# Patient Record
Sex: Female | Born: 1965 | Race: Black or African American | Hispanic: No | Smoking: Former smoker
Health system: Southern US, Community
[De-identification: ages and names within clinical notes are randomized; demographics above are authoritative.]

## PROBLEM LIST (undated history)

## (undated) DIAGNOSIS — D860 Sarcoidosis of lung: Secondary | ICD-10-CM

## (undated) DIAGNOSIS — F410 Panic disorder [episodic paroxysmal anxiety] without agoraphobia: Secondary | ICD-10-CM

## (undated) DIAGNOSIS — K209 Esophagitis, unspecified without bleeding: Secondary | ICD-10-CM

## (undated) DIAGNOSIS — M549 Dorsalgia, unspecified: Secondary | ICD-10-CM

## (undated) DIAGNOSIS — E669 Obesity, unspecified: Secondary | ICD-10-CM

## (undated) DIAGNOSIS — F419 Anxiety disorder, unspecified: Secondary | ICD-10-CM

## (undated) DIAGNOSIS — E785 Hyperlipidemia, unspecified: Secondary | ICD-10-CM

## (undated) DIAGNOSIS — N183 Chronic kidney disease, stage 3 unspecified: Secondary | ICD-10-CM

## (undated) DIAGNOSIS — K635 Polyp of colon: Secondary | ICD-10-CM

## (undated) DIAGNOSIS — G4730 Sleep apnea, unspecified: Secondary | ICD-10-CM

## (undated) DIAGNOSIS — F32A Depression, unspecified: Secondary | ICD-10-CM

## (undated) DIAGNOSIS — K219 Gastro-esophageal reflux disease without esophagitis: Secondary | ICD-10-CM

## (undated) DIAGNOSIS — R945 Abnormal results of liver function studies: Secondary | ICD-10-CM

## (undated) DIAGNOSIS — J449 Chronic obstructive pulmonary disease, unspecified: Secondary | ICD-10-CM

## (undated) DIAGNOSIS — I509 Heart failure, unspecified: Secondary | ICD-10-CM

## (undated) DIAGNOSIS — K2970 Gastritis, unspecified, without bleeding: Secondary | ICD-10-CM

## (undated) DIAGNOSIS — I10 Essential (primary) hypertension: Secondary | ICD-10-CM

## (undated) DIAGNOSIS — R51 Headache: Secondary | ICD-10-CM

## (undated) DIAGNOSIS — N83209 Unspecified ovarian cyst, unspecified side: Secondary | ICD-10-CM

## (undated) DIAGNOSIS — M797 Fibromyalgia: Secondary | ICD-10-CM

## (undated) DIAGNOSIS — D126 Benign neoplasm of colon, unspecified: Secondary | ICD-10-CM

## (undated) DIAGNOSIS — F329 Major depressive disorder, single episode, unspecified: Secondary | ICD-10-CM

## (undated) DIAGNOSIS — G8929 Other chronic pain: Secondary | ICD-10-CM

## (undated) DIAGNOSIS — K648 Other hemorrhoids: Secondary | ICD-10-CM

## (undated) DIAGNOSIS — R519 Headache, unspecified: Secondary | ICD-10-CM

## (undated) DIAGNOSIS — K760 Fatty (change of) liver, not elsewhere classified: Secondary | ICD-10-CM

## (undated) HISTORY — DX: Unspecified ovarian cyst, unspecified side: N83.209

## (undated) HISTORY — DX: Other chronic pain: G89.29

## (undated) HISTORY — DX: Headache: R51

## (undated) HISTORY — DX: Benign neoplasm of colon, unspecified: D12.6

## (undated) HISTORY — DX: Hyperlipidemia, unspecified: E78.5

## (undated) HISTORY — DX: Esophagitis, unspecified without bleeding: K20.90

## (undated) HISTORY — DX: Essential (primary) hypertension: I10

## (undated) HISTORY — DX: Abnormal results of liver function studies: R94.5

## (undated) HISTORY — DX: Fatty (change of) liver, not elsewhere classified: K76.0

## (undated) HISTORY — DX: Esophagitis, unspecified: K20.9

## (undated) HISTORY — DX: Other hemorrhoids: K64.8

## (undated) HISTORY — DX: Sleep apnea, unspecified: G47.30

## (undated) HISTORY — DX: Chronic obstructive pulmonary disease, unspecified: J44.9

## (undated) HISTORY — PX: OTHER SURGICAL HISTORY: SHX169

## (undated) HISTORY — PX: BACK SURGERY: SHX140

## (undated) HISTORY — DX: Gastro-esophageal reflux disease without esophagitis: K21.9

## (undated) HISTORY — DX: Headache, unspecified: R51.9

## (undated) HISTORY — PX: CARPAL TUNNEL RELEASE: SHX101

## (undated) HISTORY — DX: Fibromyalgia: M79.7

## (undated) HISTORY — DX: Polyp of colon: K63.5

## (undated) HISTORY — DX: Anxiety disorder, unspecified: F41.9

## (undated) HISTORY — DX: Sarcoidosis of lung: D86.0

## (undated) HISTORY — PX: DILATION AND CURETTAGE OF UTERUS: SHX78

## (undated) HISTORY — DX: Gastritis, unspecified, without bleeding: K29.70

## (undated) HISTORY — DX: Heart failure, unspecified: I50.9

---

## 2008-01-02 ENCOUNTER — Encounter: Payer: Self-pay | Admitting: Pulmonary Disease

## 2008-01-03 ENCOUNTER — Encounter: Payer: Self-pay | Admitting: Pulmonary Disease

## 2008-05-15 ENCOUNTER — Encounter: Payer: Self-pay | Admitting: Pulmonary Disease

## 2008-09-25 LAB — CONVERTED CEMR LAB

## 2009-09-25 HISTORY — PX: POLYPECTOMY: SHX149

## 2009-11-18 ENCOUNTER — Encounter: Payer: Self-pay | Admitting: Pulmonary Disease

## 2009-11-23 LAB — HM COLONOSCOPY

## 2009-12-03 DIAGNOSIS — D126 Benign neoplasm of colon, unspecified: Secondary | ICD-10-CM

## 2009-12-03 HISTORY — DX: Benign neoplasm of colon, unspecified: D12.6

## 2010-01-03 ENCOUNTER — Emergency Department (HOSPITAL_COMMUNITY): Admission: EM | Admit: 2010-01-03 | Discharge: 2010-01-04 | Payer: Self-pay | Admitting: Emergency Medicine

## 2010-01-19 ENCOUNTER — Encounter: Payer: Self-pay | Admitting: Pulmonary Disease

## 2010-01-19 ENCOUNTER — Encounter: Admission: RE | Admit: 2010-01-19 | Discharge: 2010-01-19 | Payer: Self-pay | Admitting: Cardiovascular Disease

## 2010-01-24 ENCOUNTER — Encounter: Payer: Self-pay | Admitting: Pulmonary Disease

## 2010-03-01 ENCOUNTER — Encounter: Admission: RE | Admit: 2010-03-01 | Discharge: 2010-03-01 | Payer: Self-pay | Admitting: Cardiovascular Disease

## 2010-03-01 ENCOUNTER — Inpatient Hospital Stay (HOSPITAL_COMMUNITY): Admission: AD | Admit: 2010-03-01 | Discharge: 2010-03-11 | Payer: Self-pay | Admitting: Cardiovascular Disease

## 2010-03-06 LAB — CONVERTED CEMR LAB
ALT: 28 units/L
AST: 29 units/L
Albumin: 3.4 g/dL
Alkaline Phosphatase: 135 units/L
BUN: 10 mg/dL
CO2: 25 meq/L
Calcium: 8.8 mg/dL
Chloride: 104 meq/L
Cholesterol: 189 mg/dL
Creatinine, Ser: 1.03 mg/dL
Glucose, Bld: 113 mg/dL
HDL: 79 mg/dL
LDL Cholesterol: 92 mg/dL
Potassium: 4 meq/L
Sodium: 135 meq/L
TSH: 0.637 microintl units/mL
Total Bilirubin: 0.6 mg/dL
Total Protein: 7.3 g/dL
Triglyceride fasting, serum: 89 mg/dL

## 2010-03-07 ENCOUNTER — Encounter: Payer: Self-pay | Admitting: Pulmonary Disease

## 2010-04-06 ENCOUNTER — Encounter: Payer: Self-pay | Admitting: Pulmonary Disease

## 2010-04-26 ENCOUNTER — Ambulatory Visit: Payer: Self-pay | Admitting: Pulmonary Disease

## 2010-04-26 DIAGNOSIS — F411 Generalized anxiety disorder: Secondary | ICD-10-CM | POA: Insufficient documentation

## 2010-04-26 DIAGNOSIS — D649 Anemia, unspecified: Secondary | ICD-10-CM | POA: Insufficient documentation

## 2010-04-26 DIAGNOSIS — D869 Sarcoidosis, unspecified: Secondary | ICD-10-CM

## 2010-04-26 DIAGNOSIS — I10 Essential (primary) hypertension: Secondary | ICD-10-CM

## 2010-04-26 DIAGNOSIS — G4730 Sleep apnea, unspecified: Secondary | ICD-10-CM | POA: Insufficient documentation

## 2010-04-26 DIAGNOSIS — R05 Cough: Secondary | ICD-10-CM

## 2010-04-26 DIAGNOSIS — R519 Headache, unspecified: Secondary | ICD-10-CM | POA: Insufficient documentation

## 2010-04-26 DIAGNOSIS — R0989 Other specified symptoms and signs involving the circulatory and respiratory systems: Secondary | ICD-10-CM

## 2010-04-26 DIAGNOSIS — E785 Hyperlipidemia, unspecified: Secondary | ICD-10-CM

## 2010-04-26 DIAGNOSIS — R51 Headache: Secondary | ICD-10-CM

## 2010-04-26 DIAGNOSIS — R0609 Other forms of dyspnea: Secondary | ICD-10-CM

## 2010-04-27 ENCOUNTER — Ambulatory Visit: Payer: Self-pay | Admitting: Cardiology

## 2010-04-29 ENCOUNTER — Telehealth (INDEPENDENT_AMBULATORY_CARE_PROVIDER_SITE_OTHER): Payer: Self-pay | Admitting: *Deleted

## 2010-05-09 ENCOUNTER — Encounter
Admission: RE | Admit: 2010-05-09 | Discharge: 2010-08-07 | Payer: Self-pay | Admitting: Physical Medicine & Rehabilitation

## 2010-05-09 ENCOUNTER — Telehealth (INDEPENDENT_AMBULATORY_CARE_PROVIDER_SITE_OTHER): Payer: Self-pay | Admitting: *Deleted

## 2010-05-16 ENCOUNTER — Ambulatory Visit (HOSPITAL_COMMUNITY)
Admission: RE | Admit: 2010-05-16 | Discharge: 2010-05-16 | Payer: Self-pay | Admitting: Physical Medicine & Rehabilitation

## 2010-05-16 ENCOUNTER — Ambulatory Visit: Payer: Self-pay | Admitting: Physical Medicine & Rehabilitation

## 2010-05-16 ENCOUNTER — Emergency Department (HOSPITAL_COMMUNITY): Admission: EM | Admit: 2010-05-16 | Discharge: 2010-05-16 | Payer: Self-pay | Admitting: Emergency Medicine

## 2010-05-18 ENCOUNTER — Ambulatory Visit: Payer: Self-pay | Admitting: Pulmonary Disease

## 2010-05-25 ENCOUNTER — Telehealth: Payer: Self-pay | Admitting: Pulmonary Disease

## 2010-06-06 ENCOUNTER — Ambulatory Visit: Payer: Self-pay | Admitting: Pulmonary Disease

## 2010-06-10 ENCOUNTER — Encounter: Payer: Self-pay | Admitting: Pulmonary Disease

## 2010-06-22 ENCOUNTER — Ambulatory Visit: Payer: Self-pay | Admitting: Pulmonary Disease

## 2010-06-22 ENCOUNTER — Inpatient Hospital Stay (HOSPITAL_COMMUNITY): Admission: AD | Admit: 2010-06-22 | Discharge: 2010-06-28 | Payer: Self-pay | Admitting: Cardiovascular Disease

## 2010-06-22 ENCOUNTER — Encounter: Admission: RE | Admit: 2010-06-22 | Discharge: 2010-06-22 | Payer: Self-pay | Admitting: Cardiovascular Disease

## 2010-06-22 ENCOUNTER — Encounter: Payer: Self-pay | Admitting: Internal Medicine

## 2010-07-05 ENCOUNTER — Telehealth: Payer: Self-pay | Admitting: Pulmonary Disease

## 2010-07-13 ENCOUNTER — Encounter: Payer: Self-pay | Admitting: Internal Medicine

## 2010-07-18 ENCOUNTER — Telehealth: Payer: Self-pay | Admitting: Pulmonary Disease

## 2010-07-27 ENCOUNTER — Telehealth (INDEPENDENT_AMBULATORY_CARE_PROVIDER_SITE_OTHER): Payer: Self-pay | Admitting: *Deleted

## 2010-07-27 ENCOUNTER — Ambulatory Visit: Payer: Self-pay | Admitting: Pulmonary Disease

## 2010-08-03 ENCOUNTER — Encounter: Payer: Self-pay | Admitting: Pulmonary Disease

## 2010-08-15 ENCOUNTER — Encounter: Payer: Self-pay | Admitting: Pulmonary Disease

## 2010-08-16 ENCOUNTER — Telehealth (INDEPENDENT_AMBULATORY_CARE_PROVIDER_SITE_OTHER): Payer: Self-pay | Admitting: *Deleted

## 2010-08-17 ENCOUNTER — Ambulatory Visit: Payer: Self-pay

## 2010-08-17 ENCOUNTER — Ambulatory Visit: Payer: Self-pay | Admitting: Pulmonary Disease

## 2010-08-17 DIAGNOSIS — J9610 Chronic respiratory failure, unspecified whether with hypoxia or hypercapnia: Secondary | ICD-10-CM

## 2010-08-18 ENCOUNTER — Emergency Department (HOSPITAL_COMMUNITY): Admission: EM | Admit: 2010-08-18 | Discharge: 2010-08-18 | Payer: Self-pay | Admitting: Pediatrics

## 2010-08-18 LAB — CONVERTED CEMR LAB
Basophils Relative: 1 %
Eosinophils Relative: 7 %
HCT: 41.9 %
Hemoglobin: 13.5 g/dL
Lymphocytes, automated: 27 %
MCV: 78 fL
Monocytes Relative: 14 %
Neutrophils Relative %: 52 %
Platelets: 158 10*3/uL
RBC: 5.37 M/uL
RDW: 17.2 %
WBC: 4.3 10*3/uL

## 2010-08-26 ENCOUNTER — Ambulatory Visit: Payer: Self-pay | Admitting: Internal Medicine

## 2010-08-26 DIAGNOSIS — N912 Amenorrhea, unspecified: Secondary | ICD-10-CM | POA: Insufficient documentation

## 2010-08-26 DIAGNOSIS — J309 Allergic rhinitis, unspecified: Secondary | ICD-10-CM | POA: Insufficient documentation

## 2010-08-26 DIAGNOSIS — F17200 Nicotine dependence, unspecified, uncomplicated: Secondary | ICD-10-CM | POA: Insufficient documentation

## 2010-08-26 DIAGNOSIS — M797 Fibromyalgia: Secondary | ICD-10-CM | POA: Insufficient documentation

## 2010-08-26 DIAGNOSIS — I5033 Acute on chronic diastolic (congestive) heart failure: Secondary | ICD-10-CM

## 2010-08-26 DIAGNOSIS — K219 Gastro-esophageal reflux disease without esophagitis: Secondary | ICD-10-CM | POA: Insufficient documentation

## 2010-08-31 ENCOUNTER — Ambulatory Visit: Payer: Self-pay | Admitting: Pulmonary Disease

## 2010-09-08 ENCOUNTER — Ambulatory Visit: Payer: Self-pay | Admitting: Internal Medicine

## 2010-09-09 LAB — CONVERTED CEMR LAB
BUN: 12 mg/dL (ref 6–23)
CO2: 25 meq/L (ref 19–32)
Calcium: 8.8 mg/dL (ref 8.4–10.5)
Chloride: 101 meq/L (ref 96–112)
Creatinine, Ser: 0.9 mg/dL (ref 0.4–1.2)
GFR calc non Af Amer: 86.53 mL/min (ref >60.00)
Glucose, Bld: 93 mg/dL (ref 70–99)
Potassium: 4 meq/L (ref 3.5–5.1)
Sodium: 139 meq/L (ref 135–145)

## 2010-09-13 ENCOUNTER — Telehealth: Payer: Self-pay | Admitting: Pulmonary Disease

## 2010-09-14 ENCOUNTER — Telehealth: Payer: Self-pay | Admitting: Internal Medicine

## 2010-09-20 ENCOUNTER — Telehealth: Payer: Self-pay | Admitting: Internal Medicine

## 2010-09-22 ENCOUNTER — Ambulatory Visit: Admit: 2010-09-22 | Payer: Self-pay | Admitting: Pulmonary Disease

## 2010-09-30 ENCOUNTER — Emergency Department (HOSPITAL_COMMUNITY)
Admission: EM | Admit: 2010-09-30 | Discharge: 2010-09-30 | Payer: Self-pay | Source: Home / Self Care | Admitting: Emergency Medicine

## 2010-09-30 ENCOUNTER — Encounter: Payer: Self-pay | Admitting: Pulmonary Disease

## 2010-09-30 LAB — CONVERTED CEMR LAB
Creatinine, Ser: 1 mg/dL
Glucose, Bld: 93 mg/dL
Neutrophils Relative %: 55 %
Platelets: 147 10*3/uL
RBC: 5.85 M/uL
RDW: 16.4 %
Sodium: 138 meq/L

## 2010-10-10 LAB — POCT I-STAT, CHEM 8
BUN: 10 mg/dL (ref 6–23)
Calcium, Ion: 0.96 mmol/L — ABNORMAL LOW (ref 1.12–1.32)
Chloride: 103 mEq/L (ref 96–112)
Creatinine, Ser: 1 mg/dL (ref 0.4–1.2)
Glucose, Bld: 93 mg/dL (ref 70–99)
HCT: 45 % (ref 36.0–46.0)
Hemoglobin: 15.3 g/dL — ABNORMAL HIGH (ref 12.0–15.0)
Potassium: 3.5 mEq/L (ref 3.5–5.1)
Sodium: 138 mEq/L (ref 135–145)
TCO2: 30 mmol/L (ref 0–100)

## 2010-10-10 LAB — POCT CARDIAC MARKERS
CKMB, poc: 1.1 ng/mL (ref 1.0–8.0)
Myoglobin, poc: 116 ng/mL (ref 12–200)
Troponin i, poc: <0.05 ng/mL (ref 0.00–0.09)

## 2010-10-10 LAB — CBC
HCT: 45.2 % (ref 36.0–46.0)
Hemoglobin: 14.7 g/dL (ref 12.0–15.0)
MCH: 25.1 pg — ABNORMAL LOW (ref 26.0–34.0)
MCHC: 32.5 g/dL (ref 30.0–36.0)
MCV: 77.3 fL — ABNORMAL LOW (ref 78.0–100.0)
Platelets: 147 10*3/uL — ABNORMAL LOW (ref 150–400)
RBC: 5.85 MIL/uL — ABNORMAL HIGH (ref 3.87–5.11)
RDW: 16.4 % — ABNORMAL HIGH (ref 11.5–15.5)
WBC: 4.2 10*3/uL (ref 4.0–10.5)

## 2010-10-10 LAB — DIFFERENTIAL
Basophils Absolute: 0 10*3/uL (ref 0.0–0.1)
Basophils Relative: 1 % (ref 0–1)
Eosinophils Absolute: 0.2 10*3/uL (ref 0.0–0.7)
Eosinophils Relative: 6 % — ABNORMAL HIGH (ref 0–5)
Lymphocytes Relative: 30 % (ref 12–46)
Lymphs Abs: 1.3 10*3/uL (ref 0.7–4.0)
Monocytes Absolute: 0.4 10*3/uL (ref 0.1–1.0)
Monocytes Relative: 8 % (ref 3–12)
Neutro Abs: 2.3 10*3/uL (ref 1.7–7.7)
Neutrophils Relative %: 55 % (ref 43–77)

## 2010-10-20 ENCOUNTER — Ambulatory Visit
Admission: RE | Admit: 2010-10-20 | Discharge: 2010-10-20 | Payer: Self-pay | Source: Home / Self Care | Attending: Internal Medicine | Admitting: Internal Medicine

## 2010-10-20 ENCOUNTER — Ambulatory Visit
Admission: RE | Admit: 2010-10-20 | Discharge: 2010-10-20 | Payer: Self-pay | Source: Home / Self Care | Attending: Pulmonary Disease | Admitting: Pulmonary Disease

## 2010-10-20 DIAGNOSIS — J441 Chronic obstructive pulmonary disease with (acute) exacerbation: Secondary | ICD-10-CM | POA: Insufficient documentation

## 2010-10-27 ENCOUNTER — Telehealth: Payer: Self-pay | Admitting: Internal Medicine

## 2010-10-27 NOTE — Progress Notes (Signed)
Summary: pna   Phone Note Call from Patient Call back at [phone]   Caller: Patient Call For: clance Summary of Call: pt have had pnuemonia3 times this year and she has it again. would like to talk to dr clance. Initial call taken by: Rickard Patience,  August 16, 2010 9:16 AM  Follow-up for Phone Call        Called, spoke with pt.  Per pt, she was seen at Urgent Care yesterday and was diagnosed with RLL PNA.  States she was given and injection and sent home with zpak and another abx.  Pt states she called her PCP but was told to call Dr. Shelle Iron as this is the 3rd time this year she has been diagnosed with pna.  Pt states she had a "hard time breathing last night."  OV offered today but pt declined d/t transportation issues.  Pt aware KC is out of the office today.  OV scheduled with [facility] for tomorrow morning at 11:15am -- pt aware and advised 911/ER is sxs worsen.  She verbalized understanding. Follow-up by: Gweneth Dimitri RN,  August 16, 2010 9:42 AM

## 2010-10-27 NOTE — Progress Notes (Signed)
Summary: medication, issue  Phone Note Call from Patient Call back at Home Phone [phone]   Caller: Patient Call For: clance Summary of Call: Pt states she was given diltiazem 60mg  by Dr.Kadakia on 8/12, and took her first dose on 8/13. C/O sob, thinks it may be a reaction to this med pls advise.//[email or web address] Initial call taken by: Darletta Moll,  May 09, 2010 3:28 PM  Follow-up for Phone Call        Spoke with pt; she is aware to call the dr that gave her the med and see what they recommend. If any urgent matters of SOB to address and she feels the need to go to the ER-then go.Reynaldo Minium CMA  May 09, 2010 3:58 PM

## 2010-10-27 NOTE — Letter (Signed)
SummaryTechnical sales engineer / Pediatric Administrator, arts / [facility]   Imported By: Lennie Odor 10/11/2010 10:49:49  _____________________________________________________________________  External Attachment:    Type:   Image     Comment:   External Document

## 2010-10-27 NOTE — Progress Notes (Signed)
Summary: nos appt  Phone Note Call from Patient   Caller: [email or web address]  Call For: clance Summary of Call: Rsc nos from 12/19 to 1/5. Initial call taken by: Darletta Moll,  September 13, 2010 12:52 PM

## 2010-10-27 NOTE — Miscellaneous (Signed)
Summary: Controlled Substances Contract / [facility]  Controlled Substances Contract / [facility]   Imported By: Lennie Odor 08/29/2010 15:08:43  _____________________________________________________________________  External Attachment:    Type:   Image     Comment:   External Document

## 2010-10-27 NOTE — Letter (Signed)
Summary: [facility]   Imported By: Lester [place] 05/13/2010 10:47:36  _____________________________________________________________________  External Attachment:    Type:   Image     Comment:   External Document

## 2010-10-27 NOTE — Letter (Signed)
Summary: [facility]  [facility]   Imported By: Lester [place] 05/05/2010 10:18:26  _____________________________________________________________________  External Attachment:    Type:   Image     Comment:   External Document

## 2010-10-27 NOTE — Letter (Signed)
Summary: CMN for Oxygen/[facility]  CMN for Oxygen/[facility]   Imported By: Sherian Rein 08/08/2010 11:42:11  _____________________________________________________________________  External Attachment:    Type:   Image     Comment:   External Document

## 2010-10-27 NOTE — Assessment & Plan Note (Signed)
Summary: rov for cough, copd, sarcoid   Copy to:  Dr. Anson Fret Primary Anival Pasha/Referring Zareth Rippetoe:  Dr. Anson Fret  CC:  Pt is here for a 4 week f/u appt.  Pt states she is still smoking less than 1/2 ppd.  Pt states breathing has imrpoved since switching from Advair to Symbicort.  Pt states she went to [facility] ED 2 days ago d/t increased coughing and was started on abx.  Pt states she will cough up thick clear sputum. Marland Kitchen  History of Present Illness: the pt comes in today for f/u of her known sarcoid, copd, and cough.  She has had a recent ct chest which showed no mediastinal LN, no ISLD, changes of emphysema, and a few scattered tiny pulmonary nodules.  At the last visit, she was changed to symbicort from advair to see if it would help her cough.  She felt it helped her breathing, but did not change her cough much.  She went to the ER recently for her cough, and was started on an abx emperically.  Currently, she is continuing to cough with clear mucus.  She is continuing to smoke 1/2 ppd.   Preventive Screening-Counseling & Management  Alcohol-Tobacco     Smoking Status: current     Smoking Cessation Counseling: yes     Packs/Day: 0.5     Tobacco Counseling: to quit use of tobacco products  Current Medications (verified): 1)  Albuterol Sulfate (2.5 Mg/33ml) 0.083% Nebu (Albuterol Sulfate) .... Qid As Needed 2)  Spiriva Handihaler 18 Mcg Caps (Tiotropium Bromide Monohydrate) .... Once Daily 3)  Symbicort 160-4.5 Mcg/act  Aero (Budesonide-Formoterol Fumarate) .... Two Puffs Twice Daily 4)  Ventolin Hfa 108 (90 Base) Mcg/act Aers (Albuterol Sulfate) .... As Needed 5)  Ferrous Sulfate 325 (65 Fe) Mg Tabs (Ferrous Sulfate) .... Take 1 Tablet By Mouth Two Times A Day 6)  Clonazepam 1 Mg Tabs (Clonazepam) .... Take 1 Tablet By Mouth Two Times A Day 7)  Klor-Con 10 10 Meq Cr-Tabs (Potassium Chloride) .... Take 1 Tablet By Mouth Two Times A Day 8)  Cyclobenzaprine Hcl 5 Mg Tabs (Cyclobenzaprine Hcl) ....  Take 1 Tablet By Mouth Three Times A Day 9)  Oxycodone-Acetaminophen 5-325 Mg Tabs (Oxycodone-Acetaminophen) .... Take 1 Tablet By Mouth Two Times A Day 10)  Omeprazole 40 Mg Cpdr (Omeprazole) .... Take 1 Tablet By Mouth Two Times A Day 11)  Furosemide 40 Mg Tabs (Furosemide) .... Take 1 Tablet By Mouth Two Times A Day 12)  Amoxicillin 875 Mg Tabs (Amoxicillin) .... Take 1 Tablet By Mouth Two Times A Day 13)  Tramadol Hcl 50 Mg Tabs (Tramadol Hcl) .... Take 1 Tablet By Mouth Three Times A Day 14)  Diltiazem Hcl 60 Mg Tabs (Diltiazem Hcl) .... Take 1 Tablet By Mouth Two Times A Day  Allergies (verified): 1)  ! Bactrim 2)  ! Doxycycline 3)  ! Zithromax 4)  Cipro 5)  * Latex  Past History:  Past medical, surgical, family and social histories (including risk factors) reviewed, and no changes noted (except as noted below).  Past Medical History: Reviewed history from 04/26/2010 and no changes required. HEADACHE, CHRONIC (ICD-784.0) SLEEP APNEA (ICD-780.57) HYPERLIPIDEMIA (ICD-272.4) ANXIETY (ICD-300.00) ANEMIA (ICD-285.9) PULMONARY SARCOIDOSIS (ICD-135) COPD (ICD-496)--?due to sarcoid, vs smoking. HYPERTENSION (ICD-401.9)  Past Surgical History: Reviewed history from 04/26/2010 and no changes required. polyp removed - 2011  Family History: Reviewed history from 04/26/2010 and no changes required. emphysema-father allergies-brother heart disease-mother, father, brother stomach cancer-father, brother  Social History: Reviewed  history from 04/26/2010 and no changes required. Patient is a current smoker.  1ppd x 40yrs.  Currently down to <1/2ppd. lives with sister seperated no children disability Packs/Day:  0.5  Review of Systems       The patient complains of productive cough, chest pain, nasal congestion/difficulty breathing through nose, and hand/feet swelling.  The patient denies shortness of breath with activity, shortness of breath at rest, non-productive cough,  coughing up blood, irregular heartbeats, acid heartburn, indigestion, loss of appetite, weight change, abdominal pain, difficulty swallowing, sore throat, tooth/dental problems, headaches, sneezing, itching, ear ache, anxiety, depression, joint stiffness or pain, rash, change in color of mucus, and fever.    Vital Signs:  Patient profile:   45 year old female Height:      68 inches Weight:      284.38 pounds BMI:     43.40 O2 Sat:      90 % on Room air Temp:     98.7 degrees F oral Pulse rate:   84 / minute BP sitting:   120 / 72  (left arm) Cuff size:   large  Vitals Entered By: Arman Filter LPN (May 18, 2010 10:39 AM)  O2 Flow:  Room air  CC: Pt is here for a 4 week f/u appt.  Pt states she is still smoking less than 1/2 ppd.  Pt states breathing has imrpoved since switching from Advair to Symbicort.  Pt states she went to [facility] ED 2 days ago d/t increased coughing and was started on abx.  Pt states she will cough up thick clear sputum.  Comments Medications reviewed with patient Arman Filter LPN  May 18, 2010 10:45 AM    Physical Exam  General:  obese female in nad Nose:  no purulence or obstruction, +turbinate hypertrophy Lungs:  poor depth of inspiration, o/w clear Heart:  rrr, no mrg Extremities:  mild ankle edema, no cyanosis  Neurologic:  alert and oriented, moves all 4.   Impression & Recommendations:  Problem # 1:  PULMONARY SARCOIDOSIS (ICD-135) the pt has no significant LN or ISLD to suggest active sarcoid.  Problem # 2:  COPD (ICD-496) the pt has known obstructive airways disease and continues to smoke.  I have stressed to her the need to quit smoking, and to continue with her bronchodilator regimen.  Will schedule for full pfts to compare to those 2 years ago.  Problem # 3:  COUGH (ICD-786.2) the pt continues to have a cough, but I think it is from her upper airway and ongoing smoking.  She is having significant postnasal drip, and will treat this more  aggressively.  I do not think this is due to airway disease from her sarcoid.  Medications Added to Medication List This Visit: 1)  Symbicort 160-4.5 Mcg/act Aero (Budesonide-formoterol fumarate) .... Two puffs twice daily 2)  Cyclobenzaprine Hcl 5 Mg Tabs (Cyclobenzaprine hcl) .... Take 1 tablet by mouth three times a day 3)  Amoxicillin 875 Mg Tabs (Amoxicillin) .... Take 1 tablet by mouth two times a day 4)  Tramadol Hcl 50 Mg Tabs (Tramadol hcl) .... Take 1 tablet by mouth three times a day 5)  Diltiazem Hcl 60 Mg Tabs (Diltiazem hcl) .... Take 1 tablet by mouth two times a day 6)  Zyrtec Allergy 10 Mg Tabs (Cetirizine hcl) .... Take 1 tablet by mouth once a day  Other Orders: Est. Patient Level IV (14782) Tobacco use cessation intermediate 3-10 minutes (95621) Pulmonary Referral (Pulmonary)  Patient Instructions:  1)  stay on symbicort and spiriva 2)  start zyrtec 10mg  over the counter each night at bedtime for postnasal drip. 3)  trial of omnaris 2 sprays each nostril each am ot see if it will help postnasal drip.  If it does, can call in prescription. 4)  stop smoking...this is the best treatment for your cough 5)  work on weight loss and conditioning. 6)  will schedule for breathing tests, and will let you know the results. 7)  followup with me in 3 mos, or sooner if problems.  Prescriptions: ZYRTEC ALLERGY 10 MG TABS (CETIRIZINE HCL) Take 1 tablet by mouth once a day  #30 x 3   Entered by:   Arman Filter LPN   Authorized by:   Barbaraann Share MD   Signed by:   Arman Filter LPN on 16/06/9603   Method used:   Print then Give to Patient   RxID:   [id number] SYMBICORT 160-4.5 MCG/ACT  AERO (BUDESONIDE-FORMOTEROL FUMARATE) Two puffs twice daily  #1 x 3   Entered by:   Arman Filter LPN   Authorized by:   Barbaraann Share MD   Signed by:   Arman Filter LPN on 21/30/8657   Method used:   Print then Give to Patient   RxID:   [id number]

## 2010-10-27 NOTE — Progress Notes (Signed)
Summary: scooter forms  Phone Note Other Incoming Call back at [phone] ext [phone]   Caller: kathy//[employer] Summary of Call: Wants to know if [facility] received forms for pt's scooter. Initial call taken by: Darletta Moll,  July 27, 2010 2:34 PM  Follow-up for Phone Call        called and spoke with [facility] and informed her that [facility] does not fill out scooter forms that they need to go to the pt's  pcp. She stated she understood and will fax forms to pcp.  Carver Fila  July 27, 2010 3:22 PM

## 2010-10-27 NOTE — Miscellaneous (Signed)
Summary: Orders Update  Clinical Lists Changes  Orders: Added new Test order of Venous Duplex Lower Extremity (Venous Duplex Lower) - Signed  Appended Document: Orders Update I have received a flag noting that LE dopplers negative for blood clots.  please let pt know.  increase the furosemide as I directed, and need a followup with your primary md to adjust dose further.  Appended Document: Orders Update called and spoke with patient and she verbalized understanding and had no questions

## 2010-10-27 NOTE — Progress Notes (Signed)
Summary: med ?  Phone Note Call from Patient Call back at Home Phone [phone]   Caller: Patient Summary of Call: Pt is looking for MD's advisement on whether or not she can using the nicotine patch along with taking Chantix-please advise Initial call taken by: Brenton Grills CMA Duncan Dull),  September 14, 2010 4:27 PM  Follow-up for Phone Call        may have increase n/v or headaches if taking both at same time - try using one or the other - thanks Follow-up by: Newt Lukes MD,  September 14, 2010 4:40 PM  Additional Follow-up for Phone Call Additional follow up Details #1::        left message of MD's advisement on VM Additional Follow-up by: Brenton Grills CMA Duncan Dull),  September 15, 2010 8:17 AM

## 2010-10-27 NOTE — Assessment & Plan Note (Signed)
Summary: acute sick visit for sob and ?pna   Primary Provider/Referring Provider:  Dr. Algie Coffer  CC:  Acute Visit.  Was seen at UC x 2 days ago for RLL pna.  Was reccomended by PCP to f/u with pulmonary as this is the 3rd time this year she has been diagnosed with pna.  increased SOB, nonprod cough, achy pains in chest, hoarseness, sweats, and chills - onset this past weekend.  Cough and SOB worse when laying down.  Requesting new nebulizer.  History of Present Illness: the pt comes in today for an acute sick visit.  She has known sarcoidosis, although her ct this year shows no ISLD and no significant LN.  She also has moderate airflow obstruction that I suspect is due to emphysema, and possibly OHS.  She comes in today after being evaluated at urgent care 2 days ago and told she had RLL pna.  However, her xray is not available to me currently, and they did not have xrays for comparison.  The pt has been having worsening sob, cough, sweats, and chills.  No purulence noted.  She currently is on omnicef and zpak per urgent care visit.  She does feel better, but is still sob.  It should be noted that her weight is up 8 pounds since her visit 3 weeks ago, and she has significant LE edema.  Her sats today are quite poor, but she is not wearing her oxygen as prescribed.  Current Medications (verified): 1)  Albuterol Sulfate (2.5 Mg/26ml) 0.083% Nebu (Albuterol Sulfate) .... Qid As Needed 2)  Spiriva Handihaler 18 Mcg Caps (Tiotropium Bromide Monohydrate) .... Once Daily 3)  Symbicort 160-4.5 Mcg/act  Aero (Budesonide-Formoterol Fumarate) .... Two Puffs Twice Daily 4)  Ventolin Hfa 108 (90 Base) Mcg/act Aers (Albuterol Sulfate) .... As Needed 5)  Klor-Con 10 10 Meq Cr-Tabs (Potassium Chloride) .... Take 1 Tablet By Mouth Two Times A Day 6)  Oxycodone-Acetaminophen 5-325 Mg Tabs (Oxycodone-Acetaminophen) .... Take 1 Tablet By Mouth Two Times A Day 7)  Omeprazole 40 Mg Cpdr (Omeprazole) .... Take 1 Capsule By  Mouth Once A Day 8)  Furosemide 40 Mg Tabs (Furosemide) .... Take 1 Tablet By Mouth Once A Day 9)  Diltiazem Hcl 60 Mg Tabs (Diltiazem Hcl) .... Take 1 Tablet By Mouth Two Times A Day 10)  Zyrtec Allergy 10 Mg Tabs (Cetirizine Hcl) .... Take 1 Tablet By Mouth Once A Day 11)  Xanax 0.5 Mg Tabs (Alprazolam) .Marland Kitchen.. 1 Tablet Two Times A Day 12)  Cefdinir 300 Mg Caps (Cefdinir) .... Take 1 Capsule By Mouth Two Times A Day 13)  Zithromax Z-Pak 250 Mg Tabs (Azithromycin) .... As Directed  Allergies (verified): 1)  ! Bactrim 2)  ! Doxycycline 3)  ! Zithromax 4)  Cipro 5)  * Latex  Past History:  Past medical, surgical, family and social histories (including risk factors) reviewed, and no changes noted (except as noted below).  Past Medical History: HEADACHE, CHRONIC (ICD-784.0) SLEEP APNEA (ICD-780.57) HYPERLIPIDEMIA (ICD-272.4) ANXIETY (ICD-300.00) ANEMIA (ICD-285.9) PULMONARY SARCOIDOSIS (ICD-135)--unimpressive ct chest 2011 COPD (ICD-496)--moderate airflow obstruction, suspect is due to emphysema HYPERTENSION (ICD-401.9)  Past Surgical History: Reviewed history from 04/26/2010 and no changes required. polyp removed - 2011  Family History: Reviewed history from 04/26/2010 and no changes required. emphysema-father allergies-brother heart disease-mother, father, brother stomach cancer-father, brother  Social History: Reviewed history from 04/26/2010 and no changes required. Patient is a current smoker.  1ppd x 78yrs.  Currently down to <1/2ppd. lives with sister seperated  no children disability  Review of Systems       The patient complains of shortness of breath with activity, shortness of breath at rest, non-productive cough, chest pain, acid heartburn, weight change, abdominal pain, difficulty swallowing, headaches, nasal congestion/difficulty breathing through nose, itching, anxiety, hand/feet swelling, joint stiffness or pain, and rash.  The patient denies productive  cough, coughing up blood, irregular heartbeats, indigestion, loss of appetite, sore throat, tooth/dental problems, sneezing, ear ache, depression, change in color of mucus, and fever.    Vital Signs:  Patient profile:   45 year old female Height:      68.5 inches Weight:      304.50 pounds BMI:     45.79 O2 Sat:      75 % on Room air Temp:     98.1 degrees F oral Pulse rate:   95 / minute BP sitting:   114 / 86  (right arm) Cuff size:   large  Vitals Entered By: Gweneth Dimitri RN (August 17, 2010 11:26 AM)  O2 Flow:  Room air  O2 Sat Comments Pt arrived to exam room with o2 sat 75% on RA.  o2 sat increased to 95% RA after resting with pulse of 86  Crystal Jones RN  August 17, 2010 11:29 AM  Pt was placed on 2L continuous o2 with sat 97% and pulse 85.  Gweneth Dimitri RN  August 17, 2010 11:35 AM   CC: Acute Visit.  Was seen at UC x 2 days ago for RLL pna.  Was reccomended by PCP to f/u with pulmonary as this is the 3rd time this year she has been diagnosed with pna.  increased SOB, nonprod cough, achy pains in chest, hoarseness, sweats, chills - onset this past weekend.  Cough and SOB worse when laying down.  Requesting new nebulizer Comments Medications reviewed with patient Daytime contact number verified with patient. Gweneth Dimitri RN  August 17, 2010 11:21 AM    Physical Exam  General:  morbidly obese female in nad Nose:  no discharge or purulence noted. Neck:  no jvd, tmg ,LN Lungs:  poor depth of inspiration, a few basilar crackles, no wheezing Heart:  rrr, 2/6 sem Extremities:  1-2+ edema bilat, some tenderness in calf no cyanosis  Neurologic:  alert and oriented, moves all 4.   Impression & Recommendations:  Problem # 1:  CHRONIC RESPIRATORY FAILURE (ICD-518.83) the pt has multifactorial respiratory failure.  She has known obstructive lung disease, morbid obesity with a probable element of OHS, diastolic dysfunction with some degree of pulmonary htn.  She  needs to be more compliant with her oxygen.  It is really unclear whether she had pna or not, and I am wondering if this episode is more of a diastolic chf issue given her worsening LE edema and 8 pound weight gain in a short period of time.  I do not have the xrays in my possession, and she is already taking abx. I have asked her to go ahead and fill these.  I will also increase her diuretic dose for a period of time to help with what appears to be volume overload.  I have also stressed to the pt the need to lose weight.  Problem # 2:  COPD (ICD-496) the pt has no evidence for an acute exacerbation or bronchospasm by exam and history today.  I have asked her to continue with her current meds.  Problem # 3:  PULMONARY SARCOIDOSIS (ICD-135) this is felt to be  a nonissue at this time.  Her ct in august showed no significant disease from sarcoid except nodules, and certainly nothing that would contribute to chronic respiratory failure.  Medications Added to Medication List This Visit: 1)  Omeprazole 40 Mg Cpdr (Omeprazole) .... Take 1 capsule by mouth once a day 2)  Furosemide 40 Mg Tabs (Furosemide) .... Take 1 tablet by mouth once a day 3)  Cefdinir 300 Mg Caps (Cefdinir) .... Take 1 capsule by mouth two times a day 4)  Zithromax Z-pak 250 Mg Tabs (Azithromycin) .... As directed  Other Orders: Est. Patient Level IV (04540) DME Referral (DME) Doppler Referral (Doppler)  Patient Instructions: 1)  take your fluid pill (lasix 40mg ) 2 each am instead of one for the next 3 days only.  Then go back to taking one a day. 2)  will do scan of your legs to look for blood clots.  I will call you with results. 3)  stay on oxygen all the time. 4)  finish up your antibiotics. 5)  followup with me in 2 weeks to check on your progress.  Please go by and pick up your disk at urgent care and bring to next visit.   Immunization History:  Influenza Immunization History:    Influenza:  historical  (05/26/2010)

## 2010-10-27 NOTE — Assessment & Plan Note (Signed)
Summary: requalify for O2   Allergies: 1)  ! Bactrim 2)  ! Doxycycline 3)  ! Zithromax 4)  Cipro 5)  * Latex   Other Orders: No Charge Patient Arrived (NCPA0) (NCPA0)      Ambulatory Pulse Oximetry  Resting; HR__81__    02 Sat__92ra__  Lap1 (185 feet)   HR__84__   02 Sat__81ra__ Lap2 (185 feet)   HR_____   02 Sat_____    Lap3 (185 feet)   HR_____   02 Sat_____  ___Test Completed without Difficulty _X_Test Stopped due to:  decreased oxygen saturations  pt walked 1/2 lap before o2 sats dropped to 81% ra; 79% by the time oxygen was provided.  placed patient on 2L/min continuous oxygen and allowed to rest x43minutes.  o2 sats increased to 95% on 2L/min cont oxygen. Boone Master CNA/MA  June 06, 2010 4:22 PM   per 8.31.11 phone note, Santina Evans with [facility] needed a qualifying sat.  qualifying sat obtained and faxed to [phone] attn Santina Evans. Boone Master CNA/MA  June 06, 2010 4:55 PM

## 2010-10-27 NOTE — Progress Notes (Signed)
Summary: letter of medical necessity  Phone Note Other Incoming Call back at [phone]   Caller: tara//[facility] Summary of Call: Need a letter of medical necessity  for O2. Initial call taken by: Darletta Moll,  July 18, 2010 2:05 PM  Follow-up for Phone Call        lmtcb x 1. Zackery Barefoot CMA  July 18, 2010 2:24 PM   Caller checking status of pt's [facility] paperwork. Has been faxing to [phone], I advised her to send to [phone]. Zackery Barefoot CMA  July 18, 2010 2:48 PM      Appended Document: letter of medical necessity pt needs oxygen only with exertion and HS.  Not at rest while sitting.

## 2010-10-27 NOTE — Miscellaneous (Signed)
Summary: pft results.  Clinical Lists Changes  pfts show moderate airflow obstruction, mild restriction, +airtrapping, and a very severe decrease in dlco Her spirometry is a little bit better than those done last year.  Appended Document: pft results. please let pt know that pfts stable to a little better from a year ago  Appended Document: pft results. lmomtcb x1  Appended Document: pft results. Pt called back Let pt know about  pft results. pt verbalized understanding.

## 2010-10-27 NOTE — Progress Notes (Signed)
  Phone Note Other Incoming   Request: Send information Action Taken: Software engineer of Call: Request for records received from [facility] [place], [place]. 24 pages forwarded to Dr. Shelle Iron for review.      Appended Document:  received records and put in [facility] very important look at folder for him to review.

## 2010-10-27 NOTE — Miscellaneous (Signed)
Summary: Orders Update pft charges  Clinical Lists Changes  Orders: Added new Service order of Carbon Monoxide diffusing w/capacity (94720) - Signed Added new Service order of Lung Volumes (94240) - Signed Added new Service order of Spirometry (Pre & Post) (94060) - Signed 

## 2010-10-27 NOTE — Assessment & Plan Note (Signed)
Summary: ER FU PER PT--COUGH CHEST CONGESTION  STC   Vital Signs:  Patient profile:   45 year old female Height:      68.5 inches (173.99 cm) Weight:      309.12 pounds (140.51 kg) O2 Sat:      90 % on 2 L/min Temp:     98.2 degrees F (36.78 degrees C) oral Pulse rate:   84 / minute BP sitting:   128 / 78  (left arm) Cuff size:   large  Vitals Entered By: Orlan Leavens RMA (October 20, 2010 10:44 AM)  O2 Flow:  2 L/min CC: ER F/U Is Patient Diabetic? No Pain Assessment Patient in pain? no        Primary Care Provider:  Newt Lukes MD  CC:  ER F/U.  History of Present Illness: here for ER f/u- seen 09/30/10 for SOB/wheeze tx zpak and pred pak - improved breathing but considerable swelling  also reviewed chronic med issues: 1) chronic hypoxic resp failure - home O2 dep since 2010 - overlap with OSA/OHS and obst dz per pulm - reports compliance with ongoing medical treatment and home O2 and no changes in medication dose or frequency. denies adverse side effects related to current therapy.  chronic dyspnea and congestion sensation - Quit smoking 08/2010!!  2) HTN - reports compliance with ongoing medical treatment and no changes in medication dose or frequency. denies adverse side effects related to current therapy and ?rash from dilt  resolved with change back to amlodipine  3) dCHF, chronic - echo 12/2009 reviewed - reports compliance with ongoing medical treatment and no changes in medication dose or frequency. denies adverse side effects related to current therapy. increased edema and dyspnea with resumed lasix  4) morbid obesity - weight gain reviewed - ??all fluid  5) anxiety - chronic hx same with daily BZ use - changed from klonopin to xanax 07/2010 due to "too sleepy" -   6) GERD - reports compliance with ongoing medical treatment and no changes in medication dose or frequency. denies adverse side effects related to current therapy.   6) dyslipidemia -started on  simva spring 2011- reports intermittent compliance with ongoing medical treatment and no changes in medication dose or frequency. denies adverse side effects related to current therapy.   7) fibromyalgia - chronic pain - on daily narcotics for years - would like to reduce dose if possible (reports on "10mg " while in [place] - now on "5" - but note prev hydrocod, now oxy) - pain affectes chest, back, legs and "whole body"  Preventive Screening-Counseling & Management  Alcohol-Tobacco     Smoking Status: quit < 6 months     Tobacco Counseling: not to resume use of tobacco products  Clinical Review Panels:  CBC   WBC:  4.2 (09/30/2010)   RBC:  5.85 (09/30/2010)   Hgb:  14.7 (09/30/2010)   Hct:  45.2 (09/30/2010)   Platelets:  147 (09/30/2010)   MCV  77.3 (09/30/2010)   RDW  16.4 (09/30/2010)   PMN:  55 (09/30/2010)   Monos:  8 (09/30/2010)   Eosinophils:  6 (09/30/2010)   Basophil:  1 (09/30/2010)  Complete Metabolic Panel   Glucose:  93 (09/30/2010)   Sodium:  138 (09/30/2010)   Potassium:  3.5 (09/30/2010)   Chloride:  103 (09/30/2010)   CO2:  30 (09/30/2010)   BUN:  10 (09/30/2010)   Creatinine:  1.0 (09/30/2010)   Albumin:  3.4 (03/06/2010)   Total Protein:  7.3 (03/06/2010)   Calcium:  8.8 (09/08/2010)   Total Bili:  0.6 (03/06/2010)   Alk Phos:  135 (03/06/2010)   SGPT (ALT):  28 (03/06/2010)   SGOT (AST):  29 (03/06/2010)   -  Date:  09/30/2010    BG Random: 93    BUN: 10    Creatinine: 1.0    Sodium: 138    Potassium: 3.5    Chloride: 103    CO2 Total: 30    WBC: 4.2    HGB: 14.7    HCT: 45.2    RBC: 5.85    PLT: 147    MCV: 77.3    RDW: 16.4    Neutrophil: 55    Lymphs: 30    Monos: 8    Eos: 6    Basophil: 1  Current Medications (verified): 1)  Albuterol Sulfate (2.5 Mg/51ml) 0.083% Nebu (Albuterol Sulfate) .... Qid As Needed 2)  Spiriva Handihaler 18 Mcg Caps (Tiotropium Bromide Monohydrate) .... Once Daily 3)  Symbicort 160-4.5 Mcg/act  Aero  (Budesonide-Formoterol Fumarate) .... Two Puffs Twice Daily 4)  Ventolin Hfa 108 (90 Base) Mcg/act Aers (Albuterol Sulfate) .... As Needed 5)  Klor-Con M20 20 Meq Cr-Tabs (Potassium Chloride Crys Cr) .Marland Kitchen.. 1 By Mouth Two Times A Day 6)  Oxycodone-Acetaminophen 5-325 Mg Tabs (Oxycodone-Acetaminophen) .... Take 1 Tablet By Mouth Two Times A Day 7)  Omeprazole 40 Mg Cpdr (Omeprazole) .... Take 1 Capsule By Mouth Once A Day 8)  Furosemide 40 Mg Tabs (Furosemide) .... Take 1 Tablet By Mouth Two Times A Day (Or As Directed) 9)  Norvasc 5 Mg Tabs (Amlodipine Besylate) .Marland Kitchen.. 1 By Mouth Once Daily 10)  Zyrtec Allergy 10 Mg Tabs (Cetirizine Hcl) .... Take 1 Tablet By Mouth Once A Day 11)  Xanax 0.5 Mg Tabs (Alprazolam) .Marland Kitchen.. 1 Tablet Two Times A Day 12)  Simvastatin 10 Mg Tabs (Simvastatin) .... Take 1 At Bedtime 13)  Flonase 50 Mcg/act Susp (Fluticasone Propionate) .... 2 Sprays Each Nostril  Every Morning 14)  Oxygen .... W/ Exertion and At Night 15)  Levaquin 750 Mg  Tabs (Levofloxacin) .... One Tablet By Mouth Daily 16)  Prednisone 10 Mg  Tabs (Prednisone) .... Take 4 Each Day For 2 Days, Then 3 Each Day For 2 Days, Then 2 Each Day For 2 Days, Then 1 Each Day For 2 Days, Then Stop  Allergies (verified): 1)  ! Bactrim 2)  ! Doxycycline 3)  ! 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  The patient denies fever, chest pain, syncope, and abdominal pain.    Physical Exam  General:  morbidly obese female in  nad Lungs:  poor depth of inspiration, a few basilar crackles, no wheezing Heart:  distant heart sounds but normal rate, regular rhythm, 2/6 syst murmur, and no rub. BLE with 2+ pitting edema.  Psych:  Oriented X3, memory intact for recent and remote, normally interactive, good eye contact, not anxious appearing, not depressed appearing, and not agitated.  Impression & Recommendations:  Problem # 1:  ACUTE ON CHRONIC DIASTOLIC HEART FAILURE (ICD-428.33)  16# weight gain and increease edema/dyspnea - double up lasix now - erx done - inc Kcl also advised daily weight check at home -  f/u 2 weeks, sooner if problems  Her updated medication list for this problem includes:    Furosemide 80 Mg Tabs (Furosemide) .Marland Kitchen... 1 by mouth two times a day until further notice  Orders: Prescription Created Electronically [phone])  Problem # 2:  CHRONIC OBSTRUCTIVE PULMONARY DISEASE, ACUTE EXACERBATION (ICD-491.21) per pulm today: the pt's description of symptoms is most c/w an acute copd exacerbation due to acute bronchitis.  She will need a course of abx (more broad spectrum than her zpak earlier in the month), as well as a prednisone taper.  At least she has quit smoking 4 weeks ago.  She is to continue on her oxygen and bronchodilators, and if fails to improve, will need hospitalization.  Complete Medication List: 1)  Albuterol Sulfate (2.5 Mg/25ml) 0.083% Nebu (Albuterol sulfate) .... Qid as needed 2)  Spiriva Handihaler 18 Mcg Caps (Tiotropium bromide monohydrate) .... Once daily 3)  Symbicort 160-4.5 Mcg/act Aero (Budesonide-formoterol fumarate) .... Two puffs twice daily 4)  Ventolin Hfa 108 (90 Base) Mcg/act Aers (Albuterol sulfate) .... As needed 5)  Klor-con M20 20 Meq Cr-tabs (Potassium chloride crys cr) .Marland Kitchen.. 1 by mouth three times a day 6)  Oxycodone-acetaminophen 5-325 Mg Tabs (Oxycodone-acetaminophen) .... Take 1 tablet by mouth two times a day - to fill 10/24/10 7)  Omeprazole 40 Mg Cpdr  (Omeprazole) .... Take 1 capsule by mouth once a day 8)  Furosemide 80 Mg Tabs (Furosemide) .Marland Kitchen.. 1 by mouth two times a day until further notice 9)  Norvasc 5 Mg Tabs (Amlodipine besylate) .Marland Kitchen.. 1 by mouth once daily 10)  Zyrtec Allergy 10 Mg Tabs (Cetirizine hcl) .... Take 1 tablet by mouth once a day 11)  Xanax 0.5 Mg Tabs (Alprazolam) .Marland Kitchen.. 1 tablet two times a day 12)  Simvastatin 10 Mg Tabs (Simvastatin) .... Take 1 at bedtime 13)  Flonase 50 Mcg/act Susp (Fluticasone propionate) .... 2 sprays each nostril  every morning 14)  Oxygen  .... W/ exertion and at night 15)  Levaquin 750 Mg Tabs (Levofloxacin) .... One tablet by mouth daily 16)  Prednisone 10 Mg Tabs (Prednisone) .... Take 4 each day for 2 days, then 3 each day for 2 days, then 2 each day for 2 days, then 1 each day for 2 days, then stop  Patient Instructions: 1)  it was good to see you today. 2)  ER records, labs and medications reviewed -  3)  increase furosemide to 80mg  two times a day until further notice and increase potassium to 3 pills per day 4)  your prescriptions have been electronically submitted to your pharmacy. Please take as directed. Contact our office if you believe you're having problems with the medication(s).  5)  printed pain pill prescription given to you today for 10/24/10 6)  Please schedule a follow-up appointment in 2 weeks to recheck weight and breathing, call sooner if problems.  7)  Congrats with being done smoking!! yea!!! 8)  if your symptoms continue to worsen (pain, trouble breathing, fever, etc), or if you are unable take anything by mouth (pills, fluids, etc), you should go to the emergency room for further evaluation and treatment.  Prescriptions: OXYCODONE-ACETAMINOPHEN 5-325 MG TABS (OXYCODONE-ACETAMINOPHEN) Take 1 tablet by mouth two times a day - to fill  10/24/10  #60 x 0   Entered and Authorized by:   Newt Lukes MD   Signed by:   Newt Lukes MD on 10/20/2010   Method used:    Print then Give to Patient   RxID:   [id number] FUROSEMIDE 80 MG TABS (FUROSEMIDE) 1 by mouth two times a day until further notice  #60 x 3   Entered and Authorized by:   Newt Lukes MD   Signed by:   Newt Lukes MD on 10/20/2010   Method used:   Electronically to        [facility] [address] [phone]* (retail)       [address]       [place], [place]  [address]       Ph: [phone] or [phone]       Fax: [phone]   RxID:   [id number]    Orders Added: 1)  Est. Patient Level IV [18841] 2)  Prescription Created Electronically [[id number]]      CXR  Procedure date:  09/30/2010  Findings:      Impression: chronic interstitial lung disease and bibasilar scarring again noted. This is not significantly change in appearance from previous exam

## 2010-10-27 NOTE — Assessment & Plan Note (Signed)
Summary: 1-2 WK FU  STC   Vital Signs:  Patient profile:   45 year old female Height:      68.5 inches (173.99 cm) Weight:      293.0 pounds (133.18 kg) O2 Sat:      94 % on 2 L/min Temp:     97.5 degrees F (36.39 degrees C) oral Pulse rate:   88 / minute BP sitting:   110 / 70  (left arm) Cuff size:   large  Vitals Entered By: Orlan Leavens RMA (September 08, 2010 10:46 AM)  O2 Flow:  2 L/min CC: 1-2 week follow-up Is Patient Diabetic? No Pain Assessment Patient in pain? no        Primary Care Provider:  Newt Lukes MD  CC:  1-2 week follow-up.  History of Present Illness: here for f/u-  1) chronic hypoxic resp failure - home O2 dep since 2010 - overlap with OSA/OHS and obst dz per pulm - reports compliance with ongoing medical treatment and home O2 and no changes in medication dose or frequency. denies adverse side effects related to current therapy.  chronic dyspnea and congestion sensation  2) HTN - reports compliance with ongoing medical treatment and no changes in medication dose or frequency. denies adverse side effects related to current therapy and ?rash from dilt  resolved with change back to amlodipine  3) dCHF, chronic - echo 12/2009 reviewed - reports compliance with ongoing medical treatment and no changes in medication dose or frequency. denies adverse side effects related to current therapy. improved edema and dyspnea with resumed lasix  4) morbid obesity -  5) anxiety - chronic hx same with daily BZ use - changed from klonopin to xanax 07/2010 due to "too sleepy" -   6) GERD - reports compliance with ongoing medical treatment and no changes in medication dose or frequency. denies adverse side effects related to current therapy.   6) dyslipidemia -started on simva spring 2011- reports intermittent compliance with ongoing medical treatment and no changes in medication dose or frequency. denies adverse side effects related to current therapy.   7)  fibromyalgia - chronic pain - on daily narcotics for years - would like to reduce dose if possible (reports on "10mg " while in [place] - now on "5" - but note prev hydrocod, now oxy) - pain affectes chest, back, legs and "whole body"  Clinical Review Panels:  Prevention   Last Mammogram:  No specific mammographic evidence of malignancy.   (09/25/2008)   Last Pap Smear:  Interpretation Result:Negative for intraepithelial Lesion or Malignancy.    (09/25/2008)   Last Colonoscopy:  Pt states was done in [facility]. Had 1 poylp removed (11/23/2009)  Immunizations   Last Flu Vaccine:  Historical (05/26/2010)   Last Pneumovax:  Historical (06/25/2006)  Lipid Management   Cholesterol:  189 (03/06/2010)   LDL (bad choesterol):  92 (03/06/2010)   HDL (good cholesterol):  79 (03/06/2010)   Triglycerides:  89 (03/06/2010)  CBC   WBC:  4.3 (08/18/2010)   RBC:  5.37 (08/18/2010)   Hgb:  13.5 (08/18/2010)   Hct:  41.9 (08/18/2010)   Platelets:  158 (08/18/2010)   MCV  78.0 (08/18/2010)   RDW  17.2 (08/18/2010)   PMN:  52 (08/18/2010)   Monos:  14 (08/18/2010)   Eosinophils:  7 (08/18/2010)   Basophil:  1 (08/18/2010)  Complete Metabolic Panel   Glucose:  113 (03/06/2010)   Sodium:  135 (03/06/2010)   Potassium:  4.0 (  03/06/2010)   Chloride:  104 (03/06/2010)   CO2:  25 (03/06/2010)   BUN:  10 (03/06/2010)   Creatinine:  1.03 (03/06/2010)   Albumin:  3.4 (03/06/2010)   Total Protein:  7.3 (03/06/2010)   Calcium:  8.8 (03/06/2010)   Total Bili:  0.6 (03/06/2010)   Alk Phos:  135 (03/06/2010)   SGPT (ALT):  28 (03/06/2010)   SGOT (AST):  29 (03/06/2010)   Current Medications (verified): 1)  Albuterol Sulfate (2.5 Mg/15ml) 0.083% Nebu (Albuterol Sulfate) .... Qid As Needed 2)  Spiriva Handihaler 18 Mcg Caps (Tiotropium Bromide Monohydrate) .... Once Daily 3)  Symbicort 160-4.5 Mcg/act  Aero (Budesonide-Formoterol Fumarate) .... Two Puffs Twice Daily 4)  Ventolin Hfa 108 (90  Base) Mcg/act Aers (Albuterol Sulfate) .... As Needed 5)  Klor-Con M20 20 Meq Cr-Tabs (Potassium Chloride Crys Cr) .Marland Kitchen.. 1 By Mouth Two Times A Day 6)  Oxycodone-Acetaminophen 5-325 Mg Tabs (Oxycodone-Acetaminophen) .... Take 1 Tablet By Mouth Two Times A Day 7)  Omeprazole 40 Mg Cpdr (Omeprazole) .... Take 1 Capsule By Mouth Once A Day 8)  Furosemide 40 Mg Tabs (Furosemide) .... Take 1 Tablet By Mouth Two Times A Day (Or As Directed) 9)  Norvasc 5 Mg Tabs (Amlodipine Besylate) .Marland Kitchen.. 1 By Mouth Once Daily 10)  Zyrtec Allergy 10 Mg Tabs (Cetirizine Hcl) .... Take 1 Tablet By Mouth Once A Day 11)  Xanax 0.5 Mg Tabs (Alprazolam) .Marland Kitchen.. 1 Tablet Two Times A Day 12)  Simvastatin 10 Mg Tabs (Simvastatin) .... Take 1 At Bedtime 13)  Flonase 50 Mcg/act Susp (Fluticasone Propionate) .... 2 Sprays Each Nostril  Every Morning  Allergies (verified): 1)  ! Bactrim 2)  ! Doxycycline 3)  ! 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    Physical Exam  General:  morbidly obese female in nad Lungs:  poor depth of inspiration, a few basilar crackles, no wheezing Heart:  distant heart sounds but normal rate, regular rhythm, 2/6 syst murmur, and no rub. BLE with trace edema.  Psych:  Oriented X3, memory intact for recent and remote, normally interactive, good eye contact, not anxious appearing, not depressed appearing, and not agitated.      Impression & Recommendations:  Problem # 1:  ACUTE ON CHRONIC DIASTOLIC HEART FAILURE  (ICD-428.33)  Her updated medication list for this problem includes:    Furosemide 40 Mg Tabs (Furosemide) .Marland Kitchen... Take 1 tablet by mouth two times a day (or as directed)  Orders: TLB-BMP (Basic Metabolic Panel-BMET) (80048-METABOL)  improved - weight loss reviewed and less DOE -  reviewed echo 12/2009 report in EMR - normal LVEF with grade 1 diast dysfx; also mod pulm htn cont same lasix - check lytes now consider need for further cardiac eval after review of prior records  Problem # 2:  HYPERTENSION (ICD-401.9)  Her updated medication list for this problem includes:    Furosemide 40 Mg Tabs (Furosemide) .Marland Kitchen... Take 1 tablet by mouth two times a day (or as directed)    Norvasc 5 Mg Tabs (Amlodipine besylate) .Marland Kitchen... 1 by mouth  once daily  Orders: TLB-BMP (Basic Metabolic Panel-BMET) (80048-METABOL)  BP today: 110/70 Prior BP: 128/62 (08/26/2010)  Labs Reviewed: K+: 4.0 (03/06/2010) Creat: : 1.03 (03/06/2010)   Chol: 189 (03/06/2010)   HDL: 79 (03/06/2010)   LDL: 92 (03/06/2010)   TG: 89 (03/06/2010)  Problem # 3:  CHRONIC RESPIRATORY FAILURE (ICD-518.83)  Orders: TLB-BMP (Basic Metabolic Panel-BMET) (80048-METABOL) Tobacco use cessation intermediate 3-10 minutes (99406)  home O2 since 2010 - multifact - obst dz, MO with OHS, OSA recent pulm OV and ER visit 08/18/10 reviewed -  no wheeze - tx chronic dCHF as above - meds extensively reviewed encouraged compliance with CPAP and to quit smoking 100%  Problem # 4:  SMOKER (ICD-305.1) 5 minutes today spent on patient education regarding the unhealthy effects of continued tobacco abuse and encouragment of cessation including medical options available to help patient to quit smoking.  Her updated medication list for this problem includes:    Chantix Starting Month Pak 0.5 Mg X 11 & 1 Mg X 42 Tabs (Varenicline tartrate) .Marland Kitchen... As directed  Orders: TLB-BMP (Basic Metabolic Panel-BMET) (80048-METABOL) Tobacco use cessation intermediate  3-10 minutes (16109) Prescription Created Electronically [phone])  Complete Medication List: 1)  Albuterol Sulfate (2.5 Mg/56ml) 0.083% Nebu (Albuterol sulfate) .... Qid as needed 2)  Spiriva Handihaler 18 Mcg Caps (Tiotropium bromide monohydrate) .... Once daily 3)  Symbicort 160-4.5 Mcg/act Aero (Budesonide-formoterol fumarate) .... Two puffs twice daily 4)  Ventolin Hfa 108 (90 Base) Mcg/act Aers (Albuterol sulfate) .... As needed 5)  Klor-con M20 20 Meq Cr-tabs (Potassium chloride crys cr) .Marland Kitchen.. 1 by mouth two times a day 6)  Oxycodone-acetaminophen 5-325 Mg Tabs (Oxycodone-acetaminophen) .... Take 1 tablet by mouth two times a day 7)  Omeprazole 40 Mg Cpdr (Omeprazole) .... Take 1 capsule by mouth once a day 8)  Furosemide 40 Mg Tabs (Furosemide) .... Take 1 tablet by mouth two times a day (or as directed) 9)  Norvasc 5 Mg Tabs (Amlodipine besylate) .Marland Kitchen.. 1 by mouth once daily 10)  Zyrtec Allergy 10 Mg Tabs (Cetirizine hcl) .... Take 1 tablet by mouth once a day 11)  Xanax 0.5 Mg Tabs (Alprazolam) .Marland Kitchen.. 1 tablet two times a day 12)  Simvastatin 10 Mg Tabs (Simvastatin) .... Take 1 at bedtime 13)  Flonase 50 Mcg/act Susp (Fluticasone propionate) .... 2 sprays each nostril  every morning 14)  Chantix Starting Month Pak 0.5 Mg X 11 & 1 Mg X 42 Tabs (Varenicline tartrate) .... As directed  Patient Instructions: 1)  it was good to see you today. 2)  medications reviewed - start chantix - no other changes for now 3)  continue furosemide to two times a day until further notice 4)  your prescriptions have been electronically submitted to your pharmacy. Please take as directed. Contact our office if you believe you're having problems with the medication(s).  5)  refills of these medications will be provided by this office as needed according to the controlled substances contract 6)  test(s) ordered today - your results will be posted on the phone tree for review in 48-72 hours from the time of test  completion; call [phone] and enter your 9 digit MRN (listed above on this page, just below your name); if any changes need to be made or there are abnormal results, you will be contacted directly. 7)  Please schedule a follow-up appointment in  3 months to recheck weight and breathing, call sooner if problems.  8)  Tobacco is very bad for  your health and your loved ones! You Should stop smoking! good luck with the chantix Prescriptions: CHANTIX STARTING MONTH PAK 0.5 MG X 11 & 1 MG X 42 TABS (VARENICLINE TARTRATE) as directed  #1 x 0   Entered and Authorized by:   Newt Lukes MD   Signed by:   Newt Lukes MD on 09/08/2010   Method used:   Electronically to        [facility] [address] [phone]* (retail)       [address]       [place], [place]  [address]       Ph: [phone] or [phone]       Fax: [phone]   RxID:   [id number]    Orders Added: 1)  TLB-BMP (Basic Metabolic Panel-BMET) [80048-METABOL] 2)  Est. Patient Level IV [56433] 3)  Tobacco use cessation intermediate 3-10 minutes [99406] 4)  Prescription Created Electronically [phone]

## 2010-10-27 NOTE — Assessment & Plan Note (Signed)
Summary: consult for sarcoid, dyspnea   Copy to:  Dr. Anson Fret Primary [facility]/Referring Hattie Aguinaldo:  Dr. Anson Fret  CC:  Pulmonary Consult.  Marland Kitchen  History of Present Illness: The pt is a 45y/o female who I have been asked to see for sarcoidosis and possible obstructive lung disease.  The pt was diagnosed with sarcoidosis in 1997 by her history with FOB.  The records for this are not avaialble.  She has been treated intermittantly since that time with prednisone, and has been followed by the pulmonary clinic at the [facility] [place] in the past.  She tells me that she has the diagnosis of "copd".  The pt has not had a ct chest in 2 yrs, and no full pfts in at least one year.  She did have spirometry in May which show mild airflow obstruction,and concomitant restrictive disease could not be excluded.  She has been on oxygen for 2 years, and wears with exertion only and as needed.  She also wears at HS.  She had an echo in FEB of this year which showed nl LV fxn, but the suggestion of impaired relaxation.  The report does mention an estimated elevation of RVSP, but nothing else is mentioned about pulmonary htn.  The pt did have an admission to a [facility] hospital in Mar of this year with MRSA bronchitis and possible copd exacerbation.  She was also diagnosed here with possible RLL pna, and f/u cxr revealed only bibasilar scarring.  The pt currently "feels full of fluid",and that her breathing is worse than usual baseline.  She will get winded with any moderate exertional activity, and also with bringing groceries in from the car.  She has no significant cough except when sick.  She does have known gerd, takes omeprazole.  Preventive Screening-Counseling & Management  Alcohol-Tobacco     Smoking Status: current  Current Medications (verified): 1)  Albuterol Sulfate (2.5 Mg/30ml) 0.083% Nebu (Albuterol Sulfate) .... Qid As Needed 2)  Ipratropium Bromide 0.02 % Soln (Ipratropium Bromide) .... Two Times A  Day 3)  Spiriva Handihaler 18 Mcg Caps (Tiotropium Bromide Monohydrate) .... Once Daily 4)  Advair Diskus 250-50 Mcg/dose Aepb (Fluticasone-Salmeterol) .... Inhale 1 Puff Two Times A Day 5)  Ventolin Hfa 108 (90 Base) Mcg/act Aers (Albuterol Sulfate) .... As Needed 6)  Ferrous Sulfate 325 (65 Fe) Mg Tabs (Ferrous Sulfate) .... Take 1 Tablet By Mouth Two Times A Day 7)  Amlodipine Besylate 5 Mg Tabs (Amlodipine Besylate) .... Take 1 Tablet By Mouth Once A Day 8)  Clonazepam 1 Mg Tabs (Clonazepam) .... Take 1 Tablet By Mouth Two Times A Day 9)  Klor-Con 10 10 Meq Cr-Tabs (Potassium Chloride) .... Take 1 Tablet By Mouth Two Times A Day 10)  Cyclobenzaprine Hcl 5 Mg Tabs (Cyclobenzaprine Hcl) .... Take 1 Tab By Mouth At Bedtime 11)  Oxycodone-Acetaminophen 5-325 Mg Tabs (Oxycodone-Acetaminophen) .... Take 1 Tablet By Mouth Two Times A Day 12)  Omeprazole 40 Mg Cpdr (Omeprazole) .... Take 1 Tablet By Mouth Two Times A Day 13)  Loratadine 10 Mg Tabs (Loratadine) .... Take 1 Tablet By Mouth Once A Day 14)  Furosemide 40 Mg Tabs (Furosemide) .... Take 1 Tablet By Mouth Two Times A Day 15)  Zocor 10 Mg Tabs (Simvastatin) .... Take 1 Tab By Mouth At Bedtime  Allergies (verified): 1)  ! Bactrim 2)  ! Doxycycline 3)  ! Zithromax 4)  Cipro 5)  * Latex  Past History:  Past Medical History: HEADACHE, CHRONIC (ICD-784.0)  SLEEP APNEA (ICD-780.57) HYPERLIPIDEMIA (ICD-272.4) ANXIETY (ICD-300.00) ANEMIA (ICD-285.9) PULMONARY SARCOIDOSIS (ICD-135) COPD (ICD-496)--?due to sarcoid, vs smoking. HYPERTENSION (ICD-401.9)  Past Surgical History: polyp removed - 2011  Family History: Reviewed history and no changes required. emphysema-father allergies-brother heart disease-mother, father, brother stomach cancer-father, brother  Social History: Reviewed history and no changes required. Patient is a current smoker.  1ppd x 21yrs.  Currently down to <1/2ppd. lives with sister seperated no  children disability Smoking Status:  current  Review of Systems       The patient complains of shortness of breath with activity, shortness of breath at rest, productive cough, non-productive cough, chest pain, acid heartburn, indigestion, weight change, tooth/dental problems, nasal congestion/difficulty breathing through nose, sneezing, anxiety, hand/feet swelling, and joint stiffness or pain.  The patient denies coughing up blood, irregular heartbeats, loss of appetite, abdominal pain, difficulty swallowing, sore throat, headaches, itching, ear ache, depression, rash, change in color of mucus, and fever.    Vital Signs:  Patient profile:   45 year old female Height:      68 inches Weight:      287.50 pounds BMI:     43.87 O2 Sat:      91 % on 2 L/mincont Temp:     97.7 degrees F oral Pulse rate:   86 / minute BP sitting:   122 / 80  (right arm) Cuff size:   large  Vitals Entered By: Gweneth Dimitri RN (April 26, 2010 10:59 AM)  O2 Flow:  2 L/mincont CC: Pulmonary Consult.   Comments Medications reviewed with patient Daytime contact number verified with patient. Gweneth Dimitri RN  April 26, 2010 11:00 AM    Physical Exam  General:  morbidly obese female in nad Eyes:  PERRLA and EOMI.   Nose:  mild deviation to left with narrowing.  mild erythema Mouth:  small posterior pharyngeal space, long palate and uvula Neck:  large, unable to assess for jvd no tmg, LN Lungs:  poor inspiratory effort, small lung volumes, basilar crackles, no wheezing Heart:  rrr, no mrg Abdomen:  soft and nontender, bs+ Extremities:  2+ edema bilat, no cyanosis Neurologic:  alert and oriented, moves all 4.   Impression & Recommendations:  Problem # 1:  DYSPNEA ON EXERTION (ICD-786.09) I suspect this is multifactorial, and due to her morbid obesity and deconditioning, her obstructive lung disease, fluid overload issues that I suspect are due to diastolic dysfunction, and finally ? due to her  sarcoid.  I have asked her to work on weight loss and deconditioning, to stop smoking, and to continue on her bronchodilator regimen.  She will also need to follow closely with Dr. Algie Coffer for her fluid issues.  We will also evaluate her sarcoid to see how much of an issue this may be.    Problem # 2:  COPD (ICD-496) the pt has documented airflow obstruction that may be related to chronic asthmatic bronchitis due to smoking, her sarcoid, but doubt emphysema given her age.  I would like to simplify her medical regimen, and change advair to symbicort given what sounds like an upper airway cough.  Problem # 3:  PULMONARY SARCOIDOSIS (ICD-135) Unclear how much of an impact this is having on her pulmonary symptoms.  Will check ct chest and full pfts  Problem # 4:  COUGH (ICD-786.2) Her cough sounds more upper airway than lower, and I am concerned about LPR and her advair diskus.  I cannot r/o that her airways disease from  ongoing smoking or possibly her sarcoid could be playing a role.  Medications Added to Medication List This Visit: 1)  Albuterol Sulfate (2.5 Mg/58ml) 0.083% Nebu (Albuterol sulfate) .... Qid as needed 2)  Ipratropium Bromide 0.02 % Soln (Ipratropium bromide) .... Two times a day 3)  Spiriva Handihaler 18 Mcg Caps (Tiotropium bromide monohydrate) .... Once daily 4)  Advair Diskus 250-50 Mcg/dose Aepb (Fluticasone-salmeterol) .... Inhale 1 puff two times a day 5)  Ventolin Hfa 108 (90 Base) Mcg/act Aers (Albuterol sulfate) .... As needed 6)  Ferrous Sulfate 325 (65 Fe) Mg Tabs (Ferrous sulfate) .... Take 1 tablet by mouth two times a day 7)  Amlodipine Besylate 5 Mg Tabs (Amlodipine besylate) .... Take 1 tablet by mouth once a day 8)  Clonazepam 1 Mg Tabs (Clonazepam) .... Take 1 tablet by mouth two times a day 9)  Klor-con 10 10 Meq Cr-tabs (Potassium chloride) .... Take 1 tablet by mouth two times a day 10)  Cyclobenzaprine Hcl 5 Mg Tabs (Cyclobenzaprine hcl) .... Take 1 tab by  mouth at bedtime 11)  Oxycodone-acetaminophen 5-325 Mg Tabs (Oxycodone-acetaminophen) .... Take 1 tablet by mouth two times a day 12)  Omeprazole 40 Mg Cpdr (Omeprazole) .... Take 1 tablet by mouth two times a day 13)  Loratadine 10 Mg Tabs (Loratadine) .... Take 1 tablet by mouth once a day 14)  Furosemide 40 Mg Tabs (Furosemide) .... Take 1 tablet by mouth two times a day 15)  Zocor 10 Mg Tabs (Simvastatin) .... Take 1 tab by mouth at bedtime  Other Orders: Consultation Level V [id number]) Radiology Referral (Radiology)  Patient Instructions: 1)  will check ct chest to evaluate your lung disease 2)  stop ipratroprium in your nebulizer...don't need to take this 3)  use albuterol neb treatments just for rescue...if you want to take one in the am to start your day, that is ok. 4)  will change advair to symbicort 160/4.5  2 inhalations each am and pm....rinse mouth well. 5)  continue on spiriva one inhalation each am. 6)  will need to have breathing tests done at some point...will wait until you are closer to your usual baseline.  7)  make sure you contact Dr. Algie Coffer if you continue to hold onto fluid 8)  followup with me in 4 weeks.   Immunization History:  Influenza Immunization History:    Influenza:  historical (06/25/2009)

## 2010-10-27 NOTE — Progress Notes (Signed)
Summary: nos appt  Phone Note Call from Patient   Caller: [email or web address]  Call For: clance Summary of Call: Rsc nos from 10/10 to 11/2. Initial call taken by: Darletta Moll,  July 05, 2010 10:42 AM

## 2010-10-27 NOTE — Assessment & Plan Note (Signed)
Summary: New / Medicare,Medicaid/#/cd   Vital Signs:  Patient profile:   45 year old female Height:      68.5 inches (173.99 cm) Weight:      306.8 pounds (139.45 kg) O2 Sat:      93 % on 2 L/min Temp:     98.7 degrees F (37.06 degrees C) oral Pulse rate:   86 / minute BP sitting:   128 / 62  (left arm) Cuff size:   large  Vitals Entered By: Orlan Leavens RMA (August 26, 2010 9:15 AM)  O2 Flow:  2 L/min CC: New patient Is Patient Diabetic? No Pain Assessment Patient in pain? no        Primary Care Provider:  Newt Lukes MD  CC:  New patient.  History of Present Illness: new pt to me nd our divison, known to pulm - here to est care prev followed with Algie Coffer for PCP needs  1) chronic hypoxic resp failure - home O2 dep since 2010 - overlap with OSA/OHS and obst dz per pulm - reports compliance with ongoing medical treatment and home O2 and no changes in medication dose or frequency. denies adverse side effects related to current therapy.  chronic dyspnea and congestion sensation  2) HTN - reports compliance with ongoing medical treatment and no changes in medication dose or frequency. denies adverse side effects related to current therapy but ?rash since starting dilt in place of amlodipine 2 mo ago  3) dCHF, chronic - echo 12/2009 reviewed - reports compliance with ongoing medical treatment and no changes in medication dose or frequency. denies adverse side effects related to current therapy.  has run out of lasix since increased dose rec by pulm last week - continued edema and dyspnea  4) morbid obesity -  5) anxiety - chronic hx same with daily BZ use - changed from klonopin to xanax 07/2010 due to "too sleepy" -   6) GERD - reports compliance with ongoing medical treatment and no changes in medication dose or frequency. denies adverse side effects related to current therapy.   6) dyslipidemia -started on simva spring 2011- reports intermittent compliance with  ongoing medical treatment and no changes in medication dose or frequency. denies adverse side effects related to current therapy.   7) fibromyalgia - chronic pain - on daily narcotics for years - would like to reduce dose if possible (reports on "10mg " while in [place] - now on "5" - but note prev hydrocod, now oxy) - pain affectes chest, back, legs and "whole body"  Preventive Screening-Counseling & Management  Alcohol-Tobacco     Alcohol drinks/day: <1     Alcohol Counseling: not indicated; use of alcohol is not excessive or problematic     Smoking Status: current     Smoking Cessation Counseling: yes     Packs/Day: 0.25     Tobacco Counseling: to quit use of tobacco products  Caffeine-Diet-Exercise     Does Patient Exercise: no     Exercise Counseling: to improve exercise regimen     Depression Counseling: not indicated; screening negative for depression  Safety-Violence-Falls     Seat Belt Counseling: not indicated; patient wears seat belts     Helmet Use: n/a     Firearms in the Home: no firearms in the home     Smoke Detectors: yes     Violence in the Home: no risk noted     Violence Counseling: not indicated; no violence risk noted  Fall Risk Counseling: not indicated; no significant falls noted  Clinical Review Panels:  Prevention   Last Mammogram:  No specific mammographic evidence of malignancy.   (09/25/2008)   Last Pap Smear:  Interpretation Result:Negative for intraepithelial Lesion or Malignancy.    (09/25/2008)   Last Colonoscopy:  Pt states was done in [facility]. Had 1 poylp removed (11/23/2009)   -  Date:  08/18/2010    WBC: 4.3    HGB: 13.5    HCT: 41.9    RBC: 5.37    PLT: 158    MCV: 78.0    RDW: 17.2    Neutrophil: 52    Lymphs: 27    Monos: 14    Eos: 7    Basophil: 1  Date:  03/06/2010    TSH: 0.637    BG Random: 113    BUN: 10    Creatinine: 1.03    Sodium: 135    Potassium: 4.0    Chloride: 104    CO2 Total: 25    SGOT  (AST): 29    SGPT (ALT): 28    T. Bilirubin: 0.6    Alk Phos: 135    Calcium: 8.8    Total Protein: 7.3    Albumin: 3.4    Cholesterol: 189    LDL: 92    HDL: 79    Triglycerides: 89  Current Medications (verified): 1)  Albuterol Sulfate (2.5 Mg/24ml) 0.083% Nebu (Albuterol Sulfate) .... Qid As Needed 2)  Spiriva Handihaler 18 Mcg Caps (Tiotropium Bromide Monohydrate) .... Once Daily 3)  Symbicort 160-4.5 Mcg/act  Aero (Budesonide-Formoterol Fumarate) .... Two Puffs Twice Daily 4)  Ventolin Hfa 108 (90 Base) Mcg/act Aers (Albuterol Sulfate) .... As Needed 5)  Klor-Con 10 10 Meq Cr-Tabs (Potassium Chloride) .... Take 1 Tablet By Mouth Two Times A Day 6)  Oxycodone-Acetaminophen 5-325 Mg Tabs (Oxycodone-Acetaminophen) .... Take 1 Tablet By Mouth Two Times A Day 7)  Omeprazole 40 Mg Cpdr (Omeprazole) .... Take 1 Capsule By Mouth Once A Day 8)  Furosemide 40 Mg Tabs (Furosemide) .... Take 1 Tablet By Mouth Once A Day 9)  Diltiazem Hcl 60 Mg Tabs (Diltiazem Hcl) .... Take 1 Tablet By Mouth Two Times A Day 10)  Zyrtec Allergy 10 Mg Tabs (Cetirizine Hcl) .... Take 1 Tablet By Mouth Once A Day 11)  Xanax 0.5 Mg Tabs (Alprazolam) .Marland Kitchen.. 1 Tablet Two Times A Day 12)  Simvastatin 10 Mg Tabs (Simvastatin) .... Take 1 At Bedtime  Allergies (verified): 1)  ! Bactrim 2)  ! Doxycycline 3)  ! Zithromax 4)  Cipro 5)  * Latex  Past History:  Past Medical History: HEADACHE, CHRONIC  fibromyalgia - daily narcotics SLEEP APNEA, noncompliant w/ CPAP HYPERLIPIDEMIA ANXIETY - chronic BZ use ANEMIA PULMONARY SARCOIDOSIS--unimpressive ct chest 2011 COPD --moderate airflow obstruction, suspect is due to emphysema HYPERTENSION Asthma Allergic rhinitis Colonic polyps, hx GERD  MD roster: pulm - clance  Past Surgical History: polyp removed - 2011   Family History: emphysema-father allergies-brother heart disease-mother, father, brother stomach cancer-father, brother  dad expired age 84 -  stomach ca mom expired age 22 - dementia  Social History: Patient is a current smoker.  1ppd x 39yrs.   Currently down to <1/2ppd. lives alone single no children disability Packs/Day:  0.25 Does Patient Exercise:  no  Review of Systems       c/o no period in >1 year - not sexually active, no spotting, +c/o episodic suprapubic cramping at times "like  a period w/o bleeding";' see HPI above. I have reviewed all other systems and they were negative.   Physical Exam  General:  morbidly obese female in nad Head:  Normocephalic and atraumatic without obvious abnormalities. No apparent alopecia or balding. Eyes:  vision grossly intact; pupils equal, round and reactive to light.  conjunctiva and lids normal.    Ears:  normal pinnae bilaterally, without erythema, swelling, or tenderness to palpation. TMs clear, without effusion, or cerumen impaction. Hearing grossly normal bilaterally  Nose:  no discharge or purulence noted. Mouth:  teeth and gums in good repair; mucous membranes moist, without lesions or ulcers. oropharynx clear without exudate, no erythema.  Neck:  thick, supple, full ROM, no masses, no thyromegaly; no thyroid nodules or tenderness. no JVD or carotid bruits.   Lungs:  poor depth of inspiration, a few basilar crackles, no wheezing Heart:  distant heart sounds but normal rate, regular rhythm, 2/6 syst murmur, and no rub. BLE with 1+ edema.  Abdomen:  obese, soft, non-tender, normal bowel sounds, no distention; no masses and no appreciable hepatomegaly or splenomegaly.   Genitalia:  defer to gyn Msk:  No deformity or scoliosis noted of thoracic or lumbar spine.   Neurologic:  alert & oriented X3 and cranial nerves II-XII symetrically intact.  strength normal in all extremities, sensation intact to light touch, and gait normal. speech fluent without dysarthria or aphasia; follows commands with good comprehension.  Skin:  raised nodular rash beneath tatoo on left ant chest and  right forearm - no other skin changes and tatoo on ext surface of right forearm is flat/smooth Psych:  Oriented X3, memory intact for recent and remote, normally interactive, good eye contact, not anxious appearing, not depressed appearing, and not agitated.      Impression & Recommendations:  Problem # 1:  HYPERTENSION (ICD-401.9)  ?rash induced by dilt - change back to prior norvasc - send for prior records from former PCP Her updated medication list for this problem includes:    Furosemide 40 Mg Tabs (Furosemide) .Marland Kitchen... Take 1 tablet by mouth two times a day (or as directed)    Norvasc 5 Mg Tabs (Amlodipine besylate) .Marland Kitchen... 1 by mouth once daily  BP today: 128/62 Prior BP: 114/86 (08/17/2010)  Orders: Prescription Created Electronically [phone])  Problem # 2:  ACUTE ON CHRONIC DIASTOLIC HEART FAILURE (ICD-428.33)  continued weight gain with DOE - last pulm note reviewed as well as echo 12/2009 report in EMR - normal LVEF with grade 1 diast dysfx; also mod pulm htn complicated by running out of lasix - refill now at rx'd two times a day dose - also inc lasix recheck weight in 1-2 weeks and reck labs - consider need for further cardiac eval after review of prior records Her updated medication list for this problem includes:    Furosemide 40 Mg Tabs (Furosemide) .Marland Kitchen... Take 1 tablet by mouth two times a day (or as directed)  Orders: Prescription Created Electronically [phone])  Problem # 3:  CHRONIC RESPIRATORY FAILURE (ICD-518.83) home O2 since 2010 - multifact - obst dz, MO with OHS, OSA recent pulm OV and ER visit 08/18/10 reviewed -  no wheeze - tx dCHF as above - meds extensively reviewed encouraged compliance with CPAP and to quit smoking 100%  Problem # 4:  HYPERLIPIDEMIA (ICD-272.4)  Her updated medication list for this problem includes:    Simvastatin 10 Mg Tabs (Simvastatin) .Marland Kitchen... Take 1 at bedtime  Problem # 5:  ANXIETY (ICD-300.00) chronic  BZ use - but "sleepy" SE  - advised to use less as able Her updated medication list for this problem includes:    Xanax 0.5 Mg Tabs (Alprazolam) .Marland Kitchen... 1 tablet two times a day  Problem # 6:  MORBID OBESITY (ICD-278.01) advised need for weight loss due to health complications (resp failure, CHF, HTN) Ht: 68.5 (08/26/2010)   Wt: 306.8 (08/26/2010)   BMI: 45.79 (08/17/2010)  Problem # 7:  FIBROMYALGIA (ICD-729.1) chronic narc use reviewed as well as [place] controlled subst registry - contact signed today - pt agrees to efforts to reduce use overtime as able Her updated medication list for this problem includes:    Oxycodone-acetaminophen 5-325 Mg Tabs (Oxycodone-acetaminophen) .Marland Kitchen... Take 1 tablet by mouth two times a day  Problem # 8:  AMENORRHEA (ICD-626.0) not sexually active - not preg no period >26mo - may be menopause but needs gyn eval in setting of morbid obesity Orders: Gynecologic Referral (Gyn)   Time spent with patient , more than 50% of this time was spent counseling patient on chronic health issues, acute dyspnea symptoms and edema, med review and changes and need for close f/u - here, pulm and gyn - also plans to obtain old records and review of available prior labs/tests in hosp and OP EMR files  Complete Medication List: 1)  Albuterol Sulfate (2.5 Mg/95ml) 0.083% Nebu (Albuterol sulfate) .... Qid as needed 2)  Spiriva Handihaler 18 Mcg Caps (Tiotropium bromide monohydrate) .... Once daily 3)  Symbicort 160-4.5 Mcg/act Aero (Budesonide-formoterol fumarate) .... Two puffs twice daily 4)  Ventolin Hfa 108 (90 Base) Mcg/act Aers (Albuterol sulfate) .... As needed 5)  Klor-con M20 20 Meq Cr-tabs (Potassium chloride crys cr) .Marland Kitchen.. 1 by mouth two times a day 6)  Oxycodone-acetaminophen 5-325 Mg Tabs (Oxycodone-acetaminophen) .... Take 1 tablet by mouth two times a day 7)  Omeprazole 40 Mg Cpdr (Omeprazole) .... Take 1 capsule by mouth once a day 8)  Furosemide 40 Mg Tabs (Furosemide) .... Take 1  tablet by mouth two times a day (or as directed) 9)  Norvasc 5 Mg Tabs (Amlodipine besylate) .Marland Kitchen.. 1 by mouth once daily 10)  Zyrtec Allergy 10 Mg Tabs (Cetirizine hcl) .... Take 1 tablet by mouth once a day 11)  Xanax 0.5 Mg Tabs (Alprazolam) .Marland Kitchen.. 1 tablet two times a day 12)  Simvastatin 10 Mg Tabs (Simvastatin) .... Take 1 at bedtime 13)  Flonase 50 Mcg/act Susp (Fluticasone propionate) .... 2 sprays each nostril  every morning  Patient Instructions: 1)  it was good to see you today. 2)  medications reviewed and refilled as discussed 3)  change Diltazem back to norvasc - 4)  increase furosemide to two times a day until further notice 5)  your prescriptions have been electronically submitted to your pharmacy. Please take as directed. Contact our office if you believe you're having problems with the medication(s).  6)  send for records from dr. Algie Coffer 7)  reduce xanax and pain pill to 1/2 pill two times a day if possible as less of these medications will help your breathing 8)  refills of these medications will be provided by this office as needed according to the controlled substances contract 9)  we'll make referral to gynecologist. Our office will contact you regarding this appointment once made.  10)  Please schedule a follow-up appointment in 1-2 weeks to recehck weight and breathing, call sooner if problems.  11)  Tobacco is very bad for your health and your loved ones! You  Should stop smoking!. Prescriptions: KLOR-CON M20 20 MEQ CR-TABS (POTASSIUM CHLORIDE CRYS CR) 1 by mouth two times a day  #60 x 6   Entered and Authorized by:   Newt Lukes MD   Signed by:   Newt Lukes MD on 08/26/2010   Method used:   Electronically to        [facility] [address] [phone]* (retail)       [address]       [place], [place]  [address]       Ph: [phone] or [phone]       Fax: [phone]   RxID:   [id number] NORVASC 5 MG TABS (AMLODIPINE BESYLATE)  1 by mouth once daily  #30 x 6   Entered and Authorized by:   Newt Lukes MD   Signed by:   Newt Lukes MD on 08/26/2010   Method used:   Electronically to        [facility] [address] [phone]* (retail)       [address]       [place], [place]  [address]       Ph: [phone] or [phone]       Fax: [phone]   RxID:   [id number] FUROSEMIDE 40 MG TABS (FUROSEMIDE) Take 1 tablet by mouth two times a day (or as directed)  #60 x 6   Entered and Authorized by:   Newt Lukes MD   Signed by:   Newt Lukes MD on 08/26/2010   Method used:   Electronically to        [facility] [address] [phone]* (retail)       [address]       [place], [place]  [address]       Ph: [phone] or [phone]       Fax: [phone]   RxID:   [id number]    Orders Added: 1)  New Patient Level V [99205] 2)  Prescription Created Electronically [id number] 3)  Gynecologic Referral [Gyn]     Mammogram  Procedure date:  09/25/2008  Findings:      No specific mammographic evidence of malignancy.    Pap Smear  Procedure date:  09/25/2008  Findings:      Interpretation Result:Negative for intraepithelial Lesion or Malignancy.     Colonoscopy  Procedure date:  11/23/2009  Findings:      Pt states was done in [place]. Had 1 poylp removed

## 2010-10-27 NOTE — Progress Notes (Signed)
Summary: medicare qualifications  Phone Note Call from Patient Call back at [phone]   Caller: Pediatric & adult specialist - Santina Evans Call For: Clance Reason for Call: Talk to Nurse Summary of Call: 02 reading on room air for pt's oxygen for Medicare qualifications. Initial call taken by: Eugene Gavia,  May 25, 2010 3:23 PM  Follow-up for Phone Call        called spoke with Santina Evans who requested verification on pt's o2 sat with ambulation.  i informed catherine that a walking sat was not done, that the 90% she was told was pt's o2 with her routine VS during ov.  catherine okay with this.  she does request that the ov note be faxed to her attn @ [phone] to qualify pt for medicare.  requesting this within the week.  will forward to [facility]. Boone Master CNA/MA  May 25, 2010 4:26 PM   Additional Follow-up for Phone Call Additional follow up Details #1::        we are not supposed to fax notes to DME companies due to [employer].  I did not start this patient on oxygen, and they need to get qualifying sats from the office that started her oxygen. Additional Follow-up by: Barbaraann Share MD,  May 25, 2010 5:06 PM    Additional Follow-up for Phone Call Additional follow up Details #2::    LMTCB with co worker to call the office in the morning as she has left for the day; knows to ask for someone in triage.Reynaldo Minium CMA  May 25, 2010 5:09 PM   Spoke with Santina Evans and advised of recs per [facility].  She states that she has already spoken with Dr Algie Coffer regarding this issue b/c he is the one who started o2, but he wanted our office to provide most recent o2 sat.  She states that there is no way to bill medicare without o2 sat.  She states that she already spoke with Victorino Dike this am regarding this and was told that Victorino Dike would be speaking with our supervisor to see what can be done. Vernie Murders  May 26, 2010 4:07 PM  Per TD pt needs to be set to come in and requalify  for oxygen. Pt has appt on monday for pft  do walk at that time as well. Carron Curie CMA  June 01, 2010 3:26 PM

## 2010-10-27 NOTE — Progress Notes (Signed)
Summary: Oxycodone  Phone Note Call from Patient Call back at Home Phone [phone]   Caller: Patient Summary of Call: Pt called requesting refill of Oxycodone Initial call taken by: Margaret Pyle, CMA,  September 20, 2010 1:55 PM  Follow-up for Phone Call        ok - refill done - signed and given to triage b Follow-up by: Newt Lukes MD,  September 20, 2010 2:30 PM  Additional Follow-up for Phone Call Additional follow up Details #1::        Pt advised, Rx in cabinet for pt pick up Additional Follow-up by: Margaret Pyle, CMA,  September 20, 2010 2:38 PM    Prescriptions: OXYCODONE-ACETAMINOPHEN 5-325 MG TABS (OXYCODONE-ACETAMINOPHEN) Take 1 tablet by mouth two times a day  #60 x 0   Entered and Authorized by:   Newt Lukes MD   Signed by:   Newt Lukes MD on 09/20/2010   Method used:   Print then Give to Patient   RxID:   [id number] SPIRIVA HANDIHALER 18 MCG CAPS (TIOTROPIUM BROMIDE MONOHYDRATE) once daily  #3 x 1   Entered by:   Margaret Pyle, CMA   Authorized by:   Newt Lukes MD   Signed by:   Margaret Pyle, CMA on 09/20/2010   Method used:   Electronically to        [facility] [address] [phone]* (retail)       [address]       [place], [place]  [address]       Ph: [phone] or [phone]       Fax: [phone]   RxID:   [id number]

## 2010-10-27 NOTE — Assessment & Plan Note (Signed)
Summary: rov for copd, sarcoidosis   Visit Type:  Follow-up Copy to:  Dr. Anson Fret Primary Provider/Referring Provider:  Dr. Anson Fret  CC:  follow up. pt c/o dry cough, breathing is still the same, occas wheezing, chest pain, and sweats. pt states she smokes 1 pack per week. Marland Kitchen  History of Present Illness: The pt comes in today for f/u of her known copd and sarcoidosis.  She feels that her exertional tolerance is at her usual baseline, and her cough is minimal at this point.  She is staying on her bronchodilator regimen and oxygen, but unfortunately continues to smoke.  She is not producing purulent mucus, nor does she have chest congestion.    Preventive Screening-Counseling & Management  Alcohol-Tobacco     Smoking Status: current     Smoking Cessation Counseling: yes     Packs/Day: <0.25     Tobacco Counseling: to quit use of tobacco products  Current Medications (verified): 1)  Albuterol Sulfate (2.5 Mg/55ml) 0.083% Nebu (Albuterol Sulfate) .... Qid As Needed 2)  Spiriva Handihaler 18 Mcg Caps (Tiotropium Bromide Monohydrate) .... Once Daily 3)  Symbicort 160-4.5 Mcg/act  Aero (Budesonide-Formoterol Fumarate) .... Two Puffs Twice Daily 4)  Ventolin Hfa 108 (90 Base) Mcg/act Aers (Albuterol Sulfate) .... As Needed 5)  Klor-Con 10 10 Meq Cr-Tabs (Potassium Chloride) .... Take 1 Tablet By Mouth Two Times A Day 6)  Oxycodone-Acetaminophen 5-325 Mg Tabs (Oxycodone-Acetaminophen) .... Take 1 Tablet By Mouth Two Times A Day 7)  Omeprazole 40 Mg Cpdr (Omeprazole) .... Take 1 Tablet By Mouth Two Times A Day 8)  Furosemide 40 Mg Tabs (Furosemide) .... Take 1 Tablet By Mouth Two Times A Day 9)  Diltiazem Hcl 60 Mg Tabs (Diltiazem Hcl) .... Take 1 Tablet By Mouth Two Times A Day 10)  Zyrtec Allergy 10 Mg Tabs (Cetirizine Hcl) .... Take 1 Tablet By Mouth Once A Day 11)  Xanax 0.5 Mg Tabs (Alprazolam) .Marland Kitchen.. 1 Tablet Two Times A Day  Allergies (verified): 1)  ! Bactrim 2)  ! Doxycycline 3)  !  Zithromax 4)  Cipro 5)  * Latex  Past History:  Past medical, surgical, family and social histories (including risk factors) reviewed, and no changes noted (except as noted below).  Past Medical History: HEADACHE, CHRONIC (ICD-784.0) SLEEP APNEA (ICD-780.57) HYPERLIPIDEMIA (ICD-272.4) ANXIETY (ICD-300.00) ANEMIA (ICD-285.9) PULMONARY SARCOIDOSIS (ICD-135) COPD (ICD-496)--moderate airflow obstruction, suspect is due to emphysema HYPERTENSION (ICD-401.9)  Past Surgical History: Reviewed history from 04/26/2010 and no changes required. polyp removed - 2011  Family History: Reviewed history from 04/26/2010 and no changes required. emphysema-father allergies-brother heart disease-mother, father, brother stomach cancer-father, brother  Social History: Reviewed history from 04/26/2010 and no changes required. Patient is a current smoker.  1ppd x 47yrs.  Currently down to <1/2ppd. lives with sister seperated no children disability Packs/Day:  <0.25  Review of Systems       The patient complains of shortness of breath with activity, non-productive cough, chest pain, sore throat, and joint stiffness or pain.  The patient denies shortness of breath at rest, productive cough, coughing up blood, irregular heartbeats, acid heartburn, indigestion, loss of appetite, weight change, abdominal pain, difficulty swallowing, tooth/dental problems, headaches, nasal congestion/difficulty breathing through nose, sneezing, itching, ear ache, anxiety, depression, hand/feet swelling, rash, change in color of mucus, and fever.    Vital Signs:  Patient profile:   45 year old female Height:      68 inches Weight:      296.13 pounds BMI:  45.19 O2 Sat:      96 % on 2 L/min continous Temp:     98.0 degrees F oral Pulse rate:   85 / minute BP sitting:   120 / 78  (left arm) Cuff size:   large  Vitals Entered By: Carver Fila (July 27, 2010 1:26 PM)  O2 Flow:  2 L/min continous CC: follow  up. pt c/o dry cough, breathing is still the same, occas wheezing, chest pain, sweats. pt states she smokes 1 pack per week.  Comments meds and allergies updated Phone number updated Carver Fila  July 27, 2010 1:27 PM    Physical Exam  General:  obese female in nad Nose:  no drainage or purulence noted. Lungs:  clear to auscultation, no wheezing or rhonchi Heart:  rrr Extremities:  no signficant edema or cyanosis  Neurologic:  alert and oriented, moves all 4.   Impression & Recommendations:  Problem # 1:  COPD (ICD-496) The pt has known copd by pfts, but it is unclear how much of this is potentially reversible with smoking cessation.  She is on an adequate bronchodilator regimen, and has been wearing her oxygen compliantly.  I have asked her totally quit smoking, and also to work aggressively on weight loss.  I think her obesity is contributing as well to her chronic respiratory failure.  Problem # 2:  PULMONARY SARCOIDOSIS (ICD-135) this has been completely stable.  Recent f/u chest ct showed no LN, no ISLD, and the only manifestation of possible sarcoid was scattered pulmonary nodules.  Medications Added to Medication List This Visit: 1)  Xanax 0.5 Mg Tabs (Alprazolam) .Marland Kitchen.. 1 tablet two times a day  Other Orders: Est. Patient Level III (16109)  Patient Instructions: 1)  you need to stop smoking 100%...this is the key to you getting better, along with weight reduction. 2)  no change in breathing meds. 3)  can try chlorpheniramine 8mg  over the counter at bedtime to help with your postnasal drip 4)  follow up with me in 6mos.   Immunization History:  Influenza Immunization History:    Influenza:  historical (06/25/2009)    Influenza:  historical (06/25/2009)  Pneumovax Immunization History:    Pneumovax:  historical (06/25/2006)

## 2010-11-02 NOTE — Assessment & Plan Note (Addendum)
Summary: acute sick visit for copd exacerbation   Copy to:  Dr. Anson Fret Primary Provider/Referring Provider:  Newt Lukes MD  CC:  follow up. Pt states her breahting has been terrible x couple months, cough w/ green phlem, wheezing, and chest tightness and pains. Pt states she is retaining fluid in her legs and feet.  Pt states she quit smoking x 1 month. .  History of Present Illness: the pt comes in today for an acute sick visit.  She has known chronic respiratory failure secondary to moderate emphysema by pfts, probably an element of OHS, and significant diastolic heart failure.  She also has a h/o sarcoidosis with minimal changes on cxr (no ISLD, no LN, a few nodules on ct last year).  She comes in with  persistent chest congestion, cough with purulence, worsening LE edema, and increased sob.  She was at the ER 2 weeks ago with similar symptoms,and treated with a zpak and 5 day prednisone taper.  Her cxr at that time was unchanged from 07/2010, with scarring and bullous disease.  The pt has also gained 8 pounds since the last visit here with me, probably the majority being fluid.    Current Medications (verified): 1)  Albuterol Sulfate (2.5 Mg/51ml) 0.083% Nebu (Albuterol Sulfate) .... Qid As Needed 2)  Spiriva Handihaler 18 Mcg Caps (Tiotropium Bromide Monohydrate) .... Once Daily 3)  Symbicort 160-4.5 Mcg/act  Aero (Budesonide-Formoterol Fumarate) .... Two Puffs Twice Daily 4)  Ventolin Hfa 108 (90 Base) Mcg/act Aers (Albuterol Sulfate) .... As Needed 5)  Klor-Con M20 20 Meq Cr-Tabs (Potassium Chloride Crys Cr) .Marland Kitchen.. 1 By Mouth Two Times A Day 6)  Oxycodone-Acetaminophen 5-325 Mg Tabs (Oxycodone-Acetaminophen) .... Take 1 Tablet By Mouth Two Times A Day 7)  Omeprazole 40 Mg Cpdr (Omeprazole) .... Take 1 Capsule By Mouth Once A Day 8)  Furosemide 40 Mg Tabs (Furosemide) .... Take 1 Tablet By Mouth Two Times A Day (Or As Directed) 9)  Norvasc 5 Mg Tabs (Amlodipine Besylate) .Marland Kitchen.. 1 By  Mouth Once Daily 10)  Zyrtec Allergy 10 Mg Tabs (Cetirizine Hcl) .... Take 1 Tablet By Mouth Once A Day 11)  Xanax 0.5 Mg Tabs (Alprazolam) .Marland Kitchen.. 1 Tablet Two Times A Day 12)  Simvastatin 10 Mg Tabs (Simvastatin) .... Take 1 At Bedtime 13)  Flonase 50 Mcg/act Susp (Fluticasone Propionate) .... 2 Sprays Each Nostril  Every Morning 14)  Oxygen .... W/ Exertion and At Night  Allergies (verified): 1)  ! Bactrim 2)  ! Doxycycline 3)  ! Zithromax 4)  Cipro 5)  * Latex  Past History:  Past medical, surgical, family and social histories (including risk factors) reviewed, and no changes noted (except as noted below).  Past Medical History: HEADACHE, CHRONIC  fibromyalgia - daily narcotics SLEEP APNEA, noncompliant w/ CPAP  HYPERLIPIDEMIA ANXIETY - chronic BZ use  ANEMIA PULMONARY SARCOIDOSIS---no LN, no infiltrates on ct 2011 COPD --moderate airflow obstruction, suspect is due to emphysema/?asthma, nl AAT 2009 HYPERTENSION Allergic rhinitis Colonic polyps, hx GERD  MD roster: pulm - Christen Bedoya  Past Surgical History: Reviewed history from 08/26/2010 and no changes required. polyp removed - 2011   Family History: Reviewed history from 08/26/2010 and no changes required. emphysema-father allergies-brother heart disease-mother, father, brother stomach cancer-father, brother  dad expired age 71 - stomach ca mom expired age 57 - dementia  Social History: Reviewed history from 08/26/2010 and no changes required. Patient is a current smoker.  1ppd x 25yrs.   Currently down to <1/2ppd.  lives alone single no children disability  Review of Systems       The patient complains of shortness of breath with activity, shortness of breath at rest, productive cough, chest pain, irregular heartbeats, acid heartburn, indigestion, weight change, abdominal pain, nasal congestion/difficulty breathing through nose, anxiety, depression, hand/feet swelling, and joint stiffness or pain.  The  patient denies non-productive cough, coughing up blood, loss of appetite, difficulty swallowing, sore throat, tooth/dental problems, headaches, sneezing, itching, ear ache, rash, change in color of mucus, and fever.    Vital Signs:  Patient profile:   45 year old female Height:      68.5 inches Weight:      312.25 pounds BMI:     46.96 O2 Sat:      94 % on 2.5 L/min continous Temp:     98.1 degrees F oral Pulse rate:   88 / minute BP sitting:   116 / 80  (left arm) Cuff size:   large  Vitals Entered By: Carver Fila (October 20, 2010 9:12 AM)  O2 Flow:  2.5 L/min continous CC: follow up. Pt states her breahting has been terrible x couple months, cough w/ green phlem, wheezing, chest tightness and pains. Pt states she is retaining fluid in her legs and feet.  Pt states she quit smoking x 1 month.  Comments meds and allergies updated Phone number updated Mindy Edward Jolly  October 20, 2010 9:12 AM    Physical Exam  General:  obese female in nad Nose:  no purulence or discharge seen Lungs:  decreased bs, no wheezing, mild basilar crackles. Heart:  rrr, 2/6 sem Extremities:  3+ edema, no cyanosis  Neurologic:  alert and oriented, moves all 4.   Impression & Recommendations:  Problem # 1:  CHRONIC OBSTRUCTIVE PULMONARY DISEASE, ACUTE EXACERBATION (ICD-491.21)  the pt's description of symptoms is most c/w an acute copd exacerbation due to acute bronchitis.  She will need a course of abx (more broad spectrum than her zpak earlier in the month), as well as a prednisone taper.  At least she has quit smoking 4 weeks ago.  She is to continue on her oxygen and bronchodilators, and if fails to improve, will need hospitalization.  I have re-iterated to her again that we cannot help her if she continues to smoke.  Problem # 2:  ACUTE ON CHRONIC DIASTOLIC HEART FAILURE (ICD-428.33)  the pt has gained considerable weight with increasing LE edema.  I suspect she has decompensated diastolic heart  failure contributing to her symptoms.  She is to see her primary md next, and will leave diuretic adjustments to her.  Her K was 3.5 at ER 2 weeks ago, and may need more potassium.  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  There is nothing to suggest a sarcoid flare at this time.  Medications Added to Medication List This Visit: 1)  Oxygen  .... W/ exertion and at night 2)  Levaquin 750 Mg Tabs (Levofloxacin) .... One tablet by mouth daily 3)  Prednisone 10 Mg Tabs (Prednisone) .... Take 4 each day for 2 days, then 3 each day for 2 days, then 2 each day for 2 days, then 1 each day for 2 days, then stop  Other Orders: Est. Patient Level IV (04540)  Patient Instructions: 1)  will treat with levaquin 750 mg one  each day for 7days 2)  prednisone taper per prescription 3)  you are doing great staying away from cigarettes, and continue with this.   4)  you have an apptm with Dr Felicity Coyer this am, and she will make adjustments to your fluid medication. 5)  followup with me in two weeks, but call if not getting better.  Prescriptions: PREDNISONE 10 MG  TABS (PREDNISONE) take 4 each day for 2 days, then 3 each day for 2 days, then 2 each day for 2 days, then 1 each day for 2 days, then stop  #20 x 0   Entered and Authorized by:   Barbaraann Share MD   Signed by:   Barbaraann Share MD on 10/20/2010   Method used:   Print then Give to Patient   RxID:   [id number] LEVAQUIN 750 MG  TABS (LEVOFLOXACIN) One tablet by mouth daily  #7 x 0   Entered and Authorized by:   Barbaraann Share MD   Signed by:   Barbaraann Share MD on 10/20/2010   Method used:   Print then Give to Patient   RxID:   [id number]   Appended Document: acute sick visit for copd exacerbation address [facility] and pulm htn next office visit.

## 2010-11-02 NOTE — Progress Notes (Signed)
Summary: flonase  Phone Note Refill Request Message from:  Fax from Pharmacy on October 27, 2010 11:09 AM  Refills Requested: Medication #1:  FLONASE 50 MCG/ACT SUSP 2 sprays each nostril  every morning   Last Refilled: 09/07/2010 [facility]   Method Requested: Electronic Initial call taken by: Orlan Leavens RMA,  October 27, 2010 11:10 AM    Prescriptions: Aleda Grana 50 MCG/ACT SUSP (FLUTICASONE PROPIONATE) 2 sprays each nostril  every morning  #16gm x 1   Entered by:   Orlan Leavens RMA   Authorized by:   Newt Lukes MD   Signed by:   Orlan Leavens RMA on 10/27/2010   Method used:   Electronically to        [facility] [address] [phone]* (retail)       [address]       [place], [place]  [address]       Ph: [phone] or [phone]       Fax: [phone]   RxID:   [id number]

## 2010-11-03 ENCOUNTER — Ambulatory Visit: Payer: Self-pay | Admitting: Internal Medicine

## 2010-11-03 ENCOUNTER — Ambulatory Visit: Payer: Self-pay | Admitting: Pulmonary Disease

## 2010-11-04 ENCOUNTER — Encounter: Payer: Self-pay | Admitting: Pulmonary Disease

## 2010-11-09 ENCOUNTER — Encounter: Payer: Self-pay | Admitting: Pulmonary Disease

## 2010-11-10 ENCOUNTER — Telehealth: Payer: Self-pay | Admitting: Internal Medicine

## 2010-11-11 ENCOUNTER — Ambulatory Visit (INDEPENDENT_AMBULATORY_CARE_PROVIDER_SITE_OTHER): Payer: Medicare Other | Admitting: Internal Medicine

## 2010-11-11 ENCOUNTER — Ambulatory Visit (INDEPENDENT_AMBULATORY_CARE_PROVIDER_SITE_OTHER)
Admission: RE | Admit: 2010-11-11 | Discharge: 2010-11-11 | Disposition: A | Discharge status: Home / Self Care | Payer: Medicare Other | Source: Ambulatory Visit | Attending: Internal Medicine | Admitting: Internal Medicine

## 2010-11-11 ENCOUNTER — Other Ambulatory Visit: Payer: Medicare Other

## 2010-11-11 ENCOUNTER — Encounter: Payer: Self-pay | Admitting: Internal Medicine

## 2010-11-11 ENCOUNTER — Other Ambulatory Visit: Payer: Self-pay | Admitting: Internal Medicine

## 2010-11-11 DIAGNOSIS — R1310 Dysphagia, unspecified: Secondary | ICD-10-CM

## 2010-11-11 DIAGNOSIS — J449 Chronic obstructive pulmonary disease, unspecified: Secondary | ICD-10-CM

## 2010-11-11 DIAGNOSIS — J9610 Chronic respiratory failure, unspecified whether with hypoxia or hypercapnia: Secondary | ICD-10-CM

## 2010-11-11 DIAGNOSIS — I10 Essential (primary) hypertension: Secondary | ICD-10-CM

## 2010-11-11 LAB — BASIC METABOLIC PANEL
BUN: 13 mg/dL (ref 6–23)
CO2: 29 mEq/L (ref 19–32)
Chloride: 100 mEq/L (ref 96–112)
Creatinine, Ser: 1 mg/dL (ref 0.4–1.2)

## 2010-11-16 ENCOUNTER — Other Ambulatory Visit (HOSPITAL_COMMUNITY): Payer: Self-pay | Admitting: Internal Medicine

## 2010-11-16 ENCOUNTER — Encounter: Payer: Self-pay | Admitting: Internal Medicine

## 2010-11-16 NOTE — Progress Notes (Signed)
Summary: SOB?  Phone Note Call from Patient Call back at [facility] Phone [phone]   Caller: Patient Summary of Call: Pt called stating she feels like she aspirated while eating yesterday and is now having SOB , no wheezing. Pt has appt scheduled tomorrw but was advised to go to ER if SOB becomes worse or if she has and wheezing, dizziness. Pt agreed. Initial call taken by: Margaret Pyle, CMA,  November 10, 2010 3:10 PM

## 2010-11-16 NOTE — Assessment & Plan Note (Signed)
Summary: f/u appt   Vital Signs:  Patient profile:   45 year old female Height:      68.5 inches (173.99 cm) Weight:      306.12 pounds (139.15 kg) O2 Sat:      94 % on 2 L/min Temp:     97.7 degrees F (36.50 degrees C) oral Pulse rate:   86 / minute BP sitting:   118 / 76  (left arm) Cuff size:   large  Vitals Entered By: Orlan Leavens RMA (November 11, 2010 10:12 AM)  O2 Flow:  2 L/min CC: follow-up visit Is Patient Diabetic? No Pain Assessment Patient in pain? no        Primary Care Provider:  Newt Lukes MD  CC:  follow-up visit.  History of Present Illness: here for ?aspiration event at home yest inc SOB and cough since that time - no fever, continued edema ongoing swallow problems x 2 years with occ regurgitation  also reviewed chronic med issues: 1) chronic hypoxic resp failure - home O2 dep since 2010 - overlap with OSA/OHS and obst dz per pulm - reports compliance with ongoing medical treatment and home O2 and no changes in medication dose or frequency. denies adverse side effects related to current therapy.  chronic dyspnea and congestion sensation - Quit smoking 08/2010!!  2) HTN - reports compliance with ongoing medical treatment and no changes in medication dose or frequency. denies adverse side effects related to current therapy and ?rash from dilt  resolved with change back to amlodipine  3) dCHF, chronic - echo 12/2009 reviewed - reports compliance with ongoing medical treatment and no changes in medication dose or frequency. denies adverse side effects related to current therapy. improved edema and dyspnea with inc lasix 09/2010  4) morbid obesity - weight gain reviewed - ??all fluid  5) anxiety - chronic hx same with daily BZ use - changed from klonopin to xanax 07/2010 due to "too sleepy"   6) GERD - reports compliance with ongoing medical treatment and no changes in medication dose or frequency. denies adverse side effects related to current  therapy.   6) dyslipidemia -started on simva spring 2011- reports intermittent compliance with ongoing medical treatment and no changes in medication dose or frequency. denies adverse side effects related to current therapy.   7) fibromyalgia - chronic pain - on daily narcotics for years - would like to reduce dose if possible (reports on "10mg " while in [place] - now on "5" - but note prev hydrocod, now oxy) - pain affectes chest, back, legs and "whole body"  Clinical Review Panels:  Lipid Management   Cholesterol:  189 (03/06/2010)   LDL (bad choesterol):  92 (03/06/2010)   HDL (good cholesterol):  79 (03/06/2010)   Triglycerides:  89 (03/06/2010)  CBC   WBC:  4.2 (09/30/2010)   RBC:  5.85 (09/30/2010)   Hgb:  14.7 (09/30/2010)   Hct:  45.2 (09/30/2010)   Platelets:  147 (09/30/2010)   MCV  77.3 (09/30/2010)   RDW  16.4 (09/30/2010)   PMN:  55 (09/30/2010)   Monos:  8 (09/30/2010)   Eosinophils:  6 (09/30/2010)   Basophil:  1 (09/30/2010)  Complete Metabolic Panel   Glucose:  93 (09/30/2010)   Sodium:  138 (09/30/2010)   Potassium:  3.5 (09/30/2010)   Chloride:  103 (09/30/2010)   CO2:  30 (09/30/2010)   BUN:  10 (09/30/2010)   Creatinine:  1.0 (09/30/2010)   Albumin:  3.4 (03/06/2010)  Total Protein:  7.3 (03/06/2010)   Calcium:  8.8 (09/08/2010)   Total Bili:  0.6 (03/06/2010)   Alk Phos:  135 (03/06/2010)   SGPT (ALT):  28 (03/06/2010)   SGOT (AST):  29 (03/06/2010)   Current Medications (verified): 1)  Albuterol Sulfate (2.5 Mg/88ml) 0.083% Nebu (Albuterol Sulfate) .... Qid As Needed 2)  Spiriva Handihaler 18 Mcg Caps (Tiotropium Bromide Monohydrate) .... Once Daily 3)  Symbicort 160-4.5 Mcg/act  Aero (Budesonide-Formoterol Fumarate) .... Two Puffs Twice Daily 4)  Ventolin Hfa 108 (90 Base) Mcg/act Aers (Albuterol Sulfate) .... As Needed 5)  Klor-Con M20 20 Meq Cr-Tabs (Potassium Chloride Crys Cr) .Marland Kitchen.. 1 By Mouth Three Times A Day 6)  Oxycodone-Acetaminophen 5-325  Mg Tabs (Oxycodone-Acetaminophen) .... Take 1 Tablet By Mouth Two Times A Day - To Fill 10/24/10 7)  Omeprazole 40 Mg Cpdr (Omeprazole) .... Take 1 Capsule By Mouth Once A Day 8)  Furosemide 80 Mg Tabs (Furosemide) .Marland Kitchen.. 1 By Mouth Two Times A Day Until Further Notice 9)  Norvasc 5 Mg Tabs (Amlodipine Besylate) .Marland Kitchen.. 1 By Mouth Once Daily 10)  Zyrtec Allergy 10 Mg Tabs (Cetirizine Hcl) .... Take 1 Tablet By Mouth Once A Day 11)  Xanax 0.5 Mg Tabs (Alprazolam) .Marland Kitchen.. 1 Tablet Two Times A Day 12)  Simvastatin 10 Mg Tabs (Simvastatin) .... Take 1 At Bedtime 13)  Flonase 50 Mcg/act Susp (Fluticasone Propionate) .... 2 Sprays Each Nostril  Every Morning 14)  Oxygen .... W/ Exertion and At Night  Allergies (verified): 1)  ! Bactrim 2)  ! Doxycycline 3)  ! Zithromax 4)  Cipro 5)  * Latex  Past History:  Past Medical History: HEADACHE, CHRONIC  fibromyalgia - daily narcotics  SLEEP APNEA, noncompliant w/ CPAP   HYPERLIPIDEMIA  ANXIETY - chronic BZ use  ANEMIA PULMONARY SARCOIDOSIS--unimpressive ct chest 2011 COPD --moderate airflow obstruction, suspect is due to emphysema HYPERTENSION Asthma Allergic rhinitis Colonic polyps, hx GERD   MD roster: pulm - clance  Review of Systems  The patient denies fever, weight gain, headaches, and hemoptysis.    Physical Exam  General:  morbidly obese female in nad Mouth:  teeth and gums in good repair; mucous membranes moist, without lesions or ulcers. oropharynx clear without exudate, no erythema.  Lungs:  poor depth of inspiration, a few inspiratory wheeze initialy R base - then clears, no rhonchi or crackles Heart:  distant heart sounds but normal rate, regular rhythm, 2/6 syst murmur, and no rub. BLE with 1+ pitting edema. (improved)   Impression & Recommendations:  Problem # 1:  DYSPHAGIA UNSPECIFIED (ICD-787.20) intermit with regurg >5yr - last event 48h ago arrange modified barium swallow  Orders: Misc. Referral (Misc. Ref) T-2  View CXR (71020TC) TLB-BMP (Basic Metabolic Panel-BMET) (80048-METABOL)  Problem # 2:  CHRONIC RESPIRATORY FAILURE (ICD-518.83)  Orders: T-2 View CXR (71020TC) TLB-BMP (Basic Metabolic Panel-BMET) (80048-METABOL)  home O2 since 2010 - multifact - obst dz, MO with OHS, OSA recent pulm OV reviewed -  no wheeze - tx chronic dCHF as below - meds extensively reviewed encouraged compliance with CPAP  congrats on being done with cigarettes since 08/2010!  Problem # 3:  COPD (ICD-496)  Her updated medication list for this problem includes:    Albuterol Sulfate (2.5 Mg/37ml) 0.083% Nebu (Albuterol sulfate) ..... Qid as needed    Spiriva Handihaler 18 Mcg Caps (Tiotropium bromide monohydrate) ..... Once daily    Symbicort 160-4.5 Mcg/act Aero (Budesonide-formoterol fumarate) .Marland Kitchen..Marland Kitchen Two puffs twice daily    Ventolin  Hfa 108 (90 Base) Mcg/act Aers (Albuterol sulfate) .Marland Kitchen... As needed  Orders: T-2 View CXR (71020TC) TLB-BMP (Basic Metabolic Panel-BMET) (80048-METABOL)  no evidence for an acute exacerbation or bronchospasm by exam today.  I have asked her to continue with her current meds.  Pulmonary Functions Reviewed: O2 sat: 94 (11/11/2010)     Vaccines Reviewed: Pneumovax: Historical (06/25/2006)   Flu Vax: Historical (05/26/2010)  Problem # 4:  HYPERTENSION (ICD-401.9)  Her updated medication list for this problem includes:    Furosemide 80 Mg Tabs (Furosemide) .Marland Kitchen... 1 by mouth two times a day until further notice    Norvasc 5 Mg Tabs (Amlodipine besylate) .Marland Kitchen... 1 by mouth once daily  Orders: TLB-BMP (Basic Metabolic Panel-BMET) (80048-METABOL)  BP today: 118/76 Prior BP: 128/78 (10/20/2010)  Labs Reviewed: K+: 3.5 (09/30/2010) Creat: : 1.0 (09/30/2010)   Chol: 189 (03/06/2010)   HDL: 79 (03/06/2010)   LDL: 92 (03/06/2010)   TG: 89 (03/06/2010)  Complete Medication List: 1)  Albuterol Sulfate (2.5 Mg/29ml) 0.083% Nebu (Albuterol sulfate) .... Qid as needed 2)  Spiriva  Handihaler 18 Mcg Caps (Tiotropium bromide monohydrate) .... Once daily 3)  Symbicort 160-4.5 Mcg/act Aero (Budesonide-formoterol fumarate) .... Two puffs twice daily 4)  Ventolin Hfa 108 (90 Base) Mcg/act Aers (Albuterol sulfate) .... As needed 5)  Klor-con M20 20 Meq Cr-tabs (Potassium chloride crys cr) .Marland Kitchen.. 1 by mouth three times a day 6)  Oxycodone-acetaminophen 5-325 Mg Tabs (Oxycodone-acetaminophen) .... Take 1 tablet by mouth two times a day - to fill 10/24/10 7)  Omeprazole 40 Mg Cpdr (Omeprazole) .... Take 1 capsule by mouth once a day 8)  Furosemide 80 Mg Tabs (Furosemide) .Marland Kitchen.. 1 by mouth two times a day until further notice 9)  Norvasc 5 Mg Tabs (Amlodipine besylate) .Marland Kitchen.. 1 by mouth once daily 10)  Zyrtec Allergy 10 Mg Tabs (Cetirizine hcl) .... Take 1 tablet by mouth once a day 11)  Xanax 0.5 Mg Tabs (Alprazolam) .Marland Kitchen.. 1 tablet two times a day 12)  Simvastatin 10 Mg Tabs (Simvastatin) .... Take 1 at bedtime 13)  Flonase 50 Mcg/act Susp (Fluticasone propionate) .... 2 sprays each nostril  every morning 14)  Oxygen  .... W/ exertion and at night  Patient Instructions: 1)  it was good to see you today. 2)  weight, labs and medications reviewed -  3)  test(s) ordered today - your results will be posted on the phone tree for review in 48-72 hours from the time of test completion; call [phone] and enter your 9 digit MRN (listed above on this page, just below your name); if any changes need to be made or there are abnormal results, you will be contacted directly.  4)  continue furosemide 80mg  two times a day until further notice and potassium at 3 pills per day 5)  Please schedule a follow-up appointment in 2-3 months to recheck weight and breathing, call sooner if problems.  6)  Congrats with being done smoking!! yea!!! 7)  if your symptoms continue to worsen (pain, trouble breathing, fever, etc), or if you are unable take anything by mouth (pills, fluids, etc), you should go to the  emergency room for further evaluation and treatment.    Orders Added: 1)  Est. Patient Level IV [09811] 2)  Misc. Referral [Misc. Ref] 3)  T-2 View CXR [71020TC] 4)  TLB-BMP (Basic Metabolic Panel-BMET) [80048-METABOL]

## 2010-11-17 ENCOUNTER — Ambulatory Visit (HOSPITAL_COMMUNITY): Payer: Medicare Other

## 2010-11-18 ENCOUNTER — Ambulatory Visit (HOSPITAL_COMMUNITY): Payer: Medicare Other

## 2010-11-18 ENCOUNTER — Emergency Department (HOSPITAL_COMMUNITY): Payer: Medicare Other

## 2010-11-18 ENCOUNTER — Telehealth: Payer: Self-pay | Admitting: Internal Medicine

## 2010-11-18 ENCOUNTER — Emergency Department (HOSPITAL_COMMUNITY)
Admission: RE | Admit: 2010-11-18 | Discharge: 2010-11-18 | Disposition: A | Discharge status: Home / Self Care | Payer: Medicare Other | Source: Ambulatory Visit | Attending: Internal Medicine | Admitting: Internal Medicine

## 2010-11-18 ENCOUNTER — Ambulatory Visit (HOSPITAL_COMMUNITY)
Admission: RE | Admit: 2010-11-18 | Discharge: 2010-11-18 | Disposition: A | Discharge status: Home / Self Care | Payer: Medicare Other | Source: Ambulatory Visit | Attending: Internal Medicine | Admitting: Internal Medicine

## 2010-11-18 DIAGNOSIS — IMO0001 Reserved for inherently not codable concepts without codable children: Secondary | ICD-10-CM | POA: Insufficient documentation

## 2010-11-18 DIAGNOSIS — R0602 Shortness of breath: Secondary | ICD-10-CM | POA: Insufficient documentation

## 2010-11-18 DIAGNOSIS — D869 Sarcoidosis, unspecified: Secondary | ICD-10-CM | POA: Insufficient documentation

## 2010-11-18 DIAGNOSIS — M1990 Unspecified osteoarthritis, unspecified site: Secondary | ICD-10-CM | POA: Insufficient documentation

## 2010-11-18 DIAGNOSIS — R079 Chest pain, unspecified: Secondary | ICD-10-CM | POA: Insufficient documentation

## 2010-11-18 DIAGNOSIS — J4489 Other specified chronic obstructive pulmonary disease: Secondary | ICD-10-CM | POA: Insufficient documentation

## 2010-11-18 DIAGNOSIS — J449 Chronic obstructive pulmonary disease, unspecified: Secondary | ICD-10-CM | POA: Insufficient documentation

## 2010-11-18 DIAGNOSIS — M25559 Pain in unspecified hip: Secondary | ICD-10-CM | POA: Insufficient documentation

## 2010-11-18 DIAGNOSIS — I10 Essential (primary) hypertension: Secondary | ICD-10-CM | POA: Insufficient documentation

## 2010-11-18 DIAGNOSIS — M79609 Pain in unspecified limb: Secondary | ICD-10-CM | POA: Insufficient documentation

## 2010-11-18 DIAGNOSIS — R062 Wheezing: Secondary | ICD-10-CM | POA: Insufficient documentation

## 2010-11-18 LAB — DIFFERENTIAL
Eosinophils Absolute: 0.3 10*3/uL (ref 0.0–0.7)
Lymphocytes Relative: 29 % (ref 12–46)
Lymphs Abs: 1.2 10*3/uL (ref 0.7–4.0)
Monocytes Relative: 10 % (ref 3–12)
Neutrophils Relative %: 53 % (ref 43–77)

## 2010-11-18 LAB — CBC
HCT: 43.4 % (ref 36.0–46.0)
Hemoglobin: 13.6 g/dL (ref 12.0–15.0)
MCH: 24 pg — ABNORMAL LOW (ref 26.0–34.0)
MCV: 76.7 fL — ABNORMAL LOW (ref 78.0–100.0)
Platelets: 160 10*3/uL (ref 150–400)
RBC: 5.66 MIL/uL — ABNORMAL HIGH (ref 3.87–5.11)

## 2010-11-18 LAB — POCT I-STAT, CHEM 8
Creatinine, Ser: 1 mg/dL (ref 0.4–1.2)
Glucose, Bld: 87 mg/dL (ref 70–99)
Hemoglobin: 15 g/dL (ref 12.0–15.0)
Potassium: 3.7 mEq/L (ref 3.5–5.1)
TCO2: 27 mmol/L (ref 0–100)

## 2010-11-18 LAB — CK TOTAL AND CKMB (NOT AT ARMC)
CK, MB: 1.6 ng/mL (ref 0.3–4.0)
Total CK: 96 U/L (ref 7–177)

## 2010-11-18 LAB — TROPONIN I: Troponin I: <0.01 ng/mL (ref 0.00–0.06)

## 2010-11-18 LAB — URINALYSIS, ROUTINE W REFLEX MICROSCOPIC
Hgb urine dipstick: NEGATIVE
Protein, ur: NEGATIVE mg/dL
Urine Glucose, Fasting: NEGATIVE mg/dL
pH: 6 (ref 5.0–8.0)

## 2010-11-18 LAB — D-DIMER, QUANTITATIVE: D-Dimer, Quant: 1.29 ug/mL-FEU — ABNORMAL HIGH (ref 0.00–0.48)

## 2010-11-18 MED ORDER — IOHEXOL 300 MG/ML  SOLN
100.0000 mL | Freq: Once | INTRAMUSCULAR | Status: AC | PRN
  Administered 2010-11-18: 100 mL via INTRAVENOUS

## 2010-11-21 ENCOUNTER — Telehealth (INDEPENDENT_AMBULATORY_CARE_PROVIDER_SITE_OTHER): Payer: Self-pay | Admitting: *Deleted

## 2010-11-21 ENCOUNTER — Telehealth: Payer: Self-pay | Admitting: Internal Medicine

## 2010-11-22 ENCOUNTER — Encounter: Payer: Self-pay | Admitting: Pulmonary Disease

## 2010-11-22 ENCOUNTER — Telehealth: Payer: Self-pay | Admitting: Internal Medicine

## 2010-11-22 ENCOUNTER — Ambulatory Visit (INDEPENDENT_AMBULATORY_CARE_PROVIDER_SITE_OTHER): Payer: Medicare Other | Admitting: Pulmonary Disease

## 2010-11-22 DIAGNOSIS — D869 Sarcoidosis, unspecified: Secondary | ICD-10-CM

## 2010-11-22 DIAGNOSIS — J9610 Chronic respiratory failure, unspecified whether with hypoxia or hypercapnia: Secondary | ICD-10-CM

## 2010-11-22 DIAGNOSIS — J449 Chronic obstructive pulmonary disease, unspecified: Secondary | ICD-10-CM

## 2010-11-22 DIAGNOSIS — J4489 Other specified chronic obstructive pulmonary disease: Secondary | ICD-10-CM

## 2010-11-22 NOTE — Letter (Signed)
Summary: [facility]  [facility]   Imported By: Lennie Odor 11/17/2010 16:43:01  _____________________________________________________________________  External Attachment:    Type:   Image     Comment:   External Document

## 2010-11-22 NOTE — Progress Notes (Signed)
Summary: FYI - speech therapist and delay in mbs  Phone Note From Other Clinic Call back at Work Phone [phone]   Caller: Reinaldo Raddle [phone] Details for Reason: FYI Summary of Call: Want to let md know that pt came in to have her Barrium Swallow this am. Pt was complaining of have pain on 1 side of body, SOB. Tammy states she didnt do test sent pt to ED. Did let pt know if they don't admit her will try to do Barrium swallow this pm. Initial call taken by: Orlan Leavens RMA,  November 18, 2010 11:12 AM  Follow-up for Phone Call        ok  - noted - thanks Follow-up by: Newt Lukes MD,  November 18, 2010 2:00 PM

## 2010-11-23 ENCOUNTER — Ambulatory Visit (HOSPITAL_COMMUNITY): Payer: Medicare Other | Attending: Pulmonary Disease

## 2010-11-23 DIAGNOSIS — I079 Rheumatic tricuspid valve disease, unspecified: Secondary | ICD-10-CM | POA: Insufficient documentation

## 2010-11-23 DIAGNOSIS — J4489 Other specified chronic obstructive pulmonary disease: Secondary | ICD-10-CM | POA: Insufficient documentation

## 2010-11-23 DIAGNOSIS — E785 Hyperlipidemia, unspecified: Secondary | ICD-10-CM | POA: Insufficient documentation

## 2010-11-23 DIAGNOSIS — J449 Chronic obstructive pulmonary disease, unspecified: Secondary | ICD-10-CM | POA: Insufficient documentation

## 2010-11-23 DIAGNOSIS — M7989 Other specified soft tissue disorders: Secondary | ICD-10-CM | POA: Insufficient documentation

## 2010-11-23 DIAGNOSIS — F17200 Nicotine dependence, unspecified, uncomplicated: Secondary | ICD-10-CM | POA: Insufficient documentation

## 2010-11-23 DIAGNOSIS — R0989 Other specified symptoms and signs involving the circulatory and respiratory systems: Secondary | ICD-10-CM | POA: Insufficient documentation

## 2010-11-23 DIAGNOSIS — R0609 Other forms of dyspnea: Secondary | ICD-10-CM | POA: Insufficient documentation

## 2010-11-25 ENCOUNTER — Telehealth: Payer: Self-pay | Admitting: Pulmonary Disease

## 2010-11-25 ENCOUNTER — Encounter: Payer: Self-pay | Admitting: Internal Medicine

## 2010-11-25 ENCOUNTER — Telehealth: Payer: Self-pay | Admitting: Internal Medicine

## 2010-11-28 ENCOUNTER — Telehealth: Payer: Self-pay | Admitting: Internal Medicine

## 2010-11-30 ENCOUNTER — Other Ambulatory Visit: Payer: Self-pay | Admitting: Pulmonary Disease

## 2010-11-30 ENCOUNTER — Encounter: Payer: Self-pay | Admitting: Pulmonary Disease

## 2010-11-30 ENCOUNTER — Ambulatory Visit (INDEPENDENT_AMBULATORY_CARE_PROVIDER_SITE_OTHER): Payer: Medicare Other | Admitting: Pulmonary Disease

## 2010-11-30 ENCOUNTER — Other Ambulatory Visit: Payer: Medicare Other

## 2010-11-30 ENCOUNTER — Ambulatory Visit: Payer: Medicare Other | Admitting: Pulmonary Disease

## 2010-11-30 DIAGNOSIS — R0602 Shortness of breath: Secondary | ICD-10-CM

## 2010-11-30 DIAGNOSIS — I5033 Acute on chronic diastolic (congestive) heart failure: Secondary | ICD-10-CM

## 2010-11-30 DIAGNOSIS — J9610 Chronic respiratory failure, unspecified whether with hypoxia or hypercapnia: Secondary | ICD-10-CM

## 2010-11-30 DIAGNOSIS — R0989 Other specified symptoms and signs involving the circulatory and respiratory systems: Secondary | ICD-10-CM

## 2010-11-30 DIAGNOSIS — J449 Chronic obstructive pulmonary disease, unspecified: Secondary | ICD-10-CM

## 2010-11-30 DIAGNOSIS — G4730 Sleep apnea, unspecified: Secondary | ICD-10-CM

## 2010-11-30 LAB — BASIC METABOLIC PANEL
Calcium: 9.1 mg/dL (ref 8.4–10.5)
GFR: 82.25 mL/min (ref >60.00)
Potassium: 3.7 mEq/L (ref 3.5–5.1)
Sodium: 139 mEq/L (ref 135–145)

## 2010-11-30 LAB — BRAIN NATRIURETIC PEPTIDE: Pro B Natriuretic peptide (BNP): 10.2 pg/mL (ref 0.0–100.0)

## 2010-12-01 ENCOUNTER — Encounter: Payer: Self-pay | Admitting: Internal Medicine

## 2010-12-01 NOTE — Progress Notes (Signed)
Summary: FYI  Phone Note Call from Patient Call back at [facility] Phone [phone]   Caller: Patient Call For: [name] Summary of Call: FYI: Pt wants KC to know she went to ER at [facility] Friday re: copd and false positive for blood clot, she has appt tomorrow. Initial call taken by: Darletta Moll,  November 21, 2010 2:30 PM  Follow-up for Phone Call        Will forward to [facility] as FYI. Pt has ov for tommorrow at 3 pm. Follow-up by: Vernie Murders,  November 21, 2010 4:12 PM

## 2010-12-01 NOTE — Progress Notes (Signed)
Summary: spiriva & albuterol  Phone Note Call from Patient   Caller: Patient Reason for Call: Refill Medication Summary of Call: Requesting refilll on Spiriva & albuterol  Initial call taken by: Orlan Leavens RMA,  November 25, 2010 2:25 PM    Prescriptions: SPIRIVA HANDIHALER 18 MCG CAPS (TIOTROPIUM BROMIDE MONOHYDRATE) once daily  #3 x 1   Entered by:   Orlan Leavens RMA   Authorized by:   Newt Lukes MD   Signed by:   Orlan Leavens RMA on 11/25/2010   Method used:   Electronically to        [facility] [address] [phone]* (retail)       [address]       [place], [place]  [address]       Ph: [phone] or [phone]       Fax: [phone]   RxID:   [id number] ALBUTEROL SULFATE (2.5 MG/3ML) 0.083% NEBU (ALBUTEROL SULFATE) once daily  #30 x 1   Entered by:   Orlan Leavens RMA   Authorized by:   Newt Lukes MD   Signed by:   Orlan Leavens RMA on 11/25/2010   Method used:   Electronically to        [facility] [address] [phone]* (retail)       [address]       [place], [place]  [address]       Ph: [phone] or [phone]       Fax: [phone]   RxID:   [id number]

## 2010-12-01 NOTE — Progress Notes (Signed)
Summary: RX REQUEST  Phone Note Call from Patient Call back at Home Phone [phone]   Caller: Patient Call For: Yvette Lukes MD Summary of Call: Pt is requesting refill forALPRAZOLAM 0.5MG  and pt also need refill for  OXYCONDONE/ACETAMINOPHEN 5-325MG , but pt was wondering if Dr. Felicity Coyer could change from OXYCONDONE TO HYDROCODONE 10/500MG . Pt stated that it seem to be working better for her and oxycodone made her sick when she has to double the med. Please advise.  Initial call taken by: Livingston Diones,  November 22, 2010 3:56 PM  Follow-up for Phone Call        will need OV to discuss any change in pain med dose - refill at prior dose provided as well as requested xanax - may pick up both printed rx Follow-up by: Yvette Lukes MD,  November 23, 2010 8:20 AM  Additional Follow-up for Phone Call Additional follow up Details #1::        no answer, no VM, Rx in cabinet for pt pick up. Margaret Pyle, CMA  November 23, 2010 8:48 AM   Pt advised of Rxs and need for ROV if changes to Rxs need ot be made Additional Follow-up by: Margaret Pyle, CMA,  November 24, 2010 8:38 AM    New/Updated Medications: OXYCODONE-ACETAMINOPHEN 5-325 MG TABS (OXYCODONE-ACETAMINOPHEN) Take 1 tablet by mouth two times a day Prescriptions: XANAX 0.5 MG TABS (ALPRAZOLAM) 1 tablet two times a day  #60 x 1   Entered and Authorized by:   Yvette Lukes MD   Signed by:   Yvette Lukes MD on 11/23/2010   Method used:   Print then Give to Patient   RxID:   [id number] OXYCODONE-ACETAMINOPHEN 5-325 MG TABS (OXYCODONE-ACETAMINOPHEN) Take 1 tablet by mouth two times a day  #60 x 0   Entered and Authorized by:   Yvette Lukes MD   Signed by:   Yvette Lukes MD on 11/23/2010   Method used:   Print then Give to Patient   RxID:   [id number]

## 2010-12-01 NOTE — Assessment & Plan Note (Signed)
Summary: acute sick visit for dyspnea   Primary Provider/Referring Provider:  Newt Lukes MD  CC:  Follow up  appt.  pt states she went to [facility] ED on Friday d/t increased sob and chest pain.  Pt states she "tested pos. for blood clot" but ct was negative.  pt still c/o increased sob with exertion and at rest.  Pt also c/o intermit. L sided chest pain.  Pt states she quit smoking.  and Headache.  History of Present Illness: the pt comes in today for an acute visit.  She has known chronic respiratory failure secondary to emphysema, probable OHS, diastolic dysfunction with edema, and also h/o OSA for which she is not being treated currently.  She also has a h/o sarcoidosis which is not felt to be overly significant radiographically.  At the last visit, she was having issues with increasing weight and edema, and this was felt to be related to diastolic dysfunction.  Her diuretic dose was increased, but she did not see a significant change in her quantity of urine or LE edema.  In fact, her weight is up 1 pound further today.  She did have to go to ER end of last week for worsening sob, and they did a ct chest.  This showed a lot of movement artifact, no moderate to large PE (small PE's could not be excluded due to movement and technical issues), significant emphysema changes, and minimal LN that was new.  She really did not have any specific treatment while there.  She comes in today where she is still having sob, but no cough or congestion.  Her sats fall with ambulation, but return to adequate level with short rest.  She admits that she is not taking her diuretic compliantly at times.  She will take am dose after lunch because of travelling away from home on some days.      Medications Prior to Update: 1)  Albuterol Sulfate (2.5 Mg/51ml) 0.083% Nebu (Albuterol Sulfate) .... Qid As Needed 2)  Spiriva Handihaler 18 Mcg Caps (Tiotropium Bromide Monohydrate) .... Once Daily 3)  Symbicort 160-4.5 Mcg/act   Aero (Budesonide-Formoterol Fumarate) .... Two Puffs Twice Daily 4)  Ventolin Hfa 108 (90 Base) Mcg/act Aers (Albuterol Sulfate) .... As Needed 5)  Klor-Con M20 20 Meq Cr-Tabs (Potassium Chloride Crys Cr) .Marland Kitchen.. 1 By Mouth Three Times A Day 6)  Oxycodone-Acetaminophen 5-325 Mg Tabs (Oxycodone-Acetaminophen) .... Take 1 Tablet By Mouth Two Times A Day - To Fill 10/24/10 7)  Omeprazole 40 Mg Cpdr (Omeprazole) .... Take 1 Capsule By Mouth Once A Day 8)  Furosemide 80 Mg Tabs (Furosemide) .Marland Kitchen.. 1 By Mouth Two Times A Day Until Further Notice 9)  Norvasc 5 Mg Tabs (Amlodipine Besylate) .Marland Kitchen.. 1 By Mouth Once Daily 10)  Zyrtec Allergy 10 Mg Tabs (Cetirizine Hcl) .... Take 1 Tablet By Mouth Once A Day 11)  Xanax 0.5 Mg Tabs (Alprazolam) .Marland Kitchen.. 1 Tablet Two Times A Day 12)  Simvastatin 10 Mg Tabs (Simvastatin) .... Take 1 At Bedtime 13)  Flonase 50 Mcg/act Susp (Fluticasone Propionate) .... 2 Sprays Each Nostril  Every Morning 14)  Oxygen .... W/ Exertion and At Night  Allergies (verified): 1)  ! Bactrim 2)  ! Doxycycline 3)  ! Zithromax 4)  Cipro 5)  * Latex  Past History:  Past medical, surgical, family and social histories (including risk factors) reviewed, and no changes noted (except as noted below).  Past Medical History: Reviewed history from 11/11/2010 and no changes  required. 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 polyp removed - 2011   Family History: Reviewed history from 08/26/2010 and no changes required. emphysema-father allergies-brother heart disease-mother, father, brother stomach  cancer-father, brother  dad expired age 37 - stomach ca mom expired age 73 - dementia  Social History: Reviewed history from 08/26/2010 and no changes required. Patient is a current smoker.  1ppd x 61yrs.   Currently down to <1/2ppd. pt quit smoking as of Dec. 2011.  lives alone single no children disability  Review of Systems       The patient complains of shortness of breath with activity, shortness of breath at rest, non-productive cough, chest pain, acid heartburn, indigestion, loss of appetite, weight change, headaches, nasal congestion/difficulty breathing through nose, anxiety, hand/feet swelling, and joint stiffness or pain.  The patient denies productive cough, coughing up blood, irregular heartbeats, abdominal pain, difficulty swallowing, sore throat, tooth/dental problems, sneezing, itching, ear ache, depression, rash, change in color of mucus, and fever.    Vital Signs:  Patient profile:   45 year old female Height:      68.5 inches Weight:      313.38 pounds BMI:     47.13 O2 Sat:      75 % on 2 LPM cont Temp:     97.7 degrees F oral Pulse rate:   92 / minute BP sitting:   134 / 86  (right arm) Cuff size:   large  Vitals Entered By: Arman Filter LPN (November 22, 2010 2:47 PM)  O2 Flow:  2 LPM cont CC: Follow up  appt.  pt states she went to [facility] ED on Friday d/t increased sob and chest pain.  Pt states she "tested pos. for blood clot" but ct was negative.  pt still c/o increased sob with exertion and at rest.  Pt also c/o intermit. L sided chest pain.  Pt states she quit smoking. , Headache Comments rechecked pt's o2 sats after pt rested and took some deep breaths.  o2 sats increased to 93 % on 2 LPM cont. Medications reviewed with patient Arman Filter LPN  November 22, 2010 2:47 PM    Physical Exam  General:  obese female in nad Nose:  no purulence or discharge noted. Lungs:  basilar crackles, no wheezing  Heart:  rrr Extremities:  2+ edema, no cyanosis    Neurologic:  alert and oriented, moves all 4     Impression & Recommendations:  Problem # 1:  CHRONIC RESPIRATORY FAILURE (ICD-518.83) the pt has chronic RF, and now more sob over her usual baseline.  She clearly has volume overload, and did not respond as expected to increased diuretic dose.  However, I question whether she took this compliantly?  I wonder if she has significant pulmonary htn related to her multiple medical issues which may be contributing to her poor response to diuretics and dyspnea.  Will check echo to evaluate cardiac function and to look at PA pressures.  She may ultimately need a RHC to put this issue to rest.  She also has a h/o osa of unknown severity, and has been noncompliant with her cpap.  I am  still trying to get this sleep study sent to me from [facility], and will work on getting her back on cpap if she has severe disease.    Problem # 2:  COPD (ICD-496) the pt has known emphysema with chronic airflow obstruction.  She is on a good medical regimen, and really does not have any bronchospasm noted today on exam.  I do not think her worsening dyspnea is due to this.    Problem # 3:  PULMONARY SARCOIDOSIS (ICD-135) the pt has a h/o sarcoidosis, but her ct in the past has not been overly impressive for significant lung involvement.  She has a little more IS process on her more recent scan, along with mild LN....I think this is due to engorged lymphatics from probable volume overload rather than an inflammatory process.    Medications Added to Medication List This Visit: 1)  Albuterol Sulfate (2.5 Mg/62ml) 0.083% Nebu (Albuterol sulfate) .... Once daily  Other Orders: Est. Patient Level IV (96045) Echo Referral (Echo)  Patient Instructions: 1)  increase furosemide to 1 1/2 in am and one in pm.  Do not miss doses! 2)  will order echo of your heart to re-evaluate your pressures. 3)  will get copy of sleep study from [place], and try to get you restarted on cpap. 4)  increase  oxygen to 3 liters when you are moving, and 2 liters at rest. 5)  followup with me next week.   Prescriptions: SPIRIVA HANDIHALER 18 MCG CAPS (TIOTROPIUM BROMIDE MONOHYDRATE) once daily  #3 x 1   Entered by:   Kandice Hams CMA   Authorized by:   Barbaraann Share MD   Signed by:   Kandice Hams CMA on 11/22/2010   Method used:   Print then Give to Patient   RxID:   [id number]

## 2010-12-01 NOTE — Progress Notes (Signed)
Summary: Call Report  Phone Note Other Incoming   Caller: [facility] Summary of Call: [facility] Triage Call Report Triage Record Num: [id number] Operator: Meribeth Mattes Patient Name: [name] Call Date & Time: 11/19/2010 8:18:43AM Patient Phone: [phone] PCP: Rene Paci Patient Gender: Female PCP Fax : [phone] Patient DOB: September 07, 1966 Practice Name: [facility] Reason for Call: Was seen at [facility] ER 11/18/10, dx COPD, feeling same today, not any worse, does not have labored breathing, afebrile, using inhaler, with relief, taking fluids fine, was told to be seen in office within next 3 days, has appt Tues with pulmonary. Call back if sx worsens. F/u with PCP office on Monday. Protocol(s) Used: Breathing Problems Recommended Outcome per Protocol: See Provider within 2 Weeks Reason for Outcome: Known chronic lung disease AND symptoms are slowly worsening Care Advice: Continue to follow your treatment plan. Take all medications as prescribed and keep all appointments with your provider.  ~  ~ Follow your action plan for relief of symptoms, including using your quick relief medications. Provide for rest periods by spacing activities that require physical exertion. Promote calm, restful environment; anxiety increases breathing difficulty.  ~ Follow your treatment plan as given to you by your provider to manage symptoms, including reducing salt intake, limiting intake of fluids, monitoring daily weights, and exercising as instructed.  ~ 02/ Initial call taken by: Margaret Pyle, CMA,  November 21, 2010 8:18 AM  Follow-up for Phone Call        ok Follow-up by: Newt Lukes MD,  November 21, 2010 8:24 AM

## 2010-12-01 NOTE — Letter (Signed)
Summary: [facility] Order / [facility]  [facility] Order / [facility]   Imported By: Lennie Odor 11/18/2010 11:48:09  _____________________________________________________________________  External Attachment:    Type:   Image     Comment:   External Document

## 2010-12-06 LAB — DIFFERENTIAL
Lymphocytes Relative: 27 % (ref 12–46)
Lymphs Abs: 1.2 10*3/uL (ref 0.7–4.0)
Monocytes Relative: 14 % — ABNORMAL HIGH (ref 3–12)
Neutro Abs: 2.2 10*3/uL (ref 1.7–7.7)
Neutrophils Relative %: 52 % (ref 43–77)

## 2010-12-06 LAB — POCT I-STAT, CHEM 8
Chloride: 101 mEq/L (ref 96–112)
Creatinine, Ser: 1.1 mg/dL (ref 0.4–1.2)
Glucose, Bld: 105 mg/dL — ABNORMAL HIGH (ref 70–99)
HCT: 43 % (ref 36.0–46.0)
Potassium: 3.8 mEq/L (ref 3.5–5.1)

## 2010-12-06 LAB — URINALYSIS, ROUTINE W REFLEX MICROSCOPIC
Glucose, UA: NEGATIVE mg/dL
Hgb urine dipstick: NEGATIVE
Specific Gravity, Urine: 1.022 (ref 1.005–1.030)
Urobilinogen, UA: 1 mg/dL (ref 0.0–1.0)

## 2010-12-06 LAB — CBC
Hemoglobin: 13.5 g/dL (ref 12.0–15.0)
RBC: 5.37 MIL/uL — ABNORMAL HIGH (ref 3.87–5.11)
WBC: 4.3 10*3/uL (ref 4.0–10.5)

## 2010-12-06 NOTE — Medication Information (Signed)
Summary: Albuterol / [facility]  Albuterol / [facility]   Imported By: Lennie Odor 12/02/2010 10:52:04  _____________________________________________________________________  External Attachment:    Type:   Image     Comment:   External Document

## 2010-12-06 NOTE — Progress Notes (Signed)
Summary: Echo results-  Phone Note Call from Patient Call back at [phone]   Caller: Patient Reason for Call: Lab or Test Results Summary of Call: Patient walked in to get results of echo done at end of February.  Please call patient with results. Initial call taken by: Leonette Monarch,  November 25, 2010 3:02 PM  Follow-up for Phone Call        Pls advise results thanks Vernie Murders  November 25, 2010 4:15 PM    Additional Follow-up for Phone Call Additional follow up Details #1::        let pt know that it was a difficult study to see because of her weight....the heart appeared to be ok, but I was trying to get  a pressure measurement that they were unable to do.  will talk about this at visit next week.  sTill awaiting sleep study from [place] so we can get you back on cpap.  stay on the higher doses of fluid medication, and take twice a day everyday.  If breathing is worsening, please let us know.   Additional Follow-up by: Barbaraann Share MD,  November 25, 2010 5:44 PM    Additional Follow-up for Phone Call Additional follow up Details #2::    Unable to reach pt on # provided per phone note. Called pt's home # and advised pt of KC's recs. Pt has follow up with [facility] 11/30/10 and I advised pt to give [place] a call before then if needed. Zackery Barefoot CMA  November 28, 2010 8:53 AM

## 2010-12-06 NOTE — Medication Information (Signed)
Summary: Diabetic Supplies Denied / [facility]  Diabetic Supplies Denied / [facility]   Imported By: Lennie Odor 11/30/2010 10:52:23  _____________________________________________________________________  External Attachment:    Type:   Image     Comment:   External Document

## 2010-12-06 NOTE — Progress Notes (Signed)
Summary: albuterol nebulizer  Phone Note From Pharmacy   [facility] [address] Call For: Dr. Annye Rusk  Summary of Call: Have script for Albuterol 0.083% for nebulizer. Pt states she is using four times a day & would like more than 1 box. Pls advise Initial call taken by: Orlan Leavens RMA,  November 28, 2010 12:09 PM  Follow-up for Phone Call        ok to fill #120 alb neb tx per mo - thanks Follow-up by: Newt Lukes MD,  November 28, 2010 3:41 PM  Additional Follow-up for Phone Call Additional follow up Details #1::        [facility] Additional Follow-up by: Orlan Leavens RMA,  November 28, 2010 3:45 PM    Prescriptions: ALBUTEROL SULFATE (2.5 MG/3ML) 0.083% NEBU (ALBUTEROL SULFATE) once daily  #120 x 1   Entered by:   Orlan Leavens RMA   Authorized by:   Newt Lukes MD   Signed by:   Orlan Leavens RMA on 11/28/2010   Method used:   [facility]        [facility] [address] [phone]* (retail)       [address]       [place], [place]  [address]       Ph: [phone] or [phone]       Fax: [phone]   RxID:   [id number]

## 2010-12-07 ENCOUNTER — Telehealth (INDEPENDENT_AMBULATORY_CARE_PROVIDER_SITE_OTHER): Payer: Self-pay | Admitting: *Deleted

## 2010-12-07 DIAGNOSIS — G4733 Obstructive sleep apnea (adult) (pediatric): Secondary | ICD-10-CM | POA: Insufficient documentation

## 2010-12-08 ENCOUNTER — Ambulatory Visit (INDEPENDENT_AMBULATORY_CARE_PROVIDER_SITE_OTHER): Payer: Medicare Other | Admitting: Internal Medicine

## 2010-12-08 ENCOUNTER — Encounter: Payer: Self-pay | Admitting: Internal Medicine

## 2010-12-08 DIAGNOSIS — I5033 Acute on chronic diastolic (congestive) heart failure: Secondary | ICD-10-CM

## 2010-12-08 DIAGNOSIS — IMO0001 Reserved for inherently not codable concepts without codable children: Secondary | ICD-10-CM

## 2010-12-08 LAB — CULTURE, BLOOD (SINGLE)
Culture  Setup Time: 201109290246
Culture: NO GROWTH

## 2010-12-08 LAB — COMPREHENSIVE METABOLIC PANEL
ALT: 28 U/L (ref 0–35)
Alkaline Phosphatase: 135 U/L — ABNORMAL HIGH (ref 39–117)
CO2: 25 mEq/L (ref 19–32)
Calcium: 8.8 mg/dL (ref 8.4–10.5)
GFR calc non Af Amer: 58 mL/min — ABNORMAL LOW (ref >60)
Glucose, Bld: 113 mg/dL — ABNORMAL HIGH (ref 70–99)
Sodium: 135 mEq/L (ref 135–145)

## 2010-12-08 LAB — BASIC METABOLIC PANEL
CO2: 28 mEq/L (ref 19–32)
Calcium: 8.6 mg/dL (ref 8.4–10.5)
Chloride: 103 mEq/L (ref 96–112)
Glucose, Bld: 96 mg/dL (ref 70–99)
Sodium: 137 mEq/L (ref 135–145)

## 2010-12-08 LAB — GLUCOSE, CAPILLARY
Glucose-Capillary: 107 mg/dL — ABNORMAL HIGH (ref 70–99)
Glucose-Capillary: 114 mg/dL — ABNORMAL HIGH (ref 70–99)
Glucose-Capillary: 126 mg/dL — ABNORMAL HIGH (ref 70–99)
Glucose-Capillary: 129 mg/dL — ABNORMAL HIGH (ref 70–99)
Glucose-Capillary: 134 mg/dL — ABNORMAL HIGH (ref 70–99)
Glucose-Capillary: 136 mg/dL — ABNORMAL HIGH (ref 70–99)
Glucose-Capillary: 147 mg/dL — ABNORMAL HIGH (ref 70–99)
Glucose-Capillary: 155 mg/dL — ABNORMAL HIGH (ref 70–99)
Glucose-Capillary: 172 mg/dL — ABNORMAL HIGH (ref 70–99)
Glucose-Capillary: 77 mg/dL (ref 70–99)
Glucose-Capillary: 120 mg/dL — ABNORMAL HIGH (ref 70–99)
Glucose-Capillary: 161 mg/dL — ABNORMAL HIGH (ref 70–99)

## 2010-12-08 LAB — CULTURE, RESPIRATORY W GRAM STAIN: Culture: NORMAL

## 2010-12-08 LAB — DIFFERENTIAL
Basophils Absolute: 0 10*3/uL (ref 0.0–0.1)
Basophils Relative: 1 % (ref 0–1)
Eosinophils Absolute: 0.3 10*3/uL (ref 0.0–0.7)
Lymphs Abs: 1.1 10*3/uL (ref 0.7–4.0)
Neutrophils Relative %: 57 % (ref 43–77)

## 2010-12-08 LAB — CBC
HCT: 43.8 % (ref 36.0–46.0)
HCT: 44.6 % (ref 36.0–46.0)
Hemoglobin: 13.6 g/dL (ref 12.0–15.0)
Hemoglobin: 14.4 g/dL (ref 12.0–15.0)
MCH: 24.5 pg — ABNORMAL LOW (ref 26.0–34.0)
MCHC: 31.1 g/dL (ref 30.0–36.0)
MCHC: 32.3 g/dL (ref 30.0–36.0)
MCV: 79.1 fL (ref 78.0–100.0)

## 2010-12-08 LAB — EXPECTORATED SPUTUM ASSESSMENT W GRAM STAIN, RFLX TO RESP C

## 2010-12-11 LAB — BASIC METABOLIC PANEL
CO2: 33 mEq/L — ABNORMAL HIGH (ref 19–32)
Calcium: 9 mg/dL (ref 8.4–10.5)
Chloride: 100 mEq/L (ref 96–112)
GFR calc Af Amer: >60 mL/min (ref >60)
Sodium: 137 mEq/L (ref 135–145)

## 2010-12-11 LAB — URINALYSIS, ROUTINE W REFLEX MICROSCOPIC
Bilirubin Urine: NEGATIVE
Glucose, UA: NEGATIVE mg/dL
Ketones, ur: NEGATIVE mg/dL
Protein, ur: NEGATIVE mg/dL

## 2010-12-11 LAB — URINE CULTURE: Colony Count: NO GROWTH

## 2010-12-12 LAB — GLUCOSE, CAPILLARY
Glucose-Capillary: 152 mg/dL — ABNORMAL HIGH (ref 70–99)
Glucose-Capillary: 99 mg/dL (ref 70–99)

## 2010-12-12 LAB — DIFFERENTIAL
Lymphocytes Relative: 32 % (ref 12–46)
Lymphs Abs: 1.3 10*3/uL (ref 0.7–4.0)
Monocytes Absolute: 0.4 10*3/uL (ref 0.1–1.0)
Monocytes Relative: 9 % (ref 3–12)
Neutro Abs: 2 10*3/uL (ref 1.7–7.7)
Neutrophils Relative %: 50 % (ref 43–77)

## 2010-12-12 LAB — LIPID PANEL
Cholesterol: 189 mg/dL (ref 0–200)
HDL: 79 mg/dL (ref >39)
Total CHOL/HDL Ratio: 2.4 RATIO
Triglycerides: 89 mg/dL (ref <150)

## 2010-12-12 LAB — COMPREHENSIVE METABOLIC PANEL
Albumin: 3.7 g/dL (ref 3.5–5.2)
Alkaline Phosphatase: 98 U/L (ref 39–117)
BUN: 7 mg/dL (ref 6–23)
Calcium: 9.2 mg/dL (ref 8.4–10.5)
Creatinine, Ser: 0.93 mg/dL (ref 0.4–1.2)
Glucose, Bld: 78 mg/dL (ref 70–99)
Potassium: 3.6 mEq/L (ref 3.5–5.1)
Total Protein: 7.9 g/dL (ref 6.0–8.3)

## 2010-12-12 LAB — CULTURE, BLOOD (ROUTINE X 2)
Culture: NO GROWTH
Culture: NO GROWTH

## 2010-12-12 LAB — CBC
HCT: 41.4 % (ref 36.0–46.0)
MCHC: 32.3 g/dL (ref 30.0–36.0)
Platelets: 244 10*3/uL (ref 150–400)
RDW: 23.2 % — ABNORMAL HIGH (ref 11.5–15.5)

## 2010-12-12 LAB — VITAMIN D 1,25 DIHYDROXY: Vitamin D2 1, 25 (OH)2: <8 pg/mL

## 2010-12-13 NOTE — Letter (Signed)
Summary: 02/09/10-07/13/10 / [facility]  02/09/10-07/13/10 / [facility]   Imported By: Lennie Odor 12/06/2010 12:40:48  _____________________________________________________________________  External Attachment:    Type:   Image     Comment:   External Document

## 2010-12-13 NOTE — Assessment & Plan Note (Signed)
Summary: rov for chronic respiratory failure   Copy to:  Dr. Anson Fret Primary Provider/Referring Provider:  Newt Lukes MD  CC:  1 week f/u appt.  Pt states her breathing is "so so."  States she hasn't seen much "change" from last week.   C/o productive cough with yellow sputum.  Pt requests a new rx for Albuterol nebs. .  History of Present Illness: the pt comes in today for f/u of her multifactorial respiratory failure and dyspnea.  At the last visit, her lasix dose was increased, and she has lost 8 pounds since doing so.  Despite this, she feels her breathing has "not improved that much".  She also has OHS/OSA, emphysema, and significant diastolic dysfunction.  She has had a repeat echo which showed nl LV fxn, mildly dilated LA, pulmonary pressures not estimated due to poor window?.  I have finally received her sleep study from [place], and she did have mild osa in 2009, but that was 65pounds ago.     Current Medications (verified): 1)  Albuterol Sulfate (2.5 Mg/40ml) 0.083% Nebu (Albuterol Sulfate) .Marland Kitchen.. 1 Vial in Nebulizer Every 4 To 6 Hours As Needed 2)  Spiriva Handihaler 18 Mcg Caps (Tiotropium Bromide Monohydrate) .... Once Daily 3)  Symbicort 160-4.5 Mcg/act  Aero (Budesonide-Formoterol Fumarate) .... Two Puffs Twice Daily 4)  Ventolin Hfa 108 (90 Base) Mcg/act Aers (Albuterol Sulfate) .... As Needed 5)  Klor-Con M20 20 Meq Cr-Tabs (Potassium Chloride Crys Cr) .Marland Kitchen.. 1 By Mouth Three Times A Day 6)  Oxycodone-Acetaminophen 5-325 Mg Tabs (Oxycodone-Acetaminophen) .... Take 1 Tablet By Mouth Two Times A Day 7)  Omeprazole 40 Mg Cpdr (Omeprazole) .... Take 1 Capsule By Mouth Once A Day 8)  Furosemide 80 Mg Tabs (Furosemide) .... Take 1 1/2 Tab By Mouth Each Morning and 1 Tab By Mouth Each Afternoon 9)  Norvasc 5 Mg Tabs (Amlodipine Besylate) .Marland Kitchen.. 1 By Mouth Once Daily 10)  Xanax 0.5 Mg Tabs (Alprazolam) .Marland Kitchen.. 1 Tablet Two Times A Day 11)  Flonase 50 Mcg/act Susp (Fluticasone Propionate)  .... 2 Sprays Each Nostril  Every Morning 12)  Oxygen .... W/ Exertion and At Night  Allergies (verified): 1)  ! Bactrim 2)  ! Doxycycline 3)  ! Zithromax 4)  Cipro 5)  * Latex  Past History:  Past medical, surgical, family and social histories (including risk factors) reviewed, and no changes noted (except as noted below).  Past Medical History: Reviewed history from 11/11/2010 and no changes required. HEADACHE, CHRONIC  fibromyalgia - daily narcotics  SLEEP APNEA, noncompliant w/ CPAP   HYPERLIPIDEMIA  ANXIETY - chronic BZ use  ANEMIA PULMONARY SARCOIDOSIS--unimpressive ct chest 2011 COPD --moderate airflow obstruction, suspect is due to emphysema HYPERTENSION Asthma Allergic rhinitis Colonic polyps, hx GERD   MD roster: pulm - clance  Past Surgical History: Reviewed history from 08/26/2010 and no changes required. polyp removed - 2011   Family History: Reviewed history from 08/26/2010 and no changes required. emphysema-father allergies-brother heart disease-mother, father, brother stomach cancer-father, brother  dad expired age 74 - stomach ca mom expired age 44 - dementia  Social History: Reviewed history from 11/22/2010 and no changes required. Patient is a current smoker.  1ppd x 55yrs.   Currently down to <1/2ppd. pt quit smoking as of Dec. 2011.  lives alone single no children disability  Review of Systems       The patient complains of shortness of breath with activity, productive cough, chest pain, weight change, nasal congestion/difficulty breathing through nose,  anxiety, depression, hand/feet swelling, joint stiffness or pain, and change in color of mucus.  The patient denies shortness of breath at rest, non-productive cough, coughing up blood, irregular heartbeats, acid heartburn, indigestion, loss of appetite, abdominal pain, difficulty swallowing, sore throat, tooth/dental problems, headaches, sneezing, itching, ear ache, rash, and fever.     Vital Signs:  Patient profile:   45 year old female Height:      68.5 inches Weight:      305 pounds BMI:     45.87 O2 Sat:      96 % on 2 LPM cont.  Temp:     98.4 degrees F oral Pulse rate:   90 / minute BP sitting:   114 / 70  (left arm) Cuff size:   large  Vitals Entered By: Arman Filter LPN (November 29, 452 1:48 PM)  O2 Flow:  2 LPM cont.  CC: 1 week f/u appt.  Pt states her breathing is "so so."  States she hasn't seen much "change" from last week.   C/o productive cough with yellow sputum.  Pt requests a new rx for Albuterol nebs.  Comments Medications reviewed with patient Arman Filter LPN  November 30, 979 1:54 PM    Physical Exam  General:  obese female in nad  Nose:  no purulence or discharge Lungs:  faint basilar crackles, no wheezing or rhonchi  Heart:  rrr, no mrg  Extremities:  edema noted, no cyanosis  Neurologic:  alert and oriented, moves all 4    Impression & Recommendations:  Problem # 1:  OBSTRUCTIVE SLEEP APNEA (ICD-327.23) the pt has known osa that I suspect has gotten much worse given her 60 pound weight gain since the last study.  Will need to restart on bipap, since I suspect this contributes to her dyspnea thru its impact on her pulmonary pressures and diastolic dysfxn.    Problem # 2:  CHRONIC RESPIRATORY FAILURE (ICD-518.83) Multifactorial.  Her recent echo had a poor acoustic window, but I suspect she has signficant pulmonary htn for many reasons that is resulting in her current worsening symptoms.  I would like to diurese her more aggressively, and also treat her osa.  If she does not improve, would recommend RHC.    Problem # 3:  COPD (ICD-496) the pt has no bronchospasm or significant ongoing airway symptoms on her current bronchodilator regimen.    Problem # 4:  ACUTE ON CHRONIC DIASTOLIC HEART FAILURE (ICD-428.33) she has lost 8 pounds of fluid since her diuretic was increased last visit... I have asked her to continue with this, and  to watch her sodium intake.    Problem # 5:  PULMONARY SARCOIDOSIS (ICD-135) I believe this is a nonfactor currently, although it can contribute to pulm htn.  Will keep this in mind.  Medications Added to Medication List This Visit: 1)  Albuterol Sulfate (2.5 Mg/35ml) 0.083% Nebu (Albuterol sulfate) .Marland Kitchen.. 1 vial in nebulizer every 4 to 6 hours as needed 2)  Furosemide 80 Mg Tabs (Furosemide) .... Take 1 1/2 tab by mouth each morning and 1 tab by mouth each afternoon  Other Orders: Est. Patient Level IV (19147) DME Referral (DME) TLB-BMP (Basic Metabolic Panel-BMET) (80048-METABOL) TLB-BNP (B-Natriuretic Peptide) (83880-BNPR)  Patient Instructions: 1)  continue on your current fluid medication dose. 2)  will check bloodwork today to check potassium 3)  no change in breathing meds 4)  will start back on bipap for your sleep apnea. 5)  work on [employer]  reduction. 6)  followup with me in 5weeks.

## 2010-12-13 NOTE — Progress Notes (Signed)
Summary: questions regarding BiPAP and 02  Phone Note Call from Patient Call back at [facility] Phone [phone]   Caller: Patient Call For: clance Summary of Call: patient phoned stated that Dr Shelle Iron was going to order her a BiPAP but she has not heard anything about the order. Stated that Dr Shelle Iron also told her to get rid of the 02 that she has because it is to big for her to carry around. She can be reached at [phone] Initial call taken by: Vedia Coffer,  December 07, 2010 11:30 AM  Follow-up for Phone Call        Pt called and stated no one has contacted her about her about setting up her bpap yet. Called and spoke wit kathryn at [facility] and she states they are scheduled to go to Mrs. Ishman's home today. She states she has spoke with the pt today and informed her of that. Also Samara Deist states they are also working with pt to find her a smaller o2 tank as well and she states pt is aware of this. I called and spoke wit pt and informed her what [facility] informed me and she verbalized understanding.   [facility], [facility] states they never received pt sleep study and wants to know if you can refax it over [facility]  December 07, 2010 12:21 PM     Additional Follow-up for Phone Call Additional follow up Details #1::        spoke to kim @[facility]  pt is now wanting to get 02 with conserving device because she has decided to do her school at home and she is being seen today  Additional Follow-up by: Oneita Jolly,  December 07, 2010 2:30 PM     Appended Document: questions regarding BiPAP and 02 kc i need an order for pt to have conserving device and small 02 tanks per pt's request and she will dc eclipse she has now decided to go to school from home   Appended Document: questions regarding BiPAP and 02    Clinical Lists Changes  Orders: Added new Referral order of DME Referral (DME) - Signed

## 2010-12-13 NOTE — Assessment & Plan Note (Signed)
Summary: 3 MO FOLLOW UP//[facility]   Vital Signs:  Patient profile:   45 year old female Weight:      301.4 pounds (137.00 kg) O2 Sat:      94 % on Room air Temp:     98.3 degrees F (36.83 degrees C) oral Pulse rate:   89 / minute BP sitting:   120 / 78  (left arm) Cuff size:   large  Vitals Entered By: Orlan Leavens RMA (December 08, 2010 10:31 AM)  O2 Flow:  Room air CC: 3 month follow-up Is Patient Diabetic? No Pain Assessment Patient in pain? no      Comments Req rx for Mupirocin Ointment 2% to use for her nose   Primary Care Provider:  Newt Lukes MD  CC:  3 month follow-up.  History of Present Illness: here for f/u  1) chronic hypoxic resp failure - home O2 dep since 2010 - overlap with OSA/OHS and obst dz per pulm - reports compliance with ongoing medical treatment and home O2 and no changes in medication dose or frequency. denies adverse side effects related to current therapy.  chronic dyspnea and congestion sensation - Quit smoking 08/2010!!  2) HTN - reports compliance with ongoing medical treatment and no changes in medication dose or frequency. denies adverse side effects related to current therapy and ?rash from dilt  resolved with change back to amlodipine  3) dCHF, chronic - echo 12/2009 reviewed - reports compliance with ongoing medical treatment and no changes in medication dose or frequency. denies adverse side effects related to current therapy. improved edema and dyspnea with inc lasix 09/2010 - now ready to resume prior dosing  4) morbid obesity - weight gain reviewed - ??all fluid  5) anxiety - chronic hx same with daily BZ use - changed from klonopin to xanax 07/2010 due to "too sleepy"   6) GERD - reports compliance with ongoing medical treatment and no changes in medication dose or frequency. denies adverse side effects related to current therapy.   6) dyslipidemia -started on simva spring 2011- reports intermittent compliance with ongoing medical  treatment and no changes in medication dose or frequency. denies adverse side effects related to current therapy.   7) fibromyalgia - chronic pain - on daily narcotics for years - would like to reduce dose if possible (reports on "10mg " while in [place] - now on "5" - but note prev hydrocod, now oxy) - pain affects chest, back, legs and "whole body" - new muscle spasm in back last 3 nights - ?resume flexeril  Clinical Review Panels:  Complete Metabolic Panel   Glucose:  96 (11/30/2010)   Sodium:  139 (11/30/2010)   Potassium:  3.7 (11/30/2010)   Chloride:  102 (11/30/2010)   CO2:  28 (11/30/2010)   BUN:  13 (11/30/2010)   Creatinine:  1.0 (11/30/2010)   Albumin:  3.4 (03/06/2010)   Total Protein:  7.3 (03/06/2010)   Calcium:  9.1 (11/30/2010)   Total Bili:  0.6 (03/06/2010)   Alk Phos:  135 (03/06/2010)   SGPT (ALT):  28 (03/06/2010)   SGOT (AST):  29 (03/06/2010)   Current Medications (verified): 1)  Albuterol Sulfate (2.5 Mg/23ml) 0.083% Nebu (Albuterol Sulfate) .Marland Kitchen.. 1 Vial in Nebulizer Every 4 To 6 Hours As Needed 2)  Spiriva Handihaler 18 Mcg Caps (Tiotropium Bromide Monohydrate) .... Once Daily 3)  Symbicort 160-4.5 Mcg/act  Aero (Budesonide-Formoterol Fumarate) .... Two Puffs Twice Daily 4)  Ventolin Hfa 108 (90 Base) Mcg/act Aers (Albuterol  Sulfate) .... As Needed 5)  Klor-Con M20 20 Meq Cr-Tabs (Potassium Chloride Crys Cr) .Marland Kitchen.. 1 By Mouth Three Times A Day 6)  Oxycodone-Acetaminophen 5-325 Mg Tabs (Oxycodone-Acetaminophen) .... Take 1 Tablet By Mouth Two Times A Day 7)  Omeprazole 40 Mg Cpdr (Omeprazole) .... Take 1 Capsule By Mouth Once A Day 8)  Furosemide 80 Mg Tabs (Furosemide) .... Take 1 1/2 Tab By Mouth Each Morning and 1 Tab By Mouth Each Afternoon 9)  Norvasc 5 Mg Tabs (Amlodipine Besylate) .Marland Kitchen.. 1 By Mouth Once Daily 10)  Xanax 0.5 Mg Tabs (Alprazolam) .Marland Kitchen.. 1 Tablet Two Times A Day 11)  Flonase 50 Mcg/act Susp (Fluticasone Propionate) .... 2 Sprays Each Nostril  Every  Morning 12)  Oxygen .... W/ Exertion and At Night  Allergies (verified): 1)  ! Bactrim 2)  ! Doxycycline 3)  ! Zithromax 4)  Cipro 5)  * Latex  Past History:  Past Medical History: HEADACHE, CHRONIC  fibromyalgia - daily narcotics  SLEEP APNEA, noncompliant w/ CPAP    HYPERLIPIDEMIA  ANXIETY - hx chronic BZ use stopped 07/2010 ANEMIA PULMONARY SARCOIDOSIS--unimpressive ct chest 2011 COPD --moderate airflow obstruction, suspect is due to emphysema HYPERTENSION Asthma Allergic rhinitis Colonic polyps, hx GERD   MD roster: pulm - clance  Review of Systems  The patient denies weight gain, chest pain, syncope, and headaches.    Physical Exam  General:  morbidly obese female in nad Lungs:  poor depth of inspiration, no inc WOB, bilaterally clear, no rhonchi or crackles Heart:  distant heart sounds but normal rate, regular rhythm, 2/6 syst murmur, and no rub. BLE with trace edema. Psych:  Oriented X3, memory intact for recent and remote, normally interactive, good eye contact, not anxious appearing, not depressed appearing, and not agitated.      Impression & Recommendations:  Problem # 1:  ACUTE ON CHRONIC DIASTOLIC HEART FAILURE (ICD-428.33) Assessment Improved  Her updated medication list for this problem includes:    Furosemide 80 Mg Tabs (Furosemide) .Marland Kitchen... Take 1 by mouth each morning and 1 tab by mouth each afternoon (or as directed) resolved - will slowly reduce dose diuretic as tol - weight reduction reviewed  Problem # 2:  FIBROMYALGIA (ICD-729.1)  Her updated medication list for this problem includes:    Oxycodone-acetaminophen 5-325 Mg Tabs (Oxycodone-acetaminophen) .Marland Kitchen... Take 1 tablet by mouth two times a day    Flexeril 10 Mg Tabs (Cyclobenzaprine hcl) .Marland Kitchen... 1 by mouth at bedtime for muscle spasm as needed  chronic narc use reviewed  - add muscle relaxer as needed - erx flexeril done  Orders: Prescription Created Electronically [phone])  Complete  Medication List: 1)  Albuterol Sulfate (2.5 Mg/65ml) 0.083% Nebu (Albuterol sulfate) .Marland Kitchen.. 1 vial in nebulizer every 4 to 6 hours as needed 2)  Spiriva Handihaler 18 Mcg Caps (Tiotropium bromide monohydrate) .... Once daily 3)  Symbicort 160-4.5 Mcg/act Aero (Budesonide-formoterol fumarate) .... Two puffs twice daily 4)  Ventolin Hfa 108 (90 Base) Mcg/act Aers (Albuterol sulfate) .... As needed 5)  Klor-con M20 20 Meq Cr-tabs (Potassium chloride crys cr) .Marland Kitchen.. 1 by mouth two times a day 6)  Oxycodone-acetaminophen 5-325 Mg Tabs (Oxycodone-acetaminophen) .... Take 1 tablet by mouth two times a day 7)  Omeprazole 40 Mg Cpdr (Omeprazole) .... Take 1 capsule by mouth once a day 8)  Furosemide 80 Mg Tabs (Furosemide) .... Take 1 by mouth each morning and 1 tab by mouth each afternoon (or as directed) 9)  Norvasc 5 Mg Tabs (Amlodipine  besylate) .Marland Kitchen.. 1 by mouth once daily 10)  Xanax 0.5 Mg Tabs (Alprazolam) .Marland Kitchen.. 1 tablet two times a day 11)  Flonase 50 Mcg/act Susp (Fluticasone propionate) .... 2 sprays each nostril  every morning 12)  Oxygen  .... W/ exertion and at night 13)  Mupirocin 2 % Oint (Mupirocin) .... Apply as needed 14)  Flexeril 10 Mg Tabs (Cyclobenzaprine hcl) .Marland Kitchen.. 1 by mouth at bedtime for muscle spasm as needed  Patient Instructions: 1)  it was good to see you today. 2)  reduce furosemide dose as discussed - also reduce potassium to correspond with furosemide use 3)  use flexeril as needed, refill on ointment 4)  your prescriptions have been electronically submitted to your pharmacy. Please take as directed. Contact our office if you believe you're having problems with the medication(s).  5)  Please schedule a follow-up appointment in 2 months to review fluid and weight and medications, call sooner if problems.  Prescriptions: FLEXERIL 10 MG TABS (CYCLOBENZAPRINE HCL) 1 by mouth at bedtime for muscle spasm as needed  #30 x 1   Entered and Authorized by:   Newt Lukes MD    Signed by:   Newt Lukes MD on 12/08/2010   Method used:   [facility] [address] [phone]* (retail)       [address]       [place], [place]  [address]       Ph: [phone] or [phone]       Fax: [phone]   RxID:   [id number] MUPIROCIN 2 % OINT (MUPIROCIN) apply as needed  #1 x 1   Entered and Authorized by:   Newt Lukes MD   Signed by:   Newt Lukes MD on 12/08/2010   Method used:   [facility]        [facility] [address] [phone]* (retail)       [address]       [place], [place]  [address]       Ph: [phone] or [phone]       Fax: [phone]   RxID:   [id number]    Orders Added: 1)  Est. Patient Level IV [62831] 2)  Prescription Created Electronically [phone]

## 2010-12-14 LAB — BASIC METABOLIC PANEL
CO2: 28 mEq/L (ref 19–32)
Calcium: 8.7 mg/dL (ref 8.4–10.5)
Chloride: 106 mEq/L (ref 96–112)
Glucose, Bld: 98 mg/dL (ref 70–99)
Sodium: 138 mEq/L (ref 135–145)

## 2010-12-14 LAB — CBC
HCT: 30.3 % — ABNORMAL LOW (ref 36.0–46.0)
Hemoglobin: 9.5 g/dL — ABNORMAL LOW (ref 12.0–15.0)
MCHC: 31.5 g/dL (ref 30.0–36.0)
MCV: 66.5 fL — ABNORMAL LOW (ref 78.0–100.0)
RDW: 27.3 % — ABNORMAL HIGH (ref 11.5–15.5)

## 2010-12-14 LAB — DIFFERENTIAL
Basophils Relative: 3 % — ABNORMAL HIGH (ref 0–1)
Eosinophils Absolute: 0.2 10*3/uL (ref 0.0–0.7)
Eosinophils Relative: 3 % (ref 0–5)
Lymphs Abs: 1.5 10*3/uL (ref 0.7–4.0)
Monocytes Absolute: 0.6 10*3/uL (ref 0.1–1.0)
Neutro Abs: 5.6 10*3/uL (ref 1.7–7.7)
Neutrophils Relative %: 67 % (ref 43–77)

## 2010-12-15 ENCOUNTER — Telehealth: Payer: Self-pay | Admitting: Pulmonary Disease

## 2010-12-15 NOTE — Telephone Encounter (Signed)
Changed pts 02 and bipap to [facility] pt is aware and so in aps [facility]

## 2010-12-16 ENCOUNTER — Telehealth: Payer: Self-pay | Admitting: Pulmonary Disease

## 2010-12-16 DIAGNOSIS — J449 Chronic obstructive pulmonary disease, unspecified: Secondary | ICD-10-CM

## 2010-12-16 NOTE — Telephone Encounter (Signed)
Order was sent to [facility] for nebulizer machine to be sent to [facility].

## 2010-12-16 NOTE — Telephone Encounter (Signed)
Order given to [facility] to get pt a nebulizer machine

## 2010-12-19 ENCOUNTER — Encounter: Payer: Self-pay | Admitting: Pulmonary Disease

## 2010-12-20 ENCOUNTER — Encounter: Payer: Self-pay | Admitting: Pulmonary Disease

## 2010-12-22 ENCOUNTER — Telehealth: Payer: Self-pay | Admitting: Internal Medicine

## 2010-12-22 NOTE — Telephone Encounter (Signed)
PA for Albuterol 0.083% inhalation soln 25x76mL PA Form requested from [employer] @[phone] PA form received & forwarded to Dr Felicity Coyer for completion

## 2010-12-26 ENCOUNTER — Other Ambulatory Visit: Payer: Self-pay

## 2010-12-26 DIAGNOSIS — G8929 Other chronic pain: Secondary | ICD-10-CM

## 2010-12-26 MED ORDER — OXYCODONE-ACETAMINOPHEN 5-325 MG PO TABS: 1.0000 | ORAL_TABLET | Freq: Two times a day (BID) | ORAL | Status: DC | PRN

## 2010-12-26 NOTE — Telephone Encounter (Signed)
Pt called requesting refill of pain medication

## 2010-12-26 NOTE — Telephone Encounter (Signed)
Ok to refill - prescription printed and signed - placed on triage desk  

## 2010-12-26 NOTE — Telephone Encounter (Signed)
Completed PA form faxed for Approval. 

## 2010-12-27 ENCOUNTER — Telehealth: Payer: Self-pay

## 2010-12-27 NOTE — Telephone Encounter (Signed)
A user error has taken place: encounter opened in error, closed for administrative reasons.

## 2010-12-27 NOTE — Telephone Encounter (Signed)
Pt informed, Rx in cabinet for pt pick up  

## 2010-12-28 NOTE — Telephone Encounter (Signed)
[place] Approval denied - Rx drug prescribed not covered under Medicare Part D; is available under Part B of Medicare. Faxed this information to pharmacy:[facility] @ [phone]

## 2011-01-03 ENCOUNTER — Encounter: Payer: Self-pay | Admitting: Pulmonary Disease

## 2011-01-04 ENCOUNTER — Telehealth: Payer: Self-pay | Admitting: Pulmonary Disease

## 2011-01-04 ENCOUNTER — Ambulatory Visit: Payer: Medicare Other | Admitting: Pulmonary Disease

## 2011-01-04 NOTE — Telephone Encounter (Signed)
ATC x 1 NA WCB 

## 2011-01-05 NOTE — Telephone Encounter (Signed)
Spoke w/ [name] and she states [facility] will not set her up on her bpap until she has a titration study done. [name] states [facility] informed her once this study has been done and they received those results then they can set her up. Please advise Dr. Shelle Iron. Thanks

## 2011-01-06 NOTE — Telephone Encounter (Signed)
This needs to go thru [facility].  I would like them to call advanced and verify what the issue is.  If this is TRULY what the insurance is saying, and not just what advanced would like, then can do bipap titration study.

## 2011-01-09 NOTE — Telephone Encounter (Signed)
bipap titration study @[facility] 01/11/11@8 :00pm pt aware

## 2011-01-11 ENCOUNTER — Encounter (HOSPITAL_BASED_OUTPATIENT_CLINIC_OR_DEPARTMENT_OTHER): Payer: Medicare Other

## 2011-01-14 ENCOUNTER — Emergency Department (HOSPITAL_COMMUNITY): Payer: Medicare Other

## 2011-01-14 ENCOUNTER — Emergency Department (HOSPITAL_COMMUNITY)
Admission: EM | Admit: 2011-01-14 | Discharge: 2011-01-15 | Disposition: A | Discharge status: Home / Self Care | Payer: Medicare Other | Attending: Emergency Medicine | Admitting: Emergency Medicine

## 2011-01-14 DIAGNOSIS — R059 Cough, unspecified: Secondary | ICD-10-CM | POA: Insufficient documentation

## 2011-01-14 DIAGNOSIS — R062 Wheezing: Secondary | ICD-10-CM | POA: Insufficient documentation

## 2011-01-14 DIAGNOSIS — D869 Sarcoidosis, unspecified: Secondary | ICD-10-CM | POA: Insufficient documentation

## 2011-01-14 DIAGNOSIS — R05 Cough: Secondary | ICD-10-CM | POA: Insufficient documentation

## 2011-01-14 DIAGNOSIS — Z79899 Other long term (current) drug therapy: Secondary | ICD-10-CM | POA: Insufficient documentation

## 2011-01-14 DIAGNOSIS — I10 Essential (primary) hypertension: Secondary | ICD-10-CM | POA: Insufficient documentation

## 2011-01-14 DIAGNOSIS — J438 Other emphysema: Secondary | ICD-10-CM | POA: Insufficient documentation

## 2011-01-14 DIAGNOSIS — R609 Edema, unspecified: Secondary | ICD-10-CM | POA: Insufficient documentation

## 2011-01-14 DIAGNOSIS — R0609 Other forms of dyspnea: Secondary | ICD-10-CM | POA: Insufficient documentation

## 2011-01-14 DIAGNOSIS — R079 Chest pain, unspecified: Secondary | ICD-10-CM | POA: Insufficient documentation

## 2011-01-14 DIAGNOSIS — R0602 Shortness of breath: Secondary | ICD-10-CM | POA: Insufficient documentation

## 2011-01-14 DIAGNOSIS — R0989 Other specified symptoms and signs involving the circulatory and respiratory systems: Secondary | ICD-10-CM | POA: Insufficient documentation

## 2011-01-14 DIAGNOSIS — R0682 Tachypnea, not elsewhere classified: Secondary | ICD-10-CM | POA: Insufficient documentation

## 2011-01-14 LAB — DIFFERENTIAL
Basophils Relative: 1 % (ref 0–1)
Eosinophils Absolute: 0.4 10*3/uL (ref 0.0–0.7)
Lymphs Abs: 1.6 10*3/uL (ref 0.7–4.0)
Monocytes Absolute: 0.5 10*3/uL (ref 0.1–1.0)
Monocytes Relative: 8 % (ref 3–12)

## 2011-01-14 LAB — CBC
Hemoglobin: 13.5 g/dL (ref 12.0–15.0)
MCH: 24.6 pg — ABNORMAL LOW (ref 26.0–34.0)
MCHC: 32.7 g/dL (ref 30.0–36.0)
MCV: 75.2 fL — ABNORMAL LOW (ref 78.0–100.0)
Platelets: 195 10*3/uL (ref 150–400)
RBC: 5.49 MIL/uL — ABNORMAL HIGH (ref 3.87–5.11)

## 2011-01-14 LAB — BRAIN NATRIURETIC PEPTIDE: Pro B Natriuretic peptide (BNP): <30 pg/mL (ref 0.0–100.0)

## 2011-01-14 LAB — POCT I-STAT, CHEM 8
BUN: 12 mg/dL (ref 6–23)
Calcium, Ion: 1.03 mmol/L — ABNORMAL LOW (ref 1.12–1.32)
Chloride: 103 mEq/L (ref 96–112)
Creatinine, Ser: 1.2 mg/dL (ref 0.4–1.2)
TCO2: 25 mmol/L (ref 0–100)

## 2011-01-14 LAB — POCT CARDIAC MARKERS: Troponin i, poc: <0.05 ng/mL (ref 0.00–0.09)

## 2011-01-17 ENCOUNTER — Telehealth: Payer: Self-pay | Admitting: Pulmonary Disease

## 2011-01-17 NOTE — Telephone Encounter (Signed)
Per [facility] pt is on everything she can possibly be on. If pt gets worse from now until tomorrow's OV then she needs to go to ED to be evaluated.   Advised pt of this and she verbalized understanding. Nothing further was needed

## 2011-01-17 NOTE — Telephone Encounter (Signed)
Spoke w/ pt and she is scheduled to see [facility] 01/18/11 at 2:45 for SOB. Pt states she went to urgent care on Saturday and they did cxr and compared it to the one she had in December and advised her she had PNA in both lungs. Pt was given zpak and cefdinir and still is currently taking this, prednisone taking 60 mg a day, cough syrup, and was given shot of rocephin. Pt states she then went to ER and they advised her that her cxr has unchanged. Pt states she feels like her airways are closing up and she can only walk very little distance and she is extremely SOB. Pt states she currently wears her O2 at 3 liters continuous and sometimes at 4 liters but still feels extremely SOB. I advised pt to bring in her cxr from urgent care to her apt and she states she will. Pt is wanting recs until her apt tomorrow w/ [facility]. I advise pt she still needs to continue her medications as directed and stay on her 02 at all times. Pt states she is but still would like recs from [facility]. Please advise Dr. Shelle Iron. Thanks  Allergies  Allergen Reactions  . Azithromycin     REACTION: resistant  . Ciprofloxacin     REACTION: nausea  . Doxycycline     REACTION: restistant  . Latex     REACTION: rash, burning  . Sulfamethoxazole W/Trimethoprim     REACTION: Projectile Vomiting    Carver Fila, CMA

## 2011-01-18 ENCOUNTER — Ambulatory Visit (INDEPENDENT_AMBULATORY_CARE_PROVIDER_SITE_OTHER): Payer: Medicare Other | Admitting: Pulmonary Disease

## 2011-01-18 ENCOUNTER — Encounter: Payer: Self-pay | Admitting: Pulmonary Disease

## 2011-01-18 DIAGNOSIS — J4489 Other specified chronic obstructive pulmonary disease: Secondary | ICD-10-CM

## 2011-01-18 DIAGNOSIS — I5033 Acute on chronic diastolic (congestive) heart failure: Secondary | ICD-10-CM

## 2011-01-18 DIAGNOSIS — J449 Chronic obstructive pulmonary disease, unspecified: Secondary | ICD-10-CM

## 2011-01-18 MED ORDER — ALBUTEROL SULFATE (2.5 MG/3ML) 0.083% IN NEBU: 2.5000 mg | INHALATION_SOLUTION | Freq: Four times a day (QID) | RESPIRATORY_TRACT | Status: DC | PRN

## 2011-01-18 NOTE — Patient Instructions (Addendum)
Today, take only 2 prednisone tabs.  Then take one thurs and Friday am then stop Please keep your appt with Dr. Felicity Coyer to discuss your fluid meds Keep followup with me, but call if you are not getting back to your usual self.

## 2011-01-18 NOTE — Assessment & Plan Note (Signed)
The pt's cough and mucus is suggestive of some type of pulmonary infection, but her history and cxr are more suggestive of decompensated heart failure.  She is better with increasing the lasix on her own.  She may have had acute bronchitis, but the cxr is not c/w pna.  She is better, and has apptm with Dr. Felicity Coyer in 48hrs.  I will get her off this high dose of prednisone, and she can discuss with primary her diuretic regimen.

## 2011-01-18 NOTE — Progress Notes (Signed)
  Subjective:    [name], female    DOB: [birth year], 45 y.o.   MRN: [id number]  HPI The pt comes in today for an acute sick visit.  She has known chronic respiratory failure that is due to OHS/OSA, emphysema, and diastolic dysfunction.  She recent had issues with cough, discolored mucus, and increased sob.  Went to Urgent care and felt to have pna by cxr.  Rx with abx and pred pulse.  I have reviewed cxr today and she clearly has acute interstitial edema on cxr, and nothing to suggest focal pna.  The pt tells me before this her lasix had been decreased, and she increased on her own the beginning of the week because of worsening edema and that she was not breathing any better.  Since doing so, she feels her breathing is greatly improved. She feels better, and is no longer coughing up mucus of significance.    Review of Systems  Constitutional: Positive for unexpected weight change. Negative for fever.  HENT: Positive for nosebleeds, congestion and dental problem. Negative for ear pain, sore throat, rhinorrhea, sneezing, trouble swallowing, postnasal drip and sinus pressure.   Eyes: Negative for redness and itching.  Respiratory: Positive for cough, shortness of breath and wheezing. Negative for chest tightness.   Cardiovascular: Positive for palpitations and leg swelling.  Gastrointestinal: Positive for nausea. Negative for vomiting.  Genitourinary: Negative for dysuria.  Musculoskeletal: Positive for joint swelling.  Skin: Negative for rash.  Neurological: Positive for headaches.  Hematological: Does not bruise/bleed easily.  Psychiatric/Behavioral: Negative for dysphoric mood.       Objective:   Physical Exam Obese female in nad No purulence or discharge from nares Chest with basilar crackles, no wheezing or rhonchi Cor with rrr, 2/6 sem LE with 1+ edema, no cyanosis Alert and oriented, moves all 4       Assessment & Plan:

## 2011-01-20 ENCOUNTER — Ambulatory Visit (INDEPENDENT_AMBULATORY_CARE_PROVIDER_SITE_OTHER): Payer: Medicare Other | Admitting: Internal Medicine

## 2011-01-20 ENCOUNTER — Encounter: Payer: Self-pay | Admitting: Internal Medicine

## 2011-01-20 DIAGNOSIS — I10 Essential (primary) hypertension: Secondary | ICD-10-CM

## 2011-01-20 DIAGNOSIS — I5033 Acute on chronic diastolic (congestive) heart failure: Secondary | ICD-10-CM

## 2011-01-20 NOTE — Assessment & Plan Note (Signed)
Pt reports interest in lap band - optimize cardiopulm status prior to eval - encouraged to work on diet and exertion efforts as tolerated until then Wt Readings from Last 3 Encounters:  01/20/11 304 lb (137.893 kg)  01/18/11 309 lb 9.6 oz (140.434 kg)  12/08/10 301 lb 6.4 oz (136.714 kg)

## 2011-01-20 NOTE — Patient Instructions (Signed)
It was good to see you today. Medications reviewed, no changes at this time. Remain on Lasix 80 2x/day as ongoing, no reduction We have reviewed your prior records including labs and tests today Please schedule followup in 3-4 months, call sooner if problems.

## 2011-01-20 NOTE — Assessment & Plan Note (Signed)
As per pulm 4/25, agree with continued furosemide bid and reduced steroid dose as tolerated CXR reviewed from 4/21 eval in ED, no evidence for infx causing exac - Reports upcoming sleep study - ?need BiPAP - defer to [facility]

## 2011-01-20 NOTE — Progress Notes (Signed)
Subjective:    [name], female    DOB: [birth year], 45 y.o.   MRN: [id number]  HPI  Here for follow up - reviewed chronic medical issues:  chronic hypoxic resp failure - home O2 dep since 2010 - overlap with OSA/OHS and obst dz per pulm - reports compliance with ongoing medical treatment and home O2 and no changes in medication dose or frequency. denies adverse side effects related to current therapy.  chronic dyspnea and congestion sensation - Quit smoking 08/2010!!  HTN - reports compliance with ongoing medical treatment and no changes in medication dose or frequency. denies adverse side effects related to current therapy and rash from dilt  resolved with change back to amlodipine  dCHF, chronic - echo 12/2009 reviewed - reports compliance with ongoing medical treatment and no changes in medication dose or frequency. denies adverse side effects related to current therapy. improved edema and dyspnea with inc lasix 09/2010 -fluid re accumulate when on qd dose - back to bid  morbid obesity - weight gain reviewed -  anxiety - chronic hx same with daily BZ use - changed from klonopin to xanax 07/2010 due to "too sleepy"   GERD - reports compliance with ongoing medical treatment and no changes in medication dose or frequency. denies adverse side effects related to current therapy.   dyslipidemia -started on simva spring 2011- reports intermittent compliance with ongoing medical treatment and no changes in medication dose or frequency. denies adverse side effects related to current therapy.   fibromyalgia - chronic pain - on daily narcotics for years - would like to reduce dose if possible (reports on "10mg " while in [place] - now on "5" - but note prev hydrocod, now oxy) - pain affects chest, back, legs and "whole body" - new muscle spasm in back last 3 nights - ?resume flexeril  Past Medical History  Diagnosis Date  . Sleep apnea     noncompliant w/ CPAP  . COPD (chronic  obstructive pulmonary disease)     moderate airflow obstruction, suspect d/t emphysema  . Hypertension   . Asthma   . ALLERGIC RHINITIS   . Hyperlipidemia   . Chronic headache   . Fibromyalgia     daily narcotics  . Anxiety     hx chronic BZ use, stopped 07/2010  . Anemia   . Pulmonary sarcoidosis     unimpressive CT chest 2011  . Colonic polyp   . GERD (gastroesophageal reflux disease)     Review of Systems  Constitutional: Negative for fever.  Respiratory: Negative for cough.   Neurological: Negative for headaches.       Objective:   Physical Exam  Constitutional: She is oriented to person, place, and time. She appears well-developed and well-nourished.       Morbidly obese.  Cardiovascular: Normal rate, regular rhythm and normal heart sounds.   Pulmonary/Chest: She is in respiratory distress. She has no wheezes.  Neurological: She is alert and oriented to person, place, and time.  Psychiatric: She has a normal mood and affect. Her behavior is normal.   BP 126/64  Pulse 78  Temp(Src) 98.4 F (36.9 C) (Oral)  Ht 5' 8.5" (1.74 m)  Wt 304 lb (137.893 kg)  BMI 45.55 kg/m2  SpO2 97% Wt Readings from Last 3 Encounters:  01/20/11 304 lb (137.893 kg)  01/18/11 309 lb 9.6 oz (140.434 kg)  12/08/10 301 lb 6.4 oz (136.714 kg)   Lab Results  Component Value Date   CREATININE  1.2 01/14/2011        Assessment & Plan:  See problem list. Medications and labs reviewed today. Time spent with pt 25 minutes, greater than 50% time spent counseling patient on dCHF, recent ER and pulm eval and medication review. Also review of prior records

## 2011-01-20 NOTE — Assessment & Plan Note (Signed)
The current medical regimen is effective;  continue present plan and medications. BP Readings from Last 3 Encounters:  01/20/11 126/64  01/18/11 112/64  12/08/10 120/78

## 2011-01-25 ENCOUNTER — Other Ambulatory Visit (INDEPENDENT_AMBULATORY_CARE_PROVIDER_SITE_OTHER): Payer: Medicare Other | Admitting: Internal Medicine

## 2011-01-25 DIAGNOSIS — G8929 Other chronic pain: Secondary | ICD-10-CM

## 2011-01-25 MED ORDER — OXYCODONE-ACETAMINOPHEN 5-325 MG PO TABS: 1.0000 | ORAL_TABLET | Freq: Two times a day (BID) | ORAL | Status: DC | PRN

## 2011-01-26 ENCOUNTER — Telehealth: Payer: Self-pay

## 2011-01-26 NOTE — Telephone Encounter (Signed)
No answer, no VM, Mobile number is disconnected.

## 2011-01-26 NOTE — Telephone Encounter (Deleted)
Pt called stating he wants to try Lexapro as discussed at OV yesterday.

## 2011-01-26 NOTE — Telephone Encounter (Signed)
A user error has taken place: encounter opened in error, closed for administrative reasons.

## 2011-01-26 NOTE — Telephone Encounter (Signed)
Rx in cabinet for pt pick up, no answer on home phone, no VM

## 2011-01-31 ENCOUNTER — Ambulatory Visit (HOSPITAL_BASED_OUTPATIENT_CLINIC_OR_DEPARTMENT_OTHER): Payer: Medicare Other | Attending: Pulmonary Disease

## 2011-01-31 DIAGNOSIS — G4733 Obstructive sleep apnea (adult) (pediatric): Secondary | ICD-10-CM | POA: Insufficient documentation

## 2011-02-01 ENCOUNTER — Ambulatory Visit: Payer: Self-pay | Admitting: Pulmonary Disease

## 2011-02-01 ENCOUNTER — Inpatient Hospital Stay (INDEPENDENT_AMBULATORY_CARE_PROVIDER_SITE_OTHER)
Admission: RE | Admit: 2011-02-01 | Discharge: 2011-02-01 | Disposition: A | Discharge status: Home / Self Care | Payer: Medicare Other | Source: Ambulatory Visit | Attending: Family Medicine | Admitting: Family Medicine

## 2011-02-01 ENCOUNTER — Ambulatory Visit (INDEPENDENT_AMBULATORY_CARE_PROVIDER_SITE_OTHER): Payer: Medicare Other

## 2011-02-01 DIAGNOSIS — J449 Chronic obstructive pulmonary disease, unspecified: Secondary | ICD-10-CM

## 2011-02-01 LAB — PRO B NATRIURETIC PEPTIDE: Pro B Natriuretic peptide (BNP): 83.2 pg/mL (ref 0–125)

## 2011-02-02 ENCOUNTER — Ambulatory Visit: Payer: Medicare Other | Admitting: Internal Medicine

## 2011-02-02 ENCOUNTER — Ambulatory Visit (INDEPENDENT_AMBULATORY_CARE_PROVIDER_SITE_OTHER): Payer: Medicare Other | Admitting: Pulmonary Disease

## 2011-02-02 ENCOUNTER — Telehealth: Payer: Self-pay

## 2011-02-02 ENCOUNTER — Inpatient Hospital Stay (HOSPITAL_COMMUNITY): Payer: Medicare Other

## 2011-02-02 ENCOUNTER — Inpatient Hospital Stay (HOSPITAL_COMMUNITY)
Admission: AD | Admit: 2011-02-02 | Discharge: 2011-02-09 | DRG: 988 | Disposition: A | Discharge status: Home Health | Payer: Medicare Other | Source: Ambulatory Visit | Attending: Pulmonary Disease | Admitting: Pulmonary Disease

## 2011-02-02 ENCOUNTER — Encounter: Payer: Self-pay | Admitting: Pulmonary Disease

## 2011-02-02 DIAGNOSIS — J4489 Other specified chronic obstructive pulmonary disease: Secondary | ICD-10-CM

## 2011-02-02 DIAGNOSIS — I5033 Acute on chronic diastolic (congestive) heart failure: Secondary | ICD-10-CM

## 2011-02-02 DIAGNOSIS — Z6841 Body Mass Index (BMI) 40.0 and over, adult: Secondary | ICD-10-CM

## 2011-02-02 DIAGNOSIS — J438 Other emphysema: Secondary | ICD-10-CM

## 2011-02-02 DIAGNOSIS — G4733 Obstructive sleep apnea (adult) (pediatric): Secondary | ICD-10-CM

## 2011-02-02 DIAGNOSIS — J449 Chronic obstructive pulmonary disease, unspecified: Secondary | ICD-10-CM

## 2011-02-02 DIAGNOSIS — Z91199 Patient's noncompliance with other medical treatment and regimen due to unspecified reason: Secondary | ICD-10-CM

## 2011-02-02 DIAGNOSIS — I5031 Acute diastolic (congestive) heart failure: Secondary | ICD-10-CM

## 2011-02-02 DIAGNOSIS — R069 Unspecified abnormalities of breathing: Secondary | ICD-10-CM

## 2011-02-02 DIAGNOSIS — J9610 Chronic respiratory failure, unspecified whether with hypoxia or hypercapnia: Secondary | ICD-10-CM

## 2011-02-02 DIAGNOSIS — D869 Sarcoidosis, unspecified: Secondary | ICD-10-CM

## 2011-02-02 DIAGNOSIS — K0530 Chronic periodontitis, unspecified: Secondary | ICD-10-CM | POA: Diagnosis present

## 2011-02-02 DIAGNOSIS — J9620 Acute and chronic respiratory failure, unspecified whether with hypoxia or hypercapnia: Secondary | ICD-10-CM

## 2011-02-02 DIAGNOSIS — E662 Morbid (severe) obesity with alveolar hypoventilation: Secondary | ICD-10-CM

## 2011-02-02 DIAGNOSIS — I5030 Unspecified diastolic (congestive) heart failure: Secondary | ICD-10-CM | POA: Diagnosis present

## 2011-02-02 DIAGNOSIS — I517 Cardiomegaly: Secondary | ICD-10-CM

## 2011-02-02 DIAGNOSIS — K0401 Reversible pulpitis: Secondary | ICD-10-CM | POA: Diagnosis present

## 2011-02-02 DIAGNOSIS — K047 Periapical abscess without sinus: Secondary | ICD-10-CM

## 2011-02-02 DIAGNOSIS — Z9119 Patient's noncompliance with other medical treatment and regimen: Secondary | ICD-10-CM

## 2011-02-02 DIAGNOSIS — J811 Chronic pulmonary edema: Secondary | ICD-10-CM | POA: Diagnosis present

## 2011-02-02 LAB — DIFFERENTIAL
Basophils Absolute: 0 10*3/uL (ref 0.0–0.1)
Basophils Relative: 1 % (ref 0–1)
Eosinophils Absolute: 0.3 10*3/uL (ref 0.0–0.7)
Eosinophils Relative: 6 % — ABNORMAL HIGH (ref 0–5)
Monocytes Absolute: 0.5 10*3/uL (ref 0.1–1.0)

## 2011-02-02 LAB — COMPREHENSIVE METABOLIC PANEL
ALT: 38 U/L — ABNORMAL HIGH (ref 0–35)
Albumin: 3.3 g/dL — ABNORMAL LOW (ref 3.5–5.2)
Calcium: 8.4 mg/dL (ref 8.4–10.5)
GFR calc Af Amer: >60 mL/min (ref >60)
Glucose, Bld: 97 mg/dL (ref 70–99)
Potassium: 3.5 mEq/L (ref 3.5–5.1)
Sodium: 136 mEq/L (ref 135–145)
Total Protein: 7 g/dL (ref 6.0–8.3)

## 2011-02-02 LAB — URINALYSIS, ROUTINE W REFLEX MICROSCOPIC
Bilirubin Urine: NEGATIVE
Ketones, ur: NEGATIVE mg/dL
Nitrite: NEGATIVE
Protein, ur: NEGATIVE mg/dL
Urobilinogen, UA: 1 mg/dL (ref 0.0–1.0)

## 2011-02-02 LAB — POCT I-STAT, CHEM 8
BUN: 11 mg/dL (ref 6–23)
Calcium, Ion: 1.11 mmol/L — ABNORMAL LOW (ref 1.12–1.32)
Chloride: 100 mEq/L (ref 96–112)
HCT: 44 % (ref 36.0–46.0)
Sodium: 138 mEq/L (ref 135–145)
TCO2: 31 mmol/L (ref 0–100)

## 2011-02-02 LAB — CBC
HCT: 40 % (ref 36.0–46.0)
MCHC: 30.3 g/dL (ref 30.0–36.0)
Platelets: 130 10*3/uL — ABNORMAL LOW (ref 150–400)
RDW: 15.9 % — ABNORMAL HIGH (ref 11.5–15.5)
WBC: 5 10*3/uL (ref 4.0–10.5)

## 2011-02-02 LAB — BLOOD GAS, ARTERIAL
Acid-Base Excess: 5 mmol/L — ABNORMAL HIGH (ref 0.0–2.0)
Bicarbonate: 30.9 mEq/L — ABNORMAL HIGH (ref 20.0–24.0)
Drawn by: 23590
O2 Content: 3 L/min
pCO2 arterial: 53.5 mmHg — ABNORMAL HIGH (ref 35.0–45.0)
pO2, Arterial: 69.8 mmHg — ABNORMAL LOW (ref 80.0–100.0)

## 2011-02-02 LAB — TSH: TSH: 1.279 u[IU]/mL (ref 0.350–4.500)

## 2011-02-02 NOTE — Telephone Encounter (Signed)
[facility] Triage Call Report Triage Record Num: [id number] Operator: Edythe Lynn Patient Name: [name] Call Date & Time: 02/02/2011 6:42:01AM Patient Phone: [phone] PCP: Rene Paci Patient Gender: Female PCP Fax : [phone] Patient DOB: [birth year] Practice Name: [facility] Reason for Call: Pt says she has a lump on her rt jaw line that is painful and getting larger. No fever. Pt took motrin. Advised to call at 0800 for appt. Protocol(s) Used: Skin Lesions Recommended Outcome per Protocol: See Provider within 4 hours Reason for Outcome: Any skin lesion with signs and symptoms of worsening infection (quickly getting larger, becoming more painful, purulent drainage, new fever not improving with treatment) Care Advice: ~ 02/02/2011 6:46:54AM Page 1 of 1 [email or web address]

## 2011-02-02 NOTE — Assessment & Plan Note (Signed)
The pt has known airflow obstruction by pfts that I suspect is due to emphysema.  She tells me she has not smoked since DEC.  There is nothing to indicate an acute exacerbation by exam.

## 2011-02-02 NOTE — Patient Instructions (Signed)
Will admit to hospital for aggressive diuresis, abx, dental consult Check echo, LE venous dopplers Bronchodilators.

## 2011-02-02 NOTE — Assessment & Plan Note (Signed)
She has been noncompliant in the past, and are trying to get her back on therapy.  She has bipap titration study 2 days ago, and will followup

## 2011-02-02 NOTE — Telephone Encounter (Signed)
Note pt referred to ER by Clance - thanks

## 2011-02-02 NOTE — Assessment & Plan Note (Signed)
The pt has had a 12 pound weight gain despite increasing her lasix.  I suspect the majority of her worsening sob and hypoxemia is due to this.  Will need admission to hospital for aggressive IV diuretic.  Will also check echo.

## 2011-02-02 NOTE — Assessment & Plan Note (Signed)
She has a h/o sarcoid by bx in the 90's, but record is not available for this.  Her chest ct's have not shown any significant process suggestive of active disease.

## 2011-02-02 NOTE — Assessment & Plan Note (Signed)
The pt has swelling left side of face, and a swelling along gum line at left bottom molar in back.  Will need abx and dental consult.

## 2011-02-02 NOTE — Progress Notes (Signed)
Subjective:    [name], female    DOB: 18-Nov-1965, 45 y.o.   MRN: [id number]  HPI The pt comes in today for an acute sick visit.  She has chronic resp failure due to morbid obesity, chronic diastolic heart failure, emphysema, and probable pulmonary htn.  She also has osa, and has been noncompliant with rx.  She did have a bipap titration study 2 days ago, and results are not available currently.  She is c/o worsening dyspnea, has gained 12 pounds of fluid weight since her last visit, and has cough with discolored mucus.  She also has left jaw swelling, with probable abscessed tooth.   Her sats are very decreased with ambulation.   Past Medical History  Diagnosis Date  . Sleep apnea     noncompliant w/ CPAP  . COPD (chronic obstructive pulmonary disease)     moderate airflow obstruction, suspect d/t emphysema  . Hypertension   . Asthma   . ALLERGIC RHINITIS   . Hyperlipidemia   . Chronic headache   . Fibromyalgia     daily narcotics  . Anxiety     hx chronic BZ use, stopped 07/2010  . Anemia   . Pulmonary sarcoidosis     unimpressive CT chest 2011  . Colonic polyp   . GERD (gastroesophageal reflux disease)      Family History  Problem Relation Age of Onset  . Hypertension Mother   . Emphysema Father   . Hypertension Father   . Stomach cancer Father   . Allergies Brother   . Hypertension Brother   . Stomach cancer Brother   . Heart disease Mother   . Heart disease Father   . Heart disease Brother      History   Social History  . Marital Status: Single    Spouse Name: N/A    Number of Children: N/A  . Years of Education: N/A   Occupational History  . Not on file.   Social History Main Topics  . Smoking status: Former Smoker -- 1.0 packs/day for 29 years    Quit date: 09/14/2010  . Smokeless tobacco: Not on file  . Alcohol Use: Not on file  . Drug Use: No  . Sexually Active: Not on file   Other Topics Concern  . Not on file   Social  History Narrative   Pt is single. No children, and disaability     Allergies  Allergen Reactions  . Azithromycin     REACTION: resistant  . Ciprofloxacin     REACTION: nausea  . Doxycycline     REACTION: restistant  . Latex     REACTION: rash, burning  . Sulfamethoxazole W/Trimethoprim     Bactrim REACTION: Projectile Vomiting     Outpatient Prescriptions Prior to Visit  Medication Sig Dispense Refill  . albuterol (PROVENTIL) (2.5 MG/3ML) 0.083% nebulizer solution Take 3 mLs (2.5 mg total) by nebulization every 6 (six) hours as needed.  120 vial  3  . ALPRAZolam (XANAX) 0.5 MG tablet Take 0.5 mg by mouth as needed.        Marland Kitchen amLODipine (NORVASC) 5 MG tablet Take 5 mg by mouth daily.        . budesonide-formoterol (SYMBICORT) 160-4.5 MCG/ACT inhaler Inhale 2 puffs into the lungs 2 (two) times daily.        . cetirizine (ZYRTEC) 10 MG chewable tablet Chew 10 mg by mouth daily.        . fluticasone (FLONASE) 50  MCG/ACT nasal spray 2 sprays by Nasal route every morning.        . furosemide (LASIX) 80 MG tablet Take 80 mg by mouth 2 (two) times daily.        Marland Kitchen omeprazole (PRILOSEC) 40 MG capsule Take 40 mg by mouth daily.        Marland Kitchen oxyCODONE-acetaminophen (PERCOCET) 5-325 MG per tablet Take 1 tablet by mouth 2 (two) times daily as needed.  60 tablet  0  . potassium chloride SA (K-DUR,KLOR-CON) 20 MEQ tablet Take 20 mEq by mouth 3 (three) times daily.        Marland Kitchen SPIRIVA HANDIHALER 18 MCG inhalation capsule USE ONCE DAILY AS DIRECTED  30 capsule  0  . azithromycin (ZITHROMAX) 250 MG tablet Take 2 tablets by mouth on day 1, followed by 1 tablet by mouth daily for 4 days.        . cefdinir (OMNICEF) 300 MG capsule Take 300 mg by mouth 2 (two) times daily.        Marland Kitchen HYDROcodone-homatropine (HYCODAN) 5-1.5 MG/5ML syrup 1 tsp every 6 hours as needed       . predniSONE (DELTASONE) 20 MG tablet Take 60 mg by mouth daily. For 6 days.           Review of Systems  Constitutional: Positive for  diaphoresis and unexpected weight change. Negative for fever.  HENT: Positive for ear pain, nosebleeds, congestion, sore throat, trouble swallowing and dental problem. Negative for rhinorrhea, sneezing, postnasal drip and sinus pressure.   Eyes: Negative for redness and itching.  Respiratory: Positive for cough, chest tightness, shortness of breath and wheezing.   Cardiovascular: Positive for leg swelling. Negative for palpitations.  Gastrointestinal: Negative for nausea and vomiting.  Genitourinary: Negative for dysuria.  Musculoskeletal: Positive for joint swelling.  Skin: Negative for rash.  Neurological: Positive for dizziness and headaches.  Hematological: Bruises/bleeds easily.  Psychiatric/Behavioral: Negative for dysphoric mood. The patient is nervous/anxious.        Objective:   Physical Exam Constitutional:  Obese female, no acute distress, appears ill.  HENT:  Nares patent without discharge  Oropharynx without exudate, palate and uvula are elongated.  Swelling of gum line with tenderness at back left molar.  Jaw swollen on left  Eyes:  Perrla, eomi, no scleral icterus  Neck:  No JVD, no TMG  Cardiovascular:  Normal rate, regular rhythm, no rubs or gallops.  3/6 sem        Intact distal pulses  Pulmonary :  Decreased breath sounds, no stridor or respiratory distress   Bibasilar rales, no rhonchi, or wheezing  Abdominal:  Soft, nondistended, bowel sounds present.  No tenderness noted.   Musculoskeletal: 2+ lower extremity edema noted.  Lymph Nodes:  No cervical lymphadenopathy noted  Skin:  No cyanosis noted  Neurologic:  Alert, appropriate, moves all 4 extremities without obvious deficit.         Assessment & Plan:

## 2011-02-03 ENCOUNTER — Inpatient Hospital Stay (HOSPITAL_COMMUNITY): Payer: Medicare Other

## 2011-02-03 LAB — BASIC METABOLIC PANEL
BUN: 10 mg/dL (ref 6–23)
CO2: 34 mEq/L — ABNORMAL HIGH (ref 19–32)
Calcium: 7.8 mg/dL — ABNORMAL LOW (ref 8.4–10.5)
Chloride: 97 mEq/L (ref 96–112)
Chloride: 94 mEq/L — ABNORMAL LOW (ref 96–112)
Creatinine, Ser: 1.12 mg/dL (ref 0.4–1.2)
GFR calc Af Amer: >60 mL/min (ref >60)
GFR calc Af Amer: >60 mL/min (ref >60)
Glucose, Bld: 107 mg/dL — ABNORMAL HIGH (ref 70–99)
Potassium: 3.6 mEq/L (ref 3.5–5.1)
Sodium: 139 mEq/L (ref 135–145)
Sodium: 133 mEq/L — ABNORMAL LOW (ref 135–145)

## 2011-02-03 LAB — MRSA PCR SCREENING: MRSA by PCR: NEGATIVE

## 2011-02-03 MED ORDER — IOHEXOL 300 MG/ML  SOLN
100.0000 mL | Freq: Once | INTRAMUSCULAR | Status: AC | PRN
  Administered 2011-02-03: 100 mL via INTRAVENOUS

## 2011-02-04 LAB — BASIC METABOLIC PANEL
BUN: 12 mg/dL (ref 6–23)
CO2: 34 mEq/L — ABNORMAL HIGH (ref 19–32)
Chloride: 98 mEq/L (ref 96–112)
Glucose, Bld: 125 mg/dL — ABNORMAL HIGH (ref 70–99)
Potassium: 3.8 mEq/L (ref 3.5–5.1)
Sodium: 139 mEq/L (ref 135–145)

## 2011-02-05 LAB — BASIC METABOLIC PANEL
BUN: 13 mg/dL (ref 6–23)
CO2: 33 mEq/L — ABNORMAL HIGH (ref 19–32)
Chloride: 95 mEq/L — ABNORMAL LOW (ref 96–112)
Creatinine, Ser: 0.99 mg/dL (ref 0.4–1.2)
Potassium: 4.1 mEq/L (ref 3.5–5.1)

## 2011-02-05 LAB — EXPECTORATED SPUTUM ASSESSMENT W GRAM STAIN, RFLX TO RESP C

## 2011-02-06 ENCOUNTER — Inpatient Hospital Stay (HOSPITAL_COMMUNITY): Payer: Medicare Other

## 2011-02-06 DIAGNOSIS — I509 Heart failure, unspecified: Secondary | ICD-10-CM

## 2011-02-06 DIAGNOSIS — J9600 Acute respiratory failure, unspecified whether with hypoxia or hypercapnia: Secondary | ICD-10-CM

## 2011-02-06 LAB — BASIC METABOLIC PANEL
BUN: 10 mg/dL (ref 6–23)
Chloride: 96 mEq/L (ref 96–112)
Creatinine, Ser: 1 mg/dL (ref 0.4–1.2)
Glucose, Bld: 113 mg/dL — ABNORMAL HIGH (ref 70–99)
Potassium: 4.3 mEq/L (ref 3.5–5.1)

## 2011-02-07 LAB — BASIC METABOLIC PANEL
BUN: 12 mg/dL (ref 6–23)
CO2: 32 mEq/L (ref 19–32)
Calcium: 8.8 mg/dL (ref 8.4–10.5)
Chloride: 94 mEq/L — ABNORMAL LOW (ref 96–112)
Creatinine, Ser: 0.91 mg/dL (ref 0.4–1.2)
Glucose, Bld: 114 mg/dL — ABNORMAL HIGH (ref 70–99)

## 2011-02-07 LAB — CULTURE, RESPIRATORY W GRAM STAIN: Culture: NORMAL

## 2011-02-07 NOTE — Consult Note (Signed)
NAMERHIA, BLATCHFORD              ACCOUNT NO.:  [id number]  MEDICAL RECORD NO.:  [id number]           PATIENT TYPE:  I  LOCATION:  1428                         FACILITY:  [facility]  PHYSICIAN:  Cindra Eves, D.D.S.DATE OF BIRTH:  03/22/1966  DATE OF CONSULTATION:  02/06/2011 DATE OF DISCHARGE:                                CONSULTATION   [name] is a 45 year old female referred by Dr. Marcelyn Bruins for dental consultation.  The patient was recently admitted with acute on chronic respiratory failure.  Dental consultation was requested for evaluation of toothache symptoms involving the lower left quadrant.  MEDICAL HISTORY: 1. Acute-on-chronic respiratory failure-- reason for this admission.     The patient is followed by Dr. Marcelyn Bruins. 2. Heart failure. 3. COPD. 4. Chronic rhinitis 5. Hypertension. 6. Obstructive sleep apnea with CPAP noncompliance. 7. Gastroesophageal reflux disorder. 8. History of pulmonary sarcoidosis diagnosed in 1994.9. Chronic pain syndrome primarily involving neck and shoulders     radiating to the back. 10.Fibromyalgia. 11.Anxiety disorder.  ALLERGIES/ADVERSE DRUG REACTIONS: 1. LATEX causes itching and rash. 2. CIPRO causes vomiting. 3. BACTRIM causes vomiting. 4. ZITHROMAX - resistant. 5. DOXYCYCLINE - resistant.  MEDICATIONS: 1. Albuterol inhalation therapy every 6 hours. 2. Pulmicort inhalation therapy every 6 hours. 3. Lovenox 70 mg subcutaneously every 24 hours. 4. Flonase nasally daily. 5. Protonix 40 mg twice daily. 6. Zosyn 3.375 g IV every 8 hours. 7. K-Dur 20 mEq twice daily. 8. Spiriva inhalation therapy daily. 9. Torsemide 100 mg IV daily. 10.Zantac 300 mg at bedtime. 11.Senokot-S twice daily.  SOCIAL HISTORY:  The patient currently separated with no children.  The patient moved from [place] to [place] in March 2011.  The patient is currently disabled.  The patient with a history of smoking one half to one  pack per day for 30 years.  The patient indicates that she quit on September 14, 2010.  The patient previously worked as a Lawyer. The patient does not use alcohol.  FAMILY HISTORY:  Mother died at age of 68 with a history of hypertension, diabetes mellitus and stroke.  Father died at age 49 from stomach cancer.  FUNCTIONAL ASSESSMENT:  The patient remains independent for basic ADLs but does need help with certain instrumental ADLs.  REVIEW OF SYSTEMS:  Reviewed from the chart for this admission.  DENTAL HISTORY:  CHIEF COMPLAINT:  Dental consultation requested to evaluate lower left quadrant toothache.  HISTORY OF PRESENT ILLNESS:  The patient gives a history of lower left tooth pain.  The patient points to lower left molar - tooth #18.  The patient describes the pain is being sharp and lasts for hours at a time. The patient indicates that it is 0/10 since she just had her pain medication but had several hours ago at 10/10 intensity by her report. The patient indicates that tooth has been turning off and on for a year. The patient does give a history of spontaneous pain that did wake the patient up.  The patient indicates that she last saw a dentist to have her tooth pulled.  This was in [place].  The patient denies complications  from that dental extraction.  The patient has been previously evaluated for this lower left toothache.  The patient without partial dentures.  DENTAL EXAMINATION:  GENERAL:  The patient is a well-developed, well- nourished female in no acute distress. VITAL SIGNS:  Blood pressure is 106/71, pulse rate 80, respirations are 18, temperature is 97.7. HEAD AND NECK:  There is some left submandibular lymphadenopathy.  The area on the left jaw bone is tender to palpation.  No right neck lymphadenopathy is noted.  The patient denies acute TMJ symptoms.  The patient denies symptoms of trismus. INTRAORAL:  The patient with normal saliva.  There is evidence of  some residual buccal abscess in the area of #18.  There also is evidence of a fistulous tract that most likely has drained recently.  Tooth #18 is tender to palpation. DENTITION:  The patient with multiple missing teeth.  The patient has missing tooth numbers 1, 3, 14, 15, 16, 17, 19, 20, 28, 29, 30, 31 and 32. PERIODONTAL:  The patient with chronic periodontitis with plaque and calculus accumulations, selective areas of gingival recession and some tooth mobility. DENTAL CARIES:  No obvious dental caries are noted at this time.  I would need a full series dental radiographs to identify the extent of the dental caries. ENDODONTIC:  The patient with a history of acute pulpitis symptoms involving tooth #18.  There appears to be a buccal abscess as well as fistulous tract on buccal of #18. CROWN OR BRIDGE:  There are no crown or bridge restorations. PROSTHODONTIC:  The patient denies presence of partial dentures. OCCLUSION:  The patient with a poor occlusal scheme secondary to multiple missing teeth, supereruption drifting of the unopposed teeth into the edentulous areas and lack of replacing the missing teeth with dental prostheses.  RADIOGRAPHIC INTERPRETATION:  A suboptimal panoramic x-ray was taken on Feb 03, 2011.  There are multiple missing teeth.  There is supereruption and drifting of the unopposed teeth into the edentulous areas.  There is periapical radiolucency associated with the roots of tooth #18.  There is an incipient to moderate bone loss.  There are multiple dental restorations noted.  ASSESSMENT: 1. History of acute pulpitis symptoms involving tooth #18. 2. History of buccal abscess with fistulous tract in the area of #18. 3. Apical periodontitis and periapical radiolucency affecting tooth     #18. 4. Multiple missing teeth. 5. Supereruption drifting of the unopposed teeth into the edentulous     areas. 6. No history of partial dentures. 7. Chronic  periodontitis bone loss. 8. Plaque and calculus accumulations. 9. Gingival recession. 10.Incipient tooth mobility. 11.Current Lovenox therapy with a risk for bleeding with invasive     dental procedures. 12.Significant medical comorbidities.  PLAN/RECOMMENDATION: 1. I discussed the risks, benefits, and complications of various     treatment options with the patient in relationship to her medical     and dental conditions.  We discussed various treatment options to     include no treatment, selective extraction of tooth #18 with     alveoloplasty, periodontal therapy, dental restorations, root canal     therapy, crown or bridge therapy, implant therapy, replacing     missing teeth as indicated.  The patient currently wishes to     proceed with extraction of tooth #18 with alveoloplasty as     indicated in the operating room with monitored anesthesia care.     This has been scheduled for Wednesday at 8:30 a.m. pending further  discussion with Dr. Marcelyn Bruins concerning the medical     ability/debility of the patient to undergo dental procedures as     planned.  The patient will then follow with a dentist of her choice     for evaluation for replacing her missing teeth as indicated after     adequate healing.  In the meantime, the patient will be continued    on her IV antibiotic therapy and most likely will be able to be     discharge later Wednesday afternoon after dental procedures are     completed. 2. Discussion of findings with Dr. Shelle Iron and determination of the     medical ability/debility of the patient to undergo dental     procedures as planned.          ______________________________ Cindra Eves, D.D.S.     RK/MEDQ  D:  02/06/2011  T:  02/07/2011  Job:  [id number]  cc:   Barbaraann Share, MD,FCCP  Electronically Signed by Cindra Eves D.D.S. on 02/07/2011 10:46:31 AM

## 2011-02-08 ENCOUNTER — Other Ambulatory Visit: Payer: Self-pay | Admitting: Pulmonary Disease

## 2011-02-08 DIAGNOSIS — G4733 Obstructive sleep apnea (adult) (pediatric): Secondary | ICD-10-CM

## 2011-02-08 DIAGNOSIS — K046 Periapical abscess with sinus: Secondary | ICD-10-CM

## 2011-02-08 DIAGNOSIS — K0401 Reversible pulpitis: Secondary | ICD-10-CM

## 2011-02-08 DIAGNOSIS — K0530 Chronic periodontitis, unspecified: Secondary | ICD-10-CM

## 2011-02-08 LAB — BASIC METABOLIC PANEL
BUN: 12 mg/dL (ref 6–23)
Calcium: 9.1 mg/dL (ref 8.4–10.5)
Chloride: 96 mEq/L (ref 96–112)
Creatinine, Ser: 1.1 mg/dL (ref 0.4–1.2)
GFR calc non Af Amer: 54 mL/min — ABNORMAL LOW (ref >60)

## 2011-02-08 LAB — CBC
MCH: 23.9 pg — ABNORMAL LOW (ref 26.0–34.0)
MCHC: 31 g/dL (ref 30.0–36.0)
MCV: 77 fL — ABNORMAL LOW (ref 78.0–100.0)
Platelets: 154 10*3/uL (ref 150–400)
RDW: 16.4 % — ABNORMAL HIGH (ref 11.5–15.5)

## 2011-02-08 LAB — SURGICAL PCR SCREEN: Staphylococcus aureus: NEGATIVE

## 2011-02-09 NOTE — Op Note (Signed)
[name], [name]              ACCOUNT NO.:  [id number]  MEDICAL RECORD NO.:  [id number]           PATIENT TYPE:  I  LOCATION:  1428                         FACILITY:  [facility]  PHYSICIAN:  Cindra Eves, D.D.S.DATE OF BIRTH:  1966-08-29  DATE OF PROCEDURE:  02/08/2011 DATE OF DISCHARGE:  02/09/2011                              OPERATIVE REPORT   PREOPERATIVE DIAGNOSES: 1. Heart failure. 2. History of acute on chronic respiratory failure. 3. Apical periodontitis. 4. Acute pulpitis. 5. Periapical abscess in the area #18.  POSTOPERATIVE DIAGNOSES: 1. Heart failure. 2. History of acute on chronic respiratory failure. 3. Apical periodontitis. 4. Acute pulpitis. 5. Periapical abscess in the area #18.  OPERATIONS:  Extraction of tooth #18 with alveoloplasty.  SURGEON:  Cindra Eves, D.D.S.  ASSISTANT:  Public house manager (Sales executive)..  ANESTHESIA:  Monitored anesthesia care per the anesthesia team..  MEDICATIONS: 1. Previous Zosyn IV antibiotic therapy as per protocol. 2. Local anesthesia with a total utilization one carpule containing 34     mg of lidocaine with 0.017 mg of epinephrine as well as one carpule     containing 9 mg of bupivacaine with 0.009 mg of epinephrine.  SPECIMENS:  There was one tooth that was discarded.  DRAINS:  None.  CULTURES:  None.  COMPLICATIONS:  None.  ESTIMATED BLOOD LOSS:  Minimal.  FLUIDS:  300 mL lactated Ringer solution.  INDICATIONS:  The patient was admitted with a history of acute on chronic respiratory failure.  The patient subsequently developed toothache symptoms and a dental consultation was requested.  The patient was examined and treatment planned for extraction of tooth #18 with alveoloplasty as indicated.  This treatment plan was formulated to decrease the risk and complications associated dental infection from further affecting the patient's systemic health.  OPERATIVE FINDINGS:  The patient was  examined in operating room #12. Tooth #18 was identified for extraction.  The patient noted be affected by history of acute pulpitis, had periapical abscess in the area #18, chronic periodontitis.  DESCRIPTION OF PROCEDURE:  The patient was brought to main operating room #12.  The patient was then placed in supine position on the operating room table.  Monitored anesthesia care was then induced per the Anesthesia Team.  The patient then prepped and draped in usual manner for dental medicine procedure.  A time-out was performed.  The patient identified and procedures were verified.  The oral cavity was then thoroughly examined with findings noted above.  The patient was then ready for the dental medicine procedure as follows:  Local anesthesia administered with a total utilization one carpule containing 9 mg of bupivacaine with 0.009 mg of epinephrine and further infiltration utilizing one carpule containing 34 mg of lidocaine with 0.017 mg of epinephrine.  The mandibular left quadrant was first approached.  The patient given an inferior alveolar and long buccal nerve block utilizing the bupivacaine with epinephrine.  Further infiltration was then achieved utilizing lidocaine with epinephrine.  Adequate time was then given to allow the anesthesia to take effect.  At this point in time, the 15 blade incision was made from distal #17 extended the mesial  of #19, surgical flap was then carefully reflected, appropriate amounts of buccal and interseptal bone were removed appropriately.  Tooth #18 was then subluxated with a series of straight elevators and eventually removed with a 70 forceps without further complications.  Alveoplasty was then performed utilizing rongeurs and bone file.  Surgical site was irrigated with copious amounts of sterile saline.  There was no evidence of abscess or purulence noted at this time.  The tissues were then approximated and trimmed appropriately.  Surgical  site was then closed after placement of Gelfoam in the extraction socket.  The surgical site was closed from distal #17 extended to mesial #19 utilizing 3-0 chromic gut suture in a continuous interrupted suture technique x1.  At this point in time, entire mouth was evaluated for complications; seeing none, dental medicine procedure deemed to be complete.  A series of 4x4 gauze was placed in the mouth to aid hemostasis.  The patient was then handed over to Anesthesia Team for final disposition.  After appropriate amount of time, the patient was taken to the postanesthesia care unit in good condition.  All counts were correct for dental medicine procedure.  The patient will be given appropriate pain medication and will be seen in approximately 7 to 10 days for evaluation for suture removal.  Ultimate disposition for discharge is up to Dr. Marcelyn Bruins.  I would recommend additional 4 to 5 days worth of Augmentin therapy at discharge.  The patient will then need to follow up with a dentist of her choice for overall examination, x-rays and treatment planning for replacement of missing teeth as indicated.          ______________________________ Cindra Eves, D.D.S.     RK/MEDQ  D:  02/08/2011  T:  02/08/2011  Job:  [id number]  cc:   Barbaraann Share, MD,FCCP [address] [place] [address]  Electronically Signed by Cindra Eves D.D.S. on 02/09/2011 02:17:53 PM

## 2011-02-10 ENCOUNTER — Telehealth: Payer: Self-pay | Admitting: Pulmonary Disease

## 2011-02-10 ENCOUNTER — Other Ambulatory Visit: Payer: Self-pay | Admitting: Internal Medicine

## 2011-02-10 DIAGNOSIS — G4730 Sleep apnea, unspecified: Secondary | ICD-10-CM

## 2011-02-10 NOTE — Telephone Encounter (Signed)
Attempted to call patient. Let phone ring for 25 + rings. No answer, no answering machine. Unable to leave a message.

## 2011-02-10 NOTE — Telephone Encounter (Signed)
Contacted pt as to reason she wanted to change dme. Pt stated that she ran out of back up tanks and [facility] told her that she would have to bring them up there. Pt had no transportation. Advise pt that since it was almost 6:00 pm that I would have to start addressing this on Monday. Pt understood. Rhonda Cobb

## 2011-02-14 ENCOUNTER — Other Ambulatory Visit: Payer: Self-pay | Admitting: Pulmonary Disease

## 2011-02-14 DIAGNOSIS — J449 Chronic obstructive pulmonary disease, unspecified: Secondary | ICD-10-CM

## 2011-02-14 DIAGNOSIS — G4730 Sleep apnea, unspecified: Secondary | ICD-10-CM

## 2011-02-14 NOTE — Telephone Encounter (Signed)
Order placed and sent to [facility]Carron Curie, CMA

## 2011-02-14 NOTE — Telephone Encounter (Signed)
Pt is requesting to change her Auto Bipap and oxygen back to [facility]. Pt has already been set up on Auto B[facility]ap from [facility], however, she states that she had better service with [facility] and is requesting to change everything back to them. Please send order to [facility]. Thanks,

## 2011-02-14 NOTE — Telephone Encounter (Signed)
Pt is requesting to change her oxygen and

## 2011-02-15 ENCOUNTER — Other Ambulatory Visit: Payer: Self-pay | Admitting: Internal Medicine

## 2011-02-15 NOTE — Telephone Encounter (Signed)
Faxed script back to [facility] @ [phone].Marland KitchenMarland Kitchen5/23/12@1 :24pm/LMB

## 2011-02-16 ENCOUNTER — Emergency Department (HOSPITAL_COMMUNITY)
Admission: EM | Admit: 2011-02-16 | Discharge: 2011-02-16 | Disposition: A | Discharge status: Home / Self Care | Payer: Medicare Other | Attending: Emergency Medicine | Admitting: Emergency Medicine

## 2011-02-16 ENCOUNTER — Emergency Department (HOSPITAL_COMMUNITY): Payer: Medicare Other

## 2011-02-16 DIAGNOSIS — M79609 Pain in unspecified limb: Secondary | ICD-10-CM | POA: Insufficient documentation

## 2011-02-16 DIAGNOSIS — M545 Low back pain, unspecified: Secondary | ICD-10-CM | POA: Insufficient documentation

## 2011-02-16 DIAGNOSIS — D869 Sarcoidosis, unspecified: Secondary | ICD-10-CM | POA: Insufficient documentation

## 2011-02-16 DIAGNOSIS — M25559 Pain in unspecified hip: Secondary | ICD-10-CM | POA: Insufficient documentation

## 2011-02-16 DIAGNOSIS — Z79899 Other long term (current) drug therapy: Secondary | ICD-10-CM | POA: Insufficient documentation

## 2011-02-16 DIAGNOSIS — IMO0001 Reserved for inherently not codable concepts without codable children: Secondary | ICD-10-CM | POA: Insufficient documentation

## 2011-02-16 DIAGNOSIS — I10 Essential (primary) hypertension: Secondary | ICD-10-CM | POA: Insufficient documentation

## 2011-02-16 DIAGNOSIS — F341 Dysthymic disorder: Secondary | ICD-10-CM | POA: Insufficient documentation

## 2011-02-16 DIAGNOSIS — G4733 Obstructive sleep apnea (adult) (pediatric): Secondary | ICD-10-CM

## 2011-02-16 DIAGNOSIS — J438 Other emphysema: Secondary | ICD-10-CM | POA: Insufficient documentation

## 2011-02-16 DIAGNOSIS — M5430 Sciatica, unspecified side: Secondary | ICD-10-CM | POA: Insufficient documentation

## 2011-02-16 NOTE — Procedures (Signed)
[name], [name]              ACCOUNT NO.:  [id number]  MEDICAL RECORD NO.:  [id number]          PATIENT TYPE:  OUT  LOCATION:  SLEEP CENTER                 FACILITY:  [facility]  PHYSICIAN:  Barbaraann Share, MD,FCCPDATE OF BIRTH:  Jan 28, 1966  DATE OF STUDY:  01/31/2011                           NOCTURNAL POLYSOMNOGRAM  REFERRING PHYSICIAN:  Kline Bulthuis M Tyresse Jayson  LOCATION:  Sleep Lab.  INDICATION FOR STUDY:  Hypersomnia with sleep apnea.  The patient also has known obesity hypoventilation syndrome.  EPWORTH SLEEPINESS SCORE:  10.  MEDICATIONS:  SLEEP ARCHITECTURE:  The patient had a total sleep time of 337 minutes with no slow wave sleep and 48 minutes of REM.  Sleep onset latency was mildly increased at 32 minutes, and REM onset was very rapid at 14 minutes.  Sleep efficiency was moderately reduced at 80%.  RESPIRATORY DATA:  The patient underwent a bilevel titration study with a medium ResMed Quattro full-face mask.  She was initially started at a pressure of 8/4, and the pressure was gradually increased in order to control obstructive events and also snoring.  The patient was noted to have intermittent hypopneas, but there is significant snoring until a pressure of 17/60 was reached.  The patient seemed to have good tolerance at this level.  OXYGEN DATA:  The patient had O2 desaturation as low as 80% associated with her obstructive events and also spontaneously.  Oxygen at 2 L per minute was added to her treatment, and she maintained adequate saturations on this once optimal bilevel pressure was reached.  CARDIAC DATA:  Rare PAC and PVC noted.  MOVEMENT-PARASOMNIA:  The patient had no significant leg jerks or other abnormal behaviors noted.  IMPRESSIONS-RECOMMENDATIONS: 1. Good control of previously-documented obstructive sleep apnea with     a bilevel pressure of 17/6 and 2 L per minute of nasal oxygen.  The     patient was fitted with a medium ResMed Quattro full-face  mask for     the study.     She should also be encouraged to work aggressively on weight loss. 2. Rare PAC and PVC noted, but no clinically significant arrhythmias     were seen.     Barbaraann Share, MD,FCCP Diplomate, American Board of Sleep Medicine Electronically Signed    [facility]/MEDQ  D:  02/16/2011 08:51:03  T:  02/16/2011 21:53:10  Job:  [id number]

## 2011-02-17 ENCOUNTER — Ambulatory Visit (INDEPENDENT_AMBULATORY_CARE_PROVIDER_SITE_OTHER): Payer: Medicare Other | Admitting: Internal Medicine

## 2011-02-17 ENCOUNTER — Encounter: Payer: Self-pay | Admitting: Internal Medicine

## 2011-02-17 VITALS — BP 120/68 | HR 87 | Temp 98.4°F | Ht 68.5 in | Wt 297.0 lb

## 2011-02-17 DIAGNOSIS — I509 Heart failure, unspecified: Secondary | ICD-10-CM

## 2011-02-17 DIAGNOSIS — M5431 Sciatica, right side: Secondary | ICD-10-CM | POA: Insufficient documentation

## 2011-02-17 DIAGNOSIS — M5430 Sciatica, unspecified side: Secondary | ICD-10-CM

## 2011-02-17 DIAGNOSIS — I5032 Chronic diastolic (congestive) heart failure: Secondary | ICD-10-CM

## 2011-02-17 NOTE — Progress Notes (Signed)
Subjective:    [name], female    DOB: 05-May-1966, 45 y.o.   MRN: [id number]  HPI   Here for follow up -in ER last PM due to numbness and pain in RLE - improved but ?herniated disc (xray with min loss L4-5) - would like to see back specialist Also IP diuresis hosp 01/2011 for a/c diastolic hf -   Also reviewed chronic medical issues:  chronic hypoxic resp failure - home O2 dep since 2010 - overlap with OSA/OHS and obst dz per pulm - reports compliance with ongoing medical treatment and home O2 and no changes in medication dose or frequency. denies adverse side effects related to current therapy.  chronic dyspnea and congestion sensation improved since 01/2011 hosp for diuresis- Quit smoking 08/2010!!  HTN - reports compliance with ongoing medical treatment and no changes in medication dose or frequency. denies adverse side effects related to current therapy and rash from dilt  resolved with change back to amlodipine  dCHF, chronic with acute exac requiring hosp for diuresis earlier this month - dc summary and tests reviewed - IP echo 01/2011 reviewed - reports compliance with ongoing medical treatment and no changes in medication dose or frequency. denies adverse side effects related to current therapy. improved edema and dyspnea  - continues bid diuretic  morbid obesity - weights reviewed -  anxiety - chronic hx same with daily BZ use - changed from klonopin to xanax 07/2010 due to "too sleepy"   GERD - reports compliance with ongoing medical treatment and no changes in medication dose or frequency. denies adverse side effects related to current therapy.   dyslipidemia -started on simva spring 2011- reports intermittent compliance with ongoing medical treatment and no changes in medication dose or frequency. denies adverse side effects related to current therapy.   fibromyalgia - chronic pain - on daily narcotics for years - would like to reduce dose if possible (reports on  "10mg " while in [place] - now on "5" - but note prev hydrocod, now oxy) - pain affects chest, back, legs and "whole body" - resume flexeril  Past Medical History  Diagnosis Date  . Sleep apnea     noncompliant w/ CPAP  . COPD (chronic obstructive pulmonary disease)     moderate airflow obstruction, suspect d/t emphysema  . Hypertension   . Hyperlipidemia   . Chronic headache   . Fibromyalgia     daily narcotics  . Anxiety     hx chronic BZ use, stopped 07/2010  . Anemia   . Pulmonary sarcoidosis     unimpressive CT chest 2011  . Colonic polyp   . GERD (gastroesophageal reflux disease)   . ALLERGIC RHINITIS   . Asthma     Review of Systems  Constitutional: Negative for fever.  Respiratory: Negative for cough.   Neurological: Negative for headaches.       Objective:   Physical Exam  Constitutional: She is oriented to person, place, and time. She appears well-developed and well-nourished.       Morbidly obese.  Cardiovascular: Normal rate, regular rhythm and normal heart sounds.        BLE with 1+ edema (much improved since prior OV)  Pulmonary/Chest: Breath sounds normal. No respiratory distress. She has no wheezes.  Musculoskeletal:       Back: full range of motion of thoracic and lumbar spine. Non tender to palpation.  DTR's are symmetrically intact. Sensation intact in all dermatomes of the lower extremities. Full strength  to manual muscle testing and able to heel toe walk without difficulty; ambulates with a normal gait.   Neurological: She is alert and oriented to person, place, and time. Coordination normal.  Psychiatric: She has a normal mood and affect. Her behavior is normal.   BP 120/68  Pulse 87  Temp(Src) 98.4 F (36.9 C) (Oral)  Ht 5' 8.5" (1.74 m)  Wt 297 lb (134.718 kg)  BMI 44.50 kg/m2  SpO2 96%  LMP 01/28/2011 Wt Readings from Last 3 Encounters:  02/17/11 297 lb (134.718 kg)  02/02/11 321 lb 9.6 oz (145.877 kg)  01/20/11 304 lb (137.893 kg)         Assessment & Plan:  See problem list. Medications and labs reviewed today. Time spent with pt 25 minutes, greater than 50% time spent counseling patient on dCHF and >20#diuresis, recent ER visit for RLE sciatica, and medication review. Also review of prior records and refer to back specialist for RLE sciatica and to [facility] cardiology for [facility] co-mgmt

## 2011-02-17 NOTE — Assessment & Plan Note (Signed)
ER visit for RLE numbness and pain - reviewed plain film -  symptoms improved today but recurrent - will refer to back specialist for eval/tx - No change in chronic pain mgmt as ongoing

## 2011-02-17 NOTE — Patient Instructions (Signed)
It was good to see you today. we'll make referral to [facility] cardiology and to back specialist as discussed. Our office will contact you regarding appointment(s) once made. We have reviewed your prior records including labs and tests today Medications reviewed, no changes at this time. Please keep scheduled follow up as planned, call sooner if problems.

## 2011-02-17 NOTE — Assessment & Plan Note (Signed)
S/p 01/2011 hosp for diuresis - continue torsemide bid and reducing steroid dose as tolerated Refer to [facility] cards for assistance with co-mgmt Reports upcoming sleep study - ?need BiPAP - defer to [facility]

## 2011-02-21 ENCOUNTER — Encounter: Payer: Self-pay | Admitting: Cardiology

## 2011-02-21 ENCOUNTER — Ambulatory Visit (INDEPENDENT_AMBULATORY_CARE_PROVIDER_SITE_OTHER): Payer: Medicare Other | Admitting: Cardiology

## 2011-02-21 DIAGNOSIS — I10 Essential (primary) hypertension: Secondary | ICD-10-CM

## 2011-02-21 DIAGNOSIS — I509 Heart failure, unspecified: Secondary | ICD-10-CM

## 2011-02-21 DIAGNOSIS — G4733 Obstructive sleep apnea (adult) (pediatric): Secondary | ICD-10-CM

## 2011-02-21 DIAGNOSIS — R9430 Abnormal result of cardiovascular function study, unspecified: Secondary | ICD-10-CM | POA: Insufficient documentation

## 2011-02-21 NOTE — Assessment & Plan Note (Signed)
Blood pressure is controlled today.  Very careful attention to her blood pressure will be very important to control her diastolic heart failure.  As part of today's evaluation I have reviewed prior office notes by her primary physicians and her pulmonary physician.  I have also reviewed hospital records and last echo reports.

## 2011-02-21 NOTE — Progress Notes (Signed)
HPI The patient is seen today for cardiology consultation concerning the question of diastolic congestive heart failure.  The patient has significant lung disease.  She uses home O2.  She has sleep apnea.  In the past she has not been compliant with her BiPAP but she is doing better with this now.  She was hospitalized in May, 2012.  She did have a significant component of volume overload.  She was diuresed and she is doing better.  She is watching her salt intake.  However she still needs more education about her salt intake.  She also needs significant help concerning her total fluid intake.  She drinks excessive water related to feeling thirsty. Allergies  Allergen Reactions  . Azithromycin     REACTION: resistant  . Ciprofloxacin     REACTION: nausea  . Doxycycline     REACTION: restistant  . Latex     REACTION: rash, burning  . Sulfamethoxazole W/Trimethoprim     Bactrim REACTION: Projectile Vomiting    Current Outpatient Prescriptions  Medication Sig Dispense Refill  . albuterol (PROVENTIL) (2.5 MG/3ML) 0.083% nebulizer solution Take 3 mLs (2.5 mg total) by nebulization every 6 (six) hours as needed.  120 vial  3  . ALPRAZolam (XANAX) 0.5 MG tablet TAKE 1 TABLET BY MOUTH TWICE DAILY  60 tablet  0  . amLODipine (NORVASC) 5 MG tablet Take 5 mg by mouth daily.        . budesonide-formoterol (SYMBICORT) 160-4.5 MCG/ACT inhaler Inhale 2 puffs into the lungs 2 (two) times daily.        . cetirizine (ZYRTEC) 10 MG chewable tablet Chew 10 mg by mouth at bedtime.       . fluticasone (FLONASE) 50 MCG/ACT nasal spray 2 sprays by Nasal route every morning.        Marland Kitchen omeprazole (PRILOSEC) 40 MG capsule TAKE 1 CAPSULE BY MOUTH DAILY  30 capsule  5  . oxyCODONE-acetaminophen (PERCOCET) 5-325 MG per tablet Take 1 tablet by mouth 2 (two) times daily as needed.  60 tablet  0  . potassium chloride SA (K-DUR,KLOR-CON) 20 MEQ tablet Take 20 mEq by mouth 3 (three) times daily.        Marland Kitchen SPIRIVA HANDIHALER  18 MCG inhalation capsule USE ONCE DAILY AS DIRECTED  30 capsule  0  . torsemide (DEMADEX) 100 MG tablet Take 100 mg by mouth 2 (two) times daily.        Marland Kitchen DISCONTD: naproxen sodium (ANAPROX) 220 MG tablet Take 220 mg by mouth as needed.          History   Social History  . Marital Status: Single    Spouse Name: N/A    Number of Children: N/A  . Years of Education: N/A   Occupational History  . Not on file.   Social History Main Topics  . Smoking status: Former Smoker -- 1.0 packs/day for 29 years    Quit date: 09/14/2010  . Smokeless tobacco: Not on file  . Alcohol Use: Not on file  . Drug Use: No  . Sexually Active: Not on file   Other Topics Concern  . Not on file   Social History Narrative   Pt is single. No children, and disaability    Family History  Problem Relation Age of Onset  . Hypertension Mother   . Emphysema Father   . Hypertension Father   . Stomach cancer Father   . Allergies Brother   . Hypertension Brother   .  Stomach cancer Brother   . Heart disease Mother   . Heart disease Father   . Heart disease Brother     Past Medical History  Diagnosis Date  . Sleep apnea     noncompliant w/ CPAP  . COPD (chronic obstructive pulmonary disease)     moderate airflow obstruction, suspect d/t emphysema  . Hypertension   . Hyperlipidemia   . Chronic headache   . Fibromyalgia     daily narcotics  . Anxiety     hx chronic BZ use, stopped 07/2010  . Anemia   . Pulmonary sarcoidosis     unimpressive CT chest 2011  . Colonic polyp   . GERD (gastroesophageal reflux disease)   . ALLERGIC RHINITIS   . Asthma   . CHF (congestive heart failure)     Diastolic with fluid overload, May, 2012  . Ejection fraction     EF 60%, echo, May, 2012    Past Surgical History  Procedure Date  . Polypectomy 2011    ROS  Patient denies fever, chills, headache, sweats, rash, change in vision, change in hearing, chest pain, nausea vomiting, urinary symptoms.  She  does have a chronic cough.  This gets worse when she is fluid overloaded.  It is stable at this time.  All of the systems are reviewed and are negative.  PHYSICAL EXAM Patient is overweight.  She is oriented to person time and place.  Affect is normal.  Head is atraumatic.  There is no jugular venous extension.  Lungs revealed distant lung sounds.  She is wearing oxygen.  There is no respiratory distress.  Cardiac exam reveals an S1-S2.  There are no clicks or significant murmurs.  Her abdomen is soft.  There is only trace peripheral edema today.  There are no musculoskeletal deformities.  There are no skin rashes. Filed Vitals:   02/21/11 1118  BP: 115/80  Pulse: 68  Resp: 19  Height: 5\' 8"  (1.727 m)  Weight: 296 lb 6.4 oz (134.446 kg)    EKG from May,14,2012 is reviewed by me today.  There is normal sinus rhythm very  ASSESSMENT & PLAN

## 2011-02-21 NOTE — Patient Instructions (Signed)
Your physician has recommended that you follow a low sodium diet. Your physician has recommended that you limit your fluid intake to 2 quarts daily Your physician wants you to follow-up in: 6 months.  You will receive a reminder letter in the mail two months in advance. If you don't receive a letter, please call our office to schedule the follow-up appointment.

## 2011-02-21 NOTE — Assessment & Plan Note (Addendum)
The patient does have evidence of chronic diastolic heart failure.  Her last echo in May, 2012 revealed an ejection fraction of 60%.  There was grade 2 diastolic dysfunction.  She has good right ventricular function.  I have spoken with her about her salt intake and fluid intake. She is on a diuretic.  It will be very important for her to learn to follow her salt intake carefully.  She also needs to learn to drink no more than 2000 cc of fluid daily.  This is not a severe restriction and she should be able to do this.  No other testing is needed at this point.  Of course she'll be careful attention to ongoing treatment of her pulmonary disease. I have instructed her that a liter is similar to a quart.  She should limit her fluid intake to no more than 2 quarts daily.

## 2011-02-21 NOTE — Assessment & Plan Note (Signed)
Wearing her device at home is very important in stabilizing her diastolic heart failure also.  I strongly recurred her to use it.

## 2011-02-22 ENCOUNTER — Other Ambulatory Visit: Payer: Self-pay

## 2011-02-22 DIAGNOSIS — G8929 Other chronic pain: Secondary | ICD-10-CM

## 2011-02-22 MED ORDER — OXYCODONE-ACETAMINOPHEN 5-325 MG PO TABS: 1.0000 | ORAL_TABLET | Freq: Two times a day (BID) | ORAL | Status: DC | PRN

## 2011-02-22 NOTE — Telephone Encounter (Signed)
Pt informed, Rx in cabinet for pt pick up  

## 2011-02-22 NOTE — Telephone Encounter (Signed)
Pt called requesting refill of pain medication to pick up Fri June 1st as she has appt in [place] building

## 2011-02-23 ENCOUNTER — Encounter: Payer: Self-pay | Admitting: Pulmonary Disease

## 2011-02-24 ENCOUNTER — Inpatient Hospital Stay: Payer: Medicare Other | Admitting: Pulmonary Disease

## 2011-03-01 ENCOUNTER — Telehealth: Payer: Self-pay | Admitting: Pulmonary Disease

## 2011-03-01 NOTE — Telephone Encounter (Signed)
Will forward to [facility] to handle- pls help thanks

## 2011-03-01 NOTE — Discharge Summary (Signed)
[name], [name]              ACCOUNT NO.:  [id number]  MEDICAL RECORD NO.:  [id number]           PATIENT TYPE:  I  LOCATION:  1428                         FACILITY:  [facility]  PHYSICIAN:  Barbaraann Share, MD,FCCPDATE OF BIRTH:  1966/06/05  DATE OF ADMISSION:  02/02/2011 DATE OF DISCHARGE:  02/09/2011                              DISCHARGE SUMMARY   FINAL DIAGNOSES: 1. Acute-on-chronic respiratory failure in the setting of     decompensated diastolic heart failure/biventricular heart failure     with resultant pulmonary edema. 2. Obesity hypoventilation syndrome and obstructive sleep apnea. 3. Chronic obstructive pulmonary disease. 4. Dental abscess.  CONSULTANTS:  Cindra Eves, D.D.S.  PROCEDURES:  Dental extraction.  This was carried out by Dr. Cindra Eves on Feb 08, 2011.  LABORATORY DATA:  Date Feb 08, 2011, sodium 135, potassium 4.4, chloride 96, CO2 of 30, glucose 110, BUN 12, creatinine 1.10, white blood cell count 4.1, hemoglobin 13.1, hematocrit 42.3, platelet count 154,000. Sputum culture obtained on Feb 05, 2011 demonstrated normal oropharyngeal-type flora.  RADIOLOGY:  Chest x-ray Feb 08, 2011 demonstrated no acute cardiopulmonary disease.  Orthopantogram obtained on Feb 03, 2011 noted a lucency around the routes of the posterior most left lower tooth with findings consistent with periapical tooth abscess.  A CT of the neck with contrast on Feb 03, 2011 negative for acute findings.  BRIEF HISTORY:  This is a 45 year old patient, followed by Dr. Marcelyn Bruins, in the outpatient setting with a known history of obesity, obesity-related hypoventilation syndrome, obstructive sleep apnea, and diastolic heart failure.  Her current weight was 321.6 pounds on hospital admit with baseline weight of 309 pounds.  She presented with a chief complaint of  12-pound weight gain since last visit, cough with discolored mucus, and left jaw swelling and pain with  progressive dyspnea and underlying multiple comorbidities.  She was admitted for further evaluation and therapy.  HOSPITAL COURSE BY DISCHARGE DIAGNOSES: 1. Acute-on-chronic respiratory failure secondary to decompensated     biventricular heart failure/diastolic dysfunction and resultant     pulmonary edema/volume overload.  [name] was admitted to the     regular medical ward.  Therapeutic interventions included     supplemental oxygen and aggressive diuretic regimen.  Ultimately,     she was changed from furosemide to [facility], and she had an     excellent response to diuretics with her initial weight recorded on     Feb 03, 2011 at 144.5 kg, now down to 138.12 kg on discharge day.     [name] has had an excellent response to current therapy and     has now reached maximum benefit from inpatient stay.  From     pulmonary standpoint, she has had her diuretic regimen adjusted.     She will be discharged to home on Demadex instead of furosemide.     Additionally, she will continue supplemental oxygen and continue     her regular pulmonary medication regimen (see below). 2. Obesity hypoventilation syndrome, as well as obstructive sleep     apnea in the setting of morbid obesity.  [name] as  previously     mentioned was recently diagnosed with obstructive sleep apnea,     following an auto-titration study and polysomnogram.  She will be     discharged to home with instructions to continue supplemental     oxygen at 2 liters at all times, additionally she is being set up     for nocturnal auto-titration BiPAP device by our office. 3. Chronic obstructive pulmonary disease.  For this, she will continue     supplemental oxygen as well as her maintenance bronchodilator     regimen. 4. Dental abscess.  She is now status post extraction by Dr. Cindra Eves.  She is having some mild postoperative pain.  We will     give her a prescription for oxycodone for this.  She will  continue     5 more days of empiric Augmentin.  She has already had 5 days of     Zosyn in the inpatient setting.  This will complete a total of 10     days therapy. 5. Obesity.  For this, we have set up home health consultation for     dietary counseling.  She will be followed by home health in the     outpatient setting for RN services and occupational services.  DISCHARGE MEDICATIONS: 1. Augmentin 875 one tablet q.12h. for 5 days. 2. Tylenol 650 mg by mouth every 6 hours as needed for pain. 3. Biotene oral rinse 50 mL twice a day. 4. Torsemide/Demadex 100 mg twice a day with meals. 5. Oxygen 2 liters all times. 6. Oxycodone/acetaminophen 5/325 one tablet every 8 hours as needed     for pain. 7. Albuterol nebulizer every 4 to 6 hours as needed. 8. Aleve 220 mg 2 tablets by mouth twice a day as needed. 9. Cetirizine 10 mg by mouth daily at bedtime. 10.Norvasc 5 mg daily. 11.Omeprazole 40 mg daily. 12.Potassium chloride 20 mEq twice a day. 13.Spiriva 18 mcg cap inhaled daily. 14.Symbicort 160/4.5 two puffs twice a day. 15.Ventolin inhaler 2 puffs every 4 hours as needed. 16.Xanax 0.5 mg twice a day as needed.  DISCHARGE INSTRUCTIONS:  Diet as tolerated.  DISCHARGE ACTIVITY:  Increase as tolerated.  FOLLOWUP:  She will follow up with Dr. Marcelyn Bruins on February 24, 2011. Also she has been instructed to call Dr. Luretha Murphy office for the dental followup.  DISPOSITION:  Ms. Mcquitty has met maximum benefits from inpatient care. She is now medically cleared for discharge.  Over 30 minutes of time dedicated to discharge assessment and planning.     Zenia Resides, NP   ______________________________ Barbaraann Share, MD,FCCP    PB/MEDQ  D:  02/08/2011  T:  02/08/2011  Job:  [id number]  cc:   Cindra Eves, D.D.S. Fax: [phone]  Electronically Signed by Zenia Resides NP on 02/13/2011 03:43:33 PM Electronically Signed by Marcelyn Bruins MDFCCP on 03/01/2011 01:04:27 PM

## 2011-03-02 NOTE — Telephone Encounter (Signed)
lmtcb with [email or web address] 

## 2011-03-10 NOTE — Telephone Encounter (Signed)
Pt aware we are waiting on orders from dr clance for the liquid 02

## 2011-03-15 ENCOUNTER — Other Ambulatory Visit: Payer: Self-pay

## 2011-03-15 ENCOUNTER — Inpatient Hospital Stay: Payer: Medicare Other | Admitting: Pulmonary Disease

## 2011-03-15 MED ORDER — ALPRAZOLAM 0.5 MG PO TABS: 0.5000 mg | ORAL_TABLET | Freq: Two times a day (BID) | ORAL | Status: DC

## 2011-03-16 NOTE — Telephone Encounter (Signed)
Pt advised via VM, Rx faxed to pharmacy

## 2011-03-22 ENCOUNTER — Other Ambulatory Visit: Payer: Self-pay

## 2011-03-22 ENCOUNTER — Telehealth: Payer: Self-pay | Admitting: Pulmonary Disease

## 2011-03-22 DIAGNOSIS — G8929 Other chronic pain: Secondary | ICD-10-CM

## 2011-03-22 MED ORDER — OXYCODONE-ACETAMINOPHEN 5-325 MG PO TABS: 1.0000 | ORAL_TABLET | Freq: Two times a day (BID) | ORAL | Status: DC | PRN

## 2011-03-22 NOTE — Telephone Encounter (Signed)
LMOMTCB to clarify this message.

## 2011-03-22 NOTE — Telephone Encounter (Signed)
I am not ordering anything until she comes in for ov.  She has cancelled or no showed the last 2-3 apptms. Need to see her before i do anything.

## 2011-03-22 NOTE — Telephone Encounter (Signed)
LECRETIA FROM [facility] CALLED.

## 2011-03-22 NOTE — Telephone Encounter (Signed)
Called, spoke with [place].  States pt was evaluated on liquid o2 and qualified for this.  Requesting order for liquid o2.  KC, pls advise.    Note: Per [place], she gave these results to Alida.

## 2011-03-22 NOTE — Telephone Encounter (Signed)
LMOMTCB to inform [name] of this.

## 2011-03-23 NOTE — Telephone Encounter (Signed)
Pt informed, Rx in cabinet for pt pick up  

## 2011-03-23 NOTE — Telephone Encounter (Signed)
Lecretia aware. Carron Curie, CMA

## 2011-03-27 ENCOUNTER — Other Ambulatory Visit: Payer: Self-pay | Admitting: Internal Medicine

## 2011-03-30 ENCOUNTER — Ambulatory Visit: Payer: Medicare Other | Admitting: Internal Medicine

## 2011-03-31 ENCOUNTER — Encounter: Payer: Self-pay | Admitting: Pulmonary Disease

## 2011-04-10 ENCOUNTER — Ambulatory Visit (INDEPENDENT_AMBULATORY_CARE_PROVIDER_SITE_OTHER): Payer: Medicare Other | Admitting: Internal Medicine

## 2011-04-10 ENCOUNTER — Encounter: Payer: Self-pay | Admitting: Internal Medicine

## 2011-04-10 DIAGNOSIS — G5601 Carpal tunnel syndrome, right upper limb: Secondary | ICD-10-CM

## 2011-04-10 DIAGNOSIS — G5600 Carpal tunnel syndrome, unspecified upper limb: Secondary | ICD-10-CM

## 2011-04-10 DIAGNOSIS — I5032 Chronic diastolic (congestive) heart failure: Secondary | ICD-10-CM

## 2011-04-10 DIAGNOSIS — I509 Heart failure, unspecified: Secondary | ICD-10-CM

## 2011-04-10 MED ORDER — ALBUTEROL SULFATE HFA 108 (90 BASE) MCG/ACT IN AERS: 2.0000 | INHALATION_SPRAY | Freq: Four times a day (QID) | RESPIRATORY_TRACT | Status: DC | PRN

## 2011-04-10 NOTE — Patient Instructions (Signed)
It was good to see you today. Medications reviewed, no changes at this time. we'll make referral to nerve conduction study to look for carpal tunnel syndrome. Our office will contact you regarding appointment(s) once made. Until then, wear wrist splint at night as dicussed to minimize pain symptoms  Call for information on bariatric seminars as reviewed today - number provided Please schedule followup in 3-4 months, call sooner if problems.

## 2011-04-10 NOTE — Progress Notes (Signed)
Subjective:    [name], female    DOB: 02-Feb-1966, 45 y.o.   MRN: [id number]  HPI   Here for follow up - reviewed chronic medical issues:  chronic hypoxic resp failure - home O2 dep since 2010 - overlap with OSA/OHS and obst dz per pulm - reports compliance with ongoing medical treatment and home O2 and no changes in medication dose or frequency. denies adverse side effects related to current therapy.  chronic dyspnea and congestion sensation improved since 01/2011 hosp for diuresis- Quit smoking 08/2010!!  HTN - reports compliance with ongoing medical treatment and no changes in medication dose or frequency. denies adverse side effects related to current therapy - prior rash from dilt resolved with change back to amlodipine  dCHF, chronic with acute exac requiring hosp for diuresis 01/2011 - IP echo 01/2011 reviewed - reports compliance with ongoing medical treatment and no changes in medication dose or frequency. denies adverse side effects related to current therapy. improved edema and dyspnea  - continues bid diuretic - eval by cards for same 02/2011 - to limit fluids <2L/d  morbid obesity - weights reviewed -  anxiety - chronic hx same with daily BZ use - changed from klonopin to xanax 07/2010 due to "too sleepy"   GERD - reports compliance with ongoing medical treatment and no changes in medication dose or frequency. denies adverse side effects related to current therapy.   dyslipidemia -started on simva spring 2011- reports intermittent compliance with ongoing medical treatment and no changes in medication dose or frequency. denies adverse side effects related to current therapy.   fibromyalgia - chronic pain - on daily narcotics for years - would like to reduce dose if possible (reports on "10mg " while in [place] - now on "5" - but note prev hydrocod, now oxy) - pain affects chest, back, legs and "whole body" - resumed flexeril  Also complains of right hand pain in thumb  and middle finger - Pain worst driving position, does not awaken from sleep Pain is described as tingling, no weakness  Past Medical History  Diagnosis Date  . Sleep apnea     noncompliant w/ CPAP  . COPD (chronic obstructive pulmonary disease)     moderate airflow obstruction, suspect d/t emphysema  . Hypertension   . Hyperlipidemia   . Chronic headache   . Fibromyalgia     daily narcotics  . Anxiety     hx chronic BZ use, stopped 07/2010  . Anemia   . Pulmonary sarcoidosis     unimpressive CT chest 2011  . Colonic polyp   . GERD (gastroesophageal reflux disease)   . ALLERGIC RHINITIS   . Asthma   . CHF (congestive heart failure)     Diastolic with fluid overload, May, 2012  . Ejection fraction     EF 60%, echo, May, 2012    Review of Systems  Constitutional: Negative for fever.  Respiratory: Negative for cough.   Neurological: Negative for headaches.       Objective:   Physical Exam  Constitutional: She is oriented to person, place, and time. She appears well-developed and well-nourished.       Morbidly obese.  Cardiovascular: Normal rate, regular rhythm and normal heart sounds.        BLE with 1+ edema  Pulmonary/Chest: Breath sounds normal. No respiratory distress. She has no wheezes.  Musculoskeletal:       No arthritis changes or swelling in right hand - FROM and normal grip,  full ext/flex without pain  Neurological: She is alert and oriented to person, place, and time. Coordination normal.  Psychiatric: She has a normal mood and affect. Her behavior is normal.   BP 112/68  Pulse 99  Temp(Src) 98 F (36.7 C) (Oral)  Ht 5' 8.5" (1.74 m)  Wt 301 lb (136.533 kg)  BMI 45.10 kg/m2  SpO2 95% Wt Readings from Last 3 Encounters:  04/10/11 301 lb (136.533 kg)  02/21/11 296 lb 6.4 oz (134.446 kg)  02/17/11 297 lb (134.718 kg)   Lab Results  Component Value Date   WBC 4.1 02/08/2011   HGB 13.1 02/08/2011   HCT 42.3 02/08/2011   PLT 154 02/08/2011   CHOL   Value: 189        ATP III CLASSIFICATION:  <200     mg/dL   Desirable  782-956  mg/dL   Borderline High  >=213    mg/dL   High        0/86/5784   TRIG 89 03/07/2010   HDL 79 03/07/2010   ALT 38* 02/02/2011   AST 31 02/02/2011   NA 135 02/08/2011   K 4.4 02/08/2011   CL 96 02/08/2011   CREATININE 1.10 02/08/2011   BUN 12 02/08/2011   CO2 30 02/08/2011   TSH 1.279 02/02/2011        Assessment & Plan:  See problem list. Medications and labs reviewed today. R hand pain - suspect CTS - wrist splint for qhs and refer for PNCS Time spent with pt 25 minutes, greater than 50% time spent counseling patient on dCHF, FM, OSA and medication review. Also review of prior records including cards consultation 02/2011

## 2011-04-10 NOTE — Assessment & Plan Note (Signed)
Pt reports interest in lap band - to follow up with bariatric meeting seminars - encouraged to work on diet and exertion efforts as tolerated until then  Wt Readings from Last 3 Encounters:  04/10/11 301 lb (136.533 kg)  02/21/11 296 lb 6.4 oz (134.446 kg)  02/17/11 297 lb (134.718 kg)

## 2011-04-10 NOTE — Assessment & Plan Note (Signed)
S/p 01/2011 hosp for diuresis - continue torsemide bid and reducing steroid dose as tolerated s/p Le[facility] cards OP consult 02/2011 for assistance with co-mgmt - to keep fluid intake <2L/d Dyspnea improved, weight overall stable Reports upcoming sleep study - ?need BiPAP - defer to Clance (pulm)

## 2011-04-14 ENCOUNTER — Encounter: Payer: Self-pay | Admitting: Pulmonary Disease

## 2011-04-14 ENCOUNTER — Other Ambulatory Visit (INDEPENDENT_AMBULATORY_CARE_PROVIDER_SITE_OTHER): Payer: Medicare Other

## 2011-04-14 ENCOUNTER — Telehealth: Payer: Self-pay | Admitting: Pulmonary Disease

## 2011-04-14 ENCOUNTER — Ambulatory Visit (INDEPENDENT_AMBULATORY_CARE_PROVIDER_SITE_OTHER): Payer: Medicare Other | Admitting: Pulmonary Disease

## 2011-04-14 DIAGNOSIS — I5032 Chronic diastolic (congestive) heart failure: Secondary | ICD-10-CM

## 2011-04-14 DIAGNOSIS — J449 Chronic obstructive pulmonary disease, unspecified: Secondary | ICD-10-CM

## 2011-04-14 DIAGNOSIS — J9610 Chronic respiratory failure, unspecified whether with hypoxia or hypercapnia: Secondary | ICD-10-CM

## 2011-04-14 DIAGNOSIS — E662 Morbid (severe) obesity with alveolar hypoventilation: Secondary | ICD-10-CM

## 2011-04-14 DIAGNOSIS — I509 Heart failure, unspecified: Secondary | ICD-10-CM

## 2011-04-14 LAB — BASIC METABOLIC PANEL
BUN: 10 mg/dL (ref 6–23)
Creatinine, Ser: 1 mg/dL (ref 0.4–1.2)
GFR: 81.13 mL/min (ref >60.00)
Glucose, Bld: 95 mg/dL (ref 70–99)
Potassium: 3.2 mEq/L — ABNORMAL LOW (ref 3.5–5.1)

## 2011-04-14 MED ORDER — POTASSIUM CHLORIDE CRYS ER 20 MEQ PO TBCR: EXTENDED_RELEASE_TABLET | ORAL | Status: DC

## 2011-04-14 NOTE — Assessment & Plan Note (Signed)
The pt was diuresed aggressively while in the hospital, and appears to have good control currently.  Her weight is down 25 pounds from her pre-hospital weight.  Suspect she has an element of right heart failure as well.

## 2011-04-14 NOTE — Patient Instructions (Addendum)
Will check bloodwork today to make sure your potassium and kidney functions are ok. Stay on oxygen, and also bipap at night Work on weight loss.  This is the key to getting better.  followup with me in 4months.

## 2011-04-14 NOTE — Progress Notes (Signed)
  Subjective:    [name], female    DOB: 03-02-1966, 45 y.o.   MRN: [id number]  HPI The pt comes in today for her post-hospital f/u, abouth 2mos late.  She never f/u with dental medicine after her abscess/tooth extraction, and was recently in the hospital for OHS with biventricular failure decompensation.  She was diuresed aggressively with significant weight loss.  Her weight is down 25 pounds from her pre-hospital weight.  She denies any worsening breathing issues, no cough or mucus.  She has been keeping her edema under control, and has been wearing bipap compliantly per her history.  Minimal mucus that is sometimes discolored.     Review of Systems  Constitutional: Negative for fever and unexpected weight change.  HENT: Positive for congestion, rhinorrhea and postnasal drip. Negative for ear pain, nosebleeds, sore throat, sneezing, trouble swallowing, dental problem and sinus pressure.   Eyes: Negative for redness and itching.  Respiratory: Positive for cough and shortness of breath. Negative for chest tightness and wheezing.   Cardiovascular: Negative for palpitations and leg swelling.  Gastrointestinal: Negative for nausea and vomiting.  Genitourinary: Negative for dysuria.  Musculoskeletal: Negative for joint swelling.  Skin: Negative for rash.  Neurological: Negative for headaches.  Hematological: Does not bruise/bleed easily.  Psychiatric/Behavioral: Positive for dysphoric mood. The patient is nervous/anxious.        Objective:   Physical Exam Obese female in nad No skin breakdown or pressure necrosis from cpap mask.  No discharge or purulence noted. Chest with decreased bs, no wheezing or rhonchi Cor with rrr LE with mild edema, no cyanosis  Alert, oriented, moves all 4        Assessment & Plan:

## 2011-04-14 NOTE — Assessment & Plan Note (Signed)
She needs to continue with bilevel, and work aggressively on weight loss.

## 2011-04-14 NOTE — Telephone Encounter (Signed)
Pt seen earlier today by Dr. Shelle Iron and had BMET ordered.  This showed low potassium.  Pt states she has been taking 20 meq potassium bid.  Advised her to increase dose to 40 meq bid, and she will need to have f/u BMET in two weeks.  Will forward message to Dr. Shelle Iron for review.

## 2011-04-14 NOTE — Assessment & Plan Note (Signed)
The pt is staying on her copd meds, and has not had an acute exacerbation since her hospitalization.

## 2011-04-15 ENCOUNTER — Encounter: Payer: Self-pay | Admitting: Pulmonary Disease

## 2011-04-26 ENCOUNTER — Telehealth: Payer: Self-pay | Admitting: Internal Medicine

## 2011-04-26 DIAGNOSIS — G8929 Other chronic pain: Secondary | ICD-10-CM

## 2011-04-26 MED ORDER — OXYCODONE-ACETAMINOPHEN 5-325 MG PO TABS: 1.0000 | ORAL_TABLET | Freq: Two times a day (BID) | ORAL | Status: DC | PRN

## 2011-04-26 NOTE — Telephone Encounter (Signed)
Done - print, sign - on triage b desk

## 2011-04-26 NOTE — Telephone Encounter (Signed)
REQUESTING REFILL ON OXYCODONE.

## 2011-04-27 NOTE — Telephone Encounter (Signed)
Pt informed, Rx in cabinet for pt pick up  

## 2011-05-10 ENCOUNTER — Emergency Department (HOSPITAL_COMMUNITY)
Admission: EM | Admit: 2011-05-10 | Discharge: 2011-05-11 | Disposition: A | Discharge status: Home / Self Care | Payer: Medicare Other | Attending: Emergency Medicine | Admitting: Emergency Medicine

## 2011-05-10 DIAGNOSIS — J40 Bronchitis, not specified as acute or chronic: Secondary | ICD-10-CM | POA: Insufficient documentation

## 2011-05-10 DIAGNOSIS — M1990 Unspecified osteoarthritis, unspecified site: Secondary | ICD-10-CM | POA: Insufficient documentation

## 2011-05-10 DIAGNOSIS — M549 Dorsalgia, unspecified: Secondary | ICD-10-CM | POA: Insufficient documentation

## 2011-05-10 DIAGNOSIS — Z79899 Other long term (current) drug therapy: Secondary | ICD-10-CM | POA: Insufficient documentation

## 2011-05-10 DIAGNOSIS — IMO0001 Reserved for inherently not codable concepts without codable children: Secondary | ICD-10-CM | POA: Insufficient documentation

## 2011-05-10 DIAGNOSIS — R05 Cough: Secondary | ICD-10-CM | POA: Insufficient documentation

## 2011-05-10 DIAGNOSIS — J438 Other emphysema: Secondary | ICD-10-CM | POA: Insufficient documentation

## 2011-05-10 DIAGNOSIS — R079 Chest pain, unspecified: Secondary | ICD-10-CM | POA: Insufficient documentation

## 2011-05-10 DIAGNOSIS — F341 Dysthymic disorder: Secondary | ICD-10-CM | POA: Insufficient documentation

## 2011-05-10 DIAGNOSIS — I10 Essential (primary) hypertension: Secondary | ICD-10-CM | POA: Insufficient documentation

## 2011-05-10 DIAGNOSIS — R059 Cough, unspecified: Secondary | ICD-10-CM | POA: Insufficient documentation

## 2011-05-11 ENCOUNTER — Emergency Department (HOSPITAL_COMMUNITY): Payer: Medicare Other

## 2011-05-11 ENCOUNTER — Other Ambulatory Visit: Payer: Self-pay | Admitting: Internal Medicine

## 2011-05-11 LAB — CBC
HCT: 43.6 % (ref 36.0–46.0)
MCHC: 33.3 g/dL (ref 30.0–36.0)
RDW: 17.2 % — ABNORMAL HIGH (ref 11.5–15.5)

## 2011-05-11 LAB — COMPREHENSIVE METABOLIC PANEL
Albumin: 3.4 g/dL — ABNORMAL LOW (ref 3.5–5.2)
Alkaline Phosphatase: 144 U/L — ABNORMAL HIGH (ref 39–117)
BUN: 13 mg/dL (ref 6–23)
Creatinine, Ser: 0.78 mg/dL (ref 0.50–1.10)
Potassium: 3.5 mEq/L (ref 3.5–5.1)
Total Protein: 7.8 g/dL (ref 6.0–8.3)

## 2011-05-11 LAB — TROPONIN I: Troponin I: <0.3 ng/mL (ref <0.30)

## 2011-05-11 LAB — CK TOTAL AND CKMB (NOT AT ARMC)
CK, MB: 1.8 ng/mL (ref 0.3–4.0)
Total CK: 80 U/L (ref 7–177)

## 2011-05-12 NOTE — Telephone Encounter (Signed)
Faxed script back to [facility] @ [phone].Marland KitchenMarland Kitchen8/17/12@1 :22pm/LMB

## 2011-05-24 ENCOUNTER — Other Ambulatory Visit (INDEPENDENT_AMBULATORY_CARE_PROVIDER_SITE_OTHER): Payer: Medicare Other

## 2011-05-24 ENCOUNTER — Encounter: Payer: Self-pay | Admitting: Internal Medicine

## 2011-05-24 ENCOUNTER — Ambulatory Visit (INDEPENDENT_AMBULATORY_CARE_PROVIDER_SITE_OTHER): Payer: Medicare Other | Admitting: Internal Medicine

## 2011-05-24 VITALS — BP 118/72 | HR 89 | Temp 98.3°F | Ht 68.0 in | Wt 307.0 lb

## 2011-05-24 DIAGNOSIS — I5033 Acute on chronic diastolic (congestive) heart failure: Secondary | ICD-10-CM

## 2011-05-24 DIAGNOSIS — F4320 Adjustment disorder, unspecified: Secondary | ICD-10-CM

## 2011-05-24 DIAGNOSIS — J9610 Chronic respiratory failure, unspecified whether with hypoxia or hypercapnia: Secondary | ICD-10-CM

## 2011-05-24 DIAGNOSIS — F4321 Adjustment disorder with depressed mood: Secondary | ICD-10-CM

## 2011-05-24 DIAGNOSIS — G8929 Other chronic pain: Secondary | ICD-10-CM

## 2011-05-24 DIAGNOSIS — I5032 Chronic diastolic (congestive) heart failure: Secondary | ICD-10-CM

## 2011-05-24 DIAGNOSIS — I509 Heart failure, unspecified: Secondary | ICD-10-CM

## 2011-05-24 LAB — BASIC METABOLIC PANEL
BUN: 11 mg/dL (ref 6–23)
Calcium: 8.9 mg/dL (ref 8.4–10.5)
GFR: 90.84 mL/min (ref >60.00)
Glucose, Bld: 77 mg/dL (ref 70–99)
Potassium: 3.7 mEq/L (ref 3.5–5.1)

## 2011-05-24 MED ORDER — POTASSIUM CHLORIDE CRYS ER 20 MEQ PO TBCR: EXTENDED_RELEASE_TABLET | ORAL | Status: DC

## 2011-05-24 MED ORDER — FLUTICASONE PROPIONATE 50 MCG/ACT NA SUSP: 2.0000 | NASAL | Status: DC

## 2011-05-24 MED ORDER — POTASSIUM CHLORIDE CRYS ER 20 MEQ PO TBCR: 40.0000 meq | EXTENDED_RELEASE_TABLET | Freq: Three times a day (TID) | ORAL | Status: DC

## 2011-05-24 MED ORDER — OXYCODONE-ACETAMINOPHEN 5-325 MG PO TABS: 1.0000 | ORAL_TABLET | Freq: Two times a day (BID) | ORAL | Status: DC | PRN

## 2011-05-24 MED ORDER — METOLAZONE 5 MG PO TABS: 5.0000 mg | ORAL_TABLET | Freq: Every day | ORAL | Status: DC

## 2011-05-24 NOTE — Progress Notes (Signed)
Subjective:    Patient ID: [name], female    DOB: 08-04-1966, 45 y.o.   MRN: [id number]  HPI Here for shortness of breath  Onset 2 weeks ago -  Progressively worse symptoms - dyspnea on exertion as well as rest associated with pleurisy R>L posterior lungs No fever or cough No recent med/duiretic dose changes  Also reviewed chronic medical issues:  chronic hypoxic resp failure - home O2 dep since 2010 - overlap with OSA/OHS and obst dz per pulm - BiPAP qhs - reports compliance with ongoing medical treatment and home O2 and no changes in medication dose or frequency. denies adverse side effects related to current therapy. Quit smoking 08/2010!!  HTN - reports compliance with ongoing medical treatment and no changes in medication dose or frequency. denies adverse side effects related to current therapy - prior rash from dilt resolved with change back to amlodipine  dCHF, chronic with acute exac requiring hosp for diuresis 01/2011 - IP echo 01/2011 reviewed - reports compliance with ongoing medical treatment and no changes in medication dose or frequency. denies adverse side effects related to current therapy. improved edema and dyspnea  - continues bid diuretic - eval by cards for same 02/2011 - to limit fluids <2L/d  morbid obesity - weights reviewed - often has fluid related weight variance but remains obese Wt Readings from Last 3 Encounters:  05/24/11 307 lb (139.254 kg)  04/14/11 296 lb 3.2 oz (134.355 kg)  04/10/11 301 lb (136.533 kg)    anxiety - chronic hx same with daily BZ use - changed from klonopin to xanax 07/2010 due to "too sleepy"   GERD - reports compliance with ongoing medical treatment and no changes in medication dose or frequency. denies adverse side effects related to current therapy.   dyslipidemia -started on simva spring 2011- reports intermittent compliance with ongoing medical treatment and no changes in medication dose or frequency. denies adverse side  effects related to current therapy.   fibromyalgia - chronic pain - on daily narcotics for years - would like to reduce dose if possible (reports on "10mg " while in [place] - now on "5" - but note prev hydrocod, now oxy) - pain affects chest, back, legs and "whole body" - resumed flexeril    Past Medical History  Diagnosis Date  . Sleep apnea     noncompliant w/ CPAP  . COPD (chronic obstructive pulmonary disease)     moderate airflow obstruction, suspect d/t emphysema  . Hypertension   . Hyperlipidemia   . Chronic headache   . Fibromyalgia     daily narcotics  . Anxiety     hx chronic BZ use, stopped 07/2010  . Anemia   . Pulmonary sarcoidosis     unimpressive CT chest 2011  . Colonic polyp   . GERD (gastroesophageal reflux disease)   . ALLERGIC RHINITIS   . Asthma   . CHF (congestive heart failure)     Diastolic with fluid overload, May, 2012  . Ejection fraction     EF 60%, echo, May, 2012    Review of Systems  Constitutional: Negative for fever.  Respiratory: Negative for cough.   Neurological: Negative for headaches.       Objective:   Physical Exam  Constitutional: She is oriented to person, place, and time. She appears well-developed and well-nourished.       Morbidly obese.  Cardiovascular: Normal rate, regular rhythm and normal heart sounds.        BLE with 1+  edema  Pulmonary/Chest: Breath sounds normal. No respiratory distress. She has no wheezes.  Neurological: She is alert and oriented to person, place, and time. Coordination normal.  Psychiatric: She has a normal mood and affect. Her behavior is normal.   BP 118/72  Pulse 89  Temp(Src) 98.3 F (36.8 C) (Oral)  Ht 5\' 8"  (1.727 m)  Wt 307 lb (139.254 kg)  BMI 46.68 kg/m2  SpO2 93% Wt Readings from Last 3 Encounters:  05/24/11 307 lb (139.254 kg)  04/14/11 296 lb 3.2 oz (134.355 kg)  04/10/11 301 lb (136.533 kg)   Lab Results  Component Value Date   WBC 8.2 05/10/2011   HGB 14.5 05/10/2011   HCT  43.6 05/10/2011   PLT 202 05/10/2011   CHOL  Value: 189        ATP III CLASSIFICATION:  <200     mg/dL   Desirable  409-811  mg/dL   Borderline High  >=914    mg/dL   High        7/82/9562   TRIG 89 03/07/2010   HDL 79 03/07/2010   ALT 31 10/25/8655   AST 26 05/10/2011   NA 138 05/10/2011   K 3.5 05/10/2011   CL 98 05/10/2011   CREATININE 0.78 05/10/2011   BUN 13 05/10/2011   CO2 30 05/10/2011   TSH 1.279 02/02/2011        Assessment & Plan:  See problem list. Medications and labs reviewed today.  Acute on chronic diastolic HF - switched demadex from fureosmide in 01/2011 - weight increase likely indicates vol overload with increase in dyspnea symptoms - add zaroxlyn 5 qd x 1 week, continue tid Kcl and check labs now + 1 week f/u OV for weight/O2 and lab recheck - pt will call sooner if problems - no other med changes  Depression/acute grief reaction - boyfriend unexpectedly passed 1 week agp - refer to counseling, support offered, declines BZ or other "pills"   Time spent with pt 25 minutes, greater than 50% time spent counseling patient on dCHF, OSA, grief and medication review. Also review of prior records including cards consultation 02/2011

## 2011-05-24 NOTE — Assessment & Plan Note (Signed)
S/p 01/2011 hosp for diuresis - changed lasix to max dose torsemide bid and reduced steroid dose as tolerated s/p LeB cards OP consult 02/2011 for assistance with co-mgmt - to keep fluid intake <2L/d Dyspnea exac x 2 weeks as above; weight increasing Increase diuretics - erx done continue BiPAP as per Clance (pulm)

## 2011-05-24 NOTE — Patient Instructions (Signed)
It was good to see you today. Add zaroxlyn 5mg  once daily to current max dose demadex - continue potassium 3x/day as directed - Your prescription(s) have been submitted to your pharmacy. Please take as directed and contact our office if you believe you are having problem(s) with the medication(s). Test(s) ordered today. Your results will be called to you after review (48-72hours after test completion). If any changes need to be made, you will be notified at that time. we'll make referral to counseling as discussed. Our office will contact you regarding appointment(s) once made. Please schedule followup in 1 week for weight, O2 and lab check, call sooner if problems.

## 2011-05-25 ENCOUNTER — Telehealth: Payer: Self-pay | Admitting: *Deleted

## 2011-05-25 DIAGNOSIS — I5033 Acute on chronic diastolic (congestive) heart failure: Secondary | ICD-10-CM

## 2011-05-25 NOTE — Telephone Encounter (Signed)
Called pt concerning lab results pt states she is having bad muscle & cramping in her legs & arms. ? Med that was rx yesterday is too strong for her. Requesting advise on what she need to do as of this am she did not take. Pls advise.Marland KitchenMarland Kitchen8/30/12@12 :11pm/LMB

## 2011-05-25 NOTE — Telephone Encounter (Signed)
Try taking 1/2 of zaroxolyn tab (2.5 mg daily) x 7 days instead of whole 5mg  tab -

## 2011-05-25 NOTE — Telephone Encounter (Signed)
Notified pt with md response.Marland KitchenMarland Kitchen8/30/12!@1 :36pm/LMB

## 2011-05-26 ENCOUNTER — Other Ambulatory Visit: Payer: Self-pay | Admitting: Internal Medicine

## 2011-05-26 ENCOUNTER — Emergency Department (HOSPITAL_COMMUNITY)
Admission: EM | Admit: 2011-05-26 | Discharge: 2011-05-27 | Disposition: A | Discharge status: Home / Self Care | Payer: Medicare Other | Attending: Emergency Medicine | Admitting: Emergency Medicine

## 2011-05-26 ENCOUNTER — Emergency Department (HOSPITAL_COMMUNITY): Payer: Medicare Other

## 2011-05-26 ENCOUNTER — Ambulatory Visit: Payer: Medicare Other | Admitting: Internal Medicine

## 2011-05-26 DIAGNOSIS — I10 Essential (primary) hypertension: Secondary | ICD-10-CM | POA: Insufficient documentation

## 2011-05-26 DIAGNOSIS — J4489 Other specified chronic obstructive pulmonary disease: Secondary | ICD-10-CM | POA: Insufficient documentation

## 2011-05-26 DIAGNOSIS — E876 Hypokalemia: Secondary | ICD-10-CM | POA: Insufficient documentation

## 2011-05-26 DIAGNOSIS — IMO0001 Reserved for inherently not codable concepts without codable children: Secondary | ICD-10-CM | POA: Insufficient documentation

## 2011-05-26 DIAGNOSIS — R0609 Other forms of dyspnea: Secondary | ICD-10-CM | POA: Insufficient documentation

## 2011-05-26 DIAGNOSIS — D869 Sarcoidosis, unspecified: Secondary | ICD-10-CM | POA: Insufficient documentation

## 2011-05-26 DIAGNOSIS — R0989 Other specified symptoms and signs involving the circulatory and respiratory systems: Secondary | ICD-10-CM | POA: Insufficient documentation

## 2011-05-26 DIAGNOSIS — I498 Other specified cardiac arrhythmias: Secondary | ICD-10-CM | POA: Insufficient documentation

## 2011-05-26 DIAGNOSIS — J449 Chronic obstructive pulmonary disease, unspecified: Secondary | ICD-10-CM | POA: Insufficient documentation

## 2011-05-26 LAB — DIFFERENTIAL
Basophils Absolute: 0.1 K/uL (ref 0.0–0.1)
Basophils Relative: 1 % (ref 0–1)
Eosinophils Absolute: 0.2 10*3/uL (ref 0.0–0.7)
Eosinophils Relative: 2 % (ref 0–5)
Lymphocytes Relative: 18 % (ref 12–46)
Lymphs Abs: 1.7 10*3/uL (ref 0.7–4.0)
Monocytes Absolute: 1.1 K/uL — ABNORMAL HIGH (ref 0.1–1.0)
Monocytes Relative: 11 % (ref 3–12)
Neutro Abs: 6.8 K/uL (ref 1.7–7.7)
Neutrophils Relative %: 69 % (ref 43–77)

## 2011-05-26 LAB — BASIC METABOLIC PANEL WITH GFR
BUN: 21 mg/dL (ref 6–23)
CO2: 36 meq/L — ABNORMAL HIGH (ref 19–32)
GFR calc non Af Amer: 56 mL/min — ABNORMAL LOW (ref >60)
Glucose, Bld: 128 mg/dL — ABNORMAL HIGH (ref 70–99)
Potassium: 2.7 meq/L — CL (ref 3.5–5.1)
Sodium: 131 meq/L — ABNORMAL LOW (ref 135–145)

## 2011-05-26 LAB — CBC
HCT: 45.3 % (ref 36.0–46.0)
Hemoglobin: 15.8 g/dL — ABNORMAL HIGH (ref 12.0–15.0)
MCH: 26.3 pg (ref 26.0–34.0)
MCHC: 34.9 g/dL (ref 30.0–36.0)
MCV: 75.5 fL — ABNORMAL LOW (ref 78.0–100.0)
Platelets: 212 10*3/uL (ref 150–400)
RBC: 6 MIL/uL — ABNORMAL HIGH (ref 3.87–5.11)
RDW: 16.5 % — ABNORMAL HIGH (ref 11.5–15.5)
WBC: 9.8 10*3/uL (ref 4.0–10.5)

## 2011-05-26 LAB — BASIC METABOLIC PANEL
Calcium: 10 mg/dL (ref 8.4–10.5)
Chloride: 81 mEq/L — ABNORMAL LOW (ref 96–112)
Creatinine, Ser: 1.07 mg/dL (ref 0.50–1.10)
GFR calc Af Amer: >60 mL/min (ref >60)

## 2011-05-26 LAB — CK: Total CK: 141 U/L (ref 7–177)

## 2011-05-30 ENCOUNTER — Telehealth: Payer: Self-pay

## 2011-05-30 LAB — POCT I-STAT, CHEM 8
Calcium, Ion: 1.02 mmol/L — ABNORMAL LOW (ref 1.12–1.32)
Glucose, Bld: 172 mg/dL — ABNORMAL HIGH (ref 70–99)
HCT: 51 % — ABNORMAL HIGH (ref 36.0–46.0)
Hemoglobin: 17.3 g/dL — ABNORMAL HIGH (ref 12.0–15.0)
Potassium: 2.8 mEq/L — ABNORMAL LOW (ref 3.5–5.1)
TCO2: 37 mmol/L (ref 0–100)

## 2011-05-30 NOTE — Telephone Encounter (Signed)
[facility] Triage Call Report Triage Record Num: [id number] Operator: Audelia Hives Patient Name: [name] Call Date & Time: 05/27/2011 7:52:32PM Patient Phone: [phone] PCP: Rene Paci Patient Gender: Female PCP Fax : [phone] Patient DOB: 20-Sep-1966 [facility] Name[facility] Reason for Call: Tiffannie calling regarding low K was in the ED 05/26/11. States started having cramps to her feet/abd 05/26/11 and went to the ED and her K was 2.7, after fluids and IV K increased to 3.3 at d/c this am at 0600 05/27/11. Needs to know of she should continue her Torsemide 100 mg po BID states the ED MD did not say either way. Saw Dr. Felicity Coyer 05/24/11 and started a new med thats starts with a M and is a fluid pill that is 5 mg. States she took first dose 05/23/11 and stopped due to mild cramps to her feet. No cramps reported at this time, no chest pain/discomfort or swelling to extremities. Spoke wtih Dr. Milinda Cave and advised to continue the Torsemide as orderd, K as ED MD ordered 120 meq qd, recheck K levels 04/29/11 or 04/30/11, pt to call back if starts with s/s. Increase her foods/juices with K. Pt compliant. Protocol(s) Used: Office Note Recommended Outcome per Protocol: Information Noted and Sent to Office Reason for Outcome: Caller information to office Care Advice: ~ 05/27/2011 9:05:48PM Page 1 of 1 [email or web address]

## 2011-05-30 NOTE — Telephone Encounter (Signed)
Noted. thx 

## 2011-05-31 ENCOUNTER — Telehealth: Payer: Self-pay

## 2011-05-31 ENCOUNTER — Encounter: Payer: Self-pay | Admitting: Internal Medicine

## 2011-05-31 ENCOUNTER — Other Ambulatory Visit (INDEPENDENT_AMBULATORY_CARE_PROVIDER_SITE_OTHER): Payer: Medicare Other

## 2011-05-31 ENCOUNTER — Ambulatory Visit (INDEPENDENT_AMBULATORY_CARE_PROVIDER_SITE_OTHER): Payer: Medicare Other | Admitting: Internal Medicine

## 2011-05-31 DIAGNOSIS — E876 Hypokalemia: Secondary | ICD-10-CM

## 2011-05-31 DIAGNOSIS — I509 Heart failure, unspecified: Secondary | ICD-10-CM

## 2011-05-31 DIAGNOSIS — I5032 Chronic diastolic (congestive) heart failure: Secondary | ICD-10-CM

## 2011-05-31 MED ORDER — SPIRONOLACTONE 25 MG PO TABS: 25.0000 mg | ORAL_TABLET | Freq: Every day | ORAL | Status: DC

## 2011-05-31 MED ORDER — PROMETHAZINE HCL 25 MG PO TABS: 25.0000 mg | ORAL_TABLET | Freq: Four times a day (QID) | ORAL | Status: DC | PRN

## 2011-05-31 NOTE — Assessment & Plan Note (Signed)
S/p 01/2011 hosp for diuresis - changed lasix to max dose torsemide bid and reduced steroid dose as tolerated s/p LeB cards OP consult 02/2011 for assistance with co-mgmt - to keep fluid intake <2L/d Acute exac with increased dyspnea 2 weeks ago (04/2011) now improved; weight down Poor tolerance of zaroxlyn - dc same but continue max dose toresimde Add aldactone as potassium sparing and plan to reduce Kdur dosing if back to normal range continue BiPAP as per Clance (pulm)

## 2011-05-31 NOTE — Progress Notes (Signed)
Subjective:    [name], female    DOB: Jul 25, 1966, 45 y.o.   MRN: [id number]  HPI Here for follow up - shortness of breath  Seen 04/2011 for same and in ER since last OV Cramps with zaroxyln but weight down and dyspnea on exertion improved  Also reviewed chronic medical issues:  chronic hypoxic resp failure - home O2 dep since 2010 - overlap with OSA/OHS and obst dz per pulm - BiPAP qhs - reports compliance with ongoing medical treatment and home O2 and no changes in medication dose or frequency. denies adverse side effects related to current therapy. Quit smoking 08/2010!!  HTN - reports compliance with ongoing medical treatment and no changes in medication dose or frequency. denies adverse side effects related to current therapy - prior rash from dilt resolved with change back to amlodipine  dCHF, chronic with acute exac requiring hosp for diuresis 01/2011 - IP echo 01/2011 reviewed - reports compliance with ongoing medical treatment and no changes in medication dose or frequency. denies adverse side effects related to current therapy. improved edema and dyspnea  - continues bid diuretic - eval by cards for same 02/2011 - to limit fluids <2L/d  morbid obesity - weights reviewed - often has fluid related weight variance but remains obese Wt Readings from Last 3 Encounters:  05/31/11 302 lb 6.4 oz (137.168 kg)  05/24/11 307 lb (139.254 kg)  04/14/11 296 lb 3.2 oz (134.355 kg)    anxiety - chronic hx same with daily BZ use - changed from klonopin to xanax 07/2010 due to "too sleepy"   GERD - reports compliance with ongoing medical treatment and no changes in medication dose or frequency. denies adverse side effects related to current therapy.   dyslipidemia -started on simva spring 2011- reports intermittent compliance with ongoing medical treatment and no changes in medication dose or frequency. denies adverse side effects related to current therapy.   fibromyalgia -  chronic pain - on daily narcotics for years - would like to reduce dose if possible (reports on "10mg " while in [place] - now on "5" - but note prev hydrocod, now oxy) - pain affects chest, back, legs and "whole body" - resumed flexeril    Past Medical History  Diagnosis Date  . Sleep apnea     noncompliant w/ CPAP  . COPD (chronic obstructive pulmonary disease)     moderate airflow obstruction, suspect d/t emphysema  . Hypertension   . Hyperlipidemia   . Chronic headache   . Fibromyalgia     daily narcotics  . Anxiety     hx chronic BZ use, stopped 07/2010  . Anemia   . Pulmonary sarcoidosis     unimpressive CT chest 2011  . Colonic polyp   . GERD (gastroesophageal reflux disease)   . ALLERGIC RHINITIS   . Asthma   . CHF (congestive heart failure)     Diastolic with fluid overload, May, 2012  . Ejection fraction     EF 60%, echo, May, 2012    Review of Systems  Constitutional: Negative for fever.  Respiratory: Negative for cough.   Neurological: Negative for headaches.       Objective:   Physical Exam BP 110/82  Pulse 93  Temp(Src) 98 F (36.7 C) (Oral)  Ht 5' 8.5" (1.74 m)  Wt 302 lb 6.4 oz (137.168 kg)  BMI 45.31 kg/m2  SpO2 96% Wt Readings from Last 3 Encounters:  05/31/11 302 lb 6.4 oz (137.168 kg)  05/24/11  307 lb (139.254 kg)  04/14/11 296 lb 3.2 oz (134.355 kg)   Constitutional: She is oriented to person, place, and time. She appears well-developed and well-nourished. Morbidly obese.  Cardiovascular: Normal rate, regular rhythm and normal heart sounds.  BLE with trace edema, improved  Pulmonary/Chest: Breath sounds diminished bilateral bases but no respiratory distress. She has no wheezes.  Neurological: She is alert and oriented to person, place, and time. Coordination normal.  Psychiatric: She has a normal mood and affect. Her behavior is normal.    Lab Results  Component Value Date   WBC 9.8 05/26/2011   HGB 17.3* 05/26/2011   HCT 51.0* 05/26/2011    PLT 212 05/26/2011   CHOL  Value: 189        ATP III CLASSIFICATION:  <200     mg/dL   Desirable  846-962  mg/dL   Borderline High  >=952    mg/dL   High        8/41/3244   TRIG 89 03/07/2010   HDL 79 03/07/2010   ALT 31 0/06/2724   AST 26 05/10/2011   NA 130* 05/26/2011   K 3.3* 05/27/2011   CL 87* 05/26/2011   CREATININE 1.40* 05/26/2011   BUN 24* 05/26/2011   CO2 36* 05/26/2011   TSH 1.279 02/02/2011        Assessment & Plan:  See problem list. Medications and labs reviewed today.

## 2011-05-31 NOTE — Telephone Encounter (Signed)
Noted - promethazine erx done

## 2011-05-31 NOTE — Patient Instructions (Signed)
It was good to see you today. We have reviewed your ER records including labs and tests today Test(s) ordered today. Your results will be called to you after review (48-72hours after test completion). If any changes need to be made, you will be notified at that time. Will reduce Kdur to 1pill ( ) 2x.day and stop zaroxyln - also add spironolactone as mild fluid pill to help "hold on to" potassium Your prescription(s) have been submitted to your pharmacy. Please take as directed and contact our office if you believe you are having problem(s) with the medication(s). Please schedule followup in 3-4 weeks to monitor weight, breathing and potassium, call sooner if problems.

## 2011-05-31 NOTE — Telephone Encounter (Signed)
Pt called stating she forgot to mention to MD at OV that she has been experiencing nausea and is requesting Rx to treat.

## 2011-06-01 ENCOUNTER — Other Ambulatory Visit: Payer: Self-pay | Admitting: Internal Medicine

## 2011-06-01 DIAGNOSIS — E876 Hypokalemia: Secondary | ICD-10-CM

## 2011-06-01 NOTE — Telephone Encounter (Signed)
Pt advised via mobile VM 

## 2011-06-03 ENCOUNTER — Other Ambulatory Visit: Payer: Self-pay | Admitting: Pulmonary Disease

## 2011-06-03 DIAGNOSIS — G4733 Obstructive sleep apnea (adult) (pediatric): Secondary | ICD-10-CM

## 2011-06-07 ENCOUNTER — Telehealth: Payer: Self-pay | Admitting: Pulmonary Disease

## 2011-06-07 NOTE — Telephone Encounter (Signed)
ATC, NA and no option to leave a msg, WCB 

## 2011-06-07 NOTE — Telephone Encounter (Signed)
ATC pt NA and not able to leave VM [facility]

## 2011-06-08 NOTE — Telephone Encounter (Signed)
Pt states she ahs been having some issues with her breathing and wants ov with [facility]. Ov placed with [facility] tomorrow at 3:45. Carron Curie, CMA

## 2011-06-09 ENCOUNTER — Other Ambulatory Visit: Payer: Self-pay | Admitting: Internal Medicine

## 2011-06-09 ENCOUNTER — Ambulatory Visit (INDEPENDENT_AMBULATORY_CARE_PROVIDER_SITE_OTHER): Payer: Medicare Other | Admitting: Pulmonary Disease

## 2011-06-09 ENCOUNTER — Encounter: Payer: Self-pay | Admitting: Pulmonary Disease

## 2011-06-09 ENCOUNTER — Other Ambulatory Visit (INDEPENDENT_AMBULATORY_CARE_PROVIDER_SITE_OTHER): Payer: Medicare Other

## 2011-06-09 DIAGNOSIS — E662 Morbid (severe) obesity with alveolar hypoventilation: Secondary | ICD-10-CM

## 2011-06-09 DIAGNOSIS — J449 Chronic obstructive pulmonary disease, unspecified: Secondary | ICD-10-CM

## 2011-06-09 DIAGNOSIS — I509 Heart failure, unspecified: Secondary | ICD-10-CM

## 2011-06-09 DIAGNOSIS — G4733 Obstructive sleep apnea (adult) (pediatric): Secondary | ICD-10-CM

## 2011-06-09 DIAGNOSIS — J9610 Chronic respiratory failure, unspecified whether with hypoxia or hypercapnia: Secondary | ICD-10-CM

## 2011-06-09 DIAGNOSIS — I5032 Chronic diastolic (congestive) heart failure: Secondary | ICD-10-CM

## 2011-06-09 DIAGNOSIS — J209 Acute bronchitis, unspecified: Secondary | ICD-10-CM

## 2011-06-09 MED ORDER — CEFDINIR 300 MG PO CAPS: ORAL_CAPSULE | ORAL | Status: DC

## 2011-06-09 NOTE — Progress Notes (Signed)
  Subjective:    [name], female    DOB: 02/03/1966, 45 y.o.   MRN: [id number]  HPI The patient comes in today for an acute sick visit.  She has known obesity hypoventilation/sleep apnea, COPD, and significant diastolic heart failure.  She gives a history of worsening chest congestion with cough productive of purulent mucus.  She denies returning to smoking.  Unfortunately, she has gained 9 pounds since her last visit with her primary care physician downstairs.  She quit taking Aldactone that was prescribed, but denies noncompliance with her torsemide.  She also has not worn CPAP in the last 3-4 days because of a broken mask.  She tells me that her DME has told her they have to order her mask.   Review of Systems  Constitutional: Positive for unexpected weight change. Negative for fever.  HENT: Negative for ear pain, nosebleeds, congestion, sore throat, rhinorrhea, sneezing, trouble swallowing, dental problem, postnasal drip and sinus pressure.   Eyes: Negative for redness and itching.  Respiratory: Positive for cough, chest tightness, shortness of breath and wheezing.   Cardiovascular: Positive for leg swelling. Negative for palpitations.  Gastrointestinal: Positive for nausea. Negative for vomiting.  Genitourinary: Negative for dysuria.  Musculoskeletal: Negative for joint swelling.  Skin: Negative for rash.  Neurological: Negative for headaches.  Hematological: Does not bruise/bleed easily.  Psychiatric/Behavioral: Negative for dysphoric mood. The patient is not nervous/anxious.        Objective:   Physical Exam Obese female in no acute distress No skin breakdown or pressure necrosis from CPAP mask Chest with a few basilar crackles, no wheezes or rhonchi Cardiac exam with regular rate and rhythm Lower extremities with 1-2+ edema, no cyanosis Alert and oriented, does not appear sleepy, moves all 4 extremities.       Assessment & Plan:

## 2011-06-09 NOTE — Assessment & Plan Note (Signed)
The patient continues to have a lot of issues with her volume status.  Her weight has gone up 9 pounds since her visit down stairs, and the patient has not been compliant with Aldactone.  I also suspect she is not compliant from a dietary perspective.  I have asked her to start back on the Aldactone, have clarified her potassium dose, and we'll check her lab work today.  I also stressed to her the role that BiPAP plays in keeping her fluid status under control.

## 2011-06-09 NOTE — Assessment & Plan Note (Signed)
The patient gives a history today that is consistent with acute bronchitis.  We'll need to treat her with a course of antibiotics.

## 2011-06-09 NOTE — Patient Instructions (Addendum)
Stay on same dose of demedex (torsemide) Get back on spironolactone 25mg  one each am Change potassium back to ONE in am and pm Will send you downstairs for labwork today Please make an apptm to see Dr Felicity Coyer next week to check on your fluid status since restarting spironolactone Will treat you with omnicef 300mg  2 each am for 5 days for your bronchitis. Will get you a new cpap mask. followup with me in 3mos.

## 2011-06-09 NOTE — Assessment & Plan Note (Signed)
The patient has a broken CPAP mask, and compliance with her CPAP is key to her treatment regimen.  We'll contact her DME and arrange for her to get a new mask tonight.

## 2011-06-12 ENCOUNTER — Ambulatory Visit (INDEPENDENT_AMBULATORY_CARE_PROVIDER_SITE_OTHER): Payer: Medicare Other | Admitting: Psychiatry

## 2011-06-12 DIAGNOSIS — F4322 Adjustment disorder with anxiety: Secondary | ICD-10-CM

## 2011-06-12 DIAGNOSIS — F329 Major depressive disorder, single episode, unspecified: Secondary | ICD-10-CM

## 2011-06-12 LAB — BASIC METABOLIC PANEL
BUN: 13 mg/dL (ref 6–23)
CO2: 27 mEq/L (ref 19–32)
Calcium: 9.2 mg/dL (ref 8.4–10.5)
Creatinine, Ser: 0.7 mg/dL (ref 0.4–1.2)
Glucose, Bld: 84 mg/dL (ref 70–99)

## 2011-06-12 NOTE — Telephone Encounter (Signed)
Please advise-VAL pt-last written 05/11/2011 #60, 0 refills.

## 2011-06-12 NOTE — Telephone Encounter (Signed)
Rx faxed to [facility]. 

## 2011-06-14 ENCOUNTER — Emergency Department (HOSPITAL_COMMUNITY)
Admission: EM | Admit: 2011-06-14 | Discharge: 2011-06-14 | Disposition: A | Discharge status: Home / Self Care | Payer: Medicare Other | Attending: Emergency Medicine | Admitting: Emergency Medicine

## 2011-06-14 DIAGNOSIS — Z87891 Personal history of nicotine dependence: Secondary | ICD-10-CM | POA: Insufficient documentation

## 2011-06-14 DIAGNOSIS — I10 Essential (primary) hypertension: Secondary | ICD-10-CM | POA: Insufficient documentation

## 2011-06-14 DIAGNOSIS — M545 Low back pain, unspecified: Secondary | ICD-10-CM | POA: Insufficient documentation

## 2011-06-14 DIAGNOSIS — F411 Generalized anxiety disorder: Secondary | ICD-10-CM | POA: Insufficient documentation

## 2011-06-14 DIAGNOSIS — J4489 Other specified chronic obstructive pulmonary disease: Secondary | ICD-10-CM | POA: Insufficient documentation

## 2011-06-14 DIAGNOSIS — F329 Major depressive disorder, single episode, unspecified: Secondary | ICD-10-CM | POA: Insufficient documentation

## 2011-06-14 DIAGNOSIS — J449 Chronic obstructive pulmonary disease, unspecified: Secondary | ICD-10-CM | POA: Insufficient documentation

## 2011-06-14 DIAGNOSIS — F3289 Other specified depressive episodes: Secondary | ICD-10-CM | POA: Insufficient documentation

## 2011-06-14 DIAGNOSIS — M5430 Sciatica, unspecified side: Secondary | ICD-10-CM | POA: Insufficient documentation

## 2011-06-14 LAB — URINALYSIS, ROUTINE W REFLEX MICROSCOPIC
Bilirubin Urine: NEGATIVE
Hgb urine dipstick: NEGATIVE
Protein, ur: NEGATIVE mg/dL
Urobilinogen, UA: 0.2 mg/dL (ref 0.0–1.0)

## 2011-06-18 ENCOUNTER — Emergency Department (HOSPITAL_COMMUNITY): Payer: Medicare Other

## 2011-06-18 ENCOUNTER — Emergency Department (HOSPITAL_COMMUNITY)
Admission: EM | Admit: 2011-06-18 | Discharge: 2011-06-18 | Disposition: A | Discharge status: Home / Self Care | Payer: Medicare Other | Attending: Emergency Medicine | Admitting: Emergency Medicine

## 2011-06-18 DIAGNOSIS — Z79899 Other long term (current) drug therapy: Secondary | ICD-10-CM | POA: Insufficient documentation

## 2011-06-18 DIAGNOSIS — F341 Dysthymic disorder: Secondary | ICD-10-CM | POA: Insufficient documentation

## 2011-06-18 DIAGNOSIS — M1990 Unspecified osteoarthritis, unspecified site: Secondary | ICD-10-CM | POA: Insufficient documentation

## 2011-06-18 DIAGNOSIS — J438 Other emphysema: Secondary | ICD-10-CM | POA: Insufficient documentation

## 2011-06-18 DIAGNOSIS — M545 Low back pain, unspecified: Secondary | ICD-10-CM | POA: Insufficient documentation

## 2011-06-18 DIAGNOSIS — M25559 Pain in unspecified hip: Secondary | ICD-10-CM | POA: Insufficient documentation

## 2011-06-18 DIAGNOSIS — IMO0001 Reserved for inherently not codable concepts without codable children: Secondary | ICD-10-CM | POA: Insufficient documentation

## 2011-06-18 DIAGNOSIS — D869 Sarcoidosis, unspecified: Secondary | ICD-10-CM | POA: Insufficient documentation

## 2011-06-18 DIAGNOSIS — I10 Essential (primary) hypertension: Secondary | ICD-10-CM | POA: Insufficient documentation

## 2011-06-19 ENCOUNTER — Ambulatory Visit: Payer: Medicare Other | Admitting: Psychiatry

## 2011-06-19 ENCOUNTER — Encounter: Payer: Self-pay | Admitting: Internal Medicine

## 2011-06-19 ENCOUNTER — Telehealth: Payer: Self-pay

## 2011-06-19 ENCOUNTER — Ambulatory Visit (INDEPENDENT_AMBULATORY_CARE_PROVIDER_SITE_OTHER): Payer: Medicare Other | Admitting: Internal Medicine

## 2011-06-19 VITALS — BP 124/72 | HR 93 | Temp 98.2°F | Ht 68.0 in | Wt 307.0 lb

## 2011-06-19 DIAGNOSIS — M5430 Sciatica, unspecified side: Secondary | ICD-10-CM

## 2011-06-19 DIAGNOSIS — M5416 Radiculopathy, lumbar region: Secondary | ICD-10-CM

## 2011-06-19 DIAGNOSIS — M5431 Sciatica, right side: Secondary | ICD-10-CM

## 2011-06-19 DIAGNOSIS — IMO0002 Reserved for concepts with insufficient information to code with codable children: Secondary | ICD-10-CM

## 2011-06-19 DIAGNOSIS — Z23 Encounter for immunization: Secondary | ICD-10-CM

## 2011-06-19 NOTE — Telephone Encounter (Signed)
[facility] Triage Call Report Triage Record Num: [id number] Operator: Jeraldine Loots Patient Name: [name] Call Date & Time: 06/18/2011 2:13:55PM Patient Phone: [phone] PCP: Rene Paci Patient Gender: Female PCP Fax : [phone] Patient DOB: August 05, 1966 [facility] Name[facility] Reason for Call: Pt seen at ED on Tues 9/18. ED Dx issue was with sciatic nerve and gave valium, prednisone. On Oxycodone for pain. Not having any relief with the pain and the pain is actually worse. No fever. No pain on urination, just went she attempts to wipe herself after urinating. LMP 4/12, not sexually active. Sent back to ED for evaluation. Protocol(s) Used: Back Symptoms Recommended Outcome per Protocol: See ED Immediately Reason for Outcome: New onset of severe disabling back pain (unable to stand upright) Care Advice: ~ Another adult should drive. ~ IMMEDIATE ACTION Write down provider's name. List or place the following in a bag for transport with the patient: current prescription and/or nonprescription medications; alternative treatments, therapies and medications; and street drugs. ~ 06/18/2011 2:22:41PM Page 1 of 1 [email or web address]

## 2011-06-19 NOTE — Progress Notes (Signed)
  Subjective:    [name], female    DOB: Nov 26, 1965, 45 y.o.   MRN: [id number]  HPI  Here with back pain Started 6 days ago Precipitated by bending over after shower Instant pain in right posterior thigh and radiating into R buttock/flank Seen Er x 2 9/18 and 9/23 for same - initially tx with pred pak, valium - not improved; also on muscle relaxer, chronic oxy and NSAIDs Pain slightly improved with lying prone - worse sitting  Past Medical History  Diagnosis Date  . Sleep apnea     noncompliant w/ CPAP  . COPD (chronic obstructive pulmonary disease)     moderate airflow obstruction, suspect d/t emphysema  . Hypertension   . Hyperlipidemia   . Chronic headache   . Fibromyalgia     daily narcotics  . Anxiety     hx chronic BZ use, stopped 07/2010  . Anemia   . Pulmonary sarcoidosis     unimpressive CT chest 2011  . Colonic polyp   . GERD (gastroesophageal reflux disease)   . ALLERGIC RHINITIS   . Asthma   . CHF (congestive heart failure)     Diastolic with fluid overload, May, 2012  . Ejection fraction     EF 60%, echo, May, 2012     Review of Systems  Constitutional: Negative for fever and diaphoresis.  Cardiovascular: Positive for chest pain and leg swelling.  Genitourinary: Negative for dysuria, hematuria and pelvic pain.       Objective:   Physical Exam BP 124/72  Pulse 93  Temp(Src) 98.2 F (36.8 C) (Oral)  Ht 5\' 8"  (1.727 m)  Wt 307 lb (139.254 kg)  BMI 46.68 kg/m2  SpO2 91% Constitutional: She is overweight; appears uncomfortable but well-developed and well-nourished.  Neck: Normal range of motion. Neck supple. No JVD present. No thyromegaly present.  Cardiovascular: Normal rate, regular rhythm and normal heart sounds.  No murmur heard. No BLE edema. Pulmonary/Chest: Effort normal and breath sounds normal. No respiratory distress. She has no wheezes.  Musculoskeletal: Back: full range of motion of thoracic and lumbar spine. mildly  tender to palpation. Positive ipsilateral straight leg raise. DTR's are symmetrically intact. Sensation intact in all dermatomes of the lower extremities. Full strength to manual muscle testing and able to heel toe walk without difficulty and ambulates with antalgic gait. Skin: Skin is warm and dry. No rash noted. No erythema.  Psychiatric: She has a normal mood and affect. Her behavior is normal. Judgment and thought content normal.   Xray l-spine and r hip reviewed - NAD     Assessment & Plan:  R sciatica - consistent with lumbar radiculopathy - unimprrved with conservative med mgmt - will refer for MRI and Nsurg eval - repeat pred pak, continue muscle relaxers and NSAIDs as well as chronic oxy - Yvette;y refill done given acute flare pain symptoms

## 2011-06-19 NOTE — Telephone Encounter (Signed)
reviewed

## 2011-06-20 MED ORDER — PREDNISONE (PAK) 10 MG PO TABS: 10.0000 mg | ORAL_TABLET | ORAL | Status: DC

## 2011-06-20 NOTE — Patient Instructions (Signed)
It was good to see you today. Repeat pred pak and continue muscle relaxers and oxy as needed - Your prescription(s) and oxy refill have been submitted to your pharmacy or given to you today. Please take as directed and contact our office if you believe you are having problem(s) with the medication(s). we'll make referral to back specialist and for MRI low back. Our office will contact you regarding appointment(s) once made.

## 2011-06-21 ENCOUNTER — Emergency Department (HOSPITAL_COMMUNITY)
Admission: EM | Admit: 2011-06-21 | Discharge: 2011-06-22 | Disposition: A | Discharge status: Home / Self Care | Payer: Medicare Other | Attending: Emergency Medicine | Admitting: Emergency Medicine

## 2011-06-21 DIAGNOSIS — M545 Low back pain, unspecified: Secondary | ICD-10-CM | POA: Insufficient documentation

## 2011-06-21 DIAGNOSIS — R110 Nausea: Secondary | ICD-10-CM | POA: Insufficient documentation

## 2011-06-21 DIAGNOSIS — F341 Dysthymic disorder: Secondary | ICD-10-CM | POA: Insufficient documentation

## 2011-06-21 DIAGNOSIS — I10 Essential (primary) hypertension: Secondary | ICD-10-CM | POA: Insufficient documentation

## 2011-06-21 DIAGNOSIS — Z79899 Other long term (current) drug therapy: Secondary | ICD-10-CM | POA: Insufficient documentation

## 2011-06-21 DIAGNOSIS — J438 Other emphysema: Secondary | ICD-10-CM | POA: Insufficient documentation

## 2011-06-21 DIAGNOSIS — D869 Sarcoidosis, unspecified: Secondary | ICD-10-CM | POA: Insufficient documentation

## 2011-06-22 ENCOUNTER — Ambulatory Visit
Admission: RE | Admit: 2011-06-22 | Discharge: 2011-06-22 | Disposition: A | Discharge status: Home / Self Care | Payer: Medicare Other | Source: Ambulatory Visit | Attending: Internal Medicine | Admitting: Internal Medicine

## 2011-06-22 DIAGNOSIS — M5416 Radiculopathy, lumbar region: Secondary | ICD-10-CM

## 2011-06-22 DIAGNOSIS — M5431 Sciatica, right side: Secondary | ICD-10-CM

## 2011-06-26 ENCOUNTER — Ambulatory Visit: Payer: Medicare Other | Admitting: Internal Medicine

## 2011-06-27 ENCOUNTER — Emergency Department (HOSPITAL_COMMUNITY)
Admission: EM | Admit: 2011-06-27 | Discharge: 2011-06-27 | Disposition: A | Discharge status: Home / Self Care | Payer: Medicare Other | Attending: Emergency Medicine | Admitting: Emergency Medicine

## 2011-06-27 DIAGNOSIS — M7989 Other specified soft tissue disorders: Secondary | ICD-10-CM | POA: Insufficient documentation

## 2011-06-27 DIAGNOSIS — J4489 Other specified chronic obstructive pulmonary disease: Secondary | ICD-10-CM | POA: Insufficient documentation

## 2011-06-27 DIAGNOSIS — M79609 Pain in unspecified limb: Secondary | ICD-10-CM | POA: Insufficient documentation

## 2011-06-27 DIAGNOSIS — R209 Unspecified disturbances of skin sensation: Secondary | ICD-10-CM | POA: Insufficient documentation

## 2011-06-27 DIAGNOSIS — I10 Essential (primary) hypertension: Secondary | ICD-10-CM | POA: Insufficient documentation

## 2011-06-27 DIAGNOSIS — J449 Chronic obstructive pulmonary disease, unspecified: Secondary | ICD-10-CM | POA: Insufficient documentation

## 2011-06-29 ENCOUNTER — Telehealth: Payer: Self-pay | Admitting: *Deleted

## 2011-06-29 NOTE — Telephone Encounter (Signed)
Left msg on vm requesting to speak with Dr. Felicity Coyer. They are wanting her to have back surgery, and she does not want to have....06/29/11@11 :24am/LMB

## 2011-06-30 ENCOUNTER — Encounter (HOSPITAL_COMMUNITY)
Admission: RE | Admit: 2011-06-30 | Discharge: 2011-06-30 | Disposition: A | Discharge status: Home / Self Care | Payer: Medicare Other | Source: Ambulatory Visit | Attending: Neurosurgery | Admitting: Neurosurgery

## 2011-06-30 LAB — CBC
MCHC: 33.3 g/dL (ref 30.0–36.0)
Platelets: 179 10*3/uL (ref 150–400)
RDW: 17.7 % — ABNORMAL HIGH (ref 11.5–15.5)
WBC: 6 10*3/uL (ref 4.0–10.5)

## 2011-06-30 LAB — BASIC METABOLIC PANEL
Chloride: 98 mEq/L (ref 96–112)
Creatinine, Ser: 0.92 mg/dL (ref 0.50–1.10)
GFR calc Af Amer: 87 mL/min — ABNORMAL LOW (ref >90)
GFR calc non Af Amer: 75 mL/min — ABNORMAL LOW (ref >90)
Potassium: 3.9 mEq/L (ref 3.5–5.1)

## 2011-06-30 LAB — SURGICAL PCR SCREEN: MRSA, PCR: NEGATIVE

## 2011-06-30 NOTE — Telephone Encounter (Signed)
i called pt and reviewed same - will discuss more at [facility] next week but given MRI findings and continued "severe" leg pain, surg is likely best option for this pain relief - pt will consider same

## 2011-07-03 ENCOUNTER — Ambulatory Visit: Payer: Medicare Other | Admitting: Internal Medicine

## 2011-07-03 DIAGNOSIS — Z0289 Encounter for other administrative examinations: Secondary | ICD-10-CM

## 2011-07-06 ENCOUNTER — Inpatient Hospital Stay (HOSPITAL_COMMUNITY): Payer: Medicare Other

## 2011-07-06 ENCOUNTER — Inpatient Hospital Stay (HOSPITAL_COMMUNITY)
Admission: RE | Admit: 2011-07-06 | Discharge: 2011-07-07 | DRG: 490 | Disposition: A | Discharge status: Home / Self Care | Payer: Medicare Other | Source: Ambulatory Visit | Attending: Neurosurgery | Admitting: Neurosurgery

## 2011-07-06 DIAGNOSIS — F411 Generalized anxiety disorder: Secondary | ICD-10-CM | POA: Diagnosis present

## 2011-07-06 DIAGNOSIS — M51379 Other intervertebral disc degeneration, lumbosacral region without mention of lumbar back pain or lower extremity pain: Secondary | ICD-10-CM | POA: Diagnosis present

## 2011-07-06 DIAGNOSIS — Z882 Allergy status to sulfonamides status: Secondary | ICD-10-CM

## 2011-07-06 DIAGNOSIS — D869 Sarcoidosis, unspecified: Secondary | ICD-10-CM | POA: Diagnosis present

## 2011-07-06 DIAGNOSIS — IMO0001 Reserved for inherently not codable concepts without codable children: Secondary | ICD-10-CM | POA: Diagnosis present

## 2011-07-06 DIAGNOSIS — M5126 Other intervertebral disc displacement, lumbar region: Principal | ICD-10-CM | POA: Diagnosis present

## 2011-07-06 DIAGNOSIS — G4733 Obstructive sleep apnea (adult) (pediatric): Secondary | ICD-10-CM | POA: Diagnosis present

## 2011-07-06 DIAGNOSIS — K219 Gastro-esophageal reflux disease without esophagitis: Secondary | ICD-10-CM | POA: Diagnosis present

## 2011-07-06 DIAGNOSIS — J449 Chronic obstructive pulmonary disease, unspecified: Secondary | ICD-10-CM | POA: Diagnosis present

## 2011-07-06 DIAGNOSIS — Z79899 Other long term (current) drug therapy: Secondary | ICD-10-CM

## 2011-07-06 DIAGNOSIS — Z91199 Patient's noncompliance with other medical treatment and regimen due to unspecified reason: Secondary | ICD-10-CM

## 2011-07-06 DIAGNOSIS — I10 Essential (primary) hypertension: Secondary | ICD-10-CM | POA: Diagnosis present

## 2011-07-06 DIAGNOSIS — I509 Heart failure, unspecified: Secondary | ICD-10-CM | POA: Diagnosis present

## 2011-07-06 DIAGNOSIS — E785 Hyperlipidemia, unspecified: Secondary | ICD-10-CM | POA: Diagnosis present

## 2011-07-06 DIAGNOSIS — Z01812 Encounter for preprocedural laboratory examination: Secondary | ICD-10-CM

## 2011-07-06 DIAGNOSIS — Z87891 Personal history of nicotine dependence: Secondary | ICD-10-CM

## 2011-07-06 DIAGNOSIS — Z888 Allergy status to other drugs, medicaments and biological substances status: Secondary | ICD-10-CM

## 2011-07-06 DIAGNOSIS — J4489 Other specified chronic obstructive pulmonary disease: Secondary | ICD-10-CM | POA: Diagnosis present

## 2011-07-06 DIAGNOSIS — Z9119 Patient's noncompliance with other medical treatment and regimen: Secondary | ICD-10-CM

## 2011-07-06 DIAGNOSIS — M5137 Other intervertebral disc degeneration, lumbosacral region: Secondary | ICD-10-CM | POA: Diagnosis present

## 2011-07-06 DIAGNOSIS — I5032 Chronic diastolic (congestive) heart failure: Secondary | ICD-10-CM | POA: Diagnosis present

## 2011-07-06 DIAGNOSIS — Z91040 Latex allergy status: Secondary | ICD-10-CM

## 2011-07-06 DIAGNOSIS — M47817 Spondylosis without myelopathy or radiculopathy, lumbosacral region: Secondary | ICD-10-CM | POA: Diagnosis present

## 2011-07-06 HISTORY — PX: LUMBAR MICRODISCECTOMY: SHX99

## 2011-07-07 ENCOUNTER — Inpatient Hospital Stay (HOSPITAL_COMMUNITY): Payer: Medicare Other

## 2011-07-07 DIAGNOSIS — R0902 Hypoxemia: Secondary | ICD-10-CM

## 2011-07-07 DIAGNOSIS — J9620 Acute and chronic respiratory failure, unspecified whether with hypoxia or hypercapnia: Secondary | ICD-10-CM

## 2011-07-07 DIAGNOSIS — G4710 Hypersomnia, unspecified: Secondary | ICD-10-CM

## 2011-07-07 DIAGNOSIS — G4730 Sleep apnea, unspecified: Secondary | ICD-10-CM

## 2011-07-07 LAB — BASIC METABOLIC PANEL
CO2: 27 mEq/L (ref 19–32)
Calcium: 8.8 mg/dL (ref 8.4–10.5)
Creatinine, Ser: 0.81 mg/dL (ref 0.50–1.10)
GFR calc Af Amer: >90 mL/min (ref >90)
Sodium: 133 mEq/L — ABNORMAL LOW (ref 135–145)

## 2011-07-07 LAB — CBC
MCH: 26.6 pg (ref 26.0–34.0)
Platelets: 150 10*3/uL (ref 150–400)
RBC: 4.85 MIL/uL (ref 3.87–5.11)
RDW: 16.8 % — ABNORMAL HIGH (ref 11.5–15.5)
WBC: 8.9 10*3/uL (ref 4.0–10.5)

## 2011-07-11 ENCOUNTER — Other Ambulatory Visit: Payer: Self-pay | Admitting: Endocrinology

## 2011-07-11 ENCOUNTER — Other Ambulatory Visit: Payer: Self-pay | Admitting: Internal Medicine

## 2011-07-11 DIAGNOSIS — Z1231 Encounter for screening mammogram for malignant neoplasm of breast: Secondary | ICD-10-CM

## 2011-07-11 NOTE — Telephone Encounter (Signed)
Please advise in Dr. Diamantina Monks absence-last written 06/12/2011 #60 with 0 refills

## 2011-07-11 NOTE — Op Note (Signed)
[name], [name]              ACCOUNT NO.:  [id number]  MEDICAL RECORD NO.:  [id number]  LOCATION:  3108                         FACILITY:  [facility]  PHYSICIAN:  Danae Orleans. Venetia Maxon, M.D.  DATE OF BIRTH:  06-20-1966  DATE OF PROCEDURE:  07/06/2011 DATE OF DISCHARGE:                              OPERATIVE REPORT   PREOPERATIVE DIAGNOSES:  Right L4-5 herniated lumbar disk with spondylosis, degenerative disk disease, and radiculopathy.  Morbid obesity and chronic obstructive pulmonary disease.  POSTOPERATIVE DIAGNOSES:  Right L4-5 herniated lumbar disk with spondylosis, degenerative disk disease, and radiculopathy.  PROCEDURE:  Right L4-5 microdiskectomy with microdissection.  SURGEON:  Danae Orleans. Venetia Maxon, MD  ASSISTANTS:  Georgiann Cocker, RN and Hilda Lias, MD  ANESTHESIA:  General endotracheal anesthesia.  ESTIMATED BLOOD LOSS:  Minimal.  COMPLICATIONS:  None.  DISPOSITION:  To recovery.  INDICATIONS:  [name] is a 45 year old morbidly obese woman with pulmonary sarcoid and COPD who is on home BiPAP and Dr. Marcelyn Bruins is her pulmonologist.  She has a large free fragment disk herniation at L4- 5 on the right with severe right L4 nerve root compression.  It was elected to take her to surgery for microdiskectomy at this affected level.  PROCEDURE:  [name] was brought to the operating room.  Following satisfactory and uncomplicated induction of general endotracheal anesthesia and placement of intravenous lines, the patient was placed in a prone position on the operating table on Wilson frame.  Her low back was prepped and draped in the usual sterile fashion.  The area of planned incision was infiltrated with local lidocaine.  Incision was made, carried through approximately 6 inches of adipose tissue.  The lumbodorsal fascia was incised sharply on the right side of midline. Subperiosteal dissection was performed exposing L4-5 and L5-S1 levels. An intraoperative  radiograph confirmed marker probes at these levels.  A hemi-semilaminectomy of L4 was performed with high-speed drill and completed with Kerrison rongeurs as well as a generous foraminotomy overlying the superior aspect of L5.  The lateral recess was thoroughly decompressed.  Microscope was used and using microdissection technique, there was significant disk bulge which was opened with a hook and multiple fragments of disk material were removed.  There was also a large free fragment of herniated disk material which was removed from the underside of the L4 nerve root with significant decompression of the thecal sac and L4 nerve root.  The interspace was further cleared of residual loose disk material.  The wound was irrigated and bathed in Depo-Medrol and fentanyl.  Self-retaining retractor was removed.  The lumbodorsal fascia was closed with 0 Vicryl sutures, subcutaneous tissues were approximated 2-0 Vicryl interrupted inverted sutures, and skin edges were reapproximated with 3- 0 Vicryl subcuticular stitch.  Wound was dressed with Dermabond.  The patient was extubated in the operating room and taken to the recovery room in stable and satisfactory condition, having tolerated his operation well.  Counts were correct at the end of the case.     Danae Orleans. Venetia Maxon, M.D.     JDS/MEDQ  D:  07/06/2011  T:  07/06/2011  Job:  [id number]  Electronically Signed by Maeola Harman M.D. on 07/11/2011 11:44:32  AM

## 2011-07-11 NOTE — Telephone Encounter (Signed)
Rx faxed to [facility]. 

## 2011-07-24 ENCOUNTER — Encounter: Payer: Self-pay | Admitting: Internal Medicine

## 2011-07-24 ENCOUNTER — Encounter: Payer: Self-pay | Admitting: *Deleted

## 2011-07-24 ENCOUNTER — Other Ambulatory Visit (INDEPENDENT_AMBULATORY_CARE_PROVIDER_SITE_OTHER): Payer: Medicare Other

## 2011-07-24 ENCOUNTER — Ambulatory Visit (INDEPENDENT_AMBULATORY_CARE_PROVIDER_SITE_OTHER): Payer: Medicare Other | Admitting: Internal Medicine

## 2011-07-24 DIAGNOSIS — M5431 Sciatica, right side: Secondary | ICD-10-CM

## 2011-07-24 DIAGNOSIS — I509 Heart failure, unspecified: Secondary | ICD-10-CM

## 2011-07-24 DIAGNOSIS — M5430 Sciatica, unspecified side: Secondary | ICD-10-CM

## 2011-07-24 DIAGNOSIS — I5032 Chronic diastolic (congestive) heart failure: Secondary | ICD-10-CM

## 2011-07-24 DIAGNOSIS — R002 Palpitations: Secondary | ICD-10-CM

## 2011-07-24 LAB — TSH: TSH: 0.98 u[IU]/mL (ref 0.35–5.50)

## 2011-07-24 NOTE — Patient Instructions (Signed)
It was good to see you today. We have reviewed your hospital surgery records including labs and tests today We'll compose a letter stating you have had medical illnesses starting September due to your back pain and surgery issues for you to provide to your school as needed Test(s) ordered today. Your results will be called to you after review (48-72hours after test completion). If any changes need to be made, you will be notified at that time. we'll make referral for heart monitor due to palpitations. Our office will contact you regarding appointment(s) once made. Keep followup with cardiologist Dr. Myrtis Ser as planned and Dr. Venetia Maxon for back pain .Medications reviewed, no changes at this time. Please schedule followup in 3-4 months for review, call sooner if problems.

## 2011-07-24 NOTE — Assessment & Plan Note (Signed)
S/p 01/2011 hosp for diuresis - changed lasix to max dose torsemide bid and reduced steroid dose as tolerated s/p [facility] cards OP consult 02/2011 for assistance with co-mgmt - to keep fluid intake <2L/d Poor tolerance of zaroxlyn 04/2011 so stopped same but continue max dose toresimde On aldactone as potassium sparing and plan to reduce Kdur dosing if back to normal range continue BiPAP as per Clance (pulm)

## 2011-07-24 NOTE — Progress Notes (Signed)
Subjective:    [name], female    DOB: 05/30/1966, 45 y.o.   MRN: [id number]  HPI   Here with palpitations Started 1 week ago Precipitated by ?surgery 07/06/11 3 episodes lasting approx 1 hour associated with shortness of breath and "skipping" sensation during spell No chest pain, no edema or med changes No dizzy or loss of consciousness   Past Medical History  Diagnosis Date  . Sleep apnea     noncompliant w/ CPAP  . COPD (chronic obstructive pulmonary disease)     moderate airflow obstruction, suspect d/t emphysema  . Hypertension   . Hyperlipidemia   . Chronic headache   . Fibromyalgia     daily narcotics  . Anxiety     hx chronic BZ use, stopped 07/2010  . Anemia   . Pulmonary sarcoidosis     unimpressive CT chest 2011  . Colonic polyp   . GERD (gastroesophageal reflux disease)   . ALLERGIC RHINITIS   . Asthma   . CHF (congestive heart failure)     Diastolic with fluid overload, May, 2012  . Ejection fraction     EF 60%, echo, May, 2012     Review of Systems  Constitutional: Negative for fever, diaphoresis and fatigue.  Respiratory: Negative for cough and chest tightness.   Cardiovascular: Positive for leg swelling. Negative for chest pain and palpitations.       Objective:   Physical Exam  BP 128/78  Pulse 96  Temp(Src) 97 F (36.1 C) (Oral)  Wt 310 lb (140.615 kg)  SpO2 91% Constitutional: She is overweight; appears uncomfortable but well-developed and well-nourished.  Neck: Normal range of motion. Neck supple. No JVD present. No thyromegaly present.  Cardiovascular: Normal rate, regular rhythm and normal heart sounds.  No murmur heard. No BLE edema. Pulmonary/Chest: Effort normal and breath sounds normal. No respiratory distress. She has no wheezes.  Psychiatric: She has a normal mood and affect. Her behavior is normal. Judgment and thought content normal.   Lab Results  Component Value Date   WBC 8.9 07/07/2011   HGB 12.9  07/07/2011   HCT 38.6 07/07/2011   PLT 150 07/07/2011   GLUCOSE 175* 07/07/2011   CHOL  Value: 189        ATP III CLASSIFICATION:  <200     mg/dL   Desirable  469-629  mg/dL   Borderline High  >=528    mg/dL   High        01/06/2439   TRIG 89 03/07/2010   HDL 79 03/07/2010   LDLCALC  Value: 92        Total Cholesterol/HDL:CHD Risk Coronary Heart Disease Risk Table                     Men   Women  1/2 Average Risk   3.4   3.3  Average Risk       5.0   4.4  2 X Average Risk   9.6   7.1  3 X Average Risk  23.4   11.0        Use the calculated Patient Ratio above and the CHD Risk Table to determine the patient's CHD Risk.        ATP III CLASSIFICATION (LDL):  <100     mg/dL   Optimal  102-725  mg/dL   Near or Above  Optimal  130-159  mg/dL   Borderline  956-387  mg/dL   High  >564     mg/dL   Very High 3/32/9518   ALT 31 05/10/2011   AST 26 05/10/2011   NA 133* 07/07/2011   K 4.3 07/07/2011   CL 98 07/07/2011   CREATININE 0.81 07/07/2011   BUN 12 07/07/2011   CO2 27 07/07/2011   TSH 1.279 02/02/2011       Assessment & Plan:   Palpitations - check TSH and arrange heart monitor - reports prior hx same and on propanolol in past ([place]) - follow up with cards planned -

## 2011-07-24 NOTE — Assessment & Plan Note (Addendum)
ER visit for RLE numbness and pain  01/2011 and again 06/2011> MRI with free fragment and encroachment on L4 root -  status post R L4-5 microdiscectomy 07/06/2011 > symptoms improved No change in chronic pain mgmt as ongoing

## 2011-07-27 ENCOUNTER — Ambulatory Visit (HOSPITAL_COMMUNITY): Payer: Medicare Other | Attending: Internal Medicine

## 2011-08-03 ENCOUNTER — Other Ambulatory Visit: Payer: Self-pay | Admitting: Internal Medicine

## 2011-08-07 ENCOUNTER — Other Ambulatory Visit: Payer: Self-pay | Admitting: Internal Medicine

## 2011-08-09 ENCOUNTER — Other Ambulatory Visit: Payer: Self-pay | Admitting: Internal Medicine

## 2011-08-10 NOTE — Telephone Encounter (Signed)
Faxed script back to [facility]@ [phone]....08/10/11@3 :26pm/LMB

## 2011-08-14 ENCOUNTER — Ambulatory Visit: Payer: Medicare Other | Admitting: Pulmonary Disease

## 2011-08-19 NOTE — Discharge Summary (Signed)
  NAMEKIELA, SHISLER              ACCOUNT NO.:  [id number]  MEDICAL RECORD NO.:  [id number]  LOCATION:  3108                         FACILITY:  [facility]  PHYSICIAN:  Danae Orleans. Venetia Maxon, M.D.  DATE OF BIRTH:  Mar 04, 1966  DATE OF ADMISSION:  07/06/2011 DATE OF DISCHARGE:  07/07/2011                              DISCHARGE SUMMARY   REASON FOR ADMISSION:  Lumbar disk herniation at L4-5, right with obstructive sleep apnea, morbid obesity, COPD.  FINAL DIAGNOSES:  Lumbar disk herniation at L4-5, right with obstructive sleep apnea, morbid obesity, chronic obstructive pulmonary disease.  HISTORY OF ILLNESS AND HOSPITAL COURSE:  Marliss Buttacavoli is a 45 year old morbidly obese woman with pulmonary sarcoid and COPD on home BiPAP.  Dr. Marcelyn Bruins, is her pulmonologist.  She had a large free fragment disk herniation at L4-5 on the right with severe right L4 nerve root compression.  It was elected for surgery for microdiskectomy at this affected level.  The patient did well with surgery, tolerated surgical procedure without difficulty, was observed in the ICU postoperatively, was extubated without difficulty and was doing well, and therefore was then discharged home with remaining on her preoperative medications of: 1. Cyclobenzaprine 10 mg every 8 hours as needed. 2. Spironolactone 25 mg daily. 3. Xanax 0.5 mg twice daily. 4. Ventolin inhaler 2 puffs every 4 hours. 5. Torsemide 100 mg twice daily. 6. Symbicort inhaled 2 puffs twice daily. 7. Spiriva 18 mcg inhaled one capsule daily. 8. Potassium chloride 20 mEq one capsule twice daily. 9. Oxycodone 10/325 every 4-6 hours as needed for pain.  Instructions to follow up in the office 3 weeks postoperatively.     Danae Orleans. Venetia Maxon, M.D.     JDS/MEDQ  D:  07/28/2011  T:  07/29/2011  Job:  [id number]  Electronically Signed by Maeola Harman M.D. on 08/19/2011 03:44:18 AM

## 2011-08-22 ENCOUNTER — Ambulatory Visit: Payer: Medicare Other | Admitting: Cardiology

## 2011-08-28 ENCOUNTER — Ambulatory Visit: Payer: Medicare Other | Admitting: Internal Medicine

## 2011-08-28 DIAGNOSIS — Z0289 Encounter for other administrative examinations: Secondary | ICD-10-CM

## 2011-08-30 ENCOUNTER — Encounter: Payer: Self-pay | Admitting: Internal Medicine

## 2011-08-30 ENCOUNTER — Ambulatory Visit (INDEPENDENT_AMBULATORY_CARE_PROVIDER_SITE_OTHER): Payer: Medicare Other | Admitting: Internal Medicine

## 2011-08-30 DIAGNOSIS — F411 Generalized anxiety disorder: Secondary | ICD-10-CM

## 2011-08-30 DIAGNOSIS — K219 Gastro-esophageal reflux disease without esophagitis: Secondary | ICD-10-CM

## 2011-08-30 MED ORDER — SERTRALINE HCL 25 MG PO TABS: 25.0000 mg | ORAL_TABLET | Freq: Every day | ORAL | Status: DC

## 2011-08-30 MED ORDER — ESOMEPRAZOLE MAGNESIUM 40 MG PO CPDR: 40.0000 mg | DELAYED_RELEASE_CAPSULE | Freq: Every day | ORAL | Status: DC

## 2011-08-30 NOTE — Assessment & Plan Note (Signed)
Chronic BZ - prev klonopin but changed to xanax Also overlap with depression symptoms Resume sertraline now - erx done

## 2011-08-30 NOTE — Patient Instructions (Signed)
It was good to see you today. Start sertraline for depression and change omeprazole to nexium as requested Your prescription(s) have been submitted to your pharmacy. Please take as directed and contact our office if you believe you are having problem(s) with the medication(s). Other Medications reviewed, no changes at this time. Call [phone] and ask about meeting time/location for bariatric surgery workshop Please schedule followup in 3-4 months, call sooner if problems.

## 2011-08-30 NOTE — Assessment & Plan Note (Signed)
Pt reports interest in lap band - to follow up with bariatric meeting seminars - encouraged to work on diet and exertion efforts as tolerated until then  Wt Readings from Last 3 Encounters:  08/30/11 316 lb 12.8 oz (143.7 kg)  07/24/11 310 lb (140.615 kg)  06/19/11 307 lb (139.254 kg)

## 2011-08-30 NOTE — Progress Notes (Signed)
Subjective:    [name], female    DOB: 08-Aug-1966, 45 y.o.   MRN: [id number]  HPI Here for follow up - reviewed chronic medical issues:  chronic hypoxic resp failure - home O2 dep since 2010 - overlap with OSA/OHS and obst dz per pulm - BiPAP qhs - reports compliance with ongoing medical treatment and home O2 and no changes in medication dose or frequency. denies adverse side effects related to current therapy. Quit smoking 08/2010!!  HTN - reports compliance with ongoing medical treatment and no changes in medication dose or frequency. denies adverse side effects related to current therapy - prior rash from dilt resolved with change back to amlodipine  dCHF, chronic - IP echo 01/2011 reviewed - reports compliance with ongoing medical treatment and no changes in medication dose or frequency. denies adverse side effects related to current therapy. improved edema and dyspnea  - continues bid diuretic - eval by cards for same 02/2011 - to limit fluids <2L/d  morbid obesity - weights reviewed - often has fluid related weight variance but remains obese  Anxiety/depression - chronic hx same with daily BZ use - changed from klonopin to xanax 07/2010 due to "too sleepy" - prev on sertraline, would like to resume same  GERD - reports compliance with ongoing medical treatment and no changes in medication dose or frequency. denies adverse side effects related to current therapy.   dyslipidemia -started on simva spring 2011- reports intermittent compliance with ongoing medical treatment and no changes in medication dose or frequency. denies adverse side effects related to current therapy.   fibromyalgia - chronic pain - on daily narcotics for years - would like to reduce dose if possible (reports on "10mg " while in [place] - now on "5" - but note prev hydrocod, now oxy) - pain affects chest, back, legs and "whole body" - resumed flexeril    Past Medical History  Diagnosis Date  . Sleep  apnea     noncompliant w/ CPAP  . COPD (chronic obstructive pulmonary disease)     moderate airflow obstruction, suspect d/t emphysema  . Hypertension   . Hyperlipidemia   . Chronic headache   . Fibromyalgia     daily narcotics  . Anxiety     hx chronic BZ use, stopped 07/2010  . Anemia   . Pulmonary sarcoidosis     unimpressive CT chest 2011  . Colonic polyp   . GERD (gastroesophageal reflux disease)   . ALLERGIC RHINITIS   . Asthma   . CHF (congestive heart failure)     Diastolic with fluid overload, May, 2012  . Ejection fraction     EF 60%, echo, May, 2012    Review of Systems  Constitutional: Positive for fatigue. Negative for fever.  Respiratory: Positive for shortness of breath. Negative for cough and wheezing.   Cardiovascular: Negative for chest pain and palpitations.  Neurological: Negative for headaches.       Objective:   Physical Exam  BP 120/86  Pulse 97  Temp(Src) 98.2 F (36.8 C) (Oral)  Wt 316 lb 12.8 oz (143.7 kg)  SpO2 93% Wt Readings from Last 3 Encounters:  08/30/11 316 lb 12.8 oz (143.7 kg)  07/24/11 310 lb (140.615 kg)  06/19/11 307 lb (139.254 kg)   Constitutional: She appears well-developed and well-nourished. Morbidly obese.  Cardiovascular: Normal rate, regular rhythm and normal heart sounds.  BLE with trace edema Pulmonary/Chest: Breath sounds diminished bilateral bases but no respiratory distress. She has no  wheezes.  Neurological: She is alert and oriented to person, place, and time. Coordination normal.  Psychiatric: She has a normal mood and affect. Her behavior is normal.    Lab Results  Component Value Date   WBC 8.9 07/07/2011   HGB 12.9 07/07/2011   HCT 38.6 07/07/2011   PLT 150 07/07/2011   CHOL  Value: 189        ATP III CLASSIFICATION:  <200     mg/dL   Desirable  366-440  mg/dL   Borderline High  >=347    mg/dL   High        01/17/9562   TRIG 89 03/07/2010   HDL 79 03/07/2010   ALT 31 8/75/6433   AST 26 05/10/2011    NA 133* 07/07/2011   K 4.3 07/07/2011   CL 98 07/07/2011   CREATININE 0.81 07/07/2011   BUN 12 07/07/2011   CO2 27 07/07/2011   TSH 0.98 07/24/2011        Assessment & Plan:  See problem list. Medications and labs reviewed today.

## 2011-08-30 NOTE — Assessment & Plan Note (Signed)
On omeprazole for years - subop control Try nexium at pt request - erx done

## 2011-09-08 ENCOUNTER — Other Ambulatory Visit: Payer: Self-pay | Admitting: Internal Medicine

## 2011-09-11 NOTE — Telephone Encounter (Signed)
Faxed script back to [facility] @ [phone]...09/11/11@12 :10pm/LMB

## 2011-09-12 ENCOUNTER — Encounter: Payer: Self-pay | Admitting: Pulmonary Disease

## 2011-09-12 ENCOUNTER — Ambulatory Visit (INDEPENDENT_AMBULATORY_CARE_PROVIDER_SITE_OTHER): Payer: Medicare Other | Admitting: Pulmonary Disease

## 2011-09-12 DIAGNOSIS — E662 Morbid (severe) obesity with alveolar hypoventilation: Secondary | ICD-10-CM

## 2011-09-12 DIAGNOSIS — J449 Chronic obstructive pulmonary disease, unspecified: Secondary | ICD-10-CM

## 2011-09-12 NOTE — Assessment & Plan Note (Signed)
The patient is wearing bilevel compliantly, feels that she is sleeping well with the device.  I've asked her to keep up with mask changes and also supplies.  I have also encouraged her to work aggressively on weight loss.

## 2011-09-12 NOTE — Progress Notes (Signed)
  Subjective:    [name], female    DOB: 1966-02-12, 45 y.o.   MRN: [id number]  HPI Patient comes in today for followup of her multiple pulmonary issues.  She has known COPD, obesity hypoventilation syndrome, as well as chronic diastolic heart failure, all of which are contributing to her chronic respiratory failure.  Patient has been doing fairly well since last visit, and denies any acute exacerbation.  She has a mild cough in the mornings that is productive of purulent mucus, but this is not an issue throughout the day.  She has gained a lot of weight since last visit, but most recently has lost 5 pounds back.  She is wearing her bilevel compliantly, and reports no issues with her mask or pressure.   Review of Systems  Constitutional: Positive for diaphoresis. Negative for fever and unexpected weight change.  HENT: Negative for ear pain, nosebleeds, congestion, sore throat, rhinorrhea, sneezing, trouble swallowing, dental problem, postnasal drip and sinus pressure.   Eyes: Negative for redness and itching.  Respiratory: Positive for cough, shortness of breath and wheezing. Negative for chest tightness.   Cardiovascular: Negative for palpitations and leg swelling.  Gastrointestinal: Negative for nausea and vomiting.  Genitourinary: Negative for dysuria.  Musculoskeletal: Negative for joint swelling.  Skin: Negative for rash.  Neurological: Negative for headaches.  Hematological: Does not bruise/bleed easily.  Psychiatric/Behavioral: Positive for dysphoric mood. The patient is nervous/anxious.        Objective:   Physical Exam Morbidly obese female in no acute distress No skin breakdown or pressure necrosis from the CPAP mask Chest is totally clear to auscultation Cardiac exam with regular rate and rhythm, 2/6 systolic murmur Lower extremities with mild edema, no cyanosis noted Alert and oriented, moves all 4 extremities.        Assessment & Plan:

## 2011-09-12 NOTE — Assessment & Plan Note (Signed)
The patient is on an aggressive bronchodilator regimen, and she denies any recent acute exacerbation.  She has not smoked in a year, and I have congratulated her on this.

## 2011-09-12 NOTE — Patient Instructions (Signed)
Continue on current inhalers. Stay on bipap, work on weight loss. Continue on your fluid medications. followup with me in 4mos.

## 2011-09-13 ENCOUNTER — Other Ambulatory Visit: Payer: Self-pay | Admitting: Internal Medicine

## 2011-09-14 ENCOUNTER — Telehealth: Payer: Self-pay | Admitting: *Deleted

## 2011-09-14 NOTE — Telephone Encounter (Signed)
MD received form from [place] stating pt been taking nexium & omperazole. Need to clarify which med pt is taking should not be taking both. Called pt no answer LMOM RTC....09/14/11@1 :56pm/LMB

## 2011-09-20 NOTE — Telephone Encounter (Signed)
Notified pt she states she takes nexium. Updated EPIC....09/20/11@12 :18pm/LMB

## 2011-09-26 DIAGNOSIS — R7989 Other specified abnormal findings of blood chemistry: Secondary | ICD-10-CM

## 2011-09-26 HISTORY — DX: Other specified abnormal findings of blood chemistry: R79.89

## 2011-09-29 ENCOUNTER — Other Ambulatory Visit: Payer: Self-pay | Admitting: Internal Medicine

## 2011-10-09 ENCOUNTER — Other Ambulatory Visit: Payer: Self-pay | Admitting: Internal Medicine

## 2011-10-10 NOTE — Telephone Encounter (Signed)
Rx faxed to pharmacy  

## 2011-10-11 ENCOUNTER — Emergency Department (HOSPITAL_COMMUNITY)
Admission: EM | Admit: 2011-10-11 | Discharge: 2011-10-12 | Disposition: A | Discharge status: Home / Self Care | Payer: Medicare Other | Attending: Emergency Medicine | Admitting: Emergency Medicine

## 2011-10-11 ENCOUNTER — Encounter (HOSPITAL_COMMUNITY): Payer: Self-pay | Admitting: *Deleted

## 2011-10-11 DIAGNOSIS — Z79899 Other long term (current) drug therapy: Secondary | ICD-10-CM | POA: Insufficient documentation

## 2011-10-11 DIAGNOSIS — IMO0001 Reserved for inherently not codable concepts without codable children: Secondary | ICD-10-CM | POA: Insufficient documentation

## 2011-10-11 DIAGNOSIS — J4489 Other specified chronic obstructive pulmonary disease: Secondary | ICD-10-CM | POA: Insufficient documentation

## 2011-10-11 DIAGNOSIS — J449 Chronic obstructive pulmonary disease, unspecified: Secondary | ICD-10-CM | POA: Insufficient documentation

## 2011-10-11 DIAGNOSIS — G4730 Sleep apnea, unspecified: Secondary | ICD-10-CM | POA: Insufficient documentation

## 2011-10-11 DIAGNOSIS — F411 Generalized anxiety disorder: Secondary | ICD-10-CM | POA: Insufficient documentation

## 2011-10-11 DIAGNOSIS — R7989 Other specified abnormal findings of blood chemistry: Secondary | ICD-10-CM | POA: Insufficient documentation

## 2011-10-11 DIAGNOSIS — E785 Hyperlipidemia, unspecified: Secondary | ICD-10-CM | POA: Insufficient documentation

## 2011-10-11 DIAGNOSIS — Z860100 Personal history of colon polyps, unspecified: Secondary | ICD-10-CM | POA: Insufficient documentation

## 2011-10-11 DIAGNOSIS — R109 Unspecified abdominal pain: Secondary | ICD-10-CM | POA: Insufficient documentation

## 2011-10-11 DIAGNOSIS — I10 Essential (primary) hypertension: Secondary | ICD-10-CM | POA: Insufficient documentation

## 2011-10-11 DIAGNOSIS — R110 Nausea: Secondary | ICD-10-CM | POA: Insufficient documentation

## 2011-10-11 DIAGNOSIS — K219 Gastro-esophageal reflux disease without esophagitis: Secondary | ICD-10-CM | POA: Insufficient documentation

## 2011-10-11 LAB — DIFFERENTIAL
Basophils Relative: 1 % (ref 0–1)
Lymphs Abs: 1.8 10*3/uL (ref 0.7–4.0)
Monocytes Relative: 9 % (ref 3–12)
Neutro Abs: 5.8 10*3/uL (ref 1.7–7.7)
Neutrophils Relative %: 65 % (ref 43–77)

## 2011-10-11 LAB — COMPREHENSIVE METABOLIC PANEL
ALT: 63 U/L — ABNORMAL HIGH (ref 0–35)
Albumin: 3.5 g/dL (ref 3.5–5.2)
Alkaline Phosphatase: 159 U/L — ABNORMAL HIGH (ref 39–117)
BUN: 9 mg/dL (ref 6–23)
Chloride: 92 mEq/L — ABNORMAL LOW (ref 96–112)
Glucose, Bld: 138 mg/dL — ABNORMAL HIGH (ref 70–99)
Potassium: 3.3 mEq/L — ABNORMAL LOW (ref 3.5–5.1)
Total Bilirubin: 0.5 mg/dL (ref 0.3–1.2)

## 2011-10-11 LAB — LIPASE, BLOOD: Lipase: 33 U/L (ref 11–59)

## 2011-10-11 LAB — CBC
Hemoglobin: 14.5 g/dL (ref 12.0–15.0)
RBC: 5.6 MIL/uL — ABNORMAL HIGH (ref 3.87–5.11)

## 2011-10-11 MED ORDER — DICYCLOMINE HCL 10 MG/ML IM SOLN
20.0000 mg | Freq: Once | INTRAMUSCULAR | Status: AC
  Administered 2011-10-11: 20 mg via INTRAMUSCULAR
  Filled 2011-10-11: qty 2

## 2011-10-11 MED ORDER — ONDANSETRON HCL 4 MG/2ML IJ SOLN
4.0000 mg | Freq: Once | INTRAMUSCULAR | Status: AC
  Administered 2011-10-11: 4 mg via INTRAVENOUS
  Filled 2011-10-11: qty 2

## 2011-10-11 NOTE — ED Notes (Signed)
Urine collected and placed at bedside.  

## 2011-10-12 ENCOUNTER — Emergency Department (HOSPITAL_COMMUNITY): Payer: Medicare Other

## 2011-10-12 LAB — URINALYSIS, ROUTINE W REFLEX MICROSCOPIC
Bilirubin Urine: NEGATIVE
Hgb urine dipstick: NEGATIVE
Ketones, ur: NEGATIVE mg/dL
Nitrite: NEGATIVE
pH: 5 (ref 5.0–8.0)

## 2011-10-12 MED ORDER — PROMETHAZINE HCL 25 MG PO TABS: 25.0000 mg | ORAL_TABLET | Freq: Four times a day (QID) | ORAL | Status: DC | PRN

## 2011-10-12 MED ORDER — HYDROCODONE-ACETAMINOPHEN 5-325 MG PO TABS: 2.0000 | ORAL_TABLET | ORAL | Status: DC | PRN

## 2011-10-12 MED ORDER — HYDROMORPHONE HCL PF 1 MG/ML IJ SOLN
1.0000 mg | Freq: Once | INTRAMUSCULAR | Status: AC
  Administered 2011-10-12: 1 mg via INTRAVENOUS
  Filled 2011-10-12: qty 1

## 2011-10-12 MED ORDER — IOHEXOL 300 MG/ML  SOLN
100.0000 mL | Freq: Once | INTRAMUSCULAR | Status: AC | PRN
  Administered 2011-10-12: 100 mL via INTRAVENOUS

## 2011-10-12 MED ORDER — ONDANSETRON HCL 4 MG/2ML IJ SOLN
4.0000 mg | Freq: Once | INTRAMUSCULAR | Status: AC
  Administered 2011-10-12: 4 mg via INTRAVENOUS
  Filled 2011-10-12: qty 2

## 2011-10-12 NOTE — ED Provider Notes (Signed)
History     CSN: [id number]  Arrival date & time 10/11/11  2030   First MD Initiated Contact with Patient 10/11/11 2225      Chief Complaint  Patient presents with  . Abdominal Pain    pt c/o bilat upper abd pain/cramping that began last night, pt denies n/v/d.     (Consider location/radiation/quality/duration/timing/severity/associated sxs/prior treatment) HPI Comments: Patient here with a day history of bilateral upper abdominal pain with nausea - she reports pain worse with eating, denies burning pain or radiation of the pain - denies fever or chills, reports normal bowel movements, denies abdominal surgeries.  Patient is a 46 y.o. female presenting with abdominal pain. The history is provided by the patient. No language interpreter was used.  Abdominal Pain The primary symptoms of the illness include abdominal pain and nausea. The primary symptoms of the illness do not include fever, fatigue, shortness of breath, vomiting, diarrhea, hematemesis, hematochezia, dysuria, vaginal discharge or vaginal bleeding. The current episode started yesterday. The onset of the illness was gradual. The problem has been gradually worsening.  The illness is associated with eating. The patient states that she believes she is currently not pregnant. The patient has not had a change in bowel habit. Symptoms associated with the illness do not include chills, anorexia, heartburn, constipation, urgency, hematuria, frequency or back pain.    Past Medical History  Diagnosis Date  . Sleep apnea     noncompliant w/ CPAP  . COPD (chronic obstructive pulmonary disease)     moderate airflow obstruction, suspect d/t emphysema  . Hypertension   . Hyperlipidemia   . Chronic headache   . Fibromyalgia     daily narcotics  . Anxiety     hx chronic BZ use, stopped 07/2010  . Anemia   . Pulmonary sarcoidosis     unimpressive CT chest 2011  . Colonic polyp   . GERD (gastroesophageal reflux disease)   . ALLERGIC  RHINITIS   . Asthma   . CHF (congestive heart failure)     Diastolic with fluid overload, May, 2012  . Ejection fraction     EF 60%, echo, May, 2012    Past Surgical History  Procedure Date  . Polypectomy 2011  . Lumbar microdiscectomy 07/06/2011    R L4-5, stern    Family History  Problem Relation Age of Onset  . Hypertension Mother   . Emphysema Father   . Hypertension Father   . Stomach cancer Father   . Allergies Brother   . Hypertension Brother   . Stomach cancer Brother   . Heart disease Mother   . Heart disease Father   . Heart disease Brother     History  Substance Use Topics  . Smoking status: Former Smoker -- 1.0 packs/day for 29 years    Quit date: 09/14/2010  . Smokeless tobacco: Not on file  . Alcohol Use: No    OB History    Grav Para Term Preterm Abortions TAB SAB Ect Mult Living                  Review of Systems  Constitutional: Negative for fever, chills and fatigue.  Respiratory: Negative for shortness of breath.   Gastrointestinal: Positive for nausea and abdominal pain. Negative for heartburn, vomiting, diarrhea, constipation, hematochezia, anorexia and hematemesis.  Genitourinary: Negative for dysuria, urgency, frequency, hematuria, vaginal bleeding and vaginal discharge.  Musculoskeletal: Negative for back pain.  All other systems reviewed and are negative.  Allergies  Azithromycin; Ciprofloxacin; Doxycycline; Latex; and Sulfamethoxazole w/trimethoprim  Home Medications   Current Outpatient Rx  Name Route Sig Dispense Refill  . ALBUTEROL SULFATE (2.5 MG/3ML) 0.083% IN NEBU Nebulization Take 3 mLs (2.5 mg total) by nebulization every 6 (six) hours as needed. 120 vial 3  . ALBUTEROL SULFATE HFA 108 (90 BASE) MCG/ACT IN AERS Inhalation Inhale 2 puffs into the lungs every 6 (six) hours as needed. 18 g 2  . ALPRAZOLAM 0.5 MG PO TABS  TAKE 1 TABLET BY MOUTH TWICE DAILY 60 tablet 0  . AMLODIPINE BESYLATE 5 MG PO TABS  TAKE 1 TABLET BY  MOUTH EVERY DAY 30 tablet 5  . BUDESONIDE-FORMOTEROL FUMARATE 160-4.5 MCG/ACT IN AERO Inhalation Inhale 2 puffs into the lungs 2 (two) times daily.      Marland Kitchen CETIRIZINE HCL 10 MG PO CHEW Oral Chew 10 mg by mouth at bedtime.     Marland Kitchen ESOMEPRAZOLE MAGNESIUM 40 MG PO CPDR Oral Take 1 capsule (40 mg total) by mouth daily. 30 capsule 1  . FLUTICASONE PROPIONATE 50 MCG/ACT NA SUSP Nasal Place 2 sprays into the nose every morning. 16 g 2  . OXYCODONE HCL 10 MG PO TABS  Take 1 by mouth twice a day as needed    . POTASSIUM CHLORIDE CRYS ER 20 MEQ PO TBCR Oral Take 20 mEq by mouth 2 (two) times daily.     Marland Kitchen PROMETHAZINE HCL 25 MG PO TABS  TAKE 1 TABLET BY MOUTH EVERY 6 HOURS AS NEEDED FOR NAUSEA 30 tablet 0  . SERTRALINE HCL 25 MG PO TABS Oral Take 1 tablet (25 mg total) by mouth daily. 30 tablet 2  . SPIRIVA HANDIHALER 18 MCG IN CAPS  INHALE CONTENTS OF ONE CAPSULE ONCE DAILY USING HANDIHALER 90 capsule 0  . SPIRONOLACTONE 25 MG PO TABS  Take 1 by mouth daily    . TORSEMIDE 100 MG PO TABS Oral Take 100 mg by mouth 2 (two) times daily.      Marland Kitchen HYDROCODONE-ACETAMINOPHEN 5-325 MG PO TABS Oral Take 2 tablets by mouth every 4 (four) hours as needed for pain. 30 tablet 0  . PROMETHAZINE HCL 25 MG PO TABS Oral Take 1 tablet (25 mg total) by mouth every 6 (six) hours as needed for nausea. 30 tablet 0    BP 123/81  Pulse 91  Temp(Src) 98.5 F (36.9 C) (Oral)  Resp 16  SpO2 100%  LMP 01/28/2011  Physical Exam  Nursing note and vitals reviewed. Constitutional: She is oriented to person, place, and time. She appears well-developed and well-nourished. No distress.       Morbidly obese  HENT:  Head: Normocephalic and atraumatic.  Right Ear: External ear normal.  Left Ear: External ear normal.  Nose: Nose normal.  Mouth/Throat: Oropharynx is clear and moist. No oropharyngeal exudate.  Eyes: Conjunctivae are normal. Pupils are equal, round, and reactive to light. No scleral icterus.  Neck: Normal range of motion.  Neck supple.  Cardiovascular: Normal rate, regular rhythm and normal heart sounds.  Exam reveals no gallop and no friction rub.   No murmur heard. Pulmonary/Chest: Effort normal and breath sounds normal. No respiratory distress. She has no wheezes. She has no rales. She exhibits no tenderness.  Abdominal: Soft. Bowel sounds are normal. There is tenderness in the right upper quadrant, epigastric area and left upper quadrant. There is no rigidity, no rebound, no guarding, no CVA tenderness and negative Murphy's sign.    Musculoskeletal: Normal range of motion.  Lymphadenopathy:    She has no cervical adenopathy.  Neurological: She is alert and oriented to person, place, and time. No cranial nerve deficit.  Skin: Skin is warm and dry.  Psychiatric: She has a normal mood and affect. Her behavior is normal. Judgment and thought content normal.    ED Course  Procedures (including critical care time)  Labs Reviewed  CBC - Abnormal; Notable for the following:    RBC 5.60 (*)    MCH 25.9 (*)    All other components within normal limits  COMPREHENSIVE METABOLIC PANEL - Abnormal; Notable for the following:    Sodium 134 (*)    Potassium 3.3 (*)    Chloride 92 (*)    Glucose, Bld 138 (*)    ALT 63 (*)    Alkaline Phosphatase 159 (*)    GFR calc non Af Amer 72 (*)    GFR calc Af Amer 83 (*)    All other components within normal limits  DIFFERENTIAL  LIPASE, BLOOD  URINALYSIS, ROUTINE W REFLEX MICROSCOPIC  PREGNANCY, URINE   Ct Abdomen Pelvis W Contrast  10/12/2011  *RADIOLOGY REPORT*  Clinical Data: Abdominal pain  CT ABDOMEN AND PELVIS WITH CONTRAST  Technique:  Multidetector CT imaging of the abdomen and pelvis was performed following the standard protocol during bolus administration of intravenous contrast.  Contrast: OMNIPAQUE IOHEXOL 300 MG/ML IV SOLN  Comparison: 11/18/2010 chest CT  Findings: Basilar bullous emphysema with interstitial thickening and atelectasis / scarring.   Normal heart size.  No pericardial or pleural effusion.  Abdomen:  Liver, collapsed gallbladder, the biliary system, pancreas, spleen, adrenal glands, kidneys demonstrate no acute process are within normal limits for age.  No abdominal free fluid, fluid collection, hemorrhage, hematoma, inflammatory process, abscess, or adenopathy.  No bowel obstruction pattern, dilatation, ileus, or free air.  Pelvis:  Incidental right ovarian 2.7 cm cyst, image 64.  No acute distal bowel process.  No pelvic free fluid, fluid collection, hemorrhage, adenopathy, abscess, inguinal abnormality, hernia. Urinary bladder unremarkable. Normal appendix.  Degenerative and postop findings of the spine.  IMPRESSION: No acute intra-abdominal or pelvic process.  Lower lobe chronic bullous emphysema, scarring and interstitial change  2.7 cm right ovarian cyst  Original Report Authenticated By: Judie Petit. Ruel Favors, M.D.     1. Abdominal pain   2. Elevated LFTs       MDM  Otherwirse well appearing female with basically normal labs (except mild elevation in transaminases with normal bili and lipase) without evidence of life threatening abdominal issue.  Is able to tolerate po fluids and food, will follow up with PCP, Dr. Felicity Coyer for further evaluation of this.          [name] [place], [place] 10/12/11 [phone]  Medical screening examination/treatment/procedure(s) were performed by non-physician practitioner and as supervising physician I was immediately available for consultation/collaboration.  Sunnie Nielsen, MD 10/12/11 [phone]

## 2011-10-12 NOTE — ED Notes (Signed)
MD at bedside. 

## 2011-10-12 NOTE — ED Notes (Signed)
Patient transported to CT 

## 2011-10-12 NOTE — Discharge Instructions (Signed)
Abdominal Pain Abdominal pain can be caused by many things. Your caregiver decides the seriousness of your pain by an examination and possibly blood tests and X-rays. Many cases can be observed and treated at home. Most abdominal pain is not caused by a disease and will probably improve without treatment. However, in many cases, more time must pass before a clear cause of the pain can be found. Before that point, it may not be known if you need more testing, or if hospitalization or surgery is needed. HOME CARE INSTRUCTIONS   Do not take laxatives unless directed by your caregiver.   Take pain medicine only as directed by your caregiver.   Only take over-the-counter or prescription medicines for pain, discomfort, or fever as directed by your caregiver.   Try a clear liquid diet (broth, tea, or water) for as long as directed by your caregiver. Slowly move to a bland diet as tolerated.  SEEK IMMEDIATE MEDICAL CARE IF:   The pain does not go away.   You have a fever.   You keep throwing up (vomiting).   The pain is felt only in portions of the abdomen. Pain in the right side could possibly be appendicitis. In an adult, pain in the left lower portion of the abdomen could be colitis or diverticulitis.   You pass bloody or black tarry stools.  MAKE SURE YOU:   Understand these instructions.   Will watch your condition.   Will get help right away if you are not doing well or get worse.  Document Released: 06/21/2005 Document Revised: 05/24/2011 Document Reviewed: 04/29/2008 [employer] Patient Information 2012 [employer]. 

## 2011-10-18 ENCOUNTER — Ambulatory Visit (INDEPENDENT_AMBULATORY_CARE_PROVIDER_SITE_OTHER): Payer: Medicare Other | Admitting: Internal Medicine

## 2011-10-18 ENCOUNTER — Encounter: Payer: Self-pay | Admitting: Internal Medicine

## 2011-10-18 ENCOUNTER — Other Ambulatory Visit (INDEPENDENT_AMBULATORY_CARE_PROVIDER_SITE_OTHER): Payer: Medicare Other

## 2011-10-18 VITALS — BP 120/62 | HR 90 | Temp 97.8°F | Wt 310.1 lb

## 2011-10-18 DIAGNOSIS — M5431 Sciatica, right side: Secondary | ICD-10-CM

## 2011-10-18 DIAGNOSIS — M5430 Sciatica, unspecified side: Secondary | ICD-10-CM

## 2011-10-18 DIAGNOSIS — F411 Generalized anxiety disorder: Secondary | ICD-10-CM

## 2011-10-18 DIAGNOSIS — R7989 Other specified abnormal findings of blood chemistry: Secondary | ICD-10-CM

## 2011-10-18 DIAGNOSIS — R945 Abnormal results of liver function studies: Secondary | ICD-10-CM

## 2011-10-18 DIAGNOSIS — R1013 Epigastric pain: Secondary | ICD-10-CM

## 2011-10-18 LAB — HEPATIC FUNCTION PANEL
ALT: 43 U/L — ABNORMAL HIGH (ref 0–35)
AST: 32 U/L (ref 0–37)
Alkaline Phosphatase: 131 U/L — ABNORMAL HIGH (ref 39–117)
Bilirubin, Direct: 0.2 mg/dL (ref 0.0–0.3)
Total Protein: 7.7 g/dL (ref 6.0–8.3)

## 2011-10-18 MED ORDER — OXYCODONE HCL 10 MG PO TABS: 5.0000 mg | ORAL_TABLET | Freq: Two times a day (BID) | ORAL | Status: DC

## 2011-10-18 MED ORDER — MUPIROCIN CALCIUM 2 % EX CREA: TOPICAL_CREAM | Freq: Two times a day (BID) | CUTANEOUS | Status: DC | PRN

## 2011-10-18 NOTE — Assessment & Plan Note (Signed)
ER visit for RLE numbness and pain  01/2011 and again 06/2011> MRI with free fragment and encroachment on L4 root -  status post R L4-5 microdiscectomy 07/06/2011 > symptoms improved but not resolved Refer to PT and reduce dose oxy as tolerated- reviewed chronic pain mgmt as ongoing

## 2011-10-18 NOTE — Patient Instructions (Signed)
It was good to see you today. Test(s) ordered today. Your results will be called to you after review (48-72hours after test completion). If any changes need to be made, you will be notified at that time. Reduce the dose of oxycodone as discussed to 5mg  2x/day - if needed, can take 3rd dose during day if having bad pain day continue sertraline for depression and nexium  we'll make referral to physical therapy for your back/leg. Our office will contact you regarding appointment(s) once made. Other Medications reviewed, no changes at this time. Please schedule followup in 3-4 months, call sooner if problems.

## 2011-10-18 NOTE — Assessment & Plan Note (Signed)
Chronic BZ - prev klonopin but changed to xanax Also overlap with depression symptoms Resumed sertraline 08/2011 - feels improved -  The current medical regimen is effective;  continue present plan and medications.

## 2011-10-18 NOTE — Progress Notes (Signed)
Subjective:    [name], female    DOB: [birth year], 46 y.o.   MRN: [id number]  HPI Here for follow up - seen in ER last week for epigastric abdominal pain and diarrhea -  labs with increase ALT and alk phos CT ap unremarkable - changed percocet to oxycodone  Also reviewed chronic medical issues:  chronic hypoxic resp failure - home O2 dep since 2010 - overlap with OSA/OHS and obst dz per pulm - BiPAP qhs - reports compliance with ongoing medical treatment and home O2 and no changes in medication dose or frequency. denies adverse side effects related to current therapy. Quit smoking 08/2010!!  HTN - reports compliance with ongoing medical treatment and no changes in medication dose or frequency. denies adverse side effects related to current therapy - prior rash from dilt resolved with change back to amlodipine  dCHF, chronic -last echo IP 01/2011 noted - reports compliance with ongoing medical treatment and no changes in medication dose or frequency. denies adverse side effects related to current therapy. improved edema and dyspnea  - continues bid diuretic - eval by cards for same 02/2011 - to limit fluids <2L/d  morbid obesity - weights reviewed - often has fluid related weight variance but remains obese  Anxiety/depression - chronic hx same with daily BZ use - changed from klonopin to xanax 07/2010 due to "too sleepy" - prev on sertraline, resumed same 07/2011  GERD - reports compliance with ongoing medical treatment and no changes in medication dose or frequency. denies adverse side effects related to current therapy.   dyslipidemia -started on simva spring 2011- reports intermittent compliance with ongoing medical treatment and no changes in medication dose or frequency. denies adverse side effects related to current therapy.   fibromyalgia - chronic pain - on daily narcotics for years -previously reports on "10mg " while in [place] - now on "5" - but note prev hydrocod, now  oxy - pain affects chest, back, legs and "whole body" - resumed flexeril    Past Medical History  Diagnosis Date  . Sleep apnea     noncompliant w/ CPAP  . COPD (chronic obstructive pulmonary disease)     moderate airflow obstruction, suspect d/t emphysema  . Hypertension   . Hyperlipidemia   . Chronic headache   . Fibromyalgia     daily narcotics  . Anxiety     hx chronic BZ use, stopped 07/2010  . Anemia   . Pulmonary sarcoidosis     unimpressive CT chest 2011  . Colonic polyp   . GERD (gastroesophageal reflux disease)   . ALLERGIC RHINITIS   . Asthma   . CHF (congestive heart failure)     Diastolic with fluid overload, May, 2012  . Ejection fraction     EF 60%, echo, May, 2012    Review of Systems  Constitutional: Positive for fatigue. Negative for fever.  Respiratory: Positive for shortness of breath. Negative for cough and wheezing.   Cardiovascular: Negative for chest pain and palpitations.  Neurological: Negative for headaches.       Objective:   Physical Exam  BP 120/62  Pulse 90  Temp(Src) 97.8 F (36.6 C) (Oral)  Wt 310 lb 1.9 oz (140.67 kg)  SpO2 90%  LMP 01/28/2011 Wt Readings from Last 3 Encounters:  10/18/11 310 lb 1.9 oz (140.67 kg)  09/12/11 311 lb 3.2 oz (141.159 kg)  08/30/11 316 lb 12.8 oz (143.7 kg)   Constitutional: She appears well-developed and well-nourished. Morbidly obese.  Cardiovascular: Normal rate, regular rhythm and normal heart sounds.  BLE with trace edema Pulmonary/Chest: Breath sounds diminished bilateral bases but no respiratory distress. She has no wheezes.  Abdomen: obese, S NTND, +BS, no mass or rebound Neurological: She is alert and oriented to person, place, and time. Coordination normal.  Psychiatric: She has a normal mood and affect. Her behavior is normal.    Lab Results  Component Value Date   WBC 8.8 10/11/2011   HGB 14.5 10/11/2011   HCT 43.7 10/11/2011   PLT 226 10/11/2011   CHOL  Value: 189        ATP III  CLASSIFICATION:  <200     mg/dL   Desirable  161-096  mg/dL   Borderline High  >=045    mg/dL   High        01/01/8118   TRIG 89 03/07/2010   HDL 79 03/07/2010   ALT 63* 10/11/2011   AST 37 10/11/2011   NA 134* 10/11/2011   K 3.3* 10/11/2011   CL 92* 10/11/2011   CREATININE 0.95 10/11/2011   BUN 9 10/11/2011   CO2 26 10/11/2011   TSH 0.98 07/24/2011        Assessment & Plan:  See problem list. Medications and labs reviewed today.  Epigastric pain with nausea - increase LFTs last week during ER eval for same - Reviewed CT a/p - ok Suspect element of gastroparesis - reduce narcotic dose now (on higher dose since 06/2011 following back surg for sciatica) GERD improved with change to Nexium 08/2011 (prev omeprazole) recommended to avoid OTC NSAIDS for now If continued LFT abnormality or continued symptoms pain, consider GI eval

## 2011-10-25 ENCOUNTER — Emergency Department (HOSPITAL_COMMUNITY): Payer: Medicare Other

## 2011-10-25 ENCOUNTER — Emergency Department (HOSPITAL_COMMUNITY)
Admission: EM | Admit: 2011-10-25 | Discharge: 2011-10-26 | Disposition: A | Discharge status: Home / Self Care | Payer: Medicare Other | Attending: Emergency Medicine | Admitting: Emergency Medicine

## 2011-10-25 ENCOUNTER — Encounter (HOSPITAL_COMMUNITY): Payer: Self-pay | Admitting: Emergency Medicine

## 2011-10-25 DIAGNOSIS — Z79899 Other long term (current) drug therapy: Secondary | ICD-10-CM | POA: Insufficient documentation

## 2011-10-25 DIAGNOSIS — E785 Hyperlipidemia, unspecified: Secondary | ICD-10-CM | POA: Insufficient documentation

## 2011-10-25 DIAGNOSIS — R062 Wheezing: Secondary | ICD-10-CM | POA: Insufficient documentation

## 2011-10-25 DIAGNOSIS — R0602 Shortness of breath: Secondary | ICD-10-CM | POA: Insufficient documentation

## 2011-10-25 DIAGNOSIS — I10 Essential (primary) hypertension: Secondary | ICD-10-CM | POA: Insufficient documentation

## 2011-10-25 DIAGNOSIS — IMO0001 Reserved for inherently not codable concepts without codable children: Secondary | ICD-10-CM | POA: Insufficient documentation

## 2011-10-25 DIAGNOSIS — R609 Edema, unspecified: Secondary | ICD-10-CM | POA: Insufficient documentation

## 2011-10-25 DIAGNOSIS — J449 Chronic obstructive pulmonary disease, unspecified: Secondary | ICD-10-CM | POA: Insufficient documentation

## 2011-10-25 DIAGNOSIS — R079 Chest pain, unspecified: Secondary | ICD-10-CM | POA: Insufficient documentation

## 2011-10-25 DIAGNOSIS — J4489 Other specified chronic obstructive pulmonary disease: Secondary | ICD-10-CM | POA: Insufficient documentation

## 2011-10-25 LAB — DIFFERENTIAL
Basophils Relative: 1 % (ref 0–1)
Eosinophils Absolute: 0.5 10*3/uL (ref 0.0–0.7)
Eosinophils Relative: 6 % — ABNORMAL HIGH (ref 0–5)
Neutrophils Relative %: 66 % (ref 43–77)

## 2011-10-25 LAB — CBC
MCH: 26.4 pg (ref 26.0–34.0)
MCHC: 33.6 g/dL (ref 30.0–36.0)
MCV: 78.5 fL (ref 78.0–100.0)
Platelets: 196 10*3/uL (ref 150–400)
RDW: 15.6 % — ABNORMAL HIGH (ref 11.5–15.5)

## 2011-10-25 MED ORDER — HYDROMORPHONE HCL PF 1 MG/ML IJ SOLN
1.0000 mg | Freq: Once | INTRAMUSCULAR | Status: AC
  Administered 2011-10-25: 1 mg via INTRAVENOUS
  Filled 2011-10-25: qty 1

## 2011-10-25 MED ORDER — NITROGLYCERIN 2 % TD OINT
1.0000 [in_us] | TOPICAL_OINTMENT | Freq: Once | TRANSDERMAL | Status: AC
  Administered 2011-10-25: 1 [in_us] via TOPICAL
  Filled 2011-10-25: qty 30

## 2011-10-25 MED ORDER — ONDANSETRON HCL 4 MG/2ML IJ SOLN
4.0000 mg | Freq: Once | INTRAMUSCULAR | Status: AC
  Administered 2011-10-25: 4 mg via INTRAVENOUS
  Filled 2011-10-25: qty 2

## 2011-10-25 NOTE — ED Provider Notes (Signed)
History     CSN: [id number]  Arrival date & time 10/25/11  2101   First MD Initiated Contact with Patient 10/25/11 2208      Chief Complaint  Patient presents with  . Shortness of Breath    (Consider location/radiation/quality/duration/timing/severity/associated sxs/prior treatment) HPI Comments: Patient here with worsening shortness of breath over the past two days, states that she feels like she has gained weight and that her legs are swelling, takes demadex for fluid retention and congestive failure, has been using her inhaler more than normal as well, reports symptoms ease while resting, is unable to lie flat and shortness of breath worse than at her baseline.  She states her last admission for CHF was May of last year, reports no chest pain but thinks that her blood pressure is up and that is causing a headache.  Patient is a 46 y.o. female presenting with shortness of breath. The history is provided by the patient. No language interpreter was used.  Shortness of Breath  The current episode started yesterday. The onset was gradual. The problem occurs frequently. The problem has been gradually worsening. The problem is moderate. The symptoms are relieved by rest. The symptoms are aggravated by activity. Associated symptoms include orthopnea, cough and shortness of breath. Pertinent negatives include no chest pain, no chest pressure, no fever, no rhinorrhea, no sore throat, no stridor and no wheezing. There was no intake of a foreign body. She was not exposed to toxic fumes. She has not inhaled smoke recently. She has had intermittent steroid use. She has had prior hospitalizations. She has had no prior ICU admissions. She has had no prior intubations. Urine output has been normal. There were no sick contacts. Recently, medical care has been given by the PCP.    Past Medical History  Diagnosis Date  . Sleep apnea     noncompliant w/ CPAP  . COPD (chronic obstructive pulmonary disease)       moderate airflow obstruction, suspect d/t emphysema  . Hypertension   . Hyperlipidemia   . Chronic headache   . Fibromyalgia     daily narcotics  . Anxiety     hx chronic BZ use, stopped 07/2010  . Anemia   . Pulmonary sarcoidosis     unimpressive CT chest 2011  . Colonic polyp   . GERD (gastroesophageal reflux disease)   . ALLERGIC RHINITIS   . Asthma   . CHF (congestive heart failure)     Diastolic with fluid overload, May, 2012  . Ejection fraction     EF 60%, echo, May, 2012    Past Surgical History  Procedure Date  . Polypectomy 2011  . Lumbar microdiscectomy 07/06/2011    R L4-5, stern    Family History  Problem Relation Age of Onset  . Hypertension Mother   . Emphysema Father   . Hypertension Father   . Stomach cancer Father   . Allergies Brother   . Hypertension Brother   . Stomach cancer Brother   . Heart disease Mother   . Heart disease Father   . Heart disease Brother     History  Substance Use Topics  . Smoking status: Former Smoker -- 1.0 packs/day for 29 years    Quit date: 09/14/2010  . Smokeless tobacco: Not on file  . Alcohol Use: No    OB History    Grav Para Term Preterm Abortions TAB SAB Ect Mult Living  Review of Systems  Constitutional: Negative for fever.  HENT: Negative for sore throat and rhinorrhea.   Respiratory: Positive for cough and shortness of breath. Negative for wheezing and stridor.   Cardiovascular: Positive for orthopnea. Negative for chest pain.  All other systems reviewed and are negative.    Allergies  Azithromycin; Ciprofloxacin; Doxycycline; Latex; and Sulfamethoxazole w/trimethoprim  Home Medications   Current Outpatient Rx  Name Route Sig Dispense Refill  . ALBUTEROL SULFATE (2.5 MG/3ML) 0.083% IN NEBU Nebulization Take 3 mLs (2.5 mg total) by nebulization every 6 (six) hours as needed. 120 vial 3  . ALBUTEROL SULFATE HFA 108 (90 BASE) MCG/ACT IN AERS Inhalation Inhale 2 puffs into  the lungs every 6 (six) hours as needed. 18 g 2  . ALPRAZOLAM 0.5 MG PO TABS  TAKE 1 TABLET BY MOUTH TWICE DAILY 60 tablet 0  . AMLODIPINE BESYLATE 5 MG PO TABS  TAKE 1 TABLET BY MOUTH EVERY DAY 30 tablet 5  . BUDESONIDE-FORMOTEROL FUMARATE 160-4.5 MCG/ACT IN AERO Inhalation Inhale 2 puffs into the lungs 2 (two) times daily.      Marland Kitchen CETIRIZINE HCL 10 MG PO CHEW Oral Chew 10 mg by mouth at bedtime.     Marland Kitchen ESOMEPRAZOLE MAGNESIUM 40 MG PO CPDR Oral Take 1 capsule (40 mg total) by mouth daily. 30 capsule 1  . FLUTICASONE PROPIONATE 50 MCG/ACT NA SUSP Nasal Place 2 sprays into the nose every morning. 16 g 2  . OXYCODONE HCL 10 MG PO TABS Oral Take 0.5 tablets (5 mg total) by mouth 2 (two) times daily. Or as directed/as needed 60 each 0  . POTASSIUM CHLORIDE CRYS ER 20 MEQ PO TBCR Oral Take 20 mEq by mouth 2 (two) times daily.     Marland Kitchen PROMETHAZINE HCL 25 MG PO TABS Oral Take 1 tablet (25 mg total) by mouth every 6 (six) hours as needed for nausea. 30 tablet 0  . SERTRALINE HCL 25 MG PO TABS Oral Take 1 tablet (25 mg total) by mouth daily. 30 tablet 2  . SPIRIVA HANDIHALER 18 MCG IN CAPS  INHALE CONTENTS OF ONE CAPSULE ONCE DAILY USING HANDIHALER 90 capsule 0  . SPIRONOLACTONE 25 MG PO TABS  Take 1 by mouth daily    . TORSEMIDE 100 MG PO TABS Oral Take 100 mg by mouth 2 (two) times daily.      Marland Kitchen MUPIROCIN CALCIUM 2 % EX CREA Topical Apply topically 2 (two) times daily as needed. Apply as twice a day as needed 15 g 0    BP 145/90  Pulse 105  Temp(Src) 98 F (36.7 C) (Oral)  Resp 22  Wt 310 lb (140.615 kg)  SpO2 97%  LMP 01/28/2011  Physical Exam  Nursing note and vitals reviewed. Constitutional: She is oriented to person, place, and time. She appears well-developed and well-nourished. No distress.  HENT:  Head: Normocephalic and atraumatic.  Right Ear: External ear normal.  Left Ear: External ear normal.  Nose: Nose normal.  Mouth/Throat: Oropharynx is clear and moist. No oropharyngeal exudate.   Eyes: Conjunctivae are normal. Pupils are equal, round, and reactive to light. No scleral icterus.  Neck: Normal range of motion. Neck supple. No JVD present.  Cardiovascular: Normal rate, regular rhythm and normal heart sounds.  Exam reveals no gallop and no friction rub.   No murmur heard. Pulmonary/Chest: Effort normal and breath sounds normal. Not tachypneic. No respiratory distress. She has no decreased breath sounds. She has no wheezes. She has no rhonchi.  She has no rales. She exhibits no tenderness.  Abdominal: Soft. Bowel sounds are normal. She exhibits no distension. There is no tenderness.  Musculoskeletal: Normal range of motion. She exhibits edema.       1+ pitting edema  Lymphadenopathy:    She has no cervical adenopathy.  Neurological: She is alert and oriented to person, place, and time. No cranial nerve deficit.  Skin: Skin is warm and dry. No rash noted. No erythema. No pallor.  Psychiatric: She has a normal mood and affect. Her behavior is normal. Judgment and thought content normal.    ED Course  Procedures (including critical care time)   Labs Reviewed  CBC  DIFFERENTIAL  PRO B NATRIURETIC PEPTIDE  BASIC METABOLIC PANEL  URINALYSIS, ROUTINE W REFLEX MICROSCOPIC   Dg Chest 2 View  10/25/2011  *RADIOLOGY REPORT*  Clinical Data: Shortness of breath, chest pain, cough, history smoking, emphysema, sarcoidosis  CHEST - 2 VIEW  Comparison: 07/07/2011  Findings: Enlargement of cardiac silhouette. Slight pulmonary vascular congestion. Mediastinal contours normal. Emphysematous and bronchitic changes. Prominent right hilum stable. Chronic bibasilar atelectasis versus scarring, greater on right. No definite acute infiltrate, pleural effusion or pneumothorax. No acute osseous findings.  IMPRESSION: Emphysematous and chronic bronchitic changes. Chronic bibasilar atelectasis versus scarring, greater on right.  Original Report Authenticated By: Lollie Marrow, M.D.     No  diagnosis found.    MDM  Continues with expiratory wheeze in right lower lung, patient appears to be at her baseline, though she reports worsening shortness of breath and "not feeling good", I will give her an hour long nebulizer and then plan to send her home with follow up with Dr. Felicity Coyer, labs and x-rays are re-assuring.      Patient without wheezing after the hour long nebulizer, agrees with discharge and will follow up with Dr. Felicity Coyer this week to make sure she is improving, she also knows that she may return with any worsening of these symptoms.  Izola Price [place], [place] 10/26/11 [phone]

## 2011-10-25 NOTE — ED Provider Notes (Signed)
46 year old female complains of a cough productive of some greenish sputum and possibly a subjective fever. She has had some myalgias. She's had increasing dyspnea especially with any exertion. There is slight relief with albuterol nebulizer treatment at home. She is also noted some increased extremity edema. On exam, she has definite wheezing on auscultation of the lungs. She has 1+ edema. No rales are noted and there is no neck vein distention. She will need additional albuterol nebulizer treatments and need to be evaluated for possible congestive heart failure.  Dione Booze, MD 10/25/11 [phone]

## 2011-10-25 NOTE — ED Notes (Signed)
Pt alert, c/o sob, onset was yesterday, worse today, pt presents with home O2 @ 3LNC, moist npc, exp wheezes noted,. Skin pwd , denies chest pain

## 2011-10-26 LAB — URINALYSIS, ROUTINE W REFLEX MICROSCOPIC
Hgb urine dipstick: NEGATIVE
Ketones, ur: NEGATIVE mg/dL
Leukocytes, UA: NEGATIVE
Protein, ur: NEGATIVE mg/dL
Urobilinogen, UA: 0.2 mg/dL (ref 0.0–1.0)

## 2011-10-26 LAB — PRO B NATRIURETIC PEPTIDE: Pro B Natriuretic peptide (BNP): 16.2 pg/mL (ref 0–125)

## 2011-10-26 LAB — BASIC METABOLIC PANEL
Calcium: 8.7 mg/dL (ref 8.4–10.5)
Creatinine, Ser: 0.91 mg/dL (ref 0.50–1.10)
GFR calc non Af Amer: 76 mL/min — ABNORMAL LOW (ref >90)
Sodium: 135 mEq/L (ref 135–145)

## 2011-10-26 MED ORDER — METHYLPREDNISOLONE SODIUM SUCC 125 MG IJ SOLR
125.0000 mg | Freq: Once | INTRAMUSCULAR | Status: AC
  Administered 2011-10-26: 125 mg via INTRAVENOUS
  Filled 2011-10-26: qty 2

## 2011-10-26 MED ORDER — IPRATROPIUM BROMIDE 0.02 % IN SOLN
0.5000 mg | Freq: Once | RESPIRATORY_TRACT | Status: AC
  Administered 2011-10-26: 0.5 mg via RESPIRATORY_TRACT
  Filled 2011-10-26: qty 2.5

## 2011-10-26 MED ORDER — PREDNISONE 10 MG PO TABS: 20.0000 mg | ORAL_TABLET | Freq: Every day | ORAL | Status: DC

## 2011-10-26 MED ORDER — ALBUTEROL SULFATE (5 MG/ML) 0.5% IN NEBU
5.0000 mg | INHALATION_SOLUTION | Freq: Once | RESPIRATORY_TRACT | Status: AC
  Administered 2011-10-26: 5 mg via RESPIRATORY_TRACT
  Filled 2011-10-26: qty 1

## 2011-10-26 MED ORDER — ALBUTEROL (5 MG/ML) CONTINUOUS INHALATION SOLN
10.0000 mg/h | INHALATION_SOLUTION | Freq: Once | RESPIRATORY_TRACT | Status: AC
  Administered 2011-10-26: 10 mg/h via RESPIRATORY_TRACT

## 2011-10-26 NOTE — ED Provider Notes (Signed)
Medical screening examination/treatment/procedure(s) were conducted as a shared visit with non-physician practitioner(s) and myself.  I personally evaluated the patient during the encounter   Rushi Chasen, MD 10/26/11 1551 

## 2011-10-27 ENCOUNTER — Encounter: Payer: Self-pay | Admitting: Pulmonary Disease

## 2011-10-27 ENCOUNTER — Other Ambulatory Visit: Payer: Self-pay | Admitting: Pulmonary Disease

## 2011-10-27 ENCOUNTER — Ambulatory Visit (INDEPENDENT_AMBULATORY_CARE_PROVIDER_SITE_OTHER): Payer: Medicare Other | Admitting: Pulmonary Disease

## 2011-10-27 DIAGNOSIS — J9610 Chronic respiratory failure, unspecified whether with hypoxia or hypercapnia: Secondary | ICD-10-CM

## 2011-10-27 DIAGNOSIS — E662 Morbid (severe) obesity with alveolar hypoventilation: Secondary | ICD-10-CM

## 2011-10-27 DIAGNOSIS — J441 Chronic obstructive pulmonary disease with (acute) exacerbation: Secondary | ICD-10-CM | POA: Insufficient documentation

## 2011-10-27 MED ORDER — CEFDINIR 300 MG PO CAPS: 600.0000 mg | ORAL_CAPSULE | Freq: Every day | ORAL | Status: AC

## 2011-10-27 MED ORDER — TORSEMIDE 100 MG PO TABS: 100.0000 mg | ORAL_TABLET | Freq: Two times a day (BID) | ORAL | Status: DC

## 2011-10-27 MED ORDER — PREDNISONE 10 MG PO TABS: ORAL_TABLET | ORAL | Status: DC

## 2011-10-27 NOTE — Telephone Encounter (Signed)
[facility]  Torsemide 100 mg 1 bid #60  x2 Last fill 09-05-2011

## 2011-10-27 NOTE — Assessment & Plan Note (Signed)
The patient is having increased shortness of breath, cough with mucous production, and some wheezing that is most consistent with a COPD exacerbation.  She was treated in the emergency room 2 nights ago, but really was not sent home on adequate prednisone taper.  She has also coughed up purulent mucus since that time but is now turning to gray, and therefore I will treat her with a short course of antibiotics.

## 2011-10-27 NOTE — Patient Instructions (Signed)
Stop taking current prednisone, and change to the taper per your new prescription Increase nexium to am AND pm for next one week, then go back to once a day omnicef as directed for possible infection Keep already scheduled appt with me, but call Monday if you are not improving.  If you worsen, you need to go to ER.

## 2011-10-27 NOTE — Progress Notes (Signed)
  Subjective:    [name], female    DOB: 12-04-1965, 46 y.o.   MRN: [id number]  HPI The patient comes in today for an acute sick visit.  She has known chronic respiratory failure secondary to COPD, obesity hypoventilation, and also diastolic heart failure.  She had been doing fairly well controlled 2 days ago, when she had the onset of shortness of breath and wheezing.  She presented to the emergency room and was treated with IV steroids as well as a continuous nebulizer treatment.  She was discharged home on minimal prednisone taper.  Patient states that she has not gotten any worse, but has not improved over the last 48 hours.  She continues to have cough with purulent mucus initially and now turning gray.  She is having chest congestion, wheezing, and persistent dyspnea on exertion that is greater than her usual baseline.  She has no fevers, chills, or sweats.   Review of Systems  Constitutional: Negative for fever and unexpected weight change.  HENT: Negative for ear pain, nosebleeds, congestion, sore throat, rhinorrhea, sneezing, trouble swallowing, dental problem, postnasal drip and sinus pressure.   Eyes: Negative for redness and itching.  Respiratory: Positive for cough, chest tightness, shortness of breath and wheezing.   Cardiovascular: Negative for palpitations and leg swelling.  Gastrointestinal: Negative for nausea and vomiting.  Genitourinary: Negative for dysuria.  Musculoskeletal: Negative for joint swelling.  Skin: Negative for rash.  Neurological: Positive for headaches.  Hematological: Does not bruise/bleed easily.  Psychiatric/Behavioral: Negative for dysphoric mood. The patient is not nervous/anxious.        Objective:   Physical Exam Morbidly obese female in no acute distress Nose without purulence or discharge noted Oropharynx clear Chest with mildly decreased breath sounds, but no wheezes or rhonchi Cardiac exam with minimally tachycardic but  regular rhythm, 2/6 systolic murmur Lower extremities with mild edema, no cyanosis Alert and oriented, moves all 4 extremities.       Assessment & Plan:

## 2011-10-31 ENCOUNTER — Ambulatory Visit: Payer: Medicare Other

## 2011-11-01 ENCOUNTER — Other Ambulatory Visit: Payer: Self-pay | Admitting: Internal Medicine

## 2011-11-08 ENCOUNTER — Other Ambulatory Visit: Payer: Self-pay | Admitting: Internal Medicine

## 2011-11-09 NOTE — Telephone Encounter (Signed)
Faxed script back to [facility] @ [phone]... 11/09/11@9 :52am/LMB

## 2011-11-14 ENCOUNTER — Ambulatory Visit: Payer: Medicare Other | Attending: Internal Medicine

## 2011-11-15 ENCOUNTER — Encounter: Payer: Self-pay | Admitting: Pulmonary Disease

## 2011-11-15 ENCOUNTER — Ambulatory Visit (INDEPENDENT_AMBULATORY_CARE_PROVIDER_SITE_OTHER): Payer: Medicare Other | Admitting: Pulmonary Disease

## 2011-11-15 ENCOUNTER — Telehealth: Payer: Self-pay | Admitting: Critical Care Medicine

## 2011-11-15 DIAGNOSIS — I509 Heart failure, unspecified: Secondary | ICD-10-CM

## 2011-11-15 DIAGNOSIS — J4489 Other specified chronic obstructive pulmonary disease: Secondary | ICD-10-CM

## 2011-11-15 DIAGNOSIS — J449 Chronic obstructive pulmonary disease, unspecified: Secondary | ICD-10-CM

## 2011-11-15 DIAGNOSIS — I5032 Chronic diastolic (congestive) heart failure: Secondary | ICD-10-CM

## 2011-11-15 DIAGNOSIS — E662 Morbid (severe) obesity with alveolar hypoventilation: Secondary | ICD-10-CM

## 2011-11-15 DIAGNOSIS — J9610 Chronic respiratory failure, unspecified whether with hypoxia or hypercapnia: Secondary | ICD-10-CM

## 2011-11-15 MED ORDER — TIOTROPIUM BROMIDE MONOHYDRATE 18 MCG IN CAPS: 18.0000 ug | ORAL_CAPSULE | Freq: Every day | RESPIRATORY_TRACT | Status: DC

## 2011-11-15 NOTE — Assessment & Plan Note (Signed)
The patient states that she has been wearing bilevel compliantly, and reports no issues with her mask or pressure.

## 2011-11-15 NOTE — Progress Notes (Signed)
  Subjective:    [name], female    DOB: [birth year], 46 y.o.   MRN: [id number]  HPI The patient comes in today for an acute sick visit.  She has known chronic respiratory failure secondary to obesity hypoventilation syndrome, COPD, and chronic diastolic heart failure.  She was seen the beginning of this month with an episode of what sounded like acute asthmatic bronchitis.  Despite treatment, she never felt that she got back to her usual baseline.  She recently had worsening of her chest congestion and shortness of breath, and went to an urgent care.  They treated her with a Z-Pak as well as a prednisone taper that she has been on for a few days.  She does not feel that her breathing is getting any better, and has noticed that she is taking on a large amount of fluid.  Her weight is actually up 7-8 pounds from her last visit here.  She feels some chest tightness, and has a cough that is productive only of thick white mucous.  She has not had any fevers, chills, or sweats.  She has not had any chest pain.   Review of Systems  Constitutional: Positive for chills and diaphoresis. Negative for fever and unexpected weight change.  HENT: Positive for voice change and postnasal drip. Negative for ear pain, nosebleeds, congestion, sore throat, rhinorrhea, sneezing, trouble swallowing, dental problem and sinus pressure.   Eyes: Negative for itching.  Respiratory: Positive for cough, chest tightness and shortness of breath. Negative for wheezing.   Cardiovascular: Negative for palpitations and leg swelling.  Gastrointestinal: Positive for nausea and vomiting.  Genitourinary: Negative for dysuria.  Musculoskeletal: Negative for joint swelling.  Skin: Negative for rash.  Neurological: Positive for headaches.  Hematological: Does not bruise/bleed easily.  Psychiatric/Behavioral: Negative for dysphoric mood. The patient is not nervous/anxious.        Objective:   Physical Exam Morbidly  obese female in no acute distress Nose without purulence or discharge noted No skin breakdown or pressure necrosis from the CPAP mask Chest with mild decrease in breath sounds, however adequate airflow.  No active wheezing noted Cardiac exam with regular rate and rhythm Lower extremities with 1-2+ pedal edema, no cyanosis Alert and oriented, moves all 4 extremities.       Assessment & Plan:

## 2011-11-15 NOTE — Telephone Encounter (Signed)
Noted  

## 2011-11-15 NOTE — Assessment & Plan Note (Signed)
The patient has had increasing shortness of breath despite good treatment of her COPD flare.  She has gained at least 7-8 pounds since her last visit here, and tells me that she is up 10 pounds by her scale over the last one week.  She unfortunately has been eating a lot of chicken noodles soup loaded with sodium.  I wonder if her shortness of breath issue is due to fluid overload at this point.  She does have worsening lower extremity edema.  I think we need to increase her diuretic dose for the next 3-4 days, and see if this will help with her symptoms.

## 2011-11-15 NOTE — Telephone Encounter (Signed)
Call from patient reporting that she had received steroids and antibiotics from Dr. Shelle Iron earlier in the month and had completed the course last Wednesday.  She states she has not improved with ongoing cough and thick mucus production.  She went to an urgent care today and received a breathing treatment, steroids, and a Z pack.  The patient denies chest pain, wheezing, excessive SOB, F/C/N/V.  She has had a productive cough of white phlegm but it is now thick and sticking in her throat.  She states the secretions have thinned some.  During the phone conversation the patient can speak in complete sentences.  She sounds congested.  At this point I have recommended that she continue her current treatment.  She is to contact the [facility] office in the AM for re-evaluation.

## 2011-11-15 NOTE — Patient Instructions (Addendum)
Finish up your antibiotics and prednisone taper as written on the package. Will need to increase your fluid medication (torsemide) to 1 1/2 in the am and one in the pm for THURS/FRI/SAT/SUN, then go back to your usual dose of one in am and pm.  Weigh every am, and let us know if you are not losing fluid weight.  followup with me the middle of next week so we can see how you are doing.

## 2011-11-15 NOTE — Assessment & Plan Note (Signed)
The patient is just getting over what sounds like a recent episode of acute asthmatic bronchitis.  She is taking a Z-Pak and prednisone, and I have asked her to continue this.  She is actually moving air very well today, with no wheezing noted.  Her oxygen saturations are excellent as well.

## 2011-11-17 ENCOUNTER — Encounter (HOSPITAL_COMMUNITY): Payer: Self-pay | Admitting: *Deleted

## 2011-11-17 ENCOUNTER — Emergency Department (HOSPITAL_COMMUNITY): Payer: Medicare Other

## 2011-11-17 ENCOUNTER — Telehealth: Payer: Self-pay | Admitting: *Deleted

## 2011-11-17 ENCOUNTER — Other Ambulatory Visit: Payer: Self-pay

## 2011-11-17 ENCOUNTER — Emergency Department (HOSPITAL_COMMUNITY)
Admission: EM | Admit: 2011-11-17 | Discharge: 2011-11-17 | Disposition: A | Discharge status: Home / Self Care | Payer: Medicare Other | Attending: Emergency Medicine | Admitting: Emergency Medicine

## 2011-11-17 DIAGNOSIS — R059 Cough, unspecified: Secondary | ICD-10-CM | POA: Insufficient documentation

## 2011-11-17 DIAGNOSIS — F411 Generalized anxiety disorder: Secondary | ICD-10-CM | POA: Insufficient documentation

## 2011-11-17 DIAGNOSIS — I509 Heart failure, unspecified: Secondary | ICD-10-CM | POA: Insufficient documentation

## 2011-11-17 DIAGNOSIS — IMO0001 Reserved for inherently not codable concepts without codable children: Secondary | ICD-10-CM | POA: Insufficient documentation

## 2011-11-17 DIAGNOSIS — Z87891 Personal history of nicotine dependence: Secondary | ICD-10-CM | POA: Insufficient documentation

## 2011-11-17 DIAGNOSIS — Z6841 Body Mass Index (BMI) 40.0 and over, adult: Secondary | ICD-10-CM | POA: Insufficient documentation

## 2011-11-17 DIAGNOSIS — J4489 Other specified chronic obstructive pulmonary disease: Secondary | ICD-10-CM | POA: Insufficient documentation

## 2011-11-17 DIAGNOSIS — R0601 Orthopnea: Secondary | ICD-10-CM | POA: Insufficient documentation

## 2011-11-17 DIAGNOSIS — J209 Acute bronchitis, unspecified: Secondary | ICD-10-CM

## 2011-11-17 DIAGNOSIS — K219 Gastro-esophageal reflux disease without esophagitis: Secondary | ICD-10-CM | POA: Insufficient documentation

## 2011-11-17 DIAGNOSIS — I10 Essential (primary) hypertension: Secondary | ICD-10-CM | POA: Insufficient documentation

## 2011-11-17 DIAGNOSIS — J449 Chronic obstructive pulmonary disease, unspecified: Secondary | ICD-10-CM | POA: Insufficient documentation

## 2011-11-17 DIAGNOSIS — R51 Headache: Secondary | ICD-10-CM | POA: Insufficient documentation

## 2011-11-17 DIAGNOSIS — R0602 Shortness of breath: Secondary | ICD-10-CM | POA: Insufficient documentation

## 2011-11-17 DIAGNOSIS — E785 Hyperlipidemia, unspecified: Secondary | ICD-10-CM | POA: Insufficient documentation

## 2011-11-17 DIAGNOSIS — G4730 Sleep apnea, unspecified: Secondary | ICD-10-CM | POA: Insufficient documentation

## 2011-11-17 DIAGNOSIS — R05 Cough: Secondary | ICD-10-CM | POA: Insufficient documentation

## 2011-11-17 HISTORY — DX: Morbid (severe) obesity due to excess calories: E66.01

## 2011-11-17 LAB — PRO B NATRIURETIC PEPTIDE: Pro B Natriuretic peptide (BNP): 26.6 pg/mL (ref 0–125)

## 2011-11-17 LAB — COMPREHENSIVE METABOLIC PANEL
ALT: 58 U/L — ABNORMAL HIGH (ref 0–35)
AST: 37 U/L (ref 0–37)
Albumin: 3.8 g/dL (ref 3.5–5.2)
Alkaline Phosphatase: 167 U/L — ABNORMAL HIGH (ref 39–117)
Glucose, Bld: 336 mg/dL — ABNORMAL HIGH (ref 70–99)
Potassium: 3.6 mEq/L (ref 3.5–5.1)
Sodium: 131 mEq/L — ABNORMAL LOW (ref 135–145)
Total Protein: 8.3 g/dL (ref 6.0–8.3)

## 2011-11-17 LAB — CBC
Hemoglobin: 14.8 g/dL (ref 12.0–15.0)
MCHC: 33.9 g/dL (ref 30.0–36.0)
Platelets: 218 10*3/uL (ref 150–400)
RDW: 16.9 % — ABNORMAL HIGH (ref 11.5–15.5)

## 2011-11-17 LAB — TROPONIN I: Troponin I: <0.3 ng/mL (ref <0.30)

## 2011-11-17 MED ORDER — SODIUM CHLORIDE 0.9 % IJ SOLN: 3.0000 mL | Freq: Two times a day (BID) | INTRAMUSCULAR | Status: DC

## 2011-11-17 MED ORDER — SODIUM CHLORIDE 0.9 % IJ SOLN: 3.0000 mL | INTRAMUSCULAR | Status: DC | PRN

## 2011-11-17 MED ORDER — INSULIN ASPART 100 UNIT/ML ~~LOC~~ SOLN
6.0000 [IU] | Freq: Once | SUBCUTANEOUS | Status: AC
  Administered 2011-11-17: 6 [IU] via SUBCUTANEOUS
  Filled 2011-11-17: qty 1

## 2011-11-17 MED ORDER — SODIUM CHLORIDE 0.9 % IV SOLN: 250.0000 mL | INTRAVENOUS | Status: DC | PRN

## 2011-11-17 NOTE — Telephone Encounter (Signed)
Pt states she has been sick went to Er on yesterday was told BS was 336 they gave her 6 units of insulin and told to f/u with md. Pt states she is schedule for 3 month f/u on 11-29-11 wanting to move appt up. Transferred to schedulers to move appt... 11/17/11@1 :46pm/LMB

## 2011-11-17 NOTE — Discharge Instructions (Signed)
Acute Bronchitis You have acute bronchitis. This means you have a chest cold. The airways in your lungs are red and sore (inflamed). Acute means it is sudden onset.  CAUSES Bronchitis is most often caused by the same virus that causes a cold. SYMPTOMS   Body aches.   Chest congestion.   Chills.   Cough.   Fever.   Shortness of breath.   Sore throat.  TREATMENT  Acute bronchitis is usually treated with rest, fluids, and medicines for relief of fever or cough. Most symptoms should go away after a few days or a week. Increased fluids may help thin your secretions and will prevent dehydration. Your caregiver may give you an inhaler to improve your symptoms. The inhaler reduces shortness of breath and helps control cough. You can take over-the-counter pain relievers or cough medicine to decrease coughing, pain, or fever. A cool-air vaporizer may help thin bronchial secretions and make it easier to clear your chest. Antibiotics are usually not needed but can be prescribed if you smoke, are seriously ill, have chronic lung problems, are elderly, or you are at higher risk for developing complications.Allergies and asthma can make bronchitis worse. Repeated episodes of bronchitis may cause longstanding lung problems. Avoid smoking and secondhand smoke.Exposure to cigarette smoke or irritating chemicals will make bronchitis worse. If you are a cigarette smoker, consider using nicotine gum or skin patches to help control withdrawal symptoms. Quitting smoking will help your lungs heal faster. Recovery from bronchitis is often slow, but you should start feeling better after 2 to 3 days. Cough from bronchitis frequently lasts for 3 to 4 weeks. To prevent another bout of acute bronchitis:  Quit smoking.   Wash your hands frequently to get rid of viruses or use a hand sanitizer.   Avoid other people with cold or virus symptoms.   Try not to touch your hands to your mouth, nose, or eyes.  SEEK  IMMEDIATE MEDICAL CARE IF:  You develop increased fever, chills, or chest pain.   You have severe shortness of breath or bloody sputum.   You develop dehydration, fainting, repeated vomiting, or a severe headache.   You have no improvement after 1 week of treatment or you get worse.  MAKE SURE YOU:   Understand these instructions.   Will watch your condition.   Will get help right away if you are not doing well or get worse.  Document Released: 10/19/2004 Document Revised: 05/24/2011 Document Reviewed: 01/04/2011 [facility] Patient Information 2012 [place], [place].Chronic Obstructive Pulmonary Disease Chronic obstructive pulmonary disease (COPD) is a condition in which airflow from the lungs is restricted. The lungs can never return to normal, but there are measures you can take which will improve them and make you feel better. CAUSES   Smoking.   Exposure to secondhand smoke.   Breathing in irritants (pollution, cigarette smoke, strong smells, aerosol sprays, paint fumes).   History of lung infections.  TREATMENT  Treatment focuses on making you comfortable (supportive care). Your caregiver may prescribe medications (inhaled or pills) to help improve your breathing. HOME CARE INSTRUCTIONS   If you smoke, stop smoking.   Avoid exposure to smoke, chemicals, and fumes that aggravate your breathing.   Take antibiotic medicines as directed by your caregiver.   Avoid medicines that dry up your system and slow down the elimination of secretions (antihistamines and cough syrups). This decreases respiratory capacity and may lead to infections.   Drink enough water and fluids to keep your urine clear or  pale yellow. This loosens secretions.   Use humidifiers at home and at your bedside if they do not make breathing difficult.   Receive all protective vaccines your caregiver suggests, especially pneumococcal and influenza.   Use home oxygen as suggested.   Stay active. Exercise  and physical activity will help maintain your ability to do things you want to do.   Eat a healthy diet.  SEEK MEDICAL CARE IF:   You develop pus-like mucus (sputum).   Breathing is more labored or exercise becomes difficult to do.   You are running out of the medicine you take for your breathing.  SEEK IMMEDIATE MEDICAL CARE IF:   You have a rapid heart rate.   You have agitation, confusion, tremors, or are in a stupor (family members may need to observe this).   It becomes difficult to breathe.   You develop chest pain.   You have a fever.  MAKE SURE YOU:   Understand these instructions.   Will watch your condition.   Will get help right away if you are not doing well or get worse.  Document Released: 06/21/2005 Document Revised: 05/24/2011 Document Reviewed: 11/11/2010 [facility] Patient Information 2012 [place], [place].

## 2011-11-17 NOTE — ED Provider Notes (Signed)
History     CSN: [id number]  Arrival date & time 11/17/11  0024   First MD Initiated Contact with Patient 11/17/11 0045      Chief Complaint  Patient presents with  . Shortness of Breath    (Consider location/radiation/quality/duration/timing/severity/associated sxs/prior treatment) Patient is a 46 y.o. female presenting with shortness of breath. The history is provided by the patient.  Shortness of Breath  The current episode started 2 days ago. The onset was gradual. The problem occurs occasionally. The problem has been resolved. The problem is mild. Relieved by: Sitting upright. The symptoms are aggravated by a supine position (Exertion). Associated symptoms include orthopnea, cough and shortness of breath. Pertinent negatives include no chest pain, no chest pressure, no fever, no rhinorrhea, no sore throat, no stridor and no wheezing. There was no intake of a foreign body. She was not exposed to toxic fumes. She has not inhaled smoke recently. She is currently using steroids. Past medical history comments: COPD, CHF. She has been behaving normally. Urine output has been normal. The last void occurred less than 6 hours ago. There were no sick contacts. Recently, medical care has been given by the PCP. Services received include medications given.    Past Medical History  Diagnosis Date  . Sleep apnea     noncompliant w/ CPAP  . COPD (chronic obstructive pulmonary disease)     moderate airflow obstruction, suspect d/t emphysema  . Hypertension   . Hyperlipidemia   . Chronic headache   . Fibromyalgia     daily narcotics  . Anxiety     hx chronic BZ use, stopped 07/2010  . Anemia   . Pulmonary sarcoidosis     unimpressive CT chest 2011  . Colonic polyp   . GERD (gastroesophageal reflux disease)   . ALLERGIC RHINITIS   . Asthma   . CHF (congestive heart failure)     Diastolic with fluid overload, May, 2012  . Ejection fraction     EF 60%, echo, May, 2012  . Morbid obesity       Past Surgical History  Procedure Date  . Polypectomy 2011  . Lumbar microdiscectomy 07/06/2011    R L4-5, stern    Family History  Problem Relation Age of Onset  . Hypertension Mother   . Emphysema Father   . Hypertension Father   . Stomach cancer Father   . Allergies Brother   . Hypertension Brother   . Stomach cancer Brother   . Heart disease Mother   . Heart disease Father   . Heart disease Brother     History  Substance Use Topics  . Smoking status: Former Smoker -- 1.0 packs/day for 29 years    Types: Cigarettes    Quit date: 09/14/2010  . Smokeless tobacco: Not on file  . Alcohol Use: No    OB History    Grav Para Term Preterm Abortions TAB SAB Ect Mult Living                  Review of Systems  Constitutional: Negative for fever, chills, activity change, appetite change, fatigue and unexpected weight change.  HENT: Negative for congestion, sore throat, facial swelling and rhinorrhea.   Eyes: Negative.   Respiratory: Positive for cough and shortness of breath. Negative for chest tightness, wheezing and stridor.   Cardiovascular: Positive for orthopnea. Negative for chest pain, palpitations and leg swelling.  Gastrointestinal: Negative for nausea, vomiting, abdominal pain and abdominal distention.  Genitourinary: Negative for  dysuria, urgency, frequency and hematuria.  Musculoskeletal: Negative for back pain and joint swelling.  Skin: Negative for color change and rash.  Neurological: Negative for dizziness, syncope, numbness and headaches.  Hematological: Negative for adenopathy.  Psychiatric/Behavioral: Negative.     Allergies  Azithromycin; Ciprofloxacin; Doxycycline; Latex; and Sulfamethoxazole w/trimethoprim  Home Medications   Current Outpatient Rx  Name Route Sig Dispense Refill  . ALBUTEROL SULFATE HFA 108 (90 BASE) MCG/ACT IN AERS Inhalation Inhale 2 puffs into the lungs every 6 (six) hours as needed. For wheeze or shortness of breath     . ALBUTEROL SULFATE (2.5 MG/3ML) 0.083% IN NEBU Nebulization Take 2.5 mg by nebulization every 6 (six) hours as needed. For wheeze or shortness of breath    . ALPRAZOLAM 0.5 MG PO TABS  TAKE 1 TABLET BY MOUTH TWICE DAILY 60 tablet 0  . AMLODIPINE BESYLATE 5 MG PO TABS  TAKE 1 TABLET BY MOUTH EVERY DAY 30 tablet 5  . AZITHROMYCIN 250 MG PO TABS Oral Take 250 mg by mouth daily.    . BUDESONIDE-FORMOTEROL FUMARATE 160-4.5 MCG/ACT IN AERO Inhalation Inhale 2 puffs into the lungs 2 (two) times daily.      Marland Kitchen CETIRIZINE HCL 10 MG PO CHEW Oral Chew 10 mg by mouth at bedtime.     Marland Kitchen FLUTICASONE PROPIONATE 50 MCG/ACT NA SUSP Nasal Place 2 sprays into the nose every morning. 16 g 2  . HYDROCODONE-HOMATROPINE 5-1.5 MG/5ML PO SYRP Oral Take 5 mLs by mouth every 6 (six) hours as needed. For cough    . MUPIROCIN CALCIUM 2 % EX CREA Topical Apply topically 2 (two) times daily as needed. Apply as twice a day as needed 15 g 0  . NEXIUM 40 MG PO CPDR  TAKE ONE CAPSULE BY MOUTH DAILY 30 capsule 5  . OXYCODONE HCL 10 MG PO TABS Oral Take 0.5 tablets (5 mg total) by mouth 2 (two) times daily. Or as directed/as needed 60 each 0  . POTASSIUM CHLORIDE CRYS ER 20 MEQ PO TBCR Oral Take 20 mEq by mouth 2 (two) times daily.     Marland Kitchen PREDNISONE 10 MG PO TABS  Taper as directed for 12 days.    Marland Kitchen PROMETHAZINE HCL 25 MG PO TABS Oral Take 1 tablet (25 mg total) by mouth every 6 (six) hours as needed for nausea. 30 tablet 0  . SERTRALINE HCL 25 MG PO TABS Oral Take 1 tablet (25 mg total) by mouth daily. 30 tablet 2  . SPIRONOLACTONE 25 MG PO TABS  Take 1 by mouth daily    . TIOTROPIUM BROMIDE MONOHYDRATE 18 MCG IN CAPS Inhalation Place 1 capsule (18 mcg total) into inhaler and inhale daily. 30 capsule 5  . TORSEMIDE 100 MG PO TABS Oral Take 1 tablet (100 mg total) by mouth 2 (two) times daily. 60 tablet 2    BP 135/79  Pulse 88  Temp(Src) 98.5 F (36.9 C) (Oral)  Ht 5\' 8"  (1.727 m)  Wt 317 lb (143.79 kg)  BMI 48.20 kg/m2   SpO2 94%  LMP 01/28/2011  Physical Exam  Nursing note and vitals reviewed. Constitutional: She is oriented to person, place, and time. She appears well-nourished. No distress.       Morbidly obese  HENT:  Head: Normocephalic and atraumatic.  Right Ear: External ear normal.  Left Ear: External ear normal.  Nose: Nose normal.  Mouth/Throat: Oropharynx is clear and moist. No oropharyngeal exudate.  Eyes: Conjunctivae and EOM are normal. Pupils are equal,  round, and reactive to light.  Neck: Normal range of motion. Neck supple. No JVD present. No tracheal deviation present.  Cardiovascular: Normal rate, regular rhythm, normal heart sounds and intact distal pulses.  Exam reveals no gallop and no friction rub.   No murmur heard. Pulmonary/Chest: Effort normal and breath sounds normal. No accessory muscle usage or stridor. Not tachypneic. No respiratory distress. She has no decreased breath sounds. She has no wheezes. She has no rhonchi. She has no rales. She exhibits no tenderness.       Good air exchange in all fields, no wheezes, rales, or rhonchi and the patient is in no respiratory distress while laying in a semi-fowler's position.  Abdominal: Soft. Bowel sounds are normal. She exhibits no distension. There is no tenderness. There is no rebound and no guarding.  Musculoskeletal: Normal range of motion. She exhibits no edema and no tenderness.  Lymphadenopathy:    She has no cervical adenopathy.  Neurological: She is alert and oriented to person, place, and time. She has normal reflexes. No cranial nerve deficit. She exhibits normal muscle tone. Coordination normal.  Skin: Skin is warm and dry. No rash noted. She is not diaphoretic. No erythema. No pallor.  Psychiatric: She has a normal mood and affect. Her behavior is normal. Judgment and thought content normal.    ED Course  Procedures (including critical care time)  Date: 11/17/2011  Rate: 92  Rhythm: normal sinus rhythm  QRS Axis:  normal  Intervals: normal  ST/T Wave abnormalities: normal  Conduction Disutrbances:none  Narrative Interpretation:   Old EKG Reviewed: none available   Labs Reviewed  CBC - Abnormal; Notable for the following:    WBC 11.9 (*)    RBC 5.53 (*)    RDW 16.9 (*)    All other components within normal limits  COMPREHENSIVE METABOLIC PANEL - Abnormal; Notable for the following:    Sodium 131 (*)    Chloride 88 (*)    Glucose, Bld 336 (*)    ALT 58 (*)    Alkaline Phosphatase 167 (*)    GFR calc non Af Amer 75 (*)    GFR calc Af Amer 87 (*)    All other components within normal limits  PRO B NATRIURETIC PEPTIDE  TROPONIN I   Dg Chest 2 View  11/17/2011  *RADIOLOGY REPORT*  Clinical Data: Cough and shortness of breath.  CHEST - 2 VIEW  Comparison: 10/25/2011.  Findings: The cardiac silhouette remains borderline enlarged.  No significant change in prominence of the interstitial markings with linear densities in both lower lung zones.  Stable mild diffuse peribronchial thickening.  Unremarkable bones.  IMPRESSION: Stable borderline cardiomegaly and chronic interstitial lung disease with bibasilar scarring.  Original Report Authenticated By: Darrol Angel, M.D.     No diagnosis found.    MDM  The differential diagnosis includes congestive heart failure, myocardial infarction, anemia, electrolyte abnormality, pulmonary edema, pneumonia, COPD exacerbation, arrhythmia amongst other potential etiologies of the patient's symptoms.  Review of the patient's laboratory studies and x-ray revealed no pulmonary edema, no congestive heart failure, no myocardial infarction, no anemia, no electrolyte abnormality, no arrhythmia. The patient is in no apparent respiratory distress and laying supine at the time of exam. The patient may be dealing with a bronchitis which is being treated by her primary care physician as an outpatient with now a second round of antibiotics as well as corticosteroids and  bronchodilators. The patient is stable and will be discharged home for  outpatient followup as needed.        Felisa Bonier, MD 11/17/11 [phone]

## 2011-11-17 NOTE — ED Notes (Signed)
Pt is here with increasing sob.  Pt was placed on steroids and antibiotics earlier this month without success.  Pt was placed on a additional round of antibiotics yesterday and her MD increased her furosemide since pt gained 10lbs of fluids between Monday and wednesday.  Pt states that she can not lay down bc it makes her feel likes heis drowning.  Pt appears sob in triage, able to speak in full sentences.  Pt is on O2 [place] at home.  No CP with this.  Pt reports that her cough is dry.  Pt reports chills

## 2011-11-17 NOTE — ED Notes (Signed)
Pt states she is being tx by pmd with antibiotics and steroids.  She feels her sob is increasing when she lays down.  Diffuse rhonci throughout lungs.  VS stable.  No BIL edema.

## 2011-11-17 NOTE — ED Notes (Signed)
PT back from X-ray  

## 2011-11-21 ENCOUNTER — Ambulatory Visit: Payer: Medicare Other | Admitting: Pulmonary Disease

## 2011-11-21 ENCOUNTER — Encounter: Payer: Self-pay | Admitting: Internal Medicine

## 2011-11-21 ENCOUNTER — Ambulatory Visit (INDEPENDENT_AMBULATORY_CARE_PROVIDER_SITE_OTHER): Payer: Medicare Other | Admitting: Internal Medicine

## 2011-11-21 VITALS — BP 132/92 | HR 86 | Temp 97.0°F | Ht 69.0 in | Wt 307.0 lb

## 2011-11-21 DIAGNOSIS — R739 Hyperglycemia, unspecified: Secondary | ICD-10-CM

## 2011-11-21 DIAGNOSIS — I509 Heart failure, unspecified: Secondary | ICD-10-CM

## 2011-11-21 DIAGNOSIS — IMO0001 Reserved for inherently not codable concepts without codable children: Secondary | ICD-10-CM

## 2011-11-21 DIAGNOSIS — R7309 Other abnormal glucose: Secondary | ICD-10-CM

## 2011-11-21 DIAGNOSIS — I5032 Chronic diastolic (congestive) heart failure: Secondary | ICD-10-CM

## 2011-11-21 MED ORDER — OXYCODONE HCL 10 MG PO TABS: 5.0000 mg | ORAL_TABLET | Freq: Two times a day (BID) | ORAL | Status: DC

## 2011-11-21 MED ORDER — FLUTICASONE PROPIONATE 50 MCG/ACT NA SUSP: 2.0000 | NASAL | Status: DC

## 2011-11-21 MED ORDER — CETIRIZINE HCL 10 MG PO CHEW: 10.0000 mg | CHEWABLE_TABLET | Freq: Every day | ORAL | Status: DC

## 2011-11-21 NOTE — Assessment & Plan Note (Signed)
Pt reports interest in lap band - to follow up with bariatric meeting seminars - encouraged to work on diet and exertion efforts as tolerated until then  Wt Readings from Last 3 Encounters:  11/21/11 307 lb (139.254 kg)  11/17/11 310 lb (140.615 kg)  11/15/11 317 lb (143.79 kg)

## 2011-11-21 NOTE — Patient Instructions (Signed)
It was good to see you today. We have reviewed your prior records including labs and tests today - plan to check for diabetes test at next visit with labs Continue to monitor your weight and call if increased more than 3 pounds in 24 hours Continue to monitor sodium in your diet - keep to low sodium!! Medications reviewed, no changes at this time. Refill on medication(s) as discussed today. Please schedule followup in 3-4 months, call sooner if problems.

## 2011-11-21 NOTE — Assessment & Plan Note (Signed)
Acute exac last week - treated with increase diuretic x 4 days by pulm -  now improved breathing with subsequent decrease in weight euvolemic - advised to monitor sodium intake and monitor weights at home - call if increase weight >3#/day

## 2011-11-21 NOTE — Progress Notes (Signed)
Subjective:    [name], female    DOB: Oct 31, 1965, 46 y.o.   MRN: [id number]  HPI Here for follow up - ER visit 2/22 for bronchitis -  S/p tx for volume overload by pulm 2/20 Feels breathing has much improved, weight down 10#  also reviewed chronic medical issues:  chronic hypoxic resp failure - home O2 dep since 2010 - overlap with OSA/OHS and obst dz per pulm - BiPAP qhs - reports compliance with ongoing medical treatment and home O2 and no changes in medication dose or frequency. denies adverse side effects related to current therapy. Quit smoking 08/2010!!  HTN - reports compliance with ongoing medical treatment and no changes in medication dose or frequency. denies adverse side effects related to current therapy - prior rash from dilt resolved with change back to amlodipine  dCHF, chronic - IP echo 01/2011 reviewed: normal LVEF - reports compliance with ongoing medical treatment. denies adverse side effects related to current therapy. improved edema and dyspnea  - continues bid diuretic - eval by cards for same 02/2011 - to limit fluids <2L/d  morbid obesity - weights reviewed - often has fluid related weight variance but remains obese  Anxiety/depression - chronic hx same with daily BZ use - changed from klonopin to xanax 07/2010 due to "too sleepy" - prev on sertraline, would like to resume same  GERD - reports compliance with ongoing medical treatment and no changes in medication dose or frequency. denies adverse side effects related to current therapy.   dyslipidemia -started on simva spring 2011- reports intermittent compliance with ongoing medical treatment and no changes in medication dose or frequency. denies adverse side effects related to current therapy.   fibromyalgia - chronic pain - on daily narcotics for years - would like to reduce dose if possible (reports on "10mg " while in [place] - now on "5" - but note prev hydrocod, now oxy) - pain affects chest, back,  legs and "whole body" - resumed flexeril    Past Medical History  Diagnosis Date  . Sleep apnea     noncompliant w/ CPAP  . COPD (chronic obstructive pulmonary disease)     moderate airflow obstruction, suspect d/t emphysema  . Hypertension   . Hyperlipidemia   . Chronic headache   . Fibromyalgia     daily narcotics  . Anxiety     hx chronic BZ use, stopped 07/2010  . Anemia   . Pulmonary sarcoidosis     unimpressive CT chest 2011  . Colonic polyp   . GERD (gastroesophageal reflux disease)   . ALLERGIC RHINITIS   . Asthma   . CHF (congestive heart failure)     Diastolic with fluid overload, May, 2012, LVEF 60%  . Morbid obesity     Review of Systems  Constitutional: Positive for fatigue. Negative for fever and unexpected weight change.  Respiratory: Negative for cough, shortness of breath and wheezing.   Cardiovascular: Negative for chest pain, palpitations and leg swelling.  Neurological: Negative for headaches.       Objective:   Physical Exam  BP 132/92  Pulse 86  Temp(Src) 97 F (36.1 C) (Oral)  Ht 5\' 9"  (1.753 m)  Wt 307 lb (139.254 kg)  BMI 45.34 kg/m2  SpO2 92%  LMP 01/28/2011 Wt Readings from Last 3 Encounters:  11/21/11 307 lb (139.254 kg)  11/17/11 310 lb (140.615 kg)  11/15/11 317 lb (143.79 kg)   Constitutional: She appears well-developed and well-nourished. Morbidly obese.  Cardiovascular:  Normal rate, regular rhythm and normal heart sounds.  BLE with trace edema Pulmonary/Chest: Breath sounds diminished bilateral bases but no respiratory distress. She has no wheezes.  Neurological: She is alert and oriented to person, place, and time. Coordination normal.  Psychiatric: She has a normal mood and affect. Her behavior is normal.    Lab Results  Component Value Date   WBC 11.9* 11/17/2011   HGB 14.8 11/17/2011   HCT 43.6 11/17/2011   PLT 218 11/17/2011   CHOL  Value: 189        ATP III CLASSIFICATION:  <200     mg/dL   Desirable  478-295  mg/dL    Borderline High  >=621    mg/dL   High        11/30/6576   TRIG 89 03/07/2010   HDL 79 03/07/2010   ALT 58* 11/17/2011   AST 37 11/17/2011   NA 131* 11/17/2011   K 3.6 11/17/2011   CL 88* 11/17/2011   CREATININE 0.92 11/17/2011   BUN 15 11/17/2011   CO2 30 11/17/2011   TSH 0.98 07/24/2011        Assessment & Plan:  See problem list. Medications and labs reviewed today.  Hyperglycemia - strong FH DM and chronic pred, dose pak taper ongoing - check a1c to monitor same (pt requests labs at later date)

## 2011-11-21 NOTE — Assessment & Plan Note (Signed)
Chronic oxy for control of same exac by sciatica 01/2011 and again 06/2011> MRI with free fragment and encroachment on L4 root -  status post R L4-5 microdiscectomy 07/06/2011 > symptoms improved  Now back to chronic pain mgmt as ongoing - refills provided

## 2011-11-22 ENCOUNTER — Other Ambulatory Visit: Payer: Self-pay | Admitting: *Deleted

## 2011-11-22 NOTE — Telephone Encounter (Signed)
Pt left msg on vm requesting to speak with nurse. Rx's that was given yesterday per pharmacy insurance want cover. Called pt back no answer LMOM RTC... 11/22/11@12 :02pm/LMB

## 2011-11-23 NOTE — Telephone Encounter (Signed)
Called pt again still no answer LMOM RTC... 11/23/11@9 :52am/LMB

## 2011-11-24 ENCOUNTER — Telehealth: Payer: Self-pay | Admitting: Pulmonary Disease

## 2011-11-24 ENCOUNTER — Telehealth: Payer: Self-pay | Admitting: Internal Medicine

## 2011-11-24 MED ORDER — CETIRIZINE HCL 10 MG PO TABS: 10.0000 mg | ORAL_TABLET | Freq: Every day | ORAL | Status: DC

## 2011-11-24 NOTE — Telephone Encounter (Signed)
May have been error to list med as chewable?... Pt takes other tabs/pills, so no need for chew or liquid form - change med list to zyrtec tabs (not chew or liquid) if ok with pt and insurance - thanks

## 2011-11-24 NOTE — Telephone Encounter (Signed)
Called pt no answer LMOM md response. Med sent to [facility]... 11/24/11@3 :08pm/LMB

## 2011-11-24 NOTE — Telephone Encounter (Signed)
The pt called and is hoping to get a refill of zyrtec (in liquid form) sent to the [facility] on [address].   Thanks!

## 2011-11-24 NOTE — Telephone Encounter (Signed)
Pt states insurance will not cover the chewable zyrtec, but will cover the liquid. Requesting md to change to liquid.... 11/24/11@11 :09am/LMB

## 2011-11-24 NOTE — Telephone Encounter (Signed)
Called pt no answer LMOM md response. Sent med zyrtec tab to [facility]... 11/24/11@3 :10pm/LMB

## 2011-11-24 NOTE — Telephone Encounter (Signed)
Pt states she needs a new cushion for her BiPap mask and is having a hard time getting this from [facility].She says she is eligible to get one monthly if needed.  I called and spoke with [place] and she looked in the system and 2 cushions were shipped out to the pt today from the manufacturer. She should received these by Monday, 11/27/11.   Pt aware that cushions were shipped out to her today and she will call [facility] if she does not received them by first of the week and call our office if she has any further problems.

## 2011-11-28 ENCOUNTER — Telehealth: Payer: Self-pay | Admitting: Pulmonary Disease

## 2011-11-28 DIAGNOSIS — G4733 Obstructive sleep apnea (adult) (pediatric): Secondary | ICD-10-CM

## 2011-11-28 DIAGNOSIS — E662 Morbid (severe) obesity with alveolar hypoventilation: Secondary | ICD-10-CM

## 2011-11-28 DIAGNOSIS — J449 Chronic obstructive pulmonary disease, unspecified: Secondary | ICD-10-CM

## 2011-11-28 DIAGNOSIS — D869 Sarcoidosis, unspecified: Secondary | ICD-10-CM

## 2011-11-28 NOTE — Telephone Encounter (Signed)
Called and spoke with pt.  Pt states she is currently getting supplies and such through [facility]. She states she is tired of using [facility] as they continue to give her the run around.  She has been waiting for over 2 weeks to get a mask cushion for her bipap mask.  Pt states it is also very difficult for her to get tanks as they do not deliver to her her home.  Therefore, pt is requesting to switch DME companies.  Ok per [facility] for this.  Order sent to [facility].

## 2011-11-29 ENCOUNTER — Ambulatory Visit: Payer: Medicare Other | Admitting: Internal Medicine

## 2011-12-05 ENCOUNTER — Other Ambulatory Visit: Payer: Self-pay | Admitting: Internal Medicine

## 2011-12-06 MED ORDER — ALPRAZOLAM 0.5 MG PO TABS: 0.5000 mg | ORAL_TABLET | Freq: Two times a day (BID) | ORAL | Status: DC

## 2011-12-06 NOTE — Telephone Encounter (Signed)
Faxed xanax script back to [facility]... 12/06/11@8 :52am/LMB

## 2011-12-06 NOTE — Telephone Encounter (Signed)
Addended by: Deatra James on: 12/06/2011 08:52 AM   Modules accepted: Orders

## 2011-12-07 ENCOUNTER — Telehealth: Payer: Self-pay | Admitting: *Deleted

## 2011-12-07 NOTE — Telephone Encounter (Signed)
Received PA for alprazolam 0.5 mg. Contacted insurance fax over PA form place on md desk for completion... 12/07/11@10 :58am/LMB

## 2011-12-08 NOTE — Telephone Encounter (Signed)
Faxed Pa back to [employer] waiting on approval status... 12/08/11@12 :00pm/LMB

## 2011-12-10 ENCOUNTER — Inpatient Hospital Stay (HOSPITAL_COMMUNITY)
Admission: EM | Admit: 2011-12-10 | Discharge: 2011-12-17 | DRG: 194 | Disposition: A | Discharge status: Home / Self Care | Payer: Medicare Other | Attending: Internal Medicine | Admitting: Internal Medicine

## 2011-12-10 ENCOUNTER — Encounter (HOSPITAL_COMMUNITY): Payer: Self-pay | Admitting: *Deleted

## 2011-12-10 ENCOUNTER — Emergency Department (HOSPITAL_COMMUNITY): Payer: Medicare Other

## 2011-12-10 ENCOUNTER — Other Ambulatory Visit: Payer: Self-pay

## 2011-12-10 DIAGNOSIS — Z9981 Dependence on supplemental oxygen: Secondary | ICD-10-CM

## 2011-12-10 DIAGNOSIS — J449 Chronic obstructive pulmonary disease, unspecified: Secondary | ICD-10-CM | POA: Diagnosis present

## 2011-12-10 DIAGNOSIS — Z6841 Body Mass Index (BMI) 40.0 and over, adult: Secondary | ICD-10-CM

## 2011-12-10 DIAGNOSIS — G4733 Obstructive sleep apnea (adult) (pediatric): Secondary | ICD-10-CM | POA: Diagnosis present

## 2011-12-10 DIAGNOSIS — IMO0001 Reserved for inherently not codable concepts without codable children: Secondary | ICD-10-CM | POA: Diagnosis present

## 2011-12-10 DIAGNOSIS — J4489 Other specified chronic obstructive pulmonary disease: Secondary | ICD-10-CM | POA: Diagnosis present

## 2011-12-10 DIAGNOSIS — R5381 Other malaise: Secondary | ICD-10-CM | POA: Diagnosis present

## 2011-12-10 DIAGNOSIS — E118 Type 2 diabetes mellitus with unspecified complications: Secondary | ICD-10-CM | POA: Diagnosis present

## 2011-12-10 DIAGNOSIS — D869 Sarcoidosis, unspecified: Secondary | ICD-10-CM | POA: Diagnosis present

## 2011-12-10 DIAGNOSIS — J9610 Chronic respiratory failure, unspecified whether with hypoxia or hypercapnia: Secondary | ICD-10-CM | POA: Diagnosis present

## 2011-12-10 DIAGNOSIS — I509 Heart failure, unspecified: Secondary | ICD-10-CM | POA: Diagnosis present

## 2011-12-10 DIAGNOSIS — IMO0002 Reserved for concepts with insufficient information to code with codable children: Secondary | ICD-10-CM | POA: Diagnosis present

## 2011-12-10 DIAGNOSIS — I5032 Chronic diastolic (congestive) heart failure: Secondary | ICD-10-CM | POA: Diagnosis present

## 2011-12-10 DIAGNOSIS — J189 Pneumonia, unspecified organism: Secondary | ICD-10-CM

## 2011-12-10 DIAGNOSIS — R197 Diarrhea, unspecified: Secondary | ICD-10-CM | POA: Diagnosis present

## 2011-12-10 DIAGNOSIS — E1165 Type 2 diabetes mellitus with hyperglycemia: Secondary | ICD-10-CM

## 2011-12-10 DIAGNOSIS — E876 Hypokalemia: Secondary | ICD-10-CM | POA: Diagnosis present

## 2011-12-10 HISTORY — DX: Panic disorder (episodic paroxysmal anxiety): F41.0

## 2011-12-10 HISTORY — DX: Major depressive disorder, single episode, unspecified: F32.9

## 2011-12-10 HISTORY — DX: Depression, unspecified: F32.A

## 2011-12-10 LAB — DIFFERENTIAL
Basophils Absolute: 0 10*3/uL (ref 0.0–0.1)
Basophils Relative: 0 % (ref 0–1)
Eosinophils Relative: 5 % (ref 0–5)
Monocytes Absolute: 0.4 10*3/uL (ref 0.1–1.0)
Monocytes Relative: 6 % (ref 3–12)

## 2011-12-10 LAB — COMPREHENSIVE METABOLIC PANEL
AST: 62 U/L — ABNORMAL HIGH (ref 0–37)
Albumin: 3.6 g/dL (ref 3.5–5.2)
Alkaline Phosphatase: 135 U/L — ABNORMAL HIGH (ref 39–117)
BUN: 7 mg/dL (ref 6–23)
Potassium: 2.8 mEq/L — ABNORMAL LOW (ref 3.5–5.1)
Sodium: 136 mEq/L (ref 135–145)
Total Protein: 7.7 g/dL (ref 6.0–8.3)

## 2011-12-10 LAB — CBC
HCT: 41 % (ref 36.0–46.0)
Hemoglobin: 14.3 g/dL (ref 12.0–15.0)
MCH: 27.7 pg (ref 26.0–34.0)
MCHC: 34.9 g/dL (ref 30.0–36.0)
MCV: 79.3 fL (ref 78.0–100.0)
RDW: 16.7 % — ABNORMAL HIGH (ref 11.5–15.5)

## 2011-12-10 LAB — POCT I-STAT TROPONIN I: Troponin i, poc: 0 ng/mL (ref 0.00–0.08)

## 2011-12-10 LAB — CK TOTAL AND CKMB (NOT AT ARMC)
CK, MB: 1.4 ng/mL (ref 0.3–4.0)
Total CK: 114 U/L (ref 7–177)

## 2011-12-10 NOTE — ED Notes (Signed)
The pt has had sob for one month worse for the past 3 days.  Productive cough thick mucous.  She has 02 at home hx of sarcoidosis..  She has weakness more today

## 2011-12-11 ENCOUNTER — Encounter (HOSPITAL_COMMUNITY): Payer: Self-pay | Admitting: Internal Medicine

## 2011-12-11 DIAGNOSIS — J189 Pneumonia, unspecified organism: Secondary | ICD-10-CM

## 2011-12-11 LAB — PRO B NATRIURETIC PEPTIDE: Pro B Natriuretic peptide (BNP): 20.9 pg/mL (ref 0–125)

## 2011-12-11 LAB — CBC
MCV: 80.3 fL (ref 78.0–100.0)
Platelets: 162 10*3/uL (ref 150–400)
RBC: 4.87 MIL/uL (ref 3.87–5.11)
WBC: 6 10*3/uL (ref 4.0–10.5)

## 2011-12-11 LAB — COMPREHENSIVE METABOLIC PANEL
Albumin: 3.2 g/dL — ABNORMAL LOW (ref 3.5–5.2)
BUN: 10 mg/dL (ref 6–23)
Chloride: 92 mEq/L — ABNORMAL LOW (ref 96–112)
Creatinine, Ser: 0.9 mg/dL (ref 0.50–1.10)
GFR calc Af Amer: 89 mL/min — ABNORMAL LOW (ref >90)
GFR calc non Af Amer: 77 mL/min — ABNORMAL LOW (ref >90)
Glucose, Bld: 170 mg/dL — ABNORMAL HIGH (ref 70–99)
Total Bilirubin: 0.9 mg/dL (ref 0.3–1.2)

## 2011-12-11 LAB — GLUCOSE, CAPILLARY: Glucose-Capillary: 253 mg/dL — ABNORMAL HIGH (ref 70–99)

## 2011-12-11 LAB — CARDIAC PANEL(CRET KIN+CKTOT+MB+TROPI)
CK, MB: 1.4 ng/mL (ref 0.3–4.0)
CK, MB: 1.8 ng/mL (ref 0.3–4.0)
Relative Index: 1.5 (ref 0.0–2.5)
Total CK: 127 U/L (ref 7–177)
Total CK: 122 U/L (ref 7–177)
Troponin I: <0.3 ng/mL (ref <0.30)

## 2011-12-11 LAB — MAGNESIUM: Magnesium: 1.6 mg/dL (ref 1.5–2.5)

## 2011-12-11 LAB — TSH: TSH: 3.755 u[IU]/mL (ref 0.350–4.500)

## 2011-12-11 MED ORDER — OXYCODONE HCL 10 MG PO TB12
10.0000 mg | ORAL_TABLET | Freq: Two times a day (BID) | ORAL | Status: DC
  Administered 2011-12-11 – 2011-12-17 (×13): 10 mg via ORAL
  Filled 2011-12-11 (×13): qty 1

## 2011-12-11 MED ORDER — FLUTICASONE PROPIONATE 50 MCG/ACT NA SUSP
2.0000 | Freq: Every day | NASAL | Status: DC
  Administered 2011-12-11 – 2011-12-17 (×7): 2 via NASAL
  Filled 2011-12-11 (×2): qty 16

## 2011-12-11 MED ORDER — VANCOMYCIN HCL 1000 MG IV SOLR
2500.0000 mg | Freq: Once | INTRAVENOUS | Status: DC
  Filled 2011-12-11 (×2): qty 2500

## 2011-12-11 MED ORDER — PANTOPRAZOLE SODIUM 40 MG PO TBEC
40.0000 mg | DELAYED_RELEASE_TABLET | Freq: Every day | ORAL | Status: DC
  Administered 2011-12-11 – 2011-12-17 (×7): 40 mg via ORAL
  Filled 2011-12-11 (×6): qty 1

## 2011-12-11 MED ORDER — ONDANSETRON HCL 4 MG PO TABS: 4.0000 mg | ORAL_TABLET | Freq: Four times a day (QID) | ORAL | Status: DC | PRN

## 2011-12-11 MED ORDER — ACETAMINOPHEN 325 MG PO TABS
650.0000 mg | ORAL_TABLET | Freq: Four times a day (QID) | ORAL | Status: DC | PRN
  Administered 2011-12-11 – 2011-12-17 (×9): 650 mg via ORAL
  Filled 2011-12-11 (×11): qty 2

## 2011-12-11 MED ORDER — SODIUM CHLORIDE 0.9 % IJ SOLN
3.0000 mL | Freq: Two times a day (BID) | INTRAMUSCULAR | Status: DC
  Administered 2011-12-11 – 2011-12-16 (×9): 3 mL via INTRAVENOUS

## 2011-12-11 MED ORDER — PIPERACILLIN-TAZOBACTAM 3.375 G IVPB
3.3750 g | Freq: Three times a day (TID) | INTRAVENOUS | Status: DC
  Administered 2011-12-11 – 2011-12-13 (×6): 3.375 g via INTRAVENOUS
  Filled 2011-12-11 (×9): qty 50

## 2011-12-11 MED ORDER — ASPIRIN EC 81 MG PO TBEC
81.0000 mg | DELAYED_RELEASE_TABLET | Freq: Every day | ORAL | Status: DC
  Administered 2011-12-11 – 2011-12-17 (×7): 81 mg via ORAL
  Filled 2011-12-11 (×7): qty 1

## 2011-12-11 MED ORDER — PROMETHAZINE HCL 25 MG PO TABS: 25.0000 mg | ORAL_TABLET | Freq: Four times a day (QID) | ORAL | Status: DC | PRN

## 2011-12-11 MED ORDER — SODIUM CHLORIDE 0.9 % IJ SOLN
3.0000 mL | Freq: Two times a day (BID) | INTRAMUSCULAR | Status: DC
  Administered 2011-12-11 – 2011-12-17 (×8): 3 mL via INTRAVENOUS

## 2011-12-11 MED ORDER — POTASSIUM CHLORIDE CRYS ER 20 MEQ PO TBCR
20.0000 meq | EXTENDED_RELEASE_TABLET | Freq: Two times a day (BID) | ORAL | Status: DC
  Administered 2011-12-11: 20 meq via ORAL
  Filled 2011-12-11: qty 1

## 2011-12-11 MED ORDER — SPIRONOLACTONE 25 MG PO TABS
25.0000 mg | ORAL_TABLET | Freq: Every day | ORAL | Status: DC
  Administered 2011-12-11 – 2011-12-17 (×7): 25 mg via ORAL
  Filled 2011-12-11 (×7): qty 1

## 2011-12-11 MED ORDER — ACETAMINOPHEN 650 MG RE SUPP: 650.0000 mg | Freq: Four times a day (QID) | RECTAL | Status: DC | PRN

## 2011-12-11 MED ORDER — VANCOMYCIN HCL 1000 MG IV SOLR
1250.0000 mg | Freq: Two times a day (BID) | INTRAVENOUS | Status: DC
  Administered 2011-12-12 – 2011-12-13 (×3): 1250 mg via INTRAVENOUS
  Filled 2011-12-11 (×5): qty 1250

## 2011-12-11 MED ORDER — INSULIN ASPART 100 UNIT/ML ~~LOC~~ SOLN
0.0000 [IU] | Freq: Three times a day (TID) | SUBCUTANEOUS | Status: DC
  Administered 2011-12-11: 5 [IU] via SUBCUTANEOUS
  Administered 2011-12-11 (×2): 2 [IU] via SUBCUTANEOUS
  Administered 2011-12-12: 5 [IU] via SUBCUTANEOUS
  Administered 2011-12-12: 1 [IU] via SUBCUTANEOUS
  Administered 2011-12-12 – 2011-12-13 (×2): 3 [IU] via SUBCUTANEOUS
  Administered 2011-12-13 (×2): 2 [IU] via SUBCUTANEOUS
  Administered 2011-12-14: 7 [IU] via SUBCUTANEOUS
  Administered 2011-12-14 (×2): 2 [IU] via SUBCUTANEOUS
  Administered 2011-12-15: 5 [IU] via SUBCUTANEOUS
  Administered 2011-12-15 (×2): 2 [IU] via SUBCUTANEOUS

## 2011-12-11 MED ORDER — OXYCODONE-ACETAMINOPHEN 5-325 MG PO TABS
2.0000 | ORAL_TABLET | Freq: Once | ORAL | Status: AC
  Administered 2011-12-11: 2 via ORAL
  Filled 2011-12-11: qty 2

## 2011-12-11 MED ORDER — POTASSIUM CHLORIDE CRYS ER 20 MEQ PO TBCR
40.0000 meq | EXTENDED_RELEASE_TABLET | Freq: Two times a day (BID) | ORAL | Status: DC
  Administered 2011-12-11 – 2011-12-17 (×13): 40 meq via ORAL
  Filled 2011-12-11 (×16): qty 2

## 2011-12-11 MED ORDER — VANCOMYCIN HCL 1000 MG IV SOLR
1250.0000 mg | Freq: Two times a day (BID) | INTRAVENOUS | Status: DC
  Filled 2011-12-11: qty 1250

## 2011-12-11 MED ORDER — SERTRALINE HCL 25 MG PO TABS
25.0000 mg | ORAL_TABLET | Freq: Every day | ORAL | Status: DC
  Administered 2011-12-11 – 2011-12-17 (×7): 25 mg via ORAL
  Filled 2011-12-11 (×7): qty 1

## 2011-12-11 MED ORDER — ALBUTEROL SULFATE (5 MG/ML) 0.5% IN NEBU
2.5000 mg | INHALATION_SOLUTION | Freq: Four times a day (QID) | RESPIRATORY_TRACT | Status: DC
  Administered 2011-12-11 – 2011-12-14 (×12): 2.5 mg via RESPIRATORY_TRACT
  Filled 2011-12-11 (×16): qty 0.5

## 2011-12-11 MED ORDER — ALBUTEROL SULFATE (5 MG/ML) 0.5% IN NEBU: 2.5000 mg | INHALATION_SOLUTION | RESPIRATORY_TRACT | Status: DC | PRN

## 2011-12-11 MED ORDER — LEVOFLOXACIN IN D5W 750 MG/150ML IV SOLN
750.0000 mg | INTRAVENOUS | Status: DC
  Administered 2011-12-12 – 2011-12-15 (×4): 750 mg via INTRAVENOUS
  Filled 2011-12-11 (×4): qty 150

## 2011-12-11 MED ORDER — LORATADINE 10 MG PO TABS
10.0000 mg | ORAL_TABLET | Freq: Every day | ORAL | Status: DC
  Administered 2011-12-11 – 2011-12-17 (×7): 10 mg via ORAL
  Filled 2011-12-11 (×7): qty 1

## 2011-12-11 MED ORDER — POTASSIUM CHLORIDE 20 MEQ/15ML (10%) PO LIQD
40.0000 meq | Freq: Once | ORAL | Status: AC
  Administered 2011-12-11: 40 meq via ORAL
  Filled 2011-12-11: qty 30

## 2011-12-11 MED ORDER — IPRATROPIUM BROMIDE 0.02 % IN SOLN
0.5000 mg | Freq: Four times a day (QID) | RESPIRATORY_TRACT | Status: DC
  Administered 2011-12-11 – 2011-12-14 (×12): 0.5 mg via RESPIRATORY_TRACT
  Filled 2011-12-11 (×16): qty 2.5

## 2011-12-11 MED ORDER — POTASSIUM CHLORIDE 10 MEQ/100ML IV SOLN
10.0000 meq | INTRAVENOUS | Status: AC
  Administered 2011-12-11 (×3): 10 meq via INTRAVENOUS
  Filled 2011-12-11 (×3): qty 100

## 2011-12-11 MED ORDER — TORSEMIDE 100 MG PO TABS
100.0000 mg | ORAL_TABLET | Freq: Two times a day (BID) | ORAL | Status: DC
  Administered 2011-12-11 – 2011-12-13 (×5): 100 mg via ORAL
  Filled 2011-12-11 (×6): qty 1

## 2011-12-11 MED ORDER — ONDANSETRON HCL 4 MG/2ML IJ SOLN
4.0000 mg | Freq: Four times a day (QID) | INTRAMUSCULAR | Status: DC | PRN
  Administered 2011-12-16: 4 mg via INTRAVENOUS
  Filled 2011-12-11: qty 2

## 2011-12-11 MED ORDER — AMLODIPINE BESYLATE 5 MG PO TABS
5.0000 mg | ORAL_TABLET | Freq: Every day | ORAL | Status: DC
  Administered 2011-12-11 – 2011-12-17 (×7): 5 mg via ORAL
  Filled 2011-12-11 (×7): qty 1

## 2011-12-11 MED ORDER — ENOXAPARIN SODIUM 40 MG/0.4ML ~~LOC~~ SOLN
40.0000 mg | SUBCUTANEOUS | Status: DC
  Administered 2011-12-11 – 2011-12-12 (×2): 40 mg via SUBCUTANEOUS
  Filled 2011-12-11 (×2): qty 0.4

## 2011-12-11 MED ORDER — VANCOMYCIN HCL 1000 MG IV SOLR
2500.0000 mg | INTRAVENOUS | Status: AC
  Administered 2011-12-11: 2500 mg via INTRAVENOUS
  Filled 2011-12-11: qty 2500

## 2011-12-11 MED ORDER — PIPERACILLIN-TAZOBACTAM 3.375 G IVPB 30 MIN
3.3750 g | Freq: Once | INTRAVENOUS | Status: AC
  Administered 2011-12-11: 3.375 g via INTRAVENOUS
  Filled 2011-12-11: qty 50

## 2011-12-11 MED ORDER — ALPRAZOLAM 0.5 MG PO TABS
1.0000 mg | ORAL_TABLET | Freq: Two times a day (BID) | ORAL | Status: DC
  Administered 2011-12-11 – 2011-12-17 (×12): 1 mg via ORAL
  Filled 2011-12-11 (×12): qty 2

## 2011-12-11 MED ORDER — BUDESONIDE-FORMOTEROL FUMARATE 160-4.5 MCG/ACT IN AERO
2.0000 | INHALATION_SPRAY | Freq: Two times a day (BID) | RESPIRATORY_TRACT | Status: DC
  Administered 2011-12-11 – 2011-12-17 (×12): 2 via RESPIRATORY_TRACT
  Filled 2011-12-11: qty 6

## 2011-12-11 MED ORDER — LEVOFLOXACIN IN D5W 750 MG/150ML IV SOLN
750.0000 mg | INTRAVENOUS | Status: AC
  Administered 2011-12-11: 750 mg via INTRAVENOUS
  Filled 2011-12-11: qty 150

## 2011-12-11 NOTE — Telephone Encounter (Signed)
Received PA back med has been approved. Notified pharmacy spoke with [place]/pharmacist gave approval status.Marland KitchenMarland Kitchen3/18/13@9 :52am/LMB

## 2011-12-11 NOTE — Progress Notes (Signed)
Physical Therapy Note: order received but per activity orders PT was ordered with activity ad lib and then an hour later limited to bedrest for 12hrs which is not complete until 1645 today. Will attempt after bedrest up if time today otherwise eval will occur tomorrow. Thanks Delaney Meigs, PT [phone]

## 2011-12-11 NOTE — Progress Notes (Signed)
12/11/2011 [facility], Bosie Clos [facility] Case Management Note [phone]    CARE MANAGEMENT NOTE 12/11/2011  Patient:  [name], [name]   Account Number:  [id number]  Date Initiated:  12/11/2011  Documentation initiated by:  Fransico Michael  Subjective/Objective Assessment:   admitted on 12/10/11 with c/o weakness and shortness of breath.     Action/Plan:   prior to admission, patient lived at home with family support. Independent with ADLs   Anticipated DC Date:  12/13/2011   Anticipated DC Plan:  HOME/SELF CARE      DC Planning Services  CM consult      Choice offered to / List presented to:             Status of service:  In process, will continue to follow Medicare Important Message given?   (If response is "NO", the following Medicare IM given date fields will be blank) Date Medicare IM given:   Date Additional Medicare IM given:    Discharge Disposition:    Per UR Regulation:  Reviewed for med. necessity/level of care/duration of stay  If discussed at Long Length of Stay Meetings, dates discussed:    Comments:  AVW:[id number] Leschber  Contact: Orlene Och (Sister)  [phone]

## 2011-12-11 NOTE — H&P (Signed)
[name] is an 46 y.o. female.   PCP - Dr.Leschber. Pulmonary - Dr.Clance. Chief Complaint: Weakness and shortness of breath. HPI: 46 year-old female with history of COPD on home oxygen, OSA on CPAP, hypertension, diastolic CHF with last year measured last year was normal present to the ER because of increasing weakness over the last 3-4 days with cough and productive sputum. Patient states that she's been having off-and-on diarrhea for last one week and has one or 2 episodes everyday. Patient has increasing cough with productive sputum over the last 4 days. And when she coughs she gets some chest discomfort. Patient also being weak which increases on exertion. Denies any nausea vomiting abdominal pain fever chills. Because of the weakness he also gets dizzy though she did not lose consciousness and did not have any focal deficits. In the ER patient had a chest x-ray shows left lower lobe infiltrates concerning for pneumonia and patient also was found to be hypokalemic. Patient will be admitted for further workup.  Past Medical History  Diagnosis Date  . Sleep apnea     noncompliant w/ CPAP  . COPD (chronic obstructive pulmonary disease)     moderate airflow obstruction, suspect d/t emphysema  . Hypertension   . Hyperlipidemia   . Chronic headache   . Fibromyalgia     daily narcotics  . Anxiety     hx chronic BZ use, stopped 07/2010  . Anemia   . Pulmonary sarcoidosis     unimpressive CT chest 2011  . Colonic polyp   . GERD (gastroesophageal reflux disease)   . ALLERGIC RHINITIS   . Asthma   . CHF (congestive heart failure)     Diastolic with fluid overload, May, 2012, LVEF 60%  . Morbid obesity     Past Surgical History  Procedure Date  . Polypectomy 2011  . Lumbar microdiscectomy 07/06/2011    R L4-5, stern    Family History  Problem Relation Age of Onset  . Hypertension Mother   . Emphysema Father   . Hypertension Father   . Stomach cancer Father   . Allergies  Brother   . Hypertension Brother   . Stomach cancer Brother   . Heart disease Mother   . Heart disease Father   . Heart disease Brother    Social History:  reports that she quit smoking about 14 months ago. Her smoking use included Cigarettes. She has a 29 pack-year smoking history. She does not have any smokeless tobacco history on file. She reports that she does not drink alcohol or use illicit drugs.  Allergies:  Allergies  Allergen Reactions  . Azithromycin     REACTION: resistant  . Ciprofloxacin     REACTION: nausea  . Doxycycline     REACTION: restistant  . Latex     REACTION: rash, burning  . Sulfamethoxazole W/Trimethoprim     Bactrim REACTION: Projectile Vomiting    Medications Prior to Admission  Medication Dose Route Frequency Provider Last Rate Last Dose  . acetaminophen (TYLENOL) tablet 650 mg  650 mg Oral Q6H PRN Eduard Clos, MD       Or  . acetaminophen (TYLENOL) suppository 650 mg  650 mg Rectal Q6H PRN Eduard Clos, MD      . albuterol (PROVENTIL) (5 MG/ML) 0.5% nebulizer solution 2.5 mg  2.5 mg Nebulization Q6H Eduard Clos, MD      . albuterol (PROVENTIL) (5 MG/ML) 0.5% nebulizer solution 2.5 mg  2.5 mg Nebulization Q2H  PRN Eduard Clos, MD      . ALPRAZolam Prudy Feeler) tablet 1 mg  1 mg Oral BID Eduard Clos, MD      . amLODipine (NORVASC) tablet 5 mg  5 mg Oral Daily Eduard Clos, MD      . aspirin EC tablet 81 mg  81 mg Oral Daily Eduard Clos, MD      . budesonide-formoterol (SYMBICORT) 160-4.5 MCG/ACT inhaler 2 puff  2 puff Inhalation BID Eduard Clos, MD      . enoxaparin (LOVENOX) injection 40 mg  40 mg Subcutaneous Q24H Eduard Clos, MD      . fluticasone (FLONASE) 50 MCG/ACT nasal spray 2 spray  2 spray Each Nare [id number] Eduard Clos, MD      . insulin aspart (novoLOG) injection 0-9 Units  0-9 Units Subcutaneous TID WC Eduard Clos, MD      . ipratropium (ATROVENT) nebulizer  solution 0.5 mg  0.5 mg Nebulization Q6H Eduard Clos, MD      . Levofloxacin (LEVAQUIN) IVPB 750 mg  750 mg Intravenous To Major Joya Gaskins, MD   750 mg at 12/11/11 0305  . loratadine (CLARITIN) tablet 10 mg  10 mg Oral Daily Eduard Clos, MD      . ondansetron [facility]) tablet 4 mg  4 mg Oral Q6H PRN Eduard Clos, MD       Or  . ondansetron [facility]) injection 4 mg  4 mg Intravenous Q6H PRN Eduard Clos, MD      . oxyCODONE (OXYCONTIN) 12 hr tablet 10 mg  10 mg Oral BID Eduard Clos, MD      . oxyCODONE-acetaminophen (PERCOCET) 5-325 MG per tablet 2 tablet  2 tablet Oral Once Joya Gaskins, MD   2 tablet at 12/11/11 0211  . pantoprazole (PROTONIX) EC tablet 40 mg  40 mg Oral Daily Eduard Clos, MD      . potassium chloride 20 MEQ/15ML (10%) liquid 40 mEq  40 mEq Oral Once Joya Gaskins, MD   40 mEq at 12/11/11 0212  . potassium chloride SA (K-DUR,KLOR-CON) CR tablet 20 mEq  20 mEq Oral BID Eduard Clos, MD      . potassium chloride SA (K-DUR,KLOR-CON) CR tablet 40 mEq  40 mEq Oral BID Eduard Clos, MD      . promethazine (PHENERGAN) tablet 25 mg  25 mg Oral Q6H PRN Eduard Clos, MD      . sertraline (ZOLOFT) tablet 25 mg  25 mg Oral Daily Eduard Clos, MD      . sodium chloride 0.9 % injection 3 mL  3 mL Intravenous Q12H Eduard Clos, MD      . sodium chloride 0.9 % injection 3 mL  3 mL Intravenous Q12H Eduard Clos, MD      . spironolactone (ALDACTONE) tablet 25 mg  25 mg Oral Daily Eduard Clos, MD      . torsemide (DEMADEX) tablet 100 mg  100 mg Oral BID Eduard Clos, MD       Medications Prior to Admission  Medication Sig Dispense Refill  . albuterol (PROVENTIL HFA;VENTOLIN HFA) 108 (90 BASE) MCG/ACT inhaler Inhale 2 puffs into the lungs every 6 (six) hours as needed. For wheeze or shortness of breath      . albuterol (PROVENTIL) (2.5 MG/3ML) 0.083% nebulizer solution Take 2.5 mg  by nebulization every 6 (six) hours as needed. For  wheeze or shortness of breath      . amLODipine (NORVASC) 5 MG tablet TAKE 1 TABLET BY MOUTH EVERY DAY  30 tablet  5  . budesonide-formoterol (SYMBICORT) 160-4.5 MCG/ACT inhaler Inhale 2 puffs into the lungs 2 (two) times daily.        . cetirizine (ZYRTEC) 10 MG tablet Take 1 tablet (10 mg total) by mouth daily.  30 tablet  11  . fluticasone (FLONASE) 50 MCG/ACT nasal spray Place 2 sprays into the nose every morning.  16 g  2  . NEXIUM 40 MG capsule TAKE ONE CAPSULE BY MOUTH DAILY  30 capsule  5  . potassium chloride SA (K-DUR,KLOR-CON) 20 MEQ tablet Take 20 mEq by mouth 2 (two) times daily.       . sertraline (ZOLOFT) 25 MG tablet TAKE 1 TABLET BY MOUTH DAILY  30 tablet  1  . spironolactone (ALDACTONE) 25 MG tablet Take 1 by mouth daily      . tiotropium (SPIRIVA HANDIHALER) 18 MCG inhalation capsule Place 1 capsule (18 mcg total) into inhaler and inhale daily.  30 capsule  5  . torsemide (DEMADEX) 100 MG tablet Take 1 tablet (100 mg total) by mouth 2 (two) times daily.  60 tablet  2    Results for orders placed during the hospital encounter of 12/10/11 (from the past 48 hour(s))  CBC     Status: Abnormal   Collection Time   12/10/11 11:14 PM      Component Value Range Comment   WBC 6.9  4.0 - 10.5 (K/uL)    RBC 5.17 (*) 3.87 - 5.11 (MIL/uL)    Hemoglobin 14.3  12.0 - 15.0 (g/dL)    HCT 04.5  40.9 - 81.1 (%)    MCV 79.3  78.0 - 100.0 (fL)    MCH 27.7  26.0 - 34.0 (pg)    MCHC 34.9  30.0 - 36.0 (g/dL)    RDW 91.4 (*) 78.2 - 15.5 (%)    Platelets 170  150 - 400 (K/uL)   DIFFERENTIAL     Status: Normal   Collection Time   12/10/11 11:14 PM      Component Value Range Comment   Neutrophils Relative 63  43 - 77 (%)    Neutro Abs 4.3  1.7 - 7.7 (K/uL)    Lymphocytes Relative 25  12 - 46 (%)    Lymphs Abs 1.7  0.7 - 4.0 (K/uL)    Monocytes Relative 6  3 - 12 (%)    Monocytes Absolute 0.4  0.1 - 1.0 (K/uL)    Eosinophils Relative 5  0 - 5  (%)    Eosinophils Absolute 0.4  0.0 - 0.7 (K/uL)    Basophils Relative 0  0 - 1 (%)    Basophils Absolute 0.0  0.0 - 0.1 (K/uL)   COMPREHENSIVE METABOLIC PANEL     Status: Abnormal   Collection Time   12/10/11 11:14 PM      Component Value Range Comment   Sodium 136  135 - 145 (mEq/L)    Potassium 2.8 (*) 3.5 - 5.1 (mEq/L)    Chloride 91 (*) 96 - 112 (mEq/L)    CO2 31  19 - 32 (mEq/L)    Glucose, Bld 160 (*) 70 - 99 (mg/dL)    BUN 7  6 - 23 (mg/dL)    Creatinine, Ser 9.56  0.50 - 1.10 (mg/dL)    Calcium 9.1  8.4 - 10.5 (mg/dL)    Total  Protein 7.7  6.0 - 8.3 (g/dL)    Albumin 3.6  3.5 - 5.2 (g/dL)    AST 62 (*) 0 - 37 (U/L)    ALT 72 (*) 0 - 35 (U/L)    Alkaline Phosphatase 135 (*) 39 - 117 (U/L)    Total Bilirubin 0.8  0.3 - 1.2 (mg/dL)    GFR calc non Af Amer 77 (*) >90 (mL/min)    GFR calc Af Amer 89 (*) >90 (mL/min)   CK TOTAL AND CKMB     Status: Normal   Collection Time   12/10/11 11:14 PM      Component Value Range Comment   Total CK 114  7 - 177 (U/L)    CK, MB 1.4  0.3 - 4.0 (ng/mL)    Relative Index 1.2  0.0 - 2.5    POCT I-STAT TROPONIN I     Status: Normal   Collection Time   12/10/11 11:24 PM      Component Value Range Comment   Troponin i, poc 0.00  0.00 - 0.08 (ng/mL)    Comment 3             Dg Chest 2 View  12/11/2011  *RADIOLOGY REPORT*  Clinical Data: Shortness of breath  CHEST - 2 VIEW  Comparison: 11/17/2011  Findings: Heart size is normal.  There is no pleural effusion or edema.  Chronic interstitial coarsening and bibasilar scarring is noted.  Superimposed airspace opacity is identified within the left base which may represent an acute infiltrate.  The visualized osseous structures are unremarkable.  IMPRESSION:  1.  Advanced interstitial coarsening and bibasilar scarring 2.  Left base opacity suspicious for superimposed infiltrate.  Original Report Authenticated By: Rosealee Albee, M.D.    Review of Systems  HENT: Negative.   Eyes: Negative.     Respiratory: Positive for cough and sputum production.   Cardiovascular: Positive for chest pain.  Gastrointestinal: Positive for heartburn.  Genitourinary: Negative.   Musculoskeletal: Negative.   Skin: Negative.   Neurological: Positive for weakness.  Endo/Heme/Allergies: Negative.   Psychiatric/Behavioral: Negative.     Blood pressure 141/101, pulse 98, temperature 96.4 F (35.8 C), temperature source Oral, resp. rate 23, weight 139.9 kg (308 lb 6.8 oz), last menstrual period 01/28/2011, SpO2 95.00%. Physical Exam  Constitutional: She is oriented to person, place, and time. She appears well-developed and well-nourished. No distress.  HENT:  Head: Normocephalic and atraumatic.  Right Ear: External ear normal.  Left Ear: External ear normal.  Eyes: Conjunctivae are normal. Pupils are equal, round, and reactive to light. Right eye exhibits no discharge. Left eye exhibits no discharge. No scleral icterus.  Neck: Normal range of motion. Neck supple.  Cardiovascular: Normal rate and regular rhythm.   Respiratory: Effort normal and breath sounds normal. No respiratory distress. She has no wheezes. She has no rales.  GI: Soft. Bowel sounds are normal. She exhibits no distension. There is no tenderness. There is no rebound.  Musculoskeletal: Normal range of motion. She exhibits no edema and no tenderness.  Neurological: She is alert and oriented to person, place, and time.       Moves all extremities.  Skin: She is not diaphoretic.     Assessment/Plan #1. Shortness of breath possibly from possible pneumonia - patient has seen already started on Levaquin which we will continue for now. Continue her nebulizers and her Demadex. At this time patient does not look fluid overload. Patient has been recently increased on her  Demadex for 4 days and she has gone back to the regular dose for last 10 days. Patient did not notice an increase in her body weight. At this time patient is not wheezing. We  will continue her CPAP for her OSA. Patient also complains of nonspecific chest pain. The EKG does not show anything acute. We'll cycle cardiac markers. #2. Generalized weakness - probably from deconditioning. May also may be from mild dehydration. Patient is complaining of off and on diarrhea for last one week. We will check stool for C. difficile PCR. I also consulted physical therapy. If her weakness persists may have to decrease Demadex dose. #3. Hypokalemia - this must be from the recent increase in Demadex. Also may be from diarrhea. Totally patient has received 80 mEq potassium chloride orally. Recheck metabolic panel and magnesium. #4. History of sarcoidosis. #5. Hyperglycemia - I have placed patient on sensitive sliding-scale with CBG checks.  CODE STATUS - full code.   Alexavier Tsutsui N. 12/11/2011, 3:50 AM

## 2011-12-11 NOTE — Progress Notes (Signed)
46yo female with h/o sarcoidosis on home O2 at all times c/o worsening SOB aggravated by activity and associated with subjective fever and cough.  CXR shows left base opacity suspicious for infilitrate.  To begin IV ABX.  Rec'd Levaquin 750mg  IV in ED.  Will continue Levaquin 750mg  IV Q24H for wt ~140kg and CrCl >135ml/min.  Monitor CBC, Cx, levels prn.  Vernard Gambles, PharmD, BCPS 12/11/2011 3:51 AM

## 2011-12-11 NOTE — Progress Notes (Signed)
ANTIBIOTIC CONSULT NOTE - INITIAL  Pharmacy Consult for Vancocin/Zosyn Indication: rule out pneumonia  Allergies  Allergen Reactions  . Azithromycin     REACTION: resistant  . Ciprofloxacin     REACTION: nausea  . Doxycycline     REACTION: restistant  . Latex     REACTION: rash, burning  . Sulfamethoxazole W/Trimethoprim     Bactrim REACTION: Projectile Vomiting    Patient Measurements: Height: 5\' 9"  (175.3 cm) Weight: 308 lb 6.8 oz (139.9 kg) IBW/kg (Calculated) : 66.2   Vital Signs: Temp: 96.4 F (35.8 C) (03/18 0448) Temp src: Oral (03/18 0448) BP: 141/101 mmHg (03/18 0448) Pulse Rate: 94  (03/18 0448)  Labs:  Basename 12/10/11 2314  WBC 6.9  HGB 14.3  PLT 170  LABCREA --  CREATININE 0.90   Estimated Creatinine Clearance: 120.5 ml/min (by C-G formula based on Cr of 0.9).  Microbiology: Recent Results (from the past 720 hour(s))  MRSA PCR SCREENING     Status: Normal   Collection Time   12/11/11  4:11 AM      Component Value Range Status Comment   MRSA by PCR NEGATIVE  NEGATIVE  Final     Medical History: Past Medical History  Diagnosis Date  . Sleep apnea     noncompliant w/ CPAP  . COPD (chronic obstructive pulmonary disease)     moderate airflow obstruction, suspect d/t emphysema  . Hypertension   . Hyperlipidemia   . Chronic headache   . Fibromyalgia     daily narcotics  . Anxiety     hx chronic BZ use, stopped 07/2010  . Anemia   . Pulmonary sarcoidosis     unimpressive CT chest 2011  . Colonic polyp   . GERD (gastroesophageal reflux disease)   . ALLERGIC RHINITIS   . Asthma   . CHF (congestive heart failure)     Diastolic with fluid overload, May, 2012, LVEF 60%  . Morbid obesity     Medications:  Prescriptions prior to admission  Medication Sig Dispense Refill  . albuterol (PROVENTIL HFA;VENTOLIN HFA) 108 (90 BASE) MCG/ACT inhaler Inhale 2 puffs into the lungs every 6 (six) hours as needed. For wheeze or shortness of breath        . albuterol (PROVENTIL) (2.5 MG/3ML) 0.083% nebulizer solution Take 2.5 mg by nebulization every 6 (six) hours as needed. For wheeze or shortness of breath      . ALPRAZolam (XANAX) 1 MG tablet Take 1 mg by mouth 2 (two) times daily.      Marland Kitchen amLODipine (NORVASC) 5 MG tablet TAKE 1 TABLET BY MOUTH EVERY DAY  30 tablet  5  . budesonide-formoterol (SYMBICORT) 160-4.5 MCG/ACT inhaler Inhale 2 puffs into the lungs 2 (two) times daily.        . cetirizine (ZYRTEC) 10 MG tablet Take 1 tablet (10 mg total) by mouth daily.  30 tablet  11  . cetirizine (ZYRTEC) 10 MG tablet Take 10 mg by mouth at bedtime.      . fluticasone (FLONASE) 50 MCG/ACT nasal spray Place 2 sprays into the nose every morning.  16 g  2  . NEXIUM 40 MG capsule TAKE ONE CAPSULE BY MOUTH DAILY  30 capsule  5  . oxyCODONE (OXYCONTIN) 10 MG 12 hr tablet Take 5 mg by mouth 2 (two) times daily.      . potassium chloride SA (K-DUR,KLOR-CON) 20 MEQ tablet Take 20 mEq by mouth 2 (two) times daily.       Marland Kitchen  sertraline (ZOLOFT) 25 MG tablet TAKE 1 TABLET BY MOUTH DAILY  30 tablet  1  . spironolactone (ALDACTONE) 25 MG tablet Take 1 by mouth daily      . tiotropium (SPIRIVA HANDIHALER) 18 MCG inhalation capsule Place 1 capsule (18 mcg total) into inhaler and inhale daily.  30 capsule  5  . torsemide (DEMADEX) 100 MG tablet Take 1 tablet (100 mg total) by mouth 2 (two) times daily.  60 tablet  2  . promethazine (PHENERGAN) 25 MG tablet Take 1 tablet (25 mg total) by mouth every 6 (six) hours as needed for nausea.  30 tablet  0   Scheduled:    . albuterol  2.5 mg Nebulization Q6H  . ALPRAZolam  1 mg Oral BID  . amLODipine  5 mg Oral Daily  . aspirin EC  81 mg Oral Daily  . budesonide-formoterol  2 puff Inhalation BID  . enoxaparin  40 mg Subcutaneous Q24H  . fluticasone  2 spray Each Nare Daily  . insulin aspart  0-9 Units Subcutaneous TID WC  . ipratropium  0.5 mg Nebulization Q6H  . levofloxacin (LEVAQUIN) IV  750 mg Intravenous To Major   . levofloxacin (LEVAQUIN) IV  750 mg Intravenous Q24H  . loratadine  10 mg Oral Daily  . oxyCODONE  10 mg Oral BID  . oxyCODONE-acetaminophen  2 tablet Oral Once  . pantoprazole  40 mg Oral Daily  . piperacillin-tazobactam  3.375 g Intravenous Once  . piperacillin-tazobactam (ZOSYN)  IV  3.375 g Intravenous Q8H  . potassium chloride  40 mEq Oral Once  . potassium chloride SA  20 mEq Oral BID  . potassium chloride  40 mEq Oral BID  . sertraline  25 mg Oral Daily  . sodium chloride  3 mL Intravenous Q12H  . sodium chloride  3 mL Intravenous Q12H  . spironolactone  25 mg Oral Daily  . torsemide  100 mg Oral BID  . vancomycin  1,250 mg Intravenous Q12H  . vancomycin  2,500 mg Intravenous Once   Assessment: 46yo female with CXR suspicious for PNA, had been on two courses of ABX over past two months, now to begin vanc and Zosyn.  Goal of Therapy:  Vancomycin trough level 15-20 mcg/ml  Plan:  Will give a vancomycin load of 2500mg  x1 followed by 1250mg  IV Q12H as well as Zosyn 3.375g IV Q8H.  Monitor CBC, Cx, levels prn.  Colleen Can PharmD BCPS 12/11/2011,7:59 AM

## 2011-12-11 NOTE — ED Provider Notes (Signed)
History     CSN: [id number]  Arrival date & time 12/10/11  2247   First MD Initiated Contact with Patient 12/11/11 0055      Chief Complaint  Patient presents with  . Shortness of Breath     Patient is a 46 y.o. female presenting with shortness of breath. The history is provided by the patient.  Shortness of Breath  The current episode started more than 2 weeks ago. The problem occurs frequently. The problem has been gradually worsening. The problem is severe. The symptoms are relieved by nothing. The symptoms are aggravated by activity. Associated symptoms include a fever, cough and shortness of breath. Pertinent negatives include no chest pain.  pt presents with cough, SOB, and generalized weakness/fatigue She is on home oxygen (2L) at all times Reports feeling worse in past several days No syncope No active CP at this time She is not on antibiotics at this time  Past Medical History  Diagnosis Date  . Sleep apnea     noncompliant w/ CPAP  . COPD (chronic obstructive pulmonary disease)     moderate airflow obstruction, suspect d/t emphysema  . Hypertension   . Hyperlipidemia   . Chronic headache   . Fibromyalgia     daily narcotics  . Anxiety     hx chronic BZ use, stopped 07/2010  . Anemia   . Pulmonary sarcoidosis     unimpressive CT chest 2011  . Colonic polyp   . GERD (gastroesophageal reflux disease)   . ALLERGIC RHINITIS   . Asthma   . CHF (congestive heart failure)     Diastolic with fluid overload, May, 2012, LVEF 60%  . Morbid obesity     Past Surgical History  Procedure Date  . Polypectomy 2011  . Lumbar microdiscectomy 07/06/2011    R L4-5, stern    Family History  Problem Relation Age of Onset  . Hypertension Mother   . Emphysema Father   . Hypertension Father   . Stomach cancer Father   . Allergies Brother   . Hypertension Brother   . Stomach cancer Brother   . Heart disease Mother   . Heart disease Father   . Heart disease Brother      History  Substance Use Topics  . Smoking status: Former Smoker -- 1.0 packs/day for 29 years    Types: Cigarettes    Quit date: 09/14/2010  . Smokeless tobacco: Not on file  . Alcohol Use: No    OB History    Grav Para Term Preterm Abortions TAB SAB Ect Mult Living                  Review of Systems  Constitutional: Positive for fever.  Respiratory: Positive for cough and shortness of breath.   Cardiovascular: Negative for chest pain.  All other systems reviewed and are negative.    Allergies  Azithromycin; Ciprofloxacin; Doxycycline; Latex; and Sulfamethoxazole w/trimethoprim  Home Medications   Current Outpatient Rx  Name Route Sig Dispense Refill  . ALBUTEROL SULFATE HFA 108 (90 BASE) MCG/ACT IN AERS Inhalation Inhale 2 puffs into the lungs every 6 (six) hours as needed. For wheeze or shortness of breath    . ALBUTEROL SULFATE (2.5 MG/3ML) 0.083% IN NEBU Nebulization Take 2.5 mg by nebulization every 6 (six) hours as needed. For wheeze or shortness of breath    . ALPRAZOLAM 1 MG PO TABS Oral Take 1 mg by mouth 2 (two) times daily.    Marland Kitchen AMLODIPINE  BESYLATE 5 MG PO TABS  TAKE 1 TABLET BY MOUTH EVERY DAY 30 tablet 5  . BUDESONIDE-FORMOTEROL FUMARATE 160-4.5 MCG/ACT IN AERO Inhalation Inhale 2 puffs into the lungs 2 (two) times daily.      Marland Kitchen CETIRIZINE HCL 10 MG PO TABS Oral Take 1 tablet (10 mg total) by mouth daily. 30 tablet 11  . CETIRIZINE HCL 10 MG PO TABS Oral Take 10 mg by mouth at bedtime.    Marland Kitchen FLUTICASONE PROPIONATE 50 MCG/ACT NA SUSP Nasal Place 2 sprays into the nose every morning. 16 g 2  . NEXIUM 40 MG PO CPDR  TAKE ONE CAPSULE BY MOUTH DAILY 30 capsule 5  . OXYCODONE HCL ER 10 MG PO TB12 Oral Take 5 mg by mouth 2 (two) times daily.    Marland Kitchen POTASSIUM CHLORIDE CRYS ER 20 MEQ PO TBCR Oral Take 20 mEq by mouth 2 (two) times daily.     . SERTRALINE HCL 25 MG PO TABS  TAKE 1 TABLET BY MOUTH DAILY 30 tablet 1  . SPIRONOLACTONE 25 MG PO TABS  Take 1 by mouth daily     . TIOTROPIUM BROMIDE MONOHYDRATE 18 MCG IN CAPS Inhalation Place 1 capsule (18 mcg total) into inhaler and inhale daily. 30 capsule 5  . TORSEMIDE 100 MG PO TABS Oral Take 1 tablet (100 mg total) by mouth 2 (two) times daily. 60 tablet 2  . PROMETHAZINE HCL 25 MG PO TABS Oral Take 1 tablet (25 mg total) by mouth every 6 (six) hours as needed for nausea. 30 tablet 0    BP 131/87  Pulse 93  Temp(Src) 98.4 F (36.9 C) (Oral)  Resp 18  SpO2 99%  LMP 01/28/2011  Physical Exam CONSTITUTIONAL: Well developed/well nourished HEAD AND FACE: Normocephalic/atraumatic EYES: EOMI/PERRL ENMT: Mucous membranes moist NECK: supple no meningeal signs SPINE:entire spine nontender CV: S1/S2 noted, no murmurs/rubs/gallops noted LUNGS:tachypnea, scattered wheeze noted, able to speak to me clearly ABDOMEN: soft, nontender, no rebound or guarding, obese GU:no cva tenderness NEURO: Pt is awake/alert, moves all extremitiesx4 EXTREMITIES: pulses normal, full ROM SKIN: warm, color normal PSYCH: no abnormalities of mood noted  ED Course  Procedures   Labs Reviewed  CBC - Abnormal; Notable for the following:    RBC 5.17 (*)    RDW 16.7 (*)    All other components within normal limits  COMPREHENSIVE METABOLIC PANEL - Abnormal; Notable for the following:    Potassium 2.8 (*)    Chloride 91 (*)    Glucose, Bld 160 (*)    AST 62 (*)    ALT 72 (*)    Alkaline Phosphatase 135 (*)    GFR calc non Af Amer 77 (*)    GFR calc Af Amer 89 (*)    All other components within normal limits  DIFFERENTIAL  CK TOTAL AND CKMB  POCT I-STAT TROPONIN I   Dg Chest 2 View  12/11/2011  *RADIOLOGY REPORT*  Clinical Data: Shortness of breath  CHEST - 2 VIEW  Comparison: 11/17/2011  Findings: Heart size is normal.  There is no pleural effusion or edema.  Chronic interstitial coarsening and bibasilar scarring is noted.  Superimposed airspace opacity is identified within the left base which may represent an acute  infiltrate.  The visualized osseous structures are unremarkable.  IMPRESSION:  1.  Advanced interstitial coarsening and bibasilar scarring 2.  Left base opacity suspicious for superimposed infiltrate.  Original Report Authenticated By: Rosealee Albee, M.D.    1:36 AM Pt with  h/o sarcoid on home O2 here with worsening cough/SOB Pneumonia noted Will admit/treat Also noted to have hypokalemia  D/w dr Toniann Fail, will admit to tele/inpatient Feel EKG changes could be due to hypoK, doubt ACS/PE at this time   MDM  Nursing notes reviewed and considered in documentation All labs/vitals reviewed and considered xrays reviewed and considered      Date: 12/11/2011  Rate: 99  Rhythm: normal sinus rhythm  QRS Axis: normal  Intervals: normal  ST/T Wave abnormalities: inverted t waves  Conduction Disutrbances:none  Narrative Interpretation:   Old EKG Reviewed: changes noted    Joya Gaskins, MD 12/11/11 [phone]

## 2011-12-11 NOTE — Progress Notes (Signed)
Patient seen and examined, admitted by Dr. Toniann Fail this morning. Briefly 46 year old female with history of COPD and sarcoidosis, follows Dr. Marcelyn Bruins [facility] pulmonology) admitted with pneumonia and increasing weakness over the last 3-4 days. Patient states that she was on antibiotics twice in the last 3 months for recurrent pneumonia. - Agree with current management and plan for Dr. Toniann Fail - Placed on broad-spectrum antibiotics for next 24-48 hours pending cultures and clinical stability - Followup on stool cultures, PT eval   [name] M.D. [facility] 12/11/2011, 10:10 AM  Pager: [phone]

## 2011-12-12 LAB — CBC
Hemoglobin: 12.8 g/dL (ref 12.0–15.0)
Platelets: 146 10*3/uL — ABNORMAL LOW (ref 150–400)
RBC: 4.78 MIL/uL (ref 3.87–5.11)

## 2011-12-12 LAB — BASIC METABOLIC PANEL
CO2: 29 mEq/L (ref 19–32)
GFR calc non Af Amer: 88 mL/min — ABNORMAL LOW (ref >90)
Glucose, Bld: 158 mg/dL — ABNORMAL HIGH (ref 70–99)
Potassium: 3.1 mEq/L — ABNORMAL LOW (ref 3.5–5.1)
Sodium: 138 mEq/L (ref 135–145)

## 2011-12-12 LAB — GLUCOSE, CAPILLARY

## 2011-12-12 LAB — POTASSIUM: Potassium: 3.8 mEq/L (ref 3.5–5.1)

## 2011-12-12 MED ORDER — ENOXAPARIN SODIUM 80 MG/0.8ML ~~LOC~~ SOLN
70.0000 mg | SUBCUTANEOUS | Status: DC
  Administered 2011-12-13 – 2011-12-17 (×5): 70 mg via SUBCUTANEOUS
  Filled 2011-12-12 (×5): qty 0.8

## 2011-12-12 MED ORDER — POTASSIUM CHLORIDE CRYS ER 20 MEQ PO TBCR
40.0000 meq | EXTENDED_RELEASE_TABLET | Freq: Once | ORAL | Status: AC
  Administered 2011-12-12: 40 meq via ORAL

## 2011-12-12 MED ORDER — ENOXAPARIN SODIUM 30 MG/0.3ML ~~LOC~~ SOLN
30.0000 mg | SUBCUTANEOUS | Status: AC
  Administered 2011-12-12: 30 mg via SUBCUTANEOUS
  Filled 2011-12-12: qty 0.3

## 2011-12-12 NOTE — Progress Notes (Signed)
Utilization review completed.  

## 2011-12-12 NOTE — Progress Notes (Addendum)
[name]  female  EXB:[id number]    DOB: [birth year]    DOA: 12/10/2011  PCP: Rene Paci, MD, MD  Subjective: Still coughing, however improving from the time of admission  Objective: Weight change: 1 kg (2 lb 3.3 oz)  Intake/Output Summary (Last 24 hours) at 12/12/11 1402 Last data filed at 12/12/11 1200  Gross per 24 hour  Intake    723 ml  Output   2250 ml  Net  -1527 ml   Blood pressure 121/79, pulse 94, temperature 98.3 F (36.8 C), temperature source Oral, resp. rate 20, height 5\' 9"  (1.753 m), weight 140.9 kg (310 lb 10.1 oz), last menstrual period 01/28/2011, SpO2 93.00%.  Physical Exam: General: Alert and awake, oriented x3, not in any acute distress. HEENT: anicteric sclera, pupils reactive to light and accommodation, EOMI CVS: S1-S2 clear, no murmur rubs or gallops Chest: Decreased breath sounds at the bases  Abdomen: Obese, soft nontender, nondistended, normal bowel sounds, no organomegaly Extremities: no cyanosis, clubbing or edema noted bilaterally Neuro: Cranial nerves II-XII intact, no focal neurological deficits  Lab Results: Basic Metabolic Panel:  Lab 12/12/11 1027 12/12/11 0049 12/11/11 1000  NA 138 -- 138  K 3.1* 3.8 --  CL 96 -- 92*  CO2 29 -- 32  GLUCOSE 158* -- 170*  BUN 9 -- 10  CREATININE 0.80 -- 0.90  CALCIUM 8.3* -- 9.2  MG -- -- 1.6  PHOS -- -- --   Liver Function Tests:  Lab 12/11/11 1000 12/10/11 2314  AST 42* 62*  ALT 60* 72*  ALKPHOS 129* 135*  BILITOT 0.9 0.8  PROT 7.1 7.7  ALBUMIN 3.2* 3.6   CBC:  Lab 12/12/11 0500 12/11/11 1000 12/10/11 2314  WBC 5.1 6.0 --  NEUTROABS -- -- 4.3  HGB 12.8 13.0 --  HCT 38.3 39.1 --  MCV 80.1 80.3 --  PLT 146* 162 --   Cardiac Enzymes:  Lab 12/11/11 1939 12/11/11 1135 12/11/11 0405  CKTOTAL 122 145 127  CKMB 1.8 1.7 1.4  CKMBINDEX -- -- --  TROPONINI <0.30 <0.30 <0.30   BNP: No components found with this basename: POCBNP:2 CBG:  Lab 12/12/11 1148 12/12/11  0704 12/11/11 1640 12/11/11 1152 12/11/11 0638  GLUCAP 253* 144* 253* 172* 196*     Micro Results: Recent Results (from the past 240 hour(s))  MRSA PCR SCREENING     Status: Normal   Collection Time   12/11/11  4:11 AM      Component Value Range Status Comment   MRSA by PCR NEGATIVE  NEGATIVE  Final     Studies/Results: Dg Chest 2 View  12/11/2011  *RADIOLOGY REPORT*  Clinical Data: Shortness of breath  CHEST - 2 VIEW  Comparison: 11/17/2011  Findings: Heart size is normal.  There is no pleural effusion or edema.  Chronic interstitial coarsening and bibasilar scarring is noted.  Superimposed airspace opacity is identified within the left base which may represent an acute infiltrate.  The visualized osseous structures are unremarkable.  IMPRESSION:  1.  Advanced interstitial coarsening and bibasilar scarring 2.  Left base opacity suspicious for superimposed infiltrate.  Original Report Authenticated By: Rosealee Albee, M.D.   Dg Chest 2 View  11/17/2011  *RADIOLOGY REPORT*  Clinical Data: Cough and shortness of breath.  CHEST - 2 VIEW  Comparison: 10/25/2011.  Findings: The cardiac silhouette remains borderline enlarged.  No significant change in prominence of the interstitial markings with linear densities in both lower lung zones.  Stable  mild diffuse peribronchial thickening.  Unremarkable bones.  IMPRESSION: Stable borderline cardiomegaly and chronic interstitial lung disease with bibasilar scarring.  Original Report Authenticated By: Darrol Angel, M.D.    Medications: Scheduled Meds:   . albuterol  2.5 mg Nebulization Q6H  . ALPRAZolam  1 mg Oral BID  . amLODipine  5 mg Oral Daily  . aspirin EC  81 mg Oral Daily  . budesonide-formoterol  2 puff Inhalation BID  . enoxaparin (LOVENOX) injection  30 mg Subcutaneous NOW  . enoxaparin (LOVENOX) injection  70 mg Subcutaneous Q24H  . fluticasone  2 spray Each Nare Daily  . insulin aspart  0-9 Units Subcutaneous TID WC  . ipratropium   0.5 mg Nebulization Q6H  . levofloxacin (LEVAQUIN) IV  750 mg Intravenous Q24H  . loratadine  10 mg Oral Daily  . oxyCODONE  10 mg Oral BID  . pantoprazole  40 mg Oral Daily  . piperacillin-tazobactam (ZOSYN)  IV  3.375 g Intravenous Q8H  . potassium chloride  10 mEq Intravenous Q1 Hr x 3  . potassium chloride  40 mEq Oral BID  . sertraline  25 mg Oral Daily  . sodium chloride  3 mL Intravenous Q12H  . sodium chloride  3 mL Intravenous Q12H  . spironolactone  25 mg Oral Daily  . torsemide  100 mg Oral BID  . vancomycin  1,250 mg Intravenous Q12H  . vancomycin  2,500 mg Intravenous NOW  . DISCONTD: enoxaparin  40 mg Subcutaneous Q24H  . DISCONTD: vancomycin  1,250 mg Intravenous Q12H  . DISCONTD: vancomycin  2,500 mg Intravenous Once   Continuous Infusions:    Assessment/Plan: Principal Problem:  *HCAP : Symptoms improving - Continue broad-spectrum antibiotics for next 24 hours, follow cultures and clinical stability and narrow antibiotics  Active Problems:  COPD with chronic respiratory failure: Oxygen dependent, Improving, continue nebulizers, Symbicort, antibiotics - Home O2 evaluation prior to the discharge   OBSTRUCTIVE SLEEP APNEA: Patient placed on CPAP on her home settings   CHF (congestive heart failure): Currently compensated, continue Demadex   Hypokalemia: Replaced   Diarrhea: Per patient, resolved  DVT Prophylaxis: Lovenox  Code Status: Full code  Disposition: DC in 1-2 days if stable   LOS: 2 days   RAI,RIPUDEEP M.D. [facility] 12/12/2011, 2:02 PM Pager: [phone]

## 2011-12-12 NOTE — Consult Note (Signed)
ANTICOAGULATION CONSULT NOTE - Initial Consult  Pharmacy Consult for:  Lovenox dosing adjustment for weight and renal function Indication: VTE prophylaxis  Allergies  Allergen Reactions  . Ciprofloxacin     REACTION: nausea  . Latex     REACTION: rash, burning  . Sulfamethoxazole W/Trimethoprim     Bactrim REACTION: Projectile Vomiting  . Azithromycin     REACTION: resistant  . Doxycycline     REACTION: restistant    Patient Measurements: Height: 5\' 9"  (175.3 cm) Weight: 310 lb 10.1 oz (140.9 kg) IBW/kg (Calculated) : 66.2  BMI: 46  Vital Signs: Temp: 97.5 F (36.4 C) (03/19 0423) Temp src: Oral (03/19 0423) BP: 124/80 mmHg (03/19 1045) Pulse Rate: 114  (03/19 0925)  Labs:  Basename 12/12/11 0500 12/11/11 1939 12/11/11 1135 12/11/11 1000 12/11/11 0405 12/10/11 2314  HGB 12.8 -- -- 13.0 -- --  HCT 38.3 -- -- 39.1 -- 41.0  PLT 146* -- -- 162 -- 170  APTT -- -- -- -- -- --  LABPROT -- -- -- -- -- --  INR -- -- -- -- -- --  HEPARINUNFRC -- -- -- -- -- --  CREATININE 0.80 -- -- 0.90 -- 0.90  CKTOTAL -- 122 145 -- 127 --  CKMB -- 1.8 1.7 -- 1.4 --  TROPONINI -- <0.30 <0.30 -- <0.30 --   Estimated Creatinine Clearance: 136.1 ml/min (by C-G formula based on Cr of 0.8).  Medical / Surgical History: Past Medical History  Diagnosis Date  . Sleep apnea     noncompliant w/ CPAP  . COPD (chronic obstructive pulmonary disease)     moderate airflow obstruction, suspect d/t emphysema  . Hypertension   . Hyperlipidemia   . Chronic headache   . Fibromyalgia     daily narcotics  . Anxiety     hx chronic BZ use, stopped 07/2010  . Anemia   . Pulmonary sarcoidosis     unimpressive CT chest 2011  . Colonic polyp   . GERD (gastroesophageal reflux disease)   . ALLERGIC RHINITIS   . Asthma   . CHF (congestive heart failure)     Diastolic with fluid overload, May, 2012, LVEF 60%  . Morbid obesity   . Pneumonia 12/11/11    "first time since 01/2010; I get it often"  .  Heart murmur   . Angina   . Sarcoidosis of skin   . Emphysema   . Shortness of breath on exertion   . Chronic back pain greater than 3 months duration   . Depression   . PTSD (post-traumatic stress disorder)   . Panic attacks    Past Surgical History  Procedure Date  . Polypectomy 2011  . Lumbar microdiscectomy 07/06/2011    R L4-5, stern  . Back surgery     Medications:   Scheduled:    . albuterol  2.5 mg Nebulization Q6H  . ALPRAZolam  1 mg Oral BID  . amLODipine  5 mg Oral Daily  . aspirin EC  81 mg Oral Daily  . budesonide-formoterol  2 puff Inhalation BID  . enoxaparin  40 mg Subcutaneous Q24H  . fluticasone  2 spray Each Nare Daily  . insulin aspart  0-9 Units Subcutaneous TID WC  . ipratropium  0.5 mg Nebulization Q6H  . levofloxacin (LEVAQUIN) IV  750 mg Intravenous Q24H  . loratadine  10 mg Oral Daily  . oxyCODONE  10 mg Oral BID  . pantoprazole  40 mg Oral Daily  . piperacillin-tazobactam (ZOSYN)  IV  3.375 g Intravenous Q8H  . potassium chloride  10 mEq Intravenous Q1 Hr x 3  . potassium chloride  40 mEq Oral BID  . sertraline  25 mg Oral Daily  . sodium chloride  3 mL Intravenous Q12H  . sodium chloride  3 mL Intravenous Q12H  . spironolactone  25 mg Oral Daily  . torsemide  100 mg Oral BID  . vancomycin  1,250 mg Intravenous Q12H  . vancomycin  2,500 mg Intravenous NOW  . DISCONTD: potassium chloride SA  20 mEq Oral BID  . DISCONTD: vancomycin  1,250 mg Intravenous Q12H  . DISCONTD: vancomycin  2,500 mg Intravenous Once   Assessment:  Patient is a morbidly obese female (BMI 46) currently being treated for recurrent PNA who is receiving Lovenox 40 mg sq q 24 hours for VTE prophylaxis.  With multiple risk factors, Lovenox should be adjusted to 0.5 mg/kg/daily for VTE prophylaxis.  Goal of Therapy:   Lovenox 0.5 mg/kg/day   Plan:   Change Lovenox to 70 mg sq daily.  Patient has received 40 mg sq this AM, will supplement with an additional 30 mg  now for total today of 70 mg Lovenox.  Follow CBC's q 3 days.  Geoge Lawrance, Elisha Headland, Pharm.D. 12/12/2011 11:17 AM

## 2011-12-12 NOTE — Progress Notes (Signed)
Inpatient Diabetes Program Recommendations  AACE/ADA: New Consensus Statement on Inpatient Glycemic Control (2009)  Target Ranges:  Prepandial:   less than 140 mg/dL      Peak postprandial:   less than 180 mg/dL (1-2 hours)      Critically ill patients:  140 - 180 mg/dL   Reason for Visit: No history of diabetes  Noted, however CBG's elevated. Please order HgbA1C to determine pre-hospitalization glycemic control.     Note: Will follow.

## 2011-12-12 NOTE — Progress Notes (Signed)
INITIAL ADULT NUTRITION ASSESSMENT Date: 12/12/2011   Time: 9:55 AM Reason for Assessment: Screened for nutrition risk, Dysphagia  ASSESSMENT: Female 46 y.o.  Dx: CAP (community acquired pneumonia)  Hx:  Past Medical History  Diagnosis Date  . Sleep apnea     noncompliant w/ CPAP  . COPD (chronic obstructive pulmonary disease)     moderate airflow obstruction, suspect d/t emphysema  . Hypertension   . Hyperlipidemia   . Chronic headache   . Fibromyalgia     daily narcotics  . Anxiety     hx chronic BZ use, stopped 07/2010  . Anemia   . Pulmonary sarcoidosis     unimpressive CT chest 2011  . Colonic polyp   . GERD (gastroesophageal reflux disease)   . ALLERGIC RHINITIS   . Asthma   . CHF (congestive heart failure)     Diastolic with fluid overload, May, 2012, LVEF 60%  . Morbid obesity   . Pneumonia 12/11/11    "first time since 01/2010; I get it often"  . Heart murmur   . Angina   . Sarcoidosis of skin   . Emphysema   . Shortness of breath on exertion   . Chronic back pain greater than 3 months duration   . Depression   . PTSD (post-traumatic stress disorder)   . Panic attacks    Related Meds:  Scheduled Meds:   . albuterol  2.5 mg Nebulization Q6H  . ALPRAZolam  1 mg Oral BID  . amLODipine  5 mg Oral Daily  . aspirin EC  81 mg Oral Daily  . budesonide-formoterol  2 puff Inhalation BID  . enoxaparin  40 mg Subcutaneous Q24H  . fluticasone  2 spray Each Nare Daily  . insulin aspart  0-9 Units Subcutaneous TID WC  . ipratropium  0.5 mg Nebulization Q6H  . levofloxacin (LEVAQUIN) IV  750 mg Intravenous Q24H  . loratadine  10 mg Oral Daily  . oxyCODONE  10 mg Oral BID  . pantoprazole  40 mg Oral Daily  . piperacillin-tazobactam  3.375 g Intravenous Once  . piperacillin-tazobactam (ZOSYN)  IV  3.375 g Intravenous Q8H  . potassium chloride  10 mEq Intravenous Q1 Hr x 3  . potassium chloride  40 mEq Oral BID  . sertraline  25 mg Oral Daily  . sodium chloride   3 mL Intravenous Q12H  . sodium chloride  3 mL Intravenous Q12H  . spironolactone  25 mg Oral Daily  . torsemide  100 mg Oral BID  . vancomycin  1,250 mg Intravenous Q12H  . vancomycin  2,500 mg Intravenous NOW  . DISCONTD: potassium chloride SA  20 mEq Oral BID  . DISCONTD: vancomycin  1,250 mg Intravenous Q12H  . DISCONTD: vancomycin  2,500 mg Intravenous Once   Continuous Infusions:  PRN Meds:.acetaminophen, acetaminophen, albuterol, ondansetron (ZOFRAN) IV, ondansetron, promethazine  Ht: 5\' 9"  (175.3 cm)  Wt: 310 lb 10.1 oz (140.9 kg)  Ideal Wt: 65.9kg % Ideal Wt: 213.7%  Usual Wt: 220 pounds % Usual Wt: 140.9%  Body mass index is 45.87 kg/(m^2). Extreme Obesity Class III  Food/Nutrition Related Hx: Patient reported good appetite. Stated problems chewing and swallowing due to trouble eating with dentures. She stated she tries to follow a low sodium diet at home. Patient denies need to receive softer foods for ease of chewing and swallowing, she stated she would not eat it. PO intake documented 100% at meals. Patient requests a swallowing study.   Labs:  CMP  Component Value Date/Time   NA 138 12/12/2011 0500   K 3.1* 12/12/2011 0500   CL 96 12/12/2011 0500   CO2 29 12/12/2011 0500   GLUCOSE 158* 12/12/2011 0500   BUN 9 12/12/2011 0500   CREATININE 0.80 12/12/2011 0500   CALCIUM 8.3* 12/12/2011 0500   PROT 7.1 12/11/2011 1000   ALBUMIN 3.2* 12/11/2011 1000   AST 42* 12/11/2011 1000   ALT 60* 12/11/2011 1000   ALKPHOS 129* 12/11/2011 1000   BILITOT 0.9 12/11/2011 1000   GFRNONAA 88* 12/12/2011 0500   GFRAA >90 12/12/2011 0500    Intake/Output Summary (Last 24 hours) at 12/12/11 0957 Last data filed at 12/12/11 0300  Gross per 24 hour  Intake    480 ml  Output   2250 ml  Net  -1770 ml    Diet Order: Cardiac  Supplements/Tube Feeding: none at this time  IVF:    Estimated Nutritional Needs:   Kcal: 7829-5621 Protein: 155-183 grams Fluid: 1 ml per kcal  NUTRITION  DIAGNOSIS: -Biting/Chewing (NI-1.2).  Status: Ongoing -Swallowing difficulty  RELATED TO: dentures  AS EVIDENCE BY: patient reports suppose to eat with dentures but is unable which causes trouble chewing and swallowing.   MONITORING/EVALUATION(Goals): PO intake, weight trends, labs, swallowing ability 1. Meet > 90% of estimated nutrition needs  EDUCATION NEEDS: -No education needs identified at this time  INTERVENTION: 1. Patient refuses diet down grade to ease chewing and swallowing difficulties. 2. Patient requests swallowing evaluation. 3. RD to follow for nutrition needs.   Dietitian [phone]  DOCUMENTATION CODES Per approved criteria  -Morbid Obesity    Iven Finn [facility] 12/12/2011, 9:55 AM

## 2011-12-12 NOTE — Evaluation (Signed)
Physical Therapy Evaluation Patient Details Name: [name] MRN: [id number] DOB: 1966/08/06 Today's Date: 12/12/2011  Problem List:  Patient Active Problem List  Diagnoses  . PULMONARY SARCOIDOSIS  . HYPERLIPIDEMIA  . MORBID OBESITY  . ANEMIA  . ANXIETY  . HYPERTENSION  . Chronic diastolic heart failure  . ALLERGIC RHINITIS  . COPD  . CHRONIC RESPIRATORY FAILURE  . GERD  . AMENORRHEA  . FIBROMYALGIA  . HEADACHE, CHRONIC  . OBSTRUCTIVE SLEEP APNEA  . Sciatica of right side  . CHF (congestive heart failure)  . Ejection fraction  . Obesity hypoventilation syndrome  . COPD exacerbation  . CAP (community acquired pneumonia)  . Hypokalemia  . Diarrhea    Past Medical History:  Past Medical History  Diagnosis Date  . Sleep apnea     noncompliant w/ CPAP  . COPD (chronic obstructive pulmonary disease)     moderate airflow obstruction, suspect d/t emphysema  . Hypertension   . Hyperlipidemia   . Chronic headache   . Fibromyalgia     daily narcotics  . Anxiety     hx chronic BZ use, stopped 07/2010  . Anemia   . Pulmonary sarcoidosis     unimpressive CT chest 2011  . Colonic polyp   . GERD (gastroesophageal reflux disease)   . ALLERGIC RHINITIS   . Asthma   . CHF (congestive heart failure)     Diastolic with fluid overload, May, 2012, LVEF 60%  . Morbid obesity   . Pneumonia 12/11/11    "first time since 01/2010; I get it often"  . Heart murmur   . Angina   . Sarcoidosis of skin   . Emphysema   . Shortness of breath on exertion   . Chronic back pain greater than 3 months duration   . Depression   . PTSD (post-traumatic stress disorder)   . Panic attacks    Past Surgical History:  Past Surgical History  Procedure Date  . Polypectomy 2011  . Lumbar microdiscectomy 07/06/2011    R L4-5, stern  . Back surgery     PT Assessment/Plan/Recommendation PT Assessment Clinical Impression Statement: Pt admitted with SOB and COPD. Pt limited by respiratory  status and back pain. Educated for back precautions and mobility to maximize independence. Pt pleasant and states she would like to improve her function. Will follow acutely to maximize mobility and education before return home. Pt on 2L O2 throughout with HR 104-115 and O2 sats 88% with activity and 95% at rest. PT Recommendation/Assessment: Patient will need skilled PT in the acute care venue PT Problem List: Decreased strength;Decreased activity tolerance;Decreased mobility;Cardiopulmonary status limiting activity;Decreased knowledge of use of DME;Pain;Obesity Barriers to Discharge: Decreased caregiver support PT Therapy Diagnosis : Difficulty walking;Abnormality of gait PT Plan PT Frequency: Min 3X/week PT Treatment/Interventions: Gait training;Stair training;DME instruction;Functional mobility training;Therapeutic activities;Patient/family education PT Recommendation Recommendations for Other Services: OT consult Follow Up Recommendations: Outpatient PT Equipment Recommended: Rolling walker with 5" wheels PT Goals  Acute Rehab PT Goals PT Goal Formulation: With patient Time For Goal Achievement: 7 days Pt will go Supine/Side to Sit: with modified independence;with HOB 0 degrees PT Goal: Supine/Side to Sit - Progress: Goal set today Pt will go Sit to Supine/Side: with modified independence;with HOB 0 degrees PT Goal: Sit to Supine/Side - Progress: Goal set today Pt will Ambulate: >150 feet;with least restrictive assistive device;with modified independence;Other (comment) (300' without standing rest with O2 sat >90%) PT Goal: Ambulate - Progress: Goal set today Pt will Go  Up / Down Stairs: 1-2 stairs;with modified independence;with least restrictive assistive device PT Goal: Up/Down Stairs - Progress: Goal set today Pt will Perform Home Exercise Program: Independently PT Goal: Perform Home Exercise Program - Progress: Goal set today Additional Goals Additional Goal #1: Pt will  independently state 3/3 back precautions to decrease pain with mobility. PT Goal: Additional Goal #1 - Progress: Goal set today  PT Evaluation Precautions/Restrictions  Precautions Precaution Comments: O2 Restrictions Weight Bearing Restrictions: No Prior Functioning  Home Living Lives With: Other (Comment) (niece) Type of Home: Apartment Home Layout: One level Home Access: Stairs to enter [employer] of Steps: 2 Bathroom Shower/Tub: Engineer, manufacturing systems: Standard Home Adaptive Equipment: Shower chair with back Prior Function Level of Independence: Independent with basic ADLs;Independent with transfers;Independent with gait;Needs assistance with homemaking Light Housekeeping: Minimal Driving: Yes Vocation: On disability Comments: Pt states she had back surgery in Oct and is limited by back pain and respiratory status Cognition Cognition Arousal/Alertness: Awake/alert Overall Cognitive Status: Appears within functional limits for tasks assessed Sensation/Coordination Sensation Light Touch: Not tested Extremity Assessment RLE Assessment RLE Assessment: Exceptions to [facility] RLE Strength RLE Overall Strength Comments: 3/5 grossly limited by back pain LLE Assessment LLE Assessment: Exceptions to [facility] LLE Strength LLE Overall Strength Comments: 3+/5 hip and knee Mobility (including Balance) Bed Mobility Bed Mobility: No Transfers Transfers: Yes Sit to Stand: 6: Modified independent (Device/Increase time);From bed Stand to Sit: 6: Modified independent (Device/Increase time);To chair/3-in-1 (pt lacks complete control of descent) Ambulation/Gait Ambulation/Gait: Yes Ambulation/Gait Assistance: 6: Modified independent (Device/Increase time) Ambulation Distance (Feet): 350 Feet Assistive device: None Gait Pattern: Step-through pattern;Decreased stride length (increased sway and BOS) Stairs: No  Posture/Postural Control Posture/Postural Control: No  significant limitations Exercise  General Exercises - Lower Extremity Long Arc Quad: AROM;Both;10 reps;Seated Hip Flexion/Marching: AROM;Both;10 reps;Seated End of Session PT - End of Session Equipment Utilized During Treatment: Gait belt Activity Tolerance: Patient limited by fatigue;Patient limited by pain Patient left: in chair;with call bell in reach Nurse Communication: Mobility status for transfers;Mobility status for ambulation General Behavior During Session: [facility] for tasks performed Cognition: [facility] for tasks performed  Delorse Lek 12/12/2011, 9:38 AM  Delaney Meigs, PT [phone]

## 2011-12-12 NOTE — Clinical Documentation Improvement (Signed)
RESPIRATORY FAILURE DOCUMENTATION CLARIFICATION QUERY   THIS DOCUMENT IS NOT A PERMANENT PART OF THE MEDICAL RECORD  TO RESPOND TO THE THIS QUERY, FOLLOW THE INSTRUCTIONS BELOW:  1. If needed, update documentation for the patient's encounter via the notes activity.  2. Access this query again and click edit on the In [employer].  3. After updating, or not, click F2 to complete all highlighted (required) fields concerning your review. Select "additional documentation in the medical record" OR "no additional documentation provided".  4. Click Sign note button.  5. The deficiency will fall out of your In Basket *Please let us know if you are not able to complete this workflow by phone or e-mail (listed below).  Please update your documentation within the medical record to reflect your response to this query.                                                                                    12/12/11  Dear Gentry Fitz Marton Redwood,  In a better effort to capture your patient's severity of illness, reflect appropriate length of stay and utilization of resources, a review of the patient medical record has revealed the following indicators.    Based on your clinical judgment, please clarify and document in a progress note and/or discharge summary the clinical condition associated with the following supporting information:  In responding to this query please exercise your independent judgment.  The fact that a query is asked, does not imply that any particular answer is desired or expected.  Possible Clinical Conditions?  _______Chronic Respiratory Failure  _______Other Condition  _______Cannot Clinically Determine    Supporting Information:  Risk Factors: Patient with history of COPD and pulmonary sarcoid, with continuous home O2 @ 2L/min. per 3/18 progress notes.  Respiratory Treatment: continuous O2 at 2L/min.                   You may use possible, probable, or suspect with inpatient  documentation. possible, probable, suspected diagnoses MUST be documented at the time of discharge  Reviewed: Chronic respiratory failure, O2 dependent, addended in progress note  Thank You,  Marciano Sequin,  Clinical Documentation Specialist:  Pager: [phone]  Health Information Management [place]

## 2011-12-12 NOTE — Progress Notes (Signed)
Placed patient on CPAP QHS on her home settings of 12 cm H2O, Full face mask, and humidity.  Patient is tolerating CPAP well.

## 2011-12-13 LAB — CBC
HCT: 39.8 % (ref 36.0–46.0)
Hemoglobin: 12.8 g/dL (ref 12.0–15.0)
MCH: 26.4 pg (ref 26.0–34.0)
MCHC: 32.2 g/dL (ref 30.0–36.0)
MCV: 82.1 fL (ref 78.0–100.0)

## 2011-12-13 LAB — BASIC METABOLIC PANEL
BUN: 9 mg/dL (ref 6–23)
CO2: 29 mEq/L (ref 19–32)
Calcium: 8.9 mg/dL (ref 8.4–10.5)
Creatinine, Ser: 0.86 mg/dL (ref 0.50–1.10)
GFR calc non Af Amer: 81 mL/min — ABNORMAL LOW (ref >90)
Glucose, Bld: 193 mg/dL — ABNORMAL HIGH (ref 70–99)

## 2011-12-13 LAB — GLUCOSE, CAPILLARY: Glucose-Capillary: 196 mg/dL — ABNORMAL HIGH (ref 70–99)

## 2011-12-13 MED ORDER — TORSEMIDE 20 MG PO TABS
150.0000 mg | ORAL_TABLET | Freq: Two times a day (BID) | ORAL | Status: DC
  Administered 2011-12-13 – 2011-12-14 (×2): 150 mg via ORAL
  Filled 2011-12-13 (×3): qty 1

## 2011-12-13 NOTE — Progress Notes (Signed)
Pt states she has not had a BM since Sunday 3/17.  She said she had a small amount of diarrhea prior to admission and knows what C-Diff is and she knows she did not have it.  Per protocol pt removed from contact precautions. Thomas Hoff

## 2011-12-13 NOTE — Progress Notes (Signed)
Physical Therapy Treatment Patient Details Name: [name] MRN: [id number] DOB: [birth year] Today's Date: 12/13/2011  PT Assessment/Plan  PT - Assessment/Plan Comments on Treatment Session: Pt's mobility significantly limited by back pain.  Continue to recommend outpt PT to further address this and 4-wheel rollator for energy conservation and to have a seat when back pain occurs during ambulation.  PT will continue to follow. PT Plan: Discharge plan remains appropriate;Frequency remains appropriate PT Frequency: Min 3X/week Recommendations for Other Services: OT consult Follow Up Recommendations: Outpatient PT Equipment Recommended: Other (comment) (4-wheel rollator with seat) PT Goals  Acute Rehab PT Goals PT Goal Formulation: With patient Time For Goal Achievement: 7 days Pt will go Supine/Side to Sit: with modified independence;with HOB 0 degrees PT Goal: Supine/Side to Sit - Progress: Progressing toward goal Pt will go Sit to Supine/Side: with modified independence;with HOB 0 degrees PT Goal: Sit to Supine/Side - Progress: Progressing toward goal Pt will Ambulate: >150 feet;with least restrictive assistive device;with modified independence;Other (comment) PT Goal: Ambulate - Progress: Progressing toward goal Pt will Go Up / Down Stairs: 1-2 stairs;with modified independence;with least restrictive assistive device PT Goal: Up/Down Stairs - Progress: Other (comment) (NT) Pt will Perform Home Exercise Program: Independently PT Goal: Perform Home Exercise Program - Progress: Progressing toward goal Additional Goals Additional Goal #1: Pt will independently state 3/3 back precautions to decrease pain with mobility. PT Goal: Additional Goal #1 - Progress: Progressing toward goal  PT Treatment Precautions/Restrictions  Precautions Precautions: Back;Other (comment) (for decreased pain) Precaution Comments: O2 Required Braces or Orthoses: No Restrictions Weight Bearing  Restrictions: No Mobility (including Balance) Bed Mobility Bed Mobility: Yes Supine to Sit: 6: Modified independent (Device/Increase time) Sit to Supine: 6: Modified independent (Device/Increase time) Transfers Transfers: Yes Sit to Stand: 6: Modified independent (Device/Increase time);From bed Stand to Sit: 6: Modified independent (Device/Increase time);To chair/3-in-1 Ambulation/Gait Ambulation/Gait: Yes Ambulation/Gait Assistance: 5: Supervision Ambulation/Gait Assistance Details (indicate cue type and reason): pt with increasing back pain during ambulation, tends to lean over RW with forearms on handles when pain increases, encouraged to keep back our of flexed posture.  Recommend 4 wheel rollator for energy conservation and so that pt can sit when back pain occurs instead of leaning over RW.  3 standing rest breaks x20 sec Ambulation Distance (Feet): 200 Feet Assistive device: Rolling walker Gait Pattern: Step-through pattern;Decreased stride length Gait velocity: decreased Stairs: No Wheelchair Mobility Wheelchair Mobility: No  Posture/Postural Control Posture/Postural Control: No significant limitations Balance Balance Assessed: Yes Dynamic Standing Balance Dynamic Standing - Balance Support: No upper extremity supported;During functional activity Dynamic Standing - Level of Assistance: 5: Stand by assistance    End of Session PT - End of Session Equipment Utilized During Treatment: Gait belt;Other (comment) (O2) Activity Tolerance: Patient limited by pain Patient left: in bed;with call bell in reach;with family/visitor present Nurse Communication: Mobility status for ambulation;Mobility status for transfers General Behavior During Session: [facility] for tasks performed Cognition: [facility] for tasks performed  Haylie Mccutcheon, [place]  [phone] 12/13/2011, 3:22 PM

## 2011-12-13 NOTE — Progress Notes (Signed)
Patient has placed herself on CPAP QHS.  Patient tolerating CPAP well. 

## 2011-12-13 NOTE — Progress Notes (Signed)
Inpatient Diabetes Program Recommendations  AACE/ADA: New Consensus Statement on Inpatient Glycemic Control (2009)  Target Ranges:  Prepandial:   less than 140 mg/dL      Peak postprandial:   less than 180 mg/dL (1-2 hours)      Critically ill patients:  140 - 180 mg/dL   Results for CELSEY, ASSELIN (MRN [id number]) as of 12/13/2011 10:15  Ref. Range 12/12/2011 07:04 12/12/2011 11:48 12/12/2011 16:33 12/12/2011 22:09 12/13/2011 05:58  Glucose-Capillary Latest Range: 70-99 mg/dL 621 (H) 308 (H) 657 (H) 226 (H) 196 (H)    Inpatient Diabetes Program Recommendations HgbA1C: Please order A1C to assess pre-hospital glucose control  Thank you  Piedad Climes RN,BSN,CDE Inpatient Diabetes Coordinator

## 2011-12-13 NOTE — Progress Notes (Signed)
Subjective: Pt states that she's had an increase in weight over her usual dry weight. She also states tha she feels that she is having more difficulty in breathing than usual. Objective: Filed Vitals:   12/13/11 0215 12/13/11 0230 12/13/11 0533 12/13/11 0556  BP:   110/74   Pulse:  88 93   Temp:   98.3 F (36.8 C)   TempSrc:   Oral   Resp:  18 18   Height:      Weight:    142.7 kg (314 lb 9.5 oz)  SpO2: 96% 96% 94%    Weight change: 1.8 kg (3 lb 15.5 oz)  Intake/Output Summary (Last 24 hours) at 12/13/11 0753 Last data filed at 12/12/11 1732  Gross per 24 hour  Intake   1900 ml  Output   1200 ml  Net    700 ml    General: Alert, awake, oriented x3, in no acute distress.  HEENT: [place]/AT PEERL, EOMI Neck: Trachea midline,  no masses, no thyromegal,y no JVD, no carotid bruit OROPHARYNX:  Moist, No exudate/ erythema/lesions.  Heart: Regular rate and rhythm, without murmurs, rubs, gallops, PMI non-displaced, no heaves or thrills on palpation.  Lungs: Bibasilar crackles. No increased vocal fremitus resonant to percussion  Abdomen: Obese, soft, nontender, nondistended, positive bowel sounds, no masses no hepatosplenomegaly noted..  Neuro: No focal neurological deficits noted cranial nerves II through XII grossly intact. DTRs 2+ bilaterally upper and lower extremities. Strength functional in bilateral upper and lower extremities. Musculoskeletal: No warm swelling or erythema around joints, no spinal tenderness noted. Psychiatric: Patient alert and oriented x3, good insight and cognition, good recent to remote recall. Lymph node survey: No cervical axillary or inguinal lymphadenopathy noted.     Lab Results:  Basename 12/13/11 0615 12/12/11 0500 12/11/11 1000  NA 135 138 --  K 3.5 3.1* --  CL 95* 96 --  CO2 29 29 --  GLUCOSE 193* 158* --  BUN 9 9 --  CREATININE 0.86 0.80 --  CALCIUM 8.9 8.3* --  MG -- -- 1.6  PHOS -- -- --    Basename 12/11/11 1000 12/10/11 2314  AST 42*  62*  ALT 60* 72*  ALKPHOS 129* 135*  BILITOT 0.9 0.8  PROT 7.1 7.7  ALBUMIN 3.2* 3.6   No results found for this basename: LIPASE:2,AMYLASE:2 in the last 72 hours  Basename 12/13/11 0615 12/12/11 0500 12/10/11 2314  WBC 5.0 5.1 --  NEUTROABS -- -- 4.3  HGB 12.8 12.8 --  HCT 39.8 38.3 --  MCV 82.1 80.1 --  PLT 157 146* --    Basename 12/11/11 1939 12/11/11 1135 12/11/11 0405  CKTOTAL 122 145 127  CKMB 1.8 1.7 1.4  CKMBINDEX -- -- --  TROPONINI <0.30 <0.30 <0.30   No components found with this basename: POCBNP:3 No results found for this basename: DDIMER:2 in the last 72 hours No results found for this basename: HGBA1C:2 in the last 72 hours No results found for this basename: CHOL:2,HDL:2,LDLCALC:2,TRIG:2,CHOLHDL:2,LDLDIRECT:2 in the last 72 hours  Basename 12/11/11 1000  TSH 3.755  T4TOTAL --  T3FREE --  THYROIDAB --   No results found for this basename: VITAMINB12:2,FOLATE:2,FERRITIN:2,TIBC:2,IRON:2,RETICCTPCT:2 in the last 72 hours  Micro Results: Recent Results (from the past 240 hour(s))  MRSA PCR SCREENING     Status: Normal   Collection Time   12/11/11  4:11 AM      Component Value Range Status Comment   MRSA by PCR NEGATIVE  NEGATIVE  Final  Studies/Results: Dg Chest 2 View  12/11/2011  *RADIOLOGY REPORT*  Clinical Data: Shortness of breath  CHEST - 2 VIEW  Comparison: 11/17/2011  Findings: Heart size is normal.  There is no pleural effusion or edema.  Chronic interstitial coarsening and bibasilar scarring is noted.  Superimposed airspace opacity is identified within the left base which may represent an acute infiltrate.  The visualized osseous structures are unremarkable.  IMPRESSION:  1.  Advanced interstitial coarsening and bibasilar scarring 2.  Left base opacity suspicious for superimposed infiltrate.  Original Report Authenticated By: Rosealee Albee, M.D.   Dg Chest 2 View  11/17/2011  *RADIOLOGY REPORT*  Clinical Data: Cough and shortness of  breath.  CHEST - 2 VIEW  Comparison: 10/25/2011.  Findings: The cardiac silhouette remains borderline enlarged.  No significant change in prominence of the interstitial markings with linear densities in both lower lung zones.  Stable mild diffuse peribronchial thickening.  Unremarkable bones.  IMPRESSION: Stable borderline cardiomegaly and chronic interstitial lung disease with bibasilar scarring.  Original Report Authenticated By: Darrol Angel, M.D.    Medications: I have reviewed the patient's current medications. Scheduled Meds:   . albuterol  2.5 mg Nebulization Q6H  . ALPRAZolam  1 mg Oral BID  . amLODipine  5 mg Oral Daily  . aspirin EC  81 mg Oral Daily  . budesonide-formoterol  2 puff Inhalation BID  . enoxaparin (LOVENOX) injection  30 mg Subcutaneous NOW  . enoxaparin (LOVENOX) injection  70 mg Subcutaneous Q24H  . fluticasone  2 spray Each Nare Daily  . insulin aspart  0-9 Units Subcutaneous TID WC  . ipratropium  0.5 mg Nebulization Q6H  . levofloxacin (LEVAQUIN) IV  750 mg Intravenous Q24H  . loratadine  10 mg Oral Daily  . oxyCODONE  10 mg Oral BID  . pantoprazole  40 mg Oral Daily  . piperacillin-tazobactam (ZOSYN)  IV  3.375 g Intravenous Q8H  . potassium chloride  40 mEq Oral BID  . potassium chloride  40 mEq Oral Once  . sertraline  25 mg Oral Daily  . sodium chloride  3 mL Intravenous Q12H  . sodium chloride  3 mL Intravenous Q12H  . spironolactone  25 mg Oral Daily  . torsemide  100 mg Oral BID  . vancomycin  1,250 mg Intravenous Q12H  . DISCONTD: enoxaparin  40 mg Subcutaneous Q24H   Continuous Infusions:  PRN Meds:.acetaminophen, acetaminophen, albuterol, ondansetron (ZOFRAN) IV, ondansetron, promethazine Assessment/Plan: Patient Active Hospital Problem List: HCAP (Healthcare acquired pneumonia) (12/11/2011)   Assessment: Pt has a h/o of pulmonary sarcoid. Im light of the suspected pneumonia, pt was started on broad spectrum antibiotics. Pt has improved  clinically  on 3 days of broad spectrum Abx. I will narrow the spectrum to include Levaquin as pt shows no evidence of MRSA or anaerobic infection. Will continue Levaquin.  COPD (04/26/2010)   Assessment: This appears to be quiescent. Will continue current regimen.    OBSTRUCTIVE SLEEP APNEA (12/07/2010)   Assessment: continue CPAP   CHF (congestive heart failure) ()   Assessment: Pt has acute on chronic diastolic HF with an increase in weight gain.    Plan: Will increase Torsemide to 150 mg PO BID.  Hypokalemia (12/11/2011)   Assessment: Repleted    Diarrhea (12/11/2011)   Assessment: resolved     LOS: 3 days

## 2011-12-13 NOTE — Progress Notes (Signed)
12/12/2011 - 20:55 - Patient not ready to wear CPAP at this time.  Gypsy Decant, RRT, RCP.  12/13/2011 - 00:47 - Patient is wide awake and not ready to go on CPAP at this time.  Patient stated that she would placed herself on CPAP when she is ready to sleep as is her practice when she is at home.  Patient agreed to have the RN notify the RT if she needed any assistance with CPAP.  Gypsy Decant, RRT, RCP

## 2011-12-14 ENCOUNTER — Telehealth: Payer: Self-pay | Admitting: Pulmonary Disease

## 2011-12-14 LAB — BASIC METABOLIC PANEL
CO2: 31 mEq/L (ref 19–32)
Calcium: 8.6 mg/dL (ref 8.4–10.5)
Chloride: 97 mEq/L (ref 96–112)
Creatinine, Ser: 0.81 mg/dL (ref 0.50–1.10)
GFR calc Af Amer: >90 mL/min (ref >90)
Sodium: 138 mEq/L (ref 135–145)

## 2011-12-14 LAB — GLUCOSE, CAPILLARY
Glucose-Capillary: 169 mg/dL — ABNORMAL HIGH (ref 70–99)
Glucose-Capillary: 199 mg/dL — ABNORMAL HIGH (ref 70–99)

## 2011-12-14 LAB — HEMOGLOBIN A1C
Hgb A1c MFr Bld: 7.5 % — ABNORMAL HIGH (ref <5.7)
Mean Plasma Glucose: 169 mg/dL — ABNORMAL HIGH (ref <117)

## 2011-12-14 LAB — CBC
MCH: 27.1 pg (ref 26.0–34.0)
Platelets: 145 10*3/uL — ABNORMAL LOW (ref 150–400)
RBC: 4.61 MIL/uL (ref 3.87–5.11)
RDW: 17.6 % — ABNORMAL HIGH (ref 11.5–15.5)
WBC: 5.9 10*3/uL (ref 4.0–10.5)

## 2011-12-14 MED ORDER — FUROSEMIDE 10 MG/ML IJ SOLN
200.0000 mg | Freq: Two times a day (BID) | INTRAVENOUS | Status: DC
  Administered 2011-12-14 – 2011-12-17 (×7): 200 mg via INTRAVENOUS
  Filled 2011-12-14 (×10): qty 20

## 2011-12-14 NOTE — Progress Notes (Signed)
Subjective: Pt has been drinking excessive amounts of fluids and is upset with the nurse that she has been advised to limit her fluid intake. I had a long discussion with the patient and she has agreed to limit her fluid intake to 2L of fluid/day. Objective: Filed Vitals:   12/14/11 0510 12/14/11 0700 12/14/11 0815 12/14/11 1300  BP: 131/79  118/79 108/67  Pulse: 93  98 99  Temp: 98 F (36.7 C)  97 F (36.1 C) 97.1 F (36.2 C)  TempSrc: Oral  Axillary Axillary  Resp: 18  19 19   Height:      Weight: 141.4 kg (311 lb 11.7 oz)     SpO2: 99% 98% 98% 98%   Weight change: -1.3 kg (-2 lb 13.9 oz)  Intake/Output Summary (Last 24 hours) at 12/14/11 1959 Last data filed at 12/14/11 1900  Gross per 24 hour  Intake   1185 ml  Output   3600 ml  Net  -2415 ml    General: Alert, awake, oriented x3, in no acute distress.  HEENT: [place]/AT [place], EOMI Neck: Trachea midline,  no masses, no thyromegal,y no JVD, no carotid bruit OROPHARYNX:  Moist, No exudate/ erythema/lesions.  Heart: Regular rate and rhythm, without murmurs, rubs, gallops, PMI non-displaced, no heaves or thrills on palpation.  Lungs: Bibasilar crackles. No increased vocal fremitus resonant to percussion  Abdomen: Obese, soft, nontender, nondistended, positive bowel sounds, no masses no hepatosplenomegaly noted..  Neuro: No focal neurological deficits noted cranial nerves II through XII grossly intact. DTRs 2+ bilaterally upper and lower extremities. Strength functional in bilateral upper and lower extremities. Musculoskeletal: No warm swelling or erythema around joints, no spinal tenderness noted. Psychiatric: Patient alert and oriented x3, good insight and cognition, good recent to remote recall. Lymph node survey: No cervical axillary or inguinal lymphadenopathy noted.     Lab Results:  [facility] 12/14/11 0654 12/13/11 0615  NA 138 135  K 3.9 3.5  CL 97 95*  CO2 31 29  GLUCOSE 156* 193*  BUN 8 9  CREATININE 0.81 0.86    CALCIUM 8.6 8.9  MG -- --  PHOS -- --   No results found for this basename: AST:2,ALT:2,ALKPHOS:2,BILITOT:2,PROT:2,ALBUMIN:2 in the last 72 hours No results found for this basename: LIPASE:2,AMYLASE:2 in the last 72 hours  Basename 12/14/11 0654 12/13/11 0615  WBC 5.9 5.0  NEUTROABS -- --  HGB 12.5 12.8  HCT 37.6 39.8  MCV 81.6 82.1  PLT 145* 157   No results found for this basename: CKTOTAL:3,CKMB:3,CKMBINDEX:3,TROPONINI:3 in the last 72 hours No components found with this basename: POCBNP:3 No results found for this basename: DDIMER:2 in the last 72 hours  Basename 12/14/11 0654  HGBA1C 7.5*   No results found for this basename: CHOL:2,HDL:2,LDLCALC:2,TRIG:2,CHOLHDL:2,LDLDIRECT:2 in the last 72 hours No results found for this basename: TSH,T4TOTAL,FREET3,T3FREE,THYROIDAB in the last 72 hours No results found for this basename: VITAMINB12:2,FOLATE:2,FERRITIN:2,TIBC:2,IRON:2,RETICCTPCT:2 in the last 72 hours  Micro Results: Recent Results (from the past 240 hour(s))  MRSA PCR SCREENING     Status: Normal   Collection Time   12/11/11  4:11 AM      Component Value Range Status Comment   MRSA by PCR NEGATIVE  NEGATIVE  Final     Studies/Results: Dg Chest 2 View  12/11/2011  *RADIOLOGY REPORT*  Clinical Data: Shortness of breath  CHEST - 2 VIEW  Comparison: 11/17/2011  Findings: Heart size is normal.  There is no pleural effusion or edema.  Chronic interstitial coarsening and bibasilar scarring  is noted.  Superimposed airspace opacity is identified within the left base which may represent an acute infiltrate.  The visualized osseous structures are unremarkable.  IMPRESSION:  1.  Advanced interstitial coarsening and bibasilar scarring 2.  Left base opacity suspicious for superimposed infiltrate.  Original Report Authenticated By: Rosealee Albee, M.D.   Dg Chest 2 View  11/17/2011  *RADIOLOGY REPORT*  Clinical Data: Cough and shortness of breath.  CHEST - 2 VIEW  Comparison:  10/25/2011.  Findings: The cardiac silhouette remains borderline enlarged.  No significant change in prominence of the interstitial markings with linear densities in both lower lung zones.  Stable mild diffuse peribronchial thickening.  Unremarkable bones.  IMPRESSION: Stable borderline cardiomegaly and chronic interstitial lung disease with bibasilar scarring.  Original Report Authenticated By: Darrol Angel, M.D.    Medications: I have reviewed the patient's current medications. Scheduled Meds:    . albuterol  2.5 mg Nebulization Q6H  . ALPRAZolam  1 mg Oral BID  . amLODipine  5 mg Oral Daily  . aspirin EC  81 mg Oral Daily  . budesonide-formoterol  2 puff Inhalation BID  . enoxaparin (LOVENOX) injection  70 mg Subcutaneous Q24H  . fluticasone  2 spray Each Nare Daily  . furosemide  200 mg Intravenous Q12H  . insulin aspart  0-9 Units Subcutaneous TID WC  . ipratropium  0.5 mg Nebulization Q6H  . levofloxacin (LEVAQUIN) IV  750 mg Intravenous Q24H  . loratadine  10 mg Oral Daily  . oxyCODONE  10 mg Oral BID  . pantoprazole  40 mg Oral Daily  . potassium chloride  40 mEq Oral BID  . sertraline  25 mg Oral Daily  . sodium chloride  3 mL Intravenous Q12H  . sodium chloride  3 mL Intravenous Q12H  . spironolactone  25 mg Oral Daily  . DISCONTD: torsemide  150 mg Oral BID   Continuous Infusions:  PRN Meds:.acetaminophen, acetaminophen, albuterol, ondansetron (ZOFRAN) IV, ondansetron, promethazine Assessment/Plan: Patient Active Hospital Problem List: HCAP (Healthcare acquired pneumonia) (12/11/2011)   Assessment: Pt has a h/o of pulmonary sarcoid. Im light of the suspected pneumonia, pt was started on broad spectrum antibiotics. Pt has improved clinically  on 3 days of broad spectrum Abx. I will narrow the spectrum to include Levaquin as pt shows no evidence of MRSA or anaerobic infection. Will continue Levaquin.  COPD (04/26/2010)   Assessment: This appears to be quiescent. Will  continue current regimen.    OBSTRUCTIVE SLEEP APNEA (12/07/2010)   Assessment: continue CPAP   CHF (congestive heart failure) ()   Assessment: Pt has acute on chronic diastolic HF with an increase in weight gain.    Plan: Will change Torsemide to 100 mg IV BID.  Hypokalemia (12/11/2011)   Assessment: Repleted    Diarrhea (12/11/2011)   Assessment: resolved     LOS: 4 days

## 2011-12-14 NOTE — Telephone Encounter (Signed)
Will forward to KC as an FYI 

## 2011-12-14 NOTE — Progress Notes (Signed)
Pt upset because she had no breakfast.  I called dietary again to check status of tray.  Pt's family member arrived while I was on phone and pt said to cancel tray because her family had brought her food.  New tray arrived at 10:00am. Pt eating at this time.  Will continue to monitor. Thomas Hoff

## 2011-12-14 NOTE — Progress Notes (Signed)
Pt upset because wrong breakfast tray sent. Called dietary to order new tray.  Asked pt about fluid consumption and if she monitored her fluids because of hx of CHF.  She explained that she drank a lot and had been for years and her doctor was aware.  Will continue to monitor. Thomas Hoff

## 2011-12-15 LAB — BASIC METABOLIC PANEL
Calcium: 9.4 mg/dL (ref 8.4–10.5)
Creatinine, Ser: 1.01 mg/dL (ref 0.50–1.10)
GFR calc non Af Amer: 67 mL/min — ABNORMAL LOW (ref >90)
Glucose, Bld: 170 mg/dL — ABNORMAL HIGH (ref 70–99)
Sodium: 134 mEq/L — ABNORMAL LOW (ref 135–145)

## 2011-12-15 LAB — GLUCOSE, CAPILLARY: Glucose-Capillary: 264 mg/dL — ABNORMAL HIGH (ref 70–99)

## 2011-12-15 MED ORDER — ALBUTEROL SULFATE (5 MG/ML) 0.5% IN NEBU
2.5000 mg | INHALATION_SOLUTION | Freq: Three times a day (TID) | RESPIRATORY_TRACT | Status: DC
  Administered 2011-12-15 – 2011-12-17 (×6): 2.5 mg via RESPIRATORY_TRACT
  Filled 2011-12-15 (×7): qty 0.5

## 2011-12-15 MED ORDER — LEVOFLOXACIN 750 MG PO TABS
750.0000 mg | ORAL_TABLET | ORAL | Status: DC
  Administered 2011-12-16 (×2): 750 mg via ORAL
  Filled 2011-12-15 (×3): qty 1

## 2011-12-15 MED ORDER — INSULIN ASPART 100 UNIT/ML ~~LOC~~ SOLN
0.0000 [IU] | Freq: Every day | SUBCUTANEOUS | Status: DC
  Administered 2011-12-16: 01:00:00 via SUBCUTANEOUS

## 2011-12-15 MED ORDER — INSULIN GLARGINE 100 UNIT/ML ~~LOC~~ SOLN
10.0000 [IU] | Freq: Every day | SUBCUTANEOUS | Status: DC
  Administered 2011-12-16: 01:00:00 via SUBCUTANEOUS
  Administered 2011-12-16: 10 [IU] via SUBCUTANEOUS

## 2011-12-15 MED ORDER — IPRATROPIUM BROMIDE 0.02 % IN SOLN
0.5000 mg | Freq: Three times a day (TID) | RESPIRATORY_TRACT | Status: DC
  Administered 2011-12-15 – 2011-12-17 (×6): 0.5 mg via RESPIRATORY_TRACT
  Filled 2011-12-15 (×7): qty 2.5

## 2011-12-15 MED ORDER — INSULIN ASPART 100 UNIT/ML ~~LOC~~ SOLN
0.0000 [IU] | Freq: Three times a day (TID) | SUBCUTANEOUS | Status: DC
  Administered 2011-12-16 (×3): 7 [IU] via SUBCUTANEOUS
  Administered 2011-12-17: 4 [IU] via SUBCUTANEOUS
  Administered 2011-12-17: 3 [IU] via SUBCUTANEOUS
  Administered 2011-12-17: 4 [IU] via SUBCUTANEOUS

## 2011-12-15 NOTE — Progress Notes (Signed)
Inpatient Diabetes Program Recommendations  AACE/ADA: New Consensus Statement on Inpatient Glycemic Control (2009)  Target Ranges:  Prepandial:   less than 140 mg/dL      Peak postprandial:   less than 180 mg/dL (1-2 hours)      Critically ill patients:  140 - 180 mg/dL   Inpatient Diabetes Program Recommendations Insulin - Basal: start Lantus 10 units Oral Agents: consider adding Metformin 500 mg if not contraindicated HgbA1C: =7.5 Outpatient Referral: Needs OP referral for [facility]   Thank you  Piedad Climes RN,BSN,CDE Inpatient Diabetes Coordinator

## 2011-12-15 NOTE — Progress Notes (Signed)
Subjective: Pt continues to complain of polydipsia. The patient's hemoglobin A1c is still not back and still pending at this time. However I suspect with the elevation of fasting blood sugars the patient is likely diabetic. Objective: Filed Vitals:   12/15/11 1435 12/15/11 1500 12/15/11 1956 12/15/11 2120  BP: 122/86   134/88  Pulse: 103   101  Temp: 97.5 F (36.4 C)   98 F (36.7 C)  TempSrc: Oral   Oral  Resp: 18   18  Height:      Weight:      SpO2: 98% 88% 95% 97%   Weight change: -1.1 kg (-2 lb 6.8 oz)  Intake/Output Summary (Last 24 hours) at 12/15/11 2132 Last data filed at 12/15/11 1827  Gross per 24 hour  Intake    480 ml  Output   7101 ml  Net  -6621 ml    General: Alert, awake, oriented x3, in no acute distress.  HEENT: [place]/AT [place], EOMI Neck: Trachea midline,  no masses, no thyromegal,y no JVD, no carotid bruit OROPHARYNX:  Moist, No exudate/ erythema/lesions.  Heart: Regular rate and rhythm, without murmurs, rubs, gallops, PMI non-displaced, no heaves or thrills on palpation.  Lungs: Bibasilar crackles. No increased vocal fremitus resonant to percussion  Abdomen: Obese, soft, nontender, nondistended, positive bowel sounds, no masses no hepatosplenomegaly noted..  Neuro: No focal neurological deficits noted cranial nerves II through XII grossly intact. DTRs 2+ bilaterally upper and lower extremities. Strength functional in bilateral upper and lower extremities. Musculoskeletal: No warm swelling or erythema around joints, no spinal tenderness noted. Psychiatric: Patient alert and oriented x3, good insight and cognition, good recent to remote recall. Lymph node survey: No cervical axillary or inguinal lymphadenopathy noted.     Lab Results:  [facility] 12/15/11 0545 12/14/11 0654  NA 134* 138  K 3.8 3.9  CL 94* 97  CO2 28 31  GLUCOSE 170* 156*  BUN 12 8  CREATININE 1.01 0.81  CALCIUM 9.4 8.6  MG -- --  PHOS -- --   No results found for this basename:  AST:2,ALT:2,ALKPHOS:2,BILITOT:2,PROT:2,ALBUMIN:2 in the last 72 hours No results found for this basename: LIPASE:2,AMYLASE:2 in the last 72 hours  Basename 12/14/11 0654 12/13/11 0615  WBC 5.9 5.0  NEUTROABS -- --  HGB 12.5 12.8  HCT 37.6 39.8  MCV 81.6 82.1  PLT 145* 157   No results found for this basename: CKTOTAL:3,CKMB:3,CKMBINDEX:3,TROPONINI:3 in the last 72 hours No components found with this basename: POCBNP:3 No results found for this basename: DDIMER:2 in the last 72 hours  Basename 12/14/11 0654  HGBA1C 7.5*   No results found for this basename: CHOL:2,HDL:2,LDLCALC:2,TRIG:2,CHOLHDL:2,LDLDIRECT:2 in the last 72 hours No results found for this basename: TSH,T4TOTAL,FREET3,T3FREE,THYROIDAB in the last 72 hours No results found for this basename: VITAMINB12:2,FOLATE:2,FERRITIN:2,TIBC:2,IRON:2,RETICCTPCT:2 in the last 72 hours  Micro Results: Recent Results (from the past 240 hour(s))  MRSA PCR SCREENING     Status: Normal   Collection Time   12/11/11  4:11 AM      Component Value Range Status Comment   MRSA by PCR NEGATIVE  NEGATIVE  Final     Studies/Results: Dg Chest 2 View  12/11/2011  *RADIOLOGY REPORT*  Clinical Data: Shortness of breath  CHEST - 2 VIEW  Comparison: 11/17/2011  Findings: Heart size is normal.  There is no pleural effusion or edema.  Chronic interstitial coarsening and bibasilar scarring is noted.  Superimposed airspace opacity is identified within the left base which may represent an acute infiltrate.  The visualized osseous structures are unremarkable.  IMPRESSION:  1.  Advanced interstitial coarsening and bibasilar scarring 2.  Left base opacity suspicious for superimposed infiltrate.  Original Report Authenticated By: Rosealee Albee, M.D.   Dg Chest 2 View  11/17/2011  *RADIOLOGY REPORT*  Clinical Data: Cough and shortness of breath.  CHEST - 2 VIEW  Comparison: 10/25/2011.  Findings: The cardiac silhouette remains borderline enlarged.  No  significant change in prominence of the interstitial markings with linear densities in both lower lung zones.  Stable mild diffuse peribronchial thickening.  Unremarkable bones.  IMPRESSION: Stable borderline cardiomegaly and chronic interstitial lung disease with bibasilar scarring.  Original Report Authenticated By: Darrol Angel, M.D.    Medications: I have reviewed the patient's current medications. Scheduled Meds:    . albuterol  2.5 mg Nebulization TID  . ALPRAZolam  1 mg Oral BID  . amLODipine  5 mg Oral Daily  . aspirin EC  81 mg Oral Daily  . budesonide-formoterol  2 puff Inhalation BID  . enoxaparin (LOVENOX) injection  70 mg Subcutaneous Q24H  . fluticasone  2 spray Each Nare Daily  . furosemide  200 mg Intravenous Q12H  . insulin aspart  0-9 Units Subcutaneous TID WC  . ipratropium  0.5 mg Nebulization TID  . levofloxacin  750 mg Oral Q24H  . loratadine  10 mg Oral Daily  . oxyCODONE  10 mg Oral BID  . pantoprazole  40 mg Oral Daily  . potassium chloride  40 mEq Oral BID  . sertraline  25 mg Oral Daily  . sodium chloride  3 mL Intravenous Q12H  . sodium chloride  3 mL Intravenous Q12H  . spironolactone  25 mg Oral Daily  . DISCONTD: albuterol  2.5 mg Nebulization Q6H  . DISCONTD: ipratropium  0.5 mg Nebulization Q6H  . DISCONTD: levofloxacin (LEVAQUIN) IV  750 mg Intravenous Q24H   Continuous Infusions:  PRN Meds:.acetaminophen, acetaminophen, albuterol, ondansetron (ZOFRAN) IV, ondansetron, promethazine Assessment/Plan: Patient Active Hospital Problem List: HCAP (Healthcare acquired pneumonia) (12/11/2011)   Assessment: Pt has a h/o of pulmonary sarcoid. Im light of the suspected pneumonia, pt was started on broad spectrum antibiotics. Pt has improved clinically  on 3 days of broad spectrum Abx. I will narrow the spectrum to include Levaquin as pt shows no evidence of MRSA or anaerobic infection. Will continue Levaquin.  COPD (04/26/2010)   Assessment: This appears to  be quiescent. Will continue current regimen.    OBSTRUCTIVE SLEEP APNEA (12/07/2010)   Assessment: continue CPAP   CHF (congestive heart failure) ()   Assessment: Pt has acute on chronic diastolic HF with an increase in weight gain.    Plan: Continue IV Lasix and reevaluate him tomorrow.  Hypokalemia (12/11/2011)   Assessment: Repleted    Hyperglycemia (12/11/2011)   Assessment: Hemoglobin A1c still pending. We'll followup in started on insulin if greater than 6.8.      LOS: 5 days

## 2011-12-15 NOTE — Progress Notes (Signed)
Pt. Stated that she would put CPAP on herself when ready for bed.

## 2011-12-15 NOTE — Progress Notes (Signed)
Physical Therapy Cancellation note: pt sidelying in bed and refusing any mobility at this time despite education for improvement of back pain with movement and benefit for her lungs. Ms. [name] states she is too tired and in back pain and will not participate at this time. Will attempt at later date. Thanks Delaney Meigs, PT [phone]

## 2011-12-15 NOTE — Progress Notes (Addendum)
ANTIBIOTIC CONSULT NOTE - FOLLOW UP  Pharmacy Consult for Levofloxacin Indication: pneumonia  Allergies  Allergen Reactions  . Ciprofloxacin     REACTION: nausea  . Latex     REACTION: rash, burning  . Sulfamethoxazole W/Trimethoprim     Bactrim REACTION: Projectile Vomiting  . Azithromycin     REACTION: resistant  . Doxycycline     REACTION: restistant    Patient Measurements: Height: 5\' 9"  (175.3 cm) Weight: 309 lb 4.9 oz (140.3 kg) IBW/kg (Calculated) : 66.2   Vital Signs: Temp: 98 F (36.7 C) (03/22 0536) Temp src: Oral (03/22 0536) BP: 119/73 mmHg (03/22 0536) Pulse Rate: 93  (03/22 0536) Intake/Output from previous day: 03/21 0701 - 03/22 0700 In: 1185 [P.O.:1182; I.V.:3] Out: 6350 [Urine:6350] Intake/Output from this shift:    Labs:  Basename 12/15/11 0545 12/14/11 0654 12/13/11 0615  WBC -- 5.9 5.0  HGB -- 12.5 12.8  PLT -- 145* 157  LABCREA -- -- --  CREATININE 1.01 0.81 0.86   Estimated Creatinine Clearance: 107.5 ml/min (by C-G formula based on Cr of 1.01). No results found for this basename: VANCOTROUGH:2,VANCOPEAK:2,VANCORANDOM:2,GENTTROUGH:2,GENTPEAK:2,GENTRANDOM:2,TOBRATROUGH:2,TOBRAPEAK:2,TOBRARND:2,AMIKACINPEAK:2,AMIKACINTROU:2,AMIKACIN:2, in the last 72 hours   Microbiology: Recent Results (from the past 720 hour(s))  MRSA PCR SCREENING     Status: Normal   Collection Time   12/11/11  4:11 AM      Component Value Range Status Comment   MRSA by PCR NEGATIVE  NEGATIVE  Final     Anti-infectives     Start     Dose/Rate Route Frequency Ordered Stop   12/12/11 0400   vancomycin (VANCOCIN) 1,250 mg in sodium chloride 0.9 % 250 mL IVPB  Status:  Discontinued        1,250 mg 166.7 mL/hr over 90 Minutes Intravenous Every 12 hours 12/11/11 1544 12/13/11 1533   12/12/11 0200   vancomycin (VANCOCIN) 1,250 mg in sodium chloride 0.9 % 250 mL IVPB  Status:  Discontinued        1,250 mg 166.7 mL/hr over 90 Minutes Intravenous Every 12 hours 12/11/11  0759 12/11/11 1544   12/12/11 0000   Levofloxacin (LEVAQUIN) IVPB 750 mg        750 mg 100 mL/hr over 90 Minutes Intravenous Every 24 hours 12/11/11 0353     12/11/11 1600   piperacillin-tazobactam (ZOSYN) IVPB 3.375 g  Status:  Discontinued        3.375 g 12.5 mL/hr over 240 Minutes Intravenous Every 8 hours 12/11/11 0759 12/13/11 1533   12/11/11 1545   vancomycin (VANCOCIN) 2,500 mg in sodium chloride 0.9 % 500 mL IVPB        2,500 mg 250 mL/hr over 120 Minutes Intravenous NOW 12/11/11 1544 12/11/11 1948   12/11/11 0800   vancomycin (VANCOCIN) 2,500 mg in sodium chloride 0.9 % 500 mL IVPB  Status:  Discontinued        2,500 mg 250 mL/hr over 120 Minutes Intravenous  Once 12/11/11 0759 12/11/11 1543   12/11/11 0800  piperacillin-tazobactam (ZOSYN) IVPB 3.375 g       3.375 g 100 mL/hr over 30 Minutes Intravenous  Once 12/11/11 0759 12/11/11 1027   12/11/11 0145   Levofloxacin (LEVAQUIN) IVPB 750 mg        750 mg 100 mL/hr over 90 Minutes Intravenous To [facility] 12/11/11 0138 12/11/11 0435          Assessment: 45yo female with HCAP, sarcoidosis on day #5 of Levofloxacin.  There are no cultures.  Pt is  afebrile, renal function stable.  WBC wnl.  Goal of Therapy:  Resolution of pneumonia  Plan:  1.  Will change to Levofloxacin 750mg  po q24 (per P&T cmte), as pt has been afeb, is eating, and taking other po medications 2.  Please define planned length of treatment  Marisue Humble, PharmD Clinical Pharmacist [facility]

## 2011-12-16 DIAGNOSIS — E1165 Type 2 diabetes mellitus with hyperglycemia: Secondary | ICD-10-CM | POA: Diagnosis present

## 2011-12-16 LAB — GLUCOSE, CAPILLARY
Glucose-Capillary: 205 mg/dL — ABNORMAL HIGH (ref 70–99)
Glucose-Capillary: 148 mg/dL — ABNORMAL HIGH (ref 70–99)

## 2011-12-16 LAB — CBC
Hemoglobin: 13.4 g/dL (ref 12.0–15.0)
Platelets: 161 10*3/uL (ref 150–400)
RBC: 4.97 MIL/uL (ref 3.87–5.11)
WBC: 6 10*3/uL (ref 4.0–10.5)

## 2011-12-16 LAB — DIFFERENTIAL
Basophils Absolute: 0 K/uL (ref 0.0–0.1)
Basophils Relative: 1 % (ref 0–1)
Eosinophils Absolute: 0.3 K/uL (ref 0.0–0.7)
Eosinophils Relative: 4 % (ref 0–5)
Lymphocytes Relative: 18 % (ref 12–46)
Lymphs Abs: 1.1 K/uL (ref 0.7–4.0)
Monocytes Absolute: 0.4 K/uL (ref 0.1–1.0)
Monocytes Relative: 6 % (ref 3–12)
Neutro Abs: 4.2 K/uL (ref 1.7–7.7)
Neutrophils Relative %: 71 % (ref 43–77)

## 2011-12-16 LAB — BASIC METABOLIC PANEL WITH GFR
BUN: 12 mg/dL (ref 6–23)
CO2: 26 meq/L (ref 19–32)
Calcium: 10.2 mg/dL (ref 8.4–10.5)
Chloride: 95 meq/L — ABNORMAL LOW (ref 96–112)
Creatinine, Ser: 0.74 mg/dL (ref 0.50–1.10)
GFR calc Af Amer: >90 mL/min
GFR calc non Af Amer: >90 mL/min
Glucose, Bld: 197 mg/dL — ABNORMAL HIGH (ref 70–99)
Potassium: 4.2 meq/L (ref 3.5–5.1)
Sodium: 133 meq/L — ABNORMAL LOW (ref 135–145)

## 2011-12-16 NOTE — Progress Notes (Signed)
Subjective: Pt continues to complain of polydipsia. The patient's hemoglobin A1c noted at 7.5. Objective: Filed Vitals:   12/15/11 1956 12/15/11 2120 12/16/11 0625 12/16/11 0830  BP:  134/88 130/89   Pulse:  101 97   Temp:  98 F (36.7 C) 98.1 F (36.7 C)   TempSrc:  Oral Oral   Resp:  18 18   Height:      Weight:      SpO2: 95% 97% 97% 95%   Weight change:   Intake/Output Summary (Last 24 hours) at 12/16/11 0901 Last data filed at 12/15/11 1827  Gross per 24 hour  Intake    480 ml  Output   3301 ml  Net  -2821 ml    General: Alert, awake, oriented x3, in no acute distress.  HEENT: [place]/AT [place], EOMI Neck: Trachea midline,  no masses, no thyromegal,y no JVD, no carotid bruit OROPHARYNX:  Moist, No exudate/ erythema/lesions.  Heart: Regular rate and rhythm, without murmurs, rubs, gallops, PMI non-displaced, no heaves or thrills on palpation.  Lungs: Bibasilar crackles. No increased vocal fremitus resonant to percussion  Abdomen: Obese, soft, nontender, nondistended, positive bowel sounds, no masses no hepatosplenomegaly noted..  Neuro: No focal neurological deficits noted cranial nerves II through XII grossly intact. DTRs 2+ bilaterally upper and lower extremities. Strength functional in bilateral upper and lower extremities. Musculoskeletal: No warm swelling or erythema around joints, no spinal tenderness noted.      Lab Results:  Basename 12/16/11 0500 12/15/11 0545  NA 133* 134*  K 4.2 3.8  CL 95* 94*  CO2 26 28  GLUCOSE 197* 170*  BUN 12 12  CREATININE 0.74 1.01  CALCIUM 10.2 9.4  MG -- --  PHOS -- --   No results found for this basename: AST:2,ALT:2,ALKPHOS:2,BILITOT:2,PROT:2,ALBUMIN:2 in the last 72 hours No results found for this basename: LIPASE:2,AMYLASE:2 in the last 72 hours  Basename 12/16/11 0500 12/14/11 0654  WBC 6.0 5.9  NEUTROABS 4.2 --  HGB 13.4 12.5  HCT 40.3 37.6  MCV 81.1 81.6  PLT 161 145*   No results found for this basename:  CKTOTAL:3,CKMB:3,CKMBINDEX:3,TROPONINI:3 in the last 72 hours No components found with this basename: POCBNP:3 No results found for this basename: DDIMER:2 in the last 72 hours  Basename 12/14/11 0654  HGBA1C 7.5*   No results found for this basename: CHOL:2,HDL:2,LDLCALC:2,TRIG:2,CHOLHDL:2,LDLDIRECT:2 in the last 72 hours No results found for this basename: TSH,T4TOTAL,FREET3,T3FREE,THYROIDAB in the last 72 hours No results found for this basename: VITAMINB12:2,FOLATE:2,FERRITIN:2,TIBC:2,IRON:2,RETICCTPCT:2 in the last 72 hours  Micro Results: Recent Results (from the past 240 hour(s))  MRSA PCR SCREENING     Status: Normal   Collection Time   12/11/11  4:11 AM      Component Value Range Status Comment   MRSA by PCR NEGATIVE  NEGATIVE  Final     Studies/Results: Dg Chest 2 View  12/11/2011  *RADIOLOGY REPORT*  Clinical Data: Shortness of breath  CHEST - 2 VIEW  Comparison: 11/17/2011  Findings: Heart size is normal.  There is no pleural effusion or edema.  Chronic interstitial coarsening and bibasilar scarring is noted.  Superimposed airspace opacity is identified within the left base which may represent an acute infiltrate.  The visualized osseous structures are unremarkable.  IMPRESSION:  1.  Advanced interstitial coarsening and bibasilar scarring 2.  Left base opacity suspicious for superimposed infiltrate.  Original Report Authenticated By: Rosealee Albee, M.D.   Dg Chest 2 View  11/17/2011  *RADIOLOGY REPORT*  Clinical Data: Cough  and shortness of breath.  CHEST - 2 VIEW  Comparison: 10/25/2011.  Findings: The cardiac silhouette remains borderline enlarged.  No significant change in prominence of the interstitial markings with linear densities in both lower lung zones.  Stable mild diffuse peribronchial thickening.  Unremarkable bones.  IMPRESSION: Stable borderline cardiomegaly and chronic interstitial lung disease with bibasilar scarring.  Original Report Authenticated By: Darrol Angel, M.D.    Medications: I have reviewed the patient's current medications. Scheduled Meds:    . albuterol  2.5 mg Nebulization TID  . ALPRAZolam  1 mg Oral BID  . amLODipine  5 mg Oral Daily  . aspirin EC  81 mg Oral Daily  . budesonide-formoterol  2 puff Inhalation BID  . enoxaparin (LOVENOX) injection  70 mg Subcutaneous Q24H  . fluticasone  2 spray Each Nare Daily  . furosemide  200 mg Intravenous Q12H  . insulin aspart  0-20 Units Subcutaneous TID WC  . insulin aspart  0-5 Units Subcutaneous QHS  . insulin glargine  10 Units Subcutaneous QHS  . ipratropium  0.5 mg Nebulization TID  . levofloxacin  750 mg Oral Q24H  . loratadine  10 mg Oral Daily  . oxyCODONE  10 mg Oral BID  . pantoprazole  40 mg Oral Daily  . potassium chloride  40 mEq Oral BID  . sertraline  25 mg Oral Daily  . sodium chloride  3 mL Intravenous Q12H  . sodium chloride  3 mL Intravenous Q12H  . spironolactone  25 mg Oral Daily  . DISCONTD: insulin aspart  0-9 Units Subcutaneous TID WC  . DISCONTD: levofloxacin (LEVAQUIN) IV  750 mg Intravenous Q24H   Continuous Infusions:  PRN Meds:.acetaminophen, acetaminophen, albuterol, ondansetron (ZOFRAN) IV, ondansetron, promethazine Assessment/Plan: Patient Active Hospital Problem List: Diabetes II (12/15/2011)   Assessment:Pt with Hb A1c of 7.5 constituting a new diagnosis of Diabetes 2. Pt was started last night on Lantus and novolog.   Plan: Will increase tonights dose of lantus. Patient will need outpatient diabetic education and dietary consult prior to discharge.  HCAP (Healthcare acquired pneumonia) (12/11/2011)   Assessment: Pt has a h/o of pulmonary sarcoid. Im light of the suspected pneumonia, pt was started on broad spectrum antibiotics. Pt has improved clinically  on 3 days of broad spectrum Abx, and is now on day #6/8 of Levaquin.  COPD (04/26/2010)   Assessment: This appears to be quiescent. Will continue current regimen.    OBSTRUCTIVE SLEEP  APNEA (12/07/2010)   Assessment: continue CPAP   CHF (congestive heart failure) ()   Assessment: Pt has acute on chronic diastolic HF with an increase in weight gain.    Plan: Continue IV Lasix and reevaluate tomorrow.  Hypokalemia (12/11/2011)   Assessment: Repleted  Disposition: Anticipate discharge within the next 24 hours      LOS: 6 days

## 2011-12-17 LAB — GLUCOSE, CAPILLARY: Glucose-Capillary: 146 mg/dL — ABNORMAL HIGH (ref 70–99)

## 2011-12-17 MED ORDER — GLIMEPIRIDE 1 MG PO TABS: 1.0000 mg | ORAL_TABLET | Freq: Every day | ORAL | Status: DC

## 2011-12-17 MED ORDER — LIVING WELL WITH DIABETES BOOK
Freq: Once | Status: AC
  Administered 2011-12-17: 15:00:00
  Filled 2011-12-17: qty 1

## 2011-12-17 MED ORDER — INSULIN GLARGINE 100 UNIT/ML ~~LOC~~ SOLN: 10.0000 [IU] | Freq: Every day | SUBCUTANEOUS | Status: DC

## 2011-12-17 MED ORDER — ASPIRIN 81 MG PO TBEC: 81.0000 mg | DELAYED_RELEASE_TABLET | Freq: Every day | ORAL | Status: DC

## 2011-12-17 MED ORDER — "BD GETTING STARTED TAKE HOME KIT: 1ML X 30 G SYRINGES, "
1.0000 | Freq: Once | Status: AC
  Administered 2011-12-17: 1
  Filled 2011-12-17: qty 1

## 2011-12-17 MED ORDER — METFORMIN HCL 500 MG PO TABS
500.0000 mg | ORAL_TABLET | Freq: Two times a day (BID) | ORAL | Status: DC
  Administered 2011-12-17 (×2): 500 mg via ORAL
  Filled 2011-12-17 (×4): qty 1

## 2011-12-17 MED ORDER — METFORMIN HCL 500 MG PO TABS: 500.0000 mg | ORAL_TABLET | Freq: Two times a day (BID) | ORAL | Status: DC

## 2011-12-17 NOTE — Discharge Summary (Signed)
[name] MRN: [id number] DOB/AGE: November 11, 1965 46 y.o.  Admit date: 12/10/2011 Discharge date: 12/17/2011  Primary Care Physician:  Rene Paci, MD, MD   Discharge Diagnoses:   Patient Active Problem List  Diagnoses  . PULMONARY SARCOIDOSIS  . HYPERLIPIDEMIA  . MORBID OBESITY  . ANEMIA  . ANXIETY  . HYPERTENSION  . Chronic diastolic heart failure  . ALLERGIC RHINITIS  . COPD  . CHRONIC RESPIRATORY FAILURE  . GERD  . AMENORRHEA  . FIBROMYALGIA  . HEADACHE, CHRONIC  . OBSTRUCTIVE SLEEP APNEA  . Sciatica of right side  . CHF (congestive heart failure)  . Ejection fraction  . Obesity hypoventilation syndrome  . COPD exacerbation  . CAP (community acquired pneumonia)  . Hypokalemia  . Diarrhea  . Diabetes type 2, uncontrolled    DISCHARGE MEDICATION: Medication List  As of 12/17/2011 12:47 PM   TAKE these medications         albuterol (2.5 MG/3ML) 0.083% nebulizer solution   Commonly known as: PROVENTIL   Take 2.5 mg by nebulization every 6 (six) hours as needed. For wheeze or shortness of breath      albuterol 108 (90 BASE) MCG/ACT inhaler   Commonly known as: PROVENTIL HFA;VENTOLIN HFA   Inhale 2 puffs into the lungs every 6 (six) hours as needed. For wheeze or shortness of breath      ALPRAZolam 1 MG tablet   Commonly known as: XANAX   Take 1 mg by mouth 2 (two) times daily.      amLODipine 5 MG tablet   Commonly known as: NORVASC   TAKE 1 TABLET BY MOUTH EVERY DAY      aspirin 81 MG EC tablet   Take 1 tablet (81 mg total) by mouth daily.      budesonide-formoterol 160-4.5 MCG/ACT inhaler   Commonly known as: SYMBICORT   Inhale 2 puffs into the lungs 2 (two) times daily.      cetirizine 10 MG tablet   Commonly known as: ZYRTEC   Take 1 tablet (10 mg total) by mouth daily.      fluticasone 50 MCG/ACT nasal spray   Commonly known as: FLONASE   Place 2 sprays into the nose every morning.      glimepiride 1 MG tablet   Commonly known as: AMARYL    Take 1 tablet (1 mg total) by mouth daily before breakfast.      insulin glargine 100 UNIT/ML injection   Commonly known as: LANTUS   Inject 10 Units into the skin at bedtime.      metFORMIN 500 MG tablet   Commonly known as: GLUCOPHAGE   Take 1 tablet (500 mg total) by mouth 2 (two) times daily with a meal.      NEXIUM 40 MG capsule   Generic drug: esomeprazole   TAKE ONE CAPSULE BY MOUTH DAILY      oxyCODONE 10 MG 12 hr tablet   Commonly known as: OXYCONTIN   Take 5 mg by mouth 2 (two) times daily.      potassium chloride SA 20 MEQ tablet   Commonly known as: K-DUR,KLOR-CON   Take 20 mEq by mouth 2 (two) times daily.      promethazine 25 MG tablet   Commonly known as: PHENERGAN   Take 1 tablet (25 mg total) by mouth every 6 (six) hours as needed for nausea.      sertraline 25 MG tablet   Commonly known as: ZOLOFT   TAKE 1 TABLET BY  MOUTH DAILY      spironolactone 25 MG tablet   Commonly known as: ALDACTONE   Take 1 by mouth daily      tiotropium 18 MCG inhalation capsule   Commonly known as: SPIRIVA   Place 1 capsule (18 mcg total) into inhaler and inhale daily.      torsemide 100 MG tablet   Commonly known as: DEMADEX   Take 1 tablet (100 mg total) by mouth 2 (two) times daily.              Consults:     SIGNIFICANT DIAGNOSTIC STUDIES:  Dg Chest 2 View  12/11/2011  *RADIOLOGY REPORT*  Clinical Data: Shortness of breath  CHEST - 2 VIEW  Comparison: 11/17/2011  Findings: Heart size is normal.  There is no pleural effusion or edema.  Chronic interstitial coarsening and bibasilar scarring is noted.  Superimposed airspace opacity is identified within the left base which may represent an acute infiltrate.  The visualized osseous structures are unremarkable.  IMPRESSION:  1.  Advanced interstitial coarsening and bibasilar scarring 2.  Left base opacity suspicious for superimposed infiltrate.  Original Report Authenticated By: Rosealee Albee, M.D.        Recent Results (from the past 240 hour(s))  MRSA PCR SCREENING     Status: Normal   Collection Time   12/11/11  4:11 AM      Component Value Range Status Comment   MRSA by PCR NEGATIVE  NEGATIVE  Final     BRIEF ADMITTING H & P: 46 year-old female with history of COPD on home oxygen, OSA on CPAP, hypertension, diastolic CHF with normal EF measured last year presents to the ER because of increasing weakness over the last 3-4 days with cough and productive sputum. Patient states that she's been having off-and-on diarrhea for last one week and has one or 2 episodes everyday. Patient has increasing cough with productive sputum over the last 4 days. And when she coughs she gets some chest discomfort. Patient also being weak which increases on exertion. Denies any nausea vomiting abdominal pain fever chills. Because of the weakness he also gets dizzy though she did not lose consciousness and did not have any focal deficits. In the ER patient had a chest x-ray shows left lower lobe infiltrates concerning for pneumonia and patient also was found to be hypokalemic. Patient will be admitted for further workup.    Hospital Course:  Present on Admission:  HCAP (Healthcare acquired pneumonia) (12/11/2011) Assessment: Pt has a h/o of pulmonary sarcoid was assessed to have HCAP pneumonia. She was started on broad spectrum antibiotics. Pt has improved clinically on 3 days of broad spectrum Abx, and as clinical course improved, she was tapered to Levaquin. Pt has completed a total of 7 days of antibiotics.  Diabetes II (12/15/2011)  Assessment:Pt with Hb A1c of 7.5 constituting a new diagnosis of Diabetes 2. Pt was started last night on Lantus and novolog.  She had metformin 500 mg and Amaryl 2 mg added to her regimen. Pt should follow up with PMD for further titration of medications.  Pt was instructed in administration of Insulin and demonstrated competence.Patient will need outpatient diabetic  education. Patient has been given a prescription for glucose meter and supplies. I have also ordered for home health nursing for disease management.  CHF (congestive heart failure) () Assessment: Pt has acute on chronic diastolic HF with an increase in weight gain. Pt was treated with IV Torsemide and had good  diuresis. Pt however, has only a mod negative fluid balance as she was non-compliant with fluid restrictions.  Sarcoidosis   Assessment: Pt has a history of sarcoidosis and is oxygen dependent. Pt had Oxygen saturations of 97% on RA at times. I discussed this with her Pulmonologist Dr. Shelle Iron who advised that patient should use Oxygen around the clock as her variability with saturations as documented is such that pt would require Oxygen use. Pt is to continue Symbicort, Nebulizers  Hypokalemia (12/11/2011) Assessment: Repleted orally.  Obstructive sleep Apnea   Assessment: Pt had variable compliance with use of CPAP during this hospitalization  Diarrhea   Assessment: Pt had diarrhea early in her hospital course. C.diff and stool  Cultures were negative and patient was treated with Imodium.   Disposition and Follow-up:   Pt to follow up with PMD in 3 days.  F/U with Dr. Shelle Iron in 3 weeks as scheduled Discharge Orders    Future Appointments: Provider: Department: Dept Phone: Center:   01/10/2012 1:45 PM Barbaraann Share, MD [facility] [phone] None      DISCHARGE EXAM:  General: Alert, awake, oriented x3, in no acute distress.  Vital Signs: Blood pressure 148/88, pulse 108, temperature 97.7 F (36.5 C), temperature source Oral, resp. rate 20, height 5\' 9"  (1.753 m), weight 140.298 kg (309 lb 4.8 oz), last menstrual period 01/28/2011, SpO2 95.00%. HEENT: [place]/AT PEERL, EOMI  Neck: Trachea midline, no masses, no thyromegal,y no JVD, no carotid bruit  OROPHARYNX: Moist, No exudate/ erythema/lesions.  Heart: Regular rate and rhythm, without murmurs, rubs, gallops, PMI  non-displaced, no heaves or thrills on palpation.  Lungs: Bibasilar crackles. No increased vocal fremitus resonant to percussion  Abdomen: Obese, soft, nontender, nondistended, positive bowel sounds, no masses no hepatosplenomegaly noted..  Neuro: No focal neurological deficits noted cranial nerves II through XII grossly intact. DTRs 2+ bilaterally upper and lower extremities. Strength functional in bilateral upper and lower extremities.  Musculoskeletal: No warm swelling or erythema around joints, no spinal tenderness noted.    Basename 12/16/11 0500 12/15/11 0545  NA 133* 134*  K 4.2 3.8  CL 95* 94*  CO2 26 28  GLUCOSE 197* 170*  BUN 12 12  CREATININE 0.74 1.01  CALCIUM 10.2 9.4  MG -- --  PHOS -- --   No results found for this basename: AST:2,ALT:2,ALKPHOS:2,BILITOT:2,PROT:2,ALBUMIN:2 in the last 72 hours No results found for this basename: LIPASE:2,AMYLASE:2 in the last 72 hours  Basename 12/16/11 0500  WBC 6.0  NEUTROABS 4.2  HGB 13.4  HCT 40.3  MCV 81.1  PLT 161   Total Time for D/C process including face to face time approximately 55 minutes Signed: Eulon Allnutt A. 12/17/2011, 12:47 PM

## 2011-12-17 NOTE — Progress Notes (Signed)
Case manager on call called and confirmed set up for diabetic educator RN through [facility] Pt education on diabetes done. Pt watched all videos, Diabetic Handouts and booklets given and explained in details. Pt self administered insulin after verbalizing blood sugar check x 2. Walked pt through self administering insulin. Teaching reinforced by Arville Go. Supplies given to the pt. Will continue to monitor.

## 2011-12-17 NOTE — Progress Notes (Signed)
Pt stated she did not want to put on CPAP at this time due to pain. Pt encouraged to call RT if pt changed mind throughout the night.  No distress noted.

## 2011-12-18 NOTE — Progress Notes (Signed)
WEEKEND ON CALL CASE MANAGER NOTE:  12/18/11 Onnie Boer, RN, BSN 1100 PT WAS DC'D ON 3/24 AND NEEDS HH RN.  PT HAS CHOSEN [place].  HH ARRANGED.

## 2011-12-19 ENCOUNTER — Ambulatory Visit: Payer: Medicare Other | Admitting: Internal Medicine

## 2011-12-20 ENCOUNTER — Ambulatory Visit: Payer: Medicare Other | Admitting: Internal Medicine

## 2011-12-20 DIAGNOSIS — Z0289 Encounter for other administrative examinations: Secondary | ICD-10-CM

## 2011-12-21 ENCOUNTER — Emergency Department (HOSPITAL_COMMUNITY)
Admission: EM | Admit: 2011-12-21 | Discharge: 2011-12-21 | Discharge status: Against Medical Advice | Payer: Medicare Other | Attending: Emergency Medicine | Admitting: Emergency Medicine

## 2011-12-21 ENCOUNTER — Encounter (HOSPITAL_COMMUNITY): Payer: Self-pay | Admitting: *Deleted

## 2011-12-21 DIAGNOSIS — R197 Diarrhea, unspecified: Secondary | ICD-10-CM | POA: Insufficient documentation

## 2011-12-21 DIAGNOSIS — R21 Rash and other nonspecific skin eruption: Secondary | ICD-10-CM | POA: Insufficient documentation

## 2011-12-21 DIAGNOSIS — R109 Unspecified abdominal pain: Secondary | ICD-10-CM | POA: Insufficient documentation

## 2011-12-21 DIAGNOSIS — R110 Nausea: Secondary | ICD-10-CM | POA: Insufficient documentation

## 2011-12-21 NOTE — ED Notes (Signed)
Pt reports 3 day hx of diarrhea. Reports lower abd cramping. Pt reports mild nausea today. Denies vomiting. Reports was admitted and discharged on Sunday. Pt reports vaginal rash also. Denies fevers. Pt is on home O2 2-3L.

## 2011-12-21 NOTE — ED Notes (Signed)
Pt left ER AMA. Asked pt to sign AMA form and refused. Pt stated that she was going to [facility].

## 2011-12-22 ENCOUNTER — Encounter (HOSPITAL_COMMUNITY): Payer: Self-pay | Admitting: *Deleted

## 2011-12-22 ENCOUNTER — Emergency Department (HOSPITAL_COMMUNITY): Payer: Medicare Other

## 2011-12-22 ENCOUNTER — Observation Stay (HOSPITAL_COMMUNITY)
Admission: EM | Admit: 2011-12-22 | Discharge: 2011-12-23 | Disposition: A | Discharge status: Home / Self Care | Payer: Medicare Other | Attending: Internal Medicine | Admitting: Internal Medicine

## 2011-12-22 DIAGNOSIS — J9610 Chronic respiratory failure, unspecified whether with hypoxia or hypercapnia: Secondary | ICD-10-CM

## 2011-12-22 DIAGNOSIS — J99 Respiratory disorders in diseases classified elsewhere: Secondary | ICD-10-CM | POA: Insufficient documentation

## 2011-12-22 DIAGNOSIS — Z794 Long term (current) use of insulin: Secondary | ICD-10-CM | POA: Insufficient documentation

## 2011-12-22 DIAGNOSIS — R9430 Abnormal result of cardiovascular function study, unspecified: Secondary | ICD-10-CM

## 2011-12-22 DIAGNOSIS — N912 Amenorrhea, unspecified: Secondary | ICD-10-CM

## 2011-12-22 DIAGNOSIS — J449 Chronic obstructive pulmonary disease, unspecified: Secondary | ICD-10-CM

## 2011-12-22 DIAGNOSIS — IMO0001 Reserved for inherently not codable concepts without codable children: Secondary | ICD-10-CM

## 2011-12-22 DIAGNOSIS — E785 Hyperlipidemia, unspecified: Secondary | ICD-10-CM

## 2011-12-22 DIAGNOSIS — M797 Fibromyalgia: Secondary | ICD-10-CM | POA: Diagnosis present

## 2011-12-22 DIAGNOSIS — I5033 Acute on chronic diastolic (congestive) heart failure: Secondary | ICD-10-CM | POA: Diagnosis present

## 2011-12-22 DIAGNOSIS — J309 Allergic rhinitis, unspecified: Secondary | ICD-10-CM

## 2011-12-22 DIAGNOSIS — R197 Diarrhea, unspecified: Principal | ICD-10-CM | POA: Insufficient documentation

## 2011-12-22 DIAGNOSIS — I5032 Chronic diastolic (congestive) heart failure: Secondary | ICD-10-CM

## 2011-12-22 DIAGNOSIS — R51 Headache: Secondary | ICD-10-CM

## 2011-12-22 DIAGNOSIS — D649 Anemia, unspecified: Secondary | ICD-10-CM

## 2011-12-22 DIAGNOSIS — M5431 Sciatica, right side: Secondary | ICD-10-CM

## 2011-12-22 DIAGNOSIS — I10 Essential (primary) hypertension: Secondary | ICD-10-CM

## 2011-12-22 DIAGNOSIS — I509 Heart failure, unspecified: Secondary | ICD-10-CM

## 2011-12-22 DIAGNOSIS — J189 Pneumonia, unspecified organism: Secondary | ICD-10-CM

## 2011-12-22 DIAGNOSIS — E1165 Type 2 diabetes mellitus with hyperglycemia: Secondary | ICD-10-CM | POA: Diagnosis present

## 2011-12-22 DIAGNOSIS — K219 Gastro-esophageal reflux disease without esophagitis: Secondary | ICD-10-CM

## 2011-12-22 DIAGNOSIS — R5381 Other malaise: Secondary | ICD-10-CM | POA: Insufficient documentation

## 2011-12-22 DIAGNOSIS — J441 Chronic obstructive pulmonary disease with (acute) exacerbation: Secondary | ICD-10-CM | POA: Diagnosis present

## 2011-12-22 DIAGNOSIS — E860 Dehydration: Secondary | ICD-10-CM | POA: Insufficient documentation

## 2011-12-22 DIAGNOSIS — E662 Morbid (severe) obesity with alveolar hypoventilation: Secondary | ICD-10-CM

## 2011-12-22 DIAGNOSIS — G4733 Obstructive sleep apnea (adult) (pediatric): Secondary | ICD-10-CM | POA: Diagnosis present

## 2011-12-22 DIAGNOSIS — R531 Weakness: Secondary | ICD-10-CM

## 2011-12-22 DIAGNOSIS — D869 Sarcoidosis, unspecified: Secondary | ICD-10-CM | POA: Diagnosis present

## 2011-12-22 DIAGNOSIS — E876 Hypokalemia: Secondary | ICD-10-CM

## 2011-12-22 DIAGNOSIS — F411 Generalized anxiety disorder: Secondary | ICD-10-CM

## 2011-12-22 DIAGNOSIS — IMO0002 Reserved for concepts with insufficient information to code with codable children: Secondary | ICD-10-CM | POA: Diagnosis present

## 2011-12-22 LAB — CBC
MCH: 27.4 pg (ref 26.0–34.0)
MCHC: 34 g/dL (ref 30.0–36.0)
MCV: 80.7 fL (ref 78.0–100.0)
Platelets: 197 10*3/uL (ref 150–400)
Platelets: 180 10*3/uL (ref 150–400)
RBC: 5.2 MIL/uL — ABNORMAL HIGH (ref 3.87–5.11)
RDW: 18 % — ABNORMAL HIGH (ref 11.5–15.5)
WBC: 9.7 10*3/uL (ref 4.0–10.5)

## 2011-12-22 LAB — COMPREHENSIVE METABOLIC PANEL
BUN: 9 mg/dL (ref 6–23)
CO2: 26 mEq/L (ref 19–32)
Chloride: 95 mEq/L — ABNORMAL LOW (ref 96–112)
Creatinine, Ser: 0.79 mg/dL (ref 0.50–1.10)
GFR calc Af Amer: >90 mL/min (ref >90)
GFR calc non Af Amer: >90 mL/min (ref >90)
Glucose, Bld: 156 mg/dL — ABNORMAL HIGH (ref 70–99)
Total Bilirubin: 0.8 mg/dL (ref 0.3–1.2)

## 2011-12-22 LAB — GLUCOSE, CAPILLARY: Glucose-Capillary: 172 mg/dL — ABNORMAL HIGH (ref 70–99)

## 2011-12-22 LAB — CREATININE, SERUM: Creatinine, Ser: 0.82 mg/dL (ref 0.50–1.10)

## 2011-12-22 LAB — DIFFERENTIAL
Eosinophils Absolute: 0.3 10*3/uL (ref 0.0–0.7)
Lymphocytes Relative: 17 % (ref 12–46)
Lymphs Abs: 1.7 10*3/uL (ref 0.7–4.0)
Neutrophils Relative %: 72 % (ref 43–77)

## 2011-12-22 LAB — PRO B NATRIURETIC PEPTIDE: Pro B Natriuretic peptide (BNP): 30.2 pg/mL (ref 0–125)

## 2011-12-22 MED ORDER — GUAIFENESIN-DM 100-10 MG/5ML PO SYRP: 5.0000 mL | ORAL_SOLUTION | ORAL | Status: DC | PRN

## 2011-12-22 MED ORDER — PROMETHAZINE HCL 25 MG PO TABS: 25.0000 mg | ORAL_TABLET | Freq: Three times a day (TID) | ORAL | Status: DC | PRN

## 2011-12-22 MED ORDER — HYDROCODONE-ACETAMINOPHEN 5-325 MG PO TABS
1.0000 | ORAL_TABLET | ORAL | Status: DC | PRN
  Administered 2011-12-22: 2 via ORAL
  Filled 2011-12-22: qty 2

## 2011-12-22 MED ORDER — ONDANSETRON HCL 4 MG PO TABS: 4.0000 mg | ORAL_TABLET | Freq: Four times a day (QID) | ORAL | Status: DC | PRN

## 2011-12-22 MED ORDER — TIOTROPIUM BROMIDE MONOHYDRATE 18 MCG IN CAPS
18.0000 ug | ORAL_CAPSULE | Freq: Every day | RESPIRATORY_TRACT | Status: DC
  Administered 2011-12-23: 18 ug via RESPIRATORY_TRACT
  Filled 2011-12-22: qty 5

## 2011-12-22 MED ORDER — OXYCODONE HCL 10 MG PO TB12
10.0000 mg | ORAL_TABLET | Freq: Two times a day (BID) | ORAL | Status: DC
Start: 2011-12-22 — End: 2011-12-23
  Administered 2011-12-22 – 2011-12-23 (×2): 10 mg via ORAL
  Filled 2011-12-22 (×2): qty 1

## 2011-12-22 MED ORDER — INSULIN GLARGINE 100 UNIT/ML ~~LOC~~ SOLN
10.0000 [IU] | Freq: Every day | SUBCUTANEOUS | Status: DC
  Administered 2011-12-22: 10 [IU] via SUBCUTANEOUS

## 2011-12-22 MED ORDER — ASPIRIN 81 MG PO TBEC: 81.0000 mg | DELAYED_RELEASE_TABLET | Freq: Every day | ORAL | Status: DC

## 2011-12-22 MED ORDER — POTASSIUM CHLORIDE CRYS ER 20 MEQ PO TBCR
20.0000 meq | EXTENDED_RELEASE_TABLET | Freq: Two times a day (BID) | ORAL | Status: DC
  Administered 2011-12-23: 20 meq via ORAL
  Filled 2011-12-22 (×2): qty 1

## 2011-12-22 MED ORDER — PANTOPRAZOLE SODIUM 40 MG PO TBEC
40.0000 mg | DELAYED_RELEASE_TABLET | Freq: Every day | ORAL | Status: DC
Start: 2011-12-22 — End: 2011-12-23
  Administered 2011-12-23: 40 mg via ORAL
  Filled 2011-12-22: qty 1

## 2011-12-22 MED ORDER — SODIUM CHLORIDE 0.9 % IV SOLN
INTRAVENOUS | Status: AC
  Administered 2011-12-23: 03:00:00 via INTRAVENOUS

## 2011-12-22 MED ORDER — ONDANSETRON HCL 4 MG/2ML IJ SOLN: 4.0000 mg | Freq: Four times a day (QID) | INTRAMUSCULAR | Status: DC | PRN

## 2011-12-22 MED ORDER — ALBUTEROL SULFATE (5 MG/ML) 0.5% IN NEBU
2.5000 mg | INHALATION_SOLUTION | Freq: Four times a day (QID) | RESPIRATORY_TRACT | Status: DC
  Administered 2011-12-22 – 2011-12-23 (×2): 2.5 mg via RESPIRATORY_TRACT
  Filled 2011-12-22 (×2): qty 0.5

## 2011-12-22 MED ORDER — SERTRALINE HCL 25 MG PO TABS
25.0000 mg | ORAL_TABLET | Freq: Every day | ORAL | Status: DC
  Administered 2011-12-23: 25 mg via ORAL
  Filled 2011-12-22 (×2): qty 1

## 2011-12-22 MED ORDER — HEPARIN SODIUM (PORCINE) 5000 UNIT/ML IJ SOLN
5000.0000 [IU] | Freq: Three times a day (TID) | INTRAMUSCULAR | Status: DC
  Administered 2011-12-22 – 2011-12-23 (×3): 5000 [IU] via SUBCUTANEOUS
  Filled 2011-12-22 (×5): qty 1

## 2011-12-22 MED ORDER — BUDESONIDE-FORMOTEROL FUMARATE 160-4.5 MCG/ACT IN AERO
2.0000 | INHALATION_SPRAY | Freq: Two times a day (BID) | RESPIRATORY_TRACT | Status: DC
  Administered 2011-12-22 – 2011-12-23 (×2): 2 via RESPIRATORY_TRACT
  Filled 2011-12-22: qty 6

## 2011-12-22 MED ORDER — ALPRAZOLAM 0.5 MG PO TABS
1.0000 mg | ORAL_TABLET | Freq: Two times a day (BID) | ORAL | Status: DC | PRN
  Administered 2011-12-23: 1 mg via ORAL
  Filled 2011-12-22: qty 2

## 2011-12-22 MED ORDER — SODIUM CHLORIDE 0.9 % IV BOLUS (SEPSIS): 1000.0000 mL | Freq: Once | INTRAVENOUS | Status: DC

## 2011-12-22 MED ORDER — ONDANSETRON HCL 4 MG/2ML IJ SOLN
4.0000 mg | Freq: Once | INTRAMUSCULAR | Status: AC
  Administered 2011-12-22: 4 mg via INTRAVENOUS
  Filled 2011-12-22: qty 2

## 2011-12-22 MED ORDER — ASPIRIN EC 81 MG PO TBEC
81.0000 mg | DELAYED_RELEASE_TABLET | Freq: Every day | ORAL | Status: DC
  Administered 2011-12-23: 81 mg via ORAL
  Filled 2011-12-22 (×2): qty 1

## 2011-12-22 MED ORDER — SODIUM CHLORIDE 0.9 % IV SOLN
INTRAVENOUS | Status: DC
  Administered 2011-12-22: 17:00:00 via INTRAVENOUS

## 2011-12-22 MED ORDER — LORATADINE 10 MG PO TABS
10.0000 mg | ORAL_TABLET | Freq: Every day | ORAL | Status: DC
  Administered 2011-12-23: 10 mg via ORAL
  Filled 2011-12-22 (×2): qty 1

## 2011-12-22 MED ORDER — TORSEMIDE 100 MG PO TABS
100.0000 mg | ORAL_TABLET | Freq: Two times a day (BID) | ORAL | Status: DC
  Administered 2011-12-22 – 2011-12-23 (×2): 100 mg via ORAL
  Filled 2011-12-22 (×4): qty 1

## 2011-12-22 MED ORDER — INSULIN ASPART 100 UNIT/ML ~~LOC~~ SOLN
0.0000 [IU] | Freq: Three times a day (TID) | SUBCUTANEOUS | Status: DC
Start: 2011-12-23 — End: 2011-12-23
  Administered 2011-12-23 (×2): 1 [IU] via SUBCUTANEOUS

## 2011-12-22 MED ORDER — SODIUM CHLORIDE 0.9 % IJ SOLN
3.0000 mL | Freq: Two times a day (BID) | INTRAMUSCULAR | Status: DC
  Administered 2011-12-22: 3 mL via INTRAVENOUS

## 2011-12-22 MED ORDER — ALBUTEROL SULFATE (5 MG/ML) 0.5% IN NEBU: 2.5000 mg | INHALATION_SOLUTION | RESPIRATORY_TRACT | Status: DC | PRN

## 2011-12-22 MED ORDER — AMLODIPINE BESYLATE 5 MG PO TABS
5.0000 mg | ORAL_TABLET | Freq: Every day | ORAL | Status: DC
  Administered 2011-12-23: 5 mg via ORAL
  Filled 2011-12-22 (×2): qty 1

## 2011-12-22 MED ORDER — SPIRONOLACTONE 25 MG PO TABS
25.0000 mg | ORAL_TABLET | Freq: Every day | ORAL | Status: DC
  Administered 2011-12-23: 25 mg via ORAL
  Filled 2011-12-22: qty 1

## 2011-12-22 NOTE — ED Notes (Signed)
Pt. Taken to 4700 on portable monitor with RN

## 2011-12-22 NOTE — ED Notes (Signed)
Admitting md states that diarrhea is due to metformin

## 2011-12-22 NOTE — ED Notes (Signed)
Patient reports she has had diarrhea for 4 days.  She was just d/c from hospital for pneumonia.  Patient stopped abt on Sunday.  She reports she is feeling weak and dizzy.  Her md is concerned due to her having diabetes

## 2011-12-22 NOTE — Progress Notes (Signed)
Pt arrived to 4700 via stretcher from ED. Pt oriented to rm and hospital policies. VSS, pt complain of abd pain and tenderness.  NS infusing at 75 ml/hr.  Will carry out new orders and will con't to monitor.

## 2011-12-22 NOTE — Progress Notes (Signed)
RT Note : Pt setup with CPAP @ 9CMH2O with 2lpm O2 via full face mask(pt wears at home) for hs.

## 2011-12-22 NOTE — ED Notes (Signed)
[facility] 172 Rn notified [place]

## 2011-12-22 NOTE — H&P (Signed)
[name] [phone]  Outpatient Primary MD for the patient is Rene Paci, MD, MD  With History of -  Past Medical History  Diagnosis Date  . Sleep apnea     noncompliant w/ CPAP  . COPD (chronic obstructive pulmonary disease)     moderate airflow obstruction, suspect d/t emphysema  . Hypertension   . Hyperlipidemia   . Chronic headache   . Fibromyalgia     daily narcotics  . Anxiety     hx chronic BZ use, stopped 07/2010  . Anemia   . Pulmonary sarcoidosis     unimpressive CT chest 2011  . Colonic polyp   . GERD (gastroesophageal reflux disease)   . ALLERGIC RHINITIS   . Asthma   . CHF (congestive heart failure)     Diastolic with fluid overload, May, 2012, LVEF 60%  . Morbid obesity   . Pneumonia 12/11/11    "first time since 01/2010; I get it often"  . Heart murmur   . Angina   . Sarcoidosis of skin   . Emphysema   . Shortness of breath on exertion   . Chronic back pain greater than 3 months duration   . Depression   . PTSD (post-traumatic stress disorder)   . Panic attacks       Past Surgical History  Procedure Date  . Polypectomy 2011  . Lumbar microdiscectomy 07/06/2011    R L4-5, stern  . Back surgery     in for   Chief Complaint  Patient presents with  . Diarrhea  . Weakness  . Shortness of Breath  . Abdominal Pain  . Rash     HPI  [name]  is a 46 y.o. female, with history of pulmonary sarcoidosis for 2 L nasal cannula oxygen at home, was recently discharged from this hospital for pneumonia, during that admission she was also diagnosed with type 2 diabetes mellitus and placed on Glucophage along with Lantus, she had some exposure to antibiotics during her hospitalization but was not discharged on any antibiotics, she now presents to the hospital with chief complaints of crampy abdominal pain which is generalized and nonradiating ongoing for 2-3 days associated with watery diarrhea, patient denies any blood or  mucus in stool, denies any fever chills denies consuming any bad foods, no other contacts are sick with similar problem. In the ER she was found to be slightly dehydrated clinically with mild tachycardia and subjective dizziness I was called to admit the patient for dehydration.     Review of Systems    In addition to the HPI above,  No Fever-chills, No Headache, No changes with Vision or hearing, No problems swallowing food or Liquids, No Chest pain, Cough or Shortness of Breath, No Abdominal pain, No Nausea or Vommitting, + diarrhea and cramping with some ache No Blood in stool or Urine, No dysuria, No new skin rashes or bruises, No new joints pains-aches,  No new weakness, tingling, numbness in any extremity, No recent weight gain or loss, No polyuria, polydypsia or polyphagia, No significant Mental Stressors.  A full 10 point Review of Systems was done, except as stated above, all other Review of Systems were negative.   Social History History  Substance Use Topics  . Smoking status: Former Smoker -- 1.0 packs/day for 29 years    Types: Cigarettes    Quit date: 09/14/2010  . Smokeless tobacco: Never Used  . Alcohol Use: No      Family History Family History  Problem Relation Age of Onset  . Hypertension Mother   . Emphysema Father   . Hypertension Father   . Stomach cancer Father   . Allergies Brother   . Hypertension Brother   . Stomach cancer Brother   . Heart disease Mother   . Heart disease Father   . Heart disease Brother       Prior to Admission medications   Medication Sig Start Date End Date Taking? Authorizing Provider  albuterol (PROVENTIL HFA;VENTOLIN HFA) 108 (90 BASE) MCG/ACT inhaler Inhale 2 puffs into the lungs every 6 (six) hours as needed. For wheeze or shortness of breath 04/10/11  Yes Newt Lukes, MD  albuterol (PROVENTIL) (2.5 MG/3ML) 0.083% nebulizer solution Take 2.5 mg by nebulization every 6 (six) hours as needed. For wheeze or  shortness of breath 01/18/11  Yes Barbaraann Share, MD  ALPRAZolam Prudy Feeler) 1 MG tablet Take 1 mg by mouth 2 (two) times daily as needed. For anxiety   Yes Historical Provider, MD  amLODipine (NORVASC) 5 MG tablet TAKE 1 TABLET BY MOUTH EVERY DAY 09/29/11  Yes Newt Lukes, MD  aspirin 81 MG EC tablet Take 81 mg by mouth daily. 12/17/11 12/16/12 Yes Altha Harm, MD  budesonide-formoterol [facility]) 160-4.5 MCG/ACT inhaler Inhale 2 puffs into the lungs 2 (two) times daily.     Yes Historical Provider, MD  cetirizine (ZYRTEC) 10 MG tablet Take 10 mg by mouth at bedtime. 11/24/11 11/23/12 Yes Newt Lukes, MD  glimepiride (AMARYL) 1 MG tablet Take 1 mg by mouth daily before breakfast. 12/17/11 12/16/12 Yes Altha Harm, MD  insulin glargine (LANTUS) 100 UNIT/ML injection Inject 10 Units into the skin at bedtime. 12/17/11 12/16/12 Yes Altha Harm, MD  metFORMIN (GLUCOPHAGE) 500 MG tablet Take 500 mg by mouth 2 (two) times daily with a meal. 12/17/11 12/16/12 Yes Altha Harm, MD  NEXIUM 40 MG capsule TAKE ONE CAPSULE BY MOUTH DAILY 11/01/11  Yes Newt Lukes, MD  oxyCODONE (OXYCONTIN) 10 MG 12 hr tablet Take 10 mg by mouth 2 (two) times daily.    Yes Historical Provider, MD  potassium chloride SA (K-DUR,KLOR-CON) 20 MEQ tablet Take 20 mEq by mouth 2 (two) times daily.  05/26/11  Yes Newt Lukes, MD  promethazine (PHENERGAN) 25 MG tablet Take 25 mg by mouth every 6 (six) hours as needed. For nausea   Yes Historical Provider, MD  sertraline (ZOLOFT) 25 MG tablet Take 25 mg by mouth daily.  12/05/11  Yes Newt Lukes, MD  spironolactone (ALDACTONE) 25 MG tablet Take 1 by mouth daily 07/18/11  Yes Historical Provider, MD  tiotropium (SPIRIVA HANDIHALER) 18 MCG inhalation capsule Place 1 capsule (18 mcg total) into inhaler and inhale daily. 11/15/11  Yes Barbaraann Share, MD  torsemide (DEMADEX) 100 MG tablet Take 1 tablet (100 mg total) by mouth 2 (two) times daily.  10/27/11  Yes Barbaraann Share, MD    Allergies  Allergen Reactions  . Ciprofloxacin     REACTION: nausea  . Latex     REACTION: rash, burning  . Sulfamethoxazole W/Trimethoprim     Bactrim REACTION: Projectile Vomiting  . Azithromycin     REACTION: resistant  . Doxycycline     REACTION: restistant    Physical Exam  Vitals  Blood pressure 124/62, pulse 106, temperature 97.9 F (36.6 C), temperature source Oral, resp. rate 22, height 5\' 8"  (1.727 m), weight 136.079 kg (300 lb), last menstrual period 01/28/2011, SpO2  97.00%.   1. General middle aged obese AA Female lying in bed in NAD,     2. Normal affect and insight, Not Suicidal or Homicidal, Awake Alert, Oriented *3.  3. No F.N deficits, ALL C.Nerves Intact, Strength 5/5 all 4 extremities, Sensation intact all 4 extremities, Plantars down going.  4. Ears and Eyes appear Normal, Conjunctivae clear, PERRLA. Moist Oral Mucosa.  5. Supple Neck , No JVD, No cervical lymphadenopathy appriciated, No Carotid Bruits.  6. Symmetrical Chest wall movement, Good air movement bilaterally, CTAB.  7. RRR, No Gallops, Rubs or Murmurs, No Parasternal Heave.  8. Positive Bowel Sounds, Abdomen Soft, Non tender, No organomegaly appriciated, No rebound -guarding or rigidity.  9.  No Cyanosis, Normal Skin Turgor, No Skin Rash or Bruise.  10. Good muscle tone,  joints appear normal , no effusions, Normal ROM.  11. No Palpable Lymph Nodes in Neck or Axillae     Data Review  CBC  Lab 12/22/11 1413 12/16/11 0500  WBC 9.7 6.0  HGB 14.5 13.4  HCT 42.2 40.3  PLT 197 161  MCV 81.2 81.1  MCH 27.9 27.0  MCHC 34.4 33.3  RDW 18.0* 17.4*  LYMPHSABS 1.7 1.1  MONOABS 0.6 0.4  EOSABS 0.3 0.3  BASOSABS 0.1 0.0  BANDABS -- --   ------------------------------------------------------------------------------------------------------------------ Chemistries   Lab 12/22/11 1413 12/16/11 0500  NA 134* 133*  K 3.5 4.2  CL 95* 95*  CO2 26 26    GLUCOSE 156* 197*  BUN 9 12  CREATININE 0.79 0.74  CALCIUM 9.7 10.2  MG -- --  AST 38* --  ALT 41* --  ALKPHOS 132* --  BILITOT 0.8 --   ------------------------------------------------------------------------------------------------------------------ estimated creatinine clearance is 131.5 ml/min (by C-G formula based on Cr of 0.79). ------------------------------------------------------------------------------------------------------------------ No results found for this basename: TSH,T4TOTAL,FREET3,T3FREE,THYROIDAB in the last 72 hours  Coagulation profile No results found for this basename: INR:5,PROTIME:5 in the last 168 hours ------------------------------------------------------------------------------------------------------------------- No results found for this basename: DDIMER:2 in the last 72 hours ------------------------------------------------------------------------------------------------------------------- Cardiac Enzymes No results found for this basename: CK:3,CKMB:3,TROPONINI:3,MYOGLOBIN:3 in the last 168 hours ------------------------------------------------------------------------------------------------------------------ No components found with this basename: POCBNP:3 ------------------------------------------------------------------------------------------------------------------  Imaging results:   Dg Chest 2 View  12/22/2011  *RADIOLOGY REPORT*  Clinical Data: Shortness of breath on oxygen. Recent treatment for pneumonia. Cough and history of congestive heart failure. Emphysema and COPD  CHEST - 2 VIEW  Comparison: 12/10/2011 and 10/25/2011  Findings: Changes of underlying COPD are evident.  Taking this into consideration heart size is mildly enlarged and stable.  A stable mediastinal configuration with prominence of the pulmonary arteries is seen.  The lung fields demonstrate diffuse increase in interstitial markings most notable at both lung bases and  compatible with underlying chronic bronchitic change.  No change is noted in comparison with prior exam from 04/2011. No new focal infiltrates or signs of congestive failure are seen. No new interstitial septal lines are seen to suggest early interstitial edema.  No pleural fluid is noted  Bony structures appear intact.  IMPRESSION: Stable chronic changes with underlying COPD.  No new focal or acute abnormality identified.  No explanation for the patient's shortness of breath is seen.  Original Report Authenticated By: Bertha Stakes, M.D.   Dg Chest 2 View  12/11/2011  *RADIOLOGY REPORT*  Clinical Data: Shortness of breath  CHEST - 2 VIEW  Comparison: 11/17/2011  Findings: Heart size is normal.  There is no pleural effusion or edema.  Chronic interstitial coarsening and bibasilar scarring  is noted.  Superimposed airspace opacity is identified within the left base which may represent an acute infiltrate.  The visualized osseous structures are unremarkable.  IMPRESSION:  1.  Advanced interstitial coarsening and bibasilar scarring 2.  Left base opacity suspicious for superimposed infiltrate.  Original Report Authenticated By: Rosealee Albee, M.D.       Assessment & Plan   1. Dehydration, sinus tachycardia and mild hyponatremia  secondary to diarrhea most likely caused from Glucophage - since she had been hospitalized recently and had some exposure to Levaquin we'll rule out C. Difficile, patient will give gentle IV fluids for rehydration, will hold her diuretics for today, we'll discontinue her Glucophage and monitor.   2. Pulmonary sarcoidosis and COPD - appears to be at baseline 2 L nasal cannula oxygen no decompensation we'll continue the same. No wheezing on exam.   3. History of chronic diastolic CHF currently compensated will hold diuretics today due to #1 above, will resume diuretics tomorrow if patient is clinically stable, for now will require gentle hydration.    4. Diabetes  mellitus type 2 we'll check A1c patient will be on car modified diet, Glucophage will be held, since patient is having diarrhea I would also hold Amaryl i.e. if by mouth intake is labile, will place her on sliding scale insulin along with Lantus.   5. History of obstructive sleep apnea no acute issues continue nighttime use of CPAP patient will bring her home device.   DVT Prophylaxis Heparin   AM Labs Ordered, also please review Full Orders  Admission, patients condition and plan of care including tests being ordered have been discussed with the patient   who indicates understanding and agree with the plan and Code Status.  Code Status DNR  Condition Marinell Blight K M.D on 12/22/2011 at 4:40 PM  [facility] Office  [phone]

## 2011-12-23 LAB — BASIC METABOLIC PANEL
CO2: 28 mEq/L (ref 19–32)
Calcium: 8.6 mg/dL (ref 8.4–10.5)
Chloride: 96 mEq/L (ref 96–112)
Glucose, Bld: 107 mg/dL — ABNORMAL HIGH (ref 70–99)
Sodium: 136 mEq/L (ref 135–145)

## 2011-12-23 LAB — POTASSIUM: Potassium: 4 mEq/L (ref 3.5–5.1)

## 2011-12-23 LAB — CBC
HCT: 38.4 % (ref 36.0–46.0)
Hemoglobin: 12.6 g/dL (ref 12.0–15.0)
MCH: 27.3 pg (ref 26.0–34.0)
MCV: 83.1 fL (ref 78.0–100.0)
RBC: 4.62 MIL/uL (ref 3.87–5.11)
WBC: 7.2 10*3/uL (ref 4.0–10.5)

## 2011-12-23 LAB — GLUCOSE, CAPILLARY
Glucose-Capillary: 126 mg/dL — ABNORMAL HIGH (ref 70–99)
Glucose-Capillary: 148 mg/dL — ABNORMAL HIGH (ref 70–99)

## 2011-12-23 MED ORDER — INSULIN GLARGINE 100 UNIT/ML ~~LOC~~ SOLN: 14.0000 [IU] | Freq: Every day | SUBCUTANEOUS | Status: DC

## 2011-12-23 MED ORDER — POTASSIUM CHLORIDE 10 MEQ/100ML IV SOLN
10.0000 meq | INTRAVENOUS | Status: AC
  Administered 2011-12-23 (×4): 10 meq via INTRAVENOUS
  Filled 2011-12-23 (×4): qty 100

## 2011-12-23 MED ORDER — ALBUTEROL SULFATE (5 MG/ML) 0.5% IN NEBU: 2.5000 mg | INHALATION_SOLUTION | Freq: Four times a day (QID) | RESPIRATORY_TRACT | Status: DC | PRN

## 2011-12-23 NOTE — Progress Notes (Signed)
   CARE MANAGEMENT NOTE 12/23/2011  Patient:  LASHA, ECHEVERRIA   Account Number:  [id number]  Date Initiated:  12/23/2011  Documentation initiated by:  [facility]  Subjective/Objective Assessment:   PULMONARY SARCOIDOSIS   Chronic diastolic heart failure   FIBROMYALGIA   OBSTRUCTIVE SLEEP APNEA     Action/Plan:   Anticipated DC Date:  12/23/2011   Anticipated DC Plan:  HOME W HOME HEALTH SERVICES      DC Planning Services  CM consult      Choice offered to / List presented to:     DME arranged  [facility] - Lavone Nian      DME agency  [facility]     HH arranged  HH-2 PT      [facility] agency  [facility]   Status of service:  Completed, signed off Medicare Important Message given?   (If response is "NO", the following Medicare IM given date fields will be blank) Date Medicare IM given:   Date Additional Medicare IM given:    Discharge Disposition:  HOME W HOME HEALTH SERVICES  Per UR Regulation:    If discussed at Long Length of Stay Meetings, dates discussed:    Comments:  12/23/2011 1330 Spoke to pt and requesting [facility]. States she needed a portable tank for home.  Contacted [facility] for portable tank and RW for home. Pt will also need HH PT. Isidoro Donning RN CCM Case Mgmt phone [phone]

## 2011-12-23 NOTE — Discharge Summary (Signed)
[name], 46 y.o., DOB 1966-02-17, MRN [id number]. Admission date: 12/22/2011 Discharge Date 12/23/2011 Primary MD Rene Paci, MD, MD Admitting Physician Leroy Sea, MD  Admission Diagnosis  Morbid obesity [278.01] Diarrhea [787.91] Weakness [780.79] weakness/diarrhea  Discharge Diagnosis   Principal Problem:  *Diarrhea Active Problems:  PULMONARY SARCOIDOSIS  Chronic diastolic heart failure  FIBROMYALGIA  OBSTRUCTIVE SLEEP APNEA  COPD exacerbation  Diabetes type 2, uncontrolled    Past Medical History  Diagnosis Date  . Sleep apnea     noncompliant w/ CPAP  . COPD (chronic obstructive pulmonary disease)     moderate airflow obstruction, suspect d/t emphysema  . Hypertension   . Hyperlipidemia   . Chronic headache   . Fibromyalgia     daily narcotics  . Anxiety     hx chronic BZ use, stopped 07/2010  . Anemia   . Pulmonary sarcoidosis     unimpressive CT chest 2011  . Colonic polyp   . GERD (gastroesophageal reflux disease)   . ALLERGIC RHINITIS   . Asthma   . CHF (congestive heart failure)     Diastolic with fluid overload, May, 2012, LVEF 60%  . Morbid obesity   . Pneumonia 12/11/11    "first time since 01/2010; I get it often"  . Heart murmur   . Angina   . Sarcoidosis of skin   . Emphysema   . Shortness of breath on exertion   . Chronic back pain greater than 3 months duration   . Depression   . PTSD (post-traumatic stress disorder)   . Panic attacks     Past Surgical History  Procedure Date  . Polypectomy 2011  . Lumbar microdiscectomy 07/06/2011    R L4-5, stern  . Back surgery      Hospital Course See H&P, Labs, Consult and Test reports for all details in brief, patient was admitted for    1. Dehydration, sinus tachycardia and mild hyponatremia secondary to diarrhea caused from Glucophage - all resolved with stopping Glucophage and IVF, now no Diarrhea-cramping, will DC home.   2. Pulmonary sarcoidosis and COPD - appears to  be at baseline 2 L nasal cannula oxygen no decompensation we'll continue the same. No wheezing on exam.    3. History of chronic diastolic CHF currently compensated - resume Home Meds.   4. Diabetes mellitus type 2 - Lantus increased as Glucophage was stopped PO Amaryl continue - accucheck instructions provided.   Lab Results  Component Value Date   HGBA1C 7.4* 12/22/2011     5. History of obstructive sleep apnea - continue CPAP QHS.    6.Low K - extra IV KCL 40 meq given today will check K before DC, home K supplements and Aldactone continue, check BMP per PCP in 2 days.       Significant Tests:  See full reports for all details     Dg Chest 2 View  12/22/2011  *RADIOLOGY REPORT*  Clinical Data: Shortness of breath on oxygen. Recent treatment for pneumonia. Cough and history of congestive heart failure. Emphysema and COPD  CHEST - 2 VIEW  Comparison: 12/10/2011 and 10/25/2011  Findings: Changes of underlying COPD are evident.  Taking this into consideration heart size is mildly enlarged and stable.  A stable mediastinal configuration with prominence of the pulmonary arteries is seen.  The lung fields demonstrate diffuse increase in interstitial markings most notable at both lung bases and compatible with underlying chronic bronchitic change.  No change is noted in comparison with  prior exam from 04/2011. No new focal infiltrates or signs of congestive failure are seen. No new interstitial septal lines are seen to suggest early interstitial edema.  No pleural fluid is noted  Bony structures appear intact.  IMPRESSION: Stable chronic changes with underlying COPD.  No new focal or acute abnormality identified.  No explanation for the patient's shortness of breath is seen.  Original Report Authenticated By: Bertha Stakes, M.D.   Dg Chest 2 View  12/11/2011  *RADIOLOGY REPORT*  Clinical Data: Shortness of breath  CHEST - 2 VIEW  Comparison: 11/17/2011  Findings: Heart size is normal.   There is no pleural effusion or edema.  Chronic interstitial coarsening and bibasilar scarring is noted.  Superimposed airspace opacity is identified within the left base which may represent an acute infiltrate.  The visualized osseous structures are unremarkable.  IMPRESSION:  1.  Advanced interstitial coarsening and bibasilar scarring 2.  Left base opacity suspicious for superimposed infiltrate.  Original Report Authenticated By: Rosealee Albee, M.D.     Today   Subjective:   [name] today has no headache,no chest abdominal pain,no new weakness tingling or numbness, feels much better wants to go home today.    Objective:   Blood pressure 120/81, pulse 86, temperature 97.7 F (36.5 C), temperature source Oral, resp. rate 18, height 5\' 8"  (1.727 m), weight 137.576 kg (303 lb 4.8 oz), last menstrual period 01/28/2011, SpO2 92.00%.  Intake/Output Summary (Last 24 hours) at 12/23/11 0850 Last data filed at 12/23/11 0820  Gross per 24 hour  Intake   2067 ml  Output   3000 ml  Net   -933 ml    Exam Awake Alert, Oriented *3, No new F.N deficits, Normal affect [place].AT,PERRAL Supple Neck,No JVD, No cervical lymphadenopathy appriciated.  Symmetrical Chest wall movement, Good air movement bilaterally, CTAB RRR,No Gallops,Rubs or new Murmurs, No Parasternal Heave +ve B.Sounds, Abd Soft, Non tender, No organomegaly appriciated, No rebound -guarding or rigidity. No Cyanosis, Clubbing or edema, No new Rash or bruise  Data Review      CBC w Diff: Lab Results  Component Value Date   WBC 7.2 12/23/2011   HGB 12.6 12/23/2011   HCT 38.4 12/23/2011   PLT 173 12/23/2011   LYMPHOPCT 17 12/22/2011   MONOPCT 6 12/22/2011   EOSPCT 3 12/22/2011   BASOPCT 1 12/22/2011   CMP: Lab Results  Component Value Date   NA 136 12/23/2011   K 3.1* 12/23/2011   CL 96 12/23/2011   CO2 28 12/23/2011   BUN 7 12/23/2011   CREATININE 0.80 12/23/2011   PROT 8.0 12/22/2011   ALBUMIN 3.7 12/22/2011   BILITOT 0.8  12/22/2011   ALKPHOS 132* 12/22/2011   AST 38* 12/22/2011   ALT 41* 12/22/2011  . CBG (last 3)   Basename 12/22/11 2140 12/22/11 1534 12/22/11 1404  GLUCAP 184* 130* 172*      Micro Results No results found for this or any previous visit (from the past 240 hour(s)).   Discharge Instructions     Follow with Primary MD Rene Paci, MD, MD in 2 days   Get CBC, BMP, Blood Sugar results checked 2 days by Primary MD and again as instructed by your Primary MD.    Accuchecks 4 times/day, Once in AM empty stomach and then before each meal. Log in all results and show them to your Prim.MD in 3 days. If any glucose reading is under 80 or above 300 call your Prim MD  immidiately. Follow Low glucose instructions for glucose under 80 as instructed.   Get Medicines reviewed and adjusted.  Please request your Prim.MD to go over all Hospital Tests and Procedure/Radiological results at the follow up, please get all Hospital records sent to your Prim MD by signing hospital release before you go home.  Activity: Fall precautions use walker/cane & assistance as needed  Diet: Diabetic Low Sodium,  Fluid restriction 1.8 lit/day, Aspiration precautions.  Check your Weight same time everyday, if you gain over 2 pounds, or you develop in leg swelling, experience more shortness of breath or chest pain, call your Primary MD immediately. Follow Cardiac Low Salt Diet and 1.8 lit/day fluid restriction.  Disposition Home  If you experience worsening of your admission symptoms, develop shortness of breath, life threatening emergency, suicidal or homicidal thoughts you must seek medical attention immediately by calling 911 or calling your MD immediately  if symptoms less severe.  You Must read complete instructions/literature along with all the possible adverse reactions/side effects for all the Medicines you take and that have been prescribed to you. Take any new Medicines after you have completely  understood and accpet all the possible adverse reactions/side effects.   Do not drive if your were admitted for syncope or siezures until you have seen by Primary MD or a Neurologist and advised to drive.  Do not drive when taking Pain medications.    Do not take more than prescribed Pain, Sleep and Anxiety Medications  Special Instructions: If you have smoked or chewed Tobacco  in the last 2 yrs please stop smoking, stop any regular Alcohol  and or any Recreational drug use.  Wear Seat belts while driving.  Follow-up Information    Follow up with Rene Paci, MD. Schedule an appointment as soon as possible for a visit in 2 days.         Discharge Medications   Medication List  As of 12/23/2011  8:50 AM   CHANGE how you take these medications         insulin glargine 100 UNIT/ML injection   Commonly known as: LANTUS   Inject 14 Units into the skin at bedtime.   What changed: dose         CONTINUE taking these medications         * albuterol (2.5 MG/3ML) 0.083% nebulizer solution   Commonly known as: PROVENTIL      * albuterol 108 (90 BASE) MCG/ACT inhaler   Commonly known as: PROVENTIL HFA;VENTOLIN HFA      ALPRAZolam 1 MG tablet   Commonly known as: XANAX      amLODipine 5 MG tablet   Commonly known as: NORVASC   TAKE 1 TABLET BY MOUTH EVERY DAY      aspirin 81 MG EC tablet      budesonide-formoterol 160-4.5 MCG/ACT inhaler   Commonly known as: SYMBICORT      cetirizine 10 MG tablet   Commonly known as: ZYRTEC      glimepiride 1 MG tablet   Commonly known as: AMARYL      NEXIUM 40 MG capsule   Generic drug: esomeprazole   TAKE ONE CAPSULE BY MOUTH DAILY      oxyCODONE 10 MG 12 hr tablet   Commonly known as: OXYCONTIN      potassium chloride SA 20 MEQ tablet   Commonly known as: K-DUR,KLOR-CON      promethazine 25 MG tablet   Commonly known as: PHENERGAN  sertraline 25 MG tablet   Commonly known as: ZOLOFT      spironolactone 25 MG  tablet   Commonly known as: ALDACTONE      tiotropium 18 MCG inhalation capsule   Commonly known as: SPIRIVA   Place 1 capsule (18 mcg total) into inhaler and inhale daily.      torsemide 100 MG tablet   Commonly known as: DEMADEX   Take 1 tablet (100 mg total) by mouth 2 (two) times daily.     * Notice: This list has 2 medication(s) that are the same as other medications prescribed for you. Read the directions carefully, and ask your doctor or other care provider to review them with you.       STOP taking these medications         metFORMIN 500 MG tablet          Where to get your medications       Information on where to get these meds is not yet available. Ask your nurse or doctor.         insulin glargine 100 UNIT/ML injection             Total Time in preparing paper work, data evaluation and todays exam - 35 minutes  Leroy Sea M.D on 12/23/2011 at 8:50 AM  [facility] Office  [phone]

## 2011-12-23 NOTE — Discharge Instructions (Signed)
Follow with Primary MD Rene Paci, MD, MD in 2 days   Get CBC, BMP, Blood Sugar results checked 2 days by Primary MD and again as instructed by your Primary MD.    Accuchecks 4 times/day, Once in AM empty stomach and then before each meal. Log in all results and show them to your Prim.MD in 3 days. If any glucose reading is under 80 or above 300 call your Prim MD immidiately. Follow Low glucose instructions for glucose under 80 as instructed.   Get Medicines reviewed and adjusted.  Please request your Prim.MD to go over all Hospital Tests and Procedure/Radiological results at the follow up, please get all Hospital records sent to your Prim MD by signing hospital release before you go home.  Follow-up Information    Follow up with Rene Paci, MD. Schedule an appointment as soon as possible for a visit in 2 days.         Activity: Fall precautions use walker/cane & assistance as needed  Diet: Diabetic Low Sodium,  Fluid restriction 1.8 lit/day, Aspiration precautions.  Check your Weight same time everyday, if you gain over 2 pounds, or you develop in leg swelling, experience more shortness of breath or chest pain, call your Primary MD immediately. Follow Cardiac Low Salt Diet and 1.8 lit/day fluid restriction.  Disposition Home  If you experience worsening of your admission symptoms, develop shortness of breath, life threatening emergency, suicidal or homicidal thoughts you must seek medical attention immediately by calling 911 or calling your MD immediately  if symptoms less severe.  You Must read complete instructions/literature along with all the possible adverse reactions/side effects for all the Medicines you take and that have been prescribed to you. Take any new Medicines after you have completely understood and accpet all the possible adverse reactions/side effects.   Do not drive if your were admitted for syncope or siezures until you have seen by Primary MD or a  Neurologist and advised to drive.  Do not drive when taking Pain medications.    Do not take more than prescribed Pain, Sleep and Anxiety Medications  Special Instructions: If you have smoked or chewed Tobacco  in the last 2 yrs please stop smoking, stop any regular Alcohol  and or any Recreational drug use.  Wear Seat belts while driving.

## 2011-12-23 NOTE — Progress Notes (Signed)
Physical Therapy Note: order received, chart reviewed and spoke with pt. Familiar with pt from last admission and pt has been up with staff ambulating in room today. Last admission pt was recommended RW and OPPT followup pt reports neither of those were setup at discharge and would still be beneficial. Pt reports no need for acute therapy at this time secondary to discharge already written. Signing off. Thanks Delaney Meigs, PT [phone]

## 2011-12-24 NOTE — Progress Notes (Signed)
Client's home health agency, [facility] brought her oxygen and walker prior to release.  Client verbalized understanding of discharge instructions.  Output 3750 for 24 hours prior to release.  IV removed, tolerated without incident.  Very pleasant and cooperative.  To follow up with [facility] as soon as possible which is the client's primary care provider.    Joycie Peek RN

## 2011-12-24 NOTE — ED Provider Notes (Signed)
History     CSN: [id number]  Arrival date & time 12/22/11  1349   First MD Initiated Contact with Patient 12/22/11 1604      Chief Complaint  Patient presents with  . Diarrhea  . Weakness  . Shortness of Breath  . Abdominal Pain  . Rash     HPI Patient reports she has had diarrhea for 4 days. She was just d/c from hospital for pneumonia. Patient stopped abt on Sunday. She reports she is feeling weak and dizzy. Her md is concerned due to her having diabetes  Past Medical History  Diagnosis Date  . Sleep apnea     noncompliant w/ CPAP  . COPD (chronic obstructive pulmonary disease)     moderate airflow obstruction, suspect d/t emphysema  . Hypertension   . Hyperlipidemia   . Chronic headache   . Fibromyalgia     daily narcotics  . Anxiety     hx chronic BZ use, stopped 07/2010  . Anemia   . Pulmonary sarcoidosis     unimpressive CT chest 2011  . Colonic polyp   . GERD (gastroesophageal reflux disease)   . ALLERGIC RHINITIS   . Asthma   . CHF (congestive heart failure)     Diastolic with fluid overload, May, 2012, LVEF 60%  . Morbid obesity   . Pneumonia 12/11/11    "first time since 01/2010; I get it often"  . Heart murmur   . Angina   . Sarcoidosis of skin   . Emphysema   . Shortness of breath on exertion   . Chronic back pain greater than 3 months duration   . Depression   . PTSD (post-traumatic stress disorder)   . Panic attacks     Past Surgical History  Procedure Date  . Polypectomy 2011  . Lumbar microdiscectomy 07/06/2011    R L4-5, stern  . Back surgery     Family History  Problem Relation Age of Onset  . Hypertension Mother   . Emphysema Father   . Hypertension Father   . Stomach cancer Father   . Allergies Brother   . Hypertension Brother   . Stomach cancer Brother   . Heart disease Mother   . Heart disease Father   . Heart disease Brother     History  Substance Use Topics  . Smoking status: Former Smoker -- 1.0 packs/day for 29  years    Types: Cigarettes    Quit date: 09/14/2010  . Smokeless tobacco: Never Used  . Alcohol Use: No    OB History    Grav Para Term Preterm Abortions TAB SAB Ect Mult Living                  Review of Systems  All other systems reviewed and are negative.    Allergies  Ciprofloxacin; Latex; Sulfamethoxazole w/trimethoprim; Azithromycin; and Doxycycline  Home Medications   Current Outpatient Rx  Name Route Sig Dispense Refill  . ALBUTEROL SULFATE HFA 108 (90 BASE) MCG/ACT IN AERS Inhalation Inhale 2 puffs into the lungs every 6 (six) hours as needed. For wheeze or shortness of breath    . ALBUTEROL SULFATE (2.5 MG/3ML) 0.083% IN NEBU Nebulization Take 2.5 mg by nebulization every 6 (six) hours as needed. For wheeze or shortness of breath    . ALPRAZOLAM 1 MG PO TABS Oral Take 1 mg by mouth 2 (two) times daily as needed. For anxiety    . AMLODIPINE BESYLATE 5 MG PO TABS  TAKE 1 TABLET BY MOUTH EVERY DAY 30 tablet 5  . ASPIRIN 81 MG PO TBEC Oral Take 81 mg by mouth daily.    . BUDESONIDE-FORMOTEROL FUMARATE 160-4.5 MCG/ACT IN AERO Inhalation Inhale 2 puffs into the lungs 2 (two) times daily.      Marland Kitchen CETIRIZINE HCL 10 MG PO TABS Oral Take 10 mg by mouth at bedtime.    Marland Kitchen GLIMEPIRIDE 1 MG PO TABS Oral Take 1 mg by mouth daily before breakfast.    . NEXIUM 40 MG PO CPDR  TAKE ONE CAPSULE BY MOUTH DAILY 30 capsule 5  . OXYCODONE HCL ER 10 MG PO TB12 Oral Take 10 mg by mouth 2 (two) times daily.     Marland Kitchen POTASSIUM CHLORIDE CRYS ER 20 MEQ PO TBCR Oral Take 20 mEq by mouth 2 (two) times daily.     Marland Kitchen PROMETHAZINE HCL 25 MG PO TABS Oral Take 25 mg by mouth every 6 (six) hours as needed. For nausea    . SERTRALINE HCL 25 MG PO TABS Oral Take 25 mg by mouth daily.     Marland Kitchen SPIRONOLACTONE 25 MG PO TABS  Take 1 by mouth daily    . TIOTROPIUM BROMIDE MONOHYDRATE 18 MCG IN CAPS Inhalation Place 1 capsule (18 mcg total) into inhaler and inhale daily. 30 capsule 5  . TORSEMIDE 100 MG PO TABS Oral Take  1 tablet (100 mg total) by mouth 2 (two) times daily. 60 tablet 2  . INSULIN GLARGINE 100 UNIT/ML [place] SOLN Subcutaneous Inject 14 Units into the skin at bedtime. 10 mL     BP 93/50  Pulse 88  Temp(Src) 97.8 F (36.6 C) (Oral)  Resp 18  Ht 5\' 8"  (1.727 m)  Wt 303 lb 4.8 oz (137.576 kg)  BMI 46.12 kg/m2  SpO2 96%  LMP 01/28/2011  Physical Exam  Nursing note and vitals reviewed. Constitutional: She is oriented to person, place, and time. She appears well-developed and well-nourished. No distress.  HENT:  Head: Normocephalic and atraumatic.  Eyes: Pupils are equal, round, and reactive to light.  Neck: Normal range of motion.  Cardiovascular: Normal rate and intact distal pulses.   Pulmonary/Chest: No respiratory distress. She has no wheezes.  Abdominal: Soft. Normal appearance and bowel sounds are normal. She exhibits no distension. There is no rebound and no guarding.  Musculoskeletal: Normal range of motion.  Neurological: She is alert and oriented to person, place, and time. No cranial nerve deficit.  Skin: Skin is warm and dry. No rash noted.  Psychiatric: She has a normal mood and affect. Her behavior is normal.    ED Course  Procedures (including critical care time) Scheduled Meds:   Continuous Infusions:   PRN Meds:.    Labs Reviewed  COMPREHENSIVE METABOLIC PANEL - Abnormal; Notable for the following:    Sodium 134 (*)    Chloride 95 (*)    Glucose, Bld 156 (*)    AST 38 (*)    ALT 41 (*)    Alkaline Phosphatase 132 (*)    All other components within normal limits  CBC - Abnormal; Notable for the following:    RBC 5.20 (*)    RDW 18.0 (*)    All other components within normal limits  GLUCOSE, CAPILLARY - Abnormal; Notable for the following:    Glucose-Capillary 172 (*)    All other components within normal limits  GLUCOSE, CAPILLARY - Abnormal; Notable for the following:    Glucose-Capillary 130 (*)  All other components within normal limits  BASIC  METABOLIC PANEL - Abnormal; Notable for the following:    Potassium 3.1 (*)    Glucose, Bld 107 (*)    GFR calc non Af Amer 88 (*)    All other components within normal limits  CBC - Abnormal; Notable for the following:    RDW 18.3 (*)    All other components within normal limits  CBC - Abnormal; Notable for the following:    RDW 18.0 (*)    All other components within normal limits  CREATININE, SERUM - Abnormal; Notable for the following:    GFR calc non Af Amer 86 (*)    All other components within normal limits  HEMOGLOBIN A1C - Abnormal; Notable for the following:    Hemoglobin A1C 7.4 (*)    Mean Plasma Glucose 166 (*)    All other components within normal limits  GLUCOSE, CAPILLARY - Abnormal; Notable for the following:    Glucose-Capillary 184 (*)    All other components within normal limits  GLUCOSE, CAPILLARY - Abnormal; Notable for the following:    Glucose-Capillary 126 (*)    All other components within normal limits  GLUCOSE, CAPILLARY - Abnormal; Notable for the following:    Glucose-Capillary 118 (*)    All other components within normal limits  GLUCOSE, CAPILLARY - Abnormal; Notable for the following:    Glucose-Capillary 148 (*)    All other components within normal limits  DIFFERENTIAL  PRO B NATRIURETIC PEPTIDE  POTASSIUM  LAB REPORT - SCANNED  CLOSTRIDIUM DIFFICILE BY PCR   No results found.   1. Diarrhea   2. Weakness   3. Morbid obesity   4. PULMONARY SARCOIDOSIS   5. Chronic diastolic heart failure   6. FIBROMYALGIA   7. OBSTRUCTIVE SLEEP APNEA   8. COPD exacerbation   9. Diabetes type 2, uncontrolled   10. HYPERLIPIDEMIA   11. ANEMIA   12. ANXIETY   13. HYPERTENSION   14. ALLERGIC RHINITIS   15. COPD   16. Chronic respiratory failure   17. GERD   18. AMENORRHEA   19. HEADACHE, CHRONIC   20. Sciatica of right side   21. CHF (congestive heart failure)   22. Ejection fraction   23. Obesity hypoventilation syndrome   24. CAP (community  acquired pneumonia)   25. Hypokalemia       MDM          Nelia Shi, MD 12/24/11 2156

## 2011-12-25 ENCOUNTER — Telehealth: Payer: Self-pay

## 2011-12-25 NOTE — Telephone Encounter (Signed)
Triage Call Report Triage Record Num: [id number] Operator: Durward Mallard [facility] Patient Name: [name] Call Date & Time: 12/22/2011 12:47:58PM Patient Phone: [phone] PCP: Rene Paci Patient Gender: Female PCP Fax : [phone] Patient DOB: 07/03/1966 Practice Name: [facility] Reason for Call: Caller: Jayley/Patient; PCP: Rene Paci; CB#: [phone]; Call regarding Very Weak and Diarrhea; sx started 12/19/11; diarrhea x3 today; no vomiting; no fever; last time urinated 1 hr ago; Triaged per Diabetes: GI Problems Guideline; See in ED Immed d/t gerneralized abdominal pain and loss of appetite and not able to carry out activities; will comply and go to [facility] with sister driving Protocol(s) Used: Diabetes: Gastrointestinal Problems Recommended Outcome per Protocol: See ED Immediately Reason for Outcome: Generalized abdominal pain that progresses to localized pain AND any of the following: loss of appetite, vomiting starting after pain, any fever, OR unable to carry out normal activities Care Advice: ~ Another adult should drive. ~ IMMEDIATE ACTION Write down provider's name. List or place the following in a bag for transport with the patient: current prescription and/or nonprescription medications; alternative treatments, therapies and medications; and street drugs. ~ 12/22/2011 12:57:37PM Page 1 of 1 [email or web address]

## 2011-12-27 ENCOUNTER — Ambulatory Visit (INDEPENDENT_AMBULATORY_CARE_PROVIDER_SITE_OTHER): Payer: Medicare Other | Admitting: Internal Medicine

## 2011-12-27 ENCOUNTER — Encounter: Payer: Self-pay | Admitting: Internal Medicine

## 2011-12-27 VITALS — BP 110/72 | HR 100 | Temp 98.5°F | Ht 66.0 in | Wt 311.0 lb

## 2011-12-27 DIAGNOSIS — I5032 Chronic diastolic (congestive) heart failure: Secondary | ICD-10-CM

## 2011-12-27 DIAGNOSIS — E1165 Type 2 diabetes mellitus with hyperglycemia: Secondary | ICD-10-CM

## 2011-12-27 DIAGNOSIS — I509 Heart failure, unspecified: Secondary | ICD-10-CM

## 2011-12-27 DIAGNOSIS — J189 Pneumonia, unspecified organism: Secondary | ICD-10-CM

## 2011-12-27 MED ORDER — ALPRAZOLAM 1 MG PO TABS: 1.0000 mg | ORAL_TABLET | Freq: Two times a day (BID) | ORAL | Status: DC | PRN

## 2011-12-27 MED ORDER — INSULIN GLARGINE 100 UNIT/ML ~~LOC~~ SOLN: 16.0000 [IU] | Freq: Every day | SUBCUTANEOUS | Status: DC

## 2011-12-27 MED ORDER — ALBUTEROL SULFATE (2.5 MG/3ML) 0.083% IN NEBU: 2.5000 mg | INHALATION_SOLUTION | Freq: Four times a day (QID) | RESPIRATORY_TRACT | Status: DC | PRN

## 2011-12-27 MED ORDER — ALBUTEROL SULFATE HFA 108 (90 BASE) MCG/ACT IN AERS: 2.0000 | INHALATION_SPRAY | Freq: Four times a day (QID) | RESPIRATORY_TRACT | Status: DC | PRN

## 2011-12-27 NOTE — Patient Instructions (Signed)
It was good to see you today. We have reviewed your prior records including hospital and ER labs and tests today - Continue to monitor your weight and call if increased more than 3 pounds in 24 hours Continue to monitor sodium in your diet - keep to low sodium!! Increase Demadex as discussed to decrease weight Increase Lantus to 16 units nightly, continue Amaryl Other Medications reviewed, no additional changes at this time. Refill on medication(s) as discussed today. Please schedule followup in 3 months for diabetes mellitus check, call sooner if problems.

## 2011-12-27 NOTE — Assessment & Plan Note (Signed)
New dx clarified 11/2011 -  Intol of metformin due to severe diarrhea On Lantus qhs and Amaryl in AM - titrate up lantus a bit now Continue with plans for DM education and home cbgs recheck a1c in 3 mo, sooner if problems Lab Results  Component Value Date   HGBA1C 7.4* 12/22/2011

## 2011-12-27 NOTE — Progress Notes (Signed)
Subjective:    [name], female    DOB: [birth year], 46 y.o.   MRN: [id number]  HPI Here for follow up - Since last visit, hospitalized March 17 -24 for community-acquired pneumonia, new diagnosis of diabetes Rehospitalized March 29-30 for diarrhea and dehydration related to initiated metformin  also reviewed chronic medical issues:  chronic hypoxic resp failure - home O2 dep since 2010 - overlap with OSA/OHS and obst dz per pulm - BiPAP qhs - reports compliance with ongoing medical treatment and home O2 and no changes in medication dose or frequency. denies adverse side effects related to current therapy. Quit smoking 08/2010!!  HTN - reports compliance with ongoing medical treatment and no changes in medication dose or frequency. denies adverse side effects related to current therapy - prior rash from dilt resolved with change back to amlodipine  dCHF, chronic - IP echo 01/2011 reviewed: normal LVEF - reports compliance with ongoing medical treatment. denies adverse side effects related to current therapy. improved edema and dyspnea  - continues bid diuretic - eval by cards for same 02/2011 - to limit fluids <2L/d  morbid obesity - weights reviewed - often has fluid related weight variance but remains obese  Anxiety/depression - chronic hx same with daily BZ use - changed from klonopin to xanax 07/2010 due to "too sleepy" - prev on sertraline, would like to resume same  GERD - reports compliance with ongoing medical treatment and no changes in medication dose or frequency. denies adverse side effects related to current therapy.   dyslipidemia -started on simva spring 2011- reports intermittent compliance with ongoing medical treatment and no changes in medication dose or frequency. denies adverse side effects related to current therapy.   fibromyalgia - chronic pain - on daily narcotics for years - would like to reduce dose if possible (reports on "10mg " while in [place] - now on  "5" - but note prev hydrocod, now oxy) - pain affects chest, back, legs and "whole body" - resumed flexeril  DM2, mild - dx clarified 3/13 - check cbgs BID - AM fasting <150, none over 200 since going home - started metformin and amaryl but stopped after 3 days due to severe diarrhea - now on Lantus, no hypoglycemia  Past Medical History  Diagnosis Date  . Sleep apnea     noncompliant w/ CPAP  . COPD (chronic obstructive pulmonary disease)     moderate airflow obstruction, suspect d/t emphysema  . Hypertension   . Hyperlipidemia   . Chronic headache   . Fibromyalgia     daily narcotics  . Anxiety     hx chronic BZ use, stopped 07/2010  . Anemia   . Pulmonary sarcoidosis     unimpressive CT chest 2011  . Colonic polyp   . GERD (gastroesophageal reflux disease)   . ALLERGIC RHINITIS   . Asthma   . CHF (congestive heart failure)     Diastolic with fluid overload, May, 2012, LVEF 60%  . Morbid obesity   . Depression   . Panic attacks     Review of Systems  Constitutional: Positive for fatigue. Negative for fever and unexpected weight change.  Respiratory: Negative for cough, shortness of breath and wheezing.   Cardiovascular: Negative for chest pain, palpitations and leg swelling.  Neurological: Negative for headaches.       Objective:   Physical Exam  BP 110/72  Pulse 100  Temp(Src) 98.5 F (36.9 C) (Oral)  Ht 5\' 6"  (1.676 m)  Wt  311 lb (141.069 kg)  BMI 50.20 kg/m2  SpO2 92%  LMP 01/28/2011 Wt Readings from Last 3 Encounters:  12/27/11 311 lb (141.069 kg)  12/23/11 303 lb 4.8 oz (137.576 kg)  12/17/11 309 lb 4.8 oz (140.298 kg)   Constitutional: She appears well-developed and well-nourished. Morbidly obese.  Cardiovascular: Normal rate, regular rhythm and normal heart sounds.  BLE with trace edema Pulmonary/Chest: Breath sounds diminished bilateral bases but no respiratory distress. She has no wheezes.  Neurological: She is alert and oriented to person, place,  and time. Coordination normal.  Psychiatric: She has a normal mood and affect. Her behavior is normal.    Lab Results  Component Value Date   WBC 7.2 12/23/2011   HGB 12.6 12/23/2011   HCT 38.4 12/23/2011   PLT 173 12/23/2011   CHOL  Value: 189        ATP III CLASSIFICATION:  <200     mg/dL   Desirable  161-096  mg/dL   Borderline High  >=045    mg/dL   High        01/01/8118   TRIG 89 03/07/2010   HDL 79 03/07/2010   ALT 41* 12/22/2011   AST 38* 12/22/2011   NA 136 12/23/2011   K 4.0 12/23/2011   CL 96 12/23/2011   CREATININE 0.80 12/23/2011   BUN 7 12/23/2011   CO2 28 12/23/2011   TSH 3.755 12/11/2011   HGBA1C 7.4* 12/22/2011       Assessment & Plan:  See problem list. Medications and labs reviewed today.  Time spent with pt today 25 minutes, greater than 50% time spent counseling patient on new dx diabetes, CHF and medication review. Also review of prior hospital records

## 2011-12-27 NOTE — Assessment & Plan Note (Signed)
hosp for same 3/13 reviewed - symptoms resolved - pulmonary status now base line

## 2011-12-27 NOTE — Assessment & Plan Note (Signed)
Acute exac 10/2011- treated with increase diuretic x 4 days by pulm -  now s/p IVF for "dehydration with diarrhea 3/13" and subsequent increase in weight advised to increase Demadex x 72h, monitor sodium intake and monitor weights at home - call if increase weight >3#/day

## 2012-01-01 ENCOUNTER — Telehealth: Payer: Self-pay | Admitting: Internal Medicine

## 2012-01-01 MED ORDER — OXYCODONE HCL 10 MG PO TABS: 5.0000 mg | ORAL_TABLET | Freq: Two times a day (BID) | ORAL | Status: DC

## 2012-01-01 NOTE — Telephone Encounter (Signed)
Pt requesting oxycodone prescription so she can come in and pick it up--pt ph# [phone]

## 2012-01-01 NOTE — Telephone Encounter (Signed)
ok 

## 2012-01-01 NOTE — Telephone Encounter (Signed)
Notified pt rx ready for pick-up.... 01/01/12@1 :35pm/LMB

## 2012-01-03 ENCOUNTER — Telehealth: Payer: Self-pay | Admitting: *Deleted

## 2012-01-03 NOTE — Telephone Encounter (Signed)
Received Pa form place on md desk for completion... 01/03/12@12 :18pm/LMB

## 2012-01-03 NOTE — Telephone Encounter (Signed)
Received fax pt needing Pa on albuterol for her nebulizer. Called insurance spoke with Selena Batten faxing over PA form to be completed.... 01/03/12@12 :08pm/LMB

## 2012-01-03 NOTE — Telephone Encounter (Signed)
Faxed Pa back to insurance company await for approval status... 01/03/12@2 :36pm/LMB

## 2012-01-03 NOTE — Telephone Encounter (Signed)
done

## 2012-01-06 ENCOUNTER — Other Ambulatory Visit: Payer: Self-pay | Admitting: Pulmonary Disease

## 2012-01-10 ENCOUNTER — Telehealth: Payer: Self-pay | Admitting: Internal Medicine

## 2012-01-10 ENCOUNTER — Emergency Department (INDEPENDENT_AMBULATORY_CARE_PROVIDER_SITE_OTHER): Payer: Medicare Other

## 2012-01-10 ENCOUNTER — Ambulatory Visit: Payer: Medicare Other | Admitting: Pulmonary Disease

## 2012-01-10 ENCOUNTER — Emergency Department (INDEPENDENT_AMBULATORY_CARE_PROVIDER_SITE_OTHER)
Admission: EM | Admit: 2012-01-10 | Discharge: 2012-01-10 | Disposition: A | Discharge status: Home / Self Care | Payer: Medicare Other | Source: Home / Self Care | Attending: Emergency Medicine | Admitting: Emergency Medicine

## 2012-01-10 ENCOUNTER — Encounter (HOSPITAL_COMMUNITY): Payer: Self-pay

## 2012-01-10 DIAGNOSIS — S92919A Unspecified fracture of unspecified toe(s), initial encounter for closed fracture: Secondary | ICD-10-CM

## 2012-01-10 DIAGNOSIS — S92912A Unspecified fracture of left toe(s), initial encounter for closed fracture: Secondary | ICD-10-CM | POA: Insufficient documentation

## 2012-01-10 DIAGNOSIS — S92515A Nondisplaced fracture of proximal phalanx of left lesser toe(s), initial encounter for closed fracture: Secondary | ICD-10-CM

## 2012-01-10 HISTORY — DX: Obesity, unspecified: E66.9

## 2012-01-10 MED ORDER — MELOXICAM 15 MG PO TABS: 15.0000 mg | ORAL_TABLET | Freq: Every day | ORAL | Status: DC

## 2012-01-10 NOTE — Telephone Encounter (Signed)
Ok - may use oxy 10mg  3x/d for up to 30 days - call if early refill on oxy 10 is needed

## 2012-01-10 NOTE — Telephone Encounter (Signed)
Called pt no answer LMOM md response... 01/10/12@5 :10pm/LMB

## 2012-01-10 NOTE — Telephone Encounter (Signed)
Haven't received status bck from insurance. Has been fax to them twice closing note... 01/10/12@11 :30am/LMB

## 2012-01-10 NOTE — Discharge Instructions (Signed)
You may take 1 g of Tylenol up to 4 times a day. This with the meloxicam is an effective combination for pain. He may take care OxyContin for severe pain as well. Return if you get worse, have a fever above 100.4, or for any other concerns. To followup with her Dr. about starting a regular exercise program, either with a physical therapist work with a Systems analyst. Diet and exercise really does help with many medical problems including diabetes, high blood pressure, sleep apnea, depression.

## 2012-01-10 NOTE — ED Notes (Signed)
States she accidentally struck her left foot , and injured 4th toe

## 2012-01-10 NOTE — ED Notes (Signed)
IV catheters were absent on arrival; documentation to remove from record

## 2012-01-10 NOTE — Telephone Encounter (Signed)
[name] has broken a toe on her left foot.  She went to [facility] urgent care on [address].  She wants permission to take extra pain medicine.  She is on oxycodone.

## 2012-01-10 NOTE — ED Provider Notes (Addendum)
History     CSN: [id number]  Arrival date & time 01/10/12  1406   First MD Initiated Contact with Patient 01/10/12 1450      Chief Complaint  Patient presents with  . Foot Injury    (Consider location/radiation/quality/duration/timing/severity/associated sxs/prior treatment) HPI Comments: Patient states she accidentally kicked the corner of a doorway while barefoot. Her foot and between the third and fourth toes. The blow  forced her left 4th left toe outwards. Patient now reports pain, swelling, deformity at the toe. No bruising. Some limitation of motion due to pain. No paresthesias, numbness. No other injury to her foot. Patient is a newly diagnosed diabetic, but states her glucoses are running under 200. States she checks her glucose once daily. Patient has no other complaints.  ROS as noted in HPI. All other ROS negative.    Patient is a 46 y.o. female presenting with foot injury. The history is provided by the patient. No language interpreter was used.  Foot Injury  The incident occurred 1 to 2 hours ago. The incident occurred at home. The pain is present in the left foot. The quality of the pain is described as aching. Pertinent negatives include no numbness, no inability to bear weight, no muscle weakness, no loss of sensation and no tingling. The symptoms are aggravated by bearing weight and palpation. She has tried nothing for the symptoms. The treatment provided no relief.    Past Medical History  Diagnosis Date  . Sleep apnea     noncompliant w/ CPAP  . Hypertension   . Hyperlipidemia   . Chronic headache   . Fibromyalgia     daily narcotics  . Anxiety     hx chronic BZ use, stopped 07/2010  . Anemia   . Pulmonary sarcoidosis     unimpressive CT chest 2011  . Colonic polyp   . GERD (gastroesophageal reflux disease)   . ALLERGIC RHINITIS   . Asthma   . CHF (congestive heart failure)     Diastolic with fluid overload, May, 2012, LVEF 60%  . Morbid obesity   .  Depression   . Panic attacks   . Diabetes mellitus   . COPD (chronic obstructive pulmonary disease)     on home O2, moderate airflow obstruction, suspect d/t emphysema  . Obesity     Past Surgical History  Procedure Date  . Polypectomy 2011  . Lumbar microdiscectomy 07/06/2011    R L4-5, stern  . Back surgery     Family History  Problem Relation Age of Onset  . Hypertension Mother   . Emphysema Father   . Hypertension Father   . Stomach cancer Father   . Allergies Brother   . Hypertension Brother   . Stomach cancer Brother   . Heart disease Mother   . Heart disease Father   . Heart disease Brother     History  Substance Use Topics  . Smoking status: Former Smoker -- 1.0 packs/day for 29 years    Types: Cigarettes    Quit date: 09/14/2010  . Smokeless tobacco: Never Used  . Alcohol Use: No    OB History    Grav Para Term Preterm Abortions TAB SAB Ect Mult Living                  Review of Systems  Neurological: Negative for tingling and numbness.    Allergies  Ciprofloxacin; Latex; Sulfamethoxazole w/trimethoprim; Azithromycin; and Doxycycline  Home Medications   Current Outpatient Rx  Name Route Sig Dispense Refill  . ALBUTEROL SULFATE HFA 108 (90 BASE) MCG/ACT IN AERS Inhalation Inhale 2 puffs into the lungs every 6 (six) hours as needed. For wheeze or shortness of breath 18 g 2  . ALBUTEROL SULFATE (2.5 MG/3ML) 0.083% IN NEBU Nebulization Take 3 mLs (2.5 mg total) by nebulization every 6 (six) hours as needed. For wheeze or shortness of breath 75 mL 2  . ALPRAZOLAM 1 MG PO TABS Oral Take 1 tablet (1 mg total) by mouth 2 (two) times daily as needed for anxiety. 60 tablet 1  . AMLODIPINE BESYLATE 5 MG PO TABS  TAKE 1 TABLET BY MOUTH EVERY DAY 30 tablet 5  . ASPIRIN 81 MG PO TBEC Oral Take 81 mg by mouth daily.    Marland Kitchen CETIRIZINE HCL 10 MG PO TABS Oral Take 10 mg by mouth at bedtime.    Marland Kitchen GLIMEPIRIDE 1 MG PO TABS Oral Take 1 mg by mouth daily before  breakfast.    . INSULIN GLARGINE 100 UNIT/ML [place] SOLN Subcutaneous Inject 16 Units into the skin at bedtime. 10 mL   . MELOXICAM 15 MG PO TABS Oral Take 1 tablet (15 mg total) by mouth daily. 14 tablet 0  . NEXIUM 40 MG PO CPDR  TAKE ONE CAPSULE BY MOUTH DAILY 30 capsule 5  . OXYCODONE HCL 10 MG PO TABS Oral Take 0.5 tablets (5 mg total) by mouth 2 (two) times daily. Or as directed/as needed 60 each 0  . POTASSIUM CHLORIDE CRYS ER 20 MEQ PO TBCR Oral Take 20 mEq by mouth 2 (two) times daily.     Marland Kitchen PROMETHAZINE HCL 25 MG PO TABS Oral Take 25 mg by mouth every 6 (six) hours as needed. For nausea    . SERTRALINE HCL 25 MG PO TABS Oral Take 25 mg by mouth daily.     Marland Kitchen SPIRONOLACTONE 25 MG PO TABS  Take 1 by mouth daily    . SYMBICORT 160-4.5 MCG/ACT IN AERO  USE 2 PUFFS BY MOUTH TWICE DAILY 10.2 g 5  . TIOTROPIUM BROMIDE MONOHYDRATE 18 MCG IN CAPS Inhalation Place 1 capsule (18 mcg total) into inhaler and inhale daily. 30 capsule 5  . TORSEMIDE 100 MG PO TABS Oral Take 1 tablet (100 mg total) by mouth 2 (two) times daily. 60 tablet 2    BP 114/65  Pulse 96  Temp(Src) 98.1 F (36.7 C) (Oral)  Resp 18  SpO2 94%  LMP 01/28/2011  Physical Exam  Nursing note and vitals reviewed. Constitutional: She is oriented to person, place, and time. She appears well-developed and well-nourished. No distress.  HENT:  Head: Normocephalic and atraumatic.  Eyes: Conjunctivae and EOM are normal.  Neck: Normal range of motion.  Cardiovascular: Normal rate.   Pulmonary/Chest: Effort normal.  Abdominal: She exhibits no distension.  Musculoskeletal: Normal range of motion.       Feet:       Skin intact. No bruising. Rest of left foot nontender, exam normal.  Neurological: She is alert and oriented to person, place, and time.  Skin: Skin is warm and dry.  Psychiatric: She has a normal mood and affect. Her behavior is normal. Judgment and thought content normal.    ED Course  Procedures (including critical  care time)  Labs Reviewed - No data to display Dg Foot Complete Left  01/10/2012  *RADIOLOGY REPORT*  Clinical Data: Blow to the foot.  Pain in the fourth toe.  LEFT FOOT - COMPLETE 3+ VIEW  Comparison: None.  Findings: The patient has a nondisplaced fracture through the diaphysis of the proximal phalanx of the fourth toe.  No other acute bony or joint abnormality is identified.  IMPRESSION: Nondisplaced fracture proximal diaphysis the fourth toe.  Original Report Authenticated By: Bernadene Bell. D'ALESSIO, M.D.     1. Closed nondisp fracture of proximal phalanx of lesser toe of left foot     Dg Foot Complete Left  01/10/2012  *RADIOLOGY REPORT*  Clinical Data: Blow to the foot.  Pain in the fourth toe.  LEFT FOOT - COMPLETE 3+ VIEW  Comparison: None.  Findings: The patient has a nondisplaced fracture through the diaphysis of the proximal phalanx of the fourth toe.  No other acute bony or joint abnormality is identified.  IMPRESSION: Nondisplaced fracture proximal diaphysis the fourth toe.  Original Report Authenticated By: Bernadene Bell. Maricela Curet, M.D.    Imaging reviewed by myself. Report per radiologist.  MDM  Checking x-ray. Patient has deformity of the fourth left toe at prox phalanx. Patient is able to move it, although it hurts. Sensation grossly intact. Also tenderness at the MTP joint. No other apparent injury. Will send home with Ace wrap, buddy taping, stiff soled shoe, crutches unless patient has a significant fracture.  Extensive discussion with patient about the importance of checking her feet on a regular basis to prevent diabetic foot infections, and exercise/ diet as a means to help with her diabetes, high blood pressure, sleep apnea, and obesity. Suggested that a personal trainer or physical therapist may be helpful in devising and exercise plan for her. Patient will followup with her primary care physician about this. Discussed imaging results, and plan with patient. Patient agrees  with plan.  Recent bloodwork shows kidney fcn WNL. homeon nsaids.    Luiz Blare, MD 01/10/12 2440  Luiz Blare, MD 01/10/12 [phone]

## 2012-01-17 ENCOUNTER — Ambulatory Visit (INDEPENDENT_AMBULATORY_CARE_PROVIDER_SITE_OTHER): Payer: Medicare Other | Admitting: Internal Medicine

## 2012-01-17 ENCOUNTER — Encounter: Payer: Self-pay | Admitting: Internal Medicine

## 2012-01-17 VITALS — BP 120/62 | HR 101 | Temp 98.2°F | Ht 66.0 in | Wt 313.0 lb

## 2012-01-17 DIAGNOSIS — I509 Heart failure, unspecified: Secondary | ICD-10-CM

## 2012-01-17 DIAGNOSIS — S92919A Unspecified fracture of unspecified toe(s), initial encounter for closed fracture: Secondary | ICD-10-CM

## 2012-01-17 DIAGNOSIS — I5031 Acute diastolic (congestive) heart failure: Secondary | ICD-10-CM

## 2012-01-17 DIAGNOSIS — S92912A Unspecified fracture of left toe(s), initial encounter for closed fracture: Secondary | ICD-10-CM

## 2012-01-17 MED ORDER — GLIMEPIRIDE 1 MG PO TABS: 1.0000 mg | ORAL_TABLET | Freq: Every day | ORAL | Status: DC

## 2012-01-17 MED ORDER — INSULIN GLARGINE 100 UNIT/ML ~~LOC~~ SOLN: SUBCUTANEOUS | Status: DC

## 2012-01-17 MED ORDER — OXYCODONE HCL 10 MG PO TABS: 5.0000 mg | ORAL_TABLET | Freq: Three times a day (TID) | ORAL | Status: DC | PRN

## 2012-01-17 NOTE — Progress Notes (Signed)
Subjective:    [name], female    DOB: 02-08-1966, 46 y.o.   MRN: [id number]  HPI Needs to discuss pain meds - broke toe 4/17 - seen in UC for same Needs more than usual pain meds due to same - will see ortho Lajoyce Corners) tomorrow for same  Increased dyspnea on exertion in past 3 days - no edema or cough  also reviewed chronic medical issues:  chronic hypoxic resp failure - home O2 dep since 2010 - overlap with OSA/OHS and obst dz per pulm - BiPAP qhs - reports compliance with ongoing medical treatment and home O2 and no changes in medication dose or frequency. denies adverse side effects related to current therapy. Quit smoking 08/2010!!  HTN - reports compliance with ongoing medical treatment and no changes in medication dose or frequency. denies adverse side effects related to current therapy - prior rash from dilt resolved with change back to amlodipine  dCHF, chronic - IP echo 01/2011 reviewed: normal LVEF - reports compliance with ongoing medical treatment. denies adverse side effects related to current therapy. improved edema but increased dyspnea on exertion  - continues bid diuretic - eval by cards for same 02/2011 - to limit fluids <2L/d  morbid obesity - weights reviewed - often has fluid related weight variance but remains obese  Anxiety/depression - chronic hx same with daily BZ use - changed from klonopin to xanax 07/2010 due to "too sleepy" - prev on sertraline, would like to resume same  GERD - reports compliance with ongoing medical treatment and no changes in medication dose or frequency. denies adverse side effects related to current therapy.   dyslipidemia -started on simva spring 2011- reports intermittent compliance with ongoing medical treatment and no changes in medication dose or frequency. denies adverse side effects related to current therapy.   fibromyalgia - chronic pain - on daily narcotics for years - would like to reduce dose if possible (reports  on "10mg " while in [place] - now on "5" - but note prev hydrocod, now oxy) - pain affects chest, back, legs and "whole body" - resumed flexeril  DM2, mild - dx clarified 3/13 - check cbgs BID - AM fasting <150, none over 200 since going home - started metformin and amaryl but stopped after 3 days due to severe diarrhea - now on Lantus, no hypoglycemia   Past Medical History  Diagnosis Date  . Sleep apnea     noncompliant w/ CPAP  . Hypertension   . Hyperlipidemia   . Chronic headache   . Fibromyalgia     daily narcotics  . Anxiety     hx chronic BZ use, stopped 07/2010  . Anemia   . Pulmonary sarcoidosis     unimpressive CT chest 2011  . Colonic polyp   . GERD (gastroesophageal reflux disease)   . ALLERGIC RHINITIS   . Asthma   . CHF (congestive heart failure)     Diastolic with fluid overload, May, 2012, LVEF 60%  . Morbid obesity   . Depression   . Panic attacks   . Diabetes mellitus   . COPD (chronic obstructive pulmonary disease)     on home O2, moderate airflow obstruction, suspect d/t emphysema  . Obesity     Review of Systems  Constitutional: Positive for fatigue. Negative for fever and chills.  Respiratory: Positive for shortness of breath. Negative for cough and wheezing.        Objective:   Physical Exam BP 120/62  Pulse  101  Temp(Src) 98.2 F (36.8 C) (Oral)  Ht 5\' 6"  (1.676 m)  Wt 313 lb (141.976 kg)  BMI 50.52 kg/m2  SpO2 91%  LMP 01/28/2011 Wt Readings from Last 3 Encounters:  01/17/12 313 lb (141.976 kg)  12/27/11 311 lb (141.069 kg)  12/23/11 303 lb 4.8 oz (137.576 kg)   Constitutional: She appears well-developed and well-nourished. Morbidly obese.  Cardiovascular: Normal rate, regular rhythm and normal heart sounds.  BLE with trace edema Pulmonary/Chest: Breath sounds diminished bilateral bases but no respiratory distress. She has no wheezes.  MSkel: 4th and 3rd toes buddy taped - no bruising or gross deformity Neurological: She is alert and  oriented to person, place, and time. Coordination normal.  Psychiatric: She has a normal mood and affect. Her behavior is normal.   Lab Results  Component Value Date   WBC 7.2 12/23/2011   HGB 12.6 12/23/2011   HCT 38.4 12/23/2011   PLT 173 12/23/2011   GLUCOSE 107* 12/23/2011   CHOL  Value: 189               03/07/2010   TRIG 89 03/07/2010   HDL 79 03/07/2010   LDLCALC  Value: 92       03/07/2010   ALT 41* 12/22/2011   AST 38* 12/22/2011   NA 136 12/23/2011   K 4.0 12/23/2011   CL 96 12/23/2011   CREATININE 0.80 12/23/2011   BUN 7 12/23/2011   CO2 28 12/23/2011   TSH 3.755 12/11/2011   HGBA1C 7.4* 12/22/2011   Dg Foot Complete Left  01/10/2012  *RADIOLOGY REPORT*  Clinical Data: Blow to the foot.  Pain in the fourth toe.  LEFT FOOT - COMPLETE 3+ VIEW  Comparison: None.  Findings: The patient has a nondisplaced fracture through the diaphysis of the proximal phalanx of the fourth toe.  No other acute bony or joint abnormality is identified.  IMPRESSION: Nondisplaced fracture proximal diaphysis the fourth toe.  Original Report Authenticated By: Bernadene Bell. Maricela Curet, M.D.     Assessment & Plan:   See problem list. Medications and labs reviewed today.

## 2012-01-17 NOTE — Patient Instructions (Addendum)
It was good to see you today. We have reviewed your prior records including hospital and ER labs and tests today - Continue to monitor your weight and call if increased more than 3 pounds in 24 hours Continue to monitor sodium in your diet - keep to low sodium!!  Increase Demadex to 3x/day until you see Dr. Shelle Iron as discussed to decrease weight for improved breathing Increase Oxy to 10mg  3x/day for next 4-6 weeks (until toe pain is less) Other Medications reviewed, no additional changes at this time. Refill on medication(s) as discussed today. Please schedule followup in 3 months for diabetes mellitus check, call sooner if problems.

## 2012-01-17 NOTE — Assessment & Plan Note (Signed)
01/10/12 - accidental trauma to 4th L toe -  Pending eval by ortho Lajoyce Corners this week - continue buddy tape and dutch shoe Ok to increase oxy to 10mg  TID (usual=bid) for 4-6 weeks because of injury - new rx provided

## 2012-01-17 NOTE — Assessment & Plan Note (Signed)
Acute exac 10/2011- treated with increase diuretic x 4 days by pulm -  now s/p IVF for "dehydration with diarrhea" 3/ 2013 and subsequent increase in weight Unchanged with increase Demadex x 72h -  Will increase to TID pending eval by pulm in 6 days - pt understands and agrees to same Pt to continue monitor sodium intake and monitor weights at home - call if increase weight >3#/day

## 2012-01-22 ENCOUNTER — Telehealth: Payer: Self-pay

## 2012-01-22 NOTE — Telephone Encounter (Signed)
ok 

## 2012-01-22 NOTE — Telephone Encounter (Signed)
HHPT advised

## 2012-01-22 NOTE — Telephone Encounter (Signed)
HHPT called requesting verbal orders for diabetic teaching. Okay to authorize?

## 2012-01-23 ENCOUNTER — Ambulatory Visit (INDEPENDENT_AMBULATORY_CARE_PROVIDER_SITE_OTHER): Payer: Medicare Other | Admitting: Pulmonary Disease

## 2012-01-23 ENCOUNTER — Encounter: Payer: Self-pay | Admitting: Pulmonary Disease

## 2012-01-23 VITALS — BP 110/64 | HR 107 | Temp 97.7°F | Ht 69.0 in | Wt 312.0 lb

## 2012-01-23 DIAGNOSIS — J9610 Chronic respiratory failure, unspecified whether with hypoxia or hypercapnia: Secondary | ICD-10-CM

## 2012-01-23 DIAGNOSIS — E662 Morbid (severe) obesity with alveolar hypoventilation: Secondary | ICD-10-CM

## 2012-01-23 DIAGNOSIS — J449 Chronic obstructive pulmonary disease, unspecified: Secondary | ICD-10-CM

## 2012-01-23 NOTE — Assessment & Plan Note (Signed)
The patient is doing well from a COPD standpoint.  She has not had a recent acute exacerbation, and appears to be stable on her current bronchodilator regimen.

## 2012-01-23 NOTE — Patient Instructions (Signed)
No change in breathing medications Watch salt in diet, keep a close eye on fluid  Stay on bipap machine, work on weight loss Ok for you to fly to [place] as long as you wear oxygen and you are not sick.  Will fill out form if you bring to me. followup with me in 4mos.

## 2012-01-23 NOTE — Progress Notes (Signed)
  Subjective:    [name], female    DOB: 1966-08-16, 46 y.o.   MRN: [id number]  HPI The patient comes in today for followup of her known chronic respiratory failure secondary to obesity hypoventilation syndrome, diastolic heart failure, and moderate COPD.  She has been doing fairly well since the last visit from a breathing standpoint, although she did need a boost in her diuretics by her primary care physician for increasing lower extremity edema.  She feels this has helped her breathing significantly.  She denies any significant chest congestion or cough, and has not required significant rescue inhaler use.  She has been staying on her bilevel compliantly, and is having no issues with the mask or pressure.   Review of Systems  Constitutional: Negative for fever and unexpected weight change.  HENT: Positive for ear pain and postnasal drip. Negative for nosebleeds, congestion, sore throat, rhinorrhea, sneezing, trouble swallowing, dental problem and sinus pressure.   Eyes: Negative for redness and itching.  Respiratory: Positive for cough and shortness of breath. Negative for chest tightness and wheezing.   Cardiovascular: Negative for palpitations and leg swelling.  Gastrointestinal: Negative for nausea and vomiting.  Genitourinary: Negative for dysuria.  Musculoskeletal: Negative for joint swelling.  Skin: Negative for rash.  Neurological: Negative for headaches.  Hematological: Bruises/bleeds easily.  Psychiatric/Behavioral: Negative for dysphoric mood. The patient is not nervous/anxious.        Objective:   Physical Exam Morbidly obese female in no acute distress Nose without purulence or discharge noted No skin breakdown or pressure necrosis from the CPAP mask Chest with very faint basilar crackles, adequate air flow, no wheezing Cardiac exam with regular rate and rhythm, 2/6 systolic murmur Lower extremities with mild edema, no cyanosis Alert and oriented, moves  all 4 extremities.       Assessment & Plan:

## 2012-01-23 NOTE — Assessment & Plan Note (Signed)
The patient is maintaining on bilevel compliantly, and feels that it does indeed help her sleep and have she feels the next day.  I've asked her to continue on this, and to keep up with her mask changes and supplies.

## 2012-01-24 ENCOUNTER — Other Ambulatory Visit: Payer: Self-pay | Admitting: *Deleted

## 2012-01-24 NOTE — Telephone Encounter (Signed)
Received PA pt needing Pa on her lantus. [employer] spoke with Llewellyn Park gave all clinical info, per Tamela Oddi will received aprroval status with 48 hrs.... 01/24/12@2 :34pm/LMB

## 2012-01-26 NOTE — Telephone Encounter (Signed)
Received approval back on PA. Notified pt pharmacy spoke with travis. Gave PA status...01/26/12@9 :43am/LMB

## 2012-01-27 ENCOUNTER — Emergency Department (HOSPITAL_COMMUNITY)
Admission: EM | Admit: 2012-01-27 | Discharge: 2012-01-27 | Disposition: A | Discharge status: Home / Self Care | Payer: Medicare Other | Attending: Emergency Medicine | Admitting: Emergency Medicine

## 2012-01-27 ENCOUNTER — Emergency Department (HOSPITAL_COMMUNITY): Payer: Medicare Other

## 2012-01-27 ENCOUNTER — Encounter (HOSPITAL_COMMUNITY): Payer: Self-pay | Admitting: *Deleted

## 2012-01-27 DIAGNOSIS — R112 Nausea with vomiting, unspecified: Secondary | ICD-10-CM | POA: Insufficient documentation

## 2012-01-27 DIAGNOSIS — E119 Type 2 diabetes mellitus without complications: Secondary | ICD-10-CM | POA: Insufficient documentation

## 2012-01-27 DIAGNOSIS — J449 Chronic obstructive pulmonary disease, unspecified: Secondary | ICD-10-CM | POA: Insufficient documentation

## 2012-01-27 DIAGNOSIS — J4489 Other specified chronic obstructive pulmonary disease: Secondary | ICD-10-CM | POA: Insufficient documentation

## 2012-01-27 DIAGNOSIS — R079 Chest pain, unspecified: Secondary | ICD-10-CM | POA: Insufficient documentation

## 2012-01-27 DIAGNOSIS — Z79899 Other long term (current) drug therapy: Secondary | ICD-10-CM | POA: Insufficient documentation

## 2012-01-27 DIAGNOSIS — I10 Essential (primary) hypertension: Secondary | ICD-10-CM | POA: Insufficient documentation

## 2012-01-27 DIAGNOSIS — I509 Heart failure, unspecified: Secondary | ICD-10-CM | POA: Insufficient documentation

## 2012-01-27 DIAGNOSIS — E876 Hypokalemia: Secondary | ICD-10-CM

## 2012-01-27 LAB — BASIC METABOLIC PANEL
CO2: 29 mEq/L (ref 19–32)
Chloride: 93 mEq/L — ABNORMAL LOW (ref 96–112)
GFR calc Af Amer: >90 mL/min (ref >90)
Potassium: 2.8 mEq/L — ABNORMAL LOW (ref 3.5–5.1)

## 2012-01-27 LAB — CBC
HCT: 42.4 % (ref 36.0–46.0)
Hemoglobin: 14.5 g/dL (ref 12.0–15.0)
MCHC: 34.2 g/dL (ref 30.0–36.0)
RDW: 15.5 % (ref 11.5–15.5)
WBC: 6.2 10*3/uL (ref 4.0–10.5)

## 2012-01-27 LAB — DIFFERENTIAL
Basophils Absolute: 0.1 10*3/uL (ref 0.0–0.1)
Basophils Relative: 1 % (ref 0–1)
Lymphocytes Relative: 19 % (ref 12–46)
Monocytes Absolute: 0.6 10*3/uL (ref 0.1–1.0)
Neutro Abs: 4 10*3/uL (ref 1.7–7.7)
Neutrophils Relative %: 64 % (ref 43–77)

## 2012-01-27 LAB — GLUCOSE, CAPILLARY
Glucose-Capillary: 281 mg/dL — ABNORMAL HIGH (ref 70–99)
Glucose-Capillary: 84 mg/dL (ref 70–99)

## 2012-01-27 LAB — POCT I-STAT TROPONIN I: Troponin i, poc: 0.01 ng/mL (ref 0.00–0.08)

## 2012-01-27 LAB — POTASSIUM: Potassium: 3 mEq/L — ABNORMAL LOW (ref 3.5–5.1)

## 2012-01-27 MED ORDER — POTASSIUM CHLORIDE CRYS ER 20 MEQ PO TBCR
40.0000 meq | EXTENDED_RELEASE_TABLET | Freq: Once | ORAL | Status: AC
  Administered 2012-01-27: 40 meq via ORAL
  Filled 2012-01-27: qty 2

## 2012-01-27 MED ORDER — POTASSIUM CHLORIDE 10 MEQ/100ML IV SOLN
10.0000 meq | Freq: Once | INTRAVENOUS | Status: AC
  Administered 2012-01-27 (×2): 10 meq via INTRAVENOUS
  Filled 2012-01-27: qty 100

## 2012-01-27 MED ORDER — ONDANSETRON 8 MG PO TBDP: 8.0000 mg | ORAL_TABLET | Freq: Three times a day (TID) | ORAL | Status: AC | PRN

## 2012-01-27 MED ORDER — POTASSIUM CHLORIDE 10 MEQ/100ML IV SOLN: 10.0000 meq | Freq: Once | INTRAVENOUS | Status: DC

## 2012-01-27 NOTE — ED Notes (Signed)
CBG- [id number] 

## 2012-01-27 NOTE — Discharge Instructions (Signed)
Read instructions below for reasons to return to the Emergency Department. It is recommended that your follow up with your Primary Care Doctor in regards to today's visit. If you do not have a doctor, use the resource guide listed below to help you find one. Take Zofran as needed for nausea and vomiting.  Chest Pain (Nonspecific)  HOME CARE INSTRUCTIONS  For the next few days, avoid physical activities that bring on chest pain. Continue physical activities as directed.  Do not smoke cigarettes or drink alcohol until your symptoms are gone.  Only take over-the-counter or prescription medicine for pain, discomfort, or fever as directed by your caregiver.  Follow your caregiver's suggestions for further testing if your chest pain does not go away.  Keep any follow-up appointments you made. If you do not go to an appointment, you could develop lasting (chronic) problems with pain. If there is any problem keeping an appointment, you must call to reschedule.  SEEK MEDICAL CARE IF:  You think you are having problems from the medicine you are taking. Read your medicine instructions carefully.  Your chest pain does not go away, even after treatment.  You develop a rash with blisters on your chest.  SEEK IMMEDIATE MEDICAL CARE IF:  You have increased chest pain or pain that spreads to your arm, neck, jaw, back, or belly (abdomen).  You develop shortness of breath, an increasing cough, or you are coughing up blood.  You have severe back or abdominal pain, feel sick to your stomach (nauseous) or throw up (vomit).  You develop severe weakness, fainting, or chills.  You have an oral temperature above 102 F (38.9 C), not controlled by medicine.   THIS IS AN EMERGENCY. Do not wait to see if the pain will go away. Get medical help at once. Call your local emergency services (911 in [place] Do not drive yourself to the hospital.   RESOURCE GUIDE  Dental Problems  Patients with Medicaid: [facility] [phone] [address]                                           [phone] [address] Phone:  [phone]                                                  Phone:  [phone]  If unable to pay or uninsured, contact:  [facility] or [facility]. to become qualified for the adult dental clinic.  Chronic Pain Problems Contact [facility]  [phone] Patients need to be referred by their primary care doctor.  Insufficient Money for Medicine Contact [employer]:  call "211" or [facility] [phone].  No Primary Care Doctor Call [facility]  [phone] Other agencies that provide inexpensive medical care    [facility]  [phone]    [facility]  [phone]    [facility]  [phone]    [facility]  [phone]    [facility]  6262330078    [facility]  [phone]  Psychological Services [facility]  [phone] [facility]  [phone] [facility]   [phone] (emergency services ([phone])  Substance Abuse Resources [facility]  [phone] [facility] [phone] [facility] [place] [phone] Floydene Flock [phone] Residential & Outpatient Substance Abuse Program  ([phone]  Abuse/Neglect [facility] Child Abuse Hotline 6801149464 [facility] Child Abuse Hotline (414) 432-2339 (After Hours)  Emergency Shelter [facility] [phone]  Maternity Homes Room at the [place] [facility] [place] [phone] [facility] [phone]  MRSA Hotline #:   567-008-8114    [facility] Resources  [facility] [place]     [employer]                          [facility] Dept. [address] [place]                       [address]      [address]  Blondell Reveal Phone:  [phone]                                   Phone:  [phone]                 Phone:  [phone]  [facility] Mental Health Phone:  [phone]  [facility] Child Abuse Hotline (928) [phone] (After Hours)

## 2012-01-27 NOTE — ED Notes (Signed)
Report given to [place], Charity fundraiser. Pt to be moved to CDU.
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
## 2012-01-27 NOTE — ED Provider Notes (Signed)
  Physical Exam  BP 109/67  Pulse 98  Temp(Src) 98 F (36.7 C) (Oral)  Resp 18  SpO2 94%  LMP 12/28/2011  Physical Exam  ED Course  Procedures Patient will be discharged and asked to follow up with her primary doctor. Told to continue her potassium and add 2 bananas a day.      Carlyle Dolly, PA-C 01/27/12 2007

## 2012-01-27 NOTE — ED Provider Notes (Signed)
History     CSN: [id number]  Arrival date & time 01/27/12  1309   First MD Initiated Contact with Patient 01/27/12 1339      Chief Complaint  Patient presents with  . Chest Pain  . Emesis    (Consider location/radiation/quality/duration/timing/severity/associated sxs/prior treatment) HPI Comments: Patient comes in today with a chief complaint of chest pain.  Chest pain began last evening and was located of the left anterior chest.  She reports that the pain radiated to her left arm.  Pain associated with nausea, vomiting x 2, and some dizziness.  She reports that the pain lasted approximately 20-30 minutes and then resolved without intervention.  She then began having pain again this morning.  She did take a daily aspirin today.  PMH significant for HTN, DM, hyperlipidemia, COPD, and CHF.  No prior cardiac stress test or cardiac catheterization.  Her Cardiologist is Dr. Myrtis Ser.   She also reports that she was recently diagnosed with DM.  She is currently on Lantus 16 Units at bedtime.   She has not had any previous DVT/PE, no surgeries or prolonged travel in the past 4 weeks, no lower extremity swelling or pain.  Patient is not on any estrogen containing medications.    Patient is a 46 y.o. female presenting with chest pain and vomiting. The history is provided by the patient.  Chest Pain Primary symptoms include shortness of breath, cough, nausea, vomiting and dizziness. Pertinent negatives for primary symptoms include no fever, no wheezing and no abdominal pain.  Dizziness also occurs with nausea and vomiting. Dizziness does not occur with weakness.  Pertinent negatives for associated symptoms include no numbness and no weakness.    Emesis  Associated symptoms include cough. Pertinent negatives include no abdominal pain, no chills and no fever.    Past Medical History  Diagnosis Date  . Sleep apnea     noncompliant w/ CPAP  . Hypertension   . Hyperlipidemia   . Chronic headache     . Fibromyalgia     daily narcotics  . Anxiety     hx chronic BZ use, stopped 07/2010  . Anemia   . Pulmonary sarcoidosis     unimpressive CT chest 2011  . Colonic polyp   . GERD (gastroesophageal reflux disease)   . ALLERGIC RHINITIS   . Asthma   . CHF (congestive heart failure)     Diastolic with fluid overload, May, 2012, LVEF 60%  . Morbid obesity   . Depression   . Panic attacks   . Diabetes mellitus   . COPD (chronic obstructive pulmonary disease)     on home O2, moderate airflow obstruction, suspect d/t emphysema  . Obesity     Past Surgical History  Procedure Date  . Polypectomy 2011  . Lumbar microdiscectomy 07/06/2011    R L4-5, stern  . Back surgery     Family History  Problem Relation Age of Onset  . Hypertension Mother   . Emphysema Father   . Hypertension Father   . Stomach cancer Father   . Allergies Brother   . Hypertension Brother   . Stomach cancer Brother   . Heart disease Mother   . Heart disease Father   . Heart disease Brother     History  Substance Use Topics  . Smoking status: Former Smoker -- 1.0 packs/day for 29 years    Types: Cigarettes    Quit date: 09/14/2010  . Smokeless tobacco: Never Used  . Alcohol Use: No  OB History    Grav Para Term Preterm Abortions TAB SAB Ect Mult Living                  Review of Systems  Constitutional: Negative for fever and chills.  HENT: Negative for neck pain and neck stiffness.   Eyes: Negative for visual disturbance.  Respiratory: Positive for cough and shortness of breath. Negative for wheezing.   Cardiovascular: Positive for chest pain.  Gastrointestinal: Positive for nausea and vomiting. Negative for abdominal pain.  Skin: Negative for rash.  Neurological: Positive for dizziness and light-headedness. Negative for syncope, weakness and numbness.    Allergies  Ciprofloxacin; Latex; Sulfamethoxazole w-trimethoprim; Azithromycin; and Doxycycline  Home Medications   Current  Outpatient Rx  Name Route Sig Dispense Refill  . ALBUTEROL SULFATE HFA 108 (90 BASE) MCG/ACT IN AERS Inhalation Inhale 2 puffs into the lungs every 6 (six) hours as needed. For wheeze or shortness of breath 18 g 2  . ALBUTEROL SULFATE (2.5 MG/3ML) 0.083% IN NEBU Nebulization Take 3 mLs (2.5 mg total) by nebulization every 6 (six) hours as needed. For wheeze or shortness of breath 75 mL 2  . ALPRAZOLAM 1 MG PO TABS Oral Take 1 mg by mouth 2 (two) times daily as needed. For anxiety    . AMLODIPINE BESYLATE 5 MG PO TABS Oral Take 5 mg by mouth daily.    . ASPIRIN 81 MG PO TBEC Oral Take 81 mg by mouth daily.    . BUDESONIDE-FORMOTEROL FUMARATE 160-4.5 MCG/ACT IN AERO Inhalation Inhale 2 puffs into the lungs 2 (two) times daily.    Marland Kitchen CETIRIZINE HCL 10 MG PO TABS Oral Take 10 mg by mouth at bedtime.    Marland Kitchen ESOMEPRAZOLE MAGNESIUM 40 MG PO CPDR Oral Take 40 mg by mouth daily before breakfast.    . GLIMEPIRIDE 1 MG PO TABS Oral Take 1 tablet (1 mg total) by mouth daily before breakfast. 30 tablet 5  . INSULIN GLARGINE 100 UNIT/ML [place] SOLN Subcutaneous Inject 16 Units into the skin at bedtime.    . MELOXICAM 15 MG PO TABS Oral Take 1 tablet (15 mg total) by mouth daily. 14 tablet 0  . OXYCODONE HCL 10 MG PO TABS Oral Take 5-10 mg by mouth 3 (three) times daily as needed. For pain    . POTASSIUM CHLORIDE CRYS ER 20 MEQ PO TBCR Oral Take 20 mEq by mouth 2 (two) times daily.     Marland Kitchen PROMETHAZINE HCL 25 MG PO TABS Oral Take 25 mg by mouth every 6 (six) hours as needed. For nausea    . SERTRALINE HCL 25 MG PO TABS Oral Take 25 mg by mouth daily.     Marland Kitchen SPIRONOLACTONE 25 MG PO TABS  Take 1 by mouth daily    . TIOTROPIUM BROMIDE MONOHYDRATE 18 MCG IN CAPS Inhalation Place 1 capsule (18 mcg total) into inhaler and inhale daily. 30 capsule 5  . TORSEMIDE 100 MG PO TABS Oral Take 1 tablet (100 mg total) by mouth 3 (three) times daily. 60 tablet 2    BP 119/63  Pulse 99  Temp(Src) 98 F (36.7 C) (Oral)  Resp 19   SpO2 94%  LMP 01/28/2011  Physical Exam  Nursing note and vitals reviewed. Constitutional: She appears well-developed and well-nourished. No distress.  HENT:  Head: Normocephalic and atraumatic.  Mouth/Throat: Oropharynx is clear and moist.  Eyes: EOM are normal. Pupils are equal, round, and reactive to light.  Neck: Normal range of motion.  Neck supple.  Cardiovascular: Normal rate, regular rhythm, normal heart sounds and intact distal pulses.        No peripheral edema  Pulmonary/Chest: Effort normal. No respiratory distress. She has wheezes. She has no rales. She exhibits no tenderness.       Expiratory wheeze RLL  Abdominal: Soft. Bowel sounds are normal. She exhibits no distension and no mass. There is no tenderness. There is no rebound and no guarding.  Neurological: She is alert.  Skin: Skin is warm and dry. She is not diaphoretic.  Psychiatric: She has a normal mood and affect.    ED Course  Procedures (including critical care time)  Labs Reviewed  GLUCOSE, CAPILLARY - Abnormal; Notable for the following:    Glucose-Capillary 281 (*)    All other components within normal limits  CBC  DIFFERENTIAL  BASIC METABOLIC PANEL   No results found.   No diagnosis found.   Date: 01/27/2012  Rate: 97  Rhythm: normal sinus rhythm  QRS Axis: normal  Intervals: normal  ST/T Wave abnormalities: nonspecific T wave changes  Conduction Disutrbances:none  Narrative Interpretation:  T wave inversion in leads V1 and V2  Old EKG Reviewed: unchanged  4:42 PM Patient discussed with Ebbie Ridge, PA-C in the CDU.  Plan is for potassium to be repleted and for patient to be discharged home. MDM  Patient comes in today with a chief complaint of chest pain and left arm pain.  No acute changes on EKG.  Negative troponin.  CXR does not show any acute abnormalities.  Patient evaluated by Dr. Daleen Squibb with Cardiology.  He does not fell that patient's chest pain is cardiac.  He feels that patient  can be discharged home from a cardiac stand point.  Patient hypokalemic with a Potassium of 2.8.  Patient ordered potassium repletion and will be transferred to CDU for K repletion and recheck of potassium.        Pascal Lux [place], PA-C 01/28/12 2109

## 2012-01-27 NOTE — ED Notes (Signed)
CDU full at the time. Nurse to call back.

## 2012-01-27 NOTE — ED Notes (Signed)
Pt states understanding of discharge instructions 

## 2012-01-27 NOTE — ED Notes (Signed)
To ED for eval of cp, sob, and vomiting since last night. Glucose at home for the past 2 days have been in the 200's. Pt is a new diabetic.

## 2012-01-28 NOTE — ED Provider Notes (Signed)
Medical screening examination/treatment/procedure(s) were conducted as a shared visit with non-physician practitioner(s) and myself.  I personally evaluated the patient during the encounter   Gwyneth Sprout, MD 01/28/12 [phone]

## 2012-01-29 ENCOUNTER — Telehealth: Payer: Self-pay

## 2012-01-29 NOTE — Telephone Encounter (Signed)
[facility] Triage Call Report Triage Record Num: [id number] Operator: Caswell Corwin Patient Name: [name] Call Date & Time: 01/27/2012 12:27:44PM Patient Phone: [phone] PCP: Rene Paci Patient Gender: Female PCP Fax : [phone] Patient DOB: 05-03-1966 Practice Name: [facility] Reason for Call: Caller: [name]/Patient; PCP: Rene Paci; CB#: [phone]; Call regarding Nausea and Vomiting, Headache, Sugar Is 247 @ 1125. Is not on SS insulin. Sx started after she ate breakfast this AM. Triaged DM GI problems and last voided at 1120. Bg is 247, but pt having abd pain, vomitng, headache and increased thirst. Disp = needs to be seen in the E/R for eval. Protocol(s) Used: Diabetes: Gastrointestinal Problems Recommended Outcome per Protocol: See ED Immediately Reason for Outcome: Blood sugar greater than 300 mg% AND any abdominal pain (generalized or localized) Care Advice: ~ Another adult should drive. 01/27/2012 12:36:49PM Page 1 of 1 [email or web address]

## 2012-01-29 NOTE — ED Provider Notes (Signed)
Medical screening examination/treatment/procedure(s) were conducted as a shared visit with non-physician practitioner(s) and myself.  I personally evaluated the patient during the encounter  Pt with chest pain that is atypical.  Pain free now.  Pt was seen by cardiology and cleared for follow.  She is hypokalemic here and will replete  Gwyneth Sprout, MD 01/29/12 2138

## 2012-01-29 NOTE — Telephone Encounter (Signed)
Noted thanks °

## 2012-02-13 ENCOUNTER — Encounter: Payer: Self-pay | Admitting: Internal Medicine

## 2012-02-13 ENCOUNTER — Other Ambulatory Visit (INDEPENDENT_AMBULATORY_CARE_PROVIDER_SITE_OTHER): Payer: Medicare Other

## 2012-02-13 ENCOUNTER — Ambulatory Visit (INDEPENDENT_AMBULATORY_CARE_PROVIDER_SITE_OTHER): Payer: Medicare Other | Admitting: Internal Medicine

## 2012-02-13 ENCOUNTER — Telehealth: Payer: Self-pay | Admitting: Pulmonary Disease

## 2012-02-13 VITALS — BP 120/72 | HR 94 | Temp 98.1°F | Ht 69.0 in | Wt 299.1 lb

## 2012-02-13 DIAGNOSIS — I509 Heart failure, unspecified: Secondary | ICD-10-CM

## 2012-02-13 DIAGNOSIS — E785 Hyperlipidemia, unspecified: Secondary | ICD-10-CM

## 2012-02-13 DIAGNOSIS — E1165 Type 2 diabetes mellitus with hyperglycemia: Secondary | ICD-10-CM

## 2012-02-13 DIAGNOSIS — J309 Allergic rhinitis, unspecified: Secondary | ICD-10-CM

## 2012-02-13 DIAGNOSIS — I5032 Chronic diastolic (congestive) heart failure: Secondary | ICD-10-CM

## 2012-02-13 LAB — LIPID PANEL: HDL: 54.3 mg/dL (ref >39.00)

## 2012-02-13 LAB — LDL CHOLESTEROL, DIRECT: Direct LDL: 186.4 mg/dL

## 2012-02-13 MED ORDER — TORSEMIDE 100 MG PO TABS: 100.0000 mg | ORAL_TABLET | Freq: Two times a day (BID) | ORAL | Status: DC

## 2012-02-13 MED ORDER — INSULIN GLARGINE 100 UNIT/ML ~~LOC~~ SOLN: 16.0000 [IU] | Freq: Every day | SUBCUTANEOUS | Status: DC

## 2012-02-13 MED ORDER — MOMETASONE FUROATE 50 MCG/ACT NA SUSP: 2.0000 | Freq: Every day | NASAL | Status: DC

## 2012-02-13 MED ORDER — "PEN NEEDLES 5/16"" 31G X 8 MM MISC": Status: DC

## 2012-02-13 MED ORDER — TORSEMIDE 100 MG PO TABS: 100.0000 mg | ORAL_TABLET | Freq: Three times a day (TID) | ORAL | Status: DC

## 2012-02-13 MED ORDER — OXYCODONE HCL 10 MG PO TABS: 5.0000 mg | ORAL_TABLET | Freq: Three times a day (TID) | ORAL | Status: DC | PRN

## 2012-02-13 NOTE — Assessment & Plan Note (Signed)
Remote simva poorly tolerated (leg cramps), not on meds in past few years Check lipids now

## 2012-02-13 NOTE — Telephone Encounter (Signed)
Form placed in [facility] VIP folder.

## 2012-02-13 NOTE — Patient Instructions (Signed)
It was good to see you today. Test(s) ordered today. Your results will be called to you after review (48-72hours after test completion). If any changes need to be made, you will be notified at that time. Start Nasonex for allergy symptoms in addition to Zyrtec - other Medications reviewed, no changes at this time. Refill on medication(s) as discussed today. Your prescription(s) have been submitted to your pharmacy. Please take as directed and contact our office if you believe you are having problem(s) with the medication(s). Please schedule followup in 3-4 months, call sooner if problems.

## 2012-02-13 NOTE — Assessment & Plan Note (Signed)
New dx, clarified 11/2011 -  Intol of metformin due to severe diarrhea On Lantus pen qhs and Amaryl in AM - titrate as needed Continue with plans for DM education and home cbgs recheck a1c in 3 mo, sooner if problems Lab Results  Component Value Date   HGBA1C 7.4* 12/22/2011

## 2012-02-13 NOTE — Assessment & Plan Note (Signed)
Acute exac 10/2011 and 12/2011- treated with increase diuretic x 4 days  monitor sodium intake and monitor weights at home - call if increase weight >3#/day

## 2012-02-13 NOTE — Assessment & Plan Note (Signed)
On Zyrtec Add nasal steroid - erx done

## 2012-02-13 NOTE — Progress Notes (Signed)
Subjective:    [name], female    DOB: March 06, 1966, 46 y.o.   MRN: [id number]  HPI here for follow up   complains of numb L index finger x 1 month Also complains of allergic rhinitis and sore throat from drainage, no fever, using zyrtec  also reviewed chronic medical issues:  chronic hypoxic resp failure - home O2 dep since 2010 - overlap with OSA/OHS and obst dz per pulm - BiPAP qhs - reports compliance with ongoing medical treatment and home O2 and no changes in medication dose or frequency. denies adverse side effects related to current therapy. Quit smoking 08/2010!!  HTN - reports compliance with ongoing medical treatment and no changes in medication dose or frequency. denies adverse side effects related to current therapy - prior rash from dilt resolved with change back to amlodipine  dCHF, chronic - IP echo 01/2011 reviewed: normal LVEF - reports compliance with ongoing medical treatment. denies adverse side effects related to current therapy. improved edema but increased dyspnea on exertion  - continues bid diuretic - eval by cards for same 02/2011 - to limit fluids <2L/d  morbid obesity - weights reviewed - often has fluid related weight variance but remains obese  Anxiety/depression - chronic hx same with daily BZ use - changed from klonopin to xanax 07/2010 due to "too sleepy" - prev on sertraline, would like to resume same  GERD - reports compliance with ongoing medical treatment and no changes in medication dose or frequency. denies adverse side effects related to current therapy.   dyslipidemia -started on simva spring 2011- reports intermittent compliance with ongoing medical treatment and no changes in medication dose or frequency. denies adverse side effects related to current therapy.   fibromyalgia - chronic pain - on daily narcotics for years - would like to reduce dose if possible (reports on "10mg " while in [place] - now on "5" - but note prev hydrocod, now  oxy) - pain affects chest, back, legs and "whole body" - resumed flexeril  DM2, mild - dx clarified 3/13 - check cbgs BID - AM fasting <150, none over 200 since going home - started metformin and amaryl but stopped after 3 days due to severe diarrhea - now on Lantus pen, no hypoglycemia   Past Medical History  Diagnosis Date  . Sleep apnea     noncompliant w/ CPAP  . Hypertension   . Hyperlipidemia   . Chronic headache   . Fibromyalgia     daily narcotics  . Anxiety     hx chronic BZ use, stopped 07/2010  . Anemia   . Pulmonary sarcoidosis     unimpressive CT chest 2011  . Colonic polyp   . GERD (gastroesophageal reflux disease)   . ALLERGIC RHINITIS   . Asthma   . CHF (congestive heart failure)     Diastolic with fluid overload, May, 2012, LVEF 60%  . Morbid obesity   . Depression   . Panic attacks   . Diabetes mellitus   . COPD (chronic obstructive pulmonary disease)     on home O2, moderate airflow obstruction, suspect d/t emphysema  . Obesity     Review of Systems  Constitutional: Positive for fatigue. Negative for fever and chills.  Respiratory: Positive for shortness of breath. Negative for cough and wheezing.        Objective:   Physical Exam  BP 120/72  Pulse 94  Temp(Src) 98.1 F (36.7 C) (Oral)  Ht 5\' 9"  (1.753 m)  Wt 299 lb 1.9 oz (135.68 kg)  BMI 44.17 kg/m2  SpO2 90%  LMP 12/28/2011 Wt Readings from Last 3 Encounters:  02/13/12 299 lb 1.9 oz (135.68 kg)  01/23/12 312 lb (141.522 kg)  01/17/12 313 lb (141.976 kg)   Constitutional: She appears well-developed and well-nourished. Morbidly obese.  Cardiovascular: Normal rate, regular rhythm and normal heart sounds.  BLE with trace edema Pulmonary/Chest: Breath sounds diminished bilateral bases but no respiratory distress. She has no wheezes.  MSkel - no deformity L index finger - no swelling, good cap refill, FROM and good grip strength Neurological: She is alert and oriented to person, place, and  time. Coordination normal.  Psychiatric: She has a normal mood and affect. Her behavior is normal.   Lab Results  Component Value Date   WBC 6.2 01/27/2012   HGB 14.5 01/27/2012   HCT 42.4 01/27/2012   PLT 188 01/27/2012   GLUCOSE 129* 01/27/2012   CHOL  Value: 189        ATP III CLASSIFICATION:  <200     mg/dL   Desirable  540-981  mg/dL   Borderline High  >=191    mg/dL   High        4/78/2956   TRIG 89 03/07/2010   HDL 79 03/07/2010   LDLCALC  Value: 92        Total Cholesterol/HDL:CHD Risk Coronary Heart Disease Risk Table                     Men   Women  1/2 Average Risk   3.4   3.3  Average Risk       5.0   4.4  2 X Average Risk   9.6   7.1  3 X Average Risk  23.4   11.0        Use the calculated Patient Ratio above and the CHD Risk Table to determine the patient's CHD Risk.        ATP III CLASSIFICATION (LDL):  <100     mg/dL   Optimal  213-086  mg/dL   Near or Above                    Optimal  130-159  mg/dL   Borderline  578-469  mg/dL   High  >629     mg/dL   Very High 02/20/4131   ALT 41* 12/22/2011   AST 38* 12/22/2011   NA 136 01/27/2012   K 3.0* 01/27/2012   CL 93* 01/27/2012   CREATININE 0.80 01/27/2012   BUN 9 01/27/2012   CO2 29 01/27/2012   TSH 3.755 12/11/2011   HGBA1C 7.4* 12/22/2011       Assessment & Plan:   See problem list. Medications and labs reviewed today.

## 2012-02-14 ENCOUNTER — Telehealth: Payer: Self-pay | Admitting: *Deleted

## 2012-02-14 MED ORDER — PRAVASTATIN SODIUM 20 MG PO TABS: 20.0000 mg | ORAL_TABLET | Freq: Every day | ORAL | Status: DC

## 2012-02-14 NOTE — Telephone Encounter (Signed)
Notified pt with lab results. MD starting pt on pravastatin 20 mg. Sending rx to [facility]... 02/14/12@3 :21pm/LMB

## 2012-02-15 ENCOUNTER — Telehealth: Payer: Self-pay | Admitting: Internal Medicine

## 2012-02-15 ENCOUNTER — Other Ambulatory Visit: Payer: Self-pay | Admitting: Internal Medicine

## 2012-02-15 DIAGNOSIS — M79673 Pain in unspecified foot: Secondary | ICD-10-CM

## 2012-02-15 NOTE — Telephone Encounter (Signed)
Form completed by Dr. Shelle Iron and pt has been notified. Form faxed back to [facility] at the number the pt provided for the office, original mailed to the pt's home address, which was confirmed with the pt. A copy will be scanned into the pt's chart.

## 2012-02-15 NOTE — Telephone Encounter (Signed)
ok 

## 2012-02-15 NOTE — Telephone Encounter (Signed)
The pt is hoping to get a referral to a podiatrist.  She states she is being have foot problems, but did not elaborate further.  Thanks!

## 2012-02-21 ENCOUNTER — Other Ambulatory Visit: Payer: Self-pay | Admitting: Internal Medicine

## 2012-02-21 NOTE — Telephone Encounter (Signed)
Faxed script back to [facility]... 02/21/12@1 :32pm/lmb

## 2012-03-06 DIAGNOSIS — D869 Sarcoidosis, unspecified: Secondary | ICD-10-CM

## 2012-03-06 DIAGNOSIS — I5032 Chronic diastolic (congestive) heart failure: Secondary | ICD-10-CM

## 2012-03-06 DIAGNOSIS — R269 Unspecified abnormalities of gait and mobility: Secondary | ICD-10-CM

## 2012-03-06 DIAGNOSIS — IMO0001 Reserved for inherently not codable concepts without codable children: Secondary | ICD-10-CM

## 2012-03-13 ENCOUNTER — Other Ambulatory Visit: Payer: Self-pay

## 2012-03-13 MED ORDER — OXYCODONE HCL 10 MG PO TABS: 5.0000 mg | ORAL_TABLET | Freq: Three times a day (TID) | ORAL | Status: DC | PRN

## 2012-03-13 NOTE — Telephone Encounter (Signed)
Pt informed, Rx in cabinet for pt pick up  

## 2012-03-20 ENCOUNTER — Other Ambulatory Visit: Payer: Self-pay

## 2012-03-20 MED ORDER — ALPRAZOLAM 1 MG PO TABS: 1.0000 mg | ORAL_TABLET | Freq: Two times a day (BID) | ORAL | Status: DC | PRN

## 2012-03-20 NOTE — Telephone Encounter (Signed)
MD approve xanax. Called pt no answer LMOM rx fax back to [facility]... 03/20/12@1 :52pm/LMB

## 2012-03-29 ENCOUNTER — Other Ambulatory Visit: Payer: Self-pay | Admitting: *Deleted

## 2012-03-29 MED ORDER — AMLODIPINE BESYLATE 5 MG PO TABS: 5.0000 mg | ORAL_TABLET | Freq: Every day | ORAL | Status: DC

## 2012-03-29 NOTE — Telephone Encounter (Signed)
R'cd fax from [facility] for refill of Amlodipine.  

## 2012-04-05 ENCOUNTER — Ambulatory Visit: Payer: Self-pay | Admitting: Internal Medicine

## 2012-04-10 ENCOUNTER — Ambulatory Visit (INDEPENDENT_AMBULATORY_CARE_PROVIDER_SITE_OTHER): Payer: Medicare Other | Admitting: Internal Medicine

## 2012-04-10 ENCOUNTER — Encounter: Payer: Self-pay | Admitting: Internal Medicine

## 2012-04-10 VITALS — BP 112/68 | HR 94 | Temp 98.5°F | Ht 69.0 in | Wt 287.1 lb

## 2012-04-10 DIAGNOSIS — J019 Acute sinusitis, unspecified: Secondary | ICD-10-CM

## 2012-04-10 DIAGNOSIS — I509 Heart failure, unspecified: Secondary | ICD-10-CM

## 2012-04-10 DIAGNOSIS — IMO0001 Reserved for inherently not codable concepts without codable children: Secondary | ICD-10-CM

## 2012-04-10 DIAGNOSIS — R300 Dysuria: Secondary | ICD-10-CM

## 2012-04-10 MED ORDER — PROMETHAZINE HCL 25 MG PO TABS: 25.0000 mg | ORAL_TABLET | Freq: Four times a day (QID) | ORAL | Status: DC | PRN

## 2012-04-10 MED ORDER — AMOXICILLIN-POT CLAVULANATE 875-125 MG PO TABS: 1.0000 | ORAL_TABLET | Freq: Two times a day (BID) | ORAL | Status: AC

## 2012-04-10 MED ORDER — OXYCODONE HCL 10 MG PO TABS: 5.0000 mg | ORAL_TABLET | Freq: Three times a day (TID) | ORAL | Status: DC | PRN

## 2012-04-10 NOTE — Patient Instructions (Signed)
It was good to see you today. Augmentin antibiotics 2x/day x 10days for sinus/cough - Your prescription(s) have been submitted to your pharmacy. Please take as directed and contact our office if you believe you are having problem(s) with the medication(s). Test(s) ordered today. Your results will be called to you after review (48-72hours after test completion). If any changes need to be made, you will be notified at that time. Refill on medication(s) as discussed today.

## 2012-04-10 NOTE — Progress Notes (Signed)
Subjective:    [name], female    DOB: [birth year], 46 y.o.   MRN: [id number]  HPI here for cough - sinus pressure and PND, ?sinus pressure Also "tender" over L buttock - no trauma, no radiation  also reviewed chronic medical issues:  chronic hypoxic resp failure - home O2 dep since 2010 - overlap with OSA/OHS and obst dz per pulm - BiPAP qhs - reports compliance with ongoing medical treatment and home O2 and no changes in medication dose or frequency. denies adverse side effects related to current therapy. Quit smoking 08/2010!!  hypertension - reports compliance with ongoing medical treatment and no changes in medication dose or frequency. denies adverse side effects related to current therapy - prior rash from dilt resolved with change back to amlodipine  dCHF, chronic - IP echo 01/2011 reviewed: normal LVEF - reports compliance with ongoing medical treatment. denies adverse side effects related to current therapy. improved edema but increased dyspnea on exertion  - continues bid diuretic - eval by cards for same 02/2011 - to limit fluids <2L/d  morbid obesity - weights reviewed - often has fluid related weight variance but remains obese  Anxiety/depression - chronic hx same with daily BZ use - changed from klonopin to xanax 07/2010 due to "too sleepy" - on sertraline - the patient reports compliance with medication(s) as prescribed. Denies adverse side effects.   dyslipidemia -started on simva spring 2011- reports intermittent compliance with ongoing medical treatment and no changes in medication dose or frequency. denies adverse side effects related to current therapy.   fibromyalgia - chronic pain - on daily narcotics for years - pain affects chest, back, legs and "whole body" - resumed flexeril  DM2, mild - dx clarified 3/13 - check cbgs BID - AM fasting <150, none over 200 since going home - started metformin and amaryl but stopped after 3 days due to severe diarrhea -  now on Lantus pen, no hypoglycemia   Past Medical History  Diagnosis Date  . Sleep apnea     noncompliant w/ CPAP  . Hypertension   . Hyperlipidemia   . Chronic headache   . Fibromyalgia     daily narcotics  . Anxiety     hx chronic BZ use, stopped 07/2010  . Anemia   . Pulmonary sarcoidosis     unimpressive CT chest 2011  . Colonic polyp   . GERD (gastroesophageal reflux disease)   . ALLERGIC RHINITIS   . Asthma   . CHF (congestive heart failure)     Diastolic with fluid overload, May, 2012, LVEF 60%  . Morbid obesity   . Depression   . Panic attacks   . Diabetes mellitus   . COPD (chronic obstructive pulmonary disease)     on home O2, moderate airflow obstruction, suspect d/t emphysema  . Obesity     Review of Systems  Constitutional: Positive for fatigue. Negative for fever, chills and unexpected weight change.  HENT: Positive for sinus pressure. Negative for sore throat.   Respiratory: Positive for cough, shortness of breath and wheezing.   Cardiovascular: Negative for chest pain and leg swelling.  Musculoskeletal: Positive for myalgias and back pain. Negative for joint swelling.       Objective:   Physical Exam  BP 112/68  Pulse 94  Temp 98.5 F (36.9 C) (Oral)  Ht 5\' 9"  (1.753 m)  Wt 287 lb 1.9 oz (130.237 kg)  BMI 42.40 kg/m2  SpO2 94% Wt Readings from Last 3  Encounters:  04/10/12 287 lb 1.9 oz (130.237 kg)  02/13/12 299 lb 1.9 oz (135.68 kg)  01/23/12 312 lb (141.522 kg)   Constitutional: She appears well-developed and well-nourished. Morbidly obese.  Cardiovascular: Normal rate, regular rhythm and normal heart sounds.  BLE with trace edema Pulmonary/Chest: Breath sounds diminished bilateral bases but no respiratory distress. She has no wheezes.  MSkel  Back: full range of motion of thoracic and lumbar spine. Non tender to palpation. Negative straight leg raise. DTR's are symmetrically intact. Sensation intact in all dermatomes of the lower  extremities. Full strength to manual muscle testing. patient is able to heel toe walk without difficulty and ambulates with normal gait. Trigger point tenderness over L gluteal region Psychiatric: She has a normal mood and affect. Her behavior is normal.   Lab Results  Component Value Date   WBC 6.2 01/27/2012   HGB 14.5 01/27/2012   HCT 42.4 01/27/2012   PLT 188 01/27/2012   GLUCOSE 129* 01/27/2012   CHOL 255* 02/13/2012   TRIG 133.0 02/13/2012   HDL 54.30 02/13/2012   LDLDIRECT 186.4 02/13/2012   LDLCALC  Value: 92        Total Cholesterol/HDL:CHD Risk Coronary Heart Disease Risk Table                     Men   Women  1/2 Average Risk   3.4   3.3  Average Risk       5.0   4.4  2 X Average Risk   9.6   7.1  3 X Average Risk  23.4   11.0        Use the calculated Patient Ratio above and the CHD Risk Table to determine the patient's CHD Risk.        ATP III CLASSIFICATION (LDL):  <100     mg/dL   Optimal  409-811  mg/dL   Near or Above                    Optimal  130-159  mg/dL   Borderline  914-782  mg/dL   High  >956     mg/dL   Very High 11/08/863   ALT 41* 12/22/2011   AST 38* 12/22/2011   NA 136 01/27/2012   K 3.0* 01/27/2012   CL 93* 01/27/2012   CREATININE 0.80 01/27/2012   BUN 9 01/27/2012   CO2 29 01/27/2012   TSH 3.755 12/11/2011   HGBA1C 7.4* 12/22/2011       Assessment & Plan:   Acute sinusitis - augmentin x 10days (given comorbid dz)  L buttock pain - suspect FM - see below, but check UA given associated with dysuria, rule out UTI  Also see problem list. Medications and labs reviewed today.

## 2012-04-10 NOTE — Assessment & Plan Note (Signed)
Chronic oxy for control of same exac by sciatica 01/2011 and again 06/2011> MRI with free fragment and encroachment on L4 root -  status post R L4-5 microdiscectomy 07/06/2011 > symptoms improved  L buttock tenderness representative of "trigger point" - reassured pt of same continue chronic pain mgmt as ongoing - refills provided

## 2012-04-10 NOTE — Assessment & Plan Note (Signed)
Acute exac 10/2011 and 12/2011- treated with temporary increase diuretic x 4 days  monitor sodium intake and monitor weights at home - call if increase weight >3#/day Encouragement provided on continued weight loss success Check labs to monitor lytes

## 2012-04-15 ENCOUNTER — Telehealth: Payer: Self-pay | Admitting: *Deleted

## 2012-04-15 NOTE — Telephone Encounter (Signed)
Received fax for PA on promethazine. Contacted insurance faxed over PA form to be completed. Place on md for completion... 04/15/12@11 :24am/LMB

## 2012-04-15 NOTE — Telephone Encounter (Signed)
done

## 2012-04-15 NOTE — Telephone Encounter (Signed)
Faxed Pa back awaiting on approval status...04/15/12@1 :58pm/LMB

## 2012-04-16 NOTE — Telephone Encounter (Signed)
Received PA back med has been approved. Notified pharmacy spoke with rebecca gave approval status,,, 04/16/12@10 :02am/LMB

## 2012-04-19 ENCOUNTER — Telehealth: Payer: Self-pay

## 2012-04-19 MED ORDER — FLUCONAZOLE 150 MG PO TABS: 150.0000 mg | ORAL_TABLET | Freq: Once | ORAL | Status: AC

## 2012-04-19 NOTE — Telephone Encounter (Signed)
Pt called stating that she developed a vaginal yeast infection after recent ABX use.

## 2012-04-19 NOTE — Telephone Encounter (Signed)
Pt informed

## 2012-04-19 NOTE — Telephone Encounter (Signed)
Diflucan - erx done 

## 2012-04-19 NOTE — Telephone Encounter (Signed)
Caller: [name]/Patient; PCP: [name]; CB#: [phone];  Call regarding Yeast Symptoms; 2 days left with Augmentin, taking for URI.  Vaginal itching and burning onset 04/17/12.  No discharge, afebrile.  Per Vaginal Discharge or Irritation Protocol all emergency Symtoms ruled out with exception of "Genital itching, burning or redness."  Home care advice given.  Disposition to see in 24hrs.  Do you want appt. or Rx?  Patient advised home care and request office to f/u for ov vs rx for yeast symptoms.

## 2012-04-21 ENCOUNTER — Emergency Department (HOSPITAL_COMMUNITY): Payer: Medicare Other

## 2012-04-21 ENCOUNTER — Encounter (HOSPITAL_COMMUNITY): Payer: Self-pay | Admitting: Emergency Medicine

## 2012-04-21 DIAGNOSIS — G4733 Obstructive sleep apnea (adult) (pediatric): Secondary | ICD-10-CM | POA: Diagnosis present

## 2012-04-21 DIAGNOSIS — I5032 Chronic diastolic (congestive) heart failure: Secondary | ICD-10-CM | POA: Diagnosis present

## 2012-04-21 DIAGNOSIS — B3731 Acute candidiasis of vulva and vagina: Secondary | ICD-10-CM | POA: Diagnosis present

## 2012-04-21 DIAGNOSIS — B373 Candidiasis of vulva and vagina: Secondary | ICD-10-CM | POA: Diagnosis present

## 2012-04-21 DIAGNOSIS — E785 Hyperlipidemia, unspecified: Secondary | ICD-10-CM | POA: Diagnosis present

## 2012-04-21 DIAGNOSIS — Z91199 Patient's noncompliance with other medical treatment and regimen due to unspecified reason: Secondary | ICD-10-CM

## 2012-04-21 DIAGNOSIS — E119 Type 2 diabetes mellitus without complications: Secondary | ICD-10-CM | POA: Diagnosis present

## 2012-04-21 DIAGNOSIS — I10 Essential (primary) hypertension: Secondary | ICD-10-CM | POA: Diagnosis present

## 2012-04-21 DIAGNOSIS — J441 Chronic obstructive pulmonary disease with (acute) exacerbation: Principal | ICD-10-CM | POA: Diagnosis present

## 2012-04-21 DIAGNOSIS — I509 Heart failure, unspecified: Secondary | ICD-10-CM | POA: Diagnosis present

## 2012-04-21 DIAGNOSIS — IMO0001 Reserved for inherently not codable concepts without codable children: Secondary | ICD-10-CM | POA: Diagnosis present

## 2012-04-21 DIAGNOSIS — Z9119 Patient's noncompliance with other medical treatment and regimen: Secondary | ICD-10-CM

## 2012-04-21 DIAGNOSIS — Z87891 Personal history of nicotine dependence: Secondary | ICD-10-CM

## 2012-04-21 DIAGNOSIS — D869 Sarcoidosis, unspecified: Secondary | ICD-10-CM | POA: Diagnosis present

## 2012-04-21 LAB — CBC
HCT: 43.4 % (ref 36.0–46.0)
Hemoglobin: 13.9 g/dL (ref 12.0–15.0)
RBC: 5.58 MIL/uL — ABNORMAL HIGH (ref 3.87–5.11)
RDW: 16.4 % — ABNORMAL HIGH (ref 11.5–15.5)
WBC: 7 10*3/uL (ref 4.0–10.5)

## 2012-04-21 LAB — BASIC METABOLIC PANEL
BUN: 13 mg/dL (ref 6–23)
CO2: 30 mEq/L (ref 19–32)
Chloride: 96 mEq/L (ref 96–112)
GFR calc Af Amer: 80 mL/min — ABNORMAL LOW (ref >90)
Potassium: 3.5 mEq/L (ref 3.5–5.1)

## 2012-04-21 MED ORDER — ALBUTEROL SULFATE (5 MG/ML) 0.5% IN NEBU
5.0000 mg | INHALATION_SOLUTION | Freq: Once | RESPIRATORY_TRACT | Status: AC
  Administered 2012-04-21: 5 mg via RESPIRATORY_TRACT

## 2012-04-21 MED ORDER — ALBUTEROL SULFATE (5 MG/ML) 0.5% IN NEBU
INHALATION_SOLUTION | RESPIRATORY_TRACT | Status: AC
  Administered 2012-04-21: 5 mg via RESPIRATORY_TRACT
  Filled 2012-04-21: qty 1

## 2012-04-21 MED ORDER — IPRATROPIUM BROMIDE 0.02 % IN SOLN
RESPIRATORY_TRACT | Status: AC
  Administered 2012-04-21: 0.5 mg via RESPIRATORY_TRACT
  Filled 2012-04-21: qty 2.5

## 2012-04-21 MED ORDER — IPRATROPIUM BROMIDE 0.02 % IN SOLN
0.5000 mg | Freq: Once | RESPIRATORY_TRACT | Status: AC
  Administered 2012-04-21: 0.5 mg via RESPIRATORY_TRACT

## 2012-04-21 NOTE — ED Notes (Signed)
Reports feeling tired, non-productive cough, chest pain, sob, and wheezing since yesterday.  Taking antibiotic for sinus infection.

## 2012-04-22 ENCOUNTER — Inpatient Hospital Stay (HOSPITAL_COMMUNITY)
Admission: EM | Admit: 2012-04-22 | Discharge: 2012-04-26 | DRG: 191 | Disposition: A | Discharge status: Home / Self Care | Payer: Medicare Other | Attending: Internal Medicine | Admitting: Internal Medicine

## 2012-04-22 ENCOUNTER — Encounter (HOSPITAL_COMMUNITY): Payer: Self-pay | Admitting: *Deleted

## 2012-04-22 ENCOUNTER — Telehealth: Payer: Self-pay | Admitting: Pulmonary Disease

## 2012-04-22 DIAGNOSIS — IMO0001 Reserved for inherently not codable concepts without codable children: Secondary | ICD-10-CM

## 2012-04-22 DIAGNOSIS — R51 Headache: Secondary | ICD-10-CM

## 2012-04-22 DIAGNOSIS — E119 Type 2 diabetes mellitus without complications: Secondary | ICD-10-CM

## 2012-04-22 DIAGNOSIS — B373 Candidiasis of vulva and vagina: Secondary | ICD-10-CM

## 2012-04-22 DIAGNOSIS — S92912A Unspecified fracture of left toe(s), initial encounter for closed fracture: Secondary | ICD-10-CM

## 2012-04-22 DIAGNOSIS — M5431 Sciatica, right side: Secondary | ICD-10-CM

## 2012-04-22 DIAGNOSIS — G4733 Obstructive sleep apnea (adult) (pediatric): Secondary | ICD-10-CM | POA: Diagnosis present

## 2012-04-22 DIAGNOSIS — F411 Generalized anxiety disorder: Secondary | ICD-10-CM

## 2012-04-22 DIAGNOSIS — D649 Anemia, unspecified: Secondary | ICD-10-CM

## 2012-04-22 DIAGNOSIS — I10 Essential (primary) hypertension: Secondary | ICD-10-CM

## 2012-04-22 DIAGNOSIS — J9610 Chronic respiratory failure, unspecified whether with hypoxia or hypercapnia: Secondary | ICD-10-CM

## 2012-04-22 DIAGNOSIS — J441 Chronic obstructive pulmonary disease with (acute) exacerbation: Secondary | ICD-10-CM

## 2012-04-22 DIAGNOSIS — J309 Allergic rhinitis, unspecified: Secondary | ICD-10-CM

## 2012-04-22 DIAGNOSIS — E662 Morbid (severe) obesity with alveolar hypoventilation: Secondary | ICD-10-CM

## 2012-04-22 DIAGNOSIS — J449 Chronic obstructive pulmonary disease, unspecified: Secondary | ICD-10-CM

## 2012-04-22 DIAGNOSIS — E785 Hyperlipidemia, unspecified: Secondary | ICD-10-CM

## 2012-04-22 DIAGNOSIS — K219 Gastro-esophageal reflux disease without esophagitis: Secondary | ICD-10-CM

## 2012-04-22 DIAGNOSIS — I5032 Chronic diastolic (congestive) heart failure: Secondary | ICD-10-CM

## 2012-04-22 DIAGNOSIS — D869 Sarcoidosis, unspecified: Secondary | ICD-10-CM

## 2012-04-22 DIAGNOSIS — I509 Heart failure, unspecified: Secondary | ICD-10-CM

## 2012-04-22 DIAGNOSIS — N912 Amenorrhea, unspecified: Secondary | ICD-10-CM

## 2012-04-22 DIAGNOSIS — R9430 Abnormal result of cardiovascular function study, unspecified: Secondary | ICD-10-CM

## 2012-04-22 DIAGNOSIS — E1165 Type 2 diabetes mellitus with hyperglycemia: Secondary | ICD-10-CM

## 2012-04-22 LAB — COMPREHENSIVE METABOLIC PANEL
ALT: 48 U/L — ABNORMAL HIGH (ref 0–35)
AST: 35 U/L (ref 0–37)
Alkaline Phosphatase: 141 U/L — ABNORMAL HIGH (ref 39–117)
CO2: 31 mEq/L (ref 19–32)
GFR calc Af Amer: >90 mL/min (ref >90)
Glucose, Bld: 173 mg/dL — ABNORMAL HIGH (ref 70–99)
Potassium: 3.5 mEq/L (ref 3.5–5.1)
Sodium: 138 mEq/L (ref 135–145)
Total Protein: 7.8 g/dL (ref 6.0–8.3)

## 2012-04-22 LAB — CARDIAC PANEL(CRET KIN+CKTOT+MB+TROPI)
CK, MB: 1.8 ng/mL (ref 0.3–4.0)
CK, MB: 2 ng/mL (ref 0.3–4.0)
Relative Index: INVALID (ref 0.0–2.5)
Troponin I: <0.3 ng/mL (ref <0.30)

## 2012-04-22 LAB — CBC WITH DIFFERENTIAL/PLATELET
Basophils Absolute: 0 10*3/uL (ref 0.0–0.1)
Basophils Relative: 0 % (ref 0–1)
Eosinophils Absolute: 0.1 10*3/uL (ref 0.0–0.7)
Eosinophils Relative: 2 % (ref 0–5)
HCT: 42.9 % (ref 36.0–46.0)
MCH: 24.6 pg — ABNORMAL LOW (ref 26.0–34.0)
MCHC: 31.5 g/dL (ref 30.0–36.0)
Monocytes Absolute: 0.1 10*3/uL (ref 0.1–1.0)
Monocytes Relative: 1 % — ABNORMAL LOW (ref 3–12)
Neutro Abs: 6 10*3/uL (ref 1.7–7.7)
RDW: 16.5 % — ABNORMAL HIGH (ref 11.5–15.5)

## 2012-04-22 LAB — MRSA PCR SCREENING: MRSA by PCR: NEGATIVE

## 2012-04-22 LAB — GLUCOSE, CAPILLARY
Glucose-Capillary: 312 mg/dL — ABNORMAL HIGH (ref 70–99)
Glucose-Capillary: 244 mg/dL — ABNORMAL HIGH (ref 70–99)

## 2012-04-22 LAB — PREGNANCY, URINE: Preg Test, Ur: NEGATIVE

## 2012-04-22 MED ORDER — OXYCODONE HCL 5 MG PO TABS
5.0000 mg | ORAL_TABLET | Freq: Three times a day (TID) | ORAL | Status: DC | PRN
  Administered 2012-04-22 – 2012-04-26 (×9): 10 mg via ORAL
  Filled 2012-04-22 (×9): qty 2

## 2012-04-22 MED ORDER — ENOXAPARIN SODIUM 80 MG/0.8ML ~~LOC~~ SOLN
70.0000 mg | SUBCUTANEOUS | Status: DC
  Administered 2012-04-22: 40 mg via SUBCUTANEOUS
  Administered 2012-04-23 – 2012-04-26 (×4): 70 mg via SUBCUTANEOUS
  Filled 2012-04-22 (×5): qty 0.8

## 2012-04-22 MED ORDER — GUAIFENESIN-DM 100-10 MG/5ML PO SYRP
5.0000 mL | ORAL_SOLUTION | ORAL | Status: DC | PRN
  Administered 2012-04-22 – 2012-04-25 (×4): 5 mL via ORAL
  Filled 2012-04-22 (×4): qty 5

## 2012-04-22 MED ORDER — FLUTICASONE PROPIONATE 50 MCG/ACT NA SUSP
1.0000 | Freq: Every day | NASAL | Status: DC
  Administered 2012-04-22 – 2012-04-26 (×5): 1 via NASAL
  Filled 2012-04-22: qty 16

## 2012-04-22 MED ORDER — AMLODIPINE BESYLATE 5 MG PO TABS
5.0000 mg | ORAL_TABLET | Freq: Every day | ORAL | Status: DC
  Administered 2012-04-22 – 2012-04-26 (×5): 5 mg via ORAL
  Filled 2012-04-22 (×5): qty 1

## 2012-04-22 MED ORDER — ACETAMINOPHEN 650 MG RE SUPP: 650.0000 mg | Freq: Four times a day (QID) | RECTAL | Status: DC | PRN

## 2012-04-22 MED ORDER — ALBUTEROL SULFATE (5 MG/ML) 0.5% IN NEBU
5.0000 mg | INHALATION_SOLUTION | Freq: Once | RESPIRATORY_TRACT | Status: AC
  Administered 2012-04-22: 5 mg via RESPIRATORY_TRACT
  Filled 2012-04-22: qty 1

## 2012-04-22 MED ORDER — PANTOPRAZOLE SODIUM 40 MG PO TBEC: 40.0000 mg | DELAYED_RELEASE_TABLET | Freq: Every day | ORAL | Status: DC

## 2012-04-22 MED ORDER — ONDANSETRON HCL 4 MG/2ML IJ SOLN
4.0000 mg | Freq: Four times a day (QID) | INTRAMUSCULAR | Status: DC | PRN
  Administered 2012-04-22: 4 mg via INTRAVENOUS
  Filled 2012-04-22: qty 2

## 2012-04-22 MED ORDER — SERTRALINE HCL 25 MG PO TABS
25.0000 mg | ORAL_TABLET | Freq: Every day | ORAL | Status: DC
  Administered 2012-04-22 – 2012-04-26 (×5): 25 mg via ORAL
  Filled 2012-04-22 (×5): qty 1

## 2012-04-22 MED ORDER — ENOXAPARIN SODIUM 40 MG/0.4ML ~~LOC~~ SOLN
40.0000 mg | Freq: Every day | SUBCUTANEOUS | Status: DC
  Filled 2012-04-22: qty 0.4

## 2012-04-22 MED ORDER — INSULIN ASPART 100 UNIT/ML ~~LOC~~ SOLN
0.0000 [IU] | Freq: Three times a day (TID) | SUBCUTANEOUS | Status: DC
  Administered 2012-04-22: 7 [IU] via SUBCUTANEOUS
  Administered 2012-04-22: 3 [IU] via SUBCUTANEOUS
  Administered 2012-04-22: 2 [IU] via SUBCUTANEOUS
  Administered 2012-04-23: 7 [IU] via SUBCUTANEOUS
  Administered 2012-04-23 – 2012-04-24 (×3): 3 [IU] via SUBCUTANEOUS
  Administered 2012-04-24 (×2): 5 [IU] via SUBCUTANEOUS
  Administered 2012-04-25: 7 [IU] via SUBCUTANEOUS
  Administered 2012-04-25 (×2): 5 [IU] via SUBCUTANEOUS
  Administered 2012-04-26: 2 [IU] via SUBCUTANEOUS

## 2012-04-22 MED ORDER — IPRATROPIUM BROMIDE 0.02 % IN SOLN
0.5000 mg | Freq: Three times a day (TID) | RESPIRATORY_TRACT | Status: DC
  Administered 2012-04-23: 0.5 mg via RESPIRATORY_TRACT
  Filled 2012-04-22: qty 2.5

## 2012-04-22 MED ORDER — ALPRAZOLAM 0.5 MG PO TABS
1.0000 mg | ORAL_TABLET | Freq: Two times a day (BID) | ORAL | Status: DC | PRN
  Administered 2012-04-22 – 2012-04-26 (×7): 1 mg via ORAL
  Filled 2012-04-22: qty 2
  Filled 2012-04-22 (×2): qty 1
  Filled 2012-04-22 (×5): qty 2

## 2012-04-22 MED ORDER — METHYLPREDNISOLONE SODIUM SUCC 40 MG IJ SOLR
40.0000 mg | Freq: Every day | INTRAMUSCULAR | Status: DC
  Administered 2012-04-22: 40 mg via INTRAVENOUS
  Filled 2012-04-22 (×2): qty 1

## 2012-04-22 MED ORDER — LORATADINE 10 MG PO TABS
10.0000 mg | ORAL_TABLET | Freq: Every day | ORAL | Status: DC
  Administered 2012-04-22 – 2012-04-26 (×5): 10 mg via ORAL
  Filled 2012-04-22 (×5): qty 1

## 2012-04-22 MED ORDER — TORSEMIDE 100 MG PO TABS
100.0000 mg | ORAL_TABLET | Freq: Two times a day (BID) | ORAL | Status: DC
  Administered 2012-04-22 – 2012-04-26 (×9): 100 mg via ORAL
  Filled 2012-04-22 (×11): qty 1

## 2012-04-22 MED ORDER — GLIMEPIRIDE 1 MG PO TABS
1.0000 mg | ORAL_TABLET | Freq: Every day | ORAL | Status: DC
  Administered 2012-04-22 – 2012-04-26 (×5): 1 mg via ORAL
  Filled 2012-04-22 (×6): qty 1

## 2012-04-22 MED ORDER — ONDANSETRON HCL 4 MG PO TABS: 4.0000 mg | ORAL_TABLET | Freq: Four times a day (QID) | ORAL | Status: DC | PRN

## 2012-04-22 MED ORDER — ALBUTEROL SULFATE (5 MG/ML) 0.5% IN NEBU
2.5000 mg | INHALATION_SOLUTION | RESPIRATORY_TRACT | Status: DC
  Administered 2012-04-22 (×4): 2.5 mg via RESPIRATORY_TRACT
  Filled 2012-04-22 (×4): qty 0.5

## 2012-04-22 MED ORDER — IPRATROPIUM BROMIDE 0.02 % IN SOLN: 0.5000 mg | RESPIRATORY_TRACT | Status: DC

## 2012-04-22 MED ORDER — METHYLPREDNISOLONE SODIUM SUCC 125 MG IJ SOLR
125.0000 mg | Freq: Once | INTRAMUSCULAR | Status: AC
  Administered 2012-04-22: 125 mg via INTRAVENOUS
  Filled 2012-04-22: qty 2

## 2012-04-22 MED ORDER — SODIUM CHLORIDE 0.9 % IJ SOLN
3.0000 mL | Freq: Two times a day (BID) | INTRAMUSCULAR | Status: DC
  Administered 2012-04-25: 3 mL via INTRAVENOUS

## 2012-04-22 MED ORDER — IPRATROPIUM BROMIDE 0.02 % IN SOLN
0.5000 mg | Freq: Once | RESPIRATORY_TRACT | Status: AC
  Administered 2012-04-22: 0.5 mg via RESPIRATORY_TRACT
  Filled 2012-04-22: qty 2.5

## 2012-04-22 MED ORDER — SPIRONOLACTONE 25 MG PO TABS
25.0000 mg | ORAL_TABLET | Freq: Every day | ORAL | Status: DC
  Administered 2012-04-22 – 2012-04-26 (×5): 25 mg via ORAL
  Filled 2012-04-22 (×5): qty 1

## 2012-04-22 MED ORDER — POTASSIUM CHLORIDE CRYS ER 20 MEQ PO TBCR
20.0000 meq | EXTENDED_RELEASE_TABLET | Freq: Two times a day (BID) | ORAL | Status: DC
  Administered 2012-04-22 – 2012-04-26 (×9): 20 meq via ORAL
  Filled 2012-04-22 (×11): qty 1

## 2012-04-22 MED ORDER — IPRATROPIUM BROMIDE 0.02 % IN SOLN
0.5000 mg | RESPIRATORY_TRACT | Status: DC
  Administered 2012-04-22 (×4): 0.5 mg via RESPIRATORY_TRACT
  Filled 2012-04-22 (×4): qty 2.5

## 2012-04-22 MED ORDER — PANTOPRAZOLE SODIUM 40 MG PO TBEC
40.0000 mg | DELAYED_RELEASE_TABLET | Freq: Two times a day (BID) | ORAL | Status: DC
  Administered 2012-04-22 – 2012-04-26 (×9): 40 mg via ORAL
  Filled 2012-04-22 (×9): qty 1

## 2012-04-22 MED ORDER — ALBUTEROL SULFATE (5 MG/ML) 0.5% IN NEBU
2.5000 mg | INHALATION_SOLUTION | Freq: Three times a day (TID) | RESPIRATORY_TRACT | Status: DC
  Administered 2012-04-23: 2.5 mg via RESPIRATORY_TRACT
  Filled 2012-04-22: qty 0.5

## 2012-04-22 MED ORDER — LEVOFLOXACIN IN D5W 750 MG/150ML IV SOLN
750.0000 mg | INTRAVENOUS | Status: DC
  Administered 2012-04-22 – 2012-04-26 (×5): 750 mg via INTRAVENOUS
  Filled 2012-04-22 (×6): qty 150

## 2012-04-22 MED ORDER — INSULIN GLARGINE 100 UNIT/ML ~~LOC~~ SOLN
8.0000 [IU] | Freq: Every day | SUBCUTANEOUS | Status: DC
  Administered 2012-04-22 – 2012-04-24 (×3): 16 [IU] via SUBCUTANEOUS

## 2012-04-22 MED ORDER — CYCLOBENZAPRINE HCL 10 MG PO TABS
10.0000 mg | ORAL_TABLET | Freq: Three times a day (TID) | ORAL | Status: DC | PRN
  Administered 2012-04-24: 10 mg via ORAL
  Filled 2012-04-22: qty 1

## 2012-04-22 MED ORDER — BUDESONIDE 0.5 MG/2ML IN SUSP
0.5000 mg | Freq: Two times a day (BID) | RESPIRATORY_TRACT | Status: DC
  Administered 2012-04-22 – 2012-04-26 (×9): 0.5 mg via RESPIRATORY_TRACT
  Filled 2012-04-22 (×12): qty 2

## 2012-04-22 MED ORDER — ASPIRIN EC 81 MG PO TBEC
81.0000 mg | DELAYED_RELEASE_TABLET | Freq: Every day | ORAL | Status: DC
  Administered 2012-04-22 – 2012-04-26 (×5): 81 mg via ORAL
  Filled 2012-04-22 (×5): qty 1

## 2012-04-22 MED ORDER — ACETAMINOPHEN 325 MG PO TABS
650.0000 mg | ORAL_TABLET | Freq: Four times a day (QID) | ORAL | Status: DC | PRN
  Administered 2012-04-22: 650 mg via ORAL
  Filled 2012-04-22: qty 2

## 2012-04-22 MED ORDER — DEXTROSE 5 % IV SOLN
500.0000 mg | Freq: Every day | INTRAVENOUS | Status: DC
  Filled 2012-04-22: qty 500

## 2012-04-22 MED ORDER — ALBUTEROL SULFATE (5 MG/ML) 0.5% IN NEBU: 2.5000 mg | INHALATION_SOLUTION | RESPIRATORY_TRACT | Status: DC | PRN

## 2012-04-22 MED ORDER — SODIUM CHLORIDE 0.9 % IJ SOLN
3.0000 mL | Freq: Two times a day (BID) | INTRAMUSCULAR | Status: DC
  Administered 2012-04-22 – 2012-04-25 (×6): 3 mL via INTRAVENOUS
  Administered 2012-04-26: 6 mL via INTRAVENOUS

## 2012-04-22 NOTE — ED Notes (Signed)
Admitting MD writing orders for pt. Pt denies CP.

## 2012-04-22 NOTE — H&P (Signed)
[name] is an 46 y.o. female.   Patient was seen and examined on April 22, 2012 at 4:30 AM. PCP - Dr.Valarie Felicity Coyer. Chief Complaint:  shortness of breath. HPI:  46 year old female with known history of COPD, hypertension, OSA on CPAP, sarcoidosis, diabetes mellitus2 has had upper respiratory tract infection last week and was on Augmentin. Last 4 days patient has developed increasing shortness of breath with wheezing coughing productive sputum.As shortness of breath persisted she presented to the ER. Chest x-ray did not show any infiltrates. Patient on exam is wheezing and has been admitted for COPD exacerbation. Patient gets chest pain and she coughs otherwise she is chest pain-free. Her shortness of breath improved with albuterol nebulizer.  Past Medical History  Diagnosis Date  . Sleep apnea     noncompliant w/ CPAP  . Hypertension   . Hyperlipidemia   . Chronic headache   . Fibromyalgia     daily narcotics  . Anxiety     hx chronic BZ use, stopped 07/2010  . Anemia   . Pulmonary sarcoidosis     unimpressive CT chest 2011  . Colonic polyp   . GERD (gastroesophageal reflux disease)   . ALLERGIC RHINITIS   . Asthma   . CHF (congestive heart failure)     Diastolic with fluid overload, May, 2012, LVEF 60%  . Morbid obesity   . Depression   . Panic attacks   . Diabetes mellitus   . COPD (chronic obstructive pulmonary disease)     on home O2, moderate airflow obstruction, suspect d/t emphysema  . Obesity   . Pneumonia     Past Surgical History  Procedure Date  . Polypectomy 2011  . Lumbar microdiscectomy 07/06/2011    R L4-5, stern  . Back surgery     Family History  Problem Relation Age of Onset  . Hypertension Mother   . Emphysema Father   . Hypertension Father   . Stomach cancer Father   . Allergies Brother   . Hypertension Brother   . Stomach cancer Brother   . Heart disease Mother   . Heart disease Father   . Heart disease Brother    Social History:   reports that she quit smoking about 19 months ago. Her smoking use included Cigarettes. She has a 29 pack-year smoking history. She has never used smokeless tobacco. She reports that she drinks alcohol. She reports that she does not use illicit drugs.  Allergies:  Allergies  Allergen Reactions  . Ciprofloxacin     REACTION: nausea  . Latex     REACTION: rash, burning  . Sulfamethoxazole W-Trimethoprim     Bactrim REACTION: Projectile Vomiting  . Azithromycin     REACTION: resistant  . Doxycycline     REACTION: restistant    Medications Prior to Admission  Medication Sig Dispense Refill  . albuterol (PROVENTIL HFA;VENTOLIN HFA) 108 (90 BASE) MCG/ACT inhaler Inhale 2 puffs into the lungs every 6 (six) hours as needed. For wheeze or shortness of breath      . albuterol (PROVENTIL) (2.5 MG/3ML) 0.083% nebulizer solution Take 2.5 mg by nebulization every 6 (six) hours as needed. For wheeze or shortness of breath      . ALPRAZolam (XANAX) 1 MG tablet Take 1 mg by mouth 2 (two) times daily as needed. For anxiety      . amLODipine (NORVASC) 5 MG tablet Take 1 tablet (5 mg total) by mouth daily.  30 tablet  5  . amoxicillin-clavulanate (AUGMENTIN)  875-125 MG per tablet Take 1 tablet by mouth 2 (two) times daily. Patient has a couple days left-treating sinus infection.      Marland Kitchen aspirin 81 MG EC tablet Take 81 mg by mouth daily.      . budesonide-formoterol (SYMBICORT) 160-4.5 MCG/ACT inhaler Inhale 2 puffs into the lungs 2 (two) times daily.      . cetirizine (ZYRTEC) 10 MG tablet Take 10 mg by mouth at bedtime.      . cyclobenzaprine (FLEXERIL) 10 MG tablet Take 10 mg by mouth 3 (three) times daily as needed. For pain      . esomeprazole (NEXIUM) 40 MG capsule Take 40 mg by mouth daily before breakfast.      . glimepiride (AMARYL) 1 MG tablet Take 1 tablet (1 mg total) by mouth daily before breakfast.  30 tablet  5  . insulin glargine (LANTUS) 100 UNIT/ML injection Inject 8-16 Units into the skin  at bedtime. If blood sugar is over 100, patient uses 16 units. If under 100, patient uses 8 units. Last dose was 8 units Saturday night 04/20/12      . mometasone (NASONEX) 50 MCG/ACT nasal spray Place 2 sprays into the nose daily.  17 g  12  . Oxycodone HCl 10 MG TABS Take 0.5-1 tablets (5-10 mg total) by mouth 3 (three) times daily as needed. For pain  60 tablet  0  . potassium chloride SA (K-DUR,KLOR-CON) 20 MEQ tablet Take 20 mEq by mouth 2 (two) times daily.       . promethazine (PHENERGAN) 25 MG tablet Take 1 tablet (25 mg total) by mouth every 6 (six) hours as needed. For nausea  30 tablet  1  . sertraline (ZOLOFT) 25 MG tablet Take 25 mg by mouth daily.      Marland Kitchen spironolactone (ALDACTONE) 25 MG tablet Take 1 by mouth daily      . tiotropium (SPIRIVA HANDIHALER) 18 MCG inhalation capsule Place 1 capsule (18 mcg total) into inhaler and inhale daily.  30 capsule  5  . torsemide (DEMADEX) 100 MG tablet Take 1 tablet (100 mg total) by mouth 2 (two) times daily with a meal.  60 tablet  3    Results for orders placed during the hospital encounter of 04/22/12 (from the past 48 hour(s))  CBC     Status: Abnormal   Collection Time   04/21/12 11:08 PM      Component Value Range Comment   WBC 7.0  4.0 - 10.5 K/uL    RBC 5.58 (*) 3.87 - 5.11 MIL/uL    Hemoglobin 13.9  12.0 - 15.0 g/dL    HCT 36.6  44.0 - 34.7 %    MCV 77.8 (*) 78.0 - 100.0 fL    MCH 24.9 (*) 26.0 - 34.0 pg    MCHC 32.0  30.0 - 36.0 g/dL    RDW 42.5 (*) 95.6 - 15.5 %    Platelets 189  150 - 400 K/uL   BASIC METABOLIC PANEL     Status: Abnormal   Collection Time   04/21/12 11:08 PM      Component Value Range Comment   Sodium 135  135 - 145 mEq/L    Potassium 3.5  3.5 - 5.1 mEq/L    Chloride 96  96 - 112 mEq/L    CO2 30  19 - 32 mEq/L    Glucose, Bld 137 (*) 70 - 99 mg/dL    BUN 13  6 - 23 mg/dL  Creatinine, Ser 0.98  0.50 - 1.10 mg/dL    Calcium 9.2  8.4 - 11.9 mg/dL    GFR calc non Af Amer 69 (*) >90 mL/min    GFR calc  Af Amer 80 (*) >90 mL/min   POCT I-STAT TROPONIN I     Status: Normal   Collection Time   04/21/12 11:10 PM      Component Value Range Comment   Troponin i, poc 0.00  0.00 - 0.08 ng/mL    Comment 3             Dg Chest 2 View  04/21/2012  *RADIOLOGY REPORT*  Clinical Data: Shortness of breath.  Chest pain.  CHEST - 2 VIEW  Comparison: 01/27/2012  Findings: Cardiac size is upper limits normal.  There are perihilar bronchitic changes and coarse interstitial markings, stable in appearance.  No focal consolidations or pleural effusions.  No edema.  IMPRESSION: Stable appearance of the chest.  Original Report Authenticated By: Patterson Hammersmith, M.D.    Review of Systems  Constitutional: Negative.   HENT: Positive for congestion.   Eyes: Negative.   Respiratory: Positive for cough and shortness of breath.   Cardiovascular: Negative.   Gastrointestinal: Negative.   Genitourinary: Negative.   Musculoskeletal: Negative.   Skin: Negative.   Neurological: Negative.   Endo/Heme/Allergies: Negative.   Psychiatric/Behavioral: Negative.     Blood pressure 126/73, pulse 90, temperature 98 F (36.7 C), temperature source Oral, resp. rate 20, height 5\' 9"  (1.753 m), weight 134.809 kg (297 lb 3.2 oz), last menstrual period 12/28/2011, SpO2 91.00%. Physical Exam  Constitutional: She is oriented to person, place, and time. She appears well-developed and well-nourished. No distress.  HENT:  Head: Normocephalic and atraumatic.  Right Ear: External ear normal.  Left Ear: External ear normal.  Nose: Nose normal.  Mouth/Throat: Oropharynx is clear and moist. No oropharyngeal exudate.  Eyes: Conjunctivae are normal. Pupils are equal, round, and reactive to light. Right eye exhibits no discharge. Left eye exhibits no discharge. No scleral icterus.  Neck: Normal range of motion. Neck supple.  Cardiovascular: Normal rate and regular rhythm.   Respiratory: Effort normal. No respiratory distress. She has  wheezes. She has no rales.  GI: Soft. Bowel sounds are normal. She exhibits no distension. There is no tenderness. There is no rebound.  Musculoskeletal: Normal range of motion. She exhibits no edema and no tenderness.  Neurological: She is alert and oriented to person, place, and time.       Moves all extremities.  Skin: Skin is warm and dry. She is not diaphoretic.  Psychiatric: Her behavior is normal.     Assessment/Plan #1. COPD exacerbation - continue nebulizer, steroids, Zithromax, Pulmicort. #2. History of diastolic CHF - presently her shortness of breath looks more like COPD. Continue torsemide and spironolactone. #3. History of OSA - continue CPAP. #4. Diabetes mellitus2 - continue home medications. Closely watch CBG as patient is on steroids. #5. Hypertension - continue medications.  CODE STATUS - full code.   [name] N. 04/22/2012, 5:18 AM

## 2012-04-22 NOTE — Telephone Encounter (Signed)
Called back and spoke with patient who was admitted this morning to [id number] at [facility].  Patient states she has gained 10 lbs since last week and her wheezing has gotten worse. "They" tell her she is having a COPD exacerbation. She just wanted to make sure [facility] knew about this admittance. I informed patient that Jonathon Bellows is currently not in town but would be returning tomorrow and I would forward him the message.  She verbalized understanding of this and had nothing further needed at this time.

## 2012-04-22 NOTE — ED Notes (Addendum)
Pt states that she has been having intermittant CP. Pt states that she is typically on 2-3L of O2 (depending on excertion.) pt states she is at a normal level of SOB. Pt states when the pain started yesterday it was an 8/10, center chest. Pt states that she came in tonight because when she laid down she was starting to feel like she was drowning. Pt states coughing but cannot get anything up.

## 2012-04-22 NOTE — ED Provider Notes (Signed)
History     CSN: [id number]  Arrival date & time 04/21/12  2201   First MD Initiated Contact with Patient 04/22/12 0047      Chief Complaint  Patient presents with  . Shortness of Breath  . Chest Pain    (Consider location/radiation/quality/duration/timing/severity/associated sxs/prior treatment) HPI 46 year old female presents to the emergency apartment complaining of shortness of breath wheezing and cough and generalized fatigue. Patient reports she is recently been treated for sinusitis with Augmentin. She overall has felt unwell for the last week. Yesterday she had some sharp chest pain that was left-sided lasting 5-6 minutes. Patient reports she was able to get into a more comfortable position then pain would ease off. Patient denies any fever. She is on her baseline 1-2 L for ongoing COPD. Patient reports last flare was last year. She's not been on steroids since that time.   Past Medical History  Diagnosis Date  . Sleep apnea     noncompliant w/ CPAP  . Hypertension   . Hyperlipidemia   . Chronic headache   . Fibromyalgia     daily narcotics  . Anxiety     hx chronic BZ use, stopped 07/2010  . Anemia   . Pulmonary sarcoidosis     unimpressive CT chest 2011  . Colonic polyp   . GERD (gastroesophageal reflux disease)   . ALLERGIC RHINITIS   . Asthma   . CHF (congestive heart failure)     Diastolic with fluid overload, May, 2012, LVEF 60%  . Morbid obesity   . Depression   . Panic attacks   . Diabetes mellitus   . COPD (chronic obstructive pulmonary disease)     on home O2, moderate airflow obstruction, suspect d/t emphysema  . Obesity   . Pneumonia     Past Surgical History  Procedure Date  . Polypectomy 2011  . Lumbar microdiscectomy 07/06/2011    R L4-5, stern  . Back surgery     Family History  Problem Relation Age of Onset  . Hypertension Mother   . Emphysema Father   . Hypertension Father   . Stomach cancer Father   . Allergies Brother   .  Hypertension Brother   . Stomach cancer Brother   . Heart disease Mother   . Heart disease Father   . Heart disease Brother     History  Substance Use Topics  . Smoking status: Former Smoker -- 1.0 packs/day for 29 years    Types: Cigarettes    Quit date: 09/14/2010  . Smokeless tobacco: Never Used  . Alcohol Use: Yes     occasionally    OB History    Grav Para Term Preterm Abortions TAB SAB Ect Mult Living                  Review of Systems  All other systems reviewed and are negative.    Allergies  Ciprofloxacin; Latex; Sulfamethoxazole w-trimethoprim; Azithromycin; and Doxycycline  Home Medications  No current outpatient prescriptions on file.  BP 126/73  Pulse 90  Temp 98 F (36.7 C) (Oral)  Resp 20  Ht 5\' 9"  (1.753 m)  Wt 297 lb 3.2 oz (134.809 kg)  BMI 43.89 kg/m2  SpO2 91%  LMP 12/28/2011  Physical Exam  Nursing note and vitals reviewed. Constitutional: She is oriented to person, place, and time. She appears well-developed and well-nourished.       Morbid obesity  HENT:  Head: Normocephalic and atraumatic.  Nose: Nose normal.  Mouth/Throat: Oropharynx is clear and moist.  Eyes: Conjunctivae and EOM are normal. Pupils are equal, round, and reactive to light.  Neck: Normal range of motion. Neck supple. No JVD present. No tracheal deviation present. No thyromegaly present.  Cardiovascular: Normal rate, regular rhythm, normal heart sounds and intact distal pulses.  Exam reveals no gallop and no friction rub.   No murmur heard. Pulmonary/Chest: Effort normal. No stridor. No respiratory distress. She has wheezes. She has no rales. She exhibits no tenderness.       Prolonged expiratory phase  Abdominal: Soft. Bowel sounds are normal. She exhibits no distension and no mass. There is no tenderness. There is no rebound and no guarding.  Musculoskeletal: Normal range of motion. She exhibits no edema and no tenderness.  Lymphadenopathy:    She has no cervical  adenopathy.  Neurological: She is alert and oriented to person, place, and time. She exhibits normal muscle tone. Coordination normal.  Skin: Skin is dry. No rash noted. No erythema. No pallor.  Psychiatric: She has a normal mood and affect. Her behavior is normal. Judgment and thought content normal.    ED Course  Procedures (including critical care time)  Labs Reviewed  CBC - Abnormal; Notable for the following:    RBC 5.58 (*)     MCV 77.8 (*)     MCH 24.9 (*)     RDW 16.4 (*)     All other components within normal limits  BASIC METABOLIC PANEL - Abnormal; Notable for the following:    Glucose, Bld 137 (*)     GFR calc non Af Amer 69 (*)     GFR calc Af Amer 80 (*)     All other components within normal limits  COMPREHENSIVE METABOLIC PANEL - Abnormal; Notable for the following:    Glucose, Bld 173 (*)     ALT 48 (*)     Alkaline Phosphatase 141 (*)     GFR calc non Af Amer 78 (*)     All other components within normal limits  CBC WITH DIFFERENTIAL - Abnormal; Notable for the following:    RBC 5.49 (*)     MCH 24.6 (*)     RDW 16.5 (*)     Neutrophils Relative 84 (*)     Monocytes Relative 1 (*)     All other components within normal limits  GLUCOSE, CAPILLARY - Abnormal; Notable for the following:    Glucose-Capillary 184 (*)     All other components within normal limits  POCT I-STAT TROPONIN I  MRSA PCR SCREENING  PREGNANCY, URINE  TSH  CBC  CBC  PRO B NATRIURETIC PEPTIDE   Dg Chest 2 View  04/21/2012  *RADIOLOGY REPORT*  Clinical Data: Shortness of breath.  Chest pain.  CHEST - 2 VIEW  Comparison: 01/27/2012  Findings: Cardiac size is upper limits normal.  There are perihilar bronchitic changes and coarse interstitial markings, stable in appearance.  No focal consolidations or pleural effusions.  No edema.  IMPRESSION: Stable appearance of the chest.  Original Report Authenticated By: Patterson Hammersmith, M.D.    Date: 04/21/2012  Rate: 83  Rhythm: normal sinus  rhythm  QRS Axis: normal  Intervals: normal  ST/T Wave abnormalities: normal  Conduction Disutrbances:none  Narrative Interpretation:   Old EKG Reviewed: none available    1. COPD exacerbation   2. CHF (congestive heart failure)   3. Headache   4. Unspecified essential hypertension       MDM  46 year old female with shortness of breath wheezing and chest pain. Suspect COPD exacerbation. EKG and cardiac markers without concern. To be admitted for continued nebs and steroid treatment        Olivia Mackie, MD 04/22/12 [phone]

## 2012-04-22 NOTE — ED Notes (Signed)
Admitting MD at bedside. Pt aware of bed, no orders.

## 2012-04-22 NOTE — Progress Notes (Signed)
Subjective: Patient relates intermittent chest pain, cough. Breathing better.   Objective: Filed Vitals:   04/22/12 0345 04/22/12 0415 04/22/12 0444 04/22/12 0826  BP: 102/55 117/58 126/73   Pulse: 86 93 90   Temp:   98 F (36.7 C)   TempSrc:   Oral   Resp: 21 21 20    Height:   5\' 9"  (1.753 m)   Weight:   134.809 kg (297 lb 3.2 oz)   SpO2: 92% 96% 91% 93%   Weight change:    General: Alert, awake, oriented x3, in no acute distress.  HEENT: No bruits, no goiter.  Heart: Regular rate and rhythm, without murmurs, rubs, gallops.  Lungs: Crackles bases, no wheezes,  bilateral air movement.  Abdomen: Soft, nontender, nondistended, positive bowel sounds.  Neuro: Grossly intact, nonfocal. Extremities; no edema.   Lab Results:  [facility] 04/22/12 0645 04/21/12 2308  NA 138 135  K 3.5 3.5  CL 97 96  CO2 31 30  GLUCOSE 173* 137*  BUN 14 13  CREATININE 0.88 0.98  CALCIUM 9.5 9.2  MG -- --  PHOS -- --    Basename 04/22/12 0645  AST 35  ALT 48*  ALKPHOS 141*  BILITOT 0.5  PROT 7.8  ALBUMIN 3.6    Basename 04/22/12 0645 04/21/12 2308  WBC 7.1 7.0  NEUTROABS 6.0 --  HGB 13.5 13.9  HCT 42.9 43.4  MCV 78.1 77.8*  PLT 191 189    Micro Results: Recent Results (from the past 240 hour(s))  MRSA PCR SCREENING     Status: Normal   Collection Time   04/22/12  4:50 AM      Component Value Range Status Comment   MRSA by PCR NEGATIVE  NEGATIVE Final     Studies/Results: Dg Chest 2 View  04/21/2012  *RADIOLOGY REPORT*  Clinical Data: Shortness of breath.  Chest pain.  CHEST - 2 VIEW  Comparison: 01/27/2012  Findings: Cardiac size is upper limits normal.  There are perihilar bronchitic changes and coarse interstitial markings, stable in appearance.  No focal consolidations or pleural effusions.  No edema.  IMPRESSION: Stable appearance of the chest.  Original Report Authenticated By: Patterson Hammersmith, M.D.    Medications: I have reviewed the patient's current  medications.  1. COPD exacerbation - She has history of sarcoidosis, continue nebulizer, steroids, Levaquin, Pulmicort. I will add ipratropium.  2. History of diastolic CHF -  shortness of breath looks more like COPD. Continue torsemide and spironolactone. Check BNP.  3. History of OSA - continue CPAP.  4. Diabetes mellitus2 - continue home medications. Lantus, SSI.  5. Hypertension - continue medications. 6-Chest pain: Probably related to COPD, EKG no ST changes. Cycle cardiac enzymes. Check dimer low probability.     LOS: 0 days   Tylea Hise M.D.  [facility] 04/22/2012, 8:38 AM

## 2012-04-23 DIAGNOSIS — J449 Chronic obstructive pulmonary disease, unspecified: Secondary | ICD-10-CM

## 2012-04-23 DIAGNOSIS — E119 Type 2 diabetes mellitus without complications: Secondary | ICD-10-CM

## 2012-04-23 LAB — BASIC METABOLIC PANEL
BUN: 17 mg/dL (ref 6–23)
Calcium: 9.3 mg/dL (ref 8.4–10.5)
GFR calc non Af Amer: 70 mL/min — ABNORMAL LOW (ref >90)
Glucose, Bld: 314 mg/dL — ABNORMAL HIGH (ref 70–99)
Sodium: 134 mEq/L — ABNORMAL LOW (ref 135–145)

## 2012-04-23 LAB — CARDIAC PANEL(CRET KIN+CKTOT+MB+TROPI)
CK, MB: 2.5 ng/mL (ref 0.3–4.0)
Relative Index: INVALID (ref 0.0–2.5)
Total CK: 55 U/L (ref 7–177)

## 2012-04-23 LAB — GLUCOSE, CAPILLARY: Glucose-Capillary: 359 mg/dL — ABNORMAL HIGH (ref 70–99)

## 2012-04-23 MED ORDER — IPRATROPIUM BROMIDE 0.02 % IN SOLN
0.5000 mg | Freq: Four times a day (QID) | RESPIRATORY_TRACT | Status: DC
  Administered 2012-04-23 – 2012-04-25 (×8): 0.5 mg via RESPIRATORY_TRACT
  Filled 2012-04-23 (×8): qty 2.5

## 2012-04-23 MED ORDER — ALBUTEROL SULFATE (5 MG/ML) 0.5% IN NEBU
2.5000 mg | INHALATION_SOLUTION | Freq: Four times a day (QID) | RESPIRATORY_TRACT | Status: DC
  Administered 2012-04-23 – 2012-04-25 (×7): 2.5 mg via RESPIRATORY_TRACT
  Filled 2012-04-23 (×7): qty 0.5

## 2012-04-23 MED ORDER — METHYLPREDNISOLONE SODIUM SUCC 40 MG IJ SOLR
40.0000 mg | Freq: Three times a day (TID) | INTRAMUSCULAR | Status: AC
  Administered 2012-04-23 – 2012-04-24 (×5): 40 mg via INTRAVENOUS
  Filled 2012-04-23 (×5): qty 1

## 2012-04-23 NOTE — Clinical Documentation Improvement (Signed)
RESPIRATORY FAILURE DOCUMENTATION CLARIFICATION QUERY   THIS DOCUMENT IS NOT A PERMANENT PART OF THE MEDICAL RECORD   Please update your documentation within the medical record to reflect your response to this query.                                                                                     04/23/12  Dr. Sunnie Nielsen and/or Associates,  In a better effort to capture your patient's severity of illness, reflect appropriate length of stay and utilization of resources, a review of the patient medical record has revealed the following indicators:  "COPD (chronic obstructive pulmonary disease)  on home O2, moderate airflow obstruction, suspect d/t emphysema  Pulmonary sarcoidosis, unimpressive CT chest 2011 Morbid obesity  Sleep apnea noncompliant w/ CPAP"  KAKRAKANDY,ARSHAD N.  04/22/2012, 5:18 AM     H&P  Home Medications Include:  albuterol  Inhaler - Inhale 2 puffs into the lungs every 6 (six) hours as needed. For wheeze or shortness of breath  albuterol nebulizer solution - Take 2.5 mg by nebulization every 6 (six) hours as needed. For wheeze or shortness of breath  budesonide-formoterol (SYMBICORT) inhaler - Inhale 2 puffs into the lungs 2 (two) times daily.  tiotropium (SPIRIVA HANDIHALER) 18 MCG inhalation capsule - Place 1 capsule into inhaler and inhale daily.  Eduard Clos  04/22/2012, 5:18 AM     H&P   Based on your clinical judgment, please document in the progress notes and discharge summary if a condition below provides greater specificity regarding the patient's respiratory status:   - Chronic Respiratory Failure   - Obesity Hypoventilation Syndrome  (OHS)   - Chronic Respiratory Failure and Obesity Hypoventilation Syndrome   - Other Condition   - Unable to Clinically Determine   In responding to this query please exercise your independent judgment.    The fact that a query is asked, does not imply that any particular answer is desired or  expected.  Reviewed: 04/26/12 - "Considering I assumed care of Ms Stanaland on 04/24/12, I feel unable to assist you with this." - email from dr. Brien Few. Query ndrgi.  Mathis Dad RN  Thank You,  Jerral Ralph  RN BSN CCDS Certified Clinical Documentation Specialist: Cell   [phone]  Health Information Management [place]  TO RESPOND TO THE THIS QUERY, FOLLOW THE INSTRUCTIONS BELOW:  1. If needed, update documentation for the patient's encounter via the notes activity.  2. Access this query again and click edit on the In [employer].  3. After updating, or not, click F2 to complete all highlighted (required) fields concerning your review. Select "additional documentation in the medical record" OR "no additional documentation provided".  4. Click Sign note button.  5. The deficiency will fall out of your In Basket *Please let us know if you are not able to complete this workflow by phone or e-mail (listed below).

## 2012-04-23 NOTE — Progress Notes (Signed)
Inpatient Diabetes Program Recommendations  AACE/ADA: New Consensus Statement on Inpatient Glycemic Control (2009)  Target Ranges:  Prepandial:   less than 140 mg/dL      Peak postprandial:   less than 180 mg/dL (1-2 hours)      Critically ill patients:  140 - 180 mg/dL   Elevated post prandials secondary to steroid therapy.   Inpatient Diabetes Program Recommendations Insulin - Meal Coverage: Novolog 4 units TID with meals   Note: Consider Novolog meal coverage insulin and titrate for control.   Will follow.  Thank you  Piedad Climes RN,BSN,CDE Inpatient Diabetes Coordinator [phone] (team pager)

## 2012-04-23 NOTE — Progress Notes (Addendum)
Subjective: Patient still complaining of SOB. She is concern with her weight.  Had some chest pain. She is complaining of headaches.  Per respiratory therapist patient has been having wheezing overnight.  Patient  Still  coughing.  Objective: Filed Vitals:   04/22/12 2000 04/22/12 2100 04/23/12 0609 04/23/12 0842  BP:  109/44 139/80   Pulse:  98 79   Temp:  97.5 F (36.4 C) 98.3 F (36.8 C)   TempSrc:  Oral Oral   Resp:  20 20   Height:      Weight:   134.6 kg (296 lb 11.8 oz)   SpO2: 98% 94% 94% 95%   Weight change: -0.209 kg (-7.4 oz)   General: Alert, awake, oriented x3, in no acute distress.  HEENT: No bruits, no goiter.  Heart: Regular rate and rhythm, without murmurs, rubs, gallops.  Lungs: no wheezes, fine crakles, bilateral air movement.  Abdomen: Soft, nontender, nondistended, positive bowel sounds.  Neuro: Grossly intact, nonfocal.   Lab Results:  [facility] 04/23/12 0100 04/22/12 0645  NA 134* 138  K 4.6 3.5  CL 96 97  CO2 30 31  GLUCOSE 314* 173*  BUN 17 14  CREATININE 0.96 0.88  CALCIUM 9.3 9.5  MG -- --  PHOS -- --    Basename 04/22/12 0645  AST 35  ALT 48*  ALKPHOS 141*  BILITOT 0.5  PROT 7.8  ALBUMIN 3.6    Basename 04/22/12 0645 04/21/12 2308  WBC 7.1 7.0  NEUTROABS 6.0 --  HGB 13.5 13.9  HCT 42.9 43.4  MCV 78.1 77.8*  PLT 191 189    Basename 04/23/12 0100 04/22/12 1704 04/22/12 0910  CKTOTAL 55 69 74  CKMB 2.5 2.0 1.8  CKMBINDEX -- -- --  TROPONINI <0.30 <0.30 <0.30    Basename 04/22/12 0910  DDIMER 0.47    Basename 04/22/12 0645  TSH 0.701  T4TOTAL --  T3FREE --  THYROIDAB --    Micro Results: Recent Results (from the past 240 hour(s))  MRSA PCR SCREENING     Status: Normal   Collection Time   04/22/12  4:50 AM      Component Value Range Status Comment   MRSA by PCR NEGATIVE  NEGATIVE Final     Studies/Results: Dg Chest 2 View  04/21/2012  *RADIOLOGY REPORT*  Clinical Data: Shortness of breath.  Chest pain.   CHEST - 2 VIEW  Comparison: 01/27/2012  Findings: Cardiac size is upper limits normal.  There are perihilar bronchitic changes and coarse interstitial markings, stable in appearance.  No focal consolidations or pleural effusions.  No edema.  IMPRESSION: Stable appearance of the chest.  Original Report Authenticated By: Patterson Hammersmith, M.D.    Medications: I have reviewed the patient's current medications.  1. COPD exacerbation - She has history of sarcoidosis, continue nebulizer every 6 hour, steroids increase dose to 40 mg IV TID. Patient with persistent wheezing overnight per report, Levaquin, Pulmicort , ipratropium.  2. History of diastolic CHF - shortness of breath looks more like COPD. Continue torsemide and spironolactone. BNP at 35.  3. History of OSA - continue CPAP.  4. Diabetes mellitus2 - continue home medications. Lantus, SSI.  5. Hypertension - continue medications.  6-Chest pain: Probably related to COPD, EKG no ST changes.  cardiac enzymes times 2 negative. D- dimer negative at 0.47.        LOS: 1 day   Addalynn Kumari M.D.  [facility] 04/23/2012, 8:50 AM  7-Headache: Non focal neuro exam. Tension  headaches.

## 2012-04-24 DIAGNOSIS — J441 Chronic obstructive pulmonary disease with (acute) exacerbation: Secondary | ICD-10-CM

## 2012-04-24 DIAGNOSIS — D869 Sarcoidosis, unspecified: Secondary | ICD-10-CM

## 2012-04-24 LAB — GLUCOSE, CAPILLARY
Glucose-Capillary: 255 mg/dL — ABNORMAL HIGH (ref 70–99)
Glucose-Capillary: 334 mg/dL — ABNORMAL HIGH (ref 70–99)

## 2012-04-24 MED ORDER — PREDNISONE 20 MG PO TABS
40.0000 mg | ORAL_TABLET | Freq: Every day | ORAL | Status: DC
  Administered 2012-04-25 – 2012-04-26 (×2): 40 mg via ORAL
  Filled 2012-04-24 (×3): qty 2

## 2012-04-24 MED ORDER — GUAIFENESIN ER 600 MG PO TB12
600.0000 mg | ORAL_TABLET | Freq: Two times a day (BID) | ORAL | Status: DC
  Administered 2012-04-24 – 2012-04-26 (×5): 600 mg via ORAL
  Filled 2012-04-24 (×6): qty 1

## 2012-04-24 MED ORDER — DOCUSATE SODIUM 100 MG PO CAPS
100.0000 mg | ORAL_CAPSULE | Freq: Two times a day (BID) | ORAL | Status: DC
  Administered 2012-04-25 – 2012-04-26 (×3): 100 mg via ORAL
  Filled 2012-04-24 (×6): qty 1

## 2012-04-24 MED ORDER — POLYETHYLENE GLYCOL 3350 17 G PO PACK
17.0000 g | PACK | Freq: Every day | ORAL | Status: DC
Start: 2012-04-24 — End: 2012-04-24
  Filled 2012-04-24: qty 1

## 2012-04-24 NOTE — Progress Notes (Signed)
[facility] PROGRESS NOTE  [name] WUJ:[id number] DOB: 03-14-1966 DOA: 04/22/2012 PCP: Rene Paci, MD  Assessment/Plan: Principal Problem:  *COPD (chronic obstructive pulmonary disease) Active Problems:  OBSTRUCTIVE SLEEP APNEA  CHF (congestive heart failure)  DM2 (diabetes mellitus, type 2)   1. COPD exacerbation:  Patient presented with cough and wheeze, consistent with infective exacerbation of COPD, against a a background of known sarcoidosis. Responding to Levaquin, now day#3, nebulizers, oxygen supplementation, and steroids. We shall add Mucinex to treatment, and start oral steroid taper form 04/25/12. .  2. History of diastolic CHF:  No clinical evidence of CHF decompensation at this time. Continue Torsemide and Spironolactone. BNP at 35.  3. History of OSA: Stable on nocturnal CPAP.  4. Diabetes mellitus 2: Continue home medications. Lantus, SSI. Adjusting as indicated. 5. Hypertension: Controlled on current medications.  6-Chest pain: Probably related to COPD, EKG no ST changes. cardiac enzymes times 2 negative. D- dimer negative at 0.47. Resolved.    Code Status: Full. Family Communication:  Disposition Plan: To be determined.   Brief narrative: 46 year old female with known history of COPD, hypertension, OSA on CPAP, sarcoidosis, diabetes mellitus 2 has had upper respiratory tract infection last week and was on Augmentin. Last 4 days patient has developed increasing shortness of breath with wheezing, and non-productive cough. Chest x-ray did not show any infiltrates. Patient on exam is wheezing and has been admitted for COPD exacerbation.    Consultants:  None  Procedures:  CXR  Antibiotics:  Levaquin from 04/22/12  HPI/Subjective: Feels better. Cough is still non-productive. Chest pain has resolved.   Objective: Vital signs in last 24 hours: Temp:  [97.5 F (36.4 C)-97.9 F (36.6 C)] 97.9 F (36.6 C) (07/31 0500) Pulse Rate:  [68-90] 68   (07/31 1032) Resp:  [21-24] 24  (07/31 0500) BP: (111-138)/(55-83) 111/55 mmHg (07/31 1032) SpO2:  [95 %-99 %] 96 % (07/31 0902) Weight:  [132.7 kg (292 lb 8.8 oz)] 132.7 kg (292 lb 8.8 oz) (07/31 0522) Weight change: -1.9 kg (-4 lb 3 oz) Last BM Date: 04/21/12  Intake/Output from previous day: 07/30 0701 - 07/31 0700 In: 1660 [P.O.:1660] Out: 4750 [Urine:4750] Total I/O In: 240 [P.O.:240] Out: 800 [Urine:800]   Physical Exam: General: Comfortable, alert, communicative, fully oriented, not short of breath at rest.  HEENT:  No clinical pallor, no jaundice, no conjunctival injection or discharge. NECK:  Supple, JVP not seen, no carotid bruits, no palpable lymphadenopathy, no palpable goiter. CHEST:  Clinically clear to auscultation, no wheezes, no crackles. HEART:  Sounds 1 and 2 heard, normal, regular, no murmurs. ABDOMEN:  Morbidly obese, soft, non-tender, no palpable organomegaly, no palpable masses, normal bowel sounds. GENITALIA:  Not examined. LOWER EXTREMITIES:  Minimal pitting edema, palpable peripheral pulses. MUSCULOSKELETAL SYSTEM:  Generalized osteoarthritic changes, otherwise, normal. CENTRAL NERVOUS SYSTEM:  No focal neurologic deficit on gross examination.  Lab Results:  Basename 04/22/12 0645 04/21/12 2308  WBC 7.1 7.0  HGB 13.5 13.9  HCT 42.9 43.4  PLT 191 189    Basename 04/23/12 0100 04/22/12 0645  NA 134* 138  K 4.6 3.5  CL 96 97  CO2 30 31  GLUCOSE 314* 173*  BUN 17 14  CREATININE 0.96 0.88  CALCIUM 9.3 9.5   Recent Results (from the past 240 hour(s))  MRSA PCR SCREENING     Status: Normal   Collection Time   04/22/12  4:50 AM      Component Value Range Status Comment   MRSA by PCR  NEGATIVE  NEGATIVE Final      Studies/Results: No results found.  Medications: Scheduled Meds:   . albuterol  2.5 mg Nebulization Q6H  . amLODipine  5 mg Oral Daily  . aspirin EC  81 mg Oral Daily  . budesonide  0.5 mg Nebulization BID  . enoxaparin  (LOVENOX) injection  70 mg Subcutaneous Q24H  . fluticasone  1 spray Each Nare Daily  . glimepiride  1 mg Oral QAC breakfast  . insulin aspart  0-9 Units Subcutaneous TID WC  . insulin glargine  8-16 Units Subcutaneous QHS  . ipratropium  0.5 mg Nebulization Q6H  . levofloxacin (LEVAQUIN) IV  750 mg Intravenous Q24H  . loratadine  10 mg Oral Daily  . methylPREDNISolone (SOLU-MEDROL) injection  40 mg Intravenous TID  . pantoprazole  40 mg Oral BID AC  . potassium chloride SA  20 mEq Oral BID  . sertraline  25 mg Oral Daily  . sodium chloride  3 mL Intravenous Q12H  . sodium chloride  3 mL Intravenous Q12H  . spironolactone  25 mg Oral Daily  . torsemide  100 mg Oral BID WC   Continuous Infusions:  PRN Meds:.acetaminophen, acetaminophen, albuterol, ALPRAZolam, cyclobenzaprine, guaiFENesin-dextromethorphan, ondansetron (ZOFRAN) IV, ondansetron, oxyCODONE    LOS: 2 days   [name],CHRISTOPHER  [facility] Pager [phone]. If 8PM-8AM, please contact night-coverage at [email or web address], password [facility] 04/24/2012, 11:11 AM  LOS: 2 days

## 2012-04-25 LAB — CBC
Hemoglobin: 14.7 g/dL (ref 12.0–15.0)
MCH: 25.8 pg — ABNORMAL LOW (ref 26.0–34.0)
MCHC: 33.7 g/dL (ref 30.0–36.0)
Platelets: 164 10*3/uL (ref 150–400)

## 2012-04-25 LAB — GLUCOSE, CAPILLARY
Glucose-Capillary: 266 mg/dL — ABNORMAL HIGH (ref 70–99)
Glucose-Capillary: 261 mg/dL — ABNORMAL HIGH (ref 70–99)

## 2012-04-25 LAB — BASIC METABOLIC PANEL
Calcium: 9.7 mg/dL (ref 8.4–10.5)
GFR calc Af Amer: >90 mL/min (ref >90)
GFR calc non Af Amer: 78 mL/min — ABNORMAL LOW (ref >90)
Potassium: 4.7 mEq/L (ref 3.5–5.1)
Sodium: 134 mEq/L — ABNORMAL LOW (ref 135–145)

## 2012-04-25 MED ORDER — FLUCONAZOLE 150 MG PO TABS
150.0000 mg | ORAL_TABLET | Freq: Once | ORAL | Status: AC
  Administered 2012-04-25: 150 mg via ORAL
  Filled 2012-04-25: qty 1

## 2012-04-25 MED ORDER — IPRATROPIUM BROMIDE 0.02 % IN SOLN
0.5000 mg | Freq: Three times a day (TID) | RESPIRATORY_TRACT | Status: DC
  Administered 2012-04-25 – 2012-04-26 (×2): 0.5 mg via RESPIRATORY_TRACT
  Filled 2012-04-25 (×3): qty 2.5

## 2012-04-25 MED ORDER — BENZONATATE 100 MG PO CAPS
200.0000 mg | ORAL_CAPSULE | Freq: Three times a day (TID) | ORAL | Status: DC
  Administered 2012-04-25 – 2012-04-26 (×3): 200 mg via ORAL
  Filled 2012-04-25 (×5): qty 2

## 2012-04-25 MED ORDER — INSULIN GLARGINE 100 UNIT/ML ~~LOC~~ SOLN
20.0000 [IU] | Freq: Every day | SUBCUTANEOUS | Status: DC
  Administered 2012-04-25: 20 [IU] via SUBCUTANEOUS

## 2012-04-25 MED ORDER — ALBUTEROL SULFATE (5 MG/ML) 0.5% IN NEBU
2.5000 mg | INHALATION_SOLUTION | Freq: Three times a day (TID) | RESPIRATORY_TRACT | Status: DC
  Administered 2012-04-25 – 2012-04-26 (×3): 2.5 mg via RESPIRATORY_TRACT
  Filled 2012-04-25 (×4): qty 0.5

## 2012-04-25 NOTE — Progress Notes (Signed)
Inpatient Diabetes Program Recommendations  AACE/ADA: New Consensus Statement on Inpatient Glycemic Control (2009)  Target Ranges:  Prepandial:   less than 140 mg/dL      Peak postprandial:   less than 180 mg/dL (1-2 hours)      Critically ill patients:  140 - 180 mg/dL   Results for [name], [name] (MRN [id number]) as of 04/25/2012 11:48  Ref. Range 04/24/2012 11:10 04/24/2012 16:17 04/24/2012 21:00 04/25/2012 06:16 04/25/2012 10:53  Glucose-Capillary Latest Range: 70-99 mg/dL 469 (H) 629 (H) 528 (H) 266 (H) 318 (H)    Inpatient Diabetes Program Recommendations Insulin - Basal: Increase Lantus to 20 units at HS Insulin - Meal Coverage: Novolog 4 units TID with meals   Thank you  Piedad Climes RN,BSN,CDE Inpatient Diabetes Coordinator [phone] (team pager)

## 2012-04-25 NOTE — Progress Notes (Signed)
   CARE MANAGEMENT NOTE 04/25/2012  Patient:  [name], [name]   Account Number:  [id number]  Date Initiated:  04/25/2012  Documentation initiated by:  Tera Mater  Subjective/Objective Assessment:   46yo female admitted with COPD exacerbation.  Pt. lives at hom with friend.     Action/Plan:   Discharge planning for [facility] services and DME need   Anticipated DC Date:  04/27/2012   Anticipated DC Plan:  HOME W HOME HEALTH SERVICES      DC Planning Services  CM consult      Choice offered to / List presented to:             Status of service:  In process, will continue to follow Medicare Important Message given?   (If response is "NO", the following Medicare IM given date fields will be blank) Date Medicare IM given:   Date Additional Medicare IM given:    Discharge Disposition:    Per UR Regulation:  Reviewed for med. necessity/level of care/duration of stay  If discussed at Long Length of Stay Meetings, dates discussed:    Comments:  04/25/12 1403 Tera Mater, RN, BSN NCM [phone] Pt. has hx of Sarcoidosis.  Currently admitted with infected exacerbation of COPD treated with IV Levaquin Day #3. Pt. is requiring oxygen supplementation and may require home continuous oxygen.  NCM will continue to follow for discharge needs.

## 2012-04-25 NOTE — Progress Notes (Signed)
[facility] PROGRESS NOTE  [name] RUE:[id number] DOB: 03-20-1966 DOA: 04/22/2012 PCP: Rene Paci, MD  Assessment/Plan: Principal Problem:  *COPD (chronic obstructive pulmonary disease) Active Problems:  OBSTRUCTIVE SLEEP APNEA  CHF (congestive heart failure)  DM2 (diabetes mellitus, type 2)   1. COPD exacerbation:  Patient presented with cough and wheeze, consistent with infective exacerbation of COPD, against a a background of known sarcoidosis. Responding to Levaquin, now day#4, nebulizers, oxygen supplementation, and steroids. We added Mucinex to treatment on 04/24/12, but as patient is still troubled by cough, will treat with tessalon. Staredt oral steroid taper form today. Patient feels considerably better.  2. History of diastolic CHF:  No clinical evidence of CHF decompensation at this time. Continue Torsemide and Spironolactone. BNP at 35.  3. History of OSA: Stable on nocturnal CPAP.  4. Diabetes mellitus 2: Continue home medications. Lantus, SSI. Adjusting as indicated. Patient has  "bump" in CBGs, secondary to steroids. Improvement is anticipated with steroid taper.  5. Hypertension: Controlled on current medications.  6. Chest pain: Probably related to COPD, EKG no ST changes. cardiac enzymes times 2 negative. D- dimer negative at 0.47. Resolved. 7. Vulvo-vaginal candidiasis: Likely secondary to antibiotics/steroids and DM. Will treat with Diflucan 150 mg x 1 dose.     Code Status: Full. Family Communication:  Disposition Plan: To be determined.   Brief narrative: 46 year old female with known history of COPD, hypertension, OSA on CPAP, sarcoidosis, diabetes mellitus 2 has had upper respiratory tract infection last week and was on Augmentin. Last 4 days patient has developed increasing shortness of breath with wheezing, and non-productive cough. Chest x-ray did not show any infiltrates. Patient on exam is wheezing and has been admitted for COPD  exacerbation.    Consultants:  None  Procedures:  CXR  Antibiotics:  Levaquin from 04/22/12  HPI/Subjective: Feels much better. Still has troublesome cough. Has pruritus vulvae.   Objective: Vital signs in last 24 hours: Temp:  [97.3 F (36.3 C)-98.2 F (36.8 C)] 98.2 F (36.8 C) (08/01 1407) Pulse Rate:  [77-94] 83  (08/01 1407) Resp:  [19-20] 19  (08/01 1407) BP: (129-144)/(71-91) 129/80 mmHg (08/01 1407) SpO2:  [93 %-97 %] 95 % (08/01 1407) Weight:  [131.452 kg (289 lb 12.8 oz)] 131.452 kg (289 lb 12.8 oz) (08/01 0520) Weight change: -1.248 kg (-2 lb 12 oz) Last BM Date: 04/24/12  Intake/Output from previous day: 07/31 0701 - 08/01 0700 In: 1203 [P.O.:1200; I.V.:3] Out: 5250 [Urine:5250] Total I/O In: 580 [P.O.:580] Out: 1125 [Urine:1125]   Physical Exam: General: Comfortable, alert, communicative, fully oriented, not short of breath at rest.  HEENT:  No clinical pallor, no jaundice, no conjunctival injection or discharge. NECK:  Supple, JVP not seen, no carotid bruits, no palpable lymphadenopathy, no palpable goiter. CHEST:  Occasional wheeze, no crackles. HEART:  Sounds 1 and 2 heard, normal, regular, no murmurs. ABDOMEN:  Morbidly obese, soft, non-tender, no palpable organomegaly, no palpable masses, normal bowel sounds. GENITALIA:  Not examined. LOWER EXTREMITIES:  Minimal pitting edema, palpable peripheral pulses. MUSCULOSKELETAL SYSTEM:  Generalized osteoarthritic changes, otherwise, normal. CENTRAL NERVOUS SYSTEM:  No focal neurologic deficit on gross examination.  Lab Results:  [facility] 04/25/12 0612  WBC 12.1*  HGB 14.7  HCT 43.6  PLT 164    Basename 04/25/12 0612 04/23/12 0100  NA 134* 134*  K 4.7 4.6  CL 93* 96  CO2 30 30  GLUCOSE 287* 314*  BUN 27* 17  CREATININE 0.88 0.96  CALCIUM 9.7 9.3  Recent Results (from the past 240 hour(s))  MRSA PCR SCREENING     Status: Normal   Collection Time   04/22/12  4:50 AM      Component  Value Range Status Comment   MRSA by PCR NEGATIVE  NEGATIVE Final      Studies/Results: No results found.  Medications: Scheduled Meds:    . albuterol  2.5 mg Nebulization TID  . amLODipine  5 mg Oral Daily  . aspirin EC  81 mg Oral Daily  . budesonide  0.5 mg Nebulization BID  . docusate sodium  100 mg Oral BID  . enoxaparin (LOVENOX) injection  70 mg Subcutaneous Q24H  . fluticasone  1 spray Each Nare Daily  . glimepiride  1 mg Oral QAC breakfast  . guaiFENesin  600 mg Oral BID  . insulin aspart  0-9 Units Subcutaneous TID WC  . insulin glargine  8-16 Units Subcutaneous QHS  . ipratropium  0.5 mg Nebulization TID  . levofloxacin (LEVAQUIN) IV  750 mg Intravenous Q24H  . loratadine  10 mg Oral Daily  . methylPREDNISolone (SOLU-MEDROL) injection  40 mg Intravenous TID  . pantoprazole  40 mg Oral BID AC  . potassium chloride SA  20 mEq Oral BID  . predniSONE  40 mg Oral Q breakfast  . sertraline  25 mg Oral Daily  . sodium chloride  3 mL Intravenous Q12H  . sodium chloride  3 mL Intravenous Q12H  . spironolactone  25 mg Oral Daily  . torsemide  100 mg Oral BID WC  . DISCONTD: albuterol  2.5 mg Nebulization Q6H  . DISCONTD: ipratropium  0.5 mg Nebulization Q6H  . DISCONTD: polyethylene glycol  17 g Oral Daily   Continuous Infusions:  PRN Meds:.acetaminophen, acetaminophen, albuterol, ALPRAZolam, cyclobenzaprine, guaiFENesin-dextromethorphan, ondansetron (ZOFRAN) IV, ondansetron, oxyCODONE    LOS: 3 days   Edris Schneck,CHRISTOPHER  [facility] Pager [phone]. If 8PM-8AM, please contact night-coverage at [email or web address], password [facility] 04/25/2012, 2:58 PM  LOS: 3 days

## 2012-04-25 NOTE — Progress Notes (Signed)
Cardiac monitoring D/C'ed per previous MD order. Pt has met criteria for tele D/C.

## 2012-04-25 NOTE — Discharge Summary (Signed)
Physician Discharge Summary  [name] JXB:[id number] DOB: 1966-06-10 DOA: 04/22/2012  PCP: Rene Paci, MD  Admit date: 04/22/2012 Discharge date: 04/26/2012  Recommendations for Outpatient Follow-up:  1. Follow up with primary MD.  Discharge Diagnoses:  Principal Problem:  *COPD (chronic obstructive pulmonary disease) Active Problems:  OBSTRUCTIVE SLEEP APNEA  CHF (congestive heart failure)  DM2 (diabetes mellitus, type 2)  Vaginal candidiasis   Discharge Condition: Satisfactory.  Diet recommendation: Heart-healthy/Carbohydrate-modified.   Wt Readings from Last 3 Encounters:  04/26/12 129.865 kg (286 lb 4.8 oz)  04/10/12 130.237 kg (287 lb 1.9 oz)  02/13/12 135.68 kg (299 lb 1.9 oz)    History of present illness:  46 year old female with known history of COPD, hypertension, OSA on CPAP, sarcoidosis, diabetes mellitus 2 has had upper respiratory tract infection last week and was on Augmentin. Last 4 days patient has developed increasing shortness of breath with wheezing, and non-productive cough. Chest x-ray did not show any infiltrates. Patient on exam is wheezing and has been admitted for COPD exacerbation.    Hospital Course:  1. COPD exacerbation: Patient presented with cough and wheeze, consistent with infective exacerbation of COPD, against a background of known sarcoidosis. She responded to Levaquin, as well as nebulizers, oxygen supplementation, and steroids. Mucinex and Tessalon prove useful for symptomatic control of her cough and as of 04/25/12, patient felt considerably better. Oral steroid taper was commenced on 04/25/12, and antibiotic treatment will be concluded on 04/28/12. 2. History of diastolic CHF: No clinical evidence of CHF decompensation was elicited during this hospitalization. BNP was 35, and pre-admission Torsemide and Spironolactone were continued.  3. History of OSA: Stable on nocturnal CPAP.  4. Diabetes mellitus 2: Managed with diet, Lantus and SSI,  adjusted as indicated. Patient had "bump" in CBGs, secondary to steroids but this has improved with steroid taper.  5. Hypertension: Controlled on current medications.  6. Chest pain: This was initially noted on admission, and was probably related to COPD. EKG showed no ST change, cardiac enzymes remained un-elevated and D-Dimer was negative at 0.47. Resolved.  7. Vulvo-vaginal candidiasis: Patient had pruritus vulvae on 04/25/12. Likely secondary to antibiotics/steroids and DM. Treated with Diflucan 150 mg x 1 dose.     Procedures:See below.   Consultations:  N/A.  Discharge Exam: Filed Vitals:   04/26/12 0548  BP: 130/83  Pulse: 77  Temp: 97.3 F (36.3 C)  Resp: 20   Filed Vitals:   04/25/12 1407 04/25/12 2141 04/26/12 0548 04/26/12 0829  BP: 129/80 147/88 130/83   Pulse: 83 88 77   Temp: 98.2 F (36.8 C) 97.5 F (36.4 C) 97.3 F (36.3 C)   TempSrc: Oral Oral Oral   Resp: 19 20 20    Height:      Weight:   129.865 kg (286 lb 4.8 oz)   SpO2: 95% 97% 99% 94%   General: Comfortable, alert, communicative, fully oriented, not short of breath at rest.  HEENT: No clinical pallor, no jaundice, no conjunctival injection or discharge.  NECK: Supple, JVP not seen, no carotid bruits, no palpable lymphadenopathy, no palpable goiter.  CHEST: Occasional wheeze, no crackles.  HEART: Sounds 1 and 2 heard, normal, regular, no murmurs.  ABDOMEN: Morbidly obese, soft, non-tender, no palpable organomegaly, no palpable masses, normal bowel sounds.  GENITALIA: Not examined.  LOWER EXTREMITIES: Minimal pitting edema, palpable peripheral pulses.  MUSCULOSKELETAL SYSTEM: Generalized osteoarthritic changes, otherwise, normal.  CENTRAL NERVOUS SYSTEM: No focal neurologic deficit on gross examination.   Discharge Instructions  Discharge  Orders    Future Appointments: Provider: Department: Dept Phone: Center:   05/24/2012 1:30 PM Barbaraann Share, MD [facility] [phone] None     Future  Orders Please Complete By Expires   Diet - low sodium heart healthy      Increase activity slowly        Medication List  As of 04/26/2012 11:14 AM   STOP taking these medications         amoxicillin-clavulanate 875-125 MG per tablet      fluconazole 150 MG tablet         TAKE these medications         albuterol 108 (90 BASE) MCG/ACT inhaler   Commonly known as: PROVENTIL HFA;VENTOLIN HFA   Inhale 2 puffs into the lungs every 6 (six) hours as needed. For wheeze or shortness of breath      albuterol (2.5 MG/3ML) 0.083% nebulizer solution   Commonly known as: PROVENTIL   Take 2.5 mg by nebulization every 6 (six) hours as needed. For wheeze or shortness of breath      ALPRAZolam 1 MG tablet   Commonly known as: XANAX   Take 1 mg by mouth 2 (two) times daily as needed. For anxiety      amLODipine 5 MG tablet   Commonly known as: NORVASC   Take 1 tablet (5 mg total) by mouth daily.      aspirin 81 MG EC tablet   Take 81 mg by mouth daily.      benzonatate 200 MG capsule   Commonly known as: TESSALON   Take 1 capsule (200 mg total) by mouth 3 (three) times daily.      budesonide-formoterol 160-4.5 MCG/ACT inhaler   Commonly known as: SYMBICORT   Inhale 2 puffs into the lungs 2 (two) times daily.      cetirizine 10 MG tablet   Commonly known as: ZYRTEC   Take 10 mg by mouth at bedtime.      cyclobenzaprine 10 MG tablet   Commonly known as: FLEXERIL   Take 10 mg by mouth 3 (three) times daily as needed. For pain      esomeprazole 40 MG capsule   Commonly known as: NEXIUM   Take 40 mg by mouth daily before breakfast.      glimepiride 1 MG tablet   Commonly known as: AMARYL   Take 1 tablet (1 mg total) by mouth daily before breakfast.      guaiFENesin 600 MG 12 hr tablet   Commonly known as: MUCINEX   Take 1 tablet (600 mg total) by mouth 2 (two) times daily.      insulin glargine 100 UNIT/ML injection   Commonly known as: LANTUS   Inject 8-16 Units into the skin  at bedtime. If blood sugar is over 100, patient uses 16 units. If under 100, patient uses 8 units. Last dose was 8 units Saturday night 04/20/12      levofloxacin 500 MG tablet   Commonly known as: LEVAQUIN   Take 1 tablet (500 mg total) by mouth daily.   Start taking on: 04/27/2012      mometasone 50 MCG/ACT nasal spray   Commonly known as: NASONEX   Place 2 sprays into the nose daily.      Oxycodone HCl 10 MG Tabs   Take 0.5-1 tablets (5-10 mg total) by mouth 3 (three) times daily as needed. For pain      potassium chloride SA 20 MEQ tablet  Commonly known as: K-DUR,KLOR-CON   Take 20 mEq by mouth 2 (two) times daily.      predniSONE 10 MG tablet   Commonly known as: DELTASONE   From 04/27/12, take 30 mg daily for 3 days, then 20 mg daily for 3 days, then 10 mg daily for 3 days, then stop.      promethazine 25 MG tablet   Commonly known as: PHENERGAN   Take 1 tablet (25 mg total) by mouth every 6 (six) hours as needed. For nausea      sertraline 25 MG tablet   Commonly known as: ZOLOFT   Take 25 mg by mouth daily.      spironolactone 25 MG tablet   Commonly known as: ALDACTONE   Take 1 by mouth daily      tiotropium 18 MCG inhalation capsule   Commonly known as: SPIRIVA   Place 1 capsule (18 mcg total) into inhaler and inhale daily.      torsemide 100 MG tablet   Commonly known as: DEMADEX   Take 1 tablet (100 mg total) by mouth 2 (two) times daily with a meal.           Follow-up Information    Follow up with Rene Paci, MD. (As needed)    Contact information:   [address] [facility] [address] [place] [address] [phone]       Call Barbaraann Share, MD.   Contact information:   [address] [employer] [place] [address] [phone]           The results of significant diagnostics from this hospitalization (including imaging, microbiology, ancillary and laboratory) are listed below  for reference.    Significant Diagnostic Studies: Dg Chest 2 View  04/21/2012  *RADIOLOGY REPORT*  Clinical Data: Shortness of breath.  Chest pain.  CHEST - 2 VIEW  Comparison: 01/27/2012  Findings: Cardiac size is upper limits normal.  There are perihilar bronchitic changes and coarse interstitial markings, stable in appearance.  No focal consolidations or pleural effusions.  No edema.  IMPRESSION: Stable appearance of the chest.  Original Report Authenticated By: Patterson Hammersmith, M.D.    Microbiology: Recent Results (from the past 240 hour(s))  MRSA PCR SCREENING     Status: Normal   Collection Time   04/22/12  4:50 AM      Component Value Range Status Comment   MRSA by PCR NEGATIVE  NEGATIVE Final      Labs: Basic Metabolic Panel:  Lab 04/26/12 5784 04/25/12 0612 04/23/12 0100 04/22/12 0645 04/21/12 2308  NA 138 134* 134* 138 135  K 3.6 4.7 4.6 3.5 3.5  CL 94* 93* 96 97 96  CO2 37* 30 30 31 30   GLUCOSE 106* 287* 314* 173* 137*  BUN 25* 27* 17 14 13   CREATININE 1.08 0.88 0.96 0.88 0.98  CALCIUM 9.0 9.7 9.3 9.5 9.2  MG -- -- -- -- --  PHOS -- -- -- -- --   Liver Function Tests:  Lab 04/22/12 0645  AST 35  ALT 48*  ALKPHOS 141*  BILITOT 0.5  PROT 7.8  ALBUMIN 3.6   No results found for this basename: LIPASE:5,AMYLASE:5 in the last 168 hours No results found for this basename: AMMONIA:5 in the last 168 hours CBC:  Lab 04/26/12 0530 04/25/12 0612 04/22/12 0645 04/21/12 2308  WBC 12.2* 12.1* 7.1 7.0  NEUTROABS -- -- 6.0 --  HGB 15.0  14.7 13.5 13.9  HCT 45.8 43.6 42.9 43.4  MCV 77.0* 76.6* 78.1 77.8*  PLT 205 164 191 189   Cardiac Enzymes:  Lab 04/23/12 0100 04/22/12 1704 04/22/12 0910  CKTOTAL 55 69 74  CKMB 2.5 2.0 1.8  CKMBINDEX -- -- --  TROPONINI <0.30 <0.30 <0.30   BNP: BNP (last 3 results)  Basename 04/22/12 0910 12/22/11 1414 12/14/11 0654  PROBNP 35.5 30.2 87.0   CBG:  Lab 04/26/12 1054 04/26/12 0647 04/25/12 2144 04/25/12 1659 04/25/12 1053   GLUCAP 165* 101* 215* 261* 318*    Time coordinating discharge: 35 minutes  Signed:  Mitchell Iwanicki,CHRISTOPHER  [facility] 04/26/2012, 11:14 AM

## 2012-04-25 NOTE — Progress Notes (Signed)
Patient states she will place CPAP on herself when she gets ready for it. Instructed to call if she needs anything. RT will continue to monitor.

## 2012-04-25 NOTE — Progress Notes (Signed)
Utilization review completed.  

## 2012-04-26 ENCOUNTER — Telehealth: Payer: Self-pay | Admitting: Pulmonary Disease

## 2012-04-26 DIAGNOSIS — B373 Candidiasis of vulva and vagina: Secondary | ICD-10-CM

## 2012-04-26 LAB — BASIC METABOLIC PANEL
BUN: 25 mg/dL — ABNORMAL HIGH (ref 6–23)
Creatinine, Ser: 1.08 mg/dL (ref 0.50–1.10)
GFR calc non Af Amer: 61 mL/min — ABNORMAL LOW (ref >90)
Glucose, Bld: 106 mg/dL — ABNORMAL HIGH (ref 70–99)
Potassium: 3.6 mEq/L (ref 3.5–5.1)

## 2012-04-26 LAB — CBC
HCT: 45.8 % (ref 36.0–46.0)
Hemoglobin: 15 g/dL (ref 12.0–15.0)
MCH: 25.2 pg — ABNORMAL LOW (ref 26.0–34.0)
MCHC: 32.8 g/dL (ref 30.0–36.0)
MCV: 77 fL — ABNORMAL LOW (ref 78.0–100.0)
RDW: 17.4 % — ABNORMAL HIGH (ref 11.5–15.5)

## 2012-04-26 LAB — GLUCOSE, CAPILLARY: Glucose-Capillary: 165 mg/dL — ABNORMAL HIGH (ref 70–99)

## 2012-04-26 MED ORDER — GUAIFENESIN ER 600 MG PO TB12: 600.0000 mg | ORAL_TABLET | Freq: Two times a day (BID) | ORAL | Status: DC

## 2012-04-26 MED ORDER — BENZONATATE 200 MG PO CAPS: 200.0000 mg | ORAL_CAPSULE | Freq: Three times a day (TID) | ORAL | Status: AC

## 2012-04-26 MED ORDER — PREDNISONE 10 MG PO TABS: ORAL_TABLET | ORAL | Status: DC

## 2012-04-26 MED ORDER — LEVOFLOXACIN 500 MG PO TABS: 500.0000 mg | ORAL_TABLET | Freq: Every day | ORAL | Status: AC

## 2012-04-26 NOTE — Progress Notes (Signed)
Pt A/O, pt D/C's to home, left via wheelchair, all D/C instructions and med list given to pt, pt verbalized understanding, pt left with no s/s of distress.

## 2012-04-26 NOTE — Telephone Encounter (Signed)
Spoke with pt. She states that [facility] advised her this am that she would need to see him for HFU within a wk. [facility] does not have anything and I advised that she can see TP. She states only wants to see [facility]. First opening for HFU slot with [facility] not until 05-28-12. Is it okay for her to wait until then or do you want to overbook? Please advise, thanks!

## 2012-04-26 NOTE — Telephone Encounter (Signed)
Stick her in somewhere except Monday afternoon, at the end of the day.

## 2012-04-26 NOTE — Telephone Encounter (Signed)
Lawson Fiscal, I am sending this to you so you can advise best spot to put her at. Just let me know and I will be happy to schedule this, thanks!

## 2012-04-27 ENCOUNTER — Other Ambulatory Visit: Payer: Self-pay | Admitting: Internal Medicine

## 2012-04-29 ENCOUNTER — Encounter (HOSPITAL_COMMUNITY): Payer: Self-pay | Admitting: Emergency Medicine

## 2012-04-29 ENCOUNTER — Emergency Department (INDEPENDENT_AMBULATORY_CARE_PROVIDER_SITE_OTHER)
Admission: EM | Admit: 2012-04-29 | Discharge: 2012-04-29 | Disposition: A | Discharge status: Home / Self Care | Payer: Medicare Other | Source: Home / Self Care | Attending: Emergency Medicine | Admitting: Emergency Medicine

## 2012-04-29 DIAGNOSIS — J449 Chronic obstructive pulmonary disease, unspecified: Secondary | ICD-10-CM

## 2012-04-29 DIAGNOSIS — R5381 Other malaise: Secondary | ICD-10-CM

## 2012-04-29 DIAGNOSIS — R5383 Other fatigue: Secondary | ICD-10-CM

## 2012-04-29 DIAGNOSIS — E119 Type 2 diabetes mellitus without complications: Secondary | ICD-10-CM

## 2012-04-29 LAB — POCT I-STAT, CHEM 8
BUN: 17 mg/dL (ref 6–23)
Calcium, Ion: 1.08 mmol/L — ABNORMAL LOW (ref 1.12–1.23)
Creatinine, Ser: 1 mg/dL (ref 0.50–1.10)
Hemoglobin: 17 g/dL — ABNORMAL HIGH (ref 12.0–15.0)
Sodium: 135 mEq/L (ref 135–145)
TCO2: 29 mmol/L (ref 0–100)

## 2012-04-29 MED ORDER — GLUCOSE BLOOD VI STRP: ORAL_STRIP | Status: DC

## 2012-04-29 NOTE — ED Notes (Signed)
Reports blurred vision and dizziness.  Patient was in-patient for copd exacerbation.  Discharged home on 04/26/2012.  Reported starting to feel bad when she arrived home.  Unable to check cbg at home.  Patient does not have test strips for this equipment.

## 2012-04-29 NOTE — ED Provider Notes (Signed)
Chief Complaint  Patient presents with  . Blurred Vision    History of Present Illness:   [name] is a 46 year old female with COPD, hypertension, and diabetes. She was hospitalized last week at [facility] because of exacerbation of her COPD. She was hospitalized for 6 days, being discharged 4 days ago. She was treated with IV Solu-Medrol and breathing treatments. She did not have to be put on a ventilator. While in the hospital her blood sugars were high, around 300 and since her discharge she's had no further symptoms of COPD. She's been unable to check her CBGs since she is out of strips. Since her discharge she's felt dizzy, had headaches, blurry vision, and felt tired. She's not sure whether her sugars are high or low. She called her primary care physician today and for some reason they're unable to see her, to check her blood sugar, or provide her with strips to check her blood sugars with a home. Currently she is on Amaryl 1 mg per day and Lantus insulin 16 units at bedtime. Usually her blood sugars were in the 100s. She denies polyuria, polydipsia, or dry mouth. She's had no fever, chills, shortness of breath, chest pain, abdominal pain, nausea, vomiting, urinary symptoms, or neurological complaints.  Review of Systems:  Other than noted above, the patient denies any of the following symptoms. Systemic:  No fever, chills, sweats, fatigue, myalgias, headache, or anorexia. Eye:  No redness, pain or drainage. ENT:  No earache, nasal congestion, rhinorrhea, sinus pressure, or sore throat. Lungs:  No cough, sputum production, wheezing, shortness of breath.  Cardiovascular:  No chest pain, palpitations, or syncope. GI:  No nausea, vomiting, abdominal pain or diarrhea. GU:  No dysuria, frequency, or hematuria. Skin:  No rash or pruritis.  PMFSH:  Past medical history, family history, social history, meds, and allergies were reviewed.   Physical Exam:   Vital signs:  BP 123/82   Pulse 80  Temp 98.1 F (36.7 C) (Oral)  Resp 16  SpO2 91%  LMP 12/28/2011 General:  Alert, in no distress. Eye:  PERRL, full EOMs.  Lids and conjunctivas were normal. Fundi were benign. ENT:  TMs and canals were normal, without erythema or inflammation.  Nasal mucosa was clear and uncongested, without drainage.  Mucous membranes were moist.  Pharynx was clear, without exudate or drainage.  There were no oral ulcerations or lesions. Neck:  Supple, no adenopathy, tenderness or mass. Thyroid was normal. Lungs:  No respiratory distress.  Lungs were clear to auscultation, without wheezes, rales or rhonchi.  Breath sounds were clear and equal bilaterally. Heart:  Regular rhythm, without gallops, murmers or rubs. Abdomen:  Soft, flat, and non-tender to palpation.  No hepatosplenomagaly or mass. Skin:  Clear, warm, and dry, without rash or lesions.  Labs:   Results for orders placed during the hospital encounter of 04/29/12  POCT I-STAT, CHEM 8      Component Value Range   Sodium 135  135 - 145 mEq/L   Potassium 3.9  3.5 - 5.1 mEq/L   Chloride 94 (*) 96 - 112 mEq/L   BUN 17  6 - 23 mg/dL   Creatinine, Ser 4.78  0.50 - 1.10 mg/dL   Glucose, Bld 295 (*) 70 - 99 mg/dL   Calcium, Ion 6.21 (*) 1.12 - 1.23 mmol/L   TCO2 29  0 - 100 mmol/L   Hemoglobin 17.0 (*) 12.0 - 15.0 g/dL   HCT 30.8 (*) 65.7 - 84.6 %  Assessment:  The primary encounter diagnosis was DM2 (diabetes mellitus, type 2). Diagnoses of COPD (chronic obstructive pulmonary disease) and Fatigue were also pertinent to this visit. Her blood sugar is under fairly good control right now at 176. I think her symptoms are probably due to all the medication she got while she was in the hospital plus the prednisone that she still taking as an outpatient. I cannot find any other explanation for her symptoms. She was told to followup with Dr. Shelle Iron next week, and with her primary care physician in 2 weeks. I provided her with a refill for her  blood sugar test strips.  Plan:   1.  The following meds were prescribed:   New Prescriptions   No medications on file   2.  The patient was instructed in symptomatic care and handouts were given. 3.  The patient was told to return if becoming worse in any way, if no better in 3 or 4 days, and given some red flag symptoms that would indicate earlier return.    Reuben Likes, MD 04/29/12 2139

## 2012-04-30 NOTE — Telephone Encounter (Signed)
Lawson Fiscal, have you had a chance to look at [facility] schedule yet?  Please advise. Thank you!

## 2012-04-30 NOTE — Telephone Encounter (Signed)
Called pt at # provided above - received msg "the person you have called is unavailable right now.  Try your call again later."  No option to leave msg.  WCB

## 2012-04-30 NOTE — Telephone Encounter (Signed)
Can use any 4:30 pm RN slot except on Mondays.

## 2012-05-01 NOTE — Telephone Encounter (Signed)
Pt returned call. I put her in the 05-08-12 slot at 4:30. Nothing further needed per pt and I will close this encounter. [name]

## 2012-05-01 NOTE — Telephone Encounter (Signed)
I called pt cell # and was advised "the person you have called is unavailable right now please try your call again later". I called home # listed and was NA and phone keep ringing multiple times w/o a response and never received a VM. I called pt sister Jacqueline's # and lmomtcb x1 to see if she can reach pt

## 2012-05-08 ENCOUNTER — Encounter: Payer: Self-pay | Admitting: Pulmonary Disease

## 2012-05-08 ENCOUNTER — Ambulatory Visit (INDEPENDENT_AMBULATORY_CARE_PROVIDER_SITE_OTHER): Payer: Medicare Other | Admitting: Pulmonary Disease

## 2012-05-08 ENCOUNTER — Other Ambulatory Visit (INDEPENDENT_AMBULATORY_CARE_PROVIDER_SITE_OTHER): Payer: Medicare Other

## 2012-05-08 VITALS — BP 82/48 | HR 92 | Temp 98.4°F | Ht 68.0 in | Wt 289.6 lb

## 2012-05-08 DIAGNOSIS — G4733 Obstructive sleep apnea (adult) (pediatric): Secondary | ICD-10-CM

## 2012-05-08 DIAGNOSIS — I509 Heart failure, unspecified: Secondary | ICD-10-CM

## 2012-05-08 DIAGNOSIS — R300 Dysuria: Secondary | ICD-10-CM

## 2012-05-08 DIAGNOSIS — I5032 Chronic diastolic (congestive) heart failure: Secondary | ICD-10-CM

## 2012-05-08 DIAGNOSIS — J9610 Chronic respiratory failure, unspecified whether with hypoxia or hypercapnia: Secondary | ICD-10-CM

## 2012-05-08 DIAGNOSIS — J449 Chronic obstructive pulmonary disease, unspecified: Secondary | ICD-10-CM

## 2012-05-08 LAB — URINALYSIS, ROUTINE W REFLEX MICROSCOPIC
Bilirubin Urine: NEGATIVE
Hgb urine dipstick: NEGATIVE
Ketones, ur: NEGATIVE
Leukocytes, UA: NEGATIVE
Nitrite: NEGATIVE
Urobilinogen, UA: 0.2 (ref 0.0–1.0)
pH: 6.5 (ref 5.0–8.0)

## 2012-05-08 NOTE — Patient Instructions (Addendum)
Stop amlodipine.  You will need to see if Dr. Diamantina Monks nurse can do a blood pressure check the beginning of next week No change in breathing medications.  Will look at your cpap pressure again after your upcoming hand surgery. Would call Dr. Venetia Maxon and see if he can delay surgery another week to allow you more time to improve after your recent hospitalization Please call if you are not improving by next week, otherwise followup with me a few weeks after your hand surgery to see how you are doing.

## 2012-05-08 NOTE — Assessment & Plan Note (Signed)
The pt has had a recent hospitalization for copd exacerbation, and does not feel she is back to baseline yet.  Her breathing and cough/congestion have improved, and she is producing very little mucus although she still has some chest congestion.  She has been treated with abx/pred course.  I would like to give her more time since she has no bronchospasm on exam and is in no distress.  I would put her upcoming carpal tunnel surgery off a week.

## 2012-05-08 NOTE — Assessment & Plan Note (Signed)
The patient's blood pressure is a little on the low side today, but there is nothing to suggest that she is too dry.  Her blood work last week shows a normal BUN and creatinine.  Her weight is down 23 pounds from April, but she has been working very hard on a diet.  I have asked her to stop her Norvasc, and to have her blood pressure rechecked next week by her primary physician.  Will continue her current diuretic regimen since she did not appear dry by her lab work last week.  If her low blood pressure persists, her diuretic dose may have to be decreased.

## 2012-05-08 NOTE — Progress Notes (Signed)
  Subjective:    [name], female    DOB: [birth year], 46 y.o.   MRN: [id number]  HPI The patient comes in today after her recent hospitalization for a COPD exacerbation.  She was treated with antibiotics as well as steroids, and discharged last week in stable condition.  The patient feels that she is not return to her usual baseline, and still has some congestion.  Her mucus has decreased in quantity, and the color is clearing.  I have noted that her weight has decreased 23 pounds since her visit in April, and the patient states she has been working on diet.  She denies increasing lower extremity edema.   Review of Systems  Constitutional: Negative for fever and unexpected weight change.  HENT: Positive for congestion and trouble swallowing. Negative for ear pain, nosebleeds, sore throat, rhinorrhea, sneezing, dental problem, postnasal drip and sinus pressure.   Eyes: Negative for redness and itching.  Respiratory: Positive for cough, shortness of breath and wheezing. Negative for chest tightness.   Cardiovascular: Negative for palpitations and leg swelling.  Gastrointestinal: Positive for nausea. Negative for vomiting.  Genitourinary: Negative for dysuria.  Musculoskeletal: Negative for joint swelling.  Skin: Positive for rash.  Neurological: Positive for headaches.  Hematological: Does not bruise/bleed easily.  Psychiatric/Behavioral: Negative for dysphoric mood. The patient is not nervous/anxious.        Objective:   Physical Exam Obese female in no acute distress Nose without purulence or discharge noted No skin breakdown or pressure necrosis from the CPAP mask Chest with faint basilar crackles, no wheezing or rhonchi, excellent air flow. Cardiac exam with regular rate and rhythm Lower extremities with no significant edema, no cyanosis Alert and oriented, moves all 4 extremities.       Assessment & Plan:

## 2012-05-09 LAB — BASIC METABOLIC PANEL
BUN: 13 mg/dL (ref 6–23)
Calcium: 9 mg/dL (ref 8.4–10.5)
GFR: 79.77 mL/min (ref >60.00)
Glucose, Bld: 74 mg/dL (ref 70–99)
Potassium: 3.6 mEq/L (ref 3.5–5.1)

## 2012-05-10 ENCOUNTER — Ambulatory Visit (INDEPENDENT_AMBULATORY_CARE_PROVIDER_SITE_OTHER): Payer: Medicare Other | Admitting: Internal Medicine

## 2012-05-10 ENCOUNTER — Encounter: Payer: Self-pay | Admitting: Internal Medicine

## 2012-05-10 ENCOUNTER — Other Ambulatory Visit (INDEPENDENT_AMBULATORY_CARE_PROVIDER_SITE_OTHER): Payer: Medicare Other

## 2012-05-10 VITALS — BP 108/78 | HR 83 | Temp 97.5°F | Ht 68.5 in | Wt 284.0 lb

## 2012-05-10 DIAGNOSIS — T148XXA Other injury of unspecified body region, initial encounter: Secondary | ICD-10-CM

## 2012-05-10 DIAGNOSIS — IMO0001 Reserved for inherently not codable concepts without codable children: Secondary | ICD-10-CM

## 2012-05-10 DIAGNOSIS — G5602 Carpal tunnel syndrome, left upper limb: Secondary | ICD-10-CM

## 2012-05-10 DIAGNOSIS — E1165 Type 2 diabetes mellitus with hyperglycemia: Secondary | ICD-10-CM

## 2012-05-10 DIAGNOSIS — IMO0002 Reserved for concepts with insufficient information to code with codable children: Secondary | ICD-10-CM

## 2012-05-10 DIAGNOSIS — G5600 Carpal tunnel syndrome, unspecified upper limb: Secondary | ICD-10-CM

## 2012-05-10 DIAGNOSIS — I5032 Chronic diastolic (congestive) heart failure: Secondary | ICD-10-CM

## 2012-05-10 MED ORDER — ESOMEPRAZOLE MAGNESIUM 40 MG PO CPDR: 40.0000 mg | DELAYED_RELEASE_CAPSULE | Freq: Two times a day (BID) | ORAL | Status: DC

## 2012-05-10 MED ORDER — MUPIROCIN 2 % EX OINT: TOPICAL_OINTMENT | CUTANEOUS | Status: DC

## 2012-05-10 MED ORDER — OXYCODONE HCL 10 MG PO TABS: 5.0000 mg | ORAL_TABLET | Freq: Three times a day (TID) | ORAL | Status: DC | PRN

## 2012-05-10 NOTE — Assessment & Plan Note (Signed)
New dx, clarified 11/2011 - exacerbated by pred use Intol of metformin due to severe diarrhea On Lantus pen qhs and Amaryl in AM - titrate as needed Continue with plans for DM education and home cbgs recheck a1c in 3 mo, sooner if problems Lab Results  Component Value Date   HGBA1C 7.4* 12/22/2011

## 2012-05-10 NOTE — Progress Notes (Signed)
Subjective:    [name], female    DOB: September 05, 1966, 46 y.o.   MRN: [id number]  HPI  Here for preop clearance -  planning L carpal tunnel surgery - local anesthesia planned Stopped amlodipine 24h ago due to low blood pressure - denies chest pain, dizziness Recent ER/hosp for cardiac eval  no change in edema, change in chronic shortness of breath or new dyspnea on exertion  no history of coronary artery disease or chronic kidney disease; controlled diabetes mellitus   pt able to walk 2 blocks with fatigue (but limited by dyspnea, especially if climbing stairs)   also reviewed chronic medical issues:  chronic hypoxic resp failure - home O2 dep since 2010 - overlap with OSA/OHS and obst dz per pulm - BiPAP qhs - reports compliance with ongoing medical treatment and home O2 and no changes in medication dose or frequency. denies adverse side effects related to current therapy. Quit smoking 08/2010!!  hypertension - reports compliance with ongoing medical treatment and no changes in medication dose or frequency. denies adverse side effects related to current therapy - prior rash from dilt resolved  dCHF, chronic - IP echo 01/2011 reviewed: normal LVEF - reports compliance with ongoing medical treatment. denies adverse side effects related to current therapy. improved edema but increased dyspnea on exertion  - continues bid diuretic - eval by cards for same 02/2011 - to limit fluids <2L/d  morbid obesity - weights reviewed - often has fluid related weight variance but remains obese  Anxiety/depression - chronic hx same with daily BZ use - changed from klonopin to xanax 07/2010 due to "too sleepy" - on sertraline - the patient reports compliance with medication(s) as prescribed. Denies adverse side effects.   dyslipidemia -started on simva spring 2011- reports intermittent compliance with ongoing medical treatment and no changes in medication dose or frequency. denies adverse side  effects related to current therapy.   fibromyalgia - chronic pain - on daily narcotics for years - pain affects chest, back, legs and "whole body" - resumed flexeril  DM2, mild - dx clarified 3/13 - check cbgs BID - AM fasting <150, none over 200 since going home - started metformin and amaryl but stopped after 3 days due to severe diarrhea - now on Lantus pen, no hypoglycemia   Past Medical History  Diagnosis Date  . Sleep apnea     noncompliant w/ CPAP  . Hypertension   . Hyperlipidemia   . Chronic headache   . Fibromyalgia     daily narcotics  . Anxiety     hx chronic BZ use, stopped 07/2010  . Anemia   . Pulmonary sarcoidosis     unimpressive CT chest 2011  . Colonic polyp   . GERD (gastroesophageal reflux disease)   . ALLERGIC RHINITIS   . Asthma   . CHF (congestive heart failure)     Diastolic with fluid overload, May, 2012, LVEF 60%  . Morbid obesity   . Depression   . Panic attacks   . Diabetes mellitus   . COPD (chronic obstructive pulmonary disease)     on home O2, moderate airflow obstruction, suspect d/t emphysema  . Obesity     Review of Systems  Constitutional: Positive for fatigue. Negative for fever, chills and unexpected weight change.  Respiratory: Positive for cough and shortness of breath.   Cardiovascular: Negative for chest pain and leg swelling.  Musculoskeletal: Positive for myalgias and back pain. Negative for joint swelling.  Objective:   Physical Exam  BP 108/78  Pulse 83  Temp 97.5 F (36.4 C) (Oral)  Ht 5' 8.5" (1.74 m)  Wt 284 lb (128.822 kg)  BMI 42.55 kg/m2  SpO2 93%  LMP 12/28/2011 Wt Readings from Last 3 Encounters:  05/10/12 284 lb (128.822 kg)  05/08/12 289 lb 9.6 oz (131.362 kg)  04/26/12 286 lb 4.8 oz (129.865 kg)   Constitutional: She appears well-developed and well-nourished. Morbidly obese.  Cardiovascular: Normal rate, regular rhythm and normal heart sounds.  BLE with trace edema Pulmonary/Chest: Breath  sounds diminished bilateral bases but no respiratory distress. She has no wheezes. Skin - abrasion L inner gluteal fold - no cellulitis or ulcer Psychiatric: She has a normal mood and affect. Her behavior is normal.   Lab Results  Component Value Date   WBC 12.2* 04/26/2012   HGB 17.0* 04/29/2012   HCT 50.0* 04/29/2012   PLT 205 04/26/2012   GLUCOSE 74 05/08/2012   CHOL 255* 02/13/2012   TRIG 133.0 02/13/2012   HDL 54.30 02/13/2012   LDLDIRECT 186.4 02/13/2012   LDLCALC  Value: 92        Total Cholesterol/HDL:CHD Risk Coronary Heart Disease Risk Table                     Men   Women  1/2 Average Risk   3.4   3.3  Average Risk       5.0   4.4  2 X Average Risk   9.6   7.1  3 X Average Risk  23.4   11.0        Use the calculated Patient Ratio above and the CHD Risk Table to determine the patient's CHD Risk.        ATP III CLASSIFICATION (LDL):  <100     mg/dL   Optimal  644-034  mg/dL   Near or Above                    Optimal  130-159  mg/dL   Borderline  742-595  mg/dL   High  >638     mg/dL   Very High 7/56/4332   ALT 48* 04/22/2012   AST 35 04/22/2012   NA 137 05/08/2012   K 3.6 05/08/2012   CL 97 05/08/2012   CREATININE 1.0 05/08/2012   BUN 13 05/08/2012   CO2 32 05/08/2012   TSH 0.701 04/22/2012   HGBA1C 7.4* 12/22/2011       Assessment & Plan:  L CTS - This patient has been evaluated and it is felt that the surgical risk is low and outweighed by the potential benefit of the surgery. Therefore, medically clear to proceed when scheduling allows.  Buttock abrasion - antibiotics ointment and wound care advised  Also see problem list. Medications and labs reviewed today.

## 2012-05-10 NOTE — Assessment & Plan Note (Signed)
Chronic oxy for control of same exac by sciatica 01/2011 and again 06/2011> MRI with free fragment and encroachment on L4 root -  status post R L4-5 microdiscectomy 07/06/2011 > symptoms improved  Now planning L CTR - continue chronic pain mgmt as ongoing - refills provided

## 2012-05-10 NOTE — Assessment & Plan Note (Signed)
Acute exac 10/2011 and 12/2011- treated with temporary increase diuretic x 4 days  Off amlodipine 05/08/12 due to low blood pressure (asymptomatic) Continue monitor sodium intake and monitor weights at home - call if increase weight >3#/day Encouragement provided on continued weight loss success

## 2012-05-10 NOTE — Patient Instructions (Addendum)
It was good to see you today. Test(s) ordered today. Your results will be called to you after review (48-72hours after test completion). If any changes need to be made, you will be notified at that time. we'll make referral to eye doctor . Our office will contact you regarding appointment(s) once made. Increase Nexium to 2x/day, use antibiotics ointment to abrasion on buttock as needed - Your prescription(s) have been submitted to your pharmacy. Please take as directed and contact our office if you believe you are having problem(s) with the medication(s). Refill on medication(s) as discussed today. You have been evaluated and it is felt that your surgical risk is low and outweighed by the potential benefit of the surgery. Therefore, medically clear to proceed when scheduling allows. Please keep scheduled followup as planned, call sooner if problems.

## 2012-05-15 ENCOUNTER — Emergency Department (HOSPITAL_COMMUNITY): Payer: Medicare Other

## 2012-05-15 ENCOUNTER — Encounter (HOSPITAL_COMMUNITY): Payer: Self-pay | Admitting: Emergency Medicine

## 2012-05-15 DIAGNOSIS — R209 Unspecified disturbances of skin sensation: Secondary | ICD-10-CM | POA: Insufficient documentation

## 2012-05-15 DIAGNOSIS — E119 Type 2 diabetes mellitus without complications: Secondary | ICD-10-CM | POA: Insufficient documentation

## 2012-05-15 DIAGNOSIS — F411 Generalized anxiety disorder: Secondary | ICD-10-CM | POA: Insufficient documentation

## 2012-05-15 DIAGNOSIS — M5126 Other intervertebral disc displacement, lumbar region: Secondary | ICD-10-CM | POA: Insufficient documentation

## 2012-05-15 DIAGNOSIS — I509 Heart failure, unspecified: Secondary | ICD-10-CM | POA: Insufficient documentation

## 2012-05-15 DIAGNOSIS — G4730 Sleep apnea, unspecified: Secondary | ICD-10-CM | POA: Insufficient documentation

## 2012-05-15 DIAGNOSIS — K219 Gastro-esophageal reflux disease without esophagitis: Secondary | ICD-10-CM | POA: Insufficient documentation

## 2012-05-15 DIAGNOSIS — J4489 Other specified chronic obstructive pulmonary disease: Secondary | ICD-10-CM | POA: Insufficient documentation

## 2012-05-15 DIAGNOSIS — I10 Essential (primary) hypertension: Secondary | ICD-10-CM | POA: Insufficient documentation

## 2012-05-15 DIAGNOSIS — E785 Hyperlipidemia, unspecified: Secondary | ICD-10-CM | POA: Insufficient documentation

## 2012-05-15 DIAGNOSIS — Z794 Long term (current) use of insulin: Secondary | ICD-10-CM | POA: Insufficient documentation

## 2012-05-15 DIAGNOSIS — Z87891 Personal history of nicotine dependence: Secondary | ICD-10-CM | POA: Insufficient documentation

## 2012-05-15 DIAGNOSIS — J449 Chronic obstructive pulmonary disease, unspecified: Secondary | ICD-10-CM | POA: Insufficient documentation

## 2012-05-15 DIAGNOSIS — IMO0001 Reserved for inherently not codable concepts without codable children: Secondary | ICD-10-CM | POA: Insufficient documentation

## 2012-05-15 DIAGNOSIS — Z79899 Other long term (current) drug therapy: Secondary | ICD-10-CM | POA: Insufficient documentation

## 2012-05-15 NOTE — ED Notes (Signed)
PT. REPORTS LOW BACK PAIN WITH RIGHT LEG NUMBNESS / RIGHT FOOT TINGLING ONSET THIS MORNING , DENIES INJURY OR FALL .

## 2012-05-16 ENCOUNTER — Emergency Department (HOSPITAL_COMMUNITY)
Admission: EM | Admit: 2012-05-16 | Discharge: 2012-05-16 | Disposition: A | Discharge status: Home / Self Care | Payer: Medicare Other | Attending: Emergency Medicine | Admitting: Emergency Medicine

## 2012-05-16 DIAGNOSIS — M545 Low back pain: Secondary | ICD-10-CM

## 2012-05-16 DIAGNOSIS — IMO0002 Reserved for concepts with insufficient information to code with codable children: Secondary | ICD-10-CM

## 2012-05-16 DIAGNOSIS — M5416 Radiculopathy, lumbar region: Secondary | ICD-10-CM

## 2012-05-16 MED ORDER — OXYCODONE-ACETAMINOPHEN 5-325 MG PO TABS: 1.0000 | ORAL_TABLET | ORAL | Status: AC | PRN

## 2012-05-16 MED ORDER — HYDROMORPHONE HCL PF 1 MG/ML IJ SOLN
1.0000 mg | INTRAMUSCULAR | Status: AC
  Administered 2012-05-16: 1 mg via INTRAVENOUS
  Filled 2012-05-16: qty 1

## 2012-05-16 MED ORDER — DIAZEPAM 5 MG PO TABS
5.0000 mg | ORAL_TABLET | Freq: Once | ORAL | Status: AC
  Administered 2012-05-16: 5 mg via ORAL
  Filled 2012-05-16: qty 1

## 2012-05-16 MED ORDER — DIAZEPAM 5 MG PO TABS: 5.0000 mg | ORAL_TABLET | Freq: Three times a day (TID) | ORAL | Status: DC | PRN

## 2012-05-16 NOTE — ED Notes (Signed)
Pt requesting pain meds.  Acknowledged pt's back and RLE pain. Informed pt that PA/MD will see pt ASAP .  RN will dispense meds once ordered

## 2012-05-16 NOTE — ED Notes (Signed)
Pt had surgery of  L4-L5 on 10.11.12   Doing "OK" since surgery but having increasing numbness in right LE  and it "feels like pins are sticking through my feet so much I can barely walk on them>"   Taking oxycodone, aleve, naproxen, biofreeze  For LBP 9/10 without effect.    Palpable pedal pulses bilaterally.

## 2012-05-16 NOTE — ED Provider Notes (Signed)
History     CSN: [id number]  Arrival date & time 05/15/12  2212   First MD Initiated Contact with Patient 05/16/12 0224      Chief Complaint  Patient presents with  . Back Pain    (Consider location/radiation/quality/duration/timing/severity/associated sxs/prior treatment) HPI Comments: Patient with hx L4-5 surgery Oct 2012 by Dr Venetia Maxon reports sudden return of pain in lower back that began yesterday morning when she got out of bed.  Associated tingling down right leg into foot.  Pain is currently 9/10.  Worse with ambulation.  Denies fevers, weakness of the leg, loss of control of bowel or bladder.  Denies abdominal pain, urinary symptoms.  Pt has been mildly constipated.  Denies any known injury - has been cleaning recently but denies heavy lifting.  Pt has been using oxycodone, naprosyn, heating pads, and biofreeze without improvement.  States she is now out of oxycodone.    Patient is a 46 y.o. female presenting with back pain. The history is provided by the patient.  Back Pain  Associated symptoms include numbness. Pertinent negatives include no fever, no abdominal pain, no dysuria and no weakness.    Past Medical History  Diagnosis Date  . Sleep apnea     noncompliant w/ CPAP  . Hypertension   . Hyperlipidemia   . Chronic headache   . Fibromyalgia     daily narcotics  . Anxiety     hx chronic BZ use, stopped 07/2010  . Anemia   . Pulmonary sarcoidosis     unimpressive CT chest 2011  . Colonic polyp   . GERD (gastroesophageal reflux disease)   . ALLERGIC RHINITIS   . Asthma   . CHF (congestive heart failure)     Diastolic with fluid overload, May, 2012, LVEF 60%  . Morbid obesity   . Depression   . Panic attacks   . Diabetes mellitus   . COPD (chronic obstructive pulmonary disease)     on home O2, moderate airflow obstruction, suspect d/t emphysema  . Obesity     Past Surgical History  Procedure Date  . Polypectomy 2011  . Lumbar microdiscectomy 07/06/2011    R L4-5, stern  . Back surgery     Family History  Problem Relation Age of Onset  . Hypertension Mother   . Emphysema Father   . Hypertension Father   . Stomach cancer Father   . Allergies Brother   . Hypertension Brother   . Stomach cancer Brother   . Heart disease Mother   . Heart disease Father   . Heart disease Brother     History  Substance Use Topics  . Smoking status: Former Smoker -- 1.0 packs/day for 29 years    Types: Cigarettes    Quit date: 09/14/2010  . Smokeless tobacco: Never Used  . Alcohol Use: Yes     occasionally    OB History    Grav Para Term Preterm Abortions TAB SAB Ect Mult Living                  Review of Systems  Constitutional: Negative for fever.  Gastrointestinal: Positive for constipation. Negative for nausea, vomiting and abdominal pain.  Genitourinary: Negative for dysuria, urgency and frequency.  Musculoskeletal: Positive for back pain.  Neurological: Positive for numbness. Negative for weakness.    Allergies  Ciprofloxacin; Latex; Sulfamethoxazole w-trimethoprim; Azithromycin; Doxycycline; and Metronidazole  Home Medications   Current Outpatient Rx  Name Route Sig Dispense Refill  . ALBUTEROL SULFATE HFA  108 (90 BASE) MCG/ACT IN AERS Inhalation Inhale 2 puffs into the lungs every 6 (six) hours as needed. For wheeze or shortness of breath    . ALBUTEROL SULFATE (2.5 MG/3ML) 0.083% IN NEBU Nebulization Take 2.5 mg by nebulization every 6 (six) hours as needed. For wheeze or shortness of breath    . ALPRAZOLAM 1 MG PO TABS Oral Take 1 mg by mouth 2 (two) times daily as needed. For anxiety    . ASPIRIN 81 MG PO TBEC Oral Take 81 mg by mouth daily.    . BUDESONIDE-FORMOTEROL FUMARATE 160-4.5 MCG/ACT IN AERO Inhalation Inhale 2 puffs into the lungs 2 (two) times daily.    Marland Kitchen CETIRIZINE HCL 10 MG PO TABS Oral Take 10 mg by mouth at bedtime.    . CYCLOBENZAPRINE HCL 10 MG PO TABS Oral Take 10 mg by mouth 3 (three) times daily as needed.  For pain    . ESOMEPRAZOLE MAGNESIUM 40 MG PO CPDR Oral Take 1 capsule (40 mg total) by mouth 2 (two) times daily. 60 capsule 3  . GLIMEPIRIDE 1 MG PO TABS Oral Take 1 tablet (1 mg total) by mouth daily before breakfast. 30 tablet 5  . GLUCOSE BLOOD VI STRP  Use as instructed 100 each 2  . INSULIN GLARGINE 100 UNIT/ML [place] SOLN Subcutaneous Inject 8-16 Units into the skin at bedtime. If blood sugar is over 100, patient uses 16 units. If under 100, patient uses 8 units. Last dose was 8 units Saturday night 04/20/12    . MOMETASONE FUROATE 50 MCG/ACT NA SUSP Nasal Place 2 sprays into the nose daily. 17 g 12  . ONETOUCH DELICA LANCETS FINE MISC  USE TO TEST DAILY BEFORE MEALS AND AT BEDTIME 100 each 2  . OXYCODONE HCL 10 MG PO TABS Oral Take 0.5-1 tablets (5-10 mg total) by mouth 3 (three) times daily as needed. For pain 60 tablet 0  . POTASSIUM CHLORIDE CRYS ER 20 MEQ PO TBCR Oral Take 20 mEq by mouth 2 (two) times daily.     . SERTRALINE HCL 25 MG PO TABS Oral Take 25 mg by mouth daily.    Marland Kitchen SPIRONOLACTONE 25 MG PO TABS  Take 1 by mouth daily    . TIOTROPIUM BROMIDE MONOHYDRATE 18 MCG IN CAPS Inhalation Place 1 capsule (18 mcg total) into inhaler and inhale daily. 30 capsule 5  . TORSEMIDE 100 MG PO TABS Oral Take 1 tablet (100 mg total) by mouth 2 (two) times daily with a meal. 60 tablet 3    BP 126/78  Pulse 89  Temp 97.8 F (36.6 C) (Oral)  Resp 21  SpO2 91%  LMP 12/28/2010  Physical Exam  Nursing note and vitals reviewed. Constitutional: She appears well-developed and well-nourished. No distress.  HENT:  Head: Normocephalic and atraumatic.  Neck: Neck supple.  Pulmonary/Chest: Effort normal.  Abdominal: Soft. She exhibits no distension. There is no tenderness. There is no rebound and no guarding.       obese  Musculoskeletal:       Cervical back: She exhibits no bony tenderness.       Thoracic back: She exhibits no bony tenderness.       Lumbar back: She exhibits bony tenderness  and pain. She exhibits no deformity.       Lumbar spine diffusely tender without crepitus or step offs.  Lower extremities:  Strength 5/5, sensation intact, distal pulses intact.     Neurological: She is alert.  Skin: She is  not diaphoretic.    ED Course  Procedures (including critical care time)  Labs Reviewed  GLUCOSE, CAPILLARY - Abnormal; Notable for the following:    Glucose-Capillary 159 (*)     All other components within normal limits   Dg Lumbar Spine Complete  05/15/2012  *RADIOLOGY REPORT*  Clinical Data: Low back pain with numbness down right leg  LUMBAR SPINE - COMPLETE 4+ VIEW  Comparison: 03/11/2012  Findings: 3 mm anterior slip L4-5 with associated disc space narrowing.  Mild disc space narrowing at L5-S1.  Negative for fracture or pars defect.  No bony lesion  IMPRESSION: Mild grade 1 slip L4-5 with mild disc space narrowing.  No acute bony abnormality.   Original Report Authenticated By: Camelia Phenes, M.D.    2:53 AM Discussed patient and reviewed xray reading with Dr Estell Harpin.  Plan is for pain control, ambulation, d/c home with follow up with Dr Venetia Maxon.  If unable to control pain here, will consider back pain protocol, MRI.   There is no weakness on exam.    3:53 AM Patient reports the pain medication is helping a lot.  One dose of dilaudid and valium given.  States she thinks she can ambulate now.  Will ambulate and reassess.  Anticipate d/c home.    4:18 AM Patient ambulated up and down hall without difficulty.  States she felt good while ambulating.    1. Slipped intervertebral disc   2. Lumbar radiculopathy   3. Low back pain       MDM  Patient with hx lumbar surgery with increased pain yesterday without known injury.  Xrays shows mild slip at L4-5 with disc space narrowing.  Pt has no weakness on exam but was in extreme pain.  Pain controlled with one dose of dilaudid and valium.  Pt ambulated comfortably and without difficulty.  Pt d/c home with neurosurgery  follow up, percocet and valium for pain.  Discussed all results with patient.  Pt given return precautions.  Pt verbalizes understanding and agrees with plan.           [name] [place], [place] 05/16/12 [phone]

## 2012-05-17 NOTE — ED Provider Notes (Signed)
Medical screening examination/treatment/procedure(s) were performed by non-physician practitioner and as supervising physician I was immediately available for consultation/collaboration.   Auria Mckinlay L Lezlee Gills, MD 05/17/12 0421 

## 2012-05-20 ENCOUNTER — Ambulatory Visit: Payer: Self-pay | Admitting: Internal Medicine

## 2012-05-21 ENCOUNTER — Encounter: Payer: Self-pay | Admitting: Internal Medicine

## 2012-05-24 ENCOUNTER — Ambulatory Visit: Payer: Medicare Other | Admitting: Pulmonary Disease

## 2012-06-10 ENCOUNTER — Other Ambulatory Visit: Payer: Self-pay | Admitting: *Deleted

## 2012-06-10 ENCOUNTER — Other Ambulatory Visit: Payer: Self-pay | Admitting: Internal Medicine

## 2012-06-10 MED ORDER — OXYCODONE HCL 10 MG PO TABS: 5.0000 mg | ORAL_TABLET | Freq: Three times a day (TID) | ORAL | Status: DC | PRN

## 2012-06-10 NOTE — Telephone Encounter (Signed)
Pt informed via VM rx ready for pickup, rx placed upfront in cabinet.

## 2012-06-10 NOTE — Telephone Encounter (Signed)
ok 

## 2012-06-10 NOTE — Telephone Encounter (Signed)
Pt called requesting refill of Oxycodone-last written 05/10/2012 #60 with 0 refills-please advise

## 2012-06-11 ENCOUNTER — Ambulatory Visit: Payer: Medicare Other | Admitting: Internal Medicine

## 2012-06-11 DIAGNOSIS — Z0289 Encounter for other administrative examinations: Secondary | ICD-10-CM

## 2012-06-12 ENCOUNTER — Emergency Department (HOSPITAL_COMMUNITY): Payer: Medicare Other

## 2012-06-12 ENCOUNTER — Encounter (HOSPITAL_COMMUNITY): Payer: Self-pay | Admitting: Emergency Medicine

## 2012-06-12 DIAGNOSIS — I509 Heart failure, unspecified: Secondary | ICD-10-CM | POA: Insufficient documentation

## 2012-06-12 DIAGNOSIS — J441 Chronic obstructive pulmonary disease with (acute) exacerbation: Secondary | ICD-10-CM | POA: Insufficient documentation

## 2012-06-12 DIAGNOSIS — IMO0001 Reserved for inherently not codable concepts without codable children: Secondary | ICD-10-CM | POA: Insufficient documentation

## 2012-06-12 DIAGNOSIS — Z79899 Other long term (current) drug therapy: Secondary | ICD-10-CM | POA: Insufficient documentation

## 2012-06-12 DIAGNOSIS — I10 Essential (primary) hypertension: Secondary | ICD-10-CM | POA: Insufficient documentation

## 2012-06-12 DIAGNOSIS — Z7982 Long term (current) use of aspirin: Secondary | ICD-10-CM | POA: Insufficient documentation

## 2012-06-12 DIAGNOSIS — E119 Type 2 diabetes mellitus without complications: Secondary | ICD-10-CM | POA: Insufficient documentation

## 2012-06-12 DIAGNOSIS — Z794 Long term (current) use of insulin: Secondary | ICD-10-CM | POA: Insufficient documentation

## 2012-06-12 LAB — BASIC METABOLIC PANEL
BUN: 14 mg/dL (ref 6–23)
CO2: 30 mEq/L (ref 19–32)
Chloride: 97 mEq/L (ref 96–112)
GFR calc non Af Amer: 51 mL/min — ABNORMAL LOW (ref >90)
Glucose, Bld: 103 mg/dL — ABNORMAL HIGH (ref 70–99)
Potassium: 4.2 mEq/L (ref 3.5–5.1)
Sodium: 137 mEq/L (ref 135–145)

## 2012-06-12 LAB — CBC
HCT: 44.7 % (ref 36.0–46.0)
Hemoglobin: 14.4 g/dL (ref 12.0–15.0)
MCH: 25.5 pg — ABNORMAL LOW (ref 26.0–34.0)
MCHC: 32.2 g/dL (ref 30.0–36.0)
RBC: 5.64 MIL/uL — ABNORMAL HIGH (ref 3.87–5.11)

## 2012-06-12 LAB — POCT I-STAT TROPONIN I: Troponin i, poc: 0 ng/mL (ref 0.00–0.08)

## 2012-06-12 MED ORDER — ALBUTEROL SULFATE (5 MG/ML) 0.5% IN NEBU
5.0000 mg | INHALATION_SOLUTION | Freq: Once | RESPIRATORY_TRACT | Status: AC
  Administered 2012-06-12: 5 mg via RESPIRATORY_TRACT
  Filled 2012-06-12: qty 1

## 2012-06-12 NOTE — ED Notes (Signed)
Reports having SOB for 3 days, hx of COPD; reports cough congestion X 1 week--with phelgm

## 2012-06-13 ENCOUNTER — Emergency Department (HOSPITAL_COMMUNITY)
Admission: EM | Admit: 2012-06-13 | Discharge: 2012-06-13 | Disposition: A | Discharge status: Home / Self Care | Payer: Medicare Other | Attending: Emergency Medicine | Admitting: Emergency Medicine

## 2012-06-13 ENCOUNTER — Other Ambulatory Visit: Payer: Self-pay | Admitting: General Practice

## 2012-06-13 DIAGNOSIS — J441 Chronic obstructive pulmonary disease with (acute) exacerbation: Secondary | ICD-10-CM

## 2012-06-13 MED ORDER — AMOXICILLIN-POT CLAVULANATE 875-125 MG PO TABS
1.0000 | ORAL_TABLET | Freq: Once | ORAL | Status: AC
  Administered 2012-06-13: 1 via ORAL
  Filled 2012-06-13: qty 1

## 2012-06-13 MED ORDER — PREDNISONE 20 MG PO TABS: 40.0000 mg | ORAL_TABLET | Freq: Every day | ORAL | Status: DC

## 2012-06-13 MED ORDER — ALBUTEROL (5 MG/ML) CONTINUOUS INHALATION SOLN
10.0000 mg/h | INHALATION_SOLUTION | RESPIRATORY_TRACT | Status: AC
  Administered 2012-06-13: 10 mg/h via RESPIRATORY_TRACT
  Filled 2012-06-13: qty 20

## 2012-06-13 MED ORDER — AMOXICILLIN-POT CLAVULANATE 875-125 MG PO TABS: 1.0000 | ORAL_TABLET | Freq: Two times a day (BID) | ORAL | Status: DC

## 2012-06-13 MED ORDER — PREDNISONE 20 MG PO TABS
40.0000 mg | ORAL_TABLET | Freq: Once | ORAL | Status: AC
Start: 2012-06-13 — End: 2012-06-13
  Administered 2012-06-13: 40 mg via ORAL
  Filled 2012-06-13: qty 2

## 2012-06-13 MED ORDER — ACETAMINOPHEN 325 MG PO TABS
650.0000 mg | ORAL_TABLET | Freq: Once | ORAL | Status: AC
  Administered 2012-06-13: 650 mg via ORAL
  Filled 2012-06-13: qty 2

## 2012-06-13 NOTE — ED Notes (Signed)
PT ambulated with baseline gait; VSS; A&Ox3; no signs of distress; respirations even and unlabored; skin warm and dry; no questions upon discharge.  

## 2012-06-13 NOTE — ED Provider Notes (Signed)
History     CSN: [id number]  Arrival date & time 06/12/12  2203   First MD Initiated Contact with Patient 06/13/12 0023      Chief Complaint  Patient presents with  . Shortness of Breath    (Consider location/radiation/quality/duration/timing/severity/associated sxs/prior treatment) HPI Comments: The patient is a 46 year old female who presents with a complaint of shortness of breath and a productive cough which has been ongoing for approximately one week. It is gradually getting worse, it is associated with wheezing and seems to be only getting mildly better with albuterol inhaler treatments at home. She has also been using a cough medicine and some leftover prednisone from prior COPD exacerbations. She denies fevers or chills nausea or vomiting. The symptoms are gradually worsening.  Patient is a 46 y.o. female presenting with shortness of breath. The history is provided by the patient and medical records.  Shortness of Breath  Associated symptoms include shortness of breath.    Past Medical History  Diagnosis Date  . Sleep apnea     noncompliant w/ CPAP  . Hypertension   . Hyperlipidemia   . Chronic headache   . Fibromyalgia     daily narcotics  . Anxiety     hx chronic BZ use, stopped 07/2010  . Anemia   . Pulmonary sarcoidosis     unimpressive CT chest 2011  . Colonic polyp   . GERD (gastroesophageal reflux disease)   . ALLERGIC RHINITIS   . Asthma   . CHF (congestive heart failure)     Diastolic with fluid overload, May, 2012, LVEF 60%  . Morbid obesity   . Depression   . Panic attacks   . Diabetes mellitus   . COPD (chronic obstructive pulmonary disease)     on home O2, moderate airflow obstruction, suspect d/t emphysema  . Obesity     Past Surgical History  Procedure Date  . Polypectomy 2011  . Lumbar microdiscectomy 07/06/2011    R L4-5, stern  . Back surgery   . Carpal tunnel release     Family History  Problem Relation Age of Onset  .  Hypertension Mother   . Emphysema Father   . Hypertension Father   . Stomach cancer Father   . Allergies Brother   . Hypertension Brother   . Stomach cancer Brother   . Heart disease Mother   . Heart disease Father   . Heart disease Brother     History  Substance Use Topics  . Smoking status: Former Smoker -- 1.0 packs/day for 29 years    Types: Cigarettes    Quit date: 09/14/2010  . Smokeless tobacco: Never Used  . Alcohol Use: Yes     occasionally    OB History    Grav Para Term Preterm Abortions TAB SAB Ect Mult Living                  Review of Systems  Respiratory: Positive for shortness of breath.   All other systems reviewed and are negative.    Allergies  Ciprofloxacin; Latex; Sulfamethoxazole w-trimethoprim; Azithromycin; Doxycycline; and Metronidazole  Home Medications   Current Outpatient Rx  Name Route Sig Dispense Refill  . ALBUTEROL SULFATE HFA 108 (90 BASE) MCG/ACT IN AERS Inhalation Inhale 2 puffs into the lungs every 6 (six) hours as needed. For wheeze or shortness of breath    . ALBUTEROL SULFATE (2.5 MG/3ML) 0.083% IN NEBU Nebulization Take 2.5 mg by nebulization every 6 (six) hours as needed. For  wheeze or shortness of breath    . ALPRAZOLAM 1 MG PO TABS Oral Take 1 mg by mouth 2 (two) times daily as needed. For anxiety    . ASPIRIN 81 MG PO TBEC Oral Take 81 mg by mouth daily.    . BUDESONIDE-FORMOTEROL FUMARATE 160-4.5 MCG/ACT IN AERO Inhalation Inhale 2 puffs into the lungs 2 (two) times daily.    Marland Kitchen CETIRIZINE HCL 10 MG PO TABS Oral Take 10 mg by mouth at bedtime.    Marland Kitchen ESOMEPRAZOLE MAGNESIUM 40 MG PO CPDR Oral Take 1 capsule (40 mg total) by mouth 2 (two) times daily. 60 capsule 3  . GLIMEPIRIDE 1 MG PO TABS Oral Take 1 tablet (1 mg total) by mouth daily before breakfast. 30 tablet 5  . MUCINEX PO Oral Take 1 tablet by mouth every 12 (twelve) hours as needed. For congestion    . INSULIN GLARGINE 100 UNIT/ML [place] SOLN Subcutaneous Inject 8-16  Units into the skin at bedtime. If blood sugar is over 100, patient uses 16 units. If under 100, patient uses 8 units. Last dose was 8 units Saturday night 04/20/12    . MOMETASONE FUROATE 50 MCG/ACT NA SUSP Nasal Place 2 sprays into the nose daily. 17 g 12  . OXYCODONE HCL 10 MG PO TABS Oral Take 0.5-1 tablets (5-10 mg total) by mouth 3 (three) times daily as needed. For pain 60 tablet 0  . POTASSIUM CHLORIDE CRYS ER 20 MEQ PO TBCR Oral Take 20 mEq by mouth 2 (two) times daily.     . SERTRALINE HCL 25 MG PO TABS Oral Take 25 mg by mouth daily.    Marland Kitchen SPIRONOLACTONE 25 MG PO TABS  Take 1 by mouth daily    . TIOTROPIUM BROMIDE MONOHYDRATE 18 MCG IN CAPS Inhalation Place 1 capsule (18 mcg total) into inhaler and inhale daily. 30 capsule 5  . TORSEMIDE 100 MG PO TABS Oral Take 1 tablet (100 mg total) by mouth 2 (two) times daily with a meal. 60 tablet 3  . AMOXICILLIN-POT CLAVULANATE 875-125 MG PO TABS Oral Take 1 tablet by mouth every 12 (twelve) hours. 14 tablet 0  . GLUCOSE BLOOD VI STRP  Use as instructed 100 each 2  . ONETOUCH DELICA LANCETS FINE MISC  USE TO TEST DAILY BEFORE MEALS AND AT BEDTIME 100 each 2  . PREDNISONE 20 MG PO TABS Oral Take 2 tablets (40 mg total) by mouth daily. 10 tablet 0    BP 110/55  Pulse 80  Temp 97.8 F (36.6 C) (Oral)  Resp 12  SpO2 93%  LMP 12/28/2010  Physical Exam  Nursing note and vitals reviewed. Constitutional: She appears well-developed and well-nourished. No distress.  HENT:  Head: Normocephalic and atraumatic.  Mouth/Throat: Oropharynx is clear and moist. No oropharyngeal exudate.  Eyes: Conjunctivae normal and EOM are normal. Pupils are equal, round, and reactive to light. Right eye exhibits no discharge. Left eye exhibits no discharge. No scleral icterus.  Neck: Normal range of motion. Neck supple. No JVD present. No thyromegaly present.  Cardiovascular: Normal rate, regular rhythm, normal heart sounds and intact distal pulses.  Exam reveals no  gallop and no friction rub.   No murmur heard. Pulmonary/Chest: Effort normal. No respiratory distress. She has wheezes. She has no rales.       Very slight increased work of breathing at 20 respirations per minute, and expiratory wheezing, no rales, speaks in full sentences  Abdominal: Soft. Bowel sounds are normal. She exhibits no  distension and no mass. There is no tenderness.  Musculoskeletal: Normal range of motion. She exhibits no edema and no tenderness.  Lymphadenopathy:    She has no cervical adenopathy.  Neurological: She is alert. Coordination normal.  Skin: Skin is warm and dry. No rash noted. No erythema.  Psychiatric: She has a normal mood and affect. Her behavior is normal.    ED Course  Procedures (including critical care time)  Labs Reviewed  BASIC METABOLIC PANEL - Abnormal; Notable for the following:    Glucose, Bld 103 (*)     Creatinine, Ser 1.25 (*)     GFR calc non Af Amer 51 (*)     GFR calc Af Amer 59 (*)     All other components within normal limits  CBC - Abnormal; Notable for the following:    RBC 5.64 (*)     MCH 25.5 (*)     RDW 17.8 (*)     All other components within normal limits  POCT I-STAT TROPONIN I   Dg Chest 2 View (if Patient Has Fever And/or Copd)  06/12/2012  *RADIOLOGY REPORT*  Clinical Data: Shortness of breath and wheezing.  COPD.  CHEST - 2 VIEW  Comparison: CT chest 11/18/2010 and plain films of the chest 04/21/2012.  Findings: The lungs are emphysematous with basilar scarring.  No consolidative process, pneumothorax or effusion.  Heart size upper normal.  IMPRESSION: COPD without acute disease.   Original Report Authenticated By: Bernadene Bell. D'ALESSIO, M.D.      1. COPD exacerbation       MDM  Has signs and symptoms consistent with reactive airway disease and COPD. Despite her negative chest x-ray with COPD had increased productive cough we'll treat with antibiotics, prednisone, albuterol and reevaluate.  The patient has  improved significantly her 1 hour long albuterol nebulizer, prednisone and Augmentin. Her vital signs remained stable with oxygen of 94% on room air and on repeat exam has minimal wheezing bilaterally and no increased work of breathing. Of note the patient wears 2 L of nasal cannula oxygen at home at all times. I have offered her observational admission on protocol for COPD but she has declined and would rather go home. Medications first for home including prednisone, Augmentin.      Vida Roller, MD 06/13/12 [phone]

## 2012-06-13 NOTE — ED Notes (Signed)
Breathing tx complete and pt reports feeling better. PT is "jittery"; RN explained that was normal after tx.

## 2012-06-13 NOTE — Telephone Encounter (Signed)
Received a fax from [facility] requesting refill of Alprazolam 1mg . This is a historical med, pt last seen on 05/20/12. Please advise. Thanks.

## 2012-06-13 NOTE — ED Notes (Signed)
RT called for hour neb.

## 2012-06-13 NOTE — ED Notes (Signed)
Pt c/o headache. Tylenol given per protocol.

## 2012-06-15 ENCOUNTER — Encounter: Payer: Self-pay | Admitting: Pulmonary Disease

## 2012-06-15 ENCOUNTER — Encounter: Payer: Self-pay | Admitting: Internal Medicine

## 2012-06-17 MED ORDER — ALPRAZOLAM 1 MG PO TABS: 1.0000 mg | ORAL_TABLET | Freq: Two times a day (BID) | ORAL | Status: DC | PRN

## 2012-06-17 NOTE — Telephone Encounter (Signed)
Called to schd apt with [facility]. She needs to be seen for "bronchitis".

## 2012-06-20 ENCOUNTER — Encounter: Payer: Self-pay | Admitting: Endocrinology

## 2012-06-20 ENCOUNTER — Ambulatory Visit (INDEPENDENT_AMBULATORY_CARE_PROVIDER_SITE_OTHER): Payer: Medicare Other | Admitting: Endocrinology

## 2012-06-20 VITALS — BP 136/74 | HR 90 | Temp 97.6°F | Wt 292.0 lb

## 2012-06-20 DIAGNOSIS — M545 Low back pain: Secondary | ICD-10-CM

## 2012-06-20 NOTE — Patient Instructions (Addendum)
blood tests are being requested for you today.  You will be contacted with results. Here are some samples of "flector" patches.  Take 1 patch, twice a day. Call if this helps, and i'll send a prescription to your pharmacy.

## 2012-06-20 NOTE — Progress Notes (Signed)
Subjective:    [name], female    DOB: 1966/02/03, 46 y.o.   MRN: [id number]  HPI Pt states 1 day of moderate exac of chronic pain at the lower back, but no assoc numbness. Past Medical History  Diagnosis Date  . Sleep apnea     noncompliant w/ CPAP  . Hypertension   . Hyperlipidemia   . Chronic headache   . Fibromyalgia     daily narcotics  . Anxiety     hx chronic BZ use, stopped 07/2010  . Anemia   . Pulmonary sarcoidosis     unimpressive CT chest 2011  . Colonic polyp   . GERD (gastroesophageal reflux disease)   . ALLERGIC RHINITIS   . Asthma   . CHF (congestive heart failure)     Diastolic with fluid overload, May, 2012, LVEF 60%  . Morbid obesity   . Depression   . Panic attacks   . Diabetes mellitus   . COPD (chronic obstructive pulmonary disease)     on home O2, moderate airflow obstruction, suspect d/t emphysema  . Obesity     Past Surgical History  Procedure Date  . Polypectomy 2011  . Lumbar microdiscectomy 07/06/2011    R L4-5, stern  . Back surgery   . Carpal tunnel release     History   Social History  . Marital Status: Single    Spouse Name: N/A    Number of Children: N/A  . Years of Education: N/A   Occupational History  . Not on file.   Social History Main Topics  . Smoking status: Former Smoker -- 1.0 packs/day for 29 years    Types: Cigarettes    Quit date: 09/14/2010  . Smokeless tobacco: Never Used  . Alcohol Use: Yes     occasionally  . Drug Use: No  . Sexually Active: Not on file   Other Topics Concern  . Not on file   Social History Narrative   Pt is single. No children, and disaability    Current Outpatient Prescriptions on File Prior to Visit  Medication Sig Dispense Refill  . albuterol (PROVENTIL HFA;VENTOLIN HFA) 108 (90 BASE) MCG/ACT inhaler Inhale 2 puffs into the lungs every 6 (six) hours as needed. For wheeze or shortness of breath      . albuterol (PROVENTIL) (2.5 MG/3ML) 0.083% nebulizer  solution Take 2.5 mg by nebulization every 6 (six) hours as needed. For wheeze or shortness of breath      . ALPRAZolam (XANAX) 1 MG tablet Take 1 tablet (1 mg total) by mouth 2 (two) times daily as needed for anxiety. For anxiety  60 tablet  1  . aspirin 81 MG EC tablet Take 81 mg by mouth daily.      . budesonide-formoterol (SYMBICORT) 160-4.5 MCG/ACT inhaler Inhale 2 puffs into the lungs 2 (two) times daily.      . cetirizine (ZYRTEC) 10 MG tablet Take 10 mg by mouth at bedtime.      Marland Kitchen esomeprazole (NEXIUM) 40 MG capsule Take 1 capsule (40 mg total) by mouth 2 (two) times daily.  60 capsule  3  . glimepiride (AMARYL) 1 MG tablet Take 1 tablet (1 mg total) by mouth daily before breakfast.  30 tablet  5  . glucose blood (ONE TOUCH ULTRA TEST) test strip Use as instructed  100 each  2  . insulin glargine (LANTUS) 100 UNIT/ML injection Inject 8-16 Units into the skin at bedtime. If blood sugar is over 100,  patient uses 16 units. If under 100, patient uses 8 units. Last dose was 8 units Saturday night 04/20/12      . mometasone (NASONEX) 50 MCG/ACT nasal spray Place 2 sprays into the nose daily.  17 g  12  . ONETOUCH DELICA LANCETS FINE MISC USE TO TEST DAILY BEFORE MEALS AND AT BEDTIME  100 each  2  . Oxycodone HCl 10 MG TABS Take 0.5-1 tablets (5-10 mg total) by mouth 3 (three) times daily as needed. For pain  60 tablet  0  . potassium chloride SA (K-DUR,KLOR-CON) 20 MEQ tablet Take 20 mEq by mouth 2 (two) times daily.       . sertraline (ZOLOFT) 25 MG tablet Take 25 mg by mouth daily.      Marland Kitchen spironolactone (ALDACTONE) 25 MG tablet Take 1 by mouth daily      . tiotropium (SPIRIVA HANDIHALER) 18 MCG inhalation capsule Place 1 capsule (18 mcg total) into inhaler and inhale daily.  30 capsule  5  . torsemide (DEMADEX) 100 MG tablet Take 1 tablet (100 mg total) by mouth 2 (two) times daily with a meal.  60 tablet  3  . amoxicillin-clavulanate (AUGMENTIN) 875-125 MG per tablet Take 1 tablet by mouth every  12 (twelve) hours.  14 tablet  0  . gabapentin (NEURONTIN) 300 MG capsule Take 2 capsules (600 mg total) by mouth daily.  30 capsule  0  . GuaiFENesin (MUCINEX PO) Take 1 tablet by mouth every 12 (twelve) hours as needed. For congestion      . predniSONE (DELTASONE) 20 MG tablet Take 2 tablets (40 mg total) by mouth daily.  10 tablet  0    Allergies  Allergen Reactions  . Ciprofloxacin     REACTION: nausea  . Latex     REACTION: rash, burning  . Sulfamethoxazole W-Trimethoprim     Bactrim REACTION: Projectile Vomiting  . Azithromycin     REACTION: resistant  . Doxycycline     REACTION: restistant  . Metronidazole Nausea And Vomiting    Family History  Problem Relation Age of Onset  . Hypertension Mother   . Emphysema Father   . Hypertension Father   . Stomach cancer Father   . Allergies Brother   . Hypertension Brother   . Stomach cancer Brother   . Heart disease Mother   . Heart disease Father   . Heart disease Brother     BP 136/74  Pulse 90  Temp 97.6 F (36.4 C) (Oral)  Wt 292 lb (132.45 kg)  LMP 12/28/2010   Review of Systems Denies bowel or bladder retention.      Objective:   Physical Exam VITAL SIGNS:  See vs page GENERAL: no distress Spine: nontender Neuro: sensation is intact to touch on the LE's Gait: normal     Assessment & Plan:  Chronic low-back pain.  It is too soon for percocet refill

## 2012-06-21 ENCOUNTER — Other Ambulatory Visit: Payer: Self-pay | Admitting: Neurosurgery

## 2012-06-21 ENCOUNTER — Emergency Department (HOSPITAL_COMMUNITY)
Admission: EM | Admit: 2012-06-21 | Discharge: 2012-06-21 | Disposition: A | Discharge status: Home / Self Care | Payer: Medicare Other | Attending: Emergency Medicine | Admitting: Emergency Medicine

## 2012-06-21 ENCOUNTER — Encounter (HOSPITAL_COMMUNITY): Payer: Self-pay | Admitting: *Deleted

## 2012-06-21 ENCOUNTER — Ambulatory Visit: Payer: Self-pay | Admitting: Adult Health

## 2012-06-21 DIAGNOSIS — F3289 Other specified depressive episodes: Secondary | ICD-10-CM | POA: Insufficient documentation

## 2012-06-21 DIAGNOSIS — F329 Major depressive disorder, single episode, unspecified: Secondary | ICD-10-CM | POA: Insufficient documentation

## 2012-06-21 DIAGNOSIS — E785 Hyperlipidemia, unspecified: Secondary | ICD-10-CM | POA: Insufficient documentation

## 2012-06-21 DIAGNOSIS — D869 Sarcoidosis, unspecified: Secondary | ICD-10-CM | POA: Insufficient documentation

## 2012-06-21 DIAGNOSIS — M545 Low back pain, unspecified: Secondary | ICD-10-CM | POA: Insufficient documentation

## 2012-06-21 DIAGNOSIS — K219 Gastro-esophageal reflux disease without esophagitis: Secondary | ICD-10-CM | POA: Insufficient documentation

## 2012-06-21 DIAGNOSIS — I509 Heart failure, unspecified: Secondary | ICD-10-CM | POA: Insufficient documentation

## 2012-06-21 DIAGNOSIS — J99 Respiratory disorders in diseases classified elsewhere: Secondary | ICD-10-CM | POA: Insufficient documentation

## 2012-06-21 DIAGNOSIS — G4730 Sleep apnea, unspecified: Secondary | ICD-10-CM | POA: Insufficient documentation

## 2012-06-21 DIAGNOSIS — I10 Essential (primary) hypertension: Secondary | ICD-10-CM | POA: Insufficient documentation

## 2012-06-21 DIAGNOSIS — J4489 Other specified chronic obstructive pulmonary disease: Secondary | ICD-10-CM | POA: Insufficient documentation

## 2012-06-21 DIAGNOSIS — F411 Generalized anxiety disorder: Secondary | ICD-10-CM | POA: Insufficient documentation

## 2012-06-21 DIAGNOSIS — Z87891 Personal history of nicotine dependence: Secondary | ICD-10-CM | POA: Insufficient documentation

## 2012-06-21 DIAGNOSIS — J449 Chronic obstructive pulmonary disease, unspecified: Secondary | ICD-10-CM | POA: Insufficient documentation

## 2012-06-21 MED ORDER — HYDROMORPHONE HCL PF 2 MG/ML IJ SOLN
2.0000 mg | Freq: Once | INTRAMUSCULAR | Status: AC
  Administered 2012-06-21: 2 mg via INTRAMUSCULAR
  Filled 2012-06-21: qty 1

## 2012-06-21 MED ORDER — GABAPENTIN 300 MG PO CAPS: 600.0000 mg | ORAL_CAPSULE | Freq: Every day | ORAL | Status: DC

## 2012-06-21 MED ORDER — METHOCARBAMOL 500 MG PO TABS
1000.0000 mg | ORAL_TABLET | Freq: Once | ORAL | Status: AC
  Administered 2012-06-21: 1000 mg via ORAL
  Filled 2012-06-21: qty 2

## 2012-06-21 MED ORDER — GABAPENTIN 300 MG PO CAPS
600.0000 mg | ORAL_CAPSULE | Freq: Once | ORAL | Status: AC
  Administered 2012-06-21: 600 mg via ORAL
  Filled 2012-06-21: qty 2

## 2012-06-21 MED ORDER — METHOCARBAMOL 500 MG PO TABS: 1000.0000 mg | ORAL_TABLET | Freq: Four times a day (QID) | ORAL | Status: DC

## 2012-06-21 NOTE — ED Notes (Signed)
Pt reports (R) lower back pain radiating down into (R) leg x2 days, pt seen her pcp today was given a patch to apply to area. Pt denies any injury but reports she was pushing a car a month ago and hurt her back was seen here and dx w/a "slipped disk."

## 2012-06-21 NOTE — ED Notes (Signed)
Pt with hx of L4-L5 surgery (not sure what kind) about 10 months ago.  About 1 month ago pt pushed car and pain began again. Wed pain increased.  Pt went to MD today and was given pain patches with no relief.  Denies loss of bowel or bladder habbits.  Pain radiates down R leg.

## 2012-06-21 NOTE — ED Provider Notes (Signed)
History     CSN: [id number]  Arrival date & time 06/21/12  0110   First MD Initiated Contact with Patient 06/21/12 0141      Chief Complaint  Patient presents with  . Back Pain    chronic    (Consider location/radiation/quality/duration/timing/severity/associated sxs/prior treatment) HPI 46 year old female says to emergency department complaining of right lower back pain radiating down her buttock and into the upper posterior portal of her thighs. She reports onset of pain about a month ago while pushing a car. Pain worsened this past Wednesday. Patient was seen yesterday in primary care office, put on pain patches which have not helped symptoms. She denies any bowel or bladder incontinence. She reports the pain is a sharp burning stinging pain. Patient has past history of lumbar surgery, has seen her neurosurgeon who is planning for MRI. Patient is taking oxycodone without improvement in symptoms.  Past Medical History  Diagnosis Date  . Sleep apnea     noncompliant w/ CPAP  . Hypertension   . Hyperlipidemia   . Chronic headache   . Fibromyalgia     daily narcotics  . Anxiety     hx chronic BZ use, stopped 07/2010  . Anemia   . Pulmonary sarcoidosis     unimpressive CT chest 2011  . Colonic polyp   . GERD (gastroesophageal reflux disease)   . ALLERGIC RHINITIS   . Asthma   . CHF (congestive heart failure)     Diastolic with fluid overload, May, 2012, LVEF 60%  . Morbid obesity   . Depression   . Panic attacks   . Diabetes mellitus   . COPD (chronic obstructive pulmonary disease)     on home O2, moderate airflow obstruction, suspect d/t emphysema  . Obesity     Past Surgical History  Procedure Date  . Polypectomy 2011  . Lumbar microdiscectomy 07/06/2011    R L4-5, stern  . Back surgery   . Carpal tunnel release     Family History  Problem Relation Age of Onset  . Hypertension Mother   . Emphysema Father   . Hypertension Father   . Stomach cancer Father     . Allergies Brother   . Hypertension Brother   . Stomach cancer Brother   . Heart disease Mother   . Heart disease Father   . Heart disease Brother     History  Substance Use Topics  . Smoking status: Former Smoker -- 1.0 packs/day for 29 years    Types: Cigarettes    Quit date: 09/14/2010  . Smokeless tobacco: Never Used  . Alcohol Use: Yes     occasionally    OB History    Grav Para Term Preterm Abortions TAB SAB Ect Mult Living                  Review of Systems  All other systems reviewed and are negative.    Allergies  Ciprofloxacin; Latex; Sulfamethoxazole w-trimethoprim; Azithromycin; Doxycycline; and Metronidazole  Home Medications   Current Outpatient Rx  Name Route Sig Dispense Refill  . ALBUTEROL SULFATE HFA 108 (90 BASE) MCG/ACT IN AERS Inhalation Inhale 2 puffs into the lungs every 6 (six) hours as needed. For wheeze or shortness of breath    . ALBUTEROL SULFATE (2.5 MG/3ML) 0.083% IN NEBU Nebulization Take 2.5 mg by nebulization every 6 (six) hours as needed. For wheeze or shortness of breath    . ALPRAZOLAM 1 MG PO TABS Oral Take 1 tablet (1  mg total) by mouth 2 (two) times daily as needed for anxiety. For anxiety 60 tablet 1  . AMOXICILLIN-POT CLAVULANATE 875-125 MG PO TABS Oral Take 1 tablet by mouth every 12 (twelve) hours. 14 tablet 0  . ASPIRIN 81 MG PO TBEC Oral Take 81 mg by mouth daily.    . BUDESONIDE-FORMOTEROL FUMARATE 160-4.5 MCG/ACT IN AERO Inhalation Inhale 2 puffs into the lungs 2 (two) times daily.    Marland Kitchen CETIRIZINE HCL 10 MG PO TABS Oral Take 10 mg by mouth at bedtime.    Marland Kitchen DICLOFENAC EPOLAMINE 1.3 % TD PTCH Transdermal Place 1 patch onto the skin 2 (two) times daily.    Marland Kitchen ESOMEPRAZOLE MAGNESIUM 40 MG PO CPDR Oral Take 1 capsule (40 mg total) by mouth 2 (two) times daily. 60 capsule 3  . GLIMEPIRIDE 1 MG PO TABS Oral Take 1 tablet (1 mg total) by mouth daily before breakfast. 30 tablet 5  . MUCINEX PO Oral Take 1 tablet by mouth every 12  (twelve) hours as needed. For congestion    . INSULIN GLARGINE 100 UNIT/ML [place] SOLN Subcutaneous Inject 8-16 Units into the skin at bedtime. If blood sugar is over 100, patient uses 16 units. If under 100, patient uses 8 units. Last dose was 8 units Saturday night 04/20/12    . MOMETASONE FUROATE 50 MCG/ACT NA SUSP Nasal Place 2 sprays into the nose daily. 17 g 12  . OXYCODONE HCL 10 MG PO TABS Oral Take 0.5-1 tablets (5-10 mg total) by mouth 3 (three) times daily as needed. For pain 60 tablet 0  . POTASSIUM CHLORIDE CRYS ER 20 MEQ PO TBCR Oral Take 20 mEq by mouth 2 (two) times daily.     Marland Kitchen PREDNISONE 20 MG PO TABS Oral Take 2 tablets (40 mg total) by mouth daily. 10 tablet 0  . SERTRALINE HCL 25 MG PO TABS Oral Take 25 mg by mouth daily.    Marland Kitchen SPIRONOLACTONE 25 MG PO TABS  Take 1 by mouth daily    . TIOTROPIUM BROMIDE MONOHYDRATE 18 MCG IN CAPS Inhalation Place 1 capsule (18 mcg total) into inhaler and inhale daily. 30 capsule 5  . TORSEMIDE 100 MG PO TABS Oral Take 1 tablet (100 mg total) by mouth 2 (two) times daily with a meal. 60 tablet 3  . GABAPENTIN 300 MG PO CAPS Oral Take 2 capsules (600 mg total) by mouth daily. 30 capsule 0  . GLUCOSE BLOOD VI STRP  Use as instructed 100 each 2  . METHOCARBAMOL 500 MG PO TABS Oral Take 2 tablets (1,000 mg total) by mouth 4 (four) times daily. 20 tablet 0  . ONETOUCH DELICA LANCETS FINE MISC  USE TO TEST DAILY BEFORE MEALS AND AT BEDTIME 100 each 2    BP 125/67  Pulse 78  Temp 98.1 F (36.7 C) (Oral)  Resp 16  SpO2 100%  LMP 12/28/2010  Physical Exam  Nursing note and vitals reviewed. Constitutional: She is oriented to person, place, and time. She appears well-developed and well-nourished.       Obese, uncomfortable appearing  HENT:  Head: Normocephalic and atraumatic.  Neck: Normal range of motion. Neck supple. No JVD present. No tracheal deviation present. No thyromegaly present.  Cardiovascular: Normal rate, regular rhythm, normal heart  sounds and intact distal pulses.  Exam reveals no gallop and no friction rub.   No murmur heard. Pulmonary/Chest: Effort normal and breath sounds normal. No stridor. No respiratory distress. She has no wheezes. She has  no rales. She exhibits no tenderness.  Abdominal: Soft. Bowel sounds are normal. She exhibits no distension and no mass. There is no tenderness. There is no rebound and no guarding.  Musculoskeletal: She exhibits tenderness.       Tenderness to palpation down the midline spine without step-off or crepitus, pain increases over the paraspinal muscles to the right, and right buttock. She has straight leg raise positive on the right  Lymphadenopathy:    She has no cervical adenopathy.  Neurological: She is alert and oriented to person, place, and time. She has normal reflexes. She displays normal reflexes. She exhibits normal muscle tone. Coordination normal.  Skin: Skin is warm and dry. No rash noted. No erythema. No pallor.    ED Course  Procedures (including critical care time)  Labs Reviewed - No data to display No results found.   1. Low back pain       MDM  46 year old female with right lower back pain. No red flags on exam or history. We'll try to control pain and refer to her neurosurgeon/primary care Dr.        Olivia Mackie, MD 06/21/12 [phone]

## 2012-06-23 ENCOUNTER — Encounter (HOSPITAL_COMMUNITY): Payer: Self-pay | Admitting: Emergency Medicine

## 2012-06-23 ENCOUNTER — Emergency Department (INDEPENDENT_AMBULATORY_CARE_PROVIDER_SITE_OTHER)
Admission: EM | Admit: 2012-06-23 | Discharge: 2012-06-23 | Disposition: A | Discharge status: Home / Self Care | Payer: Medicare Other | Source: Home / Self Care | Attending: Family Medicine | Admitting: Family Medicine

## 2012-06-23 ENCOUNTER — Emergency Department (INDEPENDENT_AMBULATORY_CARE_PROVIDER_SITE_OTHER): Payer: Medicare Other

## 2012-06-23 DIAGNOSIS — IMO0002 Reserved for concepts with insufficient information to code with codable children: Secondary | ICD-10-CM

## 2012-06-23 DIAGNOSIS — S8390XA Sprain of unspecified site of unspecified knee, initial encounter: Secondary | ICD-10-CM

## 2012-06-23 NOTE — ED Notes (Addendum)
Pt c/o left knee pain. Pt was walking down hill and twisted her knee and heard a pop. Pt states that she felt pain then but on Saturday her knee popped again and pain is worse most pain is with weight bearing. She has tried pain meds and pain patch with no relief.

## 2012-06-23 NOTE — ED Provider Notes (Signed)
History     CSN: [id number]  Arrival date & time 06/23/12  1702   First MD Initiated Contact with Patient 06/23/12 1826      Chief Complaint  Patient presents with  . Knee Pain    (Consider location/radiation/quality/duration/timing/severity/associated sxs/prior treatment) HPI Comments: Pt reports L knee "popped" on 9/27. Hurt a little but pt was still able to walk, get around ok.  Yesterday her L knee "popped" again.  Pain all the time, worst with standing/walking, but is able to bear weight. DEnies falling or any other injury.    Patient is a 46 y.o. female presenting with knee pain. The history is provided by the patient.  Knee Pain This is a new problem. The current episode started 2 days ago. The problem occurs constantly. The problem has not changed since onset.The symptoms are aggravated by walking (ROM, bearing weight). Nothing relieves the symptoms. She has tried nothing for the symptoms.    Past Medical History  Diagnosis Date  . Sleep apnea     noncompliant w/ CPAP  . Hypertension   . Hyperlipidemia   . Chronic headache   . Fibromyalgia     daily narcotics  . Anxiety     hx chronic BZ use, stopped 07/2010  . Anemia   . Pulmonary sarcoidosis     unimpressive CT chest 2011  . Colonic polyp   . GERD (gastroesophageal reflux disease)   . ALLERGIC RHINITIS   . Asthma   . CHF (congestive heart failure)     Diastolic with fluid overload, May, 2012, LVEF 60%  . Morbid obesity   . Depression   . Panic attacks   . Diabetes mellitus   . COPD (chronic obstructive pulmonary disease)     on home O2, moderate airflow obstruction, suspect d/t emphysema  . Obesity     Past Surgical History  Procedure Date  . Polypectomy 2011  . Lumbar microdiscectomy 07/06/2011    R L4-5, stern  . Back surgery   . Carpal tunnel release     Family History  Problem Relation Age of Onset  . Hypertension Mother   . Emphysema Father   . Hypertension Father   . Stomach cancer  Father   . Allergies Brother   . Hypertension Brother   . Stomach cancer Brother   . Heart disease Mother   . Heart disease Father   . Heart disease Brother     History  Substance Use Topics  . Smoking status: Former Smoker -- 1.0 packs/day for 29 years    Types: Cigarettes    Quit date: 09/14/2010  . Smokeless tobacco: Never Used  . Alcohol Use: Yes     occasionally    OB History    Grav Para Term Preterm Abortions TAB SAB Ect Mult Living                  Review of Systems  Musculoskeletal: Positive for joint swelling.       Knee pain  Skin: Negative for color change and wound.  Neurological: Negative for weakness and numbness.       No numbness or weakness feelings in L knee or lower left leg.     Allergies  Ciprofloxacin; Latex; Sulfamethoxazole w-trimethoprim; Azithromycin; Doxycycline; and Metronidazole  Home Medications   Current Outpatient Rx  Name Route Sig Dispense Refill  . ALBUTEROL SULFATE HFA 108 (90 BASE) MCG/ACT IN AERS Inhalation Inhale 2 puffs into the lungs every 6 (six) hours as needed.  For wheeze or shortness of breath    . ALBUTEROL SULFATE (2.5 MG/3ML) 0.083% IN NEBU Nebulization Take 2.5 mg by nebulization every 6 (six) hours as needed. For wheeze or shortness of breath    . ALPRAZOLAM 1 MG PO TABS Oral Take 1 tablet (1 mg total) by mouth 2 (two) times daily as needed for anxiety. For anxiety 60 tablet 1  . AMOXICILLIN-POT CLAVULANATE 875-125 MG PO TABS Oral Take 1 tablet by mouth every 12 (twelve) hours. 14 tablet 0  . ASPIRIN 81 MG PO TBEC Oral Take 81 mg by mouth daily.    . BUDESONIDE-FORMOTEROL FUMARATE 160-4.5 MCG/ACT IN AERO Inhalation Inhale 2 puffs into the lungs 2 (two) times daily.    Marland Kitchen CETIRIZINE HCL 10 MG PO TABS Oral Take 10 mg by mouth at bedtime.    Marland Kitchen DICLOFENAC EPOLAMINE 1.3 % TD PTCH Transdermal Place 1 patch onto the skin 2 (two) times daily.    Marland Kitchen ESOMEPRAZOLE MAGNESIUM 40 MG PO CPDR Oral Take 1 capsule (40 mg total) by mouth 2  (two) times daily. 60 capsule 3  . GABAPENTIN 300 MG PO CAPS Oral Take 2 capsules (600 mg total) by mouth daily. 30 capsule 0  . GLIMEPIRIDE 1 MG PO TABS Oral Take 1 tablet (1 mg total) by mouth daily before breakfast. 30 tablet 5  . GLUCOSE BLOOD VI STRP  Use as instructed 100 each 2  . MUCINEX PO Oral Take 1 tablet by mouth every 12 (twelve) hours as needed. For congestion    . INSULIN GLARGINE 100 UNIT/ML [place] SOLN Subcutaneous Inject 8-16 Units into the skin at bedtime. If blood sugar is over 100, patient uses 16 units. If under 100, patient uses 8 units. Last dose was 8 units Saturday night 04/20/12    . METHOCARBAMOL 500 MG PO TABS Oral Take 2 tablets (1,000 mg total) by mouth 4 (four) times daily. 20 tablet 0  . MOMETASONE FUROATE 50 MCG/ACT NA SUSP Nasal Place 2 sprays into the nose daily. 17 g 12  . ONETOUCH DELICA LANCETS FINE MISC  USE TO TEST DAILY BEFORE MEALS AND AT BEDTIME 100 each 2  . OXYCODONE HCL 10 MG PO TABS Oral Take 0.5-1 tablets (5-10 mg total) by mouth 3 (three) times daily as needed. For pain 60 tablet 0  . POTASSIUM CHLORIDE CRYS ER 20 MEQ PO TBCR Oral Take 20 mEq by mouth 2 (two) times daily.     Marland Kitchen PREDNISONE 20 MG PO TABS Oral Take 2 tablets (40 mg total) by mouth daily. 10 tablet 0  . SERTRALINE HCL 25 MG PO TABS Oral Take 25 mg by mouth daily.    Marland Kitchen SPIRONOLACTONE 25 MG PO TABS  Take 1 by mouth daily    . TIOTROPIUM BROMIDE MONOHYDRATE 18 MCG IN CAPS Inhalation Place 1 capsule (18 mcg total) into inhaler and inhale daily. 30 capsule 5  . TORSEMIDE 100 MG PO TABS Oral Take 1 tablet (100 mg total) by mouth 2 (two) times daily with a meal. 60 tablet 3    BP 109/58  Pulse 91  Temp 98.1 F (36.7 C) (Oral)  Resp 22  SpO2 94%  LMP 12/28/2010  Physical Exam  Constitutional: She is oriented to person, place, and time. She appears well-developed and well-nourished. No distress.       Morbidly obese  Pulmonary/Chest: Effort normal.  Musculoskeletal:       Left knee: She  exhibits swelling and effusion. She exhibits normal range of motion,  no erythema, no bony tenderness and no MCL laxity. tenderness found. Medial joint line tenderness noted.       L knee warm to touch  Neurological: She is alert and oriented to person, place, and time.       Can bear weight and walk but limps with LLE and favors L side.   Skin: Skin is warm and dry. No abrasion, no bruising and no laceration noted. No erythema.       No wound to L knee, warmth but no erythema, swelling appears c/w inflammation, not infection.     ED Course  Procedures (including critical care time)  Labs Reviewed - No data to display Dg Knee Complete 4 Views Left  06/23/2012  *RADIOLOGY REPORT*  Clinical Data: Twisting injury 06/21/2012.  LEFT KNEE - COMPLETE 4+ VIEW  Comparison: Plain films 05/16/2010.  Findings: There is no fracture or dislocation.  Small osteophytes are present about the knee, unchanged.  A new soft tissue calcification at the level of the medial metaphysis of the distal femur may be due to old trauma. Small joint effusion is noted.  IMPRESSION:  1.  Negative for fracture. 2.  Mild appearing degenerative change. 3.  Small joint effusion.   Original Report Authenticated By: Bernadene Bell. D'ALESSIO, M.D.      1. Knee sprain       MDM  Pt given rx for walker. According to chart, pt received 60 10mg  oxycodone on 9/16 from pcp; this should be sufficient for pt's pain.  Suggested she f/u with pcp if it is not. F/u with ortho if sx do not improve with rest/conservative tx.         Cathlyn Parsons, NP 06/23/12 [phone]

## 2012-06-24 NOTE — ED Provider Notes (Signed)
Medical screening examination/treatment/procedure(s) were performed by resident physician or non-physician practitioner and as supervising physician I was immediately available for consultation/collaboration.   Zariyah Stephens DOUGLAS MD.    Arul Farabee D Mccayla Shimada, MD 06/24/12 1326 

## 2012-06-25 ENCOUNTER — Telehealth: Payer: Self-pay | Admitting: *Deleted

## 2012-06-25 ENCOUNTER — Ambulatory Visit: Payer: Self-pay | Admitting: Cardiology

## 2012-06-25 DIAGNOSIS — S8990XA Unspecified injury of unspecified lower leg, initial encounter: Secondary | ICD-10-CM

## 2012-06-25 NOTE — Telephone Encounter (Signed)
Pt states that she tore or sprained a ligament in her knee over the weekend and is having trouble walking on it. Pt would like referral to see orthopedic specialist.

## 2012-06-26 NOTE — Telephone Encounter (Signed)
Order done

## 2012-06-26 NOTE — Telephone Encounter (Signed)
Left message for pt to callback office.  

## 2012-06-26 NOTE — Telephone Encounter (Signed)
Called mobile #, unavailable, unable to leave message.

## 2012-06-27 NOTE — Telephone Encounter (Signed)
Pt informed of referral

## 2012-06-28 ENCOUNTER — Inpatient Hospital Stay: Admission: RE | Admit: 2012-06-28 | Payer: Self-pay | Source: Ambulatory Visit

## 2012-07-02 ENCOUNTER — Inpatient Hospital Stay: Admission: RE | Admit: 2012-07-02 | Payer: Self-pay | Source: Ambulatory Visit

## 2012-07-03 ENCOUNTER — Ambulatory Visit: Payer: Medicare Other | Admitting: Internal Medicine

## 2012-07-03 ENCOUNTER — Other Ambulatory Visit: Payer: Self-pay

## 2012-07-03 DIAGNOSIS — Z0289 Encounter for other administrative examinations: Secondary | ICD-10-CM

## 2012-07-08 ENCOUNTER — Other Ambulatory Visit: Payer: Self-pay | Admitting: Pulmonary Disease

## 2012-07-09 ENCOUNTER — Encounter: Payer: Self-pay | Admitting: Pulmonary Disease

## 2012-07-09 MED ORDER — TIOTROPIUM BROMIDE MONOHYDRATE 18 MCG IN CAPS: 18.0000 ug | ORAL_CAPSULE | Freq: Every day | RESPIRATORY_TRACT | Status: DC

## 2012-07-09 NOTE — Telephone Encounter (Signed)
Pt last seen by [facility] 8.14.13 for hosp follow up > was to follow up after hand surgery.  Pt noshowed for appt x2.  Other than that, pt has followed up pretty regularly.  Will send refills x3 with a note that she is due for follow up.

## 2012-07-11 ENCOUNTER — Other Ambulatory Visit: Payer: Self-pay | Admitting: Internal Medicine

## 2012-07-11 DIAGNOSIS — Z1231 Encounter for screening mammogram for malignant neoplasm of breast: Secondary | ICD-10-CM

## 2012-07-18 ENCOUNTER — Ambulatory Visit
Admission: RE | Admit: 2012-07-18 | Discharge: 2012-07-18 | Disposition: A | Discharge status: Home / Self Care | Payer: Medicare Other | Source: Ambulatory Visit | Attending: Neurosurgery | Admitting: Neurosurgery

## 2012-07-18 DIAGNOSIS — M545 Low back pain: Secondary | ICD-10-CM

## 2012-07-18 MED ORDER — GADOBENATE DIMEGLUMINE 529 MG/ML IV SOLN
20.0000 mL | Freq: Once | INTRAVENOUS | Status: AC | PRN
  Administered 2012-07-18: 20 mL via INTRAVENOUS

## 2012-07-19 ENCOUNTER — Other Ambulatory Visit (INDEPENDENT_AMBULATORY_CARE_PROVIDER_SITE_OTHER): Payer: Medicare Other

## 2012-07-19 ENCOUNTER — Encounter: Payer: Self-pay | Admitting: Internal Medicine

## 2012-07-19 ENCOUNTER — Ambulatory Visit (INDEPENDENT_AMBULATORY_CARE_PROVIDER_SITE_OTHER): Payer: Medicare Other | Admitting: Internal Medicine

## 2012-07-19 VITALS — BP 112/72 | HR 90 | Temp 97.0°F | Ht 68.5 in | Wt 291.0 lb

## 2012-07-19 DIAGNOSIS — Z720 Tobacco use: Secondary | ICD-10-CM

## 2012-07-19 DIAGNOSIS — A64 Unspecified sexually transmitted disease: Secondary | ICD-10-CM

## 2012-07-19 DIAGNOSIS — K921 Melena: Secondary | ICD-10-CM

## 2012-07-19 DIAGNOSIS — M545 Low back pain: Secondary | ICD-10-CM

## 2012-07-19 DIAGNOSIS — Z23 Encounter for immunization: Secondary | ICD-10-CM

## 2012-07-19 DIAGNOSIS — F17200 Nicotine dependence, unspecified, uncomplicated: Secondary | ICD-10-CM

## 2012-07-19 DIAGNOSIS — J9610 Chronic respiratory failure, unspecified whether with hypoxia or hypercapnia: Secondary | ICD-10-CM

## 2012-07-19 DIAGNOSIS — R109 Unspecified abdominal pain: Secondary | ICD-10-CM

## 2012-07-19 DIAGNOSIS — IMO0001 Reserved for inherently not codable concepts without codable children: Secondary | ICD-10-CM

## 2012-07-19 LAB — CBC WITH DIFFERENTIAL/PLATELET
Basophils Relative: 0.9 % (ref 0.0–3.0)
Eosinophils Absolute: 0.3 10*3/uL (ref 0.0–0.7)
Eosinophils Relative: 3.5 % (ref 0.0–5.0)
HCT: 46.5 % — ABNORMAL HIGH (ref 36.0–46.0)
Lymphs Abs: 1.6 10*3/uL (ref 0.7–4.0)
MCHC: 32.7 g/dL (ref 30.0–36.0)
MCV: 80.1 fl (ref 78.0–100.0)
Monocytes Absolute: 0.6 10*3/uL (ref 0.1–1.0)
Neutrophils Relative %: 70.6 % (ref 43.0–77.0)
Platelets: 225 10*3/uL (ref 150.0–400.0)
RBC: 5.81 Mil/uL — ABNORMAL HIGH (ref 3.87–5.11)
WBC: 8.5 10*3/uL (ref 4.5–10.5)

## 2012-07-19 LAB — HIV ANTIBODY (ROUTINE TESTING W REFLEX): HIV: NONREACTIVE

## 2012-07-19 LAB — BASIC METABOLIC PANEL
BUN: 15 mg/dL (ref 6–23)
Calcium: 9.1 mg/dL (ref 8.4–10.5)
GFR: 78.77 mL/min (ref >60.00)
Glucose, Bld: 80 mg/dL (ref 70–99)

## 2012-07-19 MED ORDER — OXYCODONE HCL 10 MG PO TABS: 5.0000 mg | ORAL_TABLET | Freq: Three times a day (TID) | ORAL | Status: DC | PRN

## 2012-07-19 MED ORDER — VARENICLINE TARTRATE 1 MG PO TABS: 1.0000 mg | ORAL_TABLET | Freq: Two times a day (BID) | ORAL | Status: DC

## 2012-07-19 NOTE — Progress Notes (Signed)
Subjective:    [name], female    DOB: [birth year], 46 y.o.   MRN: [id number]  Abdominal Pain This is a recurrent problem. The current episode started in the past 7 days. The onset quality is gradual. The problem occurs intermittently. The problem has been waxing and waning. The pain is located in the generalized abdominal region and epigastric region. The pain is moderate. The quality of the pain is cramping and dull. The abdominal pain does not radiate. Associated symptoms include diarrhea, hematochezia and myalgias. Pertinent negatives include no constipation, fever, hematuria, melena, nausea or vomiting. The pain is aggravated by bowel movement and certain positions. The pain is relieved by nothing. She has tried nothing for the symptoms. Prior diagnostic workup includes GI consult, lower endoscopy and upper endoscopy (In [place] in 2010). There is no history of abdominal surgery, colon cancer, GERD, irritable bowel syndrome or ulcerative colitis.   also reviewed chronic medical issues:  chronic hypoxic resp failure - home O2 dep since 2010 - overlap with OSA/OHS and obst dz per pulm - BiPAP qhs - reports compliance with ongoing medical treatment and home O2 and no changes in medication dose or frequency. denies adverse side effects related to current therapy. Quit smoking 08/2010!!  hypertension - reports compliance with ongoing medical treatment and no changes in medication dose or frequency. denies adverse side effects related to current therapy - prior rash from dilt resolved  dCHF, chronic - IP echo 01/2011 reviewed: normal LVEF - reports compliance with ongoing medical treatment. denies adverse side effects related to current therapy. improved edema but increased dyspnea on exertion  - continues bid diuretic - eval by cards for same 02/2011 - to limit fluids <2L/d  morbid obesity - weights reviewed - often has fluid related weight variance but remains  obese  Anxiety/depression - chronic hx same with daily BZ use - changed from klonopin to xanax 07/2010 due to "too sleepy" - on sertraline - the patient reports compliance with medication(s) as prescribed. Denies adverse side effects.   dyslipidemia -started on simva spring 2011- reports intermittent compliance with ongoing medical treatment and no changes in medication dose or frequency. denies adverse side effects related to current therapy.   fibromyalgia - chronic pain - on daily narcotics for years - pain affects chest, back, legs and "whole body" - resumed flexeril  DM2, mild - dx clarified 3/13 - check cbgs BID - AM fasting <150, none over 200 since going home - started metformin and amaryl but stopped after 3 days due to severe diarrhea - now on Lantus pen, no hypoglycemia   Past Medical History  Diagnosis Date  . Sleep apnea     noncompliant w/ CPAP  . Hypertension   . Hyperlipidemia   . Chronic headache   . Fibromyalgia     daily narcotics  . Anxiety     hx chronic BZ use, stopped 07/2010  . Anemia   . Pulmonary sarcoidosis     unimpressive CT chest 2011  . Colonic polyp   . GERD (gastroesophageal reflux disease)   . ALLERGIC RHINITIS   . Asthma   . CHF (congestive heart failure)     Diastolic with fluid overload, May, 2012, LVEF 60%  . Morbid obesity   . Depression   . Panic attacks   . Diabetes mellitus   . COPD (chronic obstructive pulmonary disease)     on home O2, moderate airflow obstruction, suspect d/t emphysema  . Obesity  Review of Systems  Constitutional: Positive for fatigue. Negative for fever, chills and unexpected weight change.  Respiratory: Positive for cough and shortness of breath.   Cardiovascular: Negative for chest pain and leg swelling.  Gastrointestinal: Positive for abdominal pain, diarrhea and hematochezia. Negative for nausea, vomiting, constipation and melena.  Genitourinary: Negative for hematuria.  Musculoskeletal: Positive for  myalgias and back pain. Negative for joint swelling.       Objective:   Physical Exam  BP 112/72  Pulse 90  Temp 97 F (36.1 C) (Oral)  Ht 5' 8.5" (1.74 m)  Wt 291 lb (131.997 kg)  BMI 43.60 kg/m2  SpO2 91%  LMP 12/28/2010 Wt Readings from Last 3 Encounters:  07/19/12 291 lb (131.997 kg)  06/20/12 292 lb (132.45 kg)  05/10/12 284 lb (128.822 kg)   Constitutional: She appears well-developed and well-nourished. Morbidly obese.  Cardiovascular: Normal rate, regular rhythm and normal heart sounds.  BLE with trace edema Pulmonary/Chest: Breath sounds diminished bilateral bases but no respiratory distress. She has no wheezes. Abdomen: obese, SNTND + BS Rectal - defer today Psychiatric: She has a normal mood and affect. Her behavior is normal.   Lab Results  Component Value Date   WBC 6.8 06/12/2012   HGB 14.4 06/12/2012   HCT 44.7 06/12/2012   PLT 240 06/12/2012   GLUCOSE 103* 06/12/2012   CHOL 255* 02/13/2012   TRIG 133.0 02/13/2012   HDL 54.30 02/13/2012   LDLDIRECT 186.4 02/13/2012   LDLCALC  Value: 92        Total Cholesterol/HDL:CHD Risk Coronary Heart Disease Risk Table                     Men   Women  1/2 Average Risk   3.4   3.3  Average Risk       5.0   4.4  2 X Average Risk   9.6   7.1  3 X Average Risk  23.4   11.0        Use the calculated Patient Ratio above and the CHD Risk Table to determine the patient's CHD Risk.        ATP III CLASSIFICATION (LDL):  <100     mg/dL   Optimal  295-621  mg/dL   Near or Above                    Optimal  130-159  mg/dL   Borderline  308-657  mg/dL   High  >846     mg/dL   Very High 9/62/9528   ALT 48* 04/22/2012   AST 35 04/22/2012   NA 137 06/12/2012   K 4.2 06/12/2012   CL 97 06/12/2012   CREATININE 1.25* 06/12/2012   BUN 14 06/12/2012   CO2 30 06/12/2012   TSH 0.701 04/22/2012   HGBA1C 6.5 05/10/2012       Assessment & Plan:   abdominal pain, cramping with ?blood in stool - FH stomach ca -  check CBC now and refer to GI -  last  EGD/colo in [place] - ?2010, unremarkable per patient report but recommended followup in 3-5 years  Also see problem list. Medications and labs reviewed today.  Tobacco abuse - resumed smoking 6 weeks ago -  will re treat with chantix as did well for >2 years s/p tx until 05/2012 relapse

## 2012-07-19 NOTE — Assessment & Plan Note (Signed)
Chronic oxy for control of same exac by sciatica 01/2011 and again 06/2011> MRI with free fragment and encroachment on L4 root -  status post R L4-5 microdiscectomy 07/06/2011 > symptoms improved  s/p L CTR 04/2012- continue chronic pain mgmt as ongoing - refills provided

## 2012-07-19 NOTE — Patient Instructions (Addendum)
It was good to see you today. Test(s) ordered today. Your results will be released to MyChart (or called to you) after review, usually within 72hours after test completion. If any changes need to be made, you will be notified at that same time. we'll make referral to local gastroenterology for evaluation of stomach pain and bleeding. Our office will contact you regarding appointment(s) once made. Reviewed medications, updated list. Refills provided as requested Flu shot given today Followup in 3 months, call sooner if problemsYou Can Quit Smoking If you are ready to quit smoking or are thinking about it, congratulations! You have chosen to help yourself be healthier and live longer! There are lots of different ways to quit smoking. Nicotine gum, nicotine patches, a nicotine inhaler, or nicotine nasal spray can help with physical craving. Hypnosis, support groups, and medicines help break the habit of smoking. TIPS TO GET OFF AND STAY OFF CIGARETTES  Learn to predict your moods. Do not let a bad situation be your excuse to have a cigarette. Some situations in your life might tempt you to have a cigarette.   Ask friends and co-workers not to smoke around you.   Make your home smoke-free.   Never have "just one" cigarette. It leads to wanting another and another. Remind yourself of your decision to quit.   On a card, make a list of your reasons for not smoking. Read it at least the same number of times a day as you have a cigarette. Tell yourself everyday, "I do not want to smoke. I choose not to smoke."   Ask someone at home or work to help you with your plan to quit smoking.   Have something planned after you eat or have a cup of coffee. Take a walk or get other exercise to perk you up. This will help to keep you from overeating.   Try a relaxation exercise to calm you down and decrease your stress. Remember, you may be tense and nervous the first two weeks after you quit. This will pass.    Find new activities to keep your hands busy. Play with a pen, coin, or rubber band. Doodle or draw things on paper.   Brush your teeth right after eating. This will help cut down the craving for the taste of tobacco after meals. You can try mouthwash too.   Try gum, breath mints, or diet candy to keep something in your mouth.  IF YOU SMOKE AND WANT TO QUIT:  Do not stock up on cigarettes. Never buy a carton. Wait until one pack is finished before you buy another.   Never carry cigarettes with you at work or at home.   Keep cigarettes as far away from you as possible. Leave them with someone else.   Never carry matches or a lighter with you.   Ask yourself, "Do I need this cigarette or is this just a reflex?"   Bet with someone that you can quit. Put cigarette money in a piggy bank every morning. If you smoke, you give up the money. If you do not smoke, by the end of the week, you keep the money.   Keep trying. It takes 21 days to change a habit!   Talk to your doctor about using medicines to help you quit. These include nicotine replacement gum, lozenges, or skin patches.  Document Released: 07/08/2009 Document Revised: 12/04/2011 Document Reviewed: 07/08/2009 [facility] Patient Information 2013 [place], [place].

## 2012-07-20 ENCOUNTER — Encounter (HOSPITAL_COMMUNITY): Payer: Self-pay | Admitting: Emergency Medicine

## 2012-07-20 ENCOUNTER — Emergency Department (HOSPITAL_COMMUNITY)
Admission: EM | Admit: 2012-07-20 | Discharge: 2012-07-20 | Disposition: A | Discharge status: Home / Self Care | Payer: Medicare Other | Attending: Emergency Medicine | Admitting: Emergency Medicine

## 2012-07-20 ENCOUNTER — Emergency Department (HOSPITAL_COMMUNITY): Payer: Medicare Other

## 2012-07-20 DIAGNOSIS — F411 Generalized anxiety disorder: Secondary | ICD-10-CM | POA: Insufficient documentation

## 2012-07-20 DIAGNOSIS — E785 Hyperlipidemia, unspecified: Secondary | ICD-10-CM | POA: Insufficient documentation

## 2012-07-20 DIAGNOSIS — I10 Essential (primary) hypertension: Secondary | ICD-10-CM | POA: Insufficient documentation

## 2012-07-20 DIAGNOSIS — IMO0001 Reserved for inherently not codable concepts without codable children: Secondary | ICD-10-CM | POA: Insufficient documentation

## 2012-07-20 DIAGNOSIS — F3289 Other specified depressive episodes: Secondary | ICD-10-CM | POA: Insufficient documentation

## 2012-07-20 DIAGNOSIS — F329 Major depressive disorder, single episode, unspecified: Secondary | ICD-10-CM | POA: Insufficient documentation

## 2012-07-20 DIAGNOSIS — Z7982 Long term (current) use of aspirin: Secondary | ICD-10-CM | POA: Insufficient documentation

## 2012-07-20 DIAGNOSIS — J441 Chronic obstructive pulmonary disease with (acute) exacerbation: Secondary | ICD-10-CM

## 2012-07-20 DIAGNOSIS — I5030 Unspecified diastolic (congestive) heart failure: Secondary | ICD-10-CM | POA: Insufficient documentation

## 2012-07-20 DIAGNOSIS — K219 Gastro-esophageal reflux disease without esophagitis: Secondary | ICD-10-CM | POA: Insufficient documentation

## 2012-07-20 DIAGNOSIS — G4730 Sleep apnea, unspecified: Secondary | ICD-10-CM | POA: Insufficient documentation

## 2012-07-20 DIAGNOSIS — D649 Anemia, unspecified: Secondary | ICD-10-CM | POA: Insufficient documentation

## 2012-07-20 DIAGNOSIS — Z79899 Other long term (current) drug therapy: Secondary | ICD-10-CM | POA: Insufficient documentation

## 2012-07-20 DIAGNOSIS — E119 Type 2 diabetes mellitus without complications: Secondary | ICD-10-CM | POA: Insufficient documentation

## 2012-07-20 DIAGNOSIS — F17200 Nicotine dependence, unspecified, uncomplicated: Secondary | ICD-10-CM | POA: Insufficient documentation

## 2012-07-20 DIAGNOSIS — Z794 Long term (current) use of insulin: Secondary | ICD-10-CM | POA: Insufficient documentation

## 2012-07-20 DIAGNOSIS — J309 Allergic rhinitis, unspecified: Secondary | ICD-10-CM | POA: Insufficient documentation

## 2012-07-20 LAB — CBC WITH DIFFERENTIAL/PLATELET
Eosinophils Absolute: 0.3 10*3/uL (ref 0.0–0.7)
Eosinophils Relative: 4 % (ref 0–5)
HCT: 44.7 % (ref 36.0–46.0)
Lymphocytes Relative: 24 % (ref 12–46)
Lymphs Abs: 1.8 10*3/uL (ref 0.7–4.0)
MCH: 25.6 pg — ABNORMAL LOW (ref 26.0–34.0)
MCV: 80 fL (ref 78.0–100.0)
Monocytes Absolute: 0.5 10*3/uL (ref 0.1–1.0)
Monocytes Relative: 6 % (ref 3–12)
Platelets: 225 10*3/uL (ref 150–400)
RBC: 5.59 MIL/uL — ABNORMAL HIGH (ref 3.87–5.11)
WBC: 7.6 10*3/uL (ref 4.0–10.5)

## 2012-07-20 LAB — BASIC METABOLIC PANEL
BUN: 12 mg/dL (ref 6–23)
CO2: 31 mEq/L (ref 19–32)
Calcium: 9.8 mg/dL (ref 8.4–10.5)
Creatinine, Ser: 0.9 mg/dL (ref 0.50–1.10)
GFR calc non Af Amer: 76 mL/min — ABNORMAL LOW (ref >90)
Glucose, Bld: 100 mg/dL — ABNORMAL HIGH (ref 70–99)
Sodium: 137 mEq/L (ref 135–145)

## 2012-07-20 MED ORDER — PREDNISONE 20 MG PO TABS
60.0000 mg | ORAL_TABLET | Freq: Once | ORAL | Status: AC
  Administered 2012-07-20: 60 mg via ORAL
  Filled 2012-07-20: qty 3

## 2012-07-20 MED ORDER — ALBUTEROL SULFATE (5 MG/ML) 0.5% IN NEBU
5.0000 mg | INHALATION_SOLUTION | RESPIRATORY_TRACT | Status: AC
  Administered 2012-07-20 (×3): 5 mg via RESPIRATORY_TRACT
  Filled 2012-07-20 (×3): qty 1

## 2012-07-20 MED ORDER — PREDNISONE 50 MG PO TABS: 50.0000 mg | ORAL_TABLET | Freq: Every day | ORAL | Status: DC

## 2012-07-20 MED ORDER — METHYLPREDNISOLONE SODIUM SUCC 125 MG IJ SOLR
125.0000 mg | Freq: Once | INTRAMUSCULAR | Status: DC
  Filled 2012-07-20: qty 2

## 2012-07-20 MED ORDER — ASPIRIN 81 MG PO CHEW
162.0000 mg | CHEWABLE_TABLET | Freq: Once | ORAL | Status: AC
  Administered 2012-07-20: 162 mg via ORAL
  Filled 2012-07-20: qty 2

## 2012-07-20 MED ORDER — AZITHROMYCIN 250 MG PO TABS: 250.0000 mg | ORAL_TABLET | Freq: Every day | ORAL | Status: DC

## 2012-07-20 MED ORDER — IPRATROPIUM BROMIDE 0.02 % IN SOLN
0.5000 mg | Freq: Once | RESPIRATORY_TRACT | Status: AC
  Administered 2012-07-20: 0.5 mg via RESPIRATORY_TRACT
  Filled 2012-07-20: qty 2.5

## 2012-07-20 NOTE — ED Notes (Signed)
Per patient, SOB X 2 days-on home O2 at 2L-neb treatments with little results

## 2012-07-20 NOTE — ED Notes (Signed)
Received pt in rm 12, pt c/o shortness of breath that started today, pt sts hx of asthma, COPD, CHF and DM. Pt also sts for the last week she's been having abdominal pain and diarrhea.

## 2012-07-20 NOTE — ED Notes (Signed)
Writer informed pt of needing a urine sample, pt went to the bathroom and forgot about the urine sample, and urinated into the toilet.

## 2012-07-21 NOTE — ED Provider Notes (Addendum)
History     CSN: [id number]  Arrival date & time 07/20/12  1723   First MD Initiated Contact with Patient 07/20/12 1802      Chief Complaint  Patient presents with  . Shortness of Breath    (Consider location/radiation/quality/duration/timing/severity/associated sxs/prior treatment) HPI Comments: Pt with hx of COPD comes in with cc of sob. Has hx of COPD, on home O2 prn. Pt reports that she just started smoking, and the weather got cooler, and her boyfriend is using kerosine heater in the house - all of which could have ld to her current conditions. She is having some intermittent dib, with wheezing and there is a new cough as well which is mostly dry. Pt has no URi like sx. She has no hx of PE, DVT. She has hx of diastolic CHF, but she denies any worsening orthopnea, PND and there is no weight gain or unilateral swelling. Pt will have intermittent chest pain on the top of her chest - but she typically gets that when her breathing is difficult   Patient is a 46 y.o. female presenting with shortness of breath. The history is provided by the patient.  Shortness of Breath  Associated symptoms include chest pain, cough, shortness of breath and wheezing.    Past Medical History  Diagnosis Date  . Sleep apnea     noncompliant w/ CPAP  . Hypertension   . Hyperlipidemia   . Chronic headache   . Fibromyalgia     daily narcotics  . Anxiety     hx chronic BZ use, stopped 07/2010  . Anemia   . Pulmonary sarcoidosis     unimpressive CT chest 2011  . Colonic polyp   . GERD (gastroesophageal reflux disease)   . ALLERGIC RHINITIS   . Asthma   . CHF (congestive heart failure)     Diastolic with fluid overload, May, 2012, LVEF 60%  . Morbid obesity   . Depression   . Panic attacks   . Diabetes mellitus   . COPD (chronic obstructive pulmonary disease)     on home O2, moderate airflow obstruction, suspect d/t emphysema  . Obesity     Past Surgical History  Procedure Date  .  Polypectomy 2011  . Lumbar microdiscectomy 07/06/2011    R L4-5, stern  . Back surgery   . Carpal tunnel release     Family History  Problem Relation Age of Onset  . Hypertension Mother   . Emphysema Father   . Hypertension Father   . Stomach cancer Father   . Allergies Brother   . Hypertension Brother   . Stomach cancer Brother   . Heart disease Mother   . Heart disease Father   . Heart disease Brother     History  Substance Use Topics  . Smoking status: Current Every Day Smoker -- 1.0 packs/day for 29 years    Types: Cigarettes    Last Attempt to Quit: 09/14/2010  . Smokeless tobacco: Never Used   Comment: Resumed smoking September 2013  . Alcohol Use: Yes     occasionally    OB History    Grav Para Term Preterm Abortions TAB SAB Ect Mult Living                  Review of Systems  Constitutional: Positive for activity change.  HENT: Negative for facial swelling and neck pain.   Respiratory: Positive for cough, shortness of breath and wheezing.   Cardiovascular: Positive for chest pain.  Gastrointestinal: Negative for nausea, vomiting, abdominal pain, diarrhea, constipation, blood in stool and abdominal distention.  Genitourinary: Negative for hematuria and difficulty urinating.  Skin: Negative for color change.  Neurological: Negative for speech difficulty.  Hematological: Does not bruise/bleed easily.  Psychiatric/Behavioral: Negative for confusion.    Allergies  Ciprofloxacin; Latex; Sulfamethoxazole w-trimethoprim; Azithromycin; Doxycycline; and Metronidazole  Home Medications   Current Outpatient Rx  Name Route Sig Dispense Refill  . ALBUTEROL SULFATE HFA 108 (90 BASE) MCG/ACT IN AERS Inhalation Inhale 2 puffs into the lungs every 6 (six) hours as needed. For wheeze or shortness of breath    . ALBUTEROL SULFATE (2.5 MG/3ML) 0.083% IN NEBU Nebulization Take 2.5 mg by nebulization every 6 (six) hours as needed. For wheeze or shortness of breath    .  ALPRAZOLAM 1 MG PO TABS Oral Take 1 mg by mouth 2 (two) times daily as needed. anxiety    . ASPIRIN 81 MG PO TBEC Oral Take 81 mg by mouth daily.    . BUDESONIDE-FORMOTEROL FUMARATE 160-4.5 MCG/ACT IN AERO Inhalation Inhale 2 puffs into the lungs 2 (two) times daily.    Marland Kitchen CETIRIZINE HCL 10 MG PO TABS Oral Take 10 mg by mouth at bedtime.    Marland Kitchen DICLOFENAC EPOLAMINE 1.3 % TD PTCH Transdermal Place 1 patch onto the skin 2 (two) times daily.    Marland Kitchen ESOMEPRAZOLE MAGNESIUM 40 MG PO CPDR Oral Take 40 mg by mouth 2 (two) times daily.    Marland Kitchen GLIMEPIRIDE 1 MG PO TABS Oral Take 1 mg by mouth daily before breakfast.    . GLUCOSE BLOOD VI STRP  Use as instructed 100 each 2  . INSULIN GLARGINE 100 UNIT/ML [place] SOLN Subcutaneous Inject 8-16 Units into the skin at bedtime. If blood sugar is over 100, patient uses 16 units. If under 100, patient uses 8 units    . MOMETASONE FUROATE 50 MCG/ACT NA SUSP Nasal Place 2 sprays into the nose daily.    Letta Pate DELICA LANCETS FINE MISC  USE TO TEST DAILY BEFORE MEALS AND AT BEDTIME 100 each 2  . OXYCODONE HCL 10 MG PO TABS Oral Take 5-10 mg by mouth 3 (three) times daily as needed. pain    . POTASSIUM CHLORIDE CRYS ER 20 MEQ PO TBCR Oral Take 20 mEq by mouth 2 (two) times daily.     . SERTRALINE HCL 25 MG PO TABS Oral Take 25 mg by mouth every morning.     Marland Kitchen SPIRONOLACTONE 25 MG PO TABS Oral Take 25 mg by mouth every morning. Take 1 by mouth daily    . TIOTROPIUM BROMIDE MONOHYDRATE 18 MCG IN CAPS Inhalation Place 18 mcg into inhaler and inhale daily.    . TORSEMIDE 100 MG PO TABS Oral Take 100 mg by mouth 2 (two) times daily.    Marland Kitchen VARENICLINE TARTRATE 1 MG PO TABS Oral Take 1 tablet (1 mg total) by mouth 2 (two) times daily. 60 tablet 1  . AZITHROMYCIN 250 MG PO TABS Oral Take 1 tablet (250 mg total) by mouth daily. Take first 2 tablets together, then 1 every day until finished. 6 tablet 0  . PREDNISONE 50 MG PO TABS Oral Take 1 tablet (50 mg total) by mouth daily. 5 tablet 0      BP 128/91  Pulse 98  Temp 98.4 F (36.9 C)  Resp 20  SpO2 97%  LMP 12/28/2010  Physical Exam  Nursing note and vitals reviewed. Constitutional: She is oriented to person, place,  and time. She appears well-developed and well-nourished.  HENT:  Head: Normocephalic and atraumatic.  Eyes: EOM are normal. Pupils are equal, round, and reactive to light.  Neck: Neck supple.  Cardiovascular: Normal rate, regular rhythm and normal heart sounds.   No murmur heard. Pulmonary/Chest: Effort normal. No respiratory distress. She has wheezes.       Poor air entry bilaterally, No accessory muscle use, tachypnic, able to complete full sentences without having to stop, no retractions.    Abdominal: Soft. She exhibits no distension. There is no tenderness. There is no rebound and no guarding.  Neurological: She is alert and oriented to person, place, and time.  Skin: Skin is warm and dry.    ED Course  Procedures (including critical care time)  Labs Reviewed  CBC WITH DIFFERENTIAL - Abnormal; Notable for the following:    RBC 5.59 (*)     MCH 25.6 (*)     RDW 16.8 (*)     All other components within normal limits  BASIC METABOLIC PANEL - Abnormal; Notable for the following:    Chloride 95 (*)     Glucose, Bld 100 (*)     GFR calc non Af Amer 76 (*)     GFR calc Af Amer 88 (*)     All other components within normal limits  TROPONIN I  URINALYSIS, ROUTINE W REFLEX MICROSCOPIC   Dg Chest 2 View  07/20/2012  *RADIOLOGY REPORT*  Clinical Data: 46 year old female shortness of breath chest pain. Pulmonary sarcoidosis.  CHEST - 2 VIEW  Comparison: 06/12/2012 and earlier.  Findings: Stable lung volumes.  Stable widespread increased interstitial markings, most pronounced at both lung bases.  No pneumothorax.  No pleural effusion or pulmonary edema.  No acute pulmonary opacity.  Stable cardiac size and mediastinal contours. No acute osseous abnormality identified.  IMPRESSION: Stable. No acute  cardiopulmonary abnormality.   Original Report Authenticated By: Harley Hallmark, M.D.      1. COPD exacerbation       MDM  Differential diagnosis includes: ACS syndrome CHF exacerbation Valvular disorder Myocarditis Pericarditis Pericardial effusion Pneumonia Pleural effusion Pulmonary edema PE Anemia  Pt comes in with cc of sob. Her hx and exam are consistent with COPD like picture, and it appears that she has some triggers. Will get basic labs, will get some breathing treatments on boards and reassess.  12:27 AM Pt given 3 tx, feels better, thinks that her chest appears to have opened uo. Wheezing cleared. Labs are negative. Continues to be in no distress. Will discharge.     Date: 07/21/2012  Rate: 86  Rhythm: normal sinus rhythm  QRS Axis: normal  Intervals: normal  ST/T Wave abnormalities: normal, T wave inversion on v2  Conduction Disutrbances: none  Narrative Interpretation: unremarkable     Derwood Kaplan, MD 07/21/12 0028  Derwood Kaplan, MD 07/21/12 1610

## 2012-07-22 ENCOUNTER — Encounter: Payer: Self-pay | Admitting: Internal Medicine

## 2012-07-24 ENCOUNTER — Ambulatory Visit: Payer: Medicare Other

## 2012-07-24 ENCOUNTER — Telehealth: Payer: Self-pay | Admitting: *Deleted

## 2012-07-24 NOTE — Telephone Encounter (Signed)
Received fax on Monday pt needing PA on chantix & albuterol sol for nebulizer. Contacted insurance  PA was done over the phone. Received approval letter back on chantix. Albuterol solution was denied. Notified pharmacy...Raechel Chute

## 2012-07-24 NOTE — Telephone Encounter (Signed)
Noted same - Please have pt follow up with her pulmonary provider (Clance) on denial of Alb neb meds since I have been unable to get this approved - thanks

## 2012-07-24 NOTE — Telephone Encounter (Signed)
Called pt no answer LMOM md response...lmb 

## 2012-07-26 ENCOUNTER — Encounter: Payer: Self-pay | Admitting: *Deleted

## 2012-08-11 ENCOUNTER — Other Ambulatory Visit: Payer: Self-pay | Admitting: Internal Medicine

## 2012-08-13 ENCOUNTER — Ambulatory Visit: Payer: Self-pay

## 2012-08-16 ENCOUNTER — Other Ambulatory Visit: Payer: Self-pay

## 2012-08-16 MED ORDER — OXYCODONE HCL 10 MG PO TABS: 5.0000 mg | ORAL_TABLET | Freq: Three times a day (TID) | ORAL | Status: DC | PRN

## 2012-08-16 MED ORDER — ALPRAZOLAM 1 MG PO TABS: 1.0000 mg | ORAL_TABLET | Freq: Two times a day (BID) | ORAL | Status: DC | PRN

## 2012-08-16 NOTE — Telephone Encounter (Signed)
Called pt no answer LMOM rx ready for pick-up.../lmb 

## 2012-08-19 ENCOUNTER — Other Ambulatory Visit: Payer: Self-pay | Admitting: *Deleted

## 2012-08-19 MED ORDER — GLUCOSE BLOOD VI STRP: ORAL_STRIP | Status: DC

## 2012-08-20 ENCOUNTER — Ambulatory Visit: Payer: Self-pay | Admitting: Cardiology

## 2012-08-21 ENCOUNTER — Ambulatory Visit: Payer: Medicare Other | Admitting: Internal Medicine

## 2012-08-21 ENCOUNTER — Telehealth: Payer: Self-pay | Admitting: Internal Medicine

## 2012-08-21 NOTE — Telephone Encounter (Signed)
Patient Information:  Caller Name: Michal  Phone: [phone]  Patient: [name], [name]  Gender: Female  DOB: [birth year]  Age: 46 Years  PCP: Rene Paci (Adults only)  Pregnant: No   Symptoms  Reason For Call & Symptoms: headache.  Blood sugar 226.  Increaed fatigue.  States she is unable to find her amaryl pills, but did take her lantus.  Reviewed Health History In EMR: Yes  Reviewed Medications In EMR: Yes  Reviewed Allergies In EMR: Yes  Reviewed Surgeries / Procedures: Yes  Date of Onset of Symptoms: 08/21/2012 OB:  LMP: Unknown  Guideline(s) Used:  Diabetes - High Blood Sugar  Headache  Disposition Per Guideline:   See Today or Tomorrow in Office  Reason For Disposition Reached:   New headache and age > 61  Advice Given:  N/A  Office Follow Up:  Does the office need to follow up with this patient?: Yes  Instructions For The Office: cannot find new fill of amaryl Rx; will continue to look, and used lantus with good resolution of blood sugar krs/can  Patient Refused Recommendation:  Patient Will Follow Up With Office Later  refuses  offered appt today  RN Note:  Blood sugar 226 on arising AM 08/21/12.  On recheck per RN request 1430 08/21/12, BS was 154 after taking her lantus.  States she cannot find her new bottle of glymeperide; states she "just got it filled a few days ago."  Per protocol, advised and offered appt today; refuses appt.  States will call back if not improving on her own.

## 2012-08-27 ENCOUNTER — Other Ambulatory Visit: Payer: Self-pay | Admitting: Internal Medicine

## 2012-09-01 ENCOUNTER — Inpatient Hospital Stay (HOSPITAL_COMMUNITY)
Admission: EM | Admit: 2012-09-01 | Discharge: 2012-09-05 | DRG: 191 | Disposition: A | Discharge status: Home / Self Care | Payer: Medicare Other | Attending: Internal Medicine | Admitting: Internal Medicine

## 2012-09-01 ENCOUNTER — Encounter (HOSPITAL_COMMUNITY): Payer: Self-pay | Admitting: *Deleted

## 2012-09-01 ENCOUNTER — Emergency Department (HOSPITAL_COMMUNITY): Payer: Medicare Other

## 2012-09-01 DIAGNOSIS — M25561 Pain in right knee: Secondary | ICD-10-CM

## 2012-09-01 DIAGNOSIS — Z720 Tobacco use: Secondary | ICD-10-CM

## 2012-09-01 DIAGNOSIS — Z8249 Family history of ischemic heart disease and other diseases of the circulatory system: Secondary | ICD-10-CM

## 2012-09-01 DIAGNOSIS — M25461 Effusion, right knee: Secondary | ICD-10-CM

## 2012-09-01 DIAGNOSIS — D869 Sarcoidosis, unspecified: Secondary | ICD-10-CM | POA: Diagnosis present

## 2012-09-01 DIAGNOSIS — Z888 Allergy status to other drugs, medicaments and biological substances status: Secondary | ICD-10-CM

## 2012-09-01 DIAGNOSIS — M25469 Effusion, unspecified knee: Secondary | ICD-10-CM

## 2012-09-01 DIAGNOSIS — Z7982 Long term (current) use of aspirin: Secondary | ICD-10-CM

## 2012-09-01 DIAGNOSIS — Z794 Long term (current) use of insulin: Secondary | ICD-10-CM

## 2012-09-01 DIAGNOSIS — Z79899 Other long term (current) drug therapy: Secondary | ICD-10-CM

## 2012-09-01 DIAGNOSIS — E119 Type 2 diabetes mellitus without complications: Secondary | ICD-10-CM | POA: Diagnosis present

## 2012-09-01 DIAGNOSIS — Z6841 Body Mass Index (BMI) 40.0 and over, adult: Secondary | ICD-10-CM

## 2012-09-01 DIAGNOSIS — F17200 Nicotine dependence, unspecified, uncomplicated: Secondary | ICD-10-CM | POA: Diagnosis present

## 2012-09-01 DIAGNOSIS — J441 Chronic obstructive pulmonary disease with (acute) exacerbation: Principal | ICD-10-CM | POA: Diagnosis present

## 2012-09-01 DIAGNOSIS — R0603 Acute respiratory distress: Secondary | ICD-10-CM

## 2012-09-01 DIAGNOSIS — Z23 Encounter for immunization: Secondary | ICD-10-CM

## 2012-09-01 DIAGNOSIS — M1710 Unilateral primary osteoarthritis, unspecified knee: Secondary | ICD-10-CM | POA: Diagnosis present

## 2012-09-01 DIAGNOSIS — K219 Gastro-esophageal reflux disease without esophagitis: Secondary | ICD-10-CM | POA: Diagnosis present

## 2012-09-01 DIAGNOSIS — IMO0002 Reserved for concepts with insufficient information to code with codable children: Secondary | ICD-10-CM

## 2012-09-01 DIAGNOSIS — J99 Respiratory disorders in diseases classified elsewhere: Secondary | ICD-10-CM | POA: Diagnosis present

## 2012-09-01 DIAGNOSIS — I10 Essential (primary) hypertension: Secondary | ICD-10-CM | POA: Diagnosis present

## 2012-09-01 LAB — COMPREHENSIVE METABOLIC PANEL
ALT: 23 U/L (ref 0–35)
AST: 22 U/L (ref 0–37)
CO2: 28 mEq/L (ref 19–32)
Calcium: 9.1 mg/dL (ref 8.4–10.5)
Creatinine, Ser: 1.09 mg/dL (ref 0.50–1.10)
GFR calc Af Amer: 70 mL/min — ABNORMAL LOW (ref >90)
GFR calc non Af Amer: 60 mL/min — ABNORMAL LOW (ref >90)
Glucose, Bld: 106 mg/dL — ABNORMAL HIGH (ref 70–99)
Sodium: 137 mEq/L (ref 135–145)
Total Protein: 7.3 g/dL (ref 6.0–8.3)

## 2012-09-01 LAB — CBC
Hemoglobin: 13.2 g/dL (ref 12.0–15.0)
MCH: 25.9 pg — ABNORMAL LOW (ref 26.0–34.0)
Platelets: 197 10*3/uL (ref 150–400)
RBC: 5.09 MIL/uL (ref 3.87–5.11)
WBC: 10.9 10*3/uL — ABNORMAL HIGH (ref 4.0–10.5)

## 2012-09-01 LAB — CBC WITH DIFFERENTIAL/PLATELET
Basophils Absolute: 0 10*3/uL (ref 0.0–0.1)
Eosinophils Absolute: 0.4 10*3/uL (ref 0.0–0.7)
Eosinophils Relative: 4 % (ref 0–5)
HCT: 42.1 % (ref 36.0–46.0)
Lymphocytes Relative: 15 % (ref 12–46)
MCH: 25.9 pg — ABNORMAL LOW (ref 26.0–34.0)
MCV: 79 fL (ref 78.0–100.0)
Monocytes Absolute: 0.7 10*3/uL (ref 0.1–1.0)
Platelets: 207 10*3/uL (ref 150–400)
RDW: 16.3 % — ABNORMAL HIGH (ref 11.5–15.5)

## 2012-09-01 LAB — GLUCOSE, CAPILLARY
Glucose-Capillary: 177 mg/dL — ABNORMAL HIGH (ref 70–99)
Glucose-Capillary: 111 mg/dL — ABNORMAL HIGH (ref 70–99)

## 2012-09-01 LAB — CREATININE, SERUM
Creatinine, Ser: 0.9 mg/dL (ref 0.50–1.10)
GFR calc Af Amer: 88 mL/min — ABNORMAL LOW (ref >90)
GFR calc non Af Amer: 76 mL/min — ABNORMAL LOW (ref >90)

## 2012-09-01 MED ORDER — ALBUTEROL SULFATE (5 MG/ML) 0.5% IN NEBU: 2.5000 mg | INHALATION_SOLUTION | RESPIRATORY_TRACT | Status: DC | PRN

## 2012-09-01 MED ORDER — SPIRONOLACTONE 25 MG PO TABS
25.0000 mg | ORAL_TABLET | Freq: Every morning | ORAL | Status: DC
  Administered 2012-09-01 – 2012-09-05 (×5): 25 mg via ORAL
  Filled 2012-09-01 (×5): qty 1

## 2012-09-01 MED ORDER — ASPIRIN EC 81 MG PO TBEC
81.0000 mg | DELAYED_RELEASE_TABLET | Freq: Every day | ORAL | Status: DC
  Administered 2012-09-01 – 2012-09-05 (×5): 81 mg via ORAL
  Filled 2012-09-01 (×5): qty 1

## 2012-09-01 MED ORDER — PNEUMOCOCCAL VAC POLYVALENT 25 MCG/0.5ML IJ INJ
0.5000 mL | INJECTION | INTRAMUSCULAR | Status: AC
  Administered 2012-09-02: 0.5 mL via INTRAMUSCULAR
  Filled 2012-09-01: qty 0.5

## 2012-09-01 MED ORDER — POTASSIUM CHLORIDE CRYS ER 20 MEQ PO TBCR
20.0000 meq | EXTENDED_RELEASE_TABLET | Freq: Two times a day (BID) | ORAL | Status: DC
  Administered 2012-09-01 – 2012-09-05 (×9): 20 meq via ORAL
  Filled 2012-09-01 (×11): qty 1

## 2012-09-01 MED ORDER — ALBUTEROL SULFATE (5 MG/ML) 0.5% IN NEBU
2.5000 mg | INHALATION_SOLUTION | Freq: Four times a day (QID) | RESPIRATORY_TRACT | Status: DC
  Administered 2012-09-01 – 2012-09-05 (×15): 2.5 mg via RESPIRATORY_TRACT
  Filled 2012-09-01 (×14): qty 0.5

## 2012-09-01 MED ORDER — OXYCODONE-ACETAMINOPHEN 5-325 MG PO TABS
2.0000 | ORAL_TABLET | Freq: Once | ORAL | Status: AC
  Administered 2012-09-01: 2 via ORAL
  Filled 2012-09-01: qty 2

## 2012-09-01 MED ORDER — PANTOPRAZOLE SODIUM 40 MG PO TBEC
40.0000 mg | DELAYED_RELEASE_TABLET | Freq: Every day | ORAL | Status: DC
  Administered 2012-09-01 – 2012-09-05 (×4): 40 mg via ORAL
  Filled 2012-09-01 (×5): qty 1

## 2012-09-01 MED ORDER — GLIMEPIRIDE 1 MG PO TABS
1.0000 mg | ORAL_TABLET | Freq: Every day | ORAL | Status: DC
  Administered 2012-09-02 – 2012-09-05 (×4): 1 mg via ORAL
  Filled 2012-09-01 (×5): qty 1

## 2012-09-01 MED ORDER — INSULIN GLARGINE 100 UNIT/ML ~~LOC~~ SOLN
16.0000 [IU] | Freq: Every day | SUBCUTANEOUS | Status: DC
  Administered 2012-09-01 – 2012-09-04 (×4): 16 [IU] via SUBCUTANEOUS

## 2012-09-01 MED ORDER — OXYCODONE HCL 5 MG PO TABS
5.0000 mg | ORAL_TABLET | ORAL | Status: DC | PRN
  Administered 2012-09-01 – 2012-09-03 (×5): 5 mg via ORAL
  Filled 2012-09-01 (×5): qty 1

## 2012-09-01 MED ORDER — SODIUM CHLORIDE 0.9 % IV SOLN: 250.0000 mL | INTRAVENOUS | Status: DC | PRN

## 2012-09-01 MED ORDER — TORSEMIDE 100 MG PO TABS
100.0000 mg | ORAL_TABLET | Freq: Two times a day (BID) | ORAL | Status: DC
  Administered 2012-09-01 – 2012-09-05 (×7): 100 mg via ORAL
  Filled 2012-09-01 (×11): qty 1

## 2012-09-01 MED ORDER — MORPHINE SULFATE 2 MG/ML IJ SOLN
1.0000 mg | INTRAMUSCULAR | Status: DC | PRN
  Administered 2012-09-02 – 2012-09-03 (×4): 1 mg via INTRAVENOUS
  Filled 2012-09-01 (×4): qty 1

## 2012-09-01 MED ORDER — BUDESONIDE-FORMOTEROL FUMARATE 160-4.5 MCG/ACT IN AERO
2.0000 | INHALATION_SPRAY | Freq: Two times a day (BID) | RESPIRATORY_TRACT | Status: DC
  Administered 2012-09-01 – 2012-09-05 (×8): 2 via RESPIRATORY_TRACT
  Filled 2012-09-01: qty 6

## 2012-09-01 MED ORDER — ENOXAPARIN SODIUM 40 MG/0.4ML ~~LOC~~ SOLN
40.0000 mg | SUBCUTANEOUS | Status: DC
  Administered 2012-09-01 – 2012-09-05 (×5): 40 mg via SUBCUTANEOUS
  Filled 2012-09-01 (×5): qty 0.4

## 2012-09-01 MED ORDER — IPRATROPIUM BROMIDE 0.02 % IN SOLN
0.5000 mg | Freq: Four times a day (QID) | RESPIRATORY_TRACT | Status: DC
  Administered 2012-09-01 – 2012-09-05 (×15): 0.5 mg via RESPIRATORY_TRACT
  Filled 2012-09-01 (×14): qty 2.5

## 2012-09-01 MED ORDER — ALBUTEROL (5 MG/ML) CONTINUOUS INHALATION SOLN
10.0000 mg/h | INHALATION_SOLUTION | RESPIRATORY_TRACT | Status: DC
  Administered 2012-09-01: 10 mg/h via RESPIRATORY_TRACT
  Filled 2012-09-01: qty 20

## 2012-09-01 MED ORDER — PREDNISONE 20 MG PO TABS
40.0000 mg | ORAL_TABLET | Freq: Once | ORAL | Status: AC
  Administered 2012-09-01: 40 mg via ORAL
  Filled 2012-09-01: qty 2

## 2012-09-01 MED ORDER — NICOTINE 21 MG/24HR TD PT24
21.0000 mg | MEDICATED_PATCH | Freq: Every day | TRANSDERMAL | Status: DC
  Administered 2012-09-01 – 2012-09-05 (×5): 21 mg via TRANSDERMAL
  Filled 2012-09-01 (×5): qty 1

## 2012-09-01 MED ORDER — INSULIN ASPART 100 UNIT/ML ~~LOC~~ SOLN
0.0000 [IU] | Freq: Three times a day (TID) | SUBCUTANEOUS | Status: DC
  Administered 2012-09-01: 4 [IU] via SUBCUTANEOUS
  Administered 2012-09-02: 6 [IU] via SUBCUTANEOUS
  Administered 2012-09-02: 3 [IU] via SUBCUTANEOUS
  Administered 2012-09-02: 4 [IU] via SUBCUTANEOUS
  Administered 2012-09-03 (×3): 7 [IU] via SUBCUTANEOUS
  Administered 2012-09-04: 4 [IU] via SUBCUTANEOUS
  Administered 2012-09-04: 7 [IU] via SUBCUTANEOUS
  Administered 2012-09-05: 4 [IU] via SUBCUTANEOUS
  Administered 2012-09-05: 3 [IU] via SUBCUTANEOUS

## 2012-09-01 MED ORDER — IOHEXOL 350 MG/ML SOLN
80.0000 mL | Freq: Once | INTRAVENOUS | Status: AC | PRN
  Administered 2012-09-01: 80 mL via INTRAVENOUS

## 2012-09-01 MED ORDER — ACETAMINOPHEN 325 MG PO TABS
650.0000 mg | ORAL_TABLET | Freq: Four times a day (QID) | ORAL | Status: DC | PRN
  Administered 2012-09-01 – 2012-09-05 (×3): 650 mg via ORAL
  Filled 2012-09-01 (×4): qty 2

## 2012-09-01 MED ORDER — ASPIRIN 81 MG PO TBEC: 81.0000 mg | DELAYED_RELEASE_TABLET | Freq: Every day | ORAL | Status: DC

## 2012-09-01 MED ORDER — SODIUM CHLORIDE 0.9 % IJ SOLN: 3.0000 mL | INTRAMUSCULAR | Status: DC | PRN

## 2012-09-01 MED ORDER — ALBUTEROL (5 MG/ML) CONTINUOUS INHALATION SOLN
10.0000 mg/h | INHALATION_SOLUTION | RESPIRATORY_TRACT | Status: DC
  Administered 2012-09-01: 10 mg/h via RESPIRATORY_TRACT

## 2012-09-01 MED ORDER — SERTRALINE HCL 25 MG PO TABS
25.0000 mg | ORAL_TABLET | Freq: Every morning | ORAL | Status: DC
  Administered 2012-09-01 – 2012-09-05 (×5): 25 mg via ORAL
  Filled 2012-09-01 (×5): qty 1

## 2012-09-01 MED ORDER — FLUTICASONE PROPIONATE 50 MCG/ACT NA SUSP
2.0000 | Freq: Every day | NASAL | Status: DC
  Administered 2012-09-01 – 2012-09-05 (×5): 2 via NASAL
  Filled 2012-09-01: qty 16

## 2012-09-01 MED ORDER — METHYLPREDNISOLONE SODIUM SUCC 125 MG IJ SOLR
80.0000 mg | Freq: Three times a day (TID) | INTRAMUSCULAR | Status: DC
  Administered 2012-09-01 – 2012-09-02 (×3): 80 mg via INTRAVENOUS
  Administered 2012-09-02: 23:00:00 via INTRAVENOUS
  Administered 2012-09-02 – 2012-09-03 (×2): 80 mg via INTRAVENOUS
  Filled 2012-09-01 (×9): qty 1.28

## 2012-09-01 MED ORDER — LORATADINE 10 MG PO TABS
10.0000 mg | ORAL_TABLET | Freq: Every day | ORAL | Status: DC
  Administered 2012-09-01 – 2012-09-05 (×5): 10 mg via ORAL
  Filled 2012-09-01 (×5): qty 1

## 2012-09-01 MED ORDER — ACETAMINOPHEN 650 MG RE SUPP: 650.0000 mg | Freq: Four times a day (QID) | RECTAL | Status: DC | PRN

## 2012-09-01 MED ORDER — ALPRAZOLAM 0.5 MG PO TABS
1.0000 mg | ORAL_TABLET | Freq: Two times a day (BID) | ORAL | Status: DC | PRN
  Administered 2012-09-01 – 2012-09-04 (×7): 1 mg via ORAL
  Administered 2012-09-04: 0.5 mg via ORAL
  Administered 2012-09-04 – 2012-09-05 (×2): 1 mg via ORAL
  Filled 2012-09-01: qty 1
  Filled 2012-09-01 (×5): qty 2
  Filled 2012-09-01 (×2): qty 1
  Filled 2012-09-01 (×2): qty 2
  Filled 2012-09-01: qty 1

## 2012-09-01 MED ORDER — INSULIN ASPART 100 UNIT/ML ~~LOC~~ SOLN
6.0000 [IU] | Freq: Three times a day (TID) | SUBCUTANEOUS | Status: DC
  Administered 2012-09-01 – 2012-09-05 (×13): 6 [IU] via SUBCUTANEOUS

## 2012-09-01 MED ORDER — SODIUM CHLORIDE 0.9 % IJ SOLN
3.0000 mL | Freq: Two times a day (BID) | INTRAMUSCULAR | Status: DC
  Administered 2012-09-01 – 2012-09-05 (×7): 3 mL via INTRAVENOUS

## 2012-09-01 NOTE — ED Notes (Signed)
Sob and bi-lateral knee swelling since yesterday.  Knee pain only.  Exertional sob

## 2012-09-01 NOTE — H&P (Signed)
[facility]          History and Physical    PCP:   Rene Paci, MD   Chief Complaint:  SOB and cough   HPI: Pleasant 46 y/o woman with known history of COPD and pulmonary sarcoidosis who began smoking again 1 month ago. She began having a dry cough with SOB the day prior to admission. She also developed swelling of her right knee. Came to the hospital for evaluation. She was found to have diffuse wheezing on exam that did not abate after nebs given in the ED. Her right knee was tapped. We have been asked to admit her for further evaluation and management.  Allergies:   Allergies  Allergen Reactions  . Ciprofloxacin     REACTION: nausea  . Latex     REACTION: rash, burning  . Sulfamethoxazole W-Trimethoprim     Bactrim REACTION: Projectile Vomiting  . Azithromycin     REACTION: resistant  . Doxycycline     REACTION: restistant  . Metformin And Related Other (See Comments)    Severe diarrhea  . Metronidazole Nausea And Vomiting      Past Medical History  Diagnosis Date  . Sleep apnea     noncompliant w/ CPAP  . Hypertension   . Hyperlipidemia   . Chronic headache   . Fibromyalgia     daily narcotics  . Anxiety     hx chronic BZ use, stopped 07/2010  . Anemia   . Pulmonary sarcoidosis     unimpressive CT chest 2011  . Colonic polyp   . GERD (gastroesophageal reflux disease)   . ALLERGIC RHINITIS   . Asthma   . CHF (congestive heart failure)     Diastolic with fluid overload, May, 2012, LVEF 60%  . Morbid obesity   . Depression   . Panic attacks   . Diabetes mellitus   . COPD (chronic obstructive pulmonary disease)     on home O2, moderate airflow obstruction, suspect d/t emphysema  . Obesity   . Elevated LFTs 09/2011  . Ovarian cyst     Past Surgical History  Procedure Date  . Polypectomy 2011  . Lumbar microdiscectomy 07/06/2011    R L4-5, stern  . Back surgery   . Carpal tunnel release     Prior to Admission medications    Medication Sig Start Date End Date Taking? Authorizing Provider  albuterol (PROVENTIL HFA;VENTOLIN HFA) 108 (90 BASE) MCG/ACT inhaler Inhale 2 puffs into the lungs every 6 (six) hours as needed. For wheeze or shortness of breath   Yes Historical Provider, MD  albuterol (PROVENTIL) (2.5 MG/3ML) 0.083% nebulizer solution Take 2.5 mg by nebulization every 6 (six) hours as needed. For wheeze or shortness of breath   Yes Historical Provider, MD  ALPRAZolam (XANAX) 1 MG tablet Take 1 tablet (1 mg total) by mouth 2 (two) times daily as needed. anxiety 08/16/12  Yes Newt Lukes, MD  aspirin 81 MG EC tablet Take 81 mg by mouth daily. 12/17/11 12/16/12 Yes Altha Harm, MD  budesonide-formoterol [facility]) 160-4.5 MCG/ACT inhaler Inhale 2 puffs into the lungs 2 (two) times daily.   Yes Historical Provider, MD  cetirizine (ZYRTEC) 10 MG tablet Take 10 mg by mouth at bedtime. 11/24/11 11/23/12 Yes Newt Lukes, MD  esomeprazole (NEXIUM) 40 MG capsule Take 40 mg by mouth 2 (two) times daily.   Yes Historical Provider, MD  glimepiride (AMARYL) 1 MG tablet Take 1 mg by mouth daily before breakfast.  Yes Historical Provider, MD  insulin glargine (LANTUS) 100 UNIT/ML injection Inject 8-16 Units into the skin at bedtime. If blood sugar is over 100, patient uses 16 units. If under 100, patient uses 8 units   Yes Historical Provider, MD  mometasone (NASONEX) 50 MCG/ACT nasal spray Place 2 sprays into the nose daily.   Yes Historical Provider, MD  nicotine (NICODERM CQ - DOSED IN MG/24 HOURS) 21 mg/24hr patch Place 1 patch onto the skin daily.   Yes Historical Provider, MD  Oxycodone HCl 10 MG TABS Take 0.5-1 tablets (5-10 mg total) by mouth 3 (three) times daily as needed. Do not fill before 26th of month 08/16/12  Yes Newt Lukes, MD  potassium chloride SA (K-DUR,KLOR-CON) 20 MEQ tablet Take 20 mEq by mouth 2 (two) times daily.  05/26/11  Yes Newt Lukes, MD  sertraline (ZOLOFT) 25 MG  tablet Take 25 mg by mouth every morning.    Yes Historical Provider, MD  spironolactone (ALDACTONE) 25 MG tablet Take 25 mg by mouth every morning. Take 1 by mouth daily 07/18/11  Yes Historical Provider, MD  tiotropium (SPIRIVA) 18 MCG inhalation capsule Place 18 mcg into inhaler and inhale daily.   Yes Historical Provider, MD  torsemide (DEMADEX) 100 MG tablet Take 100 mg by mouth 2 (two) times daily.   Yes Historical Provider, MD    Social History:  reports that she has been smoking Cigarettes.  She has a 29 pack-year smoking history. She has never used smokeless tobacco. She reports that she drinks alcohol. She reports that she does not use illicit drugs.  Family History  Problem Relation Age of Onset  . Hypertension Mother   . Emphysema Father   . Hypertension Father   . Stomach cancer Father   . Allergies Brother   . Hypertension Brother   . Stomach cancer Brother   . Heart disease Mother   . Heart disease Father   . Heart disease Brother     Review of Systems:  Constitutional: Denies fever, chills, diaphoresis, appetite change and fatigue.  HEENT: Denies photophobia, eye pain, redness, hearing loss, ear pain, congestion, sore throat, rhinorrhea, sneezing, mouth sores, trouble swallowing, neck pain, neck stiffness and tinnitus.   Respiratory: Denies chest tightness.  Cardiovascular: Denies chest pain, palpitations and leg swelling.  Gastrointestinal: Denies nausea, vomiting, abdominal pain, diarrhea, constipation, blood in stool and abdominal distention.  Genitourinary: Denies dysuria, urgency, frequency, hematuria, flank pain and difficulty urinating.  Musculoskeletal: Denies myalgias, back pain, joint swelling, arthralgias and gait problem.  Skin: Denies pallor, rash and wound.  Neurological: Denies dizziness, seizures, syncope, weakness, light-headedness, numbness and headaches.  Hematological: Denies adenopathy. Easy bruising, personal or family bleeding history   Psychiatric/Behavioral: Denies suicidal ideation, mood changes, confusion, nervousness, sleep disturbance and agitation   Physical Exam: Blood pressure 119/56, pulse 81, temperature 98 F (36.7 C), temperature source Oral, resp. rate 24, last menstrual period 12/28/2010, SpO2 99.00%. Gen: AA Ox3, in no current distress, morbidly obese. HEENT: [place]/AT/PERRL/EOMI Neck: supple, no JVD, no LAD, no bruits, no goiter. CV: RRR, no M/R/G Lungs: mild bilateral expiratory wheezes. Abd: obese, soft, nontender, nondistended, +BS, no masses or organomegaly noted. Ext: trace bilateral pitting edema. Right knee wrapped in ACE bandage. Neuro: grossly intact and non-focal. I have not ambulated her.  Labs on Admission:  Results for orders placed during the hospital encounter of 09/01/12 (from the past 48 hour(s))  CBC WITH DIFFERENTIAL     Status: Abnormal   Collection Time  09/01/12 12:30 AM      Component Value Range Comment   WBC 10.0  4.0 - 10.5 K/uL    RBC 5.33 (*) 3.87 - 5.11 MIL/uL    Hemoglobin 13.8  12.0 - 15.0 g/dL    HCT 40.9  81.1 - 91.4 %    MCV 79.0  78.0 - 100.0 fL    MCH 25.9 (*) 26.0 - 34.0 pg    MCHC 32.8  30.0 - 36.0 g/dL    RDW 78.2 (*) 95.6 - 15.5 %    Platelets 207  150 - 400 K/uL    Neutrophils Relative 73  43 - 77 %    Neutro Abs 7.3  1.7 - 7.7 K/uL    Lymphocytes Relative 15  12 - 46 %    Lymphs Abs 1.5  0.7 - 4.0 K/uL    Monocytes Relative 7  3 - 12 %    Monocytes Absolute 0.7  0.1 - 1.0 K/uL    Eosinophils Relative 4  0 - 5 %    Eosinophils Absolute 0.4  0.0 - 0.7 K/uL    Basophils Relative 0  0 - 1 %    Basophils Absolute 0.0  0.0 - 0.1 K/uL   COMPREHENSIVE METABOLIC PANEL     Status: Abnormal   Collection Time   09/01/12 12:30 AM      Component Value Range Comment   Sodium 137  135 - 145 mEq/L    Potassium 3.4 (*) 3.5 - 5.1 mEq/L    Chloride 99  96 - 112 mEq/L    CO2 28  19 - 32 mEq/L    Glucose, Bld 106 (*) 70 - 99 mg/dL    BUN 15  6 - 23 mg/dL     Creatinine, Ser 2.13  0.50 - 1.10 mg/dL    Calcium 9.1  8.4 - 08.6 mg/dL    Total Protein 7.3  6.0 - 8.3 g/dL    Albumin 3.3 (*) 3.5 - 5.2 g/dL    AST 22  0 - 37 U/L    ALT 23  0 - 35 U/L    Alkaline Phosphatase 130 (*) 39 - 117 U/L    Total Bilirubin 0.3  0.3 - 1.2 mg/dL    GFR calc non Af Amer 60 (*) >90 mL/min    GFR calc Af Amer 70 (*) >90 mL/min   TROPONIN I     Status: Normal   Collection Time   09/01/12 12:30 AM      Component Value Range Comment   Troponin I <0.30  <0.30 ng/mL     Radiological Exams on Admission: Dg Chest 2 View  09/01/2012  *RADIOLOGY REPORT*  Clinical Data: Shortness of breath.  History of COPD.  CHEST - 2 VIEW  Comparison: 07/20/2012.  Findings: The cardiac silhouette remains borderline enlarged. Stable prominence of the interstitial markings and stable linear densities in both lower lung zones.  Unremarkable bones.  IMPRESSION:  1.  No acute abnormality. 2.  Stable chronic interstitial lung disease and bilateral scarring.   Original Report Authenticated By: Beckie Salts, M.D.    Ct Angio Chest Pe W/cm &/or Wo Cm  09/01/2012  *RADIOLOGY REPORT*  Clinical Data: Shortness of breath.  CT ANGIOGRAPHY CHEST  Technique:  Multidetector CT imaging of the chest using the standard protocol during bolus administration of intravenous contrast. Multiplanar reconstructed images including MIPs were obtained and reviewed to evaluate the vascular anatomy.  Contrast: 80mL OMNIPAQUE IOHEXOL 350 MG/ML SOLN  Comparison: 11/18/2010  Findings: No filling defects in the pulmonary arteries to suggest pulmonary emboli.  Heart is heart is borderline in size. Previously seen mediastinal adenopathy has improved.  No enlarged mediastinal, hilar or axillary lymph nodes currently. Emphysematous changes within the lungs.  Scarring in the bases.  No acute opacities or effusions.  Imaging into the upper abdomen shows no acute findings.  No acute bony abnormality.  IMPRESSION: No evidence of pulmonary  embolus.  Emphysematous changes.  Scarring in the bases.   Original Report Authenticated By: Charlett Nose, M.D.     Assessment/Plan Principal Problem:  *COPD with acute exacerbation Active Problems:  Tobacco abuse  PULMONARY SARCOIDOSIS  Morbid obesity  HYPERTENSION  GERD  DM type 2 (diabetes mellitus, type 2)  Knee pain, right   COPD with Acute Exacerbation -Nebs, IV steroids. -Will withhold antibiotics given she is afebrile, no leukocytosis and has multiple antibiotic allergies. -Continue symbicort. -Restart Spiriva at DC.  Tobacco Abuse -Counseled. -Nicotine patch.  GERD -Continue PPI.  DM II -Continue lantus and SSI.  HTN -Continue home meds.  Right Knee Swelling/Pain -S/p arthrocentesis in the ED. -Main issue was edema and not pain. -Suspect related to OA from strain from obesity.  DVT Prophylaxis -Lovenox.  Code Status -Full code.   Time Spent on Admission: 75 minutes.  Chaya Jan [facility] Pager: [phone] 09/01/2012, 8:35 AM

## 2012-09-01 NOTE — ED Notes (Signed)
Right knee cleaned, sterile dressing and ace wrap applied with instructions for home care provided

## 2012-09-01 NOTE — ED Provider Notes (Signed)
History     CSN: [id number]  Arrival date & time 09/01/12  0020   First MD Initiated Contact with Patient 09/01/12 0046      Chief Complaint  Patient presents with  . Shortness of Breath    (Consider location/radiation/quality/duration/timing/severity/associated sxs/prior treatment) HPI Comments: 46 year old female with a history of COPD as well as diabetes, congestive heart failure, morbid obesity, pulmonary sarcoidosis and hypertension. She presents with worsening shortness of breath which has been going on for several days, today the shortness of breath became much worse but is not associated with significant coughing fevers or chills. She does have some nausea and notes that her right knee is very swollen. She has had swelling in her left knee in the past and required arthrocentesis for draining. She does note that recently she started smoking again but the last 3 days she has been on patches and stopped. She does have swelling in her legs, mostly around her knee. She denies any travel, trauma, immobilization, recent surgery, history of cancer or oral contraceptive use.  Patient is a 46 y.o. female presenting with shortness of breath. The history is provided by the patient.  Shortness of Breath  Associated symptoms include shortness of breath.    Past Medical History  Diagnosis Date  . Sleep apnea     noncompliant w/ CPAP  . Hypertension   . Hyperlipidemia   . Chronic headache   . Fibromyalgia     daily narcotics  . Anxiety     hx chronic BZ use, stopped 07/2010  . Anemia   . Pulmonary sarcoidosis     unimpressive CT chest 2011  . Colonic polyp   . GERD (gastroesophageal reflux disease)   . ALLERGIC RHINITIS   . Asthma   . CHF (congestive heart failure)     Diastolic with fluid overload, May, 2012, LVEF 60%  . Morbid obesity   . Depression   . Panic attacks   . Diabetes mellitus   . COPD (chronic obstructive pulmonary disease)     on home O2, moderate airflow  obstruction, suspect d/t emphysema  . Obesity   . Elevated LFTs 09/2011  . Ovarian cyst     Past Surgical History  Procedure Date  . Polypectomy 2011  . Lumbar microdiscectomy 07/06/2011    R L4-5, stern  . Back surgery   . Carpal tunnel release     Family History  Problem Relation Age of Onset  . Hypertension Mother   . Emphysema Father   . Hypertension Father   . Stomach cancer Father   . Allergies Brother   . Hypertension Brother   . Stomach cancer Brother   . Heart disease Mother   . Heart disease Father   . Heart disease Brother     History  Substance Use Topics  . Smoking status: Current Every Day Smoker -- 1.0 packs/day for 29 years    Types: Cigarettes    Last Attempt to Quit: 09/14/2010  . Smokeless tobacco: Never Used     Comment: Resumed smoking September 2013  . Alcohol Use: Yes     Comment: occasionally    OB History    Grav Para Term Preterm Abortions TAB SAB Ect Mult Living                  Review of Systems  Respiratory: Positive for shortness of breath.   All other systems reviewed and are negative.    Allergies  Ciprofloxacin; Latex; Sulfamethoxazole w-trimethoprim; Azithromycin;  Doxycycline; Metformin and related; and Metronidazole  Home Medications   Current Outpatient Rx  Name  Route  Sig  Dispense  Refill  . ALBUTEROL SULFATE HFA 108 (90 BASE) MCG/ACT IN AERS   Inhalation   Inhale 2 puffs into the lungs every 6 (six) hours as needed. For wheeze or shortness of breath         . ALBUTEROL SULFATE (2.5 MG/3ML) 0.083% IN NEBU   Nebulization   Take 2.5 mg by nebulization every 6 (six) hours as needed. For wheeze or shortness of breath         . ALPRAZOLAM 1 MG PO TABS   Oral   Take 1 tablet (1 mg total) by mouth 2 (two) times daily as needed. anxiety   60 tablet   3   . ASPIRIN 81 MG PO TBEC   Oral   Take 81 mg by mouth daily.         . BUDESONIDE-FORMOTEROL FUMARATE 160-4.5 MCG/ACT IN AERO   Inhalation   Inhale 2  puffs into the lungs 2 (two) times daily.         Marland Kitchen CETIRIZINE HCL 10 MG PO TABS   Oral   Take 10 mg by mouth at bedtime.         Marland Kitchen ESOMEPRAZOLE MAGNESIUM 40 MG PO CPDR   Oral   Take 40 mg by mouth 2 (two) times daily.         Marland Kitchen GLIMEPIRIDE 1 MG PO TABS   Oral   Take 1 mg by mouth daily before breakfast.         . INSULIN GLARGINE 100 UNIT/ML [place] SOLN   Subcutaneous   Inject 8-16 Units into the skin at bedtime. If blood sugar is over 100, patient uses 16 units. If under 100, patient uses 8 units         . MOMETASONE FUROATE 50 MCG/ACT NA SUSP   Nasal   Place 2 sprays into the nose daily.         Marland Kitchen NICOTINE 21 MG/24HR TD PT24   Transdermal   Place 1 patch onto the skin daily.         . OXYCODONE HCL 10 MG PO TABS   Oral   Take 0.5-1 tablets (5-10 mg total) by mouth 3 (three) times daily as needed. Do not fill before 26th of month   90 tablet   0   . POTASSIUM CHLORIDE CRYS ER 20 MEQ PO TBCR   Oral   Take 20 mEq by mouth 2 (two) times daily.          . SERTRALINE HCL 25 MG PO TABS   Oral   Take 25 mg by mouth every morning.          Marland Kitchen SPIRONOLACTONE 25 MG PO TABS   Oral   Take 25 mg by mouth every morning. Take 1 by mouth daily         . TIOTROPIUM BROMIDE MONOHYDRATE 18 MCG IN CAPS   Inhalation   Place 18 mcg into inhaler and inhale daily.         . TORSEMIDE 100 MG PO TABS   Oral   Take 100 mg by mouth 2 (two) times daily.           BP 122/69  Pulse 78  Temp 98 F (36.7 C) (Oral)  Resp 24  SpO2 97%  LMP 12/28/2010  Physical Exam  Nursing note and vitals reviewed. Constitutional: She  appears well-developed and well-nourished. No distress.  HENT:  Head: Normocephalic and atraumatic.  Mouth/Throat: Oropharynx is clear and moist. No oropharyngeal exudate.  Eyes: Conjunctivae normal and EOM are normal. Pupils are equal, round, and reactive to light. Right eye exhibits no discharge. Left eye exhibits no discharge. No scleral  icterus.  Neck: Normal range of motion. Neck supple. No JVD present. No thyromegaly present.  Cardiovascular: Normal rate, regular rhythm, normal heart sounds and intact distal pulses.  Exam reveals no gallop and no friction rub.   No murmur heard. Pulmonary/Chest: She is in respiratory distress. She has wheezes. She has no rales.       Increased work of breathing, mild tachypnea, decreased lung sounds bilaterally, prolonged expiratory phase, wheezing on end expiration  Abdominal: Soft. Bowel sounds are normal. She exhibits no distension and no mass. There is no tenderness.  Musculoskeletal: Normal range of motion. She exhibits tenderness ( There is a focal clinical effusion of the right knee joint, no redness, no tenderness, no warmth). She exhibits no edema.       Decreased range of motion secondary to effusion of the right knee  Lymphadenopathy:    She has no cervical adenopathy.  Neurological: She is alert. Coordination normal.  Skin: Skin is warm and dry. No rash noted. No erythema.  Psychiatric: She has a normal mood and affect. Her behavior is normal.    ED Course  Procedures (including critical care time)  Labs Reviewed  CBC WITH DIFFERENTIAL - Abnormal; Notable for the following:    RBC 5.33 (*)     MCH 25.9 (*)     RDW 16.3 (*)     All other components within normal limits  COMPREHENSIVE METABOLIC PANEL - Abnormal; Notable for the following:    Potassium 3.4 (*)     Glucose, Bld 106 (*)     Albumin 3.3 (*)     Alkaline Phosphatase 130 (*)     GFR calc non Af Amer 60 (*)     GFR calc Af Amer 70 (*)     All other components within normal limits  TROPONIN I   Dg Chest 2 View  09/01/2012  *RADIOLOGY REPORT*  Clinical Data: Shortness of breath.  History of COPD.  CHEST - 2 VIEW  Comparison: 07/20/2012.  Findings: The cardiac silhouette remains borderline enlarged. Stable prominence of the interstitial markings and stable linear densities in both lower lung zones.   Unremarkable bones.  IMPRESSION:  1.  No acute abnormality. 2.  Stable chronic interstitial lung disease and bilateral scarring.   Original Report Authenticated By: Beckie Salts, M.D.    Ct Angio Chest Pe W/cm &/or Wo Cm  09/01/2012  *RADIOLOGY REPORT*  Clinical Data: Shortness of breath.  CT ANGIOGRAPHY CHEST  Technique:  Multidetector CT imaging of the chest using the standard protocol during bolus administration of intravenous contrast. Multiplanar reconstructed images including MIPs were obtained and reviewed to evaluate the vascular anatomy.  Contrast: 80mL OMNIPAQUE IOHEXOL 350 MG/ML SOLN  Comparison: 11/18/2010  Findings: No filling defects in the pulmonary arteries to suggest pulmonary emboli.  Heart is heart is borderline in size. Previously seen mediastinal adenopathy has improved.  No enlarged mediastinal, hilar or axillary lymph nodes currently. Emphysematous changes within the lungs.  Scarring in the bases.  No acute opacities or effusions.  Imaging into the upper abdomen shows no acute findings.  No acute bony abnormality.  IMPRESSION: No evidence of pulmonary embolus.  Emphysematous changes.  Scarring in the  bases.   Original Report Authenticated By: Charlett Nose, M.D.      1. Respiratory distress   2. COPD exacerbation   3.  Right knee Effusion    MDM  The patient has not had any asymmetry of the legs or lower extremities edema however she is morbidly obese and does have shortness of breath which is consistent with her wheezing. I will obtain a chest x-ray, IV laboratory tests, continuous nebulizer and prednisone, the patient has requested that during her effusion by arthrocentesis, she has given verbal consent and will give written consent as well.  Procedure Note: Arthrocentesis  Risks benefits and alternatives to the procedure explained to the patient Verbal and written consent obtained Site marked, time out called  Patient placed in the supine position, right knee joint  exposed  Patient cleaned and draped in sterile fashion  Local anesthetic - 1% lidocaine with epinephrine, 4 mL  Procedural sedation - none  Materials used - 25-gauge needle for anesthetic injection, 18-gauge needle for aspiration  Fluid removed - 50 cc of yellow synovial fluid, no purulence, no blood  Labs ordered - deferred, symptomatic arthrocentesis  Patient tolerated the procedure without any difficulty, minimal pain, puncture site cleaned and dressed with sterile dressing.   During the course of the patient's emergency department stay she received 2 continuous nebulizers, prednisone, CT scan of the chest to rule out pulmonary embolism. According to my interpretation and I agree with the radiologist interpretation there is no signs of pulmonary embolism on the CT scan. She does have evidence of severe COPD. After the second continuous neb, she still dropped her oxygen to less than 90%. She still feels short of breath, she has severe COPD.  Critical care was delivered   CRITICAL CARE Performed by: Vida Roller   Total critical care time: 35  Critical care time was exclusive of separately billable procedures and treating other patients.  Critical care was necessary to treat or prevent imminent or life-threatening deterioration.  Critical care was time spent personally by me on the following activities: development of treatment plan with patient and/or surrogate as well as nursing, discussions with consultants, evaluation of patient's response to treatment, examination of patient, obtaining history from patient or surrogate, ordering and performing treatments and interventions, ordering and review of laboratory studies, ordering and review of radiographic studies, pulse oximetry and re-evaluation of patient's condition.   I discussed the patient's care with the [facility] hospitalist Dr. Joneen Roach who will admit.   Vida Roller, MD 09/01/12 [phone]

## 2012-09-02 ENCOUNTER — Other Ambulatory Visit: Payer: Self-pay | Admitting: Internal Medicine

## 2012-09-02 DIAGNOSIS — R0609 Other forms of dyspnea: Secondary | ICD-10-CM

## 2012-09-02 LAB — CBC
MCHC: 32.9 g/dL (ref 30.0–36.0)
Platelets: 237 10*3/uL (ref 150–400)
RDW: 16.3 % — ABNORMAL HIGH (ref 11.5–15.5)
WBC: 15.9 10*3/uL — ABNORMAL HIGH (ref 4.0–10.5)

## 2012-09-02 LAB — BASIC METABOLIC PANEL
BUN: 15 mg/dL (ref 6–23)
Calcium: 9.3 mg/dL (ref 8.4–10.5)
Creatinine, Ser: 0.78 mg/dL (ref 0.50–1.10)
GFR calc Af Amer: >90 mL/min (ref >90)

## 2012-09-02 LAB — EXPECTORATED SPUTUM ASSESSMENT W GRAM STAIN, RFLX TO RESP C

## 2012-09-02 LAB — MRSA PCR SCREENING: MRSA by PCR: POSITIVE — AB

## 2012-09-02 LAB — GLUCOSE, CAPILLARY: Glucose-Capillary: 183 mg/dL — ABNORMAL HIGH (ref 70–99)

## 2012-09-02 LAB — HEMOGLOBIN A1C: Mean Plasma Glucose: 123 mg/dL — ABNORMAL HIGH (ref <117)

## 2012-09-02 MED ORDER — MUPIROCIN 2 % EX OINT
1.0000 "application " | TOPICAL_OINTMENT | Freq: Two times a day (BID) | CUTANEOUS | Status: DC
  Administered 2012-09-02 – 2012-09-05 (×6): 1 via NASAL
  Filled 2012-09-02 (×2): qty 22

## 2012-09-02 MED ORDER — CHLORHEXIDINE GLUCONATE CLOTH 2 % EX PADS
6.0000 | MEDICATED_PAD | Freq: Every day | CUTANEOUS | Status: DC
  Administered 2012-09-03: 6 via TOPICAL

## 2012-09-02 MED ORDER — WHITE PETROLATUM GEL
Status: AC
  Administered 2012-09-02: 11:00:00
  Filled 2012-09-02: qty 5

## 2012-09-02 NOTE — Progress Notes (Signed)
Pt says she will go on cpap when she is ready and can put the mask on herself. RT advised pt to call if she needs help. RT will continue to monitor.

## 2012-09-02 NOTE — Progress Notes (Signed)
[facility] PROGRESS NOTE  [name] ZOX:[id number] DOB: [birth year] DOA: 09/01/2012 PCP: Rene Paci, MD  Assessment/Plan: Principal Problem:  *COPD with acute exacerbation Active Problems:  PULMONARY SARCOIDOSIS  Morbid obesity  HYPERTENSION  GERD  Tobacco abuse  DM type 2 (diabetes mellitus, type 2)  Knee pain, right    COPD with Acute Exacerbation  -Nebs, IV steroids.  -Will withhold antibiotics given she is afebrile, no leukocytosis and has multiple antibiotic allergies.  -Continue symbicort.  -Restart Spiriva at DC.  Leukocytosis secondary to IV Solu-Medrol Tobacco Abuse  -Counseled.  -Nicotine patch.  GERD  -Continue PPI.  DM II  -Continue lantus and SSI.  HTN  -Continue home meds.  Right Knee Swelling/Pain  -S/p arthrocentesis in the ED.  -Main issue was edema and not pain.  -Suspect related to OA from strain from obesity.  DVT Prophylaxis  -Lovenox.  Code Status  -Full code.   Code Status: full Family Communication: family updated about patient's clinical progress Disposition Plan:  Anticipate discharge tomorrow  Brief narrative: Pleasant 46 y/o woman with known history of COPD and pulmonary sarcoidosis who began smoking again 1 month ago. She began having a dry cough with SOB the day prior to admission. She also developed swelling of her right knee. Came to the hospital for evaluation. She was found to have diffuse wheezing on exam that did not abate after nebs given in the ED. Her right knee was tapped. We have been asked to admit her for further evaluation and management.   Consultants:  None  Procedures:  None  Antibiotics:  None  HPI/Subjective: Hemodynamically stable overnight  Objective: Filed Vitals:   09/01/12 2109 09/01/12 2142 09/01/12 2152 09/02/12 0615  BP:  111/51  114/68  Pulse:  80 84 74  Temp:  97.4 F (36.3 C)  98.1 F (36.7 C)  TempSrc:      Resp:  18 18 18   Height:   5\' 8"  (1.727 m)   Weight:    127.914 kg (282 lb)   SpO2: 98% 99%  100%    Intake/Output Summary (Last 24 hours) at 09/02/12 0933 Last data filed at 09/02/12 0700  Gross per 24 hour  Intake   1280 ml  Output   2500 ml  Net  -1220 ml    Exam:  Gen: AA Ox3, in no current distress, morbidly obese.  HEENT: [place]/AT/PERRL/EOMI  Neck: supple, no JVD, no LAD, no bruits, no goiter.  CV: RRR, no M/R/G  Lungs: mild bilateral expiratory wheezes.  Abd: obese, soft, nontender, nondistended, +BS, no masses or organomegaly noted.  Ext: trace bilateral pitting edema. Right knee wrapped in ACE bandage.  Neuro: grossly intact and non-focal. I have not ambulated her.       Data Reviewed: Basic Metabolic Panel:  Lab 09/02/12 8119 09/01/12 1105 09/01/12 0030  NA 134* -- 137  K 4.3 -- 3.4*  CL 95* -- 99  CO2 29 -- 28  GLUCOSE 195* -- 106*  BUN 15 -- 15  CREATININE 0.78 0.90 1.09  CALCIUM 9.3 -- 9.1  MG -- -- --  PHOS -- -- --    Liver Function Tests:  Lab 09/01/12 0030  AST 22  ALT 23  ALKPHOS 130*  BILITOT 0.3  PROT 7.3  ALBUMIN 3.3*   No results found for this basename: LIPASE:5,AMYLASE:5 in the last 168 hours No results found for this basename: AMMONIA:5 in the last 168 hours  CBC:  Lab 09/02/12 0615 09/01/12 1105 09/01/12 0030  WBC 15.9* 10.9* 10.0  NEUTROABS -- -- 7.3  HGB 13.7 13.2 13.8  HCT 41.6 40.7 42.1  MCV 79.1 80.0 79.0  PLT 237 197 207    Cardiac Enzymes:  Lab 09/01/12 0030  CKTOTAL --  CKMB --  CKMBINDEX --  TROPONINI <0.30   BNP (last 3 results)  Basename 04/22/12 0910 12/22/11 1414 12/14/11 0654  PROBNP 35.5 30.2 87.0     CBG:  Lab 09/02/12 0701 09/01/12 2144 09/01/12 1625 09/01/12 1140  GLUCAP 183* 157* 111* 177*    No results found for this or any previous visit (from the past 240 hour(s)).   Studies: Dg Chest 2 View  09/01/2012  *RADIOLOGY REPORT*  Clinical Data: Shortness of breath.  History of COPD.  CHEST - 2 VIEW  Comparison: 07/20/2012.  Findings: The  cardiac silhouette remains borderline enlarged. Stable prominence of the interstitial markings and stable linear densities in both lower lung zones.  Unremarkable bones.  IMPRESSION:  1.  No acute abnormality. 2.  Stable chronic interstitial lung disease and bilateral scarring.   Original Report Authenticated By: Beckie Salts, M.D.    Ct Angio Chest Pe W/cm &/or Wo Cm  09/01/2012  *RADIOLOGY REPORT*  Clinical Data: Shortness of breath.  CT ANGIOGRAPHY CHEST  Technique:  Multidetector CT imaging of the chest using the standard protocol during bolus administration of intravenous contrast. Multiplanar reconstructed images including MIPs were obtained and reviewed to evaluate the vascular anatomy.  Contrast: 80mL OMNIPAQUE IOHEXOL 350 MG/ML SOLN  Comparison: 11/18/2010  Findings: No filling defects in the pulmonary arteries to suggest pulmonary emboli.  Heart is heart is borderline in size. Previously seen mediastinal adenopathy has improved.  No enlarged mediastinal, hilar or axillary lymph nodes currently. Emphysematous changes within the lungs.  Scarring in the bases.  No acute opacities or effusions.  Imaging into the upper abdomen shows no acute findings.  No acute bony abnormality.  IMPRESSION: No evidence of pulmonary embolus.  Emphysematous changes.  Scarring in the bases.   Original Report Authenticated By: Charlett Nose, M.D.     Scheduled Meds:   . albuterol  2.5 mg Nebulization Q6H WA  . aspirin EC  81 mg Oral Daily  . budesonide-formoterol  2 puff Inhalation BID  . enoxaparin (LOVENOX) injection  40 mg Subcutaneous Q24H  . fluticasone  2 spray Each Nare Daily  . glimepiride  1 mg Oral QAC breakfast  . insulin aspart  0-20 Units Subcutaneous TID WC  . insulin aspart  6 Units Subcutaneous TID WC  . insulin glargine  16 Units Subcutaneous QHS  . ipratropium  0.5 mg Nebulization Q6H WA  . loratadine  10 mg Oral Daily  . methylPREDNISolone (SOLU-MEDROL) injection  80 mg Intravenous Q8H  .  nicotine  21 mg Transdermal Daily  . pantoprazole  40 mg Oral Daily  . pneumococcal 23 valent vaccine  0.5 mL Intramuscular Tomorrow-1000  . potassium chloride SA  20 mEq Oral BID  . sertraline  25 mg Oral q morning - 10a  . sodium chloride  3 mL Intravenous Q12H  . spironolactone  25 mg Oral q morning - 10a  . torsemide  100 mg Oral BID  . [DISCONTINUED] aspirin  81 mg Oral Daily   Continuous Infusions:   . [DISCONTINUED] albuterol Stopped (09/01/12 0220)  . [DISCONTINUED] albuterol Stopped (09/01/12 0730)    Principal Problem:  *COPD with acute exacerbation Active Problems:  PULMONARY SARCOIDOSIS  Morbid obesity  HYPERTENSION  GERD  Tobacco abuse  DM type 2 (diabetes mellitus, type 2)  Knee pain, right    Time spent: 40 minutes   [facility]  [facility] Pager [phone]. If 8PM-8AM, please contact night-coverage at [email or web address], password [facility] 09/02/2012, 9:33 AM  LOS: 1 day

## 2012-09-03 DIAGNOSIS — I517 Cardiomegaly: Secondary | ICD-10-CM

## 2012-09-03 LAB — CK TOTAL AND CKMB (NOT AT ARMC)
CK, MB: 1.7 ng/mL (ref 0.3–4.0)
Relative Index: INVALID (ref 0.0–2.5)
Total CK: 43 U/L (ref 7–177)

## 2012-09-03 LAB — GLUCOSE, CAPILLARY
Glucose-Capillary: 219 mg/dL — ABNORMAL HIGH (ref 70–99)
Glucose-Capillary: 177 mg/dL — ABNORMAL HIGH (ref 70–99)

## 2012-09-03 LAB — PRO B NATRIURETIC PEPTIDE: Pro B Natriuretic peptide (BNP): 68.5 pg/mL (ref 0–125)

## 2012-09-03 MED ORDER — OXYCODONE HCL 5 MG PO TABS
10.0000 mg | ORAL_TABLET | ORAL | Status: DC | PRN
  Administered 2012-09-03: 5 mg via ORAL
  Administered 2012-09-03 – 2012-09-05 (×6): 10 mg via ORAL
  Filled 2012-09-03: qty 1
  Filled 2012-09-03 (×6): qty 2

## 2012-09-03 MED ORDER — GUAIFENESIN-CODEINE 100-10 MG/5ML PO SOLN
10.0000 mL | Freq: Four times a day (QID) | ORAL | Status: DC | PRN
  Administered 2012-09-03 – 2012-09-05 (×4): 10 mL via ORAL
  Filled 2012-09-03: qty 10
  Filled 2012-09-03 (×4): qty 5
  Filled 2012-09-03: qty 10

## 2012-09-03 MED ORDER — AMOXICILLIN-POT CLAVULANATE 875-125 MG PO TABS
1.0000 | ORAL_TABLET | Freq: Two times a day (BID) | ORAL | Status: DC
  Administered 2012-09-03 – 2012-09-05 (×5): 1 via ORAL
  Filled 2012-09-03 (×6): qty 1

## 2012-09-03 MED ORDER — PREDNISONE 20 MG PO TABS
40.0000 mg | ORAL_TABLET | Freq: Two times a day (BID) | ORAL | Status: DC
  Administered 2012-09-03 – 2012-09-05 (×5): 40 mg via ORAL
  Filled 2012-09-03 (×7): qty 2

## 2012-09-03 NOTE — Progress Notes (Signed)
Pt called out c/o "nagging" chest pain w/o pressure as well as a general feeling of "not feeling good".  VS were WNL and EKG was done, results were NSR with HR of 86.  Pt appeared anxious and after VS and EKG were done, she reported less pain and anxiety.  Pt also complained of non-productive cough and requested neb treatment.  Respiratory was notified.  Will continue to monitor closely.

## 2012-09-03 NOTE — Progress Notes (Signed)
[facility] PROGRESS NOTE  Ming Kunka UUV:[id number] DOB: [birth year] DOA: 09/01/2012 PCP: Rene Paci, MD  Assessment/Plan: Principal Problem:  *COPD with acute exacerbation Active Problems:  PULMONARY SARCOIDOSIS  Morbid obesity  HYPERTENSION  GERD  Tobacco abuse  DM type 2 (diabetes mellitus, type 2)  Knee pain, right    COPD with Acute Exacerbation  -Nebs, IV steroids.  -Will withhold antibiotics given she is afebrile, no leukocytosis and has multiple antibiotic allergies.  -Continue symbicort.  -Restart Spiriva at DC.  Leukocytosis , switched to oral prednisone Check oxygen saturation with ambulation Empirically start, Augmentin allergic to everything else To evaluate her chest pain and repeat cardiac enzymes, check BNP, 2-D echo CTA was negative for bone embolism  Tobacco Abuse  -Counseled.  -Nicotine patch.  GERD  -Continue PPI.  DM II  -Continue lantus and SSI.  HTN  -Continue home meds.  Right Knee Swelling/Pain  -S/p arthrocentesis in the ED.  -Main issue was edema and not pain.  -Suspect related to OA from strain from obesity.  DVT Prophylaxis  -Lovenox.  Code Status  -Full code.    Code Status: full  Family Communication: family updated about patient's clinical progress  Disposition Plan: Anticipate discharge tomorrow    Brief narrative:  Pleasant 46 y/o woman with known history of COPD and pulmonary sarcoidosis who began smoking again 1 month ago. She began having a dry cough with SOB the day prior to admission. She also developed swelling of her right knee. Came to the hospital for evaluation. She was found to have diffuse wheezing on exam that did not abate after nebs given in the ED. Her right knee was tapped. We have been asked to admit her for further evaluation and management.  Consultants:  None Procedures:  None Antibiotics:  None HPI/Subjective:  Complaining of chest pain this morning EKG shows normal sinus  rhythm Nonproductive cough   Objective: Filed Vitals:   09/02/12 2212 09/03/12 0139 09/03/12 0258 09/03/12 0600  BP:  94/41  134/80  Pulse:  90  83  Temp:  98 F (36.7 C)  97.5 F (36.4 C)  TempSrc:  Oral  Axillary  Resp:  20    Height:      Weight:      SpO2: 96% 94% 94% 97%    Intake/Output Summary (Last 24 hours) at 09/03/12 0903 Last data filed at 09/02/12 1700  Gross per 24 hour  Intake    480 ml  Output      0 ml  Net    480 ml    Exam:  HENT:  Head: Atraumatic.  Nose: Nose normal.  Mouth/Throat: Oropharynx is clear and moist.  Eyes: Conjunctivae are normal. Pupils are equal, round, and reactive to light. No scleral icterus.  Neck: Neck supple. No tracheal deviation present.  Cardiovascular: Normal rate, regular rhythm, normal heart sounds and intact distal pulses.  Pulmonary/Chest: Effort normal and breath sounds normal. No respiratory distress.  Abdominal: Soft. Normal appearance and bowel sounds are normal. She exhibits no distension. There is no tenderness.  Musculoskeletal: She exhibits no edema and no tenderness.  Neurological: She is alert. No cranial nerve deficit.    Data Reviewed: Basic Metabolic Panel:  Lab 09/02/12 4742 09/01/12 1105 09/01/12 0030  NA 134* -- 137  K 4.3 -- 3.4*  CL 95* -- 99  CO2 29 -- 28  GLUCOSE 195* -- 106*  BUN 15 -- 15  CREATININE 0.78 0.90 1.09  CALCIUM 9.3 -- 9.1  MG -- -- --  PHOS -- -- --    Liver Function Tests:  Lab 09/01/12 0030  AST 22  ALT 23  ALKPHOS 130*  BILITOT 0.3  PROT 7.3  ALBUMIN 3.3*   No results found for this basename: LIPASE:5,AMYLASE:5 in the last 168 hours No results found for this basename: AMMONIA:5 in the last 168 hours  CBC:  Lab 09/02/12 0615 09/01/12 1105 09/01/12 0030  WBC 15.9* 10.9* 10.0  NEUTROABS -- -- 7.3  HGB 13.7 13.2 13.8  HCT 41.6 40.7 42.1  MCV 79.1 80.0 79.0  PLT 237 197 207    Cardiac Enzymes:  Lab 09/01/12 0030  CKTOTAL --  CKMB --  CKMBINDEX --   TROPONINI <0.30   BNP (last 3 results)  Basename 04/22/12 0910 12/22/11 1414 12/14/11 0654  PROBNP 35.5 30.2 87.0     CBG:  Lab 09/03/12 0642 09/02/12 2127 09/02/12 1628 09/02/12 1141 09/02/12 0701  GLUCAP 203* 220* 206* 145* 183*    Recent Results (from the past 240 hour(s))  CULTURE, EXPECTORATED SPUTUM-ASSESSMENT     Status: Normal   Collection Time   09/02/12 11:20 AM      Component Value Range Status Comment   Specimen Description SPUTUM   Final    Special Requests NONE   Final    Sputum evaluation     Final    Value: THIS SPECIMEN IS ACCEPTABLE. RESPIRATORY CULTURE REPORT TO FOLLOW.   Report Status 09/02/2012 FINAL   Final   MRSA PCR SCREENING     Status: Abnormal   Collection Time   09/02/12 11:22 AM      Component Value Range Status Comment   MRSA by PCR POSITIVE (*) NEGATIVE Final      Studies: Dg Chest 2 View  09/01/2012  *RADIOLOGY REPORT*  Clinical Data: Shortness of breath.  History of COPD.  CHEST - 2 VIEW  Comparison: 07/20/2012.  Findings: The cardiac silhouette remains borderline enlarged. Stable prominence of the interstitial markings and stable linear densities in both lower lung zones.  Unremarkable bones.  IMPRESSION:  1.  No acute abnormality. 2.  Stable chronic interstitial lung disease and bilateral scarring.   Original Report Authenticated By: Beckie Salts, M.D.    Ct Angio Chest Pe W/cm &/or Wo Cm  09/01/2012  *RADIOLOGY REPORT*  Clinical Data: Shortness of breath.  CT ANGIOGRAPHY CHEST  Technique:  Multidetector CT imaging of the chest using the standard protocol during bolus administration of intravenous contrast. Multiplanar reconstructed images including MIPs were obtained and reviewed to evaluate the vascular anatomy.  Contrast: 80mL OMNIPAQUE IOHEXOL 350 MG/ML SOLN  Comparison: 11/18/2010  Findings: No filling defects in the pulmonary arteries to suggest pulmonary emboli.  Heart is heart is borderline in size. Previously seen mediastinal adenopathy  has improved.  No enlarged mediastinal, hilar or axillary lymph nodes currently. Emphysematous changes within the lungs.  Scarring in the bases.  No acute opacities or effusions.  Imaging into the upper abdomen shows no acute findings.  No acute bony abnormality.  IMPRESSION: No evidence of pulmonary embolus.  Emphysematous changes.  Scarring in the bases.   Original Report Authenticated By: Charlett Nose, M.D.     Scheduled Meds:   . albuterol  2.5 mg Nebulization Q6H WA  . aspirin EC  81 mg Oral Daily  . budesonide-formoterol  2 puff Inhalation BID  . Chlorhexidine Gluconate Cloth  6 each Topical Q0600  . enoxaparin (LOVENOX) injection  40 mg Subcutaneous Q24H  . fluticasone  2 spray Each Nare  Daily  . glimepiride  1 mg Oral QAC breakfast  . insulin aspart  0-20 Units Subcutaneous TID WC  . insulin aspart  6 Units Subcutaneous TID WC  . insulin glargine  16 Units Subcutaneous QHS  . ipratropium  0.5 mg Nebulization Q6H WA  . loratadine  10 mg Oral Daily  . mupirocin ointment  1 application Nasal BID  . nicotine  21 mg Transdermal Daily  . pantoprazole  40 mg Oral Daily  . [COMPLETED] pneumococcal 23 valent vaccine  0.5 mL Intramuscular Tomorrow-1000  . potassium chloride SA  20 mEq Oral BID  . predniSONE  40 mg Oral BID  . sertraline  25 mg Oral q morning - 10a  . sodium chloride  3 mL Intravenous Q12H  . spironolactone  25 mg Oral q morning - 10a  . torsemide  100 mg Oral BID  . [COMPLETED] white petrolatum      . [DISCONTINUED] methylPREDNISolone (SOLU-MEDROL) injection  80 mg Intravenous Q8H   Continuous Infusions:   Principal Problem:  *COPD with acute exacerbation Active Problems:  PULMONARY SARCOIDOSIS  Morbid obesity  HYPERTENSION  GERD  Tobacco abuse  DM type 2 (diabetes mellitus, type 2)  Knee pain, right    Time spent: 40 minutes   [facility]  [facility] Pager [phone]. If 8PM-8AM, please contact night-coverage at [email or web address], password  [facility] 09/03/2012, 9:03 AM  LOS: 2 days

## 2012-09-03 NOTE — Progress Notes (Signed)
Patient states she will place Bipap on herself tonight. Instructed to call if she needs assistance. RT will continue to monitor.

## 2012-09-03 NOTE — Progress Notes (Signed)
Utilization review completed. Amyrie Illingworth, RN, BSN. 

## 2012-09-03 NOTE — Progress Notes (Signed)
  Echocardiogram 2D Echocardiogram has been performed.  [name] 09/03/2012, 6:24 PM

## 2012-09-04 LAB — GLUCOSE, CAPILLARY
Glucose-Capillary: 170 mg/dL — ABNORMAL HIGH (ref 70–99)
Glucose-Capillary: 146 mg/dL — ABNORMAL HIGH (ref 70–99)

## 2012-09-04 MED ORDER — BUDESONIDE-FORMOTEROL FUMARATE 160-4.5 MCG/ACT IN AERO: 2.0000 | INHALATION_SPRAY | Freq: Two times a day (BID) | RESPIRATORY_TRACT | Status: DC

## 2012-09-04 MED ORDER — ALBUTEROL SULFATE HFA 108 (90 BASE) MCG/ACT IN AERS: 2.0000 | INHALATION_SPRAY | Freq: Four times a day (QID) | RESPIRATORY_TRACT | Status: DC | PRN

## 2012-09-04 MED ORDER — AMOXICILLIN-POT CLAVULANATE 875-125 MG PO TABS: 1.0000 | ORAL_TABLET | Freq: Two times a day (BID) | ORAL | Status: AC

## 2012-09-04 MED ORDER — ALBUTEROL SULFATE (2.5 MG/3ML) 0.083% IN NEBU: 2.5000 mg | INHALATION_SOLUTION | Freq: Four times a day (QID) | RESPIRATORY_TRACT | Status: DC | PRN

## 2012-09-04 MED ORDER — GUAIFENESIN-CODEINE 100-10 MG/5ML PO SOLN: 10.0000 mL | Freq: Four times a day (QID) | ORAL | Status: DC | PRN

## 2012-09-04 MED ORDER — PREDNISONE 50 MG PO TABS: ORAL_TABLET | ORAL | Status: AC

## 2012-09-04 NOTE — Discharge Summary (Signed)
Physician Discharge Summary  [name] MRN: [id number] DOB/AGE: 09-27-1965 46 y.o.  PCP: Rene Paci, MD   Admit date: 09/01/2012 Discharge date: 09/04/2012  Discharge Diagnoses:   :  *COPD with acute exacerbation Active Problems:  PULMONARY SARCOIDOSIS  Morbid obesity  HYPERTENSION  GERD  Tobacco abuse  DM type 2 (diabetes mellitus, type 2)  Knee pain, right     Medication List     As of 09/04/2012  9:46 AM    TAKE these medications         ALPRAZolam 1 MG tablet   Commonly known as: XANAX   Take 1 tablet (1 mg total) by mouth 2 (two) times daily as needed. anxiety      amoxicillin-clavulanate 875-125 MG per tablet   Commonly known as: AUGMENTIN   Take 1 tablet by mouth every 12 (twelve) hours.      aspirin 81 MG EC tablet   Take 81 mg by mouth daily.      budesonide-formoterol 160-4.5 MCG/ACT inhaler   Commonly known as: SYMBICORT   Inhale 2 puffs into the lungs 2 (two) times daily.      cetirizine 10 MG tablet   Commonly known as: ZYRTEC   Take 10 mg by mouth at bedtime.      glimepiride 1 MG tablet   Commonly known as: AMARYL   Take 1 mg by mouth daily before breakfast.      guaiFENesin-codeine 100-10 MG/5ML syrup   Take 10 mLs by mouth every 6 (six) hours as needed for cough.      insulin glargine 100 UNIT/ML injection   Commonly known as: LANTUS   Inject 8-16 Units into the skin at bedtime. If blood sugar is over 100, patient uses 16 units. If under 100, patient uses 8 units      mometasone 50 MCG/ACT nasal spray   Commonly known as: NASONEX   Place 2 sprays into the nose daily.      NEXIUM 40 MG capsule   Generic drug: esomeprazole   TAKE ONE CAPSULE BY MOUTH DAILY      esomeprazole 40 MG capsule   Commonly known as: NEXIUM   Take 40 mg by mouth 2 (two) times daily.      nicotine 21 mg/24hr patch   Commonly known as: NICODERM CQ - dosed in mg/24 hours   Place 1 patch onto the skin daily.      Oxycodone HCl 10 MG Tabs    Take 0.5-1 tablets (5-10 mg total) by mouth 3 (three) times daily as needed. Do not fill before 26th of month      potassium chloride SA 20 MEQ tablet   Commonly known as: K-DUR,KLOR-CON   Take 20 mEq by mouth 2 (two) times daily.      predniSONE 50 MG tablet   Commonly known as: DELTASONE   For 7 days      sertraline 25 MG tablet   Commonly known as: ZOLOFT   Take 25 mg by mouth every morning.      SPIRIVA HANDIHALER 18 MCG inhalation capsule   Generic drug: tiotropium   INHALE CONTENTS OF ONE CAPSULE ONCE DAILY USING HANDIHALER      tiotropium 18 MCG inhalation capsule   Commonly known as: SPIRIVA   Place 18 mcg into inhaler and inhale daily.      spironolactone 25 MG tablet   Commonly known as: ALDACTONE   Take 25 mg by mouth every morning. Take 1 by mouth  daily      torsemide 100 MG tablet   Commonly known as: DEMADEX   Take 100 mg by mouth 2 (two) times daily.      albuterol 108 (90 BASE) MCG/ACT inhaler   Commonly known as: PROVENTIL HFA;VENTOLIN HFA   Inhale 2 puffs into the lungs every 6 (six) hours as needed. For wheeze or shortness of breath      albuterol (2.5 MG/3ML) 0.083% nebulizer solution   Commonly known as: PROVENTIL   Take 2.5 mg by nebulization every 6 (six) hours as needed. For wheeze or shortness of breath      VENTOLIN HFA 108 (90 BASE) MCG/ACT inhaler   Generic drug: albuterol   INHALE 2 PUFFS BY MOUTH INTO THE LUNGS EVERY 6 HOURS AS NEEDED        Discharge Condition: Stable   Disposition: 01-Home or Self Care   Consults: Stable   Significant Diagnostic Studies: Dg Chest 2 View  09/01/2012  *RADIOLOGY REPORT*  Clinical Data: Shortness of breath.  History of COPD.  CHEST - 2 VIEW  Comparison: 07/20/2012.  Findings: The cardiac silhouette remains borderline enlarged. Stable prominence of the interstitial markings and stable linear densities in both lower lung zones.  Unremarkable bones.  IMPRESSION:  1.  No acute abnormality. 2.  Stable  chronic interstitial lung disease and bilateral scarring.   Original Report Authenticated By: Beckie Salts, M.D.    Ct Angio Chest Pe W/cm &/or Wo Cm  09/01/2012  *RADIOLOGY REPORT*  Clinical Data: Shortness of breath.  CT ANGIOGRAPHY CHEST  Technique:  Multidetector CT imaging of the chest using the standard protocol during bolus administration of intravenous contrast. Multiplanar reconstructed images including MIPs were obtained and reviewed to evaluate the vascular anatomy.  Contrast: 80mL OMNIPAQUE IOHEXOL 350 MG/ML SOLN  Comparison: 11/18/2010  Findings: No filling defects in the pulmonary arteries to suggest pulmonary emboli.  Heart is heart is borderline in size. Previously seen mediastinal adenopathy has improved.  No enlarged mediastinal, hilar or axillary lymph nodes currently. Emphysematous changes within the lungs.  Scarring in the bases.  No acute opacities or effusions.  Imaging into the upper abdomen shows no acute findings.  No acute bony abnormality.  IMPRESSION: No evidence of pulmonary embolus.  Emphysematous changes.  Scarring in the bases.   Original Report Authenticated By: Charlett Nose, M.D.      2-D echo  LV EF: 60% - 65%  ------------------------------------------------------------ Indications: Chest pain 786.51.  ------------------------------------------------------------ History: PMH: Chronic obstructive pulmonary disease. Risk factors: Current tobacco use. Hypertension. Diabetes mellitus. Morbidly obese. Dyslipidemia.  ------------------------------------------------------------ Study Conclusions  - Left ventricle: The cavity size was normal. Wall thickness was normal. Systolic function was normal. The estimated ejection fraction was in the range of 60% to 65%. - Left atrium: The atrium was mildly dilated. Transthoracic echocardiography. M-mode, complete 2D  Microbiology: Recent Results (from the past 240 hour(s))  CULTURE, EXPECTORATED SPUTUM-ASSESSMENT      Status: Normal   Collection Time   09/02/12 11:20 AM      Component Value Range Status Comment   Specimen Description SPUTUM   Final    Special Requests NONE   Final    Sputum evaluation     Final    Value: THIS SPECIMEN IS ACCEPTABLE. RESPIRATORY CULTURE REPORT TO FOLLOW.   Report Status 09/02/2012 FINAL   Final   CULTURE, RESPIRATORY     Status: Normal (Preliminary result)   Collection Time   09/02/12 11:20 AM  Component Value Range Status Comment   Specimen Description SPUTUM   Final    Special Requests NONE   Final    Gram Stain     Final    Value: NO WBC SEEN     RARE SQUAMOUS EPITHELIAL CELLS PRESENT     FEW GRAM POSITIVE COCCI IN PAIRS     IN CHAINS RARE GRAM POSITIVE RODS   Culture     Final    Value: MODERATE STAPHYLOCOCCUS AUREUS     Note: RIFAMPIN AND GENTAMICIN SHOULD NOT BE USED AS SINGLE DRUGS FOR TREATMENT OF STAPH INFECTIONS.   Report Status PENDING   Incomplete   MRSA PCR SCREENING     Status: Abnormal   Collection Time   09/02/12 11:22 AM      Component Value Range Status Comment   MRSA by PCR POSITIVE (*) NEGATIVE Final      Labs: Results for orders placed during the hospital encounter of 09/01/12 (from the past 48 hour(s))  CULTURE, EXPECTORATED SPUTUM-ASSESSMENT     Status: Normal   Collection Time   09/02/12 11:20 AM      Component Value Range Comment   Specimen Description SPUTUM      Special Requests NONE      Sputum evaluation        Value: THIS SPECIMEN IS ACCEPTABLE. RESPIRATORY CULTURE REPORT TO FOLLOW.   Report Status 09/02/2012 FINAL     CULTURE, RESPIRATORY     Status: Normal (Preliminary result)   Collection Time   09/02/12 11:20 AM      Component Value Range Comment   Specimen Description SPUTUM      Special Requests NONE      Gram Stain        Value: NO WBC SEEN     RARE SQUAMOUS EPITHELIAL CELLS PRESENT     FEW GRAM POSITIVE COCCI IN PAIRS     IN CHAINS RARE GRAM POSITIVE RODS   Culture        Value: MODERATE STAPHYLOCOCCUS  AUREUS     Note: RIFAMPIN AND GENTAMICIN SHOULD NOT BE USED AS SINGLE DRUGS FOR TREATMENT OF STAPH INFECTIONS.   Report Status PENDING     MRSA PCR SCREENING     Status: Abnormal   Collection Time   09/02/12 11:22 AM      Component Value Range Comment   MRSA by PCR POSITIVE (*) NEGATIVE   GLUCOSE, CAPILLARY     Status: Abnormal   Collection Time   09/02/12 11:41 AM      Component Value Range Comment   Glucose-Capillary 145 (*) 70 - 99 mg/dL    Comment 1 Notify RN     GLUCOSE, CAPILLARY     Status: Abnormal   Collection Time   09/02/12  4:28 PM      Component Value Range Comment   Glucose-Capillary 206 (*) 70 - 99 mg/dL    Comment 1 Documented in Chart      Comment 2 Notify RN     GLUCOSE, CAPILLARY     Status: Abnormal   Collection Time   09/02/12  9:27 PM      Component Value Range Comment   Glucose-Capillary 220 (*) 70 - 99 mg/dL   GLUCOSE, CAPILLARY     Status: Abnormal   Collection Time   09/03/12  6:42 AM      Component Value Range Comment   Glucose-Capillary 203 (*) 70 - 99 mg/dL   GLUCOSE, CAPILLARY  Status: Abnormal   Collection Time   09/03/12  9:08 AM      Component Value Range Comment   Glucose-Capillary 219 (*) 70 - 99 mg/dL   TROPONIN I     Status: Normal   Collection Time   09/03/12  9:50 AM      Component Value Range Comment   Troponin I <0.30  <0.30 ng/mL   CK TOTAL AND CKMB     Status: Normal   Collection Time   09/03/12  9:50 AM      Component Value Range Comment   Total CK 43  7 - 177 U/L    CK, MB 1.7  0.3 - 4.0 ng/mL    Relative Index RELATIVE INDEX IS INVALID  0.0 - 2.5   PRO B NATRIURETIC PEPTIDE     Status: Normal   Collection Time   09/03/12  9:50 AM      Component Value Range Comment   Pro B Natriuretic peptide (BNP) 68.5  0 - 125 pg/mL   GLUCOSE, CAPILLARY     Status: Abnormal   Collection Time   09/03/12 11:31 AM      Component Value Range Comment   Glucose-Capillary 239 (*) 70 - 99 mg/dL   GLUCOSE, CAPILLARY     Status: Abnormal    Collection Time   09/03/12  4:36 PM      Component Value Range Comment   Glucose-Capillary 218 (*) 70 - 99 mg/dL    Comment 1 Notify RN     GLUCOSE, CAPILLARY     Status: Abnormal   Collection Time   09/03/12  9:26 PM      Component Value Range Comment   Glucose-Capillary 177 (*) 70 - 99 mg/dL   GLUCOSE, CAPILLARY     Status: Abnormal   Collection Time   09/04/12  6:52 AM      Component Value Range Comment   Glucose-Capillary 219 (*) 70 - 99 mg/dL      HPI :* 46 y/o woman with known history of COPD and pulmonary sarcoidosis who began smoking again 1 month ago. She began having a dry cough with SOB the day prior to admission. She also developed swelling of her right knee. Came to the hospital for evaluation. She was found to have diffuse wheezing on exam that did not abate after nebs given in the ED. Her right knee was tapped. We have been asked to admit her for further evaluation and management.    HOSPITAL COURSE:  #1 COPD exacerbation Chest x-ray was negative, CT angiography as was negative for pulmonary embolism The patient was treated with IV Solu-Medrol, Augmentin because of her multiple antibiotic allergies. Extension was ambulated prior to discharge to assess home oxygen requirement. She will continue with Augmentin for another 7 days and continue with prednisone by mouth for 7 days Given her shortness of breath the morbid obesity the patient had a 2-D echo with results as above Her echo looks normal,  Tobacco Abuse  -Counseled.  -Nicotine patch.  GERD  -Continue PPI.  DM II  Patient can resume her outpatient oral medications HTN  -Continue home meds.  Right Knee Swelling/Pain  -S/p arthrocentesis in the ED.  -Main issue was edema and not pain.  -Suspect related to OA from strain from obesity     Discharge Exam:  Blood pressure 125/59, pulse 76, temperature 97.9 F (36.6 C), temperature source Axillary, resp. rate 20, height 5\' 8"  (1.727 m), weight 127.914 kg  (282  lb), last menstrual period 12/28/2010, SpO2 94.00%. Gen: AA Ox3, in no current distress, morbidly obese.  HEENT: [place]/AT/PERRL/EOMI  Neck: supple, no JVD, no LAD, no bruits, no goiter.  CV: RRR, no M/R/G  Lungs: mild bilateral expiratory wheezes.  Abd: obese, soft, nontender, nondistended, +BS, no masses or organomegaly noted.  Ext: trace bilateral pitting edema. Right knee wrapped in ACE bandage.  Neuro: grossly intact and non-focal. I have not ambulated her         Discharge Orders    Future Appointments: Provider: Department: Dept Phone: Center:   09/05/2012 4:15 PM Luis Abed, MD [facility] Main Office [place]) [phone] [id number]   09/23/2012 2:30 PM [facility] [place]  IMAGING [phone] [facility]        Signed: Richarda Overlie 09/04/2012, 9:46 AM

## 2012-09-05 ENCOUNTER — Ambulatory Visit: Payer: Medicare Other | Admitting: Cardiology

## 2012-09-05 LAB — CULTURE, RESPIRATORY W GRAM STAIN

## 2012-09-05 LAB — GLUCOSE, CAPILLARY: Glucose-Capillary: 192 mg/dL — ABNORMAL HIGH (ref 70–99)

## 2012-09-05 MED ORDER — CLINDAMYCIN HCL 300 MG PO CAPS: 300.0000 mg | ORAL_CAPSULE | Freq: Four times a day (QID) | ORAL | Status: AC

## 2012-09-05 MED ORDER — LEVALBUTEROL HCL 1.25 MG/3ML IN NEBU: 1.2500 mg | INHALATION_SOLUTION | RESPIRATORY_TRACT | Status: DC | PRN

## 2012-09-05 NOTE — Progress Notes (Signed)
Pts O2 sat ranged from 85-89% to RA while walking in the hall.

## 2012-09-05 NOTE — Discharge Summary (Signed)
No change in discharge summary from 09/04/2012 Discharge was held because of patient complaining of chest tightness  [facility] PROGRESS NOTE  [name] FAO:[id number] DOB: 02/12/1966 DOA: 09/01/2012 PCP: Rene Paci, MD  Assessment/Plan: Principal Problem:  *COPD with acute exacerbation Active Problems:  PULMONARY SARCOIDOSIS  Morbid obesity  HYPERTENSION  GERD  Tobacco abuse  DM type 2 (diabetes mellitus, type 2)  Knee pain, right      HPI :*  46 y/o woman with known history of COPD and pulmonary sarcoidosis who began smoking again 1 month ago. She began having a dry cough with SOB the day prior to admission. She also developed swelling of her right knee. Came to the hospital for evaluation. She was found to have diffuse wheezing on exam that did not abate after nebs given in the ED. Her right knee was tapped. We have been asked to admit her for further evaluation and management.   HOSPITAL COURSE:  #1 COPD exacerbation  Chest x-ray was negative, CT angiography as was negative for pulmonary embolism  The patient was treated with IV Solu-Medrol, Augmentin because of her multiple antibiotic allergies. Extension was ambulated prior to discharge to assess home oxygen requirement. She will continue with Augmentin for another 7 days and continue with prednisone by mouth for 7 days  Given her shortness of breath the morbid obesity the patient had a 2-D echo with results as above  Her echo looks normal,  Particularly cycled again and found to be negative Hopefully discharged today May need continuous home oxygen Nicotine cessation counseling has been done  Tobacco Abuse  -Counseled.  -Nicotine patch.  GERD  -Continue PPI.  DM II  Patient can resume her outpatient oral medications  HTN  -Continue home meds.  Right Knee Swelling/Pain  -S/p arthrocentesis in the ED.  -Main issue was edema and not pain.  -Suspect related to OA from strain from  obesity   HPI/Subjective:    Objective: Filed Vitals:   09/04/12 0550 09/04/12 1402 09/04/12 2200 09/05/12 0552  BP: 125/59 117/57 123/48 124/62  Pulse: 76 79 76 66  Temp: 97.9 F (36.6 C) 97.6 F (36.4 C) 97.9 F (36.6 C) 97.2 F (36.2 C)  TempSrc:      Resp: 20 18 18 16   Height:      Weight:      SpO2: 94% 99% 91% 98%   No intake or output data in the 24 hours ending 09/05/12 0727  Exam:  HENT:  Head: Atraumatic.  Nose: Nose normal.  Mouth/Throat: Oropharynx is clear and moist.  Eyes: Conjunctivae are normal. Pupils are equal, round, and reactive to light. No scleral icterus.  Neck: Neck supple. No tracheal deviation present.  Cardiovascular: Normal rate, regular rhythm, normal heart sounds and intact distal pulses.  Pulmonary/Chest: Effort normal and breath sounds normal. No respiratory distress.  Abdominal: Soft. Normal appearance and bowel sounds are normal. She exhibits no distension. There is no tenderness.  Musculoskeletal: She exhibits no edema and no tenderness.  Neurological: She is alert. No cranial nerve deficit.    Data Reviewed: Basic Metabolic Panel:  Lab 09/02/12 6962 09/01/12 1105 09/01/12 0030  NA 134* -- 137  K 4.3 -- 3.4*  CL 95* -- 99  CO2 29 -- 28  GLUCOSE 195* -- 106*  BUN 15 -- 15  CREATININE 0.78 0.90 1.09  CALCIUM 9.3 -- 9.1  MG -- -- --  PHOS -- -- --    Liver Function Tests:  Lab 09/01/12 0030  AST 22  ALT 23  ALKPHOS 130*  BILITOT 0.3  PROT 7.3  ALBUMIN 3.3*   No results found for this basename: LIPASE:5,AMYLASE:5 in the last 168 hours No results found for this basename: AMMONIA:5 in the last 168 hours  CBC:  Lab 09/02/12 0615 09/01/12 1105 09/01/12 0030  WBC 15.9* 10.9* 10.0  NEUTROABS -- -- 7.3  HGB 13.7 13.2 13.8  HCT 41.6 40.7 42.1  MCV 79.1 80.0 79.0  PLT 237 197 207    Cardiac Enzymes:  Lab 09/03/12 0950 09/01/12 0030  CKTOTAL 43 --  CKMB 1.7 --  CKMBINDEX -- --  TROPONINI <0.30 <0.30   BNP  (last 3 results)  Basename 09/03/12 0950 04/22/12 0910 12/22/11 1414  PROBNP 68.5 35.5 30.2     CBG:  Lab 09/05/12 0653 09/04/12 2215 09/04/12 1630 09/04/12 1117 09/04/12 0652  GLUCAP 192* 146* 170* 115* 219*    Recent Results (from the past 240 hour(s))  CULTURE, EXPECTORATED SPUTUM-ASSESSMENT     Status: Normal   Collection Time   09/02/12 11:20 AM      Component Value Range Status Comment   Specimen Description SPUTUM   Final    Special Requests NONE   Final    Sputum evaluation     Final    Value: THIS SPECIMEN IS ACCEPTABLE. RESPIRATORY CULTURE REPORT TO FOLLOW.   Report Status 09/02/2012 FINAL   Final   CULTURE, RESPIRATORY     Status: Normal   Collection Time   09/02/12 11:20 AM      Component Value Range Status Comment   Specimen Description SPUTUM   Final    Special Requests NONE   Final    Gram Stain     Final    Value: NO WBC SEEN     RARE SQUAMOUS EPITHELIAL CELLS PRESENT     FEW GRAM POSITIVE COCCI IN PAIRS     IN CHAINS RARE GRAM POSITIVE RODS   Culture     Final    Value: MODERATE STAPHYLOCOCCUS AUREUS     Note: RIFAMPIN AND GENTAMICIN SHOULD NOT BE USED AS SINGLE DRUGS FOR TREATMENT OF STAPH INFECTIONS. This organism DOES NOT demonstrate inducible Clindamycin resistance in vitro.   Report Status 09/05/2012 FINAL   Final    Organism ID, Bacteria STAPHYLOCOCCUS AUREUS   Final   MRSA PCR SCREENING     Status: Abnormal   Collection Time   09/02/12 11:22 AM      Component Value Range Status Comment   MRSA by PCR POSITIVE (*) NEGATIVE Final      Studies: Dg Chest 2 View  09/01/2012  *RADIOLOGY REPORT*  Clinical Data: Shortness of breath.  History of COPD.  CHEST - 2 VIEW  Comparison: 07/20/2012.  Findings: The cardiac silhouette remains borderline enlarged. Stable prominence of the interstitial markings and stable linear densities in both lower lung zones.  Unremarkable bones.  IMPRESSION:  1.  No acute abnormality. 2.  Stable chronic interstitial lung disease  and bilateral scarring.   Original Report Authenticated By: Beckie Salts, M.D.    Ct Angio Chest Pe W/cm &/or Wo Cm  09/01/2012  *RADIOLOGY REPORT*  Clinical Data: Shortness of breath.  CT ANGIOGRAPHY CHEST  Technique:  Multidetector CT imaging of the chest using the standard protocol during bolus administration of intravenous contrast. Multiplanar reconstructed images including MIPs were obtained and reviewed to evaluate the vascular anatomy.  Contrast: 80mL OMNIPAQUE IOHEXOL 350 MG/ML SOLN  Comparison: 11/18/2010  Findings: No filling defects  in the pulmonary arteries to suggest pulmonary emboli.  Heart is heart is borderline in size. Previously seen mediastinal adenopathy has improved.  No enlarged mediastinal, hilar or axillary lymph nodes currently. Emphysematous changes within the lungs.  Scarring in the bases.  No acute opacities or effusions.  Imaging into the upper abdomen shows no acute findings.  No acute bony abnormality.  IMPRESSION: No evidence of pulmonary embolus.  Emphysematous changes.  Scarring in the bases.   Original Report Authenticated By: Charlett Nose, M.D.     Scheduled Meds:   . albuterol  2.5 mg Nebulization Q6H WA  . amoxicillin-clavulanate  1 tablet Oral Q12H  . aspirin EC  81 mg Oral Daily  . budesonide-formoterol  2 puff Inhalation BID  . Chlorhexidine Gluconate Cloth  6 each Topical Q0600  . enoxaparin (LOVENOX) injection  40 mg Subcutaneous Q24H  . fluticasone  2 spray Each Nare Daily  . glimepiride  1 mg Oral QAC breakfast  . insulin aspart  0-20 Units Subcutaneous TID WC  . insulin aspart  6 Units Subcutaneous TID WC  . insulin glargine  16 Units Subcutaneous QHS  . ipratropium  0.5 mg Nebulization Q6H WA  . loratadine  10 mg Oral Daily  . mupirocin ointment  1 application Nasal BID  . nicotine  21 mg Transdermal Daily  . pantoprazole  40 mg Oral Daily  . potassium chloride SA  20 mEq Oral BID  . predniSONE  40 mg Oral BID WC  . sertraline  25 mg Oral q  morning - 10a  . sodium chloride  3 mL Intravenous Q12H  . spironolactone  25 mg Oral q morning - 10a  . torsemide  100 mg Oral BID   Continuous Infusions:   Principal Problem:  *COPD with acute exacerbation Active Problems:  PULMONARY SARCOIDOSIS  Morbid obesity  HYPERTENSION  GERD  Tobacco abuse  DM type 2 (diabetes mellitus, type 2)  Knee pain, right    Time spent: 40 minutes   [facility]  [facility] Pager [phone]. If 8PM-8AM, please contact night-coverage at [email or web address], password [facility] 09/05/2012, 7:27 AM  LOS: 4 days

## 2012-09-06 ENCOUNTER — Telehealth: Payer: Self-pay | Admitting: General Practice

## 2012-09-06 NOTE — Telephone Encounter (Signed)
Transitional call made:  Spoke with patient and she did not have any questions about discharge instructions or medications.  Patient did say that she is still having a lot of shortness of breath and that she is able to ambulate only short distances without having to sit down.  Patient has requested a shower chair and asked if there would be a possibility of a home health aid coming out maybe 2 times a week to help with bathing.  I told patient I would direct these question to Dr. Felicity Coyer and get back to her.  Patient does live alone.  Follow up appointment made for Monday 12/23.

## 2012-09-09 ENCOUNTER — Ambulatory Visit: Payer: Medicare Other | Admitting: Cardiology

## 2012-09-13 ENCOUNTER — Other Ambulatory Visit: Payer: Self-pay | Admitting: Internal Medicine

## 2012-09-16 ENCOUNTER — Encounter: Payer: Self-pay | Admitting: Internal Medicine

## 2012-09-16 ENCOUNTER — Ambulatory Visit (INDEPENDENT_AMBULATORY_CARE_PROVIDER_SITE_OTHER): Payer: Medicare Other | Admitting: Internal Medicine

## 2012-09-16 VITALS — BP 112/70 | HR 95 | Temp 97.0°F | Ht 68.5 in | Wt 283.0 lb

## 2012-09-16 DIAGNOSIS — E662 Morbid (severe) obesity with alveolar hypoventilation: Secondary | ICD-10-CM

## 2012-09-16 DIAGNOSIS — I5032 Chronic diastolic (congestive) heart failure: Secondary | ICD-10-CM

## 2012-09-16 DIAGNOSIS — E1165 Type 2 diabetes mellitus with hyperglycemia: Secondary | ICD-10-CM

## 2012-09-16 DIAGNOSIS — J9610 Chronic respiratory failure, unspecified whether with hypoxia or hypercapnia: Secondary | ICD-10-CM

## 2012-09-16 MED ORDER — FLUCONAZOLE 150 MG PO TABS: 150.0000 mg | ORAL_TABLET | Freq: Once | ORAL | Status: DC

## 2012-09-16 MED ORDER — OXYCODONE HCL 10 MG PO TABS: 5.0000 mg | ORAL_TABLET | Freq: Three times a day (TID) | ORAL | Status: DC | PRN

## 2012-09-16 NOTE — Assessment & Plan Note (Signed)
New dx, clarified 11/2011 - exacerbated by frequent pred use Intol of metformin due to severe diarrhea On Lantus pen qhs and Amaryl in AM - titrate as needed recheck a1c q 3 mo Lab Results  Component Value Date   HGBA1C 5.9* 09/01/2012

## 2012-09-16 NOTE — Progress Notes (Signed)
Subjective:    [name], female    DOB: 1966-03-11, 46 y.o.   MRN: [id number]  HPI Here for hospital follow up - DC 09/05/12 for COPD exac and R knee effusion Principal Problem:  *COPD with acute exacerbation Active Problems:  PULMONARY SARCOIDOSIS  Morbid obesity  HYPERTENSION  GERD  Tobacco abuse  DM type 2 (diabetes mellitus, type 2)  Knee pain, right  also reviewed chronic medical issues:  chronic hypoxic resp failure - home O2 dep since 2010 - overlap with OSA/OHS and obst dz per pulm - BiPAP qhs - reports compliance with ongoing medical treatment and home O2 and no changes in medication dose or frequency. denies adverse side effects related to current therapy. Quit smoking 08/2010, but resumed 05/2012  hypertension - reports compliance with ongoing medical treatment and no changes in medication dose or frequency. denies adverse side effects related to current therapy - prior rash from dilt resolved  dCHF, chronic - IP echo 01/2011 reviewed: normal LVEF - reports compliance with ongoing medical treatment. denies adverse side effects related to current therapy. improved edema but increased dyspnea on exertion  - continues bid diuretic - eval by cards for same 02/2011 - to limit fluids <2L/d  morbid obesity - weights reviewed - often has fluid related weight variance but remains obese  Anxiety/depression - chronic hx same with daily BZ use - changed from klonopin to xanax 07/2010 due to "too sleepy"  With klonopin, also on sertraline - the patient reports compliance with medication(s) as prescribed. Denies adverse side effects.   dyslipidemia -started on simva spring 2011- reports intermittent compliance with ongoing medical treatment and no changes in medication dose or frequency. denies adverse side effects related to current therapy.   fibromyalgia - chronic pain - on daily narcotics for years - pain affects chest, back, legs and "whole body" - resumed  flexeril  DM2, mild - dx clarified 3/13 - check cbgs BID - AM fasting <150, none over 200 since going home - intolerant of metformin due to severe diarrhea - now on Lantus pen + amaryl, no hypoglycemia   Past Medical History  Diagnosis Date  . Sleep apnea     noncompliant w/ CPAP  . Hypertension   . Hyperlipidemia   . Chronic headache   . Fibromyalgia     daily narcotics  . Anxiety     hx chronic BZ use, stopped 07/2010  . Anemia   . Pulmonary sarcoidosis     unimpressive CT chest 2011  . Colonic polyp   . GERD (gastroesophageal reflux disease)   . ALLERGIC RHINITIS   . Asthma   . CHF (congestive heart failure)     Diastolic with fluid overload, May, 2012, LVEF 60%  . Morbid obesity   . Depression   . Panic attacks   . Diabetes mellitus   . COPD (chronic obstructive pulmonary disease)     on home O2, moderate airflow obstruction, suspect d/t emphysema  . Obesity   . Elevated LFTs 09/2011  . Ovarian cyst     Review of Systems  Constitutional: Positive for fatigue. Negative for chills and unexpected weight change.  Respiratory: Positive for cough and shortness of breath.   Cardiovascular: Negative for chest pain and leg swelling.  Musculoskeletal: Positive for back pain. Negative for joint swelling.       Objective:   Physical Exam  BP 112/70  Pulse 95  Temp 97 F (36.1 C) (Oral)  Ht 5' 8.5" (1.74  m)  Wt 283 lb (128.368 kg)  BMI 42.40 kg/m2  SpO2 93%  LMP 12/28/2010 Wt Readings from Last 3 Encounters:  09/16/12 283 lb (128.368 kg)  09/01/12 282 lb (127.914 kg)  07/19/12 291 lb (131.997 kg)   Constitutional: She appears well-developed and well-nourished. Morbidly obese.  Cardiovascular: Normal rate, regular rhythm and normal heart sounds.  BLE with trace edema Pulmonary/Chest: Breath sounds diminished bilateral bases but no respiratory distress. She has no wheezes. Abdomen: obese, SNTND + BS Psychiatric: She has a normal mood and affect. Her behavior is  normal.   Lab Results  Component Value Date   WBC 15.9* 09/02/2012   HGB 13.7 09/02/2012   HCT 41.6 09/02/2012   PLT 237 09/02/2012   GLUCOSE 195* 09/02/2012   CHOL 255* 02/13/2012   TRIG 133.0 02/13/2012   HDL 54.30 02/13/2012   LDLDIRECT 186.4 02/13/2012   LDLCALC  Value: 92        Total Cholesterol/HDL:CHD Risk Coronary Heart Disease Risk Table                     Men   Women  1/2 Average Risk   3.4   3.3  Average Risk       5.0   4.4  2 X Average Risk   9.6   7.1  3 X Average Risk  23.4   11.0        Use the calculated Patient Ratio above and the CHD Risk Table to determine the patient's CHD Risk.        ATP III CLASSIFICATION (LDL):  <100     mg/dL   Optimal  454-098  mg/dL   Near or Above                    Optimal  130-159  mg/dL   Borderline  119-147  mg/dL   High  >829     mg/dL   Very High 5/62/1308   ALT 23 09/01/2012   AST 22 09/01/2012   NA 134* 09/02/2012   K 4.3 09/02/2012   CL 95* 09/02/2012   CREATININE 0.78 09/02/2012   BUN 15 09/02/2012   CO2 29 09/02/2012   TSH 0.701 04/22/2012   HGBA1C 5.9* 09/01/2012       Assessment & Plan:   see problem list. Medications and labs reviewed today.  Time spent with pt today 25 minutes, greater than 50% time spent counseling patient on COPD exac, knee pain, and medication review. Also review of hospital records  Tobacco abuse - resumed smoking 05/2012 -  resumed chantix 06/2012 as did well for >2 years s/p tx until 05/2012 relapse

## 2012-09-16 NOTE — Patient Instructions (Signed)
It was good to see you today. We have reviewed your prior records including labs and tests today Medications reviewed and updated, no changes at this time. Use diflucan for yeast symptoms as needed we'll make referral to [facility] for home aide to help you at home . Our office will contact you regarding appointment(s) once made. Please schedule followup in 3-4 months for diabetes mellitus check, call sooner if problems.

## 2012-09-16 NOTE — Assessment & Plan Note (Signed)
Last acute exac 10/2011 and 12/2011- treated with temporary increase diuretic Off amlodipine 05/08/12 due to low blood pressure (asymptomatic) Continue monitor sodium intake and monitor weights at home - call if increase weight >3#/day Encouragement provided on continued weight loss efforts

## 2012-09-16 NOTE — Assessment & Plan Note (Signed)
On bipap - baseline hypercarbia reviewed follow up pulm as planned  

## 2012-09-16 NOTE — Assessment & Plan Note (Signed)
Multifactorial: COPD, sarcoid, cdHF, OHS/OSA O2 as rx'd - fu with pulm as planned No med changes recommended

## 2012-09-17 ENCOUNTER — Emergency Department (HOSPITAL_COMMUNITY)
Admission: EM | Admit: 2012-09-17 | Discharge: 2012-09-17 | Disposition: A | Discharge status: Home / Self Care | Payer: PRIVATE HEALTH INSURANCE | Attending: Emergency Medicine | Admitting: Emergency Medicine

## 2012-09-17 ENCOUNTER — Encounter (HOSPITAL_COMMUNITY): Payer: Self-pay | Admitting: *Deleted

## 2012-09-17 ENCOUNTER — Emergency Department (HOSPITAL_COMMUNITY): Payer: PRIVATE HEALTH INSURANCE

## 2012-09-17 ENCOUNTER — Encounter: Payer: Self-pay | Admitting: Cardiology

## 2012-09-17 DIAGNOSIS — F17200 Nicotine dependence, unspecified, uncomplicated: Secondary | ICD-10-CM | POA: Insufficient documentation

## 2012-09-17 DIAGNOSIS — Z8742 Personal history of other diseases of the female genital tract: Secondary | ICD-10-CM | POA: Insufficient documentation

## 2012-09-17 DIAGNOSIS — F329 Major depressive disorder, single episode, unspecified: Secondary | ICD-10-CM | POA: Insufficient documentation

## 2012-09-17 DIAGNOSIS — M940 Chondrocostal junction syndrome [Tietze]: Secondary | ICD-10-CM

## 2012-09-17 DIAGNOSIS — Z79899 Other long term (current) drug therapy: Secondary | ICD-10-CM | POA: Insufficient documentation

## 2012-09-17 DIAGNOSIS — F3289 Other specified depressive episodes: Secondary | ICD-10-CM | POA: Insufficient documentation

## 2012-09-17 DIAGNOSIS — K219 Gastro-esophageal reflux disease without esophagitis: Secondary | ICD-10-CM | POA: Insufficient documentation

## 2012-09-17 DIAGNOSIS — Z860100 Personal history of colon polyps, unspecified: Secondary | ICD-10-CM | POA: Insufficient documentation

## 2012-09-17 DIAGNOSIS — J45909 Unspecified asthma, uncomplicated: Secondary | ICD-10-CM | POA: Insufficient documentation

## 2012-09-17 DIAGNOSIS — Z8619 Personal history of other infectious and parasitic diseases: Secondary | ICD-10-CM | POA: Insufficient documentation

## 2012-09-17 DIAGNOSIS — R079 Chest pain, unspecified: Secondary | ICD-10-CM

## 2012-09-17 DIAGNOSIS — I10 Essential (primary) hypertension: Secondary | ICD-10-CM | POA: Insufficient documentation

## 2012-09-17 DIAGNOSIS — Z862 Personal history of diseases of the blood and blood-forming organs and certain disorders involving the immune mechanism: Secondary | ICD-10-CM | POA: Insufficient documentation

## 2012-09-17 DIAGNOSIS — IMO0001 Reserved for inherently not codable concepts without codable children: Secondary | ICD-10-CM | POA: Insufficient documentation

## 2012-09-17 DIAGNOSIS — J4489 Other specified chronic obstructive pulmonary disease: Secondary | ICD-10-CM | POA: Insufficient documentation

## 2012-09-17 DIAGNOSIS — R0600 Dyspnea, unspecified: Secondary | ICD-10-CM

## 2012-09-17 DIAGNOSIS — J449 Chronic obstructive pulmonary disease, unspecified: Secondary | ICD-10-CM | POA: Insufficient documentation

## 2012-09-17 DIAGNOSIS — Z794 Long term (current) use of insulin: Secondary | ICD-10-CM | POA: Insufficient documentation

## 2012-09-17 DIAGNOSIS — R0989 Other specified symptoms and signs involving the circulatory and respiratory systems: Secondary | ICD-10-CM | POA: Insufficient documentation

## 2012-09-17 DIAGNOSIS — E119 Type 2 diabetes mellitus without complications: Secondary | ICD-10-CM | POA: Insufficient documentation

## 2012-09-17 DIAGNOSIS — E785 Hyperlipidemia, unspecified: Secondary | ICD-10-CM | POA: Insufficient documentation

## 2012-09-17 DIAGNOSIS — I5030 Unspecified diastolic (congestive) heart failure: Secondary | ICD-10-CM | POA: Insufficient documentation

## 2012-09-17 DIAGNOSIS — F411 Generalized anxiety disorder: Secondary | ICD-10-CM | POA: Insufficient documentation

## 2012-09-17 DIAGNOSIS — IMO0002 Reserved for concepts with insufficient information to code with codable children: Secondary | ICD-10-CM | POA: Insufficient documentation

## 2012-09-17 DIAGNOSIS — Z7982 Long term (current) use of aspirin: Secondary | ICD-10-CM | POA: Insufficient documentation

## 2012-09-17 DIAGNOSIS — R0609 Other forms of dyspnea: Secondary | ICD-10-CM | POA: Insufficient documentation

## 2012-09-17 LAB — CBC
MCH: 26 pg (ref 26.0–34.0)
MCHC: 32.6 g/dL (ref 30.0–36.0)
Platelets: 184 10*3/uL (ref 150–400)

## 2012-09-17 LAB — COMPREHENSIVE METABOLIC PANEL
ALT: 98 U/L — ABNORMAL HIGH (ref 0–35)
AST: 45 U/L — ABNORMAL HIGH (ref 0–37)
Calcium: 9 mg/dL (ref 8.4–10.5)
Creatinine, Ser: 1.01 mg/dL (ref 0.50–1.10)
GFR calc Af Amer: 77 mL/min — ABNORMAL LOW (ref >90)
Glucose, Bld: 158 mg/dL — ABNORMAL HIGH (ref 70–99)
Sodium: 136 mEq/L (ref 135–145)
Total Protein: 7.2 g/dL (ref 6.0–8.3)

## 2012-09-17 LAB — POCT I-STAT TROPONIN I

## 2012-09-17 MED ORDER — PREDNISONE 50 MG PO TABS: ORAL_TABLET | ORAL | Status: DC

## 2012-09-17 MED ORDER — IBUPROFEN 600 MG PO TABS: 600.0000 mg | ORAL_TABLET | Freq: Four times a day (QID) | ORAL | Status: DC | PRN

## 2012-09-17 MED ORDER — IBUPROFEN 200 MG PO TABS
600.0000 mg | ORAL_TABLET | Freq: Once | ORAL | Status: AC
  Administered 2012-09-17: 600 mg via ORAL
  Filled 2012-09-17: qty 1

## 2012-09-17 MED ORDER — PREDNISONE 20 MG PO TABS
60.0000 mg | ORAL_TABLET | Freq: Once | ORAL | Status: AC
  Administered 2012-09-17: 60 mg via ORAL
  Filled 2012-09-17: qty 3

## 2012-09-17 NOTE — ED Provider Notes (Signed)
History     CSN: [id number]  Arrival date & time 09/17/12  1652   First MD Initiated Contact with Patient 09/17/12 1841      Chief Complaint  Patient presents with  . Shortness of Breath    (Consider location/radiation/quality/duration/timing/severity/associated sxs/prior treatment) Patient is a 46 y.o. female presenting with general illness. The history is provided by the patient. No language interpreter was used.  Illness  The current episode started yesterday. The problem occurs continuously. The problem has been unchanged. The problem is moderate. Nothing relieves the symptoms. Nothing aggravates the symptoms. Pertinent negatives include no fever, no abdominal pain, no constipation, no diarrhea, no nausea, no vomiting, no congestion, no headaches, no sore throat, no cough and no rash.    Past Medical History  Diagnosis Date  . Sleep apnea     noncompliant w/ CPAP  . Hypertension   . Hyperlipidemia   . Chronic headache   . Fibromyalgia     daily narcotics  . Anxiety     hx chronic BZ use, stopped 07/2010  . Anemia   . Pulmonary sarcoidosis     unimpressive CT chest 2011  . Colonic polyp   . GERD (gastroesophageal reflux disease)   . ALLERGIC RHINITIS   . Asthma   . CHF (congestive heart failure)     Diastolic with fluid overload, May, 2012, LVEF 60%  . Morbid obesity   . Depression   . Panic attacks   . Diabetes mellitus   . COPD (chronic obstructive pulmonary disease)     on home O2, moderate airflow obstruction, suspect d/t emphysema  . Obesity   . Elevated LFTs 09/2011  . Ovarian cyst     Past Surgical History  Procedure Date  . Polypectomy 2011  . Lumbar microdiscectomy 07/06/2011    R L4-5, stern  . Back surgery   . Carpal tunnel release   . Steroid spinal injections     Family History  Problem Relation Age of Onset  . Hypertension Mother   . Emphysema Father   . Hypertension Father   . Stomach cancer Father   . Allergies Brother   .  Hypertension Brother   . Stomach cancer Brother   . Heart disease Mother   . Heart disease Father   . Heart disease Brother     History  Substance Use Topics  . Smoking status: Current Every Day Smoker -- 1.0 packs/day for 29 years    Types: Cigarettes    Last Attempt to Quit: 09/14/2010  . Smokeless tobacco: Never Used     Comment: Resumed smoking September 2013  . Alcohol Use: Yes     Comment: occasionally    OB History    Grav Para Term Preterm Abortions TAB SAB Ect Mult Living                  Review of Systems  Constitutional: Negative for fever and chills.  HENT: Negative for congestion and sore throat.   Respiratory: Positive for shortness of breath. Negative for cough.   Cardiovascular: Positive for chest pain. Negative for leg swelling.  Gastrointestinal: Negative for nausea, vomiting, abdominal pain, diarrhea and constipation.  Genitourinary: Negative for dysuria and frequency.  Skin: Negative for color change and rash.  Neurological: Negative for dizziness and headaches.  Psychiatric/Behavioral: Negative for confusion and agitation.  All other systems reviewed and are negative.    Allergies  Ciprofloxacin; Latex; Sulfamethoxazole w-trimethoprim; Azithromycin; Doxycycline; Metformin and related; and Metronidazole  Home  Medications   Current Outpatient Rx  Name  Route  Sig  Dispense  Refill  . ALBUTEROL SULFATE HFA 108 (90 BASE) MCG/ACT IN AERS   Inhalation   Inhale 2 puffs into the lungs every 6 (six) hours as needed. For wheeze or shortness of breath   1 Inhaler   0   . ALBUTEROL SULFATE (2.5 MG/3ML) 0.083% IN NEBU   Nebulization   Take 2.5 mg by nebulization every 6 (six) hours as needed. For wheeze or shortness of breath         . ALPRAZOLAM 1 MG PO TABS   Oral   Take 1 tablet (1 mg total) by mouth 2 (two) times daily as needed. anxiety   60 tablet   3   . ASPIRIN 81 MG PO TBEC   Oral   Take 81 mg by mouth daily.         .  BUDESONIDE-FORMOTEROL FUMARATE 160-4.5 MCG/ACT IN AERO   Inhalation   Inhale 2 puffs into the lungs 2 (two) times daily.   1 Inhaler   2   . CETIRIZINE HCL 10 MG PO TABS   Oral   Take 10 mg by mouth at bedtime.         Marland Kitchen ESOMEPRAZOLE MAGNESIUM 40 MG PO CPDR   Oral   Take 40 mg by mouth 2 (two) times daily.         Marland Kitchen GLIMEPIRIDE 1 MG PO TABS   Oral   Take 1 mg by mouth daily before breakfast.         . INSULIN GLARGINE 100 UNIT/ML [place] SOLN   Subcutaneous   Inject 8-16 Units into the skin at bedtime. If blood sugar is over 100, patient uses 16 units. If under 100, patient uses 8 units         . MOMETASONE FUROATE 50 MCG/ACT NA SUSP   Nasal   Place 2 sprays into the nose daily.         Marland Kitchen NICOTINE 21 MG/24HR TD PT24   Transdermal   Place 1 patch onto the skin daily.         . OXYCODONE HCL 10 MG PO TABS   Oral   Take 5-10 mg by mouth 3 (three) times daily as needed. For pain; Do not fill before 26th of month         . POTASSIUM CHLORIDE CRYS ER 20 MEQ PO TBCR   Oral   Take 20 mEq by mouth 2 (two) times daily.          . SERTRALINE HCL 25 MG PO TABS      TAKE 1 TABLET BY MOUTH DAILY   30 tablet   5   . SPIRONOLACTONE 25 MG PO TABS   Oral   Take 25 mg by mouth every morning. Take 1 by mouth daily         . TIOTROPIUM BROMIDE MONOHYDRATE 18 MCG IN CAPS   Inhalation   Place 18 mcg into inhaler and inhale daily.         . TORSEMIDE 100 MG PO TABS   Oral   Take 1 tablet (100 mg total) by mouth 2 (two) times daily.   60 tablet   5   . FLUCONAZOLE 150 MG PO TABS   Oral   Take 1 tablet (150 mg total) by mouth once. May repeat dose next day if needed   2 tablet   0     BP  106/56  Pulse 78  Temp 98.1 F (36.7 C) (Oral)  Resp 22  SpO2 99%  LMP 12/28/2010  Physical Exam  Vitals reviewed. Constitutional: She is oriented to person, place, and time. She appears well-developed and well-nourished. No distress.  HENT:  Head: Normocephalic and  atraumatic.  Eyes: EOM are normal. Pupils are equal, round, and reactive to light.  Neck: Normal range of motion. Neck supple. No JVD present.  Cardiovascular: Normal rate and regular rhythm.     Pulmonary/Chest: Effort normal. No respiratory distress. She has decreased breath sounds. She has no wheezes. She has no rhonchi. She has no rales.  Abdominal: Soft. She exhibits no distension.  Musculoskeletal: Normal range of motion. She exhibits no edema.       Right lower leg: She exhibits no swelling and no edema.       Left lower leg: She exhibits no swelling and no edema.  Neurological: She is alert and oriented to person, place, and time.  Skin: Skin is warm and dry.  Psychiatric: She has a normal mood and affect. Her behavior is normal.    ED Course  Procedures (including critical care time)  Labs Reviewed  CBC - Abnormal; Notable for the following:    WBC 14.6 (*)     RBC 5.73 (*)     RDW 17.2 (*)     All other components within normal limits  COMPREHENSIVE METABOLIC PANEL - Abnormal; Notable for the following:    Potassium 3.1 (*)     Chloride 94 (*)     Glucose, Bld 158 (*)     Albumin 3.3 (*)     AST 45 (*)     ALT 98 (*)     Alkaline Phosphatase 134 (*)     GFR calc non Af Amer 66 (*)     GFR calc Af Amer 77 (*)     All other components within normal limits  PRO B NATRIURETIC PEPTIDE  POCT I-STAT TROPONIN I   Dg Chest 2 View  09/17/2012  *RADIOLOGY REPORT*  Clinical Data: Shortness of breath  CHEST - 2 VIEW  Comparison: 09/01/2012  Findings: Cardiomediastinal silhouette is stable.  No acute infiltrate or pleural effusion.  No pulmonary edema.  Stable emphysematous changes and scarring at the lung bases.  Bony thorax is unremarkable.  IMPRESSION: No active disease.  Stable emphysematous changes and scarring at the lung bases.   Original Report Authenticated By: Natasha Mead, M.D.      No diagnosis found.    MDM  Pt w/ pmhx of copd, sarcoidosis presents w/ dyspnea  and chest pain. Was recently discharged from hospital for COPD exacerbation, completed course of abx and prednisone. States her breathing improved w/ prednisone however last pm had gradual worsening dyspnea. Pt also admits to left sided chest pain - sharp, non radiating, exacerbated w/ movement, palpation and inspiration. No hx of CAD. Denies change in baseline cough. Denies exertional component to pain. No change in her baseline oxygen requirement.   Exam - obese, afebrile, rr 22, 02 sats 98% on 2L [place] (home dose), normotensive, HR 90s, well appearing no resp distress. Lungs diminished bilaterally, no LE edema or JVD. Chest wall ttp on left side - states this is her exact pain.   Plan: likely sarcoidosis flare w/ costochondritis. Based on hx and exam doubt COPD or CHF exacerbation. Doubt ACS or PE. Will check CXR, troponin, bnp, cmp, cbc, ecg and give motrin for chest wall pain.  Course -  reassessed, vitals stable, NAD, cxr - nacpf, ECG - NSR no acute ischemic changes - no twi or st elevation/depression, WBC 14.6 - likely leukemoid reaction from steroids. bnp normal, troponin negative. pts sats remain >92 w/ ambulation. At this time i feel that her sx are likely 2/2 sarcoidosis. Will give 60mg  prednisone in ED. And 5 day burst 50mg . Recommend follow up w/ pcp in 2 days. May need longer course of steroids. Given strict return precautions and follow up instructions.   1. Dyspnea   2. Chest pain   3. Costochondritis, acute    New Prescriptions   IBUPROFEN (ADVIL,MOTRIN) 600 MG TABLET    Take 1 tablet (600 mg total) by mouth every 6 (six) hours as needed for pain.   PREDNISONE (DELTASONE) 50 MG TABLET    3 tabs po day one, then 2 tabs daily x 4 days   Newt Lukes, MD [address] [place] [address] [phone]  Schedule an appointment as soon as possible for a visit on 09/20/2012  [facility] EMERGENCY DEPARTMENT [address] [id number] [address] [address] [phone]           Audelia Hives, MD 09/18/12 [phone]

## 2012-09-17 NOTE — ED Notes (Signed)
The patient is AOx4 and comfortable with her discharge instructions. 

## 2012-09-17 NOTE — ED Notes (Signed)
Pt is here with chest tightness and shortness of breath that started last nite.  Pt thought it was gas, but she states she woke up with it this am.  Pt reports nebulizers donot work.  Pt talking in complete sentences and reports sharp pain through the back of her neck

## 2012-09-18 NOTE — ED Provider Notes (Signed)
I saw and evaluated the patient, reviewed the resident's note and I agree with the findings and plan. The patient presents with shortness of breath.  She has a history of sarcoidosis, copd and was recently admitted to the hospital for this.  She is on home oxygen at 2lnc.  She denies any fevers, chills, or productive cough.  On exam, the patient is afebrile and the vitals are stable.  The heart is regular rate and rhythm and the lungs are essentially clear.  The abdomen is benign.  There is no edema.    The workup is unremarkable with the exception of an elevated wbc.  I am unsure of the cause of this, however she did finish prednisone recently.  She was ambulated and is maintaining saturations in the lower 90's while on her 2 lnc.  She wants to go home and I feel as though this is appropriate.  We will prescribe an additional course of prednisone.  Geoffery Lyons, MD 09/18/12 1800

## 2012-09-19 ENCOUNTER — Ambulatory Visit: Payer: Medicare Other | Admitting: Cardiology

## 2012-09-20 ENCOUNTER — Ambulatory Visit: Payer: Medicare Other | Admitting: Emergency Medicine

## 2012-09-23 ENCOUNTER — Ambulatory Visit: Payer: Medicare Other

## 2012-09-24 ENCOUNTER — Emergency Department (HOSPITAL_COMMUNITY)
Admission: EM | Admit: 2012-09-24 | Discharge: 2012-09-24 | Disposition: A | Discharge status: Home / Self Care | Payer: PRIVATE HEALTH INSURANCE | Attending: Emergency Medicine | Admitting: Emergency Medicine

## 2012-09-24 ENCOUNTER — Emergency Department (HOSPITAL_COMMUNITY): Payer: PRIVATE HEALTH INSURANCE

## 2012-09-24 DIAGNOSIS — IMO0002 Reserved for concepts with insufficient information to code with codable children: Secondary | ICD-10-CM | POA: Insufficient documentation

## 2012-09-24 DIAGNOSIS — M79609 Pain in unspecified limb: Secondary | ICD-10-CM

## 2012-09-24 DIAGNOSIS — Z860100 Personal history of colon polyps, unspecified: Secondary | ICD-10-CM | POA: Insufficient documentation

## 2012-09-24 DIAGNOSIS — Z862 Personal history of diseases of the blood and blood-forming organs and certain disorders involving the immune mechanism: Secondary | ICD-10-CM | POA: Insufficient documentation

## 2012-09-24 DIAGNOSIS — Z8619 Personal history of other infectious and parasitic diseases: Secondary | ICD-10-CM | POA: Insufficient documentation

## 2012-09-24 DIAGNOSIS — F3289 Other specified depressive episodes: Secondary | ICD-10-CM | POA: Insufficient documentation

## 2012-09-24 DIAGNOSIS — Z8679 Personal history of other diseases of the circulatory system: Secondary | ICD-10-CM | POA: Insufficient documentation

## 2012-09-24 DIAGNOSIS — Z9981 Dependence on supplemental oxygen: Secondary | ICD-10-CM | POA: Insufficient documentation

## 2012-09-24 DIAGNOSIS — E785 Hyperlipidemia, unspecified: Secondary | ICD-10-CM | POA: Insufficient documentation

## 2012-09-24 DIAGNOSIS — G8929 Other chronic pain: Secondary | ICD-10-CM | POA: Insufficient documentation

## 2012-09-24 DIAGNOSIS — I10 Essential (primary) hypertension: Secondary | ICD-10-CM | POA: Insufficient documentation

## 2012-09-24 DIAGNOSIS — IMO0001 Reserved for inherently not codable concepts without codable children: Secondary | ICD-10-CM | POA: Insufficient documentation

## 2012-09-24 DIAGNOSIS — Z79899 Other long term (current) drug therapy: Secondary | ICD-10-CM | POA: Insufficient documentation

## 2012-09-24 DIAGNOSIS — Z794 Long term (current) use of insulin: Secondary | ICD-10-CM | POA: Insufficient documentation

## 2012-09-24 DIAGNOSIS — Z8709 Personal history of other diseases of the respiratory system: Secondary | ICD-10-CM | POA: Insufficient documentation

## 2012-09-24 DIAGNOSIS — F439 Reaction to severe stress, unspecified: Secondary | ICD-10-CM

## 2012-09-24 DIAGNOSIS — J45909 Unspecified asthma, uncomplicated: Secondary | ICD-10-CM | POA: Insufficient documentation

## 2012-09-24 DIAGNOSIS — J449 Chronic obstructive pulmonary disease, unspecified: Secondary | ICD-10-CM | POA: Insufficient documentation

## 2012-09-24 DIAGNOSIS — F410 Panic disorder [episodic paroxysmal anxiety] without agoraphobia: Secondary | ICD-10-CM | POA: Insufficient documentation

## 2012-09-24 DIAGNOSIS — Z7982 Long term (current) use of aspirin: Secondary | ICD-10-CM | POA: Insufficient documentation

## 2012-09-24 DIAGNOSIS — F329 Major depressive disorder, single episode, unspecified: Secondary | ICD-10-CM | POA: Insufficient documentation

## 2012-09-24 DIAGNOSIS — F430 Acute stress reaction: Secondary | ICD-10-CM | POA: Insufficient documentation

## 2012-09-24 DIAGNOSIS — R197 Diarrhea, unspecified: Secondary | ICD-10-CM | POA: Insufficient documentation

## 2012-09-24 DIAGNOSIS — R112 Nausea with vomiting, unspecified: Secondary | ICD-10-CM | POA: Insufficient documentation

## 2012-09-24 DIAGNOSIS — M797 Fibromyalgia: Secondary | ICD-10-CM

## 2012-09-24 DIAGNOSIS — M549 Dorsalgia, unspecified: Secondary | ICD-10-CM | POA: Insufficient documentation

## 2012-09-24 DIAGNOSIS — E119 Type 2 diabetes mellitus without complications: Secondary | ICD-10-CM | POA: Insufficient documentation

## 2012-09-24 DIAGNOSIS — F419 Anxiety disorder, unspecified: Secondary | ICD-10-CM

## 2012-09-24 DIAGNOSIS — I5030 Unspecified diastolic (congestive) heart failure: Secondary | ICD-10-CM | POA: Insufficient documentation

## 2012-09-24 DIAGNOSIS — K219 Gastro-esophageal reflux disease without esophagitis: Secondary | ICD-10-CM | POA: Insufficient documentation

## 2012-09-24 DIAGNOSIS — J4489 Other specified chronic obstructive pulmonary disease: Secondary | ICD-10-CM | POA: Insufficient documentation

## 2012-09-24 DIAGNOSIS — F17200 Nicotine dependence, unspecified, uncomplicated: Secondary | ICD-10-CM | POA: Insufficient documentation

## 2012-09-24 DIAGNOSIS — Z8742 Personal history of other diseases of the female genital tract: Secondary | ICD-10-CM | POA: Insufficient documentation

## 2012-09-24 LAB — TROPONIN I
Troponin I: <0.3 ng/mL (ref <0.30)
Troponin I: <0.3 ng/mL (ref <0.30)

## 2012-09-24 LAB — COMPREHENSIVE METABOLIC PANEL
Albumin: 3.9 g/dL (ref 3.5–5.2)
BUN: 17 mg/dL (ref 6–23)
CO2: 27 mEq/L (ref 19–32)
Calcium: 9.5 mg/dL (ref 8.4–10.5)
Chloride: 92 mEq/L — ABNORMAL LOW (ref 96–112)
Creatinine, Ser: 0.94 mg/dL (ref 0.50–1.10)
GFR calc non Af Amer: 72 mL/min — ABNORMAL LOW (ref >90)
Total Bilirubin: 1 mg/dL (ref 0.3–1.2)

## 2012-09-24 LAB — CBC WITH DIFFERENTIAL/PLATELET
Basophils Relative: 0 % (ref 0–1)
Eosinophils Absolute: 0.3 10*3/uL (ref 0.0–0.7)
Eosinophils Relative: 2 % (ref 0–5)
Lymphs Abs: 2.7 10*3/uL (ref 0.7–4.0)
MCH: 26.6 pg (ref 26.0–34.0)
MCHC: 33.9 g/dL (ref 30.0–36.0)
MCV: 78.7 fL (ref 78.0–100.0)
Neutrophils Relative %: 74 % (ref 43–77)
Platelets: 222 10*3/uL (ref 150–400)
RBC: 6.23 MIL/uL — ABNORMAL HIGH (ref 3.87–5.11)

## 2012-09-24 LAB — LIPASE, BLOOD: Lipase: 43 U/L (ref 11–59)

## 2012-09-24 MED ORDER — LORAZEPAM 2 MG/ML IJ SOLN
0.5000 mg | Freq: Once | INTRAMUSCULAR | Status: AC
  Administered 2012-09-24: 0.5 mg via INTRAVENOUS
  Filled 2012-09-24: qty 1

## 2012-09-24 MED ORDER — SODIUM CHLORIDE 0.9 % IV BOLUS (SEPSIS)
1000.0000 mL | Freq: Once | INTRAVENOUS | Status: AC
  Administered 2012-09-24: 1000 mL via INTRAVENOUS

## 2012-09-24 MED ORDER — FENTANYL CITRATE 0.05 MG/ML IJ SOLN
50.0000 ug | Freq: Once | INTRAMUSCULAR | Status: AC
  Administered 2012-09-24: 50 ug via INTRAVENOUS
  Filled 2012-09-24: qty 2

## 2012-09-24 MED ORDER — ONDANSETRON HCL 4 MG/2ML IJ SOLN
4.0000 mg | Freq: Once | INTRAMUSCULAR | Status: AC
  Administered 2012-09-24: 4 mg via INTRAVENOUS
  Filled 2012-09-24: qty 2

## 2012-09-24 MED ORDER — ASPIRIN 81 MG PO CHEW: 324.0000 mg | CHEWABLE_TABLET | Freq: Once | ORAL | Status: AC

## 2012-09-24 NOTE — ED Provider Notes (Signed)
Medical screening examination/treatment/procedure(s) were performed by non-physician practitioner and as supervising physician I was immediately available for consultation/collaboration.   Gavin Pound. Oletta Lamas, MD 09/24/12 0865

## 2012-09-24 NOTE — ED Provider Notes (Signed)
History     CSN: [id number]  Arrival date & time 09/24/12  1500   First MD Initiated Contact with Patient 09/24/12 1514      Chief Complaint  Patient presents with  . Chest Pain    (Consider location/radiation/quality/duration/timing/severity/associated sxs/prior treatment) HPI  Cardiologist: Dr. Myrtis Ser with [facility] PCP: Felicity Coyer, V   Pt to the ER for left leg pain and chest tightness. Pt woke up this morning feeling "unwell" with chest  tightness. She says the pain went away and then came back around 12 lunch time. She describes it  starting under her left breast and then spreading. The left upper leg pain started around the same time as  well and has been constant. She describes it as a cramping and denies any injury or feeling this type of  pain in her leg before. She is still having the pain now. She denies feeling SOB. Pt is crying in exam room.  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 She says her sugars have been under control. She had spinal infections  done 3 weeks ago and back surgery 1 year ago.   Past Medical History  Diagnosis Date  . Sleep apnea     noncompliant w/ CPAP  . Hypertension   . Hyperlipidemia   . Chronic headache   . Fibromyalgia     daily narcotics  . Anxiety     hx chronic BZ use, stopped 07/2010  . Anemia   . Pulmonary sarcoidosis     unimpressive CT chest 2011  . Colonic polyp   . GERD (gastroesophageal reflux disease)   . ALLERGIC RHINITIS   . Asthma   . CHF (congestive heart failure)     Diastolic with fluid overload, May, 2012, LVEF 60%  . Morbid obesity   . Depression   . Panic attacks   . Diabetes mellitus   . COPD (chronic obstructive pulmonary disease)     on home O2, moderate airflow obstruction, suspect d/t emphysema  . Obesity   . Elevated LFTs 09/2011  . Ovarian cyst     Past Surgical History  Procedure Date  .  Polypectomy 2011  . Lumbar microdiscectomy 07/06/2011    R L4-5, stern  . Back surgery   . Carpal tunnel release   . Steroid spinal injections     Family History  Problem Relation Age of Onset  . Hypertension Mother   . Emphysema Father   . Hypertension Father   . Stomach cancer Father   . Allergies Brother   . Hypertension Brother   . Stomach cancer Brother   . Heart disease Mother   . Heart disease Father   . Heart disease Brother     History  Substance Use Topics  . Smoking status: Current Every Day Smoker -- 1.0 packs/day for 29 years    Types: Cigarettes    Last Attempt to Quit: 09/14/2010  . Smokeless tobacco: Never Used     Comment: Resumed smoking September 2013  . Alcohol Use: Yes     Comment: occasionally    OB History    Grav Para Term Preterm Abortions TAB SAB Ect Mult Living                  Review of Systems  Review of Systems  Gen: no weight loss, fevers, chills, night sweats  Eyes: no discharge  or drainage, no occular pain or visual changes  Nose: no epistaxis or rhinorrhea  Mouth: no dental pain, no sore throat  Neck: no neck pain  Lungs:No wheezing, coughing or hemoptysis CV:, palpitations, dependent edema or orthopnea,  + chest pain Abd: no abdominal pain, nausea, vomiting  GU: no dysuria or gross hematuria  MSK:  Left leg pain  Neuro: no headache, no focal neurologic deficits  Skin: no abnormalities Psyche: negative.   Allergies  Ciprofloxacin; Latex; Sulfamethoxazole w-trimethoprim; Azithromycin; Doxycycline; Metformin and related; and Metronidazole  Home Medications   Current Outpatient Rx  Name  Route  Sig  Dispense  Refill  . ALBUTEROL SULFATE HFA 108 (90 BASE) MCG/ACT IN AERS   Inhalation   Inhale 2 puffs into the lungs every 6 (six) hours as needed. For wheeze or shortness of breath   1 Inhaler   0   . ALBUTEROL SULFATE (2.5 MG/3ML) 0.083% IN NEBU   Nebulization   Take 2.5 mg by nebulization every 6 (six) hours as  needed. For wheeze or shortness of breath         . ALPRAZOLAM 1 MG PO TABS   Oral   Take 1 tablet (1 mg total) by mouth 2 (two) times daily as needed. anxiety   60 tablet   3   . ASPIRIN 81 MG PO TBEC   Oral   Take 81 mg by mouth daily.         . BUDESONIDE-FORMOTEROL FUMARATE 160-4.5 MCG/ACT IN AERO   Inhalation   Inhale 2 puffs into the lungs 2 (two) times daily.   1 Inhaler   2   . CETIRIZINE HCL 10 MG PO TABS   Oral   Take 10 mg by mouth at bedtime.         Marland Kitchen ESOMEPRAZOLE MAGNESIUM 40 MG PO CPDR   Oral   Take 40 mg by mouth 2 (two) times daily.         Marland Kitchen GLIMEPIRIDE 1 MG PO TABS   Oral   Take 1 mg by mouth daily before breakfast.         . IBUPROFEN 600 MG PO TABS   Oral   Take 1 tablet (600 mg total) by mouth every 6 (six) hours as needed for pain.   30 tablet   0   . INSULIN GLARGINE 100 UNIT/ML [place] SOLN   Subcutaneous   Inject 8-16 Units into the skin at bedtime. If blood sugar is over 100, patient uses 16 units. If under 100, patient uses 8 units         . MOMETASONE FUROATE 50 MCG/ACT NA SUSP   Nasal   Place 2 sprays into the nose daily.         . OXYCODONE HCL 10 MG PO TABS   Oral   Take 5-10 mg by mouth 3 (three) times daily as needed. For pain; Do not fill before 26th of month         . POTASSIUM CHLORIDE CRYS ER 20 MEQ PO TBCR   Oral   Take 20 mEq by mouth 2 (two) times daily.          . SERTRALINE HCL 25 MG PO TABS   Oral   Take 25 mg by mouth daily.         Marland Kitchen SPIRONOLACTONE 25 MG PO TABS   Oral   Take 25 mg by mouth every morning. Take 1 by mouth daily         .  TIOTROPIUM BROMIDE MONOHYDRATE 18 MCG IN CAPS   Inhalation   Place 18 mcg into inhaler and inhale daily.         . TORSEMIDE 100 MG PO TABS   Oral   Take 1 tablet (100 mg total) by mouth 2 (two) times daily.   60 tablet   5     BP 119/70  Pulse 94  Temp 98 F (36.7 C) (Oral)  Resp 13  SpO2 96%  LMP 12/28/2010  Physical Exam  Nursing note  and vitals reviewed. Constitutional: She appears well-developed and well-nourished. Distressed: pt crying during exam.  HENT:  Head: Normocephalic and atraumatic.  Eyes: Pupils are equal, round, and reactive to light.  Neck: Normal range of motion. Neck supple.  Cardiovascular: Normal rate and regular rhythm.   Pulmonary/Chest: Effort normal. She has no wheezes. She exhibits no tenderness.  Abdominal: Soft.  Musculoskeletal:       Left upper leg: She exhibits tenderness. She exhibits no bony tenderness, no swelling, no edema, no deformity and no laceration.       Legs: Neurological: She is alert.  Skin: Skin is warm and dry.    ED Course  Procedures (including critical care time)  Labs Reviewed  CBC WITH DIFFERENTIAL - Abnormal; Notable for the following:    WBC 16.0 (*)     RBC 6.23 (*)     Hemoglobin 16.6 (*)     HCT 49.0 (*)     RDW 18.3 (*)     Neutro Abs 11.8 (*)     Monocytes Absolute 1.1 (*)     All other components within normal limits  COMPREHENSIVE METABOLIC PANEL - Abnormal; Notable for the following:    Chloride 92 (*)     Glucose, Bld 102 (*)     ALT 55 (*)     GFR calc non Af Amer 72 (*)     GFR calc Af Amer 84 (*)     All other components within normal limits  TROPONIN I  PRO B NATRIURETIC PEPTIDE  LIPASE, BLOOD  TROPONIN I   Dg Chest 2 View  09/24/2012  *RADIOLOGY REPORT*  Clinical Data: Chest pain  CHEST - 2 VIEW  Comparison: None  Findings: Heart size is normal.  There is no pleural effusion or edema.  No airspace consolidation. Chronic interstitial coarsening with superimposed bilateral lower lobe linear opacities noted. Visualized osseous structures are unremarkable.  IMPRESSION:  1.  Bilateral lower lobe scar versus plate-like atelectasis. 2.  Chronic appearing interstitial coarsening.   Original Report Authenticated By: Signa Kell, M.D.      1. Anxiety   2. Panic attack   3. Stress   4. Chronic back pain   5. Type 2 diabetes mellitus   6.  Fibromyalgia       MDM    Date: 09/24/2012  Rate: 101  Rhythm: sinus tachycardia  QRS Axis: normal  Intervals: normal  ST/T Wave abnormalities: nonspecific T wave changes  Conduction Disutrbances:baseline wander in leads II, III, aVF  Narrative Interpretation:   Old EKG Reviewed: none available      Author:  Smiley Houseman  Service:  Vascular Lab  Author Type:  Cardiovascular Sonographer   Filed:  09/24/12 1635  Note Time:  09/24/12 1634          Left: No evidence of DVT, superficial thrombosis, or Baker's cyst. Right: Negative for DVT in the common femoral vein.     Pt negative for DVT. PE  is very unlikely. Ativan 0.5mg  helped patient significantly. After all the labs have come back normal, I discussed causes of pain with the patient. She admits that she has been more stressed than normal. With a history of anxiety and panic attacks she got a new puppy who has been "really annoying her because it whines all the time". She requests resources for "mental health".  Discussed with [facility]. They will bring her resources.  Discussed case with DR. Ghim who agrees with my work-up, findings and plan.  Pt has been advised of the symptoms that warrant their return to the ED. Patient has voiced understanding and has agreed to follow-up with the PCP or specialist.       Dorthula Matas, PA 09/24/12 Ernestina [place]

## 2012-09-24 NOTE — ED Notes (Signed)
Pt is here from home with c/o chest pressure that started today along with n/v/d

## 2012-09-24 NOTE — ED Notes (Signed)
Pt discharged.Vital signs stable and GCS 15.No Pt denies any self harm or harm to others.

## 2012-09-24 NOTE — Progress Notes (Signed)
Left:  No evidence of DVT, superficial thrombosis, or Baker's cyst.  Right:  Negative for DVT in the common femoral vein.  

## 2012-09-24 NOTE — ED Notes (Signed)
Pt received 325 mg asa per EMS and 1 nitro that made her pain/pressure from a 8 to a 5

## 2012-09-26 ENCOUNTER — Ambulatory Visit: Payer: Medicare Other | Admitting: Adult Health

## 2012-10-02 ENCOUNTER — Encounter: Payer: Self-pay | Admitting: Cardiology

## 2012-10-07 ENCOUNTER — Telehealth: Payer: Self-pay | Admitting: Internal Medicine

## 2012-10-07 NOTE — Telephone Encounter (Signed)
Patient is having oral surgery in the morning and she is calling to make sure we don't have any special instructions for her

## 2012-10-07 NOTE — Telephone Encounter (Signed)
No special instructions - thanks

## 2012-10-07 NOTE — Telephone Encounter (Signed)
Notified pt with md response.../lm,b 

## 2012-10-10 ENCOUNTER — Other Ambulatory Visit: Payer: Self-pay | Admitting: Internal Medicine

## 2012-10-14 ENCOUNTER — Ambulatory Visit: Payer: Medicare Other | Admitting: Pulmonary Disease

## 2012-10-14 ENCOUNTER — Other Ambulatory Visit: Payer: Self-pay | Admitting: Internal Medicine

## 2012-10-17 ENCOUNTER — Encounter (HOSPITAL_COMMUNITY): Payer: Self-pay | Admitting: *Deleted

## 2012-10-17 ENCOUNTER — Telehealth: Payer: Self-pay | Admitting: Internal Medicine

## 2012-10-17 ENCOUNTER — Emergency Department (HOSPITAL_COMMUNITY)
Admission: EM | Admit: 2012-10-17 | Discharge: 2012-10-17 | Discharge status: Against Medical Advice | Payer: Medicare Other | Attending: Emergency Medicine | Admitting: Emergency Medicine

## 2012-10-17 ENCOUNTER — Emergency Department (HOSPITAL_COMMUNITY): Payer: Medicare Other

## 2012-10-17 DIAGNOSIS — Z8739 Personal history of other diseases of the musculoskeletal system and connective tissue: Secondary | ICD-10-CM | POA: Insufficient documentation

## 2012-10-17 DIAGNOSIS — G4730 Sleep apnea, unspecified: Secondary | ICD-10-CM | POA: Insufficient documentation

## 2012-10-17 DIAGNOSIS — Z3202 Encounter for pregnancy test, result negative: Secondary | ICD-10-CM | POA: Insufficient documentation

## 2012-10-17 DIAGNOSIS — F411 Generalized anxiety disorder: Secondary | ICD-10-CM | POA: Insufficient documentation

## 2012-10-17 DIAGNOSIS — N898 Other specified noninflammatory disorders of vagina: Secondary | ICD-10-CM | POA: Insufficient documentation

## 2012-10-17 DIAGNOSIS — Z860100 Personal history of colon polyps, unspecified: Secondary | ICD-10-CM | POA: Insufficient documentation

## 2012-10-17 DIAGNOSIS — J449 Chronic obstructive pulmonary disease, unspecified: Secondary | ICD-10-CM | POA: Insufficient documentation

## 2012-10-17 DIAGNOSIS — F17200 Nicotine dependence, unspecified, uncomplicated: Secondary | ICD-10-CM | POA: Insufficient documentation

## 2012-10-17 DIAGNOSIS — Z862 Personal history of diseases of the blood and blood-forming organs and certain disorders involving the immune mechanism: Secondary | ICD-10-CM | POA: Insufficient documentation

## 2012-10-17 DIAGNOSIS — E119 Type 2 diabetes mellitus without complications: Secondary | ICD-10-CM | POA: Insufficient documentation

## 2012-10-17 DIAGNOSIS — Z8669 Personal history of other diseases of the nervous system and sense organs: Secondary | ICD-10-CM | POA: Insufficient documentation

## 2012-10-17 DIAGNOSIS — Z8742 Personal history of other diseases of the female genital tract: Secondary | ICD-10-CM | POA: Insufficient documentation

## 2012-10-17 DIAGNOSIS — Z8639 Personal history of other endocrine, nutritional and metabolic disease: Secondary | ICD-10-CM | POA: Insufficient documentation

## 2012-10-17 DIAGNOSIS — K219 Gastro-esophageal reflux disease without esophagitis: Secondary | ICD-10-CM | POA: Insufficient documentation

## 2012-10-17 DIAGNOSIS — J4489 Other specified chronic obstructive pulmonary disease: Secondary | ICD-10-CM | POA: Insufficient documentation

## 2012-10-17 DIAGNOSIS — Z7982 Long term (current) use of aspirin: Secondary | ICD-10-CM | POA: Insufficient documentation

## 2012-10-17 DIAGNOSIS — N939 Abnormal uterine and vaginal bleeding, unspecified: Secondary | ICD-10-CM

## 2012-10-17 DIAGNOSIS — Z79899 Other long term (current) drug therapy: Secondary | ICD-10-CM | POA: Insufficient documentation

## 2012-10-17 DIAGNOSIS — Z8709 Personal history of other diseases of the respiratory system: Secondary | ICD-10-CM | POA: Insufficient documentation

## 2012-10-17 DIAGNOSIS — I10 Essential (primary) hypertension: Secondary | ICD-10-CM | POA: Insufficient documentation

## 2012-10-17 DIAGNOSIS — I5030 Unspecified diastolic (congestive) heart failure: Secondary | ICD-10-CM | POA: Insufficient documentation

## 2012-10-17 DIAGNOSIS — N949 Unspecified condition associated with female genital organs and menstrual cycle: Secondary | ICD-10-CM | POA: Insufficient documentation

## 2012-10-17 DIAGNOSIS — J45909 Unspecified asthma, uncomplicated: Secondary | ICD-10-CM | POA: Insufficient documentation

## 2012-10-17 DIAGNOSIS — IMO0002 Reserved for concepts with insufficient information to code with codable children: Secondary | ICD-10-CM | POA: Insufficient documentation

## 2012-10-17 DIAGNOSIS — Z794 Long term (current) use of insulin: Secondary | ICD-10-CM | POA: Insufficient documentation

## 2012-10-17 HISTORY — DX: Other chronic pain: G89.29

## 2012-10-17 HISTORY — DX: Dorsalgia, unspecified: M54.9

## 2012-10-17 LAB — BASIC METABOLIC PANEL
Chloride: 98 mEq/L (ref 96–112)
Creatinine, Ser: 0.83 mg/dL (ref 0.50–1.10)
GFR calc Af Amer: >90 mL/min (ref >90)
Potassium: 4.2 mEq/L (ref 3.5–5.1)
Sodium: 136 mEq/L (ref 135–145)

## 2012-10-17 LAB — CBC WITH DIFFERENTIAL/PLATELET
Basophils Absolute: 0.1 10*3/uL (ref 0.0–0.1)
Basophils Relative: 1 % (ref 0–1)
MCHC: 32.2 g/dL (ref 30.0–36.0)
Neutro Abs: 6.5 10*3/uL (ref 1.7–7.7)
Neutrophils Relative %: 72 % (ref 43–77)
Platelets: 213 10*3/uL (ref 150–400)
RDW: 18.5 % — ABNORMAL HIGH (ref 11.5–15.5)
WBC: 9 10*3/uL (ref 4.0–10.5)

## 2012-10-17 LAB — URINALYSIS, ROUTINE W REFLEX MICROSCOPIC
Bilirubin Urine: NEGATIVE
Ketones, ur: NEGATIVE mg/dL
Leukocytes, UA: NEGATIVE
Nitrite: NEGATIVE
Protein, ur: NEGATIVE mg/dL

## 2012-10-17 LAB — PREGNANCY, URINE: Preg Test, Ur: NEGATIVE

## 2012-10-17 LAB — URINE MICROSCOPIC-ADD ON

## 2012-10-17 MED ORDER — HYDROMORPHONE HCL PF 2 MG/ML IJ SOLN
2.0000 mg | Freq: Once | INTRAMUSCULAR | Status: AC
  Administered 2012-10-17: 2 mg via INTRAMUSCULAR
  Filled 2012-10-17: qty 1

## 2012-10-17 NOTE — ED Provider Notes (Signed)
History     CSN: [id number]  Arrival date & time 10/17/12  1728   First MD Initiated Contact with Patient 10/17/12 2012      Chief Complaint  Patient presents with  . Vaginal Bleeding     HPI Pt was seen at 2035.   Per pt, c/o gradual onset and persistence of constant vaginal bleeding since yesterday.  States she began to "spot" after having intercourse yesterday.  States today she has developed lower pelvic "cramping" pain and vaginal bleeding "with clots" like a period.  LMP approx 2 years ago.  States her PMD has set her up with an OB/GYN for eval but she "can't wait for that."  Denies N/V/D, no CP/SOB, no dysuria, no vaginal discharge, no back pain, no fevers.     Past Medical History  Diagnosis Date  . Sleep apnea     noncompliant w/ CPAP  . Hypertension   . Hyperlipidemia   . Chronic headache   . Fibromyalgia     daily narcotics  . Anxiety     hx chronic BZ use, stopped 07/2010  . Anemia   . Pulmonary sarcoidosis     unimpressive CT chest 2011  . Colonic polyp   . GERD (gastroesophageal reflux disease)   . ALLERGIC RHINITIS   . Asthma   . CHF (congestive heart failure)     Diastolic with fluid overload, May, 2012, LVEF 60%  . Morbid obesity   . Depression   . Panic attacks   . Diabetes mellitus   . COPD (chronic obstructive pulmonary disease)     on home O2, moderate airflow obstruction, suspect d/t emphysema  . Obesity   . Elevated LFTs 09/2011  . Ovarian cyst   . Chronic back pain     Past Surgical History  Procedure Date  . Polypectomy 2011  . Lumbar microdiscectomy 07/06/2011    R L4-5, stern  . Back surgery   . Carpal tunnel release   . Steroid spinal injections     Family History  Problem Relation Age of Onset  . Hypertension Mother   . Emphysema Father   . Hypertension Father   . Stomach cancer Father   . Allergies Brother   . Hypertension Brother   . Stomach cancer Brother   . Heart disease Mother   . Heart disease Father   . Heart  disease Brother     History  Substance Use Topics  . Smoking status: Current Every Day Smoker -- 1.0 packs/day for 29 years    Types: Cigarettes    Last Attempt to Quit: 09/14/2010  . Smokeless tobacco: Never Used     Comment: Resumed smoking September 2013  . Alcohol Use: Yes     Comment: occasionally    Review of Systems ROS: Statement: All systems negative except as marked or noted in the HPI; Constitutional: Negative for fever and chills. ; ; Eyes: Negative for eye pain, redness and discharge. ; ; ENMT: Negative for ear pain, hoarseness, nasal congestion, sinus pressure and sore throat. ; ; Cardiovascular: Negative for chest pain, palpitations, diaphoresis, dyspnea and peripheral edema. ; ; Respiratory: Negative for cough, wheezing and stridor. ; ; Gastrointestinal: Negative for nausea, vomiting, diarrhea, abdominal pain, blood in stool, hematemesis, jaundice and rectal bleeding. . ; ; Genitourinary: Negative for dysuria, flank pain and hematuria. ; ; GYN:  +pelvic cramping, +vaginal bleeding, no vaginal discharge, no vulvar pain.;;Musculoskeletal: Negative for back pain and neck pain. Negative for swelling and trauma.; ;  Skin: Negative for pruritus, rash, abrasions, blisters, bruising and skin lesion.; ; Neuro: Negative for headache, lightheadedness and neck stiffness. Negative for weakness, altered level of consciousness , altered mental status, extremity weakness, paresthesias, involuntary movement, seizure and syncope.       Allergies  Ciprofloxacin; Latex; Sulfamethoxazole w-trimethoprim; Azithromycin; Doxycycline; Metformin and related; and Metronidazole  Home Medications   Current Outpatient Rx  Name  Route  Sig  Dispense  Refill  . ALBUTEROL SULFATE HFA 108 (90 BASE) MCG/ACT IN AERS   Inhalation   Inhale 2 puffs into the lungs every 6 (six) hours as needed. For wheeze or shortness of breath   1 Inhaler   0   . ALBUTEROL SULFATE (2.5 MG/3ML) 0.083% IN NEBU    Nebulization   Take 2.5 mg by nebulization every 6 (six) hours as needed. For wheeze or shortness of breath         . ALPRAZOLAM 1 MG PO TABS   Oral   Take 1 tablet (1 mg total) by mouth 2 (two) times daily as needed. anxiety   60 tablet   3   . ASPIRIN 81 MG PO TBEC   Oral   Take 81 mg by mouth daily.         . BUDESONIDE-FORMOTEROL FUMARATE 160-4.5 MCG/ACT IN AERO   Inhalation   Inhale 2 puffs into the lungs 2 (two) times daily.   1 Inhaler   2   . CETIRIZINE HCL 10 MG PO TABS   Oral   Take 10 mg by mouth at bedtime.         Marland Kitchen ESOMEPRAZOLE MAGNESIUM 40 MG PO CPDR   Oral   Take 40 mg by mouth 2 (two) times daily.         Marland Kitchen GLIMEPIRIDE 1 MG PO TABS   Oral   Take 1 mg by mouth daily before breakfast.         . IBUPROFEN 600 MG PO TABS   Oral   Take 1 tablet (600 mg total) by mouth every 6 (six) hours as needed for pain.   30 tablet   0   . INSULIN GLARGINE 100 UNIT/ML [place] SOLN   Subcutaneous   Inject 8-16 Units into the skin at bedtime. If blood sugar is over 100, patient uses 16 units. If under 100, patient uses 8 units         . MOMETASONE FUROATE 50 MCG/ACT NA SUSP   Nasal   Place 2 sprays into the nose daily.         . OXYCODONE HCL 10 MG PO TABS   Oral   Take 5-10 mg by mouth 3 (three) times daily as needed. For pain; Do not fill before 26th of month         . POTASSIUM CHLORIDE CRYS ER 20 MEQ PO TBCR   Oral   Take 20 mEq by mouth 2 (two) times daily.          . SERTRALINE HCL 25 MG PO TABS   Oral   Take 25 mg by mouth daily.         Marland Kitchen SPIRONOLACTONE 25 MG PO TABS      TAKE 1 TABLET BY MOUTH DAILY   30 tablet   5   . TIOTROPIUM BROMIDE MONOHYDRATE 18 MCG IN CAPS   Inhalation   Place 18 mcg into inhaler and inhale daily.         . TORSEMIDE 100 MG PO TABS  Oral   Take 1 tablet (100 mg total) by mouth 2 (two) times daily.   60 tablet   5     BP 123/76  Pulse 101  Temp 98.2 F (36.8 C) (Oral)  Resp 20  SpO2 90%   LMP 12/28/2010  Physical Exam 2040: Physical examination:  Nursing notes reviewed; Vital signs and O2 SAT reviewed;  Constitutional: Well developed, Well nourished, Well hydrated, In no acute distress; Head:  Normocephalic, atraumatic; Eyes: EOMI, PERRL, No scleral icterus; ENMT: Mouth and pharynx normal, Mucous membranes moist; Neck: Supple, Full range of motion, No lymphadenopathy; Cardiovascular: Regular rate and rhythm, No murmur, rub, or gallop; Respiratory: Breath sounds clear & equal bilaterally, No rales, rhonchi, wheezes.  Speaking full sentences with ease, Normal respiratory effort/excursion; Chest: Nontender, Movement normal; Abdomen: Soft, Obese. Nontender. Normal bowel sounds; Genitourinary: No CVA tenderness; Extremities: Pulses normal, No tenderness, No edema, No calf edema or asymmetry.; Neuro: AA&Ox3, Major CN grossly intact.  Speech clear. Climbs on and off stretcher easily by herself. Gait steady. No gross focal motor or sensory deficits in extremities.; Skin: Color normal, Warm, Dry.   ED Course  Procedures    MDM  MDM Reviewed: nursing note, vitals and previous chart Interpretation: labs   Results for orders placed during the hospital encounter of 10/17/12  CBC WITH DIFFERENTIAL      Component Value Range   WBC 9.0  4.0 - 10.5 K/uL   RBC 5.13 (*) 3.87 - 5.11 MIL/uL   Hemoglobin 13.7  12.0 - 15.0 g/dL   HCT 16.1  09.6 - 04.5 %   MCV 83.0  78.0 - 100.0 fL   MCH 26.7  26.0 - 34.0 pg   MCHC 32.2  30.0 - 36.0 g/dL   RDW 40.9 (*) 81.1 - 91.4 %   Platelets 213  150 - 400 K/uL   Neutrophils Relative 72  43 - 77 %   Neutro Abs 6.5  1.7 - 7.7 K/uL   Lymphocytes Relative 19  12 - 46 %   Lymphs Abs 1.7  0.7 - 4.0 K/uL   Monocytes Relative 6  3 - 12 %   Monocytes Absolute 0.5  0.1 - 1.0 K/uL   Eosinophils Relative 3  0 - 5 %   Eosinophils Absolute 0.2  0.0 - 0.7 K/uL   Basophils Relative 1  0 - 1 %   Basophils Absolute 0.1  0.0 - 0.1 K/uL  BASIC METABOLIC PANEL       Component Value Range   Sodium 136  135 - 145 mEq/L   Potassium 4.2  3.5 - 5.1 mEq/L   Chloride 98  96 - 112 mEq/L   CO2 29  19 - 32 mEq/L   Glucose, Bld 119 (*) 70 - 99 mg/dL   BUN 8  6 - 23 mg/dL   Creatinine, Ser 7.82  0.50 - 1.10 mg/dL   Calcium 9.1  8.4 - 95.6 mg/dL   GFR calc non Af Amer 84 (*) >90 mL/min   GFR calc Af Amer >90  >90 mL/min  PREGNANCY, URINE      Component Value Range   Preg Test, Ur NEGATIVE  NEGATIVE  URINALYSIS, ROUTINE W REFLEX MICROSCOPIC      Component Value Range   Color, Urine YELLOW  YELLOW   APPearance CLEAR  CLEAR   Specific Gravity, Urine 1.020  1.005 - 1.030   pH 6.0  5.0 - 8.0   Glucose, UA NEGATIVE  NEGATIVE mg/dL  Hgb urine dipstick LARGE (*) NEGATIVE   Bilirubin Urine NEGATIVE  NEGATIVE   Ketones, ur NEGATIVE  NEGATIVE mg/dL   Protein, ur NEGATIVE  NEGATIVE mg/dL   Urobilinogen, UA 1.0  0.0 - 1.0 mg/dL   Nitrite NEGATIVE  NEGATIVE   Leukocytes, UA NEGATIVE  NEGATIVE  URINE MICROSCOPIC-ADD ON      Component Value Range   Squamous Epithelial / LPF RARE  RARE   WBC, UA 0-2  <3 WBC/hpf   RBC / HPF TOO NUMEROUS TO COUNT  <3 RBC/hpf   Bacteria, UA RARE  RARE     2250:  Pt refuses to stay any longer for [place] or pelvic exam and wants to go home now.  ED staff encouraged pt to stay, continues to refuse.  Pt makes her own medical decisions.  Risks of AMA explained to pt, including, but not limited to:  GYN pathology, vaginal infection, stroke, heart attack, cardiac arrythmia ("irregular heart rate/beat"), "passing out," temporary and/or permanent disability, death.  Pt verb understanding and continue to refuse to stay for [place] or pelvic exam, understanding the consequences of her decision.  I encouraged pt to follow up with her PMD and OB/GYN tomorrow and return to the ED immediately if symptoms worsen, or for any other concerns.  Pt verb understanding, agreeable.             Laray Anger, DO 10/18/12 1658

## 2012-10-17 NOTE — ED Notes (Signed)
Pt reports not having a period for 2 years and yesterday she began having abd cramping and bleeding. Dr is setting up referral for GYN but pain became intolerable for pt. Reports using one pad every couple of hours.

## 2012-10-17 NOTE — ED Notes (Signed)
Spoke with radiology, stated they will check on time for [place]

## 2012-10-17 NOTE — Telephone Encounter (Signed)
Message copied by Arna Snipe on Thu Oct 17, 2012  4:13 PM ------      Message from: Richardson Chiquito      Created: Wed Aug 21, 2012 12:45 PM                   ----- Message -----         From: Hart Carwin, MD         Sent: 08/21/2012  12:26 PM           To: Richardson Chiquito, CMA            Yes            ----- Message -----         From: Richardson Chiquito, CMA         Sent: 08/21/2012  11:17 AM           To: Hart Carwin, MD            Patient no showed appointment with Dr Juanda Chance on 08/21/12. Dr Juanda Chance, do you want to charge no show fee?

## 2012-10-17 NOTE — ED Notes (Signed)
Pt called out requesting to leave AMA, because her ride is here and has to be at work in an hour.  Spoke with EDP at pt's request and stated that pt can leave AMA.  [place] is here at this time and pt stated that she will stay for [place] at this time.

## 2012-10-18 ENCOUNTER — Encounter: Payer: Self-pay | Admitting: Internal Medicine

## 2012-10-18 ENCOUNTER — Ambulatory Visit (INDEPENDENT_AMBULATORY_CARE_PROVIDER_SITE_OTHER): Payer: Medicare Other | Admitting: Internal Medicine

## 2012-10-18 VITALS — BP 122/78 | HR 95 | Temp 97.9°F | Ht 68.5 in | Wt 303.0 lb

## 2012-10-18 DIAGNOSIS — Z23 Encounter for immunization: Secondary | ICD-10-CM

## 2012-10-18 DIAGNOSIS — E1165 Type 2 diabetes mellitus with hyperglycemia: Secondary | ICD-10-CM

## 2012-10-18 DIAGNOSIS — E785 Hyperlipidemia, unspecified: Secondary | ICD-10-CM

## 2012-10-18 DIAGNOSIS — Z0000 Encounter for general adult medical examination without abnormal findings: Secondary | ICD-10-CM

## 2012-10-18 DIAGNOSIS — N926 Irregular menstruation, unspecified: Secondary | ICD-10-CM

## 2012-10-18 MED ORDER — OXYCODONE HCL 10 MG PO TABS: 5.0000 mg | ORAL_TABLET | Freq: Three times a day (TID) | ORAL | Status: DC | PRN

## 2012-10-18 MED ORDER — ATORVASTATIN CALCIUM 10 MG PO TABS: 10.0000 mg | ORAL_TABLET | Freq: Every day | ORAL | Status: DC

## 2012-10-18 NOTE — Assessment & Plan Note (Signed)
Remote simva poorly tolerated (leg cramps), not on meds in past few years Reviewed last lipids Start low dose atorva now

## 2012-10-18 NOTE — Assessment & Plan Note (Signed)
New dx, clarified 11/2011 - exacerbated by frequent pred use Intol of metformin due to severe diarrhea side effects  On Lantus pen qhs and Amaryl in AM - titrate as needed recheck a1c q 3 mo Lab Results  Component Value Date   HGBA1C 5.9* 09/01/2012

## 2012-10-18 NOTE — Progress Notes (Signed)
Subjective:    [name], female    DOB: [birth year], 47 y.o.   MRN: [id number]  HPI  Here for medicare wellness  Diet: heart healthy, DM if diabetic Physical activity: sedentary Depression/mood screen: negative Hearing: intact to whispered voice Visual acuity: grossly normal, performs annual eye exam  ADLs: capable Fall risk: none Home safety: good Cognitive evaluation: intact to orientation, naming, recall and repetition EOL planning: adv directives, full code/ I agree  I have personally reviewed and have noted 1. The patient's medical and social history 2. Their use of alcohol, tobacco or illicit drugs 3. Their current medications and supplements 4. The patient's functional ability including ADL's, fall risks, home safety risks and hearing or visual impairment. 5. Diet and physical activities 6. Evidence for depression or mood disorders  Also reviewed : In ER last PM for vaginal bleeding and menstrual cramps - pending gyn eval in 72h  Past Medical History  Diagnosis Date  . Sleep apnea     noncompliant w/ CPAP  . Hypertension   . Hyperlipidemia   . Chronic headache   . Fibromyalgia     daily narcotics  . Anxiety     hx chronic BZ use, stopped 07/2010  . Anemia   . Pulmonary sarcoidosis     unimpressive CT chest 2011  . Colonic polyp   . GERD (gastroesophageal reflux disease)   . ALLERGIC RHINITIS   . Asthma   . CHF (congestive heart failure)     Diastolic with fluid overload, May, 2012, LVEF 60%  . Morbid obesity   . Depression   . Panic attacks   . Diabetes mellitus   . COPD (chronic obstructive pulmonary disease)     on home O2, moderate airflow obstruction, suspect d/t emphysema  . Obesity   . Elevated LFTs 09/2011  . Ovarian cyst   . Chronic back pain    Family History  Problem Relation Age of Onset  . Hypertension Mother   . Emphysema Father   . Hypertension Father   . Stomach cancer Father   . Allergies Brother   .  Hypertension Brother   . Stomach cancer Brother   . Heart disease Mother   . Heart disease Father   . Heart disease Brother    History  Substance Use Topics  . Smoking status: Current Every Day Smoker -- 1.0 packs/day for 29 years    Types: Cigarettes    Last Attempt to Quit: 09/14/2010  . Smokeless tobacco: Never Used     Comment: Resumed smoking September 2013  . Alcohol Use: Yes     Comment: occasionally   Review of Systems Constitutional: Negative for fever or unexpected weight change.  Respiratory: Negative for cough - chronic dyspnea on exertion and shortness of breath.   Cardiovascular: Negative for chest pain or palpitations.  Gastrointestinal: Negative for abdominal pain, no bowel changes.  Musculoskeletal: Negative for gait problem or joint swelling.  Skin: Negative for rash.  Neurological: Negative for dizziness or headache.  No other specific complaints in a complete review of systems (except as listed in HPI above).     Objective:   Physical Exam BP 122/78  Pulse 95  Temp 97.9 F (36.6 C) (Oral)  Ht 5' 8.5" (1.74 m)  Wt 303 lb (137.44 kg)  BMI 45.40 kg/m2  SpO2 94%  LMP 12/28/2010 Wt Readings from Last 3 Encounters:  10/18/12 303 lb (137.44 kg)  09/16/12 283 lb (128.368 kg)  09/01/12 282 lb (  127.914 kg)   Constitutional: She is overweight, but appears well-developed and well-nourished. No distress.  HENT: Head: Normocephalic and atraumatic. Ears: B TMs ok, no erythema or effusion; Nose: Nose normal. Mouth/Throat: Oropharynx is clear and moist. No oropharyngeal exudate.  Eyes: Conjunctivae and EOM are normal. Pupils are equal, round, and reactive to light. No scleral icterus.  Neck: Normal range of motion. Neck supple. No JVD present. No thyromegaly present.  Cardiovascular: Normal rate, regular rhythm and normal heart sounds.  No murmur heard. No BLE edema. Pulmonary/Chest: Effort normal and breath sounds diminished at bases. No respiratory distress. She  has no wheezes.  Abdominal: Soft. Bowel sounds are normal. She exhibits no distension. There is no tenderness. no masses GU: defer to gyn Neurological: She is alert and oriented to person, place, and time. No cranial nerve deficit. Coordination normal.  Skin: Skin is warm and dry. No rash noted. No erythema.  Psychiatric: She has a normal mood and affect. Her behavior is normal. Judgment and thought content normal.   Lab Results  Component Value Date   WBC 9.0 10/17/2012   HGB 13.7 10/17/2012   HCT 42.6 10/17/2012   PLT 213 10/17/2012   GLUCOSE 119* 10/17/2012   CHOL 255* 02/13/2012   TRIG 133.0 02/13/2012   HDL 54.30 02/13/2012   LDLDIRECT 186.4 02/13/2012   LDLCALC  Value: 92        03/07/2010   ALT 55* 09/24/2012   AST 29 09/24/2012   NA 136 10/17/2012   K 4.2 10/17/2012   CL 98 10/17/2012   CREATININE 0.83 10/17/2012   BUN 8 10/17/2012   CO2 29 10/17/2012   TSH 0.701 04/22/2012   HGBA1C 5.9* 09/01/2012       Assessment & Plan:   AWV/CPX/v70.0 - Today patient counseled on age appropriate routine health concerns for screening and prevention, each reviewed and up to date or declined. Immunizations reviewed and up to date or declined. Prior labs/ECG reviewed. Risk factors for depression reviewed and negative. Hearing function and visual acuity are intact. ADLs screened and addressed as needed. Functional ability and level of safety reviewed and appropriate. Education, counseling and referrals performed based on assessed risks today. Patient provided with a copy of personalized plan for preventive services.  New onset menses in past 24h, no period in past 2 years - follow up with gyn planned for Mon 10/21/12  Also See problem list. Medications and labs reviewed today.

## 2012-10-18 NOTE — Patient Instructions (Signed)
It was good to see you today. We have reviewed your prior records including labs and tests today Health Maintenance reviewed -tetanus and pertussis booster updated today - all other recommended immunizations and age-appropriate screenings are up-to-date. Start low-dose Lipitor for cholesterol as discussed, no other medication changes Your prescription(s) have been submitted to your pharmacy. Please take as directed and contact our office if you believe you are having problem(s) with the medication(s). Refill on oxycodone provided for you today as discussed Followup with other subspecialists including gynecology as scheduled Keep followup here for diabetes as scheduled, at least every 3 months Health Maintenance, Females A healthy lifestyle and preventative care can promote health and wellness.  Maintain regular health, dental, and eye exams.   Eat a healthy diet. Foods like vegetables, fruits, whole grains, low-fat dairy products, and lean protein foods contain the nutrients you need without too many calories. Decrease your intake of foods high in solid fats, added sugars, and salt. Get information about a proper diet from your caregiver, if necessary.   Regular physical exercise is one of the most important things you can do for your health. Most adults should get at least 150 minutes of moderate-intensity exercise (any activity that increases your heart rate and causes you to sweat) each week. In addition, most adults need muscle-strengthening exercises on 2 or more days a week.     Maintain a healthy weight. The body mass index (BMI) is a screening tool to identify possible weight problems. It provides an estimate of body fat based on height and weight. Your caregiver can help determine your BMI, and can help you achieve or maintain a healthy weight. For adults 20 years and older:   A BMI below 18.5 is considered underweight.   A BMI of 18.5 to 24.9 is normal.   A BMI of 25 to 29.9 is  considered overweight.   A BMI of 30 and above is considered obese.   Maintain normal blood lipids and cholesterol by exercising and minimizing your intake of saturated fat. Eat a balanced diet with plenty of fruits and vegetables. Blood tests for lipids and cholesterol should begin at age 65 and be repeated every 5 years. If your lipid or cholesterol levels are high, you are over 50, or you are a high risk for heart disease, you may need your cholesterol levels checked more frequently. Ongoing high lipid and cholesterol levels should be treated with medicines if diet and exercise are not effective.   If you smoke, find out from your caregiver how to quit. If you do not use tobacco, do not start.   If you are pregnant, do not drink alcohol. If you are breastfeeding, be very cautious about drinking alcohol. If you are not pregnant and choose to drink alcohol, do not exceed 1 drink per day. One drink is considered to be 12 ounces (355 mL) of beer, 5 ounces (148 mL) of wine, or 1.5 ounces (44 mL) of liquor.   Avoid use of street drugs. Do not share needles with anyone. Ask for help if you need support or instructions about stopping the use of drugs.   High blood pressure causes heart disease and increases the risk of stroke. Blood pressure should be checked at least every 1 to 2 years. Ongoing high blood pressure should be treated with medicines, if weight loss and exercise are not effective.   If you are 61 to 46 years old, ask your caregiver if you should take aspirin to prevent  strokes.   Diabetes screening involves taking a blood sample to check your fasting blood sugar level. This should be done once every 3 years, after age 37, if you are within normal weight and without risk factors for diabetes. Testing should be considered at a younger age or be carried out more frequently if you are overweight and have at least 1 risk factor for diabetes.   Breast cancer screening is essential preventative  care for women. You should practice "breast self-awareness." This means understanding the normal appearance and feel of your breasts and may include breast self-examination. Any changes detected, no matter how small, should be reported to a caregiver. Women in their 26s and 30s should have a clinical breast exam (CBE) by a caregiver as part of a regular health exam every 1 to 3 years. After age 36, women should have a CBE every year. Starting at age 38, women should consider having a mammogram (breast X-ray) every year. Women who have a family history of breast cancer should talk to their caregiver about genetic screening. Women at a high risk of breast cancer should talk to their caregiver about having an MRI and a mammogram every year.   The Pap test is a screening test for cervical cancer. Women should have a Pap test starting at age 12. Between ages 10 and 6, Pap tests should be repeated every 2 years. Beginning at age 21, you should have a Pap test every 3 years as long as the past 3 Pap tests have been normal. If you had a hysterectomy for a problem that was not cancer or a condition that could lead to cancer, then you no longer need Pap tests. If you are between ages 77 and 51, and you have had normal Pap tests going back 10 years, you no longer need Pap tests. If you have had past treatment for cervical cancer or a condition that could lead to cancer, you need Pap tests and screening for cancer for at least 20 years after your treatment. If Pap tests have been discontinued, risk factors (such as a new sexual partner) need to be reassessed to determine if screening should be resumed. Some women have medical problems that increase the chance of getting cervical cancer. In these cases, your caregiver may recommend more frequent screening and Pap tests.   The human papillomavirus (HPV) test is an additional test that may be used for cervical cancer screening. The HPV test looks for the virus that can cause  the cell changes on the cervix. The cells collected during the Pap test can be tested for HPV. The HPV test could be used to screen women aged 32 years and older, and should be used in women of any age who have unclear Pap test results. After the age of 38, women should have HPV testing at the same frequency as a Pap test.   Colorectal cancer can be detected and often prevented. Most routine colorectal cancer screening begins at the age of 99 and continues through age 38. However, your caregiver may recommend screening at an earlier age if you have risk factors for colon cancer. On a yearly basis, your caregiver may provide home test kits to check for hidden blood in the stool. Use of a small camera at the end of a tube, to directly examine the colon (sigmoidoscopy or colonoscopy), can detect the earliest forms of colorectal cancer. Talk to your caregiver about this at age 22, when routine screening begins. Direct examination of  the colon should be repeated every 5 to 10 years through age 14, unless early forms of pre-cancerous polyps or small growths are found.   Hepatitis C blood testing is recommended for all people born from [birth year] through 1964 and any individual with known risks for hepatitis C.   Practice safe sex. Use condoms and avoid high-risk sexual practices to reduce the spread of sexually transmitted infections (STIs). Sexually active women aged 2 and younger should be checked for Chlamydia, which is a common sexually transmitted infection. Older women with new or multiple partners should also be tested for Chlamydia. Testing for other STIs is recommended if you are sexually active and at increased risk.   Osteoporosis is a disease in which the bones lose minerals and strength with aging. This can result in serious bone fractures. The risk of osteoporosis can be identified using a bone density scan. Women ages 55 and over and women at risk for fractures or osteoporosis should discuss screening  with their caregivers. Ask your caregiver whether you should be taking a calcium supplement or vitamin D to reduce the rate of osteoporosis.   Menopause can be associated with physical symptoms and risks. Hormone replacement therapy is available to decrease symptoms and risks. You should talk to your caregiver about whether hormone replacement therapy is right for you.   Use sunscreen with a sun protection factor (SPF) of 30 or greater. Apply sunscreen liberally and repeatedly throughout the day. You should seek shade when your shadow is shorter than you. Protect yourself by wearing long sleeves, pants, a wide-brimmed hat, and sunglasses year round, whenever you are outdoors.   Notify your caregiver of new moles or changes in moles, especially if there is a change in shape or color. Also notify your caregiver if a mole is larger than the size of a pencil eraser.   Stay current with your immunizations.  Document Released: 03/27/2011 Document Revised: 12/04/2011 Document Reviewed: 03/27/2011 [facility] Patient Information 2013 [place], [place].

## 2012-10-21 ENCOUNTER — Emergency Department (HOSPITAL_COMMUNITY)
Admission: EM | Admit: 2012-10-21 | Discharge: 2012-10-21 | Disposition: A | Discharge status: Home / Self Care | Payer: Medicare Other | Attending: Emergency Medicine | Admitting: Emergency Medicine

## 2012-10-21 ENCOUNTER — Ambulatory Visit (INDEPENDENT_AMBULATORY_CARE_PROVIDER_SITE_OTHER): Payer: Medicare Other | Admitting: Internal Medicine

## 2012-10-21 ENCOUNTER — Encounter: Payer: Self-pay | Admitting: Internal Medicine

## 2012-10-21 ENCOUNTER — Emergency Department (HOSPITAL_COMMUNITY): Payer: Medicare Other

## 2012-10-21 ENCOUNTER — Encounter (HOSPITAL_COMMUNITY): Payer: Self-pay | Admitting: Emergency Medicine

## 2012-10-21 ENCOUNTER — Other Ambulatory Visit: Payer: Self-pay | Admitting: Internal Medicine

## 2012-10-21 VITALS — BP 118/82 | HR 118 | Temp 100.4°F

## 2012-10-21 DIAGNOSIS — Z7982 Long term (current) use of aspirin: Secondary | ICD-10-CM | POA: Insufficient documentation

## 2012-10-21 DIAGNOSIS — J111 Influenza due to unidentified influenza virus with other respiratory manifestations: Secondary | ICD-10-CM

## 2012-10-21 DIAGNOSIS — G8929 Other chronic pain: Secondary | ICD-10-CM | POA: Insufficient documentation

## 2012-10-21 DIAGNOSIS — F329 Major depressive disorder, single episode, unspecified: Secondary | ICD-10-CM | POA: Insufficient documentation

## 2012-10-21 DIAGNOSIS — I10 Essential (primary) hypertension: Secondary | ICD-10-CM | POA: Insufficient documentation

## 2012-10-21 DIAGNOSIS — J40 Bronchitis, not specified as acute or chronic: Secondary | ICD-10-CM

## 2012-10-21 DIAGNOSIS — G4730 Sleep apnea, unspecified: Secondary | ICD-10-CM | POA: Insufficient documentation

## 2012-10-21 DIAGNOSIS — Z8742 Personal history of other diseases of the female genital tract: Secondary | ICD-10-CM | POA: Insufficient documentation

## 2012-10-21 DIAGNOSIS — J069 Acute upper respiratory infection, unspecified: Secondary | ICD-10-CM

## 2012-10-21 DIAGNOSIS — Z862 Personal history of diseases of the blood and blood-forming organs and certain disorders involving the immune mechanism: Secondary | ICD-10-CM | POA: Insufficient documentation

## 2012-10-21 DIAGNOSIS — F3289 Other specified depressive episodes: Secondary | ICD-10-CM | POA: Insufficient documentation

## 2012-10-21 DIAGNOSIS — E119 Type 2 diabetes mellitus without complications: Secondary | ICD-10-CM | POA: Insufficient documentation

## 2012-10-21 DIAGNOSIS — I509 Heart failure, unspecified: Secondary | ICD-10-CM | POA: Insufficient documentation

## 2012-10-21 DIAGNOSIS — J45909 Unspecified asthma, uncomplicated: Secondary | ICD-10-CM | POA: Insufficient documentation

## 2012-10-21 DIAGNOSIS — J441 Chronic obstructive pulmonary disease with (acute) exacerbation: Secondary | ICD-10-CM

## 2012-10-21 DIAGNOSIS — IMO0001 Reserved for inherently not codable concepts without codable children: Secondary | ICD-10-CM | POA: Insufficient documentation

## 2012-10-21 DIAGNOSIS — F17200 Nicotine dependence, unspecified, uncomplicated: Secondary | ICD-10-CM | POA: Insufficient documentation

## 2012-10-21 DIAGNOSIS — Z8719 Personal history of other diseases of the digestive system: Secondary | ICD-10-CM | POA: Insufficient documentation

## 2012-10-21 DIAGNOSIS — Z8709 Personal history of other diseases of the respiratory system: Secondary | ICD-10-CM | POA: Insufficient documentation

## 2012-10-21 DIAGNOSIS — Z860100 Personal history of colon polyps, unspecified: Secondary | ICD-10-CM | POA: Insufficient documentation

## 2012-10-21 DIAGNOSIS — Z8659 Personal history of other mental and behavioral disorders: Secondary | ICD-10-CM | POA: Insufficient documentation

## 2012-10-21 DIAGNOSIS — Z79899 Other long term (current) drug therapy: Secondary | ICD-10-CM | POA: Insufficient documentation

## 2012-10-21 DIAGNOSIS — J9620 Acute and chronic respiratory failure, unspecified whether with hypoxia or hypercapnia: Secondary | ICD-10-CM

## 2012-10-21 DIAGNOSIS — R51 Headache: Secondary | ICD-10-CM | POA: Insufficient documentation

## 2012-10-21 DIAGNOSIS — E785 Hyperlipidemia, unspecified: Secondary | ICD-10-CM | POA: Insufficient documentation

## 2012-10-21 DIAGNOSIS — F411 Generalized anxiety disorder: Secondary | ICD-10-CM | POA: Insufficient documentation

## 2012-10-21 DIAGNOSIS — R52 Pain, unspecified: Secondary | ICD-10-CM | POA: Insufficient documentation

## 2012-10-21 DIAGNOSIS — J209 Acute bronchitis, unspecified: Secondary | ICD-10-CM | POA: Insufficient documentation

## 2012-10-21 DIAGNOSIS — J440 Chronic obstructive pulmonary disease with acute lower respiratory infection: Secondary | ICD-10-CM | POA: Insufficient documentation

## 2012-10-21 DIAGNOSIS — K219 Gastro-esophageal reflux disease without esophagitis: Secondary | ICD-10-CM | POA: Insufficient documentation

## 2012-10-21 LAB — COMPREHENSIVE METABOLIC PANEL
ALT: 40 U/L — ABNORMAL HIGH (ref 0–35)
Alkaline Phosphatase: 122 U/L — ABNORMAL HIGH (ref 39–117)
BUN: 11 mg/dL (ref 6–23)
CO2: 30 mEq/L (ref 19–32)
Chloride: 95 mEq/L — ABNORMAL LOW (ref 96–112)
GFR calc Af Amer: 82 mL/min — ABNORMAL LOW (ref >90)
GFR calc non Af Amer: 70 mL/min — ABNORMAL LOW (ref >90)
Glucose, Bld: 92 mg/dL (ref 70–99)
Potassium: 4 mEq/L (ref 3.5–5.1)
Sodium: 137 mEq/L (ref 135–145)
Total Bilirubin: 1 mg/dL (ref 0.3–1.2)
Total Protein: 8.3 g/dL (ref 6.0–8.3)

## 2012-10-21 LAB — CBC WITH DIFFERENTIAL/PLATELET
Basophils Relative: 1 % (ref 0–1)
Eosinophils Absolute: 0.1 10*3/uL (ref 0.0–0.7)
Eosinophils Relative: 1 % (ref 0–5)
HCT: 46 % (ref 36.0–46.0)
Hemoglobin: 15.1 g/dL — ABNORMAL HIGH (ref 12.0–15.0)
Lymphocytes Relative: 10 % — ABNORMAL LOW (ref 12–46)
MCH: 26.8 pg (ref 26.0–34.0)
MCHC: 32.8 g/dL (ref 30.0–36.0)
Neutro Abs: 7.1 10*3/uL (ref 1.7–7.7)
Neutrophils Relative %: 81 % — ABNORMAL HIGH (ref 43–77)
RBC: 5.63 MIL/uL — ABNORMAL HIGH (ref 3.87–5.11)

## 2012-10-21 MED ORDER — SODIUM CHLORIDE 0.9 % IV BOLUS (SEPSIS)
1000.0000 mL | Freq: Once | INTRAVENOUS | Status: AC
  Administered 2012-10-21: 1000 mL via INTRAVENOUS

## 2012-10-21 MED ORDER — IPRATROPIUM BROMIDE 0.02 % IN SOLN
0.5000 mg | Freq: Once | RESPIRATORY_TRACT | Status: AC
Start: 2012-10-21 — End: 2012-10-21
  Administered 2012-10-21: 0.5 mg via RESPIRATORY_TRACT
  Filled 2012-10-21: qty 2.5

## 2012-10-21 MED ORDER — AMOXICILLIN 500 MG PO CAPS
500.0000 mg | ORAL_CAPSULE | Freq: Once | ORAL | Status: AC
  Administered 2012-10-21: 500 mg via ORAL
  Filled 2012-10-21: qty 1

## 2012-10-21 MED ORDER — HYDROMORPHONE HCL PF 1 MG/ML IJ SOLN
1.0000 mg | Freq: Once | INTRAMUSCULAR | Status: AC
  Administered 2012-10-21: 1 mg via INTRAVENOUS
  Filled 2012-10-21: qty 1

## 2012-10-21 MED ORDER — OSELTAMIVIR PHOSPHATE 75 MG PO CAPS
75.0000 mg | ORAL_CAPSULE | Freq: Once | ORAL | Status: AC
  Administered 2012-10-21: 75 mg via ORAL
  Filled 2012-10-21: qty 1

## 2012-10-21 MED ORDER — ACETAMINOPHEN 325 MG PO TABS
650.0000 mg | ORAL_TABLET | Freq: Once | ORAL | Status: AC
  Administered 2012-10-21: 650 mg via ORAL
  Filled 2012-10-21: qty 2

## 2012-10-21 MED ORDER — AMOXICILLIN 500 MG PO CAPS: 500.0000 mg | ORAL_CAPSULE | Freq: Three times a day (TID) | ORAL | Status: DC

## 2012-10-21 MED ORDER — ALBUTEROL SULFATE (5 MG/ML) 0.5% IN NEBU
5.0000 mg | INHALATION_SOLUTION | Freq: Once | RESPIRATORY_TRACT | Status: AC
  Administered 2012-10-21: 5 mg via RESPIRATORY_TRACT
  Filled 2012-10-21: qty 1

## 2012-10-21 MED ORDER — OSELTAMIVIR PHOSPHATE 75 MG PO CAPS: 75.0000 mg | ORAL_CAPSULE | Freq: Two times a day (BID) | ORAL | Status: DC

## 2012-10-21 NOTE — ED Notes (Signed)
Pt c/o flu-like symptoms that includes fever, chills, cough,nausea, headache, generalized body aches, lower oxygen saturation (history of COPD). Pt reports taking an OTC expectorant, without relief.

## 2012-10-21 NOTE — Progress Notes (Addendum)
Subjective:   HPI  complains of flu-like symptoms  Onset <24h ago, rapidly worsening symptoms  associated with fever, headache, myalgia and exhaustion "can't breathe with or without my oxygen - like a panic attack" Also mild nasal congestion and dry cough Minimal relief with OTC meds Precipitated by sick contacts - "one tested positive for flu"  Past Medical History  Diagnosis Date  . Sleep apnea     noncompliant w/ CPAP  . Hypertension   . Hyperlipidemia   . Chronic headache   . Fibromyalgia     daily narcotics  . Anxiety     hx chronic BZ use, stopped 07/2010  . Anemia   . Pulmonary sarcoidosis     unimpressive CT chest 2011  . Colonic polyp   . GERD (gastroesophageal reflux disease)   . ALLERGIC RHINITIS   . Asthma   . CHF (congestive heart failure)     Diastolic with fluid overload, May, 2012, LVEF 60%  . Morbid obesity   . Depression   . Panic attacks   . Diabetes mellitus   . COPD (chronic obstructive pulmonary disease)     on home O2, moderate airflow obstruction, suspect d/t emphysema  . Obesity   . Elevated LFTs 09/2011  . Ovarian cyst   . Chronic back pain     Review of Systems Constitutional: No unexpected weight change Pulmonary: No pleurisy or hemoptysis Cardiovascular: + chest tightness with palpitations     Objective:   Physical Exam BP 118/82  Pulse 118  Temp 100.4 F (38 C) (Oral)  SpO2 80%  LMP 12/28/2010 GEN: moderatly ill appearing, fatigued and flushed HENT: NCAT, no sinus tenderness bilaterally, nares without discharge, oropharynx mild erythema, no exudate Eyes: Vision grossly intact, no conjunctivitis Neck: shoddy anterior LAD, supple with FROM Lungs: shallow effort, no rhonchi -soft exp R>L upper lobes - considerble increased work of breathing at rest and after min exertion (ambulating to restroom and back) Cardiovascular: tachy rate and rhythm, no bilateral edema  Lab Results  Component Value Date   WBC 9.0 10/17/2012   HGB  13.7 10/17/2012   HCT 42.6 10/17/2012   PLT 213 10/17/2012   GLUCOSE 119* 10/17/2012   CHOL 255* 02/13/2012   TRIG 133.0 02/13/2012   HDL 54.30 02/13/2012   LDLDIRECT 186.4 02/13/2012   LDLCALC  Value: 92        Total Cholesterol/HDL:CHD Risk Coronary Heart Disease Risk Table                     Men   Women  1/2 Average Risk   3.4   3.3  Average Risk       5.0   4.4  2 X Average Risk   9.6   7.1  3 X Average Risk  23.4   11.0        Use the calculated Patient Ratio above and the CHD Risk Table to determine the patient's CHD Risk.        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 Symptomatically persisting minimal expiratory wheeze but no increased work of breathing at rest or minimal exertion. Patient declines to continue waiting for EMS transport and will take herself to [facility] long emergency room as planned for further evaluation and treatment as needed.

## 2012-10-21 NOTE — ED Provider Notes (Signed)
History     CSN: [id number]  Arrival date & time 10/21/12  1753   First MD Initiated Contact with Patient 10/21/12 1757      Chief Complaint  Patient presents with  . Shortness of Breath  . Generalized Body Aches    (Consider location/radiation/quality/duration/timing/severity/associated sxs/prior treatment) Patient is a 47 y.o. female presenting with shortness of breath. The history is provided by the patient (pt complains of cough and fever with aches). No language interpreter was used.  Shortness of Breath  The current episode started today. The problem occurs frequently. The problem has been unchanged. The problem is moderate. Nothing relieves the symptoms. Nothing aggravates the symptoms. Associated symptoms include shortness of breath. Pertinent negatives include no chest pain and no cough. The cough has no precipitants. The cough is non-productive. There is no color change associated with the cough. Nothing relieves the cough. Nothing worsens the cough.    Past Medical History  Diagnosis Date  . Sleep apnea     noncompliant w/ CPAP  . Hypertension   . Hyperlipidemia   . Chronic headache   . Fibromyalgia     daily narcotics  . Anxiety     hx chronic BZ use, stopped 07/2010  . Anemia   . Pulmonary sarcoidosis     unimpressive CT chest 2011  . Colonic polyp   . GERD (gastroesophageal reflux disease)   . ALLERGIC RHINITIS   . Asthma   . CHF (congestive heart failure)     Diastolic with fluid overload, May, 2012, LVEF 60%  . Morbid obesity   . Depression   . Panic attacks   . Diabetes mellitus   . COPD (chronic obstructive pulmonary disease)     on home O2, moderate airflow obstruction, suspect d/t emphysema  . Obesity   . Elevated LFTs 09/2011  . Ovarian cyst   . Chronic back pain     Past Surgical History  Procedure Date  . Polypectomy 2011  . Lumbar microdiscectomy 07/06/2011    R L4-5, stern  . Back surgery   . Carpal tunnel release   . Steroid spinal  injections     Family History  Problem Relation Age of Onset  . Hypertension Mother   . Emphysema Father   . Hypertension Father   . Stomach cancer Father   . Allergies Brother   . Hypertension Brother   . Stomach cancer Brother   . Heart disease Mother   . Heart disease Father   . Heart disease Brother     History  Substance Use Topics  . Smoking status: Current Every Day Smoker -- 1.0 packs/day for 29 years    Types: Cigarettes    Last Attempt to Quit: 09/14/2010  . Smokeless tobacco: Never Used     Comment: Resumed smoking September 2013  . Alcohol Use: Yes     Comment: occasionally    OB History    Grav Para Term Preterm Abortions TAB SAB Ect Mult Living                  Review of Systems  Constitutional: Negative for fatigue.  HENT: Negative for congestion, sinus pressure and ear discharge.   Eyes: Negative for discharge.  Respiratory: Positive for shortness of breath. Negative for cough.   Cardiovascular: Negative for chest pain.  Gastrointestinal: Negative for abdominal pain and diarrhea.  Genitourinary: Negative for frequency and hematuria.  Musculoskeletal: Negative for back pain.  Skin: Negative for rash.  Neurological: Negative for  seizures and headaches.  Hematological: Negative.   Psychiatric/Behavioral: Negative for hallucinations.    Allergies  Ciprofloxacin; Latex; Sulfamethoxazole w-trimethoprim; Azithromycin; Doxycycline; Metformin and related; and Metronidazole  Home Medications   Current Outpatient Rx  Name  Route  Sig  Dispense  Refill  . ALBUTEROL SULFATE HFA 108 (90 BASE) MCG/ACT IN AERS   Inhalation   Inhale 2 puffs into the lungs every 6 (six) hours as needed. For wheeze or shortness of breath   1 Inhaler   0   . ALPRAZOLAM 1 MG PO TABS   Oral   Take 1 tablet (1 mg total) by mouth 2 (two) times daily as needed. anxiety   60 tablet   3   . ASPIRIN 81 MG PO TBEC   Oral   Take 81 mg by mouth daily.         . ATORVASTATIN  CALCIUM 10 MG PO TABS   Oral   Take 1 tablet (10 mg total) by mouth daily.   90 tablet   3   . BUDESONIDE-FORMOTEROL FUMARATE 160-4.5 MCG/ACT IN AERO   Inhalation   Inhale 2 puffs into the lungs 2 (two) times daily.   1 Inhaler   2   . CETIRIZINE HCL 10 MG PO TABS   Oral   Take 10 mg by mouth at bedtime.         Marland Kitchen ESOMEPRAZOLE MAGNESIUM 40 MG PO CPDR   Oral   Take 40 mg by mouth 2 (two) times daily.         Marland Kitchen GLIMEPIRIDE 1 MG PO TABS   Oral   Take 1 mg by mouth daily before breakfast.         . GUAIATUSSIN AC 100-10 MG/5ML PO SYRP      TAKE 10 ML BY MOUTH EVERY 6 HOURS AS NEEDED FOR COUGH   240 mL   0   . IBUPROFEN 600 MG PO TABS   Oral   Take 1 tablet (600 mg total) by mouth every 6 (six) hours as needed for pain.   30 tablet   0   . INSULIN GLARGINE 100 UNIT/ML [place] SOLN   Subcutaneous   Inject 8-16 Units into the skin at bedtime. If blood sugar is over 100, patient uses 16 units. If under 100, patient uses 8 units         . MOMETASONE FUROATE 50 MCG/ACT NA SUSP   Nasal   Place 2 sprays into the nose daily.         . OXYCODONE HCL 10 MG PO TABS   Oral   Take 0.5-1 tablets (5-10 mg total) by mouth 3 (three) times daily as needed.   90 tablet   0     Do not fill more than once every 30 days   . POTASSIUM CHLORIDE CRYS ER 20 MEQ PO TBCR   Oral   Take 20 mEq by mouth 2 (two) times daily.          . SERTRALINE HCL 25 MG PO TABS   Oral   Take 25 mg by mouth daily.         Marland Kitchen SPIRONOLACTONE 25 MG PO TABS      TAKE 1 TABLET BY MOUTH DAILY   30 tablet   5   . TIOTROPIUM BROMIDE MONOHYDRATE 18 MCG IN CAPS   Inhalation   Place 18 mcg into inhaler and inhale daily.         . TORSEMIDE 100 MG  PO TABS   Oral   Take 1 tablet (100 mg total) by mouth 2 (two) times daily.   60 tablet   5   . AMOXICILLIN 500 MG PO CAPS   Oral   Take 1 capsule (500 mg total) by mouth 3 (three) times daily.   21 capsule   0   . OSELTAMIVIR PHOSPHATE 75 MG PO  CAPS   Oral   Take 1 capsule (75 mg total) by mouth every 12 (twelve) hours.   10 capsule   0     BP 121/74  Pulse 117  Temp 101.2 F (38.4 C) (Oral)  Resp 16  SpO2 95%  LMP 10/16/2012  Physical Exam  Constitutional: She is oriented to person, place, and time. She appears well-developed.  HENT:  Head: Normocephalic and atraumatic.  Eyes: Conjunctivae normal and EOM are normal. No scleral icterus.  Neck: Neck supple. No thyromegaly present.  Cardiovascular: Normal rate and regular rhythm.  Exam reveals no gallop and no friction rub.   No murmur heard. Pulmonary/Chest: No stridor. She has no wheezes. She has no rales. She exhibits no tenderness.  Abdominal: She exhibits no distension. There is no tenderness. There is no rebound.  Musculoskeletal: Normal range of motion. She exhibits no edema.  Lymphadenopathy:    She has no cervical adenopathy.  Neurological: She is oriented to person, place, and time. Coordination normal.  Skin: No rash noted. No erythema.  Psychiatric: She has a normal mood and affect. Her behavior is normal.    ED Course  Procedures (including critical care time)  Labs Reviewed  CBC WITH DIFFERENTIAL - Abnormal; Notable for the following:    RBC 5.63 (*)     Hemoglobin 15.1 (*)     RDW 18.7 (*)     Neutrophils Relative 81 (*)     Lymphocytes Relative 10 (*)     All other components within normal limits  COMPREHENSIVE METABOLIC PANEL - Abnormal; Notable for the following:    Chloride 95 (*)     ALT 40 (*)     Alkaline Phosphatase 122 (*)     GFR calc non Af Amer 70 (*)     GFR calc Af Amer 82 (*)     All other components within normal limits   Dg Chest 2 View  10/21/2012  *RADIOLOGY REPORT*  Clinical Data: Shortness of breath, history of COPD, asthma, pulmonary sarcoidosis  CHEST - 2 VIEW  Comparison: 09/14/2012; 09/01/2012; chest CTA - 09/01/2012  Findings: Grossly unchanged enlarged cardiac silhouette and mediastinal contours with prominence of  the central pulmonary vasculature.  There is grossly unchanged diffuse thickening of the pulmonary interstitium with basilar predominance.  Grossly unchanged bibasilar linear heterogeneous opacities favored to represent subsegmental atelectasis or scar.  No new focal airspace opacity.  No pleural effusion or pneumothorax.  Unchanged bones.  IMPRESSION: 1.  Chronic bronchitic change with bibasilar atelectasis/scar without definite acute cardiopulmonary disease. 2.  Unchanged prominence of the central pulmonary vasculature as may be seen in the setting of pulmonary arterial hypertension. Further evaluation with cardiac echo may be performed as clinically indicated.   Original Report Authenticated By: Tacey Ruiz, MD      1. Influenza   2. Bronchitis       MDM          Benny Lennert, MD 10/21/12 2041

## 2012-10-21 NOTE — Patient Instructions (Signed)
It was good to see you today. EMS will take you to [facility] long emergency room for further evaluation and treatment of your flu-like symptoms

## 2012-10-21 NOTE — Telephone Encounter (Signed)
Faxed script back to [facility],,,/lmb 

## 2012-10-23 ENCOUNTER — Ambulatory Visit: Payer: Self-pay | Admitting: Pulmonary Disease

## 2012-10-24 ENCOUNTER — Ambulatory Visit: Payer: Self-pay | Admitting: Pulmonary Disease

## 2012-10-25 ENCOUNTER — Ambulatory Visit: Payer: Self-pay | Admitting: Pulmonary Disease

## 2012-10-28 ENCOUNTER — Other Ambulatory Visit: Payer: Self-pay | Admitting: Internal Medicine

## 2012-10-28 ENCOUNTER — Emergency Department (HOSPITAL_COMMUNITY)
Admission: EM | Admit: 2012-10-28 | Discharge: 2012-10-29 | Disposition: A | Discharge status: Home / Self Care | Payer: Medicare Other | Attending: Emergency Medicine | Admitting: Emergency Medicine

## 2012-10-28 ENCOUNTER — Encounter (HOSPITAL_COMMUNITY): Payer: Self-pay | Admitting: *Deleted

## 2012-10-28 DIAGNOSIS — F17200 Nicotine dependence, unspecified, uncomplicated: Secondary | ICD-10-CM | POA: Insufficient documentation

## 2012-10-28 DIAGNOSIS — Z9119 Patient's noncompliance with other medical treatment and regimen: Secondary | ICD-10-CM | POA: Insufficient documentation

## 2012-10-28 DIAGNOSIS — Z7982 Long term (current) use of aspirin: Secondary | ICD-10-CM | POA: Insufficient documentation

## 2012-10-28 DIAGNOSIS — R109 Unspecified abdominal pain: Secondary | ICD-10-CM | POA: Insufficient documentation

## 2012-10-28 DIAGNOSIS — K219 Gastro-esophageal reflux disease without esophagitis: Secondary | ICD-10-CM | POA: Insufficient documentation

## 2012-10-28 DIAGNOSIS — J449 Chronic obstructive pulmonary disease, unspecified: Secondary | ICD-10-CM | POA: Insufficient documentation

## 2012-10-28 DIAGNOSIS — F411 Generalized anxiety disorder: Secondary | ICD-10-CM | POA: Insufficient documentation

## 2012-10-28 DIAGNOSIS — IMO0002 Reserved for concepts with insufficient information to code with codable children: Secondary | ICD-10-CM | POA: Insufficient documentation

## 2012-10-28 DIAGNOSIS — R0989 Other specified symptoms and signs involving the circulatory and respiratory systems: Secondary | ICD-10-CM | POA: Insufficient documentation

## 2012-10-28 DIAGNOSIS — F3289 Other specified depressive episodes: Secondary | ICD-10-CM | POA: Insufficient documentation

## 2012-10-28 DIAGNOSIS — J309 Allergic rhinitis, unspecified: Secondary | ICD-10-CM | POA: Insufficient documentation

## 2012-10-28 DIAGNOSIS — K5289 Other specified noninfective gastroenteritis and colitis: Secondary | ICD-10-CM | POA: Insufficient documentation

## 2012-10-28 DIAGNOSIS — Z862 Personal history of diseases of the blood and blood-forming organs and certain disorders involving the immune mechanism: Secondary | ICD-10-CM | POA: Insufficient documentation

## 2012-10-28 DIAGNOSIS — F329 Major depressive disorder, single episode, unspecified: Secondary | ICD-10-CM | POA: Insufficient documentation

## 2012-10-28 DIAGNOSIS — Z79899 Other long term (current) drug therapy: Secondary | ICD-10-CM | POA: Insufficient documentation

## 2012-10-28 DIAGNOSIS — Z8742 Personal history of other diseases of the female genital tract: Secondary | ICD-10-CM | POA: Insufficient documentation

## 2012-10-28 DIAGNOSIS — R197 Diarrhea, unspecified: Secondary | ICD-10-CM

## 2012-10-28 DIAGNOSIS — K626 Ulcer of anus and rectum: Secondary | ICD-10-CM | POA: Insufficient documentation

## 2012-10-28 DIAGNOSIS — Z860100 Personal history of colon polyps, unspecified: Secondary | ICD-10-CM | POA: Insufficient documentation

## 2012-10-28 DIAGNOSIS — R0609 Other forms of dyspnea: Secondary | ICD-10-CM | POA: Insufficient documentation

## 2012-10-28 DIAGNOSIS — K529 Noninfective gastroenteritis and colitis, unspecified: Secondary | ICD-10-CM

## 2012-10-28 DIAGNOSIS — I10 Essential (primary) hypertension: Secondary | ICD-10-CM | POA: Insufficient documentation

## 2012-10-28 DIAGNOSIS — G8929 Other chronic pain: Secondary | ICD-10-CM | POA: Insufficient documentation

## 2012-10-28 DIAGNOSIS — Z8719 Personal history of other diseases of the digestive system: Secondary | ICD-10-CM | POA: Insufficient documentation

## 2012-10-28 DIAGNOSIS — Z8619 Personal history of other infectious and parasitic diseases: Secondary | ICD-10-CM | POA: Insufficient documentation

## 2012-10-28 DIAGNOSIS — G4730 Sleep apnea, unspecified: Secondary | ICD-10-CM | POA: Insufficient documentation

## 2012-10-28 DIAGNOSIS — J4489 Other specified chronic obstructive pulmonary disease: Secondary | ICD-10-CM | POA: Insufficient documentation

## 2012-10-28 DIAGNOSIS — Z8659 Personal history of other mental and behavioral disorders: Secondary | ICD-10-CM | POA: Insufficient documentation

## 2012-10-28 DIAGNOSIS — Z794 Long term (current) use of insulin: Secondary | ICD-10-CM | POA: Insufficient documentation

## 2012-10-28 DIAGNOSIS — R112 Nausea with vomiting, unspecified: Secondary | ICD-10-CM

## 2012-10-28 DIAGNOSIS — E119 Type 2 diabetes mellitus without complications: Secondary | ICD-10-CM | POA: Insufficient documentation

## 2012-10-28 DIAGNOSIS — E785 Hyperlipidemia, unspecified: Secondary | ICD-10-CM | POA: Insufficient documentation

## 2012-10-28 DIAGNOSIS — IMO0001 Reserved for inherently not codable concepts without codable children: Secondary | ICD-10-CM | POA: Insufficient documentation

## 2012-10-28 DIAGNOSIS — Z91199 Patient's noncompliance with other medical treatment and regimen due to unspecified reason: Secondary | ICD-10-CM | POA: Insufficient documentation

## 2012-10-28 DIAGNOSIS — M549 Dorsalgia, unspecified: Secondary | ICD-10-CM | POA: Insufficient documentation

## 2012-10-28 DIAGNOSIS — Z8679 Personal history of other diseases of the circulatory system: Secondary | ICD-10-CM | POA: Insufficient documentation

## 2012-10-28 DIAGNOSIS — I509 Heart failure, unspecified: Secondary | ICD-10-CM | POA: Insufficient documentation

## 2012-10-28 MED ORDER — ONDANSETRON 4 MG PO TBDP
4.0000 mg | ORAL_TABLET | Freq: Once | ORAL | Status: AC
  Administered 2012-10-29: 4 mg via ORAL
  Filled 2012-10-28: qty 1

## 2012-10-28 MED ORDER — ALUM & MAG HYDROXIDE-SIMETH 200-200-20 MG/5ML PO SUSP
30.0000 mL | Freq: Once | ORAL | Status: AC
  Administered 2012-10-29: 30 mL via ORAL
  Filled 2012-10-28: qty 30

## 2012-10-28 NOTE — ED Provider Notes (Signed)
History     CSN: [id number]  Arrival date & time 10/28/12  2127   First MD Initiated Contact with Patient 10/28/12 2255      Chief Complaint  Patient presents with  . COPD    (Consider location/radiation/quality/duration/timing/severity/associated sxs/prior treatment) HPIWilma C Anderson is a 47 y.o. female with PMH significant for COPD, chronic pain presents with nausea, vomiting and diarrhea.  Pt has had associated cramping in her abdomen, 6/10, diffuse, intermittent, her belly feels swollen, food i unappetizing, drinking fluids ok.  She's had 4 episodes of diarrhea yesterday, 5 today; the stools have been "stringy" with bright red blood streaks on Toilet paper and some in the bowl.  Pt has h/o hemorrhoid.  Pt completed tamiflu and amoxicillin last Friday   She has had some dyspnea, this typically resolves with inhalers, no chest pain, no fevers, melena, myalgias have resolved, cough is improving.  Denies headache paresthesias, dysuria or frequency.   Past Medical History  Diagnosis Date  . Sleep apnea     noncompliant w/ CPAP  . Hypertension   . Hyperlipidemia   . Chronic headache   . Fibromyalgia     daily narcotics  . Anxiety     hx chronic BZ use, stopped 07/2010  . Anemia   . Pulmonary sarcoidosis     unimpressive CT chest 2011  . Colonic polyp   . GERD (gastroesophageal reflux disease)   . ALLERGIC RHINITIS   . Asthma   . CHF (congestive heart failure)     Diastolic with fluid overload, May, 2012, LVEF 60%  . Morbid obesity   . Depression   . Panic attacks   . Diabetes mellitus   . COPD (chronic obstructive pulmonary disease)     on home O2, moderate airflow obstruction, suspect d/t emphysema  . Obesity   . Elevated LFTs 09/2011  . Ovarian cyst   . Chronic back pain     Past Surgical History  Procedure Date  . Polypectomy 2011  . Lumbar microdiscectomy 07/06/2011    R L4-5, stern  . Back surgery   . Carpal tunnel release   . Steroid spinal injections      Family History  Problem Relation Age of Onset  . Hypertension Mother   . Emphysema Father   . Hypertension Father   . Stomach cancer Father   . Allergies Brother   . Hypertension Brother   . Stomach cancer Brother   . Heart disease Mother   . Heart disease Father   . Heart disease Brother     History  Substance Use Topics  . Smoking status: Current Every Day Smoker -- 1.0 packs/day for 29 years    Types: Cigarettes    Last Attempt to Quit: 09/14/2010  . Smokeless tobacco: Never Used     Comment: Resumed smoking September 2013  . Alcohol Use: Yes     Comment: occasionally    OB History    Grav Para Term Preterm Abortions TAB SAB Ect Mult Living                  Review of Systems At least 10pt or greater review of systems completed and are negative except where specified in the HPI.  Allergies  Ciprofloxacin; Latex; Sulfamethoxazole w-trimethoprim; Azithromycin; Doxycycline; Metformin and related; and Metronidazole  Home Medications   Current Outpatient Rx  Name  Route  Sig  Dispense  Refill  . ALBUTEROL SULFATE HFA 108 (90 BASE) MCG/ACT IN AERS   Inhalation  Inhale 2 puffs into the lungs every 6 (six) hours as needed. For wheeze or shortness of breath   1 Inhaler   0   . ALPRAZOLAM 1 MG PO TABS   Oral   Take 1 tablet (1 mg total) by mouth 2 (two) times daily as needed. anxiety   60 tablet   3   . AMOXICILLIN 500 MG PO CAPS   Oral   Take 1 capsule (500 mg total) by mouth 3 (three) times daily.   21 capsule   0   . ASPIRIN 81 MG PO TBEC   Oral   Take 81 mg by mouth daily.         . ATORVASTATIN CALCIUM 10 MG PO TABS   Oral   Take 1 tablet (10 mg total) by mouth daily.   90 tablet   3   . BUDESONIDE-FORMOTEROL FUMARATE 160-4.5 MCG/ACT IN AERO   Inhalation   Inhale 2 puffs into the lungs 2 (two) times daily.   1 Inhaler   2   . CETIRIZINE HCL 10 MG PO TABS   Oral   Take 10 mg by mouth at bedtime.         Marland Kitchen ESOMEPRAZOLE MAGNESIUM 40  MG PO CPDR   Oral   Take 40 mg by mouth 2 (two) times daily.         Marland Kitchen GLIMEPIRIDE 1 MG PO TABS   Oral   Take 1 mg by mouth daily before breakfast.         . GUAIATUSSIN AC 100-10 MG/5ML PO SYRP      TAKE 10 ML BY MOUTH EVERY 6 HOURS AS NEEDED FOR COUGH   240 mL   0   . IBUPROFEN 600 MG PO TABS   Oral   Take 1 tablet (600 mg total) by mouth every 6 (six) hours as needed for pain.   30 tablet   0   . INSULIN GLARGINE 100 UNIT/ML [place] SOLN   Subcutaneous   Inject 8-16 Units into the skin at bedtime. If blood sugar is over 100, patient uses 16 units. If under 100, patient uses 8 units         . MOMETASONE FUROATE 50 MCG/ACT NA SUSP   Nasal   Place 2 sprays into the nose daily.         . OXYCODONE HCL 10 MG PO TABS   Oral   Take 0.5-1 tablets (5-10 mg total) by mouth 3 (three) times daily as needed.   90 tablet   0     Do not fill more than once every 30 days   . POTASSIUM CHLORIDE CRYS ER 20 MEQ PO TBCR   Oral   Take 20 mEq by mouth 2 (two) times daily.          . SERTRALINE HCL 25 MG PO TABS   Oral   Take 25 mg by mouth daily.         Marland Kitchen SPIRONOLACTONE 25 MG PO TABS      TAKE 1 TABLET BY MOUTH DAILY   30 tablet   5   . TIOTROPIUM BROMIDE MONOHYDRATE 18 MCG IN CAPS   Inhalation   Place 18 mcg into inhaler and inhale daily.         . TORSEMIDE 100 MG PO TABS   Oral   Take 1 tablet (100 mg total) by mouth 2 (two) times daily.   60 tablet   5   . OSELTAMIVIR  PHOSPHATE 75 MG PO CAPS   Oral   Take 1 capsule (75 mg total) by mouth every 12 (twelve) hours.   10 capsule   0     BP 120/75  Pulse 98  Temp 97.7 F (36.5 C) (Oral)  Resp 20  SpO2 95%  LMP 10/16/2012  Physical Exam  Nursing notes reviewed.  Electronic medical record reviewed. VITAL SIGNS:   Filed Vitals:   10/28/12 2136 10/29/12 0028  BP: 120/75 114/53  Pulse: 98 69  Temp: 97.7 F (36.5 C)   TempSrc: Oral   Resp: 20 16  SpO2: 95% 98%   CONSTITUTIONAL: Awake,  oriented, appears non-toxic HENT: Atraumatic, normocephalic, oral mucosa pink and moist, airway patent. Nares patent without drainage. External ears normal. EYES: Conjunctiva clear, EOMI, PERRLA NECK: Trachea midline, non-tender, supple CARDIOVASCULAR: Normal heart rate, Normal rhythm, No murmurs, rubs, gallops PULMONARY/CHEST: Clear to auscultation, no rhonchi, wheezes, or rales. Symmetrical breath sounds. Non-tender. ABDOMINAL: Non-distended, soft, non-tender - no rebound or guarding.  BS normal. Rectal: Normal tone, no gross blood, occult blood negative.  Minor tears in anal mucosa. NEUROLOGIC: Non-focal, moving all four extremities, no gross sensory or motor deficits. EXTREMITIES: No clubbing, cyanosis, or edema SKIN: Warm, Dry, No erythema, No rash  ED Course  Procedures (including critical care time)   Labs Reviewed  OCCULT BLOOD, POC DEVICE  LAB REPORT - SCANNED   No results found.   1. Nausea and vomiting   2. Diarrhea   3. Acute gastroenteritis       MDM  LAPORSCHA LINEHAN is a 47 y.o. female presents with NVD post tamiflu and amoxicillin - possible related to meds vs AGE.  Pt is non toxic, VSS/WNL.  Pt has small anal tears (not fissures) likely causing minor bleeding on frequent defecation.  Doubt dysentery or AGIB.  PT abdomen is non acute.  She is feeling better with Zofran and loperamide -will DC with the same.  Discussed clear liquid diet, and gradually adding backs solids. I explained the diagnosis and have given explicit precautions to return to the ER including any other new or worsening symptoms. The patient understands and accepts the medical plan as it's been dictated and I have answered her questions. Discharge instructions concerning home care and prescriptions have been given.  The patient is STABLE and is discharged to home in good condition.          Jones Skene, MD 10/29/12 1129

## 2012-10-28 NOTE — ED Notes (Signed)
Pt states that she was seen last week and diagnosed with Flu; was prescribed Tamiflu and antibiotics; pt reports that she began having diarrhea on 2/2; reports 5 episodes of diarrhea in the last 24hrs; pt reports that diarrhea has had some blood tinge to it; pt also reports that "My breathing isn't right"; pt c/o SHOB and cough; no relief of SHOB from BiPap last pm; pt also c/o general malaise.

## 2012-10-29 LAB — OCCULT BLOOD, POC DEVICE: Fecal Occult Bld: NEGATIVE

## 2012-10-29 MED ORDER — LOPERAMIDE HCL 2 MG PO CAPS: 2.0000 mg | ORAL_CAPSULE | Freq: Four times a day (QID) | ORAL | Status: DC | PRN

## 2012-10-29 MED ORDER — LOPERAMIDE HCL 2 MG PO CAPS
4.0000 mg | ORAL_CAPSULE | Freq: Once | ORAL | Status: AC
  Administered 2012-10-29: 4 mg via ORAL
  Filled 2012-10-29: qty 2

## 2012-10-29 MED ORDER — ONDANSETRON 4 MG PO TBDP: 4.0000 mg | ORAL_TABLET | Freq: Three times a day (TID) | ORAL | Status: DC | PRN

## 2012-10-30 ENCOUNTER — Ambulatory Visit: Payer: Medicare Other | Admitting: Pulmonary Disease

## 2012-11-09 ENCOUNTER — Other Ambulatory Visit: Payer: Self-pay | Admitting: Internal Medicine

## 2012-11-13 ENCOUNTER — Ambulatory Visit: Payer: Medicare Other | Admitting: Internal Medicine

## 2012-11-14 ENCOUNTER — Encounter: Payer: Self-pay | Admitting: Internal Medicine

## 2012-11-14 ENCOUNTER — Ambulatory Visit (INDEPENDENT_AMBULATORY_CARE_PROVIDER_SITE_OTHER): Payer: Medicare Other | Admitting: Internal Medicine

## 2012-11-14 VITALS — BP 122/84 | HR 112 | Temp 97.4°F | Wt 291.4 lb

## 2012-11-14 DIAGNOSIS — M5431 Sciatica, right side: Secondary | ICD-10-CM

## 2012-11-14 MED ORDER — CYCLOBENZAPRINE HCL 5 MG PO TABS: 5.0000 mg | ORAL_TABLET | Freq: Three times a day (TID) | ORAL | Status: DC | PRN

## 2012-11-14 MED ORDER — PREDNISONE (PAK) 10 MG PO TABS: 10.0000 mg | ORAL_TABLET | ORAL | Status: DC

## 2012-11-14 NOTE — Assessment & Plan Note (Signed)
On bipap - baseline hypercarbia reviewed follow up pulm as planned

## 2012-11-14 NOTE — Progress Notes (Signed)
Subjective:    [name], female    DOB: 1965/12/21, 47 y.o.   MRN: [id number]  HPI  complains of recurrent RLE pain Hx same with radiculopathy, s/p surgery for same 06/2011 "rebulge" 06/2012 at L4, tried [facility] 08/2012 x 1 - ineffective Now 5 days of severe RLE pain, feels knee is "giving out", but no falls  Past Medical History  Diagnosis Date  . Sleep apnea     noncompliant w/ CPAP  . Hypertension   . Hyperlipidemia   . Chronic headache   . Fibromyalgia     daily narcotics  . Anxiety     hx chronic BZ use, stopped 07/2010  . Anemia   . Pulmonary sarcoidosis     unimpressive CT chest 2011  . Colonic polyp   . GERD (gastroesophageal reflux disease)   . ALLERGIC RHINITIS   . Asthma   . CHF (congestive heart failure)     Diastolic with fluid overload, May, 2012, LVEF 60%  . Morbid obesity   . Depression   . Panic attacks   . Diabetes mellitus   . COPD (chronic obstructive pulmonary disease)     on home O2, moderate airflow obstruction, suspect d/t emphysema  . Obesity   . Elevated LFTs 09/2011  . Ovarian cyst   . Chronic back pain     Past Surgical History  Procedure Laterality Date  . Polypectomy  2011  . Lumbar microdiscectomy  07/06/2011    R L4-5, stern  . Back surgery    . Carpal tunnel release    . Steroid spinal injections       Review of Systems     Objective:   Physical Exam BP 122/84  Pulse 112  Temp(Src) 97.4 F (36.3 C) (Oral)  Wt 291 lb 6.4 oz (132.178 kg)  BMI 43.66 kg/m2  SpO2 88%  LMP 10/16/2012 Gen: obese, NAD Lungs: CTA B, decreased air mvt at bases CV: distant but RRR MSkel: Back: full range of motion of thoracic and lumbar spine. Non tender to palpation. Negative straight leg raise. DTR's are symmetrically intact. Sensation intact in all dermatomes of the lower extremities. Full strength to manual muscle testing. patient is able to heel toe walk without difficulty and ambulates with antalgic gait.  Lab Results   Component Value Date   WBC 8.8 10/21/2012   HGB 15.1* 10/21/2012   HCT 46.0 10/21/2012   PLT 184 10/21/2012   GLUCOSE 92 10/21/2012   CHOL 255* 02/13/2012   TRIG 133.0 02/13/2012   HDL 54.30 02/13/2012   LDLDIRECT 186.4 02/13/2012   LDLCALC  Value: 92        Total Cholesterol/HDL:CHD Risk Coronary Heart Disease Risk Table                     Men   Women  1/2 Average Risk   3.4   3.3  Average Risk       5.0   4.4  2 X Average Risk   9.6   7.1  3 X Average Risk  23.4   11.0        Use the calculated Patient Ratio above and the CHD Risk Table to determine the patient's CHD Risk.        ATP III CLASSIFICATION (LDL):  <100     mg/dL   Optimal  782-956  mg/dL   Near or Above  Optimal  130-159  mg/dL   Borderline  914-782  mg/dL   High  >956     mg/dL   Very High 11/08/863   ALT 40* 10/21/2012   AST 35 10/21/2012   NA 137 10/21/2012   K 4.0 10/21/2012   CL 95* 10/21/2012   CREATININE 0.96 10/21/2012   BUN 11 10/21/2012   CO2 30 10/21/2012   TSH 0.701 04/22/2012   HGBA1C 5.9* 09/01/2012       Assessment & Plan:   RLE sciatica - hx same with surgical repair 06/2011 Known L$ re"bulge" on MRI 06/2012 nd ineffective relief with ESI 08/2012 Now acute exac - tx with pred x 12d and flexeril Advised to follow up with Nsurg as ongoing to reconsider need for repeat surgical intervention

## 2012-11-14 NOTE — Patient Instructions (Signed)
It was good to see you today. Pre taper x 12 days and flexeril as needed for back/leg pain Other Medications reviewed, no other changes at this time.  followup with Dr. Venetia Maxon about possible surgery, call here if other systems needed

## 2012-11-14 NOTE — Assessment & Plan Note (Signed)
New dx, clarified 11/2011 - exacerbated by frequent pred use Intol of metformin due to severe diarrhea side effects  On Lantus pen qhs and Amaryl in AM - titrate as needed recheck a1c q 3 mo patient advised on anticipated increase in cbgs due to steroid effect for next several days - pt will call if cbg>200  Lab Results  Component Value Date   HGBA1C 5.9* 09/01/2012

## 2012-11-15 ENCOUNTER — Telehealth: Payer: Self-pay | Admitting: *Deleted

## 2012-11-15 NOTE — Telephone Encounter (Signed)
Notified pt with md response.../lmb 

## 2012-11-15 NOTE — Telephone Encounter (Signed)
Left msg on vm stating saw md yesterday concerning her leg. The pain in her lig is getting worse. Hurts when she laying down been trying to prop leg up & keep balance. Wanting md advisement on what she can do to ease the pain...Raechel Chute

## 2012-11-15 NOTE — Telephone Encounter (Signed)
Can take an extra pain pill if needed - can give early refill on oxy if neeed - Give the pred time to work, follow up with dr Venetia Maxon

## 2012-11-18 ENCOUNTER — Encounter: Payer: Self-pay | Admitting: Internal Medicine

## 2012-11-18 ENCOUNTER — Other Ambulatory Visit: Payer: Self-pay | Admitting: Pulmonary Disease

## 2012-11-18 ENCOUNTER — Ambulatory Visit (INDEPENDENT_AMBULATORY_CARE_PROVIDER_SITE_OTHER): Payer: Medicare Other | Admitting: Pulmonary Disease

## 2012-11-18 ENCOUNTER — Encounter: Payer: Self-pay | Admitting: Pulmonary Disease

## 2012-11-18 ENCOUNTER — Other Ambulatory Visit: Payer: Self-pay | Admitting: *Deleted

## 2012-11-18 VITALS — BP 116/64 | HR 106 | Temp 98.9°F | Ht 68.5 in | Wt 294.5 lb

## 2012-11-18 DIAGNOSIS — J449 Chronic obstructive pulmonary disease, unspecified: Secondary | ICD-10-CM

## 2012-11-18 DIAGNOSIS — M5416 Radiculopathy, lumbar region: Secondary | ICD-10-CM

## 2012-11-18 MED ORDER — OXYCODONE HCL 10 MG PO TABS: 5.0000 mg | ORAL_TABLET | Freq: Three times a day (TID) | ORAL | Status: DC | PRN

## 2012-11-18 NOTE — Assessment & Plan Note (Signed)
The patient unfortunately restarted smoking again, but is now down to 2-3 cigarettes per day along with an electronic cigarette.  I have stressed to her again that we can never keep her well if she continues to smoke.  She is on a very aggressive bronchodilator regimen, and is even on prednisone currently.  Her main complaint today is that of a barking cough, but she is not congested in her chest or bringing up purulent mucus.  She is describing significant reflux symptoms with regurgitation at night upon lying down.  I would like to treat this more aggressively to see if we can help her cough and hoarseness.  Part of her shortness of breath is also due to her diastolic heart failure, and morbid obesity with deconditioning.  I will see her back in about 4-6 weeks to see how she is doing.

## 2012-11-18 NOTE — Patient Instructions (Addendum)
Stay on your bipap, and keep up with mask changes and supplies. You must stop smoking 100%! No change in breathing medications for now Finish up prednisone as you are taking. Increase nexium to one in am AND pm for next 4 weeks. Take zantac 150mg  at bedtime for next 4 weeks. followup with me in 4 -6 weeks.

## 2012-11-18 NOTE — Progress Notes (Signed)
  Subjective:    [name], female    DOB: [birth year], 47 y.o.   MRN: [id number]  HPI The patient comes in today for followup of her chronic respiratory failure that is multifactorial.  She has known COPD, sleep apnea with obesity hypoventilation, diastolic heart failure, and also morbid obesity with deconditioning.  Unfortunately, she has started smoking again, has had multiple ER visits and hospitalizations since that time.  I have not seen her since August of last year.  She comes in today where she continues to have a dry barking cough, a feeling of fullness in her throat, and a feeling that her throat is closing in.  She is not having a lot of congestion or purulent mucus.  She is more short of breath than usual, but is on a very good bronchodilator regimen and is on a course of prednisone for back issues.  She also states that she has been wearing her bilevel compliantly, and is having no issues with her mask fit or pressure.   Review of Systems  Constitutional: Negative for fever and unexpected weight change.  HENT: Negative for ear pain, nosebleeds, congestion, sore throat, rhinorrhea, sneezing, trouble swallowing, dental problem, postnasal drip and sinus pressure.   Eyes: Negative for redness and itching.  Respiratory: Positive for cough, shortness of breath and wheezing. Negative for chest tightness.   Cardiovascular: Negative for palpitations and leg swelling.  Gastrointestinal: Negative for nausea and vomiting.  Genitourinary: Negative for dysuria.  Musculoskeletal: Negative for joint swelling.  Skin: Negative for rash.  Neurological: Negative for headaches.  Hematological: Does not bruise/bleed easily.  Psychiatric/Behavioral: Negative for dysphoric mood. The patient is not nervous/anxious.        Objective:   Physical Exam Morbidly obese female in no acute distress Nose without purulence or discharge noted Oropharynx clear Neck without lymphadenopathy or  thyromegaly Chest with decreased breath sounds, but no wheezes or crackles Cardiac exam sounds regular, with a 2 or 6 systolic murmur Lower extremities with 1+ edema, no cyanosis Alert and oriented, moves all 4 extremities.       Assessment & Plan:

## 2012-11-18 NOTE — Telephone Encounter (Signed)
Notified pt rx ready for pick-up.../lmb 

## 2012-11-18 NOTE — Telephone Encounter (Signed)
Left msg on vm requesting refill on her oxycodone...Raechel Chute

## 2012-11-18 NOTE — Assessment & Plan Note (Signed)
The patient states that she is trying to wear her bilevel device every night, and is not having any issues with her mask fit.  I've encouraged her to work aggressively on weight loss.

## 2012-11-20 ENCOUNTER — Telehealth: Payer: Self-pay

## 2012-11-20 DIAGNOSIS — R8290 Unspecified abnormal findings in urine: Secondary | ICD-10-CM

## 2012-11-20 NOTE — Telephone Encounter (Signed)
Pt called requesting and order for a UA to evaluate malodorous and cloudy urine, okay to order?

## 2012-11-20 NOTE — Telephone Encounter (Signed)
ok 

## 2012-11-20 NOTE — Telephone Encounter (Signed)
Pt advised via personal VM 

## 2012-11-27 ENCOUNTER — Other Ambulatory Visit: Payer: Self-pay | Admitting: Internal Medicine

## 2012-12-04 ENCOUNTER — Ambulatory Visit: Payer: Medicare Other | Admitting: Internal Medicine

## 2012-12-05 ENCOUNTER — Other Ambulatory Visit: Payer: Self-pay | Admitting: Internal Medicine

## 2012-12-06 ENCOUNTER — Other Ambulatory Visit (INDEPENDENT_AMBULATORY_CARE_PROVIDER_SITE_OTHER): Payer: Medicare Other

## 2012-12-06 ENCOUNTER — Encounter: Payer: Self-pay | Admitting: Internal Medicine

## 2012-12-06 ENCOUNTER — Ambulatory Visit (INDEPENDENT_AMBULATORY_CARE_PROVIDER_SITE_OTHER): Payer: Medicare Other | Admitting: Internal Medicine

## 2012-12-06 VITALS — BP 110/78 | HR 101 | Temp 97.5°F | Wt 293.0 lb

## 2012-12-06 LAB — POCT URINALYSIS DIPSTICK
Glucose, UA: NEGATIVE
Nitrite, UA: NEGATIVE
Protein, UA: 30
Spec Grav, UA: <=1.005
Urobilinogen, UA: 0.2
pH, UA: 5

## 2012-12-06 LAB — URINALYSIS, ROUTINE W REFLEX MICROSCOPIC
Ketones, ur: NEGATIVE
Total Protein, Urine: NEGATIVE
Urine Glucose: NEGATIVE
Urobilinogen, UA: 0.2 (ref 0.0–1.0)

## 2012-12-06 NOTE — Progress Notes (Signed)
Subjective:    [name], female    DOB: 02-08-1966, 47 y.o.   MRN: [id number]  Flank Pain This is a new problem. The current episode started more than 1 month ago. The problem occurs constantly. The problem has been gradually worsening since onset. The pain is present in the sacro-iliac. The quality of the pain is described as aching, burning and shooting. The pain radiates to the right thigh, right foot and right knee. The pain is moderate. The pain is the same all the time. The symptoms are aggravated by twisting and lying down. Stiffness is present at night. Associated symptoms include dysuria and leg pain. Pertinent negatives include no abdominal pain, bladder incontinence, chest pain, fever, pelvic pain, perianal numbness, weakness or weight loss. Risk factors include obesity and lack of exercise. She has tried analgesics and muscle relaxant for the symptoms. The treatment provided mild relief.    Past Medical History  Diagnosis Date  . Sleep apnea     noncompliant w/ CPAP  . Hypertension   . Hyperlipidemia   . Chronic headache   . Fibromyalgia     daily narcotics  . Anxiety     hx chronic BZ use, stopped 07/2010  . Anemia   . Pulmonary sarcoidosis     unimpressive CT chest 2011  . Colonic polyp   . GERD (gastroesophageal reflux disease)   . ALLERGIC RHINITIS   . Asthma   . CHF (congestive heart failure)     Diastolic with fluid overload, May, 2012, LVEF 60%  . Morbid obesity   . Depression   . Panic attacks   . Diabetes mellitus   . COPD (chronic obstructive pulmonary disease)     on home O2, moderate airflow obstruction, suspect d/t emphysema  . Obesity   . Elevated LFTs 09/2011  . Ovarian cyst   . Chronic back pain     Review of Systems  Constitutional: Negative for fever and weight loss.  Cardiovascular: Negative for chest pain.  Gastrointestinal: Negative for abdominal pain.  Genitourinary: Positive for dysuria and flank pain. Negative for  bladder incontinence and pelvic pain.  Neurological: Negative for weakness.       Objective:   Physical Exam BP 110/78  Pulse 101  Temp(Src) 97.5 F (36.4 C) (Oral)  Wt 293 lb (132.904 kg)  BMI 43.9 kg/m2  SpO2 93% Wt Readings from Last 3 Encounters:  12/06/12 293 lb (132.904 kg)  11/18/12 294 lb 8 oz (133.584 kg)  11/14/12 291 lb 6.4 oz (132.178 kg)   Constitutional: She is obese, wearing nasal cannula oxygen and appears well-developed and well-nourished. No distress.   Neck: Normal range of motion. Neck supple. No JVD present. No thyromegaly present.  Cardiovascular: Normal rate, regular rhythm and normal heart sounds.  No murmur heard. No BLE edema. Pulmonary/Chest: Effort normal and breath sounds normal. No respiratory distress. She has no wheezes.  Abdominal: Soft. Bowel sounds are normal. She exhibits no distension. There is no tenderness. no masses -mild right CVA tenderness to palpation Musculoskeletal: Back: full range of motion of thoracic and lumbar spine. Non tender to palpation. Positive ipsilateral straight leg raise on right. DTR's are symmetrically intact. Sensation intact in all dermatomes of the lower extremities. Full strength to manual muscle testing. patient is able to heel toe walk without difficulty and ambulates with antalgic gait. Skin: Skin is warm and dry. No rash noted. No erythema.  Psychiatric: She has a normal mood and affect. Her behavior is  normal. Judgment and thought content normal.   Lab Results  Component Value Date   WBC 8.8 10/21/2012   HGB 15.1* 10/21/2012   HCT 46.0 10/21/2012   PLT 184 10/21/2012   GLUCOSE 92 10/21/2012   CHOL 255* 02/13/2012   TRIG 133.0 02/13/2012   HDL 54.30 02/13/2012   LDLDIRECT 186.4 02/13/2012   LDLCALC  Value: 92     03/07/2010   ALT 40* 10/21/2012   AST 35 10/21/2012   NA 137 10/21/2012   K 4.0 10/21/2012   CL 95* 10/21/2012   CREATININE 0.96 10/21/2012   BUN 11 10/21/2012   CO2 30 10/21/2012   TSH 0.701 04/22/2012    HGBA1C 5.9* 09/01/2012   POC urine - mod blood     Assessment & Plan:   Right flank pain with increase in chronic sciatic symptoms - Status post evaluation by neurosurgery for same and declines repeat surgical intervention Continue pain control of the osseous ongoing, renew muscle relaxers and refer to physical therapy -neuro muscular exam otherwise intact with good motor strength Patient to call if symptoms worse or unimproved, will hold on prednisone given for response to same in past  Microscopic hematuria, possible UTI symptoms -send for urine culture to confirm same, hold empiric antibodies pending these results. Patient advised to push hydration

## 2012-12-06 NOTE — Assessment & Plan Note (Signed)
ER visit for RLE numbness and pain  01/2011 and again 06/2011> MRI with free fragment and encroachment on L4 root -  status post R L4-5 microdiscectomy 07/06/2011 > symptoms improved but not resolved Repeat neurosurgical evaluation 2013 and February 2014, patient declines repeat surgical intervention at this time Refer to PT and continue oxy as needed- reviewed chronic pain mgmt as ongoing

## 2012-12-06 NOTE — Assessment & Plan Note (Signed)
Pt reports interest in lap band - to follow up with bariatric meeting seminars - encouraged to work on diet and exertion efforts as tolerated until then  Wt Readings from Last 3 Encounters:  12/06/12 293 lb (132.904 kg)  11/18/12 294 lb 8 oz (133.584 kg)  11/14/12 291 lb 6.4 oz (132.178 kg)

## 2012-12-06 NOTE — Telephone Encounter (Signed)
Faxed script back to [facility].../lmb 

## 2012-12-06 NOTE — Patient Instructions (Signed)
It was good to see you today. Will send urine for culture as discussed - if evidence of urine infection, we will call in antibiotics as discussed. Until then push fluids and hydration and call if symptoms worse Will refer to physical therapy for your sciatica and back pain as discussed Until then, continue pain medications and muscle relaxers as ongoing -will delay followup surgery for as long as we can tolerate

## 2012-12-07 MED ORDER — AMPICILLIN 500 MG PO CAPS: 500.0000 mg | ORAL_CAPSULE | Freq: Four times a day (QID) | ORAL | Status: DC

## 2012-12-07 NOTE — Addendum Note (Signed)
Addended by: Rene Paci A on: 12/07/2012 09:47 AM   Modules accepted: Orders

## 2012-12-09 LAB — URINE CULTURE: Colony Count: >=100000

## 2012-12-09 MED ORDER — LEVOFLOXACIN 500 MG PO TABS: 500.0000 mg | ORAL_TABLET | Freq: Every day | ORAL | Status: DC

## 2012-12-09 NOTE — Addendum Note (Signed)
Addended by: Rene Paci A on: 12/09/2012 01:27 PM   Modules accepted: Orders, Medications

## 2012-12-16 ENCOUNTER — Other Ambulatory Visit: Payer: Self-pay

## 2012-12-16 MED ORDER — OXYCODONE HCL 10 MG PO TABS: 5.0000 mg | ORAL_TABLET | Freq: Three times a day (TID) | ORAL | Status: DC | PRN

## 2012-12-16 NOTE — Telephone Encounter (Signed)
Pt is also requesting a refill of Diflucan

## 2012-12-16 NOTE — Telephone Encounter (Signed)
Pt also req refill for oxycodone 10mg . Please call pt if this is ok

## 2012-12-17 NOTE — Telephone Encounter (Signed)
Pt informed per [place] TL, Rx in cabinet for pt pick up

## 2012-12-21 ENCOUNTER — Other Ambulatory Visit: Payer: Self-pay | Admitting: Internal Medicine

## 2012-12-24 ENCOUNTER — Other Ambulatory Visit: Payer: Self-pay | Admitting: Internal Medicine

## 2012-12-26 ENCOUNTER — Other Ambulatory Visit: Payer: Self-pay | Admitting: Pulmonary Disease

## 2012-12-30 ENCOUNTER — Ambulatory Visit: Payer: Medicare Other | Admitting: Pulmonary Disease

## 2013-01-05 ENCOUNTER — Emergency Department (HOSPITAL_COMMUNITY): Payer: PRIVATE HEALTH INSURANCE

## 2013-01-05 ENCOUNTER — Encounter (HOSPITAL_COMMUNITY): Payer: Self-pay | Admitting: *Deleted

## 2013-01-05 ENCOUNTER — Inpatient Hospital Stay (HOSPITAL_COMMUNITY)
Admission: EM | Admit: 2013-01-05 | Discharge: 2013-01-10 | DRG: 189 | Disposition: A | Discharge status: Home / Self Care | Payer: PRIVATE HEALTH INSURANCE | Attending: Internal Medicine | Admitting: Internal Medicine

## 2013-01-05 DIAGNOSIS — Z6841 Body Mass Index (BMI) 40.0 and over, adult: Secondary | ICD-10-CM

## 2013-01-05 DIAGNOSIS — I10 Essential (primary) hypertension: Secondary | ICD-10-CM | POA: Diagnosis present

## 2013-01-05 DIAGNOSIS — F411 Generalized anxiety disorder: Secondary | ICD-10-CM | POA: Diagnosis present

## 2013-01-05 DIAGNOSIS — E1165 Type 2 diabetes mellitus with hyperglycemia: Secondary | ICD-10-CM

## 2013-01-05 DIAGNOSIS — E876 Hypokalemia: Secondary | ICD-10-CM

## 2013-01-05 DIAGNOSIS — Z720 Tobacco use: Secondary | ICD-10-CM

## 2013-01-05 DIAGNOSIS — IMO0001 Reserved for inherently not codable concepts without codable children: Secondary | ICD-10-CM | POA: Diagnosis present

## 2013-01-05 DIAGNOSIS — F17200 Nicotine dependence, unspecified, uncomplicated: Secondary | ICD-10-CM | POA: Diagnosis present

## 2013-01-05 DIAGNOSIS — J309 Allergic rhinitis, unspecified: Secondary | ICD-10-CM | POA: Diagnosis present

## 2013-01-05 DIAGNOSIS — Z91199 Patient's noncompliance with other medical treatment and regimen due to unspecified reason: Secondary | ICD-10-CM

## 2013-01-05 DIAGNOSIS — Z9981 Dependence on supplemental oxygen: Secondary | ICD-10-CM

## 2013-01-05 DIAGNOSIS — J441 Chronic obstructive pulmonary disease with (acute) exacerbation: Secondary | ICD-10-CM | POA: Diagnosis present

## 2013-01-05 DIAGNOSIS — F329 Major depressive disorder, single episode, unspecified: Secondary | ICD-10-CM | POA: Diagnosis present

## 2013-01-05 DIAGNOSIS — Z9119 Patient's noncompliance with other medical treatment and regimen: Secondary | ICD-10-CM

## 2013-01-05 DIAGNOSIS — Z794 Long term (current) use of insulin: Secondary | ICD-10-CM

## 2013-01-05 DIAGNOSIS — F3289 Other specified depressive episodes: Secondary | ICD-10-CM | POA: Diagnosis present

## 2013-01-05 DIAGNOSIS — J449 Chronic obstructive pulmonary disease, unspecified: Secondary | ICD-10-CM

## 2013-01-05 DIAGNOSIS — J9620 Acute and chronic respiratory failure, unspecified whether with hypoxia or hypercapnia: Principal | ICD-10-CM

## 2013-01-05 DIAGNOSIS — I509 Heart failure, unspecified: Secondary | ICD-10-CM | POA: Diagnosis present

## 2013-01-05 DIAGNOSIS — Z881 Allergy status to other antibiotic agents status: Secondary | ICD-10-CM

## 2013-01-05 DIAGNOSIS — I5032 Chronic diastolic (congestive) heart failure: Secondary | ICD-10-CM | POA: Diagnosis present

## 2013-01-05 DIAGNOSIS — K219 Gastro-esophageal reflux disease without esophagitis: Secondary | ICD-10-CM | POA: Diagnosis present

## 2013-01-05 DIAGNOSIS — IMO0002 Reserved for concepts with insufficient information to code with codable children: Secondary | ICD-10-CM | POA: Diagnosis present

## 2013-01-05 DIAGNOSIS — E785 Hyperlipidemia, unspecified: Secondary | ICD-10-CM

## 2013-01-05 DIAGNOSIS — R55 Syncope and collapse: Secondary | ICD-10-CM

## 2013-01-05 DIAGNOSIS — Z79899 Other long term (current) drug therapy: Secondary | ICD-10-CM

## 2013-01-05 DIAGNOSIS — G4733 Obstructive sleep apnea (adult) (pediatric): Secondary | ICD-10-CM | POA: Diagnosis present

## 2013-01-05 LAB — CBC WITH DIFFERENTIAL/PLATELET
Eosinophils Absolute: 0.3 10*3/uL (ref 0.0–0.7)
Hemoglobin: 13.9 g/dL (ref 12.0–15.0)
Lymphocytes Relative: 21 % (ref 12–46)
Lymphs Abs: 1.8 10*3/uL (ref 0.7–4.0)
Monocytes Relative: 5 % (ref 3–12)
Neutro Abs: 5.7 10*3/uL (ref 1.7–7.7)
Neutrophils Relative %: 70 % (ref 43–77)
Platelets: 187 10*3/uL (ref 150–400)
RBC: 5.32 MIL/uL — ABNORMAL HIGH (ref 3.87–5.11)
WBC: 8.2 10*3/uL (ref 4.0–10.5)

## 2013-01-05 LAB — CBC
HCT: 43.6 % (ref 36.0–46.0)
MCV: 80.6 fL (ref 78.0–100.0)
Platelets: 184 10*3/uL (ref 150–400)
RBC: 5.41 MIL/uL — ABNORMAL HIGH (ref 3.87–5.11)
RDW: 15.3 % (ref 11.5–15.5)
WBC: 8.7 10*3/uL (ref 4.0–10.5)

## 2013-01-05 LAB — POCT I-STAT TROPONIN I: Troponin i, poc: 0 ng/mL (ref 0.00–0.08)

## 2013-01-05 LAB — GLUCOSE, CAPILLARY
Glucose-Capillary: 188 mg/dL — ABNORMAL HIGH (ref 70–99)
Glucose-Capillary: 254 mg/dL — ABNORMAL HIGH (ref 70–99)
Glucose-Capillary: 273 mg/dL — ABNORMAL HIGH (ref 70–99)

## 2013-01-05 LAB — URINALYSIS, ROUTINE W REFLEX MICROSCOPIC
Bilirubin Urine: NEGATIVE
Leukocytes, UA: NEGATIVE
Nitrite: NEGATIVE
Specific Gravity, Urine: 1.011 (ref 1.005–1.030)
Urobilinogen, UA: 0.2 mg/dL (ref 0.0–1.0)

## 2013-01-05 LAB — POCT I-STAT, CHEM 8
BUN: 10 mg/dL (ref 6–23)
Creatinine, Ser: 0.8 mg/dL (ref 0.50–1.10)
Potassium: 3.2 mEq/L — ABNORMAL LOW (ref 3.5–5.1)
Sodium: 138 mEq/L (ref 135–145)
TCO2: 31 mmol/L (ref 0–100)

## 2013-01-05 LAB — CREATININE, SERUM
GFR calc Af Amer: >90 mL/min (ref >90)
GFR calc non Af Amer: >90 mL/min (ref >90)

## 2013-01-05 LAB — PRO B NATRIURETIC PEPTIDE: Pro B Natriuretic peptide (BNP): 15 pg/mL (ref 0–125)

## 2013-01-05 MED ORDER — CYCLOBENZAPRINE HCL 10 MG PO TABS
5.0000 mg | ORAL_TABLET | Freq: Three times a day (TID) | ORAL | Status: DC | PRN
  Administered 2013-01-08: 5 mg via ORAL
  Filled 2013-01-05 (×2): qty 1

## 2013-01-05 MED ORDER — ALBUTEROL SULFATE (5 MG/ML) 0.5% IN NEBU
2.5000 mg | INHALATION_SOLUTION | RESPIRATORY_TRACT | Status: DC | PRN
  Administered 2013-01-05: 2.5 mg via RESPIRATORY_TRACT

## 2013-01-05 MED ORDER — METHYLPREDNISOLONE SODIUM SUCC 125 MG IJ SOLR
80.0000 mg | Freq: Three times a day (TID) | INTRAMUSCULAR | Status: DC
  Administered 2013-01-05: 80 mg via INTRAVENOUS
  Administered 2013-01-05: 10:00:00 via INTRAVENOUS
  Administered 2013-01-05 – 2013-01-09 (×11): 80 mg via INTRAVENOUS
  Filled 2013-01-05 (×16): qty 1.28

## 2013-01-05 MED ORDER — NICOTINE 14 MG/24HR TD PT24
14.0000 mg | MEDICATED_PATCH | Freq: Every day | TRANSDERMAL | Status: DC
  Administered 2013-01-05 – 2013-01-10 (×6): 14 mg via TRANSDERMAL
  Filled 2013-01-05 (×7): qty 1

## 2013-01-05 MED ORDER — ALBUTEROL SULFATE (5 MG/ML) 0.5% IN NEBU
2.5000 mg | INHALATION_SOLUTION | Freq: Four times a day (QID) | RESPIRATORY_TRACT | Status: DC
  Administered 2013-01-05 – 2013-01-10 (×20): 2.5 mg via RESPIRATORY_TRACT
  Filled 2013-01-05 (×22): qty 0.5

## 2013-01-05 MED ORDER — ACETAMINOPHEN 325 MG PO TABS: 650.0000 mg | ORAL_TABLET | Freq: Four times a day (QID) | ORAL | Status: DC | PRN

## 2013-01-05 MED ORDER — INSULIN ASPART 100 UNIT/ML ~~LOC~~ SOLN
0.0000 [IU] | Freq: Three times a day (TID) | SUBCUTANEOUS | Status: DC
  Administered 2013-01-05 – 2013-01-06 (×3): 11 [IU] via SUBCUTANEOUS
  Administered 2013-01-06: 15 [IU] via SUBCUTANEOUS
  Administered 2013-01-06 – 2013-01-07 (×2): 11 [IU] via SUBCUTANEOUS
  Administered 2013-01-07: 20 [IU] via SUBCUTANEOUS
  Administered 2013-01-07 – 2013-01-08 (×2): 11 [IU] via SUBCUTANEOUS
  Administered 2013-01-08: 7 [IU] via SUBCUTANEOUS
  Administered 2013-01-08: 11 [IU] via SUBCUTANEOUS
  Administered 2013-01-09: 7 [IU] via SUBCUTANEOUS
  Administered 2013-01-09: 11 [IU] via SUBCUTANEOUS
  Administered 2013-01-09: 7 [IU] via SUBCUTANEOUS
  Administered 2013-01-10: 4 [IU] via SUBCUTANEOUS
  Administered 2013-01-10: 3 [IU] via SUBCUTANEOUS

## 2013-01-05 MED ORDER — INSULIN ASPART 100 UNIT/ML ~~LOC~~ SOLN
6.0000 [IU] | Freq: Three times a day (TID) | SUBCUTANEOUS | Status: DC
  Administered 2013-01-05 – 2013-01-10 (×14): 6 [IU] via SUBCUTANEOUS

## 2013-01-05 MED ORDER — LORATADINE 10 MG PO TABS
10.0000 mg | ORAL_TABLET | Freq: Every day | ORAL | Status: DC
  Administered 2013-01-05 – 2013-01-10 (×6): 10 mg via ORAL
  Filled 2013-01-05 (×6): qty 1

## 2013-01-05 MED ORDER — SODIUM CHLORIDE 0.9 % IJ SOLN
3.0000 mL | Freq: Two times a day (BID) | INTRAMUSCULAR | Status: DC
  Administered 2013-01-05 – 2013-01-09 (×5): 3 mL via INTRAVENOUS

## 2013-01-05 MED ORDER — TORSEMIDE 100 MG PO TABS
100.0000 mg | ORAL_TABLET | Freq: Two times a day (BID) | ORAL | Status: DC
  Administered 2013-01-05 – 2013-01-08 (×8): 100 mg via ORAL
  Filled 2013-01-05 (×11): qty 1

## 2013-01-05 MED ORDER — ONDANSETRON HCL 4 MG PO TABS: 4.0000 mg | ORAL_TABLET | Freq: Four times a day (QID) | ORAL | Status: DC | PRN

## 2013-01-05 MED ORDER — SODIUM CHLORIDE 0.9 % IJ SOLN
3.0000 mL | Freq: Two times a day (BID) | INTRAMUSCULAR | Status: DC
  Administered 2013-01-05 – 2013-01-08 (×7): 3 mL via INTRAVENOUS

## 2013-01-05 MED ORDER — PANTOPRAZOLE SODIUM 40 MG PO TBEC
40.0000 mg | DELAYED_RELEASE_TABLET | Freq: Every day | ORAL | Status: DC
  Administered 2013-01-05 – 2013-01-07 (×3): 40 mg via ORAL
  Filled 2013-01-05 (×4): qty 1

## 2013-01-05 MED ORDER — OXYCODONE HCL 5 MG PO TABS
5.0000 mg | ORAL_TABLET | ORAL | Status: DC | PRN
  Administered 2013-01-05 – 2013-01-10 (×20): 5 mg via ORAL
  Filled 2013-01-05 (×20): qty 1

## 2013-01-05 MED ORDER — ONDANSETRON HCL 4 MG/2ML IJ SOLN: 4.0000 mg | Freq: Four times a day (QID) | INTRAMUSCULAR | Status: DC | PRN

## 2013-01-05 MED ORDER — SPIRONOLACTONE 25 MG PO TABS
25.0000 mg | ORAL_TABLET | Freq: Two times a day (BID) | ORAL | Status: DC
  Administered 2013-01-05 – 2013-01-09 (×9): 25 mg via ORAL
  Filled 2013-01-05 (×10): qty 1

## 2013-01-05 MED ORDER — ENOXAPARIN SODIUM 40 MG/0.4ML ~~LOC~~ SOLN
40.0000 mg | SUBCUTANEOUS | Status: DC
  Administered 2013-01-05 – 2013-01-10 (×6): 40 mg via SUBCUTANEOUS
  Filled 2013-01-05 (×7): qty 0.4

## 2013-01-05 MED ORDER — IPRATROPIUM BROMIDE 0.02 % IN SOLN
0.5000 mg | Freq: Four times a day (QID) | RESPIRATORY_TRACT | Status: DC
  Administered 2013-01-05 – 2013-01-10 (×20): 0.5 mg via RESPIRATORY_TRACT
  Filled 2013-01-05 (×23): qty 2.5

## 2013-01-05 MED ORDER — GUAIFENESIN-DM 100-10 MG/5ML PO SYRP
5.0000 mL | ORAL_SOLUTION | ORAL | Status: DC | PRN
  Administered 2013-01-05 – 2013-01-08 (×6): 5 mL via ORAL
  Filled 2013-01-05 (×6): qty 5

## 2013-01-05 MED ORDER — SODIUM CHLORIDE 0.9 % IV SOLN: 250.0000 mL | INTRAVENOUS | Status: DC | PRN

## 2013-01-05 MED ORDER — GLIMEPIRIDE 1 MG PO TABS
1.0000 mg | ORAL_TABLET | Freq: Every day | ORAL | Status: DC
  Administered 2013-01-06 – 2013-01-09 (×4): 1 mg via ORAL
  Filled 2013-01-05 (×5): qty 1

## 2013-01-05 MED ORDER — ACETAMINOPHEN 650 MG RE SUPP: 650.0000 mg | Freq: Four times a day (QID) | RECTAL | Status: DC | PRN

## 2013-01-05 MED ORDER — POTASSIUM CHLORIDE CRYS ER 20 MEQ PO TBCR
20.0000 meq | EXTENDED_RELEASE_TABLET | Freq: Two times a day (BID) | ORAL | Status: DC
  Administered 2013-01-05 – 2013-01-09 (×9): 20 meq via ORAL
  Filled 2013-01-05 (×5): qty 1
  Filled 2013-01-05: qty 2
  Filled 2013-01-05 (×5): qty 1

## 2013-01-05 MED ORDER — ALPRAZOLAM 0.5 MG PO TABS
1.0000 mg | ORAL_TABLET | Freq: Two times a day (BID) | ORAL | Status: DC | PRN
  Administered 2013-01-05 – 2013-01-10 (×8): 1 mg via ORAL
  Filled 2013-01-05: qty 2
  Filled 2013-01-05 (×2): qty 1
  Filled 2013-01-05 (×2): qty 2
  Filled 2013-01-05: qty 1
  Filled 2013-01-05: qty 2
  Filled 2013-01-05: qty 1
  Filled 2013-01-05 (×2): qty 2

## 2013-01-05 MED ORDER — POTASSIUM CHLORIDE CRYS ER 20 MEQ PO TBCR
20.0000 meq | EXTENDED_RELEASE_TABLET | Freq: Once | ORAL | Status: AC
  Administered 2013-01-05: 20 meq via ORAL

## 2013-01-05 MED ORDER — TIOTROPIUM BROMIDE MONOHYDRATE 18 MCG IN CAPS: 18.0000 ug | ORAL_CAPSULE | Freq: Every day | RESPIRATORY_TRACT | Status: DC

## 2013-01-05 MED ORDER — INSULIN GLARGINE 100 UNIT/ML ~~LOC~~ SOLN
16.0000 [IU] | Freq: Every day | SUBCUTANEOUS | Status: DC
  Administered 2013-01-05 – 2013-01-08 (×4): 16 [IU] via SUBCUTANEOUS
  Filled 2013-01-05 (×6): qty 0.16

## 2013-01-05 MED ORDER — BUDESONIDE-FORMOTEROL FUMARATE 160-4.5 MCG/ACT IN AERO
2.0000 | INHALATION_SPRAY | Freq: Two times a day (BID) | RESPIRATORY_TRACT | Status: DC
  Administered 2013-01-06 – 2013-01-10 (×9): 2 via RESPIRATORY_TRACT
  Filled 2013-01-05 (×2): qty 6

## 2013-01-05 MED ORDER — SERTRALINE HCL 25 MG PO TABS
25.0000 mg | ORAL_TABLET | Freq: Every day | ORAL | Status: DC
  Administered 2013-01-05 – 2013-01-09 (×5): 25 mg via ORAL
  Filled 2013-01-05 (×5): qty 1

## 2013-01-05 MED ORDER — SODIUM CHLORIDE 0.9 % IJ SOLN: 3.0000 mL | INTRAMUSCULAR | Status: DC | PRN

## 2013-01-05 NOTE — H&P (Signed)
[facility]          History and Physical    PCP:   Rene Paci, MD   Chief Complaint:  Progressive shortness of breath  HPI: Patient is a morbidly obese 47 year old black woman has a past medical history significant for COPD on chronic oxygen, continued tobacco abuse, obstructive sleep apnea, who presents with the above-mentioned complaints. His current episode started about 5 days ago. She has subsequently become more and more short of breath. Last night she had to wake up in the middle of the night to go to the bathroom; when she came back had extreme shortness of breath to the point where she thought she was almost going to pass out. There was no actual loss of consciousness. She called EMS who brought her into the hospital where she was found to have active wheezing on chest auscultation. A chest x-ray did not show any acute findings. She continued to wheeze despite treatments given in the emergency department, hence, we were called to admit her for further evaluation and management.  Allergies:   Allergies  Allergen Reactions  . Ciprofloxacin     REACTION: nausea  . Latex     REACTION: rash, burning  . Sulfamethoxazole W-Trimethoprim     Bactrim REACTION: Projectile Vomiting  . Azithromycin     REACTION: resistant  . Doxycycline     REACTION: restistant  . Metformin And Related Other (See Comments)    Severe diarrhea  . Metronidazole Nausea And Vomiting      Past Medical History  Diagnosis Date  . Sleep apnea     noncompliant w/ CPAP  . Hypertension   . Hyperlipidemia   . Chronic headache   . Fibromyalgia     daily narcotics  . Anxiety     hx chronic BZ use, stopped 07/2010  . Anemia   . Pulmonary sarcoidosis     unimpressive CT chest 2011  . Colonic polyp   . GERD (gastroesophageal reflux disease)   . ALLERGIC RHINITIS   . Asthma   . CHF (congestive heart failure)     Diastolic with fluid overload, May, 2012, LVEF 60%  . Morbid  obesity   . Depression   . Panic attacks   . Diabetes mellitus   . COPD (chronic obstructive pulmonary disease)     on home O2, moderate airflow obstruction, suspect d/t emphysema  . Obesity   . Elevated LFTs 09/2011  . Ovarian cyst   . Chronic back pain     Past Surgical History  Procedure Laterality Date  . Polypectomy  2011  . Lumbar microdiscectomy  07/06/2011    R L4-5, stern  . Back surgery    . Carpal tunnel release    . Steroid spinal injections      Prior to Admission medications   Medication Sig Start Date End Date Taking? Authorizing Provider  albuterol (PROVENTIL HFA;VENTOLIN HFA) 108 (90 BASE) MCG/ACT inhaler Inhale 2 puffs into the lungs every 6 (six) hours as needed. For wheeze or shortness of breath 09/04/12  Yes Richarda Overlie, MD  albuterol (PROVENTIL) (2.5 MG/3ML) 0.083% nebulizer solution Take 2.5 mg by nebulization every 6 (six) hours as needed for wheezing or shortness of breath.   Yes Historical Provider, MD  ALPRAZolam Prudy Feeler) 1 MG tablet Take 1 mg by mouth 2 (two) times daily as needed for sleep or anxiety.   Yes Historical Provider, MD  budesonide-formoterol (SYMBICORT) 160-4.5 MCG/ACT inhaler Inhale 2 puffs into the lungs 2 (  two) times daily. 09/04/12  Yes Richarda Overlie, MD  cetirizine (ZYRTEC) 10 MG tablet Take 10 mg by mouth daily.   Yes Historical Provider, MD  cyclobenzaprine (FLEXERIL) 5 MG tablet Take 5 mg by mouth every 8 (eight) hours as needed for muscle spasms.   Yes Historical Provider, MD  esomeprazole (NEXIUM) 40 MG capsule Take 40 mg by mouth 2 (two) times daily.   Yes Historical Provider, MD  glimepiride (AMARYL) 1 MG tablet Take 1 mg by mouth daily before breakfast.   Yes Historical Provider, MD  insulin glargine (LANTUS) 100 UNIT/ML injection Inject 16 Units into the skin at bedtime.    Yes Historical Provider, MD  mometasone (NASONEX) 50 MCG/ACT nasal spray Place 2 sprays into the nose daily.   Yes Historical Provider, MD  Oxycodone HCl 10 MG  TABS Take 5-10 mg by mouth 3 (three) times daily as needed (for pain). 12/16/12  Yes Newt Lukes, MD  potassium chloride SA (K-DUR,KLOR-CON) 20 MEQ tablet Take 20 mEq by mouth 2 (two) times daily.  05/26/11  Yes Newt Lukes, MD  sertraline (ZOLOFT) 25 MG tablet Take 25 mg by mouth daily.   Yes Historical Provider, MD  spironolactone (ALDACTONE) 25 MG tablet Take 25 mg by mouth 2 (two) times daily.   Yes Historical Provider, MD  tiotropium (SPIRIVA) 18 MCG inhalation capsule Place 18 mcg into inhaler and inhale daily.   Yes Historical Provider, MD  torsemide (DEMADEX) 100 MG tablet Take 1 tablet (100 mg total) by mouth 2 (two) times daily. 09/16/12  Yes Newt Lukes, MD    Social History:  reports that she has been smoking Cigarettes.  She has a 29 pack-year smoking history. She has never used smokeless tobacco. She reports that  drinks alcohol. She reports that she does not use illicit drugs.  Family History  Problem Relation Age of Onset  . Hypertension Mother   . Emphysema Father   . Hypertension Father   . Stomach cancer Father   . Allergies Brother   . Hypertension Brother   . Stomach cancer Brother   . Heart disease Mother   . Heart disease Father   . Heart disease Brother     Review of Systems:  Constitutional: Denies fever, chills, diaphoresis, appetite change and fatigue.  HEENT: Denies photophobia, eye pain, redness, hearing loss, ear pain, congestion, sore throat, rhinorrhea, sneezing, mouth sores, trouble swallowing, neck pain, neck stiffness and tinnitus.   Respiratory: Denies  chest tightness. Cardiovascular: Denies chest pain, palpitations and leg swelling.  Gastrointestinal: Denies nausea, vomiting, abdominal pain, diarrhea, constipation, blood in stool and abdominal distention.  Genitourinary: Denies dysuria, urgency, frequency, hematuria, flank pain and difficulty urinating.  Musculoskeletal: Denies myalgias, back pain, joint swelling, arthralgias  and gait problem.  Skin: Denies pallor, rash and wound.  Neurological: Denies dizziness, seizures, syncope, weakness, light-headedness, numbness and headaches.  Hematological: Denies adenopathy. Easy bruising, personal or family bleeding history  Psychiatric/Behavioral: Denies suicidal ideation, mood changes, confusion, nervousness, sleep disturbance and agitation   Physical Exam: Blood pressure 129/76, pulse 86, temperature 98.2 F (36.8 C), temperature source Oral, resp. rate 19, SpO2 95.00%. General: Alert, awake, morbidly obese, lying in bed talking on the phone. You can hear her wheezing even without a stethoscope. HEENT: Normocephalic, atraumatic, pupils equal round and reactive to light, extraocular movements intact, moist mucous membranes. Neck: Supple, no JVD, no lymphadenopathy, no bruits, no goiter. Cardiovascular: Regular rate and rhythm, no murmurs, rubs or gallops. Lungs: Diffuse bilateral  expiratory wheezing, no rhonchi. Abdomen: Obese, soft, nontender, nondistended, positive bowel sounds, no masses or organomegaly noted. Extremities: Trace bilateral pitting edema, positive pedal pulses. Neurologic: Grossly intact and nonfocal.  Labs on Admission:  Results for orders placed during the hospital encounter of 01/05/13 (from the past 48 hour(s))  CBC WITH DIFFERENTIAL     Status: Abnormal   Collection Time    01/05/13  4:55 AM      Result Value Range   WBC 8.2  4.0 - 10.5 K/uL   RBC 5.32 (*) 3.87 - 5.11 MIL/uL   Hemoglobin 13.9  12.0 - 15.0 g/dL   HCT 16.1  09.6 - 04.5 %   MCV 79.5  78.0 - 100.0 fL   MCH 26.1  26.0 - 34.0 pg   MCHC 32.9  30.0 - 36.0 g/dL   RDW 40.9  81.1 - 91.4 %   Platelets 187  150 - 400 K/uL   Neutrophils Relative 70  43 - 77 %   Neutro Abs 5.7  1.7 - 7.7 K/uL   Lymphocytes Relative 21  12 - 46 %   Lymphs Abs 1.8  0.7 - 4.0 K/uL   Monocytes Relative 5  3 - 12 %   Monocytes Absolute 0.4  0.1 - 1.0 K/uL   Eosinophils Relative 4  0 - 5 %    Eosinophils Absolute 0.3  0.0 - 0.7 K/uL   Basophils Relative 1  0 - 1 %   Basophils Absolute 0.0  0.0 - 0.1 K/uL  PRO B NATRIURETIC PEPTIDE     Status: None   Collection Time    01/05/13  4:55 AM      Result Value Range   Pro B Natriuretic peptide (BNP) 15.0  0 - 125 pg/mL  POCT I-STAT TROPONIN I     Status: None   Collection Time    01/05/13  5:21 AM      Result Value Range   Troponin i, poc 0.00  0.00 - 0.08 ng/mL   Comment 3            Comment: Due to the release kinetics of cTnI,     a negative result within the first hours     of the onset of symptoms does not rule out     myocardial infarction with certainty.     If myocardial infarction is still suspected,     repeat the test at appropriate intervals.  POCT I-STAT, CHEM 8     Status: Abnormal   Collection Time    01/05/13  5:24 AM      Result Value Range   Sodium 138  135 - 145 mEq/L   Potassium 3.2 (*) 3.5 - 5.1 mEq/L   Chloride 99  96 - 112 mEq/L   BUN 10  6 - 23 mg/dL   Creatinine, Ser 7.82  0.50 - 1.10 mg/dL   Glucose, Bld 956 (*) 70 - 99 mg/dL   Calcium, Ion 2.13  0.86 - 1.23 mmol/L   TCO2 31  0 - 100 mmol/L   Hemoglobin 15.6 (*) 12.0 - 15.0 g/dL   HCT 57.8  46.9 - 62.9 %  GLUCOSE, CAPILLARY     Status: Abnormal   Collection Time    01/05/13  8:02 AM      Result Value Range   Glucose-Capillary 188 (*) 70 - 99 mg/dL   Comment 1 Notify RN      Radiological Exams on Admission: Dg Chest 2 View  01/05/2013  *RADIOLOGY REPORT*  Clinical Data: Syncope.  Dyspnea.  COPD.  Asthma.  Sarcoidosis.  CHEST - 2 VIEW  Comparison: 10/21/2012  Findings: Mild cardiomegaly is stable.  Chronic interstitial lung disease and bibasilar scarring also stable in appearance.  No evidence of acute or superimposed infiltrate.  No evidence of pleural effusion.  IMPRESSION: Stable interstitial lung disease and bibasilar scarring. No acute findings.   Original Report Authenticated By: Myles Rosenthal, M.D.     Assessment/Plan Principal  Problem:   Acute-on-chronic respiratory failure Active Problems:   Tobacco abuse   Morbid obesity   COPD with acute exacerbation   Acute on chronic respiratory failure -Secondary to COPD with acute exacerbation. -See below for details.  COPD with acute exacerbation -She has audible wheezes and difficulty breathing. -Will admit to a telemetry unit, will give IV steroids as well as nebulizer treatments. -We'll hold her spiriva while she is getting Atrovent, please remember to restart spiriva on discharge. -Do not believe that infection such as bronchitis or pneumonia is the precipitating event due to lack of subjective and objective findings. She has multiple antibiotic allergies. Will not start an antibiotic at this time.  Tobacco abuse -She has been extensively counseled. -Nicotine patch while in the hospital.  DVT prophylaxis -Lovenox.   Time Spent on Admission: 75 minutes  HERNANDEZ ACOSTA,ESTELA [facility] Pager: [phone] 01/05/2013, 9:05 AM

## 2013-01-05 NOTE — ED Notes (Signed)
Patient used her neb prior to bed then when she got up she was not able to breath.

## 2013-01-05 NOTE — ED Provider Notes (Signed)
History     CSN: [id number]  Arrival date & time 01/05/13  0413   First MD Initiated Contact with Patient 01/05/13 0441      Chief Complaint  Patient presents with  . Shortness of Breath    (Consider location/radiation/quality/duration/timing/severity/associated sxs/prior treatment) Patient is a 47 y.o. female presenting with shortness of breath. The history is provided by the patient.  Shortness of Breath Associated symptoms: cough   Associated symptoms: no abdominal pain, no chest pain, no headaches, no rash and no vomiting    patient assessment acute on chronic shortness of breath. States she has had some cough and has been wheezing. No real sputum production. No chest pain. Patient states she is feeling short of breath tonight and then went out into her living room and passed out. She states she does not know how long she was unconscious. No chest pain. No confusion. She states she's not passed out like this before. She states she's been feeling worse over the last few days. No swelling or legs. She states she's been feeling bad overall recently.  Past Medical History  Diagnosis Date  . Sleep apnea     noncompliant w/ CPAP  . Hypertension   . Hyperlipidemia   . Chronic headache   . Fibromyalgia     daily narcotics  . Anxiety     hx chronic BZ use, stopped 07/2010  . Anemia   . Pulmonary sarcoidosis     unimpressive CT chest 2011  . Colonic polyp   . GERD (gastroesophageal reflux disease)   . ALLERGIC RHINITIS   . Asthma   . CHF (congestive heart failure)     Diastolic with fluid overload, May, 2012, LVEF 60%  . Morbid obesity   . Depression   . Panic attacks   . Diabetes mellitus   . COPD (chronic obstructive pulmonary disease)     on home O2, moderate airflow obstruction, suspect d/t emphysema  . Obesity   . Elevated LFTs 09/2011  . Ovarian cyst   . Chronic back pain     Past Surgical History  Procedure Laterality Date  . Polypectomy  2011  . Lumbar  microdiscectomy  07/06/2011    R L4-5, stern  . Back surgery    . Carpal tunnel release    . Steroid spinal injections      Family History  Problem Relation Age of Onset  . Hypertension Mother   . Emphysema Father   . Hypertension Father   . Stomach cancer Father   . Allergies Brother   . Hypertension Brother   . Stomach cancer Brother   . Heart disease Mother   . Heart disease Father   . Heart disease Brother     History  Substance Use Topics  . Smoking status: Current Every Day Smoker -- 1.00 packs/day for 29 years    Types: Cigarettes    Last Attempt to Quit: 09/14/2010  . Smokeless tobacco: Never Used     Comment: Resumed smoking September 2013 --2-3 CIGS DAILY AND E-CIG  . Alcohol Use: Yes     Comment: occasionally    OB History   Grav Para Term Preterm Abortions TAB SAB Ect Mult Living                  Review of Systems  Constitutional: Negative for activity change and appetite change.  HENT: Negative for neck stiffness.   Eyes: Negative for pain.  Respiratory: Positive for cough and shortness of breath.  Negative for chest tightness.   Cardiovascular: Negative for chest pain and leg swelling.  Gastrointestinal: Negative for nausea, vomiting, abdominal pain and diarrhea.  Genitourinary: Negative for flank pain.  Musculoskeletal: Positive for back pain.  Skin: Negative for rash.  Neurological: Positive for syncope. Negative for weakness, numbness and headaches.  Psychiatric/Behavioral: Negative for behavioral problems.    Allergies  Ciprofloxacin; Latex; Sulfamethoxazole w-trimethoprim; Azithromycin; Doxycycline; Metformin and related; and Metronidazole  Home Medications   Current Outpatient Rx  Name  Route  Sig  Dispense  Refill  . albuterol (PROVENTIL HFA;VENTOLIN HFA) 108 (90 BASE) MCG/ACT inhaler   Inhalation   Inhale 2 puffs into the lungs every 6 (six) hours as needed. For wheeze or shortness of breath   1 Inhaler   0   . albuterol  (PROVENTIL) (2.5 MG/3ML) 0.083% nebulizer solution   Nebulization   Take 2.5 mg by nebulization every 6 (six) hours as needed for wheezing or shortness of breath.         . ALPRAZolam (XANAX) 1 MG tablet   Oral   Take 1 mg by mouth 2 (two) times daily as needed for sleep or anxiety.         . budesonide-formoterol (SYMBICORT) 160-4.5 MCG/ACT inhaler   Inhalation   Inhale 2 puffs into the lungs 2 (two) times daily.   1 Inhaler   2   . cetirizine (ZYRTEC) 10 MG tablet   Oral   Take 10 mg by mouth daily.         . cyclobenzaprine (FLEXERIL) 5 MG tablet   Oral   Take 5 mg by mouth every 8 (eight) hours as needed for muscle spasms.         Marland Kitchen esomeprazole (NEXIUM) 40 MG capsule   Oral   Take 40 mg by mouth 2 (two) times daily.         Marland Kitchen glimepiride (AMARYL) 1 MG tablet   Oral   Take 1 mg by mouth daily before breakfast.         . insulin glargine (LANTUS) 100 UNIT/ML injection   Subcutaneous   Inject 16 Units into the skin at bedtime.          . mometasone (NASONEX) 50 MCG/ACT nasal spray   Nasal   Place 2 sprays into the nose daily.         . Oxycodone HCl 10 MG TABS   Oral   Take 5-10 mg by mouth 3 (three) times daily as needed (for pain).         . potassium chloride SA (K-DUR,KLOR-CON) 20 MEQ tablet   Oral   Take 20 mEq by mouth 2 (two) times daily.          . sertraline (ZOLOFT) 25 MG tablet   Oral   Take 25 mg by mouth daily.         Marland Kitchen spironolactone (ALDACTONE) 25 MG tablet   Oral   Take 25 mg by mouth 2 (two) times daily.         Marland Kitchen tiotropium (SPIRIVA) 18 MCG inhalation capsule   Inhalation   Place 18 mcg into inhaler and inhale daily.         Marland Kitchen torsemide (DEMADEX) 100 MG tablet   Oral   Take 1 tablet (100 mg total) by mouth 2 (two) times daily.   60 tablet   5     BP 131/70  Pulse 89  Temp(Src) 98.2 F (36.8 C) (Oral)  Resp 17  SpO2 98%  Physical Exam  Nursing note and vitals reviewed. Constitutional: She is  oriented to person, place, and time. She appears well-developed and well-nourished.  Patient is obese  HENT:  Head: Normocephalic and atraumatic.  Eyes: EOM are normal. Pupils are equal, round, and reactive to light.  Neck: Normal range of motion. Neck supple.  Cardiovascular: Normal rate, regular rhythm and normal heart sounds.   No murmur heard. Pulmonary/Chest: Effort normal. No respiratory distress. She has wheezes. She has no rales.  Diffuse wheezes and prolonged expirations.  Abdominal: Soft. Bowel sounds are normal. She exhibits no distension. There is no tenderness. There is no rebound and no guarding.  Musculoskeletal: Normal range of motion. She exhibits edema.  Neurological: She is alert and oriented to person, place, and time. No cranial nerve deficit.  Skin: Skin is warm and dry.  Psychiatric: She has a normal mood and affect. Her speech is normal.    ED Course  Procedures (including critical care time)  Labs Reviewed  CBC WITH DIFFERENTIAL - Abnormal; Notable for the following:    RBC 5.32 (*)    All other components within normal limits  POCT I-STAT, CHEM 8 - Abnormal; Notable for the following:    Potassium 3.2 (*)    Glucose, Bld 166 (*)    Hemoglobin 15.6 (*)    All other components within normal limits  PRO B NATRIURETIC PEPTIDE  URINALYSIS, ROUTINE W REFLEX MICROSCOPIC  POCT I-STAT TROPONIN I   Dg Chest 2 View  01/05/2013  *RADIOLOGY REPORT*  Clinical Data: Syncope.  Dyspnea.  COPD.  Asthma.  Sarcoidosis.  CHEST - 2 VIEW  Comparison: 10/21/2012  Findings: Mild cardiomegaly is stable.  Chronic interstitial lung disease and bibasilar scarring also stable in appearance.  No evidence of acute or superimposed infiltrate.  No evidence of pleural effusion.  IMPRESSION: Stable interstitial lung disease and bibasilar scarring. No acute findings.   Original Report Authenticated By: Myles Rosenthal, M.D.      1. COPD (chronic obstructive pulmonary disease)   2. Syncope    3. Hypokalemia     Date: 01/05/2013  Rate: 91  Rhythm: normal sinus rhythm  QRS Axis: normal  Intervals: normal  ST/T Wave abnormalities: nonspecific ST/T changes  Conduction Disutrbances:none  Narrative Interpretation:   Old EKG Reviewed: unchanged     MDM  Patient present with shortness of breath and COPD. History of COPD. Has had albuterol and steroids without much relief. X-ray shows stable findings. EKG is reassuring. Patient did have a syncopal episode. She has multiple medical problems will need admission for further evaluation.        Juliet Rude. Rubin Payor, MD 01/05/13 [phone]

## 2013-01-06 ENCOUNTER — Other Ambulatory Visit: Payer: Self-pay | Admitting: Internal Medicine

## 2013-01-06 ENCOUNTER — Telehealth: Payer: Self-pay | Admitting: Pulmonary Disease

## 2013-01-06 LAB — BASIC METABOLIC PANEL WITH GFR
BUN: 19 mg/dL (ref 6–23)
CO2: 29 meq/L (ref 19–32)
Calcium: 8.8 mg/dL (ref 8.4–10.5)
Chloride: 93 meq/L — ABNORMAL LOW (ref 96–112)
Creatinine, Ser: 0.83 mg/dL (ref 0.50–1.10)
GFR calc Af Amer: >90 mL/min (ref >90)
GFR calc non Af Amer: 84 mL/min — ABNORMAL LOW (ref >90)
Glucose, Bld: 277 mg/dL — ABNORMAL HIGH (ref 70–99)
Potassium: 4.3 meq/L (ref 3.5–5.1)
Sodium: 134 meq/L — ABNORMAL LOW (ref 135–145)

## 2013-01-06 LAB — CBC
MCV: 80.4 fL (ref 78.0–100.0)
Platelets: 200 10*3/uL (ref 150–400)
RBC: 5.52 MIL/uL — ABNORMAL HIGH (ref 3.87–5.11)
RDW: 15.1 % (ref 11.5–15.5)
WBC: 14.6 10*3/uL — ABNORMAL HIGH (ref 4.0–10.5)

## 2013-01-06 LAB — GLUCOSE, CAPILLARY
Glucose-Capillary: 287 mg/dL — ABNORMAL HIGH (ref 70–99)
Glucose-Capillary: 300 mg/dL — ABNORMAL HIGH (ref 70–99)
Glucose-Capillary: 319 mg/dL — ABNORMAL HIGH (ref 70–99)

## 2013-01-06 MED ORDER — DM-GUAIFENESIN ER 30-600 MG PO TB12
1.0000 | ORAL_TABLET | Freq: Two times a day (BID) | ORAL | Status: DC
  Administered 2013-01-06 – 2013-01-10 (×9): 1 via ORAL
  Filled 2013-01-06 (×10): qty 1

## 2013-01-06 NOTE — Telephone Encounter (Signed)
Faxed script bck to [facility],,,/lmb

## 2013-01-06 NOTE — Telephone Encounter (Signed)
Will forward to KC as FYI 

## 2013-01-06 NOTE — Progress Notes (Signed)
[facility]             Progress Note   Subjective: Still with significant wheezing.  Objective: Vital signs in last 24 hours: Temp:  [97.5 F (36.4 C)-98.1 F (36.7 C)] 97.7 F (36.5 C) (04/14 0617) Pulse Rate:  [87-92] 90 (04/14 0617) Resp:  [19-20] 20 (04/14 0617) BP: (119-146)/(69-83) 119/69 mmHg (04/14 0617) SpO2:  [93 %-98 %] 95 % (04/14 0617) Weight:  [134.537 kg (296 lb 9.6 oz)] 134.537 kg (296 lb 9.6 oz) (04/14 0617) Weight change:  Last BM Date: 01/04/13  Intake/Output from previous day: 04/13 0701 - 04/14 0700 In: 700 [P.O.:700] Out: 6025 [Urine:6025] Total I/O In: 240 [P.O.:240] Out: 1450 [Urine:1450]   Physical Exam: General: Morbidly obese, alert, awake, oriented x3, in no acute distress. HEENT: No bruits, no goiter. Heart: Regular rate and rhythm, without murmurs, rubs, gallops. Lungs: Bilateral expiratory wheezes. Abdomen: Soft, nontender, nondistended, positive bowel sounds. Extremities: 2-3+ pitting edema bilaterally. Neuro: Grossly intact, nonfocal.    Lab Results: Basic Metabolic Panel:  Recent Labs  16/10/96 0524 01/05/13 1038 01/06/13 0450  NA 138  --  134*  K 3.2*  --  4.3  CL 99  --  93*  CO2  --   --  29  GLUCOSE 166*  --  277*  BUN 10  --  19  CREATININE 0.80 0.70 0.83  CALCIUM  --   --  8.8   CBC:  Recent Labs  01/05/13 0455  01/05/13 1038 01/06/13 0450  WBC 8.2  --  8.7 14.6*  NEUTROABS 5.7  --   --   --   HGB 13.9  < > 14.6 14.9  HCT 42.3  < > 43.6 44.4  MCV 79.5  --  80.6 80.4  PLT 187  --  184 200  < > = values in this interval not displayed. BNP:  Recent Labs  01/05/13 0455  PROBNP 15.0    Recent Labs  01/05/13 0802 01/05/13 1141 01/05/13 1611 01/05/13 2139 01/06/13 0634 01/06/13 1117  GLUCAP 188* 254* 273* 349* 287* 300*   Hemoglobin A1C:  Recent Labs  01/05/13 1038  HGBA1C 6.1*   Urinalysis:  Recent Labs  01/05/13 2203  COLORURINE YELLOW  LABSPEC 1.011  PHURINE 6.0   GLUCOSEU 500*  HGBUR NEGATIVE  BILIRUBINUR NEGATIVE  KETONESUR NEGATIVE  PROTEINUR NEGATIVE  UROBILINOGEN 0.2  NITRITE NEGATIVE  LEUKOCYTESUR NEGATIVE    Recent Results (from the past 240 hour(s))  MRSA PCR SCREENING     Status: None   Collection Time    01/05/13  2:42 PM      Result Value Range Status   MRSA by PCR NEGATIVE  NEGATIVE Final   Comment:            The GeneXpert MRSA Assay (FDA     approved for NASAL specimens     only), is one component of a     comprehensive MRSA colonization     surveillance program. It is not     intended to diagnose MRSA     infection nor to guide or     monitor treatment for     MRSA infections.    Studies/Results: Dg Chest 2 View  01/05/2013  *RADIOLOGY REPORT*  Clinical Data: Syncope.  Dyspnea.  COPD.  Asthma.  Sarcoidosis.  CHEST - 2 VIEW  Comparison: 10/21/2012  Findings: Mild cardiomegaly is stable.  Chronic interstitial lung disease and bibasilar scarring also stable in appearance.  No evidence  of acute or superimposed infiltrate.  No evidence of pleural effusion.  IMPRESSION: Stable interstitial lung disease and bibasilar scarring. No acute findings.   Original Report Authenticated By: Myles Rosenthal, M.D.     Medications: Scheduled Meds: . albuterol  2.5 mg Nebulization Q6H  . budesonide-formoterol  2 puff Inhalation BID  . dextromethorphan-guaiFENesin  1 tablet Oral BID  . enoxaparin (LOVENOX) injection  40 mg Subcutaneous Q24H  . glimepiride  1 mg Oral QAC breakfast  . insulin aspart  0-20 Units Subcutaneous TID WC  . insulin aspart  6 Units Subcutaneous TID WC  . insulin glargine  16 Units Subcutaneous QHS  . ipratropium  0.5 mg Nebulization Q6H  . loratadine  10 mg Oral Daily  . methylPREDNISolone (SOLU-MEDROL) injection  80 mg Intravenous Q8H  . nicotine  14 mg Transdermal Daily  . pantoprazole  40 mg Oral Daily  . potassium chloride SA  20 mEq Oral BID  . sertraline  25 mg Oral Daily  . sodium chloride  3 mL  Intravenous Q12H  . sodium chloride  3 mL Intravenous Q12H  . spironolactone  25 mg Oral BID  . torsemide  100 mg Oral BID   Continuous Infusions:  PRN Meds:.sodium chloride, acetaminophen, acetaminophen, albuterol, ALPRAZolam, cyclobenzaprine, guaiFENesin-dextromethorphan, ondansetron (ZOFRAN) IV, ondansetron, oxyCODONE, sodium chloride  Assessment/Plan:  Principal Problem:   Acute-on-chronic respiratory failure Active Problems:   Tobacco abuse   Morbid obesity   COPD with acute exacerbation   Acute on chronic respiratory failure -Secondary to COPD with acute exacerbation. -See below for details.  COPD with acute exacerbation -Still with audible wheezes. -Continue current steroid dose without titrating today. -Continue albuterol and Atrovent scheduled meds.  Tobacco abuse -Has been counseled on cessation. -Nicotine patch on in the hospital.  DVT prophylaxis -Lovenox.   Time spent coordinating care: 35 minutes   LOS: 1 day   [facility] Pager: [phone] 01/06/2013, 12:29 PM

## 2013-01-07 LAB — BASIC METABOLIC PANEL
Calcium: 8.4 mg/dL (ref 8.4–10.5)
Chloride: 94 mEq/L — ABNORMAL LOW (ref 96–112)
Creatinine, Ser: 0.88 mg/dL (ref 0.50–1.10)
GFR calc Af Amer: >90 mL/min (ref >90)
Sodium: 136 mEq/L (ref 135–145)

## 2013-01-07 LAB — CBC
HCT: 42.8 % (ref 36.0–46.0)
Platelets: 217 10*3/uL (ref 150–400)
RBC: 5.36 MIL/uL — ABNORMAL HIGH (ref 3.87–5.11)
RDW: 15.7 % — ABNORMAL HIGH (ref 11.5–15.5)
WBC: 13.8 10*3/uL — ABNORMAL HIGH (ref 4.0–10.5)

## 2013-01-07 LAB — GLUCOSE, CAPILLARY
Glucose-Capillary: 270 mg/dL — ABNORMAL HIGH (ref 70–99)
Glucose-Capillary: 266 mg/dL — ABNORMAL HIGH (ref 70–99)

## 2013-01-07 MED ORDER — INSULIN ASPART 100 UNIT/ML ~~LOC~~ SOLN: 6.0000 [IU] | Freq: Three times a day (TID) | SUBCUTANEOUS | Status: DC

## 2013-01-07 MED ORDER — MENTHOL 3 MG MT LOZG
1.0000 | LOZENGE | OROMUCOSAL | Status: DC | PRN
  Administered 2013-01-07: 3 mg via ORAL
  Filled 2013-01-07: qty 9

## 2013-01-07 MED ORDER — INSULIN ASPART 100 UNIT/ML ~~LOC~~ SOLN: 0.0000 [IU] | Freq: Three times a day (TID) | SUBCUTANEOUS | Status: DC

## 2013-01-07 MED ORDER — HYDROCOD POLST-CHLORPHEN POLST 10-8 MG/5ML PO LQCR
5.0000 mL | Freq: Two times a day (BID) | ORAL | Status: DC | PRN
  Administered 2013-01-07 – 2013-01-10 (×6): 5 mL via ORAL
  Filled 2013-01-07 (×8): qty 5

## 2013-01-07 NOTE — Plan of Care (Signed)
Problem: Phase II Progression Outcomes Goal: Progress activity as tolerated unless otherwise ordered Outcome: Completed/Met Date Met:  01/07/13 Pt oob ad lib Goal: Discharge plan established Outcome: Completed/Met Date Met:  01/07/13 Plan is for pt to be dc to home  Problem: Phase III Progression Outcomes Goal: Activity at appropriate level-compared to baseline (UP IN CHAIR FOR HEMODIALYSIS)  Outcome: Completed/Met Date Met:  01/07/13 Pt oob ad lib, which is at baseline, goal met

## 2013-01-07 NOTE — Progress Notes (Signed)
Utilization Review Completed.   Leila Schuff, RN, BSN Nurse Case Manager  [phone]  

## 2013-01-07 NOTE — Progress Notes (Addendum)
[facility]             Progress Note   Subjective: Still with significant wheezing. Altho improved from yesterday.  Objective: Vital signs in last 24 hours: Temp:  [97 F (36.1 C)-98.4 F (36.9 C)] 97 F (36.1 C) (04/15 1408) Pulse Rate:  [86-99] 90 (04/15 1408) Resp:  [18-22] 22 (04/15 1408) BP: (100-145)/(50-82) 120/66 mmHg (04/15 1408) SpO2:  [93 %-98 %] 93 % (04/15 1408) Weight:  [134.2 kg (295 lb 13.7 oz)-135.2 kg (298 lb 1 oz)] 134.2 kg (295 lb 13.7 oz) (04/15 4696) Weight change: -0.4 kg (-14.1 oz) Last BM Date: 01/07/13  Intake/Output from previous day: 04/14 0701 - 04/15 0700 In: 1882 [P.O.:1882] Out: 5950 [Urine:5950] Total I/O In: 700 [P.O.:700] Out: 951 [Urine:950; Stool:1]   Physical Exam: General: Morbidly obese, alert, awake, oriented x3, in no acute distress. HEENT: No bruits, no goiter. Heart: Regular rate and rhythm, without murmurs, rubs, gallops. Lungs: Bilateral expiratory wheezes, improved from yesterday. Abdomen: Soft, nontender, nondistended, positive bowel sounds. Extremities: 2+ pitting edema bilaterally. Neuro: Grossly intact, nonfocal.    Lab Results: Basic Metabolic Panel:  Recent Labs  29/52/84 0450 01/07/13 0520  NA 134* 136  K 4.3 4.3  CL 93* 94*  CO2 29 32  GLUCOSE 277* 274*  BUN 19 25*  CREATININE 0.83 0.88  CALCIUM 8.8 8.4   CBC:  Recent Labs  01/05/13 0455  01/06/13 0450 01/07/13 0520  WBC 8.2  < > 14.6* 13.8*  NEUTROABS 5.7  --   --   --   HGB 13.9  < > 14.9 14.0  HCT 42.3  < > 44.4 42.8  MCV 79.5  < > 80.4 79.9  PLT 187  < > 200 217  < > = values in this interval not displayed. BNP:  Recent Labs  01/05/13 0455  PROBNP 15.0    Recent Labs  01/05/13 2139 01/06/13 0634 01/06/13 1117 01/06/13 1625 01/06/13 2142 01/07/13 0554  GLUCAP 349* 287* 300* 319* 258* 270*   Hemoglobin A1C:  Recent Labs  01/05/13 1038  HGBA1C 6.1*   Urinalysis:  Recent Labs  01/05/13 2203   COLORURINE YELLOW  LABSPEC 1.011  PHURINE 6.0  GLUCOSEU 500*  HGBUR NEGATIVE  BILIRUBINUR NEGATIVE  KETONESUR NEGATIVE  PROTEINUR NEGATIVE  UROBILINOGEN 0.2  NITRITE NEGATIVE  LEUKOCYTESUR NEGATIVE    Recent Results (from the past 240 hour(s))  MRSA PCR SCREENING     Status: None   Collection Time    01/05/13  2:42 PM      Result Value Range Status   MRSA by PCR NEGATIVE  NEGATIVE Final   Comment:            The GeneXpert MRSA Assay (FDA     approved for NASAL specimens     only), is one component of a     comprehensive MRSA colonization     surveillance program. It is not     intended to diagnose MRSA     infection nor to guide or     monitor treatment for     MRSA infections.    Studies/Results: No results found.  Medications: Scheduled Meds: . albuterol  2.5 mg Nebulization Q6H  . budesonide-formoterol  2 puff Inhalation BID  . dextromethorphan-guaiFENesin  1 tablet Oral BID  . enoxaparin (LOVENOX) injection  40 mg Subcutaneous Q24H  . glimepiride  1 mg Oral QAC breakfast  . insulin aspart  0-20 Units Subcutaneous TID WC  . insulin  aspart  0-20 Units Subcutaneous TID WC  . insulin aspart  6 Units Subcutaneous TID WC  . insulin aspart  6 Units Subcutaneous TID WC  . insulin glargine  16 Units Subcutaneous QHS  . ipratropium  0.5 mg Nebulization Q6H  . loratadine  10 mg Oral Daily  . methylPREDNISolone (SOLU-MEDROL) injection  80 mg Intravenous Q8H  . nicotine  14 mg Transdermal Daily  . pantoprazole  40 mg Oral Daily  . potassium chloride SA  20 mEq Oral BID  . sertraline  25 mg Oral Daily  . sodium chloride  3 mL Intravenous Q12H  . sodium chloride  3 mL Intravenous Q12H  . spironolactone  25 mg Oral BID  . torsemide  100 mg Oral BID   Continuous Infusions:  PRN Meds:.sodium chloride, acetaminophen, acetaminophen, albuterol, ALPRAZolam, cyclobenzaprine, guaiFENesin-dextromethorphan, menthol-cetylpyridinium, ondansetron (ZOFRAN) IV, ondansetron,  oxyCODONE, sodium chloride  Assessment/Plan:  Principal Problem:   Acute-on-chronic respiratory failure Active Problems:   Tobacco abuse   Morbid obesity   Diabetes type 2, uncontrolled   COPD with acute exacerbation   Acute on chronic respiratory failure -Secondary to COPD with acute exacerbation. -See below for details.  COPD with acute exacerbation -Still with audible wheezes. -Continue current steroid dose without titrating today. -Continue albuterol and Atrovent scheduled meds.  Tobacco abuse -Has been counseled on cessation. -Nicotine patch on in the hospital.  DM-II -CBGs elevated. -Start SSI. -Should improve once we start to titrate steroids.  DVT prophylaxis -Lovenox.   Time spent coordinating care: 35 minutes   LOS: 2 days   [facility] Pager: [phone] 01/07/2013, 4:11 PM

## 2013-01-07 NOTE — Progress Notes (Signed)
Inpatient Diabetes Program Recommendations  AACE/ADA: New Consensus Statement on Inpatient Glycemic Control (2013)  Target Ranges:  Prepandial:   less than 140 mg/dL      Peak postprandial:   less than 180 mg/dL (1-2 hours)      Critically ill patients:  140 - 180 mg/dL  Results for JOSLYNNE, KLATT (MRN [id number]) as of 01/07/2013 11:11  Ref. Range 01/06/2013 06:34 01/06/2013 11:17 01/06/2013 16:25 01/06/2013 21:42 01/07/2013 05:54  Glucose-Capillary Latest Range: 70-99 mg/dL 409 (H) 811 (H) 914 (H) 258 (H) 270 (H)   Inpatient Diabetes Program Recommendations Insulin - Basal: Increase Lantus to 25 units during steroid therapy Thank you  Piedad Climes BSN, RN,CDE Inpatient Diabetes Coordinator [phone] (team pager)

## 2013-01-07 NOTE — Progress Notes (Signed)
Patient evaluated for community based chronic disease management services with [facility] Care Management Program as a benefit of patient's [facility] Medicare Insurance. Patient will receive a post discharge transition of care call and will be evaluated for monthly home visits for assessments and disease process education. Spoke with patient at bedside to explain [facility] Care Management services.  Written consents for services obtained.  Patient would like to focus on smoking cessation, pain management, and obtaining a personal care aide.  Patient understands that ongoing contact and participation is required to continue receiving [facility] services.  Left contact information and [facility] literature at bedside. Made inpatient Ivonne Andrew RN Case Manager aware that [facility] Care Management following. Of note, [facility] Care Management services does not replace or interfere with any services that are arranged by inpatient case management or social work.  For additional questions or referrals please contact Anibal Henderson BSN RN [facility] Liaison at [phone].

## 2013-01-07 NOTE — Progress Notes (Signed)
Per pt, she wore cpap last night but it made her feel worse.  RT offered to adjust settings for pt comfort, but pt refused.  Pt stated she doesn't want to wear it anymore here in the hospital.  Pt was advised that RT available all night should she change her mind.

## 2013-01-07 NOTE — Progress Notes (Signed)
Placed pt. On CPAP of 12cm H2O per pt.'s home settings via nasal mask with 2L O2 bled in. Pt. Is tolerating CPAP well at this time.

## 2013-01-08 LAB — CBC
HCT: 43.8 % (ref 36.0–46.0)
Hemoglobin: 14.5 g/dL (ref 12.0–15.0)
MCV: 79.1 fL (ref 78.0–100.0)
RBC: 5.54 MIL/uL — ABNORMAL HIGH (ref 3.87–5.11)
RDW: 15.5 % (ref 11.5–15.5)
WBC: 13.7 10*3/uL — ABNORMAL HIGH (ref 4.0–10.5)

## 2013-01-08 LAB — GLUCOSE, CAPILLARY
Glucose-Capillary: 236 mg/dL — ABNORMAL HIGH (ref 70–99)
Glucose-Capillary: 255 mg/dL — ABNORMAL HIGH (ref 70–99)
Glucose-Capillary: 257 mg/dL — ABNORMAL HIGH (ref 70–99)

## 2013-01-08 LAB — BASIC METABOLIC PANEL
CO2: 33 mEq/L — ABNORMAL HIGH (ref 19–32)
Chloride: 93 mEq/L — ABNORMAL LOW (ref 96–112)
GFR calc Af Amer: 87 mL/min — ABNORMAL LOW (ref >90)
Potassium: 4.1 mEq/L (ref 3.5–5.1)

## 2013-01-08 LAB — EXPECTORATED SPUTUM ASSESSMENT W GRAM STAIN, RFLX TO RESP C

## 2013-01-08 LAB — HEMOGLOBIN A1C: Mean Plasma Glucose: 134 mg/dL — ABNORMAL HIGH (ref <117)

## 2013-01-08 MED ORDER — PANTOPRAZOLE SODIUM 40 MG PO TBEC
40.0000 mg | DELAYED_RELEASE_TABLET | Freq: Two times a day (BID) | ORAL | Status: DC
  Administered 2013-01-08 – 2013-01-10 (×4): 40 mg via ORAL
  Filled 2013-01-08 (×4): qty 1

## 2013-01-08 MED ORDER — AMOXICILLIN-POT CLAVULANATE 875-125 MG PO TABS
1.0000 | ORAL_TABLET | Freq: Two times a day (BID) | ORAL | Status: DC
  Administered 2013-01-08 – 2013-01-10 (×5): 1 via ORAL
  Filled 2013-01-08 (×6): qty 1

## 2013-01-08 NOTE — Plan of Care (Signed)
Problem: Discharge Progression Outcomes Goal: Tolerating diet Outcome: Completed/Met Date Met:  01/08/13 Pt tolerating diet well, no c/o n/v Goal: Activity appropriate for discharge plan Outcome: Completed/Met Date Met:  01/08/13 Pt oob ad lib

## 2013-01-08 NOTE — Progress Notes (Addendum)
[facility] PROGRESS NOTE  [name]:[id number] DOB: May 22, 1966 DOA: 01/05/2013 PCP: Rene Paci, MD  Assessment/Plan: 1-Acute on Chronic Respiratory failure: Secondary to COPD. Chest x ray on admission no acute finding.  Continue with treatment for COPD exacerbation. 2-COPD exacerbation. In a patient who is current smoker. Counseling provide. No improvement. Continue with Nebulizer albuterol, ipratropium. Continue with IV solumedrol. I will add Augmentin.  3-DVT Prophylaxis: Lovenox.  4-Diabetes: SSI. Hb A-1 c at 6.3. 5-Diastolic HF, chronic; Continue with spironolactone and torsemide.   Code Status: Full Code.  Family Communication: Care discussed with patient.  Disposition Plan: Home when stable   Consultants:  none  Procedures:  none  Antibiotics:  Augmentin 4-16  HPI/Subjective: She was feeling better yesterday. She relates wheezes, unable to cough sputum.   Objective: Filed Vitals:   01/08/13 0217 01/08/13 0531 01/08/13 0741 01/08/13 0825  BP:  144/96 121/75   Pulse:  84 89   Temp:  97.4 F (36.3 C) 97.4 F (36.3 C)   TempSrc:  Oral Oral   Resp:  20 20   Height:      Weight:  133.131 kg (293 lb 8 oz)    SpO2: 96% 100% 93% 97%    Intake/Output Summary (Last 24 hours) at 01/08/13 0937 Last data filed at 01/08/13 0538  Gross per 24 hour  Intake   1420 ml  Output   2501 ml  Net  -1081 ml   Filed Weights   01/07/13 0559 01/07/13 0632 01/08/13 0531  Weight: 135.2 kg (298 lb 1 oz) 134.2 kg (295 lb 13.7 oz) 133.131 kg (293 lb 8 oz)    Exam:   General:  No distress.  Cardiovascular: S 1, S 2 RRR  Respiratory: Bilateral expiratory wheezes.   Abdomen: Bs present, soft, NT  Musculoskeletal: no edema.   Data Reviewed: Basic Metabolic Panel:  Recent Labs Lab 01/05/13 0524 01/05/13 1038 01/06/13 0450 01/07/13 0520 01/08/13 0535  NA 138  --  134* 136 136  K 3.2*  --  4.3 4.3 4.1  CL 99  --  93* 94* 93*  CO2  --   --  29  32 33*  GLUCOSE 166*  --  277* 274* 209*  BUN 10  --  19 25* 28*  CREATININE 0.80 0.70 0.83 0.88 0.91  CALCIUM  --   --  8.8 8.4 7.9*   Liver Function Tests: No results found for this basename: AST, ALT, ALKPHOS, BILITOT, PROT, ALBUMIN,  in the last 168 hours No results found for this basename: LIPASE, AMYLASE,  in the last 168 hours No results found for this basename: AMMONIA,  in the last 168 hours CBC:  Recent Labs Lab 01/05/13 0455 01/05/13 0524 01/05/13 1038 01/06/13 0450 01/07/13 0520 01/08/13 0535  WBC 8.2  --  8.7 14.6* 13.8* 13.7*  NEUTROABS 5.7  --   --   --   --   --   HGB 13.9 15.6* 14.6 14.9 14.0 14.5  HCT 42.3 46.0 43.6 44.4 42.8 43.8  MCV 79.5  --  80.6 80.4 79.9 79.1  PLT 187  --  184 200 217 214   Cardiac Enzymes: No results found for this basename: CKTOTAL, CKMB, CKMBINDEX, TROPONINI,  in the last 168 hours BNP (last 3 results)  Recent Labs  09/17/12 1710 09/24/12 1559 01/05/13 0455  PROBNP 37.6 <5.0 15.0   CBG:  Recent Labs Lab 01/07/13 0554 01/07/13 1040 01/07/13 1626 01/07/13 2134 01/08/13 0607  GLUCAP 270* 376*  266* 318* 236*    Recent Results (from the past 240 hour(s))  MRSA PCR SCREENING     Status: None   Collection Time    01/05/13  2:42 PM      Result Value Range Status   MRSA by PCR NEGATIVE  NEGATIVE Final   Comment:            The GeneXpert MRSA Assay (FDA     approved for NASAL specimens     only), is one component of a     comprehensive MRSA colonization     surveillance program. It is not     intended to diagnose MRSA     infection nor to guide or     monitor treatment for     MRSA infections.     Studies: No results found.  Scheduled Meds: . albuterol  2.5 mg Nebulization Q6H  . amoxicillin-clavulanate  1 tablet Oral Q12H  . budesonide-formoterol  2 puff Inhalation BID  . dextromethorphan-guaiFENesin  1 tablet Oral BID  . enoxaparin (LOVENOX) injection  40 mg Subcutaneous Q24H  . glimepiride  1 mg Oral QAC  breakfast  . insulin aspart  0-20 Units Subcutaneous TID WC  . insulin aspart  6 Units Subcutaneous TID WC  . insulin glargine  16 Units Subcutaneous QHS  . ipratropium  0.5 mg Nebulization Q6H  . loratadine  10 mg Oral Daily  . methylPREDNISolone (SOLU-MEDROL) injection  80 mg Intravenous Q8H  . nicotine  14 mg Transdermal Daily  . pantoprazole  40 mg Oral BID  . potassium chloride SA  20 mEq Oral BID  . sertraline  25 mg Oral Daily  . sodium chloride  3 mL Intravenous Q12H  . sodium chloride  3 mL Intravenous Q12H  . spironolactone  25 mg Oral BID  . torsemide  100 mg Oral BID   Continuous Infusions:   Principal Problem:   Acute-on-chronic respiratory failure Active Problems:   Morbid obesity   Diabetes type 2, uncontrolled   Tobacco abuse   COPD with acute exacerbation    Time spent: 35 minutes.     Sareen Randon  [facility] Pager [phone]. If 7PM-7AM, please contact night-coverage at [email or web address], password [facility] 01/08/2013, 9:37 AM  LOS: 3 days

## 2013-01-08 NOTE — Progress Notes (Signed)
Inpatient Diabetes Program Recommendations  AACE/ADA: New Consensus Statement on Inpatient Glycemic Control (2013)  Target Ranges:  Prepandial:   less than 140 mg/dL      Peak postprandial:   less than 180 mg/dL (1-2 hours)      Critically ill patients:  140 - 180 mg/dL   Inpatient Diabetes Program Recommendations Insulin - Basal: Consider increasing Lantus to 25 units during steroid therapy Thank you  Piedad Climes BSN, RN,CDE Inpatient Diabetes Coordinator [phone] (team pager)

## 2013-01-09 ENCOUNTER — Telehealth: Payer: Self-pay | Admitting: *Deleted

## 2013-01-09 DIAGNOSIS — I5032 Chronic diastolic (congestive) heart failure: Secondary | ICD-10-CM

## 2013-01-09 LAB — BASIC METABOLIC PANEL
CO2: 34 mEq/L — ABNORMAL HIGH (ref 19–32)
Calcium: 7.8 mg/dL — ABNORMAL LOW (ref 8.4–10.5)
Creatinine, Ser: 0.84 mg/dL (ref 0.50–1.10)
GFR calc non Af Amer: 83 mL/min — ABNORMAL LOW (ref >90)
Sodium: 134 mEq/L — ABNORMAL LOW (ref 135–145)

## 2013-01-09 LAB — GLUCOSE, CAPILLARY
Glucose-Capillary: 300 mg/dL — ABNORMAL HIGH (ref 70–99)
Glucose-Capillary: 218 mg/dL — ABNORMAL HIGH (ref 70–99)
Glucose-Capillary: 208 mg/dL — ABNORMAL HIGH (ref 70–99)
Glucose-Capillary: 209 mg/dL — ABNORMAL HIGH (ref 70–99)

## 2013-01-09 MED ORDER — PREDNISONE 50 MG PO TABS
60.0000 mg | ORAL_TABLET | Freq: Every day | ORAL | Status: DC
  Administered 2013-01-09 – 2013-01-10 (×2): 60 mg via ORAL
  Filled 2013-01-09 (×3): qty 1

## 2013-01-09 MED ORDER — FLUTICASONE PROPIONATE 50 MCG/ACT NA SUSP
1.0000 | Freq: Every day | NASAL | Status: DC
  Administered 2013-01-09 – 2013-01-10 (×2): 1 via NASAL
  Filled 2013-01-09: qty 16

## 2013-01-09 MED ORDER — TIZANIDINE HCL 4 MG PO CAPS: 4.0000 mg | ORAL_CAPSULE | Freq: Three times a day (TID) | ORAL | Status: DC | PRN

## 2013-01-09 MED ORDER — TORSEMIDE 100 MG PO TABS: 100.0000 mg | ORAL_TABLET | Freq: Every day | ORAL | Status: DC

## 2013-01-09 NOTE — Telephone Encounter (Signed)
Received fax from insurance compant stating cyclobenzaprine is not covered by pt insurance plan. Recommending md to switch to alternative tizanidine, celebrex, meloxicam, diclofenac, or diflunisal.../lmb

## 2013-01-09 NOTE — Progress Notes (Signed)
Pt requesting flonase for stuffy nose.  Dr Sunnie Nielsen notified.  Order placed by MD.  Pt made aware.  Amanda Pea, Charity fundraiser.

## 2013-01-09 NOTE — Telephone Encounter (Signed)
Ok change flexeril to tizanidine - erx done

## 2013-01-09 NOTE — Telephone Encounter (Signed)
Notified pt of med change../lmb 

## 2013-01-09 NOTE — Progress Notes (Signed)
[facility] PROGRESS NOTE  PURVI RUEHL ZOX:[id number] DOB: 08-18-1966 DOA: 01/05/2013 PCP: Rene Paci, MD  Assessment/Plan: 1-Acute on Chronic Respiratory failure: Secondary to COPD. Chest x ray on admission no acute finding.  Continue with treatment for COPD exacerbation. 2-COPD exacerbation. In a patient who is current smoker. Counseling provided. Feels some improvement today.. Continue with Nebulizer albuterol, ipratropium. Continue with Augmentin day 2. Change IV solumedrol to prednisone. Marland Kitchen  3-DVT Prophylaxis: Lovenox.  4-Diabetes: SSI. Hb A-1 c at 6.3. hopefuly glucose will start decreasing with steroids taper.  5-Diastolic HF, chronic; Continue with spironolactone and torsemide. I will decrease torsemide dose, patient was only taking it once a day.   Code Status: Full Code.  Family Communication: Care discussed with patient.  Disposition Plan: Home 4-18   Consultants:  none  Procedures:  none  Antibiotics:  Augmentin 4-16  HPI/Subjective: Feeling better today, secretions are getting  Loose. She had some stomach cramping yesterday, which has resolved.  Objective: Filed Vitals:   01/08/13 2036 01/08/13 2126 01/09/13 0422 01/09/13 0848  BP:  153/83 147/97   Pulse:  86 77   Temp:  97.7 F (36.5 C) 97.3 F (36.3 C)   TempSrc:  Oral Oral   Resp:  18 18   Height:      Weight:   132.995 kg (293 lb 3.2 oz)   SpO2: 96% 98% 98% 98%    Intake/Output Summary (Last 24 hours) at 01/09/13 0935 Last data filed at 01/09/13 0914  Gross per 24 hour  Intake   2503 ml  Output   5650 ml  Net  -3147 ml   Filed Weights   01/07/13 0632 01/08/13 0531 01/09/13 0422  Weight: 134.2 kg (295 lb 13.7 oz) 133.131 kg (293 lb 8 oz) 132.995 kg (293 lb 3.2 oz)    Exam:   General:  No distress.  Cardiovascular: S 1, S 2 RRR  Respiratory: Bilateral expiratory wheezes.   Abdomen: Bs present, soft, NT  Musculoskeletal: no edema.   Data Reviewed: Basic Metabolic  Panel:  Recent Labs Lab 01/05/13 0524 01/05/13 1038 01/06/13 0450 01/07/13 0520 01/08/13 0535 01/09/13 0450  NA 138  --  134* 136 136 134*  K 3.2*  --  4.3 4.3 4.1 4.0  CL 99  --  93* 94* 93* 90*  CO2  --   --  29 32 33* 34*  GLUCOSE 166*  --  277* 274* 209* 204*  BUN 10  --  19 25* 28* 29*  CREATININE 0.80 0.70 0.83 0.88 0.91 0.84  CALCIUM  --   --  8.8 8.4 7.9* 7.8*   Liver Function Tests: No results found for this basename: AST, ALT, ALKPHOS, BILITOT, PROT, ALBUMIN,  in the last 168 hours No results found for this basename: LIPASE, AMYLASE,  in the last 168 hours No results found for this basename: AMMONIA,  in the last 168 hours CBC:  Recent Labs Lab 01/05/13 0455 01/05/13 0524 01/05/13 1038 01/06/13 0450 01/07/13 0520 01/08/13 0535  WBC 8.2  --  8.7 14.6* 13.8* 13.7*  NEUTROABS 5.7  --   --   --   --   --   HGB 13.9 15.6* 14.6 14.9 14.0 14.5  HCT 42.3 46.0 43.6 44.4 42.8 43.8  MCV 79.5  --  80.6 80.4 79.9 79.1  PLT 187  --  184 200 217 214   Cardiac Enzymes: No results found for this basename: CKTOTAL, CKMB, CKMBINDEX, TROPONINI,  in the last 168 hours  BNP (last 3 results)  Recent Labs  09/17/12 1710 09/24/12 1559 01/05/13 0455  PROBNP 37.6 <5.0 15.0   CBG:  Recent Labs Lab 01/08/13 0607 01/08/13 1131 01/08/13 1635 01/08/13 2122 01/09/13 0540  GLUCAP 236* 255* 257* 202* 300*    Recent Results (from the past 240 hour(s))  MRSA PCR SCREENING     Status: None   Collection Time    01/05/13  2:42 PM      Result Value Range Status   MRSA by PCR NEGATIVE  NEGATIVE Final   Comment:            The GeneXpert MRSA Assay (FDA     approved for NASAL specimens     only), is one component of a     comprehensive MRSA colonization     surveillance program. It is not     intended to diagnose MRSA     infection nor to guide or     monitor treatment for     MRSA infections.  CULTURE, RESPIRATORY (NON-EXPECTORATED)     Status: None   Collection Time     01/08/13  4:35 PM      Result Value Range Status   Specimen Description SPUTUM   Final   Special Requests NONE   Final   Gram Stain     Final   Value: FEW WBC PRESENT, PREDOMINANTLY PMN     RARE SQUAMOUS EPITHELIAL CELLS PRESENT     RARE GRAM POSITIVE COCCI     IN PAIRS   Culture PENDING   Incomplete   Report Status PENDING   Incomplete  CULTURE, EXPECTORATED SPUTUM-ASSESSMENT     Status: None   Collection Time    01/08/13  4:36 PM      Result Value Range Status   Specimen Description SPUTUM   Final   Special Requests NONE   Final   Sputum evaluation     Final   Value: THIS SPECIMEN IS ACCEPTABLE. RESPIRATORY CULTURE REPORT TO FOLLOW.   Report Status 01/08/2013 FINAL   Final     Studies: No results found.  Scheduled Meds: . albuterol  2.5 mg Nebulization Q6H  . amoxicillin-clavulanate  1 tablet Oral Q12H  . budesonide-formoterol  2 puff Inhalation BID  . dextromethorphan-guaiFENesin  1 tablet Oral BID  . enoxaparin (LOVENOX) injection  40 mg Subcutaneous Q24H  . glimepiride  1 mg Oral QAC breakfast  . insulin aspart  0-20 Units Subcutaneous TID WC  . insulin aspart  6 Units Subcutaneous TID WC  . insulin glargine  16 Units Subcutaneous QHS  . ipratropium  0.5 mg Nebulization Q6H  . loratadine  10 mg Oral Daily  . nicotine  14 mg Transdermal Daily  . pantoprazole  40 mg Oral BID  . potassium chloride SA  20 mEq Oral BID  . predniSONE  60 mg Oral Q breakfast  . sertraline  25 mg Oral Daily  . sodium chloride  3 mL Intravenous Q12H  . spironolactone  25 mg Oral BID  . [START ON 01/10/2013] torsemide  100 mg Oral Daily   Continuous Infusions:   Principal Problem:   Acute-on-chronic respiratory failure Active Problems:   Morbid obesity   Diabetes type 2, uncontrolled   Tobacco abuse   COPD with acute exacerbation    Time spent: 35 minutes.     [name]  [facility] Pager [phone]. If 7PM-7AM, please contact night-coverage at [email or web address],  password [facility] 01/09/2013, 9:35 AM  LOS: 4 days

## 2013-01-10 LAB — GLUCOSE, CAPILLARY: Glucose-Capillary: 125 mg/dL — ABNORMAL HIGH (ref 70–99)

## 2013-01-10 MED ORDER — NICOTINE 14 MG/24HR TD PT24: 1.0000 | MEDICATED_PATCH | Freq: Every day | TRANSDERMAL | Status: DC

## 2013-01-10 MED ORDER — IPRATROPIUM BROMIDE 0.02 % IN SOLN: 0.5000 mg | Freq: Four times a day (QID) | RESPIRATORY_TRACT | Status: DC

## 2013-01-10 MED ORDER — TORSEMIDE 100 MG PO TABS: 100.0000 mg | ORAL_TABLET | Freq: Every day | ORAL | Status: DC

## 2013-01-10 MED ORDER — PREDNISONE 20 MG PO TABS: ORAL_TABLET | ORAL | Status: DC

## 2013-01-10 MED ORDER — DM-GUAIFENESIN ER 30-600 MG PO TB12: 1.0000 | ORAL_TABLET | Freq: Two times a day (BID) | ORAL | Status: DC

## 2013-01-10 MED ORDER — GUAIFENESIN-DM 100-10 MG/5ML PO SYRP: 5.0000 mL | ORAL_SOLUTION | Freq: Four times a day (QID) | ORAL | Status: DC | PRN

## 2013-01-10 MED ORDER — AMOXICILLIN-POT CLAVULANATE 875-125 MG PO TABS: 1.0000 | ORAL_TABLET | Freq: Two times a day (BID) | ORAL | Status: DC

## 2013-01-10 NOTE — Discharge Summary (Signed)
Physician Discharge Summary  SIBLE STRALEY ZOX:[id number] DOB: 06/28/1966 DOA: 01/05/2013  PCP: Rene Paci, MD  Admit date: 01/05/2013 Discharge date: 01/10/2013  Time spent: 35 minutes  Recommendations for Outpatient Follow-up:  1. Needs to follow up with PCP, need a B-met.  2. Adjustment of diabetic medications.    Discharge Diagnoses:    Acute-on-chronic respiratory failure   COPD with acute exacerbation   Morbid obesity   Diabetes type 2, uncontrolled   Tobacco abuse    Discharge Condition: Stable.   Diet recommendation: Heart healthy/ Diabetic diet.   Filed Weights   01/08/13 0531 01/09/13 0422 01/10/13 0556  Weight: 133.131 kg (293 lb 8 oz) 132.995 kg (293 lb 3.2 oz) 133.221 kg (293 lb 11.2 oz)    History of present illness:  Patient is a morbidly obese 47 year old black woman has a past medical history significant for COPD on chronic oxygen, continued tobacco abuse, obstructive sleep apnea, who presents with the above-mentioned complaints. His current episode started about 5 days ago. She has subsequently become more and more short of breath. Last night she had to wake up in the middle of the night to go to the bathroom; when she came back had extreme shortness of breath to the point where she thought she was almost going to pass out. There was no actual loss of consciousness. She called EMS who brought her into the hospital where she was found to have active wheezing on chest auscultation. A chest x-ray did not show any acute findings. She continued to wheeze despite treatments given in the emergency department, hence, we were called to admit her for further evaluation and management.   Hospital Course:  1-Acute on Chronic Respiratory failure: Secondary to COPD. Chest x ray on admission no acute finding.  Patient was treated for  COPD exacerbation.  2-Acute COPD exacerbation. In a patient who is current smoker. Counseling provided.  Continue with Nebulizer  albuterol, ipratropium. Continue with Augmentin day 3. Will provide 5 more days of antibiotics. Patient received IV solumedrol. Subsequently was transition  to prednisone. She is feeling at baseline today.  3-DVT Prophylaxis: Lovenox.  4-Diabetes: SSI. Hb A-1 c at 6.3. hopefuly glucose will start decreasing with steroids taper. Resume home dose lantus.  5-Diastolic HF, chronic; Continue with spironolactone and torsemide. I will decrease torsemide dose, patient was only taking it once a day.    Procedures:  None  Consultations:  none  Discharge Exam: Filed Vitals:   01/09/13 2100 01/09/13 2112 01/10/13 0220 01/10/13 0556  BP: 142/81   113/67  Pulse: 82   79  Temp: 97.8 F (36.6 C)   97.8 F (36.6 C)  TempSrc: Oral   Oral  Resp: 18   19  Height:      Weight:    133.221 kg (293 lb 11.2 oz)  SpO2: 98% 96% 97% 97%    General: no distress. Cardiovascular: S 1, S 2 RRR Respiratory: CTA  Discharge Instructions  Discharge Orders   Future Appointments Provider Department Dept Phone   01/14/2013 3:30 PM Barbaraann Share, MD [facility] [phone]   01/15/2013 3:30 PM Newt Lukes, MD [facility] [phone]   Future Orders Complete By Expires     Diet - low sodium heart healthy  As directed     Increase activity slowly  As directed         Medication List    TAKE these medications       albuterol  108 (90 BASE) MCG/ACT inhaler  Commonly known as:  PROVENTIL HFA;VENTOLIN HFA  Inhale 2 puffs into the lungs every 6 (six) hours as needed. For wheeze or shortness of breath     albuterol (2.5 MG/3ML) 0.083% nebulizer solution  Commonly known as:  PROVENTIL  Take 2.5 mg by nebulization every 6 (six) hours as needed for wheezing or shortness of breath.     ALPRAZolam 1 MG tablet  Commonly known as:  XANAX  TAKE 1 TABLET BY MOUTH TWICE DAILY AS NEEDED     amoxicillin-clavulanate 875-125 MG per tablet  Commonly known as:  AUGMENTIN   Take 1 tablet by mouth every 12 (twelve) hours.     budesonide-formoterol 160-4.5 MCG/ACT inhaler  Commonly known as:  SYMBICORT  Inhale 2 puffs into the lungs 2 (two) times daily.     cetirizine 10 MG tablet  Commonly known as:  ZYRTEC  Take 10 mg by mouth daily.     dextromethorphan-guaiFENesin 30-600 MG per 12 hr tablet  Commonly known as:  MUCINEX DM  Take 1 tablet by mouth 2 (two) times daily.     guaiFENesin-dextromethorphan 100-10 MG/5ML syrup  Commonly known as:  ROBITUSSIN DM  Take 5 mLs by mouth 4 (four) times daily as needed for cough.     esomeprazole 40 MG capsule  Commonly known as:  NEXIUM  Take 40 mg by mouth 2 (two) times daily.     glimepiride 1 MG tablet  Commonly known as:  AMARYL  Take 1 mg by mouth daily before breakfast.     insulin glargine 100 UNIT/ML injection  Commonly known as:  LANTUS  Inject 16 Units into the skin at bedtime.     ipratropium 0.02 % nebulizer solution  Commonly known as:  ATROVENT  Take 2.5 mLs (0.5 mg total) by nebulization every 6 (six) hours.     mometasone 50 MCG/ACT nasal spray  Commonly known as:  NASONEX  Place 2 sprays into the nose daily.     nicotine 14 mg/24hr patch  Commonly known as:  NICODERM CQ - dosed in mg/24 hours  Place 1 patch onto the skin daily.     Oxycodone HCl 10 MG Tabs  Take 5-10 mg by mouth 3 (three) times daily as needed (for pain).     potassium chloride SA 20 MEQ tablet  Commonly known as:  K-DUR,KLOR-CON  Take 20 mEq by mouth 2 (two) times daily.     predniSONE 20 MG tablet  Commonly known as:  DELTASONE  Take 3 tablets for 3 days then 2 tablet for 2 days then 1 tablet for one day then stop.     sertraline 25 MG tablet  Commonly known as:  ZOLOFT  Take 25 mg by mouth daily.     spironolactone 25 MG tablet  Commonly known as:  ALDACTONE  Take 25 mg by mouth 2 (two) times daily.     tiotropium 18 MCG inhalation capsule  Commonly known as:  SPIRIVA  Place 18 mcg into inhaler and  inhale daily.     tiZANidine 4 MG capsule  Commonly known as:  ZANAFLEX  Take 1 capsule (4 mg total) by mouth 3 (three) times daily as needed for muscle spasms.     torsemide 100 MG tablet  Commonly known as:  DEMADEX  Take 1 tablet (100 mg total) by mouth daily.          The results of significant diagnostics from this hospitalization (including imaging, microbiology, ancillary  and laboratory) are listed below for reference.    Significant Diagnostic Studies: Dg Chest 2 View  01/05/2013  *RADIOLOGY REPORT*  Clinical Data: Syncope.  Dyspnea.  COPD.  Asthma.  Sarcoidosis.  CHEST - 2 VIEW  Comparison: 10/21/2012  Findings: Mild cardiomegaly is stable.  Chronic interstitial lung disease and bibasilar scarring also stable in appearance.  No evidence of acute or superimposed infiltrate.  No evidence of pleural effusion.  IMPRESSION: Stable interstitial lung disease and bibasilar scarring. No acute findings.   Original Report Authenticated By: Myles Rosenthal, M.D.     Microbiology: Recent Results (from the past 240 hour(s))  MRSA PCR SCREENING     Status: None   Collection Time    01/05/13  2:42 PM      Result Value Range Status   MRSA by PCR NEGATIVE  NEGATIVE Final   Comment:            The GeneXpert MRSA Assay (FDA     approved for NASAL specimens     only), is one component of a     comprehensive MRSA colonization     surveillance program. It is not     intended to diagnose MRSA     infection nor to guide or     monitor treatment for     MRSA infections.  CULTURE, RESPIRATORY (NON-EXPECTORATED)     Status: None   Collection Time    01/08/13  4:35 PM      Result Value Range Status   Specimen Description SPUTUM   Final   Special Requests NONE   Final   Gram Stain     Final   Value: FEW WBC PRESENT, PREDOMINANTLY PMN     RARE SQUAMOUS EPITHELIAL CELLS PRESENT     RARE GRAM POSITIVE COCCI     IN PAIRS   Culture NORMAL OROPHARYNGEAL FLORA   Final   Report Status PENDING    Incomplete  CULTURE, EXPECTORATED SPUTUM-ASSESSMENT     Status: None   Collection Time    01/08/13  4:36 PM      Result Value Range Status   Specimen Description SPUTUM   Final   Special Requests NONE   Final   Sputum evaluation     Final   Value: THIS SPECIMEN IS ACCEPTABLE. RESPIRATORY CULTURE REPORT TO FOLLOW.   Report Status 01/08/2013 FINAL   Final     Labs: Basic Metabolic Panel:  Recent Labs Lab 01/05/13 0524 01/05/13 1038 01/06/13 0450 01/07/13 0520 01/08/13 0535 01/09/13 0450  NA 138  --  134* 136 136 134*  K 3.2*  --  4.3 4.3 4.1 4.0  CL 99  --  93* 94* 93* 90*  CO2  --   --  29 32 33* 34*  GLUCOSE 166*  --  277* 274* 209* 204*  BUN 10  --  19 25* 28* 29*  CREATININE 0.80 0.70 0.83 0.88 0.91 0.84  CALCIUM  --   --  8.8 8.4 7.9* 7.8*   Liver Function Tests: No results found for this basename: AST, ALT, ALKPHOS, BILITOT, PROT, ALBUMIN,  in the last 168 hours No results found for this basename: LIPASE, AMYLASE,  in the last 168 hours No results found for this basename: AMMONIA,  in the last 168 hours CBC:  Recent Labs Lab 01/05/13 0455 01/05/13 0524 01/05/13 1038 01/06/13 0450 01/07/13 0520 01/08/13 0535  WBC 8.2  --  8.7 14.6* 13.8* 13.7*  NEUTROABS 5.7  --   --   --   --   --  HGB 13.9 15.6* 14.6 14.9 14.0 14.5  HCT 42.3 46.0 43.6 44.4 42.8 43.8  MCV 79.5  --  80.6 80.4 79.9 79.1  PLT 187  --  184 200 217 214   Cardiac Enzymes: No results found for this basename: CKTOTAL, CKMB, CKMBINDEX, TROPONINI,  in the last 168 hours BNP: BNP (last 3 results)  Recent Labs  09/17/12 1710 09/24/12 1559 01/05/13 0455  PROBNP 37.6 <5.0 15.0   CBG:  Recent Labs Lab 01/09/13 0540 01/09/13 1128 01/09/13 1619 01/09/13 2059 01/10/13 0558  GLUCAP 300* 218* 208* 209* 125*       Signed:  Sharad Vaneaton  [facility] 01/10/2013, 10:01 AM

## 2013-01-10 NOTE — Progress Notes (Signed)
Pt d/c off floor via w/c. By NT Didier to awaiting transport to home.  Amanda Pea, Charity fundraiser.

## 2013-01-10 NOTE — Progress Notes (Signed)
All d/c instruction explained and given to pt.  Verbalized understanding. Pt waiting for ride home.  Will continue to monitor.  Pressley Barsky,RN.

## 2013-01-11 LAB — CULTURE, RESPIRATORY W GRAM STAIN

## 2013-01-13 ENCOUNTER — Telehealth: Payer: Self-pay | Admitting: General Practice

## 2013-01-13 ENCOUNTER — Telehealth: Payer: Self-pay | Admitting: Internal Medicine

## 2013-01-13 ENCOUNTER — Encounter: Payer: Self-pay | Admitting: General Practice

## 2013-01-13 NOTE — Telephone Encounter (Signed)
Noted, thanks!

## 2013-01-13 NOTE — Telephone Encounter (Signed)
Attempted Transitional care call.  LMOM for patient to call office for f/u hospital appointment.

## 2013-01-13 NOTE — Telephone Encounter (Signed)
[facility] Triage Call Report Triage Record Num: [id number] Operator: Caswell Corwin Patient Name: [name] Call Date & Time: 01/12/2013 3:06:46PM Patient Phone: [phone] PCP: Rene Paci Patient Gender: Female PCP Fax : [phone] Patient DOB: 11/16/1965 Practice Name: [facility] Reason for Call: Caller: [name]/Patient; PCP: Rene Paci (Adults only); CB#: [phone]; Call regarding COPD Resp Failure Diabetes, Not Feeling Well. She is having spasm in her abdomen and started at 1430. BG now is 179. Her husband and son just came in so she is not alone. Pt sounds disoriented. Triaged Diabetes: Respiratory Problems. states she gets SOB with exertion. She also states she is very thirsty. Needs to be seen in the ED for new or worsening breathing problems. I asked if I needed to speak with her husband asnd she states she would decide what to do and hung up. She uses [facility]. Protocol(s) Used: Diabetes: Respiratory Problems Recommended Outcome per Protocol: See ED Immediately Reason for Outcome: New or worsening breathing problems

## 2013-01-14 ENCOUNTER — Telehealth: Payer: Self-pay | Admitting: General Practice

## 2013-01-14 ENCOUNTER — Ambulatory Visit: Payer: Medicare Other | Admitting: Pulmonary Disease

## 2013-01-14 NOTE — Telephone Encounter (Signed)
Transitional care call:  Patient dc'd from hospital on 4/18 - DC diagnosis:  Acute-on-respiratory failure, COPD with acute exacerbation, Morbid obesity, Diabetes type 2, uncontrolled and Tobacco abuse.  Spoke with Patient.  Patient says that she is very weak but is breathing well.  Pt does not have questions about discharge instructions.  I reviewed medications with patient and pt does have all meds in the home.  Patient did state that the tizanidine is making her very sleep and makes her feel like a zombie.  Patient also states the the tizanidine is not helping with the stomach spasms.  I assured patient I would direct her concern to Dr. Felicity Coyer.  Patient did state that her blood sugar was 272 last evening but that she had been eating sweets all day.  I did inform patient that portion control is very important in controlling glucose levels and that eating a high protein snack before bed can help level out blood sugar spikes.  Patient lives with members of her family and has help in the home.  Oxygen in the home and patient is aware of safety precautions ie no smoking.  Patient verbalized understanding.  I assured patient that I would direct her concerns to Dr. Felicity Coyer.  F/U appointment with Dr. Felicity Coyer on 4/23  3:30.

## 2013-01-15 ENCOUNTER — Other Ambulatory Visit (INDEPENDENT_AMBULATORY_CARE_PROVIDER_SITE_OTHER): Payer: Medicare Other

## 2013-01-15 ENCOUNTER — Encounter: Payer: Self-pay | Admitting: Internal Medicine

## 2013-01-15 ENCOUNTER — Ambulatory Visit (INDEPENDENT_AMBULATORY_CARE_PROVIDER_SITE_OTHER): Payer: Medicare Other | Admitting: Internal Medicine

## 2013-01-15 VITALS — BP 130/72 | HR 81 | Temp 97.6°F | Wt 292.4 lb

## 2013-01-15 DIAGNOSIS — I10 Essential (primary) hypertension: Secondary | ICD-10-CM

## 2013-01-15 DIAGNOSIS — J9620 Acute and chronic respiratory failure, unspecified whether with hypoxia or hypercapnia: Secondary | ICD-10-CM

## 2013-01-15 DIAGNOSIS — I5032 Chronic diastolic (congestive) heart failure: Secondary | ICD-10-CM

## 2013-01-15 DIAGNOSIS — B373 Candidiasis of vulva and vagina: Secondary | ICD-10-CM

## 2013-01-15 LAB — BASIC METABOLIC PANEL
BUN: 24 mg/dL — ABNORMAL HIGH (ref 6–23)
CO2: 30 mEq/L (ref 19–32)
GFR: 69.51 mL/min (ref >60.00)
Glucose, Bld: 168 mg/dL — ABNORMAL HIGH (ref 70–99)
Potassium: 4.4 mEq/L (ref 3.5–5.1)
Sodium: 135 mEq/L (ref 135–145)

## 2013-01-15 MED ORDER — OXYCODONE HCL 10 MG PO TABS: 5.0000 mg | ORAL_TABLET | Freq: Three times a day (TID) | ORAL | Status: DC | PRN

## 2013-01-15 MED ORDER — FLUCONAZOLE 150 MG PO TABS: 150.0000 mg | ORAL_TABLET | Freq: Once | ORAL | Status: AC

## 2013-01-15 NOTE — Assessment & Plan Note (Signed)
Acute exacerbation with COPD flare April 2014 reviewed, hospitalization for same Now back to baseline On bipap at home - baseline hypercarbia reviewed Resumed chronic inhalers with when necessary nebulizer, continue home oxygen and followup with pulmonary specialist as planned

## 2013-01-15 NOTE — Patient Instructions (Signed)
It was good to see you today. We have reviewed your hospital records including labs and tests today Medications reviewed and updated, no changes at this time. Use diflucan for yeast symptoms as needed Test(s) ordered today. Your results will be released to MyChart (or called to you) after review, usually within 72hours after test completion. If any changes need to be made, you will be notified at that same time. Congratulations on your decision to giving up cigarettes! Continue to use nicotine gum, nicotine patches or electronic cigarettes. If you're interested in medication to help you quit, please call  - let me know how I can help! Please schedule followup in 3-4 months for diabetes mellitus check, call sooner if problems.

## 2013-01-15 NOTE — Assessment & Plan Note (Signed)
BP Readings from Last 3 Encounters:  01/15/13 130/72  01/10/13 113/67  12/06/12 110/78   The current medical regimen is effective;  continue present plan and medications.

## 2013-01-15 NOTE — Assessment & Plan Note (Signed)
prior exac 10/2011, 12/2011 and 12/2012- treated with temporary increase diuretic Check Bmet now Continue monitor sodium intake and monitor weights at home - call if increase weight >3#/day Encouragement provided on continued weight loss efforts

## 2013-01-15 NOTE — Progress Notes (Signed)
Subjective:    [name], female    DOB: 1966-02-28, 47 y.o.   MRN: [id number]  HPI  Here for hospital followup Admit date: 01/05/2013 Discharge date: 01/10/2013  Time spent: 35 minutes  Recommendations for Outpatient Follow-up:   1. Needs to follow up with PCP, need a B-met.   2. Adjustment of diabetic medications.   Discharge Diagnoses:     Acute-on-chronic respiratory failure   COPD with acute exacerbation   Morbid obesity   Diabetes type 2, uncontrolled   Tobacco abuse  Since discharge, has developed yeast infection related to completion of Augmentin antibiotic therapy Dyspnea back to baseline, no fever or continued sputum  Past Medical History  Diagnosis Date  . Sleep apnea     noncompliant w/ CPAP  . Hypertension   . Hyperlipidemia   . Chronic headache   . Fibromyalgia     daily narcotics  . Anxiety     hx chronic BZ use, stopped 07/2010  . Anemia   . Pulmonary sarcoidosis     unimpressive CT chest 2011  . Colonic polyp   . GERD (gastroesophageal reflux disease)   . ALLERGIC RHINITIS   . Asthma   . CHF (congestive heart failure)     Diastolic with fluid overload, May, 2012, LVEF 60%  . Morbid obesity   . Depression   . Panic attacks   . Diabetes mellitus   . COPD (chronic obstructive pulmonary disease)     on home O2, moderate airflow obstruction, suspect d/t emphysema  . Obesity   . Elevated LFTs 09/2011  . Ovarian cyst   . Chronic back pain     Past Surgical History  Procedure Laterality Date  . Polypectomy  2011  . Lumbar microdiscectomy  07/06/2011    R L4-5, stern  . Back surgery    . Carpal tunnel release    . Steroid spinal injections       Review of Systems  Constitutional: Negative for fever and fatigue.  Respiratory: Negative for cough, shortness of breath and wheezing.   Cardiovascular: Negative for chest pain and palpitations.       Objective:   Physical Exam  BP 130/72  Pulse 81  Temp(Src) 97.6 F  (36.4 C) (Oral)  Wt 292 lb 6.4 oz (132.632 kg)  BMI 43.81 kg/m2  SpO2 90% Gen: obese, NAD. Wearing nasal cannula oxygen continuously Lungs: CTA B, decreased air mvt at bases CV: distant but RRR, no BLE edema Abd: obese, soft, nontender, bowel sounds present Neuro: awake alert and oriented x4. No cranial nerve deficits. Moves all extremities well. Coordination, balance, gait are normal   Lab Results  Component Value Date   WBC 13.7* 01/08/2013   HGB 14.5 01/08/2013   HCT 43.8 01/08/2013   PLT 214 01/08/2013   GLUCOSE 204* 01/09/2013   CHOL 255* 02/13/2012   TRIG 133.0 02/13/2012   HDL 54.30 02/13/2012   LDLDIRECT 186.4 02/13/2012   LDLCALC  Value: 92        Total Cholesterol/HDL:CHD Risk Coronary Heart Disease Risk Table                     Men   Women  1/2 Average Risk   3.4   3.3  Average Risk       5.0   4.4  2 X Average Risk   9.6   7.1  3 X Average Risk  23.4   11.0  Use the calculated Patient Ratio above and the CHD Risk Table to determine the patient's CHD Risk.        ATP III CLASSIFICATION (LDL):  <100     mg/dL   Optimal  161-096  mg/dL   Near or Above                    Optimal  130-159  mg/dL   Borderline  045-409  mg/dL   High  >811     mg/dL   Very High 06/08/7828   ALT 40* 10/21/2012   AST 35 10/21/2012   NA 134* 01/09/2013   K 4.0 01/09/2013   CL 90* 01/09/2013   CREATININE 0.84 01/09/2013   BUN 29* 01/09/2013   CO2 34* 01/09/2013   TSH 0.701 04/22/2012   HGBA1C 6.3* 01/07/2013       Assessment & Plan:   Transitional care visit within 7 days - hosp 4/13-18 reviewed Phone note in chart reviewed  Vaginal candidiasis in diabetic patient following systemic antibiotic therapy. diflucan prescribed  see problem list. Medications and labs reviewed today.

## 2013-01-23 ENCOUNTER — Other Ambulatory Visit: Payer: Self-pay | Admitting: *Deleted

## 2013-01-23 ENCOUNTER — Other Ambulatory Visit: Payer: Self-pay | Admitting: Internal Medicine

## 2013-01-23 MED ORDER — CETIRIZINE HCL 1 MG/ML PO SYRP: ORAL_SOLUTION | ORAL | Status: DC

## 2013-01-23 MED ORDER — POTASSIUM CHLORIDE CRYS ER 20 MEQ PO TBCR: 40.0000 meq | EXTENDED_RELEASE_TABLET | Freq: Three times a day (TID) | ORAL | Status: DC

## 2013-01-23 MED ORDER — ESOMEPRAZOLE MAGNESIUM 40 MG PO CPDR: 40.0000 mg | DELAYED_RELEASE_CAPSULE | Freq: Two times a day (BID) | ORAL | Status: DC

## 2013-01-27 ENCOUNTER — Telehealth: Payer: Self-pay | Admitting: *Deleted

## 2013-01-27 DIAGNOSIS — M5431 Sciatica, right side: Secondary | ICD-10-CM

## 2013-01-27 NOTE — Telephone Encounter (Signed)
Pt states she is needing another referral for PT for murphy wainer. She miss the first one due to her being sick & they told her she will need a new referral.../lmb

## 2013-01-27 NOTE — Telephone Encounter (Signed)
Ok order done 

## 2013-01-27 NOTE — Telephone Encounter (Signed)
Pt already made aware will be contacted once referral has been set-up...lmb

## 2013-01-28 ENCOUNTER — Telehealth: Payer: Self-pay | Admitting: *Deleted

## 2013-01-28 DIAGNOSIS — E1149 Type 2 diabetes mellitus with other diabetic neurological complication: Secondary | ICD-10-CM

## 2013-01-28 DIAGNOSIS — M79673 Pain in unspecified foot: Secondary | ICD-10-CM

## 2013-01-28 NOTE — Telephone Encounter (Signed)
Pt is requesting referral to podiatrist for tenderness around toenails and feet and pt also is diabetic.

## 2013-01-29 NOTE — Telephone Encounter (Signed)
Referral ordered

## 2013-01-29 NOTE — Telephone Encounter (Signed)
Pt informed of referral

## 2013-02-03 ENCOUNTER — Other Ambulatory Visit: Payer: Self-pay | Admitting: *Deleted

## 2013-02-03 ENCOUNTER — Encounter: Payer: Self-pay | Admitting: Pulmonary Disease

## 2013-02-03 ENCOUNTER — Ambulatory Visit (INDEPENDENT_AMBULATORY_CARE_PROVIDER_SITE_OTHER): Payer: Medicare Other | Admitting: Pulmonary Disease

## 2013-02-03 VITALS — BP 110/80 | HR 101 | Temp 98.5°F | Ht 68.0 in | Wt 297.1 lb

## 2013-02-03 DIAGNOSIS — J449 Chronic obstructive pulmonary disease, unspecified: Secondary | ICD-10-CM

## 2013-02-03 DIAGNOSIS — J4489 Other specified chronic obstructive pulmonary disease: Secondary | ICD-10-CM

## 2013-02-03 DIAGNOSIS — J9610 Chronic respiratory failure, unspecified whether with hypoxia or hypercapnia: Secondary | ICD-10-CM

## 2013-02-03 DIAGNOSIS — G4733 Obstructive sleep apnea (adult) (pediatric): Secondary | ICD-10-CM

## 2013-02-03 DIAGNOSIS — E662 Morbid (severe) obesity with alveolar hypoventilation: Secondary | ICD-10-CM

## 2013-02-03 MED ORDER — ALPRAZOLAM 1 MG PO TABS: ORAL_TABLET | ORAL | Status: DC

## 2013-02-03 NOTE — Progress Notes (Signed)
  Subjective:    [name], female    DOB: [birth year], 47 y.o.   MRN: [id number]  HPI The patient comes in today for followup of her COPD and obstructive sleep apnea with obesity hypoventilation.  She was in the hospital last month for a COPD exacerbation, but had started back smoking again.  She has not smoked since her discharge from the hospital, and feels that she is doing much better.  She has minimal cough, and her dyspnea on exertion is returning to baseline.  She is staying on her bronchodilator regimen.  She has been wearing her bilevel compliantly, and her only complaint is that of nasal congestion at times.  I've asked her to adjust her heated humidifier for this.   Review of Systems  Constitutional: Negative for fever and unexpected weight change.  HENT: Positive for postnasal drip. Negative for ear pain, nosebleeds, congestion, sore throat, rhinorrhea, sneezing, trouble swallowing, dental problem and sinus pressure.   Eyes: Negative for redness and itching.  Respiratory: Positive for cough, shortness of breath and wheezing. Negative for chest tightness.   Cardiovascular: Positive for palpitations. Negative for leg swelling.  Gastrointestinal: Positive for nausea. Negative for vomiting.  Genitourinary: Negative for dysuria.  Musculoskeletal: Negative for joint swelling.  Skin: Negative for rash.  Neurological: Positive for headaches.  Hematological: Does not bruise/bleed easily.  Psychiatric/Behavioral: Positive for dysphoric mood. The patient is nervous/anxious.        Objective:   Physical Exam Morbidly obese female in no acute distress Nose without purulence or discharge noted Neck without lymphadenopathy or thyromegaly Chest with mildly decreased breath sounds, no active wheezing or crackles Cardiac exam with regular rate and rhythm Lower extremities with mild edema, no cyanosis Alert and oriented, moves all 4 extremities.       Assessment & Plan:

## 2013-02-03 NOTE — Patient Instructions (Addendum)
No change in medications except stop your atrovent (ipratroprium) nebulizer treatments. Will refer you to pulmonary rehab at [place]. Stay away from cigarettes.  You can do it!! followup with me in 3mos, but call if you are having issues.

## 2013-02-03 NOTE — Assessment & Plan Note (Signed)
The patient is getting over a recent hospitalization with COPD exacerbation, and feels that she is returning to baseline.  She is not smoking currently, and this is helping tremendously.  She is on a very aggressive bronchodilator regimen, and there is really nothing further to add.  I would like to refer her to pulmonary rehabilitation to see if this will help.  I have also asked her to stay compliant with her supplemental oxygen.

## 2013-02-03 NOTE — Telephone Encounter (Signed)
Left msg on vm up stairs seeing Dr. Shelle Iron wanting to pick up rx for her xanax...Raechel Chute

## 2013-02-04 NOTE — Telephone Encounter (Signed)
Called pt no answer LMOM rx fax to [facility].../lmb 

## 2013-02-05 ENCOUNTER — Telehealth (HOSPITAL_COMMUNITY): Payer: Self-pay | Admitting: *Deleted

## 2013-02-05 NOTE — Telephone Encounter (Signed)
Spoke with patient per phone, she has a large co pay for her insurance and medicaid which does not cover pulmonary  rehab.  We have referred her to financial assist and will admit as son as she is approved.  Cathie Olden RN

## 2013-02-08 DIAGNOSIS — B373 Candidiasis of vulva and vagina: Secondary | ICD-10-CM

## 2013-02-08 DIAGNOSIS — J9620 Acute and chronic respiratory failure, unspecified whether with hypoxia or hypercapnia: Secondary | ICD-10-CM

## 2013-02-08 DIAGNOSIS — I5032 Chronic diastolic (congestive) heart failure: Secondary | ICD-10-CM

## 2013-02-08 DIAGNOSIS — I10 Essential (primary) hypertension: Secondary | ICD-10-CM

## 2013-02-11 ENCOUNTER — Telehealth: Payer: Self-pay | Admitting: Internal Medicine

## 2013-02-11 ENCOUNTER — Ambulatory Visit: Payer: Self-pay | Admitting: Internal Medicine

## 2013-02-11 DIAGNOSIS — Z0289 Encounter for other administrative examinations: Secondary | ICD-10-CM

## 2013-02-11 NOTE — Telephone Encounter (Signed)
Patient Information:  Caller Name: Livi  Phone: [phone]  Patient: [name], [name]  Gender: Female  DOB: [birth year]  Age: 47 Years  PCP: Rene Paci (Adults only)  Pregnant: No  Office Follow Up:  Does the office need to follow up with this patient?: Yes  Instructions For The Office: No appts available for See Today in Office krs/can  RN Note:  States has ongoing sciatica right leg.  States she went to PT 02/08/13 but states pain flared 02/10/13.  States her pain has been unrelieved by usual treatment/medications.  States she did not have transportation for her appt AM 02/11/13 with Dr. Felicity Coyer.  Per leg pain protocol, disposition See in Office Today; no appts available in system.  Info to office for staff/provider review/appt management/callback.  May reach patient at [phone].  krs/can  Symptoms  Reason For Call & Symptoms: severe, unrelenting right leg pain.  Reviewed Health History In EMR: Yes  Reviewed Medications In EMR: Yes  Reviewed Allergies In EMR: Yes  Reviewed Surgeries / Procedures: Yes  Date of Onset of Symptoms: Unknown OB / GYN:  LMP: Unknown  Guideline(s) Used:  Leg Pain  Disposition Per Guideline:   Go to Office Now  Reason For Disposition Reached:   Severe pain (e.g., excruciating, unable to do any normal activities)  Advice Given:  N/A  Patient Will Follow Care Advice:  YES

## 2013-02-12 ENCOUNTER — Other Ambulatory Visit: Payer: Self-pay | Admitting: Internal Medicine

## 2013-02-12 ENCOUNTER — Telehealth: Payer: Self-pay

## 2013-02-12 ENCOUNTER — Encounter: Payer: Self-pay | Admitting: Internal Medicine

## 2013-02-12 DIAGNOSIS — M545 Low back pain: Secondary | ICD-10-CM

## 2013-02-12 DIAGNOSIS — M5431 Sciatica, right side: Secondary | ICD-10-CM

## 2013-02-12 MED ORDER — DIAZEPAM 5 MG PO TABS: 5.0000 mg | ORAL_TABLET | Freq: Three times a day (TID) | ORAL | Status: AC | PRN

## 2013-02-12 MED ORDER — PREDNISONE (PAK) 10 MG PO TABS: 10.0000 mg | ORAL_TABLET | ORAL | Status: DC

## 2013-02-12 MED ORDER — OXYCODONE HCL 10 MG PO TABS: 5.0000 mg | ORAL_TABLET | Freq: Three times a day (TID) | ORAL | Status: DC | PRN

## 2013-02-12 NOTE — Telephone Encounter (Signed)
Rx faxed to pharmacy, pt advised of same and given MD's recommendations via personal VM

## 2013-02-12 NOTE — Telephone Encounter (Signed)
Phone call from pt. She states she was given a prescription for Prednisone and Valium and she wants to be sure you already know she is on Xanax. She is requesting a refill on her Oxycodone since she has an appt in the area tomorrow. Finally she is having trouble getting into Dr Lezlie Lye office ( I believe that 's the name she said), they are not returning her phone calls. Please advise on all. thanks

## 2013-02-12 NOTE — Telephone Encounter (Signed)
Pred taper x6 days for inflammation and Valium every 8 hours as needed for spasm. Patient needs to contact her neurosurgeon Dr. Venetia Maxon for followup regarding this problem

## 2013-02-12 NOTE — Telephone Encounter (Signed)
Okay to use Valium short-term as muscle relaxer in addition to chronically ongoing Xanax - or if patient prefers a different muscle relaxer, please let me know I will make referral to Dr. Fredrich Birks office on behalf of the patient - our office will call regarding same Ok for oxy refill

## 2013-02-13 NOTE — Telephone Encounter (Signed)
Lm on pt voicemail regarding doctor comments and rx can be picked up and call with any questions she may have.

## 2013-02-17 ENCOUNTER — Encounter: Payer: Self-pay | Admitting: Internal Medicine

## 2013-02-25 ENCOUNTER — Telehealth: Payer: Self-pay | Admitting: Internal Medicine

## 2013-02-25 DIAGNOSIS — M5416 Radiculopathy, lumbar region: Secondary | ICD-10-CM

## 2013-02-25 NOTE — Telephone Encounter (Signed)
Change refer to [facility] ([facility] surg) Let me know if you need new "order" thanks

## 2013-02-25 NOTE — Telephone Encounter (Signed)
Pt has been terminated for to many no show  appt form [place]. Please advise

## 2013-02-26 NOTE — Telephone Encounter (Signed)
I will need new referral.  Thanks

## 2013-02-26 NOTE — Telephone Encounter (Signed)
done

## 2013-03-05 ENCOUNTER — Other Ambulatory Visit: Payer: Self-pay | Admitting: *Deleted

## 2013-03-05 MED ORDER — TORSEMIDE 100 MG PO TABS: 100.0000 mg | ORAL_TABLET | Freq: Every day | ORAL | Status: DC

## 2013-03-05 NOTE — Telephone Encounter (Signed)
R'cd fax from [facility] for refill of Torsemide.

## 2013-03-12 ENCOUNTER — Other Ambulatory Visit: Payer: Self-pay | Admitting: Internal Medicine

## 2013-03-12 MED ORDER — OXYCODONE HCL 10 MG PO TABS: 5.0000 mg | ORAL_TABLET | Freq: Three times a day (TID) | ORAL | Status: DC | PRN

## 2013-03-14 ENCOUNTER — Ambulatory Visit: Payer: Medicare Other | Admitting: Internal Medicine

## 2013-03-17 ENCOUNTER — Telehealth: Payer: Self-pay | Admitting: Pulmonary Disease

## 2013-03-17 NOTE — Telephone Encounter (Signed)
Spoke with [name] She is scheduled to have D & C done per Dr Jackelyn Knife She is needing pulmonary clearance for this Please advise thanks!

## 2013-03-17 NOTE — Telephone Encounter (Signed)
lmtcb x1 for pt. 

## 2013-03-18 ENCOUNTER — Encounter: Payer: Self-pay | Admitting: Internal Medicine

## 2013-03-18 NOTE — Telephone Encounter (Signed)
Spoke with pt and made aware that Dr Shelle Iron sent a fax regarding surgical clearance to Dr Jackelyn Knife

## 2013-03-18 NOTE — Telephone Encounter (Signed)
Done.  Quick fax sent to surgeon.

## 2013-03-19 ENCOUNTER — Encounter: Payer: Self-pay | Admitting: Cardiology

## 2013-03-19 ENCOUNTER — Ambulatory Visit (INDEPENDENT_AMBULATORY_CARE_PROVIDER_SITE_OTHER): Payer: PRIVATE HEALTH INSURANCE | Admitting: Cardiology

## 2013-03-19 VITALS — BP 126/78 | HR 87 | Ht 68.0 in | Wt 297.8 lb

## 2013-03-19 DIAGNOSIS — R0789 Other chest pain: Secondary | ICD-10-CM | POA: Insufficient documentation

## 2013-03-19 DIAGNOSIS — R002 Palpitations: Secondary | ICD-10-CM | POA: Insufficient documentation

## 2013-03-19 NOTE — Progress Notes (Signed)
HPI  patient is seen today for the evaluation of palpitations and chest discomfort. She has significant lung disease. Her echo was over the years have actually shown good systolic function. Recently she's noted some mild palpitations. She has not had syncope or presyncope. She also had an episode of chest pain in the middle of the night. This has not reoccurred. She's not having any exertional symptoms.  Allergies  Allergen Reactions  . Ciprofloxacin     REACTION: nausea  . Latex     REACTION: rash, burning  . Sulfamethoxazole W-Trimethoprim     Bactrim REACTION: Projectile Vomiting  . Azithromycin     REACTION: resistant  . Doxycycline     REACTION: restistant  . Metformin And Related Other (See Comments)    Severe diarrhea  . Metronidazole Nausea And Vomiting    Current Outpatient Prescriptions  Medication Sig Dispense Refill  . albuterol (PROVENTIL HFA;VENTOLIN HFA) 108 (90 BASE) MCG/ACT inhaler Inhale 2 puffs into the lungs every 6 (six) hours as needed. For wheeze or shortness of breath  1 Inhaler  0  . albuterol (PROVENTIL) (2.5 MG/3ML) 0.083% nebulizer solution USE 1 VIAL VIA NEBULIZER EVERY 6 HOURS AS NEEDED FOR WHEEZE OR SHORTNESS OF BREATH  75 mL  4  . ALPRAZolam (XANAX) 1 MG tablet TAKE 1 TABLET BY MOUTH TWICE DAILY AS NEEDED  60 tablet  5  . budesonide-formoterol (SYMBICORT) 160-4.5 MCG/ACT inhaler Inhale 2 puffs into the lungs 2 (two) times daily.  1 Inhaler  2  . cetirizine (ZYRTEC) 1 MG/ML syrup Take 2 teaspoonful by mouth at bedtime  300 mL  0  . cetirizine (ZYRTEC) 10 MG tablet Take 10 mg by mouth daily.      Marland Kitchen esomeprazole (NEXIUM) 40 MG capsule Take 1 capsule (40 mg total) by mouth 2 (two) times daily.  60 capsule  5  . glimepiride (AMARYL) 1 MG tablet Take 1 mg by mouth daily before breakfast.      . insulin glargine (LANTUS) 100 UNIT/ML injection Inject 16 Units into the skin at bedtime.       Marland Kitchen ipratropium (ATROVENT) 0.02 % nebulizer solution Take 2.5 mLs (0.5  mg total) by nebulization every 6 (six) hours.  75 mL  0  . mometasone (NASONEX) 50 MCG/ACT nasal spray Place 2 sprays into the nose daily.      . nicotine (NICODERM CQ - DOSED IN MG/24 HOURS) 14 mg/24hr patch Place 1 patch onto the skin daily.  28 patch  0  . Oxycodone HCl 10 MG TABS Take 0.5-1 tablets (5-10 mg total) by mouth 3 (three) times daily as needed.  90 tablet  0  . potassium chloride SA (K-DUR,KLOR-CON) 20 MEQ tablet Take 2 tablets (40 mEq total) by mouth 3 (three) times daily.  180 tablet  3  . predniSONE (STERAPRED UNI-PAK) 10 MG tablet Take 1 tablet (10 mg total) by mouth as directed. As directed x 6 days  21 tablet  0  . sertraline (ZOLOFT) 25 MG tablet Take 25 mg by mouth daily.      Marland Kitchen spironolactone (ALDACTONE) 25 MG tablet Take 25 mg by mouth 2 (two) times daily.      Marland Kitchen tiotropium (SPIRIVA) 18 MCG inhalation capsule Place 18 mcg into inhaler and inhale daily.      Marland Kitchen torsemide (DEMADEX) 100 MG tablet Take 1 tablet (100 mg total) by mouth daily.  30 tablet  3   No current facility-administered medications for this visit.  History   Social History  . Marital Status: Single    Spouse Name: N/A    Number of Children: N/A  . Years of Education: N/A   Occupational History  . Not on file.   Social History Main Topics  . Smoking status: Former Smoker -- 1.00 packs/day for 29 years    Types: Cigarettes    Quit date: 01/20/2013  . Smokeless tobacco: Never Used     Comment: Pt states she quit smoking 2-3 weeks ago. Resumed smoking September 2013 --2-3 CIGS DAILY AND E-CIG  . Alcohol Use: Yes     Comment: occasionally  . Drug Use: No  . Sexually Active: Not on file   Other Topics Concern  . Not on file   Social History Narrative   Pt is single. No children, and disaability    Family History  Problem Relation Age of Onset  . Hypertension Mother   . Emphysema Father   . Hypertension Father   . Stomach cancer Father   . Allergies Brother   . Hypertension Brother    . Stomach cancer Brother   . Heart disease Mother   . Heart disease Father   . Heart disease Brother     Past Medical History  Diagnosis Date  . Sleep apnea     noncompliant w/ CPAP  . Hypertension   . Hyperlipidemia   . Chronic headache   . Fibromyalgia     daily narcotics  . Anxiety     hx chronic BZ use, stopped 07/2010  . Anemia   . Pulmonary sarcoidosis     unimpressive CT chest 2011  . Colonic polyp   . GERD (gastroesophageal reflux disease)   . ALLERGIC RHINITIS   . Asthma   . CHF (congestive heart failure)     Diastolic with fluid overload, May, 2012, LVEF 60%  . Morbid obesity   . Depression   . Panic attacks   . Diabetes mellitus   . COPD (chronic obstructive pulmonary disease)     on home O2, moderate airflow obstruction, suspect d/t emphysema  . Obesity   . Elevated LFTs 09/2011  . Ovarian cyst   . Chronic back pain     Past Surgical History  Procedure Laterality Date  . Polypectomy  2011  . Lumbar microdiscectomy  07/06/2011    R L4-5, stern  . Back surgery    . Carpal tunnel release    . Steroid spinal injections      Patient Active Problem List   Diagnosis Date Noted  . Acute-on-chronic respiratory failure 01/05/2013  . Tobacco abuse 09/01/2012  . Knee pain, right 09/01/2012  . Low back pain 06/20/2012  . Diabetes type 2, uncontrolled 12/16/2011  . Obesity hypoventilation syndrome 04/14/2011  . Ejection fraction   . Sciatica of right side 02/17/2011  . OBSTRUCTIVE SLEEP APNEA 12/07/2010  . Morbid obesity 08/26/2010  . Chronic diastolic heart failure 08/26/2010  . ALLERGIC RHINITIS 08/26/2010  . GERD 08/26/2010  . AMENORRHEA 08/26/2010  . FIBROMYALGIA 08/26/2010  . CHRONIC RESPIRATORY FAILURE 08/17/2010  . PULMONARY SARCOIDOSIS 04/26/2010  . HYPERLIPIDEMIA 04/26/2010  . ANEMIA 04/26/2010  . ANXIETY 04/26/2010  . HYPERTENSION 04/26/2010  . COPD 04/26/2010  . HEADACHE, CHRONIC 04/26/2010    ROS   patient denies fever, chills,  headache, sweats, rash, change in vision, change in hearing, cough, nausea vomiting, urinary symptoms. All other systems are reviewed and are negative.  PHYSICAL EXAM  patient is overweight. She is oriented to  person time and place. Affect is normal. Lungs reveal decreased breath sounds. Cardiac exam reveals S1 and S2. There no clicks or significant murmurs. The abdomen is soft. There is no peripheral edema.  Filed Vitals:   03/19/13 1536  BP: 126/78  Pulse: 87  Height: 5\' 8"  (1.727 m)  Weight: 297 lb 12.8 oz (135.081 kg)   EKG is done today and reviewed by me. There is normal sinus rhythm. There is no significant change.  ASSESSMENT & PLAN

## 2013-03-19 NOTE — Assessment & Plan Note (Signed)
The patient is having some chest tightness. This occurred in the middle the night. At this point I'm not convinced of ischemia. I have reassured her. I will plan to see her back for followup to see if she has more symptoms over time.

## 2013-03-19 NOTE — Assessment & Plan Note (Signed)
She's had some mild palpitations. She has not had syncope or presyncope. With her severe lung disease we cannot use a beta blocker. Therefore I've chosen not to start any medication. I believe her palpitations are limited. I will plan to see her back. If she has more symptoms, I will consider monitoring her.

## 2013-03-19 NOTE — Patient Instructions (Addendum)
Your physician recommends that you continue on your current medications as directed. Please refer to the Current Medication list given to you today.  Your physician recommends that you schedule a follow-up appointment in: 2 months 

## 2013-03-20 ENCOUNTER — Telehealth: Payer: Self-pay | Admitting: *Deleted

## 2013-03-20 DIAGNOSIS — M549 Dorsalgia, unspecified: Secondary | ICD-10-CM

## 2013-03-20 NOTE — Telephone Encounter (Signed)
Lafonda Mosses from [facility] called requesting an order for pt to return to Physical Therapy.  Please advise [phone]

## 2013-03-20 NOTE — Telephone Encounter (Signed)
Verbal ok?

## 2013-03-21 ENCOUNTER — Other Ambulatory Visit: Payer: Self-pay

## 2013-03-21 ENCOUNTER — Encounter (HOSPITAL_COMMUNITY)
Admission: RE | Admit: 2013-03-21 | Discharge: 2013-03-21 | Disposition: A | Discharge status: Home / Self Care | Payer: PRIVATE HEALTH INSURANCE | Source: Ambulatory Visit | Attending: Obstetrics and Gynecology | Admitting: Obstetrics and Gynecology

## 2013-03-21 ENCOUNTER — Ambulatory Visit: Payer: Medicare Other | Admitting: Internal Medicine

## 2013-03-21 ENCOUNTER — Emergency Department (HOSPITAL_COMMUNITY)
Admission: EM | Admit: 2013-03-21 | Discharge: 2013-03-21 | Disposition: A | Discharge status: Home / Self Care | Payer: PRIVATE HEALTH INSURANCE | Attending: Emergency Medicine | Admitting: Emergency Medicine

## 2013-03-21 ENCOUNTER — Encounter (HOSPITAL_COMMUNITY): Payer: Self-pay

## 2013-03-21 DIAGNOSIS — K219 Gastro-esophageal reflux disease without esophagitis: Secondary | ICD-10-CM | POA: Insufficient documentation

## 2013-03-21 DIAGNOSIS — Z139 Encounter for screening, unspecified: Secondary | ICD-10-CM

## 2013-03-21 DIAGNOSIS — I509 Heart failure, unspecified: Secondary | ICD-10-CM | POA: Insufficient documentation

## 2013-03-21 DIAGNOSIS — Z87891 Personal history of nicotine dependence: Secondary | ICD-10-CM | POA: Insufficient documentation

## 2013-03-21 DIAGNOSIS — IMO0002 Reserved for concepts with insufficient information to code with codable children: Secondary | ICD-10-CM | POA: Insufficient documentation

## 2013-03-21 DIAGNOSIS — F411 Generalized anxiety disorder: Secondary | ICD-10-CM | POA: Insufficient documentation

## 2013-03-21 DIAGNOSIS — Z8742 Personal history of other diseases of the female genital tract: Secondary | ICD-10-CM | POA: Insufficient documentation

## 2013-03-21 DIAGNOSIS — Z860100 Personal history of colon polyps, unspecified: Secondary | ICD-10-CM | POA: Insufficient documentation

## 2013-03-21 DIAGNOSIS — I10 Essential (primary) hypertension: Secondary | ICD-10-CM | POA: Insufficient documentation

## 2013-03-21 DIAGNOSIS — Z794 Long term (current) use of insulin: Secondary | ICD-10-CM | POA: Insufficient documentation

## 2013-03-21 DIAGNOSIS — D860 Sarcoidosis of lung: Secondary | ICD-10-CM

## 2013-03-21 DIAGNOSIS — Z79899 Other long term (current) drug therapy: Secondary | ICD-10-CM | POA: Insufficient documentation

## 2013-03-21 DIAGNOSIS — Z8739 Personal history of other diseases of the musculoskeletal system and connective tissue: Secondary | ICD-10-CM | POA: Insufficient documentation

## 2013-03-21 DIAGNOSIS — J441 Chronic obstructive pulmonary disease with (acute) exacerbation: Secondary | ICD-10-CM | POA: Insufficient documentation

## 2013-03-21 DIAGNOSIS — F329 Major depressive disorder, single episode, unspecified: Secondary | ICD-10-CM | POA: Insufficient documentation

## 2013-03-21 DIAGNOSIS — D869 Sarcoidosis, unspecified: Secondary | ICD-10-CM | POA: Insufficient documentation

## 2013-03-21 DIAGNOSIS — E119 Type 2 diabetes mellitus without complications: Secondary | ICD-10-CM | POA: Insufficient documentation

## 2013-03-21 DIAGNOSIS — Z862 Personal history of diseases of the blood and blood-forming organs and certain disorders involving the immune mechanism: Secondary | ICD-10-CM | POA: Insufficient documentation

## 2013-03-21 DIAGNOSIS — Z8639 Personal history of other endocrine, nutritional and metabolic disease: Secondary | ICD-10-CM | POA: Insufficient documentation

## 2013-03-21 DIAGNOSIS — F3289 Other specified depressive episodes: Secondary | ICD-10-CM | POA: Insufficient documentation

## 2013-03-21 DIAGNOSIS — Z91040 Latex allergy status: Secondary | ICD-10-CM | POA: Insufficient documentation

## 2013-03-21 DIAGNOSIS — J99 Respiratory disorders in diseases classified elsewhere: Secondary | ICD-10-CM | POA: Insufficient documentation

## 2013-03-21 NOTE — Telephone Encounter (Signed)
referrral faxed to Lafonda Mosses at [phone] as per Dr Diamantina Monks order.

## 2013-03-21 NOTE — ED Notes (Signed)
Pt. Up to bedside commode. Pt. O2 dropped to 84% on 2L. Pt. Took a couple deep breaths and oxygen came back up to 95%. Pt. States she felt SOB. States normally wears 3L when ambulating.

## 2013-03-21 NOTE — Pre-Procedure Instructions (Signed)
PAT assessment not completed because pt c/o not feeling well, stated she needed to go to ER. Has history of CHF, COPD, diabetes. Wt today was 305, pt states wt yesterday was 297. Discussed pt with Dr. Malen Gauze who advised that pt be evaluated for CHF. I called Johnnye Sima, house coverage, who advised calling EMS. Pt transported to [facility] ER via EMS. Dr Jackelyn Knife notified.

## 2013-03-21 NOTE — ED Provider Notes (Signed)
History    CSN: [id number] Arrival date & time 03/21/13  1551  First MD Initiated Contact with Patient 03/21/13 1604     Chief Complaint  Patient presents with  . Shortness of Breath   (Consider location/radiation/quality/duration/timing/severity/associated sxs/prior Treatment) HPI Comments: Pt reports she was at pre-op at [facility] for a D&C procedure. Anesthesiologist sent her to [facility] to be evaluated for CHF.  Pt is on 2L [place] always, up to 3 L when walking.  She has a former h/o drug use in the 1980's, she has dilated cardiomyopathy and severe pulmonary sarcoidosis and a h/o COPD.  She is taking all of her inhalers and I have verified a visit to Dr. Shelle Iron in May.  She just saw Dr. Myrtis Ser with [facility] cardiology on 6/25 and pt cannot be on beta blocker due to lung disease.  She is not on fluid pills. She reports she may have gained a few pounds in the past few days, has felt more SOB than usual the past 1-2 days.  However, no fevers, chills, CP, cough, URI symptoms.  Pt tells me she feels ok to go home.    Patient is a 47 y.o. female presenting with shortness of breath. The history is provided by the patient and medical records.  Shortness of Breath Associated symptoms: no chest pain, no cough and no diaphoresis    Past Medical History  Diagnosis Date  . Sleep apnea     noncompliant w/ CPAP  . Hypertension   . Hyperlipidemia   . Chronic headache   . Fibromyalgia     daily narcotics  . Anxiety     hx chronic BZ use, stopped 07/2010  . Anemia   . Pulmonary sarcoidosis     unimpressive CT chest 2011  . Colonic polyp   . GERD (gastroesophageal reflux disease)   . ALLERGIC RHINITIS   . Asthma   . CHF (congestive heart failure)     Diastolic with fluid overload, May, 2012, LVEF 60%  . Morbid obesity   . Depression   . Panic attacks   . Diabetes mellitus   . COPD (chronic obstructive pulmonary disease)     on home O2, moderate airflow obstruction, suspect d/t emphysema  . Obesity    . Elevated LFTs 09/2011  . Ovarian cyst   . Chronic back pain   . Chest tightness     June, 2014  . Palpitation     June, 2014   Past Surgical History  Procedure Laterality Date  . Polypectomy  2011  . Lumbar microdiscectomy  07/06/2011    R L4-5, stern  . Back surgery    . Carpal tunnel release    . Steroid spinal injections     Family History  Problem Relation Age of Onset  . Hypertension Mother   . Emphysema Father   . Hypertension Father   . Stomach cancer Father   . Allergies Brother   . Hypertension Brother   . Stomach cancer Brother   . Heart disease Mother   . Heart disease Father   . Heart disease Brother    History  Substance Use Topics  . Smoking status: Former Smoker -- 1.00 packs/day for 29 years    Types: Cigarettes    Quit date: 01/20/2013  . Smokeless tobacco: Never Used     Comment: Pt states she quit smoking 2-3 weeks ago. Resumed smoking September 2013 --2-3 CIGS DAILY AND E-CIG  . Alcohol Use: Yes  Comment: occasionally   OB History   Grav Para Term Preterm Abortions TAB SAB Ect Mult Living                 Review of Systems  Constitutional: Negative for diaphoresis and activity change.  HENT: Negative for congestion and rhinorrhea.   Respiratory: Positive for shortness of breath. Negative for cough and chest tightness.   Cardiovascular: Negative for chest pain.  All other systems reviewed and are negative.    Allergies  Ciprofloxacin; Latex; Sulfamethoxazole w-trimethoprim; Azithromycin; Doxycycline; Metformin and related; and Metronidazole  Home Medications   Current Outpatient Rx  Name  Route  Sig  Dispense  Refill  . albuterol (PROVENTIL HFA;VENTOLIN HFA) 108 (90 BASE) MCG/ACT inhaler   Inhalation   Inhale 2 puffs into the lungs every 6 (six) hours as needed. For wheeze or shortness of breath   1 Inhaler   0   . albuterol (PROVENTIL) (2.5 MG/3ML) 0.083% nebulizer solution      USE 1 VIAL VIA NEBULIZER EVERY 6 HOURS AS  NEEDED FOR WHEEZE OR SHORTNESS OF BREATH   75 mL   4   . ALPRAZolam (XANAX) 1 MG tablet      TAKE 1 TABLET BY MOUTH TWICE DAILY AS NEEDED   60 tablet   5   . budesonide-formoterol (SYMBICORT) 160-4.5 MCG/ACT inhaler   Inhalation   Inhale 2 puffs into the lungs 2 (two) times daily.   1 Inhaler   2   . cetirizine (ZYRTEC) 1 MG/ML syrup      Take 2 teaspoonful by mouth at bedtime   300 mL   0   . cetirizine (ZYRTEC) 10 MG tablet   Oral   Take 10 mg by mouth daily.         Marland Kitchen esomeprazole (NEXIUM) 40 MG capsule   Oral   Take 1 capsule (40 mg total) by mouth 2 (two) times daily.   60 capsule   5   . glimepiride (AMARYL) 1 MG tablet   Oral   Take 1 mg by mouth daily before breakfast.         . insulin glargine (LANTUS) 100 UNIT/ML injection   Subcutaneous   Inject 16 Units into the skin at bedtime.          Marland Kitchen ipratropium (ATROVENT) 0.02 % nebulizer solution   Nebulization   Take 2.5 mLs (0.5 mg total) by nebulization every 6 (six) hours.   75 mL   0   . mometasone (NASONEX) 50 MCG/ACT nasal spray   Nasal   Place 2 sprays into the nose daily.         . nicotine (NICODERM CQ - DOSED IN MG/24 HOURS) 14 mg/24hr patch   Transdermal   Place 1 patch onto the skin daily.   28 patch   0   . Oxycodone HCl 10 MG TABS   Oral   Take 0.5-1 tablets (5-10 mg total) by mouth 3 (three) times daily as needed.   90 tablet   0     Fill on or after 03/15/13   . potassium chloride SA (K-DUR,KLOR-CON) 20 MEQ tablet   Oral   Take 2 tablets (40 mEq total) by mouth 3 (three) times daily.   180 tablet   3   . predniSONE (STERAPRED UNI-PAK) 10 MG tablet   Oral   Take 1 tablet (10 mg total) by mouth as directed. As directed x 6 days   21 tablet   0   .  sertraline (ZOLOFT) 25 MG tablet   Oral   Take 25 mg by mouth daily.         Marland Kitchen spironolactone (ALDACTONE) 25 MG tablet   Oral   Take 25 mg by mouth 2 (two) times daily.         Marland Kitchen tiotropium (SPIRIVA) 18 MCG  inhalation capsule   Inhalation   Place 18 mcg into inhaler and inhale daily.         Marland Kitchen torsemide (DEMADEX) 100 MG tablet   Oral   Take 1 tablet (100 mg total) by mouth daily.   30 tablet   3    BP 124/65  Pulse 85  Temp(Src) 98 F (36.7 C) (Oral)  Resp 21  SpO2 97% Physical Exam  Nursing note and vitals reviewed. Constitutional: She is oriented to person, place, and time. She appears well-developed and well-nourished.  Non-toxic appearance. She does not have a sickly appearance. She does not appear ill. No distress.  HENT:  Head: Normocephalic and atraumatic.  Eyes: EOM are normal.  Neck: Normal range of motion. Neck supple.  Cardiovascular: Normal rate, regular rhythm and intact distal pulses.   Pulmonary/Chest: Effort normal. No respiratory distress. She has wheezes. She has no rales.  Abdominal: Soft.  Musculoskeletal: She exhibits no edema.  Neurological: She is alert and oriented to person, place, and time.  Skin: Skin is warm.    ED Course  Procedures (including critical care time) Labs Reviewed - No data to display No results found. 1. Screening   2. Sarcoidosis of lung      Saturation on [place] O2 is 98% and I interpret to be adequate.  I spoke to Dr. Jackelyn Knife who is aware of pt's desire to go home and follow up as outpt.   MDM  Pt with saturations of 98% and 2L of [place] O2.  Pt has no complaints at present. I am unclear to why she was sent to the ED.  There are no notes in [facility] dated today.  I reviewed most recent phone notes with Dr. Felicity Coyer, cardiology note from 6/25 and Dr. Teddy Spike note from May 2014.  Pt would like to go home.  Pt has minimal wheeze on exam, but is in no distress at present.    Gavin Pound. Oletta Lamas, MD 03/21/13 2344

## 2013-03-21 NOTE — ED Notes (Addendum)
Pt. C/o exertional dyspnea x2 days and general "not feeling well". States weighs 5lbs heavier than she did yesterday. Pt. 98% on her normal 2L O2. Denies dyspnea at rest or chest pain. Denies fevers.

## 2013-03-21 NOTE — ED Notes (Signed)
Per EMS, pt increased exertional SOB x2 days. 88% RA. Pt. Normally wears 2L O2 at home.

## 2013-03-21 NOTE — Telephone Encounter (Signed)
[name] states she needs a written Rx, please.  May be faxed to [phone].

## 2013-03-21 NOTE — Telephone Encounter (Signed)
Order place in EMR - [facility] can arrange

## 2013-03-21 NOTE — Discharge Instructions (Signed)
Sarcoidosis, Schaumann's Disease, Sarcoid of [place]  Sarcoidosis appears briefly and heals naturally in 60 to 70 percent of cases, often without the patient knowing or doing anything about it. 20 to 30 percent of patients with sarcoidosis are left with some permanent lung damage. In 10 to 15 percent of the patients, sarcoidosis can become chronic (long lasting). When either the granulomas or fibrosis seriously affect the function of a vital organ (lungs, heart, nervous system, liver, or kidneys), sarcoidosis can be fatal. This occurs 5 to 10 percent of the time. No one can predict how sarcoidosis will progress in an individual patient. The symptoms the patient experiences, the caregiver's findings, and the patient's race can give some clues.  Sarcoidosis was once considered a rare disease. We now know that it is a common chronic illness that appears all over the world. It is the most common of the fibrotic (scarring) lung disorders. Anyone can get sarcoidosis. It occurs in all races and in both sexes. The risk is greater if you are a young black adult, especially a black woman, or are of Scandinavian, German, Irish, or Puerto [place] origin.  In sarcoidosis, small lumps (also called nodules or granulomas) develop in multiple organs of the body. These granulomas are small collections of inflamed cells. They commonly appear in the lungs. This is the most common organ affected. They also occur in the lymph nodes (your glands), skin, liver, and eyes. The granulomas vary in the amount of disease they produce from very little with no problems (symptoms) to causing severe illness. The cause of sarcoidosis is not known. It may be due to an abnormal immune reaction in the body. Most people will recover. A few people will develop long lasting conditions that may get worse. Women are affected more often than men. The majority of those affected are under forty years of age. Because we do not know the cause, we do not have ways to  prevent it.  SYMPTOMS   · Fever.  · Loss of appetite.  · Night sweats.  · Joint pain.  · Aching muscles  Symptoms vary because the disease affects different parts of the body in different people. Most people who see their caregiver with sarcoidosis have lung problems. The first signs are usually a dry cough and shortness of breath. There may also be wheezing, chest pain, or a cough that brings up bloody mucus. In severe cases, lung function may become so poor that the person cannot perform even the simple routine tasks of daily life.  Other symptoms of sarcoidosis are less common than lung symptoms. They can include:  · Skin symptoms. Sarcoidosis can appear as a collection of tender, red bumps called erythema nodosum. These bumps usually occur on the face, shins, and arms. They can also occur as a scaly, purplish discoloration on the nose, cheeks, and ears. This is called lupus pernio. Less often, sarcoidosis causes cysts, pimples, or disfiguring over growths of skin. In many cases, the disfiguring over growths develop in areas of scars or tattoos.  · Eye symptoms. These include redness, eye pain, and sensitivity to light.  · Heart symptoms. These include irregular heartbeat and heart failure.  · Other symptoms. A person may have paralyzed facial muscles, seizures, psychiatric symptoms, swollen salivary glands, or bone pain.  DIAGNOSIS   Even when there are no symptoms, your caregiver can sometimes pick up signs of sarcoidosis during a routine examination, usually through a chest x-ray or when checking other complaints. The patient's age   and race or ethnic group can raise an additional red flag that a sign or symptom could be related to sarcoidosis.   · Enlargement of the salivary or tear glands and cysts in bone tissue may also be caused by sarcoidosis.  · You may have had a biopsy done that shows signs of sarcoidosis. A biopsy is a small tissue sample that is removed for laboratory testing. This tissue sample can  be taken from your lung, skin, lip, or another inflamed or abnormal area of the body.  · You may have had an abnormal chest X-ray. Although you appear healthy, a chest X-ray ordered for other reasons may turn up abnormalities that suggest sarcoidosis.  · Other tests may be needed. These tests may be done to rule out other illnesses or to determine the amount of organ damage caused by sarcoidosis. Some of the most common tests are:  · Blood levels of calcium or angiotensin-converting enzyme may be high in people with sarcoidosis.  · Blood tests to evaluate how well your liver is functioning.  · Lung function tests to measure how well you are breathing.  · A complete eye examination.  TREATMENT   If sarcoidosis does not cause any problems, treatment may not be necessary. Your caregiver may decide to simply monitor your condition. As part of this monitoring process, you may have frequent office visits, follow-up chest X-rays, and tests of your lung function.If you have signs of moderate or severe lung disease, your doctor may recommend:  · A corticosteroid drug, such as prednisone (sold under several brand names).  · Corticosteroids also are used to treat sarcoidosis of the eyes, joints, skin, nerves, or heart.  · Corticosteroid eye drops may be used for the eyes.  · Over-the-counter medications like nonsteroidal anti-inflammatory drugs (NSAID) often are used to treat joint pain first before corticosteroids, which tend to have more side effects.  · If corticosteroids are not effective or cause serious side effects, other drugs that alter or suppress the immune system may be used.  · In rare cases, when sarcoidosis causes life-threatening lung disease, a lung transplant may be necessary. However, there is some risk that the new lungs also will be attacked by sarcoidosis.  SEEK IMMEDIATE MEDICAL CARE IF:   · You suffer from shortness of breath or a lingering cough.  · You develop new problems that may be related to the  disease. Remember this disease can affect almost all organs of the body and cause many different problems.  Document Released: 07/12/2004 Document Revised: 12/04/2011 Document Reviewed: 12/20/2005  [employer]® Patient Information ©2014 [employer].

## 2013-03-24 ENCOUNTER — Encounter (HOSPITAL_COMMUNITY): Payer: Self-pay | Admitting: *Deleted

## 2013-03-24 NOTE — Pre-Procedure Instructions (Signed)
Dr. Sherron Ales has been updated re pt's ER visit on 03/21/13 advised that Dr. Jackelyn Knife be notified that surgery will probably call for spinal anes.( Dr. Jackelyn Knife notified.) I called pt and finished her PAT assessment and gave LSD inst per phone. I inst pt to call [facility] if her blood sugar is abnormal in the am.

## 2013-03-25 ENCOUNTER — Encounter (HOSPITAL_COMMUNITY): Payer: Self-pay

## 2013-03-25 ENCOUNTER — Ambulatory Visit (HOSPITAL_COMMUNITY)
Admission: RE | Admit: 2013-03-25 | Discharge: 2013-03-25 | Disposition: A | Discharge status: Home / Self Care | Payer: PRIVATE HEALTH INSURANCE | Source: Ambulatory Visit | Attending: Obstetrics and Gynecology | Admitting: Obstetrics and Gynecology

## 2013-03-25 ENCOUNTER — Ambulatory Visit (HOSPITAL_COMMUNITY): Payer: PRIVATE HEALTH INSURANCE | Admitting: Anesthesiology

## 2013-03-25 ENCOUNTER — Encounter (HOSPITAL_COMMUNITY): Payer: Self-pay | Admitting: Anesthesiology

## 2013-03-25 ENCOUNTER — Encounter (HOSPITAL_COMMUNITY)
Admission: RE | Disposition: A | Discharge status: Home / Self Care | Payer: Self-pay | Source: Ambulatory Visit | Attending: Obstetrics and Gynecology

## 2013-03-25 DIAGNOSIS — N949 Unspecified condition associated with female genital organs and menstrual cycle: Secondary | ICD-10-CM | POA: Insufficient documentation

## 2013-03-25 DIAGNOSIS — M549 Dorsalgia, unspecified: Secondary | ICD-10-CM | POA: Insufficient documentation

## 2013-03-25 DIAGNOSIS — N938 Other specified abnormal uterine and vaginal bleeding: Secondary | ICD-10-CM | POA: Insufficient documentation

## 2013-03-25 DIAGNOSIS — N882 Stricture and stenosis of cervix uteri: Secondary | ICD-10-CM | POA: Insufficient documentation

## 2013-03-25 DIAGNOSIS — N939 Abnormal uterine and vaginal bleeding, unspecified: Secondary | ICD-10-CM | POA: Diagnosis present

## 2013-03-25 DIAGNOSIS — N840 Polyp of corpus uteri: Secondary | ICD-10-CM | POA: Insufficient documentation

## 2013-03-25 DIAGNOSIS — R51 Headache: Secondary | ICD-10-CM | POA: Insufficient documentation

## 2013-03-25 HISTORY — PX: HYSTEROSCOPY W/D&C: SHX1775

## 2013-03-25 LAB — CBC
HCT: 44.8 % (ref 36.0–46.0)
Hemoglobin: 14.8 g/dL (ref 12.0–15.0)
MCH: 26.7 pg (ref 26.0–34.0)
MCHC: 33 g/dL (ref 30.0–36.0)
MCV: 80.7 fL (ref 78.0–100.0)
Platelets: 239 K/uL (ref 150–400)
RBC: 5.55 MIL/uL — ABNORMAL HIGH (ref 3.87–5.11)
RDW: 17.4 % — ABNORMAL HIGH (ref 11.5–15.5)
WBC: 9.7 K/uL (ref 4.0–10.5)

## 2013-03-25 LAB — GLUCOSE, CAPILLARY: Glucose-Capillary: 107 mg/dL — ABNORMAL HIGH (ref 70–99)

## 2013-03-25 LAB — COMPREHENSIVE METABOLIC PANEL
ALT: 20 U/L (ref 0–35)
AST: 17 U/L (ref 0–37)
Albumin: 3.8 g/dL (ref 3.5–5.2)
CO2: 31 mEq/L (ref 19–32)
Calcium: 9.8 mg/dL (ref 8.4–10.5)
Chloride: 94 mEq/L — ABNORMAL LOW (ref 96–112)
GFR calc non Af Amer: 77 mL/min — ABNORMAL LOW (ref >90)
Sodium: 134 mEq/L — ABNORMAL LOW (ref 135–145)

## 2013-03-25 LAB — PREGNANCY, URINE: Preg Test, Ur: NEGATIVE

## 2013-03-25 LAB — HM PAP SMEAR

## 2013-03-25 SURGERY — DILATATION AND CURETTAGE /HYSTEROSCOPY
Anesthesia: Spinal | Site: Vagina | Wound class: Clean Contaminated

## 2013-03-25 MED ORDER — FENTANYL CITRATE 0.05 MG/ML IJ SOLN
25.0000 ug | INTRAMUSCULAR | Status: DC | PRN
  Administered 2013-03-25 (×3): 50 ug via INTRAVENOUS

## 2013-03-25 MED ORDER — LIDOCAINE HCL (CARDIAC) 20 MG/ML IV SOLN
INTRAVENOUS | Status: AC
  Filled 2013-03-25: qty 5

## 2013-03-25 MED ORDER — FENTANYL CITRATE 0.05 MG/ML IJ SOLN
INTRAMUSCULAR | Status: AC
  Filled 2013-03-25: qty 2

## 2013-03-25 MED ORDER — LACTATED RINGERS IV SOLN: INTRAVENOUS | Status: DC

## 2013-03-25 MED ORDER — ACETAMINOPHEN 160 MG/5ML PO SOLN
ORAL | Status: AC
  Administered 2013-03-25: 975 mg via ORAL
  Filled 2013-03-25: qty 20.3

## 2013-03-25 MED ORDER — MIDAZOLAM HCL 2 MG/2ML IJ SOLN
INTRAMUSCULAR | Status: AC
  Filled 2013-03-25: qty 2

## 2013-03-25 MED ORDER — IBUPROFEN 200 MG PO TABS
ORAL_TABLET | ORAL | Status: AC
  Administered 2013-03-25: 400 mg via ORAL
  Filled 2013-03-25: qty 2

## 2013-03-25 MED ORDER — GLYCINE 1.5 % IR SOLN
Status: DC | PRN
  Administered 2013-03-25: 3000 mL

## 2013-03-25 MED ORDER — MIDAZOLAM HCL 5 MG/5ML IJ SOLN
INTRAMUSCULAR | Status: DC | PRN
  Administered 2013-03-25: 2 mg via INTRAVENOUS

## 2013-03-25 MED ORDER — PROPOFOL 10 MG/ML IV EMUL
INTRAVENOUS | Status: AC
  Filled 2013-03-25: qty 20

## 2013-03-25 MED ORDER — LACTATED RINGERS IV SOLN
INTRAVENOUS | Status: DC
  Administered 2013-03-25: 50 mL/h via INTRAVENOUS
  Administered 2013-03-25: 14:00:00 via INTRAVENOUS

## 2013-03-25 MED ORDER — LIDOCAINE HCL 2 % IJ SOLN
INTRAMUSCULAR | Status: AC
  Filled 2013-03-25: qty 20

## 2013-03-25 MED ORDER — LIDOCAINE HCL 2 % IJ SOLN
INTRAMUSCULAR | Status: DC | PRN
  Administered 2013-03-25: 16 mL

## 2013-03-25 MED ORDER — FENTANYL CITRATE 0.05 MG/ML IJ SOLN
INTRAMUSCULAR | Status: DC | PRN
  Administered 2013-03-25 (×2): 50 ug via INTRAVENOUS

## 2013-03-25 MED ORDER — ONDANSETRON HCL 4 MG/2ML IJ SOLN
INTRAMUSCULAR | Status: DC | PRN
  Administered 2013-03-25: 4 mg via INTRAVENOUS

## 2013-03-25 MED ORDER — ONDANSETRON HCL 4 MG/2ML IJ SOLN
INTRAMUSCULAR | Status: AC
  Filled 2013-03-25: qty 2

## 2013-03-25 MED ORDER — IBUPROFEN 200 MG PO TABS: 400.0000 mg | ORAL_TABLET | Freq: Once | ORAL | Status: AC

## 2013-03-25 MED ORDER — ACETAMINOPHEN 160 MG/5ML PO SOLN: 975.0000 mg | Freq: Once | ORAL | Status: AC

## 2013-03-25 SURGICAL SUPPLY — 17 items
ABLATOR ENDOMETRIAL BIPOLAR (ABLATOR) IMPLANT
CANISTER SUCTION 2500CC (MISCELLANEOUS) ×2 IMPLANT
CATH ROBINSON RED A/P 16FR (CATHETERS) ×2 IMPLANT
CATH SILICONE 16FRX5CC (CATHETERS) ×2 IMPLANT
CLOTH BEACON ORANGE TIMEOUT ST (SAFETY) ×2 IMPLANT
CONTAINER PREFILL 10% NBF 60ML (FORM) ×4 IMPLANT
DRESSING TELFA 8X3 (GAUZE/BANDAGES/DRESSINGS) ×2 IMPLANT
ELECT REM PT RETURN 9FT ADLT (ELECTROSURGICAL)
ELECTRODE REM PT RTRN 9FT ADLT (ELECTROSURGICAL) IMPLANT
GLOVE BIO SURGEON STRL SZ8 (GLOVE) ×2 IMPLANT
GLOVE ORTHO TXT STRL SZ7.5 (GLOVE) ×2 IMPLANT
GOWN STRL REIN XL XLG (GOWN DISPOSABLE) ×4 IMPLANT
LOOP ANGLED CUTTING 22FR (CUTTING LOOP) IMPLANT
PACK HYSTEROSCOPY LF (CUSTOM PROCEDURE TRAY) ×2 IMPLANT
PAD OB MATERNITY 4.3X12.25 (PERSONAL CARE ITEMS) ×2 IMPLANT
TOWEL OR 17X24 6PK STRL BLUE (TOWEL DISPOSABLE) ×4 IMPLANT
WATER STERILE IRR 1000ML POUR (IV SOLUTION) ×2 IMPLANT

## 2013-03-25 NOTE — Interval H&P Note (Signed)
History and Physical Interval Note:  03/25/2013 11:57 AM  [name]  has presented today for surgery, with the diagnosis of abnormal uterine bleeding, [id number]  The various methods of treatment have been discussed with the patient and family. After consideration of risks, benefits and other options for treatment, the patient has consented to  Procedure(s): DILATATION AND CURETTAGE /HYSTEROSCOPY (N/A) as a surgical intervention .  The patient's history has been reviewed, patient examined, no change in status, stable for surgery.  I have reviewed the patient's chart and labs.  Questions were answered to the patient's satisfaction.     Joedy Eickhoff D

## 2013-03-25 NOTE — Anesthesia Procedure Notes (Signed)
Spinal  Patient location during procedure: OR Preanesthetic Checklist Completed: patient identified, site marked, surgical consent, pre-op evaluation, timeout performed, IV checked, risks and benefits discussed and monitors and equipment checked Spinal Block Patient position: sitting Prep: DuraPrep Patient monitoring: heart rate, cardiac monitor, continuous pulse ox and blood pressure Approach: midline Location: L3-4 Injection technique: single-shot Needle Needle type: Sprotte and Tuohy  Needle gauge: 24 G Needle length: 9 cm Assessment Sensory level: T8 Additional Notes Difficult spinal. Obesity and back panus made midline estimate challanging. Midline back surgery scar not midline with obesity shift. Skin local and then touhy used as conduit. No distinct LOR; but at 9 cm attempted sprotte thru touhy. Vague L sided parathesia. Moved more right sided approach with similar results. 2nd readjustment lead to (+) CSF thru touhy. Dosed spinal.  Spinal Dosage in OR  Bupivicaine ml       1.5

## 2013-03-25 NOTE — Transfer of Care (Signed)
Immediate Anesthesia Transfer of Care Note  Patient: [name]  Procedure(s) Performed: Procedure(s): DILATATION AND CURETTAGE /HYSTEROSCOPY (N/A)  Patient Location: PACU  Anesthesia Type:Spinal  Level of Consciousness: awake  Airway & Oxygen Therapy: Patient Spontanous Breathing and Patient connected to nasal cannula oxygen  Post-op Assessment: Report given to PACU RN  Post vital signs: Reviewed and stable  Complications: No apparent anesthesia complications

## 2013-03-25 NOTE — Anesthesia Postprocedure Evaluation (Signed)
  Anesthesia Post-op Note  Anesthesia Post Note  Patient: [name]  Procedure(s) Performed: Procedure(s) (LRB): DILATATION AND CURETTAGE /HYSTEROSCOPY (N/A)  Anesthesia type: Spinal  Patient location: PACU  Post pain: Pain level controlled  Post assessment: Post-op Vital signs reviewed  Last Vitals:  Filed Vitals:   03/25/13 1430  BP: 118/50  Pulse: 79  Temp:   Resp: 22    Post vital signs: Reviewed  Level of consciousness: awake  Complications: No apparent anesthesia complications

## 2013-03-25 NOTE — Anesthesia Preprocedure Evaluation (Addendum)
Anesthesia Evaluation  Patient identified by MRN, date of birth, ID band Patient awake    Reviewed: Allergy & Precautions, H&P , Patient's Chart, lab work & pertinent test results  History of Anesthesia Complications (+) PONV  Airway Mallampati: III TM Distance: >3 FB Neck ROM: full    Dental no notable dental hx.    Pulmonary shortness of breath, with exertion and Long-Term Oxygen Therapy, asthma , sleep apnea, Continuous Positive Airway Pressure Ventilation and Oxygen sleep apnea , COPD COPD inhaler and oxygen dependent,  breath sounds clear to auscultation  Pulmonary exam normal       Cardiovascular Exercise Tolerance: Good hypertension, DVT Rhythm:regular Rate:Normal     Neuro/Psych  Headaches,  Neuromuscular disease    GI/Hepatic GERD-  ,  Endo/Other  diabetesMorbid obesity  Renal/GU      Musculoskeletal   Abdominal   Peds  Hematology   Anesthesia Other Findings Sleep apnea   noncompliant w/ CPAP Rm air sat 90% 03/25/13  Hypertension   controlled     Hyperlipidemia      Chronic headache        Fibromyalgia   daily narcotics  Anxiety   hx chronic BZ use, stopped 07/2010    Anemia     Pulmonary sarcoidosis   unimpressive CT chest 2011 ... No steroids   GERD (gastroesophageal reflux disease)         Asthma        CHF (congestive heart failure)    Diastolic with fluid overload, May, 2012, LVEF 60% Morbid obesity        Depression      Panic attacks        Diabetes mellitus     COPD (chronic obstructive pulmonary disease)   on home O2, moderate airflow obstruction, suspect d/t emphysema    Obesity        Reproductive/Obstetrics                         Anesthesia Physical Anesthesia Plan  ASA: III  Anesthesia Plan: Spinal   Post-op Pain Management:    Induction:   Airway Management Planned:   Additional Equipment:   Intra-op Plan:   Post-operative Plan:   Informed Consent:  I have reviewed the patients History and Physical, chart, labs and discussed the procedure including the risks, benefits and alternatives for the proposed anesthesia with the patient or authorized representative who has indicated his/her understanding and acceptance.   Dental Advisory Given  Plan Discussed with: CRNA  Anesthesia Plan Comments: (Lab work confirmed with CRNA in room. Platelets okay. Discussed spinal anesthetic, and patient consents to the procedure:  included risk of possible headache,backache, failed block, allergic reaction, and nerve injury. This patient was asked if she had any questions or concerns before the procedure started. )        Anesthesia Quick Evaluation

## 2013-03-25 NOTE — Op Note (Signed)
Preoperative diagnosis: Abnormal uterine bleeding, possible endometrial polyp Postoperative diagnosis: Abnormal uterine bleeding Procedure: Hysteroscopy with dilation and curettage Surgeon: Lavina Hamman M.D. Anesthesia:  Spinal, paracervical block Findings: She had a normal endometrial cavity with a small amount of endometrial tissue and 2 possible small polyps, stenotic cervix Estimated blood loss: Minimal Fluid deficit: Through the hysteroscope fluid deficit was about 50 cc Specimens: Endometrial curettings sent for routine pathology Complications: None  Procedure in detail: The patient was taken to the operating room and placed in the dorsosupine position. General anesthesia was induced. She was placed in mobile stirrups and legs were elevated. Perineum and vagina were prepped and draped in the usual sterile fashion and bladder drained with a red Robinson catheter. A Graves speculum was inserted in the vagina and the anterior lip of the cervix was grasped with a single-tooth tenaculum. Paracervical block was performed with a total of 16 cc of 2% plain lidocaine. The cervix was stenotic even initially to the os finder. I was eventually able to find the cervical canal which was to the right of the midline.The cervix was gradually dilated to a size 19 dilator without difficulty. The observer hysteroscope was inserted and good visualization was achieved eventually, initially I was only able to see the and the endocervical canal. She had 2 possible small endometrial polyps. The hysteroscope was removed. Sharp curettage was performed which revealed minimal endometrial tissue. Hysteroscopy was again performed and revealed still 2 small polyps.  These were then removed with polyp forceps. The hysteroscope was again removed and curettage was performed return remove this tissue. Hysteroscopy confirmed removal of the vast majority of endometrial tissue. The hysteroscope was again removed. The single-tooth  tenaculum was removed from the cervix and bleeding was controlled with pressure. All instruments were then removed from the vagina. The patient tolerated the procedure well and was taken to the recovery in stable condition. Counts were correct and she had PAS hose on throughout the procedure.

## 2013-03-25 NOTE — H&P (Signed)
[name] is an 47 y.o. female. She was seen a few weeks ago as a new patient.  She had not had a menses for 18-24 months, then about a month ago had normal menstrual like bleeding.  Pelvic exam was normal, cervix was stenotic preventing endometrial biopsy.  Subsequent pelvic ultrasound has revealed a 10 mm hyperechoic endometrium with the question of a polyp, FSH was 12.  She is admitted for hysteroscopy D&C for endometrial sampling.  Pertinent Gynecological History: OB History: G0, P0   Menstrual History: No LMP recorded. Patient is not currently having periods (Reason: Perimenopausal).    Past Medical History  Diagnosis Date  . Sleep apnea     noncompliant w/ CPAP  . Hypertension   . Hyperlipidemia   . Chronic headache   . Fibromyalgia     daily narcotics  . Anxiety     hx chronic BZ use, stopped 07/2010  . Anemia   . Pulmonary sarcoidosis     unimpressive CT chest 2011  . Colonic polyp   . GERD (gastroesophageal reflux disease)   . ALLERGIC RHINITIS   . Asthma   . CHF (congestive heart failure)     Diastolic with fluid overload, May, 2012, LVEF 60%  . Morbid obesity   . Depression   . Panic attacks   . Diabetes mellitus   . COPD (chronic obstructive pulmonary disease)     on home O2, moderate airflow obstruction, suspect d/t emphysema  . Obesity   . Elevated LFTs 09/2011  . Ovarian cyst   . Chronic back pain   . Chest tightness     June, 2014  . Palpitation     June, 2014  . Shortness of breath     esp on exertion    Past Surgical History  Procedure Laterality Date  . Polypectomy  2011  . Lumbar microdiscectomy  07/06/2011    R L4-5, stern  . Back surgery    . Carpal tunnel release    . Steroid spinal injections      Family History  Problem Relation Age of Onset  . Hypertension Mother   . Emphysema Father   . Hypertension Father   . Stomach cancer Father   . Allergies Brother   . Hypertension Brother   . Stomach cancer Brother   . Heart  disease Mother   . Heart disease Father   . Heart disease Brother     Social History:  reports that she has been smoking Cigarettes.  She has a 7.25 pack-year smoking history. She has never used smokeless tobacco. She reports that  drinks alcohol. She reports that she does not use illicit drugs.  Allergies:  Allergies  Allergen Reactions  . Ciprofloxacin     REACTION: nausea  . Latex     REACTION: rash, burning  . Sulfamethoxazole W-Trimethoprim     Bactrim REACTION: Projectile Vomiting  . Azithromycin     REACTION: resistant  . Doxycycline     REACTION: restistant  . Metformin And Related Other (See Comments)    Severe diarrhea  . Metronidazole Nausea And Vomiting    Prescriptions prior to admission  Medication Sig Dispense Refill  . albuterol (PROVENTIL HFA;VENTOLIN HFA) 108 (90 BASE) MCG/ACT inhaler Inhale 2 puffs into the lungs every 6 (six) hours as needed. For wheeze or shortness of breath  1 Inhaler  0  . albuterol (PROVENTIL) (2.5 MG/3ML) 0.083% nebulizer solution USE 1 VIAL VIA NEBULIZER EVERY 6 HOURS AS NEEDED FOR  WHEEZE OR SHORTNESS OF BREATH  75 mL  4  . ALPRAZolam (XANAX) 1 MG tablet TAKE 1 TABLET BY MOUTH TWICE DAILY AS NEEDED  60 tablet  5  . budesonide-formoterol (SYMBICORT) 160-4.5 MCG/ACT inhaler Inhale 2 puffs into the lungs 2 (two) times daily.  1 Inhaler  2  . cetirizine (ZYRTEC) 1 MG/ML syrup Take 2 teaspoonful by mouth at bedtime  300 mL  0  . cetirizine (ZYRTEC) 10 MG tablet Take 10 mg by mouth daily.      Marland Kitchen esomeprazole (NEXIUM) 40 MG capsule Take 1 capsule (40 mg total) by mouth 2 (two) times daily.  60 capsule  5  . glimepiride (AMARYL) 1 MG tablet Take 1 mg by mouth daily before breakfast.      . insulin glargine (LANTUS) 100 UNIT/ML injection Inject 16 Units into the skin at bedtime.       . mometasone (NASONEX) 50 MCG/ACT nasal spray Place 2 sprays into the nose daily.      . nicotine (NICODERM CQ - DOSED IN MG/24 HOURS) 14 mg/24hr patch Place 1  patch onto the skin daily.  28 patch  0  . Oxycodone HCl 10 MG TABS Take 0.5-1 tablets (5-10 mg total) by mouth 3 (three) times daily as needed.  90 tablet  0  . potassium chloride SA (K-DUR,KLOR-CON) 20 MEQ tablet Take 2 tablets (40 mEq total) by mouth 3 (three) times daily.  180 tablet  3  . predniSONE (STERAPRED UNI-PAK) 10 MG tablet Take 1 tablet (10 mg total) by mouth as directed. As directed x 6 days  21 tablet  0  . sertraline (ZOLOFT) 25 MG tablet Take 25 mg by mouth daily.      Marland Kitchen spironolactone (ALDACTONE) 25 MG tablet Take 25 mg by mouth 2 (two) times daily.      Marland Kitchen tiotropium (SPIRIVA) 18 MCG inhalation capsule Place 18 mcg into inhaler and inhale daily.      Marland Kitchen torsemide (DEMADEX) 100 MG tablet Take 1 tablet (100 mg total) by mouth daily.  30 tablet  3  . ipratropium (ATROVENT) 0.02 % nebulizer solution Take 2.5 mLs (0.5 mg total) by nebulization every 6 (six) hours.  75 mL  0    Review of Systems  Respiratory: Positive for shortness of breath.   Cardiovascular: Negative.   Gastrointestinal: Negative.   Genitourinary: Negative.     Blood pressure 134/88, pulse 80, temperature 98.2 F (36.8 C), temperature source Oral, resp. rate 20, height 5' 8.5" (1.74 m), weight 131.543 kg (290 lb), SpO2 94.00%. Physical Exam  Constitutional: She appears well-developed and well-nourished.  Neck: Neck supple. No thyromegaly present.  Cardiovascular: Normal rate, regular rhythm and normal heart sounds.   No murmur heard. Respiratory: Effort normal and breath sounds normal.  GI: Soft. She exhibits no distension and no mass. There is no tenderness.  Genitourinary: Vagina normal.  Uterus normal size No adnexal mass    Results for orders placed during the hospital encounter of 03/25/13 (from the past 24 hour(s))  PREGNANCY, URINE     Status: None   Collection Time    03/25/13 10:30 AM      Result Value Range   Preg Test, Ur NEGATIVE  NEGATIVE  CBC     Status: Abnormal   Collection Time     03/25/13 10:33 AM      Result Value Range   WBC 9.7  4.0 - 10.5 K/uL   RBC 5.55 (*) 3.87 - 5.11 MIL/uL  Hemoglobin 14.8  12.0 - 15.0 g/dL   HCT 16.1  09.6 - 04.5 %   MCV 80.7  78.0 - 100.0 fL   MCH 26.7  26.0 - 34.0 pg   MCHC 33.0  30.0 - 36.0 g/dL   RDW 40.9 (*) 81.1 - 91.4 %   Platelets 239  150 - 400 K/uL  COMPREHENSIVE METABOLIC PANEL     Status: Abnormal   Collection Time    03/25/13 10:33 AM      Result Value Range   Sodium 134 (*) 135 - 145 mEq/L   Potassium 3.7  3.5 - 5.1 mEq/L   Chloride 94 (*) 96 - 112 mEq/L   CO2 31  19 - 32 mEq/L   Glucose, Bld 111 (*) 70 - 99 mg/dL   BUN 14  6 - 23 mg/dL   Creatinine, Ser 7.82  0.50 - 1.10 mg/dL   Calcium 9.8  8.4 - 95.6 mg/dL   Total Protein 8.1  6.0 - 8.3 g/dL   Albumin 3.8  3.5 - 5.2 g/dL   AST 17  0 - 37 U/L   ALT 20  0 - 35 U/L   Alkaline Phosphatase 100  39 - 117 U/L   Total Bilirubin 0.8  0.3 - 1.2 mg/dL   GFR calc non Af Amer 77 (*) >90 mL/min   GFR calc Af Amer 89 (*) >90 mL/min  GLUCOSE, CAPILLARY     Status: Abnormal   Collection Time    03/25/13 10:57 AM      Result Value Range   Glucose-Capillary 121 (*) 70 - 99 mg/dL   Comment 1 Documented in Chart     Comment 2 Notify RN      No results found.  Assessment/Plan: Abnormal bleeding, possibly just perimenopausal since [employer] is normal.  However, endometrium appears unusual by ultrasound and cervix is stenotic.  Hysteroscopy D&C procedure, risks, alternatives have all been discussed, will proceed for endometrial sampling.  Creedence Heiss D 03/25/2013, 11:48 AM

## 2013-03-26 ENCOUNTER — Encounter (HOSPITAL_COMMUNITY): Payer: Self-pay | Admitting: Obstetrics and Gynecology

## 2013-03-26 NOTE — Progress Notes (Signed)
Patient called re: headache and backache. She says she first had this HA late last night before she went to bed, and it persisted through the night without remission. She notices nothing that seems to make it better, and I did not prompt her with clues of positional components of relief.  The HA location is in the temples and some vague radiation to the back of the neck. No other related sx or complaints. Other likely unrelated complaints include mid-thoracic back pain when she coughs. I told her i think she could have a spinal HA, and that the RX is a EBP; however, in her this would be technically challenging and not to be undertaken lightly. I reassured her that the HA would virtually always resolve but that the time frame was variable, but that no harm would come to her. She want to wait it out.

## 2013-03-28 ENCOUNTER — Other Ambulatory Visit: Payer: Self-pay | Admitting: Internal Medicine

## 2013-03-31 ENCOUNTER — Emergency Department (HOSPITAL_COMMUNITY)
Admission: EM | Admit: 2013-03-31 | Discharge: 2013-03-31 | Disposition: A | Discharge status: Home / Self Care | Payer: PRIVATE HEALTH INSURANCE | Attending: Emergency Medicine | Admitting: Emergency Medicine

## 2013-03-31 ENCOUNTER — Encounter (HOSPITAL_COMMUNITY): Payer: Self-pay

## 2013-03-31 DIAGNOSIS — F17200 Nicotine dependence, unspecified, uncomplicated: Secondary | ICD-10-CM | POA: Insufficient documentation

## 2013-03-31 DIAGNOSIS — Z91040 Latex allergy status: Secondary | ICD-10-CM | POA: Insufficient documentation

## 2013-03-31 DIAGNOSIS — R112 Nausea with vomiting, unspecified: Secondary | ICD-10-CM | POA: Insufficient documentation

## 2013-03-31 DIAGNOSIS — E119 Type 2 diabetes mellitus without complications: Secondary | ICD-10-CM | POA: Insufficient documentation

## 2013-03-31 DIAGNOSIS — Z862 Personal history of diseases of the blood and blood-forming organs and certain disorders involving the immune mechanism: Secondary | ICD-10-CM | POA: Insufficient documentation

## 2013-03-31 DIAGNOSIS — Z8639 Personal history of other endocrine, nutritional and metabolic disease: Secondary | ICD-10-CM | POA: Insufficient documentation

## 2013-03-31 DIAGNOSIS — Z8742 Personal history of other diseases of the female genital tract: Secondary | ICD-10-CM | POA: Insufficient documentation

## 2013-03-31 DIAGNOSIS — F411 Generalized anxiety disorder: Secondary | ICD-10-CM | POA: Insufficient documentation

## 2013-03-31 DIAGNOSIS — J449 Chronic obstructive pulmonary disease, unspecified: Secondary | ICD-10-CM | POA: Insufficient documentation

## 2013-03-31 DIAGNOSIS — I5030 Unspecified diastolic (congestive) heart failure: Secondary | ICD-10-CM | POA: Insufficient documentation

## 2013-03-31 DIAGNOSIS — Z8709 Personal history of other diseases of the respiratory system: Secondary | ICD-10-CM | POA: Insufficient documentation

## 2013-03-31 DIAGNOSIS — F329 Major depressive disorder, single episode, unspecified: Secondary | ICD-10-CM | POA: Insufficient documentation

## 2013-03-31 DIAGNOSIS — J4489 Other specified chronic obstructive pulmonary disease: Secondary | ICD-10-CM | POA: Insufficient documentation

## 2013-03-31 DIAGNOSIS — Z8739 Personal history of other diseases of the musculoskeletal system and connective tissue: Secondary | ICD-10-CM | POA: Insufficient documentation

## 2013-03-31 DIAGNOSIS — Z8619 Personal history of other infectious and parasitic diseases: Secondary | ICD-10-CM | POA: Insufficient documentation

## 2013-03-31 DIAGNOSIS — Z860100 Personal history of colon polyps, unspecified: Secondary | ICD-10-CM | POA: Insufficient documentation

## 2013-03-31 DIAGNOSIS — Z79899 Other long term (current) drug therapy: Secondary | ICD-10-CM | POA: Insufficient documentation

## 2013-03-31 DIAGNOSIS — G971 Other reaction to spinal and lumbar puncture: Secondary | ICD-10-CM | POA: Insufficient documentation

## 2013-03-31 DIAGNOSIS — Z3202 Encounter for pregnancy test, result negative: Secondary | ICD-10-CM | POA: Insufficient documentation

## 2013-03-31 DIAGNOSIS — F3289 Other specified depressive episodes: Secondary | ICD-10-CM | POA: Insufficient documentation

## 2013-03-31 DIAGNOSIS — G4730 Sleep apnea, unspecified: Secondary | ICD-10-CM | POA: Insufficient documentation

## 2013-03-31 DIAGNOSIS — G8929 Other chronic pain: Secondary | ICD-10-CM | POA: Insufficient documentation

## 2013-03-31 DIAGNOSIS — K219 Gastro-esophageal reflux disease without esophagitis: Secondary | ICD-10-CM | POA: Insufficient documentation

## 2013-03-31 DIAGNOSIS — I10 Essential (primary) hypertension: Secondary | ICD-10-CM | POA: Insufficient documentation

## 2013-03-31 LAB — CBC WITH DIFFERENTIAL/PLATELET
Basophils Absolute: 0.1 10*3/uL (ref 0.0–0.1)
Basophils Relative: 1 % (ref 0–1)
HCT: 42.5 % (ref 36.0–46.0)
MCHC: 32.7 g/dL (ref 30.0–36.0)
Monocytes Absolute: 0.7 10*3/uL (ref 0.1–1.0)
Neutro Abs: 6 10*3/uL (ref 1.7–7.7)
RDW: 16.9 % — ABNORMAL HIGH (ref 11.5–15.5)

## 2013-03-31 LAB — BASIC METABOLIC PANEL
Calcium: 9.3 mg/dL (ref 8.4–10.5)
Chloride: 94 mEq/L — ABNORMAL LOW (ref 96–112)
Creatinine, Ser: 0.93 mg/dL (ref 0.50–1.10)
GFR calc Af Amer: 84 mL/min — ABNORMAL LOW (ref >90)

## 2013-03-31 LAB — URINE MICROSCOPIC-ADD ON

## 2013-03-31 LAB — POCT PREGNANCY, URINE: Preg Test, Ur: NEGATIVE

## 2013-03-31 LAB — URINALYSIS, ROUTINE W REFLEX MICROSCOPIC
Ketones, ur: NEGATIVE mg/dL
Leukocytes, UA: NEGATIVE
Nitrite: NEGATIVE
Protein, ur: NEGATIVE mg/dL
Urobilinogen, UA: 1 mg/dL (ref 0.0–1.0)
pH: 5.5 (ref 5.0–8.0)

## 2013-03-31 MED ORDER — SODIUM CHLORIDE 0.9 % IV SOLN
INTRAVENOUS | Status: DC
  Administered 2013-03-31: 20:00:00 via INTRAVENOUS

## 2013-03-31 MED ORDER — METOCLOPRAMIDE HCL 5 MG/ML IJ SOLN
10.0000 mg | Freq: Once | INTRAMUSCULAR | Status: AC
  Administered 2013-03-31: 10 mg via INTRAVENOUS
  Filled 2013-03-31: qty 2

## 2013-03-31 MED ORDER — KETOROLAC TROMETHAMINE 30 MG/ML IJ SOLN
30.0000 mg | Freq: Once | INTRAMUSCULAR | Status: AC
  Administered 2013-03-31: 30 mg via INTRAVENOUS
  Filled 2013-03-31: qty 1

## 2013-03-31 MED ORDER — DIPHENHYDRAMINE HCL 50 MG/ML IJ SOLN
25.0000 mg | Freq: Once | INTRAMUSCULAR | Status: AC
  Administered 2013-03-31: 25 mg via INTRAVENOUS
  Filled 2013-03-31: qty 1

## 2013-03-31 NOTE — ED Provider Notes (Signed)
Medical screening examination/treatment/procedure(s) were performed by non-physician practitioner and as supervising physician I was immediately available for consultation/collaboration.  Shelda Jakes, MD 03/31/13 2206

## 2013-03-31 NOTE — ED Provider Notes (Signed)
Patient is being treated conservatively, status post LP with recurrent headache.  She has received IV fluids, and Reglan, Benadryl, and Toradol, IV.  Patient states, that her headache has, resolved.  She's been instructed that if she again develops headache.  Followup with her primary care physician for better pain management  Arman Filter, NP 03/31/13 2146  Arman Filter, NP 03/31/13 2146

## 2013-03-31 NOTE — ED Provider Notes (Signed)
History    CSN: [id number] Arrival date & time 03/31/13  1739  First MD Initiated Contact with Patient 03/31/13 1754     Chief Complaint  Patient presents with  . Headache  . Nausea  . Emesis   (Consider location/radiation/quality/duration/timing/severity/associated sxs/prior Treatment) HPI Comments: Patient presents to the emergency department with chief complaint of headache. She states that she had a D&C done approximately one week ago. At that time she had an epidural. She states that she's been having a headache since the day following the procedure. The headache is worse when she stands. It is better lying down. It is associated with nausea and vomiting. She has not tried taking anything to alleviate her symptoms.   The history is provided by the patient. No language interpreter was used.   Past Medical History  Diagnosis Date  . Sleep apnea     noncompliant w/ CPAP  . Hypertension   . Hyperlipidemia   . Chronic headache   . Fibromyalgia     daily narcotics  . Anxiety     hx chronic BZ use, stopped 07/2010  . Anemia   . Pulmonary sarcoidosis     unimpressive CT chest 2011  . Colonic polyp   . GERD (gastroesophageal reflux disease)   . ALLERGIC RHINITIS   . Asthma   . CHF (congestive heart failure)     Diastolic with fluid overload, May, 2012, LVEF 60%  . Morbid obesity   . Depression   . Panic attacks   . Diabetes mellitus   . COPD (chronic obstructive pulmonary disease)     on home O2, moderate airflow obstruction, suspect d/t emphysema  . Obesity   . Elevated LFTs 09/2011  . Ovarian cyst   . Chronic back pain   . Chest tightness     June, 2014  . Palpitation     June, 2014  . Shortness of breath     esp on exertion   Past Surgical History  Procedure Laterality Date  . Polypectomy  2011  . Lumbar microdiscectomy  07/06/2011    R L4-5, stern  . Back surgery    . Carpal tunnel release    . Steroid spinal injections    . Hysteroscopy w/d&c N/A  03/25/2013    Procedure: DILATATION AND CURETTAGE /HYSTEROSCOPY;  Surgeon: Lavina Hamman, MD;  Location: [facility];  Service: Gynecology;  Laterality: N/A;  . Dilation and curettage of uterus     Family History  Problem Relation Age of Onset  . Hypertension Mother   . Emphysema Father   . Hypertension Father   . Stomach cancer Father   . Allergies Brother   . Hypertension Brother   . Stomach cancer Brother   . Heart disease Mother   . Heart disease Father   . Heart disease Brother    History  Substance Use Topics  . Smoking status: Light Tobacco Smoker -- 0.25 packs/day for 29 years    Types: Cigarettes    Last Attempt to Quit: 01/20/2013  . Smokeless tobacco: Never Used     Comment: Pt states she quit smoking 2-3 weeks ago. Resumed smoking September 2013 --2-3 CIGS DAILY AND E-CIG  . Alcohol Use: Yes     Comment: occasionally   OB History   Grav Para Term Preterm Abortions TAB SAB Ect Mult Living                 Review of Systems  All other systems reviewed and are  negative.    Allergies  Ciprofloxacin; Latex; Sulfamethoxazole w-trimethoprim; Azithromycin; Doxycycline; Metformin and related; and Metronidazole  Home Medications   Current Outpatient Rx  Name  Route  Sig  Dispense  Refill  . albuterol (PROVENTIL HFA;VENTOLIN HFA) 108 (90 BASE) MCG/ACT inhaler   Inhalation   Inhale 2 puffs into the lungs every 6 (six) hours as needed. For wheeze or shortness of breath   1 Inhaler   0   . albuterol (PROVENTIL) (2.5 MG/3ML) 0.083% nebulizer solution      USE 1 VIAL VIA NEBULIZER EVERY 6 HOURS AS NEEDED FOR WHEEZE OR SHORTNESS OF BREATH   75 mL   4   . ALPRAZolam (XANAX) 1 MG tablet      TAKE 1 TABLET BY MOUTH TWICE DAILY AS NEEDED   60 tablet   5   . budesonide-formoterol (SYMBICORT) 160-4.5 MCG/ACT inhaler   Inhalation   Inhale 2 puffs into the lungs 2 (two) times daily.   1 Inhaler   2   . cetirizine (ZYRTEC) 1 MG/ML syrup      TAKE 2 TEASPOONSFUL BY  MOUTH EVERY NIGHT AT BEDTIME   300 mL   0   . esomeprazole (NEXIUM) 40 MG capsule   Oral   Take 1 capsule (40 mg total) by mouth 2 (two) times daily.   60 capsule   5   . glimepiride (AMARYL) 1 MG tablet   Oral   Take 1 mg by mouth daily before breakfast.         . ibuprofen (ADVIL,MOTRIN) 200 MG tablet   Oral   Take 400 mg by mouth every 6 (six) hours as needed for pain.         Marland Kitchen ipratropium (ATROVENT) 0.02 % nebulizer solution   Nebulization   Take 2.5 mLs (0.5 mg total) by nebulization every 6 (six) hours.   75 mL   0   . LANTUS SOLOSTAR 100 UNIT/ML SOPN      INJECT 16 UNITS INTO THE SKIN AT BEDTIME   15 mL   0   . mometasone (NASONEX) 50 MCG/ACT nasal spray   Nasal   Place 2 sprays into the nose daily.         . Oxycodone HCl 10 MG TABS   Oral   Take 0.5-1 tablets (5-10 mg total) by mouth 3 (three) times daily as needed.   90 tablet   0     Fill on or after 03/15/13   . potassium chloride SA (K-DUR,KLOR-CON) 20 MEQ tablet   Oral   Take 2 tablets (40 mEq total) by mouth 3 (three) times daily.   180 tablet   3   . sertraline (ZOLOFT) 25 MG tablet   Oral   Take 25 mg by mouth daily.         Marland Kitchen spironolactone (ALDACTONE) 25 MG tablet   Oral   Take 25 mg by mouth 2 (two) times daily.         Marland Kitchen tiotropium (SPIRIVA) 18 MCG inhalation capsule   Inhalation   Place 18 mcg into inhaler and inhale daily.         Marland Kitchen torsemide (DEMADEX) 100 MG tablet   Oral   Take 1 tablet (100 mg total) by mouth daily.   30 tablet   3   . traMADol (ULTRAM) 50 MG tablet   Oral   Take 50 mg by mouth every 6 (six) hours as needed for pain.  BP 131/74  Pulse 84  Temp(Src) 98.3 F (36.8 C) (Oral)  Resp 24  Ht 5' 8.5" (1.74 m)  Wt 295 lb (133.811 kg)  BMI 44.2 kg/m2  SpO2 96% Physical Exam  Nursing note and vitals reviewed. Constitutional: She is oriented to person, place, and time. She appears well-developed and well-nourished.  HENT:  Head:  Normocephalic and atraumatic.  Right Ear: External ear normal.  Left Ear: External ear normal.  Non-tender over temporal artery, no increased pain with chewing.  Eyes: Conjunctivae and EOM are normal. Pupils are equal, round, and reactive to light.  No papilledema  Neck: Normal range of motion. Neck supple.  No pain with neck flexion, no meningismus  Cardiovascular: Normal rate, regular rhythm and normal heart sounds.  Exam reveals no gallop and no friction rub.   No murmur heard. Pulmonary/Chest: Effort normal and breath sounds normal. No respiratory distress. She has no wheezes. She has no rales. She exhibits no tenderness.  Abdominal: Soft. Bowel sounds are normal. She exhibits no distension and no mass. There is no tenderness. There is no rebound and no guarding.  Musculoskeletal: Normal range of motion. She exhibits no edema and no tenderness.  Normal gait.  Neurological: She is alert and oriented to person, place, and time. She has normal reflexes.  CN 3-12 intact, no pronator drift, normal shin to heel, normal RAM, sensation and strength intact bilaterally.  Skin: Skin is warm and dry.  Psychiatric: She has a normal mood and affect. Her behavior is normal. Judgment and thought content normal.    ED Course  Procedures (including critical care time) Labs Reviewed  CBC WITH DIFFERENTIAL  BASIC METABOLIC PANEL  URINALYSIS, ROUTINE W REFLEX MICROSCOPIC   No results found. No diagnosis found.  MDM  Patient with probable spinal headache, following epidural one week ago.  Will give fluids and migraine cocktail.    Patient signed out to Earley Favor, NP, who will continue care.  Plan is to re-evaluate in 30 minutes.  Discharge to home.  Consider IR follow-up for blood patch.  Roxy Horseman, PA-C 03/31/13 2010

## 2013-03-31 NOTE — ED Notes (Addendum)
Pt presents by EMS with c/o headache. Pt had a spinal tap on 7/1 and has had headache since the spinal tap. Pt says the doctor told her that there was a procedure they could do to fix the headache but they did not want to do that because of her weight and that it would probably fix itself. Pt is O2 dependent at home. Pt also says she has had nausea and vomiting since the spinal tap.

## 2013-03-31 NOTE — ED Provider Notes (Signed)
Medical screening examination/treatment/procedure(s) were performed by non-physician practitioner and as supervising physician I was immediately available for consultation/collaboration.  Shelda Jakes, MD 03/31/13 2017

## 2013-03-31 NOTE — ED Notes (Signed)
MD at bedside. 

## 2013-04-07 ENCOUNTER — Encounter: Payer: Self-pay | Admitting: Cardiology

## 2013-04-07 ENCOUNTER — Telehealth: Payer: Self-pay | Admitting: Cardiology

## 2013-04-07 ENCOUNTER — Encounter: Payer: Self-pay | Admitting: Internal Medicine

## 2013-04-07 NOTE — Telephone Encounter (Signed)
Darl Pikes from Dr. Rochele Pages orthopedic MD would like to know if pt can be off aspirin for 3 to 5 days prior epidural injection. Pt will be scheduled for the procedure after Dr. Myrtis Ser clears pt to be off Asprin.

## 2013-04-07 NOTE — Telephone Encounter (Signed)
New problem   Susan/Dr Alveda Reasons calling in reference to anticoag form for pt to stop Asprin 3-5 day before epidural injection. Please call or fax to [phone] Attn Darl Pikes

## 2013-04-08 ENCOUNTER — Ambulatory Visit (INDEPENDENT_AMBULATORY_CARE_PROVIDER_SITE_OTHER): Payer: PRIVATE HEALTH INSURANCE | Admitting: Internal Medicine

## 2013-04-08 ENCOUNTER — Encounter: Payer: Self-pay | Admitting: Internal Medicine

## 2013-04-08 VITALS — BP 110/72 | HR 87 | Temp 97.8°F | Wt 300.0 lb

## 2013-04-08 DIAGNOSIS — F32A Depression, unspecified: Secondary | ICD-10-CM

## 2013-04-08 DIAGNOSIS — M5430 Sciatica, unspecified side: Secondary | ICD-10-CM

## 2013-04-08 DIAGNOSIS — IMO0002 Reserved for concepts with insufficient information to code with codable children: Secondary | ICD-10-CM

## 2013-04-08 DIAGNOSIS — J9620 Acute and chronic respiratory failure, unspecified whether with hypoxia or hypercapnia: Secondary | ICD-10-CM

## 2013-04-08 DIAGNOSIS — E1165 Type 2 diabetes mellitus with hyperglycemia: Secondary | ICD-10-CM

## 2013-04-08 DIAGNOSIS — M5431 Sciatica, right side: Secondary | ICD-10-CM

## 2013-04-08 DIAGNOSIS — F329 Major depressive disorder, single episode, unspecified: Secondary | ICD-10-CM

## 2013-04-08 DIAGNOSIS — I5033 Acute on chronic diastolic (congestive) heart failure: Secondary | ICD-10-CM

## 2013-04-08 MED ORDER — OXYCODONE HCL 10 MG PO TABS: 5.0000 mg | ORAL_TABLET | Freq: Three times a day (TID) | ORAL | Status: DC | PRN

## 2013-04-08 MED ORDER — SERTRALINE HCL 50 MG PO TABS: 50.0000 mg | ORAL_TABLET | Freq: Every day | ORAL | Status: DC

## 2013-04-08 NOTE — Assessment & Plan Note (Signed)
ER visit for RLE numbness and pain  01/2011 and again 06/2011> MRI with free fragment and encroachment on L4 root -  status post R L4-5 microdiscectomy 07/06/2011 > symptoms improved but not resolved Repeat neurosurgical evaluation 2013 and February 2014, patient declined repeat surgical intervention at that time Now seeing ortho spie - tooke - recommended ESI - encouraged to proceed with same continue oxy as needed - refills today- reviewed chronic pain mgmt as ongoing

## 2013-04-08 NOTE — Assessment & Plan Note (Signed)
Acute exacerbation CHF - see below Chronic hypoxic resp failure with COPD -no flare  On bipap at home - baseline hypercarbia reviewed Resumed chronic inhalers with when necessary nebulizer, continue home oxygen and followup with pulmonary specialist as planned

## 2013-04-08 NOTE — Patient Instructions (Signed)
It was good to see you today. We have reviewed your prior records including labs and tests today Medications reviewed and updated, increase Demadex to 100 mg twice daily for next 2 weeks and increase sertraline to 50 mg daily -no other changes recommended at this time. Return next week for lab only to check potassium and kidney function, you'll be called with these results Refill on pain medications provided today Followup in 6 weeks to review depression, weight, fluid, breathing and other medical issues; call sooner if problems

## 2013-04-08 NOTE — Telephone Encounter (Signed)
Per Dr Myrtis Ser the pt is okay to hold ASA 3-5 days before injection. Anticoagulant Clearance letter from Dr Toni Arthurs office completed by Dr Myrtis Ser today and will be faxed to [phone], Attn: Darl Pikes.

## 2013-04-08 NOTE — Assessment & Plan Note (Signed)
freq exacerbations - acute weight gain reviewed in past 3 weeks  treat with temporary increase diuretic - 100mg  demadex bid x 2 weeks Check Bmet in 1 week Continue monitor sodium intake and monitor weights at home - call if increase weight >3#/day Encouragement provided on continued weight loss efforts

## 2013-04-08 NOTE — Addendum Note (Signed)
Addended by: Rene Paci A on: 04/08/2013 02:58 PM   Modules accepted: Orders

## 2013-04-08 NOTE — Progress Notes (Signed)
Subjective:    [name], female    DOB: August 21, 1966, 47 y.o.   MRN: [id number]  HPI Here for ER follow up - headache, neg eval reviewed - headache symptoms have resolved  Since discharge home, has developed increasing edema and dyspnea with min exertion no fever or sputum Taking diuretic only qd, inconsistently, due to "undesirable" (inconvient) urination side effects   Also considering spine injections rather than fusion to help leg nd back pain as advised by new spine specialist eval (tooke)  Past Medical History  Diagnosis Date  . Sleep apnea     noncompliant w/ CPAP  . Hypertension   . Hyperlipidemia   . Chronic headache   . Fibromyalgia     daily narcotics  . Anxiety     hx chronic BZ use, stopped 07/2010  . Anemia   . Pulmonary sarcoidosis     unimpressive CT chest 2011  . Colonic polyp   . GERD (gastroesophageal reflux disease)   . ALLERGIC RHINITIS   . Asthma   . CHF (congestive heart failure)     Diastolic with fluid overload, May, 2012, LVEF 60%  . Morbid obesity   . Depression   . Panic attacks   . Diabetes mellitus   . COPD (chronic obstructive pulmonary disease)     on home O2, moderate airflow obstruction, suspect d/t emphysema  . Obesity   . Elevated LFTs 09/2011  . Ovarian cyst   . Chronic back pain     Past Surgical History  Procedure Laterality Date  . Polypectomy  2011  . Lumbar microdiscectomy  07/06/2011    R L4-5, stern  . Back surgery    . Carpal tunnel release    . Steroid spinal injections    . Hysteroscopy w/d&c N/A 03/25/2013    Procedure: DILATATION AND CURETTAGE /HYSTEROSCOPY;  Surgeon: Lavina Hamman, MD;  Location: [facility];  Service: Gynecology;  Laterality: N/A;  . Dilation and curettage of uterus       Review of Systems  Constitutional: Positive for unexpected weight change. Negative for fever and fatigue.  Respiratory: Positive for shortness of breath. Negative for cough and wheezing.   Cardiovascular:  Positive for leg swelling. Negative for chest pain and palpitations.  Neurological: Negative for light-headedness and headaches.  Psychiatric/Behavioral: Positive for dysphoric mood. Negative for suicidal ideas, sleep disturbance and self-injury. The patient is not nervous/anxious.        Objective:   Physical Exam  BP 110/72  Pulse 87  Temp(Src) 97.8 F (36.6 C) (Oral)  Wt 300 lb (136.079 kg)  BMI 44.95 kg/m2  SpO2 90% Wt Readings from Last 3 Encounters:  04/08/13 300 lb (136.079 kg)  03/31/13 295 lb (133.811 kg)  03/19/13 290 lb (131.543 kg)   Gen: obese, NAD. Wearing nasal cannula oxygen continuously Lungs: CTA B, decreased air mvt at bases CV: distant but RRR, trace BLE edema Abd: obese, soft, nontender, bowel sounds present Neuro: awake alert and oriented x4. No cranial nerve deficits. Moves all extremities well. Coordination, balance, gait are normal Psyc: dysphoric mood/affect   Lab Results  Component Value Date   WBC 8.7 03/31/2013   HGB 13.9 03/31/2013   HCT 42.5 03/31/2013   PLT 230 03/31/2013   GLUCOSE 82 03/31/2013   CHOL 255* 02/13/2012   TRIG 133.0 02/13/2012   HDL 54.30 02/13/2012   LDLDIRECT 186.4 02/13/2012   LDLCALC  Value: 92        Total Cholesterol/HDL:CHD Risk Coronary Heart  Disease Risk Table                     Men   Women  1/2 Average Risk   3.4   3.3  Average Risk       5.0   4.4  2 X Average Risk   9.6   7.1  3 X Average Risk  23.4   11.0        Use the calculated Patient Ratio above and the CHD Risk Table to determine the patient's CHD Risk.        ATP III CLASSIFICATION (LDL):  <100     mg/dL   Optimal  161-096  mg/dL   Near or Above                    Optimal  130-159  mg/dL   Borderline  045-409  mg/dL   High  >811     mg/dL   Very High 06/08/7828   ALT 20 03/25/2013   AST 17 03/25/2013   NA 132* 03/31/2013   K 3.6 03/31/2013   CL 94* 03/31/2013   CREATININE 0.93 03/31/2013   BUN 11 03/31/2013   CO2 26 03/31/2013   TSH 0.701 04/22/2012   HGBA1C 6.3* 01/07/2013        Assessment & Plan:   see problem list. Medications and labs reviewed today.

## 2013-04-08 NOTE — Assessment & Plan Note (Signed)
Increasing symptoms, exacerbated by acute and chronic medical illnesses Increase dose sertraline now verified no SI/HI

## 2013-04-08 NOTE — Assessment & Plan Note (Signed)
New dx, clarified 11/2011 - exacerbated by frequent pred use Intol of metformin due to severe diarrhea side effects  On Lantus pen qhs and Amaryl in AM - titrate as needed recheck a1c q 3 mo  Lab Results  Component Value Date   HGBA1C 6.3* 01/07/2013

## 2013-04-21 ENCOUNTER — Other Ambulatory Visit: Payer: Self-pay | Admitting: Internal Medicine

## 2013-05-06 ENCOUNTER — Ambulatory Visit: Payer: Medicare Other | Admitting: Pulmonary Disease

## 2013-05-06 ENCOUNTER — Other Ambulatory Visit: Payer: Self-pay | Admitting: Internal Medicine

## 2013-05-07 ENCOUNTER — Other Ambulatory Visit: Payer: Self-pay | Admitting: Internal Medicine

## 2013-05-07 MED ORDER — OXYCODONE HCL 10 MG PO TABS: 5.0000 mg | ORAL_TABLET | Freq: Three times a day (TID) | ORAL | Status: DC | PRN

## 2013-05-08 ENCOUNTER — Encounter (HOSPITAL_COMMUNITY): Payer: Self-pay | Admitting: Emergency Medicine

## 2013-05-08 ENCOUNTER — Emergency Department (HOSPITAL_COMMUNITY)
Admission: EM | Admit: 2013-05-08 | Discharge: 2013-05-14 | Disposition: A | Discharge status: Home / Self Care | Payer: Medicare Other | Attending: Emergency Medicine | Admitting: Emergency Medicine

## 2013-05-08 DIAGNOSIS — Z8639 Personal history of other endocrine, nutritional and metabolic disease: Secondary | ICD-10-CM | POA: Insufficient documentation

## 2013-05-08 DIAGNOSIS — Z8709 Personal history of other diseases of the respiratory system: Secondary | ICD-10-CM | POA: Insufficient documentation

## 2013-05-08 DIAGNOSIS — M549 Dorsalgia, unspecified: Secondary | ICD-10-CM | POA: Insufficient documentation

## 2013-05-08 DIAGNOSIS — Z794 Long term (current) use of insulin: Secondary | ICD-10-CM | POA: Insufficient documentation

## 2013-05-08 DIAGNOSIS — F17200 Nicotine dependence, unspecified, uncomplicated: Secondary | ICD-10-CM | POA: Insufficient documentation

## 2013-05-08 DIAGNOSIS — Z9889 Other specified postprocedural states: Secondary | ICD-10-CM | POA: Insufficient documentation

## 2013-05-08 DIAGNOSIS — F329 Major depressive disorder, single episode, unspecified: Secondary | ICD-10-CM | POA: Insufficient documentation

## 2013-05-08 DIAGNOSIS — Z3202 Encounter for pregnancy test, result negative: Secondary | ICD-10-CM | POA: Insufficient documentation

## 2013-05-08 DIAGNOSIS — I5030 Unspecified diastolic (congestive) heart failure: Secondary | ICD-10-CM | POA: Insufficient documentation

## 2013-05-08 DIAGNOSIS — F3289 Other specified depressive episodes: Secondary | ICD-10-CM | POA: Insufficient documentation

## 2013-05-08 DIAGNOSIS — K219 Gastro-esophageal reflux disease without esophagitis: Secondary | ICD-10-CM | POA: Insufficient documentation

## 2013-05-08 DIAGNOSIS — G8929 Other chronic pain: Secondary | ICD-10-CM | POA: Insufficient documentation

## 2013-05-08 DIAGNOSIS — I10 Essential (primary) hypertension: Secondary | ICD-10-CM | POA: Insufficient documentation

## 2013-05-08 DIAGNOSIS — R079 Chest pain, unspecified: Secondary | ICD-10-CM | POA: Insufficient documentation

## 2013-05-08 DIAGNOSIS — Z860100 Personal history of colon polyps, unspecified: Secondary | ICD-10-CM | POA: Insufficient documentation

## 2013-05-08 DIAGNOSIS — Z79899 Other long term (current) drug therapy: Secondary | ICD-10-CM | POA: Insufficient documentation

## 2013-05-08 DIAGNOSIS — IMO0001 Reserved for inherently not codable concepts without codable children: Secondary | ICD-10-CM | POA: Insufficient documentation

## 2013-05-08 DIAGNOSIS — F411 Generalized anxiety disorder: Secondary | ICD-10-CM | POA: Insufficient documentation

## 2013-05-08 DIAGNOSIS — F1410 Cocaine abuse, uncomplicated: Secondary | ICD-10-CM | POA: Insufficient documentation

## 2013-05-08 DIAGNOSIS — E119 Type 2 diabetes mellitus without complications: Secondary | ICD-10-CM | POA: Insufficient documentation

## 2013-05-08 DIAGNOSIS — J449 Chronic obstructive pulmonary disease, unspecified: Secondary | ICD-10-CM | POA: Insufficient documentation

## 2013-05-08 DIAGNOSIS — G4730 Sleep apnea, unspecified: Secondary | ICD-10-CM | POA: Insufficient documentation

## 2013-05-08 DIAGNOSIS — Z8679 Personal history of other diseases of the circulatory system: Secondary | ICD-10-CM | POA: Insufficient documentation

## 2013-05-08 DIAGNOSIS — Z8619 Personal history of other infectious and parasitic diseases: Secondary | ICD-10-CM | POA: Insufficient documentation

## 2013-05-08 DIAGNOSIS — Z862 Personal history of diseases of the blood and blood-forming organs and certain disorders involving the immune mechanism: Secondary | ICD-10-CM | POA: Insufficient documentation

## 2013-05-08 DIAGNOSIS — IMO0002 Reserved for concepts with insufficient information to code with codable children: Secondary | ICD-10-CM | POA: Insufficient documentation

## 2013-05-08 DIAGNOSIS — F1210 Cannabis abuse, uncomplicated: Secondary | ICD-10-CM | POA: Insufficient documentation

## 2013-05-08 DIAGNOSIS — Z8742 Personal history of other diseases of the female genital tract: Secondary | ICD-10-CM | POA: Insufficient documentation

## 2013-05-08 DIAGNOSIS — J4489 Other specified chronic obstructive pulmonary disease: Secondary | ICD-10-CM | POA: Insufficient documentation

## 2013-05-08 DIAGNOSIS — Z9981 Dependence on supplemental oxygen: Secondary | ICD-10-CM | POA: Insufficient documentation

## 2013-05-08 DIAGNOSIS — Z91040 Latex allergy status: Secondary | ICD-10-CM | POA: Insufficient documentation

## 2013-05-08 LAB — URINALYSIS, ROUTINE W REFLEX MICROSCOPIC
Glucose, UA: NEGATIVE mg/dL
Ketones, ur: NEGATIVE mg/dL
Leukocytes, UA: NEGATIVE
Nitrite: NEGATIVE
Protein, ur: NEGATIVE mg/dL
Urobilinogen, UA: 0.2 mg/dL (ref 0.0–1.0)

## 2013-05-08 LAB — COMPREHENSIVE METABOLIC PANEL
ALT: 29 U/L (ref 0–35)
Albumin: 3.2 g/dL — ABNORMAL LOW (ref 3.5–5.2)
Alkaline Phosphatase: 112 U/L (ref 39–117)
Chloride: 94 mEq/L — ABNORMAL LOW (ref 96–112)
Potassium: 4 mEq/L (ref 3.5–5.1)
Sodium: 133 mEq/L — ABNORMAL LOW (ref 135–145)
Total Bilirubin: 0.5 mg/dL (ref 0.3–1.2)
Total Protein: 7.6 g/dL (ref 6.0–8.3)

## 2013-05-08 LAB — CBC WITH DIFFERENTIAL/PLATELET

## 2013-05-08 LAB — RAPID URINE DRUG SCREEN, HOSP PERFORMED: Barbiturates: NOT DETECTED

## 2013-05-08 LAB — PREGNANCY, URINE: Preg Test, Ur: NEGATIVE

## 2013-05-08 MED ORDER — GLIMEPIRIDE 1 MG PO TABS
1.0000 mg | ORAL_TABLET | Freq: Every day | ORAL | Status: DC
  Administered 2013-05-09 – 2013-05-14 (×6): 1 mg via ORAL
  Filled 2013-05-08 (×7): qty 1

## 2013-05-08 MED ORDER — NICOTINE 21 MG/24HR TD PT24
21.0000 mg | MEDICATED_PATCH | Freq: Every day | TRANSDERMAL | Status: DC
  Administered 2013-05-09 – 2013-05-14 (×6): 21 mg via TRANSDERMAL
  Filled 2013-05-08 (×6): qty 1

## 2013-05-08 MED ORDER — LORAZEPAM 1 MG PO TABS
0.0000 mg | ORAL_TABLET | Freq: Two times a day (BID) | ORAL | Status: DC
  Administered 2013-05-12: 1 mg via ORAL
  Filled 2013-05-08 (×3): qty 1

## 2013-05-08 MED ORDER — SERTRALINE HCL 50 MG PO TABS
50.0000 mg | ORAL_TABLET | Freq: Every day | ORAL | Status: DC
  Administered 2013-05-09 – 2013-05-11 (×3): 50 mg via ORAL
  Filled 2013-05-08 (×3): qty 1

## 2013-05-08 MED ORDER — POTASSIUM CHLORIDE CRYS ER 20 MEQ PO TBCR: 40.0000 meq | EXTENDED_RELEASE_TABLET | Freq: Three times a day (TID) | ORAL | Status: DC

## 2013-05-08 MED ORDER — LORAZEPAM 1 MG PO TABS
0.0000 mg | ORAL_TABLET | Freq: Four times a day (QID) | ORAL | Status: AC
  Administered 2013-05-10 (×2): 1 mg via ORAL
  Filled 2013-05-08: qty 1

## 2013-05-08 MED ORDER — BUDESONIDE-FORMOTEROL FUMARATE 160-4.5 MCG/ACT IN AERO
2.0000 | INHALATION_SPRAY | Freq: Two times a day (BID) | RESPIRATORY_TRACT | Status: DC
  Administered 2013-05-09 – 2013-05-14 (×11): 2 via RESPIRATORY_TRACT
  Filled 2013-05-08 (×2): qty 6

## 2013-05-08 MED ORDER — IPRATROPIUM BROMIDE 0.02 % IN SOLN: 0.5000 mg | RESPIRATORY_TRACT | Status: DC | PRN

## 2013-05-08 MED ORDER — PANTOPRAZOLE SODIUM 40 MG PO TBEC
40.0000 mg | DELAYED_RELEASE_TABLET | Freq: Every day | ORAL | Status: DC
  Administered 2013-05-09 – 2013-05-14 (×6): 40 mg via ORAL
  Filled 2013-05-08 (×6): qty 1

## 2013-05-08 MED ORDER — ALPRAZOLAM 0.5 MG PO TABS
1.0000 mg | ORAL_TABLET | Freq: Two times a day (BID) | ORAL | Status: DC | PRN
  Administered 2013-05-09 – 2013-05-14 (×9): 1 mg via ORAL
  Filled 2013-05-08 (×8): qty 2
  Filled 2013-05-08: qty 1
  Filled 2013-05-08: qty 2

## 2013-05-08 MED ORDER — ALUM & MAG HYDROXIDE-SIMETH 200-200-20 MG/5ML PO SUSP: 30.0000 mL | ORAL | Status: DC | PRN

## 2013-05-08 MED ORDER — ACETAMINOPHEN 325 MG PO TABS
650.0000 mg | ORAL_TABLET | ORAL | Status: DC | PRN
  Administered 2013-05-09 – 2013-05-14 (×5): 650 mg via ORAL
  Filled 2013-05-08 (×5): qty 2

## 2013-05-08 MED ORDER — IBUPROFEN 200 MG PO TABS
400.0000 mg | ORAL_TABLET | Freq: Four times a day (QID) | ORAL | Status: DC | PRN
  Filled 2013-05-08 (×3): qty 2

## 2013-05-08 MED ORDER — POTASSIUM CHLORIDE CRYS ER 20 MEQ PO TBCR
40.0000 meq | EXTENDED_RELEASE_TABLET | Freq: Two times a day (BID) | ORAL | Status: DC
  Administered 2013-05-09 – 2013-05-14 (×12): 40 meq via ORAL
  Filled 2013-05-08 (×12): qty 2

## 2013-05-08 MED ORDER — ALBUTEROL SULFATE (5 MG/ML) 0.5% IN NEBU
2.5000 mg | INHALATION_SOLUTION | RESPIRATORY_TRACT | Status: DC | PRN
  Administered 2013-05-12: 2.5 mg via RESPIRATORY_TRACT
  Filled 2013-05-08: qty 0.5

## 2013-05-08 MED ORDER — INSULIN GLARGINE 100 UNIT/ML ~~LOC~~ SOLN
16.0000 [IU] | Freq: Every day | SUBCUTANEOUS | Status: DC
  Administered 2013-05-09 – 2013-05-13 (×6): 16 [IU] via SUBCUTANEOUS
  Filled 2013-05-08 (×10): qty 0.16

## 2013-05-08 MED ORDER — TIOTROPIUM BROMIDE MONOHYDRATE 18 MCG IN CAPS
18.0000 ug | ORAL_CAPSULE | Freq: Every day | RESPIRATORY_TRACT | Status: DC
  Administered 2013-05-09 – 2013-05-13 (×5): 18 ug via RESPIRATORY_TRACT
  Filled 2013-05-08 (×4): qty 5

## 2013-05-08 MED ORDER — OXYCODONE HCL 5 MG PO TABS
5.0000 mg | ORAL_TABLET | Freq: Three times a day (TID) | ORAL | Status: DC | PRN
  Administered 2013-05-09 – 2013-05-10 (×4): 10 mg via ORAL
  Administered 2013-05-11 (×2): 5 mg via ORAL
  Administered 2013-05-11 – 2013-05-13 (×4): 10 mg via ORAL
  Administered 2013-05-13 (×2): 5 mg via ORAL
  Administered 2013-05-14: 10 mg via ORAL
  Administered 2013-05-14 (×2): 5 mg via ORAL
  Filled 2013-05-08: qty 1
  Filled 2013-05-08 (×3): qty 2
  Filled 2013-05-08: qty 1
  Filled 2013-05-08 (×3): qty 2
  Filled 2013-05-08 (×2): qty 1
  Filled 2013-05-08: qty 2
  Filled 2013-05-08: qty 1
  Filled 2013-05-08: qty 2
  Filled 2013-05-08: qty 1
  Filled 2013-05-08 (×2): qty 2

## 2013-05-08 MED ORDER — THIAMINE HCL 100 MG/ML IJ SOLN: 100.0000 mg | Freq: Every day | INTRAMUSCULAR | Status: DC

## 2013-05-08 MED ORDER — SPIRONOLACTONE 25 MG PO TABS
25.0000 mg | ORAL_TABLET | Freq: Two times a day (BID) | ORAL | Status: DC
  Administered 2013-05-09 – 2013-05-14 (×12): 25 mg via ORAL
  Filled 2013-05-08 (×15): qty 1

## 2013-05-08 MED ORDER — TORSEMIDE 100 MG PO TABS
100.0000 mg | ORAL_TABLET | Freq: Two times a day (BID) | ORAL | Status: DC
  Administered 2013-05-09 – 2013-05-14 (×10): 100 mg via ORAL
  Filled 2013-05-08 (×15): qty 1

## 2013-05-08 MED ORDER — VITAMIN B-1 100 MG PO TABS
100.0000 mg | ORAL_TABLET | Freq: Every day | ORAL | Status: DC
  Administered 2013-05-09 – 2013-05-14 (×6): 100 mg via ORAL
  Filled 2013-05-08 (×6): qty 1

## 2013-05-08 NOTE — ED Provider Notes (Addendum)
CSN: [id number]     Arrival date & time 05/08/13  2053 History     First MD Initiated Contact with Patient 05/08/13 2305     Chief Complaint  Patient presents with  . Suicidal   (Consider location/radiation/quality/duration/timing/severity/associated sxs/prior Treatment) HPI This is a 46 year old female with a several month history of worsening depression and suicidal thoughts the became severe today. Her depression is partly fueled by the fact that her activities of daily living are limited by COPD, need for chronic supplemental oxygen and dyspnea on even minor exertion. She is also frustrated by having to take so many medications, including insulin injections, every day. She has been self-medicating with cigarettes, alcohol, cocaine and marijuana. She states she spent most of this morning using drugs and smoking cigarettes. She has chronic back pain as well as fibromyalgia. She has been sleeping more than usual and has had a decreased appetite. She had some chest pain earlier while she was smoking cigarettes but none now. She is having active suicidal thoughts with thoughts of slashing her wrist.   Past Medical History  Diagnosis Date  . Sleep apnea     noncompliant w/ CPAP  . Hypertension   . Hyperlipidemia   . Chronic headache   . Fibromyalgia     daily narcotics  . Anxiety     hx chronic BZ use, stopped 07/2010  . Anemia   . Pulmonary sarcoidosis     unimpressive CT chest 2011  . Colonic polyp   . GERD (gastroesophageal reflux disease)   . ALLERGIC RHINITIS   . Asthma   . CHF (congestive heart failure)     Diastolic with fluid overload, May, 2012, LVEF 60%  . Morbid obesity   . Depression   . Panic attacks   . Diabetes mellitus   . COPD (chronic obstructive pulmonary disease)     on home O2, moderate airflow obstruction, suspect d/t emphysema  . Obesity   . Elevated LFTs 09/2011  . Ovarian cyst   . Chronic back pain    Past Surgical History  Procedure Laterality  Date  . Polypectomy  2011  . Lumbar microdiscectomy  07/06/2011    R L4-5, stern  . Back surgery    . Carpal tunnel release    . Steroid spinal injections    . Hysteroscopy w/d&c N/A 03/25/2013    Procedure: DILATATION AND CURETTAGE /HYSTEROSCOPY;  Surgeon: Lavina Hamman, MD;  Location: [facility];  Service: Gynecology;  Laterality: N/A;  . Dilation and curettage of uterus     Family History  Problem Relation Age of Onset  . Hypertension Mother   . Emphysema Father   . Hypertension Father   . Stomach cancer Father   . Allergies Brother   . Hypertension Brother   . Stomach cancer Brother   . Heart disease Mother   . Heart disease Father   . Heart disease Brother    History  Substance Use Topics  . Smoking status: Light Tobacco Smoker -- 0.25 packs/day for 29 years    Types: Cigarettes    Last Attempt to Quit: 01/20/2013  . Smokeless tobacco: Never Used     Comment: Pt states she quit smoking 2-3 weeks ago. Resumed smoking September 2013 --2-3 CIGS DAILY AND E-CIG  . Alcohol Use: Yes     Comment: occasionally   OB History   Grav Para Term Preterm Abortions TAB SAB Ect Mult Living  Review of Systems  All other systems reviewed and are negative.    Allergies  Ciprofloxacin; Latex; Sulfamethoxazole w-trimethoprim; Azithromycin; Doxycycline; Metformin and related; and Metronidazole  Home Medications   Current Outpatient Rx  Name  Route  Sig  Dispense  Refill  . albuterol (PROVENTIL HFA;VENTOLIN HFA) 108 (90 BASE) MCG/ACT inhaler   Inhalation   Inhale 2 puffs into the lungs every 6 (six) hours as needed. For wheeze or shortness of breath   1 Inhaler   0   . albuterol (PROVENTIL) (2.5 MG/3ML) 0.083% nebulizer solution      USE 1 VIAL VIA NEBULIZER EVERY 6 HOURS AS NEEDED FOR WHEEZE OR SHORTNESS OF BREATH   75 mL   4   . ALPRAZolam (XANAX) 1 MG tablet      TAKE 1 TABLET BY MOUTH TWICE DAILY AS NEEDED   60 tablet   5   . budesonide-formoterol  (SYMBICORT) 160-4.5 MCG/ACT inhaler   Inhalation   Inhale 2 puffs into the lungs 2 (two) times daily.   1 Inhaler   2   . cetirizine (ZYRTEC) 1 MG/ML syrup      TAKE 2 TEASPOONFULS(10ML) BY MOUTH AT BEDTIME   300 mL   0   . esomeprazole (NEXIUM) 40 MG capsule   Oral   Take 1 capsule (40 mg total) by mouth 2 (two) times daily.   60 capsule   5   . glimepiride (AMARYL) 1 MG tablet   Oral   Take 1 mg by mouth daily before breakfast.         . glimepiride (AMARYL) 1 MG tablet      TAKE 1 TABLET BY MOUTH DAILY BEFORE BREAKFAST   30 tablet   5   . ibuprofen (ADVIL,MOTRIN) 200 MG tablet   Oral   Take 400 mg by mouth every 6 (six) hours as needed for pain.         Marland Kitchen ipratropium (ATROVENT) 0.02 % nebulizer solution   Nebulization   Take 2.5 mLs (0.5 mg total) by nebulization every 6 (six) hours.   75 mL   0   . LANTUS SOLOSTAR 100 UNIT/ML SOPN      INJECT 16 UNITS INTO THE SKIN AT BEDTIME   15 mL   0   . mometasone (NASONEX) 50 MCG/ACT nasal spray   Nasal   Place 2 sprays into the nose daily.         . Oxycodone HCl 10 MG TABS   Oral   Take 0.5-1 tablets (5-10 mg total) by mouth 3 (three) times daily as needed.   90 tablet   0     Fill on or after 05/09/2013   . potassium chloride SA (K-DUR,KLOR-CON) 20 MEQ tablet   Oral   Take 2 tablets (40 mEq total) by mouth 3 (three) times daily.   180 tablet   3   . sertraline (ZOLOFT) 50 MG tablet   Oral   Take 1 tablet (50 mg total) by mouth daily.   30 tablet   3   . spironolactone (ALDACTONE) 25 MG tablet   Oral   Take 25 mg by mouth 2 (two) times daily.         Marland Kitchen tiotropium (SPIRIVA) 18 MCG inhalation capsule   Inhalation   Place 18 mcg into inhaler and inhale daily.         Marland Kitchen torsemide (DEMADEX) 100 MG tablet   Oral   Take 1 tablet (100 mg total) by  mouth 2 (two) times daily. X 2 weeks, then as directed   60 tablet   3   . traMADol (ULTRAM) 50 MG tablet   Oral   Take 50 mg by mouth every 6  (six) hours as needed for pain.          BP 116/66  Pulse 88  Temp(Src) 98.1 F (36.7 C) (Oral)  Resp 21  Ht 5\' 8"  (1.727 m)  Wt 290 lb (131.543 kg)  BMI 44.1 kg/m2  SpO2 96%  LMP 10/16/2012  Physical Exam General: Well-developed, obese female in no acute distress; appearance consistent with age of record HENT: normocephalic, atraumatic Eyes: pupils equal round and reactive to light; extraocular muscles intact Neck: supple Heart: regular rate and rhythm Lungs: clear to auscultation bilaterally Abdomen: soft; obese; nontender; bowel sounds present Extremities: No deformity; full range of motion; trace edema of lower leg Neurologic: Awake, alert and oriented; motor function intact in all extremities and symmetric; no facial droop Skin: Warm and dry Psychiatric: Depressed with congruent affect; tearful; suicidal ideation    ED Course   Procedures (including critical care time)    MDM   Nursing notes and vitals signs, including pulse oximetry, reviewed.  Summary of this visit's results, reviewed by myself:  Labs:  Results for orders placed during the hospital encounter of 05/08/13 (from the past 24 hour(s))  URINALYSIS, ROUTINE W REFLEX MICROSCOPIC     Status: None   Collection Time    05/08/13  8:55 PM      Result Value Range   Color, Urine YELLOW  YELLOW   APPearance CLEAR  CLEAR   Specific Gravity, Urine 1.011  1.005 - 1.030   pH 5.5  5.0 - 8.0   Glucose, UA NEGATIVE  NEGATIVE mg/dL   Hgb urine dipstick NEGATIVE  NEGATIVE   Bilirubin Urine NEGATIVE  NEGATIVE   Ketones, ur NEGATIVE  NEGATIVE mg/dL   Protein, ur NEGATIVE  NEGATIVE mg/dL   Urobilinogen, UA 0.2  0.0 - 1.0 mg/dL   Nitrite NEGATIVE  NEGATIVE   Leukocytes, UA NEGATIVE  NEGATIVE  PREGNANCY, URINE     Status: None   Collection Time    05/08/13  8:55 PM      Result Value Range   Preg Test, Ur NEGATIVE  NEGATIVE  URINE RAPID DRUG SCREEN (HOSP PERFORMED)     Status: Abnormal   Collection Time     05/08/13  8:55 PM      Result Value Range   Opiates NONE DETECTED  NONE DETECTED   Cocaine POSITIVE (*) NONE DETECTED   Benzodiazepines NONE DETECTED  NONE DETECTED   Amphetamines NONE DETECTED  NONE DETECTED   Tetrahydrocannabinol NONE DETECTED  NONE DETECTED   Barbiturates NONE DETECTED  NONE DETECTED  CBC WITH DIFFERENTIAL     Status: None   Collection Time    05/08/13  9:40 PM      Result Value Range   WBC SPECIMEN CLOTTED  4.0 - 10.5 K/uL   RBC SPECIMEN CLOTTED  3.87 - 5.11 MIL/uL   Hemoglobin SPECIMEN CLOTTED  12.0 - 15.0 g/dL   HCT SPECIMEN CLOTTED  36.0 - 46.0 %   MCV SPECIMEN CLOTTED  78.0 - 100.0 fL   MCH SPECIMEN CLOTTED  26.0 - 34.0 pg   MCHC SPECIMEN CLOTTED  30.0 - 36.0 g/dL   RDW SPECIMEN CLOTTED  11.5 - 15.5 %   Platelets SPECIMEN CLOTTED  150 - 400 K/uL   LUCs, % SPECIMEN CLOTTED  0 - 4 %   LUC, Absolute SPECIMEN CLOTTED  0.0 - 0.5 K/uL   Other SPECIMEN CLOTTED     Other 2 SPECIMEN CLOTTED     Neutrophils Relative % SPECIMEN CLOTTED  43 - 77 %   Neutro Abs SPECIMEN CLOTTED  1.7 - 7.7 K/uL   Band Neutrophils SPECIMEN CLOTTED  0 - 10 %   Lymphocytes Relative SPECIMEN CLOTTED  12 - 46 %   Lymphs Abs SPECIMEN CLOTTED  0.7 - 4.0 K/uL   Monocytes Relative SPECIMEN CLOTTED  3 - 12 %   Monocytes Absolute SPECIMEN CLOTTED  0.1 - 1.0 K/uL   Eosinophils Relative SPECIMEN CLOTTED  0 - 5 %   Eosinophils Absolute SPECIMEN CLOTTED  0.0 - 0.7 K/uL   Basophils Relative SPECIMEN CLOTTED  0 - 1 %   Basophils Absolute SPECIMEN CLOTTED  0.0 - 0.1 K/uL   WBC Morphology SPECIMEN CLOTTED     RBC Morphology SPECIMEN CLOTTED     Smear Review SPECIMEN CLOTTED     nRBC SPECIMEN CLOTTED  0 /100 WBC   Metamyelocytes Relative SPECIMEN CLOTTED     Myelocytes SPECIMEN CLOTTED     Promyelocytes Absolute SPECIMEN CLOTTED     Blasts SPECIMEN CLOTTED    COMPREHENSIVE METABOLIC PANEL     Status: Abnormal   Collection Time    05/08/13  9:40 PM      Result Value Range   Sodium 133 (*) 135 -  145 mEq/L   Potassium 4.0  3.5 - 5.1 mEq/L   Chloride 94 (*) 96 - 112 mEq/L   CO2 24  19 - 32 mEq/L   Glucose, Bld 112 (*) 70 - 99 mg/dL   BUN 11  6 - 23 mg/dL   Creatinine, Ser 3.22  0.50 - 1.10 mg/dL   Calcium 9.1  8.4 - 02.5 mg/dL   Total Protein 7.6  6.0 - 8.3 g/dL   Albumin 3.2 (*) 3.5 - 5.2 g/dL   AST 29  0 - 37 U/L   ALT 29  0 - 35 U/L   Alkaline Phosphatase 112  39 - 117 U/L   Total Bilirubin 0.5  0.3 - 1.2 mg/dL   GFR calc non Af Amer 86 (*) >90 mL/min   GFR calc Af Amer >90  >90 mL/min  ETHANOL     Status: None   Collection Time    05/08/13  9:40 PM      Result Value Range   Alcohol, Ethyl (B) 11  0 - 11 mg/dL  CBC WITH DIFFERENTIAL     Status: Abnormal   Collection Time    05/08/13 11:54 PM      Result Value Range   WBC 10.3  4.0 - 10.5 K/uL   RBC 5.37 (*) 3.87 - 5.11 MIL/uL   Hemoglobin 14.2  12.0 - 15.0 g/dL   HCT 42.7  06.2 - 37.6 %   MCV 80.3  78.0 - 100.0 fL   MCH 26.4  26.0 - 34.0 pg   MCHC 32.9  30.0 - 36.0 g/dL   RDW 28.3 (*) 15.1 - 76.1 %   Platelets 223  150 - 400 K/uL   Neutrophils Relative % 77  43 - 77 %   Neutro Abs 7.9 (*) 1.7 - 7.7 K/uL   Lymphocytes Relative 13  12 - 46 %   Lymphs Abs 1.4  0.7 - 4.0 K/uL   Monocytes Relative 7  3 - 12 %   Monocytes Absolute 0.7  0.1 - 1.0 K/uL   Eosinophils Relative 3  0 - 5 %   Eosinophils Absolute 0.3  0.0 - 0.7 K/uL   Basophils Relative 1  0 - 1 %   Basophils Absolute 0.1  0.0 - 0.1 K/uL  GLUCOSE, CAPILLARY     Status: Abnormal   Collection Time    05/09/13  1:53 AM      Result Value Range   Glucose-Capillary 145 (*) 70 - 99 mg/dL   Comment 1 Notify RN     EKG Interpretation:  Date & Time: 05/08/2013 10:56 PM  Rate: 89  Rhythm: normal sinus rhythm  QRS Axis: normal  Intervals: prolonged QT  ST/T Wave abnormalities: nonspecific T wave changes  Conduction Disutrbances:none  Narrative Interpretation:   Old EKG Reviewed: QTc longer       Hanley Seamen, MD 05/09/13 0101  Hanley Seamen,  MD 05/09/13 1914

## 2013-05-08 NOTE — ED Notes (Signed)
Pt's family took pt's cell phone and money home with her

## 2013-05-08 NOTE — ED Notes (Signed)
Pt BIB EMS. Pt states she is suicidal. Pt has been drinking alcohol and snorting cocaine today per EMS. Pt states she is tired of living and wants help. Pt ambulatory to triage with steady gait. Pt told EMS she had chest pain earlier. Pt stated she got chest pain because she was smoking cigarette. Pt is on home O2 per EMS.

## 2013-05-09 LAB — CBC WITH DIFFERENTIAL/PLATELET
Basophils Relative: 1 % (ref 0–1)
Eosinophils Absolute: 0.3 10*3/uL (ref 0.0–0.7)
Eosinophils Relative: 3 % (ref 0–5)
HCT: 43.1 % (ref 36.0–46.0)
Hemoglobin: 14.2 g/dL (ref 12.0–15.0)
MCH: 26.4 pg (ref 26.0–34.0)
MCHC: 32.9 g/dL (ref 30.0–36.0)
MCV: 80.3 fL (ref 78.0–100.0)
Monocytes Absolute: 0.7 10*3/uL (ref 0.1–1.0)
Monocytes Relative: 7 % (ref 3–12)
RDW: 15.6 % — ABNORMAL HIGH (ref 11.5–15.5)

## 2013-05-09 LAB — GLUCOSE, CAPILLARY

## 2013-05-09 NOTE — BH Assessment (Addendum)
Contacted the following facilities seeking psychiatric transfer:  [facility]: per Marquita Palms, need for oxygen is exclusionary [facility]: per Fleet Contras, fax information for review in the morning. Faxed clinical information. [facility]: No intake staff available. Instructed to call back after 7:00 am. [facility]: per Rosey Bath, need for oxygen is exclusionary [facility]: Spoke to [place] who said to fax information. Faxed clinical information. [facility]: faxed clinical information [facility]: per Marcelino Duster, need for oxygen is exclusionary [facility]: Multiple calls with no response [facility]: Per April, unit at capacity [facility]: Left voicemail for behavioral health intake [facility]: per [place], unit at capacity  [employer], [facility], [facility] Triage Specialist

## 2013-05-09 NOTE — ED Notes (Signed)
Notified RN, Hamby, pt. Given warm pack.

## 2013-05-09 NOTE — ED Notes (Addendum)
Pts belongings consisting of a ziploc bag with a bottle of Nexeum and a bottle of Aleve added to locker 32

## 2013-05-09 NOTE — Progress Notes (Signed)
Patient Identification:  [name] Date of Evaluation:  05/09/2013   History of Present Illness:  Patient came in with depressed mood and inability to take care of her self due to health issues.   Patient is on home oxygen for breathing and has a hx of COPD.  She states she uses alcohol and cocaine to treat her mood.  Patient is obese and is having difficulty  Moving around in the house.  She endorses suicide but denies homicidal ideation.  She denies AVH.  She states she is willing to be transferred to where she can receive care and she is cooperative and calm.  Past Psychiatric History:  Major depressive d/o Recurrent Severe, Substance abuse.   Past Medical History:     Past Medical History  Diagnosis Date  . Sleep apnea     noncompliant w/ CPAP  . Hypertension   . Hyperlipidemia   . Chronic headache   . Fibromyalgia     daily narcotics  . Anxiety     hx chronic BZ use, stopped 07/2010  . Anemia   . Pulmonary sarcoidosis     unimpressive CT chest 2011  . Colonic polyp   . GERD (gastroesophageal reflux disease)   . ALLERGIC RHINITIS   . Asthma   . CHF (congestive heart failure)     Diastolic with fluid overload, May, 2012, LVEF 60%  . Morbid obesity   . Depression   . Panic attacks   . Diabetes mellitus   . COPD (chronic obstructive pulmonary disease)     on home O2, moderate airflow obstruction, suspect d/t emphysema  . Obesity   . Elevated LFTs 09/2011  . Ovarian cyst   . Chronic back pain        Past Surgical History  Procedure Laterality Date  . Polypectomy  2011  . Lumbar microdiscectomy  07/06/2011    R L4-5, stern  . Back surgery    . Carpal tunnel release    . Steroid spinal injections    . Hysteroscopy w/d&c N/A 03/25/2013    Procedure: DILATATION AND CURETTAGE /HYSTEROSCOPY;  Surgeon: Lavina Hamman, MD;  Location: [facility];  Service: Gynecology;  Laterality: N/A;  . Dilation and curettage of uterus      Allergies:  Allergies  Allergen Reactions   . Ciprofloxacin     REACTION: nausea  . Latex     REACTION: rash, burning  . Sulfamethoxazole W-Trimethoprim     Bactrim REACTION: Projectile Vomiting  . Azithromycin     REACTION: resistant  . Doxycycline     REACTION: restistant  . Metformin And Related Other (See Comments)    Severe diarrhea  . Metronidazole Nausea And Vomiting    Current Medications:  Prior to Admission medications   Medication Sig Start Date End Date Taking? Authorizing Provider  albuterol (PROVENTIL HFA;VENTOLIN HFA) 108 (90 BASE) MCG/ACT inhaler Inhale 2 puffs into the lungs every 6 (six) hours as needed. For wheeze or shortness of breath 09/04/12  Yes Richarda Overlie, MD  albuterol (PROVENTIL) (2.5 MG/3ML) 0.083% nebulizer solution USE 1 VIAL VIA NEBULIZER EVERY 6 HOURS AS NEEDED FOR WHEEZE OR SHORTNESS OF BREATH 01/23/13  Yes Newt Lukes, MD  budesonide-formoterol (SYMBICORT) 160-4.5 MCG/ACT inhaler Inhale 2 puffs into the lungs 2 (two) times daily. 09/04/12  Yes Richarda Overlie, MD  cetirizine (ZYRTEC) 1 MG/ML syrup TAKE 2 TEASPOONFULS(10ML) BY MOUTH AT BEDTIME 05/07/13  Yes Nicki Reaper, NP  esomeprazole (NEXIUM) 40 MG capsule Take 1 capsule (40  mg total) by mouth 2 (two) times daily. 01/23/13  Yes Newt Lukes, MD  glimepiride (AMARYL) 1 MG tablet Take 1 mg by mouth daily before breakfast.   Yes Historical Provider, MD  glimepiride (AMARYL) 1 MG tablet TAKE 1 TABLET BY MOUTH DAILY BEFORE BREAKFAST 04/21/13  Yes Newt Lukes, MD  ibuprofen (ADVIL,MOTRIN) 200 MG tablet Take 400 mg by mouth every 6 (six) hours as needed for pain.   Yes Historical Provider, MD  LANTUS SOLOSTAR 100 UNIT/ML SOPN INJECT 16 UNITS INTO THE SKIN AT BEDTIME 03/28/13  Yes Nicki Reaper, NP  mometasone (NASONEX) 50 MCG/ACT nasal spray Place 2 sprays into the nose daily.   Yes Historical Provider, MD  Oxycodone HCl 10 MG TABS Take 0.5-1 tablets (5-10 mg total) by mouth 3 (three) times daily as needed. 05/07/13  Yes Nicki Reaper, NP   potassium chloride SA (K-DUR,KLOR-CON) 20 MEQ tablet Take 2 tablets (40 mEq total) by mouth 3 (three) times daily. 01/23/13  Yes Newt Lukes, MD  sertraline (ZOLOFT) 50 MG tablet Take 1 tablet (50 mg total) by mouth daily. 04/08/13  Yes Newt Lukes, MD  spironolactone (ALDACTONE) 25 MG tablet Take 25 mg by mouth 2 (two) times daily.   Yes Historical Provider, MD  tiotropium (SPIRIVA) 18 MCG inhalation capsule Place 18 mcg into inhaler and inhale daily.   Yes Historical Provider, MD  torsemide (DEMADEX) 100 MG tablet Take 100 mg by mouth 2 (two) times daily.   Yes Historical Provider, MD  traMADol (ULTRAM) 50 MG tablet Take 50 mg by mouth every 6 (six) hours as needed for pain.   Yes Historical Provider, MD  ALPRAZolam Prudy Feeler) 1 MG tablet TAKE 1 TABLET BY MOUTH TWICE DAILY AS NEEDED 02/03/13   Newt Lukes, MD    Social History:    reports that she has been smoking Cigarettes.  She has a 7.25 pack-year smoking history. She has never used smokeless tobacco. She reports that  drinks alcohol. She reports that she uses illicit drugs (Cocaine and Marijuana).   Family History:    Family History  Problem Relation Age of Onset  . Hypertension Mother   . Emphysema Father   . Hypertension Father   . Stomach cancer Father   . Allergies Brother   . Hypertension Brother   . Stomach cancer Brother   . Heart disease Mother   . Heart disease Father   . Heart disease Brother     Mental Status Examination/Evaluation:  Pt is disheveled, alert, oriented x4 with normal speech and normal motor behavior. She has good eyes contact and became tearful at times. Her thought process is coherent and goal directed and there is no indication she is responding to internal stimuli. Mood is depressed and sad and affect is congruent with mood. Memory and concentration appear to be within normal limits. Pt was cooperative throughout assessment and is willing to sign voluntarily into a psychiatric hospital.        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  AXIS IV other psychosocial or environmental problems, problems related to social environment and problems with primary support group  AXIS V 51-60 moderate symptoms     Assessment/Plan:  Consult with and face to face  Interview with Dr Ladona Ridgel We will continue to seek referral to any facility that is capable of handling her acquity
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
## 2013-05-09 NOTE — Progress Notes (Addendum)
Pt pending placement for inpatient psychiatric treatment.  Pt pending review at [facility] and [facility].   CSW left message with [facility] x2.  Per ACT, patient may be accepted to [facility] tomorrow pending bed availability will act call back in the am.  Pt pending review at [facility].  Pt declined from [facility], [facility], [facility] due to oxygen being exclusionary.  No Beds at [facility], [address], [address]   Catha Gosselin, LCSW [phone]  ED CSW .05/09/2013 11:04   CSW completed [facility] Referral, and obtained authorization [id number].

## 2013-05-09 NOTE — ED Notes (Signed)
Pt was able to ambulate to the bathroom and back to her room with assistance from the siter.

## 2013-05-09 NOTE — ED Notes (Signed)
Pt belongings are in locker # 32 

## 2013-05-09 NOTE — ED Notes (Signed)
Pt c/o of anxiety and requesting xanax

## 2013-05-10 LAB — GLUCOSE, CAPILLARY

## 2013-05-10 NOTE — ED Notes (Signed)
Patient has been in good spirits today. Has been talking to family members on phone. Has gotten up to take a shower this morning

## 2013-05-10 NOTE — ED Notes (Signed)
Family at bedside. 

## 2013-05-10 NOTE — Progress Notes (Signed)
[name], female   DOB: 1966-03-20, 47 y.o.   MRN: [id number] Patient is awake and alert and resting.  She is still waiting for [facility] placement.  Her medical needs are met as well as her psychiatric needs.  She is receiving her oxygen as ordered.  No behavior issues noted so far. Dahlia Byes PMHNP-BC

## 2013-05-10 NOTE — ED Notes (Signed)
Walk on the hall with the patient.

## 2013-05-10 NOTE — ED Notes (Signed)
Patient states that does not want to hurt herself right now. States she is home sick and want to go home to [place]. States that her chronic back pain won't allow her ride the bus home.

## 2013-05-11 ENCOUNTER — Encounter (HOSPITAL_COMMUNITY): Payer: Self-pay | Admitting: Registered Nurse

## 2013-05-11 DIAGNOSIS — R45851 Suicidal ideations: Secondary | ICD-10-CM

## 2013-05-11 DIAGNOSIS — F329 Major depressive disorder, single episode, unspecified: Secondary | ICD-10-CM

## 2013-05-11 LAB — GLUCOSE, CAPILLARY
Glucose-Capillary: 108 mg/dL — ABNORMAL HIGH (ref 70–99)
Glucose-Capillary: 149 mg/dL — ABNORMAL HIGH (ref 70–99)

## 2013-05-11 MED ORDER — MAGNESIUM HYDROXIDE 400 MG/5ML PO SUSP
30.0000 mL | Freq: Every day | ORAL | Status: DC | PRN
  Administered 2013-05-11: 30 mL via ORAL
  Filled 2013-05-11: qty 30

## 2013-05-11 MED ORDER — SERTRALINE HCL 50 MG PO TABS
100.0000 mg | ORAL_TABLET | Freq: Every day | ORAL | Status: DC
  Administered 2013-05-12 – 2013-05-14 (×3): 100 mg via ORAL
  Filled 2013-05-11 (×3): qty 2

## 2013-05-11 NOTE — BH Assessment (Addendum)
[facility] Assessment Progress Note  Update:  Received call from [facility] @ [facility] @ [phone] stating pt declined due to medical acuity.  Called [facility], spoke with [facility] @ [phone] stating pt is on their wait list for [facility].  Update:  Received call from [facility] and authorization number for [facility] [id number] from 10/15-10/21/14) was given.  [facility] referral form completed and called Sharma Covert at 1136 to complete phone referral.  Referral faxed to [facility] for review.  Update:  Called [facility], spoke with Sharma Covert @ [phone], who stated he knew nothing of this pt being in [facility] wait list.  Since [facility] referral and authorization cannot be located at this time, this clinician will redo the paperwork, call in the phone referral to [facility].  [place], spoke with [place] @ [phone] and she is to call me back once that information is retrieved.

## 2013-05-11 NOTE — ED Notes (Signed)
Patient ate 100% of her dinner  

## 2013-05-11 NOTE — ED Notes (Signed)
Pt is alert and oriented. Pt is breathing wnl (96%)  w/ o2 at 2l ./ o2 depend at home.  diminished breath sounds.  compliants of mild constipation gave MOM. Will cont to monitor . Remains alert and oriented. Denies SI at this time - states not at this moment do I feel Suicidal.

## 2013-05-11 NOTE — Progress Notes (Signed)
Patient resting.  Patient still complains of depression and suicidal ideations.  Patient remains on waiting list for [facility].  Patient medical needs and psychiatric need are met.  Will continue to monitor.  Patient continues to receive  Oxygen via nasal canula.  No noted behavior issues.   Will increase Zoloft up to 100 mg daily.     Shuvon B. Rankin FNP-BC Family Nurse Practitioner, Board Certified

## 2013-05-11 NOTE — ED Notes (Signed)
Psych MD at bedside

## 2013-05-11 NOTE — BH Assessment (Signed)
[facility] Assessment Progress Note  05/11/13. 0115.  Attempted to locate [facility], [facility], [facility] paperwork but it is not in pt's ED chart.  Paperwork was prepared by social work--it needs to be located and refaxed to [facility], as they do not currently have it and pt is not on the wait list at this point. Daleen Squibb, LCSW

## 2013-05-11 NOTE — BH Assessment (Signed)
Contacted the following facilities for psychiatric transfer:  [facility]: per Oda Kilts, at capacity [facility]: per Octavio Graves, at capacity [facility]: at [facility]: per Mikey Bussing, at [facility]: per Selena Batten, at capacity [facility]: per Olegario Messier, at capacity  [facility]: per Marquita Palms, need for oxygen is exclusionary [facility]: Per Clifton Custard, need for oxygen is exclusionary [facility]: need for oxygen is exclusionary [facility]: need for oxygen is exclusionary  Per Melodie Bouillon, LCSW,  CSW completed [facility] Referral, and obtained authorization [id number]. Called [facility] to confirm Pt was on wait list and Britta Mccreedy said there is no record of Pt referral. Notified Daleen Squibb, Triage Specialist at [facility], who will locate referral paperwork and fax it to [facility].   Harlin Rain Ria Comment, [facility] Triage Specialist

## 2013-05-11 NOTE — BH Assessment (Addendum)
Pt continues to be on wait list for [facility]. Contacted the following facilities to request Pt transfer for inpatient psychiatric treatment:  [facility]: per Marquita [place], need for oxygen is exclusionary  [facility]: per Clifton Custard, need for oxygen is exclusionary  [facility]: need for oxygen is exclusionary  [facility]: need for oxygen is exclusionary [employer]: per [place], declined due to medical acuity  [facility]: per Harriett Sine, beds available. Faxed clinical information to [phone].  [facility]: Multiple calls with no response [facility]: per [place], at capacity [facility]: at capacity [facility]: at capacity [facility]: per [place], at [employer]: per Jasmine December, at [employer]: at capacity   [employer] Patsy Baltimore, [facility], [facility] Triage Specialist

## 2013-05-11 NOTE — BH Assessment (Signed)
Contacted the following facilities for psychiatric transfer:   [facility]: left message x2 [facility]: at capacity [facility]: at capacity [place]: at capacity [facility]: left message x2 [place]: at capacity  Counselor to resubmit patient to [facility].

## 2013-05-11 NOTE — BHH Counselor (Signed)
Okey Regal from [facility] called at 1300 to inform TTS that pt is on Wait List.  [phone].  [facility] confirmed they have all info needed including authorization.  They will contact TTS when pt is off wait list and can come but until then pt is just on Wait List.

## 2013-05-12 MED ORDER — LORAZEPAM 1 MG PO TABS
0.0000 mg | ORAL_TABLET | Freq: Four times a day (QID) | ORAL | Status: AC
  Administered 2013-05-12 – 2013-05-14 (×2): 1 mg via ORAL
  Filled 2013-05-12 (×2): qty 1

## 2013-05-12 MED ORDER — LORAZEPAM 1 MG PO TABS: 0.0000 mg | ORAL_TABLET | Freq: Two times a day (BID) | ORAL | Status: DC

## 2013-05-12 MED ORDER — LORAZEPAM 2 MG/ML IJ SOLN: 1.0000 mg | Freq: Four times a day (QID) | INTRAMUSCULAR | Status: DC | PRN

## 2013-05-12 MED ORDER — LORAZEPAM 1 MG PO TABS
1.0000 mg | ORAL_TABLET | Freq: Four times a day (QID) | ORAL | Status: DC | PRN
  Administered 2013-05-14: 1 mg via ORAL
  Filled 2013-05-12 (×2): qty 1

## 2013-05-12 NOTE — Progress Notes (Signed)
CSW continuing to assist with placement for inpatient psychiatric treatment.  Pt continues to be on wait list for [facility]. Contacted the following facilities to request Pt transfer for inpatient psychiatric treatment:  [facility]: per Marquita [place], need for oxygen is exclusionary  [facility]: per Clifton Custard, need for oxygen is exclusionary  [facility]: need for oxygen is exclusionary  [facility]: need for oxygen is exclusionary  [employer]: per [place], declined due to medical acuity  [facility]: per Harriett Sine, beds available. Faxed clinical information to [phone].   [facility]: Multiple calls with no response  [facility]: per [place], at capacity  [facility]: at capacity  [facility]: at capacity  [facility]: per [place], at [employer]: per Jasmine December, at [employer]: at capacity  [employer], LCSW .05/12/2013 1503pm

## 2013-05-12 NOTE — BH Assessment (Signed)
TC to Jeffrey at [facility]. He says that pt is still on [facility] wait[facility].  Kena Limon Paige Franciso Dierks, LCSWA Assessment Counselor  

## 2013-05-12 NOTE — Progress Notes (Signed)
[name], female   DOB: 01/16/1966, 47 y.o.   MRN: [id number] Patient is still waiting for acceptance at [facility], she is still feeling suicidal, paranoia and is unable to contract for safety.  We will continue to monitor patient and meet her medical and psychiatric needs. Dahlia Byes  PMHNP-BC

## 2013-05-12 NOTE — ED Provider Notes (Signed)
CIWA protocol has expired.  Re ordered.  Celene Kras, MD 05/12/13 1136

## 2013-05-12 NOTE — ED Notes (Signed)
Pt given a sandwich and a Cran-Grape juice.

## 2013-05-13 MED ORDER — DIPHENHYDRAMINE HCL 25 MG PO CAPS
50.0000 mg | ORAL_CAPSULE | Freq: Four times a day (QID) | ORAL | Status: DC | PRN
  Administered 2013-05-13: 50 mg via ORAL
  Filled 2013-05-13: qty 2

## 2013-05-13 NOTE — Progress Notes (Signed)
Note reviewed and agreed with  

## 2013-05-13 NOTE — Progress Notes (Addendum)
CSW called [facility] [phone].  Left vm.  Karle Plumber  [phone]  .05/13/2013 4:50 pm  CSW spoke with Thayer Ohm @ [facility].  Pts information is still pending review.  Hope to review it later this evening and will call back.    Karle Plumber [phone] 05/13/2013 6:30 pm

## 2013-05-13 NOTE — Progress Notes (Signed)
[name] says she is still suicidal and feels guilty that she is not getting better.  Says she needs somebody to talk to everyday and  that might help.  She felt some better yesterday, she says, but today she is down again.  Will try to get somebody for her to talk to in hopes she can get better and not have to go to [facility].

## 2013-05-13 NOTE — Progress Notes (Addendum)
CSW continuing to assist with placement for inpatient psychiatric treatment.   Pt continues to be on wait list for [facility] per Leotis Shames at 6:15 am    Contacted the following facilities to request Pt transfer for inpatient psychiatric treatment:   [facility]: per Marquita [place], need for oxygen is exclusionary  [facility]: per Clifton Custard, need for oxygen is exclusionary  [facility]: need for oxygen is exclusionary  Pt on [facility] waitlist  Pt pending review at [place].  [facility]: need for oxygen is exclusionary  [facility]: per [place], declined due to medical acuity  [facility], declined due to acuity.   [facility]: per Harriett Sine, beds available. Faxed clinical information to [phone].    [facility]: at [employer]: at [employer]: at capacity   .Catha Gosselin, [place] [phone]  ED CSW .05/13/2013 8:21am

## 2013-05-14 MED ORDER — SERTRALINE HCL 50 MG PO TABS: 100.0000 mg | ORAL_TABLET | Freq: Every day | ORAL | Status: DC

## 2013-05-14 NOTE — ED Notes (Signed)
Patient RA 90%, while ambulating RA without oxygen. Oxygen Sats are 80%. Then applied 2 LPM of oxygen back to patient while walking and oxygen sats are 94%

## 2013-05-14 NOTE — ED Notes (Signed)
Patient is alert and oriented x3.  She was given DC instructions and follow up visit instructions.  Patient gave verbal understanding. She was DC ambulatory under her own power to home.  V/S stable.  He was not showing any signs of distress on DC 

## 2013-05-14 NOTE — Progress Notes (Signed)
[facility] faxed home oxygen orders to [employer] at [phone] at 1657pm.  Received confirmation of fax at 1700pm.

## 2013-05-14 NOTE — ED Provider Notes (Signed)
4:14 PM His been seen and evaluated by the psychiatric team.  The patient is found to not be a threat to herself or to others and from a psychiatric standpoint the patient is stable for discharge.  From a medical standpoint the patient is safe for discharge.  Case management was involved and home oxygen was ordered but she has been set up through case management.  She needs home oxygen for 3 L nasal cannula.  She's been told that she needs to stop smoking cigarettes.  Lyanne Co, MD 05/14/13 [phone]

## 2013-05-14 NOTE — Progress Notes (Signed)
[facility] consulted for possible home health services.  EDCM spoke to patient at bedside. As per patient, "I live alone, I don't have anyone."  EDCM explained to patient since she did not have a primary care giver, she would not be eligible for home health services.  Patient interested in [facility] to help with housekeeping etc. Referral to [facility] initiated. Patient also wants to switch from [facility] to [facility] for home oxygen.  Patient also reports she has a bipap machine, and a nebulizer from [facility].  EDCM spoke to representative at [facility] who stated to, "Only fax the order for oxygen for now.  The bipap and the nebulizer should be the patient's now since it was processed through her insurance.  If she needs supplies for her bipap machine, we will be able to supply that for her, but we will not be able to provide her a new bipap machine.  Her oxygen will not be provided to her until it is approved by her insurance."  [facility] informed patient of above and advised patient not to cancel [facility] until she is certain that [employer] has approved her.  Patient verbalizes understanding.  No further needs at this time.

## 2013-05-14 NOTE — Progress Notes (Signed)
Per discussion with psychiatrist and NP, patient psychiatrically stable for dc home. CSW met with pt at bedside to offer resouces. Pt plans to follow up with [facility] and is interested in starting with an act team through [employer]. Patient also provided mobile crisis number. Pt requested to speak with NP to discuss medications. Patient signed no harm contract with NP. Pt interested in Home health services, CSW consulted CM who will assess patient needs.   Catha Gosselin, LCSW [phone]  ED CSW .05/14/2013 [id number]

## 2013-05-14 NOTE — Progress Notes (Signed)
Follow up Progress Note: Face to face interview and consult with Dr. Robert Bellow [id number]  Subjective:  Patient states that she is feeling better today.  Patient states that she had a bad day yesterday and was thinking of drinking (alcohol).  Patient states that she is able to contract for safety and is interested in setting up outpatient services.  Patient states that she is unable to do IOP related to having to have transport daily.  Patient states that she is able to do transport several times a week but not daily.  Patient states that she lives alone but her sister is close by and supportive.  During the interview patient denies suicidal ideation, homicidal ideation, psychosis, or paranoia.  Patient aware that she can start receiving therapy quicker as an outpatient as opposed to waiting on a bed at [facility].  Patient states that she feels safe to go home and follow up with [facility].  Patient states that she may also need assistance with home health.  Contract for safety discussed and signed by patient .  Understanding voiced by patient.     Current Medication Current facility-administered medications:acetaminophen (TYLENOL) tablet 650 mg, 650 mg, Oral, Q4H PRN, Carlisle Beers Molpus, MD, 650 mg at 05/13/13 0546;  albuterol (PROVENTIL) (5 MG/ML) 0.5% nebulizer solution 2.5 mg, 2.5 mg, Nebulization, Q4H PRN, Carlisle Beers Molpus, MD, 2.5 mg at 05/12/13 2346;  ALPRAZolam (XANAX) tablet 1 mg, 1 mg, Oral, BID PRN, Carlisle Beers Molpus, MD, 1 mg at 05/13/13 2349 alum & mag hydroxide-simeth (MAALOX/MYLANTA) 200-200-20 MG/5ML suspension 30 mL, 30 mL, Oral, PRN, Carlisle Beers Molpus, MD;  budesonide-formoterol (SYMBICORT) 160-4.5 MCG/ACT inhaler 2 puff, 2 puff, Inhalation, BID, Carlisle Beers Molpus, MD, 2 puff at 05/13/13 1926;  diphenhydrAMINE (BENADRYL) capsule 50 mg, 50 mg, Oral, Q6H PRN, Gilda Crease, MD, 50 mg at 05/13/13 0105 glimepiride (AMARYL) tablet 1 mg, 1 mg, Oral, QAC breakfast, Carlisle Beers Molpus, MD, 1 mg at  05/14/13 0981;  ibuprofen (ADVIL,MOTRIN) tablet 400 mg, 400 mg, Oral, Q6H PRN, John L Molpus, MD;  insulin glargine (LANTUS) injection 16 Units, 16 Units, Subcutaneous, QHS, John L Molpus, MD, 16 Units at 05/13/13 2214;  ipratropium (ATROVENT) nebulizer solution 0.5 mg, 0.5 mg, Nebulization, Q4H PRN, Carlisle Beers Molpus, MD LORazepam (ATIVAN) injection 1 mg, 1 mg, Intravenous, Q6H PRN, Celene Kras, MD;  LORazepam (ATIVAN) tablet 0-4 mg, 0-4 mg, Oral, Q6H, Celene Kras, MD, 1 mg at 05/14/13 0139;  LORazepam (ATIVAN) tablet 0-4 mg, 0-4 mg, Oral, Q12H, Celene Kras, MD;  LORazepam (ATIVAN) tablet 1 mg, 1 mg, Oral, Q6H PRN, Celene Kras, MD;  magnesium hydroxide (MILK OF MAGNESIA) suspension 30 mL, 30 mL, Oral, Daily PRN, Shon Baton, MD, 30 mL at 05/11/13 1956 nicotine (NICODERM CQ - dosed in mg/24 hours) patch 21 mg, 21 mg, Transdermal, Daily, Carlisle Beers Molpus, MD, 21 mg at 05/14/13 1057;  oxyCODONE (Oxy IR/ROXICODONE) immediate release tablet 5-10 mg, 5-10 mg, Oral, TID PRN, Carlisle Beers Molpus, MD, 5 mg at 05/14/13 1059;  pantoprazole (PROTONIX) EC tablet 40 mg, 40 mg, Oral, Daily, Carlisle Beers Molpus, MD, 40 mg at 05/14/13 1053 potassium chloride SA (K-DUR,KLOR-CON) CR tablet 40 mEq, 40 mEq, Oral, BID, John L Molpus, MD, 40 mEq at 05/14/13 1054;  sertraline (ZOLOFT) tablet 100 mg, 100 mg, Oral, Daily, Shuvon Rankin, NP, 100 mg at 05/14/13 1054;  spironolactone (ALDACTONE) tablet 25 mg, 25 mg, Oral, BID, John L Molpus, MD, 25 mg at 05/14/13 1053;  thiamine (B-1) injection 100 mg, 100 mg, Intravenous, Daily, John L Molpus, MD thiamine (VITAMIN B-1) tablet 100 mg, 100 mg, Oral, Daily, John L Molpus, MD, 100 mg at 05/14/13 1056;  tiotropium (SPIRIVA) inhalation capsule 18 mcg, 18 mcg, Inhalation, Daily, Carlisle Beers Molpus, MD, 18 mcg at 05/13/13 0916;  torsemide (DEMADEX) tablet 100 mg, 100 mg, Oral, BID, John L Molpus, MD, 100 mg at 05/14/13 1056 Current outpatient prescriptions:albuterol (PROVENTIL HFA;VENTOLIN HFA) 108 (90 BASE)  MCG/ACT inhaler, Inhale 2 puffs into the lungs every 6 (six) hours as needed. For wheeze or shortness of breath, Disp: 1 Inhaler, Rfl: 0;  albuterol (PROVENTIL) (2.5 MG/3ML) 0.083% nebulizer solution, USE 1 VIAL VIA NEBULIZER EVERY 6 HOURS AS NEEDED FOR WHEEZE OR SHORTNESS OF BREATH, Disp: 75 mL, Rfl: 4 budesonide-formoterol (SYMBICORT) 160-4.5 MCG/ACT inhaler, Inhale 2 puffs into the lungs 2 (two) times daily., Disp: 1 Inhaler, Rfl: 2;  cetirizine (ZYRTEC) 1 MG/ML syrup, TAKE 2 TEASPOONFULS(10ML) BY MOUTH AT BEDTIME, Disp: 300 mL, Rfl: 0;  esomeprazole (NEXIUM) 40 MG capsule, Take 1 capsule (40 mg total) by mouth 2 (two) times daily., Disp: 60 capsule, Rfl: 5 glimepiride (AMARYL) 1 MG tablet, Take 1 mg by mouth daily before breakfast., Disp: , Rfl: ;  glimepiride (AMARYL) 1 MG tablet, TAKE 1 TABLET BY MOUTH DAILY BEFORE BREAKFAST, Disp: 30 tablet, Rfl: 5;  ibuprofen (ADVIL,MOTRIN) 200 MG tablet, Take 400 mg by mouth every 6 (six) hours as needed for pain., Disp: , Rfl: ;  LANTUS SOLOSTAR 100 UNIT/ML SOPN, INJECT 16 UNITS INTO THE SKIN AT BEDTIME, Disp: 15 mL, Rfl: 0 mometasone (NASONEX) 50 MCG/ACT nasal spray, Place 2 sprays into the nose daily., Disp: , Rfl: ;  Oxycodone HCl 10 MG TABS, Take 0.5-1 tablets (5-10 mg total) by mouth 3 (three) times daily as needed., Disp: 90 tablet, Rfl: 0;  potassium chloride SA (K-DUR,KLOR-CON) 20 MEQ tablet, Take 2 tablets (40 mEq total) by mouth 3 (three) times daily., Disp: 180 tablet, Rfl: 3 sertraline (ZOLOFT) 50 MG tablet, Take 1 tablet (50 mg total) by mouth daily., Disp: 30 tablet, Rfl: 3;  spironolactone (ALDACTONE) 25 MG tablet, Take 25 mg by mouth 2 (two) times daily., Disp: , Rfl: ;  tiotropium (SPIRIVA) 18 MCG inhalation capsule, Place 18 mcg into inhaler and inhale daily., Disp: , Rfl: ;  torsemide (DEMADEX) 100 MG tablet, Take 100 mg by mouth 2 (two) times daily., Disp: , Rfl:  traMADol (ULTRAM) 50 MG tablet, Take 50 mg by mouth every 6 (six) hours as needed for  pain., Disp: , Rfl: ;  ALPRAZolam (XANAX) 1 MG tablet, TAKE 1 TABLET BY MOUTH TWICE DAILY AS NEEDED, Disp: 60 tablet, Rfl: 5  Assessment @ Axis I: Major Depression, Recurrent severe and Cocaine and Alcohol Abuse Axis II: Deferred Axis III:  Past Medical History  Diagnosis Date  . Sleep apnea     noncompliant w/ CPAP  . Hypertension   . Hyperlipidemia   . Chronic headache   . Fibromyalgia     daily narcotics  . Anxiety     hx chronic BZ use, stopped 07/2010  . Anemia   . Pulmonary sarcoidosis     unimpressive CT chest 2011  . Colonic polyp   . GERD (gastroesophageal reflux disease)   . ALLERGIC RHINITIS   . Asthma   . CHF (congestive heart failure)     Diastolic with fluid overload, May, 2012, LVEF 60%  . Morbid obesity   . Depression   . Panic attacks   .  Diabetes mellitus   . COPD (chronic obstructive pulmonary disease)     on home O2, moderate airflow obstruction, suspect d/t emphysema  . Obesity   . Elevated LFTs 09/2011  . Ovarian cyst   . Chronic back pain    Axis IV: other psychosocial or environmental problems and problems related to social environment Axis V: 51-60 moderate symptoms    Plan Recommendation/Disposition:  Discharge home with outpatient resources.  Patient to follow up with [facility].  SW will assess if patient qualifies for home health.    Shuvon Rankin, FNP-BC  I have personally seen the patient and agreed with the findings and involved in the treatment plan. Kathryne Sharper, MD

## 2013-05-14 NOTE — Progress Notes (Addendum)
Consulted by ED SW & BH NP about assist with home health services Pt states lives alone but has a friend who uses [facility] [address], [address] [place], [place] [address] [phone] Fax: [phone] Spoke with josephine at [facility] to initiate referral. Julieanne Cotton provided with demographics and [facility] ED contact information to reach pt to discuss services Pt maybe able to get 3 plus hours/day She requested Cm contact pcp office about pending pcs forms for pt At 1522 Cm spoke with Eber Jones at Dr Felicity Coyer office [phone]; Fax: [phone] to notify of pending pcs forms  1609 Cm spoke with pt and provided written contact information for [facility] (pt agreed to services of [facility]), discussed pcp has been called and discussed with the sitter/RN how to enter ambulatory O2 sat results in EPIC. Pt inquired about supplemental insurance coverage for the 20% not covered my medicaid for future therapy Pt interested in water PT therapy. Provided pt with information for [employer] and inclusive health.  Pt states she can ambulate in her home to answer the door, her ADLs but unable to clean her home (which is why she is interested also in [facility]) Cm encouraged pt to have her sister and other family and friends to stay with her until pcs started by next week Pt insisted she would be "okay. I can do for myself"   PACE program offered to pt when she mentioned she "going home to sit in the house" but she refused the resource stating she is not old enough Medicaid transportation resource provided to assist with getting pt out of the home

## 2013-05-15 NOTE — Progress Notes (Signed)
[facility] ED CM left voice message on pt's home and mobile numbers listed in EPIC related medications Requested return call to [facility] ED Left contact number Pending a return call

## 2013-05-15 NOTE — Progress Notes (Addendum)
[facility] ED CM consulted with [facility] community liaison Dolora related to [place] and community services for pt   Return call from [place] after speaking with the pt Reports pt refused in home [facility] therapy with preference for out patient [facility] therapy and states she (the pt) will contact Patrc[facility] therapy

## 2013-05-19 ENCOUNTER — Ambulatory Visit: Payer: PRIVATE HEALTH INSURANCE | Admitting: Cardiology

## 2013-05-28 ENCOUNTER — Other Ambulatory Visit: Payer: Self-pay | Admitting: Pulmonary Disease

## 2013-05-30 ENCOUNTER — Ambulatory Visit: Payer: PRIVATE HEALTH INSURANCE | Admitting: Internal Medicine

## 2013-05-30 ENCOUNTER — Encounter: Payer: Self-pay | Admitting: Pulmonary Disease

## 2013-05-30 ENCOUNTER — Ambulatory Visit (INDEPENDENT_AMBULATORY_CARE_PROVIDER_SITE_OTHER): Payer: PRIVATE HEALTH INSURANCE | Admitting: Pulmonary Disease

## 2013-05-30 VITALS — BP 124/76 | HR 102 | Temp 98.0°F | Ht 68.5 in | Wt 293.2 lb

## 2013-05-30 DIAGNOSIS — J449 Chronic obstructive pulmonary disease, unspecified: Secondary | ICD-10-CM

## 2013-05-30 DIAGNOSIS — G4733 Obstructive sleep apnea (adult) (pediatric): Secondary | ICD-10-CM

## 2013-05-30 DIAGNOSIS — E662 Morbid (severe) obesity with alveolar hypoventilation: Secondary | ICD-10-CM

## 2013-05-30 DIAGNOSIS — J4489 Other specified chronic obstructive pulmonary disease: Secondary | ICD-10-CM

## 2013-05-30 MED ORDER — TIOTROPIUM BROMIDE MONOHYDRATE 18 MCG IN CAPS: 18.0000 ug | ORAL_CAPSULE | Freq: Every day | RESPIRATORY_TRACT | Status: DC

## 2013-05-30 NOTE — Patient Instructions (Addendum)
Continue on current breathing medications. Keep working on weight loss, and stay on bipap. Watch fluid balance closely Stop ALL smoking.  You can do it! followup with me in 6mos if doing well.

## 2013-05-30 NOTE — Progress Notes (Signed)
  Subjective:    [name], female    DOB: 1966-07-27, 47 y.o.   MRN: [id number]  HPI The patient comes in today for followup of her COPD and obesity hypoventilation syndrome with sleep apnea.  She has been doing well since the last visit, and has not had a recent COPD exacerbation.  She has been trying to totally stop smoking, and for the most part has been successful.  She is wearing her bilevel device compliantly, and is having no issues with her mask fit or pressure.  She also has a history of chronic diastolic heart failure, and is being managed by her primary physician.  She has not had significant increase in lower extremity edema.   Review of Systems  Constitutional: Negative for fever and unexpected weight change.  HENT: Negative for ear pain, nosebleeds, congestion, sore throat, rhinorrhea, sneezing, trouble swallowing, dental problem, postnasal drip and sinus pressure.   Eyes: Negative for redness and itching.  Respiratory: Negative for cough, chest tightness, shortness of breath and wheezing.   Cardiovascular: Negative for palpitations and leg swelling.  Gastrointestinal: Negative for nausea and vomiting.  Genitourinary: Negative for dysuria.  Musculoskeletal: Negative for joint swelling.  Skin: Negative for rash.  Neurological: Negative for headaches.  Hematological: Does not bruise/bleed easily.  Psychiatric/Behavioral: Negative for dysphoric mood. The patient is not nervous/anxious.        Objective:   Physical Exam Obese female in no acute distress Nose without purulence or discharge noted No skin breakdown or pressure necrosis from a CPAP mask Neck without lymphadenopathy or thyromegaly Chest surprisingly clear to auscultation, no wheezing Cardiac exam with regular rate and rhythm Lower extremities with no significant edema, no cyanosis Alert and oriented, moves all 4 extremities.       Assessment & Plan:

## 2013-05-30 NOTE — Assessment & Plan Note (Signed)
The patient is doing well with bilevel at this time, and is trying to work on weight loss.  She also has a component of obesity hypoventilation.

## 2013-05-30 NOTE — Assessment & Plan Note (Signed)
The patient appears to be stable from a COPD standpoint.  She is staying on her bronchodilator regimen, and for the most part stays away from cigarettes.  She does periodically smoke, and notices significant worsening in her breathing when she does so.

## 2013-06-02 ENCOUNTER — Ambulatory Visit: Payer: PRIVATE HEALTH INSURANCE | Admitting: Cardiology

## 2013-06-06 ENCOUNTER — Encounter: Payer: Self-pay | Admitting: Internal Medicine

## 2013-06-06 ENCOUNTER — Ambulatory Visit (INDEPENDENT_AMBULATORY_CARE_PROVIDER_SITE_OTHER): Payer: PRIVATE HEALTH INSURANCE | Admitting: Internal Medicine

## 2013-06-06 ENCOUNTER — Ambulatory Visit (HOSPITAL_COMMUNITY): Payer: Self-pay | Admitting: Psychiatry

## 2013-06-06 VITALS — BP 120/64 | HR 93 | Temp 98.2°F | Wt 284.0 lb

## 2013-06-06 DIAGNOSIS — Z23 Encounter for immunization: Secondary | ICD-10-CM

## 2013-06-06 DIAGNOSIS — E785 Hyperlipidemia, unspecified: Secondary | ICD-10-CM

## 2013-06-06 DIAGNOSIS — F329 Major depressive disorder, single episode, unspecified: Secondary | ICD-10-CM

## 2013-06-06 DIAGNOSIS — J449 Chronic obstructive pulmonary disease, unspecified: Secondary | ICD-10-CM

## 2013-06-06 DIAGNOSIS — E1165 Type 2 diabetes mellitus with hyperglycemia: Secondary | ICD-10-CM

## 2013-06-06 MED ORDER — MOMETASONE FUROATE 50 MCG/ACT NA SUSP: 2.0000 | Freq: Every day | NASAL | Status: DC

## 2013-06-06 MED ORDER — OXYCODONE HCL 10 MG PO TABS: 5.0000 mg | ORAL_TABLET | Freq: Three times a day (TID) | ORAL | Status: DC | PRN

## 2013-06-06 MED ORDER — GABAPENTIN 300 MG PO CAPS: 300.0000 mg | ORAL_CAPSULE | Freq: Every day | ORAL | Status: DC

## 2013-06-06 MED ORDER — ALBUTEROL SULFATE (2.5 MG/3ML) 0.083% IN NEBU: INHALATION_SOLUTION | RESPIRATORY_TRACT | Status: DC

## 2013-06-06 MED ORDER — ASPIRIN EC 81 MG PO TBEC: 81.0000 mg | DELAYED_RELEASE_TABLET | Freq: Every day | ORAL | Status: DC

## 2013-06-06 NOTE — Patient Instructions (Signed)
It was good to see you today. We have reviewed your prior records including labs and tests today Medications reviewed and updated, start gabapentin for your nighttime leg pain symptoms -no other changes recommended at this time. Refill on pain medications provided today Test(s) ordered today. Your results will be released to MyChart (or called to you) after review, usually within 72hours after test completion. If any changes need to be made, you will be notified at that same time. Followup in 3 months to review diabetes mellitus, depression, weight, fluid, breathing and other medical issues; call sooner if problems Your annual flu shot was given and/or updated today.

## 2013-06-06 NOTE — Assessment & Plan Note (Signed)
Remote simva poorly tolerated (leg cramps), not on meds in past few years Reviewed last lipids low dose atorva poorly tolerated early 2014

## 2013-06-06 NOTE — Assessment & Plan Note (Signed)
New dx, clarified 11/2011 - exacerbated by frequent pred use Intol of metformin due to severe diarrhea side effects  On Lantus pen qhs and Amaryl in AM - titrate as needed recheck a1c q 3-6 mo  Lab Results  Component Value Date   HGBA1C 6.3* 01/07/2013

## 2013-06-06 NOTE — Assessment & Plan Note (Signed)
chronic symptoms, exacerbated by acute and chronic medical illnesses Increased dose sertraline 03/2013 - doing well Continue same verified no SI/HI

## 2013-06-06 NOTE — Progress Notes (Signed)
Subjective:    [name], female    DOB: [birth year], 47 y.o.   MRN: [id number]  HPI Here for follow up -Reviewed chronic medical issues interval medical events  Past Medical History  Diagnosis Date  . Sleep apnea     noncompliant w/ CPAP  . Hypertension   . Hyperlipidemia   . Chronic headache   . Fibromyalgia     daily narcotics  . Anxiety     hx chronic BZ use, stopped 07/2010  . Anemia   . Pulmonary sarcoidosis     unimpressive CT chest 2011  . Colonic polyp   . GERD (gastroesophageal reflux disease)   . ALLERGIC RHINITIS   . Asthma   . CHF (congestive heart failure)     Diastolic with fluid overload, May, 2012, LVEF 60%  . Morbid obesity   . Depression   . Panic attacks   . Diabetes mellitus   . COPD (chronic obstructive pulmonary disease)     on home O2, moderate airflow obstruction, suspect d/t emphysema  . Obesity   . Elevated LFTs 09/2011  . Ovarian cyst   . Chronic back pain     Past Surgical History  Procedure Laterality Date  . Polypectomy  2011  . Lumbar microdiscectomy  07/06/2011    R L4-5, stern  . Back surgery    . Carpal tunnel release    . Steroid spinal injections    . Hysteroscopy w/d&c N/A 03/25/2013    Procedure: DILATATION AND CURETTAGE /HYSTEROSCOPY;  Surgeon: Lavina Hamman, MD;  Location: [facility];  Service: Gynecology;  Laterality: N/A;  . Dilation and curettage of uterus       Review of Systems  Constitutional: Negative for fever, fatigue and unexpected weight change.  Respiratory: Positive for cough (dry). Negative for shortness of breath and wheezing.   Cardiovascular: Negative for chest pain, palpitations and leg swelling.  Musculoskeletal:       Nighttime leg/foot pain  Psychiatric/Behavioral: Negative for suicidal ideas, sleep disturbance, self-injury and dysphoric mood. The patient is not nervous/anxious.        Objective:   Physical Exam BP 120/64  Pulse 93  Temp(Src) 98.2 F (36.8 C) (Oral)  Wt 284  lb (128.822 kg)  BMI 42.55 kg/m2  SpO2 96%  LMP 10/16/2012 Wt Readings from Last 3 Encounters:  06/06/13 284 lb (128.822 kg)  05/30/13 293 lb 3.2 oz (132.995 kg)  05/08/13 290 lb (131.543 kg)   Gen: obese, NAD. Not wearing nasal cannula oxygen today Lungs: CTA B, decreased air mvt at bases CV: distant but RRR, no BLE edema Abd: obese, soft, nontender, bowel sounds present Neuro: awake alert and oriented x4. No cranial nerve deficits. Moves all extremities well. Coordination, balance, gait are normal Psyc: normal mood/affect   Lab Results  Component Value Date   WBC 10.3 05/08/2013   HGB 14.2 05/08/2013   HCT 43.1 05/08/2013   PLT 223 05/08/2013   GLUCOSE 112* 05/08/2013   CHOL 255* 02/13/2012   TRIG 133.0 02/13/2012   HDL 54.30 02/13/2012   LDLDIRECT 186.4 02/13/2012   LDLCALC  Value: 92        Total Cholesterol/HDL:CHD Risk Coronary Heart Disease Risk Table                     Men   Women  1/2 Average Risk   3.4   3.3  Average Risk       5.0   4.4  2  X Average Risk   9.6   7.1  3 X Average Risk  23.4   11.0        Use the calculated Patient Ratio above and the CHD Risk Table to determine the patient's CHD Risk.        ATP III CLASSIFICATION (LDL):  <100     mg/dL   Optimal  409-811  mg/dL   Near or Above                    Optimal  130-159  mg/dL   Borderline  914-782  mg/dL   High  >956     mg/dL   Very High 11/08/863   ALT 29 05/08/2013   AST 29 05/08/2013   NA 133* 05/08/2013   K 4.0 05/08/2013   CL 94* 05/08/2013   CREATININE 0.81 05/08/2013   BUN 11 05/08/2013   CO2 24 05/08/2013   TSH 0.701 04/22/2012   HGBA1C 6.3* 01/07/2013       Assessment & Plan:   Nocturnal leg and foot pain - differential includes restless legs and diabetic neuropathy -will begin gabapentin each bedtime, titrate as needed.  Exam and history otherwise benign today  Also see problem list. Medications and labs reviewed today.

## 2013-06-06 NOTE — Assessment & Plan Note (Signed)
Chronic hypoxic resp failure with COPD -no recent flare  On bipap at home - baseline hypercarbia reviewed on chronic inhalers with when necessary nebulizer, continue home oxygen and followup with pulmonary specialist as planned

## 2013-06-23 ENCOUNTER — Telehealth: Payer: Self-pay | Admitting: Pulmonary Disease

## 2013-06-23 MED ORDER — CEFDINIR 300 MG PO CAPS: 600.0000 mg | ORAL_CAPSULE | ORAL | Status: DC

## 2013-06-23 MED ORDER — PREDNISONE 20 MG PO TABS: ORAL_TABLET | ORAL | Status: DC

## 2013-06-23 NOTE — Telephone Encounter (Signed)
Let pt know she has to completely stop smoking, or this will continue.  Call in omnicef 300mg , take 2 each am for 5 days Prednisone taper 20mg , take 40 for 2 days, 30 for 2, 20 for 2 , then stop Call us if not getting better, or go to ER if after hours.

## 2013-06-23 NOTE — Telephone Encounter (Signed)
Spoke with patient, made her aware of recs as listed below Patient verbalized understanding and is aware Rx have been sent in to verified pharmacy nothing further needed at this time

## 2013-06-23 NOTE — Telephone Encounter (Signed)
Called, spoke with pt - C/o increased DOE, runny nose with clear drainage, chest congestion, is coughing "a lot" - nonprod, has wheezing when laying down, chest "hurts"/ feels sore, and has no energy.  Symptoms started yesterday.  No f/c/s.  Pt is using albuterol hfa and albuterol nebs with "short term" relief.  Offered OV for today and tomorrow.  Pt states she doesn't have transportation and would like something called in, if possible.  Dr. Shelle Iron, pls advise.  Thank you.  Last OV with KC: 05/30/13  [facility] [employer]  Allergies verified with pt: Allergies  Allergen Reactions  . Ciprofloxacin     REACTION: nausea  . Latex     REACTION: rash, burning  . Sulfamethoxazole W-Trimethoprim     Bactrim REACTION: Projectile Vomiting  . Azithromycin     REACTION: resistant  . Doxycycline     REACTION: restistant  . Metformin And Related Other (See Comments)    Severe diarrhea  . Metronidazole Nausea And Vomiting  . Simvastatin

## 2013-06-24 ENCOUNTER — Encounter (HOSPITAL_COMMUNITY): Payer: Self-pay | Admitting: Emergency Medicine

## 2013-06-24 ENCOUNTER — Telehealth: Payer: Self-pay | Admitting: Pulmonary Disease

## 2013-06-24 ENCOUNTER — Emergency Department (HOSPITAL_COMMUNITY): Payer: PRIVATE HEALTH INSURANCE

## 2013-06-24 ENCOUNTER — Inpatient Hospital Stay (HOSPITAL_COMMUNITY)
Admission: EM | Admit: 2013-06-24 | Discharge: 2013-07-02 | DRG: 193 | Disposition: A | Discharge status: Home / Self Care | Payer: PRIVATE HEALTH INSURANCE | Attending: Internal Medicine | Admitting: Internal Medicine

## 2013-06-24 DIAGNOSIS — R002 Palpitations: Secondary | ICD-10-CM

## 2013-06-24 DIAGNOSIS — J99 Respiratory disorders in diseases classified elsewhere: Secondary | ICD-10-CM | POA: Diagnosis present

## 2013-06-24 DIAGNOSIS — J4489 Other specified chronic obstructive pulmonary disease: Secondary | ICD-10-CM

## 2013-06-24 DIAGNOSIS — F32A Depression, unspecified: Secondary | ICD-10-CM

## 2013-06-24 DIAGNOSIS — E1165 Type 2 diabetes mellitus with hyperglycemia: Secondary | ICD-10-CM

## 2013-06-24 DIAGNOSIS — R0789 Other chest pain: Secondary | ICD-10-CM

## 2013-06-24 DIAGNOSIS — J309 Allergic rhinitis, unspecified: Secondary | ICD-10-CM

## 2013-06-24 DIAGNOSIS — N939 Abnormal uterine and vaginal bleeding, unspecified: Secondary | ICD-10-CM

## 2013-06-24 DIAGNOSIS — J9610 Chronic respiratory failure, unspecified whether with hypoxia or hypercapnia: Secondary | ICD-10-CM

## 2013-06-24 DIAGNOSIS — IMO0001 Reserved for inherently not codable concepts without codable children: Secondary | ICD-10-CM

## 2013-06-24 DIAGNOSIS — R9430 Abnormal result of cardiovascular function study, unspecified: Secondary | ICD-10-CM

## 2013-06-24 DIAGNOSIS — Z6841 Body Mass Index (BMI) 40.0 and over, adult: Secondary | ICD-10-CM

## 2013-06-24 DIAGNOSIS — Z9119 Patient's noncompliance with other medical treatment and regimen: Secondary | ICD-10-CM

## 2013-06-24 DIAGNOSIS — N912 Amenorrhea, unspecified: Secondary | ICD-10-CM

## 2013-06-24 DIAGNOSIS — E785 Hyperlipidemia, unspecified: Secondary | ICD-10-CM

## 2013-06-24 DIAGNOSIS — K219 Gastro-esophageal reflux disease without esophagitis: Secondary | ICD-10-CM

## 2013-06-24 DIAGNOSIS — IMO0002 Reserved for concepts with insufficient information to code with codable children: Secondary | ICD-10-CM

## 2013-06-24 DIAGNOSIS — I10 Essential (primary) hypertension: Secondary | ICD-10-CM

## 2013-06-24 DIAGNOSIS — F1410 Cocaine abuse, uncomplicated: Secondary | ICD-10-CM | POA: Diagnosis present

## 2013-06-24 DIAGNOSIS — K5900 Constipation, unspecified: Secondary | ICD-10-CM | POA: Diagnosis present

## 2013-06-24 DIAGNOSIS — E662 Morbid (severe) obesity with alveolar hypoventilation: Secondary | ICD-10-CM

## 2013-06-24 DIAGNOSIS — G4733 Obstructive sleep apnea (adult) (pediatric): Secondary | ICD-10-CM

## 2013-06-24 DIAGNOSIS — R51 Headache: Secondary | ICD-10-CM

## 2013-06-24 DIAGNOSIS — M545 Low back pain, unspecified: Secondary | ICD-10-CM

## 2013-06-24 DIAGNOSIS — B370 Candidal stomatitis: Secondary | ICD-10-CM | POA: Diagnosis present

## 2013-06-24 DIAGNOSIS — J9620 Acute and chronic respiratory failure, unspecified whether with hypoxia or hypercapnia: Secondary | ICD-10-CM

## 2013-06-24 DIAGNOSIS — D869 Sarcoidosis, unspecified: Secondary | ICD-10-CM

## 2013-06-24 DIAGNOSIS — I5032 Chronic diastolic (congestive) heart failure: Secondary | ICD-10-CM | POA: Diagnosis present

## 2013-06-24 DIAGNOSIS — I5033 Acute on chronic diastolic (congestive) heart failure: Secondary | ICD-10-CM

## 2013-06-24 DIAGNOSIS — I509 Heart failure, unspecified: Secondary | ICD-10-CM | POA: Diagnosis present

## 2013-06-24 DIAGNOSIS — M25561 Pain in right knee: Secondary | ICD-10-CM

## 2013-06-24 DIAGNOSIS — Z91199 Patient's noncompliance with other medical treatment and regimen due to unspecified reason: Secondary | ICD-10-CM

## 2013-06-24 DIAGNOSIS — F411 Generalized anxiety disorder: Secondary | ICD-10-CM

## 2013-06-24 DIAGNOSIS — F1210 Cannabis abuse, uncomplicated: Secondary | ICD-10-CM | POA: Diagnosis present

## 2013-06-24 DIAGNOSIS — J189 Pneumonia, unspecified organism: Principal | ICD-10-CM

## 2013-06-24 DIAGNOSIS — J449 Chronic obstructive pulmonary disease, unspecified: Secondary | ICD-10-CM

## 2013-06-24 DIAGNOSIS — F329 Major depressive disorder, single episode, unspecified: Secondary | ICD-10-CM

## 2013-06-24 DIAGNOSIS — D649 Anemia, unspecified: Secondary | ICD-10-CM

## 2013-06-24 DIAGNOSIS — F17200 Nicotine dependence, unspecified, uncomplicated: Secondary | ICD-10-CM | POA: Diagnosis present

## 2013-06-24 DIAGNOSIS — J9600 Acute respiratory failure, unspecified whether with hypoxia or hypercapnia: Secondary | ICD-10-CM

## 2013-06-24 DIAGNOSIS — M5431 Sciatica, right side: Secondary | ICD-10-CM

## 2013-06-24 DIAGNOSIS — J441 Chronic obstructive pulmonary disease with (acute) exacerbation: Secondary | ICD-10-CM

## 2013-06-24 DIAGNOSIS — Z720 Tobacco use: Secondary | ICD-10-CM

## 2013-06-24 LAB — BASIC METABOLIC PANEL
BUN: 11 mg/dL (ref 6–23)
CO2: 28 mEq/L (ref 19–32)
Calcium: 9.2 mg/dL (ref 8.4–10.5)
Creatinine, Ser: 0.94 mg/dL (ref 0.50–1.10)
Glucose, Bld: 138 mg/dL — ABNORMAL HIGH (ref 70–99)

## 2013-06-24 LAB — CBC
MCH: 27.3 pg (ref 26.0–34.0)
MCHC: 34.6 g/dL (ref 30.0–36.0)
WBC: 8.3 10*3/uL (ref 4.0–10.5)

## 2013-06-24 LAB — MRSA PCR SCREENING: MRSA by PCR: NEGATIVE

## 2013-06-24 MED ORDER — IPRATROPIUM BROMIDE 0.02 % IN SOLN
1.0000 mg | Freq: Once | RESPIRATORY_TRACT | Status: AC
  Administered 2013-06-24: 1 mg via RESPIRATORY_TRACT
  Filled 2013-06-24: qty 5

## 2013-06-24 MED ORDER — INSULIN ASPART 100 UNIT/ML ~~LOC~~ SOLN
0.0000 [IU] | Freq: Three times a day (TID) | SUBCUTANEOUS | Status: DC
  Administered 2013-06-25 (×3): 5 [IU] via SUBCUTANEOUS
  Administered 2013-06-26 – 2013-06-29 (×10): 3 [IU] via SUBCUTANEOUS
  Administered 2013-06-29: 18:00:00 2 [IU] via SUBCUTANEOUS
  Administered 2013-06-29 – 2013-06-30 (×4): 3 [IU] via SUBCUTANEOUS
  Administered 2013-07-01: 2 [IU] via SUBCUTANEOUS
  Administered 2013-07-01: 17:00:00 5 [IU] via SUBCUTANEOUS
  Administered 2013-07-02 (×2): 2 [IU] via SUBCUTANEOUS

## 2013-06-24 MED ORDER — ALBUTEROL SULFATE (5 MG/ML) 0.5% IN NEBU
2.5000 mg | INHALATION_SOLUTION | RESPIRATORY_TRACT | Status: DC
  Administered 2013-06-24 – 2013-06-25 (×6): 2.5 mg via RESPIRATORY_TRACT
  Filled 2013-06-24 (×5): qty 0.5

## 2013-06-24 MED ORDER — METHYLPREDNISOLONE SODIUM SUCC 125 MG IJ SOLR
125.0000 mg | Freq: Once | INTRAMUSCULAR | Status: AC
  Administered 2013-06-24: 125 mg via INTRAVENOUS
  Filled 2013-06-24: qty 2

## 2013-06-24 MED ORDER — VANCOMYCIN HCL 10 G IV SOLR
2500.0000 mg | Freq: Once | INTRAVENOUS | Status: AC
  Administered 2013-06-24: 2500 mg via INTRAVENOUS
  Filled 2013-06-24: qty 2500

## 2013-06-24 MED ORDER — LEVOFLOXACIN IN D5W 750 MG/150ML IV SOLN
750.0000 mg | Freq: Once | INTRAVENOUS | Status: AC
  Administered 2013-06-24: 750 mg via INTRAVENOUS
  Filled 2013-06-24: qty 150

## 2013-06-24 MED ORDER — METHYLPREDNISOLONE SODIUM SUCC 125 MG IJ SOLR
60.0000 mg | Freq: Four times a day (QID) | INTRAMUSCULAR | Status: DC
  Administered 2013-06-24 – 2013-06-25 (×3): 60 mg via INTRAVENOUS
  Administered 2013-06-25: 17:00:00 via INTRAVENOUS
  Administered 2013-06-26 – 2013-06-27 (×7): 60 mg via INTRAVENOUS
  Filled 2013-06-24 (×14): qty 0.96

## 2013-06-24 MED ORDER — ALBUTEROL SULFATE (5 MG/ML) 0.5% IN NEBU: 5.0000 mg | INHALATION_SOLUTION | RESPIRATORY_TRACT | Status: DC | PRN

## 2013-06-24 MED ORDER — LORAZEPAM 2 MG/ML IJ SOLN
1.0000 mg | Freq: Four times a day (QID) | INTRAMUSCULAR | Status: DC | PRN
  Administered 2013-06-24 – 2013-07-02 (×15): 1 mg via INTRAVENOUS
  Filled 2013-06-24 (×15): qty 1

## 2013-06-24 MED ORDER — GABAPENTIN 300 MG PO CAPS
300.0000 mg | ORAL_CAPSULE | Freq: Every day | ORAL | Status: DC | PRN
  Administered 2013-06-27 – 2013-07-01 (×2): 600 mg via ORAL
  Filled 2013-06-24 (×3): qty 2

## 2013-06-24 MED ORDER — SERTRALINE HCL 50 MG PO TABS
50.0000 mg | ORAL_TABLET | Freq: Every day | ORAL | Status: DC
  Administered 2013-06-25 – 2013-07-02 (×8): 50 mg via ORAL
  Filled 2013-06-24 (×8): qty 1

## 2013-06-24 MED ORDER — IPRATROPIUM BROMIDE 0.02 % IN SOLN
0.5000 mg | RESPIRATORY_TRACT | Status: DC
  Administered 2013-06-24 – 2013-06-25 (×6): 0.5 mg via RESPIRATORY_TRACT
  Filled 2013-06-24 (×5): qty 2.5

## 2013-06-24 MED ORDER — ONDANSETRON HCL 4 MG/2ML IJ SOLN: 4.0000 mg | Freq: Three times a day (TID) | INTRAMUSCULAR | Status: AC | PRN

## 2013-06-24 MED ORDER — GABAPENTIN 300 MG PO CAPS: 300.0000 mg | ORAL_CAPSULE | Freq: Every day | ORAL | Status: DC

## 2013-06-24 MED ORDER — VANCOMYCIN HCL 10 G IV SOLR
1250.0000 mg | Freq: Two times a day (BID) | INTRAVENOUS | Status: DC
  Administered 2013-06-25: 1250 mg via INTRAVENOUS
  Filled 2013-06-24 (×2): qty 1250

## 2013-06-24 MED ORDER — SPIRONOLACTONE 25 MG PO TABS
25.0000 mg | ORAL_TABLET | Freq: Two times a day (BID) | ORAL | Status: DC
  Administered 2013-06-24 – 2013-07-02 (×16): 25 mg via ORAL
  Filled 2013-06-24 (×17): qty 1

## 2013-06-24 MED ORDER — INSULIN GLARGINE 100 UNIT/ML ~~LOC~~ SOLN
16.0000 [IU] | Freq: Every day | SUBCUTANEOUS | Status: DC
  Administered 2013-06-24: 16 [IU] via SUBCUTANEOUS
  Filled 2013-06-24 (×2): qty 0.16

## 2013-06-24 MED ORDER — LEVOFLOXACIN IN D5W 750 MG/150ML IV SOLN
750.0000 mg | INTRAVENOUS | Status: DC
  Administered 2013-06-25: 750 mg via INTRAVENOUS
  Filled 2013-06-24 (×2): qty 150

## 2013-06-24 MED ORDER — TORSEMIDE 100 MG PO TABS
100.0000 mg | ORAL_TABLET | Freq: Two times a day (BID) | ORAL | Status: DC
  Administered 2013-06-25 – 2013-07-02 (×15): 100 mg via ORAL
  Filled 2013-06-24 (×18): qty 1

## 2013-06-24 MED ORDER — OXYCODONE HCL 10 MG PO TABS: 5.0000 mg | ORAL_TABLET | Freq: Three times a day (TID) | ORAL | Status: DC | PRN

## 2013-06-24 MED ORDER — BUDESONIDE-FORMOTEROL FUMARATE 160-4.5 MCG/ACT IN AERO
2.0000 | INHALATION_SPRAY | Freq: Two times a day (BID) | RESPIRATORY_TRACT | Status: DC
  Administered 2013-06-24 – 2013-06-25 (×2): 2 via RESPIRATORY_TRACT
  Filled 2013-06-24: qty 6

## 2013-06-24 MED ORDER — FLUTICASONE PROPIONATE 50 MCG/ACT NA SUSP
2.0000 | Freq: Every day | NASAL | Status: DC
  Administered 2013-06-25 – 2013-07-02 (×8): 2 via NASAL
  Filled 2013-06-24 (×2): qty 16

## 2013-06-24 MED ORDER — ALBUTEROL (5 MG/ML) CONTINUOUS INHALATION SOLN
10.0000 mg/h | INHALATION_SOLUTION | RESPIRATORY_TRACT | Status: AC
  Administered 2013-06-24: 10 mg/h via RESPIRATORY_TRACT
  Filled 2013-06-24: qty 20

## 2013-06-24 MED ORDER — ASPIRIN EC 81 MG PO TBEC
81.0000 mg | DELAYED_RELEASE_TABLET | Freq: Every day | ORAL | Status: DC
  Administered 2013-06-24 – 2013-07-02 (×9): 81 mg via ORAL
  Filled 2013-06-24 (×9): qty 1

## 2013-06-24 MED ORDER — NICOTINE 21 MG/24HR TD PT24
21.0000 mg | MEDICATED_PATCH | Freq: Every day | TRANSDERMAL | Status: DC
  Administered 2013-06-24 – 2013-07-02 (×9): 21 mg via TRANSDERMAL
  Filled 2013-06-24 (×9): qty 1

## 2013-06-24 MED ORDER — INSULIN ASPART 100 UNIT/ML ~~LOC~~ SOLN
0.0000 [IU] | Freq: Every day | SUBCUTANEOUS | Status: DC
  Administered 2013-06-24: 3 [IU] via SUBCUTANEOUS
  Administered 2013-06-25: 2 [IU] via SUBCUTANEOUS
  Administered 2013-06-26 – 2013-06-27 (×2): 3 [IU] via SUBCUTANEOUS
  Administered 2013-06-28: 5 [IU] via SUBCUTANEOUS
  Administered 2013-06-29: 2 [IU] via SUBCUTANEOUS

## 2013-06-24 MED ORDER — POTASSIUM CHLORIDE CRYS ER 20 MEQ PO TBCR
40.0000 meq | EXTENDED_RELEASE_TABLET | Freq: Three times a day (TID) | ORAL | Status: DC
  Administered 2013-06-24 – 2013-06-25 (×2): 40 meq via ORAL
  Filled 2013-06-24 (×4): qty 2

## 2013-06-24 MED ORDER — PANTOPRAZOLE SODIUM 40 MG PO TBEC
40.0000 mg | DELAYED_RELEASE_TABLET | Freq: Two times a day (BID) | ORAL | Status: DC
  Administered 2013-06-24 – 2013-07-02 (×16): 40 mg via ORAL
  Filled 2013-06-24 (×15): qty 1

## 2013-06-24 MED ORDER — ALBUTEROL SULFATE (5 MG/ML) 0.5% IN NEBU: 2.5000 mg | INHALATION_SOLUTION | RESPIRATORY_TRACT | Status: DC | PRN

## 2013-06-24 MED ORDER — ENOXAPARIN SODIUM 40 MG/0.4ML ~~LOC~~ SOLN
40.0000 mg | SUBCUTANEOUS | Status: DC
  Administered 2013-06-24 – 2013-07-01 (×8): 40 mg via SUBCUTANEOUS
  Filled 2013-06-24 (×9): qty 0.4

## 2013-06-24 MED ORDER — MORPHINE SULFATE 2 MG/ML IJ SOLN
2.0000 mg | Freq: Once | INTRAMUSCULAR | Status: AC
  Administered 2013-06-24: 2 mg via INTRAVENOUS
  Filled 2013-06-24: qty 1

## 2013-06-24 MED ORDER — OXYCODONE HCL 5 MG PO TABS
10.0000 mg | ORAL_TABLET | Freq: Three times a day (TID) | ORAL | Status: DC
  Administered 2013-06-24 – 2013-07-02 (×23): 10 mg via ORAL
  Filled 2013-06-24 (×23): qty 2

## 2013-06-24 MED ORDER — MAGNESIUM SULFATE 40 MG/ML IJ SOLN
2.0000 g | INTRAMUSCULAR | Status: AC
  Administered 2013-06-24: 2 g via INTRAVENOUS
  Filled 2013-06-24: qty 50

## 2013-06-24 NOTE — Progress Notes (Signed)
ANTIBIOTIC CONSULT NOTE - INITIAL  Pharmacy Consult for vancomycin Indication: rule out pneumonia  Allergies  Allergen Reactions  . Ciprofloxacin     REACTION: nausea  . Latex     REACTION: rash, burning  . Sulfamethoxazole-Trimethoprim     Bactrim REACTION: Projectile Vomiting  . Azithromycin     REACTION: resistant  . Doxycycline     REACTION: restistant  . Metformin And Related Other (See Comments)    Severe diarrhea  . Metronidazole Nausea And Vomiting  . Simvastatin     Patient Measurements:   Adjusted Body Weight:   Vital Signs: Temp: 99.4 F (37.4 C) (09/30 1744) Temp src: Rectal (09/30 1744) BP: 118/95 mmHg (09/30 1800) Pulse Rate: 87 (09/30 1800) Intake/Output from previous day:   Intake/Output from this shift:    Labs:  Recent Labs  06/24/13 1617  WBC 8.3  HGB 16.0*  PLT 236  CREATININE 0.94   The CrCl is unknown because both a height and weight (above a minimum accepted value) are required for this calculation. No results found for this basename: VANCOTROUGH, VANCOPEAK, VANCORANDOM, GENTTROUGH, GENTPEAK, GENTRANDOM, TOBRATROUGH, TOBRAPEAK, TOBRARND, AMIKACINPEAK, AMIKACINTROU, AMIKACIN,  in the last 72 hours   Microbiology: No results found for this or any previous visit (from the past 720 hour(s)).  Medical History: Past Medical History  Diagnosis Date  . Sleep apnea     noncompliant w/ CPAP  . Hypertension   . Hyperlipidemia   . Chronic headache   . Fibromyalgia     daily narcotics  . Anxiety     hx chronic BZ use, stopped 07/2010  . Anemia   . Pulmonary sarcoidosis     unimpressive CT chest 2011  . Colonic polyp   . GERD (gastroesophageal reflux disease)   . ALLERGIC RHINITIS   . Asthma   . CHF (congestive heart failure)     Diastolic with fluid overload, May, 2012, LVEF 60%  . Morbid obesity   . Depression   . Panic attacks   . Diabetes mellitus   . COPD (chronic obstructive pulmonary disease)     on home O2, moderate  airflow obstruction, suspect d/t emphysema  . Obesity   . Elevated LFTs 09/2011  . Ovarian cyst   . Chronic back pain     Medications:  Anti-infectives   Start     Dose/Rate Route Frequency Ordered Stop   06/25/13 0800  vancomycin (VANCOCIN) 1,250 mg in sodium chloride 0.9 % 250 mL IVPB     1,250 mg 166.7 mL/hr over 90 Minutes Intravenous Every 12 hours 06/24/13 1817     06/24/13 1830  vancomycin (VANCOCIN) 2,500 mg in sodium chloride 0.9 % 500 mL IVPB     2,500 mg 250 mL/hr over 120 Minutes Intravenous  Once 06/24/13 1817     06/24/13 1730  levofloxacin (LEVAQUIN) IVPB 750 mg     750 mg 100 mL/hr over 90 Minutes Intravenous  Once 06/24/13 1727       Assessment: 47 yof with history of COPD presented to the ED with SOB. To start broad-spectrum coverage for possible PNA with COPD exacerbation. Pt is afebrile and WBC is WNL.   Goal of Therapy:  Vancomycin trough level 15-20 mcg/ml  Plan:  1. Vancomycin 2500mg  IV x 1 then 1250mg  IV Q12H 2. F/u renal fxn, C&S, clinical status and trough at [facility], Drake Leach 06/24/2013,6:18 PM

## 2013-06-24 NOTE — ED Notes (Signed)
Admitting MD at bedside.

## 2013-06-24 NOTE — Telephone Encounter (Signed)
Returning call says she was disconnected when she was talking to nurse can be reached at [phone].Raylene Everts

## 2013-06-24 NOTE — ED Notes (Signed)
Per EMS pt was recently treated for an URI, pt has been taking an antibiotic and prednisone, per EMS pt had three breathing txs at home and they did not help, pt was extremely SOB and wheezing upon arrival of EMS. Per EMS pt wear 3Ls 02 at home regularly. Upon arrival to pt's home, pt's 02 sats were 91% on 3L 02. EMS started an atrovent and albuterol breathing tx. Per EMS pt's 02 sats 98% upon arrival to department. Per EMS pt has a hx of COPD and CHF. Pt is A&Ox4.

## 2013-06-24 NOTE — ED Notes (Signed)
Pt is no longer sweating, pt states she still feels hot. Pt states she feels a little bit better.

## 2013-06-24 NOTE — H&P (Signed)
History and Physical       Hospital Admission Note Date: 06/24/2013  Patient name: [name] Medical record number: [id number] Date of birth: 31-May-1966 Age: 47 y.o. Gender: female PCP: Rene Paci, MD   Primary Pulmonologist: Dr. Marcelyn Bruins  Chief Complaint:  Shortness of breath with wheezing for last 2-3 days  HPI: Patient is a 47 year old female with history of chronic respiratory failure, on 3 L O2, history of COPD, pulmonary sarcoidosis, asthma, morbid obesity, obstructive sleep apnea, diabetes mellitus presented to ED with progressively worsening shortness of breath for last 3 days. Patient reports that her symptoms are week ago with cough and congestion. However, yesterday morning when she woke up, she had significant shortness of breath with wheezing. She called her pulmonologist yesterday who recommended her to come to the ER. Dr. Shelle Iron recommended her to stop smoking, called in omnicef antibiotics with prednisone taper. Patient reports that she started taking her antibiotics and prednisone last night with no relief. She used her albuterol nebulizer 3 times today. Also states her boyfriend and grand child is sick at home. States stopped smoking 2 days ago. In the ER, despite prolonged albuterol nebulizer treatment, Solu-Medrol, patient was still extremely short of breath, wheezing and winded, hospitalist service was requested for admission.  Review of Systems:  Constitutional: Denies fever, chills, diaphoresis, + chills with poor appetite and fatigue.  HEENT: Denies photophobia, eye pain, redness, hearing loss, ear pain, + congestion, sore throat, rhinorrhea, sneezing,  denies mouth sores, trouble swallowing, neck pain, neck stiffness and tinnitus.   Respiratory:  please see history of present illness  Cardiovascular: Denies chest pain, palpitations and leg swelling.  Gastrointestinal: Denies nausea, vomiting,  abdominal pain, diarrhea, constipation, blood in stool and abdominal distention.  Genitourinary: Denies dysuria, urgency, frequency, hematuria, flank pain and difficulty urinating.  Musculoskeletal: Denies myalgias, back pain, joint swelling, arthralgias and gait problem.  Skin: Denies pallor, rash and wound.  Neurological: Denies dizziness, seizures, syncope, weakness, light-headedness, numbness and headaches.  Hematological: Denies adenopathy. Easy bruising, personal or family bleeding history  Psychiatric/Behavioral: Denies suicidal ideation, mood changes, confusion, nervousness, sleep disturbance and agitation  Past Medical History: Past Medical History  Diagnosis Date  . Sleep apnea     noncompliant w/ CPAP  . Hypertension   . Hyperlipidemia   . Chronic headache   . Fibromyalgia     daily narcotics  . Anxiety     hx chronic BZ use, stopped 07/2010  . Anemia   . Pulmonary sarcoidosis     unimpressive CT chest 2011  . Colonic polyp   . GERD (gastroesophageal reflux disease)   . ALLERGIC RHINITIS   . Asthma   . CHF (congestive heart failure)     Diastolic with fluid overload, May, 2012, LVEF 60%  . Morbid obesity   . Depression   . Panic attacks   . Diabetes mellitus   . COPD (chronic obstructive pulmonary disease)     on home O2, moderate airflow obstruction, suspect d/t emphysema  . Obesity   . Elevated LFTs 09/2011  . Ovarian cyst   . Chronic back pain    Past Surgical History  Procedure Laterality Date  . Polypectomy  2011  . Lumbar microdiscectomy  07/06/2011    R L4-5, stern  . Back surgery    . Carpal tunnel release    . Steroid spinal injections    . Hysteroscopy w/d&c N/A 03/25/2013    Procedure: DILATATION AND CURETTAGE /HYSTEROSCOPY;  Surgeon: Liz [employer]iborne,  MD;  Location: [facility];  Service: Gynecology;  Laterality: N/A;  . Dilation and curettage of uterus      Medications: Prior to Admission medications   Medication Sig Start Date End Date Taking?  Authorizing Provider  albuterol (PROVENTIL HFA;VENTOLIN HFA) 108 (90 BASE) MCG/ACT inhaler Inhale 2 puffs into the lungs every 6 (six) hours as needed. For wheeze or shortness of breath 09/04/12   Richarda Overlie, MD  albuterol (PROVENTIL) (2.5 MG/3ML) 0.083% nebulizer solution USE 1 VIAL VIA NEBULIZER EVERY 6 HOURS AS NEEDED FOR WHEEZE OR SHORTNESS OF BREATH 06/06/13   Newt Lukes, MD  ALPRAZolam Prudy Feeler) 1 MG tablet TAKE 1 TABLET BY MOUTH TWICE DAILY AS NEEDED 02/03/13   Newt Lukes, MD  aspirin EC 81 MG tablet Take 1 tablet (81 mg total) by mouth daily. 06/06/13   Newt Lukes, MD  budesonide-formoterol (SYMBICORT) 160-4.5 MCG/ACT inhaler Inhale 2 puffs into the lungs 2 (two) times daily. 09/04/12   Richarda Overlie, MD  cefdinir (OMNICEF) 300 MG capsule Take 2 capsules (600 mg total) by mouth every morning. 06/23/13   Barbaraann Share, MD  cetirizine (ZYRTEC) 1 MG/ML syrup TAKE 2 TEASPOONFULS(10ML) BY MOUTH AT BEDTIME 05/07/13   Nicki Reaper, NP  esomeprazole (NEXIUM) 40 MG capsule Take 1 capsule (40 mg total) by mouth 2 (two) times daily. 01/23/13   Newt Lukes, MD  gabapentin (NEURONTIN) 300 MG capsule Take 1-2 capsules (300-600 mg total) by mouth at bedtime. 06/06/13   Newt Lukes, MD  glimepiride (AMARYL) 1 MG tablet Take 1 mg by mouth daily before breakfast.    Historical Provider, MD  LANTUS SOLOSTAR 100 UNIT/ML SOPN INJECT 16 UNITS INTO THE SKIN AT BEDTIME 03/28/13   Nicki Reaper, NP  mometasone (NASONEX) 50 MCG/ACT nasal spray Place 2 sprays into the nose daily. 06/06/13   Newt Lukes, MD  Oxycodone HCl 10 MG TABS Take 0.5-1 tablets (5-10 mg total) by mouth 3 (three) times daily as needed. 06/06/13   Newt Lukes, MD  potassium chloride SA (K-DUR,KLOR-CON) 20 MEQ tablet Take 2 tablets (40 mEq total) by mouth 3 (three) times daily. 01/23/13   Newt Lukes, MD  predniSONE (DELTASONE) 20 MG tablet Take 2 tabs x 2 days, then 1.5 x 2 days, then 1 x 2 days, then  stop. 06/23/13   Barbaraann Share, MD  sertraline (ZOLOFT) 50 MG tablet Take 1 tablet (50 mg total) by mouth daily. 04/08/13   Newt Lukes, MD  SPIRIVA HANDIHALER 18 MCG inhalation capsule INHALE CONTENTS OF 1 CAPSULE USING HANDIHALER ONCE DAILY 05/28/13   Barbaraann Share, MD  spironolactone (ALDACTONE) 25 MG tablet Take 25 mg by mouth 2 (two) times daily.    Historical Provider, MD  torsemide (DEMADEX) 100 MG tablet Take 100 mg by mouth 2 (two) times daily.    Historical Provider, MD    Allergies:   Allergies  Allergen Reactions  . Ciprofloxacin     REACTION: nausea  . Latex     REACTION: rash, burning  . Sulfamethoxazole-Trimethoprim     Bactrim REACTION: Projectile Vomiting  . Azithromycin     REACTION: resistant  . Doxycycline     REACTION: restistant  . Metformin And Related Other (See Comments)    Severe diarrhea  . Metronidazole Nausea And Vomiting  . Simvastatin     Social History:  reports that she has been smoking Cigarettes.  She has a 7.25 pack-year smoking history. She has never  used smokeless tobacco. She reports that  drinks alcohol. She reports that she uses illicit drugs (Cocaine and Marijuana).  Family History: Family History  Problem Relation Age of Onset  . Hypertension Mother   . Emphysema Father   . Hypertension Father   . Stomach cancer Father   . Allergies Brother   . Hypertension Brother   . Stomach cancer Brother   . Heart disease Mother   . Heart disease Father   . Heart disease Brother     died age 51 sudden death/MI    Physical Exam: Blood pressure 154/66, pulse 84, temperature 99.4 F (37.4 C), temperature source Rectal, resp. rate 22, last menstrual period 10/16/2012, SpO2 99.00%. General: Alert, awake, oriented x3, in moderate respiratory distress due to wheezing and congestion, able to complete sentences  HEENT: normocephalic, atraumatic, anicteric sclera, pink conjunctiva, pupils equal and reactive to light and accomodation,  oropharynx clear Neck: supple, no masses or lymphadenopathy, no goiter, no bruits  Heart: S1-S2 clear, rate and rhythm  Lungs: Diffuse wheezing bilaterally, tight  abdomen:  morbidly obese, Soft, nontender, nondistended, positive bowel sounds, no masses. Extremities: No clubbing, cyanosis or edema with positive pedal pulses. Neuro: Grossly intact, no focal neurological deficits, strength 5/5 upper and lower extremities bilaterally Psych: alert and oriented x 3, normal mood and affect Skin: no rashes or lesions, warm and dry   LABS on Admission:  Basic Metabolic Panel:  Recent Labs Lab 06/24/13 1617  NA 140  K 3.4*  CL 98  CO2 28  GLUCOSE 138*  BUN 11  CREATININE 0.94  CALCIUM 9.2   CBC:  Recent Labs Lab 06/24/13 1617  WBC 8.3  HGB 16.0*  HCT 46.2*  MCV 78.7  PLT 236   Cardiac Enzymes: No results found for this basename: CKTOTAL, CKMB, CKMBINDEX, TROPONINI,  in the last 168 hours BNP: No components found with this basename: POCBNP,  CBG: No results found for this basename: GLUCAP,  in the last 168 hours   Radiological Exams on Admission: Dg Chest Port 1 View  06/24/2013   CLINICAL DATA:  Cough, shortness of breath  EXAM: PORTABLE CHEST - 1 VIEW  COMPARISON:  01/05/2013  FINDINGS: Cardiomediastinal silhouette is stable. Again noted chronic interstitial prominence and scarring bilateral lower lobe. There is slight worsening in aeration with streaky airspace disease bilateral basilar suspicious for superimposed infiltrates. Follow-up to resolution is recommended after treatment.  IMPRESSION: Again noted chronic interstitial prominence and scarring bilateral lower lobe. There is slight worsening in aeration with streaky airspace disease bilateral basilar suspicious for superimposed infiltrates. Follow-up to resolution is recommended after treatment.   Electronically Signed   By: Natasha Mead   On: 06/24/2013 16:43    Assessment/Plan Principal Problem:   Acute-on-chronic  respiratory failure: Multifactorial likely secondary to COPD with acute exacerbation, community-acquired pneumonia. She has a baseline chronic respiratory failure with pulmonary sarcoidosis, obesity hypoventilation syndrome, obstructive sleep apnea, on chronically 3 liters O2 via [place]. - Admit to step down, placed on scheduled albuterol and Atrovent nebs q 4hours and q2hours PRN, scheduled IV Solu-Medrol, Symbicort, flutter valve - Placed on vancomycin and Levaquin, obtain urine legionella antigen, urine strep antigen, PRN morphine for respiratory distress.  - May need BiPAP or intubation if respiratory status deteriorates/fatigued   Active Problems: Nicotine abuse - Patient was counseled on nicotine cessation, place on nicotine patch    Morbid obesity - Patient was counseled for weight and diet control    HYPERTENSION - Continue Lasix and Demadex,  last echocardiogram was in 12 2013, EF 60-65%, RV SF was normal    GERD - cont PPI BID  Anxiety: - will give one dose of morphine 2mg   - on xanax at home, will place on IV ativan q 8hours  s needed    DVT prophylaxis: lovenox   CODE STATUS: FULL CODE   Further plan will depend as patient's clinical course evolves and further radiologic and laboratory data become available.   Time Spent on Admission: 1 HOUR  Hildagard Sobecki M.D. [facility] 06/24/2013, 5:59 PM Pager: [phone]  If 7PM-7AM, please contact night-coverage [email or web address] Password [email or web address]

## 2013-06-24 NOTE — Telephone Encounter (Signed)
[email or web address] for the pt.  

## 2013-06-24 NOTE — Telephone Encounter (Signed)
Called, spoke with pt.  Pt called in yesterday d/t being sick and was given the following recs:  Barbaraann Share, MD at 06/23/2013 5:43 PM   Status: Signed            Let pt know she has to completely stop smoking, or this will continue.  Call in omnicef 300mg , take 2 each am for 5 days  Prednisone taper 20mg , take 40 for 2 days, 30 for 2, 20 for 2 , then stop  Call us if not getting better, or go to ER if after hours.   ------  Pt states her symptoms are unchanged from yesterday.  She c/o a "rattly" cough.  She coughed up a small amount of thick, white mucus this morning.  She also c/o "laboring to breath," feels fatigued, feels hot, is sweating, and has chest tightness.  She felt like she was going to faint earlier today.  Pt took first dose of abx and pred yesterday.  She is using albuterol hfa and neb both with no relief.  Is also using o2 with no help.    COPD pt.  Spoke with KC.  Discussed above with him.  Per KC:  [name].  Called pt back.  Informed her KC recs she go to the ED for further evaluation and treatment and advised I rec she call 911 to be taken to the ED.  She verbalized understanding.

## 2013-06-24 NOTE — ED Provider Notes (Signed)
CSN: [id number]     Arrival date & time 06/24/13  1554 History   First MD Initiated Contact with Patient 06/24/13 1555     Chief Complaint  Patient presents with  . Shortness of Breath   (Consider location/radiation/quality/duration/timing/severity/associated sxs/prior Treatment) Patient is a 47 y.o. female presenting with shortness of breath. The history is provided by the patient and medical records. No language interpreter was used.  Shortness of Breath Associated symptoms: chest pain (with cough), cough, diaphoresis and wheezing   Associated symptoms: no abdominal pain, no fever, no headaches, no rash and no vomiting     [name] is a 47 y.o. female  with a hx of sleep apnea, hypertension, fibromyalgia, pulmonary sarcoidosis, asthma, CHF, obesity, diabetes, COPD presents to the Emergency Department complaining of gradual, persistent, progressively worsening shortness of breath beginning 3 days ago. The patient states she has been smoking cigarettes on a somewhat regular basis and thinks this contributed to her shortness of breath. She saw her pulmonologist yesterday who prescribed Levaquin by mouth and prednisone 40 mg by mouth. She states she took them yesterday and today with increasing symptoms and no relief. She's been using her albuterol inhaler as well without relief. Patient reports her shortness of breath became so bad today that when she called the pulmonologist he recommended the ED evaluation via EMS. Patient with 3 L of oxygen at home regularly and this was not helping with her breathing. Associated symptoms include diaphoresis.  Exertion makes her symptoms worse..  Pt denies fever, chills, headache, neck pain, abdominal pain, nausea, vomiting, diarrhea, dizziness, syncope, dysuria, hematuria. Patient reports she does not run a fever when she has pneumonia.   Past Medical History  Diagnosis Date  . Sleep apnea     noncompliant w/ CPAP  . Hypertension   . Hyperlipidemia    . Chronic headache   . Fibromyalgia     daily narcotics  . Anxiety     hx chronic BZ use, stopped 07/2010  . Anemia   . Pulmonary sarcoidosis     unimpressive CT chest 2011  . Colonic polyp   . GERD (gastroesophageal reflux disease)   . ALLERGIC RHINITIS   . Asthma   . CHF (congestive heart failure)     Diastolic with fluid overload, May, 2012, LVEF 60%  . Morbid obesity   . Depression   . Panic attacks   . Diabetes mellitus   . COPD (chronic obstructive pulmonary disease)     on home O2, moderate airflow obstruction, suspect d/t emphysema  . Obesity   . Elevated LFTs 09/2011  . Ovarian cyst   . Chronic back pain    Past Surgical History  Procedure Laterality Date  . Polypectomy  2011  . Lumbar microdiscectomy  07/06/2011    R L4-5, stern  . Back surgery    . Carpal tunnel release    . Steroid spinal injections    . Hysteroscopy w/d&c N/A 03/25/2013    Procedure: DILATATION AND CURETTAGE /HYSTEROSCOPY;  Surgeon: Lavina Hamman, MD;  Location: [facility];  Service: Gynecology;  Laterality: N/A;  . Dilation and curettage of uterus     Family History  Problem Relation Age of Onset  . Hypertension Mother   . Emphysema Father   . Hypertension Father   . Stomach cancer Father   . Allergies Brother   . Hypertension Brother   . Stomach cancer Brother   . Heart disease Mother   . Heart disease Father   .  Heart disease Brother     died age 26 sudden death/MI   History  Substance Use Topics  . Smoking status: Light Tobacco Smoker -- 0.25 packs/day for 29 years    Types: Cigarettes    Last Attempt to Quit: 01/20/2013  . Smokeless tobacco: Never Used     Comment: Pt states she quit smoking 2-3 weeks ago. Resumed smoking September 2013 --2-3 CIGS DAILY AND E-CIG  . Alcohol Use: Yes     Comment: occasionally   OB History   Grav Para Term Preterm Abortions TAB SAB Ect Mult Living                 Review of Systems  Constitutional: Positive for diaphoresis and fatigue.  Negative for fever, appetite change and unexpected weight change.  HENT: Negative for mouth sores and neck stiffness.   Eyes: Negative for visual disturbance.  Respiratory: Positive for cough, chest tightness, shortness of breath and wheezing.   Cardiovascular: Positive for chest pain (with cough).  Gastrointestinal: Negative for nausea, vomiting, abdominal pain, diarrhea and constipation.  Endocrine: Negative for polydipsia, polyphagia and polyuria.  Genitourinary: Negative for dysuria, urgency, frequency and hematuria.  Musculoskeletal: Negative for back pain.  Skin: Negative for rash.  Allergic/Immunologic: Negative for immunocompromised state.  Neurological: Negative for syncope, light-headedness and headaches.  Hematological: Does not bruise/bleed easily.  Psychiatric/Behavioral: Negative for sleep disturbance. The patient is not nervous/anxious.     Allergies  Ciprofloxacin; Latex; Sulfamethoxazole-trimethoprim; Azithromycin; Doxycycline; Metformin and related; Metronidazole; and Simvastatin  Home Medications   Current Outpatient Rx  Name  Route  Sig  Dispense  Refill  . albuterol (PROVENTIL HFA;VENTOLIN HFA) 108 (90 BASE) MCG/ACT inhaler   Inhalation   Inhale 2 puffs into the lungs every 6 (six) hours as needed. For wheeze or shortness of breath   1 Inhaler   0   . albuterol (PROVENTIL) (2.5 MG/3ML) 0.083% nebulizer solution      USE 1 VIAL VIA NEBULIZER EVERY 6 HOURS AS NEEDED FOR WHEEZE OR SHORTNESS OF BREATH   75 mL   5   . ALPRAZolam (XANAX) 1 MG tablet      TAKE 1 TABLET BY MOUTH TWICE DAILY AS NEEDED   60 tablet   5   . aspirin EC 81 MG tablet   Oral   Take 1 tablet (81 mg total) by mouth daily.   150 tablet   2   . budesonide-formoterol (SYMBICORT) 160-4.5 MCG/ACT inhaler   Inhalation   Inhale 2 puffs into the lungs 2 (two) times daily.   1 Inhaler   2   . cefdinir (OMNICEF) 300 MG capsule   Oral   Take 2 capsules (600 mg total) by mouth every  morning.   10 capsule   0   . cetirizine (ZYRTEC) 1 MG/ML syrup      TAKE 2 TEASPOONFULS(10ML) BY MOUTH AT BEDTIME   300 mL   0   . esomeprazole (NEXIUM) 40 MG capsule   Oral   Take 1 capsule (40 mg total) by mouth 2 (two) times daily.   60 capsule   5   . gabapentin (NEURONTIN) 300 MG capsule   Oral   Take 1-2 capsules (300-600 mg total) by mouth at bedtime.   60 capsule   3   . glimepiride (AMARYL) 1 MG tablet   Oral   Take 1 mg by mouth daily before breakfast.         . LANTUS SOLOSTAR 100 UNIT/ML SOPN  INJECT 16 UNITS INTO THE SKIN AT BEDTIME   15 mL   0   . mometasone (NASONEX) 50 MCG/ACT nasal spray   Nasal   Place 2 sprays into the nose daily.   17 g   5   . Oxycodone HCl 10 MG TABS   Oral   Take 0.5-1 tablets (5-10 mg total) by mouth 3 (three) times daily as needed.   90 tablet   0     Fill only once every 30 days   . potassium chloride SA (K-DUR,KLOR-CON) 20 MEQ tablet   Oral   Take 2 tablets (40 mEq total) by mouth 3 (three) times daily.   180 tablet   3   . predniSONE (DELTASONE) 20 MG tablet      Take 2 tabs x 2 days, then 1.5 x 2 days, then 1 x 2 days, then stop.   9 tablet   0   . sertraline (ZOLOFT) 50 MG tablet   Oral   Take 1 tablet (50 mg total) by mouth daily.   30 tablet   3   . SPIRIVA HANDIHALER 18 MCG inhalation capsule      INHALE CONTENTS OF 1 CAPSULE USING HANDIHALER ONCE DAILY   30 capsule   6   . spironolactone (ALDACTONE) 25 MG tablet   Oral   Take 25 mg by mouth 2 (two) times daily.         Marland Kitchen torsemide (DEMADEX) 100 MG tablet   Oral   Take 100 mg by mouth 2 (two) times daily.          BP 154/66  Pulse 84  Temp(Src) 98.7 F (37.1 C) (Oral)  Resp 22  SpO2 99%  LMP 10/16/2012 Physical Exam  Nursing note and vitals reviewed. Constitutional: She is oriented to person, place, and time. She appears well-developed and well-nourished. She appears distressed.  Awake, alert, diaphoretic  HENT:   Head: Normocephalic and atraumatic.  Mouth/Throat: Oropharynx is clear and moist. No oropharyngeal exudate.  Eyes: Conjunctivae are normal. Pupils are equal, round, and reactive to light. No scleral icterus.  Neck: Normal range of motion. Neck supple.  Cardiovascular: Normal rate, regular rhythm, normal heart sounds and intact distal pulses.   No murmur heard. Pulmonary/Chest: Accessory muscle usage present. Tachypnea noted. She is in respiratory distress. She has decreased breath sounds. She has wheezes. She has rhonchi. She has no rales. She exhibits no tenderness and no bony tenderness.  Patient in respiratory distress with accessory muscle usage and tachypneic. Decreased breath sounds throughout with wheezes noted on the right and almost no air movement on the left.  Abdominal: Soft. Bowel sounds are normal. She exhibits no mass. There is no tenderness. There is no rebound and no guarding.  Musculoskeletal: Normal range of motion. She exhibits no edema and no tenderness.  Lymphadenopathy:    She has no cervical adenopathy.  Neurological: She is alert and oriented to person, place, and time. She exhibits normal muscle tone. Coordination normal.  Speech is clear and goal oriented Moves extremities without ataxia  Skin: Skin is warm. No rash noted. She is diaphoretic. No erythema.  Psychiatric: She has a normal mood and affect. Her behavior is normal.    ED Course  Procedures (including critical care time) Labs Review Labs Reviewed  CBC - Abnormal; Notable for the following:    RBC 5.87 (*)    Hemoglobin 16.0 (*)    HCT 46.2 (*)    RDW 16.0 (*)  All other components within normal limits  BASIC METABOLIC PANEL - Abnormal; Notable for the following:    Potassium 3.4 (*)    Glucose, Bld 138 (*)    GFR calc non Af Amer 72 (*)    GFR calc Af Amer 83 (*)    All other components within normal limits  PRO B NATRIURETIC PEPTIDE   Imaging Review Dg Chest Port 1 View  06/24/2013    CLINICAL DATA:  Cough, shortness of breath  EXAM: PORTABLE CHEST - 1 VIEW  COMPARISON:  01/05/2013  FINDINGS: Cardiomediastinal silhouette is stable. Again noted chronic interstitial prominence and scarring bilateral lower lobe. There is slight worsening in aeration with streaky airspace disease bilateral basilar suspicious for superimposed infiltrates. Follow-up to resolution is recommended after treatment.  IMPRESSION: Again noted chronic interstitial prominence and scarring bilateral lower lobe. There is slight worsening in aeration with streaky airspace disease bilateral basilar suspicious for superimposed infiltrates. Follow-up to resolution is recommended after treatment.   Electronically Signed   By: Natasha Mead   On: 06/24/2013 16:43   ECG:  Date: 06/24/2013  Rate: 85  Rhythm: normal sinus rhythm  QRS Axis: normal  Intervals: normal  ST/T Wave abnormalities: normal  Conduction Disutrbances:none  Narrative Interpretation: Nonischemic ECG, unchanged from 05/08/2013 and 03/21/2013  Old EKG Reviewed: unchanged   MDM   1. COPD exacerbation   2. Community acquired pneumonia   3. Chest tightness   4. Tobacco abuse   5. Diabetes type 2, uncontrolled   6. Morbid obesity   7. HYPERTENSION    Leory Plowman presents with COPD exacerbation.  Patient with significantly decreased breath sounds on arrival and respiratory distress as well as associated diaphoresis. Patient given continuous albuterol neb, Solu-Medrol and magnesium IV. Will reassess, but plan for admission.   5:34 PM Patient with continued respiratory distress with stable vital signs after magnesium administration. Chest x-ray with slight worsening in aeration with streaky airspace disease bilateral bibasilar suspicious for possible pneumonia. Will begin Levaquin IV.  I personally reviewed the imaging tests through PACS system.  I reviewed available ER/hospitalization records through the EMR.  Patient's last hospitalization was  April 2014 therefore I believe this is a community-acquired pneumonia as opposed to healthcare acquired.  Patient to be admitted to [facility] step down unit for further evaluation and treatment. Patient vital signs remained stable and she remains afebrile at this time.  BP 154/66  Pulse 84  Temp(Src) 98.7 F (37.1 C) (Oral)  Resp 22  SpO2 99%  LMP 10/16/2012     Dierdre Forth, PA-C 06/24/13 1740  Brecken Dewoody, PA-C 06/24/13 1834

## 2013-06-24 NOTE — ED Notes (Signed)
Pt was prescribed prednisone and cefdinir yesterday for an URI, pt states she has not been on the medications for a whole 24 hours yet.

## 2013-06-24 NOTE — ED Notes (Signed)
Pt incontinent, pt cleaned up and linen's changed.

## 2013-06-25 LAB — URINALYSIS, ROUTINE W REFLEX MICROSCOPIC
Hgb urine dipstick: NEGATIVE
Ketones, ur: NEGATIVE mg/dL
Nitrite: NEGATIVE
Protein, ur: NEGATIVE mg/dL
Specific Gravity, Urine: 1.008 (ref 1.005–1.030)
Urobilinogen, UA: 0.2 mg/dL (ref 0.0–1.0)

## 2013-06-25 LAB — BASIC METABOLIC PANEL
CO2: 23 mEq/L (ref 19–32)
GFR calc Af Amer: 84 mL/min — ABNORMAL LOW (ref >90)
GFR calc non Af Amer: 73 mL/min — ABNORMAL LOW (ref >90)
Potassium: 4.2 mEq/L (ref 3.5–5.1)
Sodium: 134 mEq/L — ABNORMAL LOW (ref 135–145)

## 2013-06-25 LAB — CBC
Hemoglobin: 13.6 g/dL (ref 12.0–15.0)
MCHC: 33.4 g/dL (ref 30.0–36.0)
RBC: 5.19 MIL/uL — ABNORMAL HIGH (ref 3.87–5.11)

## 2013-06-25 LAB — GLUCOSE, CAPILLARY
Glucose-Capillary: 290 mg/dL — ABNORMAL HIGH (ref 70–99)
Glucose-Capillary: 222 mg/dL — ABNORMAL HIGH (ref 70–99)

## 2013-06-25 LAB — STREP PNEUMONIAE URINARY ANTIGEN: Strep Pneumo Urinary Antigen: NEGATIVE

## 2013-06-25 LAB — HEMOGLOBIN A1C: Mean Plasma Glucose: 131 mg/dL — ABNORMAL HIGH (ref <117)

## 2013-06-25 MED ORDER — ALBUTEROL SULFATE (5 MG/ML) 0.5% IN NEBU
INHALATION_SOLUTION | RESPIRATORY_TRACT | Status: AC
  Filled 2013-06-25: qty 0.5

## 2013-06-25 MED ORDER — IPRATROPIUM BROMIDE 0.02 % IN SOLN
RESPIRATORY_TRACT | Status: AC
  Filled 2013-06-25: qty 2.5

## 2013-06-25 MED ORDER — WHITE PETROLATUM GEL
Status: AC
  Administered 2013-06-25: 11:00:00
  Filled 2013-06-25: qty 5

## 2013-06-25 MED ORDER — MORPHINE SULFATE 2 MG/ML IJ SOLN
2.0000 mg | INTRAMUSCULAR | Status: DC | PRN
  Administered 2013-06-25 – 2013-06-30 (×15): 2 mg via INTRAVENOUS
  Filled 2013-06-25 (×15): qty 1

## 2013-06-25 MED ORDER — POTASSIUM CHLORIDE CRYS ER 20 MEQ PO TBCR
40.0000 meq | EXTENDED_RELEASE_TABLET | Freq: Every day | ORAL | Status: DC
  Administered 2013-06-26 – 2013-06-27 (×2): 40 meq via ORAL
  Filled 2013-06-25 (×2): qty 2

## 2013-06-25 MED ORDER — BUDESONIDE 0.25 MG/2ML IN SUSP
0.2500 mg | Freq: Two times a day (BID) | RESPIRATORY_TRACT | Status: DC
  Administered 2013-06-26 – 2013-07-02 (×12): 0.25 mg via RESPIRATORY_TRACT
  Filled 2013-06-25 (×17): qty 2

## 2013-06-25 MED ORDER — ALBUTEROL SULFATE (5 MG/ML) 0.5% IN NEBU
2.5000 mg | INHALATION_SOLUTION | Freq: Four times a day (QID) | RESPIRATORY_TRACT | Status: DC
  Administered 2013-06-25 – 2013-07-02 (×27): 2.5 mg via RESPIRATORY_TRACT
  Filled 2013-06-25 (×27): qty 0.5

## 2013-06-25 MED ORDER — INSULIN GLARGINE 100 UNIT/ML ~~LOC~~ SOLN
20.0000 [IU] | Freq: Every day | SUBCUTANEOUS | Status: DC
  Administered 2013-06-25 – 2013-07-01 (×7): 20 [IU] via SUBCUTANEOUS
  Filled 2013-06-25 (×8): qty 0.2

## 2013-06-25 MED ORDER — IPRATROPIUM BROMIDE 0.02 % IN SOLN
0.5000 mg | Freq: Four times a day (QID) | RESPIRATORY_TRACT | Status: DC
  Administered 2013-06-25 – 2013-07-02 (×27): 0.5 mg via RESPIRATORY_TRACT
  Filled 2013-06-25 (×27): qty 2.5

## 2013-06-25 MED ORDER — INSULIN ASPART 100 UNIT/ML ~~LOC~~ SOLN
3.0000 [IU] | Freq: Three times a day (TID) | SUBCUTANEOUS | Status: DC
  Administered 2013-06-25 – 2013-06-27 (×6): 3 [IU] via SUBCUTANEOUS

## 2013-06-25 NOTE — Progress Notes (Signed)
[facility] Progress Note [place] TEAM 1 - Stepdown/ICU TEAM   [name] ZOX:[id number] DOB: [birth year] DOA: 06/24/2013 PCP: Rene Paci, MD  Admit HPI / Brief Narrative: 47 year old female with history of chronic hypoxic respiratory failure on 3 L O2, history of COPD, pulmonary sarcoidosis, asthma, morbid obesity, obstructive sleep apnea, and diabetes mellitus who presented to ED with progressively worsening shortness of breath for 3 days. Patient reported that her symptoms began a week prior with cough and congestion.  The day before her admit when she woke up, she had significant shortness of breath with wheezing. She called her Pulmonologist who advised her to come to the ER, and also apparently called in omnicef with prednisone taper. Patient reported that she was taking her antibiotics and prednisone with no relief. Stated she  stopped smoking 2 days prior.  In the ER, despite prolonged albuterol nebulizer treatment and Solu-Medrol the patient remained extremely short of breath.  Assessment/Plan:  Acute on chronic hypoxic resp failure Slow to improve - cont maximal med tx - if not improved by AM will consider Pulmonary consult (pt followed by Dr. Shelle Iron)  Acute exacerbation COPD on home O2 as noted above   Reported hx of Pulmonary sarcoidosis  Tobacco abuse  Counseled pt extensively on absolute need to stop smoking - she reports understanding and desire to do so  Sleep apnea Cont home CPAP regimen   DM CBG poorly controlled on steroids - adjust tx and follow   HTN Well controlled at present - adjust tx plan and follow   Fibromyalgia on chronic narcotics Reports no complaints of pain today   GERD On PPI  Obesity  Code Status: FULL Family Communication: no family present at time of exam Disposition Plan: SDU  Consultants: none  Procedures: none  Antibiotics: Levaquin 9/30 >> Vancomycin 9/30 >> 10/01  DVT  prophylaxis: lovenox  HPI/Subjective: Pt continues to report signif sob with wheezing.  She denies f/c, n/v, abdom pain, or chest pain.   Objective: Blood pressure 134/71, pulse 86, temperature 97.6 F (36.4 C), temperature source Oral, resp. rate 18, height 5' 8.5" (1.74 m), weight 132.2 kg (291 lb 7.2 oz), last menstrual period 10/16/2012, SpO2 93.00%.  Intake/Output Summary (Last 24 hours) at 06/25/13 1401 Last data filed at 06/25/13 1200  Gross per 24 hour  Intake   1590 ml  Output   1525 ml  Net     65 ml    Exam: General: modest resp distress with a prolonged exp phase  Lungs: diffuse wheeze - no focal crackles  Cardiovascular: Regular rate and rhythm without murmur gallop or rub normal S1 and S2 Abdomen: Nontender, nondistended, soft, bowel sounds positive, no rebound, no ascites, no appreciable mass Extremities: No significant cyanosis, clubbing, or edema bilateral lower extremities  Data Reviewed: Basic Metabolic Panel:  Recent Labs Lab 06/24/13 1617 06/25/13 0525  NA 140 134*  K 3.4* 4.2  CL 98 98  CO2 28 23  GLUCOSE 138* 198*  BUN 11 15  CREATININE 0.94 0.93  CALCIUM 9.2 8.6   Liver Function Tests: No results found for this basename: AST, ALT, ALKPHOS, BILITOT, PROT, ALBUMIN,  in the last 168 hours  CBC:  Recent Labs Lab 06/24/13 1617 06/25/13 0525  WBC 8.3 7.1  HGB 16.0* 13.6  HCT 46.2* 40.7  MCV 78.7 78.4  PLT 236 208   CBG:  Recent Labs Lab 06/24/13 2106 06/25/13 0809 06/25/13 1114  GLUCAP 290* 203* 202*    Recent Results (  from the past 240 hour(s))  MRSA PCR SCREENING     Status: None   Collection Time    06/24/13  6:56 PM      Result Value Range Status   MRSA by PCR NEGATIVE  NEGATIVE Final   Comment:            The GeneXpert MRSA Assay (FDA     approved for NASAL specimens     only), is one component of a     comprehensive MRSA colonization     surveillance program. It is not     intended to diagnose MRSA     infection  nor to guide or     monitor treatment for     MRSA infections.     Studies:  Recent x-ray studies have been reviewed in detail by the Attending Physician  Scheduled Meds:  Scheduled Meds: . albuterol  2.5 mg Nebulization Q4H   And  . ipratropium  0.5 mg Nebulization Q4H  . aspirin EC  81 mg Oral Daily  . budesonide-formoterol  2 puff Inhalation BID  . enoxaparin (LOVENOX) injection  40 mg Subcutaneous Q24H  . fluticasone  2 spray Each Nare Daily  . insulin aspart  0-15 Units Subcutaneous TID WC  . insulin aspart  0-5 Units Subcutaneous QHS  . insulin glargine  16 Units Subcutaneous QHS  . levofloxacin (LEVAQUIN) IV  750 mg Intravenous Q24H  . methylPREDNISolone (SOLU-MEDROL) injection  60 mg Intravenous Q6H  . nicotine  21 mg Transdermal Daily  . oxyCODONE  10 mg Oral TID  . pantoprazole  40 mg Oral BID  . potassium chloride SA  40 mEq Oral TID  . sertraline  50 mg Oral Daily  . spironolactone  25 mg Oral BID  . torsemide  100 mg Oral BID  . vancomycin  1,250 mg Intravenous Q12H    Time spent on care of this patient: 35 mins   [facility]  [facility] Office  [phone] Pager - Text Page per Loretha Stapler as per below:  On-Call/Text Page:      [email or web address]      password [email or web address]  If 7PM-7AM, please contact night-coverage [email or web address] Password [email or web address] 06/25/2013, 2:01 PM   LOS: 1 day

## 2013-06-25 NOTE — Progress Notes (Signed)
PHARMACY - RENAL ADJUSTMENT OF ANTIBIOTICS  On Levaquin for CAP. Renal function stable. Continue Levaquin 750 mg IV q24h Pharmacy signing off  [employer] [employer], [address].D., BCPS Clinical Pharmacist Pager: [phone] 06/25/2013 3:07 PM

## 2013-06-25 NOTE — Progress Notes (Deleted)
Placed pt. On CPAP auto titrate (Min: 6, Max: 15) via FFM (what pt. Wears at home.) Pt. Is tolerating CPAP well at this time without any complications.

## 2013-06-25 NOTE — Progress Notes (Signed)
Inpatient Diabetes Program Recommendations  AACE/ADA: New Consensus Statement on Inpatient Glycemic Control (2013)  Target Ranges:  Prepandial:   less than 140 mg/dL      Peak postprandial:   less than 180 mg/dL (1-2 hours)      Critically ill patients:  140 - 180 mg/dL  Results for CHRISTIANNA, BELMONTE (MRN [id number]) as of 06/25/2013 09:24  Ref. Range 06/24/2013 21:06 06/25/2013 08:09  Glucose-Capillary Latest Range: 70-99 mg/dL 409 (H) 811 (H)   Inpatient Diabetes Program Recommendations Correction (SSI): may need resistant scale during steroid therapy Thank you  Piedad Climes BSN, RN,CDE Inpatient Diabetes Coordinator [phone] (team pager)

## 2013-06-25 NOTE — Telephone Encounter (Signed)
i will go see her either thurs or Friday am.

## 2013-06-25 NOTE — Telephone Encounter (Signed)
Pt was admitted to [facility] last night for breathing.  Pt is in 2 Central Room 17 and wants to know if Dr Shelle Iron will be coming to see her.  Please advise.

## 2013-06-25 NOTE — Telephone Encounter (Signed)
LMTCB x 1 

## 2013-06-25 NOTE — Progress Notes (Signed)
Set pt.'s CPAP up at the bedside on 11cm H2O per pt. Comfort via FFM (what pt. Wears at home.) Pt. Stated that she wold place herself on CPAP before going to bed. Pt. Was made aware to call RT/RN to bled in O2.

## 2013-06-26 DIAGNOSIS — E662 Morbid (severe) obesity with alveolar hypoventilation: Secondary | ICD-10-CM

## 2013-06-26 LAB — GLUCOSE, CAPILLARY
Glucose-Capillary: 188 mg/dL — ABNORMAL HIGH (ref 70–99)
Glucose-Capillary: 148 mg/dL — ABNORMAL HIGH (ref 70–99)
Glucose-Capillary: 159 mg/dL — ABNORMAL HIGH (ref 70–99)

## 2013-06-26 LAB — LEGIONELLA ANTIGEN, URINE: Legionella Antigen, Urine: NEGATIVE

## 2013-06-26 MED ORDER — CHLORHEXIDINE GLUCONATE 0.12 % MT SOLN
15.0000 mL | Freq: Two times a day (BID) | OROMUCOSAL | Status: DC
  Administered 2013-06-26 (×2): 15 mL via OROMUCOSAL
  Filled 2013-06-26 (×5): qty 15

## 2013-06-26 MED ORDER — ALBUTEROL SULFATE HFA 108 (90 BASE) MCG/ACT IN AERS
2.0000 | INHALATION_SPRAY | RESPIRATORY_TRACT | Status: DC | PRN
  Filled 2013-06-26: qty 6.7

## 2013-06-26 MED ORDER — IPRATROPIUM BROMIDE 0.02 % IN SOLN: 0.5000 mg | Freq: Four times a day (QID) | RESPIRATORY_TRACT | Status: DC

## 2013-06-26 MED ORDER — LEVOFLOXACIN 750 MG PO TABS
750.0000 mg | ORAL_TABLET | Freq: Every day | ORAL | Status: DC
  Administered 2013-06-26 – 2013-06-30 (×5): 750 mg via ORAL
  Filled 2013-06-26 (×6): qty 1

## 2013-06-26 MED ORDER — DM-GUAIFENESIN ER 30-600 MG PO TB12
1.0000 | ORAL_TABLET | Freq: Two times a day (BID) | ORAL | Status: DC
  Administered 2013-06-26 – 2013-07-02 (×12): 1 via ORAL
  Filled 2013-06-26 (×14): qty 1

## 2013-06-26 MED ORDER — BIOTENE DRY MOUTH MT LIQD
15.0000 mL | Freq: Two times a day (BID) | OROMUCOSAL | Status: DC
  Administered 2013-06-26 – 2013-07-02 (×11): 15 mL via OROMUCOSAL

## 2013-06-26 NOTE — Telephone Encounter (Signed)
I spoke with pt and is aware. Nothing further needed 

## 2013-06-26 NOTE — Progress Notes (Addendum)
[facility] Progress Note [place] TEAM 1 - Stepdown/ICU TEAM   [name]:[id number] DOB: 12-Apr-1966 DOA: 06/24/2013 PCP: Rene Paci, MD  Admit HPI / Brief Narrative: 47 year old female with history of chronic hypoxic respiratory failure on 3 L O2, history of COPD, pulmonary sarcoidosis, asthma, morbid obesity, obstructive sleep apnea, and diabetes mellitus who presented to ED with progressively worsening shortness of breath for 3 days. Patient reported that her symptoms began a week prior with cough and congestion.  The day before her admit when she woke up, she had significant shortness of breath with wheezing. She called her Pulmonologist who advised her to come to the ER, and also apparently called in omnicef with prednisone taper. Patient reported that she was taking her antibiotics and prednisone with no relief. Stated she  stopped smoking 2 days prior.  In the ER, despite prolonged albuterol nebulizer treatment and Solu-Medrol the patient remained extremely short of breath.  Assessment/Plan:  Acute on chronic hypoxic resp failure/ Possible CAP Slow to improve - cont maximal med tx -she has spoken with Dr Shelle Iron who stated he will try to see her today-  - cont Levaquin - add Atrovent, flutter valve, Mucinex  Acute exacerbation COPD on home O2 as noted above   Reported hx of Pulmonary sarcoidosis  Tobacco abuse  Dr Dolphus Jenny counseled pt extensively on absolute need to stop smoking - she reports understanding and desire to do so  Sleep apnea Cont home CPAP regimen   DM CBG poorly controlled on steroids - adjust tx and follow   HTN Well controlled at present - adjust tx plan and follow   Fibromyalgia on chronic narcotics Reports no complaints of pain today   GERD On PPI  Obesity  Code Status: FULL Family Communication: no family present at time of exam Disposition Plan: SDU  Consultants: none  Procedures: none  Antibiotics: Levaquin 9/30  >> Vancomycin 9/30 >> 10/01  DVT prophylaxis: lovenox  HPI/Subjective: Pt continues to have a significant cough and states she can barely take 2 steps to use the commode - she becomes hypoxic down to the 80s.   Objective: Blood pressure 149/119, pulse 80, temperature 98.3 F (36.8 C), temperature source Oral, resp. rate 26, height 5' 8.5" (1.74 m), weight 133 kg (293 lb 3.4 oz), last menstrual period 10/16/2012, SpO2 95.00%.  Intake/Output Summary (Last 24 hours) at 06/26/13 1815 Last data filed at 06/26/13 1700  Gross per 24 hour  Intake    540 ml  Output   4950 ml  Net  -4410 ml    Exam: General: modest resp distress with a prolonged exp phase  Lungs: diffuse wheeze - no focal crackles  Cardiovascular: Regular rate and rhythm without murmur gallop or rub normal S1 and S2 Abdomen: Nontender, nondistended, soft, bowel sounds positive, no rebound, no ascites, no appreciable mass Extremities: No significant cyanosis, clubbing, or edema bilateral lower extremities  Data Reviewed: Basic Metabolic Panel:  Recent Labs Lab 06/24/13 1617 06/25/13 0525  NA 140 134*  K 3.4* 4.2  CL 98 98  CO2 28 23  GLUCOSE 138* 198*  BUN 11 15  CREATININE 0.94 0.93  CALCIUM 9.2 8.6   Liver Function Tests: No results found for this basename: AST, ALT, ALKPHOS, BILITOT, PROT, ALBUMIN,  in the last 168 hours  CBC:  Recent Labs Lab 06/24/13 1617 06/25/13 0525  WBC 8.3 7.1  HGB 16.0* 13.6  HCT 46.2* 40.7  MCV 78.7 78.4  PLT 236 208  CBG:  Recent Labs Lab 06/25/13 1713 06/25/13 2100 06/26/13 0807 06/26/13 1151 06/26/13 1708  GLUCAP 186* 222* 188* 148* 159*    Recent Results (from the past 240 hour(s))  MRSA PCR SCREENING     Status: None   Collection Time    06/24/13  6:56 PM      Result Value Range Status   MRSA by PCR NEGATIVE  NEGATIVE Final   Comment:            The GeneXpert MRSA Assay (FDA     approved for NASAL specimens     only), is one component of a      comprehensive MRSA colonization     surveillance program. It is not     intended to diagnose MRSA     infection nor to guide or     monitor treatment for     MRSA infections.  CULTURE, BLOOD (ROUTINE X 2)     Status: None   Collection Time    06/25/13 12:40 AM      Result Value Range Status   Specimen Description BLOOD LEFT ARM   Final   Special Requests BOTTLES DRAWN AEROBIC ONLY 10CC   Final   Culture  Setup Time     Final   Value: 06/25/2013 04:39     Performed at [facility]   Culture     Final   Value:        BLOOD CULTURE RECEIVED NO GROWTH TO DATE CULTURE WILL BE HELD FOR 5 DAYS BEFORE ISSUING A FINAL NEGATIVE REPORT     Performed at [facility]   Report Status PENDING   Incomplete  CULTURE, BLOOD (ROUTINE X 2)     Status: None   Collection Time    06/25/13  1:12 AM      Result Value Range Status   Specimen Description Blood   Final   Special Requests NONE   Final   Culture  Setup Time     Final   Value: 06/25/2013 04:39     Performed at [facility]   Culture     Final   Value:        BLOOD CULTURE RECEIVED NO GROWTH TO DATE CULTURE WILL BE HELD FOR 5 DAYS BEFORE ISSUING A FINAL NEGATIVE REPORT     Performed at [facility]   Report Status PENDING   Incomplete     Studies:  Recent x-ray studies have been reviewed in detail by the Attending Physician  Scheduled Meds:  Scheduled Meds: . albuterol  2.5 mg Nebulization QID   And  . ipratropium  0.5 mg Nebulization QID  . antiseptic oral rinse  15 mL Mouth Rinse q12n4p  . aspirin EC  81 mg Oral Daily  . budesonide (PULMICORT) nebulizer solution  0.25 mg Nebulization BID  . chlorhexidine  15 mL Mouth Rinse BID  . dextromethorphan-guaiFENesin  1 tablet Oral BID  . enoxaparin (LOVENOX) injection  40 mg Subcutaneous Q24H  . fluticasone  2 spray Each Nare Daily  . insulin aspart  0-15 Units Subcutaneous TID WC  . insulin aspart  0-5 Units Subcutaneous QHS  . insulin aspart  3 Units  Subcutaneous TID WC  . insulin glargine  20 Units Subcutaneous QHS  . levofloxacin  750 mg Oral q1800  . methylPREDNISolone (SOLU-MEDROL) injection  60 mg Intravenous Q6H  . nicotine  21 mg Transdermal Daily  . oxyCODONE  10 mg Oral TID  .  pantoprazole  40 mg Oral BID  . potassium chloride SA  40 mEq Oral Daily  . sertraline  50 mg Oral Daily  . spironolactone  25 mg Oral BID  . torsemide  100 mg Oral BID    Time spent on care of this patient: 35 mins   Calvert Cantor, MD  [facility] Office  [phone] Pager - Text Page per Loretha Stapler as per below:  On-Call/Text Page:      [email or web address]      password [email or web address]  If 7PM-7AM, please contact night-coverage [email or web address] Password [email or web address] 06/26/2013, 6:15 PM   LOS: 2 days

## 2013-06-26 NOTE — Telephone Encounter (Signed)
[email or web address] x1 for pt 

## 2013-06-27 LAB — GLUCOSE, CAPILLARY
Glucose-Capillary: 175 mg/dL — ABNORMAL HIGH (ref 70–99)
Glucose-Capillary: 162 mg/dL — ABNORMAL HIGH (ref 70–99)

## 2013-06-27 LAB — URINE CULTURE: Colony Count: NO GROWTH

## 2013-06-27 MED ORDER — METHYLPREDNISOLONE SODIUM SUCC 125 MG IJ SOLR
60.0000 mg | Freq: Two times a day (BID) | INTRAMUSCULAR | Status: DC
  Administered 2013-06-27 – 2013-06-30 (×6): 60 mg via INTRAVENOUS
  Filled 2013-06-27 (×7): qty 0.96

## 2013-06-27 MED ORDER — INSULIN ASPART 100 UNIT/ML ~~LOC~~ SOLN
6.0000 [IU] | Freq: Three times a day (TID) | SUBCUTANEOUS | Status: DC
  Administered 2013-06-27 – 2013-07-02 (×15): 6 [IU] via SUBCUTANEOUS

## 2013-06-27 MED ORDER — ALBUTEROL SULFATE (5 MG/ML) 0.5% IN NEBU
2.5000 mg | INHALATION_SOLUTION | RESPIRATORY_TRACT | Status: DC | PRN
  Filled 2013-06-27 (×2): qty 0.5

## 2013-06-27 NOTE — Progress Notes (Signed)
[facility] Progress Note [place] TEAM 1 - Stepdown/ICU TEAM   [name] ZOX:[id number] DOB: June 25, 1966 DOA: 06/24/2013 PCP: Rene Paci, MD  Admit HPI / Brief Narrative: 47 year old female with history of chronic hypoxic respiratory failure on 3 L O2, history of COPD, pulmonary sarcoidosis, asthma, morbid obesity, obstructive sleep apnea, and diabetes mellitus who presented to ED with progressively worsening shortness of breath for 3 days. Patient reported that her symptoms began a week prior with cough and congestion.  The day before her admit when she woke up, she had significant shortness of breath with wheezing. She called her Pulmonologist who advised her to come to the ER, and also apparently called in omnicef with prednisone taper. Patient reported that she was taking her antibiotics and prednisone with no relief. Stated she  stopped smoking 2 days prior.   In the ER, despite prolonged albuterol nebulizer treatment and Solu-Medrol the patient remained extremely short of breath.  Assessment/Plan:  Acute on chronic hypoxic resp failure/ Possible B basilar CAP Slow to improve, as is her usual per her report - cont maximal med tx - Dr Shelle Iron followed up on her this AM - add flutter valve to assist with expectoration of heavy phelgm  Acute exacerbation COPD on home O2 as noted above   Reported hx of Pulmonary sarcoidosis  Tobacco abuse  Dr Sharon Seller counseled pt extensively on absolute need to stop smoking - she reports understanding and desire to do so  Sleep apnea Cont home CPAP regimen   DM CBG poorly controlled on steroids - adjust tx again today and follow   HTN Well controlled at present - no change in tx plan today   Fibromyalgia on chronic narcotics Reports no complaints of pain today   GERD On PPI  Obesity  Code Status: FULL Family Communication: no family present at time of exam Disposition Plan: stable for transfer to med bed - cont neb tx -  begin to ambulate - anticipate 48-72hrs additional stay will be required   Consultants: Pulmonary - [place] (visiting for social visits/pt reassurance)  Procedures: none  Antibiotics: Levaquin 9/30 >> Vancomycin 9/30 >> 10/01  DVT prophylaxis: lovenox  HPI/Subjective: Pt states that she feels that she is slowly improving, but remains quite dypsneic, especially when attempting to move.  C/O heavy sputum production and difficulty expectorating it.  No chest pain, f/c, n/v, or abdom pain.    Objective: Blood pressure 120/76, pulse 110, temperature 98.2 F (36.8 C), temperature source Oral, resp. rate 27, height 5' 8.5" (1.74 m), weight 133 kg (293 lb 3.4 oz), last menstrual period 10/16/2012, SpO2 97.00%.  Intake/Output Summary (Last 24 hours) at 06/27/13 1412 Last data filed at 06/27/13 0530  Gross per 24 hour  Intake      0 ml  Output   4425 ml  Net  -4425 ml   Exam: General: mild resp distress with a prolonged exp phase  Lungs: diffuse wheeze - bibasilar crackles  Cardiovascular: Regular rate and rhythm without murmur gallop or rub  Abdomen: Nontender, nondistended, soft, bowel sounds positive, no rebound, no ascites, no appreciable mass Extremities: No significant cyanosis, clubbing, or edema bilateral lower extremities  Data Reviewed: Basic Metabolic Panel:  Recent Labs Lab 06/24/13 1617 06/25/13 0525  NA 140 134*  K 3.4* 4.2  CL 98 98  CO2 28 23  GLUCOSE 138* 198*  BUN 11 15  CREATININE 0.94 0.93  CALCIUM 9.2 8.6   Liver Function Tests: No results found for this  basename: AST, ALT, ALKPHOS, BILITOT, PROT, ALBUMIN,  in the last 168 hours  CBC:  Recent Labs Lab 06/24/13 1617 06/25/13 0525  WBC 8.3 7.1  HGB 16.0* 13.6  HCT 46.2* 40.7  MCV 78.7 78.4  PLT 236 208   CBG:  Recent Labs Lab 06/25/13 2100 06/26/13 0807 06/26/13 1151 06/26/13 1708 06/27/13 0747  GLUCAP 222* 188* 148* 159* 175*    Recent Results (from the past 240 hour(s))  MRSA  PCR SCREENING     Status: None   Collection Time    06/24/13  6:56 PM      Result Value Range Status   MRSA by PCR NEGATIVE  NEGATIVE Final   Comment:            The GeneXpert MRSA Assay (FDA     approved for NASAL specimens     only), is one component of a     comprehensive MRSA colonization     surveillance program. It is not     intended to diagnose MRSA     infection nor to guide or     monitor treatment for     MRSA infections.  CULTURE, BLOOD (ROUTINE X 2)     Status: None   Collection Time    06/25/13 12:40 AM      Result Value Range Status   Specimen Description BLOOD LEFT ARM   Final   Special Requests BOTTLES DRAWN AEROBIC ONLY 10CC   Final   Culture  Setup Time     Final   Value: 06/25/2013 04:39     Performed at [facility]   Culture     Final   Value:        BLOOD CULTURE RECEIVED NO GROWTH TO DATE CULTURE WILL BE HELD FOR 5 DAYS BEFORE ISSUING A FINAL NEGATIVE REPORT     Performed at [facility]   Report Status PENDING   Incomplete  CULTURE, BLOOD (ROUTINE X 2)     Status: None   Collection Time    06/25/13  1:12 AM      Result Value Range Status   Specimen Description Blood   Final   Special Requests NONE   Final   Culture  Setup Time     Final   Value: 06/25/2013 04:39     Performed at [facility]   Culture     Final   Value:        BLOOD CULTURE RECEIVED NO GROWTH TO DATE CULTURE WILL BE HELD FOR 5 DAYS BEFORE ISSUING A FINAL NEGATIVE REPORT     Performed at [facility]   Report Status PENDING   Incomplete  URINE CULTURE     Status: None   Collection Time    06/25/13 11:19 AM      Result Value Range Status   Specimen Description URINE, RANDOM   Final   Special Requests NONE   Final   Culture  Setup Time     Final   Value: 06/25/2013 13:40     Performed at [facility] Count     Final   Value: NO GROWTH     Performed at [facility]   Culture     Final   Value: NO GROWTH     Performed  at [facility]   Report Status 06/27/2013 FINAL   Final     Studies:  Recent x-ray studies have been reviewed in detail  by the Attending Physician  Scheduled Meds:  Scheduled Meds: . albuterol  2.5 mg Nebulization QID   And  . ipratropium  0.5 mg Nebulization QID  . antiseptic oral rinse  15 mL Mouth Rinse q12n4p  . aspirin EC  81 mg Oral Daily  . budesonide (PULMICORT) nebulizer solution  0.25 mg Nebulization BID  . dextromethorphan-guaiFENesin  1 tablet Oral BID  . enoxaparin (LOVENOX) injection  40 mg Subcutaneous Q24H  . fluticasone  2 spray Each Nare Daily  . insulin aspart  0-15 Units Subcutaneous TID WC  . insulin aspart  0-5 Units Subcutaneous QHS  . insulin aspart  3 Units Subcutaneous TID WC  . insulin glargine  20 Units Subcutaneous QHS  . levofloxacin  750 mg Oral q1800  . methylPREDNISolone (SOLU-MEDROL) injection  60 mg Intravenous Q6H  . nicotine  21 mg Transdermal Daily  . oxyCODONE  10 mg Oral TID  . pantoprazole  40 mg Oral BID  . potassium chloride SA  40 mEq Oral Daily  . sertraline  50 mg Oral Daily  . spironolactone  25 mg Oral BID  . torsemide  100 mg Oral BID    Time spent on care of this patient: 35 mins   Reilyn Nelson T, MD  [facility] Office  [phone] Pager - Text Page per Loretha Stapler as per below:  On-Call/Text Page:      [email or web address]      password [email or web address]  If 7PM-7AM, please contact night-coverage [email or web address] Password [email or web address] 06/27/2013, 2:12 PM   LOS: 3 days

## 2013-06-27 NOTE — Progress Notes (Signed)
Pt was given to receiving rn on 5W, and pt has been transferred off the floor. ---Cristyn Crossno,rn

## 2013-06-27 NOTE — ED Provider Notes (Signed)
Medical screening examination/treatment/procedure(s) were performed by non-physician practitioner and as supervising physician I was immediately available for consultation/collaboration.  Toy Baker, MD 06/27/13 [phone]

## 2013-06-28 LAB — BASIC METABOLIC PANEL
BUN: 27 mg/dL — ABNORMAL HIGH (ref 6–23)
Calcium: 9.2 mg/dL (ref 8.4–10.5)
Creatinine, Ser: 1.18 mg/dL — ABNORMAL HIGH (ref 0.50–1.10)
GFR calc Af Amer: 63 mL/min — ABNORMAL LOW (ref >90)
GFR calc non Af Amer: 54 mL/min — ABNORMAL LOW (ref >90)
Potassium: 3.9 mEq/L (ref 3.5–5.1)

## 2013-06-28 LAB — GLUCOSE, CAPILLARY
Glucose-Capillary: 169 mg/dL — ABNORMAL HIGH (ref 70–99)
Glucose-Capillary: 191 mg/dL — ABNORMAL HIGH (ref 70–99)
Glucose-Capillary: 226 mg/dL — ABNORMAL HIGH (ref 70–99)

## 2013-06-28 MED ORDER — SENNA 8.6 MG PO TABS
2.0000 | ORAL_TABLET | Freq: Every day | ORAL | Status: DC
  Administered 2013-06-28 – 2013-07-01 (×4): 17.2 mg via ORAL
  Filled 2013-06-28 (×5): qty 2

## 2013-06-28 MED ORDER — BISACODYL 10 MG RE SUPP: 10.0000 mg | Freq: Every day | RECTAL | Status: DC | PRN

## 2013-06-28 MED ORDER — POLYETHYLENE GLYCOL 3350 17 G PO PACK
17.0000 g | PACK | Freq: Two times a day (BID) | ORAL | Status: DC
  Administered 2013-06-28 – 2013-07-01 (×6): 17 g via ORAL
  Filled 2013-06-28 (×11): qty 1

## 2013-06-28 NOTE — Evaluation (Signed)
Physical Therapy Evaluation Patient Details Name: [name] MRN: [id number] DOB: Aug 06, 1966 Today's Date: 06/28/2013 Time: 1030-1050 PT Time Calculation (min): 20 min  PT Assessment / Plan / Recommendation History of Present Illness  Pt presents with increasing dyspnea after URI, now with PNA, acute on chronic respiratory failure in setting of COPD. Pt also with DM.  Clinical Impression  Pt ambulated 200', but tolerated only 50' without AD. RW helped greatly with energy conservation as well as mgmt of back pain. Pt educated on energy conservation techniques. Is mod I with activity and does not currently require acute PT but highly recommend 4-wheel RW with seat for d/c. O2 sats down to 78% with ambulation on 3L, O2 increased to 4L and proper breathing encouraged with increase in O2 sats to 94% in 2 mins. PT signing off at this time.    PT Assessment  Patent does not need any further PT services    Follow Up Recommendations  No PT follow up    Does the patient have the potential to tolerate intense rehabilitation      Barriers to Discharge        Equipment Recommendations  Other (comment) (4 wheel RW with seat)    Recommendations for Other Services     Frequency      Precautions / Restrictions Precautions Precautions: Other (comment) (monitor O2 sats) Restrictions Weight Bearing Restrictions: No   Pertinent Vitals/Pain O2 sats 78% after amb on 3L, returned to 94% with rest and 4L O2      Mobility  Bed Mobility Bed Mobility: Not assessed (pt EOB) Transfers Transfers: Sit to Stand;Stand to Sit Sit to Stand: 6: Modified independent (Device/Increase time);From bed Stand to Sit: 6: Modified independent (Device/Increase time);To bed Ambulation/Gait Ambulation/Gait Assistance: 6: Modified independent (Device/Increase time) Ambulation Distance (Feet): 200 Feet Assistive device: Rolling walker;None Ambulation/Gait Assistance Details: began with no AD, pt without balance  deficits but stamina is greatly decreased, after 50', pt was very fatigued and felt she could not keep walking. Pt was given RW at that point and was able to keep going double the distance further.  Gait Pattern: Step-through pattern Gait velocity: decreased General Gait Details: pt would greatly benefit from 4-wheel RW with seat for energy conservation as well as control of back pain with ambulation Stairs: No Wheelchair Mobility Wheelchair Mobility: No    Exercises     PT Diagnosis:    PT Problem List:   PT Treatment Interventions:       PT Goals(Current goals can be found in the care plan section) Acute Rehab PT Goals Patient Stated Goal: return home PT Goal Formulation: No goals set, d/c therapy  Visit Information  Last PT Received On: 06/28/13 Assistance Needed: +1 History of Present Illness: Pt presents with increasing dyspnea after URI, now with PNA, acute on chronic respiratory failure in setting of COPD. Pt also with DM.       Prior Functioning  Home Living Family/patient expects to be discharged to:: Private residence Living Arrangements: Alone Available Help at Discharge: Family;Available PRN/intermittently Type of Home: Apartment Home Access: Level entry Home Layout: One level Home Equipment: Shower seat Additional Comments: pt's sister helps her with grocery shopping, as pt does not drive. She reports that she is going to have help coming in for house cleaning, laundry, etc. She does not have AD and would benefit. Prior Function Level of Independence: Independent Communication Communication: No difficulties    Cognition  Cognition Arousal/Alertness: Awake/alert Behavior During Therapy: [facility]  for tasks assessed/performed Overall Cognitive Status: Within Functional Limits for tasks assessed    Extremity/Trunk Assessment Upper Extremity Assessment Upper Extremity Assessment: Overall WFL for tasks assessed Lower Extremity Assessment Lower Extremity  Assessment: RLE deficits/detail;LLE deficits/detail RLE Deficits / Details: pt strength WFL however, when O2 sats drop, she gets weak and dizzy sensation LLE Deficits / Details: same as RLE Cervical / Trunk Assessment Cervical / Trunk Assessment: Normal   Balance Balance Balance Assessed: Yes Dynamic Standing Balance Dynamic Standing - Balance Support: No upper extremity supported;During functional activity Dynamic Standing - Level of Assistance: 6: Modified independent (Device/Increase time)  End of Session PT - End of Session Equipment Utilized During Treatment: Gait belt;Oxygen Activity Tolerance: Patient limited by fatigue Patient left: in bed;with call bell/phone within reach Nurse Communication: Mobility status;Other (comment) (O2 status)  GP   Lyanne Co, PT  Acute Rehab Services  [phone]   Lyanne Co 06/28/2013, 11:56 AM

## 2013-06-28 NOTE — Progress Notes (Signed)
[facility] Progress Note [name] TEAM 1 - Stepdown/ICU TEAM   [name] ZOX:[id number] DOB: [birth year] DOA: 06/24/2013 PCP: Rene Paci, MD  Admit HPI / Brief Narrative: 47 year old female with history of chronic hypoxic respiratory failure on 3 L O2, history of COPD, pulmonary sarcoidosis, asthma, morbid obesity, obstructive sleep apnea, and diabetes mellitus who presented to ED with progressively worsening shortness of breath for 3 days. Patient reported that her symptoms began a week prior with cough and congestion.  The day before her admit when she woke up, she had significant shortness of breath with wheezing. She called her Pulmonologist who advised her to come to the ER, and also apparently called in omnicef with prednisone taper. Patient reported that she was taking her antibiotics and prednisone with no relief. Stated she  stopped smoking 2 days prior.   In the ER, despite prolonged albuterol nebulizer treatment and Solu-Medrol the patient remained extremely short of breath.  Assessment/Plan:  Acute on chronic hypoxic resp failure/ Possible B basilar CAP - Slow to improve, as is her usual per her report - cont maximal med tx  - added flutter valve to assist with expectoration of heavy phelgm - Continue current management  Acute exacerbation COPD on home O2 - Continue antibiotics, steroids, oxygen, bronchodilator nebulizations & flutter valve - Tobacco cessation again counseled.  Reported hx of Pulmonary sarcoidosis  Tobacco abuse  Repeatedly counseled regarding cessation - she reports understanding and desire to do so  Sleep apnea Cont home CPAP regimen   DM CBG poorly controlled on steroids - adjust tx again today and follow   HTN Well controlled at present - no change in tx plan today   Fibromyalgia on chronic narcotics Reports no complaints of pain today   GERD On PPI  Obesity  Constipation - Start bowel regimen  Code Status:  FULL Family Communication: no family present at time of exam Disposition Plan: anticipate 48-72hrs additional stay will be required   Consultants: Pulmonary - Clance (visiting for social visits/pt reassurance)  Procedures: none  Antibiotics: Levaquin 9/30 >> Vancomycin 9/30 >> 10/01  DVT prophylaxis: lovenox  HPI/Subjective: Cough with thick sputum-feels like she is choking on it. Still has dyspnea and wheezing, but slowly improving. Constipation  Objective: Blood pressure 127/72, pulse 75, temperature 97.4 F (36.3 C), temperature source Oral, resp. rate 18, height 5' 8.5" (1.74 m), weight 131.9 kg (290 lb 12.6 oz), last menstrual period 10/16/2012, SpO2 92.00%.  Intake/Output Summary (Last 24 hours) at 06/28/13 1340 Last data filed at 06/28/13 0900  Gross per 24 hour  Intake    240 ml  Output   3800 ml  Net  -3560 ml   Exam: General: Morbidly obese female, in bed, in no obvious distress  Lungs: Reduced breath sounds bilaterally with scattered a few medium pitched expiratory rhonchi bilaterally. No crackles. No increased work of breathing. Able to speak in full sentences.  Cardiovascular: S1 and S2 heard, RRR. No JVD, murmurs or gallops. Trace bilateral ankle edema. Abdomen: Nontender, nondistended, soft, bowel sounds positive, no rebound, no ascites, no appreciable mass Extremities: Symmetric 5 x 5 power. CNS: Alert and oriented. No focal neurological deficits.  Data Reviewed: Basic Metabolic Panel:  Recent Labs Lab 06/24/13 1617 06/25/13 0525 06/28/13 0545  NA 140 134* 139  K 3.4* 4.2 3.9  CL 98 98 90*  CO2 28 23 32  GLUCOSE 138* 198* 194*  BUN 11 15 27*  CREATININE 0.94 0.93 1.18*  CALCIUM 9.2 8.6 9.2  Liver Function Tests: No results found for this basename: AST, ALT, ALKPHOS, BILITOT, PROT, ALBUMIN,  in the last 168 hours  CBC:  Recent Labs Lab 06/24/13 1617 06/25/13 0525  WBC 8.3 7.1  HGB 16.0* 13.6  HCT 46.2* 40.7  MCV 78.7 78.4  PLT 236  208   CBG:  Recent Labs Lab 06/27/13 0747 06/27/13 1222 06/27/13 2153 06/28/13 0856 06/28/13 1150  GLUCAP 175* 162* 195* 198* 169*    Recent Results (from the past 240 hour(s))  MRSA PCR SCREENING     Status: None   Collection Time    06/24/13  6:56 PM      Result Value Range Status   MRSA by PCR NEGATIVE  NEGATIVE Final   Comment:            The GeneXpert MRSA Assay (FDA     approved for NASAL specimens     only), is one component of a     comprehensive MRSA colonization     surveillance program. It is not     intended to diagnose MRSA     infection nor to guide or     monitor treatment for     MRSA infections.  CULTURE, BLOOD (ROUTINE X 2)     Status: None   Collection Time    06/25/13 12:40 AM      Result Value Range Status   Specimen Description BLOOD LEFT ARM   Final   Special Requests BOTTLES DRAWN AEROBIC ONLY 10CC   Final   Culture  Setup Time     Final   Value: 06/25/2013 04:39     Performed at [facility]   Culture     Final   Value:        BLOOD CULTURE RECEIVED NO GROWTH TO DATE CULTURE WILL BE HELD FOR 5 DAYS BEFORE ISSUING A FINAL NEGATIVE REPORT     Performed at [facility]   Report Status PENDING   Incomplete  CULTURE, BLOOD (ROUTINE X 2)     Status: None   Collection Time    06/25/13  1:12 AM      Result Value Range Status   Specimen Description Blood   Final   Special Requests NONE   Final   Culture  Setup Time     Final   Value: 06/25/2013 04:39     Performed at [facility]   Culture     Final   Value:        BLOOD CULTURE RECEIVED NO GROWTH TO DATE CULTURE WILL BE HELD FOR 5 DAYS BEFORE ISSUING A FINAL NEGATIVE REPORT     Performed at [facility]   Report Status PENDING   Incomplete  URINE CULTURE     Status: None   Collection Time    06/25/13 11:19 AM      Result Value Range Status   Specimen Description URINE, RANDOM   Final   Special Requests NONE   Final   Culture  Setup Time     Final   Value:  06/25/2013 13:40     Performed at [facility] Count     Final   Value: NO GROWTH     Performed at [facility]   Culture     Final   Value: NO GROWTH     Performed at [facility]   Report Status 06/27/2013 FINAL   Final     Studies:  Recent x-ray studies have been reviewed in detail by the Attending Physician  Scheduled Meds:  Scheduled Meds: . albuterol  2.5 mg Nebulization QID   And  . ipratropium  0.5 mg Nebulization QID  . antiseptic oral rinse  15 mL Mouth Rinse q12n4p  . aspirin EC  81 mg Oral Daily  . budesonide (PULMICORT) nebulizer solution  0.25 mg Nebulization BID  . dextromethorphan-guaiFENesin  1 tablet Oral BID  . enoxaparin (LOVENOX) injection  40 mg Subcutaneous Q24H  . fluticasone  2 spray Each Nare Daily  . insulin aspart  0-15 Units Subcutaneous TID WC  . insulin aspart  0-5 Units Subcutaneous QHS  . insulin aspart  6 Units Subcutaneous TID WC  . insulin glargine  20 Units Subcutaneous QHS  . levofloxacin  750 mg Oral q1800  . methylPREDNISolone (SOLU-MEDROL) injection  60 mg Intravenous Q12H  . nicotine  21 mg Transdermal Daily  . oxyCODONE  10 mg Oral TID  . pantoprazole  40 mg Oral BID  . polyethylene glycol  17 g Oral BID  . senna  2 tablet Oral Daily  . sertraline  50 mg Oral Daily  . spironolactone  25 mg Oral BID  . torsemide  100 mg Oral BID    Time spent on care of this patient: 35 mins   Trevionne Advani, MD Pager: [phone] [facility]  If 7PM-7AM, please contact night-coverage [email or web address] Password [email or web address] 06/28/2013, 1:40 PM   LOS: 4 days

## 2013-06-28 NOTE — Progress Notes (Signed)
[name] [id number] Admitted to 8G95: 06/28/2013 1:54 AM Attending Provider: Elease Etienne, MD    [name] is a 47 y.o. female patient admitted from ED awake, alert  & orientated  X 3,  Full Code, VSS - Blood pressure 127/70, pulse 73, temperature 97.7 F (36.5 C), temperature source Oral, resp. rate 22, height 5' 8.5" (1.74 m), weight 131.9 kg (290 lb 12.6 oz), last menstrual period 10/16/2012, SpO2 95.00%., O2    4 L nasal cannular, no c/o shortness of breath, no c/o chest pain, no distress noted.   IV site WDL:  wrist right, condition patent and no redness and left, condition patent and no redness with a transparent dsg that's clean dry and intact.  Allergies:   Allergies  Allergen Reactions  . Ciprofloxacin     REACTION: nausea  . Latex     REACTION: rash, burning  . Sulfamethoxazole-Trimethoprim     Bactrim REACTION: Projectile Vomiting  . Azithromycin     REACTION: resistant  . Doxycycline     REACTION: restistant  . Metformin And Related Other (See Comments)    Severe diarrhea  . Metronidazole Nausea And Vomiting  . Simvastatin      Past Medical History  Diagnosis Date  . Sleep apnea     noncompliant w/ CPAP  . Hypertension   . Hyperlipidemia   . Chronic headache   . Fibromyalgia     daily narcotics  . Anxiety     hx chronic BZ use, stopped 07/2010  . Anemia   . Pulmonary sarcoidosis     unimpressive CT chest 2011  . Colonic polyp   . GERD (gastroesophageal reflux disease)   . ALLERGIC RHINITIS   . Asthma   . CHF (congestive heart failure)     Diastolic with fluid overload, May, 2012, LVEF 60%  . Morbid obesity   . Depression   . Panic attacks   . Diabetes mellitus   . COPD (chronic obstructive pulmonary disease)     on home O2, moderate airflow obstruction, suspect d/t emphysema  . Obesity   . Elevated LFTs 09/2011  . Ovarian cyst   . Chronic back pain     History:  obtained from patient.  Pt orientation to unit, room and routine.  Information packet given to patient/family and safety video watched.  Admission INP armband ID verified with patient/family, and in place. SR up x 2, fall risk assessment complete with Patient and family verbalizing understanding of risks associated with falls. Pt verbalizes an understanding of how to use the call bell and to call for help before getting out of bed.  Skin, clean-dry- intact without evidence of bruising, or skin tears.   No evidence of skin break down noted on exam.    Will cont to monitor and assist as needed.  Tessie Eke, RN 06/28/2013 1:54 AM

## 2013-06-28 NOTE — Progress Notes (Signed)
Pt had originally asked to be put on cpap later in the night.  Rt went by to speak with her about concerns with her mask.  Upon entering the room, the pt refused use for the night with/without the new mask the rt brought.  Rt told pt if she changes her mind to call her Rn and she/he would call RT.  RT will continue to monitor.  Pt was left on [place], and in no distress.

## 2013-06-29 DIAGNOSIS — F17200 Nicotine dependence, unspecified, uncomplicated: Secondary | ICD-10-CM

## 2013-06-29 LAB — GLUCOSE, CAPILLARY: Glucose-Capillary: 163 mg/dL — ABNORMAL HIGH (ref 70–99)

## 2013-06-29 MED ORDER — ONDANSETRON HCL 4 MG PO TABS
4.0000 mg | ORAL_TABLET | Freq: Four times a day (QID) | ORAL | Status: DC | PRN
  Administered 2013-06-29: 4 mg via ORAL
  Filled 2013-06-29: qty 1

## 2013-06-29 NOTE — Progress Notes (Signed)
Patient got RN to call RT wanting to try again for CPAP.  Switched to a size medium mask and patients states that it feels better and she would like to try to wear it.  RT will continue to monitor.

## 2013-06-29 NOTE — Progress Notes (Signed)
[facility] Progress Note    [name] HQI:[id number] DOB: 23-Jun-1966 DOA: 06/24/2013 PCP: Rene Paci, MD  Admit HPI / Brief Narrative: 47 year old female with history of chronic hypoxic respiratory failure on 3 L O2, history of COPD, pulmonary sarcoidosis, asthma, morbid obesity, obstructive sleep apnea, and diabetes mellitus who presented to ED with progressively worsening shortness of breath for 3 days. Patient reported that her symptoms began a week prior with cough and congestion.  The day before her admit when she woke up, she had significant shortness of breath with wheezing. She called her Pulmonologist who advised her to come to the ER, and also apparently called in omnicef with prednisone taper. Patient reported that she was taking her antibiotics and prednisone with no relief. Stated she  stopped smoking 2 days prior.   In the ER, despite prolonged albuterol nebulizer treatment and Solu-Medrol the patient remained extremely short of breath.  Assessment/Plan:  Acute on chronic hypoxic resp failure/ Possible B basilar CAP - Slow to improve, as is her usual per her report - cont maximal med tx  - added flutter valve to assist with expectoration of heavy phelgm - Continue current management - Improving. Consider switching to oral prednisone in a.m.  Acute exacerbation COPD on home O2 - Continue antibiotics, steroids, oxygen, bronchodilator nebulizations & flutter valve - Tobacco cessation again counseled.  Reported hx of Pulmonary sarcoidosis  Tobacco abuse  Repeatedly counseled regarding cessation - she reports understanding and desire to do so  Sleep apnea Cont home CPAP regimen   DM CBG poorly controlled on steroids - adjust tx again today and follow   HTN Well controlled at present - no change in tx plan today   Fibromyalgia on chronic narcotics Reports no complaints of pain today   GERD On PPI  Obesity  Constipation - Start bowel regimen -  had BM  Code Status: FULL Family Communication: no family present at time of exam Disposition Plan: anticipate 48 hrs additional stay will be required   Consultants: Pulmonary - Clance (visiting for social visits/pt reassurance)  Procedures: none  Antibiotics: Levaquin 9/30 >>10/6 Vancomycin 9/30 >> 10/01  DVT prophylaxis: lovenox  HPI/Subjective: Patient had DM. States that overall she is 50% better. Cough, dyspnea and wheezing continued to slowly improve. States that she has intermittent panic attacks.  Objective: Blood pressure 113/64, pulse 67, temperature 97.1 F (36.2 C), temperature source Oral, resp. rate 20, height 5' 8.5" (1.74 m), weight 131.9 kg (290 lb 12.6 oz), last menstrual period 10/16/2012, SpO2 98.00%.  Intake/Output Summary (Last 24 hours) at 06/29/13 1015 Last data filed at 06/29/13 0900  Gross per 24 hour  Intake    240 ml  Output   4700 ml  Net  -4460 ml   Exam: General: Morbidly obese female, in bed, in no obvious distress  Lungs: Breath sounds better than 10/4-less wheezing and rhonchi. No crackles. No increased work of breathing. Able to speak in full sentences.  Cardiovascular: S1 and S2 heard, RRR. No JVD, murmurs or gallops. Trace bilateral ankle edema. Abdomen: Nontender, nondistended, soft, bowel sounds positive, no rebound, no ascites, no appreciable mass Extremities: Symmetric 5 x 5 power. CNS: Alert and oriented. No focal neurological deficits.  Data Reviewed: Basic Metabolic Panel:  Recent Labs Lab 06/24/13 1617 06/25/13 0525 06/28/13 0545  NA 140 134* 139  K 3.4* 4.2 3.9  CL 98 98 90*  CO2 28 23 32  GLUCOSE 138* 198* 194*  BUN 11 15 27*  CREATININE  0.94 0.93 1.18*  CALCIUM 9.2 8.6 9.2   Liver Function Tests: No results found for this basename: AST, ALT, ALKPHOS, BILITOT, PROT, ALBUMIN,  in the last 168 hours  CBC:  Recent Labs Lab 06/24/13 1617 06/25/13 0525  WBC 8.3 7.1  HGB 16.0* 13.6  HCT 46.2* 40.7  MCV 78.7  78.4  PLT 236 208   CBG:  Recent Labs Lab 06/28/13 0856 06/28/13 1150 06/28/13 1744 06/28/13 2056 06/29/13 0745  GLUCAP 198* 169* 191* 226* 163*    Recent Results (from the past 240 hour(s))  MRSA PCR SCREENING     Status: None   Collection Time    06/24/13  6:56 PM      Result Value Range Status   MRSA by PCR NEGATIVE  NEGATIVE Final   Comment:            The GeneXpert MRSA Assay (FDA     approved for NASAL specimens     only), is one component of a     comprehensive MRSA colonization     surveillance program. It is not     intended to diagnose MRSA     infection nor to guide or     monitor treatment for     MRSA infections.  CULTURE, BLOOD (ROUTINE X 2)     Status: None   Collection Time    06/25/13 12:40 AM      Result Value Range Status   Specimen Description BLOOD LEFT ARM   Final   Special Requests BOTTLES DRAWN AEROBIC ONLY 10CC   Final   Culture  Setup Time     Final   Value: 06/25/2013 04:39     Performed at [facility]   Culture     Final   Value:        BLOOD CULTURE RECEIVED NO GROWTH TO DATE CULTURE WILL BE HELD FOR 5 DAYS BEFORE ISSUING A FINAL NEGATIVE REPORT     Performed at [facility]   Report Status PENDING   Incomplete  CULTURE, BLOOD (ROUTINE X 2)     Status: None   Collection Time    06/25/13  1:12 AM      Result Value Range Status   Specimen Description Blood   Final   Special Requests NONE   Final   Culture  Setup Time     Final   Value: 06/25/2013 04:39     Performed at [facility]   Culture     Final   Value:        BLOOD CULTURE RECEIVED NO GROWTH TO DATE CULTURE WILL BE HELD FOR 5 DAYS BEFORE ISSUING A FINAL NEGATIVE REPORT     Performed at [facility]   Report Status PENDING   Incomplete  URINE CULTURE     Status: None   Collection Time    06/25/13 11:19 AM      Result Value Range Status   Specimen Description URINE, RANDOM   Final   Special Requests NONE   Final   Culture  Setup Time      Final   Value: 06/25/2013 13:40     Performed at [facility] Count     Final   Value: NO GROWTH     Performed at [facility]   Culture     Final   Value: NO GROWTH     Performed at [facility]   Report Status 06/27/2013  FINAL   Final     Studies:  Recent x-ray studies have been reviewed in detail by the Attending Physician  Scheduled Meds:  Scheduled Meds: . albuterol  2.5 mg Nebulization QID   And  . ipratropium  0.5 mg Nebulization QID  . antiseptic oral rinse  15 mL Mouth Rinse q12n4p  . aspirin EC  81 mg Oral Daily  . budesonide (PULMICORT) nebulizer solution  0.25 mg Nebulization BID  . dextromethorphan-guaiFENesin  1 tablet Oral BID  . enoxaparin (LOVENOX) injection  40 mg Subcutaneous Q24H  . fluticasone  2 spray Each Nare Daily  . insulin aspart  0-15 Units Subcutaneous TID WC  . insulin aspart  0-5 Units Subcutaneous QHS  . insulin aspart  6 Units Subcutaneous TID WC  . insulin glargine  20 Units Subcutaneous QHS  . levofloxacin  750 mg Oral q1800  . methylPREDNISolone (SOLU-MEDROL) injection  60 mg Intravenous Q12H  . nicotine  21 mg Transdermal Daily  . oxyCODONE  10 mg Oral TID  . pantoprazole  40 mg Oral BID  . polyethylene glycol  17 g Oral BID  . senna  2 tablet Oral Daily  . sertraline  50 mg Oral Daily  . spironolactone  25 mg Oral BID  . torsemide  100 mg Oral BID    Time spent on care of this patient: 25 mins   Shaley Leavens, MD Pager: [phone] [facility]  If 7PM-7AM, please contact night-coverage [email or web address] Password [email or web address] 06/29/2013, 10:15 AM   LOS: 5 days

## 2013-06-29 NOTE — Progress Notes (Signed)
Slept throughout night. No distress noted.

## 2013-06-30 ENCOUNTER — Other Ambulatory Visit: Payer: Self-pay | Admitting: Internal Medicine

## 2013-06-30 LAB — BASIC METABOLIC PANEL
Calcium: 9.1 mg/dL (ref 8.4–10.5)
Creatinine, Ser: 1.21 mg/dL — ABNORMAL HIGH (ref 0.50–1.10)
GFR calc Af Amer: 61 mL/min — ABNORMAL LOW (ref >90)
Glucose, Bld: 183 mg/dL — ABNORMAL HIGH (ref 70–99)
Potassium: 3.5 mEq/L (ref 3.5–5.1)

## 2013-06-30 LAB — GLUCOSE, CAPILLARY
Glucose-Capillary: 166 mg/dL — ABNORMAL HIGH (ref 70–99)
Glucose-Capillary: 155 mg/dL — ABNORMAL HIGH (ref 70–99)
Glucose-Capillary: 164 mg/dL — ABNORMAL HIGH (ref 70–99)

## 2013-06-30 MED ORDER — NYSTATIN 100000 UNIT/ML MT SUSP
5.0000 mL | Freq: Four times a day (QID) | OROMUCOSAL | Status: DC
  Administered 2013-06-30 – 2013-07-02 (×9): 500000 [IU] via ORAL
  Filled 2013-06-30 (×12): qty 5

## 2013-06-30 MED ORDER — PREDNISONE 50 MG PO TABS
50.0000 mg | ORAL_TABLET | Freq: Every day | ORAL | Status: DC
  Filled 2013-06-30 (×2): qty 1

## 2013-06-30 MED ORDER — PREDNISONE 50 MG PO TABS
50.0000 mg | ORAL_TABLET | Freq: Every day | ORAL | Status: DC
  Administered 2013-07-01 – 2013-07-02 (×2): 50 mg via ORAL
  Filled 2013-06-30 (×3): qty 1

## 2013-06-30 NOTE — Progress Notes (Signed)
[facility] Progress Note    [name] ZOX:[id number] DOB: [birth year] DOA: 06/24/2013 PCP: Rene Paci, MD  Admit HPI / Brief Narrative: 47 year old female with history of chronic hypoxic respiratory failure on 3 L O2, history of COPD, pulmonary sarcoidosis, asthma, morbid obesity, obstructive sleep apnea, and diabetes mellitus who presented to ED with progressively worsening shortness of breath for 3 days. Patient reported that her symptoms began a week prior with cough and congestion.  The day before her admit when she woke up, she had significant shortness of breath with wheezing. She called her Pulmonologist who advised her to come to the ER, and also apparently called in omnicef with prednisone taper. Patient reported that she was taking her antibiotics and prednisone with no relief. Stated she  stopped smoking 2 days prior.   In the ER, despite prolonged albuterol nebulizer treatment and Solu-Medrol the patient remained extremely short of breath.  Assessment/Plan:  Acute on chronic hypoxic resp failure/ Possible B basilar CAP - Slow to improve, as is her usual per her report - cont maximal med tx  - added flutter valve to assist with expectoration of heavy phelgm - Continue current management - Improving. Will transition to oral prednisone today.  Acute exacerbation COPD on home O2 @ 3 L per minute - Continue antibiotics, steroids, oxygen, bronchodilator nebulizations & flutter valve - Tobacco cessation again counseled. - Continues to gradually improve.  Reported hx of Pulmonary sarcoidosis  Tobacco abuse  Repeatedly counseled regarding cessation - she reports understanding and desire to do so  Sleep apnea Cont home CPAP regimen   DM CBG poorly controlled on steroids - adjust tx again today and follow   HTN Well controlled at present - no change in tx plan today   Fibromyalgia on chronic narcotics Reports no complaints of pain today   GERD On  PPI  Obesity  Constipation - Start bowel regimen - had BM  Code Status: FULL Family Communication: no family present at time of exam Disposition Plan: Possible discharge 10/7  Consultants: Pulmonary - [facility] (visiting for social visits/pt reassurance)  Procedures: none  Antibiotics: Levaquin 9/30 >>10/6 Vancomycin 9/30 >> 10/01  DVT prophylaxis: lovenox  HPI/Subjective: Breathing continues to improve. No new complaints.  Objective: Blood pressure 151/74, pulse 78, temperature 97.8 F (36.6 C), temperature source Oral, resp. rate 20, height 5' 8.5" (1.74 m), weight 131.9 kg (290 lb 12.6 oz), last menstrual period 10/16/2012, SpO2 92.00%.  Intake/Output Summary (Last 24 hours) at 06/30/13 1459 Last data filed at 06/29/13 1600  Gross per 24 hour  Intake      0 ml  Output    500 ml  Net   -500 ml   Exam: General: Morbidly obese female, in bed, in no obvious distress  Lungs: Fair breath sounds bilaterally. Occasional rhonchi bilaterally but much reduced compared to 48 hours ago. No increased work of breathing. Able to speak in full sentences.  Cardiovascular: S1 and S2 heard, RRR. No JVD, murmurs or gallops. Trace bilateral ankle edema. Abdomen: Nontender, nondistended, soft, bowel sounds positive, no rebound, no ascites, no appreciable mass Extremities: Symmetric 5 x 5 power. CNS: Alert and oriented. No focal neurological deficits.  Data Reviewed: Basic Metabolic Panel:  Recent Labs Lab 06/24/13 1617 06/25/13 0525 06/28/13 0545  NA 140 134* 139  K 3.4* 4.2 3.9  CL 98 98 90*  CO2 28 23 32  GLUCOSE 138* 198* 194*  BUN 11 15 27*  CREATININE 0.94 0.93 1.18*  CALCIUM 9.2  8.6 9.2   Liver Function Tests: No results found for this basename: AST, ALT, ALKPHOS, BILITOT, PROT, ALBUMIN,  in the last 168 hours  CBC:  Recent Labs Lab 06/24/13 1617 06/25/13 0525  WBC 8.3 7.1  HGB 16.0* 13.6  HCT 46.2* 40.7  MCV 78.7 78.4  PLT 236 208   CBG:  Recent  Labs Lab 06/29/13 1137 06/29/13 1731 06/29/13 2131 06/30/13 0754 06/30/13 1146  GLUCAP 193* 130* 201* 166* 155*    Recent Results (from the past 240 hour(s))  MRSA PCR SCREENING     Status: None   Collection Time    06/24/13  6:56 PM      Result Value Range Status   MRSA by PCR NEGATIVE  NEGATIVE Final   Comment:            The GeneXpert MRSA Assay (FDA     approved for NASAL specimens     only), is one component of a     comprehensive MRSA colonization     surveillance program. It is not     intended to diagnose MRSA     infection nor to guide or     monitor treatment for     MRSA infections.  CULTURE, BLOOD (ROUTINE X 2)     Status: None   Collection Time    06/25/13 12:40 AM      Result Value Range Status   Specimen Description BLOOD LEFT ARM   Final   Special Requests BOTTLES DRAWN AEROBIC ONLY 10CC   Final   Culture  Setup Time     Final   Value: 06/25/2013 04:39     Performed at [facility]   Culture     Final   Value:        BLOOD CULTURE RECEIVED NO GROWTH TO DATE CULTURE WILL BE HELD FOR 5 DAYS BEFORE ISSUING A FINAL NEGATIVE REPORT     Performed at [facility]   Report Status PENDING   Incomplete  CULTURE, BLOOD (ROUTINE X 2)     Status: None   Collection Time    06/25/13  1:12 AM      Result Value Range Status   Specimen Description Blood   Final   Special Requests NONE   Final   Culture  Setup Time     Final   Value: 06/25/2013 04:39     Performed at [facility]   Culture     Final   Value:        BLOOD CULTURE RECEIVED NO GROWTH TO DATE CULTURE WILL BE HELD FOR 5 DAYS BEFORE ISSUING A FINAL NEGATIVE REPORT     Performed at [facility]   Report Status PENDING   Incomplete  URINE CULTURE     Status: None   Collection Time    06/25/13 11:19 AM      Result Value Range Status   Specimen Description URINE, RANDOM   Final   Special Requests NONE   Final   Culture  Setup Time     Final   Value: 06/25/2013 13:40      Performed at [facility] Count     Final   Value: NO GROWTH     Performed at [facility]   Culture     Final   Value: NO GROWTH     Performed at [facility]   Report Status 06/27/2013 FINAL   Final  Studies:  Recent x-ray studies have been reviewed in detail by the Attending Physician  Scheduled Meds:  Scheduled Meds: . albuterol  2.5 mg Nebulization QID   And  . ipratropium  0.5 mg Nebulization QID  . antiseptic oral rinse  15 mL Mouth Rinse q12n4p  . aspirin EC  81 mg Oral Daily  . budesonide (PULMICORT) nebulizer solution  0.25 mg Nebulization BID  . dextromethorphan-guaiFENesin  1 tablet Oral BID  . enoxaparin (LOVENOX) injection  40 mg Subcutaneous Q24H  . fluticasone  2 spray Each Nare Daily  . insulin aspart  0-15 Units Subcutaneous TID WC  . insulin aspart  0-5 Units Subcutaneous QHS  . insulin aspart  6 Units Subcutaneous TID WC  . insulin glargine  20 Units Subcutaneous QHS  . levofloxacin  750 mg Oral q1800  . nicotine  21 mg Transdermal Daily  . nystatin  5 mL Oral QID  . oxyCODONE  10 mg Oral TID  . pantoprazole  40 mg Oral BID  . polyethylene glycol  17 g Oral BID  . [START ON 07/01/2013] predniSONE  50 mg Oral Q breakfast  . senna  2 tablet Oral Daily  . sertraline  50 mg Oral Daily  . spironolactone  25 mg Oral BID  . torsemide  100 mg Oral BID    Time spent on care of this patient: 25 mins   Asta Corbridge, MD Pager: [phone] [facility]  If 7PM-7AM, please contact night-coverage [email or web address] Password [email or web address] 06/30/2013, 2:59 PM   LOS: 6 days

## 2013-06-30 NOTE — Progress Notes (Deleted)
   CARE MANAGEMENT NOTE 06/30/2013  Patient:  [name]   Account Number:  [id number]  Date Initiated:  06/29/2013  Documentation initiated by:  [facility]  Subjective/Objective Assessment:   cerebral palsy     Action/Plan:   Anticipated DC Date:  06/29/2013   Anticipated DC Plan:  HOME W HOME HEALTH SERVICES      DC Planning Services  CM consult      [facility] Choice  HOME HEALTH   Choice offered to / List presented to:  C-6 Parent   DME arranged  OTHER - SEE COMMENT      DME agency  [facility]     HH arranged  HH-1 RN      [facility] agency  [facility]   Status of service:  Completed, signed off Medicare Important Message given?   (If response is "NO", the following Medicare IM given date fields will be blank) Date Medicare IM given:   Date Additional Medicare IM given:    Discharge Disposition:  HOME W HOME HEALTH SERVICES  Per UR Regulation:    If discussed at Long Length of Stay Meetings, dates discussed:    Comments:  06/30/2013 1640 Contacted [facility] and they have not received. The referral has been faxed 3x with confirmation it was received. NCM contacted Tresa Endo with [facility]. She states she will follow up and requested info be faxed directly to her. Received message from [place] and she is working with [employer] to [employer]. Waiting for confirmation from [facility] that [facility] are set up. [facility] was notified of pt's dc home. They are providing [facility] RN. Isidoro Donning RN CCM Case Mgmt phone [phone]  06/29/13 Pt currently receives round-the-clock care from [facility] who will continue to provide daily care. CM spoke to mother of patient, [name] [phone] to offer choice.  [name] chose [facility] for [facility] (for education and eval).  Referral was called to [facility] at [facility].  Pt will need continuous feeding which will be provided by [facility] special[facility].  Jacki Cones from [facility] will refer to the intake team on Monday when  the office is opened.  Jacki Cones is with [facility], [phone].  [name] requested I fax facesheet, H&P, Discharge summary, Nutritional consult, HH nutritional orders to [phone].  Info faxed to aforementioned as requested. CM called [name] to notify her of [facility] arrangements.  No other CM needs were communicated.  Freddy Jaksch, [facility]

## 2013-06-30 NOTE — Progress Notes (Signed)
Pt's O2sat 91% sitting edge of bed at rest on 3L O2 [place]. While ambulating to the bathroom door and back on oxygen, O2sat dropped to 83%. After 2 minutes at rest on edge of bed, sat improved to 94%.

## 2013-06-30 NOTE — Telephone Encounter (Signed)
Faxed script back to [facility].../lmb 

## 2013-06-30 NOTE — Progress Notes (Signed)
Patient tearful at beginning of shift with family visit and phone calls. States she is tired. Respiratory adjusts CPAP for patient. Patient has slept through night.

## 2013-06-30 NOTE — Progress Notes (Signed)
RT placed patient on cpap 12cmH20 via nasal mask per patient request with 3lpm 02 bleed in. Patient is tolerating cpap well at this time.

## 2013-07-01 ENCOUNTER — Inpatient Hospital Stay (HOSPITAL_COMMUNITY): Payer: PRIVATE HEALTH INSURANCE

## 2013-07-01 ENCOUNTER — Other Ambulatory Visit: Payer: Self-pay | Admitting: Internal Medicine

## 2013-07-01 LAB — CULTURE, BLOOD (ROUTINE X 2): Culture: NO GROWTH

## 2013-07-01 LAB — CREATININE, SERUM
Creatinine, Ser: 1.12 mg/dL — ABNORMAL HIGH (ref 0.50–1.10)
GFR calc Af Amer: 67 mL/min — ABNORMAL LOW (ref >90)
GFR calc non Af Amer: 58 mL/min — ABNORMAL LOW (ref >90)

## 2013-07-01 LAB — GLUCOSE, CAPILLARY
Glucose-Capillary: 207 mg/dL — ABNORMAL HIGH (ref 70–99)
Glucose-Capillary: 144 mg/dL — ABNORMAL HIGH (ref 70–99)

## 2013-07-01 MED ORDER — FLUCONAZOLE 200 MG PO TABS
200.0000 mg | ORAL_TABLET | Freq: Once | ORAL | Status: AC
  Administered 2013-07-01: 13:00:00 200 mg via ORAL
  Filled 2013-07-01: qty 1

## 2013-07-01 NOTE — Consult Note (Signed)
PULMONARY  / CRITICAL CARE MEDICINE  Name: [name] MRN: [id number] DOB: May 16, 1966    ADMISSION DATE:  06/24/2013 CONSULTATION DATE:  07/01/13  REFERRING MD :  Dr. Waymon Amato PRIMARY SERVICE:  [facility]  CHIEF COMPLAINT:  Respiratory Failure  BRIEF PATIENT DESCRIPTION: 47 y/o AAF admitted on 9/30 per [facility] with acute on chronic resp fx, concerns for CAP and AECOPD.  Slow to resolve despite inpatient Rx, PCCM consulted for further evaluation.    SIGNIFICANT EVENTS / STUDIES:  9/30 - Admit to [facility] with acute on chronic resp fx, concerns for CAP and AECOPD.   CULTURES: UC 10/1>>>neg BCx2 10/1>>>neg   ANTIBIOTICS: Vanco 9/30>>>10/1 Levaquin 9/30>>>  HISTORY OF PRESENT ILLNESS:  47 y/o AAF with PMH of HTN, HLD, Anxiety, Diastolic CHF, Morbid Obesity, DM, Pulmonary Sarcoidosis, OSA (wearing bilevel compliantly), O2 dependent COPD who was last seen in Pulmonary office by Dr. Shelle Iron on 9/5 for routine follow up of COPD / OSA and was stable at that time.  Patient at that time was continuing to struggle with smoking cessation.  She later called the pulmonary office on 9/29 with reports of increased DOE, runny nose, wheezing, decreased energy and chest congestion.  She was prescribed omnicef & prednisone taper.  She reports she had not started the prescribed medications and presented to the ER on 9/30 with worsening of symptoms.  Patient was admitted per [facility] with acute on chronic resp fx, concerns for CAP and AECOPD.  She has been treated with scheduled nebulized bronchodilators, inhaled budesonide, mucinex, IV antibiotics and IV steroids.  Patient has had continued cough and slow resolution of symptoms.  Continues to report dry cough, hoarse voice, oral thrush and decreased energy levels.  Patient denies fevers, chills, n/v/d, chest tightness.  PCCM consulted for further evaluation.  PAST MEDICAL HISTORY :  Past Medical History  Diagnosis Date  . Sleep apnea     noncompliant w/ CPAP  .  Hypertension   . Hyperlipidemia   . Chronic headache   . Fibromyalgia     daily narcotics  . Anxiety     hx chronic BZ use, stopped 07/2010  . Anemia   . Pulmonary sarcoidosis     unimpressive CT chest 2011  . Colonic polyp   . GERD (gastroesophageal reflux disease)   . ALLERGIC RHINITIS   . Asthma   . CHF (congestive heart failure)     Diastolic with fluid overload, May, 2012, LVEF 60%  . Morbid obesity   . Depression   . Panic attacks   . Diabetes mellitus   . COPD (chronic obstructive pulmonary disease)     on home O2, moderate airflow obstruction, suspect d/t emphysema  . Obesity   . Elevated LFTs 09/2011  . Ovarian cyst   . Chronic back pain    Past Surgical History  Procedure Laterality Date  . Polypectomy  2011  . Lumbar microdiscectomy  07/06/2011    R L4-5, stern  . Back surgery    . Carpal tunnel release    . Steroid spinal injections    . Hysteroscopy w/d&c N/A 03/25/2013    Procedure: DILATATION AND CURETTAGE /HYSTEROSCOPY;  Surgeon: Lavina Hamman, MD;  Location: [facility];  Service: Gynecology;  Laterality: N/A;  . Dilation and curettage of uterus     Prior to Admission medications   Medication Sig Start Date End Date Taking? Authorizing Provider  albuterol (PROVENTIL HFA;VENTOLIN HFA) 108 (90 BASE) MCG/ACT inhaler Inhale 2 puffs into the lungs every 6 (six)  hours as needed. For wheeze or shortness of breath 09/04/12  Yes Richarda Overlie, MD  albuterol (PROVENTIL) (2.5 MG/3ML) 0.083% nebulizer solution USE 1 VIAL VIA NEBULIZER EVERY 6 HOURS AS NEEDED FOR WHEEZE OR SHORTNESS OF BREATH 06/06/13  Yes Newt Lukes, MD  aspirin EC 81 MG tablet Take 1 tablet (81 mg total) by mouth daily. 06/06/13  Yes Newt Lukes, MD  budesonide-formoterol (SYMBICORT) 160-4.5 MCG/ACT inhaler Inhale 2 puffs into the lungs 2 (two) times daily. 09/04/12  Yes Richarda Overlie, MD  cefdinir (OMNICEF) 300 MG capsule Take 2 capsules (600 mg total) by mouth every morning. 06/23/13  Yes Barbaraann Share, MD  cetirizine (ZYRTEC) 1 MG/ML syrup TAKE 2 TEASPOONFULS(10ML) BY MOUTH AT BEDTIME 05/07/13  Yes Nicki Reaper, NP  esomeprazole (NEXIUM) 40 MG capsule Take 1 capsule (40 mg total) by mouth 2 (two) times daily. 01/23/13  Yes Newt Lukes, MD  gabapentin (NEURONTIN) 300 MG capsule Take 300 mg by mouth daily as needed. Take 1-2 tablets by mouth prn if nerve pain is real bad she will take two tablets   Yes Historical Provider, MD  glimepiride (AMARYL) 1 MG tablet Take 1 mg by mouth daily before breakfast.   Yes Historical Provider, MD  LANTUS SOLOSTAR 100 UNIT/ML SOPN INJECT 16 UNITS INTO THE SKIN AT BEDTIME 03/28/13  Yes Nicki Reaper, NP  mometasone (NASONEX) 50 MCG/ACT nasal spray Place 2 sprays into the nose daily. 06/06/13  Yes Newt Lukes, MD  Oxycodone HCl 10 MG TABS Take 10 mg by mouth 3 (three) times daily. Take every day per patient   Yes Historical Provider, MD  potassium chloride SA (K-DUR,KLOR-CON) 20 MEQ tablet Take 2 tablets (40 mEq total) by mouth 3 (three) times daily. 01/23/13  Yes Newt Lukes, MD  predniSONE (DELTASONE) 20 MG tablet Take 20 mg by mouth See admin instructions. Take 2 tabs x 2 days, then 1.5 x 2 days, then 1 x 2 days, then stop. Patient states she is on her second day of prednisone course as of 06-24-13. 06/23/13  Yes Barbaraann Share, MD  sertraline (ZOLOFT) 50 MG tablet Take 1 tablet (50 mg total) by mouth daily. 04/08/13  Yes Newt Lukes, MD  SPIRIVA HANDIHALER 18 MCG inhalation capsule INHALE CONTENTS OF 1 CAPSULE USING HANDIHALER ONCE DAILY 05/28/13  Yes Barbaraann Share, MD  spironolactone (ALDACTONE) 25 MG tablet Take 25 mg by mouth 2 (two) times daily.   Yes Historical Provider, MD  torsemide (DEMADEX) 100 MG tablet Take 100 mg by mouth 2 (two) times daily.   Yes Historical Provider, MD  ALPRAZolam Prudy Feeler) 1 MG tablet TAKE 1 TABLET BY MOUTH TWICE DAILY AS NEEDED 06/30/13   Newt Lukes, MD  tiZANidine (ZANAFLEX) 4 MG tablet TAKE 1 TABLET  BY MOUTH THREE TIMES DAILY AS NEEDED FOR MUSCLE SPASMS 07/01/13   Newt Lukes, MD   Allergies  Allergen Reactions  . Ciprofloxacin     REACTION: nausea  . Latex     REACTION: rash, burning  . Sulfamethoxazole-Trimethoprim     Bactrim REACTION: Projectile Vomiting  . Azithromycin     REACTION: resistant  . Doxycycline     REACTION: restistant  . Metformin And Related Other (See Comments)    Severe diarrhea  . Metronidazole Nausea And Vomiting  . Simvastatin     FAMILY HISTORY:  Family History  Problem Relation Age of Onset  . Hypertension Mother   . Emphysema Father   .  Hypertension Father   . Stomach cancer Father   . Allergies Brother   . Hypertension Brother   . Stomach cancer Brother   . Heart disease Mother   . Heart disease Father   . Heart disease Brother     died age 6 sudden death/MI   SOCIAL HISTORY:  reports that she has been smoking Cigarettes.  She has a 7.25 pack-year smoking history. She has never used smokeless tobacco. She reports that  drinks alcohol. She reports that she uses illicit drugs (Cocaine and Marijuana).  REVIEW OF SYSTEMS:  See HPI for details.  All systems reviewed and otherwise negative.   SUBJECTIVE:   VITAL SIGNS: Temp:  [97.8 F (36.6 C)-98.2 F (36.8 C)] 98.2 F (36.8 C) (10/07 0519) Pulse Rate:  [71-80] 71 (10/07 0519) Resp:  [18-20] 18 (10/07 0519) BP: (126-151)/(74-93) 126/75 mmHg (10/07 0519) SpO2:  [92 %-99 %] 99 % (10/07 0831)  PHYSICAL EXAMINATION: General:  Morbidly obese female in NAD, sitting on side of bed Neuro:  AAOx4, speech clear, MAE HEENT:  Mm pink/moist, white plaques on tongue, posterior pharynx,  Airway wheeze radiation to posterior lung fields Cardiovascular:  s1s2 rrr, no m/r/g Lungs:  resp's even/non-labored, lungs bilaterally with faint bilateral wheeze posterior Abdomen:  Obese/soft, bsx4 active, tol PO's Musculoskeletal:  No acute deformities Skin:  Warm/dry, no edema   Recent Labs Lab  06/25/13 0525 06/28/13 0545 06/30/13 1946 07/01/13 0603  NA 134* 139 137  --   K 4.2 3.9 3.5  --   CL 98 90* 89*  --   CO2 23 32 34*  --   BUN 15 27* 30*  --   CREATININE 0.93 1.18* 1.21* 1.12*  GLUCOSE 198* 194* 183*  --     Recent Labs Lab 06/24/13 1617 06/25/13 0525  HGB 16.0* 13.6  HCT 46.2* 40.7  WBC 8.3 7.1  PLT 236 208   No results found.  ASSESSMENT / PLAN:   Acute on Chronic Hypoxic Respiratory Failure Acute Exacerbation of COPD Tobacco Abuse Pulmonary Sarcoidosis Oral Thrush-cord involvement likely  47 y/o F with slow to resolve acute exacerbation of COPD +/- CAP.  Hx of ongoing tobacco use.  Does not appear to be component of sarcoidosis.  Historically, is slow to resolve when does have exacerbations.  She is at baseline for oxygen requirements with excellent saturations.   Concern vocal cord involvement candida  Plan: -repeat cxr now to eval for evolving process -continue prednisone taper to off over 1 week -recommend 7 days total abx -add diflucan for concern thrush and cord candida -BID PPI -continue scheduled albuterol / atrovent, budesonide -nasal hygiene -oral rinse after inhaled meds -consider restart of home regimen (symbicort / spiriva) prior to discharge to ensure tolerance off inhaled nebs -Rx for thrush, diflucan see above -push PT / mobilize  -follow up arranged with Pulmonary (see discharge section) -ambulatory desats prior to d/c -needs pt consult, will order this  Will revisit in am    Canary Brim, NP-C [facility] Pgr: [phone] or [phone]    07/01/2013, 10:09 AM  I have fully examined this patient and agree with above findings.    And edited in full  Mcarthur Rossetti. Tyson Alias, MD, FACP Pgr: [phone] [facility]

## 2013-07-01 NOTE — Progress Notes (Signed)
[facility] Progress Note    [name] EAV:[id number] DOB: 07-May-1966 DOA: 06/24/2013 PCP: Rene Paci, MD  Admit HPI / Brief Narrative: 47 year old female with history of chronic hypoxic respiratory failure on 3 L O2, history of COPD, pulmonary sarcoidosis, asthma, morbid obesity, obstructive sleep apnea, and diabetes mellitus who presented to ED with progressively worsening shortness of breath for 3 days. Patient reported that her symptoms began a week prior with cough and congestion.  The day before her admit when she woke up, she had significant shortness of breath with wheezing. She called her Pulmonologist who advised her to come to the ER, and also apparently called in omnicef with prednisone taper. Patient reported that she was taking her antibiotics and prednisone with no relief. Stated she  stopped smoking 2 days prior.   In the ER, despite prolonged albuterol nebulizer treatment and Solu-Medrol the patient remained extremely short of breath.  Assessment/Plan:  Acute on chronic hypoxic resp failure/ Possible B basilar CAP - Slow to improve, as is her usual per her report - cont maximal med tx  - added flutter valve to assist with expectoration of heavy phelgm - Continue current management - Transition to oral prednisone on 10/6. Continues to make slow progress and states that her breathing is only 60% better. Hoarse voice. Requested pulmonary consultation-input appreciated. - Completed 7 days of antibiotics. - Chest x-ray 10/7: Improved. Recommend repeating chest x-ray in 4-6 weeks.  Acute exacerbation COPD on home O2 @ 3 L per minute - Continue steroids, oxygen, bronchodilator nebulizations & flutter valve. Completed antibiotics - Tobacco cessation again counseled. - Very slow to improve. - Pulmonary input appreciated.  Reported hx of Pulmonary sarcoidosis - Not felt to be an active issue at this time.  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  HTN Well controlled at present - no change in tx plan today   Fibromyalgia on chronic narcotics Reports no complaints of pain today   GERD On PPI  Obesity  Constipation - Start bowel regimen - had BM  Code Status: FULL Family Communication: no family present at time of exam Disposition Plan: Not medically stable for discharge.  Consultants: Pulmonology  Procedures: none  Antibiotics: Levaquin 9/30 >>10/6 Vancomycin 9/30 >> 10/01  DVT prophylaxis: lovenox  HPI/Subjective: States that her breathing is 60% better. Hoarse voice.   Objective: Blood pressure 137/81, pulse 82, temperature 98.7 F (37.1 C), temperature source Oral, resp. rate 20, height 5' 8.5" (1.74 m), weight 131.9 kg (290 lb 12.6 oz), last menstrual period 10/16/2012, SpO2 94.00%.  Intake/Output Summary (Last 24 hours) at 07/01/13 1508 Last data filed at 07/01/13 1300  Gross per 24 hour  Intake    480 ml  Output    950 ml  Net   -470 ml   Exam: General: Morbidly obese female, in bed, in no obvious distress but does not look as well as she did on 10/6 Lungs: Fair breath sounds bilaterally. Few rhonchi bilaterally but much reduced compared to 48 hours ago. No increased work of breathing. Able to speak in full sentences- Hoarse Cardiovascular: S1 and S2 heard, RRR. No JVD, murmurs or gallops. Trace bilateral ankle edema. Abdomen: Nontender, nondistended, soft, bowel sounds positive, no rebound, no ascites, no appreciable mass Extremities: Symmetric 5 x 5 power. CNS: Alert and oriented. No  focal neurological deficits.  Data Reviewed: Basic Metabolic Panel:  Recent Labs Lab 06/24/13 1617 06/25/13 0525 06/28/13 0545 06/30/13 1946 07/01/13 0603  NA 140 134* 139 137  --   K 3.4* 4.2 3.9 3.5  --   CL 98 98  90* 89*  --   CO2 28 23 32 34*  --   GLUCOSE 138* 198* 194* 183*  --   BUN 11 15 27* 30*  --   CREATININE 0.94 0.93 1.18* 1.21* 1.12*  CALCIUM 9.2 8.6 9.2 9.1  --    Liver Function Tests: No results found for this basename: AST, ALT, ALKPHOS, BILITOT, PROT, ALBUMIN,  in the last 168 hours  CBC:  Recent Labs Lab 06/24/13 1617 06/25/13 0525  WBC 8.3 7.1  HGB 16.0* 13.6  HCT 46.2* 40.7  MCV 78.7 78.4  PLT 236 208   CBG:  Recent Labs Lab 06/30/13 1146 06/30/13 1730 06/30/13 2137 07/01/13 0731 07/01/13 1125  GLUCAP 155* 164* 163* 118* 148*    Recent Results (from the past 240 hour(s))  MRSA PCR SCREENING     Status: None   Collection Time    06/24/13  6:56 PM      Result Value Range Status   MRSA by PCR NEGATIVE  NEGATIVE Final   Comment:            The GeneXpert MRSA Assay (FDA     approved for NASAL specimens     only), is one component of a     comprehensive MRSA colonization     surveillance program. It is not     intended to diagnose MRSA     infection nor to guide or     monitor treatment for     MRSA infections.  CULTURE, BLOOD (ROUTINE X 2)     Status: None   Collection Time    06/25/13 12:40 AM      Result Value Range Status   Specimen Description BLOOD LEFT ARM   Final   Special Requests BOTTLES DRAWN AEROBIC ONLY 10CC   Final   Culture  Setup Time     Final   Value: 06/25/2013 04:39     Performed at [facility]   Culture     Final   Value: NO GROWTH 5 DAYS     Performed at [facility]   Report Status 07/01/2013 FINAL   Final  CULTURE, BLOOD (ROUTINE X 2)     Status: None   Collection Time    06/25/13  1:12 AM      Result Value Range Status   Specimen Description Blood   Final   Special Requests NONE   Final   Culture  Setup Time     Final   Value: 06/25/2013 04:39     Performed at [facility]   Culture     Final   Value: NO GROWTH 5 DAYS     Performed at [facility]   Report Status 07/01/2013 FINAL    Final  URINE CULTURE     Status: None   Collection Time    06/25/13 11:19 AM      Result Value Range Status   Specimen Description URINE, RANDOM   Final   Special Requests NONE   Final   Culture  Setup Time     Final   Value: 06/25/2013 13:40     Performed at [facility] Count     Final  Value: NO GROWTH     Performed at [facility]     Final   Value: NO GROWTH     Performed at [facility]   Report Status 06/27/2013 FINAL   Final     Studies:  Recent x-ray studies have been reviewed in detail by the Attending Physician  Scheduled Meds:  Scheduled Meds: . albuterol  2.5 mg Nebulization QID   And  . ipratropium  0.5 mg Nebulization QID  . antiseptic oral rinse  15 mL Mouth Rinse q12n4p  . aspirin EC  81 mg Oral Daily  . budesonide (PULMICORT) nebulizer solution  0.25 mg Nebulization BID  . dextromethorphan-guaiFENesin  1 tablet Oral BID  . enoxaparin (LOVENOX) injection  40 mg Subcutaneous Q24H  . fluticasone  2 spray Each Nare Daily  . insulin aspart  0-15 Units Subcutaneous TID WC  . insulin aspart  0-5 Units Subcutaneous QHS  . insulin aspart  6 Units Subcutaneous TID WC  . insulin glargine  20 Units Subcutaneous QHS  . levofloxacin  750 mg Oral q1800  . nicotine  21 mg Transdermal Daily  . nystatin  5 mL Oral QID  . oxyCODONE  10 mg Oral TID  . pantoprazole  40 mg Oral BID  . polyethylene glycol  17 g Oral BID  . predniSONE  50 mg Oral Q breakfast  . senna  2 tablet Oral Daily  . sertraline  50 mg Oral Daily  . spironolactone  25 mg Oral BID  . torsemide  100 mg Oral BID    Time spent on care of this patient: 25 mins   Kerrie Timm, MD Pager: [phone] [facility]  If 7PM-7AM, please contact night-coverage [email or web address] Password [email or web address] 07/01/2013, 3:08 PM   LOS: 7 days

## 2013-07-01 NOTE — Progress Notes (Deleted)
Notified MD of increased BP this morning. He will pass along the info to Dr. Claudell Kyle. Will continue to monitor.

## 2013-07-01 NOTE — Evaluation (Signed)
Physical Therapy Re-Evaluation Patient Details Name: [name] MRN: [id number] DOB: 07/05/1966 Today's Date: 07/01/2013 Time: 1500-1520 PT Time Calculation (min): 20 min  PT Assessment / Plan / Recommendation History of Present Illness  Pt presents with increasing dyspnea after URI, now with PNA, acute on chronic respiratory failure in setting of COPD. Pt also with DM.  Clinical Impression  Pt re-evaluated and instructed and practice with use of rollator.  As noted on original eval on 06/28/13 pt's primary limitation is respiratory issues.  Did very well with rollator and feel this would be best option for pt to maximize her mobility and endurance.  No further acute PT needed.  Recommend pt continue mobilizing with nursing staff to manage O2 tank.    PT Assessment  Patent does not need any further PT services    Follow Up Recommendations  No PT follow up    Does the patient have the potential to tolerate intense rehabilitation      Barriers to Discharge        Equipment Recommendations  Other (comment) (rollator (may need bariatric size))    Recommendations for Other Services     Frequency      Precautions / Restrictions Precautions Precautions: None   Pertinent Vitals/Pain See flow sheet.      Mobility  Bed Mobility Bed Mobility: Supine to Sit;Sitting - Scoot to Edge of Bed Supine to Sit: 6: Modified independent (Device/Increase time);HOB elevated Sitting - Scoot to Edge of Bed: 7: Independent Transfers Sit to Stand: 6: Modified independent (Device/Increase time);With upper extremity assist;From bed Stand to Sit: 6: Modified independent (Device/Increase time);With upper extremity assist;To bed Ambulation/Gait Ambulation Distance (Feet): 250 Feet (with one sitting rest break on rollator seat.) Assistive device: 4-wheeled walker (bariatric rollator) Ambulation/Gait Assistance Details: Instructed pt on use of rollator including how to lock and unlock brakes and to  make sure brakes are locked before sitting on rollator seat or coming to stand from rollator seat. Gait Pattern: Step-through pattern Gait velocity: decreased General Gait Details: Pt able to demonstrate good use of rollator and ability to sit and take a rest break on rollator.    Exercises     PT Diagnosis:    PT Problem List:   PT Treatment Interventions:       PT Goals(Current goals can be found in the care plan section) Acute Rehab PT Goals Patient Stated Goal: return home PT Goal Formulation: No goals set, d/c therapy  Visit Information  Last PT Received On: 07/01/13 Assistance Needed: +1 History of Present Illness: Pt presents with increasing dyspnea after URI, now with PNA, acute on chronic respiratory failure in setting of COPD. Pt also with DM.       Prior Functioning  Home Living Family/patient expects to be discharged to:: Private residence Living Arrangements: Alone Available Help at Discharge: Family;Available PRN/intermittently Type of Home: Apartment Home Access: Level entry Home Layout: One level Home Equipment: Shower seat Additional Comments: pt's sister helps her with grocery shopping, as pt does not drive. She reports that she is going to have help coming in for house cleaning, laundry, etc. She does not have AD and would benefit. Prior Function Level of Independence: Independent Communication Communication: No difficulties    Cognition  Cognition Arousal/Alertness: Awake/alert Behavior During Therapy: WFL for tasks assessed/performed Overall Cognitive Status: Within Functional Limits for tasks assessed    Extremity/Trunk Assessment Upper Extremity Assessment Upper Extremity Assessment: Overall WFL for tasks assessed Lower Extremity Assessment Lower Extremity Assessment: Overall  WFL for tasks assessed   Balance    End of Session PT - End of Session Equipment Utilized During Treatment: Oxygen Activity Tolerance: Patient tolerated treatment  well Patient left: in bed;with call bell/phone within reach Nurse Communication: Mobility status;Other (comment) (O2 sats)  GP     Haylyn Halberg 07/01/2013, 3:56 PM  [facility] PT [phone]

## 2013-07-01 NOTE — Care Management Note (Addendum)
    Page 1 of 1   07/02/2013     11:31:52 AM   CARE MANAGEMENT NOTE 07/02/2013  Patient:  [name], [name]   Account Number:  [id number]  Date Initiated:  07/01/2013  Documentation initiated by:  Letha Cape  Subjective/Objective Assessment:   dx acute on chronic resp failure  admit-lives alone.  patient has home oxygen with [employer].     Action/Plan:   pt eval- no pt f/u, but patient needs bariatric rollator.   Anticipated DC Date:  07/02/2013   Anticipated DC Plan:  HOME/SELF CARE      DC Planning Services  CM consult      Choice offered to / List presented to:             Status of service:  Completed, signed off Medicare Important Message given?   (If response is "NO", the following Medicare IM given date fields will be blank) Date Medicare IM given:   Date Additional Medicare IM given:    Discharge Disposition:  HOME/SELF CARE  Per UR Regulation:  Reviewed for med. necessity/level of care/duration of stay  If discussed at Long Length of Stay Meetings, dates discussed:    Comments:  07/02/13 11:22 Letha Cape RN, BSN [phone] patient is for dc today, patient has transportation home and medication coverage.  Patient states she will need an oxygen tank from [employer] to go home with. I called [employer] and they will have a driver bring a tank for the patient and they also will get her tanks refilled at home.  Patient also has an order for a barriatric rollator.  07/01/13 17:42 Letha Cape RN, BSN  [phone] patient lives alone, per phsycial therapy recs no pt f/u, but she will need a bariatric rollator, I printed order out for patient to take to [facility] store at [employer], informed  [place] the RN following patient, she will give patient the order.

## 2013-07-02 DIAGNOSIS — J9620 Acute and chronic respiratory failure, unspecified whether with hypoxia or hypercapnia: Secondary | ICD-10-CM

## 2013-07-02 LAB — GLUCOSE, CAPILLARY
Glucose-Capillary: 124 mg/dL — ABNORMAL HIGH (ref 70–99)
Glucose-Capillary: 150 mg/dL — ABNORMAL HIGH (ref 70–99)

## 2013-07-02 MED ORDER — NYSTATIN 100000 UNIT/ML MT SUSP: 5.0000 mL | Freq: Four times a day (QID) | OROMUCOSAL | Status: DC

## 2013-07-02 MED ORDER — NICOTINE 21 MG/24HR TD PT24: 1.0000 | MEDICATED_PATCH | Freq: Every day | TRANSDERMAL | Status: DC

## 2013-07-02 MED ORDER — INSULIN ASPART 100 UNIT/ML ~~LOC~~ SOLN: SUBCUTANEOUS | Status: DC

## 2013-07-02 MED ORDER — PREDNISONE 5 MG PO TABS: ORAL_TABLET | ORAL | Status: DC

## 2013-07-02 MED ORDER — FLUCONAZOLE 100 MG PO TABS
100.0000 mg | ORAL_TABLET | Freq: Every day | ORAL | Status: DC
Start: 2013-07-02 — End: 2013-07-02
  Administered 2013-07-02: 100 mg via ORAL
  Filled 2013-07-02: qty 1

## 2013-07-02 NOTE — Progress Notes (Signed)
PULMONARY  / CRITICAL CARE MEDICINE  Name: [name] MRN: [id number] DOB: [birth year]    ADMISSION DATE:  06/24/2013 CONSULTATION DATE:  07/01/13  REFERRING MD :  Dr. Waymon Amato PRIMARY SERVICE:  [facility]  CHIEF COMPLAINT:  Respiratory Failure  BRIEF PATIENT DESCRIPTION: 47 y/o AAF admitted on 9/30 per [facility] with acute on chronic resp fx, concerns for CAP and AECOPD.  Slow to resolve despite inpatient Rx, PCCM consulted for further evaluation.    SIGNIFICANT EVENTS / STUDIES:  9/30 - Admit to [facility] with acute on chronic resp fx, concerns for CAP and AECOPD. 10/7 - PCCM consulted for slow resp recovery  CULTURES: UC 10/1>>>neg BCx2 10/1>>>neg  ANTIBIOTICS: Vanco 9/30>>>10/1 Levaquin 9/30>>>   SUBJECTIVE: Pt reports feeling better, voice improved  VITAL SIGNS: Temp:  [97.7 F (36.5 C)-98.7 F (37.1 C)] 97.7 F (36.5 C) (10/08 0523) Pulse Rate:  [70-86] 70 (10/08 0523) Resp:  [20-22] 20 (10/08 0523) BP: (131-140)/(69-94) 131/94 mmHg (10/08 0523) SpO2:  [89 %-98 %] 96 % (10/08 0859)  PHYSICAL EXAMINATION: General:  Morbidly obese female in NAD, sitting on side of bed Neuro:  AAOx4, speech clear, MAE HEENT:  Mm pink/moist, white plaques on tongue, posterior pharynx Cardiovascular:  s1s2 rrr, no m/r/g Lungs:  resp's even/non-labored, lungs bilaterally clear / no further wheeze Abdomen:  Obese/soft, bsx4 active, tol PO's Musculoskeletal:  No acute deformities Skin:  Warm/dry, no edema   Recent Labs Lab 06/28/13 0545 06/30/13 1946 07/01/13 0603  NA 139 137  --   K 3.9 3.5  --   CL 90* 89*  --   CO2 32 34*  --   BUN 27* 30*  --   CREATININE 1.18* 1.21* 1.12*  GLUCOSE 194* 183*  --    No results found for this basename: HGB, HCT, WBC, PLT,  in the last 168 hours Dg Chest 2 View  07/01/2013   CLINICAL DATA:  Subsequent encounter for COPD exacerbation with shortness of breath and bibasilar atelectasis versus pneumonia.  EXAM: CHEST  2 VIEW  COMPARISON:  Portable chest  x-ray 06/24/2013. Two view chest x-ray 01/05/2013, 10/21/2012, 09/24/2012.  FINDINGS: Cardiac silhouette mildly enlarged but stable. Hilar and mediastinal contours otherwise unremarkable. Low lung volumes and chronic interstitial disease throughout both lungs, unchanged since April, 2014 but progressive since December, 2013. Improved aeration in the lower lobes since the examination 9 days ago, though mild streaky and patchy opacities persist. No new pulmonary parenchymal abnormalities. No visible pleural effusions. Mild degenerative changes involving the lower thoracic spine.  IMPRESSION: 1. Improved atelectasis and/or pneumonia in the lower lobes since the examination 9 days ago, though mild streaky and patchy opacities persist. 2. Diffuse interstitial lung disease associated with low lung volumes, query interstitial fibrosis. Findings are stable since April, 2014 but progressive since December, 2013. 3. No new abnormalities.   Electronically Signed   By: Hulan Saas M.D.   On: 07/01/2013 13:38    ASSESSMENT / PLAN:   Acute on Chronic Hypoxic Respiratory Failure Acute Exacerbation of COPD Tobacco Abuse Pulmonary Sarcoidosis Oral Thrush - cord involvement likely  47 y/o F with slow to resolve acute exacerbation of COPD +/- CAP.  Hx of ongoing tobacco use.  Does not appear to be component of sarcoidosis.  Historically, is slow to resolve when does have exacerbations.  She is at baseline for oxygen requirements with excellent saturations.  Concern vocal cord involvement candida.   Plan: -cxr improved 10/7 -continue prednisone taper to off over 1 week -recommend  7 days total abx -diflucan x1 for concern thrush and cord candida- likely improved her status -BID PPI -continue scheduled albuterol / atrovent, budesonide -nasal hygiene -oral rinse after inhaled meds -consider restart of home regimen (symbicort / spiriva) prior to discharge to ensure tolerance off inhaled nebs -push PT / mobilize   -follow up arranged with Pulmonary (see discharge section) -ambulatory desats prior to d/c   OK for D/c from pulmonary standpoint.  PCCM will be available PRN. Please call for needs / questions.    Canary Brim, NP-C [facility] Pgr: [phone] or [phone]  I have fully examined this patient and agree with above findings.    And edite dinfull  Mcarthur Rossetti. Tyson Alias, MD, FACP Pgr: [phone] [facility]  07/02/2013, 9:30 AM

## 2013-07-02 NOTE — Progress Notes (Signed)
[name] to be D/C'd Home per MD order.  Discussed with the patient and all questions fully answered.    Medication List    STOP taking these medications       cefdinir 300 MG capsule  Commonly known as:  OMNICEF      TAKE these medications       albuterol 108 (90 BASE) MCG/ACT inhaler  Commonly known as:  PROVENTIL HFA;VENTOLIN HFA  Inhale 2 puffs into the lungs every 6 (six) hours as needed. For wheeze or shortness of breath     albuterol (2.5 MG/3ML) 0.083% nebulizer solution  Commonly known as:  PROVENTIL  USE 1 VIAL VIA NEBULIZER EVERY 6 HOURS AS NEEDED FOR WHEEZE OR SHORTNESS OF BREATH     ALPRAZolam 1 MG tablet  Commonly known as:  XANAX  TAKE 1 TABLET BY MOUTH TWICE DAILY AS NEEDED     aspirin EC 81 MG tablet  Take 1 tablet (81 mg total) by mouth daily.     budesonide-formoterol 160-4.5 MCG/ACT inhaler  Commonly known as:  SYMBICORT  Inhale 2 puffs into the lungs 2 (two) times daily.     cetirizine 1 MG/ML syrup  Commonly known as:  ZYRTEC  TAKE 2 TEASPOONFULS(10ML) BY MOUTH AT BEDTIME     esomeprazole 40 MG capsule  Commonly known as:  NEXIUM  Take 1 capsule (40 mg total) by mouth 2 (two) times daily.     gabapentin 300 MG capsule  Commonly known as:  NEURONTIN  Take 300 mg by mouth daily as needed. Take 1-2 tablets by mouth prn if nerve pain is real bad she will take two tablets     glimepiride 1 MG tablet  Commonly known as:  AMARYL  Take 1 mg by mouth daily before breakfast.     insulin aspart 100 UNIT/ML injection  Commonly known as:  novoLOG  - Before each meal 3 times a day, 140-199 - 2 units, 200-250 - 4 units, 251-299 - 6 units,  300-349 - 8 units,  350 or above 10 units.  - Insulin PEN if approved, provide syringes and needles if needed.     LANTUS SOLOSTAR 100 UNIT/ML Sopn  Generic drug:  Insulin Glargine  INJECT 16 UNITS INTO THE SKIN AT BEDTIME     mometasone 50 MCG/ACT nasal spray  Commonly known as:  NASONEX  Place 2 sprays  into the nose daily.     nicotine 21 mg/24hr patch  Commonly known as:  NICODERM CQ - dosed in mg/24 hours  Place 1 patch onto the skin daily.     nystatin 100000 UNIT/ML suspension  Commonly known as:  MYCOSTATIN  Take 5 mLs (500,000 Units total) by mouth 4 (four) times daily.     Oxycodone HCl 10 MG Tabs  Take 10 mg by mouth 3 (three) times daily. Take every day per patient     potassium chloride SA 20 MEQ tablet  Commonly known as:  K-DUR,KLOR-CON  Take 2 tablets (40 mEq total) by mouth 3 (three) times daily.     predniSONE 5 MG tablet  Commonly known as:  DELTASONE  Label  & dispense according to the schedule below. 10 Pills PO for 3 days then, 8 Pills PO for 3 days, 6 Pills PO for 3 days, 4 Pills PO for 3 days, 2 Pills PO for 3 days, 1 Pills PO for 3 days, 1/2 Pill  PO for 3 days then STOP.     sertraline 50 MG  tablet  Commonly known as:  ZOLOFT  Take 1 tablet (50 mg total) by mouth daily.     SPIRIVA HANDIHALER 18 MCG inhalation capsule  Generic drug:  tiotropium  INHALE CONTENTS OF 1 CAPSULE USING HANDIHALER ONCE DAILY     spironolactone 25 MG tablet  Commonly known as:  ALDACTONE  Take 25 mg by mouth 2 (two) times daily.     tiZANidine 4 MG tablet  Commonly known as:  ZANAFLEX  TAKE 1 TABLET BY MOUTH THREE TIMES DAILY AS NEEDED FOR MUSCLE SPASMS     torsemide 100 MG tablet  Commonly known as:  DEMADEX  Take 100 mg by mouth 2 (two) times daily.        VVS, Skin clean, dry and intact without evidence of skin break down, no evidence of skin tears noted. IV catheter discontinued intact. Site without signs and symptoms of complications. Dressing and pressure applied.  An After Visit Summary was printed and given to the patient. Patient escorted via WC, and D/C home via private auto.  [name] 07/02/2013 1:32 PM

## 2013-07-02 NOTE — Progress Notes (Signed)
PT Cancellation Note  Patient Details Name: [name] MRN: [id number] DOB: 04-28-1966   Cancelled Treatment:    Reason Eval Not Completed: PT screened, no needs identified, will sign off. Please see previous PT evals 10/04 and 10/07.   Maximum Reiland 07/02/2013, 10:31 AM Pager [phone]

## 2013-07-02 NOTE — Discharge Summary (Signed)
Physician Discharge Summary  [name] ZOX:[id number] DOB: 03-08-1966 DOA: 06/24/2013  PCP: Rene Paci, MD  Admit date: 06/24/2013 Discharge date: 07/02/2013  Time spent: 60 minutes  Recommendations for Outpatient Follow-up:  1. Pulmonology follow up in 1 week 2. Follow up CXR in 4-6 weeks to confirm resolution of pneumonia 3. Follow up with PCP in 3 days for repeat CBC, CMP   Discharge Diagnoses:  Principal Problem:   Acute-on-chronic respiratory failure Active Problems:   PULMONARY SARCOIDOSIS   Morbid obesity   HYPERTENSION   Chronic respiratory failure   GERD   OBSTRUCTIVE SLEEP APNEA   Obesity hypoventilation syndrome   COPD with acute exacerbation   Follow-up Information   Follow up with PARRETT,TAMMY, NP On 07/08/2013. (PULMONOLOGY-- Appt at 4 PM)    Specialty:  Nurse Practitioner   Contact information:   [address] [place] [address] [phone]       Follow up with Rene Paci, MD In 3 days.   Specialty:  Internal Medicine   Contact information:   [address] [place] [address] [phone]       Discharge Condition: Good, patient expressing desire to be discharged.   Discharge Instructions:  Follow with Primary MD Rene Paci, MD in 3 days   Get CBC, CMP, checked 3 days by Primary MD and again as instructed by your Primary MD. Get a 2 view Chest X ray done in a few weeks.  Get Medicines reviewed and adjusted.  Accuchecks 4 times/day, Once in AM empty stomach and then before each meal. Log in all results and show them to your Prim.MD in 3 days. If any glucose reading is under 80 or above 300 call your Prim MD immidiately. Follow Low glucose instructions for glucose under 80 as instructed.   Please request your Prim.MD to go over all Hospital Tests and Procedure/Radiological results at the follow up, please get all Hospital records sent to your Prim MD by signing hospital release  before you go home.  Activity: As tolerated with Full fall precautions use walker/cane & assistance as needed   Diet:  Heart healthy low carbohydrate  Check your Weight same time everyday, if you gain over 2 pounds, or you develop in leg swelling, experience more shortness of breath or chest pain, call your Primary MD immediately. Follow Cardiac Low Salt Diet and 1.8 lit/day fluid restriction.  Disposition Home   If you experience worsening of your admission symptoms, develop shortness of breath, life threatening emergency, suicidal or homicidal thoughts you must seek medical attention immediately by calling 911 or calling your MD immediately  if symptoms less severe.  You Must read complete instructions/literature along with all the possible adverse reactions/side effects for all the Medicines you take and that have been prescribed to you. Take any new Medicines after you have completely understood and accpet all the possible adverse reactions/side effects.   Do not drive and provide baby sitting services if your were admitted for syncope or siezures until you have seen by Primary MD or a Neurologist and advised to do so again.  Do not drive when taking Pain medications.    Do not take more than prescribed Pain, Sleep and Anxiety Medications  Special Instructions: If you have smoked or chewed Tobacco  in the last 2 yrs please stop smoking, stop any regular Alcohol  and or any Recreational drug use.  Wear Seat belts while driving.   Please note  You were cared for by a hospitalist during your hospital stay. If you have any questions about your discharge medications or the care you received while you were in the hospital after you are discharged, you can call the unit and asked to speak with the hospitalist on call if the hospitalist that took care of you is not available. Once you are discharged, your primary care physician will handle any further medical issues. Please note that NO  REFILLS for any discharge medications will be authorized once you are discharged, as it is imperative that you return to your primary care physician (or establish a relationship with a primary care physician if you do not have one) for your aftercare needs so that they can reassess your need for medications and monitor your lab values.     Filed Weights   06/25/13 0400 06/26/13 0421 06/27/13 2150  Weight: 132.2 kg (291 lb 7.2 oz) 133 kg (293 lb 3.4 oz) 131.9 kg (290 lb 12.6 oz)   Diet recommendation: Carb modified and heart healthy.   History of present illness:  47 year old female with history of chronic hypoxic respiratory failure on 3 L O2, history of COPD, pulmonary sarcoidosis, asthma, morbid obesity, obstructive sleep apnea, and diabetes mellitus who presented to ED with progressively worsening shortness of breath for 3 days. Patient reported that her symptoms began a week prior with cough and congestion. The day before her admit when she woke up, she had significant shortness of breath with wheezing. She called her Pulmonologist who advised her to go to the ER. Pt. Failed outpatient therapy with omnicef with prednisone taper reporting that she was taking her antibiotics and prednisone with no relief.  Patient is much better today and expresses desire to be discharged.     Hospital Course:    Acute on chronic hypoxic resp failure/ Possible B basilar CAP  - Slow to improve, as is her usual per her report  - Added flutter valve to assist with expectoration of heavy phelgm  - Managed with Levaquin x 7d,  IV Solu-Medrol x 7 days - Improving; Transitioned to oral prednisone on 10/6 for 7day taper to be completed on 10/12.    Acute exacerbation COPD on home O2 @ 3 L per minute  - Antibiotics, steroids, oxygen, bronchodilator nebulizations & flutter valve  - Tobacco cessation again counseled.  - Continued to gradually improve.  - Ambulatory O2 sat check before discharge was >93% -  Case manager to arrange O2 prescriptions/refills for at home O2    Oral Candida infection - Started po Diflucan tablet on 10/7 - Continue for 7 days    Tobacco abuse  - Repeatedly counseled regarding cessation - she reports understanding and desire to do so  - Will prescribe nicotine patches at discharge    Sleep apnea  - Cont home CPAP regimen     DM  - Poorly controled while on steroids; daily insulin adjustment needed while inpatient - Closely monitor CBGs at home while finishing steroid taper - Well controlled before exacerbation; Hgb A1c 6.2 on 9/30 - On Amaryl 1mg  and Lantus 100unit at home added sliding scale novolog as she will be on steroids    HTN  - Well controlled at present - no change in tx plan  - Continue Aldactone 25mg  and Demadex 100mg  upon discharge    Fibromyalgia on longterm narcotics  - Reports no complaints of pain today  - Oxycodone 10mg     GERD  - On PPI; continue home  regimen of Nexium 40mg  once day    Obesity  - Discussed healthy eating and diet management due to DM and obesity    Constipation  - Started bowel regimen - had BM - On Miralax at home; con't upon discharge    DVT Prophylaxis: Lovenox  Antibiotics:  Levaquin 9/30 >>10/6  Vancomycin 9/30 >> 10/01   Consultations:  Pulmonology- Dr. Tyson Alias  Discharge Exam: Filed Vitals:   07/02/13 0523  BP: 131/94  Pulse: 70  Temp: 97.7 F (36.5 C)  Resp: 20    General: Well appearing, sitting upright in bed eating breakfast, NAD or discomfort Cardiovascular: RRR, no m/g/r, normal S1/S2 Respiratory: Diffuse rhonchi heard bilaterally more so in the lower lobes, no difficulty breathing, no accessory muscle use Abdomen: Soft, nontender, no palpable masses, no organomegaly, BS+ Musculoskeletal: Full pain free ROM in all extremities, mild edema in bilateral ankles Neuro: NFD, AAO x 3, Intact sensation in distal extremities  Discharge Instructions      Discharge  Orders   Future Appointments Provider Department Dept Phone   07/08/2013 4:00 PM Julio Sicks, NP [facility] [phone]   Future Orders Complete By Expires   Diet - low sodium heart healthy  As directed    Scheduling Instructions:     Low carbohydrate   Increase activity slowly  As directed        Medication List    STOP taking these medications       cefdinir 300 MG capsule  Commonly known as:  OMNICEF      TAKE these medications       albuterol 108 (90 BASE) MCG/ACT inhaler  Commonly known as:  PROVENTIL HFA;VENTOLIN HFA  Inhale 2 puffs into the lungs every 6 (six) hours as needed. For wheeze or shortness of breath     albuterol (2.5 MG/3ML) 0.083% nebulizer solution  Commonly known as:  PROVENTIL  USE 1 VIAL VIA NEBULIZER EVERY 6 HOURS AS NEEDED FOR WHEEZE OR SHORTNESS OF BREATH     ALPRAZolam 1 MG tablet  Commonly known as:  XANAX  TAKE 1 TABLET BY MOUTH TWICE DAILY AS NEEDED     aspirin EC 81 MG tablet  Take 1 tablet (81 mg total) by mouth daily.     budesonide-formoterol 160-4.5 MCG/ACT inhaler  Commonly known as:  SYMBICORT  Inhale 2 puffs into the lungs 2 (two) times daily.     cetirizine 1 MG/ML syrup  Commonly known as:  ZYRTEC  TAKE 2 TEASPOONFULS(10ML) BY MOUTH AT BEDTIME     esomeprazole 40 MG capsule  Commonly known as:  NEXIUM  Take 1 capsule (40 mg total) by mouth 2 (two) times daily.     gabapentin 300 MG capsule  Commonly known as:  NEURONTIN  Take 300 mg by mouth daily as needed. Take 1-2 tablets by mouth prn if nerve pain is real bad she will take two tablets     glimepiride 1 MG tablet  Commonly known as:  AMARYL  Take 1 mg by mouth daily before breakfast.     insulin aspart 100 UNIT/ML injection  Commonly known as:  novoLOG  - Before each meal 3 times a day, 140-199 - 2 units, 200-250 - 4 units, 251-299 - 6 units,  300-349 - 8 units,  350 or above 10 units.  - Insulin PEN if approved, provide syringes and needles if  needed.     LANTUS SOLOSTAR 100 UNIT/ML Sopn  Generic drug:  Insulin Glargine  INJECT  16 UNITS INTO THE SKIN AT BEDTIME     mometasone 50 MCG/ACT nasal spray  Commonly known as:  NASONEX  Place 2 sprays into the nose daily.     nicotine 21 mg/24hr patch  Commonly known as:  NICODERM CQ - dosed in mg/24 hours  Place 1 patch onto the skin daily.     nystatin 100000 UNIT/ML suspension  Commonly known as:  MYCOSTATIN  Take 5 mLs (500,000 Units total) by mouth 4 (four) times daily.     Oxycodone HCl 10 MG Tabs  Take 10 mg by mouth 3 (three) times daily. Take every day per patient     potassium chloride SA 20 MEQ tablet  Commonly known as:  K-DUR,KLOR-CON  Take 2 tablets (40 mEq total) by mouth 3 (three) times daily.     predniSONE 5 MG tablet  Commonly known as:  DELTASONE  Label  & dispense according to the schedule below. 10 Pills PO for 3 days then, 8 Pills PO for 3 days, 6 Pills PO for 3 days, 4 Pills PO for 3 days, 2 Pills PO for 3 days, 1 Pills PO for 3 days, 1/2 Pill  PO for 3 days then STOP.     sertraline 50 MG tablet  Commonly known as:  ZOLOFT  Take 1 tablet (50 mg total) by mouth daily.     SPIRIVA HANDIHALER 18 MCG inhalation capsule  Generic drug:  tiotropium  INHALE CONTENTS OF 1 CAPSULE USING HANDIHALER ONCE DAILY     spironolactone 25 MG tablet  Commonly known as:  ALDACTONE  Take 25 mg by mouth 2 (two) times daily.     tiZANidine 4 MG tablet  Commonly known as:  ZANAFLEX  TAKE 1 TABLET BY MOUTH THREE TIMES DAILY AS NEEDED FOR MUSCLE SPASMS     torsemide 100 MG tablet  Commonly known as:  DEMADEX  Take 100 mg by mouth 2 (two) times daily.       Allergies  Allergen Reactions  . Ciprofloxacin     REACTION: nausea  . Latex     REACTION: rash, burning  . Sulfamethoxazole-Trimethoprim     Bactrim REACTION: Projectile Vomiting  . Azithromycin     REACTION: resistant  . Doxycycline     REACTION: restistant  . Metformin And Related Other (See  Comments)    Severe diarrhea  . Metronidazole Nausea And Vomiting  . Simvastatin    Follow-up Information   Follow up with PARRETT,TAMMY, NP On 07/08/2013. (PULMONOLOGY-- Appt at 4 PM)    Specialty:  Nurse Practitioner   Contact information:   [address] [place] [address] [phone]       Follow up with Barbaraann Share, MD. Schedule an appointment as soon as possible for a visit in 1 week.   Specialty:  Pulmonary Disease   Contact information:   [address] [place] [address] [phone]        The results of significant diagnostics from this hospitalization (including imaging, microbiology, ancillary and laboratory) are listed below for reference.    Significant Diagnostic Studies: Dg Chest 2 View  07/01/2013   CLINICAL DATA:  Subsequent encounter for COPD exacerbation with shortness of breath and bibasilar atelectasis versus pneumonia.  EXAM: CHEST  2 VIEW  COMPARISON:  Portable chest x-ray 06/24/2013. Two view chest x-ray 01/05/2013, 10/21/2012, 09/24/2012.  FINDINGS: Cardiac silhouette mildly enlarged but stable. Hilar and mediastinal contours otherwise unremarkable. Low lung volumes and chronic interstitial disease throughout both lungs,  unchanged since April, 2014 but progressive since December, 2013. Improved aeration in the lower lobes since the examination 9 days ago, though mild streaky and patchy opacities persist. No new pulmonary parenchymal abnormalities. No visible pleural effusions. Mild degenerative changes involving the lower thoracic spine.  IMPRESSION: 1. Improved atelectasis and/or pneumonia in the lower lobes since the examination 9 days ago, though mild streaky and patchy opacities persist. 2. Diffuse interstitial lung disease associated with low lung volumes, query interstitial fibrosis. Findings are stable since April, 2014 but progressive since December, 2013. 3. No new abnormalities.   Electronically Signed   By: Hulan Saas M.D.   On:  07/01/2013 13:38   Dg Chest Port 1 View  06/24/2013   CLINICAL DATA:  Cough, shortness of breath  EXAM: PORTABLE CHEST - 1 VIEW  COMPARISON:  01/05/2013  FINDINGS: Cardiomediastinal silhouette is stable. Again noted chronic interstitial prominence and scarring bilateral lower lobe. There is slight worsening in aeration with streaky airspace disease bilateral basilar suspicious for superimposed infiltrates. Follow-up to resolution is recommended after treatment.  IMPRESSION: Again noted chronic interstitial prominence and scarring bilateral lower lobe. There is slight worsening in aeration with streaky airspace disease bilateral basilar suspicious for superimposed infiltrates. Follow-up to resolution is recommended after treatment.   Electronically Signed   By: Natasha Mead   On: 06/24/2013 16:43    Microbiology: Recent Results (from the past 240 hour(s))  MRSA PCR SCREENING     Status: None   Collection Time    06/24/13  6:56 PM      Result Value Range Status   MRSA by PCR NEGATIVE  NEGATIVE Final   Comment:            The GeneXpert MRSA Assay (FDA     approved for NASAL specimens     only), is one component of a     comprehensive MRSA colonization     surveillance program. It is not     intended to diagnose MRSA     infection nor to guide or     monitor treatment for     MRSA infections.  CULTURE, BLOOD (ROUTINE X 2)     Status: None   Collection Time    06/25/13 12:40 AM      Result Value Range Status   Specimen Description BLOOD LEFT ARM   Final   Special Requests BOTTLES DRAWN AEROBIC ONLY 10CC   Final   Culture  Setup Time     Final   Value: 06/25/2013 04:39     Performed at [facility]   Culture     Final   Value: NO GROWTH 5 DAYS     Performed at [facility]   Report Status 07/01/2013 FINAL   Final  CULTURE, BLOOD (ROUTINE X 2)     Status: None   Collection Time    06/25/13  1:12 AM      Result Value Range Status   Specimen Description Blood   Final    Special Requests NONE   Final   Culture  Setup Time     Final   Value: 06/25/2013 04:39     Performed at [facility]   Culture     Final   Value: NO GROWTH 5 DAYS     Performed at [facility]   Report Status 07/01/2013 FINAL   Final  URINE CULTURE     Status: None   Collection Time    06/25/13  11:19 AM      Result Value Range Status   Specimen Description URINE, RANDOM   Final   Special Requests NONE   Final   Culture  Setup Time     Final   Value: 06/25/2013 13:40     Performed at [facility]   Colony Count     Final   Value: NO GROWTH     Performed at [facility]   Culture     Final   Value: NO GROWTH     Performed at [facility]   Report Status 06/27/2013 FINAL   Final     Labs: Basic Metabolic Panel:  Recent Labs Lab 06/28/13 0545 06/30/13 1946 07/01/13 0603  NA 139 137  --   K 3.9 3.5  --   CL 90* 89*  --   CO2 32 34*  --   GLUCOSE 194* 183*  --   BUN 27* 30*  --   CREATININE 1.18* 1.21* 1.12*  CALCIUM 9.2 9.1  --    BNP: BNP (last 3 results)  Recent Labs  09/24/12 1559 01/05/13 0455 06/24/13 1617  PROBNP <5.0 15.0 61.2   CBG:  Recent Labs Lab 07/01/13 0731 07/01/13 1125 07/01/13 1627 07/01/13 2209 07/02/13 0755  GLUCAP 118* 148* 207* 144* 124*       Signed:  Cathi Roan, PA-S2, Physician Assistant Student  [facility] 07/02/2013, 10:15 AM

## 2013-07-04 ENCOUNTER — Ambulatory Visit (INDEPENDENT_AMBULATORY_CARE_PROVIDER_SITE_OTHER): Payer: PRIVATE HEALTH INSURANCE | Admitting: Internal Medicine

## 2013-07-04 ENCOUNTER — Other Ambulatory Visit (INDEPENDENT_AMBULATORY_CARE_PROVIDER_SITE_OTHER): Payer: PRIVATE HEALTH INSURANCE

## 2013-07-04 ENCOUNTER — Encounter: Payer: Self-pay | Admitting: Internal Medicine

## 2013-07-04 VITALS — BP 122/68 | HR 85 | Temp 98.0°F | Wt 286.4 lb

## 2013-07-04 DIAGNOSIS — I5033 Acute on chronic diastolic (congestive) heart failure: Secondary | ICD-10-CM

## 2013-07-04 DIAGNOSIS — E1165 Type 2 diabetes mellitus with hyperglycemia: Secondary | ICD-10-CM

## 2013-07-04 DIAGNOSIS — J9620 Acute and chronic respiratory failure, unspecified whether with hypoxia or hypercapnia: Secondary | ICD-10-CM

## 2013-07-04 LAB — BASIC METABOLIC PANEL
CO2: 33 mEq/L — ABNORMAL HIGH (ref 19–32)
Chloride: 85 mEq/L — ABNORMAL LOW (ref 96–112)
Creatinine, Ser: 1.3 mg/dL — ABNORMAL HIGH (ref 0.4–1.2)
Potassium: 3.1 mEq/L — ABNORMAL LOW (ref 3.5–5.1)
Sodium: 132 mEq/L — ABNORMAL LOW (ref 135–145)

## 2013-07-04 MED ORDER — POTASSIUM CHLORIDE CRYS ER 20 MEQ PO TBCR: 20.0000 meq | EXTENDED_RELEASE_TABLET | Freq: Every day | ORAL | Status: DC

## 2013-07-04 MED ORDER — SPIRONOLACTONE 25 MG PO TABS: 25.0000 mg | ORAL_TABLET | Freq: Two times a day (BID) | ORAL | Status: DC

## 2013-07-04 MED ORDER — OXYCODONE HCL 10 MG PO TABS: 10.0000 mg | ORAL_TABLET | Freq: Three times a day (TID) | ORAL | Status: DC | PRN

## 2013-07-04 NOTE — Assessment & Plan Note (Signed)
New dx, clarified 11/2011 - exacerbated by frequent pred use Intol of metformin due to severe diarrhea side effects  On Lantus pen qhs and Amaryl in AM - titrate as needed recheck a1c q 3-6 mo  Lab Results  Component Value Date   HGBA1C 6.2* 06/24/2013

## 2013-07-04 NOTE — Assessment & Plan Note (Signed)
freq exacerbations - acute exac with hosp 06/2013 for COPD flare Chronic high dose diuretic - 100mg  demadex bid Check Bmet now Continue monitor sodium intake and monitor weights at home - call if increase weight >3#/day Encouragement provided on continued weight loss efforts

## 2013-07-04 NOTE — Patient Instructions (Signed)
It was good to see you today. We have reviewed your hospital records including labs and tests today Medications reviewed and updated Test(s) ordered today. Your results will be released to MyChart (or called to you) after review, usually within 72hours after test completion. If any changes need to be made, you will be notified at that same time. Keep scheduled followup here in 3 months to review diabetes mellitus, depression, weight, fluid, breathing and other medical issues; call sooner if problems Keep followup with Dr. Shelle Iron and your other specialists as planned  Congratulations on being done smoking! Do not start smoking again!!!

## 2013-07-04 NOTE — Progress Notes (Signed)
Subjective:    [name], female    DOB: 05-05-1966, 47 y.o.   MRN: [id number]  HPI Here for hospital follow up -  Admit date: 06/24/2013  Discharge date: 07/02/2013  Time spent: 60 minutes  Recommendations for Outpatient Follow-up:  1. Pulmonology follow up in 1 week 2. Follow up CXR in 4-6 weeks to confirm resolution of pneumonia 3. Follow up with PCP in 3 days for repeat CBC, CMP Discharge Diagnoses:  Principal Problem:  Acute-on-chronic respiratory failure  Active Problems:  PULMONARY SARCOIDOSIS  Morbid obesity  HYPERTENSION  Chronic respiratory failure  GERD  OBSTRUCTIVE SLEEP APNEA  Obesity hypoventilation syndrome  COPD with acute exacerbation  Also reviewed chronic medical issues and interval medical events  Past Medical History  Diagnosis Date  . Sleep apnea     noncompliant w/ CPAP  . Hypertension   . Hyperlipidemia   . Chronic headache   . Fibromyalgia     daily narcotics  . Anxiety     hx chronic BZ use, stopped 07/2010  . Anemia   . Pulmonary sarcoidosis     unimpressive CT chest 2011  . Colonic polyp   . GERD (gastroesophageal reflux disease)   . ALLERGIC RHINITIS   . Asthma   . CHF (congestive heart failure)     Diastolic with fluid overload, May, 2012, LVEF 60%  . Morbid obesity   . Depression   . Panic attacks   . Diabetes mellitus   . COPD (chronic obstructive pulmonary disease)     on home O2, moderate airflow obstruction, suspect d/t emphysema  . Obesity   . Elevated LFTs 09/2011  . Ovarian cyst   . Chronic back pain     Past Surgical History  Procedure Laterality Date  . Polypectomy  2011  . Lumbar microdiscectomy  07/06/2011    R L4-5, stern  . Back surgery    . Carpal tunnel release    . Steroid spinal injections    . Hysteroscopy w/d&c N/A 03/25/2013    Procedure: DILATATION AND CURETTAGE /HYSTEROSCOPY;  Surgeon: Lavina Hamman, MD;  Location: [facility];  Service: Gynecology;  Laterality: N/A;  . Dilation and  curettage of uterus       Review of Systems  Constitutional: Positive for fatigue. Negative for fever and unexpected weight change.  Respiratory: Positive for shortness of breath (improved). Negative for cough, wheezing and stridor.   Cardiovascular: Negative for chest pain, palpitations and leg swelling.  Psychiatric/Behavioral: Negative for suicidal ideas, sleep disturbance, self-injury and dysphoric mood. The patient is not nervous/anxious.        Objective:   Physical Exam BP 122/68  Pulse 85  Temp(Src) 98 F (36.7 C) (Oral)  Wt 286 lb 6.4 oz (129.91 kg)  BMI 42.91 kg/m2  SpO2 98%  LMP 10/16/2012 Wt Readings from Last 3 Encounters:  07/04/13 286 lb 6.4 oz (129.91 kg)  06/27/13 290 lb 12.6 oz (131.9 kg)  06/06/13 284 lb (128.822 kg)   Gen: obese, NAD.  wearing nasal cannula oxygen today Lungs: CTA B, decreased air mvt at bases - no wheeze CV: distant but RRR, no BLE edema Abd: obese, soft, nontender, bowel sounds present Neuro: awake alert and oriented x4. No cranial nerve deficits. Moves all extremities well. Coordination, balance, gait are normal Psyc: normal mood/affect   Lab Results  Component Value Date   WBC 7.1 06/25/2013   HGB 13.6 06/25/2013   HCT 40.7 06/25/2013   PLT 208 06/25/2013  GLUCOSE 183* 06/30/2013   CHOL 255* 02/13/2012   TRIG 133.0 02/13/2012   HDL 54.30 02/13/2012   LDLDIRECT 186.4 02/13/2012   LDLCALC  Value: 92        Total Cholesterol/HDL:CHD Risk Coronary Heart Disease Risk Table                     Men   Women  1/2 Average Risk   3.4   3.3  Average Risk       5.0   4.4  2 X Average Risk   9.6   7.1  3 X Average Risk  23.4   11.0        Use the calculated Patient Ratio above and the CHD Risk Table to determine the patient's CHD Risk.        ATP III CLASSIFICATION (LDL):  <100     mg/dL   Optimal  161-096  mg/dL   Near or Above                    Optimal  130-159  mg/dL   Borderline  045-409  mg/dL   High  >811     mg/dL   Very High 06/08/7828   ALT  29 05/08/2013   AST 29 05/08/2013   NA 137 06/30/2013   K 3.5 06/30/2013   CL 89* 06/30/2013   CREATININE 1.12* 07/01/2013   BUN 30* 06/30/2013   CO2 34* 06/30/2013   TSH 0.701 04/22/2012   HGBA1C 6.2* 06/24/2013       Assessment & Plan:   see problem list. Medications and labs reviewed today.  Time spent with pt today 35 minutes, greater than 50% time spent counseling patient on COPD exacerbation, A/C Resp failure, A/C dCHF and medication review. Also review of hospital records

## 2013-07-04 NOTE — Addendum Note (Signed)
Addended by: Rene Paci A on: 07/04/2013 05:47 PM   Modules accepted: Orders

## 2013-07-04 NOTE — Assessment & Plan Note (Signed)
Acute exacerbation with COPD 06/2013 - hospitalization for same reviewed A/C dCHF - see below Chronic hypoxic resp failure with COPD - improving with pred taper continue bipap at home, now on nasal mask since 10/14 hosp - baseline hypercarbia reviewed ongoing chronic inhalers (spiriva and symbicort) with prn nebulizer continue home oxygen and followup with pulmonary specialist as planned Commended pt on cessation of smoking and encouraged never to smoke again!

## 2013-07-06 ENCOUNTER — Other Ambulatory Visit: Payer: Self-pay | Admitting: Internal Medicine

## 2013-07-07 ENCOUNTER — Telehealth: Payer: Self-pay | Admitting: *Deleted

## 2013-07-07 NOTE — Telephone Encounter (Signed)
Notified pt with md response.../lmb 

## 2013-07-07 NOTE — Telephone Encounter (Signed)
Okay for patient to use Humalog 3 times a day with meals as listed for sliding scale Continue Lantus 20 units at bedtime until morning CBG under 80

## 2013-07-07 NOTE — Telephone Encounter (Signed)
Called pt concerning labs results gave md recommendations. Pt wanted to let md know that her BS at night has been running in the 300's. Last night it was 350 so she took 20 units of the lantus. This morning she said it was @ 91. Called nurse last night and she told her she might have taking too much need to get me recommendations on sliding scale for novolog...lmb

## 2013-07-07 NOTE — Telephone Encounter (Signed)
[facility] Triage Call Report Triage Record Num: [id number] Operator: Judeen Hammans Patient Name: [name] Call Date & Time: 07/06/2013 10:54:44PM Patient Phone: [phone] PCP: [name] Patient Gender: Female PCP Fax : [phone] Patient DOB: Mar 28, 1966 [facility] Name[facility] Reason for Call: Caller: [name]/Patient; PCP: [name] (Adults only); CB#: [phone]; Call regarding Blood Sugar is high. Pt currently on Steroids after being discharged from hospital on 10/8 for respiratory failure/pneumonia. Recently started Novolog sliding scale, but does not understand how it works and does not currently have sliding scale sheet with her, CBG = 325 at 10:52pm. Pt gave herself Novolog 20 units to treat. No symptoms of ketoacidosis at this time - pt has no concerning symptoms other than high blood sugar. CBG at 11:08 pm is 225. All emergent sxs ruled out per Diabetes: Control Problems Guideline except for "New or increasing symptoms or glucose out of control as defined by provider and taking medications/following therapy as prescribed." Rather than report to ED, pt prefers to monitor at home and call office 10/13 in the a.m. for appt. Advised pt to recheck blood sugar at midnight and if above 250 despite extra insulin to report to ED, and if below 70mg /dl to eat a protein/carbohydrate snack; that if any other concerning symptoms occur to call back. Pt agrees. Protocol(s) Used: Diabetes: Control Problems Recommended Outcome per Protocol: See Provider within 4 hours Reason for Outcome: New or increasing symptoms or glucose out of control as defined by provider or action plan AND taking medications/following therapy as prescribed Care Advice: ~ 10/

## 2013-07-08 ENCOUNTER — Inpatient Hospital Stay: Payer: Self-pay | Admitting: Adult Health

## 2013-07-10 ENCOUNTER — Telehealth: Payer: Self-pay | Admitting: *Deleted

## 2013-07-10 ENCOUNTER — Encounter: Payer: Self-pay | Admitting: Internal Medicine

## 2013-07-10 MED ORDER — FLUCONAZOLE 150 MG PO TABS: 150.0000 mg | ORAL_TABLET | Freq: Once | ORAL | Status: DC

## 2013-07-10 NOTE — Telephone Encounter (Signed)
Pt sent email needing diflucan sent to her pharmacy...lmb

## 2013-07-11 ENCOUNTER — Other Ambulatory Visit: Payer: Self-pay | Admitting: Internal Medicine

## 2013-07-11 ENCOUNTER — Emergency Department (HOSPITAL_COMMUNITY): Payer: PRIVATE HEALTH INSURANCE

## 2013-07-11 ENCOUNTER — Emergency Department (HOSPITAL_COMMUNITY)
Admission: EM | Admit: 2013-07-11 | Discharge: 2013-07-12 | Disposition: A | Discharge status: Home / Self Care | Payer: PRIVATE HEALTH INSURANCE | Attending: Emergency Medicine | Admitting: Emergency Medicine

## 2013-07-11 ENCOUNTER — Encounter (HOSPITAL_COMMUNITY): Payer: Self-pay | Admitting: Emergency Medicine

## 2013-07-11 DIAGNOSIS — Z8619 Personal history of other infectious and parasitic diseases: Secondary | ICD-10-CM | POA: Insufficient documentation

## 2013-07-11 DIAGNOSIS — I509 Heart failure, unspecified: Secondary | ICD-10-CM | POA: Insufficient documentation

## 2013-07-11 DIAGNOSIS — Z8701 Personal history of pneumonia (recurrent): Secondary | ICD-10-CM | POA: Insufficient documentation

## 2013-07-11 DIAGNOSIS — E119 Type 2 diabetes mellitus without complications: Secondary | ICD-10-CM | POA: Insufficient documentation

## 2013-07-11 DIAGNOSIS — Z79899 Other long term (current) drug therapy: Secondary | ICD-10-CM | POA: Insufficient documentation

## 2013-07-11 DIAGNOSIS — R079 Chest pain, unspecified: Secondary | ICD-10-CM

## 2013-07-11 DIAGNOSIS — J449 Chronic obstructive pulmonary disease, unspecified: Secondary | ICD-10-CM

## 2013-07-11 DIAGNOSIS — IMO0002 Reserved for concepts with insufficient information to code with codable children: Secondary | ICD-10-CM | POA: Insufficient documentation

## 2013-07-11 DIAGNOSIS — Z860100 Personal history of colon polyps, unspecified: Secondary | ICD-10-CM | POA: Insufficient documentation

## 2013-07-11 DIAGNOSIS — Z86718 Personal history of other venous thrombosis and embolism: Secondary | ICD-10-CM | POA: Insufficient documentation

## 2013-07-11 DIAGNOSIS — K219 Gastro-esophageal reflux disease without esophagitis: Secondary | ICD-10-CM | POA: Insufficient documentation

## 2013-07-11 DIAGNOSIS — F3289 Other specified depressive episodes: Secondary | ICD-10-CM | POA: Insufficient documentation

## 2013-07-11 DIAGNOSIS — G8929 Other chronic pain: Secondary | ICD-10-CM | POA: Insufficient documentation

## 2013-07-11 DIAGNOSIS — Z91040 Latex allergy status: Secondary | ICD-10-CM | POA: Insufficient documentation

## 2013-07-11 DIAGNOSIS — E669 Obesity, unspecified: Secondary | ICD-10-CM | POA: Insufficient documentation

## 2013-07-11 DIAGNOSIS — J441 Chronic obstructive pulmonary disease with (acute) exacerbation: Secondary | ICD-10-CM | POA: Insufficient documentation

## 2013-07-11 DIAGNOSIS — F329 Major depressive disorder, single episode, unspecified: Secondary | ICD-10-CM | POA: Insufficient documentation

## 2013-07-11 DIAGNOSIS — Z794 Long term (current) use of insulin: Secondary | ICD-10-CM | POA: Insufficient documentation

## 2013-07-11 DIAGNOSIS — Z87891 Personal history of nicotine dependence: Secondary | ICD-10-CM | POA: Insufficient documentation

## 2013-07-11 DIAGNOSIS — Z8742 Personal history of other diseases of the female genital tract: Secondary | ICD-10-CM | POA: Insufficient documentation

## 2013-07-11 DIAGNOSIS — I10 Essential (primary) hypertension: Secondary | ICD-10-CM | POA: Insufficient documentation

## 2013-07-11 DIAGNOSIS — Z86711 Personal history of pulmonary embolism: Secondary | ICD-10-CM | POA: Insufficient documentation

## 2013-07-11 DIAGNOSIS — Z862 Personal history of diseases of the blood and blood-forming organs and certain disorders involving the immune mechanism: Secondary | ICD-10-CM | POA: Insufficient documentation

## 2013-07-11 DIAGNOSIS — Z7982 Long term (current) use of aspirin: Secondary | ICD-10-CM | POA: Insufficient documentation

## 2013-07-11 DIAGNOSIS — IMO0001 Reserved for inherently not codable concepts without codable children: Secondary | ICD-10-CM | POA: Insufficient documentation

## 2013-07-11 LAB — CBC WITH DIFFERENTIAL/PLATELET
Eosinophils Absolute: 0.1 10*3/uL (ref 0.0–0.7)
Eosinophils Relative: 1 % (ref 0–5)
HCT: 44.5 % (ref 36.0–46.0)
Hemoglobin: 15 g/dL (ref 12.0–15.0)
Lymphocytes Relative: 6 % — ABNORMAL LOW (ref 12–46)
Lymphs Abs: 0.7 10*3/uL (ref 0.7–4.0)
MCH: 26.3 pg (ref 26.0–34.0)
MCHC: 33.7 g/dL (ref 30.0–36.0)
MCV: 78.1 fL (ref 78.0–100.0)
Monocytes Absolute: 0.5 10*3/uL (ref 0.1–1.0)
Monocytes Relative: 4 % (ref 3–12)
Neutro Abs: 11 10*3/uL — ABNORMAL HIGH (ref 1.7–7.7)
Platelets: 154 10*3/uL (ref 150–400)
RBC: 5.7 MIL/uL — ABNORMAL HIGH (ref 3.87–5.11)
RDW: 17.3 % — ABNORMAL HIGH (ref 11.5–15.5)
WBC: 12.2 10*3/uL — ABNORMAL HIGH (ref 4.0–10.5)

## 2013-07-11 LAB — URINALYSIS, ROUTINE W REFLEX MICROSCOPIC
Bilirubin Urine: NEGATIVE
Ketones, ur: NEGATIVE mg/dL
Leukocytes, UA: NEGATIVE
Nitrite: NEGATIVE
Protein, ur: NEGATIVE mg/dL
Specific Gravity, Urine: 1.011 (ref 1.005–1.030)
Urobilinogen, UA: 0.2 mg/dL (ref 0.0–1.0)
pH: 7.5 (ref 5.0–8.0)

## 2013-07-11 LAB — COMPREHENSIVE METABOLIC PANEL
ALT: 191 U/L — ABNORMAL HIGH (ref 0–35)
BUN: 18 mg/dL (ref 6–23)
CO2: 32 mEq/L (ref 19–32)
Calcium: 9.3 mg/dL (ref 8.4–10.5)
Creatinine, Ser: 0.87 mg/dL (ref 0.50–1.10)
GFR calc Af Amer: >90 mL/min (ref >90)
GFR calc non Af Amer: 79 mL/min — ABNORMAL LOW (ref >90)
Glucose, Bld: 325 mg/dL — ABNORMAL HIGH (ref 70–99)
Potassium: 3.9 mEq/L (ref 3.5–5.1)
Sodium: 139 mEq/L (ref 135–145)
Total Bilirubin: 0.6 mg/dL (ref 0.3–1.2)
Total Protein: 7.6 g/dL (ref 6.0–8.3)

## 2013-07-11 LAB — GLUCOSE, CAPILLARY: Glucose-Capillary: 257 mg/dL — ABNORMAL HIGH (ref 70–99)

## 2013-07-11 MED ORDER — IPRATROPIUM BROMIDE 0.02 % IN SOLN
0.5000 mg | Freq: Once | RESPIRATORY_TRACT | Status: AC
  Administered 2013-07-12: 0.5 mg via RESPIRATORY_TRACT
  Filled 2013-07-11: qty 2.5

## 2013-07-11 MED ORDER — ALBUTEROL SULFATE (5 MG/ML) 0.5% IN NEBU
5.0000 mg | INHALATION_SOLUTION | Freq: Once | RESPIRATORY_TRACT | Status: AC
  Administered 2013-07-12: 5 mg via RESPIRATORY_TRACT
  Filled 2013-07-11: qty 1

## 2013-07-11 MED ORDER — KETOROLAC TROMETHAMINE 30 MG/ML IJ SOLN
30.0000 mg | Freq: Once | INTRAMUSCULAR | Status: AC
  Administered 2013-07-11: 30 mg via INTRAVENOUS
  Filled 2013-07-11: qty 1

## 2013-07-11 MED ORDER — ASPIRIN 325 MG PO TABS
325.0000 mg | ORAL_TABLET | Freq: Once | ORAL | Status: AC
  Administered 2013-07-11: 325 mg via ORAL
  Filled 2013-07-11: qty 1

## 2013-07-11 MED ORDER — MORPHINE SULFATE 4 MG/ML IJ SOLN
4.0000 mg | Freq: Once | INTRAMUSCULAR | Status: AC
  Administered 2013-07-11: 4 mg via INTRAVENOUS
  Filled 2013-07-11: qty 1

## 2013-07-11 NOTE — ED Notes (Signed)
Went to waiting room called for pts family twice no answer.

## 2013-07-11 NOTE — ED Notes (Signed)
Portable chest xray at bedside.

## 2013-07-11 NOTE — ED Notes (Signed)
Pt states that around 5 or 6 pm she developed chest tightness and felt as though she needed a breathing txt. She took 2 breathing txt but sts it did not help. The feeling went away and the patient reports that about 20 minutes ago she had come CP that only lasted a few seconds and Dr. Patria Mane was at bedside.

## 2013-07-11 NOTE — ED Notes (Signed)
PER EMS: pt from home, was discharged from here 9 days ago and diagnosed with bilateral pneumonia. Pt reports she has COPD and is normally SOB but today it has progressively worsened. Pt reports sick family members, including son with upper respiratory infection. Reports non-productive cough since pneumonia. Denies CP, N/V. Lung sounds clear, O2-92% RA and reports she is on 2L [place] at home. EMS put pt on 4L [place] and O2 increased to 98%. VS: BP-136/84, HR-88 NSR, CBG-405.

## 2013-07-11 NOTE — ED Notes (Signed)
Dr Patria Mane in room to see pt

## 2013-07-12 LAB — TROPONIN I: Troponin I: <0.3 ng/mL (ref <0.30)

## 2013-07-12 MED ORDER — IBUPROFEN 600 MG PO TABS: 600.0000 mg | ORAL_TABLET | Freq: Three times a day (TID) | ORAL | Status: DC | PRN

## 2013-07-12 MED ORDER — HYDROCODONE-ACETAMINOPHEN 5-325 MG PO TABS: 1.0000 | ORAL_TABLET | ORAL | Status: DC | PRN

## 2013-07-12 NOTE — ED Provider Notes (Signed)
CSN: [id number]     Arrival date & time 07/11/13  2130 History   First MD Initiated Contact with Patient 07/11/13 2302     Chief Complaint  Patient presents with  . Shortness of Breath   HPI Patient reports discomfort in her chest on the right side with some radiation towards the left which is been intermittent over the past several days.  She was recently hospitalized with pneumonia and COPD exacerbation.  She continues on 20 mg of prednisone at this time.  She reports her breathing seems to worsen.  She denies exertional chest pain.  She denies orthopnea.  History of coronary artery disease.  She does have a family history of early cardiac disease.  She reports no productive cough. She denies lower extremity swelling.  History of DVT or pulmonary embolism.    Past Medical History  Diagnosis Date  . Sleep apnea     noncompliant w/ CPAP  . Hypertension   . Hyperlipidemia   . Chronic headache   . Fibromyalgia     daily narcotics  . Anxiety     hx chronic BZ use, stopped 07/2010  . Anemia   . Pulmonary sarcoidosis     unimpressive CT chest 2011  . Colonic polyp   . GERD (gastroesophageal reflux disease)   . ALLERGIC RHINITIS   . Asthma   . CHF (congestive heart failure)     Diastolic with fluid overload, May, 2012, LVEF 60%  . Morbid obesity   . Depression   . Panic attacks   . Diabetes mellitus   . COPD (chronic obstructive pulmonary disease)     on home O2, moderate airflow obstruction, suspect d/t emphysema  . Obesity   . Elevated LFTs 09/2011  . Ovarian cyst   . Chronic back pain    Past Surgical History  Procedure Laterality Date  . Polypectomy  2011  . Lumbar microdiscectomy  07/06/2011    R L4-5, stern  . Back surgery    . Carpal tunnel release    . Steroid spinal injections    . Hysteroscopy w/d&c N/A 03/25/2013    Procedure: DILATATION AND CURETTAGE /HYSTEROSCOPY;  Surgeon: Lavina Hamman, MD;  Location: [facility];  Service: Gynecology;  Laterality: N/A;  .  Dilation and curettage of uterus     Family History  Problem Relation Age of Onset  . Hypertension Mother   . Emphysema Father   . Hypertension Father   . Stomach cancer Father   . Allergies Brother   . Hypertension Brother   . Stomach cancer Brother   . Heart disease Mother   . Heart disease Father   . Heart disease Brother     died age 26 sudden death/MI   History  Substance Use Topics  . Smoking status: Former Smoker -- 0.25 packs/day for 29 years    Types: Cigarettes    Quit date: 06/24/2013  . Smokeless tobacco: Never Used     Comment: Resumed smoking September 2013 --2-3 CIGS DAILY AND E-CIG  . Alcohol Use: Yes     Comment: occasionally   OB History   Grav Para Term Preterm Abortions TAB SAB Ect Mult Living                 Review of Systems  All other systems reviewed and are negative.    Allergies  Ciprofloxacin; Latex; Simvastatin; Sulfamethoxazole-trimethoprim; Azithromycin; Doxycycline; Metformin and related; and Metronidazole  Home Medications   Current Outpatient Rx  Name  Route  Sig  Dispense  Refill  . albuterol (PROVENTIL) (2.5 MG/3ML) 0.083% nebulizer solution      USE 1 VIAL VIA NEBULIZER EVERY 6 HOURS AS NEEDED FOR WHEEZE OR SHORTNESS OF BREATH   75 mL   5   . ALPRAZolam (XANAX) 1 MG tablet   Oral   Take 1 mg by mouth 2 (two) times daily as needed for sleep or anxiety.         Marland Kitchen aspirin EC 81 MG tablet   Oral   Take 1 tablet (81 mg total) by mouth daily.   150 tablet   2   . budesonide-formoterol (SYMBICORT) 160-4.5 MCG/ACT inhaler   Inhalation   Inhale 2 puffs into the lungs 2 (two) times daily.   1 Inhaler   2   . cetirizine (ZYRTEC) 10 MG tablet   Oral   Take 10 mg by mouth daily.         Marland Kitchen esomeprazole (NEXIUM) 40 MG capsule   Oral   Take 1 capsule (40 mg total) by mouth 2 (two) times daily.   60 capsule   5   . fluticasone (FLONASE) 50 MCG/ACT nasal spray   Nasal   Place 2 sprays into the nose daily as needed for  rhinitis.         Marland Kitchen glimepiride (AMARYL) 1 MG tablet   Oral   Take 1 mg by mouth daily before breakfast.         . insulin aspart (NOVOLOG) 100 UNIT/ML injection      Before each meal 3 times a day, 140-199 - 2 units, 200-250 - 4 units, 251-299 - 6 units,  300-349 - 8 units,  350 or above 10 units. Insulin PEN if approved, provide syringes and needles if needed.   1 vial   12   . Insulin Glargine (LANTUS SOLOSTAR) 100 UNIT/ML SOPN      INJECT 20 UNITS INTO THE SKIN AT BEDTIME         . nystatin (MYCOSTATIN) 100000 UNIT/ML suspension   Oral   Take 5 mLs by mouth 4 (four) times daily. For oral thrush         . Oxycodone HCl 10 MG TABS   Oral   Take 10 mg by mouth 3 (three) times daily as needed (pain).         . potassium chloride SA (K-DUR,KLOR-CON) 20 MEQ tablet   Oral   Take 1 tablet (20 mEq total) by mouth daily.   30 tablet   3   . predniSONE (DELTASONE) 5 MG tablet      Label  & dispense according to the schedule below. 10 Pills PO for 3 days then, 8 Pills PO for 3 days, 6 Pills PO for 3 days, 4 Pills PO for 3 days, 2 Pills PO for 3 days, 1 Pills PO for 3 days, 1/2 Pill  PO for 3 days then STOP.   100 tablet   0   . PROAIR HFA 108 (90 BASE) MCG/ACT inhaler      INHALE 2 PUFFS BY MOUTH EVERY 6 HOURS AS NEEDED   8.5 g   1   . sertraline (ZOLOFT) 50 MG tablet   Oral   Take 1 tablet (50 mg total) by mouth daily.   30 tablet   3   . SPIRIVA HANDIHALER 18 MCG inhalation capsule      INHALE CONTENTS OF 1 CAPSULE USING HANDIHALER ONCE DAILY   30 capsule   6   .  spironolactone (ALDACTONE) 25 MG tablet   Oral   Take 1 tablet (25 mg total) by mouth 2 (two) times daily.   60 tablet   5   . tiZANidine (ZANAFLEX) 4 MG tablet      TAKE 1 TABLET BY MOUTH THREE TIMES DAILY AS NEEDED FOR MUSCLE SPASMS   90 tablet   0   . torsemide (DEMADEX) 100 MG tablet   Oral   Take 100 mg by mouth 2 (two) times daily.         . fluconazole (DIFLUCAN) 150 MG  tablet   Oral   Take 1 tablet (150 mg total) by mouth once.   2 tablet   0   . gabapentin (NEURONTIN) 300 MG capsule   Oral   Take 300 mg by mouth daily as needed. Take 1-2 tablets by mouth prn if nerve pain is real bad she will take two tablets         . HYDROcodone-acetaminophen (NORCO/VICODIN) 5-325 MG per tablet   Oral   Take 1 tablet by mouth every 4 (four) hours as needed for pain.   15 tablet   0   . ibuprofen (ADVIL,MOTRIN) 600 MG tablet   Oral   Take 1 tablet (600 mg total) by mouth every 8 (eight) hours as needed for pain.   15 tablet   0    BP 125/78  Pulse 79  Temp(Src) 98.2 F (36.8 C) (Oral)  Resp 20  Ht 5\' 8"  (1.727 m)  Wt 280 lb (127.007 kg)  BMI 42.58 kg/m2  SpO2 95%  LMP 10/16/2012 Physical Exam  Nursing note and vitals reviewed. Constitutional: She is oriented to person, place, and time. She appears well-developed and well-nourished. No distress.  HENT:  Head: Normocephalic and atraumatic.  Eyes: EOM are normal.  Neck: Normal range of motion.  Cardiovascular: Normal rate, regular rhythm and normal heart sounds.   Pulmonary/Chest: Effort normal. She has wheezes.  Abdominal: Soft. She exhibits no distension. There is no tenderness. There is no rebound.  Musculoskeletal: Normal range of motion.  Neurological: She is alert and oriented to person, place, and time.  Skin: Skin is warm and dry.  Psychiatric: She has a normal mood and affect. Judgment normal.    ED Course  Procedures (including critical care time) Labs Review Labs Reviewed  CBC WITH DIFFERENTIAL - Abnormal; Notable for the following:    WBC 12.2 (*)    RBC 5.70 (*)    RDW 17.3 (*)    Neutrophils Relative % 90 (*)    Neutro Abs 11.0 (*)    Lymphocytes Relative 6 (*)    All other components within normal limits  COMPREHENSIVE METABOLIC PANEL - Abnormal; Notable for the following:    Chloride 94 (*)    Glucose, Bld 325 (*)    Albumin 3.4 (*)    AST 91 (*)    ALT 191 (*)     Alkaline Phosphatase 165 (*)    GFR calc non Af Amer 79 (*)    All other components within normal limits  URINALYSIS, ROUTINE W REFLEX MICROSCOPIC - Abnormal; Notable for the following:    Glucose, UA 500 (*)    All other components within normal limits  GLUCOSE, CAPILLARY - Abnormal; Notable for the following:    Glucose-Capillary 257 (*)    All other components within normal limits  TROPONIN I  POCT I-STAT TROPONIN I   Imaging Review Dg Chest Portable 1 View  07/11/2013  CLINICAL DATA:  Shortness of breath  EXAM: PORTABLE CHEST - 1 VIEW  COMPARISON:  07/01/2013  FINDINGS: No cardiomegaly. Chronic coarse interstitial opacities at the bases. No superimposed opacity. No effusion or pneumothorax. No acute osseous findings.  IMPRESSION: Chronic lung scarring. No evidence of acute superimposed disease.   Electronically Signed   By: Tiburcio Pea M.D.   On: 07/11/2013 23:01  I personally reviewed the imaging tests through PACS system I reviewed available ER/hospitalization records through the EMR  ECG interpretation 2138   Date: 07/12/2013  Rate: 85  Rhythm: Ectopic atrial rhythm with inverted p waves throughout  QRS Axis: normal  Intervals: normal  ST/T Wave abnormalities: Diffuse nonspecific ST changes which likely represent J-point elevation without reciprocal changes.  Conduction Disutrbances: none  Narrative Interpretation:   Old EKG Reviewed: Nonspecific ST changes as compared to prior    ECG interpretation 2154   Date: 07/12/2013  Rate: 87  Rhythm: Ectopic atrial rhythm with inverted p waves throughout  QRS Axis: normal  Intervals: normal  ST/T Wave abnormalities: Ongoing nonspecific ST changes throughout without reciprocal changes  Conduction Disutrbances: none  Narrative Interpretation:   Old EKG Reviewed: No significant changes noted        MDM   1. COPD (chronic obstructive pulmonary disease)   2. Chest pain    1:32 AM Patient feels much better at this  time.  Patient is PERC negative.  EKG x2 without ischemic changes.  There is diffuse ST changes with possible PR depression which could represent pericarditis.  No reciprocal changes.    Lyanne Co, MD 07/12/13 [phone]

## 2013-07-14 ENCOUNTER — Encounter: Payer: Self-pay | Admitting: Pulmonary Disease

## 2013-07-14 ENCOUNTER — Ambulatory Visit (INDEPENDENT_AMBULATORY_CARE_PROVIDER_SITE_OTHER): Payer: PRIVATE HEALTH INSURANCE | Admitting: Pulmonary Disease

## 2013-07-14 VITALS — BP 118/80 | HR 79 | Temp 98.0°F | Ht 68.5 in | Wt 296.6 lb

## 2013-07-14 DIAGNOSIS — J449 Chronic obstructive pulmonary disease, unspecified: Secondary | ICD-10-CM

## 2013-07-14 DIAGNOSIS — G4733 Obstructive sleep apnea (adult) (pediatric): Secondary | ICD-10-CM

## 2013-07-14 DIAGNOSIS — J4489 Other specified chronic obstructive pulmonary disease: Secondary | ICD-10-CM

## 2013-07-14 DIAGNOSIS — D869 Sarcoidosis, unspecified: Secondary | ICD-10-CM

## 2013-07-14 DIAGNOSIS — E662 Morbid (severe) obesity with alveolar hypoventilation: Secondary | ICD-10-CM

## 2013-07-14 DIAGNOSIS — J9610 Chronic respiratory failure, unspecified whether with hypoxia or hypercapnia: Secondary | ICD-10-CM

## 2013-07-14 NOTE — Patient Instructions (Signed)
Continue your usual breathing medications Finish up prednisone taper. Keep apptm with Dr. Felicity Coyer. Work on weight loss followup with me again in 3mos, but call if having worsening breathing issues.

## 2013-07-14 NOTE — Progress Notes (Signed)
  Subjective:    [name], female    DOB: [birth year], 47 y.o.   MRN: [id number]  HPI The patient comes in today for a post hospital visit.  She was recently in the hospital for a COPD exacerbation, and questionable pneumonia.  She was treated with antibiotics as well as prednisone, and improved enough to be discharged home.  She had recurrence of her shortness of breath the end of last week, and went to the emergency room where nothing was found acutely from a pulmonary standpoint.  She did have an abnormal EKG, but workup did not point to any significant ischemia.  She has not had a recent cardiac evaluation?  She states that she has been completely compliant with her medication, and has not smoked since the end of September.  She feels that she is back to her baseline after being started on a prednisone taper by the emergency room.  She is not producing purulent mucus.   Review of Systems  Constitutional: Positive for fatigue. Negative for fever and unexpected weight change.  HENT: Negative for congestion, dental problem, ear pain, nosebleeds, postnasal drip, rhinorrhea, sinus pressure, sneezing, sore throat and trouble swallowing.   Eyes: Negative for redness and itching.  Respiratory: Positive for cough and shortness of breath. Negative for chest tightness and wheezing.   Cardiovascular: Negative for palpitations and leg swelling.  Gastrointestinal: Negative for nausea and vomiting.  Genitourinary: Negative for dysuria.  Musculoskeletal: Negative for joint swelling.  Skin: Negative for rash.  Neurological: Negative for headaches.  Hematological: Does not bruise/bleed easily.  Psychiatric/Behavioral: Negative for dysphoric mood. The patient is not nervous/anxious.        Objective:   Physical Exam Moderately obese female in no acute distress Nose without purulence or discharge noted No skin breakdown or pressure necrosis from the CPAP mask Neck without  lymphadenopathy or thyromegaly Chest with mildly decreased breath sounds, no wheezes or crackles Heart exam with regular rate and rhythm Lower extremities with minimal edema, no cyanosis Alert and oriented, moves all 4 extremities.       Assessment & Plan:

## 2013-07-14 NOTE — Assessment & Plan Note (Signed)
The patient currently has no bronchospasm on exam and is moving air adequately.  She feels that her breathing is improving since being on the prednisone.  It is really unclear to me why she keeps having these episodes of increased shortness of breath.  She is on a good bronchodilator regimen, and denies medical noncompliance or smoking.  I think her morbid obesity and deconditioning are major factors contributing to her shortness of breath.  It is unclear whether she may also have a cardiac issue, and we'll leave that to her primary care physician to decide about possible further workup.

## 2013-07-14 NOTE — Assessment & Plan Note (Signed)
The patient has a history of sarcoidosis which has not been active in quite some time.  She is asking whether this could be an issue for her increased shortness of breath, however I think it is unlikely.  There is nothing on the chest x-ray to suggest an acute flare.  If she continues to have breathing issues of unknown origin, would do a CT chest to make sure there is nothing else going on.

## 2013-07-14 NOTE — Assessment & Plan Note (Signed)
The patient is wearing CPAP compliantly without issues.

## 2013-07-15 ENCOUNTER — Telehealth: Payer: Self-pay | Admitting: *Deleted

## 2013-07-15 NOTE — Telephone Encounter (Signed)
[facility] Triage Call Report Triage Record Num: [id number] Operator: Amy Maggart Patient Name: [name] Call Date & Time: 07/11/2013 8:29:20PM Patient Phone: [phone] PCP: Rene Paci Patient Gender: Female PCP Fax : [phone] Patient DOB: 16-Sep-1966 [facility] Reason for Call: Caller: [name]/Patient; PCP: Rene Paci (Adults only); CB#: [phone]; Call regarding Cough/Congestion; Onset 07/10/13. Per caller, she was discharged from hospital 07/02/13 and seen in office for f/u 07/04/13. Per caller, she is on a Prednisone dose pack that has tapered down to 30mg  qd; states she has has increased shortness of breath and fatigue since yesterday 10/16. Caller is very short of breath at this time. Per caller, her glucose was 438 at 1930 and pt took 10uints Novolog at that time. She is due to take 20units of Lantus at hs. Triaged per Diabetes: Respiratory Problems; 911 disposition for "severe breathing problems." Instructed caller to hang up and call 911, caller verbalized understanding and that she will comply. Protocol(s) Used: Diabetes: Respiratory Problems Recommended Outcome per Protocol: Activate EMS 911 Reason for Outcome: Severe breathing problems Care Advice: ~ IMMEDIATE ACTION 10/17/

## 2013-07-16 ENCOUNTER — Emergency Department (HOSPITAL_COMMUNITY): Payer: PRIVATE HEALTH INSURANCE

## 2013-07-16 ENCOUNTER — Emergency Department (HOSPITAL_COMMUNITY)
Admission: EM | Admit: 2013-07-16 | Discharge: 2013-07-17 | Disposition: A | Discharge status: Home / Self Care | Payer: PRIVATE HEALTH INSURANCE | Attending: Emergency Medicine | Admitting: Emergency Medicine

## 2013-07-16 ENCOUNTER — Encounter (HOSPITAL_COMMUNITY): Payer: Self-pay | Admitting: Emergency Medicine

## 2013-07-16 ENCOUNTER — Ambulatory Visit: Payer: Self-pay | Admitting: Internal Medicine

## 2013-07-16 DIAGNOSIS — Z860100 Personal history of colon polyps, unspecified: Secondary | ICD-10-CM | POA: Insufficient documentation

## 2013-07-16 DIAGNOSIS — Z3202 Encounter for pregnancy test, result negative: Secondary | ICD-10-CM | POA: Insufficient documentation

## 2013-07-16 DIAGNOSIS — R1013 Epigastric pain: Secondary | ICD-10-CM

## 2013-07-16 DIAGNOSIS — Z87891 Personal history of nicotine dependence: Secondary | ICD-10-CM | POA: Insufficient documentation

## 2013-07-16 DIAGNOSIS — IMO0001 Reserved for inherently not codable concepts without codable children: Secondary | ICD-10-CM | POA: Insufficient documentation

## 2013-07-16 DIAGNOSIS — K219 Gastro-esophageal reflux disease without esophagitis: Secondary | ICD-10-CM | POA: Insufficient documentation

## 2013-07-16 DIAGNOSIS — Z91040 Latex allergy status: Secondary | ICD-10-CM | POA: Insufficient documentation

## 2013-07-16 DIAGNOSIS — R1011 Right upper quadrant pain: Secondary | ICD-10-CM | POA: Insufficient documentation

## 2013-07-16 DIAGNOSIS — IMO0002 Reserved for concepts with insufficient information to code with codable children: Secondary | ICD-10-CM | POA: Insufficient documentation

## 2013-07-16 DIAGNOSIS — R0789 Other chest pain: Secondary | ICD-10-CM

## 2013-07-16 DIAGNOSIS — I10 Essential (primary) hypertension: Secondary | ICD-10-CM | POA: Insufficient documentation

## 2013-07-16 DIAGNOSIS — F410 Panic disorder [episodic paroxysmal anxiety] without agoraphobia: Secondary | ICD-10-CM | POA: Insufficient documentation

## 2013-07-16 DIAGNOSIS — F329 Major depressive disorder, single episode, unspecified: Secondary | ICD-10-CM | POA: Insufficient documentation

## 2013-07-16 DIAGNOSIS — G8929 Other chronic pain: Secondary | ICD-10-CM | POA: Insufficient documentation

## 2013-07-16 DIAGNOSIS — I509 Heart failure, unspecified: Secondary | ICD-10-CM | POA: Insufficient documentation

## 2013-07-16 DIAGNOSIS — Z8742 Personal history of other diseases of the female genital tract: Secondary | ICD-10-CM | POA: Insufficient documentation

## 2013-07-16 DIAGNOSIS — F3289 Other specified depressive episodes: Secondary | ICD-10-CM | POA: Insufficient documentation

## 2013-07-16 DIAGNOSIS — E119 Type 2 diabetes mellitus without complications: Secondary | ICD-10-CM | POA: Insufficient documentation

## 2013-07-16 DIAGNOSIS — Z862 Personal history of diseases of the blood and blood-forming organs and certain disorders involving the immune mechanism: Secondary | ICD-10-CM | POA: Insufficient documentation

## 2013-07-16 DIAGNOSIS — Z7982 Long term (current) use of aspirin: Secondary | ICD-10-CM | POA: Insufficient documentation

## 2013-07-16 DIAGNOSIS — J4489 Other specified chronic obstructive pulmonary disease: Secondary | ICD-10-CM | POA: Insufficient documentation

## 2013-07-16 DIAGNOSIS — J449 Chronic obstructive pulmonary disease, unspecified: Secondary | ICD-10-CM | POA: Insufficient documentation

## 2013-07-16 DIAGNOSIS — Z794 Long term (current) use of insulin: Secondary | ICD-10-CM | POA: Insufficient documentation

## 2013-07-16 DIAGNOSIS — Z79899 Other long term (current) drug therapy: Secondary | ICD-10-CM | POA: Insufficient documentation

## 2013-07-16 DIAGNOSIS — G4730 Sleep apnea, unspecified: Secondary | ICD-10-CM | POA: Insufficient documentation

## 2013-07-16 LAB — CBC
HCT: 47.9 % — ABNORMAL HIGH (ref 36.0–46.0)
Hemoglobin: 16 g/dL — ABNORMAL HIGH (ref 12.0–15.0)
MCH: 26.7 pg (ref 26.0–34.0)
MCV: 79.8 fL (ref 78.0–100.0)
RBC: 6 MIL/uL — ABNORMAL HIGH (ref 3.87–5.11)

## 2013-07-16 LAB — COMPREHENSIVE METABOLIC PANEL
ALT: 75 U/L — ABNORMAL HIGH (ref 0–35)
CO2: 25 mEq/L (ref 19–32)
Calcium: 10.4 mg/dL (ref 8.4–10.5)
Chloride: 93 mEq/L — ABNORMAL LOW (ref 96–112)
Creatinine, Ser: 0.99 mg/dL (ref 0.50–1.10)
GFR calc Af Amer: 78 mL/min — ABNORMAL LOW (ref >90)
GFR calc non Af Amer: 67 mL/min — ABNORMAL LOW (ref >90)
Glucose, Bld: 196 mg/dL — ABNORMAL HIGH (ref 70–99)
Sodium: 134 mEq/L — ABNORMAL LOW (ref 135–145)
Total Bilirubin: 0.8 mg/dL (ref 0.3–1.2)
Total Protein: 8.1 g/dL (ref 6.0–8.3)

## 2013-07-16 LAB — URINALYSIS, ROUTINE W REFLEX MICROSCOPIC
Bilirubin Urine: NEGATIVE
Hgb urine dipstick: NEGATIVE
Ketones, ur: NEGATIVE mg/dL
Nitrite: NEGATIVE
Protein, ur: NEGATIVE mg/dL
Urobilinogen, UA: 0.2 mg/dL (ref 0.0–1.0)

## 2013-07-16 LAB — TROPONIN I: Troponin I: <0.3 ng/mL (ref <0.30)

## 2013-07-16 LAB — POCT I-STAT TROPONIN I: Troponin i, poc: 0 ng/mL (ref 0.00–0.08)

## 2013-07-16 LAB — LIPASE, BLOOD: Lipase: 44 U/L (ref 11–59)

## 2013-07-16 MED ORDER — LORAZEPAM 2 MG/ML IJ SOLN
0.5000 mg | Freq: Once | INTRAMUSCULAR | Status: AC
  Administered 2013-07-16: 0.5 mg via INTRAVENOUS
  Filled 2013-07-16: qty 1

## 2013-07-16 MED ORDER — GI COCKTAIL ~~LOC~~
30.0000 mL | Freq: Once | ORAL | Status: AC
  Administered 2013-07-16: 30 mL via ORAL
  Filled 2013-07-16: qty 30

## 2013-07-16 NOTE — ED Notes (Signed)
Pt states when she work this am she did not feel well and chest pain suddenly hit; pt states chest pain is constant but she also get intermit upper abdomen pain/cramping at pain level of 10. Pt states hx COPD. No SOB noted at present Sats at 98% RA. Pt observed being guarded when abdomen pain hits.

## 2013-07-16 NOTE — ED Notes (Signed)
C/o pain under bilateral breast since last night, intermittent "spasms" to L chest, diarrhea, and toes cramping on L foot.  Pt denies nausea/vomiting or sob.

## 2013-07-16 NOTE — ED Notes (Signed)
Was unable to get labs on pt. Called lab

## 2013-07-16 NOTE — ED Provider Notes (Signed)
CSN: [id number]     Arrival date & time 07/16/13  1930 History   First MD Initiated Contact with Patient 07/16/13 2117     Chief Complaint  Patient presents with  . Chest Pain  . Abdominal Pain   (Consider location/radiation/quality/duration/timing/severity/associated sxs/prior Treatment) HPI Comments: 47 year old female who presents with spasms in her epigastric area. She notes that they're intermittent and she has had about 4 spasms today. She also notes some mild chest pressure which lasts a few minutes at a time and has been intermittent for the last 5 hours. She currently denies any abdominal pain or chest pain on exam. She rates the severity of her symptoms as mild to moderate. She denies any other associated symptoms including vomiting or fever. She has not had similar symptoms previously. Nothing has relieved her symptoms thus far.  Patient is a 47 y.o. female presenting with chest pain and abdominal pain.  Chest Pain Associated symptoms: abdominal pain   Associated symptoms: no back pain, no cough, no dizziness, no fatigue, no fever, no headache, no nausea, no shortness of breath and not vomiting   Abdominal Pain Associated symptoms: chest pain   Associated symptoms: no cough, no diarrhea, no dysuria, no fatigue, no fever, no hematuria, no nausea, no shortness of breath and no vomiting     Past Medical History  Diagnosis Date  . Sleep apnea     noncompliant w/ CPAP  . Hypertension   . Hyperlipidemia   . Chronic headache   . Fibromyalgia     daily narcotics  . Anxiety     hx chronic BZ use, stopped 07/2010  . Anemia   . Pulmonary sarcoidosis     unimpressive CT chest 2011  . Colonic polyp   . GERD (gastroesophageal reflux disease)   . ALLERGIC RHINITIS   . Asthma   . CHF (congestive heart failure)     Diastolic with fluid overload, May, 2012, LVEF 60%  . Morbid obesity   . Depression   . Panic attacks   . Diabetes mellitus   . COPD (chronic obstructive pulmonary  disease)     on home O2, moderate airflow obstruction, suspect d/t emphysema  . Obesity   . Elevated LFTs 09/2011  . Ovarian cyst   . Chronic back pain    Past Surgical History  Procedure Laterality Date  . Polypectomy  2011  . Lumbar microdiscectomy  07/06/2011    R L4-5, stern  . Back surgery    . Carpal tunnel release    . Steroid spinal injections    . Hysteroscopy w/d&c N/A 03/25/2013    Procedure: DILATATION AND CURETTAGE /HYSTEROSCOPY;  Surgeon: Lavina Hamman, MD;  Location: [facility];  Service: Gynecology;  Laterality: N/A;  . Dilation and curettage of uterus     Family History  Problem Relation Age of Onset  . Hypertension Mother   . Emphysema Father   . Hypertension Father   . Stomach cancer Father   . Allergies Brother   . Hypertension Brother   . Stomach cancer Brother   . Heart disease Mother   . Heart disease Father   . Heart disease Brother     died age 52 sudden death/MI   History  Substance Use Topics  . Smoking status: Former Smoker -- 0.25 packs/day for 29 years    Types: Cigarettes    Quit date: 06/24/2013  . Smokeless tobacco: Never Used     Comment: Resumed smoking September 2013 --2-3 CIGS DAILY AND  E-CIG  . Alcohol Use: Yes     Comment: occasionally   OB History   Grav Para Term Preterm Abortions TAB SAB Ect Mult Living                 Review of Systems  Constitutional: Negative for fever and fatigue.  HENT: Negative for congestion and drooling.   Eyes: Negative for pain.  Respiratory: Negative for cough and shortness of breath.   Cardiovascular: Positive for chest pain.  Gastrointestinal: Positive for abdominal pain. Negative for nausea, vomiting and diarrhea.  Genitourinary: Negative for dysuria and hematuria.  Musculoskeletal: Negative for back pain, gait problem and neck pain.  Skin: Negative for color change.  Neurological: Negative for dizziness and headaches.  Hematological: Negative for adenopathy.  Psychiatric/Behavioral:  Negative for behavioral problems.  All other systems reviewed and are negative.    Allergies  Ciprofloxacin; Latex; Simvastatin; Sulfamethoxazole-trimethoprim; Azithromycin; Doxycycline; Metformin and related; and Metronidazole  Home Medications   Current Outpatient Rx  Name  Route  Sig  Dispense  Refill  . albuterol (PROVENTIL HFA;VENTOLIN HFA) 108 (90 BASE) MCG/ACT inhaler   Inhalation   Inhale 2 puffs into the lungs every 6 (six) hours as needed for wheezing or shortness of breath.         Marland Kitchen albuterol (PROVENTIL) (2.5 MG/3ML) 0.083% nebulizer solution   Nebulization   Take 2.5 mg by nebulization every 6 (six) hours as needed for wheezing.         Marland Kitchen ALPRAZolam (XANAX) 1 MG tablet   Oral   Take 1 mg by mouth 2 (two) times daily as needed for sleep or anxiety.         Marland Kitchen aspirin EC 81 MG tablet   Oral   Take 1 tablet (81 mg total) by mouth daily.   150 tablet   2   . budesonide-formoterol (SYMBICORT) 160-4.5 MCG/ACT inhaler   Inhalation   Inhale 2 puffs into the lungs 2 (two) times daily.   1 Inhaler   2   . cetirizine (ZYRTEC) 10 MG tablet   Oral   Take 10 mg by mouth daily.         Marland Kitchen esomeprazole (NEXIUM) 40 MG capsule   Oral   Take 1 capsule (40 mg total) by mouth 2 (two) times daily.   60 capsule   5   . fluticasone (FLONASE) 50 MCG/ACT nasal spray   Nasal   Place 2 sprays into the nose daily as needed for rhinitis.         Marland Kitchen gabapentin (NEURONTIN) 300 MG capsule   Oral   Take 300 mg by mouth daily as needed. Take 1-2 tablets by mouth prn if nerve pain is real bad she will take two tablets         . glimepiride (AMARYL) 1 MG tablet   Oral   Take 1 mg by mouth daily before breakfast.         . ibuprofen (ADVIL,MOTRIN) 600 MG tablet   Oral   Take 1 tablet (600 mg total) by mouth every 8 (eight) hours as needed for pain.   15 tablet   0   . insulin aspart (NOVOLOG) 100 UNIT/ML injection      Before each meal 3 times a day, 140-199 - 2  units, 200-250 - 4 units, 251-299 - 6 units,  300-349 - 8 units,  350 or above 10 units. Insulin PEN if approved, provide syringes and needles if needed.   1  vial   12   . Insulin Glargine (LANTUS SOLOSTAR [place])   Subcutaneous   Inject 20 Units into the skin at bedtime and may repeat dose one time if needed.         . nystatin (MYCOSTATIN) 100000 UNIT/ML suspension   Oral   Take 5 mLs by mouth 4 (four) times daily. For oral thrush         . Oxycodone HCl 10 MG TABS   Oral   Take 10 mg by mouth 3 (three) times daily as needed (pain).         . potassium chloride SA (K-DUR,KLOR-CON) 20 MEQ tablet   Oral   Take 1 tablet (20 mEq total) by mouth daily.   30 tablet   3   . predniSONE (DELTASONE) 5 MG tablet      Label  & dispense according to the schedule below. 10 Pills PO for 3 days then, 8 Pills PO for 3 days, 6 Pills PO for 3 days, 4 Pills PO for 3 days, 2 Pills PO for 3 days, 1 Pills PO for 3 days, 1/2 Pill  PO for 3 days then STOP.   100 tablet   0   . sertraline (ZOLOFT) 50 MG tablet   Oral   Take 1 tablet (50 mg total) by mouth daily.   30 tablet   3   . spironolactone (ALDACTONE) 25 MG tablet   Oral   Take 1 tablet (25 mg total) by mouth 2 (two) times daily.   60 tablet   5   . tiotropium (SPIRIVA) 18 MCG inhalation capsule   Inhalation   Place 18 mcg into inhaler and inhale daily.         Marland Kitchen tiZANidine (ZANAFLEX) 4 MG tablet   Oral   Take 4 mg by mouth 3 (three) times daily as needed (muscle spasms).         . torsemide (DEMADEX) 100 MG tablet   Oral   Take 100 mg by mouth 2 (two) times daily.          BP 132/70  Pulse 76  Temp(Src) 98 F (36.7 C) (Oral)  Resp 19  Ht 5\' 8"  (1.727 m)  Wt 280 lb (127.007 kg)  BMI 42.58 kg/m2  SpO2 89%  LMP 10/16/2012 Physical Exam  Nursing note and vitals reviewed. Constitutional: She is oriented to person, place, and time. She appears well-developed and well-nourished.  HENT:  Head: Normocephalic.   Mouth/Throat: Oropharynx is clear and moist. No oropharyngeal exudate.  Eyes: Conjunctivae and EOM are normal. Pupils are equal, round, and reactive to light.  Neck: Normal range of motion. Neck supple.  Cardiovascular: Normal rate, regular rhythm, normal heart sounds and intact distal pulses.  Exam reveals no gallop and no friction rub.   No murmur heard. Pulmonary/Chest: Effort normal and breath sounds normal. No respiratory distress. She has no wheezes.  Abdominal: Soft. Bowel sounds are normal. There is tenderness (mild epig and RUQ pain). There is no rebound and no guarding.  Musculoskeletal: Normal range of motion. She exhibits no edema and no tenderness.  Neurological: She is alert and oriented to person, place, and time.  Skin: Skin is warm and dry.  Psychiatric: She has a normal mood and affect. Her behavior is normal.    ED Course  Procedures (including critical care time) Labs Review Labs Reviewed  CBC - Abnormal; Notable for the following:    WBC 13.4 (*)    RBC 6.00 (*)  Hemoglobin 16.0 (*)    HCT 47.9 (*)    RDW 18.1 (*)    Platelets 148 (*)    All other components within normal limits  URINALYSIS, ROUTINE W REFLEX MICROSCOPIC  COMPREHENSIVE METABOLIC PANEL  LIPASE, BLOOD  TROPONIN I  POCT PREGNANCY, URINE  POCT I-STAT TROPONIN I   Imaging Review Dg Chest 2 View  07/16/2013   CLINICAL DATA:  Chest pain, abdominal pain. History of sarcoidosis.  EXAM: CHEST  2 VIEW  COMPARISON:  For 07/11/2013  FINDINGS: Chronic areas of scarring in the lung bases bilaterally. Chronic peribronchial thickening. Heart is borderline in size. No effusions or edema. No acute bony abnormality.  IMPRESSION: Chronic bronchitic changes and scarring. No acute findings.   Electronically Signed   By: Charlett Nose M.D.   On: 07/16/2013 20:31    EKG Interpretation     Ventricular Rate:  84 PR Interval:  148 QRS Duration: 72 QT Interval:  358 QTC Calculation: 423 R Axis:   6 Text  Interpretation:  Normal sinus rhythm Normal ECG No significant change since last tracing           Repeat ecg   Date: 07/17/2013  Rate: 83  Rhythm: normal sinus rhythm  QRS Axis: normal  Intervals: normal  ST/T Wave abnormalities: nonspecific ST changes  Conduction Disutrbances:none  Narrative Interpretation: Non-specific ST changes likely early repolarization  Old EKG Reviewed: unchanged    MDM   1. Epigastric pain   2. Atypical chest pain    10:06 PM 47 y.o. female here with chest pain and epigastric pain. She is afebrile and vital signs are unremarkable here. She appears well and exam and is currently asymptomatic. Atypical for cardiac pain. PERC neg. Will get screening labs and [place] as pt had RUQ pain on exam.   12:17 AM: I interpreted/reviewed the labs and/or imaging which were non-contributory.  Low risk for MACE per HEART score.  I have discussed the diagnosis/risks/treatment options with the patient and believe the pt to be eligible for discharge home to follow-up with pcp as scheduled next monday. We also discussed returning to the ED immediately if new or worsening sx occur. We discussed the sx which are most concerning (e.g., return of pain, sob, fever) that necessitate immediate return. Any new prescriptions provided to the patient are listed below.   Junius Argyle, MD 07/17/13 [phone]

## 2013-07-17 ENCOUNTER — Inpatient Hospital Stay: Payer: Self-pay | Admitting: Adult Health

## 2013-07-20 ENCOUNTER — Other Ambulatory Visit: Payer: Self-pay | Admitting: Internal Medicine

## 2013-07-21 ENCOUNTER — Encounter: Payer: Self-pay | Admitting: Internal Medicine

## 2013-07-21 ENCOUNTER — Ambulatory Visit (INDEPENDENT_AMBULATORY_CARE_PROVIDER_SITE_OTHER): Payer: PRIVATE HEALTH INSURANCE | Admitting: Internal Medicine

## 2013-07-21 VITALS — BP 120/72 | HR 89 | Temp 98.1°F | Wt 297.0 lb

## 2013-07-21 DIAGNOSIS — R55 Syncope and collapse: Secondary | ICD-10-CM

## 2013-07-21 DIAGNOSIS — E1165 Type 2 diabetes mellitus with hyperglycemia: Secondary | ICD-10-CM

## 2013-07-21 MED ORDER — INSULIN GLARGINE 100 UNIT/ML ~~LOC~~ SOLN: 20.0000 [IU] | Freq: Two times a day (BID) | SUBCUTANEOUS | Status: DC
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
## 2013-07-21 NOTE — Patient Instructions (Signed)
It was good to see you today.  We have reviewed your prior records including labs and tests today  Increase Lantus to 20 Units 2x/day until Sugars are under 200 throughout the day -then resume prior dosing. Continue same sliding scale and Amaryl as ongoing  Continue hydration. I suspect the episodes of blacking out are related to dehydration and elevated sugars. If persisting problems, please call us for referral to cardiology as needed

## 2013-07-22 NOTE — Assessment & Plan Note (Signed)
New dx, clarified 11/2011 - exacerbated by frequent pred use Intol of metformin due to severe diarrhea side effects  On Lantus pen qhs and Amaryl in AM - titrate as needed Increase Lantus to twice a day until fasting CBG is less than 200 recheck a1c q 3-6 mo  Lab Results  Component Value Date   HGBA1C 6.2* 06/24/2013

## 2013-07-28 ENCOUNTER — Other Ambulatory Visit: Payer: Self-pay | Admitting: Internal Medicine

## 2013-07-28 NOTE — Telephone Encounter (Signed)
Faxed script back to [facility].../lmb 

## 2013-07-28 NOTE — Telephone Encounter (Signed)
ok 

## 2013-07-29 ENCOUNTER — Encounter: Payer: Self-pay | Admitting: Internal Medicine

## 2013-07-29 DIAGNOSIS — M5416 Radiculopathy, lumbar region: Secondary | ICD-10-CM

## 2013-08-01 ENCOUNTER — Telehealth: Payer: Self-pay

## 2013-08-01 ENCOUNTER — Encounter: Payer: Self-pay | Admitting: Internal Medicine

## 2013-08-01 ENCOUNTER — Other Ambulatory Visit: Payer: Self-pay | Admitting: *Deleted

## 2013-08-01 MED ORDER — FLUTICASONE PROPIONATE 50 MCG/ACT NA SUSP: 2.0000 | Freq: Every day | NASAL | Status: DC | PRN

## 2013-08-01 NOTE — Telephone Encounter (Signed)
Sent email wanting refill on her flonase...lmb

## 2013-08-01 NOTE — Addendum Note (Signed)
Addended by: Rene Paci A on: 08/01/2013 05:06 PM   Modules accepted: Orders

## 2013-08-01 NOTE — Telephone Encounter (Signed)
Patient called requesting that oxycodone be refilled. Please advise,  Thanks!

## 2013-08-03 ENCOUNTER — Encounter (HOSPITAL_COMMUNITY): Payer: Self-pay | Admitting: Emergency Medicine

## 2013-08-03 ENCOUNTER — Emergency Department (HOSPITAL_COMMUNITY): Payer: PRIVATE HEALTH INSURANCE

## 2013-08-03 ENCOUNTER — Encounter: Payer: Self-pay | Admitting: Pulmonary Disease

## 2013-08-03 ENCOUNTER — Inpatient Hospital Stay (HOSPITAL_COMMUNITY)
Admission: EM | Admit: 2013-08-03 | Discharge: 2013-08-07 | DRG: 190 | Disposition: A | Discharge status: Home / Self Care | Payer: PRIVATE HEALTH INSURANCE | Attending: Family Medicine | Admitting: Family Medicine

## 2013-08-03 DIAGNOSIS — Z9981 Dependence on supplemental oxygen: Secondary | ICD-10-CM

## 2013-08-03 DIAGNOSIS — E1165 Type 2 diabetes mellitus with hyperglycemia: Secondary | ICD-10-CM | POA: Diagnosis present

## 2013-08-03 DIAGNOSIS — IMO0001 Reserved for inherently not codable concepts without codable children: Secondary | ICD-10-CM

## 2013-08-03 DIAGNOSIS — N939 Abnormal uterine and vaginal bleeding, unspecified: Secondary | ICD-10-CM

## 2013-08-03 DIAGNOSIS — D649 Anemia, unspecified: Secondary | ICD-10-CM

## 2013-08-03 DIAGNOSIS — M25561 Pain in right knee: Secondary | ICD-10-CM

## 2013-08-03 DIAGNOSIS — T380X5A Adverse effect of glucocorticoids and synthetic analogues, initial encounter: Secondary | ICD-10-CM | POA: Diagnosis not present

## 2013-08-03 DIAGNOSIS — R002 Palpitations: Secondary | ICD-10-CM

## 2013-08-03 DIAGNOSIS — J9620 Acute and chronic respiratory failure, unspecified whether with hypoxia or hypercapnia: Secondary | ICD-10-CM

## 2013-08-03 DIAGNOSIS — J441 Chronic obstructive pulmonary disease with (acute) exacerbation: Principal | ICD-10-CM

## 2013-08-03 DIAGNOSIS — F411 Generalized anxiety disorder: Secondary | ICD-10-CM

## 2013-08-03 DIAGNOSIS — M545 Low back pain, unspecified: Secondary | ICD-10-CM

## 2013-08-03 DIAGNOSIS — R739 Hyperglycemia, unspecified: Secondary | ICD-10-CM

## 2013-08-03 DIAGNOSIS — K219 Gastro-esophageal reflux disease without esophagitis: Secondary | ICD-10-CM

## 2013-08-03 DIAGNOSIS — Z7982 Long term (current) use of aspirin: Secondary | ICD-10-CM

## 2013-08-03 DIAGNOSIS — E662 Morbid (severe) obesity with alveolar hypoventilation: Secondary | ICD-10-CM

## 2013-08-03 DIAGNOSIS — J9600 Acute respiratory failure, unspecified whether with hypoxia or hypercapnia: Secondary | ICD-10-CM

## 2013-08-03 DIAGNOSIS — Z91199 Patient's noncompliance with other medical treatment and regimen due to unspecified reason: Secondary | ICD-10-CM

## 2013-08-03 DIAGNOSIS — R079 Chest pain, unspecified: Secondary | ICD-10-CM

## 2013-08-03 DIAGNOSIS — F3289 Other specified depressive episodes: Secondary | ICD-10-CM | POA: Diagnosis present

## 2013-08-03 DIAGNOSIS — N912 Amenorrhea, unspecified: Secondary | ICD-10-CM

## 2013-08-03 DIAGNOSIS — R1013 Epigastric pain: Secondary | ICD-10-CM

## 2013-08-03 DIAGNOSIS — F329 Major depressive disorder, single episode, unspecified: Secondary | ICD-10-CM

## 2013-08-03 DIAGNOSIS — I10 Essential (primary) hypertension: Secondary | ICD-10-CM

## 2013-08-03 DIAGNOSIS — J309 Allergic rhinitis, unspecified: Secondary | ICD-10-CM

## 2013-08-03 DIAGNOSIS — J9610 Chronic respiratory failure, unspecified whether with hypoxia or hypercapnia: Secondary | ICD-10-CM

## 2013-08-03 DIAGNOSIS — J811 Chronic pulmonary edema: Secondary | ICD-10-CM

## 2013-08-03 DIAGNOSIS — E669 Obesity, unspecified: Secondary | ICD-10-CM

## 2013-08-03 DIAGNOSIS — D869 Sarcoidosis, unspecified: Secondary | ICD-10-CM

## 2013-08-03 DIAGNOSIS — Z794 Long term (current) use of insulin: Secondary | ICD-10-CM

## 2013-08-03 DIAGNOSIS — J449 Chronic obstructive pulmonary disease, unspecified: Secondary | ICD-10-CM

## 2013-08-03 DIAGNOSIS — I2789 Other specified pulmonary heart diseases: Secondary | ICD-10-CM | POA: Diagnosis present

## 2013-08-03 DIAGNOSIS — I5033 Acute on chronic diastolic (congestive) heart failure: Secondary | ICD-10-CM

## 2013-08-03 DIAGNOSIS — R0789 Other chest pain: Secondary | ICD-10-CM

## 2013-08-03 DIAGNOSIS — Z720 Tobacco use: Secondary | ICD-10-CM

## 2013-08-03 DIAGNOSIS — J4489 Other specified chronic obstructive pulmonary disease: Secondary | ICD-10-CM

## 2013-08-03 DIAGNOSIS — Z87891 Personal history of nicotine dependence: Secondary | ICD-10-CM

## 2013-08-03 DIAGNOSIS — R51 Headache: Secondary | ICD-10-CM

## 2013-08-03 DIAGNOSIS — I369 Nonrheumatic tricuspid valve disorder, unspecified: Secondary | ICD-10-CM

## 2013-08-03 DIAGNOSIS — IMO0002 Reserved for concepts with insufficient information to code with codable children: Secondary | ICD-10-CM

## 2013-08-03 DIAGNOSIS — F32A Depression, unspecified: Secondary | ICD-10-CM

## 2013-08-03 DIAGNOSIS — Z9119 Patient's noncompliance with other medical treatment and regimen: Secondary | ICD-10-CM

## 2013-08-03 DIAGNOSIS — G4733 Obstructive sleep apnea (adult) (pediatric): Secondary | ICD-10-CM

## 2013-08-03 DIAGNOSIS — Z8249 Family history of ischemic heart disease and other diseases of the circulatory system: Secondary | ICD-10-CM

## 2013-08-03 DIAGNOSIS — I509 Heart failure, unspecified: Secondary | ICD-10-CM | POA: Diagnosis present

## 2013-08-03 DIAGNOSIS — E785 Hyperlipidemia, unspecified: Secondary | ICD-10-CM

## 2013-08-03 DIAGNOSIS — Z6841 Body Mass Index (BMI) 40.0 and over, adult: Secondary | ICD-10-CM

## 2013-08-03 DIAGNOSIS — M5431 Sciatica, right side: Secondary | ICD-10-CM

## 2013-08-03 DIAGNOSIS — D860 Sarcoidosis of lung: Secondary | ICD-10-CM

## 2013-08-03 DIAGNOSIS — R9430 Abnormal result of cardiovascular function study, unspecified: Secondary | ICD-10-CM

## 2013-08-03 LAB — COMPREHENSIVE METABOLIC PANEL
ALT: 54 U/L — ABNORMAL HIGH (ref 0–35)
AST: 29 U/L (ref 0–37)
Albumin: 3.6 g/dL (ref 3.5–5.2)
Alkaline Phosphatase: 109 U/L (ref 39–117)
BUN: 18 mg/dL (ref 6–23)
Chloride: 89 mEq/L — ABNORMAL LOW (ref 96–112)
Glucose, Bld: 299 mg/dL — ABNORMAL HIGH (ref 70–99)
Potassium: 4 mEq/L (ref 3.5–5.1)
Sodium: 134 mEq/L — ABNORMAL LOW (ref 135–145)
Total Bilirubin: 0.9 mg/dL (ref 0.3–1.2)
Total Protein: 8 g/dL (ref 6.0–8.3)

## 2013-08-03 LAB — BASIC METABOLIC PANEL
CO2: 32 mEq/L (ref 19–32)
Creatinine, Ser: 1.2 mg/dL — ABNORMAL HIGH (ref 0.50–1.10)
GFR calc non Af Amer: 53 mL/min — ABNORMAL LOW (ref >90)
Glucose, Bld: 194 mg/dL — ABNORMAL HIGH (ref 70–99)
Potassium: 3.5 mEq/L (ref 3.5–5.1)
Sodium: 133 mEq/L — ABNORMAL LOW (ref 135–145)

## 2013-08-03 LAB — TROPONIN I
Troponin I: <0.3 ng/mL (ref <0.30)
Troponin I: <0.3 ng/mL (ref <0.30)

## 2013-08-03 LAB — GLUCOSE, CAPILLARY
Glucose-Capillary: 137 mg/dL — ABNORMAL HIGH (ref 70–99)
Glucose-Capillary: 285 mg/dL — ABNORMAL HIGH (ref 70–99)
Glucose-Capillary: 355 mg/dL — ABNORMAL HIGH (ref 70–99)
Glucose-Capillary: 365 mg/dL — ABNORMAL HIGH (ref 70–99)

## 2013-08-03 LAB — CBC
HCT: 41.2 % (ref 36.0–46.0)
Hemoglobin: 13.7 g/dL (ref 12.0–15.0)
MCH: 27.6 pg (ref 26.0–34.0)
MCH: 26.8 pg (ref 26.0–34.0)
MCHC: 33 g/dL (ref 30.0–36.0)
MCV: 81.1 fL (ref 78.0–100.0)
MCV: 81.3 fL (ref 78.0–100.0)
Platelets: 173 10*3/uL (ref 150–400)
Platelets: 165 10*3/uL (ref 150–400)
RBC: 4.97 MIL/uL (ref 3.87–5.11)
RDW: 17.9 % — ABNORMAL HIGH (ref 11.5–15.5)
WBC: 9.1 10*3/uL (ref 4.0–10.5)

## 2013-08-03 LAB — POCT I-STAT 3, VENOUS BLOOD GAS (G3P V)
Bicarbonate: 36.4 mEq/L — ABNORMAL HIGH (ref 20.0–24.0)
TCO2: 38 mmol/L (ref 0–100)
pCO2, Ven: 55.3 mmHg — ABNORMAL HIGH (ref 45.0–50.0)
pH, Ven: 7.425 — ABNORMAL HIGH (ref 7.250–7.300)
pO2, Ven: 42 mmHg (ref 30.0–45.0)

## 2013-08-03 LAB — POCT I-STAT TROPONIN I: Troponin i, poc: 0.01 ng/mL (ref 0.00–0.08)

## 2013-08-03 MED ORDER — HEPARIN SODIUM (PORCINE) 5000 UNIT/ML IJ SOLN
5000.0000 [IU] | Freq: Three times a day (TID) | INTRAMUSCULAR | Status: DC
  Administered 2013-08-03 – 2013-08-07 (×13): 5000 [IU] via SUBCUTANEOUS
  Filled 2013-08-03 (×16): qty 1

## 2013-08-03 MED ORDER — MORPHINE SULFATE 4 MG/ML IJ SOLN
4.0000 mg | Freq: Once | INTRAMUSCULAR | Status: AC
  Administered 2013-08-03: 4 mg via INTRAVENOUS
  Filled 2013-08-03: qty 1

## 2013-08-03 MED ORDER — OXYCODONE HCL 5 MG PO TABS
10.0000 mg | ORAL_TABLET | Freq: Three times a day (TID) | ORAL | Status: DC | PRN
  Administered 2013-08-03 (×3): 10 mg via ORAL
  Filled 2013-08-03 (×3): qty 2

## 2013-08-03 MED ORDER — FUROSEMIDE 10 MG/ML IJ SOLN
80.0000 mg | Freq: Once | INTRAMUSCULAR | Status: AC
  Administered 2013-08-03: 80 mg via INTRAVENOUS
  Filled 2013-08-03: qty 8

## 2013-08-03 MED ORDER — SERTRALINE HCL 50 MG PO TABS
50.0000 mg | ORAL_TABLET | Freq: Every day | ORAL | Status: DC
  Administered 2013-08-03 – 2013-08-07 (×5): 50 mg via ORAL
  Filled 2013-08-03 (×6): qty 1

## 2013-08-03 MED ORDER — ALBUTEROL SULFATE (5 MG/ML) 0.5% IN NEBU: 2.5000 mg | INHALATION_SOLUTION | RESPIRATORY_TRACT | Status: DC | PRN

## 2013-08-03 MED ORDER — POTASSIUM CHLORIDE CRYS ER 20 MEQ PO TBCR
20.0000 meq | EXTENDED_RELEASE_TABLET | Freq: Every day | ORAL | Status: DC
  Administered 2013-08-03 – 2013-08-07 (×5): 20 meq via ORAL
  Filled 2013-08-03 (×6): qty 1

## 2013-08-03 MED ORDER — ALPRAZOLAM 0.5 MG PO TABS
0.5000 mg | ORAL_TABLET | Freq: Two times a day (BID) | ORAL | Status: DC | PRN
  Administered 2013-08-03 (×2): 0.5 mg via ORAL
  Filled 2013-08-03 (×2): qty 1

## 2013-08-03 MED ORDER — ALBUTEROL SULFATE (5 MG/ML) 0.5% IN NEBU
2.5000 mg | INHALATION_SOLUTION | RESPIRATORY_TRACT | Status: DC
  Administered 2013-08-03 – 2013-08-04 (×4): 2.5 mg via RESPIRATORY_TRACT
  Filled 2013-08-03 (×4): qty 0.5

## 2013-08-03 MED ORDER — INSULIN ASPART 100 UNIT/ML ~~LOC~~ SOLN
0.0000 [IU] | Freq: Three times a day (TID) | SUBCUTANEOUS | Status: DC
  Administered 2013-08-03 (×2): 8 [IU] via SUBCUTANEOUS
  Administered 2013-08-03 – 2013-08-04 (×3): 15 [IU] via SUBCUTANEOUS

## 2013-08-03 MED ORDER — INSULIN ASPART 100 UNIT/ML ~~LOC~~ SOLN
0.0000 [IU] | Freq: Every day | SUBCUTANEOUS | Status: DC
  Administered 2013-08-03: 5 [IU] via SUBCUTANEOUS

## 2013-08-03 MED ORDER — BUDESONIDE-FORMOTEROL FUMARATE 160-4.5 MCG/ACT IN AERO
2.0000 | INHALATION_SPRAY | Freq: Two times a day (BID) | RESPIRATORY_TRACT | Status: DC
  Administered 2013-08-03 – 2013-08-07 (×9): 2 via RESPIRATORY_TRACT
  Filled 2013-08-03: qty 6

## 2013-08-03 MED ORDER — TIOTROPIUM BROMIDE MONOHYDRATE 18 MCG IN CAPS
18.0000 ug | ORAL_CAPSULE | Freq: Every day | RESPIRATORY_TRACT | Status: DC
  Administered 2013-08-04 – 2013-08-07 (×4): 18 ug via RESPIRATORY_TRACT
  Filled 2013-08-03 (×2): qty 5

## 2013-08-03 MED ORDER — TORSEMIDE 100 MG PO TABS
100.0000 mg | ORAL_TABLET | Freq: Two times a day (BID) | ORAL | Status: DC
  Administered 2013-08-03 – 2013-08-05 (×4): 100 mg via ORAL
  Filled 2013-08-03 (×6): qty 1

## 2013-08-03 MED ORDER — ALBUTEROL SULFATE (5 MG/ML) 0.5% IN NEBU
2.5000 mg | INHALATION_SOLUTION | RESPIRATORY_TRACT | Status: DC
  Administered 2013-08-03: 2.5 mg via RESPIRATORY_TRACT
  Filled 2013-08-03 (×2): qty 0.5

## 2013-08-03 MED ORDER — DEXTROSE 5 % IV SOLN
1.0000 g | INTRAVENOUS | Status: DC
  Administered 2013-08-03 – 2013-08-05 (×3): 1 g via INTRAVENOUS
  Filled 2013-08-03 (×3): qty 10

## 2013-08-03 MED ORDER — ALBUTEROL SULFATE HFA 108 (90 BASE) MCG/ACT IN AERS
2.0000 | INHALATION_SPRAY | Freq: Four times a day (QID) | RESPIRATORY_TRACT | Status: DC | PRN
  Filled 2013-08-03: qty 6.7

## 2013-08-03 MED ORDER — GABAPENTIN 300 MG PO CAPS
300.0000 mg | ORAL_CAPSULE | Freq: Every day | ORAL | Status: DC
  Filled 2013-08-03 (×5): qty 1

## 2013-08-03 MED ORDER — FLUTICASONE PROPIONATE 50 MCG/ACT NA SUSP
2.0000 | Freq: Every day | NASAL | Status: DC | PRN
  Filled 2013-08-03: qty 16

## 2013-08-03 MED ORDER — PANTOPRAZOLE SODIUM 40 MG PO TBEC
40.0000 mg | DELAYED_RELEASE_TABLET | Freq: Every day | ORAL | Status: DC
  Administered 2013-08-03 – 2013-08-07 (×5): 40 mg via ORAL
  Filled 2013-08-03 (×6): qty 1

## 2013-08-03 MED ORDER — INSULIN GLARGINE 100 UNIT/ML ~~LOC~~ SOLN
20.0000 [IU] | Freq: Two times a day (BID) | SUBCUTANEOUS | Status: DC
  Administered 2013-08-03 (×2): 20 [IU] via SUBCUTANEOUS
  Filled 2013-08-03 (×4): qty 0.2

## 2013-08-03 MED ORDER — IPRATROPIUM BROMIDE 0.02 % IN SOLN
0.5000 mg | RESPIRATORY_TRACT | Status: DC
  Administered 2013-08-03 – 2013-08-04 (×4): 0.5 mg via RESPIRATORY_TRACT
  Filled 2013-08-03 (×4): qty 2.5

## 2013-08-03 MED ORDER — METHYLPREDNISOLONE SODIUM SUCC 125 MG IJ SOLR
60.0000 mg | Freq: Two times a day (BID) | INTRAMUSCULAR | Status: DC
  Administered 2013-08-03 – 2013-08-04 (×3): 60 mg via INTRAVENOUS
  Filled 2013-08-03 (×5): qty 0.96

## 2013-08-03 MED ORDER — SODIUM CHLORIDE 0.9 % IJ SOLN
3.0000 mL | Freq: Two times a day (BID) | INTRAMUSCULAR | Status: DC
  Administered 2013-08-04 – 2013-08-06 (×4): 3 mL via INTRAVENOUS

## 2013-08-03 MED ORDER — IPRATROPIUM BROMIDE 0.02 % IN SOLN
0.5000 mg | RESPIRATORY_TRACT | Status: DC
  Administered 2013-08-03: 0.5 mg via RESPIRATORY_TRACT
  Filled 2013-08-03 (×2): qty 2.5

## 2013-08-03 MED ORDER — METHYLPREDNISOLONE SODIUM SUCC 125 MG IJ SOLR
125.0000 mg | Freq: Once | INTRAMUSCULAR | Status: AC
  Administered 2013-08-03: 125 mg via INTRAVENOUS
  Filled 2013-08-03: qty 2

## 2013-08-03 MED ORDER — DEXTROSE 5 % IV SOLN
500.0000 mg | INTRAVENOUS | Status: DC
  Administered 2013-08-03 – 2013-08-04 (×2): 500 mg via INTRAVENOUS
  Filled 2013-08-03 (×3): qty 500

## 2013-08-03 MED ORDER — SPIRONOLACTONE 25 MG PO TABS
25.0000 mg | ORAL_TABLET | Freq: Two times a day (BID) | ORAL | Status: DC
  Administered 2013-08-03 – 2013-08-05 (×5): 25 mg via ORAL
  Filled 2013-08-03 (×7): qty 1

## 2013-08-03 MED ORDER — GUAIFENESIN ER 600 MG PO TB12
600.0000 mg | ORAL_TABLET | Freq: Two times a day (BID) | ORAL | Status: DC
  Administered 2013-08-03 – 2013-08-07 (×8): 600 mg via ORAL
  Filled 2013-08-03 (×10): qty 1

## 2013-08-03 MED ORDER — ASPIRIN EC 81 MG PO TBEC
81.0000 mg | DELAYED_RELEASE_TABLET | Freq: Every day | ORAL | Status: DC
  Administered 2013-08-03 – 2013-08-07 (×5): 81 mg via ORAL
  Filled 2013-08-03 (×6): qty 1

## 2013-08-03 NOTE — ED Provider Notes (Addendum)
CSN: [id number]     Arrival date & time 08/03/13  0016 History   First MD Initiated Contact with Patient 08/03/13 0036     Chief Complaint  Patient presents with  . 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 she wears supplemental oxygen at home. She presents today with complaints of increased shortness of breath as well as a sense of diffuse chest tightness along fullness. She denies fever. She reports a minimal, nonproductive cough. She reports compliance with all medications.  Patient presents with acute respiratory distress and this limits my ability to obtain history from the patient.  The patient denies history of ETT and says she has been told that "this is a last resort". She says she had been treated with BiPAP several times in the ED.   Past Medical History  Diagnosis Date  . Sleep apnea     noncompliant w/ CPAP  . Hypertension   . Hyperlipidemia   . Chronic headache   . Fibromyalgia     daily narcotics  . Anxiety     hx chronic BZ use, stopped 07/2010  . Anemia   . Pulmonary sarcoidosis     unimpressive CT chest 2011  . Colonic polyp   . GERD (gastroesophageal reflux disease)   . ALLERGIC RHINITIS   . Asthma   . CHF (congestive heart failure)     Diastolic with fluid overload, May, 2012, LVEF 60%  . Morbid obesity   . Depression   . Panic attacks   . Diabetes mellitus   . COPD (chronic obstructive pulmonary disease)     on home O2, moderate airflow obstruction, suspect d/t emphysema  . Obesity   . Elevated LFTs 09/2011  . Ovarian cyst   . Chronic back pain    Past Surgical History  Procedure Laterality Date  . Polypectomy  2011  . Lumbar microdiscectomy  07/06/2011    R L4-5, stern  . Back surgery    . Carpal tunnel release     . Steroid spinal injections    . Hysteroscopy w/d&c N/A 03/25/2013    Procedure: DILATATION AND CURETTAGE /HYSTEROSCOPY;  Surgeon: Lavina Hamman, MD;  Location: [facility];  Service: Gynecology;  Laterality: N/A;  . Dilation and curettage of uterus     Family History  Problem Relation Age of Onset  . Hypertension Mother   . Emphysema Father   . Hypertension Father   . Stomach cancer Father   . Allergies Brother   . Hypertension Brother   . Stomach cancer Brother   . Heart disease Mother   . Heart disease Father   . Heart disease Brother     died age 20 sudden death/MI   History  Substance Use Topics  . Smoking status: Former Smoker -- 0.25 packs/day for 29 years    Types: Cigarettes    Quit date: 06/24/2013  . Smokeless tobacco: Never Used     Comment: Resumed smoking September 2013 --2-3 CIGS DAILY AND E-CIG  . Alcohol Use: Yes     Comment: occasionally   OB History   Grav Para Term Preterm Abortions TAB SAB Ect Mult Living                 Review of Systems Limited review of systems obtained from the patient because of respiratory distress. Please see history of present illness for pertinent  negatives and positives. The patient denies abdominal pain, vomiting and diarrhea. She denies chest pain  Allergies  Ciprofloxacin; Latex; Simvastatin; Sulfamethoxazole-trimethoprim; Azithromycin; Doxycycline; Metformin and related; and Metronidazole  Home Medications   Current Outpatient Rx  Name  Route  Sig  Dispense  Refill  . albuterol (PROVENTIL HFA;VENTOLIN HFA) 108 (90 BASE) MCG/ACT inhaler   Inhalation   Inhale 2 puffs into the lungs every 6 (six) hours as needed for wheezing or shortness of breath.         Marland Kitchen albuterol (PROVENTIL) (2.5 MG/3ML) 0.083% nebulizer solution   Nebulization   Take 2.5 mg by nebulization every 6 (six) hours as needed for wheezing.         Marland Kitchen ALPRAZolam (XANAX) 1 MG tablet      TAKE 1 TABLET BY MOUTH TWICE DAILY AS NEEDED   60 tablet   3    . aspirin EC 81 MG tablet   Oral   Take 1 tablet (81 mg total) by mouth daily.   150 tablet   2   . B-D ULTRAFINE III SHORT PEN 31G X 8 MM MISC      USE TO ADMINISTER INSULIN IN SOLOSTAR PEN   100 each   5   . budesonide-formoterol (SYMBICORT) 160-4.5 MCG/ACT inhaler   Inhalation   Inhale 2 puffs into the lungs 2 (two) times daily.   1 Inhaler   2   . cetirizine (ZYRTEC) 10 MG tablet   Oral   Take 10 mg by mouth daily.         Marland Kitchen esomeprazole (NEXIUM) 40 MG capsule   Oral   Take 1 capsule (40 mg total) by mouth 2 (two) times daily.   60 capsule   5   . fluticasone (FLONASE) 50 MCG/ACT nasal spray   Each Nare   Place 2 sprays into both nostrils daily as needed for rhinitis.   16 g   2   . gabapentin (NEURONTIN) 300 MG capsule   Oral   Take 300 mg by mouth daily as needed. Take 1-2 tablets by mouth prn if nerve pain is real bad she will take two tablets         . glimepiride (AMARYL) 1 MG tablet   Oral   Take 1 mg by mouth daily before breakfast.         . ibuprofen (ADVIL,MOTRIN) 600 MG tablet   Oral   Take 1 tablet (600 mg total) by mouth every 8 (eight) hours as needed for pain.   15 tablet   0   . insulin aspart (NOVOLOG) 100 UNIT/ML injection      Before each meal 3 times a day, 140-199 - 2 units, 200-250 - 4 units, 251-299 - 6 units,  300-349 - 8 units,  350 or above 10 units. Insulin PEN if approved, provide syringes and needles if needed.   1 vial   12   . insulin glargine (LANTUS) 100 UNIT/ML injection   Subcutaneous   Inject 0.2 mLs (20 Units total) into the skin 2 (two) times daily.   10 mL   12   . nystatin (MYCOSTATIN) 100000 UNIT/ML suspension   Oral   Take 5 mLs by mouth 4 (four) times daily. For oral thrush         . ONE TOUCH ULTRA TEST test strip      USE AS DIRECTED   100 each   5   . Oxycodone HCl 10 MG TABS   Oral  Take 10 mg by mouth 3 (three) times daily as needed (pain).         . potassium chloride SA  (K-DUR,KLOR-CON) 20 MEQ tablet   Oral   Take 1 tablet (20 mEq total) by mouth daily.   30 tablet   3   . sertraline (ZOLOFT) 50 MG tablet   Oral   Take 1 tablet (50 mg total) by mouth daily.   30 tablet   3   . spironolactone (ALDACTONE) 25 MG tablet   Oral   Take 1 tablet (25 mg total) by mouth 2 (two) times daily.   60 tablet   5   . tiotropium (SPIRIVA) 18 MCG inhalation capsule   Inhalation   Place 18 mcg into inhaler and inhale daily.         Marland Kitchen tiZANidine (ZANAFLEX) 4 MG tablet   Oral   Take 4 mg by mouth 3 (three) times daily as needed (muscle spasms).         . torsemide (DEMADEX) 100 MG tablet   Oral   Take 100 mg by mouth 2 (two) times daily.          BP 116/83  Pulse 110  Temp(Src) 97.9 F (36.6 C) (Oral)  Resp 22  Ht 5\' 8"  (1.727 m)  Wt 305 lb 11.2 oz (138.665 kg)  BMI 46.49 kg/m2  SpO2 81%  LMP 10/16/2012 Physical Exam Gen: Acutely and chronically ill-appearing, morbidly obese, patient appears somnolent but, arousable Head: NCAT Eyes: PERL, EOMI Nose: no epistaixis or rhinorrhea Mouth/throat: mucosa is moist and pink  Neck: supple, no stridor Lungs: Respiratory rate is 36-44 times per minute, shallow respirations noted, diminished breath sounds at both bases, accessory muscle use. CV: Rapid rate and regular rythm, no murmur good distal pulses, Abd:  Morbidly obese,  soft, notender, nondistended Back: no deformities appreciated Skin: warm and dry Ext: bilateral lower leg edema - symmetric, nontender Neuro: CN ii-xii grossly intact, no focal motor deficits, speech wnl, gait not assessed Psyche; flat affect, cooperative   ED Course  Procedures (including critical care time) Labs Review Results for orders placed during the hospital encounter of 08/03/13 (from the past 24 hour(s))  CBC     Status: Abnormal   Collection Time    08/03/13 12:46 AM      Result Value Range   WBC 9.1  4.0 - 10.5 K/uL   RBC 4.97  3.87 - 5.11 MIL/uL   Hemoglobin  13.7  12.0 - 15.0 g/dL   HCT 16.1  09.6 - 04.5 %   MCV 81.1  78.0 - 100.0 fL   MCH 27.6  26.0 - 34.0 pg   MCHC 34.0  30.0 - 36.0 g/dL   RDW 40.9 (*) 81.1 - 91.4 %   Platelets 173  150 - 400 K/uL  BASIC METABOLIC PANEL     Status: Abnormal   Collection Time    08/03/13 12:46 AM      Result Value Range   Sodium 133 (*) 135 - 145 mEq/L   Potassium 3.5  3.5 - 5.1 mEq/L   Chloride 88 (*) 96 - 112 mEq/L   CO2 32  19 - 32 mEq/L   Glucose, Bld 194 (*) 70 - 99 mg/dL   BUN 17  6 - 23 mg/dL   Creatinine, Ser 7.82 (*) 0.50 - 1.10 mg/dL   Calcium 9.3  8.4 - 95.6 mg/dL   GFR calc non Af Amer 53 (*) >90 mL/min   GFR  calc Af Amer 62 (*) >90 mL/min  PRO B NATRIURETIC PEPTIDE     Status: Abnormal   Collection Time    08/03/13 12:46 AM      Result Value Range   Pro B Natriuretic peptide (BNP) 2051.0 (*) 0 - 125 pg/mL  POCT I-STAT 3, BLOOD GAS (G3P V)     Status: Abnormal   Collection Time    08/03/13 12:50 AM      Result Value Range   pH, Ven 7.425 (*) 7.250 - 7.300   pCO2, Ven 55.3 (*) 45.0 - 50.0 mmHg   pO2, Ven 42.0  30.0 - 45.0 mmHg   Bicarbonate 36.4 (*) 20.0 - 24.0 mEq/L   TCO2 38  0 - 100 mmol/L   O2 Saturation 78.0     Acid-Base Excess 10.0 (*) 0.0 - 2.0 mmol/L   Patient temperature 97.9 F     Sample type VENOUS    POCT I-STAT TROPONIN I     Status: None   Collection Time    08/03/13 12:53 AM      Result Value Range   Troponin i, poc 0.01  0.00 - 0.08 ng/mL   Comment 3            Imaging Review DG Chest Port 1 View (Final result)  Result time: 08/03/13 01:21:30    Final result by Rad Results In Interface (08/03/13 01:21:30)    Narrative:   CLINICAL DATA: Shortness of breath  EXAM: PORTABLE CHEST - 1 VIEW  COMPARISON: 07/16/2013  FINDINGS: Normal heart size. Scarring is noted within both lung bases. There is mild diffuse edema identified. No airspace consolidation.  IMPRESSION: 1. Mild interstitial edema.  2. Bibasilar scarring.   Electronically Signed By:  Signa Kell M.D. On: 08/03/2013 01:21   EKG: sinus tach, no acute ischemic changes, normal intervals, normal QRS, norma. ST-T segments.    MDM  Patient with acute on chronic respiratory failure.  CXR consistent with pulmonary edema. The patient may be suffering from acute flare of pulmonary sarcoidosis. BNP is elevated. She is afebrile with normal WBC so, infectious process less likely. Good response to BIPAP. Tx with solumedrol initiated. We will tx with IV lasix as well and consult [facility] for admission to SDU.     Brandt Loosen, MD 08/03/13 0158  CRITICAL CARE Performed by: Brandt Loosen   Total critical care time: 46m  Critical care time was exclusive of separately billable procedures and treating other patients.  Critical care was necessary to treat or prevent imminent or life-threatening deterioration.  Critical care was time spent personally by me on the following activities: development of treatment plan with patient and/or surrogate as well as nursing, discussions with consultants, evaluation of patient's response to treatment, examination of patient, obtaining history from patient or surrogate, ordering and performing treatments and interventions, ordering and review of laboratory studies, ordering and review of radiographic studies, pulse oximetry and re-evaluation of patient's condition.   Brandt Loosen, MD 08/03/13 [phone]

## 2013-08-03 NOTE — Progress Notes (Signed)
[facility] PROGRESS NOTE  [name] ZOX:[id number] DOB: Jul 26, 1966 DOA: 08/03/2013 PCP: Yvette Paci, MD  Assessment/Plan: 1. Acute on chronic hypoxemic respiratory failure. I suspect secondary to chronic obstructive pulmonary disease exacerbation which could have been precipitated by underlying infectious process. Will continue empiric IV antibiotic therapy with Rocephin and azithromycin as well as IV steroids. It is possible that acute CHF may have contributed as well, she presents with a BNP of 2051. Continue Demadex. 2. Chronic obstructive pulmonary disease exacerbation. Continue duo nebs, IV steroids, IV antibiotics, supplemental oxygen and supportive care. 3. Chronic diastolic congestive heart failure. Patient and her last transthoracic echocardiogram on 09/03/2012 which showed an ejection fraction of 60-65%. Repeat transthoracic echocardiogram at this time.  4. Chest tightness. Patient over at reporting chest tightness, with initial troponin negative. EKG showed sinus rhythm, with no change from prior EKG. Could be related to her COPD exacerbation although has multiple cardiovascular risk factors. Will cycle troponin today, followup on transthoracic echocardiogram. 5. Type 2 diabetes mellitus. Blood sugars elevated, suspect related to IV steroids. Continue Accu-Cheks q. a.c. and each bedtime with sliding scale coverage. She is on Lantus 20 units subcutaneous twice a day. 6. DVT prophylaxis. Heparin 5000 units subcutaneous 3 times a day  Code Status: Full code Disposition Plan: Continue IV steroids, antibiotics, diuretic therapy. Followup on cardiac enzymes and transthoracic echocardiogram   Antibiotics:  Ceftriaxone  Azithromycin  HPI/Subjective: Patient is a pleasant 47 year old female with a past medical history of COPD, chronic hypoxemic respiratory failure who uses 2 L supplemental oxygen at home, presented overnight with complaints of increasing shortness of  breath. Chest x-ray did not show evidence of infiltrate. She was started on empiric antibiotic therapy, steroids, this morning having 5 L of supplemental oxygen requirement. She complains of ongoing shortness of breath particularly with physical exertion. Patient also complains of chest tightness overnight however is currently chest pain-free.  Objective: Filed Vitals:   08/03/13 0805  BP: 114/74  Pulse: 94  Temp:   Resp: 17    Intake/Output Summary (Last 24 hours) at 08/03/13 0931 Last data filed at 08/03/13 8119  Gross per 24 hour  Intake      0 ml  Output   1300 ml  Net  -1300 ml   Filed Weights   08/03/13 0022 08/03/13 0347  Weight: 138.665 kg (305 lb 11.2 oz) 136.6 kg (301 lb 2.4 oz)    Exam:   General:  Patient is in no acute distress, awake alert oriented  Cardiovascular: Regular rate rhythm normal S1-S2  Respiratory: She has severely diminished breath sounds bilaterally, positive expiratory wheezing, few scattered crackles  Abdomen: Obese soft nontender nontender positive bowel  Musculoskeletal: 1-2+ bilateral extremity pitting edema  Data Reviewed: Basic Metabolic Panel:  Recent Labs Lab 08/03/13 0046 08/03/13 0550  NA 133* 134*  K 3.5 4.0  CL 88* 89*  CO2 32 30  GLUCOSE 194* 299*  BUN 17 18  CREATININE 1.20* 1.02  CALCIUM 9.3 8.8   Liver Function Tests:  Recent Labs Lab 08/03/13 0550  AST 29  ALT 54*  ALKPHOS 109  BILITOT 0.9  PROT 8.0  ALBUMIN 3.6   No results found for this basename: LIPASE, AMYLASE,  in the last 168 hours No results found for this basename: AMMONIA,  in the last 168 hours CBC:  Recent Labs Lab 08/03/13 0046 08/03/13 0550  WBC 9.1 10.5  HGB 13.7 13.6  HCT 40.3 41.2  MCV 81.1 81.3  PLT 173 165  Cardiac Enzymes:  Recent Labs Lab 08/03/13 0550  TROPONINI <0.30   BNP (last 3 results)  Recent Labs  01/05/13 0455 06/24/13 1617 08/03/13 0046  PROBNP 15.0 61.2 2051.0*   CBG:  Recent Labs Lab  08/03/13 0221 08/03/13 0348  GLUCAP 137* 203*    No results found for this or any previous visit (from the past 240 hour(s)).   Studies: Dg Chest Port 1 View  08/03/2013   CLINICAL DATA:  Shortness of breath  EXAM: PORTABLE CHEST - 1 VIEW  COMPARISON:  07/16/2013  FINDINGS: Normal heart size. Scarring is noted within both lung bases. There is mild diffuse edema identified. No airspace consolidation.  IMPRESSION: 1. Mild interstitial edema.  2. Bibasilar scarring.   Electronically Signed   By: Signa Kell M.D.   On: 08/03/2013 01:21    Scheduled Meds: . albuterol  2.5 mg Nebulization Q4H  . aspirin EC  81 mg Oral Daily  . azithromycin  500 mg Intravenous Q24H  . budesonide-formoterol  2 puff Inhalation BID  . cefTRIAXone (ROCEPHIN)  IV  1 g Intravenous Q24H  . gabapentin  300 mg Oral QHS  . guaiFENesin  600 mg Oral BID  . heparin  5,000 Units Subcutaneous Q8H  . insulin aspart  0-15 Units Subcutaneous TID WC  . insulin aspart  0-5 Units Subcutaneous QHS  . insulin glargine  20 Units Subcutaneous BID  . ipratropium  0.5 mg Nebulization Q4H  . methylPREDNISolone (SOLU-MEDROL) injection  60 mg Intravenous Q12H  . pantoprazole  40 mg Oral Daily  . potassium chloride SA  20 mEq Oral Daily  . sertraline  50 mg Oral Daily  . sodium chloride  3 mL Intravenous Q12H  . spironolactone  25 mg Oral BID  . tiotropium  18 mcg Inhalation Daily  . torsemide  100 mg Oral BID   Continuous Infusions:   Principal Problem:   Acute-on-chronic respiratory failure Active Problems:   Acute on chronic diastolic heart failure   GERD   Diabetes type 2, uncontrolled    Time spent: 35 minutes    [name]  [facility] Pager [phone]. If 7PM-7AM, please contact night-coverage at [email or web address], password [facility] 08/03/2013, 9:31 AM  LOS: 0 days

## 2013-08-03 NOTE — H&P (Signed)
[facility] History and Physical  Patient: [name]  GNF:[id number]  DOB: 1966/09/25  DOS: 08/03/2013 PCP: Rene Paci, MD  Chief Complaint: Shortness of breath  HPI: [name] is a 47 y.o. female with Past medical history of morbid obesity, sleep apnea, hypertension, dyslipidemia, anxiety, pulmonary sarcoidosis, GERD.. The patient is coming from home. She presented today with the complaint of shortness of breath that started at around since last 2 days. She mentions that she has chronic shortness of breath and has been using 2 L of oxygen continuously but since last few days she has been having increasingly difficulty moving around the house due to shortness of breath. She denies any significant cough or fever, she denies any nausea or vomiting or abdominal pain there is no diarrhea. She does mention about orthopnea and PND. She denies any palpitation. She mentions she is compliant with all the medications and has not skipped any doses . Review of Systems: as mentioned in the history of present illness.  A Comprehensive review of the other systems is negative.  Past Medical History  Diagnosis Date  . Sleep apnea     noncompliant w/ CPAP  . Hypertension   . Hyperlipidemia   . Chronic headache   . Fibromyalgia     daily narcotics  . Anxiety     hx chronic BZ use, stopped 07/2010  . Anemia   . Pulmonary sarcoidosis     unimpressive CT chest 2011  . Colonic polyp   . GERD (gastroesophageal reflux disease)   . ALLERGIC RHINITIS   . Asthma   . CHF (congestive heart failure)     Diastolic with fluid overload, May, 2012, LVEF 60%  . Morbid obesity   . Depression   . Panic attacks   . Diabetes mellitus   . COPD (chronic obstructive pulmonary disease)     on home O2, moderate airflow obstruction, suspect d/t emphysema  . Obesity   . Elevated LFTs 09/2011  . Ovarian cyst   . Chronic back pain    Past Surgical History  Procedure Laterality Date  .  Polypectomy  2011  . Lumbar microdiscectomy  07/06/2011    R L4-5, stern  . Back surgery    . Carpal tunnel release    . Steroid spinal injections    . Hysteroscopy w/d&c N/A 03/25/2013    Procedure: DILATATION AND CURETTAGE /HYSTEROSCOPY;  Surgeon: Lavina Hamman, MD;  Location: [facility];  Service: Gynecology;  Laterality: N/A;  . Dilation and curettage of uterus     Social History:  reports that she quit smoking about 5 weeks ago. Her smoking use included Cigarettes. She has a 7.25 pack-year smoking history. She has never used smokeless tobacco. She reports that she drinks alcohol. She reports that she uses illicit drugs (Cocaine and Marijuana). Independent for most of her  ADL.  Allergies  Allergen Reactions  . Ciprofloxacin     REACTION: nausea  . Latex     REACTION: rash, burning  . Simvastatin Other (See Comments)    Severe leg pain and burning  . Sulfamethoxazole-Trimethoprim     Bactrim REACTION: Projectile Vomiting  . Azithromycin Other (See Comments)    REACTION: resistant  . Doxycycline Other (See Comments)    REACTION: restistant  . Metformin And Related Diarrhea    Severe diarrhea  . Metronidazole Nausea And Vomiting  . Zolpidem Tartrate     Chest pain    Family History  Problem Relation Age of Onset  .  Hypertension Mother   . Emphysema Father   . Hypertension Father   . Stomach cancer Father   . Allergies Brother   . Hypertension Brother   . Stomach cancer Brother   . Heart disease Mother   . Heart disease Father   . Heart disease Brother     died age 2 sudden death/MI    Prior to Admission medications   Medication Sig Start Date End Date Taking? Authorizing Provider  albuterol (PROVENTIL HFA;VENTOLIN HFA) 108 (90 BASE) MCG/ACT inhaler Inhale 2 puffs into the lungs every 6 (six) hours as needed for wheezing or shortness of breath.   Yes Historical Provider, MD  albuterol (PROVENTIL) (2.5 MG/3ML) 0.083% nebulizer solution Take 2.5 mg by nebulization every  6 (six) hours as needed for wheezing.   Yes Historical Provider, MD  ALPRAZolam Prudy Feeler) 1 MG tablet Take 1 mg by mouth 2 (two) times daily as needed for anxiety.   Yes Historical Provider, MD  aspirin EC 81 MG tablet Take 1 tablet (81 mg total) by mouth daily. 06/06/13  Yes Newt Lukes, MD  budesonide-formoterol (SYMBICORT) 160-4.5 MCG/ACT inhaler Inhale 2 puffs into the lungs 2 (two) times daily. 09/04/12  Yes Richarda Overlie, MD  cetirizine (ZYRTEC) 10 MG tablet Take 10 mg by mouth daily.   Yes Historical Provider, MD  esomeprazole (NEXIUM) 40 MG capsule Take 1 capsule (40 mg total) by mouth 2 (two) times daily. 01/23/13  Yes Newt Lukes, MD  fluticasone (FLONASE) 50 MCG/ACT nasal spray Place 2 sprays into both nostrils daily as needed for rhinitis. 08/01/13  Yes Newt Lukes, MD  gabapentin (NEURONTIN) 300 MG capsule Take 300 mg by mouth daily as needed. Take 1-2 tablets by mouth prn if nerve pain is real bad she will take two tablets   Yes Historical Provider, MD  glimepiride (AMARYL) 1 MG tablet Take 1 mg by mouth daily before breakfast.   Yes Historical Provider, MD  insulin aspart (NOVOLOG) 100 UNIT/ML injection Before each meal 3 times a day, 140-199 - 2 units, 200-250 - 4 units, 251-299 - 6 units,  300-349 - 8 units,  350 or above 10 units. Insulin PEN if approved, provide syringes and needles if needed. 07/02/13  Yes Leroy Sea, MD  insulin glargine (LANTUS) 100 UNIT/ML injection Inject 20 Units into the skin See admin instructions. Takes 20 units every evening. Also takes 20 units in the morning only if sugar is over 200 07/21/13  Yes Newt Lukes, MD  Oxycodone HCl 10 MG TABS Take 10 mg by mouth 3 (three) times daily as needed (pain).   Yes Historical Provider, MD  potassium chloride SA (K-DUR,KLOR-CON) 20 MEQ tablet Take 1 tablet (20 mEq total) by mouth daily. 07/04/13  Yes Newt Lukes, MD  sertraline (ZOLOFT) 50 MG tablet Take 1 tablet (50 mg total) by  mouth daily. 04/08/13  Yes Newt Lukes, MD  spironolactone (ALDACTONE) 25 MG tablet Take 1 tablet (25 mg total) by mouth 2 (two) times daily. 07/04/13  Yes Newt Lukes, MD  tiotropium (SPIRIVA) 18 MCG inhalation capsule Place 18 mcg into inhaler and inhale daily.   Yes Historical Provider, MD  tiZANidine (ZANAFLEX) 4 MG tablet Take 4 mg by mouth 3 (three) times daily as needed (muscle spasms).   Yes Historical Provider, MD  torsemide (DEMADEX) 100 MG tablet Take 100 mg by mouth 2 (two) times daily.   Yes Historical Provider, MD  B-D ULTRAFINE III SHORT PEN 31G  X 8 MM MISC USE TO ADMINISTER INSULIN IN SOLOSTAR PEN 07/20/13   Newt Lukes, MD  ONE TOUCH ULTRA TEST test strip USE AS DIRECTED 07/20/13   Newt Lukes, MD    Physical Exam: Filed Vitals:   08/03/13 0022 08/03/13 0050 08/03/13 0302 08/03/13 0347  BP: 116/83  132/65 110/85  Pulse: 110 117 104 105  Temp: 97.9 F (36.6 C)   98.5 F (36.9 C)  TempSrc: Oral   Oral  Resp: 22 26 17 21   Height: 5\' 8"  (1.727 m)   5\' 8"  (1.727 m)  Weight: 138.665 kg (305 lb 11.2 oz)   136.6 kg (301 lb 2.4 oz)  SpO2: 81% 100% 100% 91%    General: Alert, Awake and Oriented to Time, Place and Person. Appear in mild distress Eyes: PERRL ENT: Oral Mucosa clear dry. Neck: Difficult to assess  JVD Cardiovascular: S1 and S2 Present, no  Murmur, Peripheral Pulses Present Respiratory: Bilateral Air entry equal and Decreased, C not lear to Auscultation,  No  Crackles,bilateral basal   expiratory wheezes Abdomen: Bowel Sound Present, Soft and Non tender Skin: No  Rash Extremities: Bilateral trace  Pedal edema, no  calf tenderness Neurologic: Grossly Unremarkable.  Labs on Admission:  CBC:  Recent Labs Lab 08/03/13 0046  WBC 9.1  HGB 13.7  HCT 40.3  MCV 81.1  PLT 173    CMP     Component Value Date/Time   NA 133* 08/03/2013 0046   K 3.5 08/03/2013 0046   CL 88* 08/03/2013 0046   CO2 32 08/03/2013 0046   GLUCOSE 194*  08/03/2013 0046   BUN 17 08/03/2013 0046   CREATININE 1.20* 08/03/2013 0046   CALCIUM 9.3 08/03/2013 0046   PROT 8.1 07/16/2013 2104   ALBUMIN 3.8 07/16/2013 2104   AST 24 07/16/2013 2104   ALT 75* 07/16/2013 2104   ALKPHOS 98 07/16/2013 2104   BILITOT 0.8 07/16/2013 2104   GFRNONAA 53* 08/03/2013 0046   GFRAA 62* 08/03/2013 0046    No results found for this basename: LIPASE, AMYLASE,  in the last 168 hours No results found for this basename: AMMONIA,  in the last 168 hours  No results found for this basename: CKTOTAL, CKMB, CKMBINDEX, TROPONINI,  in the last 168 hours BNP (last 3 results)  Recent Labs  01/05/13 0455 06/24/13 1617 08/03/13 0046  PROBNP 15.0 61.2 2051.0*    Radiological Exams on Admission: Dg Chest Port 1 View  08/03/2013   CLINICAL DATA:  Shortness of breath  EXAM: PORTABLE CHEST - 1 VIEW  COMPARISON:  07/16/2013  FINDINGS: Normal heart size. Scarring is noted within both lung bases. There is mild diffuse edema identified. No airspace consolidation.  IMPRESSION: 1. Mild interstitial edema.  2. Bibasilar scarring.   Electronically Signed   By: Signa Kell M.D.   On: 08/03/2013 01:21    EKG: Independently reviewed. normal sinus rhythm, nonspecific ST and T waves changes.  Assessment/Plan Principal Problem:   Acute-on-chronic respiratory failure Active Problems:   Acute on chronic diastolic heart failure   GERD   Diabetes type 2, uncontrolled   1. Acute-on-chronic respiratory failure The patient is presenting with shortness of breath requiring BiPAP. She's is symptomatically feeling better after putting on BiPAP. She denies any confusion or nausea or vomiting.. With this we will admit her to the step down unit with BiPAP as needed, IV Solu-Medrol and IV antibiotics will be given . Duo nebs every 4 hours . Patient has been given  dose of Lasix in the ED for possible congestion as well. Holding home dose of antihypertensives for1 dose  2.Acute and chronic  diastolic heart failure  Patient has history of 65% EF one year ago and echocardiogram was done currently presenting with chest pain which was felt like pressure will obtain serial troponins and echocardiogram,   3.Diabetes mellitus  Place on sliding scale   4.GERD Continue Protonix  DVT Prophylaxis: subcutaneous Heparin Nutrition: As tolerated cardiac and diabetic  Code Status: Full  Disposition: Admitted to inpatient in step-down unit.  Author: Lynden Oxford, MD [facility] Pager: [phone] 08/03/2013, 3:55 AM    If 7PM-7AM, please contact night-coverage [email or web address] Password [email or web address]

## 2013-08-03 NOTE — ED Notes (Signed)
Pt. reports mid chest heaviness with SOB and occasional cough and slight nausea onset this evening.

## 2013-08-03 NOTE — Progress Notes (Signed)
47yo female c/o worsening SOB and chest tightness to begin IV ABX for bronchitis/COPD.  Will start Rocephin 1g IV Q24H.  Vernard Gambles, PharmD, BCPS 08/03/2013 3:51 AM

## 2013-08-03 NOTE — Progress Notes (Signed)
Spoke with [name] who spoke with pharmacy regarding azithromycin order for pt since the medication is ordered and she has a resistance listed to it. Order given to go ahead and administer. Will monitor pt for a signs or symptoms of intolerance. In no acute distress at this time.

## 2013-08-03 NOTE — Progress Notes (Signed)
ANTIBIOTIC CONSULT NOTE - FOLLOW UP  Pharmacy Consult for Rocephin  Indication: Bronchitis/COPD  Allergies  Allergen Reactions  . Ciprofloxacin     REACTION: nausea  . Latex     REACTION: rash, burning  . Simvastatin Other (See Comments)    Severe leg pain and burning  . Sulfamethoxazole-Trimethoprim     Bactrim REACTION: Projectile Vomiting  . Azithromycin Other (See Comments)    REACTION: resistant  . Doxycycline Other (See Comments)    REACTION: restistant  . Metformin And Related Diarrhea    Severe diarrhea  . Metronidazole Nausea And Vomiting  . Zolpidem Tartrate     Chest pain    Patient Measurements: Height: 5\' 8"  (172.7 cm) Weight: 301 lb 2.4 oz (136.6 kg) IBW/kg (Calculated) : 63.9   Vital Signs: Temp: 97.5 F (36.4 C) (11/09 0700) Temp src: Oral (11/09 0700) BP: 114/74 mmHg (11/09 0805) Pulse Rate: 94 (11/09 0805) Intake/Output from previous day: 11/08 0701 - 11/09 0700 In: -  Out: 1300 [Urine:1300] Intake/Output from this shift:    Labs:  Recent Labs  08/03/13 0046 08/03/13 0550  WBC 9.1 10.5  HGB 13.7 13.6  PLT 173 165  CREATININE 1.20* 1.02   Estimated Creatinine Clearance: 101.2 ml/min (by C-G formula based on Cr of 1.02). No results found for this basename: VANCOTROUGH, Leodis Binet, VANCORANDOM, GENTTROUGH, GENTPEAK, GENTRANDOM, TOBRATROUGH, TOBRAPEAK, TOBRARND, AMIKACINPEAK, AMIKACINTROU, AMIKACIN,  in the last 72 hours   Microbiology: Recent Results (from the past 720 hour(s))  MRSA PCR SCREENING     Status: None   Collection Time    08/03/13  6:48 AM      Result Value Range Status   MRSA by PCR NEGATIVE  NEGATIVE Final   Comment:            The GeneXpert MRSA Assay (FDA     approved for NASAL specimens     only), is one component of a     comprehensive MRSA colonization     surveillance program. It is not     intended to diagnose MRSA     infection nor to guide or     monitor treatment for     MRSA infections.     Anti-infectives   Start     Dose/Rate Route Frequency Ordered Stop   08/03/13 0600  cefTRIAXone (ROCEPHIN) 1 g in dextrose 5 % 50 mL IVPB     1 g 100 mL/hr over 30 Minutes Intravenous Every 24 hours 08/03/13 0350     08/03/13 0400  azithromycin (ZITHROMAX) 500 mg in dextrose 5 % 250 mL IVPB     500 mg 250 mL/hr over 60 Minutes Intravenous Every 24 hours 08/03/13 0345        Assessment: 47yo female presented with worsening SOB and chest tightness on 11/9.  Pharmacy is consulted to dose/monitor Rocephin for bronchitis/COPD.    PMH: obesity, sleep apnea, HTN, HLD, anxiety, pulmonary sarcoidosis, GERD, DM2, diastolic HF, anemia, COPD, fibromyalgia, chronic HA, back/knee pain, depression, allergic rhinitis  Azithromycin 11/9>> Rocephin 11/9>> MRSA screen neg  Goal of Therapy Resolution of possible infection  Plan:  -Start and Continue  Rocephin 1g IV Q24H -Continue Azithromycin 500 mg IV Q24H -Monitor for fever, wbc, Scr, Crcl, and as appropriate  Anabel Bene 08/03/2013,12:39 PM

## 2013-08-03 NOTE — Progress Notes (Signed)
  Echocardiogram 2D Echocardiogram has been performed.  [name] 08/03/2013, 10:17 AM

## 2013-08-04 ENCOUNTER — Inpatient Hospital Stay (HOSPITAL_COMMUNITY): Payer: PRIVATE HEALTH INSURANCE

## 2013-08-04 ENCOUNTER — Encounter (HOSPITAL_COMMUNITY): Payer: Self-pay | Admitting: *Deleted

## 2013-08-04 ENCOUNTER — Encounter: Payer: Self-pay | Admitting: Internal Medicine

## 2013-08-04 DIAGNOSIS — E662 Morbid (severe) obesity with alveolar hypoventilation: Secondary | ICD-10-CM

## 2013-08-04 DIAGNOSIS — D869 Sarcoidosis, unspecified: Secondary | ICD-10-CM

## 2013-08-04 DIAGNOSIS — J441 Chronic obstructive pulmonary disease with (acute) exacerbation: Secondary | ICD-10-CM

## 2013-08-04 DIAGNOSIS — F17200 Nicotine dependence, unspecified, uncomplicated: Secondary | ICD-10-CM

## 2013-08-04 LAB — GLUCOSE, CAPILLARY
Glucose-Capillary: 256 mg/dL — ABNORMAL HIGH (ref 70–99)
Glucose-Capillary: 318 mg/dL — ABNORMAL HIGH (ref 70–99)
Glucose-Capillary: 367 mg/dL — ABNORMAL HIGH (ref 70–99)
Glucose-Capillary: 421 mg/dL — ABNORMAL HIGH (ref 70–99)
Glucose-Capillary: 470 mg/dL — ABNORMAL HIGH (ref 70–99)

## 2013-08-04 LAB — CBC
HCT: 37.6 % (ref 36.0–46.0)
Hemoglobin: 12.5 g/dL (ref 12.0–15.0)
MCH: 26.7 pg (ref 26.0–34.0)
MCHC: 33.2 g/dL (ref 30.0–36.0)
MCV: 80.3 fL (ref 78.0–100.0)
Platelets: 161 K/uL (ref 150–400)
RBC: 4.68 MIL/uL (ref 3.87–5.11)
RDW: 18.1 % — ABNORMAL HIGH (ref 11.5–15.5)
WBC: 12.7 K/uL — ABNORMAL HIGH (ref 4.0–10.5)

## 2013-08-04 LAB — BASIC METABOLIC PANEL WITH GFR
BUN: 24 mg/dL — ABNORMAL HIGH (ref 6–23)
CO2: 29 meq/L (ref 19–32)
Calcium: 8.7 mg/dL (ref 8.4–10.5)
Chloride: 86 meq/L — ABNORMAL LOW (ref 96–112)
Creatinine, Ser: 0.91 mg/dL (ref 0.50–1.10)
GFR calc Af Amer: 86 mL/min — ABNORMAL LOW (ref >90)
GFR calc non Af Amer: 75 mL/min — ABNORMAL LOW (ref >90)
Glucose, Bld: 399 mg/dL — ABNORMAL HIGH (ref 70–99)
Potassium: 3.9 meq/L (ref 3.5–5.1)
Sodium: 131 meq/L — ABNORMAL LOW (ref 135–145)

## 2013-08-04 MED ORDER — LORAZEPAM 2 MG/ML IJ SOLN
1.0000 mg | INTRAMUSCULAR | Status: DC | PRN
  Administered 2013-08-04 – 2013-08-05 (×4): 1 mg via INTRAVENOUS
  Filled 2013-08-04 (×4): qty 1

## 2013-08-04 MED ORDER — ALBUTEROL SULFATE (5 MG/ML) 0.5% IN NEBU
2.5000 mg | INHALATION_SOLUTION | Freq: Four times a day (QID) | RESPIRATORY_TRACT | Status: DC
  Administered 2013-08-04 – 2013-08-05 (×6): 2.5 mg via RESPIRATORY_TRACT
  Filled 2013-08-04 (×6): qty 0.5

## 2013-08-04 MED ORDER — OXYCODONE HCL 5 MG PO TABS
10.0000 mg | ORAL_TABLET | Freq: Four times a day (QID) | ORAL | Status: DC | PRN
  Administered 2013-08-04 – 2013-08-07 (×9): 10 mg via ORAL
  Filled 2013-08-04 (×9): qty 2

## 2013-08-04 MED ORDER — IOHEXOL 350 MG/ML SOLN
100.0000 mL | Freq: Once | INTRAVENOUS | Status: AC | PRN
  Administered 2013-08-04: 100 mL via INTRAVENOUS

## 2013-08-04 MED ORDER — OXYCODONE HCL 10 MG PO TABS: 10.0000 mg | ORAL_TABLET | Freq: Three times a day (TID) | ORAL | Status: DC | PRN

## 2013-08-04 MED ORDER — INSULIN ASPART 100 UNIT/ML ~~LOC~~ SOLN
15.0000 [IU] | Freq: Once | SUBCUTANEOUS | Status: AC
  Administered 2013-08-04: 15 [IU] via SUBCUTANEOUS

## 2013-08-04 MED ORDER — INSULIN REGULAR BOLUS VIA INFUSION
0.0000 [IU] | Freq: Three times a day (TID) | INTRAVENOUS | Status: DC
  Filled 2013-08-04: qty 10

## 2013-08-04 MED ORDER — ALBUTEROL SULFATE (5 MG/ML) 0.5% IN NEBU
2.5000 mg | INHALATION_SOLUTION | RESPIRATORY_TRACT | Status: DC | PRN
  Administered 2013-08-06: 2.5 mg via RESPIRATORY_TRACT
  Filled 2013-08-04: qty 0.5

## 2013-08-04 MED ORDER — PREDNISONE 50 MG PO TABS
60.0000 mg | ORAL_TABLET | Freq: Every day | ORAL | Status: DC
  Administered 2013-08-04 – 2013-08-07 (×4): 60 mg via ORAL
  Filled 2013-08-04 (×5): qty 1

## 2013-08-04 MED ORDER — IPRATROPIUM BROMIDE 0.02 % IN SOLN
0.5000 mg | Freq: Four times a day (QID) | RESPIRATORY_TRACT | Status: DC
  Administered 2013-08-04: 0.5 mg via RESPIRATORY_TRACT
  Filled 2013-08-04 (×2): qty 2.5

## 2013-08-04 MED ORDER — DEXTROSE 50 % IV SOLN: 25.0000 mL | INTRAVENOUS | Status: DC | PRN

## 2013-08-04 MED ORDER — IPRATROPIUM BROMIDE 0.02 % IN SOLN: 0.5000 mg | RESPIRATORY_TRACT | Status: DC | PRN

## 2013-08-04 MED ORDER — SODIUM CHLORIDE 0.9 % IV SOLN
INTRAVENOUS | Status: DC
  Administered 2013-08-04: 19:00:00 via INTRAVENOUS

## 2013-08-04 MED ORDER — INSULIN GLARGINE 100 UNIT/ML ~~LOC~~ SOLN
30.0000 [IU] | Freq: Two times a day (BID) | SUBCUTANEOUS | Status: DC
  Administered 2013-08-04: 30 [IU] via SUBCUTANEOUS
  Filled 2013-08-04 (×2): qty 0.3

## 2013-08-04 MED ORDER — DEXTROSE-NACL 5-0.45 % IV SOLN: INTRAVENOUS | Status: DC

## 2013-08-04 MED ORDER — SODIUM CHLORIDE 0.9 % IV SOLN
INTRAVENOUS | Status: DC
  Administered 2013-08-04: 7.7 [IU]/h via INTRAVENOUS
  Administered 2013-08-04: 4.7 [IU]/h via INTRAVENOUS
  Administered 2013-08-05: 1.5 [IU]/h via INTRAVENOUS
  Filled 2013-08-04: qty 1

## 2013-08-04 MED ORDER — HYDROCODONE-ACETAMINOPHEN 5-325 MG PO TABS
1.0000 | ORAL_TABLET | Freq: Four times a day (QID) | ORAL | Status: DC | PRN
  Administered 2013-08-04: 1 via ORAL
  Filled 2013-08-04: qty 1

## 2013-08-04 MED ORDER — FLUCONAZOLE 100 MG PO TABS
100.0000 mg | ORAL_TABLET | Freq: Every day | ORAL | Status: DC
  Administered 2013-08-04 – 2013-08-05 (×2): 100 mg via ORAL
  Filled 2013-08-04 (×2): qty 1

## 2013-08-04 NOTE — Progress Notes (Signed)
[facility] PROGRESS NOTE  [name] ZOX:[id number] DOB: September 20, 1966 DOA: 08/03/2013 PCP: Rene Paci, MD  Assessment/Plan: 1. Acute on chronic hypoxemic respiratory failure. I suspect secondary to chronic obstructive pulmonary disease exacerbation which could have been precipitated by underlying infectious process. Will continue empiric IV antibiotic therapy with Rocephin and azithromycin. Given echo findings of right-sided heart failure, will check a CT scan PE study to assess for the possibility of underlying pulmonary embolism. Meanwhile will transition her to oral prednisone 60 mg by mouth daily, discontinuing IV Solu-Medrol.. 2. Chronic obstructive pulmonary disease exacerbation. Continue duo nebs, IV antibiotics, supplemental oxygen and supportive care. Changing over to oral prednisone today stopping IV steroids 3. Chronic diastolic congestive heart failure. Repeat echo showing EF of 65-70% with diastolic dysfunction. Continue diuretic therapy. 4. Right-sided heart failure. Transthoracic echocardiogram done yesterday showed RV volume and pressure overload with RV cavity size moderately dilated. This is likely secondary to obesity hypoventilation syndrome/OSA, pulmonary hypertension and COPD however given her presentation of worsening shortness of breath, will assess for possible pulmonary embolism, have ordered a CT scan PE study. 5. Chest tightness. Suspect secondary to COPD. Checking a CT scan PE study for possible pulmonary embolism. Troponins were cycled and remained negative. She reports feeling better today. 6. Type 2 diabetes mellitus. Her blood sugars remain elevated, likely secondary to steroid therapy. Will increase her Lantus from 20-30 units subcutaneous twice a day. Continue Accu-Cheks with sliding scale coverage 7. DVT prophylaxis. Heparin 5000 units subcutaneous 3 times a day  Code Status: Full code Disposition Plan: Plan to transfer patient to telemetry  today.  Antibiotics:  Ceftriaxone  Azithromycin  HPI/Subjective: Patient is a pleasant 47 year old female with a past medical history of COPD, chronic hypoxemic respiratory failure who uses 2 L supplemental oxygen at home, presented overnight with complaints of increasing shortness of breath. Chest x-ray did not show evidence of infiltrate. She was started on empiric antibiotic therapy, steroids, this morning having 5 L of supplemental oxygen requirement.  She reports feeling about the same today. Presently satting 98% on 5 L supplemental oxygen. Continues to have exertional dyspnea, although is tolerating by mouth intake, afebrile, hemodynamically stable. Blood sugars remain elevated.  Objective: Filed Vitals:   08/04/13 0800  BP:   Pulse:   Temp: 97.6 F (36.4 C)  Resp:     Intake/Output Summary (Last 24 hours) at 08/04/13 0911 Last data filed at 08/04/13 0429  Gross per 24 hour  Intake      0 ml  Output   1125 ml  Net  -1125 ml   Filed Weights   08/03/13 0022 08/03/13 0347 08/04/13 0427  Weight: 138.665 kg (305 lb 11.2 oz) 136.6 kg (301 lb 2.4 oz) 137.9 kg (304 lb 0.2 oz)    Exam:   General:  Patient is in no acute distress, awake alert oriented  Cardiovascular: Regular rate rhythm normal S1-S2  Respiratory: She has severely diminished breath sounds bilaterally, positive expiratory wheezing, few scattered crackles  Abdomen: Obese soft nontender nontender positive bowel  Musculoskeletal: 1-2+ bilateral extremity pitting edema  Data Reviewed: Basic Metabolic Panel:  Recent Labs Lab 08/03/13 0046 08/03/13 0550 08/04/13 0503  NA 133* 134* 131*  K 3.5 4.0 3.9  CL 88* 89* 86*  CO2 32 30 29  GLUCOSE 194* 299* 399*  BUN 17 18 24*  CREATININE 1.20* 1.02 0.91  CALCIUM 9.3 8.8 8.7   Liver Function Tests:  Recent Labs Lab 08/03/13 0550  AST 29  ALT 54*  ALKPHOS 109  BILITOT 0.9  PROT 8.0  ALBUMIN 3.6   No results found for this basename: LIPASE,  AMYLASE,  in the last 168 hours No results found for this basename: AMMONIA,  in the last 168 hours CBC:  Recent Labs Lab 08/03/13 0046 08/03/13 0550 08/04/13 0503  WBC 9.1 10.5 12.7*  HGB 13.7 13.6 12.5  HCT 40.3 41.2 37.6  MCV 81.1 81.3 80.3  PLT 173 165 161   Cardiac Enzymes:  Recent Labs Lab 08/03/13 0550 08/03/13 0900 08/03/13 1600  TROPONINI <0.30 <0.30 <0.30   BNP (last 3 results)  Recent Labs  01/05/13 0455 06/24/13 1617 08/03/13 0046  PROBNP 15.0 61.2 2051.0*   CBG:  Recent Labs Lab 08/03/13 0803 08/03/13 1228 08/03/13 1723 08/03/13 2129 08/04/13 0806  GLUCAP 293* 285* 355* 365* 367*    Recent Results (from the past 240 hour(s))  MRSA PCR SCREENING     Status: None   Collection Time    08/03/13  6:48 AM      Result Value Range Status   MRSA by PCR NEGATIVE  NEGATIVE Final   Comment:            The GeneXpert MRSA Assay (FDA     approved for NASAL specimens     only), is one component of a     comprehensive MRSA colonization     surveillance program. It is not     intended to diagnose MRSA     infection nor to guide or     monitor treatment for     MRSA infections.     Studies: Dg Chest Port 1 View  08/03/2013   CLINICAL DATA:  Shortness of breath  EXAM: PORTABLE CHEST - 1 VIEW  COMPARISON:  07/16/2013  FINDINGS: Normal heart size. Scarring is noted within both lung bases. There is mild diffuse edema identified. No airspace consolidation.  IMPRESSION: 1. Mild interstitial edema.  2. Bibasilar scarring.   Electronically Signed   By: Signa Kell M.D.   On: 08/03/2013 01:21    Scheduled Meds: . ipratropium  0.5 mg Nebulization QID   And  . albuterol  2.5 mg Nebulization QID  . aspirin EC  81 mg Oral Daily  . azithromycin  500 mg Intravenous Q24H  . budesonide-formoterol  2 puff Inhalation BID  . cefTRIAXone (ROCEPHIN)  IV  1 g Intravenous Q24H  . fluconazole  100 mg Oral Daily  . gabapentin  300 mg Oral QHS  . guaiFENesin  600 mg  Oral BID  . heparin  5,000 Units Subcutaneous Q8H  . insulin aspart  0-15 Units Subcutaneous TID WC  . insulin aspart  0-5 Units Subcutaneous QHS  . insulin glargine  30 Units Subcutaneous BID  . pantoprazole  40 mg Oral Daily  . potassium chloride SA  20 mEq Oral Daily  . predniSONE  60 mg Oral Q breakfast  . sertraline  50 mg Oral Daily  . sodium chloride  3 mL Intravenous Q12H  . spironolactone  25 mg Oral BID  . tiotropium  18 mcg Inhalation Daily  . torsemide  100 mg Oral BID   Continuous Infusions:   Principal Problem:   Acute-on-chronic respiratory failure Active Problems:   Acute on chronic diastolic heart failure   GERD   Diabetes type 2, uncontrolled    Time spent: 35 minutes    Jeralyn Bennett  [facility] Pager [phone]. If 7PM-7AM, please contact night-coverage at [email or web address], password [facility] 08/04/2013, 9:11 AM  LOS: 1 day

## 2013-08-04 NOTE — Progress Notes (Signed)
Dr. Vanessa Barbara informed of CBG 521; awaiting new orders via epic.Mamie Levers

## 2013-08-04 NOTE — Progress Notes (Signed)
Utilization review completed.  

## 2013-08-04 NOTE — Telephone Encounter (Signed)
rx printed and signed for pick up - on [name]'s desk

## 2013-08-04 NOTE — Evaluation (Signed)
Physical Therapy Evaluation Patient Details Name: [name] MRN: [id number] DOB: [birth year] Today's Date: 08/04/2013 Time: 4098-1191 PT Time Calculation (min): 23 min  PT Assessment / Plan / Recommendation History of Present Illness  Pt admitted for SOB. Pt with Acute on chronic hypoxemic respiratory failure. Uses 2-3 LO2 at home.  Clinical Impression  Pt with noted impaired endurance and decreased activity tolerance. Pt functioning at supervision/min guard level however con't to become SOB and have dec SpO2 into low 80s with mobility. Acute PT to con't to monitor for energy conservation and ambulation tolerance. Anticipate pt to be safe to d/c home with assist of [facility] aide as PTA. Pt to benefit from cardiopulmonary rehab.    PT Assessment  Patient needs continued PT services    Follow Up Recommendations  No PT follow up    Does the patient have the potential to tolerate intense rehabilitation      Barriers to Discharge        Equipment Recommendations  Rolling walker with 5" wheels    Recommendations for Other Services  (cardiopulmonary rehab)   Frequency Min 2X/week    Precautions / Restrictions Precautions Precautions: None Restrictions Weight Bearing Restrictions: No   Pertinent Vitals/Pain Denies pain      Mobility  Bed Mobility Bed Mobility: Supine to Sit Supine to Sit: 6: Modified independent (Device/Increase time) Transfers Transfers: Sit to Stand;Stand to Sit Sit to Stand: 6: Modified independent (Device/Increase time) Stand to Sit: 6: Modified independent (Device/Increase time) Ambulation/Gait Ambulation/Gait Assistance: 4: Min guard Ambulation Distance (Feet): 100 Feet Assistive device: Rolling walker Ambulation/Gait Assistance Details: pt with significant SOB and SpO2 in low 80s on 6Lo2 via [place]. RN aware and present Gait Pattern: Step-through pattern;Decreased stride length Gait velocity: slow    Exercises     PT Diagnosis: Difficulty walking   PT Problem List: Decreased activity tolerance;Cardiopulmonary status limiting activity PT Treatment Interventions: DME instruction;Gait training;Functional mobility training;Therapeutic activities;Therapeutic exercise     PT Goals(Current goals can be found in the care plan section) Acute Rehab PT Goals Patient Stated Goal: home for thanksgiving PT Goal Formulation: With patient Time For Goal Achievement: 08/11/13 Potential to Achieve Goals: Good  Visit Information  Last PT Received On: 08/04/13 Assistance Needed: +1 History of Present Illness: Pt admitted for SOB. Pt with Acute on chronic hypoxemic respiratory failure. Uses 2-3 LO2 at home.       Prior Functioning  Home Living Family/patient expects to be discharged to:: Private residence Living Arrangements: Alone Available Help at Discharge: Personal care attendant [facility] aide 2hrs/day M-F ) Type of Home: Apartment Home Access: Level entry Home Layout: One level Home Equipment: Shower seat Prior Function Level of Independence: Independent Communication Communication: No difficulties Dominant Hand: Right    Cognition  Cognition Arousal/Alertness: Awake/alert Behavior During Therapy: WFL for tasks assessed/performed Overall Cognitive Status: Within Functional Limits for tasks assessed    Extremity/Trunk Assessment Upper Extremity Assessment Upper Extremity Assessment: Overall WFL for tasks assessed Lower Extremity Assessment Lower Extremity Assessment: Overall WFL for tasks assessed Cervical / Trunk Assessment Cervical / Trunk Assessment: Normal   Balance    End of Session PT - End of Session Activity Tolerance:  (limited by cardiopulmonary status) Patient left: in chair;with call bell/phone within reach Nurse Communication: Mobility status  GP     Marcene Brawn 08/04/2013, 4:46 PM  Lewis Shock, PT, DPT Pager #: [phone] Office #: [phone]

## 2013-08-04 NOTE — Telephone Encounter (Signed)
The patient called from the hospital stating the nurse Herbert Seta was unable to give her pain medicine until it was approved by Dr.Leschber.  She is hoping to also get the medicine when she is discharged, but is hoping to get the medicine while she is in the hospital.  Her callback number while she is hospitalized is [phone].   Thanks!

## 2013-08-04 NOTE — Progress Notes (Signed)
Report called to receiving RN on 2w. Pt and all belonging were transferred to 2w 35 Via wheel chair accompanied by RN.

## 2013-08-04 NOTE — Care Management Note (Signed)
    Page 1 of 1   08/07/2013     2:15:01 PM   CARE MANAGEMENT NOTE 08/07/2013  Patient:  [name], [name]   Account Number:  [id number]  Date Initiated:  08/04/2013  Documentation initiated by:  Donn Pierini  Subjective/Objective Assessment:   Pt admitted with CHF/COPD     Action/Plan:   PTA pt lived at home-   Anticipated DC Date:  08/07/2013   Anticipated DC Plan:  HOME/SELF CARE      DC Planning Services  CM consult      Choice offered to / List presented to:             Status of service:  Completed, signed off Medicare Important Message given?   (If response is "NO", the following Medicare IM given date fields will be blank) Date Medicare IM given:   Date Additional Medicare IM given:    Discharge Disposition:  HOME/SELF CARE  Per UR Regulation:  Reviewed for med. necessity/level of care/duration of stay  If discussed at Long Length of Stay Meetings, dates discussed:    Comments:  08/07/13 Natanel Snavely,RN,BSN [phone] PT FOR DC HOME TODAY. OFFERED POSSIBLE HH FOLLOW UP FOR DISEASE MGMT AT DC.   PT REFUSED HHRN  FOLLOW UP FOR CHF MANAGEMENT.  STATES SHE HAS EVERYTHING SHE NEEDS AT HOME.  08/04/13- 1130- Donn Pierini RN, BSN [phone] In to speak with pt at bedside- per conversation pt states that she has all needed equipment at home - including home CPAP, home 02, nebulizer - home 02 is with [facility]- pe also has PCS 46 hr a mo through [facility]- aide comes out M-F. She uses [facility] to get her medications.

## 2013-08-04 NOTE — Telephone Encounter (Signed)
Let pt know that her high carbon dioxide level can be due to her copd, but also her weight.  Her abdominal size can push up on the chest and keep her from taking a deep breath.  It is called obesity hypoventilation syndrome.   They also need to make sure there is not a heart issue causing her shortness of breath.  Finally, make sure she isn't smoking even one cig.

## 2013-08-04 NOTE — Addendum Note (Signed)
Addended by: Rene Paci A on: 08/04/2013 05:36 PM   Modules accepted: Orders

## 2013-08-04 NOTE — Telephone Encounter (Signed)
Request for oxy refill noted. Patient currently hospitalized. Will refill upon discharge as needed

## 2013-08-05 DIAGNOSIS — J9620 Acute and chronic respiratory failure, unspecified whether with hypoxia or hypercapnia: Secondary | ICD-10-CM

## 2013-08-05 DIAGNOSIS — R7309 Other abnormal glucose: Secondary | ICD-10-CM

## 2013-08-05 DIAGNOSIS — F411 Generalized anxiety disorder: Secondary | ICD-10-CM

## 2013-08-05 LAB — GLUCOSE, CAPILLARY
Glucose-Capillary: 205 mg/dL — ABNORMAL HIGH (ref 70–99)
Glucose-Capillary: 130 mg/dL — ABNORMAL HIGH (ref 70–99)
Glucose-Capillary: 158 mg/dL — ABNORMAL HIGH (ref 70–99)
Glucose-Capillary: 360 mg/dL — ABNORMAL HIGH (ref 70–99)

## 2013-08-05 LAB — BASIC METABOLIC PANEL
BUN: 21 mg/dL (ref 6–23)
CO2: 33 mEq/L — ABNORMAL HIGH (ref 19–32)
Calcium: 8.3 mg/dL — ABNORMAL LOW (ref 8.4–10.5)
Chloride: 93 mEq/L — ABNORMAL LOW (ref 96–112)
Creatinine, Ser: 0.88 mg/dL (ref 0.50–1.10)
GFR calc Af Amer: 90 mL/min — ABNORMAL LOW (ref >90)
GFR calc non Af Amer: 78 mL/min — ABNORMAL LOW (ref >90)
Glucose, Bld: 147 mg/dL — ABNORMAL HIGH (ref 70–99)
Potassium: 3.4 mEq/L — ABNORMAL LOW (ref 3.5–5.1)
Sodium: 137 mEq/L (ref 135–145)

## 2013-08-05 MED ORDER — TORSEMIDE 100 MG PO TABS
100.0000 mg | ORAL_TABLET | Freq: Two times a day (BID) | ORAL | Status: DC
  Administered 2013-08-06 – 2013-08-07 (×3): 100 mg via ORAL
  Filled 2013-08-05 (×5): qty 1

## 2013-08-05 MED ORDER — ALPRAZOLAM 0.25 MG PO TABS
0.5000 mg | ORAL_TABLET | Freq: Two times a day (BID) | ORAL | Status: DC | PRN
  Administered 2013-08-05 – 2013-08-07 (×5): 0.5 mg via ORAL
  Filled 2013-08-05 (×5): qty 2

## 2013-08-05 MED ORDER — ALBUTEROL SULFATE (5 MG/ML) 0.5% IN NEBU
2.5000 mg | INHALATION_SOLUTION | Freq: Two times a day (BID) | RESPIRATORY_TRACT | Status: DC
  Administered 2013-08-05 – 2013-08-06 (×2): 2.5 mg via RESPIRATORY_TRACT
  Filled 2013-08-05 (×5): qty 0.5

## 2013-08-05 MED ORDER — INSULIN ASPART 100 UNIT/ML ~~LOC~~ SOLN
6.0000 [IU] | Freq: Three times a day (TID) | SUBCUTANEOUS | Status: DC
  Administered 2013-08-05 – 2013-08-06 (×4): 6 [IU] via SUBCUTANEOUS

## 2013-08-05 MED ORDER — SPIRONOLACTONE 25 MG PO TABS
25.0000 mg | ORAL_TABLET | Freq: Two times a day (BID) | ORAL | Status: DC
  Administered 2013-08-06 – 2013-08-07 (×3): 25 mg via ORAL
  Filled 2013-08-05 (×5): qty 1

## 2013-08-05 MED ORDER — INSULIN ASPART 100 UNIT/ML ~~LOC~~ SOLN
0.0000 [IU] | SUBCUTANEOUS | Status: DC
Start: 2013-08-05 — End: 2013-08-06
  Administered 2013-08-05: 15 [IU] via SUBCUTANEOUS
  Administered 2013-08-05 (×2): 7 [IU] via SUBCUTANEOUS
  Administered 2013-08-05: 21:00:00 via SUBCUTANEOUS
  Administered 2013-08-06: 4 [IU] via SUBCUTANEOUS
  Administered 2013-08-06: 3 [IU] via SUBCUTANEOUS
  Administered 2013-08-06: 4 [IU] via SUBCUTANEOUS

## 2013-08-05 MED ORDER — INSULIN GLARGINE 100 UNIT/ML ~~LOC~~ SOLN
30.0000 [IU] | Freq: Two times a day (BID) | SUBCUTANEOUS | Status: DC
  Administered 2013-08-05 – 2013-08-07 (×5): 30 [IU] via SUBCUTANEOUS
  Filled 2013-08-05 (×7): qty 0.3

## 2013-08-05 NOTE — Progress Notes (Signed)
[facility] PROGRESS NOTE  [name] ZOX:[id number] DOB: June 10, 1966 DOA: 08/03/2013 PCP: Rene Paci, MD   Interim summary Patient is a pleasant 47 year old female with a past medical history of chronic hypoxemic respiratory failure, on 2 L of supplemental oxygen at home, chronic obstructive pulmonary disease, history of tobacco abuse, chronic diastolic congestive heart failure, who was admitted to the medicine service on 08/03/2013. She presented with complaints of increasing cough and shortness of breath, initially requiring BiPAP. Initial chest x-ray showed mild diffuse edema, no airspace consolidation. Had severely diminished breath sounds as well as bilateral expiratory wheezing on initial exam. Symptoms were attributed to COPD exacerbation. She was started on IV steroids, empiric IV antibiotic therapy, duo nebs. CT scan of lungs with contrast not show evidence of pulmonary embolism. On the evening of 08/04/2013 she developed hyperglycemia with blood sugars in the 500 range. I suspect this is secondary to steroids. She was started on IV insulin, by the following morning blood sugars were controlled. I discontinued IV insulin and restart her back on Lantus 30 units subcutaneous twice a day. From a respiratory standpoint she is showing clinical improvement. Transitioned her from IV steroids to oral prednisone. Will also change her to oral antibiotic therapy, monitor the next 24 hours. Anticipate discharge in the next 24-48 hours.  Assessment/Plan: 1. Acute on chronic hypoxemic respiratory failure. I suspect secondary to chronic obstructive pulmonary disease exacerbation which could have been precipitated by underlying infectious process.CT scan of lungs with IV contrast performed yesterday negative for pulmonary embolism. Patient remaining afebrile, will transition to oral antibiotic therapy with Augmentin. She has multiple antibiotic drug allergies listed.  2. Chronic obstructive  pulmonary disease exacerbation. Continue prednisone 60 mg by mouth daily, duo nebs, supplemental oxygen 3. Chronic diastolic congestive heart failure. Repeat echo showing EF of 65-70% with diastolic dysfunction. Continue diuretic therapy. 4. Right-sided heart failure. Transthoracic echocardiogram showed RV volume and pressure overload with RV cavity size moderately dilated. I thinkthis is likely secondary to obesity hypoventilation syndrome/OSA, pulmonary hypertension and COPD.  CT scan of lungs with IV contrast negative for pulmonary embolism.. 5. Type 2 diabetes mellitus. Steroids likely causing rise in blood sugars and insulin requirement. Blood sugars better controlled this morning as she required to be placed on IV insulin overnight. Will restart Lantus at 30 units subcutaneous twice a day, provide sliding scale coverage, continue Accu-Cheks every 4 hours for today.  6. DVT prophylaxis. Heparin 5000 units subcutaneous 3 times a day  Code Status: Full code Disposition Plan: Plan for discharge in the next 24-48 hours if she continues to improve.Marland Kitchen  Antibiotics:  Ceftriaxone (discontinued 08/05/2013)  Azithromycin (discontinue 08/05/2013)  Augmentin ( started 08/06/2003)  HPI/Subjective: Blood sugars improved, stopping IV insulin and this morning restarting her basal insulin. Overall she reports feeling better, sitting up at bedtime I worked with physical therapy. Oxygen requirement coming down to 4 L. She remained afebrile, tolerating by mouth intake  Objective: Filed Vitals:   08/05/13 1012  BP: 131/64  Pulse: 91  Temp:   Resp:     Intake/Output Summary (Last 24 hours) at 08/05/13 1040 Last data filed at 08/05/13 1040  Gross per 24 hour  Intake   1080 ml  Output   1950 ml  Net   -870 ml   Filed Weights   08/03/13 0347 08/04/13 0427 08/05/13 0500  Weight: 136.6 kg (301 lb 2.4 oz) 137.9 kg (304 lb 0.2 oz) 137.1 kg (302 lb 4 oz)    Exam:  General:  Patient is in no acute  distress, awake alert oriented  Cardiovascular: Regular rate rhythm normal S1-S2  Respiratory: She has severely diminished breath sounds bilaterally, positive expiratory wheezing, few scattered crackles  Abdomen: Obese soft nontender nontender positive bowel  Musculoskeletal: 1-2+ bilateral extremity pitting edema  Data Reviewed: Basic Metabolic Panel:  Recent Labs Lab 08/03/13 0046 08/03/13 0550 08/04/13 0503 08/05/13 0745  NA 133* 134* 131* 137  K 3.5 4.0 3.9 3.4*  CL 88* 89* 86* 93*  CO2 32 30 29 33*  GLUCOSE 194* 299* 399* 147*  BUN 17 18 24* 21  CREATININE 1.20* 1.02 0.91 0.88  CALCIUM 9.3 8.8 8.7 8.3*   Liver Function Tests:  Recent Labs Lab 08/03/13 0550  AST 29  ALT 54*  ALKPHOS 109  BILITOT 0.9  PROT 8.0  ALBUMIN 3.6   No results found for this basename: LIPASE, AMYLASE,  in the last 168 hours No results found for this basename: AMMONIA,  in the last 168 hours CBC:  Recent Labs Lab 08/03/13 0046 08/03/13 0550 08/04/13 0503  WBC 9.1 10.5 12.7*  HGB 13.7 13.6 12.5  HCT 40.3 41.2 37.6  MCV 81.1 81.3 80.3  PLT 173 165 161   Cardiac Enzymes:  Recent Labs Lab 08/03/13 0550 08/03/13 0900 08/03/13 1600  TROPONINI <0.30 <0.30 <0.30   BNP (last 3 results)  Recent Labs  01/05/13 0455 06/24/13 1617 08/03/13 0046  PROBNP 15.0 61.2 2051.0*   CBG:  Recent Labs Lab 08/05/13 0409 08/05/13 0516 08/05/13 0625 08/05/13 0744 08/05/13 0856  GLUCAP 138* 130* 131* 158* 202*    Recent Results (from the past 240 hour(s))  MRSA PCR SCREENING     Status: None   Collection Time    08/03/13  6:48 AM      Result Value Range Status   MRSA by PCR NEGATIVE  NEGATIVE Final   Comment:            The GeneXpert MRSA Assay (FDA     approved for NASAL specimens     only), is one component of a     comprehensive MRSA colonization     surveillance program. It is not     intended to diagnose MRSA     infection nor to guide or     monitor treatment for      MRSA infections.     Studies: Ct Angio Chest Pe W/cm &/or Wo Cm  08/04/2013   CLINICAL DATA:  Short of breath, cough  EXAM: CT ANGIOGRAPHY CHEST WITH CONTRAST  TECHNIQUE: Multidetector CT imaging of the chest was performed using the standard protocol during bolus administration of intravenous contrast. Multiplanar CT image reconstructions including MIPs were obtained to evaluate the vascular anatomy.  CONTRAST:  OMNIPAQUE IOHEXOL 350 MG/ML SOLN  COMPARISON:  Chest x-ray 08/03/2013; prior CTA-PE chest 09/01/2012  FINDINGS: Mediastinum: Unremarkable CT appearance of the thyroid gland. No suspicious mediastinal or hilar adenopathy. No soft tissue mediastinal mass. The thoracic esophagus is unremarkable.  Heart/Vascular: Adequate opacification of the pulmonary arteries to the proximal subsegmental level. However, evaluation of the branches distal to the segmental arteries is limited by respiratory motion. No central filling defect to suggest acute pulmonary embolus. However, the main pulmonary artery is enlarged at nearly 3.8 cm in diameter in the central pulmonary arteries are also larger with a suggestion of peripheral pruning. The brachiocephalic and left common carotid arteries share a common trunk. Mild cardiomegaly with right atrial dilatation. No pericardial effusion.  Lungs/Pleura: Respiratory motion limits evaluation for small pulmonary nodules. There is moderately severe centrilobular emphysema. The non emphysematous portions of the lungs demonstrate diffuse mild ground-glass attenuation opacity. There is dependent atelectasis in both lower lobes as well as mild diffuse bronchial wall thickening in the bilateral lower lobes.  Bones/Soft Tissues: No acute fracture or aggressive appearing lytic or blastic osseous lesion.  Upper Abdomen: Diffuse hepatic steatosis  Review of the MIP images confirms the above findings.  IMPRESSION: 1. Negative for acute pulmonary embolus. 2. Markedly enlarged main  and central pulmonary arteries suggestive of pulmonary arterial hypertension. This is likely related to the patient's underlying emphysema/ COPD. 3. Moderately severe centrilobular emphysema. 4. The non emphysematous pulmonary parenchyma demonstrates diffuse mild ground-glass attenuation opacity. This could represent areas of active alveolitis (infectious, or inflammatory), or less likely mild edema. 5. Bronchial wall thickening in the bilateral lower lobes again noted consistent with chronic, or acute on chronic bronchitis. 6. Mild cardiomegaly with right atrial dilatation. 6. Hepatic steatosis.   Electronically Signed   By: Malachy Moan M.D.   On: 08/04/2013 11:36    Scheduled Meds: . albuterol  2.5 mg Nebulization QID  . aspirin EC  81 mg Oral Daily  . azithromycin  500 mg Intravenous Q24H  . budesonide-formoterol  2 puff Inhalation BID  . cefTRIAXone (ROCEPHIN)  IV  1 g Intravenous Q24H  . fluconazole  100 mg Oral Daily  . gabapentin  300 mg Oral QHS  . guaiFENesin  600 mg Oral BID  . heparin  5,000 Units Subcutaneous Q8H  . insulin aspart  0-20 Units Subcutaneous Q4H  . insulin glargine  30 Units Subcutaneous BID  . pantoprazole  40 mg Oral Daily  . potassium chloride SA  20 mEq Oral Daily  . predniSONE  60 mg Oral Q breakfast  . sertraline  50 mg Oral Daily  . sodium chloride  3 mL Intravenous Q12H  . spironolactone  25 mg Oral BID  . tiotropium  18 mcg Inhalation Daily  . torsemide  100 mg Oral BID   Continuous Infusions: . sodium chloride 50 mL/hr at 08/04/13 1847  . dextrose 5 % and 0.45% NaCl      Principal Problem:   Acute-on-chronic respiratory failure Active Problems:   Acute on chronic diastolic heart failure   GERD   Diabetes type 2, uncontrolled    Time spent: 35 minutes    Jeralyn Bennett  [facility] Pager [phone]. If 7PM-7AM, please contact night-coverage at [email or web address], password [email or web address] 08/05/2013, 10:40 AM  LOS: 2 days

## 2013-08-05 NOTE — Progress Notes (Signed)
CSW received referral that patient needs transportation. CSW went into room and introduced self to patient and explained reason for visit. Patient stated she was concerned about transportation back home. CSW asked patient if there was anyone that can come get her. Patient stated that she has someone who could come pick her up if we give her enough notice of a discharge date. CSW explained to patient that CSW is unsure of a discharge date at this time and encouraged patient to ask the medical team about an approximate discharge date. CSW explored different transportation options to patient. Patient stated she does not need the social worker's help and states she can get someone to pick her up. CSW signing off at this time. Please re consult if social work needs arise.  Maree Krabbe, MSW, Theresia Majors [phone]

## 2013-08-05 NOTE — Progress Notes (Signed)
Pt tampering with IV pump. Pt educated on the importance of not touching IV pump. IV pump panel locked.

## 2013-08-05 NOTE — Telephone Encounter (Signed)
Called pt no answer LMOM rx ready for pick-up.../lmb 

## 2013-08-05 NOTE — Progress Notes (Signed)
Pt's IV leaking. Site unusable. Insulin drip and maintenance fluids stopped. MD notified. IV team paged.

## 2013-08-05 NOTE — Progress Notes (Signed)
Patient is not ready to wear cpap at this time. 

## 2013-08-06 ENCOUNTER — Other Ambulatory Visit: Payer: Self-pay

## 2013-08-06 DIAGNOSIS — G4733 Obstructive sleep apnea (adult) (pediatric): Secondary | ICD-10-CM

## 2013-08-06 DIAGNOSIS — I5033 Acute on chronic diastolic (congestive) heart failure: Secondary | ICD-10-CM

## 2013-08-06 LAB — CBC
HCT: 38.3 % (ref 36.0–46.0)
MCH: 26.8 pg (ref 26.0–34.0)
MCHC: 32.6 g/dL (ref 30.0–36.0)
MCV: 82 fL (ref 78.0–100.0)
RDW: 19.2 % — ABNORMAL HIGH (ref 11.5–15.5)

## 2013-08-06 LAB — BASIC METABOLIC PANEL
BUN: 25 mg/dL — ABNORMAL HIGH (ref 6–23)
CO2: 29 mEq/L (ref 19–32)
Chloride: 91 mEq/L — ABNORMAL LOW (ref 96–112)
Creatinine, Ser: 1.05 mg/dL (ref 0.50–1.10)
Glucose, Bld: 177 mg/dL — ABNORMAL HIGH (ref 70–99)

## 2013-08-06 LAB — GLUCOSE, CAPILLARY
Glucose-Capillary: 110 mg/dL — ABNORMAL HIGH (ref 70–99)
Glucose-Capillary: 149 mg/dL — ABNORMAL HIGH (ref 70–99)
Glucose-Capillary: 208 mg/dL — ABNORMAL HIGH (ref 70–99)
Glucose-Capillary: 228 mg/dL — ABNORMAL HIGH (ref 70–99)

## 2013-08-06 MED ORDER — AMOXICILLIN-POT CLAVULANATE 875-125 MG PO TABS
1.0000 | ORAL_TABLET | Freq: Two times a day (BID) | ORAL | Status: DC
  Administered 2013-08-06 – 2013-08-07 (×2): 1 via ORAL
  Filled 2013-08-06 (×3): qty 1

## 2013-08-06 MED ORDER — INSULIN ASPART 100 UNIT/ML ~~LOC~~ SOLN
8.0000 [IU] | Freq: Three times a day (TID) | SUBCUTANEOUS | Status: DC
  Administered 2013-08-07 (×2): 8 [IU] via SUBCUTANEOUS

## 2013-08-06 MED ORDER — INSULIN ASPART 100 UNIT/ML ~~LOC~~ SOLN
0.0000 [IU] | Freq: Three times a day (TID) | SUBCUTANEOUS | Status: DC
  Administered 2013-08-06: 7 [IU] via SUBCUTANEOUS
  Administered 2013-08-07: 4 [IU] via SUBCUTANEOUS

## 2013-08-06 NOTE — Progress Notes (Signed)
Pt. Ambulated in the hall approximately 250 ft. Pt. Tolerated well on 4L/Anderson. O2 sat stayed at 90% during ambulation.

## 2013-08-06 NOTE — Progress Notes (Signed)
Patient complained of chest pain & a feeling a "pressure" mid chest while at rest. The pain quickly resolved. Did 12 lead EKG. EKG within normal limits. Normal sinus rhythm.  Will continue to monitor. Stanton Kidney R

## 2013-08-06 NOTE — Progress Notes (Signed)
Inpatient Diabetes Program Recommendations  AACE/ADA: New Consensus Statement on Inpatient Glycemic Control (2013)  Target Ranges:  Prepandial:   less than 140 mg/dL      Peak postprandial:   less than 180 mg/dL (1-2 hours)      Critically ill patients:  140 - 180 mg/dL     Results for DENIQUA, PERRY (MRN [id number]) as of 08/06/2013 09:34  Ref. Range 08/06/2013 00:43 08/06/2013 04:33 08/06/2013 08:06  Glucose-Capillary Latest Range: 70-99 mg/dL 409 (H) 811 (H) 914 (H)    **Patient transitioned off IV insulin drip yesterday.  Was started on Lantus 30 units bid + Novolog Resistant correction scale (SSI) Q4 hours + Novolog 6 units tidwc meal coverage.  **CBGs improved so far today. Noted A1c back in September was 6.2% (06/24/13) indicating good CBG control at home.  **MD- Please consider changing Novolog Resistant correction scale to tid ac + HS (patient eating 100% of diet).   Will follow. Ambrose Finland RN, MSN, CDE Diabetes Coordinator Inpatient Diabetes Program Team Pager: [phone] (8a-10p)

## 2013-08-06 NOTE — Progress Notes (Signed)
[facility] PROGRESS NOTE  [name] AOZ:[id number] DOB: 1966-01-09 DOA: 08/03/2013 PCP: Rene Paci, MD  Assessment/Plan: Patient is a pleasant 47 year old female with a past medical history of chronic hypoxemic respiratory failure, on 2 L of supplemental oxygen at home, chronic obstructive pulmonary disease, history of tobacco abuse, chronic diastolic congestive heart failure, who was admitted to the medicine service on 08/03/2013. She presented with complaints of increasing cough and shortness of breath, initially requiring BiPAP. Initial chest x-ray showed mild diffuse edema, no airspace consolidation. Had severely diminished breath sounds as well as bilateral expiratory wheezing on initial exam. Symptoms were attributed to COPD exacerbation. She was started on IV steroids, empiric IV antibiotic therapy, duo nebs. CT scan of lungs with contrast not show evidence of pulmonary embolism. On the evening of 08/04/2013 she developed hyperglycemia with blood sugars in the 500 range. I suspect this is secondary to steroids. She was started on IV insulin, by the following morning blood sugars were controlled. I discontinued IV insulin and restart her back on Lantus 30 units subcutaneous twice a day. From a respiratory standpoint she is showing clinical improvement. Transitioned her from IV steroids to oral prednisone. Will also change her to oral antibiotic therapy, monitor the next 24 hours. Anticipate discharge in the next 24-48 hours.   Assessment/Plan:  1. Acute on chronic hypoxemic respiratory failure. I suspect secondary to chronic obstructive pulmonary disease exacerbation which could have been precipitated by underlying infectious process.CT scan of lungs with IV contrast performed yesterday negative for pulmonary embolism. Patient remaining afebrile, will transition to oral antibiotic therapy with Augmentin. She has multiple antibiotic drug allergies listed. Start ambulating more today.   2. Chronic obstructive pulmonary disease exacerbation. Continue prednisone 60 mg by mouth daily, duo nebs, supplemental oxygen (weaning to home 2L/min) 3. Chronic diastolic congestive heart failure. Repeat echo showing EF of 65-70% with diastolic dysfunction. Continue diuretic therapy. 4. Right-sided heart failure. Transthoracic echocardiogram showed RV volume and pressure overload with RV cavity size moderately dilated. I thinkthis is likely secondary to obesity hypoventilation syndrome/OSA, pulmonary hypertension and COPD. CT scan of lungs with IV contrast negative for pulmonary embolism.. 5. Type 2 diabetes mellitus. Steroids likely causing rise in blood sugars and insulin requirement. Blood sugars better controlled this morning as she required to be placed on IV insulin overnight. Will restart Lantus at 30 units subcutaneous twice a day, provide sliding scale coverage, continue Accu-Cheks.   6. DVT prophylaxis. Heparin 5000 units subcutaneous 3 times a day  Code Status: Full code  Disposition Plan: Plan for discharge in the next 24-48 hours if she continues to improve.Marland Kitchen  HPI/Subjective: Pt reporting that she still has significant SOB with activity and still on 4L of oxygen, (at home on 2L), overall she is noticing improvement in her SOB.   Objective: Filed Vitals:   08/06/13 0353  BP: 125/81  Pulse: 98  Temp: 97.9 F (36.6 C)  Resp: 19    Intake/Output Summary (Last 24 hours) at 08/06/13 1328 Last data filed at 08/06/13 1100  Gross per 24 hour  Intake    243 ml  Output   5750 ml  Net  -5507 ml   Filed Weights   08/04/13 0427 08/05/13 0500 08/06/13 0353  Weight: 304 lb 0.2 oz (137.9 kg) 302 lb 4 oz (137.1 kg) 311 lb 1.1 oz (141.1 kg)    Exam:  General: Patient is in no acute distress, awake alert oriented  Cardiovascular: Regular rate rhythm normal S1-S2  Respiratory: She  has severely diminished breath sounds bilaterally, positive expiratory wheezing, few scattered crackles   Abdomen: Obese soft nontender nontender positive bowel  Musculoskeletal: 1-2+ bilateral extremity pitting edema  Data Reviewed: Basic Metabolic Panel:  Recent Labs Lab 08/03/13 0046 08/03/13 0550 08/04/13 0503 08/05/13 0745 08/06/13 0335  NA 133* 134* 131* 137 133*  K 3.5 4.0 3.9 3.4* 3.9  CL 88* 89* 86* 93* 91*  CO2 32 30 29 33* 29  GLUCOSE 194* 299* 399* 147* 177*  BUN 17 18 24* 21 25*  CREATININE 1.20* 1.02 0.91 0.88 1.05  CALCIUM 9.3 8.8 8.7 8.3* 9.0   Liver Function Tests:  Recent Labs Lab 08/03/13 0550  AST 29  ALT 54*  ALKPHOS 109  BILITOT 0.9  PROT 8.0  ALBUMIN 3.6   No results found for this basename: LIPASE, AMYLASE,  in the last 168 hours No results found for this basename: AMMONIA,  in the last 168 hours CBC:  Recent Labs Lab 08/03/13 0046 08/03/13 0550 08/04/13 0503 08/06/13 0335  WBC 9.1 10.5 12.7* 12.8*  HGB 13.7 13.6 12.5 12.5  HCT 40.3 41.2 37.6 38.3  MCV 81.1 81.3 80.3 82.0  PLT 173 165 161 187   Cardiac Enzymes:  Recent Labs Lab 08/03/13 0550 08/03/13 0900 08/03/13 1600  TROPONINI <0.30 <0.30 <0.30   BNP (last 3 results)  Recent Labs  01/05/13 0455 06/24/13 1617 08/03/13 0046  PROBNP 15.0 61.2 2051.0*   CBG:  Recent Labs Lab 08/05/13 2005 08/06/13 0043 08/06/13 0433 08/06/13 0806 08/06/13 1121  GLUCAP 179* 168* 149* 110* 184*    Recent Results (from the past 240 hour(s))  MRSA PCR SCREENING     Status: None   Collection Time    08/03/13  6:48 AM      Result Value Range Status   MRSA by PCR NEGATIVE  NEGATIVE Final   Comment:            The GeneXpert MRSA Assay (FDA     approved for NASAL specimens     only), is one component of a     comprehensive MRSA colonization     surveillance program. It is not     intended to diagnose MRSA     infection nor to guide or     monitor treatment for     MRSA infections.     Studies: No results found.  Scheduled Meds: . albuterol  2.5 mg Nebulization BID  .  aspirin EC  81 mg Oral Daily  . budesonide-formoterol  2 puff Inhalation BID  . gabapentin  300 mg Oral QHS  . guaiFENesin  600 mg Oral BID  . heparin  5,000 Units Subcutaneous Q8H  . insulin aspart  0-20 Units Subcutaneous TID WC  . insulin aspart  6 Units Subcutaneous TID WC  . insulin glargine  30 Units Subcutaneous BID  . pantoprazole  40 mg Oral Daily  . potassium chloride SA  20 mEq Oral Daily  . predniSONE  60 mg Oral Q breakfast  . sertraline  50 mg Oral Daily  . sodium chloride  3 mL Intravenous Q12H  . spironolactone  25 mg Oral BID  . tiotropium  18 mcg Inhalation Daily  . torsemide  100 mg Oral BID   Continuous Infusions:   Principal Problem:   Acute-on-chronic respiratory failure Active Problems:   Acute on chronic diastolic heart failure   GERD   Diabetes type 2, uncontrolled   [employer] [phone]. If  7PM-7AM, please contact night-coverage at [email or web address], password [facility] 08/06/2013, 1:28 PM  LOS: 3 days

## 2013-08-07 ENCOUNTER — Telehealth: Payer: Self-pay | Admitting: Pulmonary Disease

## 2013-08-07 DIAGNOSIS — R0789 Other chest pain: Secondary | ICD-10-CM

## 2013-08-07 DIAGNOSIS — J309 Allergic rhinitis, unspecified: Secondary | ICD-10-CM

## 2013-08-07 LAB — GLUCOSE, CAPILLARY
Glucose-Capillary: 145 mg/dL — ABNORMAL HIGH (ref 70–99)
Glucose-Capillary: 113 mg/dL — ABNORMAL HIGH (ref 70–99)
Glucose-Capillary: 168 mg/dL — ABNORMAL HIGH (ref 70–99)

## 2013-08-07 MED ORDER — MUCINEX DM MAXIMUM STRENGTH 60-1200 MG PO TB12: 1.0000 | ORAL_TABLET | Freq: Two times a day (BID) | ORAL | Status: DC

## 2013-08-07 MED ORDER — PREDNISONE 20 MG PO TABS: 40.0000 mg | ORAL_TABLET | Freq: Every day | ORAL | Status: DC

## 2013-08-07 MED ORDER — AMOXICILLIN-POT CLAVULANATE 875-125 MG PO TABS: 1.0000 | ORAL_TABLET | Freq: Two times a day (BID) | ORAL | Status: DC

## 2013-08-07 NOTE — Progress Notes (Signed)
Physical Therapy Treatment Patient Details Name: [name] MRN: [id number] DOB: 06/28/1966 Today's Date: 08/07/2013 Time: 4696-2952 PT Time Calculation (min): 24 min  PT Assessment / Plan / Recommendation  History of Present Illness Pt admitted for SOB. Pt with Acute on chronic hypoxemic respiratory failure. Uses 2-3 LO2 at home.   PT Comments   Pt met PT goals of mod I for gait.  Pt continues with decreased spO2 during gait but returns to >90% with standing or seated rest.  Pt educated on HEP for walking, activity tolerance and increased endurance.  Pt verbalizes understanding of HEP.  Pt is mod I with mobility and has no further PT needs at this time but would benefit from cardiopulmonary rehab for increased endurance and activity tolerance.  Follow Up Recommendations  No PT follow up;Other (comment) (Pt will benefit from cardiopulmonary rehab)     Does the patient have the potential to tolerate intense rehabilitation     Barriers to Discharge        Equipment Recommendations  None recommended by PT    Recommendations for Other Services    Frequency     Progress towards PT Goals Progress towards PT goals: Goals met/education completed, patient discharged from PT  Plan      Precautions / Restrictions Precautions Precautions: None Restrictions Weight Bearing Restrictions: No   Pertinent Vitals/Pain On 4L O2 during gait: spO286%, returned to 90% with standing rest x 30 seconds    Mobility  Bed Mobility Supine to Sit: 7: Independent Transfers Sit to Stand: 6: Modified independent (Device/Increase time) Stand to Sit: 6: Modified independent (Device/Increase time) Ambulation/Gait Ambulation/Gait Assistance: 6: Modified independent (Device/Increase time) Ambulation Distance (Feet): 200 Feet Assistive device: None Ambulation/Gait Assistance Details: spO2 86% during gait, returned to >90% with standing rest and deep breathing.  Pt on 4L O2    Exercises     PT  Diagnosis:    PT Problem List:   PT Treatment Interventions:     PT Goals (current goals can now be found in the care plan section)    Visit Information  Last PT Received On: 08/07/13 Assistance Needed: +1 History of Present Illness: Pt admitted for SOB. Pt with Acute on chronic hypoxemic respiratory failure. Uses 2-3 LO2 at home.    Subjective Data      Cognition  Cognition Arousal/Alertness: Awake/alert Behavior During Therapy: WFL for tasks assessed/performed Overall Cognitive Status: Within Functional Limits for tasks assessed    Balance  Dynamic Standing Balance Dynamic Standing - Balance Support: During functional activity Dynamic Standing - Level of Assistance: 6: Modified independent (Device/Increase time)  End of Session PT - End of Session Activity Tolerance: Patient tolerated treatment well Patient left: in chair;with call bell/phone within reach Nurse Communication: Mobility status   GP     [name] 08/07/2013, 7:54 AM

## 2013-08-07 NOTE — Discharge Summary (Addendum)
Physician Discharge Summary  [name] ZOX:[id number] DOB: 11/20/1965 DOA: 08/03/2013  PCP: Rene Paci, MD  Admit date: 08/03/2013 Discharge date: 08/07/2013  Recommendations for Outpatient Follow-up:  1. Wean oxygen as needed back to 2L/min as appropriate 2. Monitor Blood Glucose as pt is on steroid taper 3. Please arrange for outpatient cardiopulmonary rehab for patient   Discharge Diagnoses:  Principal Problem:   Acute-on-chronic respiratory failure Active Problems:   Acute on chronic diastolic heart failure   GERD   Diabetes type 2, uncontrolled   Discharge Condition: stable  Diet recommendation: carb modified   Filed Weights   08/05/13 0500 08/06/13 0353 08/07/13 0425  Weight: 302 lb 4 oz (137.1 kg) 311 lb 1.1 oz (141.1 kg) 300 lb 6.4 oz (136.261 kg)    History of present illness:  Patient is a pleasant 47 year old female with a past medical history of chronic hypoxemic respiratory failure, on 2 L of supplemental oxygen at home, chronic obstructive pulmonary disease, history of tobacco abuse, chronic diastolic congestive heart failure, who was admitted to the medicine service on 08/03/2013. She presented with complaints of increasing cough and shortness of breath, initially requiring BiPAP. Initial chest x-ray showed mild diffuse edema, no airspace consolidation. Had severely diminished breath sounds as well as bilateral expiratory wheezing on initial exam. Symptoms were attributed to COPD exacerbation. She was started on IV steroids, empiric IV antibiotic therapy, duo nebs. CT scan of lungs with contrast not show evidence of pulmonary embolism. On the evening of 08/04/2013 she developed hyperglycemia with blood sugars in the 500 range. I suspect this is secondary to steroids. She was started on IV insulin, by the following morning blood sugars were controlled. I discontinued IV insulin and restart her back on Lantus 30 units subcutaneous twice a day. From a respiratory  standpoint she is showing clinical improvement. Transitioned her from IV steroids to oral prednisone. Will also change her to oral antibiotic therapy, monitor the next 24 hours. Hospital Course: 1. Acute on chronic hypoxemic respiratory failure. I suspect secondary to chronic obstructive pulmonary disease exacerbation which could have been precipitated by underlying infectious process.CT scan of lungs with IV contrast performed yesterday negative for pulmonary embolism. Patient remaining afebrile, will transition to oral augmentin therapy. She has multiple antibiotic drug allergies listed. Pt is ambulating much better today and reports that she feels like she could go home.  2. Chronic obstructive pulmonary disease exacerbation. Continue prednisone 40 mg by mouth daily, nebs at home, supplemental oxygen 4L/min 3. Chronic diastolic congestive heart failure. Repeat echo showing EF of 65-70% with diastolic dysfunction. Continue diuretic therapy. 4. Right-sided heart failure. Transthoracic echocardiogram showed RV volume and pressure overload with RV cavity size moderately dilated. I thinkthis is likely secondary to obesity hypoventilation syndrome/OSA, pulmonary hypertension and COPD. CT scan of lungs with IV contrast negative for pulmonary embolism.. 5. Type 2 diabetes mellitus. Steroids likely causing rise in blood sugars and insulin requirement. Blood sugars better controlled this morning as she required to be placed on IV insulin overnight. Will restart Lantus at 30 units subcutaneous twice a day, provide sliding scale coverage, continue Accu-Cheks.  6. DVT prophylaxis. Heparin 5000 units subcutaneous 3 times a day used in hospital.    Discharge Exam: Pt reporting that she is feeling much better.  No CP, No SOB and ambulating the halls much better.   Filed Vitals:   08/07/13 0425  BP: 132/72  Pulse: 74  Temp: 98 F (36.7 C)  Resp: 18  General: awake, alert, no distress,  cooperative Cardiovascular: normal s1, s2 sounds Respiratory: BBS shallow, no wheezes or crackles heard    Discharge Instructions  Discharge Orders   Future Appointments Provider Department Dept Phone   08/15/2013 3:00 PM Luis Abed, MD [facility] [place] [facility] [phone]   10/14/2013 1:30 PM Barbaraann Share, MD [facility] [phone]   Future Orders Complete By Expires   Diet - low sodium heart healthy  As directed    Discharge instructions  As directed    Comments:     Monitor blood sugar closely at least 4 times per day and report blood sugar readings to primary care physician See your lung pulmonologist specialist in 1 week for hospital follow up  Use your CPAP at home as directed Continue your oxygen at 4 L / min and wean down as tolerated to 2 L/min with help from your pulmonologist and primary care physician Return if symptoms recur, worsen or new problems develop.   Discontinue IV  As directed    Increase activity slowly  As directed        Medication List         albuterol (2.5 MG/3ML) 0.083% nebulizer solution  Commonly known as:  PROVENTIL  Take 2.5 mg by nebulization every 6 (six) hours as needed for wheezing.     albuterol 108 (90 BASE) MCG/ACT inhaler  Commonly known as:  PROVENTIL HFA;VENTOLIN HFA  Inhale 2 puffs into the lungs every 6 (six) hours as needed for wheezing or shortness of breath.     ALPRAZolam 1 MG tablet  Commonly known as:  XANAX  Take 1 mg by mouth 2 (two) times daily as needed for anxiety.     amoxicillin-clavulanate 875-125 MG per tablet  Commonly known as:  AUGMENTIN  Take 1 tablet by mouth every 12 (twelve) hours.     aspirin EC 81 MG tablet  Take 1 tablet (81 mg total) by mouth daily.     B-D ULTRAFINE III SHORT PEN 31G X 8 MM Misc  Generic drug:  Insulin Pen Needle  USE TO ADMINISTER INSULIN IN SOLOSTAR PEN     budesonide-formoterol 160-4.5 MCG/ACT inhaler  Commonly known as:  SYMBICORT  Inhale 2 puffs  into the lungs 2 (two) times daily.     cetirizine 10 MG tablet  Commonly known as:  ZYRTEC  Take 10 mg by mouth daily.     esomeprazole 40 MG capsule  Commonly known as:  NEXIUM  Take 1 capsule (40 mg total) by mouth 2 (two) times daily.     fluticasone 50 MCG/ACT nasal spray  Commonly known as:  FLONASE  Place 2 sprays into both nostrils daily as needed for rhinitis.     gabapentin 300 MG capsule  Commonly known as:  NEURONTIN  Take 300 mg by mouth daily as needed. Take 1-2 tablets by mouth prn if nerve pain is real bad she will take two tablets     glimepiride 1 MG tablet  Commonly known as:  AMARYL  Take 1 mg by mouth daily before breakfast.     insulin aspart 100 UNIT/ML injection  Commonly known as:  novoLOG  - Before each meal 3 times a day, 140-199 - 2 units, 200-250 - 4 units, 251-299 - 6 units,  300-349 - 8 units,  350 or above 10 units.  - Insulin PEN if approved, provide syringes and needles if needed.     insulin glargine 100 UNIT/ML injection  Commonly known as:  LANTUS  - Inject 20 Units into the skin See admin instructions. Takes 20 units every evening.  - Also takes 20 units in the morning only if sugar is over 200     MUCINEX DM MAXIMUM STRENGTH 60-1200 MG Tb12  Take 1 tablet by mouth every 12 (twelve) hours.     ONE TOUCH ULTRA TEST test strip  Generic drug:  glucose blood  USE AS DIRECTED     Oxycodone HCl 10 MG Tabs  Take 1 tablet (10 mg total) by mouth 3 (three) times daily as needed (pain).     potassium chloride SA 20 MEQ tablet  Commonly known as:  K-DUR,KLOR-CON  Take 1 tablet (20 mEq total) by mouth daily.     predniSONE 20 MG tablet  Commonly known as:  DELTASONE  Take 2 tablets (40 mg total) by mouth daily with breakfast.     sertraline 50 MG tablet  Commonly known as:  ZOLOFT  Take 1 tablet (50 mg total) by mouth daily.     spironolactone 25 MG tablet  Commonly known as:  ALDACTONE  Take 1 tablet (25 mg total) by mouth 2 (two)  times daily.     tiotropium 18 MCG inhalation capsule  Commonly known as:  SPIRIVA  Place 18 mcg into inhaler and inhale daily.     tiZANidine 4 MG tablet  Commonly known as:  ZANAFLEX  Take 4 mg by mouth 3 (three) times daily as needed (muscle spasms).     torsemide 100 MG tablet  Commonly known as:  DEMADEX  Take 100 mg by mouth 2 (two) times daily.       Allergies  Allergen Reactions  . Ciprofloxacin     REACTION: nausea  . Latex     REACTION: rash, burning  . Simvastatin Other (See Comments)    Severe leg pain and burning  . Sulfamethoxazole-Trimethoprim     Bactrim REACTION: Projectile Vomiting  . Azithromycin Other (See Comments)    REACTION: resistant  . Doxycycline Other (See Comments)    REACTION: restistant  . Metformin And Related Diarrhea    Severe diarrhea  . Metronidazole Nausea And Vomiting  . Zolpidem Tartrate     Chest pain       Follow-up Information   Follow up with Rene Paci, MD. Schedule an appointment as soon as possible for a visit in 2 weeks.   Specialty:  Internal Medicine   Contact information:   [address] [place] [address] [phone]       Follow up with Barbaraann Share, MD. Schedule an appointment as soon as possible for a visit in 1 week. [facility] follow Up )    Specialty:  Pulmonary Disease   Contact information:   [address] [place] [address] [phone]      The results of significant diagnostics from this hospitalization (including imaging, microbiology, ancillary and laboratory) are listed below for reference.    Significant Diagnostic Studies: Dg Chest 2 View  07/16/2013   CLINICAL DATA:  Chest pain, abdominal pain. History of sarcoidosis.  EXAM: CHEST  2 VIEW  COMPARISON:  For 07/11/2013  FINDINGS: Chronic areas of scarring in the lung bases bilaterally. Chronic peribronchial thickening. Heart is borderline in size. No effusions or edema. No acute bony abnormality.   IMPRESSION: Chronic bronchitic changes and scarring. No acute findings.   Electronically Signed   By: Charlett Nose M.D.  On: 07/16/2013 20:31   Ct Angio Chest Pe W/cm &/or Wo Cm  08/04/2013   CLINICAL DATA:  Short of breath, cough  EXAM: CT ANGIOGRAPHY CHEST WITH CONTRAST  TECHNIQUE: Multidetector CT imaging of the chest was performed using the standard protocol during bolus administration of intravenous contrast. Multiplanar CT image reconstructions including MIPs were obtained to evaluate the vascular anatomy.  CONTRAST:  OMNIPAQUE IOHEXOL 350 MG/ML SOLN  COMPARISON:  Chest x-ray 08/03/2013; prior CTA-PE chest 09/01/2012  FINDINGS: Mediastinum: Unremarkable CT appearance of the thyroid gland. No suspicious mediastinal or hilar adenopathy. No soft tissue mediastinal mass. The thoracic esophagus is unremarkable.  Heart/Vascular: Adequate opacification of the pulmonary arteries to the proximal subsegmental level. However, evaluation of the branches distal to the segmental arteries is limited by respiratory motion. No central filling defect to suggest acute pulmonary embolus. However, the main pulmonary artery is enlarged at nearly 3.8 cm in diameter in the central pulmonary arteries are also larger with a suggestion of peripheral pruning. The brachiocephalic and left common carotid arteries share a common trunk. Mild cardiomegaly with right atrial dilatation. No pericardial effusion.  Lungs/Pleura: Respiratory motion limits evaluation for small pulmonary nodules. There is moderately severe centrilobular emphysema. The non emphysematous portions of the lungs demonstrate diffuse mild ground-glass attenuation opacity. There is dependent atelectasis in both lower lobes as well as mild diffuse bronchial wall thickening in the bilateral lower lobes.  Bones/Soft Tissues: No acute fracture or aggressive appearing lytic or blastic osseous lesion.  Upper Abdomen: Diffuse hepatic steatosis  Review of the MIP images  confirms the above findings.  IMPRESSION: 1. Negative for acute pulmonary embolus. 2. Markedly enlarged main and central pulmonary arteries suggestive of pulmonary arterial hypertension. This is likely related to the patient's underlying emphysema/ COPD. 3. Moderately severe centrilobular emphysema. 4. The non emphysematous pulmonary parenchyma demonstrates diffuse mild ground-glass attenuation opacity. This could represent areas of active alveolitis (infectious, or inflammatory), or less likely mild edema. 5. Bronchial wall thickening in the bilateral lower lobes again noted consistent with chronic, or acute on chronic bronchitis. 6. Mild cardiomegaly with right atrial dilatation. 6. Hepatic steatosis.   Electronically Signed   By: Malachy Moan M.D.   On: 08/04/2013 11:36   US Abdomen Complete  07/16/2013   CLINICAL DATA:  Right upper quadrant pain  EXAM: ULTRASOUND ABDOMEN COMPLETE  COMPARISON:  CT abdomen 10/12/2011  FINDINGS: Gallbladder  No gallstones or wall thickening visualized. No sonographic Murphy sign noted.  Common bile duct  Diameter: 3.7 mm  Liver  Hepatomegaly with increased echogenicity of the liver. No focal liver lesion  IVC  No abnormality visualized.  Pancreas  Visualized portion unremarkable.  Spleen  Size and appearance within normal limits.  Right Kidney  Length: 11.6 cm. Echogenicity within normal limits. No mass or hydronephrosis visualized.  Left Kidney  Length: 11.9 cm . Echogenicity within normal limits. No mass or hydronephrosis visualized.  Abdominal aorta  No aneurysm visualized.  IMPRESSION: Negative for gallstones. Diffuse liver disease suggesting fatty infiltration. No acute abnormality.   Electronically Signed   By: Marlan Palau M.D.   On: 07/16/2013 23:25   Dg Chest Port 1 View  08/03/2013   CLINICAL DATA:  Shortness of breath  EXAM: PORTABLE CHEST - 1 VIEW  COMPARISON:  07/16/2013  FINDINGS: Normal heart size. Scarring is noted within both lung bases. There is mild  diffuse edema identified. No airspace consolidation.  IMPRESSION: 1. Mild interstitial edema.  2. Bibasilar scarring.   Electronically  Signed   By: Signa Kell M.D.   On: 08/03/2013 01:21   Dg Chest Portable 1 View  07/11/2013   CLINICAL DATA:  Shortness of breath  EXAM: PORTABLE CHEST - 1 VIEW  COMPARISON:  07/01/2013  FINDINGS: No cardiomegaly. Chronic coarse interstitial opacities at the bases. No superimposed opacity. No effusion or pneumothorax. No acute osseous findings.  IMPRESSION: Chronic lung scarring. No evidence of acute superimposed disease.   Electronically Signed   By: Tiburcio Pea M.D.   On: 07/11/2013 23:01   Microbiology: Recent Results (from the past 240 hour(s))  MRSA PCR SCREENING     Status: None   Collection Time    08/03/13  6:48 AM      Result Value Range Status   MRSA by PCR NEGATIVE  NEGATIVE Final   Comment:            The GeneXpert MRSA Assay (FDA     approved for NASAL specimens     only), is one component of a     comprehensive MRSA colonization     surveillance program. It is not     intended to diagnose MRSA     infection nor to guide or     monitor treatment for     MRSA infections.   Labs: Basic Metabolic Panel:  Recent Labs Lab 08/03/13 0046 08/03/13 0550 08/04/13 0503 08/05/13 0745 08/06/13 0335  NA 133* 134* 131* 137 133*  K 3.5 4.0 3.9 3.4* 3.9  CL 88* 89* 86* 93* 91*  CO2 32 30 29 33* 29  GLUCOSE 194* 299* 399* 147* 177*  BUN 17 18 24* 21 25*  CREATININE 1.20* 1.02 0.91 0.88 1.05  CALCIUM 9.3 8.8 8.7 8.3* 9.0   Liver Function Tests:  Recent Labs Lab 08/03/13 0550  AST 29  ALT 54*  ALKPHOS 109  BILITOT 0.9  PROT 8.0  ALBUMIN 3.6   No results found for this basename: LIPASE, AMYLASE,  in the last 168 hours No results found for this basename: AMMONIA,  in the last 168 hours CBC:  Recent Labs Lab 08/03/13 0046 08/03/13 0550 08/04/13 0503 08/06/13 0335  WBC 9.1 10.5 12.7* 12.8*  HGB 13.7 13.6 12.5 12.5  HCT  40.3 41.2 37.6 38.3  MCV 81.1 81.3 80.3 82.0  PLT 173 165 161 187   Cardiac Enzymes:  Recent Labs Lab 08/03/13 0550 08/03/13 0900 08/03/13 1600  TROPONINI <0.30 <0.30 <0.30   BNP: BNP (last 3 results)  Recent Labs  01/05/13 0455 06/24/13 1617 08/03/13 0046  PROBNP 15.0 61.2 2051.0*   CBG:  Recent Labs Lab 08/06/13 1959 08/06/13 2345 08/07/13 0424 08/07/13 0700 08/07/13 1101  GLUCAP 228* 208* 145* 113* 168*   Signed:  Tiney Zipper  [facility] 08/07/2013, 1:24 PM

## 2013-08-07 NOTE — Telephone Encounter (Signed)
Pt returned call.  Advised appt w/ [facility] for Monday, 08/11/13 @ 3:00 pm.  Pt verbalized understanding & states nothing further needed at this time.  [name]

## 2013-08-07 NOTE — Telephone Encounter (Signed)
lmomtcb x1 for pt appt scheduled for 08/11/13 at 3:00 w/ [facility]

## 2013-08-07 NOTE — Telephone Encounter (Signed)
Message copied from MyChart:    Appointment Request From: [name]  With Provider: Barbaraann Share, MD [facility]]  Preferred Date Range: From 08/13/2013 To 08/14/2013  Preferred Times: Wednesday Afternoon, Thursday Afternoon  Reason for visit: Acute Office Visit  Comments: i need to have a follow up appointment from being released from the hospital. If Dr. Shelle Iron is not available I can see Tammy if she can see me.

## 2013-08-07 NOTE — Progress Notes (Signed)
Wet over discharge instructions with the patient. Patient had no additional questions or concerns related to discharge instructions. IV was D/C/d and patient taken off the cardiac monitor. Patient discharged home. Stanton Kidney R

## 2013-08-11 ENCOUNTER — Inpatient Hospital Stay: Payer: Self-pay | Admitting: Pulmonary Disease

## 2013-08-11 ENCOUNTER — Telehealth: Payer: Self-pay | Admitting: General Practice

## 2013-08-11 NOTE — Telephone Encounter (Signed)
Transitional care call:  Patient dc'd from hospital on 11/13.  Discharge diagnosis:  Acute on chronic respiratory failure.  Spoke with patient.  Patient states that she is doing OK but is having balance issue.  RN inquired as to throw rugs etc. And instructed patient to remove anything she could fall on and to be conscious of oxygen tubing.  Patient also states that blood sugar has been up and down and that she went back to prior dosage of insulin after discharge.  Patient reported that glucose was 369 on Saturday evening but that fasting glucose this AM was 94.  RN instructed patient to continue checks 4 times daily, record results in a log and bring to follow up appointment.  Recommended that patient follow discharge instructions regarding insulin dosing.  Patient reports that she has all medications in the home but that she needs a re-fill on Zanaflex.  Patient verbalized understanding.  Informed patient that I would direct all concerns to Dr. Felicity Coyer.  Follow up appointment made for Monday 08/18/2013.

## 2013-08-13 ENCOUNTER — Other Ambulatory Visit: Payer: Self-pay | Admitting: Internal Medicine

## 2013-08-13 MED ORDER — CETIRIZINE HCL 1 MG/ML PO SYRP: ORAL_SOLUTION | ORAL | Status: DC

## 2013-08-15 ENCOUNTER — Encounter: Payer: Self-pay | Admitting: Cardiology

## 2013-08-15 ENCOUNTER — Ambulatory Visit (INDEPENDENT_AMBULATORY_CARE_PROVIDER_SITE_OTHER): Payer: PRIVATE HEALTH INSURANCE | Admitting: Cardiology

## 2013-08-15 VITALS — BP 124/82 | HR 79 | Wt 297.0 lb

## 2013-08-15 DIAGNOSIS — I5032 Chronic diastolic (congestive) heart failure: Secondary | ICD-10-CM | POA: Insufficient documentation

## 2013-08-15 DIAGNOSIS — J9610 Chronic respiratory failure, unspecified whether with hypoxia or hypercapnia: Secondary | ICD-10-CM

## 2013-08-15 DIAGNOSIS — I509 Heart failure, unspecified: Secondary | ICD-10-CM

## 2013-08-15 NOTE — Assessment & Plan Note (Signed)
The patient has severe respiratory disease with severe pulmonary hypertension. This causes significant right jugular dysfunction. She needs to be on her diuretics to help maintain her volume status.

## 2013-08-15 NOTE — Patient Instructions (Signed)
**Note De-identified  Obfuscation** Your physician recommends that you continue on your current medications as directed. Please refer to the Current Medication list given to you today.  Your physician wants you to follow-up in: 6 months. You will receive a reminder letter in the mail two months in advance. If you don't receive a letter, please call our office to schedule the follow-up appointment.  

## 2013-08-15 NOTE — Progress Notes (Signed)
HPI  Patient is seen to follow up her cardiac status. She has severe pulmonary hypertension with severe lung disease. She's been admitted to the hospital on multiple occasions. She has of component of volume overload and do 2 her right heart failure. With significant right ventricular dysfunction, CT scan was done to rule out pulmonary emboli. There were no obvious pulmonary emboli. There is severe emphysema. Her most recent echo reveals an EF of 65%. There is significant right ventricular dysfunction and severe pulmonary hypertension.  Allergies  Allergen Reactions  . Ciprofloxacin     REACTION: nausea  . Latex     REACTION: rash, burning  . Simvastatin Other (See Comments)    Severe leg pain and burning  . Sulfamethoxazole-Trimethoprim     Bactrim REACTION: Projectile Vomiting  . Azithromycin Other (See Comments)    REACTION: resistant  . Doxycycline Other (See Comments)    REACTION: restistant  . Metformin And Related Diarrhea    Severe diarrhea  . Metronidazole Nausea And Vomiting  . Zolpidem Tartrate     Chest pain    Current Outpatient Prescriptions  Medication Sig Dispense Refill  . albuterol (PROVENTIL HFA;VENTOLIN HFA) 108 (90 BASE) MCG/ACT inhaler Inhale 2 puffs into the lungs every 6 (six) hours as needed for wheezing or shortness of breath.      Marland Kitchen albuterol (PROVENTIL) (2.5 MG/3ML) 0.083% nebulizer solution Take 2.5 mg by nebulization every 6 (six) hours as needed for wheezing.      Marland Kitchen ALPRAZolam (XANAX) 1 MG tablet Take 1 mg by mouth 2 (two) times daily as needed for anxiety.      Marland Kitchen aspirin EC 81 MG tablet Take 1 tablet (81 mg total) by mouth daily.  150 tablet  2  . B-D ULTRAFINE III SHORT PEN 31G X 8 MM MISC USE TO ADMINISTER INSULIN IN SOLOSTAR PEN  100 each  5  . budesonide-formoterol (SYMBICORT) 160-4.5 MCG/ACT inhaler Inhale 2 puffs into the lungs 2 (two) times daily.  1 Inhaler  2  . cetirizine (ZYRTEC) 1 MG/ML syrup TAKE 2 TEASPOONFUL BY MOUTH AT BEDTIME  300  mL  0  . Dextromethorphan-Guaifenesin (MUCINEX DM MAXIMUM STRENGTH) 60-1200 MG TB12 Take 1 tablet by mouth every 12 (twelve) hours.  14 each  0  . esomeprazole (NEXIUM) 40 MG capsule Take 1 capsule (40 mg total) by mouth 2 (two) times daily.  60 capsule  5  . fluticasone (FLONASE) 50 MCG/ACT nasal spray Place 2 sprays into both nostrils daily as needed for rhinitis.  16 g  2  . glimepiride (AMARYL) 1 MG tablet Take 1 mg by mouth daily before breakfast.      . insulin aspart (NOVOLOG) 100 UNIT/ML injection Before each meal 3 times a day, 140-199 - 2 units, 200-250 - 4 units, 251-299 - 6 units,  300-349 - 8 units,  350 or above 10 units. Insulin PEN if approved, provide syringes and needles if needed.  1 vial  12  . insulin glargine (LANTUS) 100 UNIT/ML injection Inject 20 Units into the skin See admin instructions. Takes 20 units every evening. Also takes 20 units in the morning only if sugar is over 200      . ONE TOUCH ULTRA TEST test strip USE AS DIRECTED  100 each  5  . Oxycodone HCl 10 MG TABS Take 1 tablet (10 mg total) by mouth 3 (three) times daily as needed (pain).  90 tablet  0  . potassium chloride SA (K-DUR,KLOR-CON) 20  MEQ tablet Take 1 tablet (20 mEq total) by mouth daily.  30 tablet  3  . predniSONE (DELTASONE) 20 MG tablet Take 2 tablets (40 mg total) by mouth daily with breakfast.  14 tablet  0  . sertraline (ZOLOFT) 50 MG tablet Take 1 tablet (50 mg total) by mouth daily.  30 tablet  3  . spironolactone (ALDACTONE) 25 MG tablet Take 1 tablet (25 mg total) by mouth 2 (two) times daily.  60 tablet  5  . tiotropium (SPIRIVA) 18 MCG inhalation capsule Place 18 mcg into inhaler and inhale daily.      Marland Kitchen tiZANidine (ZANAFLEX) 4 MG tablet TAKE 1 TABLET BY MOUTH THREE TIMES DAILY AS NEEDED FOR MUSCLE SPASMS  90 tablet  0  . torsemide (DEMADEX) 100 MG tablet Take 100 mg by mouth 2 (two) times daily.       No current facility-administered medications for this visit.    History   Social  History  . Marital Status: Legally Separated    Spouse Name: N/A    Number of Children: N/A  . Years of Education: N/A   Occupational History  . Not on file.   Social History Main Topics  . Smoking status: Former Smoker -- 0.25 packs/day for 29 years    Types: Cigarettes    Quit date: 06/24/2013  . Smokeless tobacco: Never Used     Comment: Resumed smoking September 2013 --2-3 CIGS DAILY AND E-CIG  . Alcohol Use: Yes     Comment: occasionally  . Drug Use: Yes    Special: Cocaine, Marijuana  . Sexual Activity: Not on file   Other Topics Concern  . Not on file   Social History Narrative   Pt is single. No children, and disaability    Family History  Problem Relation Age of Onset  . Hypertension Mother   . Emphysema Father   . Hypertension Father   . Stomach cancer Father   . Allergies Brother   . Hypertension Brother   . Stomach cancer Brother   . Heart disease Mother   . Heart disease Father   . Heart disease Brother     died age 97 sudden death/MI    Past Medical History  Diagnosis Date  . Sleep apnea     noncompliant w/ CPAP  . Hypertension   . Hyperlipidemia   . Chronic headache   . Fibromyalgia     daily narcotics  . Anxiety     hx chronic BZ use, stopped 07/2010  . Anemia   . Pulmonary sarcoidosis     unimpressive CT chest 2011  . Colonic polyp   . GERD (gastroesophageal reflux disease)   . ALLERGIC RHINITIS   . Asthma   . CHF (congestive heart failure)     Diastolic with fluid overload, May, 2012, LVEF 60%  . Morbid obesity   . Depression   . Panic attacks   . Diabetes mellitus   . COPD (chronic obstructive pulmonary disease)     on home O2, moderate airflow obstruction, suspect d/t emphysema  . Obesity   . Elevated LFTs 09/2011  . Ovarian cyst   . Chronic back pain   . Shortness of breath     Past Surgical History  Procedure Laterality Date  . Polypectomy  2011  . Lumbar microdiscectomy  07/06/2011    R L4-5, stern  . Back surgery     . Carpal tunnel release    . Steroid spinal injections    .  Hysteroscopy w/d&c N/A 03/25/2013    Procedure: DILATATION AND CURETTAGE /HYSTEROSCOPY;  Surgeon: Lavina Hamman, MD;  Location: [facility];  Service: Gynecology;  Laterality: N/A;  . Dilation and curettage of uterus      Patient Active Problem List   Diagnosis Date Noted  . Acute-on-chronic respiratory failure 08/03/2013  . Epigastric pain 07/17/2013  . Atypical chest pain 07/17/2013  . COPD with acute exacerbation 06/24/2013  . Depression   . Abnormal uterine bleeding 03/25/2013  . Chest tightness   . Palpitation   . Tobacco abuse 09/01/2012  . Knee pain, right 09/01/2012  . Low back pain 06/20/2012  . Diabetes type 2, uncontrolled 12/16/2011  . Obesity hypoventilation syndrome 04/14/2011  . Ejection fraction   . Sciatica of right side 02/17/2011  . OBSTRUCTIVE SLEEP APNEA 12/07/2010  . Morbid obesity 08/26/2010  . Acute on chronic diastolic heart failure 08/26/2010  . ALLERGIC RHINITIS 08/26/2010  . GERD 08/26/2010  . AMENORRHEA 08/26/2010  . FIBROMYALGIA 08/26/2010  . Chronic respiratory failure 08/17/2010  . PULMONARY SARCOIDOSIS 04/26/2010  . HYPERLIPIDEMIA 04/26/2010  . ANEMIA 04/26/2010  . ANXIETY 04/26/2010  . HYPERTENSION 04/26/2010  . COPD 04/26/2010  . HEADACHE, CHRONIC 04/26/2010    ROS   Patient denies fever, chills, headache, sweats, rash, change in vision, change in hearing, chest pain, cough, nausea vomiting, urinary symptoms. All other systems are reviewed and are negative.  PHYSICAL EXAM  Patient is significantly overweight. She is wearing home oxygen. Lungs reveal scattered rhonchi. Cardiac exam reveals S1 and S2. Abdomen is soft. There is only trace peripheral edema.  Filed Vitals:   08/15/13 1453  BP: 124/82  Pulse: 79  Weight: 297 lb (134.718 kg)  SpO2: 98%    ASSESSMENT & PLAN

## 2013-08-15 NOTE — Assessment & Plan Note (Signed)
The patient has excellent left ventricular function. She has decreased right ventricular function. She has elevated BNP because of the right ventricular strain. She needs to be on diuretics to help maintain her volume status. No further cardiac workup can be helpful here. Careful attention has to be paid to her volume status.

## 2013-08-18 ENCOUNTER — Ambulatory Visit (INDEPENDENT_AMBULATORY_CARE_PROVIDER_SITE_OTHER): Payer: PRIVATE HEALTH INSURANCE | Admitting: Internal Medicine

## 2013-08-18 ENCOUNTER — Encounter: Payer: Self-pay | Admitting: Internal Medicine

## 2013-08-18 VITALS — BP 112/84 | HR 77 | Temp 97.0°F | Ht 68.0 in | Wt 301.0 lb

## 2013-08-18 DIAGNOSIS — I5033 Acute on chronic diastolic (congestive) heart failure: Secondary | ICD-10-CM

## 2013-08-18 DIAGNOSIS — E1165 Type 2 diabetes mellitus with hyperglycemia: Secondary | ICD-10-CM

## 2013-08-18 DIAGNOSIS — J9620 Acute and chronic respiratory failure, unspecified whether with hypoxia or hypercapnia: Secondary | ICD-10-CM

## 2013-08-18 DIAGNOSIS — F411 Generalized anxiety disorder: Secondary | ICD-10-CM

## 2013-08-18 MED ORDER — METOLAZONE 2.5 MG PO TABS: 2.5000 mg | ORAL_TABLET | ORAL | Status: DC

## 2013-08-18 NOTE — Assessment & Plan Note (Signed)
freq exacerbations - acute exac with hosp 06/2013 for COPD flare and again 07/2013 Chronic high dose diuretic - 100mg  demadex bid Add zaroxlyn 3x/week now and encouraged med compliance and Na restriction Also CPAP compliance and O2 continuous - see next Continue monitor sodium intake and monitor weights at home - call if increase weight >3#/day Encouragement provided on continued weight loss efforts

## 2013-08-18 NOTE — Patient Instructions (Signed)
It was good to see you today.  We have reviewed your hospital records including labs and tests today  Medications reviewed and updated - add Zaroxyln 3x/week to ongoing medications for fluid and heart - Your prescription(s) have been submitted to your pharmacy. Please take as directed and contact our office if you believe you are having problem(s) with the medication(s).  Schedule followup here in 6 weeks to review diabetes mellitus, depression, weight, fluid, breathing and other medical issues; call sooner if problems  Keep followup with Dr. Shelle Iron and your other specialists as planned  Congratulations on being done smoking! Do not start smoking again!!!

## 2013-08-18 NOTE — Assessment & Plan Note (Signed)
Chronic BZ - remotely klonopin but ongoing scheduled xanax Also overlap with depression symptoms Exacerbated by resp faillure and air hunger Also exacerbated by freq/high dose steroids rx'd sertraline 08/2011 - continue same -  The current medical regimen is effective;  continue present plan and medications.

## 2013-08-18 NOTE — Assessment & Plan Note (Signed)
New dx 11/2011 - exacerbated by frequent pred use Intol of metformin due to severe diarrhea side effects  On Lantus pen qhs and Amaryl in AM - titrate as needed recheck a1c q 3-6 mo  Lab Results  Component Value Date   HGBA1C 6.2* 06/24/2013

## 2013-08-18 NOTE — Progress Notes (Signed)
Pre visit review using our clinic review tool, if applicable. No additional management support is needed unless otherwise documented below in the visit note. 

## 2013-08-18 NOTE — Progress Notes (Signed)
Subjective:    [name], female    DOB: 1966-03-24, 47 y.o.   MRN: [id number]  HPI  Patient here today for hospital follow up   Hospital Course: 1. Acute on chronic hypoxemic respiratory failure. I suspect secondary to chronic obstructive pulmonary disease exacerbation which could have been precipitated by underlying infectious process.CT scan of lungs with IV contrast performed yesterday negative for pulmonary embolism. Patient remaining afebrile, will transition to oral augmentin therapy. She has multiple antibiotic drug allergies listed. Pt is ambulating much better today and reports that she feels like she could go home.  2. Chronic obstructive pulmonary disease exacerbation. Continue prednisone taper, nebs at home, supplemental oxygen 4L/min 3. Chronic diastolic congestive heart failure. Repeat echo showing EF of 65-70% with diastolic dysfunction. Continue diuretic therapy. 4. Right-sided heart failure. Transthoracic echocardiogram showed RV volume and pressure overload with RV cavity size moderately dilated. I thinkthis is likely secondary to obesity hypoventilation syndrome/OSA, pulmonary hypertension and COPD. CT scan of lungs with IV contrast negative for pulmonary embolism.. 5. Type 2 diabetes mellitus. Steroids likely causing rise in blood sugars and insulin requirement. Blood sugars better controlled this morning as she required to be placed on IV insulin overnight. Will restart Lantus at 30 units subcutaneous twice a day, provide sliding scale coverage, continue Accu-Cheks.  6. DVT prophylaxis. Heparin 5000 units subcutaneous 3 times a day used in hospital.   Patient reports 4 pound weight gain since DC Friday.  She reports compliance with current diuretic therapy.  Also with increased shortness of breath and agitation.  Insomnia resulting in decreased CPAP use since discharge.   Chronic medical issues also reviewed -  Type II diabetes - Currently on Amaryl,  Lantus, and SSI. Pt reports compliance with current therapy. Reports fasting blood sugars running 300-400. Previously intolerant of Metformin 2/2 diarrhea. HBG A1C 05/2013 6.2. Pt making efforts to improve diet -  COPD/severe pulmonary hypertension - followed by pulmonary ([facility]). Pt quit smoking 05/2013.  Will soon finish prednisone taper after hospitalization 07/2013.   Right sided heart failure - follows with cardiology [facility]) - continued diuretic therapy to maintain volume status.   Past Medical History  Diagnosis Date  . Sleep apnea     noncompliant w/ CPAP  . Hypertension   . Hyperlipidemia   . Chronic headache   . Fibromyalgia     daily narcotics  . Anxiety     hx chronic BZ use, stopped 07/2010  . Anemia   . Pulmonary sarcoidosis     unimpressive CT chest 2011  . Colonic polyp   . GERD (gastroesophageal reflux disease)   . ALLERGIC RHINITIS   . Asthma   . CHF (congestive heart failure)     Diastolic with fluid overload, May, 2012, LVEF 60%  . Morbid obesity   . Depression   . Panic attacks   . Diabetes mellitus   . COPD (chronic obstructive pulmonary disease)     on home O2, moderate airflow obstruction, suspect d/t emphysema  . Obesity   . Elevated LFTs 09/2011  . Ovarian cyst   . Chronic back pain   . Shortness of breath      Review of Systems  Constitutional: Negative for fever, chills, activity change and appetite change.  HENT: Positive for congestion. Negative for rhinorrhea and sinus pressure.   Respiratory: Positive for shortness of breath. Negative for cough and wheezing.   Cardiovascular: Negative for chest pain, palpitations and leg swelling.  Gastrointestinal: Negative for  nausea, vomiting, diarrhea and constipation.  Endocrine: Positive for polydipsia.  Neurological: Negative for dizziness, seizures, syncope and headaches.  Psychiatric/Behavioral: The patient is nervous/anxious.        Objective:   Physical Exam  Constitutional: She is oriented  to person, place, and time.  Neck: Normal range of motion. Neck supple. No thyromegaly present.  Cardiovascular: Normal rate, regular rhythm and normal heart sounds.   No murmur heard. Pulmonary/Chest: Effort normal.  Decreased breath sounds  Musculoskeletal: Normal range of motion.  Lymphadenopathy:    She has no cervical adenopathy.  Neurological: She is alert and oriented to person, place, and time.  Skin: Skin is warm and dry.  Psychiatric: She has a normal mood and affect. Her behavior is normal. Judgment and thought content normal.    Wt Readings from Last 3 Encounters:  08/18/13 301 lb (136.533 kg)  08/15/13 297 lb (134.718 kg)  08/07/13 300 lb 6.4 oz (136.261 kg)   BP Readings from Last 3 Encounters:  08/18/13 112/84  08/15/13 124/82  08/07/13 124/70   Lab Results  Component Value Date   WBC 12.8* 08/06/2013   HGB 12.5 08/06/2013   HCT 38.3 08/06/2013   PLT 187 08/06/2013   GLUCOSE 177* 08/06/2013   CHOL 255* 02/13/2012   TRIG 133.0 02/13/2012   HDL 54.30 02/13/2012   LDLDIRECT 186.4 02/13/2012   LDLCALC  Value: 92        Total Cholesterol/HDL:CHD Risk Coronary Heart Disease Risk Table                     Men   Women  1/2 Average Risk   3.4   3.3  Average Risk       5.0   4.4  2 X Average Risk   9.6   7.1  3 X Average Risk  23.4   11.0        Use the calculated Patient Ratio above and the CHD Risk Table to determine the patient's CHD Risk.        ATP III CLASSIFICATION (LDL):  <100     mg/dL   Optimal  161-096  mg/dL   Near or Above                    Optimal  130-159  mg/dL   Borderline  045-409  mg/dL   High  >811     mg/dL   Very High 06/08/7828   ALT 54* 08/03/2013   AST 29 08/03/2013   NA 133* 08/06/2013   K 3.9 08/06/2013   CL 91* 08/06/2013   CREATININE 1.05 08/06/2013   BUN 25* 08/06/2013   CO2 29 08/06/2013   TSH 0.701 04/22/2012   INR 0.96 08/03/2013   HGBA1C 6.2* 06/24/2013       Assessment & Plan:   See problem list-  Time spent with pt today 25  minutes, greater than 50% time spent counseling patient on CHF, a/c resp failure ,DM2  and medication review. Also review of hospital records

## 2013-08-18 NOTE — Assessment & Plan Note (Signed)
Acute exacerbation with COPD 06/2013 and 07/2013 - hospitalization for same reviewed A/C dCHF - see above Chronic hypoxic resp failure with COPD - improving with pred taper continue bipap at home, now on nasal mask since 10/14 hosp, but noncompliance with same review baseline hypercarbia reviewed ongoing chronic inhalers (spiriva and symbicort) with prn nebulizer continue home oxygen and followup with pulmonary specialist as planned Commended pt on cessation of smoking and encouraged never to smoke again!

## 2013-08-25 ENCOUNTER — Encounter (HOSPITAL_COMMUNITY): Payer: Self-pay | Admitting: Emergency Medicine

## 2013-08-25 ENCOUNTER — Telehealth: Payer: Self-pay | Admitting: Internal Medicine

## 2013-08-25 ENCOUNTER — Emergency Department (HOSPITAL_COMMUNITY)
Admission: EM | Admit: 2013-08-25 | Discharge: 2013-08-25 | Disposition: A | Discharge status: Home / Self Care | Payer: PRIVATE HEALTH INSURANCE | Attending: Emergency Medicine | Admitting: Emergency Medicine

## 2013-08-25 ENCOUNTER — Emergency Department (HOSPITAL_COMMUNITY): Payer: PRIVATE HEALTH INSURANCE

## 2013-08-25 ENCOUNTER — Inpatient Hospital Stay: Payer: Self-pay | Admitting: Adult Health

## 2013-08-25 DIAGNOSIS — R000 Tachycardia, unspecified: Secondary | ICD-10-CM | POA: Insufficient documentation

## 2013-08-25 DIAGNOSIS — Z9981 Dependence on supplemental oxygen: Secondary | ICD-10-CM | POA: Insufficient documentation

## 2013-08-25 DIAGNOSIS — Z888 Allergy status to other drugs, medicaments and biological substances status: Secondary | ICD-10-CM | POA: Insufficient documentation

## 2013-08-25 DIAGNOSIS — Z7982 Long term (current) use of aspirin: Secondary | ICD-10-CM | POA: Insufficient documentation

## 2013-08-25 DIAGNOSIS — R0602 Shortness of breath: Secondary | ICD-10-CM | POA: Insufficient documentation

## 2013-08-25 DIAGNOSIS — J449 Chronic obstructive pulmonary disease, unspecified: Secondary | ICD-10-CM | POA: Insufficient documentation

## 2013-08-25 DIAGNOSIS — Z8742 Personal history of other diseases of the female genital tract: Secondary | ICD-10-CM | POA: Insufficient documentation

## 2013-08-25 DIAGNOSIS — Z794 Long term (current) use of insulin: Secondary | ICD-10-CM | POA: Insufficient documentation

## 2013-08-25 DIAGNOSIS — G8929 Other chronic pain: Secondary | ICD-10-CM | POA: Insufficient documentation

## 2013-08-25 DIAGNOSIS — E669 Obesity, unspecified: Secondary | ICD-10-CM | POA: Insufficient documentation

## 2013-08-25 DIAGNOSIS — F3289 Other specified depressive episodes: Secondary | ICD-10-CM | POA: Insufficient documentation

## 2013-08-25 DIAGNOSIS — Z860100 Personal history of colon polyps, unspecified: Secondary | ICD-10-CM | POA: Insufficient documentation

## 2013-08-25 DIAGNOSIS — D649 Anemia, unspecified: Secondary | ICD-10-CM | POA: Insufficient documentation

## 2013-08-25 DIAGNOSIS — I10 Essential (primary) hypertension: Secondary | ICD-10-CM | POA: Insufficient documentation

## 2013-08-25 DIAGNOSIS — J309 Allergic rhinitis, unspecified: Secondary | ICD-10-CM | POA: Insufficient documentation

## 2013-08-25 DIAGNOSIS — F410 Panic disorder [episodic paroxysmal anxiety] without agoraphobia: Secondary | ICD-10-CM | POA: Insufficient documentation

## 2013-08-25 DIAGNOSIS — J45909 Unspecified asthma, uncomplicated: Secondary | ICD-10-CM | POA: Insufficient documentation

## 2013-08-25 DIAGNOSIS — M549 Dorsalgia, unspecified: Secondary | ICD-10-CM | POA: Insufficient documentation

## 2013-08-25 DIAGNOSIS — E785 Hyperlipidemia, unspecified: Secondary | ICD-10-CM | POA: Insufficient documentation

## 2013-08-25 DIAGNOSIS — Z87891 Personal history of nicotine dependence: Secondary | ICD-10-CM | POA: Insufficient documentation

## 2013-08-25 DIAGNOSIS — Z881 Allergy status to other antibiotic agents status: Secondary | ICD-10-CM | POA: Insufficient documentation

## 2013-08-25 DIAGNOSIS — F329 Major depressive disorder, single episode, unspecified: Secondary | ICD-10-CM | POA: Insufficient documentation

## 2013-08-25 DIAGNOSIS — Z79899 Other long term (current) drug therapy: Secondary | ICD-10-CM | POA: Insufficient documentation

## 2013-08-25 DIAGNOSIS — J4489 Other specified chronic obstructive pulmonary disease: Secondary | ICD-10-CM | POA: Insufficient documentation

## 2013-08-25 DIAGNOSIS — G4730 Sleep apnea, unspecified: Secondary | ICD-10-CM | POA: Insufficient documentation

## 2013-08-25 DIAGNOSIS — IMO0001 Reserved for inherently not codable concepts without codable children: Secondary | ICD-10-CM | POA: Insufficient documentation

## 2013-08-25 DIAGNOSIS — Z8669 Personal history of other diseases of the nervous system and sense organs: Secondary | ICD-10-CM | POA: Insufficient documentation

## 2013-08-25 DIAGNOSIS — R0789 Other chest pain: Secondary | ICD-10-CM

## 2013-08-25 DIAGNOSIS — E119 Type 2 diabetes mellitus without complications: Secondary | ICD-10-CM | POA: Insufficient documentation

## 2013-08-25 DIAGNOSIS — Z91040 Latex allergy status: Secondary | ICD-10-CM | POA: Insufficient documentation

## 2013-08-25 DIAGNOSIS — K219 Gastro-esophageal reflux disease without esophagitis: Secondary | ICD-10-CM | POA: Insufficient documentation

## 2013-08-25 DIAGNOSIS — Z882 Allergy status to sulfonamides status: Secondary | ICD-10-CM | POA: Insufficient documentation

## 2013-08-25 DIAGNOSIS — R079 Chest pain, unspecified: Secondary | ICD-10-CM | POA: Insufficient documentation

## 2013-08-25 DIAGNOSIS — I509 Heart failure, unspecified: Secondary | ICD-10-CM | POA: Insufficient documentation

## 2013-08-25 DIAGNOSIS — F411 Generalized anxiety disorder: Secondary | ICD-10-CM | POA: Insufficient documentation

## 2013-08-25 LAB — COMPREHENSIVE METABOLIC PANEL
ALT: 35 U/L (ref 0–35)
Albumin: 3.8 g/dL (ref 3.5–5.2)
BUN: 18 mg/dL (ref 6–23)
Calcium: 10 mg/dL (ref 8.4–10.5)
Creatinine, Ser: 1.07 mg/dL (ref 0.50–1.10)
GFR calc Af Amer: 71 mL/min — ABNORMAL LOW (ref >90)
GFR calc non Af Amer: 61 mL/min — ABNORMAL LOW (ref >90)
Glucose, Bld: 159 mg/dL — ABNORMAL HIGH (ref 70–99)
Sodium: 133 mEq/L — ABNORMAL LOW (ref 135–145)
Total Protein: 7.6 g/dL (ref 6.0–8.3)

## 2013-08-25 LAB — POCT I-STAT TROPONIN I: Troponin i, poc: 0 ng/mL (ref 0.00–0.08)

## 2013-08-25 LAB — CBC WITH DIFFERENTIAL/PLATELET
Basophils Relative: 0 % (ref 0–1)
Eosinophils Absolute: 0.3 10*3/uL (ref 0.0–0.7)
Eosinophils Relative: 4 % (ref 0–5)
Hemoglobin: 15.2 g/dL — ABNORMAL HIGH (ref 12.0–15.0)
Lymphs Abs: 1.2 10*3/uL (ref 0.7–4.0)
MCH: 28.2 pg (ref 26.0–34.0)
MCHC: 34.4 g/dL (ref 30.0–36.0)
MCV: 82 fL (ref 78.0–100.0)
Monocytes Relative: 5 % (ref 3–12)
Neutrophils Relative %: 76 % (ref 43–77)
Platelets: 173 10*3/uL (ref 150–400)
RBC: 5.39 MIL/uL — ABNORMAL HIGH (ref 3.87–5.11)
RDW: 18.2 % — ABNORMAL HIGH (ref 11.5–15.5)

## 2013-08-25 LAB — PRO B NATRIURETIC PEPTIDE: Pro B Natriuretic peptide (BNP): 9.6 pg/mL (ref 0–125)

## 2013-08-25 LAB — GLUCOSE, CAPILLARY: Glucose-Capillary: 95 mg/dL (ref 70–99)

## 2013-08-25 MED ORDER — HYDROCODONE-ACETAMINOPHEN 5-325 MG PO TABS: 2.0000 | ORAL_TABLET | ORAL | Status: DC | PRN

## 2013-08-25 MED ORDER — POTASSIUM CHLORIDE CRYS ER 20 MEQ PO TBCR
40.0000 meq | EXTENDED_RELEASE_TABLET | Freq: Once | ORAL | Status: AC
  Administered 2013-08-25: 40 meq via ORAL
  Filled 2013-08-25: qty 2

## 2013-08-25 MED ORDER — MORPHINE SULFATE 4 MG/ML IJ SOLN
4.0000 mg | Freq: Once | INTRAMUSCULAR | Status: AC
  Administered 2013-08-25: 4 mg via INTRAVENOUS
  Filled 2013-08-25: qty 1

## 2013-08-25 MED ORDER — LEVOFLOXACIN 750 MG PO TABS: 750.0000 mg | ORAL_TABLET | Freq: Every day | ORAL | Status: DC

## 2013-08-25 MED ORDER — ONDANSETRON HCL 4 MG/2ML IJ SOLN
4.0000 mg | Freq: Once | INTRAMUSCULAR | Status: AC
  Administered 2013-08-25: 4 mg via INTRAVENOUS
  Filled 2013-08-25: qty 2

## 2013-08-25 NOTE — ED Provider Notes (Signed)
CSN: [id number]     Arrival date & time 08/25/13  1455 History   First MD Initiated Contact with Patient 08/25/13 1907     Chief Complaint  Patient presents with  . Chest Pain   (Consider location/radiation/quality/duration/timing/severity/associated sxs/prior Treatment) HPI Comments: Patient presents to ER for evaluation of difficulty breathing and chest pain. Patient reports that symptoms began last night, has continued today and are somewhat worse. She says she has had a sharp pain in the center of her chest it worsens when she takes a breath, bends, twists or presses on the area. She also has had intermittent sharp shocking pains that occur. She has felt more short of breath than usual. She is on oxygen at home, increase her oxygen today.  Patient is a 47 y.o. female presenting with chest pain.  Chest Pain Associated symptoms: shortness of breath     Past Medical History  Diagnosis Date  . Sleep apnea     noncompliant w/ CPAP  . Hypertension   . Hyperlipidemia   . Chronic headache   . Fibromyalgia     daily narcotics  . Anxiety     hx chronic BZ use, stopped 07/2010  . Anemia   . Pulmonary sarcoidosis     unimpressive CT chest 2011  . Colonic polyp   . GERD (gastroesophageal reflux disease)   . ALLERGIC RHINITIS   . Asthma   . CHF (congestive heart failure)     Diastolic with fluid overload, May, 2012, LVEF 60%  . Morbid obesity   . Depression   . Panic attacks   . Diabetes mellitus   . COPD (chronic obstructive pulmonary disease)     on home O2, moderate airflow obstruction, suspect d/t emphysema  . Obesity   . Elevated LFTs 09/2011  . Ovarian cyst   . Chronic back pain   . Shortness of breath    Past Surgical History  Procedure Laterality Date  . Polypectomy  2011  . Lumbar microdiscectomy  07/06/2011    R L4-5, stern  . Back surgery    . Carpal tunnel release    . Steroid spinal injections    . Hysteroscopy w/d&c N/A 03/25/2013    Procedure: DILATATION AND  CURETTAGE /HYSTEROSCOPY;  Surgeon: Lavina Hamman, MD;  Location: [facility];  Service: Gynecology;  Laterality: N/A;  . Dilation and curettage of uterus     Family History  Problem Relation Age of Onset  . Hypertension Mother   . Emphysema Father   . Hypertension Father   . Stomach cancer Father   . Allergies Brother   . Hypertension Brother   . Stomach cancer Brother   . Heart disease Mother   . Heart disease Father   . Heart disease Brother     died age 84 sudden death/MI   History  Substance Use Topics  . Smoking status: Former Smoker -- 0.25 packs/day for 29 years    Types: Cigarettes    Quit date: 06/24/2013  . Smokeless tobacco: Never Used     Comment: Resumed smoking September 2013 --2-3 CIGS DAILY AND E-CIG  . Alcohol Use: Yes     Comment: occasionally   OB History   Grav Para Term Preterm Abortions TAB SAB Ect Mult Living                 Review of Systems  Respiratory: Positive for shortness of breath.   Cardiovascular: Positive for chest pain.  All other systems reviewed and are negative.  Allergies  Simvastatin; Sulfamethoxazole-trimethoprim; Zolpidem tartrate; Azithromycin; Doxycycline; Latex; Metformin and related; Metronidazole; and Ciprofloxacin  Home Medications   Current Outpatient Rx  Name  Route  Sig  Dispense  Refill  . albuterol (PROVENTIL HFA;VENTOLIN HFA) 108 (90 BASE) MCG/ACT inhaler   Inhalation   Inhale 2 puffs into the lungs every 6 (six) hours as needed for wheezing or shortness of breath.         Marland Kitchen albuterol (PROVENTIL) (2.5 MG/3ML) 0.083% nebulizer solution   Nebulization   Take 2.5 mg by nebulization every 6 (six) hours as needed for wheezing.         Marland Kitchen ALPRAZolam (XANAX) 1 MG tablet   Oral   Take 1 mg by mouth 2 (two) times daily as needed for anxiety.         Marland Kitchen aspirin EC 81 MG tablet   Oral   Take 1 tablet (81 mg total) by mouth daily.   150 tablet   2   . budesonide-formoterol (SYMBICORT) 160-4.5 MCG/ACT inhaler    Inhalation   Inhale 2 puffs into the lungs 2 (two) times daily.   1 Inhaler   2   . cetirizine (ZYRTEC) 1 MG/ML syrup   Oral   Take 10 mg by mouth at bedtime.         Marland Kitchen esomeprazole (NEXIUM) 40 MG capsule   Oral   Take 1 capsule (40 mg total) by mouth 2 (two) times daily.   60 capsule   5   . fluticasone (FLONASE) 50 MCG/ACT nasal spray   Each Nare   Place 2 sprays into both nostrils daily as needed for rhinitis.   16 g   2   . glimepiride (AMARYL) 1 MG tablet   Oral   Take 1 mg by mouth daily before breakfast.         . insulin aspart (NOVOLOG) 100 UNIT/ML injection   Subcutaneous   Inject 0-10 Units into the skin 3 (three) times daily before meals. CBG less than 140: 0 units 141-200: 2 units 201-250: 4 units 251-300: 6 units 301-350: 8 units 351-400: 10 units 400+: call MD         . insulin glargine (LANTUS) 100 UNIT/ML injection   Subcutaneous   Inject 20-40 Units into the skin See admin instructions. Takes 20 units every evening. Also takes 20 units in the morning only if sugar is over 200         . metolazone (ZAROXOLYN) 2.5 MG tablet   Oral   Take 1 tablet (2.5 mg total) by mouth 3 (three) times a week.   30 tablet   0   . Oxycodone HCl 10 MG TABS   Oral   Take 1 tablet (10 mg total) by mouth 3 (three) times daily as needed (pain).   90 tablet   0   . potassium chloride SA (K-DUR,KLOR-CON) 20 MEQ tablet   Oral   Take 1 tablet (20 mEq total) by mouth daily.   30 tablet   3   . sertraline (ZOLOFT) 50 MG tablet   Oral   Take 1 tablet (50 mg total) by mouth daily.   30 tablet   3   . spironolactone (ALDACTONE) 25 MG tablet   Oral   Take 1 tablet (25 mg total) by mouth 2 (two) times daily.   60 tablet   5   . tiotropium (SPIRIVA) 18 MCG inhalation capsule   Inhalation   Place 18 mcg into inhaler and  inhale daily.         Marland Kitchen tiZANidine (ZANAFLEX) 4 MG tablet   Oral   Take 4 mg by mouth every 8 (eight) hours as needed for muscle  spasms.         Marland Kitchen torsemide (DEMADEX) 100 MG tablet   Oral   Take 100 mg by mouth 2 (two) times daily.          BP 117/65  Pulse 104  Temp(Src) 97.6 F (36.4 C) (Oral)  Resp 22  SpO2 96%  LMP 10/16/2012 Physical Exam  Constitutional: She is oriented to person, place, and time. She appears well-developed and well-nourished. No distress.  HENT:  Head: Normocephalic and atraumatic.  Right Ear: Hearing normal.  Left Ear: Hearing normal.  Nose: Nose normal.  Mouth/Throat: Oropharynx is clear and moist and mucous membranes are normal.  Eyes: Conjunctivae and EOM are normal. Pupils are equal, round, and reactive to light.  Neck: Normal range of motion. Neck supple.  Cardiovascular: Regular rhythm, S1 normal and S2 normal.  Exam reveals no gallop and no friction rub.   No murmur heard. Pulmonary/Chest: Effort normal and breath sounds normal. No respiratory distress. She exhibits tenderness.    Abdominal: Soft. Normal appearance and bowel sounds are normal. There is no hepatosplenomegaly. There is no tenderness. There is no rebound, no guarding, no tenderness at McBurney's point and negative Murphy's sign. No hernia.  Musculoskeletal: Normal range of motion.  Neurological: She is alert and oriented to person, place, and time. She has normal strength. No cranial nerve deficit or sensory deficit. Coordination normal. GCS eye subscore is 4. GCS verbal subscore is 5. GCS motor subscore is 6.  Skin: Skin is warm, dry and intact. No rash noted. No cyanosis.  Psychiatric: She has a normal mood and affect. Her speech is normal and behavior is normal. Thought content normal.    ED Course  Procedures (including critical care time) Labs Review Labs Reviewed  CBC WITH DIFFERENTIAL - Abnormal; Notable for the following:    RBC 5.39 (*)    Hemoglobin 15.2 (*)    RDW 18.2 (*)    All other components within normal limits  COMPREHENSIVE METABOLIC PANEL - Abnormal; Notable for the following:      Sodium 133 (*)    Potassium 3.0 (*)    Chloride 88 (*)    Glucose, Bld 159 (*)    GFR calc non Af Amer 61 (*)    GFR calc Af Amer 71 (*)    All other components within normal limits  PRO B NATRIURETIC PEPTIDE  GLUCOSE, CAPILLARY  POCT I-STAT TROPONIN I   Imaging Review Dg Chest 2 View  08/25/2013   CLINICAL DATA:  Chest pain.  EXAM: CHEST  2 VIEW  COMPARISON:  Chest x-ray 07/16/2013.  FINDINGS: The cardiac silhouette, mediastinal and hilar contours are stable. There are chronic lung changes with emphysema and pulmonary scarring. No infiltrates, edema or effusions. The bony thorax is intact.  IMPRESSION: Emphysematous changes and pulmonary scarring but no acute pulmonary findings.   Electronically Signed   By: Loralie Champagne M.D.   On: 08/25/2013 17:10    EKG Interpretation    Date/Time:  Monday August 25 2013 15:02:27 EST Ventricular Rate:  104 PR Interval:  170 QRS Duration: 82 QT Interval:  330 QTC Calculation: 433 R Axis:   37 Text Interpretation:  Sinus tachycardia Right atrial enlargement Borderline ECG No significant change since last tracing Confirmed by POLLINA  MD, CHRISTOPHER (  [id number]) on 08/25/2013 7:16:15 PM            MDM  Diagnosis: 1. chest wall pain 2. Hypokalemia  Patient presents to the ER for evaluation of chest wall type pain. She reports that the pain began yesterday and has been continuous since its onset. It is in the center of her chest it worsens when she takes a deep breath, however it also worsens with palpation and movement. The pain is very reproducible with palpation over the sternal region. Patient does have previous history of lung disease. She is on oxygen at home. Chest x-ray is clear, no signs of pneumonia, mass, pulmonary edema. Cardiac workup otherwise negative. Patient treated with analgesia, and reassured. She is to follow up with primary doctor in the office this week. Return if symptoms worsen.    Gilda Crease,  MD 08/25/13 2133

## 2013-08-25 NOTE — Telephone Encounter (Signed)
Patient Information:  Caller Name: Geana  Phone: [phone]  Patient: [name], [name]  Gender: Female  DOB: 10/27/1965  Age: 47 Years  PCP: Rene Paci (Adults only)  Pregnant: No  Office Follow Up:  Does the office need to follow up with this patient?: No  Instructions For The Office: N/A  RN Note:  Pt refuses 911 however does agree to ED dispo.  Pt indicated that she would go to [facility].  Symptoms  Reason For Call & Symptoms: Chest Pain present.  Started last PM.  This am pt states that she has reflux.  Pt descibes pain as a stabbing pain in center of chest.  No pressure.  The pain comes and goes.  When pain returns it is the same.  Pressing on chest makes pain worse.  No SHOB, Not sweating.  CHF-no weight gain.  ANkles and legs are not swollen.  DM FSBS 157 this am.  Pt is currently on 4lmp.  Pt states that normal is 3lpm and she increased it because she was 'struggling to breath'  Reviewed Health History In EMR: Yes  Reviewed Medications In EMR: Yes  Reviewed Allergies In EMR: Yes  Reviewed Surgeries / Procedures: Yes  Date of Onset of Symptoms: 08/25/2013 OB / GYN:  LMP: Unknown  Guideline(s) Used:  Chest Pain  Disposition Per Guideline:   Call EMS 911 Now  Reason For Disposition Reached:   Chest pain lasting longer than 5 minutes and ANY of the following:  Over 38 years old Over 81 years old and at least one cardiac risk factor (i.e., high blood pressure, diabetes, high cholesterol, obesity, smoker or strong family history of heart disease) Pain is crushing, pressure-like, or heavy  Took nitroglycerin and chest pain was not relieved History of heart disease (i.e., angina, heart attack, bypass surgery, angioplasty, CHF)  Advice Given:  N/A  Patient Will Follow Care Advice:  YES

## 2013-08-25 NOTE — ED Notes (Signed)
Pt states started having pain with deep breath last nite and continued through the nite.  Pt states that she started having intermittent shocking pains.  Pt states she turned home 02 up to 4L

## 2013-08-25 NOTE — ED Notes (Signed)
Per Dr. Blinda Leatherwood, disregard PNA/Flu screening.

## 2013-08-27 LAB — HM DIABETES FOOT EXAM

## 2013-08-28 ENCOUNTER — Other Ambulatory Visit: Payer: Self-pay | Admitting: Internal Medicine

## 2013-08-29 ENCOUNTER — Ambulatory Visit: Payer: Self-pay | Admitting: Pulmonary Disease

## 2013-08-29 ENCOUNTER — Ambulatory Visit: Payer: Self-pay | Admitting: Internal Medicine

## 2013-08-29 ENCOUNTER — Other Ambulatory Visit: Payer: Self-pay | Admitting: Internal Medicine

## 2013-09-02 ENCOUNTER — Telehealth: Payer: Self-pay | Admitting: Internal Medicine

## 2013-09-02 MED ORDER — OXYCODONE HCL 10 MG PO TABS: 10.0000 mg | ORAL_TABLET | Freq: Three times a day (TID) | ORAL | Status: DC | PRN

## 2013-09-03 ENCOUNTER — Encounter: Payer: Self-pay | Admitting: Internal Medicine

## 2013-09-03 ENCOUNTER — Ambulatory Visit (INDEPENDENT_AMBULATORY_CARE_PROVIDER_SITE_OTHER): Payer: PRIVATE HEALTH INSURANCE | Admitting: Internal Medicine

## 2013-09-03 ENCOUNTER — Ambulatory Visit (INDEPENDENT_AMBULATORY_CARE_PROVIDER_SITE_OTHER): Payer: PRIVATE HEALTH INSURANCE

## 2013-09-03 VITALS — BP 118/72 | HR 118 | Temp 97.8°F | Wt 301.2 lb

## 2013-09-03 DIAGNOSIS — E876 Hypokalemia: Secondary | ICD-10-CM

## 2013-09-03 DIAGNOSIS — F341 Dysthymic disorder: Secondary | ICD-10-CM

## 2013-09-03 DIAGNOSIS — F418 Other specified anxiety disorders: Secondary | ICD-10-CM

## 2013-09-03 DIAGNOSIS — IMO0001 Reserved for inherently not codable concepts without codable children: Secondary | ICD-10-CM

## 2013-09-03 DIAGNOSIS — E1149 Type 2 diabetes mellitus with other diabetic neurological complication: Secondary | ICD-10-CM

## 2013-09-03 DIAGNOSIS — I509 Heart failure, unspecified: Secondary | ICD-10-CM

## 2013-09-03 DIAGNOSIS — I5032 Chronic diastolic (congestive) heart failure: Secondary | ICD-10-CM

## 2013-09-03 DIAGNOSIS — E1165 Type 2 diabetes mellitus with hyperglycemia: Secondary | ICD-10-CM

## 2013-09-03 DIAGNOSIS — I5033 Acute on chronic diastolic (congestive) heart failure: Secondary | ICD-10-CM

## 2013-09-03 LAB — LIPID PANEL
Cholesterol: 304 mg/dL — ABNORMAL HIGH (ref 0–200)
HDL: 49.5 mg/dL (ref >39.00)
Triglycerides: 340 mg/dL — ABNORMAL HIGH (ref 0.0–149.0)

## 2013-09-03 LAB — HEMOGLOBIN A1C: Hgb A1c MFr Bld: 7.2 % — ABNORMAL HIGH (ref 4.6–6.5)

## 2013-09-03 LAB — BASIC METABOLIC PANEL
CO2: 36 mEq/L — ABNORMAL HIGH (ref 19–32)
Calcium: 9.8 mg/dL (ref 8.4–10.5)
Chloride: 83 mEq/L — ABNORMAL LOW (ref 96–112)
Glucose, Bld: 199 mg/dL — ABNORMAL HIGH (ref 70–99)
Potassium: 3.4 mEq/L — ABNORMAL LOW (ref 3.5–5.1)
Sodium: 132 mEq/L — ABNORMAL LOW (ref 135–145)

## 2013-09-03 MED ORDER — RANITIDINE HCL 150 MG PO TABS: 150.0000 mg | ORAL_TABLET | Freq: Two times a day (BID) | ORAL | Status: DC

## 2013-09-03 MED ORDER — FLUCONAZOLE 150 MG PO TABS: 150.0000 mg | ORAL_TABLET | Freq: Once | ORAL | Status: DC

## 2013-09-03 MED ORDER — CLONAZEPAM 1 MG PO TABS: 0.5000 mg | ORAL_TABLET | Freq: Two times a day (BID) | ORAL | Status: DC | PRN

## 2013-09-03 MED ORDER — CLONAZEPAM 1 MG PO TABS: 1.0000 mg | ORAL_TABLET | Freq: Two times a day (BID) | ORAL | Status: DC | PRN

## 2013-09-03 MED ORDER — LIRAGLUTIDE 18 MG/3ML ~~LOC~~ SOPN: 0.6000 mg | PEN_INJECTOR | Freq: Every day | SUBCUTANEOUS | Status: DC

## 2013-09-03 MED ORDER — CALCIUM CARBONATE ANTACID 500 MG PO CHEW: 1.0000 | CHEWABLE_TABLET | Freq: Three times a day (TID) | ORAL | Status: DC

## 2013-09-03 MED ORDER — SERTRALINE HCL 100 MG PO TABS: 100.0000 mg | ORAL_TABLET | Freq: Every day | ORAL | Status: DC

## 2013-09-03 NOTE — Assessment & Plan Note (Signed)
New dx 11/2011 - exacerbated by frequent pred use Intol of metformin due to severe diarrhea side effects  On Lantus pen qhs and Amaryl in AM - titrate as needed Add low dose Victoza now - may assist with patient's weight loss goals for controlling appetite recheck a1c q 3-6 mo  Lab Results  Component Value Date   HGBA1C 6.2* 06/24/2013

## 2013-09-03 NOTE — Patient Instructions (Signed)
It was good to see you today.  We have reviewed your hospital records including labs and tests today  Medications reviewed and updated -   Add Victoza injection once Daily for diabetes in addition to ongoing medications  Increase Zoloft to 100 mg daily stop Xanax and use Klonopin as needed for nerves  Your prescription(s) have been submitted to your pharmacy. Please take as directed and contact our office if you believe you are having problem(s) with the medication(s).  Test(s) ordered today. Your results will be released to MyChart (or called to you) after review, usually within 72hours after test completion. If any changes need to be made, you will be notified at that same time.  Schedule followup here in 6 weeks to review diabetes mellitus, depression, weight, fluid, breathing and other medical issues; call sooner if problems  Keep followup with Dr. Shelle Iron and your other specialists as planned  Congratulations on being done smoking! Do not start smoking again!!!

## 2013-09-03 NOTE — Progress Notes (Signed)
Subjective:    [name], female    DOB: [birth year], 47 y.o.   MRN: [id number]  HPI  Patient here today for  ER follow up - seen 12/1 for DOE  Chronic medical issues also reviewed -  Type II diabetes - Currently on Amaryl, Lantus, and SSI. Pt reports compliance with current therapy. Reports fasting blood sugars running 300-400. Previously intolerant of Metformin 2/2 diarrhea. HBG A1C 05/2013 6.2. Pt making efforts to improve diet -  COPD/severe pulmonary hypertension - followed by pulmonary ([facility]). Pt quit smoking 05/2013.  Will soon finish prednisone taper after hospitalization 07/2013.   Right sided heart failure - follows with cardiology [facility]) - continued diuretic therapy to maintain volume status.   Past Medical History  Diagnosis Date  . Sleep apnea     noncompliant w/ CPAP  . Hypertension   . Hyperlipidemia   . Chronic headache   . Fibromyalgia     daily narcotics  . Anxiety     hx chronic BZ use, stopped 07/2010  . Anemia   . Pulmonary sarcoidosis     unimpressive CT chest 2011  . Colonic polyp   . GERD (gastroesophageal reflux disease)   . ALLERGIC RHINITIS   . Asthma   . CHF (congestive heart failure)     Diastolic with fluid overload, May, 2012, LVEF 60%  . Morbid obesity   . Depression   . Panic attacks   . Diabetes mellitus   . COPD (chronic obstructive pulmonary disease)     on home O2, moderate airflow obstruction, suspect d/t emphysema  . Obesity   . Elevated LFTs 09/2011  . Ovarian cyst   . Chronic back pain   . Shortness of breath      Review of Systems  Constitutional: Negative for fever, chills, activity change and appetite change.  HENT: Negative for congestion, rhinorrhea and sinus pressure.   Respiratory: Positive for shortness of breath (chronic, esp DOE). Negative for cough and wheezing.   Cardiovascular: Negative for chest pain, palpitations and leg swelling.  Gastrointestinal: Negative for nausea, vomiting, abdominal  pain, diarrhea and constipation.  Endocrine: Positive for polydipsia.  Musculoskeletal:       Leg cramps, esp feet  Neurological: Positive for tremors. Negative for dizziness, seizures, syncope and headaches.  Psychiatric/Behavioral: Positive for dysphoric mood. The patient is nervous/anxious.        Objective:   Physical Exam  Constitutional: She is oriented to person, place, and time.  Neck: Normal range of motion. Neck supple. No thyromegaly present.  Cardiovascular: Normal rate, regular rhythm and normal heart sounds.   No murmur heard. Pulmonary/Chest: Effort normal.  Decreased breath sounds  Musculoskeletal: Normal range of motion.  Lymphadenopathy:    She has no cervical adenopathy.  Neurological: She is alert and oriented to person, place, and time.  Skin: Skin is warm and dry.  Psychiatric: She has a normal mood and affect. Her behavior is normal. Judgment and thought content normal.    Wt Readings from Last 3 Encounters:  09/03/13 301 lb 3.2 oz (136.623 kg)  08/18/13 301 lb (136.533 kg)  08/15/13 297 lb (134.718 kg)   BP Readings from Last 3 Encounters:  09/03/13 118/72  08/25/13 95/66  08/18/13 112/84   Lab Results  Component Value Date   WBC 8.2 08/25/2013   HGB 15.2* 08/25/2013   HCT 44.2 08/25/2013   PLT 173 08/25/2013   GLUCOSE 159* 08/25/2013   CHOL 255* 02/13/2012   TRIG 133.0  02/13/2012   HDL 54.30 02/13/2012   LDLDIRECT 186.4 02/13/2012   LDLCALC  Value: 92        Total Cholesterol/HDL:CHD Risk Coronary Heart Disease Risk Table                     Men   Women  1/2 Average Risk   3.4   3.3  Average Risk       5.0   4.4  2 X Average Risk   9.6   7.1  3 X Average Risk  23.4   11.0        Use the calculated Patient Ratio above and the CHD Risk Table to determine the patient's CHD Risk.        ATP III CLASSIFICATION (LDL):  <100     mg/dL   Optimal  454-098  mg/dL   Near or Above                    Optimal  130-159  mg/dL   Borderline  119-147  mg/dL   High  >829      mg/dL   Very High 5/62/1308   ALT 35 08/25/2013   AST 20 08/25/2013   NA 133* 08/25/2013   K 3.0* 08/25/2013   CL 88* 08/25/2013   CREATININE 1.07 08/25/2013   BUN 18 08/25/2013   CO2 29 08/25/2013   TSH 0.701 04/22/2012   INR 0.96 08/03/2013   HGBA1C 6.2* 06/24/2013       Assessment & Plan:   Hypokalemia. Related to diuretic dosing. Recheck and replace as needed  Also see problem list.  Time spent with pt today 25 minutes, greater than 50% time spent counseling patient on depression with anxiety, CHF, a/c resp failure ,DM2  and medication review. Also review of hospital records

## 2013-09-03 NOTE — Assessment & Plan Note (Signed)
freq exacerbations - acute exac with hosp 06/2013 for COPD flare and again 07/2013 Chronic high dose diuretic - 100mg  demadex bid Added zaroxlyn 3x/week 07/2012 and encouraged med compliance and Na restriction Also CPAP compliance and O2 continuous for chronic resp failure Continue monitor sodium intake and monitor weights at home - call if increase weight >3#/day Encouragement provided on continued weight loss efforts

## 2013-09-03 NOTE — Assessment & Plan Note (Signed)
Chronic BZ - remotely klonopin but ongoing scheduled xanax Also overlap with depression symptoms Exacerbated by resp faillure and air hunger Also exacerbated by freq/high dose steroids rx'd sertraline 08/2011 - increase dose now to 100 mg daily Change benzodiazepine: Xanax back to previous Klonopin - new prescription done Extensive support offered today, denies SI/HI

## 2013-09-04 ENCOUNTER — Telehealth: Payer: Self-pay | Admitting: Internal Medicine

## 2013-09-04 LAB — MICROALBUMIN / CREATININE URINE RATIO
Creatinine,U: 42.6 mg/dL
Microalb Creat Ratio: 1.2 mg/g (ref 0.0–30.0)

## 2013-09-04 LAB — LDL CHOLESTEROL, DIRECT: Direct LDL: 204.3 mg/dL

## 2013-09-04 NOTE — Telephone Encounter (Signed)
Pt called request lab result that was done 09/03/13. Please call pt

## 2013-09-05 ENCOUNTER — Encounter: Payer: Self-pay | Admitting: Internal Medicine

## 2013-09-05 NOTE — Telephone Encounter (Signed)
Results released to my chart today - thanks

## 2013-09-06 ENCOUNTER — Observation Stay (HOSPITAL_COMMUNITY)
Admission: EM | Admit: 2013-09-06 | Discharge: 2013-09-07 | Disposition: A | Discharge status: Home / Self Care | Payer: PRIVATE HEALTH INSURANCE | Attending: Internal Medicine | Admitting: Internal Medicine

## 2013-09-06 ENCOUNTER — Encounter (HOSPITAL_COMMUNITY): Payer: Self-pay | Admitting: Emergency Medicine

## 2013-09-06 ENCOUNTER — Other Ambulatory Visit: Payer: Self-pay | Admitting: Internal Medicine

## 2013-09-06 DIAGNOSIS — J9610 Chronic respiratory failure, unspecified whether with hypoxia or hypercapnia: Secondary | ICD-10-CM | POA: Diagnosis present

## 2013-09-06 DIAGNOSIS — E785 Hyperlipidemia, unspecified: Secondary | ICD-10-CM

## 2013-09-06 DIAGNOSIS — M5431 Sciatica, right side: Secondary | ICD-10-CM

## 2013-09-06 DIAGNOSIS — I509 Heart failure, unspecified: Secondary | ICD-10-CM

## 2013-09-06 DIAGNOSIS — K219 Gastro-esophageal reflux disease without esophagitis: Secondary | ICD-10-CM

## 2013-09-06 DIAGNOSIS — M797 Fibromyalgia: Secondary | ICD-10-CM | POA: Diagnosis present

## 2013-09-06 DIAGNOSIS — Z87891 Personal history of nicotine dependence: Secondary | ICD-10-CM | POA: Insufficient documentation

## 2013-09-06 DIAGNOSIS — N179 Acute kidney failure, unspecified: Secondary | ICD-10-CM | POA: Diagnosis not present

## 2013-09-06 DIAGNOSIS — F411 Generalized anxiety disorder: Secondary | ICD-10-CM

## 2013-09-06 DIAGNOSIS — Z794 Long term (current) use of insulin: Secondary | ICD-10-CM | POA: Insufficient documentation

## 2013-09-06 DIAGNOSIS — Z720 Tobacco use: Secondary | ICD-10-CM

## 2013-09-06 DIAGNOSIS — M25561 Pain in right knee: Secondary | ICD-10-CM

## 2013-09-06 DIAGNOSIS — E876 Hypokalemia: Secondary | ICD-10-CM | POA: Diagnosis present

## 2013-09-06 DIAGNOSIS — J4489 Other specified chronic obstructive pulmonary disease: Secondary | ICD-10-CM

## 2013-09-06 DIAGNOSIS — R0789 Other chest pain: Secondary | ICD-10-CM

## 2013-09-06 DIAGNOSIS — R002 Palpitations: Secondary | ICD-10-CM

## 2013-09-06 DIAGNOSIS — I5030 Unspecified diastolic (congestive) heart failure: Secondary | ICD-10-CM | POA: Diagnosis not present

## 2013-09-06 DIAGNOSIS — I5033 Acute on chronic diastolic (congestive) heart failure: Secondary | ICD-10-CM

## 2013-09-06 DIAGNOSIS — R739 Hyperglycemia, unspecified: Secondary | ICD-10-CM

## 2013-09-06 DIAGNOSIS — D126 Benign neoplasm of colon, unspecified: Secondary | ICD-10-CM | POA: Diagnosis not present

## 2013-09-06 DIAGNOSIS — E1165 Type 2 diabetes mellitus with hyperglycemia: Secondary | ICD-10-CM

## 2013-09-06 DIAGNOSIS — J309 Allergic rhinitis, unspecified: Secondary | ICD-10-CM

## 2013-09-06 DIAGNOSIS — M545 Low back pain, unspecified: Secondary | ICD-10-CM

## 2013-09-06 DIAGNOSIS — I10 Essential (primary) hypertension: Secondary | ICD-10-CM

## 2013-09-06 DIAGNOSIS — J441 Chronic obstructive pulmonary disease with (acute) exacerbation: Secondary | ICD-10-CM

## 2013-09-06 DIAGNOSIS — J449 Chronic obstructive pulmonary disease, unspecified: Secondary | ICD-10-CM

## 2013-09-06 DIAGNOSIS — R7309 Other abnormal glucose: Secondary | ICD-10-CM | POA: Diagnosis present

## 2013-09-06 DIAGNOSIS — D869 Sarcoidosis, unspecified: Secondary | ICD-10-CM

## 2013-09-06 DIAGNOSIS — F329 Major depressive disorder, single episode, unspecified: Secondary | ICD-10-CM

## 2013-09-06 DIAGNOSIS — J9620 Acute and chronic respiratory failure, unspecified whether with hypoxia or hypercapnia: Secondary | ICD-10-CM

## 2013-09-06 DIAGNOSIS — G4733 Obstructive sleep apnea (adult) (pediatric): Secondary | ICD-10-CM

## 2013-09-06 DIAGNOSIS — D649 Anemia, unspecified: Secondary | ICD-10-CM | POA: Diagnosis not present

## 2013-09-06 DIAGNOSIS — I5032 Chronic diastolic (congestive) heart failure: Secondary | ICD-10-CM | POA: Diagnosis present

## 2013-09-06 DIAGNOSIS — F32A Depression, unspecified: Secondary | ICD-10-CM

## 2013-09-06 DIAGNOSIS — N939 Abnormal uterine and vaginal bleeding, unspecified: Secondary | ICD-10-CM

## 2013-09-06 DIAGNOSIS — E662 Morbid (severe) obesity with alveolar hypoventilation: Secondary | ICD-10-CM

## 2013-09-06 DIAGNOSIS — IMO0001 Reserved for inherently not codable concepts without codable children: Secondary | ICD-10-CM | POA: Diagnosis not present

## 2013-09-06 DIAGNOSIS — IMO0002 Reserved for concepts with insufficient information to code with codable children: Secondary | ICD-10-CM | POA: Diagnosis present

## 2013-09-06 DIAGNOSIS — R9430 Abnormal result of cardiovascular function study, unspecified: Secondary | ICD-10-CM

## 2013-09-06 DIAGNOSIS — R51 Headache: Secondary | ICD-10-CM

## 2013-09-06 LAB — BASIC METABOLIC PANEL
BUN: 29 mg/dL — ABNORMAL HIGH (ref 6–23)
BUN: 28 mg/dL — ABNORMAL HIGH (ref 6–23)
BUN: 23 mg/dL (ref 6–23)
CO2: 36 mEq/L — ABNORMAL HIGH (ref 19–32)
CO2: 33 mEq/L — ABNORMAL HIGH (ref 19–32)
Calcium: 9.9 mg/dL (ref 8.4–10.5)
Calcium: 9.3 mg/dL (ref 8.4–10.5)
Calcium: 9.2 mg/dL (ref 8.4–10.5)
Chloride: 84 mEq/L — ABNORMAL LOW (ref 96–112)
Chloride: 90 mEq/L — ABNORMAL LOW (ref 96–112)
Creatinine, Ser: 1.14 mg/dL — ABNORMAL HIGH (ref 0.50–1.10)
Creatinine, Ser: 0.99 mg/dL (ref 0.50–1.10)
Creatinine, Ser: 0.83 mg/dL (ref 0.50–1.10)
GFR calc Af Amer: 78 mL/min — ABNORMAL LOW (ref >90)
GFR calc Af Amer: >90 mL/min (ref >90)
GFR calc non Af Amer: 57 mL/min — ABNORMAL LOW (ref >90)
GFR calc non Af Amer: 67 mL/min — ABNORMAL LOW (ref >90)
GFR calc non Af Amer: 83 mL/min — ABNORMAL LOW (ref >90)
Glucose, Bld: 204 mg/dL — ABNORMAL HIGH (ref 70–99)
Glucose, Bld: 192 mg/dL — ABNORMAL HIGH (ref 70–99)
Glucose, Bld: 144 mg/dL — ABNORMAL HIGH (ref 70–99)
Potassium: 3.2 mEq/L — ABNORMAL LOW (ref 3.5–5.1)
Sodium: 132 mEq/L — ABNORMAL LOW (ref 135–145)

## 2013-09-06 LAB — GLUCOSE, CAPILLARY
Glucose-Capillary: 173 mg/dL — ABNORMAL HIGH (ref 70–99)
Glucose-Capillary: 180 mg/dL — ABNORMAL HIGH (ref 70–99)
Glucose-Capillary: 191 mg/dL — ABNORMAL HIGH (ref 70–99)
Glucose-Capillary: 196 mg/dL — ABNORMAL HIGH (ref 70–99)
Glucose-Capillary: 198 mg/dL — ABNORMAL HIGH (ref 70–99)
Glucose-Capillary: 204 mg/dL — ABNORMAL HIGH (ref 70–99)
Glucose-Capillary: 239 mg/dL — ABNORMAL HIGH (ref 70–99)

## 2013-09-06 LAB — URINALYSIS, ROUTINE W REFLEX MICROSCOPIC
Bilirubin Urine: NEGATIVE
Hgb urine dipstick: NEGATIVE
Ketones, ur: NEGATIVE mg/dL
Nitrite: NEGATIVE
Specific Gravity, Urine: 1.013 (ref 1.005–1.030)
Urobilinogen, UA: 0.2 mg/dL (ref 0.0–1.0)
pH: 6 (ref 5.0–8.0)

## 2013-09-06 LAB — HEMOGLOBIN A1C
Hgb A1c MFr Bld: 7.3 % — ABNORMAL HIGH
Mean Plasma Glucose: 163 mg/dL — ABNORMAL HIGH

## 2013-09-06 MED ORDER — OXYCODONE HCL 5 MG PO TABS
10.0000 mg | ORAL_TABLET | Freq: Three times a day (TID) | ORAL | Status: DC | PRN
  Administered 2013-09-06 – 2013-09-07 (×3): 10 mg via ORAL
  Filled 2013-09-06 (×3): qty 2

## 2013-09-06 MED ORDER — TIOTROPIUM BROMIDE MONOHYDRATE 18 MCG IN CAPS
18.0000 ug | ORAL_CAPSULE | Freq: Every day | RESPIRATORY_TRACT | Status: DC
  Administered 2013-09-06: 18 ug via RESPIRATORY_TRACT
  Filled 2013-09-06: qty 5

## 2013-09-06 MED ORDER — CLONAZEPAM 0.5 MG PO TABS: 0.5000 mg | ORAL_TABLET | Freq: Two times a day (BID) | ORAL | Status: DC | PRN

## 2013-09-06 MED ORDER — CALCIUM CARBONATE ANTACID 500 MG PO CHEW
1.0000 | CHEWABLE_TABLET | Freq: Three times a day (TID) | ORAL | Status: DC
  Administered 2013-09-06 – 2013-09-07 (×3): 200 mg via ORAL
  Filled 2013-09-06 (×5): qty 1

## 2013-09-06 MED ORDER — MORPHINE SULFATE 4 MG/ML IJ SOLN
4.0000 mg | Freq: Once | INTRAMUSCULAR | Status: AC
  Administered 2013-09-06: 4 mg via INTRAVENOUS
  Filled 2013-09-06: qty 1

## 2013-09-06 MED ORDER — LEVOFLOXACIN 750 MG PO TABS: 750.0000 mg | ORAL_TABLET | Freq: Every day | ORAL | Status: DC

## 2013-09-06 MED ORDER — ONDANSETRON HCL 4 MG/2ML IJ SOLN: 4.0000 mg | Freq: Four times a day (QID) | INTRAMUSCULAR | Status: DC | PRN

## 2013-09-06 MED ORDER — HEPARIN SODIUM (PORCINE) 5000 UNIT/ML IJ SOLN
5000.0000 [IU] | Freq: Three times a day (TID) | INTRAMUSCULAR | Status: DC
  Administered 2013-09-06 – 2013-09-07 (×3): 5000 [IU] via SUBCUTANEOUS
  Filled 2013-09-06 (×6): qty 1

## 2013-09-06 MED ORDER — MORPHINE SULFATE 2 MG/ML IJ SOLN: 2.0000 mg | INTRAMUSCULAR | Status: DC | PRN

## 2013-09-06 MED ORDER — GLIMEPIRIDE 1 MG PO TABS
1.0000 mg | ORAL_TABLET | Freq: Every day | ORAL | Status: DC
  Administered 2013-09-07: 1 mg via ORAL
  Filled 2013-09-06 (×2): qty 1

## 2013-09-06 MED ORDER — ALBUTEROL SULFATE HFA 108 (90 BASE) MCG/ACT IN AERS
2.0000 | INHALATION_SPRAY | Freq: Four times a day (QID) | RESPIRATORY_TRACT | Status: DC | PRN
  Filled 2013-09-06: qty 6.7

## 2013-09-06 MED ORDER — POTASSIUM CHLORIDE CRYS ER 20 MEQ PO TBCR
60.0000 meq | EXTENDED_RELEASE_TABLET | Freq: Four times a day (QID) | ORAL | Status: AC
  Administered 2013-09-06 (×2): 60 meq via ORAL
  Filled 2013-09-06 (×2): qty 3

## 2013-09-06 MED ORDER — FLUTICASONE PROPIONATE 50 MCG/ACT NA SUSP
2.0000 | Freq: Every day | NASAL | Status: DC | PRN
  Administered 2013-09-06: 21:00:00 2 via NASAL
  Filled 2013-09-06 (×2): qty 16

## 2013-09-06 MED ORDER — TIOTROPIUM BROMIDE MONOHYDRATE 18 MCG IN CAPS
18.0000 ug | ORAL_CAPSULE | Freq: Every day | RESPIRATORY_TRACT | Status: DC
  Administered 2013-09-07: 12:00:00 18 ug via RESPIRATORY_TRACT
  Filled 2013-09-06 (×2): qty 5

## 2013-09-06 MED ORDER — ASPIRIN EC 81 MG PO TBEC
81.0000 mg | DELAYED_RELEASE_TABLET | Freq: Every day | ORAL | Status: DC
  Administered 2013-09-06 – 2013-09-07 (×2): 81 mg via ORAL
  Filled 2013-09-06 (×2): qty 1

## 2013-09-06 MED ORDER — ALPRAZOLAM 0.5 MG PO TABS
1.0000 mg | ORAL_TABLET | Freq: Two times a day (BID) | ORAL | Status: DC | PRN
  Administered 2013-09-06: 1 mg via ORAL
  Filled 2013-09-06: qty 2

## 2013-09-06 MED ORDER — INSULIN ASPART 100 UNIT/ML ~~LOC~~ SOLN
0.0000 [IU] | Freq: Three times a day (TID) | SUBCUTANEOUS | Status: DC
  Administered 2013-09-06 – 2013-09-07 (×2): 2 [IU] via SUBCUTANEOUS

## 2013-09-06 MED ORDER — SPIRONOLACTONE 25 MG PO TABS
25.0000 mg | ORAL_TABLET | Freq: Two times a day (BID) | ORAL | Status: DC
  Administered 2013-09-06 – 2013-09-07 (×2): 25 mg via ORAL
  Filled 2013-09-06 (×4): qty 1

## 2013-09-06 MED ORDER — INSULIN GLARGINE 100 UNIT/ML ~~LOC~~ SOLN
30.0000 [IU] | Freq: Two times a day (BID) | SUBCUTANEOUS | Status: DC
  Administered 2013-09-06 – 2013-09-07 (×2): 30 [IU] via SUBCUTANEOUS
  Filled 2013-09-06 (×3): qty 0.3

## 2013-09-06 MED ORDER — TIZANIDINE HCL 4 MG PO TABS
4.0000 mg | ORAL_TABLET | Freq: Three times a day (TID) | ORAL | Status: DC | PRN
  Filled 2013-09-06 (×2): qty 1

## 2013-09-06 MED ORDER — SERTRALINE HCL 100 MG PO TABS
100.0000 mg | ORAL_TABLET | Freq: Every day | ORAL | Status: DC
  Administered 2013-09-06 – 2013-09-07 (×2): 100 mg via ORAL
  Filled 2013-09-06 (×2): qty 1

## 2013-09-06 MED ORDER — SODIUM CHLORIDE 0.9 % IV BOLUS (SEPSIS)
1000.0000 mL | Freq: Once | INTRAVENOUS | Status: AC
  Administered 2013-09-06: 1000 mL via INTRAVENOUS

## 2013-09-06 MED ORDER — SODIUM CHLORIDE 0.9 % IJ SOLN
3.0000 mL | Freq: Two times a day (BID) | INTRAMUSCULAR | Status: DC
  Administered 2013-09-06 – 2013-09-07 (×2): 3 mL via INTRAVENOUS

## 2013-09-06 MED ORDER — MAGNESIUM SULFATE 40 MG/ML IJ SOLN
2.0000 g | Freq: Once | INTRAMUSCULAR | Status: AC
  Administered 2013-09-06: 17:00:00 2 g via INTRAVENOUS
  Filled 2013-09-06: qty 50

## 2013-09-06 MED ORDER — POTASSIUM CHLORIDE CRYS ER 20 MEQ PO TBCR
40.0000 meq | EXTENDED_RELEASE_TABLET | Freq: Once | ORAL | Status: AC
  Administered 2013-09-06: 40 meq via ORAL
  Filled 2013-09-06: qty 2

## 2013-09-06 MED ORDER — ALUM & MAG HYDROXIDE-SIMETH 200-200-20 MG/5ML PO SUSP: 30.0000 mL | Freq: Four times a day (QID) | ORAL | Status: DC | PRN

## 2013-09-06 MED ORDER — TORSEMIDE 100 MG PO TABS
100.0000 mg | ORAL_TABLET | Freq: Two times a day (BID) | ORAL | Status: DC
  Administered 2013-09-06: 100 mg via ORAL
  Filled 2013-09-06 (×4): qty 1

## 2013-09-06 MED ORDER — POTASSIUM CHLORIDE IN NACL 20-0.9 MEQ/L-% IV SOLN
INTRAVENOUS | Status: AC
  Administered 2013-09-06: 06:00:00 via INTRAVENOUS
  Filled 2013-09-06: qty 1000

## 2013-09-06 MED ORDER — BUDESONIDE-FORMOTEROL FUMARATE 160-4.5 MCG/ACT IN AERO
2.0000 | INHALATION_SPRAY | Freq: Two times a day (BID) | RESPIRATORY_TRACT | Status: DC
  Administered 2013-09-07: 12:00:00 2 via RESPIRATORY_TRACT
  Filled 2013-09-06 (×2): qty 6

## 2013-09-06 MED ORDER — INSULIN GLARGINE 100 UNIT/ML ~~LOC~~ SOLN: 20.0000 [IU] | SUBCUTANEOUS | Status: DC

## 2013-09-06 MED ORDER — ONDANSETRON HCL 4 MG PO TABS
4.0000 mg | ORAL_TABLET | Freq: Four times a day (QID) | ORAL | Status: DC | PRN
  Administered 2013-09-07: 16:00:00 4 mg via ORAL
  Filled 2013-09-06: qty 1

## 2013-09-06 MED ORDER — BUDESONIDE-FORMOTEROL FUMARATE 160-4.5 MCG/ACT IN AERO
2.0000 | INHALATION_SPRAY | Freq: Two times a day (BID) | RESPIRATORY_TRACT | Status: DC
  Administered 2013-09-06: 2 via RESPIRATORY_TRACT
  Filled 2013-09-06: qty 6

## 2013-09-06 MED ORDER — GABAPENTIN 300 MG PO CAPS
300.0000 mg | ORAL_CAPSULE | Freq: Two times a day (BID) | ORAL | Status: DC
  Filled 2013-09-06 (×4): qty 1

## 2013-09-06 MED ORDER — PANTOPRAZOLE SODIUM 40 MG PO TBEC
40.0000 mg | DELAYED_RELEASE_TABLET | Freq: Every day | ORAL | Status: DC
  Administered 2013-09-06 – 2013-09-07 (×2): 40 mg via ORAL
  Filled 2013-09-06 (×2): qty 1

## 2013-09-06 MED ORDER — METOLAZONE 2.5 MG PO TABS: 2.5000 mg | ORAL_TABLET | ORAL | Status: DC

## 2013-09-06 NOTE — H&P (Signed)
[facility] History and Physical  [name] MWN:[id number] DOB: [birth year] DOA: 09/06/2013  Referring physician:  PCP: Rene Paci, MD   Chief Complaint: Generalized weakness   HPI: [name] is a 47 y.o. female with past medical history of diabetes mellitus type 2 end diastolic CHF came in to the hospital with a hyperglycemia and hypokalemia. Potassium was 2.7 on admission, blood glucose was 239. The patient referred to [facility] for admission replete potassium   Review of Systems:  Constitutional: negative for anorexia, fevers and sweats Eyes: negative for irritation, redness and visual disturbance Ears, nose, mouth, throat, and face: negative for earaches, epistaxis, nasal congestion and sore throat Respiratory: negative for cough, dyspnea on exertion, sputum and wheezing Cardiovascular: negative for chest pain, dyspnea, lower extremity edema, orthopnea, palpitations and syncope Gastrointestinal: negative for abdominal pain, constipation, diarrhea, melena, nausea and vomiting Genitourinary:negative for dysuria, frequency and hematuria Hematologic/lymphatic: negative for bleeding, easy bruising and lymphadenopathy Musculoskeletal:negative for arthralgias, muscle weakness and stiff joints Neurological: negative for coordination problems, gait problems, headaches and weakness Endocrine: negative for diabetic symptoms including polydipsia, polyuria and weight loss Allergic/Immunologic: negative for anaphylaxis, hay fever and urticaria   Past Medical History  Diagnosis Date  . Hypertension   . Hyperlipidemia   . Chronic headache   . Fibromyalgia     daily narcotics  . Anxiety     hx chronic BZ use, stopped 07/2010  . Anemia   . Pulmonary sarcoidosis     unimpressive CT chest 2011  . Colonic polyp   . GERD (gastroesophageal reflux disease)   . ALLERGIC RHINITIS   . Asthma   . CHF (congestive heart failure)     Diastolic with  fluid overload, May, 2012, LVEF 60%  . Morbid obesity   . Depression   . Panic attacks   . Diabetes mellitus   . COPD (chronic obstructive pulmonary disease)     on home O2, moderate airflow obstruction, suspect d/t emphysema  . Obesity   . Elevated LFTs 09/2011  . Ovarian cyst   . Chronic back pain   . Shortness of breath   . Sleep apnea      CPAP   Past Surgical History  Procedure Laterality Date  . Polypectomy  2011  . Lumbar microdiscectomy  07/06/2011    R L4-5, stern  . Back surgery    . Carpal tunnel release    . Steroid spinal injections    . Hysteroscopy w/d&c N/A 03/25/2013    Procedure: DILATATION AND CURETTAGE /HYSTEROSCOPY;  Surgeon: Lavina Hamman, MD;  Location: [facility];  Service: Gynecology;  Laterality: N/A;  . Dilation and curettage of uterus     Social History:  reports that she quit smoking about 2 months ago. Her smoking use included Cigarettes. She has a 7.25 pack-year smoking history. She has never used smokeless tobacco. She reports that she drinks alcohol. She reports that she uses illicit drugs (Cocaine and Marijuana).  Allergies  Allergen Reactions  . Simvastatin Other (See Comments)    Severe leg pain and burning  . Sulfamethoxazole-Trimethoprim Nausea And Vomiting    Causes Projectile Vomiting  . Zolpidem Tartrate Other (See Comments)    Chest pain  . Azithromycin Other (See Comments)    Resistant to med  . Doxycycline Other (See Comments)    Resistant to med  . Latex Itching and Rash    Also burning sensations  . Metformin And Related Diarrhea    Severe diarrhea  . Metronidazole  Nausea And Vomiting  . Ciprofloxacin Nausea Only    Family History  Problem Relation Age of Onset  . Hypertension Mother   . Emphysema Father   . Hypertension Father   . Stomach cancer Father   . Allergies Brother   . Hypertension Brother   . Stomach cancer Brother   . Heart disease Mother   . Heart disease Father   . Heart disease Brother     died age 90  sudden death/MI     Prior to Admission medications   Medication Sig Start Date End Date Taking? Authorizing Provider  albuterol (PROVENTIL HFA;VENTOLIN HFA) 108 (90 BASE) MCG/ACT inhaler Inhale 2 puffs into the lungs every 6 (six) hours as needed for wheezing or shortness of breath.   Yes Historical Provider, MD  albuterol (PROVENTIL) (2.5 MG/3ML) 0.083% nebulizer solution Take 2.5 mg by nebulization every 6 (six) hours as needed for wheezing.   Yes Historical Provider, MD  ALPRAZolam Prudy Feeler) 1 MG tablet Take 1 tablet by mouth 2 (two) times daily. Scheduled doses 08/25/13  Yes Historical Provider, MD  aspirin EC 81 MG tablet Take 1 tablet (81 mg total) by mouth daily. 06/06/13  Yes Newt Lukes, MD  budesonide-formoterol (SYMBICORT) 160-4.5 MCG/ACT inhaler Inhale 2 puffs into the lungs 2 (two) times daily. 09/04/12  Yes Richarda Overlie, MD  cetirizine (ZYRTEC) 1 MG/ML syrup Take 10 mg by mouth at bedtime.   Yes Historical Provider, MD  clonazePAM (KLONOPIN) 1 MG tablet Take 0.5-1 tablets (0.5-1 mg total) by mouth 2 (two) times daily as needed for anxiety. 09/03/13  Yes Newt Lukes, MD  esomeprazole (NEXIUM) 40 MG capsule Take 1 capsule (40 mg total) by mouth 2 (two) times daily. 01/23/13  Yes Newt Lukes, MD  fluticasone (FLONASE) 50 MCG/ACT nasal spray Place 2 sprays into both nostrils daily as needed for rhinitis. 08/01/13  Yes Newt Lukes, MD  glimepiride (AMARYL) 1 MG tablet Take 1 mg by mouth daily before breakfast.   Yes Historical Provider, MD  insulin aspart (NOVOLOG) 100 UNIT/ML injection Inject 0-10 Units into the skin 3 (three) times daily before meals. CBG less than 140: 0 units 141-200: 2 units 201-250: 4 units 251-300: 6 units 301-350: 8 units 351-400: 10 units 400+: call MD   Yes Historical Provider, MD  insulin glargine (LANTUS) 100 UNIT/ML injection Inject 20-40 Units into the skin See admin instructions. Takes 20 units every evening. Also takes 20 units in  the morning only if sugar is over 200 07/21/13  Yes Newt Lukes, MD  Liraglutide 18 MG/3ML SOPN Inject 0.6 mg into the skin daily. 09/03/13  Yes Newt Lukes, MD  Oxycodone HCl 10 MG TABS Take 1 tablet (10 mg total) by mouth 3 (three) times daily as needed (pain). May fill no more than once every 30 days. 09/02/13  Yes Newt Lukes, MD  potassium chloride SA (K-DUR,KLOR-CON) 20 MEQ tablet Take 1 tablet (20 mEq total) by mouth daily. 07/04/13  Yes Newt Lukes, MD  promethazine (PHENERGAN) 25 MG tablet TAKE 1 TABLET BY MOUTH EVERY 6 HOURS AS NEEDED FOR NAUSEA 08/29/13  Yes Newt Lukes, MD  ranitidine (ZANTAC) 150 MG tablet Take 1 tablet (150 mg total) by mouth 2 (two) times daily. 09/03/13  Yes Newt Lukes, MD  sertraline (ZOLOFT) 100 MG tablet Take 1 tablet (100 mg total) by mouth daily. 09/03/13  Yes Newt Lukes, MD  spironolactone (ALDACTONE) 25 MG tablet Take 1 tablet (  25 mg total) by mouth 2 (two) times daily. 07/04/13  Yes Newt Lukes, MD  tiotropium (SPIRIVA) 18 MCG inhalation capsule Place 18 mcg into inhaler and inhale daily.   Yes Historical Provider, MD  tiZANidine (ZANAFLEX) 4 MG tablet Take 4 mg by mouth every 8 (eight) hours as needed for muscle spasms.   Yes Historical Provider, MD  torsemide (DEMADEX) 100 MG tablet Take 100 mg by mouth 2 (two) times daily.   Yes Historical Provider, MD  calcium carbonate (TUMS) 500 MG chewable tablet Chew 1 tablet (200 mg of elemental calcium total) by mouth 3 (three) times daily with meals. 09/03/13   Newt Lukes, MD  fluconazole (DIFLUCAN) 150 MG tablet Take 1 tablet (150 mg total) by mouth once. May repeat dose next day if needed 09/03/13   Newt Lukes, MD  gabapentin (NEURONTIN) 300 MG capsule Take 1 capsule by mouth 2 (two) times daily. 08/26/13   Historical Provider, MD  levofloxacin (LEVAQUIN) 750 MG tablet Take 1 tablet by mouth daily. 08/25/13   Historical Provider, MD  metolazone  (ZAROXOLYN) 2.5 MG tablet TAKE 1 TABLET BY MOUTH THREE TIMES WEEKLY 08/28/13   Newt Lukes, MD   Physical Exam:   BP 135/77  Pulse 91  Temp(Src) 97.7 F (36.5 C) (Oral)  Resp 20  Ht 5\' 8"  (1.727 m)  Wt 137.3 kg (302 lb 11.1 oz)  BMI 46.03 kg/m2  SpO2 94%  LMP 10/16/2012 General appearance: alert, cooperative and no distress  Head: Normocephalic, without obvious abnormality, atraumatic  Eyes: conjunctivae/corneas clear. PERRL, EOM's intact. Fundi benign.  Nose: Nares normal. Septum midline. Mucosa normal. No drainage or sinus tenderness.  Throat: lips, mucosa, and tongue normal; teeth and gums normal  Neck: Supple, no masses, no cervical lymphadenopathy, no JVD appreciated, no meningeal signs Resp: clear to auscultation bilaterally  Chest wall: no tenderness  Cardio: regular rate and rhythm, S1, S2 normal, no murmur, click, rub or gallop  GI: soft, non-tender; bowel sounds normal; no masses, no organomegaly  Extremities: extremities normal, atraumatic, no cyanosis or edema  Skin: Skin color, texture, turgor normal. No rashes or lesions  Neurologic: Alert and oriented X 3, normal strength and tone. Normal symmetric reflexes. Normal coordination and gait  labs on Admission:  Basic Metabolic Panel:  Recent Labs Lab 09/03/13 1648 09/06/13 0108 09/06/13 0450 09/06/13 0820  NA 132* 132* 132* 133*  K 3.4* 2.7* 3.9 3.2*  CL 83* 84* 87* 90*  CO2 36* 36* 33* 31  GLUCOSE 199* 204* 192* 144*  BUN 29* 29* 28* 23  CREATININE 1.5* 1.14* 0.99 0.83  CALCIUM 9.8 9.9 9.3 9.2   Liver Function Tests: No results found for this basename: AST, ALT, ALKPHOS, BILITOT, PROT, ALBUMIN,  in the last 168 hours No results found for this basename: LIPASE, AMYLASE,  in the last 168 hours No results found for this basename: AMMONIA,  in the last 168 hours CBC: No results found for this basename: WBC, NEUTROABS, HGB, HCT, MCV, PLT,  in the last 168 hours Cardiac Enzymes: No results found for  this basename: CKTOTAL, CKMB, CKMBINDEX, TROPONINI,  in the last 168 hours  BNP (last 3 results)  Recent Labs  06/24/13 1617 08/03/13 0046 08/25/13 1509  PROBNP 61.2 2051.0* 9.6   CBG:  Recent Labs Lab 09/06/13 0031 09/06/13 0114 09/06/13 0426 09/06/13 0558 09/06/13 0701  GLUCAP 239* 204* 196* 191* 180*    Radiological Exams on Admission: No results found.  EKG: Independently reviewed.  Assessment/Plan Active Problems:   Chronic respiratory failure   FIBROMYALGIA   Diabetes type 2, uncontrolled   Chronic diastolic CHF (congestive heart failure)   Hypokalemia   Hypokalemia -Replace with oral supplements. This is likely secondary to oral diuretics. -Give IV magnesium, check potassium and magnesium. -Likely needs potassium supplements on discharge.  Chronic respiratory failure -Secondary to chronic diastolic CHF and COPD, on oxygen at home. -Continue home oxygen, no changes.  Diabetes mellitus type 2, uncontrolled -Slightly uncontrolled with hemoglobin A1c of 7.2 -Continue home medications, last note from PCP Shawn Lantus 30 twice a day.  Fibromyalgia -Patient is on chronic narcotics on oxycodone as needed at home. Restarted.  Code Status: Full code Family Communication: Plan discussed with the patient Disposition Plan: Observation, likely to be discharged in a.m.  Time spent: 70 minutes  [facility] Pager [phone]

## 2013-09-06 NOTE — Progress Notes (Signed)
Patient requesting PRN Flonase for nasal dryness, Xanax for anxiety and Oxy for chronic back pain.  All medications  administered as ordered. Patient currently resting comfortably, will continue to monitor. Troy Sine

## 2013-09-06 NOTE — ED Notes (Signed)
The pt has had a headache all day no nv or diarrhea.  The pt is alert no distress

## 2013-09-06 NOTE — ED Notes (Addendum)
Pt reports CBG elevated (187-316) at home today.  Reports generalized weakness and feeling tired.  Also c/o headache and chronic R hip pain from sciatica.  Pulse Ox 86% on arrival.

## 2013-09-06 NOTE — ED Notes (Signed)
Iv team on the way

## 2013-09-06 NOTE — ED Notes (Signed)
Iv team here 

## 2013-09-06 NOTE — ED Notes (Signed)
Unsuccessful attempts to start  An iv.  Iv team called

## 2013-09-06 NOTE — Progress Notes (Addendum)
Pt. Became tearful and expressed her uncertainty of her outcome with her current health issues. Pt. Expressed needing emotional support for her sadness and depression symptoms. I placed sticky note on pt.'s chart. Pt. Denied any suicidal thoughts but did want to speak with therapist if possible.

## 2013-09-06 NOTE — ED Notes (Signed)
Pt given a [place] sandwich and a drink of water.

## 2013-09-06 NOTE — ED Provider Notes (Addendum)
CSN: [id number]     Arrival date & time 09/06/13  0014 History   First MD Initiated Contact with Patient 09/06/13 0101     Chief Complaint  Patient presents with  . Hyperglycemia   (Consider location/radiation/quality/duration/timing/severity/associated sxs/prior Treatment) HPI Comments: 47 yo AA female presents to ER with cc of elevated BG and weakness.    Pt feels fatigued as well.  PMH DM, OSA, obesity, HA, fibromyalgia, anxiety, Sarcoid, GERD, asthma, CHF, depression, COPD.    Pt is on home O2 3-4L via [place].    On H and P pt is without complaint.  BG improved.    Patient is a 47 y.o. female presenting with hyperglycemia. The history is provided by the patient.  Hyperglycemia Blood sugar level PTA:  BG was 316 at home @ 2253 tonight Severity:  Mild Onset quality:  Gradual Timing:  Constant Progression:  Partially resolved Chronicity:  Recurrent Diabetes status:  Controlled with insulin Current diabetic therapy:  Insulin 20 Lantus at night and Novolog SS Context: recent change in diet   Context: not insulin pump use, not new diabetes diagnosis, not noncompliance and not recent illness   Relieved by:  Insulin Associated symptoms: fatigue, increased thirst, polyuria and weakness   Associated symptoms comment:  Weakness   Past Medical History  Diagnosis Date  . Hypertension   . Hyperlipidemia   . Chronic headache   . Fibromyalgia     daily narcotics  . Anxiety     hx chronic BZ use, stopped 07/2010  . Anemia   . Pulmonary sarcoidosis     unimpressive CT chest 2011  . Colonic polyp   . GERD (gastroesophageal reflux disease)   . ALLERGIC RHINITIS   . Asthma   . CHF (congestive heart failure)     Diastolic with fluid overload, May, 2012, LVEF 60%  . Morbid obesity   . Depression   . Panic attacks   . Diabetes mellitus   . COPD (chronic obstructive pulmonary disease)     on home O2, moderate airflow obstruction, suspect d/t emphysema  . Obesity   . Elevated LFTs  09/2011  . Ovarian cyst   . Chronic back pain   . Shortness of breath   . Sleep apnea      CPAP   Past Surgical History  Procedure Laterality Date  . Polypectomy  2011  . Lumbar microdiscectomy  07/06/2011    R L4-5, stern  . Back surgery    . Carpal tunnel release    . Steroid spinal injections    . Hysteroscopy w/d&c N/A 03/25/2013    Procedure: DILATATION AND CURETTAGE /HYSTEROSCOPY;  Surgeon: Lavina Hamman, MD;  Location: [facility];  Service: Gynecology;  Laterality: N/A;  . Dilation and curettage of uterus     Family History  Problem Relation Age of Onset  . Hypertension Mother   . Emphysema Father   . Hypertension Father   . Stomach cancer Father   . Allergies Brother   . Hypertension Brother   . Stomach cancer Brother   . Heart disease Mother   . Heart disease Father   . Heart disease Brother     died age 13 sudden death/MI   History  Substance Use Topics  . Smoking status: Former Smoker -- 0.25 packs/day for 29 years    Types: Cigarettes    Quit date: 06/24/2013  . Smokeless tobacco: Never Used     Comment: Resumed smoking September 2013 --2-3 CIGS DAILY AND E-CIG  .  Alcohol Use: Yes     Comment: occasionally   OB History   Grav Para Term Preterm Abortions TAB SAB Ect Mult Living                 Review of Systems  Constitutional: Positive for fatigue.  HENT: Negative.   Eyes: Negative.   Respiratory: Negative.   Cardiovascular: Negative.   Gastrointestinal: Negative.   Endocrine: Positive for polydipsia, polyphagia and polyuria.  Genitourinary: Negative.   Musculoskeletal: Negative.   Skin: Negative.   Neurological: Positive for weakness.    Allergies  Simvastatin; Sulfamethoxazole-trimethoprim; Zolpidem tartrate; Azithromycin; Doxycycline; Latex; Metformin and related; Metronidazole; and Ciprofloxacin  Home Medications   No current outpatient prescriptions on file. BP 135/77  Pulse 91  Temp(Src) 97.7 F (36.5 C) (Oral)  Resp 20  Ht 5\' 8"   (1.727 m)  Wt 302 lb 11.1 oz (137.3 kg)  BMI 46.03 kg/m2  SpO2 94%  LMP 10/16/2012 Physical Exam  Nursing note and vitals reviewed. Constitutional: She is oriented to person, place, and time. She appears well-developed and well-nourished.  HENT:  Head: Normocephalic and atraumatic.  Eyes: Conjunctivae and EOM are normal. Pupils are equal, round, and reactive to light. Right eye exhibits no discharge. Left eye exhibits no discharge.  Neck: Neck supple.  Cardiovascular: Normal rate and regular rhythm.   Pulmonary/Chest: Effort normal and breath sounds normal. No respiratory distress. She has no wheezes. She exhibits no tenderness.  Abdominal: Soft. Bowel sounds are normal. There is no tenderness. There is no rebound and no guarding.  Musculoskeletal: Normal range of motion. She exhibits no edema and no tenderness.  Neurological: She is alert and oriented to person, place, and time. She has normal reflexes. No cranial nerve deficit. Coordination normal.  Skin: Skin is warm and dry.    ED Course  Procedures (including critical care time) Labs Review Results for orders placed during the hospital encounter of 09/06/13  MRSA PCR SCREENING      Result Value Range   MRSA by PCR NEGATIVE  NEGATIVE  GLUCOSE, CAPILLARY      Result Value Range   Glucose-Capillary 239 (*) 70 - 99 mg/dL  BASIC METABOLIC PANEL      Result Value Range   Sodium 132 (*) 135 - 145 mEq/L   Potassium 2.7 (*) 3.5 - 5.1 mEq/L   Chloride 84 (*) 96 - 112 mEq/L   CO2 36 (*) 19 - 32 mEq/L   Glucose, Bld 204 (*) 70 - 99 mg/dL   BUN 29 (*) 6 - 23 mg/dL   Creatinine, Ser 1.61 (*) 0.50 - 1.10 mg/dL   Calcium 9.9  8.4 - 09.6 mg/dL   GFR calc non Af Amer 57 (*) >90 mL/min   GFR calc Af Amer 66 (*) >90 mL/min  BASIC METABOLIC PANEL      Result Value Range   Sodium 132 (*) 135 - 145 mEq/L   Potassium 3.9  3.5 - 5.1 mEq/L   Chloride 87 (*) 96 - 112 mEq/L   CO2 33 (*) 19 - 32 mEq/L   Glucose, Bld 192 (*) 70 - 99 mg/dL    BUN 28 (*) 6 - 23 mg/dL   Creatinine, Ser 0.45  0.50 - 1.10 mg/dL   Calcium 9.3  8.4 - 40.9 mg/dL   GFR calc non Af Amer 67 (*) >90 mL/min   GFR calc Af Amer 78 (*) >90 mL/min  BASIC METABOLIC PANEL      Result Value Range  Sodium 133 (*) 135 - 145 mEq/L   Potassium 3.2 (*) 3.5 - 5.1 mEq/L   Chloride 90 (*) 96 - 112 mEq/L   CO2 31  19 - 32 mEq/L   Glucose, Bld 144 (*) 70 - 99 mg/dL   BUN 23  6 - 23 mg/dL   Creatinine, Ser 1.61  0.50 - 1.10 mg/dL   Calcium 9.2  8.4 - 09.6 mg/dL   GFR calc non Af Amer 83 (*) >90 mL/min   GFR calc Af Amer >90  >90 mL/min  URINALYSIS, ROUTINE W REFLEX MICROSCOPIC      Result Value Range   Color, Urine YELLOW  YELLOW   APPearance CLEAR  CLEAR   Specific Gravity, Urine 1.013  1.005 - 1.030   pH 6.0  5.0 - 8.0   Glucose, UA NEGATIVE  NEGATIVE mg/dL   Hgb urine dipstick NEGATIVE  NEGATIVE   Bilirubin Urine NEGATIVE  NEGATIVE   Ketones, ur NEGATIVE  NEGATIVE mg/dL   Protein, ur NEGATIVE  NEGATIVE mg/dL   Urobilinogen, UA 0.2  0.0 - 1.0 mg/dL   Nitrite NEGATIVE  NEGATIVE   Leukocytes, UA NEGATIVE  NEGATIVE  PREGNANCY, URINE      Result Value Range   Preg Test, Ur NEGATIVE  NEGATIVE  GLUCOSE, CAPILLARY      Result Value Range   Glucose-Capillary 204 (*) 70 - 99 mg/dL  GLUCOSE, CAPILLARY      Result Value Range   Glucose-Capillary 196 (*) 70 - 99 mg/dL  GLUCOSE, CAPILLARY      Result Value Range   Glucose-Capillary 191 (*) 70 - 99 mg/dL  GLUCOSE, CAPILLARY      Result Value Range   Glucose-Capillary 180 (*) 70 - 99 mg/dL     MDM   1. Diabetes type 2, uncontrolled   2. Hypokalemia   3. Hyperglycemia    47 year old AA female with multiple medical problems to include OSA, hypertension, hyperlipidemia, chronic headaches, fibromyalgia, anxiety, anemia, pulmonary sarcoidosis, GERD, asthma, CHF, morbid obesity, depression, panic attacks, diabetes mellitus insulin-dependent, COPD, chronic pain syndrome presents to emergency department with chief  complaint of hyperglycemia.  Patient denies infectious symptoms. ER workup revealed hyponatremia at 2.7. Potassium repleted with by mouth and IV potassium initiated. Repeat level was 3.9 however there was mild hemolysis and pt had only received oral K. Doubt DKA as urine is without ketones. Anion gap is 12. Patient given IV fluid bolus.  0454: Patient feels better, she has eaten a meal, and she is requesting to go home. Vital signs are stable. Repeat BMP was obtained at 0558 prior to IV infusion of KCl.  Likely hemolysis.  IV K just started.  Plan for c/s internal medicine for admission for hypokalemia and continued obs.  Pt will need inpt r/o and further testing.     Darlys Gales, MD 09/06/13 0981  Darlys Gales, MD 09/06/13 [phone]

## 2013-09-07 DIAGNOSIS — I10 Essential (primary) hypertension: Secondary | ICD-10-CM

## 2013-09-07 DIAGNOSIS — N179 Acute kidney failure, unspecified: Secondary | ICD-10-CM

## 2013-09-07 LAB — CBC
HCT: 41.4 % (ref 36.0–46.0)
MCHC: 33.8 g/dL (ref 30.0–36.0)
MCV: 81.5 fL (ref 78.0–100.0)
Platelets: 151 10*3/uL (ref 150–400)
RBC: 5.08 MIL/uL (ref 3.87–5.11)
RDW: 17.8 % — ABNORMAL HIGH (ref 11.5–15.5)
WBC: 6.4 10*3/uL (ref 4.0–10.5)

## 2013-09-07 LAB — GLUCOSE, CAPILLARY
Glucose-Capillary: 175 mg/dL — ABNORMAL HIGH (ref 70–99)
Glucose-Capillary: 129 mg/dL — ABNORMAL HIGH (ref 70–99)

## 2013-09-07 LAB — BASIC METABOLIC PANEL
Calcium: 9.2 mg/dL (ref 8.4–10.5)
Chloride: 89 mEq/L — ABNORMAL LOW (ref 96–112)
Creatinine, Ser: 0.95 mg/dL (ref 0.50–1.10)
GFR calc Af Amer: 82 mL/min — ABNORMAL LOW (ref >90)

## 2013-09-07 MED ORDER — POTASSIUM CHLORIDE CRYS ER 20 MEQ PO TBCR: 40.0000 meq | EXTENDED_RELEASE_TABLET | Freq: Every day | ORAL | Status: DC

## 2013-09-07 MED ORDER — POTASSIUM CHLORIDE CRYS ER 20 MEQ PO TBCR
40.0000 meq | EXTENDED_RELEASE_TABLET | Freq: Three times a day (TID) | ORAL | Status: DC
  Administered 2013-09-07 (×2): 40 meq via ORAL
  Filled 2013-09-07 (×2): qty 2

## 2013-09-07 NOTE — Progress Notes (Signed)
D/C Tele, D/C IV, Pt. Discharge instructions reviewed with pt. And pt.'s family, D/C paperwork given to pt. Pt. Confirmed she had all of her belongings. Pt. Showed no signs of distress or discomfort, Pt. Left unit via wheelchair and was transported home via family members.

## 2013-09-07 NOTE — Progress Notes (Signed)
Patient refused tonight's dose of torsemide and aldactone. Will continue to monitor. Troy Sine

## 2013-09-07 NOTE — Discharge Summary (Signed)
Physician Discharge Summary  [name] ZOX:[id number] DOB: November 29, 1965 DOA: 09/06/2013  PCP: Rene Paci, MD  Admit date: 09/06/2013 Discharge date: 09/07/2013  Time spent: 35 minutes  Recommendations for Outpatient Follow-up:  1. Follow up with cardiologist in 1 week. Check a b-met at that time.  Discharge Diagnoses:  Active Problems:   Chronic respiratory failure   FIBROMYALGIA   Diabetes type 2, uncontrolled   Chronic diastolic CHF (congestive heart failure)   Hypokalemia   AKI (acute kidney injury)   Discharge Condition: stable  Diet recommendation: heart healthy  Filed Weights   09/06/13 0827 09/07/13 0535  Weight: 137.3 kg (302 lb 11.1 oz) 136.079 kg (300 lb)    History of present illness:  47 y.o. female with past medical history of diabetes mellitus type 2 end diastolic CHF came in to the hospital with a hyperglycemia and hypokalemia.  Potassium was 2.7 on admission, blood glucose was 239.    Hospital Course:  Hypokalemia  - Replace with oral supplements. This is likely secondary to oral diuretics (metolazone).  - Check potassium and magnesium.  - Increased potassium supplements on discharge.  - will need a B-met in 1 week.  AKI: - due to over diuresis. - Improved with Oral fluid hydration. - Cr at baseline. - B-met in 1 week.  Chronic respiratory failure  -Secondary to chronic diastolic CHF and COPD, on oxygen at home.  -Continue home oxygen, no changes.   Diabetes mellitus type 2, uncontrolled  -Slightly uncontrolled with hemoglobin A1c of 7.2  -Continue home medications, last note from PCP Shawn Lantus 30 twice a day.     Procedures:  noen (i.e. Studies not automatically included, echos, thoracentesis, etc; not x-rays)  Consultations:  none  Discharge Exam: Filed Vitals:   09/07/13 0535  BP: 109/55  Pulse: 88  Temp: 97.5 F (36.4 C)  Resp: 18    General: A&O x3 Cardiovascular: RRR Respiratory: good air  movement CTA B/L  Discharge Instructions      Discharge Orders   Future Appointments Provider Department Dept Phone   10/14/2013 1:30 PM Barbaraann Share, MD [facility] [phone]   10/15/2013 11:00 AM Newt Lukes, MD [facility] [phone]   Future Orders Complete By Expires   Diet - low sodium heart healthy  As directed    Increase activity slowly  As directed        Medication List    STOP taking these medications       levofloxacin 750 MG tablet  Commonly known as:  LEVAQUIN     metolazone 2.5 MG tablet  Commonly known as:  ZAROXOLYN      TAKE these medications       albuterol (2.5 MG/3ML) 0.083% nebulizer solution  Commonly known as:  PROVENTIL  Take 2.5 mg by nebulization every 6 (six) hours as needed for wheezing.     albuterol 108 (90 BASE) MCG/ACT inhaler  Commonly known as:  PROVENTIL HFA;VENTOLIN HFA  Inhale 2 puffs into the lungs every 6 (six) hours as needed for wheezing or shortness of breath.     ALPRAZolam 1 MG tablet  Commonly known as:  XANAX  Take 1 tablet by mouth 2 (two) times daily. Scheduled doses     aspirin EC 81 MG tablet  Take 1 tablet (81 mg total) by mouth daily.     budesonide-formoterol 160-4.5 MCG/ACT inhaler  Commonly known as:  SYMBICORT  Inhale 2 puffs into the lungs 2 (two)  times daily.     calcium carbonate 500 MG chewable tablet  Commonly known as:  TUMS  Chew 1 tablet (200 mg of elemental calcium total) by mouth 3 (three) times daily with meals.     cetirizine 1 MG/ML syrup  Commonly known as:  ZYRTEC  Take 10 mg by mouth at bedtime.     clonazePAM 1 MG tablet  Commonly known as:  KLONOPIN  Take 0.5-1 tablets (0.5-1 mg total) by mouth 2 (two) times daily as needed for anxiety.     esomeprazole 40 MG capsule  Commonly known as:  NEXIUM  Take 1 capsule (40 mg total) by mouth 2 (two) times daily.     fluconazole 150 MG tablet  Commonly known as:  DIFLUCAN  Take 1 tablet  (150 mg total) by mouth once. May repeat dose next day if needed     fluticasone 50 MCG/ACT nasal spray  Commonly known as:  FLONASE  Place 2 sprays into both nostrils daily as needed for rhinitis.     gabapentin 300 MG capsule  Commonly known as:  NEURONTIN  Take 1 capsule by mouth 2 (two) times daily.     glimepiride 1 MG tablet  Commonly known as:  AMARYL  Take 1 mg by mouth daily before breakfast.     insulin aspart 100 UNIT/ML injection  Commonly known as:  novoLOG  - Inject 0-10 Units into the skin 3 (three) times daily before meals. CBG less than 140: 0 units  - 141-200: 2 units  - 201-250: 4 units  - 251-300: 6 units  - 301-350: 8 units  - 351-400: 10 units  - 400+: call MD     insulin glargine 100 UNIT/ML injection  Commonly known as:  LANTUS  - Inject 20-40 Units into the skin See admin instructions. Takes 20 units every evening.  - Also takes 20 units in the morning only if sugar is over 200     Liraglutide 18 MG/3ML Sopn  Inject 0.6 mg into the skin daily.     Oxycodone HCl 10 MG Tabs  Take 1 tablet (10 mg total) by mouth 3 (three) times daily as needed (pain). May fill no more than once every 30 days.     potassium chloride SA 20 MEQ tablet  Commonly known as:  K-DUR,KLOR-CON  Take 2 tablets (40 mEq total) by mouth daily.     promethazine 25 MG tablet  Commonly known as:  PHENERGAN  TAKE 1 TABLET BY MOUTH EVERY 6 HOURS AS NEEDED FOR NAUSEA     ranitidine 150 MG tablet  Commonly known as:  ZANTAC  Take 1 tablet (150 mg total) by mouth 2 (two) times daily.     sertraline 100 MG tablet  Commonly known as:  ZOLOFT  Take 1 tablet (100 mg total) by mouth daily.     spironolactone 25 MG tablet  Commonly known as:  ALDACTONE  Take 1 tablet (25 mg total) by mouth 2 (two) times daily.     tiotropium 18 MCG inhalation capsule  Commonly known as:  SPIRIVA  Place 18 mcg into inhaler and inhale daily.     tiZANidine 4 MG tablet  Commonly known as:   ZANAFLEX  Take 4 mg by mouth every 8 (eight) hours as needed for muscle spasms.     torsemide 100 MG tablet  Commonly known as:  DEMADEX  Take 100 mg by mouth 2 (two) times daily.       Allergies  Allergen Reactions  . Simvastatin Other (See Comments)    Severe leg pain and burning  . Sulfamethoxazole-Trimethoprim Nausea And Vomiting    Causes Projectile Vomiting  . Zolpidem Tartrate Other (See Comments)    Chest pain  . Azithromycin Other (See Comments)    Resistant to med  . Doxycycline Other (See Comments)    Resistant to med  . Latex Itching and Rash    Also burning sensations  . Metformin And Related Diarrhea    Severe diarrhea  . Metronidazole Nausea And Vomiting  . Ciprofloxacin Nausea Only   Follow-up Information   Follow up with Rene Paci, MD.   Specialty:  Internal Medicine   Contact information:   [address] [place] [address] [phone]       Follow up In 2 weeks.      Follow up with Willa Rough, MD In 1 week. (hospital follow up)    Specialty:  Cardiology   Contact information:   [address] [place] [address] [phone]        The results of significant diagnostics from this hospitalization (including imaging, microbiology, ancillary and laboratory) are listed below for reference.    Significant Diagnostic Studies: Dg Chest 2 View  08/25/2013   CLINICAL DATA:  Chest pain.  EXAM: CHEST  2 VIEW  COMPARISON:  Chest x-ray 07/16/2013.  FINDINGS: The cardiac silhouette, mediastinal and hilar contours are stable. There are chronic lung changes with emphysema and pulmonary scarring. No infiltrates, edema or effusions. The bony thorax is intact.  IMPRESSION: Emphysematous changes and pulmonary scarring but no acute pulmonary findings.   Electronically Signed   By: Loralie Champagne M.D.   On: 08/25/2013 17:10    Microbiology: Recent Results (from the past 240 hour(s))  MRSA PCR SCREENING      Status: None   Collection Time    09/06/13 11:03 AM      Result Value Range Status   MRSA by PCR NEGATIVE  NEGATIVE Final   Comment:            The GeneXpert MRSA Assay (FDA     approved for NASAL specimens     only), is one component of a     comprehensive MRSA colonization     surveillance program. It is not     intended to diagnose MRSA     infection nor to guide or     monitor treatment for     MRSA infections.     Labs: Basic Metabolic Panel:  Recent Labs Lab 09/03/13 1648 09/06/13 0108 09/06/13 0450 09/06/13 0820 09/07/13 0625  NA 132* 132* 132* 133* 134*  K 3.4* 2.7* 3.9 3.2* 3.2*  CL 83* 84* 87* 90* 89*  CO2 36* 36* 33* 31 32  GLUCOSE 199* 204* 192* 144* 199*  BUN 29* 29* 28* 23 24*  CREATININE 1.5* 1.14* 0.99 0.83 0.95  CALCIUM 9.8 9.9 9.3 9.2 9.2  MG  --   --   --   --  1.8   Liver Function Tests: No results found for this basename: AST, ALT, ALKPHOS, BILITOT, PROT, ALBUMIN,  in the last 168 hours No results found for this basename: LIPASE, AMYLASE,  in the last 168 hours No results found for this basename: AMMONIA,  in the last 168 hours CBC:  Recent Labs Lab 09/07/13 0625  WBC 6.4  HGB 14.0  HCT 41.4  MCV 81.5  PLT 151  Cardiac Enzymes: No results found for this basename: CKTOTAL, CKMB, CKMBINDEX, TROPONINI,  in the last 168 hours BNP: BNP (last 3 results)  Recent Labs  06/24/13 1617 08/03/13 0046 08/25/13 1509  PROBNP 61.2 2051.0* 9.6   CBG:  Recent Labs Lab 09/06/13 0558 09/06/13 0701 09/06/13 1649 09/06/13 2102 09/07/13 0612  GLUCAP 191* 180* 173* 198* 175*       Signed:  Marinda Elk  [facility] 09/07/2013, 10:46 AM

## 2013-09-08 ENCOUNTER — Telehealth: Payer: Self-pay | Admitting: *Deleted

## 2013-09-08 NOTE — Telephone Encounter (Signed)
[facility] Triage Call Report Triage Record Num: [id number] Operator: Alphonsa Overall Patient Name: [name] Call Date & Time: 09/05/2013 11:04:07PM Patient Phone: [phone] PCP: Rene Paci Patient Gender: Female PCP Fax : [phone] Patient DOB: [birth year] Practice Name: [facility] Reason for Call: Caller: [name]/Patient; PCP: Rene Paci (Adults only); CB#: [phone]; Call regarding Elevated blood sugar. Began new medication for diabetes, Victoza 09/03/13. Dry mouth, sleeping all day, off balance, frequent urination, 09/05/13. 316 glucose at 2250. Took Novalog 8units and Lantis 20units at 2253. 283 at 2315. 187 09/05/13 am, 199 lunch. Signs and symptoms of ketoacidosis, pt seems confused and is alone. Possibly dehydrated. Pt advised to go to ED now. Care advice given per Diabetes Control Protocol. Pt will go to [facility]. Protocol(s) Used: Diabetes: Control Problems Recommended Outcome per Protocol: See ED Immediately Reason for Outcome: Signs of dehydration Care Advice: ~ Protect the patient from falling or other harm. ~ Another adult should drive. ~ If available, bring recent log of blood sugars or bring blood glucose monitor with log of blood sugars. Dehydration can affect blood sugar levels. Drink water during transport and while waiting to see a provider. If vomiting, take sips of water or suck on ice chips. ~ ~ IMMEDIATE ACTION Write down provider's name. List or place the following in a bag for transport with the patient: current prescription and/or nonprescription medications; alternative treatments, therapies and medications; and street drugs. ~ 12/

## 2013-09-10 ENCOUNTER — Encounter: Payer: Self-pay | Admitting: Internal Medicine

## 2013-09-11 ENCOUNTER — Encounter: Payer: Self-pay | Admitting: Internal Medicine

## 2013-09-11 ENCOUNTER — Other Ambulatory Visit: Payer: Self-pay | Admitting: Internal Medicine

## 2013-09-11 NOTE — Telephone Encounter (Signed)
Please print so i may sign -then fax

## 2013-09-12 MED ORDER — ALPRAZOLAM 1 MG PO TABS: 1.0000 mg | ORAL_TABLET | Freq: Three times a day (TID) | ORAL | Status: DC

## 2013-09-12 NOTE — Telephone Encounter (Signed)
Faxed script back to [facility].../lmb 

## 2013-09-15 ENCOUNTER — Other Ambulatory Visit: Payer: Self-pay | Admitting: Internal Medicine

## 2013-09-15 ENCOUNTER — Encounter: Payer: Self-pay | Admitting: Internal Medicine

## 2013-09-15 MED ORDER — PREDNISONE (PAK) 10 MG PO TABS: ORAL_TABLET | ORAL | Status: DC

## 2013-09-16 ENCOUNTER — Encounter: Payer: Self-pay | Admitting: Internal Medicine

## 2013-09-17 ENCOUNTER — Other Ambulatory Visit: Payer: Self-pay | Admitting: *Deleted

## 2013-09-17 MED ORDER — INSULIN GLARGINE 100 UNIT/ML SOLOSTAR PEN: PEN_INJECTOR | SUBCUTANEOUS | Status: DC

## 2013-09-23 ENCOUNTER — Telehealth: Payer: Self-pay | Admitting: Internal Medicine

## 2013-09-23 NOTE — Telephone Encounter (Signed)
Patient Information:  Caller Name: Marrianne  Phone: [phone]  Patient: [name], [name]  Gender: Female  DOB: August 28, 1966  Age: 47 Years  PCP: Rene Paci (Adults only)  Pregnant: No  Office Follow Up:  Does the office need to follow up with this patient?: Yes  Instructions For The Office: This is your heads up that pt is going to [facility].  RN Note:  Appointment scheduled for 10/01/2013 at 10:00 with Dr. Felicity Coyer.  Pt refused appointment with the other physicians in practice and will go to [facility].  Symptoms  Reason For Call & Symptoms: Onset 09/22/2013 of back issues, c/o lower back pain, rates 10/10.  Reviewed Health History In EMR: Yes  Reviewed Medications In EMR: Yes  Reviewed Allergies In EMR: Yes  Reviewed Surgeries / Procedures: Yes  Date of Onset of Symptoms: 09/22/2013  Treatments Tried: Oxycodone 10 mg at 14:00, Aleve 2 tabs at 10:00, heat (hot water bottle) at 14:30, and MaxFreeze topical at 14:45  Treatments Tried Worked: No OB / GYN:  LMP: Unknown  Guideline(s) Used:  Back Pain  Disposition Per Guideline:   Go to Office Now  Reason For Disposition Reached:   Severe back pain  Advice Given:  Call Back If:  Numbness or weakness occur  Bowel/bladder problems occur  Pain lasts for more than 2 weeks  You become worse.  Patient Refused Recommendation:  [facility]  Appointment scheduled for 10/01/2013 at 10:00 with Dr. Felicity Coyer.  Pt refused appointment with the other physicians in practice and will go to [facility].

## 2013-09-29 ENCOUNTER — Ambulatory Visit: Payer: Self-pay | Admitting: Internal Medicine

## 2013-10-01 ENCOUNTER — Other Ambulatory Visit: Payer: Self-pay | Admitting: Internal Medicine

## 2013-10-01 ENCOUNTER — Ambulatory Visit: Payer: Medicaid Other | Admitting: Internal Medicine

## 2013-10-01 NOTE — Telephone Encounter (Signed)
Medication never prescribed--in historical

## 2013-10-02 ENCOUNTER — Other Ambulatory Visit: Payer: Self-pay | Admitting: *Deleted

## 2013-10-02 MED ORDER — ESOMEPRAZOLE MAGNESIUM 40 MG PO CPDR: 40.0000 mg | DELAYED_RELEASE_CAPSULE | Freq: Two times a day (BID) | ORAL | Status: DC

## 2013-10-06 ENCOUNTER — Ambulatory Visit (INDEPENDENT_AMBULATORY_CARE_PROVIDER_SITE_OTHER): Payer: PRIVATE HEALTH INSURANCE | Admitting: Internal Medicine

## 2013-10-06 ENCOUNTER — Telehealth: Payer: Self-pay | Admitting: Internal Medicine

## 2013-10-06 ENCOUNTER — Encounter: Payer: Self-pay | Admitting: Internal Medicine

## 2013-10-06 VITALS — BP 120/78 | HR 95 | Temp 98.7°F | Wt 301.4 lb

## 2013-10-06 DIAGNOSIS — M545 Low back pain, unspecified: Secondary | ICD-10-CM

## 2013-10-06 DIAGNOSIS — M5416 Radiculopathy, lumbar region: Secondary | ICD-10-CM

## 2013-10-06 DIAGNOSIS — F32A Depression, unspecified: Secondary | ICD-10-CM

## 2013-10-06 DIAGNOSIS — H109 Unspecified conjunctivitis: Secondary | ICD-10-CM

## 2013-10-06 DIAGNOSIS — F329 Major depressive disorder, single episode, unspecified: Secondary | ICD-10-CM

## 2013-10-06 DIAGNOSIS — IMO0002 Reserved for concepts with insufficient information to code with codable children: Secondary | ICD-10-CM

## 2013-10-06 DIAGNOSIS — F3289 Other specified depressive episodes: Secondary | ICD-10-CM

## 2013-10-06 MED ORDER — PREDNISONE (PAK) 10 MG PO TABS: ORAL_TABLET | ORAL | Status: DC

## 2013-10-06 MED ORDER — OXYCODONE HCL 10 MG PO TABS: 10.0000 mg | ORAL_TABLET | Freq: Three times a day (TID) | ORAL | Status: DC | PRN

## 2013-10-06 MED ORDER — POLYMYXIN B-TRIMETHOPRIM 10000-0.1 UNIT/ML-% OP SOLN: 1.0000 [drp] | OPHTHALMIC | Status: DC

## 2013-10-06 MED ORDER — PROMETHAZINE HCL 25 MG PO TABS: ORAL_TABLET | ORAL | Status: DC

## 2013-10-06 NOTE — Patient Instructions (Addendum)
It was good to see you today.  We have reviewed your prior records including labs and tests today  Test(s) ordered today (MRI of back to evaluate pain symptoms) - Your results will be released to MyChart (or called to you) after review, usually within 72hours after test completion. If any changes need to be made, you will be notified at that same time.  Medications reviewed and updated - use antibiotic eye drops to right eye as instructed - call you eye specialist if unimproved in next 7-10 days, sooner if worse or vision changes No other prescription changes recommended at this time.  Your prescription(s) have been submitted to your pharmacy. Please take as directed and contact our office if you believe you are having problem(s) with the medication(s).  followup in 3 months for diabetes check and general review, please call sooner if problems  Bacterial Conjunctivitis Bacterial conjunctivitis (commonly called pink eye) is redness, soreness, or puffiness (inflammation) of the white part of your eye. It is caused by a germ called bacteria. These germs can easily spread from person to person (contagious). Your eye often will become red or pink. Your eye may also become irritated, watery, or have a thick discharge.  HOME CARE   Apply a cool, clean washcloth over closed eyelids. Do this for 10 20 minutes, 3 4 times a day while you have pain.  Gently wipe away any fluid coming from the eye with a warm, wet washcloth or cotton ball.  Wash your hands often with soap and water. Use paper towels to dry your hands.  Do not share towels or washcloths.  Change or wash your pillowcase every day.  Do not use eye makeup until the infection is gone.  Do not use machines or drive if your vision is blurry.  Stop using contact lenses. Do not use them again until your doctor says it is okay.  Do not touch the tip of the eye drop bottle or medicine tube with your fingers when you put medicine on the  eye. GET HELP RIGHT AWAY IF:   Your eye is not better after 3 days of starting your medicine.  You have a yellowish fluid coming out of the eye.  You have more pain in the eye.  Your eye redness is spreading.  Your vision becomes blurry.  You have a fever or lasting symptoms for more than 2-3 days.  You have a fever and your symptoms suddenly get worse.  You have pain in the face.  Your face gets red or puffy (swollen). MAKE SURE YOU:   Understand these instructions.  Will watch this condition.  Will get help right away if you are not doing well or get worse. Document Released: 06/20/2008 Document Revised: 08/28/2012 Document Reviewed: 06/20/2008 [facility] Patient Information 2014 [place].  Back Exercises Back exercises help treat and prevent back injuries. The goal is to increase your strength in your belly (abdominal) and back muscles. These exercises can also help with flexibility. Start these exercises when told by your doctor. HOME CARE Back exercises include: Pelvic Tilt.  Lie on your back with your knees bent. Tilt your pelvis until the lower part of your back is against the floor. Hold this position 5 to 10 sec. Repeat this exercise 5 to 10 times. Knee to Chest.  Pull 1 knee up against your chest and hold for 20 to 30 seconds. Repeat this with the other knee. This may be done with the other leg straight or bent,  whichever feels better. Then, pull both knees up against your chest. Sit-Ups or Curl-Ups.  Bend your knees 90 degrees. Start with tilting your pelvis, and do a partial, slow sit-up. Only lift your upper half 30 to 45 degrees off the floor. Take at least 2 to 3 seonds for each sit-up. Do not do sit-ups with your knees out straight. If partial sit-ups are difficult, simply do the above but with only tightening your belly (abdominal) muscles and holding it as told. Hip-Lift.  Lie on your back with your knees flexed 90 degrees. Push down with your feet  and shoulders as you raise your hips 2 inches off the floor. Hold for 10 seconds, repeat 5 to 10 times. Back Arches.  Lie on your stomach. Prop yourself up on bent elbows. Slowly press on your hands, causing an arch in your low back. Repeat 3 to 5 times. Shoulder-Lifts.  Lie face down with arms beside your body. Keep hips and belly pressed to floor as you slowly lift your head and shoulders off the floor. Do not overdo your exercises. Be careful in the beginning. Exercises may cause you some mild back discomfort. If the pain lasts for more than 15 minutes, stop the exercises until you see your doctor. Improvement with exercise for back problems is slow.  Document Released: 10/14/2010 Document Revised: 12/04/2011 Document Reviewed: 07/13/2011 [facility] Patient Information 2014 [place], [place].

## 2013-10-06 NOTE — Progress Notes (Signed)
Subjective:    [name], female    DOB: 20-May-1966, 48 y.o.   MRN: [id number]  Back Pain Pertinent negatives include no chest pain or fever.    Here for concerns of recurrent back pain and pinkeye x10 days, right side only see PA student note for more details  Past Medical History  Diagnosis Date  . Hypertension   . Hyperlipidemia   . Chronic headache   . Fibromyalgia     daily narcotics  . Anxiety     hx chronic BZ use, stopped 07/2010  . Anemia   . Pulmonary sarcoidosis     unimpressive CT chest 2011  . Colonic polyp   . GERD (gastroesophageal reflux disease)   . ALLERGIC RHINITIS   . Asthma   . CHF (congestive heart failure)     Diastolic with fluid overload, May, 2012, LVEF 60%  . Morbid obesity   . Depression   . Panic attacks   . Diabetes mellitus   . COPD (chronic obstructive pulmonary disease)     on home O2, moderate airflow obstruction, suspect d/t emphysema  . Obesity   . Elevated LFTs 09/2011  . Ovarian cyst   . Chronic back pain   . Sleep apnea      CPAP     Review of Systems  Constitutional: Negative for fever and fatigue.  Eyes: Positive for discharge, redness (right x 10d) and itching. Negative for visual disturbance.  Respiratory: Positive for cough and shortness of breath (chronic). Negative for chest tightness and wheezing.   Cardiovascular: Negative for chest pain and leg swelling.  Musculoskeletal: Positive for back pain (radiating into R>L LE, exac by fall 08/2013).       Objective:   Physical Exam BP 120/78  Pulse 95  Temp(Src) 98.7 F (37.1 C) (Oral)  Wt 301 lb 6.4 oz (136.714 kg)  SpO2 90%  LMP 10/16/2012 Wt Readings from Last 3 Encounters:  10/06/13 301 lb 6.4 oz (136.714 kg)  09/07/13 300 lb (136.079 kg)  09/03/13 301 lb 3.2 oz (136.623 kg)   Constitutional: She is obese, on [place] O2, but appears well-developed and well-nourished. No distress.  Eyes: Mild erythema R consistent with conjunctivitis.  L conjunctivae and EOM are normal. Pupils are equal, round, and reactive to light. No scleral icterus.  Neck: Normal range of motion. Neck supple. No JVD present. No thyromegaly present.  Cardiovascular: Normal rate, regular rhythm and normal heart sounds.  No murmur heard. No BLE edema. Pulmonary/Chest: Effort normal and breath sounds normal. No respiratory distress. She has no wheezes.  Musculoskeletal: Back: full range of motion of thoracic and lumbar spine. Non tender to palpation. Negative straight leg raise. DTR's are symmetrically intact. Sensation intact in all dermatomes of the lower extremities. Full strength to manual muscle testing. patient is able to heel toe walk without difficulty and ambulates with antalgic gait. Neurological: She is alert and oriented to person, place, and time. No cranial nerve deficit. Coordination, balance, strength, speech and gait are normal.  Skin: Skin is warm and dry. No rash noted. No erythema.  Psychiatric: She has a normal mood and affect. Her behavior is normal. Judgment and thought content normal.   Lab Results  Component Value Date   WBC 6.4 09/07/2013   HGB 14.0 09/07/2013   HCT 41.4 09/07/2013   PLT 151 09/07/2013   GLUCOSE 199* 09/07/2013   CHOL 304* 09/03/2013   TRIG 340.0* 09/03/2013   HDL 49.50 09/03/2013  LDLDIRECT 204.3 09/03/2013   LDLCALC  Value: 92        Total Cholesterol/HDL:CHD Risk Coronary Heart Disease Risk Table                     Men   Women  1/2 Average Risk   3.4   3.3  Average Risk       5.0   4.4  2 X Average Risk   9.6   7.1  3 X Average Risk  23.4   11.0        Use the calculated Patient Ratio above and the CHD Risk Table to determine the patient's CHD Risk.        ATP III CLASSIFICATION (LDL):  <100     mg/dL   Optimal  100-129  mg/dL   Near or Above                    Optimal  130-159  mg/dL   Borderline  160-189  mg/dL   High  >190     mg/dL   Very High 03/07/2010   ALT 35 08/25/2013   AST 20 08/25/2013   NA 134*  09/07/2013   K 3.2* 09/07/2013   CL 89* 09/07/2013   CREATININE 0.95 09/07/2013   BUN 24* 09/07/2013   CO2 32 09/07/2013   TSH 0.701 04/22/2012   INR 0.96 08/03/2013   HGBA1C 7.3* 09/06/2013   MICROALBUR 0.5 09/03/2013       Assessment & Plan:   Right eye conjunctivitis, symptoms ongoing greater than 10 days. Will treat for presumed bacterial etiology with Polytrim. Prescription provided. Patient will call her ophthalmology specialist if unimproved in next 7-10 days, sooner if worse  Also see problem list. Medications and labs reviewed today.

## 2013-10-06 NOTE — Assessment & Plan Note (Signed)
ER visit for RLE numbness and pain 01/2011 and again 06/2011> MRI with free fragment and encroachment on L4 root -  status post R L4-5 microdiscectomy 07/06/2011 > symptoms improved but not resolved Repeat neurosurgical evaluation 2013 and February 2014, patient declined repeat surgical intervention at that time Then eval by ortho spine - tooke - recommended ESI, but pt declined Exacerbated by accidental fall December 2014 - neuro exam appears benign, but persisting intermittent severe pain Will recheck MRI to evaluate for new injury Pred Pak as needed for anti-inflammatory, continuous relaxant continue oxy as needed - refills today- reviewed chronic pain mgmt as ongoing, no increase in narcotic dose offer today

## 2013-10-06 NOTE — Progress Notes (Signed)
Subjective:    [name], female    DOB: 03/03/1966, 48 y.o.   MRN: [id number] Chief Complaint  Patient presents with  . Back Pain    Severe (L) back pain & leg pains    Back Pain This is a chronic problem. The current episode started 1 to 4 weeks ago. The problem occurs 2 to 4 times per day. The problem is unchanged. The pain is present in the lumbar spine. The quality of the pain is described as aching. The pain does not radiate. The pain is at a severity of 10/10. The pain is severe. The pain is the same all the time. Stiffness is present all day. Associated symptoms include leg pain and numbness. Pertinent negatives include no abdominal pain, bladder incontinence, bowel incontinence, chest pain, fever, headaches, paresis, paresthesias, pelvic pain, perianal numbness, tingling or weakness. Risk factors include recent trauma, lack of exercise, poor posture, sedentary lifestyle and obesity. She has tried NSAIDs, muscle relaxant, heat, bed rest and analgesics for the symptoms. The treatment provided mild relief.  Conjunctivitis  The current episode started more than 1 week ago. The onset is undetermined. The problem has been unchanged. The problem is mild. Nothing relieves the symptoms. Nothing aggravates the symptoms. Associated symptoms include eye itching, eye discharge and eye redness. Pertinent negatives include no fever, no decreased vision, no double vision, no photophobia, no abdominal pain, no congestion, no ear discharge, no headaches, no rhinorrhea, no sore throat, no stridor and no eye pain. The eye pain is mild. The right eye is affected.The eye pain is not associated with movement. The eyelid exhibits no abnormality.   Past Medical History  Diagnosis Date  . Hypertension   . Hyperlipidemia   . Chronic headache   . Fibromyalgia     daily narcotics  . Anxiety     hx chronic BZ use, stopped 07/2010  . Anemia   . Pulmonary sarcoidosis     unimpressive  CT chest 2011  . Colonic polyp   . GERD (gastroesophageal reflux disease)   . ALLERGIC RHINITIS   . Asthma   . CHF (congestive heart failure)     Diastolic with fluid overload, May, 2012, LVEF 60%  . Morbid obesity   . Depression   . Panic attacks   . Diabetes mellitus   . COPD (chronic obstructive pulmonary disease)     on home O2, moderate airflow obstruction, suspect d/t emphysema  . Obesity   . Elevated LFTs 09/2011  . Ovarian cyst   . Chronic back pain   . Sleep apnea      CPAP   History   Social History  . Marital Status: Legally Separated    Spouse Name: N/A    Number of Children: N/A  . Years of Education: N/A   Occupational History  . Not on file.   Social History Main Topics  . Smoking status: Former Smoker -- 0.25 packs/day for 29 years    Types: Cigarettes    Quit date: 06/24/2013  . Smokeless tobacco: Never Used     Comment: Resumed smoking September 2013 --2-3 CIGS DAILY AND E-CIG  . Alcohol Use: Yes     Comment: occasionally  . Drug Use: Yes    Special: Cocaine, Marijuana  . Sexual Activity: Not on file   Other Topics Concern  . Not on file   Social History Narrative   Pt is single. No children, and disaability   Family History  Problem  Relation Age of Onset  . Hypertension Mother   . Emphysema Father   . Hypertension Father   . Stomach cancer Father   . Allergies Brother   . Hypertension Brother   . Stomach cancer Brother   . Heart disease Mother   . Heart disease Father   . Heart disease Brother     died age 5 sudden death/MI   Current Outpatient Prescriptions on File Prior to Visit  Medication Sig Dispense Refill  . albuterol (PROVENTIL HFA;VENTOLIN HFA) 108 (90 BASE) MCG/ACT inhaler Inhale 2 puffs into the lungs every 6 (six) hours as needed for wheezing or shortness of breath.      Marland Kitchen albuterol (PROVENTIL) (2.5 MG/3ML) 0.083% nebulizer solution Take 2.5 mg by nebulization every 6 (six) hours as needed for wheezing.      Marland Kitchen  ALPRAZolam (XANAX) 1 MG tablet Take 1 tablet (1 mg total) by mouth 3 (three) times daily. Scheduled doses  90 tablet  1  . aspirin EC 81 MG tablet Take 1 tablet (81 mg total) by mouth daily.  150 tablet  2  . budesonide-formoterol (SYMBICORT) 160-4.5 MCG/ACT inhaler Inhale 2 puffs into the lungs 2 (two) times daily.  1 Inhaler  2  . calcium carbonate (TUMS) 500 MG chewable tablet Chew 1 tablet (200 mg of elemental calcium total) by mouth 3 (three) times daily with meals.      . cetirizine (ZYRTEC) 1 MG/ML syrup TAKE 2 TEASPOONFUL BY MOUTH AT BEDTIME  300 mL  0  . esomeprazole (NEXIUM) 40 MG capsule Take 1 capsule (40 mg total) by mouth 2 (two) times daily.  60 capsule  5  . fluconazole (DIFLUCAN) 150 MG tablet Take 1 tablet (150 mg total) by mouth once. May repeat dose next day if needed  2 tablet  0  . fluticasone (FLONASE) 50 MCG/ACT nasal spray Place 2 sprays into both nostrils daily as needed for rhinitis.  16 g  2  . gabapentin (NEURONTIN) 300 MG capsule Take 1 capsule by mouth 2 (two) times daily.      Marland Kitchen glimepiride (AMARYL) 1 MG tablet Take 1 mg by mouth daily before breakfast.      . insulin aspart (NOVOLOG) 100 UNIT/ML injection Inject 0-10 Units into the skin 3 (three) times daily before meals. CBG less than 140: 0 units 141-200: 2 units 201-250: 4 units 251-300: 6 units 301-350: 8 units 351-400: 10 units 400+: call MD      . insulin glargine (LANTUS) 100 UNIT/ML injection Inject 20-40 Units into the skin See admin instructions. Takes 20 units every evening. Also takes 20 units in the morning only if sugar is over 200      . Liraglutide 18 MG/3ML SOPN Inject 0.6 mg into the skin daily.  3 mL  0  . potassium chloride SA (K-DUR,KLOR-CON) 20 MEQ tablet Take 2 tablets (40 mEq total) by mouth daily.  30 tablet  3  . ranitidine (ZANTAC) 150 MG tablet Take 1 tablet (150 mg total) by mouth 2 (two) times daily.      . sertraline (ZOLOFT) 100 MG tablet Take 1 tablet (100 mg total) by mouth  daily.  30 tablet  3  . sertraline (ZOLOFT) 50 MG tablet TAKE 1 TABLET BY MOUTH DAILY  30 tablet  5  . spironolactone (ALDACTONE) 25 MG tablet Take 1 tablet (25 mg total) by mouth 2 (two) times daily.  60 tablet  5  . tiotropium (SPIRIVA) 18 MCG inhalation capsule Place 18  mcg into inhaler and inhale daily.      Marland Kitchen tiZANidine (ZANAFLEX) 4 MG tablet Take 4 mg by mouth every 8 (eight) hours as needed for muscle spasms.      Marland Kitchen torsemide (DEMADEX) 100 MG tablet Take 100 mg by mouth 2 (two) times daily.       No current facility-administered medications on file prior to visit.      Review of Systems  Constitutional: Negative for fever.  HENT: Negative for congestion, ear discharge, rhinorrhea and sore throat.   Eyes: Positive for discharge, redness and itching. Negative for double vision, photophobia and pain.  Respiratory: Negative for stridor.   Cardiovascular: Negative for chest pain.  Gastrointestinal: Negative for abdominal pain and bowel incontinence.  Genitourinary: Negative for bladder incontinence and pelvic pain.  Musculoskeletal: Positive for back pain.  Neurological: Positive for numbness. Negative for tingling, weakness, headaches and paresthesias.       Objective:   Physical Exam  Constitutional: She is oriented to person, place, and time. She appears well-developed and well-nourished. No distress.  HENT:  Head: Normocephalic and atraumatic.  Eyes: EOM are normal. Pupils are equal, round, and reactive to light. Right eye exhibits discharge. Left eye exhibits no discharge. Right conjunctiva is injected. Right conjunctiva has no hemorrhage. Left conjunctiva is not injected. Left conjunctiva has no hemorrhage. Right eye exhibits normal extraocular motion. Left eye exhibits normal extraocular motion.  Eye discharge and irritation  For the past 10x days.   Cardiovascular: Normal rate, regular rhythm and normal heart sounds.   Pulmonary/Chest: Effort normal. No accessory muscle  usage. No respiratory distress. She has decreased breath sounds. She has no wheezes. She has no rales.  Musculoskeletal:       Right ankle: Normal.       Left ankle: Normal.  Neurological: She is alert and oriented to person, place, and time. She has normal strength. No cranial nerve deficit.  Numbness sensation on the medial side of the right leg from the ankle up to the knee. (New complaint)  Skin: She is not diaphoretic.  Psychiatric: She has a normal mood and affect. Her behavior is normal. Judgment and thought content normal.   Filed Vitals:   10/06/13 1319  BP: 120/78  Pulse: 95  Temp: 98.7 F (37.1 C)  TempSrc: Oral  Weight: 301 lb 6.4 oz (136.714 kg)  SpO2: 90%   Wt Readings from Last 3 Encounters:  10/06/13 301 lb 6.4 oz (136.714 kg)  09/07/13 300 lb (136.079 kg)  09/03/13 301 lb 3.2 oz (136.623 kg)   BMET    Component Value Date/Time   NA 134* 09/07/2013 0625   K 3.2* 09/07/2013 0625   CL 89* 09/07/2013 0625   CO2 32 09/07/2013 0625   GLUCOSE 199* 09/07/2013 0625   BUN 24* 09/07/2013 0625   CREATININE 0.95 09/07/2013 0625   CALCIUM 9.2 09/07/2013 0625   GFRNONAA 71* 09/07/2013 0625   GFRAA 82* 09/07/2013 0625           Assessment & Plan:  Back pain - possible Compression fracture  Due to risk factors (recent fall and freq pred use).  Ordered open MRI. Patient was instructed on effective measures to control pain such as cold/heat and the appropriate use of narcotic pain medication.  Prescribed prednisone taper pack as antiinflammatory to help with the pain.  Pending results from MRI patient will be referred to a back pain specialist for further evualation and management.  Patient was instructed to notify the  office if pain symptoms worsen or new symptoms emerge such as weakness, loss of muscle control or any other new symptoms.   Patient will be notified of pending MRI results and a follow up was scheduled for 12/03/13  Conjunctivitis-  Onset 10x day.   trimethoprim-polymyxin b (POLYTRIM) ophthalmic solution. Patient was instructed on application of antibiotic.  Was instructed if symptoms worsen or due not resolved within the antibiotics treatment to notify the office.     Orlie Dakin, PA-S  I have personally reviewed this case with PA student. I also personally examined this patient. I agree with history and findings as documented above. I reviewed, discussed and approve of the assessment and plan as listed above. Gwendolyn Grant, MD

## 2013-10-06 NOTE — Assessment & Plan Note (Signed)
Chronic BZ - remotely klonopin but ongoing scheduled xanax Also overlap with anxiety symptoms Exacerbated by resp faillure and air hunger Also exacerbated by freq/high dose steroids rx'd sertraline 08/2011 - increased dose to 100 mg daily in 05/2013 and changed benzodiazepine: Xanax back to previous Klonopin -  Extensive support offered today, denies SI/HI

## 2013-10-06 NOTE — Telephone Encounter (Signed)
Pt came for appt gave rx for oxycodone...[name]

## 2013-10-06 NOTE — Progress Notes (Signed)
Pre-visit discussion using our clinic review tool. No additional management support is needed unless otherwise documented below in the visit note.  

## 2013-10-08 ENCOUNTER — Encounter: Payer: Self-pay | Admitting: Internal Medicine

## 2013-10-08 MED ORDER — HYDROXYZINE HCL 10 MG PO TABS: 10.0000 mg | ORAL_TABLET | Freq: Three times a day (TID) | ORAL | Status: DC | PRN

## 2013-10-09 ENCOUNTER — Encounter: Payer: Self-pay | Admitting: Internal Medicine

## 2013-10-10 ENCOUNTER — Other Ambulatory Visit: Payer: Self-pay | Admitting: Internal Medicine

## 2013-10-14 ENCOUNTER — Ambulatory Visit: Payer: Self-pay | Admitting: Pulmonary Disease

## 2013-10-14 DIAGNOSIS — Z0279 Encounter for issue of other medical certificate: Secondary | ICD-10-CM

## 2013-10-15 ENCOUNTER — Ambulatory Visit: Payer: Self-pay | Admitting: Internal Medicine

## 2013-10-16 ENCOUNTER — Encounter: Payer: Self-pay | Admitting: Internal Medicine

## 2013-10-17 ENCOUNTER — Encounter: Payer: Self-pay | Admitting: Pulmonary Disease

## 2013-10-17 ENCOUNTER — Other Ambulatory Visit: Payer: Self-pay | Admitting: Internal Medicine

## 2013-10-17 NOTE — Telephone Encounter (Signed)
Okay for refill in Dr Katheren Puller absence?

## 2013-10-21 ENCOUNTER — Other Ambulatory Visit: Payer: Self-pay | Admitting: Internal Medicine

## 2013-10-23 ENCOUNTER — Telehealth: Payer: Self-pay | Admitting: Pulmonary Disease

## 2013-10-23 DIAGNOSIS — G4733 Obstructive sleep apnea (adult) (pediatric): Secondary | ICD-10-CM

## 2013-10-23 NOTE — Telephone Encounter (Signed)
Spoke with pt and she is wanting to get Bipap supplies from [facility] now.  Her oxygen is through them and would like Bipap supplies to come from them also instead of [facility].  Pt has a Bipap S9.  Please advise if ok to send order to [facility] for [place] supplies.

## 2013-10-24 NOTE — Telephone Encounter (Signed)
Ok with me 

## 2013-10-24 NOTE — Telephone Encounter (Signed)
I have placed referral to [facility]. Nothing further needed

## 2013-10-27 ENCOUNTER — Encounter: Payer: Self-pay | Admitting: Internal Medicine

## 2013-10-27 ENCOUNTER — Ambulatory Visit
Admission: RE | Admit: 2013-10-27 | Discharge: 2013-10-27 | Disposition: A | Discharge status: Home / Self Care | Payer: Medicaid Other | Source: Ambulatory Visit | Attending: Internal Medicine | Admitting: Internal Medicine

## 2013-10-27 DIAGNOSIS — M545 Low back pain, unspecified: Secondary | ICD-10-CM

## 2013-10-27 NOTE — Addendum Note (Signed)
Addended by: Gwendolyn Grant A on: 10/27/2013 05:11 PM   Modules accepted: Orders

## 2013-10-30 ENCOUNTER — Other Ambulatory Visit: Payer: Self-pay | Admitting: Internal Medicine

## 2013-11-03 ENCOUNTER — Other Ambulatory Visit: Payer: Self-pay | Admitting: Internal Medicine

## 2013-11-03 ENCOUNTER — Encounter: Payer: Self-pay | Admitting: Internal Medicine

## 2013-11-04 ENCOUNTER — Other Ambulatory Visit: Payer: Self-pay | Admitting: Internal Medicine

## 2013-11-04 MED ORDER — CETIRIZINE HCL 1 MG/ML PO SYRP: ORAL_SOLUTION | ORAL | Status: DC

## 2013-11-04 MED ORDER — OXYCODONE HCL 10 MG PO TABS: 10.0000 mg | ORAL_TABLET | Freq: Three times a day (TID) | ORAL | Status: DC | PRN

## 2013-11-04 NOTE — Telephone Encounter (Signed)
Faxed bck to [facility]...Johny Chess

## 2013-11-05 ENCOUNTER — Other Ambulatory Visit: Payer: Self-pay | Admitting: Pulmonary Disease

## 2013-11-06 ENCOUNTER — Ambulatory Visit: Payer: Self-pay | Admitting: Internal Medicine

## 2013-11-09 ENCOUNTER — Other Ambulatory Visit: Payer: Self-pay | Admitting: Internal Medicine

## 2013-11-13 ENCOUNTER — Encounter: Payer: Self-pay | Admitting: Internal Medicine

## 2013-11-16 ENCOUNTER — Encounter (HOSPITAL_COMMUNITY): Payer: Self-pay | Admitting: Emergency Medicine

## 2013-11-16 ENCOUNTER — Emergency Department (HOSPITAL_COMMUNITY)
Admission: EM | Admit: 2013-11-16 | Discharge: 2013-11-17 | Disposition: A | Discharge status: Home / Self Care | Payer: PRIVATE HEALTH INSURANCE | Attending: Emergency Medicine | Admitting: Emergency Medicine

## 2013-11-16 DIAGNOSIS — F329 Major depressive disorder, single episode, unspecified: Secondary | ICD-10-CM | POA: Insufficient documentation

## 2013-11-16 DIAGNOSIS — J449 Chronic obstructive pulmonary disease, unspecified: Secondary | ICD-10-CM

## 2013-11-16 DIAGNOSIS — E119 Type 2 diabetes mellitus without complications: Secondary | ICD-10-CM | POA: Insufficient documentation

## 2013-11-16 DIAGNOSIS — Z8742 Personal history of other diseases of the female genital tract: Secondary | ICD-10-CM | POA: Insufficient documentation

## 2013-11-16 DIAGNOSIS — IMO0001 Reserved for inherently not codable concepts without codable children: Secondary | ICD-10-CM | POA: Insufficient documentation

## 2013-11-16 DIAGNOSIS — J45901 Unspecified asthma with (acute) exacerbation: Principal | ICD-10-CM

## 2013-11-16 DIAGNOSIS — F410 Panic disorder [episodic paroxysmal anxiety] without agoraphobia: Secondary | ICD-10-CM | POA: Insufficient documentation

## 2013-11-16 DIAGNOSIS — F3289 Other specified depressive episodes: Secondary | ICD-10-CM | POA: Insufficient documentation

## 2013-11-16 DIAGNOSIS — Z91040 Latex allergy status: Secondary | ICD-10-CM | POA: Insufficient documentation

## 2013-11-16 DIAGNOSIS — I5030 Unspecified diastolic (congestive) heart failure: Secondary | ICD-10-CM | POA: Insufficient documentation

## 2013-11-16 DIAGNOSIS — Z794 Long term (current) use of insulin: Secondary | ICD-10-CM | POA: Insufficient documentation

## 2013-11-16 DIAGNOSIS — Z862 Personal history of diseases of the blood and blood-forming organs and certain disorders involving the immune mechanism: Secondary | ICD-10-CM | POA: Insufficient documentation

## 2013-11-16 DIAGNOSIS — Z87891 Personal history of nicotine dependence: Secondary | ICD-10-CM | POA: Insufficient documentation

## 2013-11-16 DIAGNOSIS — Z79899 Other long term (current) drug therapy: Secondary | ICD-10-CM | POA: Insufficient documentation

## 2013-11-16 DIAGNOSIS — G4730 Sleep apnea, unspecified: Secondary | ICD-10-CM | POA: Insufficient documentation

## 2013-11-16 DIAGNOSIS — E785 Hyperlipidemia, unspecified: Secondary | ICD-10-CM | POA: Insufficient documentation

## 2013-11-16 DIAGNOSIS — I10 Essential (primary) hypertension: Secondary | ICD-10-CM | POA: Insufficient documentation

## 2013-11-16 DIAGNOSIS — Z8619 Personal history of other infectious and parasitic diseases: Secondary | ICD-10-CM | POA: Insufficient documentation

## 2013-11-16 DIAGNOSIS — Z860100 Personal history of colon polyps, unspecified: Secondary | ICD-10-CM | POA: Insufficient documentation

## 2013-11-16 DIAGNOSIS — R0602 Shortness of breath: Secondary | ICD-10-CM

## 2013-11-16 DIAGNOSIS — J441 Chronic obstructive pulmonary disease with (acute) exacerbation: Secondary | ICD-10-CM | POA: Insufficient documentation

## 2013-11-16 DIAGNOSIS — I509 Heart failure, unspecified: Secondary | ICD-10-CM

## 2013-11-16 DIAGNOSIS — IMO0002 Reserved for concepts with insufficient information to code with codable children: Secondary | ICD-10-CM | POA: Insufficient documentation

## 2013-11-16 DIAGNOSIS — Z9981 Dependence on supplemental oxygen: Secondary | ICD-10-CM | POA: Insufficient documentation

## 2013-11-16 DIAGNOSIS — Z7982 Long term (current) use of aspirin: Secondary | ICD-10-CM | POA: Insufficient documentation

## 2013-11-16 DIAGNOSIS — K219 Gastro-esophageal reflux disease without esophagitis: Secondary | ICD-10-CM | POA: Insufficient documentation

## 2013-11-16 DIAGNOSIS — G8929 Other chronic pain: Secondary | ICD-10-CM | POA: Insufficient documentation

## 2013-11-16 MED ORDER — FUROSEMIDE 10 MG/ML IJ SOLN
80.0000 mg | Freq: Once | INTRAMUSCULAR | Status: DC
  Filled 2013-11-16: qty 8

## 2013-11-16 NOTE — ED Notes (Addendum)
Pt to ED via [facility] for evaluation of increased shortness of breath and tightness in her chest today- pt did 3 at home breathing treatments, upon arrival to ED pt on EMS breathing treatment 5mg  Albuterol 0.5 Atrovent- wheezing still noted throughout.  Pt states her "water pills haven't been working", states she gained 12lbs over 9 day period.  Has appointment with cardiologist tomorrow.  Dr. Cheri Guppy at bedside, NSR on monitor.

## 2013-11-17 ENCOUNTER — Emergency Department (HOSPITAL_COMMUNITY): Payer: PRIVATE HEALTH INSURANCE

## 2013-11-17 ENCOUNTER — Ambulatory Visit: Payer: Self-pay | Admitting: Pulmonary Disease

## 2013-11-17 LAB — BASIC METABOLIC PANEL
BUN: 10 mg/dL (ref 6–23)
CO2: 30 mEq/L (ref 19–32)
Calcium: 8.5 mg/dL (ref 8.4–10.5)
Chloride: 95 mEq/L — ABNORMAL LOW (ref 96–112)
Creatinine, Ser: 0.97 mg/dL (ref 0.50–1.10)
GFR calc Af Amer: 80 mL/min — ABNORMAL LOW (ref >90)
GFR calc non Af Amer: 69 mL/min — ABNORMAL LOW (ref >90)
Glucose, Bld: 147 mg/dL — ABNORMAL HIGH (ref 70–99)
Potassium: 3.9 mEq/L (ref 3.7–5.3)
Sodium: 139 mEq/L (ref 137–147)

## 2013-11-17 LAB — CBC WITH DIFFERENTIAL/PLATELET
BASOS PCT: 0 % (ref 0–1)
Basophils Absolute: 0 10*3/uL (ref 0.0–0.1)
Eosinophils Absolute: 0.3 10*3/uL (ref 0.0–0.7)
Eosinophils Relative: 4 % (ref 0–5)
HCT: 41.4 % (ref 36.0–46.0)
Hemoglobin: 13.5 g/dL (ref 12.0–15.0)
Lymphocytes Relative: 17 % (ref 12–46)
Lymphs Abs: 1.3 10*3/uL (ref 0.7–4.0)
MCH: 26.3 pg (ref 26.0–34.0)
MCHC: 32.6 g/dL (ref 30.0–36.0)
MCV: 80.7 fL (ref 78.0–100.0)
MONOS PCT: 6 % (ref 3–12)
Monocytes Absolute: 0.4 10*3/uL (ref 0.1–1.0)
NEUTROS ABS: 5.4 10*3/uL (ref 1.7–7.7)
Neutrophils Relative %: 73 % (ref 43–77)
Platelets: 182 10*3/uL (ref 150–400)
RBC: 5.13 MIL/uL — ABNORMAL HIGH (ref 3.87–5.11)
RDW: 15.4 % (ref 11.5–15.5)
WBC: 7.3 10*3/uL (ref 4.0–10.5)

## 2013-11-17 LAB — INFLUENZA PANEL BY PCR (TYPE A & B)
H1N1FLUPCR: NOT DETECTED
Influenza A By PCR: NEGATIVE
Influenza B By PCR: NEGATIVE

## 2013-11-17 LAB — PRO B NATRIURETIC PEPTIDE: PRO B NATRI PEPTIDE: 53.5 pg/mL (ref 0–125)

## 2013-11-17 LAB — I-STAT TROPONIN, ED: Troponin i, poc: 0 ng/mL (ref 0.00–0.08)

## 2013-11-17 MED ORDER — ALBUTEROL SULFATE (2.5 MG/3ML) 0.083% IN NEBU
5.0000 mg | INHALATION_SOLUTION | Freq: Once | RESPIRATORY_TRACT | Status: AC
  Administered 2013-11-17: 5 mg via RESPIRATORY_TRACT
  Filled 2013-11-17: qty 6

## 2013-11-17 MED ORDER — ACETAMINOPHEN 325 MG PO TABS
650.0000 mg | ORAL_TABLET | Freq: Once | ORAL | Status: AC
  Administered 2013-11-17: 650 mg via ORAL
  Filled 2013-11-17: qty 2

## 2013-11-17 MED ORDER — ONDANSETRON HCL 4 MG/2ML IJ SOLN
4.0000 mg | Freq: Once | INTRAMUSCULAR | Status: AC
  Administered 2013-11-17: 4 mg via INTRAVENOUS
  Filled 2013-11-17: qty 2

## 2013-11-17 MED ORDER — IPRATROPIUM BROMIDE 0.02 % IN SOLN
0.5000 mg | Freq: Once | RESPIRATORY_TRACT | Status: AC
  Administered 2013-11-17: 0.5 mg via RESPIRATORY_TRACT
  Filled 2013-11-17: qty 2.5

## 2013-11-17 MED ORDER — PREDNISONE 20 MG PO TABS: ORAL_TABLET | ORAL | Status: DC

## 2013-11-17 NOTE — ED Provider Notes (Signed)
CSN: [id number]     Arrival date & time 11/16/13  2326 History   First MD Initiated Contact with Patient 11/16/13 2332     Chief Complaint  Patient presents with  . Shortness of Breath     (Consider location/radiation/quality/duration/timing/severity/associated sxs/prior Treatment) Patient is a 48 y.o. female presenting with shortness of breath.  Shortness of Breath  Patient is a 48 yo woman with multiple chronic medical problems including diastolic heart failure, panic attacks, HTN, HLD, chronic headaches, fibromyalgia, anxiety, depression, COPD/asthma.   She presents with complaints of SOB associated with tightness in chest. Patient has orthopnea. No fever. She believes that she has gained weight - about 12 lbs - and believes that her diuretic is not working. She denies missed doses of any meds. She has had a mild, productive cough and endorses wheezing. She had multiple nebs en route and feels improved. .   Past Medical History  Diagnosis Date  . Hypertension   . Hyperlipidemia   . Chronic headache   . Fibromyalgia     daily narcotics  . Anxiety     hx chronic BZ use, stopped 07/2010  . Anemia   . Pulmonary sarcoidosis     unimpressive CT chest 2011  . Colonic polyp   . GERD (gastroesophageal reflux disease)   . ALLERGIC RHINITIS   . Asthma   . CHF (congestive heart failure)     Diastolic with fluid overload, May, 2012, LVEF 60%  . Morbid obesity   . Depression   . Panic attacks   . Diabetes mellitus   . COPD (chronic obstructive pulmonary disease)     on home O2, moderate airflow obstruction, suspect d/t emphysema  . Obesity   . Elevated LFTs 09/2011  . Ovarian cyst   . Chronic back pain   . Sleep apnea      CPAP   Past Surgical History  Procedure Laterality Date  . Polypectomy  2011  . Lumbar microdiscectomy  07/06/2011    R L4-5, stern  . Back surgery    . Carpal tunnel release    . Steroid spinal injections    . Hysteroscopy w/d&c N/A 03/25/2013   Procedure: DILATATION AND CURETTAGE /HYSTEROSCOPY;  Surgeon: Lavina Hamman, MD;  Location: [facility];  Service: Gynecology;  Laterality: N/A;  . Dilation and curettage of uterus     Family History  Problem Relation Age of Onset  . Hypertension Mother   . Emphysema Father   . Hypertension Father   . Stomach cancer Father   . Allergies Brother   . Hypertension Brother   . Stomach cancer Brother   . Heart disease Mother   . Heart disease Father   . Heart disease Brother     died age 57 sudden death/MI   History  Substance Use Topics  . Smoking status: Former Smoker -- 0.25 packs/day for 29 years    Types: Cigarettes    Quit date: 06/24/2013  . Smokeless tobacco: Never Used     Comment: Resumed smoking September 2013 --2-3 CIGS DAILY AND E-CIG  . Alcohol Use: Yes     Comment: occasionally   OB History   Grav Para Term Preterm Abortions TAB SAB Ect Mult Living                 Review of Systems  Respiratory: Positive for shortness of breath.    Ten point review of symptoms performed and is negative with the exception of symptoms noted above.  Allergies  Simvastatin; Sulfamethoxazole-trimethoprim; Zolpidem tartrate; Azithromycin; Doxycycline; Latex; Metformin and related; Metronidazole; and Ciprofloxacin  Home Medications   Current Outpatient Rx  Name  Route  Sig  Dispense  Refill  . albuterol (PROVENTIL HFA;VENTOLIN HFA) 108 (90 BASE) MCG/ACT inhaler   Inhalation   Inhale 2 puffs into the lungs every 6 (six) hours as needed for wheezing or shortness of breath.         Marland Kitchen albuterol (PROVENTIL) (2.5 MG/3ML) 0.083% nebulizer solution   Nebulization   Take 2.5 mg by nebulization every 6 (six) hours as needed for wheezing.         Marland Kitchen ALPRAZolam (XANAX) 1 MG tablet      TAKE 1 TABLET BY MOUTH THREE TIMES DAILY   90 tablet   0   . aspirin EC 81 MG tablet   Oral   Take 1 tablet (81 mg total) by mouth daily.   150 tablet   2   . budesonide-formoterol (SYMBICORT)  160-4.5 MCG/ACT inhaler   Inhalation   Inhale 2 puffs into the lungs 2 (two) times daily.   1 Inhaler   2   . cetirizine (ZYRTEC) 1 MG/ML syrup      TAKE 2 TEASPOONFUL BY MOUTH AT BEDTIME   300 mL   0   . esomeprazole (NEXIUM) 40 MG capsule   Oral   Take 1 capsule (40 mg total) by mouth 2 (two) times daily.   60 capsule   5   . fluticasone (FLONASE) 50 MCG/ACT nasal spray   Each Nare   Place 2 sprays into both nostrils daily as needed for rhinitis.   16 g   2   . glimepiride (AMARYL) 1 MG tablet   Oral   Take 1 mg by mouth daily before breakfast.         . hydrOXYzine (ATARAX/VISTARIL) 10 MG tablet   Oral   Take 1 tablet (10 mg total) by mouth 3 (three) times daily as needed for itching.   60 tablet   1   . insulin aspart (NOVOLOG) 100 UNIT/ML injection   Subcutaneous   Inject 0-10 Units into the skin 3 (three) times daily with meals as needed. CBG less than 140: 0 units 141-200: 2 units 201-250: 4 units 251-300: 6 units 301-350: 8 units 351-400: 10 units 400+: call MD         . insulin glargine (LANTUS) 100 UNIT/ML injection   Subcutaneous   Inject 20-40 Units into the skin See admin instructions. Takes 20 units every evening. Also takes 20 units in the morning only if sugar is over 200         . Oxycodone HCl 10 MG TABS   Oral   Take 1 tablet (10 mg total) by mouth 3 (three) times daily as needed (pain). May fill no more than once every 30 days.   90 tablet   0   . potassium chloride SA (K-DUR,KLOR-CON) 20 MEQ tablet   Oral   Take 2 tablets (40 mEq total) by mouth daily.   30 tablet   3   . promethazine (PHENERGAN) 25 MG tablet      TAKE 1 TABLET BY MOUTH EVERY 6 HOURS AS NEEDED FOR NAUSEA   30 tablet   0   . sertraline (ZOLOFT) 100 MG tablet   Oral   Take 1 tablet (100 mg total) by mouth daily.   30 tablet   3   . spironolactone (ALDACTONE) 25 MG tablet  Oral   Take 1 tablet (25 mg total) by mouth 2 (two) times daily.   60 tablet    5   . tiotropium (SPIRIVA) 18 MCG inhalation capsule   Inhalation   Place 18 mcg into inhaler and inhale daily.         Marland Kitchen tiZANidine (ZANAFLEX) 4 MG tablet   Oral   Take 4 mg by mouth every 8 (eight) hours as needed for muscle spasms.         Marland Kitchen torsemide (DEMADEX) 100 MG tablet   Oral   Take 100 mg by mouth 2 (two) times daily.         Marland Kitchen VICTOZA 18 MG/3ML SOPN      INJECT 0.6MG  INTO THE SKIN DAILY   6 mL   3   . gabapentin (NEURONTIN) 300 MG capsule   Oral   Take 1 capsule by mouth 2 (two) times daily.          BP 123/75  Resp 22  Ht 5\' 8"  (1.727 m)  Wt 300 lb (136.079 kg)  BMI 45.63 kg/m2  SpO2 97%  LMP 10/16/2012 Physical Exam Gen: well developed and well nourished appearing Head: NCAT Eyes: PERL, EOMI Nose: no epistaixis or rhinorrhea Mouth/throat: mucosa is moist and pink Neck: supple, no stridor Lungs: RR 20-24/min, good air exchange, no wheezing, bibasilar crackles, rhonchi  CV: regular rate and rythm, good distal pulses.  Abd: soft, notender,obese,  nondistended Back: no ttp, no cva ttp Skin: warm and dry Ext: pretibial edema, normal to inspection Neuro: CN ii-xii grossly intact, no focal deficits Psyche; normal affect,  calm and cooperative.  ED Course  Procedures (including critical care time) Labs Review Results for orders placed during the hospital encounter of 11/16/13 (from the past 24 hour(s))  BASIC METABOLIC PANEL     Status: Abnormal   Collection Time    11/16/13 11:49 PM      Result Value Ref Range   Sodium 139  137 - 147 mEq/L   Potassium 3.9  3.7 - 5.3 mEq/L   Chloride 95 (*) 96 - 112 mEq/L   CO2 30  19 - 32 mEq/L   Glucose, Bld 147 (*) 70 - 99 mg/dL   BUN 10  6 - 23 mg/dL   Creatinine, Ser 0.97  0.50 - 1.10 mg/dL   Calcium 8.5  8.4 - 10.5 mg/dL   GFR calc non Af Amer 69 (*) >90 mL/min   GFR calc Af Amer 80 (*) >90 mL/min  PRO B NATRIURETIC PEPTIDE     Status: None   Collection Time    11/16/13 11:49 PM      Result  Value Ref Range   Pro B Natriuretic peptide (BNP) 53.5  0 - 125 pg/mL  CBC WITH DIFFERENTIAL     Status: Abnormal   Collection Time    11/16/13 11:49 PM      Result Value Ref Range   WBC 7.3  4.0 - 10.5 K/uL   RBC 5.13 (*) 3.87 - 5.11 MIL/uL   Hemoglobin 13.5  12.0 - 15.0 g/dL   HCT 41.4  36.0 - 46.0 %   MCV 80.7  78.0 - 100.0 fL   MCH 26.3  26.0 - 34.0 pg   MCHC 32.6  30.0 - 36.0 g/dL   RDW 15.4  11.5 - 15.5 %   Platelets 182  150 - 400 K/uL   Neutrophils Relative % 73  43 - 77 %   Neutro Abs  5.4  1.7 - 7.7 K/uL   Lymphocytes Relative 17  12 - 46 %   Lymphs Abs 1.3  0.7 - 4.0 K/uL   Monocytes Relative 6  3 - 12 %   Monocytes Absolute 0.4  0.1 - 1.0 K/uL   Eosinophils Relative 4  0 - 5 %   Eosinophils Absolute 0.3  0.0 - 0.7 K/uL   Basophils Relative 0  0 - 1 %   Basophils Absolute 0.0  0.0 - 0.1 K/uL  I-STAT TROPOININ, ED     Status: None   Collection Time    11/16/13 11:55 PM      Result Value Ref Range   Troponin i, poc 0.00  0.00 - 0.08 ng/mL   Comment 3                DG Chest 2 View (Final result)  Result time: 11/17/13 01:04:12    Final result by Rad Results In Interface (11/17/13 01:04:12)    Narrative:   CLINICAL DATA: 48 year old female shortness of breath. History of sarcoid.  EXAM: CHEST 2 VIEW  COMPARISON: 08/25/2013 and prior chest radiographs  FINDINGS: Mild cardiomegaly noted.  Interstitial prominence is unchanged.  Mild lower lung scarring is again identified.  There is no evidence of focal airspace disease, pulmonary edema, suspicious pulmonary nodule/mass, pleural effusion, or pneumothorax. No acute bony abnormalities are identified.  IMPRESSION: No evidence of acute cardiopulmonary disease.  Chronic interstitial opacities and mild cardiomegaly.      EKG: nsr, no acute ischemic changes, normal intervals, normal axis, normal qrs complex  MDM   Patient feeling better after tx with albuterol neb. She developed wheezing after  initial evaluation.. CXR at baseline. BNP is not signfiicantly elevated. The patient has an appt tomorrow with her cardiologist and is stable for discharge.     Elyn Peers, MD 11/18/13 [phone]

## 2013-11-17 NOTE — ED Notes (Signed)
Pt discharge delayed due to patient receiving breathing treatment per [facility].

## 2013-11-17 NOTE — ED Notes (Signed)
Pt verbalized understanding of discharge and prescriptions to be filled with pharmacy.  Pt in wheelchair in waiting room waiting for family to come and pick up.  Pt stable and oriented with O2 on at discharge.

## 2013-11-17 NOTE — ED Notes (Signed)
Patient transported to X-ray 

## 2013-11-19 ENCOUNTER — Ambulatory Visit: Payer: Self-pay | Admitting: Pulmonary Disease

## 2013-11-19 ENCOUNTER — Telehealth: Payer: Self-pay | Admitting: Internal Medicine

## 2013-11-19 ENCOUNTER — Ambulatory Visit: Payer: Self-pay | Admitting: Internal Medicine

## 2013-11-19 NOTE — Telephone Encounter (Signed)
Patient states that her COPD is acting up and has her really congested she is also coughing a lot. She wants to know if Dr. Asa Lente can send an rx for Prednisone to [facility] Phn#: [phone]  Fax#: [phone].  She initially had an appointment at [place] [facility] today but cancelled because she was unable to find a ride. Patient states that this pharmacy will deliver her medications if rx is sent before 4pm today. Please advise.

## 2013-11-19 NOTE — Telephone Encounter (Signed)
Pt called back stated she found rx that they gave at the hospital for this problem. Please cancel this request. Thank you

## 2013-11-20 ENCOUNTER — Other Ambulatory Visit: Payer: Self-pay | Admitting: Internal Medicine

## 2013-11-21 ENCOUNTER — Telehealth: Payer: Self-pay | Admitting: Internal Medicine

## 2013-11-21 NOTE — Telephone Encounter (Signed)
Patient states that she needs something to clear up the mucus in her chest and she thinks she may have laryngitis. Patient was seen in the ED on 11/16/13 and was told to follow-up in two days but had to cancel Wed 2/25 because she was unable to find a ride. Offered appointment this afternoon but she is unable to find a ride in today. Please advise.

## 2013-11-21 NOTE — Telephone Encounter (Signed)
Notified pt with md response.../lmb 

## 2013-11-21 NOTE — Telephone Encounter (Signed)
Mucinex DM BID for mucus and chest congestion No antibiotics for laryngitis - usually viral infection -recommended only salt water gargle and rest OV if fever or worsening symptoms - ER if severe

## 2013-11-23 ENCOUNTER — Other Ambulatory Visit: Payer: Self-pay | Admitting: Internal Medicine

## 2013-11-24 ENCOUNTER — Telehealth: Payer: Self-pay | Admitting: Surgery

## 2013-11-24 NOTE — Telephone Encounter (Signed)
CM contacted patient regarding ED F/U for COPD. F/U scheduled with  Internal Medicine 12/03/13 and [facility] Pulmonary 3/18

## 2013-11-26 ENCOUNTER — Ambulatory Visit: Payer: Self-pay | Admitting: Internal Medicine

## 2013-11-28 ENCOUNTER — Ambulatory Visit: Payer: Self-pay | Admitting: Pulmonary Disease

## 2013-11-28 ENCOUNTER — Other Ambulatory Visit: Payer: Self-pay | Admitting: Internal Medicine

## 2013-11-28 MED ORDER — PROMETHAZINE HCL 25 MG PO TABS: ORAL_TABLET | ORAL | Status: DC

## 2013-11-30 ENCOUNTER — Other Ambulatory Visit: Payer: Self-pay | Admitting: Internal Medicine

## 2013-12-01 ENCOUNTER — Other Ambulatory Visit: Payer: Self-pay | Admitting: Internal Medicine

## 2013-12-01 NOTE — Telephone Encounter (Signed)
Faxed script back to [facility].../lmb 

## 2013-12-02 MED ORDER — CETIRIZINE HCL 1 MG/ML PO SYRP: ORAL_SOLUTION | ORAL | Status: DC

## 2013-12-02 NOTE — Telephone Encounter (Signed)
Alprazolam was done on yesterday sent refill on zyrtec...[name]

## 2013-12-02 NOTE — Telephone Encounter (Signed)
Lorre Nick- please print, hold and I will sign - or ok to call in - See pending rx refill above Thanks!

## 2013-12-03 ENCOUNTER — Other Ambulatory Visit (INDEPENDENT_AMBULATORY_CARE_PROVIDER_SITE_OTHER): Payer: PRIVATE HEALTH INSURANCE

## 2013-12-03 ENCOUNTER — Ambulatory Visit: Payer: Self-pay | Admitting: Internal Medicine

## 2013-12-03 ENCOUNTER — Ambulatory Visit (INDEPENDENT_AMBULATORY_CARE_PROVIDER_SITE_OTHER): Payer: Medicare Other | Admitting: Internal Medicine

## 2013-12-03 ENCOUNTER — Encounter: Payer: Self-pay | Admitting: *Deleted

## 2013-12-03 ENCOUNTER — Telehealth: Payer: Self-pay | Admitting: *Deleted

## 2013-12-03 ENCOUNTER — Encounter: Payer: Self-pay | Admitting: Internal Medicine

## 2013-12-03 VITALS — BP 112/72 | HR 98 | Temp 98.2°F | Wt 307.0 lb

## 2013-12-03 DIAGNOSIS — J9620 Acute and chronic respiratory failure, unspecified whether with hypoxia or hypercapnia: Secondary | ICD-10-CM

## 2013-12-03 DIAGNOSIS — IMO0002 Reserved for concepts with insufficient information to code with codable children: Secondary | ICD-10-CM

## 2013-12-03 DIAGNOSIS — IMO0001 Reserved for inherently not codable concepts without codable children: Secondary | ICD-10-CM

## 2013-12-03 DIAGNOSIS — I5033 Acute on chronic diastolic (congestive) heart failure: Secondary | ICD-10-CM

## 2013-12-03 DIAGNOSIS — M545 Low back pain, unspecified: Secondary | ICD-10-CM

## 2013-12-03 DIAGNOSIS — E1165 Type 2 diabetes mellitus with hyperglycemia: Secondary | ICD-10-CM

## 2013-12-03 LAB — HEMOGLOBIN A1C: HEMOGLOBIN A1C: 7.9 % — AB (ref 4.6–6.5)

## 2013-12-03 MED ORDER — METOLAZONE 5 MG PO TABS: 5.0000 mg | ORAL_TABLET | ORAL | Status: DC

## 2013-12-03 MED ORDER — METHYLPREDNISOLONE ACETATE 80 MG/ML IJ SUSP
80.0000 mg | Freq: Once | INTRAMUSCULAR | Status: AC
  Administered 2013-12-03: 80 mg via INTRAMUSCULAR

## 2013-12-03 MED ORDER — PREDNISONE 20 MG PO TABS: 20.0000 mg | ORAL_TABLET | Freq: Two times a day (BID) | ORAL | Status: DC

## 2013-12-03 MED ORDER — OXYCODONE HCL 10 MG PO TABS: 10.0000 mg | ORAL_TABLET | Freq: Three times a day (TID) | ORAL | Status: DC | PRN

## 2013-12-03 NOTE — Assessment & Plan Note (Signed)
New dx 11/2011 - exacerbated by frequent pred use Intol of metformin due to severe diarrhea side effects  On Lantus pen qhs and Amaryl in AM - titrate as needed Added low dose Victoza 10/2013- may assist with patient's weight loss goals for controlling appetite? recheck a1c q 3-6 mo  Lab Results  Component Value Date   HGBA1C 7.3* 09/06/2013

## 2013-12-03 NOTE — Assessment & Plan Note (Signed)
Acute exacerbation with COPD 06/2013 and 07/2013 - hospitalization for same reviewed A/C dCHF - see above Chronic hypoxic resp failure with COPD - resume slow pred taper now continue bipap at home, now on nasal mask since 10/14 hosp, but noncompliance with same review baseline hypercarbia reviewed ongoing chronic inhalers (spiriva and symbicort) with prn nebulizer continue home oxygen and followup with pulmonary specialist as planned Commended pt on cessation of smoking and encouraged never to smoke again!

## 2013-12-03 NOTE — Assessment & Plan Note (Signed)
freq exacerbations - acute exac with hosp 06/2013 for COPD flare and again 07/2013 Chronic high dose diuretic - 100mg  demadex bid Added zaroxlyn 3x/week 07/2012 but stopped due to ARF with over diuresis Cautiously resume qod prn now for weight gain - erx done Also encouraged CPAP compliance and O2 continuous for chronic resp failure Continue monitor sodium intake and monitor weights at home - call if increase weight >3#/day Encouragement provided on continued weight loss efforts follow up cards and pulm as planned

## 2013-12-03 NOTE — Telephone Encounter (Signed)
Called pt inform her [employer] forms are ready for pick-up...[name]

## 2013-12-03 NOTE — Patient Instructions (Addendum)
It was good to see you today.  We have reviewed your hospital records including labs and tests today  Medications reviewed and updated -  add Zaroxyln 3x/week as needed to ongoing medications for fluid and heart -  Prednisone as directed Your prescription(s) have been submitted to your pharmacy. Please take as directed and contact our office if you believe you are having problem(s) with the medication(s).  Test(s) ordered today. Your results will be released to [place] (or called to you) after review, usually within 72hours after test completion. If any changes need to be made, you will be notified at that same time.  Schedule followup here in 3 months to review diabetes mellitus, depression, weight, fluid, breathing and other medical issues; call sooner if problems  Keep followup with Drs. Idelle Crouch and your other specialists as planned  Congratulations on being done smoking! Do not start smoking again!!!

## 2013-12-03 NOTE — Progress Notes (Signed)
Subjective:    [name], female    DOB: 1965-11-01, 48 y.o.   MRN: [id number]  HPI  Patient is here for follow up - reviewed chronic medical issues and interval medical events  persisting dyspnea, cough, worse supine and with exertion - feels "tight", AM sputum but no fever. ER eval late 10/2013 for same - ?pred or antibiotics as reports improvement with same in past  DM2 - the patient reports compliance with medication(s) as prescribed. Denies adverse side effects.  LBP - working wit Saullo - ?ESI if a1c <7 - no change in chronic pain medication. Denies new symptoms related to chronic pain  Chronic heart failure. Reports compliance with diuretics as prescribed. Denies increased edema at this time that leg swelling last week. Variable weight  Chronic respiratory failure. Compliance with O2 as needed. 5-6 L nasal cannula oxygen with exertion or lying supine in the past 2 weeks. Chronic mucous. See above -follows with pulmonary, visit scheduled for next week  Past Medical History  Diagnosis Date  . Hypertension   . Hyperlipidemia   . Chronic headache   . Fibromyalgia     daily narcotics  . Anxiety     hx chronic BZ use, stopped 07/2010  . Anemia   . Pulmonary sarcoidosis     unimpressive CT chest 2011  . Colonic polyp   . GERD (gastroesophageal reflux disease)   . ALLERGIC RHINITIS   . Asthma   . CHF (congestive heart failure)     Diastolic with fluid overload, May, 2012, LVEF 60%  . Morbid obesity   . Depression   . Panic attacks   . Diabetes mellitus   . COPD (chronic obstructive pulmonary disease)     on home O2, moderate airflow obstruction, suspect d/t emphysema  . Obesity   . Elevated LFTs 09/2011  . Ovarian cyst   . Chronic back pain   . Sleep apnea      CPAP   Family History  Problem Relation Age of Onset  . Hypertension Mother   . Emphysema Father   . Hypertension Father   . Stomach cancer Father   . Allergies Brother   .  Hypertension Brother   . Stomach cancer Brother   . Heart disease Mother   . Heart disease Father   . Heart disease Brother     died age 83 sudden death/MI   History  Substance Use Topics  . Smoking status: Former Smoker -- 0.25 packs/day for 29 years    Types: Cigarettes    Quit date: 06/24/2013  . Smokeless tobacco: Never Used     Comment: Resumed smoking September 2013 --2-3 CIGS DAILY AND E-CIG  . Alcohol Use: Yes     Comment: occasionally    Review of Systems  Constitutional: Positive for fatigue and unexpected weight change. Negative for fever.  Respiratory: Positive for cough, chest tightness and shortness of breath.   Cardiovascular: Positive for chest pain. Negative for palpitations and leg swelling.  Gastrointestinal: Negative for nausea, vomiting, diarrhea, constipation and blood in stool.  Musculoskeletal: Positive for back pain. Negative for gait problem and joint swelling.  Neurological: Negative for dizziness, weakness and headaches.  Psychiatric/Behavioral: Negative for dysphoric mood and decreased concentration.  All other systems reviewed and are negative.       Objective:   Physical Exam  BP 112/72  Pulse 98  Temp(Src) 98.2 F (36.8 C) (Oral)  Wt 307 lb (139.254 kg)  SpO2 92%  LMP 10/16/2012 Wt Readings from Last 3 Encounters:  12/03/13 307 lb (139.254 kg)  11/16/13 300 lb (136.079 kg)  10/06/13 301 lb 6.4 oz (136.714 kg)    Constitutional: She is obese, chronic dyspnea at rest, wearing O2 -appears well-developed and well-nourished. No acute distress.  Neck: Normal range of motion. Neck supple. No JVD present. No thyromegaly present.  Cardiovascular: Distant but appears normal rate, regular rhythm and normal heart sounds.  No murmur heard. No BLE edema. Pulmonary/Chest: Effort mildly increased at rest and breath sounds diminished at bases. No respiratory distress. She has no wheezes or cracles.  Psychiatric: She has a normal mood and affect. Her  behavior is normal. Judgment and thought content normal.   Lab Results  Component Value Date   WBC 7.3 11/16/2013   HGB 13.5 11/16/2013   HCT 41.4 11/16/2013   PLT 182 11/16/2013   GLUCOSE 147* 11/16/2013   CHOL 304* 09/03/2013   TRIG 340.0* 09/03/2013   HDL 49.50 09/03/2013   LDLDIRECT 204.3 09/03/2013   LDLCALC  Value: 92        Total Cholesterol/HDL:CHD Risk Coronary Heart Disease Risk Table                     Men   Women  1/2 Average Risk   3.4   3.3  Average Risk       5.0   4.4  2 X Average Risk   9.6   7.1  3 X Average Risk  23.4   11.0        Use the calculated Patient Ratio above and the CHD Risk Table to determine the patient's CHD Risk.        ATP III CLASSIFICATION (LDL):  <100     mg/dL   Optimal  100-129  mg/dL   Near or Above                    Optimal  130-159  mg/dL   Borderline  160-189  mg/dL   High  >190     mg/dL   Very High 03/07/2010   ALT 35 08/25/2013   AST 20 08/25/2013   NA 139 11/16/2013   K 3.9 11/16/2013   CL 95* 11/16/2013   CREATININE 0.97 11/16/2013   BUN 10 11/16/2013   CO2 30 11/16/2013   TSH 0.701 04/22/2012   INR 0.96 08/03/2013   HGBA1C 7.3* 09/06/2013   MICROALBUR 0.5 09/03/2013    Dg Chest 2 View  11/17/2013   CLINICAL DATA:  48 year old female shortness of breath. History of sarcoid.  EXAM: CHEST  2 VIEW  COMPARISON:  08/25/2013 and prior chest radiographs  FINDINGS: Mild cardiomegaly noted.  Interstitial prominence is unchanged.  Mild lower lung scarring is again identified.  There is no evidence of focal airspace disease, pulmonary edema, suspicious pulmonary nodule/mass, pleural effusion, or pneumothorax. No acute bony abnormalities are identified.  IMPRESSION: No evidence of acute cardiopulmonary disease.  Chronic interstitial opacities and mild cardiomegaly.   Electronically Signed   By: Hassan Rowan M.D.   On: 11/17/2013 01:04       Assessment & Plan:   Problem List Items Addressed This Visit   Acute on chronic diastolic heart failure - Primary      freq exacerbations - acute exac with hosp 06/2013 for COPD flare and again 07/2013 Chronic high dose diuretic - 100mg  demadex bid Added zaroxlyn 3x/week 07/2012 but stopped due to ARF with over diuresis Cautiously  resume qod prn now for weight gain - erx done Also encouraged CPAP compliance and O2 continuous for chronic resp failure Continue monitor sodium intake and monitor weights at home - call if increase weight >3#/day Encouragement provided on continued weight loss efforts follow up cards and pulm as planned    Relevant Medications      metolazone (ZAROXOLYN) tablet   Acute-on-chronic respiratory failure     Acute exacerbation with COPD 06/2013 and 07/2013 - hospitalization for same reviewed A/C dCHF - see above Chronic hypoxic resp failure with COPD - resume slow pred taper now continue bipap at home, now on nasal mask since 10/14 hosp, but noncompliance with same review baseline hypercarbia reviewed ongoing chronic inhalers (spiriva and symbicort) with prn nebulizer continue home oxygen and followup with pulmonary specialist as planned Commended pt on cessation of smoking and encouraged never to smoke again!    Diabetes type 2, uncontrolled      New dx 11/2011 - exacerbated by frequent pred use Intol of metformin due to severe diarrhea side effects  On Lantus pen qhs and Amaryl in AM - titrate as needed Added low dose Victoza 10/2013- may assist with patient's weight loss goals for controlling appetite? recheck a1c q 3-6 mo  Lab Results  Component Value Date   HGBA1C 7.3* 09/06/2013      Relevant Orders      Hemoglobin A1c   Low back pain     ER visit for RLE numbness and pain 01/2011 and again 06/2011> MRI with free fragment and encroachment on L4 root -  status post R L4-5 microdiscectomy 07/06/2011 > symptoms improved but not resolved Repeat neurosurgical evaluation 2013 and February 2014, patient declined repeat surgical intervention at that time Then eval by ortho  spine - tooke - recommended ESI, but pt declined Exacerbated by accidental fall December 2014 with recurrent intermittent severe pain New MRI lumbar 10/2013 - now following with Saullo - ?ESI Continue muscle relaxant continue oxy as needed - refills today- reviewed chronic pain mgmt as ongoing, no increase in narcotic dose     Relevant Medications      predniSONE (DELTASONE) tablet      Oxycodone HCl 10 MG TABS     Also IM Medrol 80mg  today in office for acute COPD flare (see resp failure above) -  Time spent with pt today 40 minutes, greater than 50% time spent counseling patient on respiratory failure, heart failure, COPD exacerbation, diabetes mellitus, back pain and medication review. Also review of prior records

## 2013-12-03 NOTE — Assessment & Plan Note (Signed)
ER visit for RLE numbness and pain 01/2011 and again 06/2011> MRI with free fragment and encroachment on L4 root -  status post R L4-5 microdiscectomy 07/06/2011 > symptoms improved but not resolved Repeat neurosurgical evaluation 2013 and February 2014, patient declined repeat surgical intervention at that time Then eval by ortho spine - tooke - recommended ESI, but pt declined Exacerbated by accidental fall December 2014 with recurrent intermittent severe pain New MRI lumbar 10/2013 - now following with Saullo - ?ESI Continue muscle relaxant continue oxy as needed - refills today- reviewed chronic pain mgmt as ongoing, no increase in narcotic dose

## 2013-12-08 ENCOUNTER — Telehealth: Payer: Self-pay | Admitting: *Deleted

## 2013-12-08 NOTE — Telephone Encounter (Signed)
Pt called states she began having cramping and muscle spasms since starting Metolazone.  Please advise in Dr Katheren Puller absence.

## 2013-12-08 NOTE — Telephone Encounter (Signed)
Pls stop Metronidazole Thx

## 2013-12-09 ENCOUNTER — Emergency Department (HOSPITAL_COMMUNITY)
Admission: EM | Admit: 2013-12-09 | Discharge: 2013-12-09 | Discharge status: Against Medical Advice | Payer: Medicare Other | Attending: Emergency Medicine | Admitting: Emergency Medicine

## 2013-12-09 ENCOUNTER — Encounter (HOSPITAL_COMMUNITY): Payer: Self-pay | Admitting: Emergency Medicine

## 2013-12-09 ENCOUNTER — Ambulatory Visit (HOSPITAL_COMMUNITY): Payer: Self-pay | Admitting: Psychiatry

## 2013-12-09 DIAGNOSIS — K5900 Constipation, unspecified: Secondary | ICD-10-CM | POA: Insufficient documentation

## 2013-12-09 DIAGNOSIS — I10 Essential (primary) hypertension: Secondary | ICD-10-CM | POA: Insufficient documentation

## 2013-12-09 DIAGNOSIS — R05 Cough: Secondary | ICD-10-CM | POA: Insufficient documentation

## 2013-12-09 DIAGNOSIS — G8929 Other chronic pain: Secondary | ICD-10-CM | POA: Insufficient documentation

## 2013-12-09 DIAGNOSIS — E119 Type 2 diabetes mellitus without complications: Secondary | ICD-10-CM | POA: Insufficient documentation

## 2013-12-09 DIAGNOSIS — I509 Heart failure, unspecified: Secondary | ICD-10-CM | POA: Insufficient documentation

## 2013-12-09 DIAGNOSIS — R109 Unspecified abdominal pain: Secondary | ICD-10-CM | POA: Insufficient documentation

## 2013-12-09 DIAGNOSIS — F17200 Nicotine dependence, unspecified, uncomplicated: Secondary | ICD-10-CM | POA: Insufficient documentation

## 2013-12-09 DIAGNOSIS — R059 Cough, unspecified: Secondary | ICD-10-CM | POA: Insufficient documentation

## 2013-12-09 DIAGNOSIS — J449 Chronic obstructive pulmonary disease, unspecified: Secondary | ICD-10-CM | POA: Insufficient documentation

## 2013-12-09 DIAGNOSIS — J45909 Unspecified asthma, uncomplicated: Secondary | ICD-10-CM | POA: Insufficient documentation

## 2013-12-09 DIAGNOSIS — J4489 Other specified chronic obstructive pulmonary disease: Secondary | ICD-10-CM | POA: Insufficient documentation

## 2013-12-09 LAB — CBC WITH DIFFERENTIAL/PLATELET
BASOS PCT: 0 % (ref 0–1)
Basophils Absolute: 0 10*3/uL (ref 0.0–0.1)
Eosinophils Absolute: 0.1 10*3/uL (ref 0.0–0.7)
Eosinophils Relative: 0 % (ref 0–5)
HEMATOCRIT: 47.5 % — AB (ref 36.0–46.0)
Hemoglobin: 16.2 g/dL — ABNORMAL HIGH (ref 12.0–15.0)
LYMPHS PCT: 6 % — AB (ref 12–46)
Lymphs Abs: 1.1 10*3/uL (ref 0.7–4.0)
MCH: 25.8 pg — ABNORMAL LOW (ref 26.0–34.0)
MCHC: 34.1 g/dL (ref 30.0–36.0)
MCV: 75.6 fL — ABNORMAL LOW (ref 78.0–100.0)
MONO ABS: 0.7 10*3/uL (ref 0.1–1.0)
Monocytes Relative: 4 % (ref 3–12)
NEUTROS PCT: 89 % — AB (ref 43–77)
Neutro Abs: 16 10*3/uL — ABNORMAL HIGH (ref 1.7–7.7)
Platelets: 234 10*3/uL (ref 150–400)
RBC: 6.28 MIL/uL — ABNORMAL HIGH (ref 3.87–5.11)
RDW: 15.8 % — AB (ref 11.5–15.5)
WBC: 17.9 10*3/uL — AB (ref 4.0–10.5)

## 2013-12-09 LAB — COMPREHENSIVE METABOLIC PANEL
ALBUMIN: 3.9 g/dL (ref 3.5–5.2)
ALT: 21 U/L (ref 0–35)
AST: 34 U/L (ref 0–37)
Alkaline Phosphatase: 109 U/L (ref 39–117)
BILIRUBIN TOTAL: 0.6 mg/dL (ref 0.3–1.2)
BUN: 37 mg/dL — ABNORMAL HIGH (ref 6–23)
CHLORIDE: 78 meq/L — AB (ref 96–112)
CO2: 24 meq/L (ref 19–32)
CREATININE: 0.98 mg/dL (ref 0.50–1.10)
Calcium: 10 mg/dL (ref 8.4–10.5)
GFR calc Af Amer: 79 mL/min — ABNORMAL LOW (ref >90)
GFR, EST NON AFRICAN AMERICAN: 68 mL/min — AB (ref >90)
Glucose, Bld: 266 mg/dL — ABNORMAL HIGH (ref 70–99)
POTASSIUM: 3.8 meq/L (ref 3.7–5.3)
SODIUM: 127 meq/L — AB (ref 137–147)
Total Protein: 8.4 g/dL — ABNORMAL HIGH (ref 6.0–8.3)

## 2013-12-09 LAB — I-STAT TROPONIN, ED: TROPONIN I, POC: 0 ng/mL (ref 0.00–0.08)

## 2013-12-09 LAB — PRO B NATRIURETIC PEPTIDE: Pro B Natriuretic peptide (BNP): 24.6 pg/mL (ref 0–125)

## 2013-12-09 NOTE — ED Notes (Signed)
Nurse First Rounds : Unable to locate pt. at triage and waiting area several times by staff.

## 2013-12-09 NOTE — ED Notes (Signed)
Pt was given a fluid pill to help pull fluid off and made her have abdominal cramping.  Pt has congested cough, no change in fluid retention, no change in sob, and reports constipation.  Pt is on home 02

## 2013-12-09 NOTE — Telephone Encounter (Signed)
Spoke with pt advised of MDs message 

## 2013-12-10 ENCOUNTER — Ambulatory Visit (INDEPENDENT_AMBULATORY_CARE_PROVIDER_SITE_OTHER): Payer: Medicare Other | Admitting: Pulmonary Disease

## 2013-12-10 ENCOUNTER — Encounter: Payer: Self-pay | Admitting: Pulmonary Disease

## 2013-12-10 VITALS — BP 108/80 | HR 78 | Temp 97.4°F | Ht 68.5 in | Wt 297.2 lb

## 2013-12-10 DIAGNOSIS — J439 Emphysema, unspecified: Secondary | ICD-10-CM

## 2013-12-10 DIAGNOSIS — J438 Other emphysema: Secondary | ICD-10-CM

## 2013-12-10 DIAGNOSIS — G4733 Obstructive sleep apnea (adult) (pediatric): Secondary | ICD-10-CM

## 2013-12-10 DIAGNOSIS — J9610 Chronic respiratory failure, unspecified whether with hypoxia or hypercapnia: Secondary | ICD-10-CM

## 2013-12-10 DIAGNOSIS — E662 Morbid (severe) obesity with alveolar hypoventilation: Secondary | ICD-10-CM

## 2013-12-10 NOTE — Patient Instructions (Signed)
Decrease prednisone to one 20mg  tablet each day for 3 days, then decrease to 1/2 of 20mg  tablet a day for 3 days, then stop See if you can find chlorpheniramine 4mg  otc, and take 1-2 each night at bedtime to see if it will help postnasal drip and your cough. Stay on bipap each night You MUST stop smoking if you want to get better.  followup with me again in 6mos.

## 2013-12-10 NOTE — Assessment & Plan Note (Signed)
The patient continues to have issues with cough and congestion, but she is continuing to smoke.  I have told her there is no way this can ever improve if she continues to do so. She is also having postnasal drip which contributes to her cough, and we'll see if it improves with an antihistamine. Currently, she is wheeze free on exam, and we'll therefore taper her prednisone off. If she is able to quit smoking, and continues to have chronic cough with mucus production, I would consider treating her with a trial of daliresp.

## 2013-12-10 NOTE — Progress Notes (Signed)
   Subjective:    [name], female    DOB: 01-05-1966, 48 y.o.   MRN: [id number]  HPI Patient comes in today for followup of her multiple pulmonary issues. She has known COPD and obesity hypoventilation syndrome with chronic respiratory failure, as well as sleep apnea. She also has diastolic dysfunction with a tendency toward fluid overload. She has had a recent increase in her cough with white to gray mucus production, but no purulence. This has resulted in increased shortness of breath. Unfortunately, she started back smoking, and was smoking at least half a pack per day until recently where she has cut down. She was recently seen by her primary physician to start her on prednisone at 40 mg a day. She thinks a lot of her cough is related to postnasal drip, especially overnight. She develops a glob of mucus in the back of her throat, and has a hard time mobilizing   Review of Systems  Constitutional: Negative for fever and unexpected weight change.  HENT: Negative for congestion, dental problem, ear pain, nosebleeds, postnasal drip, rhinorrhea, sinus pressure, sneezing, sore throat and trouble swallowing.   Eyes: Negative for redness and itching.  Respiratory: Positive for cough, shortness of breath and wheezing. Negative for chest tightness.   Cardiovascular: Negative for palpitations and leg swelling.  Gastrointestinal: Negative for nausea and vomiting.  Genitourinary: Negative for dysuria.  Musculoskeletal: Negative for joint swelling.  Skin: Negative for rash.  Neurological: Negative for headaches.  Hematological: Does not bruise/bleed easily.  Psychiatric/Behavioral: Negative for dysphoric mood. The patient is not nervous/anxious.        Objective:   Physical Exam Morbidly obese female in no acute distress Nose without purulence or discharge noted No skin breakdown or pressure necrosis from the CPAP mask Neck without lymphadenopathy or thyromegaly Chest  with fairly clear breath sounds, no crackles or wheezes Cardiac exam with regular rate and rhythm Lower extremities with mild edema, no cyanosis Alert and oriented, moves all 4 extremities.       Assessment & Plan:

## 2013-12-15 ENCOUNTER — Telehealth: Payer: Self-pay | Admitting: *Deleted

## 2013-12-15 ENCOUNTER — Encounter: Payer: Self-pay | Admitting: Internal Medicine

## 2013-12-15 NOTE — Telephone Encounter (Signed)
[facility] Triage Call Report Triage Record Num: [id number] Operator: Soledad Gerlach Patient Name: [name] Call Date & Time: 12/12/2013 4:37:37PM Patient Phone: [phone] PCP: [facility] Patient Gender: Female PCP Fax : Patient DOB: 1966-01-05 Practice Name: [facility] Reason for Call: Caller: [place]/Patient; PCP: Gwendolyn Grant (Adults only); CB#: [phone]; Call regarding Blood sugar 347. States she started a prednisone course 12/03/13. States she was prescribed 20mg  tabs, to take BID, and then reduce to qd, which she started 12/10/13. States her blood sugar was 347 1600 12/12/13. Took 8 units of novalog per her sliding scale. On recheck 1630 12/12/13, blood sugar 284. Denies emergent symptoms currently; RN recheck scheduled 2000 12/12/13 to check blood sugar. Patient agrees. 2015: On callback, RN unable to reach patient x 2 attempts. Messages left on identified voicemail to contact office. Protocol(s) Used: Diabetes: Control Problems Recommended Outcome per Protocol: See Provider within 4 hours Reason for Outcome: New or increasing symptoms or glucose out of control as defined by provider or action plan AND taking medications/following therapy as prescribed Care Advice: ~ SYMPTOM / CONDITION MANAGEMENT Medication Advice: - Tell provider all prescription, nonprescription or alternative medications that you take. - Know possible side effects of medication and what to do If they occur. - Discontinue all nonprescription and alternative medications, especially stimulants, until evaluated by provider.

## 2013-12-16 NOTE — Telephone Encounter (Signed)
[name] - Please schedule ROV to address  Also please send glucometer/supplies as requested

## 2013-12-17 ENCOUNTER — Emergency Department (INDEPENDENT_AMBULATORY_CARE_PROVIDER_SITE_OTHER): Payer: 59

## 2013-12-17 ENCOUNTER — Encounter (HOSPITAL_COMMUNITY): Payer: Self-pay | Admitting: Family Medicine

## 2013-12-17 ENCOUNTER — Other Ambulatory Visit: Payer: Self-pay | Admitting: *Deleted

## 2013-12-17 ENCOUNTER — Emergency Department (INDEPENDENT_AMBULATORY_CARE_PROVIDER_SITE_OTHER)
Admission: EM | Admit: 2013-12-17 | Discharge: 2013-12-17 | Disposition: A | Discharge status: Home / Self Care | Payer: Medicare Other | Source: Home / Self Care

## 2013-12-17 DIAGNOSIS — R51 Headache: Secondary | ICD-10-CM

## 2013-12-17 DIAGNOSIS — R519 Headache, unspecified: Secondary | ICD-10-CM

## 2013-12-17 DIAGNOSIS — J441 Chronic obstructive pulmonary disease with (acute) exacerbation: Secondary | ICD-10-CM

## 2013-12-17 MED ORDER — METHYLPREDNISOLONE SODIUM SUCC 125 MG IJ SOLR
125.0000 mg | Freq: Once | INTRAMUSCULAR | Status: AC
  Administered 2013-12-17: 125 mg via INTRAMUSCULAR

## 2013-12-17 MED ORDER — PREDNISONE 10 MG PO TABS: ORAL_TABLET | ORAL | Status: DC

## 2013-12-17 MED ORDER — ACETAMINOPHEN 325 MG PO TABS
ORAL_TABLET | ORAL | Status: AC
  Filled 2013-12-17: qty 1

## 2013-12-17 MED ORDER — ACETAMINOPHEN 500 MG PO TABS
1000.0000 mg | ORAL_TABLET | Freq: Four times a day (QID) | ORAL | Status: DC | PRN
  Administered 2013-12-17: 1000 mg via ORAL

## 2013-12-17 MED ORDER — ONETOUCH ULTRA 2 W/DEVICE KIT: PACK | Status: DC

## 2013-12-17 MED ORDER — METHYLPREDNISOLONE SODIUM SUCC 125 MG IJ SOLR: 125.0000 mg | Freq: Once | INTRAMUSCULAR | Status: DC

## 2013-12-17 MED ORDER — IPRATROPIUM-ALBUTEROL 0.5-2.5 (3) MG/3ML IN SOLN
RESPIRATORY_TRACT | Status: AC
  Filled 2013-12-17: qty 3

## 2013-12-17 MED ORDER — GLUCOSE BLOOD VI STRP: ORAL_STRIP | Status: DC

## 2013-12-17 MED ORDER — IPRATROPIUM-ALBUTEROL 0.5-2.5 (3) MG/3ML IN SOLN
3.0000 mL | Freq: Once | RESPIRATORY_TRACT | Status: AC
  Administered 2013-12-17: 3 mL via RESPIRATORY_TRACT

## 2013-12-17 MED ORDER — LEVOFLOXACIN 500 MG PO TABS: 500.0000 mg | ORAL_TABLET | Freq: Every day | ORAL | Status: DC

## 2013-12-17 MED ORDER — ONETOUCH DELICA LANCETS 33G MISC: Status: DC

## 2013-12-17 MED ORDER — METHYLPREDNISOLONE SODIUM SUCC 125 MG IJ SOLR
INTRAMUSCULAR | Status: AC
  Filled 2013-12-17: qty 2

## 2013-12-17 NOTE — Discharge Instructions (Signed)
You are suffering from a COPD exacerbation. This will require antibiotics and steroids to resolve Please start the prednisone taper tomorrow morning Please start the levoquin tonight Please use your inhaler every 4 hours for the next 2-3 days then as needed Please set up a follow up appointment with your pulmonologist sometime in the next 10-14 days Please use tylenol for your HA.

## 2013-12-17 NOTE — Telephone Encounter (Signed)
Sent glucose meter to pt pharmacy!...[name]

## 2013-12-17 NOTE — ED Notes (Signed)
Shortness of breath, cough, productive cough, green phlegm, hot / cold episodes.  Onset of symptoms yesterday.

## 2013-12-17 NOTE — ED Provider Notes (Signed)
CSN: [id number]     Arrival date & time 12/17/13  1650 History   None    No chief complaint on file.  (Consider location/radiation/quality/duration/timing/severity/associated sxs/prior Treatment) HPI  Cough and congestion: ongoing fior 4 wks. Saw pulmonologist last week who tapered off steroids nad started on throat spray for cough from post nasal drip. Yesterday worsening SOB. 5 treatments of albuterol. Hot and cold chills. Denies overall fatigue, weakness, syncope, HA. Coughing up green sputum. Using BIPAP at night and on 3L during the day but went up to 4L today. Denies LE swelling. Increasing orthopnea last couple of nights.    Last Echo reviewed and pt w/ EF 65-70% and grade 1 dCHF  Past Medical History  Diagnosis Date  . Hypertension   . Hyperlipidemia   . Chronic headache   . Fibromyalgia     daily narcotics  . Anxiety     hx chronic BZ use, stopped 07/2010  . Anemia   . Pulmonary sarcoidosis     unimpressive CT chest 2011  . Colonic polyp   . GERD (gastroesophageal reflux disease)   . ALLERGIC RHINITIS   . Asthma   . CHF (congestive heart failure)     Diastolic with fluid overload, May, 2012, LVEF 60%  . Morbid obesity   . Depression   . Panic attacks   . Diabetes mellitus   . COPD (chronic obstructive pulmonary disease)     on home O2, moderate airflow obstruction, suspect d/t emphysema  . Obesity   . Elevated LFTs 09/2011  . Ovarian cyst   . Chronic back pain   . Sleep apnea      CPAP   Past Surgical History  Procedure Laterality Date  . Polypectomy  2011  . Lumbar microdiscectomy  07/06/2011    R L4-5, stern  . Back surgery    . Carpal tunnel release    . Steroid spinal injections    . Hysteroscopy w/d&c N/A 03/25/2013    Procedure: DILATATION AND CURETTAGE /HYSTEROSCOPY;  Surgeon: [name], MD;  Location: [facility];  Service: Gynecology;  Laterality: N/A;  . Dilation and curettage of uterus     Family History  Problem Relation Age of Onset  .  Hypertension Mother   . Emphysema Father   . Hypertension Father   . Stomach cancer Father   . Allergies Brother   . Hypertension Brother   . Stomach cancer Brother   . Heart disease Mother   . Heart disease Father   . Heart disease Brother     died age 73 sudden death/MI   History  Substance Use Topics  . Smoking status: Current Every Day Smoker -- 0.25 packs/day for 29 years    Types: Cigarettes  . Smokeless tobacco: Never Used     Comment: Resumed smoking September 2013 --2-3 CIGS DAILY AND E-CIG  . Alcohol Use: Yes     Comment: occasionally   OB History   Grav Para Term Preterm Abortions TAB SAB Ect Mult Living                 Review of Systems  Constitutional: Positive for activity change.  Respiratory: Positive for cough, shortness of breath and wheezing. Negative for choking and chest tightness.   Cardiovascular: Negative for chest pain, palpitations and leg swelling.  All other systems reviewed and are negative.    Allergies  Simvastatin; Sulfamethoxazole-trimethoprim; Zolpidem tartrate; Azithromycin; Doxycycline; Latex; Metformin and related; Metronidazole; Metolazone; and Ciprofloxacin  Home Medications  Current Outpatient Rx  Name  Route  Sig  Dispense  Refill  . albuterol (PROVENTIL HFA;VENTOLIN HFA) 108 (90 BASE) MCG/ACT inhaler   Inhalation   Inhale 2 puffs into the lungs every 6 (six) hours as needed for wheezing or shortness of breath.         Marland Kitchen albuterol (PROVENTIL) (2.5 MG/3ML) 0.083% nebulizer solution   Nebulization   Take 2.5 mg by nebulization every 6 (six) hours as needed for wheezing.         Marland Kitchen ALPRAZolam (XANAX) 1 MG tablet   Oral   Take 1 mg by mouth 3 (three) times daily as needed for anxiety.         Marland Kitchen aspirin EC 81 MG tablet   Oral   Take 1 tablet (81 mg total) by mouth daily.   150 tablet   2   . Blood Glucose Monitoring Suppl (ONE TOUCH ULTRA 2) W/DEVICE KIT      Use as directed  Dx 250.00   1 each   0   .  budesonide-formoterol (SYMBICORT) 160-4.5 MCG/ACT inhaler   Inhalation   Inhale 2 puffs into the lungs 2 (two) times daily.   1 Inhaler   2   . cetirizine (ZYRTEC) 1 MG/ML syrup   Oral   Take 10 mg by mouth daily.         Marland Kitchen esomeprazole (NEXIUM) 40 MG capsule   Oral   Take 1 capsule (40 mg total) by mouth 2 (two) times daily.   60 capsule   5   . fluticasone (FLONASE) 50 MCG/ACT nasal spray   Each Nare   Place 2 sprays into both nostrils daily as needed for rhinitis.   16 g   2   . gabapentin (NEURONTIN) 300 MG capsule   Oral   Take 1 capsule by mouth 2 (two) times daily.         Marland Kitchen glimepiride (AMARYL) 1 MG tablet   Oral   Take 1 mg by mouth daily before breakfast.         . glucose blood (ONE TOUCH ULTRA TEST) test strip      Use to check blood sugars twice a day Dx 250.00   60 each   11   . hydrOXYzine (ATARAX/VISTARIL) 10 MG tablet   Oral   Take 1 tablet (10 mg total) by mouth 3 (three) times daily as needed for itching.   60 tablet   1   . insulin aspart (NOVOLOG) 100 UNIT/ML injection   Subcutaneous   Inject 0-10 Units into the skin 3 (three) times daily with meals as needed. CBG less than 140: 0 units 141-200: 2 units 201-250: 4 units 251-300: 6 units 301-350: 8 units 351-400: 10 units 400+: call MD         . insulin glargine (LANTUS) 100 UNIT/ML injection   Subcutaneous   Inject 20-40 Units into the skin See admin instructions. Takes 20 units every evening. Also takes 20 units in the morning only if sugar is over 200         . levofloxacin (LEVAQUIN) 500 MG tablet   Oral   Take 1 tablet (500 mg total) by mouth daily.   14 tablet   0   . Liraglutide (VICTOZA) 18 MG/3ML SOPN   Subcutaneous   Inject 18 mg into the skin daily.         Glory Rosebush DELICA LANCETS 95A MISC      Use to help  check blood sugars twice a day Dx 250.00   60 each   11   . Oxycodone HCl 10 MG TABS   Oral   Take 1 tablet (10 mg total) by mouth 3 (three)  times daily as needed (pain). May fill no more than once every 30 days.   90 tablet   0   . potassium chloride SA (K-DUR,KLOR-CON) 20 MEQ tablet   Oral   Take 2 tablets (40 mEq total) by mouth daily.   30 tablet   3   . predniSONE (DELTASONE) 10 MG tablet      Take 5 tablets by mouth for 5 days then 3 tablets for 3 days, then 2 tablets for 3 days then 1 tablet for 3 days   43 tablet   0   . predniSONE (DELTASONE) 20 MG tablet   Oral   Take 1 tablet (20 mg total) by mouth 2 (two) times daily with a meal. Or as directed   40 tablet   0   . sertraline (ZOLOFT) 100 MG tablet   Oral   Take 1 tablet (100 mg total) by mouth daily.   30 tablet   3   . spironolactone (ALDACTONE) 25 MG tablet   Oral   Take 1 tablet (25 mg total) by mouth 2 (two) times daily.   60 tablet   5   . tiotropium (SPIRIVA) 18 MCG inhalation capsule   Inhalation   Place 18 mcg into inhaler and inhale daily.         Marland Kitchen tiZANidine (ZANAFLEX) 4 MG tablet   Oral   Take 4 mg by mouth every 8 (eight) hours as needed for muscle spasms.         Marland Kitchen torsemide (DEMADEX) 100 MG tablet   Oral   Take 100 mg by mouth 2 (two) times daily.          BP 108/48  Pulse 91  Temp(Src) 98.9 F (37.2 C) (Oral)  Resp 21  SpO2 97%  LMP 10/16/2012 Physical Exam  Constitutional: She appears well-developed and well-nourished. She appears distressed (mild).  HENT:  Head: Normocephalic and atraumatic.  Eyes: EOM are normal. Pupils are equal, round, and reactive to light.  Neck: Normal range of motion. No JVD present.  Cardiovascular: Normal rate, normal heart sounds and intact distal pulses.   Pulmonary/Chest:  Increase WOB. O2 4L [place]. Wheezing throughout. No consolidation or ronchi/crackles  Musculoskeletal: Normal range of motion. She exhibits no edema.  Neurological: She is alert. No cranial nerve deficit. Coordination normal.  Skin: Skin is warm. No rash noted.  Psychiatric: She has a normal mood and affect.  Her behavior is normal. Judgment and thought content normal.    ED Course  Procedures (including critical care time) Labs Review Labs Reviewed - No data to display Imaging Review Dg Chest 2 View  12/17/2013   CLINICAL DATA:  Progressive shortness of breath and wheezing ; history on pneumonia and asthmatic bronchitis ; current tobacco use  EXAM: CHEST  2 VIEW  COMPARISON:  DG CHEST 2 VIEW dated 11/17/2013; DG CHEST 2 VIEW dated 08/25/2013  FINDINGS: The lungs are adequately inflated. The interstitial markings are mildly increased but are not as conspicuous as on the previous study. There are coarse atelectatic or fibrotic changes in the lower lobes bilaterally. The cardiac silhouette is top-normal in size. The pulmonary vascularity is not engorged. There is no pleural effusion. The mediastinum is normal in width. The trachea is midline.  IMPRESSION: The findings are consistent with COPD. There is bibasilar scarring. I cannot exclude superimposed subsegmental atelectasis. There is no alveolar pneumonia nor evidence of pulmonary edema. Followup films following therapy would be useful to assure clearing.   Electronically Signed   By: David  Martinique   On: 12/17/2013 17:29     MDM   1. Headache   2. COPD exacerbation    48yo F w/ Advanced COPD in current exacerbation. Increased O2 demands. CXR reasurring. No sign of CHF exacerbation based on exam, CXR and Echo. Significant allergies and ABX resistance. 125 solumedrol given in clinic. duoneb given. Pt improved after therapy. HA likely from COPD exacerbation, no evidence of intracranial process.  - start 14 days of Levaquin - start prednisone taper 14 days (recently on 9 days of steroid) - f/u w/ pulmonologist in 10-14 days - precautions given and all questions answered  Linna Darner, MD Family Medicine PGY-3 12/17/2013, 6:18 PM      Waldemar Dickens, MD 12/17/13 [phone]

## 2013-12-17 NOTE — ED Notes (Signed)
Patient transported to X-ray.  Patient not in treatment room

## 2013-12-17 NOTE — ED Provider Notes (Signed)
Medical screening examination/treatment/procedure(s) were performed by a resident physician or non-physician practitioner and as the supervising physician I was immediately available for consultation/collaboration.  Marchella Hibbard, MD    Navdeep Halt S Zaylin Runco, MD 12/17/13 2153 

## 2013-12-20 ENCOUNTER — Encounter: Payer: Self-pay | Admitting: Internal Medicine

## 2013-12-26 ENCOUNTER — Other Ambulatory Visit: Payer: Self-pay | Admitting: Internal Medicine

## 2013-12-29 ENCOUNTER — Other Ambulatory Visit: Payer: Self-pay | Admitting: *Deleted

## 2013-12-29 MED ORDER — SERTRALINE HCL 100 MG PO TABS: ORAL_TABLET | ORAL | Status: DC

## 2013-12-30 ENCOUNTER — Other Ambulatory Visit: Payer: Self-pay | Admitting: Internal Medicine

## 2013-12-30 ENCOUNTER — Other Ambulatory Visit: Payer: Self-pay | Admitting: Pulmonary Disease

## 2013-12-30 NOTE — Telephone Encounter (Signed)
Faxed script back to [facility].../lmb 

## 2013-12-31 ENCOUNTER — Encounter: Payer: Self-pay | Admitting: Internal Medicine

## 2014-01-01 ENCOUNTER — Other Ambulatory Visit: Payer: Self-pay | Admitting: Internal Medicine

## 2014-01-01 MED ORDER — OXYCODONE HCL 10 MG PO TABS: 10.0000 mg | ORAL_TABLET | Freq: Three times a day (TID) | ORAL | Status: DC | PRN

## 2014-01-03 ENCOUNTER — Other Ambulatory Visit: Payer: Self-pay | Admitting: Pulmonary Disease

## 2014-01-06 ENCOUNTER — Encounter: Payer: Self-pay | Admitting: Internal Medicine

## 2014-01-07 MED ORDER — CETIRIZINE HCL 1 MG/ML PO SYRP: 10.0000 mg | ORAL_SOLUTION | Freq: Every day | ORAL | Status: DC

## 2014-01-07 MED ORDER — FLUCONAZOLE 150 MG PO TABS: 150.0000 mg | ORAL_TABLET | Freq: Once | ORAL | Status: DC

## 2014-01-09 ENCOUNTER — Ambulatory Visit: Payer: Self-pay | Admitting: Endocrinology

## 2014-01-13 ENCOUNTER — Encounter: Payer: Self-pay | Admitting: Internal Medicine

## 2014-01-14 MED ORDER — VARENICLINE TARTRATE 0.5 MG X 11 & 1 MG X 42 PO MISC: ORAL | Status: DC

## 2014-01-15 NOTE — Telephone Encounter (Signed)
Faxed script to [facility].../lmb 

## 2014-01-18 ENCOUNTER — Encounter (HOSPITAL_COMMUNITY): Payer: Self-pay | Admitting: Emergency Medicine

## 2014-01-18 ENCOUNTER — Emergency Department (HOSPITAL_COMMUNITY): Payer: Medicare Other

## 2014-01-18 ENCOUNTER — Inpatient Hospital Stay (HOSPITAL_COMMUNITY)
Admission: EM | Admit: 2014-01-18 | Discharge: 2014-01-25 | DRG: 193 | Disposition: A | Discharge status: Home / Self Care | Payer: Medicare Other | Attending: Internal Medicine | Admitting: Internal Medicine

## 2014-01-18 DIAGNOSIS — I509 Heart failure, unspecified: Secondary | ICD-10-CM | POA: Diagnosis present

## 2014-01-18 DIAGNOSIS — J99 Respiratory disorders in diseases classified elsewhere: Secondary | ICD-10-CM | POA: Diagnosis present

## 2014-01-18 DIAGNOSIS — F329 Major depressive disorder, single episode, unspecified: Secondary | ICD-10-CM | POA: Diagnosis present

## 2014-01-18 DIAGNOSIS — IMO0001 Reserved for inherently not codable concepts without codable children: Secondary | ICD-10-CM

## 2014-01-18 DIAGNOSIS — I10 Essential (primary) hypertension: Secondary | ICD-10-CM

## 2014-01-18 DIAGNOSIS — D649 Anemia, unspecified: Secondary | ICD-10-CM

## 2014-01-18 DIAGNOSIS — Z794 Long term (current) use of insulin: Secondary | ICD-10-CM

## 2014-01-18 DIAGNOSIS — D869 Sarcoidosis, unspecified: Secondary | ICD-10-CM

## 2014-01-18 DIAGNOSIS — Z6841 Body Mass Index (BMI) 40.0 and over, adult: Secondary | ICD-10-CM

## 2014-01-18 DIAGNOSIS — Z7982 Long term (current) use of aspirin: Secondary | ICD-10-CM

## 2014-01-18 DIAGNOSIS — R51 Headache: Secondary | ICD-10-CM

## 2014-01-18 DIAGNOSIS — F17200 Nicotine dependence, unspecified, uncomplicated: Secondary | ICD-10-CM | POA: Diagnosis present

## 2014-01-18 DIAGNOSIS — J9620 Acute and chronic respiratory failure, unspecified whether with hypoxia or hypercapnia: Secondary | ICD-10-CM

## 2014-01-18 DIAGNOSIS — I5032 Chronic diastolic (congestive) heart failure: Secondary | ICD-10-CM

## 2014-01-18 DIAGNOSIS — E1165 Type 2 diabetes mellitus with hyperglycemia: Secondary | ICD-10-CM

## 2014-01-18 DIAGNOSIS — R7989 Other specified abnormal findings of blood chemistry: Secondary | ICD-10-CM

## 2014-01-18 DIAGNOSIS — F3289 Other specified depressive episodes: Secondary | ICD-10-CM | POA: Diagnosis present

## 2014-01-18 DIAGNOSIS — J309 Allergic rhinitis, unspecified: Secondary | ICD-10-CM

## 2014-01-18 DIAGNOSIS — E872 Acidosis, unspecified: Secondary | ICD-10-CM | POA: Diagnosis present

## 2014-01-18 DIAGNOSIS — R9430 Abnormal result of cardiovascular function study, unspecified: Secondary | ICD-10-CM

## 2014-01-18 DIAGNOSIS — J441 Chronic obstructive pulmonary disease with (acute) exacerbation: Secondary | ICD-10-CM

## 2014-01-18 DIAGNOSIS — E662 Morbid (severe) obesity with alveolar hypoventilation: Secondary | ICD-10-CM

## 2014-01-18 DIAGNOSIS — F32A Depression, unspecified: Secondary | ICD-10-CM

## 2014-01-18 DIAGNOSIS — Z720 Tobacco use: Secondary | ICD-10-CM

## 2014-01-18 DIAGNOSIS — I5033 Acute on chronic diastolic (congestive) heart failure: Secondary | ICD-10-CM

## 2014-01-18 DIAGNOSIS — Z8249 Family history of ischemic heart disease and other diseases of the circulatory system: Secondary | ICD-10-CM

## 2014-01-18 DIAGNOSIS — Z9981 Dependence on supplemental oxygen: Secondary | ICD-10-CM

## 2014-01-18 DIAGNOSIS — N939 Abnormal uterine and vaginal bleeding, unspecified: Secondary | ICD-10-CM

## 2014-01-18 DIAGNOSIS — J189 Pneumonia, unspecified organism: Principal | ICD-10-CM

## 2014-01-18 DIAGNOSIS — IMO0002 Reserved for concepts with insufficient information to code with codable children: Secondary | ICD-10-CM

## 2014-01-18 DIAGNOSIS — K219 Gastro-esophageal reflux disease without esophagitis: Secondary | ICD-10-CM

## 2014-01-18 DIAGNOSIS — M5431 Sciatica, right side: Secondary | ICD-10-CM

## 2014-01-18 DIAGNOSIS — F411 Generalized anxiety disorder: Secondary | ICD-10-CM

## 2014-01-18 DIAGNOSIS — J45901 Unspecified asthma with (acute) exacerbation: Secondary | ICD-10-CM

## 2014-01-18 DIAGNOSIS — E0781 Sick-euthyroid syndrome: Secondary | ICD-10-CM | POA: Diagnosis present

## 2014-01-18 DIAGNOSIS — M25561 Pain in right knee: Secondary | ICD-10-CM

## 2014-01-18 DIAGNOSIS — J439 Emphysema, unspecified: Secondary | ICD-10-CM

## 2014-01-18 DIAGNOSIS — R002 Palpitations: Secondary | ICD-10-CM

## 2014-01-18 DIAGNOSIS — J9610 Chronic respiratory failure, unspecified whether with hypoxia or hypercapnia: Secondary | ICD-10-CM

## 2014-01-18 DIAGNOSIS — G4733 Obstructive sleep apnea (adult) (pediatric): Secondary | ICD-10-CM

## 2014-01-18 DIAGNOSIS — M545 Low back pain, unspecified: Secondary | ICD-10-CM

## 2014-01-18 DIAGNOSIS — E876 Hypokalemia: Secondary | ICD-10-CM

## 2014-01-18 DIAGNOSIS — J8410 Pulmonary fibrosis, unspecified: Secondary | ICD-10-CM | POA: Diagnosis present

## 2014-01-18 DIAGNOSIS — E785 Hyperlipidemia, unspecified: Secondary | ICD-10-CM

## 2014-01-18 LAB — I-STAT ARTERIAL BLOOD GAS, ED
Acid-Base Excess: 3 mmol/L — ABNORMAL HIGH (ref 0.0–2.0)
Bicarbonate: 28.6 mEq/L — ABNORMAL HIGH (ref 20.0–24.0)
O2 Saturation: 99 %
TCO2: 30 mmol/L (ref 0–100)
pCO2 arterial: 48.1 mmHg — ABNORMAL HIGH (ref 35.0–45.0)
pH, Arterial: 7.382 (ref 7.350–7.450)
pO2, Arterial: 141 mmHg — ABNORMAL HIGH (ref 80.0–100.0)

## 2014-01-18 LAB — CBC WITH DIFFERENTIAL/PLATELET
BASOS PCT: 0 % (ref 0–1)
Basophils Absolute: 0 10*3/uL (ref 0.0–0.1)
EOS ABS: 0.2 10*3/uL (ref 0.0–0.7)
EOS PCT: 2 % (ref 0–5)
HCT: 39.1 % (ref 36.0–46.0)
Hemoglobin: 13.1 g/dL (ref 12.0–15.0)
LYMPHS ABS: 1.5 10*3/uL (ref 0.7–4.0)
Lymphocytes Relative: 19 % (ref 12–46)
MCH: 25.8 pg — AB (ref 26.0–34.0)
MCHC: 33.5 g/dL (ref 30.0–36.0)
MCV: 77 fL — AB (ref 78.0–100.0)
Monocytes Absolute: 0.5 10*3/uL (ref 0.1–1.0)
Monocytes Relative: 6 % (ref 3–12)
NEUTROS PCT: 73 % (ref 43–77)
Neutro Abs: 6 10*3/uL (ref 1.7–7.7)
PLATELETS: 158 10*3/uL (ref 150–400)
RBC: 5.08 MIL/uL (ref 3.87–5.11)
RDW: 18.7 % — ABNORMAL HIGH (ref 11.5–15.5)
WBC: 8.1 10*3/uL (ref 4.0–10.5)

## 2014-01-18 LAB — COMPREHENSIVE METABOLIC PANEL
ALBUMIN: 3.4 g/dL — AB (ref 3.5–5.2)
ALK PHOS: 122 U/L — AB (ref 39–117)
ALT: 39 U/L — ABNORMAL HIGH (ref 0–35)
AST: 27 U/L (ref 0–37)
BUN: 14 mg/dL (ref 6–23)
CALCIUM: 9.2 mg/dL (ref 8.4–10.5)
CO2: 26 mEq/L (ref 19–32)
Chloride: 93 mEq/L — ABNORMAL LOW (ref 96–112)
Creatinine, Ser: 0.95 mg/dL (ref 0.50–1.10)
GFR calc non Af Amer: 71 mL/min — ABNORMAL LOW (ref >90)
GFR, EST AFRICAN AMERICAN: 82 mL/min — AB (ref >90)
GLUCOSE: 200 mg/dL — AB (ref 70–99)
POTASSIUM: 3.5 meq/L — AB (ref 3.7–5.3)
SODIUM: 133 meq/L — AB (ref 137–147)
TOTAL PROTEIN: 7.4 g/dL (ref 6.0–8.3)
Total Bilirubin: 0.4 mg/dL (ref 0.3–1.2)

## 2014-01-18 LAB — TROPONIN I: Troponin I: <0.3 ng/mL (ref <0.30)

## 2014-01-18 LAB — PRO B NATRIURETIC PEPTIDE: Pro B Natriuretic peptide (BNP): 802.5 pg/mL — ABNORMAL HIGH (ref 0–125)

## 2014-01-18 LAB — CBG MONITORING, ED: Glucose-Capillary: 220 mg/dL — ABNORMAL HIGH (ref 70–99)

## 2014-01-18 MED ORDER — KETOROLAC TROMETHAMINE 30 MG/ML IJ SOLN
30.0000 mg | Freq: Once | INTRAMUSCULAR | Status: AC
  Administered 2014-01-18: 30 mg via INTRAVENOUS
  Filled 2014-01-18: qty 1

## 2014-01-18 MED ORDER — ALBUTEROL SULFATE (2.5 MG/3ML) 0.083% IN NEBU
5.0000 mg | INHALATION_SOLUTION | Freq: Once | RESPIRATORY_TRACT | Status: AC
  Administered 2014-01-18: 5 mg via RESPIRATORY_TRACT
  Filled 2014-01-18: qty 6

## 2014-01-18 MED ORDER — METHYLPREDNISOLONE SODIUM SUCC 125 MG IJ SOLR
125.0000 mg | Freq: Once | INTRAMUSCULAR | Status: AC
  Administered 2014-01-18: 125 mg via INTRAVENOUS
  Filled 2014-01-18: qty 2

## 2014-01-18 MED ORDER — PIPERACILLIN-TAZOBACTAM 3.375 G IVPB 30 MIN
3.3750 g | Freq: Once | INTRAVENOUS | Status: AC
Start: 2014-01-18 — End: 2014-01-18
  Administered 2014-01-18: 3.375 g via INTRAVENOUS
  Filled 2014-01-18: qty 50

## 2014-01-18 MED ORDER — ALBUTEROL (5 MG/ML) CONTINUOUS INHALATION SOLN: 15.0000 mg/h | INHALATION_SOLUTION | Freq: Once | RESPIRATORY_TRACT | Status: DC

## 2014-01-18 MED ORDER — ALBUTEROL (5 MG/ML) CONTINUOUS INHALATION SOLN
15.0000 mg/h | INHALATION_SOLUTION | Freq: Once | RESPIRATORY_TRACT | Status: AC
  Administered 2014-01-18: 15 mg/h via RESPIRATORY_TRACT
  Filled 2014-01-18: qty 20

## 2014-01-18 MED ORDER — IPRATROPIUM BROMIDE 0.02 % IN SOLN
0.5000 mg | Freq: Once | RESPIRATORY_TRACT | Status: AC
  Administered 2014-01-18: 0.5 mg via RESPIRATORY_TRACT
  Filled 2014-01-18: qty 2.5

## 2014-01-18 MED ORDER — VANCOMYCIN HCL IN DEXTROSE 1-5 GM/200ML-% IV SOLN
1000.0000 mg | Freq: Once | INTRAVENOUS | Status: AC
  Administered 2014-01-18: 1000 mg via INTRAVENOUS
  Filled 2014-01-18: qty 200

## 2014-01-18 NOTE — ED Notes (Signed)
Checked CBG 220

## 2014-01-18 NOTE — ED Notes (Signed)
Patient transported to X-ray 

## 2014-01-18 NOTE — H&P (Signed)
PCP:  Gwendolyn Grant, MD  [facility]  Chief Complaint:  Shortness of breath  HPI: [name] is a 48 y.o. female   has a past medical history of Hypertension; Hyperlipidemia; Chronic headache; Fibromyalgia; Anxiety; Anemia; Pulmonary sarcoidosis; Colonic polyp; GERD (gastroesophageal reflux disease); ALLERGIC RHINITIS; Asthma; CHF (congestive heart failure); Morbid obesity; Depression; Panic attacks; Diabetes mellitus; COPD (chronic obstructive pulmonary disease); Obesity; Elevated LFTs (09/2011); Ovarian cyst; Chronic back pain; and Sleep apnea.   Presented with  Patietn has hx of COPD and asthma as well as pulmonary fibrosis and sarcoidosis followed by pulmonology. Patient is on 3 L of oxygen at baseline. Patient went to [place] on the 1st of April by car. Last week she was treated for COPD exacerbation and just finished a course of levaquin and 2 week prednisone taper which she finished 2 weeks ago. For the past 2 days she have had worsening shortness of breath 3-4 of diarrhea and chills. Diarrhea have since resolved but she continued to have cough and worsening shortness of breath she started to use up to 6-8L of oxygen at home. Patietn presented to ER and was given nebulizer treatment with minimal improvement. Patient states she quit smoking today. Prior to this she was smoking about 5-6 cigarettes per day. She endorses mild chest pain with coughing. In the ER patient continued to have increased work of breathing despite nebulizer treatments. Blood gas was obtained which showed well compensated respiratory acidosis. Patient continued to have increased work of breathing and BiPAP was ordered. Hospitalist was called for admission to step down.  Review of Systems:    Pertinent positives include: chills, diarrhea, non-productive cough, shortness of breath at rest.  dyspnea on exertion,   Constitutional:  No weight loss, night sweats, Fevers,  fatigue, weight loss   HEENT:  No headaches, Difficulty swallowing,Tooth/dental problems,Sore throat,  No sneezing, itching, ear ache, nasal congestion, post nasal drip,  Cardio-vascular:  No chest pain, Orthopnea, PND, anasarca, dizziness, palpitations.no Bilateral lower extremity swelling  GI:  No heartburn, indigestion, abdominal pain, nausea, vomitingchange in bowel habits, loss of appetite, melena, blood in stool, hematemesis Resp:  no No excess mucus, no productive cough, No , No coughing up of blood.No change in color of mucus.No wheezing. Skin:  no rash or lesions. No jaundice GU:  no dysuria, change in color of urine, no urgency or frequency. No straining to urinate.  No flank pain.  Musculoskeletal:  No joint pain or no joint swelling. No decreased range of motion. No back pain.  Psych:  No change in mood or affect. No depression or anxiety. No memory loss.  Neuro: no localizing neurological complaints, no tingling, no weakness, no double vision, no gait abnormality, no slurred speech, no confusion  Otherwise ROS are negative except for above, 10 systems were reviewed  Past Medical History: Past Medical History  Diagnosis Date  . Hypertension   . Hyperlipidemia   . Chronic headache   . Fibromyalgia     daily narcotics  . Anxiety     hx chronic BZ use, stopped 07/2010  . Anemia   . Pulmonary sarcoidosis     unimpressive CT chest 2011  . Colonic polyp   . GERD (gastroesophageal reflux disease)   . ALLERGIC RHINITIS   . Asthma   . CHF (congestive heart failure)     Diastolic with fluid overload, May, 2012, LVEF 60%  . Morbid obesity   . Depression   . Panic attacks   . Diabetes  mellitus   . COPD (chronic obstructive pulmonary disease)     on home O2, moderate airflow obstruction, suspect d/t emphysema  . Obesity   . Elevated LFTs 09/2011  . Ovarian cyst   . Chronic back pain   . Sleep apnea      CPAP   Past Surgical History  Procedure Laterality Date  . Polypectomy  2011  .  Lumbar microdiscectomy  07/06/2011    R L4-5, stern  . Back surgery    . Carpal tunnel release    . Steroid spinal injections    . Hysteroscopy w/d&c N/A 03/25/2013    Procedure: DILATATION AND CURETTAGE /HYSTEROSCOPY;  Surgeon: [name], MD;  Location: [facility];  Service: Gynecology;  Laterality: N/A;  . Dilation and curettage of uterus       Medications: Prior to Admission medications   Medication Sig Start Date End Date Taking? Authorizing Provider  albuterol (PROVENTIL HFA;VENTOLIN HFA) 108 (90 BASE) MCG/ACT inhaler Inhale 2 puffs into the lungs every 6 (six) hours as needed for wheezing or shortness of breath.   Yes Historical Provider, MD  albuterol (PROVENTIL) (2.5 MG/3ML) 0.083% nebulizer solution Take 2.5 mg by nebulization every 6 (six) hours as needed for wheezing.   Yes Historical Provider, MD  ALPRAZolam Duanne Moron) 1 MG tablet Take 1 mg by mouth 3 (three) times daily.   Yes Historical Provider, MD  aspirin EC 81 MG tablet Take 1 tablet (81 mg total) by mouth daily. 06/06/13  Yes Rowe Clack, MD  budesonide-formoterol (SYMBICORT) 160-4.5 MCG/ACT inhaler Inhale 2 puffs into the lungs 2 (two) times daily. 09/04/12  Yes Reyne Dumas, MD  cetirizine (ZYRTEC) 1 MG/ML syrup Take 10 mLs (10 mg total) by mouth daily. 01/07/14  Yes Rowe Clack, MD  esomeprazole (NEXIUM) 40 MG capsule Take 1 capsule (40 mg total) by mouth 2 (two) times daily. 10/02/13  Yes Rowe Clack, MD  fluticasone (FLONASE) 50 MCG/ACT nasal spray Place 2 sprays into both nostrils daily as needed for rhinitis. 08/01/13  Yes Rowe Clack, MD  glimepiride (AMARYL) 1 MG tablet Take 1 mg by mouth daily before breakfast.   Yes Historical Provider, MD  hydrOXYzine (ATARAX/VISTARIL) 10 MG tablet Take 1 tablet (10 mg total) by mouth 3 (three) times daily as needed for itching. 10/08/13  Yes Rowe Clack, MD  insulin aspart (NOVOLOG) 100 UNIT/ML injection Inject 0-10 Units into the skin 3 (three) times  daily with meals as needed. CBG less than 140: 0 units 141-200: 2 units 201-250: 4 units 251-300: 6 units 301-350: 8 units 351-400: 10 units 400+: call MD   Yes Historical Provider, MD  insulin glargine (LANTUS) 100 UNIT/ML injection Inject 20-40 Units into the skin See admin instructions. Takes 20 units every evening. Also takes 20 units in the morning only if sugar is over 200 07/21/13  Yes Rowe Clack, MD  Liraglutide (VICTOZA) 18 MG/3ML SOPN Inject 18 mg into the skin daily.   Yes Historical Provider, MD  Oxycodone HCl 10 MG TABS Take 1 tablet (10 mg total) by mouth 3 (three) times daily as needed (pain). May fill not more than once every 30 days. 01/01/14  Yes Rowe Clack, MD  potassium chloride SA (K-DUR,KLOR-CON) 20 MEQ tablet Take 40 mEq by mouth 2 (two) times daily. 09/07/13  Yes Charlynne Cousins, MD  sertraline (ZOLOFT) 100 MG tablet Take 100 mg by mouth daily.   Yes Historical Provider, MD  spironolactone (ALDACTONE) 25 MG  tablet Take 1 tablet (25 mg total) by mouth 2 (two) times daily. 07/04/13  Yes Rowe Clack, MD  tiotropium (SPIRIVA) 18 MCG inhalation capsule Place 18 mcg into inhaler and inhale daily.   Yes Historical Provider, MD  tiZANidine (ZANAFLEX) 4 MG tablet Take 4 mg by mouth every 8 (eight) hours as needed for muscle spasms.   Yes Historical Provider, MD  torsemide (DEMADEX) 100 MG tablet Take 100 mg by mouth 2 (two) times daily.   Yes Historical Provider, MD  varenicline (CHANTIX STARTING MONTH PAK) 0.5 MG X 11 & 1 MG X 42 tablet Take one 0.5 mg tablet by mouth once daily for 3 days, then increase to one 0.5 mg tablet twice daily for 4 days, then increase to one 1 mg tablet twice daily. 01/14/14  Yes Rowe Clack, MD  Blood Glucose Monitoring Suppl (ONE TOUCH ULTRA 2) W/DEVICE KIT Use as directed  Dx 250.00 12/17/13   Rowe Clack, MD  glucose blood (ONE TOUCH ULTRA TEST) test strip Use to check blood sugars twice a day Dx 250.00 12/17/13    Rowe Clack, MD  [facility] DELICA LANCETS 87O MISC Use to help check blood sugars twice a day Dx 250.00 12/17/13   Rowe Clack, MD    Allergies:   Allergies  Allergen Reactions  . Simvastatin Other (See Comments)    Severe leg pain and burning  . Sulfamethoxazole-Trimethoprim Nausea And Vomiting    Causes Projectile Vomiting  . Zolpidem Tartrate Other (See Comments)    Chest pain  . Azithromycin Other (See Comments)    Resistant to med  . Doxycycline Other (See Comments)    Resistant to med  . Latex Itching and Rash    Also burning sensations  . Metformin And Related Diarrhea    Severe diarrhea  . Metronidazole Nausea And Vomiting  . Metolazone Other (See Comments)    Cramps,pain  . Ciprofloxacin Nausea Only    Social History:  Ambulatory   independently   Lives at  home   reports that she has been smoking Cigarettes.  She has a 7.25 pack-year smoking history. She has never used smokeless tobacco. She reports that she drinks alcohol. She reports that she uses illicit drugs (Cocaine and Marijuana).   Family History: family history includes Allergies in her brother; Emphysema in her father; Heart disease in her brother, father, and mother; Hypertension in her brother, father, and mother; Stomach cancer in her brother and father.    Physical Exam: Patient Vitals for the past 24 hrs:  BP Temp Temp src Pulse Resp SpO2  01/18/14 2230 127/62 mmHg - - 103 - 100 %  01/18/14 2200 115/42 mmHg - - 105 - 100 %  01/18/14 2148 127/64 mmHg - - 105 - 100 %  01/18/14 2004 121/55 mmHg 97.5 F (36.4 C) Oral 99 20 97 %    1. General:  in No Acute distress 2. Psychological: Alert and   Oriented 3. Head/ENT:   Moist Mucous Membranes                          Head Non traumatic, neck supple                          Normal   Dentition 4. SKIN: normal  Skin turgor,  Skin clean Dry and intact no rash 5. Heart: Regular rate and rhythm no Murmur, Rub  or gallop, rapid 6. Lungs:  diffuse wheezes some crackles   7. Abdomen: Soft, non-tender, Non distended, obese 8. Lower extremities: no clubbing, cyanosis, or edema 9. Neurologically Grossly intact, moving all 4 extremities equally 10. MSK: Normal range of motion  body mass index is unknown because there is no weight on file.   Labs on Admission:   Recent Labs  01/18/14 2034  NA 133*  K 3.5*  CL 93*  CO2 26  GLUCOSE 200*  BUN 14  CREATININE 0.95  CALCIUM 9.2    Recent Labs  01/18/14 2034  AST 27  ALT 39*  ALKPHOS 122*  BILITOT 0.4  PROT 7.4  ALBUMIN 3.4*   No results found for this basename: LIPASE, AMYLASE,  in the last 72 hours  Recent Labs  01/18/14 2034  WBC 8.1  NEUTROABS 6.0  HGB 13.1  HCT 39.1  MCV 77.0*  PLT 158    Recent Labs  01/18/14 2034  TROPONINI <0.30   No results found for this basename: TSH, T4TOTAL, FREET3, T3FREE, THYROIDAB,  in the last 72 hours No results found for this basename: VITAMINB12, FOLATE, FERRITIN, TIBC, IRON, RETICCTPCT,  in the last 72 hours Lab Results  Component Value Date   HGBA1C 7.9* 12/03/2013    The CrCl is unknown because both a height and weight (above a minimum accepted value) are required for this calculation. ABG    Component Value Date/Time   PHART 7.379 02/02/2011 1550   HCO3 36.4* 08/03/2013 0050   TCO2 38 08/03/2013 0050   O2SAT 78.0 08/03/2013 0050     Lab Results  Component Value Date   DDIMER 0.47 04/22/2012     Other results:  I have pearsonaly reviewed this: ECG REPORT  Rate: 101  Rhythm: sinus tachy ST&T Change: No ischemic changes    Cultures:    Component Value Date/Time   SDES URINE, RANDOM 06/25/2013 1119   SDES URINE, RANDOM 06/25/2013 1119   SPECREQUEST NONE 06/25/2013 1119   SPECREQUEST NONE 06/25/2013 1119   CULT  Value: NO GROWTH Performed at [facility] 06/25/2013 1119   REPTSTATUS 06/27/2013 FINAL 06/25/2013 1119   REPTSTATUS 06/26/2013 FINAL 06/25/2013 1119       Radiological Exams on  Admission: Dg Chest 2 View  01/18/2014   ONE CLINICAL DATA: History of congestion and nonproductive cough; history of COPD, diabetes, and congestive heart failure  EXAM: CHEST  2 VIEW  COMPARISON:  DG CHEST 2 VIEW dated 12/17/2013  FINDINGS: The lungs are well-expanded. The interstitial markings are increased bilaterally which is not entirely new. However, there are coarse lung markings in the retrocardiac region on the lateral film which are more conspicuous than in the past. There is no pleural effusion. The cardiopericardial silhouette is top-normal in size. The central pulmonary vascularity is prominent but stable. The mediastinum is normal in width. There is no pneumothorax or pneumomediastinum. The observed portions of the bony thorax exhibit no acute abnormalities.  IMPRESSION: There are findings consistent with pulmonary fibrosis. However, the interstitial markings are increased over and above that seen on the previous study and confluent density in the retrocardiac region likely on the left is suspicious for pneumonia. There is no definite evidence of CHF. Followup films following therapy are recommended to assure clearing. Chest CT scanning may be ultimately needed.   Electronically Signed   By: David  Martinique   On: 01/18/2014 21:12    Chart has been reviewed  Assessment/Plan  48 year old female for by pulmonology  for pulmonary fibrosis and pulmonary sarcoidosis as well as history of CHF COPD and asthma presents with acute on chronic respiratory failure and worsening hypoxia likely secondary to COPD exacerbation although pneumonia could not be excluded  Present on Admission:  . Asthma exacerbation in COPD - admit to step down on BiPAP, PCCM consult, steroids IV Zosyn pain and came in nebulizer treatments  . HYPERTENSION - continue home medications  . OBSTRUCTIVE SLEEP APNEA - for now on BiPAP  . Diabetes type 2, uncontrolled - continue Amaryl Lantus and sliding scale  . Tobacco abuse -  patient strongly encouraged to avoid smoking  . Acute-on-chronic respiratory failure plus multifactorial COPD exacerbation versus pneumonia other possibilities could be CHF exacerbation. PE much less likely but we'll obtain d-dimer if positive would obtain CT of the chest  . Chronic diastolic CHF (congestive heart failure) -  last echogram was in November 0175 showing diastolic dysfunction preserved EF, continue torsemide repeat echo gram  . Hypokalemia replace  . HCAP (healthcare-associated pneumonia)- questionable infiltrate on chest x-ray patient already recently finished Levaquin will cover broadly for now on vancomycin and Zosyn given history of pulmonary disease      Prophylaxis:   Lovenox, Protonix  CODE STATUS: FULL CODE  Other plan as per orders.  I have spent a total of 65 min on this admission extra time was taken to discuss care of with PCCM. who will see her in consult appreciate their help.   Lubertha Leite 01/18/2014, 11:15 PM

## 2014-01-18 NOTE — ED Notes (Signed)
Respiratory at bedside.

## 2014-01-18 NOTE — ED Notes (Signed)
MD at bedside. 

## 2014-01-18 NOTE — ED Notes (Signed)
Pt. reports asthma attack with dry cough , wheezing and body aches onset yesterday unrelieved by MDI.

## 2014-01-18 NOTE — ED Provider Notes (Signed)
CSN: [id number]     Arrival date & time 01/18/14  1954 History   First MD Initiated Contact with Patient 01/18/14 2154     Chief Complaint  Patient presents with  . Asthma     (Consider location/radiation/quality/duration/timing/severity/associated sxs/prior Treatment) HPI Patient reports she has a history of asthma, COPD, sarcoidosis with pulmonary involvement, and congestive heart failure She states she was seen at the urgent care on March 25 when she started having a mild exacerbation. She was placed on Levaquin and prednisone. She states however last week she started having worsening difficulty breathing which got much worse yesterday. She states she has a cough and sometimes coughs up clear mucus. She denies fever, swelling in her legs. She states her chest feels tight. She states yesterday her nebulizer stopped helping her shortness of breath and wheezing. She states she is chronically on oxygen at 3 L per minute nasal cannula at rest and when she exerts her self she needs 4 L per minute per nasal cannula. She states however the last 2 days she's had to bump up her oxygen to 6 L a minute because of shortness of breath. She states she's never been on a ventilator. She was last admitted over 4 months ago.  She does use CPAP at night.   PCP Dr Asa Lente Pulmonary Dr [name] Cardiology Dr Ron Parker  Past Medical History  Diagnosis Date  . Hypertension   . Hyperlipidemia   . Chronic headache   . Fibromyalgia     daily narcotics  . Anxiety     hx chronic BZ use, stopped 07/2010  . Anemia   . Pulmonary sarcoidosis     unimpressive CT chest 2011  . Colonic polyp   . GERD (gastroesophageal reflux disease)   . ALLERGIC RHINITIS   . Asthma   . CHF (congestive heart failure)     Diastolic with fluid overload, May, 2012, LVEF 60%  . Morbid obesity   . Depression   . Panic attacks   . Diabetes mellitus   . COPD (chronic obstructive pulmonary disease)     on home O2, moderate airflow  obstruction, suspect d/t emphysema  . Obesity   . Elevated LFTs 09/2011  . Ovarian cyst   . Chronic back pain   . Sleep apnea      CPAP   Past Surgical History  Procedure Laterality Date  . Polypectomy  2011  . Lumbar microdiscectomy  07/06/2011    R L4-5, stern  . Back surgery    . Carpal tunnel release    . Steroid spinal injections    . Hysteroscopy w/d&c N/A 03/25/2013    Procedure: DILATATION AND CURETTAGE /HYSTEROSCOPY;  Surgeon: [name], MD;  Location: [facility];  Service: Gynecology;  Laterality: N/A;  . Dilation and curettage of uterus     Family History  Problem Relation Age of Onset  . Hypertension Mother   . Emphysema Father   . Hypertension Father   . Stomach cancer Father   . Allergies Brother   . Hypertension Brother   . Stomach cancer Brother   . Heart disease Mother   . Heart disease Father   . Heart disease Brother     died age 64 sudden death/MI   History  Substance Use Topics  . Smoking status: Current Every Day Smoker -- 0.25 packs/day for 29 years    Types: Cigarettes  . Smokeless tobacco: Never Used     Comment: Resumed smoking September 2013 --2-3 CIGS DAILY  AND E-CIG  . Alcohol Use: Yes     Comment: occasionally   On home oxygen 3-4 lpm Mark Pt on  disabilty  OB History   Grav Para Term Preterm Abortions TAB SAB Ect Mult Living                 Review of Systems  All other systems reviewed and are negative.     Allergies  Simvastatin; Sulfamethoxazole-trimethoprim; Zolpidem tartrate; Azithromycin; Doxycycline; Latex; Metformin and related; Metronidazole; Metolazone; and Ciprofloxacin  Home Medications   Prior to Admission medications   Medication Sig Start Date End Date Taking? Authorizing Provider  albuterol (PROVENTIL HFA;VENTOLIN HFA) 108 (90 BASE) MCG/ACT inhaler Inhale 2 puffs into the lungs every 6 (six) hours as needed for wheezing or shortness of breath.   Yes Historical Provider, MD  albuterol (PROVENTIL) (2.5 MG/3ML)  0.083% nebulizer solution Take 2.5 mg by nebulization every 6 (six) hours as needed for wheezing.   Yes Historical Provider, MD  ALPRAZolam Duanne Moron) 1 MG tablet Take 1 mg by mouth 3 (three) times daily.   Yes Historical Provider, MD  aspirin EC 81 MG tablet Take 1 tablet (81 mg total) by mouth daily. 06/06/13  Yes Rowe Clack, MD  budesonide-formoterol (SYMBICORT) 160-4.5 MCG/ACT inhaler Inhale 2 puffs into the lungs 2 (two) times daily. 09/04/12  Yes Reyne Dumas, MD  cetirizine (ZYRTEC) 1 MG/ML syrup Take 10 mLs (10 mg total) by mouth daily. 01/07/14  Yes Rowe Clack, MD  esomeprazole (NEXIUM) 40 MG capsule Take 1 capsule (40 mg total) by mouth 2 (two) times daily. 10/02/13  Yes Rowe Clack, MD  fluticasone (FLONASE) 50 MCG/ACT nasal spray Place 2 sprays into both nostrils daily as needed for rhinitis. 08/01/13  Yes Rowe Clack, MD  glimepiride (AMARYL) 1 MG tablet Take 1 mg by mouth daily before breakfast.   Yes Historical Provider, MD  hydrOXYzine (ATARAX/VISTARIL) 10 MG tablet Take 1 tablet (10 mg total) by mouth 3 (three) times daily as needed for itching. 10/08/13  Yes Rowe Clack, MD  insulin aspart (NOVOLOG) 100 UNIT/ML injection Inject 0-10 Units into the skin 3 (three) times daily with meals as needed. CBG less than 140: 0 units 141-200: 2 units 201-250: 4 units 251-300: 6 units 301-350: 8 units 351-400: 10 units 400+: call MD   Yes Historical Provider, MD  insulin glargine (LANTUS) 100 UNIT/ML injection Inject 20-40 Units into the skin See admin instructions. Takes 20 units every evening. Also takes 20 units in the morning only if sugar is over 200 07/21/13  Yes Rowe Clack, MD  Liraglutide (VICTOZA) 18 MG/3ML SOPN Inject 18 mg into the skin daily.   Yes Historical Provider, MD  Oxycodone HCl 10 MG TABS Take 1 tablet (10 mg total) by mouth 3 (three) times daily as needed (pain). May fill not more than once every 30 days. 01/01/14  Yes Rowe Clack, MD  potassium chloride SA (K-DUR,KLOR-CON) 20 MEQ tablet Take 2 tablets (40 mEq total) by mouth daily. 09/07/13  Yes Charlynne Cousins, MD  sertraline (ZOLOFT) 100 MG tablet Take 100 mg by mouth daily.   Yes Historical Provider, MD  spironolactone (ALDACTONE) 25 MG tablet Take 1 tablet (25 mg total) by mouth 2 (two) times daily. 07/04/13  Yes Rowe Clack, MD  tiotropium (SPIRIVA) 18 MCG inhalation capsule Place 18 mcg into inhaler and inhale daily.   Yes Historical Provider, MD  tiZANidine (ZANAFLEX) 4 MG tablet Take 4  mg by mouth every 8 (eight) hours as needed for muscle spasms.   Yes Historical Provider, MD  torsemide (DEMADEX) 100 MG tablet Take 100 mg by mouth 2 (two) times daily.   Yes Historical Provider, MD  varenicline (CHANTIX STARTING MONTH PAK) 0.5 MG X 11 & 1 MG X 42 tablet Take one 0.5 mg tablet by mouth once daily for 3 days, then increase to one 0.5 mg tablet twice daily for 4 days, then increase to one 1 mg tablet twice daily. 01/14/14  Yes Rowe Clack, MD  Blood Glucose Monitoring Suppl (ONE TOUCH ULTRA 2) W/DEVICE KIT Use as directed  Dx 250.00 12/17/13   Rowe Clack, MD  glucose blood (ONE TOUCH ULTRA TEST) test strip Use to check blood sugars twice a day Dx 250.00 12/17/13   Rowe Clack, MD  [facility] DELICA LANCETS 96P MISC Use to help check blood sugars twice a day Dx 250.00 12/17/13   Rowe Clack, MD   BP 127/62  Pulse 103  Temp(Src) 97.5 F (36.4 C) (Oral)  Resp 20  SpO2 100%  LMP 10/16/2012  Vital signs normal except tachycardia  Physical Exam  Nursing note and vitals reviewed. Constitutional: She is oriented to person, place, and time. She appears well-developed and well-nourished.  Non-toxic appearance. She does not appear ill. No distress.  obese  HENT:  Head: Normocephalic and atraumatic.  Right Ear: External ear normal.  Left Ear: External ear normal.  Nose: Nose normal. No mucosal edema or rhinorrhea.   Mouth/Throat: Oropharynx is clear and moist and mucous membranes are normal. No dental abscesses or uvula swelling.  Eyes: Conjunctivae and EOM are normal. Pupils are equal, round, and reactive to light.  Neck: Normal range of motion and full passive range of motion without pain. Neck supple.  Cardiovascular: Normal rate, regular rhythm and normal heart sounds.  Exam reveals no gallop and no friction rub.   No murmur heard. Pulmonary/Chest: Accessory muscle usage present. Tachypnea noted. She is in respiratory distress. She has wheezes. She has no rhonchi. She has no rales. She exhibits no tenderness and no crepitus.  Pt seen after second nebulizer, still has diffuse wheezing, can still talk in sentences but is SOB.   Abdominal: Soft. Normal appearance and bowel sounds are normal. She exhibits no distension. There is no tenderness. There is no rebound and no guarding.  Musculoskeletal: Normal range of motion. She exhibits no edema and no tenderness.  Moves all extremities well.   Neurological: She is alert and oriented to person, place, and time. She has normal strength. No cranial nerve deficit.  Skin: Skin is warm, dry and intact. No rash noted. No erythema. No pallor.  Psychiatric: She has a normal mood and affect. Her speech is normal and behavior is normal. Her mood appears not anxious.    ED Course  Procedures (including critical care time)  Medications  vancomycin (VANCOCIN) IVPB 1000 mg/200 mL premix (1,000 mg Intravenous New Bag/Given 01/18/14 2242)  albuterol (PROVENTIL) (2.5 MG/3ML) 0.083% nebulizer solution 5 mg (5 mg Nebulization Given 01/18/14 2012)  albuterol (PROVENTIL) (2.5 MG/3ML) 0.083% nebulizer solution 5 mg (5 mg Nebulization Given 01/18/14 2148)  ipratropium (ATROVENT) nebulizer solution 0.5 mg (0.5 mg Nebulization Given 01/18/14 2148)  albuterol (PROVENTIL,VENTOLIN) solution continuous neb (15 mg/hr Nebulization Given 01/18/14 2230)  ipratropium (ATROVENT) nebulizer  solution 0.5 mg (0.5 mg Nebulization Given 01/18/14 2230)  methylPREDNISolone sodium succinate (SOLU-MEDROL) 125 mg/2 mL injection 125 mg (125 mg Intravenous Given 01/18/14  2242)  piperacillin-tazobactam (ZOSYN) IVPB 3.375 g (3.375 g Intravenous New Bag/Given 01/18/14 2242)  ketorolac (TORADOL) 30 MG/ML injection 30 mg (30 mg Intravenous Given 01/18/14 2241)    Patient presents with exacerbation of her asthma/COPD, she continues to smoke. She possibly has a pneumonia seen on her chest x-ray today. She has had cough with clear sputum production but no fever. She was on Levaquin about 3 weeks ago. She was started on antibiotics. She was placed on BiPAP because she states she was feeling tired and to help her with her work of breathing. She is agreeable for admission.  22:43 Dr Roel Cluck, admit to step down, team 10    Labs Review  Results for orders placed during the hospital encounter of 01/18/14  CBC WITH DIFFERENTIAL      Result Value Ref Range   WBC 8.1  4.0 - 10.5 K/uL   RBC 5.08  3.87 - 5.11 MIL/uL   Hemoglobin 13.1  12.0 - 15.0 g/dL   HCT 39.1  36.0 - 46.0 %   MCV 77.0 (*) 78.0 - 100.0 fL   MCH 25.8 (*) 26.0 - 34.0 pg   MCHC 33.5  30.0 - 36.0 g/dL   RDW 18.7 (*) 11.5 - 15.5 %   Platelets 158  150 - 400 K/uL   Neutrophils Relative % 73  43 - 77 %   Neutro Abs 6.0  1.7 - 7.7 K/uL   Lymphocytes Relative 19  12 - 46 %   Lymphs Abs 1.5  0.7 - 4.0 K/uL   Monocytes Relative 6  3 - 12 %   Monocytes Absolute 0.5  0.1 - 1.0 K/uL   Eosinophils Relative 2  0 - 5 %   Eosinophils Absolute 0.2  0.0 - 0.7 K/uL   Basophils Relative 0  0 - 1 %   Basophils Absolute 0.0  0.0 - 0.1 K/uL  COMPREHENSIVE METABOLIC PANEL      Result Value Ref Range   Sodium 133 (*) 137 - 147 mEq/L   Potassium 3.5 (*) 3.7 - 5.3 mEq/L   Chloride 93 (*) 96 - 112 mEq/L   CO2 26  19 - 32 mEq/L   Glucose, Bld 200 (*) 70 - 99 mg/dL   BUN 14  6 - 23 mg/dL   Creatinine, Ser 0.95  0.50 - 1.10 mg/dL   Calcium 9.2  8.4 - 10.5  mg/dL   Total Protein 7.4  6.0 - 8.3 g/dL   Albumin 3.4 (*) 3.5 - 5.2 g/dL   AST 27  0 - 37 U/L   ALT 39 (*) 0 - 35 U/L   Alkaline Phosphatase 122 (*) 39 - 117 U/L   Total Bilirubin 0.4  0.3 - 1.2 mg/dL   GFR calc non Af Amer 71 (*) >90 mL/min   GFR calc Af Amer 82 (*) >90 mL/min  PRO B NATRIURETIC PEPTIDE      Result Value Ref Range   Pro B Natriuretic peptide (BNP) 802.5 (*) 0 - 125 pg/mL  TROPONIN I      Result Value Ref Range   Troponin I <0.30  <0.30 ng/mL  CBG MONITORING, ED      Result Value Ref Range   Glucose-Capillary 220 (*) 70 - 99 mg/dL   Comment 1 Notify RN    I-STAT ARTERIAL BLOOD GAS, ED      Result Value Ref Range   pH, Arterial 7.382  7.350 - 7.450   pCO2 arterial 48.1 (*) 35.0 - 45.0  mmHg   pO2, Arterial 141.0 (*) 80.0 - 100.0 mmHg   Bicarbonate 28.6 (*) 20.0 - 24.0 mEq/L   TCO2 30  0 - 100 mmol/L   O2 Saturation 99.0     Acid-Base Excess 3.0 (*) 0.0 - 2.0 mmol/L   Patient temperature 98.6 F     Collection site RADIAL, ALLEN'S TEST ACCEPTABLE     Drawn by Operator     Sample type ARTERIAL     Laboratory interpretation all normal except mild hypokalemia, hyperglycemia, hypokalemia ABG shows no acute respiratory failure     Imaging Review Dg Chest 2 View  01/18/2014   ONE CLINICAL DATA: History of congestion and nonproductive cough; history of COPD, diabetes, and congestive heart failure  EXAM: CHEST  2 VIEW  COMPARISON:  DG CHEST 2 VIEW dated 12/17/2013  FINDINGS: The lungs are well-expanded. The interstitial markings are increased bilaterally which is not entirely new. However, there are coarse lung markings in the retrocardiac region on the lateral film which are more conspicuous than in the past. There is no pleural effusion. The cardiopericardial silhouette is top-normal in size. The central pulmonary vascularity is prominent but stable. The mediastinum is normal in width. There is no pneumothorax or pneumomediastinum. The observed portions of the bony  thorax exhibit no acute abnormalities.  IMPRESSION: There are findings consistent with pulmonary fibrosis. However, the interstitial markings are increased over and above that seen on the previous study and confluent density in the retrocardiac region likely on the left is suspicious for pneumonia. There is no definite evidence of CHF. Followup films following therapy are recommended to assure clearing. Chest CT scanning may be ultimately needed.   Electronically Signed   By: David  Martinique   On: 01/18/2014 21:12     EKG Interpretation None      MDM   Final diagnoses:  Asthma exacerbation with COPD (chronic obstructive pulmonary disease)  Community acquired pneumonia   Plan admitted  Rolland Porter, MD, [place]   CRITICAL CARE Performed by: Jerriyah Louis L Nettye Flegal Total critical care time: 35 min Critical care time was exclusive of separately billable procedures and treating other patients. Critical care was necessary to treat or prevent imminent or life-threatening deterioration. Critical care was time spent personally by me on the following activities: development of treatment plan with patient and/or surrogate as well as nursing, discussions with consultants, evaluation of patient's response to treatment, examination of patient, obtaining history from patient or surrogate, ordering and performing treatments and interventions, ordering and review of laboratory studies, ordering and review of radiographic studies, pulse oximetry and re-evaluation of patient's condition.       Janice Norrie, MD 01/18/14 2329

## 2014-01-19 ENCOUNTER — Inpatient Hospital Stay (HOSPITAL_COMMUNITY): Payer: Medicare Other

## 2014-01-19 ENCOUNTER — Ambulatory Visit: Payer: Self-pay | Admitting: Internal Medicine

## 2014-01-19 ENCOUNTER — Encounter (HOSPITAL_COMMUNITY): Payer: Self-pay | Admitting: *Deleted

## 2014-01-19 DIAGNOSIS — J441 Chronic obstructive pulmonary disease with (acute) exacerbation: Secondary | ICD-10-CM

## 2014-01-19 DIAGNOSIS — J9610 Chronic respiratory failure, unspecified whether with hypoxia or hypercapnia: Secondary | ICD-10-CM

## 2014-01-19 DIAGNOSIS — I369 Nonrheumatic tricuspid valve disorder, unspecified: Secondary | ICD-10-CM

## 2014-01-19 DIAGNOSIS — J449 Chronic obstructive pulmonary disease, unspecified: Secondary | ICD-10-CM

## 2014-01-19 DIAGNOSIS — J9620 Acute and chronic respiratory failure, unspecified whether with hypoxia or hypercapnia: Secondary | ICD-10-CM

## 2014-01-19 DIAGNOSIS — J189 Pneumonia, unspecified organism: Secondary | ICD-10-CM

## 2014-01-19 DIAGNOSIS — J438 Other emphysema: Secondary | ICD-10-CM

## 2014-01-19 DIAGNOSIS — J45901 Unspecified asthma with (acute) exacerbation: Secondary | ICD-10-CM

## 2014-01-19 DIAGNOSIS — IMO0001 Reserved for inherently not codable concepts without codable children: Secondary | ICD-10-CM

## 2014-01-19 DIAGNOSIS — E1165 Type 2 diabetes mellitus with hyperglycemia: Secondary | ICD-10-CM

## 2014-01-19 DIAGNOSIS — I5033 Acute on chronic diastolic (congestive) heart failure: Secondary | ICD-10-CM

## 2014-01-19 DIAGNOSIS — E876 Hypokalemia: Secondary | ICD-10-CM

## 2014-01-19 LAB — URINE CULTURE

## 2014-01-19 LAB — CBC WITH DIFFERENTIAL/PLATELET
BASOS ABS: 0 10*3/uL (ref 0.0–0.1)
BASOS PCT: 0 % (ref 0–1)
EOS PCT: 1 % (ref 0–5)
Eosinophils Absolute: 0.1 10*3/uL (ref 0.0–0.7)
HCT: 39.9 % (ref 36.0–46.0)
Hemoglobin: 13.5 g/dL (ref 12.0–15.0)
LYMPHS PCT: 8 % — AB (ref 12–46)
Lymphs Abs: 0.7 10*3/uL (ref 0.7–4.0)
MCH: 26.3 pg (ref 26.0–34.0)
MCHC: 33.8 g/dL (ref 30.0–36.0)
MCV: 77.8 fL — ABNORMAL LOW (ref 78.0–100.0)
Monocytes Absolute: 0.2 10*3/uL (ref 0.1–1.0)
Monocytes Relative: 2 % — ABNORMAL LOW (ref 3–12)
Neutro Abs: 7.5 10*3/uL (ref 1.7–7.7)
Neutrophils Relative %: 89 % — ABNORMAL HIGH (ref 43–77)
PLATELETS: 157 10*3/uL (ref 150–400)
RBC: 5.13 MIL/uL — ABNORMAL HIGH (ref 3.87–5.11)
RDW: 19 % — AB (ref 11.5–15.5)
WBC: 8.4 10*3/uL (ref 4.0–10.5)

## 2014-01-19 LAB — RESPIRATORY VIRUS PANEL
ADENOVIRUS: NOT DETECTED
INFLUENZA A H3: NOT DETECTED
INFLUENZA A: NOT DETECTED
INFLUENZA B 1: NOT DETECTED
Influenza A H1: NOT DETECTED
Metapneumovirus: NOT DETECTED
PARAINFLUENZA 1 A: NOT DETECTED
PARAINFLUENZA 2 A: NOT DETECTED
Parainfluenza 3: NOT DETECTED
Respiratory Syncytial Virus A: NOT DETECTED
Respiratory Syncytial Virus B: NOT DETECTED
Rhinovirus: NOT DETECTED

## 2014-01-19 LAB — URINALYSIS, ROUTINE W REFLEX MICROSCOPIC
BILIRUBIN URINE: NEGATIVE
Glucose, UA: 100 mg/dL — AB
HGB URINE DIPSTICK: NEGATIVE
Ketones, ur: NEGATIVE mg/dL
Leukocytes, UA: NEGATIVE
Nitrite: NEGATIVE
Protein, ur: NEGATIVE mg/dL
SPECIFIC GRAVITY, URINE: 1.016 (ref 1.005–1.030)
Urobilinogen, UA: 0.2 mg/dL (ref 0.0–1.0)
pH: 6 (ref 5.0–8.0)

## 2014-01-19 LAB — RAPID URINE DRUG SCREEN, HOSP PERFORMED
Amphetamines: NOT DETECTED
Barbiturates: NOT DETECTED
Benzodiazepines: NOT DETECTED
COCAINE: NOT DETECTED
Opiates: POSITIVE — AB
Tetrahydrocannabinol: NOT DETECTED

## 2014-01-19 LAB — COMPREHENSIVE METABOLIC PANEL
ALT: 41 U/L — ABNORMAL HIGH (ref 0–35)
AST: 28 U/L (ref 0–37)
Albumin: 3.6 g/dL (ref 3.5–5.2)
Alkaline Phosphatase: 115 U/L (ref 39–117)
BUN: 15 mg/dL (ref 6–23)
CO2: 22 mEq/L (ref 19–32)
CREATININE: 1.02 mg/dL (ref 0.50–1.10)
Calcium: 9 mg/dL (ref 8.4–10.5)
Chloride: 92 mEq/L — ABNORMAL LOW (ref 96–112)
GFR calc Af Amer: 75 mL/min — ABNORMAL LOW (ref >90)
GFR, EST NON AFRICAN AMERICAN: 65 mL/min — AB (ref >90)
Glucose, Bld: 306 mg/dL — ABNORMAL HIGH (ref 70–99)
Potassium: 3.8 mEq/L (ref 3.7–5.3)
Sodium: 133 mEq/L — ABNORMAL LOW (ref 137–147)
Total Bilirubin: 0.6 mg/dL (ref 0.3–1.2)
Total Protein: 7.6 g/dL (ref 6.0–8.3)

## 2014-01-19 LAB — HEMOGLOBIN A1C
HEMOGLOBIN A1C: 8.7 % — AB (ref <5.7)
Mean Plasma Glucose: 203 mg/dL — ABNORMAL HIGH (ref <117)

## 2014-01-19 LAB — GLUCOSE, CAPILLARY
GLUCOSE-CAPILLARY: 333 mg/dL — AB (ref 70–99)
Glucose-Capillary: 329 mg/dL — ABNORMAL HIGH (ref 70–99)
Glucose-Capillary: 332 mg/dL — ABNORMAL HIGH (ref 70–99)
Glucose-Capillary: 269 mg/dL — ABNORMAL HIGH (ref 70–99)

## 2014-01-19 LAB — LEGIONELLA ANTIGEN, URINE: LEGIONELLA ANTIGEN, URINE: NEGATIVE

## 2014-01-19 LAB — D-DIMER, QUANTITATIVE: D-Dimer, Quant: 0.54 ug/mL-FEU — ABNORMAL HIGH (ref 0.00–0.48)

## 2014-01-19 LAB — MRSA PCR SCREENING: MRSA by PCR: NEGATIVE

## 2014-01-19 LAB — STREP PNEUMONIAE URINARY ANTIGEN: Strep Pneumo Urinary Antigen: NEGATIVE

## 2014-01-19 LAB — HIV ANTIBODY (ROUTINE TESTING W REFLEX): HIV 1&2 Ab, 4th Generation: NONREACTIVE

## 2014-01-19 LAB — TROPONIN I: Troponin I: <0.3 ng/mL (ref <0.30)

## 2014-01-19 MED ORDER — VANCOMYCIN HCL 10 G IV SOLR
1250.0000 mg | Freq: Two times a day (BID) | INTRAVENOUS | Status: DC
  Filled 2014-01-19: qty 1250

## 2014-01-19 MED ORDER — ALBUTEROL SULFATE (2.5 MG/3ML) 0.083% IN NEBU
5.0000 mg | INHALATION_SOLUTION | RESPIRATORY_TRACT | Status: DC | PRN
  Administered 2014-01-20: 5 mg via RESPIRATORY_TRACT
  Filled 2014-01-19: qty 6

## 2014-01-19 MED ORDER — LORATADINE 10 MG PO TABS
10.0000 mg | ORAL_TABLET | Freq: Every day | ORAL | Status: DC
  Administered 2014-01-19 – 2014-01-25 (×7): 10 mg via ORAL
  Filled 2014-01-19 (×7): qty 1

## 2014-01-19 MED ORDER — LORAZEPAM 2 MG/ML IJ SOLN
0.5000 mg | Freq: Four times a day (QID) | INTRAMUSCULAR | Status: DC | PRN
  Filled 2014-01-19: qty 1

## 2014-01-19 MED ORDER — SODIUM CHLORIDE 0.9 % IJ SOLN
3.0000 mL | Freq: Two times a day (BID) | INTRAMUSCULAR | Status: DC
  Administered 2014-01-19 – 2014-01-20 (×4): 3 mL via INTRAVENOUS

## 2014-01-19 MED ORDER — GLIMEPIRIDE 1 MG PO TABS
1.0000 mg | ORAL_TABLET | Freq: Every day | ORAL | Status: DC
  Administered 2014-01-19: 1 mg via ORAL
  Filled 2014-01-19 (×2): qty 1

## 2014-01-19 MED ORDER — FLUTICASONE PROPIONATE 50 MCG/ACT NA SUSP: 2.0000 | Freq: Every day | NASAL | Status: DC | PRN

## 2014-01-19 MED ORDER — SODIUM CHLORIDE 0.9 % IJ SOLN
10.0000 mL | Freq: Two times a day (BID) | INTRAMUSCULAR | Status: DC
  Administered 2014-01-19: 20 mL
  Administered 2014-01-20 – 2014-01-21 (×3): 10 mL

## 2014-01-19 MED ORDER — TORSEMIDE 100 MG PO TABS
100.0000 mg | ORAL_TABLET | Freq: Two times a day (BID) | ORAL | Status: DC
  Administered 2014-01-19 – 2014-01-25 (×13): 100 mg via ORAL
  Filled 2014-01-19 (×15): qty 1

## 2014-01-19 MED ORDER — BUDESONIDE-FORMOTEROL FUMARATE 160-4.5 MCG/ACT IN AERO
2.0000 | INHALATION_SPRAY | Freq: Two times a day (BID) | RESPIRATORY_TRACT | Status: DC
  Filled 2014-01-19: qty 6

## 2014-01-19 MED ORDER — DEXTROSE 5 % IV SOLN
500.0000 mg | INTRAVENOUS | Status: DC
  Administered 2014-01-19 – 2014-01-21 (×3): 500 mg via INTRAVENOUS
  Filled 2014-01-19 (×3): qty 500

## 2014-01-19 MED ORDER — SERTRALINE HCL 100 MG PO TABS
100.0000 mg | ORAL_TABLET | Freq: Every day | ORAL | Status: DC
  Administered 2014-01-19 – 2014-01-25 (×7): 100 mg via ORAL
  Filled 2014-01-19 (×7): qty 1

## 2014-01-19 MED ORDER — INSULIN ASPART 100 UNIT/ML ~~LOC~~ SOLN
0.0000 [IU] | Freq: Three times a day (TID) | SUBCUTANEOUS | Status: DC
  Administered 2014-01-19: 15 [IU] via SUBCUTANEOUS
  Administered 2014-01-19: 11 [IU] via SUBCUTANEOUS
  Administered 2014-01-19 – 2014-01-20 (×2): 15 [IU] via SUBCUTANEOUS

## 2014-01-19 MED ORDER — POTASSIUM CHLORIDE CRYS ER 20 MEQ PO TBCR
40.0000 meq | EXTENDED_RELEASE_TABLET | Freq: Two times a day (BID) | ORAL | Status: DC
  Administered 2014-01-19 – 2014-01-25 (×14): 40 meq via ORAL
  Filled 2014-01-19 (×17): qty 2

## 2014-01-19 MED ORDER — SODIUM CHLORIDE 0.9 % IJ SOLN: 3.0000 mL | INTRAMUSCULAR | Status: DC | PRN

## 2014-01-19 MED ORDER — WHITE PETROLATUM GEL
Status: AC
  Filled 2014-01-19: qty 5

## 2014-01-19 MED ORDER — OXYCODONE HCL 5 MG PO TABS
10.0000 mg | ORAL_TABLET | Freq: Three times a day (TID) | ORAL | Status: DC | PRN
  Administered 2014-01-19 – 2014-01-24 (×14): 10 mg via ORAL
  Filled 2014-01-19 (×16): qty 2

## 2014-01-19 MED ORDER — METHYLPREDNISOLONE SODIUM SUCC 125 MG IJ SOLR
60.0000 mg | Freq: Four times a day (QID) | INTRAMUSCULAR | Status: DC
  Administered 2014-01-19 – 2014-01-21 (×9): 60 mg via INTRAVENOUS
  Filled 2014-01-19 (×6): qty 0.96
  Filled 2014-01-19: qty 2
  Filled 2014-01-19 (×7): qty 0.96

## 2014-01-19 MED ORDER — ALPRAZOLAM 0.5 MG PO TABS
1.0000 mg | ORAL_TABLET | Freq: Three times a day (TID) | ORAL | Status: DC
  Administered 2014-01-19 – 2014-01-23 (×13): 1 mg via ORAL
  Filled 2014-01-19 (×13): qty 2

## 2014-01-19 MED ORDER — IPRATROPIUM BROMIDE 0.02 % IN SOLN: 0.5000 mg | Freq: Four times a day (QID) | RESPIRATORY_TRACT | Status: DC

## 2014-01-19 MED ORDER — GLIMEPIRIDE 2 MG PO TABS
2.0000 mg | ORAL_TABLET | Freq: Every day | ORAL | Status: DC
  Administered 2014-01-20: 2 mg via ORAL
  Filled 2014-01-19 (×2): qty 1

## 2014-01-19 MED ORDER — INSULIN ASPART 100 UNIT/ML ~~LOC~~ SOLN
0.0000 [IU] | Freq: Every day | SUBCUTANEOUS | Status: DC
  Administered 2014-01-19: 4 [IU] via SUBCUTANEOUS
  Administered 2014-01-19: 5 [IU] via SUBCUTANEOUS

## 2014-01-19 MED ORDER — INSULIN GLARGINE 100 UNIT/ML ~~LOC~~ SOLN
25.0000 [IU] | Freq: Two times a day (BID) | SUBCUTANEOUS | Status: DC
  Administered 2014-01-19: 25 [IU] via SUBCUTANEOUS
  Filled 2014-01-19 (×3): qty 0.25

## 2014-01-19 MED ORDER — METHYLPREDNISOLONE SODIUM SUCC 125 MG IJ SOLR
60.0000 mg | Freq: Two times a day (BID) | INTRAMUSCULAR | Status: DC
  Filled 2014-01-19 (×2): qty 0.96

## 2014-01-19 MED ORDER — TIZANIDINE HCL 4 MG PO TABS
4.0000 mg | ORAL_TABLET | Freq: Three times a day (TID) | ORAL | Status: DC | PRN
  Filled 2014-01-19 (×2): qty 1

## 2014-01-19 MED ORDER — ASPIRIN EC 81 MG PO TBEC
81.0000 mg | DELAYED_RELEASE_TABLET | Freq: Every day | ORAL | Status: DC
  Administered 2014-01-19 – 2014-01-25 (×7): 81 mg via ORAL
  Filled 2014-01-19 (×7): qty 1

## 2014-01-19 MED ORDER — MORPHINE SULFATE 2 MG/ML IJ SOLN
2.0000 mg | INTRAMUSCULAR | Status: DC | PRN
  Administered 2014-01-19 – 2014-01-23 (×10): 2 mg via INTRAVENOUS
  Filled 2014-01-19 (×11): qty 1

## 2014-01-19 MED ORDER — ENOXAPARIN SODIUM 60 MG/0.6ML ~~LOC~~ SOLN
60.0000 mg | SUBCUTANEOUS | Status: DC
Start: 2014-01-19 — End: 2014-01-25
  Administered 2014-01-19 – 2014-01-25 (×7): 60 mg via SUBCUTANEOUS
  Filled 2014-01-19 (×7): qty 0.6

## 2014-01-19 MED ORDER — VANCOMYCIN HCL 10 G IV SOLR
1500.0000 mg | Freq: Once | INTRAVENOUS | Status: DC
  Filled 2014-01-19: qty 1500

## 2014-01-19 MED ORDER — SODIUM CHLORIDE 0.9 % IJ SOLN
10.0000 mL | INTRAMUSCULAR | Status: DC | PRN
  Administered 2014-01-22: 10 mL
  Administered 2014-01-24: 20 mL
  Administered 2014-01-25: 10 mL

## 2014-01-19 MED ORDER — PIPERACILLIN-TAZOBACTAM 3.375 G IVPB
3.3750 g | Freq: Three times a day (TID) | INTRAVENOUS | Status: DC
  Filled 2014-01-19 (×2): qty 50

## 2014-01-19 MED ORDER — IPRATROPIUM-ALBUTEROL 0.5-2.5 (3) MG/3ML IN SOLN
3.0000 mL | Freq: Four times a day (QID) | RESPIRATORY_TRACT | Status: DC
  Administered 2014-01-19 – 2014-01-20 (×6): 3 mL via RESPIRATORY_TRACT
  Filled 2014-01-19 (×7): qty 3

## 2014-01-19 MED ORDER — IOHEXOL 350 MG/ML SOLN
100.0000 mL | Freq: Once | INTRAVENOUS | Status: AC | PRN
  Administered 2014-01-19: 100 mL via INTRAVENOUS

## 2014-01-19 MED ORDER — SODIUM CHLORIDE 0.9 % IV SOLN
250.0000 mL | INTRAVENOUS | Status: DC | PRN
Start: 2014-01-19 — End: 2014-01-22

## 2014-01-19 MED ORDER — DEXTROSE 5 % IV SOLN
1.0000 g | INTRAVENOUS | Status: DC
  Administered 2014-01-19 – 2014-01-24 (×6): 1 g via INTRAVENOUS
  Filled 2014-01-19 (×7): qty 10

## 2014-01-19 MED ORDER — BUDESONIDE 0.5 MG/2ML IN SUSP
0.5000 mg | Freq: Two times a day (BID) | RESPIRATORY_TRACT | Status: DC
  Administered 2014-01-19 – 2014-01-25 (×11): 0.5 mg via RESPIRATORY_TRACT
  Filled 2014-01-19 (×17): qty 2

## 2014-01-19 MED ORDER — SPIRONOLACTONE 25 MG PO TABS
25.0000 mg | ORAL_TABLET | Freq: Two times a day (BID) | ORAL | Status: DC
  Administered 2014-01-19 – 2014-01-25 (×13): 25 mg via ORAL
  Filled 2014-01-19 (×16): qty 1

## 2014-01-19 MED ORDER — PIPERACILLIN-TAZOBACTAM 3.375 G IVPB 30 MIN: 3.3750 g | Freq: Three times a day (TID) | INTRAVENOUS | Status: DC

## 2014-01-19 MED ORDER — NICOTINE 14 MG/24HR TD PT24
14.0000 mg | MEDICATED_PATCH | Freq: Every day | TRANSDERMAL | Status: DC
  Administered 2014-01-19 – 2014-01-25 (×7): 14 mg via TRANSDERMAL
  Filled 2014-01-19 (×7): qty 1

## 2014-01-19 MED ORDER — INSULIN GLARGINE 100 UNIT/ML ~~LOC~~ SOLN
20.0000 [IU] | Freq: Two times a day (BID) | SUBCUTANEOUS | Status: DC
  Administered 2014-01-19 (×2): 20 [IU] via SUBCUTANEOUS
  Filled 2014-01-19 (×3): qty 0.2

## 2014-01-19 MED ORDER — PANTOPRAZOLE SODIUM 40 MG PO TBEC
40.0000 mg | DELAYED_RELEASE_TABLET | Freq: Every day | ORAL | Status: DC
  Administered 2014-01-19 – 2014-01-25 (×7): 40 mg via ORAL
  Filled 2014-01-19 (×6): qty 1

## 2014-01-19 NOTE — Progress Notes (Signed)
Spoke with primary RN not to use PICC until I can come do an exchange.  It's in the too short.

## 2014-01-19 NOTE — Progress Notes (Signed)
Inpatient Diabetes Program Recommendations  AACE/ADA: New Consensus Statement on Inpatient Glycemic Control (2013)  Target Ranges:  Prepandial:   less than 140 mg/dL      Peak postprandial:   less than 180 mg/dL (1-2 hours)      Critically ill patients:  140 - 180 mg/dL   Results for MISAKI, SOZIO (MRN [id number]) as of 01/19/2014 10:24  Ref. Range 01/18/2014 20:17 01/19/2014 00:58 01/19/2014 07:42  Glucose-Capillary Latest Range: 70-99 mg/dL 220 (H) 333 (H) 329 (H)   Diabetes history: DM2 Outpatient Diabetes medications: Lantus 20 units QHS, Lantus 20 units QAM (if CBG greater than 200 mg/dl), Novolog 0-10 units TID, Victoza 18 mg daily, Amaryl 1mg  QAM Current orders for Inpatient glycemic control: Lantus 20 units BID, Novolog 0-20 units AC, Novolog 0-5 units HS, Amaryl 1 mg QAM  Inpatient Diabetes Program Recommendations Insulin - Basal: Please consider increasing Lantus to 24 units BID. Insulin - Meal Coverage: Please consider ordering Novolog 5 units TID with meals for meal coverage.  Thanks, Barnie Alderman, RN, MSN, CCRN Diabetes Coordinator Inpatient Diabetes Program [phone] (Team Pager) [phone] ([facility] office) [phone] [facility] office)

## 2014-01-19 NOTE — Progress Notes (Signed)
Utilization review completed.  

## 2014-01-19 NOTE — Progress Notes (Signed)
ANTIBIOTIC CONSULT NOTE - INITIAL  Pharmacy Consult for Vancocin Indication: rule out pneumonia  Allergies  Allergen Reactions  . Simvastatin Other (See Comments)    Severe leg pain and burning  . Sulfamethoxazole-Trimethoprim Nausea And Vomiting    Causes Projectile Vomiting  . Zolpidem Tartrate Other (See Comments)    Chest pain  . Azithromycin Other (See Comments)    Resistant to med  . Doxycycline Other (See Comments)    Resistant to med  . Latex Itching and Rash    Also burning sensations  . Metformin And Related Diarrhea    Severe diarrhea  . Metronidazole Nausea And Vomiting  . Metolazone Other (See Comments)    Cramps,pain  . Ciprofloxacin Nausea Only    Patient Measurements: Height: 5' 8.5" (174 cm) Weight: 297 lb 2.9 oz (134.8 kg) IBW/kg (Calculated) : 65.06  Vital Signs: Temp: 97.5 F (36.4 C) (04/26 2004) Temp src: Oral (04/26 2004) BP: 117/55 mmHg (04/26 2347) Pulse Rate: 105 (04/26 2347)  Labs:  Recent Labs  01/18/14 2034  WBC 8.1  HGB 13.1  PLT 158  CREATININE 0.95   Estimated Creatinine Clearance: 108.6 ml/min (by C-G formula based on Cr of 0.95).   Microbiology: No results found for this or any previous visit (from the past 720 hour(s)).  Medical History: Past Medical History  Diagnosis Date  . Hypertension   . Hyperlipidemia   . Chronic headache   . Fibromyalgia     daily narcotics  . Anxiety     hx chronic BZ use, stopped 07/2010  . Anemia   . Pulmonary sarcoidosis     unimpressive CT chest 2011  . Colonic polyp   . GERD (gastroesophageal reflux disease)   . ALLERGIC RHINITIS   . Asthma   . CHF (congestive heart failure)     Diastolic with fluid overload, May, 2012, LVEF 60%  . Morbid obesity   . Depression   . Panic attacks   . Diabetes mellitus   . COPD (chronic obstructive pulmonary disease)     on home O2, moderate airflow obstruction, suspect d/t emphysema  . Obesity   . Elevated LFTs 09/2011  . Ovarian cyst   .  Chronic back pain   . Sleep apnea      CPAP    Medications:  Prescriptions prior to admission  Medication Sig Dispense Refill  . albuterol (PROVENTIL HFA;VENTOLIN HFA) 108 (90 BASE) MCG/ACT inhaler Inhale 2 puffs into the lungs every 6 (six) hours as needed for wheezing or shortness of breath.      Marland Kitchen albuterol (PROVENTIL) (2.5 MG/3ML) 0.083% nebulizer solution Take 2.5 mg by nebulization every 6 (six) hours as needed for wheezing.      Marland Kitchen ALPRAZolam (XANAX) 1 MG tablet Take 1 mg by mouth 3 (three) times daily.      Marland Kitchen aspirin EC 81 MG tablet Take 1 tablet (81 mg total) by mouth daily.  150 tablet  2  . budesonide-formoterol (SYMBICORT) 160-4.5 MCG/ACT inhaler Inhale 2 puffs into the lungs 2 (two) times daily.  1 Inhaler  2  . cetirizine (ZYRTEC) 1 MG/ML syrup Take 10 mLs (10 mg total) by mouth daily.  480 mL  1  . esomeprazole (NEXIUM) 40 MG capsule Take 1 capsule (40 mg total) by mouth 2 (two) times daily.  60 capsule  5  . fluticasone (FLONASE) 50 MCG/ACT nasal spray Place 2 sprays into both nostrils daily as needed for rhinitis.  16 g  2  . glimepiride (  AMARYL) 1 MG tablet Take 1 mg by mouth daily before breakfast.      . hydrOXYzine (ATARAX/VISTARIL) 10 MG tablet Take 1 tablet (10 mg total) by mouth 3 (three) times daily as needed for itching.  60 tablet  1  . insulin aspart (NOVOLOG) 100 UNIT/ML injection Inject 0-10 Units into the skin 3 (three) times daily with meals as needed. CBG less than 140: 0 units 141-200: 2 units 201-250: 4 units 251-300: 6 units 301-350: 8 units 351-400: 10 units 400+: call MD      . insulin glargine (LANTUS) 100 UNIT/ML injection Inject 20-40 Units into the skin See admin instructions. Takes 20 units every evening. Also takes 20 units in the morning only if sugar is over 200      . Liraglutide (VICTOZA) 18 MG/3ML SOPN Inject 18 mg into the skin daily.      . Oxycodone HCl 10 MG TABS Take 1 tablet (10 mg total) by mouth 3 (three) times daily as needed  (pain). May fill not more than once every 30 days.  90 tablet  0  . potassium chloride SA (K-DUR,KLOR-CON) 20 MEQ tablet Take 40 mEq by mouth 2 (two) times daily.      . sertraline (ZOLOFT) 100 MG tablet Take 100 mg by mouth daily.      Marland Kitchen spironolactone (ALDACTONE) 25 MG tablet Take 1 tablet (25 mg total) by mouth 2 (two) times daily.  60 tablet  5  . tiotropium (SPIRIVA) 18 MCG inhalation capsule Place 18 mcg into inhaler and inhale daily.      Marland Kitchen tiZANidine (ZANAFLEX) 4 MG tablet Take 4 mg by mouth every 8 (eight) hours as needed for muscle spasms.      Marland Kitchen torsemide (DEMADEX) 100 MG tablet Take 100 mg by mouth 2 (two) times daily.      . varenicline (CHANTIX STARTING MONTH PAK) 0.5 MG X 11 & 1 MG X 42 tablet Take one 0.5 mg tablet by mouth once daily for 3 days, then increase to one 0.5 mg tablet twice daily for 4 days, then increase to one 1 mg tablet twice daily.  53 tablet  0  . Blood Glucose Monitoring Suppl (ONE TOUCH ULTRA 2) W/DEVICE KIT Use as directed  Dx 250.00  1 each  0  . glucose blood (ONE TOUCH ULTRA TEST) test strip Use to check blood sugars twice a day Dx 250.00  60 each  11  . ONETOUCH DELICA LANCETS 34L MISC Use to help check blood sugars twice a day Dx 250.00  60 each  11   Scheduled:  . ALPRAZolam  1 mg Oral TID  . aspirin EC  81 mg Oral Daily  . budesonide-formoterol  2 puff Inhalation BID  . cetirizine  10 mg Oral Daily  . enoxaparin (LOVENOX) injection  40 mg Subcutaneous Q24H  . glimepiride  1 mg Oral QAC breakfast  . insulin aspart  0-20 Units Subcutaneous TID WC  . insulin aspart  0-5 Units Subcutaneous QHS  . insulin glargine  20-40 Units Subcutaneous BID  . ipratropium  0.5 mg Nebulization QID  . methylPREDNISolone sodium succinate  60 mg Intravenous Q12H  . pantoprazole  40 mg Oral Daily  . piperacillin-tazobactam (ZOSYN)  IV  3.375 g Intravenous Q8H  . potassium chloride SA  40 mEq Oral BID  . sertraline  100 mg Oral Daily  . sodium chloride  3 mL  Intravenous Q12H  . spironolactone  25 mg Oral BID  .  torsemide  100 mg Oral BID  . vancomycin  1,250 mg Intravenous Q12H  . vancomycin  1,500 mg Intravenous Once    Assessment: 41BO female w/ complicated pulmonary hx c/o "asthma attack" w/ cough, wheezing, and body aches, recently completed course of Levaquin for COPD exacerbation, now w/ questionable new infiltrate on CXR, to begin broad-spectrum ABX.  Goal of Therapy:  Vancomycin trough level 15-20 mcg/ml  Plan:  Rec'd vanc 1g in ED; will give additional vancomycin 1557m IV x1 to complete load of 2.5g then begin vancomycin 15028mIV Q12H and monitor CBC, Cx, levels prn.  VeWynona NeatPharmD, BCPS  01/19/2014,12:46 AM

## 2014-01-19 NOTE — Consult Note (Addendum)
PULMONARY / CRITICAL CARE MEDICINE   Name: [name] MRN: [id number] DOB: Mar 30, 1966    ADMISSION DATE:  01/18/2014 CONSULTATION DATE:  01/19/14  REFERRING MD :  Dr. Roel Cluck PRIMARY SERVICE: Hospitalist  CHIEF COMPLAINT:  dyspnea  BRIEF PATIENT DESCRIPTION: Morbidly obese patient with known COPD + OSA on home CPAP and oxygen, diastolic HF, possible pulm HTN, coming in with progressive dyspnea likely COPD exac due to viral illness. Active smoker. 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  SIGNIFICANT EVENTS / STUDIES:  4/27 >> BiPAP initiated  LINES / TUBES:   CULTURES: Pending  ANTIBIOTICS: Vancomycin 4/26 Zosyn 4/26 Azithromycin 4/27 >>  HISTORY OF PRESENT ILLNESS:  9 morbidly obese female, active smoker, with known COPD, OSA on CPAP, home oxygen, possible pulm HTN, diastolic HF. Pulmonary consult ordered for COPD exacerbation. About 5 days ago started having N/V, diarrhea, runny nose, chills, then yesterday started having increasing dyspnea, multiple joint pains that "feels like the flu", sore throat, wheezing. Normally she has minimal amount of cough with white sputum, the past few days the amount of sputum has increased but still clear. No fever. She was exposed to a few people who were sick with "bronchitis" about 2-3 weeks ago. Had a recent travel to another state a few weeks ago. No leg swelling, no leg tenderness. A known active smoker. Denies EtOH, denies drug abuse. In the ED was given Zosyn + vanco + solumedrol + breathing treatments and placed on BiPAP, somewhat feels slightly better. CXR no definite acute infiltrate. Trop negative.  PAST MEDICAL HISTORY :  Past Medical History  Diagnosis Date  . Hypertension   . Hyperlipidemia   . Chronic headache   . Fibromyalgia     daily narcotics  . Anxiety     hx chronic BZ use, stopped 07/2010  . Anemia   . Pulmonary sarcoidosis     unimpressive CT chest 2011  . Colonic polyp   . GERD (gastroesophageal  reflux disease)   . ALLERGIC RHINITIS   . Asthma   . CHF (congestive heart failure)     Diastolic with fluid overload, May, 2012, LVEF 60%  . Morbid obesity   . Depression   . Panic attacks   . Diabetes mellitus   . COPD (chronic obstructive pulmonary disease)     on home O2, moderate airflow obstruction, suspect d/t emphysema  . Obesity   . Elevated LFTs 09/2011  . Ovarian cyst   . Chronic back pain   . Sleep apnea      CPAP   Past Surgical History  Procedure Laterality Date  . Polypectomy  2011  . Lumbar microdiscectomy  07/06/2011    R L4-5, stern  . Back surgery    . Carpal tunnel release    . Steroid spinal injections    . Hysteroscopy w/d&c N/A 03/25/2013    Procedure: DILATATION AND CURETTAGE /HYSTEROSCOPY;  Surgeon: [name], MD;  Location: [facility];  Service: Gynecology;  Laterality: N/A;  . Dilation and curettage of uterus     Prior to Admission medications   Medication Sig Start Date End Date Taking? Authorizing Provider  albuterol (PROVENTIL HFA;VENTOLIN HFA) 108 (90 BASE) MCG/ACT inhaler Inhale 2 puffs into the lungs every 6 (six) hours as needed for wheezing or shortness of breath.   Yes Historical Provider, MD  albuterol (PROVENTIL) (2.5 MG/3ML) 0.083% nebulizer solution Take 2.5 mg by nebulization every 6 (six) hours as needed for wheezing.   Yes Historical Provider, MD  ALPRAZolam (  XANAX) 1 MG tablet Take 1 mg by mouth 3 (three) times daily.   Yes Historical Provider, MD  aspirin EC 81 MG tablet Take 1 tablet (81 mg total) by mouth daily. 06/06/13  Yes Rowe Clack, MD  budesonide-formoterol (SYMBICORT) 160-4.5 MCG/ACT inhaler Inhale 2 puffs into the lungs 2 (two) times daily. 09/04/12  Yes Reyne Dumas, MD  cetirizine (ZYRTEC) 1 MG/ML syrup Take 10 mLs (10 mg total) by mouth daily. 01/07/14  Yes Rowe Clack, MD  esomeprazole (NEXIUM) 40 MG capsule Take 1 capsule (40 mg total) by mouth 2 (two) times daily. 10/02/13  Yes Rowe Clack, MD   fluticasone (FLONASE) 50 MCG/ACT nasal spray Place 2 sprays into both nostrils daily as needed for rhinitis. 08/01/13  Yes Rowe Clack, MD  glimepiride (AMARYL) 1 MG tablet Take 1 mg by mouth daily before breakfast.   Yes Historical Provider, MD  hydrOXYzine (ATARAX/VISTARIL) 10 MG tablet Take 1 tablet (10 mg total) by mouth 3 (three) times daily as needed for itching. 10/08/13  Yes Rowe Clack, MD  insulin aspart (NOVOLOG) 100 UNIT/ML injection Inject 0-10 Units into the skin 3 (three) times daily with meals as needed. CBG less than 140: 0 units 141-200: 2 units 201-250: 4 units 251-300: 6 units 301-350: 8 units 351-400: 10 units 400+: call MD   Yes Historical Provider, MD  insulin glargine (LANTUS) 100 UNIT/ML injection Inject 20-40 Units into the skin See admin instructions. Takes 20 units every evening. Also takes 20 units in the morning only if sugar is over 200 07/21/13  Yes Rowe Clack, MD  Liraglutide (VICTOZA) 18 MG/3ML SOPN Inject 18 mg into the skin daily.   Yes Historical Provider, MD  Oxycodone HCl 10 MG TABS Take 1 tablet (10 mg total) by mouth 3 (three) times daily as needed (pain). May fill not more than once every 30 days. 01/01/14  Yes Rowe Clack, MD  potassium chloride SA (K-DUR,KLOR-CON) 20 MEQ tablet Take 40 mEq by mouth 2 (two) times daily. 09/07/13  Yes Charlynne Cousins, MD  sertraline (ZOLOFT) 100 MG tablet Take 100 mg by mouth daily.   Yes Historical Provider, MD  spironolactone (ALDACTONE) 25 MG tablet Take 1 tablet (25 mg total) by mouth 2 (two) times daily. 07/04/13  Yes Rowe Clack, MD  tiotropium (SPIRIVA) 18 MCG inhalation capsule Place 18 mcg into inhaler and inhale daily.   Yes Historical Provider, MD  tiZANidine (ZANAFLEX) 4 MG tablet Take 4 mg by mouth every 8 (eight) hours as needed for muscle spasms.   Yes Historical Provider, MD  torsemide (DEMADEX) 100 MG tablet Take 100 mg by mouth 2 (two) times daily.   Yes Historical  Provider, MD  varenicline (CHANTIX STARTING MONTH PAK) 0.5 MG X 11 & 1 MG X 42 tablet Take one 0.5 mg tablet by mouth once daily for 3 days, then increase to one 0.5 mg tablet twice daily for 4 days, then increase to one 1 mg tablet twice daily. 01/14/14  Yes Rowe Clack, MD  Blood Glucose Monitoring Suppl (ONE TOUCH ULTRA 2) W/DEVICE KIT Use as directed  Dx 250.00 12/17/13   Rowe Clack, MD  glucose blood (ONE TOUCH ULTRA TEST) test strip Use to check blood sugars twice a day Dx 250.00 12/17/13   Rowe Clack, MD  [facility] DELICA LANCETS 37J MISC Use to help check blood sugars twice a day Dx 250.00 12/17/13   Rowe Clack, MD  Allergies  Allergen Reactions  . Simvastatin Other (See Comments)    Severe leg pain and burning  . Sulfamethoxazole-Trimethoprim Nausea And Vomiting    Causes Projectile Vomiting  . Zolpidem Tartrate Other (See Comments)    Chest pain  . Azithromycin Other (See Comments)    Resistant to med  . Doxycycline Other (See Comments)    Resistant to med  . Latex Itching and Rash    Also burning sensations  . Metformin And Related Diarrhea    Severe diarrhea  . Metronidazole Nausea And Vomiting  . Metolazone Other (See Comments)    Cramps,pain  . Ciprofloxacin Nausea Only    FAMILY HISTORY:  Family History  Problem Relation Age of Onset  . Hypertension Mother   . Emphysema Father   . Hypertension Father   . Stomach cancer Father   . Allergies Brother   . Hypertension Brother   . Stomach cancer Brother   . Heart disease Mother   . Heart disease Father   . Heart disease Brother     died age 37 sudden death/MI   SOCIAL HISTORY:  reports that she has been smoking Cigarettes.  She has a 7.25 pack-year smoking history. She has never used smokeless tobacco. She reports that she drinks alcohol. She reports that she uses illicit drugs (Cocaine and Marijuana).  REVIEW OF SYSTEMS:  No fever, no abd pain, no melena, no hematochezia, no  weight changes, no night sweats, no hemoptysis, no focal weakness, no numbness, no acute rash, no ecchymosis, no vision changes, no facial pain, no hearing changes.  SUBJECTIVE:   VITAL SIGNS: Temp:  [97.5 F (36.4 C)] 97.5 F (36.4 C) (04/26 2004) Pulse Rate:  [99-105] 105 (04/26 2347) Resp:  [20-26] 26 (04/26 2347) BP: (115-127)/(42-64) 117/55 mmHg (04/26 2347) SpO2:  [97 %-100 %] 100 % (04/26 2347) HEMODYNAMICS:   VENTILATOR SETTINGS:   INTAKE / OUTPUT: Intake/Output   None     PHYSICAL EXAMINATION: General:  Awake, alert, oriented x 4, anxious, cooperative with BiPAP Neuro:  No focal weakness, no facial droop, speech normal, EOM full and equal HEENT:  Atraumatic, trachea midline, thick neck, no enlarged neck LN, BiPAP mask on face Cardiovascular:  RRR, no loud murmur Lungs:  Decreased breath sounds bilaterally, has generalized expiratory wheeze, no definite rales Abdomen:  Soft, nontender, no guarding Musculoskeletal:  No deformity, no calf tenderness, trace edema Skin:  No acute rash, no sacral wound  LABS:  CBC  Recent Labs Lab 01/18/14 2034  WBC 8.1  HGB 13.1  HCT 39.1  PLT 158   Coag's No results found for this basename: APTT, INR,  in the last 168 hours BMET  Recent Labs Lab 01/18/14 2034  NA 133*  K 3.5*  CL 93*  CO2 26  BUN 14  CREATININE 0.95  GLUCOSE 200*   Electrolytes  Recent Labs Lab 01/18/14 2034  CALCIUM 9.2   Sepsis Markers No results found for this basename: LATICACIDVEN, PROCALCITON, O2SATVEN,  in the last 168 hours ABG  Recent Labs Lab 01/18/14 2319  PHART 7.382  PCO2ART 48.1*  PO2ART 141.0*   Liver Enzymes  Recent Labs Lab 01/18/14 2034  AST 27  ALT 39*  ALKPHOS 122*  BILITOT 0.4  ALBUMIN 3.4*   Cardiac Enzymes  Recent Labs Lab 01/18/14 2034  TROPONINI <0.30  PROBNP 802.5*   Glucose  Recent Labs Lab 01/18/14 2017  GLUCAP 220*    Imaging Dg Chest 2 View  01/18/2014   ONE CLINICAL  DATA:  History of congestion and nonproductive cough; history of COPD, diabetes, and congestive heart failure  EXAM: CHEST  2 VIEW  COMPARISON:  DG CHEST 2 VIEW dated 12/17/2013  FINDINGS: The lungs are well-expanded. The interstitial markings are increased bilaterally which is not entirely new. However, there are coarse lung markings in the retrocardiac region on the lateral film which are more conspicuous than in the past. There is no pleural effusion. The cardiopericardial silhouette is top-normal in size. The central pulmonary vascularity is prominent but stable. The mediastinum is normal in width. There is no pneumothorax or pneumomediastinum. The observed portions of the bony thorax exhibit no acute abnormalities.  IMPRESSION: There are findings consistent with pulmonary fibrosis. However, the interstitial markings are increased over and above that seen on the previous study and confluent density in the retrocardiac region likely on the left is suspicious for pneumonia. There is no definite evidence of CHF. Followup films following therapy are recommended to assure clearing. Chest CT scanning may be ultimately needed.   Electronically Signed   By: David  Martinique   On: 01/18/2014 21:12    ASSESSMENT / PLAN:  PULMONARY A: Acute COPD exacerbation in an active smoker, likely triggered by viral illness, requiring BiPAP Less likely bacterial PNA at this point Known OSA/OHS on CPAP and oxygen at home Smoker Possible Pulm HTN likey Group 2 and 3 Old history of sarcoid, likely inactive P:   BiPAP support and wean as tolerated then can transition to CPAP once better Scheduled duoneb + pulmicort + Solumedrol and switch to prednisone once patient better and can take PO safely Currently will not use Zosyn and vanco as no firm evidence of bacterial PNA right now, will use Azithromycin instead for the increasing amount of whitish sputum in COPD exac. If patient develops high fever or green/yellow sputum, will consider  broadening abx coverage. Pending sputum culture Will check respiratory virus panel Strongly advised to stop smoking, will place on nicotine patch Blood sugar control Avoid volume overload  TODAY'S SUMMARY: 59 F morbidly obese, admitted for COPD exac likely due to viral illness, requiring BiPAP support. Known OSA/OHS on home CPAP and oxygen. Active smoker. 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  I have personally obtained a history, examined the patient, evaluated laboratory and imaging results, formulated the assessment and plan and placed orders. CRITICAL CARE: The patient is critically ill with multiple organ systems failure and requires high complexity decision making for assessment and support, frequent evaluation and titration of therapies, application of advanced monitoring technologies and extensive interpretation of multiple databases. Critical Care Time devoted to patient care services described in this note is 45 minutes.    Pulmonary and [place] Pager: [phone]  01/19/2014, 12:33 AM

## 2014-01-19 NOTE — Progress Notes (Signed)
[facility] PROGRESS NOTE  [name] NGE:[id number] DOB: 1966/03/14 DOA: 01/18/2014 PCP: Gwendolyn Grant, MD  Assessment/Plan: 1. Acute hypoxemic respiratory failure -Patient presented in respiratory distress, breathing 30 breaths per minute requiring BiPAP. -Likely secondary to COPD exacerbation precipitated from underlying infectious process. -CT scan of lungs ordered to assess for other possibilities such as PE contributing to respiratory failure.  -Pulmonary medicine consulted  2. Chronic obstructive pulmonary disease exacerbation -Patient with history of tobacco abuse, presented in respiratory failure. Probable underlying infectious process precipitating COPD exacerbation. -Continue IV steroids with Solu-Medrol 60 mg IV every 6 hours, scheduled nebs, empiric IV antibiotic therapy.  3. Community acquire pneumonia versus viral pneumonia -Influenza negative, chest x-ray did not reveal obvious infiltrate -Continue empiric IV antibiotic therapy with IV ceftriaxone and azithromycin -Followup on blood cultures  4. Insulin-dependent diabetes mellitus -Patient's blood sugars elevated I suspect caused by IV steroids -Will increase her insulin glargine to 25 units subcutaneous twice a day -Accu-Cheks q. a.c. and each bedtime with sliding scale coverage  5. Obstructive sleep apnea -NPPV each bedtime  6. IV access -Patient difficult IV access, PICC line ordered  7. Chronic diastolic congestive heart failure -Appears compensated clinically -Continue torsemide 100 mg by mouth twice a day and spironolactone 25 mg by mouth daily    Code Status: Full code Family Communication: Disposition Plan: Continue close monitoring in the step down unit   Consultants:  Pulmonary critical care medicine  Procedures:  Pending CT scan  Antibiotics:  Ceftriaxone 1 g IV every 24 hours  Azithromycin 500 mg IV every 24 hours  HPI/Subjective: Patient is a pleasant  48 year old female with a past medical history morbid obesity, obstructive sleep apnea, ongoing tobacco abuse, who was admitted to the medicine service on 12/18/2013 presenting with complaints of cough, shortness of breath, found to be in respiratory failure. She was admitted to the step down unit where she was started on broad-spectrum IV antibiotic therapy, IV steroids, nebs and supplemental oxygen. Symptoms felt to be secondary to COPD likely triggered from underlying infectious process.  Objective: Filed Vitals:   01/19/14 1600  BP: 103/43  Pulse: 86  Temp: 97.4 F (36.3 C)  Resp: 22    Intake/Output Summary (Last 24 hours) at 01/19/14 1646 Last data filed at 01/19/14 1025  Gross per 24 hour  Intake    300 ml  Output      0 ml  Net    300 ml   Filed Weights   01/18/14 2347  Weight: 134.8 kg (297 lb 2.9 oz)    Exam:   General:  Patient appears anxious, mild distress, reports ongoing shortness of breath and cough  Cardiovascular: Tachycardic, regular rate rhythm normal S1-S2  Respiratory: Diminished breath sounds bilaterally, bilateral expiratory wheezes, positive rhonchi, on supplemental oxygen  Abdomen: Soft nontender nondistended  Extremities: No edema  Musculoskeletal: Present range of motion to all extremities no joint pain  Data Reviewed: Basic Metabolic Panel:  Recent Labs Lab 01/18/14 2034 01/19/14 0220  NA 133* 133*  K 3.5* 3.8  CL 93* 92*  CO2 26 22  GLUCOSE 200* 306*  BUN 14 15  CREATININE 0.95 1.02  CALCIUM 9.2 9.0   Liver Function Tests:  Recent Labs Lab 01/18/14 2034 01/19/14 0220  AST 27 28  ALT 39* 41*  ALKPHOS 122* 115  BILITOT 0.4 0.6  PROT 7.4 7.6  ALBUMIN 3.4* 3.6   No results found for this basename: LIPASE, AMYLASE,  in the last 168 hours No  results found for this basename: AMMONIA,  in the last 168 hours CBC:  Recent Labs Lab 01/18/14 2034 01/19/14 0220  WBC 8.1 8.4  NEUTROABS 6.0 7.5  HGB 13.1 13.5  HCT 39.1 39.9   MCV 77.0* 77.8*  PLT 158 157   Cardiac Enzymes:  Recent Labs Lab 01/18/14 2034 01/19/14 0220  TROPONINI <0.30 <0.30   BNP (last 3 results)  Recent Labs  11/16/13 2349 12/09/13 1811 01/18/14 2034  PROBNP 53.5 24.6 802.5*   CBG:  Recent Labs Lab 01/18/14 2017 01/19/14 0058 01/19/14 0742 01/19/14 1201  GLUCAP 220* 333* 329* 332*    Recent Results (from the past 240 hour(s))  MRSA PCR SCREENING     Status: None   Collection Time    01/19/14  2:14 AM      Result Value Ref Range Status   MRSA by PCR NEGATIVE  NEGATIVE Final   Comment:            The GeneXpert MRSA Assay (FDA     approved for NASAL specimens     only), is one component of a     comprehensive MRSA colonization     surveillance program. It is not     intended to diagnose MRSA     infection nor to guide or     monitor treatment for     MRSA infections.     Studies: Dg Chest 2 View  01/18/2014   ONE CLINICAL DATA: History of congestion and nonproductive cough; history of COPD, diabetes, and congestive heart failure  EXAM: CHEST  2 VIEW  COMPARISON:  DG CHEST 2 VIEW dated 12/17/2013  FINDINGS: The lungs are well-expanded. The interstitial markings are increased bilaterally which is not entirely new. However, there are coarse lung markings in the retrocardiac region on the lateral film which are more conspicuous than in the past. There is no pleural effusion. The cardiopericardial silhouette is top-normal in size. The central pulmonary vascularity is prominent but stable. The mediastinum is normal in width. There is no pneumothorax or pneumomediastinum. The observed portions of the bony thorax exhibit no acute abnormalities.  IMPRESSION: There are findings consistent with pulmonary fibrosis. However, the interstitial markings are increased over and above that seen on the previous study and confluent density in the retrocardiac region likely on the left is suspicious for pneumonia. There is no definite evidence  of CHF. Followup films following therapy are recommended to assure clearing. Chest CT scanning may be ultimately needed.   Electronically Signed   By: David  Martinique   On: 01/18/2014 21:12   Dg Chest Port 1 View  01/19/2014   CLINICAL DATA:  Right PICC line placement  EXAM: PORTABLE CHEST - 1 VIEW  COMPARISON:  None.  FINDINGS: Right subclavian PICC line catheter tip is at the level of the distal right innominate vein. This could be advanced 12 cm to the SVC RA junction.  Mild cardiomegaly with slight interstitial edema diffusely. Bibasilar atelectasis versus scarring noted. No new focal pneumonia, collapse or consolidation. No large effusion or pneumothorax. Trachea is midline.  IMPRESSION: Right PICC line tip at the right innominate vein level, this could be advanced 12 cm to the SVC RA junction  Mild interstitial edema pattern, compatible with early CHF  Bibasilar atelectasis versus scarring   Electronically Signed   By: Daryll Brod M.D.   On: 01/19/2014 12:02    Scheduled Meds: . ALPRAZolam  1 mg Oral TID  . aspirin EC  81 mg  Oral Daily  . azithromycin  500 mg Intravenous Q24H  . budesonide (PULMICORT) nebulizer solution  0.5 mg Nebulization BID  . cefTRIAXone (ROCEPHIN)  IV  1 g Intravenous Q24H  . enoxaparin (LOVENOX) injection  60 mg Subcutaneous Q24H  . glimepiride  1 mg Oral QAC breakfast  . insulin aspart  0-20 Units Subcutaneous TID WC  . insulin aspart  0-5 Units Subcutaneous QHS  . insulin glargine  20 Units Subcutaneous BID  . ipratropium-albuterol  3 mL Nebulization Q6H  . loratadine  10 mg Oral Daily  . methylPREDNISolone (SOLU-MEDROL) injection  60 mg Intravenous Q6H  . nicotine  14 mg Transdermal Daily  . pantoprazole  40 mg Oral Daily  . potassium chloride SA  40 mEq Oral BID  . sertraline  100 mg Oral Daily  . sodium chloride  10-40 mL Intracatheter Q12H  . sodium chloride  3 mL Intravenous Q12H  . spironolactone  25 mg Oral BID  . torsemide  100 mg Oral BID    Continuous Infusions:   Active Problems:   HYPERTENSION   OBSTRUCTIVE SLEEP APNEA   Diabetes type 2, uncontrolled   Tobacco abuse   Acute-on-chronic respiratory failure   Chronic diastolic CHF (congestive heart failure)   Asthma exacerbation in COPD   Hypokalemia   COPD exacerbation   HCAP (healthcare-associated pneumonia)    Time spent: 35 minutes    [facility] Pager [phone] 7PM-7AM, please contact night-coverage at [email or web address], password [facility] 01/19/2014, 4:46 PM  LOS: 1 day

## 2014-01-19 NOTE — Progress Notes (Signed)
Attempted to do a PICC exchange due to first PICC being too short per the radiologist.  Unable to get guidewire to thread into basilic vein after cutting the existing PICC.  Ended up removing old PICC.   Accessed right cephalic vein without difficulty however also unable to thread the guidewire.   Informed pt I was not able to obtain access and was going to inform her nurse.   Her primary RN informed MD who ordered a PICC via IR.   I called IR and spoke with Heather.  Gave her the needed information.

## 2014-01-19 NOTE — Progress Notes (Signed)
Pt influenza negative.  Droplet precautions d/c'd. 

## 2014-01-19 NOTE — ED Notes (Signed)
Pt transported on BiPAP (16/6 30% FiO2) from ED A07 to 4Z66 with no complications.  Pt is currently very anxious with a respiratory rate of 32+.  RN and RT taking over care are aware.

## 2014-01-19 NOTE — Progress Notes (Signed)
Peripherally Inserted Central Catheter/Midline Placement  The IV Nurse has discussed with the patient and/or persons authorized to consent for the patient, the purpose of this procedure and the potential benefits and risks involved with this procedure.  The benefits include less needle sticks, lab draws from the catheter and patient may be discharged home with the catheter.  Risks include, but not limited to, infection, bleeding, blood clot (thrombus formation), and puncture of an artery; nerve damage and irregular heat beat.  Alternatives to this procedure were also discussed.  PICC/Midline Placement Documentation        Henderson Baltimore 01/19/2014, 11:40 AM

## 2014-01-19 NOTE — Progress Notes (Signed)
  Echocardiogram 2D Echocardiogram has been performed.  Valinda Hoar 01/19/2014, 10:24 AM

## 2014-01-19 NOTE — Consult Note (Addendum)
PULMONARY / CRITICAL CARE MEDICINE   Name: [name] MRN: [id number] DOB: 1966-09-15    ADMISSION DATE:  01/18/2014 CONSULTATION DATE:  01/19/14  REFERRING MD :  Dr. Roel Cluck PRIMARY SERVICE: Hospitalist  CHIEF COMPLAINT:  dyspnea  BRIEF PATIENT DESCRIPTION: Morbidly obese patient with known COPD + OSA on home CPAP and oxygen, diastolic HF, possible pulm HTN, coming in with progressive dyspnea likely COPD exac due to viral illness. Active smoker. 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  SIGNIFICANT EVENTS / STUDIES:  4/27 >> BiPAP initiated  LINES / TUBES: PIV  CULTURES: Pending  ANTIBIOTICS: Vancomycin 4/26 Zosyn 4/26 Azithromycin 4/27 >>  SUBJECTIVE: SOB overnight required BiPAP, now resolved.   VITAL SIGNS: Temp:  [97.5 F (36.4 C)-98.6 F (37 C)] 98.6 F (37 C) (04/27 1221) Pulse Rate:  [82-117] 82 (04/27 1002) Resp:  [19-32] 19 (04/27 1002) BP: (115-143)/(42-67) 136/61 mmHg (04/27 1002) SpO2:  [97 %-100 %] 98 % (04/27 1010) FiO2 (%):  [30 %] 30 % (04/27 1010) Weight:  [297 lb 2.9 oz (134.8 kg)] 297 lb 2.9 oz (134.8 kg) (04/26 2347) HEMODYNAMICS:   VENTILATOR SETTINGS: Vent Mode:  [-]  FiO2 (%):  [30 %] 30 % INTAKE / OUTPUT: Intake/Output     04/26 0701 - 04/27 0700 04/27 0701 - 04/28 0700   IV Piggyback 250    Total Intake(mL/kg) 250 (1.9)    Net +250          Urine Occurrence  400 x     PHYSICAL EXAMINATION: General:  Awake, alert, oriented x 4, anxious, off BiPAP Neuro:  No focal weakness, no facial droop, speech normal, EOM full and equal HEENT:  Atraumatic, trachea midline, thick neck, no enlarged neck LN, BiPAP mask on face Cardiovascular:  RRR, no loud murmur Lungs:  Decreased breath sounds bilaterally, has generalized expiratory wheeze, no definite rales Abdomen:  Soft, nontender, no guarding Musculoskeletal:  No deformity, no calf tenderness, trace edema Skin:  No acute rash, no sacral wound  LABS:  CBC  Recent Labs Lab  01/18/14 2034 01/19/14 0220  WBC 8.1 8.4  HGB 13.1 13.5  HCT 39.1 39.9  PLT 158 157   Coag's No results found for this basename: APTT, INR,  in the last 168 hours BMET  Recent Labs Lab 01/18/14 2034 01/19/14 0220  NA 133* 133*  K 3.5* 3.8  CL 93* 92*  CO2 26 22  BUN 14 15  CREATININE 0.95 1.02  GLUCOSE 200* 306*   Electrolytes  Recent Labs Lab 01/18/14 2034 01/19/14 0220  CALCIUM 9.2 9.0   Sepsis Markers No results found for this basename: LATICACIDVEN, PROCALCITON, O2SATVEN,  in the last 168 hours ABG  Recent Labs Lab 01/18/14 2319  PHART 7.382  PCO2ART 48.1*  PO2ART 141.0*   Liver Enzymes  Recent Labs Lab 01/18/14 2034 01/19/14 0220  AST 27 28  ALT 39* 41*  ALKPHOS 122* 115  BILITOT 0.4 0.6  ALBUMIN 3.4* 3.6   Cardiac Enzymes  Recent Labs Lab 01/18/14 2034 01/19/14 0220  TROPONINI <0.30 <0.30  PROBNP 802.5*  --    Glucose  Recent Labs Lab 01/18/14 2017 01/19/14 0058 01/19/14 0742 01/19/14 1201  GLUCAP 220* 333* 329* 332*    Imaging Dg Chest 2 View  01/18/2014   ONE CLINICAL DATA: History of congestion and nonproductive cough; history of COPD, diabetes, and congestive heart failure  EXAM: CHEST  2 VIEW  COMPARISON:  DG CHEST 2 VIEW dated 12/17/2013  FINDINGS: The lungs are  well-expanded. The interstitial markings are increased bilaterally which is not entirely new. However, there are coarse lung markings in the retrocardiac region on the lateral film which are more conspicuous than in the past. There is no pleural effusion. The cardiopericardial silhouette is top-normal in size. The central pulmonary vascularity is prominent but stable. The mediastinum is normal in width. There is no pneumothorax or pneumomediastinum. The observed portions of the bony thorax exhibit no acute abnormalities.  IMPRESSION: There are findings consistent with pulmonary fibrosis. However, the interstitial markings are increased over and above that seen on the  previous study and confluent density in the retrocardiac region likely on the left is suspicious for pneumonia. There is no definite evidence of CHF. Followup films following therapy are recommended to assure clearing. Chest CT scanning may be ultimately needed.   Electronically Signed   By: David  [place]   On: 01/18/2014 21:12   Dg Chest Port 1 View  01/19/2014   CLINICAL DATA:  Right PICC line placement  EXAM: PORTABLE CHEST - 1 VIEW  COMPARISON:  None.  FINDINGS: Right subclavian PICC line catheter tip is at the level of the distal right innominate vein. This could be advanced 12 cm to the SVC RA junction.  Mild cardiomegaly with slight interstitial edema diffusely. Bibasilar atelectasis versus scarring noted. No new focal pneumonia, collapse or consolidation. No large effusion or pneumothorax. Trachea is midline.  IMPRESSION: Right PICC line tip at the right innominate vein level, this could be advanced 12 cm to the SVC RA junction  Mild interstitial edema pattern, compatible with early CHF  Bibasilar atelectasis versus scarring   Electronically Signed   By: Daryll Brod M.D.   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 - Scheduled duoneb + pulmicort + Solumedrol and switch to prednisone once patient better and can take PO safely. - Changed to roc/zith, would continue that. If patient develops high fever or green/yellow sputum, will consider broadening abx coverage. - Pending sputum culture. - Respiratory virus panel pending. - Strongly advised to stop smoking, will place on nicotine patch and loose wt. - Blood sugar control. - Avoid volume overload.  TODAY'S SUMMARY: 57 F morbidly  obese, admitted for COPD exac likely due to viral illness, off BiPAP. Known OSA/OHS on home CPAP and oxygen. Active smoker. 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  I have personally obtained a history, examined the patient, evaluated laboratory and imaging results, formulated the assessment and plan and placed orders.  Rush Farmer, M.D. [facility] Pulmonary/Critical Care Medicine. Pager: [phone]. After hours pager: [phone].

## 2014-01-20 DIAGNOSIS — F17200 Nicotine dependence, unspecified, uncomplicated: Secondary | ICD-10-CM

## 2014-01-20 LAB — CBC
HEMATOCRIT: 37.4 % (ref 36.0–46.0)
HEMOGLOBIN: 12.4 g/dL (ref 12.0–15.0)
MCH: 25.9 pg — AB (ref 26.0–34.0)
MCHC: 33.2 g/dL (ref 30.0–36.0)
MCV: 78.1 fL (ref 78.0–100.0)
Platelets: 167 10*3/uL (ref 150–400)
RBC: 4.79 MIL/uL (ref 3.87–5.11)
RDW: 19.4 % — ABNORMAL HIGH (ref 11.5–15.5)
WBC: 12.3 10*3/uL — ABNORMAL HIGH (ref 4.0–10.5)

## 2014-01-20 LAB — BASIC METABOLIC PANEL
BUN: 21 mg/dL (ref 6–23)
CALCIUM: 8.9 mg/dL (ref 8.4–10.5)
CO2: 28 mEq/L (ref 19–32)
Chloride: 91 mEq/L — ABNORMAL LOW (ref 96–112)
Creatinine, Ser: 1.03 mg/dL (ref 0.50–1.10)
GFR calc Af Amer: 74 mL/min — ABNORMAL LOW (ref >90)
GFR, EST NON AFRICAN AMERICAN: 64 mL/min — AB (ref >90)
GLUCOSE: 359 mg/dL — AB (ref 70–99)
Potassium: 5.2 mEq/L (ref 3.7–5.3)
Sodium: 133 mEq/L — ABNORMAL LOW (ref 137–147)

## 2014-01-20 LAB — GLUCOSE, CAPILLARY
GLUCOSE-CAPILLARY: 396 mg/dL — AB (ref 70–99)
GLUCOSE-CAPILLARY: 354 mg/dL — AB (ref 70–99)
GLUCOSE-CAPILLARY: 403 mg/dL — AB (ref 70–99)
GLUCOSE-CAPILLARY: 429 mg/dL — AB (ref 70–99)
GLUCOSE-CAPILLARY: 384 mg/dL — AB (ref 70–99)
Glucose-Capillary: 334 mg/dL — ABNORMAL HIGH (ref 70–99)
Glucose-Capillary: 417 mg/dL — ABNORMAL HIGH (ref 70–99)
Glucose-Capillary: 513 mg/dL — ABNORMAL HIGH (ref 70–99)
Glucose-Capillary: 488 mg/dL — ABNORMAL HIGH (ref 70–99)
Glucose-Capillary: 468 mg/dL — ABNORMAL HIGH (ref 70–99)

## 2014-01-20 MED ORDER — INSULIN ASPART 100 UNIT/ML ~~LOC~~ SOLN
15.0000 [IU] | Freq: Once | SUBCUTANEOUS | Status: AC
  Administered 2014-01-20: 15 [IU] via SUBCUTANEOUS

## 2014-01-20 MED ORDER — SODIUM CHLORIDE 0.9 % IV SOLN
INTRAVENOUS | Status: DC
  Administered 2014-01-20: 3.4 [IU]/h via INTRAVENOUS
  Administered 2014-01-21: 33.3 [IU]/h via INTRAVENOUS
  Administered 2014-01-21: 26.4 [IU]/h via INTRAVENOUS
  Administered 2014-01-21: 21 [IU]/h via INTRAVENOUS
  Filled 2014-01-20 (×5): qty 1

## 2014-01-20 MED ORDER — LORAZEPAM 2 MG/ML IJ SOLN
1.0000 mg | Freq: Four times a day (QID) | INTRAMUSCULAR | Status: DC | PRN
  Administered 2014-01-20 – 2014-01-24 (×5): 1 mg via INTRAVENOUS
  Filled 2014-01-20 (×5): qty 1

## 2014-01-20 MED ORDER — INSULIN GLARGINE 100 UNIT/ML ~~LOC~~ SOLN
30.0000 [IU] | Freq: Two times a day (BID) | SUBCUTANEOUS | Status: DC
  Administered 2014-01-20: 30 [IU] via SUBCUTANEOUS
  Filled 2014-01-20 (×2): qty 0.3

## 2014-01-20 MED ORDER — INSULIN ASPART 100 UNIT/ML ~~LOC~~ SOLN: 0.0000 [IU] | Freq: Three times a day (TID) | SUBCUTANEOUS | Status: DC

## 2014-01-20 MED ORDER — DEXTROSE 50 % IV SOLN: 25.0000 mL | INTRAVENOUS | Status: DC | PRN

## 2014-01-20 MED ORDER — INSULIN REGULAR BOLUS VIA INFUSION
0.0000 [IU] | Freq: Three times a day (TID) | INTRAVENOUS | Status: DC
  Administered 2014-01-20: 6.6 [IU] via INTRAVENOUS
  Administered 2014-01-21: 7.7 [IU] via INTRAVENOUS
  Administered 2014-01-21 (×2): 5.1 [IU] via INTRAVENOUS
  Filled 2014-01-20: qty 10

## 2014-01-20 MED ORDER — INSULIN ASPART 100 UNIT/ML ~~LOC~~ SOLN: 0.0000 [IU] | Freq: Every day | SUBCUTANEOUS | Status: DC

## 2014-01-20 MED ORDER — HYDROCOD POLST-CHLORPHEN POLST 10-8 MG/5ML PO LQCR
5.0000 mL | Freq: Two times a day (BID) | ORAL | Status: DC | PRN
  Administered 2014-01-20 – 2014-01-24 (×5): 5 mL via ORAL
  Filled 2014-01-20 (×5): qty 5

## 2014-01-20 MED ORDER — GLIMEPIRIDE 4 MG PO TABS
4.0000 mg | ORAL_TABLET | Freq: Every day | ORAL | Status: DC
  Filled 2014-01-20: qty 1

## 2014-01-20 MED ORDER — IPRATROPIUM-ALBUTEROL 0.5-2.5 (3) MG/3ML IN SOLN
3.0000 mL | RESPIRATORY_TRACT | Status: DC
  Administered 2014-01-20 – 2014-01-21 (×6): 3 mL via RESPIRATORY_TRACT
  Filled 2014-01-20 (×6): qty 3

## 2014-01-20 MED ORDER — SODIUM CHLORIDE 0.9 % IV SOLN
INTRAVENOUS | Status: DC
  Administered 2014-01-20 – 2014-01-21 (×3): via INTRAVENOUS

## 2014-01-20 MED ORDER — INSULIN ASPART 100 UNIT/ML ~~LOC~~ SOLN
0.0000 [IU] | Freq: Three times a day (TID) | SUBCUTANEOUS | Status: DC
  Administered 2014-01-20: 20 [IU] via SUBCUTANEOUS

## 2014-01-20 MED ORDER — GI COCKTAIL ~~LOC~~: 30.0000 mL | Freq: Once | ORAL | Status: DC

## 2014-01-20 NOTE — Progress Notes (Signed)
PULMONARY / CRITICAL CARE MEDICINE   Name: [name] MRN: [id number] DOB: [birth year]    ADMISSION DATE:  01/18/2014 CONSULTATION DATE:  01/19/14  REFERRING MD :  Dr. Roel Cluck PRIMARY SERVICE: Hospitalist  CHIEF COMPLAINT:  dyspnea  BRIEF PATIENT DESCRIPTION: Morbidly obese patient with known COPD + OSA on home CPAP and oxygen, diastolic HF, possible pulm HTN, coming in with progressive dyspnea likely COPD exac due to viral illness. Active smoker. 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  SIGNIFICANT EVENTS / STUDIES:  4/27 >> BiPAP initiated  LINES / TUBES: PIV  CULTURES: Pending  ANTIBIOTICS: Vancomycin 4/26 Zosyn 4/26 Azithromycin 4/27 >>  SUBJECTIVE: Off BiPAP now but was on overnight, feels better.  VITAL SIGNS: Temp:  [96.8 F (36 C)-97.6 F (36.4 C)] 97.4 F (36.3 C) (04/28 0700) Pulse Rate:  [73-90] 73 (04/28 0746) Resp:  [16-30] 19 (04/28 0746) BP: (95-113)/(43-54) 95/45 mmHg (04/28 0746) SpO2:  [96 %-100 %] 99 % (04/28 0824) FiO2 (%):  [30 %-40 %] 30 % (04/28 0542) HEMODYNAMICS:   VENTILATOR SETTINGS: Vent Mode:  [-]  FiO2 (%):  [30 %-40 %] 30 % INTAKE / OUTPUT: Intake/Output     04/27 0701 - 04/28 0700 04/28 0701 - 04/29 0700   P.O. 720    I.V. (mL/kg) 23 (0.2) 3 (0)   IV Piggyback 300 50   Total Intake(mL/kg) 1043 (7.7) 53 (0.4)   Net +1043 +53        Urine Occurrence 4900 x 1100 x   Stool Occurrence       PHYSICAL EXAMINATION: General:  Awake, alert, oriented x 4, off BiPAP Neuro:  No focal weakness, no facial droop, speech normal, EOM full and equal HEENT:  Atraumatic, trachea midline, thick neck, no enlarged neck LN, BiPAP mask on face Cardiovascular:  RRR, no loud murmur Lungs:  Decreased breath sounds bilaterally, has generalized expiratory wheeze, no definite rales Abdomen:  Soft, nontender, no guarding Musculoskeletal:  No deformity, no calf tenderness, trace edema Skin:  No acute rash, no sacral  wound  LABS:  CBC  Recent Labs Lab 01/18/14 2034 01/19/14 0220 01/20/14 0500  WBC 8.1 8.4 12.3*  HGB 13.1 13.5 12.4  HCT 39.1 39.9 37.4  PLT 158 157 167   Coag's No results found for this basename: APTT, INR,  in the last 168 hours BMET  Recent Labs Lab 01/18/14 2034 01/19/14 0220 01/20/14 0500  NA 133* 133* 133*  K 3.5* 3.8 5.2  CL 93* 92* 91*  CO2 26 22 28   BUN 14 15 21   CREATININE 0.95 1.02 1.03  GLUCOSE 200* 306* 359*   Electrolytes  Recent Labs Lab 01/18/14 2034 01/19/14 0220 01/20/14 0500  CALCIUM 9.2 9.0 8.9   Sepsis Markers No results found for this basename: LATICACIDVEN, PROCALCITON, O2SATVEN,  in the last 168 hours ABG  Recent Labs Lab 01/18/14 2319  PHART 7.382  PCO2ART 48.1*  PO2ART 141.0*   Liver Enzymes  Recent Labs Lab 01/18/14 2034 01/19/14 0220  AST 27 28  ALT 39* 41*  ALKPHOS 122* 115  BILITOT 0.4 0.6  ALBUMIN 3.4* 3.6   Cardiac Enzymes  Recent Labs Lab 01/18/14 2034 01/19/14 0220  TROPONINI <0.30 <0.30  PROBNP 802.5*  --    Glucose  Recent Labs Lab 01/19/14 0742 01/19/14 1201 01/19/14 1829 01/19/14 2101 01/20/14 0745 01/20/14 1103  GLUCAP 329* 332* 269* 396* 334* 417*    Imaging Dg Chest 2 View  01/18/2014   ONE CLINICAL DATA: History of congestion  and nonproductive cough; history of COPD, diabetes, and congestive heart failure  EXAM: CHEST  2 VIEW  COMPARISON:  DG CHEST 2 VIEW dated 12/17/2013  FINDINGS: The lungs are well-expanded. The interstitial markings are increased bilaterally which is not entirely new. However, there are coarse lung markings in the retrocardiac region on the lateral film which are more conspicuous than in the past. There is no pleural effusion. The cardiopericardial silhouette is top-normal in size. The central pulmonary vascularity is prominent but stable. The mediastinum is normal in width. There is no pneumothorax or pneumomediastinum. The observed portions of the bony thorax  exhibit no acute abnormalities.  IMPRESSION: There are findings consistent with pulmonary fibrosis. However, the interstitial markings are increased over and above that seen on the previous study and confluent density in the retrocardiac region likely on the left is suspicious for pneumonia. There is no definite evidence of CHF. Followup films following therapy are recommended to assure clearing. Chest CT scanning may be ultimately needed.   Electronically Signed   By: David  [place]   On: 01/18/2014 21:12   Ct Angio Chest Pe W/cm &/or Wo Cm  01/19/2014   CLINICAL DATA:  Shortness of breath  EXAM: CT ANGIOGRAPHY CHEST WITH CONTRAST  TECHNIQUE: Multidetector CT imaging of the chest was performed using the standard protocol during bolus administration of intravenous contrast. Multiplanar CT image reconstructions and MIPs were obtained to evaluate the vascular anatomy.  CONTRAST:  OMNIPAQUE IOHEXOL 350 MG/ML SOLN  COMPARISON:  None.  FINDINGS: There is adequate opacification of the central pulmonary arteries with no central pulmonary embolus. The segmental and subsegmental branches are suboptimally opacified and are degraded by patient respiratory motion making these areas nondiagnostic. The main pulmonary artery, right main pulmonary artery and left main pulmonary arteries are normal in size. The heart size is normal. There is no pericardial effusion.  There are patchy areas of ground-glass opacity throughout bilateral lungs which may be secondary to an infectious or inflammatory etiology versus normal expiration with the areas lacking ground-glass opacity reflecting air trapping versus hypersensitivity pneumonitis. There is no pleural effusion or pneumothorax. There is bibasilar mild atelectasis  There is no mediastinal or axillary lymphadenopathy. There is a mildly enlarged right hilar lymph node measuring 12 mm in short axis which may be reactive given the pulmonary findings.  There is no lytic or blastic  osseous lesion.  The visualized portions of the upper abdomen are unremarkable.  Review of the MIP images confirms the above findings.  IMPRESSION: 1. Adequate opacification of the central pulmonary arteries with no central pulmonary embolus. The segmental and subsegmental branches are suboptimally opacified and are degraded by patient respiratory motion making these areas nondiagnostic.  2. Patchy areas of ground-glass opacity throughout bilateral lungs which may be secondary to an infectious or inflammatory etiology versus normal expiration with the areas lacking ground-glass opacity reflecting air trapping versus hypersensitivity pneumonitis.   Electronically Signed   By: Elige Ko   On: 01/19/2014 18:24   Ir US Guide Vasc Access Right  01/19/2014   CLINICAL DATA:  Poor peripheral IV access, need for additional central venous access and access to administer IV contrast for CT scan.  EXAM: POWER PICC LINE PLACEMENT WITH ULTRASOUND AND FLUOROSCOPIC GUIDANCE  FLUOROSCOPY TIME:  12 seconds.  PROCEDURE: The patient was advised of the possible risks and complications and agreed to undergo the procedure. The patient was then brought to the angiographic suite for the procedure.  The right arm was  prepped with chlorhexidine, draped in the usual sterile fashion using maximum barrier technique (cap and mask, sterile gown, sterile gloves, large sterile sheet, hand hygiene and cutaneous antisepsis) and infiltrated locally with 1% Lidocaine.  Ultrasound demonstrated patency of the right brachial vein, and this was documented with an image. Under real-time ultrasound guidance, this vein was accessed with a 21 gauge micropuncture needle and image documentation was performed. A 0.018 wire was introduced in to the vein. Over this, a 5.0 [place] dual lumen power injectable PICC cut to 41 cm was advanced to the lower SVC/right atrial junction. Fluoroscopy during the procedure and fluoro spot radiograph confirms appropriate  catheter position. The catheter was flushed and covered with a sterile dressing.  Complications: None  IMPRESSION: Successful right arm power injectable PICC line placement with ultrasound and fluoroscopic guidance. The catheter is ready for use.   Electronically Signed   By: Aletta Edouard M.D.   On: 01/19/2014 17:16   Dg Chest Port 1 View  01/19/2014   CLINICAL DATA:  Right PICC line placement  EXAM: PORTABLE CHEST - 1 VIEW  COMPARISON:  None.  FINDINGS: Right subclavian PICC line catheter tip is at the level of the distal right innominate vein. This could be advanced 12 cm to the SVC RA junction.  Mild cardiomegaly with slight interstitial edema diffusely. Bibasilar atelectasis versus scarring noted. No new focal pneumonia, collapse or consolidation. No large effusion or pneumothorax. Trachea is midline.  IMPRESSION: Right PICC line tip at the right innominate vein level, this could be advanced 12 cm to the SVC RA junction  Mild interstitial edema pattern, compatible with early CHF  Bibasilar atelectasis versus scarring   Electronically Signed   By: Daryll Brod M.D.   On: 01/19/2014 12:02   Ir Fluoro Guide Cv Midline Picc Right  01/19/2014   CLINICAL DATA:  Poor peripheral IV access, need for additional central venous access and access to administer IV contrast for CT scan.  EXAM: POWER PICC LINE PLACEMENT WITH ULTRASOUND AND FLUOROSCOPIC GUIDANCE  FLUOROSCOPY TIME:  12 seconds.  PROCEDURE: The patient was advised of the possible risks and complications and agreed to undergo the procedure. The patient was then brought to the angiographic suite for the procedure.  The right arm was prepped with chlorhexidine, draped in the usual sterile fashion using maximum barrier technique (cap and mask, sterile gown, sterile gloves, large sterile sheet, hand hygiene and cutaneous antisepsis) and infiltrated locally with 1% Lidocaine.  Ultrasound demonstrated patency of the right brachial vein, and this was documented  with an image. Under real-time ultrasound guidance, this vein was accessed with a 21 gauge micropuncture needle and image documentation was performed. A 0.018 wire was introduced in to the vein. Over this, a 5.0 [place] dual lumen power injectable PICC cut to 41 cm was advanced to the lower SVC/right atrial junction. Fluoroscopy during the procedure and fluoro spot radiograph confirms appropriate catheter position. The catheter was flushed and covered with a sterile dressing.  Complications: None  IMPRESSION: Successful right arm power injectable PICC line placement with ultrasound and fluoroscopic guidance. The catheter is ready for use.   Electronically Signed   By: Aletta Edouard M.D.   On: 01/19/2014 17:16    ASSESSMENT / PLAN:  PULMONARY A: Acute COPD exacerbation in an active smoker, likely triggered by viral illness, requiring BiPAP Less likely bacterial PNA at this point Known OSA/OHS on CPAP and oxygen at home Smoker Possible Pulm HTN likey Group 2 and 3 Old  history of sarcoid, likely inactive P:   - Change BiPAP to home CPAP. - Scheduled duoneb + pulmicort + Solumedrol and switch to prednisone once patient better and can take PO safely. - Changed to roc/zith, would continue that. If patient develops high fever or green/yellow sputum, will consider broadening abx coverage. - Pending sputum culture. - Respiratory virus panel negative. - Strongly advised to stop smoking, will place on nicotine patch and loose wt. - Blood sugar control. - Recommend diureses.  TODAY'S SUMMARY: 24 F morbidly obese, admitted for COPD exac.  Resp viral panel negative, OSA on CPAP at home.  D/C BiPAP and change to CPAP.  Recommend diureses some today.  Continue abx and current treatment for now.  I have personally obtained a history, examined the patient, evaluated laboratory and imaging results, formulated the assessment and plan and placed orders.  Rush Farmer, M.D. [facility] Pulmonary/Critical Care  Medicine. Pager: [phone]. After hours pager: [phone].

## 2014-01-20 NOTE — Progress Notes (Signed)
Pt transferred with all belongings and oxygen tank to 2W01. VSS. Report given and all questions answered.

## 2014-01-20 NOTE — Progress Notes (Addendum)
[facility] PROGRESS NOTE  Stuart Guillen BJS:[id number] DOB: 15-Jun-1966 DOA: 01/18/2014 PCP: Gwendolyn Grant, MD  Interim Summary Patient is a pleasant 48 year old female with a past medical history of chronic obstructive pulmonary disease, ongoing tobacco abuse, type 2 diabetes mellitus admitted to the medicine service on 01/18/2014. She presented with complaints of shortness of breath, cough, wheezing, found to be in acute hypoxemic respiratory failure. Initial chest x-ray revealed findings that could be suspicious for pneumonia. It was felt that she had a COPD exacerbation precipitated by underlying infectious process, bacterial or viral. Patient was started on empiric IV antibiotic therapy, IV steroids, scheduled nebulizers, admitted to the step down unit. She had a CT scan of lungs performed on 01/19/2014 which did not show evidence of pulmonary embolism. The study did reveal patchy areas of groundglass opacity likely secondary to an infectious or inflammatory process. By 01/20/2014 she reported doing much better, transfer orders written for telemetry. Due to to difficult IV access a PICC line was placed on 01/19/2014.                                                                                                                                                     Assessment/Plan: 1. Acute hypoxemic respiratory failure -Patient presented in respiratory distress, breathing 30 breaths per minute requiring BiPAP. -Likely secondary to COPD exacerbation precipitated from underlying infectious process. -CT scan of lungs do not show evidence of pulmonary embolism. Patchy areas of groundglass opacity noted which likely reflects infectious process. -Patient improved by 01/20/2014, transferring out of step down unit  2. Chronic obstructive pulmonary disease exacerbation -Patient with history of tobacco abuse, presented in respiratory failure. Probable underlying infectious process  precipitating COPD exacerbation. -Continue IV steroids with Solu-Medrol 60 mg IV every 6 hours, scheduled nebs, empiric IV antibiotic therapy.  3. Community acquire pneumonia versus viral pneumonia -Influenza negative -Continue empiric IV antibiotic therapy with IV ceftriaxone and azithromycin -Blood cultures remain negative  4. Insulin-dependent diabetes mellitus -Patient's blood sugars elevated I suspect caused by IV steroids -Patient's blood sugars remain elevated, with blood sugars in the 300 (334 - 396) range this morning. Will further increase her Lantus to 30 units twice a day. Amaryl increased to 4 mg PO q daily -Accu-Cheks q. a.c. and each bedtime with sliding scale coverage  5. Obstructive sleep apnea -NPPV each bedtime  6. IV access -Patient difficult IV access, PICC line ordered  7. Chronic diastolic congestive heart failure -Appears compensated clinically -Continue torsemide 100 mg by mouth twice a day and spironolactone 25 mg by mouth daily    Code Status: Full code Family Communication: Disposition Plan: Transfer to telemetry   Consultants:  Pulmonary critical care medicine  Procedures:  PICC line placement on 01/19/2014  Antibiotics:  Ceftriaxone 1 g IV every 24 hours  Azithromycin 500 mg IV every  24 hours  HPI/Subjective: Patient states feeling a little better this morning, appears to be breathing more comfortably. She is sitting up having a breakfast.  Objective: Filed Vitals:   01/20/14 0746  BP: 95/45  Pulse: 73  Temp:   Resp: 19    Intake/Output Summary (Last 24 hours) at 01/20/14 0901 Last data filed at 01/20/14 0800  Gross per 24 hour  Intake   1093 ml  Output      0 ml  Net   1093 ml   Filed Weights   01/18/14 2347  Weight: 134.8 kg (297 lb 2.9 oz)    Exam:   General:  Patient appears better, awake alert oriented. Sitting up having her breakfast  Cardiovascular: Tachycardic, regular rate rhythm normal  S1-S2  Respiratory: Diminished breath sounds bilaterally, bilateral expiratory wheezes, positive rhonchi, on supplemental oxygen  Abdomen: Soft nontender nondistended  Extremities: No edema  Musculoskeletal: Present range of motion to all extremities no joint pain  Data Reviewed: Basic Metabolic Panel:  Recent Labs Lab 01/18/14 2034 01/19/14 0220 01/20/14 0500  NA 133* 133* 133*  K 3.5* 3.8 5.2  CL 93* 92* 91*  CO2 26 22 28   GLUCOSE 200* 306* 359*  BUN 14 15 21   CREATININE 0.95 1.02 1.03  CALCIUM 9.2 9.0 8.9   Liver Function Tests:  Recent Labs Lab 01/18/14 2034 01/19/14 0220  AST 27 28  ALT 39* 41*  ALKPHOS 122* 115  BILITOT 0.4 0.6  PROT 7.4 7.6  ALBUMIN 3.4* 3.6   No results found for this basename: LIPASE, AMYLASE,  in the last 168 hours No results found for this basename: AMMONIA,  in the last 168 hours CBC:  Recent Labs Lab 01/18/14 2034 01/19/14 0220 01/20/14 0500  WBC 8.1 8.4 12.3*  NEUTROABS 6.0 7.5  --   HGB 13.1 13.5 12.4  HCT 39.1 39.9 37.4  MCV 77.0* 77.8* 78.1  PLT 158 157 167   Cardiac Enzymes:  Recent Labs Lab 01/18/14 2034 01/19/14 0220  TROPONINI <0.30 <0.30   BNP (last 3 results)  Recent Labs  11/16/13 2349 12/09/13 1811 01/18/14 2034  PROBNP 53.5 24.6 802.5*   CBG:  Recent Labs Lab 01/19/14 0742 01/19/14 1201 01/19/14 1829 01/19/14 2101 01/20/14 0745  GLUCAP 329* 332* 269* 396* 334*    Recent Results (from the past 240 hour(s))  URINE CULTURE     Status: None   Collection Time    01/19/14 12:10 AM      Result Value Ref Range Status   Specimen Description URINE, CLEAN CATCH   Final   Special Requests ADDED [id number]   Final   Culture  Setup Time     Final   Value: 01/19/2014 01:08     Performed at [facility]     Final   Value: 7,000 COLONIES/ML     Performed at [facility]   Culture     Final   Value: INSIGNIFICANT GROWTH     Performed at [facility]   Report  Status 01/19/2014 FINAL   Final  CULTURE, BLOOD (ROUTINE X 2)     Status: None   Collection Time    01/19/14  1:15 AM      Result Value Ref Range Status   Specimen Description BLOOD LEFT ARM   Final   Special Requests BOTTLES DRAWN AEROBIC ONLY 3CC   Final   Culture  Setup Time     Final   Value:  01/19/2014 09:43     Performed at [facility]   Culture     Final   Value:        BLOOD CULTURE RECEIVED NO GROWTH TO DATE CULTURE WILL BE HELD FOR 5 DAYS BEFORE ISSUING A FINAL NEGATIVE REPORT     Performed at [facility]   Report Status PENDING   Incomplete  RESPIRATORY VIRUS PANEL     Status: None   Collection Time    01/19/14  2:13 AM      Result Value Ref Range Status   Source - RVPAN NASAL SWAB   Corrected   Comment: CORRECTED ON 04/27 AT 2141: PREVIOUSLY REPORTED AS NASAL SWAB   Respiratory Syncytial Virus A NOT DETECTED   Final   Respiratory Syncytial Virus B NOT DETECTED   Final   Influenza A NOT DETECTED   Final   Influenza B NOT DETECTED   Final   Parainfluenza 1 NOT DETECTED   Final   Parainfluenza 2 NOT DETECTED   Final   Parainfluenza 3 NOT DETECTED   Final   Metapneumovirus NOT DETECTED   Final   Rhinovirus NOT DETECTED   Final   Adenovirus NOT DETECTED   Final   Influenza A H1 NOT DETECTED   Final   Influenza A H3 NOT DETECTED   Final   Comment: (NOTE)           Normal Reference Range for each Analyte: NOT DETECTED     Testing performed using the Luminex xTAG Respiratory Viral Panel test     kit.     This test was developed and its performance characteristics determined     by [facility]. It has not been cleared or approved by the [place]     Food and Drug Administration. This test is used for clinical purposes.     It should not be regarded as investigational or for research. This     laboratory is certified under the [place] (CLIA) as qualified to perform high complexity     clinical laboratory  testing.     Performed at [facility] SCREENING     Status: None   Collection Time    01/19/14  2:14 AM      Result Value Ref Range Status   MRSA by PCR NEGATIVE  NEGATIVE Final   Comment:            The GeneXpert MRSA Assay (FDA     approved for NASAL specimens     only), is one component of a     comprehensive MRSA colonization     surveillance program. It is not     intended to diagnose MRSA     infection nor to guide or     monitor treatment for     MRSA infections.  CULTURE, BLOOD (ROUTINE X 2)     Status: None   Collection Time    01/19/14  2:20 AM      Result Value Ref Range Status   Specimen Description BLOOD LEFT WRIST   Final   Special Requests BOTTLES DRAWN AEROBIC ONLY 4CC   Final   Culture  Setup Time     Final   Value: 01/19/2014 09:48     Performed at [facility]   Culture     Final   Value:        BLOOD CULTURE RECEIVED  NO GROWTH TO DATE CULTURE WILL BE HELD FOR 5 DAYS BEFORE ISSUING A FINAL NEGATIVE REPORT     Performed at [facility]   Report Status PENDING   Incomplete     Studies: Dg Chest 2 View  01/18/2014   ONE CLINICAL DATA: History of congestion and nonproductive cough; history of COPD, diabetes, and congestive heart failure  EXAM: CHEST  2 VIEW  COMPARISON:  DG CHEST 2 VIEW dated 12/17/2013  FINDINGS: The lungs are well-expanded. The interstitial markings are increased bilaterally which is not entirely new. However, there are coarse lung markings in the retrocardiac region on the lateral film which are more conspicuous than in the past. There is no pleural effusion. The cardiopericardial silhouette is top-normal in size. The central pulmonary vascularity is prominent but stable. The mediastinum is normal in width. There is no pneumothorax or pneumomediastinum. The observed portions of the bony thorax exhibit no acute abnormalities.  IMPRESSION: There are findings consistent with pulmonary fibrosis. However, the  interstitial markings are increased over and above that seen on the previous study and confluent density in the retrocardiac region likely on the left is suspicious for pneumonia. There is no definite evidence of CHF. Followup films following therapy are recommended to assure clearing. Chest CT scanning may be ultimately needed.   Electronically Signed   By: David  Martinique   On: 01/18/2014 21:12   Ct Angio Chest Pe W/cm &/or Wo Cm  01/19/2014   CLINICAL DATA:  Shortness of breath  EXAM: CT ANGIOGRAPHY CHEST WITH CONTRAST  TECHNIQUE: Multidetector CT imaging of the chest was performed using the standard protocol during bolus administration of intravenous contrast. Multiplanar CT image reconstructions and MIPs were obtained to evaluate the vascular anatomy.  CONTRAST:  122m OMNIPAQUE IOHEXOL 350 MG/ML SOLN  COMPARISON:  None.  FINDINGS: There is adequate opacification of the central pulmonary arteries with no central pulmonary embolus. The segmental and subsegmental branches are suboptimally opacified and are degraded by patient respiratory motion making these areas nondiagnostic. The main pulmonary artery, right main pulmonary artery and left main pulmonary arteries are normal in size. The heart size is normal. There is no pericardial effusion.  There are patchy areas of ground-glass opacity throughout bilateral lungs which may be secondary to an infectious or inflammatory etiology versus normal expiration with the areas lacking ground-glass opacity reflecting air trapping versus hypersensitivity pneumonitis. There is no pleural effusion or pneumothorax. There is bibasilar mild atelectasis  There is no mediastinal or axillary lymphadenopathy. There is a mildly enlarged right hilar lymph node measuring 12 mm in short axis which may be reactive given the pulmonary findings.  There is no lytic or blastic osseous lesion.  The visualized portions of the upper abdomen are unremarkable.  Review of the MIP images confirms  the above findings.  IMPRESSION: 1. Adequate opacification of the central pulmonary arteries with no central pulmonary embolus. The segmental and subsegmental branches are suboptimally opacified and are degraded by patient respiratory motion making these areas nondiagnostic.  2. Patchy areas of ground-glass opacity throughout bilateral lungs which may be secondary to an infectious or inflammatory etiology versus normal expiration with the areas lacking ground-glass opacity reflecting air trapping versus hypersensitivity pneumonitis.   Electronically Signed   By: HKathreen Devoid  On: 01/19/2014 18:24   Ir [place] Vasc Access Right  01/19/2014   CLINICAL DATA:  Poor peripheral IV access, need for additional central venous access and access to administer IV contrast for CT  scan.  EXAM: POWER PICC LINE PLACEMENT WITH ULTRASOUND AND FLUOROSCOPIC GUIDANCE  FLUOROSCOPY TIME:  12 seconds.  PROCEDURE: The patient was advised of the possible risks and complications and agreed to undergo the procedure. The patient was then brought to the angiographic suite for the procedure.  The right arm was prepped with chlorhexidine, draped in the usual sterile fashion using maximum barrier technique (cap and mask, sterile gown, sterile gloves, large sterile sheet, hand hygiene and cutaneous antisepsis) and infiltrated locally with 1% Lidocaine.  Ultrasound demonstrated patency of the right brachial vein, and this was documented with an image. Under real-time ultrasound guidance, this vein was accessed with a 21 gauge micropuncture needle and image documentation was performed. A 0.018 wire was introduced in to the vein. Over this, a 5.0 [place] dual lumen power injectable PICC cut to 41 cm was advanced to the lower SVC/right atrial junction. Fluoroscopy during the procedure and fluoro spot radiograph confirms appropriate catheter position. The catheter was flushed and covered with a sterile dressing.  Complications: None  IMPRESSION:  Successful right arm power injectable PICC line placement with ultrasound and fluoroscopic guidance. The catheter is ready for use.   Electronically Signed   By: Aletta Edouard M.D.   On: 01/19/2014 17:16   Dg Chest Port 1 View  01/19/2014   CLINICAL DATA:  Right PICC line placement  EXAM: PORTABLE CHEST - 1 VIEW  COMPARISON:  None.  FINDINGS: Right subclavian PICC line catheter tip is at the level of the distal right innominate vein. This could be advanced 12 cm to the SVC RA junction.  Mild cardiomegaly with slight interstitial edema diffusely. Bibasilar atelectasis versus scarring noted. No new focal pneumonia, collapse or consolidation. No large effusion or pneumothorax. Trachea is midline.  IMPRESSION: Right PICC line tip at the right innominate vein level, this could be advanced 12 cm to the SVC RA junction  Mild interstitial edema pattern, compatible with early CHF  Bibasilar atelectasis versus scarring   Electronically Signed   By: Daryll Brod M.D.   On: 01/19/2014 12:02   Ir Fluoro Guide Cv Midline Picc Right  01/19/2014   CLINICAL DATA:  Poor peripheral IV access, need for additional central venous access and access to administer IV contrast for CT scan.  EXAM: POWER PICC LINE PLACEMENT WITH ULTRASOUND AND FLUOROSCOPIC GUIDANCE  FLUOROSCOPY TIME:  12 seconds.  PROCEDURE: The patient was advised of the possible risks and complications and agreed to undergo the procedure. The patient was then brought to the angiographic suite for the procedure.  The right arm was prepped with chlorhexidine, draped in the usual sterile fashion using maximum barrier technique (cap and mask, sterile gown, sterile gloves, large sterile sheet, hand hygiene and cutaneous antisepsis) and infiltrated locally with 1% Lidocaine.  Ultrasound demonstrated patency of the right brachial vein, and this was documented with an image. Under real-time ultrasound guidance, this vein was accessed with a 21 gauge micropuncture needle and  image documentation was performed. A 0.018 wire was introduced in to the vein. Over this, a 5.0 [place] dual lumen power injectable PICC cut to 41 cm was advanced to the lower SVC/right atrial junction. Fluoroscopy during the procedure and fluoro spot radiograph confirms appropriate catheter position. The catheter was flushed and covered with a sterile dressing.  Complications: None  IMPRESSION: Successful right arm power injectable PICC line placement with ultrasound and fluoroscopic guidance. The catheter is ready for use.   Electronically Signed   By: Jenness Corner.D.  On: 01/19/2014 17:16    Scheduled Meds: . ALPRAZolam  1 mg Oral TID  . aspirin EC  81 mg Oral Daily  . azithromycin  500 mg Intravenous Q24H  . budesonide (PULMICORT) nebulizer solution  0.5 mg Nebulization BID  . cefTRIAXone (ROCEPHIN)  IV  1 g Intravenous Q24H  . enoxaparin (LOVENOX) injection  60 mg Subcutaneous Q24H  . glimepiride  2 mg Oral QAC breakfast  . insulin aspart  0-20 Units Subcutaneous TID WC  . insulin aspart  0-5 Units Subcutaneous QHS  . insulin glargine  30 Units Subcutaneous BID  . ipratropium-albuterol  3 mL Nebulization Q6H  . ipratropium-albuterol  3 mL Nebulization Q4H  . loratadine  10 mg Oral Daily  . methylPREDNISolone (SOLU-MEDROL) injection  60 mg Intravenous Q6H  . nicotine  14 mg Transdermal Daily  . pantoprazole  40 mg Oral Daily  . potassium chloride SA  40 mEq Oral BID  . sertraline  100 mg Oral Daily  . sodium chloride  10-40 mL Intracatheter Q12H  . sodium chloride  3 mL Intravenous Q12H  . spironolactone  25 mg Oral BID  . torsemide  100 mg Oral BID   Continuous Infusions:   Active Problems:   HYPERTENSION   OBSTRUCTIVE SLEEP APNEA   Diabetes type 2, uncontrolled   Tobacco abuse   Acute-on-chronic respiratory failure   Chronic diastolic CHF (congestive heart failure)   Asthma exacerbation in COPD   Hypokalemia   COPD exacerbation   HCAP (healthcare-associated  pneumonia)    Time spent: 35 minutes    [facility] Pager [phone] 7PM-7AM, please contact night-coverage at [email or web address], password [facility] 01/20/2014, 9:01 AM  LOS: 2 days

## 2014-01-20 NOTE — Progress Notes (Signed)
Patient's blood sugars uncontrolled today, becoming as high as 513. I had initially increase her Lantus dose, however blood sugars continue to increase. She was started on IV insulin. I suspect elevated blood sugars because of by IV steroids. Have discontinued sliding scale coverage, Amaryl and Lantus. Continue monitoring but sugars every hour per protocol.

## 2014-01-20 NOTE — Progress Notes (Signed)
Pt. Transferred from 3S to 2W, has new order for nocturnal cpap as per home, kept tubing, replaced med/ffm-soiled, s/u for h/s, floor RT made aware.

## 2014-01-21 DIAGNOSIS — I5032 Chronic diastolic (congestive) heart failure: Secondary | ICD-10-CM

## 2014-01-21 DIAGNOSIS — I509 Heart failure, unspecified: Secondary | ICD-10-CM

## 2014-01-21 DIAGNOSIS — J441 Chronic obstructive pulmonary disease with (acute) exacerbation: Secondary | ICD-10-CM

## 2014-01-21 LAB — CBC
HEMATOCRIT: 37.5 % (ref 36.0–46.0)
HEMOGLOBIN: 12.2 g/dL (ref 12.0–15.0)
MCH: 25.6 pg — AB (ref 26.0–34.0)
MCHC: 32.5 g/dL (ref 30.0–36.0)
MCV: 78.8 fL (ref 78.0–100.0)
Platelets: 170 10*3/uL (ref 150–400)
RBC: 4.76 MIL/uL (ref 3.87–5.11)
RDW: 19.7 % — ABNORMAL HIGH (ref 11.5–15.5)
WBC: 12.3 10*3/uL — AB (ref 4.0–10.5)

## 2014-01-21 LAB — BASIC METABOLIC PANEL
BUN: 27 mg/dL — ABNORMAL HIGH (ref 6–23)
CHLORIDE: 94 meq/L — AB (ref 96–112)
CO2: 30 meq/L (ref 19–32)
Calcium: 8.9 mg/dL (ref 8.4–10.5)
Creatinine, Ser: 1.04 mg/dL (ref 0.50–1.10)
GFR calc Af Amer: 73 mL/min — ABNORMAL LOW (ref >90)
GFR calc non Af Amer: 63 mL/min — ABNORMAL LOW (ref >90)
GLUCOSE: 278 mg/dL — AB (ref 70–99)
Potassium: 4.5 mEq/L (ref 3.7–5.3)
SODIUM: 138 meq/L (ref 137–147)

## 2014-01-21 LAB — GLUCOSE, CAPILLARY
GLUCOSE-CAPILLARY: 200 mg/dL — AB (ref 70–99)
GLUCOSE-CAPILLARY: 207 mg/dL — AB (ref 70–99)
GLUCOSE-CAPILLARY: 236 mg/dL — AB (ref 70–99)
GLUCOSE-CAPILLARY: 243 mg/dL — AB (ref 70–99)
GLUCOSE-CAPILLARY: 245 mg/dL — AB (ref 70–99)
GLUCOSE-CAPILLARY: 278 mg/dL — AB (ref 70–99)
GLUCOSE-CAPILLARY: 285 mg/dL — AB (ref 70–99)
GLUCOSE-CAPILLARY: 290 mg/dL — AB (ref 70–99)
GLUCOSE-CAPILLARY: 306 mg/dL — AB (ref 70–99)
GLUCOSE-CAPILLARY: 329 mg/dL — AB (ref 70–99)
GLUCOSE-CAPILLARY: 336 mg/dL — AB (ref 70–99)
GLUCOSE-CAPILLARY: 360 mg/dL — AB (ref 70–99)
Glucose-Capillary: 174 mg/dL — ABNORMAL HIGH (ref 70–99)
Glucose-Capillary: 207 mg/dL — ABNORMAL HIGH (ref 70–99)
Glucose-Capillary: 215 mg/dL — ABNORMAL HIGH (ref 70–99)
Glucose-Capillary: 242 mg/dL — ABNORMAL HIGH (ref 70–99)
Glucose-Capillary: 271 mg/dL — ABNORMAL HIGH (ref 70–99)
Glucose-Capillary: 300 mg/dL — ABNORMAL HIGH (ref 70–99)
Glucose-Capillary: 344 mg/dL — ABNORMAL HIGH (ref 70–99)
Glucose-Capillary: 584 mg/dL (ref 70–99)

## 2014-01-21 MED ORDER — METHYLPREDNISOLONE SODIUM SUCC 125 MG IJ SOLR
60.0000 mg | Freq: Two times a day (BID) | INTRAMUSCULAR | Status: DC
  Administered 2014-01-21 – 2014-01-23 (×4): 60 mg via INTRAVENOUS
  Filled 2014-01-21 (×6): qty 0.96

## 2014-01-21 MED ORDER — INSULIN ASPART 100 UNIT/ML ~~LOC~~ SOLN: 10.0000 [IU] | Freq: Three times a day (TID) | SUBCUTANEOUS | Status: DC

## 2014-01-21 MED ORDER — INSULIN ASPART 100 UNIT/ML ~~LOC~~ SOLN
0.0000 [IU] | SUBCUTANEOUS | Status: DC
  Administered 2014-01-21: 7 [IU] via SUBCUTANEOUS
  Administered 2014-01-21: 15 [IU] via SUBCUTANEOUS

## 2014-01-21 MED ORDER — INSULIN GLARGINE 100 UNIT/ML ~~LOC~~ SOLN
30.0000 [IU] | Freq: Two times a day (BID) | SUBCUTANEOUS | Status: DC
  Administered 2014-01-21: 30 [IU] via SUBCUTANEOUS
  Filled 2014-01-21 (×2): qty 0.3

## 2014-01-21 MED ORDER — IPRATROPIUM-ALBUTEROL 0.5-2.5 (3) MG/3ML IN SOLN
3.0000 mL | Freq: Four times a day (QID) | RESPIRATORY_TRACT | Status: DC
  Administered 2014-01-21 – 2014-01-22 (×3): 3 mL via RESPIRATORY_TRACT
  Filled 2014-01-21 (×3): qty 3

## 2014-01-21 MED ORDER — LIRAGLUTIDE 18 MG/3ML ~~LOC~~ SOPN: 18.0000 mg | PEN_INJECTOR | Freq: Every day | SUBCUTANEOUS | Status: DC

## 2014-01-21 MED ORDER — GUAIFENESIN-DM 100-10 MG/5ML PO SYRP
5.0000 mL | ORAL_SOLUTION | ORAL | Status: DC | PRN
  Administered 2014-01-21 – 2014-01-24 (×2): 5 mL via ORAL
  Filled 2014-01-21 (×2): qty 5

## 2014-01-21 MED ORDER — GLIMEPIRIDE 1 MG PO TABS
1.0000 mg | ORAL_TABLET | Freq: Every day | ORAL | Status: DC
  Administered 2014-01-22 – 2014-01-25 (×4): 1 mg via ORAL
  Filled 2014-01-21 (×5): qty 1

## 2014-01-21 MED ORDER — AZITHROMYCIN 500 MG PO TABS
500.0000 mg | ORAL_TABLET | ORAL | Status: DC
  Administered 2014-01-21 – 2014-01-22 (×2): 500 mg via ORAL
  Filled 2014-01-21 (×4): qty 1

## 2014-01-21 NOTE — Progress Notes (Signed)
PULMONARY / CRITICAL CARE MEDICINE   Name: [name] MRN: [id number] DOB: 08-22-1966    ADMISSION DATE:  01/18/2014 CONSULTATION DATE:  01/19/14  REFERRING MD :  Dr. Roel Cluck PRIMARY SERVICE: Hospitalist  CHIEF COMPLAINT:  dyspnea  BRIEF PATIENT DESCRIPTION: Morbidly obese patient with known COPD + OSA on home CPAP and oxygen, diastolic HF, possible pulm HTN, coming in with progressive dyspnea likely COPD exac due to viral illness. Active smoker. 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  SIGNIFICANT EVENTS / STUDIES:  4/27 - BiPAP initiated Feb 11, 2023 - to floor  LINES / TUBES: PIV  CULTURES: BCx2 4/27>>> RVP 4/27>>>neg UC 4/27>>>neg   ANTIBIOTICS: Vancomycin 4/26 Zosyn 4/26 Azithromycin 4/27>>> Rocephin 4/27>>>  SUBJECTIVE: Reports feeling anxious, worried that she is not on home meds.  VITAL SIGNS: Temp:  [97.5 F (36.4 C)-98.2 F (36.8 C)] 97.5 F (36.4 C) (04/29 0333) Pulse Rate:  [73-97] 90 (04/29 0804) Resp:  [16-18] 18 (04/29 0724) BP: (110-144)/(54-75) 110/60 mmHg (04/29 0804) SpO2:  [95 %-99 %] 98 % (04/29 0804) Weight:  [305 lb 1.6 oz (138.392 kg)] 305 lb 1.6 oz (138.392 kg) (04/29 0333)  INTAKE / OUTPUT: Intake/Output     Feb 11, 2023 0701 - 04/29 0700 04/29 0701 - 04/30 0700   P.O. 480 240   I.V. (mL/kg) 3 (0)    IV Piggyback 50    Total Intake(mL/kg) 533 (3.9) 240 (1.7)   Urine (mL/kg/hr) 1600 (0.5)    Total Output 1600     Net -1067 +240        Urine Occurrence 1504 x 1 x     PHYSICAL EXAMINATION: General:  Awake, alert, oriented x 4 Neuro:  No focal weakness, no facial droop, speech normal, EOM full and equal HEENT:  Atraumatic, trachea midline, thick neck, no enlarged neck LN Cardiovascular:  RRR Lungs:  Decreased breath sounds bilaterally, has generalized expiratory wheeze, no definite rales Abdomen:  Soft, nontender, no guarding Musculoskeletal:  No deformity, no calf tenderness, trace edema Skin:  No acute rash, no sacral  wound  LABS:  CBC  Recent Labs Lab 01/19/14 0220 02-10-2014 0500 01/21/14 0500  WBC 8.4 12.3* 12.3*  HGB 13.5 12.4 12.2  HCT 39.9 37.4 37.5  PLT 157 167 170   BMET  Recent Labs Lab 01/19/14 0220 2014-02-10 0500 01/21/14 0500  NA 133* 133* 138  K 3.8 5.2 4.5  CL 92* 91* 94*  CO2 22 28 30   BUN 15 21 27*  CREATININE 1.02 1.03 1.04  GLUCOSE 306* 359* 278*   Liver Enzymes  Recent Labs Lab 01/18/14 2034 01/19/14 0220  AST 27 28  ALT 39* 41*  ALKPHOS 122* 115  BILITOT 0.4 0.6  ALBUMIN 3.4* 3.6   Glucose  Recent Labs Lab 01/21/14 0426 01/21/14 0523 01/21/14 0612 01/21/14 0735 01/21/14 0816 01/21/14 0957  GLUCAP 300* 271* 207* 174* 242* 285*    Imaging Ct Angio Chest Pe W/cm &/or Wo Cm  01/19/2014   CLINICAL DATA:  Shortness of breath  EXAM: CT ANGIOGRAPHY CHEST WITH CONTRAST  TECHNIQUE: Multidetector CT imaging of the chest was performed using the standard protocol during bolus administration of intravenous contrast. Multiplanar CT image reconstructions and MIPs were obtained to evaluate the vascular anatomy.  CONTRAST:  132mL OMNIPAQUE IOHEXOL 350 MG/ML SOLN  COMPARISON:  None.  FINDINGS: There is adequate opacification of the central pulmonary arteries with no central pulmonary embolus. The segmental and subsegmental branches are suboptimally opacified and are degraded by patient respiratory motion making these  areas nondiagnostic. The main pulmonary artery, right main pulmonary artery and left main pulmonary arteries are normal in size. The heart size is normal. There is no pericardial effusion.  There are patchy areas of ground-glass opacity throughout bilateral lungs which may be secondary to an infectious or inflammatory etiology versus normal expiration with the areas lacking ground-glass opacity reflecting air trapping versus hypersensitivity pneumonitis. There is no pleural effusion or pneumothorax. There is bibasilar mild atelectasis  There is no mediastinal or  axillary lymphadenopathy. There is a mildly enlarged right hilar lymph node measuring 12 mm in short axis which may be reactive given the pulmonary findings.  There is no lytic or blastic osseous lesion.  The visualized portions of the upper abdomen are unremarkable.  Review of the MIP images confirms the above findings.  IMPRESSION: 1. Adequate opacification of the central pulmonary arteries with no central pulmonary embolus. The segmental and subsegmental branches are suboptimally opacified and are degraded by patient respiratory motion making these areas nondiagnostic.  2. Patchy areas of ground-glass opacity throughout bilateral lungs which may be secondary to an infectious or inflammatory etiology versus normal expiration with the areas lacking ground-glass opacity reflecting air trapping versus hypersensitivity pneumonitis.   Electronically Signed   By: Kathreen Devoid   On: 01/19/2014 18:24   Ir US Guide Vasc Access Right  01/19/2014   CLINICAL DATA:  Poor peripheral IV access, need for additional central venous access and access to administer IV contrast for CT scan.  EXAM: POWER PICC LINE PLACEMENT WITH ULTRASOUND AND FLUOROSCOPIC GUIDANCE  FLUOROSCOPY TIME:  12 seconds.  PROCEDURE: The patient was advised of the possible risks and complications and agreed to undergo the procedure. The patient was then brought to the angiographic suite for the procedure.  The right arm was prepped with chlorhexidine, draped in the usual sterile fashion using maximum barrier technique (cap and mask, sterile gown, sterile gloves, large sterile sheet, hand hygiene and cutaneous antisepsis) and infiltrated locally with 1% Lidocaine.  Ultrasound demonstrated patency of the right brachial vein, and this was documented with an image. Under real-time ultrasound guidance, this vein was accessed with a 21 gauge micropuncture needle and image documentation was performed. A 0.018 wire was introduced in to the vein. Over this, a 5.0  [place] dual lumen power injectable PICC cut to 41 cm was advanced to the lower SVC/right atrial junction. Fluoroscopy during the procedure and fluoro spot radiograph confirms appropriate catheter position. The catheter was flushed and covered with a sterile dressing.  Complications: None  IMPRESSION: Successful right arm power injectable PICC line placement with ultrasound and fluoroscopic guidance. The catheter is ready for use.   Electronically Signed   By: Aletta Edouard M.D.   On: 01/19/2014 17:16   Dg Chest Port 1 View  01/19/2014   CLINICAL DATA:  Right PICC line placement  EXAM: PORTABLE CHEST - 1 VIEW  COMPARISON:  None.  FINDINGS: Right subclavian PICC line catheter tip is at the level of the distal right innominate vein. This could be advanced 12 cm to the SVC RA junction.  Mild cardiomegaly with slight interstitial edema diffusely. Bibasilar atelectasis versus scarring noted. No new focal pneumonia, collapse or consolidation. No large effusion or pneumothorax. Trachea is midline.  IMPRESSION: Right PICC line tip at the right innominate vein level, this could be advanced 12 cm to the SVC RA junction  Mild interstitial edema pattern, compatible with early CHF  Bibasilar atelectasis versus scarring   Electronically Signed  By: Daryll Brod M.D.   On: 01/19/2014 12:02   Ir Fluoro Guide Cv Midline Picc Right  01/19/2014   CLINICAL DATA:  Poor peripheral IV access, need for additional central venous access and access to administer IV contrast for CT scan.  EXAM: POWER PICC LINE PLACEMENT WITH ULTRASOUND AND FLUOROSCOPIC GUIDANCE  FLUOROSCOPY TIME:  12 seconds.  PROCEDURE: The patient was advised of the possible risks and complications and agreed to undergo the procedure. The patient was then brought to the angiographic suite for the procedure.  The right arm was prepped with chlorhexidine, draped in the usual sterile fashion using maximum barrier technique (cap and mask, sterile gown, sterile gloves,  large sterile sheet, hand hygiene and cutaneous antisepsis) and infiltrated locally with 1% Lidocaine.  Ultrasound demonstrated patency of the right brachial vein, and this was documented with an image. Under real-time ultrasound guidance, this vein was accessed with a 21 gauge micropuncture needle and image documentation was performed. A 0.018 wire was introduced in to the vein. Over this, a 5.0 [place] dual lumen power injectable PICC cut to 41 cm was advanced to the lower SVC/right atrial junction. Fluoroscopy during the procedure and fluoro spot radiograph confirms appropriate catheter position. The catheter was flushed and covered with a sterile dressing.  Complications: None  IMPRESSION: Successful right arm power injectable PICC line placement with ultrasound and fluoroscopic guidance. The catheter is ready for use.   Electronically Signed   By: Aletta Edouard M.D.   On: 01/19/2014 17:16    ASSESSMENT / PLAN:  PULMONARY A:  Acute COPD exacerbation - in an active smoker, likely triggered by viral illness, requiring BiPAP.  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 - Scheduled duoneb + pulmicort + Solumedrol and switch to prednisone in am 4/30. - Continue rocephin/azith. If patient develops high fever or green/yellow sputum, will consider broadening abx coverage. - Pending sputum culture. - Respiratory virus panel negative. - Strongly advised to stop smoking, will place on nicotine patch and loose wt. - Blood sugar control. - Recommend diureses. - Mobilize as able - Pulmonary hygiene  Noe Gens, NP-C [facility] Pgr: [phone] or [phone]  Patient seen and examined, agree with above note.  I dictated the care and orders written for this patient under my direction.  Rush Farmer, MD [phone]

## 2014-01-21 NOTE — Progress Notes (Signed)
Chart reviewed.  [facility] PROGRESS NOTE  [name] CBJ:[id number] DOB: Feb 13, 1966 DOA: 01/18/2014 PCP: Gwendolyn Grant, MD  Interim Summary Patient is a pleasant 48 year old female with a past medical history of chronic obstructive pulmonary disease, ongoing tobacco abuse, type 2 diabetes mellitus admitted to the medicine service on 01/18/2014. She presented with complaints of shortness of breath, cough, wheezing, found to be in acute hypoxemic respiratory failure. Initial chest x-ray revealed findings that could be suspicious for pneumonia. It was felt that she had a COPD exacerbation precipitated by underlying infectious process, bacterial or viral. Patient was started on empiric IV antibiotic therapy, IV steroids, scheduled nebulizers, admitted to the step down unit. She had a CT scan of lungs performed on 01/19/2014 which did not show evidence of pulmonary embolism. The study did reveal patchy areas of groundglass opacity likely secondary to an infectious or inflammatory process. By 01/20/2014 she reported doing much better, transfer orders written for telemetry. Due to to difficult IV access a PICC line was placed on 01/19/2014.                                                                                                                                                     Assessment/Plan: 1. Acute hypoxemic respiratory failure -Patient presented in respiratory distress, breathing 30 breaths per minute requiring BiPAP. -Likely secondary to COPD exacerbation precipitated from underlying infectious process. -CT scan of lungs do not show evidence of pulmonary embolism. Patchy areas of groundglass opacity noted which likely reflects infectious process. Taper steroids. Continue nebs, abx  2. Chronic obstructive pulmonary disease exacerbation See ablve  3. Community acquire pneumonia versus viral pneumonia -Influenza negative -Continue empiric IV antibiotic therapy with  IV ceftriaxone and azithromycin -Blood cultures remain negative  4. Insulin-dependent diabetes mellitus Resume lantus and novolog, d/c insulin gtt  5. Obstructive sleep apnea -NPPV each bedtime  6. IV access -Patient difficult IV access, PICC line ordered  7. Chronic diastolic congestive heart failure -Appears compensated clinically -Continue torsemide 100 mg by mouth twice a day and spironolactone 25 mg by mouth daily    Code Status: Full code Family Communication: Disposition Plan: Transfer to telemetry   Consultants:  Pulmonary critical care medicine  Procedures:  PICC line placement on 01/19/2014  Antibiotics:  Ceftriaxone 1 g IV every 24 hours  Azithromycin 500 mg IV every 24 hours  HPI/Subjective: Still DOE. Cough. hoarse  Objective: Filed Vitals:   01/21/14 1356  BP: 145/81  Pulse: 89  Temp: 97.8 F (36.6 C)  Resp: 20    Intake/Output Summary (Last 24 hours) at 01/21/14 1713 Last data filed at 01/21/14 1600  Gross per 24 hour  Intake 1177.65 ml  Output   2100 ml  Net -922.35 ml   Filed Weights   01/18/14 2347 01/21/14 0333  Weight: 134.8 kg (297 lb  2.9 oz) 138.392 kg (305 lb 1.6 oz)    Exam:   General:  Hoarse. Eating supper. Breathing nonlabored  Cardiovascular: Tachycardic, regular rate rhythm normal S1-S2  Respiratory: Diminished breath sounds bilaterally, mild wheeze. Good air movement  Abdomen: Soft nontender nondistended  Extremities: No edema  Musculoskeletal: Present range of motion to all extremities no joint pain  Data Reviewed: Basic Metabolic Panel:  Recent Labs Lab 01/18/14 2034 01/19/14 0220 01/20/14 0500 01/21/14 0500  NA 133* 133* 133* 138  K 3.5* 3.8 5.2 4.5  CL 93* 92* 91* 94*  CO2 26 22 28 30   GLUCOSE 200* 306* 359* 278*  BUN 14 15 21  27*  CREATININE 0.95 1.02 1.03 1.04  CALCIUM 9.2 9.0 8.9 8.9   Liver Function Tests:  Recent Labs Lab 01/18/14 2034 01/19/14 0220  AST 27 28  ALT 39* 41*   ALKPHOS 122* 115  BILITOT 0.4 0.6  PROT 7.4 7.6  ALBUMIN 3.4* 3.6   No results found for this basename: LIPASE, AMYLASE,  in the last 168 hours No results found for this basename: AMMONIA,  in the last 168 hours CBC:  Recent Labs Lab 01/18/14 2034 01/19/14 0220 01/20/14 0500 01/21/14 0500  WBC 8.1 8.4 12.3* 12.3*  NEUTROABS 6.0 7.5  --   --   HGB 13.1 13.5 12.4 12.2  HCT 39.1 39.9 37.4 37.5  MCV 77.0* 77.8* 78.1 78.8  PLT 158 157 167 170   Cardiac Enzymes:  Recent Labs Lab 01/18/14 2034 01/19/14 0220  TROPONINI <0.30 <0.30   BNP (last 3 results)  Recent Labs  11/16/13 2349 12/09/13 1811 01/18/14 2034  PROBNP 53.5 24.6 802.5*   CBG:  Recent Labs Lab 01/21/14 1125 01/21/14 1244 01/21/14 1351 01/21/14 1520 01/21/14 1635  GLUCAP 236* 200* 245* 278* 207*    Recent Results (from the past 240 hour(s))  URINE CULTURE     Status: None   Collection Time    01/19/14 12:10 AM      Result Value Ref Range Status   Specimen Description URINE, CLEAN CATCH   Final   Special Requests ADDED [phone]   Final   Culture  Setup Time     Final   Value: 01/19/2014 01:08     Performed at [place]     Final   Value: 7,000 COLONIES/ML     Performed at [facility]   Culture     Final   Value: INSIGNIFICANT GROWTH     Performed at [facility]   Report Status 01/19/2014 FINAL   Final  CULTURE, BLOOD (ROUTINE X 2)     Status: None   Collection Time    01/19/14  1:15 AM      Result Value Ref Range Status   Specimen Description BLOOD LEFT ARM   Final   Special Requests BOTTLES DRAWN AEROBIC ONLY 3CC   Final   Culture  Setup Time     Final   Value: 01/19/2014 09:43     Performed at [facility]   Culture     Final   Value:        BLOOD CULTURE RECEIVED NO GROWTH TO DATE CULTURE WILL BE HELD FOR 5 DAYS BEFORE ISSUING A FINAL NEGATIVE REPORT     Performed at [facility]   Report Status PENDING   Incomplete   RESPIRATORY VIRUS PANEL     Status: None   Collection Time    01/19/14  2:13 AM      Result Value Ref Range Status   Source - RVPAN NASAL SWAB   Corrected   Comment: CORRECTED ON 04/27 AT 2141: PREVIOUSLY REPORTED AS NASAL SWAB   Respiratory Syncytial Virus A NOT DETECTED   Final   Respiratory Syncytial Virus B NOT DETECTED   Final   Influenza A NOT DETECTED   Final   Influenza B NOT DETECTED   Final   Parainfluenza 1 NOT DETECTED   Final   Parainfluenza 2 NOT DETECTED   Final   Parainfluenza 3 NOT DETECTED   Final   Metapneumovirus NOT DETECTED   Final   Rhinovirus NOT DETECTED   Final   Adenovirus NOT DETECTED   Final   Influenza A H1 NOT DETECTED   Final   Influenza A H3 NOT DETECTED   Final   Comment: (NOTE)           Normal Reference Range for each Analyte: NOT DETECTED     Testing performed using the Luminex xTAG Respiratory Viral Panel test     kit.     This test was developed and its performance characteristics determined     by [facility]. It has not been cleared or approved by the [place]     Food and Drug Administration. This test is used for clinical purposes.     It should not be regarded as investigational or for research. This     laboratory is certified under the [place] (CLIA) as qualified to perform high complexity     clinical laboratory testing.     Performed at [facility] PCR SCREENING     Status: None   Collection Time    01/19/14  2:14 AM      Result Value Ref Range Status   MRSA by PCR NEGATIVE  NEGATIVE Final   Comment:            The GeneXpert MRSA Assay (FDA     approved for NASAL specimens     only), is one component of a     comprehensive MRSA colonization     surveillance program. It is not     intended to diagnose MRSA     infection nor to guide or     monitor treatment for     MRSA infections.  CULTURE, BLOOD (ROUTINE X 2)     Status: None   Collection Time    01/19/14   2:20 AM      Result Value Ref Range Status   Specimen Description BLOOD LEFT WRIST   Final   Special Requests BOTTLES DRAWN AEROBIC ONLY 4CC   Final   Culture  Setup Time     Final   Value: 01/19/2014 09:48     Performed at [facility]   Culture     Final   Value:        BLOOD CULTURE RECEIVED NO GROWTH TO DATE CULTURE WILL BE HELD FOR 5 DAYS BEFORE ISSUING A FINAL NEGATIVE REPORT     Performed at [facility]   Report Status PENDING   Incomplete     Studies: Ct Angio Chest Pe W/cm &/or Wo Cm  01/19/2014   CLINICAL DATA:  Shortness of breath  EXAM: CT ANGIOGRAPHY CHEST WITH CONTRAST  TECHNIQUE: Multidetector CT imaging of the chest was performed using the standard protocol during bolus administration of intravenous contrast. Multiplanar  CT image reconstructions and MIPs were obtained to evaluate the vascular anatomy.  CONTRAST:  163m OMNIPAQUE IOHEXOL 350 MG/ML SOLN  COMPARISON:  None.  FINDINGS: There is adequate opacification of the central pulmonary arteries with no central pulmonary embolus. The segmental and subsegmental branches are suboptimally opacified and are degraded by patient respiratory motion making these areas nondiagnostic. The main pulmonary artery, right main pulmonary artery and left main pulmonary arteries are normal in size. The heart size is normal. There is no pericardial effusion.  There are patchy areas of ground-glass opacity throughout bilateral lungs which may be secondary to an infectious or inflammatory etiology versus normal expiration with the areas lacking ground-glass opacity reflecting air trapping versus hypersensitivity pneumonitis. There is no pleural effusion or pneumothorax. There is bibasilar mild atelectasis  There is no mediastinal or axillary lymphadenopathy. There is a mildly enlarged right hilar lymph node measuring 12 mm in short axis which may be reactive given the pulmonary findings.  There is no lytic or blastic osseous lesion.  The  visualized portions of the upper abdomen are unremarkable.  Review of the MIP images confirms the above findings.  IMPRESSION: 1. Adequate opacification of the central pulmonary arteries with no central pulmonary embolus. The segmental and subsegmental branches are suboptimally opacified and are degraded by patient respiratory motion making these areas nondiagnostic.  2. Patchy areas of ground-glass opacity throughout bilateral lungs which may be secondary to an infectious or inflammatory etiology versus normal expiration with the areas lacking ground-glass opacity reflecting air trapping versus hypersensitivity pneumonitis.   Electronically Signed   By: HKathreen Devoid  On: 01/19/2014 18:24    Scheduled Meds: . ALPRAZolam  1 mg Oral TID  . aspirin EC  81 mg Oral Daily  . azithromycin  500 mg Oral Q24H  . budesonide (PULMICORT) nebulizer solution  0.5 mg Nebulization BID  . cefTRIAXone (ROCEPHIN)  IV  1 g Intravenous Q24H  . enoxaparin (LOVENOX) injection  60 mg Subcutaneous Q24H  . insulin regular  0-10 Units Intravenous TID WC  . ipratropium-albuterol  3 mL Nebulization Q6H  . loratadine  10 mg Oral Daily  . methylPREDNISolone (SOLU-MEDROL) injection  60 mg Intravenous Q12H  . nicotine  14 mg Transdermal Daily  . pantoprazole  40 mg Oral Daily  . potassium chloride SA  40 mEq Oral BID  . sertraline  100 mg Oral Daily  . sodium chloride  10-40 mL Intracatheter Q12H  . sodium chloride  3 mL Intravenous Q12H  . spironolactone  25 mg Oral BID  . torsemide  100 mg Oral BID   Continuous Infusions: . sodium chloride 50 mL/hr at 01/21/14 0345  . insulin (NOVOLIN-R) infusion 41.4 Units/hr (01/21/14 1530)    Active Problems:   HYPERTENSION   OBSTRUCTIVE SLEEP APNEA   Diabetes type 2, uncontrolled   Tobacco abuse   Acute-on-chronic respiratory failure   Chronic diastolic CHF (congestive heart failure)   Asthma exacerbation in COPD   Hypokalemia   COPD exacerbation   HCAP  (healthcare-associated pneumonia)    Time spent: 35 minutes  CDelfina Redwood MD  [facility] Pager [phone]-7AM, please contact night-coverage at [email or web address], password [facility]4/29/2015, 5:13 PM  LOS: 3 days

## 2014-01-21 NOTE — Progress Notes (Signed)
PHARMACY NOTE  CONSULT :  Renal Adjustment of Antibiotics, Azithromycin and Ceftriaxone INDICATION :  CAP  ASSESSMENT:  Pharmacy consulted for Renal adjustment of Azithromycin and Ceftriaxone..     Dosing Weight  138 kg,  SCr 1, estimated CrCl  > 100 ml/min  Neither Azithromycin nor Ceftriaxone require any dose adjustments.   PLAN:  1. Continue Azithromycin nor Ceftriaxone as previously ordered. 2. Pharmacy will sign off and follow peripherally given no adjustments in doses or schedules are expected.   3. Pharmacy has alerts in place to alert for dramatic changes in renal function or clinical condition that might require dose or schedule adjustments. 4. Please re-consult if additional assistance is needed.  Thank you for allowing Pharmacy to participate in this patient's care   Estelle June,  Pharm.D. ,  01/21/2014,  4:28 PM

## 2014-01-22 DIAGNOSIS — R7989 Other specified abnormal findings of blood chemistry: Secondary | ICD-10-CM | POA: Diagnosis present

## 2014-01-22 LAB — CBC
HCT: 39.4 % (ref 36.0–46.0)
Hemoglobin: 12.5 g/dL (ref 12.0–15.0)
MCH: 25.3 pg — ABNORMAL LOW (ref 26.0–34.0)
MCHC: 31.7 g/dL (ref 30.0–36.0)
MCV: 79.6 fL (ref 78.0–100.0)
Platelets: 183 10*3/uL (ref 150–400)
RBC: 4.95 MIL/uL (ref 3.87–5.11)
RDW: 19.9 % — ABNORMAL HIGH (ref 11.5–15.5)
WBC: 11.7 10*3/uL — ABNORMAL HIGH (ref 4.0–10.5)

## 2014-01-22 LAB — GLUCOSE, CAPILLARY
GLUCOSE-CAPILLARY: 390 mg/dL — AB (ref 70–99)
GLUCOSE-CAPILLARY: 286 mg/dL — AB (ref 70–99)
GLUCOSE-CAPILLARY: 299 mg/dL — AB (ref 70–99)
GLUCOSE-CAPILLARY: 222 mg/dL — AB (ref 70–99)
GLUCOSE-CAPILLARY: 384 mg/dL — AB (ref 70–99)
GLUCOSE-CAPILLARY: 322 mg/dL — AB (ref 70–99)
Glucose-Capillary: 218 mg/dL — ABNORMAL HIGH (ref 70–99)
Glucose-Capillary: 239 mg/dL — ABNORMAL HIGH (ref 70–99)
Glucose-Capillary: 241 mg/dL — ABNORMAL HIGH (ref 70–99)
Glucose-Capillary: 261 mg/dL — ABNORMAL HIGH (ref 70–99)
Glucose-Capillary: 329 mg/dL — ABNORMAL HIGH (ref 70–99)
Glucose-Capillary: 353 mg/dL — ABNORMAL HIGH (ref 70–99)

## 2014-01-22 LAB — PHOSPHORUS: PHOSPHORUS: 4.7 mg/dL — AB (ref 2.3–4.6)

## 2014-01-22 LAB — BASIC METABOLIC PANEL
BUN: 31 mg/dL — ABNORMAL HIGH (ref 6–23)
BUN: 32 mg/dL — ABNORMAL HIGH (ref 6–23)
CO2: 32 mEq/L (ref 19–32)
CO2: 33 mEq/L — ABNORMAL HIGH (ref 19–32)
Calcium: 10.2 mg/dL (ref 8.4–10.5)
Calcium: 9.3 mg/dL (ref 8.4–10.5)
Chloride: 82 mEq/L — ABNORMAL LOW (ref 96–112)
Chloride: 87 mEq/L — ABNORMAL LOW (ref 96–112)
Creatinine, Ser: 1.2 mg/dL — ABNORMAL HIGH (ref 0.50–1.10)
Creatinine, Ser: 1.09 mg/dL (ref 0.50–1.10)
GFR calc Af Amer: 62 mL/min — ABNORMAL LOW (ref >90)
GFR calc Af Amer: 69 mL/min — ABNORMAL LOW (ref >90)
GFR calc non Af Amer: 53 mL/min — ABNORMAL LOW (ref >90)
GFR calc non Af Amer: 60 mL/min — ABNORMAL LOW (ref >90)
Glucose, Bld: 430 mg/dL — ABNORMAL HIGH (ref 70–99)
Glucose, Bld: 271 mg/dL — ABNORMAL HIGH (ref 70–99)
Potassium: 4.6 mEq/L (ref 3.7–5.3)
Potassium: 4.8 mEq/L (ref 3.7–5.3)
Sodium: 129 mEq/L — ABNORMAL LOW (ref 137–147)
Sodium: 134 mEq/L — ABNORMAL LOW (ref 137–147)

## 2014-01-22 LAB — TSH: TSH: 0.086 u[IU]/mL — ABNORMAL LOW (ref 0.350–4.500)

## 2014-01-22 LAB — MAGNESIUM: MAGNESIUM: 2.2 mg/dL (ref 1.5–2.5)

## 2014-01-22 MED ORDER — SODIUM CHLORIDE 0.9 % IV SOLN
INTRAVENOUS | Status: DC
  Administered 2014-01-22: 3.3 [IU]/h via INTRAVENOUS
  Filled 2014-01-22 (×2): qty 1

## 2014-01-22 MED ORDER — INSULIN ASPART 100 UNIT/ML ~~LOC~~ SOLN: 15.0000 [IU] | Freq: Three times a day (TID) | SUBCUTANEOUS | Status: DC

## 2014-01-22 MED ORDER — SODIUM CHLORIDE 0.9 % IV SOLN
INTRAVENOUS | Status: DC
  Administered 2014-01-22: 05:00:00 via INTRAVENOUS

## 2014-01-22 MED ORDER — SODIUM CHLORIDE 0.9 % IV SOLN: INTRAVENOUS | Status: DC

## 2014-01-22 MED ORDER — INSULIN ASPART 100 UNIT/ML ~~LOC~~ SOLN
20.0000 [IU] | Freq: Three times a day (TID) | SUBCUTANEOUS | Status: DC
  Administered 2014-01-22 – 2014-01-23 (×2): 20 [IU] via SUBCUTANEOUS

## 2014-01-22 MED ORDER — INSULIN GLARGINE 100 UNIT/ML ~~LOC~~ SOLN
50.0000 [IU] | Freq: Two times a day (BID) | SUBCUTANEOUS | Status: DC
  Administered 2014-01-22 – 2014-01-25 (×6): 50 [IU] via SUBCUTANEOUS
  Filled 2014-01-22 (×7): qty 0.5

## 2014-01-22 MED ORDER — POTASSIUM CHLORIDE 10 MEQ/100ML IV SOLN
10.0000 meq | Freq: Once | INTRAVENOUS | Status: AC
  Administered 2014-01-22: 10 meq via INTRAVENOUS
  Filled 2014-01-22: qty 100

## 2014-01-22 MED ORDER — POTASSIUM CHLORIDE 10 MEQ/100ML IV SOLN
10.0000 meq | INTRAVENOUS | Status: AC
  Administered 2014-01-22: 10 meq via INTRAVENOUS
  Filled 2014-01-22 (×2): qty 100

## 2014-01-22 MED ORDER — GUAIFENESIN ER 600 MG PO TB12
600.0000 mg | ORAL_TABLET | Freq: Two times a day (BID) | ORAL | Status: DC
  Administered 2014-01-22 – 2014-01-25 (×6): 600 mg via ORAL
  Filled 2014-01-22 (×8): qty 1

## 2014-01-22 MED ORDER — INSULIN ASPART 100 UNIT/ML ~~LOC~~ SOLN
0.0000 [IU] | SUBCUTANEOUS | Status: DC
Start: 2014-01-22 — End: 2014-01-25
  Administered 2014-01-22: 20 [IU] via SUBCUTANEOUS
  Administered 2014-01-22 (×2): 15 [IU] via SUBCUTANEOUS
  Administered 2014-01-22: 7 [IU] via SUBCUTANEOUS
  Administered 2014-01-23: 4 [IU] via SUBCUTANEOUS
  Administered 2014-01-23: 7 [IU] via SUBCUTANEOUS
  Administered 2014-01-23: 4 [IU] via SUBCUTANEOUS
  Administered 2014-01-23 (×2): 7 [IU] via SUBCUTANEOUS
  Administered 2014-01-24: 20 [IU] via SUBCUTANEOUS
  Administered 2014-01-24 (×2): 7 [IU] via SUBCUTANEOUS
  Administered 2014-01-24 – 2014-01-25 (×3): 4 [IU] via SUBCUTANEOUS

## 2014-01-22 MED ORDER — INSULIN GLARGINE 100 UNIT/ML ~~LOC~~ SOLN
40.0000 [IU] | Freq: Two times a day (BID) | SUBCUTANEOUS | Status: DC
  Administered 2014-01-22: 40 [IU] via SUBCUTANEOUS
  Filled 2014-01-22 (×2): qty 0.4

## 2014-01-22 MED ORDER — DEXTROSE 50 % IV SOLN: 25.0000 mL | INTRAVENOUS | Status: DC | PRN

## 2014-01-22 MED ORDER — IPRATROPIUM-ALBUTEROL 0.5-2.5 (3) MG/3ML IN SOLN
3.0000 mL | RESPIRATORY_TRACT | Status: DC
  Administered 2014-01-22 – 2014-01-25 (×17): 3 mL via RESPIRATORY_TRACT
  Filled 2014-01-22 (×19): qty 3

## 2014-01-22 NOTE — Progress Notes (Signed)
Pt instructed on use of flutter device, Pt demonstrated with very good technique.  Pt has use flutter valve before.  RT to monitor and assess as needed.

## 2014-01-22 NOTE — Progress Notes (Signed)
Overnight, back on IV insulin for severe hyperglycemia. [place] ok. [employer] and Ice cream wrappers found in room  [facility] PROGRESS NOTE  [name] JOI:[id number] DOB: [birth year] DOA: 01/18/2014 PCP: Gwendolyn Grant, MD  Interim Summary Patient is a pleasant 49 year old female with a past medical history of chronic obstructive pulmonary disease, ongoing tobacco abuse, type 2 diabetes mellitus admitted to the medicine service on 01/18/2014. She presented with complaints of shortness of breath, cough, wheezing, found to be in acute hypoxemic respiratory failure. Initial chest x-ray revealed findings that could be suspicious for pneumonia. It was felt that she had a COPD exacerbation precipitated by underlying infectious process, bacterial or viral. Patient was started on empiric IV antibiotic therapy, IV steroids, scheduled nebulizers, admitted to the step down unit. She had a CT scan of lungs performed on 01/19/2014 which did not show evidence of pulmonary embolism. The study did reveal patchy areas of groundglass opacity likely secondary to an infectious or inflammatory process. By 01/20/2014 she reported doing much better, transfer orders written for telemetry. Due to to difficult IV access a PICC line was placed on 01/19/2014.                                                                                                                                                     Assessment/Plan: 1. Acute hypoxemic respiratory failure -Patient presented in respiratory distress, breathing 30 breaths per minute requiring BiPAP. -Likely secondary to COPD exacerbation precipitated from underlying infectious process. -CT scan of lungs do not show evidence of pulmonary embolism. Patchy areas of groundglass opacity noted which likely reflects infectious process. Taper steroids. Continue nebs, abx  2. Chronic obstructive pulmonary disease exacerbation See ablve  3. Community acquire  pneumonia versus viral pneumonia -Influenza negative -Continue empiric IV antibiotic therapy with IV ceftriaxone and azithromycin -Blood cultures remain negative  4. Insulin-dependent diabetes mellitus, uncontrolled Difficult to controlled, required repeat institution of insulin drip. We'll try again to transition to subcutaneous. Increase Lantus and NovoLog. Encouraged compliance with diet.  5. Obstructive sleep apnea -NPPV each bedtime  7. Chronic diastolic congestive heart failure -Appears compensated clinically -Continue torsemide 100 mg by mouth twice a day and spironolactone 25 mg by mouth daily  Code Status: Full code Family Communication: Disposition Plan:    Consultants:  Pulmonary critical care medicine  Procedures:  PICC line placement on 01/19/2014  Antibiotics:  Ceftriaxone 1 g IV every 24 hours  Azithromycin 500 mg IV every 24 hours  HPI/Subjective: Still DOE.   Objective: Filed Vitals:   01/22/14 1352  BP: 116/49  Pulse: 81  Temp: 98.7 F (37.1 C)  Resp: 18    Intake/Output Summary (Last 24 hours) at 01/22/14 1459 Last data filed at 01/22/14 1230  Gross per 24 hour  Intake 1258.45 ml  Output   2350 ml  Net -  1091.55 ml   Filed Weights   01/18/14 2347 01/21/14 0333  Weight: 134.8 kg (297 lb 2.9 oz) 138.392 kg (305 lb 1.6 oz)    Exam:   General:  Eating crackers and peanut butter. Breathing nonlabored.  Cardiovascular: Tachycardic, regular rate rhythm normal S1-S2  Respiratory: Diminished breath sounds bilaterally, no bruits. Good air movement  Abdomen: Soft nontender nondistended  Extremities: No edema  Data Reviewed: Basic Metabolic Panel:  Recent Labs Lab 01/19/14 0220 01/20/14 0500 01/21/14 0500 01/22/14 0148 01/22/14 0600  NA 133* 133* 138 129* 134*  K 3.8 5.2 4.5 4.6 4.8  CL 92* 91* 94* 82* 87*  CO2 _0 32 33*  GLUCOSE 306* 359* 278* 430* 271*  BUN 15 21 27* 31* 32*  CREATININE 1.02 1.03 1.04 1.20* 1.09   CALCIUM 9.0 8.9 8.9 10.2 9.3  MG  --   --   --  2.2  --   PHOS  --   --   --  4.7*  --    Liver Function Tests:  Recent Labs Lab 01/18/14 2034 01/19/14 0220  AST 27 28  ALT 39* 41*  ALKPHOS 122* 115  BILITOT 0.4 0.6  PROT 7.4 7.6  ALBUMIN 3.4* 3.6   No results found for this basename: LIPASE, AMYLASE,  in the last 168 hours No results found for this basename: AMMONIA,  in the last 168 hours CBC:  Recent Labs Lab 01/18/14 2034 01/19/14 0220 01/20/14 0500 01/21/14 0500 01/22/14 0324  WBC 8.1 8.4 12.3* 12.3* 11.7*  NEUTROABS 6.0 7.5  --   --   --   HGB 13.1 13.5 12.4 12.2 12.5  HCT 39.1 39.9 37.4 37.5 39.4  MCV 77.0* 77.8* 78.1 78.8 79.6  PLT 158 157 167 170 183   Cardiac Enzymes:  Recent Labs Lab 01/18/14 2034 01/19/14 0220  TROPONINI <0.30 <0.30   BNP (last 3 results)  Recent Labs  11/16/13 2349 12/09/13 1811 01/18/14 2034  PROBNP 53.5 24.6 802.5*   CBG:  Recent Labs Lab 01/22/14 0906 01/22/14 1012 01/22/14 1118 01/22/14 1309 01/22/14 1450  GLUCAP 239* 218* 222* 384* 353*    Recent Results (from the past 240 hour(s))  URINE CULTURE     Status: None   Collection Time    01/19/14 12:10 AM      Result Value Ref Range Status   Specimen Description URINE, CLEAN CATCH   Final   Special Requests ADDED [phone]   Final   Culture  Setup Time     Final   Value: 01/19/2014 01:08     Performed at [facility]     Final   Value: 7,000 COLONIES/ML     Performed at [facility]   Culture     Final   Value: INSIGNIFICANT GROWTH     Performed at [facility]   Report Status 01/19/2014 FINAL   Final  CULTURE, BLOOD (ROUTINE X 2)     Status: None   Collection Time    01/19/14  1:15 AM      Result Value Ref Range Status   Specimen Description BLOOD LEFT ARM   Final   Special Requests BOTTLES DRAWN AEROBIC ONLY 3CC   Final   Culture  Setup Time     Final   Value: 01/19/2014 09:43     Performed at [facility]   Culture     Final   Value:  BLOOD CULTURE RECEIVED NO GROWTH TO DATE CULTURE WILL BE HELD FOR 5 DAYS BEFORE ISSUING A FINAL NEGATIVE REPORT     Performed at [facility]   Report Status PENDING   Incomplete  RESPIRATORY VIRUS PANEL     Status: None   Collection Time    01/19/14  2:13 AM      Result Value Ref Range Status   Source - RVPAN NASAL SWAB   Corrected   Comment: CORRECTED ON 04/27 AT 2141: PREVIOUSLY REPORTED AS NASAL SWAB   Respiratory Syncytial Virus A NOT DETECTED   Final   Respiratory Syncytial Virus B NOT DETECTED   Final   Influenza A NOT DETECTED   Final   Influenza B NOT DETECTED   Final   Parainfluenza 1 NOT DETECTED   Final   Parainfluenza 2 NOT DETECTED   Final   Parainfluenza 3 NOT DETECTED   Final   Metapneumovirus NOT DETECTED   Final   Rhinovirus NOT DETECTED   Final   Adenovirus NOT DETECTED   Final   Influenza A H1 NOT DETECTED   Final   Influenza A H3 NOT DETECTED   Final   Comment: (NOTE)           Normal Reference Range for each Analyte: NOT DETECTED     Testing performed using the Luminex xTAG Respiratory Viral Panel test     kit.     This test was developed and its performance characteristics determined     by [facility]. It has not been cleared or approved by the [place]     Food and Drug Administration. This test is used for clinical purposes.     It should not be regarded as investigational or for research. This     laboratory is certified under the [place] (CLIA) as qualified to perform high complexity     clinical laboratory testing.     Performed at [facility] PCR SCREENING     Status: None   Collection Time    01/19/14  2:14 AM      Result Value Ref Range Status   MRSA by PCR NEGATIVE  NEGATIVE Final   Comment:            The GeneXpert MRSA Assay (FDA     approved for NASAL specimens     only), is one component of a     comprehensive MRSA  colonization     surveillance program. It is not     intended to diagnose MRSA     infection nor to guide or     monitor treatment for     MRSA infections.  CULTURE, BLOOD (ROUTINE X 2)     Status: None   Collection Time    01/19/14  2:20 AM      Result Value Ref Range Status   Specimen Description BLOOD LEFT WRIST   Final   Special Requests BOTTLES DRAWN AEROBIC ONLY 4CC   Final   Culture  Setup Time     Final   Value: 01/19/2014 09:48     Performed at [facility]   Culture     Final   Value:        BLOOD CULTURE RECEIVED NO GROWTH TO DATE CULTURE WILL BE HELD FOR 5 DAYS BEFORE ISSUING A FINAL NEGATIVE REPORT     Performed at [facility]   Report  Status PENDING   Incomplete     Studies: No results found.  Scheduled Meds: . ALPRAZolam  1 mg Oral TID  . aspirin EC  81 mg Oral Daily  . azithromycin  500 mg Oral Q24H  . budesonide (PULMICORT) nebulizer solution  0.5 mg Nebulization BID  . cefTRIAXone (ROCEPHIN)  IV  1 g Intravenous Q24H  . enoxaparin (LOVENOX) injection  60 mg Subcutaneous Q24H  . glimepiride  1 mg Oral QAC breakfast  . guaiFENesin  600 mg Oral BID  . insulin aspart  0-20 Units Subcutaneous 6 times per day  . insulin aspart  20 Units Subcutaneous TID WC  . insulin glargine  50 Units Subcutaneous BID  . ipratropium-albuterol  3 mL Nebulization Q4H  . Liraglutide  18 mg Subcutaneous Daily  . loratadine  10 mg Oral Daily  . methylPREDNISolone (SOLU-MEDROL) injection  60 mg Intravenous Q12H  . nicotine  14 mg Transdermal Daily  . pantoprazole  40 mg Oral Daily  . potassium chloride SA  40 mEq Oral BID  . sertraline  100 mg Oral Daily  . sodium chloride  10-40 mL Intracatheter Q12H  . spironolactone  25 mg Oral BID  . torsemide  100 mg Oral BID   Continuous Infusions: . sodium chloride 10 mL/hr at 01/22/14 0754  . insulin (NOVOLIN-R) infusion 3.3 Units/hr (01/22/14 0452)   Time spent: 35 minutes  Delfina Redwood, MD  [facility] Pager [phone] 7PM-7AM, please contact night-coverage at [email or web address], password [facility] 01/22/2014, 2:59 PM  LOS: 4 days

## 2014-01-22 NOTE — Progress Notes (Signed)
Inpatient Diabetes Program Recommendations  AACE/ADA: New Consensus Statement on Inpatient Glycemic Control (2013)  Target Ranges:  Prepandial:   less than 140 mg/dL      Peak postprandial:   less than 180 mg/dL (1-2 hours)      Critically ill patients:  140 - 180 mg/dL   Diabetes history: DM2  Outpatient Diabetes medications: Lantus 20 units QHS, Lantus 20 units QAM (if CBG greater than 200 mg/dl), Novolog 0-10 units TID, Victoza 18 mg daily, Amaryl 1mg  QAM  Current orders for Inpatient glycemic control: Lantus 40 units BID,Novolin R (insulin drip)  Inpatient Diabetes Program Recommendations Insulin - IV drip/GlucoStabilizer: Noted new order for Lantus to transition off IV insulin to SQ. Correction (SSI): Please order Novolog resistant correction scale Q4H. Insulin - Meal Coverage: Please ordering Novolog 10 units TID with meals for meal coverage.   NURSING: Basal SQ (Lantus) insulin must be given 1-2 hours prior to discontinuation of insulin drip and Novolog SQ correction scale must be administered simultaneously when the drip is discontinued.  Thanks, Barnie Alderman, RN, MSN, CCRN Diabetes Coordinator Inpatient Diabetes Program [phone] (Team Pager) [phone] ([facility] office) [phone] [facility] office)

## 2014-01-22 NOTE — Progress Notes (Addendum)
During hourly rounds [employer] cookie, burger, and ice cream sundae wrappers observed on pt table and in trash can. Pt denies eating these foods. Pt states " My sister ate all that, all I have eaten was some graham crackers and an orange". Pt's sister also testifies that the food belonged to her and not the pt. NT went to check pt BS and value was 584. RN notified MD. Order given for STAT Labs, NPO status and to hold sliding scale insulin at midnight. Pt resting in bed with call bell within reach. Will continue to monitor.   Coolidge Breeze, RN

## 2014-01-22 NOTE — Progress Notes (Signed)
Pt wants CPAP at midnight,  RT to monitor and assess as needed.

## 2014-01-22 NOTE — Progress Notes (Signed)
Pt refusing CPAP for tonight.  RT will continue to monitor.  

## 2014-01-22 NOTE — Progress Notes (Signed)
PULMONARY / CRITICAL CARE MEDICINE   Name: [name] MRN: [id number] DOB: 11/04/1965    ADMISSION DATE:  01/18/2014 CONSULTATION DATE:  01/19/14  REFERRING MD :  Dr. Roel Cluck PRIMARY SERVICE: Hospitalist  CHIEF COMPLAINT:  dyspnea  BRIEF PATIENT DESCRIPTION: Morbidly obese patient with known COPD + OSA on home CPAP and oxygen, diastolic HF, possible pulm HTN, coming in with progressive dyspnea likely COPD exac due to viral illness. Active smoker. 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  SIGNIFICANT EVENTS / STUDIES:  4/27 - BiPAP initiated Feb 04, 2023 - to floor 4/29 - refusing BiPAP  LINES / TUBES: PIV  CULTURES: BCx2 4/27>>> RVP 4/27>>>neg UC 4/27>>>neg   ANTIBIOTICS: Vancomycin 4/26 Zosyn 4/26 Azithromycin 4/27>>> Rocephin 4/27>>>  SUBJECTIVE: Anxious overnight.  Initially refused CPAP but later agreed.  Was concerned about having panic attack.  RN noted cookie, burger, and ice cream wrappers on table and in trash can.  Pt stated that they belonged to sister; however, her CBG was 584.   Otherwise, vitals stable.  VITAL SIGNS: Temp:  [97.8 F (36.6 C)-98.1 F (36.7 C)] 98.1 F (36.7 C) (04/30 0420) Pulse Rate:  [86-97] 91 (04/30 0420) Resp:  [18-26] 22 (04/30 0420) BP: (110-145)/(60-85) 139/85 mmHg (04/30 0420) SpO2:  [85 %-99 %] 90 % (04/30 0420)  INTAKE / OUTPUT: Intake/Output     04/29 0701 - 04/30 0700   P.O. 840   I.V. (mL/kg) 538.5 (3.9)   Total Intake(mL/kg) 1378.5 (10)   Urine (mL/kg/hr) 2200 (0.7)   Total Output 2200   Net -821.6       Urine Occurrence 3 x     PHYSICAL EXAMINATION: General:  Asleep but easily arouseable.  Anxious. Neuro:  Sleepy so unable to assess neuro status.  No obvious focal deficits. HEENT:  Atraumatic, trachea midline, thick neck, no enlarged neck LN Cardiovascular:  RRR Lungs:  Decreased breath sounds bilaterally with prolonged expiration, moderate expiratory wheeze. Abdomen:  Soft, nontender, no  guarding Musculoskeletal:  No deformity, no calf tenderness, trace edema Skin:  No acute rash, no sacral wound  LABS:  CBC  Recent Labs Lab 01/19/14 0220 2014/02/03 0500 01/21/14 0500  WBC 8.4 12.3* 12.3*  HGB 13.5 12.4 12.2  HCT 39.9 37.4 37.5  PLT 157 167 170   BMET  Recent Labs Lab 2014/02/03 0500 01/21/14 0500 01/22/14 0148  NA 133* 138 129*  K 5.2 4.5 4.6  CL 91* 94* 82*  CO2 28 30 32  BUN 21 27* 31*  CREATININE 1.03 1.04 1.20*  GLUCOSE 359* 278* 430*   Liver Enzymes  Recent Labs Lab 01/18/14 2034 01/19/14 0220  AST 27 28  ALT 39* 41*  ALKPHOS 122* 115  BILITOT 0.4 0.6  ALBUMIN 3.4* 3.6   Glucose  Recent Labs Lab 01/21/14 1351 01/21/14 1520 01/21/14 1635 01/21/14 1742 01/21/14 2016 01/21/14 2354  GLUCAP 245* 278* 207* 215* 344* 584*    Imaging No results found.  ASSESSMENT / PLAN:  PULMONARY A:  Acute COPD exacerbation - in an active smoker, likely triggered by viral illness, requiring BiPAP.  Less likely bacterial PNA at this point. 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 - Scheduled duoneb (changed from q6 to q4) + pulmicort. - Will continue IV Solumedrol given moderate wheezing.  Consider transition to prednisone in AM 5/1 if wheezing improves. - Continue rocephin 4/8 and azithro 4/5.  If patient develops high fever or green/yellow sputum, will consider broadening abx coverage. - Pending sputum culture, resp viral panel negative and all other cultures negative. - Strongly advised to attempt to lose weight and stop smoking, nicotine patch. - Blood sugar control. - Recommend diureses as BP and renal function allows. - Mobilize as able. - Pulmonary hygiene.  Montey Hora, [facility] Pgr: [phone]  or [phone]  Above noted edited in full.  Patient seen and examined, agree with  above note.  I dictated the care and orders written for this patient under my direction.  Rush Farmer, MD [phone]

## 2014-01-22 NOTE — Progress Notes (Signed)
Pt stated she felt like she needed ativan before she went on CPAP. At first she was refusing but after talking to her she stated she would wear it but was scared of having a panic attack. She spoke with RN about medications and pt was given medication at this time. PT is tolerating well at this time. She will call if any complications. RT will continue to monitor.

## 2014-01-23 LAB — BASIC METABOLIC PANEL
BUN: 33 mg/dL — ABNORMAL HIGH (ref 6–23)
CHLORIDE: 88 meq/L — AB (ref 96–112)
CO2: 37 meq/L — AB (ref 19–32)
Calcium: 9.5 mg/dL (ref 8.4–10.5)
Creatinine, Ser: 1.03 mg/dL (ref 0.50–1.10)
GFR calc Af Amer: 74 mL/min — ABNORMAL LOW (ref >90)
GFR calc non Af Amer: 64 mL/min — ABNORMAL LOW (ref >90)
Glucose, Bld: 202 mg/dL — ABNORMAL HIGH (ref 70–99)
POTASSIUM: 4.5 meq/L (ref 3.7–5.3)
Sodium: 138 mEq/L (ref 137–147)

## 2014-01-23 LAB — GLUCOSE, CAPILLARY
GLUCOSE-CAPILLARY: 206 mg/dL — AB (ref 70–99)
Glucose-Capillary: 207 mg/dL — ABNORMAL HIGH (ref 70–99)
Glucose-Capillary: 216 mg/dL — ABNORMAL HIGH (ref 70–99)
Glucose-Capillary: 165 mg/dL — ABNORMAL HIGH (ref 70–99)
Glucose-Capillary: 188 mg/dL — ABNORMAL HIGH (ref 70–99)

## 2014-01-23 LAB — T3, FREE: T3, Free: 2.6 pg/mL (ref 2.3–4.2)

## 2014-01-23 LAB — T4, FREE: Free T4: 1.22 ng/dL (ref 0.80–1.80)

## 2014-01-23 MED ORDER — PREDNISONE 20 MG PO TABS
40.0000 mg | ORAL_TABLET | Freq: Every day | ORAL | Status: DC
  Administered 2014-01-24 – 2014-01-25 (×2): 40 mg via ORAL
  Filled 2014-01-23 (×4): qty 2

## 2014-01-23 MED ORDER — ALPRAZOLAM 0.5 MG PO TABS
1.0000 mg | ORAL_TABLET | Freq: Three times a day (TID) | ORAL | Status: DC | PRN
  Administered 2014-01-23 – 2014-01-24 (×2): 1 mg via ORAL
  Filled 2014-01-23 (×2): qty 2

## 2014-01-23 MED ORDER — LIRAGLUTIDE 18 MG/3ML ~~LOC~~ SOPN
0.6000 mg | PEN_INJECTOR | Freq: Every day | SUBCUTANEOUS | Status: DC
  Administered 2014-01-23: 0.6 mg via SUBCUTANEOUS

## 2014-01-23 NOTE — Progress Notes (Signed)
Amb. Patient in hallway and 02 sats on 4 l/m n.c. Range from 87%-92% amb. Approx. 150 feet. H.R. 94-97

## 2014-01-23 NOTE — Progress Notes (Addendum)
Overnight, back on IV insulin for severe hyperglycemia. [place] ok. [employer] and Ice cream wrappers found in room  [facility] PROGRESS NOTE  [name] JQZ:[id number] DOB: 1966/05/11 DOA: 01/18/2014 PCP: Gwendolyn Grant, MD  Interim Summary Patient is a pleasant 48 year old female with a past medical history of chronic obstructive pulmonary disease, ongoing tobacco abuse, type 2 diabetes mellitus admitted to the medicine service on 01/18/2014. She presented with complaints of shortness of breath, cough, wheezing, found to be in acute hypoxemic respiratory failure. Initial chest x-ray revealed findings that could be suspicious for pneumonia. It was felt that she had a COPD exacerbation precipitated by underlying infectious process, bacterial or viral. Patient was started on empiric IV antibiotic therapy, IV steroids, scheduled nebulizers, admitted to the step down unit. She had a CT scan of lungs performed on 01/19/2014 which did not show evidence of pulmonary embolism. The study did reveal patchy areas of groundglass opacity likely secondary to an infectious or inflammatory process. By 01/20/2014 she reported doing much better, transfer orders written for telemetry. Due to to difficult IV access a PICC line was placed on 01/19/2014.                                                                                                                                                     Assessment/Plan: 1. Acute hypoxemic respiratory failure -Patient presented in respiratory distress, breathing 30 breaths per minute requiring BiPAP. -Likely secondary to COPD exacerbation precipitated from underlying infectious process. -CT scan of lungs do not show evidence of pulmonary embolism. Patchy areas of groundglass opacity noted which likely reflects infectious process. Taper steroids. Continue nebs, abx  2. Chronic obstructive pulmonary disease exacerbation See ablve  3. Community acquire  pneumonia versus viral pneumonia -Influenza negative -Continue empiric IV antibiotic therapy with IV ceftriaxone and azithromycin -Blood cultures remain negative  4. Insulin-dependent diabetes mellitus, uncontrolled Difficult to controlled, required repeat institution of insulin drip. We'll try again to transition to subcutaneous. Increase Lantus and NovoLog. Encouraged compliance with diet.  5. Obstructive sleep apnea -NPPV each bedtime  7. Chronic diastolic congestive heart failure -Appears compensated clinically -Continue torsemide 100 mg by mouth twice a day and spironolactone 25 mg by mouth daily  Change to po steroids  increase activity. Home in am if stable  Code Status: Full code Family Communication: Disposition Plan:    Consultants:  Pulmonary critical care medicine  Procedures:  PICC line placement on 01/19/2014  Antibiotics:  Ceftriaxone 1 g IV every 24 hours  Azithromycin 500 mg IV every 24 hours  HPI/Subjective: Still DOE.   Objective: Filed Vitals:   01/23/14 1311  BP: 135/85  Pulse: 89  Temp: 98.2 F (36.8 C)  Resp: 18    Intake/Output Summary (Last 24 hours) at 01/23/14 1428 Last data filed at 01/23/14 1230  Gross per 24  hour  Intake    600 ml  Output   9651 ml  Net  -9051 ml   Filed Weights   01/18/14 2347 01/21/14 0333 01/23/14 0328  Weight: 134.8 kg (297 lb 2.9 oz) 138.392 kg (305 lb 1.6 oz) 137.3 kg (302 lb 11.1 oz)    Exam:   General:  Eating crackers and peanut butter. Breathing nonlabored.  Cardiovascular: Tachycardic, regular rate rhythm normal S1-S2  Respiratory: Diminished breath sounds bilaterally, no bruits. Good air movement  Abdomen: Soft nontender nondistended  Extremities: No edema  Data Reviewed: Basic Metabolic Panel:  Recent Labs Lab 01/20/14 0500 01/21/14 0500 01/22/14 0148 01/22/14 0600 01/23/14 0550  NA 133* 138 129* 134* 138  K 5.2 4.5 4.6 4.8 4.5  CL 91* 94* 82* 87* 88*  CO2 28 30 32 33*  37*  GLUCOSE 359* 278* 430* 271* 202*  BUN 21 27* 31* 32* 33*  CREATININE 1.03 1.04 1.20* 1.09 1.03  CALCIUM 8.9 8.9 10.2 9.3 9.5  MG  --   --  2.2  --   --   PHOS  --   --  4.7*  --   --    Liver Function Tests:  Recent Labs Lab 01/18/14 2034 01/19/14 0220  AST 27 28  ALT 39* 41*  ALKPHOS 122* 115  BILITOT 0.4 0.6  PROT 7.4 7.6  ALBUMIN 3.4* 3.6   No results found for this basename: LIPASE, AMYLASE,  in the last 168 hours No results found for this basename: AMMONIA,  in the last 168 hours CBC:  Recent Labs Lab 01/18/14 2034 01/19/14 0220 01/20/14 0500 01/21/14 0500 01/22/14 0324  WBC 8.1 8.4 12.3* 12.3* 11.7*  NEUTROABS 6.0 7.5  --   --   --   HGB 13.1 13.5 12.4 12.2 12.5  HCT 39.1 39.9 37.4 37.5 39.4  MCV 77.0* 77.8* 78.1 78.8 79.6  PLT 158 157 167 170 183   Cardiac Enzymes:  Recent Labs Lab 01/18/14 2034 01/19/14 0220  TROPONINI <0.30 <0.30   BNP (last 3 results)  Recent Labs  11/16/13 2349 12/09/13 1811 01/18/14 2034  PROBNP 53.5 24.6 802.5*   CBG:  Recent Labs Lab 01/22/14 2008 01/22/14 2332 01/23/14 0326 01/23/14 0812 01/23/14 1115  GLUCAP 322* 241* 207* 216* 206*    Recent Results (from the past 240 hour(s))  URINE CULTURE     Status: None   Collection Time    01/19/14 12:10 AM      Result Value Ref Range Status   Specimen Description URINE, CLEAN CATCH   Final   Special Requests ADDED [phone]   Final   Culture  Setup Time     Final   Value: 01/19/2014 01:08     Performed at [place]     Final   Value: 7,000 COLONIES/ML     Performed at [facility]   Culture     Final   Value: INSIGNIFICANT GROWTH     Performed at [facility]   Report Status 01/19/2014 FINAL   Final  CULTURE, BLOOD (ROUTINE X 2)     Status: None   Collection Time    01/19/14  1:15 AM      Result Value Ref Range Status   Specimen Description BLOOD LEFT ARM   Final   Special Requests BOTTLES DRAWN AEROBIC ONLY 3CC    Final   Culture  Setup Time     Final   Value: 01/19/2014 09:43  Performed at [facility]     Final   Value:        BLOOD CULTURE RECEIVED NO GROWTH TO DATE CULTURE WILL BE HELD FOR 5 DAYS BEFORE ISSUING A FINAL NEGATIVE REPORT     Performed at [facility]   Report Status PENDING   Incomplete  RESPIRATORY VIRUS PANEL     Status: None   Collection Time    01/19/14  2:13 AM      Result Value Ref Range Status   Source - RVPAN NASAL SWAB   Corrected   Comment: CORRECTED ON 04/27 AT 2141: PREVIOUSLY REPORTED AS NASAL SWAB   Respiratory Syncytial Virus A NOT DETECTED   Final   Respiratory Syncytial Virus B NOT DETECTED   Final   Influenza A NOT DETECTED   Final   Influenza B NOT DETECTED   Final   Parainfluenza 1 NOT DETECTED   Final   Parainfluenza 2 NOT DETECTED   Final   Parainfluenza 3 NOT DETECTED   Final   Metapneumovirus NOT DETECTED   Final   Rhinovirus NOT DETECTED   Final   Adenovirus NOT DETECTED   Final   Influenza A H1 NOT DETECTED   Final   Influenza A H3 NOT DETECTED   Final   Comment: (NOTE)           Normal Reference Range for each Analyte: NOT DETECTED     Testing performed using the Luminex xTAG Respiratory Viral Panel test     kit.     This test was developed and its performance characteristics determined     by [facility]. It has not been cleared or approved by the [place]     Food and Drug Administration. This test is used for clinical purposes.     It should not be regarded as investigational or for research. This     laboratory is certified under the [place] (CLIA) as qualified to perform high complexity     clinical laboratory testing.     Performed at [place] PCR SCREENING     Status: None   Collection Time    01/19/14  2:14 AM      Result Value Ref Range Status   MRSA by PCR NEGATIVE  NEGATIVE Final   Comment:            The GeneXpert MRSA Assay (FDA      approved for NASAL specimens     only), is one component of a     comprehensive MRSA colonization     surveillance program. It is not     intended to diagnose MRSA     infection nor to guide or     monitor treatment for     MRSA infections.  CULTURE, BLOOD (ROUTINE X 2)     Status: None   Collection Time    01/19/14  2:20 AM      Result Value Ref Range Status   Specimen Description BLOOD LEFT WRIST   Final   Special Requests BOTTLES DRAWN AEROBIC ONLY 4CC   Final   Culture  Setup Time     Final   Value: 01/19/2014 09:48     Performed at [facility]   Culture     Final   Value:        BLOOD CULTURE RECEIVED NO GROWTH TO DATE CULTURE WILL  BE HELD FOR 5 DAYS BEFORE ISSUING A FINAL NEGATIVE REPORT     Performed at [facility]   Report Status PENDING   Incomplete     Studies: No results found.  Scheduled Meds: . ALPRAZolam  1 mg Oral TID  . aspirin EC  81 mg Oral Daily  . budesonide (PULMICORT) nebulizer solution  0.5 mg Nebulization BID  . cefTRIAXone (ROCEPHIN)  IV  1 g Intravenous Q24H  . enoxaparin (LOVENOX) injection  60 mg Subcutaneous Q24H  . glimepiride  1 mg Oral QAC breakfast  . guaiFENesin  600 mg Oral BID  . insulin aspart  0-20 Units Subcutaneous 6 times per day  . insulin aspart  20 Units Subcutaneous TID WC  . insulin glargine  50 Units Subcutaneous BID  . ipratropium-albuterol  3 mL Nebulization Q4H  . Liraglutide  18 mg Subcutaneous Daily  . loratadine  10 mg Oral Daily  . nicotine  14 mg Transdermal Daily  . pantoprazole  40 mg Oral Daily  . potassium chloride SA  40 mEq Oral BID  . [START ON 01/24/2014] predniSONE  40 mg Oral Q breakfast  . sertraline  100 mg Oral Daily  . sodium chloride  10-40 mL Intracatheter Q12H  . spironolactone  25 mg Oral BID  . torsemide  100 mg Oral BID   Continuous Infusions: . sodium chloride 10 mL/hr at 01/22/14 0754  . insulin (NOVOLIN-R) infusion 3.3 Units/hr (01/22/14 0452)   Time spent: 25  minutes  Delfina Redwood, MD  [facility] Pager [phone] 7PM-7AM, please contact night-coverage at [email or web address], password [email or web address] 01/23/2014, 2:28 PM  LOS: 5 days

## 2014-01-23 NOTE — Progress Notes (Signed)
PULMONARY / CRITICAL CARE MEDICINE   Name: [name] MRN: [id number] DOB: Jan 30, 1966    ADMISSION DATE:  01/18/2014 CONSULTATION DATE:  01/19/14  REFERRING MD :  Dr. Roel Cluck PRIMARY SERVICE: Hospitalist  CHIEF COMPLAINT:  dyspnea  BRIEF PATIENT DESCRIPTION: Morbidly obese patient with known COPD + OSA on home CPAP and oxygen, diastolic HF, possible pulm HTN, coming in with progressive dyspnea likely COPD exac due to viral illness. Active smoker. 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  SIGNIFICANT EVENTS / STUDIES:  4/27 - BiPAP initiated Feb 09, 2023 - to floor 4/29 - refusing BiPAP  LINES / TUBES: PIV  CULTURES: BCx2 4/27>>> RVP 4/27>>>neg UC 4/27>>>neg  ANTIBIOTICS: Vancomycin 4/26 Zosyn 4/26 Azithromycin 4/27>>> Rocephin 4/27>>>  SUBJECTIVE: Anxious overnight.  Initially refused CPAP but later agreed.  Was concerned about having panic attack.  RN noted cookie, burger, and ice cream wrappers on table and in trash can.  Pt stated that they belonged to sister; however, her CBG was 584.   Otherwise, vitals stable.  VITAL SIGNS: Temp:  [97.6 F (36.4 C)-98.7 F (37.1 C)] 97.6 F (36.4 C) (05/01 0328) Pulse Rate:  [80-83] 83 (05/01 0328) Resp:  [18-20] 20 (05/01 0328) BP: (116-138)/(49-76) 118/61 mmHg (05/01 0328) SpO2:  [94 %-100 %] 98 % (05/01 0829) Weight:  [302 lb 11.1 oz (137.3 kg)] 302 lb 11.1 oz (137.3 kg) (05/01 0328)  INTAKE / OUTPUT: Intake/Output     04/30 0701 - 05/01 0700 05/01 0701 - 05/02 0700   P.O. 360    I.V. (mL/kg)     Total Intake(mL/kg) 360 (2.6)    Urine (mL/kg/hr) 7650 (2.3)    Total Output 7650     Net -7290           PHYSICAL EXAMINATION: General:  Asleep but easily arouseable.  Anxious. Neuro:  Sleepy so unable to assess neuro status.  No obvious focal deficits. HEENT:  Atraumatic, trachea midline, thick neck, no enlarged neck LN Cardiovascular:  RRR Lungs:  Decreased breath sounds bilaterally with prolonged expiration,  moderate expiratory wheeze. Abdomen:  Soft, nontender, no guarding Musculoskeletal:  No deformity, no calf tenderness, trace edema Skin:  No acute rash, no sacral wound  LABS:  CBC  Recent Labs Lab 08-Feb-2014 0500 01/21/14 0500 01/22/14 0324  WBC 12.3* 12.3* 11.7*  HGB 12.4 12.2 12.5  HCT 37.4 37.5 39.4  PLT 167 170 183   BMET  Recent Labs Lab 01/22/14 0148 01/22/14 0600 01/23/14 0550  NA 129* 134* 138  K 4.6 4.8 4.5  CL 82* 87* 88*  CO2 32 33* 37*  BUN 31* 32* 33*  CREATININE 1.20* 1.09 1.03  GLUCOSE 430* 271* 202*   Liver Enzymes  Recent Labs Lab 01/18/14 2034 01/19/14 0220  AST 27 28  ALT 39* 41*  ALKPHOS 122* 115  BILITOT 0.4 0.6  ALBUMIN 3.4* 3.6   Glucose  Recent Labs Lab 01/22/14 1450 01/22/14 1613 01/22/14 2008 01/22/14 2332 01/23/14 0326 01/23/14 0812  GLUCAP 353* 329* 322* 241* 207* 216*    Imaging No results found.  ASSESSMENT / PLAN:  PULMONARY A:  Acute COPD exacerbation - in an active smoker, likely triggered by viral illness, requiring BiPAP.  Less likely bacterial PNA at this point. 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 - Scheduled duoneb (changed from q6 to q4) + pulmicort. - Recommend transition to prednisone  today. - Continue rocephin 5/8 and d/c azithro 5/5. If patient develops high fever or green/yellow sputum, will consider broadening abx coverage. - Resp viral panel negative and all other cultures negative. - Strongly advised to attempt to lose weight and stop smoking, nicotine patch. - Blood sugar control. - Recommend diureses as BP and renal function allows. - Mobilize as able. - Pulmonary hygiene. - PCCM will see again on Monday if patient is still in the hospital.  Rush Farmer, M.D. [facility] Pulmonary/Critical Care Medicine. Pager: [phone]. After hours pager: [phone].

## 2014-01-24 LAB — GLUCOSE, CAPILLARY
GLUCOSE-CAPILLARY: 178 mg/dL — AB (ref 70–99)
GLUCOSE-CAPILLARY: 201 mg/dL — AB (ref 70–99)
GLUCOSE-CAPILLARY: 372 mg/dL — AB (ref 70–99)
Glucose-Capillary: 109 mg/dL — ABNORMAL HIGH (ref 70–99)
Glucose-Capillary: 186 mg/dL — ABNORMAL HIGH (ref 70–99)
Glucose-Capillary: 229 mg/dL — ABNORMAL HIGH (ref 70–99)

## 2014-01-24 MED ORDER — ALPRAZOLAM 0.5 MG PO TABS
1.0000 mg | ORAL_TABLET | Freq: Three times a day (TID) | ORAL | Status: DC | PRN
  Administered 2014-01-24: 1 mg via ORAL
  Filled 2014-01-24: qty 2

## 2014-01-24 MED ORDER — INSULIN ASPART 100 UNIT/ML ~~LOC~~ SOLN
15.0000 [IU] | Freq: Three times a day (TID) | SUBCUTANEOUS | Status: DC
  Administered 2014-01-24 – 2014-01-25 (×3): 15 [IU] via SUBCUTANEOUS

## 2014-01-24 MED ORDER — OXYCODONE HCL 5 MG PO TABS
10.0000 mg | ORAL_TABLET | Freq: Four times a day (QID) | ORAL | Status: DC | PRN
  Administered 2014-01-24 – 2014-01-25 (×3): 10 mg via ORAL
  Filled 2014-01-24 (×3): qty 2

## 2014-01-24 MED ORDER — CEFUROXIME AXETIL 500 MG PO TABS
500.0000 mg | ORAL_TABLET | Freq: Two times a day (BID) | ORAL | Status: DC
  Administered 2014-01-24 – 2014-01-25 (×2): 500 mg via ORAL
  Filled 2014-01-24 (×5): qty 1

## 2014-01-24 NOTE — Progress Notes (Signed)
Overnight, back on IV insulin for severe hyperglycemia. [place] ok. [employer] and Ice cream wrappers found in room  [facility] PROGRESS NOTE  [name] LKG:[id number] DOB: 1966-03-24 DOA: 01/18/2014 PCP: Gwendolyn Grant, MD  Interim Summary Patient is a pleasant 48 year old female with a past medical history of chronic obstructive pulmonary disease, ongoing tobacco abuse, type 2 diabetes mellitus admitted to the medicine service on 01/18/2014. She presented with complaints of shortness of breath, cough, wheezing, found to be in acute hypoxemic respiratory failure. Initial chest x-ray revealed findings that could be suspicious for pneumonia. It was felt that she had a COPD exacerbation precipitated by underlying infectious process, bacterial or viral. Patient was started on empiric IV antibiotic therapy, IV steroids, scheduled nebulizers, admitted to the step down unit. She had a CT scan of lungs performed on 01/19/2014 which did not show evidence of pulmonary embolism. The study did reveal patchy areas of groundglass opacity likely secondary to an infectious or inflammatory process. By 01/20/2014 she reported doing much better, transfer orders written for telemetry. Due to to difficult IV access a PICC line was placed on 01/19/2014.                                                                                                                                                     Assessment/Plan: 1. Acute hypoxemic respiratory failure -Patient presented in respiratory distress, breathing 30 breaths per minute requiring BiPAP. -Likely secondary to COPD exacerbation precipitated from underlying infectious process. -CT scan of lungs do not show evidence of pulmonary embolism. Patchy areas of groundglass opacity noted which likely reflects infectious process. -Will change IV antibiotics to oral ceftin  2. Chronic obstructive pulmonary disease exacerbation See ablve  3. Community  acquire pneumonia versus viral pneumonia -Influenza negative -Change to PO ceftin -Blood cultures remain negative  4. Insulin-dependent diabetes mellitus, uncontrolled Difficult to controlled, required repeat institution of insulin drip. We'll try again to transition to subcutaneous. Increase Lantus and NovoLog. Encouraged compliance with diet.  5. Obstructive sleep apnea -NPPV each bedtime  7. Chronic diastolic congestive heart failure -Appears compensated clinically -Continue torsemide 100 mg by mouth twice a day and spironolactone 25 mg by mouth daily  Change to po steroids  increase activity. Home in am if stable  Code Status: Full code Family Communication: Disposition Plan:    Consultants:  Pulmonary critical care medicine  Procedures:  PICC line placement on 01/19/2014  Antibiotics:  Ceftriaxone 1 g IV every 24 hours  Azithromycin 500 mg IV every 24 hours  HPI/Subjective: Feels a little better, ambulated last night, sats dropped to 86%  Objective: Filed Vitals:   01/24/14 0427  BP: 117/72  Pulse: 80  Temp: 98.2 F (36.8 C)  Resp: 18    Intake/Output Summary (Last 24 hours) at 01/24/14 0848 Last data filed at 01/24/14 0730  Gross per 24 hour  Intake   1200 ml  Output   6101 ml  Net  -4901 ml   Filed Weights   01/21/14 0333 01/23/14 0328 01/24/14 0500  Weight: 138.392 kg (305 lb 1.6 oz) 137.3 kg (302 lb 11.1 oz) 135.9 kg (299 lb 9.7 oz)    Exam:   General:  Eating crackers and peanut butter. Breathing nonlabored.  Cardiovascular: Tachycardic, regular rate rhythm normal S1-S2  Respiratory: Diminished breath sounds bilaterally, no bruits. Good air movement  Abdomen: Soft nontender nondistended  Extremities: No edema  Data Reviewed: Basic Metabolic Panel:  Recent Labs Lab 01/20/14 0500 01/21/14 0500 01/22/14 0148 01/22/14 0600 01/23/14 0550  NA 133* 138 129* 134* 138  K 5.2 4.5 4.6 4.8 4.5  CL 91* 94* 82* 87* 88*  CO2 28 30 32  33* 37*  GLUCOSE 359* 278* 430* 271* 202*  BUN 21 27* 31* 32* 33*  CREATININE 1.03 1.04 1.20* 1.09 1.03  CALCIUM 8.9 8.9 10.2 9.3 9.5  MG  --   --  2.2  --   --   PHOS  --   --  4.7*  --   --    Liver Function Tests:  Recent Labs Lab 01/18/14 2034 01/19/14 0220  AST 27 28  ALT 39* 41*  ALKPHOS 122* 115  BILITOT 0.4 0.6  PROT 7.4 7.6  ALBUMIN 3.4* 3.6   No results found for this basename: LIPASE, AMYLASE,  in the last 168 hours No results found for this basename: AMMONIA,  in the last 168 hours CBC:  Recent Labs Lab 01/18/14 2034 01/19/14 0220 01/20/14 0500 01/21/14 0500 01/22/14 0324  WBC 8.1 8.4 12.3* 12.3* 11.7*  NEUTROABS 6.0 7.5  --   --   --   HGB 13.1 13.5 12.4 12.2 12.5  HCT 39.1 39.9 37.4 37.5 39.4  MCV 77.0* 77.8* 78.1 78.8 79.6  PLT 158 157 167 170 183   Cardiac Enzymes:  Recent Labs Lab 01/18/14 2034 01/19/14 0220  TROPONINI <0.30 <0.30   BNP (last 3 results)  Recent Labs  11/16/13 2349 12/09/13 1811 01/18/14 2034  PROBNP 53.5 24.6 802.5*   CBG:  Recent Labs Lab 01/23/14 1616 01/23/14 2014 01/23/14 2354 01/24/14 0422 01/24/14 0758  GLUCAP 165* 188* 229* 186* 109*    Recent Results (from the past 240 hour(s))  URINE CULTURE     Status: None   Collection Time    01/19/14 12:10 AM      Result Value Ref Range Status   Specimen Description URINE, CLEAN CATCH   Final   Special Requests ADDED [phone]   Final   Culture  Setup Time     Final   Value: 01/19/2014 01:08     Performed at [place]     Final   Value: 7,000 COLONIES/ML     Performed at [facility]   Culture     Final   Value: INSIGNIFICANT GROWTH     Performed at [facility]   Report Status 01/19/2014 FINAL   Final  CULTURE, BLOOD (ROUTINE X 2)     Status: None   Collection Time    01/19/14  1:15 AM      Result Value Ref Range Status   Specimen Description BLOOD LEFT ARM   Final   Special Requests BOTTLES DRAWN AEROBIC ONLY  3CC   Final   Culture  Setup Time     Final   Value:  01/19/2014 09:43     Performed at [facility]   Culture     Final   Value:        BLOOD CULTURE RECEIVED NO GROWTH TO DATE CULTURE WILL BE HELD FOR 5 DAYS BEFORE ISSUING A FINAL NEGATIVE REPORT     Performed at [facility]   Report Status PENDING   Incomplete  RESPIRATORY VIRUS PANEL     Status: None   Collection Time    01/19/14  2:13 AM      Result Value Ref Range Status   Source - RVPAN NASAL SWAB   Corrected   Comment: CORRECTED ON 04/27 AT 2141: PREVIOUSLY REPORTED AS NASAL SWAB   Respiratory Syncytial Virus A NOT DETECTED   Final   Respiratory Syncytial Virus B NOT DETECTED   Final   Influenza A NOT DETECTED   Final   Influenza B NOT DETECTED   Final   Parainfluenza 1 NOT DETECTED   Final   Parainfluenza 2 NOT DETECTED   Final   Parainfluenza 3 NOT DETECTED   Final   Metapneumovirus NOT DETECTED   Final   Rhinovirus NOT DETECTED   Final   Adenovirus NOT DETECTED   Final   Influenza A H1 NOT DETECTED   Final   Influenza A H3 NOT DETECTED   Final   Comment: (NOTE)           Normal Reference Range for each Analyte: NOT DETECTED     Testing performed using the Luminex xTAG Respiratory Viral Panel test     kit.     This test was developed and its performance characteristics determined     by [facility]. It has not been cleared or approved by the [place]     Food and Drug Administration. This test is used for clinical purposes.     It should not be regarded as investigational or for research. This     laboratory is certified under the [place] (CLIA) as qualified to perform high complexity     clinical laboratory testing.     Performed at [facility] PCR SCREENING     Status: None   Collection Time    01/19/14  2:14 AM      Result Value Ref Range Status   MRSA by PCR NEGATIVE  NEGATIVE Final   Comment:            The GeneXpert MRSA Assay  (FDA     approved for NASAL specimens     only), is one component of a     comprehensive MRSA colonization     surveillance program. It is not     intended to diagnose MRSA     infection nor to guide or     monitor treatment for     MRSA infections.  CULTURE, BLOOD (ROUTINE X 2)     Status: None   Collection Time    01/19/14  2:20 AM      Result Value Ref Range Status   Specimen Description BLOOD LEFT WRIST   Final   Special Requests BOTTLES DRAWN AEROBIC ONLY 4CC   Final   Culture  Setup Time     Final   Value: 01/19/2014 09:48     Performed at [facility]   Culture     Final   Value:        BLOOD CULTURE RECEIVED  NO GROWTH TO DATE CULTURE WILL BE HELD FOR 5 DAYS BEFORE ISSUING A FINAL NEGATIVE REPORT     Performed at [facility]   Report Status PENDING   Incomplete     Studies: No results found.  Scheduled Meds: . aspirin EC  81 mg Oral Daily  . budesonide (PULMICORT) nebulizer solution  0.5 mg Nebulization BID  . cefUROXime  500 mg Oral BID WC  . enoxaparin (LOVENOX) injection  60 mg Subcutaneous Q24H  . glimepiride  1 mg Oral QAC breakfast  . guaiFENesin  600 mg Oral BID  . insulin aspart  0-20 Units Subcutaneous 6 times per day  . insulin aspart  15 Units Subcutaneous TID WC  . insulin glargine  50 Units Subcutaneous BID  . ipratropium-albuterol  3 mL Nebulization Q4H  . Liraglutide  0.6 mg Subcutaneous Daily  . loratadine  10 mg Oral Daily  . nicotine  14 mg Transdermal Daily  . pantoprazole  40 mg Oral Daily  . potassium chloride SA  40 mEq Oral BID  . predniSONE  40 mg Oral Q breakfast  . sertraline  100 mg Oral Daily  . sodium chloride  10-40 mL Intracatheter Q12H  . spironolactone  25 mg Oral BID  . torsemide  100 mg Oral BID   Continuous Infusions: . insulin (NOVOLIN-R) infusion 3.3 Units/hr (01/22/14 0452)   Time spent: 25 minutes  Kelvin Cellar, MD  [facility] Pager [phone] 7PM-7AM, please contact night-coverage at  [email or web address], password [facility] 01/24/2014, 8:48 AM  LOS: 6 days

## 2014-01-24 NOTE — Progress Notes (Signed)
Placed patient on CPAP for the night via auto-mode with minimum pressure set at 5cm and maximum pressure set at 20cm. Oxygen set at 3lpm  

## 2014-01-25 ENCOUNTER — Other Ambulatory Visit: Payer: Self-pay | Admitting: Internal Medicine

## 2014-01-25 LAB — CREATININE, SERUM
Creatinine, Ser: 0.91 mg/dL (ref 0.50–1.10)
GFR calc Af Amer: 86 mL/min — ABNORMAL LOW (ref >90)
GFR, EST NON AFRICAN AMERICAN: 75 mL/min — AB (ref >90)

## 2014-01-25 LAB — CBC
HEMATOCRIT: 40.9 % (ref 36.0–46.0)
HEMOGLOBIN: 13.3 g/dL (ref 12.0–15.0)
MCH: 26.2 pg (ref 26.0–34.0)
MCHC: 32.5 g/dL (ref 30.0–36.0)
MCV: 80.7 fL (ref 78.0–100.0)
Platelets: 178 10*3/uL (ref 150–400)
RBC: 5.07 MIL/uL (ref 3.87–5.11)
RDW: 20.6 % — ABNORMAL HIGH (ref 11.5–15.5)
WBC: 15.7 10*3/uL — ABNORMAL HIGH (ref 4.0–10.5)

## 2014-01-25 LAB — GLUCOSE, CAPILLARY
GLUCOSE-CAPILLARY: 95 mg/dL (ref 70–99)
Glucose-Capillary: 117 mg/dL — ABNORMAL HIGH (ref 70–99)
Glucose-Capillary: 121 mg/dL — ABNORMAL HIGH (ref 70–99)
Glucose-Capillary: 170 mg/dL — ABNORMAL HIGH (ref 70–99)

## 2014-01-25 MED ORDER — PREDNISONE 10 MG PO TABS: ORAL_TABLET | ORAL | Status: DC

## 2014-01-25 MED ORDER — INSULIN GLARGINE 100 UNIT/ML ~~LOC~~ SOLN: 30.0000 [IU] | Freq: Two times a day (BID) | SUBCUTANEOUS | Status: DC

## 2014-01-25 MED ORDER — INSULIN ASPART 100 UNIT/ML ~~LOC~~ SOLN: 10.0000 [IU] | Freq: Three times a day (TID) | SUBCUTANEOUS | Status: DC

## 2014-01-25 MED ORDER — GUAIFENESIN ER 600 MG PO TB12: 600.0000 mg | ORAL_TABLET | Freq: Two times a day (BID) | ORAL | Status: DC

## 2014-01-25 MED ORDER — HYDROCOD POLST-CHLORPHEN POLST 10-8 MG/5ML PO LQCR: 5.0000 mL | Freq: Two times a day (BID) | ORAL | Status: DC | PRN

## 2014-01-25 MED ORDER — ALPRAZOLAM 1 MG PO TABS: 1.0000 mg | ORAL_TABLET | Freq: Three times a day (TID) | ORAL | Status: DC | PRN

## 2014-01-25 MED ORDER — CEFUROXIME AXETIL 500 MG PO TABS: 500.0000 mg | ORAL_TABLET | Freq: Two times a day (BID) | ORAL | Status: DC

## 2014-01-25 NOTE — Progress Notes (Signed)
Pt discharged per MD order and protocol. Discharge instructions reviewed with patient and all questions answered. Pt aware of follow up appointments and given all prescriptions. Pt given information on smoking cessation.

## 2014-01-25 NOTE — Progress Notes (Signed)
Patient refused Liraglutide S.Q. States " IT CAUSES ABDO CRAMPING" SHE SPIKE WITH A PHARM. WHO TOLD HER THAT IS ONE OF THE SIDE EFFECTS.

## 2014-01-25 NOTE — Care Management Note (Signed)
    Page 1 of 1   01/25/2014     1:48:53 PM CARE MANAGEMENT NOTE 01/25/2014  Patient:  [name], [name]   Account Number:  [id number]  Date Initiated:  01/19/2014  Documentation initiated by:  Marvetta Gibbons  Subjective/Objective Assessment:   Pt admitted with asthma exacerbation with COPD     Action/Plan:   PTA pt lived at home, has home 02   Anticipated DC Date:  01/22/2014   Anticipated DC Plan:  [place]  CM consult      Choice offered to / List presented to:             Status of service:  Completed, signed off Medicare Important Message given?  YES (If response is "NO", the following Medicare IM given date fields will be blank) Date Medicare IM given:  01/21/2014 Date Additional Medicare IM given:    Discharge Disposition:  HOME/SELF CARE  Per UR Regulation:  Reviewed for med. necessity/level of care/duration of stay  If discussed at [place] of Stay Meetings, dates discussed:    Comments:  01/25/14 08:30 CM was called by RN as pt states her insurance does not cover the [place] ordered by the MD.  CM gave pt a Script Relief card for a possible 75% off cost.  No other needs were communicated.  [name], BSN, [place].

## 2014-01-25 NOTE — Discharge Summary (Signed)
Physician Discharge Summary  [name] ZYS:[id number] DOB: 25-Jul-1966 DOA: 01/18/2014  PCP: Gwendolyn Grant, MD  Admit date: 01/18/2014 Discharge date: 01/25/2014  Time spent: *greater than   Recommendations for Outpatient Follow-up:  Optimize diabetes control Repeat TSH once acute illness resolves  Discharge Diagnoses:  Primary Acute on chronic respiratory failure Active Problems:   HYPERTENSION   OBSTRUCTIVE SLEEP APNEA   Diabetes type 2, uncontrolled   Tobacco abuse, counselled against   Chronic diastolic CHF (congestive heart failure)   Hypokalemia   COPD exacerbation pneumonia   Abnormal TSH   Discharge Condition: stable  Filed Weights   01/23/14 0328 01/24/14 0500 01/25/14 0400  Weight: 137.3 kg (302 lb 11.1 oz) 135.9 kg (299 lb 9.7 oz) 134.7 kg (296 lb 15.4 oz)    History of present illness/Hospital Course:  Patient is a pleasant 48 year old female with a past medical history of chronic obstructive pulmonary disease, ongoing tobacco abuse, type 2 diabetes mellitus admitted to the medicine service on 01/18/2014. She presented with complaints of shortness of breath, cough, wheezing, found to be in acute hypoxemic respiratory failure. Initial chest x-ray revealed findings that could be suspicious for pneumonia. It was felt that she had a COPD exacerbation precipitated by underlying infectious process, bacterial or viral. Patient was started on empiric IV antibiotic therapy, IV steroids, scheduled nebulizers, admitted to the step down unit, required BiPAP. PCCM consulted.  She had a CT scan of lungs performed on 01/19/2014 which did not show evidence of pulmonary embolism. The study did reveal patchy areas of groundglass opacity likely secondary to an infectious or inflammatory process. Due to to difficult IV access a PICC line was placed on 01/19/2014. Slow imrpovement in dyspnea. Blood glucoses proved difficult to control, and patient required intermittent IV  insulin due to severe hyperglycemia Assessment/Plan: Acute on chronic hypoxemic respiratory failure -Patient presented in respiratory distress, breathing 30 breaths per minute requiring BiPAP.  -Likely secondary to COPD exacerbation precipitated from underlying infectious process.  -CT scan of lungs do not show evidence of pulmonary embolism. Patchy areas of groundglass opacity noted which likely reflects infectious process.  Taper steroids. Continue nebs, abx. By discharge, able to ambulate to nursing station and back on oxygen. Chronic home oxygen Community acquire pneumonia versus viral pneumonia  -Influenza negative  Treated with ceftriaxone, azithromycin -Blood cultures negative   Insulin-dependent diabetes mellitus, uncontrolled  Difficult to control, required repeat institution of insulin drip. We'll try again to transition to subcutaneous. Increase Lantus and NovoLog. Encouraged compliance with diet.   Obstructive sleep apnea  -NPPV each bedtime  Chronic diastolic congestive heart failure  -Appears compensated clinically  -Continue torsemide 100 mg by mouth twice a day and spironolactone 25 mg by mouth daily  Low TSH 0.083 with normal FT4 and T3. Likely euthyroid sick syndrome. Needs repeat  Code Status: Full code  Family Communication:  Disposition Plan:  Consultants:  Pulmonary critical care medicine Procedures:  PICC line placement on 01/19/2014 nippv Antibiotics:  Ceftriaxone 1 g IV every 24 hours  Azithromycin 500 mg IV every 24 hours  Discharge Exam: Filed Vitals:   01/25/14 0400  BP: 132/89  Pulse: 76  Temp: 97.6 F (36.4 C)  Resp: 18    General: comfortable Cardiovascular: RRR Respiratory: CTA Ext edema  Discharge Instructions You were cared for by a hospitalist during your hospital stay. If you have any questions about your discharge medications or the care you received while you were in the hospital after you are discharged, you  can call the unit and  asked to speak with the hospitalist on call if the hospitalist that took care of you is not available. Once you are discharged, your primary care physician will handle any further medical issues. Please note that NO REFILLS for any discharge medications will be authorized once you are discharged, as it is imperative that you return to your primary care physician (or establish a relationship with a primary care physician if you do not have one) for your aftercare needs so that they can reassess your need for medications and monitor your lab values.  Discharge Orders   Future Appointments Provider Department Dept Phone   02/09/2014 2:15 PM Philemon Kingdom, MD [facility] [phone]   03/12/2014 12:00 PM Kathee Delton, MD [facility] [phone]   Future Orders Complete By Expires   Diet - low sodium heart healthy  As directed    Diet Carb Modified  As directed    Discharge instructions  As directed    Increase activity slowly  As directed        Medication List         albuterol (2.5 MG/3ML) 0.083% nebulizer solution  Commonly known as:  PROVENTIL  Take 2.5 mg by nebulization every 6 (six) hours as needed for wheezing.     albuterol 108 (90 BASE) MCG/ACT inhaler  Commonly known as:  PROVENTIL HFA;VENTOLIN HFA  Inhale 2 puffs into the lungs every 6 (six) hours as needed for wheezing or shortness of breath.     ALPRAZolam 1 MG tablet  Commonly known as:  XANAX  Take 1 tablet (1 mg total) by mouth 3 (three) times daily as needed for anxiety.     aspirin EC 81 MG tablet  Take 1 tablet (81 mg total) by mouth daily.     budesonide-formoterol 160-4.5 MCG/ACT inhaler  Commonly known as:  SYMBICORT  Inhale 2 puffs into the lungs 2 (two) times daily.     cefUROXime 500 MG tablet  Commonly known as:  CEFTIN  Take 1 tablet (500 mg total) by mouth 2 (two) times daily with a meal.     cetirizine 1 MG/ML syrup  Commonly known as:  ZYRTEC  Take 10 mLs (10  mg total) by mouth daily.     chlorpheniramine-HYDROcodone 10-8 MG/5ML Lqcr  Commonly known as:  TUSSIONEX  Take 5 mLs by mouth every 12 (twelve) hours as needed for cough.     esomeprazole 40 MG capsule  Commonly known as:  NEXIUM  Take 1 capsule (40 mg total) by mouth 2 (two) times daily.     fluticasone 50 MCG/ACT nasal spray  Commonly known as:  FLONASE  Place 2 sprays into both nostrils daily as needed for rhinitis.     glimepiride 1 MG tablet  Commonly known as:  AMARYL  Take 1 mg by mouth daily before breakfast.     glucose blood test strip  Commonly known as:  ONE TOUCH ULTRA TEST  - Use to check blood sugars twice a day  - Dx 250.00     guaiFENesin 600 MG 12 hr tablet  Commonly known as:  MUCINEX  Take 1 tablet (600 mg total) by mouth 2 (two) times daily.     hydrOXYzine 10 MG tablet  Commonly known as:  ATARAX/VISTARIL  Take 1 tablet (10 mg total) by mouth 3 (three) times daily as needed for itching.     insulin aspart 100 UNIT/ML injection  Commonly known as:  novoLOG  - Inject 10 Units into the skin 3 (three) times daily with meals. CBG less than 140: 0 units  - 141-200: 2 units  - 201-250: 4 units  - 251-300: 6 units  - 301-350: 8 units  - 351-400: 10 units  - 400+: call MD     insulin glargine 100 UNIT/ML injection  Commonly known as:  LANTUS  - Inject 0.3 mLs (30 Units total) into the skin 2 (two) times daily. Takes 20 units every evening.  - Also takes 20 units in the morning only if sugar is over 200     ONE TOUCH ULTRA 2 W/DEVICE Kit  - Use as directed   - Dx 629.52     ONETOUCH DELICA LANCETS 84X Misc  - Use to help check blood sugars twice a day  - Dx 250.00     Oxycodone HCl 10 MG Tabs  Take 1 tablet (10 mg total) by mouth 3 (three) times daily as needed (pain). May fill not more than once every 30 days.     potassium chloride SA 20 MEQ tablet  Commonly known as:  K-DUR,KLOR-CON  Take 40 mEq by mouth 2 (two) times daily.      predniSONE 10 MG tablet  Commonly known as:  DELTASONE  4 tablets daily for 2 days, then decrease by 1 tablet every 2 days until off     sertraline 100 MG tablet  Commonly known as:  ZOLOFT  Take 100 mg by mouth daily.     spironolactone 25 MG tablet  Commonly known as:  ALDACTONE  Take 1 tablet (25 mg total) by mouth 2 (two) times daily.     tiotropium 18 MCG inhalation capsule  Commonly known as:  SPIRIVA  Place 18 mcg into inhaler and inhale daily.     tiZANidine 4 MG tablet  Commonly known as:  ZANAFLEX  Take 4 mg by mouth every 8 (eight) hours as needed for muscle spasms.     torsemide 100 MG tablet  Commonly known as:  DEMADEX  Take 100 mg by mouth 2 (two) times daily.     varenicline 0.5 MG X 11 & 1 MG X 42 tablet  Commonly known as:  CHANTIX STARTING MONTH PAK  Take one 0.5 mg tablet by mouth once daily for 3 days, then increase to one 0.5 mg tablet twice daily for 4 days, then increase to one 1 mg tablet twice daily.     VICTOZA 18 MG/3ML Sopn  Generic drug:  Liraglutide  Inject 0.6 mg into the skin daily.       Allergies  Allergen Reactions  . Simvastatin Other (See Comments)    Severe leg pain and burning  . Sulfamethoxazole-Trimethoprim Nausea And Vomiting    Causes Projectile Vomiting  . Zolpidem Tartrate Other (See Comments)    Chest pain  . Azithromycin Other (See Comments)    Resistant to med  . Doxycycline Other (See Comments)    Resistant to med  . Latex Itching and Rash    Also burning sensations  . Metformin And Related Diarrhea    Severe diarrhea  . Metronidazole Nausea And Vomiting  . Metolazone Other (See Comments)    Cramps,pain  . Ciprofloxacin Nausea Only       Follow-up Information   Follow up with Gwendolyn Grant, MD.   Specialty:  Internal Medicine   Contact information:   [address] [place] [address] [phone]  The results of significant diagnostics from this  hospitalization (including imaging, microbiology, ancillary and laboratory) are listed below for reference.    Significant Diagnostic Studies: Dg Chest 2 View  01/18/2014   ONE CLINICAL DATA: History of congestion and nonproductive cough; history of COPD, diabetes, and congestive heart failure  EXAM: CHEST  2 VIEW  COMPARISON:  DG CHEST 2 VIEW dated 12/17/2013  FINDINGS: The lungs are well-expanded. The interstitial markings are increased bilaterally which is not entirely new. However, there are coarse lung markings in the retrocardiac region on the lateral film which are more conspicuous than in the past. There is no pleural effusion. The cardiopericardial silhouette is top-normal in size. The central pulmonary vascularity is prominent but stable. The mediastinum is normal in width. There is no pneumothorax or pneumomediastinum. The observed portions of the bony thorax exhibit no acute abnormalities.  IMPRESSION: There are findings consistent with pulmonary fibrosis. However, the interstitial markings are increased over and above that seen on the previous study and confluent density in the retrocardiac region likely on the left is suspicious for pneumonia. There is no definite evidence of CHF. Followup films following therapy are recommended to assure clearing. Chest CT scanning may be ultimately needed.   Electronically Signed   By: David  [place]   On: 01/18/2014 21:12   Ct Angio Chest Pe W/cm &/or Wo Cm  01/19/2014   CLINICAL DATA:  Shortness of breath  EXAM: CT ANGIOGRAPHY CHEST WITH CONTRAST  TECHNIQUE: Multidetector CT imaging of the chest was performed using the standard protocol during bolus administration of intravenous contrast. Multiplanar CT image reconstructions and MIPs were obtained to evaluate the vascular anatomy.  CONTRAST:  161m OMNIPAQUE IOHEXOL 350 MG/ML SOLN  COMPARISON:  None.  FINDINGS: There is adequate opacification of the central pulmonary arteries with no central pulmonary embolus.  The segmental and subsegmental branches are suboptimally opacified and are degraded by patient respiratory motion making these areas nondiagnostic. The main pulmonary artery, right main pulmonary artery and left main pulmonary arteries are normal in size. The heart size is normal. There is no pericardial effusion.  There are patchy areas of ground-glass opacity throughout bilateral lungs which may be secondary to an infectious or inflammatory etiology versus normal expiration with the areas lacking ground-glass opacity reflecting air trapping versus hypersensitivity pneumonitis. There is no pleural effusion or pneumothorax. There is bibasilar mild atelectasis  There is no mediastinal or axillary lymphadenopathy. There is a mildly enlarged right hilar lymph node measuring 12 mm in short axis which may be reactive given the pulmonary findings.  There is no lytic or blastic osseous lesion.  The visualized portions of the upper abdomen are unremarkable.  Review of the MIP images confirms the above findings.  IMPRESSION: 1. Adequate opacification of the central pulmonary arteries with no central pulmonary embolus. The segmental and subsegmental branches are suboptimally opacified and are degraded by patient respiratory motion making these areas nondiagnostic.  2. Patchy areas of ground-glass opacity throughout bilateral lungs which may be secondary to an infectious or inflammatory etiology versus normal expiration with the areas lacking ground-glass opacity reflecting air trapping versus hypersensitivity pneumonitis.   Electronically Signed   By: HKathreen Devoid  On: 01/19/2014 18:24   Ir [place] Vasc Access Right  01/19/2014   CLINICAL DATA:  Poor peripheral IV access, need for additional central venous access and access to administer IV contrast for CT scan.  EXAM: POWER PICC LINE PLACEMENT WITH ULTRASOUND AND FLUOROSCOPIC GUIDANCE  FLUOROSCOPY  TIME:  12 seconds.  PROCEDURE: The patient was advised of the possible  risks and complications and agreed to undergo the procedure. The patient was then brought to the angiographic suite for the procedure.  The right arm was prepped with chlorhexidine, draped in the usual sterile fashion using maximum barrier technique (cap and mask, sterile gown, sterile gloves, large sterile sheet, hand hygiene and cutaneous antisepsis) and infiltrated locally with 1% Lidocaine.  Ultrasound demonstrated patency of the right brachial vein, and this was documented with an image. Under real-time ultrasound guidance, this vein was accessed with a 21 gauge micropuncture needle and image documentation was performed. A 0.018 wire was introduced in to the vein. Over this, a 5.0 [place] dual lumen power injectable PICC cut to 41 cm was advanced to the lower SVC/right atrial junction. Fluoroscopy during the procedure and fluoro spot radiograph confirms appropriate catheter position. The catheter was flushed and covered with a sterile dressing.  Complications: None  IMPRESSION: Successful right arm power injectable PICC line placement with ultrasound and fluoroscopic guidance. The catheter is ready for use.   Electronically Signed   By: Aletta Edouard M.D.   On: 01/19/2014 17:16   Dg Chest Port 1 View  01/19/2014   CLINICAL DATA:  Right PICC line placement  EXAM: PORTABLE CHEST - 1 VIEW  COMPARISON:  None.  FINDINGS: Right subclavian PICC line catheter tip is at the level of the distal right innominate vein. This could be advanced 12 cm to the SVC RA junction.  Mild cardiomegaly with slight interstitial edema diffusely. Bibasilar atelectasis versus scarring noted. No new focal pneumonia, collapse or consolidation. No large effusion or pneumothorax. Trachea is midline.  IMPRESSION: Right PICC line tip at the right innominate vein level, this could be advanced 12 cm to the SVC RA junction  Mild interstitial edema pattern, compatible with early CHF  Bibasilar atelectasis versus scarring   Electronically Signed    By: Daryll Brod M.D.   On: 01/19/2014 12:02   Ir Fluoro Guide Cv Midline Picc Right  01/19/2014   CLINICAL DATA:  Poor peripheral IV access, need for additional central venous access and access to administer IV contrast for CT scan.  EXAM: POWER PICC LINE PLACEMENT WITH ULTRASOUND AND FLUOROSCOPIC GUIDANCE  FLUOROSCOPY TIME:  12 seconds.  PROCEDURE: The patient was advised of the possible risks and complications and agreed to undergo the procedure. The patient was then brought to the angiographic suite for the procedure.  The right arm was prepped with chlorhexidine, draped in the usual sterile fashion using maximum barrier technique (cap and mask, sterile gown, sterile gloves, large sterile sheet, hand hygiene and cutaneous antisepsis) and infiltrated locally with 1% Lidocaine.  Ultrasound demonstrated patency of the right brachial vein, and this was documented with an image. Under real-time ultrasound guidance, this vein was accessed with a 21 gauge micropuncture needle and image documentation was performed. A 0.018 wire was introduced in to the vein. Over this, a 5.0 [place] dual lumen power injectable PICC cut to 41 cm was advanced to the lower SVC/right atrial junction. Fluoroscopy during the procedure and fluoro spot radiograph confirms appropriate catheter position. The catheter was flushed and covered with a sterile dressing.  Complications: None  IMPRESSION: Successful right arm power injectable PICC line placement with ultrasound and fluoroscopic guidance. The catheter is ready for use.   Electronically Signed   By: Aletta Edouard M.D.   On: 01/19/2014 17:16    Microbiology: Recent Results (from the past  240 hour(s))  URINE CULTURE     Status: None   Collection Time    01/19/14 12:10 AM      Result Value Ref Range Status   Specimen Description URINE, CLEAN CATCH   Final   Special Requests ADDED [phone]   Final   Culture  Setup Time     Final   Value: 01/19/2014 01:08     Performed at  [facility]     Final   Value: 7,000 COLONIES/ML     Performed at [facility]   Culture     Final   Value: INSIGNIFICANT GROWTH     Performed at [facility]   Report Status 01/19/2014 FINAL   Final  CULTURE, BLOOD (ROUTINE X 2)     Status: None   Collection Time    01/19/14  1:15 AM      Result Value Ref Range Status   Specimen Description BLOOD LEFT ARM   Final   Special Requests BOTTLES DRAWN AEROBIC ONLY 3CC   Final   Culture  Setup Time     Final   Value: 01/19/2014 09:43     Performed at [facility]   Culture     Final   Value:        BLOOD CULTURE RECEIVED NO GROWTH TO DATE CULTURE WILL BE HELD FOR 5 DAYS BEFORE ISSUING A FINAL NEGATIVE REPORT     Performed at [facility]   Report Status PENDING   Incomplete  RESPIRATORY VIRUS PANEL     Status: None   Collection Time    01/19/14  2:13 AM      Result Value Ref Range Status   Source - RVPAN NASAL SWAB   Corrected   Comment: CORRECTED ON 04/27 AT 2141: PREVIOUSLY REPORTED AS NASAL SWAB   Respiratory Syncytial Virus A NOT DETECTED   Final   Respiratory Syncytial Virus B NOT DETECTED   Final   Influenza A NOT DETECTED   Final   Influenza B NOT DETECTED   Final   Parainfluenza 1 NOT DETECTED   Final   Parainfluenza 2 NOT DETECTED   Final   Parainfluenza 3 NOT DETECTED   Final   Metapneumovirus NOT DETECTED   Final   Rhinovirus NOT DETECTED   Final   Adenovirus NOT DETECTED   Final   Influenza A H1 NOT DETECTED   Final   Influenza A H3 NOT DETECTED   Final   Comment: (NOTE)           Normal Reference Range for each Analyte: NOT DETECTED     Testing performed using the Luminex xTAG Respiratory Viral Panel test     kit.     This test was developed and its performance characteristics determined     by [facility]. It has not been cleared or approved by the [place]     Food and Drug Administration. This test is used for clinical purposes.     It should not be  regarded as investigational or for research. This     laboratory is certified under the [place] (CLIA) as qualified to perform high complexity     clinical laboratory testing.     Performed at [facility] PCR SCREENING     Status: None   Collection Time    01/19/14  2:14 AM  Result Value Ref Range Status   MRSA by PCR NEGATIVE  NEGATIVE Final   Comment:            The GeneXpert MRSA Assay (FDA     approved for NASAL specimens     only), is one component of a     comprehensive MRSA colonization     surveillance program. It is not     intended to diagnose MRSA     infection nor to guide or     monitor treatment for     MRSA infections.  CULTURE, BLOOD (ROUTINE X 2)     Status: None   Collection Time    01/19/14  2:20 AM      Result Value Ref Range Status   Specimen Description BLOOD LEFT WRIST   Final   Special Requests BOTTLES DRAWN AEROBIC ONLY 4CC   Final   Culture  Setup Time     Final   Value: 01/19/2014 09:48     Performed at [facility]   Culture     Final   Value:        BLOOD CULTURE RECEIVED NO GROWTH TO DATE CULTURE WILL BE HELD FOR 5 DAYS BEFORE ISSUING A FINAL NEGATIVE REPORT     Performed at [facility]   Report Status PENDING   Incomplete     Labs: Basic Metabolic Panel:  Recent Labs Lab 01/20/14 0500 01/21/14 0500 01/22/14 0148 01/22/14 0600 01/23/14 0550 01/25/14 0635  NA 133* 138 129* 134* 138  --   K 5.2 4.5 4.6 4.8 4.5  --   CL 91* 94* 82* 87* 88*  --   CO2 28 30 32 33* 37*  --   GLUCOSE 359* 278* 430* 271* 202*  --   BUN 21 27* 31* 32* 33*  --   CREATININE 1.03 1.04 1.20* 1.09 1.03 0.91  CALCIUM 8.9 8.9 10.2 9.3 9.5  --   MG  --   --  2.2  --   --   --   PHOS  --   --  4.7*  --   --   --    Liver Function Tests:  Recent Labs Lab 01/18/14 2034 01/19/14 0220  AST 27 28  ALT 39* 41*  ALKPHOS 122* 115  BILITOT 0.4 0.6  PROT 7.4 7.6  ALBUMIN 3.4* 3.6    No results found for this basename: LIPASE, AMYLASE,  in the last 168 hours No results found for this basename: AMMONIA,  in the last 168 hours CBC:  Recent Labs Lab 01/18/14 2034 01/19/14 0220 01/20/14 0500 01/21/14 0500 01/22/14 0324 01/25/14 0635  WBC 8.1 8.4 12.3* 12.3* 11.7* 15.7*  NEUTROABS 6.0 7.5  --   --   --   --   HGB 13.1 13.5 12.4 12.2 12.5 13.3  HCT 39.1 39.9 37.4 37.5 39.4 40.9  MCV 77.0* 77.8* 78.1 78.8 79.6 80.7  PLT 158 157 167 170 183 178   Cardiac Enzymes:  Recent Labs Lab 01/18/14 2034 01/19/14 0220  TROPONINI <0.30 <0.30   BNP: BNP (last 3 results)  Recent Labs  11/16/13 2349 12/09/13 1811 01/18/14 2034  PROBNP 53.5 24.6 802.5*   CBG:  Recent Labs Lab 01/24/14 1615 01/24/14 2011 01/25/14 0005 01/25/14 0359 01/25/14 0754  GLUCAP 201* 372* 117* 121* 95       Signed:  Delfina Redwood, MD [facility] 01/25/2014, 9:14 AM

## 2014-01-26 ENCOUNTER — Other Ambulatory Visit: Payer: Self-pay | Admitting: *Deleted

## 2014-01-26 LAB — CULTURE, BLOOD (ROUTINE X 2)
CULTURE: NO GROWTH
Culture: NO GROWTH

## 2014-01-26 MED ORDER — ALBUTEROL SULFATE HFA 108 (90 BASE) MCG/ACT IN AERS: INHALATION_SPRAY | RESPIRATORY_TRACT | Status: DC

## 2014-01-28 ENCOUNTER — Encounter: Payer: Self-pay | Admitting: Internal Medicine

## 2014-01-28 ENCOUNTER — Ambulatory Visit (INDEPENDENT_AMBULATORY_CARE_PROVIDER_SITE_OTHER): Payer: Medicare Other | Admitting: Internal Medicine

## 2014-01-28 VITALS — BP 120/82 | HR 88 | Temp 98.8°F | Wt 298.4 lb

## 2014-01-28 DIAGNOSIS — IMO0001 Reserved for inherently not codable concepts without codable children: Secondary | ICD-10-CM

## 2014-01-28 DIAGNOSIS — I5032 Chronic diastolic (congestive) heart failure: Secondary | ICD-10-CM

## 2014-01-28 DIAGNOSIS — J9620 Acute and chronic respiratory failure, unspecified whether with hypoxia or hypercapnia: Secondary | ICD-10-CM

## 2014-01-28 DIAGNOSIS — R946 Abnormal results of thyroid function studies: Secondary | ICD-10-CM

## 2014-01-28 DIAGNOSIS — I509 Heart failure, unspecified: Secondary | ICD-10-CM

## 2014-01-28 DIAGNOSIS — Z1239 Encounter for other screening for malignant neoplasm of breast: Secondary | ICD-10-CM

## 2014-01-28 DIAGNOSIS — IMO0002 Reserved for concepts with insufficient information to code with codable children: Secondary | ICD-10-CM

## 2014-01-28 DIAGNOSIS — R7989 Other specified abnormal findings of blood chemistry: Secondary | ICD-10-CM

## 2014-01-28 DIAGNOSIS — E1165 Type 2 diabetes mellitus with hyperglycemia: Secondary | ICD-10-CM

## 2014-01-28 MED ORDER — TIZANIDINE HCL 4 MG PO TABS: 4.0000 mg | ORAL_TABLET | Freq: Three times a day (TID) | ORAL | Status: DC | PRN

## 2014-01-28 MED ORDER — OXYCODONE HCL 10 MG PO TABS: 10.0000 mg | ORAL_TABLET | Freq: Three times a day (TID) | ORAL | Status: DC | PRN

## 2014-01-28 NOTE — Progress Notes (Signed)
Pre visit review using our clinic review tool, if applicable. No additional management support is needed unless otherwise documented below in the visit note. 

## 2014-01-28 NOTE — Assessment & Plan Note (Signed)
New dx 11/2011 - exacerbated by frequent steroid use for pulm dz Intol of metformin due to severe diarrhea side effects  On Lantus pen qhs and Amaryl in AM - titrate as needed Added low dose Victoza 10/2013- may also assist with patient's weight loss goals for controlling appetite?  Lab Results  Component Value Date   HGBA1C 8.7* 01/19/2014

## 2014-01-28 NOTE — Assessment & Plan Note (Signed)
Sick euthyroid with normal T3 and T4 during hosp 12/2013 Recheck TSH in 6-8 weeks Lab Results  Component Value Date   TSH 0.086* 01/22/2014

## 2014-01-28 NOTE — Assessment & Plan Note (Signed)
Acute exacerbation with COPD 06/2013, 07/2013 and 12/2013 - hospitalization for same reviewed - resolved to baseline Chronic hypoxic resp failure with COPD - continue bipap at home, on nasal mask since 10/14 hosp, but noncompliance with same reviewed baseline hypercarbia reviewed ongoing chronic inhalers (spiriva and symbicort) with prn nebulizer continue home oxygen and followup with pulmonary specialist as planned Commended pt on cessation of smoking and encouraged never to smoke again!

## 2014-01-28 NOTE — Patient Instructions (Signed)
It was good to see you today.  We have reviewed your hospital records including labs and tests today  Medications reviewed and updated -  No changes recommended   Keep followup with Drs. Idelle Crouch and your other specialists as planned  Congratulations on being done smoking! Do not start smoking again!!!

## 2014-01-28 NOTE — Progress Notes (Signed)
Subjective:    [name], female    DOB: 01-May-1966, 48 y.o.   MRN: [id number]  HPI  Patient is here for hospital follow up  Reviewed chronic medical issues and interval medical events  Past Medical History  Diagnosis Date  . Hypertension   . Hyperlipidemia   . Chronic headache   . Fibromyalgia     daily narcotics  . Anxiety     hx chronic BZ use, stopped 07/2010  . Anemia   . Pulmonary sarcoidosis     unimpressive CT chest 2011  . Colonic polyp   . GERD (gastroesophageal reflux disease)   . ALLERGIC RHINITIS   . Asthma   . CHF (congestive heart failure)     Diastolic with fluid overload, May, 2012, LVEF 60%  . Morbid obesity   . Depression   . Panic attacks   . Diabetes mellitus   . COPD (chronic obstructive pulmonary disease)     on home O2, moderate airflow obstruction, suspect d/t emphysema  . Obesity   . Elevated LFTs 09/2011  . Ovarian cyst   . Chronic back pain   . Sleep apnea      CPAP    Review of Systems  Constitutional: Positive for fatigue. Negative for fever.  Respiratory: Positive for shortness of breath (at baseline, chronic sx). Negative for cough.   Cardiovascular: Negative for chest pain, palpitations and leg swelling.  Psychiatric/Behavioral: Positive for dysphoric mood. Negative for suicidal ideas, hallucinations, behavioral problems, confusion, self-injury and decreased concentration. The patient is nervous/anxious. The patient is not hyperactive.        Objective:   Physical Exam  BP 120/82  Pulse 88  Temp(Src) 98.8 F (37.1 C) (Oral)  Wt 298 lb 6.4 oz (135.353 kg)  SpO2 94%  LMP 10/16/2012 Wt Readings from Last 3 Encounters:  01/28/14 298 lb 6.4 oz (135.353 kg)  01/25/14 296 lb 15.4 oz (134.7 kg)  12/10/13 297 lb 3.2 oz (134.809 kg)   Constitutional: She is obese, chronic dyspnea at rest, wearing O2 -appears well-developed and well-nourished. No acute distress.  Neck: Normal range of motion. Neck  supple. No JVD present. No thyromegaly present.  Cardiovascular: Distant but appears normal rate, regular rhythm and normal heart sounds.  No murmur heard. No BLE edema. Pulmonary/Chest: Effort normal at rest and breath sounds diminished at bilateral bases. No respiratory distress. She has no wheezes or cracles.  Psychiatric: She has a normal mood and affect. Her behavior is normal. Judgment and thought content normal.   Lab Results  Component Value Date   WBC 15.7* 01/25/2014   HGB 13.3 01/25/2014   HCT 40.9 01/25/2014   PLT 178 01/25/2014   GLUCOSE 202* 01/23/2014   CHOL 304* 09/03/2013   TRIG 340.0* 09/03/2013   HDL 49.50 09/03/2013   LDLDIRECT 204.3 09/03/2013   LDLCALC  Value: 92        Total Cholesterol/HDL:CHD Risk Coronary Heart Disease Risk Table                     Men   Women  1/2 Average Risk   3.4   3.3  Average Risk       5.0   4.4  2 X Average Risk   9.6   7.1  3 X Average Risk  23.4   11.0        Use the calculated Patient Ratio above and the CHD Risk Table to determine the patient's CHD  Risk.        ATP III CLASSIFICATION (LDL):  <100     mg/dL   Optimal  100-129  mg/dL   Near or Above                    Optimal  130-159  mg/dL   Borderline  160-189  mg/dL   High  >190     mg/dL   Very High 03/07/2010   ALT 41* 01/19/2014   AST 28 01/19/2014   NA 138 01/23/2014   K 4.5 01/23/2014   CL 88* 01/23/2014   CREATININE 0.91 01/25/2014   BUN 33* 01/23/2014   CO2 37* 01/23/2014   TSH 0.086* 01/22/2014   INR 0.96 08/03/2013   HGBA1C 8.7* 01/19/2014   MICROALBUR 0.5 09/03/2013    Dg Chest 2 View  01/18/2014   ONE CLINICAL DATA: History of congestion and nonproductive cough; history of COPD, diabetes, and congestive heart failure  EXAM: CHEST  2 VIEW  COMPARISON:  DG CHEST 2 VIEW dated 12/17/2013  FINDINGS: The lungs are well-expanded. The interstitial markings are increased bilaterally which is not entirely new. However, there are coarse lung markings in the retrocardiac region on the lateral film which are  more conspicuous than in the past. There is no pleural effusion. The cardiopericardial silhouette is top-normal in size. The central pulmonary vascularity is prominent but stable. The mediastinum is normal in width. There is no pneumothorax or pneumomediastinum. The observed portions of the bony thorax exhibit no acute abnormalities.  IMPRESSION: There are findings consistent with pulmonary fibrosis. However, the interstitial markings are increased over and above that seen on the previous study and confluent density in the retrocardiac region likely on the left is suspicious for pneumonia. There is no definite evidence of CHF. Followup films following therapy are recommended to assure clearing. Chest CT scanning may be ultimately needed.   Electronically Signed   By: David  Martinique   On: 01/18/2014 21:12   Ct Angio Chest Pe W/cm &/or Wo Cm  01/19/2014   CLINICAL DATA:  Shortness of breath  EXAM: CT ANGIOGRAPHY CHEST WITH CONTRAST  TECHNIQUE: Multidetector CT imaging of the chest was performed using the standard protocol during bolus administration of intravenous contrast. Multiplanar CT image reconstructions and MIPs were obtained to evaluate the vascular anatomy.  CONTRAST:  164mL OMNIPAQUE IOHEXOL 350 MG/ML SOLN  COMPARISON:  None.  FINDINGS: There is adequate opacification of the central pulmonary arteries with no central pulmonary embolus. The segmental and subsegmental branches are suboptimally opacified and are degraded by patient respiratory motion making these areas nondiagnostic. The main pulmonary artery, right main pulmonary artery and left main pulmonary arteries are normal in size. The heart size is normal. There is no pericardial effusion.  There are patchy areas of ground-glass opacity throughout bilateral lungs which may be secondary to an infectious or inflammatory etiology versus normal expiration with the areas lacking ground-glass opacity reflecting air trapping versus hypersensitivity  pneumonitis. There is no pleural effusion or pneumothorax. There is bibasilar mild atelectasis  There is no mediastinal or axillary lymphadenopathy. There is a mildly enlarged right hilar lymph node measuring 12 mm in short axis which may be reactive given the pulmonary findings.  There is no lytic or blastic osseous lesion.  The visualized portions of the upper abdomen are unremarkable.  Review of the MIP images confirms the above findings.  IMPRESSION: 1. Adequate opacification of the central pulmonary arteries with no central pulmonary embolus. The  segmental and subsegmental branches are suboptimally opacified and are degraded by patient respiratory motion making these areas nondiagnostic.  2. Patchy areas of ground-glass opacity throughout bilateral lungs which may be secondary to an infectious or inflammatory etiology versus normal expiration with the areas lacking ground-glass opacity reflecting air trapping versus hypersensitivity pneumonitis.   Electronically Signed   By: Kathreen Devoid   On: 01/19/2014 18:24     Dg Chest Port 1 View  01/19/2014   CLINICAL DATA:  Right PICC line placement  EXAM: PORTABLE CHEST - 1 VIEW  COMPARISON:  None.  FINDINGS: Right subclavian PICC line catheter tip is at the level of the distal right innominate vein. This could be advanced 12 cm to the SVC RA junction.  Mild cardiomegaly with slight interstitial edema diffusely. Bibasilar atelectasis versus scarring noted. No new focal pneumonia, collapse or consolidation. No large effusion or pneumothorax. Trachea is midline.  IMPRESSION: Right PICC line tip at the right innominate vein level, this could be advanced 12 cm to the SVC RA junction  Mild interstitial edema pattern, compatible with early CHF  Bibasilar atelectasis versus scarring   Electronically Signed   By: Daryll Brod M.D.   On: 01/19/2014 12:02       Assessment & Plan:   Problem List Items Addressed This Visit   Abnormal TSH      Sick euthyroid with  normal T3 and T4 during hosp 12/2013 Recheck TSH in 6-8 weeks Lab Results  Component Value Date   TSH 0.086* 01/22/2014       Acute-on-chronic respiratory failure - Primary     Acute exacerbation with COPD 06/2013, 07/2013 and 12/2013 - hospitalization for same reviewed - resolved to baseline Chronic hypoxic resp failure with COPD - continue bipap at home, on nasal mask since 10/14 hosp, but noncompliance with same reviewed baseline hypercarbia reviewed ongoing chronic inhalers (spiriva and symbicort) with prn nebulizer continue home oxygen and followup with pulmonary specialist as planned Commended pt on cessation of smoking and encouraged never to smoke again!    Chronic diastolic CHF (congestive heart failure)     freq exacerbations - acute exac with hosp 06/2013, 07/2013, 11/2013 for COPD flare  Chronic high dose diuretic - 100mg  demadex bid Hx zaroxlyn 3x/week 07/2012 but stopped due to ARF with over diuresis Also encouraged CPAP compliance and O2 continuous for chronic resp failure Continue monitor sodium intake and monitor weights at home - call if increase weight >3#/day Encouragement provided on general weight loss efforts follow up cards and pulm as planned    Diabetes type 2, uncontrolled      New dx 11/2011 - exacerbated by frequent steroid use for pulm dz Intol of metformin due to severe diarrhea side effects  On Lantus pen qhs and Amaryl in AM - titrate as needed Added low dose Victoza 10/2013- may also assist with patient's weight loss goals for controlling appetite?  Lab Results  Component Value Date   HGBA1C 8.7* 01/19/2014        Time spent with pt today 25 minutes, greater than 50% time spent counseling patient on diabetes, chronic resp failure and medication review. Also review of prior records

## 2014-01-28 NOTE — Assessment & Plan Note (Signed)
freq exacerbations - acute exac with hosp 06/2013, 07/2013, 11/2013 for COPD flare  Chronic high dose diuretic - 100mg  demadex bid Hx zaroxlyn 3x/week 07/2012 but stopped due to ARF with over diuresis Also encouraged CPAP compliance and O2 continuous for chronic resp failure Continue monitor sodium intake and monitor weights at home - call if increase weight >3#/day Encouragement provided on general weight loss efforts follow up cards and pulm as planned

## 2014-01-28 NOTE — Progress Notes (Addendum)
[name] [id number] 01/29/2014  Chief Complaint  Patient presents with  . Follow-up    Hosp f/u- Discuss insulin    Subjective  HPI  Patient here for hospital f/u and to get new insulin prescription .  Hospital admission (01/18/14-01/25/14) for COPD exacerbation:  Presented w/ SOB, wheezing, acute respiratory failure, and cough. Given IV abx/IV steroids and placed on BiPap.   CT scan 01/19/14- ground glass opacities BL in lungs suggestive of pneumonia. No evidence of PE. Given IV vancomycin and IV ceftriaxone in hospital and prescribed 4 tablets of cefuroxime at discharge that she has not filled.  BG was elevated in hospital (as high as 513), likely due to IV steroids. Lantus and Novolog doses were increased to 30 units lantus BID and Novolog 10 units TID before meals. Needs prescription for new dose of Lantus since she is running out, though was told in hospital she could not get refill since she is not at 3 month mark. BG has been better since out of hospital (around 200 now).  COPD: Currently on home oxygen, symbicort, spiriva, BiPap, and albuterol PRN. Has stopped smoking completely since discharged from hospital.   Pertinent positives: congestion  Pertinent negatives: cough, SOB, CP, night sweats, fever, body aches, chills, HA, myalgias    Past Medical History  Diagnosis Date  . Hypertension   . Hyperlipidemia   . Chronic headache   . Fibromyalgia     daily narcotics  . Anxiety     hx chronic BZ use, stopped 07/2010  . Anemia   . Pulmonary sarcoidosis     unimpressive CT chest 2011  . Colonic polyp   . GERD (gastroesophageal reflux disease)   . ALLERGIC RHINITIS   . Asthma   . CHF (congestive heart failure)     Diastolic with fluid overload, May, 2012, LVEF 60%  . Morbid obesity   . Depression   . Panic attacks   . Diabetes mellitus   . COPD (chronic obstructive pulmonary disease)     on home O2, moderate airflow obstruction, suspect d/t  emphysema  . Obesity   . Elevated LFTs 09/2011  . Ovarian cyst   . Chronic back pain   . Sleep apnea      CPAP    Past Surgical History  Procedure Laterality Date  . Polypectomy  2011  . Lumbar microdiscectomy  07/06/2011    R L4-5, stern  . Back surgery    . Carpal tunnel release    . Steroid spinal injections    . Hysteroscopy w/d&c N/A 03/25/2013    Procedure: DILATATION AND CURETTAGE /HYSTEROSCOPY;  Surgeon: [name], MD;  Location: [facility];  Service: Gynecology;  Laterality: N/A;  . Dilation and curettage of uterus      Family History  Problem Relation Age of Onset  . Hypertension Mother   . Emphysema Father   . Hypertension Father   . Stomach cancer Father   . Allergies Brother   . Hypertension Brother   . Stomach cancer Brother   . Heart disease Mother   . Heart disease Father   . Heart disease Brother     died age 4 sudden death/MI    History  Substance Use Topics  . Smoking status: Current Every Day Smoker -- 0.25 packs/day for 29 years    Types: Cigarettes  . Smokeless tobacco: Never Used     Comment: Resumed smoking September 2013 --2-3 CIGS DAILY AND E-CIG  . Alcohol Use: Yes  Comment: occasionally    Current Outpatient Prescriptions on File Prior to Visit  Medication Sig Dispense Refill  . albuterol (PROAIR HFA) 108 (90 BASE) MCG/ACT inhaler INHALE 2 PUFFS BY MOUTH EVERY 6 HOURS AS NEEDED  8.5 g  3  . albuterol (PROVENTIL HFA;VENTOLIN HFA) 108 (90 BASE) MCG/ACT inhaler Inhale 2 puffs into the lungs every 6 (six) hours as needed for wheezing or shortness of breath.      Marland Kitchen albuterol (PROVENTIL) (2.5 MG/3ML) 0.083% nebulizer solution Take 2.5 mg by nebulization every 6 (six) hours as needed for wheezing.      Marland Kitchen ALPRAZolam (XANAX) 1 MG tablet Take 1 tablet (1 mg total) by mouth 3 (three) times daily as needed for anxiety.  30 tablet  0  . aspirin EC 81 MG tablet Take 1 tablet (81 mg total) by mouth daily.  150 tablet  2  . Blood Glucose Monitoring  Suppl (ONE TOUCH ULTRA 2) W/DEVICE KIT Use as directed  Dx 250.00  1 each  0  . budesonide-formoterol (SYMBICORT) 160-4.5 MCG/ACT inhaler Inhale 2 puffs into the lungs 2 (two) times daily.  1 Inhaler  2  . cetirizine (ZYRTEC) 1 MG/ML syrup Take 10 mLs (10 mg total) by mouth daily.  480 mL  1  . chlorpheniramine-HYDROcodone (TUSSIONEX) 10-8 MG/5ML LQCR Take 5 mLs by mouth every 12 (twelve) hours as needed for cough.  115 mL  0  . esomeprazole (NEXIUM) 40 MG capsule Take 1 capsule (40 mg total) by mouth 2 (two) times daily.  60 capsule  5  . fluticasone (FLONASE) 50 MCG/ACT nasal spray Place 2 sprays into both nostrils daily as needed for rhinitis.  16 g  2  . glimepiride (AMARYL) 1 MG tablet Take 1 mg by mouth daily before breakfast.      . glucose blood (ONE TOUCH ULTRA TEST) test strip Use to check blood sugars twice a day Dx 250.00  60 each  11  . guaiFENesin (MUCINEX) 600 MG 12 hr tablet Take 1 tablet (600 mg total) by mouth 2 (two) times daily.      . hydrOXYzine (ATARAX/VISTARIL) 10 MG tablet Take 1 tablet (10 mg total) by mouth 3 (three) times daily as needed for itching.  60 tablet  1  . insulin aspart (NOVOLOG) 100 UNIT/ML injection Inject 10 Units into the skin 3 (three) times daily with meals. CBG less than 140: 0 units 141-200: 2 units 201-250: 4 units 251-300: 6 units 301-350: 8 units 351-400: 10 units 400+: call MD  10 mL  11  . insulin glargine (LANTUS) 100 UNIT/ML injection Inject 0.3 mLs (30 Units total) into the skin 2 (two) times daily. Takes 20 units every evening. Also takes 20 units in the morning only if sugar is over 200  10 mL  11  . Liraglutide (VICTOZA) 18 MG/3ML SOPN Inject 0.6 mg into the skin daily.       Glory Rosebush DELICA LANCETS 22Q MISC Use to help check blood sugars twice a day Dx 250.00  60 each  11  . potassium chloride SA (K-DUR,KLOR-CON) 20 MEQ tablet Take 40 mEq by mouth 2 (two) times daily.      . sertraline (ZOLOFT) 100 MG tablet Take 100 mg by mouth  daily.      Marland Kitchen spironolactone (ALDACTONE) 25 MG tablet Take 1 tablet (25 mg total) by mouth 2 (two) times daily.  60 tablet  5  . tiotropium (SPIRIVA) 18 MCG inhalation capsule Place 18 mcg into inhaler  and inhale daily.      Marland Kitchen torsemide (DEMADEX) 100 MG tablet Take 100 mg by mouth 2 (two) times daily.      . varenicline (CHANTIX STARTING MONTH PAK) 0.5 MG X 11 & 1 MG X 42 tablet Take one 0.5 mg tablet by mouth once daily for 3 days, then increase to one 0.5 mg tablet twice daily for 4 days, then increase to one 1 mg tablet twice daily.  53 tablet  0   No current facility-administered medications on file prior to visit.     Allergies: Allergies  Allergen Reactions  . Simvastatin Other (See Comments)    Severe leg pain and burning  . Sulfamethoxazole-Trimethoprim Nausea And Vomiting    Causes Projectile Vomiting  . Zolpidem Tartrate Other (See Comments)    Chest pain  . Azithromycin Other (See Comments)    Resistant to med  . Doxycycline Other (See Comments)    Resistant to med  . Latex Itching and Rash    Also burning sensations  . Metformin And Related Diarrhea    Severe diarrhea  . Metronidazole Nausea And Vomiting  . Metolazone Other (See Comments)    Cramps,pain  . Ciprofloxacin Nausea Only    Review of Systems  Constitutional: Negative for fever, chills, weight loss and malaise/fatigue.  HENT: Positive for congestion. Negative for nosebleeds, sore throat and tinnitus.   Eyes: Negative for blurred vision and photophobia.  Respiratory: Negative for cough, sputum production and wheezing.   Cardiovascular: Positive for leg swelling. Negative for chest pain, palpitations and orthopnea.       Slight swelling in ankles BL.  Gastrointestinal: Positive for nausea. Negative for vomiting, abdominal pain, diarrhea and constipation.  Genitourinary:       No urinary changes.  Neurological: Negative for dizziness, tremors, weakness and headaches.       Objective  Filed Vitals:     01/28/14 1130  BP: 120/82  Pulse: 88  Temp: 98.8 F (37.1 C)  TempSrc: Oral  Weight: 298 lb 6.4 oz (135.353 kg)  SpO2: 94%    Physical Exam  Constitutional: She is oriented to person, place, and time. No distress.  Morbidly obese in the exam room w/ nasal cannula and O2. In NAD.  HENT:  Right Ear: External ear normal.  Left Ear: External ear normal.  Nose: Nose normal.  Mouth/Throat: Oropharynx is clear and moist. No oropharyngeal exudate.  Eyes: Conjunctivae are normal. Pupils are equal, round, and reactive to light. Right eye exhibits no discharge.  Neck: Neck supple. No thyromegaly present.  Cardiovascular: Normal rate, regular rhythm and normal heart sounds.  Exam reveals no gallop and no friction rub.   No murmur heard. Respiratory: Effort normal and breath sounds normal. No respiratory distress. She has no wheezes. She has no rales.  Musculoskeletal: She exhibits edema. She exhibits no tenderness.  Slight edema in ankles BL.  Lymphadenopathy:    She has no cervical adenopathy.  Neurological: She is alert and oriented to person, place, and time. Coordination normal.  Skin:  Acanthos nigrans in creases of neck BL. No cyanosis on distal extremities or lips. Normal capillary refill.  Psychiatric: She has a normal mood and affect. Her behavior is normal. Judgment and thought content normal.    BP Readings from Last 3 Encounters:  01/28/14 120/82  01/25/14 106/66  12/17/13 108/48    Wt Readings from Last 3 Encounters:  01/28/14 298 lb 6.4 oz (135.353 kg)  01/25/14 296 lb 15.4 oz (134.7 kg)  12/10/13 297 lb 3.2 oz (134.809 kg)    Lab Results  Component Value Date   WBC 15.7* 01/25/2014   HGB 13.3 01/25/2014   HCT 40.9 01/25/2014   PLT 178 01/25/2014   GLUCOSE 202* 01/23/2014   CHOL 304* 09/03/2013   TRIG 340.0* 09/03/2013   HDL 49.50 09/03/2013   LDLDIRECT 204.3 09/03/2013   LDLCALC  Value: 92        Total Cholesterol/HDL:CHD Risk Coronary Heart Disease Risk Table                      Men   Women  1/2 Average Risk   3.4   3.3  Average Risk       5.0   4.4  2 X Average Risk   9.6   7.1  3 X Average Risk  23.4   11.0        Use the calculated Patient Ratio above and the CHD Risk Table to determine the patient's CHD Risk.        ATP III CLASSIFICATION (LDL):  <100     mg/dL   Optimal  100-129  mg/dL   Near or Above                    Optimal  130-159  mg/dL   Borderline  160-189  mg/dL   High  >190     mg/dL   Very High 03/07/2010   ALT 41* 01/19/2014   AST 28 01/19/2014   NA 138 01/23/2014   K 4.5 01/23/2014   CL 88* 01/23/2014   CREATININE 0.91 01/25/2014   BUN 33* 01/23/2014   CO2 37* 01/23/2014   TSH 0.086* 01/22/2014   INR 0.96 08/03/2013   HGBA1C 8.7* 01/19/2014   MICROALBUR 0.5 09/03/2013    Dg Chest 2 View  01/18/2014   ONE CLINICAL DATA: History of congestion and nonproductive cough; history of COPD, diabetes, and congestive heart failure  EXAM: CHEST  2 VIEW  COMPARISON:  DG CHEST 2 VIEW dated 12/17/2013  FINDINGS: The lungs are well-expanded. The interstitial markings are increased bilaterally which is not entirely new. However, there are coarse lung markings in the retrocardiac region on the lateral film which are more conspicuous than in the past. There is no pleural effusion. The cardiopericardial silhouette is top-normal in size. The central pulmonary vascularity is prominent but stable. The mediastinum is normal in width. There is no pneumothorax or pneumomediastinum. The observed portions of the bony thorax exhibit no acute abnormalities.  IMPRESSION: There are findings consistent with pulmonary fibrosis. However, the interstitial markings are increased over and above that seen on the previous study and confluent density in the retrocardiac region likely on the left is suspicious for pneumonia. There is no definite evidence of CHF. Followup films following therapy are recommended to assure clearing. Chest CT scanning may be ultimately needed.   Electronically Signed    By: David  Martinique   On: 01/18/2014 21:12   Ct Angio Chest Pe W/cm &/or Wo Cm  01/19/2014   CLINICAL DATA:  Shortness of breath  EXAM: CT ANGIOGRAPHY CHEST WITH CONTRAST  TECHNIQUE: Multidetector CT imaging of the chest was performed using the standard protocol during bolus administration of intravenous contrast. Multiplanar CT image reconstructions and MIPs were obtained to evaluate the vascular anatomy.  CONTRAST:  179m OMNIPAQUE IOHEXOL 350 MG/ML SOLN  COMPARISON:  None.  FINDINGS: There is adequate opacification of the central pulmonary arteries with no  central pulmonary embolus. The segmental and subsegmental branches are suboptimally opacified and are degraded by patient respiratory motion making these areas nondiagnostic. The main pulmonary artery, right main pulmonary artery and left main pulmonary arteries are normal in size. The heart size is normal. There is no pericardial effusion.  There are patchy areas of ground-glass opacity throughout bilateral lungs which may be secondary to an infectious or inflammatory etiology versus normal expiration with the areas lacking ground-glass opacity reflecting air trapping versus hypersensitivity pneumonitis. There is no pleural effusion or pneumothorax. There is bibasilar mild atelectasis  There is no mediastinal or axillary lymphadenopathy. There is a mildly enlarged right hilar lymph node measuring 12 mm in short axis which may be reactive given the pulmonary findings.  There is no lytic or blastic osseous lesion.  The visualized portions of the upper abdomen are unremarkable.  Review of the MIP images confirms the above findings.  IMPRESSION: 1. Adequate opacification of the central pulmonary arteries with no central pulmonary embolus. The segmental and subsegmental branches are suboptimally opacified and are degraded by patient respiratory motion making these areas nondiagnostic.  2. Patchy areas of ground-glass opacity throughout bilateral lungs which may be  secondary to an infectious or inflammatory etiology versus normal expiration with the areas lacking ground-glass opacity reflecting air trapping versus hypersensitivity pneumonitis.   Electronically Signed   By: Kathreen Devoid   On: 01/19/2014 18:24   Ir US Guide Vasc Access Right  01/19/2014   CLINICAL DATA:  Poor peripheral IV access, need for additional central venous access and access to administer IV contrast for CT scan.  EXAM: POWER PICC LINE PLACEMENT WITH ULTRASOUND AND FLUOROSCOPIC GUIDANCE  FLUOROSCOPY TIME:  12 seconds.  PROCEDURE: The patient was advised of the possible risks and complications and agreed to undergo the procedure. The patient was then brought to the angiographic suite for the procedure.  The right arm was prepped with chlorhexidine, draped in the usual sterile fashion using maximum barrier technique (cap and mask, sterile gown, sterile gloves, large sterile sheet, hand hygiene and cutaneous antisepsis) and infiltrated locally with 1% Lidocaine.  Ultrasound demonstrated patency of the right brachial vein, and this was documented with an image. Under real-time ultrasound guidance, this vein was accessed with a 21 gauge micropuncture needle and image documentation was performed. A 0.018 wire was introduced in to the vein. Over this, a 5.0 [place] dual lumen power injectable PICC cut to 41 cm was advanced to the lower SVC/right atrial junction. Fluoroscopy during the procedure and fluoro spot radiograph confirms appropriate catheter position. The catheter was flushed and covered with a sterile dressing.  Complications: None  IMPRESSION: Successful right arm power injectable PICC line placement with ultrasound and fluoroscopic guidance. The catheter is ready for use.   Electronically Signed   By: Aletta Edouard M.D.   On: 01/19/2014 17:16   Dg Chest Port 1 View  01/19/2014   CLINICAL DATA:  Right PICC line placement  EXAM: PORTABLE CHEST - 1 VIEW  COMPARISON:  None.  FINDINGS: Right  subclavian PICC line catheter tip is at the level of the distal right innominate vein. This could be advanced 12 cm to the SVC RA junction.  Mild cardiomegaly with slight interstitial edema diffusely. Bibasilar atelectasis versus scarring noted. No new focal pneumonia, collapse or consolidation. No large effusion or pneumothorax. Trachea is midline.  IMPRESSION: Right PICC line tip at the right innominate vein level, this could be advanced 12 cm to the SVC RA junction  Mild interstitial edema pattern, compatible with early CHF  Bibasilar atelectasis versus scarring   Electronically Signed   By: Daryll Brod M.D.   On: 01/19/2014 12:02   Ir Fluoro Guide Cv Midline Picc Right  01/19/2014   CLINICAL DATA:  Poor peripheral IV access, need for additional central venous access and access to administer IV contrast for CT scan.  EXAM: POWER PICC LINE PLACEMENT WITH ULTRASOUND AND FLUOROSCOPIC GUIDANCE  FLUOROSCOPY TIME:  12 seconds.  PROCEDURE: The patient was advised of the possible risks and complications and agreed to undergo the procedure. The patient was then brought to the angiographic suite for the procedure.  The right arm was prepped with chlorhexidine, draped in the usual sterile fashion using maximum barrier technique (cap and mask, sterile gown, sterile gloves, large sterile sheet, hand hygiene and cutaneous antisepsis) and infiltrated locally with 1% Lidocaine.  Ultrasound demonstrated patency of the right brachial vein, and this was documented with an image. Under real-time ultrasound guidance, this vein was accessed with a 21 gauge micropuncture needle and image documentation was performed. A 0.018 wire was introduced in to the vein. Over this, a 5.0 [place] dual lumen power injectable PICC cut to 41 cm was advanced to the lower SVC/right atrial junction. Fluoroscopy during the procedure and fluoro spot radiograph confirms appropriate catheter position. The catheter was flushed and covered with a sterile  dressing.  Complications: None  IMPRESSION: Successful right arm power injectable PICC line placement with ultrasound and fluoroscopic guidance. The catheter is ready for use.   Electronically Signed   By: Aletta Edouard M.D.   On: 01/19/2014 17:16       Assessment and Plan  COPD w/ acute/chronic respiratory failure: COPD exacerbation in hospital 01/19/14. Has finished abx for CAP found on CT scan in hospital and has d/c'd smoking. Stable currently w/ no SOB, CP, wheezing.O2:95, respirations and HR normal, lung exam normal w/ normal capillary refill and no cyanosis. Told patient to continue home regimen of O2, symbicort, spiriva, BiPap and albuterol inhaler PRN.Marland Kitchen Encouraged patient to continue not smoking.   OSA: Counseled patient on weight loss and diet. Told patient to use CPAP every night.  DM II: BG better controlled now that she is off of steroids (around 200). Wrote prescription for lantus 30 units TID and novolog 10 units TID. Prescribed Victoza 10/2103 and educated patient on benefits of weight loss with this medication. Will order A1C 3 months from now to assess new medication regimen.   Will check TSH in 6 weeks since it was slightly low (.083) w/ normal T3/T4 in the hospital, possibly due to "euthyroid sick syndrome".   Return in about 6 weeks (around 03/11/2014) for recheck and medication adjustment (if needed), recheck diabetes and labs. Berenice Bouton, Student-PA

## 2014-01-29 ENCOUNTER — Telehealth: Payer: Self-pay | Admitting: Internal Medicine

## 2014-01-29 NOTE — Telephone Encounter (Signed)
Relevant patient education assigned to patient using Emmi. ° °

## 2014-02-03 ENCOUNTER — Other Ambulatory Visit: Payer: Self-pay | Admitting: Internal Medicine

## 2014-02-03 NOTE — Telephone Encounter (Signed)
Declined as on Spironolactone & Demadex. May have been Rxed by Cardiology

## 2014-02-03 NOTE — Telephone Encounter (Signed)
[name] patient I do not see this med on current med list. Is this okay?

## 2014-02-05 ENCOUNTER — Telehealth: Payer: Self-pay

## 2014-02-05 NOTE — Telephone Encounter (Signed)
Relevant patient education assigned to patient using Emmi. ° °

## 2014-02-06 ENCOUNTER — Telehealth: Payer: Self-pay | Admitting: Pulmonary Disease

## 2014-02-06 MED ORDER — CEFDINIR 300 MG PO CAPS: ORAL_CAPSULE | ORAL | Status: DC

## 2014-02-06 NOTE — Telephone Encounter (Signed)
Pt recently d/c'd from hospital 01/25/14. She c/o prod cough-yellow phlem, chest congestion, slight wheezing/chest tx, increase SOB w/ rest/acitivty. She also has noticed swelling in both feet. She is not taking anything for her symptoms and reports she is just resting. Offered OV today but refused. She reports she has not transportation to be seen today and no one that can bring her. Please advise KC thanks  Allergies  Allergen Reactions  . Simvastatin Other (See Comments)    Severe leg pain and burning  . Sulfamethoxazole-Trimethoprim Nausea And Vomiting    Causes Projectile Vomiting  . Zolpidem Tartrate Other (See Comments)    Chest pain  . Azithromycin Other (See Comments)    Resistant to med  . Doxycycline Other (See Comments)    Resistant to med  . Latex Itching and Rash    Also burning sensations  . Metformin And Related Diarrhea    Severe diarrhea  . Metronidazole Nausea And Vomiting  . Metolazone Other (See Comments)    Cramps,pain  . Ciprofloxacin Nausea Only     Current Outpatient Prescriptions on File Prior to Visit  Medication Sig Dispense Refill  . albuterol (PROAIR HFA) 108 (90 BASE) MCG/ACT inhaler INHALE 2 PUFFS BY MOUTH EVERY 6 HOURS AS NEEDED  8.5 g  3  . albuterol (PROVENTIL HFA;VENTOLIN HFA) 108 (90 BASE) MCG/ACT inhaler Inhale 2 puffs into the lungs every 6 (six) hours as needed for wheezing or shortness of breath.      Marland Kitchen albuterol (PROVENTIL) (2.5 MG/3ML) 0.083% nebulizer solution Take 2.5 mg by nebulization every 6 (six) hours as needed for wheezing.      Marland Kitchen ALPRAZolam (XANAX) 1 MG tablet Take 1 tablet (1 mg total) by mouth 3 (three) times daily as needed for anxiety.  30 tablet  0  . aspirin EC 81 MG tablet Take 1 tablet (81 mg total) by mouth daily.  150 tablet  2  . Blood Glucose Monitoring Suppl (ONE TOUCH ULTRA 2) W/DEVICE KIT Use as directed  Dx 250.00  1 each  0  . budesonide-formoterol (SYMBICORT) 160-4.5 MCG/ACT inhaler Inhale 2 puffs into the lungs 2  (two) times daily.  1 Inhaler  2  . cetirizine (ZYRTEC) 1 MG/ML syrup Take 10 mLs (10 mg total) by mouth daily.  480 mL  1  . chlorpheniramine-HYDROcodone (TUSSIONEX) 10-8 MG/5ML LQCR Take 5 mLs by mouth every 12 (twelve) hours as needed for cough.  115 mL  0  . esomeprazole (NEXIUM) 40 MG capsule Take 1 capsule (40 mg total) by mouth 2 (two) times daily.  60 capsule  5  . fluticasone (FLONASE) 50 MCG/ACT nasal spray Place 2 sprays into both nostrils daily as needed for rhinitis.  16 g  2  . glimepiride (AMARYL) 1 MG tablet Take 1 mg by mouth daily before breakfast.      . glucose blood (ONE TOUCH ULTRA TEST) test strip Use to check blood sugars twice a day Dx 250.00  60 each  11  . guaiFENesin (MUCINEX) 600 MG 12 hr tablet Take 1 tablet (600 mg total) by mouth 2 (two) times daily.      . hydrOXYzine (ATARAX/VISTARIL) 10 MG tablet Take 1 tablet (10 mg total) by mouth 3 (three) times daily as needed for itching.  60 tablet  1  . insulin aspart (NOVOLOG) 100 UNIT/ML injection Inject 10 Units into the skin 3 (three) times daily with meals. CBG less than 140: 0 units 141-200: 2 units 201-250: 4 units 251-300: 6  units 301-350: 8 units 351-400: 10 units 400+: call MD  10 mL  11  . insulin glargine (LANTUS) 100 UNIT/ML injection Inject 0.3 mLs (30 Units total) into the skin 2 (two) times daily. Takes 20 units every evening. Also takes 20 units in the morning only if sugar is over 200  10 mL  11  . Liraglutide (VICTOZA) 18 MG/3ML SOPN Inject 0.6 mg into the skin daily.       Glory Rosebush DELICA LANCETS 06T MISC Use to help check blood sugars twice a day Dx 250.00  60 each  11  . Oxycodone HCl 10 MG TABS Take 1 tablet (10 mg total) by mouth 3 (three) times daily as needed (pain). May fill not more than once every 30 days.  90 tablet  0  . potassium chloride SA (K-DUR,KLOR-CON) 20 MEQ tablet Take 40 mEq by mouth 2 (two) times daily.      . sertraline (ZOLOFT) 100 MG tablet Take 100 mg by mouth daily.       Marland Kitchen spironolactone (ALDACTONE) 25 MG tablet Take 1 tablet (25 mg total) by mouth 2 (two) times daily.  60 tablet  5  . tiotropium (SPIRIVA) 18 MCG inhalation capsule Place 18 mcg into inhaler and inhale daily.      Marland Kitchen tiZANidine (ZANAFLEX) 4 MG tablet Take 1 tablet (4 mg total) by mouth every 8 (eight) hours as needed for muscle spasms.  30 tablet  0  . torsemide (DEMADEX) 100 MG tablet Take 100 mg by mouth 2 (two) times daily.      . varenicline (CHANTIX STARTING MONTH PAK) 0.5 MG X 11 & 1 MG X 42 tablet Take one 0.5 mg tablet by mouth once daily for 3 days, then increase to one 0.5 mg tablet twice daily for 4 days, then increase to one 1 mg tablet twice daily.  53 tablet  0   No current facility-administered medications on file prior to visit.

## 2014-02-06 NOTE — Telephone Encounter (Signed)
1) make sure she is not smoking.  If she is, she will never stay well. 2) omnicef 300mg , take 2 each am for 5 days. 3) needs ov with someone next week for evaluation.

## 2014-02-06 NOTE — Telephone Encounter (Signed)
Spoke with pt.S he reports she stopped smoking 3 weeks ago. She scheduled appt to see [facility] 02/10/14 at 9:15. RX has been sent in nothing further needed.

## 2014-02-09 ENCOUNTER — Telehealth: Payer: Self-pay | Admitting: Pulmonary Disease

## 2014-02-09 ENCOUNTER — Ambulatory Visit: Payer: Self-pay | Admitting: Internal Medicine

## 2014-02-09 NOTE — Telephone Encounter (Signed)
Called spoke with pt. Since [facility] is off she scheduled an appt to come in and see TP Friday at 2:30 for f/u per [facility] last phone note. Nothing further needed

## 2014-02-10 ENCOUNTER — Encounter (HOSPITAL_COMMUNITY): Payer: Self-pay | Admitting: Emergency Medicine

## 2014-02-10 ENCOUNTER — Emergency Department (HOSPITAL_COMMUNITY)
Admission: EM | Admit: 2014-02-10 | Discharge: 2014-02-11 | Disposition: A | Discharge status: Home / Self Care | Payer: Medicare Other | Attending: Emergency Medicine | Admitting: Emergency Medicine

## 2014-02-10 ENCOUNTER — Ambulatory Visit: Payer: Self-pay | Admitting: Pulmonary Disease

## 2014-02-10 DIAGNOSIS — F410 Panic disorder [episodic paroxysmal anxiety] without agoraphobia: Secondary | ICD-10-CM | POA: Insufficient documentation

## 2014-02-10 DIAGNOSIS — E119 Type 2 diabetes mellitus without complications: Secondary | ICD-10-CM | POA: Insufficient documentation

## 2014-02-10 DIAGNOSIS — Z794 Long term (current) use of insulin: Secondary | ICD-10-CM | POA: Insufficient documentation

## 2014-02-10 DIAGNOSIS — J45901 Unspecified asthma with (acute) exacerbation: Secondary | ICD-10-CM

## 2014-02-10 DIAGNOSIS — Z860100 Personal history of colon polyps, unspecified: Secondary | ICD-10-CM | POA: Insufficient documentation

## 2014-02-10 DIAGNOSIS — Z91040 Latex allergy status: Secondary | ICD-10-CM | POA: Insufficient documentation

## 2014-02-10 DIAGNOSIS — Z7982 Long term (current) use of aspirin: Secondary | ICD-10-CM | POA: Insufficient documentation

## 2014-02-10 DIAGNOSIS — G4730 Sleep apnea, unspecified: Secondary | ICD-10-CM | POA: Insufficient documentation

## 2014-02-10 DIAGNOSIS — Z9981 Dependence on supplemental oxygen: Secondary | ICD-10-CM | POA: Insufficient documentation

## 2014-02-10 DIAGNOSIS — F3289 Other specified depressive episodes: Secondary | ICD-10-CM | POA: Insufficient documentation

## 2014-02-10 DIAGNOSIS — M549 Dorsalgia, unspecified: Secondary | ICD-10-CM | POA: Insufficient documentation

## 2014-02-10 DIAGNOSIS — G8929 Other chronic pain: Secondary | ICD-10-CM | POA: Insufficient documentation

## 2014-02-10 DIAGNOSIS — I10 Essential (primary) hypertension: Secondary | ICD-10-CM | POA: Insufficient documentation

## 2014-02-10 DIAGNOSIS — R739 Hyperglycemia, unspecified: Secondary | ICD-10-CM

## 2014-02-10 DIAGNOSIS — F17200 Nicotine dependence, unspecified, uncomplicated: Secondary | ICD-10-CM | POA: Insufficient documentation

## 2014-02-10 DIAGNOSIS — R51 Headache: Secondary | ICD-10-CM | POA: Insufficient documentation

## 2014-02-10 DIAGNOSIS — R42 Dizziness and giddiness: Secondary | ICD-10-CM | POA: Insufficient documentation

## 2014-02-10 DIAGNOSIS — Z79899 Other long term (current) drug therapy: Secondary | ICD-10-CM | POA: Insufficient documentation

## 2014-02-10 DIAGNOSIS — F329 Major depressive disorder, single episode, unspecified: Secondary | ICD-10-CM | POA: Insufficient documentation

## 2014-02-10 DIAGNOSIS — J441 Chronic obstructive pulmonary disease with (acute) exacerbation: Secondary | ICD-10-CM | POA: Insufficient documentation

## 2014-02-10 DIAGNOSIS — I5030 Unspecified diastolic (congestive) heart failure: Secondary | ICD-10-CM | POA: Insufficient documentation

## 2014-02-10 DIAGNOSIS — Z8742 Personal history of other diseases of the female genital tract: Secondary | ICD-10-CM | POA: Insufficient documentation

## 2014-02-10 DIAGNOSIS — K219 Gastro-esophageal reflux disease without esophagitis: Secondary | ICD-10-CM | POA: Insufficient documentation

## 2014-02-10 DIAGNOSIS — Z862 Personal history of diseases of the blood and blood-forming organs and certain disorders involving the immune mechanism: Secondary | ICD-10-CM | POA: Insufficient documentation

## 2014-02-10 DIAGNOSIS — Z8619 Personal history of other infectious and parasitic diseases: Secondary | ICD-10-CM | POA: Insufficient documentation

## 2014-02-10 LAB — CBC WITH DIFFERENTIAL/PLATELET
BASOS ABS: 0 10*3/uL (ref 0.0–0.1)
BASOS PCT: 0 % (ref 0–1)
EOS ABS: 0.2 10*3/uL (ref 0.0–0.7)
Eosinophils Relative: 2 % (ref 0–5)
HCT: 40.6 % (ref 36.0–46.0)
Hemoglobin: 13.4 g/dL (ref 12.0–15.0)
Lymphocytes Relative: 11 % — ABNORMAL LOW (ref 12–46)
Lymphs Abs: 0.8 10*3/uL (ref 0.7–4.0)
MCH: 27 pg (ref 26.0–34.0)
MCHC: 33 g/dL (ref 30.0–36.0)
MCV: 81.7 fL (ref 78.0–100.0)
Monocytes Absolute: 0.5 10*3/uL (ref 0.1–1.0)
Monocytes Relative: 7 % (ref 3–12)
Neutro Abs: 5.3 10*3/uL (ref 1.7–7.7)
Neutrophils Relative %: 80 % — ABNORMAL HIGH (ref 43–77)
PLATELETS: 138 10*3/uL — AB (ref 150–400)
RBC: 4.97 MIL/uL (ref 3.87–5.11)
RDW: 19.3 % — ABNORMAL HIGH (ref 11.5–15.5)
WBC: 6.7 10*3/uL (ref 4.0–10.5)

## 2014-02-10 LAB — CBG MONITORING, ED: Glucose-Capillary: 520 mg/dL — ABNORMAL HIGH (ref 70–99)

## 2014-02-10 NOTE — ED Notes (Signed)
Pt. reports elevated blood sugar this evening = 485 , pt. took Lantus 30 units and Novolog 20 units this evening , blurred vision and mild SOB .

## 2014-02-11 ENCOUNTER — Telehealth: Payer: Self-pay | Admitting: *Deleted

## 2014-02-11 ENCOUNTER — Emergency Department (HOSPITAL_COMMUNITY): Payer: Medicare Other

## 2014-02-11 LAB — URINALYSIS, ROUTINE W REFLEX MICROSCOPIC
Bilirubin Urine: NEGATIVE
HGB URINE DIPSTICK: NEGATIVE
KETONES UR: NEGATIVE mg/dL
LEUKOCYTES UA: NEGATIVE
Nitrite: NEGATIVE
Protein, ur: NEGATIVE mg/dL
Specific Gravity, Urine: 1.013 (ref 1.005–1.030)
Urobilinogen, UA: 0.2 mg/dL (ref 0.0–1.0)
pH: 6 (ref 5.0–8.0)

## 2014-02-11 LAB — COMPREHENSIVE METABOLIC PANEL
ALBUMIN: 3.4 g/dL — AB (ref 3.5–5.2)
ALK PHOS: 161 U/L — AB (ref 39–117)
ALT: 55 U/L — ABNORMAL HIGH (ref 0–35)
AST: 31 U/L (ref 0–37)
BILIRUBIN TOTAL: 0.4 mg/dL (ref 0.3–1.2)
BUN: 11 mg/dL (ref 6–23)
CHLORIDE: 89 meq/L — AB (ref 96–112)
CO2: 23 mEq/L (ref 19–32)
Calcium: 8.5 mg/dL (ref 8.4–10.5)
Creatinine, Ser: 0.98 mg/dL (ref 0.50–1.10)
GFR calc Af Amer: 78 mL/min — ABNORMAL LOW (ref >90)
GFR calc non Af Amer: 68 mL/min — ABNORMAL LOW (ref >90)
Glucose, Bld: 487 mg/dL — ABNORMAL HIGH (ref 70–99)
POTASSIUM: 4.3 meq/L (ref 3.7–5.3)
SODIUM: 128 meq/L — AB (ref 137–147)
TOTAL PROTEIN: 7.5 g/dL (ref 6.0–8.3)

## 2014-02-11 LAB — I-STAT TROPONIN, ED: TROPONIN I, POC: 0 ng/mL (ref 0.00–0.08)

## 2014-02-11 LAB — URINE MICROSCOPIC-ADD ON

## 2014-02-11 LAB — CBG MONITORING, ED: Glucose-Capillary: 287 mg/dL — ABNORMAL HIGH (ref 70–99)

## 2014-02-11 LAB — PRO B NATRIURETIC PEPTIDE: Pro B Natriuretic peptide (BNP): 32.8 pg/mL (ref 0–125)

## 2014-02-11 MED ORDER — SODIUM CHLORIDE 0.9 % IV BOLUS (SEPSIS)
500.0000 mL | Freq: Once | INTRAVENOUS | Status: AC
  Administered 2014-02-11: 01:00:00 via INTRAVENOUS

## 2014-02-11 MED ORDER — INSULIN ASPART 100 UNIT/ML IV SOLN
10.0000 [IU] | Freq: Once | INTRAVENOUS | Status: AC
  Administered 2014-02-11: 10 [IU] via INTRAVENOUS

## 2014-02-11 MED ORDER — TRAMADOL HCL 50 MG PO TABS
50.0000 mg | ORAL_TABLET | Freq: Once | ORAL | Status: DC
  Filled 2014-02-11: qty 1

## 2014-02-11 NOTE — Discharge Instructions (Signed)

## 2014-02-11 NOTE — ED Provider Notes (Signed)
CSN: [id number]     Arrival date & time 02/10/14  2320 History   First MD Initiated Contact with Patient 02/10/14 2350     Chief Complaint  Patient presents with  . Hyperglycemia     (Consider location/radiation/quality/duration/timing/severity/associated sxs/prior Treatment) HPI Patient was recently admitted to the hospital for COPD exacerbation. She's on 3 L of oxygen at home. She presents with elevated blood sugar this evening. She states she did not take her blood sugar yesterday. She's had mild increased shortness of breath with cough. It is nonproductive. She's had no fever or chills. She also admits to blurred vision and lightheadedness especially with standing. She denies any chest pain, abdominal pain, nausea or vomiting. She does complain of a mild frontal headache as gradual onset. She has no neck pain or stiffness. She has had increased urinary frequency but denies dysuria. She complains of a dry mouth. She admits compliance with her insulin. Past Medical History  Diagnosis Date  . Hypertension   . Hyperlipidemia   . Chronic headache   . Fibromyalgia     daily narcotics  . Anxiety     hx chronic BZ use, stopped 07/2010  . Anemia   . Pulmonary sarcoidosis     unimpressive CT chest 2011  . Colonic polyp   . GERD (gastroesophageal reflux disease)   . ALLERGIC RHINITIS   . Asthma   . CHF (congestive heart failure)     Diastolic with fluid overload, May, 2012, LVEF 60%  . Morbid obesity   . Depression   . Panic attacks   . Diabetes mellitus   . COPD (chronic obstructive pulmonary disease)     on home O2, moderate airflow obstruction, suspect d/t emphysema  . Obesity   . Elevated LFTs 09/2011  . Ovarian cyst   . Chronic back pain   . Sleep apnea      CPAP   Past Surgical History  Procedure Laterality Date  . Polypectomy  2011  . Lumbar microdiscectomy  07/06/2011    R L4-5, stern  . Back surgery    . Carpal tunnel release    . Steroid spinal injections    .  Hysteroscopy w/d&c N/A 03/25/2013    Procedure: DILATATION AND CURETTAGE /HYSTEROSCOPY;  Surgeon: [name], MD;  Location: [facility];  Service: Gynecology;  Laterality: N/A;  . Dilation and curettage of uterus     Family History  Problem Relation Age of Onset  . Hypertension Mother   . Emphysema Father   . Hypertension Father   . Stomach cancer Father   . Allergies Brother   . Hypertension Brother   . Stomach cancer Brother   . Heart disease Mother   . Heart disease Father   . Heart disease Brother     died age 78 sudden death/MI   History  Substance Use Topics  . Smoking status: Current Every Day Smoker -- 0.25 packs/day for 29 years    Types: Cigarettes  . Smokeless tobacco: Never Used     Comment: Resumed smoking September 2013 --2-3 CIGS DAILY AND E-CIG  . Alcohol Use: Yes     Comment: occasionally   OB History   Grav Para Term Preterm Abortions TAB SAB Ect Mult Living                 Review of Systems  Constitutional: Positive for fatigue. Negative for fever and chills.  Respiratory: Positive for cough and shortness of breath.   Cardiovascular: Negative for chest  pain, palpitations and leg swelling.  Gastrointestinal: Negative for nausea, vomiting, abdominal pain and diarrhea.  Endocrine: Positive for polydipsia.  Genitourinary: Positive for frequency. Negative for dysuria, flank pain and difficulty urinating.  Musculoskeletal: Positive for back pain and myalgias. Negative for neck pain and neck stiffness.  Skin: Negative for rash and wound.  Neurological: Positive for dizziness, light-headedness and headaches. Negative for seizures, syncope, weakness and numbness.  All other systems reviewed and are negative.     Allergies  Simvastatin; Sulfamethoxazole-trimethoprim; Zolpidem tartrate; Azithromycin; Doxycycline; Latex; Metformin and related; Metronidazole; Metolazone; and Ciprofloxacin  Home Medications   Prior to Admission medications   Medication Sig  Start Date End Date Taking? Authorizing Provider  albuterol (PROVENTIL HFA;VENTOLIN HFA) 108 (90 BASE) MCG/ACT inhaler Inhale 2 puffs into the lungs every 6 (six) hours as needed for wheezing or shortness of breath.   Yes Historical Provider, MD  albuterol (PROVENTIL) (2.5 MG/3ML) 0.083% nebulizer solution Take 2.5 mg by nebulization every 6 (six) hours as needed for wheezing.   Yes Historical Provider, MD  ALPRAZolam Duanne Moron) 1 MG tablet Take 1 tablet (1 mg total) by mouth 3 (three) times daily as needed for anxiety. 01/25/14  Yes Delfina Redwood, MD  aspirin EC 81 MG tablet Take 1 tablet (81 mg total) by mouth daily. 06/06/13  Yes Rowe Clack, MD  budesonide-formoterol (SYMBICORT) 160-4.5 MCG/ACT inhaler Inhale 2 puffs into the lungs 2 (two) times daily. 09/04/12  Yes Reyne Dumas, MD  cetirizine (ZYRTEC) 1 MG/ML syrup Take 10 mLs (10 mg total) by mouth daily. 01/07/14  Yes Rowe Clack, MD  chlorpheniramine-HYDROcodone (TUSSIONEX) 10-8 MG/5ML LQCR Take 5 mLs by mouth every 12 (twelve) hours as needed for cough. 01/25/14  Yes Delfina Redwood, MD  esomeprazole (NEXIUM) 40 MG capsule Take 1 capsule (40 mg total) by mouth 2 (two) times daily. 10/02/13  Yes Rowe Clack, MD  fluticasone (FLONASE) 50 MCG/ACT nasal spray Place 2 sprays into both nostrils daily as needed for rhinitis. 08/01/13  Yes Rowe Clack, MD  glimepiride (AMARYL) 1 MG tablet Take 1 mg by mouth daily before breakfast.   Yes Historical Provider, MD  hydrOXYzine (ATARAX/VISTARIL) 10 MG tablet Take 1 tablet (10 mg total) by mouth 3 (three) times daily as needed for itching. 10/08/13  Yes Rowe Clack, MD  insulin aspart (NOVOLOG) 100 UNIT/ML injection Inject 10 Units into the skin 3 (three) times daily with meals. CBG less than 140: 0 units 141-200: 2 units 201-250: 4 units 251-300: 6 units 301-350: 8 units 351-400: 10 units 400+: call MD 01/25/14  Yes Delfina Redwood, MD  insulin glargine (LANTUS) 100  UNIT/ML injection Inject 0.3 mLs (30 Units total) into the skin 2 (two) times daily. Takes 20 units every evening. Also takes 20 units in the morning only if sugar is over 200 01/25/14  Yes Delfina Redwood, MD  Liraglutide (VICTOZA) 18 MG/3ML SOPN Inject 0.6 mg into the skin daily.    Yes Historical Provider, MD  Oxycodone HCl 10 MG TABS Take 1 tablet (10 mg total) by mouth 3 (three) times daily as needed (pain). May fill not more than once every 30 days. 01/28/14  Yes Rowe Clack, MD  potassium chloride SA (K-DUR,KLOR-CON) 20 MEQ tablet Take 40 mEq by mouth 2 (two) times daily. 09/07/13  Yes Charlynne Cousins, MD  sertraline (ZOLOFT) 100 MG tablet Take 100 mg by mouth daily.   Yes Historical Provider, MD  spironolactone (ALDACTONE) 25 MG  tablet Take 1 tablet (25 mg total) by mouth 2 (two) times daily. 07/04/13  Yes Rowe Clack, MD  tiotropium (SPIRIVA) 18 MCG inhalation capsule Place 18 mcg into inhaler and inhale daily.   Yes Historical Provider, MD  tiZANidine (ZANAFLEX) 4 MG tablet Take 1 tablet (4 mg total) by mouth every 8 (eight) hours as needed for muscle spasms. 01/28/14  Yes Rowe Clack, MD  torsemide (DEMADEX) 100 MG tablet Take 100 mg by mouth 2 (two) times daily.   Yes Historical Provider, MD  varenicline (CHANTIX STARTING MONTH PAK) 0.5 MG X 11 & 1 MG X 42 tablet Take one 0.5 mg tablet by mouth once daily for 3 days, then increase to one 0.5 mg tablet twice daily for 4 days, then increase to one 1 mg tablet twice daily. 01/14/14  Yes Rowe Clack, MD   BP 102/61  Pulse 94  Temp(Src) 98.5 F (36.9 C) (Oral)  Resp 26  SpO2 97%  LMP 10/16/2012 Physical Exam  Nursing note and vitals reviewed. Constitutional: She is oriented to person, place, and time. She appears well-developed and well-nourished. No distress.  HENT:  Head: Normocephalic and atraumatic.  Mouth/Throat: Oropharynx is clear and moist.  Eyes: EOM are normal. Pupils are equal, round, and  reactive to light.  Neck: Normal range of motion. Neck supple.  Cardiovascular: Normal rate and regular rhythm.  Exam reveals no gallop and no friction rub.   No murmur heard. Pulmonary/Chest: Effort normal and breath sounds normal. No respiratory distress. She has no wheezes. She has no rales. She exhibits no tenderness.  Abdominal: Soft. Bowel sounds are normal. She exhibits no distension and no mass. There is no tenderness. There is no rebound and no guarding.  Musculoskeletal: Normal range of motion. She exhibits no edema and no tenderness.  Mild 1+ bilateral lower extremity swelling. No calf tenderness.  Neurological: She is alert and oriented to person, place, and time.  5/5 motor in all extremities. Sensation is grossly intact.  Skin: Skin is warm and dry. No rash noted. No erythema.  Psychiatric: She has a normal mood and affect. Her behavior is normal.    ED Course  Procedures (including critical care time) Labs Review Labs Reviewed  CBC WITH DIFFERENTIAL - Abnormal; Notable for the following:    RDW 19.3 (*)    Platelets 138 (*)    Neutrophils Relative % 80 (*)    Lymphocytes Relative 11 (*)    All other components within normal limits  URINALYSIS, ROUTINE W REFLEX MICROSCOPIC - Abnormal; Notable for the following:    Glucose, UA >1000 (*)    All other components within normal limits  CBG MONITORING, ED - Abnormal; Notable for the following:    Glucose-Capillary 520 (*)    All other components within normal limits  URINE MICROSCOPIC-ADD ON  COMPREHENSIVE METABOLIC PANEL  PRO B NATRIURETIC PEPTIDE  I-STAT TROPOININ, ED    Imaging Review No results found.   EKG Interpretation   Date/Time:  Wednesday Feb 11 2014 00:07:31 EDT Ventricular Rate:  94 PR Interval:  153 QRS Duration: 86 QT Interval:  358 QTC Calculation: 448 R Axis:   58 Text Interpretation:  Ectopic atrial rhythm ST elev, probable normal early  repol pattern Confirmed by Brodan Grewell  MD, Emmanuelle Hibbitts  ([id number]) on 02/11/2014  12:35:54 AM      MDM   Final diagnoses:  None    Patient's blood sugar improved in the emergency department with insulin and normal saline.  Vital signs remained stable. She is advised to followup with her primary Dr. Return precautions have been given.    Julianne Rice, MD 02/11/14 0157

## 2014-02-11 NOTE — Telephone Encounter (Signed)
[facility] Triage Call Report Triage Record Num: [id number] Operator: Lind Covert Patient Name: [name] Call Date & Time: 02/10/2014 10:41:57PM Patient Phone: [phone] PCP: [facility] Patient Gender: Female PCP Fax : Patient DOB: [birth year] Practice Name: [facility] Reason for Call: Caller: [name]/Patient; PCP: Gwendolyn Grant (Adults only); CB#: [phone]; Call regarding Diabetic/blood sugar 485; Pt's blood sugar is 485 02/10/14 at 2235, pt is having SOB upon exertion that started 02/09/14 but pt did not take blood sugar and last took it 02/08/14 and it was 159. Pt has been taking insulin as ordered, but was told to call if blood sugar is higher than 300. Pt is dizzy and "feels funny," but does not feel faint. Pt's mouth is very dry and blurred vision, but no nausea or vomiting. Triaged with Diabetes: Control Problems. Disposition: See ED Immediately for "Signs and symptoms of ketoacidosis AND blood sugar more than 300mg /dl." Care Advice given, pt verbalized understanding and stated her sister is with her and can drive her to the ED now. Suggested pt go to [facility] ED, pt agreed to go there now. Protocol(s) Used: Diabetes: Control Problems Recommended Outcome per Protocol: See ED Immediately Reason for Outcome: Signs and symptoms of ketoacidosis AND blood sugar more than 300 mg/dl Care Advice: ~ Another adult should drive. ~ IMMEDIATE ACTION 02/10/2014 10:54:15PM Page 1 of 1 [email or web address]

## 2014-02-11 NOTE — ED Notes (Signed)
I advised MD the patient refused tramadol because she said, "it doesn't work.  I take oxycodone three times a day for my back pain".  No orders given.

## 2014-02-11 NOTE — ED Notes (Signed)
Patient transported to X-ray 

## 2014-02-13 ENCOUNTER — Ambulatory Visit (INDEPENDENT_AMBULATORY_CARE_PROVIDER_SITE_OTHER): Payer: Medicare Other | Admitting: Adult Health

## 2014-02-13 ENCOUNTER — Encounter: Payer: Self-pay | Admitting: Adult Health

## 2014-02-13 VITALS — BP 100/60 | HR 89 | Temp 98.7°F | Ht 68.5 in | Wt 305.6 lb

## 2014-02-13 DIAGNOSIS — J441 Chronic obstructive pulmonary disease with (acute) exacerbation: Secondary | ICD-10-CM

## 2014-02-13 NOTE — Patient Instructions (Signed)
Begin Omnicef 300mg  Twice daily  For 5 days  Mucinex DM Twice daily As needed  Cough/congestion  Limit excessive fluid intake  Continue on BIPAP At bedtime   Oxygen 3l/m rest and 4 l/m with walking  Continue on low salt diet  Keep legs elevated.  Take Demadex and spironolactone as discussed.  Please contact office for sooner follow up if symptoms do not improve or worsen or seek emergency care  Follow up Dr. [name] in 3 weeks and As needed

## 2014-02-13 NOTE — Assessment & Plan Note (Addendum)
Exacerbation -slow to resolve  With tendency toward fluid overload -on max diuretic.  Will hold on steroids for now with recent high BS.  Plan  Begin Omnicef 300mg  Twice daily  For 5 days  Mucinex DM Twice daily As needed  Cough/congestion  Limit excessive fluid intake  Continue on BIPAP At bedtime   Oxygen 3l/m rest and 4 l/m with walking  Continue on low salt diet  Keep legs elevated.  Take Demadex and spironolactone as discussed.  Please contact office for sooner follow up if symptoms do not improve or worsen or seek emergency care  Follow up Dr. [name] in 3 weeks and As needed

## 2014-02-13 NOTE — Progress Notes (Signed)
Ov reviewed, and agree with plan as outlined.  

## 2014-02-13 NOTE — Progress Notes (Signed)
   Subjective:    [name], female    DOB: 1966-02-18, 48 y.o.   MRN: [id number]  HPI 48 yo female with known hx of COPD and obesity hypoventilation syndrome with chronic respiratory failure, as well as sleep apnea.  Known  diastolic dysfunction with a tendency toward fluid overload.    02/13/2014 Acute OV  Complains of increased SOB, wheezing, prod cough with light yellow/green mucus x2 weeks.   Reports quit smoking 3 weeks ago.  Denies any f/c/s, hemoptysis, nausea, vomiting.  Ankles swollen and wt up 7 lbs over last 2 weeks.  Recent admission 4/26 for COPD flare , tx w/ abx and steroids . Did have trouble with hyperglycemia .  CXR showed chronic changes  On BIPAP At bedtime  , 3 liters O2 24/7  Symbicort 2 puffs Twice daily  And spiriva   Was called in omnicef last week but did not take.   Review of Systems Constitutional:   No  weight loss, night sweats,  Fevers, chills, +fatigue, or  lassitude.  HEENT:   No headaches,  Difficulty swallowing,  Tooth/dental problems, or  Sore throat,                No sneezing, itching, ear ache, nasal congestion, post nasal drip,   CV:  No chest pain,  Orthopnea, PND, swelling in lower extremities, anasarca, dizziness, palpitations, syncope.   GI  No heartburn, indigestion, abdominal pain, nausea, vomiting, diarrhea, change in bowel habits, loss of appetite, bloody stools.   Resp: No shortness of breath with exertion or at rest.  No excess mucus, no productive cough,  No non-productive cough,  No coughing up of blood.  No change in color of mucus.  No wheezing.  No chest wall deformity  Skin: no rash or lesions.  GU: no dysuria, change in color of urine, no urgency or frequency.  No flank pain, no hematuria   MS:  No joint pain or swelling.  No decreased range of motion.  No back pain.  Psych:  No change in mood or affect. No depression or anxiety.  No memory loss.         Objective:   Physical Exam GEN:  A/Ox3; pleasant , NAD, well nourished   HEENT:  [place]/AT,  EACs-clear, TMs-wnl, NOSE-clear, THROAT-clear, no lesions, no postnasal drip or exudate noted.   NECK:  Supple w/ fair ROM; no JVD; normal carotid impulses w/o bruits; no thyromegaly or nodules palpated; no lymphadenopathy.  RESP  Clear  P & A; w/o, wheezes/ rales/ or rhonchi.no accessory muscle use, no dullness to percussion  CARD:  RRR, no m/r/g  , no peripheral edema, pulses intact, no cyanosis or clubbing.  GI:   Soft & nt; nml bowel sounds; no organomegaly or masses detected.  Musco: Warm bil, no deformities or joint swelling noted.   Neuro: alert, no focal deficits noted.    Skin: Warm, no lesions or rashes         Assessment & Plan:

## 2014-02-18 ENCOUNTER — Ambulatory Visit: Payer: Self-pay

## 2014-02-24 ENCOUNTER — Ambulatory Visit: Payer: Self-pay | Admitting: Internal Medicine

## 2014-02-25 ENCOUNTER — Ambulatory Visit
Admission: RE | Admit: 2014-02-25 | Discharge: 2014-02-25 | Disposition: A | Discharge status: Home / Self Care | Payer: Medicaid Other | Source: Ambulatory Visit | Attending: Internal Medicine | Admitting: Internal Medicine

## 2014-02-25 DIAGNOSIS — Z1239 Encounter for other screening for malignant neoplasm of breast: Secondary | ICD-10-CM

## 2014-03-01 ENCOUNTER — Other Ambulatory Visit: Payer: Self-pay | Admitting: Internal Medicine

## 2014-03-02 MED ORDER — OXYCODONE HCL 10 MG PO TABS: 10.0000 mg | ORAL_TABLET | Freq: Three times a day (TID) | ORAL | Status: DC | PRN

## 2014-03-02 MED ORDER — VARENICLINE TARTRATE 1 MG PO TABS: 1.0000 mg | ORAL_TABLET | Freq: Two times a day (BID) | ORAL | Status: DC

## 2014-03-05 ENCOUNTER — Other Ambulatory Visit: Payer: Self-pay | Admitting: Internal Medicine

## 2014-03-06 MED ORDER — TIZANIDINE HCL 4 MG PO TABS: 4.0000 mg | ORAL_TABLET | Freq: Three times a day (TID) | ORAL | Status: DC | PRN

## 2014-03-07 ENCOUNTER — Other Ambulatory Visit: Payer: Self-pay | Admitting: Internal Medicine

## 2014-03-08 ENCOUNTER — Other Ambulatory Visit: Payer: Self-pay | Admitting: Pulmonary Disease

## 2014-03-12 ENCOUNTER — Ambulatory Visit: Payer: Self-pay | Admitting: Pulmonary Disease

## 2014-03-13 ENCOUNTER — Ambulatory Visit: Payer: Self-pay | Admitting: Internal Medicine

## 2014-03-19 ENCOUNTER — Other Ambulatory Visit: Payer: Self-pay | Admitting: Internal Medicine

## 2014-03-29 ENCOUNTER — Other Ambulatory Visit: Payer: Self-pay | Admitting: Internal Medicine

## 2014-03-30 ENCOUNTER — Other Ambulatory Visit: Payer: Self-pay | Admitting: Internal Medicine

## 2014-03-30 MED ORDER — OXYCODONE HCL 10 MG PO TABS: 10.0000 mg | ORAL_TABLET | Freq: Three times a day (TID) | ORAL | Status: DC | PRN

## 2014-04-01 ENCOUNTER — Telehealth: Payer: Self-pay | Admitting: *Deleted

## 2014-04-01 ENCOUNTER — Other Ambulatory Visit: Payer: Self-pay | Admitting: Internal Medicine

## 2014-04-01 DIAGNOSIS — E1165 Type 2 diabetes mellitus with hyperglycemia: Secondary | ICD-10-CM

## 2014-04-01 DIAGNOSIS — IMO0002 Reserved for concepts with insufficient information to code with codable children: Secondary | ICD-10-CM

## 2014-04-01 NOTE — Telephone Encounter (Signed)
Patient has an appointment a1c bmet ordered Diabetic bundle

## 2014-04-02 ENCOUNTER — Other Ambulatory Visit: Payer: Self-pay | Admitting: Internal Medicine

## 2014-04-03 ENCOUNTER — Ambulatory Visit: Payer: Self-pay | Admitting: Pulmonary Disease

## 2014-04-06 ENCOUNTER — Encounter: Payer: Self-pay | Admitting: Pulmonary Disease

## 2014-04-06 ENCOUNTER — Ambulatory Visit (INDEPENDENT_AMBULATORY_CARE_PROVIDER_SITE_OTHER): Payer: Medicare Other | Admitting: Pulmonary Disease

## 2014-04-06 ENCOUNTER — Ambulatory Visit (INDEPENDENT_AMBULATORY_CARE_PROVIDER_SITE_OTHER): Payer: Medicare Other | Admitting: Internal Medicine

## 2014-04-06 ENCOUNTER — Encounter: Payer: Self-pay | Admitting: Internal Medicine

## 2014-04-06 ENCOUNTER — Other Ambulatory Visit (INDEPENDENT_AMBULATORY_CARE_PROVIDER_SITE_OTHER): Payer: Medicare Other

## 2014-04-06 VITALS — BP 120/68 | HR 87 | Temp 98.7°F | Ht 68.0 in | Wt 305.2 lb

## 2014-04-06 VITALS — BP 132/74 | HR 87 | Temp 98.2°F | Ht 68.5 in | Wt 303.0 lb

## 2014-04-06 DIAGNOSIS — E1165 Type 2 diabetes mellitus with hyperglycemia: Secondary | ICD-10-CM

## 2014-04-06 DIAGNOSIS — R7989 Other specified abnormal findings of blood chemistry: Secondary | ICD-10-CM

## 2014-04-06 DIAGNOSIS — IMO0002 Reserved for concepts with insufficient information to code with codable children: Secondary | ICD-10-CM

## 2014-04-06 DIAGNOSIS — J9620 Acute and chronic respiratory failure, unspecified whether with hypoxia or hypercapnia: Secondary | ICD-10-CM

## 2014-04-06 DIAGNOSIS — J9621 Acute and chronic respiratory failure with hypoxia: Secondary | ICD-10-CM

## 2014-04-06 DIAGNOSIS — R0902 Hypoxemia: Secondary | ICD-10-CM

## 2014-04-06 DIAGNOSIS — E662 Morbid (severe) obesity with alveolar hypoventilation: Secondary | ICD-10-CM

## 2014-04-06 DIAGNOSIS — J438 Other emphysema: Secondary | ICD-10-CM

## 2014-04-06 DIAGNOSIS — IMO0001 Reserved for inherently not codable concepts without codable children: Secondary | ICD-10-CM

## 2014-04-06 DIAGNOSIS — J439 Emphysema, unspecified: Secondary | ICD-10-CM

## 2014-04-06 DIAGNOSIS — G4733 Obstructive sleep apnea (adult) (pediatric): Secondary | ICD-10-CM

## 2014-04-06 LAB — LIPID PANEL
CHOL/HDL RATIO: 7
CHOLESTEROL: 262 mg/dL — AB (ref 0–200)
HDL: 38 mg/dL — AB (ref >39.00)
LDL CALC: 178 mg/dL — AB (ref 0–99)
NonHDL: 224
TRIGLYCERIDES: 229 mg/dL — AB (ref 0.0–149.0)
VLDL: 45.8 mg/dL — AB (ref 0.0–40.0)

## 2014-04-06 LAB — BASIC METABOLIC PANEL
BUN: 13 mg/dL (ref 6–23)
CHLORIDE: 100 meq/L (ref 96–112)
CO2: 27 meq/L (ref 19–32)
Calcium: 9.1 mg/dL (ref 8.4–10.5)
Creatinine, Ser: 1.2 mg/dL (ref 0.4–1.2)
GFR: 60.15 mL/min (ref >60.00)
Glucose, Bld: 187 mg/dL — ABNORMAL HIGH (ref 70–99)
POTASSIUM: 3.5 meq/L (ref 3.5–5.1)
SODIUM: 137 meq/L (ref 135–145)

## 2014-04-06 LAB — TSH: TSH: 2.96 u[IU]/mL (ref 0.35–4.50)

## 2014-04-06 LAB — MICROALBUMIN / CREATININE URINE RATIO
Creatinine,U: 70.5 mg/dL
Microalb Creat Ratio: 0.6 mg/g (ref 0.0–30.0)
Microalb, Ur: 0.4 mg/dL (ref 0.0–1.9)

## 2014-04-06 LAB — HEMOGLOBIN A1C: HEMOGLOBIN A1C: 8 % — AB (ref 4.6–6.5)

## 2014-04-06 MED ORDER — CLONAZEPAM 1 MG PO TABS: ORAL_TABLET | ORAL | Status: DC

## 2014-04-06 MED ORDER — INSULIN GLARGINE 100 UNIT/ML ~~LOC~~ SOLN: SUBCUTANEOUS | Status: DC

## 2014-04-06 NOTE — Progress Notes (Signed)
Pre visit review using our clinic review tool, if applicable. No additional management support is needed unless otherwise documented below in the visit note. 

## 2014-04-06 NOTE — Telephone Encounter (Signed)
Re[rinted script could not locate first one md pribted. Faxed script back to [facility]...Johny Chess

## 2014-04-06 NOTE — Assessment & Plan Note (Signed)
Acute exacerbation with COPD 06/2013, 07/2013 and 12/2013 - hospitalization for same reviewed - resolved to baseline Chronic hypoxic resp failure with COPD - continue bipap at home, on nasal mask since 10/14 hosp, but noncompliance with same reviewed baseline hypercarbia reviewed ongoing chronic inhalers (spiriva and symbicort) with prn nebulizer continue home oxygen and followup with pulmonary specialist as planned encouraged pt to resume efforts on cessation of smoking and encouraged never to smoke again!

## 2014-04-06 NOTE — Assessment & Plan Note (Signed)
The patient is having issues with her mask fit, humidity level, and is unsure if her pressure is adequate. Will have her home care company check her device, and show her how to use the heated humidifier. Will also refer her to the sleep Center for a formal mask fitting. Once this is done, we will get a 4 week download off her device to make sure that her sleep apnea is being adequately controlled. Finally, I have stressed to her the importance of aggressive weight loss.

## 2014-04-06 NOTE — Progress Notes (Signed)
   Subjective:    [name], female    DOB: [birth year], 48 y.o.   MRN: [id number]  HPI Patient comes in today for followup of her known COPD, as well as obstructive sleep apnea with obesity hypoventilation. Unfortunately, she continues to smoke a few cigarettes, but it V. she is staying on her bronchodilator regimen. She feels that her breathing is not getting any better, but she is also having ongoing issues with her weight. She is staying on her bilevel for her sleep apnea and OHS, but is having issues with her mask fit/leaking. She is unsure if the machine is working properly, and is having a lot of issues with dry nasal mucosa with epistaxis. She is putting water into the humidifier, but is not aware of how to adjust the heated in order to improve moisture level. She is also wearing oxygen during the day which can dry out her mucous membranes. She does not feel that she is very rested on a bilevel, and we will need to check her pressure.   Review of Systems  Constitutional: Negative for fever and unexpected weight change.  HENT: Positive for nosebleeds. Negative for congestion, dental problem, ear pain, postnasal drip, rhinorrhea, sinus pressure, sneezing, sore throat and trouble swallowing.   Eyes: Negative for redness and itching.  Respiratory: Positive for shortness of breath. Negative for cough, chest tightness and wheezing.   Cardiovascular: Negative for palpitations and leg swelling.  Gastrointestinal: Negative for nausea and vomiting.  Genitourinary: Negative for dysuria.  Musculoskeletal: Negative for joint swelling.  Skin: Negative for rash.  Neurological: Positive for headaches.  Hematological: Does not bruise/bleed easily.  Psychiatric/Behavioral: Negative for dysphoric mood. The patient is not nervous/anxious.        Objective:   Physical Exam Morbidly obese female in no acute distress Nose without purulence or discharge noted Neck without lymphadenopathy  or thyromegaly No skin breakdown or pressure necrosis from the CPAP mask Chest with mildly decreased breath sounds, no wheezing Cardiac exam with regular rate and rhythm Lower extremities with minimal edema, no cyanosis Alert and oriented, moves all 4 extremities.       Assessment & Plan:

## 2014-04-06 NOTE — Assessment & Plan Note (Signed)
New dx 11/2011 - exacerbated by frequent steroid use for pulm dz Intol of metformin due to severe diarrhea side effects  On Lantus pen BID, Novolog TID AC and Amaryl in AM - titrate as needed Declined to start low dose Victoza 10/2013 as rx'd also assist with patient's weight loss goals for controlling appetite? check a1c and labs and titrate as needed Lab Results  Component Value Date   HGBA1C 8.7* 01/19/2014

## 2014-04-06 NOTE — Progress Notes (Signed)
Subjective:    [name], female    DOB: Jan 19, 1966, 49 y.o.   MRN: [id number]  HPI  Patient is here for follow up  Reviewed chronic medical issues and interval medical events  Past Medical History  Diagnosis Date  . Hypertension   . Hyperlipidemia   . Chronic headache   . Fibromyalgia     daily narcotics  . Anxiety     hx chronic BZ use, stopped 07/2010  . Anemia   . Pulmonary sarcoidosis     unimpressive CT chest 2011  . Colonic polyp   . GERD (gastroesophageal reflux disease)   . ALLERGIC RHINITIS   . Asthma   . CHF (congestive heart failure)     Diastolic with fluid overload, May, 2012, LVEF 60%  . Morbid obesity   . Depression   . Panic attacks   . Diabetes mellitus   . COPD (chronic obstructive pulmonary disease)     on home O2, moderate airflow obstruction, suspect d/t emphysema  . Obesity   . Elevated LFTs 09/2011  . Ovarian cyst   . Chronic back pain   . Sleep apnea      CPAP    Review of Systems  Constitutional: Positive for fatigue. Negative for unexpected weight change.  Respiratory: Positive for shortness of breath (chronic). Negative for cough.   Cardiovascular: Negative for chest pain and leg swelling.  Psychiatric/Behavioral: Positive for sleep disturbance. Negative for suicidal ideas and self-injury. The patient is nervous/anxious.        Objective:   Physical Exam BP 132/74  Pulse 87  Temp(Src) 98.2 F (36.8 C) (Oral)  Ht 5' 8.5" (1.74 m)  Wt 303 lb (137.44 kg)  BMI 45.40 kg/m2  SpO2 93%  LMP 10/16/2012 Wt Readings from Last 3 Encounters:  04/06/14 303 lb (137.44 kg)  02/13/14 305 lb 9.6 oz (138.619 kg)  01/28/14 298 lb 6.4 oz (135.353 kg)   Constitutional: She is obese, chronic dyspnea at rest, wearing O2 -appears well-developed and well-nourished. No acute distress.  Neck: Normal range of motion. Neck supple. No JVD present. No thyromegaly present.  Cardiovascular: Distant but appears normal rate, regular  rhythm and normal heart sounds.  No murmur heard. No BLE edema. Pulmonary/Chest: Effort normal at rest and breath sounds diminished at bilateral bases. No respiratory distress. She has no wheezes or cracles.  Psychiatric: She has a normal mood and affect. Her behavior is normal. Judgment and thought content normal.   Lab Results  Component Value Date   WBC 6.7 02/10/2014   HGB 13.4 02/10/2014   HCT 40.6 02/10/2014   PLT 138* 02/10/2014   GLUCOSE 487* 02/10/2014   CHOL 304* 09/03/2013   TRIG 340.0* 09/03/2013   HDL 49.50 09/03/2013   LDLDIRECT 204.3 09/03/2013   LDLCALC  Value: 92        Total Cholesterol/HDL:CHD Risk Coronary Heart Disease Risk Table                     Men   Women  1/2 Average Risk   3.4   3.3  Average Risk       5.0   4.4  2 X Average Risk   9.6   7.1  3 X Average Risk  23.4   11.0        Use the calculated Patient Ratio above and the CHD Risk Table to determine the patient's CHD Risk.        ATP  III CLASSIFICATION (LDL):  <100     mg/dL   Optimal  100-129  mg/dL   Near or Above                    Optimal  130-159  mg/dL   Borderline  160-189  mg/dL   High  >190     mg/dL   Very High 03/07/2010   ALT 55* 02/10/2014   AST 31 02/10/2014   NA 128* 02/10/2014   K 4.3 02/10/2014   CL 89* 02/10/2014   CREATININE 0.98 02/10/2014   BUN 11 02/10/2014   CO2 23 02/10/2014   TSH 0.086* 01/22/2014   INR 0.96 08/03/2013   HGBA1C 8.7* 01/19/2014   MICROALBUR 0.5 09/03/2013    Mm Digital Screening Bilateral  03/05/2014   CLINICAL DATA:  Screening.  EXAM: DIGITAL SCREENING BILATERAL MAMMOGRAM WITH CAD  COMPARISON:  Previous exam(s)  ACR Breast Density Category a: The breast tissue is almost entirely fatty.  FINDINGS: There are no findings suspicious for malignancy. Images were processed with CAD.  IMPRESSION: No mammographic evidence of malignancy. A result letter of this screening mammogram will be mailed directly to the patient.  RECOMMENDATION: Screening mammogram in one year. (Code:[id number]-B-01Y)   BI-RADS CATEGORY  1: Negative.   Electronically Signed   By: Luberta Robertson M.D.   On: 03/05/2014 17:29       Assessment & Plan:   Problem List Items Addressed This Visit   Abnormal TSH      Sick euthyroid with normal T3 and T4 during hosp 12/2013 Recheck TSH inow  Lab Results  Component Value Date   TSH 0.086* 01/22/2014      Relevant Orders      TSH   Acute-on-chronic respiratory failure     Acute exacerbation with COPD 06/2013, 07/2013 and 12/2013 - hospitalization for same reviewed - resolved to baseline Chronic hypoxic resp failure with COPD - continue bipap at home, on nasal mask since 10/14 hosp, but noncompliance with same reviewed baseline hypercarbia reviewed ongoing chronic inhalers (spiriva and symbicort) with prn nebulizer continue home oxygen and followup with pulmonary specialist as planned encouraged pt to resume efforts on cessation of smoking and encouraged never to smoke again!    Diabetes type 2, uncontrolled - Primary      New dx 11/2011 - exacerbated by frequent steroid use for pulm dz Intol of metformin due to severe diarrhea side effects  On Lantus pen BID, Novolog TID AC and Amaryl in AM - titrate as needed Declined to start low dose Victoza 10/2013 as rx'd also assist with patient's weight loss goals for controlling appetite? check a1c and labs and titrate as needed Lab Results  Component Value Date   HGBA1C 8.7* 01/19/2014      Relevant Medications      insulin glargine (LANTUS) 100 UNIT/ML injection   Other Relevant Orders      Hemoglobin A1c      Basic metabolic panel      Lipid panel      Microalbumin / creatinine urine ratio

## 2014-04-06 NOTE — Assessment & Plan Note (Signed)
Sick euthyroid with normal T3 and T4 during hosp 12/2013 Recheck TSH inow  Lab Results  Component Value Date   TSH 0.086* 01/22/2014

## 2014-04-06 NOTE — Patient Instructions (Signed)
Stay on your breathing medications You must stop smoking 100%!! Will arrange for fitting session at the sleep center to work on better mask fit. Will have [employer] check your machine function, and to show you how to use the heated humidifier to increase moisture to your nose. Can use AYR nasal saline GEL to help moisten nose.  NEVER put any grease/petroleum based material in your nose.  followup with me again in 45mos.

## 2014-04-06 NOTE — Patient Instructions (Signed)
It was good to see you today.  We have reviewed your prior records including labs and tests today  Test(s) ordered today. Your results will be released to [facility] (or called to you) after review, usually within 72hours after test completion. If any changes need to be made, you will be notified at that same time.  Medications reviewed and updated, no changes recommended at this time. Refill on medication(s) as discussed today.  Continue to think about giving up cigarettes! Use nicotine gum, nicotine patches or electronic cigarettes. If you're interested in medication to help you quit, please call  - let me know how I can help!  Please schedule followup in 4 months, call sooner if problems.

## 2014-04-06 NOTE — Addendum Note (Signed)
Addended by: Earnstine Regal on: 04/06/2014 11:40 AM   Modules accepted: Orders

## 2014-04-06 NOTE — Assessment & Plan Note (Signed)
Patient is staying on her bronchodilator regimen, but unfortunately continues to smoke a few cigarettes. I have stressed to her the importance of 100% smoking cessation. I have asked her to continue on her current medications. There is nothing by exam today to suggest bronchospasm or acute flare.

## 2014-04-08 ENCOUNTER — Telehealth: Payer: Self-pay | Admitting: Internal Medicine

## 2014-04-08 ENCOUNTER — Encounter: Payer: Self-pay | Admitting: Internal Medicine

## 2014-04-08 NOTE — Telephone Encounter (Signed)
Patient received a controlled substance contract through [employer].  She does not have a way to print this out.  Does she need to come by to sign.  Please advise.  Thanks!

## 2014-04-08 NOTE — Telephone Encounter (Signed)
Answered pts questions and education/informed the meaning of the lab results.

## 2014-04-15 ENCOUNTER — Other Ambulatory Visit (HOSPITAL_BASED_OUTPATIENT_CLINIC_OR_DEPARTMENT_OTHER): Payer: Self-pay

## 2014-04-16 ENCOUNTER — Encounter (HOSPITAL_COMMUNITY): Payer: Self-pay | Admitting: Emergency Medicine

## 2014-04-16 ENCOUNTER — Other Ambulatory Visit: Payer: Self-pay | Admitting: Internal Medicine

## 2014-04-16 ENCOUNTER — Encounter: Payer: Self-pay | Admitting: Internal Medicine

## 2014-04-16 ENCOUNTER — Emergency Department (HOSPITAL_COMMUNITY)
Admission: EM | Admit: 2014-04-16 | Discharge: 2014-04-17 | Disposition: A | Discharge status: Home / Self Care | Payer: Medicare Other | Attending: Emergency Medicine | Admitting: Emergency Medicine

## 2014-04-16 DIAGNOSIS — J449 Chronic obstructive pulmonary disease, unspecified: Secondary | ICD-10-CM | POA: Insufficient documentation

## 2014-04-16 DIAGNOSIS — Z7982 Long term (current) use of aspirin: Secondary | ICD-10-CM | POA: Insufficient documentation

## 2014-04-16 DIAGNOSIS — R071 Chest pain on breathing: Secondary | ICD-10-CM | POA: Diagnosis not present

## 2014-04-16 DIAGNOSIS — I5030 Unspecified diastolic (congestive) heart failure: Secondary | ICD-10-CM | POA: Diagnosis not present

## 2014-04-16 DIAGNOSIS — Z794 Long term (current) use of insulin: Secondary | ICD-10-CM | POA: Insufficient documentation

## 2014-04-16 DIAGNOSIS — F3289 Other specified depressive episodes: Secondary | ICD-10-CM | POA: Diagnosis not present

## 2014-04-16 DIAGNOSIS — G8929 Other chronic pain: Secondary | ICD-10-CM | POA: Insufficient documentation

## 2014-04-16 DIAGNOSIS — Z860100 Personal history of colon polyps, unspecified: Secondary | ICD-10-CM | POA: Insufficient documentation

## 2014-04-16 DIAGNOSIS — R079 Chest pain, unspecified: Secondary | ICD-10-CM | POA: Diagnosis present

## 2014-04-16 DIAGNOSIS — Z91040 Latex allergy status: Secondary | ICD-10-CM | POA: Insufficient documentation

## 2014-04-16 DIAGNOSIS — Z87891 Personal history of nicotine dependence: Secondary | ICD-10-CM | POA: Diagnosis not present

## 2014-04-16 DIAGNOSIS — E119 Type 2 diabetes mellitus without complications: Secondary | ICD-10-CM | POA: Diagnosis not present

## 2014-04-16 DIAGNOSIS — I10 Essential (primary) hypertension: Secondary | ICD-10-CM | POA: Insufficient documentation

## 2014-04-16 DIAGNOSIS — K219 Gastro-esophageal reflux disease without esophagitis: Secondary | ICD-10-CM | POA: Insufficient documentation

## 2014-04-16 DIAGNOSIS — G4730 Sleep apnea, unspecified: Secondary | ICD-10-CM | POA: Insufficient documentation

## 2014-04-16 DIAGNOSIS — Z862 Personal history of diseases of the blood and blood-forming organs and certain disorders involving the immune mechanism: Secondary | ICD-10-CM | POA: Diagnosis not present

## 2014-04-16 DIAGNOSIS — R0789 Other chest pain: Secondary | ICD-10-CM

## 2014-04-16 DIAGNOSIS — F329 Major depressive disorder, single episode, unspecified: Secondary | ICD-10-CM | POA: Insufficient documentation

## 2014-04-16 DIAGNOSIS — I509 Heart failure, unspecified: Secondary | ICD-10-CM | POA: Diagnosis not present

## 2014-04-16 DIAGNOSIS — F410 Panic disorder [episodic paroxysmal anxiety] without agoraphobia: Secondary | ICD-10-CM | POA: Insufficient documentation

## 2014-04-16 DIAGNOSIS — Z79899 Other long term (current) drug therapy: Secondary | ICD-10-CM | POA: Diagnosis not present

## 2014-04-16 DIAGNOSIS — Z9981 Dependence on supplemental oxygen: Secondary | ICD-10-CM | POA: Diagnosis not present

## 2014-04-16 DIAGNOSIS — J4489 Other specified chronic obstructive pulmonary disease: Secondary | ICD-10-CM | POA: Insufficient documentation

## 2014-04-16 NOTE — ED Notes (Addendum)
Pt to ED via [facility] with c/o left chest pain and shortness of breath.  Pt has hx of COPD and is on home 02 at 4LPM.  Pt also st's she used her inhaler prior to coming to ED with no relief.  Pt st's she has felt achy all day.

## 2014-04-16 NOTE — Telephone Encounter (Signed)
Pt already has klonopin

## 2014-04-17 ENCOUNTER — Emergency Department (HOSPITAL_COMMUNITY): Payer: Medicare Other

## 2014-04-17 DIAGNOSIS — R071 Chest pain on breathing: Secondary | ICD-10-CM | POA: Diagnosis not present

## 2014-04-17 LAB — BASIC METABOLIC PANEL
Anion gap: 15 (ref 5–15)
BUN: 12 mg/dL (ref 6–23)
CHLORIDE: 96 meq/L (ref 96–112)
CO2: 27 mEq/L (ref 19–32)
Calcium: 8.6 mg/dL (ref 8.4–10.5)
Creatinine, Ser: 0.98 mg/dL (ref 0.50–1.10)
GFR calc Af Amer: 78 mL/min — ABNORMAL LOW (ref >90)
GFR calc non Af Amer: 68 mL/min — ABNORMAL LOW (ref >90)
GLUCOSE: 119 mg/dL — AB (ref 70–99)
Potassium: 3.5 mEq/L — ABNORMAL LOW (ref 3.7–5.3)
Sodium: 138 mEq/L (ref 137–147)

## 2014-04-17 LAB — CBC WITH DIFFERENTIAL/PLATELET
Basophils Absolute: 0 10*3/uL (ref 0.0–0.1)
Basophils Relative: 0 % (ref 0–1)
Eosinophils Absolute: 0.4 10*3/uL (ref 0.0–0.7)
Eosinophils Relative: 4 % (ref 0–5)
HEMATOCRIT: 40.7 % (ref 36.0–46.0)
HEMOGLOBIN: 13 g/dL (ref 12.0–15.0)
LYMPHS ABS: 1.3 10*3/uL (ref 0.7–4.0)
Lymphocytes Relative: 16 % (ref 12–46)
MCH: 25.8 pg — AB (ref 26.0–34.0)
MCHC: 31.9 g/dL (ref 30.0–36.0)
MCV: 80.9 fL (ref 78.0–100.0)
MONO ABS: 0.6 10*3/uL (ref 0.1–1.0)
MONOS PCT: 7 % (ref 3–12)
NEUTROS ABS: 6.2 10*3/uL (ref 1.7–7.7)
NEUTROS PCT: 73 % (ref 43–77)
Platelets: 230 10*3/uL (ref 150–400)
RBC: 5.03 MIL/uL (ref 3.87–5.11)
RDW: 14.9 % (ref 11.5–15.5)
WBC: 8.6 10*3/uL (ref 4.0–10.5)

## 2014-04-17 LAB — TROPONIN I: Troponin I: <0.3 ng/mL (ref <0.30)

## 2014-04-17 LAB — PRO B NATRIURETIC PEPTIDE: PRO B NATRI PEPTIDE: 44.7 pg/mL (ref 0–125)

## 2014-04-17 MED ORDER — GI COCKTAIL ~~LOC~~
30.0000 mL | Freq: Once | ORAL | Status: AC
  Administered 2014-04-17: 30 mL via ORAL
  Filled 2014-04-17: qty 30

## 2014-04-17 MED ORDER — OXYCODONE HCL 5 MG PO TABS
10.0000 mg | ORAL_TABLET | Freq: Once | ORAL | Status: AC
  Administered 2014-04-17: 10 mg via ORAL
  Filled 2014-04-17: qty 2

## 2014-04-17 MED ORDER — IPRATROPIUM-ALBUTEROL 0.5-2.5 (3) MG/3ML IN SOLN
3.0000 mL | Freq: Once | RESPIRATORY_TRACT | Status: AC
  Administered 2014-04-17: 3 mL via RESPIRATORY_TRACT
  Filled 2014-04-17: qty 3

## 2014-04-17 NOTE — ED Provider Notes (Signed)
CSN: [id number]     Arrival date & time 04/16/14  2346 History   First MD Initiated Contact with Patient 04/17/14 0019     Chief Complaint  Patient presents with  . Chest Pain     (Consider location/radiation/quality/duration/timing/severity/associated sxs/prior Treatment) HPI 48 year old female presents to emergency department from home via EMS with complaint of central and left-sided chest pain and shortness of breath.  Patient has COPD and is on 4 L, has chronic shortness of breath.  Patient initially reports over last few days her shortness of breath got worse, but in talking with her she has seen her pulmonologist and primary care Dr. within the last 2 weeks and had been complaining of shortness of breath for some time.  Patient reports she fell asleep on the couch, and woke with chest pain.  Patient had some mild nausea with onset of pain none now.  She denies any diaphoresis.  Patient has had similar pain in the past.  Patient is on chronic opiates for around reports that she did not take her evening dose.  Patient's been using her inhaler more recently, denies any change in her sputum or increased cough.  She reports she has quit smoking.  No leg swelling, no palpitations.  Patient denies previous history of heart attack or stents, but does have history of diastolic dysfunction for which she takes torsemide. Past Medical History  Diagnosis Date  . Hypertension   . Hyperlipidemia   . Chronic headache   . Fibromyalgia     daily narcotics  . Anxiety     hx chronic BZ use, stopped 07/2010  . Anemia   . Pulmonary sarcoidosis     unimpressive CT chest 2011  . Colonic polyp   . GERD (gastroesophageal reflux disease)   . ALLERGIC RHINITIS   . Asthma   . CHF (congestive heart failure)     Diastolic with fluid overload, May, 2012, LVEF 60%  . Morbid obesity   . Depression   . Panic attacks   . Diabetes mellitus   . COPD (chronic obstructive pulmonary disease)     on home O2, moderate  airflow obstruction, suspect d/t emphysema  . Obesity   . Elevated LFTs 09/2011  . Ovarian cyst   . Chronic back pain   . Sleep apnea      CPAP   Past Surgical History  Procedure Laterality Date  . Polypectomy  2011  . Lumbar microdiscectomy  07/06/2011    R L4-5, stern  . Back surgery    . Carpal tunnel release    . Steroid spinal injections    . Hysteroscopy w/d&c N/A 03/25/2013    Procedure: DILATATION AND CURETTAGE /HYSTEROSCOPY;  Surgeon: [name], MD;  Location: [facility];  Service: Gynecology;  Laterality: N/A;  . Dilation and curettage of uterus     Family History  Problem Relation Age of Onset  . Hypertension Mother   . Emphysema Father   . Hypertension Father   . Stomach cancer Father   . Allergies Brother   . Hypertension Brother   . Stomach cancer Brother   . Heart disease Mother   . Heart disease Father   . Heart disease Brother     died age 36 sudden death/MI   History  Substance Use Topics  . Smoking status: Former Smoker -- 0.25 packs/day for 29 years    Types: Cigarettes    Quit date: 01/18/2014  . Smokeless tobacco: Never Used     Comment:  Resumed smoking September 2013 --2-3 CIGS DAILY AND E-CIG  . Alcohol Use: Yes     Comment: occasionally   OB History   Grav Para Term Preterm Abortions TAB SAB Ect Mult Living                 Review of Systems   See History of Present Illness; otherwise all other systems are reviewed and negative  Allergies  Simvastatin; Sulfamethoxazole-trimethoprim; Zolpidem tartrate; Azithromycin; Doxycycline; Latex; Metformin and related; Metronidazole; Metolazone; and Ciprofloxacin  Home Medications   Prior to Admission medications   Medication Sig Start Date End Date Taking? Authorizing Provider  albuterol (PROVENTIL HFA;VENTOLIN HFA) 108 (90 BASE) MCG/ACT inhaler Inhale 2 puffs into the lungs every 6 (six) hours as needed for wheezing or shortness of breath.   Yes Historical Provider, MD  albuterol (PROVENTIL)  (2.5 MG/3ML) 0.083% nebulizer solution Take 2.5 mg by nebulization every 6 (six) hours as needed for wheezing.   Yes Historical Provider, MD  ALPRAZolam Duanne Moron) 1 MG tablet Take 1 mg by mouth 3 (three) times daily.   Yes Historical Provider, MD  aspirin EC 81 MG tablet Take 1 tablet (81 mg total) by mouth daily. 06/06/13  Yes Rowe Clack, MD  budesonide-formoterol (SYMBICORT) 160-4.5 MCG/ACT inhaler Inhale 2 puffs into the lungs 2 (two) times daily. 09/04/12  Yes Reyne Dumas, MD  cetirizine (ZYRTEC) 1 MG/ML syrup Take 10 mg by mouth at bedtime.   Yes Historical Provider, MD  clonazePAM (KLONOPIN) 1 MG tablet Take 0.5 mg by mouth 2 (two) times daily as needed for anxiety.   Yes Historical Provider, MD  esomeprazole (NEXIUM) 40 MG capsule Take 1 capsule (40 mg total) by mouth 2 (two) times daily. 10/02/13  Yes Rowe Clack, MD  fluticasone (FLONASE) 50 MCG/ACT nasal spray Place 2 sprays into both nostrils daily as needed for allergies or rhinitis.   Yes Historical Provider, MD  gabapentin (NEURONTIN) 300 MG capsule Take 300 mg by mouth at bedtime as needed (pain).  03/08/14  Yes Historical Provider, MD  glimepiride (AMARYL) 1 MG tablet Take 1 mg by mouth daily before breakfast.   Yes Historical Provider, MD  hydrOXYzine (ATARAX/VISTARIL) 10 MG tablet Take 1 tablet (10 mg total) by mouth 3 (three) times daily as needed for itching. 10/08/13  Yes Rowe Clack, MD  Insulin Glargine (LANTUS SOLOSTAR) 100 UNIT/ML Solostar Pen Inject 30 Units into the skin 2 (two) times daily.   Yes Historical Provider, MD  Liraglutide (VICTOZA) 18 MG/3ML SOPN Inject 0.6 Units into the skin daily.   Yes Historical Provider, MD  Oxycodone HCl 10 MG TABS Take 1 tablet (10 mg total) by mouth 3 (three) times daily as needed (pain). May fill not more than once every 30 days. 03/30/14  Yes Rowe Clack, MD  potassium chloride SA (K-DUR,KLOR-CON) 20 MEQ tablet Take 40 mEq by mouth 2 (two) times daily. 09/07/13  Yes  Charlynne Cousins, MD  promethazine (PHENERGAN) 25 MG tablet Take 25 mg by mouth every 6 (six) hours as needed for nausea or vomiting.   Yes Historical Provider, MD  sertraline (ZOLOFT) 100 MG tablet Take 100 mg by mouth daily.   Yes Historical Provider, MD  spironolactone (ALDACTONE) 25 MG tablet Take 1 tablet (25 mg total) by mouth 2 (two) times daily. 07/04/13  Yes Rowe Clack, MD  tiotropium (SPIRIVA) 18 MCG inhalation capsule Place 18 mcg into inhaler and inhale 2 (two) times daily.    Yes Historical  Provider, MD  tiZANidine (ZANAFLEX) 4 MG tablet Take 1 tablet (4 mg total) by mouth every 8 (eight) hours as needed for muscle spasms. 03/06/14  Yes Rowe Clack, MD  torsemide (DEMADEX) 100 MG tablet Take 100 mg by mouth 2 (two) times daily.   Yes Historical Provider, MD  varenicline (CHANTIX CONTINUING MONTH PAK) 1 MG tablet Take 1 tablet (1 mg total) by mouth 2 (two) times daily. 03/02/14  Yes Rowe Clack, MD   BP 92/53  Pulse 78  Temp(Src) 98.3 F (36.8 C) (Oral)  Resp 18  Ht 5\' 8"  (1.727 m)  Wt 300 lb (136.079 kg)  BMI 45.63 kg/m2  SpO2 100%  LMP 10/16/2012 Physical Exam  Nursing note and vitals reviewed. Constitutional: She is oriented to person, place, and time. She appears well-developed and well-nourished.  HENT:  Head: Normocephalic and atraumatic.  Nose: Nose normal.  Mouth/Throat: Oropharynx is clear and moist.  Eyes: Conjunctivae and EOM are normal. Pupils are equal, round, and reactive to light.  Neck: Normal range of motion. Neck supple. No JVD present. No tracheal deviation present. No thyromegaly present.  Cardiovascular: Normal rate, regular rhythm, normal heart sounds and intact distal pulses.  Exam reveals no gallop and no friction rub.   No murmur heard. Pulmonary/Chest: Effort normal and breath sounds normal. No stridor. No respiratory distress. She has no wheezes. She has no rales. She exhibits tenderness (patient is significant tenderness with  light palpation of her anterior chest.  She reports palpation reproduces her chest pain).  Prolonged expiratory phase noted but no wheezing noted.  No Rales.  Abdominal: Soft. Bowel sounds are normal. She exhibits no distension and no mass. There is no tenderness. There is no rebound and no guarding.  Musculoskeletal: Normal range of motion. She exhibits no edema and no tenderness.  Lymphadenopathy:    She has no cervical adenopathy.  Neurological: She is alert and oriented to person, place, and time. She exhibits normal muscle tone. Coordination normal.  Skin: Skin is dry. No rash noted. No erythema. No pallor.  Psychiatric: She has a normal mood and affect. Her behavior is normal. Judgment and thought content normal.    ED Course  Procedures (including critical care time) Labs Review Labs Reviewed  CBC WITH DIFFERENTIAL - Abnormal; Notable for the following:    MCH 25.8 (*)    All other components within normal limits  BASIC METABOLIC PANEL - Abnormal; Notable for the following:    Potassium 3.5 (*)    Glucose, Bld 119 (*)    GFR calc non Af Amer 68 (*)    GFR calc Af Amer 78 (*)    All other components within normal limits  TROPONIN I  PRO B NATRIURETIC PEPTIDE    Imaging Review Dg Chest 2 View  04/17/2014   CLINICAL DATA:  Shortness of breath with chest pain. History of COPD.  EXAM: CHEST  2 VIEW  COMPARISON:  02/11/2014 radiographs.  CT 01/19/2014.  FINDINGS: The heart size and mediastinal contours are stable. There is stable chronic lung disease with emphysema and bibasilar fibrotic changes. No superimposed airspace disease or significant pleural effusion is seen. There is no pneumothorax. The osseous structures appear unchanged.  IMPRESSION: Stable age advanced chronic lung disease with emphysema and basilar scarring. No acute superimposed process.   Electronically Signed   By: Camie Patience M.D.   On: 04/17/2014 01:58     EKG Interpretation   Date/Time:  Friday April 17 2014  00:02:00  EDT Ventricular Rate:  79 PR Interval:  156 QRS Duration: 89 QT Interval:  413 QTC Calculation: 473 R Axis:   54 Text Interpretation:  Ectopic atrial rhythm ST elev, probable normal early  repol pattern No significant change since last tracing Confirmed by Mckaylie Vasey   MD, Mililani Murthy ([id number]) on 04/17/2014 1:14:18 AM      MDM   Final diagnoses:  Chest wall pain    48 year old female with chest pain.  Workup thus far unremarkable.  Differential includes ACS, angina, chest wall pain, COPD PE.  Patient without ST changes on her EKG and has negative troponin.  No signs of CHF on chest x-ray or BNP.  Pain is reproducible with palpation.  Patient does not have any pleuritic type chest pain or tachycardia to indicate PE.  Plan for pain control and follow up with her provider Dr. and/or cardiologist for further workup.  Patient has had 2 negative troponins..  Her chest pain resolved with her normal dosing of oxycodone.  Plan to discharge home   Kalman Drape, MD 04/17/14 [phone]

## 2014-04-17 NOTE — ED Notes (Signed)
Pt states her pain is getting worse. Pt is referring to chest pain.

## 2014-04-17 NOTE — ED Notes (Signed)
Pt to xray at this time.

## 2014-04-17 NOTE — ED Notes (Signed)
Pt returned from xray

## 2014-04-17 NOTE — Discharge Instructions (Signed)
Chest Pain Observation It is often hard to give a specific diagnosis for the cause of chest pain. Among other possibilities your symptoms might be caused by inadequate oxygen delivery to your heart (angina). Angina that is not treated or evaluated can lead to a heart attack (myocardial infarction) or death. Blood tests, electrocardiograms, and X-rays may have been done to help determine a possible cause of your chest pain. After evaluation and observation, your health care provider has determined that it is unlikely your pain was caused by an unstable condition that requires hospitalization. However, a full evaluation of your pain may need to be completed, with additional diagnostic testing as directed. It is very important to keep your follow-up appointments. Not keeping your follow-up appointments could result in permanent heart damage, disability, or death. If there is any problem keeping your follow-up appointments, you must call your health care provider. HOME CARE INSTRUCTIONS  Due to the slight chance that your pain could be angina, it is important to follow your health care provider's treatment plan and also maintain a healthy lifestyle:  Maintain or work toward achieving a healthy weight.  Stay physically active and exercise regularly.  Decrease your salt intake.  Eat a balanced, healthy diet. Talk to a dietitian to learn about heart-healthy foods.  Increase your fiber intake by including whole grains, vegetables, fruits, and nuts in your diet.  Avoid situations that cause stress, anger, or depression.  Take medicines as advised by your health care provider. Report any side effects to your health care provider. Do not stop medicines or adjust the dosages on your own.  Quit smoking. Do not use nicotine patches or gum until you check with your health care provider.  Keep your blood pressure, blood sugar, and cholesterol levels within normal limits.  Limit alcohol intake to no more than  1 drink per day for women who are not pregnant and 2 drinks per day for men.  Do not abuse drugs. SEEK IMMEDIATE MEDICAL CARE IF: You have severe chest pain or pressure which may include symptoms such as:  You feel pain or pressure in your arms, neck, jaw, or back.  You have severe back or abdominal pain, feel sick to your stomach (nauseous), or throw up (vomit).  You are sweating profusely.  You are having a fast or irregular heartbeat.  You feel short of breath while at rest.  You notice increasing shortness of breath during rest, sleep, or with activity.  You have chest pain that does not get better after rest or after taking your usual medicine.  You wake from sleep with chest pain.  You are unable to sleep because you cannot breathe.  You develop a frequent cough or you are coughing up blood.  You feel dizzy, faint, or experience extreme fatigue.  You develop severe weakness, dizziness, fainting, or chills. Any of these symptoms may represent a serious problem that is an emergency. Do not wait to see if the symptoms will go away. Call your local emergency services (911 in the [place] Do not drive yourself to the hospital. MAKE SURE YOU:  Understand these instructions.  Will watch your condition.  Will get help right away if you are not doing well or get worse. Document Released: 10/14/2010 Document Revised: 09/16/2013 Document Reviewed: 03/13/2013 [facility] Patient Information 2015 [place], [place]. This information is not intended to replace advice given to you by your health care provider. Make sure you discuss any questions you have with your health care provider.  Chest Wall Pain Chest wall pain is pain felt in or around the chest bones and muscles. It may take up to 6 weeks to get better. It may take longer if you are active. Chest wall pain can happen on its own. Other times, things like germs, injury, coughing, or exercise can cause the pain. HOME CARE   Avoid  activities that make you tired or cause pain. Try not to use your chest, belly (abdominal), or side muscles. Do not use heavy weights.  Put ice on the sore area.  Put ice in a plastic bag.  Place a towel between your skin and the bag.  Leave the ice on for 15-20 minutes for the first 2 days.  Only take medicine as told by your doctor. GET HELP RIGHT AWAY IF:   You have more pain or are very uncomfortable.  You have a fever.  Your chest pain gets worse.  You have new problems.  You feel sick to your stomach (nauseous) or throw up (vomit).  You start to sweat or feel lightheaded.  You have a cough with mucus (phlegm).  You cough up blood. MAKE SURE YOU:   Understand these instructions.  Will watch your condition.  Will get help right away if you are not doing well or get worse. Document Released: 02/28/2008 Document Revised: 12/04/2011 Document Reviewed: 05/08/2011 [facility] Patient Information 2015 [place], [place]. This information is not intended to replace advice given to you by your health care provider. Make sure you discuss any questions you have with your health care provider.

## 2014-04-19 ENCOUNTER — Encounter: Payer: Self-pay | Admitting: Internal Medicine

## 2014-04-19 ENCOUNTER — Other Ambulatory Visit: Payer: Self-pay | Admitting: Internal Medicine

## 2014-04-20 MED ORDER — ALPRAZOLAM 1 MG PO TABS: 1.0000 mg | ORAL_TABLET | Freq: Three times a day (TID) | ORAL | Status: DC

## 2014-04-21 ENCOUNTER — Other Ambulatory Visit: Payer: Self-pay | Admitting: Internal Medicine

## 2014-04-28 ENCOUNTER — Telehealth: Payer: Self-pay | Admitting: Internal Medicine

## 2014-04-28 ENCOUNTER — Other Ambulatory Visit: Payer: Self-pay | Admitting: Internal Medicine

## 2014-04-28 ENCOUNTER — Encounter: Payer: Self-pay | Admitting: Internal Medicine

## 2014-04-29 ENCOUNTER — Encounter (HOSPITAL_COMMUNITY): Payer: Self-pay | Admitting: Emergency Medicine

## 2014-04-29 ENCOUNTER — Emergency Department (HOSPITAL_COMMUNITY): Payer: Medicare Other

## 2014-04-29 DIAGNOSIS — G4733 Obstructive sleep apnea (adult) (pediatric): Secondary | ICD-10-CM | POA: Diagnosis present

## 2014-04-29 DIAGNOSIS — E785 Hyperlipidemia, unspecified: Secondary | ICD-10-CM | POA: Diagnosis present

## 2014-04-29 DIAGNOSIS — D869 Sarcoidosis, unspecified: Secondary | ICD-10-CM | POA: Diagnosis present

## 2014-04-29 DIAGNOSIS — K219 Gastro-esophageal reflux disease without esophagitis: Secondary | ICD-10-CM | POA: Diagnosis present

## 2014-04-29 DIAGNOSIS — Z91040 Latex allergy status: Secondary | ICD-10-CM

## 2014-04-29 DIAGNOSIS — J45901 Unspecified asthma with (acute) exacerbation: Principal | ICD-10-CM

## 2014-04-29 DIAGNOSIS — E875 Hyperkalemia: Secondary | ICD-10-CM | POA: Diagnosis present

## 2014-04-29 DIAGNOSIS — Z9981 Dependence on supplemental oxygen: Secondary | ICD-10-CM

## 2014-04-29 DIAGNOSIS — E876 Hypokalemia: Secondary | ICD-10-CM | POA: Diagnosis present

## 2014-04-29 DIAGNOSIS — F410 Panic disorder [episodic paroxysmal anxiety] without agoraphobia: Secondary | ICD-10-CM | POA: Diagnosis present

## 2014-04-29 DIAGNOSIS — Z881 Allergy status to other antibiotic agents status: Secondary | ICD-10-CM

## 2014-04-29 DIAGNOSIS — E871 Hypo-osmolality and hyponatremia: Secondary | ICD-10-CM | POA: Diagnosis present

## 2014-04-29 DIAGNOSIS — Z883 Allergy status to other anti-infective agents status: Secondary | ICD-10-CM

## 2014-04-29 DIAGNOSIS — J441 Chronic obstructive pulmonary disease with (acute) exacerbation: Principal | ICD-10-CM | POA: Diagnosis present

## 2014-04-29 DIAGNOSIS — Z609 Problem related to social environment, unspecified: Secondary | ICD-10-CM

## 2014-04-29 DIAGNOSIS — Z888 Allergy status to other drugs, medicaments and biological substances status: Secondary | ICD-10-CM

## 2014-04-29 DIAGNOSIS — F332 Major depressive disorder, recurrent severe without psychotic features: Secondary | ICD-10-CM | POA: Diagnosis present

## 2014-04-29 DIAGNOSIS — E1165 Type 2 diabetes mellitus with hyperglycemia: Secondary | ICD-10-CM

## 2014-04-29 DIAGNOSIS — M549 Dorsalgia, unspecified: Secondary | ICD-10-CM | POA: Diagnosis present

## 2014-04-29 DIAGNOSIS — F22 Delusional disorders: Secondary | ICD-10-CM | POA: Diagnosis present

## 2014-04-29 DIAGNOSIS — J99 Respiratory disorders in diseases classified elsewhere: Secondary | ICD-10-CM | POA: Diagnosis present

## 2014-04-29 DIAGNOSIS — I509 Heart failure, unspecified: Secondary | ICD-10-CM | POA: Diagnosis present

## 2014-04-29 DIAGNOSIS — Z7982 Long term (current) use of aspirin: Secondary | ICD-10-CM

## 2014-04-29 DIAGNOSIS — F411 Generalized anxiety disorder: Secondary | ICD-10-CM | POA: Diagnosis present

## 2014-04-29 DIAGNOSIS — F4310 Post-traumatic stress disorder, unspecified: Secondary | ICD-10-CM | POA: Diagnosis present

## 2014-04-29 DIAGNOSIS — K5900 Constipation, unspecified: Secondary | ICD-10-CM | POA: Diagnosis present

## 2014-04-29 DIAGNOSIS — IMO0001 Reserved for inherently not codable concepts without codable children: Secondary | ICD-10-CM | POA: Diagnosis present

## 2014-04-29 DIAGNOSIS — F1021 Alcohol dependence, in remission: Secondary | ICD-10-CM | POA: Diagnosis present

## 2014-04-29 DIAGNOSIS — G8929 Other chronic pain: Secondary | ICD-10-CM | POA: Diagnosis present

## 2014-04-29 DIAGNOSIS — IMO0002 Reserved for concepts with insufficient information to code with codable children: Secondary | ICD-10-CM

## 2014-04-29 DIAGNOSIS — Z79899 Other long term (current) drug therapy: Secondary | ICD-10-CM

## 2014-04-29 DIAGNOSIS — Z6841 Body Mass Index (BMI) 40.0 and over, adult: Secondary | ICD-10-CM

## 2014-04-29 DIAGNOSIS — I10 Essential (primary) hypertension: Secondary | ICD-10-CM | POA: Diagnosis present

## 2014-04-29 DIAGNOSIS — R45850 Homicidal ideations: Secondary | ICD-10-CM

## 2014-04-29 DIAGNOSIS — Z794 Long term (current) use of insulin: Secondary | ICD-10-CM

## 2014-04-29 DIAGNOSIS — F17200 Nicotine dependence, unspecified, uncomplicated: Secondary | ICD-10-CM | POA: Diagnosis present

## 2014-04-29 DIAGNOSIS — F39 Unspecified mood [affective] disorder: Secondary | ICD-10-CM | POA: Diagnosis present

## 2014-04-29 DIAGNOSIS — I5032 Chronic diastolic (congestive) heart failure: Secondary | ICD-10-CM | POA: Diagnosis present

## 2014-04-29 MED ORDER — OXYCODONE HCL 10 MG PO TABS: 10.0000 mg | ORAL_TABLET | Freq: Three times a day (TID) | ORAL | Status: DC | PRN

## 2014-04-29 MED ORDER — ALBUTEROL SULFATE (2.5 MG/3ML) 0.083% IN NEBU
5.0000 mg | INHALATION_SOLUTION | Freq: Once | RESPIRATORY_TRACT | Status: AC
  Administered 2014-04-29: 5 mg via RESPIRATORY_TRACT
  Filled 2014-04-29: qty 6

## 2014-04-29 NOTE — ED Notes (Signed)
Pt. reports asthma attack with productive cough today after her power went off , occasional nausea and dizziness .

## 2014-04-29 NOTE — Telephone Encounter (Signed)
Left rx up front for pick-up...Johny Chess

## 2014-04-30 ENCOUNTER — Inpatient Hospital Stay (HOSPITAL_COMMUNITY)
Admission: EM | Admit: 2014-04-30 | Discharge: 2014-05-05 | DRG: 191 | Disposition: A | Discharge status: Home / Self Care | Payer: Medicare Other | Attending: Internal Medicine | Admitting: Internal Medicine

## 2014-04-30 DIAGNOSIS — R002 Palpitations: Secondary | ICD-10-CM

## 2014-04-30 DIAGNOSIS — Z881 Allergy status to other antibiotic agents status: Secondary | ICD-10-CM | POA: Diagnosis not present

## 2014-04-30 DIAGNOSIS — F411 Generalized anxiety disorder: Secondary | ICD-10-CM

## 2014-04-30 DIAGNOSIS — I10 Essential (primary) hypertension: Secondary | ICD-10-CM | POA: Diagnosis present

## 2014-04-30 DIAGNOSIS — R51 Headache: Secondary | ICD-10-CM

## 2014-04-30 DIAGNOSIS — E876 Hypokalemia: Secondary | ICD-10-CM | POA: Diagnosis present

## 2014-04-30 DIAGNOSIS — R9430 Abnormal result of cardiovascular function study, unspecified: Secondary | ICD-10-CM

## 2014-04-30 DIAGNOSIS — J45901 Unspecified asthma with (acute) exacerbation: Secondary | ICD-10-CM | POA: Diagnosis present

## 2014-04-30 DIAGNOSIS — J441 Chronic obstructive pulmonary disease with (acute) exacerbation: Secondary | ICD-10-CM | POA: Diagnosis present

## 2014-04-30 DIAGNOSIS — K5900 Constipation, unspecified: Secondary | ICD-10-CM | POA: Diagnosis present

## 2014-04-30 DIAGNOSIS — Z9981 Dependence on supplemental oxygen: Secondary | ICD-10-CM | POA: Diagnosis not present

## 2014-04-30 DIAGNOSIS — M5431 Sciatica, right side: Secondary | ICD-10-CM

## 2014-04-30 DIAGNOSIS — F17200 Nicotine dependence, unspecified, uncomplicated: Secondary | ICD-10-CM

## 2014-04-30 DIAGNOSIS — D869 Sarcoidosis, unspecified: Secondary | ICD-10-CM | POA: Diagnosis present

## 2014-04-30 DIAGNOSIS — I5033 Acute on chronic diastolic (congestive) heart failure: Secondary | ICD-10-CM

## 2014-04-30 DIAGNOSIS — K219 Gastro-esophageal reflux disease without esophagitis: Secondary | ICD-10-CM | POA: Diagnosis present

## 2014-04-30 DIAGNOSIS — E875 Hyperkalemia: Secondary | ICD-10-CM | POA: Diagnosis present

## 2014-04-30 DIAGNOSIS — J99 Respiratory disorders in diseases classified elsewhere: Secondary | ICD-10-CM | POA: Diagnosis present

## 2014-04-30 DIAGNOSIS — Z720 Tobacco use: Secondary | ICD-10-CM

## 2014-04-30 DIAGNOSIS — E785 Hyperlipidemia, unspecified: Secondary | ICD-10-CM | POA: Diagnosis present

## 2014-04-30 DIAGNOSIS — D649 Anemia, unspecified: Secondary | ICD-10-CM

## 2014-04-30 DIAGNOSIS — R45850 Homicidal ideations: Secondary | ICD-10-CM | POA: Diagnosis not present

## 2014-04-30 DIAGNOSIS — Z91040 Latex allergy status: Secondary | ICD-10-CM | POA: Diagnosis not present

## 2014-04-30 DIAGNOSIS — N939 Abnormal uterine and vaginal bleeding, unspecified: Secondary | ICD-10-CM

## 2014-04-30 DIAGNOSIS — F410 Panic disorder [episodic paroxysmal anxiety] without agoraphobia: Secondary | ICD-10-CM | POA: Diagnosis present

## 2014-04-30 DIAGNOSIS — IMO0002 Reserved for concepts with insufficient information to code with codable children: Secondary | ICD-10-CM | POA: Diagnosis not present

## 2014-04-30 DIAGNOSIS — F329 Major depressive disorder, single episode, unspecified: Secondary | ICD-10-CM

## 2014-04-30 DIAGNOSIS — F4310 Post-traumatic stress disorder, unspecified: Secondary | ICD-10-CM | POA: Diagnosis present

## 2014-04-30 DIAGNOSIS — IMO0001 Reserved for inherently not codable concepts without codable children: Secondary | ICD-10-CM | POA: Diagnosis present

## 2014-04-30 DIAGNOSIS — I509 Heart failure, unspecified: Secondary | ICD-10-CM | POA: Diagnosis present

## 2014-04-30 DIAGNOSIS — J309 Allergic rhinitis, unspecified: Secondary | ICD-10-CM

## 2014-04-30 DIAGNOSIS — G4733 Obstructive sleep apnea (adult) (pediatric): Secondary | ICD-10-CM

## 2014-04-30 DIAGNOSIS — E662 Morbid (severe) obesity with alveolar hypoventilation: Secondary | ICD-10-CM

## 2014-04-30 DIAGNOSIS — E871 Hypo-osmolality and hyponatremia: Secondary | ICD-10-CM | POA: Diagnosis present

## 2014-04-30 DIAGNOSIS — I5032 Chronic diastolic (congestive) heart failure: Secondary | ICD-10-CM

## 2014-04-30 DIAGNOSIS — Z79899 Other long term (current) drug therapy: Secondary | ICD-10-CM | POA: Diagnosis not present

## 2014-04-30 DIAGNOSIS — F331 Major depressive disorder, recurrent, moderate: Secondary | ICD-10-CM

## 2014-04-30 DIAGNOSIS — M25561 Pain in right knee: Secondary | ICD-10-CM

## 2014-04-30 DIAGNOSIS — Z7982 Long term (current) use of aspirin: Secondary | ICD-10-CM | POA: Diagnosis not present

## 2014-04-30 DIAGNOSIS — F39 Unspecified mood [affective] disorder: Secondary | ICD-10-CM | POA: Diagnosis present

## 2014-04-30 DIAGNOSIS — Z883 Allergy status to other anti-infective agents status: Secondary | ICD-10-CM | POA: Diagnosis not present

## 2014-04-30 DIAGNOSIS — Z609 Problem related to social environment, unspecified: Secondary | ICD-10-CM | POA: Diagnosis not present

## 2014-04-30 DIAGNOSIS — F1021 Alcohol dependence, in remission: Secondary | ICD-10-CM | POA: Diagnosis present

## 2014-04-30 DIAGNOSIS — Z794 Long term (current) use of insulin: Secondary | ICD-10-CM | POA: Diagnosis not present

## 2014-04-30 DIAGNOSIS — Z6841 Body Mass Index (BMI) 40.0 and over, adult: Secondary | ICD-10-CM | POA: Diagnosis not present

## 2014-04-30 DIAGNOSIS — F332 Major depressive disorder, recurrent severe without psychotic features: Secondary | ICD-10-CM | POA: Diagnosis present

## 2014-04-30 DIAGNOSIS — G8929 Other chronic pain: Secondary | ICD-10-CM | POA: Diagnosis present

## 2014-04-30 DIAGNOSIS — M549 Dorsalgia, unspecified: Secondary | ICD-10-CM | POA: Diagnosis present

## 2014-04-30 DIAGNOSIS — F22 Delusional disorders: Secondary | ICD-10-CM | POA: Diagnosis present

## 2014-04-30 DIAGNOSIS — R7989 Other specified abnormal findings of blood chemistry: Secondary | ICD-10-CM

## 2014-04-30 DIAGNOSIS — F32A Depression, unspecified: Secondary | ICD-10-CM

## 2014-04-30 DIAGNOSIS — Z888 Allergy status to other drugs, medicaments and biological substances status: Secondary | ICD-10-CM | POA: Diagnosis not present

## 2014-04-30 DIAGNOSIS — F4312 Post-traumatic stress disorder, chronic: Secondary | ICD-10-CM

## 2014-04-30 DIAGNOSIS — E1165 Type 2 diabetes mellitus with hyperglycemia: Secondary | ICD-10-CM

## 2014-04-30 LAB — BASIC METABOLIC PANEL
Anion gap: 15 (ref 5–15)
BUN: 16 mg/dL (ref 6–23)
CHLORIDE: 96 meq/L (ref 96–112)
CO2: 25 meq/L (ref 19–32)
Calcium: 8.2 mg/dL — ABNORMAL LOW (ref 8.4–10.5)
Creatinine, Ser: 1.11 mg/dL — ABNORMAL HIGH (ref 0.50–1.10)
GFR calc Af Amer: 67 mL/min — ABNORMAL LOW (ref >90)
GFR, EST NON AFRICAN AMERICAN: 58 mL/min — AB (ref >90)
GLUCOSE: 199 mg/dL — AB (ref 70–99)
Potassium: 3.7 mEq/L (ref 3.7–5.3)
SODIUM: 136 meq/L — AB (ref 137–147)

## 2014-04-30 LAB — URINALYSIS, ROUTINE W REFLEX MICROSCOPIC
Bilirubin Urine: NEGATIVE
Glucose, UA: 250 mg/dL — AB
HGB URINE DIPSTICK: NEGATIVE
Ketones, ur: NEGATIVE mg/dL
Leukocytes, UA: NEGATIVE
NITRITE: NEGATIVE
PH: 6 (ref 5.0–8.0)
Protein, ur: NEGATIVE mg/dL
Specific Gravity, Urine: 1.012 (ref 1.005–1.030)
Urobilinogen, UA: 0.2 mg/dL (ref 0.0–1.0)

## 2014-04-30 LAB — CBC
HCT: 41.8 % (ref 36.0–46.0)
HCT: 43.3 % (ref 36.0–46.0)
HEMOGLOBIN: 13.3 g/dL (ref 12.0–15.0)
Hemoglobin: 13.7 g/dL (ref 12.0–15.0)
MCH: 25.4 pg — ABNORMAL LOW (ref 26.0–34.0)
MCH: 25.5 pg — AB (ref 26.0–34.0)
MCHC: 31.8 g/dL (ref 30.0–36.0)
MCHC: 31.6 g/dL (ref 30.0–36.0)
MCV: 79.8 fL (ref 78.0–100.0)
MCV: 80.5 fL (ref 78.0–100.0)
PLATELETS: 223 10*3/uL (ref 150–400)
Platelets: 194 10*3/uL (ref 150–400)
RBC: 5.24 MIL/uL — AB (ref 3.87–5.11)
RBC: 5.38 MIL/uL — ABNORMAL HIGH (ref 3.87–5.11)
RDW: 15.3 % (ref 11.5–15.5)
RDW: 15.4 % (ref 11.5–15.5)
WBC: 7.4 10*3/uL (ref 4.0–10.5)
WBC: 8.3 10*3/uL (ref 4.0–10.5)

## 2014-04-30 LAB — CREATININE, SERUM
Creatinine, Ser: 1.13 mg/dL — ABNORMAL HIGH (ref 0.50–1.10)
GFR calc Af Amer: 66 mL/min — ABNORMAL LOW (ref >90)
GFR calc non Af Amer: 57 mL/min — ABNORMAL LOW (ref >90)

## 2014-04-30 LAB — GLUCOSE, CAPILLARY
GLUCOSE-CAPILLARY: 263 mg/dL — AB (ref 70–99)
GLUCOSE-CAPILLARY: 296 mg/dL — AB (ref 70–99)
Glucose-Capillary: 243 mg/dL — ABNORMAL HIGH (ref 70–99)
Glucose-Capillary: 299 mg/dL — ABNORMAL HIGH (ref 70–99)

## 2014-04-30 MED ORDER — METHYLPREDNISOLONE SODIUM SUCC 125 MG IJ SOLR
125.0000 mg | Freq: Four times a day (QID) | INTRAMUSCULAR | Status: AC
  Administered 2014-04-30 – 2014-05-01 (×4): 125 mg via INTRAVENOUS
  Filled 2014-04-30 (×5): qty 2

## 2014-04-30 MED ORDER — IPRATROPIUM-ALBUTEROL 0.5-2.5 (3) MG/3ML IN SOLN
3.0000 mL | RESPIRATORY_TRACT | Status: DC
  Administered 2014-04-30 (×5): 3 mL via RESPIRATORY_TRACT
  Filled 2014-04-30 (×6): qty 3

## 2014-04-30 MED ORDER — PANTOPRAZOLE SODIUM 40 MG PO TBEC
40.0000 mg | DELAYED_RELEASE_TABLET | Freq: Two times a day (BID) | ORAL | Status: DC
  Administered 2014-04-30 – 2014-05-05 (×11): 40 mg via ORAL
  Filled 2014-04-30 (×9): qty 1

## 2014-04-30 MED ORDER — ENOXAPARIN SODIUM 80 MG/0.8ML ~~LOC~~ SOLN
70.0000 mg | SUBCUTANEOUS | Status: DC
  Administered 2014-04-30 – 2014-05-05 (×6): 70 mg via SUBCUTANEOUS
  Filled 2014-04-30 (×7): qty 0.8

## 2014-04-30 MED ORDER — ENOXAPARIN SODIUM 40 MG/0.4ML ~~LOC~~ SOLN
40.0000 mg | SUBCUTANEOUS | Status: DC
  Filled 2014-04-30: qty 0.4

## 2014-04-30 MED ORDER — PROMETHAZINE HCL 25 MG PO TABS: 25.0000 mg | ORAL_TABLET | Freq: Four times a day (QID) | ORAL | Status: DC | PRN

## 2014-04-30 MED ORDER — INSULIN GLARGINE 100 UNIT/ML ~~LOC~~ SOLN
30.0000 [IU] | Freq: Two times a day (BID) | SUBCUTANEOUS | Status: DC
  Administered 2014-04-30 – 2014-05-05 (×11): 30 [IU] via SUBCUTANEOUS
  Filled 2014-04-30 (×12): qty 0.3

## 2014-04-30 MED ORDER — ASPIRIN EC 81 MG PO TBEC
81.0000 mg | DELAYED_RELEASE_TABLET | Freq: Every day | ORAL | Status: DC
  Administered 2014-04-30 – 2014-05-05 (×6): 81 mg via ORAL
  Filled 2014-04-30 (×6): qty 1

## 2014-04-30 MED ORDER — TIZANIDINE HCL 4 MG PO TABS
4.0000 mg | ORAL_TABLET | Freq: Three times a day (TID) | ORAL | Status: DC | PRN
  Administered 2014-05-05: 4 mg via ORAL
  Filled 2014-04-30 (×2): qty 1

## 2014-04-30 MED ORDER — FLUTICASONE PROPIONATE 50 MCG/ACT NA SUSP: 2.0000 | Freq: Every day | NASAL | Status: DC | PRN

## 2014-04-30 MED ORDER — GABAPENTIN 300 MG PO CAPS
300.0000 mg | ORAL_CAPSULE | Freq: Every evening | ORAL | Status: DC | PRN
  Filled 2014-04-30: qty 1

## 2014-04-30 MED ORDER — SODIUM CHLORIDE 0.9 % IV SOLN: INTRAVENOUS | Status: DC

## 2014-04-30 MED ORDER — ALPRAZOLAM 0.5 MG PO TABS
1.0000 mg | ORAL_TABLET | Freq: Three times a day (TID) | ORAL | Status: DC
  Administered 2014-04-30 – 2014-05-05 (×16): 1 mg via ORAL
  Filled 2014-04-30 (×17): qty 2

## 2014-04-30 MED ORDER — LORATADINE 10 MG PO TABS
10.0000 mg | ORAL_TABLET | Freq: Every day | ORAL | Status: DC
  Administered 2014-04-30 – 2014-05-05 (×6): 10 mg via ORAL
  Filled 2014-04-30 (×6): qty 1

## 2014-04-30 MED ORDER — ALBUTEROL SULFATE (2.5 MG/3ML) 0.083% IN NEBU
2.5000 mg | INHALATION_SOLUTION | Freq: Once | RESPIRATORY_TRACT | Status: DC
  Filled 2014-04-30: qty 3

## 2014-04-30 MED ORDER — OXYCODONE HCL 5 MG PO TABS
5.0000 mg | ORAL_TABLET | ORAL | Status: DC | PRN
  Administered 2014-04-30 – 2014-05-05 (×9): 5 mg via ORAL
  Filled 2014-04-30 (×11): qty 1

## 2014-04-30 MED ORDER — SODIUM CHLORIDE 0.9 % IJ SOLN
3.0000 mL | Freq: Two times a day (BID) | INTRAMUSCULAR | Status: DC
  Administered 2014-04-30 – 2014-05-05 (×10): 3 mL via INTRAVENOUS

## 2014-04-30 MED ORDER — HYDROMORPHONE HCL PF 1 MG/ML IJ SOLN
0.5000 mg | INTRAMUSCULAR | Status: DC | PRN
  Administered 2014-04-30 – 2014-05-05 (×19): 1 mg via INTRAVENOUS
  Filled 2014-04-30 (×20): qty 1

## 2014-04-30 MED ORDER — SPIRONOLACTONE 25 MG PO TABS
25.0000 mg | ORAL_TABLET | Freq: Two times a day (BID) | ORAL | Status: DC
  Administered 2014-04-30 – 2014-05-05 (×11): 25 mg via ORAL
  Filled 2014-04-30 (×13): qty 1

## 2014-04-30 MED ORDER — IPRATROPIUM BROMIDE 0.02 % IN SOLN
0.5000 mg | Freq: Once | RESPIRATORY_TRACT | Status: DC
  Filled 2014-04-30: qty 2.5

## 2014-04-30 MED ORDER — CETIRIZINE HCL 5 MG/5ML PO SYRP
10.0000 mg | ORAL_SOLUTION | Freq: Every day | ORAL | Status: DC
  Filled 2014-04-30: qty 10

## 2014-04-30 MED ORDER — SALINE SPRAY 0.65 % NA SOLN
1.0000 | NASAL | Status: DC | PRN
  Filled 2014-04-30: qty 44

## 2014-04-30 MED ORDER — IPRATROPIUM-ALBUTEROL 0.5-2.5 (3) MG/3ML IN SOLN
3.0000 mL | Freq: Once | RESPIRATORY_TRACT | Status: AC
  Administered 2014-04-30: 3 mL via RESPIRATORY_TRACT
  Filled 2014-04-30: qty 3

## 2014-04-30 MED ORDER — METHYLPREDNISOLONE SODIUM SUCC 125 MG IJ SOLR
125.0000 mg | Freq: Once | INTRAMUSCULAR | Status: AC
  Administered 2014-04-30: 125 mg via INTRAVENOUS
  Filled 2014-04-30: qty 2

## 2014-04-30 MED ORDER — SERTRALINE HCL 100 MG PO TABS
100.0000 mg | ORAL_TABLET | Freq: Every day | ORAL | Status: DC
  Administered 2014-04-30 – 2014-05-03 (×4): 100 mg via ORAL
  Filled 2014-04-30 (×4): qty 1

## 2014-04-30 MED ORDER — ONDANSETRON HCL 4 MG PO TABS
4.0000 mg | ORAL_TABLET | Freq: Four times a day (QID) | ORAL | Status: DC | PRN
  Administered 2014-04-30: 4 mg via ORAL
  Filled 2014-04-30: qty 1

## 2014-04-30 MED ORDER — HYDROXYZINE HCL 10 MG PO TABS
10.0000 mg | ORAL_TABLET | Freq: Three times a day (TID) | ORAL | Status: DC | PRN
  Filled 2014-04-30: qty 1

## 2014-04-30 MED ORDER — ONDANSETRON HCL 4 MG/2ML IJ SOLN
4.0000 mg | Freq: Once | INTRAMUSCULAR | Status: AC
  Administered 2014-04-30: 4 mg via INTRAVENOUS
  Filled 2014-04-30: qty 2

## 2014-04-30 MED ORDER — ALUM & MAG HYDROXIDE-SIMETH 200-200-20 MG/5ML PO SUSP: 30.0000 mL | Freq: Four times a day (QID) | ORAL | Status: DC | PRN

## 2014-04-30 MED ORDER — OXYCODONE HCL 10 MG PO TABS: 10.0000 mg | ORAL_TABLET | Freq: Three times a day (TID) | ORAL | Status: DC | PRN

## 2014-04-30 MED ORDER — ONDANSETRON HCL 4 MG/2ML IJ SOLN: 4.0000 mg | Freq: Four times a day (QID) | INTRAMUSCULAR | Status: DC | PRN

## 2014-04-30 MED ORDER — ALBUTEROL SULFATE (2.5 MG/3ML) 0.083% IN NEBU
5.0000 mg | INHALATION_SOLUTION | Freq: Once | RESPIRATORY_TRACT | Status: AC
  Administered 2014-04-30: 5 mg via RESPIRATORY_TRACT
  Filled 2014-04-30: qty 6

## 2014-04-30 MED ORDER — SENNA 8.6 MG PO TABS
2.0000 | ORAL_TABLET | Freq: Every day | ORAL | Status: DC
  Administered 2014-04-30 – 2014-05-04 (×5): 17.2 mg via ORAL
  Filled 2014-04-30 (×6): qty 2

## 2014-04-30 MED ORDER — IPRATROPIUM BROMIDE 0.02 % IN SOLN
0.5000 mg | Freq: Once | RESPIRATORY_TRACT | Status: AC
  Administered 2014-04-30: 0.5 mg via RESPIRATORY_TRACT
  Filled 2014-04-30: qty 2.5

## 2014-04-30 MED ORDER — TORSEMIDE 100 MG PO TABS
100.0000 mg | ORAL_TABLET | Freq: Two times a day (BID) | ORAL | Status: DC
  Administered 2014-04-30 – 2014-05-05 (×11): 100 mg via ORAL
  Filled 2014-04-30 (×14): qty 1

## 2014-04-30 MED ORDER — NICOTINE 14 MG/24HR TD PT24
14.0000 mg | MEDICATED_PATCH | Freq: Every day | TRANSDERMAL | Status: DC
  Administered 2014-04-30 – 2014-05-05 (×6): 14 mg via TRANSDERMAL
  Filled 2014-04-30 (×6): qty 1

## 2014-04-30 MED ORDER — BISACODYL 5 MG PO TBEC
10.0000 mg | DELAYED_RELEASE_TABLET | Freq: Every day | ORAL | Status: DC | PRN
  Administered 2014-05-02 – 2014-05-03 (×2): 10 mg via ORAL
  Filled 2014-04-30 (×2): qty 2

## 2014-04-30 MED ORDER — MAGNESIUM SULFATE 40 MG/ML IJ SOLN
2.0000 g | Freq: Once | INTRAMUSCULAR | Status: AC
  Administered 2014-04-30: 2 g via INTRAVENOUS
  Filled 2014-04-30: qty 50

## 2014-04-30 MED ORDER — POTASSIUM CHLORIDE CRYS ER 20 MEQ PO TBCR
40.0000 meq | EXTENDED_RELEASE_TABLET | Freq: Two times a day (BID) | ORAL | Status: DC
  Administered 2014-04-30 – 2014-05-01 (×3): 40 meq via ORAL
  Filled 2014-04-30 (×4): qty 2

## 2014-04-30 MED ORDER — LIRAGLUTIDE 18 MG/3ML ~~LOC~~ SOPN: 0.6000 [IU] | PEN_INJECTOR | Freq: Every day | SUBCUTANEOUS | Status: DC

## 2014-04-30 MED ORDER — ACETAMINOPHEN 325 MG PO TABS
650.0000 mg | ORAL_TABLET | Freq: Four times a day (QID) | ORAL | Status: DC | PRN
  Administered 2014-05-03: 650 mg via ORAL
  Filled 2014-04-30 (×2): qty 2

## 2014-04-30 MED ORDER — GLIMEPIRIDE 1 MG PO TABS
1.0000 mg | ORAL_TABLET | Freq: Every day | ORAL | Status: DC
  Administered 2014-04-30 – 2014-05-05 (×6): 1 mg via ORAL
  Filled 2014-04-30 (×7): qty 1

## 2014-04-30 MED ORDER — ACETAMINOPHEN 650 MG RE SUPP: 650.0000 mg | Freq: Four times a day (QID) | RECTAL | Status: DC | PRN

## 2014-04-30 NOTE — Progress Notes (Signed)
UR completed Suanne Minahan K. Ethelyne Erich, RN, BSN, [place], CCM  04/30/2014 2:27 PM

## 2014-04-30 NOTE — Progress Notes (Signed)
Inpatient Diabetes Program Recommendations  AACE/ADA: New Consensus Statement on Inpatient Glycemic Control (2013)  Target Ranges:  Prepandial:   less than 140 mg/dL      Peak postprandial:   less than 180 mg/dL (1-2 hours)      Critically ill patients:  140 - 180 mg/dL   Reason for Assessment:  Results for Yvette, Anderson (MRN [id number]) as of 04/30/2014 11:59  Ref. Range 04/30/2014 06:12  Glucose-Capillary Latest Range: 70-99 mg/dL 243 (H)   Diabetes history: Type 2 diabetes Outpatient Diabetes medications: Amaryl 1 mg daily, Lantus 30 units bid, Victoza 0.6 units daily Current orders for Inpatient glycemic control:  Amaryl 1 mg daily, Lantus 30 units bid, Victoza 0.6 units daily  Please add Novolog resistant tid with meals and HS.   Thanks, Adah Perl, RN, BC-ADM Inpatient Diabetes Coordinator Pager [phone]

## 2014-04-30 NOTE — Progress Notes (Signed)
Placed patient on CPAP with nasal mask, auto mode 20/5 with 2L bled in. Patient tolerating well at this time.

## 2014-04-30 NOTE — ED Notes (Signed)
Attempted to call report x1, asked by secretary if RN taking pt could call back. Left direct number

## 2014-04-30 NOTE — Progress Notes (Signed)
[facility] PROGRESS NOTE  [name] MCE:[id number] DOB: 06/15/1966 DOA: 04/30/2014 PCP: Gwendolyn Grant, MD I have seen and examined pt who is a 48 yo admitted this am by Dr Arnoldo Morale with history of COPD, hypertension, diabetes, pulmonary sarcoidosis presenting with worsening shortness of breath and wheezing on exam found to have COPD exacerbation. She states breathing better but still some chest tightness. We'll continue current management plan as per Dr. Arnoldo Morale. She is also complaining of nausea>> will obtain UA, placed on PPI, follow and further manage accordingly.     [place] [facility] Pager [phone]. If 7PM-7AM, please contact night-coverage at [email or web address], password [email or web address] 04/30/2014, 12:43 PM  LOS: 0 days

## 2014-04-30 NOTE — ED Notes (Signed)
States she's constipated, has been using sweets suppositories and laxatives with minimal results

## 2014-04-30 NOTE — H&P (Signed)
[facility] Admission History and Physical       [name] WYO:[id number] DOB: 07/23/1966 DOA: 04/30/2014  Referring physician:  PCP: Gwendolyn Grant, MD  Specialists:   Chief Complaint: SOB and Wheezing  HPI: [name] is a 48 y.o. female with a history of COPD, HTN, IDDM, Pulmonary Sarcoidosis who presents to the ED with complaints of worsening SOB and Wheezing with chest tightness over the past 3 days.  She reports denies having any chest pain or fevers or chills.   She was treated with multiple nebulizer treatements and iv solumedrol with minimal improvement in the ED, and a chest X-Ray was done revealing COPD changes and bibasilar scarring but no acute disease changes, and she  Was referred for medical admission.       Review of Systems:  Constitutional: No Weight Loss, No Weight Gain, Night Sweats, Fevers, Chills, Dizziness, Fatigue, or Generalized Weakness HEENT: No Headaches, Difficulty Swallowing,Tooth/Dental Problems,Sore Throat,  No Sneezing, Rhinitis, Ear Ache, Nasal Congestion, or Post Nasal Drip,  Cardio-vascular:  No Chest pain, Orthopnea, PND, Edema in Lower Extremities, Anasarca, Dizziness, Palpitations  Resp: +Dyspnea, No DOE, No Productive Cough, No Non-Productive Cough, No Hemoptysis,  +Wheezing.    GI: No Heartburn, Indigestion, Abdominal Pain, +Nausea, Vomiting, Diarrhea, Hematemesis, Hematochezia, Melena, +Constipation,  Loss of Appetite  GU: No Dysuria, Change in Color of Urine, No Urgency or Frequency, No Flank pain.  Musculoskeletal: No Joint Pain or Swelling, No Decreased Range of Motion, No Back Pain.  Neurologic: No Syncope, No Seizures, Muscle Weakness, Paresthesia, Vision Disturbance or Loss, No Diplopia, No Vertigo, No Difficulty Walking,  Skin: No Rash or Lesions. Psych: No Change in Mood or Affect, No Depression or Anxiety, No Memory loss, No Confusion, or Hallucinations   Past Medical History  Diagnosis Date  . Hypertension     . Hyperlipidemia   . Chronic headache   . Fibromyalgia     daily narcotics  . Anxiety     hx chronic BZ use, stopped 07/2010  . Anemia   . Pulmonary sarcoidosis     unimpressive CT chest 2011  . Colonic polyp   . GERD (gastroesophageal reflux disease)   . ALLERGIC RHINITIS   . Asthma   . CHF (congestive heart failure)     Diastolic with fluid overload, May, 2012, LVEF 60%  . Morbid obesity   . Depression   . Panic attacks   . Diabetes mellitus   . COPD (chronic obstructive pulmonary disease)     on home O2, moderate airflow obstruction, suspect d/t emphysema  . Obesity   . Elevated LFTs 09/2011  . Ovarian cyst   . Chronic back pain   . Sleep apnea      CPAP      Past Surgical History  Procedure Laterality Date  . Polypectomy  2011  . Lumbar microdiscectomy  07/06/2011    R L4-5, stern  . Back surgery    . Carpal tunnel release    . Steroid spinal injections    . Hysteroscopy w/d&c N/A 03/25/2013    Procedure: DILATATION AND CURETTAGE /HYSTEROSCOPY;  Surgeon: [name], MD;  Location: [facility];  Service: Gynecology;  Laterality: N/A;  . Dilation and curettage of uterus         Prior to Admission medications   Medication Sig Start Date End Date Taking? Authorizing Provider  albuterol (PROVENTIL HFA;VENTOLIN HFA) 108 (90 BASE) MCG/ACT inhaler Inhale 2 puffs into the lungs every 6 (six) hours as needed  for wheezing or shortness of breath.   Yes Historical Provider, MD  albuterol (PROVENTIL) (2.5 MG/3ML) 0.083% nebulizer solution Take 2.5 mg by nebulization every 6 (six) hours as needed for wheezing.   Yes Historical Provider, MD  ALPRAZolam Duanne Moron) 1 MG tablet Take 1 mg by mouth 3 (three) times daily.   Yes Historical Provider, MD  aspirin EC 81 MG tablet Take 81 mg by mouth daily.   Yes Historical Provider, MD  budesonide-formoterol (SYMBICORT) 160-4.5 MCG/ACT inhaler Inhale 2 puffs into the lungs 2 (two) times daily.   Yes Historical Provider, MD  cetirizine  (ZYRTEC) 1 MG/ML syrup Take 10 mg by mouth at bedtime.   Yes Historical Provider, MD  esomeprazole (NEXIUM) 40 MG capsule Take 40 mg by mouth 2 (two) times daily before a meal.   Yes Historical Provider, MD  fluticasone (FLONASE) 50 MCG/ACT nasal spray Place 2 sprays into both nostrils daily as needed for allergies or rhinitis.   Yes Historical Provider, MD  gabapentin (NEURONTIN) 300 MG capsule Take 300 mg by mouth at bedtime as needed (pain).  03/08/14  Yes Historical Provider, MD  glimepiride (AMARYL) 1 MG tablet Take 1 mg by mouth daily before breakfast.   Yes Historical Provider, MD  hydrOXYzine (ATARAX/VISTARIL) 10 MG tablet Take 10 mg by mouth 3 (three) times daily as needed for itching.   Yes Historical Provider, MD  Insulin Glargine (LANTUS SOLOSTAR) 100 UNIT/ML Solostar Pen Inject 30 Units into the skin 2 (two) times daily.   Yes Historical Provider, MD  Liraglutide (VICTOZA) 18 MG/3ML SOPN Inject 0.6 Units into the skin daily.   Yes Historical Provider, MD  nicotine (NICODERM CQ - DOSED IN MG/24 HOURS) 14 mg/24hr patch Place 14 mg onto the skin daily.   Yes Historical Provider, MD  Oxycodone HCl 10 MG TABS Take 10 mg by mouth 3 (three) times daily as needed (severe pain).   Yes Historical Provider, MD  potassium chloride SA (K-DUR,KLOR-CON) 20 MEQ tablet Take 40 mEq by mouth 2 (two) times daily. 09/07/13  Yes Charlynne Cousins, MD  promethazine (PHENERGAN) 25 MG tablet Take 25 mg by mouth every 6 (six) hours as needed for nausea or vomiting.   Yes Historical Provider, MD  sertraline (ZOLOFT) 100 MG tablet Take 100 mg by mouth daily.   Yes Historical Provider, MD  spironolactone (ALDACTONE) 25 MG tablet Take 25 mg by mouth 2 (two) times daily.   Yes Historical Provider, MD  tiotropium (SPIRIVA) 18 MCG inhalation capsule Place 18 mcg into inhaler and inhale 2 (two) times daily.    Yes Historical Provider, MD  tiZANidine (ZANAFLEX) 4 MG tablet Take 4 mg by mouth every 8 (eight) hours as  needed for muscle spasms.   Yes Historical Provider, MD  torsemide (DEMADEX) 100 MG tablet Take 100 mg by mouth 2 (two) times daily.   Yes Historical Provider, MD  varenicline (CHANTIX CONTINUING MONTH PAK) 1 MG tablet Take 1 tablet (1 mg total) by mouth 2 (two) times daily. 03/02/14   Rowe Clack, MD      Allergies  Allergen Reactions  . Simvastatin Other (See Comments)    Severe leg pain and burning  . Sulfamethoxazole-Trimethoprim Nausea And Vomiting    Causes Projectile Vomiting  . Zolpidem Tartrate Other (See Comments)    Chest pain  . Azithromycin Other (See Comments)    Resistant to med  . Doxycycline Other (See Comments)    Resistant to med  . Latex Itching and Rash  Also burning sensations  . Metformin And Related Diarrhea    Severe diarrhea  . Metronidazole Nausea And Vomiting  . Metolazone Other (See Comments)    Cramps,pain  . Ciprofloxacin Nausea Only     Social History:  reports that she quit smoking about 3 months ago. Her smoking use included Cigarettes. She has a 7.25 pack-year smoking history. She has never used smokeless tobacco. She reports that she drinks alcohol. She reports that she uses illicit drugs (Cocaine and Marijuana).     Family History  Problem Relation Age of Onset  . Hypertension Mother   . Emphysema Father   . Hypertension Father   . Stomach cancer Father   . Allergies Brother   . Hypertension Brother   . Stomach cancer Brother   . Heart disease Mother   . Heart disease Father   . Heart disease Brother     died age 53 sudden death/MI       Physical Exam:  GEN:  Pleasant Morbidly Obese  48 y.o. African American female examined  and in no acute distress; cooperative with exam Filed Vitals:   04/30/14 0345 04/30/14 0415 04/30/14 0440 04/30/14 0445  BP: 114/51 103/55 104/60 113/55  Pulse: 93 93 88 90  Temp:   98.4 F (36.9 C)   TempSrc:   Oral   Resp:   18   Height:      Weight:      SpO2:  95% 97%    Blood pressure  113/55, pulse 90, temperature 98.4 F (36.9 C), temperature source Oral, resp. rate 18, height 5\' 8"  (1.727 m), weight 136.079 kg (300 lb), last menstrual period 10/16/2012, SpO2 97.00%. PSYCH: She is alert and oriented x4; does not appear anxious does not appear depressed; affect is normal HEENT: Normocephalic and Atraumatic, Mucous membranes pink; PERRLA; EOM intact; Fundi:  Benign;  No scleral icterus, Nares: Patent, Oropharynx: Clear, Fair Dentition,    Neck:  FROM, No Cervical Lymphadenopathy nor Thyromegaly or Carotid Bruit; No JVD; Breasts:: Not examined CHEST WALL: No tenderness CHEST: Decreased Breath Sounds.  +expiratory wheezes,  No Rhonchi, No Rales    HEART: Regular rate and rhythm; no murmurs rubs or gallops BACK: No kyphosis or scoliosis; No CVA tenderness ABDOMEN: Positive Bowel Sounds, Obese, Soft Non-Tender; No Masses, No Organomegaly Rectal Exam: Not done EXTREMITIES: No Cyanosis, Clubbing, or Edema; No Ulcerations. Genitalia: not examined PULSES: 2+ and symmetric SKIN: Normal hydration no rash or ulceration CNS: Alert and Oriented x 4, No Focal Deficits Vascular: pulses palpable throughout    Labs on Admission:  Basic Metabolic Panel:  Recent Labs Lab 04/30/14 0059  NA 136*  K 3.7  CL 96  CO2 25  GLUCOSE 199*  BUN 16  CREATININE 1.11*  CALCIUM 8.2*   Liver Function Tests: No results found for this basename: AST, ALT, ALKPHOS, BILITOT, PROT, ALBUMIN,  in the last 168 hours No results found for this basename: LIPASE, AMYLASE,  in the last 168 hours No results found for this basename: AMMONIA,  in the last 168 hours CBC:  Recent Labs Lab 04/30/14 0059  WBC 7.4  HGB 13.3  HCT 41.8  MCV 79.8  PLT 194   Cardiac Enzymes: No results found for this basename: CKTOTAL, CKMB, CKMBINDEX, TROPONINI,  in the last 168 hours  BNP (last 3 results)  Recent Labs  01/18/14 2034 02/11/14 0021 04/17/14 0120  PROBNP 802.5* 32.8 44.7   CBG: No results found  for this basename: GLUCAP,  in  the last 168 hours  Radiological Exams on Admission: Dg Chest 2 View  04/29/2014   CLINICAL DATA:  Asthma and cough.  EXAM: CHEST  2 VIEW  COMPARISON:  04/17/2014  FINDINGS: Borderline cardiomegaly. The hila are prominent due to enlargement of the pulmonary arterial tree. There is chronic interstitial coarsening, especially at the bases. Recent characterization by CT 01/19/2014. No evidence of superimposed edema or pneumonia. No effusion or pneumothorax.  IMPRESSION: COPD with bibasilar scarring. No evidence of acute superimposed disease.   Electronically Signed   By: Jorje Guild M.D.   On: 04/29/2014 22:02     EKG: Independently reviewed.    Assessment/Plan:   48 y.o. female with  Principal Problem:   1.   COPD exacerbation   IV Steroid Taper   DUONebs   O2 RPN   Monitor O2 sats  Active Problems:   2.   PULMONARY SARCOIDOSIS   stable    3.   HYPERTENSION   On Demadex, and Aldactone rx   Monitor BPs.      4.   OBSTRUCTIVE SLEEP APNEA   CPAP qhs    5.   Diabetes type 2, uncontrolled   Continue  Lantus Insulin 30 u BID   Continue Victoza, and Amaryl Rx   SSI coverage PRN   Check HbA1c    6.   Chronic diastolic congestive heart failure   Continue Demedex with KCL, and Spironolactone Rx    7.   Constipation   Lactulose PO x 1 dose    8.   DVT prophylaxis   Lovenox      Code Status:      FULL CODE Family Communication:   No Family Present   Disposition Plan:  Inpatient       Time spent:   [place] [facility] Pager [phone]   If [place] Please Contact the Day Rounding Team MD for [facility]  If 7PM-7AM, Please Contact night-coverage  [email or web address] Password [email or web address] 04/30/2014, 5:18 AM

## 2014-04-30 NOTE — Care Management Note (Addendum)
  Page 2 of 2   05/05/2014     2:52:34 PM CARE MANAGEMENT NOTE 05/05/2014  Patient:  [name], [name]   Account Number:  [id number]  Date Initiated:  04/30/2014  Documentation initiated by:  Malan Werk  Subjective/Objective Assessment:   SOB, COPD Exacerbation, Scarcoidosis     Action/Plan:   CM to follow for disposition needs   Anticipated DC Date:  05/03/2014   Anticipated DC Plan:  [place]  CM consult      [facility] Choice  Resumption Of Svcs/PTA Provider   Choice offered to / List presented to:  NA           Status of service:  Completed, signed off Medicare Important Message given?  YES (If response is "NO", the following Medicare IM given date fields will be blank) Date Medicare IM given:  05/01/2014 Medicare IM given by:  Massiah Longanecker Date Additional Medicare IM given:  05/04/2014 Additional Medicare IM given by:  Gertrue Willette  Discharge Disposition:    Per UR Regulation:  Reviewed for med. necessity/level of care/duration of stay  If discussed at [place] of Stay Meetings, dates discussed:   05/05/2014    Comments:  Mariann Laster RN, BSN, MSHL, CCM  Nurse - Case Manager,  (Unit [phone]  05/05/2014 Disposition Plan:  Home / self care with resumption of PCS services. SW provided patient with OP BH resources  [place] RN, BSN, MSHL, CCM  Nurse - Case Manager,  (Unit [place])  [phone]  04/30/2014 CM Consult:  "Medicaid Issues" Social:  Home alone with PCS Services 2 1/2 hours / day x 7 days/week. Patient clarifies no issue with MCD.  States c/o poor service provided by current Physiological scientist through [place] to change PCS providers.  Patient has contacted new provider:  [facility] and services would start in 10-12 days. Home DME:  Home O2 active Transportation:  MCD 12 round trips/year Medications:  states compliance and getting refills as needed. Disposition Plan:   Home / self care with resumption of PCS services.  CM Research: [facility] [place], [place], [place] [address] [phone] [email or web address]  [facility] [address] [place], [place] [address] [phone] [email or web address]
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
## 2014-05-01 DIAGNOSIS — I10 Essential (primary) hypertension: Secondary | ICD-10-CM

## 2014-05-01 DIAGNOSIS — F3289 Other specified depressive episodes: Secondary | ICD-10-CM

## 2014-05-01 DIAGNOSIS — K219 Gastro-esophageal reflux disease without esophagitis: Secondary | ICD-10-CM

## 2014-05-01 DIAGNOSIS — F329 Major depressive disorder, single episode, unspecified: Secondary | ICD-10-CM

## 2014-05-01 LAB — BASIC METABOLIC PANEL
Anion gap: 15 (ref 5–15)
BUN: 29 mg/dL — ABNORMAL HIGH (ref 6–23)
CO2: 25 mEq/L (ref 19–32)
Calcium: 10.1 mg/dL (ref 8.4–10.5)
Chloride: 91 mEq/L — ABNORMAL LOW (ref 96–112)
Creatinine, Ser: 1.28 mg/dL — ABNORMAL HIGH (ref 0.50–1.10)
GFR calc non Af Amer: 49 mL/min — ABNORMAL LOW (ref >90)
GFR, EST AFRICAN AMERICAN: 57 mL/min — AB (ref >90)
Glucose, Bld: 311 mg/dL — ABNORMAL HIGH (ref 70–99)
POTASSIUM: 5.8 meq/L — AB (ref 3.7–5.3)
SODIUM: 131 meq/L — AB (ref 137–147)

## 2014-05-01 LAB — CBC
HCT: 44.5 % (ref 36.0–46.0)
Hemoglobin: 13.9 g/dL (ref 12.0–15.0)
MCH: 25.6 pg — ABNORMAL LOW (ref 26.0–34.0)
MCHC: 31.2 g/dL (ref 30.0–36.0)
MCV: 82 fL (ref 78.0–100.0)
Platelets: 237 10*3/uL (ref 150–400)
RBC: 5.43 MIL/uL — AB (ref 3.87–5.11)
RDW: 15.2 % (ref 11.5–15.5)
WBC: 14.2 10*3/uL — AB (ref 4.0–10.5)

## 2014-05-01 LAB — POTASSIUM: Potassium: 5 mEq/L (ref 3.7–5.3)

## 2014-05-01 LAB — GLUCOSE, CAPILLARY
GLUCOSE-CAPILLARY: 312 mg/dL — AB (ref 70–99)
GLUCOSE-CAPILLARY: 325 mg/dL — AB (ref 70–99)
GLUCOSE-CAPILLARY: 231 mg/dL — AB (ref 70–99)
Glucose-Capillary: 327 mg/dL — ABNORMAL HIGH (ref 70–99)

## 2014-05-01 MED ORDER — INSULIN ASPART 100 UNIT/ML ~~LOC~~ SOLN
0.0000 [IU] | Freq: Every day | SUBCUTANEOUS | Status: DC
  Administered 2014-05-01: 2 [IU] via SUBCUTANEOUS
  Administered 2014-05-02: 5 [IU] via SUBCUTANEOUS
  Administered 2014-05-03: 3 [IU] via SUBCUTANEOUS
  Administered 2014-05-04: 2 [IU] via SUBCUTANEOUS

## 2014-05-01 MED ORDER — IPRATROPIUM-ALBUTEROL 0.5-2.5 (3) MG/3ML IN SOLN
3.0000 mL | Freq: Four times a day (QID) | RESPIRATORY_TRACT | Status: DC
  Administered 2014-05-01 – 2014-05-03 (×11): 3 mL via RESPIRATORY_TRACT
  Filled 2014-05-01 (×12): qty 3

## 2014-05-01 MED ORDER — AMOXICILLIN-POT CLAVULANATE 875-125 MG PO TABS
1.0000 | ORAL_TABLET | Freq: Two times a day (BID) | ORAL | Status: DC
  Administered 2014-05-01 – 2014-05-05 (×8): 1 via ORAL
  Filled 2014-05-01 (×9): qty 1

## 2014-05-01 MED ORDER — GUAIFENESIN-CODEINE 100-10 MG/5ML PO SOLN
5.0000 mL | ORAL | Status: DC | PRN
  Administered 2014-05-01 – 2014-05-02 (×2): 5 mL via ORAL
  Filled 2014-05-01 (×2): qty 5

## 2014-05-01 MED ORDER — PREDNISONE 20 MG PO TABS
40.0000 mg | ORAL_TABLET | Freq: Every day | ORAL | Status: DC
  Administered 2014-05-01 – 2014-05-05 (×5): 40 mg via ORAL
  Filled 2014-05-01 (×7): qty 2

## 2014-05-01 MED ORDER — INSULIN ASPART 100 UNIT/ML ~~LOC~~ SOLN
0.0000 [IU] | Freq: Three times a day (TID) | SUBCUTANEOUS | Status: DC
  Administered 2014-05-01: 15 [IU] via SUBCUTANEOUS
  Administered 2014-05-02: 20 [IU] via SUBCUTANEOUS
  Administered 2014-05-02 – 2014-05-03 (×2): 11 [IU] via SUBCUTANEOUS
  Administered 2014-05-04: 3 [IU] via SUBCUTANEOUS
  Administered 2014-05-04: 11 [IU] via SUBCUTANEOUS
  Administered 2014-05-05: 3 [IU] via SUBCUTANEOUS
  Administered 2014-05-05: 4 [IU] via SUBCUTANEOUS
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
## 2014-05-01 NOTE — Progress Notes (Signed)
Pt. A/Ox4 and is ambulatory without assist. She has c/o chronic lower back pain and prn pain medication was given. She is on 4 L [place] of oxygen.

## 2014-05-01 NOTE — Progress Notes (Signed)
[facility] PROGRESS NOTE  SCHYLAR ALLARD HKV:[id number] DOB: 1966-09-01 DOA: 04/30/2014 PCP: Gwendolyn Grant, MD  Assessment/Plan: 1. COPD exacerbation  -Change iv steroids to prednisone -Continue DUONebs O2 RPN  -Will add empiric antibiotics with Augmentin and follow he Monitor O2 sats  Active Problems:  2. PULMONARY SARCOIDOSIS  stable  3. HYPERTENSION   Monitor BPs. On diuretics and further treat accordingly  4. OBSTRUCTIVE SLEEP APNEA  Continue CPAP qhs  5. Diabetes type 2, uncontrolled  -Continue Lantus Insulin 30 u BID   and Amaryl Rx  -Per pharmacy Victoza not available in the hospital will place on SSI coverage PRN  last HbA1c 3 weeks ago was 8.0 6. Chronic diastolic congestive heart failure  Continue Demedex, and Spironolactone Rx  -Will hold off KCl supplements for now given hyperkalemia 7. Constipation  -Continue senna and when necessary Dulcolax  8. Hyperkalemia -DC KCL supplements -Follow recheck, if persistent on recheck may need to hold spironolactone -Follow 9. Depression with homicidal in ideation -Continue Zoloft, i have consulted psych for further recommendations 10. DVT prophylaxis  Lovenox    Code Status: full Family Communication: Sister at bedside  Disposition Plan: To home when medically ready   Consultants:  Psych - pending  Procedures:  None  Antibiotics:  Augmentin started 8/7  HPI/Subjective: Complaining of nasal congestion, and tissues been feeling down a lot and reports homicidal ideations. Denies suicidal ideations. States she still feels some shortness of breath.  Objective: Filed Vitals:   05/01/14 1341  BP: 124/67  Pulse: 79  Temp: 97.5 F (36.4 C)  Resp: 20    Intake/Output Summary (Last 24 hours) at 05/01/14 1353 Last data filed at 05/01/14 1348  Gross per 24 hour  Intake   1323 ml  Output   2550 ml  Net  -1227 ml   Filed Weights   04/29/14 2120 04/30/14 0520 05/01/14 5643  Weight: 136.079 kg  (300 lb) 137.1 kg (302 lb 4 oz) 137.485 kg (303 lb 1.6 oz)    Exam:  General: alert & oriented x 3 In NAD Cardiovascular: RRR, nl S1 s2 Respiratory: Decreased air entry bilaterally, no crackles or wheezes Abdomen: soft +BS NT/ND, no masses palpable Extremities: No cyanosis and no edema    Data Reviewed: Basic Metabolic Panel:  Recent Labs Lab 04/30/14 0059 04/30/14 0933 05/01/14 0500  NA 136*  --  131*  K 3.7  --  5.8*  CL 96  --  91*  CO2 25  --  25  GLUCOSE 199*  --  311*  BUN 16  --  29*  CREATININE 1.11* 1.13* 1.28*  CALCIUM 8.2*  --  10.1   Liver Function Tests: No results found for this basename: AST, ALT, ALKPHOS, BILITOT, PROT, ALBUMIN,  in the last 168 hours No results found for this basename: LIPASE, AMYLASE,  in the last 168 hours No results found for this basename: AMMONIA,  in the last 168 hours CBC:  Recent Labs Lab 04/30/14 0059 04/30/14 0933 05/01/14 0500  WBC 7.4 8.3 14.2*  HGB 13.3 13.7 13.9  HCT 41.8 43.3 44.5  MCV 79.8 80.5 82.0  PLT 194 223 237   Cardiac Enzymes: No results found for this basename: CKTOTAL, CKMB, CKMBINDEX, TROPONINI,  in the last 168 hours BNP (last 3 results)  Recent Labs  01/18/14 2034 02/11/14 0021 04/17/14 0120  PROBNP 802.5* 32.8 44.7   CBG:  Recent Labs Lab 04/30/14 1220 04/30/14 1701 04/30/14 2005 05/01/14 0645 05/01/14 1134  GLUCAP 299*  263* 296* 312* 325*    No results found for this or any previous visit (from the past 240 hour(s)).   Studies: Dg Chest 2 View  04/29/2014   CLINICAL DATA:  Asthma and cough.  EXAM: CHEST  2 VIEW  COMPARISON:  04/17/2014  FINDINGS: Borderline cardiomegaly. The hila are prominent due to enlargement of the pulmonary arterial tree. There is chronic interstitial coarsening, especially at the bases. Recent characterization by CT 01/19/2014. No evidence of superimposed edema or pneumonia. No effusion or pneumothorax.  IMPRESSION: COPD with bibasilar scarring. No evidence  of acute superimposed disease.   Electronically Signed   By: Jorje Guild M.D.   On: 04/29/2014 22:02    Scheduled Meds: . ALPRAZolam  1 mg Oral TID  . aspirin EC  81 mg Oral Daily  . enoxaparin (LOVENOX) injection  70 mg Subcutaneous Q24H  . glimepiride  1 mg Oral QAC breakfast  . insulin aspart  0-20 Units Subcutaneous TID WC  . insulin aspart  0-5 Units Subcutaneous QHS  . insulin glargine  30 Units Subcutaneous BID  . ipratropium-albuterol  3 mL Nebulization QID  . Liraglutide  0.6 Units Subcutaneous Daily  . loratadine  10 mg Oral Daily  . nicotine  14 mg Transdermal Daily  . pantoprazole  40 mg Oral BID  . potassium chloride SA  40 mEq Oral BID  . senna  2 tablet Oral QHS  . sertraline  100 mg Oral Daily  . sodium chloride  3 mL Intravenous Q12H  . spironolactone  25 mg Oral BID  . torsemide  100 mg Oral BID   Continuous Infusions: . sodium chloride Stopped (04/30/14 0845)    Principal Problem:   COPD exacerbation Active Problems:   PULMONARY SARCOIDOSIS   HYPERTENSION   OBSTRUCTIVE SLEEP APNEA   Diabetes type 2, uncontrolled   Asthma exacerbation   Chronic diastolic congestive heart failure    Time spent:35    Sheila Oats  [facility] Pager [phone] If 7PM-7AM, please contact night-coverage at [email or web address], password [facility] 05/01/2014, 1:53 PM  LOS: 1 day

## 2014-05-01 NOTE — Progress Notes (Signed)
Pt had questionable runs of Vtach -Vfib . Checked pt asymptomatic, sitting at side of the bed eating her sandwitch without any c/o CP, no palpitations,Pt stated "I'm all right". Reapplied tele leads which are coming off. Pt frequently turns and moves around the room . Referred questionable rhythym to Rapid response nurse and charge nurse. Observed pt closely.

## 2014-05-02 DIAGNOSIS — G4733 Obstructive sleep apnea (adult) (pediatric): Secondary | ICD-10-CM

## 2014-05-02 DIAGNOSIS — D649 Anemia, unspecified: Secondary | ICD-10-CM

## 2014-05-02 LAB — BASIC METABOLIC PANEL
Anion gap: 13 (ref 5–15)
BUN: 35 mg/dL — ABNORMAL HIGH (ref 6–23)
CHLORIDE: 90 meq/L — AB (ref 96–112)
CO2: 31 meq/L (ref 19–32)
CREATININE: 1.24 mg/dL — AB (ref 0.50–1.10)
Calcium: 9.4 mg/dL (ref 8.4–10.5)
GFR calc Af Amer: 59 mL/min — ABNORMAL LOW (ref >90)
GFR calc non Af Amer: 51 mL/min — ABNORMAL LOW (ref >90)
GLUCOSE: 352 mg/dL — AB (ref 70–99)
POTASSIUM: 4.6 meq/L (ref 3.7–5.3)
Sodium: 134 mEq/L — ABNORMAL LOW (ref 137–147)

## 2014-05-02 LAB — GLUCOSE, CAPILLARY
GLUCOSE-CAPILLARY: 275 mg/dL — AB (ref 70–99)
GLUCOSE-CAPILLARY: 126 mg/dL — AB (ref 70–99)
Glucose-Capillary: 409 mg/dL — ABNORMAL HIGH (ref 70–99)
Glucose-Capillary: 554 mg/dL (ref 70–99)

## 2014-05-02 MED ORDER — INSULIN ASPART 100 UNIT/ML ~~LOC~~ SOLN
8.0000 [IU] | Freq: Once | SUBCUTANEOUS | Status: AC
  Administered 2014-05-02: 8 [IU] via SUBCUTANEOUS

## 2014-05-02 NOTE — Progress Notes (Signed)
[facility] PROGRESS NOTE  GRACEANNE GUIN KWI:[id number] DOB: 09-May-1966 DOA: 04/30/2014 PCP: Gwendolyn Grant, MD  Assessment/Plan: 1. COPD exacerbation  -Continue prednisone -Continue DUONebs O2 RPN  -Continue Augmentin and follow -Mobilize, follow  Monitor O2 sats she is home O2 dependent  Active Problems:  2. PULMONARY SARCOIDOSIS  stable  3. HYPERTENSION   Monitor BPs. On diuretics and further treat accordingly  4. OBSTRUCTIVE SLEEP APNEA  Continue CPAP qhs  5. Diabetes type 2, uncontrolled  -Continue Lantus Insulin 30 u BID   and Amaryl Rx  -Per pharmacy Victoza not available in the hospital will place on SSI coverage PRN  last HbA1c 3 weeks ago was 8.0 6. Chronic diastolic congestive heart failure  Continue Demedex, and Spironolactone Rx  -Will hold off KCl supplements for now given hyperkalemia 7. Constipation  -Continue senna and when necessary Dulcolax  8. Hyperkalemia -DC KCL supplements -Follow recheck, if persistent on recheck may need to hold spironolactone -Follow 9. 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 DVT prophylaxis  Lovenox 10. Hyponatremia -Improved with hydration.   Code Status: full Family Communication: Sister at bedside  Disposition Plan: To home when medically ready   Consultants:  Psych - pending  Procedures:  None  Antibiotics:  Augmentin started 8/7  HPI/Subjective:  states breathing better but still some shortness of breath with minimal activity   Objective: Filed Vitals:   05/02/14 0508  BP: 141/68  Pulse: 78  Temp: 97.9 F (36.6 C)  Resp: 20    Intake/Output Summary (Last 24 hours) at 05/02/14 1308 Last data filed at 05/02/14 0830  Gross per 24 hour  Intake   1560 ml  Output   4600 ml  Net  -3040 ml   Filed Weights   04/30/14 0520 05/01/14 0638 05/02/14 0508  Weight: 137.1 kg (302 lb 4 oz) 137.485 kg (303 lb  1.6 oz) 136.3 kg (300 lb 7.8 oz)    Exam:  General: alert & oriented x 3 In NAD Cardiovascular: RRR, nl S1 s2 Respiratory: Moderate air movement, no crackles or wheezes Abdomen: soft +BS NT/ND, no masses palpable Extremities: No cyanosis and no edema    Data Reviewed: Basic Metabolic Panel:  Recent Labs Lab 04/30/14 0059 04/30/14 0933 05/01/14 0500 05/01/14 1959 05/02/14 0345  NA 136*  --  131*  --  134*  K 3.7  --  5.8* 5.0 4.6  CL 96  --  91*  --  90*  CO2 25  --  25  --  31  GLUCOSE 199*  --  311*  --  352*  BUN 16  --  29*  --  35*  CREATININE 1.11* 1.13* 1.28*  --  1.24*  CALCIUM 8.2*  --  10.1  --  9.4   Liver Function Tests: No results found for this basename: AST, ALT, ALKPHOS, BILITOT, PROT, ALBUMIN,  in the last 168 hours No results found for this basename: LIPASE, AMYLASE,  in the last 168 hours No results found for this basename: AMMONIA,  in the last 168 hours CBC:  Recent Labs Lab 04/30/14 0059 04/30/14 0933 05/01/14 0500  WBC 7.4 8.3 14.2*  HGB 13.3 13.7 13.9  HCT 41.8 43.3 44.5  MCV 79.8 80.5 82.0  PLT 194 223 237   Cardiac Enzymes: No results found for this basename: CKTOTAL, CKMB, CKMBINDEX, TROPONINI,  in the last 168 hours BNP (last 3 results)  Recent Labs  01/18/14  2034 02/11/14 0021 04/17/14 0120  PROBNP 802.5* 32.8 44.7   CBG:  Recent Labs Lab 05/01/14 1134 05/01/14 1621 05/01/14 2111 05/02/14 0556 05/02/14 1104  GLUCAP 325* 327* 231* 275* 126*    No results found for this or any previous visit (from the past 240 hour(s)).   Studies: No results found.  Scheduled Meds: . ALPRAZolam  1 mg Oral TID  . amoxicillin-clavulanate  1 tablet Oral Q12H  . aspirin EC  81 mg Oral Daily  . enoxaparin (LOVENOX) injection  70 mg Subcutaneous Q24H  . glimepiride  1 mg Oral QAC breakfast  . insulin aspart  0-20 Units Subcutaneous TID WC  . insulin aspart  0-5 Units Subcutaneous QHS  . insulin glargine  30 Units Subcutaneous BID   . ipratropium-albuterol  3 mL Nebulization QID  . Liraglutide  0.6 Units Subcutaneous Daily  . loratadine  10 mg Oral Daily  . nicotine  14 mg Transdermal Daily  . pantoprazole  40 mg Oral BID  . predniSONE  40 mg Oral Q breakfast  . senna  2 tablet Oral QHS  . sertraline  100 mg Oral Daily  . sodium chloride  3 mL Intravenous Q12H  . spironolactone  25 mg Oral BID  . torsemide  100 mg Oral BID   Continuous Infusions: . sodium chloride Stopped (04/30/14 0845)    Principal Problem:   COPD exacerbation Active Problems:   PULMONARY SARCOIDOSIS   HYPERTENSION   OBSTRUCTIVE SLEEP APNEA   Diabetes type 2, uncontrolled   Asthma exacerbation   Chronic diastolic congestive heart failure    Time spent:35    Sheila Oats  [facility] Pager [phone] If 7PM-7AM, please contact night-coverage at [email or web address], password [facility] 05/02/2014, 1:08 PM  LOS: 2 days

## 2014-05-03 ENCOUNTER — Other Ambulatory Visit: Payer: Self-pay | Admitting: Internal Medicine

## 2014-05-03 DIAGNOSIS — I509 Heart failure, unspecified: Secondary | ICD-10-CM

## 2014-05-03 DIAGNOSIS — I5032 Chronic diastolic (congestive) heart failure: Secondary | ICD-10-CM

## 2014-05-03 LAB — GLUCOSE, CAPILLARY
GLUCOSE-CAPILLARY: 114 mg/dL — AB (ref 70–99)
GLUCOSE-CAPILLARY: 272 mg/dL — AB (ref 70–99)
Glucose-Capillary: 87 mg/dL (ref 70–99)
Glucose-Capillary: 298 mg/dL — ABNORMAL HIGH (ref 70–99)

## 2014-05-03 MED ORDER — HALOPERIDOL 2 MG PO TABS
2.0000 mg | ORAL_TABLET | Freq: Every day | ORAL | Status: DC
  Administered 2014-05-03: 2 mg via ORAL
  Filled 2014-05-03 (×2): qty 1

## 2014-05-03 MED ORDER — SERTRALINE HCL 100 MG PO TABS
150.0000 mg | ORAL_TABLET | Freq: Every day | ORAL | Status: DC
  Administered 2014-05-04 – 2014-05-05 (×2): 150 mg via ORAL
  Filled 2014-05-03 (×2): qty 1

## 2014-05-03 MED ORDER — SERTRALINE HCL 50 MG PO TABS: 50.0000 mg | ORAL_TABLET | Freq: Every day | ORAL | Status: DC

## 2014-05-03 NOTE — Progress Notes (Signed)
[facility] PROGRESS NOTE  ALSACE DOWD TLX:[id number] DOB: 26-Mar-1966 DOA: 04/30/2014 PCP: Gwendolyn Grant, MD  Assessment/Plan: 1. COPD exacerbation  -Continue prednisone -Continue DUONebs O2 RPN  -Continue Augmentin and follow -Monitor O2 sats she is home O2 dependent  -continue to Mobilize, follow  Active Problems:  2. PULMONARY SARCOIDOSIS  stable  3. HYPERTENSION   Monitor BPs. On diuretics and further treat accordingly  4. OBSTRUCTIVE SLEEP APNEA  Continue CPAP qhs  5. Diabetes type 2, uncontrolled  -Continue Lantus Insulin 30 u BID   and Amaryl Rx  -Per pharmacy Victoza not available in the hospital will place on SSI coverage PRN  last HbA1c 3 weeks ago was 8.0 6. Chronic diastolic congestive heart failure  Continue Demadex, and Spironolactone Rx   7. Constipation  -Continue senna and when necessary Dulcolax  8. Hyperkalemia -resolved off KCl supplements 9. 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 Hyponatremia -Improved with hydration.  11. DVT prophylaxis  Lovenox  Code Status: full Family Communication: Sister at bedside  Disposition Plan: To home when medically ready   Consultants:  Psych - pending  Procedures:  None  Antibiotics:  Augmentin started 8/7  HPI/Subjective:  states breathing better, denies CP  Objective: Filed Vitals:   05/03/14 1422  BP: 134/85  Pulse: 74  Temp: 98.3 F (36.8 C)  Resp: 20    Intake/Output Summary (Last 24 hours) at 05/03/14 1826 Last data filed at 05/03/14 1349  Gross per 24 hour  Intake   1440 ml  Output   2700 ml  Net  -1260 ml   Filed Weights   05/01/14 0638 05/02/14 0508 05/03/14 0521  Weight: 137.485 kg (303 lb 1.6 oz) 136.3 kg (300 lb 7.8 oz) 135.5 kg (298 lb 11.6 oz)    Exam:  General: alert & oriented x 3 In NAD Cardiovascular: RRR, nl S1 s2 Respiratory:  Moderate air movement, no crackles or wheezes Abdomen: soft +BS NT/ND, no masses palpable Extremities: No cyanosis and no edema    Data Reviewed: Basic Metabolic Panel:  Recent Labs Lab 04/30/14 0059 04/30/14 0933 05/01/14 0500 05/01/14 1959 05/02/14 0345  NA 136*  --  131*  --  134*  K 3.7  --  5.8* 5.0 4.6  CL 96  --  91*  --  90*  CO2 25  --  25  --  31  GLUCOSE 199*  --  311*  --  352*  BUN 16  --  29*  --  35*  CREATININE 1.11* 1.13* 1.28*  --  1.24*  CALCIUM 8.2*  --  10.1  --  9.4   Liver Function Tests: No results found for this basename: AST, ALT, ALKPHOS, BILITOT, PROT, ALBUMIN,  in the last 168 hours No results found for this basename: LIPASE, AMYLASE,  in the last 168 hours No results found for this basename: AMMONIA,  in the last 168 hours CBC:  Recent Labs Lab 04/30/14 0059 04/30/14 0933 05/01/14 0500  WBC 7.4 8.3 14.2*  HGB 13.3 13.7 13.9  HCT 41.8 43.3 44.5  MCV 79.8 80.5 82.0  PLT 194 223 237   Cardiac Enzymes: No results found for this basename: CKTOTAL, CKMB, CKMBINDEX, TROPONINI,  in the last 168 hours BNP (last 3 results)  Recent Labs  01/18/14 2034 02/11/14 0021 04/17/14 0120  PROBNP 802.5* 32.8 44.7   CBG:  Recent  Labs Lab 05/02/14 1608 05/02/14 2050 05/03/14 0554 05/03/14 1122 05/03/14 1622  GLUCAP 409* 554* 87 114* 272*    No results found for this or any previous visit (from the past 240 hour(s)).   Studies: No results found.  Scheduled Meds: . ALPRAZolam  1 mg Oral TID  . amoxicillin-clavulanate  1 tablet Oral Q12H  . aspirin EC  81 mg Oral Daily  . enoxaparin (LOVENOX) injection  70 mg Subcutaneous Q24H  . glimepiride  1 mg Oral QAC breakfast  . insulin aspart  0-20 Units Subcutaneous TID WC  . insulin aspart  0-5 Units Subcutaneous QHS  . insulin glargine  30 Units Subcutaneous BID  . ipratropium-albuterol  3 mL Nebulization QID  . Liraglutide  0.6 Units Subcutaneous Daily  . loratadine  10 mg Oral Daily  .  nicotine  14 mg Transdermal Daily  . pantoprazole  40 mg Oral BID  . predniSONE  40 mg Oral Q breakfast  . senna  2 tablet Oral QHS  . [START ON 05/04/2014] sertraline  150 mg Oral Daily  . sodium chloride  3 mL Intravenous Q12H  . spironolactone  25 mg Oral BID  . torsemide  100 mg Oral BID   Continuous Infusions: . sodium chloride Stopped (04/30/14 0845)    Principal Problem:   COPD exacerbation Active Problems:   PULMONARY SARCOIDOSIS   HYPERTENSION   OBSTRUCTIVE SLEEP APNEA   Diabetes type 2, uncontrolled   Asthma exacerbation   Chronic diastolic congestive heart failure    Time spent:35    Sheila Oats  [facility] Pager [phone] If 7PM-7AM, please contact night-coverage at [email or web address], password [facility] 05/03/2014, 6:26 PM  LOS: 3 days

## 2014-05-03 NOTE — Consult Note (Addendum)
[name] Face-to-Face Psychiatry Consult   Reason for Consult:  Depression and paranoia Referring Physician:  Dr  [name] is an 48 y.o. female. Total Time spent with patient: 20 minutes  Assessment: AXIS I:  Major Depression, Recurrent severe AXIS II:  Deferred AXIS III:   Past Medical History  Diagnosis Date  . Hypertension   . Hyperlipidemia   . Chronic headache   . Fibromyalgia     daily narcotics  . Anxiety     hx chronic BZ use, stopped 07/2010  . Anemia   . Pulmonary sarcoidosis     unimpressive CT chest 2011  . Colonic polyp   . GERD (gastroesophageal reflux disease)   . ALLERGIC RHINITIS   . Asthma   . CHF (congestive heart failure)     Diastolic with fluid overload, May, 2012, LVEF 60%  . Morbid obesity   . Depression   . Panic attacks   . Diabetes mellitus   . COPD (chronic obstructive pulmonary disease)     on home O2, moderate airflow obstruction, suspect d/t emphysema  . Obesity   . Elevated LFTs 09/2011  . Ovarian cyst   . Chronic back pain   . Sleep apnea      CPAP   AXIS IV:  other psychosocial or environmental problems and problems related to social environment AXIS V:  51-60 moderate symptoms  Plan:  No evidence of imminent risk to self or others at present.   Patient does not meet criteria for psychiatric inpatient admission. Supportive therapy provided about ongoing stressors. Discussed crisis plan, support from social network, calling 911, coming to the Emergency Department, and calling Suicide Hotline. Medication management  Subjective:   [name] is a 47 y.o. female patient admitted with COPD and shortness of breath.  HPI:  Patient seen and chart reviewed.  Patient is 48 year old African American single female who was admitted due to exacerbation of COPD.  The patient has multiple health issues including hypertension, diabetes mellitus, pulmonary sarcoidosis.  Patient expressed increased depression, anxiety and some times  feeling paranoia and having hallucination.  She is not comfortable around people.  She gets very agitated irritable and having anger.  She admitted increased stress in recent weeks.  She is taking Zoloft from her primary care physician but does not feel it is working very well.  She is having poor sleep, crying spells, feeling people talking about her.  She also endorsed auditory hallucinations that people having conversations about her.  She reported her symptoms got worse in past few months.  She endorse passive and vague suicidal thoughts and homicidal thoughts but she does not want to die and does not want to hurt anybody.  She endorsed that she is tired being sick and frustrated with her medical issues.  She wants t get better. Patient is obese female .  She admitted not getting along with her home health aide .  She has no children.  Her sister lives in [place] .  She has limited social network.  She feels sometimes burden to everyone.  Patient denies any active or command auditory hallucinations, suicidal thoughts or homicidal thoughts but admitted irritability, anger and mood swings.  She endorse decrease in her ADLs, social isolation and withdrawn.  She has decreased energy.  She is taking Zoloft 100 mg and denies any side effects.  She is willing to adjust or add something to help her paranoia, insomnia and irritability.  Patient reported her mother died  7 years ago and she still had grief about her.  She was seeing therapist that she was living in [place] however she has not seen any psychiatrist or therapist since she moved to [place] 5 years ago.  Patient denies any drug use.  She denies any alcohol abuse.    Past Psychiatric History: Past Medical History  Diagnosis Date  . Hypertension   . Hyperlipidemia   . Chronic headache   . Fibromyalgia     daily narcotics  . Anxiety     hx chronic BZ use, stopped 07/2010  . Anemia   . Pulmonary sarcoidosis     unimpressive CT chest  2011  . Colonic polyp   . GERD (gastroesophageal reflux disease)   . ALLERGIC RHINITIS   . Asthma   . CHF (congestive heart failure)     Diastolic with fluid overload, May, 2012, LVEF 60%  . Morbid obesity   . Depression   . Panic attacks   . Diabetes mellitus   . COPD (chronic obstructive pulmonary disease)     on home O2, moderate airflow obstruction, suspect d/t emphysema  . Obesity   . Elevated LFTs 09/2011  . Ovarian cyst   . Chronic back pain   . Sleep apnea      CPAP    reports that she quit smoking about 3 months ago. Her smoking use included Cigarettes. She has a 7.25 pack-year smoking history. She has never used smokeless tobacco. She reports that she drinks alcohol. She reports that she uses illicit drugs (Cocaine and Marijuana). Family History  Problem Relation Age of Onset  . Hypertension Mother   . Emphysema Father   . Hypertension Father   . Stomach cancer Father   . Allergies Brother   . Hypertension Brother   . Stomach cancer Brother   . Heart disease Mother   . Heart disease Father   . Heart disease Brother     died age 27 sudden death/MI     Living Arrangements: Alone   Abuse/Neglect [facility]) Physical Abuse: Denies Verbal Abuse: Denies Sexual Abuse: Denies Allergies:   Allergies  Allergen Reactions  . Simvastatin Other (See Comments)    Severe leg pain and burning  . Sulfamethoxazole-Trimethoprim Nausea And Vomiting    Causes Projectile Vomiting  . Zolpidem Tartrate Other (See Comments)    Chest pain  . Azithromycin Other (See Comments)    Resistant to med  . Doxycycline Other (See Comments)    Resistant to med  . Latex Itching and Rash    Also burning sensations  . Metformin And Related Diarrhea    Severe diarrhea  . Metronidazole Nausea And Vomiting  . Metolazone Other (See Comments)    Cramps,pain  . Ciprofloxacin Nausea Only    ACT Assessment Complete:  No:   Past Psychiatric History: Patient has no previous history of psychiatric  inpatient treatment however endorse getting counseling when she was living in [place].  Patient denies any previous history suicidal attempt.  She is taking Zoloft from her primary care physician.  She endorsed visited once in the  Psychiatric emergency room last year however she was discharged with the recommendation to increase Zoloft.  Place of Residence:  Patient lives by herself. Marital Status:  Single Family Supports:  Her sister lives close by. Objective: Blood pressure 118/51, pulse 76, temperature 98.4 F (36.9 C), temperature source Oral, resp. rate 19, height 5' 8"  (1.727 m), weight 298 lb 11.6 oz (135.5 kg), last menstrual period  10/16/2012, SpO2 91.00%.Body mass index is 45.43 kg/(m^2). Results for orders placed during the hospital encounter of 04/30/14 (from the past 72 hour(s))  GLUCOSE, CAPILLARY     Status: Abnormal   Collection Time    04/30/14  5:01 PM      Result Value Ref Range   Glucose-Capillary 263 (*) 70 - 99 mg/dL   Comment 1 Notify RN    URINALYSIS, ROUTINE W REFLEX MICROSCOPIC     Status: Abnormal   Collection Time    04/30/14  5:42 PM      Result Value Ref Range   Color, Urine YELLOW  YELLOW   APPearance CLEAR  CLEAR   Specific Gravity, Urine 1.012  1.005 - 1.030   pH 6.0  5.0 - 8.0   Glucose, UA 250 (*) NEGATIVE mg/dL   Hgb urine dipstick NEGATIVE  NEGATIVE   Bilirubin Urine NEGATIVE  NEGATIVE   Ketones, ur NEGATIVE  NEGATIVE mg/dL   Protein, ur NEGATIVE  NEGATIVE mg/dL   Urobilinogen, UA 0.2  0.0 - 1.0 mg/dL   Nitrite NEGATIVE  NEGATIVE   Leukocytes, UA NEGATIVE  NEGATIVE   Comment: MICROSCOPIC NOT DONE ON URINES WITH NEGATIVE PROTEIN, BLOOD, LEUKOCYTES, NITRITE, OR GLUCOSE <1000 mg/dL.  GLUCOSE, CAPILLARY     Status: Abnormal   Collection Time    04/30/14  8:05 PM      Result Value Ref Range   Glucose-Capillary 296 (*) 70 - 99 mg/dL  BASIC METABOLIC PANEL     Status: Abnormal   Collection Time    05/01/14  5:00 AM      Result Value Ref Range    Sodium 131 (*) 137 - 147 mEq/L   Potassium 5.8 (*) 3.7 - 5.3 mEq/L   Comment: DELTA CHECK NOTED     SLIGHT HEMOLYSIS   Chloride 91 (*) 96 - 112 mEq/L   CO2 25  19 - 32 mEq/L   Glucose, Bld 311 (*) 70 - 99 mg/dL   BUN 29 (*) 6 - 23 mg/dL   Comment: DELTA CHECK NOTED   Creatinine, Ser 1.28 (*) 0.50 - 1.10 mg/dL   Calcium 10.1  8.4 - 10.5 mg/dL   GFR calc non Af Amer 49 (*) >90 mL/min   GFR calc Af Amer 57 (*) >90 mL/min   Comment: (NOTE)     The eGFR has been calculated using the CKD EPI equation.     This calculation has not been validated in all clinical situations.     eGFR's persistently <90 mL/min signify possible Chronic Kidney     Disease.   Anion gap 15  5 - 15  CBC     Status: Abnormal   Collection Time    05/01/14  5:00 AM      Result Value Ref Range   WBC 14.2 (*) 4.0 - 10.5 K/uL   RBC 5.43 (*) 3.87 - 5.11 MIL/uL   Hemoglobin 13.9  12.0 - 15.0 g/dL   HCT 44.5  36.0 - 46.0 %   MCV 82.0  78.0 - 100.0 fL   MCH 25.6 (*) 26.0 - 34.0 pg   MCHC 31.2  30.0 - 36.0 g/dL   RDW 15.2  11.5 - 15.5 %   Platelets 237  150 - 400 K/uL  GLUCOSE, CAPILLARY     Status: Abnormal   Collection Time    05/01/14  6:45 AM      Result Value Ref Range   Glucose-Capillary 312 (*) 70 - 99 mg/dL  GLUCOSE, CAPILLARY     Status: Abnormal   Collection Time    05/01/14 11:34 AM      Result Value Ref Range   Glucose-Capillary 325 (*) 70 - 99 mg/dL   Comment 1 Notify RN    GLUCOSE, CAPILLARY     Status: Abnormal   Collection Time    05/01/14  4:21 PM      Result Value Ref Range   Glucose-Capillary 327 (*) 70 - 99 mg/dL   Comment 1 Notify RN    POTASSIUM     Status: None   Collection Time    05/01/14  7:59 PM      Result Value Ref Range   Potassium 5.0  3.7 - 5.3 mEq/L  GLUCOSE, CAPILLARY     Status: Abnormal   Collection Time    05/01/14  9:11 PM      Result Value Ref Range   Glucose-Capillary 231 (*) 70 - 99 mg/dL  BASIC METABOLIC PANEL     Status: Abnormal   Collection Time     05/02/14  3:45 AM      Result Value Ref Range   Sodium 134 (*) 137 - 147 mEq/L   Potassium 4.6  3.7 - 5.3 mEq/L   Chloride 90 (*) 96 - 112 mEq/L   CO2 31  19 - 32 mEq/L   Glucose, Bld 352 (*) 70 - 99 mg/dL   BUN 35 (*) 6 - 23 mg/dL   Creatinine, Ser 1.24 (*) 0.50 - 1.10 mg/dL   Calcium 9.4  8.4 - 10.5 mg/dL   GFR calc non Af Amer 51 (*) >90 mL/min   GFR calc Af Amer 59 (*) >90 mL/min   Comment: (NOTE)     The eGFR has been calculated using the CKD EPI equation.     This calculation has not been validated in all clinical situations.     eGFR's persistently <90 mL/min signify possible Chronic Kidney     Disease.   Anion gap 13  5 - 15  GLUCOSE, CAPILLARY     Status: Abnormal   Collection Time    05/02/14  5:56 AM      Result Value Ref Range   Glucose-Capillary 275 (*) 70 - 99 mg/dL  GLUCOSE, CAPILLARY     Status: Abnormal   Collection Time    05/02/14 11:04 AM      Result Value Ref Range   Glucose-Capillary 126 (*) 70 - 99 mg/dL   Comment 1 Notify RN    GLUCOSE, CAPILLARY     Status: Abnormal   Collection Time    05/02/14  4:08 PM      Result Value Ref Range   Glucose-Capillary 409 (*) 70 - 99 mg/dL   Comment 1 Notify RN    GLUCOSE, CAPILLARY     Status: Abnormal   Collection Time    05/02/14  8:50 PM      Result Value Ref Range   Glucose-Capillary 554 (*) 70 - 99 mg/dL   Comment 1 Notify RN    GLUCOSE, CAPILLARY     Status: None   Collection Time    05/03/14  5:54 AM      Result Value Ref Range   Glucose-Capillary 87  70 - 99 mg/dL  GLUCOSE, CAPILLARY     Status: Abnormal   Collection Time    05/03/14 11:22 AM      Result Value Ref Range   Glucose-Capillary 114 (*) 70 - 99 mg/dL  Comment 1 Notify RN     Labs are reviewed.  Current Facility-Administered Medications  Medication Dose Route Frequency Provider Last Rate Last Dose  . 0.9 %  sodium chloride infusion   Intravenous Continuous Theressa Millard, MD      . acetaminophen (TYLENOL) tablet 650 mg  650 mg  Oral Q6H PRN Theressa Millard, MD   650 mg at 05/03/14 0551   Or  . acetaminophen (TYLENOL) suppository 650 mg  650 mg Rectal Q6H PRN Theressa Millard, MD      . ALPRAZolam Duanne Moron) tablet 1 mg  1 mg Oral TID Theressa Millard, MD   1 mg at 05/03/14 1028  . alum & mag hydroxide-simeth (MAALOX/MYLANTA) 200-200-20 MG/5ML suspension 30 mL  30 mL Oral Q6H PRN Theressa Millard, MD      . amoxicillin-clavulanate (AUGMENTIN) 875-125 MG per tablet 1 tablet  1 tablet Oral Q12H Sheila Oats, MD   1 tablet at 05/03/14 1028  . aspirin EC tablet 81 mg  81 mg Oral Daily Theressa Millard, MD   81 mg at 05/03/14 1028  . bisacodyl (DULCOLAX) EC tablet 10 mg  10 mg Oral Daily PRN Sheila Oats, MD   10 mg at 05/03/14 1258  . enoxaparin (LOVENOX) injection 70 mg  70 mg Subcutaneous Q24H Theressa Millard, MD   70 mg at 05/03/14 1030  . fluticasone (FLONASE) 50 MCG/ACT nasal spray 2 spray  2 spray Each Nare Daily PRN Theressa Millard, MD      . gabapentin (NEURONTIN) capsule 300 mg  300 mg Oral QHS PRN Theressa Millard, MD      . glimepiride (AMARYL) tablet 1 mg  1 mg Oral QAC breakfast Theressa Millard, MD   1 mg at 05/03/14 0551  . guaiFENesin-codeine 100-10 MG/5ML solution 5 mL  5 mL Oral Q4H PRN Ritta Slot, NP   5 mL at 05/02/14 2143  . HYDROmorphone (DILAUDID) injection 0.5-1 mg  0.5-1 mg Intravenous Q3H PRN Theressa Millard, MD   1 mg at 05/03/14 1258  . hydrOXYzine (ATARAX/VISTARIL) tablet 10 mg  10 mg Oral TID PRN Theressa Millard, MD      . insulin aspart (novoLOG) injection 0-20 Units  0-20 Units Subcutaneous TID WC Sheila Oats, MD   20 Units at 05/02/14 1712  . insulin aspart (novoLOG) injection 0-5 Units  0-5 Units Subcutaneous QHS Sheila Oats, MD   5 Units at 05/02/14 2143  . insulin glargine (LANTUS) injection 30 Units  30 Units Subcutaneous BID Theressa Millard, MD   30 Units at 05/03/14 1028  . ipratropium-albuterol (DUONEB) 0.5-2.5 (3) MG/3ML nebulizer solution  3 mL  3 mL Nebulization QID Theressa Millard, MD   3 mL at 05/03/14 0704  . Liraglutide SOPN 0.6 Units  0.6 Units Subcutaneous Daily Theressa Millard, MD      . loratadine (CLARITIN) tablet 10 mg  10 mg Oral Daily Theressa Millard, MD   10 mg at 05/03/14 1028  . nicotine (NICODERM CQ - dosed in mg/24 hours) patch 14 mg  14 mg Transdermal Daily Theressa Millard, MD   14 mg at 05/03/14 1028  . ondansetron (ZOFRAN) tablet 4 mg  4 mg Oral Q6H PRN Theressa Millard, MD   4 mg at 04/30/14 1317   Or  . ondansetron (ZOFRAN) injection 4 mg  4 mg Intravenous Q6H PRN Theressa Millard, MD      .  oxyCODONE (Oxy IR/ROXICODONE) immediate release tablet 5 mg  5 mg Oral Q4H PRN Theressa Millard, MD   5 mg at 05/03/14 0235  . pantoprazole (PROTONIX) EC tablet 40 mg  40 mg Oral BID Theressa Millard, MD   40 mg at 05/03/14 1027  . predniSONE (DELTASONE) tablet 40 mg  40 mg Oral Q breakfast Adeline C Viyuoh, MD   40 mg at 05/03/14 1027  . promethazine (PHENERGAN) tablet 25 mg  25 mg Oral Q6H PRN Theressa Millard, MD      . senna (SENOKOT) tablet 17.2 mg  2 tablet Oral QHS Sheila Oats, MD   17.2 mg at 05/02/14 2143  . sertraline (ZOLOFT) tablet 100 mg  100 mg Oral Daily Theressa Millard, MD   100 mg at 05/03/14 1027  . sodium chloride (OCEAN) 0.65 % nasal spray 1 spray  1 spray Each Nare PRN Rhetta Mura Schorr, NP      . sodium chloride 0.9 % injection 3 mL  3 mL Intravenous Q12H Theressa Millard, MD   3 mL at 05/03/14 1028  . spironolactone (ALDACTONE) tablet 25 mg  25 mg Oral BID Theressa Millard, MD   25 mg at 05/03/14 1027  . tiZANidine (ZANAFLEX) tablet 4 mg  4 mg Oral Q8H PRN Theressa Millard, MD      . torsemide [facility]) tablet 100 mg  100 mg Oral BID Theressa Millard, MD   100 mg at 05/03/14 1027    Psychiatric Specialty Exam:     Blood pressure 118/51, pulse 76, temperature 98.4 F (36.9 C), temperature source Oral, resp. rate 19, height 5' 8"  (1.727 m), weight 298 lb 11.6  oz (135.5 kg), last menstrual period 10/16/2012, SpO2 91.00%.Body mass index is 45.43 kg/(m^2).  General Appearance: Morbid obese, casually dressed  Eye Contact::  Fair  Speech:  Slow  Volume:  Decreased  Mood:  Anxious and Depressed  Affect:  Constricted  Thought Process:  Goal Directed and Logical  Orientation:  Full (Time, Place, and Person)  Thought Content:  Paranoid Ideation  Suicidal Thoughts:  No  Homicidal Thoughts:  No  Memory:  Immediate;   Fair Recent;   Fair Remote;   Fair  Judgement:  Intact  Insight:  Good  Psychomotor Activity:  Decreased  Concentration:  Good  Recall:  Good  Fund of Knowledge:Fair  Language: Good  Akathisia:  No  Handed:  Right  AIMS (if indicated):     Assets:  Communication Skills Desire for Improvement Housing Social Support  Sleep:      Musculoskeletal: Strength & Muscle Tone: within normal limits Gait & Station: normal Patient leans: N/A  Treatment Plan Summary: Medication management Patient does not require inpatient psych services at this time however she can be followup outpatient for counseling and medication management.  She is willing to try low-dose Haldol to help paranoia, insomnia , irritability and hallucination.  Start Haldol 2 mg at bedtime and consider increasing Zoloft 150 mg if patient is not medically contraindicated for these adjustments.  The patient can be seen tomorrow by consultation liaison services for followup .  Patient can be given outpatient referral sources .  Please call [phone] if you have any further questions .    Zayleigh Stroh T. 05/03/2014 2:19 PM

## 2014-05-03 NOTE — Progress Notes (Signed)
Pt. Refuses CPAP at this time. Pt. Was made aware to let RT or RN anytime during the night if she changed her mind & decided to wear CPAP.

## 2014-05-03 NOTE — Progress Notes (Signed)
Pt's CBG=554; Walden Field NP notified, pt given 5 units per SS and additional 8 units of novolog, pt also given lantus 30 units per order. Will continue to monitor.

## 2014-05-03 NOTE — Progress Notes (Signed)
Pt. Has now decided to wear CPAP. Assisted pt. With placing herself on CPAP. CPAP is set on auto titrate (min: 5, max: 20) with 3L O2 bled in via nasal mask. Pt. Is tolerating CPAP well at this time without any complications.

## 2014-05-03 NOTE — Progress Notes (Signed)
Pt places herself on cpap. Cpap checked for settings, which are auto with a max of 20 min of 5. Pt knows to call if she needs any help or assistance with her cpap. RT will continue to monitor.

## 2014-05-04 ENCOUNTER — Other Ambulatory Visit: Payer: Self-pay | Admitting: Internal Medicine

## 2014-05-04 LAB — GLUCOSE, CAPILLARY
GLUCOSE-CAPILLARY: 256 mg/dL — AB (ref 70–99)
Glucose-Capillary: 75 mg/dL (ref 70–99)
Glucose-Capillary: 143 mg/dL — ABNORMAL HIGH (ref 70–99)
Glucose-Capillary: 225 mg/dL — ABNORMAL HIGH (ref 70–99)

## 2014-05-04 LAB — BASIC METABOLIC PANEL
ANION GAP: 14 (ref 5–15)
BUN: 27 mg/dL — ABNORMAL HIGH (ref 6–23)
CALCIUM: 8.6 mg/dL (ref 8.4–10.5)
CO2: 32 mEq/L (ref 19–32)
Chloride: 91 mEq/L — ABNORMAL LOW (ref 96–112)
Creatinine, Ser: 0.97 mg/dL (ref 0.50–1.10)
GFR calc Af Amer: 79 mL/min — ABNORMAL LOW (ref >90)
GFR calc non Af Amer: 68 mL/min — ABNORMAL LOW (ref >90)
GLUCOSE: 83 mg/dL (ref 70–99)
Potassium: 3.3 mEq/L — ABNORMAL LOW (ref 3.7–5.3)
Sodium: 137 mEq/L (ref 137–147)

## 2014-05-04 MED ORDER — IPRATROPIUM-ALBUTEROL 0.5-2.5 (3) MG/3ML IN SOLN
3.0000 mL | Freq: Four times a day (QID) | RESPIRATORY_TRACT | Status: DC | PRN
  Administered 2014-05-04 – 2014-05-05 (×2): 3 mL via RESPIRATORY_TRACT
  Filled 2014-05-04 (×2): qty 3

## 2014-05-04 MED ORDER — HALOPERIDOL 1 MG PO TABS
1.0000 mg | ORAL_TABLET | Freq: Every day | ORAL | Status: DC
  Administered 2014-05-04: 1 mg via ORAL
  Filled 2014-05-04 (×2): qty 1

## 2014-05-04 MED ORDER — POTASSIUM CHLORIDE CRYS ER 20 MEQ PO TBCR
60.0000 meq | EXTENDED_RELEASE_TABLET | Freq: Once | ORAL | Status: AC
  Administered 2014-05-04: 60 meq via ORAL
  Filled 2014-05-04: qty 3

## 2014-05-04 NOTE — Progress Notes (Signed)
Inpatient Diabetes Program Recommendations  AACE/ADA: New Consensus Statement on Inpatient Glycemic Control (2013)  Target Ranges:  Prepandial:   less than 140 mg/dL      Peak postprandial:   less than 180 mg/dL (1-2 hours)      Critically ill patients:  140 - 180 mg/dL   Reason for Assessment:  Results for JANISSE, GHAN (MRN [id number]) as of 05/04/2014 13:03  Ref. Range 05/03/2014 11:22 05/03/2014 16:22 05/03/2014 21:04 05/04/2014 06:17 05/04/2014 11:32  Glucose-Capillary Latest Range: 70-99 mg/dL 114 (H) 272 (H) 298 (H) 75 143 (H)   Note that patient is on PO Prednisone-40 mg daily. Please consider adding Novolog meal coverage 4 units tid with meals to prevent elevated post-prandial CBG's.  Also since fasting CBG's are less than 100 mg/dL, consider decreasing Lantus to 30 units q AM and 25 units q PM.   Thanks, Adah Perl, RN, BC-ADM Inpatient Diabetes Coordinator Pager [phone]

## 2014-05-04 NOTE — Progress Notes (Signed)
[facility] PROGRESS NOTE  [name] GEX:[id number] DOB: 07/22/1966 DOA: 04/30/2014 PCP: Gwendolyn Grant, MD  Assessment/Plan: 1. COPD exacerbation  -Continue prednisone -Continue DUONebs O2 RPN  -Continue Augmentin and follow -Monitor O2 sats she is home O2 dependent  -continue to Mobilize, follow  Active Problems:  2. PULMONARY SARCOIDOSIS  stable  3. HYPERTENSION   Monitor BPs. On diuretics and further treat accordingly  4. OBSTRUCTIVE SLEEP APNEA  Continue CPAP qhs  5. Diabetes type 2, uncontrolled  -Continue Lantus Insulin 30 u BID   and Amaryl Rx  -Per pharmacy Victoza not available in the hospital will place on SSI coverage PRN  last HbA1c 3 weeks ago was 8.0 6. Chronic diastolic congestive heart failure  Continue Demadex, and Spironolactone Rx   7. Constipation  -Continue senna and when necessary Dulcolax  8. Hyperkalemia -resolved off KCl supplements 9. Depression with homicidal in ideation -seen by psych and zoloft dose increased 8/9, and Haldol recommended, will check QTc and start if ok -QTC 473 this a.m. 8/10, okay to continue Haldol but given her hypersomnolence we'll decrease to 1 mg -Continue current dose of Zoloft -Per nursing staff staff psychiatrist states he'll only follow up with patient tomorrow 8/11 10. Hyponatremia -Improved with hydration. 11. Hypokalemia -Replace K. 12. DVT prophylaxis  Lovenox  Code Status: full Family Communication: Sister at bedside  Disposition Plan: To home when medically ready   Consultants:  Psych   Procedures:  None  Antibiotics:  Augmentin started 8/7  HPI/Subjective:  Breathing fine, very somnolent(status post Haldol last PM)  Objective: Filed Vitals:   05/04/14 1441  BP: 128/69  Pulse: 74  Temp: 97.6 F (36.4 C)  Resp: 20    Intake/Output Summary (Last 24 hours) at 05/04/14 1806 Last data filed at 05/04/14 1749  Gross per 24 hour  Intake   1323 ml  Output   6100 ml  Net   -4777 ml   Filed Weights   05/02/14 0508 05/03/14 0521 05/04/14 0102  Weight: 136.3 kg (300 lb 7.8 oz) 135.5 kg (298 lb 11.6 oz) 135.898 kg (299 lb 9.6 oz)    Exam:  General: alert & oriented x 3 In NAD Cardiovascular: RRR, nl S1 s2 Respiratory: Moderate air movement, no crackles or wheezes Abdomen: soft +BS NT/ND, no masses palpable Extremities: No cyanosis and no edema    Data Reviewed: Basic Metabolic Panel:  Recent Labs Lab 04/30/14 0059 04/30/14 0933 05/01/14 0500 05/01/14 1959 05/02/14 0345 05/04/14 0415  NA 136*  --  131*  --  134* 137  K 3.7  --  5.8* 5.0 4.6 3.3*  CL 96  --  91*  --  90* 91*  CO2 25  --  25  --  31 32  GLUCOSE 199*  --  311*  --  352* 83  BUN 16  --  29*  --  35* 27*  CREATININE 1.11* 1.13* 1.28*  --  1.24* 0.97  CALCIUM 8.2*  --  10.1  --  9.4 8.6   Liver Function Tests: No results found for this basename: AST, ALT, ALKPHOS, BILITOT, PROT, ALBUMIN,  in the last 168 hours No results found for this basename: LIPASE, AMYLASE,  in the last 168 hours No results found for this basename: AMMONIA,  in the last 168 hours CBC:  Recent Labs Lab 04/30/14 0059 04/30/14 0933 05/01/14 0500  WBC 7.4 8.3 14.2*  HGB 13.3 13.7 13.9  HCT 41.8 43.3 44.5  MCV 79.8 80.5 82.0  PLT 194 223 237   Cardiac Enzymes: No results found for this basename: CKTOTAL, CKMB, CKMBINDEX, TROPONINI,  in the last 168 hours BNP (last 3 results)  Recent Labs  01/18/14 2034 02/11/14 0021 04/17/14 0120  PROBNP 802.5* 32.8 44.7   CBG:  Recent Labs Lab 05/03/14 1622 05/03/14 2104 05/04/14 0617 05/04/14 1132 05/04/14 1622  GLUCAP 272* 298* 75 143* 256*    No results found for this or any previous visit (from the past 240 hour(s)).   Studies: No results found.  Scheduled Meds: . ALPRAZolam  1 mg Oral TID  . amoxicillin-clavulanate  1 tablet Oral Q12H  . aspirin EC  81 mg Oral Daily  . enoxaparin (LOVENOX) injection  70 mg Subcutaneous Q24H  . glimepiride   1 mg Oral QAC breakfast  . haloperidol  2 mg Oral QHS  . insulin aspart  0-20 Units Subcutaneous TID WC  . insulin aspart  0-5 Units Subcutaneous QHS  . insulin glargine  30 Units Subcutaneous BID  . Liraglutide  0.6 Units Subcutaneous Daily  . loratadine  10 mg Oral Daily  . nicotine  14 mg Transdermal Daily  . pantoprazole  40 mg Oral BID  . predniSONE  40 mg Oral Q breakfast  . senna  2 tablet Oral QHS  . sertraline  150 mg Oral Daily  . sodium chloride  3 mL Intravenous Q12H  . spironolactone  25 mg Oral BID  . torsemide  100 mg Oral BID   Continuous Infusions: . sodium chloride Stopped (04/30/14 0845)    Principal Problem:   COPD exacerbation Active Problems:   PULMONARY SARCOIDOSIS   HYPERTENSION   OBSTRUCTIVE SLEEP APNEA   Diabetes type 2, uncontrolled   Asthma exacerbation   Chronic diastolic congestive heart failure    Time spent:35    [name]  [facility] Pager [phone] If 7PM-7AM, please contact night-coverage at [email or web address], password [facility] 05/04/2014, 6:06 PM  LOS: 4 days

## 2014-05-04 NOTE — Progress Notes (Signed)
UR completed Janine Reller K. Jennings Stirling, RN, BSN, Horace, CCM  05/04/2014 2:28 PM

## 2014-05-04 NOTE — Progress Notes (Signed)
Patient had c/o bilateral leg pain earlier in shift. PRN Oxy administered with relief.  First dose of scheduled Haldol given.  Patient tolerating well and remains pleasant and currently denies having any pain or other complaints.  Patient, however, did have a CBG of 298 tonight and currently has a 2L of regular Coke beside which she is drinking even after being educated about her CBG and fluid restrictions while in the hospital.  Patient stated that she "needs it".

## 2014-05-05 DIAGNOSIS — F332 Major depressive disorder, recurrent severe without psychotic features: Secondary | ICD-10-CM

## 2014-05-05 DIAGNOSIS — F331 Major depressive disorder, recurrent, moderate: Secondary | ICD-10-CM

## 2014-05-05 LAB — GLUCOSE, CAPILLARY
GLUCOSE-CAPILLARY: 131 mg/dL — AB (ref 70–99)
Glucose-Capillary: 182 mg/dL — ABNORMAL HIGH (ref 70–99)

## 2014-05-05 LAB — BASIC METABOLIC PANEL
ANION GAP: 14 (ref 5–15)
BUN: 24 mg/dL — ABNORMAL HIGH (ref 6–23)
CALCIUM: 8.6 mg/dL (ref 8.4–10.5)
CO2: 32 meq/L (ref 19–32)
Chloride: 93 mEq/L — ABNORMAL LOW (ref 96–112)
Creatinine, Ser: 0.96 mg/dL (ref 0.50–1.10)
GFR calc Af Amer: 80 mL/min — ABNORMAL LOW (ref >90)
GFR, EST NON AFRICAN AMERICAN: 69 mL/min — AB (ref >90)
Glucose, Bld: 136 mg/dL — ABNORMAL HIGH (ref 70–99)
Potassium: 3.2 mEq/L — ABNORMAL LOW (ref 3.7–5.3)
Sodium: 139 mEq/L (ref 137–147)

## 2014-05-05 MED ORDER — SERTRALINE HCL 100 MG PO TABS: 150.0000 mg | ORAL_TABLET | Freq: Every day | ORAL | Status: DC

## 2014-05-05 MED ORDER — POTASSIUM CHLORIDE CRYS ER 20 MEQ PO TBCR: 40.0000 meq | EXTENDED_RELEASE_TABLET | Freq: Two times a day (BID) | ORAL | Status: DC

## 2014-05-05 MED ORDER — AMOXICILLIN-POT CLAVULANATE 875-125 MG PO TABS: 1.0000 | ORAL_TABLET | Freq: Two times a day (BID) | ORAL | Status: DC

## 2014-05-05 MED ORDER — PREDNISONE 20 MG PO TABS: 40.0000 mg | ORAL_TABLET | Freq: Every day | ORAL | Status: DC

## 2014-05-05 MED ORDER — POTASSIUM CHLORIDE CRYS ER 20 MEQ PO TBCR
60.0000 meq | EXTENDED_RELEASE_TABLET | Freq: Once | ORAL | Status: DC
  Filled 2014-05-05: qty 3

## 2014-05-05 MED ORDER — HALOPERIDOL 1 MG PO TABS: 1.5000 mg | ORAL_TABLET | Freq: Every day | ORAL | Status: DC

## 2014-05-05 NOTE — Discharge Summary (Signed)
Physician Discharge Summary  [name] DGU:[id number] DOB: Jun 14, 1966 DOA: 04/30/2014  PCP: Gwendolyn Grant, MD  Admit date: 04/30/2014 Discharge date: 05/05/2014  Time spent: >7minutes  Recommendations for Outpatient Follow-up:  Follow-up Information   Follow up with Gwendolyn Grant, MD. (in 1week, call for appt upon discharge)    Specialty:  Internal Medicine   Contact information:   [address] [place] [address] [phone]       Please follow up. (Follow up outpt for counseling and medication management>> Soc worker to assist with setting up.)      Followup labs PCP -Bmet, follow up on K.  Discharge Diagnoses:  Principal Problem:   COPD exacerbation Active Problems:   PULMONARY SARCOIDOSIS   HYPERTENSION   OBSTRUCTIVE SLEEP APNEA   Diabetes type 2, uncontrolled   Asthma exacerbation   Chronic diastolic congestive heart failure   Discharge Condition: Improved/stable  Diet recommendation: Modified carbohydrate  Filed Weights   05/03/14 0521 05/04/14 8841 05/05/14 0537  Weight: 135.5 kg (298 lb 11.6 oz) 135.898 kg (299 lb 9.6 oz) 134.7 kg (296 lb 15.4 oz)    History of present illness:  Patient is a 47 y.o. female with a history of COPD, HTN, IDDM, Pulmonary Sarcoidosis who presents to the ED with complaints of worsening SOB and Wheezing with chest tightness over the past 3 days. She reports denies having any chest pain or fevers or chills. She was treated with multiple nebulizer treatements and iv solumedrol with minimal improvement in the ED, and a chest X-Ray was done revealing COPD changes and bibasilar scarring but no acute disease changes. She was admitted for further evaluation and management    Hospital Course:  1. COPD exacerbation  -As discussed above upon admission chest x-ray was negative for acute infiltrates. She was started on nebulized bronchodilators Solu-Medrol and was maintained on supplemental O2  -She  was slow to improve and follow up antibiotics/Augmentin was added  Continue prednisone  -Patient began to improve Solu-Medrol was changed to prednisone, she was mobilized -She is clinically improved at this time breathing at baseline oxygenating well on her baseline home O2 -She is medically stable for discharge at this time and is to followup with her PCP. Active Problems:  2. PULMONARY SARCOIDOSIS  stable  3. HYPERTENSION   On diuretics and further treat accordingly. She is to continue her preadmission meds and follow outpatient  4. OBSTRUCTIVE SLEEP APNEA  Continue CPAP qhs  5. Diabetes type 2, uncontrolled  -Continue Lantus Insulin 30 u BID   as well as Amaryl and the Victoza and follow up outpatient  last HbA1c 3 weeks ago was 8.0  6. Chronic diastolic congestive heart failure  Continue Demadex, and Spironolactone Rx  7. Constipation  - she was placed on a bowel regimen with senna and when necessary Dulcolax  while in the hospital.  8. Hyperkalemia  - potassium supplements were held, and subsequent followup she has been hypokalemic and so she is to resume her supplemental KCL upon discharge and followup with her PCP  9. 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 8/10, okay to continue Haldol but given her hypersomnolence we'll decrease to 1 mg  -Following the decreased to 1 mg an 8/10 patient did not sleep overnight this on followup for a psych today and dose has been changed  to 1.5mg  and she is to follow up outpatient social worker to assist to set up followup for counseling and medication management  -She is to continue Zoloft 150 mg daily upon discharge  10. Hyponatremia  -Improved with hydration.  11. Hypokalemia  He was replaced in the hospital and she is to continue supplemental potassium upon discharge and followup with her  PCP  Procedures:  None  Consultations:  Psychiatry  Discharge Exam: Filed Vitals:   05/05/14 0537  BP: 108/89  Pulse: 76  Temp: 97.5 F (36.4 C)  Resp: 21   Exam:  General: alert & oriented x 3 In NAD  Cardiovascular: RRR, nl S1 s2  Respiratory: Moderate air movement, no crackles or wheezes  Abdomen: soft +BS NT/ND, no masses palpable  Extremities: No cyanosis and no edema    Discharge Instructions You were cared for by a hospitalist during your hospital stay. If you have any questions about your discharge medications or the care you received while you were in the hospital after you are discharged, you can call the unit and asked to speak with the hospitalist on call if the hospitalist that took care of you is not available. Once you are discharged, your primary care physician will handle any further medical issues. Please note that NO REFILLS for any discharge medications will be authorized once you are discharged, as it is imperative that you return to your primary care physician (or establish a relationship with a primary care physician if you do not have one) for your aftercare needs so that they can reassess your need for medications and monitor your lab values.  Discharge Instructions   Diet Carb Modified    Complete by:  As directed      Increase activity slowly    Complete by:  As directed             Medication List         albuterol (2.5 MG/3ML) 0.083% nebulizer solution  Commonly known as:  PROVENTIL  Take 2.5 mg by nebulization every 6 (six) hours as needed for wheezing.     albuterol 108 (90 BASE) MCG/ACT inhaler  Commonly known as:  PROVENTIL HFA;VENTOLIN HFA  Inhale 2 puffs into the lungs every 6 (six) hours as needed for wheezing or shortness of breath.     ALPRAZolam 1 MG tablet  Commonly known as:  XANAX  Take 1 mg by mouth 3 (three) times daily.     amoxicillin-clavulanate 875-125 MG per tablet  Commonly known as:  AUGMENTIN  Take 1 tablet  by mouth every 12 (twelve) hours.     aspirin EC 81 MG tablet  Take 81 mg by mouth daily.     budesonide-formoterol 160-4.5 MCG/ACT inhaler  Commonly known as:  SYMBICORT  Inhale 2 puffs into the lungs 2 (two) times daily.     cetirizine 1 MG/ML syrup  Commonly known as:  ZYRTEC  Take 10 mg by mouth at bedtime.     esomeprazole 40 MG capsule  Commonly known as:  NEXIUM  Take 40 mg by mouth 2 (two) times daily before a meal.     NEXIUM 40 MG capsule  Generic drug:  esomeprazole  TAKE ONE CAPSULE BY MOUTH TWICE DAILY     fluticasone 50 MCG/ACT nasal spray  Commonly known as:  FLONASE  Place 2 sprays into both nostrils daily as needed for allergies or rhinitis.     gabapentin 300 MG capsule  Commonly known as:  NEURONTIN  Take 300 mg by mouth at bedtime as needed (pain).     glimepiride 1 MG tablet  Commonly known as:  AMARYL  Take 1 mg by mouth daily before breakfast.     haloperidol 1 MG tablet  Commonly known as:  HALDOL  Take 1.5 tablets (1.5 mg total) by mouth at bedtime.     hydrOXYzine 10 MG tablet  Commonly known as:  ATARAX/VISTARIL  Take 10 mg by mouth 3 (three) times daily as needed for itching.     LANTUS SOLOSTAR 100 UNIT/ML Solostar Pen  Generic drug:  Insulin Glargine  Inject 30 Units into the skin 2 (two) times daily.     nicotine 14 mg/24hr patch  Commonly known as:  NICODERM CQ - dosed in mg/24 hours  Place 14 mg onto the skin daily.     Oxycodone HCl 10 MG Tabs  Take 10 mg by mouth 3 (three) times daily as needed (severe pain).     potassium chloride SA 20 MEQ tablet  Commonly known as:  K-DUR,KLOR-CON  Take 2 tablets (40 mEq total) by mouth 2 (two) times daily.     predniSONE 20 MG tablet  Commonly known as:  DELTASONE  Take 2 tablets (40 mg total) by mouth daily with breakfast.     promethazine 25 MG tablet  Commonly known as:  PHENERGAN  Take 25 mg by mouth every 6 (six) hours as needed for nausea or vomiting.     sertraline 150 MG  tablet  Commonly known as:  ZOLOFT  Take 150 mg by mouth daily.     spironolactone 25 MG tablet  Commonly known as:  ALDACTONE  Take 25 mg by mouth 2 (two) times daily.     tiotropium 18 MCG inhalation capsule  Commonly known as:  SPIRIVA  Place 18 mcg into inhaler and inhale 2 (two) times daily.     tiZANidine 4 MG tablet  Commonly known as:  ZANAFLEX  Take 4 mg by mouth every 8 (eight) hours as needed for muscle spasms.     tiZANidine 4 MG tablet  Commonly known as:  ZANAFLEX  TAKE 1 TABLET BY MOUTH EVERY 8 HOURS AS NEEDED FOR MUSCLE SPASM     torsemide 100 MG tablet  Commonly known as:  DEMADEX  Take 100 mg by mouth 2 (two) times daily.     varenicline 1 MG tablet  Commonly known as:  CHANTIX CONTINUING MONTH PAK  Take 1 tablet (1 mg total) by mouth 2 (two) times daily.     VICTOZA 18 MG/3ML Sopn  Generic drug:  Liraglutide  Inject 0.6 Units into the skin daily.       Allergies  Allergen Reactions  . Simvastatin Other (See Comments)    Severe leg pain and burning  . Sulfamethoxazole-Trimethoprim Nausea And Vomiting    Causes Projectile Vomiting  . Zolpidem Tartrate Other (See Comments)    Chest pain  . Azithromycin Other (See Comments)    Resistant to med  . Doxycycline Other (See Comments)    Resistant to med  . Latex Itching and Rash    Also burning sensations  . Metformin And Related Diarrhea    Severe diarrhea  . Metronidazole Nausea And Vomiting  . Metolazone Other (See Comments)    Cramps,pain  . Ciprofloxacin Nausea Only      The results of significant diagnostics from this hospitalization (including imaging, microbiology, ancillary and laboratory) are listed below for reference.    Significant Diagnostic Studies:  Dg Chest 2 View  04/29/2014   CLINICAL DATA:  Asthma and cough.  EXAM: CHEST  2 VIEW  COMPARISON:  04/17/2014  FINDINGS: Borderline cardiomegaly. The hila are prominent due to enlargement of the pulmonary arterial tree. There is chronic  interstitial coarsening, especially at the bases. Recent characterization by CT 01/19/2014. No evidence of superimposed edema or pneumonia. No effusion or pneumothorax.  IMPRESSION: COPD with bibasilar scarring. No evidence of acute superimposed disease.   Electronically Signed   By: Jorje Guild M.D.   On: 04/29/2014 22:02   Dg Chest 2 View  04/17/2014   CLINICAL DATA:  Shortness of breath with chest pain. History of COPD.  EXAM: CHEST  2 VIEW  COMPARISON:  02/11/2014 radiographs.  CT 01/19/2014.  FINDINGS: The heart size and mediastinal contours are stable. There is stable chronic lung disease with emphysema and bibasilar fibrotic changes. No superimposed airspace disease or significant pleural effusion is seen. There is no pneumothorax. The osseous structures appear unchanged.  IMPRESSION: Stable age advanced chronic lung disease with emphysema and basilar scarring. No acute superimposed process.   Electronically Signed   By: Camie Patience M.D.   On: 04/17/2014 01:58    Microbiology: No results found for this or any previous visit (from the past 240 hour(s)).   Labs: Basic Metabolic Panel:  Recent Labs Lab 04/30/14 0059 04/30/14 0933 05/01/14 0500 05/01/14 1959 05/02/14 0345 05/04/14 0415 05/05/14 0522  NA 136*  --  131*  --  134* 137 139  K 3.7  --  5.8* 5.0 4.6 3.3* 3.2*  CL 96  --  91*  --  90* 91* 93*  CO2 25  --  25  --  31 32 32  GLUCOSE 199*  --  311*  --  352* 83 136*  BUN 16  --  29*  --  35* 27* 24*  CREATININE 1.11* 1.13* 1.28*  --  1.24* 0.97 0.96  CALCIUM 8.2*  --  10.1  --  9.4 8.6 8.6   Liver Function Tests: No results found for this basename: AST, ALT, ALKPHOS, BILITOT, PROT, ALBUMIN,  in the last 168 hours No results found for this basename: LIPASE, AMYLASE,  in the last 168 hours No results found for this basename: AMMONIA,  in the last 168 hours CBC:  Recent Labs Lab 04/30/14 0059 04/30/14 0933 05/01/14 0500  WBC 7.4 8.3 14.2*  HGB 13.3 13.7 13.9   HCT 41.8 43.3 44.5  MCV 79.8 80.5 82.0  PLT 194 223 237   Cardiac Enzymes: No results found for this basename: CKTOTAL, CKMB, CKMBINDEX, TROPONINI,  in the last 168 hours BNP: BNP (last 3 results)  Recent Labs  01/18/14 2034 02/11/14 0021 04/17/14 0120  PROBNP 802.5* 32.8 44.7   CBG:  Recent Labs Lab 05/04/14 1132 05/04/14 1622 05/04/14 2125 05/05/14 0554 05/05/14 1203  GLUCAP 143* 256* 225* 182* 131*       Signed:  Yiannis Tulloch C  [facility] 05/05/2014, 1:35 PM

## 2014-05-05 NOTE — Progress Notes (Signed)
Discharged home with discharge instructions.
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
## 2014-05-05 NOTE — Progress Notes (Signed)
Provided pt with OP BH resources.  Pt appreciative.

## 2014-05-08 ENCOUNTER — Other Ambulatory Visit: Payer: Self-pay | Admitting: Pulmonary Disease

## 2014-05-12 ENCOUNTER — Ambulatory Visit: Payer: Self-pay | Admitting: Internal Medicine

## 2014-05-12 ENCOUNTER — Telehealth: Payer: Self-pay | Admitting: Internal Medicine

## 2014-05-12 DIAGNOSIS — R4689 Other symptoms and signs involving appearance and behavior: Secondary | ICD-10-CM | POA: Insufficient documentation

## 2014-05-12 NOTE — Telephone Encounter (Signed)
Patient no showed for 1 week hospital fu on 05/12/2014 with Dr. Linna Darner.  Next appt with Dr. Asa Lente is 10/21.  Please advise.

## 2014-05-13 ENCOUNTER — Other Ambulatory Visit: Payer: Self-pay | Admitting: Internal Medicine

## 2014-05-14 ENCOUNTER — Telehealth: Payer: Self-pay | Admitting: *Deleted

## 2014-05-14 ENCOUNTER — Encounter: Payer: Self-pay | Admitting: Internal Medicine

## 2014-05-14 NOTE — Telephone Encounter (Signed)
Left msg on triage Wednesday afternoon @ 5:15pm stating would like md to give her rx for the cough syrup she was given while she was in the hospital. Still have cough especially at night. Called pt back no answer LMOM must be seen b4 med can be prescribe. On tues she no showed hospital f/u with dr. Linna Darner pls call back to reschedule appt...[name]

## 2014-05-18 ENCOUNTER — Encounter: Payer: Self-pay | Admitting: Internal Medicine

## 2014-05-21 ENCOUNTER — Encounter: Payer: Self-pay | Admitting: *Deleted

## 2014-05-22 ENCOUNTER — Ambulatory Visit: Payer: Self-pay | Admitting: Internal Medicine

## 2014-05-25 ENCOUNTER — Telehealth: Payer: Self-pay | Admitting: Internal Medicine

## 2014-05-25 NOTE — Telephone Encounter (Signed)
Pt states she was advised by Dr. Asa Lente to call for referral to psychiatry via My Chart.

## 2014-05-26 ENCOUNTER — Telehealth: Payer: Self-pay

## 2014-05-26 ENCOUNTER — Ambulatory Visit: Payer: Self-pay | Admitting: Internal Medicine

## 2014-05-26 NOTE — Telephone Encounter (Signed)
Pt does not want to see a psychiatrist. Pt stated she could wait until OV with PCP

## 2014-05-26 NOTE — Telephone Encounter (Signed)
Had question about referral... Need a dx code

## 2014-05-26 NOTE — Telephone Encounter (Signed)
Pt states she's returning your call. She would like a callback on her home phone.

## 2014-05-26 NOTE — Telephone Encounter (Signed)
Sent message via mychart

## 2014-05-27 ENCOUNTER — Telehealth: Payer: Self-pay | Admitting: Internal Medicine

## 2014-05-27 MED ORDER — OXYCODONE HCL 10 MG PO TABS: 10.0000 mg | ORAL_TABLET | Freq: Three times a day (TID) | ORAL | Status: DC | PRN

## 2014-05-27 NOTE — Telephone Encounter (Signed)
Called the patient informed to pickup hardcopy at the front desk. 

## 2014-05-27 NOTE — Telephone Encounter (Signed)
Done hardcopy to robin  

## 2014-05-27 NOTE — Telephone Encounter (Signed)
Pt called in about needing a refill on her Oxycodone 10Mg  Tabs.  She last got them filled 04/29/2014.  She was inquiring if she could get them without another appt?    Thank You

## 2014-05-29 ENCOUNTER — Other Ambulatory Visit: Payer: Self-pay | Admitting: Internal Medicine

## 2014-06-01 ENCOUNTER — Other Ambulatory Visit: Payer: Self-pay | Admitting: Internal Medicine

## 2014-06-03 ENCOUNTER — Other Ambulatory Visit: Payer: Self-pay | Admitting: Internal Medicine

## 2014-06-07 ENCOUNTER — Emergency Department (HOSPITAL_COMMUNITY): Payer: Medicare Other

## 2014-06-07 ENCOUNTER — Encounter (HOSPITAL_COMMUNITY): Payer: Self-pay | Admitting: Emergency Medicine

## 2014-06-07 ENCOUNTER — Emergency Department (HOSPITAL_COMMUNITY)
Admission: EM | Admit: 2014-06-07 | Discharge: 2014-06-07 | Disposition: A | Discharge status: Home / Self Care | Payer: Medicare Other | Attending: Emergency Medicine | Admitting: Emergency Medicine

## 2014-06-07 DIAGNOSIS — Z792 Long term (current) use of antibiotics: Secondary | ICD-10-CM | POA: Insufficient documentation

## 2014-06-07 DIAGNOSIS — E119 Type 2 diabetes mellitus without complications: Secondary | ICD-10-CM | POA: Diagnosis not present

## 2014-06-07 DIAGNOSIS — M25461 Effusion, right knee: Secondary | ICD-10-CM

## 2014-06-07 DIAGNOSIS — M25469 Effusion, unspecified knee: Secondary | ICD-10-CM | POA: Insufficient documentation

## 2014-06-07 DIAGNOSIS — Z7982 Long term (current) use of aspirin: Secondary | ICD-10-CM | POA: Diagnosis not present

## 2014-06-07 DIAGNOSIS — F411 Generalized anxiety disorder: Secondary | ICD-10-CM | POA: Diagnosis not present

## 2014-06-07 DIAGNOSIS — I10 Essential (primary) hypertension: Secondary | ICD-10-CM | POA: Diagnosis not present

## 2014-06-07 DIAGNOSIS — IMO0001 Reserved for inherently not codable concepts without codable children: Secondary | ICD-10-CM | POA: Insufficient documentation

## 2014-06-07 DIAGNOSIS — M129 Arthropathy, unspecified: Secondary | ICD-10-CM | POA: Diagnosis not present

## 2014-06-07 DIAGNOSIS — Z860100 Personal history of colon polyps, unspecified: Secondary | ICD-10-CM | POA: Insufficient documentation

## 2014-06-07 DIAGNOSIS — Z8742 Personal history of other diseases of the female genital tract: Secondary | ICD-10-CM | POA: Diagnosis not present

## 2014-06-07 DIAGNOSIS — Z79899 Other long term (current) drug therapy: Secondary | ICD-10-CM | POA: Insufficient documentation

## 2014-06-07 DIAGNOSIS — M1990 Unspecified osteoarthritis, unspecified site: Secondary | ICD-10-CM

## 2014-06-07 DIAGNOSIS — F329 Major depressive disorder, single episode, unspecified: Secondary | ICD-10-CM | POA: Diagnosis not present

## 2014-06-07 DIAGNOSIS — Z8619 Personal history of other infectious and parasitic diseases: Secondary | ICD-10-CM | POA: Diagnosis not present

## 2014-06-07 DIAGNOSIS — Z9981 Dependence on supplemental oxygen: Secondary | ICD-10-CM | POA: Insufficient documentation

## 2014-06-07 DIAGNOSIS — J45901 Unspecified asthma with (acute) exacerbation: Principal | ICD-10-CM

## 2014-06-07 DIAGNOSIS — G8929 Other chronic pain: Secondary | ICD-10-CM | POA: Diagnosis not present

## 2014-06-07 DIAGNOSIS — Z91040 Latex allergy status: Secondary | ICD-10-CM | POA: Insufficient documentation

## 2014-06-07 DIAGNOSIS — Z87891 Personal history of nicotine dependence: Secondary | ICD-10-CM | POA: Insufficient documentation

## 2014-06-07 DIAGNOSIS — E785 Hyperlipidemia, unspecified: Secondary | ICD-10-CM | POA: Insufficient documentation

## 2014-06-07 DIAGNOSIS — K219 Gastro-esophageal reflux disease without esophagitis: Secondary | ICD-10-CM | POA: Diagnosis not present

## 2014-06-07 DIAGNOSIS — D649 Anemia, unspecified: Secondary | ICD-10-CM | POA: Diagnosis not present

## 2014-06-07 DIAGNOSIS — F410 Panic disorder [episodic paroxysmal anxiety] without agoraphobia: Secondary | ICD-10-CM | POA: Insufficient documentation

## 2014-06-07 DIAGNOSIS — F3289 Other specified depressive episodes: Secondary | ICD-10-CM | POA: Diagnosis not present

## 2014-06-07 DIAGNOSIS — J441 Chronic obstructive pulmonary disease with (acute) exacerbation: Secondary | ICD-10-CM | POA: Insufficient documentation

## 2014-06-07 DIAGNOSIS — IMO0002 Reserved for concepts with insufficient information to code with codable children: Secondary | ICD-10-CM | POA: Insufficient documentation

## 2014-06-07 DIAGNOSIS — I509 Heart failure, unspecified: Secondary | ICD-10-CM | POA: Insufficient documentation

## 2014-06-07 DIAGNOSIS — R0602 Shortness of breath: Secondary | ICD-10-CM | POA: Diagnosis present

## 2014-06-07 DIAGNOSIS — G4730 Sleep apnea, unspecified: Secondary | ICD-10-CM | POA: Insufficient documentation

## 2014-06-07 LAB — I-STAT CHEM 8, ED
BUN: 13 mg/dL (ref 6–23)
CREATININE: 1.2 mg/dL — AB (ref 0.50–1.10)
Calcium, Ion: 1.09 mmol/L — ABNORMAL LOW (ref 1.12–1.23)
Chloride: 96 mEq/L (ref 96–112)
Glucose, Bld: 170 mg/dL — ABNORMAL HIGH (ref 70–99)
HEMATOCRIT: 49 % — AB (ref 36.0–46.0)
Hemoglobin: 16.7 g/dL — ABNORMAL HIGH (ref 12.0–15.0)
POTASSIUM: 3.4 meq/L — AB (ref 3.7–5.3)
SODIUM: 135 meq/L — AB (ref 137–147)
TCO2: 30 mmol/L (ref 0–100)

## 2014-06-07 LAB — I-STAT TROPONIN, ED: Troponin i, poc: 0 ng/mL (ref 0.00–0.08)

## 2014-06-07 MED ORDER — IBUPROFEN 800 MG PO TABS
800.0000 mg | ORAL_TABLET | Freq: Once | ORAL | Status: AC
  Administered 2014-06-07: 800 mg via ORAL
  Filled 2014-06-07: qty 1

## 2014-06-07 MED ORDER — OXYCODONE-ACETAMINOPHEN 5-325 MG PO TABS: 2.0000 | ORAL_TABLET | Freq: Four times a day (QID) | ORAL | Status: DC | PRN

## 2014-06-07 MED ORDER — MORPHINE SULFATE 4 MG/ML IJ SOLN
6.0000 mg | Freq: Once | INTRAMUSCULAR | Status: AC
  Administered 2014-06-07: 6 mg via INTRAMUSCULAR
  Filled 2014-06-07: qty 2

## 2014-06-07 NOTE — ED Notes (Signed)
Home Health here with walker for pt.  Ace Wrap applied to right knee.

## 2014-06-07 NOTE — ED Notes (Addendum)
Per EMS pt with history of asthma, copd and wears 2L [place] at home, now having shortness of breath reports is worse with exertion, some improvement after neb en route. Anxious as well

## 2014-06-07 NOTE — ED Notes (Signed)
Pt to xray at this time.

## 2014-06-07 NOTE — ED Notes (Signed)
Returned from x-ray pt st's no relief from pain med.

## 2014-06-07 NOTE — ED Provider Notes (Signed)
CSN: [id number]     Arrival date & time 06/07/14  1426 History   First MD Initiated Contact with Patient 06/07/14 1434     Chief Complaint  Patient presents with  . Shortness of Breath     (Consider location/radiation/quality/duration/timing/severity/associated sxs/prior Treatment) HPI Comments: Pt comes in with complaints of right knee pain and sob. Pt states that she has been getting steroid injections in her knee for no cartilage and she states that it has been worse over the last couple of days. Pt denies redness. Does have more swelling then normal. No recent falls. States that he thinks that he is having sob because she can't bare the pain. Denies cp,n/v, fever or cough. States that she has been having panic attacks because of the pain which is not helping her symptoms  The history is provided by the patient. No language interpreter was used.    Past Medical History  Diagnosis Date  . Hypertension   . Hyperlipidemia   . Chronic headache   . Fibromyalgia     daily narcotics  . Anxiety     hx chronic BZ use, stopped 07/2010  . Anemia   . Pulmonary sarcoidosis     unimpressive CT chest 2011  . Colonic polyp   . GERD (gastroesophageal reflux disease)   . ALLERGIC RHINITIS   . Asthma   . CHF (congestive heart failure)     Diastolic with fluid overload, May, 2012, LVEF 60%  . Morbid obesity   . Depression   . Panic attacks   . Diabetes mellitus   . COPD (chronic obstructive pulmonary disease)     on home O2, moderate airflow obstruction, suspect d/t emphysema  . Obesity   . Elevated LFTs 09/2011  . Ovarian cyst   . Chronic back pain   . Sleep apnea      CPAP  . Fatty liver    Past Surgical History  Procedure Laterality Date  . Polypectomy  2011  . Lumbar microdiscectomy  07/06/2011    R L4-5, stern  . Back surgery    . Carpal tunnel release    . Steroid spinal injections    . Hysteroscopy w/d&c N/A 03/25/2013    Procedure: DILATATION AND CURETTAGE /HYSTEROSCOPY;   Surgeon: [name], MD;  Location: [facility];  Service: Gynecology;  Laterality: N/A;  . Dilation and curettage of uterus     Family History  Problem Relation Age of Onset  . Hypertension Mother   . Emphysema Father   . Hypertension Father   . Stomach cancer Father   . Allergies Brother   . Hypertension Brother   . Stomach cancer Brother   . Heart disease Mother   . Heart disease Father   . Heart disease Brother     died age 57 sudden death/MI   History  Substance Use Topics  . Smoking status: Former Smoker -- 0.25 packs/day for 29 years    Types: Cigarettes    Quit date: 01/18/2014  . Smokeless tobacco: Never Used     Comment: Resumed smoking September 2013 --2-3 CIGS DAILY AND E-CIG  . Alcohol Use: Yes     Comment: occasionally   OB History   Grav Para Term Preterm Abortions TAB SAB Ect Mult Living                 Review of Systems  Constitutional: Negative.   Respiratory: Positive for shortness of breath.   Cardiovascular: Negative.  Allergies  Simvastatin; Sulfamethoxazole-trimethoprim; Zolpidem tartrate; Azithromycin; Doxycycline; Latex; Metformin and related; Metronidazole; Metolazone; and Ciprofloxacin  Home Medications   Prior to Admission medications   Medication Sig Start Date End Date Taking? Authorizing Provider  albuterol (PROVENTIL HFA;VENTOLIN HFA) 108 (90 BASE) MCG/ACT inhaler Inhale 2 puffs into the lungs every 6 (six) hours as needed for wheezing or shortness of breath.    Historical Provider, MD  albuterol (PROVENTIL) (2.5 MG/3ML) 0.083% nebulizer solution Take 2.5 mg by nebulization every 6 (six) hours as needed for wheezing.    Historical Provider, MD  ALPRAZolam Duanne Moron) 1 MG tablet Take 1 mg by mouth 3 (three) times daily.    Historical Provider, MD  amoxicillin-clavulanate (AUGMENTIN) 875-125 MG per tablet Take 1 tablet by mouth every 12 (twelve) hours. 05/05/14   Sheila Oats, MD  aspirin EC 81 MG tablet Take 81 mg by mouth daily.     Historical Provider, MD  budesonide-formoterol (SYMBICORT) 160-4.5 MCG/ACT inhaler Inhale 2 puffs into the lungs 2 (two) times daily.    Historical Provider, MD  cetirizine (ZYRTEC) 1 MG/ML syrup Take 10 mg by mouth at bedtime.    Historical Provider, MD  cetirizine (ZYRTEC) 1 MG/ML syrup TAKE 10ML BY MOUTH AT BEDTIME 06/04/14   Rowe Clack, MD  esomeprazole (NEXIUM) 40 MG capsule Take 40 mg by mouth 2 (two) times daily before a meal.    Historical Provider, MD  fluticasone (FLONASE) 50 MCG/ACT nasal spray Place 2 sprays into both nostrils daily as needed for allergies or rhinitis.    Historical Provider, MD  gabapentin (NEURONTIN) 300 MG capsule Take 300 mg by mouth at bedtime as needed (pain).  03/08/14   Historical Provider, MD  glimepiride (AMARYL) 1 MG tablet Take 1 mg by mouth daily before breakfast.    Historical Provider, MD  glimepiride (AMARYL) 1 MG tablet TAKE 1 TABLET BY MOUTH EVERY DAY BEFORE BREAKFAST 05/13/14   Rowe Clack, MD  haloperidol (HALDOL) 1 MG tablet Take 1.5 tablets (1.5 mg total) by mouth at bedtime. 05/05/14   Sheila Oats, MD  hydrOXYzine (ATARAX/VISTARIL) 10 MG tablet Take 10 mg by mouth 3 (three) times daily as needed for itching.    Historical Provider, MD  Insulin Glargine (LANTUS SOLOSTAR) 100 UNIT/ML Solostar Pen Inject 30 Units into the skin 2 (two) times daily.    Historical Provider, MD  LANTUS SOLOSTAR 100 UNIT/ML Solostar Pen INJECT 16 UNITS UNDER THE SKIN EVERY NIGHT AT BEDTIME 06/02/14   Rowe Clack, MD  Liraglutide (VICTOZA) 18 MG/3ML SOPN Inject 0.6 Units into the skin daily.    Historical Provider, MD  NEXIUM 40 MG capsule TAKE ONE CAPSULE BY MOUTH TWICE DAILY    Rowe Clack, MD  nicotine (NICODERM CQ - DOSED IN MG/24 HOURS) 14 mg/24hr patch Place 14 mg onto the skin daily.    Historical Provider, MD  Oxycodone HCl 10 MG TABS Take 1 tablet (10 mg total) by mouth 3 (three) times daily as needed. 05/27/14   Biagio Borg, MD   potassium chloride SA (K-DUR,KLOR-CON) 20 MEQ tablet Take 2 tablets (40 mEq total) by mouth 2 (two) times daily. 05/05/14   Sheila Oats, MD  predniSONE (DELTASONE) 20 MG tablet Take 2 tablets (40 mg total) by mouth daily with breakfast. 05/05/14   Sheila Oats, MD  promethazine (PHENERGAN) 25 MG tablet Take 25 mg by mouth every 6 (six) hours as needed for nausea or vomiting.    Historical Provider,  MD  promethazine (PHENERGAN) 25 MG tablet TAKE 1 TABLET BY MOUTH EVERY 6 HOURS AS NEEDED FOR NAUSEA 06/02/14   Rowe Clack, MD  sertraline (ZOLOFT) 100 MG tablet Take 1.5 tablets (150 mg total) by mouth daily. 05/05/14   Sheila Oats, MD  spironolactone (ALDACTONE) 25 MG tablet Take 25 mg by mouth 2 (two) times daily.    Historical Provider, MD  spironolactone (ALDACTONE) 25 MG tablet TAKE 1 TABLET BY MOUTH TWICE DAILY 05/13/14   Rowe Clack, MD  SYMBICORT 160-4.5 MCG/ACT inhaler INHALE 2 PUFFS BY MOUTH TWICE DAILY 05/08/14   Kathee Delton, MD  tiotropium (SPIRIVA) 18 MCG inhalation capsule Place 18 mcg into inhaler and inhale 2 (two) times daily.     Historical Provider, MD  tiZANidine (ZANAFLEX) 4 MG tablet Take 4 mg by mouth every 8 (eight) hours as needed for muscle spasms.    Historical Provider, MD  tiZANidine (ZANAFLEX) 4 MG tablet TAKE 1 TABLET BY MOUTH EVERY 8 HOURS AS NEEDED FOR MUSCLE SPASM    Rowe Clack, MD  torsemide (DEMADEX) 100 MG tablet Take 100 mg by mouth 2 (two) times daily.    Historical Provider, MD  varenicline (CHANTIX CONTINUING MONTH PAK) 1 MG tablet Take 1 tablet (1 mg total) by mouth 2 (two) times daily. 03/02/14   Rowe Clack, MD  VICTOZA 18 MG/3ML SOPN INJECT 0.6 MG INTO THE SKIN DAILY 06/03/14   Rowe Clack, MD   BP 108/61  Temp(Src) 98.1 F (36.7 C) (Oral)  Resp 19  SpO2 95%  LMP 10/16/2012 Physical Exam  Nursing note and vitals reviewed. Constitutional: She is oriented to person, place, and time. She appears well-developed and  well-nourished.  HENT:  Head: Normocephalic and atraumatic.  Cardiovascular: Normal rate and regular rhythm.   Pulmonary/Chest: Effort normal and breath sounds normal.  Musculoskeletal:  Swelling noted to the right knee. No redness, swelling or warmth.pt has full rom. Pulses intact  Neurological: She is alert and oriented to person, place, and time. Coordination normal.  Skin: Skin is warm and dry.  Psychiatric:  Tearful on exam    ED Course  Procedures (including critical care time) Labs Review Labs Reviewed  I-STAT CHEM 8, ED - Abnormal; Notable for the following:    Sodium 135 (*)    Potassium 3.4 (*)    Creatinine, Ser 1.20 (*)    Glucose, Bld 170 (*)    Calcium, Ion 1.09 (*)    Hemoglobin 16.7 (*)    HCT 49.0 (*)    All other components within normal limits  Randolm Idol, ED    Imaging Review Dg Chest 2 View  06/07/2014   CLINICAL DATA:  Shortness of breath.  History of COPD.  EXAM: CHEST  2 VIEW  COMPARISON:  04/29/2014  FINDINGS: Stable cardiomegaly hand stable mediastinal contours. Slight prominence of the central pulmonary arteries is stable. Flattening of the hemidiaphragms is noted. There is diffuse moderate bronchial wall thickening, similar compared to prior chest radiograph. There are linear areas of scarring at both lung bases, stable. No consolidation, effusion, or pneumothorax. Trachea is midline. No acute osseous abnormality.  IMPRESSION: COPD with chronic basilar scarring. No acute superimposed abnormality.   Electronically Signed   By: Curlene Dolphin M.D.   On: 06/07/2014 15:51   Dg Knee Complete 4 Views Right  06/07/2014   CLINICAL DATA:  Severe RIGHT knee pain. History of injection 3 weeks ago.  EXAM: RIGHT KNEE - COMPLETE 4+  VIEW  COMPARISON:  05/16/2010.  FINDINGS: Mild medial compartment osteoarthritis and mild lateral compartment osteoarthritis. Large effusion is present on the lateral view. Moderate patellofemoral osteoarthritis. No osteolysis. No  fracture. The alignment of the knee is anatomic.  IMPRESSION: Tricompartmental osteoarthritis and large effusion. No acute osseous abnormality.   Electronically Signed   By: Dereck Ligas M.D.   On: 06/07/2014 16:02     EKG Interpretation   Date/Time:  Sunday June 07 2014 14:36:02 EDT Ventricular Rate:  90 PR Interval:  157 QRS Duration: 86 QT Interval:  375 QTC Calculation: 459 R Axis:   26 Text Interpretation:  Sinus or ectopic atrial rhythm Baseline wander When  compared with ECG of 05/04/2014 No significant change was found Confirmed  by [facility]  MD, Nunzio Cory [phone]) on 06/07/2014 2:47:31 PM      MDM   Final diagnoses:  Knee effusion, right  Arthritis  SOB (shortness of breath)    Pt not wheezing on initial presentation and continues to be clear at this time. Pt labs consistent with previous visits. No infection noted on xray. Pt is okay to follow up with ortho as needeg    Glendell Docker, NP 06/07/14 1659

## 2014-06-07 NOTE — Discharge Instructions (Signed)
Knee Effusion The medical term for having fluid in your knee is effusion. This is often due to an internal derangement of the knee. This means something is wrong inside the knee. Some of the causes of fluid in the knee may be torn cartilage, a torn ligament, or bleeding into the joint from an injury. Your knee is likely more difficult to bend and move. This is often because there is increased pain and pressure in the joint. The time it takes for recovery from a knee effusion depends on different factors, including:   Type of injury.  Your age.  Physical and medical conditions.  Rehabilitation Strategies. How long you will be away from your normal activities will depend on what kind of knee problem you have and how much damage is present. Your knee has two types of cartilage. Articular cartilage covers the bone ends and lets your knee bend and move smoothly. Two menisci, thick pads of cartilage that form a rim inside the joint, help absorb shock and stabilize your knee. Ligaments bind the bones together and support your knee joint. Muscles move the joint, help support your knee, and take stress off the joint itself. CAUSES  Often an effusion in the knee is caused by an injury to one of the menisci. This is often a tear in the cartilage. Recovery after a meniscus injury depends on how much meniscus is damaged and whether you have damaged other knee tissue. Small tears may heal on their own with conservative treatment. Conservative means rest, limited weight bearing activity and muscle strengthening exercises. Your recovery may take up to 6 weeks.  TREATMENT  Larger tears may require surgery. Meniscus injuries may be treated during arthroscopy. Arthroscopy is a procedure in which your surgeon uses a small telescope like instrument to look in your knee. Your caregiver can make a more accurate diagnosis (learning what is wrong) by performing an arthroscopic procedure. If your injury is on the inner margin  of the meniscus, your surgeon may trim the meniscus back to a smooth rim. In other cases your surgeon will try to repair a damaged meniscus with stitches (sutures). This may make rehabilitation take longer, but may provide better long term result by helping your knee keep its shock absorption capabilities. Ligaments which are completely torn usually require surgery for repair. HOME CARE INSTRUCTIONS  Use crutches as instructed.  If a brace is applied, use as directed.  Once you are home, an ice pack applied to your swollen knee may help with discomfort and help decrease swelling.  Keep your knee raised (elevated) when you are not up and around or on crutches.  Only take over-the-counter or prescription medicines for pain, discomfort, or fever as directed by your caregiver.  Your caregivers will help with instructions for rehabilitation of your knee. This often includes strengthening exercises.  You may resume a normal diet and activities as directed. SEEK MEDICAL CARE IF:   There is increased swelling in your knee.  You notice redness, swelling, or increasing pain in your knee.  An unexplained oral temperature above 102 F (38.9 C) develops. SEEK IMMEDIATE MEDICAL CARE IF:   You develop a rash.  You have difficulty breathing.  You have any allergic reactions from medications you may have been given.  There is severe pain with any motion of the knee. MAKE SURE YOU:   Understand these instructions.  Will watch your condition.  Will get help right away if you are not doing well or get worse.   Document Released: 12/02/2003 Document Revised: 12/04/2011 Document Reviewed: 02/05/2008 Exit[employer] Patient Information 2015 [employer]. This information is not intended to replace advice given to you by your health care provider. Make sure you discuss any questions you have with your health care provider.  

## 2014-06-09 ENCOUNTER — Other Ambulatory Visit (HOSPITAL_COMMUNITY): Payer: Self-pay | Admitting: Orthopedic Surgery

## 2014-06-09 DIAGNOSIS — M25561 Pain in right knee: Secondary | ICD-10-CM

## 2014-06-09 DIAGNOSIS — S83206A Unspecified tear of unspecified meniscus, current injury, right knee, initial encounter: Secondary | ICD-10-CM

## 2014-06-10 NOTE — ED Provider Notes (Signed)
Medical screening examination/treatment/procedure(s) were performed by non-physician practitioner and as supervising physician I was immediately available for consultation/collaboration.   EKG Interpretation   Date/Time:  Sunday June 07 2014 14:36:02 EDT Ventricular Rate:  90 PR Interval:  157 QRS Duration: 86 QT Interval:  375 QTC Calculation: 459 R Axis:   26 Text Interpretation:  Sinus or ectopic atrial rhythm Baseline wander When  compared with ECG of 05/04/2014 No significant change was found Confirmed  by [facility]  MD, Rami Budhu ([id number]) on 06/07/2014 2:47:31 PM        Francine Graven, DO 06/10/14 2395

## 2014-06-13 ENCOUNTER — Other Ambulatory Visit: Payer: Self-pay | Admitting: Pulmonary Disease

## 2014-06-13 ENCOUNTER — Other Ambulatory Visit: Payer: Self-pay | Admitting: Internal Medicine

## 2014-06-15 ENCOUNTER — Telehealth: Payer: Self-pay | Admitting: Internal Medicine

## 2014-06-15 NOTE — Telephone Encounter (Signed)
Patient is requesting her pharmacy be switched from [facility] to [facility].

## 2014-06-17 ENCOUNTER — Telehealth: Payer: Self-pay | Admitting: Pulmonary Disease

## 2014-06-17 NOTE — Telephone Encounter (Signed)
lmtcb x1 

## 2014-06-18 ENCOUNTER — Other Ambulatory Visit: Payer: Self-pay | Admitting: Internal Medicine

## 2014-06-18 NOTE — Telephone Encounter (Signed)
lmtcb x2 

## 2014-06-19 MED ORDER — BUDESONIDE-FORMOTEROL FUMARATE 160-4.5 MCG/ACT IN AERO: INHALATION_SPRAY | RESPIRATORY_TRACT | Status: DC

## 2014-06-19 NOTE — Telephone Encounter (Signed)
Symbicort rx sent to [facility]. lmomtcb on pt's home and cell #s to inform her of above.

## 2014-06-22 NOTE — Telephone Encounter (Signed)
i have called the number listed and lmom to make the pt aware of rx sent to the pharmacy.

## 2014-06-24 ENCOUNTER — Ambulatory Visit (HOSPITAL_COMMUNITY): Admission: RE | Admit: 2014-06-24 | Payer: Medicare Other | Source: Ambulatory Visit

## 2014-06-24 NOTE — Telephone Encounter (Signed)
Updated pharmacy.../lmb 

## 2014-06-25 ENCOUNTER — Encounter: Payer: Self-pay | Admitting: *Deleted

## 2014-06-30 ENCOUNTER — Telehealth: Payer: Self-pay | Admitting: Internal Medicine

## 2014-06-30 ENCOUNTER — Encounter: Payer: Self-pay | Admitting: Internal Medicine

## 2014-06-30 ENCOUNTER — Other Ambulatory Visit: Payer: Self-pay

## 2014-06-30 MED ORDER — CETIRIZINE HCL 5 MG/5ML PO SYRP: 10.0000 mg | ORAL_SOLUTION | Freq: Every day | ORAL | Status: DC

## 2014-06-30 MED ORDER — TORSEMIDE 100 MG PO TABS: 100.0000 mg | ORAL_TABLET | Freq: Two times a day (BID) | ORAL | Status: DC

## 2014-06-30 MED ORDER — GABAPENTIN 300 MG PO CAPS: 300.0000 mg | ORAL_CAPSULE | Freq: Every evening | ORAL | Status: DC | PRN

## 2014-06-30 NOTE — Telephone Encounter (Signed)
Patient is requesting refills on torsemide, gabapentin, zyrtec syrup, and oxycodone 10mg 

## 2014-07-01 ENCOUNTER — Other Ambulatory Visit: Payer: Self-pay

## 2014-07-01 ENCOUNTER — Other Ambulatory Visit: Payer: Self-pay | Admitting: Internal Medicine

## 2014-07-01 MED ORDER — OXYCODONE HCL 10 MG PO TABS: 10.0000 mg | ORAL_TABLET | Freq: Three times a day (TID) | ORAL | Status: DC | PRN

## 2014-07-01 MED ORDER — OXYCODONE-ACETAMINOPHEN 5-325 MG PO TABS: 2.0000 | ORAL_TABLET | Freq: Four times a day (QID) | ORAL | Status: DC | PRN

## 2014-07-01 NOTE — Addendum Note (Signed)
Addended by: Gwendolyn Grant A on: 07/01/2014 02:18 PM   Modules accepted: Orders

## 2014-07-01 NOTE — Progress Notes (Signed)
Yes, ok to refill - prior to ER visit, I filled rx for #90/mo (look at med hx) thanks

## 2014-07-01 NOTE — Telephone Encounter (Signed)
Patient stated that wrong dosage was called in for oxycodone, patient got disconnected, she didn't tell what the correct dosage was

## 2014-07-01 NOTE — Telephone Encounter (Signed)
Pt called back in wanted to make sure it was the Oxycodone without anything int it.  She said that it was the incorrect one on my chart

## 2014-07-02 ENCOUNTER — Other Ambulatory Visit: Payer: Self-pay

## 2014-07-02 MED ORDER — OXYCODONE HCL 10 MG PO TABS: 10.0000 mg | ORAL_TABLET | Freq: Three times a day (TID) | ORAL | Status: DC | PRN

## 2014-07-02 NOTE — Telephone Encounter (Signed)
Spoke to pt and informed that is was the med without anything in it.

## 2014-07-03 ENCOUNTER — Other Ambulatory Visit: Payer: Self-pay | Admitting: Internal Medicine

## 2014-07-06 ENCOUNTER — Other Ambulatory Visit: Payer: Self-pay | Admitting: Internal Medicine

## 2014-07-09 ENCOUNTER — Other Ambulatory Visit: Payer: Self-pay | Admitting: Internal Medicine

## 2014-07-09 ENCOUNTER — Encounter: Payer: Self-pay | Admitting: Internal Medicine

## 2014-07-09 ENCOUNTER — Ambulatory Visit (HOSPITAL_COMMUNITY): Admission: RE | Admit: 2014-07-09 | Payer: Medicare Other | Source: Ambulatory Visit

## 2014-07-09 DIAGNOSIS — R21 Rash and other nonspecific skin eruption: Secondary | ICD-10-CM

## 2014-07-10 NOTE — Telephone Encounter (Signed)
[place] spoke with Kirke Shaggy she stated they had transferred all pt meds back to [facility].  Called [facility] spoke with Rob/pharmacist gave md authorization...Johny Chess

## 2014-07-13 ENCOUNTER — Ambulatory Visit: Payer: Medicaid Other | Admitting: Internal Medicine

## 2014-07-15 ENCOUNTER — Ambulatory Visit: Payer: Self-pay | Admitting: Pulmonary Disease

## 2014-07-15 ENCOUNTER — Telehealth: Payer: Self-pay

## 2014-07-15 ENCOUNTER — Ambulatory Visit: Payer: Self-pay | Admitting: Internal Medicine

## 2014-07-15 NOTE — Telephone Encounter (Signed)
Pt is requesting a rx for predisone for a rash that itches. Pt is waiting for approval to see dermatologist.

## 2014-07-16 ENCOUNTER — Telehealth: Payer: Self-pay

## 2014-07-16 NOTE — Telephone Encounter (Signed)
Do not think that sounds appropriate. If she is having a troublesome rash she likely needs to see someone for it here prior to derm apt.

## 2014-07-16 NOTE — Telephone Encounter (Signed)
Informed pt of Dr. Donneta Romberg response (not refilling the Prednisone.

## 2014-07-20 ENCOUNTER — Encounter (HOSPITAL_COMMUNITY): Payer: Self-pay | Admitting: Emergency Medicine

## 2014-07-20 ENCOUNTER — Inpatient Hospital Stay (HOSPITAL_COMMUNITY)
Admission: EM | Admit: 2014-07-20 | Discharge: 2014-07-24 | DRG: 190 | Disposition: A | Discharge status: Home / Self Care | Payer: PRIVATE HEALTH INSURANCE | Attending: Internal Medicine | Admitting: Internal Medicine

## 2014-07-20 ENCOUNTER — Emergency Department (HOSPITAL_COMMUNITY): Payer: PRIVATE HEALTH INSURANCE

## 2014-07-20 DIAGNOSIS — IMO0002 Reserved for concepts with insufficient information to code with codable children: Secondary | ICD-10-CM

## 2014-07-20 DIAGNOSIS — Z888 Allergy status to other drugs, medicaments and biological substances status: Secondary | ICD-10-CM

## 2014-07-20 DIAGNOSIS — D860 Sarcoidosis of lung: Secondary | ICD-10-CM | POA: Diagnosis present

## 2014-07-20 DIAGNOSIS — I10 Essential (primary) hypertension: Secondary | ICD-10-CM | POA: Diagnosis present

## 2014-07-20 DIAGNOSIS — E119 Type 2 diabetes mellitus without complications: Secondary | ICD-10-CM | POA: Diagnosis present

## 2014-07-20 DIAGNOSIS — J45909 Unspecified asthma, uncomplicated: Secondary | ICD-10-CM | POA: Diagnosis present

## 2014-07-20 DIAGNOSIS — Z23 Encounter for immunization: Secondary | ICD-10-CM

## 2014-07-20 DIAGNOSIS — F1410 Cocaine abuse, uncomplicated: Secondary | ICD-10-CM | POA: Diagnosis present

## 2014-07-20 DIAGNOSIS — Z9981 Dependence on supplemental oxygen: Secondary | ICD-10-CM

## 2014-07-20 DIAGNOSIS — J9620 Acute and chronic respiratory failure, unspecified whether with hypoxia or hypercapnia: Secondary | ICD-10-CM | POA: Diagnosis present

## 2014-07-20 DIAGNOSIS — R079 Chest pain, unspecified: Secondary | ICD-10-CM | POA: Diagnosis present

## 2014-07-20 DIAGNOSIS — R4689 Other symptoms and signs involving appearance and behavior: Secondary | ICD-10-CM | POA: Diagnosis present

## 2014-07-20 DIAGNOSIS — J9610 Chronic respiratory failure, unspecified whether with hypoxia or hypercapnia: Secondary | ICD-10-CM | POA: Diagnosis present

## 2014-07-20 DIAGNOSIS — R0602 Shortness of breath: Secondary | ICD-10-CM | POA: Diagnosis not present

## 2014-07-20 DIAGNOSIS — J441 Chronic obstructive pulmonary disease with (acute) exacerbation: Secondary | ICD-10-CM | POA: Diagnosis not present

## 2014-07-20 DIAGNOSIS — J45901 Unspecified asthma with (acute) exacerbation: Secondary | ICD-10-CM | POA: Diagnosis present

## 2014-07-20 DIAGNOSIS — Z881 Allergy status to other antibiotic agents status: Secondary | ICD-10-CM

## 2014-07-20 DIAGNOSIS — F17210 Nicotine dependence, cigarettes, uncomplicated: Secondary | ICD-10-CM | POA: Diagnosis present

## 2014-07-20 DIAGNOSIS — D869 Sarcoidosis, unspecified: Secondary | ICD-10-CM | POA: Diagnosis present

## 2014-07-20 DIAGNOSIS — Z7982 Long term (current) use of aspirin: Secondary | ICD-10-CM

## 2014-07-20 DIAGNOSIS — M549 Dorsalgia, unspecified: Secondary | ICD-10-CM | POA: Diagnosis present

## 2014-07-20 DIAGNOSIS — Z825 Family history of asthma and other chronic lower respiratory diseases: Secondary | ICD-10-CM

## 2014-07-20 DIAGNOSIS — Z882 Allergy status to sulfonamides status: Secondary | ICD-10-CM

## 2014-07-20 DIAGNOSIS — E669 Obesity, unspecified: Secondary | ICD-10-CM | POA: Diagnosis present

## 2014-07-20 DIAGNOSIS — Z8249 Family history of ischemic heart disease and other diseases of the circulatory system: Secondary | ICD-10-CM

## 2014-07-20 DIAGNOSIS — G4733 Obstructive sleep apnea (adult) (pediatric): Secondary | ICD-10-CM | POA: Diagnosis present

## 2014-07-20 DIAGNOSIS — E785 Hyperlipidemia, unspecified: Secondary | ICD-10-CM | POA: Diagnosis present

## 2014-07-20 DIAGNOSIS — Z794 Long term (current) use of insulin: Secondary | ICD-10-CM

## 2014-07-20 DIAGNOSIS — E1165 Type 2 diabetes mellitus with hyperglycemia: Secondary | ICD-10-CM

## 2014-07-20 DIAGNOSIS — M25569 Pain in unspecified knee: Secondary | ICD-10-CM

## 2014-07-20 DIAGNOSIS — F1210 Cannabis abuse, uncomplicated: Secondary | ICD-10-CM | POA: Diagnosis present

## 2014-07-20 DIAGNOSIS — Z6841 Body Mass Index (BMI) 40.0 and over, adult: Secondary | ICD-10-CM

## 2014-07-20 DIAGNOSIS — Z800 Family history of malignant neoplasm of digestive organs: Secondary | ICD-10-CM

## 2014-07-20 DIAGNOSIS — Z7952 Long term (current) use of systemic steroids: Secondary | ICD-10-CM

## 2014-07-20 DIAGNOSIS — G8929 Other chronic pain: Secondary | ICD-10-CM | POA: Diagnosis present

## 2014-07-20 DIAGNOSIS — Z720 Tobacco use: Secondary | ICD-10-CM

## 2014-07-20 DIAGNOSIS — J9621 Acute and chronic respiratory failure with hypoxia: Secondary | ICD-10-CM | POA: Diagnosis present

## 2014-07-20 DIAGNOSIS — I5032 Chronic diastolic (congestive) heart failure: Secondary | ICD-10-CM | POA: Diagnosis present

## 2014-07-20 DIAGNOSIS — E662 Morbid (severe) obesity with alveolar hypoventilation: Secondary | ICD-10-CM

## 2014-07-20 DIAGNOSIS — Z9119 Patient's noncompliance with other medical treatment and regimen: Secondary | ICD-10-CM | POA: Diagnosis present

## 2014-07-20 DIAGNOSIS — M797 Fibromyalgia: Secondary | ICD-10-CM | POA: Diagnosis present

## 2014-07-20 LAB — COMPREHENSIVE METABOLIC PANEL
ALBUMIN: 3.8 g/dL (ref 3.5–5.2)
ALK PHOS: 121 U/L — AB (ref 39–117)
ALT: 25 U/L (ref 0–35)
AST: 20 U/L (ref 0–37)
Anion gap: 16 — ABNORMAL HIGH (ref 5–15)
BILIRUBIN TOTAL: 0.8 mg/dL (ref 0.3–1.2)
BUN: 13 mg/dL (ref 6–23)
CHLORIDE: 96 meq/L (ref 96–112)
CO2: 24 mEq/L (ref 19–32)
Calcium: 9 mg/dL (ref 8.4–10.5)
Creatinine, Ser: 1.11 mg/dL — ABNORMAL HIGH (ref 0.50–1.10)
GFR calc Af Amer: 67 mL/min — ABNORMAL LOW (ref >90)
GFR calc non Af Amer: 58 mL/min — ABNORMAL LOW (ref >90)
Glucose, Bld: 152 mg/dL — ABNORMAL HIGH (ref 70–99)
POTASSIUM: 4 meq/L (ref 3.7–5.3)
SODIUM: 136 meq/L — AB (ref 137–147)
TOTAL PROTEIN: 8.6 g/dL — AB (ref 6.0–8.3)

## 2014-07-20 LAB — CBC WITH DIFFERENTIAL/PLATELET
BASOS ABS: 0 10*3/uL (ref 0.0–0.1)
BASOS PCT: 0 % (ref 0–1)
EOS ABS: 0.1 10*3/uL (ref 0.0–0.7)
Eosinophils Relative: 1 % (ref 0–5)
HCT: 42 % (ref 36.0–46.0)
Hemoglobin: 13.6 g/dL (ref 12.0–15.0)
Lymphocytes Relative: 6 % — ABNORMAL LOW (ref 12–46)
Lymphs Abs: 0.5 10*3/uL — ABNORMAL LOW (ref 0.7–4.0)
MCH: 23.7 pg — AB (ref 26.0–34.0)
MCHC: 32.4 g/dL (ref 30.0–36.0)
MCV: 73 fL — ABNORMAL LOW (ref 78.0–100.0)
Monocytes Absolute: 0.3 10*3/uL (ref 0.1–1.0)
Monocytes Relative: 3 % (ref 3–12)
NEUTROS ABS: 7.7 10*3/uL (ref 1.7–7.7)
NEUTROS PCT: 90 % — AB (ref 43–77)
PLATELETS: 188 10*3/uL (ref 150–400)
RBC: 5.75 MIL/uL — ABNORMAL HIGH (ref 3.87–5.11)
RDW: 18 % — AB (ref 11.5–15.5)
WBC: 8.5 10*3/uL (ref 4.0–10.5)

## 2014-07-20 LAB — TROPONIN I
Troponin I: <0.3 ng/mL (ref <0.30)
Troponin I: <0.3 ng/mL (ref <0.30)

## 2014-07-20 LAB — PRO B NATRIURETIC PEPTIDE: Pro B Natriuretic peptide (BNP): 39.7 pg/mL (ref 0–125)

## 2014-07-20 MED ORDER — HYDROMORPHONE HCL 1 MG/ML IJ SOLN
0.5000 mg | Freq: Once | INTRAMUSCULAR | Status: AC
  Administered 2014-07-20: 0.5 mg via INTRAVENOUS
  Filled 2014-07-20: qty 1

## 2014-07-20 MED ORDER — METHYLPREDNISOLONE SODIUM SUCC 125 MG IJ SOLR
80.0000 mg | Freq: Two times a day (BID) | INTRAMUSCULAR | Status: DC
  Administered 2014-07-21 (×3): 80 mg via INTRAVENOUS
  Filled 2014-07-20: qty 2
  Filled 2014-07-20 (×2): qty 1.28
  Filled 2014-07-20: qty 2
  Filled 2014-07-20 (×3): qty 1.28

## 2014-07-20 MED ORDER — SERTRALINE HCL 100 MG PO TABS
100.0000 mg | ORAL_TABLET | Freq: Every day | ORAL | Status: DC
  Administered 2014-07-21 – 2014-07-24 (×4): 100 mg via ORAL
  Filled 2014-07-20 (×4): qty 1

## 2014-07-20 MED ORDER — TORSEMIDE 100 MG PO TABS
100.0000 mg | ORAL_TABLET | Freq: Two times a day (BID) | ORAL | Status: DC
  Administered 2014-07-21 – 2014-07-24 (×8): 100 mg via ORAL
  Filled 2014-07-20 (×9): qty 1

## 2014-07-20 MED ORDER — SODIUM CHLORIDE 0.9 % IJ SOLN: 3.0000 mL | INTRAMUSCULAR | Status: DC | PRN

## 2014-07-20 MED ORDER — BUDESONIDE-FORMOTEROL FUMARATE 160-4.5 MCG/ACT IN AERO
2.0000 | INHALATION_SPRAY | Freq: Two times a day (BID) | RESPIRATORY_TRACT | Status: DC
  Filled 2014-07-20: qty 6

## 2014-07-20 MED ORDER — ALBUTEROL SULFATE (2.5 MG/3ML) 0.083% IN NEBU
2.5000 mg | INHALATION_SOLUTION | Freq: Once | RESPIRATORY_TRACT | Status: AC
  Administered 2014-07-20: 2.5 mg via RESPIRATORY_TRACT
  Filled 2014-07-20: qty 3

## 2014-07-20 MED ORDER — TIOTROPIUM BROMIDE MONOHYDRATE 18 MCG IN CAPS
18.0000 ug | ORAL_CAPSULE | Freq: Two times a day (BID) | RESPIRATORY_TRACT | Status: DC
  Administered 2014-07-21: 18 ug via RESPIRATORY_TRACT
  Filled 2014-07-20: qty 5

## 2014-07-20 MED ORDER — SPIRONOLACTONE 25 MG PO TABS
25.0000 mg | ORAL_TABLET | Freq: Two times a day (BID) | ORAL | Status: DC
  Administered 2014-07-21: 25 mg via ORAL
  Filled 2014-07-20 (×3): qty 1

## 2014-07-20 MED ORDER — TIZANIDINE HCL 4 MG PO TABS
4.0000 mg | ORAL_TABLET | Freq: Three times a day (TID) | ORAL | Status: DC | PRN
  Administered 2014-07-21: 4 mg via ORAL
  Filled 2014-07-20 (×2): qty 1

## 2014-07-20 MED ORDER — ALPRAZOLAM 0.5 MG PO TABS
1.0000 mg | ORAL_TABLET | Freq: Three times a day (TID) | ORAL | Status: DC
  Administered 2014-07-21 – 2014-07-24 (×11): 1 mg via ORAL
  Filled 2014-07-20 (×12): qty 2

## 2014-07-20 MED ORDER — GLIMEPIRIDE 1 MG PO TABS
1.0000 mg | ORAL_TABLET | Freq: Every day | ORAL | Status: DC
  Administered 2014-07-21 – 2014-07-24 (×4): 1 mg via ORAL
  Filled 2014-07-20 (×5): qty 1

## 2014-07-20 MED ORDER — GABAPENTIN 300 MG PO CAPS
300.0000 mg | ORAL_CAPSULE | Freq: Every day | ORAL | Status: DC
  Administered 2014-07-21 – 2014-07-23 (×3): 300 mg via ORAL
  Filled 2014-07-20 (×5): qty 1

## 2014-07-20 MED ORDER — VARENICLINE TARTRATE 1 MG PO TABS
1.0000 mg | ORAL_TABLET | Freq: Two times a day (BID) | ORAL | Status: DC
  Filled 2014-07-20: qty 1

## 2014-07-20 MED ORDER — POTASSIUM CHLORIDE CRYS ER 20 MEQ PO TBCR
40.0000 meq | EXTENDED_RELEASE_TABLET | Freq: Two times a day (BID) | ORAL | Status: DC
  Administered 2014-07-21 (×2): 40 meq via ORAL
  Filled 2014-07-20 (×3): qty 2

## 2014-07-20 MED ORDER — HYDROXYZINE HCL 10 MG PO TABS
10.0000 mg | ORAL_TABLET | Freq: Three times a day (TID) | ORAL | Status: DC | PRN
  Administered 2014-07-21 – 2014-07-24 (×2): 10 mg via ORAL
  Filled 2014-07-20 (×3): qty 1

## 2014-07-20 MED ORDER — INSULIN GLARGINE 100 UNIT/ML ~~LOC~~ SOLN
16.0000 [IU] | Freq: Every day | SUBCUTANEOUS | Status: DC
  Administered 2014-07-21: 16 [IU] via SUBCUTANEOUS
  Filled 2014-07-20 (×2): qty 0.16

## 2014-07-20 MED ORDER — ALBUTEROL SULFATE (2.5 MG/3ML) 0.083% IN NEBU
2.5000 mg | INHALATION_SOLUTION | RESPIRATORY_TRACT | Status: DC | PRN
Start: 2014-07-20 — End: 2014-07-21

## 2014-07-20 MED ORDER — SODIUM CHLORIDE 0.9 % IJ SOLN
3.0000 mL | Freq: Two times a day (BID) | INTRAMUSCULAR | Status: DC
Start: 2014-07-21 — End: 2014-07-22
  Administered 2014-07-21: 3 mL via INTRAVENOUS

## 2014-07-20 MED ORDER — PROMETHAZINE HCL 25 MG PO TABS: 25.0000 mg | ORAL_TABLET | Freq: Four times a day (QID) | ORAL | Status: DC | PRN

## 2014-07-20 MED ORDER — ASPIRIN EC 81 MG PO TBEC
81.0000 mg | DELAYED_RELEASE_TABLET | Freq: Every day | ORAL | Status: DC
  Administered 2014-07-21 – 2014-07-24 (×4): 81 mg via ORAL
  Filled 2014-07-20 (×4): qty 1

## 2014-07-20 MED ORDER — SODIUM CHLORIDE 0.9 % IV SOLN: 250.0000 mL | INTRAVENOUS | Status: DC | PRN

## 2014-07-20 MED ORDER — ONDANSETRON HCL 4 MG PO TABS
4.0000 mg | ORAL_TABLET | Freq: Four times a day (QID) | ORAL | Status: DC | PRN
Start: 2014-07-20 — End: 2014-07-24
  Administered 2014-07-21 (×2): 4 mg via ORAL
  Filled 2014-07-20 (×2): qty 1

## 2014-07-20 MED ORDER — OXYCODONE HCL 5 MG PO TABS
10.0000 mg | ORAL_TABLET | Freq: Three times a day (TID) | ORAL | Status: DC | PRN
  Administered 2014-07-21 (×3): 10 mg via ORAL
  Filled 2014-07-20 (×3): qty 2

## 2014-07-20 MED ORDER — SODIUM CHLORIDE 0.9 % IJ SOLN
3.0000 mL | Freq: Two times a day (BID) | INTRAMUSCULAR | Status: DC
  Administered 2014-07-21 (×3): 3 mL via INTRAVENOUS
  Administered 2014-07-22: 10:00:00 via INTRAVENOUS

## 2014-07-20 MED ORDER — ONDANSETRON HCL 4 MG/2ML IJ SOLN: 4.0000 mg | Freq: Four times a day (QID) | INTRAMUSCULAR | Status: DC | PRN

## 2014-07-20 MED ORDER — MORPHINE SULFATE 4 MG/ML IJ SOLN: 4.0000 mg | Freq: Once | INTRAMUSCULAR | Status: DC

## 2014-07-20 MED ORDER — FLUTICASONE PROPIONATE 50 MCG/ACT NA SUSP: 2.0000 | Freq: Every day | NASAL | Status: DC | PRN

## 2014-07-20 MED ORDER — ALBUTEROL SULFATE (2.5 MG/3ML) 0.083% IN NEBU: 3.0000 mL | INHALATION_SOLUTION | Freq: Four times a day (QID) | RESPIRATORY_TRACT | Status: DC | PRN

## 2014-07-20 MED ORDER — ALBUTEROL SULFATE (2.5 MG/3ML) 0.083% IN NEBU
2.5000 mg | INHALATION_SOLUTION | Freq: Four times a day (QID) | RESPIRATORY_TRACT | Status: DC | PRN
  Administered 2014-07-21: 2.5 mg via RESPIRATORY_TRACT

## 2014-07-20 NOTE — ED Notes (Signed)
Patient states sob/dyspnea x few days, worsening today, patient on home oxygen and had multiple breathing treatments at home with no improvements, patient received duoneb and 125 mg Solu-medrol per EMS

## 2014-07-20 NOTE — ED Provider Notes (Signed)
48 year old female history of COPD he started having difficult breathing and cough last night which is gone worse today. [place] restarted following an nebulizer treatment at home. She denies fever or chills. On exam, she is morbidly obese. Lungs have mild wheezing on the right. No rales are heard. Extra [place] Copa is a necessary edema. She has already received one nebulizer treatment by EMS as well as methylprednisolone by EMS. She will be given additional nebulizer treatments. Of note, she has multiple admissions for COPD problems and will need to demonstrate considerable improvement in order to be discharged.  I saw and evaluated the patient, reviewed the resident's note and I agree with the findings and plan.   EKG Interpretation   Date/Time:  Monday July 20 2014 18:03:32 EDT Ventricular Rate:  87 PR Interval:  148 QRS Duration: 92 QT Interval:  400 QTC Calculation: 481 R Axis:   38 Text Interpretation:  Normal sinus rhythm Ventricular premature complex  Borderline T abnormalities, anterior leads ST elevation, consider inferior  injury Baseline wander in lead(s) II III aVR aVL aVF V1 When compared with  ECG of 06/07/2014, No significant change was found Confirmed by [facility]  MD,  Alianny Toelle ([id number]) on 07/20/2014 6:09:28 PM        Delora Fuel, MD 66/06/00 4599

## 2014-07-20 NOTE — ED Notes (Signed)
Assisted patient with bsc at this time,

## 2014-07-20 NOTE — H&P (Signed)
PCP:   Gwendolyn Grant, MD   Chief Complaint:  sob  HPI: 48 yo female with h/o sarcoidosis dx in 6834, copd, diastolic chf, chronic resp failure on 3 Liters [place] at home, htn comes in with sob for several days but really bad in the last day or so.  Denies any fevers.  She reports she was swollen a lot in her legs and arms for the last couple of weeks, but she doubled the dose of her demadex and most of that swelling has gone away, but her sob has not improved.  Denies any cough.  She has not had a problem with her sarcoidosis in 5 years.  She is not on steroids a lot.  She does smoke and have a h/o emphysema, continues to smoke.  She denies any cp.  She called ems and on arrival to her house she was wheezing a lot, was given iv solumedrol and several nebs.  She is feeling some better but not back to her baseline.    Review of Systems:  Positive and negative as per HPI otherwise all other systems are negative  Past Medical History: Past Medical History  Diagnosis Date  . Hypertension   . Hyperlipidemia   . Chronic headache   . Fibromyalgia     daily narcotics  . Anxiety     hx chronic BZ use, stopped 07/2010  . Anemia   . Pulmonary sarcoidosis     unimpressive CT chest 2011  . Colonic polyp   . GERD (gastroesophageal reflux disease)   . ALLERGIC RHINITIS   . Asthma   . CHF (congestive heart failure)     Diastolic with fluid overload, May, 2012, LVEF 60%  . Morbid obesity   . Depression   . Panic attacks   . Diabetes mellitus   . COPD (chronic obstructive pulmonary disease)     on home O2, moderate airflow obstruction, suspect d/t emphysema  . Obesity   . Elevated LFTs 09/2011  . Ovarian cyst   . Chronic back pain   . Sleep apnea      CPAP  . Fatty liver   . Esophagitis   . Gastritis   . Internal hemorrhoids   . Adenomatous colon polyp 12/03/09    [facility] in [place], [place]   Past Surgical History  Procedure Laterality Date  . Polypectomy  2011  . Lumbar  microdiscectomy  07/06/2011    R L4-5, stern  . Back surgery    . Carpal tunnel release    . Steroid spinal injections    . Hysteroscopy w/d&c N/A 03/25/2013    Procedure: DILATATION AND CURETTAGE /HYSTEROSCOPY;  Surgeon: [name], MD;  Location: [facility];  Service: Gynecology;  Laterality: N/A;  . Dilation and curettage of uterus      Medications: Prior to Admission medications   Medication Sig Start Date End Date Taking? Authorizing Provider  albuterol (PROVENTIL HFA;VENTOLIN HFA) 108 (90 BASE) MCG/ACT inhaler Inhale 2 puffs into the lungs every 6 (six) hours as needed for wheezing or shortness of breath.   Yes Historical Provider, MD  albuterol (PROVENTIL) (2.5 MG/3ML) 0.083% nebulizer solution Take 2.5 mg by nebulization every 6 (six) hours as needed for wheezing or shortness of breath.   Yes Historical Provider, MD  ALPRAZolam Duanne Moron) 1 MG tablet Take 1 mg by mouth 3 (three) times daily.   Yes Historical Provider, MD  aspirin EC 81 MG tablet Take 81 mg by mouth daily.   Yes Historical Provider,  MD  BIOTIN PO Take 1 capsule by mouth daily.   Yes Historical Provider, MD  budesonide-formoterol (SYMBICORT) 160-4.5 MCG/ACT inhaler Inhale 2 puffs into the lungs 2 (two) times daily.   Yes Historical Provider, MD  budesonide-formoterol (SYMBICORT) 160-4.5 MCG/ACT inhaler Inhale 2 puffs into the lungs 2 (two) times daily.   Yes Historical Provider, MD  cetirizine (ZYRTEC) 1 MG/ML syrup Take 10 mg by mouth at bedtime.   Yes Historical Provider, MD  cetirizine (ZYRTEC) 1 MG/ML syrup Take 10 mg by mouth at bedtime.   Yes Historical Provider, MD  Coenzyme Q10 (CO Q 10) 10 MG CAPS Take 1 capsule by mouth daily.   Yes Historical Provider, MD  esomeprazole (NEXIUM) 40 MG capsule Take 40 mg by mouth 2 (two) times daily before a meal.   Yes Historical Provider, MD  esomeprazole (NEXIUM) 40 MG capsule Take 40 mg by mouth 2 (two) times daily.   Yes Historical Provider, MD  fluticasone (FLONASE) 50  MCG/ACT nasal spray Place 2 sprays into both nostrils daily as needed for allergies or rhinitis.   Yes Historical Provider, MD  gabapentin (NEURONTIN) 300 MG capsule Take 300 mg by mouth at bedtime.   Yes Historical Provider, MD  glimepiride (AMARYL) 1 MG tablet Take 1 mg by mouth daily with breakfast.   Yes Historical Provider, MD  hydrOXYzine (ATARAX/VISTARIL) 10 MG tablet Take 10 mg by mouth 3 (three) times daily as needed for itching.   Yes Historical Provider, MD  insulin glargine (LANTUS) 100 UNIT/ML injection Inject 16 Units into the skin at bedtime.   Yes Historical Provider, MD  Liraglutide (VICTOZA) 18 MG/3ML SOPN Inject 0.6 mg into the skin daily.   Yes Historical Provider, MD  Multiple Vitamin (MULTIVITAMIN) capsule Take 1 capsule by mouth daily.   Yes Historical Provider, MD  omega-3 fish oil (MAXEPA) 1000 MG CAPS capsule Take 1 capsule by mouth daily.   Yes Historical Provider, MD  Oxycodone HCl 10 MG TABS Take 10 mg by mouth 3 (three) times daily as needed (severe pain).   Yes Historical Provider, MD  potassium chloride SA (K-DUR,KLOR-CON) 20 MEQ tablet Take 40 mEq by mouth 2 (two) times daily.   Yes Historical Provider, MD  promethazine (PHENERGAN) 25 MG tablet Take 25 mg by mouth every 6 (six) hours as needed for nausea or vomiting.   Yes Historical Provider, MD  sertraline (ZOLOFT) 100 MG tablet Take 100 mg by mouth daily.    Yes Historical Provider, MD  spironolactone (ALDACTONE) 25 MG tablet Take 25 mg by mouth 2 (two) times daily.   Yes Historical Provider, MD  tiotropium (SPIRIVA) 18 MCG inhalation capsule Place 18 mcg into inhaler and inhale 2 (two) times daily.    Yes Historical Provider, MD  tiZANidine (ZANAFLEX) 4 MG tablet Take 4 mg by mouth every 8 (eight) hours as needed for muscle spasms.   Yes Historical Provider, MD  torsemide (DEMADEX) 100 MG tablet Take 100 mg by mouth 2 (two) times daily.   Yes Historical Provider, MD  varenicline (CHANTIX) 1 MG tablet Take 1 mg by  mouth 2 (two) times daily.   Yes Historical Provider, MD    Allergies:   Allergies  Allergen Reactions  . Simvastatin Other (See Comments)    Severe leg pain and burning  . Sulfamethoxazole-Trimethoprim Nausea And Vomiting    Causes Projectile Vomiting  . Zolpidem Tartrate Other (See Comments)    Chest pain  . Azithromycin Other (See Comments)    Resistant  to med  . Doxycycline Other (See Comments)    Resistant to med  . Latex Itching and Rash    Also burning sensations  . Metformin And Related Diarrhea    Severe diarrhea  . Metronidazole Nausea And Vomiting  . Metolazone Other (See Comments)    Cramps,pain  . Ciprofloxacin Nausea Only    Social History:  reports that she quit smoking about 6 months ago. Her smoking use included Cigarettes. She has a 7.25 pack-year smoking history. She has never used smokeless tobacco. She reports that she drinks alcohol. She reports that she uses illicit drugs (Cocaine and Marijuana).  Family History: Family History  Problem Relation Age of Onset  . Hypertension Mother   . Emphysema Father   . Hypertension Father   . Stomach cancer Father   . Allergies Brother   . Hypertension Brother   . Stomach cancer Brother   . Heart disease Mother   . Heart disease Father   . Heart disease Brother     died age 35 sudden death/MI    Physical Exam: Filed Vitals:   07/20/14 1806 07/20/14 1930 07/20/14 2115 07/20/14 2207  BP: 124/54 117/43    Pulse: 98 85 82 86  Temp: 99.4 F (37.4 C)     TempSrc: Oral     Resp: 24 21 25 17   Height: 5\' 8"  (1.727 m)     Weight: 133.811 kg (295 lb)     SpO2: 97% 95% 95% 93%   General appearance: alert, cooperative and mild distress obese Head: Normocephalic, without obvious abnormality, atraumatic Eyes: negative Nose: Nares normal. Septum midline. Mucosa normal. No drainage or sinus tenderness. Neck: no JVD and supple, symmetrical, trachea midline Lungs: diminished breath sounds bilaterally Heart:  regular rate and rhythm, S1, S2 normal, no murmur, click, rub or gallop Abdomen: soft, non-tender; bowel sounds normal; no masses,  no organomegaly Extremities: extremities normal, atraumatic, no cyanosis or edema Pulses: 2+ and symmetric Skin: Skin color, texture, turgor normal. No rashes or lesions Neurologic: Grossly normal    Labs on Admission:   Recent Labs  07/20/14 1816  NA 136*  K 4.0  CL 96  CO2 24  GLUCOSE 152*  BUN 13  CREATININE 1.11*  CALCIUM 9.0    Recent Labs  07/20/14 1816  AST 20  ALT 25  ALKPHOS 121*  BILITOT 0.8  PROT 8.6*  ALBUMIN 3.8    Recent Labs  07/20/14 1816  WBC 8.5  NEUTROABS 7.7  HGB 13.6  HCT 42.0  MCV 73.0*  PLT 188    Recent Labs  07/20/14 1816  TROPONINI <0.30   Radiological Exams on Admission: Dg Chest Portable 1 View  07/20/2014   CLINICAL DATA:  Shortness of breath.  Initial encounter  EXAM: PORTABLE CHEST - 1 VIEW  COMPARISON:  06/07/2014  FINDINGS: Borderline cardiomegaly, stable from prior. Negative aortic contours.  Chronic lung disease with diffuse interstitial opacities, reference chest CT 01/19/2014 and 08/04/2013. Interstitial opacification has increased diffusely. No effusion or pneumothorax. No focal pneumonia.  IMPRESSION: Diffuse interstitial opacity which could reflect edema or atypical infection. These changes are superimposed on the patient's chronic lung disease.   Electronically Signed   By: Jorje Guild M.D.   On: 07/20/2014 19:38    Assessment/Plan  48 yo female with acute on chronic respiratory hypoxic failure likely multifactorial  Principal Problem:   COPD with acute exacerbation-  Her resp distress is unclear as to what is the primary etiology, but seems more  copd related.  Will provide frequent nebs and iv solumedrol, levaquin and see how she clinically respond to that.  Unclear how her sarcoidosis is involved, would consult pulmonology in the am.  Cont her oxygen at 3 liters at this time, she  has good oxygen sats on her chronic o2 requirements.  cxr unclear if infectious pt is afebrile and wbc is nml.  ???component of edema (pt how doubled her demadex in the last several days, but her chf may also playing a role).  Will start with steroids, nebs, abx only at this time, cont her home meds o/w.    Active Problems:  Stable unless o /w noted   PULMONARY SARCOIDOSIS   Essential hypertension   Chronic respiratory failure   Obesity hypoventilation syndrome   Acute-on-chronic respiratory failure   Chronic diastolic congestive heart failure   Non-compliant behavior   Chest pain  obs on tele.  Full code.    Kelsa Jaworowski A 07/20/2014, 10:26 PM

## 2014-07-20 NOTE — ED Provider Notes (Signed)
CSN: [id number]     Arrival date & time 07/20/14  1800 History   First MD Initiated Contact with Patient 07/20/14 1807     Chief Complaint  Patient presents with  . Respiratory Distress    HPI Comments: 48 y.o. Female with a past medical history of sarcoidosis, asthma, CHF. Presents to the ED due to 2 days of gradual worsening shortness of breath. Associated chest pain. She has an overall feeling very fatigued these last few days. No fever/chills. No vomiting or diarrhea.  Patient is a 48 y.o. female presenting with shortness of breath. The history is provided by the patient.  Shortness of Breath Severity:  Severe Onset quality:  Gradual Duration:  2 days Timing:  Constant Progression:  Worsening Chronicity:  Recurrent Context: not URI   Relieved by:  Oxygen Exacerbated by: laying flat, exertion. Ineffective treatments:  Oxygen Associated symptoms: chest pain   Associated symptoms: no abdominal pain, no cough, no fever, no rash, no sputum production and no vomiting   Risk factors: obesity     Past Medical History  Diagnosis Date  . Hypertension   . Hyperlipidemia   . Chronic headache   . Fibromyalgia     daily narcotics  . Anxiety     hx chronic BZ use, stopped 07/2010  . Anemia   . Pulmonary sarcoidosis     unimpressive CT chest 2011  . Colonic polyp   . GERD (gastroesophageal reflux disease)   . ALLERGIC RHINITIS   . Asthma   . CHF (congestive heart failure)     Diastolic with fluid overload, May, 2012, LVEF 60%  . Morbid obesity   . Depression   . Panic attacks   . Diabetes mellitus   . COPD (chronic obstructive pulmonary disease)     on home O2, moderate airflow obstruction, suspect d/t emphysema  . Obesity   . Elevated LFTs 09/2011  . Ovarian cyst   . Chronic back pain   . Sleep apnea      CPAP  . Fatty liver   . Esophagitis   . Gastritis   . Internal hemorrhoids   . Adenomatous colon polyp 12/03/09    [facility] in [place], [place]   Past  Surgical History  Procedure Laterality Date  . Polypectomy  2011  . Lumbar microdiscectomy  07/06/2011    R L4-5, stern  . Back surgery    . Carpal tunnel release    . Steroid spinal injections    . Hysteroscopy w/d&c N/A 03/25/2013    Procedure: DILATATION AND CURETTAGE /HYSTEROSCOPY;  Surgeon: [name], MD;  Location: [facility];  Service: Gynecology;  Laterality: N/A;  . Dilation and curettage of uterus     Family History  Problem Relation Age of Onset  . Hypertension Mother   . Emphysema Father   . Hypertension Father   . Stomach cancer Father   . Allergies Brother   . Hypertension Brother   . Stomach cancer Brother   . Heart disease Mother   . Heart disease Father   . Heart disease Brother     died age 71 sudden death/MI   History  Substance Use Topics  . Smoking status: Former Smoker -- 0.25 packs/day for 29 years    Types: Cigarettes    Quit date: 01/18/2014  . Smokeless tobacco: Never Used     Comment: Resumed smoking September 2013 --2-3 CIGS DAILY AND E-CIG  . Alcohol Use: Yes     Comment: occasionally  OB History   Grav Para Term Preterm Abortions TAB SAB Ect Mult Living                 Review of Systems  Constitutional: Positive for fatigue. Negative for fever and chills.  Respiratory: Positive for shortness of breath. Negative for cough and sputum production.   Cardiovascular: Positive for chest pain.  Gastrointestinal: Negative for nausea, vomiting and abdominal pain.  Skin: Negative for rash.  All other systems reviewed and are negative.    Allergies  Simvastatin; Sulfamethoxazole-trimethoprim; Zolpidem tartrate; Azithromycin; Doxycycline; Latex; Metformin and related; Metronidazole; Metolazone; and Ciprofloxacin  Home Medications   Prior to Admission medications   Medication Sig Start Date End Date Taking? Authorizing Provider  albuterol (PROVENTIL HFA;VENTOLIN HFA) 108 (90 BASE) MCG/ACT inhaler Inhale 2 puffs into the lungs every 6 (six)  hours as needed for wheezing or shortness of breath.    Historical Provider, MD  albuterol (PROVENTIL) (2.5 MG/3ML) 0.083% nebulizer solution use 1 vial in nebulizer EVERY 6 HOURS AS NEEDED for wheezing or SHORTNESS OF BREATH 07/06/14   Rowe Clack, MD  ALPRAZolam Duanne Moron) 1 MG tablet Take 1 tablet (1 mg total) by mouth 3 (three) times daily. 07/09/14   Rowe Clack, MD  aspirin EC 81 MG tablet Take 81 mg by mouth daily.    Historical Provider, MD  budesonide-formoterol (SYMBICORT) 160-4.5 MCG/ACT inhaler Inhale 2 puffs into the lungs 2 (two) times daily.    Historical Provider, MD  budesonide-formoterol (SYMBICORT) 160-4.5 MCG/ACT inhaler INHALE 2 PUFFS BY MOUTH TWICE DAILY 06/19/14   Kathee Delton, MD  cetirizine (ZYRTEC) 1 MG/ML syrup Take 10 mg by mouth at bedtime.    Historical Provider, MD  cetirizine HCl (ZYRTEC) 5 MG/5ML SYRP Take 10 mLs (10 mg total) by mouth at bedtime. 06/30/14   Rowe Clack, MD  CHANTIX CONTINUING MONTH PAK 1 MG tablet Take 1 tablet by mouth twice daily 07/07/14   Rowe Clack, MD  esomeprazole (NEXIUM) 40 MG capsule Take 40 mg by mouth 2 (two) times daily before a meal.    Historical Provider, MD  esomeprazole (NEXIUM) 40 MG capsule TAKE ONE CAPSULE BY MOUTH TWICE DAILY 07/10/14   Rowe Clack, MD  fluticasone (FLONASE) 50 MCG/ACT nasal spray Place 2 sprays into both nostrils daily as needed for allergies or rhinitis.    Historical Provider, MD  gabapentin (NEURONTIN) 300 MG capsule Take 1 capsule (300 mg total) by mouth at bedtime as needed (pain). 06/30/14   Rowe Clack, MD  glimepiride (AMARYL) 1 MG tablet Take 1 mg by mouth daily with breakfast.    Historical Provider, MD  glimepiride (AMARYL) 1 MG tablet TAKE 1 TABLET BY MOUTH EVERY DAY BEFORE BREAKFAST 06/15/14   Rowe Clack, MD  haloperidol (HALDOL) 1 MG tablet Take 1.5 mg by mouth at bedtime.    Historical Provider, MD  hydrOXYzine (ATARAX/VISTARIL) 10 MG tablet Take 10  mg by mouth 3 (three) times daily as needed for itching.    Historical Provider, MD  hydrOXYzine (ATARAX/VISTARIL) 10 MG tablet TAKE 1 TABLET BY MOUTH THREE TIMES DAILY AS NEEDED FOR ITCHING 06/19/14   Rowe Clack, MD  Insulin Glargine (LANTUS SOLOSTAR) 100 UNIT/ML Solostar Pen Inject 30 Units into the skin 2 (two) times daily.    Historical Provider, MD  insulin glargine (LANTUS) 100 UNIT/ML injection Inject 16 Units into the skin at bedtime.    Historical Provider, MD  Liraglutide (VICTOZA) 18 MG/3ML SOPN  Inject 0.6 mg into the skin daily.    Historical Provider, MD  nicotine (NICODERM CQ - DOSED IN MG/24 HOURS) 14 mg/24hr patch Place 14 mg onto the skin daily.    Historical Provider, MD  Oxycodone HCl 10 MG TABS Take 1 tablet (10 mg total) by mouth 3 (three) times daily as needed (severe pain). 07/02/14   Rowe Clack, MD  oxyCODONE-acetaminophen (PERCOCET/ROXICET) 5-325 MG per tablet Take 2 tablets by mouth every 6 (six) hours as needed for moderate pain or severe pain. 07/01/14   Rowe Clack, MD  potassium chloride SA (K-DUR,KLOR-CON) 20 MEQ tablet Take 40 mEq by mouth 2 (two) times daily.    Historical Provider, MD  predniSONE (DELTASONE) 20 MG tablet Take 40 mg by mouth daily with breakfast.    Historical Provider, MD  promethazine (PHENERGAN) 25 MG tablet Take 25 mg by mouth every 6 (six) hours as needed for nausea or vomiting.    Historical Provider, MD  sertraline (ZOLOFT) 100 MG tablet Take 150 mg by mouth daily.    Historical Provider, MD  spironolactone (ALDACTONE) 25 MG tablet Take 1 tablet by mouth twice daily 07/07/14   Rowe Clack, MD  tiotropium (SPIRIVA) 18 MCG inhalation capsule Place 18 mcg into inhaler and inhale 2 (two) times daily.     Historical Provider, MD  tiZANidine (ZANAFLEX) 4 MG tablet Take 4 mg by mouth every 8 (eight) hours as needed for muscle spasms.    Historical Provider, MD  torsemide (DEMADEX) 100 MG tablet Take 1 tablet (100 mg total) by  mouth 2 (two) times daily. 06/30/14   Rowe Clack, MD  VICTOZA 18 MG/3ML SOPN INJECT 0.6 MG INTO THE SKIN EVERY DAY 07/01/14   Rowe Clack, MD   BP 124/54  Pulse 98  Temp(Src) 99.4 F (37.4 C) (Oral)  Resp 24  Ht 5\' 8"  (1.727 m)  Wt 295 lb (133.811 kg)  BMI 44.86 kg/m2  SpO2 97%  LMP 10/16/2012  Physical Exam  Constitutional: She is oriented to person, place, and time. She appears well-developed and well-nourished. She is cooperative. She appears distressed (tachypneic).  HENT:  Head: Normocephalic and atraumatic.  Right Ear: External ear normal.  Left Ear: External ear normal.  Mouth/Throat: Uvula is midline.  Neck: Normal range of motion and phonation normal.  Cardiovascular: Normal rate and regular rhythm.   Pulses:      Radial pulses are 2+ on the right side, and 2+ on the left side.  Pulmonary/Chest: Breath sounds normal. Tachypnea noted. She has no decreased breath sounds. She has no wheezes. She has no rales.  Speaking in complete sentences  Abdominal: Soft. She exhibits no distension. There is no tenderness. There is no rebound and no guarding.  Neurological: She is alert and oriented to person, place, and time. GCS eye subscore is 4. GCS verbal subscore is 5. GCS motor subscore is 6.  Skin: Skin is warm and dry. No rash noted. She is not diaphoretic.  Psychiatric: She has a normal mood and affect. Her speech is normal.    ED Course  Procedures (including critical care time) Labs Review  Results for orders placed during the hospital encounter of 07/20/14  CBC WITH DIFFERENTIAL      Result Value Ref Range   WBC 8.5  4.0 - 10.5 K/uL   RBC 5.75 (*) 3.87 - 5.11 MIL/uL   Hemoglobin 13.6  12.0 - 15.0 g/dL   HCT 42.0  36.0 - 46.0 %  MCV 73.0 (*) 78.0 - 100.0 fL   MCH 23.7 (*) 26.0 - 34.0 pg   MCHC 32.4  30.0 - 36.0 g/dL   RDW 18.0 (*) 11.5 - 15.5 %   Platelets 188  150 - 400 K/uL   Neutrophils Relative % 90 (*) 43 - 77 %   Neutro Abs 7.7  1.7 - 7.7 K/uL    Lymphocytes Relative 6 (*) 12 - 46 %   Lymphs Abs 0.5 (*) 0.7 - 4.0 K/uL   Monocytes Relative 3  3 - 12 %   Monocytes Absolute 0.3  0.1 - 1.0 K/uL   Eosinophils Relative 1  0 - 5 %   Eosinophils Absolute 0.1  0.0 - 0.7 K/uL   Basophils Relative 0  0 - 1 %   Basophils Absolute 0.0  0.0 - 0.1 K/uL  COMPREHENSIVE METABOLIC PANEL      Result Value Ref Range   Sodium 136 (*) 137 - 147 mEq/L   Potassium 4.0  3.7 - 5.3 mEq/L   Chloride 96  96 - 112 mEq/L   CO2 24  19 - 32 mEq/L   Glucose, Bld 152 (*) 70 - 99 mg/dL   BUN 13  6 - 23 mg/dL   Creatinine, Ser 1.11 (*) 0.50 - 1.10 mg/dL   Calcium 9.0  8.4 - 10.5 mg/dL   Total Protein 8.6 (*) 6.0 - 8.3 g/dL   Albumin 3.8  3.5 - 5.2 g/dL   AST 20  0 - 37 U/L   ALT 25  0 - 35 U/L   Alkaline Phosphatase 121 (*) 39 - 117 U/L   Total Bilirubin 0.8  0.3 - 1.2 mg/dL   GFR calc non Af Amer 58 (*) >90 mL/min   GFR calc Af Amer 67 (*) >90 mL/min   Anion gap 16 (*) 5 - 15  PRO B NATRIURETIC PEPTIDE      Result Value Ref Range   Pro B Natriuretic peptide (BNP) 39.7  0 - 125 pg/mL  TROPONIN I      Result Value Ref Range   Troponin I <0.30  <0.30 ng/mL  TROPONIN I      Result Value Ref Range   Troponin I <0.30  <0.30 ng/mL    Imaging Review Dg Chest Portable 1 View  07/20/2014   CLINICAL DATA:  Shortness of breath.  Initial encounter  EXAM: PORTABLE CHEST - 1 VIEW  COMPARISON:  06/07/2014  FINDINGS: Borderline cardiomegaly, stable from prior. Negative aortic contours.  Chronic lung disease with diffuse interstitial opacities, reference chest CT 01/19/2014 and 08/04/2013. Interstitial opacification has increased diffusely. No effusion or pneumothorax. No focal pneumonia.  IMPRESSION: Diffuse interstitial opacity which could reflect edema or atypical infection. These changes are superimposed on the patient's chronic lung disease.   Electronically Signed   By: Jorje Guild M.D.   On: 07/20/2014 19:38     EKG Interpretation   Date/Time:  Monday  July 20 2014 18:03:32 EDT Ventricular Rate:  87 PR Interval:  148 QRS Duration: 92 QT Interval:  400 QTC Calculation: 481 R Axis:   38 Text Interpretation:  Normal sinus rhythm Ventricular premature complex  Borderline T abnormalities, anterior leads ST elevation, consider inferior  injury Baseline wander in lead(s) II III aVR aVL aVF V1 When compared with  ECG of 06/07/2014, No significant change was found Confirmed by [facility]  MD,  DAVID ([id number]) on 07/20/2014 6:09:28 PM      MDM   Final diagnoses:  Shortness of breath    48 y.o.female with a past medical history of sarcoidosis, asthma, CHF, hypertension, morbid obesity. She presents due to dyspnea. States that it feels like a prior CHF exacerbation. En route with EMS she received a DuoNeb and Solu-Medrol. On initial exam she is to on 4 L via Nasal cannula, maintaining a normal oxygen saturation. Her lungs were essentially clear on initial exam. Speaking in complete sentences.   On reevaluation the patient has developed expiratory wheezing with prolonged expiratory stage. An albuterol nebulizer treatment was ordered for her. She felt somewhat better after this treatment. When she got up to use a bedside commode she had a desaturation to the 80s. Another albuterol nebulizer was ordered for her.  Concern for CHF exacerbation, asthma exacerbation, pneumonia, ACS. Given her gradual onset with a history of previous, no concern for pulmonary embolism at this point.  Labs obtained. CMP notable for a decrease in her GFR, elevation in her creatinine. CBC notable for no leukocytosis, anemia, or thrombocytopenia. Pro-BNP within normal limits. Initial troponin undetectable. Chest x-ray showed diffuse interstitial opacity superimposed on the patient's chronic lung disease.  Based on her clinical picture, labs, imaging I have a low suspicion for pneumonia and CHF exacerbation at this point. Likely an asthma exacerbation. Given her continued chest  heaviness she will need to have serial troponins evaluated.  Hospitalist consulted for admission. She was in agreement that the patient needs to be admitted. The patient's hemodynamics remained acceptable in the ED and she was transferred without incident.  This case was managed in conjunction with my attending, Dr. Roxanne Mins.    Berenice Primas, MD 07/21/14 [id number]

## 2014-07-21 ENCOUNTER — Encounter (HOSPITAL_COMMUNITY): Payer: Self-pay | Admitting: *Deleted

## 2014-07-21 ENCOUNTER — Ambulatory Visit: Payer: Self-pay | Admitting: Internal Medicine

## 2014-07-21 DIAGNOSIS — E669 Obesity, unspecified: Secondary | ICD-10-CM | POA: Diagnosis present

## 2014-07-21 DIAGNOSIS — Z7952 Long term (current) use of systemic steroids: Secondary | ICD-10-CM | POA: Diagnosis not present

## 2014-07-21 DIAGNOSIS — F1410 Cocaine abuse, uncomplicated: Secondary | ICD-10-CM | POA: Diagnosis present

## 2014-07-21 DIAGNOSIS — J45901 Unspecified asthma with (acute) exacerbation: Secondary | ICD-10-CM | POA: Diagnosis present

## 2014-07-21 DIAGNOSIS — G8929 Other chronic pain: Secondary | ICD-10-CM | POA: Diagnosis present

## 2014-07-21 DIAGNOSIS — Z9119 Patient's noncompliance with other medical treatment and regimen: Secondary | ICD-10-CM | POA: Diagnosis present

## 2014-07-21 DIAGNOSIS — I5032 Chronic diastolic (congestive) heart failure: Secondary | ICD-10-CM

## 2014-07-21 DIAGNOSIS — M797 Fibromyalgia: Secondary | ICD-10-CM | POA: Diagnosis present

## 2014-07-21 DIAGNOSIS — J9621 Acute and chronic respiratory failure with hypoxia: Secondary | ICD-10-CM

## 2014-07-21 DIAGNOSIS — Z881 Allergy status to other antibiotic agents status: Secondary | ICD-10-CM | POA: Diagnosis not present

## 2014-07-21 DIAGNOSIS — Z23 Encounter for immunization: Secondary | ICD-10-CM | POA: Diagnosis not present

## 2014-07-21 DIAGNOSIS — E785 Hyperlipidemia, unspecified: Secondary | ICD-10-CM | POA: Diagnosis present

## 2014-07-21 DIAGNOSIS — Z8249 Family history of ischemic heart disease and other diseases of the circulatory system: Secondary | ICD-10-CM | POA: Diagnosis not present

## 2014-07-21 DIAGNOSIS — F1210 Cannabis abuse, uncomplicated: Secondary | ICD-10-CM | POA: Diagnosis present

## 2014-07-21 DIAGNOSIS — J441 Chronic obstructive pulmonary disease with (acute) exacerbation: Secondary | ICD-10-CM | POA: Diagnosis present

## 2014-07-21 DIAGNOSIS — I10 Essential (primary) hypertension: Secondary | ICD-10-CM | POA: Diagnosis present

## 2014-07-21 DIAGNOSIS — F17210 Nicotine dependence, cigarettes, uncomplicated: Secondary | ICD-10-CM | POA: Diagnosis present

## 2014-07-21 DIAGNOSIS — R0602 Shortness of breath: Secondary | ICD-10-CM | POA: Diagnosis present

## 2014-07-21 DIAGNOSIS — Z800 Family history of malignant neoplasm of digestive organs: Secondary | ICD-10-CM | POA: Diagnosis not present

## 2014-07-21 DIAGNOSIS — Z9981 Dependence on supplemental oxygen: Secondary | ICD-10-CM | POA: Diagnosis not present

## 2014-07-21 DIAGNOSIS — Z882 Allergy status to sulfonamides status: Secondary | ICD-10-CM | POA: Diagnosis not present

## 2014-07-21 DIAGNOSIS — Z794 Long term (current) use of insulin: Secondary | ICD-10-CM | POA: Diagnosis not present

## 2014-07-21 DIAGNOSIS — G4733 Obstructive sleep apnea (adult) (pediatric): Secondary | ICD-10-CM | POA: Diagnosis present

## 2014-07-21 DIAGNOSIS — D860 Sarcoidosis of lung: Secondary | ICD-10-CM | POA: Diagnosis present

## 2014-07-21 DIAGNOSIS — M549 Dorsalgia, unspecified: Secondary | ICD-10-CM | POA: Diagnosis present

## 2014-07-21 DIAGNOSIS — D869 Sarcoidosis, unspecified: Secondary | ICD-10-CM

## 2014-07-21 DIAGNOSIS — E119 Type 2 diabetes mellitus without complications: Secondary | ICD-10-CM | POA: Diagnosis present

## 2014-07-21 DIAGNOSIS — Z7982 Long term (current) use of aspirin: Secondary | ICD-10-CM | POA: Diagnosis not present

## 2014-07-21 DIAGNOSIS — Z6841 Body Mass Index (BMI) 40.0 and over, adult: Secondary | ICD-10-CM | POA: Diagnosis not present

## 2014-07-21 DIAGNOSIS — Z825 Family history of asthma and other chronic lower respiratory diseases: Secondary | ICD-10-CM | POA: Diagnosis not present

## 2014-07-21 DIAGNOSIS — Z888 Allergy status to other drugs, medicaments and biological substances status: Secondary | ICD-10-CM | POA: Diagnosis not present

## 2014-07-21 LAB — MRSA PCR SCREENING: MRSA by PCR: NEGATIVE

## 2014-07-21 LAB — CBC
HCT: 42.8 % (ref 36.0–46.0)
HEMOGLOBIN: 13.6 g/dL (ref 12.0–15.0)
MCH: 23.3 pg — ABNORMAL LOW (ref 26.0–34.0)
MCHC: 31.8 g/dL (ref 30.0–36.0)
MCV: 73.4 fL — ABNORMAL LOW (ref 78.0–100.0)
Platelets: 203 10*3/uL (ref 150–400)
RBC: 5.83 MIL/uL — ABNORMAL HIGH (ref 3.87–5.11)
RDW: 18.2 % — AB (ref 11.5–15.5)
WBC: 8.6 10*3/uL (ref 4.0–10.5)

## 2014-07-21 LAB — TROPONIN I: Troponin I: <0.3 ng/mL (ref <0.30)

## 2014-07-21 LAB — BASIC METABOLIC PANEL
ANION GAP: 18 — AB (ref 5–15)
BUN: 19 mg/dL (ref 6–23)
CALCIUM: 9 mg/dL (ref 8.4–10.5)
CHLORIDE: 95 meq/L — AB (ref 96–112)
CO2: 22 meq/L (ref 19–32)
Creatinine, Ser: 1.17 mg/dL — ABNORMAL HIGH (ref 0.50–1.10)
GFR calc Af Amer: 63 mL/min — ABNORMAL LOW (ref >90)
GFR calc non Af Amer: 55 mL/min — ABNORMAL LOW (ref >90)
Glucose, Bld: 281 mg/dL — ABNORMAL HIGH (ref 70–99)
Potassium: 5.1 mEq/L (ref 3.7–5.3)
SODIUM: 135 meq/L — AB (ref 137–147)

## 2014-07-21 LAB — GLUCOSE, CAPILLARY
GLUCOSE-CAPILLARY: 235 mg/dL — AB (ref 70–99)
Glucose-Capillary: 270 mg/dL — ABNORMAL HIGH (ref 70–99)
Glucose-Capillary: 228 mg/dL — ABNORMAL HIGH (ref 70–99)
Glucose-Capillary: 327 mg/dL — ABNORMAL HIGH (ref 70–99)
Glucose-Capillary: 241 mg/dL — ABNORMAL HIGH (ref 70–99)

## 2014-07-21 LAB — PROCALCITONIN

## 2014-07-21 LAB — TSH: TSH: 0.508 u[IU]/mL (ref 0.350–4.500)

## 2014-07-21 MED ORDER — INFLUENZA VAC SPLIT QUAD 0.5 ML IM SUSY
0.5000 mL | PREFILLED_SYRINGE | INTRAMUSCULAR | Status: AC
  Administered 2014-07-21: 0.5 mL via INTRAMUSCULAR
  Filled 2014-07-21: qty 0.5

## 2014-07-21 MED ORDER — NICOTINE 14 MG/24HR TD PT24
14.0000 mg | MEDICATED_PATCH | Freq: Every day | TRANSDERMAL | Status: DC
  Administered 2014-07-21 – 2014-07-24 (×4): 14 mg via TRANSDERMAL
  Filled 2014-07-21 (×5): qty 1

## 2014-07-21 MED ORDER — INSULIN ASPART 100 UNIT/ML ~~LOC~~ SOLN
0.0000 [IU] | Freq: Every day | SUBCUTANEOUS | Status: DC
  Administered 2014-07-21: 2 [IU] via SUBCUTANEOUS

## 2014-07-21 MED ORDER — LEVOFLOXACIN IN D5W 750 MG/150ML IV SOLN
750.0000 mg | INTRAVENOUS | Status: DC
  Administered 2014-07-21 – 2014-07-22 (×2): 750 mg via INTRAVENOUS
  Filled 2014-07-21 (×3): qty 150

## 2014-07-21 MED ORDER — INSULIN GLARGINE 100 UNIT/ML ~~LOC~~ SOLN
18.0000 [IU] | Freq: Every day | SUBCUTANEOUS | Status: DC
  Administered 2014-07-21: 18 [IU] via SUBCUTANEOUS
  Filled 2014-07-21 (×2): qty 0.18

## 2014-07-21 MED ORDER — INSULIN ASPART 100 UNIT/ML ~~LOC~~ SOLN
0.0000 [IU] | Freq: Three times a day (TID) | SUBCUTANEOUS | Status: DC
  Administered 2014-07-21: 11 [IU] via SUBCUTANEOUS
  Administered 2014-07-21: 5 [IU] via SUBCUTANEOUS
  Administered 2014-07-22: 8 [IU] via SUBCUTANEOUS

## 2014-07-21 MED ORDER — IPRATROPIUM-ALBUTEROL 0.5-2.5 (3) MG/3ML IN SOLN
3.0000 mL | Freq: Four times a day (QID) | RESPIRATORY_TRACT | Status: DC
  Administered 2014-07-21 – 2014-07-23 (×9): 3 mL via RESPIRATORY_TRACT
  Filled 2014-07-21 (×10): qty 3

## 2014-07-21 MED ORDER — HEPARIN SODIUM (PORCINE) 5000 UNIT/ML IJ SOLN
5000.0000 [IU] | Freq: Three times a day (TID) | INTRAMUSCULAR | Status: DC
  Administered 2014-07-21 – 2014-07-24 (×9): 5000 [IU] via SUBCUTANEOUS
  Filled 2014-07-21 (×9): qty 1

## 2014-07-21 MED ORDER — NICOTINE 14 MG/24HR TD PT24
14.0000 mg | MEDICATED_PATCH | Freq: Every day | TRANSDERMAL | Status: DC
Start: 2014-07-21 — End: 2014-07-21

## 2014-07-21 MED ORDER — INSULIN ASPART 100 UNIT/ML ~~LOC~~ SOLN: 0.0000 [IU] | Freq: Three times a day (TID) | SUBCUTANEOUS | Status: DC

## 2014-07-21 MED ORDER — HYDROMORPHONE HCL 1 MG/ML IJ SOLN
0.5000 mg | INTRAMUSCULAR | Status: DC | PRN
  Administered 2014-07-21 (×2): 0.5 mg via INTRAVENOUS
  Filled 2014-07-21 (×2): qty 1

## 2014-07-21 MED ORDER — SPIRONOLACTONE 25 MG PO TABS
25.0000 mg | ORAL_TABLET | Freq: Two times a day (BID) | ORAL | Status: DC
  Administered 2014-07-21 – 2014-07-24 (×6): 25 mg via ORAL
  Filled 2014-07-21 (×8): qty 1

## 2014-07-21 MED ORDER — ALBUTEROL SULFATE (2.5 MG/3ML) 0.083% IN NEBU: 2.5000 mg | INHALATION_SOLUTION | RESPIRATORY_TRACT | Status: DC | PRN

## 2014-07-21 MED ORDER — ALBUTEROL SULFATE (2.5 MG/3ML) 0.083% IN NEBU: 2.5000 mg | INHALATION_SOLUTION | Freq: Four times a day (QID) | RESPIRATORY_TRACT | Status: DC

## 2014-07-21 MED ORDER — BENZONATATE 100 MG PO CAPS
100.0000 mg | ORAL_CAPSULE | Freq: Three times a day (TID) | ORAL | Status: DC
  Administered 2014-07-21 – 2014-07-24 (×9): 100 mg via ORAL
  Filled 2014-07-21 (×11): qty 1

## 2014-07-21 MED ORDER — POTASSIUM CHLORIDE CRYS ER 20 MEQ PO TBCR: 40.0000 meq | EXTENDED_RELEASE_TABLET | Freq: Every day | ORAL | Status: DC

## 2014-07-21 MED ORDER — SPIRONOLACTONE 100 MG PO TABS: 100.0000 mg | ORAL_TABLET | Freq: Two times a day (BID) | ORAL | Status: DC

## 2014-07-21 NOTE — Progress Notes (Signed)
Placed patient on CPAP for the night via auto-mode with minimum pressure set at 6cm and maximum pressure set at 20cm. Oxygen set a 4lpm

## 2014-07-21 NOTE — Progress Notes (Signed)
ANTIBIOTIC CONSULT NOTE - INITIAL  Pharmacy Consult:  Levaquin Indication:  CAP  Allergies  Allergen Reactions  . Simvastatin Other (See Comments)    Severe leg pain and burning  . Sulfamethoxazole-Trimethoprim Nausea And Vomiting    Causes Projectile Vomiting  . Zolpidem Tartrate Other (See Comments)    Chest pain  . Azithromycin Other (See Comments)    Resistant to med  . Doxycycline Other (See Comments)    Resistant to med  . Latex Itching and Rash    Also burning sensations  . Metformin And Related Diarrhea    Severe diarrhea  . Metronidazole Nausea And Vomiting  . Metolazone Other (See Comments)    Cramps,pain  . Ciprofloxacin Nausea Only    Patient Measurements: Height: 5\' 9"  (175.3 cm) Weight: 295 lb 1.6 oz (133.856 kg) (scale b) IBW/kg (Calculated) : 66.2  Vital Signs: Temp: 97.6 F (36.4 C) (10/27 0505) Temp Source: Oral (10/27 0505) BP: 108/49 mmHg (10/27 0737) Pulse Rate: 74 (10/27 0737) Intake/Output from previous day: 10/26 0701 - 10/27 0700 In: 480 [P.O.:480] Out: -  Intake/Output from this shift: Total I/O In: 906 [P.O.:900; I.V.:6] Out: 2 [Urine:2]  Labs:  Recent Labs  07/20/14 1816 07/21/14 0527  WBC 8.5 8.6  HGB 13.6 13.6  PLT 188 203  CREATININE 1.11* 1.17*   Estimated Creatinine Clearance: 87.6 ml/min (by C-G formula based on Cr of 1.17). No results found for this basename: VANCOTROUGH, Corlis Leak, VANCORANDOM, [place], GENTPEAK, GENTRANDOM, [place], TOBRAPEAK, TOBRARND, AMIKACINPEAK, AMIKACINTROU, AMIKACIN,  in the last 72 hours   Microbiology: Recent Results (from the past 720 hour(s))  MRSA PCR SCREENING     Status: None   Collection Time    07/21/14 12:25 AM      Result Value Ref Range Status   MRSA by PCR NEGATIVE  NEGATIVE Final   Comment:            The GeneXpert MRSA Assay (FDA     approved for NASAL specimens     only), is one component of a     comprehensive MRSA colonization     surveillance program. It is  not     intended to diagnose MRSA     infection nor to guide or     monitor treatment for     MRSA infections.    Medical History: Past Medical History  Diagnosis Date  . Hypertension   . Hyperlipidemia   . Chronic headache   . Fibromyalgia     daily narcotics  . Anxiety     hx chronic BZ use, stopped 07/2010  . Anemia   . Pulmonary sarcoidosis     unimpressive CT chest 2011  . Colonic polyp   . GERD (gastroesophageal reflux disease)   . ALLERGIC RHINITIS   . Asthma   . CHF (congestive heart failure)     Diastolic with fluid overload, May, 2012, LVEF 60%  . Morbid obesity   . Depression   . Panic attacks   . Diabetes mellitus   . COPD (chronic obstructive pulmonary disease)     on home O2, moderate airflow obstruction, suspect d/t emphysema  . Obesity   . Elevated LFTs 09/2011  . Ovarian cyst   . Chronic back pain   . Sleep apnea      CPAP  . Fatty liver   . Esophagitis   . Gastritis   . Internal hemorrhoids   . Adenomatous colon polyp 12/03/09    [facility] in [place], [place]  Assessment: 5 YOF with history of COPD and OSA to start Levaquin for CAP.  Patient with good renal function, estimated CrCL 65 ml/min.  She is afebrile and WBC is WNL.  Currently on 4L [place].   Goal of Therapy:  Clearance of infection   Plan:  - Levaquin 750mg  IV Q24H - Pharmacy will sign off as dosage adjustment likely unnecessary.  Thank you for the consult!    Kaci Dillie D. Mina Marble, PharmD, BCPS Pager:  [phone] 07/21/2014, 2:33 PM

## 2014-07-21 NOTE — Care Management Note (Signed)
    Page 1 of 1   07/21/2014     11:40:04 AM CARE MANAGEMENT NOTE 07/21/2014  Patient:  [name], [name]   Account Number:  [id number]  Date Initiated:  07/21/2014  Documentation initiated by:  [facility]  Subjective/Objective Assessment:   48 yo female with h/o sarcoidosis dx in 6213, copd, diastolic chf, chronic resp failure on 3 Liters [place] at home, htn comes in with sob for several days.//Home alone.     Action/Plan:   nebs and iv solumedrol, levaquin.//Access for disposotion needs.   Anticipated DC Date:  07/24/2014   Anticipated DC Plan:  [place]  CM consult      [facility] Choice  Resumption Of Svcs/PTA Provider   Choice offered to / List presented to:             Status of service:   Medicare Important Message given?   (If response is "NO", the following Medicare IM given date fields will be blank) Date Medicare IM given:   Medicare IM given by:   Date Additional Medicare IM given:   Additional Medicare IM given by:    Discharge Disposition:    Per UR Regulation:  Reviewed for med. necessity/level of care/duration of stay  If discussed at [place] of Stay Meetings, dates discussed:    Comments:  07/21/14 [place], RN, BSN, [place] [phone] Pt [facility].  NCM reminded pt to have oxygen tank brought from home to hospital prior to discharge for transport.  Pt verbalizes understanding.

## 2014-07-21 NOTE — Consult Note (Signed)
Name: [name] MRN: [id number] DOB: Jan 28, 1966    ADMISSION DATE:  07/20/2014 CONSULTATION DATE:  07/21/2014  REFERRING MD :  Dr. Wynelle Cleveland  CHIEF COMPLAINT:  SOB  BRIEF PATIENT DESCRIPTION: 47 year old female with Hx of COPD, OHS, OSA seen by [facility] in office presented 10/26 to [facility] for eval of SOB. She was admitted to [facility] for presumed COPD exacerbation. 10/27 there has been no significant improvement of her symptoms so PCCM has been asked to see.   SIGNIFICANT EVENTS    STUDIES:    HISTORY OF PRESENT ILLNESS:  48 year old female with PMH as below, which includes COPD, OSA, OHS, on home BiPAP and 3L o2 (seen by [facility] in office), CHF, DM, Obesity. She aslo has a reported history of sarcoidosis, diagnosed in 1997 with biopsy. She was seen in the pulmonary office 7/13 c/o persistent SOB, she did not appear to have acute flare. She also continued to smoke at that time. She made it clear that her BiPAP mask was not sealing appropriately and she was referred to sleep center for formal mask fitting. 10/26 she presented [facility] ED for progressive SOB over several days, much worse last 24-48 hours. She reported recent peripheral edema, which improved when she increased her toresemide dose. She called EMS for transport and upon their arrival she was very SOB and wheezy. She was given nebs and solumedrol en route with significant improvement. 10/27 PCCM asked to consult.   PAST MEDICAL HISTORY :   has a past medical history of Hypertension; Hyperlipidemia; Chronic headache; Fibromyalgia; Anxiety; Anemia; Pulmonary sarcoidosis; Colonic polyp; GERD (gastroesophageal reflux disease); ALLERGIC RHINITIS; Asthma; CHF (congestive heart failure); Morbid obesity; Depression; Panic attacks; Diabetes mellitus; COPD (chronic obstructive pulmonary disease); Obesity; Elevated LFTs (09/2011); Ovarian cyst; Chronic back pain; Sleep apnea; Fatty liver; Esophagitis; Gastritis; Internal hemorrhoids; and Adenomatous colon polyp  (12/03/09).  has past surgical history that includes Polypectomy (2011); Lumbar microdiscectomy (07/06/2011); Back surgery; Carpal tunnel release; steroid spinal injections; Hysteroscopy w/D&C (N/A, 03/25/2013); and Dilation and curettage of uterus. Prior to Admission medications   Medication Sig Start Date End Date Taking? Authorizing Provider  albuterol (PROVENTIL HFA;VENTOLIN HFA) 108 (90 BASE) MCG/ACT inhaler Inhale 2 puffs into the lungs every 6 (six) hours as needed for wheezing or shortness of breath.   Yes Historical Provider, MD  albuterol (PROVENTIL) (2.5 MG/3ML) 0.083% nebulizer solution Take 2.5 mg by nebulization every 6 (six) hours as needed for wheezing or shortness of breath.   Yes Historical Provider, MD  ALPRAZolam Duanne Moron) 1 MG tablet Take 1 mg by mouth 3 (three) times daily.   Yes Historical Provider, MD  aspirin EC 81 MG tablet Take 81 mg by mouth daily.   Yes Historical Provider, MD  BIOTIN PO Take 1 capsule by mouth daily.   Yes Historical Provider, MD  budesonide-formoterol (SYMBICORT) 160-4.5 MCG/ACT inhaler Inhale 2 puffs into the lungs 2 (two) times daily.   Yes Historical Provider, MD  budesonide-formoterol (SYMBICORT) 160-4.5 MCG/ACT inhaler Inhale 2 puffs into the lungs 2 (two) times daily.   Yes Historical Provider, MD  cetirizine (ZYRTEC) 1 MG/ML syrup Take 10 mg by mouth at bedtime.   Yes Historical Provider, MD  cetirizine (ZYRTEC) 1 MG/ML syrup Take 10 mg by mouth at bedtime.   Yes Historical Provider, MD  Coenzyme Q10 (CO Q 10) 10 MG CAPS Take 1 capsule by mouth daily.   Yes Historical Provider, MD  esomeprazole (NEXIUM) 40 MG capsule Take 40 mg by mouth  2 (two) times daily before a meal.   Yes Historical Provider, MD  esomeprazole (NEXIUM) 40 MG capsule Take 40 mg by mouth 2 (two) times daily.   Yes Historical Provider, MD  fluticasone (FLONASE) 50 MCG/ACT nasal spray Place 2 sprays into both nostrils daily as needed for allergies or rhinitis.   Yes Historical  Provider, MD  gabapentin (NEURONTIN) 300 MG capsule Take 300 mg by mouth at bedtime.   Yes Historical Provider, MD  glimepiride (AMARYL) 1 MG tablet Take 1 mg by mouth daily with breakfast.   Yes Historical Provider, MD  hydrOXYzine (ATARAX/VISTARIL) 10 MG tablet Take 10 mg by mouth 3 (three) times daily as needed for itching.   Yes Historical Provider, MD  insulin glargine (LANTUS) 100 UNIT/ML injection Inject 16 Units into the skin at bedtime.   Yes Historical Provider, MD  Liraglutide (VICTOZA) 18 MG/3ML SOPN Inject 0.6 mg into the skin daily.   Yes Historical Provider, MD  Multiple Vitamin (MULTIVITAMIN) capsule Take 1 capsule by mouth daily.   Yes Historical Provider, MD  omega-3 fish oil (MAXEPA) 1000 MG CAPS capsule Take 1 capsule by mouth daily.   Yes Historical Provider, MD  Oxycodone HCl 10 MG TABS Take 10 mg by mouth 3 (three) times daily as needed (severe pain).   Yes Historical Provider, MD  potassium chloride SA (K-DUR,KLOR-CON) 20 MEQ tablet Take 40 mEq by mouth 2 (two) times daily.   Yes Historical Provider, MD  promethazine (PHENERGAN) 25 MG tablet Take 25 mg by mouth every 6 (six) hours as needed for nausea or vomiting.   Yes Historical Provider, MD  sertraline (ZOLOFT) 100 MG tablet Take 100 mg by mouth daily.    Yes Historical Provider, MD  spironolactone (ALDACTONE) 25 MG tablet Take 25 mg by mouth 2 (two) times daily.   Yes Historical Provider, MD  tiotropium (SPIRIVA) 18 MCG inhalation capsule Place 18 mcg into inhaler and inhale 2 (two) times daily.    Yes Historical Provider, MD  tiZANidine (ZANAFLEX) 4 MG tablet Take 4 mg by mouth every 8 (eight) hours as needed for muscle spasms.   Yes Historical Provider, MD  torsemide (DEMADEX) 100 MG tablet Take 100 mg by mouth 2 (two) times daily.   Yes Historical Provider, MD  varenicline (CHANTIX) 1 MG tablet Take 1 mg by mouth 2 (two) times daily.   Yes Historical Provider, MD   Allergies  Allergen Reactions  . Simvastatin Other  (See Comments)    Severe leg pain and burning  . Sulfamethoxazole-Trimethoprim Nausea And Vomiting    Causes Projectile Vomiting  . Zolpidem Tartrate Other (See Comments)    Chest pain  . Azithromycin Other (See Comments)    Resistant to med  . Doxycycline Other (See Comments)    Resistant to med  . Latex Itching and Rash    Also burning sensations  . Metformin And Related Diarrhea    Severe diarrhea  . Metronidazole Nausea And Vomiting  . Metolazone Other (See Comments)    Cramps,pain  . Ciprofloxacin Nausea Only    FAMILY HISTORY:  family history includes Allergies in her brother; Emphysema in her father; Heart disease in her brother, father, and mother; Hypertension in her brother, father, and mother; Stomach cancer in her brother and father. SOCIAL HISTORY:  reports that she has been smoking Cigarettes.  She has a 7.25 pack-year smoking history. She has never used smokeless tobacco. She reports that she drinks alcohol. She reports that she uses illicit drugs (  Cocaine and Marijuana).  REVIEW OF SYSTEMS:   Bolds are positive  Constitutional: weight loss, gain, night sweats, Fevers, chills, fatigue .  HEENT: headaches, Sore throat, sneezing, nasal congestion, post nasal drip, Difficulty swallowing, Tooth/dental problems, visual complaints visual changes, ear ache CV:  chest pain, radiates: ,Orthopnea, PND, swelling in lower extremities, dizziness, palpitations, syncope.  GI  heartburn, indigestion, abdominal pain, nausea, vomiting, diarrhea, change in bowel habits, loss of appetite, bloody stools.  Resp: cough, productive: yellow sputum , hemoptysis, dyspnea, chest pain, pleuritic.  Skin: rash or itching or icterus GU: dysuria, change in color of urine, urgency or frequency. flank pain, hematuria  MS: joint pain or swelling. decreased range of motion  Psych: change in mood or affect. depression or anxiety.  Neuro: difficulty with speech, weakness, numbness, ataxia     SUBJECTIVE:   VITAL SIGNS: Temp:  [97.5 F (36.4 C)-99.4 F (37.4 C)] 97.6 F (36.4 C) (10/27 0505) Pulse Rate:  [74-98] 74 (10/27 0737) Resp:  [17-25] 18 (10/27 0505) BP: (108-148)/(43-64) 108/49 mmHg (10/27 0737) SpO2:  [93 %-100 %] 93 % (10/27 0737) Weight:  [133.811 kg (295 lb)-133.856 kg (295 lb 1.6 oz)] 133.856 kg (295 lb 1.6 oz) (10/26 2351)  PHYSICAL EXAMINATION: General:  Morbidly obese female in NAD Neuro:  Alert, oriented, non-focal HEENT:  [place]/AT, No JVD noted Cardiovascular:  RRR, no MRG Lungs:  Distant lung sounds, diminished. Respirations even, regular, mildly labored.  Abdomen:  Obese, soft, non-tender, non-distended.  Musculoskeletal:  No acute deformity or ROM limitation.  Skin:  Several hyperpigmented areas, mainly at tattoo sites.   Recent Labs Lab 07/20/14 1816 07/21/14 0527  NA 136* 135*  K 4.0 5.1  CL 96 95*  CO2 24 22  BUN 13 19  CREATININE 1.11* 1.17*  GLUCOSE 152* 281*    Recent Labs Lab 07/20/14 1816 07/21/14 0527  HGB 13.6 13.6  HCT 42.0 42.8  WBC 8.5 8.6  PLT 188 203   Dg Chest Portable 1 View  07/20/2014   CLINICAL DATA:  Shortness of breath.  Initial encounter  EXAM: PORTABLE CHEST - 1 VIEW  COMPARISON:  06/07/2014  FINDINGS: Borderline cardiomegaly, stable from prior. Negative aortic contours.  Chronic lung disease with diffuse interstitial opacities, reference chest CT 01/19/2014 and 08/04/2013. Interstitial opacification has increased diffusely. No effusion or pneumothorax. No focal pneumonia.  IMPRESSION: Diffuse interstitial opacity which could reflect edema or atypical infection. These changes are superimposed on the patient's chronic lung disease.   Electronically Signed   By: Jorje Guild M.D.   On: 07/20/2014 19:38    ASSESSMENT / PLAN:  COPD with acute exacerbation Decompensated OHS/OSA ? Aspiration event vs CAP ? Pulmonary edema, pro BNP WNL Tobacco abuse disorder  - Supplemental O2, continue preadmission at  3L via [place]  - Nocturnal BiPAP as per home regimen  - Scheduled nebulized BDs for now  - Start levofloxacin for 5 day course for exacerbation (10/27 > 10/31)  - Continue IV steroids, taper to prednisone when able  - Consider one time dose of lasix.  - Follow CXR  - Trend PCT   - Smoking cessation counseling  - Will need pulmonary follow up within 5 days of discharge  Georgann Housekeeper, ACNP [place] [facility] Pager [phone] or [phone]  Patient seen and examined, agree with above note.  I dictated the care and orders written for this patient under my direction.  Rush Farmer, MD [phone]

## 2014-07-21 NOTE — Progress Notes (Signed)
UR completed 

## 2014-07-21 NOTE — Progress Notes (Signed)
Inpatient Diabetes Program Recommendations  AACE/ADA: New Consensus Statement on Inpatient Glycemic Control (2013)  Target Ranges:  Prepandial:   less than 140 mg/dL      Peak postprandial:   less than 180 mg/dL (1-2 hours)      Critically ill patients:  140 - 180 mg/dL   Note:  Patient receiving SoluMedrol.  Please consider starting Novolog 6 units tid with meals as meal coverage to be given if patient eats at least 50% and CBG is greater than 80 mg/dl.  Thank you.  Shae Hinnenkamp S. Marcelline Mates, RN, CNS, CDE Inpatient Diabetes Program, team pager [phone]

## 2014-07-21 NOTE — Progress Notes (Addendum)
[facility] Progress Note   [name] PPJ:[id number] DOB: 01-28-1966 DOA: 07/20/2014 PCP: Gwendolyn Grant, MD  Brief narrative: [name] is a 48 y.o. female with past medical history of COPD, sarcoidosis, obesity hypoventilation syndrome, diastolic heart failure and morbid obesity chronically on 3-4 L of oxygen at home who presents with increased shortness of breath and cough. She follows with Dr. [name] as outpatient   Subjective: Continues to have a cough which is productive of a small amount of gray sputum. Very short of breath with minimal exertion. Having mid upper back pain which is exacerbated by her cough.  Assessment/Plan: Principal Problem:   Acute-on-chronic respiratory failure/  PULMONARY SARCOIDOSIS/Obesity hypoventilation syndrome/  COPD with acute exacerbation -We'll continue IV steroids and nebs -She does not appear to be fluid overloaded-pro BNP is normal -She is on 4 L of oxygen and tolerating this well -Hold off on antibiotics -Obtain sputum culture -Consult pulmonary-spoke with Dr. Diona Fanti she will need a chest CT to further evaluate infiltrates and assess for lymphadenopathy  Active Problems:    Chronic diastolic congestive heart failure -Doubt acute exacerbation- pro BNP normal -Continue current dose of diuretics - hold potassium for today as K+ high normal  Back pain/chronic pain -Suspect her mid back pain is secondary to cough-will add very low-dose Dilaudid as she is requesting something for breakthrough pain-continue home dose of oxycodone  Morbid obesity/obesity hypoventilation syndrome/obstructive sleep apnea -Continue CPap and O2  Diabetes mellitus insulin requiring -We'll increase dose of Lantus as sugars are elevated due to steroids -Continue current sliding scale insulin  Nicotine abuse -Continue nicotine patch- advised to stop smoking  Code Status: Full code Family Communication: None Disposition Plan: Home DVT  prophylaxis: Heparin  Consultants: Consulting pulmonary  Procedures: None  Antibiotics: Anti-infectives   None         Objective: Filed Weights   07/20/14 1806 07/20/14 2351  Weight: 133.811 kg (295 lb) 133.856 kg (295 lb 1.6 oz)    Intake/Output Summary (Last 24 hours) at 07/21/14 1308 Last data filed at 07/21/14 1148  Gross per 24 hour  Intake    906 ml  Output      0 ml  Net    906 ml     Vitals Filed Vitals:   07/20/14 2351 07/21/14 0244 07/21/14 0505 07/21/14 0737  BP: 124/61  114/49 108/49  Pulse: 84 88 80 74  Temp: 97.5 F (36.4 C)  97.6 F (36.4 C)   TempSrc: Oral  Oral   Resp: 24 20 18    Height: 5\' 9"  (1.753 m)     Weight: 133.856 kg (295 lb 1.6 oz)     SpO2: 95%  100% 93%    Exam: General: No acute respiratory distress-morbidly obese Lungs: Poor air entry throughout all lung fields mild wheeze in the right lung base no rhonchi or crackles. Cardiovascular: Regular rate and rhythm without murmur gallop or rub normal S1 and S2 Abdomen: Nontender, nondistended, soft, bowel sounds positive, no rebound, no ascites, no appreciable mass Extremities: No significant cyanosis, clubbing, or edema bilateral lower extremities  Data Reviewed: Basic Metabolic Panel:  Recent Labs Lab 07/20/14 1816 07/21/14 0527  NA 136* 135*  K 4.0 5.1  CL 96 95*  CO2 24 22  GLUCOSE 152* 281*  BUN 13 19  CREATININE 1.11* 1.17*  CALCIUM 9.0 9.0   Liver Function Tests:  Recent Labs Lab 07/20/14 1816  AST 20  ALT 25  ALKPHOS 121*  BILITOT 0.8  PROT  8.6*  ALBUMIN 3.8   No results found for this basename: LIPASE, AMYLASE,  in the last 168 hours No results found for this basename: AMMONIA,  in the last 168 hours CBC:  Recent Labs Lab 07/20/14 1816 07/21/14 0527  WBC 8.5 8.6  NEUTROABS 7.7  --   HGB 13.6 13.6  HCT 42.0 42.8  MCV 73.0* 73.4*  PLT 188 203   Cardiac Enzymes:  Recent Labs Lab 07/20/14 1816 07/20/14 2226 07/21/14 0527 07/21/14 1135   TROPONINI <0.30 <0.30 <0.30 <0.30   BNP (last 3 results)  Recent Labs  02/11/14 0021 04/17/14 0120 07/20/14 1817  PROBNP 32.8 44.7 39.7   CBG:  Recent Labs Lab 07/21/14 0026 07/21/14 0652 07/21/14 1107  GLUCAP 235* 270* 228*    Recent Results (from the past 240 hour(s))  MRSA PCR SCREENING     Status: None   Collection Time    07/21/14 12:25 AM      Result Value Ref Range Status   MRSA by PCR NEGATIVE  NEGATIVE Final   Comment:            The GeneXpert MRSA Assay (FDA     approved for NASAL specimens     only), is one component of a     comprehensive MRSA colonization     surveillance program. It is not     intended to diagnose MRSA     infection nor to guide or     monitor treatment for     MRSA infections.     Studies:  Recent x-ray studies have been reviewed in detail by the Attending Physician  Scheduled Meds:  Scheduled Meds: . albuterol  2.5 mg Nebulization Q6H  . ALPRAZolam  1 mg Oral TID  . aspirin EC  81 mg Oral Daily  . benzonatate  100 mg Oral TID  . budesonide-formoterol  2 puff Inhalation BID  . gabapentin  300 mg Oral QHS  . glimepiride  1 mg Oral Q breakfast  . insulin aspart  0-15 Units Subcutaneous TID WC  . insulin aspart  0-5 Units Subcutaneous QHS  . insulin glargine  18 Units Subcutaneous QHS  . methylPREDNISolone (SOLU-MEDROL) injection  80 mg Intravenous Q12H  . nicotine  14 mg Transdermal Q0600  . potassium chloride SA  40 mEq Oral BID  . sertraline  100 mg Oral Daily  . sodium chloride  3 mL Intravenous Q12H  . sodium chloride  3 mL Intravenous Q12H  . spironolactone  25 mg Oral BID  . tiotropium  18 mcg Inhalation BID  . torsemide  100 mg Oral BID   Continuous Infusions:   Time spent on care of this patient: 53 minutes   [place], MD 07/21/2014, 1:08 PM  LOS: 1 day   [facility] Office  [phone] Pager - Text Page per [email or web address]  If 7PM-7AM, please contact  night-coverage [email or web address]

## 2014-07-22 ENCOUNTER — Inpatient Hospital Stay (HOSPITAL_COMMUNITY): Payer: PRIVATE HEALTH INSURANCE

## 2014-07-22 DIAGNOSIS — E1165 Type 2 diabetes mellitus with hyperglycemia: Secondary | ICD-10-CM

## 2014-07-22 DIAGNOSIS — Z720 Tobacco use: Secondary | ICD-10-CM

## 2014-07-22 LAB — GLUCOSE, CAPILLARY
GLUCOSE-CAPILLARY: 203 mg/dL — AB (ref 70–99)
GLUCOSE-CAPILLARY: 285 mg/dL — AB (ref 70–99)
Glucose-Capillary: 267 mg/dL — ABNORMAL HIGH (ref 70–99)
Glucose-Capillary: 315 mg/dL — ABNORMAL HIGH (ref 70–99)

## 2014-07-22 LAB — BASIC METABOLIC PANEL
Anion gap: 14 (ref 5–15)
BUN: 22 mg/dL (ref 6–23)
CALCIUM: 9.1 mg/dL (ref 8.4–10.5)
CO2: 28 meq/L (ref 19–32)
CREATININE: 1.11 mg/dL — AB (ref 0.50–1.10)
Chloride: 94 mEq/L — ABNORMAL LOW (ref 96–112)
GFR calc Af Amer: 67 mL/min — ABNORMAL LOW (ref >90)
GFR, EST NON AFRICAN AMERICAN: 58 mL/min — AB (ref >90)
Glucose, Bld: 272 mg/dL — ABNORMAL HIGH (ref 70–99)
Potassium: 4.8 mEq/L (ref 3.7–5.3)
Sodium: 136 mEq/L — ABNORMAL LOW (ref 137–147)

## 2014-07-22 MED ORDER — HYDROMORPHONE HCL 1 MG/ML IJ SOLN
1.0000 mg | Freq: Once | INTRAMUSCULAR | Status: AC
  Administered 2014-07-22: 1 mg via INTRAVENOUS
  Filled 2014-07-22: qty 1

## 2014-07-22 MED ORDER — INSULIN GLARGINE 100 UNIT/ML ~~LOC~~ SOLN
30.0000 [IU] | Freq: Every day | SUBCUTANEOUS | Status: DC
  Administered 2014-07-22: 30 [IU] via SUBCUTANEOUS
  Filled 2014-07-22 (×2): qty 0.3

## 2014-07-22 MED ORDER — INSULIN ASPART 100 UNIT/ML ~~LOC~~ SOLN
0.0000 [IU] | Freq: Three times a day (TID) | SUBCUTANEOUS | Status: DC
  Administered 2014-07-22: 7 [IU] via SUBCUTANEOUS
  Administered 2014-07-22: 15 [IU] via SUBCUTANEOUS
  Administered 2014-07-23: 11 [IU] via SUBCUTANEOUS
  Administered 2014-07-23: 7 [IU] via SUBCUTANEOUS
  Administered 2014-07-24 (×2): 3 [IU] via SUBCUTANEOUS

## 2014-07-22 MED ORDER — OXYCODONE HCL 5 MG PO TABS
10.0000 mg | ORAL_TABLET | Freq: Four times a day (QID) | ORAL | Status: DC | PRN
  Administered 2014-07-22 – 2014-07-24 (×6): 10 mg via ORAL
  Filled 2014-07-22 (×7): qty 2

## 2014-07-22 MED ORDER — BUDESONIDE-FORMOTEROL FUMARATE 160-4.5 MCG/ACT IN AERO
2.0000 | INHALATION_SPRAY | Freq: Two times a day (BID) | RESPIRATORY_TRACT | Status: DC
  Administered 2014-07-22 – 2014-07-24 (×4): 2 via RESPIRATORY_TRACT
  Filled 2014-07-22: qty 6

## 2014-07-22 MED ORDER — INSULIN ASPART 100 UNIT/ML ~~LOC~~ SOLN
6.0000 [IU] | Freq: Three times a day (TID) | SUBCUTANEOUS | Status: DC
  Administered 2014-07-22 – 2014-07-24 (×5): 6 [IU] via SUBCUTANEOUS

## 2014-07-22 MED ORDER — HYDROMORPHONE HCL 1 MG/ML IJ SOLN
0.5000 mg | INTRAMUSCULAR | Status: DC | PRN
  Administered 2014-07-23 – 2014-07-24 (×5): 0.5 mg via INTRAVENOUS
  Filled 2014-07-22 (×5): qty 1

## 2014-07-22 MED ORDER — METHYLPREDNISOLONE SODIUM SUCC 40 MG IJ SOLR
40.0000 mg | Freq: Two times a day (BID) | INTRAMUSCULAR | Status: DC
  Administered 2014-07-22 (×2): 40 mg via INTRAVENOUS
  Filled 2014-07-22 (×4): qty 1

## 2014-07-22 MED ORDER — INSULIN ASPART 100 UNIT/ML ~~LOC~~ SOLN
0.0000 [IU] | Freq: Every day | SUBCUTANEOUS | Status: DC
  Administered 2014-07-22: 2 [IU] via SUBCUTANEOUS

## 2014-07-22 MED ORDER — OXYCODONE HCL 5 MG PO TABS
10.0000 mg | ORAL_TABLET | Freq: Three times a day (TID) | ORAL | Status: DC | PRN
  Filled 2014-07-22: qty 2

## 2014-07-22 MED ORDER — TIOTROPIUM BROMIDE MONOHYDRATE 18 MCG IN CAPS: 18.0000 ug | ORAL_CAPSULE | Freq: Two times a day (BID) | RESPIRATORY_TRACT | Status: DC

## 2014-07-22 NOTE — Progress Notes (Signed)
Name: [name] MRN: [id number] DOB: 1966-06-27    ADMISSION DATE:  07/20/2014 CONSULTATION DATE:  07/21/2014  REFERRING MD :  Dr. Wynelle Cleveland  CHIEF COMPLAINT:  SOB  BRIEF PATIENT DESCRIPTION: 48 year old female with Hx of COPD, OHS, OSA seen by [facility] in office presented 10/26 to [facility] for eval of SOB. She was admitted to [facility] for presumed COPD exacerbation. 10/27 there has been no significant improvement of her symptoms so PCCM has been asked to see.   SIGNIFICANT EVENTS    STUDIES:    INTERVAL:  Subjectively feels better today. Slept well with BiPAP overnight. Describes she has been having difficulty swallowing food.   VITAL SIGNS: Temp:  [97.2 F (36.2 C)-98.2 F (36.8 C)] 97.2 F (36.2 C) (10/28 0515) Pulse Rate:  [72-96] 75 (10/28 1001) Resp:  [18] 18 (10/28 1001) BP: (96-123)/(40-53) 123/50 mmHg (10/28 1001) SpO2:  [92 %-100 %] 98 % (10/28 1001) Weight:  [132.2 kg (291 lb 7.2 oz)] 132.2 kg (291 lb 7.2 oz) (10/28 0515)  PHYSICAL EXAMINATION: General:  Morbidly obese female in NAD Neuro:  Alert, oriented, non-focal HEENT:  [place]/AT, No JVD noted Cardiovascular:  RRR, no MRG Lungs:  Distant lung sounds, diminished. Scant wheeze in bilateral bases  Abdomen:  Obese, soft, non-tender, non-distended.  Musculoskeletal:  No acute deformity or ROM limitation.  Skin:  Several hyperpigmented areas, mainly at tattoo sites.   Recent Labs Lab 07/20/14 1816 07/21/14 0527 07/22/14 0500  NA 136* 135* 136*  K 4.0 5.1 4.8  CL 96 95* 94*  CO2 24 22 28   BUN 13 19 22   CREATININE 1.11* 1.17* 1.11*  GLUCOSE 152* 281* 272*    Recent Labs Lab 07/20/14 1816 07/21/14 0527  HGB 13.6 13.6  HCT 42.0 42.8  WBC 8.5 8.6  PLT 188 203   Dg Chest 2 View  07/22/2014   CLINICAL DATA:  Shortness of breath  EXAM: CHEST  2 VIEW  COMPARISON:  07/20/2014  FINDINGS: Cardiac shadow is stable. Diffuse interstitial changes are again seen but somewhat improved from the prior exam consistent with  improving edema. Some mild residual atelectasis is noted in the right lung base. No sizable effusion is seen.  IMPRESSION: Improved appearance of edema. Some residual right basilar atelectasis is noted.   Electronically Signed   By: [name] M.D.   On: 07/22/2014 12:57   Dg Chest Portable 1 View  07/20/2014   CLINICAL DATA:  Shortness of breath.  Initial encounter  EXAM: PORTABLE CHEST - 1 VIEW  COMPARISON:  06/07/2014  FINDINGS: Borderline cardiomegaly, stable from prior. Negative aortic contours.  Chronic lung disease with diffuse interstitial opacities, reference chest CT 01/19/2014 and 08/04/2013. Interstitial opacification has increased diffusely. No effusion or pneumothorax. No focal pneumonia.  IMPRESSION: Diffuse interstitial opacity which could reflect edema or atypical infection. These changes are superimposed on the patient's chronic lung disease.   Electronically Signed   By: Jorje Guild M.D.   On: 07/20/2014 19:38    ASSESSMENT / PLAN:  COPD with acute exacerbation Decompensated OHS/OSA ? Aspiration event vs CAP ? Pulmonary edema, pro BNP WNL Tobacco abuse disorder  - Supplemental O2, continue preadmission 3L via [place]  - Nocturnal BiPAP as per home regimen  - Scheduled nebulized BDs for now  - Continue levofloxacin for 5 day course for exacerbation (10/27 > 10/31)  - Continue IV steroids, start taper 10/29 if tolerates  - May need swallow eval  - Trend PCT   -  Smoking cessation counseling  - PA/LAT film prior to DC  - Will need pulmonary follow up within 5 days of discharge  Georgann Housekeeper, ACNP [facility] Pager [phone] or [phone]   PCCM ATTENDING: I have interviewed and examined the patient and reviewed the database. I have formulated the assessment and plan as reflected in the note above with amendments made by me.   Appears to be a straight forward AECOPD in a woman who continues to smoke. She is being treated appropriately. I  counseled re: need for smoking cessation  Merton Border, MD;  PCCM service; Mobile [phone]

## 2014-07-22 NOTE — Progress Notes (Signed)
ANTIBIOTIC CONSULT NOTE - INITIAL  Pharmacy Consult for levofloxacin Indication: COPD exacerbation  Allergies  Allergen Reactions  . Simvastatin Other (See Comments)    Severe leg pain and burning  . Sulfamethoxazole-Trimethoprim Nausea And Vomiting    Causes Projectile Vomiting  . Zolpidem Tartrate Other (See Comments)    Chest pain  . Azithromycin Other (See Comments)    Resistant to med  . Doxycycline Other (See Comments)    Resistant to med  . Latex Itching and Rash    Also burning sensations  . Metformin And Related Diarrhea    Severe diarrhea  . Metronidazole Nausea And Vomiting  . Metolazone Other (See Comments)    Cramps,pain  . Ciprofloxacin Nausea Only    Patient Measurements: Height: 5\' 9"  (175.3 cm) Weight: 291 lb 7.2 oz (132.2 kg) IBW/kg (Calculated) : 66.2 Vital Signs: BP: 135/67 mmHg (10/28 1423) Pulse Rate: 81 (10/28 1423) Intake/Output from previous day: 10/27 0701 - 10/28 0700 In: 1726 [P.O.:1720; I.V.:6] Out: 3400 [Urine:3400] Intake/Output from this shift:    Labs:  Recent Labs  07/20/14 1816 07/21/14 0527 07/22/14 0500  WBC 8.5 8.6  --   HGB 13.6 13.6  --   PLT 188 203  --   CREATININE 1.11* 1.17* 1.11*   Estimated Creatinine Clearance: 91.6 ml/min (by C-G formula based on Cr of 1.11). No results found for this basename: VANCOTROUGH, Corlis Leak, VANCORANDOM, [place], GENTPEAK, GENTRANDOM, [place], TOBRAPEAK, TOBRARND, AMIKACINPEAK, AMIKACINTROU, AMIKACIN,  in the last 72 hours   Microbiology: Recent Results (from the past 720 hour(s))  MRSA PCR SCREENING     Status: None   Collection Time    07/21/14 12:25 AM      Result Value Ref Range Status   MRSA by PCR NEGATIVE  NEGATIVE Final   Comment:            The GeneXpert MRSA Assay (FDA     approved for NASAL specimens     only), is one component of a     comprehensive MRSA colonization     surveillance program. It is not     intended to diagnose MRSA     infection nor to  guide or     monitor treatment for     MRSA infections.    Medical History: Past Medical History  Diagnosis Date  . Hypertension   . Hyperlipidemia   . Chronic headache   . Fibromyalgia     daily narcotics  . Anxiety     hx chronic BZ use, stopped 07/2010  . Anemia   . Pulmonary sarcoidosis     unimpressive CT chest 2011  . Colonic polyp   . GERD (gastroesophageal reflux disease)   . ALLERGIC RHINITIS   . Asthma   . CHF (congestive heart failure)     Diastolic with fluid overload, May, 2012, LVEF 60%  . Morbid obesity   . Depression   . Panic attacks   . Diabetes mellitus   . COPD (chronic obstructive pulmonary disease)     on home O2, moderate airflow obstruction, suspect d/t emphysema  . Obesity   . Elevated LFTs 09/2011  . Ovarian cyst   . Chronic back pain   . Sleep apnea      CPAP  . Fatty liver   . Esophagitis   . Gastritis   . Internal hemorrhoids   . Adenomatous colon polyp 12/03/09    [facility] in [place], [place]    Assessment: 69 YOF with hx COPD  and presented to ED on 10/26 with SOB. Pulmonary has seen and continuing levofloxacin for presumed exacerbation- planning a 5 day course.  WBC nml, currently afebrile. SCr 1.1 with est CrCL ~50mL/min.   Goal of Therapy:  proper dosing based on hepatic and renal function  Plan:  1. Continue levofloxacin 750mg  IV q24h as ordered- entered stop date of 10/31 as requested by pulmonary 2. Pharmacy to sign off as no further dose adjustments required. Please re-consult if needed.    Antonia Culbertson D. Elba Schaber, PharmD, BCPS Clinical Pharmacist Pager: [phone] 07/22/2014 8:03 PM

## 2014-07-22 NOTE — Progress Notes (Signed)
Patient c/o pain to right knee.  NP on call made aware.  Awaiting new orders. RN will continue to monitor. Shellee Milo, RN

## 2014-07-22 NOTE — Progress Notes (Signed)
RT Note:  BIPAP (Auto settings) set up at bedside with FFM.  Patient placed mask to face and was tolerating well.  Patient is not ready to be placed on BIPAP at this time.  Patient states she is able to place mask on/off as needed. Patient will need O2 bleed in for BIPAP.  Explained to patient she will need to call for assistance with O2.  Patient is aware of this.

## 2014-07-22 NOTE — Progress Notes (Signed)
[facility] PROGRESS NOTE Interim History: [name] is a 48 y.o. female with past medical history of COPD, sarcoidosis, obesity hypoventilation syndrome, diastolic heart failure and morbid obesity chronically on 3-4 L of oxygen at home who presents with increased shortness of breath and cough. She follows with Dr. [name] as outpatient  Assessment/Plan:  Acute-on-chronic respiratory failure/ PULMONARY SARCOIDOSIS/Obesity hypoventilation syndrome/ COPD with acute exacerbation of COPD: - We'll continue IV steroids and nebs  - She is on 4 L of oxygen and tolerating this well  - Start levaquin - Sputum culture pending. - consult pulmonary rec to repeat CXR. - follow up with pulmonary as an outpatient.  Chronic diastolic congestive heart failure: - Doubt acute exacerbation- pro BNP normal  -Continue current dose of diuretics - hold potassium for today as K+ high normal.   Back pain/chronic pain  - Suspect her mid back pain is secondary to cough. - very low-dose Dilaudid as she is requesting something for breakthrough pain-continue home dose of oxycodone.  Morbid obesity/obesity hypoventilation syndrome/obstructive sleep apnea  - Continue CPap and O2   Diabetes mellitus insulin requiring  -Increase SSI. Cont lantus dose.     Code Status: Full code  Family Communication: None  Disposition Plan: Home  DVT prophylaxis: Heparin    Consultants:  pulmonary  Procedures:  CXR  Antibiotics:  levaquin  HPI/Subjective: Relates SOB is slightly better  Objective: Filed Vitals:   07/22/14 0020 07/22/14 0206 07/22/14 0515 07/22/14 1001  BP:   96/40 123/50  Pulse: 96  72 75  Temp:   97.2 F (36.2 C)   TempSrc:   Axillary   Resp: 18  18 18   Height:      Weight:   132.2 kg (291 lb 7.2 oz)   SpO2: 94% 96% 96% 98%    Intake/Output Summary (Last 24 hours) at 07/22/14 1147 Last data filed at 07/22/14 1107  Gross per 24 hour  Intake   1600 ml  Output   4200  ml  Net  -2600 ml   Filed Weights   07/20/14 1806 07/20/14 2351 07/22/14 0515  Weight: 133.811 kg (295 lb) 133.856 kg (295 lb 1.6 oz) 132.2 kg (291 lb 7.2 oz)    Exam:  General: Alert, awake, oriented x3, in no acute distress.  HEENT: No bruits, no goiter.  Heart: Regular rate and rhythm. Lungs: Good air movement, wheezing Abdomen: Soft, nontender, nondistended, positive bowel sounds.     Data Reviewed: Basic Metabolic Panel:  Recent Labs Lab 07/20/14 1816 07/21/14 0527 07/22/14 0500  NA 136* 135* 136*  K 4.0 5.1 4.8  CL 96 95* 94*  CO2 24 22 28   GLUCOSE 152* 281* 272*  BUN 13 19 22   CREATININE 1.11* 1.17* 1.11*  CALCIUM 9.0 9.0 9.1   Liver Function Tests:  Recent Labs Lab 07/20/14 1816  AST 20  ALT 25  ALKPHOS 121*  BILITOT 0.8  PROT 8.6*  ALBUMIN 3.8   No results found for this basename: LIPASE, AMYLASE,  in the last 168 hours No results found for this basename: AMMONIA,  in the last 168 hours CBC:  Recent Labs Lab 07/20/14 1816 07/21/14 0527  WBC 8.5 8.6  NEUTROABS 7.7  --   HGB 13.6 13.6  HCT 42.0 42.8  MCV 73.0* 73.4*  PLT 188 203   Cardiac Enzymes:  Recent Labs Lab 07/20/14 1816 07/20/14 2226 07/21/14 0527 07/21/14 1135  TROPONINI <0.30 <0.30 <0.30 <0.30   BNP (last 3 results)  Recent  Labs  02/11/14 0021 04/17/14 0120 07/20/14 1817  PROBNP 32.8 44.7 39.7   CBG:  Recent Labs Lab 07/21/14 1107 07/21/14 1619 07/21/14 2126 07/22/14 0629 07/22/14 1104  GLUCAP 228* 327* 241* 267* 315*    Recent Results (from the past 240 hour(s))  MRSA PCR SCREENING     Status: None   Collection Time    07/21/14 12:25 AM      Result Value Ref Range Status   MRSA by PCR NEGATIVE  NEGATIVE Final   Comment:            The GeneXpert MRSA Assay (FDA     approved for NASAL specimens     only), is one component of a     comprehensive MRSA colonization     surveillance program. It is not     intended to diagnose MRSA     infection nor  to guide or     monitor treatment for     MRSA infections.     Studies: Dg Chest Portable 1 View  07/20/2014   CLINICAL DATA:  Shortness of breath.  Initial encounter  EXAM: PORTABLE CHEST - 1 VIEW  COMPARISON:  06/07/2014  FINDINGS: Borderline cardiomegaly, stable from prior. Negative aortic contours.  Chronic lung disease with diffuse interstitial opacities, reference chest CT 01/19/2014 and 08/04/2013. Interstitial opacification has increased diffusely. No effusion or pneumothorax. No focal pneumonia.  IMPRESSION: Diffuse interstitial opacity which could reflect edema or atypical infection. These changes are superimposed on the patient's chronic lung disease.   Electronically Signed   By: Jorje Guild M.D.   On: 07/20/2014 19:38    Scheduled Meds: . ALPRAZolam  1 mg Oral TID  . aspirin EC  81 mg Oral Daily  . benzonatate  100 mg Oral TID  . gabapentin  300 mg Oral QHS  . glimepiride  1 mg Oral Q breakfast  . heparin subcutaneous  5,000 Units Subcutaneous 3 times per day  . insulin aspart  0-15 Units Subcutaneous TID WC  . insulin aspart  0-5 Units Subcutaneous QHS  . insulin glargine  30 Units Subcutaneous QHS  . ipratropium-albuterol  3 mL Nebulization Q6H  . levofloxacin (LEVAQUIN) IV  750 mg Intravenous Q24H  . methylPREDNISolone (SOLU-MEDROL) injection  40 mg Intravenous Q12H  . nicotine  14 mg Transdermal Q0600  . sertraline  100 mg Oral Daily  . sodium chloride  3 mL Intravenous Q12H  . sodium chloride  3 mL Intravenous Q12H  . spironolactone  25 mg Oral BID  . torsemide  100 mg Oral BID   Continuous Infusions:    Charlynne Cousins  [facility] Pager [phone]. If 8PM-8AM, please contact night-coverage at [email or web address], password [facility] 07/22/2014, 11:47 AM  LOS: 2 days

## 2014-07-22 NOTE — Progress Notes (Signed)
Patient evaluated for community based chronic disease management services with [facility] as a benefit of patient's [employer]. Spoke with patient at bedside to explain [facility] services.  Patient feels that she self manages well and that she could benefit from some addition behavioral health support.  Patient will not be readmitted to [facility] services at this time.  Left contact information and [facility] literature at bedside. Made Inpatient Case Manager aware that [facility] following. Of note, [facility] services does not replace or interfere with any services that are arranged by inpatient case management or social work.  For additional questions or referrals please contact Corliss Blacker BSN RN [facility] Liaison at [phone].

## 2014-07-23 ENCOUNTER — Inpatient Hospital Stay (HOSPITAL_COMMUNITY): Payer: PRIVATE HEALTH INSURANCE

## 2014-07-23 ENCOUNTER — Telehealth: Payer: Self-pay | Admitting: *Deleted

## 2014-07-23 ENCOUNTER — Other Ambulatory Visit: Payer: Self-pay

## 2014-07-23 ENCOUNTER — Encounter: Payer: Self-pay | Admitting: Internal Medicine

## 2014-07-23 DIAGNOSIS — I10 Essential (primary) hypertension: Secondary | ICD-10-CM

## 2014-07-23 LAB — PROCALCITONIN: Procalcitonin: <0.1 ng/mL

## 2014-07-23 LAB — GLUCOSE, CAPILLARY
GLUCOSE-CAPILLARY: 232 mg/dL — AB (ref 70–99)
GLUCOSE-CAPILLARY: 108 mg/dL — AB (ref 70–99)
Glucose-Capillary: 254 mg/dL — ABNORMAL HIGH (ref 70–99)
Glucose-Capillary: 138 mg/dL — ABNORMAL HIGH (ref 70–99)

## 2014-07-23 MED ORDER — INSULIN GLARGINE 100 UNIT/ML ~~LOC~~ SOLN
30.0000 [IU] | Freq: Two times a day (BID) | SUBCUTANEOUS | Status: DC
  Administered 2014-07-23 – 2014-07-24 (×3): 30 [IU] via SUBCUTANEOUS
  Filled 2014-07-23 (×5): qty 0.3

## 2014-07-23 MED ORDER — PREDNISONE 50 MG PO TABS
50.0000 mg | ORAL_TABLET | Freq: Every day | ORAL | Status: DC
Start: 2014-07-24 — End: 2014-07-24
  Administered 2014-07-24: 50 mg via ORAL
  Filled 2014-07-23 (×2): qty 1

## 2014-07-23 MED ORDER — LEVOFLOXACIN 750 MG PO TABS
750.0000 mg | ORAL_TABLET | Freq: Every day | ORAL | Status: DC
  Administered 2014-07-23 – 2014-07-24 (×2): 750 mg via ORAL
  Filled 2014-07-23 (×2): qty 1

## 2014-07-23 NOTE — Telephone Encounter (Signed)
[facility] Triage Call Report Triage Record Num: [id number] Operator: Evorn Gong Patient Name: [name] Call Date & Time: 07/18/2014 4:56:49PM Patient Phone: [phone] PCP: [facility] Patient Gender: Female PCP Fax : Patient DOB: [birth year] Practice Name: [facility] Reason for Call: Caller: [name]/Patient; PCP: Gwendolyn Grant (Adults only); CB#: [phone]; Call regarding Swollen hard time breathing dizzy; States that she is SOB and is a little dizzy. Highest that blood sugar has gotten is 185 and pt states that this is good for her. States that her "water pill" isnt working, so she took an extra pill. States that she just feels bad. Pt states that feet are so swollen that she cant get her shoes on. Triaged per Diabetes: Respiratory Problems Guideline. Dispositioned to See ED Immediately per 'Sudden onset of shortness of breath, chest pain and cough with blood tinged sputum" Pt does not have blood tinged sputum, but states that she does have a cough. Advised pt that she needs to be seen in the ED to be evaluated. Pt verbalizes understanding and agrees with plan. Advised to call back with any other questions or concerns. Protocol(s) Used: Diabetes: Respiratory Problems Recommended Outcome per Protocol: See ED Immediately Reason for Outcome: Sudden onset of shortness of breath, chest pain and cough with blood tinged sputum Care Advice: ~ IMMEDIATE ACTION 10/

## 2014-07-23 NOTE — Evaluation (Signed)
Clinical/Bedside Swallow Evaluation Patient Details  Name: [name] MRN: [id number] Date of Birth: 08-01-1966  Today's Date: 07/23/2014 Time: 1029-1049 SLP Time Calculation (min): 20 min  Past Medical History:  Past Medical History  Diagnosis Date  . Hypertension   . Hyperlipidemia   . Chronic headache   . Fibromyalgia     daily narcotics  . Anxiety     hx chronic BZ use, stopped 07/2010  . Anemia   . Pulmonary sarcoidosis     unimpressive CT chest 2011  . Colonic polyp   . GERD (gastroesophageal reflux disease)   . ALLERGIC RHINITIS   . Asthma   . CHF (congestive heart failure)     Diastolic with fluid overload, May, 2012, LVEF 60%  . Morbid obesity   . Depression   . Panic attacks   . Diabetes mellitus   . COPD (chronic obstructive pulmonary disease)     on home O2, moderate airflow obstruction, suspect d/t emphysema  . Obesity   . Elevated LFTs 09/2011  . Ovarian cyst   . Chronic back pain   . Sleep apnea      CPAP  . Fatty liver   . Esophagitis   . Gastritis   . Internal hemorrhoids   . Adenomatous colon polyp 12/03/09    [facility] in [place], [place]   Past Surgical History:  Past Surgical History  Procedure Laterality Date  . Polypectomy  2011  . Lumbar microdiscectomy  07/06/2011    R L4-5, stern  . Back surgery    . Carpal tunnel release    . Steroid spinal injections    . Hysteroscopy w/d&c N/A 03/25/2013    Procedure: DILATATION AND CURETTAGE /HYSTEROSCOPY;  Surgeon: [name], MD;  Location: [facility];  Service: Gynecology;  Laterality: N/A;  . Dilation and curettage of uterus     HPI:  48 yo female with h/o sarcoidosis dx in 7341, copd, diastolic chf, chronic resp failure, htn comes admitted with SOB for several days.  CXR 10/26 Diffuse interstitial opacity which could reflect edema or atypical infection. These changes are superimposed on the patient's chronic lung disease.  Repeat CXR 10/28 Improved appearance of edema. Some residual  right basilar atelectasis is noted.    Assessment / Plan / Recommendation Clinical Impression  Pt. with baseline vocal hoarseness and slight increased work of breathing intermittently post swallows.  Cough x 1 due to phonating immediately after swallowing resulting in premature vocal cord abduction.  SLP educated pt. re: increased aspiration risk due to COPD and shorter apenic period.  She reports frequent globus sensation in chest after eating and pills become stuck in pharynx and expectorating pills "sometimes".  She reported having an EGD 5 years ago (unaware if they dilated esophagus) and stated plans to make appointment with GI (Dr. Olevia Perches).  SLP agrees with GI consult; pt. asking if this can be done while she is an inpatient.  Continue regular diet and thin with reflux precautions (which were reviewed). No follow up needed at this time.        Aspiration Risk   (mild-moderate)    Diet Recommendation Regular;Thin liquid   Liquid Administration via: Cup Medication Administration: Whole meds with liquid Supervision: Patient able to self feed Compensations: Slow rate;Small sips/bites Postural Changes and/or Swallow Maneuvers: Seated upright 90 degrees;Upright 30-60 min after meal    Other  Recommendations Oral Care Recommendations: Oral care BID   Follow Up Recommendations  None    Frequency and Duration  Pertinent Vitals/Pain No pain         Swallow Study          Oral/Motor/Sensory Function Overall Oral Motor/Sensory Function: Appears within functional limits for tasks assessed   Ice Chips Ice chips: Not tested   Thin Liquid Thin Liquid: Impaired Presentation: Cup;Straw Pharyngeal  Phase Impairments: Cough - Immediate    Nectar Thick Nectar Thick Liquid: Not tested   Honey Thick Honey Thick Liquid: Not tested   Puree Puree: Not tested   Solid   GO    Solid: Within functional limits       Mick Sell, Orbie Pyo 07/23/2014,1:07 PM  Orbie Pyo [place] [phone]

## 2014-07-23 NOTE — Progress Notes (Signed)
   Name: [name] MRN: [id number] DOB: 1966/02/21    ADMISSION DATE:  07/20/2014 CONSULTATION DATE:  07/21/2014  REFERRING MD :  Dr. Wynelle Cleveland  CHIEF COMPLAINT:  SOB  BRIEF PATIENT DESCRIPTION: 48 year old female with Hx of COPD, OHS, OSA seen by [facility] in office presented 10/26 to [facility] for eval of SOB. She was admitted to [facility] for presumed COPD exacerbation. 10/27 there has been no significant improvement of her symptoms so PCCM has been asked to see.   SIGNIFICANT EVENTS    STUDIES:    INTERVAL:  Slightly better. NAD   VITAL SIGNS: Temp:  [97.5 F (36.4 C)-97.7 F (36.5 C)] 97.7 F (36.5 C) (10/29 0944) Pulse Rate:  [78-81] 79 (10/29 0944) Resp:  [18] 18 (10/29 0944) BP: (101-135)/(46-67) 101/46 mmHg (10/29 0944) SpO2:  [92 %-100 %] 92 % (10/29 0944) Weight:  [133.72 kg (294 lb 12.8 oz)-133.811 kg (295 lb)] 133.72 kg (294 lb 12.8 oz) (10/29 0431)  PHYSICAL EXAMINATION: General: NAD Neuro:non-focal HEENT: WNL Cardiovascular:  Reg, no  M Lungs: no wheezes Abdomen:  Obese, soft, non-tender, non-distended.  Ext: no edema   I have reviewed all of today's lab results. Relevant abnormalities are discussed in the A/P section  ASSESSMENT / PLAN:  Resolving AECOPD Obesity,  OSA, probable mild OHS Doubt PNA Doubt pulm edema Smoker  - Cont supp O2 - already has at home  - Cont nocturnal BiPAP as per home regimen  - May resume her home bronchodilator regimen  - Taper steroids to off over next 7 days  - Continue levofloxacin for 5 day course for exacerbation (10/27 > 10/31)  - Smoking cessation  - Has appt scheduled with Dr [name] already  PCCM will sign off. Please call if we can be of further assistance  Merton Border, MD;  PCCM service; Mobile [phone]

## 2014-07-23 NOTE — Progress Notes (Addendum)
[facility] PROGRESS NOTE Interim History: [name] is a 48 y.o. female with past medical history of COPD, sarcoidosis, obesity hypoventilation syndrome, diastolic heart failure and morbid obesity chronically on 3-4 L of oxygen at home who presents with increased shortness of breath and cough. She follows with Dr. [name] as outpatient  Assessment/Plan:  Acute-on-chronic respiratory failure/ PULMONARY SARCOIDOSIS/Obesity hypoventilation syndrome/ COPD with acute exacerbation of COPD: - Started on IV steroids, with improvement today. Change steroids to orals, cont oral Levaquin. - Oxygenation improving. - Sputum culture negative - consult pulmonary, agree with manegement. - follow up with pulmonary as an outpatient.  Chronic diastolic congestive heart failure: - Doubt acute exacerbation- pro BNP normal  - Continue current dose of diuretics - hold potassium for today as K+ high normal.   Back pain/chronic pain  - Suspect her mid back pain is secondary to cough. - continue home dose of oxycodone.  Morbid obesity/obesity hypoventilation syndrome/obstructive sleep apnea : - Continue CPap and O2   Diabetes mellitus insulin requiring  -Increase lantus dose. Cont SSI    Code Status: Full code  Family Communication: None  Disposition Plan: Home  DVT prophylaxis: Heparin    Consultants:  pulmonary  Procedures:  CXR  Antibiotics:  levaquin  HPI/Subjective: SOB improved.  Objective: Filed Vitals:   07/23/14 0431 07/23/14 0854 07/23/14 0856 07/23/14 0944  BP:    101/46  Pulse:    79  Temp:    97.7 F (36.5 C)  TempSrc:    Oral  Resp:    18  Height:      Weight: 133.72 kg (294 lb 12.8 oz)     SpO2:  100% 100% 92%    Intake/Output Summary (Last 24 hours) at 07/23/14 1009 Last data filed at 07/23/14 0858  Gross per 24 hour  Intake   2520 ml  Output   6550 ml  Net  -4030 ml   Filed Weights   07/22/14 0515 07/23/14 0430 07/23/14 0431  Weight:  132.2 kg (291 lb 7.2 oz) 133.811 kg (295 lb) 133.72 kg (294 lb 12.8 oz)    Exam:  General: Alert, awake, oriented x3, in no acute distress.  HEENT: No bruits, no goiter.  Heart: Regular rate and rhythm. Lungs: Good air movement, clear Abdomen: Soft, nontender, nondistended, positive bowel sounds.     Data Reviewed: Basic Metabolic Panel:  Recent Labs Lab 07/20/14 1816 07/21/14 0527 07/22/14 0500  NA 136* 135* 136*  K 4.0 5.1 4.8  CL 96 95* 94*  CO2 24 22 28   GLUCOSE 152* 281* 272*  BUN 13 19 22   CREATININE 1.11* 1.17* 1.11*  CALCIUM 9.0 9.0 9.1   Liver Function Tests:  Recent Labs Lab 07/20/14 1816  AST 20  ALT 25  ALKPHOS 121*  BILITOT 0.8  PROT 8.6*  ALBUMIN 3.8   No results found for this basename: LIPASE, AMYLASE,  in the last 168 hours No results found for this basename: AMMONIA,  in the last 168 hours CBC:  Recent Labs Lab 07/20/14 1816 07/21/14 0527  WBC 8.5 8.6  NEUTROABS 7.7  --   HGB 13.6 13.6  HCT 42.0 42.8  MCV 73.0* 73.4*  PLT 188 203   Cardiac Enzymes:  Recent Labs Lab 07/20/14 1816 07/20/14 2226 07/21/14 0527 07/21/14 1135  TROPONINI <0.30 <0.30 <0.30 <0.30   BNP (last 3 results)  Recent Labs  02/11/14 0021 04/17/14 0120 07/20/14 1817  PROBNP 32.8 44.7 39.7   CBG:  Recent Labs Lab  07/22/14 0629 07/22/14 1104 07/22/14 1625 07/22/14 2051 07/23/14 0606  GLUCAP 267* 315* 203* 285* 254*    Recent Results (from the past 240 hour(s))  MRSA PCR SCREENING     Status: None   Collection Time    07/21/14 12:25 AM      Result Value Ref Range Status   MRSA by PCR NEGATIVE  NEGATIVE Final   Comment:            The GeneXpert MRSA Assay (FDA     approved for NASAL specimens     only), is one component of a     comprehensive MRSA colonization     surveillance program. It is not     intended to diagnose MRSA     infection nor to guide or     monitor treatment for     MRSA infections.     Studies: Dg Chest 2  View  07/22/2014   CLINICAL DATA:  Shortness of breath  EXAM: CHEST  2 VIEW  COMPARISON:  07/20/2014  FINDINGS: Cardiac shadow is stable. Diffuse interstitial changes are again seen but somewhat improved from the prior exam consistent with improving edema. Some mild residual atelectasis is noted in the right lung base. No sizable effusion is seen.  IMPRESSION: Improved appearance of edema. Some residual right basilar atelectasis is noted.   Electronically Signed   By: [name] M.D.   On: 07/22/2014 12:57    Scheduled Meds: . ALPRAZolam  1 mg Oral TID  . aspirin EC  81 mg Oral Daily  . benzonatate  100 mg Oral TID  . budesonide-formoterol  2 puff Inhalation BID  . gabapentin  300 mg Oral QHS  . glimepiride  1 mg Oral Q breakfast  . heparin subcutaneous  5,000 Units Subcutaneous 3 times per day  . insulin aspart  0-20 Units Subcutaneous TID WC  . insulin aspart  0-5 Units Subcutaneous QHS  . insulin aspart  6 Units Subcutaneous TID WC  . insulin glargine  30 Units Subcutaneous QHS  . ipratropium-albuterol  3 mL Nebulization Q6H  . levofloxacin  750 mg Oral Daily  . methylPREDNISolone (SOLU-MEDROL) injection  40 mg Intravenous Q12H  . nicotine  14 mg Transdermal Q0600  . sertraline  100 mg Oral Daily  . spironolactone  25 mg Oral BID  . torsemide  100 mg Oral BID   Continuous Infusions:    Charlynne Cousins  [facility] Pager [phone]. If 8PM-8AM, please contact night-coverage at [email or web address], password [facility] 07/23/2014, 10:09 AM  LOS: 3 days

## 2014-07-23 NOTE — Progress Notes (Signed)
SATURATION QUALIFICATIONS: (This note is used to comply with regulatory documentation for home oxygen)  Patient Saturations on Room Air at Rest = 85%  Patient Saturations on Room Air while Ambulating = 82%  Patient Saturations on 4 Liters of oxygen while Ambulating = 92%  Please briefly explain why patient needs home oxygen: Pt needs home oxygen at rest and while ambulating.

## 2014-07-24 ENCOUNTER — Ambulatory Visit: Payer: Medicare Other | Admitting: Internal Medicine

## 2014-07-24 LAB — GLUCOSE, CAPILLARY
GLUCOSE-CAPILLARY: 144 mg/dL — AB (ref 70–99)
Glucose-Capillary: 134 mg/dL — ABNORMAL HIGH (ref 70–99)

## 2014-07-24 MED ORDER — PREDNISONE 10 MG PO TABS: ORAL_TABLET | ORAL | Status: DC

## 2014-07-24 MED ORDER — LEVOFLOXACIN 750 MG PO TABS: 750.0000 mg | ORAL_TABLET | Freq: Every day | ORAL | Status: DC

## 2014-07-24 MED ORDER — IPRATROPIUM-ALBUTEROL 0.5-2.5 (3) MG/3ML IN SOLN
3.0000 mL | Freq: Four times a day (QID) | RESPIRATORY_TRACT | Status: DC | PRN
  Administered 2014-07-24: 3 mL via RESPIRATORY_TRACT
  Filled 2014-07-24: qty 3

## 2014-07-24 NOTE — Progress Notes (Signed)
Patient given discharge instructions and all questions answered.  Patient discharged via wheelchair with all belongings.   

## 2014-07-24 NOTE — Progress Notes (Signed)
RN called and informed RT that pt stated she did not want to go on BiPAP tonight nor her 02am treatment.

## 2014-07-24 NOTE — Discharge Summary (Addendum)
Physician Discharge Summary  Yvette REDER MCN:[id number] DOB: October 16, 1965 DOA: 07/20/2014  PCP: Gwendolyn Grant, MD  Admit date: 07/20/2014 Discharge date: 07/24/2014  Time spent: 35 minutes  Recommendations for Outpatient Follow-up:  1. Follow up with Orthopedic MD next week. 2. Follow up with pulmonary in 2 weeks. 3.    Discharge Diagnoses:  Principal Problem:   Acute-on-chronic respiratory failure Active Problems:   PULMONARY SARCOIDOSIS   Essential hypertension   Chronic respiratory failure   Obesity hypoventilation syndrome   COPD with acute exacerbation   Chronic diastolic congestive heart failure   Non-compliant behavior   Chest pain   Asthma   Discharge Condition: stable  Diet recommendation: heart healthy  Filed Weights   07/23/14 0430 07/23/14 0431 07/24/14 0514  Weight: 133.811 kg (295 lb) 133.72 kg (294 lb 12.8 oz) 133.267 kg (293 lb 12.8 oz)    History of present illness:  48 yo female with h/o sarcoidosis dx in 6294, copd, diastolic chf, chronic resp failure on 3 Liters [place] at home, htn comes in with sob for several days but really bad in the last day or so. Denies any fevers. She reports she was swollen a lot in her legs and arms for the last couple of weeks, but she doubled the dose of her demadex and most of that swelling has gone away, but her sob has not improved. Denies any cough. She has not had a problem with her sarcoidosis in 5 years. She is not on steroids a lot. She does smoke and have a h/o emphysema, continues to smoke. She denies any cp. She called ems and on arrival to her house she was wheezing a lot, was given iv solumedrol and several nebs. She is feeling some better but not back to her baseline   Hospital Course:  Acute-on-chronic respiratory failure/ PULMONARY SARCOIDOSIS/Obesity hypoventilation syndrome/ COPD with acute exacerbation of COPD:  - Started on IV steroids and antibiotics. Change steroids to orals, cont oral Levaquin.  -  Oxygenation improving.  - Sputum culture negative  - consult pulmonary, agree with manegement.  - follow up with pulmonary as an outpatient. Cont steroid tapared and antibiotic orally as an outpatient.  Chronic diastolic congestive heart failure:  - Doubt acute exacerbation- pro BNP normal  - Continue current dose of diuretics - hold potassium for today as K+ high normal.   Back pain/chronic pain  - Suspect her mid back pain is secondary to cough.  - continue home dose of oxycodone.   Morbid obesity/obesity hypoventilation syndrome/obstructive sleep apnea :  - Continue CPap and O2   Diabetes mellitus insulin requiring  -Increase lantus dose. Cont SSI   Procedures:  MRI  Consultations:  PCCM  Discharge Exam: Filed Vitals:   07/24/14 0514  BP: 123/60  Pulse: 82  Temp:   Resp: 20    General: A&O x3 Cardiovascular: RRR Respiratory: good air movement CTA B/L  Discharge Instructions You were cared for by a hospitalist during your hospital stay. If you have any questions about your discharge medications or the care you received while you were in the hospital after you are discharged, you can call the unit and asked to speak with the hospitalist on call if the hospitalist that took care of you is not available. Once you are discharged, your primary care physician will handle any further medical issues. Please note that NO REFILLS for any discharge medications will be authorized once you are discharged, as it is imperative that you return to your  primary care physician (or establish a relationship with a primary care physician if you do not have one) for your aftercare needs so that they can reassess your need for medications and monitor your lab values.  Discharge Instructions   Diet - low sodium heart healthy    Complete by:  As directed      Increase activity slowly    Complete by:  As directed           Current Discharge Medication List    START taking these  medications   Details  levofloxacin (LEVAQUIN) 750 MG tablet Take 1 tablet (750 mg total) by mouth daily. Qty: 4 tablet, Refills: 0    predniSONE (DELTASONE) 10 MG tablet Takes 6 tablets for 1 days, then 5 tablets for 1 days, then 4 tablets for 1 days, then 3 tablets for 1 days, then 2 tabs for 1 days, then 1 tab for 1 days, and then stop. Qty: 21 tablet, Refills: 0      CONTINUE these medications which have NOT CHANGED   Details  albuterol (PROVENTIL HFA;VENTOLIN HFA) 108 (90 BASE) MCG/ACT inhaler Inhale 2 puffs into the lungs every 6 (six) hours as needed for wheezing or shortness of breath.    albuterol (PROVENTIL) (2.5 MG/3ML) 0.083% nebulizer solution Take 2.5 mg by nebulization every 6 (six) hours as needed for wheezing or shortness of breath.    ALPRAZolam (XANAX) 1 MG tablet Take 1 mg by mouth 3 (three) times daily.    aspirin EC 81 MG tablet Take 81 mg by mouth daily.    BIOTIN PO Take 1 capsule by mouth daily.    !! budesonide-formoterol (SYMBICORT) 160-4.5 MCG/ACT inhaler Inhale 2 puffs into the lungs 2 (two) times daily.    !! budesonide-formoterol (SYMBICORT) 160-4.5 MCG/ACT inhaler Inhale 2 puffs into the lungs 2 (two) times daily.    cetirizine (ZYRTEC) 1 MG/ML syrup Take 10 mg by mouth at bedtime.    Coenzyme Q10 (CO Q 10) 10 MG CAPS Take 1 capsule by mouth daily.    esomeprazole (NEXIUM) 40 MG capsule Take 40 mg by mouth 2 (two) times daily before a meal.    fluticasone (FLONASE) 50 MCG/ACT nasal spray Place 2 sprays into both nostrils daily as needed for allergies or rhinitis.    gabapentin (NEURONTIN) 300 MG capsule Take 300 mg by mouth at bedtime.    glimepiride (AMARYL) 1 MG tablet Take 1 mg by mouth daily with breakfast.    hydrOXYzine (ATARAX/VISTARIL) 10 MG tablet Take 10 mg by mouth 3 (three) times daily as needed for itching.    insulin glargine (LANTUS) 100 UNIT/ML injection Inject 16 Units into the skin at bedtime.    Liraglutide (VICTOZA) 18  MG/3ML SOPN Inject 0.6 mg into the skin daily.    Multiple Vitamin (MULTIVITAMIN) capsule Take 1 capsule by mouth daily.    omega-3 fish oil (MAXEPA) 1000 MG CAPS capsule Take 1 capsule by mouth daily.    Oxycodone HCl 10 MG TABS Take 10 mg by mouth 3 (three) times daily as needed (severe pain).    potassium chloride SA (K-DUR,KLOR-CON) 20 MEQ tablet Take 40 mEq by mouth 2 (two) times daily.    promethazine (PHENERGAN) 25 MG tablet Take 25 mg by mouth every 6 (six) hours as needed for nausea or vomiting.    sertraline (ZOLOFT) 100 MG tablet Take 100 mg by mouth daily.     spironolactone (ALDACTONE) 25 MG tablet Take 25 mg by mouth 2 (two)  times daily.    tiotropium (SPIRIVA) 18 MCG inhalation capsule Place 18 mcg into inhaler and inhale 2 (two) times daily.     tiZANidine (ZANAFLEX) 4 MG tablet Take 4 mg by mouth every 8 (eight) hours as needed for muscle spasms.    torsemide (DEMADEX) 100 MG tablet Take 100 mg by mouth 2 (two) times daily.    varenicline (CHANTIX) 1 MG tablet Take 1 mg by mouth 2 (two) times daily.     !! - Potential duplicate medications found. Please discuss with provider.     Allergies  Allergen Reactions  . Simvastatin Other (See Comments)    Severe leg pain and burning  . Sulfamethoxazole-Trimethoprim Nausea And Vomiting    Causes Projectile Vomiting  . Zolpidem Tartrate Other (See Comments)    Chest pain  . Azithromycin Other (See Comments)    Resistant to med  . Doxycycline Other (See Comments)    Resistant to med  . Latex Itching and Rash    Also burning sensations  . Metformin And Related Diarrhea    Severe diarrhea  . Metronidazole Nausea And Vomiting  . Metolazone Other (See Comments)    Cramps,pain  . Ciprofloxacin Nausea Only   Follow-up Information   Follow up with Gwendolyn Grant, MD In 4 weeks. (hospital follow up)    Specialty:  Internal Medicine   Contact information:   [address] [place]  [address] [phone]       Follow up with Johnny Bridge, MD In 1 week. (hospital)    Specialty:  Orthopedic Surgery   Contact information:   [place] [address] [phone]        The results of significant diagnostics from this hospitalization (including imaging, microbiology, ancillary and laboratory) are listed below for reference.    Significant Diagnostic Studies: Dg Chest 2 View  07/22/2014   CLINICAL DATA:  Shortness of breath  EXAM: CHEST  2 VIEW  COMPARISON:  07/20/2014  FINDINGS: Cardiac shadow is stable. Diffuse interstitial changes are again seen but somewhat improved from the prior exam consistent with improving edema. Some mild residual atelectasis is noted in the right lung base. No sizable effusion is seen.  IMPRESSION: Improved appearance of edema. Some residual right basilar atelectasis is noted.   Electronically Signed   By: [name] M.D.   On: 07/22/2014 12:57   Mr Knee Right Wo Contrast  07/24/2014   CLINICAL DATA:  No injury. Hernia for 1 year. Tingling and numbness.  EXAM: MRI OF THE RIGHT KNEE WITHOUT CONTRAST  TECHNIQUE: Multiplanar, multisequence MR imaging of the knee was performed. No intravenous contrast was administered.  COMPARISON:  None.  FINDINGS: Significant patient motion degrading image quality limiting evaluation.  MENISCI  Medial meniscus:  Intact.  Lateral meniscus:  Intact.  LIGAMENTS  Cruciates:  Intact ACL and PCL.  Collaterals: Medial collateral ligament is intact. Lateral collateral ligament complex is intact.  CARTILAGE  Patellofemoral: Full-thickness cartilage loss of the lateral patellar facet with subchondral reactive change and marginal osteophytosis. High-grade partial-thickness cartilage loss of the lateral trochlea with subchondral reactive marrow edema.  Medial:  No focal chondral defect.  Lateral: Partial thickness cartilage loss of the lateral femoral condyle and lateral tibial plateau with  subchondral marrow change in the lateral femoral condyle. Marginal osteophytosis.  Joint: No significant joint effusion. No plical thickening. Normal Hoffa's fat. There is mild increased signal surrounding the ACL concerning for  mild synovitis. There is a 2 x 1 x 2.4 cm intermediate signal focal area posterior to the PCL concerning for focal synovitis versus PVNS.  Popliteal Fossa:  No Baker cyst.  Intact popliteus tendon.  Extensor Mechanism:  Intact.  Bones: Serpiginous lesion in the proximal tibial metaphysis extending into the diaphysis with T2 hyperintensity and T1 hypointensity with scattered areas of T1 hyperintensity. No fracture or dislocation.  IMPRESSION: 1. Cartilage abnormalities involving the lateral femorotibial compartment and patellofemoral compartment as described above. 2. 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 3. There is a 2 x 1 x 2.4 cm intermediate signal focal area posterior to the PCL concerning for focal synovitis versus PVNS.   Electronically Signed   By: Kathreen Devoid   On: 07/24/2014 08:12   Dg Chest Portable 1 View  07/20/2014   CLINICAL DATA:  Shortness of breath.  Initial encounter  EXAM: PORTABLE CHEST - 1 VIEW  COMPARISON:  06/07/2014  FINDINGS: Borderline cardiomegaly, stable from prior. Negative aortic contours.  Chronic lung disease with diffuse interstitial opacities, reference chest CT 01/19/2014 and 08/04/2013. Interstitial opacification has increased diffusely. No effusion or pneumothorax. No focal pneumonia.  IMPRESSION: Diffuse interstitial opacity which could reflect edema or atypical infection. These changes are superimposed on the patient's chronic lung disease.   Electronically Signed   By: Jorje Guild M.D.   On: 07/20/2014 19:38    Microbiology: Recent Results (from the past 240 hour(s))  MRSA PCR SCREENING      Status: None   Collection Time    07/21/14 12:25 AM      Result Value Ref Range Status   MRSA by PCR NEGATIVE  NEGATIVE Final   Comment:            The GeneXpert MRSA Assay (FDA     approved for NASAL specimens     only), is one component of a     comprehensive MRSA colonization     surveillance program. It is not     intended to diagnose MRSA     infection nor to guide or     monitor treatment for     MRSA infections.     Labs: Basic Metabolic Panel:  Recent Labs Lab 07/20/14 1816 07/21/14 0527 07/22/14 0500  NA 136* 135* 136*  K 4.0 5.1 4.8  CL 96 95* 94*  CO2 24 22 28   GLUCOSE 152* 281* 272*  BUN 13 19 22   CREATININE 1.11* 1.17* 1.11*  CALCIUM 9.0 9.0 9.1   Liver Function Tests:  Recent Labs Lab 07/20/14 1816  AST 20  ALT 25  ALKPHOS 121*  BILITOT 0.8  PROT 8.6*  ALBUMIN 3.8   No results found for this basename: LIPASE, AMYLASE,  in the last 168 hours No results found for this basename: AMMONIA,  in the last 168 hours CBC:  Recent Labs Lab 07/20/14 1816 07/21/14 0527  WBC 8.5 8.6  NEUTROABS 7.7  --   HGB 13.6 13.6  HCT 42.0 42.8  MCV 73.0* 73.4*  PLT 188 203   Cardiac Enzymes:  Recent Labs Lab 07/20/14 1816 07/20/14 2226 07/21/14 0527 07/21/14 1135  TROPONINI <0.30 <0.30 <0.30 <0.30   BNP: BNP (last 3 results)  Recent Labs  02/11/14 0021 04/17/14 0120 07/20/14 1817  PROBNP 32.8 44.7 39.7   CBG:  Recent Labs Lab 07/23/14 0606 07/23/14 1018 07/23/14 1622  07/23/14 2138 07/24/14 0557  GLUCAP 254* 232* 108* 138* 144*       Signed:  Charlynne Cousins  [facility] 07/24/2014, 9:57 AM

## 2014-07-29 ENCOUNTER — Other Ambulatory Visit: Payer: Self-pay | Admitting: Internal Medicine

## 2014-07-29 MED ORDER — OXYCODONE HCL 10 MG PO TABS: 10.0000 mg | ORAL_TABLET | Freq: Three times a day (TID) | ORAL | Status: DC | PRN

## 2014-07-29 NOTE — Telephone Encounter (Signed)
Notified pt rx ready for pick-up. Place in cabinet.../lmb 

## 2014-07-29 NOTE — Telephone Encounter (Signed)
Patient is requesting refill on oxycodone HCL 10mg .  She will be in tomorrow and was wanting to pick up then

## 2014-07-30 ENCOUNTER — Encounter: Payer: Self-pay | Admitting: Internal Medicine

## 2014-07-30 ENCOUNTER — Ambulatory Visit (INDEPENDENT_AMBULATORY_CARE_PROVIDER_SITE_OTHER): Payer: PRIVATE HEALTH INSURANCE | Admitting: Internal Medicine

## 2014-07-30 VITALS — BP 112/72 | HR 91 | Temp 98.3°F | Ht 68.0 in | Wt 291.5 lb

## 2014-07-30 DIAGNOSIS — J9611 Chronic respiratory failure with hypoxia: Secondary | ICD-10-CM

## 2014-07-30 DIAGNOSIS — J441 Chronic obstructive pulmonary disease with (acute) exacerbation: Secondary | ICD-10-CM

## 2014-07-30 DIAGNOSIS — E1165 Type 2 diabetes mellitus with hyperglycemia: Secondary | ICD-10-CM

## 2014-07-30 DIAGNOSIS — IMO0002 Reserved for concepts with insufficient information to code with codable children: Secondary | ICD-10-CM

## 2014-07-30 DIAGNOSIS — I10 Essential (primary) hypertension: Secondary | ICD-10-CM

## 2014-07-30 MED ORDER — METHYLPREDNISOLONE ACETATE 80 MG/ML IJ SUSP
80.0000 mg | Freq: Once | INTRAMUSCULAR | Status: AC
  Administered 2014-07-30: 80 mg via INTRAMUSCULAR

## 2014-07-30 MED ORDER — HYDROCODONE-HOMATROPINE 5-1.5 MG/5ML PO SYRP: 5.0000 mL | ORAL_SOLUTION | Freq: Four times a day (QID) | ORAL | Status: DC | PRN

## 2014-07-30 MED ORDER — LEVOFLOXACIN 250 MG PO TABS: 250.0000 mg | ORAL_TABLET | Freq: Every day | ORAL | Status: DC

## 2014-07-30 MED ORDER — PREDNISONE 10 MG PO TABS: ORAL_TABLET | ORAL | Status: DC

## 2014-07-30 MED ORDER — PROMETHAZINE HCL 25 MG PO TABS: 25.0000 mg | ORAL_TABLET | Freq: Four times a day (QID) | ORAL | Status: DC | PRN

## 2014-07-30 MED ORDER — TORSEMIDE 100 MG PO TABS: 100.0000 mg | ORAL_TABLET | Freq: Two times a day (BID) | ORAL | Status: DC

## 2014-07-30 MED ORDER — TIZANIDINE HCL 4 MG PO TABS: 4.0000 mg | ORAL_TABLET | Freq: Three times a day (TID) | ORAL | Status: DC | PRN

## 2014-07-30 MED ORDER — HYDROXYZINE HCL 10 MG PO TABS: 10.0000 mg | ORAL_TABLET | Freq: Three times a day (TID) | ORAL | Status: DC | PRN

## 2014-07-30 NOTE — Progress Notes (Signed)
Subjective:    [name], female    DOB: Oct 17, 1965, 48 y.o.   MRN: [id number]  HPI  Here to f/u with me in Dr Lenn Cal out of office, for f/u recent hospn with acute on chronic resp failure, improved then mild worse again last few days with prod cough, wheezing, sob. Pt denies chest pain, orthopnea, PND, increased LE swelling, palpitations, dizziness or syncope.  Asks for cough med for unremitting cough, but nonprod, no fever or increased pain. Pt denies new neurological symptoms such as new headache, or facial or extremity weakness or numbness   Pt denies polydipsia, polyuria,  Past Medical History  Diagnosis Date  . Hypertension   . Hyperlipidemia   . Chronic headache   . Fibromyalgia     daily narcotics  . Anxiety     hx chronic BZ use, stopped 07/2010  . Anemia   . Pulmonary sarcoidosis     unimpressive CT chest 2011  . Colonic polyp   . GERD (gastroesophageal reflux disease)   . ALLERGIC RHINITIS   . Asthma   . CHF (congestive heart failure)     Diastolic with fluid overload, May, 2012, LVEF 60%  . Morbid obesity   . Depression   . Panic attacks   . Diabetes mellitus   . COPD (chronic obstructive pulmonary disease)     on home O2, moderate airflow obstruction, suspect d/t emphysema  . Obesity   . Elevated LFTs 09/2011  . Ovarian cyst   . Chronic back pain   . Sleep apnea      CPAP  . Fatty liver   . Esophagitis   . Gastritis   . Internal hemorrhoids   . Adenomatous colon polyp 12/03/09    [facility] in [place], [place]   Past Surgical History  Procedure Laterality Date  . Polypectomy  2011  . Lumbar microdiscectomy  07/06/2011    R L4-5, stern  . Back surgery    . Carpal tunnel release    . Steroid spinal injections    . Hysteroscopy w/d&c N/A 03/25/2013    Procedure: DILATATION AND CURETTAGE /HYSTEROSCOPY;  Surgeon: [name], MD;  Location: [facility];  Service: Gynecology;  Laterality: N/A;  . Dilation and curettage of uterus      reports  that she has been smoking Cigarettes.  She has a 7.25 pack-year smoking history. She has never used smokeless tobacco. She reports that she drinks alcohol. She reports that she uses illicit drugs (Cocaine and Marijuana). family history includes Allergies in her brother; Emphysema in her father; Heart disease in her brother, father, and mother; Hypertension in her brother, father, and mother; Stomach cancer in her brother and father. Allergies  Allergen Reactions  . Simvastatin Other (See Comments)    Severe leg pain and burning  . Sulfamethoxazole-Trimethoprim Nausea And Vomiting    Causes Projectile Vomiting  . Zolpidem Tartrate Other (See Comments)    Chest pain  . Azithromycin Other (See Comments)    Resistant to med  . Doxycycline Other (See Comments)    Resistant to med  . Latex Itching and Rash    Also burning sensations  . Metformin And Related Diarrhea    Severe diarrhea  . Metronidazole Nausea And Vomiting  . Metolazone Other (See Comments)    Cramps,pain  . Ciprofloxacin Nausea Only   Current Outpatient Prescriptions on File Prior to Visit  Medication Sig Dispense Refill  . albuterol (PROVENTIL HFA;VENTOLIN HFA) 108 (90 BASE) MCG/ACT  inhaler Inhale 2 puffs into the lungs every 6 (six) hours as needed for wheezing or shortness of breath.    Marland Kitchen albuterol (PROVENTIL) (2.5 MG/3ML) 0.083% nebulizer solution Take 2.5 mg by nebulization every 6 (six) hours as needed for wheezing or shortness of breath.    . ALPRAZolam (XANAX) 1 MG tablet Take 1 mg by mouth 3 (three) times daily.    Marland Kitchen aspirin EC 81 MG tablet Take 81 mg by mouth daily.    Marland Kitchen BIOTIN PO Take 1 capsule by mouth daily.    . budesonide-formoterol (SYMBICORT) 160-4.5 MCG/ACT inhaler Inhale 2 puffs into the lungs 2 (two) times daily.    . budesonide-formoterol (SYMBICORT) 160-4.5 MCG/ACT inhaler Inhale 2 puffs into the lungs 2 (two) times daily.    . cetirizine (ZYRTEC) 1 MG/ML syrup Take 10 mg by mouth at bedtime.    .  Coenzyme Q10 (CO Q 10) 10 MG CAPS Take 1 capsule by mouth daily.    Marland Kitchen esomeprazole (NEXIUM) 40 MG capsule Take 40 mg by mouth 2 (two) times daily before a meal.    . fluticasone (FLONASE) 50 MCG/ACT nasal spray Place 2 sprays into both nostrils daily as needed for allergies or rhinitis.    Marland Kitchen gabapentin (NEURONTIN) 300 MG capsule Take 300 mg by mouth at bedtime.    Marland Kitchen glimepiride (AMARYL) 1 MG tablet Take 1 mg by mouth daily with breakfast.    . insulin glargine (LANTUS) 100 UNIT/ML injection Inject 16 Units into the skin at bedtime.    Marland Kitchen levofloxacin (LEVAQUIN) 750 MG tablet Take 1 tablet (750 mg total) by mouth daily. 4 tablet 0  . Liraglutide (VICTOZA) 18 MG/3ML SOPN Inject 0.6 mg into the skin daily.    . Multiple Vitamin (MULTIVITAMIN) capsule Take 1 capsule by mouth daily.    Marland Kitchen omega-3 fish oil (MAXEPA) 1000 MG CAPS capsule Take 1 capsule by mouth daily.    . Oxycodone HCl 10 MG TABS Take 1 tablet (10 mg total) by mouth 3 (three) times daily as needed (severe pain). 90 tablet 0  . potassium chloride SA (K-DUR,KLOR-CON) 20 MEQ tablet Take 40 mEq by mouth 2 (two) times daily.    . predniSONE (DELTASONE) 10 MG tablet Takes 6 tablets for 1 days, then 5 tablets for 1 days, then 4 tablets for 1 days, then 3 tablets for 1 days, then 2 tabs for 1 days, then 1 tab for 1 days, and then stop. 21 tablet 0  . sertraline (ZOLOFT) 100 MG tablet Take 100 mg by mouth daily.     Marland Kitchen spironolactone (ALDACTONE) 25 MG tablet Take 25 mg by mouth 2 (two) times daily.    Marland Kitchen tiotropium (SPIRIVA) 18 MCG inhalation capsule Place 18 mcg into inhaler and inhale 2 (two) times daily.     . varenicline (CHANTIX) 1 MG tablet Take 1 mg by mouth 2 (two) times daily.     No current facility-administered medications on file prior to visit.   Review of Systems  Constitutional: Negative for unusual diaphoresis or other sweats  HENT: Negative for ringing in ear Eyes: Negative for double vision or worsening visual disturbance.    Respiratory: Negative for choking and stridor.   Gastrointestinal: Negative for vomiting or other signifcant bowel change Genitourinary: Negative for hematuria or decreased urine volume.  Musculoskeletal: Negative for other MSK pain or swelling Skin: Negative for color change and worsening wound.  Neurological: Negative for tremors and numbness other than noted  Psychiatric/Behavioral: Negative for decreased concentration  or agitation other than above       Objective:   Physical Exam BP 112/72 mmHg  Pulse 91  Temp(Src) 98.3 F (36.8 C) (Oral)  Ht 5\' 8"  (1.727 m)  Wt 291 lb 8 oz (132.224 kg)  BMI 44.33 kg/m2  SpO2 94%  LMP 10/16/2012 VS noted,  Constitutional: Pt appears well-developed, well-nourished.  HENT: Head: NCAT.  Right Ear: External ear normal.  Left Ear: External ear normal.  Eyes: . Pupils are equal, round, and reactive to light. Conjunctivae and EOM are normal Neck: Normal range of motion. Neck supple.  Cardiovascular: Normal rate and regular rhythm.   Pulmonary/Chest: Effort normal and breath sounds decr, no rales or wheezing.  Abd:  Soft, NT, ND, + BS Neurological: Pt is alert. Not confused , motor grossly intact Skin: Skin is warm. No rash Psychiatric: Pt behavior is normal. No agitation.     Assessment & Plan:

## 2014-07-30 NOTE — Progress Notes (Signed)
Pre visit review using our clinic review tool, if applicable. No additional management support is needed unless otherwise documented below in the visit note. 

## 2014-07-30 NOTE — Patient Instructions (Addendum)
You had the steroid shot today  Please take all new medication as prescribed  - the antibiotic, and the cough medicine and prednisone  Please continue all other medications as before  Please have the pharmacy call with any other refills you may need.  Please keep your appointments with your specialists as you may have planned - Dr [name]  Please return in 3 months, or sooner if needed, to myself or Dr Asa Lente

## 2014-07-31 ENCOUNTER — Telehealth: Payer: Self-pay | Admitting: Internal Medicine

## 2014-07-31 ENCOUNTER — Other Ambulatory Visit: Payer: Self-pay

## 2014-07-31 NOTE — Telephone Encounter (Signed)
emmi emailed °

## 2014-08-01 NOTE — Assessment & Plan Note (Signed)
stable overall by history and exam, recent data reviewed with pt, and pt to continue medical treatment as before,  to f/u any worsening symptoms or concerns Lab Results  Component Value Date   HGBA1C 8.0* 04/06/2014

## 2014-08-01 NOTE — Assessment & Plan Note (Addendum)
Mild to mod, improved overall then wrose again, for cough med, antibiotic, predpack, cont all other meds,   to f/u any worsening symptoms or concerns

## 2014-08-01 NOTE — Assessment & Plan Note (Signed)
stable overall by history and exam, recent data reviewed with pt, and pt to continue medical treatment as before,  to f/u any worsening symptoms or concerns SpO2 Readings from Last 3 Encounters:  07/30/14 94%  07/24/14 98%  06/07/14 98%

## 2014-08-01 NOTE — Assessment & Plan Note (Signed)
stable overall by history and exam, recent data reviewed with pt, and pt to continue medical treatment as before,  to f/u any worsening symptoms or concerns BP Readings from Last 3 Encounters:  07/30/14 112/72  07/24/14 113/56  06/07/14 115/53

## 2014-08-10 ENCOUNTER — Encounter: Payer: Self-pay | Admitting: Pulmonary Disease

## 2014-08-10 ENCOUNTER — Other Ambulatory Visit: Payer: Self-pay | Admitting: Internal Medicine

## 2014-08-10 ENCOUNTER — Telehealth: Payer: Self-pay | Admitting: *Deleted

## 2014-08-10 ENCOUNTER — Ambulatory Visit (INDEPENDENT_AMBULATORY_CARE_PROVIDER_SITE_OTHER): Payer: Medicare Other | Admitting: Pulmonary Disease

## 2014-08-10 VITALS — BP 124/80 | HR 91 | Temp 98.6°F | Ht 68.0 in | Wt 308.8 lb

## 2014-08-10 DIAGNOSIS — J9611 Chronic respiratory failure with hypoxia: Secondary | ICD-10-CM

## 2014-08-10 DIAGNOSIS — E662 Morbid (severe) obesity with alveolar hypoventilation: Secondary | ICD-10-CM

## 2014-08-10 DIAGNOSIS — J438 Other emphysema: Secondary | ICD-10-CM

## 2014-08-10 DIAGNOSIS — J441 Chronic obstructive pulmonary disease with (acute) exacerbation: Secondary | ICD-10-CM

## 2014-08-10 MED ORDER — PREDNISONE 10 MG PO TABS: ORAL_TABLET | ORAL | Status: DC

## 2014-08-10 MED ORDER — CEFDINIR 300 MG PO CAPS: ORAL_CAPSULE | ORAL | Status: DC

## 2014-08-10 NOTE — Progress Notes (Signed)
   Subjective:    [name], female    DOB: 12/04/1965, 48 y.o.   MRN: [id number]  HPI The patient comes in today for an acute sick visit. She has known COPD with chronic respiratory failure, as well as obesity hypoventilation syndrome. She was just in the hospital for a COPD flare at the end of October, but feels that she never returned to baseline. Unfortunately, she continued to smoke, and now has had increasing chest congestion, cough with purulence, and increased shortness of breath with a feeling of chest tightness. She tells me that she has been staying compliant on her medication, but she continues to be noncompliant with follow-up visits. She denies any fevers, chills, or sweats.   Review of Systems  Constitutional: Negative for fever and unexpected weight change.  HENT: Positive for congestion and postnasal drip. Negative for dental problem, ear pain, nosebleeds, rhinorrhea, sinus pressure, sneezing, sore throat and trouble swallowing.   Eyes: Negative for redness and itching.  Respiratory: Positive for cough, chest tightness, shortness of breath and wheezing.   Cardiovascular: Positive for leg swelling. Negative for palpitations.  Gastrointestinal: Negative for nausea and vomiting.  Genitourinary: Negative for dysuria.  Musculoskeletal: Negative for joint swelling.  Skin: Negative for rash.  Neurological: Negative for headaches.  Hematological: Does not bruise/bleed easily.  Psychiatric/Behavioral: Negative for dysphoric mood. The patient is not nervous/anxious.        Objective:   Physical Exam Morbidly obese female in no acute distress Nose without purulence or discharge noted No skin breakdown or pressure necrosis from the C Pap mask Neck without lymphadenopathy or thyromegaly Chest with decreased breath sounds diffusely, scattered rhonchi, no wheezes or crackles Cardiac exam with distant heart sounds, but regular Lower extremities with minimal edema,  no cyanosis Alert and oriented, moves all 4 extremities.       Assessment & Plan:

## 2014-08-10 NOTE — Assessment & Plan Note (Signed)
The patient is having an acute COPD exacerbation, and this is coming just a few weeks after being in the hospital for the same. The patient understands that she will continue to have these flareups and hospitalizations until she is able to quit smoking 100%. There are no medications that are going to make her better or keep her well at this point, and her success going forward is going to depend on whether she can stop smoking. I will treat her with results of antibiotics and prednisone to get her through this episode.

## 2014-08-10 NOTE — Telephone Encounter (Signed)
[facility] Triage Call Report Triage Record Num: [id number] Operator: Prentice Docker [place] Patient Name: [name] Call Date & Time: 08/06/2014 9:18:35PM Patient Phone: [phone] PCP: Gwendolyn Grant Patient Gender: Female PCP Fax : [phone] Patient DOB: 17-Jun-1966 [facility] Name[facility] Reason for Call: Caller: Jordane/Patient; PCP: Gwendolyn Grant (Adults only); CB#: [phone]; Call regarding Blood sugar is high;Pt is currently on prednisone. High blood sugars. Lantis taken 30u at 730pm, Victoza at 730, and took novolog 10u at 730pm, than took novalog 20 u at 81min ago, Blood sugar went up after this from 344- 367. Has only been drinking water. 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 Emergent S&S identified per Diabetes control problems: now or worsening S&S that may indicate shock" Advised 911. Caller denied 911 stating her sister will take her to [facility]. Contacted [facility] and report given to [employer]. Protocol(s) Used: Diabetes: Control Problems Recommended Outcome per Protocol: Activate EMS 911 Reason for Outcome: New or worsening signs and symptoms that may indicate shock Care Advice: ~ If blood sugar unknown or unable to test blood sugar, treat as if it is low. Test blood as soon as possible. ~ If available, bring recent log of blood sugars or bring blood glucose monitor with log of blood sugars. Lay the person down and elevate legs at least 12 inches (30 cm) above level of heart. Cover to help maintain body temperature. ~ Write down provider's name. List or place the following in a bag for transport with the patient: current prescription and/or nonprescription medications; alternative treatments, therapies and medications; and street drugs. ~ An adult should stay with the patient, preferably one trained in CPR. If the person is not trained in CPR, then he or she should provide hands-only (compression-only) CPR as  recommended by the American Heart Association. ~ 11/

## 2014-08-10 NOTE — Patient Instructions (Signed)
Will treat with omnicef 300mg , take 2 each day for 7 days Will treat with an 8 day course of prednisone. No change in your daily breathing meds.  You are on a very good treatment regimen You must stop smoking.  You will continue to get sick and go to the hospital as long as you do so.  followup with me again in 3 weeks.

## 2014-08-11 ENCOUNTER — Ambulatory Visit: Payer: Self-pay | Admitting: Internal Medicine

## 2014-08-12 ENCOUNTER — Other Ambulatory Visit: Payer: Self-pay | Admitting: Geriatric Medicine

## 2014-08-12 ENCOUNTER — Encounter: Payer: Self-pay | Admitting: Internal Medicine

## 2014-08-12 MED ORDER — MAGIC MOUTHWASH: 5.0000 mL | Freq: Three times a day (TID) | ORAL | Status: DC

## 2014-08-12 NOTE — Telephone Encounter (Signed)
Sent in prescription for Magic mouthwash. Please let patient know.

## 2014-08-13 ENCOUNTER — Encounter: Payer: Self-pay | Admitting: Internal Medicine

## 2014-08-13 NOTE — Telephone Encounter (Signed)
Med filled 07/30/14 by [facility] - not appropriate for additional refill at this time OV if problem

## 2014-08-14 ENCOUNTER — Telehealth: Payer: Self-pay | Admitting: Family

## 2014-08-14 DIAGNOSIS — R197 Diarrhea, unspecified: Secondary | ICD-10-CM

## 2014-08-14 NOTE — Progress Notes (Signed)
We are sorry that you are not feeling well.  Here is how we plan to help!  Based on what you have shared with me it looks like you have Acute Infectious Diarrhea.  Most cases of acute diarrhea are due to infections with virus and bacteria and are self-limited conditions lasting less than 14 days.  For your symptoms you may take Imodium 2 mg tablets that are over the counter at your local pharmacy. Take two tablet now and then one after each loose stool up to 6 a day.  Antibiotics are not needed for most people with diarrhea.  If your diarrhea worsens, you may need a face to face office visit evaluated your diarrhea.   HOME CARE  We recommend changing your diet to help with your symptoms for the next few days.  Drink plenty of fluids that contain water salt and sugar. Sports drinks such as Gatorade may help.   You may try broths, soups, bananas, applesauce, soft breads, mashed potatoes or crackers.   You are considered infectious for as long as the diarrhea continues. Hand washing or use of alcohol based hand sanitizers is recommend.  It is best to stay out of work or school until your symptoms stop.   GET HELP RIGHT AWAY  If you have dark yellow colored urine or do not pass urine frequently you should drink more fluids.    If your symptoms worsen   If you feel like you are going to pass out (faint)  You have a new problem  MAKE SURE YOU   Understand these instructions.  Will watch your condition.  Will get help right away if you are not doing well or get worse.  Your e-visit answers were reviewed by a board certified advanced clinical practitioner to complete your personal care plan.  Depending on the condition, your plan could have included both over the counter or prescription medications.  Please review your pharmacy choice.  If there is a problem, you may call our nursing hot line at [phone] and have the prescription routed to another pharmacy.  Your safety is  important to [place].  If you have drug allergies check your prescription carefully.    You can use MyChart to ask questions about today's visit, request a non-urgent call back, or ask for a work or school excuse.  You will get an e-mail in the next two days asking about your experience.  I hope that your e-visit has been valuable and will speed your recovery. Thank you for using e-visits.

## 2014-08-17 ENCOUNTER — Encounter (HOSPITAL_COMMUNITY): Payer: Self-pay | Admitting: Emergency Medicine

## 2014-08-17 ENCOUNTER — Emergency Department (INDEPENDENT_AMBULATORY_CARE_PROVIDER_SITE_OTHER)
Admission: EM | Admit: 2014-08-17 | Discharge: 2014-08-17 | Disposition: A | Discharge status: Home / Self Care | Payer: Medicare Other | Source: Home / Self Care | Attending: Family Medicine | Admitting: Family Medicine

## 2014-08-17 DIAGNOSIS — M1711 Unilateral primary osteoarthritis, right knee: Secondary | ICD-10-CM

## 2014-08-17 DIAGNOSIS — M179 Osteoarthritis of knee, unspecified: Secondary | ICD-10-CM | POA: Diagnosis not present

## 2014-08-17 MED ORDER — KETOROLAC TROMETHAMINE 30 MG/ML IJ SOLN
30.0000 mg | Freq: Once | INTRAMUSCULAR | Status: AC
  Administered 2014-08-17: 30 mg via INTRAMUSCULAR

## 2014-08-17 MED ORDER — KETOROLAC TROMETHAMINE 30 MG/ML IJ SOLN
INTRAMUSCULAR | Status: AC
  Filled 2014-08-17: qty 1

## 2014-08-17 MED ORDER — KETOROLAC TROMETHAMINE 10 MG PO TABS: 10.0000 mg | ORAL_TABLET | Freq: Four times a day (QID) | ORAL | Status: DC | PRN

## 2014-08-17 NOTE — Discharge Instructions (Signed)
Ice, elevate, use pain pills as needed and see Dr. [name] on tues for recheck.

## 2014-08-17 NOTE — ED Notes (Signed)
Patient says she has an appointment with dr [name] tomorrow at 4:00pm

## 2014-08-17 NOTE — ED Notes (Signed)
Right knee swollen, painful.  Has a relationship with an orthopedic-dr landaeu.  Next appt with landeau is December 2 .  Patient reports she needs a knee replacement.

## 2014-08-17 NOTE — ED Provider Notes (Signed)
CSN: [id number]     Arrival date & time 08/17/14  1500 History   First MD Initiated Contact with Patient 08/17/14 1552     Chief Complaint  Patient presents with  . Knee Pain   (Consider location/radiation/quality/duration/timing/severity/associated sxs/prior Treatment) Patient is a 48 y.o. female presenting with knee pain. The history is provided by the patient.  Knee Pain Location:  Knee Knee location:  R knee Pain details:    Quality:  Sharp   Severity:  Severe   Onset quality:  Gradual   Progression:  Worsening Chronicity:  Chronic (followed by dr [name], end stage knee, high risk surg from copd. worsening pain.) Dislocation: no     Past Medical History  Diagnosis Date  . Hypertension   . Hyperlipidemia   . Chronic headache   . Fibromyalgia     daily narcotics  . Anxiety     hx chronic BZ use, stopped 07/2010  . Anemia   . Pulmonary sarcoidosis     unimpressive CT chest 2011  . Colonic polyp   . GERD (gastroesophageal reflux disease)   . ALLERGIC RHINITIS   . Asthma   . CHF (congestive heart failure)     Diastolic with fluid overload, May, 2012, LVEF 60%  . Morbid obesity   . Depression   . Panic attacks   . Diabetes mellitus   . COPD (chronic obstructive pulmonary disease)     on home O2, moderate airflow obstruction, suspect d/t emphysema  . Obesity   . Elevated LFTs 09/2011  . Ovarian cyst   . Chronic back pain   . Sleep apnea      CPAP  . Fatty liver   . Esophagitis   . Gastritis   . Internal hemorrhoids   . Adenomatous colon polyp 12/03/09    [facility] in [place], [place]   Past Surgical History  Procedure Laterality Date  . Polypectomy  2011  . Lumbar microdiscectomy  07/06/2011    R L4-5, stern  . Back surgery    . Carpal tunnel release    . Steroid spinal injections    . Hysteroscopy w/d&c N/A 03/25/2013    Procedure: DILATATION AND CURETTAGE /HYSTEROSCOPY;  Surgeon: [name], MD;  Location: [facility];  Service: Gynecology;   Laterality: N/A;  . Dilation and curettage of uterus     Family History  Problem Relation Age of Onset  . Hypertension Mother   . Emphysema Father   . Hypertension Father   . Stomach cancer Father   . Allergies Brother   . Hypertension Brother   . Stomach cancer Brother   . Heart disease Mother   . Heart disease Father   . Heart disease Brother     died age 12 sudden death/MI   History  Substance Use Topics  . Smoking status: Former Smoker -- 0.25 packs/day for 29 years    Types: Cigarettes    Quit date: 08/09/2014  . Smokeless tobacco: Never Used     Comment: Resumed smoking September 2013 --2-3 CIGS DAILY AND E-CIG  . Alcohol Use: 0.0 oz/week    0 Not specified per week     Comment: occasionally   OB History    No data available     Review of Systems  Constitutional: Negative.   Musculoskeletal: Positive for arthralgias and gait problem. Negative for joint swelling.  Skin: Negative.     Allergies  Simvastatin; Sulfamethoxazole-trimethoprim; Zolpidem tartrate; Azithromycin; Doxycycline; Latex; Metformin and related; Metronidazole; Metolazone; and Ciprofloxacin  Home Medications   Prior to Admission medications   Medication Sig Start Date End Date Taking? Authorizing Provider  albuterol (PROVENTIL HFA;VENTOLIN HFA) 108 (90 BASE) MCG/ACT inhaler Inhale 2 puffs into the lungs every 6 (six) hours as needed for wheezing or shortness of breath.    Historical Provider, MD  albuterol (PROVENTIL) (2.5 MG/3ML) 0.083% nebulizer solution Take 2.5 mg by nebulization every 6 (six) hours as needed for wheezing or shortness of breath.    Historical Provider, MD  ALPRAZolam Duanne Moron) 1 MG tablet Take 1 mg by mouth 3 (three) times daily.    Historical Provider, MD  Alum & Mag Hydroxide-Simeth (MAGIC MOUTHWASH) SOLN Take 5 mLs by mouth 3 (three) times daily. 08/12/14   Olga Millers, MD  aspirin EC 81 MG tablet Take 81 mg by mouth daily.    Historical Provider, MD  BIOTIN PO Take 1  capsule by mouth daily.    Historical Provider, MD  budesonide-formoterol (SYMBICORT) 160-4.5 MCG/ACT inhaler Inhale 2 puffs into the lungs 2 (two) times daily.    Historical Provider, MD  cefdinir (OMNICEF) 300 MG capsule Take 2 capsules each morning x 7 days 08/10/14   Kathee Delton, MD  cetirizine (ZYRTEC) 1 MG/ML syrup Take 10 mg by mouth at bedtime.    Historical Provider, MD  Coenzyme Q10 (CO Q 10) 10 MG CAPS Take 1 capsule by mouth daily.    Historical Provider, MD  esomeprazole (NEXIUM) 40 MG capsule Take 40 mg by mouth 2 (two) times daily before a meal.    Historical Provider, MD  fluticasone (FLONASE) 50 MCG/ACT nasal spray Place 2 sprays into both nostrils daily as needed for allergies or rhinitis.    Historical Provider, MD  gabapentin (NEURONTIN) 300 MG capsule Take 300 mg by mouth at bedtime.    Historical Provider, MD  glimepiride (AMARYL) 1 MG tablet Take 1 mg by mouth daily with breakfast.    Historical Provider, MD  HYDROcodone-homatropine (HYCODAN) 5-1.5 MG/5ML syrup Take 5 mLs by mouth every 6 (six) hours as needed for cough. 07/30/14   Biagio Borg, MD  hydrOXYzine (ATARAX/VISTARIL) 10 MG tablet Take 1 tablet (10 mg total) by mouth 3 (three) times daily as needed for itching. 07/30/14   Biagio Borg, MD  insulin glargine (LANTUS) 100 UNIT/ML injection Inject 16 Units into the skin at bedtime.    Historical Provider, MD  ketorolac (TORADOL) 10 MG tablet Take 1 tablet (10 mg total) by mouth every 6 (six) hours as needed. 08/17/14   Billy Fischer, MD  Liraglutide (VICTOZA) 18 MG/3ML SOPN Inject 0.6 mg into the skin daily.    Historical Provider, MD  Multiple Vitamin (MULTIVITAMIN) capsule Take 1 capsule by mouth daily.    Historical Provider, MD  omega-3 fish oil (MAXEPA) 1000 MG CAPS capsule Take 1 capsule by mouth daily.    Historical Provider, MD  Oxycodone HCl 10 MG TABS Take 1 tablet (10 mg total) by mouth 3 (three) times daily as needed (severe pain). 07/29/14   Rowe Clack, MD  potassium chloride SA (K-DUR,KLOR-CON) 20 MEQ tablet Take 40 mEq by mouth 2 (two) times daily.    Historical Provider, MD  predniSONE (DELTASONE) 10 MG tablet Take 4 tabs daily x 2 days, 3 tabs daily x 2 days, 2 tabs daily x 2 days, 1 tab daily x 2 days then stop 08/10/14   Kathee Delton, MD  promethazine (PHENERGAN) 25 MG tablet Take 1 tablet (25 mg total)  by mouth every 6 (six) hours as needed for nausea or vomiting. 07/30/14   Biagio Borg, MD  sertraline (ZOLOFT) 100 MG tablet Take 100 mg by mouth daily.     Historical Provider, MD  spironolactone (ALDACTONE) 25 MG tablet Take 25 mg by mouth 2 (two) times daily.    Historical Provider, MD  tiotropium (SPIRIVA) 18 MCG inhalation capsule Place 18 mcg into inhaler and inhale daily.     Historical Provider, MD  tiZANidine (ZANAFLEX) 4 MG tablet Take 1 tablet (4 mg total) by mouth every 8 (eight) hours as needed for muscle spasms. 07/30/14   Biagio Borg, MD  torsemide (DEMADEX) 100 MG tablet Take 1 tablet (100 mg total) by mouth 2 (two) times daily. 07/30/14   Biagio Borg, MD   BP 137/67 mmHg  Pulse 82  Temp(Src) 98.7 F (37.1 C) (Oral)  Resp 20  SpO2 95%  LMP 10/16/2012 Physical Exam  Constitutional: She is oriented to person, place, and time. She appears well-developed and well-nourished.  Musculoskeletal: She exhibits tenderness.       Right knee: She exhibits decreased range of motion, swelling, bony tenderness and abnormal meniscus. She exhibits no effusion, no erythema, no LCL laxity, normal patellar mobility and no MCL laxity. Tenderness found. Medial joint line and lateral joint line tenderness noted.       Legs: Neurological: She is alert and oriented to person, place, and time.  Skin: Skin is warm and dry.  Nursing note and vitals reviewed.   ED Course  Procedures (including critical care time) Labs Review Labs Reviewed - No data to display  Imaging Review No results found.   MDM   1. Osteoarthritis of  right knee, unspecified osteoarthritis type        Billy Fischer, MD 08/17/14 [phone]

## 2014-08-19 ENCOUNTER — Encounter: Payer: Self-pay | Admitting: Internal Medicine

## 2014-08-22 ENCOUNTER — Other Ambulatory Visit: Payer: Self-pay | Admitting: Pulmonary Disease

## 2014-08-23 ENCOUNTER — Telehealth: Payer: Self-pay | Admitting: Family

## 2014-08-23 DIAGNOSIS — R197 Diarrhea, unspecified: Secondary | ICD-10-CM

## 2014-08-23 NOTE — Progress Notes (Signed)
We are sorry that you are not feeling well.  Here is how we plan to help!  Based on what you have shared with me it looks like you have Acute Infectious Diarrhea.  Most cases of acute diarrhea are due to infections with virus and bacteria and are self-limited conditions lasting less than 14 days.  For your symptoms you may take Imodium 2 mg tablets that are over the counter at your local pharmacy. Take two tablet now and then one after each loose stool up to 6 a day.  Antibiotics are not needed for most people with diarrhea.  *If these symptoms worsen or do not improve in the next couple of days, you may need to see your Primary Care provider. You could have a more serious infection that may need antibiotics. This would need to be diagnosed with a stool sample.  HOME CARE  We recommend changing your diet to help with your symptoms for the next few days.  Drink plenty of fluids that contain water salt and sugar. Sports drinks such as Gatorade may help.   You may try broths, soups, bananas, applesauce, soft breads, mashed potatoes or crackers.   You are considered infectious for as long as the diarrhea continues. Hand washing or use of alcohol based hand sanitizers is recommend.  It is best to stay out of work or school until your symptoms stop.   GET HELP RIGHT AWAY  If you have dark yellow colored urine or do not pass urine frequently you should drink more fluids.    If your symptoms worsen   If you feel like you are going to pass out (faint)  You have a new problem  MAKE SURE YOU   Understand these instructions.  Will watch your condition.  Will get help right away if you are not doing well or get worse.  Your e-visit answers were reviewed by a board certified advanced clinical practitioner to complete your personal care plan.  Depending on the condition, your plan could have included both over the counter or prescription medications.  Please review your pharmacy choice.   If there is a problem, you may call our nursing hot line at [phone] and have the prescription routed to another pharmacy.  Your safety is important to [place].  If you have drug allergies check your prescription carefully.    You can use MyChart to ask questions about today's visit, request a non-urgent call back, or ask for a work or school excuse.  You will get an e-mail in the next two days asking about your experience.  I hope that your e-visit has been valuable and will speed your recovery. Thank you for using e-visits.

## 2014-08-26 ENCOUNTER — Other Ambulatory Visit: Payer: Self-pay | Admitting: Internal Medicine

## 2014-08-27 ENCOUNTER — Other Ambulatory Visit: Payer: Self-pay | Admitting: Internal Medicine

## 2014-08-28 ENCOUNTER — Encounter: Payer: Self-pay | Admitting: Pulmonary Disease

## 2014-08-31 ENCOUNTER — Ambulatory Visit: Payer: Self-pay | Admitting: Pulmonary Disease

## 2014-09-01 ENCOUNTER — Telehealth: Payer: Self-pay | Admitting: Internal Medicine

## 2014-09-01 ENCOUNTER — Other Ambulatory Visit: Payer: Self-pay

## 2014-09-01 MED ORDER — OXYCODONE HCL 10 MG PO TABS: 10.0000 mg | ORAL_TABLET | Freq: Three times a day (TID) | ORAL | Status: DC | PRN

## 2014-09-01 NOTE — Telephone Encounter (Signed)
Patient is requesting oxycodone HCL 10mg  script

## 2014-09-02 ENCOUNTER — Encounter: Payer: Self-pay | Admitting: Internal Medicine

## 2014-09-02 ENCOUNTER — Ambulatory Visit: Payer: Self-pay | Admitting: Pulmonary Disease

## 2014-09-03 ENCOUNTER — Ambulatory Visit: Payer: Self-pay | Admitting: Family

## 2014-09-03 NOTE — Telephone Encounter (Signed)
rx done and pt picked up.

## 2014-09-07 ENCOUNTER — Other Ambulatory Visit: Payer: Self-pay | Admitting: Internal Medicine

## 2014-09-14 ENCOUNTER — Ambulatory Visit: Payer: Medicare Other | Admitting: Pulmonary Disease

## 2014-09-21 ENCOUNTER — Encounter: Payer: Self-pay | Admitting: Internal Medicine

## 2014-09-21 ENCOUNTER — Other Ambulatory Visit: Payer: Self-pay | Admitting: Internal Medicine

## 2014-09-26 ENCOUNTER — Other Ambulatory Visit: Payer: Self-pay | Admitting: Internal Medicine

## 2014-09-28 ENCOUNTER — Other Ambulatory Visit: Payer: Self-pay

## 2014-09-28 ENCOUNTER — Ambulatory Visit (INDEPENDENT_AMBULATORY_CARE_PROVIDER_SITE_OTHER): Payer: Medicare Other | Admitting: Adult Health

## 2014-09-28 ENCOUNTER — Encounter: Payer: Self-pay | Admitting: Adult Health

## 2014-09-28 VITALS — BP 110/66 | HR 98 | Temp 98.1°F | Ht 68.0 in | Wt 304.5 lb

## 2014-09-28 DIAGNOSIS — J441 Chronic obstructive pulmonary disease with (acute) exacerbation: Secondary | ICD-10-CM

## 2014-09-28 MED ORDER — HYDROCODONE-HOMATROPINE 5-1.5 MG/5ML PO SYRP: 5.0000 mL | ORAL_SOLUTION | Freq: Four times a day (QID) | ORAL | Status: DC | PRN

## 2014-09-28 MED ORDER — OXYCODONE HCL 10 MG PO TABS: 10.0000 mg | ORAL_TABLET | Freq: Three times a day (TID) | ORAL | Status: DC | PRN

## 2014-09-28 MED ORDER — PREDNISONE 10 MG PO TABS: ORAL_TABLET | ORAL | Status: DC

## 2014-09-28 MED ORDER — AMOXICILLIN-POT CLAVULANATE 875-125 MG PO TABS: 1.0000 | ORAL_TABLET | Freq: Two times a day (BID) | ORAL | Status: AC

## 2014-09-28 MED ORDER — INSULIN PEN NEEDLE 31G X 8 MM MISC: Status: DC

## 2014-09-28 NOTE — Telephone Encounter (Signed)
Signed for pick up.

## 2014-09-28 NOTE — Assessment & Plan Note (Signed)
Exacerbation   Plan  Augmentin 875mg  Twice daily  For 7 days  Prednisone taper over next week .  Call if blood sugars are over >250.  Hydromet 1 tsp every 6 hr As needed for cough  , may make you sleepy Mucinex DM Twice daily As needed  Cough/congestion  Limit excessive fluid intake  Continue on BIPAP At bedtime   Oxygen 3l/m rest and 4 l/m with walking  Continue on low salt diet  Keep legs elevated.  Please contact office for sooner follow up if symptoms do not improve or worsen or seek emergency care  Follow up Dr. [name] in 6  weeks and As needed

## 2014-09-28 NOTE — Progress Notes (Signed)
   Subjective:    [name], female    DOB: 21-May-1966, 49 y.o.   MRN: [id number]  HPI 49 yo female with known hx of COPD and obesity hypoventilation syndrome with chronic respiratory failure, as well as sleep apnea.  Known  diastolic dysfunction with a tendency toward fluid overload.    09/28/2014 Acute OV  Complains of hoarseness, chest congestion, thick green mucus for 2 weeks.   Taking mucinex without much help.  Reports quit smoking but vaping.  Cough is keeping her up at night.  Out of cough syrup, would like refill  Denies any f/c/s, hemoptysis, nausea, vomiting.  On BIPAP At bedtime  , 3 liters O2 24/7  Symbicort 2 puffs Twice daily  And spiriva   Says DM under good control .    Review of Systems Constitutional:   No  weight loss, night sweats,  Fevers, chills, +fatigue, or  lassitude.  HEENT:   No headaches,  Difficulty swallowing,  Tooth/dental problems, or  Sore throat,                No sneezing, itching, ear ache,  +nasal congestion, post nasal drip,   CV:  No chest pain,  Orthopnea, PND, swelling in lower extremities, anasarca, dizziness, palpitations, syncope.   GI  No heartburn, indigestion, abdominal pain, nausea, vomiting, diarrhea, change in bowel habits, loss of appetite, bloody stools.   Resp:  .  No chest wall deformity  Skin: no rash or lesions.  GU: no dysuria, change in color of urine, no urgency or frequency.  No flank pain, no hematuria   MS:  No joint pain or swelling.  No decreased range of motion.  No back pain.  Psych:  No change in mood or affect. No depression or anxiety.  No memory loss.         Objective:   Physical Exam GEN: A/Ox3; pleasant , NAD, obese, on O2   HEENT:  [place]/AT,  EACs-clear, TMs-wnl, NOSE-clear drainage  THROAT-clear, no lesions, no postnasal drip or exudate noted.   NECK:  Supple w/ fair ROM; no JVD; normal carotid impulses w/o bruits; no thyromegaly or nodules palpated; no  lymphadenopathy.  RESP  Few exp wheezes .no accessory muscle use, no dullness to percussion  CARD:  RRR, no m/r/g  , no peripheral edema, pulses intact, no cyanosis or clubbing.  GI:   Soft & nt; nml bowel sounds; no organomegaly or masses detected.  Musco: Warm bil, no deformities or joint swelling noted.   Neuro: alert, no focal deficits noted.    Skin: Warm, no lesions or rashes         Assessment & Plan:

## 2014-09-28 NOTE — Patient Instructions (Addendum)
Augmentin 875mg  Twice daily  For 7 days  Prednisone taper over next week .  Call if blood sugars are over >250.  Hydromet 1 tsp every 6 hr As needed for cough  , may make you sleepy Mucinex DM Twice daily As needed  Cough/congestion  Limit excessive fluid intake  Continue on BIPAP At bedtime   Oxygen 3l/m rest and 4 l/m with walking  Continue on low salt diet  Keep legs elevated.  Please contact office for sooner follow up if symptoms do not improve or worsen or seek emergency care  Follow up Dr. [name] in 6  weeks and As needed

## 2014-09-28 NOTE — Progress Notes (Signed)
Ov reviewed, and agree with plan as outlined.  

## 2014-09-29 ENCOUNTER — Telehealth: Payer: Self-pay | Admitting: *Deleted

## 2014-09-29 ENCOUNTER — Ambulatory Visit: Payer: Self-pay | Admitting: Internal Medicine

## 2014-09-29 NOTE — Telephone Encounter (Signed)
-----   Message from Kathline Magic sent at 09/29/2014  8:22 AM EST ----- Pt called to r/s appt for this afternoon. She is very sick. Would you like to charge?

## 2014-09-29 NOTE — Telephone Encounter (Signed)
No charge but has to have a next available appointment.

## 2014-10-01 DIAGNOSIS — M542 Cervicalgia: Secondary | ICD-10-CM | POA: Diagnosis not present

## 2014-10-05 DIAGNOSIS — Q762 Congenital spondylolisthesis: Secondary | ICD-10-CM | POA: Diagnosis not present

## 2014-10-06 ENCOUNTER — Telehealth: Payer: Self-pay

## 2014-10-06 NOTE — Telephone Encounter (Signed)
Noted.  [facility] will [facility] all [facility] to [facility] so long as patient is actively going to them for care Please let patient and [facility] at [facility] know same

## 2014-10-06 NOTE — Telephone Encounter (Signed)
Pt is informed that [name] contacted [place] regarding taking over pt controlled substance pain meds and to let us know when/if she stops going to them.  Pt stated understanding.   Pt also asked about her current sx: Added that conversation to a different phone note.

## 2014-10-06 NOTE — Telephone Encounter (Signed)
Joy from the [facility] called and wanted to inform us that [name] (pt) is getting hydro from their office. Informed Joy that rx of Oxy was picked up on 09/28/14 at 2:32pm and pt picked up.   Please advise what all needs to be done to prevent pt from receiving rx for narcotics from 2 MD's.

## 2014-10-06 NOTE — Telephone Encounter (Signed)
Pt stated that she has a yeast infection now since the abx that she took.   Pt also states that the cough is getting deeper and not getting any better. Other sx: chills and achy with some swelling under chin.

## 2014-10-07 ENCOUNTER — Ambulatory Visit: Payer: Self-pay | Admitting: Family

## 2014-10-07 DIAGNOSIS — R0602 Shortness of breath: Secondary | ICD-10-CM | POA: Diagnosis not present

## 2014-10-07 MED ORDER — FLUCONAZOLE 150 MG PO TABS: 150.0000 mg | ORAL_TABLET | Freq: Once | ORAL | Status: DC

## 2014-10-07 NOTE — Telephone Encounter (Signed)
FYI: pt has appt with Marya Amsler this morning.

## 2014-10-07 NOTE — Telephone Encounter (Signed)
Diflucan tablet with 1 refill sent to pharmacy to treat yeast symptoms Needs office visit for acute cough symptoms -any provider okay

## 2014-10-09 NOTE — Telephone Encounter (Signed)
FYI: I never did see this patient.

## 2014-10-11 ENCOUNTER — Other Ambulatory Visit: Payer: Self-pay | Admitting: Internal Medicine

## 2014-10-12 ENCOUNTER — Other Ambulatory Visit: Payer: Self-pay | Admitting: Internal Medicine

## 2014-10-17 ENCOUNTER — Other Ambulatory Visit: Payer: Self-pay | Admitting: Internal Medicine

## 2014-10-18 ENCOUNTER — Other Ambulatory Visit: Payer: Self-pay | Admitting: Pulmonary Disease

## 2014-10-18 ENCOUNTER — Inpatient Hospital Stay (HOSPITAL_COMMUNITY)
Admission: EM | Admit: 2014-10-18 | Discharge: 2014-10-24 | DRG: 190 | Disposition: A | Discharge status: Home / Self Care | Payer: Medicare Other | Attending: Internal Medicine | Admitting: Internal Medicine

## 2014-10-18 ENCOUNTER — Encounter (HOSPITAL_COMMUNITY): Payer: Self-pay | Admitting: *Deleted

## 2014-10-18 ENCOUNTER — Emergency Department (HOSPITAL_COMMUNITY): Payer: Medicare Other

## 2014-10-18 DIAGNOSIS — G4730 Sleep apnea, unspecified: Secondary | ICD-10-CM | POA: Diagnosis present

## 2014-10-18 DIAGNOSIS — Z7951 Long term (current) use of inhaled steroids: Secondary | ICD-10-CM | POA: Diagnosis not present

## 2014-10-18 DIAGNOSIS — F410 Panic disorder [episodic paroxysmal anxiety] without agoraphobia: Secondary | ICD-10-CM | POA: Diagnosis not present

## 2014-10-18 DIAGNOSIS — F419 Anxiety disorder, unspecified: Secondary | ICD-10-CM | POA: Diagnosis not present

## 2014-10-18 DIAGNOSIS — I5032 Chronic diastolic (congestive) heart failure: Secondary | ICD-10-CM | POA: Diagnosis present

## 2014-10-18 DIAGNOSIS — J9621 Acute and chronic respiratory failure with hypoxia: Secondary | ICD-10-CM | POA: Diagnosis present

## 2014-10-18 DIAGNOSIS — E119 Type 2 diabetes mellitus without complications: Secondary | ICD-10-CM | POA: Diagnosis present

## 2014-10-18 DIAGNOSIS — Z881 Allergy status to other antibiotic agents status: Secondary | ICD-10-CM

## 2014-10-18 DIAGNOSIS — D649 Anemia, unspecified: Secondary | ICD-10-CM | POA: Diagnosis present

## 2014-10-18 DIAGNOSIS — Z86010 Personal history of colonic polyps: Secondary | ICD-10-CM | POA: Diagnosis not present

## 2014-10-18 DIAGNOSIS — Z888 Allergy status to other drugs, medicaments and biological substances status: Secondary | ICD-10-CM

## 2014-10-18 DIAGNOSIS — Z87891 Personal history of nicotine dependence: Secondary | ICD-10-CM | POA: Diagnosis not present

## 2014-10-18 DIAGNOSIS — I10 Essential (primary) hypertension: Secondary | ICD-10-CM | POA: Diagnosis not present

## 2014-10-18 DIAGNOSIS — K760 Fatty (change of) liver, not elsewhere classified: Secondary | ICD-10-CM | POA: Diagnosis not present

## 2014-10-18 DIAGNOSIS — J45909 Unspecified asthma, uncomplicated: Secondary | ICD-10-CM | POA: Diagnosis not present

## 2014-10-18 DIAGNOSIS — G8929 Other chronic pain: Secondary | ICD-10-CM | POA: Diagnosis not present

## 2014-10-18 DIAGNOSIS — Z794 Long term (current) use of insulin: Secondary | ICD-10-CM

## 2014-10-18 DIAGNOSIS — R0600 Dyspnea, unspecified: Secondary | ICD-10-CM

## 2014-10-18 DIAGNOSIS — K219 Gastro-esophageal reflux disease without esophagitis: Secondary | ICD-10-CM | POA: Diagnosis not present

## 2014-10-18 DIAGNOSIS — Z7982 Long term (current) use of aspirin: Secondary | ICD-10-CM | POA: Diagnosis not present

## 2014-10-18 DIAGNOSIS — Z9981 Dependence on supplemental oxygen: Secondary | ICD-10-CM

## 2014-10-18 DIAGNOSIS — R0602 Shortness of breath: Secondary | ICD-10-CM

## 2014-10-18 DIAGNOSIS — J441 Chronic obstructive pulmonary disease with (acute) exacerbation: Principal | ICD-10-CM | POA: Diagnosis present

## 2014-10-18 DIAGNOSIS — Z6841 Body Mass Index (BMI) 40.0 and over, adult: Secondary | ICD-10-CM

## 2014-10-18 DIAGNOSIS — J984 Other disorders of lung: Secondary | ICD-10-CM | POA: Diagnosis not present

## 2014-10-18 DIAGNOSIS — N179 Acute kidney failure, unspecified: Secondary | ICD-10-CM | POA: Diagnosis not present

## 2014-10-18 DIAGNOSIS — E785 Hyperlipidemia, unspecified: Secondary | ICD-10-CM | POA: Diagnosis present

## 2014-10-18 DIAGNOSIS — R0902 Hypoxemia: Secondary | ICD-10-CM | POA: Insufficient documentation

## 2014-10-18 DIAGNOSIS — Z91040 Latex allergy status: Secondary | ICD-10-CM

## 2014-10-18 DIAGNOSIS — J9819 Other pulmonary collapse: Secondary | ICD-10-CM | POA: Diagnosis not present

## 2014-10-18 DIAGNOSIS — Z882 Allergy status to sulfonamides status: Secondary | ICD-10-CM | POA: Diagnosis not present

## 2014-10-18 DIAGNOSIS — F329 Major depressive disorder, single episode, unspecified: Secondary | ICD-10-CM | POA: Diagnosis present

## 2014-10-18 DIAGNOSIS — R05 Cough: Secondary | ICD-10-CM | POA: Diagnosis not present

## 2014-10-18 DIAGNOSIS — I509 Heart failure, unspecified: Secondary | ICD-10-CM | POA: Insufficient documentation

## 2014-10-18 DIAGNOSIS — J449 Chronic obstructive pulmonary disease, unspecified: Secondary | ICD-10-CM | POA: Diagnosis not present

## 2014-10-18 LAB — CBC
HCT: 41.7 % (ref 36.0–46.0)
Hemoglobin: 13.4 g/dL (ref 12.0–15.0)
MCH: 24.5 pg — ABNORMAL LOW (ref 26.0–34.0)
MCHC: 32.1 g/dL (ref 30.0–36.0)
MCV: 76.4 fL — ABNORMAL LOW (ref 78.0–100.0)
Platelets: 180 10*3/uL (ref 150–400)
RBC: 5.46 MIL/uL — ABNORMAL HIGH (ref 3.87–5.11)
RDW: 17.7 % — ABNORMAL HIGH (ref 11.5–15.5)
WBC: 5.8 10*3/uL (ref 4.0–10.5)

## 2014-10-18 LAB — BASIC METABOLIC PANEL
Anion gap: 8 (ref 5–15)
BUN: 15 mg/dL (ref 6–23)
CO2: 32 mmol/L (ref 19–32)
Calcium: 9.9 mg/dL (ref 8.4–10.5)
Chloride: 96 mmol/L (ref 96–112)
Creatinine, Ser: 1.07 mg/dL (ref 0.50–1.10)
GFR calc Af Amer: 70 mL/min — ABNORMAL LOW (ref >90)
GFR calc non Af Amer: 61 mL/min — ABNORMAL LOW (ref >90)
Glucose, Bld: 220 mg/dL — ABNORMAL HIGH (ref 70–99)
Potassium: 3.7 mmol/L (ref 3.5–5.1)
Sodium: 136 mmol/L (ref 135–145)

## 2014-10-18 LAB — GLUCOSE, CAPILLARY: GLUCOSE-CAPILLARY: 283 mg/dL — AB (ref 70–99)

## 2014-10-18 LAB — BRAIN NATRIURETIC PEPTIDE: B Natriuretic Peptide: 11.1 pg/mL (ref 0.0–100.0)

## 2014-10-18 LAB — TROPONIN I: Troponin I: <0.03 ng/mL (ref <0.031)

## 2014-10-18 LAB — I-STAT TROPONIN, ED: Troponin i, poc: 0 ng/mL (ref 0.00–0.08)

## 2014-10-18 MED ORDER — DM-GUAIFENESIN ER 30-600 MG PO TB12
1.0000 | ORAL_TABLET | Freq: Two times a day (BID) | ORAL | Status: DC
Start: 2014-10-18 — End: 2014-10-24
  Administered 2014-10-18 – 2014-10-24 (×12): 1 via ORAL
  Filled 2014-10-18 (×13): qty 1

## 2014-10-18 MED ORDER — OXYCODONE HCL 5 MG PO TABS
10.0000 mg | ORAL_TABLET | Freq: Three times a day (TID) | ORAL | Status: DC | PRN
  Administered 2014-10-18 – 2014-10-19 (×2): 10 mg via ORAL
  Filled 2014-10-18 (×2): qty 2

## 2014-10-18 MED ORDER — NICOTINE 7 MG/24HR TD PT24
7.0000 mg | MEDICATED_PATCH | Freq: Every day | TRANSDERMAL | Status: DC
  Administered 2014-10-18 – 2014-10-19 (×2): 7 mg via TRANSDERMAL
  Filled 2014-10-18 (×2): qty 1

## 2014-10-18 MED ORDER — GABAPENTIN 300 MG PO CAPS
300.0000 mg | ORAL_CAPSULE | Freq: Every evening | ORAL | Status: DC | PRN
  Filled 2014-10-18: qty 1

## 2014-10-18 MED ORDER — FLUTICASONE PROPIONATE 50 MCG/ACT NA SUSP: 2.0000 | Freq: Every day | NASAL | Status: DC | PRN

## 2014-10-18 MED ORDER — IPRATROPIUM-ALBUTEROL 0.5-2.5 (3) MG/3ML IN SOLN
3.0000 mL | RESPIRATORY_TRACT | Status: DC
  Administered 2014-10-18 – 2014-10-20 (×8): 3 mL via RESPIRATORY_TRACT
  Filled 2014-10-18 (×8): qty 3

## 2014-10-18 MED ORDER — TORSEMIDE 100 MG PO TABS
100.0000 mg | ORAL_TABLET | Freq: Two times a day (BID) | ORAL | Status: DC
  Administered 2014-10-19 – 2014-10-23 (×9): 100 mg via ORAL
  Filled 2014-10-18 (×11): qty 1

## 2014-10-18 MED ORDER — PREDNISONE 20 MG PO TABS
60.0000 mg | ORAL_TABLET | Freq: Once | ORAL | Status: AC
  Administered 2014-10-18: 60 mg via ORAL
  Filled 2014-10-18: qty 3

## 2014-10-18 MED ORDER — ENOXAPARIN SODIUM 40 MG/0.4ML ~~LOC~~ SOLN
40.0000 mg | SUBCUTANEOUS | Status: DC
  Administered 2014-10-18 – 2014-10-23 (×6): 40 mg via SUBCUTANEOUS
  Filled 2014-10-18 (×7): qty 0.4

## 2014-10-18 MED ORDER — SPIRONOLACTONE 25 MG PO TABS
25.0000 mg | ORAL_TABLET | Freq: Two times a day (BID) | ORAL | Status: DC
  Administered 2014-10-19 – 2014-10-23 (×9): 25 mg via ORAL
  Filled 2014-10-18 (×12): qty 1

## 2014-10-18 MED ORDER — PANTOPRAZOLE SODIUM 40 MG PO TBEC
40.0000 mg | DELAYED_RELEASE_TABLET | Freq: Every day | ORAL | Status: DC
  Administered 2014-10-18 – 2014-10-24 (×7): 40 mg via ORAL
  Filled 2014-10-18 (×7): qty 1

## 2014-10-18 MED ORDER — IPRATROPIUM BROMIDE 0.02 % IN SOLN
0.5000 mg | Freq: Once | RESPIRATORY_TRACT | Status: AC
  Administered 2014-10-18: 0.5 mg via RESPIRATORY_TRACT
  Filled 2014-10-18: qty 2.5

## 2014-10-18 MED ORDER — ALPRAZOLAM 0.5 MG PO TABS
1.0000 mg | ORAL_TABLET | Freq: Three times a day (TID) | ORAL | Status: DC
  Administered 2014-10-18 – 2014-10-24 (×17): 1 mg via ORAL
  Filled 2014-10-18 (×17): qty 2

## 2014-10-18 MED ORDER — SERTRALINE HCL 100 MG PO TABS
100.0000 mg | ORAL_TABLET | Freq: Every day | ORAL | Status: DC
  Administered 2014-10-20 – 2014-10-24 (×4): 100 mg via ORAL
  Filled 2014-10-18 (×6): qty 1

## 2014-10-18 MED ORDER — ASPIRIN EC 81 MG PO TBEC
81.0000 mg | DELAYED_RELEASE_TABLET | Freq: Every day | ORAL | Status: DC
  Administered 2014-10-19 – 2014-10-24 (×6): 81 mg via ORAL
  Filled 2014-10-18 (×6): qty 1

## 2014-10-18 MED ORDER — INSULIN ASPART 100 UNIT/ML ~~LOC~~ SOLN
0.0000 [IU] | Freq: Three times a day (TID) | SUBCUTANEOUS | Status: DC
  Administered 2014-10-19: 11 [IU] via SUBCUTANEOUS
  Administered 2014-10-19: 8 [IU] via SUBCUTANEOUS
  Administered 2014-10-19 – 2014-10-20 (×2): 11 [IU] via SUBCUTANEOUS
  Administered 2014-10-20 (×2): 15 [IU] via SUBCUTANEOUS
  Administered 2014-10-21: 11 [IU] via SUBCUTANEOUS
  Administered 2014-10-21 – 2014-10-22 (×3): 15 [IU] via SUBCUTANEOUS
  Administered 2014-10-22: 11 [IU] via SUBCUTANEOUS
  Administered 2014-10-22: 15 [IU] via SUBCUTANEOUS
  Administered 2014-10-23: 8 [IU] via SUBCUTANEOUS
  Administered 2014-10-23: 11 [IU] via SUBCUTANEOUS
  Administered 2014-10-23: 15 [IU] via SUBCUTANEOUS
  Administered 2014-10-24: 8 [IU] via SUBCUTANEOUS

## 2014-10-18 MED ORDER — FLUCONAZOLE 150 MG PO TABS
150.0000 mg | ORAL_TABLET | Freq: Once | ORAL | Status: AC
  Administered 2014-10-18: 150 mg via ORAL
  Filled 2014-10-18: qty 1

## 2014-10-18 MED ORDER — ALBUTEROL (5 MG/ML) CONTINUOUS INHALATION SOLN
10.0000 mg/h | INHALATION_SOLUTION | Freq: Once | RESPIRATORY_TRACT | Status: AC
  Administered 2014-10-18: 10 mg/h via RESPIRATORY_TRACT
  Filled 2014-10-18: qty 20

## 2014-10-18 MED ORDER — INSULIN ASPART 100 UNIT/ML ~~LOC~~ SOLN
0.0000 [IU] | Freq: Every day | SUBCUTANEOUS | Status: DC
  Administered 2014-10-18: 3 [IU] via SUBCUTANEOUS
  Administered 2014-10-19 – 2014-10-21 (×3): 5 [IU] via SUBCUTANEOUS
  Administered 2014-10-22: 4 [IU] via SUBCUTANEOUS

## 2014-10-18 MED ORDER — TIZANIDINE HCL 4 MG PO TABS
4.0000 mg | ORAL_TABLET | Freq: Three times a day (TID) | ORAL | Status: DC | PRN
  Administered 2014-10-23 – 2014-10-24 (×2): 4 mg via ORAL
  Filled 2014-10-18 (×4): qty 1

## 2014-10-18 MED ORDER — HYDROXYZINE HCL 10 MG PO TABS
10.0000 mg | ORAL_TABLET | Freq: Three times a day (TID) | ORAL | Status: DC | PRN
  Filled 2014-10-18 (×2): qty 1

## 2014-10-18 MED ORDER — ALBUTEROL (5 MG/ML) CONTINUOUS INHALATION SOLN
10.0000 mg/h | INHALATION_SOLUTION | RESPIRATORY_TRACT | Status: DC
  Administered 2014-10-18: 10 mg/h via RESPIRATORY_TRACT

## 2014-10-18 MED ORDER — METHYLPREDNISOLONE SODIUM SUCC 125 MG IJ SOLR
60.0000 mg | Freq: Four times a day (QID) | INTRAMUSCULAR | Status: DC
  Administered 2014-10-18 – 2014-10-22 (×14): 60 mg via INTRAVENOUS
  Filled 2014-10-18: qty 2
  Filled 2014-10-18 (×2): qty 0.96
  Filled 2014-10-18: qty 2
  Filled 2014-10-18 (×7): qty 0.96
  Filled 2014-10-18: qty 2
  Filled 2014-10-18 (×2): qty 0.96
  Filled 2014-10-18: qty 2
  Filled 2014-10-18 (×5): qty 0.96

## 2014-10-18 NOTE — ED Provider Notes (Signed)
CSN: [id number]     Arrival date & time 10/18/14  1323 History   First MD Initiated Contact with Patient 10/18/14 1505     Chief Complaint  Patient presents with  . Shortness of Breath     (Consider location/radiation/quality/duration/timing/severity/associated sxs/prior Treatment) HPI Comments: Patient reports 2-3 months of increased shortness of breath on exertion and with lying flat with productive cough.  Symptoms have been worse for past 2-3 days.  She was on a course of cough syrup, prednisone and amoxicillin about 3 weeks ago which did not improve symptoms.  Today she had 2-3 episodes of a sharp midsternal chest pain lasting seconds.  She's no chest pain currently.  She has had chills but no fever.  She has had mild edema in her hands and her feet which she has noticed today.  She reports her home O2, monitor has been lower recently.  She has been compliant with her home O2 and BiPAP  Patient is a 49 y.o. female presenting with shortness of breath.  Shortness of Breath Associated symptoms: chest pain and cough   Associated symptoms: no abdominal pain, no diaphoresis, no fever, no headaches, no neck pain, no sore throat and no vomiting     Past Medical History  Diagnosis Date  . Hypertension   . Hyperlipidemia   . Chronic headache   . Fibromyalgia     daily narcotics  . Anxiety     hx chronic BZ use, stopped 07/2010  . Anemia   . Pulmonary sarcoidosis     unimpressive CT chest 2011  . Colonic polyp   . GERD (gastroesophageal reflux disease)   . ALLERGIC RHINITIS   . Asthma   . CHF (congestive heart failure)     Diastolic with fluid overload, May, 2012, LVEF 60%  . Morbid obesity   . Depression   . Panic attacks   . Diabetes mellitus   . COPD (chronic obstructive pulmonary disease)     on home O2, moderate airflow obstruction, suspect d/t emphysema  . Obesity   . Elevated LFTs 09/2011  . Ovarian cyst   . Chronic back pain   . Sleep apnea      CPAP  . Fatty liver    . Esophagitis   . Gastritis   . Internal hemorrhoids   . Adenomatous colon polyp 12/03/09    [facility] in [place], [place]   Past Surgical History  Procedure Laterality Date  . Polypectomy  2011  . Lumbar microdiscectomy  07/06/2011    R L4-5, stern  . Back surgery    . Carpal tunnel release    . Steroid spinal injections    . Hysteroscopy w/d&c N/A 03/25/2013    Procedure: DILATATION AND CURETTAGE /HYSTEROSCOPY;  Surgeon: [name], MD;  Location: [facility];  Service: Gynecology;  Laterality: N/A;  . Dilation and curettage of uterus     Family History  Problem Relation Age of Onset  . Hypertension Mother   . Emphysema Father   . Hypertension Father   . Stomach cancer Father   . Allergies Brother   . Hypertension Brother   . Stomach cancer Brother   . Heart disease Mother   . Heart disease Father   . Heart disease Brother     died age 56 sudden death/MI   History  Substance Use Topics  . Smoking status: Former Smoker -- 0.25 packs/day for 29 years    Types: Cigarettes    Quit date: 08/09/2014  . Smokeless  tobacco: Never Used     Comment: smoking e-cigs daily  . Alcohol Use: 0.0 oz/week    0 Not specified per week     Comment: occasionally   OB History    No data available     Review of Systems  Constitutional: Negative for fever, chills, diaphoresis, activity change, appetite change and fatigue.  HENT: Negative for congestion, facial swelling, rhinorrhea and sore throat.   Eyes: Negative for photophobia and discharge.  Respiratory: Positive for cough and shortness of breath. Negative for chest tightness.   Cardiovascular: Positive for chest pain and leg swelling. Negative for palpitations.  Gastrointestinal: Negative for nausea, vomiting, abdominal pain and diarrhea.  Endocrine: Negative for polydipsia and polyuria.  Genitourinary: Negative for dysuria, frequency, difficulty urinating and pelvic pain.  Musculoskeletal: Negative for back pain, arthralgias,  neck pain and neck stiffness.  Skin: Negative for color change and wound.  Allergic/Immunologic: Negative for immunocompromised state.  Neurological: Negative for facial asymmetry, weakness, numbness and headaches.  Hematological: Does not bruise/bleed easily.  Psychiatric/Behavioral: Negative for confusion and agitation.      Allergies  Simvastatin; Sulfamethoxazole-trimethoprim; Zolpidem tartrate; Azithromycin; Doxycycline; Latex; Metformin and related; Metronidazole; Metolazone; and Ciprofloxacin  Home Medications   Prior to Admission medications   Medication Sig Start Date End Date Taking? Authorizing Provider  albuterol (PROVENTIL) (2.5 MG/3ML) 0.083% nebulizer solution INHALE THE CONTENTS OF 1 VIAL VIA NEBULIZER EVERY 6 HOURS AS NEEDED FOR WHEEZING OR SHORTNESS OF BREATH 10/13/14  Yes Rowe Clack, MD  ALPRAZolam Duanne Moron) 1 MG tablet Take 1 mg by mouth 3 (three) times daily.   Yes Historical Provider, MD  aspirin EC 81 MG tablet Take 81 mg by mouth daily.   Yes Historical Provider, MD  BIOTIN PO Take 1 capsule by mouth daily.   Yes Historical Provider, MD  budesonide-formoterol (SYMBICORT) 160-4.5 MCG/ACT inhaler Inhale 2 puffs into the lungs 2 (two) times daily.   Yes Historical Provider, MD  cetirizine (ZYRTEC) 1 MG/ML syrup TAKE 10 ML BY MOUTH EVERY NIGHT AT BEDTIME 09/07/14  Yes Rowe Clack, MD  Coenzyme Q10 (CO Q 10) 10 MG CAPS Take 1 capsule by mouth daily.   Yes Historical Provider, MD  esomeprazole (NEXIUM) 40 MG capsule Take 40 mg by mouth 2 (two) times daily before a meal.   Yes Historical Provider, MD  fluticasone (FLONASE) 50 MCG/ACT nasal spray Place 2 sprays into both nostrils daily as needed for allergies or rhinitis.   Yes Historical Provider, MD  gabapentin (NEURONTIN) 300 MG capsule Take 300 mg by mouth at bedtime.   Yes Historical Provider, MD  glimepiride (AMARYL) 1 MG tablet Take 1 mg by mouth daily with breakfast.   Yes Historical Provider, MD   hydrOXYzine (ATARAX/VISTARIL) 10 MG tablet TAKE 1 TABLET BY MOUTH THREE TIMES DAILY AS NEEDED FOR ITCHING 08/27/14  Yes Biagio Borg, MD  insulin aspart (NOVOLOG FLEXPEN) 100 UNIT/ML FlexPen Use as directed per sliding scale indicated by your physician. 09/22/14  Yes Rowe Clack, MD  Insulin Glargine (LANTUS SOLOSTAR) 100 UNIT/ML Solostar Pen Inject 16 Units into the skin at bedtime. 09/22/14  Yes Rowe Clack, MD  Liraglutide (VICTOZA) 18 MG/3ML SOPN Inject 0.6 mg into the skin daily.   Yes Historical Provider, MD  Multiple Vitamin (MULTIVITAMIN) capsule Take 1 capsule by mouth daily.   Yes Historical Provider, MD  omega-3 fish oil (MAXEPA) 1000 MG CAPS capsule Take 1 capsule by mouth daily.   Yes Historical Provider, MD  Oxycodone HCl 10 MG TABS Take 1 tablet (10 mg total) by mouth 3 (three) times daily as needed (severe pain). 09/28/14  Yes Rowe Clack, MD  potassium chloride SA (K-DUR,KLOR-CON) 20 MEQ tablet Take 2 tablets (40 mEq total) by mouth 3 (three) times daily. 10/12/14  Yes Rowe Clack, MD  PROAIR HFA 108 (90 BASE) MCG/ACT inhaler INHALE 2 PUFFS BY MOUTH EVERY 6 HOURS AS NEEDED 08/28/14  Yes Rowe Clack, MD  SPIRIVA HANDIHALER 18 MCG inhalation capsule INHALE CONTENTS OF 1 CAPSULE USING HANDIHALER EVERY DAY 08/24/14  Yes Kathee Delton, MD  spironolactone (ALDACTONE) 25 MG tablet Take 25 mg by mouth 2 (two) times daily.   Yes Historical Provider, MD  tiZANidine (ZANAFLEX) 4 MG tablet Take 1 tablet (4 mg total) by mouth every 8 (eight) hours as needed for muscle spasms. 07/30/14  Yes Biagio Borg, MD  torsemide (DEMADEX) 100 MG tablet Take 1 tablet (100 mg total) by mouth 2 (two) times daily. 07/30/14  Yes Biagio Borg, MD  fluconazole (DIFLUCAN) 150 MG tablet Take 1 tablet (150 mg total) by mouth once. May repeat dose next day if needed Patient not taking: Reported on 10/18/2014 10/07/14   Rowe Clack, MD  HYDROcodone-homatropine [facility]) 5-1.5 MG/5ML  syrup Take 5 mLs by mouth every 6 (six) hours as needed for cough. Patient not taking: Reported on 10/18/2014 07/30/14   Biagio Borg, MD  HYDROcodone-homatropine (HYDROMET) 5-1.5 MG/5ML syrup Take 5 mLs by mouth every 6 (six) hours as needed. Patient not taking: Reported on 10/18/2014 09/28/14   Tammy S Parrett, NP  Insulin Pen Needle 31G X 8 MM MISC Use to administer insulin (Solostar Pen). 09/28/14   Rowe Clack, MD  ketorolac (TORADOL) 10 MG tablet Take 1 tablet (10 mg total) by mouth every 6 (six) hours as needed. Patient not taking: Reported on 10/18/2014 08/17/14   Billy Fischer, MD  predniSONE (DELTASONE) 10 MG tablet Take 4 tabs daily x 2 days, 3 tabs daily x 2 days, 2 tabs daily x 2 days, 1 tab daily x 2 days then stop Patient not taking: Reported on 10/18/2014 08/10/14   Kathee Delton, MD  predniSONE (DELTASONE) 10 MG tablet 4 tabs for 2 days, then 3 tabs for 2 days, 2 tabs for 2 days, then 1 tab for 2 days, then stop Patient not taking: Reported on 10/18/2014 09/28/14   Tammy S Parrett, NP  sertraline (ZOLOFT) 100 MG tablet Take 100 mg by mouth daily.     Historical Provider, MD   BP 129/57 mmHg  Pulse 89  Temp(Src) 98 F (36.7 C) (Oral)  Resp 16  SpO2 100%  LMP 10/16/2012 Physical Exam  Constitutional: She is oriented to person, place, and time. She appears well-developed and well-nourished. No distress.  HENT:  Head: Normocephalic and atraumatic.  Mouth/Throat: No oropharyngeal exudate.  Eyes: Pupils are equal, round, and reactive to light.  Neck: Normal range of motion. Neck supple.  Cardiovascular: Normal rate, regular rhythm and normal heart sounds.  Exam reveals no gallop and no friction rub.   No murmur heard. Pulmonary/Chest: Effort normal. No respiratory distress. She has decreased breath sounds in the right upper field, the right middle field, the right lower field, the left upper field, the left middle field and the left lower field. She has wheezes in the right lower  field and the left lower field. She has no rales.  Abdominal: Soft. Bowel sounds are normal. She  exhibits no distension and no mass. There is no tenderness. There is no rebound and no guarding.  Musculoskeletal: Normal range of motion. She exhibits edema (1+ BLLE). She exhibits no tenderness.  Neurological: She is alert and oriented to person, place, and time.  Skin: Skin is warm and dry.  Psychiatric: She has a normal mood and affect.    ED Course  Procedures (including critical care time) Labs Review Labs Reviewed  CBC - Abnormal; Notable for the following:    RBC 5.46 (*)    MCV 76.4 (*)    MCH 24.5 (*)    RDW 17.7 (*)    All other components within normal limits  BASIC METABOLIC PANEL - Abnormal; Notable for the following:    Glucose, Bld 220 (*)    GFR calc non Af Amer 61 (*)    GFR calc Af Amer 70 (*)    All other components within normal limits  BRAIN NATRIURETIC PEPTIDE  I-STAT TROPOININ, ED    Imaging Review Dg Chest 2 View  10/18/2014   CLINICAL DATA:  Shortness of breath, chest pain and history of COPD and sarcoidosis.  EXAM: CHEST - 2 VIEW  COMPARISON:  07/22/2014  FINDINGS: Stable significant underlying chronic interstitial lung disease with multiple areas of stable bilateral parenchymal scarring. There is no evidence of pulmonary edema, consolidation, pneumothorax, nodule or pleural fluid. The heart size is stable and normal. Stable prominence of central pulmonary arteries. The bony thorax is unremarkable.  IMPRESSION: Stable chronic lung disease.   Electronically Signed   By: Aletta Edouard M.D.   On: 10/18/2014 14:23     EKG Interpretation   Date/Time:  Sunday October 18 2014 15:12:01 EST Ventricular Rate:  86 PR Interval:  151 QRS Duration: 87 QT Interval:  378 QTC Calculation: 452 R Axis:   34 Text Interpretation:  Age not entered, assumed to be  49 years old for  purpose of ECG interpretation Ectopic atrial rhythm ST elev, probable  normal early repol  pattern Confirmed by DOCHERTY  MD, Humphrey [phone]) on  10/18/2014 3:34:12 PM      CRITICAL CARE Performed by: Ernestina Patches, E Total critical care time: 40 Critical care time was exclusive of separately billable procedures and treating other patients. Critical care was necessary to treat or prevent imminent or life-threatening deterioration. Critical care was time spent personally by me on the following activities: development of treatment plan with patient and/or surrogate as well as nursing, discussions with consultants, evaluation of patient's response to treatment, examination of patient, obtaining history from patient or surrogate, ordering and performing treatments and interventions, ordering and review of laboratory studies, ordering and review of radiographic studies, pulse oximetry and re-evaluation of patient's condition.  MDM   Final diagnoses:  COPD exacerbation  Hypoxia    Pt is a 49 y.o. female with Pmhx as above who presents with 2-3 months of SOB, not relieved by course of cough syrup, amox and pred given by her pulm clinic on jan 4th.  For the last to 3 days she has noted increased cough, shortness of breath versus worse with exertion and lying flat.  She is also noticed some swelling in her hands and feet.  Today she has had 2-3 episodes of a sharp midsternal chest pain without radiation lasting 2 or 3 seconds at a time without exacerbating or alleviating symptoms.  She states that she walked to the bathroom and checked her pulse ox and found it to be 80% earlier today.  On PE, She has globally dec air mvmt w/ wheezing at bases, minimal pitting edema.   Troponin and BNP are not elevated.  Chest x-ray with chronic lung disease.  She has minimal increase in air movement after 0.5 Atrovent and 10 mg continuous albuterol neb, and 60 mg by mouth prednisone.  She was ambulated and dropped into the mid 80s on her home 3 L. Burnis Medin start second albuterol neb and admit to [facility] for COPD  exacerbation.     Ernestina Patches, MD 10/18/14 2000

## 2014-10-18 NOTE — H&P (Signed)
History and Physical  [name]:[id number] DOB: [birth year] DOA: 10/18/2014  Referring physician: ED PCP: Yvette Grant, MD   Chief Complaint: sob  HPI: [name] is a 49 y.o. female   Patient reports chronic h/o shortness of breath on exertion and with lying flat with productive cough. Symptoms have been worse for past 2-3 days. She was on a course of cough syrup, prednisone and amoxicillin about 3 weeks ago which did not improve symptoms. Today she had 2-3 episodes of a sharp midsternal chest pain lasting seconds. She's no chest pain currently. She has had chills but no fever. She has had mild edema in her hands and her feet which she has noticed today.She has been compliant with her home O2 and BiPAP. Also reported yeast infection, requested diflucan. She denies fever, cough in nonproductive, no running nose, no sore throat, no palpitation, no n/v, no diarrhea.   Review of Systems:   Per HPI,  Review of systems are otherwise negative  Past Medical History  Diagnosis Date  . Hypertension   . Hyperlipidemia   . Chronic headache   . Fibromyalgia     daily narcotics  . Anxiety     hx chronic BZ use, stopped 07/2010  . Anemia   . Pulmonary sarcoidosis     unimpressive CT chest 2011  . Colonic polyp   . GERD (gastroesophageal reflux disease)   . ALLERGIC RHINITIS   . Asthma   . CHF (congestive heart failure)     Diastolic with fluid overload, May, 2012, LVEF 60%  . Morbid obesity   . Depression   . Panic attacks   . Diabetes mellitus   . COPD (chronic obstructive pulmonary disease)     on home O2, moderate airflow obstruction, suspect d/t emphysema  . Obesity   . Elevated LFTs 09/2011  . Ovarian cyst   . Chronic back pain   . Sleep apnea      CPAP  . Fatty liver   . Esophagitis   . Gastritis   . Internal hemorrhoids   . Adenomatous colon polyp 12/03/09    [facility] in [place], [place]   Past Surgical History  Procedure Laterality Date    . Polypectomy  2011  . Lumbar microdiscectomy  07/06/2011    R L4-5, stern  . Back surgery    . Carpal tunnel release    . Steroid spinal injections    . Hysteroscopy w/d&c N/A 03/25/2013    Procedure: DILATATION AND CURETTAGE /HYSTEROSCOPY;  Surgeon: [name], MD;  Location: [facility];  Service: Gynecology;  Laterality: N/A;  . Dilation and curettage of uterus     Social History:  reports that she quit smoking about 2 months ago. Her smoking use included Cigarettes. She has a 7.25 pack-year smoking history. She has never used smokeless tobacco. She reports that she drinks alcohol. She reports that she uses illicit drugs (Cocaine and Marijuana). Patient lives at home alone, is able to participate in activities of daily living.  Allergies  Allergen Reactions  . Simvastatin Other (See Comments)    Severe leg pain and burning  . Sulfamethoxazole-Trimethoprim Nausea And Vomiting    Causes Projectile Vomiting  . Zolpidem Tartrate Other (See Comments)    Chest pain  . Azithromycin Other (See Comments)    Resistant to med  . Doxycycline Other (See Comments)    Resistant to med  . Latex Itching and Rash    Also burning sensations  . Metformin And  Related Diarrhea    Severe diarrhea  . Metronidazole Nausea And Vomiting  . Metolazone Other (See Comments)    Cramps,pain  . Ciprofloxacin Nausea Only    Family History  Problem Relation Age of Onset  . Hypertension Mother   . Emphysema Father   . Hypertension Father   . Stomach cancer Father   . Allergies Brother   . Hypertension Brother   . Stomach cancer Brother   . Heart disease Mother   . Heart disease Father   . Heart disease Brother     died age 54 sudden death/MI      Prior to Admission medications   Medication Sig Start Date End Date Taking? Authorizing Provider  albuterol (PROVENTIL) (2.5 MG/3ML) 0.083% nebulizer solution INHALE THE CONTENTS OF 1 VIAL VIA NEBULIZER EVERY 6 HOURS AS NEEDED FOR WHEEZING OR SHORTNESS  OF BREATH 10/13/14  Yes Rowe Clack, MD  ALPRAZolam Duanne Moron) 1 MG tablet Take 1 mg by mouth 3 (three) times daily.   Yes Historical Provider, MD  aspirin EC 81 MG tablet Take 81 mg by mouth daily.   Yes Historical Provider, MD  BIOTIN PO Take 1 capsule by mouth daily.   Yes Historical Provider, MD  budesonide-formoterol (SYMBICORT) 160-4.5 MCG/ACT inhaler Inhale 2 puffs into the lungs 2 (two) times daily.   Yes Historical Provider, MD  cetirizine (ZYRTEC) 1 MG/ML syrup TAKE 10 ML BY MOUTH EVERY NIGHT AT BEDTIME 09/07/14  Yes Rowe Clack, MD  Coenzyme Q10 (CO Q 10) 10 MG CAPS Take 1 capsule by mouth daily.   Yes Historical Provider, MD  esomeprazole (NEXIUM) 40 MG capsule Take 40 mg by mouth 2 (two) times daily before a meal.   Yes Historical Provider, MD  fluticasone (FLONASE) 50 MCG/ACT nasal spray Place 2 sprays into both nostrils daily as needed for allergies or rhinitis.   Yes Historical Provider, MD  gabapentin (NEURONTIN) 300 MG capsule Take 300 mg by mouth at bedtime.   Yes Historical Provider, MD  glimepiride (AMARYL) 1 MG tablet Take 1 mg by mouth daily with breakfast.   Yes Historical Provider, MD  hydrOXYzine (ATARAX/VISTARIL) 10 MG tablet TAKE 1 TABLET BY MOUTH THREE TIMES DAILY AS NEEDED FOR ITCHING 08/27/14  Yes Biagio Borg, MD  insulin aspart (NOVOLOG FLEXPEN) 100 UNIT/ML FlexPen Use as directed per sliding scale indicated by your physician. 09/22/14  Yes Rowe Clack, MD  Insulin Glargine (LANTUS SOLOSTAR) 100 UNIT/ML Solostar Pen Inject 16 Units into the skin at bedtime. 09/22/14  Yes Rowe Clack, MD  Liraglutide (VICTOZA) 18 MG/3ML SOPN Inject 0.6 mg into the skin daily.   Yes Historical Provider, MD  Multiple Vitamin (MULTIVITAMIN) capsule Take 1 capsule by mouth daily.   Yes Historical Provider, MD  omega-3 fish oil (MAXEPA) 1000 MG CAPS capsule Take 1 capsule by mouth daily.   Yes Historical Provider, MD  Oxycodone HCl 10 MG TABS Take 1 tablet (10 mg  total) by mouth 3 (three) times daily as needed (severe pain). 09/28/14  Yes Rowe Clack, MD  potassium chloride SA (K-DUR,KLOR-CON) 20 MEQ tablet Take 2 tablets (40 mEq total) by mouth 3 (three) times daily. 10/12/14  Yes Rowe Clack, MD  PROAIR HFA 108 (90 BASE) MCG/ACT inhaler INHALE 2 PUFFS BY MOUTH EVERY 6 HOURS AS NEEDED 08/28/14  Yes Rowe Clack, MD  SPIRIVA HANDIHALER 18 MCG inhalation capsule INHALE CONTENTS OF 1 CAPSULE USING HANDIHALER EVERY DAY 08/24/14  Yes Kathee Delton,  MD  spironolactone (ALDACTONE) 25 MG tablet Take 25 mg by mouth 2 (two) times daily.   Yes Historical Provider, MD  tiZANidine (ZANAFLEX) 4 MG tablet Take 1 tablet (4 mg total) by mouth every 8 (eight) hours as needed for muscle spasms. 07/30/14  Yes Biagio Borg, MD  torsemide (DEMADEX) 100 MG tablet Take 1 tablet (100 mg total) by mouth 2 (two) times daily. 07/30/14  Yes Biagio Borg, MD  fluconazole (DIFLUCAN) 150 MG tablet Take 1 tablet (150 mg total) by mouth once. May repeat dose next day if needed Patient not taking: Reported on 10/18/2014 10/07/14   Rowe Clack, MD  HYDROcodone-homatropine [facility]) 5-1.5 MG/5ML syrup Take 5 mLs by mouth every 6 (six) hours as needed for cough. Patient not taking: Reported on 10/18/2014 07/30/14   Biagio Borg, MD  HYDROcodone-homatropine (HYDROMET) 5-1.5 MG/5ML syrup Take 5 mLs by mouth every 6 (six) hours as needed. Patient not taking: Reported on 10/18/2014 09/28/14   Tammy S Parrett, NP  Insulin Pen Needle 31G X 8 MM MISC Use to administer insulin (Solostar Pen). 09/28/14   Rowe Clack, MD  ketorolac (TORADOL) 10 MG tablet Take 1 tablet (10 mg total) by mouth every 6 (six) hours as needed. Patient not taking: Reported on 10/18/2014 08/17/14   Billy Fischer, MD  predniSONE (DELTASONE) 10 MG tablet Take 4 tabs daily x 2 days, 3 tabs daily x 2 days, 2 tabs daily x 2 days, 1 tab daily x 2 days then stop Patient not taking: Reported on 10/18/2014 08/10/14    Kathee Delton, MD  predniSONE (DELTASONE) 10 MG tablet 4 tabs for 2 days, then 3 tabs for 2 days, 2 tabs for 2 days, then 1 tab for 2 days, then stop Patient not taking: Reported on 10/18/2014 09/28/14   Tammy S Parrett, NP  sertraline (ZOLOFT) 100 MG tablet Take 100 mg by mouth daily.     Historical Provider, MD    Physical Exam: BP 129/57 mmHg  Pulse 89  Temp(Src) 98 F (36.7 C) (Oral)  Resp 16  SpO2 100%  LMP 10/16/2012  General:  Obese, mild distress and anxious lady, able to talk in full sentences. Eyes: PERRL ENT: unremarkable Neck: suppler Cardiovascular: sinus tachycardia Respiratory: diffuse wheezing, poor air entry Abdomen: soft/NT/ND, positive bowel sounds Skin: chronic dark pigmentation bilateral lower extremity Musculoskeletal: ? Mild edema? Psychiatric: anxious Neurologic: aaox3, no focal deficit          Labs on Admission:  Basic Metabolic Panel:  Recent Labs Lab 10/18/14 1355  NA 136  K 3.7  CL 96  CO2 32  GLUCOSE 220*  BUN 15  CREATININE 1.07  CALCIUM 9.9   Liver Function Tests: No results for input(s): AST, ALT, ALKPHOS, BILITOT, PROT, ALBUMIN in the last 168 hours. No results for input(s): LIPASE, AMYLASE in the last 168 hours. No results for input(s): AMMONIA in the last 168 hours. CBC:  Recent Labs Lab 10/18/14 1355  WBC 5.8  HGB 13.4  HCT 41.7  MCV 76.4*  PLT 180   Cardiac Enzymes: No results for input(s): CKTOTAL, CKMB, CKMBINDEX, TROPONINI in the last 168 hours.  BNP (last 3 results)  Recent Labs  02/11/14 0021 04/17/14 0120 07/20/14 1817  PROBNP 32.8 44.7 39.7   CBG: No results for input(s): GLUCAP in the last 168 hours.  Radiological Exams on Admission: Dg Chest 2 View  10/18/2014   CLINICAL DATA:  Shortness of breath, chest pain and history  of COPD and sarcoidosis.  EXAM: CHEST - 2 VIEW  COMPARISON:  07/22/2014  FINDINGS: Stable significant underlying chronic interstitial lung disease with multiple areas of stable  bilateral parenchymal scarring. There is no evidence of pulmonary edema, consolidation, pneumothorax, nodule or pleural fluid. The heart size is stable and normal. Stable prominence of central pulmonary arteries. The bony thorax is unremarkable.  IMPRESSION: Stable chronic lung disease.   Electronically Signed   By: Aletta Edouard M.D.   On: 10/18/2014 14:23    EKG: Independently reviewed. Inverted pwaves, seems to be chronic, regular, no acute st/t changes  Assessment/Plan Present on Admission:  . COPD exacerbation  Copd exacerbation, start abx/nebs/steroids/mucolytics/o2, will continue bipap qhs.patient will be admitted to stepdown unit due to bipap use.  Diastolic chf, does not seem in exacerbation, although patient reported swelling, on physical exam, no significant edema, continue home dose diuretics. Cycle cardiac enzymes. On tele.  Insulin dependent DM2, continue insulin, will need higher dose of insulin due to steroids use.  Consultants: none  Code Status: full  Family Communication: patient  Disposition Plan: admit to stepdown unit  Time spent: 52mins  Silver Parkey [facility] Pager [phone]

## 2014-10-18 NOTE — ED Notes (Addendum)
Admitting MD at bedside.

## 2014-10-18 NOTE — ED Notes (Signed)
Pt sats at 85% when standing on 4L. MD notified.

## 2014-10-18 NOTE — ED Notes (Addendum)
Pt has hx of chf and COPD. Pt states her O2 has been lower than usual, in the 80's the last few days. Swelling noted in hands and calfs/feet. Pt states this started today. Pt normally wears 3L at home, pt is on 4 L today. Intermittent chest pain began today central chest, non radiating. Pt states she has been doing breathing treatments at home without any relief.

## 2014-10-18 NOTE — ED Notes (Signed)
Respiratory contacted regarding 10 mg albuterol neb. [name] will be here shortly to do that.

## 2014-10-18 NOTE — ED Notes (Signed)
Attempted report 

## 2014-10-18 NOTE — ED Notes (Signed)
Pt reports having cough and sob. Hx of copd and chf, having swelling to hands and face. Pt wears home o2, reports decrease in spo2 with exertion. Reports sob is worse with exertion and when lying down, "feels like she is drowning."

## 2014-10-19 ENCOUNTER — Telehealth: Payer: Self-pay

## 2014-10-19 DIAGNOSIS — E119 Type 2 diabetes mellitus without complications: Secondary | ICD-10-CM

## 2014-10-19 DIAGNOSIS — Z794 Long term (current) use of insulin: Secondary | ICD-10-CM

## 2014-10-19 LAB — HEMOGLOBIN A1C
Hgb A1c MFr Bld: 8 % — ABNORMAL HIGH (ref <5.7)
Mean Plasma Glucose: 183 mg/dL — ABNORMAL HIGH (ref <117)

## 2014-10-19 LAB — COMPREHENSIVE METABOLIC PANEL
ALT: 35 U/L (ref 0–35)
AST: 34 U/L (ref 0–37)
Albumin: 3.6 g/dL (ref 3.5–5.2)
Alkaline Phosphatase: 93 U/L (ref 39–117)
Anion gap: 14 (ref 5–15)
BILIRUBIN TOTAL: 0.8 mg/dL (ref 0.3–1.2)
BUN: 16 mg/dL (ref 6–23)
CHLORIDE: 92 mmol/L — AB (ref 96–112)
CO2: 27 mmol/L (ref 19–32)
CREATININE: 1.13 mg/dL — AB (ref 0.50–1.10)
Calcium: 9.5 mg/dL (ref 8.4–10.5)
GFR calc non Af Amer: 57 mL/min — ABNORMAL LOW (ref >90)
GFR, EST AFRICAN AMERICAN: 66 mL/min — AB (ref >90)
Glucose, Bld: 350 mg/dL — ABNORMAL HIGH (ref 70–99)
Potassium: 4.5 mmol/L (ref 3.5–5.1)
SODIUM: 133 mmol/L — AB (ref 135–145)
Total Protein: 8.1 g/dL (ref 6.0–8.3)

## 2014-10-19 LAB — GLUCOSE, CAPILLARY
GLUCOSE-CAPILLARY: 323 mg/dL — AB (ref 70–99)
Glucose-Capillary: 256 mg/dL — ABNORMAL HIGH (ref 70–99)

## 2014-10-19 LAB — CBC
HEMATOCRIT: 43.1 % (ref 36.0–46.0)
Hemoglobin: 14.1 g/dL (ref 12.0–15.0)
MCH: 25.1 pg — ABNORMAL LOW (ref 26.0–34.0)
MCHC: 32.7 g/dL (ref 30.0–36.0)
MCV: 76.7 fL — ABNORMAL LOW (ref 78.0–100.0)
Platelets: 189 10*3/uL (ref 150–400)
RBC: 5.62 MIL/uL — AB (ref 3.87–5.11)
RDW: 17.8 % — ABNORMAL HIGH (ref 11.5–15.5)
WBC: 10.3 10*3/uL (ref 4.0–10.5)

## 2014-10-19 LAB — MRSA PCR SCREENING: MRSA BY PCR: NEGATIVE

## 2014-10-19 LAB — TROPONIN I: Troponin I: <0.03 ng/mL (ref <0.031)

## 2014-10-19 MED ORDER — MORPHINE SULFATE 2 MG/ML IJ SOLN
1.0000 mg | INTRAMUSCULAR | Status: DC | PRN
  Administered 2014-10-19 – 2014-10-20 (×4): 1 mg via INTRAVENOUS
  Filled 2014-10-19 (×4): qty 1

## 2014-10-19 MED ORDER — CETYLPYRIDINIUM CHLORIDE 0.05 % MT LIQD
7.0000 mL | Freq: Two times a day (BID) | OROMUCOSAL | Status: DC
  Administered 2014-10-19 – 2014-10-24 (×8): 7 mL via OROMUCOSAL

## 2014-10-19 MED ORDER — RISAQUAD PO CAPS
1.0000 | ORAL_CAPSULE | Freq: Every day | ORAL | Status: DC
  Administered 2014-10-20 – 2014-10-24 (×5): 1 via ORAL
  Filled 2014-10-19 (×5): qty 1

## 2014-10-19 MED ORDER — AMOXICILLIN-POT CLAVULANATE 875-125 MG PO TABS
1.0000 | ORAL_TABLET | Freq: Two times a day (BID) | ORAL | Status: DC
  Administered 2014-10-19 – 2014-10-24 (×10): 1 via ORAL
  Filled 2014-10-19 (×11): qty 1

## 2014-10-19 MED ORDER — BENZONATATE 100 MG PO CAPS
100.0000 mg | ORAL_CAPSULE | Freq: Two times a day (BID) | ORAL | Status: DC | PRN
  Administered 2014-10-19 – 2014-10-22 (×4): 100 mg via ORAL
  Filled 2014-10-19 (×4): qty 1

## 2014-10-19 MED ORDER — SENNOSIDES-DOCUSATE SODIUM 8.6-50 MG PO TABS
1.0000 | ORAL_TABLET | Freq: Two times a day (BID) | ORAL | Status: DC
  Administered 2014-10-19 – 2014-10-24 (×10): 1 via ORAL
  Filled 2014-10-19 (×11): qty 1

## 2014-10-19 MED ORDER — FUROSEMIDE 10 MG/ML IJ SOLN
40.0000 mg | Freq: Once | INTRAMUSCULAR | Status: AC
  Administered 2014-10-19: 40 mg via INTRAVENOUS

## 2014-10-19 MED ORDER — OXYCODONE HCL 5 MG PO TABS
10.0000 mg | ORAL_TABLET | Freq: Three times a day (TID) | ORAL | Status: DC
  Administered 2014-10-19 – 2014-10-24 (×15): 10 mg via ORAL
  Filled 2014-10-19 (×15): qty 2

## 2014-10-19 MED ORDER — OXYCODONE HCL 5 MG PO TABS: 10.0000 mg | ORAL_TABLET | Freq: Three times a day (TID) | ORAL | Status: DC

## 2014-10-19 MED ORDER — NICOTINE 14 MG/24HR TD PT24
14.0000 mg | MEDICATED_PATCH | Freq: Every day | TRANSDERMAL | Status: DC
  Administered 2014-10-19 – 2014-10-24 (×6): 14 mg via TRANSDERMAL
  Filled 2014-10-19 (×6): qty 1

## 2014-10-19 NOTE — Progress Notes (Signed)
Inpatient Diabetes Program Recommendations  AACE/ADA: New Consensus Statement on Inpatient Glycemic Control (2013)  Target Ranges:  Prepandial:   less than 140 mg/dL      Peak postprandial:   less than 180 mg/dL (1-2 hours)      Critically ill patients:  140 - 180 mg/dL   Glucose levels high in 300's. Please consider the following: Inpatient Diabetes Program Recommendations Insulin - Basal: Pt takes lantus 17 units at home and definitely needs here while on high dose steroid therapy. Please add 20 units lantus Correction (SSI): Please increase correction to resistant tidwc and HS Insulin - Meal Coverage: Please add at least 4 units novolog meal coverage tidwc whiile on solumedrol and any dose prednisone greater than 10 mg/day  Thank you, Rosita Kea, RN, CNS, Diabetes Coordinator   Pager [phone]) 8:00 am to 10:00pm Office [phone])  8:59m - 5:00 pm

## 2014-10-19 NOTE — Telephone Encounter (Signed)
Concerns noted Thanks for the information

## 2014-10-19 NOTE — Progress Notes (Signed)
PROGRESS NOTE  [name] OIZ:[id number] DOB: [birth year] DOA: 10/18/2014 PCP: Gwendolyn Grant, MD  HPI/Recap of past 24 hours: C/o persistent cough, tolerated bipap per patient  Assessment/Plan: Active Problems:   COPD exacerbation   Copd exacerbation, start abx/nebs/steroids/mucolytics/o2, will continue bipap qhs.will need continue to be in stepdown unit due to bipap use.  Diastolic chf, does not seem in exacerbation, although patient reported swelling, on physical exam, no significant edema, continue home dose diuretics. Cycle cardiac enzymes. On tele. Extra lasix 40mg  iv x1 on 1/25.  Insulin dependent DM2, continue insulin, will need higher dose of insulin due to steroids use.  Chronic pain, continue home pain meds, patient also requested prn pain meds. Adequate stool regimen.  Morbid obesity: life style modification   Code Status: full  Family Communication: patient  Disposition Plan: remain in patient   Consultants:  none  Procedures:  none  Antibiotics:  Augmentin (patient has multiple abx allergy)   Objective: BP 116/47 mmHg  Pulse 92  Temp(Src) 97.7 F (36.5 C) (Oral)  Resp 34  Ht 5\' 8"  (1.727 m)  SpO2 95%  LMP 10/16/2012  Intake/Output Summary (Last 24 hours) at 10/19/14 1722 Last data filed at 10/19/14 1400  Gross per 24 hour  Intake    840 ml  Output    750 ml  Net     90 ml   There were no vitals filed for this visit.  Exam:   General:  obese  Cardiovascular: RRR  Respiratory: diminished, poor air entry, seems has less wheezes.  Abdomen: obese, NT/positive bowel sounds  Musculoskeletal: no obvious edema  Data Reviewed: Basic Metabolic Panel:  Recent Labs Lab 10/18/14 1355 10/19/14 1025  NA 136 133*  K 3.7 4.5  CL 96 92*  CO2 32 27  GLUCOSE 220* 350*  BUN 15 16  CREATININE 1.07 1.13*  CALCIUM 9.9 9.5   Liver Function Tests:  Recent Labs Lab 10/19/14 1025  AST 34  ALT 35  ALKPHOS 93  BILITOT 0.8    PROT 8.1  ALBUMIN 3.6   No results for input(s): LIPASE, AMYLASE in the last 168 hours. No results for input(s): AMMONIA in the last 168 hours. CBC:  Recent Labs Lab 10/18/14 1355 10/19/14 1025  WBC 5.8 10.3  HGB 13.4 14.1  HCT 41.7 43.1  MCV 76.4* 76.7*  PLT 180 189   Cardiac Enzymes:    Recent Labs Lab 10/18/14 2106 10/19/14 0240 10/19/14 1025  TROPONINI <0.03 <0.03 <0.03   BNP (last 3 results)  Recent Labs  02/11/14 0021 04/17/14 0120 07/20/14 1817  PROBNP 32.8 44.7 39.7   CBG:  Recent Labs Lab 10/18/14 2129 10/19/14 0735 10/19/14 1212  GLUCAP 283* 256* 323*    Recent Results (from the past 240 hour(s))  MRSA PCR Screening     Status: None   Collection Time: 10/18/14 11:08 PM  Result Value Ref Range Status   MRSA by PCR NEGATIVE NEGATIVE Final    Comment:        The GeneXpert MRSA Assay (FDA approved for NASAL specimens only), is one component of a comprehensive MRSA colonization surveillance program. It is not intended to diagnose MRSA infection nor to guide or monitor treatment for MRSA infections.      Studies: Dg Chest 2 View  10/18/2014   CLINICAL DATA:  Shortness of breath, chest pain and history of COPD and sarcoidosis.  EXAM: CHEST - 2 VIEW  COMPARISON:  07/22/2014  FINDINGS: Stable significant underlying chronic interstitial  lung disease with multiple areas of stable bilateral parenchymal scarring. There is no evidence of pulmonary edema, consolidation, pneumothorax, nodule or pleural fluid. The heart size is stable and normal. Stable prominence of central pulmonary arteries. The bony thorax is unremarkable.  IMPRESSION: Stable chronic lung disease.   Electronically Signed   By: Aletta Edouard M.D.   On: 10/18/2014 14:23    Scheduled Meds: . [START ON 10/20/2014] acidophilus  1 capsule Oral Daily  . ALPRAZolam  1 mg Oral TID  . amoxicillin-clavulanate  1 tablet Oral Q12H  . antiseptic oral rinse  7 mL Mouth Rinse BID  . aspirin EC   81 mg Oral Daily  . dextromethorphan-guaiFENesin  1 tablet Oral BID  . enoxaparin (LOVENOX) injection  40 mg Subcutaneous Q24H  . furosemide  40 mg Intravenous Once  . insulin aspart  0-15 Units Subcutaneous TID WC  . insulin aspart  0-5 Units Subcutaneous QHS  . ipratropium-albuterol  3 mL Nebulization Q4H  . methylPREDNISolone (SOLU-MEDROL) injection  60 mg Intravenous Q6H  . nicotine  14 mg Transdermal Daily  . oxyCODONE  10 mg Oral TID  . pantoprazole  40 mg Oral Daily  . senna-docusate  1 tablet Oral BID  . sertraline  100 mg Oral Daily  . spironolactone  25 mg Oral BID  . torsemide  100 mg Oral BID    Continuous Infusions: . albuterol 10 mg/hr (10/18/14 1822)       Sharese Manrique  [facility] Pager [phone]. If 7PM-7AM, please contact night-coverage at [email or web address], password [facility] 10/19/2014, 5:22 PM  LOS: 1 day
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
## 2014-10-19 NOTE — Care Management Note (Addendum)
    Page 1 of 1   10/21/2014     10:14:17 AM CARE MANAGEMENT NOTE 10/21/2014  Patient:  [name], [name]   Account Number:  [id number]  Date Initiated:  10/19/2014  Documentation initiated by:  [name]  Subjective/Objective Assessment:   adm w copd exacerb     Action/Plan:   lives alone, pcp dr Asa Lente   Anticipated DC Date:     Anticipated DC Plan:  [place]         Choice offered to / List presented to:             Status of service:   Medicare Important Message given?  YES (If response is "NO", the following Medicare IM given date fields will be blank) Date Medicare IM given:  10/21/2014 Medicare IM given by:  [name] Date Additional Medicare IM given:   Additional Medicare IM given by:    Discharge Disposition:    Per UR Regulation:    If discussed at Long Length of Stay Meetings, dates discussed:    Comments:  1/25 1023 debbie Tiasia Weberg rn,bsn chart states get aid and hhrn, has o2 w [facility].

## 2014-10-20 LAB — GLUCOSE, CAPILLARY
GLUCOSE-CAPILLARY: 318 mg/dL — AB (ref 70–99)
GLUCOSE-CAPILLARY: 370 mg/dL — AB (ref 70–99)
Glucose-Capillary: 361 mg/dL — ABNORMAL HIGH (ref 70–99)
Glucose-Capillary: 336 mg/dL — ABNORMAL HIGH (ref 70–99)
Glucose-Capillary: 393 mg/dL — ABNORMAL HIGH (ref 70–99)
Glucose-Capillary: 369 mg/dL — ABNORMAL HIGH (ref 70–99)

## 2014-10-20 MED ORDER — IPRATROPIUM-ALBUTEROL 0.5-2.5 (3) MG/3ML IN SOLN
3.0000 mL | Freq: Four times a day (QID) | RESPIRATORY_TRACT | Status: DC
  Administered 2014-10-20 – 2014-10-23 (×10): 3 mL via RESPIRATORY_TRACT
  Filled 2014-10-20 (×13): qty 3

## 2014-10-20 MED ORDER — WHITE PETROLATUM GEL
Status: AC
  Administered 2014-10-20: 13:00:00
  Filled 2014-10-20: qty 1

## 2014-10-20 MED ORDER — INSULIN GLARGINE 100 UNIT/ML ~~LOC~~ SOLN
16.0000 [IU] | Freq: Every day | SUBCUTANEOUS | Status: DC
  Filled 2014-10-20: qty 0.16

## 2014-10-20 MED ORDER — BUDESONIDE-FORMOTEROL FUMARATE 160-4.5 MCG/ACT IN AERO
2.0000 | INHALATION_SPRAY | Freq: Two times a day (BID) | RESPIRATORY_TRACT | Status: DC
  Administered 2014-10-20 – 2014-10-23 (×3): 2 via RESPIRATORY_TRACT
  Filled 2014-10-20 (×4): qty 6

## 2014-10-20 MED ORDER — MORPHINE SULFATE 2 MG/ML IJ SOLN
2.0000 mg | INTRAMUSCULAR | Status: DC | PRN
  Administered 2014-10-20 – 2014-10-24 (×13): 2 mg via INTRAVENOUS
  Filled 2014-10-20 (×15): qty 1

## 2014-10-20 MED ORDER — PHENOL 1.4 % MT LIQD
1.0000 | OROMUCOSAL | Status: DC | PRN
  Filled 2014-10-20: qty 177

## 2014-10-20 MED ORDER — INSULIN GLARGINE 100 UNIT/ML ~~LOC~~ SOLN
20.0000 [IU] | Freq: Every day | SUBCUTANEOUS | Status: DC
  Administered 2014-10-20: 20 [IU] via SUBCUTANEOUS
  Filled 2014-10-20 (×2): qty 0.2

## 2014-10-20 MED ORDER — FLUTICASONE PROPIONATE 50 MCG/ACT NA SUSP
2.0000 | Freq: Every day | NASAL | Status: DC
  Administered 2014-10-21 – 2014-10-24 (×3): 2 via NASAL
  Filled 2014-10-20: qty 16

## 2014-10-20 NOTE — Progress Notes (Signed)
PROGRESS NOTE  [name] PJA:[id number] DOB: 1966/02/20 DOA: 10/18/2014 PCP: Gwendolyn Grant, MD  HPI/Recap of past 24 hours: Feeling better, less cough. Patient requests to have hiv test, orders placed per patient's request.  Assessment/Plan: Active Problems:   COPD exacerbation   Copd exacerbation, start abx/nebs/steroids/mucolytics/o2, will continue bipap qhs.will need continue to be in stepdown unit due to bipap use. Less wheezing today, continue current treatment, on oral abx due to concern of fluids overload.  Diastolic chf, does not seem in exacerbation, although patient reported swelling, on physical exam, no significant edema, continue home dose diuretics. Cycle cardiac enzymes. On tele. Extra lasix 40mg  iv x1 on 1/25 with good result. May need additional lasix.  Insulin dependent DM2, continue insulin, will need higher dose of insulin due to steroids use.  Chronic pain, continue home pain meds, patient also requested prn pain meds. Adequate stool regimen.  Morbid obesity: life style modification   Code Status: full  Family Communication: patient and sister  Disposition Plan: likely be able to d/c home in 1-2 days   Consultants:  none  Procedures:  none  Antibiotics:  Augmentin (patient has multiple abx allergy)   Objective: BP 115/40 mmHg  Pulse 82  Temp(Src) 97.6 F (36.4 C) (Oral)  Resp 22  Ht 5\' 8"  (1.727 m)  SpO2 98%  LMP 10/16/2012  Intake/Output Summary (Last 24 hours) at 10/20/14 1821 Last data filed at 10/20/14 1426  Gross per 24 hour  Intake    390 ml  Output   1800 ml  Net  -1410 ml   There were no vitals filed for this visit.  Exam:   General:  obese  Cardiovascular: RRR  Respiratory: somewhat improved air entry, but overall very diminished, seems has less wheezes.  Abdomen: obese, NT/positive bowel sounds  Musculoskeletal: no obvious edema  Data Reviewed: Basic Metabolic Panel:  Recent Labs Lab  10/18/14 1355 10/19/14 1025  NA 136 133*  K 3.7 4.5  CL 96 92*  CO2 32 27  GLUCOSE 220* 350*  BUN 15 16  CREATININE 1.07 1.13*  CALCIUM 9.9 9.5   Liver Function Tests:  Recent Labs Lab 10/19/14 1025  AST 34  ALT 35  ALKPHOS 93  BILITOT 0.8  PROT 8.1  ALBUMIN 3.6   No results for input(s): LIPASE, AMYLASE in the last 168 hours. No results for input(s): AMMONIA in the last 168 hours. CBC:  Recent Labs Lab 10/18/14 1355 10/19/14 1025  WBC 5.8 10.3  HGB 13.4 14.1  HCT 41.7 43.1  MCV 76.4* 76.7*  PLT 180 189   Cardiac Enzymes:    Recent Labs Lab 10/18/14 2106 10/19/14 0240 10/19/14 1025  TROPONINI <0.03 <0.03 <0.03   BNP (last 3 results)  Recent Labs  02/11/14 0021 04/17/14 0120 07/20/14 1817  PROBNP 32.8 44.7 39.7   CBG:  Recent Labs Lab 10/19/14 1645 10/19/14 2138 10/20/14 0757 10/20/14 1137 10/20/14 1603  GLUCAP 318* 361* 336* 393* 369*    Recent Results (from the past 240 hour(s))  MRSA PCR Screening     Status: None   Collection Time: 10/18/14 11:08 PM  Result Value Ref Range Status   MRSA by PCR NEGATIVE NEGATIVE Final    Comment:        The GeneXpert MRSA Assay (FDA approved for NASAL specimens only), is one component of a comprehensive MRSA colonization surveillance program. It is not intended to diagnose MRSA infection nor to guide or monitor treatment for MRSA infections.  Studies: No results found.  Scheduled Meds: . acidophilus  1 capsule Oral Daily  . ALPRAZolam  1 mg Oral TID  . amoxicillin-clavulanate  1 tablet Oral Q12H  . antiseptic oral rinse  7 mL Mouth Rinse BID  . aspirin EC  81 mg Oral Daily  . budesonide-formoterol  2 puff Inhalation BID  . dextromethorphan-guaiFENesin  1 tablet Oral BID  . enoxaparin (LOVENOX) injection  40 mg Subcutaneous Q24H  . [START ON 10/21/2014] fluticasone  2 spray Each Nare Daily  . insulin aspart  0-15 Units Subcutaneous TID WC  . insulin aspart  0-5 Units Subcutaneous  QHS  . insulin glargine  20 Units Subcutaneous QHS  . ipratropium-albuterol  3 mL Nebulization Q6H  . methylPREDNISolone (SOLU-MEDROL) injection  60 mg Intravenous Q6H  . nicotine  14 mg Transdermal Daily  . oxyCODONE  10 mg Oral TID  . pantoprazole  40 mg Oral Daily  . senna-docusate  1 tablet Oral BID  . sertraline  100 mg Oral Daily  . spironolactone  25 mg Oral BID  . torsemide  100 mg Oral BID    Continuous Infusions: . albuterol 10 mg/hr (10/18/14 1822)       [name]  [facility] Pager [phone]. If 7PM-7AM, please contact night-coverage at [email or web address], password [facility] 10/20/2014, 6:21 PM  LOS: 2 days

## 2014-10-20 NOTE — Progress Notes (Signed)
Inpatient Diabetes Program Recommendations  AACE/ADA: New Consensus Statement on Inpatient Glycemic Control (2013)  Target Ranges:  Prepandial:   less than 140 mg/dL      Peak postprandial:   less than 180 mg/dL (1-2 hours)      Critically ill patients:  140 - 180 mg/dL   Glucose levels high in 300's. Please consider the following:  Results for [name], [name] (MRN [id number]) as of 10/20/2014 10:51  Ref. Range 10/18/2014 21:29 10/19/2014 07:35 10/19/2014 12:12 10/19/2014 16:45 10/19/2014 21:38 10/20/2014 07:57  Glucose-Capillary Latest Range: 70-99 mg/dL 283 (H) 256 (H) 323 (H) 318 (H) 361 (H) 336 (H)    Inpatient Diabetes Program Recommendations Insulin - Basal: Pt takes lantus 17 units at home and definitely needs here while on high dose steroid therapy. Please add 20 units lantus Correction (SSI): Please increase correction to resistant tidwc and HS Insulin - Meal Coverage: Please add at least 4 units novolog meal coverage tidwc whiile on solumedrol and any dose prednisone greater than 10 mg/day  Inpatient Diabetes Program Recommendations Insulin - Basal: Pt takes lantus 17 units at home and definitely needs here while on high dose steroid therapy. Please add 20 units lantus Correction (SSI): Please increase correction to resistant tidwc and HS Insulin - Meal Coverage: Please add at least 4 units novolog meal coverage tidwc whiile on solumedrol and any dose prednisone greater than 10 mg/day Thank you, Rosita Kea, RN, CNS, Diabetes Coordinator  Pager [phone]) 8:00 am to 10:00pm Office [phone])  8:61m - 5:00 pm

## 2014-10-21 DIAGNOSIS — I10 Essential (primary) hypertension: Secondary | ICD-10-CM

## 2014-10-21 DIAGNOSIS — I5032 Chronic diastolic (congestive) heart failure: Secondary | ICD-10-CM

## 2014-10-21 DIAGNOSIS — J441 Chronic obstructive pulmonary disease with (acute) exacerbation: Principal | ICD-10-CM

## 2014-10-21 DIAGNOSIS — R0902 Hypoxemia: Secondary | ICD-10-CM

## 2014-10-21 DIAGNOSIS — R0602 Shortness of breath: Secondary | ICD-10-CM

## 2014-10-21 LAB — MAGNESIUM: MAGNESIUM: 2.2 mg/dL (ref 1.5–2.5)

## 2014-10-21 LAB — GLUCOSE, CAPILLARY
GLUCOSE-CAPILLARY: 372 mg/dL — AB (ref 70–99)
Glucose-Capillary: 336 mg/dL — ABNORMAL HIGH (ref 70–99)
Glucose-Capillary: 374 mg/dL — ABNORMAL HIGH (ref 70–99)

## 2014-10-21 LAB — BASIC METABOLIC PANEL
ANION GAP: 17 — AB (ref 5–15)
BUN: 30 mg/dL — ABNORMAL HIGH (ref 6–23)
CO2: 26 mmol/L (ref 19–32)
CREATININE: 1.26 mg/dL — AB (ref 0.50–1.10)
Calcium: 9.4 mg/dL (ref 8.4–10.5)
Chloride: 87 mmol/L — ABNORMAL LOW (ref 96–112)
GFR calc non Af Amer: 50 mL/min — ABNORMAL LOW (ref >90)
GFR, EST AFRICAN AMERICAN: 58 mL/min — AB (ref >90)
GLUCOSE: 488 mg/dL — AB (ref 70–99)
POTASSIUM: 4.7 mmol/L (ref 3.5–5.1)
SODIUM: 130 mmol/L — AB (ref 135–145)

## 2014-10-21 LAB — CBC
HCT: 43.2 % (ref 36.0–46.0)
HEMOGLOBIN: 13.9 g/dL (ref 12.0–15.0)
MCH: 24.9 pg — AB (ref 26.0–34.0)
MCHC: 32.2 g/dL (ref 30.0–36.0)
MCV: 77.3 fL — ABNORMAL LOW (ref 78.0–100.0)
Platelets: 177 10*3/uL (ref 150–400)
RBC: 5.59 MIL/uL — ABNORMAL HIGH (ref 3.87–5.11)
RDW: 17.7 % — AB (ref 11.5–15.5)
WBC: 10.6 10*3/uL — ABNORMAL HIGH (ref 4.0–10.5)

## 2014-10-21 MED ORDER — INSULIN GLARGINE 100 UNIT/ML ~~LOC~~ SOLN
35.0000 [IU] | Freq: Every day | SUBCUTANEOUS | Status: DC
  Administered 2014-10-21: 35 [IU] via SUBCUTANEOUS
  Filled 2014-10-21 (×2): qty 0.35

## 2014-10-21 NOTE — Progress Notes (Signed)
Inpatient Diabetes Program Recommendations  AACE/ADA: New Consensus Statement on Inpatient Glycemic Control (2013)  Target Ranges:  Prepandial:   less than 140 mg/dL      Peak postprandial:   less than 180 mg/dL (1-2 hours)      Critically ill patients:  140 - 180 mg/dL   MD increased lantus to 20 units, but needs more -glucose extremely high in 300's.  Please increase lantus to 30 units and add meal coverage of 6 units novolog tidwc.  Thank you, Rosita Kea, RN, CNS, Diabetes Coordinator  Pager [phone]) 8:00 am to 10:00pm Office [phone])  8:60m - 5:00 pm

## 2014-10-21 NOTE — Progress Notes (Signed)
Pt stated she would place herself on cpap when ready, RT informed pt to call for RT if she needs help.

## 2014-10-21 NOTE — Progress Notes (Signed)
PROGRESS NOTE  [name] TKZ:[id number] DOB: 11-24-1965 DOA: 10/18/2014 PCP: Gwendolyn Grant, MD  HPI/Recap of past 24 hours: Feeling better, breathing easier and with less cough. No CP. No fever.  Assessment/Plan: Acute on chronic resp failure due to Copd exacerbation: -continue abx/nebs/steroids/mucolytics/o2 -will continue bipap qhs. -Patient will be transfer to med-surg  Chronic Diastolic TDD:UKGURKY compensated -continue home medications  Insulin dependent DM2, continue insulin -will continue adjusting lantus for better control -given use of steroids CBG's continue to be elevated.  Chronic pain, continue home pain meds, patient also requested prn pain meds. Adequate stool regimen. -will try to minimize extra narcotics  Morbid obesity: life style modification and low calorie diet discussed   Code Status: full  Family Communication: patient at bedside  Disposition Plan: likely be able to d/c home in 1-2 days   Consultants:  none  Procedures:  none  Antibiotics:  Augmentin (patient has multiple abx allergy)   Objective: BP 138/45 mmHg  Pulse 74  Temp(Src) 97.7 F (36.5 C) (Oral)  Resp 19  Ht 5\' 8"  (1.727 m)  Wt 136.1 kg (300 lb 0.7 oz)  BMI 45.63 kg/m2  SpO2 97%  LMP 10/16/2012  Intake/Output Summary (Last 24 hours) at 10/21/14 1539 Last data filed at 10/21/14 0800  Gross per 24 hour  Intake    480 ml  Output   1900 ml  Net  -1420 ml   Filed Weights   10/21/14 1100  Weight: 136.1 kg (300 lb 0.7 oz)    Exam:   General:  Obese, afebrile, breathing better  Cardiovascular: RRR, no rubs or gallops  Respiratory: improve air entry, but still with wheezing and rhonchi.  Abdomen: obese, NT/positive bowel sounds  Musculoskeletal: no obvious edema  Data Reviewed: Basic Metabolic Panel:  Recent Labs Lab 10/18/14 1355 10/19/14 1025 10/21/14 0242  NA 136 133* 130*  K 3.7 4.5 4.7  CL 96 92* 87*  CO2 32 27 26  GLUCOSE 220*  350* 488*  BUN 15 16 30*  CREATININE 1.07 1.13* 1.26*  CALCIUM 9.9 9.5 9.4  MG  --   --  2.2   Liver Function Tests:  Recent Labs Lab 10/19/14 1025  AST 34  ALT 35  ALKPHOS 93  BILITOT 0.8  PROT 8.1  ALBUMIN 3.6   CBC:  Recent Labs Lab 10/18/14 1355 10/19/14 1025 10/21/14 0242  WBC 5.8 10.3 10.6*  HGB 13.4 14.1 13.9  HCT 41.7 43.1 43.2  MCV 76.4* 76.7* 77.3*  PLT 180 189 177   Cardiac Enzymes:    Recent Labs Lab 10/18/14 2106 10/19/14 0240 10/19/14 1025  TROPONINI <0.03 <0.03 <0.03   BNP (last 3 results)  Recent Labs  02/11/14 0021 04/17/14 0120 07/20/14 1817  PROBNP 32.8 44.7 39.7   CBG:  Recent Labs Lab 10/20/14 1137 10/20/14 1603 10/20/14 2136 10/21/14 0818 10/21/14 1200  GLUCAP 393* 369* 370* 336* 374*    Recent Results (from the past 240 hour(s))  MRSA PCR Screening     Status: None   Collection Time: 10/18/14 11:08 PM  Result Value Ref Range Status   MRSA by PCR NEGATIVE NEGATIVE Final    Comment:        The GeneXpert MRSA Assay (FDA approved for NASAL specimens only), is one component of a comprehensive MRSA colonization surveillance program. It is not intended to diagnose MRSA infection nor to guide or monitor treatment for MRSA infections.      Studies: No results found.  Scheduled Meds: .  acidophilus  1 capsule Oral Daily  . ALPRAZolam  1 mg Oral TID  . amoxicillin-clavulanate  1 tablet Oral Q12H  . antiseptic oral rinse  7 mL Mouth Rinse BID  . aspirin EC  81 mg Oral Daily  . budesonide-formoterol  2 puff Inhalation BID  . dextromethorphan-guaiFENesin  1 tablet Oral BID  . enoxaparin (LOVENOX) injection  40 mg Subcutaneous Q24H  . fluticasone  2 spray Each Nare Daily  . insulin aspart  0-15 Units Subcutaneous TID WC  . insulin aspart  0-5 Units Subcutaneous QHS  . insulin glargine  20 Units Subcutaneous QHS  . ipratropium-albuterol  3 mL Nebulization Q6H  . methylPREDNISolone (SOLU-MEDROL) injection  60 mg  Intravenous Q6H  . nicotine  14 mg Transdermal Daily  . oxyCODONE  10 mg Oral TID  . pantoprazole  40 mg Oral Daily  . senna-docusate  1 tablet Oral BID  . sertraline  100 mg Oral Daily  . spironolactone  25 mg Oral BID  . torsemide  100 mg Oral BID    Continuous Infusions: . albuterol 10 mg/hr (10/18/14 1822)     Barton Dubois  [facility] Pager [phone]. If 7PM-7AM, please contact night-coverage at [email or web address], password [facility] 10/21/2014, 3:39 PM  LOS: 3 days

## 2014-10-21 NOTE — Progress Notes (Signed)
Pt walked 80 ft and stated that she was getting tired. Vitals remained WDL with exception of HR which increased to 110. Pt tolerated activity fairly well.

## 2014-10-21 NOTE — Progress Notes (Signed)
RT set up CPAP for patient at 0100. Pt said she would place herself on when ready. RT will continue to monitor.

## 2014-10-22 ENCOUNTER — Ambulatory Visit: Payer: Self-pay | Admitting: Internal Medicine

## 2014-10-22 DIAGNOSIS — E1169 Type 2 diabetes mellitus with other specified complication: Secondary | ICD-10-CM

## 2014-10-22 LAB — GLUCOSE, CAPILLARY
Glucose-Capillary: 395 mg/dL — ABNORMAL HIGH (ref 70–99)
Glucose-Capillary: 354 mg/dL — ABNORMAL HIGH (ref 70–99)
Glucose-Capillary: 359 mg/dL — ABNORMAL HIGH (ref 70–99)
Glucose-Capillary: 382 mg/dL — ABNORMAL HIGH (ref 70–99)
Glucose-Capillary: 329 mg/dL — ABNORMAL HIGH (ref 70–99)

## 2014-10-22 LAB — HIV ANTIBODY (ROUTINE TESTING W REFLEX): HIV Screen 4th Generation wRfx: NONREACTIVE

## 2014-10-22 MED ORDER — METHYLPREDNISOLONE SODIUM SUCC 125 MG IJ SOLR
80.0000 mg | Freq: Two times a day (BID) | INTRAMUSCULAR | Status: DC
  Administered 2014-10-22 – 2014-10-23 (×3): 80 mg via INTRAVENOUS
  Filled 2014-10-22: qty 1.28
  Filled 2014-10-22: qty 2
  Filled 2014-10-22: qty 1.28

## 2014-10-22 MED ORDER — INSULIN GLARGINE 100 UNIT/ML ~~LOC~~ SOLN
22.0000 [IU] | Freq: Two times a day (BID) | SUBCUTANEOUS | Status: DC
  Administered 2014-10-22 – 2014-10-23 (×3): 22 [IU] via SUBCUTANEOUS
  Filled 2014-10-22 (×4): qty 0.22

## 2014-10-22 NOTE — Progress Notes (Signed)
PROGRESS NOTE  [name] [id number]:[id number] DOB: 1966-01-06 DOA: 10/18/2014 PCP: Gwendolyn Grant, MD  HPI/Recap of past 24 hours: Feeling better; reports that her breathing continued improving. Denies chest pain, fever, abdominal pain or any other acute complaints.  Assessment/Plan: Acute on chronic resp failure due to Copd exacerbation: -continue abx/nebs/mucolytics and o2 supplementation as needed -Will start tapering her steroids (will switch to twice a day Solu-Medrol today and if well tolerated, with continue improvement of her breathing will anticipate discharge home tomorrow; with a slow prednisone tapering -will continue bipap qhs.  Chronic Diastolic chf: appears compensated -continue home medications -Low sodium diet has been advised -Daily weights -Medication compliance and weight loss has been discussed.  Insulin dependent DM2, continue insulin -will continue adjusting lantus for better control -given use of steroids CBG's continue to be elevated. -Anticipate an improvement in her CBGs once steroids are tapered  Chronic pain: continue home pain meds; patient also requested prn pain meds. Adequate stool regimen. -will try to minimize extra narcotics  Morbid obesity: life style modification and low calorie diet discussed -Body mass index is 45.63 kg/(m^2).  Anxiety/depression: Continue Zoloft and Xanax  Code Status: full  Family Communication: patient at bedside  Disposition Plan: likely be able to d/c home in 1-2 days   Consultants:  none  Procedures:  none  Antibiotics:  Augmentin (patient has multiple abx allergy)   Objective: BP 158/93 mmHg  Pulse 90  Temp(Src) 98 F (36.7 C) (Axillary)  Resp 21  Ht 5\' 8"  (1.727 m)  Wt 136.1 kg (300 lb 0.7 oz)  BMI 45.63 kg/m2  SpO2 94%  LMP 10/16/2012  Intake/Output Summary (Last 24 hours) at 10/22/14 1505 Last data filed at 10/22/14 0958  Gross per 24 hour  Intake    840 ml  Output      0 ml    Net    840 ml   Filed Weights   10/21/14 1100  Weight: 136.1 kg (300 lb 0.7 oz)    Exam:   General:  Obese, afebrile, breathing continued improving; no chest pain, diaphoresis, nausea or vomiting.   Cardiovascular: RRR, no rubs or gallops  Respiratory: improve air entry, mild end expiratory wheezing and rhonchi appreciated on exam.  Abdomen: obese, NT/positive bowel sounds  Musculoskeletal: no obvious edema  Data Reviewed: Basic Metabolic Panel:  Recent Labs Lab 10/18/14 1355 10/19/14 1025 10/21/14 0242  NA 136 133* 130*  K 3.7 4.5 4.7  CL 96 92* 87*  CO2 32 27 26  GLUCOSE 220* 350* 488*  BUN 15 16 30*  CREATININE 1.07 1.13* 1.26*  CALCIUM 9.9 9.5 9.4  MG  --   --  2.2   Liver Function Tests:  Recent Labs Lab 10/19/14 1025  AST 34  ALT 35  ALKPHOS 93  BILITOT 0.8  PROT 8.1  ALBUMIN 3.6   CBC:  Recent Labs Lab 10/18/14 1355 10/19/14 1025 10/21/14 0242  WBC 5.8 10.3 10.6*  HGB 13.4 14.1 13.9  HCT 41.7 43.1 43.2  MCV 76.4* 76.7* 77.3*  PLT 180 189 177   Cardiac Enzymes:    Recent Labs Lab 10/18/14 2106 10/19/14 0240 10/19/14 1025  TROPONINI <0.03 <0.03 <0.03   BNP (last 3 results)  Recent Labs  02/11/14 0021 04/17/14 0120 07/20/14 1817  PROBNP 32.8 44.7 39.7   CBG:  Recent Labs Lab 10/21/14 1200 10/21/14 1638 10/21/14 2045 10/22/14 0748 10/22/14 1141  GLUCAP 374* 372* 395* 354* 359*    Recent Results (from the  past 240 hour(s))  MRSA PCR Screening     Status: None   Collection Time: 10/18/14 11:08 PM  Result Value Ref Range Status   MRSA by PCR NEGATIVE NEGATIVE Final    Comment:        The GeneXpert MRSA Assay (FDA approved for NASAL specimens only), is one component of a comprehensive MRSA colonization surveillance program. It is not intended to diagnose MRSA infection nor to guide or monitor treatment for MRSA infections.      Studies: No results found.  Scheduled Meds: . acidophilus  1 capsule Oral  Daily  . ALPRAZolam  1 mg Oral TID  . amoxicillin-clavulanate  1 tablet Oral Q12H  . antiseptic oral rinse  7 mL Mouth Rinse BID  . aspirin EC  81 mg Oral Daily  . budesonide-formoterol  2 puff Inhalation BID  . dextromethorphan-guaiFENesin  1 tablet Oral BID  . enoxaparin (LOVENOX) injection  40 mg Subcutaneous Q24H  . fluticasone  2 spray Each Nare Daily  . insulin aspart  0-15 Units Subcutaneous TID WC  . insulin aspart  0-5 Units Subcutaneous QHS  . insulin glargine  22 Units Subcutaneous BID  . ipratropium-albuterol  3 mL Nebulization Q6H  . methylPREDNISolone (SOLU-MEDROL) injection  80 mg Intravenous Q12H  . nicotine  14 mg Transdermal Daily  . oxyCODONE  10 mg Oral TID  . pantoprazole  40 mg Oral Daily  . senna-docusate  1 tablet Oral BID  . sertraline  100 mg Oral Daily  . spironolactone  25 mg Oral BID  . torsemide  100 mg Oral BID    Continuous Infusions: . albuterol 10 mg/hr (10/18/14 1822)     Barton Dubois  [facility] Pager [phone]. If 7PM-7AM, please contact night-coverage at [email or web address], password [email or web address] 10/22/2014, 3:05 PM  LOS: 4 days

## 2014-10-22 NOTE — Consult Note (Signed)
Patient had been previously active with [facility]. Spoke with inpatient RNCM and felt that the patient could benefit from [facility] services.  Met with the patient and she was agreeable to services with [facility]. Consent form signed and folder with [facility] given. Patient will received follow up telephone calls and be assessed for home visits.  Of note, [facility] Management does not interfere with or replace any services arranged by inpatient RNCM or social worker. For questions, please call Natividad Brood, RN, BSN, CCM, [facility] Liaison at [phone].

## 2014-10-23 ENCOUNTER — Other Ambulatory Visit: Payer: Self-pay | Admitting: Internal Medicine

## 2014-10-23 ENCOUNTER — Inpatient Hospital Stay (HOSPITAL_COMMUNITY): Payer: Medicare Other

## 2014-10-23 LAB — BASIC METABOLIC PANEL
Anion gap: 15 (ref 5–15)
Anion gap: 12 (ref 5–15)
BUN: 36 mg/dL — ABNORMAL HIGH (ref 6–23)
BUN: 34 mg/dL — ABNORMAL HIGH (ref 6–23)
CHLORIDE: 85 mmol/L — AB (ref 96–112)
CO2: 32 mmol/L (ref 19–32)
CO2: 35 mmol/L — ABNORMAL HIGH (ref 19–32)
CREATININE: 1.25 mg/dL — AB (ref 0.50–1.10)
Calcium: 9.4 mg/dL (ref 8.4–10.5)
Calcium: 9.4 mg/dL (ref 8.4–10.5)
Chloride: 84 mmol/L — ABNORMAL LOW (ref 96–112)
Creatinine, Ser: 1.42 mg/dL — ABNORMAL HIGH (ref 0.50–1.10)
GFR calc Af Amer: 58 mL/min — ABNORMAL LOW (ref >90)
GFR, EST AFRICAN AMERICAN: 50 mL/min — AB (ref >90)
GFR, EST NON AFRICAN AMERICAN: 43 mL/min — AB (ref >90)
GFR, EST NON AFRICAN AMERICAN: 50 mL/min — AB (ref >90)
Glucose, Bld: 398 mg/dL — ABNORMAL HIGH (ref 70–99)
Glucose, Bld: 373 mg/dL — ABNORMAL HIGH (ref 70–99)
POTASSIUM: 3.9 mmol/L (ref 3.5–5.1)
Potassium: 3.4 mmol/L — ABNORMAL LOW (ref 3.5–5.1)
Sodium: 132 mmol/L — ABNORMAL LOW (ref 135–145)
Sodium: 131 mmol/L — ABNORMAL LOW (ref 135–145)

## 2014-10-23 LAB — GLUCOSE, CAPILLARY
GLUCOSE-CAPILLARY: 267 mg/dL — AB (ref 70–99)
GLUCOSE-CAPILLARY: 414 mg/dL — AB (ref 70–99)
GLUCOSE-CAPILLARY: 315 mg/dL — AB (ref 70–99)
Glucose-Capillary: 437 mg/dL — ABNORMAL HIGH (ref 70–99)

## 2014-10-23 MED ORDER — INSULIN ASPART 100 UNIT/ML ~~LOC~~ SOLN
6.0000 [IU] | Freq: Once | SUBCUTANEOUS | Status: AC
  Administered 2014-10-23: 6 [IU] via SUBCUTANEOUS

## 2014-10-23 MED ORDER — TORSEMIDE 100 MG PO TABS
100.0000 mg | ORAL_TABLET | Freq: Two times a day (BID) | ORAL | Status: DC
  Administered 2014-10-23 – 2014-10-24 (×2): 100 mg via ORAL
  Filled 2014-10-23 (×4): qty 1

## 2014-10-23 MED ORDER — METHYLPREDNISOLONE SODIUM SUCC 125 MG IJ SOLR
60.0000 mg | Freq: Two times a day (BID) | INTRAMUSCULAR | Status: DC
  Administered 2014-10-23 – 2014-10-24 (×2): 60 mg via INTRAVENOUS
  Filled 2014-10-23 (×2): qty 0.96

## 2014-10-23 MED ORDER — INSULIN GLARGINE 100 UNIT/ML ~~LOC~~ SOLN
25.0000 [IU] | Freq: Two times a day (BID) | SUBCUTANEOUS | Status: DC
  Administered 2014-10-23 – 2014-10-24 (×2): 25 [IU] via SUBCUTANEOUS
  Filled 2014-10-23 (×3): qty 0.25

## 2014-10-23 MED ORDER — TORSEMIDE 100 MG PO TABS: 100.0000 mg | ORAL_TABLET | Freq: Every day | ORAL | Status: DC

## 2014-10-23 MED ORDER — POTASSIUM CHLORIDE CRYS ER 20 MEQ PO TBCR
20.0000 meq | EXTENDED_RELEASE_TABLET | Freq: Once | ORAL | Status: AC
  Administered 2014-10-23: 20 meq via ORAL
  Filled 2014-10-23: qty 1

## 2014-10-23 NOTE — Progress Notes (Signed)
Results for SHELBY, PELTZ (MRN [id number]) as of 10/23/2014 12:49  Ref. Range 10/22/2014 16:47 10/22/2014 21:12 10/23/2014 08:03 10/23/2014 09:53 10/23/2014 11:53  Glucose-Capillary Latest Range: 70-99 mg/dL 382 (H) 329 (H) 267 (H)  414 (H)  CBGs continue to be greater than 200 mg/dl. Recommend increasing Lantus to 25 units BID, increase Novolog correction scale to MODERATE, and add Novolog 5-6 units TID as meal coverage if patient eats at least 50% of meals. Will follow while in hospital. Harvel Ricks RN BSN CDE

## 2014-10-23 NOTE — Progress Notes (Signed)
Pt. Blood sugar 414 MD notified, advised to give all of sliding scale insulin (15 u, novolog)   12:25 PM 10/23/2014 Penni Bombard, RN

## 2014-10-23 NOTE — Progress Notes (Addendum)
Pt places herself on and off of the BIPAP machine at will. No acute distress noted at this time. Pt refused her HHN treatment now, but asked for me to give her the one due at 0200.

## 2014-10-23 NOTE — Care Management Note (Signed)
CARE MANAGEMENT NOTE 10/23/2014  Patient:  [name], [name]   Account Number:  [id number]  Date Initiated:  10/19/2014  Documentation initiated by:  Elissa Hefty  Subjective/Objective Assessment:   adm w copd exacerb     Action/Plan:   lives alone, pcp dr Asa Lente  10/23/14 CM following for d/c needs,   Anticipated DC Date:     Anticipated DC Plan:  [place]         Choice offered to / List presented to:             Status of service:   Medicare Important Message given?  YES (If response is "NO", the following Medicare IM given date fields will be blank) Date Medicare IM given:  10/21/2014 Medicare IM given by:  Elissa Hefty Date Additional Medicare IM given:  10/23/2014 Additional Medicare IM given by:  [facility]  Discharge Disposition:    Per UR Regulation:    If discussed at Long Length of Stay Meetings, dates discussed:    Comments:  1/25 1023 debbie dowell rn,bsn chart states get aid and hhrn, has o2 w [facility].

## 2014-10-23 NOTE — Progress Notes (Signed)
PROGRESS NOTE  [name] [id number]:[id number] DOB: November 27, 1965 DOA: 10/18/2014 PCP: Gwendolyn Grant, MD  HPI/Recap of past 24 hours: Relates worsening dyspnea today, after she walk around.  Her oxygen saturation was low, she was not wearing the oxygen.   Assessment/Plan: Acute on chronic resp failure due to Copd exacerbation: -continue abx/nebs/mucolytics and o2 supplementation as needed -Decrease  Solu-Medrol to 60 Mg IV BID.  -will continue bipap qhs. -repeat chest x ray today.   Chronic Diastolic chf: appears compensated -continue home medications -Low sodium diet has been advised -Daily weights -will hold spironolactone due to worsening cr. Will continue with Demadex.   Insulin dependent DM2, continue insulin -will continue adjusting lantus for better control -given use of steroids CBG's continue to be elevated. -increase lantus to 25 BID.   AKI; Discontinue spironolactone.  Monitor on Demadex.  Repeat labs in am.   Chronic pain: continue home pain meds. Adequate stool regimen. -will try to minimize extra narcotics  Morbid obesity: life style modification and low calorie diet discussed -Body mass index is 44.09 kg/(m^2).  Anxiety/depression: Continue Zoloft and Xanax  Code Status: full  Family Communication: patient at bedside  Disposition Plan: likely be able to d/c home in 1-2 days   Consultants:  none  Procedures:  none  Antibiotics:  Augmentin (patient has multiple abx allergy)   Objective: BP 148/79 mmHg  Pulse 76  Temp(Src) 97.4 F (36.3 C) (Oral)  Resp 16  Ht 5\' 8"  (1.727 m)  Wt 131.498 kg (289 lb 14.4 oz)  BMI 44.09 kg/m2  SpO2 97%  LMP 10/16/2012  Intake/Output Summary (Last 24 hours) at 10/23/14 1502 Last data filed at 10/23/14 1458  Gross per 24 hour  Intake   1080 ml  Output      0 ml  Net   1080 ml   Filed Weights   10/21/14 1100 10/22/14 2112  Weight: 136.1 kg (300 lb 0.7 oz) 131.498 kg (289 lb 14.4 oz)     Exam:   General:  Obese, NAD  Cardiovascular: RRR, no rubs or gallops  Respiratory: improve air entry, no wheezing.   Abdomen: obese, NT/positive bowel sounds  Musculoskeletal: no obvious edema  Data Reviewed: Basic Metabolic Panel:  Recent Labs Lab 10/18/14 1355 10/19/14 1025 10/21/14 0242 10/23/14 0953  NA 136 133* 130* 132*  K 3.7 4.5 4.7 3.9  CL 96 92* 87* 85*  CO2 32 27 26 32  GLUCOSE 220* 350* 488* 398*  BUN 15 16 30* 36*  CREATININE 1.07 1.13* 1.26* 1.42*  CALCIUM 9.9 9.5 9.4 9.4  MG  --   --  2.2  --    Liver Function Tests:  Recent Labs Lab 10/19/14 1025  AST 34  ALT 35  ALKPHOS 93  BILITOT 0.8  PROT 8.1  ALBUMIN 3.6   CBC:  Recent Labs Lab 10/18/14 1355 10/19/14 1025 10/21/14 0242  WBC 5.8 10.3 10.6*  HGB 13.4 14.1 13.9  HCT 41.7 43.1 43.2  MCV 76.4* 76.7* 77.3*  PLT 180 189 177   Cardiac Enzymes:    Recent Labs Lab 10/18/14 2106 10/19/14 0240 10/19/14 1025  TROPONINI <0.03 <0.03 <0.03   BNP (last 3 results)  Recent Labs  02/11/14 0021 04/17/14 0120 07/20/14 1817  PROBNP 32.8 44.7 39.7   CBG:  Recent Labs Lab 10/22/14 1141 10/22/14 1647 10/22/14 2112 10/23/14 0803 10/23/14 1153  GLUCAP 359* 382* 329* 267* 414*    Recent Results (from the past 240 hour(s))  MRSA PCR Screening  Status: None   Collection Time: 10/18/14 11:08 PM  Result Value Ref Range Status   MRSA by PCR NEGATIVE NEGATIVE Final    Comment:        The GeneXpert MRSA Assay (FDA approved for NASAL specimens only), is one component of a comprehensive MRSA colonization surveillance program. It is not intended to diagnose MRSA infection nor to guide or monitor treatment for MRSA infections.      Studies: No results found.  Scheduled Meds: . acidophilus  1 capsule Oral Daily  . ALPRAZolam  1 mg Oral TID  . amoxicillin-clavulanate  1 tablet Oral Q12H  . antiseptic oral rinse  7 mL Mouth Rinse BID  . aspirin EC  81 mg Oral Daily   . budesonide-formoterol  2 puff Inhalation BID  . dextromethorphan-guaiFENesin  1 tablet Oral BID  . enoxaparin (LOVENOX) injection  40 mg Subcutaneous Q24H  . fluticasone  2 spray Each Nare Daily  . insulin aspart  0-15 Units Subcutaneous TID WC  . insulin aspart  0-5 Units Subcutaneous QHS  . insulin glargine  25 Units Subcutaneous BID  . ipratropium-albuterol  3 mL Nebulization Q6H  . methylPREDNISolone (SOLU-MEDROL) injection  60 mg Intravenous Q12H  . nicotine  14 mg Transdermal Daily  . oxyCODONE  10 mg Oral TID  . pantoprazole  40 mg Oral Daily  . senna-docusate  1 tablet Oral BID  . sertraline  100 mg Oral Daily  . torsemide  100 mg Oral BID    Continuous Infusions: . albuterol 10 mg/hr (10/18/14 1822)     Niel Hummer A  [facility] Pager [phone]. If 7PM-7AM, please contact night-coverage at [email or web address], password [facility] 10/23/2014, 3:02 PM  LOS: 5 days

## 2014-10-24 LAB — GLUCOSE, CAPILLARY
GLUCOSE-CAPILLARY: 279 mg/dL — AB (ref 70–99)
Glucose-Capillary: 305 mg/dL — ABNORMAL HIGH (ref 70–99)

## 2014-10-24 LAB — BASIC METABOLIC PANEL
Anion gap: 10 (ref 5–15)
BUN: 30 mg/dL — ABNORMAL HIGH (ref 6–23)
CALCIUM: 9 mg/dL (ref 8.4–10.5)
CO2: 34 mmol/L — ABNORMAL HIGH (ref 19–32)
CREATININE: 1.04 mg/dL (ref 0.50–1.10)
Chloride: 87 mmol/L — ABNORMAL LOW (ref 96–112)
GFR calc Af Amer: 73 mL/min — ABNORMAL LOW (ref >90)
GFR, EST NON AFRICAN AMERICAN: 63 mL/min — AB (ref >90)
Glucose, Bld: 247 mg/dL — ABNORMAL HIGH (ref 70–99)
Potassium: 3.9 mmol/L (ref 3.5–5.1)
Sodium: 131 mmol/L — ABNORMAL LOW (ref 135–145)

## 2014-10-24 MED ORDER — PREDNISONE 20 MG PO TABS: ORAL_TABLET | ORAL | Status: DC

## 2014-10-24 MED ORDER — POTASSIUM CHLORIDE CRYS ER 20 MEQ PO TBCR: 20.0000 meq | EXTENDED_RELEASE_TABLET | Freq: Every day | ORAL | Status: DC

## 2014-10-24 MED ORDER — IPRATROPIUM-ALBUTEROL 0.5-2.5 (3) MG/3ML IN SOLN
3.0000 mL | Freq: Four times a day (QID) | RESPIRATORY_TRACT | Status: DC
  Administered 2014-10-24: 3 mL via RESPIRATORY_TRACT
  Filled 2014-10-24 (×2): qty 3

## 2014-10-24 MED ORDER — DM-GUAIFENESIN ER 30-600 MG PO TB12: 1.0000 | ORAL_TABLET | Freq: Two times a day (BID) | ORAL | Status: DC

## 2014-10-24 MED ORDER — PREDNISONE 50 MG PO TABS
50.0000 mg | ORAL_TABLET | Freq: Every day | ORAL | Status: DC
  Filled 2014-10-24: qty 1

## 2014-10-24 MED ORDER — INSULIN GLARGINE 100 UNIT/ML SOLOSTAR PEN: 23.0000 [IU] | PEN_INJECTOR | Freq: Every day | SUBCUTANEOUS | Status: DC

## 2014-10-24 MED ORDER — AMOXICILLIN-POT CLAVULANATE 875-125 MG PO TABS: 1.0000 | ORAL_TABLET | Freq: Two times a day (BID) | ORAL | Status: DC

## 2014-10-24 MED ORDER — RISAQUAD PO CAPS: 1.0000 | ORAL_CAPSULE | Freq: Every day | ORAL | Status: DC

## 2014-10-24 MED ORDER — ALBUTEROL SULFATE (2.5 MG/3ML) 0.083% IN NEBU: 2.5000 mg | INHALATION_SOLUTION | RESPIRATORY_TRACT | Status: DC | PRN

## 2014-10-24 NOTE — Progress Notes (Signed)
Placed patient on CPAP for the night with oxygen set a 4lpm

## 2014-10-24 NOTE — Discharge Summary (Signed)
Physician Discharge Summary  [name]:[id number] DOB: 1966/04/03 DOA: 10/18/2014  PCP: Gwendolyn Grant, MD  Admit date: 10/18/2014 Discharge date: 10/24/2014  Time spent: 35 minutes  Recommendations for Outpatient Follow-up:  Needs B-met to follow electrolytes and renal function.  Consider resuming spironolactone if renal function allows it.   Discharge Diagnoses:    Acute COPD exacerbation   SOB (shortness of breath)   Chronic diastolic heart failure   AKI   Diabetes.    Discharge Condition: stable.   Diet recommendation: heart heathy// carb modified.   Filed Weights   10/21/14 1100 10/22/14 2112  Weight: 136.1 kg (300 lb 0.7 oz) 131.498 kg (289 lb 14.4 oz)    History of present illness:  Patient reports chronic h/o shortness of breath on exertion and with lying flat with productive cough. Symptoms have been worse for past 2-3 days. She was on a course of cough syrup, prednisone and amoxicillin about 3 weeks ago which did not improve symptoms. Today she had 2-3 episodes of a sharp midsternal chest pain lasting seconds. She's no chest pain currently. She has had chills but no fever. She has had mild edema in her hands and her feet which she has noticed today.She has been compliant with her home O2 and BiPAP. Also reported yeast infection, requested diflucan. She denies fever, cough in nonproductive, no running nose, no sore throat, no palpitation, no n/v, no diarrhea.  Hospital Course:  Acute on chronic resp failure due to Copd exacerbation: -continue abx/nebs/mucolytics and o2 supplementation as needed -change solumedrol to prednisone.  -will continue bipap qhs. -repeat chest x ray showed mild pulmonary congestion. Continue with demadex.   Chronic Diastolic chf: appears compensated -continue home medications -Low sodium diet has been advised -Daily weights -will hold spironolactone due to worsening cr. Will continue with Demadex.  -weight down 136 kg to  131.   Insulin dependent DM2, continue insulin -will continue adjusting lantus for better control -given use of steroids CBG's continue to be elevated. -increase lantus to 25 BID.   AKI; Discontinue spironolactone.  Monitor on Demadex.  Renal function stable.   Chronic pain: continue home pain meds. Adequate stool regimen. -will try to minimize extra narcotics  Morbid obesity: life style modification and low calorie diet discussed -Body mass index is 44.09 kg/(m^2).  Anxiety/depression: Continue Zoloft and Xanax  Procedures:  none  Consultations:  none  Discharge Exam: Filed Vitals:   10/24/14 1053  BP: 117/54  Pulse: 77  Temp: 97.6 F (36.4 C)  Resp: 16    General: Alert in no distress.  Cardiovascular: S 1, S 2 RRR Respiratory: CTA  Discharge Instructions   Discharge Instructions    Diet - low sodium heart healthy    Complete by:  As directed      Increase activity slowly    Complete by:  As directed           Current Discharge Medication List    START taking these medications   Details  acidophilus (RISAQUAD) CAPS capsule Take 1 capsule by mouth daily. Qty: 30 capsule, Refills: 0    amoxicillin-clavulanate (AUGMENTIN) 875-125 MG per tablet Take 1 tablet by mouth every 12 (twelve) hours. Qty: 2 tablet, Refills: 0    dextromethorphan-guaiFENesin (MUCINEX DM) 30-600 MG per 12 hr tablet Take 1 tablet by mouth 2 (two) times daily. Qty: 60 tablet, Refills: 0      CONTINUE these medications which have CHANGED   Details  Insulin Glargine (LANTUS SOLOSTAR) 100  UNIT/ML Solostar Pen Inject 23 Units into the skin at bedtime. Qty: 31 mL, Refills: 5    potassium chloride SA (K-DUR,KLOR-CON) 20 MEQ tablet Take 1 tablet (20 mEq total) by mouth daily. Qty: 180 tablet, Refills: 1    predniSONE (DELTASONE) 20 MG tablet Take 3 tablets for 3 days then 2 tablets for 3 days then 1 tablet for 2 days then stop. Qty: 17 tablet, Refills: 0      CONTINUE these  medications which have NOT CHANGED   Details  albuterol (PROVENTIL) (2.5 MG/3ML) 0.083% nebulizer solution INHALE THE CONTENTS OF 1 VIAL VIA NEBULIZER EVERY 6 HOURS AS NEEDED FOR WHEEZING OR SHORTNESS OF BREATH Qty: 75 mL, Refills: 0    ALPRAZolam (XANAX) 1 MG tablet Take 1 mg by mouth 3 (three) times daily.    aspirin EC 81 MG tablet Take 81 mg by mouth daily.    BIOTIN PO Take 1 capsule by mouth daily.    cetirizine (ZYRTEC) 1 MG/ML syrup TAKE 10 ML BY MOUTH EVERY NIGHT AT BEDTIME Qty: 300 mL, Refills: 0    Coenzyme Q10 (CO Q 10) 10 MG CAPS Take 1 capsule by mouth daily.    esomeprazole (NEXIUM) 40 MG capsule Take 40 mg by mouth 2 (two) times daily before a meal.    gabapentin (NEURONTIN) 300 MG capsule Take 300 mg by mouth at bedtime.    glimepiride (AMARYL) 1 MG tablet Take 1 mg by mouth daily with breakfast.    hydrOXYzine (ATARAX/VISTARIL) 10 MG tablet TAKE 1 TABLET BY MOUTH THREE TIMES DAILY AS NEEDED FOR ITCHING Qty: 30 tablet, Refills: 11    insulin aspart (NOVOLOG FLEXPEN) 100 UNIT/ML FlexPen Use as directed per sliding scale indicated by your physician. Qty: 21 mL, Refills: 5    Liraglutide (VICTOZA) 18 MG/3ML SOPN Inject 0.6 mg into the skin daily.    Multiple Vitamin (MULTIVITAMIN) capsule Take 1 capsule by mouth daily.    omega-3 fish oil (MAXEPA) 1000 MG CAPS capsule Take 1 capsule by mouth daily.    Oxycodone HCl 10 MG TABS Take 1 tablet (10 mg total) by mouth 3 (three) times daily as needed (severe pain). Qty: 90 tablet, Refills: 0    PROAIR HFA 108 (90 BASE) MCG/ACT inhaler INHALE 2 PUFFS BY MOUTH EVERY 6 HOURS AS NEEDED Qty: 8.5 g, Refills: 1    SPIRIVA HANDIHALER 18 MCG inhalation capsule INHALE CONTENTS OF 1 CAPSULE USING HANDIHALER EVERY DAY Qty: 30 capsule, Refills: 3    tiZANidine (ZANAFLEX) 4 MG tablet Take 1 tablet (4 mg total) by mouth every 8 (eight) hours as needed for muscle spasms. Qty: 30 tablet, Refills: 2    torsemide (DEMADEX) 100 MG  tablet Take 1 tablet (100 mg total) by mouth 2 (two) times daily. Qty: 60 tablet, Refills: 1    fluticasone (FLONASE) 50 MCG/ACT nasal spray Place 2 sprays into both nostrils daily. Qty: 16 g, Refills: 5    Insulin Pen Needle 31G X 8 MM MISC Use to administer insulin (Solostar Pen). Qty: 100 each, Refills: 12    sertraline (ZOLOFT) 100 MG tablet Take 100 mg by mouth daily.     SYMBICORT 160-4.5 MCG/ACT inhaler INHALE 2 PUFFS BY MOUTH TWICE DAILY Qty: 10.2 g, Refills: 3      STOP taking these medications     spironolactone (ALDACTONE) 25 MG tablet      fluconazole (DIFLUCAN) 150 MG tablet      HYDROcodone-homatropine (HYCODAN) 5-1.5 MG/5ML syrup  HYDROcodone-homatropine (HYDROMET) 5-1.5 MG/5ML syrup      ketorolac (TORADOL) 10 MG tablet        Allergies  Allergen Reactions  . Simvastatin Other (See Comments)    Severe leg pain and burning  . Sulfamethoxazole-Trimethoprim Nausea And Vomiting    Causes Projectile Vomiting  . Zolpidem Tartrate Other (See Comments)    Chest pain  . Azithromycin Other (See Comments)    Resistant to med  . Doxycycline Other (See Comments)    Resistant to med  . Latex Itching and Rash    Also burning sensations  . Metformin And Related Diarrhea    Severe diarrhea  . Metronidazole Nausea And Vomiting  . Metolazone Other (See Comments)    Cramps,pain  . Ciprofloxacin Nausea Only   Follow-up Information    Follow up with Dola Argyle, MD In 1 week.   Specialty:  Cardiology   Why:  The office will call you on Monday with the Appt date and time.    Contact information:   [address] [place] [address] [phone]        The results of significant diagnostics from this hospitalization (including imaging, microbiology, ancillary and laboratory) are listed below for reference.    Significant Diagnostic Studies: Dg Chest 2 View  10/23/2014   CLINICAL DATA:  49 year old female with history of COPD  exacerbation. Cough and shortness of breath, with swelling in the hands, feet and face.  EXAM: CHEST  2 VIEW  COMPARISON:  Chest x-ray 10/18/2014.  FINDINGS: Emphysematous changes again noted. Patchy areas of linear scarring throughout the mid to lower lungs bilaterally are unchanged compared to prior examinations. There is cephalization of the pulmonary vasculature and slight indistinctness of the interstitial markings suggestive of mild pulmonary edema. Mild cardiomegaly. Trace bilateral pleural effusions. Upper mediastinal contours are within normal limits. Atherosclerosis in the thoracic aorta.  IMPRESSION: 1. The appearance the chest suggests mild congestive heart failure, as above. 2. Multifocal chronic scarring throughout the mid to lower lungs bilaterally is unchanged. 3. Emphysema. 4. Atherosclerosis.   Electronically Signed   By: [name] M.D.   On: 10/23/2014 14:55   Dg Chest 2 View  10/18/2014   CLINICAL DATA:  Shortness of breath, chest pain and history of COPD and sarcoidosis.  EXAM: CHEST - 2 VIEW  COMPARISON:  07/22/2014  FINDINGS: Stable significant underlying chronic interstitial lung disease with multiple areas of stable bilateral parenchymal scarring. There is no evidence of pulmonary edema, consolidation, pneumothorax, nodule or pleural fluid. The heart size is stable and normal. Stable prominence of central pulmonary arteries. The bony thorax is unremarkable.  IMPRESSION: Stable chronic lung disease.   Electronically Signed   By: Aletta Edouard M.D.   On: 10/18/2014 14:23    Microbiology: Recent Results (from the past 240 hour(s))  MRSA PCR Screening     Status: None   Collection Time: 10/18/14 11:08 PM  Result Value Ref Range Status   MRSA by PCR NEGATIVE NEGATIVE Final    Comment:        The GeneXpert MRSA Assay (FDA approved for NASAL specimens only), is one component of a comprehensive MRSA colonization surveillance program. It is not intended to diagnose  MRSA infection nor to guide or monitor treatment for MRSA infections.      Labs: Basic Metabolic Panel:  Recent Labs Lab 10/19/14 1025 10/21/14 0242 10/23/14 0953 10/23/14 1620 10/24/14 0914  NA 133* 130* 132* 131* 131*  K 4.5 4.7  3.9 3.4* 3.9  CL 92* 87* 85* 84* 87*  CO2 27 26 32 35* 34*  GLUCOSE 350* 488* 398* 373* 247*  BUN 16 30* 36* 34* 30*  CREATININE 1.13* 1.26* 1.42* 1.25* 1.04  CALCIUM 9.5 9.4 9.4 9.4 9.0  MG  --  2.2  --   --   --    Liver Function Tests:  Recent Labs Lab 10/19/14 1025  AST 34  ALT 35  ALKPHOS 93  BILITOT 0.8  PROT 8.1  ALBUMIN 3.6   No results for input(s): LIPASE, AMYLASE in the last 168 hours. No results for input(s): AMMONIA in the last 168 hours. CBC:  Recent Labs Lab 10/18/14 1355 10/19/14 1025 10/21/14 0242  WBC 5.8 10.3 10.6*  HGB 13.4 14.1 13.9  HCT 41.7 43.1 43.2  MCV 76.4* 76.7* 77.3*  PLT 180 189 177   Cardiac Enzymes:  Recent Labs Lab 10/18/14 2106 10/19/14 0240 10/19/14 1025  TROPONINI <0.03 <0.03 <0.03   BNP: BNP (last 3 results)  Recent Labs  02/11/14 0021 04/17/14 0120 07/20/14 1817  PROBNP 32.8 44.7 39.7   CBG:  Recent Labs Lab 10/23/14 0803 10/23/14 1153 10/23/14 1710 10/23/14 2122 10/24/14 0804  GLUCAP 267* 414* 315* 437* 279*       Signed:  Cerita Rabelo A  [facility] 10/24/2014, 12:04 PM

## 2014-10-26 NOTE — Telephone Encounter (Signed)
Is this ok to be refill...[name]

## 2014-10-27 NOTE — Telephone Encounter (Signed)
LVM for pt to call back.   RE: Provider not refilling alprazolam. MD note below.

## 2014-10-27 NOTE — Telephone Encounter (Signed)
Will deny, given her recent dispenses information ran her through the [place] narcotic database. Firstly, she filled xanax 1 mg #90 on 10/23/14 so this is very EARLY for refill. The refill before that was on 09/25/14 which is less than 1 month prior. Secondly, our records would indicate that she is only taking oxycodone for pain which Dr. Asa Lente is prescribing. The [place] narcotic database would indicate that she is double dipping and has a second provider [facility]) that is providing her with hydrocodone # 120 per month which was last filled 10/05/14 while we are giving her oxycodone # 90 per month. Additionally she is going to at least 3 pharmacies for her various medications (in last 1 month [facility], [facility], [facility], [facility]). Would also recommend urine drug screen for her randomly (would call her to come in for one today! If not appropriate to our contract with her would not rx any narcotics for her either. Recommend strongly a visit with her PCP to discuss. Will not fill controlled for her.

## 2014-10-28 NOTE — Telephone Encounter (Signed)
LVM for pt to call back.

## 2014-10-30 ENCOUNTER — Ambulatory Visit: Payer: Self-pay | Admitting: Family

## 2014-10-31 ENCOUNTER — Encounter (HOSPITAL_COMMUNITY): Payer: Self-pay | Admitting: Emergency Medicine

## 2014-10-31 ENCOUNTER — Emergency Department (HOSPITAL_COMMUNITY)
Admission: EM | Admit: 2014-10-31 | Discharge: 2014-10-31 | Disposition: A | Discharge status: Home / Self Care | Payer: Medicare Other | Attending: Emergency Medicine | Admitting: Emergency Medicine

## 2014-10-31 ENCOUNTER — Emergency Department (HOSPITAL_COMMUNITY): Payer: Medicare Other

## 2014-10-31 DIAGNOSIS — I509 Heart failure, unspecified: Secondary | ICD-10-CM | POA: Diagnosis not present

## 2014-10-31 DIAGNOSIS — Z86010 Personal history of colonic polyps: Secondary | ICD-10-CM | POA: Insufficient documentation

## 2014-10-31 DIAGNOSIS — Z87891 Personal history of nicotine dependence: Secondary | ICD-10-CM | POA: Insufficient documentation

## 2014-10-31 DIAGNOSIS — E785 Hyperlipidemia, unspecified: Secondary | ICD-10-CM | POA: Diagnosis not present

## 2014-10-31 DIAGNOSIS — I10 Essential (primary) hypertension: Secondary | ICD-10-CM | POA: Diagnosis not present

## 2014-10-31 DIAGNOSIS — F419 Anxiety disorder, unspecified: Secondary | ICD-10-CM | POA: Diagnosis not present

## 2014-10-31 DIAGNOSIS — Z9981 Dependence on supplemental oxygen: Secondary | ICD-10-CM | POA: Diagnosis not present

## 2014-10-31 DIAGNOSIS — E1165 Type 2 diabetes mellitus with hyperglycemia: Secondary | ICD-10-CM | POA: Diagnosis not present

## 2014-10-31 DIAGNOSIS — Z86018 Personal history of other benign neoplasm: Secondary | ICD-10-CM | POA: Insufficient documentation

## 2014-10-31 DIAGNOSIS — Z7951 Long term (current) use of inhaled steroids: Secondary | ICD-10-CM | POA: Insufficient documentation

## 2014-10-31 DIAGNOSIS — Z91040 Latex allergy status: Secondary | ICD-10-CM | POA: Insufficient documentation

## 2014-10-31 DIAGNOSIS — Z794 Long term (current) use of insulin: Secondary | ICD-10-CM | POA: Insufficient documentation

## 2014-10-31 DIAGNOSIS — J441 Chronic obstructive pulmonary disease with (acute) exacerbation: Secondary | ICD-10-CM | POA: Diagnosis not present

## 2014-10-31 DIAGNOSIS — R5381 Other malaise: Secondary | ICD-10-CM | POA: Diagnosis not present

## 2014-10-31 DIAGNOSIS — E1149 Type 2 diabetes mellitus with other diabetic neurological complication: Secondary | ICD-10-CM

## 2014-10-31 DIAGNOSIS — Z862 Personal history of diseases of the blood and blood-forming organs and certain disorders involving the immune mechanism: Secondary | ICD-10-CM | POA: Diagnosis not present

## 2014-10-31 DIAGNOSIS — R531 Weakness: Secondary | ICD-10-CM | POA: Insufficient documentation

## 2014-10-31 DIAGNOSIS — Z7982 Long term (current) use of aspirin: Secondary | ICD-10-CM | POA: Diagnosis not present

## 2014-10-31 DIAGNOSIS — J439 Emphysema, unspecified: Secondary | ICD-10-CM | POA: Diagnosis not present

## 2014-10-31 DIAGNOSIS — E1140 Type 2 diabetes mellitus with diabetic neuropathy, unspecified: Secondary | ICD-10-CM | POA: Insufficient documentation

## 2014-10-31 DIAGNOSIS — Z8742 Personal history of other diseases of the female genital tract: Secondary | ICD-10-CM | POA: Insufficient documentation

## 2014-10-31 DIAGNOSIS — G4730 Sleep apnea, unspecified: Secondary | ICD-10-CM | POA: Diagnosis not present

## 2014-10-31 DIAGNOSIS — R5383 Other fatigue: Secondary | ICD-10-CM | POA: Insufficient documentation

## 2014-10-31 DIAGNOSIS — R740 Nonspecific elevation of levels of transaminase and lactic acid dehydrogenase [LDH]: Secondary | ICD-10-CM

## 2014-10-31 DIAGNOSIS — R7401 Elevation of levels of liver transaminase levels: Secondary | ICD-10-CM

## 2014-10-31 DIAGNOSIS — R739 Hyperglycemia, unspecified: Secondary | ICD-10-CM | POA: Diagnosis not present

## 2014-10-31 DIAGNOSIS — J984 Other disorders of lung: Secondary | ICD-10-CM | POA: Diagnosis not present

## 2014-10-31 DIAGNOSIS — D860 Sarcoidosis of lung: Secondary | ICD-10-CM | POA: Diagnosis not present

## 2014-10-31 DIAGNOSIS — R7309 Other abnormal glucose: Secondary | ICD-10-CM | POA: Diagnosis not present

## 2014-10-31 LAB — COMPREHENSIVE METABOLIC PANEL
ALT: 121 U/L — ABNORMAL HIGH (ref 0–35)
AST: 52 U/L — ABNORMAL HIGH (ref 0–37)
Albumin: 3.6 g/dL (ref 3.5–5.2)
Alkaline Phosphatase: 144 U/L — ABNORMAL HIGH (ref 39–117)
Anion gap: 11 (ref 5–15)
BUN: 26 mg/dL — AB (ref 6–23)
CHLORIDE: 92 mmol/L — AB (ref 96–112)
CO2: 32 mmol/L (ref 19–32)
Calcium: 9.3 mg/dL (ref 8.4–10.5)
Creatinine, Ser: 1.19 mg/dL — ABNORMAL HIGH (ref 0.50–1.10)
GFR calc Af Amer: 62 mL/min — ABNORMAL LOW (ref >90)
GFR, EST NON AFRICAN AMERICAN: 53 mL/min — AB (ref >90)
Glucose, Bld: 307 mg/dL — ABNORMAL HIGH (ref 70–99)
Potassium: 4.3 mmol/L (ref 3.5–5.1)
Sodium: 135 mmol/L (ref 135–145)
Total Bilirubin: 0.8 mg/dL (ref 0.3–1.2)
Total Protein: 7 g/dL (ref 6.0–8.3)

## 2014-10-31 LAB — URINALYSIS, ROUTINE W REFLEX MICROSCOPIC
Bilirubin Urine: NEGATIVE
Glucose, UA: >1000 mg/dL — AB
HGB URINE DIPSTICK: NEGATIVE
Ketones, ur: NEGATIVE mg/dL
Leukocytes, UA: NEGATIVE
Nitrite: NEGATIVE
PH: 6 (ref 5.0–8.0)
PROTEIN: NEGATIVE mg/dL
Specific Gravity, Urine: 1.016 (ref 1.005–1.030)
UROBILINOGEN UA: 0.2 mg/dL (ref 0.0–1.0)

## 2014-10-31 LAB — CBG MONITORING, ED
Glucose-Capillary: 358 mg/dL — ABNORMAL HIGH (ref 70–99)
Glucose-Capillary: 181 mg/dL — ABNORMAL HIGH (ref 70–99)

## 2014-10-31 LAB — CBC WITH DIFFERENTIAL/PLATELET
Basophils Absolute: 0 10*3/uL (ref 0.0–0.1)
Basophils Relative: 0 % (ref 0–1)
EOS ABS: 0.1 10*3/uL (ref 0.0–0.7)
Eosinophils Relative: 1 % (ref 0–5)
HCT: 43.9 % (ref 36.0–46.0)
HEMOGLOBIN: 13.8 g/dL (ref 12.0–15.0)
LYMPHS ABS: 1 10*3/uL (ref 0.7–4.0)
LYMPHS PCT: 7 % — AB (ref 12–46)
MCH: 24.6 pg — ABNORMAL LOW (ref 26.0–34.0)
MCHC: 31.4 g/dL (ref 30.0–36.0)
MCV: 78.3 fL (ref 78.0–100.0)
Monocytes Absolute: 0.8 10*3/uL (ref 0.1–1.0)
Monocytes Relative: 6 % (ref 3–12)
NEUTROS PCT: 86 % — AB (ref 43–77)
Neutro Abs: 11.7 10*3/uL — ABNORMAL HIGH (ref 1.7–7.7)
Platelets: 222 10*3/uL (ref 150–400)
RBC: 5.61 MIL/uL — ABNORMAL HIGH (ref 3.87–5.11)
RDW: 17.3 % — ABNORMAL HIGH (ref 11.5–15.5)
WBC: 13.7 10*3/uL — AB (ref 4.0–10.5)

## 2014-10-31 LAB — URINE MICROSCOPIC-ADD ON

## 2014-10-31 MED ORDER — HYDROCODONE-ACETAMINOPHEN 5-325 MG PO TABS: 1.0000 | ORAL_TABLET | Freq: Four times a day (QID) | ORAL | Status: DC | PRN

## 2014-10-31 MED ORDER — FENTANYL CITRATE 0.05 MG/ML IJ SOLN
100.0000 ug | Freq: Once | INTRAMUSCULAR | Status: AC
  Administered 2014-10-31: 100 ug via INTRAVENOUS
  Filled 2014-10-31: qty 2

## 2014-10-31 MED ORDER — SODIUM CHLORIDE 0.9 % IV BOLUS (SEPSIS)
1000.0000 mL | Freq: Once | INTRAVENOUS | Status: AC
  Administered 2014-10-31: 1000 mL via INTRAVENOUS

## 2014-10-31 NOTE — ED Notes (Signed)
Pt from home via [facility] c/o hyperglycemia this am. CBG was 394 for [facility].  She reports taking 30 of Lantus and 6 of novolog prior to arrival.

## 2014-10-31 NOTE — Discharge Instructions (Signed)
Blood Glucose Monitoring °Monitoring your blood glucose (also know as blood sugar) helps you to manage your diabetes. It also helps you and your health care provider monitor your diabetes and determine how well your treatment plan is working. °WHY SHOULD YOU MONITOR YOUR BLOOD GLUCOSE? °· It can help you understand how food, exercise, and medicine affect your blood glucose. °· It allows you to know what your blood glucose is at any given moment. You can quickly tell if you are having low blood glucose (hypoglycemia) or high blood glucose (hyperglycemia). °· It can help you and your health care provider know how to adjust your medicines. °· It can help you understand how to manage an illness or adjust medicine for exercise. °WHEN SHOULD YOU TEST? °Your health care provider will help you decide how often you should check your blood glucose. This may depend on the type of diabetes you have, your diabetes control, or the types of medicines you are taking. Be sure to write down all of your blood glucose readings so that this information can be reviewed with your health care provider. See below for examples of testing times that your health care provider may suggest. °Type 1 Diabetes °· Test 4 times a day if you are in good control, using an insulin pump, or perform multiple daily injections. °· If your diabetes is not well controlled or if you are sick, you may need to monitor more often. °· It is a good idea to also monitor: °· Before and after exercise. °· Between meals and 2 hours after a meal. °· Occasionally between 2:00 a.m. and 3:00 a.m. °Type 2 Diabetes °· It can vary with each person, but generally, if you are on insulin, test 4 times a day. °· If you take medicines by mouth (orally), test 2 times a day. °· If you are on a controlled diet, test once a day. °· If your diabetes is not well controlled or if you are sick, you may need to monitor more often. °HOW TO MONITOR YOUR BLOOD GLUCOSE °Supplies  Needed °· Blood glucose meter. °· Test strips for your meter. Each meter has its own strips. You must use the strips that go with your own meter. °· A pricking needle (lancet). °· A device that holds the lancet (lancing device). °· A journal or log book to write down your results. °Procedure °· Wash your hands with soap and water. Alcohol is not preferred. °· Prick the side of your finger (not the tip) with the lancet. °· Gently milk the finger until a small drop of blood appears. °· Follow the instructions that come with your meter for inserting the test strip, applying blood to the strip, and using your blood glucose meter. °Other Areas to Get Blood for Testing °Some meters allow you to use other areas of your body (other than your finger) to test your blood. These areas are called alternative sites. The most common alternative sites are: °· The forearm. °· The thigh. °· The back area of the lower leg. °· The palm of the hand. °The blood flow in these areas is slower. Therefore, the blood glucose values you get may be delayed, and the numbers are different from what you would get from your fingers. Do not use alternative sites if you think you are having hypoglycemia. Your reading will not be accurate. Always use a finger if you are having hypoglycemia. Also, if you cannot feel your lows (hypoglycemia unawareness), always use your fingers for your   blood glucose checks. °ADDITIONAL TIPS FOR GLUCOSE MONITORING °· Do not reuse lancets. °· Always carry your supplies with you. °· All blood glucose meters have a 24-hour "hotline" number to call if you have questions or need help. °· Adjust (calibrate) your blood glucose meter with a control solution after finishing a few boxes of strips. °BLOOD GLUCOSE RECORD KEEPING °It is a good idea to keep a daily record or log of your blood glucose readings. Most glucose meters, if not all, keep your glucose records stored in the meter. Some meters come with the ability to download  your records to your home computer. Keeping a record of your blood glucose readings is especially helpful if you are wanting to look for patterns. Make notes to go along with the blood glucose readings because you might forget what happened at that exact time. Keeping good records helps you and your health care provider to work together to achieve good diabetes management.  °Document Released: 09/14/2003 Document Revised: 01/26/2014 Document Reviewed: 02/03/2013 °ExitCare® Patient Information ©2015 [employer]. This information is not intended to replace advice given to you by your health care provider. Make sure you discuss any questions you have with your health care provider. °Hyperglycemia °Hyperglycemia occurs when the glucose (sugar) in your blood is too high. Hyperglycemia can happen for many reasons, but it most often happens to people who do not know they have diabetes or are not managing their diabetes properly.  °CAUSES  °Whether you have diabetes or not, there are other causes of hyperglycemia. Hyperglycemia can occur when you have diabetes, but it can also occur in other situations that you might not be as aware of, such as: °Diabetes °· If you have diabetes and are having problems controlling your blood glucose, hyperglycemia could occur because of some of the following reasons: °¨ Not following your meal plan. °¨ Not taking your diabetes medications or not taking it properly. °¨ Exercising less or doing less activity than you normally do. °¨ Being sick. °Pre-diabetes °· This cannot be ignored. Before people develop Type 2 diabetes, they almost always have "pre-diabetes." This is when your blood glucose levels are higher than normal, but not yet high enough to be diagnosed as diabetes. Research has shown that some long-term damage to the body, especially the heart and circulatory system, may already be occurring during pre-diabetes. If you take action to manage your blood glucose when you have  pre-diabetes, you may delay or prevent Type 2 diabetes from developing. °Stress °· If you have diabetes, you may be "diet" controlled or on oral medications or insulin to control your diabetes. However, you may find that your blood glucose is higher than usual in the hospital whether you have diabetes or not. This is often referred to as "stress hyperglycemia." Stress can elevate your blood glucose. This happens because of hormones put out by the body during times of stress. If stress has been the cause of your high blood glucose, it can be followed regularly by your caregiver. That way he/she can make sure your hyperglycemia does not continue to get worse or progress to diabetes. °Steroids °· Steroids are medications that act on the infection fighting system (immune system) to block inflammation or infection. One side effect can be a rise in blood glucose. Most people can produce enough extra insulin to allow for this rise, but for those who cannot, steroids make blood glucose levels go even higher. It is not unusual for steroid treatments to "uncover" diabetes that   is developing. It is not always possible to determine if the hyperglycemia will go away after the steroids are stopped. A special blood test called an A1c is sometimes done to determine if your blood glucose was elevated before the steroids were started. °SYMPTOMS °· Thirsty. °· Frequent urination. °· Dry mouth. °· Blurred vision. °· Tired or fatigue. °· Weakness. °· Sleepy. °· Tingling in feet or leg. °DIAGNOSIS  °Diagnosis is made by monitoring blood glucose in one or all of the following ways: °· A1c test. This is a chemical found in your blood. °· Fingerstick blood glucose monitoring. °· Laboratory results. °TREATMENT  °First, knowing the cause of the hyperglycemia is important before the hyperglycemia can be treated. Treatment may include, but is not be limited to: °· Education. °· Change or adjustment in medications. °· Change or adjustment in  meal plan. °· Treatment for an illness, infection, etc. °· More frequent blood glucose monitoring. °· Change in exercise plan. °· Decreasing or stopping steroids. °· Lifestyle changes. °HOME CARE INSTRUCTIONS  °· Test your blood glucose as directed. °· Exercise regularly. Your caregiver will give you instructions about exercise. Pre-diabetes or diabetes which comes on with stress is helped by exercising. °· Eat wholesome, balanced meals. Eat often and at regular, fixed times. Your caregiver or nutritionist will give you a meal plan to guide your sugar intake. °· Being at an ideal weight is important. If needed, losing as little as 10 to 15 pounds may help improve blood glucose levels. °SEEK MEDICAL CARE IF:  °· You have questions about medicine, activity, or diet. °· You continue to have symptoms (problems such as increased thirst, urination, or weight gain). °SEEK IMMEDIATE MEDICAL CARE IF:  °· You are vomiting or have diarrhea. °· Your breath smells fruity. °· You are breathing faster or slower. °· You are very sleepy or incoherent. °· You have numbness, tingling, or pain in your feet or hands. °· You have chest pain. °· Your symptoms get worse even though you have been following your caregiver's orders. °· If you have any other questions or concerns. °Document Released: 03/07/2001 Document Revised: 12/04/2011 Document Reviewed: 01/08/2012 °ExitCare® Patient Information ©2015 [employer]. This information is not intended to replace advice given to you by your health care provider. Make sure you discuss any questions you have with your health care provider. ° °

## 2014-10-31 NOTE — ED Notes (Signed)
Pt in X ray

## 2014-10-31 NOTE — ED Notes (Signed)
Bed: WA09 Expected date:  Expected time:  Means of arrival:  Comments: EMS hyperglycemia 

## 2014-10-31 NOTE — ED Notes (Signed)
Pt requesting pain medication.  

## 2014-10-31 NOTE — ED Notes (Signed)
PA made aware of request for pain medication.

## 2014-10-31 NOTE — ED Notes (Signed)
Awake. Verbally responsive. A/O x4. Resp even and unlabored. No audible adventitious breath sounds noted. Auscultated clear breath sounds bil and throughout with sats at 96% with O2 at 3lpm via [place]. Pt ambulated to BR and back to room with steady gait. ABC's intact.

## 2014-10-31 NOTE — ED Notes (Signed)
PT MADE AWARE OF THE NEED FOR A URINE SAMPLE. SHE REPORTS SHE IS UNABLE TO VOID AT THIS TIME.

## 2014-10-31 NOTE — ED Notes (Signed)
PA at bedside.

## 2014-10-31 NOTE — ED Provider Notes (Signed)
CSN: [id number]     Arrival date & time 10/31/14  1608 History   First MD Initiated Contact with Patient 10/31/14 1615     Chief Complaint  Patient presents with  . Hyperglycemia   HPI  Patient's 49 year old female with past medical history of morbid obesity, hypertension, hyperlipidemia, fibromyalgia, congestive heart failure with an EF of 60%, COPD with home oxygen and BiPAP use at night, and diabetes who presents to the emergency room for evaluation of hyperglycemia and malaise. Patient states that for the past several days since being discharged from the hospital she has been very fatigued and having generalized malaise. She states that this morning she woke up and she is having terrible pain in her legs, she thought that this pain was consistent with her neuropathy pain. She also noticed that she was urinating frequently and was very thirsty and had a dry mouth. She checked her blood sugar and noticed that she had a blood sugar in the 400s. She states the last time that she had blood sugars this high she was passing out she called EMS who brought her to the emergency room. Patient was recently admitted on 10/18/2014 for a COPD exacerbation. She was discharged home on Augmentin and prednisone.Patient states that she is on her last dose of the prednisone today. She states she has been taking her antibiotics as prescribed. She also notes that she has been taking her insulin as usual.  She also notes that she has been having very foul smelling gas and mild abdominal pain. She notes 3 episodes of diarrhea yesterday and one episode today.   Past Medical History  Diagnosis Date  . Hypertension   . Hyperlipidemia   . Chronic headache   . Fibromyalgia     daily narcotics  . Anxiety     hx chronic BZ use, stopped 07/2010  . Anemia   . Pulmonary sarcoidosis     unimpressive CT chest 2011  . Colonic polyp   . GERD (gastroesophageal reflux disease)   . ALLERGIC RHINITIS   . Asthma   . CHF  (congestive heart failure)     Diastolic with fluid overload, May, 2012, LVEF 60%  . Morbid obesity   . Depression   . Panic attacks   . Diabetes mellitus   . COPD (chronic obstructive pulmonary disease)     on home O2, moderate airflow obstruction, suspect d/t emphysema  . Obesity   . Elevated LFTs 09/2011  . Ovarian cyst   . Chronic back pain   . Sleep apnea      CPAP  . Fatty liver   . Esophagitis   . Gastritis   . Internal hemorrhoids   . Adenomatous colon polyp 12/03/09    [facility] in [place], [place]   Past Surgical History  Procedure Laterality Date  . Polypectomy  2011  . Lumbar microdiscectomy  07/06/2011    R L4-5, stern  . Back surgery    . Carpal tunnel release    . Steroid spinal injections    . Hysteroscopy w/d&c N/A 03/25/2013    Procedure: DILATATION AND CURETTAGE /HYSTEROSCOPY;  Surgeon: [name], MD;  Location: [facility];  Service: Gynecology;  Laterality: N/A;  . Dilation and curettage of uterus     Family History  Problem Relation Age of Onset  . Hypertension Mother   . Emphysema Father   . Hypertension Father   . Stomach cancer Father   . Allergies Brother   . Hypertension Brother   .  Stomach cancer Brother   . Heart disease Mother   . Heart disease Father   . Heart disease Brother     died age 59 sudden death/MI   History  Substance Use Topics  . Smoking status: Former Smoker -- 0.25 packs/day for 29 years    Types: Cigarettes    Quit date: 08/09/2014  . Smokeless tobacco: Never Used     Comment: smoking e-cigs daily  . Alcohol Use: 0.0 oz/week    0 Not specified per week     Comment: occasionally   OB History    No data available     Review of Systems  Constitutional: Positive for activity change and fatigue. Negative for fever, chills and diaphoresis.  Respiratory: Positive for cough and shortness of breath. Negative for chest tightness and wheezing.   Cardiovascular: Negative for chest pain and palpitations.  Endocrine:  Positive for polydipsia and polyuria.  Genitourinary: Positive for frequency. Negative for dysuria, urgency, hematuria, difficulty urinating and pelvic pain.  Neurological: Positive for weakness and numbness. Negative for dizziness, syncope and headaches.  All other systems reviewed and are negative.     Allergies  Simvastatin; Sulfamethoxazole-trimethoprim; Zolpidem tartrate; Azithromycin; Doxycycline; Latex; Metformin and related; Metronidazole; Metolazone; and Ciprofloxacin  Home Medications   Prior to Admission medications   Medication Sig Start Date End Date Taking? Authorizing Provider  acidophilus (RISAQUAD) CAPS capsule Take 1 capsule by mouth daily. 10/24/14  Yes Belkys A Regalado, MD  albuterol (PROVENTIL) (2.5 MG/3ML) 0.083% nebulizer solution INHALE THE CONTENTS OF 1 VIAL VIA NEBULIZER EVERY 6 HOURS AS NEEDED FOR WHEEZING OR SHORTNESS OF BREATH 10/13/14  Yes Rowe Clack, MD  ALPRAZolam Duanne Moron) 1 MG tablet Take 1 mg by mouth 3 (three) times daily.   Yes Historical Provider, MD  aspirin EC 81 MG tablet Take 81 mg by mouth daily.   Yes Historical Provider, MD  BIOTIN PO Take 1 capsule by mouth daily.   Yes Historical Provider, MD  cetirizine (ZYRTEC) 1 MG/ML syrup TAKE 10 ML BY MOUTH EVERY NIGHT AT BEDTIME 09/07/14  Yes Rowe Clack, MD  Coenzyme Q10 (CO Q 10) 10 MG CAPS Take 1 capsule by mouth daily.   Yes Historical Provider, MD  dextromethorphan-guaiFENesin (MUCINEX DM) 30-600 MG per 12 hr tablet Take 1 tablet by mouth 2 (two) times daily. 10/24/14  Yes Belkys A Regalado, MD  esomeprazole (NEXIUM) 40 MG capsule Take 40 mg by mouth 2 (two) times daily before a meal.   Yes Historical Provider, MD  fluticasone (FLONASE) 50 MCG/ACT nasal spray Place 2 sprays into both nostrils daily. 10/19/14  Yes Rowe Clack, MD  gabapentin (NEURONTIN) 300 MG capsule Take 300 mg by mouth at bedtime.   Yes Historical Provider, MD  glimepiride (AMARYL) 1 MG tablet Take 1 mg by mouth  daily with breakfast.   Yes Historical Provider, MD  HYDROcodone-acetaminophen (NORCO) 10-325 MG per tablet Take 1 tablet by mouth every 6 (six) hours as needed for moderate pain.   Yes Historical Provider, MD  hydrOXYzine (ATARAX/VISTARIL) 10 MG tablet TAKE 1 TABLET BY MOUTH THREE TIMES DAILY AS NEEDED FOR ITCHING 08/27/14  Yes Biagio Borg, MD  ibuprofen (ADVIL,MOTRIN) 200 MG tablet Take 800 mg by mouth every 6 (six) hours as needed for moderate pain.   Yes Historical Provider, MD  insulin aspart (NOVOLOG FLEXPEN) 100 UNIT/ML FlexPen Use as directed per sliding scale indicated by your physician. 09/22/14  Yes Rowe Clack, MD  Insulin Glargine (LANTUS  SOLOSTAR) 100 UNIT/ML Solostar Pen Inject 23 Units into the skin at bedtime. Patient taking differently: Inject 30 Units into the skin 2 (two) times daily.  10/24/14  Yes Belkys A Regalado, MD  Insulin Pen Needle 31G X 8 MM MISC Use to administer insulin (Solostar Pen). 09/28/14  Yes Rowe Clack, MD  Liraglutide (VICTOZA) 18 MG/3ML SOPN Inject 0.6 mg into the skin daily.   Yes Historical Provider, MD  Multiple Vitamin (MULTIVITAMIN) capsule Take 1 capsule by mouth daily.   Yes Historical Provider, MD  naproxen sodium (ANAPROX) 220 MG tablet Take 440 mg by mouth 2 (two) times daily as needed (pain).   Yes Historical Provider, MD  omega-3 fish oil (MAXEPA) 1000 MG CAPS capsule Take 1 capsule by mouth daily.   Yes Historical Provider, MD  Oxycodone HCl 10 MG TABS Take 1 tablet (10 mg total) by mouth 3 (three) times daily as needed (severe pain). 09/28/14  Yes Rowe Clack, MD  potassium chloride SA (K-DUR,KLOR-CON) 20 MEQ tablet Take 1 tablet (20 mEq total) by mouth daily. Patient taking differently: Take 40 mEq by mouth 2 (two) times daily.  10/24/14  Yes Belkys A Regalado, MD  predniSONE (DELTASONE) 20 MG tablet Take 3 tablets for 3 days then 2 tablets for 3 days then 1 tablet for 2 days then stop. 10/25/14  Yes Belkys A Regalado, MD  PROAIR  HFA 108 (90 BASE) MCG/ACT inhaler INHALE 2 PUFFS BY MOUTH EVERY 6 HOURS AS NEEDED 08/28/14  Yes Rowe Clack, MD  sertraline (ZOLOFT) 100 MG tablet Take 100 mg by mouth daily.    Yes Historical Provider, MD  SPIRIVA HANDIHALER 18 MCG inhalation capsule INHALE CONTENTS OF 1 CAPSULE USING HANDIHALER EVERY DAY 08/24/14  Yes Kathee Delton, MD  SYMBICORT 160-4.5 MCG/ACT inhaler INHALE 2 PUFFS BY MOUTH TWICE DAILY 10/19/14  Yes Kathee Delton, MD  tiZANidine (ZANAFLEX) 4 MG tablet Take 1 tablet (4 mg total) by mouth every 8 (eight) hours as needed for muscle spasms. 07/30/14  Yes Biagio Borg, MD  torsemide (DEMADEX) 100 MG tablet Take 1 tablet (100 mg total) by mouth 2 (two) times daily. 07/30/14  Yes Biagio Borg, MD  amoxicillin-clavulanate (AUGMENTIN) 875-125 MG per tablet Take 1 tablet by mouth every 12 (twelve) hours. 10/24/14   Belkys A Regalado, MD   BP 143/75 mmHg  Pulse 81  Temp(Src) 98.3 F (36.8 C) (Oral)  Resp 22  SpO2 96%  LMP 10/16/2012 Physical Exam  Constitutional: She is oriented to person, place, and time. She appears well-developed and well-nourished. No distress.  HENT:  Head: Normocephalic and atraumatic.  Mouth/Throat: No oropharyngeal exudate.  Dry pink mucous membranes  Eyes: Conjunctivae and EOM are normal. Pupils are equal, round, and reactive to light. No scleral icterus.  Neck: Normal range of motion. Neck supple. No JVD present. No thyromegaly present.  Cardiovascular: Normal rate and regular rhythm.   Distant S1 and S2 with no appreciable murmurs, rubs, or gallops.  Pulmonary/Chest:  Distant breath sounds bilaterally with no appreciable wheezing, rhonchi, or rales.  Abdominal: Soft. Bowel sounds are normal. She exhibits no distension and no mass. There is no tenderness. There is no rebound and no guarding.  Musculoskeletal: Normal range of motion.  Lymphadenopathy:    She has no cervical adenopathy.  Neurological: She is alert and oriented to person, place,  and time.  Skin: Skin is warm and dry. She is not diaphoretic.  Psychiatric: She has a normal mood  and affect. Her behavior is normal. Judgment and thought content normal.  Nursing note and vitals reviewed.   ED Course  Procedures (including critical care time) Labs Review Labs Reviewed  CBC WITH DIFFERENTIAL/PLATELET - Abnormal; Notable for the following:    WBC 13.7 (*)    RBC 5.61 (*)    MCH 24.6 (*)    RDW 17.3 (*)    Neutrophils Relative % 86 (*)    Neutro Abs 11.7 (*)    Lymphocytes Relative 7 (*)    All other components within normal limits  COMPREHENSIVE METABOLIC PANEL - Abnormal; Notable for the following:    Chloride 92 (*)    Glucose, Bld 307 (*)    BUN 26 (*)    Creatinine, Ser 1.19 (*)    AST 52 (*)    ALT 121 (*)    Alkaline Phosphatase 144 (*)    GFR calc non Af Amer 53 (*)    GFR calc Af Amer 62 (*)    All other components within normal limits  URINALYSIS, ROUTINE W REFLEX MICROSCOPIC - Abnormal; Notable for the following:    Glucose, UA >1000 (*)    All other components within normal limits  CBG MONITORING, ED - Abnormal; Notable for the following:    Glucose-Capillary 358 (*)    All other components within normal limits  CBG MONITORING, ED - Abnormal; Notable for the following:    Glucose-Capillary 181 (*)    All other components within normal limits  URINE MICROSCOPIC-ADD ON  CBG MONITORING, ED    Imaging Review Dg Chest 2 View  10/31/2014   CLINICAL DATA:  Chest pain.  History of sarcoidosis.  EXAM: CHEST  2 VIEW  COMPARISON:  Chest x-ray 10/23/2014 and chest CT 01/19/2014  FINDINGS: The cardiac silhouette, mediastinal and hilar contours are stable. Stable mediastinal and hilar adenopathy. The lungs demonstrate emphysematous changes and pulmonary scarring consistent with emphysema and sarcoidosis. No acute overlying pulmonary process. No pleural effusion. The bony thorax is intact.  IMPRESSION: Chronic lung changes but no acute pulmonary findings.    Electronically Signed   By: Kalman Jewels M.D.   On: 10/31/2014 17:19     EKG Interpretation None      MDM   Final diagnoses:  Malaise  Other diabetic neurological complication associated with type 2 diabetes mellitus  Hyperglycemia  Transaminitis   Patient is a 48 year old female who presents emergency room for evaluation of hyperglycemia. Patient recently treated with prednisone therapy. She has been having generalized malaise. Patient states she has been taking her insulin as prescribed at home. Patient also complaining of bilateral foot pain consistent with her neuropathy pain. Patient found to have a blood glucose of 307 on arrival today. She does have a mild leukocytosis. There is no anion gap. Patient has no ketones in her urine. Doubt this is DKA. Suspect this is likely secondary to prednisone use. Patient given 1 L normal saline bolus here with no insulin and sugar decreased to 180. Patient does have new transaminitis that is mild. Her belly is soft and nontender on palpation. We'll have her recheck LFTs on Tuesday at her office appointment. Patient to return for intractable abdominal pain, intractable nausea and vomiting, or any other concerning symptoms. She states understanding and agreement at this time. We'll discharge home with 2 days of pain medication for her neuropathy. Patient discussed with Dr. Tamera Punt who agrees the above workup and plan. Patient stable for discharge at this time.  Loma Sousa A  Forcucci, PA-C 10/31/14 2141  Malvin Johns, MD 10/31/14 2307

## 2014-10-31 NOTE — ED Notes (Addendum)
Unsuccessful attempt at IV insertion. Will have another RN to attempt.

## 2014-10-31 NOTE — ED Notes (Signed)
Pt walked without assistance, with O2 tank in tow

## 2014-11-01 DIAGNOSIS — M542 Cervicalgia: Secondary | ICD-10-CM | POA: Diagnosis not present

## 2014-11-02 DIAGNOSIS — Q762 Congenital spondylolisthesis: Secondary | ICD-10-CM | POA: Diagnosis not present

## 2014-11-02 DIAGNOSIS — G894 Chronic pain syndrome: Secondary | ICD-10-CM | POA: Diagnosis not present

## 2014-11-02 DIAGNOSIS — M545 Low back pain: Secondary | ICD-10-CM | POA: Diagnosis not present

## 2014-11-02 DIAGNOSIS — Z79899 Other long term (current) drug therapy: Secondary | ICD-10-CM | POA: Diagnosis not present

## 2014-11-02 DIAGNOSIS — Z79891 Long term (current) use of opiate analgesic: Secondary | ICD-10-CM | POA: Diagnosis not present

## 2014-11-02 DIAGNOSIS — Z6841 Body Mass Index (BMI) 40.0 and over, adult: Secondary | ICD-10-CM | POA: Diagnosis not present

## 2014-11-03 ENCOUNTER — Ambulatory Visit (INDEPENDENT_AMBULATORY_CARE_PROVIDER_SITE_OTHER): Payer: Medicare Other | Admitting: Family

## 2014-11-03 ENCOUNTER — Encounter: Payer: Self-pay | Admitting: Family

## 2014-11-03 VITALS — BP 140/88 | HR 109 | Temp 98.7°F | Resp 20 | Wt 304.1 lb

## 2014-11-03 DIAGNOSIS — L03818 Cellulitis of other sites: Secondary | ICD-10-CM

## 2014-11-03 DIAGNOSIS — L0390 Cellulitis, unspecified: Secondary | ICD-10-CM | POA: Insufficient documentation

## 2014-11-03 DIAGNOSIS — J441 Chronic obstructive pulmonary disease with (acute) exacerbation: Secondary | ICD-10-CM | POA: Diagnosis not present

## 2014-11-03 MED ORDER — FLUCONAZOLE 150 MG PO TABS: 150.0000 mg | ORAL_TABLET | Freq: Once | ORAL | Status: DC

## 2014-11-03 MED ORDER — HYDROCODONE-HOMATROPINE 5-1.5 MG/5ML PO SYRP
5.0000 mL | ORAL_SOLUTION | Freq: Three times a day (TID) | ORAL | Status: DC | PRN
Start: 2014-11-03 — End: 2014-11-18

## 2014-11-03 MED ORDER — CEFUROXIME AXETIL 250 MG PO TABS: 250.0000 mg | ORAL_TABLET | Freq: Two times a day (BID) | ORAL | Status: DC

## 2014-11-03 MED ORDER — VARENICLINE TARTRATE 0.5 MG PO TABS
0.5000 mg | ORAL_TABLET | Freq: Two times a day (BID) | ORAL | Status: DC
Start: 2014-11-03 — End: 2015-02-08

## 2014-11-03 NOTE — Assessment & Plan Note (Signed)
Patient continues to experience mild short of breath while on oxygen. Her lung fields are clear. Low suspicion for continued COPD exacerbation. Continue current albuterol, Symbicort, Spiriva, and furosemide at current dosages. Follow-up with pulmonology as needed.

## 2014-11-03 NOTE — Assessment & Plan Note (Deleted)
Patient continues to experience mild short of breath while on oxygen. Her lung fields are clear. Low suspicion for continued COPD exacerbation. Continue current albuterol, Symbicort, Spiriva, and furosemide at current dosages. Follow-up with pulmonology as needed.

## 2014-11-03 NOTE — Assessment & Plan Note (Signed)
Symptoms and exam consistent with possible cellulitis. Start Ceftin. Follow up if symptoms worsen or fail to improve.

## 2014-11-03 NOTE — Progress Notes (Signed)
Subjective:    Patient ID: RAEDYN KLINCK, female    DOB: Jul 22, 1966, 49 y.o.   MRN: [id number]  Chief Complaint  Patient presents with  . Hospitalization Follow-up    has a spot on her left leg that she says is like a knot, x2 weeks, says she feels better from when she was in the hospital, still has cough    HPI:  ARELI FRARY is a 49 y.o. female with multiple medical conditions who presents today for a hospital follow up.    1) She was recently admitted to the hospital for a COPD exacerbation for which she was treated with prednisone, oxygen and antibiotics. Chest x-rays showed mild pulmonary congestion at that time. Her weight was down from 136 kg 231 kg at discharge. Her spironolactone was discontinued at that time and her renal function was stable. All hospital records were reviewed in detail.  Continues to experience the associated symptoms of coughing up sputum and shortness of breath. Describes the sputum as yellowish and brown in color. Modifying factors include her COPD medications which she resumed upon discharge. Indicates she has taken a breathing treatment prior to arriving for this appointment.   2) Knots - this is a new problem. Associated symptom of knots or nodules on her left tibia and on her left wrist have been there for approximately a couple weeks. Denies any modifying factors for treatment. Indicates the spot on her shin is painful and reddened. She is unable to describe the pain.   Allergies  Allergen Reactions  . Simvastatin Other (See Comments)    Severe leg pain and burning  . Sulfamethoxazole-Trimethoprim Nausea And Vomiting    Causes Projectile Vomiting  . Zolpidem Tartrate Other (See Comments)    Chest pain  . Azithromycin Other (See Comments)    Resistant to med  . Doxycycline Other (See Comments)    Resistant to med  . Latex Itching and Rash    Also burning sensations  . Metformin And Related Diarrhea    Severe diarrhea  . Metronidazole  Nausea And Vomiting  . Metolazone Other (See Comments)    Cramps,pain  . Ciprofloxacin Nausea Only    Current Outpatient Prescriptions on File Prior to Visit  Medication Sig Dispense Refill  . acidophilus (RISAQUAD) CAPS capsule Take 1 capsule by mouth daily. 30 capsule 0  . albuterol (PROVENTIL) (2.5 MG/3ML) 0.083% nebulizer solution INHALE THE CONTENTS OF 1 VIAL VIA NEBULIZER EVERY 6 HOURS AS NEEDED FOR WHEEZING OR SHORTNESS OF BREATH 75 mL 0  . ALPRAZolam (XANAX) 1 MG tablet Take 1 mg by mouth 3 (three) times daily.    Marland Kitchen aspirin EC 81 MG tablet Take 81 mg by mouth daily.    Marland Kitchen BIOTIN PO Take 1 capsule by mouth daily.    . cetirizine (ZYRTEC) 1 MG/ML syrup TAKE 10 ML BY MOUTH EVERY NIGHT AT BEDTIME 300 mL 0  . Coenzyme Q10 (CO Q 10) 10 MG CAPS Take 1 capsule by mouth daily.    Marland Kitchen esomeprazole (NEXIUM) 40 MG capsule Take 40 mg by mouth 2 (two) times daily before a meal.    . fluticasone (FLONASE) 50 MCG/ACT nasal spray Place 2 sprays into both nostrils daily. 16 g 5  . gabapentin (NEURONTIN) 300 MG capsule Take 300 mg by mouth at bedtime.    Marland Kitchen glimepiride (AMARYL) 1 MG tablet Take 1 mg by mouth daily with breakfast.    . HYDROcodone-acetaminophen (NORCO/VICODIN) 5-325 MG per tablet Take 1-2 tablets  by mouth every 6 (six) hours as needed for moderate pain or severe pain. 10 tablet 0  . hydrOXYzine (ATARAX/VISTARIL) 10 MG tablet TAKE 1 TABLET BY MOUTH THREE TIMES DAILY AS NEEDED FOR ITCHING 30 tablet 11  . ibuprofen (ADVIL,MOTRIN) 200 MG tablet Take 800 mg by mouth every 6 (six) hours as needed for moderate pain.    Marland Kitchen insulin aspart (NOVOLOG FLEXPEN) 100 UNIT/ML FlexPen Use as directed per sliding scale indicated by your physician. 21 mL 5  . Insulin Glargine (LANTUS SOLOSTAR) 100 UNIT/ML Solostar Pen Inject 23 Units into the skin at bedtime. (Patient taking differently: Inject 30 Units into the skin 2 (two) times daily. ) 31 mL 5  . Insulin Pen Needle 31G X 8 MM MISC Use to administer insulin  (Solostar Pen). 100 each 12  . Liraglutide (VICTOZA) 18 MG/3ML SOPN Inject 0.6 mg into the skin daily.    . Multiple Vitamin (MULTIVITAMIN) capsule Take 1 capsule by mouth daily.    . naproxen sodium (ANAPROX) 220 MG tablet Take 440 mg by mouth 2 (two) times daily as needed (pain).    Marland Kitchen omega-3 fish oil (MAXEPA) 1000 MG CAPS capsule Take 1 capsule by mouth daily.    . Oxycodone HCl 10 MG TABS Take 1 tablet (10 mg total) by mouth 3 (three) times daily as needed (severe pain). 90 tablet 0  . potassium chloride SA (K-DUR,KLOR-CON) 20 MEQ tablet Take 1 tablet (20 mEq total) by mouth daily. (Patient taking differently: Take 40 mEq by mouth 2 (two) times daily. ) 180 tablet 1  . predniSONE (DELTASONE) 20 MG tablet Take 3 tablets for 3 days then 2 tablets for 3 days then 1 tablet for 2 days then stop. 17 tablet 0  . PROAIR HFA 108 (90 BASE) MCG/ACT inhaler INHALE 2 PUFFS BY MOUTH EVERY 6 HOURS AS NEEDED 8.5 g 1  . sertraline (ZOLOFT) 100 MG tablet Take 100 mg by mouth daily.     Marland Kitchen SPIRIVA HANDIHALER 18 MCG inhalation capsule INHALE CONTENTS OF 1 CAPSULE USING HANDIHALER EVERY DAY 30 capsule 3  . SYMBICORT 160-4.5 MCG/ACT inhaler INHALE 2 PUFFS BY MOUTH TWICE DAILY 10.2 g 3  . tiZANidine (ZANAFLEX) 4 MG tablet Take 1 tablet (4 mg total) by mouth every 8 (eight) hours as needed for muscle spasms. 30 tablet 2  . torsemide (DEMADEX) 100 MG tablet Take 1 tablet (100 mg total) by mouth 2 (two) times daily. 60 tablet 1   No current facility-administered medications on file prior to visit.    Review of Systems  Constitutional: Negative for fever and chills.  Respiratory: Positive for cough, shortness of breath and wheezing.   Skin: Positive for rash.      Objective:    BP 140/88 mmHg  Pulse 109  Temp(Src) 98.7 F (37.1 C) (Oral)  Resp 20  Wt 304 lb 1.9 oz (137.948 kg)  SpO2 91%  LMP 10/16/2012 Nursing note and vital signs reviewed.  Physical Exam  Constitutional: She is oriented to person,  place, and time. She appears well-developed and well-nourished. No distress.  Cardiovascular: Normal rate, regular rhythm, normal heart sounds and intact distal pulses.   Pulmonary/Chest: Effort normal and breath sounds normal. She has no wheezes.  Neurological: She is alert and oriented to person, place, and time.  Skin: Skin is warm and dry.  Left tibia: About quarter sized circular reddened area with tenderness noted on anterior tibia. No streaks present. Noticeably warmer to the touch.  Psychiatric: She has  a normal mood and affect. Her behavior is normal. Judgment and thought content normal.       Assessment & Plan:

## 2014-11-03 NOTE — Patient Instructions (Signed)
Thank you for choosing [facility].  Summary/Instructions:  Your prescription(s) have been submitted to your pharmacy or been printed and provided for you. Please take as directed and contact our office if you believe you are having problem(s) with the medication(s) or have any questions.  Please stop by the lab on the basement level of the building for your blood work. Your results will be released to MyChart (or called to you) after review, usually within 72 hours after test completion. If any changes need to be made, you will be notified at that same time.  If your symptoms worsen or fail to improve, please contact our office for further instruction, or in case of emergency go directly to the emergency room at the closest medical facility.     

## 2014-11-03 NOTE — Progress Notes (Signed)
Pre visit review using our clinic review tool, if applicable. No additional management support is needed unless otherwise documented below in the visit note. 

## 2014-11-04 ENCOUNTER — Telehealth: Payer: Self-pay | Admitting: Internal Medicine
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
## 2014-11-04 NOTE — Telephone Encounter (Signed)
There is a phone note about this happening before.

## 2014-11-08 ENCOUNTER — Other Ambulatory Visit: Payer: Self-pay | Admitting: Internal Medicine

## 2014-11-09 NOTE — Telephone Encounter (Signed)
[facility]  Patient Name: [name]  DOB: 10-Feb-1966    Initial Comment Caller States she has high blood sugar and was seen in the office and put on antibiotics and was told that she could get false readings. only symptom his that she is thirsty, and hard to wake up not wanting to get out of bed. when she takes her readings she is not sure what it really is.    Nurse Assessment      Guidelines    Guideline Title Affirmed Question Affirmed Notes       Final Disposition User   Clinical Call Harlow Mares, RN, [place]    Comments  Caller States she has high blood sugar and was seen in the office and put on antibiotics and was told that she could get false readings. only symptom his that she is thirsty, and hard to wake up not wanting to get out of bed. when she takes her readings she is not sure what it really is. BS is 368, 324, 313. Reports that she is being treated for cellulitis in her legs. She has been running high. Reports that she takes Lantis 30U and Humalog is sliding scale. (usually uses 10U of Lantis). Last BS was 324. Reports that she is ok now and she is feeling better. Denied triage. Advised to continue to keep BS and log for further use. Caller denies current symptoms and states that she will call back if she gets to feeling bad or her BS continues to rise.

## 2014-11-11 ENCOUNTER — Telehealth: Payer: Self-pay | Admitting: *Deleted

## 2014-11-11 NOTE — Telephone Encounter (Signed)
[facility] Client [facility] Patient Name: [name] Gender: Female DOB: 09-20-1966 Age: 49 Y 5 M 3 D Return Phone Number: [phone] (Primary) Address: [address] City/State/Zip: [place] [address] Client [facility] - Client Client Site [facility] Day Physician Gwendolyn Grant Contact Type Call Call Type Triage / Clinical Relationship To Patient Self Appointment Disposition EMR Appointment Not Necessary Return Phone Number [phone] (Primary) Chief Complaint Blood Sugar High Initial Comment Caller States she has high blood sugar and was seen in the office and put on antibiotics and was told that she could get false readings. only symptom his that she is thirsty, and hard to wake up not wanting to get out of bed. when she takes her readings she is not sure what it really is. Info pasted into Epic Yes Nurse Assessment Guidelines Guideline Title Affirmed Question Affirmed Notes Nurse Date/Time (Eastern Time) Disp. Time Eilene Ghazi Time) Disposition Final User 11/09/2014 7:07:22 PM Clinical Call Yes Harlow Mares, RN, Suanne Marker After Care Instructions Given Call Event Type User Date / Time Description Comments User: Tamala Fothergill, RN Date/Time (Eastern Time): 11/09/2014 7:07:08 PM Caller States she has high blood sugar and was seen in the office and put on antibiotics and was told that she could get false readings. only symptom his that she is thirsty, and hard to wake up not wanting to get out of bed. when she takes her readings she is not sure what it really is. BS is 368, 324, 313. Reports that she is being treated for cellulitis in her legs. She has been running high. Reports that she takes Lantis 30U and Humalog is sliding scale. (usually uses 10U of Lantis). Last BS was 324. Reports that she is ok now and she is feeling better. Denied triage. Advised to  continue to keep BS and log for further use. Caller denies current symptoms and states that she will call back if she gets to feeling bad or her BS continues to rise.

## 2014-11-12 ENCOUNTER — Telehealth: Payer: Self-pay | Admitting: Internal Medicine

## 2014-11-12 DIAGNOSIS — M545 Low back pain, unspecified: Secondary | ICD-10-CM

## 2014-11-12 DIAGNOSIS — G8929 Other chronic pain: Secondary | ICD-10-CM

## 2014-11-12 NOTE — Telephone Encounter (Signed)
Pt wanted to let MD know that she is not taking her zoloft. She doesn't know if she need more of it or something different. Sx: depressed mode, agitation, crying spells and uncontrolled thoughts.  Pt stated that she is not seeing spine specialist and wanted to know if her pain rx would be refilled. I did inform that b/c there was a positive UDS for cocaine. If not, then could she get a referral for pain management.   Pt also stated that she is not seeing any other MD for primary care needs and is not changing physicians.   Please advise on the above items.

## 2014-11-12 NOTE — Telephone Encounter (Signed)
Patient requests to speak with you. She would not tell me any detail, only that it is a "personal matter".  She wants to talk to dr Asa Lente but i advised that i would get her to you.

## 2014-11-13 ENCOUNTER — Other Ambulatory Visit: Payer: Self-pay | Admitting: Internal Medicine

## 2014-11-13 DIAGNOSIS — M545 Low back pain, unspecified: Secondary | ICD-10-CM | POA: Insufficient documentation

## 2014-11-13 DIAGNOSIS — G8929 Other chronic pain: Secondary | ICD-10-CM | POA: Insufficient documentation

## 2014-11-13 MED ORDER — INSULIN GLARGINE 100 UNIT/ML SOLOSTAR PEN: 30.0000 [IU] | PEN_INJECTOR | Freq: Two times a day (BID) | SUBCUTANEOUS | Status: DC

## 2014-11-13 MED ORDER — VENLAFAXINE HCL ER 37.5 MG PO CP24: 37.5000 mg | ORAL_CAPSULE | Freq: Every day | ORAL | Status: DC

## 2014-11-13 MED ORDER — POTASSIUM CHLORIDE CRYS ER 20 MEQ PO TBCR: 40.0000 meq | EXTENDED_RELEASE_TABLET | Freq: Two times a day (BID) | ORAL | Status: DC

## 2014-11-13 NOTE — Telephone Encounter (Signed)
Pt informed of the below MD advise and instructions.   Pt will have to pick up the alprazolam rx next time it is prescribed and must have a UDS collection.

## 2014-11-13 NOTE — Telephone Encounter (Signed)
Please let [name] know that I will prescribe different antidepressant to replace Zoloft - erx for venlafaxine 37.5mg  qd done -  We can titrate up in next 4-6 weeks if needed for symptoms - will need OV for same  Also, I will not resume rx'ing her controlled substances, specifically her narcotics - but I will refer her to pain clinic (done) Before her next alprazolam refill, she will need to have a repeat UDS here - not done since 03/2014  She is welcome to follow wherever she wishes for PC needs, here or practice of her choice - I am not dismissing her, but not rx'ing any narcotics

## 2014-11-14 DIAGNOSIS — J9620 Acute and chronic respiratory failure, unspecified whether with hypoxia or hypercapnia: Secondary | ICD-10-CM | POA: Diagnosis not present

## 2014-11-15 DIAGNOSIS — J9620 Acute and chronic respiratory failure, unspecified whether with hypoxia or hypercapnia: Secondary | ICD-10-CM | POA: Diagnosis not present

## 2014-11-16 ENCOUNTER — Telehealth: Payer: Self-pay | Admitting: Internal Medicine

## 2014-11-16 DIAGNOSIS — J9620 Acute and chronic respiratory failure, unspecified whether with hypoxia or hypercapnia: Secondary | ICD-10-CM | POA: Diagnosis not present

## 2014-11-16 NOTE — Telephone Encounter (Signed)
Wants to talk to you about medications.

## 2014-11-17 ENCOUNTER — Telehealth: Payer: Self-pay | Admitting: Internal Medicine

## 2014-11-17 DIAGNOSIS — J9620 Acute and chronic respiratory failure, unspecified whether with hypoxia or hypercapnia: Secondary | ICD-10-CM | POA: Diagnosis not present

## 2014-11-17 NOTE — Telephone Encounter (Signed)
Pt is wanting to know if the referral to pain management can be sent to [facility]? It is a pain management facility located on [address]

## 2014-11-17 NOTE — Telephone Encounter (Signed)
Pt wants to know if she should continue the spiralactone.

## 2014-11-17 NOTE — Telephone Encounter (Signed)
Patient sent an appointment request through my chart requesting to speak with Dr. Asa Lente.  Can you please follow up.

## 2014-11-18 ENCOUNTER — Other Ambulatory Visit (INDEPENDENT_AMBULATORY_CARE_PROVIDER_SITE_OTHER): Payer: Medicare Other

## 2014-11-18 ENCOUNTER — Encounter: Payer: Self-pay | Admitting: Pulmonary Disease

## 2014-11-18 ENCOUNTER — Ambulatory Visit (INDEPENDENT_AMBULATORY_CARE_PROVIDER_SITE_OTHER): Payer: Medicare Other | Admitting: Pulmonary Disease

## 2014-11-18 VITALS — BP 124/70 | HR 106 | Temp 97.6°F | Ht 68.0 in | Wt 302.2 lb

## 2014-11-18 DIAGNOSIS — J438 Other emphysema: Secondary | ICD-10-CM | POA: Diagnosis not present

## 2014-11-18 DIAGNOSIS — E662 Morbid (severe) obesity with alveolar hypoventilation: Secondary | ICD-10-CM | POA: Diagnosis not present

## 2014-11-18 DIAGNOSIS — J9611 Chronic respiratory failure with hypoxia: Secondary | ICD-10-CM

## 2014-11-18 DIAGNOSIS — J9620 Acute and chronic respiratory failure, unspecified whether with hypoxia or hypercapnia: Secondary | ICD-10-CM | POA: Diagnosis not present

## 2014-11-18 DIAGNOSIS — G4733 Obstructive sleep apnea (adult) (pediatric): Secondary | ICD-10-CM | POA: Diagnosis not present

## 2014-11-18 DIAGNOSIS — J441 Chronic obstructive pulmonary disease with (acute) exacerbation: Secondary | ICD-10-CM

## 2014-11-18 LAB — HEPATIC FUNCTION PANEL
ALK PHOS: 98 U/L (ref 39–117)
ALT: 48 U/L — AB (ref 0–35)
AST: 31 U/L (ref 0–37)
Albumin: 4.1 g/dL (ref 3.5–5.2)
BILIRUBIN DIRECT: 0.2 mg/dL (ref 0.0–0.3)
BILIRUBIN TOTAL: 1 mg/dL (ref 0.2–1.2)
TOTAL PROTEIN: 7.8 g/dL (ref 6.0–8.3)

## 2014-11-18 LAB — BASIC METABOLIC PANEL
BUN: 11 mg/dL (ref 6–23)
CALCIUM: 9.8 mg/dL (ref 8.4–10.5)
CO2: 34 mEq/L — ABNORMAL HIGH (ref 19–32)
Chloride: 93 mEq/L — ABNORMAL LOW (ref 96–112)
Creatinine, Ser: 1.01 mg/dL (ref 0.40–1.20)
GFR: 75.3 mL/min (ref >60.00)
GLUCOSE: 144 mg/dL — AB (ref 70–99)
Potassium: 3.6 mEq/L (ref 3.5–5.1)
SODIUM: 135 meq/L (ref 135–145)

## 2014-11-18 MED ORDER — SPIRONOLACTONE 25 MG PO TABS: 25.0000 mg | ORAL_TABLET | Freq: Two times a day (BID) | ORAL | Status: DC

## 2014-11-18 MED ORDER — PREDNISONE 10 MG PO TABS: ORAL_TABLET | ORAL | Status: DC

## 2014-11-18 NOTE — Telephone Encounter (Signed)
Yes. Continue Spironolactone 25 mg twice daily. Okay to send refill if needed

## 2014-11-18 NOTE — Assessment & Plan Note (Signed)
The patient continues to wear her bilevel compliantly, and is having no issues with her mask fit or pressure. I've encouraged her to work aggressively on weight loss.

## 2014-11-18 NOTE — Telephone Encounter (Signed)
Informed pt of below.

## 2014-11-18 NOTE — Assessment & Plan Note (Signed)
The patient is having increased shortness of breath above her baseline, and a cough with sticky white mucus. I do not think she is having a chest infection, but I suspect she has some ongoing airway inflammation after her hospital discharge. I would like to treat her with a short course of prednisone to see if this will return her to baseline. She has no bronchospasm on her lung exam today, and her saturations are more than adequate. She tells me that she has not smoked since discharge from the hospital, and I have asked her to continue with her abstinence.

## 2014-11-18 NOTE — Progress Notes (Signed)
   Subjective:    [name], female    DOB: 10-04-1965, 49 y.o.   MRN: [id number]  HPI The patient comes in today for a post hospital follow-up. She was discharged the end of January after a hospitalization for a COPD exacerbation. She had continued to smoke right up to the day of admission, but tells me she has not smoked since discharge. She never really returned back to her usual baseline, and his had some ongoing chest congestion and cough with sticky white mucus. She has not had any wheezing, but her breathing is not back to baseline.   Review of Systems  Constitutional: Negative for fever and unexpected weight change.  HENT: Positive for congestion and rhinorrhea. Negative for dental problem, ear pain, nosebleeds, postnasal drip, sinus pressure, sneezing, sore throat and trouble swallowing.   Eyes: Negative for redness and itching.  Respiratory: Positive for cough, chest tightness, shortness of breath and wheezing.   Cardiovascular: Negative for palpitations and leg swelling.  Gastrointestinal: Positive for nausea. Negative for vomiting.  Genitourinary: Negative for dysuria.  Musculoskeletal: Negative for joint swelling.  Skin: Negative for rash.  Neurological: Negative for headaches.  Hematological: Does not bruise/bleed easily.  Psychiatric/Behavioral: Negative for dysphoric mood. The patient is not nervous/anxious.        Objective:   Physical Exam Morbidly obese female in no acute distress Nose without purulence or discharge noted Neck without lymphadenopathy or thyromegaly Chest with decreased breath sounds, but no crackles or wheezes. Loud upper airway pseudo wheezing noted Cardiac exam with regular rate and rhythm Lower extremities with 1+ edema, no cyanosis Alert and oriented, moves all 4 extremities.       Assessment & Plan:

## 2014-11-18 NOTE — Addendum Note (Signed)
Addended by: Gwendolyn Grant A on: 11/18/2014 12:34 PM   Modules accepted: Orders

## 2014-11-18 NOTE — Patient Instructions (Addendum)
Will treat with a prednisone taper over 8 days to see if it will help cough and mucus production.  No change in your breathing medications Continue to stay away from cigarettes Take mucinex DM one in am and pm until better followup with me again in 58mos.

## 2014-11-19 DIAGNOSIS — J9620 Acute and chronic respiratory failure, unspecified whether with hypoxia or hypercapnia: Secondary | ICD-10-CM | POA: Diagnosis not present

## 2014-11-20 ENCOUNTER — Encounter: Payer: Self-pay | Admitting: Family

## 2014-11-20 DIAGNOSIS — J9819 Other pulmonary collapse: Secondary | ICD-10-CM | POA: Diagnosis not present

## 2014-11-20 DIAGNOSIS — J9620 Acute and chronic respiratory failure, unspecified whether with hypoxia or hypercapnia: Secondary | ICD-10-CM | POA: Diagnosis not present

## 2014-11-21 DIAGNOSIS — J9620 Acute and chronic respiratory failure, unspecified whether with hypoxia or hypercapnia: Secondary | ICD-10-CM | POA: Diagnosis not present

## 2014-11-22 DIAGNOSIS — J9620 Acute and chronic respiratory failure, unspecified whether with hypoxia or hypercapnia: Secondary | ICD-10-CM | POA: Diagnosis not present

## 2014-11-23 DIAGNOSIS — J9620 Acute and chronic respiratory failure, unspecified whether with hypoxia or hypercapnia: Secondary | ICD-10-CM | POA: Diagnosis not present

## 2014-11-24 NOTE — Telephone Encounter (Signed)
Pt called in wanted to know if she could have her acidophilus (RISAQUAD) CAPS capsule [[id number]] faxed to [facility]?

## 2014-11-26 ENCOUNTER — Other Ambulatory Visit: Payer: Self-pay | Admitting: Internal Medicine

## 2014-11-27 ENCOUNTER — Ambulatory Visit: Payer: Self-pay | Admitting: Internal Medicine

## 2014-11-28 DIAGNOSIS — J9620 Acute and chronic respiratory failure, unspecified whether with hypoxia or hypercapnia: Secondary | ICD-10-CM | POA: Diagnosis not present

## 2014-11-29 DIAGNOSIS — J9620 Acute and chronic respiratory failure, unspecified whether with hypoxia or hypercapnia: Secondary | ICD-10-CM | POA: Diagnosis not present

## 2014-11-30 ENCOUNTER — Other Ambulatory Visit: Payer: Self-pay

## 2014-11-30 DIAGNOSIS — M542 Cervicalgia: Secondary | ICD-10-CM | POA: Diagnosis not present

## 2014-11-30 DIAGNOSIS — J9620 Acute and chronic respiratory failure, unspecified whether with hypoxia or hypercapnia: Secondary | ICD-10-CM | POA: Diagnosis not present

## 2014-11-30 MED ORDER — RISAQUAD PO CAPS: 1.0000 | ORAL_CAPSULE | Freq: Every day | ORAL | Status: DC

## 2014-12-01 ENCOUNTER — Other Ambulatory Visit: Payer: Self-pay

## 2014-12-01 DIAGNOSIS — J9620 Acute and chronic respiratory failure, unspecified whether with hypoxia or hypercapnia: Secondary | ICD-10-CM | POA: Diagnosis not present

## 2014-12-01 NOTE — Telephone Encounter (Signed)
Ok to call in #100 tabs tramadol for pain - see pended rx Also, pt should not take more than 6 tabs Tylenol Extra Strength in 24h span (ie, max is 3000mg  acetaminophen/24g) thanks

## 2014-12-01 NOTE — Telephone Encounter (Signed)
Pt called and wanted to know if there was anything that could be rx for pain. She has an appt with pain clinic on 12/21/14. She does understand that she will not be rx any controlled substances but wanted to know if there was something else. Pt stated that she has taken 4 tylenol every 4 hours without any relief.

## 2014-12-02 ENCOUNTER — Encounter: Payer: Self-pay | Admitting: Physician Assistant

## 2014-12-02 DIAGNOSIS — G4733 Obstructive sleep apnea (adult) (pediatric): Secondary | ICD-10-CM | POA: Diagnosis not present

## 2014-12-02 DIAGNOSIS — J9620 Acute and chronic respiratory failure, unspecified whether with hypoxia or hypercapnia: Secondary | ICD-10-CM | POA: Diagnosis not present

## 2014-12-02 MED ORDER — TRAMADOL HCL 50 MG PO TABS: 100.0000 mg | ORAL_TABLET | Freq: Two times a day (BID) | ORAL | Status: DC | PRN

## 2014-12-03 ENCOUNTER — Encounter: Payer: Self-pay | Admitting: Internal Medicine

## 2014-12-03 DIAGNOSIS — J9620 Acute and chronic respiratory failure, unspecified whether with hypoxia or hypercapnia: Secondary | ICD-10-CM | POA: Diagnosis not present

## 2014-12-04 DIAGNOSIS — J9620 Acute and chronic respiratory failure, unspecified whether with hypoxia or hypercapnia: Secondary | ICD-10-CM | POA: Diagnosis not present

## 2014-12-05 DIAGNOSIS — J9620 Acute and chronic respiratory failure, unspecified whether with hypoxia or hypercapnia: Secondary | ICD-10-CM | POA: Diagnosis not present

## 2014-12-06 ENCOUNTER — Encounter (HOSPITAL_COMMUNITY): Payer: Self-pay | Admitting: Emergency Medicine

## 2014-12-06 ENCOUNTER — Emergency Department (HOSPITAL_COMMUNITY)
Admission: EM | Admit: 2014-12-06 | Discharge: 2014-12-06 | Disposition: A | Discharge status: Home / Self Care | Payer: Medicare Other | Attending: Emergency Medicine | Admitting: Emergency Medicine

## 2014-12-06 DIAGNOSIS — J9620 Acute and chronic respiratory failure, unspecified whether with hypoxia or hypercapnia: Secondary | ICD-10-CM | POA: Diagnosis not present

## 2014-12-06 DIAGNOSIS — Z79899 Other long term (current) drug therapy: Secondary | ICD-10-CM | POA: Insufficient documentation

## 2014-12-06 DIAGNOSIS — F329 Major depressive disorder, single episode, unspecified: Secondary | ICD-10-CM | POA: Diagnosis not present

## 2014-12-06 DIAGNOSIS — J449 Chronic obstructive pulmonary disease, unspecified: Secondary | ICD-10-CM | POA: Insufficient documentation

## 2014-12-06 DIAGNOSIS — Z86010 Personal history of colonic polyps: Secondary | ICD-10-CM | POA: Diagnosis not present

## 2014-12-06 DIAGNOSIS — G8929 Other chronic pain: Secondary | ICD-10-CM | POA: Insufficient documentation

## 2014-12-06 DIAGNOSIS — Z87891 Personal history of nicotine dependence: Secondary | ICD-10-CM | POA: Insufficient documentation

## 2014-12-06 DIAGNOSIS — Z7982 Long term (current) use of aspirin: Secondary | ICD-10-CM | POA: Insufficient documentation

## 2014-12-06 DIAGNOSIS — F410 Panic disorder [episodic paroxysmal anxiety] without agoraphobia: Secondary | ICD-10-CM | POA: Insufficient documentation

## 2014-12-06 DIAGNOSIS — R7309 Other abnormal glucose: Secondary | ICD-10-CM | POA: Diagnosis not present

## 2014-12-06 DIAGNOSIS — R739 Hyperglycemia, unspecified: Secondary | ICD-10-CM

## 2014-12-06 DIAGNOSIS — E1165 Type 2 diabetes mellitus with hyperglycemia: Secondary | ICD-10-CM | POA: Diagnosis not present

## 2014-12-06 DIAGNOSIS — Z9981 Dependence on supplemental oxygen: Secondary | ICD-10-CM | POA: Diagnosis not present

## 2014-12-06 DIAGNOSIS — Z8719 Personal history of other diseases of the digestive system: Secondary | ICD-10-CM | POA: Diagnosis not present

## 2014-12-06 DIAGNOSIS — I509 Heart failure, unspecified: Secondary | ICD-10-CM | POA: Diagnosis not present

## 2014-12-06 DIAGNOSIS — Z794 Long term (current) use of insulin: Secondary | ICD-10-CM | POA: Diagnosis not present

## 2014-12-06 DIAGNOSIS — Z91040 Latex allergy status: Secondary | ICD-10-CM | POA: Insufficient documentation

## 2014-12-06 DIAGNOSIS — F419 Anxiety disorder, unspecified: Secondary | ICD-10-CM | POA: Diagnosis not present

## 2014-12-06 DIAGNOSIS — G4730 Sleep apnea, unspecified: Secondary | ICD-10-CM | POA: Diagnosis not present

## 2014-12-06 DIAGNOSIS — R197 Diarrhea, unspecified: Secondary | ICD-10-CM | POA: Insufficient documentation

## 2014-12-06 DIAGNOSIS — I10 Essential (primary) hypertension: Secondary | ICD-10-CM | POA: Insufficient documentation

## 2014-12-06 DIAGNOSIS — Z87448 Personal history of other diseases of urinary system: Secondary | ICD-10-CM | POA: Insufficient documentation

## 2014-12-06 DIAGNOSIS — Z7952 Long term (current) use of systemic steroids: Secondary | ICD-10-CM | POA: Diagnosis not present

## 2014-12-06 LAB — CBC
HCT: 41.5 % (ref 36.0–46.0)
Hemoglobin: 13.2 g/dL (ref 12.0–15.0)
MCH: 25.2 pg — ABNORMAL LOW (ref 26.0–34.0)
MCHC: 31.8 g/dL (ref 30.0–36.0)
MCV: 79.2 fL (ref 78.0–100.0)
PLATELETS: 152 10*3/uL (ref 150–400)
RBC: 5.24 MIL/uL — ABNORMAL HIGH (ref 3.87–5.11)
RDW: 17.7 % — AB (ref 11.5–15.5)
WBC: 5.4 10*3/uL (ref 4.0–10.5)

## 2014-12-06 LAB — I-STAT ARTERIAL BLOOD GAS, ED
Acid-Base Excess: 8 mmol/L — ABNORMAL HIGH (ref 0.0–2.0)
BICARBONATE: 35.1 meq/L — AB (ref 20.0–24.0)
O2 Saturation: 93 %
Patient temperature: 98.6
TCO2: 37 mmol/L (ref 0–100)
pCO2 arterial: 59.4 mmHg (ref 35.0–45.0)
pH, Arterial: 7.379 (ref 7.350–7.450)
pO2, Arterial: 72 mmHg — ABNORMAL LOW (ref 80.0–100.0)

## 2014-12-06 LAB — URINALYSIS, ROUTINE W REFLEX MICROSCOPIC
Bilirubin Urine: NEGATIVE
GLUCOSE, UA: 500 mg/dL — AB
Ketones, ur: NEGATIVE mg/dL
Leukocytes, UA: NEGATIVE
Nitrite: NEGATIVE
PH: 7 (ref 5.0–8.0)
Protein, ur: NEGATIVE mg/dL
Specific Gravity, Urine: 1.007 (ref 1.005–1.030)
UROBILINOGEN UA: 0.2 mg/dL (ref 0.0–1.0)

## 2014-12-06 LAB — BASIC METABOLIC PANEL
Anion gap: 15 (ref 5–15)
BUN: 8 mg/dL (ref 6–23)
CO2: 30 mmol/L (ref 19–32)
CREATININE: 0.83 mg/dL (ref 0.50–1.10)
Calcium: 7.9 mg/dL — ABNORMAL LOW (ref 8.4–10.5)
Chloride: 91 mmol/L — ABNORMAL LOW (ref 96–112)
GFR calc Af Amer: >90 mL/min (ref >90)
GFR calc non Af Amer: 83 mL/min — ABNORMAL LOW (ref >90)
Glucose, Bld: 267 mg/dL — ABNORMAL HIGH (ref 70–99)
Potassium: 2.9 mmol/L — ABNORMAL LOW (ref 3.5–5.1)
Sodium: 136 mmol/L (ref 135–145)

## 2014-12-06 LAB — CBG MONITORING, ED
Glucose-Capillary: 281 mg/dL — ABNORMAL HIGH (ref 70–99)
Glucose-Capillary: 210 mg/dL — ABNORMAL HIGH (ref 70–99)

## 2014-12-06 LAB — URINE MICROSCOPIC-ADD ON: RBC / HPF: NONE SEEN RBC/hpf (ref <3)

## 2014-12-06 MED ORDER — DIPHENHYDRAMINE HCL 50 MG/ML IJ SOLN
25.0000 mg | Freq: Once | INTRAMUSCULAR | Status: AC
  Administered 2014-12-06: 25 mg via INTRAVENOUS
  Filled 2014-12-06: qty 1

## 2014-12-06 MED ORDER — POTASSIUM CHLORIDE CRYS ER 20 MEQ PO TBCR
60.0000 meq | EXTENDED_RELEASE_TABLET | Freq: Once | ORAL | Status: AC
  Administered 2014-12-06: 60 meq via ORAL
  Filled 2014-12-06: qty 3

## 2014-12-06 NOTE — ED Provider Notes (Signed)
CSN: [id number]     Arrival date & time 12/06/14  1229 History   First MD Initiated Contact with Patient 12/06/14 1231     Chief Complaint  Patient presents with  . Hyperglycemia     (Consider location/radiation/quality/duration/timing/severity/associated sxs/prior Treatment) HPI Comments: Patient is a 49 yo F PMHx significant for HTN, HLD, Fibromylagia, GERD, COPD, DM, Chronic back pain presenting tot he ED for evaluation of hyperglycemia. Patient states her blood sugars have been running in the 300s the last few days after being sick with URI like symptoms and 1 day of non-bloody diarrhea. She states she has been taking her insulin as prescribed without missed doses. Patient is also complaining that since starting the Tramadol she has felt itchy all over and has been taking Benadryl with little to no improvement.    Past Medical History  Diagnosis Date  . Hypertension   . Hyperlipidemia   . Chronic headache   . Fibromyalgia     daily narcotics  . Anxiety     hx chronic BZ use, stopped 07/2010  . Anemia   . Pulmonary sarcoidosis     unimpressive CT chest 2011  . Colonic polyp   . GERD (gastroesophageal reflux disease)   . ALLERGIC RHINITIS   . Asthma   . CHF (congestive heart failure)     Diastolic with fluid overload, May, 2012, LVEF 60%  . Morbid obesity   . Depression   . Panic attacks   . Diabetes mellitus   . COPD (chronic obstructive pulmonary disease)     on home O2, moderate airflow obstruction, suspect d/t emphysema  . Obesity   . Elevated LFTs 09/2011  . Ovarian cyst   . Chronic back pain   . Sleep apnea      CPAP  . Fatty liver   . Esophagitis   . Gastritis   . Internal hemorrhoids   . Adenomatous colon polyp 12/03/09    [facility] in [place], [place]   Past Surgical History  Procedure Laterality Date  . Polypectomy  2011  . Lumbar microdiscectomy  07/06/2011    R L4-5, stern  . Back surgery    . Carpal tunnel release    . Steroid spinal  injections    . Hysteroscopy w/d&c N/A 03/25/2013    Procedure: DILATATION AND CURETTAGE /HYSTEROSCOPY;  Surgeon: [name], MD;  Location: [facility];  Service: Gynecology;  Laterality: N/A;  . Dilation and curettage of uterus     Family History  Problem Relation Age of Onset  . Hypertension Mother   . Emphysema Father   . Hypertension Father   . Stomach cancer Father   . Allergies Brother   . Hypertension Brother   . Stomach cancer Brother   . Heart disease Mother   . Heart disease Father   . Heart disease Brother     died age 77 sudden death/MI   History  Substance Use Topics  . Smoking status: Former Smoker -- 0.25 packs/day for 29 years    Types: Cigarettes    Quit date: 10/26/2014  . Smokeless tobacco: Never Used  . Alcohol Use: 0.0 oz/week    0 Standard drinks or equivalent per week     Comment: occasionally   OB History    No data available     Review of Systems  Respiratory: Positive for cough.   Gastrointestinal: Positive for nausea and diarrhea. Negative for vomiting and abdominal pain.  All other systems reviewed and are negative.  Allergies  Simvastatin; Sulfamethoxazole-trimethoprim; Zolpidem tartrate; Azithromycin; Doxycycline; Latex; Metformin and related; Metronidazole; Ceftin; Metolazone; Ciprofloxacin; and Tramadol  Home Medications   Prior to Admission medications   Medication Sig Start Date End Date Taking? Authorizing Provider  acetaminophen (TYLENOL) 325 MG tablet Take 1,300 mg by mouth every 6 (six) hours as needed.   Yes Historical Provider, MD  albuterol (PROVENTIL) (2.5 MG/3ML) 0.083% nebulizer solution INHALE CONTENTS OF 1 VIAL VIA NEBULIZER EVERY 6 HOURS AS NEEDED FOR WHEEZING OR SHORTNESS OF BREATH 11/13/14  Yes Rowe Clack, MD  ALPRAZolam Duanne Moron) 1 MG tablet Take 1 mg by mouth 3 (three) times daily.   Yes Historical Provider, MD  aspirin EC 81 MG tablet Take 81 mg by mouth daily.   Yes Historical Provider, MD  BIOTIN PO Take 1  capsule by mouth daily.   Yes Historical Provider, MD  CAPSAICIN EX Apply 1 application topically as needed (arthritis pain).   Yes Historical Provider, MD  cetirizine (ZYRTEC) 1 MG/ML syrup TAKE 10 ML BY MOUTH EVERY NIGHT AT BEDTIME 09/07/14  Yes Rowe Clack, MD  Coenzyme Q10 (CO Q 10) 10 MG CAPS Take 1 capsule by mouth daily.   Yes Historical Provider, MD  esomeprazole (NEXIUM) 40 MG capsule Take 40 mg by mouth 2 (two) times daily before a meal.   Yes Historical Provider, MD  fluticasone (FLONASE) 50 MCG/ACT nasal spray Place 2 sprays into both nostrils daily. 10/19/14  Yes Rowe Clack, MD  gabapentin (NEURONTIN) 300 MG capsule Take 300 mg by mouth at bedtime.   Yes Historical Provider, MD  glimepiride (AMARYL) 1 MG tablet Take 1 mg by mouth daily with breakfast.   Yes Historical Provider, MD  hydrOXYzine (ATARAX/VISTARIL) 10 MG tablet TAKE 1 TABLET BY MOUTH THREE TIMES DAILY AS NEEDED FOR ITCHING 08/27/14  Yes Biagio Borg, MD  ibuprofen (ADVIL,MOTRIN) 200 MG tablet Take 800 mg by mouth every 6 (six) hours as needed for moderate pain.   Yes Historical Provider, MD  insulin aspart (NOVOLOG FLEXPEN) 100 UNIT/ML FlexPen Use as directed per sliding scale indicated by your physician. Patient taking differently: Inject 1-11 Units into the skin 3 (three) times daily with meals. Use as directed per sliding scale indicated by your physician. 09/22/14  Yes Rowe Clack, MD  Insulin Glargine (LANTUS SOLOSTAR) 100 UNIT/ML Solostar Pen Inject 16 Units into the skin daily at 10 pm. Patient taking differently: Inject 30 Units into the skin 2 (two) times daily.  11/27/14  Yes Rowe Clack, MD  Insulin Pen Needle 31G X 8 MM MISC Use to administer insulin (Solostar Pen). 09/28/14  Yes Rowe Clack, MD  Multiple Vitamin (MULTIVITAMIN) capsule Take 1 capsule by mouth daily.   Yes Historical Provider, MD  naproxen sodium (ANAPROX) 220 MG tablet Take 440 mg by mouth 2 (two) times daily as  needed (pain).   Yes Historical Provider, MD  omega-3 fish oil (MAXEPA) 1000 MG CAPS capsule Take 1 capsule by mouth daily.   Yes Historical Provider, MD  ONE TOUCH ULTRA TEST test strip  11/01/14  Yes Historical Provider, MD  Phenylephrine-DM (THERAFLU COLD/COUGH DAYTIME PO) Take 10 mLs by mouth as needed (cold and cough symptoms).   Yes Historical Provider, MD  potassium chloride SA (K-DUR,KLOR-CON) 20 MEQ tablet Take 2 tablets (40 mEq total) by mouth 2 (two) times daily. 11/13/14  Yes Rowe Clack, MD  PROAIR HFA 108 (90 BASE) MCG/ACT inhaler INHALE 2 PUFFS BY MOUTH EVERY 6 HOURS  AS NEEDED 08/28/14  Yes Rowe Clack, MD  SPIRIVA HANDIHALER 18 MCG inhalation capsule INHALE CONTENTS OF 1 CAPSULE USING HANDIHALER EVERY DAY 08/24/14  Yes Kathee Delton, MD  SYMBICORT 160-4.5 MCG/ACT inhaler INHALE 2 PUFFS BY MOUTH TWICE DAILY 10/19/14  Yes Kathee Delton, MD  tiZANidine (ZANAFLEX) 4 MG tablet TAKE 1 TABLET BY MOUTH EVERY 8 HOURS AS NEEDED FOR MUSCLE SPASMS 11/10/14  Yes Rowe Clack, MD  torsemide (DEMADEX) 100 MG tablet Take 1 tablet (100 mg total) by mouth 2 (two) times daily. 07/30/14  Yes Biagio Borg, MD  varenicline (CHANTIX) 0.5 MG tablet Take 1 tablet (0.5 mg total) by mouth 2 (two) times daily. 11/03/14  Yes Golden Circle, FNP  venlafaxine XR (EFFEXOR XR) 37.5 MG 24 hr capsule Take 1 capsule (37.5 mg total) by mouth daily with breakfast. 11/13/14  Yes Rowe Clack, MD  acidophilus (RISAQUAD) CAPS capsule Take 1 capsule by mouth daily. Patient not taking: Reported on 12/06/2014 11/30/14   Rowe Clack, MD  predniSONE (DELTASONE) 10 MG tablet Take 4 tabs daily x 2 days, 3 tabs daily x 2 days, 2 tabs daily x 2 days, 1 tab daily x 2 days Patient not taking: Reported on 12/06/2014 11/18/14   Kathee Delton, MD  spironolactone (ALDACTONE) 25 MG tablet Take 1 tablet (25 mg total) by mouth 2 (two) times daily. Patient not taking: Reported on 12/06/2014 11/18/14   Rowe Clack,  MD  traMADol (ULTRAM) 50 MG tablet Take 2 tablets (100 mg total) by mouth every 12 (twelve) hours as needed for severe pain. Patient not taking: Reported on 12/06/2014 12/02/14   Rowe Clack, MD   BP 103/66 mmHg  Pulse 98  Temp(Src) 98.8 F (37.1 C) (Oral)  Resp 19  Ht 5\' 8"  (1.727 m)  Wt 296 lb (134.265 kg)  BMI 45.02 kg/m2  SpO2 94%  LMP 10/16/2012 Physical Exam  Constitutional: She is oriented to person, place, and time. She appears well-developed and well-nourished. No distress. Nasal cannula in place.  Patient on home O2  HENT:  Head: Normocephalic and atraumatic.  Right Ear: External ear normal.  Left Ear: External ear normal.  Nose: Nose normal.  Mouth/Throat: Oropharynx is clear and moist. No oropharyngeal exudate.  Eyes: Conjunctivae are normal.  Neck: Normal range of motion. Neck supple.  Cardiovascular: Normal rate, regular rhythm and normal heart sounds.   Pulmonary/Chest: Effort normal and breath sounds normal.  Abdominal: Soft. Bowel sounds are normal. There is no tenderness.  Musculoskeletal: Normal range of motion.  Neurological: She is alert and oriented to person, place, and time.  Skin: Skin is warm and dry. She is not diaphoretic.  Psychiatric: She has a normal mood and affect.  Nursing note and vitals reviewed.   ED Course  Procedures (including critical care time) Medications  diphenhydrAMINE (BENADRYL) injection 25 mg (25 mg Intravenous Given 12/06/14 1355)  potassium chloride SA (K-DUR,KLOR-CON) CR tablet 60 mEq (60 mEq Oral Given 12/06/14 1609)    Labs Review Labs Reviewed  BASIC METABOLIC PANEL - Abnormal; Notable for the following:    Potassium 2.9 (*)    Chloride 91 (*)    Glucose, Bld 267 (*)    Calcium 7.9 (*)    GFR calc non Af Amer 83 (*)    All other components within normal limits  CBC - Abnormal; Notable for the following:    RBC 5.24 (*)    MCH 25.2 (*)  RDW 17.7 (*)    All other components within normal limits   URINALYSIS, ROUTINE W REFLEX MICROSCOPIC - Abnormal; Notable for the following:    APPearance CLOUDY (*)    Glucose, UA 500 (*)    Hgb urine dipstick TRACE (*)    All other components within normal limits  CBG MONITORING, ED - Abnormal; Notable for the following:    Glucose-Capillary 281 (*)    All other components within normal limits  I-STAT ARTERIAL BLOOD GAS, ED - Abnormal; Notable for the following:    pCO2 arterial 59.4 (*)    pO2, Arterial 72.0 (*)    Bicarbonate 35.1 (*)    Acid-Base Excess 8.0 (*)    All other components within normal limits  CBG MONITORING, ED - Abnormal; Notable for the following:    Glucose-Capillary 210 (*)    All other components within normal limits  URINE MICROSCOPIC-ADD ON    Imaging Review No results found.   EKG Interpretation None      MDM   Final diagnoses:  Hyperglycemia without ketosis    Filed Vitals:   12/06/14 1600  BP: 103/66  Pulse: 98  Temp:   Resp: 19   Afebrile, NAD, non-toxic appearing, AAOx4.  I have reviewed nursing notes, vital signs, and all appropriate lab and imaging results for this patient.  Patient presenting with two complaints.  1) Hyperglycemia: Patient noted to be hypergylcemic in the ED, no evidence of DKA. No evidence of metabolic acidosis. No ketones in urine. Glucose levels improved with IVF administration. Discussed importance of at home blood sugar control.   2) Allergic reaction: Patient re-evaluated prior to dc, is hemodynamically stable, in no respiratory distress, and denies the feeling of throat closing. Pt has been advised to take OTC benadryl & return to the ED if they have a mod-severe allergic rxn (s/s including throat closing, difficulty breathing, swelling of lips face or tongue). Advised to d/c Tramadol.  Pt is to follow up with their PCP. Pt is agreeable with plan & verbalizes understanding. Patient is stable at time of discharge.      Baron Sane, PA-C 12/06/14  [place], MD 12/09/14 [phone]

## 2014-12-06 NOTE — Discharge Instructions (Signed)
Please follow up with your primary care physician in 1-2 days. If you do not have one please call the [facility] number listed above. Please read all discharge instructions and return precautions.    Hyperglycemia Hyperglycemia occurs when the glucose (sugar) in your blood is too high. Hyperglycemia can happen for many reasons, but it most often happens to people who do not know they have diabetes or are not managing their diabetes properly.  CAUSES  Whether you have diabetes or not, there are other causes of hyperglycemia. Hyperglycemia can occur when you have diabetes, but it can also occur in other situations that you might not be as aware of, such as: Diabetes  If you have diabetes and are having problems controlling your blood glucose, hyperglycemia could occur because of some of the following reasons:  Not following your meal plan.  Not taking your diabetes medications or not taking it properly.  Exercising less or doing less activity than you normally do.  Being sick. Pre-diabetes  This cannot be ignored. Before people develop Type 2 diabetes, they almost always have "pre-diabetes." This is when your blood glucose levels are higher than normal, but not yet high enough to be diagnosed as diabetes. Research has shown that some long-term damage to the body, especially the heart and circulatory system, may already be occurring during pre-diabetes. If you take action to manage your blood glucose when you have pre-diabetes, you may delay or prevent Type 2 diabetes from developing. Stress  If you have diabetes, you may be "diet" controlled or on oral medications or insulin to control your diabetes. However, you may find that your blood glucose is higher than usual in the hospital whether you have diabetes or not. This is often referred to as "stress hyperglycemia." Stress can elevate your blood glucose. This happens because of hormones put out by the body during times of  stress. If stress has been the cause of your high blood glucose, it can be followed regularly by your caregiver. That way he/she can make sure your hyperglycemia does not continue to get worse or progress to diabetes. Steroids  Steroids are medications that act on the infection fighting system (immune system) to block inflammation or infection. One side effect can be a rise in blood glucose. Most people can produce enough extra insulin to allow for this rise, but for those who cannot, steroids make blood glucose levels go even higher. It is not unusual for steroid treatments to "uncover" diabetes that is developing. It is not always possible to determine if the hyperglycemia will go away after the steroids are stopped. A special blood test called an A1c is sometimes done to determine if your blood glucose was elevated before the steroids were started. SYMPTOMS  Thirsty.  Frequent urination.  Dry mouth.  Blurred vision.  Tired or fatigue.  Weakness.  Sleepy.  Tingling in feet or leg. DIAGNOSIS  Diagnosis is made by monitoring blood glucose in one or all of the following ways:  A1c test. This is a chemical found in your blood.  Fingerstick blood glucose monitoring.  Laboratory results. TREATMENT  First, knowing the cause of the hyperglycemia is important before the hyperglycemia can be treated. Treatment may include, but is not be limited to:  Education.  Change or adjustment in medications.  Change or adjustment in meal plan.  Treatment for an illness, infection, etc.  More frequent blood glucose monitoring.  Change in exercise plan.  Decreasing or stopping steroids.  Lifestyle changes. HOME CARE INSTRUCTIONS   Test your blood glucose as directed.  Exercise regularly. Your caregiver will give you instructions about exercise. Pre-diabetes or diabetes which comes on with stress is helped by exercising.  Eat wholesome, balanced meals. Eat often and at regular, fixed  times. Your caregiver or nutritionist will give you a meal plan to guide your sugar intake.  Being at an ideal weight is important. If needed, losing as little as 10 to 15 pounds may help improve blood glucose levels. SEEK MEDICAL CARE IF:   You have questions about medicine, activity, or diet.  You continue to have symptoms (problems such as increased thirst, urination, or weight gain). SEEK IMMEDIATE MEDICAL CARE IF:   You are vomiting or have diarrhea.  Your breath smells fruity.  You are breathing faster or slower.  You are very sleepy or incoherent.  You have numbness, tingling, or pain in your feet or hands.  You have chest pain.  Your symptoms get worse even though you have been following your caregiver's orders.  If you have any other questions or concerns. Document Released: 03/07/2001 Document Revised: 12/04/2011 Document Reviewed: 01/08/2012 [facility] Patient Information 2015 [place], [place]. This information is not intended to replace advice given to you by your health care provider. Make sure you discuss any questions you have with your health care provider.

## 2014-12-06 NOTE — ED Notes (Signed)
Pt sts she started feeling sick last night and woke up this morning and blood sugar was 307. Pt sts she take insulin as prescribed and has had 30 units of Lantus and 6 units of novolog this morning. Pt sts she started taking tramadol new prescription three days ago for back pain. Pt sts it has been making her itch and has taken benadryl without relief.

## 2014-12-07 ENCOUNTER — Encounter: Payer: Self-pay | Admitting: Internal Medicine

## 2014-12-07 ENCOUNTER — Other Ambulatory Visit: Payer: Self-pay | Admitting: Internal Medicine

## 2014-12-07 DIAGNOSIS — J9620 Acute and chronic respiratory failure, unspecified whether with hypoxia or hypercapnia: Secondary | ICD-10-CM | POA: Diagnosis not present

## 2014-12-08 ENCOUNTER — Ambulatory Visit: Payer: Self-pay | Admitting: Neurology

## 2014-12-08 DIAGNOSIS — J9620 Acute and chronic respiratory failure, unspecified whether with hypoxia or hypercapnia: Secondary | ICD-10-CM | POA: Diagnosis not present

## 2014-12-09 ENCOUNTER — Emergency Department (HOSPITAL_COMMUNITY): Payer: Medicare Other

## 2014-12-09 ENCOUNTER — Inpatient Hospital Stay (HOSPITAL_COMMUNITY)
Admission: EM | Admit: 2014-12-09 | Discharge: 2014-12-18 | DRG: 189 | Disposition: A | Discharge status: Home / Self Care | Payer: Medicare Other | Attending: Internal Medicine | Admitting: Internal Medicine

## 2014-12-09 ENCOUNTER — Encounter (HOSPITAL_COMMUNITY): Payer: Self-pay | Admitting: *Deleted

## 2014-12-09 DIAGNOSIS — Z888 Allergy status to other drugs, medicaments and biological substances status: Secondary | ICD-10-CM | POA: Diagnosis not present

## 2014-12-09 DIAGNOSIS — E1165 Type 2 diabetes mellitus with hyperglycemia: Secondary | ICD-10-CM | POA: Diagnosis present

## 2014-12-09 DIAGNOSIS — Z881 Allergy status to other antibiotic agents status: Secondary | ICD-10-CM | POA: Diagnosis not present

## 2014-12-09 DIAGNOSIS — J9611 Chronic respiratory failure with hypoxia: Secondary | ICD-10-CM | POA: Diagnosis not present

## 2014-12-09 DIAGNOSIS — Z7951 Long term (current) use of inhaled steroids: Secondary | ICD-10-CM | POA: Diagnosis not present

## 2014-12-09 DIAGNOSIS — J441 Chronic obstructive pulmonary disease with (acute) exacerbation: Secondary | ICD-10-CM | POA: Diagnosis present

## 2014-12-09 DIAGNOSIS — R079 Chest pain, unspecified: Secondary | ICD-10-CM | POA: Diagnosis not present

## 2014-12-09 DIAGNOSIS — D869 Sarcoidosis, unspecified: Secondary | ICD-10-CM | POA: Diagnosis not present

## 2014-12-09 DIAGNOSIS — Z825 Family history of asthma and other chronic lower respiratory diseases: Secondary | ICD-10-CM

## 2014-12-09 DIAGNOSIS — J9601 Acute respiratory failure with hypoxia: Secondary | ICD-10-CM | POA: Diagnosis not present

## 2014-12-09 DIAGNOSIS — I952 Hypotension due to drugs: Secondary | ICD-10-CM | POA: Diagnosis not present

## 2014-12-09 DIAGNOSIS — E861 Hypovolemia: Secondary | ICD-10-CM

## 2014-12-09 DIAGNOSIS — Z6841 Body Mass Index (BMI) 40.0 and over, adult: Secondary | ICD-10-CM

## 2014-12-09 DIAGNOSIS — R7989 Other specified abnormal findings of blood chemistry: Secondary | ICD-10-CM | POA: Diagnosis not present

## 2014-12-09 DIAGNOSIS — I5031 Acute diastolic (congestive) heart failure: Secondary | ICD-10-CM | POA: Diagnosis not present

## 2014-12-09 DIAGNOSIS — I517 Cardiomegaly: Secondary | ICD-10-CM | POA: Diagnosis not present

## 2014-12-09 DIAGNOSIS — I272 Other secondary pulmonary hypertension: Secondary | ICD-10-CM | POA: Diagnosis not present

## 2014-12-09 DIAGNOSIS — J42 Unspecified chronic bronchitis: Secondary | ICD-10-CM | POA: Diagnosis not present

## 2014-12-09 DIAGNOSIS — Z9981 Dependence on supplemental oxygen: Secondary | ICD-10-CM

## 2014-12-09 DIAGNOSIS — Z79899 Other long term (current) drug therapy: Secondary | ICD-10-CM | POA: Diagnosis not present

## 2014-12-09 DIAGNOSIS — J189 Pneumonia, unspecified organism: Secondary | ICD-10-CM | POA: Diagnosis not present

## 2014-12-09 DIAGNOSIS — Z794 Long term (current) use of insulin: Secondary | ICD-10-CM

## 2014-12-09 DIAGNOSIS — K219 Gastro-esophageal reflux disease without esophagitis: Secondary | ICD-10-CM | POA: Diagnosis present

## 2014-12-09 DIAGNOSIS — J438 Other emphysema: Secondary | ICD-10-CM | POA: Diagnosis not present

## 2014-12-09 DIAGNOSIS — M797 Fibromyalgia: Secondary | ICD-10-CM

## 2014-12-09 DIAGNOSIS — G4733 Obstructive sleep apnea (adult) (pediatric): Secondary | ICD-10-CM | POA: Diagnosis not present

## 2014-12-09 DIAGNOSIS — K838 Other specified diseases of biliary tract: Secondary | ICD-10-CM | POA: Diagnosis not present

## 2014-12-09 DIAGNOSIS — Z882 Allergy status to sulfonamides status: Secondary | ICD-10-CM | POA: Diagnosis not present

## 2014-12-09 DIAGNOSIS — M549 Dorsalgia, unspecified: Secondary | ICD-10-CM

## 2014-12-09 DIAGNOSIS — D696 Thrombocytopenia, unspecified: Secondary | ICD-10-CM

## 2014-12-09 DIAGNOSIS — I10 Essential (primary) hypertension: Secondary | ICD-10-CM | POA: Diagnosis not present

## 2014-12-09 DIAGNOSIS — N179 Acute kidney failure, unspecified: Secondary | ICD-10-CM | POA: Diagnosis not present

## 2014-12-09 DIAGNOSIS — Z7982 Long term (current) use of aspirin: Secondary | ICD-10-CM

## 2014-12-09 DIAGNOSIS — D860 Sarcoidosis of lung: Secondary | ICD-10-CM

## 2014-12-09 DIAGNOSIS — Z91040 Latex allergy status: Secondary | ICD-10-CM | POA: Diagnosis not present

## 2014-12-09 DIAGNOSIS — J810 Acute pulmonary edema: Secondary | ICD-10-CM

## 2014-12-09 DIAGNOSIS — E785 Hyperlipidemia, unspecified: Secondary | ICD-10-CM | POA: Diagnosis present

## 2014-12-09 DIAGNOSIS — I5033 Acute on chronic diastolic (congestive) heart failure: Secondary | ICD-10-CM

## 2014-12-09 DIAGNOSIS — Z791 Long term (current) use of non-steroidal anti-inflammatories (NSAID): Secondary | ICD-10-CM | POA: Diagnosis not present

## 2014-12-09 DIAGNOSIS — I5032 Chronic diastolic (congestive) heart failure: Secondary | ICD-10-CM | POA: Diagnosis not present

## 2014-12-09 DIAGNOSIS — F17210 Nicotine dependence, cigarettes, uncomplicated: Secondary | ICD-10-CM | POA: Diagnosis present

## 2014-12-09 DIAGNOSIS — E119 Type 2 diabetes mellitus without complications: Secondary | ICD-10-CM | POA: Diagnosis not present

## 2014-12-09 DIAGNOSIS — E876 Hypokalemia: Secondary | ICD-10-CM | POA: Diagnosis present

## 2014-12-09 DIAGNOSIS — Z8249 Family history of ischemic heart disease and other diseases of the circulatory system: Secondary | ICD-10-CM | POA: Diagnosis not present

## 2014-12-09 DIAGNOSIS — G8929 Other chronic pain: Secondary | ICD-10-CM | POA: Diagnosis present

## 2014-12-09 DIAGNOSIS — R0902 Hypoxemia: Secondary | ICD-10-CM

## 2014-12-09 DIAGNOSIS — J690 Pneumonitis due to inhalation of food and vomit: Secondary | ICD-10-CM | POA: Diagnosis not present

## 2014-12-09 DIAGNOSIS — E662 Morbid (severe) obesity with alveolar hypoventilation: Secondary | ICD-10-CM | POA: Diagnosis not present

## 2014-12-09 DIAGNOSIS — J9621 Acute and chronic respiratory failure with hypoxia: Secondary | ICD-10-CM

## 2014-12-09 DIAGNOSIS — Z87891 Personal history of nicotine dependence: Secondary | ICD-10-CM | POA: Diagnosis not present

## 2014-12-09 DIAGNOSIS — R0602 Shortness of breath: Secondary | ICD-10-CM | POA: Diagnosis not present

## 2014-12-09 DIAGNOSIS — J9622 Acute and chronic respiratory failure with hypercapnia: Secondary | ICD-10-CM | POA: Diagnosis present

## 2014-12-09 DIAGNOSIS — R0989 Other specified symptoms and signs involving the circulatory and respiratory systems: Secondary | ICD-10-CM | POA: Diagnosis not present

## 2014-12-09 DIAGNOSIS — J9690 Respiratory failure, unspecified, unspecified whether with hypoxia or hypercapnia: Secondary | ICD-10-CM

## 2014-12-09 DIAGNOSIS — R918 Other nonspecific abnormal finding of lung field: Secondary | ICD-10-CM | POA: Diagnosis not present

## 2014-12-09 DIAGNOSIS — E860 Dehydration: Secondary | ICD-10-CM

## 2014-12-09 DIAGNOSIS — E871 Hypo-osmolality and hyponatremia: Secondary | ICD-10-CM | POA: Diagnosis not present

## 2014-12-09 DIAGNOSIS — J9600 Acute respiratory failure, unspecified whether with hypoxia or hypercapnia: Secondary | ICD-10-CM | POA: Diagnosis not present

## 2014-12-09 DIAGNOSIS — J9620 Acute and chronic respiratory failure, unspecified whether with hypoxia or hypercapnia: Secondary | ICD-10-CM | POA: Diagnosis not present

## 2014-12-09 DIAGNOSIS — F411 Generalized anxiety disorder: Secondary | ICD-10-CM | POA: Diagnosis not present

## 2014-12-09 LAB — I-STAT TROPONIN, ED: TROPONIN I, POC: 0 ng/mL (ref 0.00–0.08)

## 2014-12-09 LAB — BASIC METABOLIC PANEL
Anion gap: 13 (ref 5–15)
BUN: 8 mg/dL (ref 6–23)
CHLORIDE: 89 mmol/L — AB (ref 96–112)
CO2: 29 mmol/L (ref 19–32)
Calcium: 8 mg/dL — ABNORMAL LOW (ref 8.4–10.5)
Creatinine, Ser: 1.06 mg/dL (ref 0.50–1.10)
GFR, EST AFRICAN AMERICAN: 71 mL/min — AB (ref >90)
GFR, EST NON AFRICAN AMERICAN: 62 mL/min — AB (ref >90)
Glucose, Bld: 301 mg/dL — ABNORMAL HIGH (ref 70–99)
POTASSIUM: 3.1 mmol/L — AB (ref 3.5–5.1)
Sodium: 131 mmol/L — ABNORMAL LOW (ref 135–145)

## 2014-12-09 LAB — BLOOD GAS, ARTERIAL
Acid-Base Excess: 5.3 mmol/L — ABNORMAL HIGH (ref 0.0–2.0)
BICARBONATE: 30.2 meq/L — AB (ref 20.0–24.0)
Drawn by: 307971
O2 Content: 4 L/min
O2 SAT: 94.4 %
Patient temperature: 98.6
TCO2: 27 mmol/L (ref 0–100)
pCO2 arterial: 47.4 mmHg — ABNORMAL HIGH (ref 35.0–45.0)
pH, Arterial: 7.42 (ref 7.350–7.450)
pO2, Arterial: 70 mmHg — ABNORMAL LOW (ref 80.0–100.0)

## 2014-12-09 LAB — CBC
HCT: 44.2 % (ref 36.0–46.0)
HEMOGLOBIN: 14.2 g/dL (ref 12.0–15.0)
MCH: 26.1 pg (ref 26.0–34.0)
MCHC: 32.1 g/dL (ref 30.0–36.0)
MCV: 81.1 fL (ref 78.0–100.0)
PLATELETS: 168 10*3/uL (ref 150–400)
RBC: 5.45 MIL/uL — ABNORMAL HIGH (ref 3.87–5.11)
RDW: 18.3 % — AB (ref 11.5–15.5)
WBC: 6.7 10*3/uL (ref 4.0–10.5)

## 2014-12-09 LAB — CBG MONITORING, ED: Glucose-Capillary: 238 mg/dL — ABNORMAL HIGH (ref 70–99)

## 2014-12-09 LAB — I-STAT CG4 LACTIC ACID, ED: Lactic Acid, Venous: 2.51 mmol/L (ref 0.5–2.0)

## 2014-12-09 LAB — GLUCOSE, CAPILLARY
GLUCOSE-CAPILLARY: 382 mg/dL — AB (ref 70–99)
Glucose-Capillary: 328 mg/dL — ABNORMAL HIGH (ref 70–99)

## 2014-12-09 LAB — BRAIN NATRIURETIC PEPTIDE: B NATRIURETIC PEPTIDE 5: 31 pg/mL (ref 0.0–100.0)

## 2014-12-09 MED ORDER — POTASSIUM CHLORIDE CRYS ER 20 MEQ PO TBCR
40.0000 meq | EXTENDED_RELEASE_TABLET | Freq: Once | ORAL | Status: AC
  Administered 2014-12-09: 40 meq via ORAL
  Filled 2014-12-09: qty 2

## 2014-12-09 MED ORDER — OXYCODONE-ACETAMINOPHEN 5-325 MG PO TABS
2.0000 | ORAL_TABLET | Freq: Once | ORAL | Status: AC
  Administered 2014-12-09: 2 via ORAL
  Filled 2014-12-09: qty 2

## 2014-12-09 MED ORDER — INSULIN ASPART 100 UNIT/ML ~~LOC~~ SOLN
0.0000 [IU] | Freq: Every day | SUBCUTANEOUS | Status: DC
  Administered 2014-12-09 – 2014-12-10 (×2): 4 [IU] via SUBCUTANEOUS
  Administered 2014-12-12 – 2014-12-13 (×2): 3 [IU] via SUBCUTANEOUS
  Administered 2014-12-14: 5 [IU] via SUBCUTANEOUS
  Administered 2014-12-15 – 2014-12-16 (×2): 2 [IU] via SUBCUTANEOUS

## 2014-12-09 MED ORDER — INSULIN ASPART 100 UNIT/ML ~~LOC~~ SOLN
0.0000 [IU] | Freq: Three times a day (TID) | SUBCUTANEOUS | Status: DC
  Administered 2014-12-09: 15 [IU] via SUBCUTANEOUS
  Administered 2014-12-09: 7 [IU] via SUBCUTANEOUS
  Administered 2014-12-10 (×2): 11 [IU] via SUBCUTANEOUS
  Administered 2014-12-10: 7 [IU] via SUBCUTANEOUS
  Administered 2014-12-11: 11 [IU] via SUBCUTANEOUS
  Administered 2014-12-11 (×2): 15 [IU] via SUBCUTANEOUS
  Administered 2014-12-12: 20 [IU] via SUBCUTANEOUS
  Administered 2014-12-12: 11 [IU] via SUBCUTANEOUS
  Administered 2014-12-12: 7 [IU] via SUBCUTANEOUS
  Administered 2014-12-13: 3 [IU] via SUBCUTANEOUS
  Administered 2014-12-13: 7 [IU] via SUBCUTANEOUS
  Administered 2014-12-13: 15 [IU] via SUBCUTANEOUS
  Administered 2014-12-14: 3 [IU] via SUBCUTANEOUS
  Administered 2014-12-14: 15 [IU] via SUBCUTANEOUS
  Administered 2014-12-15: 7 [IU] via SUBCUTANEOUS
  Administered 2014-12-15: 4 [IU] via SUBCUTANEOUS
  Administered 2014-12-15: 7 [IU] via SUBCUTANEOUS
  Administered 2014-12-16: 4 [IU] via SUBCUTANEOUS
  Administered 2014-12-16: 7 [IU] via SUBCUTANEOUS
  Administered 2014-12-17: 4 [IU] via SUBCUTANEOUS
  Administered 2014-12-17: 3 [IU] via SUBCUTANEOUS
  Administered 2014-12-18: 7 [IU] via SUBCUTANEOUS
  Administered 2014-12-18: 4 [IU] via SUBCUTANEOUS

## 2014-12-09 MED ORDER — POTASSIUM CHLORIDE CRYS ER 20 MEQ PO TBCR
40.0000 meq | EXTENDED_RELEASE_TABLET | ORAL | Status: AC
  Administered 2014-12-09: 40 meq via ORAL
  Filled 2014-12-09: qty 2

## 2014-12-09 MED ORDER — OXYCODONE-ACETAMINOPHEN 5-325 MG PO TABS
1.0000 | ORAL_TABLET | ORAL | Status: DC | PRN
  Administered 2014-12-10: 1 via ORAL
  Filled 2014-12-09: qty 1

## 2014-12-09 MED ORDER — NAPROXEN SODIUM 550 MG PO TABS
550.0000 mg | ORAL_TABLET | Freq: Two times a day (BID) | ORAL | Status: DC | PRN
  Filled 2014-12-09: qty 2
  Filled 2014-12-09: qty 1

## 2014-12-09 MED ORDER — HYDROXYZINE HCL 10 MG PO TABS
10.0000 mg | ORAL_TABLET | Freq: Three times a day (TID) | ORAL | Status: DC | PRN
  Filled 2014-12-09: qty 1

## 2014-12-09 MED ORDER — ALBUTEROL SULFATE (2.5 MG/3ML) 0.083% IN NEBU
2.5000 mg | INHALATION_SOLUTION | RESPIRATORY_TRACT | Status: DC | PRN
Start: 2014-12-09 — End: 2014-12-10
  Administered 2014-12-10 (×2): 2.5 mg via RESPIRATORY_TRACT
  Filled 2014-12-09 (×2): qty 3

## 2014-12-09 MED ORDER — POTASSIUM CHLORIDE IN NACL 20-0.9 MEQ/L-% IV SOLN
INTRAVENOUS | Status: DC
  Administered 2014-12-09 – 2014-12-10 (×2): via INTRAVENOUS
  Filled 2014-12-09 (×2): qty 1000

## 2014-12-09 MED ORDER — INSULIN ASPART 100 UNIT/ML ~~LOC~~ SOLN
6.0000 [IU] | Freq: Three times a day (TID) | SUBCUTANEOUS | Status: DC
Start: 2014-12-09 — End: 2014-12-10
  Administered 2014-12-10: 6 [IU] via SUBCUTANEOUS

## 2014-12-09 MED ORDER — VENLAFAXINE HCL ER 37.5 MG PO CP24
37.5000 mg | ORAL_CAPSULE | Freq: Every day | ORAL | Status: DC
  Administered 2014-12-09 – 2014-12-18 (×10): 37.5 mg via ORAL
  Filled 2014-12-09 (×13): qty 1

## 2014-12-09 MED ORDER — POTASSIUM CHLORIDE CRYS ER 20 MEQ PO TBCR
40.0000 meq | EXTENDED_RELEASE_TABLET | Freq: Two times a day (BID) | ORAL | Status: DC
  Administered 2014-12-09: 40 meq via ORAL
  Filled 2014-12-09 (×3): qty 2

## 2014-12-09 MED ORDER — INSULIN ASPART 100 UNIT/ML ~~LOC~~ SOLN
0.0000 [IU] | Freq: Three times a day (TID) | SUBCUTANEOUS | Status: DC
Start: 2014-12-09 — End: 2014-12-09
  Filled 2014-12-09: qty 1

## 2014-12-09 MED ORDER — METHYLPREDNISOLONE SODIUM SUCC 125 MG IJ SOLR
125.0000 mg | Freq: Once | INTRAMUSCULAR | Status: AC
  Administered 2014-12-09: 125 mg via INTRAVENOUS
  Filled 2014-12-09: qty 2

## 2014-12-09 MED ORDER — BUDESONIDE-FORMOTEROL FUMARATE 160-4.5 MCG/ACT IN AERO
2.0000 | INHALATION_SPRAY | Freq: Two times a day (BID) | RESPIRATORY_TRACT | Status: DC
  Administered 2014-12-09 – 2014-12-18 (×17): 2 via RESPIRATORY_TRACT
  Filled 2014-12-09: qty 6

## 2014-12-09 MED ORDER — SODIUM CHLORIDE 0.9 % IV BOLUS (SEPSIS): 1000.0000 mL | Freq: Once | INTRAVENOUS | Status: DC

## 2014-12-09 MED ORDER — TIOTROPIUM BROMIDE MONOHYDRATE 18 MCG IN CAPS
18.0000 ug | ORAL_CAPSULE | Freq: Every day | RESPIRATORY_TRACT | Status: DC
  Administered 2014-12-10 – 2014-12-18 (×9): 18 ug via RESPIRATORY_TRACT
  Filled 2014-12-09 (×2): qty 5

## 2014-12-09 MED ORDER — ALPRAZOLAM 1 MG PO TABS
1.0000 mg | ORAL_TABLET | Freq: Three times a day (TID) | ORAL | Status: DC
  Administered 2014-12-09 – 2014-12-18 (×26): 1 mg via ORAL
  Filled 2014-12-09 (×26): qty 1

## 2014-12-09 MED ORDER — IPRATROPIUM-ALBUTEROL 0.5-2.5 (3) MG/3ML IN SOLN
3.0000 mL | Freq: Once | RESPIRATORY_TRACT | Status: AC
  Administered 2014-12-09: 3 mL via RESPIRATORY_TRACT

## 2014-12-09 MED ORDER — MORPHINE SULFATE 4 MG/ML IJ SOLN
4.0000 mg | Freq: Once | INTRAMUSCULAR | Status: AC
  Administered 2014-12-09: 4 mg via INTRAVENOUS
  Filled 2014-12-09: qty 1

## 2014-12-09 MED ORDER — ALPRAZOLAM 0.5 MG PO TABS
1.0000 mg | ORAL_TABLET | Freq: Once | ORAL | Status: AC
  Administered 2014-12-09: 1 mg via ORAL
  Filled 2014-12-09: qty 2

## 2014-12-09 MED ORDER — ENOXAPARIN SODIUM 80 MG/0.8ML ~~LOC~~ SOLN
65.0000 mg | SUBCUTANEOUS | Status: DC
  Administered 2014-12-09 – 2014-12-14 (×6): 65 mg via SUBCUTANEOUS
  Filled 2014-12-09 (×7): qty 0.8

## 2014-12-09 MED ORDER — VARENICLINE TARTRATE 0.5 MG PO TABS
0.5000 mg | ORAL_TABLET | Freq: Two times a day (BID) | ORAL | Status: DC
  Administered 2014-12-09 – 2014-12-18 (×18): 0.5 mg via ORAL
  Filled 2014-12-09 (×22): qty 1

## 2014-12-09 MED ORDER — FLUTICASONE PROPIONATE 50 MCG/ACT NA SUSP
2.0000 | Freq: Every day | NASAL | Status: DC
  Administered 2014-12-09 – 2014-12-18 (×10): 2 via NASAL
  Filled 2014-12-09: qty 16

## 2014-12-09 MED ORDER — INSULIN GLARGINE 100 UNIT/ML ~~LOC~~ SOLN
30.0000 [IU] | Freq: Every day | SUBCUTANEOUS | Status: DC
  Administered 2014-12-09: 30 [IU] via SUBCUTANEOUS
  Filled 2014-12-09: qty 0.3

## 2014-12-09 MED ORDER — ASPIRIN EC 81 MG PO TBEC
81.0000 mg | DELAYED_RELEASE_TABLET | Freq: Every day | ORAL | Status: DC
  Administered 2014-12-09 – 2014-12-18 (×10): 81 mg via ORAL
  Filled 2014-12-09 (×11): qty 1

## 2014-12-09 MED ORDER — SODIUM CHLORIDE 0.9 % IV BOLUS (SEPSIS)
1000.0000 mL | Freq: Once | INTRAVENOUS | Status: AC
  Administered 2014-12-09: 1000 mL via INTRAVENOUS

## 2014-12-09 MED ORDER — TIZANIDINE HCL 4 MG PO TABS
4.0000 mg | ORAL_TABLET | Freq: Three times a day (TID) | ORAL | Status: DC | PRN
  Administered 2014-12-09 – 2014-12-11 (×2): 4 mg via ORAL
  Administered 2014-12-12: 2 mg via ORAL
  Administered 2014-12-13: via ORAL
  Filled 2014-12-09 (×7): qty 1

## 2014-12-09 MED ORDER — GABAPENTIN 300 MG PO CAPS
300.0000 mg | ORAL_CAPSULE | Freq: Every day | ORAL | Status: DC
  Administered 2014-12-09 – 2014-12-17 (×9): 300 mg via ORAL
  Filled 2014-12-09 (×10): qty 1

## 2014-12-09 MED ORDER — PANTOPRAZOLE SODIUM 40 MG PO TBEC
40.0000 mg | DELAYED_RELEASE_TABLET | Freq: Every day | ORAL | Status: DC
Start: 2014-12-09 — End: 2014-12-18
  Administered 2014-12-10 – 2014-12-18 (×9): 40 mg via ORAL
  Filled 2014-12-09 (×10): qty 1

## 2014-12-09 MED ORDER — LEVOFLOXACIN 750 MG PO TABS
750.0000 mg | ORAL_TABLET | Freq: Every day | ORAL | Status: DC
  Administered 2014-12-10 – 2014-12-13 (×4): 750 mg via ORAL
  Filled 2014-12-09 (×5): qty 1

## 2014-12-09 MED ORDER — IPRATROPIUM-ALBUTEROL 0.5-2.5 (3) MG/3ML IN SOLN
3.0000 mL | RESPIRATORY_TRACT | Status: DC | PRN
  Filled 2014-12-09: qty 3

## 2014-12-09 MED ORDER — LEVOFLOXACIN IN D5W 750 MG/150ML IV SOLN
750.0000 mg | Freq: Once | INTRAVENOUS | Status: AC
  Administered 2014-12-09: 750 mg via INTRAVENOUS
  Filled 2014-12-09: qty 150

## 2014-12-09 MED ORDER — GLIMEPIRIDE 1 MG PO TABS
1.0000 mg | ORAL_TABLET | Freq: Every day | ORAL | Status: DC
  Administered 2014-12-09 – 2014-12-18 (×10): 1 mg via ORAL
  Filled 2014-12-09 (×12): qty 1

## 2014-12-09 NOTE — Progress Notes (Signed)
Spoke with pt regarding cpap.  Pt stated she doesn't want to wear it tonight.  Pt stated she is too anxious and does not want anything over her face.  Pt remains on 4lnc.  Pt was advised that RT is available all night should she change her mind.

## 2014-12-09 NOTE — ED Notes (Signed)
Patient sates at this time that she is "not happy with service" she has not been offered restroom or asked if she was hungry since she was here at  2am" [facility] has asked if there is anything that tthey can do to help or what I am able to do, at this time patient is ready to be transported to [facility]

## 2014-12-09 NOTE — ED Notes (Signed)
Per EMS, pt from home, reports SOB, pt wears home O2 [place] 3L.  Hx of COPD and CHF.  Ran out of her symbycort yesterday.  Pt has been feeling anxious without her inhaler.

## 2014-12-09 NOTE — ED Notes (Signed)
Hospital phlebotomy called to draw [facility]

## 2014-12-09 NOTE — ED Provider Notes (Signed)
CSN: [id number]     Arrival date & time 12/09/14  0258 History   None    Chief Complaint  Patient presents with  . Shortness of Breath     (Consider location/radiation/quality/duration/timing/severity/associated sxs/prior Treatment) Patient is a 49 y.o. female presenting with shortness of breath. The history is provided by the patient. No language interpreter was used.  Shortness of Breath Associated symptoms: no fever and no sore throat   [name] is a 49 y.o black female who has a history of diabetes, COPD and CHF and presents today for difficulty breathing since 10am yesterday and worse with exertion.  She says she went to vote yesterday and felt more out of breath than usual and had to turn up her oxygen to 8L. She is on 4L of oxygen at home during the day and at night. She had 3 neb treatments and used her inhaler within the last 18 hours with minimal relief. She describes the pain as a chest tightness and pressure. She is also complaining of increased swelling in her feet. She denies any chest pain, abdominal pain, nausea, or vomiting.   Past Medical History  Diagnosis Date  . Hypertension   . Hyperlipidemia   . Chronic headache   . Fibromyalgia     daily narcotics  . Anxiety     hx chronic BZ use, stopped 07/2010  . Anemia   . Pulmonary sarcoidosis     unimpressive CT chest 2011  . Colonic polyp   . GERD (gastroesophageal reflux disease)   . ALLERGIC RHINITIS   . Asthma   . CHF (congestive heart failure)     Diastolic with fluid overload, May, 2012, LVEF 60%  . Morbid obesity   . Depression   . Panic attacks   . Diabetes mellitus   . COPD (chronic obstructive pulmonary disease)     on home O2, moderate airflow obstruction, suspect d/t emphysema  . Obesity   . Elevated LFTs 09/2011  . Ovarian cyst   . Chronic back pain   . Sleep apnea      CPAP  . Fatty liver   . Esophagitis   . Gastritis   . Internal hemorrhoids   . Adenomatous colon polyp 12/03/09    [facility] in [place], [place]   Past Surgical History  Procedure Laterality Date  . Polypectomy  2011  . Lumbar microdiscectomy  07/06/2011    R L4-5, stern  . Back surgery    . Carpal tunnel release    . Steroid spinal injections    . Hysteroscopy w/d&c N/A 03/25/2013    Procedure: DILATATION AND CURETTAGE /HYSTEROSCOPY;  Surgeon: [name], MD;  Location: [facility];  Service: Gynecology;  Laterality: N/A;  . Dilation and curettage of uterus     Family History  Problem Relation Age of Onset  . Hypertension Mother   . Emphysema Father   . Hypertension Father   . Stomach cancer Father   . Allergies Brother   . Hypertension Brother   . Stomach cancer Brother   . Heart disease Mother   . Heart disease Father   . Heart disease Brother     died age 74 sudden death/MI   History  Substance Use Topics  . Smoking status: Former Smoker -- 0.25 packs/day for 29 years    Types: Cigarettes    Quit date: 10/26/2014  . Smokeless tobacco: Never Used  . Alcohol Use: 0.0 oz/week    0 Standard drinks or equivalent per  week     Comment: occasionally   OB History    No data available     Review of Systems  Constitutional: Negative for fever.  HENT: Negative for sore throat.   Respiratory: Positive for shortness of breath.   All other systems reviewed and are negative.     Allergies  Simvastatin; Sulfamethoxazole-trimethoprim; Zolpidem tartrate; Azithromycin; Doxycycline; Latex; Metformin and related; Metronidazole; Ceftin; Metolazone; Ciprofloxacin; and Tramadol  Home Medications   Prior to Admission medications   Medication Sig Start Date End Date Taking? Authorizing Provider  acetaminophen (TYLENOL) 325 MG tablet Take 1,300 mg by mouth every 6 (six) hours as needed.   Yes Historical Provider, MD  albuterol (PROVENTIL) (2.5 MG/3ML) 0.083% nebulizer solution Take 3 mLs (2.5 mg total) by nebulization every 6 (six) hours as needed for wheezing or shortness of breath. 12/08/14  Yes  Rowe Clack, MD  ALPRAZolam Duanne Moron) 1 MG tablet Take 1 mg by mouth 3 (three) times daily.   Yes Historical Provider, MD  aspirin EC 81 MG tablet Take 81 mg by mouth daily.   Yes Historical Provider, MD  BIOTIN PO Take 1 capsule by mouth daily.   Yes Historical Provider, MD  CAPSAICIN EX Apply 1 application topically as needed (arthritis pain).   Yes Historical Provider, MD  cetirizine (ZYRTEC) 1 MG/ML syrup TAKE 10 ML BY MOUTH EVERY NIGHT AT BEDTIME 09/07/14  Yes Rowe Clack, MD  Coenzyme Q10 (CO Q 10) 10 MG CAPS Take 1 capsule by mouth daily.   Yes Historical Provider, MD  esomeprazole (NEXIUM) 40 MG capsule Take 40 mg by mouth 2 (two) times daily before a meal.   Yes Historical Provider, MD  fluticasone (FLONASE) 50 MCG/ACT nasal spray Place 2 sprays into both nostrils daily. 10/19/14  Yes Rowe Clack, MD  gabapentin (NEURONTIN) 300 MG capsule Take 300 mg by mouth at bedtime.   Yes Historical Provider, MD  glimepiride (AMARYL) 1 MG tablet Take 1 mg by mouth daily with breakfast.   Yes Historical Provider, MD  hydrOXYzine (ATARAX/VISTARIL) 10 MG tablet TAKE 1 TABLET BY MOUTH THREE TIMES DAILY AS NEEDED FOR ITCHING 08/27/14  Yes Biagio Borg, MD  ibuprofen (ADVIL,MOTRIN) 200 MG tablet Take 800 mg by mouth every 6 (six) hours as needed for moderate pain.   Yes Historical Provider, MD  insulin aspart (NOVOLOG FLEXPEN) 100 UNIT/ML FlexPen Use as directed per sliding scale indicated by your physician. Patient taking differently: Inject 1-11 Units into the skin 3 (three) times daily with meals. Use as directed per sliding scale indicated by your physician. 09/22/14  Yes Rowe Clack, MD  Insulin Glargine (LANTUS SOLOSTAR) 100 UNIT/ML Solostar Pen Inject 16 Units into the skin daily at 10 pm. Patient taking differently: Inject 30 Units into the skin 2 (two) times daily.  11/27/14  Yes Rowe Clack, MD  Multiple Vitamin (MULTIVITAMIN) capsule Take 1 capsule by mouth daily.    Yes Historical Provider, MD  naproxen sodium (ANAPROX) 220 MG tablet Take 440 mg by mouth 2 (two) times daily as needed (pain).   Yes Historical Provider, MD  omega-3 fish oil (MAXEPA) 1000 MG CAPS capsule Take 1 capsule by mouth daily.   Yes Historical Provider, MD  potassium chloride SA (K-DUR,KLOR-CON) 20 MEQ tablet Take 2 tablets (40 mEq total) by mouth 2 (two) times daily. 11/13/14  Yes Rowe Clack, MD  PROAIR HFA 108 (90 BASE) MCG/ACT inhaler INHALE 2 PUFFS BY MOUTH EVERY 6 HOURS AS  NEEDED 08/28/14  Yes Rowe Clack, MD  SPIRIVA HANDIHALER 18 MCG inhalation capsule INHALE CONTENTS OF 1 CAPSULE USING HANDIHALER EVERY DAY 08/24/14  Yes Kathee Delton, MD  SYMBICORT 160-4.5 MCG/ACT inhaler INHALE 2 PUFFS BY MOUTH TWICE DAILY 10/19/14  Yes Kathee Delton, MD  tiZANidine (ZANAFLEX) 4 MG tablet TAKE 1 TABLET BY MOUTH EVERY 8 HOURS AS NEEDED FOR MUSCLE SPASMS 11/10/14  Yes Rowe Clack, MD  torsemide (DEMADEX) 100 MG tablet Take 1 tablet (100 mg total) by mouth 2 (two) times daily. 07/30/14  Yes Biagio Borg, MD  varenicline (CHANTIX) 0.5 MG tablet Take 1 tablet (0.5 mg total) by mouth 2 (two) times daily. 11/03/14  Yes Golden Circle, FNP  venlafaxine XR (EFFEXOR XR) 37.5 MG 24 hr capsule Take 1 capsule (37.5 mg total) by mouth daily with breakfast. 11/13/14  Yes Rowe Clack, MD  acidophilus (RISAQUAD) CAPS capsule Take 1 capsule by mouth daily. Patient not taking: Reported on 12/06/2014 11/30/14   Rowe Clack, MD  Insulin Pen Needle 31G X 8 MM MISC Use to administer insulin (Solostar Pen). 09/28/14   Rowe Clack, MD  ONE TOUCH ULTRA TEST test strip  11/01/14   Historical Provider, MD   BP 114/63 mmHg  Pulse 88  Temp(Src) 98.7 F (37.1 C) (Oral)  Resp 20  Ht 5\' 8"  (1.727 m)  Wt 296 lb (134.265 kg)  BMI 45.02 kg/m2  SpO2 100%  LMP 10/16/2012 Physical Exam  Constitutional: She is oriented to person, place, and time. She appears well-developed and well-nourished.   Obese  HENT:  Head: Normocephalic and atraumatic.  Mouth/Throat: Oropharynx is clear and moist.  Eyes: Conjunctivae and EOM are normal.  Neck: Normal range of motion. Neck supple.  Cardiovascular: Regular rhythm.  Tachycardia present.   Pulmonary/Chest:  She is on 4L of oxygen at bedside with 97% saturation.  I could not hear any wheezing or rales on exam.   Abdominal: Soft. There is no tenderness.  Musculoskeletal: Normal range of motion.  Bilateral 1+ pitting edema in the lower extremities.  Neurological: She is alert and oriented to person, place, and time.  Skin: Skin is warm and dry.  Nursing note and vitals reviewed.   ED Course  Procedures (including critical care time) Labs Review Labs Reviewed  BASIC METABOLIC PANEL - Abnormal; Notable for the following:    Sodium 131 (*)    Potassium 3.1 (*)    Chloride 89 (*)    Glucose, Bld 301 (*)    Calcium 8.0 (*)    GFR calc non Af Amer 62 (*)    GFR calc Af Amer 71 (*)    All other components within normal limits  CBC - Abnormal; Notable for the following:    RBC 5.45 (*)    RDW 18.3 (*)    All other components within normal limits  BLOOD GAS, ARTERIAL - Abnormal; Notable for the following:    pCO2 arterial 47.4 (*)    pO2, Arterial 70.0 (*)    Bicarbonate 30.2 (*)    Acid-Base Excess 5.3 (*)    All other components within normal limits  I-STAT CG4 LACTIC ACID, ED - Abnormal; Notable for the following:    Lactic Acid, Venous 2.51 (*)    All other components within normal limits  CBG MONITORING, ED - Abnormal; Notable for the following:    Glucose-Capillary 238 (*)    All other components within normal limits  CULTURE, BLOOD (ROUTINE  X 2)  CULTURE, BLOOD (ROUTINE X 2)  BRAIN NATRIURETIC PEPTIDE  BASIC METABOLIC PANEL  I-STAT TROPOININ, ED    Imaging Review Dg Chest 2 View (if Patient Has Fever And/or Copd)  12/09/2014   CLINICAL DATA:  Acute onset of severe shortness of breath. Initial encounter.  EXAM: CHEST   2 VIEW  COMPARISON:  Chest radiograph performed 10/31/2014  FINDINGS: The lungs are well-aerated. Vascular congestion is noted. Bilateral central airspace opacities raise concern for pulmonary edema, superimposed on chronic lung changes as previously noted. Mild pneumonia might have a similar appearance. There is no evidence of pleural effusion or pneumothorax.  The heart is borderline normal in size. No acute osseous abnormalities are seen.  IMPRESSION: Vascular congestion noted. Bilateral central airspace opacities raise concern for mild pulmonary edema, superimposed on chronic lung changes. Mild pneumonia might have a similar appearance.   Electronically Signed   By: Garald Balding M.D.   On: 12/09/2014 04:22     EKG Interpretation   Date/Time:  Wednesday December 09 2014 03:17:17 EDT Ventricular Rate:  99 PR Interval:  179 QRS Duration: 89 QT Interval:  362 QTC Calculation: 464 R Axis:   31 Text Interpretation:  Normal sinus rhythm No significant change since last  tracing Confirmed by GOLDSTON  MD, SCOTT ([id number]) on 12/09/2014 7:44:38 AM      MDM   Final diagnoses:  Community acquired pneumonia   Patient has a history of DM, COPD, and CHF.  She is complaining of increased shortness of breath on exertion.  Her chest xray shows mild pulmonary edema and pneumonia. Her potassium is 3.1. Lactic acid is 2.51.  She is afebrile and no elevation of WBC. Her CBG is 238.  We have started levaquin, steroids and duoneb.  She will need admission for increased difficulty breathing and pneumonia with her history of COPD and CHF.   I discussed this patient with Dr. Regenia Skeeter who spoke to the hospitalist regarding admission.   Ottie Glazier, PA-C 12/09/14 [place], MD 12/10/14 [phone]

## 2014-12-09 NOTE — H&P (Signed)
Hospital Admission Note Date: 12/09/2014  Patient name: [name] Medical record number: [id number] Date of birth: 09-Jun-1966 Age: 49 y.o. Gender: female PCP: Gwendolyn Grant, MD  Attending physician: Leana Gamer, MD  Chief Complaint:SOB x 3 days  History of Present Illness:Pt is a 49 y/o mobidly obese female with h/o DM II, COPD and chronic diastolic CHF.  She presents with  3-5 day history of SOB, pleuritic pain and what appears to be air hunger. She is chronically on 4 l/min of Oxygen but in the last several days has increased her Oxygen to 8 L/min. She has many non-specific complaints that are consisitent with anxiety. However in the ED she was found to be hypotensive, and hypokalemic and SOB. Although she is not having an increased oxygen requirement her CXR is suggestive of mild pneumonia among other etiologies of the x-ray findings in a setting of c/o pleuritic pain.  Pt is usually hypertensive at 140/80's and she had BP's as low as 87/355 in the ED, however she denies dizziness. She is being admitted for observation for the Hypotension and SOB.  Scheduled Meds: . ALPRAZolam  1 mg Oral Once   Continuous Infusions: . levofloxacin (LEVAQUIN) IV 750 mg (12/09/14 1049)   PRN Meds:.ipratropium-albuterol Allergies: Simvastatin; Sulfamethoxazole-trimethoprim; Zolpidem tartrate; Azithromycin; Doxycycline; Latex; Metformin and related; Metronidazole; Ceftin; Metolazone; Ciprofloxacin; and Tramadol Past Medical History  Diagnosis Date  . Hypertension   . Hyperlipidemia   . Chronic headache   . Fibromyalgia     daily narcotics  . Anxiety     hx chronic BZ use, stopped 07/2010  . Anemia   . Pulmonary sarcoidosis     unimpressive CT chest 2011  . Colonic polyp   . GERD (gastroesophageal reflux disease)   . ALLERGIC RHINITIS   . Asthma   . CHF (congestive heart failure)     Diastolic with fluid overload, May, 2012, LVEF 60%  . Morbid obesity   . Depression   . Panic  attacks   . Diabetes mellitus   . COPD (chronic obstructive pulmonary disease)     on home O2, moderate airflow obstruction, suspect d/t emphysema  . Obesity   . Elevated LFTs 09/2011  . Ovarian cyst   . Chronic back pain   . Sleep apnea      CPAP  . Fatty liver   . Esophagitis   . Gastritis   . Internal hemorrhoids   . Adenomatous colon polyp 12/03/09    [facility] in [place], [place]   Past Surgical History  Procedure Laterality Date  . Polypectomy  2011  . Lumbar microdiscectomy  07/06/2011    R L4-5, stern  . Back surgery    . Carpal tunnel release    . Steroid spinal injections    . Hysteroscopy w/d&c N/A 03/25/2013    Procedure: DILATATION AND CURETTAGE /HYSTEROSCOPY;  Surgeon: [name], MD;  Location: [facility];  Service: Gynecology;  Laterality: N/A;  . Dilation and curettage of uterus     Family History  Problem Relation Age of Onset  . Hypertension Mother   . Emphysema Father   . Hypertension Father   . Stomach cancer Father   . Allergies Brother   . Hypertension Brother   . Stomach cancer Brother   . Heart disease Mother   . Heart disease Father   . Heart disease Brother     died age 20 sudden death/MI   History   Social History  . Marital Status: Legally Separated  Spouse Name: N/A  . Number of Children: N/A  . Years of Education: N/A   Occupational History  . Not on file.   Social History Main Topics  . Smoking status: Former Smoker -- 0.25 packs/day for 29 years    Types: Cigarettes    Quit date: 10/26/2014  . Smokeless tobacco: Never Used  . Alcohol Use: 0.0 oz/week    0 Standard drinks or equivalent per week     Comment: occasionally  . Drug Use: Yes    Special: Cocaine, Marijuana     Comment: remote cocaine; current marijuana  . Sexual Activity: Not on file   Other Topics Concern  . Not on file   Social History Narrative   Pt is single. No children, and disaability   Review of Systems: A comprehensive review of systems was  negative except as noted in the HPI. Physical Exam: No intake or output data in the 24 hours ending 12/09/14 1218 General:  Well appearing morbidly obese female who is alert, awake, oriented x3, in no acute distress.  HEENT: [place]/AT PEERL, EOMI Neck: Trachea midline,  no masses, no thyromegal,y no JVD, no carotid bruit OROPHARYNX:  Mucosa dry. No exudate/ erythema/lesions.  Heart: Regular rate and rhythm, without murmurs, rubs, gallops, PMI non-displaced, no heaves or thrills on palpation.  Lungs: Clear to auscultation, no wheezing or rhonchi noted. No increased vocal fremitus resonant to percussion  Abdomen: Obese soft, nontender, nondistended, positive bowel sounds, no masses no hepatosplenomegaly noted.  Neuro: No focal neurological deficits noted cranial nerves II through XII grossly intact.  Strength at baseline in bilateral upper and lower extremities. Musculoskeletal: No warm swelling or erythema around joints, no spinal tenderness noted. Skin: Ashy with decreased skin turgor. Psychiatric: Patient alert and oriented x3, good insight and cognition, good recent to remote recall.   Lab results:  Recent Labs  12/06/14 1311 12/09/14 0330  NA 136 131*  K 2.9* 3.1*  CL 91* 89*  CO2 30 29  GLUCOSE 267* 301*  BUN 8 8  CREATININE 0.83 1.06  CALCIUM 7.9* 8.0*   No results for input(s): AST, ALT, ALKPHOS, BILITOT, PROT, ALBUMIN in the last 72 hours. No results for input(s): LIPASE, AMYLASE in the last 72 hours.  Recent Labs  12/06/14 1311 12/09/14 0330  WBC 5.4 6.7  HGB 13.2 14.2  HCT 41.5 44.2  MCV 79.2 81.1  PLT 152 168   No results for input(s): CKTOTAL, CKMB, CKMBINDEX, TROPONINI in the last 72 hours. Invalid input(s): POCBNP No results for input(s): DDIMER in the last 72 hours. No results for input(s): HGBA1C in the last 72 hours. No results for input(s): CHOL, HDL, LDLCALC, TRIG, CHOLHDL, LDLDIRECT in the last 72 hours. No results for input(s): TSH, T4TOTAL, T3FREE,  THYROIDAB in the last 72 hours.  Invalid input(s): FREET3 No results for input(s): VITAMINB12, FOLATE, FERRITIN, TIBC, IRON, RETICCTPCT in the last 72 hours. Imaging results:  Dg Chest 2 View (if Patient Has Fever And/or Copd)  12/09/2014   CLINICAL DATA:  Acute onset of severe shortness of breath. Initial encounter.  EXAM: CHEST  2 VIEW  COMPARISON:  Chest radiograph performed 10/31/2014  FINDINGS: The lungs are well-aerated. Vascular congestion is noted. Bilateral central airspace opacities raise concern for pulmonary edema, superimposed on chronic lung changes as previously noted. Mild pneumonia might have a similar appearance. There is no evidence of pleural effusion or pneumothorax.  The heart is borderline normal in size. No acute osseous abnormalities are seen.  IMPRESSION:  Vascular congestion noted. Bilateral central airspace opacities raise concern for mild pulmonary edema, superimposed on chronic lung changes. Mild pneumonia might have a similar appearance.   Electronically Signed   By: Garald Balding M.D.   On: 12/09/2014 04:22   Other results: EKG: normal EKG, normal sinus rhythm, unchanged from previous tracings, normal sinus rhythm.  Assessment and Plan 1. Hypotension: My assessment is that the patient has had poor oral intake and has continued to take her diuretics. This has led to a mild clinical dehydration. I will rehydrate gently and hold Torsemide for today. Check I&O's and re-assess hydration status in the morning.  2. Community Acquired Pneumonia: The patient is hypoxic (on ABG) without any significant bronchospasm. I will treat as a Pneumonia and treat with Levaquin. However I do not feel that her severity of illness warrants IV route. Thus I will treat with Levaquin 750 mg daily.  (Pt has multiple allergies)  3. Hypokalemia: Check Magnesium levels and replace Potassium orally.  4. Mild Dehydration: Likely a result of Diuretics. Rehydrate.Hold Torsemide.  5. Chronic  Diastolic Dysfunction: Most recent ECHO shows normal systolic function and grade 1 diastolic dysfunction. She apparently was on Spironolactone as well as Torsemide. She reports that she was told by her Physician to discontinue the Spironolactone which has been done on this admission. However in light of her Dehydration and hypotension. I will hold Torsemide for now.  6. DM II: Will continue Lantus. Pt was prescribed 16 units of Lantus, bu thas been taking 30 units BID???. I will start with 30 units daily and SSI. She is also on Amaryl and will continue for pre-meal coverage.  7. Anxiety: Pt has a history of anxiety and is on Xanax and Effexor. Will continue durign hospitalization.   Total time spent 1 hour.  Corliss Lamartina A. 12/09/2014, 12:18 PM

## 2014-12-10 DIAGNOSIS — R0602 Shortness of breath: Secondary | ICD-10-CM | POA: Diagnosis not present

## 2014-12-10 DIAGNOSIS — I5032 Chronic diastolic (congestive) heart failure: Secondary | ICD-10-CM | POA: Diagnosis not present

## 2014-12-10 DIAGNOSIS — J42 Unspecified chronic bronchitis: Secondary | ICD-10-CM | POA: Diagnosis not present

## 2014-12-10 DIAGNOSIS — J9622 Acute and chronic respiratory failure with hypercapnia: Secondary | ICD-10-CM | POA: Diagnosis not present

## 2014-12-10 DIAGNOSIS — E662 Morbid (severe) obesity with alveolar hypoventilation: Secondary | ICD-10-CM | POA: Diagnosis not present

## 2014-12-10 DIAGNOSIS — R079 Chest pain, unspecified: Secondary | ICD-10-CM | POA: Diagnosis not present

## 2014-12-10 DIAGNOSIS — J441 Chronic obstructive pulmonary disease with (acute) exacerbation: Secondary | ICD-10-CM | POA: Diagnosis not present

## 2014-12-10 DIAGNOSIS — J9620 Acute and chronic respiratory failure, unspecified whether with hypoxia or hypercapnia: Secondary | ICD-10-CM | POA: Diagnosis not present

## 2014-12-10 DIAGNOSIS — E876 Hypokalemia: Secondary | ICD-10-CM | POA: Diagnosis not present

## 2014-12-10 DIAGNOSIS — J189 Pneumonia, unspecified organism: Secondary | ICD-10-CM | POA: Diagnosis not present

## 2014-12-10 LAB — BASIC METABOLIC PANEL
ANION GAP: 11 (ref 5–15)
BUN: 12 mg/dL (ref 6–23)
CALCIUM: 8.7 mg/dL (ref 8.4–10.5)
CHLORIDE: 97 mmol/L (ref 96–112)
CO2: 27 mmol/L (ref 19–32)
CREATININE: 0.81 mg/dL (ref 0.50–1.10)
GFR calc non Af Amer: 85 mL/min — ABNORMAL LOW (ref >90)
GLUCOSE: 294 mg/dL — AB (ref 70–99)
Potassium: 5.2 mmol/L — ABNORMAL HIGH (ref 3.5–5.1)
Sodium: 135 mmol/L (ref 135–145)

## 2014-12-10 LAB — GLUCOSE, CAPILLARY
GLUCOSE-CAPILLARY: 269 mg/dL — AB (ref 70–99)
GLUCOSE-CAPILLARY: 291 mg/dL — AB (ref 70–99)
Glucose-Capillary: 228 mg/dL — ABNORMAL HIGH (ref 70–99)
Glucose-Capillary: 328 mg/dL — ABNORMAL HIGH (ref 70–99)

## 2014-12-10 LAB — LACTIC ACID, PLASMA: Lactic Acid, Venous: 1.6 mmol/L (ref 0.5–2.0)

## 2014-12-10 MED ORDER — CETYLPYRIDINIUM CHLORIDE 0.05 % MT LIQD
7.0000 mL | Freq: Two times a day (BID) | OROMUCOSAL | Status: DC
  Administered 2014-12-10 – 2014-12-18 (×12): 7 mL via OROMUCOSAL

## 2014-12-10 MED ORDER — ALBUTEROL SULFATE (2.5 MG/3ML) 0.083% IN NEBU
2.5000 mg | INHALATION_SOLUTION | Freq: Four times a day (QID) | RESPIRATORY_TRACT | Status: DC
  Administered 2014-12-10 – 2014-12-13 (×12): 2.5 mg via RESPIRATORY_TRACT
  Filled 2014-12-10 (×12): qty 3

## 2014-12-10 MED ORDER — INSULIN GLARGINE 100 UNIT/ML ~~LOC~~ SOLN
30.0000 [IU] | Freq: Two times a day (BID) | SUBCUTANEOUS | Status: DC
  Administered 2014-12-10 – 2014-12-18 (×16): 30 [IU] via SUBCUTANEOUS
  Filled 2014-12-10 (×17): qty 0.3

## 2014-12-10 MED ORDER — INSULIN ASPART 100 UNIT/ML ~~LOC~~ SOLN
10.0000 [IU] | Freq: Three times a day (TID) | SUBCUTANEOUS | Status: DC
  Administered 2014-12-10 – 2014-12-13 (×11): 10 [IU] via SUBCUTANEOUS

## 2014-12-10 MED ORDER — PREDNISONE 20 MG PO TABS
60.0000 mg | ORAL_TABLET | Freq: Every day | ORAL | Status: DC
  Administered 2014-12-10 – 2014-12-14 (×5): 60 mg via ORAL
  Filled 2014-12-10 (×5): qty 1
  Filled 2014-12-10: qty 3
  Filled 2014-12-10: qty 1

## 2014-12-10 MED ORDER — OXYCODONE HCL 5 MG PO TABS
10.0000 mg | ORAL_TABLET | Freq: Four times a day (QID) | ORAL | Status: DC | PRN
  Administered 2014-12-10 – 2014-12-18 (×24): 10 mg via ORAL
  Filled 2014-12-10 (×26): qty 2

## 2014-12-10 MED ORDER — SODIUM CHLORIDE 0.9 % IV SOLN
INTRAVENOUS | Status: DC
  Administered 2014-12-10: 06:00:00 via INTRAVENOUS

## 2014-12-10 MED ORDER — IPRATROPIUM-ALBUTEROL 0.5-2.5 (3) MG/3ML IN SOLN: 3.0000 mL | RESPIRATORY_TRACT | Status: DC

## 2014-12-10 MED ORDER — CHLORHEXIDINE GLUCONATE 0.12 % MT SOLN
15.0000 mL | Freq: Two times a day (BID) | OROMUCOSAL | Status: DC
  Administered 2014-12-10 – 2014-12-18 (×14): 15 mL via OROMUCOSAL
  Filled 2014-12-10 (×21): qty 15

## 2014-12-10 MED ORDER — NAPROXEN 500 MG PO TABS
500.0000 mg | ORAL_TABLET | Freq: Two times a day (BID) | ORAL | Status: DC | PRN
  Administered 2014-12-10 – 2014-12-15 (×6): 500 mg via ORAL
  Filled 2014-12-10 (×4): qty 2
  Filled 2014-12-10: qty 1
  Filled 2014-12-10 (×5): qty 2

## 2014-12-10 MED ORDER — IPRATROPIUM-ALBUTEROL 0.5-2.5 (3) MG/3ML IN SOLN: 3.0000 mL | Freq: Four times a day (QID) | RESPIRATORY_TRACT | Status: DC

## 2014-12-10 NOTE — Progress Notes (Signed)
Late Entry from 12/09/2014. Lactic Acid: Mildly elevated at 2.51. My impression is that this is secondary to her mildly dehydrated state and Hypoxia. Will re check in the morning 11/11/2014.

## 2014-12-10 NOTE — Progress Notes (Signed)
Pt's potassium level this AM 5.2. Pt currently has potassium infusing through IV fluids. Chaney Malling on call notified. Orders received. Noreene Larsson RN, BSN

## 2014-12-10 NOTE — Progress Notes (Signed)
RT set up patient for CPAP. Sterile water added to water chamber for humidification. Patient home setting is 12 cmH2O. Patient stated she would place herself on CPAP when ready. RT to monitor and assess as needed.

## 2014-12-10 NOTE — Progress Notes (Signed)
UR completed 

## 2014-12-10 NOTE — Progress Notes (Signed)
Patient was heard talking to sister on the phone and arguing with sister, later on patient called RN that she is very short of breath, Found patient very anxious and hypoxic with o2 sat of  86%. Oxygen increased to 4l/[place] and o2 sat went up to 93%, emotional support given to patient,No visitors sign placed on door and patient made tripple X as per her request.

## 2014-12-10 NOTE — Progress Notes (Signed)
[facility] PROGRESS NOTE  [name] PNT:[id number] DOB: October 26, 1965 DOA: 12/09/2014 PCP: Gwendolyn Grant, MD  Assessment/Plan: 1. Possible community acquired pneumonia. -Patient presenting with complaints of cough associated with shortness of breath. Initial chest x-ray showed bilateral airspace opacities -Blood cultures obtained on 12/09/2014 showed no growth 2 sets -Plan to repeat chest x-ray in a.m. -Continue Levaquin  2.  Acute on chronic respiratory failure -Evidence by patient presenting in respiratory distress having a respiratory rate of 26 in the emergency department. -Suspect secondary to commit acquire pneumonia with possible COPD exacerbation improving. -Continue empiric antibiotic therapy, duo nebs, steroids.  3.  Possible superimposed COPD exacerbation -Patient reports ongoing shortness of breath, having diminished breath sounds on lung exam -Will treat with prednisone 60 mg by mouth daily, DuoNeb scheduled every 4 hours and antibiotic therapy  4.  Poorly controlled insulin dependent diabetes mellitus -Patient reporting taking Lantus 30 units subcutaneous changes twice a day at home. -During this hospitalization her blood sugars have fluctuated between 238 and 382 -Will increase Lantus to 30 units subcutaneous changes twice a day, continue monitoring blood sugars, mealtime coverage with 10 units of NovoLog  5.  Chronic diastolic congestive heart failure. -Last transthoracic echocardiogram performed on 11/21/2013 that showed an EF of 08-67%, grade 1 diastolic dysfunction -Initial lab work revealed a BNP of 31 and does not appear volume overloaded on physical exam -Her IV fluids were stopped today. She was hydrated over night as hypovolemia was suspected given presenting hypotension while on diuretic therapy. Will consider restarting diuretic therapy and a.m. after assessing by status.    Code Status: Full code Family Communication:  Disposition Plan:  Dysphagia discharge home when medically stable   Antibiotics:  Levaquin  HPI/Subjective: Patient is a pleasant 49 year old female with a past medical history morbid obesity, obesity hypoventilation syndrome, chronic objective pulmonary disease, chronic diastolic congestive heart failure, admitted to the medicine service on 12/09/2014 when she presented with complaints of increasing shortness of breath. She was initially worked up with a chest x-ray which showed vascular congestion, bilateral central airspace opacities concerning for mild pulmonary edema, although pneumonia could also present with similar changes. She was started on empiric antibiotic therapy with Levaquin.  Objective: Filed Vitals:   12/10/14 1401  BP: 124/78  Pulse: 79  Temp: 98.8 F (37.1 C)  Resp: 16    Intake/Output Summary (Last 24 hours) at 12/10/14 1545 Last data filed at 12/10/14 0600  Gross per 24 hour  Intake 1103.75 ml  Output   1950 ml  Net -846.25 ml   Filed Weights   12/09/14 1541  Weight: 134.265 kg (296 lb)    Exam:   General:  Patient appears dyspneic at rest, otherwise awake, alert, mentating well  Cardiovascular: Regular rate and rhythm normal S1-S2 no murmurs rubs or gallops  Respiratory: Diminished breath sounds bilaterally had few expiratory wheezes  Abdomen: Obese, soft nontender nondistended positive bowel sounds  Musculoskeletal: No edema  Data Reviewed: Basic Metabolic Panel:  Recent Labs Lab 12/06/14 1311 12/09/14 0330 12/10/14 0423  NA 136 131* 135  K 2.9* 3.1* 5.2*  CL 91* 89* 97  CO2 30 29 27   GLUCOSE 267* 301* 294*  BUN 8 8 12   CREATININE 0.83 1.06 0.81  CALCIUM 7.9* 8.0* 8.7   Liver Function Tests: No results for input(s): AST, ALT, ALKPHOS, BILITOT, PROT, ALBUMIN in the last 168 hours. No results for input(s): LIPASE, AMYLASE in the last 168 hours. No results for input(s): AMMONIA in the last  168 hours. CBC:  Recent Labs Lab 12/06/14 1311  12/09/14 0330  WBC 5.4 6.7  HGB 13.2 14.2  HCT 41.5 44.2  MCV 79.2 81.1  PLT 152 168   Cardiac Enzymes: No results for input(s): CKTOTAL, CKMB, CKMBINDEX, TROPONINI in the last 168 hours. BNP (last 3 results)  Recent Labs  10/18/14 1355 12/09/14 0330  BNP 11.1 31.0    ProBNP (last 3 results)  Recent Labs  02/11/14 0021 04/17/14 0120 07/20/14 1817  PROBNP 32.8 44.7 39.7    CBG:  Recent Labs Lab 12/09/14 1342 12/09/14 1731 12/09/14 2141 12/10/14 0759 12/10/14 1205  GLUCAP 238* 382* 328* 269* 291*    Recent Results (from the past 240 hour(s))  Culture, blood (routine x 2)     Status: None (Preliminary result)   Collection Time: 12/09/14  9:45 AM  Result Value Ref Range Status   Specimen Description BLOOD LEFT ARM  Final   Special Requests BOTTLES DRAWN AEROBIC AND ANAEROBIC 8CC  Final   Culture   Final           BLOOD CULTURE RECEIVED NO GROWTH TO DATE CULTURE WILL BE HELD FOR 5 DAYS BEFORE ISSUING A FINAL NEGATIVE REPORT Performed at [facility]    Report Status PENDING  Incomplete  Culture, blood (routine x 2)     Status: None (Preliminary result)   Collection Time: 12/09/14  9:50 AM  Result Value Ref Range Status   Specimen Description BLOOD LEFT ARM  Final   Special Requests BOTTLES DRAWN AEROBIC ONLY 4CC  Final   Culture   Final           BLOOD CULTURE RECEIVED NO GROWTH TO DATE CULTURE WILL BE HELD FOR 5 DAYS BEFORE ISSUING A FINAL NEGATIVE REPORT Performed at [facility]    Report Status PENDING  Incomplete     Studies: Dg Chest 2 View (if Patient Has Fever And/or Copd)  12/09/2014   CLINICAL DATA:  Acute onset of severe shortness of breath. Initial encounter.  EXAM: CHEST  2 VIEW  COMPARISON:  Chest radiograph performed 10/31/2014  FINDINGS: The lungs are well-aerated. Vascular congestion is noted. Bilateral central airspace opacities raise concern for pulmonary edema, superimposed on chronic lung changes as previously noted.  Mild pneumonia might have a similar appearance. There is no evidence of pleural effusion or pneumothorax.  The heart is borderline normal in size. No acute osseous abnormalities are seen.  IMPRESSION: Vascular congestion noted. Bilateral central airspace opacities raise concern for mild pulmonary edema, superimposed on chronic lung changes. Mild pneumonia might have a similar appearance.   Electronically Signed   By: Garald Balding M.D.   On: 12/09/2014 04:22    Scheduled Meds: . albuterol  2.5 mg Nebulization Q6H  . ALPRAZolam  1 mg Oral TID  . antiseptic oral rinse  7 mL Mouth Rinse q12n4p  . aspirin EC  81 mg Oral Daily  . budesonide-formoterol  2 puff Inhalation BID  . chlorhexidine  15 mL Mouth Rinse BID  . enoxaparin (LOVENOX) injection  65 mg Subcutaneous Q24H  . fluticasone  2 spray Each Nare Daily  . gabapentin  300 mg Oral QHS  . glimepiride  1 mg Oral Q breakfast  . insulin aspart  0-20 Units Subcutaneous TID WC  . insulin aspart  0-5 Units Subcutaneous QHS  . insulin aspart  10 Units Subcutaneous TID WC  . insulin glargine  30 Units Subcutaneous BID  . levofloxacin  750 mg Oral  Daily  . pantoprazole  40 mg Oral Daily  . predniSONE  60 mg Oral Q breakfast  . tiotropium  18 mcg Inhalation Daily  . varenicline  0.5 mg Oral BID WC  . venlafaxine XR  37.5 mg Oral Q breakfast   Continuous Infusions:   Active Problems:   Hypokalemia   CAP (community acquired pneumonia)    Time spent: 35 minutes    Kelvin Cellar  [facility] Pager [phone]. If 7PM-7AM, please contact night-coverage at [email or web address], password [facility] 12/10/2014, 3:45 PM  LOS: 1 day

## 2014-12-11 ENCOUNTER — Observation Stay (HOSPITAL_COMMUNITY): Payer: Medicare Other

## 2014-12-11 DIAGNOSIS — I517 Cardiomegaly: Secondary | ICD-10-CM | POA: Diagnosis not present

## 2014-12-11 DIAGNOSIS — R0989 Other specified symptoms and signs involving the circulatory and respiratory systems: Secondary | ICD-10-CM | POA: Diagnosis not present

## 2014-12-11 DIAGNOSIS — J441 Chronic obstructive pulmonary disease with (acute) exacerbation: Secondary | ICD-10-CM

## 2014-12-11 DIAGNOSIS — J9620 Acute and chronic respiratory failure, unspecified whether with hypoxia or hypercapnia: Secondary | ICD-10-CM | POA: Diagnosis not present

## 2014-12-11 LAB — BASIC METABOLIC PANEL
Anion gap: 9 (ref 5–15)
BUN: 15 mg/dL (ref 6–23)
CALCIUM: 9.5 mg/dL (ref 8.4–10.5)
CO2: 26 mmol/L (ref 19–32)
Chloride: 98 mmol/L (ref 96–112)
Creatinine, Ser: 0.92 mg/dL (ref 0.50–1.10)
GFR calc Af Amer: 85 mL/min — ABNORMAL LOW (ref >90)
GFR calc non Af Amer: 73 mL/min — ABNORMAL LOW (ref >90)
GLUCOSE: 306 mg/dL — AB (ref 70–99)
Potassium: 5.3 mmol/L — ABNORMAL HIGH (ref 3.5–5.1)
Sodium: 133 mmol/L — ABNORMAL LOW (ref 135–145)

## 2014-12-11 LAB — CBC
HCT: 38.3 % (ref 36.0–46.0)
Hemoglobin: 12.1 g/dL (ref 12.0–15.0)
MCH: 25.9 pg — ABNORMAL LOW (ref 26.0–34.0)
MCHC: 31.6 g/dL (ref 30.0–36.0)
MCV: 82 fL (ref 78.0–100.0)
PLATELETS: 171 10*3/uL (ref 150–400)
RBC: 4.67 MIL/uL (ref 3.87–5.11)
RDW: 18.8 % — AB (ref 11.5–15.5)
WBC: 6.7 10*3/uL (ref 4.0–10.5)

## 2014-12-11 LAB — GLUCOSE, CAPILLARY
Glucose-Capillary: 272 mg/dL — ABNORMAL HIGH (ref 70–99)
Glucose-Capillary: 324 mg/dL — ABNORMAL HIGH (ref 70–99)
Glucose-Capillary: 384 mg/dL — ABNORMAL HIGH (ref 70–99)
Glucose-Capillary: 431 mg/dL — ABNORMAL HIGH (ref 70–99)

## 2014-12-11 MED ORDER — TORSEMIDE 100 MG PO TABS
100.0000 mg | ORAL_TABLET | Freq: Two times a day (BID) | ORAL | Status: DC
  Administered 2014-12-11: 100 mg via ORAL
  Filled 2014-12-11 (×3): qty 1

## 2014-12-11 MED ORDER — INSULIN ASPART 100 UNIT/ML ~~LOC~~ SOLN
6.0000 [IU] | Freq: Once | SUBCUTANEOUS | Status: AC
  Administered 2014-12-11: 6 [IU] via SUBCUTANEOUS

## 2014-12-11 MED ORDER — TORSEMIDE 100 MG PO TABS
100.0000 mg | ORAL_TABLET | Freq: Two times a day (BID) | ORAL | Status: DC
  Administered 2014-12-12 – 2014-12-15 (×7): 100 mg via ORAL
  Filled 2014-12-11 (×10): qty 1

## 2014-12-11 MED ORDER — FUROSEMIDE 10 MG/ML IJ SOLN
40.0000 mg | Freq: Once | INTRAMUSCULAR | Status: AC
  Administered 2014-12-11: 40 mg via INTRAVENOUS
  Filled 2014-12-11: qty 4

## 2014-12-11 NOTE — Progress Notes (Signed)
UR completed 

## 2014-12-11 NOTE — Progress Notes (Signed)
[facility] PROGRESS NOTE  JAHNIA HEWES HUD:[id number] DOB: Nov 25, 1965 DOA: 12/09/2014 PCP: Gwendolyn Grant, MD  Assessment/Plan: 1. Possible community acquired pneumonia. -Patient presenting with complaints of cough associated with shortness of breath. Initial chest x-ray showed bilateral airspace opacities -Blood cultures obtained on 12/09/2014 showed no growth 2 sets -Repeat a chest x-ray showing evidence of fluid overload, will provide 40 mg of IV Lasix. -Continue Levaquin  2.  Acute on chronic respiratory failure -Evidence by patient presenting in respiratory distress having a respiratory rate of 26 in the emergency department. -Suspect secondary to commit acquire pneumonia with possible COPD exacerbation improving. -Continue empiric antibiotic therapy, duo nebs, steroids. -Since chest x-ray showed evidence of fluid overload, giving 40 mg of IV Lasix  3.  Possible superimposed COPD exacerbation -Patient reports ongoing shortness of breath, having diminished breath sounds on lung exam -Will treat with prednisone 60 mg by mouth daily, DuoNeb scheduled every 4 hours and antibiotic therapy  4.  Poorly controlled insulin dependent diabetes mellitus -Patient reporting taking Lantus 30 units subcutaneous changes twice a day at home. -During this hospitalization her blood sugars have fluctuated between 238 and 382 -Will increase Lantus to 30 units subcutaneous changes twice a day, continue monitoring blood sugars, mealtime coverage with 10 units of NovoLog  5.  Chronic diastolic congestive heart failure. -Last transthoracic echocardiogram performed on 11/21/2013 that showed an EF of 85-88%, grade 1 diastolic dysfunction -Initial lab work revealed a BNP of 31 and does not appear volume overloaded on physical exam -Patient reporting worsening short of breath as chest x-ray revealed evidence of fluid overload, she was administered 40 mg of IV Lasix. Plan to restart torsemide at  100 mg by mouth twice a day    Code Status: Full code Family Communication:  Disposition Plan: Anticipate discharge home when medically stable   Antibiotics:  Levaquin  HPI/Subjective: Patient is a pleasant 49 year old female with a past medical history morbid obesity, obesity hypoventilation syndrome, chronic objective pulmonary disease, chronic diastolic congestive heart failure, admitted to the medicine service on 12/09/2014 when she presented with complaints of increasing shortness of breath. She was initially worked up with a chest x-ray which showed vascular congestion, bilateral central airspace opacities concerning for mild pulmonary edema, although pneumonia could also present with similar changes. She was started on empiric antibiotic therapy with Levaquin.  Objective: Filed Vitals:   12/11/14 0453  BP: 127/70  Pulse: 80  Temp: 98.4 F (36.9 C)  Resp: 16    Intake/Output Summary (Last 24 hours) at 12/11/14 1317 Last data filed at 12/11/14 1204  Gross per 24 hour  Intake 2037.5 ml  Output   5200 ml  Net -3162.5 ml   Filed Weights   12/09/14 1541  Weight: 134.265 kg (296 lb)    Exam:   General:  Patient with ongoing dyspnea though possibly improved from yesterday, she is awake, alert, no acute distress  Cardiovascular: Regular rate and rhythm normal S1-S2 no murmurs rubs or gallops  Respiratory: Diminished breath sounds bilaterally had few expiratory wheezes  Abdomen: Obese, soft nontender nondistended positive bowel sounds  Musculoskeletal: No edema  Data Reviewed: Basic Metabolic Panel:  Recent Labs Lab 12/06/14 1311 12/09/14 0330 12/10/14 0423 12/11/14 0430  NA 136 131* 135 133*  K 2.9* 3.1* 5.2* 5.3*  CL 91* 89* 97 98  CO2 30 29 27 26   GLUCOSE 267* 301* 294* 306*  BUN 8 8 12 15   CREATININE 0.83 1.06 0.81 0.92  CALCIUM 7.9* 8.0* 8.7  9.5   Liver Function Tests: No results for input(s): AST, ALT, ALKPHOS, BILITOT, PROT, ALBUMIN in the  last 168 hours. No results for input(s): LIPASE, AMYLASE in the last 168 hours. No results for input(s): AMMONIA in the last 168 hours. CBC:  Recent Labs Lab 12/06/14 1311 12/09/14 0330 12/11/14 0430  WBC 5.4 6.7 6.7  HGB 13.2 14.2 12.1  HCT 41.5 44.2 38.3  MCV 79.2 81.1 82.0  PLT 152 168 171   Cardiac Enzymes: No results for input(s): CKTOTAL, CKMB, CKMBINDEX, TROPONINI in the last 168 hours. BNP (last 3 results)  Recent Labs  10/18/14 1355 12/09/14 0330  BNP 11.1 31.0    ProBNP (last 3 results)  Recent Labs  02/11/14 0021 04/17/14 0120 07/20/14 1817  PROBNP 32.8 44.7 39.7    CBG:  Recent Labs Lab 12/10/14 1205 12/10/14 1717 12/10/14 2047 12/11/14 0913 12/11/14 1201  GLUCAP 291* 228* 328* 272* 324*    Recent Results (from the past 240 hour(s))  Culture, blood (routine x 2)     Status: None (Preliminary result)   Collection Time: 12/09/14  9:45 AM  Result Value Ref Range Status   Specimen Description BLOOD LEFT ARM  Final   Special Requests BOTTLES DRAWN AEROBIC AND ANAEROBIC 8CC  Final   Culture   Final           BLOOD CULTURE RECEIVED NO GROWTH TO DATE CULTURE WILL BE HELD FOR 5 DAYS BEFORE ISSUING A FINAL NEGATIVE REPORT Performed at [facility]    Report Status PENDING  Incomplete  Culture, blood (routine x 2)     Status: None (Preliminary result)   Collection Time: 12/09/14  9:50 AM  Result Value Ref Range Status   Specimen Description BLOOD LEFT ARM  Final   Special Requests BOTTLES DRAWN AEROBIC ONLY 4CC  Final   Culture   Final           BLOOD CULTURE RECEIVED NO GROWTH TO DATE CULTURE WILL BE HELD FOR 5 DAYS BEFORE ISSUING A FINAL NEGATIVE REPORT Performed at [facility]    Report Status PENDING  Incomplete     Studies: Dg Chest 2 View  12/11/2014   CLINICAL DATA:  Short of breath.  History pneumonia.  EXAM: CHEST  2 VIEW  COMPARISON:  12/09/2014  FINDINGS: Chronic lung disease with hyperinflation and bibasilar  scarring. Prominent lung markings in the bases are stable with prior studies  Cardiac enlargement with pulmonary vascular congestion. Findings are consistent with mild fluid overload without edema or effusion. No definite pneumonia.  IMPRESSION: Chronic lung disease bibasilar scarring which appears stable  Cardiac enlargement with mild vascular congestion suggesting mild fluid overload.  No change from the prior study.   Electronically Signed   By: Franchot Gallo M.D.   On: 12/11/2014 10:36    Scheduled Meds: . albuterol  2.5 mg Nebulization Q6H  . ALPRAZolam  1 mg Oral TID  . antiseptic oral rinse  7 mL Mouth Rinse q12n4p  . aspirin EC  81 mg Oral Daily  . budesonide-formoterol  2 puff Inhalation BID  . chlorhexidine  15 mL Mouth Rinse BID  . enoxaparin (LOVENOX) injection  65 mg Subcutaneous Q24H  . fluticasone  2 spray Each Nare Daily  . gabapentin  300 mg Oral QHS  . glimepiride  1 mg Oral Q breakfast  . insulin aspart  0-20 Units Subcutaneous TID WC  . insulin aspart  0-5 Units Subcutaneous QHS  . insulin aspart  10 Units Subcutaneous TID WC  . insulin glargine  30 Units Subcutaneous BID  . levofloxacin  750 mg Oral Daily  . pantoprazole  40 mg Oral Daily  . predniSONE  60 mg Oral Q breakfast  . tiotropium  18 mcg Inhalation Daily  . torsemide  100 mg Oral BID  . varenicline  0.5 mg Oral BID WC  . venlafaxine XR  37.5 mg Oral Q breakfast   Continuous Infusions:   Active Problems:   Hypokalemia   CAP (community acquired pneumonia)    Time spent: 35 minutes    Kelvin Cellar  [facility] Pager [phone]. If 7PM-7AM, please contact night-coverage at [email or web address], password [facility] 12/11/2014, 1:17 PM  LOS: 2 days

## 2014-12-12 DIAGNOSIS — R0602 Shortness of breath: Secondary | ICD-10-CM

## 2014-12-12 DIAGNOSIS — I5032 Chronic diastolic (congestive) heart failure: Secondary | ICD-10-CM

## 2014-12-12 LAB — BASIC METABOLIC PANEL
Anion gap: 13 (ref 5–15)
BUN: 22 mg/dL (ref 6–23)
CO2: 30 mmol/L (ref 19–32)
Calcium: 10.1 mg/dL (ref 8.4–10.5)
Chloride: 95 mmol/L — ABNORMAL LOW (ref 96–112)
Creatinine, Ser: 1.05 mg/dL (ref 0.50–1.10)
GFR, EST AFRICAN AMERICAN: 72 mL/min — AB (ref >90)
GFR, EST NON AFRICAN AMERICAN: 62 mL/min — AB (ref >90)
GLUCOSE: 294 mg/dL — AB (ref 70–99)
Potassium: 4 mmol/L (ref 3.5–5.1)
Sodium: 138 mmol/L (ref 135–145)

## 2014-12-12 LAB — GLUCOSE, CAPILLARY
GLUCOSE-CAPILLARY: 221 mg/dL — AB (ref 70–99)
GLUCOSE-CAPILLARY: 293 mg/dL — AB (ref 70–99)
GLUCOSE-CAPILLARY: 289 mg/dL — AB (ref 70–99)
Glucose-Capillary: 430 mg/dL — ABNORMAL HIGH (ref 70–99)

## 2014-12-12 LAB — CBC
HEMATOCRIT: 42.7 % (ref 36.0–46.0)
Hemoglobin: 13.5 g/dL (ref 12.0–15.0)
MCH: 25.9 pg — ABNORMAL LOW (ref 26.0–34.0)
MCHC: 31.6 g/dL (ref 30.0–36.0)
MCV: 81.8 fL (ref 78.0–100.0)
Platelets: 171 10*3/uL (ref 150–400)
RBC: 5.22 MIL/uL — ABNORMAL HIGH (ref 3.87–5.11)
RDW: 19.1 % — ABNORMAL HIGH (ref 11.5–15.5)
WBC: 9.2 10*3/uL (ref 4.0–10.5)

## 2014-12-12 LAB — GLUCOSE, RANDOM: GLUCOSE: 414 mg/dL — AB (ref 70–99)

## 2014-12-12 MED ORDER — GUAIFENESIN ER 600 MG PO TB12
1200.0000 mg | ORAL_TABLET | Freq: Two times a day (BID) | ORAL | Status: DC
Start: 2014-12-12 — End: 2014-12-18
  Administered 2014-12-12 – 2014-12-18 (×12): 1200 mg via ORAL
  Filled 2014-12-12 (×15): qty 2

## 2014-12-12 MED ORDER — MORPHINE SULFATE 2 MG/ML IJ SOLN
1.0000 mg | Freq: Once | INTRAMUSCULAR | Status: AC
  Administered 2014-12-12: 1 mg via INTRAVENOUS
  Filled 2014-12-12: qty 1

## 2014-12-12 NOTE — Progress Notes (Signed)
[facility] PROGRESS NOTE  [name] GNO:[id number] DOB: May 31, 1966 DOA: 12/09/2014 PCP: Gwendolyn Grant, MD  Assessment/Plan: 1. Possible community acquired pneumonia. -Patient presenting with complaints of cough associated with shortness of breath. Initial chest x-ray showed bilateral airspace opacities -Blood cultures obtained on 12/09/2014 showed no growth 2 sets -Continue Levaquin -Clinically improved  2.  Acute on chronic respiratory failure -Evidence by patient presenting in respiratory distress having a respiratory rate of 26 in the emergency department. -Suspect secondary to commit acquire pneumonia with possible COPD exacerbation improving. -Continue empiric antibiotic therapy, duo nebs, steroids. -Since chest x-ray showed evidence of fluid overload she was given a dose of IV lasix on 12/11/2014  3.  Possible superimposed COPD exacerbation -Patient reports ongoing shortness of breath, having diminished breath sounds on lung exam -Continue prednisone 60 mg by mouth daily, DuoNeb scheduled every 4 hours and antibiotic therapy  4.  Poorly controlled insulin dependent diabetes mellitus -Patient reporting taking Lantus 30 units subcutaneous changes twice a day at home. -During this hospitalization her blood sugars have been mostly in the 200 range -Will increased Lantus to 30 units subcutaneous changes twice a day, continue monitoring blood sugars, mealtime coverage with 10 units of NovoLog  5.  Chronic diastolic congestive heart failure. -Last transthoracic echocardiogram performed on 11/21/2013 that showed an EF of 16-94%, grade 1 diastolic dysfunction -She was given dose of Lasix 40mg  IV x 1 on 12/12/2014 -Continue Torsemide 100 mg PO BID    Code Status: Full code Family Communication:  Disposition Plan: Anticipate discharge home when medically stable   Antibiotics:  Levaquin  HPI/Subjective: Patient is a pleasant 49 year old female with a past  medical history morbid obesity, obesity hypoventilation syndrome, chronic objective pulmonary disease, chronic diastolic congestive heart failure, admitted to the medicine service on 12/09/2014 when she presented with complaints of increasing shortness of breath. She was initially worked up with a chest x-ray which showed vascular congestion, bilateral central airspace opacities concerning for mild pulmonary edema, although pneumonia could also present with similar changes. She was started on empiric antibiotic therapy with Levaquin.  Objective: Filed Vitals:   12/12/14 1340  BP: 105/46  Pulse: 89  Temp: 98.3 F (36.8 C)  Resp: 20    Intake/Output Summary (Last 24 hours) at 12/12/14 1539 Last data filed at 12/12/14 1230  Gross per 24 hour  Intake   1980 ml  Output   8400 ml  Net  -6420 ml   Filed Weights   12/09/14 1541  Weight: 134.265 kg (296 lb)    Exam:   General:  Patient reports feeling better today  Cardiovascular: Regular rate and rhythm normal S1-S2 no murmurs rubs or gallops  Respiratory: Diminished breath sounds bilaterally had few expiratory wheezes  Abdomen: Obese, soft nontender nondistended positive bowel sounds  Musculoskeletal: No edema  Data Reviewed: Basic Metabolic Panel:  Recent Labs Lab 12/06/14 1311 12/09/14 0330 12/10/14 0423 12/11/14 0430 12/12/14 0448  NA 136 131* 135 133* 138  K 2.9* 3.1* 5.2* 5.3* 4.0  CL 91* 89* 97 98 95*  CO2 30 29 27 26 30   GLUCOSE 267* 301* 294* 306* 294*  BUN 8 8 12 15 22   CREATININE 0.83 1.06 0.81 0.92 1.05  CALCIUM 7.9* 8.0* 8.7 9.5 10.1   Liver Function Tests: No results for input(s): AST, ALT, ALKPHOS, BILITOT, PROT, ALBUMIN in the last 168 hours. No results for input(s): LIPASE, AMYLASE in the last 168 hours. No results for input(s): AMMONIA in the last 168 hours.  CBC:  Recent Labs Lab 12/06/14 1311 12/09/14 0330 12/11/14 0430 12/12/14 0448  WBC 5.4 6.7 6.7 9.2  HGB 13.2 14.2 12.1 13.5  HCT  41.5 44.2 38.3 42.7  MCV 79.2 81.1 82.0 81.8  PLT 152 168 171 171   Cardiac Enzymes: No results for input(s): CKTOTAL, CKMB, CKMBINDEX, TROPONINI in the last 168 hours. BNP (last 3 results)  Recent Labs  10/18/14 1355 12/09/14 0330  BNP 11.1 31.0    ProBNP (last 3 results)  Recent Labs  02/11/14 0021 04/17/14 0120 07/20/14 1817  PROBNP 32.8 44.7 39.7    CBG:  Recent Labs Lab 12/11/14 1201 12/11/14 1659 12/11/14 2113 12/12/14 0753 12/12/14 1227  GLUCAP 324* 384* 431* 221* 293*    Recent Results (from the past 240 hour(s))  Culture, blood (routine x 2)     Status: None (Preliminary result)   Collection Time: 12/09/14  9:45 AM  Result Value Ref Range Status   Specimen Description BLOOD LEFT ARM  Final   Special Requests BOTTLES DRAWN AEROBIC AND ANAEROBIC 8CC  Final   Culture   Final           BLOOD CULTURE RECEIVED NO GROWTH TO DATE CULTURE WILL BE HELD FOR 5 DAYS BEFORE ISSUING A FINAL NEGATIVE REPORT Performed at [facility]    Report Status PENDING  Incomplete  Culture, blood (routine x 2)     Status: None (Preliminary result)   Collection Time: 12/09/14  9:50 AM  Result Value Ref Range Status   Specimen Description BLOOD LEFT ARM  Final   Special Requests BOTTLES DRAWN AEROBIC ONLY 4CC  Final   Culture   Final           BLOOD CULTURE RECEIVED NO GROWTH TO DATE CULTURE WILL BE HELD FOR 5 DAYS BEFORE ISSUING A FINAL NEGATIVE REPORT Performed at [facility]    Report Status PENDING  Incomplete     Studies: Dg Chest 2 View  12/11/2014   CLINICAL DATA:  Short of breath.  History pneumonia.  EXAM: CHEST  2 VIEW  COMPARISON:  12/09/2014  FINDINGS: Chronic lung disease with hyperinflation and bibasilar scarring. Prominent lung markings in the bases are stable with prior studies  Cardiac enlargement with pulmonary vascular congestion. Findings are consistent with mild fluid overload without edema or effusion. No definite pneumonia.  IMPRESSION:  Chronic lung disease bibasilar scarring which appears stable  Cardiac enlargement with mild vascular congestion suggesting mild fluid overload.  No change from the prior study.   Electronically Signed   By: Franchot Gallo M.D.   On: 12/11/2014 10:36    Scheduled Meds: . albuterol  2.5 mg Nebulization Q6H  . ALPRAZolam  1 mg Oral TID  . antiseptic oral rinse  7 mL Mouth Rinse q12n4p  . aspirin EC  81 mg Oral Daily  . budesonide-formoterol  2 puff Inhalation BID  . chlorhexidine  15 mL Mouth Rinse BID  . enoxaparin (LOVENOX) injection  65 mg Subcutaneous Q24H  . fluticasone  2 spray Each Nare Daily  . gabapentin  300 mg Oral QHS  . glimepiride  1 mg Oral Q breakfast  . insulin aspart  0-20 Units Subcutaneous TID WC  . insulin aspart  0-5 Units Subcutaneous QHS  . insulin aspart  10 Units Subcutaneous TID WC  . insulin glargine  30 Units Subcutaneous BID  . levofloxacin  750 mg Oral Daily  . pantoprazole  40 mg Oral Daily  . predniSONE  60  mg Oral Q breakfast  . tiotropium  18 mcg Inhalation Daily  . torsemide  100 mg Oral BID  . varenicline  0.5 mg Oral BID WC  . venlafaxine XR  37.5 mg Oral Q breakfast   Continuous Infusions:   Active Problems:   Hypokalemia   CAP (community acquired pneumonia)    Time spent: 30 minutes    Kelvin Cellar  [facility] Pager [phone]. If 7PM-7AM, please contact night-coverage at [email or web address], password [facility] 12/12/2014, 3:39 PM  LOS: 3 days

## 2014-12-12 NOTE — Progress Notes (Signed)
Pt stated that she would self administer CPAP when ready for bed.  Current settings are 12 CMH20 per Pt home setting.  Pt to notify RT if any assistance is needed throughout the night.  RT to monitor and assess as needed.

## 2014-12-12 NOTE — Progress Notes (Signed)
Pt placed herself on CPAP set at [facility] with 4Lpm of oxygen bled in SpO2-97%. Pt states she is comfortable and is tolerating CPAP well at this time. RT will continue to monitor a needed.

## 2014-12-12 NOTE — Progress Notes (Signed)
UR completed 

## 2014-12-12 NOTE — Progress Notes (Signed)
Patient with CBG 430, Dr Coralyn Pear notified and stat lab ordered.Will continue to monitor.

## 2014-12-13 ENCOUNTER — Other Ambulatory Visit: Payer: Self-pay

## 2014-12-13 ENCOUNTER — Observation Stay (HOSPITAL_COMMUNITY): Payer: Medicare Other

## 2014-12-13 DIAGNOSIS — R0602 Shortness of breath: Secondary | ICD-10-CM | POA: Diagnosis not present

## 2014-12-13 DIAGNOSIS — R079 Chest pain, unspecified: Secondary | ICD-10-CM

## 2014-12-13 LAB — GLUCOSE, CAPILLARY
GLUCOSE-CAPILLARY: 234 mg/dL — AB (ref 70–99)
GLUCOSE-CAPILLARY: 335 mg/dL — AB (ref 70–99)
Glucose-Capillary: 146 mg/dL — ABNORMAL HIGH (ref 70–99)
Glucose-Capillary: 267 mg/dL — ABNORMAL HIGH (ref 70–99)

## 2014-12-13 LAB — CBC
HEMATOCRIT: 41.9 % (ref 36.0–46.0)
HEMOGLOBIN: 13.2 g/dL (ref 12.0–15.0)
MCH: 25.9 pg — ABNORMAL LOW (ref 26.0–34.0)
MCHC: 31.5 g/dL (ref 30.0–36.0)
MCV: 82.3 fL (ref 78.0–100.0)
Platelets: 134 10*3/uL — ABNORMAL LOW (ref 150–400)
RBC: 5.09 MIL/uL (ref 3.87–5.11)
RDW: 19.2 % — ABNORMAL HIGH (ref 11.5–15.5)
WBC: 8.5 10*3/uL (ref 4.0–10.5)

## 2014-12-13 LAB — BASIC METABOLIC PANEL
Anion gap: 9 (ref 5–15)
BUN: 23 mg/dL (ref 6–23)
CO2: 36 mmol/L — ABNORMAL HIGH (ref 19–32)
Calcium: 9.3 mg/dL (ref 8.4–10.5)
Chloride: 90 mmol/L — ABNORMAL LOW (ref 96–112)
Creatinine, Ser: 1 mg/dL (ref 0.50–1.10)
GFR calc Af Amer: 77 mL/min — ABNORMAL LOW (ref >90)
GFR calc non Af Amer: 66 mL/min — ABNORMAL LOW (ref >90)
Glucose, Bld: 158 mg/dL — ABNORMAL HIGH (ref 70–99)
POTASSIUM: 3.8 mmol/L (ref 3.5–5.1)
SODIUM: 135 mmol/L (ref 135–145)

## 2014-12-13 LAB — BRAIN NATRIURETIC PEPTIDE: B NATRIURETIC PEPTIDE 5: 29.7 pg/mL (ref 0.0–100.0)

## 2014-12-13 MED ORDER — NITROGLYCERIN 0.4 MG SL SUBL
0.4000 mg | SUBLINGUAL_TABLET | SUBLINGUAL | Status: DC | PRN
  Administered 2014-12-13: 0.4 mg via SUBLINGUAL

## 2014-12-13 MED ORDER — NITROGLYCERIN 0.4 MG SL SUBL
SUBLINGUAL_TABLET | SUBLINGUAL | Status: AC
  Filled 2014-12-13: qty 1

## 2014-12-13 MED ORDER — LORAZEPAM 2 MG/ML IJ SOLN
0.5000 mg | Freq: Once | INTRAMUSCULAR | Status: AC
  Administered 2014-12-13: 0.5 mg via INTRAVENOUS
  Filled 2014-12-13: qty 1

## 2014-12-13 MED ORDER — ALBUTEROL SULFATE (2.5 MG/3ML) 0.083% IN NEBU
2.5000 mg | INHALATION_SOLUTION | Freq: Three times a day (TID) | RESPIRATORY_TRACT | Status: DC
  Administered 2014-12-14: 2.5 mg via RESPIRATORY_TRACT
  Filled 2014-12-13 (×2): qty 3

## 2014-12-13 MED ORDER — ALBUTEROL SULFATE (2.5 MG/3ML) 0.083% IN NEBU
2.5000 mg | INHALATION_SOLUTION | Freq: Four times a day (QID) | RESPIRATORY_TRACT | Status: DC | PRN
  Administered 2014-12-13 – 2014-12-17 (×4): 2.5 mg via RESPIRATORY_TRACT
  Filled 2014-12-13 (×4): qty 3

## 2014-12-13 NOTE — Progress Notes (Signed)
UR completed 

## 2014-12-13 NOTE — Progress Notes (Signed)
[facility] PROGRESS NOTE  [name] QMG:[id number] DOB: 25-Feb-1966 DOA: 12/09/2014 PCP: Gwendolyn Grant, MD  Assessment/Plan: 1. Possible community acquired pneumonia. -Patient presenting with complaints of cough associated with shortness of breath. Initial chest x-ray showed bilateral airspace opacities -Blood cultures obtained on 12/09/2014 showed no growth 2 sets -Continue Levaquin -She reported SOB on my evaluation, will repeat CXR  2.  Acute on chronic respiratory failure -Evidence by patient presenting in respiratory distress having a respiratory rate of 26 in the emergency department. -Suspect secondary to commit acquire pneumonia with possible COPD exacerbation improving. -Continue empiric antibiotic therapy, duo nebs, steroids. -Since chest x-ray showed evidence of fluid overload she was given a dose of IV lasix on 12/11/2014 -She continues to have dyspnea for which will repeat a CXR. She was ambulated on 12/12/2014 desatting to 85% whioe on 4L .   3.  Possible superimposed COPD exacerbation -Patient reports ongoing shortness of breath, having diminished breath sounds on lung exam -Continue prednisone 60 mg by mouth daily, DuoNeb scheduled every 4 hours and antibiotic therapy  4.  Poorly controlled insulin dependent diabetes mellitus -Patient reporting taking Lantus 30 units subcutaneous changes twice a day at home. -During this hospitalization her blood sugars have been mostly in the 200 range -On Lantus to 30 units subcutaneous changes twice a day, continue monitoring blood sugars, mealtime coverage with 10 units of NovoLog -AM blood sugar 158  5.  Chronic diastolic congestive heart failure. -Last transthoracic echocardiogram performed on 11/21/2013 that showed an EF of 32-67%, grade 1 diastolic dysfunction -She was given dose of Lasix 40mg  IV x 1 on 12/12/2014 -Continue Torsemide 100 mg PO BID  6. Chest Pain -She complained of CP today, will follow up  on CXR, EKG and troponin. Has history of angina, will give NTG.   Code Status: Full code Family Communication: Spoke with her sister on 12/12/2014 Disposition Plan: Anticipate discharge home when medically stable   Antibiotics:  Levaquin  HPI/Subjective: Patient is a pleasant 49 year old female with a past medical history morbid obesity, obesity hypoventilation syndrome, chronic objective pulmonary disease, chronic diastolic congestive heart failure, admitted to the medicine service on 12/09/2014 when she presented with complaints of increasing shortness of breath. She was initially worked up with a chest x-ray which showed vascular congestion, bilateral central airspace opacities concerning for mild pulmonary edema, although pneumonia could also present with similar changes. She was started on empiric antibiotic therapy with Levaquin.  Objective: Filed Vitals:   12/13/14 1055  BP: 115/56  Pulse: 105  Temp: 98 F (36.7 C)  Resp: 24    Intake/Output Summary (Last 24 hours) at 12/13/14 1155 Last data filed at 12/12/14 2300  Gross per 24 hour  Intake    990 ml  Output   7550 ml  Net  -6560 ml   Filed Weights   12/09/14 1541  Weight: 134.265 kg (296 lb)    Exam:   General:  Patient reports having increasing SOB  Cardiovascular: Regular rate and rhythm normal S1-S2 no murmurs rubs or gallops  Respiratory: Diminished breath sounds bilaterally had few expiratory wheezes  Abdomen: Obese, soft nontender nondistended positive bowel sounds  Musculoskeletal: No edema  Data Reviewed: Basic Metabolic Panel:  Recent Labs Lab 12/09/14 0330 12/10/14 0423 12/11/14 0430 12/12/14 0448 12/12/14 1812 12/13/14 0820  NA 131* 135 133* 138  --  135  K 3.1* 5.2* 5.3* 4.0  --  3.8  CL 89* 97 98 95*  --  90*  CO2  29 27 26 30   --  36*  GLUCOSE 301* 294* 306* 294* 414* 158*  BUN 8 12 15 22   --  23  CREATININE 1.06 0.81 0.92 1.05  --  1.00  CALCIUM 8.0* 8.7 9.5 10.1  --  9.3    Liver Function Tests: No results for input(s): AST, ALT, ALKPHOS, BILITOT, PROT, ALBUMIN in the last 168 hours. No results for input(s): LIPASE, AMYLASE in the last 168 hours. No results for input(s): AMMONIA in the last 168 hours. CBC:  Recent Labs Lab 12/06/14 1311 12/09/14 0330 12/11/14 0430 12/12/14 0448 12/13/14 0820  WBC 5.4 6.7 6.7 9.2 8.5  HGB 13.2 14.2 12.1 13.5 13.2  HCT 41.5 44.2 38.3 42.7 41.9  MCV 79.2 81.1 82.0 81.8 82.3  PLT 152 168 171 171 134*   Cardiac Enzymes: No results for input(s): CKTOTAL, CKMB, CKMBINDEX, TROPONINI in the last 168 hours. BNP (last 3 results)  Recent Labs  10/18/14 1355 12/09/14 0330 12/13/14 0820  BNP 11.1 31.0 29.7    ProBNP (last 3 results)  Recent Labs  02/11/14 0021 04/17/14 0120 07/20/14 1817  PROBNP 32.8 44.7 39.7    CBG:  Recent Labs Lab 12/12/14 0753 12/12/14 1227 12/12/14 1719 12/12/14 2121 12/13/14 0736  GLUCAP 221* 293* 430* 289* 146*    Recent Results (from the past 240 hour(s))  Culture, blood (routine x 2)     Status: None (Preliminary result)   Collection Time: 12/09/14  9:45 AM  Result Value Ref Range Status   Specimen Description BLOOD LEFT ARM  Final   Special Requests BOTTLES DRAWN AEROBIC AND ANAEROBIC 8CC  Final   Culture   Final           BLOOD CULTURE RECEIVED NO GROWTH TO DATE CULTURE WILL BE HELD FOR 5 DAYS BEFORE ISSUING A FINAL NEGATIVE REPORT Performed at [facility]    Report Status PENDING  Incomplete  Culture, blood (routine x 2)     Status: None (Preliminary result)   Collection Time: 12/09/14  9:50 AM  Result Value Ref Range Status   Specimen Description BLOOD LEFT ARM  Final   Special Requests BOTTLES DRAWN AEROBIC ONLY 4CC  Final   Culture   Final           BLOOD CULTURE RECEIVED NO GROWTH TO DATE CULTURE WILL BE HELD FOR 5 DAYS BEFORE ISSUING A FINAL NEGATIVE REPORT Performed at [facility]    Report Status PENDING  Incomplete     Studies: Dg  Chest 2 View  12/13/2014   CLINICAL DATA:  Weakness and shortness of Breath  EXAM: CHEST  2 VIEW  COMPARISON:  12/11/2014  FINDINGS: Cardiac shadow is mildly enlarged. Patchy changes are noted in the bases bilaterally stable from the prior exam. No new focal infiltrate is seen. No sizable effusion is noted.  IMPRESSION: Patchy bibasilar changes.  No acute abnormality is noted.   Electronically Signed   By: [name] M.D.   On: 12/13/2014 11:07    Scheduled Meds: . albuterol  2.5 mg Nebulization Q6H  . ALPRAZolam  1 mg Oral TID  . antiseptic oral rinse  7 mL Mouth Rinse q12n4p  . aspirin EC  81 mg Oral Daily  . budesonide-formoterol  2 puff Inhalation BID  . chlorhexidine  15 mL Mouth Rinse BID  . enoxaparin (LOVENOX) injection  65 mg Subcutaneous Q24H  . fluticasone  2 spray Each Nare Daily  . gabapentin  300 mg Oral QHS  .  glimepiride  1 mg Oral Q breakfast  . guaiFENesin  1,200 mg Oral BID  . insulin aspart  0-20 Units Subcutaneous TID WC  . insulin aspart  0-5 Units Subcutaneous QHS  . insulin aspart  10 Units Subcutaneous TID WC  . insulin glargine  30 Units Subcutaneous BID  . levofloxacin  750 mg Oral Daily  . LORazepam  0.5 mg Intravenous Once  . nitroGLYCERIN      . pantoprazole  40 mg Oral Daily  . predniSONE  60 mg Oral Q breakfast  . tiotropium  18 mcg Inhalation Daily  . torsemide  100 mg Oral BID  . varenicline  0.5 mg Oral BID WC  . venlafaxine XR  37.5 mg Oral Q breakfast   Continuous Infusions:   Active Problems:   Hypokalemia   CAP (community acquired pneumonia)    Time spent: 35 minutes    Kelvin Cellar  [facility] Pager [phone]. If 7PM-7AM, please contact night-coverage at [email or web address], password [facility] 12/13/2014, 11:55 AM  LOS: 4 days

## 2014-12-13 NOTE — Progress Notes (Signed)
Spoke with pt regarding cpap.  Pt stated that she is fine and will self administer cpap when ready for bed.  Cpap settings are 12cm h2o per home regimen with full face mask.  Pt was advised that RT is available all night should she need any assistance.  RN notifed.

## 2014-12-14 ENCOUNTER — Observation Stay (HOSPITAL_COMMUNITY): Payer: Medicare Other

## 2014-12-14 ENCOUNTER — Other Ambulatory Visit (HOSPITAL_COMMUNITY): Payer: Self-pay

## 2014-12-14 DIAGNOSIS — J9601 Acute respiratory failure with hypoxia: Secondary | ICD-10-CM | POA: Diagnosis not present

## 2014-12-14 DIAGNOSIS — Z8249 Family history of ischemic heart disease and other diseases of the circulatory system: Secondary | ICD-10-CM

## 2014-12-14 DIAGNOSIS — J438 Other emphysema: Secondary | ICD-10-CM | POA: Diagnosis not present

## 2014-12-14 DIAGNOSIS — E785 Hyperlipidemia, unspecified: Secondary | ICD-10-CM | POA: Diagnosis not present

## 2014-12-14 DIAGNOSIS — I272 Other secondary pulmonary hypertension: Secondary | ICD-10-CM | POA: Diagnosis present

## 2014-12-14 DIAGNOSIS — Z7982 Long term (current) use of aspirin: Secondary | ICD-10-CM | POA: Diagnosis not present

## 2014-12-14 DIAGNOSIS — J9611 Chronic respiratory failure with hypoxia: Secondary | ICD-10-CM | POA: Diagnosis not present

## 2014-12-14 DIAGNOSIS — K219 Gastro-esophageal reflux disease without esophagitis: Secondary | ICD-10-CM | POA: Diagnosis not present

## 2014-12-14 DIAGNOSIS — R918 Other nonspecific abnormal finding of lung field: Secondary | ICD-10-CM | POA: Diagnosis not present

## 2014-12-14 DIAGNOSIS — Z888 Allergy status to other drugs, medicaments and biological substances status: Secondary | ICD-10-CM | POA: Diagnosis not present

## 2014-12-14 DIAGNOSIS — Z6841 Body Mass Index (BMI) 40.0 and over, adult: Secondary | ICD-10-CM | POA: Diagnosis not present

## 2014-12-14 DIAGNOSIS — J9819 Other pulmonary collapse: Secondary | ICD-10-CM | POA: Diagnosis not present

## 2014-12-14 DIAGNOSIS — D696 Thrombocytopenia, unspecified: Secondary | ICD-10-CM | POA: Diagnosis present

## 2014-12-14 DIAGNOSIS — E871 Hypo-osmolality and hyponatremia: Secondary | ICD-10-CM | POA: Diagnosis not present

## 2014-12-14 DIAGNOSIS — J441 Chronic obstructive pulmonary disease with (acute) exacerbation: Secondary | ICD-10-CM | POA: Diagnosis present

## 2014-12-14 DIAGNOSIS — R0789 Other chest pain: Secondary | ICD-10-CM | POA: Diagnosis not present

## 2014-12-14 DIAGNOSIS — R079 Chest pain, unspecified: Secondary | ICD-10-CM | POA: Diagnosis not present

## 2014-12-14 DIAGNOSIS — D869 Sarcoidosis, unspecified: Secondary | ICD-10-CM | POA: Diagnosis present

## 2014-12-14 DIAGNOSIS — M797 Fibromyalgia: Secondary | ICD-10-CM | POA: Diagnosis not present

## 2014-12-14 DIAGNOSIS — Z881 Allergy status to other antibiotic agents status: Secondary | ICD-10-CM | POA: Diagnosis not present

## 2014-12-14 DIAGNOSIS — Z882 Allergy status to sulfonamides status: Secondary | ICD-10-CM | POA: Diagnosis not present

## 2014-12-14 DIAGNOSIS — E875 Hyperkalemia: Secondary | ICD-10-CM | POA: Diagnosis present

## 2014-12-14 DIAGNOSIS — E662 Morbid (severe) obesity with alveolar hypoventilation: Secondary | ICD-10-CM | POA: Diagnosis not present

## 2014-12-14 DIAGNOSIS — F1010 Alcohol abuse, uncomplicated: Secondary | ICD-10-CM | POA: Diagnosis present

## 2014-12-14 DIAGNOSIS — J9600 Acute respiratory failure, unspecified whether with hypoxia or hypercapnia: Secondary | ICD-10-CM

## 2014-12-14 DIAGNOSIS — E1165 Type 2 diabetes mellitus with hyperglycemia: Secondary | ICD-10-CM | POA: Diagnosis not present

## 2014-12-14 DIAGNOSIS — T502X5A Adverse effect of carbonic-anhydrase inhibitors, benzothiadiazides and other diuretics, initial encounter: Secondary | ICD-10-CM | POA: Diagnosis not present

## 2014-12-14 DIAGNOSIS — I5033 Acute on chronic diastolic (congestive) heart failure: Secondary | ICD-10-CM | POA: Diagnosis not present

## 2014-12-14 DIAGNOSIS — K838 Other specified diseases of biliary tract: Secondary | ICD-10-CM | POA: Diagnosis not present

## 2014-12-14 DIAGNOSIS — E119 Type 2 diabetes mellitus without complications: Secondary | ICD-10-CM | POA: Diagnosis present

## 2014-12-14 DIAGNOSIS — J9620 Acute and chronic respiratory failure, unspecified whether with hypoxia or hypercapnia: Secondary | ICD-10-CM | POA: Diagnosis not present

## 2014-12-14 DIAGNOSIS — Y92239 Unspecified place in hospital as the place of occurrence of the external cause: Secondary | ICD-10-CM | POA: Diagnosis not present

## 2014-12-14 DIAGNOSIS — R1110 Vomiting, unspecified: Secondary | ICD-10-CM | POA: Diagnosis not present

## 2014-12-14 DIAGNOSIS — R0602 Shortness of breath: Secondary | ICD-10-CM | POA: Diagnosis not present

## 2014-12-14 DIAGNOSIS — I2781 Cor pulmonale (chronic): Secondary | ICD-10-CM | POA: Diagnosis present

## 2014-12-14 DIAGNOSIS — I10 Essential (primary) hypertension: Secondary | ICD-10-CM | POA: Diagnosis not present

## 2014-12-14 DIAGNOSIS — J9621 Acute and chronic respiratory failure with hypoxia: Secondary | ICD-10-CM | POA: Diagnosis not present

## 2014-12-14 DIAGNOSIS — R0902 Hypoxemia: Secondary | ICD-10-CM

## 2014-12-14 DIAGNOSIS — J690 Pneumonitis due to inhalation of food and vomit: Secondary | ICD-10-CM | POA: Diagnosis not present

## 2014-12-14 DIAGNOSIS — N179 Acute kidney failure, unspecified: Secondary | ICD-10-CM | POA: Diagnosis not present

## 2014-12-14 DIAGNOSIS — J189 Pneumonia, unspecified organism: Secondary | ICD-10-CM

## 2014-12-14 DIAGNOSIS — F1110 Opioid abuse, uncomplicated: Secondary | ICD-10-CM | POA: Diagnosis present

## 2014-12-14 DIAGNOSIS — K5900 Constipation, unspecified: Secondary | ICD-10-CM | POA: Diagnosis present

## 2014-12-14 DIAGNOSIS — J9690 Respiratory failure, unspecified, unspecified whether with hypoxia or hypercapnia: Secondary | ICD-10-CM | POA: Diagnosis not present

## 2014-12-14 DIAGNOSIS — E876 Hypokalemia: Secondary | ICD-10-CM | POA: Diagnosis not present

## 2014-12-14 DIAGNOSIS — G4733 Obstructive sleep apnea (adult) (pediatric): Secondary | ICD-10-CM | POA: Diagnosis not present

## 2014-12-14 DIAGNOSIS — I5032 Chronic diastolic (congestive) heart failure: Secondary | ICD-10-CM | POA: Diagnosis not present

## 2014-12-14 DIAGNOSIS — F329 Major depressive disorder, single episode, unspecified: Secondary | ICD-10-CM | POA: Diagnosis present

## 2014-12-14 DIAGNOSIS — F419 Anxiety disorder, unspecified: Secondary | ICD-10-CM | POA: Diagnosis present

## 2014-12-14 DIAGNOSIS — J984 Other disorders of lung: Secondary | ICD-10-CM | POA: Diagnosis not present

## 2014-12-14 DIAGNOSIS — T40601A Poisoning by unspecified narcotics, accidental (unintentional), initial encounter: Secondary | ICD-10-CM | POA: Diagnosis not present

## 2014-12-14 DIAGNOSIS — Z87891 Personal history of nicotine dependence: Secondary | ICD-10-CM | POA: Diagnosis not present

## 2014-12-14 DIAGNOSIS — Z794 Long term (current) use of insulin: Secondary | ICD-10-CM

## 2014-12-14 DIAGNOSIS — F17210 Nicotine dependence, cigarettes, uncomplicated: Secondary | ICD-10-CM | POA: Diagnosis present

## 2014-12-14 DIAGNOSIS — Z791 Long term (current) use of non-steroidal anti-inflammatories (NSAID): Secondary | ICD-10-CM | POA: Diagnosis not present

## 2014-12-14 DIAGNOSIS — Z7951 Long term (current) use of inhaled steroids: Secondary | ICD-10-CM

## 2014-12-14 DIAGNOSIS — Z79899 Other long term (current) drug therapy: Secondary | ICD-10-CM

## 2014-12-14 DIAGNOSIS — Z91040 Latex allergy status: Secondary | ICD-10-CM | POA: Diagnosis not present

## 2014-12-14 DIAGNOSIS — F411 Generalized anxiety disorder: Secondary | ICD-10-CM | POA: Diagnosis not present

## 2014-12-14 DIAGNOSIS — K760 Fatty (change of) liver, not elsewhere classified: Secondary | ICD-10-CM | POA: Diagnosis not present

## 2014-12-14 DIAGNOSIS — J9622 Acute and chronic respiratory failure with hypercapnia: Secondary | ICD-10-CM | POA: Diagnosis present

## 2014-12-14 DIAGNOSIS — R7989 Other specified abnormal findings of blood chemistry: Secondary | ICD-10-CM | POA: Diagnosis not present

## 2014-12-14 DIAGNOSIS — I5031 Acute diastolic (congestive) heart failure: Secondary | ICD-10-CM | POA: Diagnosis not present

## 2014-12-14 DIAGNOSIS — J449 Chronic obstructive pulmonary disease, unspecified: Secondary | ICD-10-CM | POA: Diagnosis not present

## 2014-12-14 LAB — BASIC METABOLIC PANEL
Anion gap: 12 (ref 5–15)
BUN: 26 mg/dL — AB (ref 6–23)
CHLORIDE: 86 mmol/L — AB (ref 96–112)
CO2: 35 mmol/L — ABNORMAL HIGH (ref 19–32)
Calcium: 8.6 mg/dL (ref 8.4–10.5)
Creatinine, Ser: 1.11 mg/dL — ABNORMAL HIGH (ref 0.50–1.10)
GFR calc Af Amer: 67 mL/min — ABNORMAL LOW (ref >90)
GFR calc non Af Amer: 58 mL/min — ABNORMAL LOW (ref >90)
GLUCOSE: 145 mg/dL — AB (ref 70–99)
Potassium: 3 mmol/L — ABNORMAL LOW (ref 3.5–5.1)
Sodium: 133 mmol/L — ABNORMAL LOW (ref 135–145)

## 2014-12-14 LAB — BLOOD GAS, ARTERIAL
Acid-Base Excess: 11.2 mmol/L — ABNORMAL HIGH (ref 0.0–2.0)
BICARBONATE: 37.5 meq/L — AB (ref 20.0–24.0)
Drawn by: 257701
FIO2: 0.5 %
O2 Saturation: 93.2 %
PH ART: 7.431 (ref 7.350–7.450)
PO2 ART: 71.2 mmHg — AB (ref 80.0–100.0)
Patient temperature: 98.6
TCO2: 33 mmol/L (ref 0–100)
pCO2 arterial: 57.4 mmHg (ref 35.0–45.0)

## 2014-12-14 LAB — GLUCOSE, CAPILLARY
Glucose-Capillary: 144 mg/dL — ABNORMAL HIGH (ref 70–99)
Glucose-Capillary: 323 mg/dL — ABNORMAL HIGH (ref 70–99)
Glucose-Capillary: 361 mg/dL — ABNORMAL HIGH (ref 70–99)

## 2014-12-14 LAB — INFLUENZA PANEL BY PCR (TYPE A & B)
H1N1 flu by pcr: NOT DETECTED
Influenza A By PCR: NEGATIVE
Influenza B By PCR: NEGATIVE

## 2014-12-14 LAB — TROPONIN I: Troponin I: 0.05 ng/mL — ABNORMAL HIGH (ref <0.031)

## 2014-12-14 LAB — MRSA PCR SCREENING: MRSA by PCR: POSITIVE — AB

## 2014-12-14 LAB — LACTIC ACID, PLASMA: Lactic Acid, Venous: 1.2 mmol/L (ref 0.5–2.0)

## 2014-12-14 MED ORDER — POTASSIUM CHLORIDE 10 MEQ/100ML IV SOLN
10.0000 meq | INTRAVENOUS | Status: DC
  Filled 2014-12-14 (×4): qty 100

## 2014-12-14 MED ORDER — POTASSIUM CHLORIDE CRYS ER 20 MEQ PO TBCR
40.0000 meq | EXTENDED_RELEASE_TABLET | Freq: Four times a day (QID) | ORAL | Status: AC
  Administered 2014-12-14 (×2): 40 meq via ORAL
  Filled 2014-12-14 (×2): qty 2

## 2014-12-14 MED ORDER — FUROSEMIDE 10 MG/ML IJ SOLN
40.0000 mg | Freq: Once | INTRAMUSCULAR | Status: DC
  Filled 2014-12-14: qty 4

## 2014-12-14 MED ORDER — FUROSEMIDE 10 MG/ML IJ SOLN
INTRAMUSCULAR | Status: AC
  Administered 2014-12-14: 40 mg
  Filled 2014-12-14: qty 4

## 2014-12-14 MED ORDER — PIPERACILLIN-TAZOBACTAM 3.375 G IVPB: 3.3750 g | Freq: Three times a day (TID) | INTRAVENOUS | Status: DC

## 2014-12-14 MED ORDER — MUPIROCIN 2 % EX OINT
1.0000 "application " | TOPICAL_OINTMENT | Freq: Two times a day (BID) | CUTANEOUS | Status: DC
  Administered 2014-12-14 – 2014-12-18 (×9): 1 via NASAL
  Filled 2014-12-14: qty 22

## 2014-12-14 MED ORDER — INSULIN ASPART 100 UNIT/ML ~~LOC~~ SOLN
14.0000 [IU] | Freq: Three times a day (TID) | SUBCUTANEOUS | Status: DC
  Administered 2014-12-14 – 2014-12-18 (×10): 14 [IU] via SUBCUTANEOUS

## 2014-12-14 MED ORDER — VANCOMYCIN HCL 10 G IV SOLR
2500.0000 mg | Freq: Once | INTRAVENOUS | Status: DC
  Filled 2014-12-14: qty 2500

## 2014-12-14 MED ORDER — CHLORHEXIDINE GLUCONATE CLOTH 2 % EX PADS
6.0000 | MEDICATED_PAD | Freq: Every day | CUTANEOUS | Status: DC
  Administered 2014-12-15: 6 via TOPICAL

## 2014-12-14 MED ORDER — ACETAMINOPHEN 325 MG PO TABS
650.0000 mg | ORAL_TABLET | Freq: Four times a day (QID) | ORAL | Status: DC | PRN
  Administered 2014-12-14 – 2014-12-17 (×2): 650 mg via ORAL
  Filled 2014-12-14 (×2): qty 2

## 2014-12-14 MED ORDER — VANCOMYCIN HCL IN DEXTROSE 1-5 GM/200ML-% IV SOLN: 1000.0000 mg | Freq: Two times a day (BID) | INTRAVENOUS | Status: DC

## 2014-12-14 MED ORDER — PIPERACILLIN-TAZOBACTAM 3.375 G IVPB 30 MIN
3.3750 g | Freq: Once | INTRAVENOUS | Status: DC
  Filled 2014-12-14: qty 50

## 2014-12-14 MED ORDER — PREDNISONE 20 MG PO TABS
40.0000 mg | ORAL_TABLET | Freq: Every day | ORAL | Status: DC
  Administered 2014-12-15: 40 mg via ORAL
  Filled 2014-12-14: qty 2

## 2014-12-14 NOTE — Progress Notes (Addendum)
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 Pt saturation at rest and on 4LO2 75-84%. RT was notified and pt was placed on a venturi mask at 50% FiO2. Prior to going to bed, pt removed venturi mask to get to bedside commode, SpO2 was 70% and pt was placed on CPAP where saturation was 95%, 4LO2. T.Callahan,NP notified about decrease in saturation. No orders to implement, will continue to monitor. Placed pt on continuous pulse ox.

## 2014-12-14 NOTE — Progress Notes (Signed)
Patient alert and oriented at change of shift. In bed with eyes close, CPAP in place. O2sat flucuating between 94-97%, 4LO2. Denied chest pain. Lung sounds diminished.

## 2014-12-14 NOTE — Progress Notes (Addendum)
[facility] PROGRESS NOTE  [name]:[id number] DOB: 06-30-1966 DOA: 12/09/2014 PCP: Gwendolyn Grant, MD  Interim summary Patient is a pleasant 49 year old female with a past medical history morbid obesity, obesity hypoventilation syndrome, chronic objective pulmonary disease, chronic diastolic congestive heart failure, admitted to the medicine service on 12/09/2014 when she presented with complaints of increasing shortness of breath. She was initially worked up with a chest x-ray which showed vascular congestion, bilateral central airspace opacities concerning for mild pulmonary edema, although pneumonia could also present with similar changes. She was started on empiric antibiotic therapy with Levaquin. On 12/14/2014 patient lethargic, hypoxemic, on noninvasive positive pressure ventilation, ABG showing a PCO2 of 57.4, increased from 47.4. Pulmonary critical care medicine consulted, transferred to step down unit for closer monitoring. Suspected to be secondary to oversedation from benzodiazepine and narcotic therapy received overnight along with CHF. She was given 40 mg of IV Lasix 1. Patient showing clinical improvement over the course of the morning in the step down unit.  Assessment/Plan: 1.  Acute on chronic hypoxemic hypercarbic respiratory failure -Patient lethargic this morning on my assessment this morning, on noninvasive positive pressure ventilation, repeat ABG showing PCO2 of 57.4 with PO2 of 71.2, previously 47.4 and 70 respectively on 12/09/2014. -Patient was transferred to the step down unit for closer monitoring -Pulmonary critical care medicine consulted, did not recommend CT or antimicrobial therapy, as respiratory failure likely due to oversedation from benzodiazepine and narcotic analgesics administered overnight along with CHF.  -Check a flu swab -She improved while in the stepdown unit. Continue close monitoring.   2.  Possible community acquired  pneumonia. -Patient presenting with complaints of cough associated with shortness of breath. Initial chest x-ray showed bilateral airspace opacities -Blood cultures obtained on 12/09/2014 showed no growth 2 sets -Patient receiving 5 days of Levaquin therapy. Pulmonary critical care medicine did not recommend further antibiotic therapy at this time  3.  Possible superimposed COPD exacerbation -Patient reports ongoing shortness of breath, having diminished breath sounds on lung exam -Continue oral prednisone at 40 mg by mouth daily  4.  Poorly controlled insulin dependent diabetes mellitus -Patient reporting taking Lantus 30 units subcutaneous changes twice a day at home. -During this hospitalization she was started on steroids with her blood sugars have been mostly in the 200 range -On Lantus to 30 units subcutaneous changes twice a day, continue monitoring blood sugars, mealtime coverage with 14 units of NovoLog, per diabetic coordinator recommendations  5.  Chronic diastolic congestive heart failure. -Last transthoracic echocardiogram performed on 11/21/2013 that showed an EF of 36-62%, grade 1 diastolic dysfunction -She was given dose of Lasix 40mg  IV x 1 today -Continue Torsemide 100 mg PO BID -Having good diuresis  6. Hypokalemia. -A.m. labs showing a potassium of 3.0, will replace with K Dur 40 mEq by mouth every 6 hours 2 doses  Code Status: Full code Family Communication: Spoke with her sister  Disposition Plan: Anticipate discharge home when medically stable   Antibiotics:  Levaquin DC'd on 12/14/2014  HPI/Subjective: Patient lethargic, difficult to arouse, descending to mid 57s when taken off of BiPAP. Transfer to stepdown unit.  Objective: Filed Vitals:   12/14/14 0914  BP: 130/50  Pulse: 102  Temp:   Resp: 21    Intake/Output Summary (Last 24 hours) at 12/14/14 1234 Last data filed at 12/14/14 1110  Gross per 24 hour  Intake   1420 ml  Output   6550 ml   Net  -5130 ml  Filed Weights   12/09/14 1541 12/13/14 1243  Weight: 134.265 kg (296 lb) 137.6 kg (303 lb 5.7 oz)    Exam:   General:  Patient lethargic, difficult to arouse, on BiPAP  Cardiovascular: Regular rate and rhythm normal S1-S2 no murmurs rubs or gallops  Respiratory: Diminished breath sounds bilaterally had few expiratory wheezes  Abdomen: Obese, soft nontender nondistended positive bowel sounds  Musculoskeletal: Has 1+ bilateral extremity edema  Data Reviewed: Basic Metabolic Panel:  Recent Labs Lab 12/10/14 0423 12/11/14 0430 12/12/14 0448 12/12/14 1812 12/13/14 0820 12/14/14 0430  NA 135 133* 138  --  135 133*  K 5.2* 5.3* 4.0  --  3.8 3.0*  CL 97 98 95*  --  90* 86*  CO2 27 26 30   --  36* 35*  GLUCOSE 294* 306* 294* 414* 158* 145*  BUN 12 15 22   --  23 26*  CREATININE 0.81 0.92 1.05  --  1.00 1.11*  CALCIUM 8.7 9.5 10.1  --  9.3 8.6   Liver Function Tests: No results for input(s): AST, ALT, ALKPHOS, BILITOT, PROT, ALBUMIN in the last 168 hours. No results for input(s): LIPASE, AMYLASE in the last 168 hours. No results for input(s): AMMONIA in the last 168 hours. CBC:  Recent Labs Lab 12/09/14 0330 12/11/14 0430 12/12/14 0448 12/13/14 0820  WBC 6.7 6.7 9.2 8.5  HGB 14.2 12.1 13.5 13.2  HCT 44.2 38.3 42.7 41.9  MCV 81.1 82.0 81.8 82.3  PLT 168 171 171 134*   Cardiac Enzymes:  Recent Labs Lab 12/14/14 0921  TROPONINI 0.05*   BNP (last 3 results)  Recent Labs  10/18/14 1355 12/09/14 0330 12/13/14 0820  BNP 11.1 31.0 29.7    ProBNP (last 3 results)  Recent Labs  02/11/14 0021 04/17/14 0120 07/20/14 1817  PROBNP 32.8 44.7 39.7    CBG:  Recent Labs Lab 12/12/14 2121 12/13/14 0736 12/13/14 1241 12/13/14 1752 12/13/14 2120  GLUCAP 289* 146* 234* 335* 267*    Recent Results (from the past 240 hour(s))  Culture, blood (routine x 2)     Status: None (Preliminary result)   Collection Time: 12/09/14  9:45 AM   Result Value Ref Range Status   Specimen Description BLOOD LEFT ARM  Final   Special Requests BOTTLES DRAWN AEROBIC AND ANAEROBIC 8CC  Final   Culture   Final           BLOOD CULTURE RECEIVED NO GROWTH TO DATE CULTURE WILL BE HELD FOR 5 DAYS BEFORE ISSUING A FINAL NEGATIVE REPORT Performed at [facility]    Report Status PENDING  Incomplete  Culture, blood (routine x 2)     Status: None (Preliminary result)   Collection Time: 12/09/14  9:50 AM  Result Value Ref Range Status   Specimen Description BLOOD LEFT ARM  Final   Special Requests BOTTLES DRAWN AEROBIC ONLY 4CC  Final   Culture   Final           BLOOD CULTURE RECEIVED NO GROWTH TO DATE CULTURE WILL BE HELD FOR 5 DAYS BEFORE ISSUING A FINAL NEGATIVE REPORT Performed at [facility]    Report Status PENDING  Incomplete  MRSA PCR Screening     Status: Abnormal   Collection Time: 12/14/14  9:30 AM  Result Value Ref Range Status   MRSA by PCR POSITIVE (A) NEGATIVE Final    Comment:        The GeneXpert MRSA Assay (FDA approved for NASAL specimens only), is  one component of a comprehensive MRSA colonization surveillance program. It is not intended to diagnose MRSA infection nor to guide or monitor treatment for MRSA infections. RESULT CALLED TO, READ BACK BY AND VERIFIED WITH: MAIN,S @ 1215 ON [id number] BY POTEAT,S      Studies: Dg Chest 2 View  12/13/2014   CLINICAL DATA:  Weakness and shortness of Breath  EXAM: CHEST  2 VIEW  COMPARISON:  12/11/2014  FINDINGS: Cardiac shadow is mildly enlarged. Patchy changes are noted in the bases bilaterally stable from the prior exam. No new focal infiltrate is seen. No sizable effusion is noted.  IMPRESSION: Patchy bibasilar changes.  No acute abnormality is noted.   Electronically Signed   By: [name] M.D.   On: 12/13/2014 11:07   Dg Chest Port 1 View  12/14/2014   CLINICAL DATA:  Shortness of breath and chest pain  EXAM: PORTABLE CHEST - 1 VIEW  COMPARISON:   12/13/2014  FINDINGS: Cardiac shadow remains enlarged. Patchy infiltrate is noted in the right lung base. Some diffuse interstitial changes are noted bilaterally which may be related to superimposed congestive failure. This is more prominent than that seen on the prior exam.  IMPRESSION: Stable patchy right basilar infiltrate.  Increasing vascular congestion and interstitial edema.   Electronically Signed   By: [name] M.D.   On: 12/14/2014 10:57    Scheduled Meds: . albuterol  2.5 mg Nebulization TID  . ALPRAZolam  1 mg Oral TID  . antiseptic oral rinse  7 mL Mouth Rinse q12n4p  . aspirin EC  81 mg Oral Daily  . budesonide-formoterol  2 puff Inhalation BID  . chlorhexidine  15 mL Mouth Rinse BID  . enoxaparin (LOVENOX) injection  65 mg Subcutaneous Q24H  . fluticasone  2 spray Each Nare Daily  . furosemide      . furosemide  40 mg Intravenous Once  . gabapentin  300 mg Oral QHS  . glimepiride  1 mg Oral Q breakfast  . guaiFENesin  1,200 mg Oral BID  . insulin aspart  0-20 Units Subcutaneous TID WC  . insulin aspart  0-5 Units Subcutaneous QHS  . insulin aspart  10 Units Subcutaneous TID WC  . insulin glargine  30 Units Subcutaneous BID  . pantoprazole  40 mg Oral Daily  . potassium chloride  40 mEq Oral Q6H  . predniSONE  60 mg Oral Q breakfast  . tiotropium  18 mcg Inhalation Daily  . torsemide  100 mg Oral BID  . varenicline  0.5 mg Oral BID WC  . venlafaxine XR  37.5 mg Oral Q breakfast   Continuous Infusions:   Active Problems:   Hypokalemia   CAP (community acquired pneumonia)    Time spent: 64 minutes    Kelvin Cellar  [facility] Pager [phone]. If 7PM-7AM, please contact night-coverage at [email or web address], password [facility] 12/14/2014, 12:34 PM  LOS: 5 days

## 2014-12-14 NOTE — Progress Notes (Signed)
Patient transferred to room 1224, ICU. Left unit at 0900 via bed; accompanied by Probation officer, Personal assistant and NT. Vitals prior to transport: 95/51, 103, 20, 92%, venti mask @ 50%.

## 2014-12-14 NOTE — Consult Note (Addendum)
PULMONARY / CRITICAL CARE MEDICINE   Name: [name] MRN: [id number] DOB: 06/14/1966    ADMISSION DATE:  12/09/2014 CONSULTATION DATE:  3/21  REFERRING MD :  [facility]  CHIEF COMPLAINT:  SOB  INITIAL PRESENTATION: sob  STUDIES:    SIGNIFICANT EVENTS: 3/21 desaturated after oversedation   HISTORY OF PRESENT ILLNESS:   [name] is a MO(303 lb) with known OSA and is Cpap dependent and compliant who was admitted 3/16 with SOB/Htn and anxiety. CAP was suspected but not supported by CxR or culture data  but was on steroids and Cefepime. Early am 3/21 was noted to be desaturating and lethargic. Review of medications reveals: Xanax 1mg  at 3/30 2233, Ativan 0.5 mg IV at 3/20 2230, Oxy IR at 3/21 at 0335. Transferred to ICU 3/21 at 0910 for AMS and desaturations after being taken off her Cpap.PCCM asked to evaluate in the ICU for questionable pulmonary embolism. Currently 3/21 10:15 on [place] and sats 94%, awake and alert. Note PMH is significant for pulmonary sarcoid. She is followed by Dr. [name] for OSA/OHS COPD and chronic resp failure. Continues to smoke despite the severity of her lung disease. PAST MEDICAL HISTORY :   has a past medical history of Hypertension; Hyperlipidemia; Chronic headache; Fibromyalgia; Anxiety; Anemia; Pulmonary sarcoidosis; Colonic polyp; GERD (gastroesophageal reflux disease); ALLERGIC RHINITIS; Asthma; CHF (congestive heart failure); Morbid obesity; Depression; Panic attacks; Diabetes mellitus; COPD (chronic obstructive pulmonary disease); Obesity; Elevated LFTs (09/2011); Ovarian cyst; Chronic back pain; Sleep apnea; Fatty liver; Esophagitis; Gastritis; Internal hemorrhoids; and Adenomatous colon polyp (12/03/09).  has past surgical history that includes Polypectomy (2011); Lumbar microdiscectomy (07/06/2011); Back surgery; Carpal tunnel release; steroid spinal injections; Hysteroscopy w/D&C (N/A, 03/25/2013); and Dilation and curettage of uterus. Prior to Admission  medications   Medication Sig Start Date End Date Taking? Authorizing Provider  acetaminophen (TYLENOL) 325 MG tablet Take 1,300 mg by mouth every 6 (six) hours as needed.   Yes Historical Provider, MD  albuterol (PROVENTIL) (2.5 MG/3ML) 0.083% nebulizer solution Take 3 mLs (2.5 mg total) by nebulization every 6 (six) hours as needed for wheezing or shortness of breath. 12/08/14  Yes Rowe Clack, MD  ALPRAZolam Duanne Moron) 1 MG tablet Take 1 mg by mouth 3 (three) times daily.   Yes Historical Provider, MD  aspirin EC 81 MG tablet Take 81 mg by mouth daily.   Yes Historical Provider, MD  BIOTIN PO Take 1 capsule by mouth daily.   Yes Historical Provider, MD  CAPSAICIN EX Apply 1 application topically as needed (arthritis pain).   Yes Historical Provider, MD  cetirizine (ZYRTEC) 1 MG/ML syrup TAKE 10 ML BY MOUTH EVERY NIGHT AT BEDTIME 09/07/14  Yes Rowe Clack, MD  Coenzyme Q10 (CO Q 10) 10 MG CAPS Take 1 capsule by mouth daily.   Yes Historical Provider, MD  esomeprazole (NEXIUM) 40 MG capsule Take 40 mg by mouth 2 (two) times daily before a meal.   Yes Historical Provider, MD  fluticasone (FLONASE) 50 MCG/ACT nasal spray Place 2 sprays into both nostrils daily. 10/19/14  Yes Rowe Clack, MD  gabapentin (NEURONTIN) 300 MG capsule Take 300 mg by mouth at bedtime.   Yes Historical Provider, MD  glimepiride (AMARYL) 1 MG tablet Take 1 mg by mouth daily with breakfast.   Yes Historical Provider, MD  hydrOXYzine (ATARAX/VISTARIL) 10 MG tablet TAKE 1 TABLET BY MOUTH THREE TIMES DAILY AS NEEDED FOR ITCHING 08/27/14  Yes Biagio Borg, MD  ibuprofen (ADVIL,MOTRIN) 200 MG  tablet Take 800 mg by mouth every 6 (six) hours as needed for moderate pain.   Yes Historical Provider, MD  insulin aspart (NOVOLOG FLEXPEN) 100 UNIT/ML FlexPen Use as directed per sliding scale indicated by your physician. Patient taking differently: Inject 1-11 Units into the skin 3 (three) times daily with meals. Use as  directed per sliding scale indicated by your physician. 09/22/14  Yes Rowe Clack, MD  Insulin Glargine (LANTUS SOLOSTAR) 100 UNIT/ML Solostar Pen Inject 16 Units into the skin daily at 10 pm. Patient taking differently: Inject 30 Units into the skin 2 (two) times daily.  11/27/14  Yes Rowe Clack, MD  Multiple Vitamin (MULTIVITAMIN) capsule Take 1 capsule by mouth daily.   Yes Historical Provider, MD  naproxen sodium (ANAPROX) 220 MG tablet Take 440 mg by mouth 2 (two) times daily as needed (pain).   Yes Historical Provider, MD  omega-3 fish oil (MAXEPA) 1000 MG CAPS capsule Take 1 capsule by mouth daily.   Yes Historical Provider, MD  potassium chloride SA (K-DUR,KLOR-CON) 20 MEQ tablet Take 2 tablets (40 mEq total) by mouth 2 (two) times daily. 11/13/14  Yes Rowe Clack, MD  PROAIR HFA 108 (90 BASE) MCG/ACT inhaler INHALE 2 PUFFS BY MOUTH EVERY 6 HOURS AS NEEDED 08/28/14  Yes Rowe Clack, MD  SPIRIVA HANDIHALER 18 MCG inhalation capsule INHALE CONTENTS OF 1 CAPSULE USING HANDIHALER EVERY DAY 08/24/14  Yes Kathee Delton, MD  SYMBICORT 160-4.5 MCG/ACT inhaler INHALE 2 PUFFS BY MOUTH TWICE DAILY 10/19/14  Yes Kathee Delton, MD  tiZANidine (ZANAFLEX) 4 MG tablet TAKE 1 TABLET BY MOUTH EVERY 8 HOURS AS NEEDED FOR MUSCLE SPASMS 11/10/14  Yes Rowe Clack, MD  torsemide (DEMADEX) 100 MG tablet Take 1 tablet (100 mg total) by mouth 2 (two) times daily. 07/30/14  Yes Biagio Borg, MD  varenicline (CHANTIX) 0.5 MG tablet Take 1 tablet (0.5 mg total) by mouth 2 (two) times daily. 11/03/14  Yes Golden Circle, FNP  venlafaxine XR (EFFEXOR XR) 37.5 MG 24 hr capsule Take 1 capsule (37.5 mg total) by mouth daily with breakfast. 11/13/14  Yes Rowe Clack, MD  acidophilus (RISAQUAD) CAPS capsule Take 1 capsule by mouth daily. Patient not taking: Reported on 12/06/2014 11/30/14   Rowe Clack, MD  Insulin Pen Needle 31G X 8 MM MISC Use to administer insulin (Solostar Pen).  09/28/14   Rowe Clack, MD  ONE TOUCH ULTRA TEST test strip  11/01/14   Historical Provider, MD   Allergies  Allergen Reactions  . Simvastatin Other (See Comments)    Severe leg pain and burning  . Sulfamethoxazole-Trimethoprim Nausea And Vomiting    Causes Projectile Vomiting  . Zolpidem Tartrate Other (See Comments)    Chest pain  . Azithromycin Other (See Comments)    Resistant to med  . Doxycycline Other (See Comments)    Resistant to med  . Latex Itching and Rash    Also burning sensations  . Metformin And Related Diarrhea    Severe diarrhea  . Metronidazole Nausea And Vomiting  . Ceftin [Cefuroxime]     MOUTH SWELLING  . Metolazone Other (See Comments)    Cramps,pain  . Ciprofloxacin Nausea Only  . Tramadol Itching and Nausea Only    FAMILY HISTORY:  indicated that her mother is deceased. She indicated that her father is deceased. She indicated that her brother is deceased.  SOCIAL HISTORY:  reports that she quit smoking about 7  weeks ago. Her smoking use included Cigarettes. She has a 7.25 pack-year smoking history. She has never used smokeless tobacco. She reports that she drinks alcohol. She reports that she uses illicit drugs (Cocaine and Marijuana).  REVIEW OF SYSTEMS:  10 point review of system taken, please see HPI for positives and negatives.   SUBJECTIVE:   VITAL SIGNS: Temp:  [97.9 F (36.6 C)-98 F (36.7 C)] 97.9 F (36.6 C) (03/21 0603) Pulse Rate:  [87-105] 102 (03/21 0914) Resp:  [20-24] 21 (03/21 0914) BP: (115-141)/(50-89) 130/50 mmHg (03/21 0914) SpO2:  [86 %-96 %] 93 % (03/21 0914) FiO2 (%):  [50 %] 50 % (03/20 2134) Weight:  [303 lb 5.7 oz (137.6 kg)] 303 lb 5.7 oz (137.6 kg) (03/20 1243) HEMODYNAMICS:   VENTILATOR SETTINGS: Vent Mode:  [-]  FiO2 (%):  [50 %] 50 % INTAKE / OUTPUT:  Intake/Output Summary (Last 24 hours) at 12/14/14 0956 Last data filed at 12/14/14 0603  Gross per 24 hour  Intake   1780 ml  Output   8800 ml  Net   -7020 ml    PHYSICAL EXAMINATION: General:  MOAAF NAD, follows commands on [place] [facility]:  Intact, little sleepy Head: [place]/AT Eyes: PERRL, EOM-I. Neck:  No neck/JVD Cardiovascular:  HSR RRR Lungs:  Decreased air movement, no wheezes Abdomen:  Obese hernia noted Musculoskeletal:  intact  Skin: warm  LABS:  CBC  Recent Labs Lab 12/11/14 0430 12/12/14 0448 12/13/14 0820  WBC 6.7 9.2 8.5  HGB 12.1 13.5 13.2  HCT 38.3 42.7 41.9  PLT 171 171 134*   Coag's No results for input(s): APTT, INR in the last 168 hours. BMET  Recent Labs Lab 12/12/14 0448 12/12/14 1812 12/13/14 0820 12/14/14 0430  NA 138  --  135 133*  K 4.0  --  3.8 3.0*  CL 95*  --  90* 86*  CO2 30  --  36* 35*  BUN 22  --  23 26*  CREATININE 1.05  --  1.00 1.11*  GLUCOSE 294* 414* 158* 145*   Electrolytes  Recent Labs Lab 12/12/14 0448 12/13/14 0820 12/14/14 0430  CALCIUM 10.1 9.3 8.6   Sepsis Markers  Recent Labs Lab 12/09/14 0840 12/10/14 0848 12/14/14 0921  LATICACIDVEN 2.51* 1.6 1.2   ABG  Recent Labs Lab 12/09/14 1140 12/14/14 0901  PHART 7.420 7.431  PCO2ART 47.4* 57.4*  PO2ART 70.0* 71.2*   Liver Enzymes No results for input(s): AST, ALT, ALKPHOS, BILITOT, ALBUMIN in the last 168 hours. Cardiac Enzymes No results for input(s): TROPONINI, PROBNP in the last 168 hours. Glucose  Recent Labs Lab 12/12/14 1719 12/12/14 2121 12/13/14 0736 12/13/14 1241 12/13/14 1752 12/13/14 2120  GLUCAP 430* 289* 146* 234* 335* 267*    Imaging Dg Chest 2 View  12/13/2014   CLINICAL DATA:  Weakness and shortness of Breath  EXAM: CHEST  2 VIEW  COMPARISON:  12/11/2014  FINDINGS: Cardiac shadow is mildly enlarged. Patchy changes are noted in the bases bilaterally stable from the prior exam. No new focal infiltrate is seen. No sizable effusion is noted.  IMPRESSION: Patchy bibasilar changes.  No acute abnormality is noted.   Electronically Signed   By: [name] M.D.   On: 12/13/2014 11:07      ASSESSMENT / PLAN:  PULMONARY OETT A: OSA OHS COPD Pulmonary sarcoid Tobacco abuse Resp distress magnified by oversedation P:   Cpap prn and nocturnal Decrease sedatives and analgesics BD's as needed O2 as needed I don't think CT  chest is needed at this time Gentle diuresis  CARDIOVASCULAR CVL A:  HTN Grade 1 diastolic dysfunction Volume overlaod P:  Antihypertensives Diuresis  RENAL Lab Results  Component Value Date   CREATININE 1.11* 12/14/2014   CREATININE 1.00 12/13/2014   CREATININE 1.05 12/12/2014    A:   No acute issue P:   Careful diuresis  GASTROINTESTINAL A:   MO P:   Wt loss  HEMATOLOGIC A:   Sarcoid P:    INFECTIOUS A:   Given vanc and Zoysn but would dc P:     ENDOCRINE A:   DM P:   SSI  NEUROLOGIC A:   Obtruded  P:   RASS goal: 1 Awake and alert Hold anxiolytics and narcotics.   FAMILY  - Updates:   - Inter-disciplinary family meet or Palliative Care meeting due by:  day 7    TODAY'S SUMMARY:  [name] is a MO(303 lb) with known OSA and is Cpap dependent and compliant who was admitted 3/16 with SOB/Htn and anxiety. CAP was suspected but not supported by CxR or culture data  but was on steroids and Cefepime. Early am 3/21 was noted to be desaturating and lethargic. Review of medications reveals: Xanax 1mg  at 3/30 2233, Ativan 0.5 mg IV at 3/20 2230, Oxy IR at 3/21 at 0335. Transferred to ICU 3/21 at 0910 for AMS and desaturations after being taken off her Cpap.PCCM asked to evaluate in the ICU for questionable pulmonary embolism. Currently 3/21 10:15 on [place] and sats 94%, awake and alert. Note PMH is significant for pulmonary sarcoid. She is followed by Dr. [name] for OSA/OHS COPD and chronic resp failure. Continues to smoke despite the severity of her lung disease.  [name] ACNP [facility] Pager [phone] till 3 pm If no answer page [phone]  Patient is no longer desaturating, back to baseline O2  needs as in at home, highly doubtful that this is a PE, patient was likely over sedate.  Appears well now.  No indication for BiPAP at this time.  Change to CPAP at night.  D/C CTA for PE.  Titrate O2 down for sat of 88-92%, IS per RT protocol.  I reviewed the CXR myself, indicating only pulmonary edema.  I discussed case with NP and plan as above.  I see no indications for abx at this time but will defer to primary.  Recommend keeping on the dry side and will give lasix.  Patient seen and examined, agree with above note.  I dictated the care and orders written for this patient under my direction.  Rush Farmer, MD [phone]  12/14/2014, 10:16 AM

## 2014-12-14 NOTE — Progress Notes (Signed)
UR completed 

## 2014-12-14 NOTE — Progress Notes (Signed)
ANTIBIOTIC CONSULT NOTE - INITIAL  Pharmacy Consult for vanc/Zosyn Indication: pneumonia  Allergies  Allergen Reactions  . Simvastatin Other (See Comments)    Severe leg pain and burning  . Sulfamethoxazole-Trimethoprim Nausea And Vomiting    Causes Projectile Vomiting  . Zolpidem Tartrate Other (See Comments)    Chest pain  . Azithromycin Other (See Comments)    Resistant to med  . Doxycycline Other (See Comments)    Resistant to med  . Latex Itching and Rash    Also burning sensations  . Metformin And Related Diarrhea    Severe diarrhea  . Metronidazole Nausea And Vomiting  . Ceftin [Cefuroxime]     MOUTH SWELLING  . Metolazone Other (See Comments)    Cramps,pain  . Ciprofloxacin Nausea Only  . Tramadol Itching and Nausea Only    Patient Measurements: Height: 5\' 8"  (172.7 cm) Weight: (!) 303 lb 5.7 oz (137.6 kg) IBW/kg (Calculated) : 63.9 Adjusted Body Weight: n/a  Vital Signs: Temp: 97.9 F (36.6 C) (03/21 0603) Temp Source: Oral (03/21 0603) BP: 130/50 mmHg (03/21 0914) Pulse Rate: 102 (03/21 0914) Intake/Output from previous day: 03/20 0701 - 03/21 0700 In: 2260 [P.O.:2260] Out: 10800 [Urine:10800] Intake/Output from this shift:    Labs:  Recent Labs  12/12/14 0448 12/13/14 0820 12/14/14 0430  WBC 9.2 8.5  --   HGB 13.5 13.2  --   PLT 171 134*  --   CREATININE 1.05 1.00 1.11*   Estimated Creatinine Clearance: 92.4 mL/min (by C-G formula based on Cr of 1.11). No results for input(s): VANCOTROUGH, VANCOPEAK, VANCORANDOM, GENTTROUGH, GENTPEAK, GENTRANDOM, TOBRATROUGH, TOBRAPEAK, TOBRARND, AMIKACINPEAK, AMIKACINTROU, AMIKACIN in the last 72 hours.   Microbiology: Recent Results (from the past 720 hour(s))  Culture, blood (routine x 2)     Status: None (Preliminary result)   Collection Time: 12/09/14  9:45 AM  Result Value Ref Range Status   Specimen Description BLOOD LEFT ARM  Final   Special Requests BOTTLES DRAWN AEROBIC AND ANAEROBIC 8CC   Final   Culture   Final           BLOOD CULTURE RECEIVED NO GROWTH TO DATE CULTURE WILL BE HELD FOR 5 DAYS BEFORE ISSUING A FINAL NEGATIVE REPORT Performed at [facility]    Report Status PENDING  Incomplete  Culture, blood (routine x 2)     Status: None (Preliminary result)   Collection Time: 12/09/14  9:50 AM  Result Value Ref Range Status   Specimen Description BLOOD LEFT ARM  Final   Special Requests BOTTLES DRAWN AEROBIC ONLY 4CC  Final   Culture   Final           BLOOD CULTURE RECEIVED NO GROWTH TO DATE CULTURE WILL BE HELD FOR 5 DAYS BEFORE ISSUING A FINAL NEGATIVE REPORT Performed at [facility]    Report Status PENDING  Incomplete    Medical History: Past Medical History  Diagnosis Date  . Hypertension   . Hyperlipidemia   . Chronic headache   . Fibromyalgia     daily narcotics  . Anxiety     hx chronic BZ use, stopped 07/2010  . Anemia   . Pulmonary sarcoidosis     unimpressive CT chest 2011  . Colonic polyp   . GERD (gastroesophageal reflux disease)   . ALLERGIC RHINITIS   . Asthma   . CHF (congestive heart failure)     Diastolic with fluid overload, May, 2012, LVEF 60%  . Morbid obesity   .  Depression   . Panic attacks   . Diabetes mellitus   . COPD (chronic obstructive pulmonary disease)     on home O2, moderate airflow obstruction, suspect d/t emphysema  . Obesity   . Elevated LFTs 09/2011  . Ovarian cyst   . Chronic back pain   . Sleep apnea      CPAP  . Fatty liver   . Esophagitis   . Gastritis   . Internal hemorrhoids   . Adenomatous colon polyp 12/03/09    [facility] in [place], [place]    Medications:  Scheduled:  . albuterol  2.5 mg Nebulization TID  . ALPRAZolam  1 mg Oral TID  . antiseptic oral rinse  7 mL Mouth Rinse q12n4p  . aspirin EC  81 mg Oral Daily  . budesonide-formoterol  2 puff Inhalation BID  . chlorhexidine  15 mL Mouth Rinse BID  . enoxaparin (LOVENOX) injection  65 mg Subcutaneous Q24H  .  fluticasone  2 spray Each Nare Daily  . gabapentin  300 mg Oral QHS  . glimepiride  1 mg Oral Q breakfast  . guaiFENesin  1,200 mg Oral BID  . insulin aspart  0-20 Units Subcutaneous TID WC  . insulin aspart  0-5 Units Subcutaneous QHS  . insulin aspart  10 Units Subcutaneous TID WC  . insulin glargine  30 Units Subcutaneous BID  . pantoprazole  40 mg Oral Daily  . piperacillin-tazobactam  3.375 g Intravenous Once  . piperacillin-tazobactam (ZOSYN)  IV  3.375 g Intravenous Q8H  . potassium chloride  10 mEq Intravenous Q1 Hr x 4  . predniSONE  60 mg Oral Q breakfast  . tiotropium  18 mcg Inhalation Daily  . torsemide  100 mg Oral BID  . vancomycin  2,500 mg Intravenous Once  . vancomycin  1,000 mg Intravenous Q12H  . varenicline  0.5 mg Oral BID WC  . venlafaxine XR  37.5 mg Oral Q breakfast   Infusions:   PRN: albuterol, hydrOXYzine, naproxen, nitroGLYCERIN, oxyCODONE, tiZANidine   Assessment 48yo obese female admitted for CAP +/- AECOPD, started on Levaquin.  Worsened overnight w/ decreased sats, SOB.  MD broadening to vanc/Zosyn per pharmacy on 3/21.  PMH morbid obesity w/ hypoventilation, COPD, HFpEF, DM.    **Originally consulted for cefepime, but noted recent mouth swelling from Ceftin in Feb 2016 - upon further questioning patient states swelling was in roof of mouth and denied any SOB or swelling of tongue/throat.  D/w MD and changed to Zosyn; alerted RN to monitor for possible allergic reaction.**  3/16 >> Levaquin >> 3/21 3/21 >> Zosyn >>   3/21 >> vancomycin >>  Tmax: AF WBCs: wnl Renal: SCr wnl but slowly trending up; CrCl > 90 CG; 71N  3/16 blood: NGTD x2   Goal of Therapy:   Vancomycin trough level 15-20 mcg/ml   Eradication of infection  Appropriate antibiotic dosing for indication and renal function  Plan:   Vancomycin 2500 mg IV x 1, then 1000 mg IV q12h  Zosyn 3.375 g IV x 1 over 30 min, then q8h by 4-hr infusion.  Monitor for allergic  reaction.  Noted previous high vanc trough in 2011, unable to see vanc regimen but SCr similar.  Would check trough before 4th dose.  Follow clinical course, culture results as available, renal function  Follow for de-escalation of antibiotics and LOT   Reuel Boom, PharmD Pager: [phone] 12/14/2014, 10:19 AM

## 2014-12-14 NOTE — Progress Notes (Signed)
Inpatient Diabetes Program Recommendations  AACE/ADA: New Consensus Statement on Inpatient Glycemic Control (2013)  Target Ranges:  Prepandial:   less than 140 mg/dL      Peak postprandial:   less than 180 mg/dL (1-2 hours)      Critically ill patients:  140 - 180 mg/dL   Reason for Assessment:  Results for HARPER, VANDERVOORT (MRN [id number]) as of 12/14/2014 10:24  Ref. Range 12/13/2014 07:36 12/13/2014 12:41 12/13/2014 17:52 12/13/2014 21:20  Glucose-Capillary Latest Range: 70-99 mg/dL 146 (H) 234 (H) 335 (H) 267 (H)   CBG's continue to increased throughout the day.  Note that patient is receiving Prednisone 60 mg daily which is likely increasing post-prandial blood sugars.  While on PO Prednisone, may consider increasing Novolog meal coverage to 14 units tid with meals.  As steroids are tapered, may also require taper of insulin.  Thanks, Adah Perl, RN, BC-ADM Inpatient Diabetes Coordinator Pager [phone] (8a-5p)

## 2014-12-15 ENCOUNTER — Inpatient Hospital Stay (HOSPITAL_COMMUNITY): Payer: Medicare Other

## 2014-12-15 ENCOUNTER — Other Ambulatory Visit (HOSPITAL_COMMUNITY): Payer: Self-pay

## 2014-12-15 ENCOUNTER — Ambulatory Visit: Payer: Self-pay | Admitting: Internal Medicine

## 2014-12-15 DIAGNOSIS — J9622 Acute and chronic respiratory failure with hypercapnia: Secondary | ICD-10-CM

## 2014-12-15 DIAGNOSIS — E876 Hypokalemia: Secondary | ICD-10-CM

## 2014-12-15 LAB — CBC
HCT: 42.1 % (ref 36.0–46.0)
HEMATOCRIT: 41.7 % (ref 36.0–46.0)
Hemoglobin: 13.5 g/dL (ref 12.0–15.0)
Hemoglobin: 13.8 g/dL (ref 12.0–15.0)
MCH: 26.3 pg (ref 26.0–34.0)
MCH: 26.9 pg (ref 26.0–34.0)
MCHC: 32.4 g/dL (ref 30.0–36.0)
MCHC: 32.8 g/dL (ref 30.0–36.0)
MCV: 81.1 fL (ref 78.0–100.0)
MCV: 82.1 fL (ref 78.0–100.0)
PLATELETS: 76 10*3/uL — AB (ref 150–400)
PLATELETS: 78 10*3/uL — AB (ref 150–400)
RBC: 5.14 MIL/uL — ABNORMAL HIGH (ref 3.87–5.11)
RBC: 5.13 MIL/uL — AB (ref 3.87–5.11)
RDW: 19.5 % — AB (ref 11.5–15.5)
RDW: 19.7 % — ABNORMAL HIGH (ref 11.5–15.5)
WBC: 11.2 10*3/uL — AB (ref 4.0–10.5)
WBC: 11.2 10*3/uL — AB (ref 4.0–10.5)

## 2014-12-15 LAB — CULTURE, BLOOD (ROUTINE X 2)
Culture: NO GROWTH
Culture: NO GROWTH

## 2014-12-15 LAB — GLUCOSE, CAPILLARY
GLUCOSE-CAPILLARY: 237 mg/dL — AB (ref 70–99)
Glucose-Capillary: 205 mg/dL — ABNORMAL HIGH (ref 70–99)
Glucose-Capillary: 186 mg/dL — ABNORMAL HIGH (ref 70–99)
Glucose-Capillary: 245 mg/dL — ABNORMAL HIGH (ref 70–99)

## 2014-12-15 LAB — BASIC METABOLIC PANEL
ANION GAP: 9 (ref 5–15)
BUN: 29 mg/dL — AB (ref 6–23)
CO2: 33 mmol/L — AB (ref 19–32)
Calcium: 8.3 mg/dL — ABNORMAL LOW (ref 8.4–10.5)
Chloride: 93 mmol/L — ABNORMAL LOW (ref 96–112)
Creatinine, Ser: 1.18 mg/dL — ABNORMAL HIGH (ref 0.50–1.10)
GFR calc Af Amer: 63 mL/min — ABNORMAL LOW (ref >90)
GFR calc non Af Amer: 54 mL/min — ABNORMAL LOW (ref >90)
Glucose, Bld: 231 mg/dL — ABNORMAL HIGH (ref 70–99)
Potassium: 3.6 mmol/L (ref 3.5–5.1)
Sodium: 135 mmol/L (ref 135–145)

## 2014-12-15 MED ORDER — PREDNISONE 20 MG PO TABS: 20.0000 mg | ORAL_TABLET | Freq: Every day | ORAL | Status: DC

## 2014-12-15 MED ORDER — MORPHINE SULFATE 2 MG/ML IJ SOLN
1.0000 mg | INTRAMUSCULAR | Status: DC | PRN
  Administered 2014-12-15 – 2014-12-17 (×2): 1 mg via INTRAVENOUS
  Filled 2014-12-15 (×2): qty 1

## 2014-12-15 MED ORDER — FUROSEMIDE 10 MG/ML IJ SOLN
40.0000 mg | Freq: Three times a day (TID) | INTRAMUSCULAR | Status: DC
  Administered 2014-12-15 – 2014-12-17 (×5): 40 mg via INTRAVENOUS
  Filled 2014-12-15 (×8): qty 4

## 2014-12-15 NOTE — Progress Notes (Addendum)
On the shift of December 13, 2014 1900- March 21,2016-0700, [name] called this writer's phone stating, "lungs feel like they are on fire". Pt was sitting in chair when writer walked in room, having labored breathing, diminished lungs when auscultated. Pt requested to have Symbicort. Encouraged patient to also take neb treatment, however after she was given Symbicort, pt stated she would be fine not to take at  the scheduled time. Respiratory therapist assessed patient not to be in distress after giving Symbicort.  During routine vitals, patient's oxygen saturation was between 70 to 80 percent on 4L of oxygen with patient sitting in chair.  Oxygen was increased and respiratory was called for breathing treatment. She was placed on a venturi mask at 50%. During which, Pt became agitated and fearful removing her IV that was saline locked from her lt forearm. She was given her scheduled dose of Alprazolam 1 mg at 2250. Several attempts were made to gain IV access. Upon returning to patient room after gathering supplies, pt was found dyspneic since having ambulated to bedside commode. Pt oxygen saturation was assessed in the low 80's. CPAP was placed on and T.Rogue Bussing, NP was called about patient status. No orders were received.  This writer placed patient on continuous pulse oximeter for monitoring. She c/o continued discomfort  in her rt side of back and a headache. She was administered Naproxen and Zanaflex 2 mg at 2330.  Request for Oxycodone IR 10mg  was denied upon request since recent administration of NSAID and muscle relaxer. She did finally receive requested  pain medication when she got out of bed to use bedside commode  at 0339. Prior to shift change, pt was easy to arouse and alert,  While bedside report was given at foot of patient bed.

## 2014-12-15 NOTE — Progress Notes (Signed)
PULMONARY / CRITICAL CARE MEDICINE   Name: [name] MRN: [id number] DOB: 11-Jul-1966    ADMISSION DATE:  12/09/2014 CONSULTATION DATE:  3/21  REFERRING MD :  [facility]  CHIEF COMPLAINT:  SOB  INITIAL PRESENTATION: sob  STUDIES:    SIGNIFICANT EVENTS: 3/21 desaturated after oversedation 3/22 negative 7 litre. Awake and alert  HISTORY OF PRESENT ILLNESS:   [name] is a MO(303 lb) with known OSA and is Cpap dependent and compliant who was admitted 3/16 with SOB/Htn and anxiety. CAP was suspected but not supported by CxR or culture data  but was on steroids and Cefepime. Early am 3/21 was noted to be desaturating and lethargic. Review of medications reveals: Xanax 1mg  at 3/30 2233, Ativan 0.5 mg IV at 3/20 2230, Oxy IR at 3/21 at 0335. Transferred to ICU 3/21 at 0910 for AMS and desaturations after being taken off her Cpap.PCCM asked to evaluate in the ICU for questionable pulmonary embolism. Currently 3/21 10:15 on [place] and sats 94%, awake and alert. Note PMH is significant for pulmonary sarcoid. She is followed by Dr. [name] for OSA/OHS COPD and chronic resp failure. Continues to smoke despite the severity of her lung disease.    SUBJECTIVE:  Better today after 7 l diuresis VITAL SIGNS: Temp:  [97.7 F (36.5 C)-99.2 F (37.3 C)] 98.4 F (36.9 C) (03/22 0400) Pulse Rate:  [27-106] 89 (03/22 0455) Resp:  [16-27] 16 (03/22 0455) BP: (90-135)/(50-83) 113/78 mmHg (03/22 0455) SpO2:  [86 %-100 %] 88 % (03/22 0455) Weight:  [299 lb 2.6 oz (135.7 kg)] 299 lb 2.6 oz (135.7 kg) (03/22 0400) HEMODYNAMICS:   VENTILATOR SETTINGS:   INTAKE / OUTPUT:  Intake/Output Summary (Last 24 hours) at 12/15/14 0837 Last data filed at 12/15/14 0400  Gross per 24 hour  Intake    720 ml  Output   8450 ml  Net  -7730 ml    PHYSICAL EXAMINATION: General:  MOAAF NAD, follows commands on [place] Neuro:  Intact, follows commands Head: [place]/AT Eyes: PERRL, EOM-I. Neck:  No  neck/JVD Cardiovascular:  HSR RRR Lungs:  Decreased air movement, no wheezes Abdomen:  Obese hernia noted Musculoskeletal:  intact  Skin: warm  LABS:  CBC  Recent Labs Lab 12/12/14 0448 12/13/14 0820 12/15/14 0345  WBC 9.2 8.5 11.2*  HGB 13.5 13.2 13.5  HCT 42.7 41.9 41.7  PLT 171 134* 76*   Coag's No results for input(s): APTT, INR in the last 168 hours. BMET  Recent Labs Lab 12/13/14 0820 12/14/14 0430 12/15/14 0345  NA 135 133* 135  K 3.8 3.0* 3.6  CL 90* 86* 93*  CO2 36* 35* 33*  BUN 23 26* 29*  CREATININE 1.00 1.11* 1.18*  GLUCOSE 158* 145* 231*   Electrolytes  Recent Labs Lab 12/13/14 0820 12/14/14 0430 12/15/14 0345  CALCIUM 9.3 8.6 8.3*   Sepsis Markers  Recent Labs Lab 12/09/14 0840 12/10/14 0848 12/14/14 0921  LATICACIDVEN 2.51* 1.6 1.2   ABG  Recent Labs Lab 12/09/14 1140 12/14/14 0901  PHART 7.420 7.431  PCO2ART 47.4* 57.4*  PO2ART 70.0* 71.2*   Liver Enzymes No results for input(s): AST, ALT, ALKPHOS, BILITOT, ALBUMIN in the last 168 hours. Cardiac Enzymes  Recent Labs Lab 12/14/14 0921  TROPONINI 0.05*   Glucose  Recent Labs Lab 12/13/14 1241 12/13/14 1752 12/13/14 2120 12/14/14 1222 12/14/14 1715 12/14/14 2139  GLUCAP 234* 335* 267* 144* 323* 361*   Imaging Dg Chest Port 1 View  12/14/2014   CLINICAL DATA:  Shortness of breath and chest pain  EXAM: PORTABLE CHEST - 1 VIEW  COMPARISON:  12/13/2014  FINDINGS: Cardiac shadow remains enlarged. Patchy infiltrate is noted in the right lung base. Some diffuse interstitial changes are noted bilaterally which may be related to superimposed congestive failure. This is more prominent than that seen on the prior exam.  IMPRESSION: Stable patchy right basilar infiltrate.  Increasing vascular congestion and interstitial edema.   Electronically Signed   By: [name] M.D.   On: 12/14/2014 10:57    Intake/Output Summary (Last 24 hours) at 12/15/14 0841 Last data filed at  12/15/14 0400  Gross per 24 hour  Intake    720 ml  Output   8450 ml  Net  -7730 ml   ASSESSMENT / PLAN:  PULMONARY OETT A: OSA OHS COPD Pulmonary sarcoid Tobacco abuse Resp distress magnified by oversedation P:   Cpap prn and nocturnal Decrease sedatives and analgesics BD's as needed O2 as needed No CT chest is needed at this time Gentle diuresis (neg 7 litres in 24 hours  CARDIOVASCULAR CVL A:  HTN Grade 1 diastolic dysfunction Volume overlaod P:  Antihypertensives Diuresis  RENAL Lab Results  Component Value Date   CREATININE 1.18* 12/15/2014   CREATININE 1.11* 12/14/2014   CREATININE 1.00 12/13/2014   A:   No acute issue P:   Careful diuresis. Replace electrolytes as indicated. BMET in AM  GASTROINTESTINAL A:   MO P:   Wt loss  HEMATOLOGIC A:   Sarcoid P:  Continue current management  INFECTIOUS A:   Given vanc and Zoysn but dc'd P:   D/C all abx. Monitor.  ENDOCRINE CBG (last 3)   Recent Labs  12/14/14 1222 12/14/14 1715 12/14/14 2139  GLUCAP 144* 323* 361*   A:   DM poorly controlled P:   SSI Wean steroids quickly, currently on prednisone 40 mg   NEUROLOGIC A:   Obtruded  P:   RASS goal: 1 Awake and alert Minimize  anxiolytics and narcotics.  FAMILY  - Updates: No family bedside  - Inter-disciplinary family meet or Palliative Care meeting due by:  day 7  TODAY'S SUMMARY:  No acute distress 3/22. Sating 100% on cpap. Afebrile VSS. PCCM will sign off.  Richardson Landry Minor ACNP Maryanna Shape PCCM Pager [phone] till 3 pm If no answer page [phone]  Respiratory status much improved, on CPAP only while asleep at this point, awake and interactive, minimize sedation, gentle diureses, may transfer to tele at this point.  PCCM will sign off, please call back if needed.  Patient seen and examined, agree with above note.  I dictated the care and orders written for this patient under my direction.  Rush Farmer,  MD [phone]

## 2014-12-15 NOTE — Plan of Care (Signed)
Problem: Phase II Progression Outcomes Goal: Progress activity as tolerated unless otherwise ordered Outcome: Not Progressing O2 sats drop below 90% with minimal movement in bed.  Problem: Phase III Progression Outcomes Goal: Pain controlled on oral analgesia Outcome: Progressing Patient has chronic pain.

## 2014-12-15 NOTE — Progress Notes (Signed)
Dear Doctor: This patient has been identified as a candidate for PICC for the following reason (s): poor veins/poor circulatory system (CHF, COPD, emphysema, diabetes, steroid use, IV drug abuse, etc.) If you agree, please write an order for the indicated device. For any questions contact the Vascular Access Team at [phone] if no answer, please leave a message. Catalina [employer]   Thank you for supporting the early vascular access assessment program.

## 2014-12-15 NOTE — Progress Notes (Addendum)
[facility] PROGRESS NOTE  [name] PPI:[id number] DOB: [birth year] DOA: 12/09/2014 PCP: Gwendolyn Grant, MD  Interim summary Patient is a pleasant 49 year old female with a past medical history morbid obesity, obesity hypoventilation syndrome, chronic objective pulmonary disease, chronic diastolic congestive heart failure,IDDM admitted to the medicine service on 12/09/2014 when she presented with complaints of increasing shortness of breath. She was initially worked up with a chest x-ray which showed vascular congestion, bilateral central airspace opacities concerning for mild pulmonary edema, although pneumonia could also present with similar changes. She was started on empiric antibiotic therapy with Levaquin. On 12/14/2014 patient lethargic, hypoxemic, on noninvasive positive pressure ventilation, ABG showing a PCO2 of 57.4, increased from 47.4. Pulmonary critical care medicine consulted, transferred to step down unit for closer monitoring. Suspected to be secondary to oversedation from benzodiazepine and narcotic therapy received overnight along with CHF. She was given 40 mg of IV Lasix 1. Patient showing clinical improvement over the course of the morning in the step down unit.   HPI/Subjective: Alert- no c/o cough, c/o dyspnea on exertion- I had her get out of bed and with just standing at he bedside, she became quite short of breath and hypoxic   Assessment/Plan:  Acute on chronic hypoxemic hypercarbic respiratory failure - admitted with dyspnea and hypoxia- when to SDU on 3/20 for hypercarbic resp failure -Pulmonary critical care medicine consulted, did not recommend CT or antimicrobial therapy, as respiratory failure likely due to oversedation from benzodiazepine and narcotic analgesics administered overnight along with CHF.  - now alert again- still quite hypoxic trying to stand- pulse ox drops to 83%- check CT chest today  -flu swab neg  (A) Possible community acquired  pneumonia. -Patient presenting with complaints of cough associated with shortness of breath. Initial chest x-ray showed bilateral airspace opacities -Blood cultures obtained on 12/09/2014 showed no growth 2 sets -Patient received 5 days of Levaquin completed on 3/21 - Pulmonary critical care medicine did not recommend further antibiotic therapy at this time  (B)  Possible superimposed COPD exacerbation -Patient reports ongoing shortness of breath, having diminished breath sounds on lung exam -Wean prednisone from 40 to 20 mg today  (C)  Acute on Chronic diastolic congestive heart failure. -Last transthoracic echocardiogram performed on 11/21/2013 that showed an EF of 06-30%, grade 1 diastolic dysfunction -She was given dose of Lasix 40mg  IV x 1 3/21- -Continue Torsemide 100 mg PO BID -Having good diuresis  (D)OSA/ OHS - cont Cpap    Poorly controlled insulin dependent diabetes mellitus -Patient reporting taking Lantus 30 units subcutaneous changes twice a day at home but tells me she was NOT TAKING HER NOVOLOG. -During this hospitalization she was started on steroids with her blood sugars have been mostly in the 200 range -On Lantus to 30 units subcutaneous changes twice a day, continue monitoring blood sugars, mealtime coverage with 14 units of NovoLog, per diabetic coordinator recommendations  Hypokalemia - replaced  Acute thrombocytopenia - dropped from 145 to 76- rechecked Plt count- - 78 - stop Lovenox place SCDs- check HIT  Code Status: Full code Family Communication:  Disposition Plan: Anticipate discharge home when medically stable   Antibiotics:  Levaquin DC'd on 12/14/2014    Objective: Filed Vitals:   12/15/14 1200  BP:   Pulse:   Temp: 97.4 F (36.3 C)  Resp:     Intake/Output Summary (Last 24 hours) at 12/15/14 1328 Last data filed at 12/15/14 1100  Gross per 24 hour  Intake   1260 ml  Output   7800 ml  Net  -6540 ml   Filed Weights   12/09/14  1541 12/13/14 1243 12/15/14 0400  Weight: 134.265 kg (296 lb) 137.6 kg (303 lb 5.7 oz) 135.7 kg (299 lb 2.6 oz)    Exam:   General:  Patient alert, no distress  Cardiovascular: Regular rate and rhythm normal S1-S2 no murmurs rubs or gallops  Respiratory: Diminished breath sounds bilaterally - no wheezing   Abdomen: Obese, soft nontender nondistended positive bowel sounds  Musculoskeletal: no c/c/e  Data Reviewed: Basic Metabolic Panel:  Recent Labs Lab 12/11/14 0430 12/12/14 0448 12/12/14 1812 12/13/14 0820 12/14/14 0430 12/15/14 0345  NA 133* 138  --  135 133* 135  K 5.3* 4.0  --  3.8 3.0* 3.6  CL 98 95*  --  90* 86* 93*  CO2 26 30  --  36* 35* 33*  GLUCOSE 306* 294* 414* 158* 145* 231*  BUN 15 22  --  23 26* 29*  CREATININE 0.92 1.05  --  1.00 1.11* 1.18*  CALCIUM 9.5 10.1  --  9.3 8.6 8.3*   Liver Function Tests: No results for input(s): AST, ALT, ALKPHOS, BILITOT, PROT, ALBUMIN in the last 168 hours. No results for input(s): LIPASE, AMYLASE in the last 168 hours. No results for input(s): AMMONIA in the last 168 hours. CBC:  Recent Labs Lab 12/09/14 0330 12/11/14 0430 12/12/14 0448 12/13/14 0820 12/15/14 0345  WBC 6.7 6.7 9.2 8.5 11.2*  HGB 14.2 12.1 13.5 13.2 13.5  HCT 44.2 38.3 42.7 41.9 41.7  MCV 81.1 82.0 81.8 82.3 81.1  PLT 168 171 171 134* 76*   Cardiac Enzymes:  Recent Labs Lab 12/14/14 0921  TROPONINI 0.05*   BNP (last 3 results)  Recent Labs  10/18/14 1355 12/09/14 0330 12/13/14 0820  BNP 11.1 31.0 29.7    ProBNP (last 3 results)  Recent Labs  02/11/14 0021 04/17/14 0120 07/20/14 1817  PROBNP 32.8 44.7 39.7    CBG:  Recent Labs Lab 12/14/14 1222 12/14/14 1715 12/14/14 2139 12/15/14 0734 12/15/14 1138  GLUCAP 144* 323* 361* 205* 186*    Recent Results (from the past 240 hour(s))  Culture, blood (routine x 2)     Status: None   Collection Time: 12/09/14  9:45 AM  Result Value Ref Range Status   Specimen  Description BLOOD LEFT ARM  Final   Special Requests BOTTLES DRAWN AEROBIC AND ANAEROBIC 8CC  Final   Culture   Final    NO GROWTH 5 DAYS Performed at [facility]    Report Status 12/15/2014 FINAL  Final  Culture, blood (routine x 2)     Status: None   Collection Time: 12/09/14  9:50 AM  Result Value Ref Range Status   Specimen Description BLOOD LEFT ARM  Final   Special Requests BOTTLES DRAWN AEROBIC ONLY 4CC  Final   Culture   Final    NO GROWTH 5 DAYS Performed at [facility]    Report Status 12/15/2014 FINAL  Final  MRSA PCR Screening     Status: Abnormal   Collection Time: 12/14/14  9:30 AM  Result Value Ref Range Status   MRSA by PCR POSITIVE (A) NEGATIVE Final    Comment:        The GeneXpert MRSA Assay (FDA approved for NASAL specimens only), is one component of a comprehensive MRSA colonization surveillance program. It is not intended to diagnose MRSA infection nor to guide or monitor treatment for MRSA infections.  RESULT CALLED TO, READ BACK BY AND VERIFIED WITH: MAIN,S @ 1215 ON [id number] BY POTEAT,S      Studies: Dg Chest Port 1 View  12/14/2014   CLINICAL DATA:  Shortness of breath and chest pain  EXAM: PORTABLE CHEST - 1 VIEW  COMPARISON:  12/13/2014  FINDINGS: Cardiac shadow remains enlarged. Patchy infiltrate is noted in the right lung base. Some diffuse interstitial changes are noted bilaterally which may be related to superimposed congestive failure. This is more prominent than that seen on the prior exam.  IMPRESSION: Stable patchy right basilar infiltrate.  Increasing vascular congestion and interstitial edema.   Electronically Signed   By: [name] M.D.   On: 12/14/2014 10:57    Scheduled Meds: . ALPRAZolam  1 mg Oral TID  . antiseptic oral rinse  7 mL Mouth Rinse q12n4p  . aspirin EC  81 mg Oral Daily  . budesonide-formoterol  2 puff Inhalation BID  . chlorhexidine  15 mL Mouth Rinse BID  . Chlorhexidine Gluconate Cloth  6 each  Topical Q0600  . enoxaparin (LOVENOX) injection  65 mg Subcutaneous Q24H  . fluticasone  2 spray Each Nare Daily  . furosemide  40 mg Intravenous Once  . gabapentin  300 mg Oral QHS  . glimepiride  1 mg Oral Q breakfast  . guaiFENesin  1,200 mg Oral BID  . insulin aspart  0-20 Units Subcutaneous TID WC  . insulin aspart  0-5 Units Subcutaneous QHS  . insulin aspart  14 Units Subcutaneous TID WC  . insulin glargine  30 Units Subcutaneous BID  . mupirocin ointment  1 application Nasal BID  . pantoprazole  40 mg Oral Daily  . predniSONE  40 mg Oral Q breakfast  . tiotropium  18 mcg Inhalation Daily  . torsemide  100 mg Oral BID  . varenicline  0.5 mg Oral BID WC  . venlafaxine XR  37.5 mg Oral Q breakfast   Continuous Infusions:      Time spent: 35 minutes    [place]  [facility] Pager:  [email or web address], password [facility] 12/15/2014, 1:28 PM  LOS: 6 days

## 2014-12-15 NOTE — Progress Notes (Signed)
#  20 gauge IV placed in patients left upper arm in hopes that patient would be able to undergo CT/angio. Radiology tech came to assess the IV and didn't feel that it would provide the best results for the scan.

## 2014-12-16 ENCOUNTER — Other Ambulatory Visit: Payer: Self-pay | Admitting: Pulmonary Disease

## 2014-12-16 ENCOUNTER — Inpatient Hospital Stay (HOSPITAL_COMMUNITY): Payer: Medicare Other

## 2014-12-16 DIAGNOSIS — I5033 Acute on chronic diastolic (congestive) heart failure: Secondary | ICD-10-CM

## 2014-12-16 DIAGNOSIS — D696 Thrombocytopenia, unspecified: Secondary | ICD-10-CM

## 2014-12-16 LAB — BASIC METABOLIC PANEL
Anion gap: 12 (ref 5–15)
BUN: 29 mg/dL — AB (ref 6–23)
CALCIUM: 9.6 mg/dL (ref 8.4–10.5)
CHLORIDE: 93 mmol/L — AB (ref 96–112)
CO2: 31 mmol/L (ref 19–32)
Creatinine, Ser: 1.09 mg/dL (ref 0.50–1.10)
GFR calc Af Amer: 69 mL/min — ABNORMAL LOW (ref >90)
GFR calc non Af Amer: 59 mL/min — ABNORMAL LOW (ref >90)
GLUCOSE: 207 mg/dL — AB (ref 70–99)
Potassium: 3.3 mmol/L — ABNORMAL LOW (ref 3.5–5.1)
Sodium: 136 mmol/L (ref 135–145)

## 2014-12-16 LAB — CBC
HEMATOCRIT: 46.4 % — AB (ref 36.0–46.0)
HEMOGLOBIN: 15.1 g/dL — AB (ref 12.0–15.0)
MCH: 26.6 pg (ref 26.0–34.0)
MCHC: 32.5 g/dL (ref 30.0–36.0)
MCV: 81.7 fL (ref 78.0–100.0)
Platelets: 91 10*3/uL — ABNORMAL LOW (ref 150–400)
RBC: 5.68 MIL/uL — ABNORMAL HIGH (ref 3.87–5.11)
RDW: 19.8 % — AB (ref 11.5–15.5)
WBC: 9.6 10*3/uL (ref 4.0–10.5)

## 2014-12-16 LAB — GLUCOSE, CAPILLARY
GLUCOSE-CAPILLARY: 122 mg/dL — AB (ref 70–99)
Glucose-Capillary: 158 mg/dL — ABNORMAL HIGH (ref 70–99)
Glucose-Capillary: 205 mg/dL — ABNORMAL HIGH (ref 70–99)
Glucose-Capillary: 99 mg/dL (ref 70–99)
Glucose-Capillary: 243 mg/dL — ABNORMAL HIGH (ref 70–99)

## 2014-12-16 MED ORDER — DICLOFENAC SODIUM 1 % TD GEL
4.0000 g | Freq: Four times a day (QID) | TRANSDERMAL | Status: DC
  Administered 2014-12-16 – 2014-12-18 (×8): 4 g via TOPICAL
  Filled 2014-12-16: qty 100

## 2014-12-16 MED ORDER — CHLORHEXIDINE GLUCONATE CLOTH 2 % EX PADS
6.0000 | MEDICATED_PAD | Freq: Every day | CUTANEOUS | Status: DC
  Administered 2014-12-16 – 2014-12-18 (×3): 6 via TOPICAL

## 2014-12-16 NOTE — Progress Notes (Signed)
Pt complaining of severe pain in bilateral knees. Refused Naproxen and it is not time for PRN Oxycodone yet. Pt asked RN to call MD regarding stronger pain medication. MD text paged and called back instantly. New orders of transdermal gel. Ice/heat packs and the Naproxen offered to pt again. Pt accepted heat packs.

## 2014-12-16 NOTE — Progress Notes (Signed)
[facility] PROGRESS NOTE  HOLY BATTENFIELD JME:[id number] DOB: 21-Jun-1966 DOA: 12/09/2014 PCP: Gwendolyn Grant, MD  Interim summary Patient is a pleasant 49 year old female with a past medical history morbid obesity, obesity hypoventilation syndrome, chronic objective pulmonary disease, chronic diastolic congestive heart failure,IDDM admitted to the medicine service on 12/09/2014 when she presented with complaints of increasing shortness of breath. She was initially worked up with a chest x-ray which showed vascular congestion, bilateral central airspace opacities concerning for mild pulmonary edema, although pneumonia could also present with similar changes. She was started on empiric antibiotic therapy with Levaquin. On 12/14/2014 patient lethargic, hypoxemic, on noninvasive positive pressure ventilation, ABG showing a PCO2 of 57.4, increased from 47.4. Pulmonary critical care medicine consulted, transferred to step down unit for closer monitoring. Suspected to be secondary to oversedation from benzodiazepine and narcotic therapy received overnight along with CHF. She was given 40 mg of IV Lasix 1. Patient showing clinical improvement over the course of the morning in the step down unit.   HPI/Subjective: She states that pulmonary complaints unchanged. Has a chronic unchanged cough. We were able to assist her out of bed to standing again today- she is much less short of breath- pulse ox dropping to 88% where it was dropping to 83% yesterday.   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  - now alert again-hypoxia improving with diuresis- in neg balance  by almost 4 L yesterday - encouraged to get OOB to chair - will start IS- unable to obtain CT to r/o PE as she did not have the right type of IV- once CXR shows improvement in infiltrate, will do a VQ  (A) Possible community acquired pneumonia. -Patient presenting with complaints of cough associated with shortness of breath. Initial chest x-ray showed bilateral airspace opacities -Blood cultures obtained on 12/09/2014 showed no growth 2 sets -Patient received 5 days of Levaquin completed on 3/21 - she tells me that she has a chronic unchanged cough- likely has chronic bronchitis - Pulmonary critical care medicine did not recommend further antibiotic therapy at this time  (B)  Possible superimposed COPD exacerbation - d/c Prednisone- last dose 3/22  (C)  Acute on Chronic diastolic congestive heart failure. -Last transthoracic echocardiogram performed on 11/21/2013 that showed an EF of 22-97%, grade 1 diastolic dysfunction  - aggressive diuresis started again yesterday- she told me that her Aldactone had been held when last discharged- this appears to have been due to renal failure- see above regarding diuresis- pulse ox appears to be improving- Cr has actually improved as well-    (D)OSA/ OHS - cont Cpap    Poorly controlled insulin dependent diabetes mellitus -Patient reporting taking Lantus 30 units subcutaneous changes twice a day at home but tells me she was NOT [place]. -During this hospitalization she was started on steroids with her blood sugars have been mostly in the 200 range -On Lantus to 30 units subcutaneous changes twice a day, continue monitoring blood sugars, mealtime coverage with 14 units of NovoLog, per diabetic coordinator recommendations  Hypokalemia - replaced  Acute thrombocytopenia - dropped from 145 to 76- rechecked Plt count- - 78 - stopped Lovenox 3/22- placed SCDs- checking  HIT  Code Status: Full code Family  Communication:  Disposition Plan:  Anticipate discharge home when medically stable- transfer to med surg today   Antibiotics:  Levaquin DC'd on 12/14/2014    Objective: Filed Vitals:   12/16/14 0800  BP:   Pulse:   Temp: 97 F (36.1 C)  Resp:     Intake/Output Summary (Last 24 hours) at 12/16/14 1007 Last data filed at 12/16/14 0800  Gross per 24 hour  Intake    800 ml  Output   5575 ml  Net  -4775 ml   Filed Weights   12/13/14 1243 12/15/14 0400 12/16/14 0330  Weight: 137.6 kg (303 lb 5.7 oz) 135.7 kg (299 lb 2.6 oz) 136.5 kg (300 lb 14.9 oz)    Exam:   General:  Patient alert, no distress  Cardiovascular: Regular rate and rhythm normal S1-S2 no murmurs rubs or gallops  Respiratory: Diminished breath sounds bilaterally - no wheezing - 97 %on 4 L at rest- 88% with mild exertion  Abdomen: Obese, soft nontender nondistended positive bowel sounds  Musculoskeletal: no c/c/e  Data Reviewed: Basic Metabolic Panel:  Recent Labs Lab 12/12/14 0448 12/12/14 1812 12/13/14 0820 12/14/14 0430 12/15/14 0345 12/16/14 0820  NA 138  --  135 133* 135 136  K 4.0  --  3.8 3.0* 3.6 3.3*  CL 95*  --  90* 86* 93* 93*  CO2 30  --  36* 35* 33* 31  GLUCOSE 294* 414* 158* 145* 231* 207*  BUN 22  --  23 26* 29* 29*  CREATININE 1.05  --  1.00 1.11* 1.18* 1.09  CALCIUM 10.1  --  9.3 8.6 8.3* 9.6   Liver Function Tests: No results for input(s): AST, ALT, ALKPHOS, BILITOT, PROT, ALBUMIN in the last 168 hours. No results for input(s): LIPASE, AMYLASE in the last 168 hours. No results for input(s): AMMONIA in the last 168 hours. CBC:  Recent Labs Lab 12/11/14 0430 12/12/14 0448 12/13/14 0820 12/15/14 0345 12/15/14 1303  WBC 6.7 9.2 8.5 11.2* 11.2*  HGB 12.1 13.5 13.2 13.5 13.8  HCT 38.3 42.7 41.9 41.7 42.1  MCV 82.0 81.8 82.3 81.1 82.1  PLT 171 171 134* 76* 78*   Cardiac Enzymes:  Recent Labs Lab 12/14/14 0921  TROPONINI 0.05*   BNP (last 3 results)  Recent Labs  10/18/14 1355 12/09/14 0330  12/13/14 0820  BNP 11.1 31.0 29.7    ProBNP (last 3 results)  Recent Labs  02/11/14 0021 04/17/14 0120 07/20/14 1817  PROBNP 32.8 44.7 39.7    CBG:  Recent Labs Lab 12/15/14 0734 12/15/14 1138 12/15/14 1733 12/15/14 2158 12/16/14 0744  GLUCAP 205* 186* 237* 245* 158*    Recent Results (from the past 240 hour(s))  Culture, blood (routine x 2)     Status: None   Collection Time: 12/09/14  9:45 AM  Result Value Ref Range Status   Specimen Description BLOOD LEFT ARM  Final   Special Requests BOTTLES DRAWN AEROBIC AND ANAEROBIC 8CC  Final   Culture   Final    NO GROWTH 5 DAYS Performed at [facility]    Report Status 12/15/2014 FINAL  Final  Culture, blood (routine x 2)     Status: None   Collection Time: 12/09/14  9:50 AM  Result Value Ref Range Status   Specimen Description BLOOD LEFT ARM  Final   Special Requests BOTTLES DRAWN AEROBIC ONLY 4CC  Final   Culture   Final    NO GROWTH 5 DAYS Performed at [facility]  Report Status 12/15/2014 FINAL  Final  MRSA PCR Screening     Status: Abnormal   Collection Time: 12/14/14  9:30 AM  Result Value Ref Range Status   MRSA by PCR POSITIVE (A) NEGATIVE Final    Comment:        The GeneXpert MRSA Assay (FDA approved for NASAL specimens only), is one component of a comprehensive MRSA colonization surveillance program. It is not intended to diagnose MRSA infection nor to guide or monitor treatment for MRSA infections. RESULT CALLED TO, READ BACK BY AND VERIFIED WITH: MAIN,S @ 1215 ON [id number] BY POTEAT,S      Studies: Dg Chest Port 1 View  12/14/2014   CLINICAL DATA:  Shortness of breath and chest pain  EXAM: PORTABLE CHEST - 1 VIEW  COMPARISON:  12/13/2014  FINDINGS: Cardiac shadow remains enlarged. Patchy infiltrate is noted in the right lung base. Some diffuse interstitial changes are noted bilaterally which may be related to superimposed congestive failure. This is more prominent than that  seen on the prior exam.  IMPRESSION: Stable patchy right basilar infiltrate.  Increasing vascular congestion and interstitial edema.   Electronically Signed   By: [name] M.D.   On: 12/14/2014 10:57    Scheduled Meds: . ALPRAZolam  1 mg Oral TID  . antiseptic oral rinse  7 mL Mouth Rinse q12n4p  . aspirin EC  81 mg Oral Daily  . budesonide-formoterol  2 puff Inhalation BID  . chlorhexidine  15 mL Mouth Rinse BID  . Chlorhexidine Gluconate Cloth  6 each Topical Q0600  . fluticasone  2 spray Each Nare Daily  . furosemide  40 mg Intravenous Once  . furosemide  40 mg Intravenous Q8H  . gabapentin  300 mg Oral QHS  . glimepiride  1 mg Oral Q breakfast  . guaiFENesin  1,200 mg Oral BID  . insulin aspart  0-20 Units Subcutaneous TID WC  . insulin aspart  0-5 Units Subcutaneous QHS  . insulin aspart  14 Units Subcutaneous TID WC  . insulin glargine  30 Units Subcutaneous BID  . mupirocin ointment  1 application Nasal BID  . pantoprazole  40 mg Oral Daily  . tiotropium  18 mcg Inhalation Daily  . varenicline  0.5 mg Oral BID WC  . venlafaxine XR  37.5 mg Oral Q breakfast   Continuous Infusions:      Time spent: 35 minutes    [place]  [facility] Pager:  [email or web address], password [facility] 12/16/2014, 10:07 AM  LOS: 7 days

## 2014-12-17 ENCOUNTER — Inpatient Hospital Stay (HOSPITAL_COMMUNITY): Payer: Medicare Other

## 2014-12-17 DIAGNOSIS — I5031 Acute diastolic (congestive) heart failure: Secondary | ICD-10-CM

## 2014-12-17 LAB — BASIC METABOLIC PANEL
ANION GAP: 13 (ref 5–15)
BUN: 23 mg/dL (ref 6–23)
CALCIUM: 9 mg/dL (ref 8.4–10.5)
CHLORIDE: 93 mmol/L — AB (ref 96–112)
CO2: 31 mmol/L (ref 19–32)
Creatinine, Ser: 0.8 mg/dL (ref 0.50–1.10)
GFR calc non Af Amer: 86 mL/min — ABNORMAL LOW (ref >90)
Glucose, Bld: 167 mg/dL — ABNORMAL HIGH (ref 70–99)
Potassium: 2.9 mmol/L — ABNORMAL LOW (ref 3.5–5.1)
Sodium: 137 mmol/L (ref 135–145)

## 2014-12-17 LAB — CBC
HCT: 43.9 % (ref 36.0–46.0)
Hemoglobin: 14.3 g/dL (ref 12.0–15.0)
MCH: 26.6 pg (ref 26.0–34.0)
MCHC: 32.6 g/dL (ref 30.0–36.0)
MCV: 81.6 fL (ref 78.0–100.0)
Platelets: 106 10*3/uL — ABNORMAL LOW (ref 150–400)
RBC: 5.38 MIL/uL — ABNORMAL HIGH (ref 3.87–5.11)
RDW: 19.5 % — ABNORMAL HIGH (ref 11.5–15.5)
WBC: 9.2 10*3/uL (ref 4.0–10.5)

## 2014-12-17 LAB — GLUCOSE, CAPILLARY
GLUCOSE-CAPILLARY: 87 mg/dL (ref 70–99)
GLUCOSE-CAPILLARY: 157 mg/dL — AB (ref 70–99)
Glucose-Capillary: 194 mg/dL — ABNORMAL HIGH (ref 70–99)

## 2014-12-17 LAB — HEPARIN INDUCED THROMBOCYTOPENIA PNL: HEPARIN INDUCED PLT AB: 0.254 {OD_unit} (ref 0.000–0.400)

## 2014-12-17 MED ORDER — ALBUTEROL SULFATE (2.5 MG/3ML) 0.083% IN NEBU
2.5000 mg | INHALATION_SOLUTION | RESPIRATORY_TRACT | Status: DC | PRN
  Administered 2014-12-17: 2.5 mg via RESPIRATORY_TRACT
  Filled 2014-12-17: qty 3

## 2014-12-17 MED ORDER — POTASSIUM CHLORIDE CRYS ER 20 MEQ PO TBCR
40.0000 meq | EXTENDED_RELEASE_TABLET | ORAL | Status: AC
  Administered 2014-12-17 (×2): 40 meq via ORAL
  Filled 2014-12-17 (×2): qty 2

## 2014-12-17 MED ORDER — TECHNETIUM TC 99M DIETHYLENETRIAME-PENTAACETIC ACID: 44.0000 | Freq: Once | INTRAVENOUS | Status: AC | PRN

## 2014-12-17 MED ORDER — TECHNETIUM TO 99M ALBUMIN AGGREGATED
5.4000 | Freq: Once | INTRAVENOUS | Status: AC | PRN
  Administered 2014-12-17: 5 via INTRAVENOUS

## 2014-12-17 MED ORDER — ALBUTEROL SULFATE (2.5 MG/3ML) 0.083% IN NEBU
2.5000 mg | INHALATION_SOLUTION | Freq: Three times a day (TID) | RESPIRATORY_TRACT | Status: DC
  Administered 2014-12-18 (×2): 2.5 mg via RESPIRATORY_TRACT
  Filled 2014-12-17 (×2): qty 3

## 2014-12-17 NOTE — Progress Notes (Signed)
[facility] PROGRESS NOTE  MAYBELL MISENHEIMER EXB:[id number] DOB: 13-Apr-1966 DOA: 12/09/2014 PCP: Gwendolyn Grant, MD  Interim summary Patient is a pleasant 49 year old female with a past medical history morbid obesity, obesity hypoventilation syndrome, chronic objective pulmonary disease, chronic diastolic congestive heart failure,IDDM admitted to the medicine service on 12/09/2014 when she presented with complaints of increasing shortness of breath. She was initially worked up with a chest x-ray which showed vascular congestion, bilateral central airspace opacities concerning for mild pulmonary edema, although pneumonia could also present with similar changes. She was started on empiric antibiotic therapy with Levaquin. On 12/14/2014 patient lethargic, hypoxemic, on noninvasive positive pressure ventilation, ABG showing a PCO2 of 57.4, increased from 47.4. Pulmonary critical care medicine consulted, transferred to step down unit for closer monitoring. Suspected to be secondary to oversedation from benzodiazepine and narcotic therapy received overnight along with CHF. She was given 40 mg of IV Lasix 1. Patient showing clinical improvement over the course of the morning in the step down unit.   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  - now alert again-hypoxia improving with diuresis- in neg balance by almost 4 L yesterday - encouraged to get OOB to chair - will start IS- unable to obtain CT to r/o PE as she did not have the right type of IV- once CXR shows  improvement in infiltrate - I have diuresed her as much as possible- will cut back on diuretics-  -as she is still becoming hypoxic on exertion, I have ordered a VQ scan- started IS  (A) Possible community acquired pneumonia. -Patient presenting with complaints of cough associated with shortness of breath. Initial chest x-ray showed bilateral airspace opacities -Blood cultures obtained on 12/09/2014 showed no growth 2 sets -Patient received 5 days of Levaquin completed on 3/21 - she tells me that she has a chronic unchanged cough- likely has chronic bronchitis - Pulmonary critical care medicine did not recommend further antibiotic therapy at this time  (B)  Possible superimposed COPD exacerbation - d/c Prednisone- last dose 3/22  (C)  Acute on Chronic diastolic congestive heart failure. -Last transthoracic echocardiogram performed on 11/21/2013 that showed an EF of 10-27%, grade 1 diastolic dysfunction  - aggressive diuresis done- she told me that her Aldactone had been held when last discharged- this appears to have been due to renal failure- see above regarding diuresis- pulse oxx, Cr and symptoms have improved - cut back on Lasix  (D)OSA/ OHS - cont Cpap    Poorly controlled insulin dependent diabetes mellitus -Patient reporting taking Lantus 30 units subcutaneous changes twice a day at home but tells me she was NOT TAKING HER NOVOLOG. -During this hospitalization she was started on steroids with her blood sugars have been mostly in the 200 range -On Lantus to 30 units subcutaneous changes twice a day, continue monitoring blood sugars, mealtime coverage with 14 units of NovoLog, per diabetic coordinator recommendations  Hypokalemia - replaced  Acute thrombocytopenia - dropped from 145 to 76- rechecked Plt count- - 78 - stopped Lovenox 3/22- placed SCDs- checking  HIT  Code Status: Full code Family Communication:  Disposition Plan: Anticipate discharge home  when medically  stable- transfer to med surg today   Antibiotics:  Levaquin DC'd on 12/14/2014    Objective: Filed Vitals:   12/17/14 0636  BP: 106/67  Pulse: 98  Temp: 97.4 F (36.3 C)  Resp: 20    Intake/Output Summary (Last 24 hours) at 12/17/14 1302 Last data filed at 12/17/14 0900  Gross per 24 hour  Intake    600 ml  Output      0 ml  Net    600 ml   Filed Weights   12/15/14 0400 12/16/14 0330 12/16/14 1538  Weight: 135.7 kg (299 lb 2.6 oz) 136.5 kg (300 lb 14.9 oz) 132.8 kg (292 lb 12.3 oz)    Exam:   General:  Patient alert, no distress  Cardiovascular: Regular rate and rhythm normal S1-S2 no murmurs rubs or gallops  Respiratory: Diminished breath sounds bilaterally - no wheezing - 97 %on 4 L at rest- 85% with mild exertion  Abdomen: Obese, soft nontender nondistended positive bowel sounds  Musculoskeletal: no c/c/e  Data Reviewed: Basic Metabolic Panel:  Recent Labs Lab 12/13/14 0820 12/14/14 0430 12/15/14 0345 12/16/14 0820 12/17/14 0550  NA 135 133* 135 136 137  K 3.8 3.0* 3.6 3.3* 2.9*  CL 90* 86* 93* 93* 93*  CO2 36* 35* 33* 31 31  GLUCOSE 158* 145* 231* 207* 167*  BUN 23 26* 29* 29* 23  CREATININE 1.00 1.11* 1.18* 1.09 0.80  CALCIUM 9.3 8.6 8.3* 9.6 9.0   Liver Function Tests: No results for input(s): AST, ALT, ALKPHOS, BILITOT, PROT, ALBUMIN in the last 168 hours. No results for input(s): LIPASE, AMYLASE in the last 168 hours. No results for input(s): AMMONIA in the last 168 hours. CBC:  Recent Labs Lab 12/13/14 0820 12/15/14 0345 12/15/14 1303 12/16/14 0820 12/17/14 0550  WBC 8.5 11.2* 11.2* 9.6 9.2  HGB 13.2 13.5 13.8 15.1* 14.3  HCT 41.9 41.7 42.1 46.4* 43.9  MCV 82.3 81.1 82.1 81.7 81.6  PLT 134* 76* 78* 91* 106*   Cardiac Enzymes:  Recent Labs Lab 12/14/14 0921  TROPONINI 0.05*   BNP (last 3 results)  Recent Labs  10/18/14 1355 12/09/14 0330 12/13/14 0820  BNP 11.1 31.0 29.7    ProBNP (last 3 results)  Recent  Labs  02/11/14 0021 04/17/14 0120 07/20/14 1817  PROBNP 32.8 44.7 39.7    CBG:  Recent Labs Lab 12/16/14 1153 12/16/14 1613 12/16/14 1626 12/16/14 2102 12/17/14 1233  GLUCAP 205* 122* 99 243* 87    Recent Results (from the past 240 hour(s))  Culture, blood (routine x 2)     Status: None   Collection Time: 12/09/14  9:45 AM  Result Value Ref Range Status   Specimen Description BLOOD LEFT ARM  Final   Special Requests BOTTLES DRAWN AEROBIC AND ANAEROBIC 8CC  Final   Culture   Final    NO GROWTH 5 DAYS Performed at [facility]    Report Status 12/15/2014 FINAL  Final  Culture, blood (routine x 2)     Status: None   Collection Time: 12/09/14  9:50 AM  Result Value Ref Range Status   Specimen Description BLOOD LEFT ARM  Final   Special Requests BOTTLES DRAWN AEROBIC ONLY 4CC  Final   Culture   Final    NO GROWTH 5 DAYS Performed at [facility]    Report Status 12/15/2014 FINAL  Final  MRSA PCR Screening     Status: Abnormal   Collection Time: 12/14/14  9:30  AM  Result Value Ref Range Status   MRSA by PCR POSITIVE (A) NEGATIVE Final    Comment:        The GeneXpert MRSA Assay (FDA approved for NASAL specimens only), is one component of a comprehensive MRSA colonization surveillance program. It is not intended to diagnose MRSA infection nor to guide or monitor treatment for MRSA infections. RESULT CALLED TO, READ BACK BY AND VERIFIED WITH: MAIN,S @ 1215 ON [id number] BY POTEAT,S      Studies: Dg Chest Port 1 View  12/16/2014   CLINICAL DATA:  Shortness of breath, respiratory failure, CHF and pulmonary infiltrates.  EXAM: PORTABLE CHEST - 1 VIEW  COMPARISON:  12/14/2014  FINDINGS: Bibasilar opacities, right greater than left are favored to largely represent chronic areas of scarring and atelectasis when reviewing prior chest x-rays. No progressive pulmonary airspace consolidation is identified. There is no evidence of overt pulmonary edema or  pleural fluid. No pneumothorax is identified. The heart size is stable and normal.  IMPRESSION: Remaining bibasilar opacities, right greater than left, are favored to represent chronic areas of scarring and atelectasis.   Electronically Signed   By: Aletta Edouard M.D.   On: 12/16/2014 11:38    Scheduled Meds: . ALPRAZolam  1 mg Oral TID  . antiseptic oral rinse  7 mL Mouth Rinse q12n4p  . aspirin EC  81 mg Oral Daily  . budesonide-formoterol  2 puff Inhalation BID  . chlorhexidine  15 mL Mouth Rinse BID  . Chlorhexidine Gluconate Cloth  6 each Topical Q0600  . diclofenac sodium  4 g Topical QID  . fluticasone  2 spray Each Nare Daily  . furosemide  40 mg Intravenous Once  . gabapentin  300 mg Oral QHS  . glimepiride  1 mg Oral Q breakfast  . guaiFENesin  1,200 mg Oral BID  . insulin aspart  0-20 Units Subcutaneous TID WC  . insulin aspart  0-5 Units Subcutaneous QHS  . insulin aspart  14 Units Subcutaneous TID WC  . insulin glargine  30 Units Subcutaneous BID  . mupirocin ointment  1 application Nasal BID  . pantoprazole  40 mg Oral Daily  . tiotropium  18 mcg Inhalation Daily  . varenicline  0.5 mg Oral BID WC  . venlafaxine XR  37.5 mg Oral Q breakfast   Continuous Infusions:      Time spent: 35 minutes    [place]  [facility] Pager:  [email or web address], password [facility] 12/17/2014, 1:02 PM  LOS: 8 days

## 2014-12-17 NOTE — Progress Notes (Signed)
Pt stated that she would self administer CPAP when ready for bed.  Current settings are 12 CMH20 via FFM with 6 LPM O2 bleed in.  RT filled H20 chamber and instructed Pt to call if any assistance is required.  RT to monitor and assess as needed.

## 2014-12-17 NOTE — Progress Notes (Signed)
SATURATION QUALIFICATIONS: (This note is used to comply with regulatory documentation for home oxygen)  Patient Saturations on Room Air at Rest = 88%  Patient Saturations on Room Air while Ambulating = 85%  Patient Saturations on 4L Liters of oxygen while Ambulating = 91%  Please briefly explain why patient needs home oxygen: Patient experiences periods of shortness of breath while on room air and ambulating.

## 2014-12-17 NOTE — Progress Notes (Signed)
CARE MANAGEMENT NOTE 12/17/2014  Patient:  [name], [name]   Account Number:  [id number]  Date Initiated:  12/17/2014  Documentation initiated by:  Edwyna Shell  Subjective/Objective Assessment:   49 yo female admitted with pna from home     Action/Plan:   discharge planning   Anticipated DC Date:  12/18/2014   Anticipated DC Plan:  [place]  CM consult      Choice offered to / List presented to:             Status of service:  Completed, signed off Medicare Important Message given?  YES (If response is "NO", the following Medicare IM given date fields will be blank) Date Medicare IM given:  12/17/2014 Medicare IM given by:  Edwyna Shell Date Additional Medicare IM given:   Additional Medicare IM given by:    Discharge Disposition:  HOME/SELF CARE  Per UR Regulation:    If discussed at Long Length of Stay Meetings, dates discussed:    Comments:  12/17/14 Edwyna Shell RN BSN CM [phone] Spoke with patient and she states that she lives alone and has a walker and home O2. Her PCP is Dr. Asa Lente and the patient is in the process of finding a new PCP. Provided patient with list of Medicare PCPs in her zip code for follow up. Patient stated that she has an appointment with Dr. Asa Lente in April. No CM needs or orders at this time.

## 2014-12-18 ENCOUNTER — Encounter (HOSPITAL_COMMUNITY): Payer: Self-pay

## 2014-12-18 ENCOUNTER — Inpatient Hospital Stay (HOSPITAL_COMMUNITY)
Admission: EM | Admit: 2014-12-18 | Discharge: 2014-12-28 | Disposition: A | Discharge status: Home / Self Care | Payer: Medicare Other | Source: Home / Self Care | Attending: Internal Medicine | Admitting: Internal Medicine

## 2014-12-18 ENCOUNTER — Inpatient Hospital Stay (HOSPITAL_COMMUNITY): Payer: Medicare Other

## 2014-12-18 ENCOUNTER — Emergency Department (HOSPITAL_COMMUNITY): Payer: Medicare Other

## 2014-12-18 DIAGNOSIS — K210 Gastro-esophageal reflux disease with esophagitis: Secondary | ICD-10-CM

## 2014-12-18 DIAGNOSIS — Z87891 Personal history of nicotine dependence: Secondary | ICD-10-CM | POA: Diagnosis not present

## 2014-12-18 DIAGNOSIS — R0602 Shortness of breath: Secondary | ICD-10-CM | POA: Diagnosis not present

## 2014-12-18 DIAGNOSIS — J9601 Acute respiratory failure with hypoxia: Secondary | ICD-10-CM

## 2014-12-18 DIAGNOSIS — I5032 Chronic diastolic (congestive) heart failure: Secondary | ICD-10-CM | POA: Diagnosis not present

## 2014-12-18 DIAGNOSIS — J441 Chronic obstructive pulmonary disease with (acute) exacerbation: Secondary | ICD-10-CM

## 2014-12-18 DIAGNOSIS — Z888 Allergy status to other drugs, medicaments and biological substances status: Secondary | ICD-10-CM | POA: Diagnosis not present

## 2014-12-18 DIAGNOSIS — J810 Acute pulmonary edema: Secondary | ICD-10-CM

## 2014-12-18 DIAGNOSIS — IMO0002 Reserved for concepts with insufficient information to code with codable children: Secondary | ICD-10-CM | POA: Diagnosis present

## 2014-12-18 DIAGNOSIS — J9620 Acute and chronic respiratory failure, unspecified whether with hypoxia or hypercapnia: Secondary | ICD-10-CM | POA: Diagnosis present

## 2014-12-18 DIAGNOSIS — K838 Other specified diseases of biliary tract: Secondary | ICD-10-CM

## 2014-12-18 DIAGNOSIS — F32A Depression, unspecified: Secondary | ICD-10-CM | POA: Diagnosis present

## 2014-12-18 DIAGNOSIS — Z7951 Long term (current) use of inhaled steroids: Secondary | ICD-10-CM | POA: Diagnosis not present

## 2014-12-18 DIAGNOSIS — N179 Acute kidney failure, unspecified: Secondary | ICD-10-CM | POA: Diagnosis not present

## 2014-12-18 DIAGNOSIS — D869 Sarcoidosis, unspecified: Secondary | ICD-10-CM | POA: Diagnosis not present

## 2014-12-18 DIAGNOSIS — E1165 Type 2 diabetes mellitus with hyperglycemia: Secondary | ICD-10-CM | POA: Diagnosis present

## 2014-12-18 DIAGNOSIS — D696 Thrombocytopenia, unspecified: Secondary | ICD-10-CM | POA: Diagnosis not present

## 2014-12-18 DIAGNOSIS — I5033 Acute on chronic diastolic (congestive) heart failure: Secondary | ICD-10-CM

## 2014-12-18 DIAGNOSIS — E119 Type 2 diabetes mellitus without complications: Secondary | ICD-10-CM | POA: Diagnosis not present

## 2014-12-18 DIAGNOSIS — I10 Essential (primary) hypertension: Secondary | ICD-10-CM | POA: Diagnosis not present

## 2014-12-18 DIAGNOSIS — I272 Other secondary pulmonary hypertension: Secondary | ICD-10-CM | POA: Diagnosis not present

## 2014-12-18 DIAGNOSIS — Z91040 Latex allergy status: Secondary | ICD-10-CM | POA: Diagnosis not present

## 2014-12-18 DIAGNOSIS — E785 Hyperlipidemia, unspecified: Secondary | ICD-10-CM | POA: Diagnosis not present

## 2014-12-18 DIAGNOSIS — M797 Fibromyalgia: Secondary | ICD-10-CM | POA: Diagnosis not present

## 2014-12-18 DIAGNOSIS — Z8249 Family history of ischemic heart disease and other diseases of the circulatory system: Secondary | ICD-10-CM | POA: Diagnosis not present

## 2014-12-18 DIAGNOSIS — J9610 Chronic respiratory failure, unspecified whether with hypoxia or hypercapnia: Secondary | ICD-10-CM | POA: Diagnosis present

## 2014-12-18 DIAGNOSIS — Z79899 Other long term (current) drug therapy: Secondary | ICD-10-CM | POA: Diagnosis not present

## 2014-12-18 DIAGNOSIS — F329 Major depressive disorder, single episode, unspecified: Secondary | ICD-10-CM | POA: Diagnosis present

## 2014-12-18 DIAGNOSIS — K219 Gastro-esophageal reflux disease without esophagitis: Secondary | ICD-10-CM | POA: Diagnosis not present

## 2014-12-18 DIAGNOSIS — J690 Pneumonitis due to inhalation of food and vomit: Secondary | ICD-10-CM | POA: Diagnosis not present

## 2014-12-18 DIAGNOSIS — Z882 Allergy status to sulfonamides status: Secondary | ICD-10-CM | POA: Diagnosis not present

## 2014-12-18 DIAGNOSIS — Z791 Long term (current) use of non-steroidal anti-inflammatories (NSAID): Secondary | ICD-10-CM | POA: Diagnosis not present

## 2014-12-18 DIAGNOSIS — J9690 Respiratory failure, unspecified, unspecified whether with hypoxia or hypercapnia: Secondary | ICD-10-CM

## 2014-12-18 DIAGNOSIS — Z6841 Body Mass Index (BMI) 40.0 and over, adult: Secondary | ICD-10-CM | POA: Diagnosis not present

## 2014-12-18 DIAGNOSIS — Z7982 Long term (current) use of aspirin: Secondary | ICD-10-CM | POA: Diagnosis not present

## 2014-12-18 DIAGNOSIS — R079 Chest pain, unspecified: Secondary | ICD-10-CM

## 2014-12-18 DIAGNOSIS — G4733 Obstructive sleep apnea (adult) (pediatric): Secondary | ICD-10-CM

## 2014-12-18 DIAGNOSIS — J438 Other emphysema: Secondary | ICD-10-CM | POA: Insufficient documentation

## 2014-12-18 DIAGNOSIS — Z794 Long term (current) use of insulin: Secondary | ICD-10-CM | POA: Diagnosis not present

## 2014-12-18 DIAGNOSIS — I509 Heart failure, unspecified: Secondary | ICD-10-CM

## 2014-12-18 DIAGNOSIS — J9621 Acute and chronic respiratory failure with hypoxia: Secondary | ICD-10-CM

## 2014-12-18 DIAGNOSIS — Z881 Allergy status to other antibiotic agents status: Secondary | ICD-10-CM | POA: Diagnosis not present

## 2014-12-18 DIAGNOSIS — E871 Hypo-osmolality and hyponatremia: Secondary | ICD-10-CM | POA: Diagnosis not present

## 2014-12-18 DIAGNOSIS — F411 Generalized anxiety disorder: Secondary | ICD-10-CM | POA: Diagnosis present

## 2014-12-18 DIAGNOSIS — R918 Other nonspecific abnormal finding of lung field: Secondary | ICD-10-CM | POA: Insufficient documentation

## 2014-12-18 LAB — CBC
HCT: 41.8 % (ref 36.0–46.0)
Hemoglobin: 13.8 g/dL (ref 12.0–15.0)
MCH: 26.5 pg (ref 26.0–34.0)
MCHC: 33 g/dL (ref 30.0–36.0)
MCV: 80.4 fL (ref 78.0–100.0)
Platelets: 88 K/uL — ABNORMAL LOW (ref 150–400)
RBC: 5.2 MIL/uL — ABNORMAL HIGH (ref 3.87–5.11)
RDW: 19.6 % — ABNORMAL HIGH (ref 11.5–15.5)
WBC: 12.2 K/uL — ABNORMAL HIGH (ref 4.0–10.5)

## 2014-12-18 LAB — HCG, QUANTITATIVE, PREGNANCY

## 2014-12-18 LAB — COMPREHENSIVE METABOLIC PANEL WITH GFR
ALT: 63 U/L — ABNORMAL HIGH (ref 0–35)
AST: 56 U/L — ABNORMAL HIGH (ref 0–37)
Albumin: 3.2 g/dL — ABNORMAL LOW (ref 3.5–5.2)
Alkaline Phosphatase: 106 U/L (ref 39–117)
Anion gap: 9 (ref 5–15)
BUN: 14 mg/dL (ref 6–23)
CO2: 24 mmol/L (ref 19–32)
Calcium: 9 mg/dL (ref 8.4–10.5)
Chloride: 97 mmol/L (ref 96–112)
Creatinine, Ser: 0.96 mg/dL (ref 0.50–1.10)
GFR calc Af Amer: 80 mL/min — ABNORMAL LOW
GFR calc non Af Amer: 69 mL/min — ABNORMAL LOW
Glucose, Bld: 244 mg/dL — ABNORMAL HIGH (ref 70–99)
Potassium: 4.9 mmol/L (ref 3.5–5.1)
Sodium: 130 mmol/L — ABNORMAL LOW (ref 135–145)
Total Bilirubin: 2 mg/dL — ABNORMAL HIGH (ref 0.3–1.2)
Total Protein: 6.8 g/dL (ref 6.0–8.3)

## 2014-12-18 LAB — I-STAT TROPONIN, ED: TROPONIN I, POC: 0.02 ng/mL (ref 0.00–0.08)

## 2014-12-18 LAB — BRAIN NATRIURETIC PEPTIDE: B Natriuretic Peptide: 231 pg/mL — ABNORMAL HIGH (ref 0.0–100.0)

## 2014-12-18 LAB — GLUCOSE, CAPILLARY
Glucose-Capillary: 229 mg/dL — ABNORMAL HIGH (ref 70–99)
Glucose-Capillary: 171 mg/dL — ABNORMAL HIGH (ref 70–99)

## 2014-12-18 LAB — I-STAT ARTERIAL BLOOD GAS, ED
ACID-BASE DEFICIT: 2 mmol/L (ref 0.0–2.0)
Bicarbonate: 24.3 mEq/L — ABNORMAL HIGH (ref 20.0–24.0)
O2 Saturation: 94 %
PCO2 ART: 44 mmHg (ref 35.0–45.0)
PH ART: 7.35 (ref 7.350–7.450)
PO2 ART: 76 mmHg — AB (ref 80.0–100.0)
Patient temperature: 98.6
TCO2: 26 mmol/L (ref 0–100)

## 2014-12-18 LAB — POTASSIUM: Potassium: 4.5 mmol/L (ref 3.5–5.1)

## 2014-12-18 LAB — POC URINE PREG, ED: PREG TEST UR: NEGATIVE

## 2014-12-18 MED ORDER — HYDROMORPHONE HCL 1 MG/ML IJ SOLN
1.0000 mg | Freq: Once | INTRAMUSCULAR | Status: AC
  Administered 2014-12-18: 1 mg via INTRAVENOUS
  Filled 2014-12-18: qty 1

## 2014-12-18 MED ORDER — OXYCODONE HCL 10 MG PO TABS: 10.0000 mg | ORAL_TABLET | Freq: Four times a day (QID) | ORAL | Status: DC | PRN

## 2014-12-18 MED ORDER — IOHEXOL 350 MG/ML SOLN
75.0000 mL | Freq: Once | INTRAVENOUS | Status: AC | PRN
  Administered 2014-12-18: 75 mL via INTRAVENOUS

## 2014-12-18 MED ORDER — IBUPROFEN 400 MG PO TABS
400.0000 mg | ORAL_TABLET | Freq: Four times a day (QID) | ORAL | Status: DC | PRN
Start: 2014-12-18 — End: 2015-01-16

## 2014-12-18 MED ORDER — INSULIN GLARGINE 100 UNIT/ML SOLOSTAR PEN: 30.0000 [IU] | PEN_INJECTOR | Freq: Two times a day (BID) | SUBCUTANEOUS | Status: DC

## 2014-12-18 MED ORDER — ALBUTEROL (5 MG/ML) CONTINUOUS INHALATION SOLN
10.0000 mg/h | INHALATION_SOLUTION | Freq: Once | RESPIRATORY_TRACT | Status: AC
  Administered 2014-12-18: 10 mg/h via RESPIRATORY_TRACT
  Filled 2014-12-18: qty 20

## 2014-12-18 MED ORDER — POTASSIUM CHLORIDE CRYS ER 20 MEQ PO TBCR
40.0000 meq | EXTENDED_RELEASE_TABLET | ORAL | Status: AC
  Administered 2014-12-18 (×2): 40 meq via ORAL
  Filled 2014-12-18 (×2): qty 2

## 2014-12-18 MED ORDER — LORAZEPAM 2 MG/ML IJ SOLN
1.0000 mg | Freq: Once | INTRAMUSCULAR | Status: AC
  Administered 2014-12-18: 1 mg via INTRAVENOUS
  Filled 2014-12-18: qty 1

## 2014-12-18 MED ORDER — DICLOFENAC SODIUM 1 % TD GEL: 4.0000 g | Freq: Four times a day (QID) | TRANSDERMAL | Status: DC

## 2014-12-18 MED ORDER — IPRATROPIUM BROMIDE 0.02 % IN SOLN
0.5000 mg | Freq: Once | RESPIRATORY_TRACT | Status: AC
  Administered 2014-12-18: 0.5 mg via RESPIRATORY_TRACT
  Filled 2014-12-18: qty 2.5

## 2014-12-18 MED ORDER — METHYLPREDNISOLONE SODIUM SUCC 125 MG IJ SOLR
125.0000 mg | Freq: Once | INTRAMUSCULAR | Status: AC
  Administered 2014-12-18: 125 mg via INTRAVENOUS
  Filled 2014-12-18: qty 2

## 2014-12-18 MED ORDER — FUROSEMIDE 10 MG/ML IJ SOLN
80.0000 mg | Freq: Once | INTRAMUSCULAR | Status: AC
Start: 2014-12-18 — End: 2014-12-18
  Administered 2014-12-18: 80 mg via INTRAVENOUS
  Filled 2014-12-18: qty 8

## 2014-12-18 MED ORDER — POTASSIUM CHLORIDE 10 MEQ/100ML IV SOLN
10.0000 meq | INTRAVENOUS | Status: AC
  Administered 2014-12-18 (×2): 10 meq via INTRAVENOUS
  Filled 2014-12-18 (×2): qty 100

## 2014-12-18 MED ORDER — GUAIFENESIN ER 600 MG PO TB12: 1200.0000 mg | ORAL_TABLET | Freq: Two times a day (BID) | ORAL | Status: DC | PRN

## 2014-12-18 NOTE — ED Provider Notes (Signed)
CSN: [id number]     Arrival date & time 12/18/14  1827 History   First MD Initiated Contact with Patient 12/18/14 1832     Chief Complaint  Patient presents with  . Shortness of Breath     (Consider location/radiation/quality/duration/timing/severity/associated sxs/prior Treatment) HPI Comments: 49 year old female with history of COPD, CHF, recent hospital admission presents with shortness of breath. She was just discharged earlier today stated she went home and started to have shortness of breath. She also feels that she was panicking. She was sent home with ambulatory sats at 90% on her normal 4 L of home oxygen. Patient reports she was instructed to turn it up as needed. EMS found her at home on 8 L satting 52  Patient is a 49 y.o. female presenting with shortness of breath. The history is provided by the patient and the EMS personnel.  Shortness of Breath Severity:  Moderate Onset quality:  Sudden Timing:  Constant Progression:  Unchanged Chronicity:  Recurrent Context: emotional upset   Context: not animal exposure and not URI   Relieved by:  Nothing Worsened by:  Nothing tried Associated symptoms: no abdominal pain, no chest pain, no cough, no fever, no rash and no vomiting     Past Medical History  Diagnosis Date  . Hypertension   . Hyperlipidemia   . Chronic headache   . Fibromyalgia     daily narcotics  . Anxiety     hx chronic BZ use, stopped 07/2010  . Anemia   . Pulmonary sarcoidosis     unimpressive CT chest 2011  . Colonic polyp   . GERD (gastroesophageal reflux disease)   . ALLERGIC RHINITIS   . Asthma   . CHF (congestive heart failure)     Diastolic with fluid overload, May, 2012, LVEF 60%  . Morbid obesity   . Depression   . Panic attacks   . Diabetes mellitus   . COPD (chronic obstructive pulmonary disease)     on home O2, moderate airflow obstruction, suspect d/t emphysema  . Obesity   . Elevated LFTs 09/2011  . Ovarian cyst   . Chronic back pain    . Sleep apnea      CPAP  . Fatty liver   . Esophagitis   . Gastritis   . Internal hemorrhoids   . Adenomatous colon polyp 12/03/09    [facility] in [place], [place]   Past Surgical History  Procedure Laterality Date  . Polypectomy  2011  . Lumbar microdiscectomy  07/06/2011    R L4-5, stern  . Back surgery    . Carpal tunnel release    . Steroid spinal injections    . Hysteroscopy w/d&c N/A 03/25/2013    Procedure: DILATATION AND CURETTAGE /HYSTEROSCOPY;  Surgeon: [name], MD;  Location: [facility];  Service: Gynecology;  Laterality: N/A;  . Dilation and curettage of uterus     Family History  Problem Relation Age of Onset  . Hypertension Mother   . Emphysema Father   . Hypertension Father   . Stomach cancer Father   . Allergies Brother   . Hypertension Brother   . Stomach cancer Brother   . Heart disease Mother   . Heart disease Father   . Heart disease Brother     died age 23 sudden death/MI   History  Substance Use Topics  . Smoking status: Former Smoker -- 0.25 packs/day for 29 years    Types: Cigarettes    Quit date: 10/26/2014  .  Smokeless tobacco: Never Used  . Alcohol Use: 0.0 oz/week    0 Standard drinks or equivalent per week     Comment: occasionally   OB History    No data available     Review of Systems  Constitutional: Negative for fever.  Respiratory: Positive for shortness of breath. Negative for cough.   Cardiovascular: Negative for chest pain.  Gastrointestinal: Negative for vomiting and abdominal pain.  Skin: Negative for rash.  All other systems reviewed and are negative.     Allergies  Simvastatin; Sulfamethoxazole-trimethoprim; Zolpidem tartrate; Azithromycin; Doxycycline; Latex; Metformin and related; Metronidazole; Ceftin; Metolazone; Ciprofloxacin; and Tramadol  Home Medications   Prior to Admission medications   Medication Sig Start Date End Date Taking? Authorizing Provider  acetaminophen (TYLENOL) 325 MG tablet Take  1,300 mg by mouth every 6 (six) hours as needed.    Historical Provider, MD  albuterol (PROVENTIL) (2.5 MG/3ML) 0.083% nebulizer solution Take 3 mLs (2.5 mg total) by nebulization every 6 (six) hours as needed for wheezing or shortness of breath. 12/08/14   Rowe Clack, MD  ALPRAZolam Duanne Moron) 1 MG tablet Take 1 mg by mouth 3 (three) times daily.    Historical Provider, MD  aspirin EC 81 MG tablet Take 81 mg by mouth daily.    Historical Provider, MD  BIOTIN PO Take 1 capsule by mouth daily.    Historical Provider, MD  cetirizine (ZYRTEC) 1 MG/ML syrup TAKE 10 ML BY MOUTH EVERY NIGHT AT BEDTIME 09/07/14   Rowe Clack, MD  Coenzyme Q10 (CO Q 10) 10 MG CAPS Take 1 capsule by mouth daily.    Historical Provider, MD  diclofenac sodium (VOLTAREN) 1 % GEL Apply 4 g topically 4 (four) times daily. 12/18/14   Debbe Odea, MD  esomeprazole (NEXIUM) 40 MG capsule Take 40 mg by mouth 2 (two) times daily before a meal.    Historical Provider, MD  fluticasone (FLONASE) 50 MCG/ACT nasal spray Place 2 sprays into both nostrils daily. 10/19/14   Rowe Clack, MD  gabapentin (NEURONTIN) 300 MG capsule Take 300 mg by mouth at bedtime.    Historical Provider, MD  glimepiride (AMARYL) 1 MG tablet Take 1 mg by mouth daily with breakfast.    Historical Provider, MD  guaiFENesin (MUCINEX) 600 MG 12 hr tablet Take 2 tablets (1,200 mg total) by mouth 2 (two) times daily as needed for to loosen phlegm. 12/18/14   Debbe Odea, MD  hydrOXYzine (ATARAX/VISTARIL) 10 MG tablet TAKE 1 TABLET BY MOUTH THREE TIMES DAILY AS NEEDED FOR ITCHING 08/27/14   Biagio Borg, MD  ibuprofen (ADVIL,MOTRIN) 400 MG tablet Take 1 tablet (400 mg total) by mouth every 6 (six) hours as needed for moderate pain. 12/18/14   Debbe Odea, MD  insulin aspart (NOVOLOG FLEXPEN) 100 UNIT/ML FlexPen Use as directed per sliding scale indicated by your physician. Patient taking differently: Inject 1-11 Units into the skin 3 (three) times daily  with meals. Use as directed per sliding scale indicated by your physician. 09/22/14   Rowe Clack, MD  Insulin Glargine (LANTUS SOLOSTAR) 100 UNIT/ML Solostar Pen Inject 30 Units into the skin 2 (two) times daily. 12/18/14   Debbe Odea, MD  Multiple Vitamin (MULTIVITAMIN) capsule Take 1 capsule by mouth daily.    Historical Provider, MD  omega-3 fish oil (MAXEPA) 1000 MG CAPS capsule Take 1 capsule by mouth daily.    Historical Provider, MD  ONE TOUCH ULTRA TEST test strip  11/01/14  Historical Provider, MD  Oxycodone HCl 10 MG TABS Take 1 tablet (10 mg total) by mouth every 6 (six) hours as needed. 12/18/14   Debbe Odea, MD  potassium chloride SA (K-DUR,KLOR-CON) 20 MEQ tablet Take 2 tablets (40 mEq total) by mouth 2 (two) times daily. 11/13/14   Rowe Clack, MD  PROAIR HFA 108 (90 BASE) MCG/ACT inhaler INHALE 2 PUFFS BY MOUTH EVERY 6 HOURS AS NEEDED 08/28/14   Rowe Clack, MD  SPIRIVA HANDIHALER 18 MCG inhalation capsule INHALE CONTENTS OF 1 CAPSULE USING HANDIHALER EVERY DAY 12/16/14   Kathee Delton, MD  SYMBICORT 160-4.5 MCG/ACT inhaler INHALE 2 PUFFS BY MOUTH TWICE DAILY 10/19/14   Kathee Delton, MD  tiZANidine (ZANAFLEX) 4 MG tablet TAKE 1 TABLET BY MOUTH EVERY 8 HOURS AS NEEDED FOR MUSCLE SPASMS 11/10/14   Rowe Clack, MD  torsemide (DEMADEX) 100 MG tablet Take 1 tablet (100 mg total) by mouth 2 (two) times daily. 07/30/14   Biagio Borg, MD  varenicline (CHANTIX) 0.5 MG tablet Take 1 tablet (0.5 mg total) by mouth 2 (two) times daily. 11/03/14   Golden Circle, FNP  venlafaxine XR (EFFEXOR XR) 37.5 MG 24 hr capsule Take 1 capsule (37.5 mg total) by mouth daily with breakfast. 11/13/14   Rowe Clack, MD   LMP 10/16/2012 Physical Exam  Constitutional: She is oriented to person, place, and time. She appears well-developed and well-nourished. No distress.  HENT:  Head: Normocephalic and atraumatic.  Mouth/Throat: Oropharynx is clear and moist.  Eyes: EOM are  normal. Pupils are equal, round, and reactive to light.  Neck: Normal range of motion. Neck supple.  Cardiovascular: Normal rate and regular rhythm.  Exam reveals no friction rub.   No murmur heard. Pulmonary/Chest: She is in respiratory distress. She has decreased breath sounds (diffuse, moderate). She has wheezes (mild). She has no rhonchi. She has no rales.  Abdominal: Soft. She exhibits no distension. There is no tenderness. There is no rebound.  Musculoskeletal: Normal range of motion. She exhibits no edema.  Neurological: She is alert and oriented to person, place, and time. No cranial nerve deficit. She exhibits normal muscle tone.  Skin: No rash noted. She is not diaphoretic.  Nursing note and vitals reviewed.   ED Course  Procedures (including critical care time) Labs Review Labs Reviewed  CBC  COMPREHENSIVE METABOLIC PANEL  BRAIN NATRIURETIC PEPTIDE  BLOOD GAS, ARTERIAL  I-STAT TROPOININ, ED    Imaging Review Nm Pulmonary Perf And Vent  12/17/2014   CLINICAL DATA:  49 year old female with shortness of breath and chest discomfort. Initial encounter.  EXAM: NUCLEAR MEDICINE VENTILATION - PERFUSION LUNG SCAN  TECHNIQUE: Ventilation images were obtained in multiple projections using inhaled aerosol technetium 99 M DTPA. Perfusion images were obtained in multiple projections after intravenous injection of Tc-31m MAA.  RADIOPHARMACEUTICALS:  44.0 mCi Tc-62m DTPA aerosol and 5 point for mCi Tc-74m MAA  COMPARISON:  Portable chest radiograph 12/16/2014.  FINDINGS: Ventilation: Is heterogeneous bilaterally, in part due to central clumping of the ventilation agent. Scattered bilateral peripheral ventilation defects.  Perfusion: Scattered bilateral small and peripheral perfusion defects, but appear matched in both lungs.  IMPRESSION: Abnormal scan with matched bilateral pulmonary ventilation and perfusion defects that could reflect chronic obstructed pulmonary disease or chronic pulmonary  embolus.  No scintigraphic evidence of acute PE.   Electronically Signed   By: Genevie Ann M.D.   On: 12/17/2014 13:05     EKG Interpretation None  CRITICAL CARE Performed by: Osvaldo Shipper   Total critical care time: 45 minutes  Critical care time was exclusive of separately billable procedures and treating other patients.  Critical care was necessary to treat or prevent imminent or life-threatening deterioration.  Critical care was time spent personally by me on the following activities: development of treatment plan with patient and/or surrogate as well as nursing, discussions with consultants, evaluation of patient's response to treatment, examination of patient, obtaining history from patient or surrogate, ordering and performing treatments and interventions, ordering and review of laboratory studies, ordering and review of radiographic studies, pulse oximetry and re-evaluation of patient's condition.   MDM   Final diagnoses:  Acute respiratory failure with hypoxia  Shortness of breath    49 year old female with history of COPD, CHF, recent hospital admission presents with shortness of breath. She was just discharged earlier today stated she went home and started to have shortness of breath. She also feels that she was panicking. She was sent home with ambulatory sats at 90% on her normal 4 L of home oxygen. Patient reports she was instructed to turn it up as needed. EMS found her at home on 8 L satting 83%.  She was noted to have a VQ scan yesterday which showed either chronic scarring with her COPD or chronic PE. Did not show any acute evidence of PE.  Here tachypneic, panicked. Lungs diminished diffusely. We'll check labs, EKG, ABG. Will check CXR. She finished a course of antibiotics in the hospital.  Labs show hypo natremia. Mild white count. BNP 2:30, highest it's been, last omission was around 20. Chest x-ray showed CHF. 80 mg of Lasix given IV. Patient is  unable to tolerate oxygen lower than 10 L/m on facemask. When on 8L/min, patient was satting at 82%, witnessed by me. Admitted to [facility].  Evelina Bucy, MD 12/18/14 [phone]

## 2014-12-18 NOTE — Discharge Summary (Signed)
Physician Discharge Summary  [name] NLZ:[id number] DOB: 27-Mar-1966 DOA: 12/09/2014  PCP: Yvette Riddle, MD- previously was seeing Dr [name] but as she is transitioning out of practice   Admit date: 12/09/2014 Discharge date: 12/18/2014  Time spent: 50 minutes  Recommendations for Outpatient Follow-up:  1. Please check platelet level  2. Need f/u with pulmonary if further hypoxia at outpt- may need to be on chronic prednisone  Discharge Condition: stable Diet recommendation: heart healthy, diabetic, low sodium  Discharge Diagnoses:  Principal Problem:   Acute on chronic respiratory failure with hypercapnia Active Problems:   Hypokalemia   COPD exacerbation   CAP (community acquired pneumonia)   Acute on chronic diastolic heart failure   History of present illness:  Patient is a pleasant 49 year old female with a past medical history morbid obesity, obesity hypoventilation syndrome, chronic objective pulmonary disease, chronic diastolic congestive heart failure,IDDM admitted to the medicine service on 12/09/2014 when she presented with complaints of increasing shortness of breath. She was initially worked up with a chest x-ray which showed vascular congestion, bilateral central airspace opacities concerning for mild pulmonary edema, although pneumonia could also present with similar changes. She was started on empiric antibiotic therapy with Levaquin. On 12/14/2014 patient lethargic, hypoxemic, on noninvasive positive pressure ventilation, ABG showing a PCO2 of 57.4, increased from 47.4. Pulmonary critical care medicine consulted, transferred to step down unit for closer monitoring. Suspected to be secondary to oversedation from benzodiazepine and narcotic therapy received overnight along with CHF. She was given 40 mg of IV Lasix 1. Patient showing clinical improvement over the course of the morning in the step down unit.  I started seeing the patient on 3/22.  Hospital  Course:  Acute on chronic hypoxemic hypercarbic respiratory failure - admitted with dyspnea and hypoxia- when to SDU on 3/20 for hypercarbic resp failure -Pulmonary critical care medicine consulted, did not recommend CT or antimicrobial therapy, as respiratory failure likely due to oversedation from benzodiazepine and narcotic analgesics administered overnight along with CHF.  - unable to obtain CT to r/o PE as she did not have the right type of IV- VQ scan- neg for PE - encouraged to get OOB to chair -started IS - was still very hypoxic on exertion -hypoxia improved with diuresis- now pulse ox 91% on her usual amount of 4 L of O2  (A) Possible community acquired pneumonia. -Patient presented with complaints of cough associated with shortness of breath. Initial chest x-ray showed bilateral airspace opacities- repeat xrays continue to show them and now I suspect they are likely due to scarring -Blood cultures obtained on 12/09/2014 showed no growth 2 sets -Patient received 5 days of Levaquin completed on 3/21 - she tells me that she has a chronic unchanged cough- likely has chronic bronchitis - Pulmonary critical care medicine did not recommend further antibiotic therapy   (B) Possible superimposed COPD exacerbation - d/c Prednisone- last dose 3/22  (C) Acute on Chronic diastolic congestive heart failure. -Last transthoracic echocardiogram performed on 11/21/2013 that showed an EF of 90-24%, grade 1 diastolic dysfunction - I have diuresed her as much as possible-  - she told me that her Aldactone had been held when last discharged- this appears to have been due to renal failure- see above regarding diuresis- pulse ox, Cr and symptoms have improved -  I have resumed Aldactone and asked her to follow daily weights at home  (D)OSA/ [facility] - cont Cpap   Poorly controlled insulin dependent diabetes mellitus -Patient reporting  taking Lantus 30 units subcutaneous changes twice a day at home but  tells me she was NOT TAKING HER NOVOLOG. -During this hospitalization she was started on steroids with her blood sugars have been mostly in the 200 range -resume home insulin on d/c- no longer on steroids  Hypokalemia - replaced  Acute thrombocytopenia - dropped from 145 to 76- rechecked Plt count- - 78 - stopped Lovenox 3/22- placed SCDs- checked for  HIT- level was normal - platelets steadily improving- cause uncertain  Chronic Back pain? - tells me she needs surgery for her back but cannot get it due to co-morbidities- asking for Narcotics- I have given her 10 tabs of percocet 10s- she states Dr [name] office would not prescribe her pain meds which makes me doubt the authenticity of her pain- she has an appt with pain management coming up   Consultations:  PCCM  Discharge Exam: Filed Weights   12/16/14 0330 12/16/14 1538 12/18/14 0746  Weight: 136.5 kg (300 lb 14.9 oz) 132.8 kg (292 lb 12.3 oz) 136.9 kg (301 lb 13 oz)   Filed Vitals:   12/18/14 0531  BP: 141/79  Pulse: 96  Temp: 98.4 F (36.9 C)  Resp: 20    General: AAO x 3, no distress Cardiovascular: RRR, no murmurs  Respiratory: clear to auscultation bilaterally GI: soft, non-tender, non-distended, bowel sound positive  Discharge Instructions You were cared for by a hospitalist during your hospital stay. If you have any questions about your discharge medications or the care you received while you were in the hospital after you are discharged, you can call the unit and asked to speak with the hospitalist on call if the hospitalist that took care of you is not available. Once you are discharged, your primary care physician will handle any further medical issues. Please note that NO REFILLS for any discharge medications will be authorized once you are discharged, as it is imperative that you return to your primary care physician (or establish a relationship with a primary care physician if you do not have one) for your  aftercare needs so that they can reassess your need for medications and monitor your lab values.      Discharge Instructions    Discharge instructions    Complete by:  As directed   Diet- low sodium, heart healthy, carb modified     Increase activity slowly    Complete by:  As directed             Medication List    STOP taking these medications        CAPSAICIN EX     Insulin Pen Needle 31G X 8 MM Misc     naproxen sodium 220 MG tablet  Commonly known as:  ANAPROX      TAKE these medications        acetaminophen 325 MG tablet  Commonly known as:  TYLENOL  Take 1,300 mg by mouth every 6 (six) hours as needed.     ALPRAZolam 1 MG tablet  Commonly known as:  XANAX  Take 1 mg by mouth 3 (three) times daily.     aspirin EC 81 MG tablet  Take 81 mg by mouth daily.     BIOTIN PO  Take 1 capsule by mouth daily.     cetirizine 1 MG/ML syrup  Commonly known as:  ZYRTEC  TAKE 10 ML BY MOUTH EVERY NIGHT AT BEDTIME     Co Q 10 10 MG Caps  Take  1 capsule by mouth daily.     diclofenac sodium 1 % Gel  Commonly known as:  VOLTAREN  Apply 4 g topically 4 (four) times daily.     esomeprazole 40 MG capsule  Commonly known as:  NEXIUM  Take 40 mg by mouth 2 (two) times daily before a meal.     fluticasone 50 MCG/ACT nasal spray  Commonly known as:  FLONASE  Place 2 sprays into both nostrils daily.     gabapentin 300 MG capsule  Commonly known as:  NEURONTIN  Take 300 mg by mouth at bedtime.     glimepiride 1 MG tablet  Commonly known as:  AMARYL  Take 1 mg by mouth daily with breakfast.     guaiFENesin 600 MG 12 hr tablet  Commonly known as:  MUCINEX  Take 2 tablets (1,200 mg total) by mouth 2 (two) times daily as needed for to loosen phlegm.     hydrOXYzine 10 MG tablet  Commonly known as:  ATARAX/VISTARIL  TAKE 1 TABLET BY MOUTH THREE TIMES DAILY AS NEEDED FOR ITCHING     ibuprofen 400 MG tablet  Commonly known as:  ADVIL,MOTRIN  Take 1 tablet (400 mg  total) by mouth every 6 (six) hours as needed for moderate pain.     insulin aspart 100 UNIT/ML FlexPen  Commonly known as:  NOVOLOG FLEXPEN  Use as directed per sliding scale indicated by your physician.     Insulin Glargine 100 UNIT/ML Solostar Pen  Commonly known as:  LANTUS SOLOSTAR  Inject 30 Units into the skin 2 (two) times daily.     multivitamin capsule  Take 1 capsule by mouth daily.     omega-3 fish oil 1000 MG Caps capsule  Commonly known as:  MAXEPA  Take 1 capsule by mouth daily.     ONE TOUCH ULTRA TEST test strip  Generic drug:  glucose blood     Oxycodone HCl 10 MG Tabs  Take 1 tablet (10 mg total) by mouth every 6 (six) hours as needed.     potassium chloride SA 20 MEQ tablet  Commonly known as:  K-DUR,KLOR-CON  Take 2 tablets (40 mEq total) by mouth 2 (two) times daily.     PROAIR HFA 108 (90 BASE) MCG/ACT inhaler  Generic drug:  albuterol  INHALE 2 PUFFS BY MOUTH EVERY 6 HOURS AS NEEDED     albuterol (2.5 MG/3ML) 0.083% nebulizer solution  Commonly known as:  PROVENTIL  Take 3 mLs (2.5 mg total) by nebulization every 6 (six) hours as needed for wheezing or shortness of breath.     SPIRIVA HANDIHALER 18 MCG inhalation capsule  Generic drug:  tiotropium  INHALE CONTENTS OF 1 CAPSULE USING HANDIHALER EVERY DAY     SYMBICORT 160-4.5 MCG/ACT inhaler  Generic drug:  budesonide-formoterol  INHALE 2 PUFFS BY MOUTH TWICE DAILY     tiZANidine 4 MG tablet  Commonly known as:  ZANAFLEX  TAKE 1 TABLET BY MOUTH EVERY 8 HOURS AS NEEDED FOR MUSCLE SPASMS     torsemide 100 MG tablet  Commonly known as:  DEMADEX  Take 1 tablet (100 mg total) by mouth 2 (two) times daily.     varenicline 0.5 MG tablet  Commonly known as:  CHANTIX  Take 1 tablet (0.5 mg total) by mouth 2 (two) times daily.     venlafaxine XR 37.5 MG 24 hr capsule  Commonly known as:  EFFEXOR XR  Take 1 capsule (37.5 mg total) by mouth daily  with breakfast.       Allergies  Allergen  Reactions  . Simvastatin Other (See Comments)    Severe leg pain and burning  . Sulfamethoxazole-Trimethoprim Nausea And Vomiting    Causes Projectile Vomiting  . Zolpidem Tartrate Other (See Comments)    Chest pain  . Azithromycin Other (See Comments)    Resistant to med  . Doxycycline Other (See Comments)    Resistant to med  . Latex Itching and Rash    Also burning sensations  . Metformin And Related Diarrhea    Severe diarrhea  . Metronidazole Nausea And Vomiting  . Ceftin [Cefuroxime]     MOUTH SWELLING  . Metolazone Other (See Comments)    Cramps,pain  . Ciprofloxacin Nausea Only  . Tramadol Itching and Nausea Only   Follow-up Information    Follow up with Yvette Riddle, MD.   Specialty:  Cardiology   Contact information:   [place] [address] [phone]        The results of significant diagnostics from this hospitalization (including imaging, microbiology, ancillary and laboratory) are listed below for reference.    Significant Diagnostic Studies: Dg Chest 2 View  12/13/2014   CLINICAL DATA:  Weakness and shortness of Breath  EXAM: CHEST  2 VIEW  COMPARISON:  12/11/2014  FINDINGS: Cardiac shadow is mildly enlarged. Patchy changes are noted in the bases bilaterally stable from the prior exam. No new focal infiltrate is seen. No sizable effusion is noted.  IMPRESSION: Patchy bibasilar changes.  No acute abnormality is noted.   Electronically Signed   By: [name] M.D.   On: 12/13/2014 11:07   Dg Chest 2 View  12/11/2014   CLINICAL DATA:  Short of breath.  History pneumonia.  EXAM: CHEST  2 VIEW  COMPARISON:  12/09/2014  FINDINGS: Chronic lung disease with hyperinflation and bibasilar scarring. Prominent lung markings in the bases are stable with prior studies  Cardiac enlargement with pulmonary vascular congestion. Findings are consistent with mild fluid overload without edema or effusion. No definite pneumonia.  IMPRESSION: Chronic lung  disease bibasilar scarring which appears stable  Cardiac enlargement with mild vascular congestion suggesting mild fluid overload.  No change from the prior study.   Electronically Signed   By: Franchot Gallo M.D.   On: 12/11/2014 10:36   Dg Chest 2 View (if Patient Has Fever And/or Copd)  12/09/2014   CLINICAL DATA:  Acute onset of severe shortness of breath. Initial encounter.  EXAM: CHEST  2 VIEW  COMPARISON:  Chest radiograph performed 10/31/2014  FINDINGS: The lungs are well-aerated. Vascular congestion is noted. Bilateral central airspace opacities raise concern for pulmonary edema, superimposed on chronic lung changes as previously noted. Mild pneumonia might have a similar appearance. There is no evidence of pleural effusion or pneumothorax.  The heart is borderline normal in size. No acute osseous abnormalities are seen.  IMPRESSION: Vascular congestion noted. Bilateral central airspace opacities raise concern for mild pulmonary edema, superimposed on chronic lung changes. Mild pneumonia might have a similar appearance.   Electronically Signed   By: Garald Balding M.D.   On: 12/09/2014 04:22   Nm Pulmonary Perf And Vent  12/17/2014   CLINICAL DATA:  49 year old female with shortness of breath and chest discomfort. Initial encounter.  EXAM: NUCLEAR MEDICINE VENTILATION - PERFUSION LUNG SCAN  TECHNIQUE: Ventilation images were obtained in multiple projections using inhaled aerosol technetium 99 M DTPA. Perfusion images were obtained in multiple projections after intravenous  injection of Tc-73m MAA.  RADIOPHARMACEUTICALS:  44.0 mCi Tc-44m DTPA aerosol and 5 point for mCi Tc-26m MAA  COMPARISON:  Portable chest radiograph 12/16/2014.  FINDINGS: Ventilation: Is heterogeneous bilaterally, in part due to central clumping of the ventilation agent. Scattered bilateral peripheral ventilation defects.  Perfusion: Scattered bilateral small and peripheral perfusion defects, but appear matched in both lungs.   IMPRESSION: Abnormal scan with matched bilateral pulmonary ventilation and perfusion defects that could reflect chronic obstructed pulmonary disease or chronic pulmonary embolus.  No scintigraphic evidence of acute PE.   Electronically Signed   By: Genevie Ann M.D.   On: 12/17/2014 13:05   Dg Chest Port 1 View  12/16/2014   CLINICAL DATA:  Shortness of breath, respiratory failure, CHF and pulmonary infiltrates.  EXAM: PORTABLE CHEST - 1 VIEW  COMPARISON:  12/14/2014  FINDINGS: Bibasilar opacities, right greater than left are favored to largely represent chronic areas of scarring and atelectasis when reviewing prior chest x-rays. No progressive pulmonary airspace consolidation is identified. There is no evidence of overt pulmonary edema or pleural fluid. No pneumothorax is identified. The heart size is stable and normal.  IMPRESSION: Remaining bibasilar opacities, right greater than left, are favored to represent chronic areas of scarring and atelectasis.   Electronically Signed   By: Aletta Edouard M.D.   On: 12/16/2014 11:38   Dg Chest Port 1 View  12/14/2014   CLINICAL DATA:  Shortness of breath and chest pain  EXAM: PORTABLE CHEST - 1 VIEW  COMPARISON:  12/13/2014  FINDINGS: Cardiac shadow remains enlarged. Patchy infiltrate is noted in the right lung base. Some diffuse interstitial changes are noted bilaterally which may be related to superimposed congestive failure. This is more prominent than that seen on the prior exam.  IMPRESSION: Stable patchy right basilar infiltrate.  Increasing vascular congestion and interstitial edema.   Electronically Signed   By: [name] M.D.   On: 12/14/2014 10:57    Microbiology: Recent Results (from the past 240 hour(s))  Culture, blood (routine x 2)     Status: None   Collection Time: 12/09/14  9:45 AM  Result Value Ref Range Status   Specimen Description BLOOD LEFT ARM  Final   Special Requests BOTTLES DRAWN AEROBIC AND ANAEROBIC 8CC  Final   Culture   Final     NO GROWTH 5 DAYS Performed at [facility]    Report Status 12/15/2014 FINAL  Final  Culture, blood (routine x 2)     Status: None   Collection Time: 12/09/14  9:50 AM  Result Value Ref Range Status   Specimen Description BLOOD LEFT ARM  Final   Special Requests BOTTLES DRAWN AEROBIC ONLY 4CC  Final   Culture   Final    NO GROWTH 5 DAYS Performed at [facility]    Report Status 12/15/2014 FINAL  Final  MRSA PCR Screening     Status: Abnormal   Collection Time: 12/14/14  9:30 AM  Result Value Ref Range Status   MRSA by PCR POSITIVE (A) NEGATIVE Final    Comment:        The GeneXpert MRSA Assay (FDA approved for NASAL specimens only), is one component of a comprehensive MRSA colonization surveillance program. It is not intended to diagnose MRSA infection nor to guide or monitor treatment for MRSA infections. RESULT CALLED TO, READ BACK BY AND VERIFIED WITH: MAIN,S @ 1215 ON [id number] BY POTEAT,S      Labs: Basic Metabolic Panel:  Recent  Labs Lab 12/13/14 0820 12/14/14 0430 12/15/14 0345 12/16/14 0820 12/17/14 0550  NA 135 133* 135 136 137  K 3.8 3.0* 3.6 3.3* 2.9*  CL 90* 86* 93* 93* 93*  CO2 36* 35* 33* 31 31  GLUCOSE 158* 145* 231* 207* 167*  BUN 23 26* 29* 29* 23  CREATININE 1.00 1.11* 1.18* 1.09 0.80  CALCIUM 9.3 8.6 8.3* 9.6 9.0   Liver Function Tests: No results for input(s): AST, ALT, ALKPHOS, BILITOT, PROT, ALBUMIN in the last 168 hours. No results for input(s): LIPASE, AMYLASE in the last 168 hours. No results for input(s): AMMONIA in the last 168 hours. CBC:  Recent Labs Lab 12/13/14 0820 12/15/14 0345 12/15/14 1303 12/16/14 0820 12/17/14 0550  WBC 8.5 11.2* 11.2* 9.6 9.2  HGB 13.2 13.5 13.8 15.1* 14.3  HCT 41.9 41.7 42.1 46.4* 43.9  MCV 82.3 81.1 82.1 81.7 81.6  PLT 134* 76* 78* 91* 106*   Cardiac Enzymes:  Recent Labs Lab 12/14/14 0921  TROPONINI 0.05*   BNP: BNP (last 3 results)  Recent Labs  10/18/14 1355  12/09/14 0330 12/13/14 0820  BNP 11.1 31.0 29.7    ProBNP (last 3 results)  Recent Labs  02/11/14 0021 04/17/14 0120 07/20/14 1817  PROBNP 32.8 44.7 39.7    CBG:  Recent Labs Lab 12/17/14 1233 12/17/14 1636 12/17/14 2038 12/18/14 0806 12/18/14 1224  GLUCAP 87 194* 157* 229* 171*       SignedDebbe Odea, MD [facility] 12/18/2014, 1:54 PM

## 2014-12-18 NOTE — ED Notes (Signed)
Per EMS: Pt discharged from [facility] this morning. Was there for COPD. Began experiencing some SOB and difficulty breathing. On 6L O2 at home. MD at bedside.

## 2014-12-18 NOTE — Progress Notes (Signed)
SATURATION QUALIFICATIONS: (This note is used to comply with regulatory documentation for home oxygen)  Patient Saturations on Room Air at Rest = 86%  Patient Saturations on Room Air while Ambulating = 82%  Patient Saturations on 4 Liters of oxygen while Ambulating = 90%  Please briefly explain why patient needs home oxygen: Patient already has oxygen therapy at home, patient experiences periods of shortness of breath.

## 2014-12-18 NOTE — H&P (Addendum)
[facility] History and Physical  ASCENCION STEGNER GUY:[id number] DOB: 06/23/1966 DOA: 12/18/2014  Referring physician: ED physician PCP: Birdie Riddle, MD  Specialists:   Chief Complaint: worsening sob  HPI: [name] is a 49 y.o. female with past medical history of asthma, COPD, diastolic congestive heart failure (EF 55-60% with grade 1 diastolic dysfunction), diabetes mellitus, GERD, depression, anxiety disorder, who presents with worsening shortness of breath.  Patient was recently hospitalized from 3/16-3/25, and was treated for community acquired pneumonia, congestive heart failure exacerbation and COPD exacerbation. She completed 5 days of Levaquin treatment. Her flu PCR test was negative. She was just discharged home today. Patient comes back because worsening shortness of breath. Patient has dry cough, wheezing and oxygen desaturation even with home oxygen. EMS found her at home on 8 L sating 83. She has some mild chest discomfort, but no obvious chest pain. She does not have runny nose or sore throat. She also feels that she was panicking. Patient denies fever, chills, abdominal pain, diarrhea, constipation, dysuria, urgency, frequency, hematuria, skin rashes or leg swelling. No unilateral weakness, numbness or tingling sensations. No vision change or hearing loss. Of note, she had VQ scan yesterday which showed either chronic scarring with her COPD or chronic PE, and did not show any acute evidence of PE.  In ED, patient was found to have congestive heart failure changes on chest x-ray, WBC 12.2 (patient just completed prednisone on 3/22), negative troponin, BNP 231, electrolytes okay, renal function okay, AST 56, ALT 63, total bilirubin 2.0, tachycardia, oxygen saturation 88 on mask, temperature 98.5. ABG showed a pH 7.350, PCO2 44, PaO2 76%. Patient is admitted to inpatient for further evaluation and treatment.  Review of Systems: As presented in the history of presenting  illness, rest negative.  Where does patient live?  At home Can patient participate in ADLs? Yes  Allergy:  Allergies  Allergen Reactions  . Simvastatin Other (See Comments)    Severe leg pain and burning  . Sulfamethoxazole-Trimethoprim Nausea And Vomiting    Causes Projectile Vomiting  . Zolpidem Tartrate Other (See Comments)    Chest pain  . Azithromycin Other (See Comments)    Resistant to med  . Ceftin [Cefuroxime] Swelling and Other (See Comments)    MOUTH SWELLING  . Doxycycline Other (See Comments)    Resistant to med  . Latex Itching and Rash    Also burning sensations  . Metformin And Related Diarrhea    Severe diarrhea  . Metolazone Other (See Comments)    MYALGIAS  . Metronidazole Nausea And Vomiting  . Ciprofloxacin Nausea Only  . Tramadol Itching and Nausea Only    Past Medical History  Diagnosis Date  . Hypertension   . Hyperlipidemia   . Chronic headache   . Fibromyalgia     daily narcotics  . Anxiety     hx chronic BZ use, stopped 07/2010  . Anemia   . Pulmonary sarcoidosis     unimpressive CT chest 2011  . Colonic polyp   . GERD (gastroesophageal reflux disease)   . ALLERGIC RHINITIS   . Asthma   . CHF (congestive heart failure)     Diastolic with fluid overload, May, 2012, LVEF 60%  . Morbid obesity   . Depression   . Panic attacks   . Diabetes mellitus   . COPD (chronic obstructive pulmonary disease)     on home O2, moderate airflow obstruction, suspect d/t emphysema  . Obesity   . Elevated  LFTs 09/2011  . Ovarian cyst   . Chronic back pain   . Sleep apnea      CPAP  . Fatty liver   . Esophagitis   . Gastritis   . Internal hemorrhoids   . Adenomatous colon polyp 12/03/09    [facility] in [place], [place]    Past Surgical History  Procedure Laterality Date  . Polypectomy  2011  . Lumbar microdiscectomy  07/06/2011    R L4-5, stern  . Back surgery    . Carpal tunnel release    . Steroid spinal injections    . Hysteroscopy  w/d&c N/A 03/25/2013    Procedure: DILATATION AND CURETTAGE /HYSTEROSCOPY;  Surgeon: [name], MD;  Location: [facility];  Service: Gynecology;  Laterality: N/A;  . Dilation and curettage of uterus      Social History:  reports that she quit smoking about 7 weeks ago. Her smoking use included Cigarettes. She has a 7.25 pack-year smoking history. She has never used smokeless tobacco. She reports that she drinks alcohol. She reports that she uses illicit drugs (Cocaine and Marijuana).  Family History:  Family History  Problem Relation Age of Onset  . Hypertension Mother   . Emphysema Father   . Hypertension Father   . Stomach cancer Father   . Allergies Brother   . Hypertension Brother   . Stomach cancer Brother   . Heart disease Mother   . Heart disease Father   . Heart disease Brother     died age 101 sudden death/MI     Prior to Admission medications   Medication Sig Start Date End Date Taking? Authorizing Provider  acetaminophen (TYLENOL) 325 MG tablet Take 1,300 mg by mouth every 6 (six) hours as needed for moderate pain.    Yes Historical Provider, MD  albuterol (PROVENTIL) (2.5 MG/3ML) 0.083% nebulizer solution Take 3 mLs (2.5 mg total) by nebulization every 6 (six) hours as needed for wheezing or shortness of breath. 12/08/14  Yes Rowe Clack, MD  ALPRAZolam Duanne Moron) 1 MG tablet Take 1 mg by mouth 3 (three) times daily.   Yes Historical Provider, MD  aspirin EC 81 MG tablet Take 81 mg by mouth daily.   Yes Historical Provider, MD  BIOTIN PO Take 1 capsule by mouth daily.   Yes Historical Provider, MD  cetirizine (ZYRTEC) 1 MG/ML syrup TAKE 10 ML BY MOUTH EVERY NIGHT AT BEDTIME 09/07/14  Yes Rowe Clack, MD  Coenzyme Q10 (CO Q 10) 10 MG CAPS Take 1 capsule by mouth daily.   Yes Historical Provider, MD  diclofenac sodium (VOLTAREN) 1 % GEL Apply 4 g topically 4 (four) times daily. 12/18/14  Yes Debbe Odea, MD  esomeprazole (NEXIUM) 40 MG capsule Take 40 mg by mouth 2  (two) times daily before a meal.   Yes Historical Provider, MD  fluticasone (FLONASE) 50 MCG/ACT nasal spray Place 2 sprays into both nostrils daily. 10/19/14  Yes Rowe Clack, MD  gabapentin (NEURONTIN) 300 MG capsule Take 300 mg by mouth at bedtime.   Yes Historical Provider, MD  glimepiride (AMARYL) 1 MG tablet Take 1 mg by mouth daily with breakfast.   Yes Historical Provider, MD  guaiFENesin (MUCINEX) 600 MG 12 hr tablet Take 2 tablets (1,200 mg total) by mouth 2 (two) times daily as needed for to loosen phlegm. 12/18/14  Yes Debbe Odea, MD  insulin aspart (NOVOLOG FLEXPEN) 100 UNIT/ML FlexPen Use as directed per sliding scale indicated by your physician. Patient  taking differently: Inject 0-11 Units into the skin 3 (three) times daily with meals. Use as directed per sliding scale indicated by your physician. 09/22/14  Yes Rowe Clack, MD  Insulin Glargine (LANTUS SOLOSTAR) 100 UNIT/ML Solostar Pen Inject 30 Units into the skin 2 (two) times daily. 12/18/14  Yes Debbe Odea, MD  Multiple Vitamin (MULTIVITAMIN) capsule Take 1 capsule by mouth daily.   Yes Historical Provider, MD  omega-3 fish oil (MAXEPA) 1000 MG CAPS capsule Take 1 capsule by mouth daily.   Yes Historical Provider, MD  Oxycodone HCl 10 MG TABS Take 1 tablet (10 mg total) by mouth every 6 (six) hours as needed. 12/18/14  Yes Debbe Odea, MD  potassium chloride SA (K-DUR,KLOR-CON) 20 MEQ tablet Take 2 tablets (40 mEq total) by mouth 2 (two) times daily. 11/13/14  Yes Rowe Clack, MD  PROAIR HFA 108 (90 BASE) MCG/ACT inhaler INHALE 2 PUFFS BY MOUTH EVERY 6 HOURS AS NEEDED 08/28/14  Yes Rowe Clack, MD  SPIRIVA HANDIHALER 18 MCG inhalation capsule INHALE CONTENTS OF 1 CAPSULE USING HANDIHALER EVERY DAY 12/16/14  Yes Kathee Delton, MD  SYMBICORT 160-4.5 MCG/ACT inhaler INHALE 2 PUFFS BY MOUTH TWICE DAILY 10/19/14  Yes Kathee Delton, MD  tiZANidine (ZANAFLEX) 4 MG tablet TAKE 1 TABLET BY MOUTH EVERY 8 HOURS AS  NEEDED FOR MUSCLE SPASMS 11/10/14  Yes Rowe Clack, MD  torsemide (DEMADEX) 100 MG tablet Take 1 tablet (100 mg total) by mouth 2 (two) times daily. 07/30/14  Yes Biagio Borg, MD  varenicline (CHANTIX) 0.5 MG tablet Take 1 tablet (0.5 mg total) by mouth 2 (two) times daily. 11/03/14  Yes Golden Circle, FNP  venlafaxine XR (EFFEXOR XR) 37.5 MG 24 hr capsule Take 1 capsule (37.5 mg total) by mouth daily with breakfast. 11/13/14  Yes Rowe Clack, MD  hydrOXYzine (ATARAX/VISTARIL) 10 MG tablet TAKE 1 TABLET BY MOUTH THREE TIMES DAILY AS NEEDED FOR ITCHING 08/27/14   Biagio Borg, MD  ibuprofen (ADVIL,MOTRIN) 400 MG tablet Take 1 tablet (400 mg total) by mouth every 6 (six) hours as needed for moderate pain. 12/18/14   Debbe Odea, MD    Physical Exam: Filed Vitals:   12/18/14 2045 12/18/14 2100 12/18/14 2115 12/18/14 2130  BP: 150/81 138/60 153/71 142/70  Pulse: 114 112 110 109  Resp: 21 27 29  34  SpO2: 95% 92% 97% 97%   General: In moderate acute distress, anxious HEENT:       Eyes: PERRL, EOMI, no scleral icterus       ENT: No discharge from the ears and nose, no pharynx injection, no tonsillar enlargement.        Neck: No JVD, no bruit, no mass felt. Cardiac: S1/S2, RRR, No murmurs, No gallops or rubs Pulm: has mild wheezing, no rale or rhonchi or rubs. Abd: Soft, nondistended, nontender, no rebound pain, no organomegaly, BS present Ext: No edema bilaterally. 2+DP/PT pulse bilaterally Musculoskeletal: No joint deformities, erythema, or stiffness, ROM full Skin: No rashes.  Neuro: Alert and oriented X3, cranial nerves II-XII grossly intact, muscle strength 5/5 in all extremeties, sensation to light touch intact.  Psych: Patient is not psychotic, no suicidal or hemocidal ideation.  Labs on Admission:  Basic Metabolic Panel:  Recent Labs Lab 12/14/14 0430 12/15/14 0345 12/16/14 0820 12/17/14 0550 12/18/14 1430 12/18/14 1846  NA 133* 135 136 137  --  130*  K 3.0* 3.6  3.3* 2.9* 4.5 4.9  CL 86* 93* 93*  93*  --  97  CO2 35* 33* 31 31  --  24  GLUCOSE 145* 231* 207* 167*  --  244*  BUN 26* 29* 29* 23  --  14  CREATININE 1.11* 1.18* 1.09 0.80  --  0.96  CALCIUM 8.6 8.3* 9.6 9.0  --  9.0   Liver Function Tests:  Recent Labs Lab 12/18/14 1846  AST 56*  ALT 63*  ALKPHOS 106  BILITOT 2.0*  PROT 6.8  ALBUMIN 3.2*   No results for input(s): LIPASE, AMYLASE in the last 168 hours. No results for input(s): AMMONIA in the last 168 hours. CBC:  Recent Labs Lab 12/15/14 0345 12/15/14 1303 12/16/14 0820 12/17/14 0550 12/18/14 1846  WBC 11.2* 11.2* 9.6 9.2 12.2*  HGB 13.5 13.8 15.1* 14.3 13.8  HCT 41.7 42.1 46.4* 43.9 41.8  MCV 81.1 82.1 81.7 81.6 80.4  PLT 76* 78* 91* 106* 88*   Cardiac Enzymes:  Recent Labs Lab 12/14/14 0921  TROPONINI 0.05*    BNP (last 3 results)  Recent Labs  12/09/14 0330 12/13/14 0820 12/18/14 1846  BNP 31.0 29.7 231.0*    ProBNP (last 3 results)  Recent Labs  02/11/14 0021 04/17/14 0120 07/20/14 1817  PROBNP 32.8 44.7 39.7    CBG:  Recent Labs Lab 12/17/14 1233 12/17/14 1636 12/17/14 2038 12/18/14 0806 12/18/14 1224  GLUCAP 87 194* 157* 229* 171*    Radiological Exams on Admission: Nm Pulmonary Perf And Vent  12/17/2014   CLINICAL DATA:  49 year old female with shortness of breath and chest discomfort. Initial encounter.  EXAM: NUCLEAR MEDICINE VENTILATION - PERFUSION LUNG SCAN  TECHNIQUE: Ventilation images were obtained in multiple projections using inhaled aerosol technetium 99 M DTPA. Perfusion images were obtained in multiple projections after intravenous injection of Tc-2m MAA.  RADIOPHARMACEUTICALS:  44.0 mCi Tc-3m DTPA aerosol and 5 point for mCi Tc-11m MAA  COMPARISON:  Portable chest radiograph 12/16/2014.  FINDINGS: Ventilation: Is heterogeneous bilaterally, in part due to central clumping of the ventilation agent. Scattered bilateral peripheral ventilation defects.  Perfusion:  Scattered bilateral small and peripheral perfusion defects, but appear matched in both lungs.  IMPRESSION: Abnormal scan with matched bilateral pulmonary ventilation and perfusion defects that could reflect chronic obstructed pulmonary disease or chronic pulmonary embolus.  No scintigraphic evidence of acute PE.   Electronically Signed   By: Genevie Ann M.D.   On: 12/17/2014 13:05   Dg Chest Portable 1 View  12/18/2014   CLINICAL DATA:  Shortness of Breath, acute  EXAM: PORTABLE CHEST - 1 VIEW  COMPARISON:  December 16, 2014  FINDINGS: There is interstitial edema superimposed on emphysematous change and bibasilar scarring. Heart is mildly enlarged with pulmonary venous hypertension. No adenopathy. No bone lesions.  IMPRESSION: Congestive heart failure superimposed on emphysematous change. No airspace consolidation.   Electronically Signed   By: Lowella Grip III M.D.   On: 12/18/2014 19:20    EKG: Nonspecific T-wave changes in aVL and V2, tachycardia  Assessment/Plan Principal Problem:   Acute-on-chronic respiratory failure Active Problems:   PULMONARY SARCOIDOSIS   HLD (hyperlipidemia)   Morbid obesity   Anxiety state   Chronic respiratory failure   GERD   Obstructive sleep apnea   Diabetes type 2, uncontrolled   Depression   Chronic diastolic CHF (congestive heart failure)   CHF (congestive heart failure), NYHA class I   SOB (shortness of breath)   COPD with exacerbation  Acute on chronic hypoxemic hypercarbic respiratory failure: It is most likely due  to COPD or asthma exacerbation, more likely to be asthma exacerbation given patient does not have productive cough. Patient has diffused mild wheezing on auscultation. CHF excerbation is less likely given patient does not have any leg edema and BNP 231. Patient was recently treated for community-acquired pneumonia. She completed antibiotic treatment. On this admission, she does not have fever, productive cough or chest pain, making pneumonia  less likely. She had VQ scan yesterday which showed either chronic scarring with her COPD or chronic PE. Due to her lung background change from COPD and CHF, this study was very likely inconclusive. We still need to rule out pulmonary embolism.  -will admit to SDU for prn BiPAP -Nebulizers: scheduled Duoneb and prn Xopenex  -Solu-Medrol 80 mg IV every 8 hour -Mucinex for cough  -Urine legionella and S. pneumococcal antigen -Follow-up CT angiogram to rule out pulmonary embolism, which is ordered by ED -trop x 3 given chest discomfort  Chronic diastolic congestive heart failure: Last transthoracic echocardiogram performed on 01/19/2014 showed an EF of 97-35%, grade 1 diastolic dysfunction. Patient is on high-dose of diuretics, torsemide 100 mg twice a day. CHF is compensated. Patient does not have any leg edema. BNP 231 on admission. -Continue home regimen of diuretics -Continue aspirin  OSA/ OHS - cont Cpap  Poorly controlled insulin dependent diabetes mellitus: A1c was 8.0 on 10/19/14. Patient is on Amaryl and Lantus 30 units twice a day at home. 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 Cause uncertain. May be related to alcohol drinking. No bleeding. Before meals -dropped from 145 to 76-->88 today -Avoid heaprine and placed SCDs -trend platelets by CBC  Alcohol abuse: -CIWA protocol  Chronic Back pain:  -Continue home oxycodone, Voltaren gel, tizanidine -Avoid Tylenol due abnormal liver function  Anxiety: Patient is nervous on admission, but had no suicidal or homicidal ideations. -Continue home Xanax and when necessary Ativan  GERD -Protonix  History of tobacco abuse: Patient quit smoking. Currently on Chantix -Continue Chantix    DVT ppx: SCD  Code Status: Full code Family Communication: None at bed side.    Disposition Plan: Admit to inpatient   Date of Service 12/18/2014    Ivor Costa [facility] Pager [phone]  If 7PM-7AM, please contact night-coverage [email or web address] Password [facility] 12/18/2014, 10:16 PM

## 2014-12-18 NOTE — Progress Notes (Signed)
Letha Cape to be D/C'd Home per MD order.  Discussed prescriptions and follow up appointments with the patient. Prescriptions given to patient, medication list explained in detail. Pt verbalized understanding.    Medication List    STOP taking these medications        CAPSAICIN EX     Insulin Pen Needle 31G X 8 MM Misc     naproxen sodium 220 MG tablet  Commonly known as:  ANAPROX      TAKE these medications        acetaminophen 325 MG tablet  Commonly known as:  TYLENOL  Take 1,300 mg by mouth every 6 (six) hours as needed.     ALPRAZolam 1 MG tablet  Commonly known as:  XANAX  Take 1 mg by mouth 3 (three) times daily.     aspirin EC 81 MG tablet  Take 81 mg by mouth daily.     BIOTIN PO  Take 1 capsule by mouth daily.     cetirizine 1 MG/ML syrup  Commonly known as:  ZYRTEC  TAKE 10 ML BY MOUTH EVERY NIGHT AT BEDTIME     Co Q 10 10 MG Caps  Take 1 capsule by mouth daily.     diclofenac sodium 1 % Gel  Commonly known as:  VOLTAREN  Apply 4 g topically 4 (four) times daily.     esomeprazole 40 MG capsule  Commonly known as:  NEXIUM  Take 40 mg by mouth 2 (two) times daily before a meal.     fluticasone 50 MCG/ACT nasal spray  Commonly known as:  FLONASE  Place 2 sprays into both nostrils daily.     gabapentin 300 MG capsule  Commonly known as:  NEURONTIN  Take 300 mg by mouth at bedtime.     glimepiride 1 MG tablet  Commonly known as:  AMARYL  Take 1 mg by mouth daily with breakfast.     guaiFENesin 600 MG 12 hr tablet  Commonly known as:  MUCINEX  Take 2 tablets (1,200 mg total) by mouth 2 (two) times daily as needed for to loosen phlegm.     hydrOXYzine 10 MG tablet  Commonly known as:  ATARAX/VISTARIL  TAKE 1 TABLET BY MOUTH THREE TIMES DAILY AS NEEDED FOR ITCHING     ibuprofen 400 MG tablet  Commonly known as:  ADVIL,MOTRIN  Take 1 tablet (400 mg total) by mouth every 6 (six) hours as needed for moderate pain.     insulin aspart 100  UNIT/ML FlexPen  Commonly known as:  NOVOLOG FLEXPEN  Use as directed per sliding scale indicated by your physician.     Insulin Glargine 100 UNIT/ML Solostar Pen  Commonly known as:  LANTUS SOLOSTAR  Inject 30 Units into the skin 2 (two) times daily.     multivitamin capsule  Take 1 capsule by mouth daily.     omega-3 fish oil 1000 MG Caps capsule  Commonly known as:  MAXEPA  Take 1 capsule by mouth daily.     ONE TOUCH ULTRA TEST test strip  Generic drug:  glucose blood     Oxycodone HCl 10 MG Tabs  Take 1 tablet (10 mg total) by mouth every 6 (six) hours as needed.     potassium chloride SA 20 MEQ tablet  Commonly known as:  K-DUR,KLOR-CON  Take 2 tablets (40 mEq total) by mouth 2 (two) times daily.     PROAIR HFA 108 (90 BASE) MCG/ACT inhaler  Generic drug:  albuterol  INHALE 2 PUFFS BY MOUTH EVERY 6 HOURS AS NEEDED     albuterol (2.5 MG/3ML) 0.083% nebulizer solution  Commonly known as:  PROVENTIL  Take 3 mLs (2.5 mg total) by nebulization every 6 (six) hours as needed for wheezing or shortness of breath.     SPIRIVA HANDIHALER 18 MCG inhalation capsule  Generic drug:  tiotropium  INHALE CONTENTS OF 1 CAPSULE USING HANDIHALER EVERY DAY     SYMBICORT 160-4.5 MCG/ACT inhaler  Generic drug:  budesonide-formoterol  INHALE 2 PUFFS BY MOUTH TWICE DAILY     tiZANidine 4 MG tablet  Commonly known as:  ZANAFLEX  TAKE 1 TABLET BY MOUTH EVERY 8 HOURS AS NEEDED FOR MUSCLE SPASMS     torsemide 100 MG tablet  Commonly known as:  DEMADEX  Take 1 tablet (100 mg total) by mouth 2 (two) times daily.     varenicline 0.5 MG tablet  Commonly known as:  CHANTIX  Take 1 tablet (0.5 mg total) by mouth 2 (two) times daily.     venlafaxine XR 37.5 MG 24 hr capsule  Commonly known as:  EFFEXOR XR  Take 1 capsule (37.5 mg total) by mouth daily with breakfast.        Filed Vitals:   12/18/14 0531  BP: 141/79  Pulse: 96  Temp: 98.4 F (36.9 C)  Resp: 20    Skin clean, dry  and intact without evidence of skin break down, no evidence of skin tears noted. IV catheter discontinued intact. Site without signs and symptoms of complications. Dressing and pressure applied. Pt denies pain at this time. No complaints noted.  An After Visit Summary was printed and given to the patient. Patient escorted via [place], and D/C home via private auto.  Lolita Rieger 12/18/2014 3:46 PM

## 2014-12-18 NOTE — Progress Notes (Signed)
Patient's PCP and [facility] offices closed for the holiday, not able to schedule follow up appointment for patient. Will notify patient of having to schedule her own appointment and educate her on the importance of scheduling a follow up appointment. -Nonie Hoyer, RN

## 2014-12-19 DIAGNOSIS — J438 Other emphysema: Secondary | ICD-10-CM

## 2014-12-19 DIAGNOSIS — R918 Other nonspecific abnormal finding of lung field: Secondary | ICD-10-CM

## 2014-12-19 LAB — COMPREHENSIVE METABOLIC PANEL
ALT: 57 U/L — ABNORMAL HIGH (ref 0–35)
AST: 30 U/L (ref 0–37)
Albumin: 3.3 g/dL — ABNORMAL LOW (ref 3.5–5.2)
Alkaline Phosphatase: 96 U/L (ref 39–117)
Anion gap: 9 (ref 5–15)
BUN: 18 mg/dL (ref 6–23)
CO2: 24 mmol/L (ref 19–32)
CREATININE: 1.07 mg/dL (ref 0.50–1.10)
Calcium: 8.7 mg/dL (ref 8.4–10.5)
Chloride: 99 mmol/L (ref 96–112)
GFR calc Af Amer: 70 mL/min — ABNORMAL LOW (ref >90)
GFR, EST NON AFRICAN AMERICAN: 61 mL/min — AB (ref >90)
Glucose, Bld: 373 mg/dL — ABNORMAL HIGH (ref 70–99)
Potassium: 5.8 mmol/L — ABNORMAL HIGH (ref 3.5–5.1)
Sodium: 132 mmol/L — ABNORMAL LOW (ref 135–145)
Total Bilirubin: 1.2 mg/dL (ref 0.3–1.2)
Total Protein: 6.9 g/dL (ref 6.0–8.3)

## 2014-12-19 LAB — PROTIME-INR
INR: 1.03 (ref 0.00–1.49)
Prothrombin Time: 13.7 seconds (ref 11.6–15.2)

## 2014-12-19 LAB — TROPONIN I
TROPONIN I: 0.05 ng/mL — AB (ref <0.031)
Troponin I: <0.03 ng/mL (ref <0.031)
Troponin I: 0.06 ng/mL — ABNORMAL HIGH (ref <0.031)

## 2014-12-19 LAB — GLUCOSE, CAPILLARY
GLUCOSE-CAPILLARY: 215 mg/dL — AB (ref 70–99)
Glucose-Capillary: 316 mg/dL — ABNORMAL HIGH (ref 70–99)
Glucose-Capillary: 290 mg/dL — ABNORMAL HIGH (ref 70–99)
Glucose-Capillary: 195 mg/dL — ABNORMAL HIGH (ref 70–99)

## 2014-12-19 LAB — STREP PNEUMONIAE URINARY ANTIGEN: STREP PNEUMO URINARY ANTIGEN: NEGATIVE

## 2014-12-19 MED ORDER — VARENICLINE TARTRATE 0.5 MG PO TABS
0.5000 mg | ORAL_TABLET | Freq: Two times a day (BID) | ORAL | Status: DC
  Administered 2014-12-19 – 2014-12-28 (×19): 0.5 mg via ORAL
  Filled 2014-12-19 (×23): qty 1

## 2014-12-19 MED ORDER — LORATADINE 10 MG PO TABS
10.0000 mg | ORAL_TABLET | Freq: Every day | ORAL | Status: DC
  Administered 2014-12-19 – 2014-12-28 (×10): 10 mg via ORAL
  Filled 2014-12-19 (×10): qty 1

## 2014-12-19 MED ORDER — TIZANIDINE HCL 4 MG PO TABS
4.0000 mg | ORAL_TABLET | Freq: Three times a day (TID) | ORAL | Status: DC | PRN
  Administered 2014-12-19: 4 mg via ORAL
  Administered 2014-12-24: 2 mg via ORAL
  Administered 2014-12-28: 4 mg via ORAL
  Filled 2014-12-19 (×7): qty 1

## 2014-12-19 MED ORDER — THIAMINE HCL 100 MG/ML IJ SOLN
100.0000 mg | Freq: Every day | INTRAMUSCULAR | Status: DC
  Filled 2014-12-19 (×3): qty 1

## 2014-12-19 MED ORDER — ALPRAZOLAM 0.5 MG PO TABS
0.5000 mg | ORAL_TABLET | Freq: Two times a day (BID) | ORAL | Status: DC
Start: 2014-12-19 — End: 2014-12-21
  Administered 2014-12-19 – 2014-12-20 (×3): 0.5 mg via ORAL
  Filled 2014-12-19 (×3): qty 1

## 2014-12-19 MED ORDER — CHLORHEXIDINE GLUCONATE 0.12 % MT SOLN
15.0000 mL | Freq: Two times a day (BID) | OROMUCOSAL | Status: DC
  Administered 2014-12-19 – 2014-12-20 (×4): 15 mL via OROMUCOSAL
  Filled 2014-12-19 (×7): qty 15

## 2014-12-19 MED ORDER — ASPIRIN EC 81 MG PO TBEC
81.0000 mg | DELAYED_RELEASE_TABLET | Freq: Every day | ORAL | Status: DC
  Administered 2014-12-19 – 2014-12-28 (×10): 81 mg via ORAL
  Filled 2014-12-19 (×10): qty 1

## 2014-12-19 MED ORDER — HYDROXYZINE HCL 10 MG PO TABS
10.0000 mg | ORAL_TABLET | Freq: Four times a day (QID) | ORAL | Status: DC | PRN
  Filled 2014-12-19 (×2): qty 1

## 2014-12-19 MED ORDER — IPRATROPIUM-ALBUTEROL 0.5-2.5 (3) MG/3ML IN SOLN
3.0000 mL | RESPIRATORY_TRACT | Status: DC
  Administered 2014-12-19 – 2014-12-20 (×7): 3 mL via RESPIRATORY_TRACT
  Filled 2014-12-19 (×10): qty 3

## 2014-12-19 MED ORDER — CETYLPYRIDINIUM CHLORIDE 0.05 % MT LIQD
7.0000 mL | Freq: Two times a day (BID) | OROMUCOSAL | Status: DC
  Administered 2014-12-20 – 2014-12-28 (×9): 7 mL via OROMUCOSAL

## 2014-12-19 MED ORDER — MULTIVITAMINS PO CAPS: 1.0000 | ORAL_CAPSULE | Freq: Every day | ORAL | Status: DC

## 2014-12-19 MED ORDER — SODIUM POLYSTYRENE SULFONATE 15 GM/60ML PO SUSP
30.0000 g | Freq: Once | ORAL | Status: AC
  Administered 2014-12-19: 30 g via ORAL
  Filled 2014-12-19: qty 120

## 2014-12-19 MED ORDER — SODIUM CHLORIDE 0.9 % IJ SOLN
3.0000 mL | Freq: Two times a day (BID) | INTRAMUSCULAR | Status: DC
  Administered 2014-12-19 – 2014-12-27 (×11): 3 mL via INTRAVENOUS

## 2014-12-19 MED ORDER — GUAIFENESIN ER 600 MG PO TB12
1200.0000 mg | ORAL_TABLET | Freq: Two times a day (BID) | ORAL | Status: DC | PRN
  Filled 2014-12-19: qty 2

## 2014-12-19 MED ORDER — OXYCODONE HCL 5 MG PO TABS
5.0000 mg | ORAL_TABLET | Freq: Once | ORAL | Status: AC
  Administered 2014-12-19: 5 mg via ORAL
  Filled 2014-12-19: qty 1

## 2014-12-19 MED ORDER — LORAZEPAM 2 MG/ML IJ SOLN: 0.0000 mg | Freq: Two times a day (BID) | INTRAMUSCULAR | Status: DC

## 2014-12-19 MED ORDER — DM-GUAIFENESIN ER 30-600 MG PO TB12
1.0000 | ORAL_TABLET | Freq: Two times a day (BID) | ORAL | Status: DC
  Administered 2014-12-19 – 2014-12-28 (×20): 1 via ORAL
  Filled 2014-12-19 (×22): qty 1

## 2014-12-19 MED ORDER — LORAZEPAM 1 MG PO TABS
1.0000 mg | ORAL_TABLET | Freq: Four times a day (QID) | ORAL | Status: DC | PRN
  Administered 2014-12-20 – 2014-12-21 (×3): 1 mg via ORAL
  Filled 2014-12-19 (×5): qty 2

## 2014-12-19 MED ORDER — TORSEMIDE 100 MG PO TABS
100.0000 mg | ORAL_TABLET | Freq: Two times a day (BID) | ORAL | Status: DC
  Administered 2014-12-19 – 2014-12-21 (×5): 100 mg via ORAL
  Filled 2014-12-19 (×7): qty 1

## 2014-12-19 MED ORDER — FOLIC ACID 1 MG PO TABS
1.0000 mg | ORAL_TABLET | Freq: Every day | ORAL | Status: DC
  Administered 2014-12-19 – 2014-12-28 (×10): 1 mg via ORAL
  Filled 2014-12-19 (×10): qty 1

## 2014-12-19 MED ORDER — IBUPROFEN 200 MG PO TABS: 400.0000 mg | ORAL_TABLET | Freq: Four times a day (QID) | ORAL | Status: DC | PRN

## 2014-12-19 MED ORDER — PANTOPRAZOLE SODIUM 40 MG PO TBEC
40.0000 mg | DELAYED_RELEASE_TABLET | Freq: Every day | ORAL | Status: DC
Start: 2014-12-19 — End: 2014-12-28
  Administered 2014-12-19 – 2014-12-28 (×10): 40 mg via ORAL
  Filled 2014-12-19 (×10): qty 1

## 2014-12-19 MED ORDER — METHYLPREDNISOLONE SODIUM SUCC 125 MG IJ SOLR
80.0000 mg | Freq: Three times a day (TID) | INTRAMUSCULAR | Status: DC
  Administered 2014-12-19: 80 mg via INTRAVENOUS
  Filled 2014-12-19 (×3): qty 1.28

## 2014-12-19 MED ORDER — ALPRAZOLAM 0.5 MG PO TABS
1.0000 mg | ORAL_TABLET | Freq: Three times a day (TID) | ORAL | Status: DC
  Administered 2014-12-19: 1 mg via ORAL
  Filled 2014-12-19: qty 2

## 2014-12-19 MED ORDER — ADULT MULTIVITAMIN W/MINERALS CH
1.0000 | ORAL_TABLET | Freq: Every day | ORAL | Status: DC
  Administered 2014-12-19 – 2014-12-28 (×10): 1 via ORAL
  Filled 2014-12-19 (×10): qty 1

## 2014-12-19 MED ORDER — CO Q 10 10 MG PO CAPS: 1.0000 | ORAL_CAPSULE | Freq: Every day | ORAL | Status: DC

## 2014-12-19 MED ORDER — LORAZEPAM 2 MG/ML IJ SOLN
1.0000 mg | Freq: Four times a day (QID) | INTRAMUSCULAR | Status: DC | PRN
  Administered 2014-12-22: 1 mg via INTRAVENOUS
  Filled 2014-12-19: qty 1

## 2014-12-19 MED ORDER — LEVALBUTEROL HCL 1.25 MG/0.5ML IN NEBU
1.2500 mg | INHALATION_SOLUTION | Freq: Four times a day (QID) | RESPIRATORY_TRACT | Status: DC | PRN
  Administered 2014-12-22: 1.25 mg via RESPIRATORY_TRACT
  Filled 2014-12-19 (×2): qty 0.5

## 2014-12-19 MED ORDER — BIOTIN 5 MG PO CAPS: 1.0000 | ORAL_CAPSULE | Freq: Every day | ORAL | Status: DC

## 2014-12-19 MED ORDER — VITAMIN B-1 100 MG PO TABS
100.0000 mg | ORAL_TABLET | Freq: Every day | ORAL | Status: DC
  Administered 2014-12-19 – 2014-12-28 (×10): 100 mg via ORAL
  Filled 2014-12-19 (×10): qty 1

## 2014-12-19 MED ORDER — GABAPENTIN 300 MG PO CAPS
300.0000 mg | ORAL_CAPSULE | Freq: Every day | ORAL | Status: DC
  Administered 2014-12-19 – 2014-12-27 (×10): 300 mg via ORAL
  Filled 2014-12-19 (×11): qty 1

## 2014-12-19 MED ORDER — FLUTICASONE PROPIONATE 50 MCG/ACT NA SUSP
2.0000 | Freq: Every day | NASAL | Status: DC
  Administered 2014-12-19 – 2014-12-28 (×9): 2 via NASAL
  Filled 2014-12-19: qty 16

## 2014-12-19 MED ORDER — LORAZEPAM 2 MG/ML IJ SOLN: 0.0000 mg | Freq: Four times a day (QID) | INTRAMUSCULAR | Status: AC

## 2014-12-19 MED ORDER — INSULIN GLARGINE 100 UNIT/ML ~~LOC~~ SOLN
20.0000 [IU] | Freq: Two times a day (BID) | SUBCUTANEOUS | Status: DC
  Administered 2014-12-19 – 2014-12-22 (×6): 20 [IU] via SUBCUTANEOUS
  Filled 2014-12-19 (×10): qty 0.2

## 2014-12-19 MED ORDER — OXYCODONE HCL 5 MG PO TABS
10.0000 mg | ORAL_TABLET | Freq: Four times a day (QID) | ORAL | Status: DC | PRN
  Administered 2014-12-19 – 2014-12-21 (×9): 10 mg via ORAL
  Filled 2014-12-19 (×9): qty 2

## 2014-12-19 MED ORDER — VENLAFAXINE HCL ER 37.5 MG PO CP24
37.5000 mg | ORAL_CAPSULE | Freq: Every day | ORAL | Status: DC
  Administered 2014-12-19 – 2014-12-28 (×10): 37.5 mg via ORAL
  Filled 2014-12-19 (×13): qty 1

## 2014-12-19 MED ORDER — DICLOFENAC SODIUM 1 % TD GEL
4.0000 g | Freq: Four times a day (QID) | TRANSDERMAL | Status: DC
  Administered 2014-12-19 – 2014-12-27 (×19): 4 g via TOPICAL
  Filled 2014-12-19: qty 100

## 2014-12-19 MED ORDER — ONDANSETRON HCL 4 MG PO TABS: 4.0000 mg | ORAL_TABLET | Freq: Four times a day (QID) | ORAL | Status: DC | PRN

## 2014-12-19 MED ORDER — LORAZEPAM 2 MG/ML IJ SOLN: 1.0000 mg | Freq: Four times a day (QID) | INTRAMUSCULAR | Status: DC | PRN

## 2014-12-19 MED ORDER — OMEGA-3-ACID ETHYL ESTERS 1 G PO CAPS
1.0000 | ORAL_CAPSULE | Freq: Every day | ORAL | Status: DC
  Administered 2014-12-19 – 2014-12-28 (×10): 1 g via ORAL
  Filled 2014-12-19 (×11): qty 1

## 2014-12-19 MED ORDER — FUROSEMIDE 10 MG/ML IJ SOLN
60.0000 mg | Freq: Three times a day (TID) | INTRAMUSCULAR | Status: AC
  Administered 2014-12-19 – 2014-12-20 (×2): 60 mg via INTRAVENOUS
  Filled 2014-12-19 (×2): qty 6

## 2014-12-19 MED ORDER — INSULIN ASPART 100 UNIT/ML ~~LOC~~ SOLN
0.0000 [IU] | Freq: Three times a day (TID) | SUBCUTANEOUS | Status: DC
  Administered 2014-12-19: 8 [IU] via SUBCUTANEOUS
  Administered 2014-12-19: 5 [IU] via SUBCUTANEOUS
  Administered 2014-12-20 – 2014-12-21 (×5): 3 [IU] via SUBCUTANEOUS
  Administered 2014-12-22: 11 [IU] via SUBCUTANEOUS
  Administered 2014-12-22 (×2): 8 [IU] via SUBCUTANEOUS
  Administered 2014-12-23: 11 [IU] via SUBCUTANEOUS
  Administered 2014-12-23: 8 [IU] via SUBCUTANEOUS
  Administered 2014-12-23: 11 [IU] via SUBCUTANEOUS
  Administered 2014-12-24: 8 [IU] via SUBCUTANEOUS
  Administered 2014-12-24: 11 [IU] via SUBCUTANEOUS
  Administered 2014-12-24: 5 [IU] via SUBCUTANEOUS
  Administered 2014-12-25: 11 [IU] via SUBCUTANEOUS
  Administered 2014-12-25: 5 [IU] via SUBCUTANEOUS
  Administered 2014-12-25: 3 [IU] via SUBCUTANEOUS
  Administered 2014-12-26: 5 [IU] via SUBCUTANEOUS
  Administered 2014-12-26 – 2014-12-27 (×2): 8 [IU] via SUBCUTANEOUS
  Administered 2014-12-28: 3 [IU] via SUBCUTANEOUS

## 2014-12-19 MED ORDER — ONDANSETRON HCL 4 MG/2ML IJ SOLN
4.0000 mg | Freq: Four times a day (QID) | INTRAMUSCULAR | Status: DC | PRN
  Administered 2014-12-21 – 2014-12-27 (×10): 4 mg via INTRAVENOUS
  Filled 2014-12-19 (×10): qty 2

## 2014-12-19 MED ORDER — INSULIN ASPART 100 UNIT/ML ~~LOC~~ SOLN
0.0000 [IU] | Freq: Every day | SUBCUTANEOUS | Status: DC
  Administered 2014-12-21: 5 [IU] via SUBCUTANEOUS
  Administered 2014-12-22: 3 [IU] via SUBCUTANEOUS
  Administered 2014-12-23: 4 [IU] via SUBCUTANEOUS
  Administered 2014-12-24: 3 [IU] via SUBCUTANEOUS
  Administered 2014-12-25 – 2014-12-27 (×3): 2 [IU] via SUBCUTANEOUS

## 2014-12-19 NOTE — Progress Notes (Signed)
[name] is complaining about being hot and thirsty.  She has pulled her gown off and her IV out trying to "get cool".  She is oriented but very impulsive and at time unable to reason with.  We have moved her from a 100% non-rebreather  mask to a 55% venti mask with sats remaining in the mid 90's and she does not seem to be in respiratory distress at this time.  Her main and overriding concern is being able to eat ice chips.  She is becoming panicked about being NPO.  I will call Fredirick Maudlin NP for an order now that her breathing is stable and before she has a panic attack.

## 2014-12-19 NOTE — Progress Notes (Addendum)
Patient Demographics  [name], is a 49 y.o. female, DOB - 1966-09-19, BTD:[id number]  Admit date - 12/18/2014   Admitting Physician Ivor Costa, MD  Outpatient Primary MD for the patient is Birdie Riddle, MD  LOS - 1   Chief Complaint  Patient presents with  . Shortness of Breath        Subjective:   [name] today has, No headache, No chest pain, No abdominal pain - No Nausea, No new weakness tingling or numbness, No Cough - Improved SOB.    Assessment & Plan    1. Acute on chronic respiratory failure. On home oxygen, history of advanced COPD, obesity hypoventilation syndrome, obstructive sleep apnea along with chronic diastolic CHF with EF 36%.  Discussed with her primary pulmonologist Dr. [name] who saw the patient in the hospital today as well, plan is to aggressively diurese her for now, avoid any steroids, oxygen and nebulizer supplementation as needed. He has ordered some additional blood work to rule out sarcoidosis. She has been counseled to quit smoking. Continue daily at bedtime  CPAP for sleep apnea. Reevaluate in the morning.   2.DM2 - was elevated secondary to steroids she received in the ER, stop steroids. Continue Lantus, added sliding scale. Will monitor CBGs.  Lab Results  Component Value Date   HGBA1C 8.0* 10/19/2014    CBG (last 3)   Recent Labs  12/18/14 0806 12/18/14 1224 12/19/14 0831  GLUCAP 229* 171* 316*     3. GERD. Continue PPI.   4. Hyperkalemia. Diurese, Kayexalate. Recheck in the morning.   5. History of smoking. Counseled to quit.   6. Thrombocytopenia. HRT was checked recently negative, no evidence of bleeding. We will monitor.   7. Alcohol abuse. CIWA protocol.   8. Anxiety. As needed Xanax.      Code Status:  Full  Family Communication: None  Disposition Plan:  Home   Procedures   CTA Chest - no PE, diffuse groundglass pattern  TTE April 2015  - Left ventricle: The cavity size was normal. There was moderate concentric hypertrophy. Systolic function wasnormal. The estimated ejection fraction was in the range of 55% to 60%. Although no diagnostic regional wall motionabnormality was identified, this possibility cannot becompletely excluded on the basis of this study. Doppler parameters are consistent with abnormal left ventricularrelaxation (grade 1 diastolic dysfunction). - Pulmonary arteries: PA peak pressure: 53mm Hg (S).   Consults  PCCM   Medications  Scheduled Meds: . ALPRAZolam  0.5 mg Oral BID  . antiseptic oral rinse  7 mL Mouth Rinse q12n4p  . aspirin EC  81 mg Oral Daily  . chlorhexidine  15 mL Mouth Rinse BID  . dextromethorphan-guaiFENesin  1 tablet Oral BID  . diclofenac sodium  4 g Topical QID  . fluticasone  2 spray Each Nare Daily  . folic acid  1 mg Oral Daily  . furosemide  60 mg Intravenous 3 times per day  . gabapentin  300 mg Oral QHS  . insulin aspart  0-15 Units Subcutaneous TID WC  . insulin aspart  0-5 Units Subcutaneous QHS  . insulin glargine  20 Units Subcutaneous BID  . ipratropium-albuterol  3 mL Nebulization Q4H  . loratadine  10 mg Oral Daily  .  LORazepam  0-4 mg Intravenous 4 times per day   Followed by  . [START ON 12/21/2014] LORazepam  0-4 mg Intravenous Q12H  . multivitamin with minerals  1 tablet Oral Daily  . omega-3 acid ethyl esters  1 capsule Oral Daily  . pantoprazole  40 mg Oral Daily  . sodium chloride  3 mL Intravenous Q12H  . thiamine  100 mg Oral Daily   Or  . thiamine  100 mg Intravenous Daily  . torsemide  100 mg Oral BID  . varenicline  0.5 mg Oral BID WC  . venlafaxine XR  37.5 mg Oral Q breakfast   Continuous Infusions:  PRN Meds:.guaiFENesin, hydrOXYzine, levalbuterol, LORazepam, LORazepam **OR** LORazepam,  ondansetron **OR** ondansetron (ZOFRAN) IV, oxyCODONE, tiZANidine  DVT Prophylaxis  SCD due to thrombocytopenia  Lab Results  Component Value Date   PLT 88* 12/18/2014    Antibiotics     Anti-infectives    None          Objective:   Filed Vitals:   12/19/14 0415 12/19/14 0600 12/19/14 0837 12/19/14 1141  BP:  121/56 145/107   Pulse: 90  86   Resp: 20  17   Height:      Weight:      SpO2: 95%  95% 99%    Wt Readings from Last 3 Encounters:  12/19/14 136.986 kg (302 lb)  12/06/14 134.265 kg (296 lb)  11/18/14 137.077 kg (302 lb 3.2 oz)     Intake/Output Summary (Last 24 hours) at 12/19/14 1233 Last data filed at 12/19/14 0945  Gross per 24 hour  Intake      3 ml  Output      0 ml  Net      3 ml     Physical Exam  Awake Alert, Oriented X 3, No new F.N deficits, Normal affect [place].AT,PERRAL Supple Neck,No JVD, No cervical lymphadenopathy appriciated.  Symmetrical Chest wall movement, Good air movement bilaterally, diffuse bilateral crackles RRR,No Gallops,Rubs or new Murmurs, No Parasternal Heave +ve B.Sounds, Abd Soft, No tenderness, No organomegaly appriciated, No rebound - guarding or rigidity. No Cyanosis, Clubbing or edema, No new Rash or bruise     Data Review   Micro Results Recent Results (from the past 240 hour(s))  MRSA PCR Screening     Status: Abnormal   Collection Time: 12/14/14  9:30 AM  Result Value Ref Range Status   MRSA by PCR POSITIVE (A) NEGATIVE Final    Comment:        The GeneXpert MRSA Assay (FDA approved for NASAL specimens only), is one component of a comprehensive MRSA colonization surveillance program. It is not intended to diagnose MRSA infection nor to guide or monitor treatment for MRSA infections. RESULT CALLED TO, READ BACK BY AND VERIFIED WITH: MAIN,S @ 7322 ON [id number] BY POTEAT,S     Radiology Reports Dg Chest 2 View  12/13/2014   CLINICAL DATA:  Weakness and shortness of Breath  EXAM: CHEST  2 VIEW   COMPARISON:  12/11/2014  FINDINGS: Cardiac shadow is mildly enlarged. Patchy changes are noted in the bases bilaterally stable from the prior exam. No new focal infiltrate is seen. No sizable effusion is noted.  IMPRESSION: Patchy bibasilar changes.  No acute abnormality is noted.   Electronically Signed   By: [name] M.D.   On: 12/13/2014 11:07   Dg Chest 2 View  12/11/2014   CLINICAL DATA:  Short of breath.  History pneumonia.  EXAM: CHEST  2 VIEW  COMPARISON:  12/09/2014  FINDINGS: Chronic lung disease with hyperinflation and bibasilar scarring. Prominent lung markings in the bases are stable with prior studies  Cardiac enlargement with pulmonary vascular congestion. Findings are consistent with mild fluid overload without edema or effusion. No definite pneumonia.  IMPRESSION: Chronic lung disease bibasilar scarring which appears stable  Cardiac enlargement with mild vascular congestion suggesting mild fluid overload.  No change from the prior study.   Electronically Signed   By: Franchot Gallo M.D.   On: 12/11/2014 10:36   Dg Chest 2 View (if Patient Has Fever And/or Copd)  12/09/2014   CLINICAL DATA:  Acute onset of severe shortness of breath. Initial encounter.  EXAM: CHEST  2 VIEW  COMPARISON:  Chest radiograph performed 10/31/2014  FINDINGS: The lungs are well-aerated. Vascular congestion is noted. Bilateral central airspace opacities raise concern for pulmonary edema, superimposed on chronic lung changes as previously noted. Mild pneumonia might have a similar appearance. There is no evidence of pleural effusion or pneumothorax.  The heart is borderline normal in size. No acute osseous abnormalities are seen.  IMPRESSION: Vascular congestion noted. Bilateral central airspace opacities raise concern for mild pulmonary edema, superimposed on chronic lung changes. Mild pneumonia might have a similar appearance.   Electronically Signed   By: Garald Balding M.D.   On: 12/09/2014 04:22   Ct Angio  Chest Pe W/cm &/or Wo Cm  12/18/2014   CLINICAL DATA:  Shortness breath, chest pain. History of sarcoidosis, COPD.  EXAM: CT ANGIOGRAPHY CHEST WITH CONTRAST  TECHNIQUE: Multidetector CT imaging of the chest was performed using the standard protocol during bolus administration of intravenous contrast. Multiplanar CT image reconstructions and MIPs were obtained to evaluate the vascular anatomy.  CONTRAST:  31mL OMNIPAQUE IOHEXOL 350 MG/ML SOLN  COMPARISON:  08/04/2013  FINDINGS: No filling defects in the pulmonary arteries to suggest pulmonary emboli. The distal vessels are not well visualized due to respiratory motion. Mild cardiomegaly. Aorta is normal caliber. Pulmonary artery remains dilated measuring 36 mm compared to 38 mm previously. Central pulmonary arteries appear prominent. Findings suggest pulmonary arterial hypertension.  Mild emphysema/COPD. Diffuse ground-glass opacities throughout the lungs are stable since prior study. No pleural effusions. Chronic bibasilar densities likely reflect scarring. No pleural effusions.  Small scattered borderline sized mediastinal and bilateral hilar lymph nodes, similar to prior study. No axillary adenopathy.  Chest wall soft tissues are unremarkable. Imaging into the upper abdomen demonstrates diffuse fatty infiltration of the liver. No acute findings.  No acute bony abnormality or focal bone lesion.  Review of the MIP images confirms the above findings.  IMPRESSION: No evidence of pulmonary embolus. The distal peripheral vessels are not well visualized due to respiratory motion.  Borderline heart size. Stable dilatation of the pulmonary arteries compatible with pulmonary arterial hypertension.  Underline COPD. Stable diffuse ground-glass opacities throughout the lungs and bibasilar scarring.   Electronically Signed   By: Rolm Baptise M.D.   On: 12/18/2014 23:55   Nm Pulmonary Perf And Vent  12/17/2014   CLINICAL DATA:  49 year old female with shortness of breath and  chest discomfort. Initial encounter.  EXAM: NUCLEAR MEDICINE VENTILATION - PERFUSION LUNG SCAN  TECHNIQUE: Ventilation images were obtained in multiple projections using inhaled aerosol technetium 99 M DTPA. Perfusion images were obtained in multiple projections after intravenous injection of Tc-18m MAA.  RADIOPHARMACEUTICALS:  44.0 mCi Tc-35m DTPA aerosol and 5 point for mCi Tc-28m MAA  COMPARISON:  Portable chest radiograph 12/16/2014.  FINDINGS:  Ventilation: Is heterogeneous bilaterally, in part due to central clumping of the ventilation agent. Scattered bilateral peripheral ventilation defects.  Perfusion: Scattered bilateral small and peripheral perfusion defects, but appear matched in both lungs.  IMPRESSION: Abnormal scan with matched bilateral pulmonary ventilation and perfusion defects that could reflect chronic obstructed pulmonary disease or chronic pulmonary embolus.  No scintigraphic evidence of acute PE.   Electronically Signed   By: Genevie Ann M.D.   On: 12/17/2014 13:05   Dg Chest Portable 1 View  12/18/2014   CLINICAL DATA:  Shortness of Breath, acute  EXAM: PORTABLE CHEST - 1 VIEW  COMPARISON:  December 16, 2014  FINDINGS: There is interstitial edema superimposed on emphysematous change and bibasilar scarring. Heart is mildly enlarged with pulmonary venous hypertension. No adenopathy. No bone lesions.  IMPRESSION: Congestive heart failure superimposed on emphysematous change. No airspace consolidation.   Electronically Signed   By: Lowella Grip III M.D.   On: 12/18/2014 19:20   Dg Chest Port 1 View  12/16/2014   CLINICAL DATA:  Shortness of breath, respiratory failure, CHF and pulmonary infiltrates.  EXAM: PORTABLE CHEST - 1 VIEW  COMPARISON:  12/14/2014  FINDINGS: Bibasilar opacities, right greater than left are favored to largely represent chronic areas of scarring and atelectasis when reviewing prior chest x-rays. No progressive pulmonary airspace consolidation is identified. There is no  evidence of overt pulmonary edema or pleural fluid. No pneumothorax is identified. The heart size is stable and normal.  IMPRESSION: Remaining bibasilar opacities, right greater than left, are favored to represent chronic areas of scarring and atelectasis.   Electronically Signed   By: Aletta Edouard M.D.   On: 12/16/2014 11:38   Dg Chest Port 1 View  12/14/2014   CLINICAL DATA:  Shortness of breath and chest pain  EXAM: PORTABLE CHEST - 1 VIEW  COMPARISON:  12/13/2014  FINDINGS: Cardiac shadow remains enlarged. Patchy infiltrate is noted in the right lung base. Some diffuse interstitial changes are noted bilaterally which may be related to superimposed congestive failure. This is more prominent than that seen on the prior exam.  IMPRESSION: Stable patchy right basilar infiltrate.  Increasing vascular congestion and interstitial edema.   Electronically Signed   By: [name] M.D.   On: 12/14/2014 10:57     CBC  Recent Labs Lab 12/15/14 0345 12/15/14 1303 12/16/14 0820 12/17/14 0550 12/18/14 1846  WBC 11.2* 11.2* 9.6 9.2 12.2*  HGB 13.5 13.8 15.1* 14.3 13.8  HCT 41.7 42.1 46.4* 43.9 41.8  PLT 76* 78* 91* 106* 88*  MCV 81.1 82.1 81.7 81.6 80.4  MCH 26.3 26.9 26.6 26.6 26.5  MCHC 32.4 32.8 32.5 32.6 33.0  RDW 19.5* 19.7* 19.8* 19.5* 19.6*    Chemistries   Recent Labs Lab 12/15/14 0345 12/16/14 0820 12/17/14 0550 12/18/14 1430 12/18/14 1846 12/19/14 0700  NA 135 136 137  --  130* 132*  K 3.6 3.3* 2.9* 4.5 4.9 5.8*  CL 93* 93* 93*  --  97 99  CO2 33* 31 31  --  24 24  GLUCOSE 231* 207* 167*  --  244* 373*  BUN 29* 29* 23  --  14 18  CREATININE 1.18* 1.09 0.80  --  0.96 1.07  CALCIUM 8.3* 9.6 9.0  --  9.0 8.7  AST  --   --   --   --  56* 30  ALT  --   --   --   --  63* 57*  ALKPHOS  --   --   --   --  106 96  BILITOT  --   --   --   --  2.0* 1.2   ------------------------------------------------------------------------------------------------------------------ estimated  creatinine clearance is 95.5 mL/min (by C-G formula based on Cr of 1.07). ------------------------------------------------------------------------------------------------------------------ No results for input(s): HGBA1C in the last 72 hours. ------------------------------------------------------------------------------------------------------------------ No results for input(s): CHOL, HDL, LDLCALC, TRIG, CHOLHDL, LDLDIRECT in the last 72 hours. ------------------------------------------------------------------------------------------------------------------ No results for input(s): TSH, T4TOTAL, T3FREE, THYROIDAB in the last 72 hours.  Invalid input(s): FREET3 ------------------------------------------------------------------------------------------------------------------ No results for input(s): VITAMINB12, FOLATE, FERRITIN, TIBC, IRON, RETICCTPCT in the last 72 hours.  Coagulation profile  Recent Labs Lab 12/19/14 0115  INR 1.03    No results for input(s): DDIMER in the last 72 hours.  Cardiac Enzymes  Recent Labs Lab 12/14/14 0921 12/19/14 0115 12/19/14 0700  TROPONINI 0.05* 0.05* <0.03   ------------------------------------------------------------------------------------------------------------------ Invalid input(s): POCBNP     Time Spent in minutes   35   Cici Rodriges K M.D on 12/19/2014 at 12:33 PM  Between 7am to 7pm - Pager - [phone]  After 7pm go to [email or web address] - password [facility]  [facility]   Office  [phone]

## 2014-12-19 NOTE — Consult Note (Signed)
Dictation #:  [id number]

## 2014-12-19 NOTE — Evaluation (Signed)
Physical Therapy Evaluation Patient Details Name: [name] MRN: [id number] DOB: 1966-03-20 Today's Date: 12/19/2014   History of Present Illness  Patient is a 49 yo female admitted 12/18/14 with acute on chronic resp failure, CHF.  Patient recently admitted 3/16 - 3/25 with pna, COPD, CHF (just d/c on day of admit).  PMH:  asthma, obesity, COPD, CHF, DM, depression, anxiety, HTN, fibromyalgia, polysubstance abuse, OSA on CPAP  Clinical Impression  Patient presents with problems listed below.  Will benefit from acute PT to maximize independence prior to discharge home.  Patient should return to mod I level prior to discharge.  Do not anticipate any f/u PT needs at discharge.    Follow Up Recommendations Supervision - Intermittent;No PT follow up    Equipment Recommendations  None recommended by PT    Recommendations for Other Services       Precautions / Restrictions Precautions Precautions: Fall Restrictions Weight Bearing Restrictions: No      Mobility  Bed Mobility Overal bed mobility: Modified Independent             General bed mobility comments: Increased time to move supine to sit.  Good balance in sitting once upright.  Transfers Overall transfer level: Needs assistance Equipment used: None Transfers: Sit to/from Stand Sit to Stand: Min guard         General transfer comment: Patient moves to standing with no physical assist.  Good balance with static standing.  Patient abruptly returned to sitting, stating that is all she wants to do because of her abdomen pain.  Ambulation/Gait                Stairs            Wheelchair Mobility    Modified Rankin (Stroke Patients Only)       Balance                                             Pertinent Vitals/Pain Pain Assessment: 0-10 Pain Score: 10-Worst pain ever Pain Location: Abdomen Pain Descriptors / Indicators: Cramping;Spasm Pain Intervention(s): Monitored  during session;Repositioned    Home Living Family/patient expects to be discharged to:: Private residence Living Arrangements: Alone Available Help at Discharge: Personal care attendant;Available PRN/intermittently Type of Home: Apartment Home Access: Level entry     Home Layout: One level Home Equipment: Walker - 2 wheels;Bedside commode;Shower seat (home O2)      Prior Function Level of Independence: Independent with assistive device(s);Needs assistance   Gait / Transfers Assistance Needed: Patient reports using RW  ADL's / Homemaking Assistance Needed: Has Aide - unable to state when available and what they assist her with        Hand Dominance        Extremity/Trunk Assessment   Upper Extremity Assessment: Overall WFL for tasks assessed           Lower Extremity Assessment: Generalized weakness         Communication   Communication: No difficulties  Cognition Arousal/Alertness: Lethargic Behavior During Therapy: Agitated;Flat affect Overall Cognitive Status: Within Functional Limits for tasks assessed                      General Comments      Exercises        Assessment/Plan    PT Assessment Patient needs continued PT  services  PT Diagnosis Difficulty walking;Abnormality of gait;Generalized weakness;Acute pain   PT Problem List Decreased strength;Decreased activity tolerance;Decreased balance;Decreased mobility;Decreased knowledge of use of DME;Decreased safety awareness;Cardiopulmonary status limiting activity;Obesity;Pain  PT Treatment Interventions DME instruction;Gait training;Functional mobility training;Therapeutic activities;Therapeutic exercise;Patient/family education   PT Goals (Current goals can be found in the Care Plan section) Acute Rehab PT Goals Patient Stated Goal: To "get out of here" PT Goal Formulation: With patient Time For Goal Achievement: 12/26/14 Potential to Achieve Goals: Good    Frequency Min 3X/week    Barriers to discharge Decreased caregiver support Patient lives alone    Co-evaluation               End of Session Equipment Utilized During Treatment: Oxygen Activity Tolerance: Patient limited by fatigue;Patient limited by lethargy;Treatment limited secondary to agitation Patient left: in bed;with call bell/phone within reach (sitting on EOB) Nurse Communication: Mobility status;Patient requests pain meds (Request for food; Request to see nurse in charge of facility)         Time: [phone] PT Time Calculation (min) (ACUTE ONLY): 20 min   Charges:   PT Evaluation $Initial PT Evaluation Tier I: 1 Procedure     PT G Codes:        Despina Pole January 15, 2015, 6:08 PM Carita Pian. Sanjuana Kava, [place] Pager [phone]

## 2014-12-19 NOTE — Consult Note (Signed)
[name], [name]              ACCOUNT NO.:  [id number]  MEDICAL RECORD NO.:  [id number]  LOCATION:  2C07C                        FACILITY:  [facility]  PHYSICIAN:  Kathee Delton, MD,FCCPDATE OF BIRTH:  December 08[birth year] 1966  DATE OF CONSULTATION:  12/19/2014 DATE OF DISCHARGE:                                CONSULTATION   REFERRING PHYSICIAN:  [facility].  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  The patient also tells me that she was diagnosed with sarcoidosis in 1997, but is unable to tell me how this diagnosis was made and there have never been records available. The patient's clinical course and x-rays have never been overly suggestive of sarcoidosis.  The patient has always been a difficult management issue because of ongoing smoking and also her inability to lose weight.  She was recently hospitalized for acute on chronic respiratory failure that was felt to be a combination of community-acquired pneumonia, congestive heart failure, and possible COPD exacerbation.  She was diuresed and treated with antibiotics, but returned after just 1 day out of the hospital.  She was found to have significant hypoxemia, respiratory failure with anxiety, and a feeling of panic.  She has been readmitted and is currently on aggressive bronchodilators.  She had a V/Q scan that showed only matched defects consistent with an indeterminate scan.  She has undergone a CT chest that showed no evidence of PE time,but she does have bilateral ground- glass opacities that appear more severe than her scan last year. In the past, these have been felt secondary to edema and also mosaicism more than anything else, but  certainly an inflammatory lung disease could not be ruled out.  She is a very poor biopsy candidate because of her ongoing medical issues.  Currently, the patient is still requiring high- flow oxygen in order to maintain her saturation.  She does not have any significant wheezing on exam, but does have some mild lower extremity edema and an elevated brain natriuretic peptide.  She has very little cough or mucous production at this time.  PAST MEDICAL HISTORY: 1. Significant for her moderate to severe COPD with chronic     respiratory failure. 2. History of diastolic dysfunction with recurrent episodes of heart     failure. 3. Obesity hypoventilation syndrome/obstructive sleep apnea. 4. Questionable diagnosis of sarcoidosis which has been undocumented. 5. History of hypertension. 6. History of dyslipidemia.  FAMILY HISTORY:  Remarkable for heart disease, hypertension, and emphysema.  SOCIAL HISTORY:  The patient has a longstanding history of tobacco abuse, and has not been able to quit.  She tells me that she quit approximately a month ago, and she also has a history of using illicit drugs.  ALLERGIES:  The patient is allergic to Bactrim and simvastatin.  REVIEW OF SYSTEMS:  A 10-point review of systems was unremarkable except for that in the history of present illness.  PHYSICAL EXAMINATION:  GENERAL:  She is a morbidly obese female  with mild respiratory distress at this time. VITAL SIGNS:  Blood pressure is 145/107, pulse is 86, respiratory rate 16 to 18.  She is afebrile.  O2 saturation on 100% non-rebreather is 99%. HEENT:  Pupils equal, round, reactive to light and accommodation. Extraocular muscles are intact.  Nares patent without discharge. Oropharynx is clear. NECK:  Supple without JVD or lymphadenopathy.  There is no palpable thyromegaly. CHEST: Reveals decreased breath sounds diffusely with basilar crackles. CARDIAC: Exam with a regular rate and rhythm and no  murmur. ABDOMEN:  Soft, nontender, nondistended with good bowel sounds. GENITAL:  Rectal exam and breast exam was not done, not indicated. LOWER EXTREMITIES:  Shows mild edema, pulses are intact distally, and there is no cyanosis. NEUROLOGIC:  She is alert and oriented and moves all 4 extremities.  IMPRESSION: 1. Acute on chronic respiratory failure that is multifactorial.  The     patient has moderate to severe COPD by prior PFTs, but     unfortunately continued to smoke until approximately 1-2 months     prior to admission.  She has known diastolic dysfunction with a     tendency toward fluid overload and has an elevated brain     natriuretic peptide.  She does not have a pulmonary embolus by the     recent CT scan, but continues to have ground glass infiltrates that     I think are a little worse than last year.  It is unclear how much     of these are related to edema, some inflammatory process such as     sarcoid or desquamative interstitial pneumonitis with her history     of smoking, or whether a lot of this could simply be mosaicism     related to her underlying obstructive lung disease.  She has always     been a very poor candidate for any type of biopsy, but if her     degree of hypoxemia remains unexplained on this admission, she may     need some type of biopsy procedure.  I have always been hesitant to     treat her empirically with steroids given her morbid obesity and     very severe diabetes.  That is why specific diagnosis really needs     to be made if she continues to have refractory hypoxemia that is     not improving.  SUGGESTIONS: 1. We would aggressively diurese the patient to make sure a lot of her     issues are not related to volume overload. 2. check angiotensin converting enzyme level and sed rate. 3. would hold on empiric steroids pending her lab results and     response to diuresis, although she may have to be treated     empirically if her condition  worsens. 4. would consider an inspiratory and expiratory CT chest to try and     establish how much of her abnormality on CT is related to     mosaicism/air trapping.     Kathee Delton, MD,FCCP     [facility]  D:  12/19/2014  T:  12/19/2014  Job:  [id number]

## 2014-12-20 DIAGNOSIS — D869 Sarcoidosis, unspecified: Secondary | ICD-10-CM

## 2014-12-20 DIAGNOSIS — J438 Other emphysema: Secondary | ICD-10-CM | POA: Insufficient documentation

## 2014-12-20 LAB — BASIC METABOLIC PANEL
ANION GAP: 10 (ref 5–15)
BUN: 16 mg/dL (ref 6–23)
CO2: 30 mmol/L (ref 19–32)
CREATININE: 0.91 mg/dL (ref 0.50–1.10)
Calcium: 7.8 mg/dL — ABNORMAL LOW (ref 8.4–10.5)
Chloride: 96 mmol/L (ref 96–112)
GFR, EST AFRICAN AMERICAN: 86 mL/min — AB (ref >90)
GFR, EST NON AFRICAN AMERICAN: 74 mL/min — AB (ref >90)
Glucose, Bld: 180 mg/dL — ABNORMAL HIGH (ref 70–99)
Potassium: 3.3 mmol/L — ABNORMAL LOW (ref 3.5–5.1)
Sodium: 136 mmol/L (ref 135–145)

## 2014-12-20 LAB — CBC
HCT: 42 % (ref 36.0–46.0)
HEMOGLOBIN: 13.4 g/dL (ref 12.0–15.0)
MCH: 26.3 pg (ref 26.0–34.0)
MCHC: 31.9 g/dL (ref 30.0–36.0)
MCV: 82.5 fL (ref 78.0–100.0)
Platelets: 133 10*3/uL — ABNORMAL LOW (ref 150–400)
RBC: 5.09 MIL/uL (ref 3.87–5.11)
RDW: 19.7 % — AB (ref 11.5–15.5)
WBC: 15.4 10*3/uL — AB (ref 4.0–10.5)

## 2014-12-20 LAB — GLUCOSE, CAPILLARY
GLUCOSE-CAPILLARY: 195 mg/dL — AB (ref 70–99)
Glucose-Capillary: 172 mg/dL — ABNORMAL HIGH (ref 70–99)
Glucose-Capillary: 185 mg/dL — ABNORMAL HIGH (ref 70–99)
Glucose-Capillary: 177 mg/dL — ABNORMAL HIGH (ref 70–99)

## 2014-12-20 LAB — SEDIMENTATION RATE: Sed Rate: 34 mm/hr — ABNORMAL HIGH (ref 0–22)

## 2014-12-20 MED ORDER — ACETAMINOPHEN 325 MG PO TABS
650.0000 mg | ORAL_TABLET | Freq: Four times a day (QID) | ORAL | Status: DC | PRN
  Administered 2014-12-20 – 2014-12-26 (×6): 650 mg via ORAL
  Filled 2014-12-20 (×6): qty 2

## 2014-12-20 MED ORDER — IPRATROPIUM-ALBUTEROL 0.5-2.5 (3) MG/3ML IN SOLN
3.0000 mL | Freq: Four times a day (QID) | RESPIRATORY_TRACT | Status: DC
Start: 2014-12-21 — End: 2014-12-28
  Administered 2014-12-21 – 2014-12-28 (×23): 3 mL via RESPIRATORY_TRACT
  Filled 2014-12-20 (×23): qty 3

## 2014-12-20 MED ORDER — POTASSIUM CHLORIDE 10 MEQ/100ML IV SOLN
10.0000 meq | INTRAVENOUS | Status: DC
  Filled 2014-12-20 (×2): qty 100

## 2014-12-20 MED ORDER — FUROSEMIDE 10 MG/ML IJ SOLN
80.0000 mg | Freq: Three times a day (TID) | INTRAMUSCULAR | Status: AC
  Administered 2014-12-20 (×3): 80 mg via INTRAVENOUS
  Filled 2014-12-20 (×3): qty 8

## 2014-12-20 MED ORDER — POTASSIUM CHLORIDE CRYS ER 20 MEQ PO TBCR
40.0000 meq | EXTENDED_RELEASE_TABLET | Freq: Once | ORAL | Status: AC
  Administered 2014-12-20: 40 meq via ORAL
  Filled 2014-12-20: qty 2

## 2014-12-20 NOTE — Progress Notes (Signed)
Home medications were found in pt belongings. Pt did not want medications sent to pharmacy. The medications were taken from room, put in patient belonging bag, and placed behind nurse's station with pt label on belonging bag. Her sister will come in AM to pick up medications. Will pass to AM RN.    Hart Rochester, RN, BSN

## 2014-12-20 NOTE — Progress Notes (Signed)
Subjective: Doing much better today.  Diuresing well, and oxygenation better.   Objective: Vital signs in last 24 hours: Blood pressure 119/78, pulse 104, temperature 97.8 F (36.6 C), temperature source Oral, resp. rate 19, height 5' 8"  (1.727 m), weight 137.3 kg (302 lb 11.1 oz), last menstrual period 10/16/2012, SpO2 94 %.  Intake/Output from previous day: 03/26 0701 - 03/27 0700 In: 3 [I.V.:3] Out: 1900 [Urine:1900]   Physical Exam:   morbidly obese female in nad Nose without purulence or d/c noted. Neck without TMG or LN Chest with decreased bs, basilar crackles, no wheezing Cor with mild tachy but regular abd soft, nt LE with edema, no cyanosis Alert and oriented, moves all 4.    Lab Results:  Recent Labs  12/18/14 1846 12/20/14 0325  WBC 12.2* 15.4*  HGB 13.8 13.4  HCT 41.8 42.0  PLT 88* 133*   BMET  Recent Labs  12/18/14 1846 12/19/14 0700 12/20/14 0325  NA 130* 132* 136  K 4.9 5.8* 3.3*  CL 97 99 96  CO2 24 24 30   GLUCOSE 244* 373* 180*  BUN 14 18 16   CREATININE 0.96 1.07 0.91  CALCIUM 9.0 8.7 7.8*      Assessment/Plan:  1) Acute on chronic respiratory failure that is probably related to decompensated diastolic heart failure.  The pt tells me today that she has been noncompliant with her diuretics and fluid limits (I am thirsty all of the time), and currently is diuresing well with improved oxygenation.  Her CT chest shows GGO that are worse than her prior, and it is unclear if this is due to her edema, ?? Sarcoid (we have no documentation for this past diagnosis done out of state), or possibly a smoking related disease such as DIP??.  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  It may be worth repeating her ct after diuresis, with the inclusion of expiratory images.    -push diuresis until renal fxn "bumps" or BP becomes an issue. -f/u ESR/ACE -f/u cxr in am -consider f/u CT chest after diuresis with expiratory  images.  2) Severe COPD, but no bronchospasm on exam.  Pt is continuing to smoke intermittantly   3) OSA/OHS.  Continue on bipap at HS  Pt has recurrent admissions for these issues, and it may be all related to her noncompliance.  But, we have to make sure there is not a superimposed problem that we are not addressing. Would not send the pt home until we have tied up loose ends.      Kathee Delton, M.D. 12/20/2014, 11:57 AM

## 2014-12-20 NOTE — Progress Notes (Signed)
RT Note: Pt states she wants to wear the venti mask tonight instead of CPAP

## 2014-12-20 NOTE — Progress Notes (Signed)
Patient Demographics  [name], is a 49 y.o. female, DOB - Sep 23, 1966, RCV:[id number]  Admit date - 12/18/2014   Admitting Physician Ivor Costa, MD  Outpatient Primary MD for the patient is Yvette Riddle, MD  LOS - 2   Chief Complaint  Patient presents with  . Shortness of Breath        Subjective:   [name] today has, No headache, No chest pain, No abdominal pain - No Nausea, No new weakness tingling or numbness, No Cough - Improved SOB.    Assessment & Plan    1. Acute on chronic respiratory failure. On home oxygen, history of advanced COPD, obesity hypoventilation syndrome, obstructive sleep apnea along with chronic diastolic CHF with EF 10%.  Discussed with her primary pulmonologist Dr. [name] who saw the patient in the hospital today as well, plan is to aggressively diurese her for now, avoid any steroids, oxygen and nebulizer supplementation as needed. He has ordered some additional blood work to rule out sarcoidosis. She has been counseled to quit smoking. Continue daily at bedtime CPAP for sleep apnea. Further management per pulmonary.  Filed Weights   12/19/14 0004 12/20/14 0500  Weight: 136.986 kg (302 lb) 137.3 kg (302 lb 11.1 oz)     2.DM2 -  Continue Lantus, added sliding scale. Will monitor CBGs.  Lab Results  Component Value Date   HGBA1C 8.0* 10/19/2014    CBG (last 3)   Recent Labs  12/19/14 1649 12/19/14 2236 12/20/14 0834  GLUCAP 215* 195* 172*     3. GERD. Continue PPI.   4. Hyperkalemia. Diurese, Kayexalate. Recheck in the morning.   5. History of smoking. Counseled to quit.   6. Thrombocytopenia. HRT was checked recently negative, no evidence of bleeding. We will monitor.   7. Alcohol abuse. CIWA protocol.   8. Anxiety. As needed  Xanax.      Code Status: Full  Family Communication: None  Disposition Plan:  Home   Procedures   TTE ordered  CTA Chest - no PE, diffuse groundglass pattern  TTE April 2015  - Left ventricle: The cavity size was normal. There was moderate concentric hypertrophy. Systolic function wasnormal. The estimated ejection fraction was in the range of 55% to 60%. Although no diagnostic regional wall motionabnormality was identified, this possibility cannot becompletely excluded on the basis of this study. Doppler parameters are consistent with abnormal left ventricularrelaxation (grade 1 diastolic dysfunction). - Pulmonary arteries: PA peak pressure: 80mm Hg (S).   Consults  PCCM   Medications  Scheduled Meds: . ALPRAZolam  0.5 mg Oral BID  . antiseptic oral rinse  7 mL Mouth Rinse q12n4p  . aspirin EC  81 mg Oral Daily  . chlorhexidine  15 mL Mouth Rinse BID  . dextromethorphan-guaiFENesin  1 tablet Oral BID  . diclofenac sodium  4 g Topical QID  . fluticasone  2 spray Each Nare Daily  . folic acid  1 mg Oral Daily  . furosemide  60 mg Intravenous 3 times per day  . furosemide  80 mg Intravenous 3 times per day  . gabapentin  300 mg Oral QHS  . insulin aspart  0-15 Units Subcutaneous TID WC  . insulin aspart  0-5 Units Subcutaneous QHS  .  insulin glargine  20 Units Subcutaneous BID  . ipratropium-albuterol  3 mL Nebulization Q4H  . loratadine  10 mg Oral Daily  . LORazepam  0-4 mg Intravenous 4 times per day   Followed by  . [START ON 12/21/2014] LORazepam  0-4 mg Intravenous Q12H  . multivitamin with minerals  1 tablet Oral Daily  . omega-3 acid ethyl esters  1 capsule Oral Daily  . pantoprazole  40 mg Oral Daily  . sodium chloride  3 mL Intravenous Q12H  . thiamine  100 mg Oral Daily   Or  . thiamine  100 mg Intravenous Daily  . torsemide  100 mg Oral BID  . varenicline  0.5 mg Oral BID WC  . venlafaxine XR  37.5 mg Oral Q breakfast   Continuous  Infusions:  PRN Meds:.guaiFENesin, hydrOXYzine, levalbuterol, LORazepam, LORazepam **OR** LORazepam, ondansetron **OR** ondansetron (ZOFRAN) IV, oxyCODONE, tiZANidine  DVT Prophylaxis  SCD due to thrombocytopenia  Lab Results  Component Value Date   PLT 133* 12/20/2014    Antibiotics     Anti-infectives    None          Objective:   Filed Vitals:   12/20/14 0031 12/20/14 0452 12/20/14 0500 12/20/14 0836  BP: 116/69 124/74  119/78  Pulse: 104 102  104  Temp: 98 F (36.7 C) 97.4 F (36.3 C)  97.8 F (36.6 C)  TempSrc: Oral Axillary  Oral  Resp: 18 18  19   Height:      Weight:   137.3 kg (302 lb 11.1 oz)   SpO2: 96% 92%  94%    Wt Readings from Last 3 Encounters:  12/20/14 137.3 kg (302 lb 11.1 oz)  12/06/14 134.265 kg (296 lb)  11/18/14 137.077 kg (302 lb 3.2 oz)     Intake/Output Summary (Last 24 hours) at 12/20/14 0915 Last data filed at 12/20/14 0830  Gross per 24 hour  Intake      3 ml  Output   2350 ml  Net  -2347 ml     Physical Exam  Awake Alert, Oriented X 3, No new F.N deficits, Normal affect [place].AT,PERRAL Supple Neck,No JVD, No cervical lymphadenopathy appriciated.  Symmetrical Chest wall movement, Good air movement bilaterally, diffuse bilateral crackles RRR,No Gallops,Rubs or new Murmurs, No Parasternal Heave +ve B.Sounds, Abd Soft, No tenderness, No organomegaly appriciated, No rebound - guarding or rigidity. No Cyanosis, Clubbing or edema, No new Rash or bruise     Data Review   Micro Results Recent Results (from the past 240 hour(s))  MRSA PCR Screening     Status: Abnormal   Collection Time: 12/14/14  9:30 AM  Result Value Ref Range Status   MRSA by PCR POSITIVE (A) NEGATIVE Final    Comment:        The GeneXpert MRSA Assay (FDA approved for NASAL specimens only), is one component of a comprehensive MRSA colonization surveillance program. It is not intended to diagnose MRSA infection nor to guide or monitor treatment  for MRSA infections. RESULT CALLED TO, READ BACK BY AND VERIFIED WITH: MAIN,S @ 2536 ON [id number] BY POTEAT,S     Radiology Reports Dg Chest 2 View  12/13/2014   CLINICAL DATA:  Weakness and shortness of Breath  EXAM: CHEST  2 VIEW  COMPARISON:  12/11/2014  FINDINGS: Cardiac shadow is mildly enlarged. Patchy changes are noted in the bases bilaterally stable from the prior exam. No new focal infiltrate is seen. No sizable effusion is noted.  IMPRESSION: Patchy bibasilar  changes.  No acute abnormality is noted.   Electronically Signed   By: [name] M.D.   On: 12/13/2014 11:07   Dg Chest 2 View  12/11/2014   CLINICAL DATA:  Short of breath.  History pneumonia.  EXAM: CHEST  2 VIEW  COMPARISON:  12/09/2014  FINDINGS: Chronic lung disease with hyperinflation and bibasilar scarring. Prominent lung markings in the bases are stable with prior studies  Cardiac enlargement with pulmonary vascular congestion. Findings are consistent with mild fluid overload without edema or effusion. No definite pneumonia.  IMPRESSION: Chronic lung disease bibasilar scarring which appears stable  Cardiac enlargement with mild vascular congestion suggesting mild fluid overload.  No change from the prior study.   Electronically Signed   By: Franchot Gallo M.D.   On: 12/11/2014 10:36   Dg Chest 2 View (if Patient Has Fever And/or Copd)  12/09/2014   CLINICAL DATA:  Acute onset of severe shortness of breath. Initial encounter.  EXAM: CHEST  2 VIEW  COMPARISON:  Chest radiograph performed 10/31/2014  FINDINGS: The lungs are well-aerated. Vascular congestion is noted. Bilateral central airspace opacities raise concern for pulmonary edema, superimposed on chronic lung changes as previously noted. Mild pneumonia might have a similar appearance. There is no evidence of pleural effusion or pneumothorax.  The heart is borderline normal in size. No acute osseous abnormalities are seen.  IMPRESSION: Vascular congestion noted. Bilateral  central airspace opacities raise concern for mild pulmonary edema, superimposed on chronic lung changes. Mild pneumonia might have a similar appearance.   Electronically Signed   By: Garald Balding M.D.   On: 12/09/2014 04:22   Ct Angio Chest Pe W/cm &/or Wo Cm  12/18/2014   CLINICAL DATA:  Shortness breath, chest pain. History of sarcoidosis, COPD.  EXAM: CT ANGIOGRAPHY CHEST WITH CONTRAST  TECHNIQUE: Multidetector CT imaging of the chest was performed using the standard protocol during bolus administration of intravenous contrast. Multiplanar CT image reconstructions and MIPs were obtained to evaluate the vascular anatomy.  CONTRAST:  4mL OMNIPAQUE IOHEXOL 350 MG/ML SOLN  COMPARISON:  08/04/2013  FINDINGS: No filling defects in the pulmonary arteries to suggest pulmonary emboli. The distal vessels are not well visualized due to respiratory motion. Mild cardiomegaly. Aorta is normal caliber. Pulmonary artery remains dilated measuring 36 mm compared to 38 mm previously. Central pulmonary arteries appear prominent. Findings suggest pulmonary arterial hypertension.  Mild emphysema/COPD. Diffuse ground-glass opacities throughout the lungs are stable since prior study. No pleural effusions. Chronic bibasilar densities likely reflect scarring. No pleural effusions.  Small scattered borderline sized mediastinal and bilateral hilar lymph nodes, similar to prior study. No axillary adenopathy.  Chest wall soft tissues are unremarkable. Imaging into the upper abdomen demonstrates diffuse fatty infiltration of the liver. No acute findings.  No acute bony abnormality or focal bone lesion.  Review of the MIP images confirms the above findings.  IMPRESSION: No evidence of pulmonary embolus. The distal peripheral vessels are not well visualized due to respiratory motion.  Borderline heart size. Stable dilatation of the pulmonary arteries compatible with pulmonary arterial hypertension.  Underline COPD. Stable diffuse  ground-glass opacities throughout the lungs and bibasilar scarring.   Electronically Signed   By: Rolm Baptise M.D.   On: 12/18/2014 23:55   Nm Pulmonary Perf And Vent  12/17/2014   CLINICAL DATA:  49 year old female with shortness of breath and chest discomfort. Initial encounter.  EXAM: NUCLEAR MEDICINE VENTILATION - PERFUSION LUNG SCAN  TECHNIQUE: Ventilation images were obtained in  multiple projections using inhaled aerosol technetium 99 M DTPA. Perfusion images were obtained in multiple projections after intravenous injection of Tc-79m MAA.  RADIOPHARMACEUTICALS:  44.0 mCi Tc-25m DTPA aerosol and 5 point for mCi Tc-49m MAA  COMPARISON:  Portable chest radiograph 12/16/2014.  FINDINGS: Ventilation: Is heterogeneous bilaterally, in part due to central clumping of the ventilation agent. Scattered bilateral peripheral ventilation defects.  Perfusion: Scattered bilateral small and peripheral perfusion defects, but appear matched in both lungs.  IMPRESSION: Abnormal scan with matched bilateral pulmonary ventilation and perfusion defects that could reflect chronic obstructed pulmonary disease or chronic pulmonary embolus.  No scintigraphic evidence of acute PE.   Electronically Signed   By: Genevie Ann M.D.   On: 12/17/2014 13:05   Dg Chest Portable 1 View  12/18/2014   CLINICAL DATA:  Shortness of Breath, acute  EXAM: PORTABLE CHEST - 1 VIEW  COMPARISON:  December 16, 2014  FINDINGS: There is interstitial edema superimposed on emphysematous change and bibasilar scarring. Heart is mildly enlarged with pulmonary venous hypertension. No adenopathy. No bone lesions.  IMPRESSION: Congestive heart failure superimposed on emphysematous change. No airspace consolidation.   Electronically Signed   By: Lowella Grip III M.D.   On: 12/18/2014 19:20   Dg Chest Port 1 View  12/16/2014   CLINICAL DATA:  Shortness of breath, respiratory failure, CHF and pulmonary infiltrates.  EXAM: PORTABLE CHEST - 1 VIEW  COMPARISON:   12/14/2014  FINDINGS: Bibasilar opacities, right greater than left are favored to largely represent chronic areas of scarring and atelectasis when reviewing prior chest x-rays. No progressive pulmonary airspace consolidation is identified. There is no evidence of overt pulmonary edema or pleural fluid. No pneumothorax is identified. The heart size is stable and normal.  IMPRESSION: Remaining bibasilar opacities, right greater than left, are favored to represent chronic areas of scarring and atelectasis.   Electronically Signed   By: Aletta Edouard M.D.   On: 12/16/2014 11:38   Dg Chest Port 1 View  12/14/2014   CLINICAL DATA:  Shortness of breath and chest pain  EXAM: PORTABLE CHEST - 1 VIEW  COMPARISON:  12/13/2014  FINDINGS: Cardiac shadow remains enlarged. Patchy infiltrate is noted in the right lung base. Some diffuse interstitial changes are noted bilaterally which may be related to superimposed congestive failure. This is more prominent than that seen on the prior exam.  IMPRESSION: Stable patchy right basilar infiltrate.  Increasing vascular congestion and interstitial edema.   Electronically Signed   By: [name] M.D.   On: 12/14/2014 10:57     CBC  Recent Labs Lab 12/15/14 1303 12/16/14 0820 12/17/14 0550 12/18/14 1846 12/20/14 0325  WBC 11.2* 9.6 9.2 12.2* 15.4*  HGB 13.8 15.1* 14.3 13.8 13.4  HCT 42.1 46.4* 43.9 41.8 42.0  PLT 78* 91* 106* 88* 133*  MCV 82.1 81.7 81.6 80.4 82.5  MCH 26.9 26.6 26.6 26.5 26.3  MCHC 32.8 32.5 32.6 33.0 31.9  RDW 19.7* 19.8* 19.5* 19.6* 19.7*    Chemistries   Recent Labs Lab 12/16/14 0820 12/17/14 0550 12/18/14 1430 12/18/14 1846 12/19/14 0700 12/20/14 0325  NA 136 137  --  130* 132* 136  K 3.3* 2.9* 4.5 4.9 5.8* 3.3*  CL 93* 93*  --  97 99 96  CO2 31 31  --  24 24 30   GLUCOSE 207* 167*  --  244* 373* 180*  BUN 29* 23  --  14 18 16   CREATININE 1.09 0.80  --  0.96 1.07  0.91  CALCIUM 9.6 9.0  --  9.0 8.7 7.8*  AST  --   --   --   56* 30  --   ALT  --   --   --  63* 57*  --   ALKPHOS  --   --   --  106 96  --   BILITOT  --   --   --  2.0* 1.2  --    ------------------------------------------------------------------------------------------------------------------ estimated creatinine clearance is 112.6 mL/min (by C-G formula based on Cr of 0.91). ------------------------------------------------------------------------------------------------------------------ No results for input(s): HGBA1C in the last 72 hours. ------------------------------------------------------------------------------------------------------------------ No results for input(s): CHOL, HDL, LDLCALC, TRIG, CHOLHDL, LDLDIRECT in the last 72 hours. ------------------------------------------------------------------------------------------------------------------ No results for input(s): TSH, T4TOTAL, T3FREE, THYROIDAB in the last 72 hours.  Invalid input(s): FREET3 ------------------------------------------------------------------------------------------------------------------ No results for input(s): VITAMINB12, FOLATE, FERRITIN, TIBC, IRON, RETICCTPCT in the last 72 hours.  Coagulation profile  Recent Labs Lab 12/19/14 0115  INR 1.03    No results for input(s): DDIMER in the last 72 hours.  Cardiac Enzymes  Recent Labs Lab 12/19/14 0115 12/19/14 0700 12/19/14 1215  TROPONINI 0.05* <0.03 0.06*   ------------------------------------------------------------------------------------------------------------------ Invalid input(s): POCBNP     Time Spent in minutes   35   Danajah Birdsell K M.D on 12/20/2014 at 9:15 AM  Between 7am to 7pm - Pager - [phone]  After 7pm go to [email or web address] - password [facility]  [facility]   Office  [phone]

## 2014-12-20 NOTE — Progress Notes (Signed)
Pt called staff into room to go over all her meds that she has received all day. Each time meds were given today each was announced outloud to pt and what each med was for. Says she's used to getting Xanax 3 times a day. Currently Xanax is ordered BID with PRN Ativan for positive CIWA Pt satisfied that she has gotten all her medication.

## 2014-12-20 NOTE — Progress Notes (Signed)
Rt Note: Pt states she will try CPAP. Pressure increased to 12cm H2O per pr request 10L O2 bleed in. Pt tolerating well at this time

## 2014-12-20 NOTE — Progress Notes (Signed)
Pt ambulated in hallway 40 ft tolerated poorly says i have to go back to the room I feel like a might going to be having a panic attack. Pt given encouragement started to hyperventilate used pursed lip breathing never had a panic attack pt returned to room recovered in chair says now I'm shaking Given 1 mg PO Ativan. 02 sat dropped to 89 on 5 ln/c while ambulating.

## 2014-12-21 ENCOUNTER — Inpatient Hospital Stay (HOSPITAL_COMMUNITY): Payer: Medicare Other

## 2014-12-21 DIAGNOSIS — J9611 Chronic respiratory failure with hypoxia: Secondary | ICD-10-CM

## 2014-12-21 DIAGNOSIS — I5031 Acute diastolic (congestive) heart failure: Secondary | ICD-10-CM

## 2014-12-21 DIAGNOSIS — R079 Chest pain, unspecified: Secondary | ICD-10-CM

## 2014-12-21 LAB — BASIC METABOLIC PANEL
ANION GAP: 9 (ref 5–15)
BUN: 17 mg/dL (ref 6–23)
CO2: 35 mmol/L — ABNORMAL HIGH (ref 19–32)
Calcium: 8 mg/dL — ABNORMAL LOW (ref 8.4–10.5)
Chloride: 94 mmol/L — ABNORMAL LOW (ref 96–112)
Creatinine, Ser: 1.16 mg/dL — ABNORMAL HIGH (ref 0.50–1.10)
GFR calc Af Amer: 64 mL/min — ABNORMAL LOW (ref >90)
GFR calc non Af Amer: 55 mL/min — ABNORMAL LOW (ref >90)
Glucose, Bld: 161 mg/dL — ABNORMAL HIGH (ref 70–99)
Potassium: 3.6 mmol/L (ref 3.5–5.1)
Sodium: 138 mmol/L (ref 135–145)

## 2014-12-21 LAB — HEMOGLOBIN A1C
HEMOGLOBIN A1C: 9.4 % — AB (ref 4.8–5.6)
MEAN PLASMA GLUCOSE: 223 mg/dL

## 2014-12-21 LAB — LEGIONELLA ANTIGEN, URINE

## 2014-12-21 LAB — CBC
HCT: 41.9 % (ref 36.0–46.0)
Hemoglobin: 13.3 g/dL (ref 12.0–15.0)
MCH: 26.4 pg (ref 26.0–34.0)
MCHC: 31.7 g/dL (ref 30.0–36.0)
MCV: 83.1 fL (ref 78.0–100.0)
Platelets: 127 10*3/uL — ABNORMAL LOW (ref 150–400)
RBC: 5.04 MIL/uL (ref 3.87–5.11)
RDW: 19.9 % — AB (ref 11.5–15.5)
WBC: 8.1 10*3/uL (ref 4.0–10.5)

## 2014-12-21 LAB — SODIUM, URINE, RANDOM: Sodium, Ur: <10 mmol/L

## 2014-12-21 LAB — GLUCOSE, CAPILLARY
Glucose-Capillary: 166 mg/dL — ABNORMAL HIGH (ref 70–99)
Glucose-Capillary: 195 mg/dL — ABNORMAL HIGH (ref 70–99)
Glucose-Capillary: 283 mg/dL — ABNORMAL HIGH (ref 70–99)
Glucose-Capillary: 354 mg/dL — ABNORMAL HIGH (ref 70–99)

## 2014-12-21 LAB — CREATININE, URINE, RANDOM: Creatinine, Urine: 125.18 mg/dL

## 2014-12-21 LAB — OSMOLALITY: OSMOLALITY: 296 mosm/kg (ref 275–300)

## 2014-12-21 LAB — ANGIOTENSIN CONVERTING ENZYME: ANGIOTENSIN-CONVERTING ENZYME: 55 U/L (ref 14–82)

## 2014-12-21 LAB — OSMOLALITY, URINE: Osmolality, Ur: 361 mOsm/kg — ABNORMAL LOW (ref 390–1090)

## 2014-12-21 MED ORDER — OXYCODONE HCL 5 MG PO TABS
10.0000 mg | ORAL_TABLET | Freq: Four times a day (QID) | ORAL | Status: DC | PRN
  Administered 2014-12-21 – 2014-12-28 (×19): 10 mg via ORAL
  Filled 2014-12-21 (×19): qty 2

## 2014-12-21 MED ORDER — METHYLPREDNISOLONE SODIUM SUCC 40 MG IJ SOLR
40.0000 mg | Freq: Two times a day (BID) | INTRAMUSCULAR | Status: DC
  Administered 2014-12-21 – 2014-12-24 (×6): 40 mg via INTRAVENOUS
  Filled 2014-12-21 (×8): qty 1

## 2014-12-21 MED ORDER — ALPRAZOLAM 0.5 MG PO TABS
0.5000 mg | ORAL_TABLET | Freq: Three times a day (TID) | ORAL | Status: DC
  Administered 2014-12-21 – 2014-12-22 (×5): 0.5 mg via ORAL
  Filled 2014-12-21 (×5): qty 1

## 2014-12-21 NOTE — Progress Notes (Signed)
Pt c/o IV bothering her left her isolation room to get a 2x2 to take out her saline lock. Went back to room and pt had pulled out her IV and set it on the bedside table. All of catheter intact. Asked pt to please not do that again and that I worry about her breaking off part of the catheter. Order placed to IV team to come and evaluate pt for new IV start.

## 2014-12-21 NOTE — Progress Notes (Signed)
CPAP beside patient bedside and ready for use.  12cm H20 with 10L 02 bleed in.  Patient will place herself on when ready for bed.

## 2014-12-21 NOTE — Progress Notes (Signed)
Pt aware she is scheduled for a CT scan of her chest says she will need Ativan prior to test - gets anxious and has Hx of panic attacks. Note placed outside her door stating she is on a 1500 cc fluid restriction - calls out and asks staff going by to give her a cup of water and to " not tell my Nurse " Pt is aware of her diet as Carb Modified and fluid restriction

## 2014-12-21 NOTE — Progress Notes (Signed)
Subjective: Not much improved today.   Diuresing well, but still requires 5L [place]  Objective: Vital signs in last 24 hours: Blood pressure 105/65, pulse 102, temperature 98.1 F (36.7 C), temperature source Oral, resp. rate 16, height 5' 8"  (1.727 m), weight 135.1 kg (297 lb 13.5 oz), last menstrual period 10/16/2012, SpO2 99 %.  Intake/Output from previous day: 03/27 0701 - 03/28 0700 In: 1053 [P.O.:1050; I.V.:3] Out: 2625 [Urine:2625]   Physical Exam:   morbidly obese female in nad Nose without purulence or d/c noted. Neck without TMG or LN Chest with decreased bs, basilar crackles, no wheezing Cor with mild tachy but regular abd soft, nt LE with edema, no cyanosis Alert and oriented, moves all 4.    Lab Results:  Recent Labs  12/18/14 1846 12/20/14 0325 12/21/14 0423  WBC 12.2* 15.4* 8.1  HGB 13.8 13.4 13.3  HCT 41.8 42.0 41.9  PLT 88* 133* 127*   BMET  Recent Labs  12/19/14 0700 12/20/14 0325 12/21/14 0423  NA 132* 136 138  K 5.8* 3.3* 3.6  CL 99 96 94*  CO2 24 30 35*  GLUCOSE 373* 180* 161*  BUN 18 16 17   CREATININE 1.07 0.91 1.16*  CALCIUM 8.7 7.8* 8.0*      Assessment/Plan:  1) Acute on chronic respiratory failure that is probably related to decompensated diastolic heart failure.  She is diuresing well with improved oxygenation.  Her CT chest shows GGO that are worse than her prior in 12/2013, and it is unclear if this is due to her edema, ?? Sarcoid (we have no documentation for this past diagnosis done out of state), or possibly a smoking related disease such as DIP?? 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  ESR/ACE levels nml BNp low  - diuresis limited by renal function -f/u O2 requirements for improvment -doubt rpt  CT chest after diuresis would add much -empiric trial of solumedrol x 2-3 days- run some risk of worsening her glucose control -May have to consider  bronchoscopy once O2 needs decline  2) Severe COPD, but no bronchospasm on exam.  Pt is continuing to smoke intermittantly   3) OSA/OHS.  Continue on bipap at [facility]  Pt has recurrent admissions for these issues, and it may be all related to her noncompliance.  But, we have to make sure there is not a superimposed problem that we are not addressing. Would not send the pt home until we have tied up loose ends.   Yvette Mead MD. [name]. [facility] Pager [phone] If no response call [phone]    12/21/2014, 2:05 PM

## 2014-12-21 NOTE — Progress Notes (Signed)
Pt's sister Shirlean Mylar) took pt's personal medications home. Ranelle Oyster, RN

## 2014-12-21 NOTE — Progress Notes (Signed)
Called report to [place] spoke with Rena RN  Pt will first go to CT of chest prior to being transferred to [facility] Bed 34

## 2014-12-21 NOTE — Progress Notes (Signed)
Pt to CT for chest CT than will be transferred to [address] bed 34 per her bed. All belongings taken to her new room.

## 2014-12-21 NOTE — Progress Notes (Signed)
  Echocardiogram 2D Echocardiogram has been performed.  [name] 12/21/2014, 4:12 PM

## 2014-12-21 NOTE — Progress Notes (Signed)
Physical Therapy Treatment Patient Details Name: [name] MRN: [id number] DOB: May 15, 1966 Today's Date: 12/21/2014    History of Present Illness Patient is a 49 yo female admitted 12/18/14 with acute on chronic resp failure, CHF.  Patient recently admitted 3/16 - 3/25 with pna, COPD, CHF (just d/c on day of admit).  PMH:  asthma, obesity, COPD, CHF, DM, depression, anxiety, HTN, fibromyalgia, polysubstance abuse, OSA on CPAP    PT Comments    Patient tolerated increased ambulation this day with 3 standing extended rest breaks (>1 Minute). Ambulated on 6 liters [place] with desaturation to 86%, increased to 8 liters, improved to >90%. Extensive education re: energy conservation and breath control techniques with teach back. Will continue to see and progress as tolerated.   Follow Up Recommendations  Supervision - Intermittent;No PT follow up     Equipment Recommendations  None recommended by PT    Recommendations for Other Services       Precautions / Restrictions Precautions Precautions: Fall Restrictions Weight Bearing Restrictions: No    Mobility  Bed Mobility Overal bed mobility: Modified Independent             General bed mobility comments: Increased time to move supine to sit.  Good balance in sitting once upright.  Transfers Overall transfer level: Needs assistance Equipment used: Rolling walker (2 wheeled);None Transfers: Sit to/from Stand Sit to Stand: Min guard         General transfer comment: VCs for hand placement upon coming to standing. performed with and without RW (x3)  Ambulation/Gait Ambulation/Gait assistance: Supervision Ambulation Distance (Feet): 140 Feet Assistive device: Rolling walker (2 wheeled) Gait Pattern/deviations: Step-through pattern;Decreased stride length;Wide base of support Gait velocity: decreased Gait velocity interpretation: Below normal speed for age/gender General Gait Details: 3 standing rest breaks with max cues  for relaxation and controlled breathing, educated on pursed lip breathing. desaturation to 86% on 6 liters, increased to 8 liters [place], cued for slowed deep breathing. improved to 91%, then to 94% with 1 minute seated rest break.   Stairs            Wheelchair Mobility    Modified Rankin (Stroke Patients Only)       Balance                                    Cognition Arousal/Alertness: Awake/alert Behavior During Therapy: WFL for tasks assessed/performed;Anxious Overall Cognitive Status: Within Functional Limits for tasks assessed                      Exercises      General Comments General comments (skin integrity, edema, etc.): Patient educated extensively regarding energy conservation and breath support and control. Had patient perform exagerated pursed lip breathing with max cues and teach back.       Pertinent Vitals/Pain Pain Assessment: 0-10 Pain Score: 6  Pain Location: buttocks Pain Intervention(s): Monitored during session    Home Living                      Prior Function            PT Goals (current goals can now be found in the care plan section) Acute Rehab PT Goals Patient Stated Goal: To "get out of here" PT Goal Formulation: With patient Time For Goal Achievement: 12/26/14 Potential to Achieve Goals: Good Progress towards PT  goals: Progressing toward goals    Frequency  Min 3X/week    PT Plan Current plan remains appropriate    Co-evaluation             End of Session Equipment Utilized During Treatment: Oxygen Activity Tolerance: Patient tolerated treatment well;Patient limited by fatigue Patient left: in bed;with call bell/phone within reach (sitting on EOB)     Time: 0938-1829 PT Time Calculation (min) (ACUTE ONLY): 23 min  Charges:  $Gait Training: 8-22 mins $Therapeutic Activity: 8-22 mins                    G CodesDuncan Dull 01/04/2015, 12:18 PM Alben Deeds, [place] DPT   [phone]

## 2014-12-21 NOTE — Evaluation (Signed)
Occupational Therapy Evaluation Patient Details Name: [name] MRN: [id number] DOB: 1966-09-15 Today's Date: 12/21/2014    History of Present Illness Patient is a 49 yo female admitted 12/18/14 with acute on chronic resp failure, CHF.  Patient recently admitted 3/16 - 3/25 with pna, COPD, CHF (just d/c on day of admit).  PMH:  asthma, obesity, COPD, CHF, DM, depression, anxiety, HTN, fibromyalgia, polysubstance abuse, OSA on CPAP   Clinical Impression   Pt admitted with above. She demonstrates the below listed deficits and will benefit from continued OT to maximize safety and independence with BADLs.  Pt requires min A overall for BADLs.  She does have PCA at home who assists with BADLs.   She will benefit from continued OT to maximize safety and independence with BADLs.  02 sats decreased to 83% on 5L 02, increased to 92% on 6L      Follow Up Recommendations  No OT follow up;Supervision/Assistance - 24 hour    Equipment Recommendations  3 in 1 bedside comode    Recommendations for Other Services       Precautions / Restrictions Precautions Precautions: Fall Restrictions Weight Bearing Restrictions: No      Mobility Bed Mobility Overal bed mobility: Modified Independent             General bed mobility comments: Increased time to move supine to sit.  Good balance in sitting once upright.  Transfers Overall transfer level: Needs assistance Equipment used: Rolling walker (2 wheeled);None Transfers: Sit to/from [employer] to Stand: Supervision Stand pivot transfers: Supervision       General transfer comment: Pt refused RW use    Balance Overall balance assessment: Needs assistance Sitting-balance support: Feet supported Sitting balance-Leahy Scale: Good     Standing balance support: During functional activity Standing balance-Leahy Scale: Fair                              ADL Overall ADL's : Needs  assistance/impaired Eating/Feeding: Independent   Grooming: Wash/dry hands;Wash/dry face;Oral care;Brushing hair;Set up;Sitting   Upper Body Bathing: Supervision/ safety;Sitting   Lower Body Bathing: Minimal assistance;Sit to/from stand   Upper Body Dressing : Set up;Sitting   Lower Body Dressing: Supervision/safety;Sit to/from stand   Toilet Transfer: Supervision/safety;Ambulation;Comfort height toilet   Toileting- Clothing Manipulation and Hygiene: Supervision/safety;Sit to/from stand       Functional mobility during ADLs: Supervision/safety;Rolling walker General ADL Comments: Pt requires max encouragement to participate.  She requires 3 rest breaks.   02 sats dropped to 83-87% with activity on 5L.  02 increased to 6L with sats increasing to 93%     Vision     Perception     Praxis      Pertinent Vitals/Pain Pain Assessment: Faces Pain Score: 6  Faces Pain Scale: Hurts a little bit Pain Location: chest  Pain Descriptors / Indicators: Aching Pain Intervention(s): Monitored during session;Other (comment) (RN notified )     Hand Dominance     Extremity/Trunk Assessment Upper Extremity Assessment Upper Extremity Assessment: Generalized weakness   Lower Extremity Assessment Lower Extremity Assessment: Defer to PT evaluation   Cervical / Trunk Assessment Cervical / Trunk Assessment: Normal   Communication Communication Communication: No difficulties   Cognition Arousal/Alertness: Awake/alert Behavior During Therapy: WFL for tasks assessed/performed;Anxious Overall Cognitive Status: Within Functional Limits for tasks assessed  General Comments       Exercises       Shoulder Instructions      Home Living Family/patient expects to be discharged to:: Private residence Living Arrangements: Alone Available Help at Discharge: Personal care attendant;Available PRN/intermittently Type of Home: Apartment Home Access: Level entry      Home Layout: One level     Bathroom Shower/Tub: Tub/shower unit;Curtain Shower/tub characteristics: Architectural technologist: Standard     Home Equipment: Environmental consultant - 2 wheels;Bedside commode;Shower seat (home O2)   Additional Comments: Pt has a PCA 3 hours/day 7 days/week.       Prior Functioning/Environment Level of Independence: Needs assistance  Gait / Transfers Assistance Needed: Patient reports using RW ADL's / Homemaking Assistance Needed: Pt reports PCA assists with bathing and dressing, am meal prep, cleaning.  Pt reports she prepares lunch and dinner, and sometimes does reverse ADLs at night        OT Diagnosis: Generalized weakness   OT Problem List: Decreased strength;Decreased activity tolerance;Obesity;Cardiopulmonary status limiting activity   OT Treatment/Interventions: Self-care/ADL training;Therapeutic exercise;DME and/or AE instruction;Therapeutic activities;Cognitive remediation/compensation;Energy conservation;Patient/family education;Balance training    OT Goals(Current goals can be found in the care plan section) Acute Rehab OT Goals Patient Stated Goal: to go home  OT Goal Formulation: With patient Time For Goal Achievement: 01/04/15 Potential to Achieve Goals: Good ADL Goals Pt Will Perform Grooming: with modified independence;standing Pt Will Transfer to Toilet: with modified independence;ambulating;regular height toilet;bedside commode;grab bars Pt Will Perform Toileting - Clothing Manipulation and hygiene: with modified independence;sit to/from stand Pt/caregiver will Perform Home Exercise Program: Increased strength;Right Upper extremity;Left upper extremity;With written HEP provided;With theraband;Independently Additional ADL Goal #1: Pt will be independent with energy conservation techniques   OT Frequency: Min 2X/week   Barriers to D/C:            Co-evaluation              End of Session Equipment Utilized During Treatment:  Oxygen Nurse Communication: Mobility status  Activity Tolerance: Patient limited by fatigue Patient left: in bed;with call bell/phone within reach;with bed alarm set   Time: 5361-4431 OT Time Calculation (min): 40 min Charges:  OT General Charges $OT Visit: 1 Procedure OT Evaluation $Initial OT Evaluation Tier I: 1 Procedure OT Treatments $Self Care/Home Management : 8-22 mins $Therapeutic Activity: 8-22 mins G-Codes:    Shanty Ginty M 31-Dec-2014, 3:22 PM

## 2014-12-21 NOTE — Progress Notes (Signed)
Subjective: Doing much better today.  Diuresing well, and oxygenation better.   Objective: Vital signs in last 24 hours: Blood pressure 126/88, pulse 102, temperature 98 F (36.7 C), temperature source Axillary, resp. rate 16, height _0  (1.727 m), weight 135.1 kg (297 lb 13.5 oz), last menstrual period 10/16/2012, SpO2 99 %.  Intake/Output from previous day: 03/27 0701 - 03/28 0700 In: 1053 [P.O.:1050; I.V.:3] Out: 2625 [Urine:2625]   Physical Exam:  General:  morbidly obese female in nad Nose without purulence or d/c noted. Neck supple, no LN Chest with decreased bs, basilar crackles, no wheezing Cor with mild tachy but regular abd soft, nt LE with edema, no cyanosis Alert and oriented, moves all 4.    Lab Results:  Recent Labs  12/18/14 1846 12/20/14 0325 12/21/14 0423  WBC 12.2* 15.4* 8.1  HGB 13.8 13.4 13.3  HCT 41.8 42.0 41.9  PLT 88* 133* 127*   BMET  Recent Labs  12/19/14 0700 12/20/14 0325 12/21/14 0423  NA 132* 136 138  K 5.8* 3.3* 3.6  CL 99 96 94*  CO2 24 30 35*  GLUCOSE 373* 180* 161*  BUN _1 CREATININE 1.07 0.91 1.16*  CALCIUM 8.7 7.8* 8.0*      Assessment/Plan:  1) Acute on chronic respiratory failure that is probably related to decompensated diastolic heart failure.  The pt tells me today that she has been noncompliant with her diuretics and fluid limits (I am thirsty all of the time), and currently is diuresing well with improved oxygenation.  Her CT chest shows GGO that are worse than her prior, and it is unclear if this is due to her edema, ?? Sarcoid (we have no documentation for this past diagnosis done out of state), or possibly a smoking related disease such as DIP??.  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  It may be worth repeating her ct after diuresis, with the inclusion of expiratory images.    -Recommend holding further diureses at this point given renal function -F/u ESR/ACE pending  on 3/28 -F/u cxr I reviewed myself, improvement in edema at this point but bibasilar atelectasis noted -Will order an expiratory CT now that volume is optimized.  2) Severe COPD, but no bronchospasm on exam.  Pt is continuing to smoke intermittantly  -Smoking cessation stressed.  3) OSA/OHS.  [facility] bipap at [facility]  Will f/u in AM, may leave SDU from a pulmonary standpoint.  Rush Farmer, M.D. [facility]. Pager: [phone]. After hours pager: [phone].  12/21/2014, 1:08 PM

## 2014-12-21 NOTE — Progress Notes (Signed)
Patient Demographics  [name], is a 49 y.o. female, DOB - Feb 19, 1966, ELF:[id number]  Admit date - 12/18/2014   Admitting Physician Ivor Costa, MD  Outpatient Primary MD for the patient is Birdie Riddle, MD  LOS - 3   Chief Complaint  Patient presents with  . Shortness of Breath        Subjective:   [name] today has, No headache, No chest pain, No abdominal pain - No Nausea, No new weakness tingling or numbness, No Cough - Improved SOB.    Assessment & Plan    1. Acute on chronic respiratory failure. On home oxygen, history of advanced COPD, obesity hypoventilation syndrome, obstructive sleep apnea along with chronic diastolic CHF with EF 58%.  Per pulmonary she has been diuresed aggressively she is negative about 4 L, her creatinine has bumped hence will hold further diuresis, continue fluid restriction and salt restriction, repeat echogram pending, oxygen and nebulizer supplementation as needed. Her primary pulmonary physician Dr. [name]  has ordered some additional blood work to rule out sarcoidosis. She has been counseled to quit smoking. Continue daily at bedtime CPAP for sleep apnea. Further management per pulmonary.   Filed Weights   12/19/14 0004 12/20/14 0500 12/21/14 0448  Weight: 136.986 kg (302 lb) 137.3 kg (302 lb 11.1 oz) 135.1 kg (297 lb 13.5 oz)     2.DM2 -  Continue Lantus, added sliding scale. Will monitor CBGs.  Lab Results  Component Value Date   HGBA1C 9.4* 12/19/2014    CBG (last 3)   Recent Labs  12/20/14 1711 12/20/14 2108 12/21/14 0805  GLUCAP 185* 177* 166*     3. GERD. Continue PPI.   4. Hyperkalemia. Diurese, Kayexalate. Recheck in the morning.   5. History of smoking. Counseled to quit.   6. Thrombocytopenia. HRT was checked  recently negative, no evidence of bleeding. We will monitor.   7. Alcohol abuse. CIWA protocol.   8. Anxiety. As needed Xanax.      Code Status: Full  Family Communication: None  Disposition Plan:  Home   Procedures   TTE ordered  CTA Chest - no PE, diffuse groundglass pattern  TTE April 2015  - Left ventricle: The cavity size was normal. There was moderate concentric hypertrophy. Systolic function wasnormal. The estimated ejection fraction was in the range of 55% to 60%. Although no diagnostic regional wall motionabnormality was identified, this possibility cannot becompletely excluded on the basis of this study. Doppler parameters are consistent with abnormal left ventricularrelaxation (grade 1 diastolic dysfunction). - Pulmonary arteries: PA peak pressure: 21mm Hg (S).   Consults  PCCM   Medications  Scheduled Meds: . ALPRAZolam  0.5 mg Oral TID  . antiseptic oral rinse  7 mL Mouth Rinse q12n4p  . aspirin EC  81 mg Oral Daily  . dextromethorphan-guaiFENesin  1 tablet Oral BID  . diclofenac sodium  4 g Topical QID  . fluticasone  2 spray Each Nare Daily  . folic acid  1 mg Oral Daily  . gabapentin  300 mg Oral QHS  . insulin aspart  0-15 Units Subcutaneous TID WC  . insulin aspart  0-5 Units Subcutaneous QHS  . insulin glargine  20 Units Subcutaneous BID  . ipratropium-albuterol  3 mL Nebulization QID  . loratadine  10 mg Oral Daily  . LORazepam  0-4 mg Intravenous Q12H  . multivitamin with minerals  1 tablet Oral Daily  . omega-3 acid ethyl esters  1 capsule Oral Daily  . pantoprazole  40 mg Oral Daily  . sodium chloride  3 mL Intravenous Q12H  . thiamine  100 mg Oral Daily  . varenicline  0.5 mg Oral BID WC  . venlafaxine XR  37.5 mg Oral Q breakfast   Continuous Infusions:  PRN Meds:.acetaminophen, guaiFENesin, hydrOXYzine, levalbuterol, LORazepam, LORazepam **OR** LORazepam, [DISCONTINUED] ondansetron **OR** ondansetron (ZOFRAN) IV,  oxyCODONE, tiZANidine  DVT Prophylaxis  SCD due to thrombocytopenia  Lab Results  Component Value Date   PLT 127* 12/21/2014    Antibiotics     Anti-infectives    None          Objective:   Filed Vitals:   12/21/14 0040 12/21/14 0448 12/21/14 0546 12/21/14 0807  BP:   160/88 126/88  Pulse: 11  102   Temp: 97.7 F (36.5 C)  98.4 F (36.9 C) 98 F (36.7 C)  TempSrc: Axillary  Axillary Axillary  Resp: 20  16   Height:      Weight:  135.1 kg (297 lb 13.5 oz)    SpO2: 96%  99%     Wt Readings from Last 3 Encounters:  12/21/14 135.1 kg (297 lb 13.5 oz)  12/06/14 134.265 kg (296 lb)  11/18/14 137.077 kg (302 lb 3.2 oz)     Intake/Output Summary (Last 24 hours) at 12/21/14 1013 Last data filed at 12/21/14 0040  Gross per 24 hour  Intake    600 ml  Output   2175 ml  Net  -1575 ml     Physical Exam  Awake Alert, Oriented X 3, No new F.N deficits, Normal affect [place].AT,PERRAL Supple Neck,No JVD, No cervical lymphadenopathy appriciated.  Symmetrical Chest wall movement, Good air movement bilaterally, diffuse bilateral crackles RRR,No Gallops,Rubs or new Murmurs, No Parasternal Heave +ve B.Sounds, Abd Soft, No tenderness, No organomegaly appriciated, No rebound - guarding or rigidity. No Cyanosis, Clubbing or edema, No new Rash or bruise     Data Review   Micro Results Recent Results (from the past 240 hour(s))  MRSA PCR Screening     Status: Abnormal   Collection Time: 12/14/14  9:30 AM  Result Value Ref Range Status   MRSA by PCR POSITIVE (A) NEGATIVE Final    Comment:        The GeneXpert MRSA Assay (FDA approved for NASAL specimens only), is one component of a comprehensive MRSA colonization surveillance program. It is not intended to diagnose MRSA infection nor to guide or monitor treatment for MRSA infections. RESULT CALLED TO, READ BACK BY AND VERIFIED WITH: MAIN,S @ 4627 ON [id number] BY POTEAT,S     Radiology Reports Dg Chest 2  View  12/13/2014   CLINICAL DATA:  Weakness and shortness of Breath  EXAM: CHEST  2 VIEW  COMPARISON:  12/11/2014  FINDINGS: Cardiac shadow is mildly enlarged. Patchy changes are noted in the bases bilaterally stable from the prior exam. No new focal infiltrate is seen. No sizable effusion is noted.  IMPRESSION: Patchy bibasilar changes.  No acute abnormality is noted.   Electronically Signed   By: [name] M.D.   On: 12/13/2014 11:07   Dg Chest 2 View  12/11/2014   CLINICAL DATA:  Short of breath.  History pneumonia.  EXAM: CHEST  2 VIEW  COMPARISON:  12/09/2014  FINDINGS: Chronic lung disease with hyperinflation and bibasilar scarring. Prominent lung markings in the bases are stable with prior studies  Cardiac enlargement with pulmonary vascular congestion. Findings are consistent with mild fluid overload without edema or effusion. No definite pneumonia.  IMPRESSION: Chronic lung disease bibasilar scarring which appears stable  Cardiac enlargement with mild vascular congestion suggesting mild fluid overload.  No change from the prior study.   Electronically Signed   By: Franchot Gallo M.D.   On: 12/11/2014 10:36   Dg Chest 2 View (if Patient Has Fever And/or Copd)  12/09/2014   CLINICAL DATA:  Acute onset of severe shortness of breath. Initial encounter.  EXAM: CHEST  2 VIEW  COMPARISON:  Chest radiograph performed 10/31/2014  FINDINGS: The lungs are well-aerated. Vascular congestion is noted. Bilateral central airspace opacities raise concern for pulmonary edema, superimposed on chronic lung changes as previously noted. Mild pneumonia might have a similar appearance. There is no evidence of pleural effusion or pneumothorax.  The heart is borderline normal in size. No acute osseous abnormalities are seen.  IMPRESSION: Vascular congestion noted. Bilateral central airspace opacities raise concern for mild pulmonary edema, superimposed on chronic lung changes. Mild pneumonia might have a similar  appearance.   Electronically Signed   By: Garald Balding M.D.   On: 12/09/2014 04:22   Ct Angio Chest Pe W/cm &/or Wo Cm  12/18/2014   CLINICAL DATA:  Shortness breath, chest pain. History of sarcoidosis, COPD.  EXAM: CT ANGIOGRAPHY CHEST WITH CONTRAST  TECHNIQUE: Multidetector CT imaging of the chest was performed using the standard protocol during bolus administration of intravenous contrast. Multiplanar CT image reconstructions and MIPs were obtained to evaluate the vascular anatomy.  CONTRAST:  30mL OMNIPAQUE IOHEXOL 350 MG/ML SOLN  COMPARISON:  08/04/2013  FINDINGS: No filling defects in the pulmonary arteries to suggest pulmonary emboli. The distal vessels are not well visualized due to respiratory motion. Mild cardiomegaly. Aorta is normal caliber. Pulmonary artery remains dilated measuring 36 mm compared to 38 mm previously. Central pulmonary arteries appear prominent. Findings suggest pulmonary arterial hypertension.  Mild emphysema/COPD. Diffuse ground-glass opacities throughout the lungs are stable since prior study. No pleural effusions. Chronic bibasilar densities likely reflect scarring. No pleural effusions.  Small scattered borderline sized mediastinal and bilateral hilar lymph nodes, similar to prior study. No axillary adenopathy.  Chest wall soft tissues are unremarkable. Imaging into the upper abdomen demonstrates diffuse fatty infiltration of the liver. No acute findings.  No acute bony abnormality or focal bone lesion.  Review of the MIP images confirms the above findings.  IMPRESSION: No evidence of pulmonary embolus. The distal peripheral vessels are not well visualized due to respiratory motion.  Borderline heart size. Stable dilatation of the pulmonary arteries compatible with pulmonary arterial hypertension.  Underline COPD. Stable diffuse ground-glass opacities throughout the lungs and bibasilar scarring.   Electronically Signed   By: Rolm Baptise M.D.   On: 12/18/2014 23:55   Nm  Pulmonary Perf And Vent  12/17/2014   CLINICAL DATA:  49 year old female with shortness of breath and chest discomfort. Initial encounter.  EXAM: NUCLEAR MEDICINE VENTILATION - PERFUSION LUNG SCAN  TECHNIQUE: Ventilation images were obtained in multiple projections using inhaled aerosol technetium 99 M DTPA. Perfusion images were obtained in multiple projections after intravenous injection of Tc-57m MAA.  RADIOPHARMACEUTICALS:  44.0 mCi Tc-42m DTPA aerosol and 5 point for mCi Tc-71m MAA  COMPARISON:  Portable chest radiograph 12/16/2014.  FINDINGS: Ventilation: Is heterogeneous  bilaterally, in part due to central clumping of the ventilation agent. Scattered bilateral peripheral ventilation defects.  Perfusion: Scattered bilateral small and peripheral perfusion defects, but appear matched in both lungs.  IMPRESSION: Abnormal scan with matched bilateral pulmonary ventilation and perfusion defects that could reflect chronic obstructed pulmonary disease or chronic pulmonary embolus.  No scintigraphic evidence of acute PE.   Electronically Signed   By: Genevie Ann M.D.   On: 12/17/2014 13:05   Dg Chest Port 1 View  12/21/2014   CLINICAL DATA:  Respiratory failure  EXAM: PORTABLE CHEST - 1 VIEW  COMPARISON:  Chest x-ray and CT scan of the chest of December 18, 2014  FINDINGS: The lungs are well-expanded. The pulmonary interstitial markings remain increased but have improved slightly. There is subsegmental atelectasis at the right lung base that is stable. No significant pleural effusion is observed. The cardiac silhouette remains enlarged. The central pulmonary vascularity remains prominent but is less engorged today.  IMPRESSION: COPD. Mild improvement in interstitial edema secondary to CHF. There is persistent right basilar atelectasis or pneumonia.   Electronically Signed   By: David  Martinique   On: 12/21/2014 07:29   Dg Chest Portable 1 View  12/18/2014   CLINICAL DATA:  Shortness of Breath, acute  EXAM: PORTABLE CHEST  - 1 VIEW  COMPARISON:  December 16, 2014  FINDINGS: There is interstitial edema superimposed on emphysematous change and bibasilar scarring. Heart is mildly enlarged with pulmonary venous hypertension. No adenopathy. No bone lesions.  IMPRESSION: Congestive heart failure superimposed on emphysematous change. No airspace consolidation.   Electronically Signed   By: Lowella Grip III M.D.   On: 12/18/2014 19:20   Dg Chest Port 1 View  12/16/2014   CLINICAL DATA:  Shortness of breath, respiratory failure, CHF and pulmonary infiltrates.  EXAM: PORTABLE CHEST - 1 VIEW  COMPARISON:  12/14/2014  FINDINGS: Bibasilar opacities, right greater than left are favored to largely represent chronic areas of scarring and atelectasis when reviewing prior chest x-rays. No progressive pulmonary airspace consolidation is identified. There is no evidence of overt pulmonary edema or pleural fluid. No pneumothorax is identified. The heart size is stable and normal.  IMPRESSION: Remaining bibasilar opacities, right greater than left, are favored to represent chronic areas of scarring and atelectasis.   Electronically Signed   By: Aletta Edouard M.D.   On: 12/16/2014 11:38   Dg Chest Port 1 View  12/14/2014   CLINICAL DATA:  Shortness of breath and chest pain  EXAM: PORTABLE CHEST - 1 VIEW  COMPARISON:  12/13/2014  FINDINGS: Cardiac shadow remains enlarged. Patchy infiltrate is noted in the right lung base. Some diffuse interstitial changes are noted bilaterally which may be related to superimposed congestive failure. This is more prominent than that seen on the prior exam.  IMPRESSION: Stable patchy right basilar infiltrate.  Increasing vascular congestion and interstitial edema.   Electronically Signed   By: [name] M.D.   On: 12/14/2014 10:57     CBC  Recent Labs Lab 12/16/14 0820 12/17/14 0550 12/18/14 1846 12/20/14 0325 12/21/14 0423  WBC 9.6 9.2 12.2* 15.4* 8.1  HGB 15.1* 14.3 13.8 13.4 13.3  HCT 46.4* 43.9  41.8 42.0 41.9  PLT 91* 106* 88* 133* 127*  MCV 81.7 81.6 80.4 82.5 83.1  MCH 26.6 26.6 26.5 26.3 26.4  MCHC 32.5 32.6 33.0 31.9 31.7  RDW 19.8* 19.5* 19.6* 19.7* 19.9*    Chemistries   Recent Labs Lab 12/17/14 0550 12/18/14 1430 12/18/14  1846 12/19/14 0700 12/20/14 0325 12/21/14 0423  NA 137  --  130* 132* 136 138  K 2.9* 4.5 4.9 5.8* 3.3* 3.6  CL 93*  --  97 99 96 94*  CO2 31  --  24 24 30  35*  GLUCOSE 167*  --  244* 373* 180* 161*  BUN 23  --  14 18 16 17   CREATININE 0.80  --  0.96 1.07 0.91 1.16*  CALCIUM 9.0  --  9.0 8.7 7.8* 8.0*  AST  --   --  56* 30  --   --   ALT  --   --  63* 57*  --   --   ALKPHOS  --   --  106 96  --   --   BILITOT  --   --  2.0* 1.2  --   --    ------------------------------------------------------------------------------------------------------------------ estimated creatinine clearance is 87.5 mL/min (by C-G formula based on Cr of 1.16). ------------------------------------------------------------------------------------------------------------------  Recent Labs  12/19/14 1245  HGBA1C 9.4*   ------------------------------------------------------------------------------------------------------------------ No results for input(s): CHOL, HDL, LDLCALC, TRIG, CHOLHDL, LDLDIRECT in the last 72 hours. ------------------------------------------------------------------------------------------------------------------ No results for input(s): TSH, T4TOTAL, T3FREE, THYROIDAB in the last 72 hours.  Invalid input(s): FREET3 ------------------------------------------------------------------------------------------------------------------ No results for input(s): VITAMINB12, FOLATE, FERRITIN, TIBC, IRON, RETICCTPCT in the last 72 hours.  Coagulation profile  Recent Labs Lab 12/19/14 0115  INR 1.03    No results for input(s): DDIMER in the last 72 hours.  Cardiac Enzymes  Recent Labs Lab 12/19/14 0115 12/19/14 0700 12/19/14 1215   TROPONINI 0.05* <0.03 0.06*   ------------------------------------------------------------------------------------------------------------------ Invalid input(s): POCBNP     Time Spent in minutes   35   Alizae Bechtel K M.D on 12/21/2014 at 10:13 AM  Between 7am to 7pm - Pager - [phone]  After 7pm go to [email or web address] - password [facility]  [facility]   Office  [phone]

## 2014-12-22 DIAGNOSIS — R918 Other nonspecific abnormal finding of lung field: Secondary | ICD-10-CM | POA: Insufficient documentation

## 2014-12-22 DIAGNOSIS — J9601 Acute respiratory failure with hypoxia: Secondary | ICD-10-CM

## 2014-12-22 LAB — BASIC METABOLIC PANEL
Anion gap: 10 (ref 5–15)
BUN: 28 mg/dL — ABNORMAL HIGH (ref 6–23)
CALCIUM: 9 mg/dL (ref 8.4–10.5)
CO2: 28 mmol/L (ref 19–32)
CREATININE: 1.79 mg/dL — AB (ref 0.50–1.10)
Chloride: 91 mmol/L — ABNORMAL LOW (ref 96–112)
GFR calc Af Amer: 38 mL/min — ABNORMAL LOW (ref >90)
GFR calc non Af Amer: 33 mL/min — ABNORMAL LOW (ref >90)
Glucose, Bld: 310 mg/dL — ABNORMAL HIGH (ref 70–99)
Potassium: 4.9 mmol/L (ref 3.5–5.1)
Sodium: 129 mmol/L — ABNORMAL LOW (ref 135–145)

## 2014-12-22 LAB — GLUCOSE, CAPILLARY
GLUCOSE-CAPILLARY: 277 mg/dL — AB (ref 70–99)
GLUCOSE-CAPILLARY: 279 mg/dL — AB (ref 70–99)
GLUCOSE-CAPILLARY: 293 mg/dL — AB (ref 70–99)
Glucose-Capillary: 311 mg/dL — ABNORMAL HIGH (ref 70–99)

## 2014-12-22 MED ORDER — INSULIN GLARGINE 100 UNIT/ML ~~LOC~~ SOLN
15.0000 [IU] | Freq: Once | SUBCUTANEOUS | Status: AC
  Administered 2014-12-22: 15 [IU] via SUBCUTANEOUS
  Filled 2014-12-22: qty 0.15

## 2014-12-22 MED ORDER — INSULIN GLARGINE 100 UNIT/ML ~~LOC~~ SOLN
35.0000 [IU] | Freq: Two times a day (BID) | SUBCUTANEOUS | Status: DC
  Administered 2014-12-22 – 2014-12-23 (×3): 35 [IU] via SUBCUTANEOUS
  Filled 2014-12-22 (×5): qty 0.35

## 2014-12-22 MED ORDER — SODIUM CHLORIDE 0.9 % IV SOLN
INTRAVENOUS | Status: DC
  Administered 2014-12-22 – 2014-12-23 (×3): via INTRAVENOUS

## 2014-12-22 MED ORDER — ALPRAZOLAM 0.5 MG PO TABS
1.0000 mg | ORAL_TABLET | Freq: Three times a day (TID) | ORAL | Status: DC
  Administered 2014-12-22: 1 mg via ORAL
  Filled 2014-12-22: qty 2

## 2014-12-22 NOTE — Progress Notes (Signed)
Patient complaining of anxiety and states "it is because I take 1mg  xanax three times a day at home and not half a pill." PTA med list reviewed and verified. Dr.Singh notified and new orders received to place patient on her home dose of xanax 1mg  three times a day.

## 2014-12-22 NOTE — Progress Notes (Signed)
Helped pt place herself on CPAP that respiratory had set up. Ranelle Oyster, RN

## 2014-12-22 NOTE — Progress Notes (Signed)
Patient Demographics  [name], is a 49 y.o. female, DOB - February 28, 1966, OTR:[id number]  Admit date - 12/18/2014   Admitting Physician Ivor Costa, MD  Outpatient Primary MD for the patient is Birdie Riddle, MD  LOS - 4   Chief Complaint  Patient presents with  . Shortness of Breath        Subjective:   [name] today has, No headache, No chest pain, No abdominal pain - No Nausea, No new weakness tingling or numbness, No Cough - Improved SOB.    Assessment & Plan    1. Acute on chronic respiratory failure. On home oxygen, history of advanced COPD, obesity hypoventilation syndrome, obstructive sleep apnea along with chronic diastolic CHF with EF 83%.  She is on 4 L nasal cannula oxygen but is worse and requiring 6-8 L, despite aggressive diuresis shortness of breath has not resolved, pulmonary has been following, now on a trial of steroids. Pulmonary artery pressure was noted to be elevated on echogram. Echogram otherwise showing only mild grade 1 diastolic dysfunction. Further management per pulmonary.   Filed Weights   12/20/14 0500 12/21/14 0448 12/22/14 0619  Weight: 137.3 kg (302 lb 11.1 oz) 135.1 kg (297 lb 13.5 oz) 134.2 kg (295 lb 13.7 oz)      2.DM2 -  as she is on steroid will increase Lantus, added sliding scale. Will monitor CBGs.  Lab Results  Component Value Date   HGBA1C 9.4* 12/19/2014    CBG (last 3)   Recent Labs  12/21/14 1753 12/21/14 2122 12/22/14 0729  GLUCAP 283* 354* 277*     3. GERD. Continue PPI.   4. Hyperkalemia. after Kayexalate and diuresis.   5. History of smoking. Counseled to quit.   6. Thrombocytopenia. HRT was checked recently negative, no evidence of bleeding. We will monitor.   7. Alcohol abuse. CIWA protocol.   8.  Anxiety. As needed Xanax.   9. Hyponatremia with ARF. Due to overdiuresis, she is dehydrated, stop diuretics and place on IVF.      Code Status: Full  Family Communication: None  Disposition Plan:  Home   Procedures   TTE  12-21-14  - Left ventricle: The cavity size was normal. Wall thickness was increased in a pattern of moderate LVH. Systolic function was vigorous. The estimated ejection fraction was in the range of 65%to 70%. Doppler parameters are consistent with abnormal left ventricular relaxation (grade 1 diastolic dysfunction). Doppler parameters are consistent with high ventricular filling pressure. - Ventricular septum: The contour showed diastolic flattening and systolic flattening. - Mitral valve: There was mild regurgitation. - Right ventricle: The cavity size was moderately dilated. Systolic function was severely reduced. - Right atrium: The atrium was mildly dilated. - Atrial septum: The septum bowed from right to left, consistent with increased right atrial pressure. - Pulmonary arteries: Systolic pressure was severely increased. PA peak pressure: 106 mm Hg (S).  Impressions:  Normal LV function; grade 1 diastolic dysfunction; RAE/RVE; severely reduced RV function; severely elevated pulmonary pressure.    CTA Chest - no PE, diffuse groundglass pattern   TTE April 2015  - Left ventricle: The cavity size was normal. There was moderate concentric hypertrophy. Systolic function wasnormal. The estimated ejection fraction was in  the range of 55% to 60%. Although no diagnostic regional wall motionabnormality was identified, this possibility cannot becompletely excluded on the basis of this study. Doppler parameters are consistent with abnormal left ventricularrelaxation (grade 1 diastolic dysfunction). - Pulmonary arteries: PA peak pressure: 58mm Hg (S).   Consults  PCCM   Medications  Scheduled Meds: . ALPRAZolam  0.5 mg Oral TID  . antiseptic  oral rinse  7 mL Mouth Rinse q12n4p  . aspirin EC  81 mg Oral Daily  . dextromethorphan-guaiFENesin  1 tablet Oral BID  . diclofenac sodium  4 g Topical QID  . fluticasone  2 spray Each Nare Daily  . folic acid  1 mg Oral Daily  . gabapentin  300 mg Oral QHS  . insulin aspart  0-15 Units Subcutaneous TID WC  . insulin aspart  0-5 Units Subcutaneous QHS  . insulin glargine  15 Units Subcutaneous Once  . insulin glargine  35 Units Subcutaneous BID  . ipratropium-albuterol  3 mL Nebulization QID  . loratadine  10 mg Oral Daily  . LORazepam  0-4 mg Intravenous Q12H  . methylPREDNISolone (SOLU-MEDROL) injection  40 mg Intravenous Q12H  . multivitamin with minerals  1 tablet Oral Daily  . omega-3 acid ethyl esters  1 capsule Oral Daily  . pantoprazole  40 mg Oral Daily  . sodium chloride  3 mL Intravenous Q12H  . thiamine  100 mg Oral Daily  . varenicline  0.5 mg Oral BID WC  . venlafaxine XR  37.5 mg Oral Q breakfast   Continuous Infusions: . sodium chloride 100 mL/hr at 12/22/14 0749   PRN Meds:.acetaminophen, guaiFENesin, hydrOXYzine, levalbuterol, LORazepam, [DISCONTINUED] ondansetron **OR** ondansetron (ZOFRAN) IV, oxyCODONE, tiZANidine  DVT Prophylaxis  SCD due to thrombocytopenia  Lab Results  Component Value Date   PLT 127* 12/21/2014    Antibiotics     Anti-infectives    None          Objective:   Filed Vitals:   12/21/14 2113 12/21/14 2129 12/22/14 0005 12/22/14 0619  BP:  141/77 135/62 111/61  Pulse: 100 100 94 95  Temp:  98.9 F (37.2 C) 98.4 F (36.9 C) 97.8 F (36.6 C)  TempSrc:  Oral Oral Oral  Resp: 20 16 16 16   Height:      Weight:    134.2 kg (295 lb 13.7 oz)  SpO2:  92% 91% 92%    Wt Readings from Last 3 Encounters:  12/22/14 134.2 kg (295 lb 13.7 oz)  12/06/14 134.265 kg (296 lb)  11/18/14 137.077 kg (302 lb 3.2 oz)     Intake/Output Summary (Last 24 hours) at 12/22/14 1036 Last data filed at 12/22/14 0103  Gross per 24 hour    Intake    240 ml  Output   1430 ml  Net  -1190 ml     Physical Exam  Awake Alert, Oriented X 3, No new F.N deficits, Normal affect [place].AT,PERRAL Supple Neck,No JVD, No cervical lymphadenopathy appriciated.  Symmetrical Chest wall movement, Good air movement bilaterally, diffuse bilateral crackles RRR,No Gallops,Rubs or new Murmurs, No Parasternal Heave +ve B.Sounds, Abd Soft, No tenderness, No organomegaly appriciated, No rebound - guarding or rigidity. No Cyanosis, Clubbing or edema, No new Rash or bruise     Data Review   Micro Results Recent Results (from the past 240 hour(s))  MRSA PCR Screening     Status: Abnormal   Collection Time: 12/14/14  9:30 AM  Result Value Ref Range Status   MRSA  by PCR POSITIVE (A) NEGATIVE Final    Comment:        The GeneXpert MRSA Assay (FDA approved for NASAL specimens only), is one component of a comprehensive MRSA colonization surveillance program. It is not intended to diagnose MRSA infection nor to guide or monitor treatment for MRSA infections. RESULT CALLED TO, READ BACK BY AND VERIFIED WITH: MAIN,S @ 1308 ON [id number] BY POTEAT,S     Radiology Reports Dg Chest 2 View  12/13/2014   CLINICAL DATA:  Weakness and shortness of Breath  EXAM: CHEST  2 VIEW  COMPARISON:  12/11/2014  FINDINGS: Cardiac shadow is mildly enlarged. Patchy changes are noted in the bases bilaterally stable from the prior exam. No new focal infiltrate is seen. No sizable effusion is noted.  IMPRESSION: Patchy bibasilar changes.  No acute abnormality is noted.   Electronically Signed   By: [name] M.D.   On: 12/13/2014 11:07   Dg Chest 2 View  12/11/2014   CLINICAL DATA:  Short of breath.  History pneumonia.  EXAM: CHEST  2 VIEW  COMPARISON:  12/09/2014  FINDINGS: Chronic lung disease with hyperinflation and bibasilar scarring. Prominent lung markings in the bases are stable with prior studies  Cardiac enlargement with pulmonary vascular congestion. Findings  are consistent with mild fluid overload without edema or effusion. No definite pneumonia.  IMPRESSION: Chronic lung disease bibasilar scarring which appears stable  Cardiac enlargement with mild vascular congestion suggesting mild fluid overload.  No change from the prior study.   Electronically Signed   By: Franchot Gallo M.D.   On: 12/11/2014 10:36   Dg Chest 2 View (if Patient Has Fever And/or Copd)  12/09/2014   CLINICAL DATA:  Acute onset of severe shortness of breath. Initial encounter.  EXAM: CHEST  2 VIEW  COMPARISON:  Chest radiograph performed 10/31/2014  FINDINGS: The lungs are well-aerated. Vascular congestion is noted. Bilateral central airspace opacities raise concern for pulmonary edema, superimposed on chronic lung changes as previously noted. Mild pneumonia might have a similar appearance. There is no evidence of pleural effusion or pneumothorax.  The heart is borderline normal in size. No acute osseous abnormalities are seen.  IMPRESSION: Vascular congestion noted. Bilateral central airspace opacities raise concern for mild pulmonary edema, superimposed on chronic lung changes. Mild pneumonia might have a similar appearance.   Electronically Signed   By: Garald Balding M.D.   On: 12/09/2014 04:22   Ct Angio Chest Pe W/cm &/or Wo Cm  12/18/2014   CLINICAL DATA:  Shortness breath, chest pain. History of sarcoidosis, COPD.  EXAM: CT ANGIOGRAPHY CHEST WITH CONTRAST  TECHNIQUE: Multidetector CT imaging of the chest was performed using the standard protocol during bolus administration of intravenous contrast. Multiplanar CT image reconstructions and MIPs were obtained to evaluate the vascular anatomy.  CONTRAST:  42mL OMNIPAQUE IOHEXOL 350 MG/ML SOLN  COMPARISON:  08/04/2013  FINDINGS: No filling defects in the pulmonary arteries to suggest pulmonary emboli. The distal vessels are not well visualized due to respiratory motion. Mild cardiomegaly. Aorta is normal caliber. Pulmonary artery remains  dilated measuring 36 mm compared to 38 mm previously. Central pulmonary arteries appear prominent. Findings suggest pulmonary arterial hypertension.  Mild emphysema/COPD. Diffuse ground-glass opacities throughout the lungs are stable since prior study. No pleural effusions. Chronic bibasilar densities likely reflect scarring. No pleural effusions.  Small scattered borderline sized mediastinal and bilateral hilar lymph nodes, similar to prior study. No axillary adenopathy.  Chest wall soft tissues are unremarkable. Imaging  into the upper abdomen demonstrates diffuse fatty infiltration of the liver. No acute findings.  No acute bony abnormality or focal bone lesion.  Review of the MIP images confirms the above findings.  IMPRESSION: No evidence of pulmonary embolus. The distal peripheral vessels are not well visualized due to respiratory motion.  Borderline heart size. Stable dilatation of the pulmonary arteries compatible with pulmonary arterial hypertension.  Underline COPD. Stable diffuse ground-glass opacities throughout the lungs and bibasilar scarring.   Electronically Signed   By: Rolm Baptise M.D.   On: 12/18/2014 23:55   Nm Pulmonary Perf And Vent  12/17/2014   CLINICAL DATA:  49 year old female with shortness of breath and chest discomfort. Initial encounter.  EXAM: NUCLEAR MEDICINE VENTILATION - PERFUSION LUNG SCAN  TECHNIQUE: Ventilation images were obtained in multiple projections using inhaled aerosol technetium 99 M DTPA. Perfusion images were obtained in multiple projections after intravenous injection of Tc-42m MAA.  RADIOPHARMACEUTICALS:  44.0 mCi Tc-68m DTPA aerosol and 5 point for mCi Tc-94m MAA  COMPARISON:  Portable chest radiograph 12/16/2014.  FINDINGS: Ventilation: Is heterogeneous bilaterally, in part due to central clumping of the ventilation agent. Scattered bilateral peripheral ventilation defects.  Perfusion: Scattered bilateral small and peripheral perfusion defects, but appear  matched in both lungs.  IMPRESSION: Abnormal scan with matched bilateral pulmonary ventilation and perfusion defects that could reflect chronic obstructed pulmonary disease or chronic pulmonary embolus.  No scintigraphic evidence of acute PE.   Electronically Signed   By: Genevie Ann M.D.   On: 12/17/2014 13:05   Dg Chest Port 1 View  12/21/2014   CLINICAL DATA:  Respiratory failure  EXAM: PORTABLE CHEST - 1 VIEW  COMPARISON:  Chest x-ray and CT scan of the chest of December 18, 2014  FINDINGS: The lungs are well-expanded. The pulmonary interstitial markings remain increased but have improved slightly. There is subsegmental atelectasis at the right lung base that is stable. No significant pleural effusion is observed. The cardiac silhouette remains enlarged. The central pulmonary vascularity remains prominent but is less engorged today.  IMPRESSION: COPD. Mild improvement in interstitial edema secondary to CHF. There is persistent right basilar atelectasis or pneumonia.   Electronically Signed   By: David  Martinique   On: 12/21/2014 07:29   Dg Chest Portable 1 View  12/18/2014   CLINICAL DATA:  Shortness of Breath, acute  EXAM: PORTABLE CHEST - 1 VIEW  COMPARISON:  December 16, 2014  FINDINGS: There is interstitial edema superimposed on emphysematous change and bibasilar scarring. Heart is mildly enlarged with pulmonary venous hypertension. No adenopathy. No bone lesions.  IMPRESSION: Congestive heart failure superimposed on emphysematous change. No airspace consolidation.   Electronically Signed   By: Lowella Grip III M.D.   On: 12/18/2014 19:20   Dg Chest Port 1 View  12/16/2014   CLINICAL DATA:  Shortness of breath, respiratory failure, CHF and pulmonary infiltrates.  EXAM: PORTABLE CHEST - 1 VIEW  COMPARISON:  12/14/2014  FINDINGS: Bibasilar opacities, right greater than left are favored to largely represent chronic areas of scarring and atelectasis when reviewing prior chest x-rays. No progressive pulmonary  airspace consolidation is identified. There is no evidence of overt pulmonary edema or pleural fluid. No pneumothorax is identified. The heart size is stable and normal.  IMPRESSION: Remaining bibasilar opacities, right greater than left, are favored to represent chronic areas of scarring and atelectasis.   Electronically Signed   By: Aletta Edouard M.D.   On: 12/16/2014 11:38   Dg Chest [facility]  1 View  12/14/2014   CLINICAL DATA:  Shortness of breath and chest pain  EXAM: PORTABLE CHEST - 1 VIEW  COMPARISON:  12/13/2014  FINDINGS: Cardiac shadow remains enlarged. Patchy infiltrate is noted in the right lung base. Some diffuse interstitial changes are noted bilaterally which may be related to superimposed congestive failure. This is more prominent than that seen on the prior exam.  IMPRESSION: Stable patchy right basilar infiltrate.  Increasing vascular congestion and interstitial edema.   Electronically Signed   By: [name] M.D.   On: 12/14/2014 10:57     CBC  Recent Labs Lab 12/16/14 0820 12/17/14 0550 12/18/14 1846 12/20/14 0325 12/21/14 0423  WBC 9.6 9.2 12.2* 15.4* 8.1  HGB 15.1* 14.3 13.8 13.4 13.3  HCT 46.4* 43.9 41.8 42.0 41.9  PLT 91* 106* 88* 133* 127*  MCV 81.7 81.6 80.4 82.5 83.1  MCH 26.6 26.6 26.5 26.3 26.4  MCHC 32.5 32.6 33.0 31.9 31.7  RDW 19.8* 19.5* 19.6* 19.7* 19.9*    Chemistries   Recent Labs Lab 12/18/14 1846 12/19/14 0700 12/20/14 0325 12/21/14 0423 12/22/14 0526  NA 130* 132* 136 138 129*  K 4.9 5.8* 3.3* 3.6 4.9  CL 97 99 96 94* 91*  CO2 24 24 30  35* 28  GLUCOSE 244* 373* 180* 161* 310*  BUN 14 18 16 17  28*  CREATININE 0.96 1.07 0.91 1.16* 1.79*  CALCIUM 9.0 8.7 7.8* 8.0* 9.0  AST 56* 30  --   --   --   ALT 63* 57*  --   --   --   ALKPHOS 106 96  --   --   --   BILITOT 2.0* 1.2  --   --   --    ------------------------------------------------------------------------------------------------------------------ estimated creatinine clearance is  56.4 mL/min (by C-G formula based on Cr of 1.79). ------------------------------------------------------------------------------------------------------------------  Recent Labs  12/19/14 1245  HGBA1C 9.4*   ------------------------------------------------------------------------------------------------------------------ No results for input(s): CHOL, HDL, LDLCALC, TRIG, CHOLHDL, LDLDIRECT in the last 72 hours. ------------------------------------------------------------------------------------------------------------------ No results for input(s): TSH, T4TOTAL, T3FREE, THYROIDAB in the last 72 hours.  Invalid input(s): FREET3 ------------------------------------------------------------------------------------------------------------------ No results for input(s): VITAMINB12, FOLATE, FERRITIN, TIBC, IRON, RETICCTPCT in the last 72 hours.  Coagulation profile  Recent Labs Lab 12/19/14 0115  INR 1.03    No results for input(s): DDIMER in the last 72 hours.  Cardiac Enzymes  Recent Labs Lab 12/19/14 0115 12/19/14 0700 12/19/14 1215  TROPONINI 0.05* <0.03 0.06*   ------------------------------------------------------------------------------------------------------------------ Invalid input(s): POCBNP     Time Spent in minutes   35   Olive Zmuda K M.D on 12/22/2014 at 10:36 AM  Between 7am to 7pm - Pager - [phone]  After 7pm go to [email or web address] - password [facility]  [facility]   Office  [phone]

## 2014-12-22 NOTE — Progress Notes (Signed)
Subjective: No acute events overnight.  Continues to have high O2 requirements (6L).  Echo performed yesterday, see results below. Lasix held due to bump in SCr but pt reports she is still voiding a lot.  Objective: Vital signs in last 24 hours: Blood pressure 111/61, pulse 95, temperature 97.8 F (36.6 C), temperature source Oral, resp. rate 16, height 5' 8"  (1.727 m), weight 134.2 kg (295 lb 13.7 oz), last menstrual period 10/16/2012, SpO2 92 %.  Intake/Output from previous day: 03/28 0701 - 03/29 0700 In: 620 [P.O.:620] Out: 1430 [Urine:1430]   Physical Exam:  Morbidly obese female in nad Neck without TMG or LN Chest with decreased bs, faint bibasilar crackles, no wheezing Heart with RRR, no M/R/G Abd soft, NT/ND LE with edema, no cyanosis Alert and oriented, moves all 4  Lab Results:  Recent Labs  12/20/14 0325 12/21/14 0423  WBC 15.4* 8.1  HGB 13.4 13.3  HCT 42.0 41.9  PLT 133* 127*   BMET  Recent Labs  12/20/14 0325 12/21/14 0423 12/22/14 0526  NA 136 138 129*  K 3.3* 3.6 4.9  CL 96 94* 91*  CO2 30 35* 28  GLUCOSE 180* 161* 310*  BUN 16 17 28*  CREATININE 0.91 1.16* 1.79*  CALCIUM 7.8* 8.0* 9.0   Echo 12/21/14 >>>  EF 65 - 70%, grade 1 DD, mild MR, mod dilated RV and RA, severe PAH with PAP 106   Assessment/Plan:  Acute on chronic respiratory failure that is probably related to decompensated diastolic heart failure.  She is diuresing well but O2 needs haven't decreased.  Her CT chest shows GGO that are worse than her prior in 12/2013, and it is unclear if this is due to her edema, ?? Sarcoid (we have no documentation for this past diagnosis done out of state), or possibly a smoking related disease such as DIP?? (she did quit smoking in 10/2014). 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  ESR/ACE levels nml, BNP low Recs: Diuresis on hold due to bump in SCr - continue once able. F/u O2 requirements for  improvement. Doubt rpt  CT chest after diuresis would add much. Empiric trial of solumedrol x 2-3 days- run some risk of worsening her glucose control. May have to consider bronchoscopy once O2 needs decline.  Severe COPD with no acute bronchospasm on exam Recs: Continue supplemental O2. Continue BD's. Continue smoking cessation (states she quit 10/2014).  OSA/OHS Recs: Continue on bipap at HS. Weight loss encouraged.  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 Recs: Prevent hypoxemia and maintain SpO2 > 90% at all times. Continue diuresis as BP and SCr permit. Suspect that there would be little benefit from trial of advanced therapy (Sildenafil, Ambrisentan, Nifedipine, etc). Management of her COPD, diastolic dysfxn will be key here, as will treatment of possible interstitial lung disease if it exists (curently on steroid trial). If we do decide to try an agent, selection will be complicated. The ERA's have been shown to be most beneficial in auto-immune inflammatory diseases, while the sildenafil would likely be the choice if L sided CHF is the main culprit contributor.  Weight loss encouraged.    Montey Hora, PA - [facility]  Medicine Pager: [phone]  or [phone] 12/22/2014, 10:52 AM   Attending Note:  I have examined patient, reviewed labs, studies and notes. I have discussed the case with [place], and I agree with the data and plans as amended above.   Baltazar Apo, MD, PhD 12/22/2014, 3:01 PM [facility] [phone] or if no answer [phone]

## 2014-12-22 NOTE — Progress Notes (Signed)
PT Cancellation Note  Patient Details Name: [name] MRN: [id number] DOB: 1966/08/05   Cancelled Treatment:    Reason Eval/Treat Not Completed: Other (comment) (pt refused) .  Pt reports she is "not having a good day" re: her breathing.  She had a bad night.  She does want PT to check back tomorrow.  Thanks,   Barbarann Ehlers. [place], [place], DPT [phone]   12/22/2014, 4:03 PM

## 2014-12-22 NOTE — Progress Notes (Signed)
Rt Note:  Pt states she may not wear cpap tonight.  Rt will continue to monitor.

## 2014-12-22 NOTE — Progress Notes (Signed)
Pt refusing CPAP tonight. Will continue to monitor pt. Ranelle Oyster, RN

## 2014-12-23 LAB — BASIC METABOLIC PANEL
Anion gap: 10 (ref 5–15)
BUN: 26 mg/dL — ABNORMAL HIGH (ref 6–23)
CALCIUM: 9 mg/dL (ref 8.4–10.5)
CO2: 27 mmol/L (ref 19–32)
Chloride: 94 mmol/L — ABNORMAL LOW (ref 96–112)
Creatinine, Ser: 1.27 mg/dL — ABNORMAL HIGH (ref 0.50–1.10)
GFR calc Af Amer: 57 mL/min — ABNORMAL LOW (ref >90)
GFR calc non Af Amer: 49 mL/min — ABNORMAL LOW (ref >90)
Glucose, Bld: 226 mg/dL — ABNORMAL HIGH (ref 70–99)
Potassium: 4.3 mmol/L (ref 3.5–5.1)
SODIUM: 131 mmol/L — AB (ref 135–145)

## 2014-12-23 LAB — BLOOD GAS, ARTERIAL
Acid-Base Excess: 4.2 mmol/L — ABNORMAL HIGH (ref 0.0–2.0)
Bicarbonate: 29.1 mEq/L — ABNORMAL HIGH (ref 20.0–24.0)
DRAWN BY: 331471
O2 Content: 5 L/min
O2 Saturation: 96 %
Patient temperature: 98.6
TCO2: 30.6 mmol/L (ref 0–100)
pCO2 arterial: 50.7 mmHg — ABNORMAL HIGH (ref 35.0–45.0)
pH, Arterial: 7.377 (ref 7.350–7.450)
pO2, Arterial: 89.1 mmHg (ref 80.0–100.0)

## 2014-12-23 LAB — GLUCOSE, CAPILLARY
Glucose-Capillary: 228 mg/dL — ABNORMAL HIGH (ref 70–99)
Glucose-Capillary: 316 mg/dL — ABNORMAL HIGH (ref 70–99)
Glucose-Capillary: 307 mg/dL — ABNORMAL HIGH (ref 70–99)

## 2014-12-23 LAB — RAPID URINE DRUG SCREEN, HOSP PERFORMED
AMPHETAMINES: NOT DETECTED
Barbiturates: NOT DETECTED
Benzodiazepines: POSITIVE — AB
Cocaine: NOT DETECTED
OPIATES: NOT DETECTED
Tetrahydrocannabinol: NOT DETECTED

## 2014-12-23 MED ORDER — SODIUM CHLORIDE 0.9 % IV SOLN
3.0000 g | Freq: Four times a day (QID) | INTRAVENOUS | Status: DC
  Administered 2014-12-23 – 2014-12-28 (×21): 3 g via INTRAVENOUS
  Filled 2014-12-23 (×25): qty 3

## 2014-12-23 MED ORDER — SODIUM CHLORIDE 0.9 % IV SOLN
INTRAVENOUS | Status: AC
  Administered 2014-12-23: 12:00:00 via INTRAVENOUS

## 2014-12-23 MED ORDER — ALPRAZOLAM 0.5 MG PO TABS
0.5000 mg | ORAL_TABLET | Freq: Three times a day (TID) | ORAL | Status: DC
  Administered 2014-12-23 – 2014-12-28 (×17): 0.5 mg via ORAL
  Filled 2014-12-23 (×17): qty 1

## 2014-12-23 MED ORDER — INSULIN ASPART 100 UNIT/ML ~~LOC~~ SOLN
5.0000 [IU] | Freq: Three times a day (TID) | SUBCUTANEOUS | Status: DC
  Administered 2014-12-23 – 2014-12-28 (×13): 5 [IU] via SUBCUTANEOUS

## 2014-12-23 NOTE — Progress Notes (Signed)
ANTIBIOTIC CONSULT NOTE - INITIAL  Pharmacy Consult for Unasyn Indication: aspiration pneumonia  Allergies  Allergen Reactions  . Simvastatin Other (See Comments)    Severe leg pain and burning  . Sulfamethoxazole-Trimethoprim Nausea And Vomiting    Causes Projectile Vomiting  . Zolpidem Tartrate Other (See Comments)    Chest pain  . Azithromycin Other (See Comments)    Resistant to med  . Ceftin [Cefuroxime] Swelling and Other (See Comments)    MOUTH SWELLING  . Doxycycline Other (See Comments)    Resistant to med  . Latex Itching and Rash    Also burning sensations  . Metformin And Related Diarrhea    Severe diarrhea  . Metolazone Other (See Comments)    MYALGIAS  . Metronidazole Nausea And Vomiting  . Ciprofloxacin Nausea Only  . Tramadol Itching and Nausea Only    Patient Measurements: Height: 5\' 8"  (172.7 cm) Weight: (!) 300 lb 14.9 oz (136.5 kg) IBW/kg (Calculated) : 63.9  Vital Signs: Temp: 98 F (36.7 C) (03/30 0639) Temp Source: Oral (03/29 2255) BP: 128/75 mmHg (03/30 0639) Pulse Rate: 98 (03/30 0127) Intake/Output from previous day: 03/29 0701 - 03/30 0700 In: 1991.7 [P.O.:150; I.V.:1841.7] Out: 1150 [Urine:1150] Intake/Output from this shift:    Labs:  Recent Labs  12/21/14 0423 12/21/14 1100 12/22/14 0526 12/23/14 0502  WBC 8.1  --   --   --   HGB 13.3  --   --   --   PLT 127*  --   --   --   LABCREA  --  125.18  --   --   CREATININE 1.16*  --  1.79* 1.27*   Estimated Creatinine Clearance: 80.3 mL/min (by C-G formula based on Cr of 1.27). No results for input(s): VANCOTROUGH, VANCOPEAK, VANCORANDOM, GENTTROUGH, GENTPEAK, GENTRANDOM, TOBRATROUGH, TOBRAPEAK, TOBRARND, AMIKACINPEAK, AMIKACINTROU, AMIKACIN in the last 72 hours.   Microbiology: Recent Results (from the past 720 hour(s))  Culture, blood (routine x 2)     Status: None   Collection Time: 12/09/14  9:45 AM  Result Value Ref Range Status   Specimen Description BLOOD LEFT ARM   Final   Special Requests BOTTLES DRAWN AEROBIC AND ANAEROBIC 8CC  Final   Culture   Final    NO GROWTH 5 DAYS Performed at [facility]    Report Status 12/15/2014 FINAL  Final  Culture, blood (routine x 2)     Status: None   Collection Time: 12/09/14  9:50 AM  Result Value Ref Range Status   Specimen Description BLOOD LEFT ARM  Final   Special Requests BOTTLES DRAWN AEROBIC ONLY 4CC  Final   Culture   Final    NO GROWTH 5 DAYS Performed at [facility]    Report Status 12/15/2014 FINAL  Final  MRSA PCR Screening     Status: Abnormal   Collection Time: 12/14/14  9:30 AM  Result Value Ref Range Status   MRSA by PCR POSITIVE (A) NEGATIVE Final    Comment:        The GeneXpert MRSA Assay (FDA approved for NASAL specimens only), is one component of a comprehensive MRSA colonization surveillance program. It is not intended to diagnose MRSA infection nor to guide or monitor treatment for MRSA infections. RESULT CALLED TO, READ BACK BY AND VERIFIED WITH: MAIN,S @ 1215 ON [id number] BY POTEAT,S     Medical History: Past Medical History  Diagnosis Date  . Hypertension   . Hyperlipidemia   . Chronic headache   .  Fibromyalgia     daily narcotics  . Anxiety     hx chronic BZ use, stopped 07/2010  . Anemia   . Pulmonary sarcoidosis     unimpressive CT chest 2011  . Colonic polyp   . GERD (gastroesophageal reflux disease)   . ALLERGIC RHINITIS   . Asthma   . CHF (congestive heart failure)     Diastolic with fluid overload, May, 2012, LVEF 60%  . Morbid obesity   . Depression   . Panic attacks   . Diabetes mellitus   . COPD (chronic obstructive pulmonary disease)     on home O2, moderate airflow obstruction, suspect d/t emphysema  . Obesity   . Elevated LFTs 09/2011  . Ovarian cyst   . Chronic back pain   . Sleep apnea      CPAP  . Fatty liver   . Esophagitis   . Gastritis   . Internal hemorrhoids   . Adenomatous colon polyp 12/03/09    [facility] in [place], [place]    Medications:  Prescriptions prior to admission  Medication Sig Dispense Refill Last Dose  . acetaminophen (TYLENOL) 325 MG tablet Take 1,300 mg by mouth every 6 (six) hours as needed for moderate pain.    12/17/2014 at Unknown time  . albuterol (PROVENTIL) (2.5 MG/3ML) 0.083% nebulizer solution Take 3 mLs (2.5 mg total) by nebulization every 6 (six) hours as needed for wheezing or shortness of breath. 75 mL 2 12/18/2014 at Unknown time  . ALPRAZolam (XANAX) 1 MG tablet Take 1 mg by mouth 3 (three) times daily.   12/18/2014 at Unknown time  . aspirin EC 81 MG tablet Take 81 mg by mouth daily.   12/18/2014 at Unknown time  . BIOTIN PO Take 1 capsule by mouth daily.   12/18/2014 at Unknown time  . cetirizine (ZYRTEC) 1 MG/ML syrup TAKE 10 ML BY MOUTH EVERY NIGHT AT BEDTIME 300 mL 0 12/17/2014 at Unknown time  . Coenzyme Q10 (CO Q 10) 10 MG CAPS Take 1 capsule by mouth daily.   12/18/2014 at Unknown time  . diclofenac sodium (VOLTAREN) 1 % GEL Apply 4 g topically 4 (four) times daily. 100 g 0 12/18/2014 at Unknown time  . esomeprazole (NEXIUM) 40 MG capsule Take 40 mg by mouth 2 (two) times daily before a meal.   12/18/2014 at Unknown time  . fluticasone (FLONASE) 50 MCG/ACT nasal spray Place 2 sprays into both nostrils daily. 16 g 5 12/18/2014 at Unknown time  . gabapentin (NEURONTIN) 300 MG capsule Take 300 mg by mouth at bedtime.   12/17/2014 at Unknown time  . glimepiride (AMARYL) 1 MG tablet Take 1 mg by mouth daily with breakfast.   12/18/2014 at Unknown time  . guaiFENesin (MUCINEX) 600 MG 12 hr tablet Take 2 tablets (1,200 mg total) by mouth 2 (two) times daily as needed for to loosen phlegm. 60 tablet 0 12/18/2014 at Unknown time  . insulin aspart (NOVOLOG FLEXPEN) 100 UNIT/ML FlexPen Use as directed per sliding scale indicated by your physician. (Patient taking differently: Inject 0-11 Units into the skin 3 (three) times daily with meals. Use as directed per sliding scale  indicated by your physician.) 21 mL 5 12/18/2014 at Unknown time  . Insulin Glargine (LANTUS SOLOSTAR) 100 UNIT/ML Solostar Pen Inject 30 Units into the skin 2 (two) times daily. 15 mL 3 12/18/2014 at Unknown time  . Multiple Vitamin (MULTIVITAMIN) capsule Take 1 capsule by mouth daily.   12/18/2014 at Unknown  time  . omega-3 fish oil (MAXEPA) 1000 MG CAPS capsule Take 1 capsule by mouth daily.   12/18/2014 at Unknown time  . Oxycodone HCl 10 MG TABS Take 1 tablet (10 mg total) by mouth every 6 (six) hours as needed. 10 tablet 0 12/18/2014 at Unknown time  . potassium chloride SA (K-DUR,KLOR-CON) 20 MEQ tablet Take 2 tablets (40 mEq total) by mouth 2 (two) times daily. 180 tablet 1 12/18/2014 at Unknown time  . PROAIR HFA 108 (90 BASE) MCG/ACT inhaler INHALE 2 PUFFS BY MOUTH EVERY 6 HOURS AS NEEDED 8.5 g 1 12/18/2014 at Unknown time  . SPIRIVA HANDIHALER 18 MCG inhalation capsule INHALE CONTENTS OF 1 CAPSULE USING HANDIHALER EVERY DAY 30 capsule 0 12/18/2014 at Unknown time  . SYMBICORT 160-4.5 MCG/ACT inhaler INHALE 2 PUFFS BY MOUTH TWICE DAILY 10.2 g 3 12/18/2014 at Unknown time  . tiZANidine (ZANAFLEX) 4 MG tablet TAKE 1 TABLET BY MOUTH EVERY 8 HOURS AS NEEDED FOR MUSCLE SPASMS 30 tablet 0 Past Week at Unknown time  . torsemide (DEMADEX) 100 MG tablet Take 1 tablet (100 mg total) by mouth 2 (two) times daily. 60 tablet 1 Past Week at Unknown time  . varenicline (CHANTIX) 0.5 MG tablet Take 1 tablet (0.5 mg total) by mouth 2 (two) times daily. 60 tablet 0 12/18/2014 at Unknown time  . venlafaxine XR (EFFEXOR XR) 37.5 MG 24 hr capsule Take 1 capsule (37.5 mg total) by mouth daily with breakfast. 30 capsule 3 12/18/2014 at Unknown time  . hydrOXYzine (ATARAX/VISTARIL) 10 MG tablet TAKE 1 TABLET BY MOUTH THREE TIMES DAILY AS NEEDED FOR ITCHING 30 tablet 11 not started yet  . ibuprofen (ADVIL,MOTRIN) 400 MG tablet Take 1 tablet (400 mg total) by mouth every 6 (six) hours as needed for moderate pain.   not started  yet   Assessment: 49 y/o obese female with respiratory failure due to CHF and OHS/OSA. Also has severe COPD and pulmonary HTN. She is drowsy and uncooperative this morning after possibly injecting home narcotic and BZD overnight. Pharmacy consulted to begin Unasyn for aspiration PNA. She is currently afebrile and WBC are normal. SCr is 1.27 and appears to be trending back down to normal. She has no cultures pending.  Goal of Therapy:  Eradication of infection  Plan:  - Unasyn 3 g IV q6h - Monitor renal function and clinical course - Has tolerated PCN medications in the past  [facility], Hines.D., BCPS Clinical Pharmacist Pager: [phone] 12/23/2014 10:52 AM

## 2014-12-23 NOTE — Progress Notes (Signed)
Inpatient Diabetes Program Recommendations  AACE/ADA: New Consensus Statement on Inpatient Glycemic Control (2013)  Target Ranges:  Prepandial:   less than 140 mg/dL      Peak postprandial:   less than 180 mg/dL (1-2 hours)      Critically ill patients:  140 - 180 mg/dL   Results for [name], [name] (MRN [id number]) as of 12/23/2014 10:05  Ref. Range 12/22/2014 07:29 12/22/2014 12:13 12/22/2014 17:03 12/22/2014 21:15 12/23/2014 07:58  Glucose-Capillary Latest Range: 70-99 mg/dL 277 (H) 279 (H) 311 (H) 293 (H) 228 (H)   Diabetes history: DM2 Outpatient Diabetes medications: Lantus 30 units BID, Amaryl 1 mg QAM, Novolog 0-11 units TID with meals Current orders for Inpatient glycemic control:Lantus 35 units BID, Novolog 0-15 units TID with meals, Novolog 05 units HS  Inpatient Diabetes Program Recommendations Insulin - Basal: If steroids continued as ordered, please consider increasing Lantus to 38 units BID. Correction (SSI): Please consider increasing Novolog correction to Resistant scale. Insulin - Meal Coverage: If steroids continued as ordered, please consider ordering Novolog 5 units TID with meals for meal coverage (in addition to Novolog correction scale).  Thanks, Barnie Alderman, RN, MSN, CCRN, CDE Diabetes Coordinator Inpatient Diabetes Program [phone] (Team Pager from [place] to [place]) [phone] ([place] office) [phone] [facility] office)

## 2014-12-23 NOTE — Progress Notes (Signed)
Physical Therapy Treatment Patient Details Name: [name] MRN: [id number] DOB: [birth year] Today's Date: 12/23/2014    History of Present Illness Patient is a 49 yo female admitted 12/18/14 with acute on chronic resp failure, CHF.  Patient recently admitted 3/16 - 3/25 with pna, COPD, CHF (just d/c on day of admit).  PMH:  asthma, obesity, COPD, CHF, DM, depression, anxiety, HTN, fibromyalgia, polysubstance abuse, OSA on CPAP    PT Comments    Pt continues to have a lot of difficulty maintaining adequate saturations during mobility at 6-8L.  She as expected fatigues quickly, needing several standing rests and needs some coaching for more efficient breathing techniques.   Follow Up Recommendations  Supervision - Intermittent;No PT follow up     Equipment Recommendations  None recommended by PT    Recommendations for Other Services       Precautions / Restrictions Precautions Precautions: Fall    Mobility  Bed Mobility               General bed mobility comments: up in the chair  Transfers Overall transfer level: Needs assistance Equipment used: Rolling walker (2 wheeled);None Transfers: Sit to/from Stand Sit to Stand: Supervision         General transfer comment: safe transition to stand without assist  Ambulation/Gait Ambulation/Gait assistance: Supervision Ambulation Distance (Feet): 150 Feet Assistive device: Rolling walker (2 wheeled) Gait Pattern/deviations: Step-through pattern Gait velocity: decreased   General Gait Details: 2 standing rest breaks down on elbows at times.  desaturation to 84% on 6L and to 86% on 8L during gait.  At rest after sitting 2 minutes EOB on 5L sats rose to 91%   Stairs            Wheelchair Mobility    Modified Rankin (Stroke Patients Only)       Balance Overall balance assessment: Needs assistance Sitting-balance support: No upper extremity supported Sitting balance-Leahy Scale: Good     Standing  balance support: No upper extremity supported Standing balance-Leahy Scale: Fair                      Cognition Arousal/Alertness: Awake/alert Behavior During Therapy: WFL for tasks assessed/performed;Anxious Overall Cognitive Status: Within Functional Limits for tasks assessed                      Exercises      General Comments General comments (skin integrity, edema, etc.): Reinforced efficient breathing technique.      Pertinent Vitals/Pain Pain Assessment: Faces Faces Pain Scale: Hurts a little bit Pain Location: vague pains Pain Descriptors / Indicators: Grimacing Pain Intervention(s): Monitored during session    Home Living                      Prior Function            PT Goals (current goals can now be found in the care plan section) Acute Rehab PT Goals Patient Stated Goal: to go home  PT Goal Formulation: With patient Time For Goal Achievement: 12/26/14 Potential to Achieve Goals: Good Progress towards PT goals: Progressing toward goals    Frequency  Min 3X/week    PT Plan Current plan remains appropriate    Co-evaluation             End of Session Equipment Utilized During Treatment: Oxygen Activity Tolerance: Patient tolerated treatment well;Patient limited by fatigue Patient left: Other (comment) (sitting EOB for  breathing treatment)     Time: 5625-6389 PT Time Calculation (min) (ACUTE ONLY): 20 min  Charges:  $Gait Training: 8-22 mins                    G Codes:  Functional Limitation: Mobility: Walking and moving around   [facility] 12/23/2014, 4:49 PM 12/23/2014  Donnella Sham, [place] [phone]  (pager)

## 2014-12-23 NOTE — Progress Notes (Signed)
Dr. Candiss Norse requested pt sit up in chair this morning.  Made several attempts to get the patient up to the chair, pt stated "Alright, I heard you," but swatted at RN's hand when trying to wake her up.  Pt drowsy, but follows commands.  Will continue to make attempts to get her to the chair this morning.

## 2014-12-23 NOTE — Progress Notes (Signed)
Subjective: Concern overnight for pt ?injecting medications?  RN found IV tubing with white sediment in it, pts purse found with 2 syringes also with white sediment and bottles of narcs. Refused CPAP. Very drowsy and uncooperative this am.     Objective: Vital signs in last 24 hours: Blood pressure 128/75, pulse 98, temperature 98 F (36.7 C), temperature source Oral, resp. rate 18, height 5' 8"  (1.727 m), weight 300 lb 14.9 oz (136.5 kg), last menstrual period 10/16/2012, SpO2 94 %.  Intake/Output from previous day: 03/29 0701 - 03/30 0700 In: 1991.7 [P.O.:150; I.V.:1841.7] Out: 1150 [Urine:1150]   Physical Exam:  General: Morbidly obese female in bed  Neuro: Lethargic, arouses to loud voice and answers some questions but falls quickly back to sleep  CV: distant PULM: resps even, mildly labored, prolonged exhalation, decreased bs, faint bibasilar crackles, no wheezing GI: Abd soft, NT/ND Extremities: LE with edema, no cyanosis    Lab Results:  Recent Labs  12/21/14 0423  WBC 8.1  HGB 13.3  HCT 41.9  PLT 127*   BMET  Recent Labs  12/21/14 0423 12/22/14 0526 12/23/14 0502  NA 138 129* 131*  K 3.6 4.9 4.3  CL 94* 91* 94*  CO2 35* 28 27  GLUCOSE 161* 310* 226*  BUN 17 28* 26*  CREATININE 1.16* 1.79* 1.27*  CALCIUM 8.0* 9.0 9.0   Echo 12/21/14 >>>  EF 65 - 70%, grade 1 DD, mild MR, mod dilated RV and RA, severe PAH with PAP 106   Assessment/Plan:  Acute on chronic respiratory failure -- most likely related to decompensated diastolic heart failure, decompensated OHS/OSA.  Continues to have relatively high O2 needs.  Her CT chest shows GGO that are worse than her prior in 12/2013, and it is unclear if this is due to her edema, ?? Sarcoid (we have no documentation for this past diagnosis done out of state), or possibly a smoking related disease such as DIP?? (she did quit smoking in 10/2014). 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   ESR/ACE levels nml, BNP low 3/30 - refused CPAP overnight and ?? Taking/Injecting sedating medications.  Increased lethargy.  Her infiltrates may be related to inhaled or injected drugs and lung injury Recs: Stat ABG  Likely needs tx SDU for bipap  Diuresis on hold due to bump in SCr - continue once able. F/u O2 requirements for improvement. Empiric trial of solumedrol x 2-3 days- run some risk of worsening her glucose control. May have to consider bronchoscopy once O2 needs decline. ?empiric abx with progression on most recent CT 3/30  Severe COPD with no acute bronchospasm on exam Recs: Continue supplemental O2. Continue BD's. Continue smoking cessation (states she quit 10/2014). 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 Recs: Prevent hypoxemia and maintain SpO2 > 90% at all times. Continue diuresis as BP and SCr permit. Suspect that there would be little benefit from trial of advanced therapy (Sildenafil, Ambrisentan, Nifedipine, etc). Management of her COPD, diastolic dysfxn will be key here, as will treatment of possible interstitial lung disease if  it exists (curently on steroid trial). If we do decide to try an agent, selection will be complicated. The ERA's have been shown to be most beneficial in auto-immune inflammatory diseases, while the sildenafil would likely be the choice if L sided CHF is the main culprit contributor.  Weight loss encouraged.   ?pneumobilia -- seen on CT chest.  Recs:  ?abd u/s, further w/u  Defer to [place], NP 12/23/2014  9:24 AM Pager: [phone]) [phone] or (336)  [phone]   Attending Note:  I have examined patient, reviewed labs, studies and notes. I have discussed the case with Shon Millet, and I agree with the data and plans as amended above.  Baltazar Apo, MD, PhD 12/23/2014, 4:48 PM [facility] [phone] or if no answer [phone]

## 2014-12-23 NOTE — Progress Notes (Signed)
Patient Demographics  [name], is a 49 y.o. female, DOB - 1965/11/07, POE:[id number]  Admit date - 12/18/2014   Admitting Physician Ivor Costa, MD  Outpatient Primary MD for the patient is Birdie Riddle, MD  LOS - 5   Chief Complaint  Patient presents with  . Shortness of Breath        Subjective:   [name] today has, No headache, No chest pain, No abdominal pain - No Nausea, No new weakness tingling or numbness, No Cough - Improved SOB.    Assessment & Plan    1. Acute on chronic respiratory failure. On home oxygen, history of advanced COPD, obesity hypoventilation syndrome, obstructive sleep apnea along with chronic diastolic CHF with EF 31%.  She is on 4 L nasal cannula oxygen but is worse and requiring 6-8 L, despite aggressive diuresis shortness of breath has not resolved, pulmonary has been following, now on a trial of steroids. 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 Echogram otherwise showing only mild grade 1 diastolic dysfunction. Further management per pulmonary.   Filed Weights   12/21/14 0448 12/22/14 0619 12/23/14 0641  Weight: 135.1 kg (297 lb 13.5 oz) 134.2 kg (295 lb 13.7 oz) 136.5 kg (300 lb 14.9 oz)      2.DM2 -  as she is on steroid will increase Lantus, added sliding scale along with pre-meal NovoLog. Will monitor CBGs.  Lab Results  Component Value Date   HGBA1C 9.4* 12/19/2014    CBG (last 3)   Recent Labs  12/22/14 1703 12/22/14 2115 12/23/14 0758  GLUCAP 311* 293* 228*     3. GERD. Continue PPI.   4. Hyperkalemia. after Kayexalate and diuresis.   5. History of smoking. Counseled to quit.   6. Thrombocytopenia. HRT was checked recently  negative, no evidence of bleeding. We will monitor.   7. Alcohol abuse. CIWA protocol.   8. Anxiety. As needed Xanax.   9. Hyponatremia with ARF. Due to overdiuresis, she is dehydrated, diuretics have been stopped improving with IV fluids.    10. Aspiration HCAP. Due to abusing narcotics in the hospital, she was caught injecting crushed narcotic pills into her IV site on 12/22/2014, she subsequently vomited, on CT scan likely aspiration. We will place her on Unasyn and monitor. Has been counseled not to abuse narcotics.      Code Status: Full  Family Communication: None  Disposition Plan:  Home   Procedures   TTE  12-21-14  - Left ventricle: The cavity size was normal. Wall thickness was increased in a pattern of moderate LVH. Systolic function was vigorous. The estimated ejection fraction was in the range of 65%to 70%. Doppler parameters are consistent with abnormal left ventricular relaxation (grade 1 diastolic dysfunction). Doppler parameters are consistent with high ventricular filling pressure. - Ventricular septum: The contour showed diastolic flattening and systolic flattening. - Mitral valve: There was mild regurgitation. - Right ventricle: The cavity size was moderately dilated. Systolic function was severely reduced. - Right atrium: The atrium was mildly dilated. - Atrial septum: The septum bowed from right to left, consistent with increased right atrial pressure. - Pulmonary arteries: Systolic pressure was severely  increased. PA peak pressure: 106 mm Hg (S).  Impressions:  Normal LV function; grade 1 diastolic dysfunction; RAE/RVE; severely reduced RV function; severely elevated pulmonary pressure.    CTA Chest - no PE, diffuse groundglass pattern   TTE April 2015  - Left ventricle: The cavity size was normal. There was moderate concentric hypertrophy. Systolic function wasnormal. The estimated ejection fraction was in the range of 55% to 60%. Although  no diagnostic regional wall motionabnormality was identified, this possibility cannot becompletely excluded on the basis of this study. Doppler parameters are consistent with abnormal left ventricularrelaxation (grade 1 diastolic dysfunction). - Pulmonary arteries: PA peak pressure: 73mm Hg (S).   Consults  PCCM   Medications  Scheduled Meds: . ALPRAZolam  0.5 mg Oral TID  . antiseptic oral rinse  7 mL Mouth Rinse q12n4p  . aspirin EC  81 mg Oral Daily  . dextromethorphan-guaiFENesin  1 tablet Oral BID  . diclofenac sodium  4 g Topical QID  . fluticasone  2 spray Each Nare Daily  . folic acid  1 mg Oral Daily  . gabapentin  300 mg Oral QHS  . insulin aspart  0-15 Units Subcutaneous TID WC  . insulin aspart  0-5 Units Subcutaneous QHS  . insulin glargine  35 Units Subcutaneous BID  . ipratropium-albuterol  3 mL Nebulization QID  . loratadine  10 mg Oral Daily  . methylPREDNISolone (SOLU-MEDROL) injection  40 mg Intravenous Q12H  . multivitamin with minerals  1 tablet Oral Daily  . omega-3 acid ethyl esters  1 capsule Oral Daily  . pantoprazole  40 mg Oral Daily  . sodium chloride  3 mL Intravenous Q12H  . thiamine  100 mg Oral Daily  . varenicline  0.5 mg Oral BID WC  . venlafaxine XR  37.5 mg Oral Q breakfast   Continuous Infusions: . sodium chloride 100 mL/hr at 12/23/14 0832   PRN Meds:.acetaminophen, guaiFENesin, hydrOXYzine, levalbuterol, [DISCONTINUED] ondansetron **OR** ondansetron (ZOFRAN) IV, oxyCODONE, tiZANidine  DVT Prophylaxis  SCD due to thrombocytopenia  Lab Results  Component Value Date   PLT 127* 12/21/2014    Antibiotics     Anti-infectives    None          Objective:   Filed Vitals:   12/23/14 0127 12/23/14 0639 12/23/14 0641 12/23/14 0938  BP:  128/75    Pulse: 98     Temp:  98 F (36.7 C)    TempSrc:      Resp:  18    Height:      Weight:   136.5 kg (300 lb 14.9 oz)   SpO2: 95% 94%  93%    Wt Readings from Last 3  Encounters:  12/23/14 136.5 kg (300 lb 14.9 oz)  12/06/14 134.265 kg (296 lb)  11/18/14 137.077 kg (302 lb 3.2 oz)     Intake/Output Summary (Last 24 hours) at 12/23/14 1044 Last data filed at 12/23/14 4128  Gross per 24 hour  Intake 1991.67 ml  Output   1150 ml  Net 841.67 ml     Physical Exam  Awake Alert, Oriented X 3, No new F.N deficits, Normal affect [place].AT,PERRAL Supple Neck,No JVD, No cervical lymphadenopathy appriciated.  Symmetrical Chest wall movement, Good air movement bilaterally, diffuse bilateral crackles RRR,No Gallops,Rubs or new Murmurs, No Parasternal Heave +ve B.Sounds, Abd Soft, No tenderness, No organomegaly appriciated, No rebound - guarding or rigidity. No Cyanosis, Clubbing or edema, No new Rash or bruise     Data Review  Micro Results Recent Results (from the past 240 hour(s))  MRSA PCR Screening     Status: Abnormal   Collection Time: 12/14/14  9:30 AM  Result Value Ref Range Status   MRSA by PCR POSITIVE (A) NEGATIVE Final    Comment:        The GeneXpert MRSA Assay (FDA approved for NASAL specimens only), is one component of a comprehensive MRSA colonization surveillance program. It is not intended to diagnose MRSA infection nor to guide or monitor treatment for MRSA infections. RESULT CALLED TO, READ BACK BY AND VERIFIED WITH: MAIN,S @ 4166 ON [id number] BY POTEAT,S     Radiology Reports Dg Chest 2 View  12/13/2014   CLINICAL DATA:  Weakness and shortness of Breath  EXAM: CHEST  2 VIEW  COMPARISON:  12/11/2014  FINDINGS: Cardiac shadow is mildly enlarged. Patchy changes are noted in the bases bilaterally stable from the prior exam. No new focal infiltrate is seen. No sizable effusion is noted.  IMPRESSION: Patchy bibasilar changes.  No acute abnormality is noted.   Electronically Signed   By: [name] M.D.   On: 12/13/2014 11:07   Dg Chest 2 View  12/11/2014   CLINICAL DATA:  Short of breath.  History pneumonia.  EXAM: CHEST  2 VIEW   COMPARISON:  12/09/2014  FINDINGS: Chronic lung disease with hyperinflation and bibasilar scarring. Prominent lung markings in the bases are stable with prior studies  Cardiac enlargement with pulmonary vascular congestion. Findings are consistent with mild fluid overload without edema or effusion. No definite pneumonia.  IMPRESSION: Chronic lung disease bibasilar scarring which appears stable  Cardiac enlargement with mild vascular congestion suggesting mild fluid overload.  No change from the prior study.   Electronically Signed   By: Franchot Gallo M.D.   On: 12/11/2014 10:36   Dg Chest 2 View (if Patient Has Fever And/or Copd)  12/09/2014   CLINICAL DATA:  Acute onset of severe shortness of breath. Initial encounter.  EXAM: CHEST  2 VIEW  COMPARISON:  Chest radiograph performed 10/31/2014  FINDINGS: The lungs are well-aerated. Vascular congestion is noted. Bilateral central airspace opacities raise concern for pulmonary edema, superimposed on chronic lung changes as previously noted. Mild pneumonia might have a similar appearance. There is no evidence of pleural effusion or pneumothorax.  The heart is borderline normal in size. No acute osseous abnormalities are seen.  IMPRESSION: Vascular congestion noted. Bilateral central airspace opacities raise concern for mild pulmonary edema, superimposed on chronic lung changes. Mild pneumonia might have a similar appearance.   Electronically Signed   By: Garald Balding M.D.   On: 12/09/2014 04:22   Ct Angio Chest Pe W/cm &/or Wo Cm  12/18/2014   CLINICAL DATA:  Shortness breath, chest pain. History of sarcoidosis, COPD.  EXAM: CT ANGIOGRAPHY CHEST WITH CONTRAST  TECHNIQUE: Multidetector CT imaging of the chest was performed using the standard protocol during bolus administration of intravenous contrast. Multiplanar CT image reconstructions and MIPs were obtained to evaluate the vascular anatomy.  CONTRAST:  17mL OMNIPAQUE IOHEXOL 350 MG/ML SOLN  COMPARISON:   08/04/2013  FINDINGS: No filling defects in the pulmonary arteries to suggest pulmonary emboli. The distal vessels are not well visualized due to respiratory motion. Mild cardiomegaly. Aorta is normal caliber. Pulmonary artery remains dilated measuring 36 mm compared to 38 mm previously. Central pulmonary arteries appear prominent. Findings suggest pulmonary arterial hypertension.  Mild emphysema/COPD. Diffuse ground-glass opacities throughout the lungs are stable since prior study. No pleural  effusions. Chronic bibasilar densities likely reflect scarring. No pleural effusions.  Small scattered borderline sized mediastinal and bilateral hilar lymph nodes, similar to prior study. No axillary adenopathy.  Chest wall soft tissues are unremarkable. Imaging into the upper abdomen demonstrates diffuse fatty infiltration of the liver. No acute findings.  No acute bony abnormality or focal bone lesion.  Review of the MIP images confirms the above findings.  IMPRESSION: No evidence of pulmonary embolus. The distal peripheral vessels are not well visualized due to respiratory motion.  Borderline heart size. Stable dilatation of the pulmonary arteries compatible with pulmonary arterial hypertension.  Underline COPD. Stable diffuse ground-glass opacities throughout the lungs and bibasilar scarring.   Electronically Signed   By: Rolm Baptise M.D.   On: 12/18/2014 23:55   Ct Chest High Resolution  12/23/2014   CLINICAL DATA:  49 year old female with lung infiltrates. 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  EXAM: CT CHEST WITHOUT CONTRAST  TECHNIQUE: Multidetector CT imaging of the chest was performed following the standard protocol without intravenous contrast. High resolution imaging of the lungs, as well as inspiratory and expiratory imaging, was performed.  COMPARISON:  Chest CT 12/18/2014.  FINDINGS: Mediastinum/Lymph Nodes: Heart size is mildly enlarged. There is no significant pericardial fluid, thickening or pericardial  calcification. There is atherosclerosis of the thoracic aorta, the great vessels of the mediastinum and the coronary arteries, including calcified atherosclerotic plaque in the left anterior descending coronary artery. Dilatation of the pulmonic trunk (4.4 cm in diameter), suggesting pulmonary arterial hypertension. Multiple borderline enlarged and minimally enlarged mediastinal and bilateral hilar lymph nodes, measuring up to 1 cm in short axis in the right paratracheal nodal station. Esophagus is unremarkable in appearance. No axillary lymphadenopathy.  Lungs/Pleura: The appearance of the lungs is again very unusual, with areas of moderate to severe centrilobular and paraseptal emphysema scattered in a patchy distribution throughout the lungs, with areas of significant involvement, and other areas of near complete sparing. There continues to be extensive thickening of the peribronchovascular interstitium, which is mild in some places, and very severe in other places. The most severely thickened areas of peribronchovascular interstitium remain in the lung bases, where there is some associated mild cylindrical bronchiectasis, these findings have been present on prior studies dating back to 04/27/2010, albeit more severe on the more recent prior examinations than the remote prior studies. There are a few new nodular appearing areas of airspace consolidation in the lungs bilaterally compared to the most recent prior study from 12/18/2014, presumably infectious or inflammatory. These are within the right lower lobe (images 22 and 26 of series 4) and the left lower lobe (images 32 and 38 of series 4). High-resolution images redemonstrate the above findings, and otherwise demonstrate no significant regions of peripheral subpleural reticulation or frank honeycombing. Inspiratory and expiratory imaging is unremarkable. No pleural effusions.  Upper Abdomen: Diffuse low-attenuation in the hepatic parenchyma, compatible with  hepatic steatosis. Two tiny locules of gas are noted within the liver in segment 4 on images 56 and 57 of series 3, favored to be pneumobilia.  Musculoskeletal/Soft Tissues: Old healed fractures of the lateral aspects of the right seventh ninth and tenth ribs are noted. There are no aggressive appearing lytic or blastic lesions noted in the visualized portions of the skeleton.  IMPRESSION: 1. 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 The pattern is somewhat unusual, and has slowly progressed when compared to remote prior studies dating back to 04/27/2010.  Today's examination does demonstrate four new areas of nodular airspace consolidation in the lower lobes of the lungs bilaterally (detailed above), which have developed in the past 3 days, presumably infectious or inflammatory in etiology. 2. Dilatation of the pulmonic trunk, suggestive of pulmonary arterial hypertension. 3. Mild cardiomegaly. 4. 2 tiny locules of gas in segment 4 of the liver, presumably pneumobilia. However, the liver is incompletely visualized. Clinical correlation is recommended. If patient has had prior sphincterotomy, no further workup or evaluation may be warranted. In the absence of history of prior sphincterotomy or other surgical intervention of the biliary tract, clinical correlation for signs and symptoms of ascending cholangitis would be recommended. 5. Hepatic steatosis.   Electronically Signed   By: [name] M.D.   On: 12/23/2014 08:46   Nm Pulmonary Perf And Vent  12/17/2014   CLINICAL DATA:  49 year old female with shortness of breath and chest discomfort. Initial encounter.  EXAM: NUCLEAR MEDICINE VENTILATION - PERFUSION LUNG SCAN  TECHNIQUE: Ventilation images were obtained in multiple projections using inhaled aerosol technetium 99 M DTPA. Perfusion images were obtained in multiple projections after intravenous injection of  Tc-24m MAA.  RADIOPHARMACEUTICALS:  44.0 mCi Tc-48m DTPA aerosol and 5 point for mCi Tc-14m MAA  COMPARISON:  Portable chest radiograph 12/16/2014.  FINDINGS: Ventilation: Is heterogeneous bilaterally, in part due to central clumping of the ventilation agent. Scattered bilateral peripheral ventilation defects.  Perfusion: Scattered bilateral small and peripheral perfusion defects, but appear matched in both lungs.  IMPRESSION: Abnormal scan with matched bilateral pulmonary ventilation and perfusion defects that could reflect chronic obstructed pulmonary disease or chronic pulmonary embolus.  No scintigraphic evidence of acute PE.   Electronically Signed   By: Genevie Ann M.D.   On: 12/17/2014 13:05   Dg Chest Port 1 View  12/21/2014   CLINICAL DATA:  Respiratory failure  EXAM: PORTABLE CHEST - 1 VIEW  COMPARISON:  Chest x-ray and CT scan of the chest of December 18, 2014  FINDINGS: The lungs are well-expanded. The pulmonary interstitial markings remain increased but have improved slightly. There is subsegmental atelectasis at the right lung base that is stable. No significant pleural effusion is observed. The cardiac silhouette remains enlarged. The central pulmonary vascularity remains prominent but is less engorged today.  IMPRESSION: COPD. Mild improvement in interstitial edema secondary to CHF. There is persistent right basilar atelectasis or pneumonia.   Electronically Signed   By: David  Martinique   On: 12/21/2014 07:29   Dg Chest Portable 1 View  12/18/2014   CLINICAL DATA:  Shortness of Breath, acute  EXAM: PORTABLE CHEST - 1 VIEW  COMPARISON:  December 16, 2014  FINDINGS: There is interstitial edema superimposed on emphysematous change and bibasilar scarring. Heart is mildly enlarged with pulmonary venous hypertension. No adenopathy. No bone lesions.  IMPRESSION: Congestive heart failure superimposed on emphysematous change. No airspace consolidation.   Electronically Signed   By: Lowella Grip III M.D.   On:  12/18/2014 19:20   Dg Chest Port 1 View  12/16/2014   CLINICAL DATA:  Shortness of breath, respiratory failure, CHF and pulmonary infiltrates.  EXAM: PORTABLE CHEST - 1 VIEW  COMPARISON:  12/14/2014  FINDINGS: Bibasilar opacities, right greater than left are favored to largely represent chronic areas of scarring and atelectasis when reviewing prior chest x-rays. No progressive pulmonary airspace consolidation is identified. There is no evidence of overt pulmonary edema or pleural fluid. No pneumothorax is identified. The heart size is stable and normal.  IMPRESSION:  Remaining bibasilar opacities, right greater than left, are favored to represent chronic areas of scarring and atelectasis.   Electronically Signed   By: Aletta Edouard M.D.   On: 12/16/2014 11:38   Dg Chest Port 1 View  12/14/2014   CLINICAL DATA:  Shortness of breath and chest pain  EXAM: PORTABLE CHEST - 1 VIEW  COMPARISON:  12/13/2014  FINDINGS: Cardiac shadow remains enlarged. Patchy infiltrate is noted in the right lung base. Some diffuse interstitial changes are noted bilaterally which may be related to superimposed congestive failure. This is more prominent than that seen on the prior exam.  IMPRESSION: Stable patchy right basilar infiltrate.  Increasing vascular congestion and interstitial edema.   Electronically Signed   By: [name] M.D.   On: 12/14/2014 10:57     CBC  Recent Labs Lab 12/17/14 0550 12/18/14 1846 12/20/14 0325 12/21/14 0423  WBC 9.2 12.2* 15.4* 8.1  HGB 14.3 13.8 13.4 13.3  HCT 43.9 41.8 42.0 41.9  PLT 106* 88* 133* 127*  MCV 81.6 80.4 82.5 83.1  MCH 26.6 26.5 26.3 26.4  MCHC 32.6 33.0 31.9 31.7  RDW 19.5* 19.6* 19.7* 19.9*    Chemistries   Recent Labs Lab 12/18/14 1846 12/19/14 0700 12/20/14 0325 12/21/14 0423 12/22/14 0526 12/23/14 0502  NA 130* 132* 136 138 129* 131*  K 4.9 5.8* 3.3* 3.6 4.9 4.3  CL 97 99 96 94* 91* 94*  CO2 24 24 30  35* 28 27  GLUCOSE 244* 373* 180* 161* 310*  226*  BUN 14 18 16 17  28* 26*  CREATININE 0.96 1.07 0.91 1.16* 1.79* 1.27*  CALCIUM 9.0 8.7 7.8* 8.0* 9.0 9.0  AST 56* 30  --   --   --   --   ALT 63* 57*  --   --   --   --   ALKPHOS 106 96  --   --   --   --   BILITOT 2.0* 1.2  --   --   --   --    ------------------------------------------------------------------------------------------------------------------ estimated creatinine clearance is 80.3 mL/min (by C-G formula based on Cr of 1.27). ------------------------------------------------------------------------------------------------------------------ No results for input(s): HGBA1C in the last 72 hours. ------------------------------------------------------------------------------------------------------------------ No results for input(s): CHOL, HDL, LDLCALC, TRIG, CHOLHDL, LDLDIRECT in the last 72 hours. ------------------------------------------------------------------------------------------------------------------ No results for input(s): TSH, T4TOTAL, T3FREE, THYROIDAB in the last 72 hours.  Invalid input(s): FREET3 ------------------------------------------------------------------------------------------------------------------ No results for input(s): VITAMINB12, FOLATE, FERRITIN, TIBC, IRON, RETICCTPCT in the last 72 hours.  Coagulation profile  Recent Labs Lab 12/19/14 0115  INR 1.03    No results for input(s): DDIMER in the last 72 hours.  Cardiac Enzymes  Recent Labs Lab 12/19/14 0115 12/19/14 0700 12/19/14 1215  TROPONINI 0.05* <0.03 0.06*   ------------------------------------------------------------------------------------------------------------------ Invalid input(s): POCBNP     Time Spent in minutes   35   Jannessa Ogden K M.D on 12/23/2014 at 10:44 AM  Between 7am to 7pm - Pager - [phone]  After 7pm go to [email or web address] - password [facility]  [facility]   Office  [phone]

## 2014-12-23 NOTE — Progress Notes (Addendum)
Pt's IV has been beeping throughout the night. Went into pt's room at 0130 to give Solumedrol as ordered. Pt's IV tubing has white particles in it.  Disconnected IV tubing and gave solumedrol through IV site. Nurse and Juanetta Beets, charge nurse went in to room to talk with pt about the situation. Pt denied injecting any medications through IV tubing. RN educated pt on safety of not messing with IV line.  Instructed pt that we will have to search her belongings. Pt agreeable to nurse and charge nurse checking her belongings. Nurse and charge nurse checked pt's pocketbook found 2 syringes with white particles in them, empty bottle of oxycodone, empty bottle of alprazolam, bottle of alprazolam 1mg , and spiriva. Told pt that we would have to take her medications to pharmacy to be locked up until d/c. Pt agreeable to locking medications up in pharmacy. Checked pt's O2 sat is 95% on 5 L of O2. Removed IV tubing from room. Will move pt to room closer to nurse's station. Pt alert and answering questions appropriately. Notified Schorr, NP of situation. Schorr, NP gave no new orders. Will continue to monitor pt. Ranelle Oyster, RN

## 2014-12-24 LAB — GLUCOSE, CAPILLARY
GLUCOSE-CAPILLARY: 337 mg/dL — AB (ref 70–99)
GLUCOSE-CAPILLARY: 288 mg/dL — AB (ref 70–99)
Glucose-Capillary: 254 mg/dL — ABNORMAL HIGH (ref 70–99)
Glucose-Capillary: 314 mg/dL — ABNORMAL HIGH (ref 70–99)
Glucose-Capillary: 234 mg/dL — ABNORMAL HIGH (ref 70–99)

## 2014-12-24 LAB — CBC
HEMATOCRIT: 38.9 % (ref 36.0–46.0)
Hemoglobin: 12.3 g/dL (ref 12.0–15.0)
MCH: 25.8 pg — ABNORMAL LOW (ref 26.0–34.0)
MCHC: 31.6 g/dL (ref 30.0–36.0)
MCV: 81.6 fL (ref 78.0–100.0)
Platelets: 145 10*3/uL — ABNORMAL LOW (ref 150–400)
RBC: 4.77 MIL/uL (ref 3.87–5.11)
RDW: 19 % — ABNORMAL HIGH (ref 11.5–15.5)
WBC: 12.4 10*3/uL — ABNORMAL HIGH (ref 4.0–10.5)

## 2014-12-24 LAB — BASIC METABOLIC PANEL
ANION GAP: 8 (ref 5–15)
BUN: 21 mg/dL (ref 6–23)
CHLORIDE: 96 mmol/L (ref 96–112)
CO2: 28 mmol/L (ref 19–32)
CREATININE: 0.88 mg/dL (ref 0.50–1.10)
Calcium: 9.2 mg/dL (ref 8.4–10.5)
GFR calc Af Amer: 89 mL/min — ABNORMAL LOW (ref >90)
GFR calc non Af Amer: 77 mL/min — ABNORMAL LOW (ref >90)
Glucose, Bld: 276 mg/dL — ABNORMAL HIGH (ref 70–99)
Potassium: 4.7 mmol/L (ref 3.5–5.1)
Sodium: 132 mmol/L — ABNORMAL LOW (ref 135–145)

## 2014-12-24 MED ORDER — INSULIN GLARGINE 100 UNIT/ML ~~LOC~~ SOLN
45.0000 [IU] | Freq: Two times a day (BID) | SUBCUTANEOUS | Status: DC
  Administered 2014-12-24 – 2014-12-26 (×5): 45 [IU] via SUBCUTANEOUS
  Filled 2014-12-24 (×6): qty 0.45

## 2014-12-24 MED ORDER — PREDNISONE 20 MG PO TABS
30.0000 mg | ORAL_TABLET | Freq: Every day | ORAL | Status: DC
  Administered 2014-12-25 – 2014-12-28 (×4): 30 mg via ORAL
  Filled 2014-12-24 (×5): qty 1

## 2014-12-24 MED ORDER — FUROSEMIDE 40 MG PO TABS
40.0000 mg | ORAL_TABLET | Freq: Once | ORAL | Status: AC
  Administered 2014-12-24: 40 mg via ORAL
  Filled 2014-12-24: qty 1

## 2014-12-24 NOTE — Progress Notes (Signed)
Subjective: No c/o.  Much more awake and alert.  Conversational.  Asking "what is in my lungs?".   Objective: Vital signs in last 24 hours: Blood pressure 131/69, pulse 87, temperature 97.8 F (36.6 C), temperature source Oral, resp. rate 20, height 5' 8"  (1.727 m), weight 304 lb 0.2 oz (137.9 kg), last menstrual period 10/16/2012, SpO2 98 %.  Intake/Output from previous day: 03/30 0701 - 03/31 0700 In: 1940 [P.O.:840; IV Piggyback:100] Out: 500 [Urine:500]   Physical Exam:  General: Morbidly obese female in bed  Neuro: awake, alert, appropriate, conversational, MAE  CV: distant PULM: resps even, non labored, decreased bs, faint bibasilar crackles, no wheezing GI: Abd soft, NT/ND Extremities: 1+ BLE edema (improved), no cyanosis   Lab Results:  Recent Labs  12/24/14 0540  WBC 12.4*  HGB 12.3  HCT 38.9  PLT 145*   BMET  Recent Labs  12/22/14 0526 12/23/14 0502 12/24/14 0540  NA 129* 131* 132*  K 4.9 4.3 4.7  CL 91* 94* 96  CO2 28 27 28   GLUCOSE 310* 226* 276*  BUN 28* 26* 21  CREATININE 1.79* 1.27* 0.88  CALCIUM 9.0 9.0 9.2   Echo 12/21/14 >>>  EF 65 - 70%, grade 1 DD, mild MR, mod dilated RV and RA, severe PAH with PAP 106   Assessment/Plan:  Acute on chronic respiratory failure -- most likely related to decompensated diastolic heart failure, decompensated OHS/OSA.  Continues to have relatively high O2 needs.  Her repeat CT chest shows GGO and some areas of nodularity that are progressed, and it is unclear if this is due to her edema, ?? Sarcoid (we have no documentation for this past diagnosis done out of state), or possibly a smoking related disease such as DIP?? (she did quit smoking in 10/2014). 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  ESR/ACE levels nml, BNP low 3/30 - refused CPAP overnight and ?? Taking/Injecting sedating medications.  Increased lethargy.  Her infiltrates may be related to inhaled or injected drugs and  lung injury Recs: Diuresis was on hold due to bump in SCr, should be able to restart 3/31 F/u O2 requirements for improvement. Empiric trial of solumedrol x 2-3 days- will change to PO 3/31, seems she has made some improvement.  May have to reconsider bronchoscopy once O2 needs decline, but may be lower yield after steroids Cont empiric abx - Unasyn with ??aspiration   Severe COPD with no acute bronchospasm on exam Recs: Continue supplemental O2 and wean as able -- wears 3-4L baseline Continue BD's. Continue smoking cessation (states she quit 10/2014). Cont chantix   OSA/OHS - decompensated  Recs: Cont qhs cpap  Avoid oversedation   Severe pulmonary hypertension with PAP 106 on echo 12/20/14 - likely combination of group 2 due to diastolic heart failure and group 3 due to severe COPD, OSA/OHS, restrictive lung disease due to obesity.  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 Recs: Prevent hypoxemia and maintain SpO2 > 90% at all times. Continue diuresis as BP and SCr permit. Suspect that there would be little benefit from trial of advanced therapy (Sildenafil, Ambrisentan, Nifedipine, etc). Management of her COPD, diastolic dysfxn will be key here, as will treatment of possible interstitial lung disease if it exists (curently on steroid trial).  Weight loss encouraged.   ?pneumobilia -- seen on CT chest.  Recs:  ?abd u/s, further w/u  Defer to [facility]  [name], NP 12/24/2014  9:56 AM Pager: [phone] or [phone]  Attending Note:  I have examined patient, reviewed labs, studies and notes. I have discussed the case with Shon Millet, and I agree with the data and plans as amended above.   Baltazar Apo, MD, PhD 12/24/2014, 11:27 AM [facility] [phone] or if no answer [phone]

## 2014-12-24 NOTE — Progress Notes (Signed)
Patient Demographics  [name], is a 49 y.o. female, DOB - [birth year], YOK:[id number]  Admit date - 12/18/2014   Admitting Physician Ivor Costa, MD  Outpatient Primary MD for the patient is Birdie Riddle, MD  LOS - 6   Chief Complaint  Patient presents with  . Shortness of Breath      Summary   49 y.o. female with past medical history of asthma, COPD, diastolic congestive heart failure (EF 55-60% with grade 1 diastolic dysfunction), diabetes mellitus, GERD, depression, anxiety disorder, who presents with worsening shortness of breath.  Patient was recently hospitalized from 3/16-3/25, and was treated for community acquired pneumonia, congestive heart failure exacerbation and COPD exacerbation. She completed 5 days of Levaquin treatment. Her flu PCR test was negative. She was just discharged home on 12/18/2014, she was admitted the same day with worsening respiratory failure.  [name] was consulted this admission and the initial opinion was patient was in fluid overload, she was diuresed, she developed acute renal failure and dehydration but her shortness of breath did not improve. 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 She was then placed on steroids with gradual improvement now. Still has significant oxygen demand and has room to improve. At baseline she is between 3-4 L nasal cannula oxygen currently requiring up to 8 L on ambulation.   Subjective:   [name] today has, No headache, No chest pain, No abdominal pain - No Nausea, No new weakness tingling or numbness, No Cough - Improved SOB.    Assessment & Plan    1. Acute on chronic respiratory failure. On home oxygen, history of advanced COPD, obesity hypoventilation syndrome, obstructive sleep apnea along with chronic  diastolic CHF with EF 39%. 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  She is on 4 L nasal cannula oxygen but is worse and requiring 6-8 L, despite aggressive diuresis shortness of breath has not resolved, pulmonary has been following, now on a trial of steroids. 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 Echogram otherwise showing only mild grade 1 diastolic dysfunction. Further management per pulmonary. She has shown some clinical improvement on steroids which will be continued.   Filed Weights   12/22/14 0619 12/23/14 0641 12/24/14 0500  Weight: 134.2 kg (295 lb 13.7 oz) 136.5 kg (300 lb 14.9 oz) 137.9 kg (304 lb 0.2 oz)      2.DM2 -  as she is on steroid will increase Lantus to 45 units twice a day on 12/24/2014, have already added sliding scale along with pre-meal NovoLog. Will monitor CBGs.  Lab Results  Component Value Date   HGBA1C 9.4* 12/19/2014    CBG (last 3)   Recent Labs  12/23/14 1721 12/23/14 2142 12/24/14 0800  GLUCAP 307* 337* 254*     3. GERD. Continue PPI.   4. Hyperkalemia. Has resolved after Kayexalate and diuresis.   5. History of smoking. Counseled to quit.   6. Thrombocytopenia. HRT was checked recently negative, no evidence of bleeding. We will monitor.   7. Alcohol abuse. CIWA protocol.   8. Anxiety. As needed Xanax.   9. Hyponatremia with ARF. Due to overdiuresis, she is dehydrated,  resolved after IV fluids, will resume diuretics on 12/25/2014.    10. Aspiration HCAP. Due to abusing narcotics in the hospital, she was caught injecting crushed narcotic pills into her IV site on 12/22/2014, she subsequently vomited, on CT scan likely aspiration. We will place her on Unasyn and monitor. Has been counseled not to abuse narcotics.      Code Status: Full  Family Communication: None  Disposition Plan:  Home   Procedures    TTE  12-21-14  - Left ventricle: The cavity size was normal. Wall thickness was increased in a pattern of moderate LVH. Systolic function was vigorous. The estimated ejection fraction was in the range of 65%to 70%. Doppler parameters are consistent with abnormal left ventricular relaxation (grade 1 diastolic dysfunction). Doppler parameters are consistent with high ventricular filling pressure. - Ventricular septum: The contour showed diastolic flattening and systolic flattening. - Mitral valve: There was mild regurgitation. - Right ventricle: The cavity size was moderately dilated. Systolic function was severely reduced. - Right atrium: The atrium was mildly dilated. - Atrial septum: The septum bowed from right to left, consistent with increased right atrial pressure. - Pulmonary arteries: Systolic pressure was severely increased. PA peak pressure: 106 mm Hg (S).  Impressions:  Normal LV function; grade 1 diastolic dysfunction; RAE/RVE; severely reduced RV function; severely elevated pulmonary pressure.    CTA Chest - no PE, diffuse groundglass pattern   TTE April 2015  - Left ventricle: The cavity size was normal. There was moderate concentric hypertrophy. Systolic function wasnormal. The estimated ejection fraction was in the range of 55% to 60%. Although no diagnostic regional wall motionabnormality was identified, this possibility cannot becompletely excluded on the basis of this study. Doppler parameters are consistent with abnormal left ventricularrelaxation (grade 1 diastolic dysfunction). - Pulmonary arteries: PA peak pressure: 46mm Hg (S).   Consults  PCCM   Medications  Scheduled Meds: . ALPRAZolam  0.5 mg Oral TID  . ampicillin-sulbactam (UNASYN) IV  3 g Intravenous Q6H  . antiseptic oral rinse  7 mL Mouth Rinse q12n4p  . aspirin EC  81 mg Oral Daily  . dextromethorphan-guaiFENesin  1 tablet Oral BID  . diclofenac sodium  4 g Topical QID  . fluticasone   2 spray Each Nare Daily  . folic acid  1 mg Oral Daily  . furosemide  40 mg Oral Once  . gabapentin  300 mg Oral QHS  . insulin aspart  0-15 Units Subcutaneous TID WC  . insulin aspart  0-5 Units Subcutaneous QHS  . insulin aspart  5 Units Subcutaneous TID WC  . insulin glargine  35 Units Subcutaneous BID  . ipratropium-albuterol  3 mL Nebulization QID  . loratadine  10 mg Oral Daily  . methylPREDNISolone (SOLU-MEDROL) injection  40 mg Intravenous Q12H  . multivitamin with minerals  1 tablet Oral Daily  . omega-3 acid ethyl esters  1 capsule Oral Daily  . pantoprazole  40 mg Oral Daily  . sodium chloride  3 mL Intravenous Q12H  . thiamine  100 mg Oral Daily  . varenicline  0.5 mg Oral BID WC  . venlafaxine XR  37.5 mg Oral Q breakfast   Continuous Infusions:   PRN Meds:.acetaminophen, guaiFENesin, hydrOXYzine, levalbuterol, [DISCONTINUED] ondansetron **OR** ondansetron (ZOFRAN) IV, oxyCODONE, tiZANidine  DVT Prophylaxis  SCD due to thrombocytopenia  Lab Results  Component Value Date   PLT 145* 12/24/2014    Antibiotics     Anti-infectives    Start  Dose/Rate Route Frequency Ordered Stop   12/23/14 1200  Ampicillin-Sulbactam (UNASYN) 3 g in sodium chloride 0.9 % 100 mL IVPB     3 g 100 mL/hr over 60 Minutes Intravenous Every 6 hours 12/23/14 1101            Objective:   Filed Vitals:   12/23/14 1632 12/24/14 0026 12/24/14 0500 12/24/14 0658  BP:  132/59  131/69  Pulse:  87    Temp:  97.5 F (36.4 C)  97.8 F (36.6 C)  TempSrc:  Oral  Oral  Resp:  20  20  Height:      Weight:   137.9 kg (304 lb 0.2 oz)   SpO2: 91% 100%  97%    Wt Readings from Last 3 Encounters:  12/24/14 137.9 kg (304 lb 0.2 oz)  12/06/14 134.265 kg (296 lb)  11/18/14 137.077 kg (302 lb 3.2 oz)     Intake/Output Summary (Last 24 hours) at 12/24/14 0835 Last data filed at 12/24/14 0000  Gross per 24 hour  Intake   1940 ml  Output    500 ml  Net   1440 ml     Physical  Exam  Awake Alert, Oriented X 3, No new F.N deficits, Normal affect [place].AT,PERRAL Supple Neck,No JVD, No cervical lymphadenopathy appriciated.  Symmetrical Chest wall movement, Good air movement bilaterally, diffuse bilateral crackles RRR,No Gallops,Rubs or new Murmurs, No Parasternal Heave +ve B.Sounds, Abd Soft, No tenderness, No organomegaly appriciated, No rebound - guarding or rigidity. No Cyanosis, Clubbing or edema, No new Rash or bruise     Data Review   Micro Results Recent Results (from the past 240 hour(s))  MRSA PCR Screening     Status: Abnormal   Collection Time: 12/14/14  9:30 AM  Result Value Ref Range Status   MRSA by PCR POSITIVE (A) NEGATIVE Final    Comment:        The GeneXpert MRSA Assay (FDA approved for NASAL specimens only), is one component of a comprehensive MRSA colonization surveillance program. It is not intended to diagnose MRSA infection nor to guide or monitor treatment for MRSA infections. RESULT CALLED TO, READ BACK BY AND VERIFIED WITH: MAIN,S @ 2542 ON [id number] BY POTEAT,S     Radiology Reports Dg Chest 2 View  12/13/2014   CLINICAL DATA:  Weakness and shortness of Breath  EXAM: CHEST  2 VIEW  COMPARISON:  12/11/2014  FINDINGS: Cardiac shadow is mildly enlarged. Patchy changes are noted in the bases bilaterally stable from the prior exam. No new focal infiltrate is seen. No sizable effusion is noted.  IMPRESSION: Patchy bibasilar changes.  No acute abnormality is noted.   Electronically Signed   By: [name] M.D.   On: 12/13/2014 11:07   Dg Chest 2 View  12/11/2014   CLINICAL DATA:  Short of breath.  History pneumonia.  EXAM: CHEST  2 VIEW  COMPARISON:  12/09/2014  FINDINGS: Chronic lung disease with hyperinflation and bibasilar scarring. Prominent lung markings in the bases are stable with prior studies  Cardiac enlargement with pulmonary vascular congestion. Findings are consistent with mild fluid overload without edema or effusion. No  definite pneumonia.  IMPRESSION: Chronic lung disease bibasilar scarring which appears stable  Cardiac enlargement with mild vascular congestion suggesting mild fluid overload.  No change from the prior study.   Electronically Signed   By: Franchot Gallo M.D.   On: 12/11/2014 10:36   Dg Chest 2 View (if Patient Has Fever And/or  Copd)  12/09/2014   CLINICAL DATA:  Acute onset of severe shortness of breath. Initial encounter.  EXAM: CHEST  2 VIEW  COMPARISON:  Chest radiograph performed 10/31/2014  FINDINGS: The lungs are well-aerated. Vascular congestion is noted. Bilateral central airspace opacities raise concern for pulmonary edema, superimposed on chronic lung changes as previously noted. Mild pneumonia might have a similar appearance. There is no evidence of pleural effusion or pneumothorax.  The heart is borderline normal in size. No acute osseous abnormalities are seen.  IMPRESSION: Vascular congestion noted. Bilateral central airspace opacities raise concern for mild pulmonary edema, superimposed on chronic lung changes. Mild pneumonia might have a similar appearance.   Electronically Signed   By: Garald Balding M.D.   On: 12/09/2014 04:22   Ct Angio Chest Pe W/cm &/or Wo Cm  12/18/2014   CLINICAL DATA:  Shortness breath, chest pain. History of sarcoidosis, COPD.  EXAM: CT ANGIOGRAPHY CHEST WITH CONTRAST  TECHNIQUE: Multidetector CT imaging of the chest was performed using the standard protocol during bolus administration of intravenous contrast. Multiplanar CT image reconstructions and MIPs were obtained to evaluate the vascular anatomy.  CONTRAST:  26mL OMNIPAQUE IOHEXOL 350 MG/ML SOLN  COMPARISON:  08/04/2013  FINDINGS: No filling defects in the pulmonary arteries to suggest pulmonary emboli. The distal vessels are not well visualized due to respiratory motion. Mild cardiomegaly. Aorta is normal caliber. Pulmonary artery remains dilated measuring 36 mm compared to 38 mm previously. Central pulmonary  arteries appear prominent. Findings suggest pulmonary arterial hypertension.  Mild emphysema/COPD. Diffuse ground-glass opacities throughout the lungs are stable since prior study. No pleural effusions. Chronic bibasilar densities likely reflect scarring. No pleural effusions.  Small scattered borderline sized mediastinal and bilateral hilar lymph nodes, similar to prior study. No axillary adenopathy.  Chest wall soft tissues are unremarkable. Imaging into the upper abdomen demonstrates diffuse fatty infiltration of the liver. No acute findings.  No acute bony abnormality or focal bone lesion.  Review of the MIP images confirms the above findings.  IMPRESSION: No evidence of pulmonary embolus. The distal peripheral vessels are not well visualized due to respiratory motion.  Borderline heart size. Stable dilatation of the pulmonary arteries compatible with pulmonary arterial hypertension.  Underline COPD. Stable diffuse ground-glass opacities throughout the lungs and bibasilar scarring.   Electronically Signed   By: Rolm Baptise M.D.   On: 12/18/2014 23:55   Ct Chest High Resolution  12/23/2014   CLINICAL DATA:  49 year old female with lung infiltrates. 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  EXAM: CT CHEST WITHOUT CONTRAST  TECHNIQUE: Multidetector CT imaging of the chest was performed following the standard protocol without intravenous contrast. High resolution imaging of the lungs, as well as inspiratory and expiratory imaging, was performed.  COMPARISON:  Chest CT 12/18/2014.  FINDINGS: Mediastinum/Lymph Nodes: Heart size is mildly enlarged. There is no significant pericardial fluid, thickening or pericardial calcification. There is atherosclerosis of the thoracic aorta, the great vessels of the mediastinum and the coronary arteries, including calcified atherosclerotic plaque in the left anterior descending coronary artery. Dilatation of the pulmonic trunk (4.4 cm in diameter), suggesting pulmonary arterial  hypertension. Multiple borderline enlarged and minimally enlarged mediastinal and bilateral hilar lymph nodes, measuring up to 1 cm in short axis in the right paratracheal nodal station. Esophagus is unremarkable in appearance. No axillary lymphadenopathy.  Lungs/Pleura: The appearance of the lungs is again very unusual, with areas of moderate to severe centrilobular and paraseptal emphysema scattered in a patchy distribution throughout the lungs, with areas  of significant involvement, and other areas of near complete sparing. There continues to be extensive thickening of the peribronchovascular interstitium, which is mild in some places, and very severe in other places. The most severely thickened areas of peribronchovascular interstitium remain in the lung bases, where there is some associated mild cylindrical bronchiectasis, these findings have been present on prior studies dating back to 04/27/2010, albeit more severe on the more recent prior examinations than the remote prior studies. There are a few new nodular appearing areas of airspace consolidation in the lungs bilaterally compared to the most recent prior study from 12/18/2014, presumably infectious or inflammatory. These are within the right lower lobe (images 22 and 26 of series 4) and the left lower lobe (images 32 and 38 of series 4). High-resolution images redemonstrate the above findings, and otherwise demonstrate no significant regions of peripheral subpleural reticulation or frank honeycombing. Inspiratory and expiratory imaging is unremarkable. No pleural effusions.  Upper Abdomen: Diffuse low-attenuation in the hepatic parenchyma, compatible with hepatic steatosis. Two tiny locules of gas are noted within the liver in segment 4 on images 56 and 57 of series 3, favored to be pneumobilia.  Musculoskeletal/Soft Tissues: Old healed fractures of the lateral aspects of the right seventh ninth and tenth ribs are noted. There are no aggressive appearing  lytic or blastic lesions noted in the visualized portions of the skeleton.  IMPRESSION: 1. 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 The pattern is somewhat unusual, and has slowly progressed when compared to remote prior studies dating back to 04/27/2010. Today's examination does demonstrate four new areas of nodular airspace consolidation in the lower lobes of the lungs bilaterally (detailed above), which have developed in the past 3 days, presumably infectious or inflammatory in etiology. 2. Dilatation of the pulmonic trunk, suggestive of pulmonary arterial hypertension. 3. Mild cardiomegaly. 4. 2 tiny locules of gas in segment 4 of the liver, presumably pneumobilia. However, the liver is incompletely visualized. Clinical correlation is recommended. If patient has had prior sphincterotomy, no further workup or evaluation may be warranted. In the absence of history of prior sphincterotomy or other surgical intervention of the biliary tract, clinical correlation for signs and symptoms of ascending cholangitis would be recommended. 5. Hepatic steatosis.   Electronically Signed   By: [name] M.D.   On: 12/23/2014 08:46   Nm Pulmonary Perf And Vent  12/17/2014   CLINICAL DATA:  49 year old female with shortness of breath and chest discomfort. Initial encounter.  EXAM: NUCLEAR MEDICINE VENTILATION - PERFUSION LUNG SCAN  TECHNIQUE: Ventilation images were obtained in multiple projections using inhaled aerosol technetium 99 M DTPA. Perfusion images were obtained in multiple projections after intravenous injection of Tc-51m MAA.  RADIOPHARMACEUTICALS:  44.0 mCi Tc-39m DTPA aerosol and 5 point for mCi Tc-60m MAA  COMPARISON:  Portable chest radiograph 12/16/2014.  FINDINGS: Ventilation: Is heterogeneous bilaterally, in part due to central clumping of the ventilation agent. Scattered bilateral peripheral ventilation  defects.  Perfusion: Scattered bilateral small and peripheral perfusion defects, but appear matched in both lungs.  IMPRESSION: Abnormal scan with matched bilateral pulmonary ventilation and perfusion defects that could reflect chronic obstructed pulmonary disease or chronic pulmonary embolus.  No scintigraphic evidence of acute PE.   Electronically Signed   By: Genevie Ann M.D.   On: 12/17/2014 13:05   Dg Chest Port 1 View  12/21/2014   CLINICAL DATA:  Respiratory failure  EXAM: PORTABLE CHEST -  1 VIEW  COMPARISON:  Chest x-ray and CT scan of the chest of December 18, 2014  FINDINGS: The lungs are well-expanded. The pulmonary interstitial markings remain increased but have improved slightly. There is subsegmental atelectasis at the right lung base that is stable. No significant pleural effusion is observed. The cardiac silhouette remains enlarged. The central pulmonary vascularity remains prominent but is less engorged today.  IMPRESSION: COPD. Mild improvement in interstitial edema secondary to CHF. There is persistent right basilar atelectasis or pneumonia.   Electronically Signed   By: David  Martinique   On: 12/21/2014 07:29   Dg Chest Portable 1 View  12/18/2014   CLINICAL DATA:  Shortness of Breath, acute  EXAM: PORTABLE CHEST - 1 VIEW  COMPARISON:  December 16, 2014  FINDINGS: There is interstitial edema superimposed on emphysematous change and bibasilar scarring. Heart is mildly enlarged with pulmonary venous hypertension. No adenopathy. No bone lesions.  IMPRESSION: Congestive heart failure superimposed on emphysematous change. No airspace consolidation.   Electronically Signed   By: Lowella Grip III M.D.   On: 12/18/2014 19:20   Dg Chest Port 1 View  12/16/2014   CLINICAL DATA:  Shortness of breath, respiratory failure, CHF and pulmonary infiltrates.  EXAM: PORTABLE CHEST - 1 VIEW  COMPARISON:  12/14/2014  FINDINGS: Bibasilar opacities, right greater than left are favored to largely represent chronic  areas of scarring and atelectasis when reviewing prior chest x-rays. No progressive pulmonary airspace consolidation is identified. There is no evidence of overt pulmonary edema or pleural fluid. No pneumothorax is identified. The heart size is stable and normal.  IMPRESSION: Remaining bibasilar opacities, right greater than left, are favored to represent chronic areas of scarring and atelectasis.   Electronically Signed   By: Aletta Edouard M.D.   On: 12/16/2014 11:38   Dg Chest Port 1 View  12/14/2014   CLINICAL DATA:  Shortness of breath and chest pain  EXAM: PORTABLE CHEST - 1 VIEW  COMPARISON:  12/13/2014  FINDINGS: Cardiac shadow remains enlarged. Patchy infiltrate is noted in the right lung base. Some diffuse interstitial changes are noted bilaterally which may be related to superimposed congestive failure. This is more prominent than that seen on the prior exam.  IMPRESSION: Stable patchy right basilar infiltrate.  Increasing vascular congestion and interstitial edema.   Electronically Signed   By: [name] M.D.   On: 12/14/2014 10:57     CBC  Recent Labs Lab 12/18/14 1846 12/20/14 0325 12/21/14 0423 12/24/14 0540  WBC 12.2* 15.4* 8.1 12.4*  HGB 13.8 13.4 13.3 12.3  HCT 41.8 42.0 41.9 38.9  PLT 88* 133* 127* 145*  MCV 80.4 82.5 83.1 81.6  MCH 26.5 26.3 26.4 25.8*  MCHC 33.0 31.9 31.7 31.6  RDW 19.6* 19.7* 19.9* 19.0*    Chemistries   Recent Labs Lab 12/18/14 1846 12/19/14 0700 12/20/14 0325 12/21/14 0423 12/22/14 0526 12/23/14 0502 12/24/14 0540  NA 130* 132* 136 138 129* 131* 132*  K 4.9 5.8* 3.3* 3.6 4.9 4.3 4.7  CL 97 99 96 94* 91* 94* 96  CO2 24 24 30  35* 28 27 28   GLUCOSE 244* 373* 180* 161* 310* 226* 276*  BUN 14 18 16 17  28* 26* 21  CREATININE 0.96 1.07 0.91 1.16* 1.79* 1.27* 0.88  CALCIUM 9.0 8.7 7.8* 8.0* 9.0 9.0 9.2  AST 56* 30  --   --   --   --   --   ALT 63* 57*  --   --   --   --   --  ALKPHOS 106 96  --   --   --   --   --   BILITOT 2.0* 1.2   --   --   --   --   --    ------------------------------------------------------------------------------------------------------------------ estimated creatinine clearance is 116.7 mL/min (by C-G formula based on Cr of 0.88). ------------------------------------------------------------------------------------------------------------------ No results for input(s): HGBA1C in the last 72 hours. ------------------------------------------------------------------------------------------------------------------ No results for input(s): CHOL, HDL, LDLCALC, TRIG, CHOLHDL, LDLDIRECT in the last 72 hours. ------------------------------------------------------------------------------------------------------------------ No results for input(s): TSH, T4TOTAL, T3FREE, THYROIDAB in the last 72 hours.  Invalid input(s): FREET3 ------------------------------------------------------------------------------------------------------------------ No results for input(s): VITAMINB12, FOLATE, FERRITIN, TIBC, IRON, RETICCTPCT in the last 72 hours.  Coagulation profile  Recent Labs Lab 12/19/14 0115  INR 1.03    No results for input(s): DDIMER in the last 72 hours.  Cardiac Enzymes  Recent Labs Lab 12/19/14 0115 12/19/14 0700 12/19/14 1215  TROPONINI 0.05* <0.03 0.06*   ------------------------------------------------------------------------------------------------------------------ Invalid input(s): POCBNP     Time Spent in minutes   35   Kenzi Bardwell K M.D on 12/24/2014 at 8:35 AM  Between 7am to 7pm - Pager - [phone]  After 7pm go to [email or web address] - password [facility]  [facility]   Office  [phone]

## 2014-12-24 NOTE — Progress Notes (Signed)
Occupational Therapy Treatment Patient Details Name: [name] MRN: [id number] DOB: [birth year] Today's Date: 12/24/2014    History of present illness Patient is a 49 yo female admitted 12/18/14 with acute on chronic resp failure, CHF.  Patient recently admitted 3/16 - 3/25 with pna, COPD, CHF (just d/c on day of admit).  PMH:  asthma, obesity, COPD, CHF, DM, depression, anxiety, HTN, fibromyalgia, polysubstance abuse, OSA on CPAP   OT comments  Pt making good progress. Ambulating without AD @ 60 ft on 6L during ADL activity with O2 Sats dropping to 88. Rebound to 94 after 1 min pursed lip breathing. Discussed E conservation techniques. Given AE to assist with ADL. Will plan to see an additional visit to complete education on theraband HEP and E conservation. Pt appreciative of help.   Follow Up Recommendations  No OT follow up;Supervision - Intermittent    Equipment Recommendations  None recommended by OT    Recommendations for Other Services      Precautions / Restrictions Precautions Precautions: Other (comment) (O2 Sats) Restrictions Weight Bearing Restrictions: No       Mobility Bed Mobility Overal bed mobility: Modified Independent                Transfers Overall transfer level: Modified independent                    Balance Overall balance assessment: No apparent balance deficits (not formally assessed)                                 ADL       Grooming: Set up       Lower Body Bathing: Set up       Lower Body Dressing: Set up       [place] and Hygiene: Modified independent       Functional mobility during ADLs: Modified independent General ADL Comments: Ambulated @ room @ 60 ft during simulated ADL session on 6L Os. O2 Sat drop to 88. rebound to 94 with pursed lip breathing after @ 1 min. Educated on E conservation techniques. Educated on use of long handled sponge and reacher for ADL. Also  educated on use of reacher for IADL tasks for E conservation.      Vision                     Perception     Praxis      Cognition   Behavior During Therapy: WFL for tasks assessed/performed Overall Cognitive Status: Within Functional Limits for tasks assessed                       Extremity/Trunk Assessment               Exercises Other Exercises Other Exercises: encouraged BUE aROM exercises throughout the day within her tolerance to build endurance. Pt verbalized understanding. States " Ijust nee to do it"   Shoulder Instructions       General Comments      Pertinent Vitals/ Pain       Pain Assessment: No/denies pain  Home Living                                          Prior Functioning/Environment  Frequency       Progress Toward Goals  OT Goals(current goals can now be found in the care plan section)  Progress towards OT goals: Progressing toward goals (adequate for D?C)  Acute Rehab OT Goals Patient Stated Goal: to go home  OT Goal Formulation: With patient Time For Goal Achievement: 01/04/15 Potential to Achieve Goals: Good ADL Goals Pt Will Perform Grooming: with modified independence;standing Pt Will Transfer to Toilet: with modified independence;ambulating;regular height toilet;bedside commode;grab bars Pt Will Perform Toileting - Clothing Manipulation and hygiene: with modified independence;sit to/from stand Pt/caregiver will Perform Home Exercise Program: Increased strength;Right Upper extremity;Left upper extremity;With written HEP provided;With theraband;Independently Additional ADL Goal #1: Pt will be independent with energy conservation techniques   Plan Discharge plan needs to be updated    Co-evaluation                 End of Session Equipment Utilized During Treatment: Oxygen (6L)   Activity Tolerance Patient tolerated treatment well   Patient Left in bed;with call  bell/phone within reach (sitting EOB)   Nurse Communication Mobility status        Time: 1607-3710 OT Time Calculation (min): 26 min  Charges: OT General Charges $OT Visit: 1 Procedure OT Treatments $Self Care/Home Management : 23-37 mins  Tc Kapusta,HILLARY 12/24/2014, 11:27 AM   Maurie Boettcher, OTR/L  [phone] 12/24/2014

## 2014-12-24 NOTE — Progress Notes (Signed)
ANTIBIOTIC CONSULT NOTE - FOLLOW UP  Pharmacy Consult for Unasyn Indication: aspiration PNA  Allergies  Allergen Reactions  . Simvastatin Other (See Comments)    Severe leg pain and burning  . Sulfamethoxazole-Trimethoprim Nausea And Vomiting    Causes Projectile Vomiting  . Zolpidem Tartrate Other (See Comments)    Chest pain  . Azithromycin Other (See Comments)    Resistant to med  . Ceftin [Cefuroxime] Swelling and Other (See Comments)    MOUTH SWELLING  . Doxycycline Other (See Comments)    Resistant to med  . Latex Itching and Rash    Also burning sensations  . Metformin And Related Diarrhea    Severe diarrhea  . Metolazone Other (See Comments)    MYALGIAS  . Metronidazole Nausea And Vomiting  . Ciprofloxacin Nausea Only  . Tramadol Itching and Nausea Only    Patient Measurements: Height: 5\' 8"  (172.7 cm) Weight: (!) 304 lb 0.2 oz (137.9 kg) IBW/kg (Calculated) : 63.9 Adjusted Body Weight:    Vital Signs: Temp: 97.8 F (36.6 C) (03/31 0658) Temp Source: Oral (03/31 0658) BP: 131/69 mmHg (03/31 0658) Pulse Rate: 87 (03/31 0026) Intake/Output from previous day: 03/30 0701 - 03/31 0700 In: 1940 [P.O.:840; IV Piggyback:100] Out: 500 [Urine:500] Intake/Output from this shift:    Labs:  Recent Labs  12/21/14 1100 12/22/14 0526 12/23/14 0502 12/24/14 0540  WBC  --   --   --  12.4*  HGB  --   --   --  12.3  PLT  --   --   --  145*  LABCREA 125.18  --   --   --   CREATININE  --  1.79* 1.27* 0.88   Estimated Creatinine Clearance: 116.7 mL/min (by C-G formula based on Cr of 0.88). No results for input(s): VANCOTROUGH, VANCOPEAK, VANCORANDOM, GENTTROUGH, GENTPEAK, GENTRANDOM, TOBRATROUGH, TOBRAPEAK, TOBRARND, AMIKACINPEAK, AMIKACINTROU, AMIKACIN in the last 72 hours.   Microbiology:   Anti-infectives    Start     Dose/Rate Route Frequency Ordered Stop   12/23/14 1200  Ampicillin-Sulbactam (UNASYN) 3 g in sodium chloride 0.9 % 100 mL IVPB     3 g 100  mL/hr over 60 Minutes Intravenous Every 6 hours 12/23/14 1101        Assessment: worsening sob  AC: SCDs  ID: Unasyn for asp PNA - vomited at CT after injecting home narc/BZD overnight 3/29. Afebrile. WBC 12.4 elevated on solumedrol. Scr down to 0.88. No cultures. Unasyn 3/30>>   Plan: - Unasyn 3 g IV q6h. No further dosing adjustments needed. -Pharmacy will sign off. Please reconsult for further dosing assitance.   [name] S. Alford Highland, PharmD, BCPS Clinical Staff Pharmacist Pager [phone]  [name] 12/24/2014,8:41 AM

## 2014-12-24 NOTE — Progress Notes (Signed)
Pt placed on cpap machine with 02 bleed in at 5lpm, tolerating well at this time.

## 2014-12-25 ENCOUNTER — Inpatient Hospital Stay (HOSPITAL_COMMUNITY): Payer: Medicare Other

## 2014-12-25 DIAGNOSIS — K838 Other specified diseases of biliary tract: Secondary | ICD-10-CM

## 2014-12-25 DIAGNOSIS — R7989 Other specified abnormal findings of blood chemistry: Secondary | ICD-10-CM

## 2014-12-25 DIAGNOSIS — I5033 Acute on chronic diastolic (congestive) heart failure: Secondary | ICD-10-CM

## 2014-12-25 LAB — BASIC METABOLIC PANEL
ANION GAP: 3 — AB (ref 5–15)
BUN: 19 mg/dL (ref 6–23)
CALCIUM: 9.1 mg/dL (ref 8.4–10.5)
CO2: 37 mmol/L — ABNORMAL HIGH (ref 19–32)
Chloride: 96 mmol/L (ref 96–112)
Creatinine, Ser: 0.96 mg/dL (ref 0.50–1.10)
GFR calc Af Amer: 80 mL/min — ABNORMAL LOW (ref >90)
GFR, EST NON AFRICAN AMERICAN: 69 mL/min — AB (ref >90)
Glucose, Bld: 226 mg/dL — ABNORMAL HIGH (ref 70–99)
Potassium: 3.4 mmol/L — ABNORMAL LOW (ref 3.5–5.1)
Sodium: 136 mmol/L (ref 135–145)

## 2014-12-25 LAB — GLUCOSE, CAPILLARY
Glucose-Capillary: 201 mg/dL — ABNORMAL HIGH (ref 70–99)
Glucose-Capillary: 200 mg/dL — ABNORMAL HIGH (ref 70–99)
Glucose-Capillary: 307 mg/dL — ABNORMAL HIGH (ref 70–99)
Glucose-Capillary: 250 mg/dL — ABNORMAL HIGH (ref 70–99)

## 2014-12-25 LAB — CBC
HEMATOCRIT: 38 % (ref 36.0–46.0)
Hemoglobin: 11.8 g/dL — ABNORMAL LOW (ref 12.0–15.0)
MCH: 25.6 pg — AB (ref 26.0–34.0)
MCHC: 31.1 g/dL (ref 30.0–36.0)
MCV: 82.4 fL (ref 78.0–100.0)
Platelets: 163 10*3/uL (ref 150–400)
RBC: 4.61 MIL/uL (ref 3.87–5.11)
RDW: 19.3 % — ABNORMAL HIGH (ref 11.5–15.5)
WBC: 9.8 10*3/uL (ref 4.0–10.5)

## 2014-12-25 LAB — HEPATIC FUNCTION PANEL
ALK PHOS: 152 U/L — AB (ref 39–117)
ALT: 357 U/L — ABNORMAL HIGH (ref 0–35)
AST: 90 U/L — ABNORMAL HIGH (ref 0–37)
Albumin: 2.8 g/dL — ABNORMAL LOW (ref 3.5–5.2)
BILIRUBIN TOTAL: 1.1 mg/dL (ref 0.3–1.2)
Bilirubin, Direct: 0.2 mg/dL (ref 0.0–0.5)
Indirect Bilirubin: 0.9 mg/dL (ref 0.3–0.9)
Total Protein: 6.1 g/dL (ref 6.0–8.3)

## 2014-12-25 MED ORDER — FUROSEMIDE 40 MG PO TABS
40.0000 mg | ORAL_TABLET | Freq: Every day | ORAL | Status: DC
  Administered 2014-12-25 – 2014-12-27 (×3): 40 mg via ORAL
  Filled 2014-12-25 (×3): qty 1

## 2014-12-25 MED ORDER — LORAZEPAM 2 MG/ML IJ SOLN
1.0000 mg | Freq: Once | INTRAMUSCULAR | Status: DC
  Filled 2014-12-25: qty 1

## 2014-12-25 MED ORDER — DEXTROSE 50 % IV SOLN
INTRAVENOUS | Status: AC
  Filled 2014-12-25: qty 50

## 2014-12-25 NOTE — Progress Notes (Signed)
CARE MANAGEMENT NOTE 12/25/2014  Patient:  [name], [name]   Account Number:  [id number]  Date Initiated:  12/25/2014  Documentation initiated by:  [facility]  Subjective/Objective Assessment:   admitted with acute respiratory failure  lives alone, has aides 3hrs/day 7d/wk  has home O2 with [employer]     Action/Plan:   PT/OT evals- no follow up therapy recommended   Anticipated DC Date:  12/27/2014   Anticipated DC Plan:  [place]  CM consult      Choice offered to / List presented to:             Status of service:  In process, will continue to follow Medicare Important Message given?  YES (If response is "NO", the following Medicare IM given date fields will be blank) Date Medicare IM given:  12/25/2014 Medicare IM given by:  [facility] Date Additional Medicare IM given:   Additional Medicare IM given by:    Discharge Disposition:    Per UR Regulation:  Reviewed for med. necessity/level of care/duration of stay  If discussed at [place] of Stay Meetings, dates discussed:    Comments:  12/25/14 Spoke with patient about discharge plans.Patient lives alone but has an aide 3hrs/day 7days per week and family in [place] to assist prn. [name] stated that she has a rolling walker at home but does not use it often and has home O2 thru [place]. PT and OT did not recommended home therapy and no equipment needs were identified. Will contine to follow for d/c needs.

## 2014-12-25 NOTE — Progress Notes (Signed)
PT Cancellation Note  Patient Details Name: [name] MRN: [id number] DOB: [birth year]   Cancelled Treatment:    Reason Eval/Treat Not Completed: Patient at procedure or test/unavailable. Patient refused ambulation at this time as she stated that radiology is on their way to take her for ultrasound and then she was scheduled for MRI. Patient also stated that she was "antsy" today and ready to eat and have all this over with today.    [name] 12/25/2014, 3:05 PM

## 2014-12-25 NOTE — Progress Notes (Signed)
Patient Demographics  [name], is a 49 y.o. female, DOB - 27-Oct-1965, WCB:[id number]  Admit date - 12/18/2014   Admitting Physician Ivor Costa, MD  Outpatient Primary MD for the patient is Yvette Riddle, MD  LOS - 7   Chief Complaint  Patient presents with  . Shortness of Breath      Summary   49 y.o. female with past medical history of asthma, COPD, diastolic congestive heart failure (EF 55-60% with grade 1 diastolic dysfunction), diabetes mellitus, GERD, depression, anxiety disorder, who presents with worsening shortness of breath.  Patient was recently hospitalized from 3/16-3/25, and was treated for community acquired pneumonia, congestive heart failure exacerbation and COPD exacerbation. She completed 5 days of Levaquin treatment. Her flu PCR test was negative. She was just discharged home on 12/18/2014, she was admitted the same day with worsening respiratory failure.  [name] was consulted this admission and the initial opinion was patient was in fluid overload, she was diuresed, she developed acute renal failure and dehydration but her shortness of breath did not improve. 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 She was then placed on steroids with gradual improvement now. Still has significant oxygen demand and has room to improve. At baseline she is between 3-4 L nasal cannula oxygen currently requiring up to 8 L on ambulation.   Subjective:   [name] today has, No headache, No chest pain, No abdominal pain - No Nausea, No new weakness tingling or numbness, No Cough - Improved SOB.    Assessment & Plan    1. Acute on chronic respiratory failure. On home oxygen, history of advanced COPD, obesity hypoventilation syndrome, obstructive sleep apnea along with chronic  diastolic CHF with EF 61%. 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  She is on 4 L nasal cannula oxygen but is worse and requiring 6-8 L, despite aggressive diuresis shortness of breath has not resolved, pulmonary has been following, now on a trial of steroids. 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 Echogram otherwise showing only mild grade 1 diastolic dysfunction. Further management per pulmonary. She has shown some clinical improvement on steroids which will be continued.   Filed Weights   12/23/14 0641 12/24/14 0500 12/25/14 0523  Weight: 136.5 kg (300 lb 14.9 oz) 137.9 kg (304 lb 0.2 oz) 144.4 kg (318 lb 5.5 oz)      2.DM2 -  as she is on steroid will increase Lantus to 45 units twice a day on 12/24/2014, have already added sliding scale along with pre-meal NovoLog. Will monitor CBGs.  Lab Results  Component Value Date   HGBA1C 9.4* 12/19/2014    CBG (last 3)   Recent Labs  12/24/14 1702 12/24/14 2125 12/25/14 0805  GLUCAP 234* 288* 201*     3. GERD. Continue PPI.   4. Hyperkalemia. Has resolved after Kayexalate and diuresis.   5. History of smoking. Counseled to quit.   6. Thrombocytopenia. HRT was checked recently negative, no evidence of bleeding. We will monitor.   7. Alcohol abuse. CIWA protocol.   8. Anxiety. As needed Xanax.   9. Hyponatremia with ARF. Due to overdiuresis, she is dehydrated,  resolved after IV fluids, will resume diuretics on 12/25/2014.    10. Aspiration HCAP. Due to abusing narcotics in the hospital, she was caught injecting crushed narcotic pills into her IV site on 12/22/2014, she subsequently vomited, on CT scan likely aspiration. We will place her on Unasyn and monitor. Has been counseled not to abuse narcotics.   11. Incidental finding of a double pneumonia on CT chest. Case discussed with Dr. Tera Helper GI,  since patient is asymptomatic we will continue to monitor, however her liver enzymes have trended upwards. We will request GI to monitor one time. Will order right upper quadrant ultrasound as well.     Code Status: Full  Family Communication: None  Disposition Plan:  Home   Procedures   TTE  12-21-14  - Left ventricle: The cavity size was normal. Wall thickness was increased in a pattern of moderate LVH. Systolic function was vigorous. The estimated ejection fraction was in the range of 65%to 70%. Doppler parameters are consistent with abnormal left ventricular relaxation (grade 1 diastolic dysfunction). Doppler parameters are consistent with high ventricular filling pressure. - Ventricular septum: The contour showed diastolic flattening and systolic flattening. - Mitral valve: There was mild regurgitation. - Right ventricle: The cavity size was moderately dilated. Systolic function was severely reduced. - Right atrium: The atrium was mildly dilated. - Atrial septum: The septum bowed from right to left, consistent with increased right atrial pressure. - Pulmonary arteries: Systolic pressure was severely increased. PA peak pressure: 106 mm Hg (S).  Impressions:  Normal LV function; grade 1 diastolic dysfunction; RAE/RVE; severely reduced RV function; severely elevated pulmonary pressure.    CTA Chest - no PE, diffuse groundglass pattern   TTE April 2015  - Left ventricle: The cavity size was normal. There was moderate concentric hypertrophy. Systolic function wasnormal. The estimated ejection fraction was in the range of 55% to 60%. Although no diagnostic regional wall motionabnormality was identified, this possibility cannot becompletely excluded on the basis of this study. Doppler parameters are consistent with abnormal left ventricularrelaxation (grade 1 diastolic dysfunction). - Pulmonary arteries: PA peak pressure: 40mm Hg (S).   Consults  PCCM,  GI   Medications  Scheduled Meds: . ALPRAZolam  0.5 mg Oral TID  . ampicillin-sulbactam (UNASYN) IV  3 g Intravenous Q6H  . antiseptic oral rinse  7 mL Mouth Rinse q12n4p  . aspirin EC  81 mg Oral Daily  . dextromethorphan-guaiFENesin  1 tablet Oral BID  . diclofenac sodium  4 g Topical QID  . fluticasone  2 spray Each Nare Daily  . folic acid  1 mg Oral Daily  . gabapentin  300 mg Oral QHS  . insulin aspart  0-15 Units Subcutaneous TID WC  . insulin aspart  0-5 Units Subcutaneous QHS  . insulin aspart  5 Units Subcutaneous TID WC  . insulin glargine  45 Units Subcutaneous BID  . ipratropium-albuterol  3 mL Nebulization QID  . loratadine  10 mg Oral Daily  . multivitamin with minerals  1 tablet Oral Daily  . omega-3 acid ethyl esters  1 capsule Oral Daily  . pantoprazole  40 mg Oral Daily  . predniSONE  30 mg Oral Q breakfast  . sodium chloride  3 mL Intravenous Q12H  . thiamine  100 mg Oral Daily  . varenicline  0.5 mg Oral BID WC  . venlafaxine XR  37.5 mg Oral Q breakfast   Continuous Infusions:   PRN Meds:.acetaminophen, guaiFENesin, hydrOXYzine, levalbuterol, [DISCONTINUED] ondansetron **OR**  ondansetron (ZOFRAN) IV, oxyCODONE, tiZANidine  DVT Prophylaxis  SCD due to thrombocytopenia  Lab Results  Component Value Date   PLT 163 12/25/2014    Antibiotics     Anti-infectives    Start     Dose/Rate Route Frequency Ordered Stop   12/23/14 1200  Ampicillin-Sulbactam (UNASYN) 3 g in sodium chloride 0.9 % 100 mL IVPB     3 g 100 mL/hr over 60 Minutes Intravenous Every 6 hours 12/23/14 1101            Objective:   Filed Vitals:   12/24/14 2125 12/25/14 0523 12/25/14 0740 12/25/14 1031  BP: 112/62 126/68    Pulse: 81 84    Temp: 98.1 F (36.7 C) 97.6 F (36.4 C)    TempSrc: Oral Axillary    Resp: 20 18    Height:      Weight:  144.4 kg (318 lb 5.5 oz)    SpO2: 97% 96% 97% 96%    Wt Readings from Last 3 Encounters:  12/25/14 144.4 kg (318 lb 5.5 oz)   12/06/14 134.265 kg (296 lb)  11/18/14 137.077 kg (302 lb 3.2 oz)     Intake/Output Summary (Last 24 hours) at 12/25/14 1103 Last data filed at 12/25/14 1000  Gross per 24 hour  Intake    962 ml  Output    550 ml  Net    412 ml     Physical Exam  Awake Alert, Oriented X 3, No new F.N deficits, Normal affect [place].AT,PERRAL Supple Neck,No JVD, No cervical lymphadenopathy appriciated.  Symmetrical Chest wall movement, Good air movement bilaterally, diffuse bilateral crackles RRR,No Gallops,Rubs or new Murmurs, No Parasternal Heave +ve B.Sounds, Abd Soft, No tenderness, No organomegaly appriciated, No rebound - guarding or rigidity. No Cyanosis, Clubbing or edema, No new Rash or bruise     Data Review   Micro Results No results found for this or any previous visit (from the past 240 hour(s)).  Radiology Reports Dg Chest 2 View  12/13/2014   CLINICAL DATA:  Weakness and shortness of Breath  EXAM: CHEST  2 VIEW  COMPARISON:  12/11/2014  FINDINGS: Cardiac shadow is mildly enlarged. Patchy changes are noted in the bases bilaterally stable from the prior exam. No new focal infiltrate is seen. No sizable effusion is noted.  IMPRESSION: Patchy bibasilar changes.  No acute abnormality is noted.   Electronically Signed   By: [name] M.D.   On: 12/13/2014 11:07   Dg Chest 2 View  12/11/2014   CLINICAL DATA:  Short of breath.  History pneumonia.  EXAM: CHEST  2 VIEW  COMPARISON:  12/09/2014  FINDINGS: Chronic lung disease with hyperinflation and bibasilar scarring. Prominent lung markings in the bases are stable with prior studies  Cardiac enlargement with pulmonary vascular congestion. Findings are consistent with mild fluid overload without edema or effusion. No definite pneumonia.  IMPRESSION: Chronic lung disease bibasilar scarring which appears stable  Cardiac enlargement with mild vascular congestion suggesting mild fluid overload.  No change from the prior study.   Electronically  Signed   By: Franchot Gallo M.D.   On: 12/11/2014 10:36   Dg Chest 2 View (if Patient Has Fever And/or Copd)  12/09/2014   CLINICAL DATA:  Acute onset of severe shortness of breath. Initial encounter.  EXAM: CHEST  2 VIEW  COMPARISON:  Chest radiograph performed 10/31/2014  FINDINGS: The lungs are well-aerated. Vascular congestion is noted. Bilateral central airspace opacities raise concern for pulmonary edema,  superimposed on chronic lung changes as previously noted. Mild pneumonia might have a similar appearance. There is no evidence of pleural effusion or pneumothorax.  The heart is borderline normal in size. No acute osseous abnormalities are seen.  IMPRESSION: Vascular congestion noted. Bilateral central airspace opacities raise concern for mild pulmonary edema, superimposed on chronic lung changes. Mild pneumonia might have a similar appearance.   Electronically Signed   By: Garald Balding M.D.   On: 12/09/2014 04:22   Ct Angio Chest Pe W/cm &/or Wo Cm  12/18/2014   CLINICAL DATA:  Shortness breath, chest pain. History of sarcoidosis, COPD.  EXAM: CT ANGIOGRAPHY CHEST WITH CONTRAST  TECHNIQUE: Multidetector CT imaging of the chest was performed using the standard protocol during bolus administration of intravenous contrast. Multiplanar CT image reconstructions and MIPs were obtained to evaluate the vascular anatomy.  CONTRAST:  29mL OMNIPAQUE IOHEXOL 350 MG/ML SOLN  COMPARISON:  08/04/2013  FINDINGS: No filling defects in the pulmonary arteries to suggest pulmonary emboli. The distal vessels are not well visualized due to respiratory motion. Mild cardiomegaly. Aorta is normal caliber. Pulmonary artery remains dilated measuring 36 mm compared to 38 mm previously. Central pulmonary arteries appear prominent. Findings suggest pulmonary arterial hypertension.  Mild emphysema/COPD. Diffuse ground-glass opacities throughout the lungs are stable since prior study. No pleural effusions. Chronic bibasilar  densities likely reflect scarring. No pleural effusions.  Small scattered borderline sized mediastinal and bilateral hilar lymph nodes, similar to prior study. No axillary adenopathy.  Chest wall soft tissues are unremarkable. Imaging into the upper abdomen demonstrates diffuse fatty infiltration of the liver. No acute findings.  No acute bony abnormality or focal bone lesion.  Review of the MIP images confirms the above findings.  IMPRESSION: No evidence of pulmonary embolus. The distal peripheral vessels are not well visualized due to respiratory motion.  Borderline heart size. Stable dilatation of the pulmonary arteries compatible with pulmonary arterial hypertension.  Underline COPD. Stable diffuse ground-glass opacities throughout the lungs and bibasilar scarring.   Electronically Signed   By: Rolm Baptise M.D.   On: 12/18/2014 23:55   Ct Chest High Resolution  12/23/2014   CLINICAL DATA:  49 year old female with lung infiltrates. 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  EXAM: CT CHEST WITHOUT CONTRAST  TECHNIQUE: Multidetector CT imaging of the chest was performed following the standard protocol without intravenous contrast. High resolution imaging of the lungs, as well as inspiratory and expiratory imaging, was performed.  COMPARISON:  Chest CT 12/18/2014.  FINDINGS: Mediastinum/Lymph Nodes: Heart size is mildly enlarged. There is no significant pericardial fluid, thickening or pericardial calcification. There is atherosclerosis of the thoracic aorta, the great vessels of the mediastinum and the coronary arteries, including calcified atherosclerotic plaque in the left anterior descending coronary artery. Dilatation of the pulmonic trunk (4.4 cm in diameter), suggesting pulmonary arterial hypertension. Multiple borderline enlarged and minimally enlarged mediastinal and bilateral hilar lymph nodes, measuring up to 1 cm in short axis in the right paratracheal nodal station. Esophagus is unremarkable in appearance. No  axillary lymphadenopathy.  Lungs/Pleura: The appearance of the lungs is again very unusual, with areas of moderate to severe centrilobular and paraseptal emphysema scattered in a patchy distribution throughout the lungs, with areas of significant involvement, and other areas of near complete sparing. There continues to be extensive thickening of the peribronchovascular interstitium, which is mild in some places, and very severe in other places. The most severely thickened areas of peribronchovascular interstitium remain in the lung bases, where there is some  associated mild cylindrical bronchiectasis, these findings have been present on prior studies dating back to 04/27/2010, albeit more severe on the more recent prior examinations than the remote prior studies. There are a few new nodular appearing areas of airspace consolidation in the lungs bilaterally compared to the most recent prior study from 12/18/2014, presumably infectious or inflammatory. These are within the right lower lobe (images 22 and 26 of series 4) and the left lower lobe (images 32 and 38 of series 4). High-resolution images redemonstrate the above findings, and otherwise demonstrate no significant regions of peripheral subpleural reticulation or frank honeycombing. Inspiratory and expiratory imaging is unremarkable. No pleural effusions.  Upper Abdomen: Diffuse low-attenuation in the hepatic parenchyma, compatible with hepatic steatosis. Two tiny locules of gas are noted within the liver in segment 4 on images 56 and 57 of series 3, favored to be pneumobilia.  Musculoskeletal/Soft Tissues: Old healed fractures of the lateral aspects of the right seventh ninth and tenth ribs are noted. There are no aggressive appearing lytic or blastic lesions noted in the visualized portions of the skeleton.  IMPRESSION: 1. 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 The  pattern is somewhat unusual, and has slowly progressed when compared to remote prior studies dating back to 04/27/2010. Today's examination does demonstrate four new areas of nodular airspace consolidation in the lower lobes of the lungs bilaterally (detailed above), which have developed in the past 3 days, presumably infectious or inflammatory in etiology. 2. Dilatation of the pulmonic trunk, suggestive of pulmonary arterial hypertension. 3. Mild cardiomegaly. 4. 2 tiny locules of gas in segment 4 of the liver, presumably pneumobilia. However, the liver is incompletely visualized. Clinical correlation is recommended. If patient has had prior sphincterotomy, no further workup or evaluation may be warranted. In the absence of history of prior sphincterotomy or other surgical intervention of the biliary tract, clinical correlation for signs and symptoms of ascending cholangitis would be recommended. 5. Hepatic steatosis.   Electronically Signed   By: [name] M.D.   On: 12/23/2014 08:46   Nm Pulmonary Perf And Vent  12/17/2014   CLINICAL DATA:  49 year old female with shortness of breath and chest discomfort. Initial encounter.  EXAM: NUCLEAR MEDICINE VENTILATION - PERFUSION LUNG SCAN  TECHNIQUE: Ventilation images were obtained in multiple projections using inhaled aerosol technetium 99 M DTPA. Perfusion images were obtained in multiple projections after intravenous injection of Tc-23m MAA.  RADIOPHARMACEUTICALS:  44.0 mCi Tc-70m DTPA aerosol and 5 point for mCi Tc-44m MAA  COMPARISON:  Portable chest radiograph 12/16/2014.  FINDINGS: Ventilation: Is heterogeneous bilaterally, in part due to central clumping of the ventilation agent. Scattered bilateral peripheral ventilation defects.  Perfusion: Scattered bilateral small and peripheral perfusion defects, but appear matched in both lungs.  IMPRESSION: Abnormal scan with matched bilateral pulmonary ventilation and perfusion defects that could reflect  chronic obstructed pulmonary disease or chronic pulmonary embolus.  No scintigraphic evidence of acute PE.   Electronically Signed   By: Genevie Ann M.D.   On: 12/17/2014 13:05   Dg Chest Port 1 View  12/21/2014   CLINICAL DATA:  Respiratory failure  EXAM: PORTABLE CHEST - 1 VIEW  COMPARISON:  Chest x-ray and CT scan of the chest of December 18, 2014  FINDINGS: The lungs are well-expanded. The pulmonary interstitial markings remain increased but have improved slightly. There is subsegmental atelectasis at the right lung base that is stable. No significant pleural effusion  is observed. The cardiac silhouette remains enlarged. The central pulmonary vascularity remains prominent but is less engorged today.  IMPRESSION: COPD. Mild improvement in interstitial edema secondary to CHF. There is persistent right basilar atelectasis or pneumonia.   Electronically Signed   By: David  Martinique   On: 12/21/2014 07:29   Dg Chest Portable 1 View  12/18/2014   CLINICAL DATA:  Shortness of Breath, acute  EXAM: PORTABLE CHEST - 1 VIEW  COMPARISON:  December 16, 2014  FINDINGS: There is interstitial edema superimposed on emphysematous change and bibasilar scarring. Heart is mildly enlarged with pulmonary venous hypertension. No adenopathy. No bone lesions.  IMPRESSION: Congestive heart failure superimposed on emphysematous change. No airspace consolidation.   Electronically Signed   By: Lowella Grip III M.D.   On: 12/18/2014 19:20   Dg Chest Port 1 View  12/16/2014   CLINICAL DATA:  Shortness of breath, respiratory failure, CHF and pulmonary infiltrates.  EXAM: PORTABLE CHEST - 1 VIEW  COMPARISON:  12/14/2014  FINDINGS: Bibasilar opacities, right greater than left are favored to largely represent chronic areas of scarring and atelectasis when reviewing prior chest x-rays. No progressive pulmonary airspace consolidation is identified. There is no evidence of overt pulmonary edema or pleural fluid. No pneumothorax is identified. The  heart size is stable and normal.  IMPRESSION: Remaining bibasilar opacities, right greater than left, are favored to represent chronic areas of scarring and atelectasis.   Electronically Signed   By: Aletta Edouard M.D.   On: 12/16/2014 11:38   Dg Chest Port 1 View  12/14/2014   CLINICAL DATA:  Shortness of breath and chest pain  EXAM: PORTABLE CHEST - 1 VIEW  COMPARISON:  12/13/2014  FINDINGS: Cardiac shadow remains enlarged. Patchy infiltrate is noted in the right lung base. Some diffuse interstitial changes are noted bilaterally which may be related to superimposed congestive failure. This is more prominent than that seen on the prior exam.  IMPRESSION: Stable patchy right basilar infiltrate.  Increasing vascular congestion and interstitial edema.   Electronically Signed   By: [name] M.D.   On: 12/14/2014 10:57     CBC  Recent Labs Lab 12/18/14 1846 12/20/14 0325 12/21/14 0423 12/24/14 0540 12/25/14 0605  WBC 12.2* 15.4* 8.1 12.4* 9.8  HGB 13.8 13.4 13.3 12.3 11.8*  HCT 41.8 42.0 41.9 38.9 38.0  PLT 88* 133* 127* 145* 163  MCV 80.4 82.5 83.1 81.6 82.4  MCH 26.5 26.3 26.4 25.8* 25.6*  MCHC 33.0 31.9 31.7 31.6 31.1  RDW 19.6* 19.7* 19.9* 19.0* 19.3*    Chemistries   Recent Labs Lab 12/18/14 1846 12/19/14 0700  12/21/14 0423 12/22/14 0526 12/23/14 0502 12/24/14 0540 12/25/14 0605 12/25/14 1000  NA 130* 132*  < > 138 129* 131* 132* 136  --   K 4.9 5.8*  < > 3.6 4.9 4.3 4.7 3.4*  --   CL 97 99  < > 94* 91* 94* 96 96  --   CO2 24 24  < > 35* 28 27 28  37*  --   GLUCOSE 244* 373*  < > 161* 310* 226* 276* 226*  --   BUN 14 18  < > 17 28* 26* 21 19  --   CREATININE 0.96 1.07  < > 1.16* 1.79* 1.27* 0.88 0.96  --   CALCIUM 9.0 8.7  < > 8.0* 9.0 9.0 9.2 9.1  --   AST 56* 30  --   --   --   --   --   --  90*  ALT 63* 57*  --   --   --   --   --   --  357*  ALKPHOS 106 96  --   --   --   --   --   --  152*  BILITOT 2.0* 1.2  --   --   --   --   --   --  1.1  < > = values  in this interval not displayed. ------------------------------------------------------------------------------------------------------------------ estimated creatinine clearance is 109.9 mL/min (by C-G formula based on Cr of 0.96). ------------------------------------------------------------------------------------------------------------------ No results for input(s): HGBA1C in the last 72 hours. ------------------------------------------------------------------------------------------------------------------ No results for input(s): CHOL, HDL, LDLCALC, TRIG, CHOLHDL, LDLDIRECT in the last 72 hours. ------------------------------------------------------------------------------------------------------------------ No results for input(s): TSH, T4TOTAL, T3FREE, THYROIDAB in the last 72 hours.  Invalid input(s): FREET3 ------------------------------------------------------------------------------------------------------------------ No results for input(s): VITAMINB12, FOLATE, FERRITIN, TIBC, IRON, RETICCTPCT in the last 72 hours.  Coagulation profile  Recent Labs Lab 12/19/14 0115  INR 1.03    No results for input(s): DDIMER in the last 72 hours.  Cardiac Enzymes  Recent Labs Lab 12/19/14 0115 12/19/14 0700 12/19/14 1215  TROPONINI 0.05* <0.03 0.06*   ------------------------------------------------------------------------------------------------------------------ Invalid input(s): POCBNP     Time Spent in minutes   35   [name] K M.D on 12/25/2014 at 11:03 AM  Between 7am to 7pm - Pager - [phone]  After 7pm go to [email or web address] - password [facility]  [facility]   Office  [phone]

## 2014-12-25 NOTE — Consult Note (Signed)
[facility] Consult: 1:06 PM 12/25/2014  LOS: 7 days    Referring Provider: Dr Merita Norton  Primary Care Physician:  Dr Asa Lente.  Primary Gastroenterologist:  Previously scheduled with Dr Olevia Perches but pt had to cxl 11/27/2014 appt.     Reason for Consultation:  Pneumobilia. Abnormal LFTs.    HPI: [name] is a morbidly obese 49 y.o. female. 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 Hx LFT elevations date back several years. Hepatomegaly and fatty liver on 06/2013 ultrasound.   09/2009 Colonoscopy.  For anemia, FOBT +.  By Dr Angelique Blonder in [place], [place]. Descending colon polyp (path adenomatous), internal rrhoids.  Felt to have multifactorial anemia/anemia chronic disease. Pt also underwent and unremarkable EGD then as well, relays a hx of GERD well controlled with Nexium BID.  She has never had capsule endoscopy or ERCP.   3/16 - 3/25 admission with CAP, flare COPD.  hypercapneic resp failure.  Suffered from oversedation due to narcotics/benzo's worsening her resp situation.  Platelets acutely dropped from 145 to 76 but HIT level normal. Glucose in 200s to 370s.   Readmitted that same day of discharge, 3/25, with ongoing resp issues,  Worsening SOB. Aggressive diuresis for decompensated CHF was rec per Dr [name]. She is on 1500 cc fluid restrictions. Trial Solumedrol to prednisone initiated.  Anxiety is ever present and pt requests Ativan for CT scans.   3/24 VQ scan with perfusion defects, ? COPD vs PE.   CT angio chest 3/25:  No PE, +COPD, + pulm arterial htn.   CT chest without contrast 3/28: pneumobilia noted with 2 tiny gas locules though liver not completely visualized.  + fatty liver.  Abnormal lung parenchyma, progressed since 04/2010, presumably a manifestation of the patient's known pulmonary sarcoidosis. Pulmonary htn.    Abnormal LFTs. 12/25/14: t bili 1.1, alk phos 152, AST/ALT 90/357.   On 3/25 these were: t bili 2.0, alk phos 106, AST/ALT 56/63.  Reviewed previous LFTs and periodically alk phos into 160s dating back to 2014.  AST/ALTs also periodically elevated (up to 90s/190s in 06/2013).  t bili hardly ever abnormal.    Pt relays her liver "enzymes" had been elevated back several years when she lived in [place].  Has never been tested for Hep B/C. Never had pancreatitis.  Drinks infrequently though in her 78s drank more heavily.  Last ETOH was a margarita about one month ago.  Chronic constipation, BMs about 1 x per week.  Does not use laxatives. No blood in stool.   For several years has had occasional spells (several weeks/months to more than a year between occurrences) with grabbing pain in upper right abdomen which she treats with muscle relaxants. No nausea, no anorexia.  Periodic solid/pill dysphagia which has not accelerated. No N/V  Past Medical History  Diagnosis Date  . Hypertension   . Hyperlipidemia   .  Chronic headache   . Fibromyalgia     daily narcotics  . Anxiety     hx chronic BZ use, stopped 07/2010  . Anemia   . Pulmonary sarcoidosis     unimpressive CT chest 2011  . Colonic polyp   . GERD (gastroesophageal reflux disease)   . ALLERGIC RHINITIS   . Asthma   . CHF (congestive heart failure)     Diastolic with fluid overload, May, 2012, LVEF 60%  . Morbid obesity   . Depression   . Panic attacks   . Diabetes mellitus   . COPD (chronic obstructive pulmonary disease)     on home O2, moderate airflow obstruction, suspect d/t emphysema  . Obesity   . Elevated LFTs 09/2011  . Ovarian cyst   . Chronic back pain    . Sleep apnea      CPAP  . Fatty liver   . Esophagitis   . Gastritis   . Internal hemorrhoids   . Adenomatous colon polyp 12/03/09    [facility] in [place], [place]    Past Surgical History  Procedure Laterality Date  . Polypectomy  2011  . Lumbar microdiscectomy  07/06/2011    R L4-5, stern  . Back surgery    . Carpal tunnel release    . Steroid spinal injections    . Hysteroscopy w/d&c N/A 03/25/2013    Procedure: DILATATION AND CURETTAGE /HYSTEROSCOPY;  Surgeon: [name], MD;  Location: [facility];  Service: Gynecology;  Laterality: N/A;  . Dilation and curettage of uterus      Prior to Admission medications   Medication Sig Start Date End Date Taking? Authorizing Provider  acetaminophen (TYLENOL) 325 MG tablet Take 1,300 mg by mouth every 6 (six) hours as needed for moderate pain.    Yes Historical Provider, MD  albuterol (PROVENTIL) (2.5 MG/3ML) 0.083% nebulizer solution Take 3 mLs (2.5 mg total) by nebulization every 6 (six) hours as needed for wheezing or shortness of breath. 12/08/14  Yes Rowe Clack, MD  ALPRAZolam Duanne Moron) 1 MG tablet Take 1 mg by mouth 3 (three) times daily.   Yes Historical Provider, MD  aspirin EC 81 MG tablet Take 81 mg by mouth daily.   Yes Historical Provider, MD  BIOTIN PO Take 1 capsule by mouth daily.   Yes Historical Provider, MD  cetirizine (ZYRTEC) 1 MG/ML syrup TAKE 10 ML BY MOUTH EVERY NIGHT AT BEDTIME 09/07/14  Yes Rowe Clack, MD  Coenzyme Q10 (CO Q 10) 10 MG CAPS Take 1 capsule by mouth daily.   Yes Historical Provider, MD  diclofenac sodium (VOLTAREN) 1 % GEL Apply 4 g topically 4 (four) times daily. 12/18/14  Yes Debbe Odea, MD  esomeprazole (NEXIUM) 40 MG capsule Take 40 mg by mouth 2 (two) times daily before a meal.   Yes Historical Provider, MD  fluticasone (FLONASE) 50 MCG/ACT nasal spray Place 2 sprays into both nostrils daily. 10/19/14  Yes Rowe Clack, MD  gabapentin (NEURONTIN) 300 MG capsule Take 300 mg  by mouth at bedtime.   Yes Historical Provider, MD  glimepiride (AMARYL) 1 MG tablet Take 1 mg by mouth daily with breakfast.   Yes Historical Provider, MD  guaiFENesin (MUCINEX) 600 MG 12 hr tablet Take 2 tablets (1,200 mg total) by mouth 2 (two) times daily as needed for to loosen phlegm. 12/18/14  Yes Debbe Odea, MD  insulin aspart (NOVOLOG FLEXPEN) 100 UNIT/ML FlexPen Use as directed per sliding scale indicated by your physician.  Patient taking differently: Inject 0-11 Units into the skin 3 (three) times daily with meals. Use as directed per sliding scale indicated by your physician. 09/22/14  Yes Rowe Clack, MD  Insulin Glargine (LANTUS SOLOSTAR) 100 UNIT/ML Solostar Pen Inject 30 Units into the skin 2 (two) times daily. 12/18/14  Yes Debbe Odea, MD  Multiple Vitamin (MULTIVITAMIN) capsule Take 1 capsule by mouth daily.   Yes Historical Provider, MD  omega-3 fish oil (MAXEPA) 1000 MG CAPS capsule Take 1 capsule by mouth daily.   Yes Historical Provider, MD  Oxycodone HCl 10 MG TABS Take 1 tablet (10 mg total) by mouth every 6 (six) hours as needed. 12/18/14  Yes Debbe Odea, MD  potassium chloride SA (K-DUR,KLOR-CON) 20 MEQ tablet Take 2 tablets (40 mEq total) by mouth 2 (two) times daily. 11/13/14  Yes Rowe Clack, MD  PROAIR HFA 108 (90 BASE) MCG/ACT inhaler INHALE 2 PUFFS BY MOUTH EVERY 6 HOURS AS NEEDED 08/28/14  Yes Rowe Clack, MD  SPIRIVA HANDIHALER 18 MCG inhalation capsule INHALE CONTENTS OF 1 CAPSULE USING HANDIHALER EVERY DAY 12/16/14  Yes Kathee Delton, MD  SYMBICORT 160-4.5 MCG/ACT inhaler INHALE 2 PUFFS BY MOUTH TWICE DAILY 10/19/14  Yes Kathee Delton, MD  tiZANidine (ZANAFLEX) 4 MG tablet TAKE 1 TABLET BY MOUTH EVERY 8 HOURS AS NEEDED FOR MUSCLE SPASMS 11/10/14  Yes Rowe Clack, MD  torsemide (DEMADEX) 100 MG tablet Take 1 tablet (100 mg total) by mouth 2 (two) times daily. 07/30/14  Yes Biagio Borg, MD  varenicline (CHANTIX) 0.5 MG tablet Take 1 tablet  (0.5 mg total) by mouth 2 (two) times daily. 11/03/14  Yes Golden Circle, FNP  venlafaxine XR (EFFEXOR XR) 37.5 MG 24 hr capsule Take 1 capsule (37.5 mg total) by mouth daily with breakfast. 11/13/14  Yes Rowe Clack, MD  hydrOXYzine (ATARAX/VISTARIL) 10 MG tablet TAKE 1 TABLET BY MOUTH THREE TIMES DAILY AS NEEDED FOR ITCHING 08/27/14   Biagio Borg, MD  ibuprofen (ADVIL,MOTRIN) 400 MG tablet Take 1 tablet (400 mg total) by mouth every 6 (six) hours as needed for moderate pain. 12/18/14   Debbe Odea, MD    Scheduled Meds: . ALPRAZolam  0.5 mg Oral TID  . ampicillin-sulbactam (UNASYN) IV  3 g Intravenous Q6H  . antiseptic oral rinse  7 mL Mouth Rinse q12n4p  . aspirin EC  81 mg Oral Daily  . dextromethorphan-guaiFENesin  1 tablet Oral BID  . diclofenac sodium  4 g Topical QID  . fluticasone  2 spray Each Nare Daily  . folic acid  1 mg Oral Daily  . furosemide  40 mg Oral Daily  . gabapentin  300 mg Oral QHS  . insulin aspart  0-15 Units Subcutaneous TID WC  . insulin aspart  0-5 Units Subcutaneous QHS  . insulin aspart  5 Units Subcutaneous TID WC  . insulin glargine  45 Units Subcutaneous BID  . ipratropium-albuterol  3 mL Nebulization QID  . loratadine  10 mg Oral Daily  . multivitamin with minerals  1 tablet Oral Daily  . omega-3 acid ethyl esters  1 capsule Oral Daily  . pantoprazole  40 mg Oral Daily  . predniSONE  30 mg Oral Q breakfast  . sodium chloride  3 mL Intravenous Q12H  . thiamine  100 mg Oral Daily  . varenicline  0.5 mg Oral BID WC  . venlafaxine XR  37.5 mg Oral Q breakfast   Infusions:  PRN Meds: acetaminophen, guaiFENesin, hydrOXYzine, levalbuterol, [DISCONTINUED] ondansetron **OR** ondansetron (ZOFRAN) IV, oxyCODONE, tiZANidine   Allergies as of 12/18/2014 - Review Complete 12/18/2014  Allergen Reaction Noted  . Simvastatin Other (See Comments) 06/06/2013  . Sulfamethoxazole-trimethoprim Nausea And Vomiting   . Zolpidem tartrate Other (See  Comments) 08/03/2013  . Azithromycin Other (See Comments)   . Ceftin [cefuroxime] Swelling and Other (See Comments) 11/18/2014  . Doxycycline Other (See Comments)   . Latex Itching and Rash   . Metformin and related Diarrhea 09/01/2012  . Metolazone Other (See Comments) 12/10/2013  . Metronidazole Nausea And Vomiting 05/15/2012  . Ciprofloxacin Nausea Only   . Tramadol Itching and Nausea Only 12/06/2014    Family History  Problem Relation Age of Onset  . Hypertension Mother   . Emphysema Father   . Hypertension Father   . Stomach cancer Father   . Allergies Brother   . Hypertension Brother   . Stomach cancer Brother   . Heart disease Mother   . Heart disease Father   . Heart disease Brother     died age 41 sudden death/MI    History   Social History  . Marital Status: Legally Separated    Spouse Name: N/A  . Number of Children: N/A  . Years of Education: N/A   Occupational History  . Not on file.   Social History Main Topics  . Smoking status: Former Smoker -- 0.25 packs/day for 29 years    Types: Cigarettes    Quit date: 10/26/2014  . Smokeless tobacco: Never Used  . Alcohol Use: 0.0 oz/week    0 Standard drinks or equivalent per week     Comment: occasionally  . Drug Use: Yes    Special: Cocaine, Marijuana     Comment: remote cocaine; current marijuana  . Sexual Activity: Not on file   Other Topics Concern  . Not on file   Social History Narrative   Pt is single. No children, and disaability    REVIEW OF SYSTEMS: Constitutional:  Sedentary due to pain and dyspnea.  50 - 60 # weight gain in the last 12 months.  ENT:  No nose bleeds Pulm:  Per HPI.  Currently SOB improved, non-productive cough CV:  No palpitations, no LE edema.  GU:  No hematuria, no frequency GI:  Per HPI.   Heme:  Took iron in past for anemia.  No excessive bleeding/bruising tendency.    Transfusions:  None ever Neuro:  No headaches, no peripheral tingling or numbness Derm:  No  itching, no rash or sores.  Endocrine:  No sweats or chills.  No polyuria or dysuria Immunization:  Reviwed.  Multiple vaccines are up to date.  Travel:  None beyond local counties in last few months.    PHYSICAL EXAM: Vital signs in last 24 hours: Filed Vitals:   12/25/14 1116  BP: 111/58  Pulse:   Temp:   Resp:    Wt Readings from Last 3 Encounters:  12/25/14 318 lb 5.5 oz (144.4 kg)  12/06/14 296 lb (134.265 kg)  11/18/14 302 lb 3.2 oz (137.077 kg)    General: morbidly obese, comfortable, pleasant AAF Head:  Cushingoid faces  Eyes:  No icterus or conj pallor Ears:  Not HOH  Nose:  No congestion or discharge Mouth:  Clear moist, dental repair is good Neck:  No mass of JVD, + obese Lungs:  Diminished but clear bil.  voice is raspy.  Heart: RRR.  No MRG.   Abdomen:  Obese, soft, slight tenderness RUQ.  No mass, hernias, organomegaly or bruits appreciated. .   Rectal: not performed   Musc/Skeltl: no joint contractures or redness Extremities:  + LE edema, 1+.   Neurologic:  Oriented x 3, no tremor, no asterixis.  Good historian.   Skin:  No rash Tattoos:  Yes on right arm, chest and upper right back.  On 2 of the arm tattoos the skin is noticeably dryer with an intaglio like raised appearance to the inked skin.  Nodes:  No cervical adenopathy.    Psych:  Pleasant, relaxed, cooperative.   Intake/Output from previous day: 03/31 0701 - 04/01 0700 In: 1022 [P.O.:522; IV Piggyback:500] Out: 1050 [Urine:1050] Intake/Output this shift: Total I/O In: 240 [P.O.:240] Out: 1000 [Urine:1000]  LAB RESULTS:  Recent Labs  12/24/14 0540 12/25/14 0605  WBC 12.4* 9.8  HGB 12.3 11.8*  HCT 38.9 38.0  PLT 145* 163   BMET Lab Results  Component Value Date   NA 136 12/25/2014   NA 132* 12/24/2014   NA 131* 12/23/2014   K 3.4* 12/25/2014   K 4.7 12/24/2014   K 4.3 12/23/2014   CL 96 12/25/2014   CL 96 12/24/2014   CL 94* 12/23/2014   CO2 37* 12/25/2014   CO2 28  12/24/2014   CO2 27 12/23/2014   GLUCOSE 226* 12/25/2014   GLUCOSE 276* 12/24/2014   GLUCOSE 226* 12/23/2014   BUN 19 12/25/2014   BUN 21 12/24/2014   BUN 26* 12/23/2014   CREATININE 0.96 12/25/2014   CREATININE 0.88 12/24/2014   CREATININE 1.27* 12/23/2014   CALCIUM 9.1 12/25/2014   CALCIUM 9.2 12/24/2014   CALCIUM 9.0 12/23/2014   LFT  Recent Labs  12/25/14 1000  PROT 6.1  ALBUMIN 2.8*  AST 90*  ALT 357*  ALKPHOS 152*  BILITOT 1.1  BILIDIR 0.2  IBILI 0.9   PT/INR Lab Results  Component Value Date   INR 1.03 12/19/2014   INR 0.96 08/03/2013   Lipase     Component Value Date/Time   LIPASE 44 07/16/2013 2104    Drugs of Abuse     Component Value Date/Time   LABOPIA NONE DETECTED 12/23/2014 0928   COCAINSCRNUR NONE DETECTED 12/23/2014 0928   LABBENZ POSITIVE* 12/23/2014 0928   AMPHETMU NONE DETECTED 12/23/2014 0928   THCU NONE DETECTED 12/23/2014 0928   LABBARB NONE DETECTED 12/23/2014 0928      ENDOSCOPIC STUDIES: Per HPI:  EGD and colonoscopy in 2011.   IMPRESSION:   *  New finding of pneumobilia.  No previous ERCP/sphincterotomy.  LFTs elevated, this has periodically been noted dating back several years.. However transaminases are now higher than ever, pattern is not that of ETOH injury. She has no corresponding complaints suggestive of biliary colic.  Established Dx of fatty liver per ultrasound in 2014.   *  resp failure: multifactorial in pt with CHF-D, pulm htn, OSA/OHS, COPD, oxygen dependency, recent CAP. .   *  Recent thrombocytopenia, improving.  *  Adenomatous colon polyp 09/2009.  Pt asking if GI will perform colonoscopy while she is inpt.  I advised her that, given her significant resp issues, no colonoscopy planned for the near term.     PLAN:     *  Pt agreeable to proceed to MR for MRCP so order was placed with request that pt get a one time dose of IV Ativan pre procedure. She prefers to have this done tomorrow, feels she will be  more relaxed then.  Abdominal ultrasound ordered per Dr Candiss Norse.    Azucena Freed  12/25/2014, 1:06 PM Pager: [phone]    [facility] Attending  I have also seen and assessed the patient and agree with the advanced practitioner's assessment and plan. Cause of possible pneumobilia not clear. Elevated LFT's probably fatty liver. MR/MRCP should sort out. [place] + steatosis  Gatha Mayer, MD, [facility] [phone] (pager) 12/25/2014 6:11 PM

## 2014-12-25 NOTE — Progress Notes (Signed)
Subjective: Less dyspnea  Objective: Vital signs in last 24 hours: Blood pressure 111/58, pulse 84, temperature 97.6 F (36.4 C), temperature source Axillary, resp. rate 18, height _0  (1.727 m), weight 144.4 kg (318 lb 5.5 oz), last menstrual period 10/16/2012, SpO2 96 %.  Intake/Output from previous day: 03/31 0701 - 04/01 0700 In: 1022 [P.O.:522; IV Piggyback:500] Out: 1050 [Urine:1050]   Physical Exam:  General: Morbidly obese female in bed  Neuro: awake, alert, appropriate, conversational, MAE  CV: distant PULM: resps even, non labored, decreased bs, faint bibasilar crackles, no wheezing GI: Abd soft, NT/ND Extremities: 1+ BLE edema (improved), no cyanosis   Lab Results:  Recent Labs  12/24/14 0540 12/25/14 0605  WBC 12.4* 9.8  HGB 12.3 11.8*  HCT 38.9 38.0  PLT 145* 163   BMET  Recent Labs  12/23/14 0502 12/24/14 0540 12/25/14 0605  NA 131* 132* 136  K 4.3 4.7 3.4*  CL 94* 96 96  CO2 27 28 37*  GLUCOSE 226* 276* 226*  BUN 26* 21 19  CREATININE 1.27* 0.88 0.96  CALCIUM 9.0 9.2 9.1   Echo 12/21/14 >>>  EF 65 - 70%, grade 1 DD, mild MR, mod dilated RV and RA, severe PAH with PAP 106   Assessment/Plan:  Acute on chronic respiratory failure -- most likely related to decompensated diastolic heart failure, decompensated OHS/OSA.  Oxygenation better 12/25/14.  Her repeat CT chest shows GGO and some areas of nodularity that are progressed, and it is unclear if this is due to her edema, ?? Sarcoid (we have no documentation for this past diagnosis done out of state), or possibly a smoking related disease such as DIP?? (she did quit smoking in 10/2014). 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  ESR/ACE levels nml, BNP low   Recs: Diuresis as able F/u O2 requirements for improvement. Cont  trial of steroids Cont empiric abx - Unasyn with ??aspiration   Severe COPD with no acute bronchospasm on exam Recs: Continue  supplemental O2 and wean as able -- wears 3-4L baseline Continue BD's. Continue smoking cessation (states she quit 10/2014). Cont chantix   OSA/OHS - decompensated  Recs: Cont qhs cpap  Avoid oversedation   Severe pulmonary hypertension with PAP 106 on echo 12/20/14 - likely combination of group 2 due to diastolic heart failure and group 3 due to severe COPD, OSA/OHS, restrictive lung disease due to obesity.  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 Recs:  maintain SpO2 > 90% at all times. Continue diuresis as BP and SCr permit. Suspect that there would be little benefit from trial of advanced therapy (Sildenafil, Ambrisentan, Nifedipine, etc). Management of her COPD, diastolic dysfxn will be key here, as will treatment of possible interstitial lung disease if it exists (curently on steroid trial).  Weight loss encouraged.   ?pneumobilia -- seen on CT chest.  Recs:  Defer to [place]  Cell  [phone]  If no response or cell goes to voicemail, call beeper [phone]   12/25/2014, 12:42 PM [facility] [place]  [phone]  Cell  [phone]  If no response or cell goes to voicemail, call beeper [phone]

## 2014-12-25 NOTE — Consult Note (Signed)
   [facility] CM Inpatient Consult   12/25/2014  [name] [birth year] [id number]   Came to visit patient at bedside to discuss and offer [facility] services. She endorses she has an aide that comes to her home and that she does not feel like she needs [facility] follow up at this time. She was followed in the past by [facility]. However, she did not respond to calls from the [facility]. She declines services during this bedside visit.  [facility] brochure and contact information left at bedside to call if she changes her mind. Made inpatient RNCM aware of visit.   Marthenia Rolling, MSN-Ed, RN,BSN [facility] Liaison [phone]

## 2014-12-25 NOTE — Progress Notes (Signed)
Occupational Therapy Treatment Patient Details Name: [name] MRN: [id number] DOB: 1966/06/08 Today's Date: 12/25/2014    History of present illness Patient is a 49 yo female admitted 12/18/14 with acute on chronic resp failure, CHF.  Patient recently admitted 3/16 - 3/25 with pna, COPD, CHF (just d/c on day of admit).  PMH:  asthma, obesity, COPD, CHF, DM, depression, anxiety, HTN, fibromyalgia, polysubstance abuse, OSA on CPAP   OT comments  Pt with limited participation this pm and stated she was "in a bad mood". Completed education regarding HEP with theraband. Pt close to baseline level with ADL and will have a PCA after D/C. OT signing off.  Follow Up Recommendations  No OT follow up;Supervision - Intermittent    Equipment Recommendations  None recommended by OT                                              ADL    close to baseline level  Reviewed E conservation technqiues                                                                          Cognition   Behavior During Therapy: Flat affect Overall Cognitive Status: Within Functional Limits for tasks assessed                       Extremity/Trunk Assessment               Exercises Other Exercises Other Exercises: Educated pt on BUE strengthening with level2 theraband. Written HEP given. After demonstration, pt with minimal participation. pt states she was not in the mood to exercise and would do the exercises later    Pertinent Vitals/ Pain       Pain Assessment: No/denies pain  Home Living                                          Prior Functioning/Environment              Frequency       Progress Toward Goals  OT Goals(current goals can now be found in the care plan section)  Progress towards OT goals: Goals met/education completed, patient discharged from OT  Acute Rehab OT Goals Patient Stated Goal: to go home   OT Goal Formulation: With patient Time For Goal Achievement: 01/04/15 Potential to Achieve Goals: Good ADL Goals Pt Will Perform Grooming: with modified independence;standing Pt Will Transfer to Toilet: with modified independence;ambulating;regular height toilet;bedside commode;grab bars Pt Will Perform Toileting - Clothing Manipulation and hygiene: with modified independence;sit to/from stand Pt/caregiver will Perform Home Exercise Program: Increased strength;Right Upper extremity;Left upper extremity;With written HEP provided;With theraband;Independently Additional ADL Goal #1: Pt will be independent with energy conservation techniques   Plan All goals met and education completed, patient discharged from OT services    Co-evaluation                 End of Session  Activity Tolerance Other (comment) (Pt self limiting)   Patient Left in bed;with call bell/phone within reach   Nurse Communication Mobility status        Time: 2890-2284 OT Time Calculation (min): 14 min  Charges: OT General Charges $OT Visit: 1 Procedure OT Treatments $Therapeutic Activity: 8-22 mins  Donal Lynam,HILLARY 12/25/2014, 6:30 PM  [facility], OTR/L  [phone] 12/25/2014

## 2014-12-25 NOTE — Progress Notes (Signed)
Pt denies needing help with home PAP machine.

## 2014-12-26 ENCOUNTER — Inpatient Hospital Stay (HOSPITAL_COMMUNITY): Payer: Medicare Other

## 2014-12-26 DIAGNOSIS — K219 Gastro-esophageal reflux disease without esophagitis: Secondary | ICD-10-CM

## 2014-12-26 LAB — CBC
HEMATOCRIT: 40.8 % (ref 36.0–46.0)
Hemoglobin: 12.8 g/dL (ref 12.0–15.0)
MCH: 25.9 pg — ABNORMAL LOW (ref 26.0–34.0)
MCHC: 31.4 g/dL (ref 30.0–36.0)
MCV: 82.4 fL (ref 78.0–100.0)
PLATELETS: 188 10*3/uL (ref 150–400)
RBC: 4.95 MIL/uL (ref 3.87–5.11)
RDW: 19.5 % — AB (ref 11.5–15.5)
WBC: 9.4 10*3/uL (ref 4.0–10.5)

## 2014-12-26 LAB — COMPREHENSIVE METABOLIC PANEL
ALT: 368 U/L — AB (ref 0–35)
AST: 116 U/L — AB (ref 0–37)
Albumin: 3 g/dL — ABNORMAL LOW (ref 3.5–5.2)
Alkaline Phosphatase: 203 U/L — ABNORMAL HIGH (ref 39–117)
Anion gap: 7 (ref 5–15)
BUN: 16 mg/dL (ref 6–23)
CALCIUM: 8.9 mg/dL (ref 8.4–10.5)
CO2: 31 mmol/L (ref 19–32)
CREATININE: 0.97 mg/dL (ref 0.50–1.10)
Chloride: 98 mmol/L (ref 96–112)
GFR calc Af Amer: 79 mL/min — ABNORMAL LOW (ref >90)
GFR, EST NON AFRICAN AMERICAN: 68 mL/min — AB (ref >90)
Glucose, Bld: 113 mg/dL — ABNORMAL HIGH (ref 70–99)
Potassium: 3.8 mmol/L (ref 3.5–5.1)
Sodium: 136 mmol/L (ref 135–145)
TOTAL PROTEIN: 6.4 g/dL (ref 6.0–8.3)
Total Bilirubin: 1.2 mg/dL (ref 0.3–1.2)

## 2014-12-26 LAB — GLUCOSE, CAPILLARY
GLUCOSE-CAPILLARY: 210 mg/dL — AB (ref 70–99)
Glucose-Capillary: 111 mg/dL — ABNORMAL HIGH (ref 70–99)
Glucose-Capillary: 227 mg/dL — ABNORMAL HIGH (ref 70–99)
Glucose-Capillary: 278 mg/dL — ABNORMAL HIGH (ref 70–99)

## 2014-12-26 MED ORDER — LORAZEPAM 2 MG/ML IJ SOLN
1.0000 mg | Freq: Once | INTRAMUSCULAR | Status: AC
Start: 2014-12-26 — End: 2014-12-26
  Administered 2014-12-26: 1 mg via INTRAVENOUS
  Filled 2014-12-26: qty 1

## 2014-12-26 MED ORDER — INSULIN GLARGINE 100 UNIT/ML ~~LOC~~ SOLN
50.0000 [IU] | Freq: Two times a day (BID) | SUBCUTANEOUS | Status: DC
  Administered 2014-12-26 – 2014-12-28 (×4): 50 [IU] via SUBCUTANEOUS
  Filled 2014-12-26 (×5): qty 0.5

## 2014-12-26 MED ORDER — BENZONATATE 100 MG PO CAPS
200.0000 mg | ORAL_CAPSULE | Freq: Three times a day (TID) | ORAL | Status: DC | PRN
  Administered 2014-12-26 (×2): 200 mg via ORAL
  Filled 2014-12-26 (×3): qty 2

## 2014-12-26 MED ORDER — GADOBENATE DIMEGLUMINE 529 MG/ML IV SOLN
20.0000 mL | Freq: Once | INTRAVENOUS | Status: AC | PRN
  Administered 2014-12-26: 20 mL via INTRAVENOUS

## 2014-12-26 NOTE — Progress Notes (Signed)
Subjective: Coughing, got choked on pill "again" w tussive incontinence.  Objective: Vital signs in last 24 hours: Blood pressure 131/69, pulse 81, temperature 98.2 F (36.8 C), temperature source Axillary, resp. rate 18, height 5' 8"  (1.727 m), weight 145.3 kg (320 lb 5.3 oz), last menstrual period 10/16/2012, SpO2 99 %.  Intake/Output from previous day: 04/01 0701 - 04/02 0700 In: 640 [P.O.:640] Out: 3500 [Urine:3500]   Physical Exam: General: Morbidly obese female on BSC Neuro: awake, alert, appropriate, conversational, MAE  CV: distant PULM: resps even, non labored, decreased bs, faint bibasilar crackles, no wheezing, hard coughing GI: Abd soft, NT/ND Extremities: 1+ BLE edema (improved), no cyanosis   Lab Results:  Recent Labs  12/24/14 0540 12/25/14 0605 12/26/14 0636  WBC 12.4* 9.8 9.4  HGB 12.3 11.8* 12.8  HCT 38.9 38.0 40.8  PLT 145* 163 188   BMET  Recent Labs  12/24/14 0540 12/25/14 0605 12/26/14 0636  NA 132* 136 136  K 4.7 3.4* 3.8  CL 96 96 98  CO2 28 37* 31  GLUCOSE 276* 226* 113*  BUN 21 19 16   CREATININE 0.88 0.96 0.97  CALCIUM 9.2 9.1 8.9   Echo 12/21/14 >>>  EF 65 - 70%, grade 1 DD, mild MR, mod dilated RV and RA, severe PAH with PAP 106   Assessment/Plan:  Acute on chronic respiratory failure -- most likely related to decompensated diastolic heart failure, decompensated OHS/OSA.  Oxygenation better 12/25/14.  Her repeat CT chest shows GGO and some areas of nodularity that are progressed, and it is unclear if this is due to her edema, ?? Sarcoid (we have no documentation for this past diagnosis done out of state), or possibly a smoking related disease such as DIP?? (she did quit smoking in 10/2014). 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  ESR/ACE levels nml, BNP low She admits several episodes of pill dysphagia triggering cough.  Recs: Diuresis as able F/u O2 requirements for improvement. Cont  trial of  steroids Cont empiric abx - Unasyn with ??aspiration  SLP eval for swallowing eval  Severe COPD with no acute bronchospasm on exam Recs: Continue supplemental O2 and wean as able -- wears 3-4L baseline Continue BD's. Continue smoking cessation (states she quit 10/2014). Cont chantix   OSA/OHS - decompensated  Recs: Cont qhs cpap  Avoid oversedation   Severe pulmonary hypertension with PAP 106 on echo 12/20/14 - likely combination of group 2 due to diastolic heart failure and group 3 due to severe COPD, OSA/OHS, restrictive lung disease due to obesity.  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 Recs:  maintain SpO2 > 90% at all times. Continue diuresis as BP and SCr permit. Suspect that there would be little benefit from trial of advanced therapy (Sildenafil, Ambrisentan, Nifedipine, etc). Management of her COPD, diastolic dysfxn will be key here, as will treatment of possible interstitial lung disease if it exists (curently on steroid trial).  Weight loss encouraged.   ?pneumobilia -- seen on CT chest.  Recs:  Defer to [facility]   GERD w pill dysphagia triggering tussive incontinence Rec: SLP swallowing eval Stefanie Libel DMD [facility]  [phone]  Cell  [phone]  If no response or cell goes to voicemail, call beeper [phone]   12/26/2014, 9:55 AM [facility] [place]  Cell  [phone]  If no response or cell goes to voicemail, call beeper [phone]

## 2014-12-26 NOTE — Progress Notes (Signed)
   MRI/CP shows fatty liver but no other sig issues and no explanation for suspected pneumobilia. Think that was in error and think LFT changes fatty live +/- CHF. Nothing further to do with this now except will check HCV testing  Call if ?  Gatha Mayer, MD, [facility] [phone] (pager) 12/26/2014 10:14 AM

## 2014-12-26 NOTE — Evaluation (Signed)
Clinical/Bedside Swallow Evaluation Patient Details  Name: [name] MRN: [id number] Date of Birth: 07/31/1966  Today's Date: 12/26/2014 Time: SLP Start Time (ACUTE ONLY): 39 SLP Stop Time (ACUTE ONLY): 1144 SLP Time Calculation (min) (ACUTE ONLY): 14 min  Past Medical History:  Past Medical History  Diagnosis Date  . Hypertension   . Hyperlipidemia   . Chronic headache   . Fibromyalgia     daily narcotics  . Anxiety     hx chronic BZ use, stopped 07/2010  . Anemia   . Pulmonary sarcoidosis     unimpressive CT chest 2011  . Colonic polyp   . GERD (gastroesophageal reflux disease)   . ALLERGIC RHINITIS   . Asthma   . CHF (congestive heart failure)     Diastolic with fluid overload, May, 2012, LVEF 60%  . Morbid obesity   . Depression   . Panic attacks   . Diabetes mellitus   . COPD (chronic obstructive pulmonary disease)     on home O2, moderate airflow obstruction, suspect d/t emphysema  . Obesity   . Elevated LFTs 09/2011  . Ovarian cyst   . Chronic back pain   . Sleep apnea      CPAP  . Fatty liver   . Esophagitis   . Gastritis   . Internal hemorrhoids   . Adenomatous colon polyp 12/03/09    [facility] in [place], [place]   Past Surgical History:  Past Surgical History  Procedure Laterality Date  . Polypectomy  2011  . Lumbar microdiscectomy  07/06/2011    R L4-5, stern  . Back surgery    . Carpal tunnel release    . Steroid spinal injections    . Hysteroscopy w/d&c N/A 03/25/2013    Procedure: DILATATION AND CURETTAGE /HYSTEROSCOPY;  Surgeon: [name], MD;  Location: [facility];  Service: Gynecology;  Laterality: N/A;  . Dilation and curettage of uterus     HPI:  49 year old female admitted with acute on chronic respiratory failure-most likely related to decompensated diastolic hear failure, decompensated OHS/OSA. C/o dysphagia with pills. PMH of GERD, panic attacks, gastritis, esophagitis, obesity, CHF, allergic rhinitis.    Assessment / Plan  / Recommendation Clinical Impression  Patient presents with a suspected mild, primary esophageal dysphagia characterized by h/o GERD with c/o globus, "choking" with pills. Oropharyngeal swallow seemingly WFL based on bedside exam. Oral phase timely, swallow initiated and pos consumed without overt indication of oropharyngeal dysphagia or decreased airway protection. H/o COPD does increase risk for silent aspiration. Discussed with patient. Provided education regarding general safe swallow and esophageal precautions including use of small single cup sips, no straws, upright posture during and for 30 min-1 hour following po intake. No instrumental testing needed at this time. Will f/u briefly for continued education.     Aspiration Risk  Mild    Diet Recommendation Regular;Thin liquid   Liquid Administration via: No straw;Cup Medication Administration: Whole meds with liquid (one at a time) Supervision: Patient able to self feed;Intermittent supervision to cue for compensatory strategies Compensations: Slow rate;Small sips/bites Postural Changes and/or Swallow Maneuvers: Seated upright 90 degrees;Upright 30-60 min after meal    Other  Recommendations Oral Care Recommendations: Oral care BID   Follow Up Recommendations  None    Frequency and Duration min 1 x/week  1 week   Pertinent Vitals/Pain n/a     Swallow Study    General HPI: 49 year old female admitted with acute on chronic respiratory failure-most likely  related to decompensated diastolic hear failure, decompensated OHS/OSA. C/o dysphagia with pills. PMH of GERD, panic attacks, gastritis, esophagitis, obesity, CHF, allergic rhinitis.  Type of Study: Bedside swallow evaluation Previous Swallow Assessment: none noted Diet Prior to this Study: Regular;NPO Temperature Spikes Noted: No Respiratory Status: Nasal cannula History of Recent Intubation: No Behavior/Cognition: Alert;Cooperative;Pleasant mood Oral Cavity - Dentition:  Missing dentition Self-Feeding Abilities: Able to feed self Patient Positioning: Upright in chair Baseline Vocal Quality: Clear Volitional Cough: Strong Volitional Swallow: Able to elicit    Oral/Motor/Sensory Function Overall Oral Motor/Sensory Function: Appears within functional limits for tasks assessed   Ice Chips Ice chips: Not tested   Thin Liquid Thin Liquid: Within functional limits Presentation: Cup;Self Fed;Straw    Nectar Thick Nectar Thick Liquid: Not tested   Honey Thick Honey Thick Liquid: Not tested   Puree Puree: Within functional limits Presentation: Self Fed;Spoon   Solid   GO   Yvette Tiffany MA, CCC-SLP [phone]  Solid: Within functional limits Presentation: Self Fed       [name] 12/26/2014,11:59 AM

## 2014-12-26 NOTE — Progress Notes (Signed)
[facility] PROGRESS NOTE  [name] WFU:[id number] DOB: [birth year] DOA: 12/18/2014 PCP: No primary care provider on file.  Assessment/Plan: 49 y.o. female with past medical history of asthma, COPD, diastolic congestive heart failure (EF 55-60% with grade 1 diastolic dysfunction), diabetes mellitus, GERD, depression, anxiety disorder, who presents with worsening shortness of breath.  Patient was recently hospitalized from 3/16-3/25, and was treated for community acquired pneumonia, congestive heart failure exacerbation and COPD exacerbation. She completed 5 days of Levaquin treatment. Her flu PCR test was negative. She was just discharged home on 12/18/2014, she was admitted the same day with worsening respiratory failure.  - this admission: the initial opinion was patient was in fluid overload, she was diuresed, she developed acute renal failure and dehydration but her shortness of breath did not improve. 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 She was then placed on steroids with gradual improvement now. Still has significant oxygen demand and has room to improve. At baseline she is between 3-4 L nasal cannula oxygen currently requiring up to 8 L on ambulation.  1. Acute on chronic respiratory failure/advanced COPD/obesity hypoventilation syndrome/OSA, and chronic diastolic CHF with EF 20%. Now evidence of severe pulmonary hypertension/sarcodi PA pressure 106 Hgmm  -She is on 4 L nasal cannula oxygen but is worse and requiring 6-8 L, despite aggressive diuresis shortness of breath has not resolved, pulmonary has been following, now on a trial of steroids.  -CT scan of the lungs suggestive of possible sarcoid lung along with severe pulmonary artery hypertension which was noted on echogram. Echogram otherwise showing only mild grade 1 diastolic dysfunction. Further management per pulmonary. She has shown some clinical improvement on steroids   2. DM2; due to steroid- we  increased Lantus to 50 on 4/2, cont iSS, Will monitor CBGs; ha1c-9.4 3. History of smoking. Counseled to quit. 4. Thrombocytopenia. HRT was checked recently negative, no evidence of bleeding. We will monitor. 5. Alcohol abuse. CIWA protocol. 6. transaminates ? Liver congestion due to R sided HF; liver US/MRI: Diffuse hepatic steatosis; check hepatitis -MRI liver: fatty liver but no explanation for suspected pneumobilia; per GI probable error reading  7. Aspiration HCAP. Due to abusing narcotics in the hospital, she was caught injecting crushed narcotic pills into her IV site on 12/22/2014, she subsequently vomited, on CT scan likely aspiration.  -cont Unasyn and monitor. Has been counseled not to abuse narcotics.   Code Status: full Family Communication: d/w patient (indicate person spoken with, relationship, and if by phone, the number) Disposition Plan: pend PT, clinical improvement    Consultants:  Pulmonology   Procedures: TTE 12-21-14  - Left ventricle: The cavity size was normal. Wall thickness was increased in a pattern of moderate LVH. Systolic function was vigorous. The estimated ejection fraction was in the range of 65%to 70%. Doppler parameters are consistent with abnormal left ventricular relaxation (grade 1 diastolic dysfunction). Doppler parameters are consistent with high ventricular filling pressure. - Ventricular septum: The contour showed diastolic flattening and systolic flattening. - Mitral valve: There was mild regurgitation. - Right ventricle: The cavity size was moderately dilated. Systolic function was severely reduced. - Right atrium: The atrium was mildly dilated. - Atrial septum: The septum bowed from right to left, consistent with increased right atrial pressure. - Pulmonary arteries: Systolic pressure was severely increased. PA peak pressure: 106 mm Hg (S).  Impressions: Normal LV function; grade 1 diastolic dysfunction; RAE/RVE; severely reduced RV  function; severely elevated pulmonary pressure.    CTA Chest -  no PE, diffuse groundglass pattern   TTE April 2015  - Left ventricle: The cavity size was normal. There was moderate concentric hypertrophy. Systolic function wasnormal. The estimated ejection fraction was in the range of 55% to 60%. Although no diagnostic regional wall motionabnormality was identified, this possibility cannot becompletely excluded on the basis of this study. Doppler parameters are consistent with abnormal left ventricularrelaxation (grade 1 diastolic dysfunction). - Pulmonary arteries: PA peak pressure: 50mm Hg (S).  Antibiotics: Anti-infectives    Start   Dose/Rate Route Frequency Ordered Stop   12/23/14 1200  Ampicillin-Sulbactam (UNASYN) 3 g in sodium chloride 0.9 % 100 mL IVPB    3 g 100 mL/hr over 60 Minutes Intravenous Every 6 hours 12/23/14 1101           (indicate start date, and stop date if known)  HPI/Subjective: Alert, no distress   Objective: Filed Vitals:   12/26/14 0505  BP: 131/69  Pulse: 81  Temp: 98.2 F (36.8 C)  Resp: 18    Intake/Output Summary (Last 24 hours) at 12/26/14 1358 Last data filed at 12/25/14 2349  Gross per 24 hour  Intake    400 ml  Output   2500 ml  Net  -2100 ml   Filed Weights   12/24/14 0500 12/25/14 0523 12/26/14 0502  Weight: 137.9 kg (304 lb 0.2 oz) 144.4 kg (318 lb 5.5 oz) 145.3 kg (320 lb 5.3 oz)    Exam:   General:  alert  Cardiovascular: s1,s2 rrr  Respiratory: diminished AE at bases   Abdomen: soft, obese, nt  Musculoskeletal: LE edema   Data Reviewed: Basic Metabolic Panel:  Recent Labs Lab 12/22/14 0526 12/23/14 0502 12/24/14 0540 12/25/14 0605 12/26/14 0636  NA 129* 131* 132* 136 136  K 4.9 4.3 4.7 3.4* 3.8  CL 91* 94* 96 96 98  CO2 28 27 28  37* 31  GLUCOSE 310* 226* 276* 226* 113*  BUN 28* 26* 21 19 16   CREATININE 1.79* 1.27* 0.88 0.96 0.97  CALCIUM 9.0 9.0 9.2 9.1 8.9    Liver Function Tests:  Recent Labs Lab 12/25/14 1000 12/26/14 0636  AST 90* 116*  ALT 357* 368*  ALKPHOS 152* 203*  BILITOT 1.1 1.2  PROT 6.1 6.4  ALBUMIN 2.8* 3.0*   No results for input(s): LIPASE, AMYLASE in the last 168 hours. No results for input(s): AMMONIA in the last 168 hours. CBC:  Recent Labs Lab 12/20/14 0325 12/21/14 0423 12/24/14 0540 12/25/14 0605 12/26/14 0636  WBC 15.4* 8.1 12.4* 9.8 9.4  HGB 13.4 13.3 12.3 11.8* 12.8  HCT 42.0 41.9 38.9 38.0 40.8  MCV 82.5 83.1 81.6 82.4 82.4  PLT 133* 127* 145* 163 188   Cardiac Enzymes: No results for input(s): CKTOTAL, CKMB, CKMBINDEX, TROPONINI in the last 168 hours. BNP (last 3 results)  Recent Labs  12/09/14 0330 12/13/14 0820 12/18/14 1846  BNP 31.0 29.7 231.0*    ProBNP (last 3 results)  Recent Labs  02/11/14 0021 04/17/14 0120 07/20/14 1817  PROBNP 32.8 44.7 39.7    CBG:  Recent Labs Lab 12/25/14 1205 12/25/14 1704 12/25/14 2205 12/26/14 0920 12/26/14 1203  GLUCAP 200* 307* 250* 111* 227*    No results found for this or any previous visit (from the past 240 hour(s)).   Studies: Mr Jeananne Rama W/wo Cm/mrcp  12/26/2014   CLINICAL DATA:  Chronic right upper quadrant pain. Hepatic steatosis.  EXAM: MRI ABDOMEN WITHOUT AND WITH CONTRAST (INCLUDING MRCP)  TECHNIQUE: Multiplanar multisequence MR imaging of  the abdomen was performed both before and after the administration of intravenous contrast. Heavily T2-weighted images of the biliary and pancreatic ducts were obtained, and three-dimensional MRCP images were rendered by post processing.  CONTRAST:  74mL MULTIHANCE GADOBENATE DIMEGLUMINE 529 MG/ML IV SOLN  COMPARISON:  Chest CT on 12/22/2014, and AP CT on 10/12/2011  FINDINGS: Exam is technically degraded by motion artifact and large patient habitus and claustrophobia.  Lower chest: Bibasilar pulmonary opacity as better visualized on recent chest CT.  Hepatobiliary: Diffuse hepatic steatosis is  demonstrated. No focal liver masses are visualized.  Gallbladder is unremarkable. MRCP images are particularly degraded by artifact, however there is no definite evidence of biliary ductal dilatation.  Pancreas: No masses, inflammatory changes, or fluid collections demonstrated.  Spleen:  Within normal limits in size and appearance.  Adrenal Glands:  No masses identified.  Kidneys: No renal masses identified. No evidence of hydronephrosis.  Stomach/Bowel/Peritoneum: No evidence of wall thickening, mass, or obstruction involving visualized abdominal bowel.  Vascular/Lymphatic: No pathologically enlarged lymph nodes identified. No other significant abnormality noted.  Other:  Diffuse abdominal wall subcutaneous edema noted.  Musculoskeletal:  No suspicious bone lesions identified.  IMPRESSION: Technically degraded exam due to motion artifact and patient habitus. No definite evidence of gallbladder disease, biliary dilatation, or other acute findings.  Diffuse hepatic steatosis.   Electronically Signed   By: Earle Gell M.D.   On: 12/26/2014 09:26   US Abdomen Limited Ruq  12/25/2014   CLINICAL DATA:  Pneumobilia  EXAM: US ABDOMEN LIMITED - RIGHT UPPER QUADRANT  COMPARISON:  None.  FINDINGS: Gallbladder:  No gallstones or wall thickening visualized. No sonographic Murphy sign noted.  Common bile duct:  Diameter: 4 mm. Where visualized, no filling defect or evidence of pneumobilia.  Liver:  Increased liver echogenicity with coarsened texture. No focal lesion is seen. There is antegrade flow in the imaged portal venous system.  IMPRESSION: 1. No explanation for liver findings on chest CT 12/21/2014. 2. Hepatic steatosis.   Electronically Signed   By: Monte Fantasia M.D.   On: 12/25/2014 16:40    Scheduled Meds: . ALPRAZolam  0.5 mg Oral TID  . ampicillin-sulbactam (UNASYN) IV  3 g Intravenous Q6H  . antiseptic oral rinse  7 mL Mouth Rinse q12n4p  . aspirin EC  81 mg Oral Daily  . dextromethorphan-guaiFENesin   1 tablet Oral BID  . diclofenac sodium  4 g Topical QID  . fluticasone  2 spray Each Nare Daily  . folic acid  1 mg Oral Daily  . furosemide  40 mg Oral Daily  . gabapentin  300 mg Oral QHS  . insulin aspart  0-15 Units Subcutaneous TID WC  . insulin aspart  0-5 Units Subcutaneous QHS  . insulin aspart  5 Units Subcutaneous TID WC  . insulin glargine  45 Units Subcutaneous BID  . ipratropium-albuterol  3 mL Nebulization QID  . loratadine  10 mg Oral Daily  . multivitamin with minerals  1 tablet Oral Daily  . omega-3 acid ethyl esters  1 capsule Oral Daily  . pantoprazole  40 mg Oral Daily  . predniSONE  30 mg Oral Q breakfast  . sodium chloride  3 mL Intravenous Q12H  . thiamine  100 mg Oral Daily  . varenicline  0.5 mg Oral BID WC  . venlafaxine XR  37.5 mg Oral Q breakfast   Continuous Infusions:   Principal Problem:   Acute-on-chronic respiratory failure Active Problems:   PULMONARY SARCOIDOSIS  HLD (hyperlipidemia)   Morbid obesity   Anxiety state   Chronic respiratory failure   GERD   Obstructive sleep apnea   Diabetes type 2, uncontrolled   Depression   Chronic diastolic CHF (congestive heart failure)   CHF (congestive heart failure), NYHA class I   SOB (shortness of breath)   COPD with exacerbation   Other emphysema   Lung infiltrate    Time spent: >35 minutes     Kinnie Feil  [facility] Pager [phone]. If 7PM-7AM, please contact night-coverage at [email or web address], password [facility] 12/26/2014, 1:58 PM  LOS: 8 days

## 2014-12-27 LAB — GLUCOSE, CAPILLARY
Glucose-Capillary: 70 mg/dL (ref 70–99)
Glucose-Capillary: 130 mg/dL — ABNORMAL HIGH (ref 70–99)
Glucose-Capillary: 254 mg/dL — ABNORMAL HIGH (ref 70–99)
Glucose-Capillary: 226 mg/dL — ABNORMAL HIGH (ref 70–99)

## 2014-12-27 LAB — COMPREHENSIVE METABOLIC PANEL
ALT: 249 U/L — ABNORMAL HIGH (ref 0–35)
AST: 62 U/L — AB (ref 0–37)
Albumin: 2.8 g/dL — ABNORMAL LOW (ref 3.5–5.2)
Alkaline Phosphatase: 165 U/L — ABNORMAL HIGH (ref 39–117)
Anion gap: 10 (ref 5–15)
BUN: 16 mg/dL (ref 6–23)
CHLORIDE: 93 mmol/L — AB (ref 96–112)
CO2: 30 mmol/L (ref 19–32)
Calcium: 8.9 mg/dL (ref 8.4–10.5)
Creatinine, Ser: 1.04 mg/dL (ref 0.50–1.10)
GFR calc non Af Amer: 63 mL/min — ABNORMAL LOW (ref >90)
GFR, EST AFRICAN AMERICAN: 73 mL/min — AB (ref >90)
GLUCOSE: 297 mg/dL — AB (ref 70–99)
Potassium: 4.3 mmol/L (ref 3.5–5.1)
Sodium: 133 mmol/L — ABNORMAL LOW (ref 135–145)
Total Bilirubin: 0.9 mg/dL (ref 0.3–1.2)
Total Protein: 6.5 g/dL (ref 6.0–8.3)

## 2014-12-27 MED ORDER — SENNOSIDES-DOCUSATE SODIUM 8.6-50 MG PO TABS
1.0000 | ORAL_TABLET | Freq: Two times a day (BID) | ORAL | Status: DC
  Administered 2014-12-27 – 2014-12-28 (×3): 1 via ORAL
  Filled 2014-12-27 (×3): qty 1

## 2014-12-27 MED ORDER — TORSEMIDE 100 MG PO TABS
100.0000 mg | ORAL_TABLET | Freq: Every day | ORAL | Status: DC
  Administered 2014-12-27 – 2014-12-28 (×2): 100 mg via ORAL
  Filled 2014-12-27 (×2): qty 1

## 2014-12-27 MED ORDER — POLYETHYLENE GLYCOL 3350 17 G PO PACK
17.0000 g | PACK | Freq: Every day | ORAL | Status: DC
  Administered 2014-12-27 – 2014-12-28 (×2): 17 g via ORAL
  Filled 2014-12-27 (×2): qty 1

## 2014-12-27 MED ORDER — TORSEMIDE 100 MG PO TABS: 100.0000 mg | ORAL_TABLET | Freq: Every day | ORAL | Status: DC

## 2014-12-27 MED ORDER — MORPHINE SULFATE 2 MG/ML IJ SOLN
2.0000 mg | Freq: Once | INTRAMUSCULAR | Status: AC
  Administered 2014-12-27: 2 mg via INTRAVENOUS
  Filled 2014-12-27: qty 1

## 2014-12-27 MED ORDER — TORSEMIDE 100 MG PO TABS
100.0000 mg | ORAL_TABLET | Freq: Every day | ORAL | Status: DC
  Filled 2014-12-27: qty 1

## 2014-12-27 MED ORDER — FLUCONAZOLE 100 MG PO TABS
100.0000 mg | ORAL_TABLET | Freq: Once | ORAL | Status: AC
  Administered 2014-12-27: 100 mg via ORAL
  Filled 2014-12-27: qty 1

## 2014-12-27 NOTE — Progress Notes (Signed)
[facility] PROGRESS NOTE  [name] LKG:[id number] DOB: 03-20-1966 DOA: 12/18/2014 PCP: No primary care provider on file.  Assessment/Plan: 49 y.o. female with past medical history of asthma, COPD, diastolic congestive heart failure (EF 55-60% with grade 1 diastolic dysfunction), diabetes mellitus, GERD, depression, anxiety disorder, who presents with worsening shortness of breath.  Patient was recently hospitalized from 3/16-3/25, and was treated for community acquired pneumonia, congestive heart failure exacerbation and COPD exacerbation. She completed 5 days of Levaquin treatment. Her flu PCR test was negative. She was just discharged home on 12/18/2014, she was admitted the same day with worsening respiratory failure.  -This admission: the initial opinion was patient was in fluid overload, she was diuresed, she developed acute renal failure and dehydration but her shortness of breath did not improve. 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 She was then placed on steroids with gradual improvement now. Still has significant oxygen demand and has room to improve. At baseline she is between 3-4 L nasal cannula oxygen currently requiring up to 8 L on ambulation.  1. Acute on chronic respiratory failure/advanced COPD/obesity hypoventilation syndrome/OSA, and chronic diastolic CHF with EF 66%. Now evidence of severe pulmonary hypertension/sarcodi PA pressure 106 Hgmm  -She is on 4 L nasal cannula oxygen but is worse and requiring 6-8 L, despite aggressive diuresis shortness of breath has not resolved, pulmonary has been following, now on a trial of steroids.  -CT scan of the lungs suggestive of possible sarcoid lung along with severe pulmonary artery hypertension which was noted on echogram. Echogram otherwise showing only mild grade 1 diastolic dysfunction. Further management per pulmonary. She has shown some clinical improvement on steroids  -patient reports being on  torsemide at home (200mg ); says lasix will not work for her. clinically she is still fluid overloaded, severe pulmonary HTN; will d/c lasix, start torsemide 100 mg if BP allows; monitor renal function   2. DM2; due to steroid- we increased Lantus to 50 on 4/2, cont iSS, Will monitor CBGs; ha1c-9.4 3. History of smoking. Counseled to quit. 4. Thrombocytopenia. HRT was checked recently negative, no evidence of bleeding. We will monitor. 5. Alcohol abuse. CIWA protocol. 6. transaminates ? Liver congestion due to R sided HF; liver US/MRI: Diffuse hepatic steatosis; check hepatitis -MRI liver: fatty liver but no explanation for suspected pneumobilia; per GI probable error reading  7. Aspiration HCAP. Due to abusing narcotics in the hospital, she was caught injecting crushed narcotic pills into her IV site on 12/22/2014, she subsequently vomited, on CT scan likely aspiration.  -cont Unasyn and monitor. Has been counseled not to abuse narcotics. 8. Constipation; started on bowel regimen;    Code Status: full Family Communication: d/w patient (indicate person spoken with, relationship, and if by phone, the number) Disposition Plan: pend PT, clinical improvement, PT eval    Consultants:  Pulmonology   Procedures: TTE 12-21-14  - Left ventricle: The cavity size was normal. Wall thickness was increased in a pattern of moderate LVH. Systolic function was vigorous. The estimated ejection fraction was in the range of 65%to 70%. Doppler parameters are consistent with abnormal left ventricular relaxation (grade 1 diastolic dysfunction). Doppler parameters are consistent with high ventricular filling pressure. - Ventricular septum: The contour showed diastolic flattening and systolic flattening. - Mitral valve: There was mild regurgitation. - Right ventricle: The cavity size was moderately dilated. Systolic function was severely reduced. - Right atrium: The atrium was mildly dilated. - Atrial septum:  The septum bowed from right  to left, consistent with increased right atrial pressure. - Pulmonary arteries: Systolic pressure was severely increased. PA peak pressure: 106 mm Hg (S).  Impressions: Normal LV function; grade 1 diastolic dysfunction; RAE/RVE; severely reduced RV function; severely elevated pulmonary pressure.    CTA Chest - no PE, diffuse groundglass pattern   TTE April 2015  - Left ventricle: The cavity size was normal. There was moderate concentric hypertrophy. Systolic function wasnormal. The estimated ejection fraction was in the range of 55% to 60%. Although no diagnostic regional wall motionabnormality was identified, this possibility cannot becompletely excluded on the basis of this study. Doppler parameters are consistent with abnormal left ventricularrelaxation (grade 1 diastolic dysfunction). - Pulmonary arteries: PA peak pressure: 63mm Hg (S).  Antibiotics: Anti-infectives    Start   Dose/Rate Route Frequency Ordered Stop   12/23/14 1200  Ampicillin-Sulbactam (UNASYN) 3 g in sodium chloride 0.9 % 100 mL IVPB    3 g 100 mL/hr over 60 Minutes Intravenous Every 6 hours 12/23/14 1101           (indicate start date, and stop date if known)  HPI/Subjective: Alert, no distress   Objective: Filed Vitals:   12/27/14 0729  BP: 103/57  Pulse: 80  Temp:   Resp:     Intake/Output Summary (Last 24 hours) at 12/27/14 1053 Last data filed at 12/27/14 0701  Gross per 24 hour  Intake      0 ml  Output   2800 ml  Net  -2800 ml   Filed Weights   12/24/14 0500 12/25/14 0523 12/26/14 0502  Weight: 137.9 kg (304 lb 0.2 oz) 144.4 kg (318 lb 5.5 oz) 145.3 kg (320 lb 5.3 oz)    Exam:   General:  alert  Cardiovascular: s1,s2 rrr  Respiratory: diminished AE at bases   Abdomen: soft, obese, nt  Musculoskeletal: LE edema   Data Reviewed: Basic Metabolic Panel:  Recent Labs Lab 12/22/14 0526 12/23/14 0502  12/24/14 0540 12/25/14 0605 12/26/14 0636  NA 129* 131* 132* 136 136  K 4.9 4.3 4.7 3.4* 3.8  CL 91* 94* 96 96 98  CO2 28 27 28  37* 31  GLUCOSE 310* 226* 276* 226* 113*  BUN 28* 26* 21 19 16   CREATININE 1.79* 1.27* 0.88 0.96 0.97  CALCIUM 9.0 9.0 9.2 9.1 8.9   Liver Function Tests:  Recent Labs Lab 12/25/14 1000 12/26/14 0636  AST 90* 116*  ALT 357* 368*  ALKPHOS 152* 203*  BILITOT 1.1 1.2  PROT 6.1 6.4  ALBUMIN 2.8* 3.0*   No results for input(s): LIPASE, AMYLASE in the last 168 hours. No results for input(s): AMMONIA in the last 168 hours. CBC:  Recent Labs Lab 12/21/14 0423 12/24/14 0540 12/25/14 0605 12/26/14 0636  WBC 8.1 12.4* 9.8 9.4  HGB 13.3 12.3 11.8* 12.8  HCT 41.9 38.9 38.0 40.8  MCV 83.1 81.6 82.4 82.4  PLT 127* 145* 163 188   Cardiac Enzymes: No results for input(s): CKTOTAL, CKMB, CKMBINDEX, TROPONINI in the last 168 hours. BNP (last 3 results)  Recent Labs  12/09/14 0330 12/13/14 0820 12/18/14 1846  BNP 31.0 29.7 231.0*    ProBNP (last 3 results)  Recent Labs  02/11/14 0021 04/17/14 0120 07/20/14 1817  PROBNP 32.8 44.7 39.7    CBG:  Recent Labs Lab 12/26/14 0920 12/26/14 1203 12/26/14 1726 12/26/14 2124 12/27/14 0845  GLUCAP 111* 227* 278* 210* 70    No results found for this or any previous visit (from the past 240  hour(s)).   Studies: Mr Jeananne Rama W/wo Cm/mrcp  12/26/2014   CLINICAL DATA:  Chronic right upper quadrant pain. Hepatic steatosis.  EXAM: MRI ABDOMEN WITHOUT AND WITH CONTRAST (INCLUDING MRCP)  TECHNIQUE: Multiplanar multisequence MR imaging of the abdomen was performed both before and after the administration of intravenous contrast. Heavily T2-weighted images of the biliary and pancreatic ducts were obtained, and three-dimensional MRCP images were rendered by post processing.  CONTRAST:  78mL MULTIHANCE GADOBENATE DIMEGLUMINE 529 MG/ML IV SOLN  COMPARISON:  Chest CT on 12/22/2014, and [facility] CT on 10/12/2011  FINDINGS:  Exam is technically degraded by motion artifact and large patient habitus and claustrophobia.  Lower chest: Bibasilar pulmonary opacity as better visualized on recent chest CT.  Hepatobiliary: Diffuse hepatic steatosis is demonstrated. No focal liver masses are visualized.  Gallbladder is unremarkable. MRCP images are particularly degraded by artifact, however there is no definite evidence of biliary ductal dilatation.  Pancreas: No masses, inflammatory changes, or fluid collections demonstrated.  Spleen:  Within normal limits in size and appearance.  Adrenal Glands:  No masses identified.  Kidneys: No renal masses identified. No evidence of hydronephrosis.  Stomach/Bowel/Peritoneum: No evidence of wall thickening, mass, or obstruction involving visualized abdominal bowel.  Vascular/Lymphatic: No pathologically enlarged lymph nodes identified. No other significant abnormality noted.  Other:  Diffuse abdominal wall subcutaneous edema noted.  Musculoskeletal:  No suspicious bone lesions identified.  IMPRESSION: Technically degraded exam due to motion artifact and patient habitus. No definite evidence of gallbladder disease, biliary dilatation, or other acute findings.  Diffuse hepatic steatosis.   Electronically Signed   By: Earle Gell M.D.   On: 12/26/2014 09:26   US Abdomen Limited Ruq  12/25/2014   CLINICAL DATA:  Pneumobilia  EXAM: US ABDOMEN LIMITED - RIGHT UPPER QUADRANT  COMPARISON:  None.  FINDINGS: Gallbladder:  No gallstones or wall thickening visualized. No sonographic Murphy sign noted.  Common bile duct:  Diameter: 4 mm. Where visualized, no filling defect or evidence of pneumobilia.  Liver:  Increased liver echogenicity with coarsened texture. No focal lesion is seen. There is antegrade flow in the imaged portal venous system.  IMPRESSION: 1. No explanation for liver findings on chest CT 12/21/2014. 2. Hepatic steatosis.   Electronically Signed   By: Monte Fantasia M.D.   On: 12/25/2014 16:40     Scheduled Meds: . ALPRAZolam  0.5 mg Oral TID  . ampicillin-sulbactam (UNASYN) IV  3 g Intravenous Q6H  . antiseptic oral rinse  7 mL Mouth Rinse q12n4p  . aspirin EC  81 mg Oral Daily  . dextromethorphan-guaiFENesin  1 tablet Oral BID  . diclofenac sodium  4 g Topical QID  . fluticasone  2 spray Each Nare Daily  . folic acid  1 mg Oral Daily  . furosemide  40 mg Oral Daily  . gabapentin  300 mg Oral QHS  . insulin aspart  0-15 Units Subcutaneous TID WC  . insulin aspart  0-5 Units Subcutaneous QHS  . insulin aspart  5 Units Subcutaneous TID WC  . insulin glargine  50 Units Subcutaneous BID  . ipratropium-albuterol  3 mL Nebulization QID  . loratadine  10 mg Oral Daily  . multivitamin with minerals  1 tablet Oral Daily  . omega-3 acid ethyl esters  1 capsule Oral Daily  . pantoprazole  40 mg Oral Daily  . predniSONE  30 mg Oral Q breakfast  . sodium chloride  3 mL Intravenous Q12H  . thiamine  100 mg Oral Daily  .  varenicline  0.5 mg Oral BID WC  . venlafaxine XR  37.5 mg Oral Q breakfast   Continuous Infusions:   Principal Problem:   Acute-on-chronic respiratory failure Active Problems:   PULMONARY SARCOIDOSIS   HLD (hyperlipidemia)   Morbid obesity   Anxiety state   Chronic respiratory failure   GERD   Obstructive sleep apnea   Diabetes type 2, uncontrolled   Depression   Chronic diastolic CHF (congestive heart failure)   CHF (congestive heart failure), NYHA class I   SOB (shortness of breath)   COPD with exacerbation   Other emphysema   Lung infiltrate    Time spent: >35 minutes     Kinnie Feil  [facility] Pager [phone]. If 7PM-7AM, please contact night-coverage at [email or web address], password [facility] 12/27/2014, 10:53 AM  LOS: 9 days

## 2014-12-27 NOTE — Progress Notes (Signed)
RT entered room to place patient on CPAP and patient stated she did not need assistance with CPAP she would place herself on when ready.

## 2014-12-28 LAB — POTASSIUM: Potassium: 3.8 mmol/L (ref 3.5–5.1)

## 2014-12-28 LAB — COMPREHENSIVE METABOLIC PANEL
ALK PHOS: 141 U/L — AB (ref 39–117)
ALT: 212 U/L — AB (ref 0–35)
AST: 38 U/L — AB (ref 0–37)
Albumin: 2.9 g/dL — ABNORMAL LOW (ref 3.5–5.2)
Anion gap: 12 (ref 5–15)
BUN: 15 mg/dL (ref 6–23)
CO2: 35 mmol/L — AB (ref 19–32)
Calcium: 8.6 mg/dL (ref 8.4–10.5)
Chloride: 94 mmol/L — ABNORMAL LOW (ref 96–112)
Creatinine, Ser: 0.83 mg/dL (ref 0.50–1.10)
GFR, EST NON AFRICAN AMERICAN: 83 mL/min — AB (ref >90)
Glucose, Bld: 97 mg/dL (ref 70–99)
POTASSIUM: 4 mmol/L (ref 3.5–5.1)
SODIUM: 141 mmol/L (ref 135–145)
Total Bilirubin: 0.8 mg/dL (ref 0.3–1.2)
Total Protein: 6.3 g/dL (ref 6.0–8.3)

## 2014-12-28 LAB — GLUCOSE, CAPILLARY
GLUCOSE-CAPILLARY: 102 mg/dL — AB (ref 70–99)
Glucose-Capillary: 152 mg/dL — ABNORMAL HIGH (ref 70–99)

## 2014-12-28 LAB — HEPATITIS PANEL, ACUTE
HCV Ab: NEGATIVE
HEP A IGM: NONREACTIVE
HEP B C IGM: NONREACTIVE
Hepatitis B Surface Ag: NEGATIVE

## 2014-12-28 MED ORDER — TIZANIDINE HCL 4 MG PO TABS: 4.0000 mg | ORAL_TABLET | Freq: Three times a day (TID) | ORAL | Status: DC | PRN

## 2014-12-28 MED ORDER — PREDNISONE 10 MG PO TABS: ORAL_TABLET | ORAL | Status: DC

## 2014-12-28 MED ORDER — INSULIN ASPART 100 UNIT/ML FLEXPEN: 5.0000 [IU] | PEN_INJECTOR | Freq: Three times a day (TID) | SUBCUTANEOUS | Status: DC

## 2014-12-28 MED ORDER — ALPRAZOLAM 1 MG PO TABS: 0.5000 mg | ORAL_TABLET | Freq: Three times a day (TID) | ORAL | Status: DC

## 2014-12-28 MED ORDER — AMOXICILLIN-POT CLAVULANATE 875-125 MG PO TABS: 1.0000 | ORAL_TABLET | Freq: Two times a day (BID) | ORAL | Status: DC

## 2014-12-28 MED ORDER — INSULIN GLARGINE 100 UNIT/ML SOLOSTAR PEN: 50.0000 [IU] | PEN_INJECTOR | Freq: Two times a day (BID) | SUBCUTANEOUS | Status: DC

## 2014-12-28 NOTE — Progress Notes (Signed)
Inpatient Diabetes Program Recommendations  AACE/ADA: New Consensus Statement on Inpatient Glycemic Control (2013)  Target Ranges:  Prepandial:   less than 140 mg/dL      Peak postprandial:   less than 180 mg/dL (1-2 hours)      Critically ill patients:  140 - 180 mg/dL   Results for [name], [name] (MRN [id number]) as of 12/28/2014 11:30  Ref. Range 12/27/2014 08:45 12/27/2014 12:15 12/27/2014 18:02 12/27/2014 21:46 12/28/2014 07:51  Glucose-Capillary Latest Range: 70-99 mg/dL 70 130 (H) 254 (H) 226 (H) 102 (H)   Diabetes history: DM2 Outpatient Diabetes medications: Lantus 30 units BID, Amaryl 1 mg QAM, Novolog 0-11 units TID with meals Current orders for Inpatient glycemic control:Lantus 50 units BID, Novolog 0-15 units TID with meals, Novolog 0-5 units HS, Novolog 5 units TID with meals  Inpatient Diabetes Program Recommendations Insulin - Meal Coverage: Please consider increasing meal coverage to Novolog 8 units TID with meals if patient eats at least 50% of meals.  Thanks, Barnie Alderman, RN, MSN, CCRN, CDE Diabetes Coordinator Inpatient Diabetes Program [phone] (Team Pager from [place] to [place]) [phone] ([facility] office) [phone] [facility] office)

## 2014-12-28 NOTE — Progress Notes (Signed)
Ms. Kim is a MO(303 lb) with known OSA and is Cpap dependent and compliant who was admitted 3/16 with SOB/Htn and anxiety. CAP was suspected but not supported by CxR or culture data but was on steroids and Cefepime. Early am 3/21 was noted to be desaturating and lethargic. Review of medications reveals: Xanax 81m at 3/30 2233, Ativan 0.5 mg IV at 3/20 2230, Oxy IR at 3/21 at 0335. Transferred to ICU 3/21 at 0910 for AMS and desaturations after being taken off her Cpap.PCCM asked to evaluate in the ICU for questionable pulmonary embolism. Currently 3/21 10:15 on [place] and sats 94%, awake and alert. Note PMH is significant for pulmonary sarcoid. She is followed by Dr. CGwenette Greetfor OSA/OHS COPD and chronic resp failure. Continues to smoke despite the severity of her lung disease.  Subjective: No distress.   Objective: Vital signs in last 24 hours: Blood pressure 124/74, pulse 75, temperature 98.6 F (37 C), temperature source Oral, resp. rate 18, height 5' 8"  (1.727 m), weight 148.4 kg (327 lb 2.6 oz), last menstrual period 10/16/2012, SpO2 94 %.  Intake/Output from previous day: 04/03 0701 - 04/04 0700 In: 2380 [P.O.:1280; IV Piggyback:1100] Out: 8900 [Urine:8900]   Physical Exam: General: Morbidly obese female on BSC Neuro: awake, alert, appropriate, conversational, MAE  CV: distant PULM: resps even, non labored, decreased bs GI: Abd soft, NT/ND Extremities: 1+ BLE edema (improved), no cyanosis   Lab Results:  Recent Labs  12/26/14 0636  WBC 9.4  HGB 12.8  HCT 40.8  PLT 188   BMET  Recent Labs  12/26/14 0636 12/27/14 1520 12/28/14 0040 12/28/14 0617  NA 136 133*  --  141  K 3.8 4.3 3.8 4.0  CL 98 93*  --  94*  CO2 31 30  --  35*  GLUCOSE 113* 297*  --  97  BUN 16 16  --  15  CREATININE 0.97 1.04  --  0.83  CALCIUM 8.9 8.9  --  8.6   Echo 12/21/14 >>>  EF 65 - 70%, grade 1 DD, mild MR, mod dilated RV and RA, severe PAH with PAP 106   Assessment/Plan:  1)  Acute on chronic respiratory failure -- most likely related to decompensated diastolic heart failure, decompensated OHS/OSA w/ decompensated PAH and cor pulmonale. 2) Possible ILD:  CT chest shows GGO and some areas of nodularity that are progressed, and it is unclear if this is due to her edema, ?? Sarcoid (we have no documentation for this past diagnosis done out of state), or possibly a smoking related disease such as DIP?? (she did quit smoking in 10/2014). 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 She admits several episodes of pill dysphagia triggering cough. 5) GERD  6) pill dysphagia  7) narcotic abuse -->caught attempting to inject crushed narcotics into IV site.   Recs: Cont Diuresis as able F/u O2 requirements for improvement. Wean steroids to off Could change unasyn to augmentin and complete 8d total rx  Cont qhs cpap  Avoid oversedation  Continue supplemental O2 and wean as able -- wears 3-4L baseline Continue BD's. Continue smoking cessation (states she quit 10/2014). Cont chantix   maintain SpO2 > 90% at all times. Suspect that there would be little benefit from trial of advanced therapy (  Sildenafil, Ambrisentan, Nifedipine, etc). She has f/u w/ [facility] already-->she can be discharged from our standpoint.    Management of her COPD, diastolic dysfxn will be key here, as will treatment of possible interstitial lung disease if it exists (curently on steroid trial).  Weight loss encouraged.    BABCOCK,PETE

## 2014-12-28 NOTE — Discharge Summary (Signed)
PATIENT DETAILS Name: [name] Age: 49 y.o. Sex: female Date of Birth: 1966/02/14 MRN: [id number]. Admitting Physician: Ivor Costa, MD PYP:[id number],ZTIW S, MD  Admit Date: 12/18/2014 Discharge date: 12/28/2014  Recommendations for Outpatient Follow-up:  1. Please check CBC and chemistry panel at next follow-up 2. Please check LFTs at next visit  PRIMARY DISCHARGE DIAGNOSIS:  Principal Problem:   Acute-on-chronic respiratory failure Active Problems:   PULMONARY SARCOIDOSIS   HLD (hyperlipidemia)   Morbid obesity   Anxiety state   Chronic respiratory failure   GERD   Obstructive sleep apnea   Diabetes type 2, uncontrolled   Depression   Chronic diastolic CHF (congestive heart failure)   CHF (congestive heart failure), NYHA class I   SOB (shortness of breath)   COPD with exacerbation   Other emphysema   Lung infiltrate      PAST MEDICAL HISTORY: Past Medical History  Diagnosis Date  . Hypertension   . Hyperlipidemia   . Chronic headache   . Fibromyalgia     daily narcotics  . Anxiety     hx chronic BZ use, stopped 07/2010  . Anemia   . Pulmonary sarcoidosis     unimpressive CT chest 2011  . Colonic polyp   . GERD (gastroesophageal reflux disease)   . ALLERGIC RHINITIS   . Asthma   . CHF (congestive heart failure)     Diastolic with fluid overload, May, 2012, LVEF 60%  . Morbid obesity   . Depression   . Panic attacks   . Diabetes mellitus   . COPD (chronic obstructive pulmonary disease)     on home O2, moderate airflow obstruction, suspect d/t emphysema  . Obesity   . Elevated LFTs 09/2011  . Ovarian cyst   . Chronic back pain   . Sleep apnea      CPAP  . Fatty liver   . Esophagitis   . Gastritis   . Internal hemorrhoids   . Adenomatous colon polyp 12/03/09    [facility] in [place], [place]: Current Discharge Medication List    START taking these medications   Details  amoxicillin-clavulanate (AUGMENTIN)  875-125 MG per tablet Take 1 tablet by mouth 2 (two) times daily. Qty: 5 tablet, Refills: 0    predniSONE (DELTASONE) 10 MG tablet Take 3 tablets (30 mg) daily for 3 days, then, Take 2 tablets (20 mg) daily for 3 days, then, Take 1 tablets (30 mg) daily for 1 day, and then stop Qty: 16 tablet, Refills: 0      CONTINUE these medications which have CHANGED   Details  ALPRAZolam (XANAX) 1 MG tablet Take 0.5 tablets (0.5 mg total) by mouth 3 (three) times daily. Qty: 30 tablet, Refills: 0    insulin aspart (NOVOLOG FLEXPEN) 100 UNIT/ML FlexPen Inject 5 Units into the skin 3 (three) times daily with meals. Qty: 21 mL, Refills: 0    Insulin Glargine (LANTUS SOLOSTAR) 100 UNIT/ML Solostar Pen Inject 50 Units into the skin 2 (two) times daily. Qty: 15 mL, Refills: 0    tiZANidine (ZANAFLEX) 4 MG tablet Take 1 tablet (4 mg total) by mouth every 8 (eight) hours as needed for muscle spasms. Qty: 30 tablet, Refills: 0      CONTINUE these medications which have NOT CHANGED   Details  acetaminophen (TYLENOL) 325 MG tablet Take 1,300 mg by mouth every 6 (six) hours as needed for moderate pain.     albuterol (PROVENTIL) (2.5 MG/3ML) 0.083% nebulizer  solution Take 3 mLs (2.5 mg total) by nebulization every 6 (six) hours as needed for wheezing or shortness of breath. Qty: 75 mL, Refills: 2    aspirin EC 81 MG tablet Take 81 mg by mouth daily.    BIOTIN PO Take 1 capsule by mouth daily.    cetirizine (ZYRTEC) 1 MG/ML syrup TAKE 10 ML BY MOUTH EVERY NIGHT AT BEDTIME Qty: 300 mL, Refills: 0    Coenzyme Q10 (CO Q 10) 10 MG CAPS Take 1 capsule by mouth daily.    diclofenac sodium (VOLTAREN) 1 % GEL Apply 4 g topically 4 (four) times daily. Qty: 100 g, Refills: 0    esomeprazole (NEXIUM) 40 MG capsule Take 40 mg by mouth 2 (two) times daily before a meal.    fluticasone (FLONASE) 50 MCG/ACT nasal spray Place 2 sprays into both nostrils daily. Qty: 16 g, Refills: 5    gabapentin (NEURONTIN)  300 MG capsule Take 300 mg by mouth at bedtime.    glimepiride (AMARYL) 1 MG tablet Take 1 mg by mouth daily with breakfast.    Multiple Vitamin (MULTIVITAMIN) capsule Take 1 capsule by mouth daily.    omega-3 fish oil (MAXEPA) 1000 MG CAPS capsule Take 1 capsule by mouth daily.    Oxycodone HCl 10 MG TABS Take 1 tablet (10 mg total) by mouth every 6 (six) hours as needed. Qty: 10 tablet, Refills: 0    PROAIR HFA 108 (90 BASE) MCG/ACT inhaler INHALE 2 PUFFS BY MOUTH EVERY 6 HOURS AS NEEDED Qty: 8.5 g, Refills: 1    SPIRIVA HANDIHALER 18 MCG inhalation capsule INHALE CONTENTS OF 1 CAPSULE USING HANDIHALER EVERY DAY Qty: 30 capsule, Refills: 0    SYMBICORT 160-4.5 MCG/ACT inhaler INHALE 2 PUFFS BY MOUTH TWICE DAILY Qty: 10.2 g, Refills: 3    torsemide (DEMADEX) 100 MG tablet Take 1 tablet (100 mg total) by mouth 2 (two) times daily. Qty: 60 tablet, Refills: 1    varenicline (CHANTIX) 0.5 MG tablet Take 1 tablet (0.5 mg total) by mouth 2 (two) times daily. Qty: 60 tablet, Refills: 0    venlafaxine XR (EFFEXOR XR) 37.5 MG 24 hr capsule Take 1 capsule (37.5 mg total) by mouth daily with breakfast. Qty: 30 capsule, Refills: 3    hydrOXYzine (ATARAX/VISTARIL) 10 MG tablet TAKE 1 TABLET BY MOUTH THREE TIMES DAILY AS NEEDED FOR ITCHING Qty: 30 tablet, Refills: 11    ibuprofen (ADVIL,MOTRIN) 400 MG tablet Take 1 tablet (400 mg total) by mouth every 6 (six) hours as needed for moderate pain.      STOP taking these medications     guaiFENesin (MUCINEX) 600 MG 12 hr tablet      potassium chloride SA (K-DUR,KLOR-CON) 20 MEQ tablet         ALLERGIES:   Allergies  Allergen Reactions  . Simvastatin Other (See Comments)    Severe leg pain and burning  . Sulfamethoxazole-Trimethoprim Nausea And Vomiting    Causes Projectile Vomiting  . Zolpidem Tartrate Other (See Comments)    Chest pain  . Azithromycin Other (See Comments)    Resistant to med  . Ceftin [Cefuroxime] Swelling and  Other (See Comments)    MOUTH SWELLING  . Doxycycline Other (See Comments)    Resistant to med  . Latex Itching and Rash    Also burning sensations  . Metformin And Related Diarrhea    Severe diarrhea  . Metolazone Other (See Comments)    MYALGIAS  . Metronidazole Nausea And Vomiting  .  Ciprofloxacin Nausea Only  . Tramadol Itching and Nausea Only    BRIEF HPI:  See H&P, Labs, Consult and Test reports for all details in brief, patient is a 49 y.o. female with past medical history of asthma, COPD, diastolic congestive heart failure (EF 55-60% with grade 1 diastolic dysfunction), diabetes mellitus, GERD, depression, anxiety disorder, who presented with worsening shortness of breath.  CONSULTATIONS:   pulmonary/intensive care and GI  PERTINENT RADIOLOGIC STUDIES: Dg Chest 2 View  12/13/2014   CLINICAL DATA:  Weakness and shortness of Breath  EXAM: CHEST  2 VIEW  COMPARISON:  12/11/2014  FINDINGS: Cardiac shadow is mildly enlarged. Patchy changes are noted in the bases bilaterally stable from the prior exam. No new focal infiltrate is seen. No sizable effusion is noted.  IMPRESSION: Patchy bibasilar changes.  No acute abnormality is noted.   Electronically Signed   By: [name] M.D.   On: 12/13/2014 11:07   Dg Chest 2 View  12/11/2014   CLINICAL DATA:  Short of breath.  History pneumonia.  EXAM: CHEST  2 VIEW  COMPARISON:  12/09/2014  FINDINGS: Chronic lung disease with hyperinflation and bibasilar scarring. Prominent lung markings in the bases are stable with prior studies  Cardiac enlargement with pulmonary vascular congestion. Findings are consistent with mild fluid overload without edema or effusion. No definite pneumonia.  IMPRESSION: Chronic lung disease bibasilar scarring which appears stable  Cardiac enlargement with mild vascular congestion suggesting mild fluid overload.  No change from the prior study.   Electronically Signed   By: Franchot Gallo M.D.   On: 12/11/2014 10:36    Dg Chest 2 View (if Patient Has Fever And/or Copd)  12/09/2014   CLINICAL DATA:  Acute onset of severe shortness of breath. Initial encounter.  EXAM: CHEST  2 VIEW  COMPARISON:  Chest radiograph performed 10/31/2014  FINDINGS: The lungs are well-aerated. Vascular congestion is noted. Bilateral central airspace opacities raise concern for pulmonary edema, superimposed on chronic lung changes as previously noted. Mild pneumonia might have a similar appearance. There is no evidence of pleural effusion or pneumothorax.  The heart is borderline normal in size. No acute osseous abnormalities are seen.  IMPRESSION: Vascular congestion noted. Bilateral central airspace opacities raise concern for mild pulmonary edema, superimposed on chronic lung changes. Mild pneumonia might have a similar appearance.   Electronically Signed   By: Garald Balding M.D.   On: 12/09/2014 04:22   Ct Angio Chest Pe W/cm &/or Wo Cm  12/18/2014   CLINICAL DATA:  Shortness breath, chest pain. History of sarcoidosis, COPD.  EXAM: CT ANGIOGRAPHY CHEST WITH CONTRAST  TECHNIQUE: Multidetector CT imaging of the chest was performed using the standard protocol during bolus administration of intravenous contrast. Multiplanar CT image reconstructions and MIPs were obtained to evaluate the vascular anatomy.  CONTRAST:  75mL OMNIPAQUE IOHEXOL 350 MG/ML SOLN  COMPARISON:  08/04/2013  FINDINGS: No filling defects in the pulmonary arteries to suggest pulmonary emboli. The distal vessels are not well visualized due to respiratory motion. Mild cardiomegaly. Aorta is normal caliber. Pulmonary artery remains dilated measuring 36 mm compared to 38 mm previously. Central pulmonary arteries appear prominent. Findings suggest pulmonary arterial hypertension.  Mild emphysema/COPD. Diffuse ground-glass opacities throughout the lungs are stable since prior study. No pleural effusions. Chronic bibasilar densities likely reflect scarring. No pleural effusions.  Small  scattered borderline sized mediastinal and bilateral hilar lymph nodes, similar to prior study. No axillary adenopathy.  Chest wall soft tissues are unremarkable.  Imaging into the upper abdomen demonstrates diffuse fatty infiltration of the liver. No acute findings.  No acute bony abnormality or focal bone lesion.  Review of the MIP images confirms the above findings.  IMPRESSION: No evidence of pulmonary embolus. The distal peripheral vessels are not well visualized due to respiratory motion.  Borderline heart size. Stable dilatation of the pulmonary arteries compatible with pulmonary arterial hypertension.  Underline COPD. Stable diffuse ground-glass opacities throughout the lungs and bibasilar scarring.   Electronically Signed   By: Rolm Baptise M.D.   On: 12/18/2014 23:55   Ct Chest High Resolution  12/23/2014   CLINICAL DATA:  49 year old female with lung infiltrates. 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  EXAM: CT CHEST WITHOUT CONTRAST  TECHNIQUE: Multidetector CT imaging of the chest was performed following the standard protocol without intravenous contrast. High resolution imaging of the lungs, as well as inspiratory and expiratory imaging, was performed.  COMPARISON:  Chest CT 12/18/2014.  FINDINGS: Mediastinum/Lymph Nodes: Heart size is mildly enlarged. There is no significant pericardial fluid, thickening or pericardial calcification. There is atherosclerosis of the thoracic aorta, the great vessels of the mediastinum and the coronary arteries, including calcified atherosclerotic plaque in the left anterior descending coronary artery. Dilatation of the pulmonic trunk (4.4 cm in diameter), suggesting pulmonary arterial hypertension. Multiple borderline enlarged and minimally enlarged mediastinal and bilateral hilar lymph nodes, measuring up to 1 cm in short axis in the right paratracheal nodal station. Esophagus is unremarkable in appearance. No axillary lymphadenopathy.  Lungs/Pleura: The appearance of the  lungs is again very unusual, with areas of moderate to severe centrilobular and paraseptal emphysema scattered in a patchy distribution throughout the lungs, with areas of significant involvement, and other areas of near complete sparing. There continues to be extensive thickening of the peribronchovascular interstitium, which is mild in some places, and very severe in other places. The most severely thickened areas of peribronchovascular interstitium remain in the lung bases, where there is some associated mild cylindrical bronchiectasis, these findings have been present on prior studies dating back to 04/27/2010, albeit more severe on the more recent prior examinations than the remote prior studies. There are a few new nodular appearing areas of airspace consolidation in the lungs bilaterally compared to the most recent prior study from 12/18/2014, presumably infectious or inflammatory. These are within the right lower lobe (images 22 and 26 of series 4) and the left lower lobe (images 32 and 38 of series 4). High-resolution images redemonstrate the above findings, and otherwise demonstrate no significant regions of peripheral subpleural reticulation or frank honeycombing. Inspiratory and expiratory imaging is unremarkable. No pleural effusions.  Upper Abdomen: Diffuse low-attenuation in the hepatic parenchyma, compatible with hepatic steatosis. Two tiny locules of gas are noted within the liver in segment 4 on images 56 and 57 of series 3, favored to be pneumobilia.  Musculoskeletal/Soft Tissues: Old healed fractures of the lateral aspects of the right seventh ninth and tenth ribs are noted. There are no aggressive appearing lytic or blastic lesions noted in the visualized portions of the skeleton.  IMPRESSION: 1. 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 The pattern is somewhat unusual, and has slowly progressed when  compared to remote prior studies dating back to 04/27/2010. Today's examination does demonstrate four new areas of nodular airspace consolidation in the lower lobes of the lungs bilaterally (detailed above), which have developed in the past 3 days, presumably infectious or inflammatory in etiology.  2. Dilatation of the pulmonic trunk, suggestive of pulmonary arterial hypertension. 3. Mild cardiomegaly. 4. 2 tiny locules of gas in segment 4 of the liver, presumably pneumobilia. However, the liver is incompletely visualized. Clinical correlation is recommended. If patient has had prior sphincterotomy, no further workup or evaluation may be warranted. In the absence of history of prior sphincterotomy or other surgical intervention of the biliary tract, clinical correlation for signs and symptoms of ascending cholangitis would be recommended. 5. Hepatic steatosis.   Electronically Signed   By: [name] M.D.   On: 12/23/2014 08:46   Nm Pulmonary Perf And Vent  12/17/2014   CLINICAL DATA:  49 year old female with shortness of breath and chest discomfort. Initial encounter.  EXAM: NUCLEAR MEDICINE VENTILATION - PERFUSION LUNG SCAN  TECHNIQUE: Ventilation images were obtained in multiple projections using inhaled aerosol technetium 99 M DTPA. Perfusion images were obtained in multiple projections after intravenous injection of Tc-30m MAA.  RADIOPHARMACEUTICALS:  44.0 mCi Tc-2m DTPA aerosol and 5 point for mCi Tc-87m MAA  COMPARISON:  Portable chest radiograph 12/16/2014.  FINDINGS: Ventilation: Is heterogeneous bilaterally, in part due to central clumping of the ventilation agent. Scattered bilateral peripheral ventilation defects.  Perfusion: Scattered bilateral small and peripheral perfusion defects, but appear matched in both lungs.  IMPRESSION: Abnormal scan with matched bilateral pulmonary ventilation and perfusion defects that could reflect chronic obstructed pulmonary disease or chronic pulmonary embolus.   No scintigraphic evidence of acute PE.   Electronically Signed   By: Genevie Ann M.D.   On: 12/17/2014 13:05   Dg Chest Port 1 View  12/21/2014   CLINICAL DATA:  Respiratory failure  EXAM: PORTABLE CHEST - 1 VIEW  COMPARISON:  Chest x-ray and CT scan of the chest of December 18, 2014  FINDINGS: The lungs are well-expanded. The pulmonary interstitial markings remain increased but have improved slightly. There is subsegmental atelectasis at the right lung base that is stable. No significant pleural effusion is observed. The cardiac silhouette remains enlarged. The central pulmonary vascularity remains prominent but is less engorged today.  IMPRESSION: COPD. Mild improvement in interstitial edema secondary to CHF. There is persistent right basilar atelectasis or pneumonia.   Electronically Signed   By: David  Martinique   On: 12/21/2014 07:29   Dg Chest Portable 1 View  12/18/2014   CLINICAL DATA:  Shortness of Breath, acute  EXAM: PORTABLE CHEST - 1 VIEW  COMPARISON:  December 16, 2014  FINDINGS: There is interstitial edema superimposed on emphysematous change and bibasilar scarring. Heart is mildly enlarged with pulmonary venous hypertension. No adenopathy. No bone lesions.  IMPRESSION: Congestive heart failure superimposed on emphysematous change. No airspace consolidation.   Electronically Signed   By: Lowella Grip III M.D.   On: 12/18/2014 19:20   Dg Chest Port 1 View  12/16/2014   CLINICAL DATA:  Shortness of breath, respiratory failure, CHF and pulmonary infiltrates.  EXAM: PORTABLE CHEST - 1 VIEW  COMPARISON:  12/14/2014  FINDINGS: Bibasilar opacities, right greater than left are favored to largely represent chronic areas of scarring and atelectasis when reviewing prior chest x-rays. No progressive pulmonary airspace consolidation is identified. There is no evidence of overt pulmonary edema or pleural fluid. No pneumothorax is identified. The heart size is stable and normal.  IMPRESSION: Remaining bibasilar  opacities, right greater than left, are favored to represent chronic areas of scarring and atelectasis.   Electronically Signed   By: Aletta Edouard M.D.   On: 12/16/2014 11:38  Dg Chest Port 1 View  12/14/2014   CLINICAL DATA:  Shortness of breath and chest pain  EXAM: PORTABLE CHEST - 1 VIEW  COMPARISON:  12/13/2014  FINDINGS: Cardiac shadow remains enlarged. Patchy infiltrate is noted in the right lung base. Some diffuse interstitial changes are noted bilaterally which may be related to superimposed congestive failure. This is more prominent than that seen on the prior exam.  IMPRESSION: Stable patchy right basilar infiltrate.  Increasing vascular congestion and interstitial edema.   Electronically Signed   By: [name] M.D.   On: 12/14/2014 10:57   Mr Abd W/wo Cm/mrcp  12/26/2014   CLINICAL DATA:  Chronic right upper quadrant pain. Hepatic steatosis.  EXAM: MRI ABDOMEN WITHOUT AND WITH CONTRAST (INCLUDING MRCP)  TECHNIQUE: Multiplanar multisequence MR imaging of the abdomen was performed both before and after the administration of intravenous contrast. Heavily T2-weighted images of the biliary and pancreatic ducts were obtained, and three-dimensional MRCP images were rendered by post processing.  CONTRAST:  41mL MULTIHANCE GADOBENATE DIMEGLUMINE 529 MG/ML IV SOLN  COMPARISON:  Chest CT on 12/22/2014, and AP CT on 10/12/2011  FINDINGS: Exam is technically degraded by motion artifact and large patient habitus and claustrophobia.  Lower chest: Bibasilar pulmonary opacity as better visualized on recent chest CT.  Hepatobiliary: Diffuse hepatic steatosis is demonstrated. No focal liver masses are visualized.  Gallbladder is unremarkable. MRCP images are particularly degraded by artifact, however there is no definite evidence of biliary ductal dilatation.  Pancreas: No masses, inflammatory changes, or fluid collections demonstrated.  Spleen:  Within normal limits in size and appearance.  Adrenal Glands:  No  masses identified.  Kidneys: No renal masses identified. No evidence of hydronephrosis.  Stomach/Bowel/Peritoneum: No evidence of wall thickening, mass, or obstruction involving visualized abdominal bowel.  Vascular/Lymphatic: No pathologically enlarged lymph nodes identified. No other significant abnormality noted.  Other:  Diffuse abdominal wall subcutaneous edema noted.  Musculoskeletal:  No suspicious bone lesions identified.  IMPRESSION: Technically degraded exam due to motion artifact and patient habitus. No definite evidence of gallbladder disease, biliary dilatation, or other acute findings.  Diffuse hepatic steatosis.   Electronically Signed   By: Earle Gell M.D.   On: 12/26/2014 09:26   US Abdomen Limited Ruq  12/25/2014   CLINICAL DATA:  Pneumobilia  EXAM: US ABDOMEN LIMITED - RIGHT UPPER QUADRANT  COMPARISON:  None.  FINDINGS: Gallbladder:  No gallstones or wall thickening visualized. No sonographic Murphy sign noted.  Common bile duct:  Diameter: 4 mm. Where visualized, no filling defect or evidence of pneumobilia.  Liver:  Increased liver echogenicity with coarsened texture. No focal lesion is seen. There is antegrade flow in the imaged portal venous system.  IMPRESSION: 1. No explanation for liver findings on chest CT 12/21/2014. 2. Hepatic steatosis.   Electronically Signed   By: Monte Fantasia M.D.   On: 12/25/2014 16:40     PERTINENT LAB RESULTS: CBC:  Recent Labs  12/26/14 0636  WBC 9.4  HGB 12.8  HCT 40.8  PLT 188   CMET CMP     Component Value Date/Time   NA 141 12/28/2014 0617   K 4.0 12/28/2014 0617   CL 94* 12/28/2014 0617   CO2 35* 12/28/2014 0617   GLUCOSE 97 12/28/2014 0617   BUN 15 12/28/2014 0617   CREATININE 0.83 12/28/2014 0617   CALCIUM 8.6 12/28/2014 0617   PROT 6.3 12/28/2014 0617   ALBUMIN 2.9* 12/28/2014 0617   AST 38* 12/28/2014 0617  ALT 212* 12/28/2014 0617   ALKPHOS 141* 12/28/2014 0617   BILITOT 0.8 12/28/2014 0617   GFRNONAA 83*  12/28/2014 0617   GFRAA >90 12/28/2014 0617    GFR Estimated Creatinine Clearance: 129.2 mL/min (by C-G formula based on Cr of 0.83). No results for input(s): LIPASE, AMYLASE in the last 72 hours. No results for input(s): CKTOTAL, CKMB, CKMBINDEX, TROPONINI in the last 72 hours. Invalid input(s): POCBNP No results for input(s): DDIMER in the last 72 hours. No results for input(s): HGBA1C in the last 72 hours. No results for input(s): CHOL, HDL, LDLCALC, TRIG, CHOLHDL, LDLDIRECT in the last 72 hours. No results for input(s): TSH, T4TOTAL, T3FREE, THYROIDAB in the last 72 hours.  Invalid input(s): FREET3 No results for input(s): VITAMINB12, FOLATE, FERRITIN, TIBC, IRON, RETICCTPCT in the last 72 hours. Coags: No results for input(s): INR in the last 72 hours.  Invalid input(s): PT Microbiology: No results found for this or any previous visit (from the past 240 hour(s)).   BRIEF HOSPITAL COURSE:   Principal Problem:   Acute-on-chronic respiratory failure: Suspected to be multifactorial from COPD, obesity ventilation syndrome/OSA, chronic diastolic heart failure, severe pulmonary hypertension. Patient was admitted and provided supportive care with as needed oxygen, diuretics, steroids and bronchodilators. PCCM was also consulted. With these measures, patient improved and is significantly better than her initial presentation.She will continue with oxygen and CPAP daily at bedtime. Follow-up appointment with PCCM has been arranged-please see below. Weight loss has been encouraged.  Active Problems:   ?PULMONARY SARCOIDOSIS: Not sure if patient has possible interstitial lung disease/? Sarcoidosis. Patient will be discharged on a steroid trial, further repeat chest x-ray/CT chest will be deferred to her primary pulmonologist. Please note, follow-up appointment has already been arranged. Patient has been asked to keep this appointment.    Suspected aspiration pneumonia:Due to abusing narcotics  in the hospital, she was caught injecting crushed narcotic pills into her IV site on 12/22/2014, she subsequently vomited, on CT scan likely aspiration. She started on Unasyn, will be transitioned to Augmentin for a few more days on discharge    Cor pulmonale: Appears to have some mild lower extremity edema, she will be on usual dosing of Demadex. Please reassess at next visit.please continue to monitor electrolytes closely while on Demadex.    Type 2 diabetes: Lantus has been increased to 50 units, she will also continue on 5 units of NovoLog with meals. Please continue to optimize diabetic regimen in the outpatient setting.    Mild elevation of transaminases: Acute hepatitis panel was negative, GI was consulted. Suspect elevation in LFTs likely secondary to fatty liver, and probable congestive hepatitis from cor pulmonale. Continue to monitor closely in the outpatient setting    Thromboytopenia: Resolved. HIT was negative    Pneumobilia: Seen by gastrology during this hospital stay, this was seen on the CT of the chest. MRI was done which did not show any pneumobilia. No further recommendations from gastroenterology.    Chronic respiratory failure: Continue with oxygen at home-normally on 3-4 L at all times    Obesity hypoventilation syndrome: Continue CPAP daily at bedtime    History of COPD: Continue supplemental oxygen, bronchodilators.    Morbid obesity: Counseled regarding importance of weight loss    Tobacco abuse: Continue smoking cessation, continue Chantix.    GERD: Continue PPI   TODAY-DAY OF DISCHARGE:  Subjective:   Karl Knarr today has no headache,no chest abdominal pain,no new weakness tingling or numbness, feels much better wants to  go home today.   Objective:   Blood pressure 124/74, pulse 75, temperature 98.6 F (37 C), temperature source Oral, resp. rate 18, height 5\' 8"  (1.727 m), weight 148.4 kg (327 lb 2.6 oz), last menstrual period 10/16/2012, SpO2 94  %.  Intake/Output Summary (Last 24 hours) at 12/28/14 1214 Last data filed at 12/28/14 0921  Gross per 24 hour  Intake   2300 ml  Output   7200 ml  Net  -4900 ml   Filed Weights   12/25/14 0523 12/26/14 0502 12/28/14 0552  Weight: 144.4 kg (318 lb 5.5 oz) 145.3 kg (320 lb 5.3 oz) 148.4 kg (327 lb 2.6 oz)    Exam Awake Alert, Oriented *3, No new F.N deficits, Normal affect [place].AT,PERRAL Supple Neck,No JVD, No cervical lymphadenopathy appriciated.  Symmetrical Chest wall movement, Good air movement bilaterally, CTAB RRR,No Gallops,Rubs or new Murmurs, No Parasternal Heave +ve B.Sounds, Abd Soft, Non tender, No organomegaly appriciated, No rebound -guarding or rigidity. No Cyanosis, Clubbing or edema, No new Rash or bruise  DISCHARGE CONDITION: Stable  DISPOSITION: Home  DISCHARGE INSTRUCTIONS:    Activity:  As tolerated with Full fall precautions use walker/cane & assistance as needed  Diet recommendation: Diabetic Diet Heart Healthy diet  Discharge Instructions    Call MD for:  difficulty breathing, headache or visual disturbances    Complete by:  As directed      Diet - low sodium heart healthy    Complete by:  As directed      Increase activity slowly    Complete by:  As directed            Follow-up Information    Follow up with PARRETT,TAMMY, NP On 01/05/2015.   Specialty:  Nurse Practitioner   Why:  11:15am    Contact information:   [address] [address] [phone]       Follow up with Birdie Riddle, MD In 1 week.   Specialty:  Cardiology   Contact information:   [place] [address] [phone]        Total Time spent on discharge equals 45 minutes.  SignedOren Binet 12/28/2014 12:14 PM

## 2014-12-28 NOTE — Progress Notes (Signed)
Speech Language Pathology Treatment: Dysphagia  Patient Details Name: [name] MRN: [id number] DOB: January 13, 1966 Today's Date: 12/28/2014 Time: 3291-9166 SLP Time Calculation (min) (ACUTE ONLY): 10 min  Assessment / Plan / Recommendation Clinical Impression  Follow-up for dysphagia. Pt was directly observed with solid and thin liquid consistencies without any overt s/s of aspiration. Discussed esophageal precautions with pt; sit upright during meals; stay upright for at least 30 minutes following meal; small sips/ slow rate. Recommend pt continue regular/ thin liquid diet. Speech will sign off.    HPI HPI: 49 year old female admitted with acute on chronic respiratory failure-most likely related to decompensated diastolic hear failure, decompensated OHS/OSA. C/o dysphagia with pills. PMH of GERD, panic attacks, gastritis, esophagitis, obesity, CHF, allergic rhinitis.    Pertinent Vitals    SLP Plan  All goals met    Recommendations Diet recommendations: Thin liquid;Regular Liquids provided via: Cup;No straw Medication Administration: Whole meds with liquid Supervision: Patient able to self feed;Intermittent supervision to cue for compensatory strategies Compensations: Slow rate;Small sips/bites;Follow solids with liquid Postural Changes and/or Swallow Maneuvers: Seated upright 90 degrees;Upright 30-60 min after meal              Oral Care Recommendations: Oral care BID Follow up Recommendations: None Plan: All goals met    GO     Bermudez-Bosch, Jacari Iannello 12/28/2014, 9:55 AM

## 2014-12-28 NOTE — Progress Notes (Signed)
Pt. Received discharge instructions and prescriptions. Educated pt. On follow-up appointments. Stressed importance of monitoring CBGs and tapering off of prednisone. Gave back pt. Medications. Removed IV. No skin issues noted. All questions answered. No further needs noted at this time.

## 2014-12-28 NOTE — Progress Notes (Signed)
CARE MANAGEMENT NOTE 12/28/2014  Patient:  [name], [name]   Account Number:  [id number]  Date Initiated:  12/25/2014  Documentation initiated by:  [facility]  Subjective/Objective Assessment:   admitted with acute respiratory failure  lives alone, has aides 3hrs/day 7d/wk  has home O2 with [employer]     Action/Plan:   PT/OT evals- no follow up therapy recommended   Anticipated DC Date:  12/28/2014   Anticipated DC Plan:  [place]  CM consult      Choice offered to / List presented to:             Status of service:  Completed, signed off Medicare Important Message given?  YES (If response is "NO", the following Medicare IM given date fields will be blank) Date Medicare IM given:  12/25/2014 Medicare IM given by:  [facility] Date Additional Medicare IM given:  12/28/2014 Additional Medicare IM given by:  [facility]  Discharge Disposition:  HOME/SELF CARE  Per UR Regulation:  Reviewed for med. necessity/level of care/duration of stay   Comments:  12/25/14 Spoke with patient about discharge plans.Patient lives alone but has an aide 3hrs/day 7days per week and family in [place] to assist prn. [name] stated that she has a rolling walker at home but does not use it often and has home O2 thru [place]. PT and OT did not recommended home therapy and no equipment needs were identified. Will contine to follow for d/c needs.

## 2014-12-29 DIAGNOSIS — J9620 Acute and chronic respiratory failure, unspecified whether with hypoxia or hypercapnia: Secondary | ICD-10-CM | POA: Diagnosis not present

## 2014-12-30 DIAGNOSIS — J9620 Acute and chronic respiratory failure, unspecified whether with hypoxia or hypercapnia: Secondary | ICD-10-CM | POA: Diagnosis not present

## 2014-12-31 ENCOUNTER — Telehealth: Payer: Self-pay | Admitting: Internal Medicine

## 2014-12-31 ENCOUNTER — Encounter: Payer: Self-pay | Admitting: Internal Medicine

## 2014-12-31 DIAGNOSIS — J9620 Acute and chronic respiratory failure, unspecified whether with hypoxia or hypercapnia: Secondary | ICD-10-CM | POA: Diagnosis not present

## 2014-12-31 DIAGNOSIS — M542 Cervicalgia: Secondary | ICD-10-CM | POA: Diagnosis not present

## 2014-12-31 NOTE — Telephone Encounter (Signed)
LMOVM to call back need to know details of how we can help, also if pt just release from hospital she most likely will need a hospital follow up with her PCP if she is have medical issues since hospital discharge,

## 2014-12-31 NOTE — Telephone Encounter (Signed)
Please call patient. She was released from the hospital on Monday and she wanted to talk with you because it looks like the hospital changed her PCP and it she is so worried about everything that is going on with her.

## 2014-12-31 NOTE — Telephone Encounter (Signed)
Spoke to pt: 1. Pt stated she has thrush mouth 2. Pt also stated sores in my nose is there a cream you can give me for both 3. Pt also needs to speak with you about this pain clinic you have me in. He does not want me to take Xanax.   There is a pt email also. Phone call and e-mail stated same thing.

## 2015-01-01 ENCOUNTER — Encounter: Payer: Self-pay | Admitting: Physician Assistant

## 2015-01-01 DIAGNOSIS — J9620 Acute and chronic respiratory failure, unspecified whether with hypoxia or hypercapnia: Secondary | ICD-10-CM | POA: Diagnosis not present

## 2015-01-02 DIAGNOSIS — J9620 Acute and chronic respiratory failure, unspecified whether with hypoxia or hypercapnia: Secondary | ICD-10-CM | POA: Diagnosis not present

## 2015-01-03 DIAGNOSIS — J9620 Acute and chronic respiratory failure, unspecified whether with hypoxia or hypercapnia: Secondary | ICD-10-CM | POA: Diagnosis not present

## 2015-01-04 ENCOUNTER — Ambulatory Visit: Payer: Medicare Other | Admitting: Neurology

## 2015-01-04 ENCOUNTER — Other Ambulatory Visit: Payer: Self-pay | Admitting: Internal Medicine

## 2015-01-04 DIAGNOSIS — J9620 Acute and chronic respiratory failure, unspecified whether with hypoxia or hypercapnia: Secondary | ICD-10-CM | POA: Diagnosis not present

## 2015-01-04 MED ORDER — NYSTATIN NICU ORAL SYRINGE 100,000 UNITS/ML: 1.0000 mL | Freq: Three times a day (TID) | OROMUCOSAL | Status: DC

## 2015-01-04 NOTE — Telephone Encounter (Signed)
Nystatin soln - 10cc TID rpn - swish and swallow - ok to call in i do not have cream for nose sores - use neosporin or vasoline as needed (OTC) Will need detailed message from pain clinic provider on the concern with xanax - is there alt benzo rx that would be acceptable? Or does the pain doc not want to be responsible for all controlled substances (narc and BZs)? i need clarification before I can address thanks

## 2015-01-04 NOTE — Telephone Encounter (Signed)
Pt contacted and informed of MD response.    Dr. Delena Serve at [facility] to be contacted regarding the reason and other clarification for PCP to respond to pt request.

## 2015-01-05 ENCOUNTER — Ambulatory Visit (INDEPENDENT_AMBULATORY_CARE_PROVIDER_SITE_OTHER): Payer: Medicare Other | Admitting: Adult Health

## 2015-01-05 ENCOUNTER — Encounter: Payer: Self-pay | Admitting: Adult Health

## 2015-01-05 VITALS — BP 128/66 | HR 87 | Temp 97.6°F

## 2015-01-05 DIAGNOSIS — I272 Pulmonary hypertension, unspecified: Secondary | ICD-10-CM

## 2015-01-05 DIAGNOSIS — I5033 Acute on chronic diastolic (congestive) heart failure: Secondary | ICD-10-CM | POA: Diagnosis not present

## 2015-01-05 DIAGNOSIS — J439 Emphysema, unspecified: Secondary | ICD-10-CM | POA: Diagnosis not present

## 2015-01-05 DIAGNOSIS — G4733 Obstructive sleep apnea (adult) (pediatric): Secondary | ICD-10-CM

## 2015-01-05 DIAGNOSIS — I270 Primary pulmonary hypertension: Secondary | ICD-10-CM

## 2015-01-05 DIAGNOSIS — D869 Sarcoidosis, unspecified: Secondary | ICD-10-CM

## 2015-01-05 DIAGNOSIS — J9620 Acute and chronic respiratory failure, unspecified whether with hypoxia or hypercapnia: Secondary | ICD-10-CM | POA: Diagnosis not present

## 2015-01-05 DIAGNOSIS — J189 Pneumonia, unspecified organism: Secondary | ICD-10-CM

## 2015-01-05 NOTE — Assessment & Plan Note (Signed)
Recent decompensated diastolic heart failure during recent hospitalization appears to be much improved with decreased and almost totally resolved. Lower extremity edema  continue on her current regimen. Advised on a low sodium diet

## 2015-01-05 NOTE — Patient Instructions (Signed)
Mucinex DM Twice daily As needed  Cough/congestion  Limit excessive fluid intake  Continue on BIPAP At bedtime   Oxygen 3l/m rest and 4 l/m with walking  Continue on low salt diet  Keep legs elevated.  Please contact office for sooner follow up if symptoms do not improve or worsen or seek emergency care  Follow up Dr. [name] in 4-6  weeks and As needed

## 2015-01-05 NOTE — Assessment & Plan Note (Signed)
Continue on BIPAP at bedtime

## 2015-01-05 NOTE — Assessment & Plan Note (Signed)
Severe COPD and active smoker. She is encouraged on smoking cessation This is complicated by underlying diastolic dysfunction. Patient is to continue on her current regimen Follow with Dr. Normajean Baxter in 4 weeks.

## 2015-01-05 NOTE — Progress Notes (Signed)
Subjective:    [name], female    DOB: Apr 08, 1966, 49 y.o.   MRN: [id number]  HPI 49 yo female smoker with known hx of COPD and obesity hypoventilation syndrome with chronic respiratory failure, as well as sleep apnea.  Known  diastolic dysfunction with a tendency toward fluid overload.  Polysubstance abuse with cocaine use.    01/05/2015 Follow up : COPD, OSA/OHS , Chronic Resp Failure on O2. , Severe PAH on echo  Patient returns for a one-week post hospital stay. She has recently been had a, but K hospital stay. She was initially admitted March 16 to March 25 and then was not able to stay at home and was readmitted same day on the 25th through April 4. She had difficulty with acute on chronic hypercarbic and hypoxic respiratory failure felt to be secondary to COPD exacerbation, decompensated diastolic heart failure. She was treated with aggressive IV antibiotics, steroids, nebulized bronchodilators, oxygen, nocturnal  BIPAP ,  diuresis. She has been found to have severe secondary pulmonary artery hypertension with elevated PA peas at 106 by echo on March 27.Marland Kitchen She has a known history of polysubstance abuse with previous cocaine positive on drug screen. Unfortunately, during this admission. Patient was found attempting to inject crushed narcotics into her IV site. There was question whether patient would be a good candidate for advanced therapy for her pulmonary artery hypertension. Patient does continue to smoke and has been advised on smoking cessation. Her LFTs were elevated and she was seen by gastroenterology. Felt to have a probable congestive hepatitis from cor pulmonale. Hepatitis panel was negative. Since discharge pt reports breathing is improved but still having some DOE and prod cough with thick yellow mucus.  denies any wheezing, tightness, f/c/s, n/v/d, hemoptysis. Swelling has been much better.    Review of Systems Constitutional:   No  weight loss, night  sweats,  Fevers, chills, +fatigue, or  lassitude.  HEENT:   No headaches,  Difficulty swallowing,  Tooth/dental problems, or  Sore throat,                No sneezing, itching, ear ache,  +nasal congestion, post nasal drip,   CV:  No chest pain,  Orthopnea, PND,  , anasarca, dizziness, palpitations, syncope.   GI  No heartburn, indigestion, abdominal pain, nausea, vomiting, diarrhea, change in bowel habits, loss of appetite, bloody stools.   Resp:  .  No chest wall deformity  Skin: no rash or lesions.  GU: no dysuria, change in color of urine, no urgency or frequency.  No flank pain, no hematuria   MS:  No joint pain or swelling.  No decreased range of motion.  No back pain.  Psych:  No change in mood or affect. No depression or anxiety.  No memory loss.         Objective:   Physical Exam GEN: A/Ox3; pleasant , NAD, obese, on O2   HEENT:  [place]/AT,  EACs-clear, TMs-wnl, NOSE-clear drainage  THROAT-clear, no lesions, no postnasal drip or exudate noted.   NECK:  Supple w/ fair ROM; no JVD; normal carotid impulses w/o bruits; no thyromegaly or nodules palpated; no lymphadenopathy.  RESP  Decreased BS , no wheezing  .no accessory muscle use, no dullness to percussion  CARD:  RRR, no m/r/g  , no peripheral edema, pulses intact, no cyanosis or clubbing.  GI:   Soft & nt; nml bowel sounds; no organomegaly or masses detected.  Musco: Warm bil, no  deformities or joint swelling noted.   Neuro: alert, no focal deficits noted.    Skin: Warm, no lesions or rashes         Assessment & Plan:

## 2015-01-05 NOTE — Assessment & Plan Note (Signed)
Pulmonary HTN worsening on echo this admit  Of note VQ did show vent/perfusion defects ? CTEPH  Concern that pt is not good candidate for advanced therapy with substance abuse /ongoing smoking  For now cont on O2 and diuresis to avoid cor pulmonale decompensation .

## 2015-01-05 NOTE — Addendum Note (Signed)
Addended by: Parke Poisson E on: 01/05/2015 05:37 PM   Modules accepted: Orders

## 2015-01-05 NOTE — Assessment & Plan Note (Signed)
Diagnosed with FOB in 1997 per pt.  Path report not available. CT chest 2011:  No signif LN, no ISLD, tiny scattered nodules CT chest 2013:  No LN, mild scarring in bases, no definite ISLD  Ct CHEST during admission showed some progression w/ ? Acute process in lower lobes.  She was treated with abx  Will repeat cxr today .  Advised on smoking cessation

## 2015-01-06 DIAGNOSIS — J9620 Acute and chronic respiratory failure, unspecified whether with hypoxia or hypercapnia: Secondary | ICD-10-CM | POA: Diagnosis not present

## 2015-01-06 MED ORDER — CETIRIZINE HCL 1 MG/ML PO SYRP: 10.0000 mg | ORAL_SOLUTION | Freq: Every day | ORAL | Status: DC

## 2015-01-07 DIAGNOSIS — J9620 Acute and chronic respiratory failure, unspecified whether with hypoxia or hypercapnia: Secondary | ICD-10-CM | POA: Diagnosis not present

## 2015-01-08 DIAGNOSIS — J9620 Acute and chronic respiratory failure, unspecified whether with hypoxia or hypercapnia: Secondary | ICD-10-CM | POA: Diagnosis not present

## 2015-01-08 NOTE — Telephone Encounter (Signed)
Called pain clinic. Requested explanation and OV notes for PCP review.

## 2015-01-09 DIAGNOSIS — J9620 Acute and chronic respiratory failure, unspecified whether with hypoxia or hypercapnia: Secondary | ICD-10-CM | POA: Diagnosis not present

## 2015-01-10 DIAGNOSIS — J9620 Acute and chronic respiratory failure, unspecified whether with hypoxia or hypercapnia: Secondary | ICD-10-CM | POA: Diagnosis not present

## 2015-01-11 ENCOUNTER — Encounter: Payer: Self-pay | Admitting: Physician Assistant

## 2015-01-11 ENCOUNTER — Ambulatory Visit (INDEPENDENT_AMBULATORY_CARE_PROVIDER_SITE_OTHER): Payer: Medicare Other | Admitting: Physician Assistant

## 2015-01-11 ENCOUNTER — Other Ambulatory Visit: Payer: Self-pay | Admitting: Internal Medicine

## 2015-01-11 VITALS — BP 110/74 | HR 101 | Ht 68.0 in | Wt 298.8 lb

## 2015-01-11 DIAGNOSIS — R000 Tachycardia, unspecified: Secondary | ICD-10-CM

## 2015-01-11 DIAGNOSIS — D869 Sarcoidosis, unspecified: Secondary | ICD-10-CM

## 2015-01-11 DIAGNOSIS — G4733 Obstructive sleep apnea (adult) (pediatric): Secondary | ICD-10-CM

## 2015-01-11 DIAGNOSIS — I509 Heart failure, unspecified: Secondary | ICD-10-CM | POA: Diagnosis not present

## 2015-01-11 DIAGNOSIS — I5032 Chronic diastolic (congestive) heart failure: Secondary | ICD-10-CM

## 2015-01-11 DIAGNOSIS — I270 Primary pulmonary hypertension: Secondary | ICD-10-CM | POA: Diagnosis not present

## 2015-01-11 DIAGNOSIS — J9620 Acute and chronic respiratory failure, unspecified whether with hypoxia or hypercapnia: Secondary | ICD-10-CM | POA: Diagnosis not present

## 2015-01-11 DIAGNOSIS — J439 Emphysema, unspecified: Secondary | ICD-10-CM

## 2015-01-11 DIAGNOSIS — I10 Essential (primary) hypertension: Secondary | ICD-10-CM | POA: Diagnosis not present

## 2015-01-11 DIAGNOSIS — I50810 Right heart failure, unspecified: Secondary | ICD-10-CM | POA: Insufficient documentation

## 2015-01-11 DIAGNOSIS — I272 Pulmonary hypertension, unspecified: Secondary | ICD-10-CM

## 2015-01-11 DIAGNOSIS — I5033 Acute on chronic diastolic (congestive) heart failure: Secondary | ICD-10-CM

## 2015-01-11 LAB — BASIC METABOLIC PANEL
BUN: 11 mg/dL (ref 6–23)
CALCIUM: 8.9 mg/dL (ref 8.4–10.5)
CO2: 34 mEq/L — ABNORMAL HIGH (ref 19–32)
CREATININE: 0.88 mg/dL (ref 0.40–1.20)
Chloride: 91 mEq/L — ABNORMAL LOW (ref 96–112)
GFR: 88.23 mL/min (ref >60.00)
GLUCOSE: 257 mg/dL — AB (ref 70–99)
Potassium: 3.2 mEq/L — ABNORMAL LOW (ref 3.5–5.1)
Sodium: 134 mEq/L — ABNORMAL LOW (ref 135–145)

## 2015-01-11 MED ORDER — METOLAZONE 2.5 MG PO TABS: 2.5000 mg | ORAL_TABLET | Freq: Once | ORAL | Status: DC

## 2015-01-11 MED ORDER — POTASSIUM CHLORIDE CRYS ER 20 MEQ PO TBCR
40.0000 meq | EXTENDED_RELEASE_TABLET | Freq: Two times a day (BID) | ORAL | Status: DC
Start: 2015-01-11 — End: 2015-01-12

## 2015-01-11 NOTE — Patient Instructions (Signed)
Medication Instructions:  1. TAKE 1 TIME DOSE OF METOLAZONE 2.5 MG 30 MINUTES BEFORE YOUR TORSEMIDE TONIGHT; YOU WILL ALSO NEED TO TAKE AN EXTRA POTASSIUM TONIGHT AS WELL ALONG WITH YOUR REGULAR DOSE OF POTASSIUM  Labwork: 1. TODAY; BMET 2. REPEAT BMET WED 01/13/15 WHEN YOU SEE PRIMARY CARE  Testing/Procedures: NONE  Follow-Up: 1-2 WEEKS WITH DR. KATZ; LOOK AT DAYS 4/25 AND 4/29 FOR APPTS  Any Other Special Instructions Will Be Listed Below (If Applicable).

## 2015-01-11 NOTE — Progress Notes (Signed)
Cardiology Office Note   Date:  01/11/2015   ID:  [name], DOB October 13, 1965, MRN [id number]  PCP:  Gwendolyn Grant, MD  Cardiologist:  Dr. Cleatis Polka     Chief Complaint  Patient presents with  . Congestive Heart Failure     History of Present Illness: [name] is a 49 y.o. female with a hx of chronic respiratory failure in the setting of COPD, OSA, OHS, pulmonary sarcoidosis.  She has severe pulmonary hypertension, diastolic HF, right-sided HF, HTN, HL.  Last seen by Dr. Cleatis Polka in 2014.    Admitted 3/25-4/4 with acute on chronic respiratory failure. This was suspected to be multifactorial from COPD, obesity hypoventilation syndrome, sleep apnea, chronic diastolic HF, severe pulmonary hypertension. She was treated with a combination of oxygen, diuretics, steroids and bronchodilators. Workup of suspected pulmonary sarcoidosis could be pursued as an outpatient with her pulmonologist. Of note, during her hospitalization she did develop aspiration pneumonia. She was caught injecting crushed narcotic pills into her IV site. She vomited and findings on CT scan were likely related to aspiration. She was treated with antibiotics.  DC summary indicates that her home dose of Torsemide was not adjusted.    Seen in FU with T. Parrett, NP 01/05/15. There was note made of VQ scan that demonstrated V/Q defects - ? CTEPH.  She is not on anticoagulation.    She returns for follow-up. She had been feeling well up until the last couple of days. She tells me that her weight has increased 3.6 pounds since yesterday. She has increased fatigue and increased dyspnea. She describes NYHA 3-3b symptoms. She sleeps on 4 pillows. She sleeps with CPAP. She feels that her lower extremities are more edematous today. She has occasional chest pain. She has noted this over the years. It typically occurs when she has increased volume. She denies exertional symptoms. It does feel like indigestion and  proton pump inhibitor therapy generally helps. She denies syncope. She does describe increased abdominal girth and early satiety.   Studies/Reports Reviewed Today:  Echo 12/21/14 - Moderate LVH. Systolic function was vigorous. EF 65% to 70%. Grade 1 diastolic dysfunction - Mitral valve: There was mild regurgitation. - Right ventricle: The cavity size was moderately dilated. Systolic function was severely reduced. - Right atrium: The atrium was mildly dilated. - Atrial septum: The septum bowed from right to left, consistent with increased right atrial pressure. - PA peak pressure: 106 mm Hg (S).  Impressions:  Normal LV function; grade 1 diastolic dysfunction; RAE/RVE; severely reduced RV function; severely elevated pulmonary pressure.   Past Medical History  Diagnosis Date  . Hypertension   . Hyperlipidemia   . Chronic headache   . Fibromyalgia     daily narcotics  . Anxiety     hx chronic BZ use, stopped 07/2010  . Anemia   . Pulmonary sarcoidosis     unimpressive CT chest 2011  . Colonic polyp   . GERD (gastroesophageal reflux disease)   . ALLERGIC RHINITIS   . Asthma   . CHF (congestive heart failure)     Diastolic with fluid overload, May, 2012, LVEF 60%  . Morbid obesity   . Depression   . Panic attacks   . Diabetes mellitus   . COPD (chronic obstructive pulmonary disease)     on home O2, moderate airflow obstruction, suspect d/t emphysema  . Obesity   . Elevated LFTs 09/2011  . Ovarian cyst   . Chronic back pain   .  Sleep apnea      CPAP  . Fatty liver   . Esophagitis   . Gastritis   . Internal hemorrhoids   . Adenomatous colon polyp 12/03/09    [facility] in [place], [place]    Past Surgical History  Procedure Laterality Date  . Polypectomy  2011  . Lumbar microdiscectomy  07/06/2011    R L4-5, stern  . Back surgery    . Carpal tunnel release    . Steroid spinal injections    . Hysteroscopy w/d&c N/A 03/25/2013    Procedure: DILATATION AND CURETTAGE  /HYSTEROSCOPY;  Surgeon: [name], MD;  Location: [facility];  Service: Gynecology;  Laterality: N/A;  . Dilation and curettage of uterus       Current Outpatient Prescriptions  Medication Sig Dispense Refill  . acetaminophen (TYLENOL) 325 MG tablet Take 1,300 mg by mouth every 6 (six) hours as needed for moderate pain.     Marland Kitchen albuterol (PROVENTIL) (2.5 MG/3ML) 0.083% nebulizer solution Take 3 mLs (2.5 mg total) by nebulization every 6 (six) hours as needed for wheezing or shortness of breath. 75 mL 2  . ALPRAZolam (XANAX) 1 MG tablet Take 0.5 tablets (0.5 mg total) by mouth 3 (three) times daily. 30 tablet 0  . aspirin EC 81 MG tablet Take 81 mg by mouth daily.    Marland Kitchen BIOTIN PO Take 1 capsule by mouth daily.    . cetirizine (ZYRTEC) 1 MG/ML syrup Take 10 mLs (10 mg total) by mouth at bedtime. 300 mL 0  . Coenzyme Q10 (CO Q 10) 10 MG CAPS Take 1 capsule by mouth daily.    . diclofenac sodium (VOLTAREN) 1 % GEL Apply 4 g topically 4 (four) times daily. 100 g 0  . esomeprazole (NEXIUM) 40 MG capsule Take 40 mg by mouth 2 (two) times daily before a meal.    . fluticasone (FLONASE) 50 MCG/ACT nasal spray Place 2 sprays into both nostrils daily. 16 g 5  . gabapentin (NEURONTIN) 300 MG capsule Take 300 mg by mouth at bedtime.    Marland Kitchen glimepiride (AMARYL) 1 MG tablet Take 1 mg by mouth daily with breakfast.    . hydrOXYzine (ATARAX/VISTARIL) 10 MG tablet TAKE 1 TABLET BY MOUTH THREE TIMES DAILY AS NEEDED FOR ITCHING 30 tablet 11  . ibuprofen (ADVIL,MOTRIN) 400 MG tablet Take 1 tablet (400 mg total) by mouth every 6 (six) hours as needed for moderate pain.    Marland Kitchen insulin aspart (NOVOLOG FLEXPEN) 100 UNIT/ML FlexPen Inject 5 Units into the skin 3 (three) times daily with meals. 21 mL 0  . Insulin Glargine (LANTUS SOLOSTAR) 100 UNIT/ML Solostar Pen Inject 50 Units into the skin 2 (two) times daily. 15 mL 0  . Multiple Vitamin (MULTIVITAMIN) capsule Take 1 capsule by mouth daily.    Marland Kitchen nystatin (MYCOSTATIN)  100000 UNITS/ML SUSP Take 1 mL by mouth 3 (three) times daily. Swish solution then swallow. 120 mL 0  . omega-3 fish oil (MAXEPA) 1000 MG CAPS capsule Take 1 capsule by mouth daily.    Marland Kitchen oxyCODONE-acetaminophen (PERCOCET/ROXICET) 5-325 MG per tablet Take 1 tablet by mouth every 12 (twelve) hours as needed.  0  . PROAIR HFA 108 (90 BASE) MCG/ACT inhaler INHALE 2 PUFFS BY MOUTH EVERY 6 HOURS AS NEEDED 8.5 g 1  . promethazine (PHENERGAN) 25 MG tablet Take 1 tablet (25 mg total) by mouth every 6 (six) hours as needed for nausea or vomiting. for nausea 30 tablet 0  . SPIRIVA HANDIHALER  18 MCG inhalation capsule INHALE CONTENTS OF 1 CAPSULE USING HANDIHALER EVERY DAY 30 capsule 0  . SYMBICORT 160-4.5 MCG/ACT inhaler INHALE 2 PUFFS BY MOUTH TWICE DAILY 10.2 g 3  . tiZANidine (ZANAFLEX) 4 MG tablet Take 1 tablet (4 mg total) by mouth every 8 (eight) hours as needed for muscle spasms. 30 tablet 0  . torsemide (DEMADEX) 100 MG tablet Take 1 tablet (100 mg total) by mouth 2 (two) times daily. 60 tablet 1  . varenicline (CHANTIX) 0.5 MG tablet Take 1 tablet (0.5 mg total) by mouth 2 (two) times daily. 60 tablet 0  . venlafaxine XR (EFFEXOR XR) 37.5 MG 24 hr capsule Take 1 capsule (37.5 mg total) by mouth daily with breakfast. 30 capsule 3   No current facility-administered medications for this visit.    Allergies:   Simvastatin; Sulfamethoxazole-trimethoprim; Zolpidem tartrate; Azithromycin; Ceftin; Doxycycline; Latex; Metformin and related; Metolazone; Metronidazole; Ciprofloxacin; and Tramadol    Social History:  The patient  reports that she quit smoking about 2 months ago. Her smoking use included Cigarettes. She has a 7.25 pack-year smoking history. She has never used smokeless tobacco. She reports that she drinks alcohol. She reports that she uses illicit drugs (Cocaine and Marijuana).   Family History:  The patient's family history includes Allergies in her brother; Congestive Heart Failure in her  brother; Diabetes type II in her brother and sister; Emphysema in her father; Heart attack in her sister; Heart disease in her father, mother, and sister; Heart failure in her sister; Hypertension in her father, mother, sister, and sister; Stomach cancer in her brother and father; Stroke in her sister.    ROS:   Please see the history of present illness.   Review of Systems  Constitution: Positive for decreased appetite, malaise/fatigue and weight gain.  HENT: Positive for headaches and hearing loss.   Eyes: Positive for visual disturbance.  Cardiovascular: Positive for chest pain, dyspnea on exertion, irregular heartbeat and leg swelling.  Respiratory: Positive for cough.   Skin: Positive for rash.  Musculoskeletal: Positive for back pain.  Gastrointestinal: Positive for nausea and vomiting.  Neurological: Positive for loss of balance.  Psychiatric/Behavioral: Positive for depression. The patient is nervous/anxious.   All other systems reviewed and are negative.    PHYSICAL EXAM: VS:  BP 110/74 mmHg  Pulse 101  Ht 5\' 8"  (1.727 m)  Wt 298 lb 12.8 oz (135.535 kg)  BMI 45.44 kg/m2  LMP 10/16/2012    Wt Readings from Last 3 Encounters:  01/11/15 298 lb 12.8 oz (135.535 kg)  12/28/14 327 lb 2.6 oz (148.4 kg)  12/06/14 296 lb (134.265 kg)     GEN: Well nourished, well developed, in no acute distress HEENT: normal Neck: I cannot assess JVD, no masses Cardiac:  Normal S1/S2, RRR; no murmur ,  no rubs or gallops, trace edema  Respiratory:  clear to auscultation bilaterally, no wheezing, rhonchi or rales. GI: soft, nontender, nondistended, + BS MS: no deformity or atrophy Skin: warm and dry  Neuro:  CNs II-XII intact, Strength and sensation are intact Psych: Normal affect   EKG:  EKG is ordered today.  It demonstrates:   Sinus tachy with multifocal p waves (likely MAT), HR 101   Recent Labs: 07/20/2014: Pro B Natriuretic peptide (BNP) 39.7 07/21/2014: TSH 0.508 10/21/2014:  Magnesium 2.2 12/18/2014: B Natriuretic Peptide 231.0* 12/26/2014: Hemoglobin 12.8; Platelets 188 12/28/2014: ALT 212*; BUN 15; Creatinine 0.83; Potassium 4.0; Sodium 141    Lipid Panel  Component Value Date/Time   CHOL 262* 04/06/2014 1144   TRIG 229.0* 04/06/2014 1144   TRIG 89 03/06/2010   HDL 38.00* 04/06/2014 1144   CHOLHDL 7 04/06/2014 1144   VLDL 45.8* 04/06/2014 1144   LDLCALC 178* 04/06/2014 1144   LDLDIRECT 204.3 09/03/2013 1648      ASSESSMENT AND PLAN:  Acute on chronic diastolic CHF (congestive heart failure) She has some evidence of volume overload. She feels worse today. I was going to place her on metolazone for several days. However, she tells me she's had worsening renal function in the past with this. Ultimately I decided to place her on metolazone 2.5 mg 1 dose. She will take that today 30 minutes prior to her torsemide. She will take an extra potassium as well. She knows to contact us tomorrow if her weight has not improved. Check basic metabolic panel today. Repeat later this week when she sees her primary care provider.    Right-sided heart failure Secondary to pulmonary hypertension in the setting of chronic lung disease. Adjust diuretics as noted.  Pulmonary HTN As noted, she had an abnormal VQ scan the hospital that was suggestive of possible chronic pulmonary emboli. She appears to be a poor candidate for anticoagulation therapy. I will review this again with Dr. Ron Parker as well as Dr. [name].   Essential hypertension Controlled.   Tachycardia  This appears to be multifocal atrial tachycardia which is likely related to her lung disease.  PULMONARY SARCOIDOSIS FU with pulmonology as planned.   Obstructive sleep apnea Continue CPAP  Pulmonary emphysema, unspecified emphysema type  FU with Pulmonology as planned.   Current medicines are reviewed at length with the patient today.  The patient does not have concerns regarding medicines.  The following  changes have been made:  As above   Labs/ tests ordered today include:   Orders Placed This Encounter  Procedures  . Basic Metabolic Panel (BMET)  . Basic Metabolic Panel (BMET)  . EKG 12-Lead    Disposition:   FU with Dr. Cleatis Polka or me 2 weeks.    Signed, Versie Starks, MHS 01/11/2015 3:12 PM    [facility] [address] [place], [place], [place]  [address] Phone: [phone]; Fax: [phone]

## 2015-01-12 ENCOUNTER — Telehealth: Payer: Self-pay | Admitting: Internal Medicine

## 2015-01-12 ENCOUNTER — Other Ambulatory Visit: Payer: Self-pay | Admitting: Physician Assistant

## 2015-01-12 ENCOUNTER — Other Ambulatory Visit: Payer: Self-pay | Admitting: *Deleted

## 2015-01-12 ENCOUNTER — Telehealth: Payer: Self-pay | Admitting: *Deleted

## 2015-01-12 ENCOUNTER — Other Ambulatory Visit: Payer: Self-pay | Admitting: Internal Medicine

## 2015-01-12 DIAGNOSIS — I5032 Chronic diastolic (congestive) heart failure: Secondary | ICD-10-CM

## 2015-01-12 DIAGNOSIS — J9620 Acute and chronic respiratory failure, unspecified whether with hypoxia or hypercapnia: Secondary | ICD-10-CM | POA: Diagnosis not present

## 2015-01-12 DIAGNOSIS — I5033 Acute on chronic diastolic (congestive) heart failure: Secondary | ICD-10-CM

## 2015-01-12 MED ORDER — POTASSIUM CHLORIDE CRYS ER 20 MEQ PO TBCR: 60.0000 meq | EXTENDED_RELEASE_TABLET | Freq: Two times a day (BID) | ORAL | Status: DC

## 2015-01-12 NOTE — Telephone Encounter (Signed)
lmptcb on both pt's home and cell # to go over lab results and med changes.

## 2015-01-12 NOTE — Telephone Encounter (Signed)
pt notified about lab results and to increase K+ to 60 meq BID. Pt will take extra dose of K+ today - take 40 mEq extra today. Bmet 4/20 w/PCP again 4/29 at Dr. Ron Parker appt. Pt verbalized understanding to plan of care.

## 2015-01-13 ENCOUNTER — Other Ambulatory Visit: Payer: Medicare Other

## 2015-01-13 ENCOUNTER — Encounter: Payer: Self-pay | Admitting: Internal Medicine

## 2015-01-13 ENCOUNTER — Other Ambulatory Visit (INDEPENDENT_AMBULATORY_CARE_PROVIDER_SITE_OTHER): Payer: Medicare Other

## 2015-01-13 ENCOUNTER — Encounter: Payer: Self-pay | Admitting: Adult Health

## 2015-01-13 ENCOUNTER — Ambulatory Visit (INDEPENDENT_AMBULATORY_CARE_PROVIDER_SITE_OTHER)
Admission: RE | Admit: 2015-01-13 | Discharge: 2015-01-13 | Disposition: A | Discharge status: Home / Self Care | Payer: Medicare Other | Source: Ambulatory Visit | Attending: Adult Health | Admitting: Adult Health

## 2015-01-13 ENCOUNTER — Ambulatory Visit (INDEPENDENT_AMBULATORY_CARE_PROVIDER_SITE_OTHER): Payer: Medicare Other | Admitting: Internal Medicine

## 2015-01-13 VITALS — BP 112/80 | HR 108 | Temp 98.1°F | Ht 68.0 in | Wt 292.5 lb

## 2015-01-13 DIAGNOSIS — M545 Low back pain, unspecified: Secondary | ICD-10-CM

## 2015-01-13 DIAGNOSIS — J9611 Chronic respiratory failure with hypoxia: Secondary | ICD-10-CM | POA: Diagnosis not present

## 2015-01-13 DIAGNOSIS — I270 Primary pulmonary hypertension: Secondary | ICD-10-CM

## 2015-01-13 DIAGNOSIS — I5032 Chronic diastolic (congestive) heart failure: Secondary | ICD-10-CM

## 2015-01-13 DIAGNOSIS — J439 Emphysema, unspecified: Secondary | ICD-10-CM | POA: Diagnosis not present

## 2015-01-13 DIAGNOSIS — IMO0002 Reserved for concepts with insufficient information to code with codable children: Secondary | ICD-10-CM

## 2015-01-13 DIAGNOSIS — D869 Sarcoidosis, unspecified: Secondary | ICD-10-CM | POA: Diagnosis not present

## 2015-01-13 DIAGNOSIS — E1165 Type 2 diabetes mellitus with hyperglycemia: Secondary | ICD-10-CM

## 2015-01-13 DIAGNOSIS — G8929 Other chronic pain: Secondary | ICD-10-CM

## 2015-01-13 DIAGNOSIS — J189 Pneumonia, unspecified organism: Secondary | ICD-10-CM

## 2015-01-13 DIAGNOSIS — I272 Pulmonary hypertension, unspecified: Secondary | ICD-10-CM

## 2015-01-13 DIAGNOSIS — J449 Chronic obstructive pulmonary disease, unspecified: Secondary | ICD-10-CM | POA: Diagnosis not present

## 2015-01-13 DIAGNOSIS — J9620 Acute and chronic respiratory failure, unspecified whether with hypoxia or hypercapnia: Secondary | ICD-10-CM | POA: Diagnosis not present

## 2015-01-13 DIAGNOSIS — R0602 Shortness of breath: Secondary | ICD-10-CM | POA: Diagnosis not present

## 2015-01-13 LAB — CBC WITH DIFFERENTIAL/PLATELET
Basophils Absolute: 0 10*3/uL (ref 0.0–0.1)
Basophils Relative: 0.5 % (ref 0.0–3.0)
EOS ABS: 0.3 10*3/uL (ref 0.0–0.7)
Eosinophils Relative: 3 % (ref 0.0–5.0)
HCT: 47.4 % — ABNORMAL HIGH (ref 36.0–46.0)
Hemoglobin: 15.8 g/dL — ABNORMAL HIGH (ref 12.0–15.0)
LYMPHS ABS: 1.6 10*3/uL (ref 0.7–4.0)
Lymphocytes Relative: 18.4 % (ref 12.0–46.0)
MCHC: 33.4 g/dL (ref 30.0–36.0)
MCV: 76.6 fl — AB (ref 78.0–100.0)
MONO ABS: 0.8 10*3/uL (ref 0.1–1.0)
Monocytes Relative: 9.5 % (ref 3.0–12.0)
NEUTROS PCT: 68.6 % (ref 43.0–77.0)
Neutro Abs: 6.1 10*3/uL (ref 1.4–7.7)
PLATELETS: 210 10*3/uL (ref 150.0–400.0)
RBC: 6.19 Mil/uL — ABNORMAL HIGH (ref 3.87–5.11)
RDW: 18.8 % — AB (ref 11.5–15.5)
WBC: 8.9 10*3/uL (ref 4.0–10.5)

## 2015-01-13 LAB — COMPREHENSIVE METABOLIC PANEL
ALK PHOS: 144 U/L — AB (ref 39–117)
ALT: 90 U/L — ABNORMAL HIGH (ref 0–35)
AST: 33 U/L (ref 0–37)
Albumin: 4.6 g/dL (ref 3.5–5.2)
BUN: 19 mg/dL (ref 6–23)
CALCIUM: 12.1 mg/dL — AB (ref 8.4–10.5)
CO2: 31 mEq/L (ref 19–32)
Chloride: 83 mEq/L — ABNORMAL LOW (ref 96–112)
Creatinine, Ser: 1.43 mg/dL — ABNORMAL HIGH (ref 0.40–1.20)
GFR: 50.38 mL/min — ABNORMAL LOW (ref >60.00)
Glucose, Bld: 372 mg/dL — ABNORMAL HIGH (ref 70–99)
POTASSIUM: 4.4 meq/L (ref 3.5–5.1)
SODIUM: 129 meq/L — AB (ref 135–145)
TOTAL PROTEIN: 8.9 g/dL — AB (ref 6.0–8.3)
Total Bilirubin: 1.2 mg/dL (ref 0.2–1.2)

## 2015-01-13 LAB — HEPATIC FUNCTION PANEL
ALBUMIN: 4.6 g/dL (ref 3.5–5.2)
ALT: 90 U/L — ABNORMAL HIGH (ref 0–35)
AST: 33 U/L (ref 0–37)
Alkaline Phosphatase: 144 U/L — ABNORMAL HIGH (ref 39–117)
Bilirubin, Direct: 0.3 mg/dL (ref 0.0–0.3)
TOTAL PROTEIN: 8.9 g/dL — AB (ref 6.0–8.3)
Total Bilirubin: 1.2 mg/dL (ref 0.2–1.2)

## 2015-01-13 LAB — BASIC METABOLIC PANEL
BUN: 19 mg/dL (ref 6–23)
CO2: 31 mEq/L (ref 19–32)
Calcium: 12.1 mg/dL — ABNORMAL HIGH (ref 8.4–10.5)
Chloride: 83 mEq/L — ABNORMAL LOW (ref 96–112)
Creatinine, Ser: 1.43 mg/dL — ABNORMAL HIGH (ref 0.40–1.20)
GFR: 50.38 mL/min — AB (ref >60.00)
Glucose, Bld: 372 mg/dL — ABNORMAL HIGH (ref 70–99)
POTASSIUM: 4.4 meq/L (ref 3.5–5.1)
SODIUM: 129 meq/L — AB (ref 135–145)

## 2015-01-13 MED ORDER — INSULIN GLARGINE 100 UNIT/ML SOLOSTAR PEN: 40.0000 [IU] | PEN_INJECTOR | Freq: Two times a day (BID) | SUBCUTANEOUS | Status: DC

## 2015-01-13 NOTE — Progress Notes (Signed)
Quick Note:  Called spoke with patient, advised of lab results / recs as stated by TP. Pt verbalized her understanding and denied any questions. Will route results to Dr. Asa Lente. ______

## 2015-01-13 NOTE — Progress Notes (Signed)
Pre visit review using our clinic review tool, if applicable. No additional management support is needed unless otherwise documented below in the visit note. 

## 2015-01-13 NOTE — Assessment & Plan Note (Signed)
New dx 11/2011 - exacerbated by frequent steroid use for bronchospastic pulmonary disease Intol of metformin due to severe diarrhea side effects  On Lantus pen BID, Novolog TID AC - titrate as needed -changes in dose reviewed today Declined to start low dose Victoza 10/2013 as rx'd also assist with patient's weight loss goals for controlling appetite? check a1c q3-2mo and titrate as needed Lab Results  Component Value Date   HGBA1C 9.4* 12/19/2014

## 2015-01-13 NOTE — Patient Instructions (Signed)
It was good to see you today.  We have reviewed your prior records including labs and tests today  Test(s) ordered today. Your results will be released to [place] (or called to you) after review, usually within 72hours after test completion. If any changes need to be made, you will be notified at that same time.  Medications reviewed and updated Change Lantus to 40 units twice daily and use sliding scale insulin in addition to scheduled NovoLog No other changes recommended at this time.  we'll make referral to new pain clinic as requested. Our office will contact you regarding appointment(s) once made. Also feel free to follow-up with back specialist Dr. Maia Petties for injections as needed  Please schedule followup in 2-3 months, call sooner if problems.

## 2015-01-13 NOTE — Telephone Encounter (Signed)
Received OV from pain clinic.

## 2015-01-13 NOTE — Progress Notes (Signed)
Quick Note:  Called spoke with patient, advised of cxr results / recs as stated by TP. Pt verbalized her understanding and denied any questions. Pt has 5.24.16 ov w/ KC and is aware to keep that appt. ______

## 2015-01-13 NOTE — Assessment & Plan Note (Signed)
freq exacerbations complicated with multifactorial disease and pulmonary overlap Last hospitalization reviewed Chronic high dose diuretic -  Hx zaroxlyn 3x/week 07/2012 but stopped due to ARF with over diuresis Also encouraged CPAP compliance and O2 continuous for chronic resp failure Continue monitor sodium intake and monitor weights at home - call if increase weight >3#/day Encouragement provided on general weight loss efforts follow up cards and pulm as planned

## 2015-01-13 NOTE — Progress Notes (Signed)
Subjective:    Patient ID: OTA EBERSOLE, female    DOB: 1965-11-09, 48 y.o.   MRN: [id number]  HPI  Patient here for follow-up. Reviewed chronic issues and interval events since last office visit  Past Medical History  Diagnosis Date  . Hypertension   . Hyperlipidemia   . Chronic headache   . Fibromyalgia     daily narcotics  . Anxiety     hx chronic BZ use, stopped 07/2010  . Anemia   . Pulmonary sarcoidosis     unimpressive CT chest 2011  . Colonic polyp   . GERD (gastroesophageal reflux disease)   . ALLERGIC RHINITIS   . Asthma   . CHF (congestive heart failure)     Diastolic with fluid overload, May, 2012, LVEF 60%  . Morbid obesity   . Depression   . Panic attacks   . Diabetes mellitus   . COPD (chronic obstructive pulmonary disease)     on home O2, moderate airflow obstruction, suspect d/t emphysema  . Obesity   . Elevated LFTs 09/2011  . Ovarian cyst   . Chronic back pain   . Sleep apnea      CPAP  . Fatty liver   . Esophagitis   . Gastritis   . Internal hemorrhoids   . Adenomatous colon polyp 12/03/09    [facility] in [place], [place]    Review of Systems  Constitutional: Positive for fatigue. Negative for fever and unexpected weight change.  Respiratory: Positive for shortness of breath (slightly worse than baseline in past 48h). Negative for cough.   Cardiovascular: Positive for leg swelling. Negative for chest pain and palpitations.  Musculoskeletal:       Chronic pain "all over"       Objective:    Physical Exam  Constitutional: She appears well-developed and well-nourished. No distress.  MO, wearing [place] O2  Cardiovascular: Normal rate, regular rhythm and normal heart sounds.   No murmur heard. Pulmonary/Chest:  Diminished air movement bilaterally. Pseudo-wheeze with fine bibasilar crackles. Mild increased work of breathing with exertion including talking. No distress at rest  Musculoskeletal: She exhibits edema.    BP 112/80 mmHg   Pulse 108  Temp(Src) 98.1 F (36.7 C) (Oral)  Ht 5\' 8"  (1.727 m)  Wt 292 lb 8 oz (132.677 kg)  BMI 44.48 kg/m2  SpO2 98%  LMP 10/16/2012 Wt Readings from Last 3 Encounters:  01/13/15 292 lb 8 oz (132.677 kg)  01/11/15 298 lb 12.8 oz (135.535 kg)  12/28/14 327 lb 2.6 oz (148.4 kg)    Lab Results  Component Value Date   WBC 9.4 12/26/2014   HGB 12.8 12/26/2014   HCT 40.8 12/26/2014   PLT 188 12/26/2014   GLUCOSE 257* 01/11/2015   CHOL 262* 04/06/2014   TRIG 229.0* 04/06/2014   HDL 38.00* 04/06/2014   LDLDIRECT 204.3 09/03/2013   LDLCALC 178* 04/06/2014   ALT 212* 12/28/2014   AST 38* 12/28/2014   NA 134* 01/11/2015   K 3.2* 01/11/2015   CL 91* 01/11/2015   CREATININE 0.88 01/11/2015   BUN 11 01/11/2015   CO2 34* 01/11/2015   TSH 0.508 07/21/2014   INR 1.03 12/19/2014   HGBA1C 9.4* 12/19/2014   MICROALBUR 0.4 04/06/2014    Ct Angio Chest Pe W/cm &/or Wo Cm  12/18/2014   CLINICAL DATA:  Shortness breath, chest pain. History of sarcoidosis, COPD.  EXAM: CT ANGIOGRAPHY CHEST WITH CONTRAST  TECHNIQUE: Multidetector CT imaging of the chest was performed  using the standard protocol during bolus administration of intravenous contrast. Multiplanar CT image reconstructions and MIPs were obtained to evaluate the vascular anatomy.  CONTRAST:  55mL OMNIPAQUE IOHEXOL 350 MG/ML SOLN  COMPARISON:  08/04/2013  FINDINGS: No filling defects in the pulmonary arteries to suggest pulmonary emboli. The distal vessels are not well visualized due to respiratory motion. Mild cardiomegaly. Aorta is normal caliber. Pulmonary artery remains dilated measuring 36 mm compared to 38 mm previously. Central pulmonary arteries appear prominent. Findings suggest pulmonary arterial hypertension.  Mild emphysema/COPD. Diffuse ground-glass opacities throughout the lungs are stable since prior study. No pleural effusions. Chronic bibasilar densities likely reflect scarring. No pleural effusions.  Small scattered  borderline sized mediastinal and bilateral hilar lymph nodes, similar to prior study. No axillary adenopathy.  Chest wall soft tissues are unremarkable. Imaging into the upper abdomen demonstrates diffuse fatty infiltration of the liver. No acute findings.  No acute bony abnormality or focal bone lesion.  Review of the MIP images confirms the above findings.  IMPRESSION: No evidence of pulmonary embolus. The distal peripheral vessels are not well visualized due to respiratory motion.  Borderline heart size. Stable dilatation of the pulmonary arteries compatible with pulmonary arterial hypertension.  Underline COPD. Stable diffuse ground-glass opacities throughout the lungs and bibasilar scarring.   Electronically Signed   By: Rolm Baptise M.D.   On: 12/18/2014 23:55   Dg Chest Portable 1 View  12/18/2014   CLINICAL DATA:  Shortness of Breath, acute  EXAM: PORTABLE CHEST - 1 VIEW  COMPARISON:  December 16, 2014  FINDINGS: There is interstitial edema superimposed on emphysematous change and bibasilar scarring. Heart is mildly enlarged with pulmonary venous hypertension. No adenopathy. No bone lesions.  IMPRESSION: Congestive heart failure superimposed on emphysematous change. No airspace consolidation.   Electronically Signed   By: Lowella Grip III M.D.   On: 12/18/2014 19:20       Assessment & Plan:   Problem List Items Addressed This Visit    Chronic diastolic CHF (congestive heart failure)    freq exacerbations complicated with multifactorial disease and pulmonary overlap Last hospitalization reviewed Chronic high dose diuretic -  Hx zaroxlyn 3x/week 07/2012 but stopped due to ARF with over diuresis Also encouraged CPAP compliance and O2 continuous for chronic resp failure Continue monitor sodium intake and monitor weights at home - call if increase weight >3#/day Encouragement provided on general weight loss efforts follow up cards and pulm as planned      Relevant Orders   Basic metabolic  panel   Hepatic function panel   Chronic low back pain - Primary    RLE numbness and pain 06/2011> MRI with free fragment and encroachment on L4 root -  s/p R L4-5 microdiscectomy 07/06/2011 > symptoms improved but not resolved Repeat neurosurgical evaluation 2013 and February 2014, patient declined repeat surgical intervention at that time Then eval by ortho spine - tooke - recommended ESI, but pt declined Exacerbated by accidental fall December 2014 with recurrent intermittent severe pain New MRI lumbar 10/2013 - then following and spine and scoli, intermittent since 2015 Continue muscle relaxant New referral to pain mgmt for oxy as needed -interval issues reviewed -patient understands narcotics will not be prescribed by this office      Relevant Orders   Ambulatory referral to Pain Clinic   Chronic respiratory failure    Frequent acute exacerbations, multifactorial with COPD, diastolic heart failure, right-sided heart failure, pulmonary sarcoidosis -recent discontinuation of tobacco abuse reviewed Recent hospitalization  for same reviewed -currently at baseline continue bipap at home, on nasal mask since 10/14 hosp Reviewed patient's commitment to compliance with medication and equipment instruction baseline hypercarbia reviewed ongoing chronic inhalers (spiriva and symbicort) with prn nebulizer continue home oxygen and followup with pulmonary and cardiology specialists as planned encouraged never to smoke again!      Diabetes type 2, uncontrolled    New dx 11/2011 - exacerbated by frequent steroid use for bronchospastic pulmonary disease Intol of metformin due to severe diarrhea side effects  On Lantus pen BID, Novolog TID AC - titrate as needed -changes in dose reviewed today Declined to start low dose Victoza 10/2013 as rx'd also assist with patient's weight loss goals for controlling appetite? check a1c q3-26mo and titrate as needed Lab Results  Component Value Date   HGBA1C 9.4*  12/19/2014        Relevant Medications   Insulin Glargine (LANTUS SOLOSTAR) 100 UNIT/ML Solostar Pen       Gwendolyn Grant, MD

## 2015-01-13 NOTE — Assessment & Plan Note (Signed)
RLE numbness and pain 06/2011> MRI with free fragment and encroachment on L4 root -  s/p R L4-5 microdiscectomy 07/06/2011 > symptoms improved but not resolved Repeat neurosurgical evaluation 2013 and February 2014, patient declined repeat surgical intervention at that time Then eval by ortho spine - tooke - recommended ESI, but pt declined Exacerbated by accidental fall December 2014 with recurrent intermittent severe pain New MRI lumbar 10/2013 - then following and spine and scoli, intermittent since 2015 Continue muscle relaxant New referral to pain mgmt for oxy as needed -interval issues reviewed -patient understands narcotics will not be prescribed by this office

## 2015-01-13 NOTE — Assessment & Plan Note (Signed)
Frequent acute exacerbations, multifactorial with COPD, diastolic heart failure, right-sided heart failure, pulmonary sarcoidosis -recent discontinuation of tobacco abuse reviewed Recent hospitalization for same reviewed -currently at baseline continue bipap at home, on nasal mask since 10/14 hosp Reviewed patient's commitment to compliance with medication and equipment instruction baseline hypercarbia reviewed ongoing chronic inhalers (spiriva and symbicort) with prn nebulizer continue home oxygen and followup with pulmonary and cardiology specialists as planned encouraged never to smoke again!

## 2015-01-14 ENCOUNTER — Other Ambulatory Visit: Payer: Self-pay | Admitting: Pulmonary Disease

## 2015-01-14 ENCOUNTER — Encounter (HOSPITAL_COMMUNITY): Payer: Self-pay | Admitting: Cardiology

## 2015-01-14 ENCOUNTER — Encounter: Payer: Self-pay | Admitting: Internal Medicine

## 2015-01-14 ENCOUNTER — Observation Stay (HOSPITAL_COMMUNITY)
Admission: EM | Admit: 2015-01-14 | Discharge: 2015-01-16 | Disposition: A | Discharge status: Home / Self Care | Payer: Medicare Other | Attending: Internal Medicine | Admitting: Internal Medicine

## 2015-01-14 ENCOUNTER — Emergency Department (HOSPITAL_COMMUNITY): Payer: Medicare Other

## 2015-01-14 DIAGNOSIS — N179 Acute kidney failure, unspecified: Secondary | ICD-10-CM | POA: Diagnosis present

## 2015-01-14 DIAGNOSIS — K219 Gastro-esophageal reflux disease without esophagitis: Secondary | ICD-10-CM | POA: Diagnosis not present

## 2015-01-14 DIAGNOSIS — E119 Type 2 diabetes mellitus without complications: Secondary | ICD-10-CM | POA: Diagnosis not present

## 2015-01-14 DIAGNOSIS — J449 Chronic obstructive pulmonary disease, unspecified: Secondary | ICD-10-CM | POA: Insufficient documentation

## 2015-01-14 DIAGNOSIS — Z91040 Latex allergy status: Secondary | ICD-10-CM | POA: Insufficient documentation

## 2015-01-14 DIAGNOSIS — J439 Emphysema, unspecified: Secondary | ICD-10-CM

## 2015-01-14 DIAGNOSIS — Z794 Long term (current) use of insulin: Secondary | ICD-10-CM | POA: Diagnosis not present

## 2015-01-14 DIAGNOSIS — I10 Essential (primary) hypertension: Secondary | ICD-10-CM | POA: Diagnosis not present

## 2015-01-14 DIAGNOSIS — Z791 Long term (current) use of non-steroidal anti-inflammatories (NSAID): Secondary | ICD-10-CM | POA: Diagnosis not present

## 2015-01-14 DIAGNOSIS — Z87891 Personal history of nicotine dependence: Secondary | ICD-10-CM | POA: Insufficient documentation

## 2015-01-14 DIAGNOSIS — E662 Morbid (severe) obesity with alveolar hypoventilation: Secondary | ICD-10-CM

## 2015-01-14 DIAGNOSIS — R1110 Vomiting, unspecified: Secondary | ICD-10-CM

## 2015-01-14 DIAGNOSIS — R197 Diarrhea, unspecified: Secondary | ICD-10-CM | POA: Diagnosis present

## 2015-01-14 DIAGNOSIS — R112 Nausea with vomiting, unspecified: Secondary | ICD-10-CM

## 2015-01-14 DIAGNOSIS — M545 Low back pain, unspecified: Secondary | ICD-10-CM

## 2015-01-14 DIAGNOSIS — R109 Unspecified abdominal pain: Secondary | ICD-10-CM

## 2015-01-14 DIAGNOSIS — I5032 Chronic diastolic (congestive) heart failure: Secondary | ICD-10-CM | POA: Diagnosis not present

## 2015-01-14 DIAGNOSIS — Z862 Personal history of diseases of the blood and blood-forming organs and certain disorders involving the immune mechanism: Secondary | ICD-10-CM | POA: Insufficient documentation

## 2015-01-14 DIAGNOSIS — I5030 Unspecified diastolic (congestive) heart failure: Secondary | ICD-10-CM | POA: Diagnosis not present

## 2015-01-14 DIAGNOSIS — M5431 Sciatica, right side: Secondary | ICD-10-CM

## 2015-01-14 DIAGNOSIS — I272 Pulmonary hypertension, unspecified: Secondary | ICD-10-CM

## 2015-01-14 DIAGNOSIS — R0902 Hypoxemia: Secondary | ICD-10-CM

## 2015-01-14 DIAGNOSIS — R1011 Right upper quadrant pain: Principal | ICD-10-CM | POA: Insufficient documentation

## 2015-01-14 DIAGNOSIS — G8929 Other chronic pain: Secondary | ICD-10-CM | POA: Insufficient documentation

## 2015-01-14 DIAGNOSIS — R000 Tachycardia, unspecified: Secondary | ICD-10-CM | POA: Insufficient documentation

## 2015-01-14 DIAGNOSIS — Z9981 Dependence on supplemental oxygen: Secondary | ICD-10-CM | POA: Diagnosis not present

## 2015-01-14 DIAGNOSIS — Z8742 Personal history of other diseases of the female genital tract: Secondary | ICD-10-CM | POA: Diagnosis not present

## 2015-01-14 DIAGNOSIS — Z86010 Personal history of colonic polyps: Secondary | ICD-10-CM | POA: Diagnosis not present

## 2015-01-14 DIAGNOSIS — D869 Sarcoidosis, unspecified: Secondary | ICD-10-CM

## 2015-01-14 DIAGNOSIS — F411 Generalized anxiety disorder: Secondary | ICD-10-CM | POA: Diagnosis not present

## 2015-01-14 DIAGNOSIS — E1165 Type 2 diabetes mellitus with hyperglycemia: Secondary | ICD-10-CM | POA: Diagnosis not present

## 2015-01-14 DIAGNOSIS — K760 Fatty (change of) liver, not elsewhere classified: Secondary | ICD-10-CM | POA: Diagnosis not present

## 2015-01-14 DIAGNOSIS — Z7982 Long term (current) use of aspirin: Secondary | ICD-10-CM | POA: Diagnosis not present

## 2015-01-14 DIAGNOSIS — R160 Hepatomegaly, not elsewhere classified: Secondary | ICD-10-CM | POA: Diagnosis not present

## 2015-01-14 DIAGNOSIS — Z720 Tobacco use: Secondary | ICD-10-CM

## 2015-01-14 DIAGNOSIS — J9620 Acute and chronic respiratory failure, unspecified whether with hypoxia or hypercapnia: Secondary | ICD-10-CM | POA: Diagnosis not present

## 2015-01-14 DIAGNOSIS — R1013 Epigastric pain: Secondary | ICD-10-CM | POA: Diagnosis not present

## 2015-01-14 DIAGNOSIS — I50810 Right heart failure, unspecified: Secondary | ICD-10-CM

## 2015-01-14 DIAGNOSIS — E785 Hyperlipidemia, unspecified: Secondary | ICD-10-CM

## 2015-01-14 DIAGNOSIS — F329 Major depressive disorder, single episode, unspecified: Secondary | ICD-10-CM | POA: Insufficient documentation

## 2015-01-14 DIAGNOSIS — J9610 Chronic respiratory failure, unspecified whether with hypoxia or hypercapnia: Secondary | ICD-10-CM

## 2015-01-14 DIAGNOSIS — R6883 Chills (without fever): Secondary | ICD-10-CM | POA: Insufficient documentation

## 2015-01-14 DIAGNOSIS — M25561 Pain in right knee: Secondary | ICD-10-CM

## 2015-01-14 DIAGNOSIS — R079 Chest pain, unspecified: Secondary | ICD-10-CM | POA: Diagnosis not present

## 2015-01-14 DIAGNOSIS — IMO0002 Reserved for concepts with insufficient information to code with codable children: Secondary | ICD-10-CM

## 2015-01-14 DIAGNOSIS — R7989 Other specified abnormal findings of blood chemistry: Secondary | ICD-10-CM

## 2015-01-14 DIAGNOSIS — G4733 Obstructive sleep apnea (adult) (pediatric): Secondary | ICD-10-CM

## 2015-01-14 DIAGNOSIS — N939 Abnormal uterine and vaginal bleeding, unspecified: Secondary | ICD-10-CM

## 2015-01-14 DIAGNOSIS — F32A Depression, unspecified: Secondary | ICD-10-CM

## 2015-01-14 DIAGNOSIS — Z79899 Other long term (current) drug therapy: Secondary | ICD-10-CM | POA: Diagnosis not present

## 2015-01-14 DIAGNOSIS — R4689 Other symptoms and signs involving appearance and behavior: Secondary | ICD-10-CM

## 2015-01-14 DIAGNOSIS — R9430 Abnormal result of cardiovascular function study, unspecified: Secondary | ICD-10-CM

## 2015-01-14 DIAGNOSIS — G4730 Sleep apnea, unspecified: Secondary | ICD-10-CM | POA: Diagnosis not present

## 2015-01-14 LAB — COMPREHENSIVE METABOLIC PANEL
ALBUMIN: 4.2 g/dL (ref 3.5–5.2)
ALT: 79 U/L — AB (ref 0–35)
ANION GAP: 18 — AB (ref 5–15)
AST: 47 U/L — AB (ref 0–37)
Alkaline Phosphatase: 109 U/L (ref 39–117)
BUN: 20 mg/dL (ref 6–23)
CALCIUM: 10.3 mg/dL (ref 8.4–10.5)
CO2: 27 mmol/L (ref 19–32)
Chloride: 85 mmol/L — ABNORMAL LOW (ref 96–112)
Creatinine, Ser: 1.49 mg/dL — ABNORMAL HIGH (ref 0.50–1.10)
GFR calc non Af Amer: 41 mL/min — ABNORMAL LOW (ref >90)
GFR, EST AFRICAN AMERICAN: 47 mL/min — AB (ref >90)
Glucose, Bld: 228 mg/dL — ABNORMAL HIGH (ref 70–99)
Potassium: 3.7 mmol/L (ref 3.5–5.1)
SODIUM: 130 mmol/L — AB (ref 135–145)
Total Bilirubin: 1.8 mg/dL — ABNORMAL HIGH (ref 0.3–1.2)
Total Protein: 8.5 g/dL — ABNORMAL HIGH (ref 6.0–8.3)

## 2015-01-14 LAB — I-STAT CG4 LACTIC ACID, ED
LACTIC ACID, VENOUS: 4.4 mmol/L — AB (ref 0.5–2.0)
LACTIC ACID, VENOUS: 2.82 mmol/L — AB (ref 0.5–2.0)

## 2015-01-14 LAB — URINALYSIS, ROUTINE W REFLEX MICROSCOPIC
Bilirubin Urine: NEGATIVE
GLUCOSE, UA: NEGATIVE mg/dL
Hgb urine dipstick: NEGATIVE
Ketones, ur: NEGATIVE mg/dL
LEUKOCYTES UA: NEGATIVE
NITRITE: NEGATIVE
Protein, ur: NEGATIVE mg/dL
SPECIFIC GRAVITY, URINE: 1.011 (ref 1.005–1.030)
Urobilinogen, UA: 0.2 mg/dL (ref 0.0–1.0)
pH: 6 (ref 5.0–8.0)

## 2015-01-14 LAB — CBC WITH DIFFERENTIAL/PLATELET
BASOS ABS: 0.1 10*3/uL (ref 0.0–0.1)
BASOS PCT: 1 % (ref 0–1)
Eosinophils Absolute: 0.3 10*3/uL (ref 0.0–0.7)
Eosinophils Relative: 4 % (ref 0–5)
HCT: 47 % — ABNORMAL HIGH (ref 36.0–46.0)
Hemoglobin: 16.1 g/dL — ABNORMAL HIGH (ref 12.0–15.0)
Lymphocytes Relative: 18 % (ref 12–46)
Lymphs Abs: 1.4 10*3/uL (ref 0.7–4.0)
MCH: 26.5 pg (ref 26.0–34.0)
MCHC: 34.3 g/dL (ref 30.0–36.0)
MCV: 77.3 fL — ABNORMAL LOW (ref 78.0–100.0)
Monocytes Absolute: 0.7 10*3/uL (ref 0.1–1.0)
Monocytes Relative: 8 % (ref 3–12)
NEUTROS ABS: 5.5 10*3/uL (ref 1.7–7.7)
NEUTROS PCT: 69 % (ref 43–77)
PLATELETS: 190 10*3/uL (ref 150–400)
RBC: 6.08 MIL/uL — ABNORMAL HIGH (ref 3.87–5.11)
RDW: 16.8 % — AB (ref 11.5–15.5)
WBC: 8 10*3/uL (ref 4.0–10.5)

## 2015-01-14 LAB — MAGNESIUM: MAGNESIUM: 1.4 mg/dL — AB (ref 1.5–2.5)

## 2015-01-14 LAB — GLUCOSE, CAPILLARY: Glucose-Capillary: 247 mg/dL — ABNORMAL HIGH (ref 70–99)

## 2015-01-14 LAB — LIPASE, BLOOD: Lipase: 44 U/L (ref 11–59)

## 2015-01-14 LAB — CBG MONITORING, ED: Glucose-Capillary: 249 mg/dL — ABNORMAL HIGH (ref 70–99)

## 2015-01-14 MED ORDER — PROMETHAZINE HCL 25 MG RE SUPP
25.0000 mg | Freq: Four times a day (QID) | RECTAL | Status: DC | PRN
  Filled 2015-01-14: qty 1

## 2015-01-14 MED ORDER — TIZANIDINE HCL 4 MG PO TABS
4.0000 mg | ORAL_TABLET | Freq: Three times a day (TID) | ORAL | Status: DC | PRN
  Filled 2015-01-14: qty 1

## 2015-01-14 MED ORDER — ALBUTEROL SULFATE (2.5 MG/3ML) 0.083% IN NEBU: 2.5000 mg | INHALATION_SOLUTION | Freq: Four times a day (QID) | RESPIRATORY_TRACT | Status: DC | PRN

## 2015-01-14 MED ORDER — PANTOPRAZOLE SODIUM 40 MG PO TBEC
80.0000 mg | DELAYED_RELEASE_TABLET | Freq: Two times a day (BID) | ORAL | Status: DC
  Administered 2015-01-15 – 2015-01-16 (×4): 80 mg via ORAL
  Filled 2015-01-14 (×4): qty 2

## 2015-01-14 MED ORDER — VANCOMYCIN HCL 10 G IV SOLR
2000.0000 mg | Freq: Once | INTRAVENOUS | Status: AC
  Administered 2015-01-14: 2000 mg via INTRAVENOUS
  Filled 2015-01-14: qty 2000

## 2015-01-14 MED ORDER — VENLAFAXINE HCL ER 37.5 MG PO CP24
37.5000 mg | ORAL_CAPSULE | Freq: Every day | ORAL | Status: DC
  Administered 2015-01-15 – 2015-01-16 (×2): 37.5 mg via ORAL
  Filled 2015-01-14 (×3): qty 1

## 2015-01-14 MED ORDER — PROMETHAZINE HCL 25 MG/ML IJ SOLN
12.5000 mg | Freq: Four times a day (QID) | INTRAMUSCULAR | Status: DC | PRN
  Administered 2015-01-14 – 2015-01-16 (×4): 12.5 mg via INTRAVENOUS
  Filled 2015-01-14 (×4): qty 1

## 2015-01-14 MED ORDER — VARENICLINE TARTRATE 0.5 MG PO TABS
0.5000 mg | ORAL_TABLET | Freq: Two times a day (BID) | ORAL | Status: DC
  Administered 2015-01-15 – 2015-01-16 (×4): 0.5 mg via ORAL
  Filled 2015-01-14 (×6): qty 1

## 2015-01-14 MED ORDER — GABAPENTIN 300 MG PO CAPS
300.0000 mg | ORAL_CAPSULE | Freq: Every day | ORAL | Status: DC
  Administered 2015-01-15 (×2): 300 mg via ORAL
  Filled 2015-01-14 (×3): qty 1

## 2015-01-14 MED ORDER — ASPIRIN EC 81 MG PO TBEC
81.0000 mg | DELAYED_RELEASE_TABLET | Freq: Every day | ORAL | Status: DC
  Administered 2015-01-15 – 2015-01-16 (×2): 81 mg via ORAL
  Filled 2015-01-14 (×2): qty 1

## 2015-01-14 MED ORDER — FLUTICASONE PROPIONATE 50 MCG/ACT NA SUSP
2.0000 | Freq: Every day | NASAL | Status: DC
  Administered 2015-01-15 – 2015-01-16 (×2): 2 via NASAL
  Filled 2015-01-14: qty 16

## 2015-01-14 MED ORDER — PROMETHAZINE HCL 25 MG PO TABS
25.0000 mg | ORAL_TABLET | Freq: Four times a day (QID) | ORAL | Status: DC | PRN
  Filled 2015-01-14: qty 1

## 2015-01-14 MED ORDER — PIPERACILLIN-TAZOBACTAM IN DEX 2-0.25 GM/50ML IV SOLN: 2.2500 g | Freq: Once | INTRAVENOUS | Status: DC

## 2015-01-14 MED ORDER — HYDROXYZINE HCL 10 MG PO TABS
10.0000 mg | ORAL_TABLET | Freq: Three times a day (TID) | ORAL | Status: DC | PRN
  Filled 2015-01-14: qty 1

## 2015-01-14 MED ORDER — SODIUM CHLORIDE 0.9 % IV BOLUS (SEPSIS)
500.0000 mL | Freq: Once | INTRAVENOUS | Status: AC
  Administered 2015-01-14: 500 mL via INTRAVENOUS

## 2015-01-14 MED ORDER — INSULIN ASPART 100 UNIT/ML ~~LOC~~ SOLN
0.0000 [IU] | SUBCUTANEOUS | Status: DC
  Administered 2015-01-14: 5 [IU] via SUBCUTANEOUS
  Administered 2015-01-15: 8 [IU] via SUBCUTANEOUS
  Administered 2015-01-15: 3 [IU] via SUBCUTANEOUS
  Administered 2015-01-15: 5 [IU] via SUBCUTANEOUS
  Administered 2015-01-15: 3 [IU] via SUBCUTANEOUS
  Administered 2015-01-15: 5 [IU] via SUBCUTANEOUS
  Administered 2015-01-16 (×2): 3 [IU] via SUBCUTANEOUS
  Administered 2015-01-16: 2 [IU] via SUBCUTANEOUS
  Administered 2015-01-16: 5 [IU] via SUBCUTANEOUS
  Administered 2015-01-16: 2 [IU] via SUBCUTANEOUS

## 2015-01-14 MED ORDER — SODIUM CHLORIDE 0.9 % IV SOLN
INTRAVENOUS | Status: DC
  Administered 2015-01-14 – 2015-01-16 (×2): via INTRAVENOUS

## 2015-01-14 MED ORDER — PIPERACILLIN-TAZOBACTAM 3.375 G IVPB 30 MIN
3.3750 g | Freq: Once | INTRAVENOUS | Status: AC
  Administered 2015-01-14: 3.375 g via INTRAVENOUS
  Filled 2015-01-14: qty 50

## 2015-01-14 MED ORDER — ALPRAZOLAM 0.5 MG PO TABS
0.5000 mg | ORAL_TABLET | Freq: Three times a day (TID) | ORAL | Status: DC
  Administered 2015-01-14 – 2015-01-16 (×6): 0.5 mg via ORAL
  Filled 2015-01-14 (×6): qty 1

## 2015-01-14 MED ORDER — INSULIN GLARGINE 100 UNIT/ML ~~LOC~~ SOLN
30.0000 [IU] | Freq: Two times a day (BID) | SUBCUTANEOUS | Status: DC
  Administered 2015-01-14 – 2015-01-16 (×4): 30 [IU] via SUBCUTANEOUS
  Filled 2015-01-14 (×5): qty 0.3

## 2015-01-14 MED ORDER — HYDROMORPHONE HCL 1 MG/ML IJ SOLN
0.5000 mg | Freq: Once | INTRAMUSCULAR | Status: AC
  Administered 2015-01-14: 0.5 mg via INTRAVENOUS
  Filled 2015-01-14: qty 1

## 2015-01-14 MED ORDER — ONDANSETRON HCL 4 MG/2ML IJ SOLN
4.0000 mg | Freq: Once | INTRAMUSCULAR | Status: AC
  Administered 2015-01-14: 4 mg via INTRAVENOUS
  Filled 2015-01-14: qty 2

## 2015-01-14 MED ORDER — TIOTROPIUM BROMIDE MONOHYDRATE 18 MCG IN CAPS
18.0000 ug | ORAL_CAPSULE | Freq: Every day | RESPIRATORY_TRACT | Status: DC
  Administered 2015-01-15 – 2015-01-16 (×2): 18 ug via RESPIRATORY_TRACT
  Filled 2015-01-14: qty 5

## 2015-01-14 MED ORDER — MORPHINE SULFATE 2 MG/ML IJ SOLN
2.0000 mg | INTRAMUSCULAR | Status: DC | PRN
  Administered 2015-01-14 – 2015-01-15 (×2): 2 mg via INTRAVENOUS
  Administered 2015-01-15: 4 mg via INTRAVENOUS
  Filled 2015-01-14 (×2): qty 1
  Filled 2015-01-14: qty 2

## 2015-01-14 MED ORDER — ALBUTEROL SULFATE HFA 108 (90 BASE) MCG/ACT IN AERS: 2.0000 | INHALATION_SPRAY | Freq: Four times a day (QID) | RESPIRATORY_TRACT | Status: DC | PRN

## 2015-01-14 MED ORDER — BUDESONIDE-FORMOTEROL FUMARATE 160-4.5 MCG/ACT IN AERO
2.0000 | INHALATION_SPRAY | Freq: Two times a day (BID) | RESPIRATORY_TRACT | Status: DC
  Administered 2015-01-15 – 2015-01-16 (×3): 2 via RESPIRATORY_TRACT
  Filled 2015-01-14: qty 6

## 2015-01-14 NOTE — ED Notes (Signed)
Patient transported to Ultrasound 

## 2015-01-14 NOTE — ED Notes (Signed)
NOTIFIED DR.HARPER FOR PATIENTS LAB RESULTS OF CG4+LACTIC ACID @17 :53PM ,01/14/2015.

## 2015-01-14 NOTE — ED Provider Notes (Signed)
CSN: [id number]     Arrival date & time 01/14/15  1558 History   First MD Initiated Contact with Patient 01/14/15 1639     Chief Complaint  Patient presents with  . Diarrhea     (Consider location/radiation/quality/duration/timing/severity/associated sxs/prior Treatment) HPI   This is a 49 year old female, with a history of fibromyalgia, CHF, multiple multiple admissions recently for shortness of breath, COPD, recent increase in LFTs, with initial concern for pneumobilia, which gastroenterology feels is likely not the case, and normal right upper quadrant ultrasound 20 days ago, presenting today with abdominal pain, nausea, vomiting, diarrhea. Onset yesterday morning at 0600 hrs. Located right upper quadrant, epigastric area. Persistent, throbbing, sharp. Unable to take medicine at this time. It radiates to her back. Vomitus is nonbloody, nonbilious. Negative for hematochezia or melena. Otherwise, patient denies chest pain, shortness of breath, lower extremity edema, or diaphoresis, although she does endorse chills.  Past Medical History  Diagnosis Date  . Hypertension   . Hyperlipidemia   . Chronic headache   . Fibromyalgia     daily narcotics  . Anxiety     hx chronic BZ use, stopped 07/2010  . Anemia   . Pulmonary sarcoidosis     unimpressive CT chest 2011  . Colonic polyp   . GERD (gastroesophageal reflux disease)   . ALLERGIC RHINITIS   . Asthma   . CHF (congestive heart failure)     Diastolic with fluid overload, May, 2012, LVEF 60%  . Morbid obesity   . Depression   . Panic attacks   . Diabetes mellitus   . COPD (chronic obstructive pulmonary disease)     on home O2, moderate airflow obstruction, suspect d/t emphysema  . Obesity   . Elevated LFTs 09/2011  . Ovarian cyst   . Chronic back pain   . Sleep apnea      CPAP  . Fatty liver   . Esophagitis   . Gastritis   . Internal hemorrhoids   . Adenomatous colon polyp 12/03/09    [facility] in [place], [place]    Past Surgical History  Procedure Laterality Date  . Polypectomy  2011  . Lumbar microdiscectomy  07/06/2011    R L4-5, stern  . Back surgery    . Carpal tunnel release    . Steroid spinal injections    . Hysteroscopy w/d&c N/A 03/25/2013    Procedure: DILATATION AND CURETTAGE /HYSTEROSCOPY;  Surgeon: [name], MD;  Location: [facility];  Service: Gynecology;  Laterality: N/A;  . Dilation and curettage of uterus     Family History  Problem Relation Age of Onset  . Hypertension Mother   . Heart disease Mother   . Emphysema Father   . Hypertension Father   . Stomach cancer Father   . Heart disease Father   . Allergies Brother   . Stomach cancer Brother   . Congestive Heart Failure Brother   . Diabetes type II Brother   . Heart disease Sister   . Diabetes type II Sister   . Heart attack Sister   . Stroke Sister   . Hypertension Sister   . Heart failure Sister     diastolic  . Hypertension Sister    History  Substance Use Topics  . Smoking status: Former Smoker -- 0.25 packs/day for 29 years    Types: Cigarettes    Quit date: 10/26/2014  . Smokeless tobacco: Never Used  . Alcohol Use: 0.0 oz/week    0 Standard drinks or  equivalent per week     Comment: occasionally   OB History    No data available     Review of Systems  Constitutional: Positive for chills.  HENT: Negative for facial swelling.   Eyes: Negative for photophobia and pain.  Respiratory: Negative for cough and shortness of breath.   Cardiovascular: Negative for chest pain and leg swelling.  Gastrointestinal: Positive for nausea, vomiting, abdominal pain and diarrhea.  Genitourinary: Negative for dysuria and hematuria.  Musculoskeletal: Negative for arthralgias.  Skin: Negative for rash and wound.  Neurological: Negative for seizures.  Hematological: Negative for adenopathy.      Allergies  Simvastatin; Sulfamethoxazole-trimethoprim; Zolpidem tartrate; Azithromycin; Ceftin; Doxycycline; Latex;  Metformin and related; Metolazone; Metronidazole; Ciprofloxacin; and Tramadol  Home Medications   Prior to Admission medications   Medication Sig Start Date End Date Taking? Authorizing Provider  acetaminophen (TYLENOL) 325 MG tablet Take 1,300 mg by mouth every 6 (six) hours as needed for moderate pain.     Historical Provider, MD  albuterol (PROVENTIL) (2.5 MG/3ML) 0.083% nebulizer solution Take 3 mLs (2.5 mg total) by nebulization every 6 (six) hours as needed for wheezing or shortness of breath. 12/08/14   Rowe Clack, MD  ALPRAZolam Duanne Moron) 1 MG tablet Take 0.5 tablets (0.5 mg total) by mouth 3 (three) times daily. 12/28/14   Shanker Kristeen Mans, MD  aspirin EC 81 MG tablet Take 81 mg by mouth daily.    Historical Provider, MD  BIOTIN PO Take 1 capsule by mouth daily.    Historical Provider, MD  cetirizine (ZYRTEC) 1 MG/ML syrup Take 10 mLs (10 mg total) by mouth at bedtime. 01/06/15   Rowe Clack, MD  Coenzyme Q10 (CO Q 10) 10 MG CAPS Take 1 capsule by mouth daily.    Historical Provider, MD  diclofenac sodium (VOLTAREN) 1 % GEL Apply 4 g topically 4 (four) times daily. 12/18/14   Debbe Odea, MD  esomeprazole (NEXIUM) 40 MG capsule Take 40 mg by mouth 2 (two) times daily before a meal.    Historical Provider, MD  fluticasone (FLONASE) 50 MCG/ACT nasal spray Place 2 sprays into both nostrils daily. 10/19/14   Rowe Clack, MD  gabapentin (NEURONTIN) 300 MG capsule Take 300 mg by mouth at bedtime.    Historical Provider, MD  glimepiride (AMARYL) 1 MG tablet Take 1 mg by mouth daily with breakfast.    Historical Provider, MD  glucose blood (ONE TOUCH ULTRA TEST) test strip Use to check blood sugars twice a day Dx E11.9 01/12/15   Rowe Clack, MD  hydrOXYzine (ATARAX/VISTARIL) 10 MG tablet TAKE 1 TABLET BY MOUTH THREE TIMES DAILY AS NEEDED FOR ITCHING 08/27/14   Biagio Borg, MD  ibuprofen (ADVIL,MOTRIN) 400 MG tablet Take 1 tablet (400 mg total) by mouth every 6 (six) hours  as needed for moderate pain. 12/18/14   Debbe Odea, MD  insulin aspart (NOVOLOG FLEXPEN) 100 UNIT/ML FlexPen Inject 5 Units into the skin 3 (three) times daily with meals. 12/28/14   Shanker Kristeen Mans, MD  Insulin Glargine (LANTUS SOLOSTAR) 100 UNIT/ML Solostar Pen Inject 40 Units into the skin 2 (two) times daily. 01/13/15   Rowe Clack, MD  metolazone (ZAROXOLYN) 2.5 MG tablet Take 1 tablet (2.5 mg total) by mouth once. Take 30 minutes before your torsemide tonight 01/11/15   Liliane Shi, PA-C  Multiple Vitamin (MULTIVITAMIN) capsule Take 1 capsule by mouth daily.    Historical Provider, MD  nystatin (MYCOSTATIN) 100000  UNITS/ML SUSP Take 1 mL by mouth 3 (three) times daily. Swish solution then swallow. 01/04/15   Rowe Clack, MD  omega-3 fish oil (MAXEPA) 1000 MG CAPS capsule Take 1 capsule by mouth daily.    Historical Provider, MD  oxyCODONE-acetaminophen (PERCOCET/ROXICET) 5-325 MG per tablet Take 1 tablet by mouth every 12 (twelve) hours as needed. 12/29/14   Historical Provider, MD  potassium chloride SA (K-DUR,KLOR-CON) 20 MEQ tablet Take 3 tablets (60 mEq total) by mouth 2 (two) times daily. 01/12/15   Scott T Weaver, PA-C  PROAIR HFA 108 (90 BASE) MCG/ACT inhaler INHALE 2 PUFFS BY MOUTH EVERY 6 HOURS AS NEEDED 08/28/14   Rowe Clack, MD  promethazine (PHENERGAN) 25 MG tablet Take 1 tablet (25 mg total) by mouth every 6 (six) hours as needed for nausea or vomiting. for nausea 01/11/15   Rowe Clack, MD  SPIRIVA HANDIHALER 18 MCG inhalation capsule INHALE THE CONTENTS OF 1 CAPSULE VIA INHALATION DEVICE EVERY DAY 01/14/15   Kathee Delton, MD  SYMBICORT 160-4.5 MCG/ACT inhaler INHALE 2 PUFFS BY MOUTH TWICE DAILY 10/19/14   Kathee Delton, MD  tiZANidine (ZANAFLEX) 4 MG tablet Take 1 tablet (4 mg total) by mouth every 8 (eight) hours as needed for muscle spasms. 12/28/14   Shanker Kristeen Mans, MD  torsemide (DEMADEX) 100 MG tablet Take 1 tablet (100 mg total) by mouth 2 (two)  times daily. 07/30/14   Biagio Borg, MD  varenicline (CHANTIX) 0.5 MG tablet Take 1 tablet (0.5 mg total) by mouth 2 (two) times daily. 11/03/14   Golden Circle, FNP  venlafaxine XR (EFFEXOR XR) 37.5 MG 24 hr capsule Take 1 capsule (37.5 mg total) by mouth daily with breakfast. 11/13/14   Rowe Clack, MD   BP 95/60 mmHg  Pulse 109  Temp(Src) 98 F (36.7 C) (Oral)  Resp 21  Wt 289 lb 4 oz (131.203 kg)  SpO2 96%  LMP 10/16/2012 Physical Exam  Constitutional: She is oriented to person, place, and time. She appears well-developed and well-nourished. No distress.  HENT:  Head: Normocephalic and atraumatic.  Mouth/Throat: No oropharyngeal exudate.  Eyes: Conjunctivae are normal. Pupils are equal, round, and reactive to light. No scleral icterus.  Neck: Normal range of motion. No tracheal deviation present. No thyromegaly present.  Cardiovascular: Normal rate, regular rhythm and normal heart sounds.  Exam reveals no gallop and no friction rub.   No murmur heard. Pulmonary/Chest: Effort normal and breath sounds normal. No stridor. No respiratory distress. She has no wheezes. She has no rales. She exhibits no tenderness.  Abdominal: Soft. She exhibits no distension and no mass. There is tenderness (RUQ, epigastric area). There is no rebound and no guarding.  Musculoskeletal: Normal range of motion. She exhibits no edema.  Neurological: She is alert and oriented to person, place, and time.  Skin: Skin is warm and dry. She is not diaphoretic.    ED Course  Procedures (including critical care time) Labs Review Labs Reviewed  CBC WITH DIFFERENTIAL/PLATELET - Abnormal; Notable for the following:    RBC 6.08 (*)    Hemoglobin 16.1 (*)    HCT 47.0 (*)    MCV 77.3 (*)    RDW 16.8 (*)    All other components within normal limits  COMPREHENSIVE METABOLIC PANEL - Abnormal; Notable for the following:    Sodium 130 (*)    Chloride 85 (*)    Glucose, Bld 228 (*)    Creatinine, Ser 1.49 (*)  Total Protein 8.5 (*)    AST 47 (*)    ALT 79 (*)    Total Bilirubin 1.8 (*)    GFR calc non Af Amer 41 (*)    GFR calc Af Amer 47 (*)    Anion gap 18 (*)    All other components within normal limits  I-STAT CG4 LACTIC ACID, ED - Abnormal; Notable for the following:    Lactic Acid, Venous 4.40 (*)    All other components within normal limits  I-STAT CG4 LACTIC ACID, ED - Abnormal; Notable for the following:    Lactic Acid, Venous 2.82 (*)    All other components within normal limits  CLOSTRIDIUM DIFFICILE BY PCR  LIPASE, BLOOD  URINALYSIS, ROUTINE W REFLEX MICROSCOPIC  MAGNESIUM  POC URINE PREG, ED  I-STAT CG4 LACTIC ACID, ED    Imaging Review Dg Chest 2 View  01/14/2015   CLINICAL DATA:  Abdominal pain and chest pain as well as nausea, vomiting and diarrhea beginning yesterday morning. Back spasms. Known pulmonary sarcoidosis.  EXAM: CHEST  2 VIEW  COMPARISON:  01/13/2015 and 10/18/2014  FINDINGS: Lungs are adequately inflated with stable interstitial changes over the mid to lower lungs. No focal consolidation or effusion. Borderline stable cardiomegaly. Mild prominence of the main pulmonary artery segment unchanged. Remainder of the exam is unchanged.  IMPRESSION: No active cardiopulmonary disease.  Stable interstitial disease likely related to patient's known pulmonary sarcoidosis. Stable prominence of the main pulmonary artery segment which may be seen with pulmonary arterial hypertension.   Electronically Signed   By: Marin Olp M.D.   On: 01/14/2015 18:50   Dg Chest 2 View  01/13/2015   CLINICAL DATA:  Shortness of breath. Follow-up pneumonia. History of COPD and CHF and sarcoid.  EXAM: CHEST  2 VIEW  COMPARISON:  12/21/2014 and 12/18/2014 and 07/22/2014  FINDINGS: Heart size is stable and within normal limits. Again noted are prominent central vascular structures. There are scattered linear densities throughout the lower lungs, right side greater the left. These lung findings  have not significantly changed. Upper lungs remain clear. No large areas of airspace disease or consolidation. No large pleural effusions. No acute bone abnormality.  IMPRESSION: Persistent patchy linear densities at the lung bases. There has been minimal change from the most recent comparison examinations. Suspect that most of these parenchymal densities are related to chronic changes. No large area of airspace disease.   Electronically Signed   By: Markus Daft M.D.   On: 01/13/2015 10:06   US Abdomen Limited Ruq  01/14/2015   CLINICAL DATA:  Abdominal pain.  EXAM: US ABDOMEN LIMITED - RIGHT UPPER QUADRANT  COMPARISON:  Ultrasound dated 12/25/2014  FINDINGS: Gallbladder:  No gallstones or wall thickening visualized. No sonographic Murphy sign noted.  Common bile duct:  Diameter: 4 mm, normal.  Liver:  Diffuse increased echogenicity consistent with hepatic steatosis. No focal lesions. Hepatomegaly.  IMPRESSION: Hepatic steatosis and hepatomegaly as demonstrated on the prior CT scan of the chest dated 12/21/2014.  No acute abnormalities.   Electronically Signed   By: Lorriane Shire M.D.   On: 01/14/2015 19:09     EKG Interpretation   Date/Time:  Thursday January 14 2015 17:09:26 EDT Ventricular Rate:  128 PR Interval:  112 QRS Duration: 89 QT Interval:  390 QTC Calculation: 569 R Axis:   77 Text Interpretation:  Sinus tachycardia Nonspecific T abnormalities,  inferior leads Prolonged QT interval anterior t wave abnormality resolved,  inferior t wave  abnormality now present Confirmed by [facility]  MD, TREY  [phone]) on 01/14/2015 6:01:35 PM       MDM   Final diagnoses:  Abdominal pain    This is a 49 year old female, with a history of fibromyalgia, CHF, multiple multiple admissions recently for shortness of breath, COPD, recent increase in LFTs, with initial concern for pneumobilia, which gastroenterology feels is likely not the case, and normal right upper quadrant ultrasound 20 days ago,  presenting today with abdominal pain, nausea, vomiting, diarrhea. Onset yesterday morning at 0600 hrs. Located right upper quadrant, epigastric area. Persistent, throbbing, sharp. Unable to take medicine at this time. It radiates to her back. Vomitus is nonbloody, nonbilious. Negative for hematochezia or melena. Otherwise, patient denies chest pain, shortness of breath, lower extremity edema, or diaphoresis, although she does endorse chills.  On initial evaluation, patient was tachycardic, with stable blood pressure. Cardiovascular and respiratory exams were within normal limits. Abdominal exam was positive for right upper quadrant, epigastric tenderness to palpation, without rigidity, rebound, or guarding. At that time, I was concerned for cholecystic etiology versus gastroenteritis. Fluids, pain medications were given, and antiemetics were given. Right upper quadrant ultrasound was within normal limits. Initial lactic acid was 4.4. Negative for AKI on labs. Negative for increase in LFTs.  Patient still cannot take PO. Vital signs have responded well to light fluid resuscitation. I suspect gastroenteritis at this time, likely viral in nature.  C diff has been sent.  At this time, considering complexity of patient's fluid status, complexity of patient's medical history, need for daily medications, I do not believe that the patient is stable for discharge at this time. We'll admit to hospitalist service, continue supportive care, evaluation.  I have discussed case and care has been guided by my attending physician, Dr. Doy Mince.  Doy Hutching, MD 01/15/15 Lynnell Catalan  Serita Grit, MD 01/15/15 [phone]

## 2015-01-14 NOTE — H&P (Signed)
[facility] History and Physical  [name] WNU:[id number] DOB: 03/02/1966 DOA: 01/14/2015  Referring physician: EDP PCP: Gwendolyn Grant, MD   Chief Complaint: N/V/D   HPI: [name] is a 49 y.o. female who presents to the ED with c/o N/V/D for the past 24 hours.  She has not been able to keep anything down and is having dry heaves.  Had 1 episode of vomiting in ED.  She believes it is due to her cardiologist giving her zaroxylin which she is allergic to she says.  She had 1 dose of zaroxylin 2 days ago, and woke up yesterday with the N/V.  She denies any other substance use including THC or Cocaine since February.  She denies withdrawing from her home opioids and still has these at home.  Review of Systems: Systems reviewed.  As above, otherwise negative  Past Medical History  Diagnosis Date  . Hypertension   . Hyperlipidemia   . Chronic headache   . Fibromyalgia     daily narcotics  . Anxiety     hx chronic BZ use, stopped 07/2010  . Anemia   . Pulmonary sarcoidosis     unimpressive CT chest 2011  . Colonic polyp   . GERD (gastroesophageal reflux disease)   . ALLERGIC RHINITIS   . Asthma   . CHF (congestive heart failure)     Diastolic with fluid overload, May, 2012, LVEF 60%  . Morbid obesity   . Depression   . Panic attacks   . Diabetes mellitus   . COPD (chronic obstructive pulmonary disease)     on home O2, moderate airflow obstruction, suspect d/t emphysema  . Obesity   . Elevated LFTs 09/2011  . Ovarian cyst   . Chronic back pain   . Sleep apnea      CPAP  . Fatty liver   . Esophagitis   . Gastritis   . Internal hemorrhoids   . Adenomatous colon polyp 12/03/09    [facility] in [place], [place]   Past Surgical History  Procedure Laterality Date  . Polypectomy  2011  . Lumbar microdiscectomy  07/06/2011    R L4-5, stern  . Back surgery    . Carpal tunnel release    . Steroid spinal injections    . Hysteroscopy w/d&c N/A  03/25/2013    Procedure: DILATATION AND CURETTAGE /HYSTEROSCOPY;  Surgeon: [name], MD;  Location: [facility];  Service: Gynecology;  Laterality: N/A;  . Dilation and curettage of uterus     Social History:  reports that she quit smoking about 2 months ago. Her smoking use included Cigarettes. She has a 7.25 pack-year smoking history. She has never used smokeless tobacco. She reports that she drinks alcohol. She reports that she uses illicit drugs (Cocaine and Marijuana).  Allergies  Allergen Reactions  . Simvastatin Other (See Comments)    Severe leg pain and burning  . Sulfamethoxazole-Trimethoprim Nausea And Vomiting    Causes Projectile Vomiting  . Zolpidem Tartrate Other (See Comments)    Chest pain  . Azithromycin Other (See Comments)    Resistant to med  . Ceftin [Cefuroxime] Swelling and Other (See Comments)    MOUTH SWELLING  . Doxycycline Other (See Comments)    Resistant to med  . Latex Itching and Rash    Also burning sensations  . Metformin And Related Diarrhea    Severe diarrhea  . Metolazone Other (See Comments)    MYALGIAS  . Metronidazole Nausea And Vomiting  .  Ciprofloxacin Nausea Only  . Tramadol Itching and Nausea Only    Family History  Problem Relation Age of Onset  . Hypertension Mother   . Heart disease Mother   . Emphysema Father   . Hypertension Father   . Stomach cancer Father   . Heart disease Father   . Allergies Brother   . Stomach cancer Brother   . Congestive Heart Failure Brother   . Diabetes type II Brother   . Heart disease Sister   . Diabetes type II Sister   . Heart attack Sister   . Stroke Sister   . Hypertension Sister   . Heart failure Sister     diastolic  . Hypertension Sister      Prior to Admission medications   Medication Sig Start Date End Date Taking? Authorizing Provider  acetaminophen (TYLENOL) 325 MG tablet Take 1,300 mg by mouth every 6 (six) hours as needed for moderate pain.     Historical Provider, MD   albuterol (PROVENTIL) (2.5 MG/3ML) 0.083% nebulizer solution Take 3 mLs (2.5 mg total) by nebulization every 6 (six) hours as needed for wheezing or shortness of breath. 12/08/14   Rowe Clack, MD  ALPRAZolam Duanne Moron) 1 MG tablet Take 0.5 tablets (0.5 mg total) by mouth 3 (three) times daily. 12/28/14   Shanker Kristeen Mans, MD  aspirin EC 81 MG tablet Take 81 mg by mouth daily.    Historical Provider, MD  BIOTIN PO Take 1 capsule by mouth daily.    Historical Provider, MD  cetirizine (ZYRTEC) 1 MG/ML syrup Take 10 mLs (10 mg total) by mouth at bedtime. 01/06/15   Rowe Clack, MD  Coenzyme Q10 (CO Q 10) 10 MG CAPS Take 1 capsule by mouth daily.    Historical Provider, MD  diclofenac sodium (VOLTAREN) 1 % GEL Apply 4 g topically 4 (four) times daily. 12/18/14   Debbe Odea, MD  esomeprazole (NEXIUM) 40 MG capsule Take 40 mg by mouth 2 (two) times daily before a meal.    Historical Provider, MD  fluticasone (FLONASE) 50 MCG/ACT nasal spray Place 2 sprays into both nostrils daily. 10/19/14   Rowe Clack, MD  gabapentin (NEURONTIN) 300 MG capsule Take 300 mg by mouth at bedtime.    Historical Provider, MD  glimepiride (AMARYL) 1 MG tablet Take 1 mg by mouth daily with breakfast.    Historical Provider, MD  glucose blood (ONE TOUCH ULTRA TEST) test strip Use to check blood sugars twice a day Dx E11.9 01/12/15   Rowe Clack, MD  hydrOXYzine (ATARAX/VISTARIL) 10 MG tablet TAKE 1 TABLET BY MOUTH THREE TIMES DAILY AS NEEDED FOR ITCHING 08/27/14   Biagio Borg, MD  ibuprofen (ADVIL,MOTRIN) 400 MG tablet Take 1 tablet (400 mg total) by mouth every 6 (six) hours as needed for moderate pain. 12/18/14   Debbe Odea, MD  insulin aspart (NOVOLOG FLEXPEN) 100 UNIT/ML FlexPen Inject 5 Units into the skin 3 (three) times daily with meals. 12/28/14   Shanker Kristeen Mans, MD  Insulin Glargine (LANTUS SOLOSTAR) 100 UNIT/ML Solostar Pen Inject 40 Units into the skin 2 (two) times daily. 01/13/15   Rowe Clack, MD  Multiple Vitamin (MULTIVITAMIN) capsule Take 1 capsule by mouth daily.    Historical Provider, MD  nystatin (MYCOSTATIN) 100000 UNITS/ML SUSP Take 1 mL by mouth 3 (three) times daily. Swish solution then swallow. 01/04/15   Rowe Clack, MD  omega-3 fish oil (MAXEPA) 1000 MG CAPS capsule Take 1  capsule by mouth daily.    Historical Provider, MD  oxyCODONE-acetaminophen (PERCOCET/ROXICET) 5-325 MG per tablet Take 1 tablet by mouth every 12 (twelve) hours as needed. 12/29/14   Historical Provider, MD  potassium chloride SA (K-DUR,KLOR-CON) 20 MEQ tablet Take 3 tablets (60 mEq total) by mouth 2 (two) times daily. 01/12/15   Scott T Weaver, PA-C  PROAIR HFA 108 (90 BASE) MCG/ACT inhaler INHALE 2 PUFFS BY MOUTH EVERY 6 HOURS AS NEEDED 08/28/14   Rowe Clack, MD  promethazine (PHENERGAN) 25 MG tablet Take 1 tablet (25 mg total) by mouth every 6 (six) hours as needed for nausea or vomiting. for nausea 01/11/15   Rowe Clack, MD  SPIRIVA HANDIHALER 18 MCG inhalation capsule INHALE THE CONTENTS OF 1 CAPSULE VIA INHALATION DEVICE EVERY DAY 01/14/15   Kathee Delton, MD  SYMBICORT 160-4.5 MCG/ACT inhaler INHALE 2 PUFFS BY MOUTH TWICE DAILY 10/19/14   Kathee Delton, MD  tiZANidine (ZANAFLEX) 4 MG tablet Take 1 tablet (4 mg total) by mouth every 8 (eight) hours as needed for muscle spasms. 12/28/14   Shanker Kristeen Mans, MD  torsemide (DEMADEX) 100 MG tablet Take 1 tablet (100 mg total) by mouth 2 (two) times daily. 07/30/14   Biagio Borg, MD  varenicline (CHANTIX) 0.5 MG tablet Take 1 tablet (0.5 mg total) by mouth 2 (two) times daily. 11/03/14   Golden Circle, FNP  venlafaxine XR (EFFEXOR XR) 37.5 MG 24 hr capsule Take 1 capsule (37.5 mg total) by mouth daily with breakfast. 11/13/14   Rowe Clack, MD   Physical Exam: Filed Vitals:   01/14/15 2100  BP: 95/80  Pulse: 106  Temp:   Resp: 15    BP 95/80 mmHg  Pulse 106  Temp(Src) 98.7 F (37.1 C) (Rectal)  Resp 15  Wt  131.203 kg (289 lb 4 oz)  SpO2 96%  LMP 10/16/2012  General Appearance:    Alert, oriented, no distress, appears stated age  Head:    Normocephalic, atraumatic  Eyes:    PERRL, EOMI, sclera non-icteric        Nose:   Nares without drainage or epistaxis. Mucosa, turbinates normal  Throat:   Moist mucous membranes. Oropharynx without erythema or exudate.  Neck:   Supple. No carotid bruits.  No thyromegaly.  No lymphadenopathy.   Back:     No CVA tenderness, no spinal tenderness  Lungs:     Clear to auscultation bilaterally, without wheezes, rhonchi or rales  Chest wall:    No tenderness to palpitation  Heart:    Regular rate and rhythm without murmurs, gallops, rubs  Abdomen:     Soft, non-tender, nondistended, normal bowel sounds, no organomegaly  Genitalia:    deferred  Rectal:    deferred  Extremities:   No clubbing, cyanosis or edema.  Pulses:   2+ and symmetric all extremities  Skin:   Skin color, texture, turgor normal, no rashes or lesions  Lymph nodes:   Cervical, supraclavicular, and axillary nodes normal  Neurologic:   CNII-XII intact. Normal strength, sensation and reflexes      throughout    Labs on Admission:  Basic Metabolic Panel:  Recent Labs Lab 01/11/15 1559 01/13/15 0945 01/14/15 1616  NA 134* 129*  129* 130*  K 3.2* 4.4  4.4 3.7  CL 91* 83*  83* 85*  CO2 34* 31  31 27   GLUCOSE 257* 372*  372* 228*  BUN 11 19  19 20   CREATININE  0.88 1.43*  1.43* 1.49*  CALCIUM 8.9 12.1*  12.1* 10.3   Liver Function Tests:  Recent Labs Lab 01/13/15 0945 01/14/15 1616  AST 33  33 47*  ALT 90*  90* 79*  ALKPHOS 144*  144* 109  BILITOT 1.2  1.2 1.8*  PROT 8.9*  8.9* 8.5*  ALBUMIN 4.6  4.6 4.2    Recent Labs Lab 01/14/15 1616  LIPASE 44   No results for input(s): AMMONIA in the last 168 hours. CBC:  Recent Labs Lab 01/13/15 0945 01/14/15 1616  WBC 8.9 8.0  NEUTROABS 6.1 5.5  HGB 15.8* 16.1*  HCT 47.4* 47.0*  MCV 76.6* 77.3*  PLT 210.0  190   Cardiac Enzymes: No results for input(s): CKTOTAL, CKMB, CKMBINDEX, TROPONINI in the last 168 hours.  BNP (last 3 results)  Recent Labs  02/11/14 0021 04/17/14 0120 07/20/14 1817  PROBNP 32.8 44.7 39.7   CBG: No results for input(s): GLUCAP in the last 168 hours.  Radiological Exams on Admission: Dg Chest 2 View  01/14/2015   CLINICAL DATA:  Abdominal pain and chest pain as well as nausea, vomiting and diarrhea beginning yesterday morning. Back spasms. Known pulmonary sarcoidosis.  EXAM: CHEST  2 VIEW  COMPARISON:  01/13/2015 and 10/18/2014  FINDINGS: Lungs are adequately inflated with stable interstitial changes over the mid to lower lungs. No focal consolidation or effusion. Borderline stable cardiomegaly. Mild prominence of the main pulmonary artery segment unchanged. Remainder of the exam is unchanged.  IMPRESSION: No active cardiopulmonary disease.  Stable interstitial disease likely related to patient's known pulmonary sarcoidosis. Stable prominence of the main pulmonary artery segment which may be seen with pulmonary arterial hypertension.   Electronically Signed   By: Marin Olp M.D.   On: 01/14/2015 18:50   Dg Chest 2 View  01/13/2015   CLINICAL DATA:  Shortness of breath. Follow-up pneumonia. History of COPD and CHF and sarcoid.  EXAM: CHEST  2 VIEW  COMPARISON:  12/21/2014 and 12/18/2014 and 07/22/2014  FINDINGS: Heart size is stable and within normal limits. Again noted are prominent central vascular structures. There are scattered linear densities throughout the lower lungs, right side greater the left. These lung findings have not significantly changed. Upper lungs remain clear. No large areas of airspace disease or consolidation. No large pleural effusions. No acute bone abnormality.  IMPRESSION: Persistent patchy linear densities at the lung bases. There has been minimal change from the most recent comparison examinations. Suspect that most of these parenchymal  densities are related to chronic changes. No large area of airspace disease.   Electronically Signed   By: Markus Daft M.D.   On: 01/13/2015 10:06   US Abdomen Limited Ruq  01/14/2015   CLINICAL DATA:  Abdominal pain.  EXAM: US ABDOMEN LIMITED - RIGHT UPPER QUADRANT  COMPARISON:  Ultrasound dated 12/25/2014  FINDINGS: Gallbladder:  No gallstones or wall thickening visualized. No sonographic Murphy sign noted.  Common bile duct:  Diameter: 4 mm, normal.  Liver:  Diffuse increased echogenicity consistent with hepatic steatosis. No focal lesions. Hepatomegaly.  IMPRESSION: Hepatic steatosis and hepatomegaly as demonstrated on the prior CT scan of the chest dated 12/21/2014.  No acute abnormalities.   Electronically Signed   By: Lorriane Shire M.D.   On: 01/14/2015 19:09    EKG: Independently reviewed.  Assessment/Plan Principal Problem:   Nausea vomiting and diarrhea Active Problems:   Diabetes type 2, uncontrolled   Chronic diastolic CHF (congestive heart failure)   AKI (acute kidney  injury)   1. N/V/D - 1. Symptom control with morphine and phenergan prn 2. IVF at 75 cc/hr 2. AKI - 1. IVF as above 2. Repeat CMP in AM 3. Chronic diastolic CHF - 1. Hold diuretics 2. Monitor IVF closely 4. DM2 - 1. Reduce home lantus from 40 BID to 30 BID 2. Add mod dose SSI Q4H    Code Status: Full Code  Family Communication: No family in room Disposition Plan: Admit to obs   Time spent: 70 min  [name] M. [facility] Pager [phone]  If 7AM-7PM, please contact the day team taking care of the patient [email or web address] Password [facility] 01/14/2015, 9:25 PM

## 2015-01-14 NOTE — ED Notes (Signed)
Pt reports that she has had multiple episodes of diarrhea and nausea over the past couple of days. States she was recently in the hospital for PNA and her sister has also been sick. Pt wears 3-4L [place] oxygen at all times.

## 2015-01-14 NOTE — ED Provider Notes (Signed)
I saw and evaluated the patient, reviewed the resident's note and I agree with the findings and plan.   EKG Interpretation   Date/Time:  Thursday January 14 2015 17:09:26 EDT Ventricular Rate:  128 PR Interval:  112 QRS Duration: 89 QT Interval:  390 QTC Calculation: 569 R Axis:   77 Text Interpretation:  Sinus tachycardia Nonspecific T abnormalities,  inferior leads Prolonged QT interval anterior t wave abnormality resolved,  inferior t wave abnormality now present Confirmed by [facility]  MD, TREY  ([id number]) on 01/14/2015 6:01:35 PM      49 yo female presenting with several days of abdominal pain, vomiting, diarrhea.  On exam, ill appearing, but nontoxic, not distressed, normal respiratory effort, normal perfusion, abdomen soft but tender in RUQ.  Lactate elevated, so empiric antibiotics initiated.  Given fluid resuscitation, which helped.  [place] of RUQ negative.  Plan admit.  Clinical Impression: 1. Abdominal pain   2. Intractable vomiting       Serita Grit, MD 01/15/15 [phone]

## 2015-01-14 NOTE — Progress Notes (Signed)
01/14/2015 10:24 PM  Pt arrived to the unit via bed from the ED. Pt alert and oriented to room, staff and call bell. Pt put on 4 liters of oxygen. No signs or symptoms of distress. Full assessment to be placed in Epic. Will continue to monitor.   Ermalinda Memos, Chief Strategy Officer  Phone [phone]

## 2015-01-14 NOTE — ED Notes (Addendum)
Pt presents to the ED C/O abd pain as well as N/V and diarrhea that began yesterday morning at 0600. Pt also reports "spasms" in back as well as abdomen. Pt is distress and anxious.

## 2015-01-15 ENCOUNTER — Telehealth: Payer: Self-pay | Admitting: Physician Assistant

## 2015-01-15 DIAGNOSIS — J9620 Acute and chronic respiratory failure, unspecified whether with hypoxia or hypercapnia: Secondary | ICD-10-CM | POA: Diagnosis not present

## 2015-01-15 DIAGNOSIS — R112 Nausea with vomiting, unspecified: Secondary | ICD-10-CM | POA: Diagnosis not present

## 2015-01-15 DIAGNOSIS — N179 Acute kidney failure, unspecified: Secondary | ICD-10-CM | POA: Diagnosis not present

## 2015-01-15 DIAGNOSIS — R197 Diarrhea, unspecified: Secondary | ICD-10-CM | POA: Diagnosis not present

## 2015-01-15 DIAGNOSIS — R1013 Epigastric pain: Secondary | ICD-10-CM | POA: Diagnosis not present

## 2015-01-15 DIAGNOSIS — I5032 Chronic diastolic (congestive) heart failure: Secondary | ICD-10-CM | POA: Diagnosis not present

## 2015-01-15 DIAGNOSIS — E1165 Type 2 diabetes mellitus with hyperglycemia: Secondary | ICD-10-CM | POA: Diagnosis not present

## 2015-01-15 LAB — MRSA PCR SCREENING: MRSA BY PCR: NEGATIVE

## 2015-01-15 LAB — CBC
HEMATOCRIT: 40.1 % (ref 36.0–46.0)
Hemoglobin: 13.2 g/dL (ref 12.0–15.0)
MCH: 26.1 pg (ref 26.0–34.0)
MCHC: 32.9 g/dL (ref 30.0–36.0)
MCV: 79.2 fL (ref 78.0–100.0)
PLATELETS: 157 10*3/uL (ref 150–400)
RBC: 5.06 MIL/uL (ref 3.87–5.11)
RDW: 16.9 % — ABNORMAL HIGH (ref 11.5–15.5)
WBC: 6.2 10*3/uL (ref 4.0–10.5)

## 2015-01-15 LAB — COMPREHENSIVE METABOLIC PANEL
ALBUMIN: 3.3 g/dL — AB (ref 3.5–5.2)
ALT: 59 U/L — ABNORMAL HIGH (ref 0–35)
AST: 32 U/L (ref 0–37)
Alkaline Phosphatase: 88 U/L (ref 39–117)
Anion gap: 10 (ref 5–15)
BUN: 18 mg/dL (ref 6–23)
CO2: 33 mmol/L — ABNORMAL HIGH (ref 19–32)
Calcium: 8.8 mg/dL (ref 8.4–10.5)
Chloride: 90 mmol/L — ABNORMAL LOW (ref 96–112)
Creatinine, Ser: 1.29 mg/dL — ABNORMAL HIGH (ref 0.50–1.10)
GFR calc Af Amer: 56 mL/min — ABNORMAL LOW (ref >90)
GFR calc non Af Amer: 49 mL/min — ABNORMAL LOW (ref >90)
Glucose, Bld: 194 mg/dL — ABNORMAL HIGH (ref 70–99)
Potassium: 3.3 mmol/L — ABNORMAL LOW (ref 3.5–5.1)
SODIUM: 133 mmol/L — AB (ref 135–145)
TOTAL PROTEIN: 6.8 g/dL (ref 6.0–8.3)
Total Bilirubin: 1.5 mg/dL — ABNORMAL HIGH (ref 0.3–1.2)

## 2015-01-15 LAB — GLUCOSE, CAPILLARY
GLUCOSE-CAPILLARY: 194 mg/dL — AB (ref 70–99)
GLUCOSE-CAPILLARY: 278 mg/dL — AB (ref 70–99)
Glucose-Capillary: 177 mg/dL — ABNORMAL HIGH (ref 70–99)
Glucose-Capillary: 217 mg/dL — ABNORMAL HIGH (ref 70–99)
Glucose-Capillary: 205 mg/dL — ABNORMAL HIGH (ref 70–99)

## 2015-01-15 MED ORDER — MORPHINE SULFATE 2 MG/ML IJ SOLN
1.0000 mg | INTRAMUSCULAR | Status: DC | PRN
  Administered 2015-01-15 – 2015-01-16 (×4): 1 mg via INTRAVENOUS
  Filled 2015-01-15 (×4): qty 1

## 2015-01-15 MED ORDER — CETYLPYRIDINIUM CHLORIDE 0.05 % MT LIQD
7.0000 mL | Freq: Two times a day (BID) | OROMUCOSAL | Status: DC
  Administered 2015-01-15 – 2015-01-16 (×4): 7 mL via OROMUCOSAL

## 2015-01-15 MED ORDER — CHLORHEXIDINE GLUCONATE 0.12 % MT SOLN
15.0000 mL | Freq: Two times a day (BID) | OROMUCOSAL | Status: DC
  Administered 2015-01-15 – 2015-01-16 (×4): 15 mL via OROMUCOSAL
  Filled 2015-01-15 (×7): qty 15

## 2015-01-15 NOTE — Telephone Encounter (Signed)
Reviewed with Dr. Cleatis Polka. We feel she would be well served in the CHF clinic. Please refer patient to CHF clinic for diastolic and R sided heart failure, pulmonary HTN. Richardson Dopp, PA-C   01/15/2015 2:44 PM

## 2015-01-15 NOTE — Progress Notes (Signed)
RT went to check on pt. cpap status. Pt. Already wearing cpap. Tolerating well.

## 2015-01-15 NOTE — Progress Notes (Signed)
[facility] PROGRESS NOTE  ARCHANA ECKMAN ZOX:[id number] DOB: 12/15/1965 DOA: 01/14/2015 PCP: Gwendolyn Grant, MD  Assessment/Plan: 1. Nausea/vomiting/diarrhea -improving, likely gastroenteritis -check Cdiff CPR supportive care, advance diet as tolerated  2. DM -continue lantus, SSI -hbaic 9.4 in march  3. AKi on CKD -due to 1, diuretics held -on gentle IVF, resume tomorrow -bmet in am  4. Chronic diastolic CHF -stable, diuretics on hold  5. chronic reps failure-Home O2 -from COPD and sarcoidosis -stable, continue spiriva, symbicort  6. Anxiety  7. Chronic back pain  Code Status: Full Code Family Communication: none at bedside Disposition Plan: home tomorrow if stable    HPI/Subjective: Improving, diarrhea better, vomited this am  Objective: Filed Vitals:   01/15/15 0905  BP: 151/80  Pulse: 91  Temp: 97.7 F (36.5 C)  Resp: 18    Intake/Output Summary (Last 24 hours) at 01/15/15 1424 Last data filed at 01/15/15 1210  Gross per 24 hour  Intake   1650 ml  Output    300 ml  Net   1350 ml   Filed Weights   01/14/15 1605 01/14/15 2232  Weight: 131.203 kg (289 lb 4 oz) 133.539 kg (294 lb 6.4 oz)    Exam:   General:  AAOx3, no distress  Cardiovascular: S!S2/RRR  Respiratory: distant breath sounds  Abdomen: soft, Nt, BS present  Musculoskeletal: trace edema    Data Reviewed: Basic Metabolic Panel:  Recent Labs Lab 01/11/15 1559 01/13/15 0945 01/14/15 1616 01/15/15 0622  NA 134* 129*  129* 130* 133*  K 3.2* 4.4  4.4 3.7 3.3*  CL 91* 83*  83* 85* 90*  CO2 34* 31  31 27  33*  GLUCOSE 257* 372*  372* 228* 194*  BUN 11 19  19 20 18   CREATININE 0.88 1.43*  1.43* 1.49* 1.29*  CALCIUM 8.9 12.1*  12.1* 10.3 8.8  MG  --   --  1.4*  --    Liver Function Tests:  Recent Labs Lab 01/13/15 0945 01/14/15 1616 01/15/15 0622  AST 33  33 47* 32  ALT 90*  90* 79* 59*  ALKPHOS 144*  144* 109 88  BILITOT 1.2  1.2 1.8* 1.5*   PROT 8.9*  8.9* 8.5* 6.8  ALBUMIN 4.6  4.6 4.2 3.3*    Recent Labs Lab 01/14/15 1616  LIPASE 44   No results for input(s): AMMONIA in the last 168 hours. CBC:  Recent Labs Lab 01/13/15 0945 01/14/15 1616 01/15/15 0622  WBC 8.9 8.0 6.2  NEUTROABS 6.1 5.5  --   HGB 15.8* 16.1* 13.2  HCT 47.4* 47.0* 40.1  MCV 76.6* 77.3* 79.2  PLT 210.0 190 157   Cardiac Enzymes: No results for input(s): CKTOTAL, CKMB, CKMBINDEX, TROPONINI in the last 168 hours. BNP (last 3 results)  Recent Labs  12/09/14 0330 12/13/14 0820 12/18/14 1846  BNP 31.0 29.7 231.0*    ProBNP (last 3 results)  Recent Labs  02/11/14 0021 04/17/14 0120 07/20/14 1817  PROBNP 32.8 44.7 39.7    CBG:  Recent Labs Lab 01/14/15 2145 01/14/15 2226 01/15/15 0357 01/15/15 0757 01/15/15 1144  GLUCAP 249* 247* 177* 194* 217*    No results found for this or any previous visit (from the past 240 hour(s)).   Studies: Dg Chest 2 View  01/14/2015   CLINICAL DATA:  Abdominal pain and chest pain as well as nausea, vomiting and diarrhea beginning yesterday morning. Back spasms. Known pulmonary sarcoidosis.  EXAM: CHEST  2 VIEW  COMPARISON:  01/13/2015 and  10/18/2014  FINDINGS: Lungs are adequately inflated with stable interstitial changes over the mid to lower lungs. No focal consolidation or effusion. Borderline stable cardiomegaly. Mild prominence of the main pulmonary artery segment unchanged. Remainder of the exam is unchanged.  IMPRESSION: No active cardiopulmonary disease.  Stable interstitial disease likely related to patient's known pulmonary sarcoidosis. Stable prominence of the main pulmonary artery segment which may be seen with pulmonary arterial hypertension.   Electronically Signed   By: Marin Olp M.D.   On: 01/14/2015 18:50   US Abdomen Limited Ruq  01/14/2015   CLINICAL DATA:  Abdominal pain.  EXAM: US ABDOMEN LIMITED - RIGHT UPPER QUADRANT  COMPARISON:  Ultrasound dated 12/25/2014  FINDINGS:  Gallbladder:  No gallstones or wall thickening visualized. No sonographic Murphy sign noted.  Common bile duct:  Diameter: 4 mm, normal.  Liver:  Diffuse increased echogenicity consistent with hepatic steatosis. No focal lesions. Hepatomegaly.  IMPRESSION: Hepatic steatosis and hepatomegaly as demonstrated on the prior CT scan of the chest dated 12/21/2014.  No acute abnormalities.   Electronically Signed   By: Lorriane Shire M.D.   On: 01/14/2015 19:09    Scheduled Meds: . ALPRAZolam  0.5 mg Oral TID  . antiseptic oral rinse  7 mL Mouth Rinse q12n4p  . aspirin EC  81 mg Oral Daily  . budesonide-formoterol  2 puff Inhalation BID  . chlorhexidine  15 mL Mouth Rinse BID  . fluticasone  2 spray Each Nare Daily  . gabapentin  300 mg Oral QHS  . insulin aspart  0-15 Units Subcutaneous 6 times per day  . insulin glargine  30 Units Subcutaneous BID  . pantoprazole  80 mg Oral BID AC  . tiotropium  18 mcg Inhalation Daily  . varenicline  0.5 mg Oral BID  . venlafaxine XR  37.5 mg Oral Q breakfast   Continuous Infusions: . sodium chloride 75 mL/hr at 01/14/15 2353   Antibiotics Given (last 72 hours)    None      Principal Problem:   Nausea vomiting and diarrhea Active Problems:   Diabetes type 2, uncontrolled   Chronic diastolic CHF (congestive heart failure)   AKI (acute kidney injury)    Time spent: 35min    Alizabeth Antonio  [facility] Pager [phone]. If 7PM-7AM, please contact night-coverage at [email or web address], password [email or web address] 01/15/2015, 2:24 PM

## 2015-01-15 NOTE — Progress Notes (Addendum)
Inpatient Diabetes Program Recommendations  AACE/ADA: New Consensus Statement on Inpatient Glycemic Control (2013)  Target Ranges:  Prepandial:   less than 140 mg/dL      Peak postprandial:   less than 180 mg/dL (1-2 hours)      Critically ill patients:  140 - 180 mg/dL   Reason for Assessment: Hyperglycemia.  A1C from March 9.4  Diabetes history: Diagnosed three years ago Outpatient Diabetes medications: Lantus 40 units bid, Novolog 5 units tid, Amaryl 1 mg daily Current orders for Inpatient glycemic control: Lantus 30 units bid, Novolog moderate correction every 4 hours  Note:  Patient states she has never had formal diabetes education.  "Everything I know I learned from my sister who has diabetes."  Patient very receptive to a referral for diabetes education follow-up [facility].  Will place referral for MD to co-sign.  Thank you.  Terin Dierolf S. Marcelline Mates, RN, CNS, CDE Inpatient Diabetes Program, team pager [phone]  Addendum: Gave patient a leaflet explaining implications of X0X and instructions regarding accessing the Patient Education Network so she can view instructional videos regarding diabetes.  Yahaira Bruski S. Marcelline Mates, RN, CNS, CDE Inpatient Diabetes Program, team pager [phone]

## 2015-01-15 NOTE — Progress Notes (Signed)
Placed pt. On auto NIV, with full face mask. Pt. Tolerating well at this time. Pt. Has 4L oxygen titrated into the unit.

## 2015-01-15 NOTE — Telephone Encounter (Signed)
Scott--she was admitted 01/14/15 with N/V and is still an inpatient today.

## 2015-01-15 NOTE — Progress Notes (Signed)
UR completed 

## 2015-01-16 DIAGNOSIS — J9620 Acute and chronic respiratory failure, unspecified whether with hypoxia or hypercapnia: Secondary | ICD-10-CM | POA: Insufficient documentation

## 2015-01-16 DIAGNOSIS — R197 Diarrhea, unspecified: Secondary | ICD-10-CM | POA: Diagnosis not present

## 2015-01-16 DIAGNOSIS — R1013 Epigastric pain: Secondary | ICD-10-CM | POA: Diagnosis not present

## 2015-01-16 DIAGNOSIS — R109 Unspecified abdominal pain: Secondary | ICD-10-CM | POA: Insufficient documentation

## 2015-01-16 DIAGNOSIS — R112 Nausea with vomiting, unspecified: Secondary | ICD-10-CM | POA: Diagnosis not present

## 2015-01-16 DIAGNOSIS — E1165 Type 2 diabetes mellitus with hyperglycemia: Secondary | ICD-10-CM | POA: Diagnosis not present

## 2015-01-16 DIAGNOSIS — N179 Acute kidney failure, unspecified: Secondary | ICD-10-CM | POA: Diagnosis not present

## 2015-01-16 LAB — CBC
HEMATOCRIT: 39.3 % (ref 36.0–46.0)
Hemoglobin: 12.5 g/dL (ref 12.0–15.0)
MCH: 25.8 pg — AB (ref 26.0–34.0)
MCHC: 31.8 g/dL (ref 30.0–36.0)
MCV: 81.2 fL (ref 78.0–100.0)
Platelets: 126 10*3/uL — ABNORMAL LOW (ref 150–400)
RBC: 4.84 MIL/uL (ref 3.87–5.11)
RDW: 16.9 % — ABNORMAL HIGH (ref 11.5–15.5)
WBC: 3.9 10*3/uL — AB (ref 4.0–10.5)

## 2015-01-16 LAB — BASIC METABOLIC PANEL
Anion gap: 8 (ref 5–15)
BUN: 7 mg/dL (ref 6–23)
CALCIUM: 8.9 mg/dL (ref 8.4–10.5)
CO2: 31 mmol/L (ref 19–32)
Chloride: 96 mmol/L (ref 96–112)
Creatinine, Ser: 0.98 mg/dL (ref 0.50–1.10)
GFR calc Af Amer: 78 mL/min — ABNORMAL LOW (ref >90)
GFR calc non Af Amer: 68 mL/min — ABNORMAL LOW (ref >90)
GLUCOSE: 130 mg/dL — AB (ref 70–99)
POTASSIUM: 3.4 mmol/L — AB (ref 3.5–5.1)
Sodium: 135 mmol/L (ref 135–145)

## 2015-01-16 LAB — GLUCOSE, CAPILLARY
GLUCOSE-CAPILLARY: 139 mg/dL — AB (ref 70–99)
GLUCOSE-CAPILLARY: 152 mg/dL — AB (ref 70–99)
Glucose-Capillary: 121 mg/dL — ABNORMAL HIGH (ref 70–99)
Glucose-Capillary: 191 mg/dL — ABNORMAL HIGH (ref 70–99)
Glucose-Capillary: 218 mg/dL — ABNORMAL HIGH (ref 70–99)

## 2015-01-16 MED ORDER — OXYCODONE-ACETAMINOPHEN 5-325 MG PO TABS: 1.0000 | ORAL_TABLET | Freq: Two times a day (BID) | ORAL | Status: DC | PRN

## 2015-01-16 NOTE — Progress Notes (Signed)
01/16/2015 2:54 AM  Patient requested for a home medication, Voltaren 1% gel, for knee and back pain. Notified Rogue Bussing about request and he said that he will consult attending before prescribing. Will continue to monitor patient.  Ermalinda Memos, RN  [facility] Phone [phone]

## 2015-01-16 NOTE — Discharge Summary (Signed)
Physician Discharge Summary  AVERILL Anderson GBE:[id number] DOB: September 23, 1966 DOA: 01/14/2015  PCP: Yvette Grant, MD  Admit date: 01/14/2015 Discharge date: 01/16/2015  Time spent: 45 minutes  Recommendations for Outpatient Follow-up:  1. PCP in 1 week 2. Monitor Bmet in 1 week  Discharge Diagnoses:  Principal Problem:   Nausea vomiting and diarrhea   Gastroenteritis    Diabetes type 2, uncontrolled   Chronic diastolic CHF (congestive heart failure)   AKI (acute kidney injury)   Abdominal pain   Acute on chronic respiratory failure, unspecified whether with hypoxia or hypercapnia   Discharge Condition: stable  Diet recommendation: diabetic heart healthy  Filed Weights   01/14/15 1605 01/14/15 2232 01/15/15 1957  Weight: 131.203 kg (289 lb 4 oz) 133.539 kg (294 lb 6.4 oz) 133.811 kg (295 lb)    History of present illness:  Chief Complaint: N/V/D HPI: [name] is a 49 y.o. female who presents to the ED with c/o N/V/D for the past 24 hours.  Had 1 episode of vomiting in ED. She believed it was due to her cardiologist giving her zaroxylin which she is allergic to.  Hospital Course:  1. Nausea/vomiting/diarrhea - likely gastroenteritis -improved with supportive care -Cdiff couldn't be checked since diarrhea resolved -tolerating regular diet without any symptoms  2. DM -continue lantus, SSI -hbaic 9.4 in march  3. AKi on CKD -due to 1, diuretics held initially -creatinine back to normal -diuretics resumed  4. Chronic diastolic CHF -stable, diuretics on hold  5. chronic reps failure-Home O2 -from COPD and sarcoidosis -stable, continue spiriva, symbicort  6. Anxiety  7. Chronic back pain  Discharge Exam: Filed Vitals:   01/16/15 0936  BP: 124/71  Pulse: 83  Temp: 97.6 F (36.4 C)  Resp: 19    General: AAOx3 Cardiovascular: S1S2/RRR Respiratory: CTAB  Discharge Instructions   Discharge Instructions    Ambulatory referral to Nutrition  and Diabetic Education    Complete by:  As directed           Current Discharge Medication List    CONTINUE these medications which have NOT CHANGED   Details  !! albuterol (PROVENTIL HFA;VENTOLIN HFA) 108 (90 BASE) MCG/ACT inhaler Inhale 2 puffs into the lungs every 6 (six) hours as needed for wheezing or shortness of breath.    albuterol (PROVENTIL) (2.5 MG/3ML) 0.083% nebulizer solution Take 3 mLs (2.5 mg total) by nebulization every 6 (six) hours as needed for wheezing or shortness of breath. Qty: 75 mL, Refills: 2    ALPRAZolam (XANAX) 1 MG tablet Take 0.5 tablets (0.5 mg total) by mouth 3 (three) times daily. Qty: 30 tablet, Refills: 0    aspirin EC 81 MG tablet Take 81 mg by mouth daily.    budesonide-formoterol (SYMBICORT) 160-4.5 MCG/ACT inhaler Inhale 2 puffs into the lungs 2 (two) times daily.    esomeprazole (NEXIUM) 40 MG capsule Take 40 mg by mouth 2 (two) times daily before a meal.    fluticasone (FLONASE) 50 MCG/ACT nasal spray Place 2 sprays into both nostrils daily. Qty: 16 g, Refills: 5    !! hydrOXYzine (ATARAX/VISTARIL) 10 MG tablet Take 10 mg by mouth 3 (three) times daily as needed for itching.    insulin aspart (NOVOLOG FLEXPEN) 100 UNIT/ML FlexPen Inject 5 Units into the skin 3 (three) times daily with meals. Qty: 21 mL, Refills: 0    Insulin Glargine (LANTUS SOLOSTAR) 100 UNIT/ML Solostar Pen Inject 40 Units into the skin 2 (two) times daily. Qty: 15  mL, Refills: 0    oxyCODONE-acetaminophen (PERCOCET/ROXICET) 5-325 MG per tablet Take 1 tablet by mouth every 12 (twelve) hours as needed. Refills: 0    potassium chloride SA (K-DUR,KLOR-CON) 20 MEQ tablet Take 3 tablets (60 mEq total) by mouth 2 (two) times daily. Qty: 180 tablet, Refills: 11   Associated Diagnoses: Acute on chronic diastolic CHF (congestive heart failure)    promethazine (PHENERGAN) 25 MG tablet Take 1 tablet (25 mg total) by mouth every 6 (six) hours as needed for nausea or vomiting.  for nausea Qty: 30 tablet, Refills: 0    spironolactone (ALDACTONE) 25 MG tablet Take 25 mg by mouth 2 (two) times daily.    torsemide (DEMADEX) 100 MG tablet Take 1 tablet (100 mg total) by mouth 2 (two) times daily. Qty: 60 tablet, Refills: 1    varenicline (CHANTIX) 0.5 MG tablet Take 1 tablet (0.5 mg total) by mouth 2 (two) times daily. Qty: 60 tablet, Refills: 0    venlafaxine XR (EFFEXOR XR) 37.5 MG 24 hr capsule Take 1 capsule (37.5 mg total) by mouth daily with breakfast. Qty: 30 capsule, Refills: 3    BIOTIN PO Take 1 capsule by mouth daily.    cetirizine (ZYRTEC) 1 MG/ML syrup Take 10 mLs (10 mg total) by mouth at bedtime. Qty: 300 mL, Refills: 0    Coenzyme Q10 (CO Q 10) 10 MG CAPS Take 1 capsule by mouth daily.    diclofenac sodium (VOLTAREN) 1 % GEL Apply 4 g topically 4 (four) times daily. Qty: 100 g, Refills: 0    gabapentin (NEURONTIN) 300 MG capsule Take 300 mg by mouth at bedtime.    glimepiride (AMARYL) 1 MG tablet Take 1 mg by mouth daily with breakfast.    glucose blood (ONE TOUCH ULTRA TEST) test strip Use to check blood sugars twice a day Dx E11.9 Qty: 100 each, Refills: 2    !! hydrOXYzine (ATARAX/VISTARIL) 10 MG tablet TAKE 1 TABLET BY MOUTH THREE TIMES DAILY AS NEEDED FOR ITCHING Qty: 30 tablet, Refills: 11    Multiple Vitamin (MULTIVITAMIN) capsule Take 1 capsule by mouth daily.    nystatin (MYCOSTATIN) 100000 UNITS/ML SUSP Take 1 mL by mouth 3 (three) times daily. Swish solution then swallow. Qty: 120 mL, Refills: 0    omega-3 fish oil (MAXEPA) 1000 MG CAPS capsule Take 1 capsule by mouth daily.    !! PROAIR HFA 108 (90 BASE) MCG/ACT inhaler INHALE 2 PUFFS BY MOUTH EVERY 6 HOURS AS NEEDED Qty: 8.5 g, Refills: 1    SPIRIVA HANDIHALER 18 MCG inhalation capsule INHALE THE CONTENTS OF 1 CAPSULE VIA INHALATION DEVICE EVERY DAY Qty: 30 capsule, Refills: 0    tiZANidine (ZANAFLEX) 4 MG tablet Take 1 tablet (4 mg total) by mouth every 8 (eight)  hours as needed for muscle spasms. Qty: 30 tablet, Refills: 0     !! - Potential duplicate medications found. Please discuss with provider.    STOP taking these medications     ibuprofen (ADVIL,MOTRIN) 400 MG tablet        Allergies  Allergen Reactions  . Simvastatin Other (See Comments)    Severe leg pain and burning  . Sulfamethoxazole-Trimethoprim Nausea And Vomiting    Causes Projectile Vomiting  . Zolpidem Tartrate Other (See Comments)    Chest pain  . Azithromycin Other (See Comments)    Resistant to med  . Ceftin [Cefuroxime] Swelling and Other (See Comments)    MOUTH SWELLING  . Doxycycline Other (See Comments)  Resistant to med  . Latex Itching and Rash    Also burning sensations  . Metformin And Related Diarrhea    Severe diarrhea  . Metolazone Other (See Comments)    MYALGIAS  . Metronidazole Nausea And Vomiting  . Ciprofloxacin Nausea Only  . Tramadol Itching and Nausea Only   Follow-up Information    Follow up with Yvette Grant, MD. Schedule an appointment as soon as possible for a visit in 1 week.   Specialty:  Internal Medicine   Contact information:   [address] [facility] [place] [address] [phone]        The results of significant diagnostics from this hospitalization (including imaging, microbiology, ancillary and laboratory) are listed below for reference.    Significant Diagnostic Studies: Dg Chest 2 View  01/14/2015   CLINICAL DATA:  Abdominal pain and chest pain as well as nausea, vomiting and diarrhea beginning yesterday morning. Back spasms. Known pulmonary sarcoidosis.  EXAM: CHEST  2 VIEW  COMPARISON:  01/13/2015 and 10/18/2014  FINDINGS: Lungs are adequately inflated with stable interstitial changes over the mid to lower lungs. No focal consolidation or effusion. Borderline stable cardiomegaly. Mild prominence of the main pulmonary artery segment unchanged. Remainder of the exam is unchanged.   IMPRESSION: No active cardiopulmonary disease.  Stable interstitial disease likely related to patient's known pulmonary sarcoidosis. Stable prominence of the main pulmonary artery segment which may be seen with pulmonary arterial hypertension.   Electronically Signed   By: Marin Olp M.D.   On: 01/14/2015 18:50   Dg Chest 2 View  01/13/2015   CLINICAL DATA:  Shortness of breath. Follow-up pneumonia. History of COPD and CHF and sarcoid.  EXAM: CHEST  2 VIEW  COMPARISON:  12/21/2014 and 12/18/2014 and 07/22/2014  FINDINGS: Heart size is stable and within normal limits. Again noted are prominent central vascular structures. There are scattered linear densities throughout the lower lungs, right side greater the left. These lung findings have not significantly changed. Upper lungs remain clear. No large areas of airspace disease or consolidation. No large pleural effusions. No acute bone abnormality.  IMPRESSION: Persistent patchy linear densities at the lung bases. There has been minimal change from the most recent comparison examinations. Suspect that most of these parenchymal densities are related to chronic changes. No large area of airspace disease.   Electronically Signed   By: Markus Daft M.D.   On: 01/13/2015 10:06   Ct Angio Chest Pe W/cm &/or Wo Cm  12/18/2014   CLINICAL DATA:  Shortness breath, chest pain. History of sarcoidosis, COPD.  EXAM: CT ANGIOGRAPHY CHEST WITH CONTRAST  TECHNIQUE: Multidetector CT imaging of the chest was performed using the standard protocol during bolus administration of intravenous contrast. Multiplanar CT image reconstructions and MIPs were obtained to evaluate the vascular anatomy.  CONTRAST:  35mL OMNIPAQUE IOHEXOL 350 MG/ML SOLN  COMPARISON:  08/04/2013  FINDINGS: No filling defects in the pulmonary arteries to suggest pulmonary emboli. The distal vessels are not well visualized due to respiratory motion. Mild cardiomegaly. Aorta is normal caliber. Pulmonary artery  remains dilated measuring 36 mm compared to 38 mm previously. Central pulmonary arteries appear prominent. Findings suggest pulmonary arterial hypertension.  Mild emphysema/COPD. Diffuse ground-glass opacities throughout the lungs are stable since prior study. No pleural effusions. Chronic bibasilar densities likely reflect scarring. No pleural effusions.  Small scattered borderline sized mediastinal and bilateral hilar lymph nodes, similar to prior study. No axillary adenopathy.  Chest wall  soft tissues are unremarkable. Imaging into the upper abdomen demonstrates diffuse fatty infiltration of the liver. No acute findings.  No acute bony abnormality or focal bone lesion.  Review of the MIP images confirms the above findings.  IMPRESSION: No evidence of pulmonary embolus. The distal peripheral vessels are not well visualized due to respiratory motion.  Borderline heart size. Stable dilatation of the pulmonary arteries compatible with pulmonary arterial hypertension.  Underline COPD. Stable diffuse ground-glass opacities throughout the lungs and bibasilar scarring.   Electronically Signed   By: Rolm Baptise M.D.   On: 12/18/2014 23:55   Ct Chest High Resolution  12/23/2014   CLINICAL DATA:  49 year old female with lung infiltrates. 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  EXAM: CT CHEST WITHOUT CONTRAST  TECHNIQUE: Multidetector CT imaging of the chest was performed following the standard protocol without intravenous contrast. High resolution imaging of the lungs, as well as inspiratory and expiratory imaging, was performed.  COMPARISON:  Chest CT 12/18/2014.  FINDINGS: Mediastinum/Lymph Nodes: Heart size is mildly enlarged. There is no significant pericardial fluid, thickening or pericardial calcification. There is atherosclerosis of the thoracic aorta, the great vessels of the mediastinum and the coronary arteries, including calcified atherosclerotic plaque in the left anterior descending coronary artery. Dilatation of  the pulmonic trunk (4.4 cm in diameter), suggesting pulmonary arterial hypertension. Multiple borderline enlarged and minimally enlarged mediastinal and bilateral hilar lymph nodes, measuring up to 1 cm in short axis in the right paratracheal nodal station. Esophagus is unremarkable in appearance. No axillary lymphadenopathy.  Lungs/Pleura: The appearance of the lungs is again very unusual, with areas of moderate to severe centrilobular and paraseptal emphysema scattered in a patchy distribution throughout the lungs, with areas of significant involvement, and other areas of near complete sparing. There continues to be extensive thickening of the peribronchovascular interstitium, which is mild in some places, and very severe in other places. The most severely thickened areas of peribronchovascular interstitium remain in the lung bases, where there is some associated mild cylindrical bronchiectasis, these findings have been present on prior studies dating back to 04/27/2010, albeit more severe on the more recent prior examinations than the remote prior studies. There are a few new nodular appearing areas of airspace consolidation in the lungs bilaterally compared to the most recent prior study from 12/18/2014, presumably infectious or inflammatory. These are within the right lower lobe (images 22 and 26 of series 4) and the left lower lobe (images 32 and 38 of series 4). High-resolution images redemonstrate the above findings, and otherwise demonstrate no significant regions of peripheral subpleural reticulation or frank honeycombing. Inspiratory and expiratory imaging is unremarkable. No pleural effusions.  Upper Abdomen: Diffuse low-attenuation in the hepatic parenchyma, compatible with hepatic steatosis. Two tiny locules of gas are noted within the liver in segment 4 on images 56 and 57 of series 3, favored to be pneumobilia.  Musculoskeletal/Soft Tissues: Old healed fractures of the lateral aspects of the right  seventh ninth and tenth ribs are noted. There are no aggressive appearing lytic or blastic lesions noted in the visualized portions of the skeleton.  IMPRESSION: 1. 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 The pattern is somewhat unusual, and has slowly progressed when compared to remote prior studies dating back to 04/27/2010. Today's examination does demonstrate four new areas of nodular airspace consolidation in the lower lobes of the lungs bilaterally (detailed above), which have developed in the past 3 days, presumably infectious  or inflammatory in etiology. 2. Dilatation of the pulmonic trunk, suggestive of pulmonary arterial hypertension. 3. Mild cardiomegaly. 4. 2 tiny locules of gas in segment 4 of the liver, presumably pneumobilia. However, the liver is incompletely visualized. Clinical correlation is recommended. If patient has had prior sphincterotomy, no further workup or evaluation may be warranted. In the absence of history of prior sphincterotomy or other surgical intervention of the biliary tract, clinical correlation for signs and symptoms of ascending cholangitis would be recommended. 5. Hepatic steatosis.   Electronically Signed   By: [name] M.D.   On: 12/23/2014 08:46   Nm Pulmonary Perf And Vent  12/17/2014   CLINICAL DATA:  49 year old female with shortness of breath and chest discomfort. Initial encounter.  EXAM: NUCLEAR MEDICINE VENTILATION - PERFUSION LUNG SCAN  TECHNIQUE: Ventilation images were obtained in multiple projections using inhaled aerosol technetium 99 M DTPA. Perfusion images were obtained in multiple projections after intravenous injection of Tc-48m MAA.  RADIOPHARMACEUTICALS:  44.0 mCi Tc-10m DTPA aerosol and 5 point for mCi Tc-67m MAA  COMPARISON:  Portable chest radiograph 12/16/2014.  FINDINGS: Ventilation: Is heterogeneous bilaterally, in part due to central clumping  of the ventilation agent. Scattered bilateral peripheral ventilation defects.  Perfusion: Scattered bilateral small and peripheral perfusion defects, but appear matched in both lungs.  IMPRESSION: Abnormal scan with matched bilateral pulmonary ventilation and perfusion defects that could reflect chronic obstructed pulmonary disease or chronic pulmonary embolus.  No scintigraphic evidence of acute PE.   Electronically Signed   By: Genevie Ann M.D.   On: 12/17/2014 13:05   Dg Chest Port 1 View  12/21/2014   CLINICAL DATA:  Respiratory failure  EXAM: PORTABLE CHEST - 1 VIEW  COMPARISON:  Chest x-ray and CT scan of the chest of December 18, 2014  FINDINGS: The lungs are well-expanded. The pulmonary interstitial markings remain increased but have improved slightly. There is subsegmental atelectasis at the right lung base that is stable. No significant pleural effusion is observed. The cardiac silhouette remains enlarged. The central pulmonary vascularity remains prominent but is less engorged today.  IMPRESSION: COPD. Mild improvement in interstitial edema secondary to CHF. There is persistent right basilar atelectasis or pneumonia.   Electronically Signed   By: David  Martinique   On: 12/21/2014 07:29   Dg Chest Portable 1 View  12/18/2014   CLINICAL DATA:  Shortness of Breath, acute  EXAM: PORTABLE CHEST - 1 VIEW  COMPARISON:  December 16, 2014  FINDINGS: There is interstitial edema superimposed on emphysematous change and bibasilar scarring. Heart is mildly enlarged with pulmonary venous hypertension. No adenopathy. No bone lesions.  IMPRESSION: Congestive heart failure superimposed on emphysematous change. No airspace consolidation.   Electronically Signed   By: Lowella Grip III M.D.   On: 12/18/2014 19:20   Mr Abd W/wo Cm/mrcp  12/26/2014   CLINICAL DATA:  Chronic right upper quadrant pain. Hepatic steatosis.  EXAM: MRI ABDOMEN WITHOUT AND WITH CONTRAST (INCLUDING MRCP)  TECHNIQUE: Multiplanar multisequence MR  imaging of the abdomen was performed both before and after the administration of intravenous contrast. Heavily T2-weighted images of the biliary and pancreatic ducts were obtained, and three-dimensional MRCP images were rendered by post processing.  CONTRAST:  64mL MULTIHANCE GADOBENATE DIMEGLUMINE 529 MG/ML IV SOLN  COMPARISON:  Chest CT on 12/22/2014, and AP CT on 10/12/2011  FINDINGS: Exam is technically degraded by motion artifact and large patient habitus and claustrophobia.  Lower chest: Bibasilar pulmonary opacity as better visualized on recent chest  CT.  Hepatobiliary: Diffuse hepatic steatosis is demonstrated. No focal liver masses are visualized.  Gallbladder is unremarkable. MRCP images are particularly degraded by artifact, however there is no definite evidence of biliary ductal dilatation.  Pancreas: No masses, inflammatory changes, or fluid collections demonstrated.  Spleen:  Within normal limits in size and appearance.  Adrenal Glands:  No masses identified.  Kidneys: No renal masses identified. No evidence of hydronephrosis.  Stomach/Bowel/Peritoneum: No evidence of wall thickening, mass, or obstruction involving visualized abdominal bowel.  Vascular/Lymphatic: No pathologically enlarged lymph nodes identified. No other significant abnormality noted.  Other:  Diffuse abdominal wall subcutaneous edema noted.  Musculoskeletal:  No suspicious bone lesions identified.  IMPRESSION: Technically degraded exam due to motion artifact and patient habitus. No definite evidence of gallbladder disease, biliary dilatation, or other acute findings.  Diffuse hepatic steatosis.   Electronically Signed   By: Earle Gell M.D.   On: 12/26/2014 09:26   US Abdomen Limited Ruq  01/14/2015   CLINICAL DATA:  Abdominal pain.  EXAM: US ABDOMEN LIMITED - RIGHT UPPER QUADRANT  COMPARISON:  Ultrasound dated 12/25/2014  FINDINGS: Gallbladder:  No gallstones or wall thickening visualized. No sonographic Murphy sign noted.   Common bile duct:  Diameter: 4 mm, normal.  Liver:  Diffuse increased echogenicity consistent with hepatic steatosis. No focal lesions. Hepatomegaly.  IMPRESSION: Hepatic steatosis and hepatomegaly as demonstrated on the prior CT scan of the chest dated 12/21/2014.  No acute abnormalities.   Electronically Signed   By: Lorriane Shire M.D.   On: 01/14/2015 19:09   US Abdomen Limited Ruq  12/25/2014   CLINICAL DATA:  Pneumobilia  EXAM: US ABDOMEN LIMITED - RIGHT UPPER QUADRANT  COMPARISON:  None.  FINDINGS: Gallbladder:  No gallstones or wall thickening visualized. No sonographic Murphy sign noted.  Common bile duct:  Diameter: 4 mm. Where visualized, no filling defect or evidence of pneumobilia.  Liver:  Increased liver echogenicity with coarsened texture. No focal lesion is seen. There is antegrade flow in the imaged portal venous system.  IMPRESSION: 1. No explanation for liver findings on chest CT 12/21/2014. 2. Hepatic steatosis.   Electronically Signed   By: Monte Fantasia M.D.   On: 12/25/2014 16:40    Microbiology: Recent Results (from the past 240 hour(s))  MRSA PCR Screening     Status: None   Collection Time: 01/15/15  1:23 PM  Result Value Ref Range Status   MRSA by PCR NEGATIVE NEGATIVE Final    Comment:        The GeneXpert MRSA Assay (FDA approved for NASAL specimens only), is one component of a comprehensive MRSA colonization surveillance program. It is not intended to diagnose MRSA infection nor to guide or monitor treatment for MRSA infections.      Labs: Basic Metabolic Panel:  Recent Labs Lab 01/11/15 1559 01/13/15 0945 01/14/15 1616 01/15/15 0622 01/16/15 0658  NA 134* 129*  129* 130* 133* 135  K 3.2* 4.4  4.4 3.7 3.3* 3.4*  CL 91* 83*  83* 85* 90* 96  CO2 34* 31  31 27  33* 31  GLUCOSE 257* 372*  372* 228* 194* 130*  BUN 11 19  19 20 18 7   CREATININE 0.88 1.43*  1.43* 1.49* 1.29* 0.98  CALCIUM 8.9 12.1*  12.1* 10.3 8.8 8.9  MG  --   --  1.4*  --    --    Liver Function Tests:  Recent Labs Lab 01/13/15 0945 01/14/15 1616 01/15/15 0622  AST  33  33 47* 32  ALT 90*  90* 79* 59*  ALKPHOS 144*  144* 109 88  BILITOT 1.2  1.2 1.8* 1.5*  PROT 8.9*  8.9* 8.5* 6.8  ALBUMIN 4.6  4.6 4.2 3.3*    Recent Labs Lab 01/14/15 1616  LIPASE 44   No results for input(s): AMMONIA in the last 168 hours. CBC:  Recent Labs Lab 01/13/15 0945 01/14/15 1616 01/15/15 0622 01/16/15 0658  WBC 8.9 8.0 6.2 3.9*  NEUTROABS 6.1 5.5  --   --   HGB 15.8* 16.1* 13.2 12.5  HCT 47.4* 47.0* 40.1 39.3  MCV 76.6* 77.3* 79.2 81.2  PLT 210.0 190 157 126*   Cardiac Enzymes: No results for input(s): CKTOTAL, CKMB, CKMBINDEX, TROPONINI in the last 168 hours. BNP: BNP (last 3 results)  Recent Labs  12/09/14 0330 12/13/14 0820 12/18/14 1846  BNP 31.0 29.7 231.0*    ProBNP (last 3 results)  Recent Labs  02/11/14 0021 04/17/14 0120 07/20/14 1817  PROBNP 32.8 44.7 39.7    CBG:  Recent Labs Lab 01/15/15 1622 01/15/15 1953 01/15/15 2356 01/16/15 0411 01/16/15 0806  GLUCAP 205* 278* 139* 121* 152*       Signed:  Tamarcus Condie  [facility] 01/16/2015, 11:11 AM

## 2015-01-16 NOTE — Progress Notes (Signed)
Utilization Review completed.  

## 2015-01-17 DIAGNOSIS — J9620 Acute and chronic respiratory failure, unspecified whether with hypoxia or hypercapnia: Secondary | ICD-10-CM | POA: Diagnosis not present

## 2015-01-17 LAB — GLUCOSE, CAPILLARY: Glucose-Capillary: 115 mg/dL — ABNORMAL HIGH (ref 70–99)

## 2015-01-18 ENCOUNTER — Other Ambulatory Visit: Payer: Self-pay | Admitting: Internal Medicine

## 2015-01-18 DIAGNOSIS — J9620 Acute and chronic respiratory failure, unspecified whether with hypoxia or hypercapnia: Secondary | ICD-10-CM | POA: Diagnosis not present

## 2015-01-19 DIAGNOSIS — J9819 Other pulmonary collapse: Secondary | ICD-10-CM | POA: Diagnosis not present

## 2015-01-19 DIAGNOSIS — J9620 Acute and chronic respiratory failure, unspecified whether with hypoxia or hypercapnia: Secondary | ICD-10-CM | POA: Diagnosis not present

## 2015-01-19 NOTE — Telephone Encounter (Signed)
Looks like she is DC'd. Can we make sure she is referred to CHF clinic? Thanks, Richardson Dopp, PA-C   01/19/2015 3:42 PM

## 2015-01-20 ENCOUNTER — Other Ambulatory Visit: Payer: Self-pay | Admitting: Internal Medicine

## 2015-01-20 DIAGNOSIS — J9620 Acute and chronic respiratory failure, unspecified whether with hypoxia or hypercapnia: Secondary | ICD-10-CM | POA: Diagnosis not present

## 2015-01-21 DIAGNOSIS — J9620 Acute and chronic respiratory failure, unspecified whether with hypoxia or hypercapnia: Secondary | ICD-10-CM | POA: Diagnosis not present

## 2015-01-21 NOTE — Telephone Encounter (Signed)
I have sent an In Basket message to [facility] to arrange an appt for her in CHF.

## 2015-01-22 ENCOUNTER — Other Ambulatory Visit: Payer: Self-pay

## 2015-01-22 ENCOUNTER — Ambulatory Visit: Payer: Medicare Other | Admitting: Cardiology

## 2015-01-22 DIAGNOSIS — J9620 Acute and chronic respiratory failure, unspecified whether with hypoxia or hypercapnia: Secondary | ICD-10-CM | POA: Diagnosis not present

## 2015-01-23 ENCOUNTER — Other Ambulatory Visit: Payer: Self-pay | Admitting: Internal Medicine

## 2015-01-24 DIAGNOSIS — J9620 Acute and chronic respiratory failure, unspecified whether with hypoxia or hypercapnia: Secondary | ICD-10-CM | POA: Diagnosis not present

## 2015-01-25 ENCOUNTER — Telehealth: Payer: Self-pay | Admitting: Internal Medicine

## 2015-01-25 DIAGNOSIS — J9620 Acute and chronic respiratory failure, unspecified whether with hypoxia or hypercapnia: Secondary | ICD-10-CM | POA: Diagnosis not present

## 2015-01-25 MED ORDER — FLUCONAZOLE 150 MG PO TABS: 150.0000 mg | ORAL_TABLET | Freq: Once | ORAL | Status: DC

## 2015-01-25 NOTE — Telephone Encounter (Signed)
Please call in rx refill as pended above Thanks!

## 2015-01-25 NOTE — Telephone Encounter (Signed)
Called refill into pharmacy spoke with ben gave md approval.../lmb

## 2015-01-25 NOTE — Telephone Encounter (Signed)
Patient states she was in the hospital and has developed a yeast infection from the antibiotics taken.  She is requesting Dr. Asa Lente to send something in for yeast infection to [facility] on [address].

## 2015-01-25 NOTE — Telephone Encounter (Signed)
Notified pt diflucan sent to pharmacy...[name]

## 2015-01-26 ENCOUNTER — Encounter (HOSPITAL_COMMUNITY): Payer: Self-pay

## 2015-01-26 DIAGNOSIS — J9620 Acute and chronic respiratory failure, unspecified whether with hypoxia or hypercapnia: Secondary | ICD-10-CM | POA: Diagnosis not present

## 2015-01-27 DIAGNOSIS — J9620 Acute and chronic respiratory failure, unspecified whether with hypoxia or hypercapnia: Secondary | ICD-10-CM | POA: Diagnosis not present

## 2015-01-28 DIAGNOSIS — J9620 Acute and chronic respiratory failure, unspecified whether with hypoxia or hypercapnia: Secondary | ICD-10-CM | POA: Diagnosis not present

## 2015-01-29 DIAGNOSIS — J9620 Acute and chronic respiratory failure, unspecified whether with hypoxia or hypercapnia: Secondary | ICD-10-CM | POA: Diagnosis not present

## 2015-01-30 DIAGNOSIS — M542 Cervicalgia: Secondary | ICD-10-CM | POA: Diagnosis not present

## 2015-01-30 DIAGNOSIS — J9620 Acute and chronic respiratory failure, unspecified whether with hypoxia or hypercapnia: Secondary | ICD-10-CM | POA: Diagnosis not present

## 2015-01-31 DIAGNOSIS — J9620 Acute and chronic respiratory failure, unspecified whether with hypoxia or hypercapnia: Secondary | ICD-10-CM | POA: Diagnosis not present

## 2015-02-01 DIAGNOSIS — G4733 Obstructive sleep apnea (adult) (pediatric): Secondary | ICD-10-CM | POA: Diagnosis not present

## 2015-02-01 DIAGNOSIS — J9620 Acute and chronic respiratory failure, unspecified whether with hypoxia or hypercapnia: Secondary | ICD-10-CM | POA: Diagnosis not present

## 2015-02-02 ENCOUNTER — Encounter: Payer: Self-pay | Admitting: Internal Medicine

## 2015-02-02 DIAGNOSIS — J9620 Acute and chronic respiratory failure, unspecified whether with hypoxia or hypercapnia: Secondary | ICD-10-CM | POA: Diagnosis not present

## 2015-02-03 DIAGNOSIS — J9620 Acute and chronic respiratory failure, unspecified whether with hypoxia or hypercapnia: Secondary | ICD-10-CM | POA: Diagnosis not present

## 2015-02-04 DIAGNOSIS — J9620 Acute and chronic respiratory failure, unspecified whether with hypoxia or hypercapnia: Secondary | ICD-10-CM | POA: Diagnosis not present

## 2015-02-05 DIAGNOSIS — J9620 Acute and chronic respiratory failure, unspecified whether with hypoxia or hypercapnia: Secondary | ICD-10-CM | POA: Diagnosis not present

## 2015-02-06 DIAGNOSIS — J9620 Acute and chronic respiratory failure, unspecified whether with hypoxia or hypercapnia: Secondary | ICD-10-CM | POA: Diagnosis not present

## 2015-02-07 DIAGNOSIS — J9620 Acute and chronic respiratory failure, unspecified whether with hypoxia or hypercapnia: Secondary | ICD-10-CM | POA: Diagnosis not present

## 2015-02-08 ENCOUNTER — Other Ambulatory Visit: Payer: Self-pay | Admitting: Family

## 2015-02-08 ENCOUNTER — Other Ambulatory Visit: Payer: Self-pay | Admitting: Internal Medicine

## 2015-02-08 DIAGNOSIS — J9620 Acute and chronic respiratory failure, unspecified whether with hypoxia or hypercapnia: Secondary | ICD-10-CM | POA: Diagnosis not present

## 2015-02-08 MED ORDER — TIZANIDINE HCL 4 MG PO TABS: 4.0000 mg | ORAL_TABLET | Freq: Three times a day (TID) | ORAL | Status: DC | PRN

## 2015-02-08 MED ORDER — VARENICLINE TARTRATE 0.5 MG PO TABS: 0.5000 mg | ORAL_TABLET | Freq: Two times a day (BID) | ORAL | Status: DC

## 2015-02-08 NOTE — Addendum Note (Signed)
Addended by: Earnstine Regal on: 02/08/2015 03:37 PM   Modules accepted: Orders

## 2015-02-09 ENCOUNTER — Encounter (HOSPITAL_COMMUNITY): Payer: Self-pay

## 2015-02-09 DIAGNOSIS — J9620 Acute and chronic respiratory failure, unspecified whether with hypoxia or hypercapnia: Secondary | ICD-10-CM | POA: Diagnosis not present

## 2015-02-09 MED ORDER — TIZANIDINE HCL 4 MG PO TABS: 4.0000 mg | ORAL_TABLET | Freq: Three times a day (TID) | ORAL | Status: DC | PRN

## 2015-02-10 DIAGNOSIS — J9620 Acute and chronic respiratory failure, unspecified whether with hypoxia or hypercapnia: Secondary | ICD-10-CM | POA: Diagnosis not present

## 2015-02-11 DIAGNOSIS — J9620 Acute and chronic respiratory failure, unspecified whether with hypoxia or hypercapnia: Secondary | ICD-10-CM | POA: Diagnosis not present

## 2015-02-12 DIAGNOSIS — J9620 Acute and chronic respiratory failure, unspecified whether with hypoxia or hypercapnia: Secondary | ICD-10-CM | POA: Diagnosis not present

## 2015-02-13 DIAGNOSIS — J9620 Acute and chronic respiratory failure, unspecified whether with hypoxia or hypercapnia: Secondary | ICD-10-CM | POA: Diagnosis not present

## 2015-02-14 DIAGNOSIS — J9620 Acute and chronic respiratory failure, unspecified whether with hypoxia or hypercapnia: Secondary | ICD-10-CM | POA: Diagnosis not present

## 2015-02-15 ENCOUNTER — Encounter: Payer: Self-pay | Admitting: Internal Medicine

## 2015-02-15 DIAGNOSIS — J9620 Acute and chronic respiratory failure, unspecified whether with hypoxia or hypercapnia: Secondary | ICD-10-CM | POA: Diagnosis not present

## 2015-02-16 ENCOUNTER — Encounter: Payer: Self-pay | Admitting: Pulmonary Disease

## 2015-02-16 ENCOUNTER — Other Ambulatory Visit: Payer: Medicare Other

## 2015-02-16 ENCOUNTER — Ambulatory Visit (INDEPENDENT_AMBULATORY_CARE_PROVIDER_SITE_OTHER): Payer: Medicare Other | Admitting: Pulmonary Disease

## 2015-02-16 VITALS — BP 130/70 | HR 105 | Temp 97.5°F | Ht 68.0 in | Wt 299.0 lb

## 2015-02-16 DIAGNOSIS — D869 Sarcoidosis, unspecified: Secondary | ICD-10-CM | POA: Diagnosis not present

## 2015-02-16 DIAGNOSIS — J9620 Acute and chronic respiratory failure, unspecified whether with hypoxia or hypercapnia: Secondary | ICD-10-CM | POA: Diagnosis not present

## 2015-02-16 DIAGNOSIS — J9611 Chronic respiratory failure with hypoxia: Secondary | ICD-10-CM

## 2015-02-16 DIAGNOSIS — J438 Other emphysema: Secondary | ICD-10-CM

## 2015-02-16 DIAGNOSIS — E662 Morbid (severe) obesity with alveolar hypoventilation: Secondary | ICD-10-CM

## 2015-02-16 DIAGNOSIS — I272 Pulmonary hypertension, unspecified: Secondary | ICD-10-CM

## 2015-02-16 DIAGNOSIS — I270 Primary pulmonary hypertension: Secondary | ICD-10-CM

## 2015-02-16 NOTE — Progress Notes (Signed)
   Subjective:    [name], female    DOB: [birth year], 49 y.o.   MRN: [id number]  HPI The patient comes in today for follow-up of her multiple pulmonary and cardiac issues. She has known COPD, obstructive sleep apnea, obesity hypoventilation, chronic diastolic heart failure, and recently was found to have abnormal pulmonary densities on CT chest and severe pulmonary hypertension estimated by echocardiogram with evidence for diastolic dysfunction and left sided overload. She was recently in the hospital for all of this with decompensation, and was found to have injected herself with a pain medication through her IV port. The patient admits that she did do this, but denies an ongoing history for IV drug abuse.  She has seen progressive dyspnea on exertion since being out of the hospital, but feels that her fluid overload has not been as much of an issue. She has had a high-resolution scan while in the hospital, and did not show lymphadenopathy, any significant interstitial disease, but did show some scattered nodular airspace disease in the bases. The question has been raised whether this may be related to sarcoid, however I am also concerned about the possibility of changes related to IV injection of talc-containing material.   Review of Systems  Constitutional: Negative for fever and unexpected weight change.  HENT: Negative for congestion, dental problem, ear pain, nosebleeds, postnasal drip, rhinorrhea, sinus pressure, sneezing, sore throat and trouble swallowing.   Eyes: Negative for redness and itching.  Respiratory: Positive for shortness of breath and wheezing. Negative for cough and chest tightness.   Cardiovascular: Positive for leg swelling. Negative for palpitations.  Gastrointestinal: Negative for nausea and vomiting.  Genitourinary: Negative for dysuria.  Musculoskeletal: Negative for joint swelling.  Skin: Negative for rash.  Neurological: Negative for headaches.    Hematological: Does not bruise/bleed easily.  Psychiatric/Behavioral: Negative for dysphoric mood. The patient is not nervous/anxious.        Objective:   Physical Exam Morbidly obese female in no acute distress Nose without purulence or discharge noted Neck without lymphadenopathy or thyromegaly Chest with basilar crackles, mildly decreased breath sounds, no active wheezing Cardiac exam with mildly tachycardic but regular rhythm, 1/6 systolic murmur Lower extremities with 2+ edema, no cyanosis Alert and oriented, moves all 4 extremities.       Assessment & Plan:

## 2015-02-16 NOTE — Assessment & Plan Note (Signed)
The patient has severe pulmonary hypertension estimated by echocardiogram, along with evidence for left sided pressure overload. More than likely, this is a much more significant contributor to her worsening dyspnea on exertion than possible sarcoid.  Her CT chest is really not impressive enough to cause as much dyspnea and she is describing, but certainly can be a contributing factor with everything else going on. At this point, it may be worth doing a right heart cath to estimate pulmonary pressures, and also to see how much left-sided pressure overload she truly has. I would be very hesitant to start her on any type of pulmonary hypertension therapy, given that she has multiple etiologies for this, as well as her history of noncompliance and IV drug injection.

## 2015-02-16 NOTE — Patient Instructions (Signed)
Will check a blood test to try and determine if she has sarcoid and if it is active? Continue on your current breathing medications and oxygen Continue to watch your fluid balance very carefully.  Will call you this week to discuss our plan going forward.

## 2015-02-16 NOTE — Assessment & Plan Note (Signed)
The patient apparently has a history of sarcoidosis, however we have no documentation or this. She has never had lymphadenopathy or interstitial disease of significance on her chest CTs, but now has some abnormal densities that appear inflammatory or infectious in nature. She still does not have obvious interstitial lung disease on high-resolution CT. Complicating all of this, is that she has injected herself with a pain medication while in the hospital through her IV line, but denies ongoing IV drug abuse. I am wondering if the changes on CT while she was in the hospital could be somehow related to this?  I think it is very important that we verify that she does have sarcoidosis, and to try to come to some conclusion if that is having anything to do with her dyspnea and CT changes. She has significant diabetes and morbid obesity, and therefore is at high risk for treatment with high-dose thyroid. She also is at risk for complications associated with other immunosuppressive agents. My current feeling is that her shortness of breath is primarily related to her morbid obesity, deconditioning, chronic diastolic heart failure, known COPD, and now she has been noted to have severe pulmonary hypertension by recent echocardiogram. Perhaps it will be better to do a right heart catheterization, and find out about her true pulmonary pressures and also see if her wedge pressure is severely elevated. She has a lot of reasons to have significant pulmonary hypertension, and it is unclear if this has anything to do with her sarcoid as well.

## 2015-02-17 DIAGNOSIS — J9620 Acute and chronic respiratory failure, unspecified whether with hypoxia or hypercapnia: Secondary | ICD-10-CM | POA: Diagnosis not present

## 2015-02-17 LAB — ANGIOTENSIN CONVERTING ENZYME: ANGIOTENSIN-CONVERTING ENZYME: 100 U/L — AB (ref 8–52)

## 2015-02-18 DIAGNOSIS — J9819 Other pulmonary collapse: Secondary | ICD-10-CM | POA: Diagnosis not present

## 2015-02-18 DIAGNOSIS — J9620 Acute and chronic respiratory failure, unspecified whether with hypoxia or hypercapnia: Secondary | ICD-10-CM | POA: Diagnosis not present

## 2015-02-19 DIAGNOSIS — J9620 Acute and chronic respiratory failure, unspecified whether with hypoxia or hypercapnia: Secondary | ICD-10-CM | POA: Diagnosis not present

## 2015-02-20 DIAGNOSIS — J9620 Acute and chronic respiratory failure, unspecified whether with hypoxia or hypercapnia: Secondary | ICD-10-CM | POA: Diagnosis not present

## 2015-02-21 ENCOUNTER — Other Ambulatory Visit: Payer: Self-pay | Admitting: Internal Medicine

## 2015-02-21 DIAGNOSIS — J9620 Acute and chronic respiratory failure, unspecified whether with hypoxia or hypercapnia: Secondary | ICD-10-CM | POA: Diagnosis not present

## 2015-02-22 DIAGNOSIS — J9620 Acute and chronic respiratory failure, unspecified whether with hypoxia or hypercapnia: Secondary | ICD-10-CM | POA: Diagnosis not present

## 2015-02-23 ENCOUNTER — Ambulatory Visit: Payer: Self-pay | Admitting: Dietician

## 2015-02-23 ENCOUNTER — Encounter: Payer: Self-pay | Admitting: Pulmonary Disease

## 2015-02-23 ENCOUNTER — Telehealth: Payer: Self-pay | Admitting: Pulmonary Disease

## 2015-02-23 DIAGNOSIS — J9620 Acute and chronic respiratory failure, unspecified whether with hypoxia or hypercapnia: Secondary | ICD-10-CM | POA: Diagnosis not present

## 2015-02-23 NOTE — Telephone Encounter (Signed)
Pt sent an email requesting lab results and wanting to know if her sarcoid is active or not? Please advise thanks

## 2015-02-24 DIAGNOSIS — J9620 Acute and chronic respiratory failure, unspecified whether with hypoxia or hypercapnia: Secondary | ICD-10-CM | POA: Diagnosis not present

## 2015-02-25 ENCOUNTER — Telehealth (HOSPITAL_COMMUNITY): Payer: Self-pay | Admitting: Vascular Surgery

## 2015-02-25 ENCOUNTER — Other Ambulatory Visit: Payer: Self-pay | Admitting: Pulmonary Disease

## 2015-02-25 ENCOUNTER — Encounter: Payer: Self-pay | Admitting: Internal Medicine

## 2015-02-25 DIAGNOSIS — I272 Pulmonary hypertension, unspecified: Secondary | ICD-10-CM

## 2015-02-25 DIAGNOSIS — J9620 Acute and chronic respiratory failure, unspecified whether with hypoxia or hypercapnia: Secondary | ICD-10-CM | POA: Diagnosis not present

## 2015-02-25 NOTE — Telephone Encounter (Signed)
Spoke with pt in detail about her lab work and all of her issues. I am just not seen a lot of change on the CT chest a attributable to sarcoid that would push me toward high-dose prednisone. She has a lot of reasons for severe dyspnea, including morbid obesity, very severe pulmonary hypertension estimated by echo, COPD/emphysema, and a history of injecting medication intravenously. I suspect her severe pulmonary hypertension is one of the main reasons for her worsening dyspnea, and I would like to get this more thoroughly evaluated. I will refer her to cardiology for a right heart catheterization, and the patient is agreeable to this approach.

## 2015-02-25 NOTE — Telephone Encounter (Signed)
Dr. [name] will like to get this pt scheduled for a right heart cath... pulm htn.. Please advise

## 2015-02-25 NOTE — Telephone Encounter (Signed)
[place] please advise, thank you.

## 2015-02-26 DIAGNOSIS — J9620 Acute and chronic respiratory failure, unspecified whether with hypoxia or hypercapnia: Secondary | ICD-10-CM | POA: Diagnosis not present

## 2015-02-27 ENCOUNTER — Emergency Department (HOSPITAL_COMMUNITY): Payer: Medicare Other

## 2015-02-27 ENCOUNTER — Encounter (HOSPITAL_COMMUNITY): Payer: Self-pay | Admitting: Emergency Medicine

## 2015-02-27 ENCOUNTER — Emergency Department (HOSPITAL_COMMUNITY)
Admission: EM | Admit: 2015-02-27 | Discharge: 2015-02-27 | Disposition: A | Discharge status: Home / Self Care | Payer: Medicare Other | Attending: Emergency Medicine | Admitting: Emergency Medicine

## 2015-02-27 DIAGNOSIS — Z86010 Personal history of colonic polyps: Secondary | ICD-10-CM | POA: Diagnosis not present

## 2015-02-27 DIAGNOSIS — Z7951 Long term (current) use of inhaled steroids: Secondary | ICD-10-CM | POA: Diagnosis not present

## 2015-02-27 DIAGNOSIS — M797 Fibromyalgia: Secondary | ICD-10-CM | POA: Diagnosis not present

## 2015-02-27 DIAGNOSIS — J449 Chronic obstructive pulmonary disease, unspecified: Secondary | ICD-10-CM | POA: Insufficient documentation

## 2015-02-27 DIAGNOSIS — G8929 Other chronic pain: Secondary | ICD-10-CM | POA: Diagnosis not present

## 2015-02-27 DIAGNOSIS — Z862 Personal history of diseases of the blood and blood-forming organs and certain disorders involving the immune mechanism: Secondary | ICD-10-CM | POA: Insufficient documentation

## 2015-02-27 DIAGNOSIS — G4730 Sleep apnea, unspecified: Secondary | ICD-10-CM | POA: Diagnosis not present

## 2015-02-27 DIAGNOSIS — Z8742 Personal history of other diseases of the female genital tract: Secondary | ICD-10-CM | POA: Diagnosis not present

## 2015-02-27 DIAGNOSIS — I10 Essential (primary) hypertension: Secondary | ICD-10-CM | POA: Diagnosis not present

## 2015-02-27 DIAGNOSIS — K2970 Gastritis, unspecified, without bleeding: Secondary | ICD-10-CM | POA: Diagnosis not present

## 2015-02-27 DIAGNOSIS — M79672 Pain in left foot: Secondary | ICD-10-CM | POA: Diagnosis not present

## 2015-02-27 DIAGNOSIS — F410 Panic disorder [episodic paroxysmal anxiety] without agoraphobia: Secondary | ICD-10-CM | POA: Insufficient documentation

## 2015-02-27 DIAGNOSIS — Z91040 Latex allergy status: Secondary | ICD-10-CM | POA: Diagnosis not present

## 2015-02-27 DIAGNOSIS — Z7982 Long term (current) use of aspirin: Secondary | ICD-10-CM | POA: Insufficient documentation

## 2015-02-27 DIAGNOSIS — E119 Type 2 diabetes mellitus without complications: Secondary | ICD-10-CM | POA: Insufficient documentation

## 2015-02-27 DIAGNOSIS — F329 Major depressive disorder, single episode, unspecified: Secondary | ICD-10-CM | POA: Diagnosis not present

## 2015-02-27 DIAGNOSIS — Z87891 Personal history of nicotine dependence: Secondary | ICD-10-CM | POA: Insufficient documentation

## 2015-02-27 DIAGNOSIS — J9620 Acute and chronic respiratory failure, unspecified whether with hypoxia or hypercapnia: Secondary | ICD-10-CM | POA: Diagnosis not present

## 2015-02-27 DIAGNOSIS — Z794 Long term (current) use of insulin: Secondary | ICD-10-CM | POA: Insufficient documentation

## 2015-02-27 DIAGNOSIS — I5030 Unspecified diastolic (congestive) heart failure: Secondary | ICD-10-CM | POA: Diagnosis not present

## 2015-02-27 DIAGNOSIS — R0602 Shortness of breath: Secondary | ICD-10-CM | POA: Diagnosis not present

## 2015-02-27 DIAGNOSIS — K219 Gastro-esophageal reflux disease without esophagitis: Secondary | ICD-10-CM | POA: Insufficient documentation

## 2015-02-27 DIAGNOSIS — E785 Hyperlipidemia, unspecified: Secondary | ICD-10-CM | POA: Insufficient documentation

## 2015-02-27 DIAGNOSIS — Z79899 Other long term (current) drug therapy: Secondary | ICD-10-CM | POA: Insufficient documentation

## 2015-02-27 MED ORDER — HYDROCODONE-ACETAMINOPHEN 5-325 MG PO TABS: 1.0000 | ORAL_TABLET | Freq: Four times a day (QID) | ORAL | Status: DC | PRN

## 2015-02-27 MED ORDER — LORAZEPAM 1 MG PO TABS
1.0000 mg | ORAL_TABLET | Freq: Once | ORAL | Status: AC
  Administered 2015-02-27: 1 mg via ORAL
  Filled 2015-02-27: qty 1

## 2015-02-27 MED ORDER — INDOMETHACIN 25 MG PO CAPS
50.0000 mg | ORAL_CAPSULE | Freq: Once | ORAL | Status: AC
  Administered 2015-02-27: 50 mg via ORAL
  Filled 2015-02-27: qty 2

## 2015-02-27 MED ORDER — OXYCODONE-ACETAMINOPHEN 5-325 MG PO TABS
1.0000 | ORAL_TABLET | Freq: Once | ORAL | Status: AC
  Administered 2015-02-27: 1 via ORAL
  Filled 2015-02-27: qty 1

## 2015-02-27 NOTE — ED Notes (Signed)
Pt from home via [facility] with c/o of worsening left foot pain x 1 week. Pt reports she did self foot and nail care approx 1 month ago.  PCP told pt to soak foot in epsom salt intermittently and pt had voltaren gel and started applying 1 week ago.  Pt reports she can't walk and the pain is causing increased SOB and anxiety. Pt in NAD, A&O.

## 2015-02-27 NOTE — Discharge Instructions (Signed)
Keep foot elevated. Ibuprofen for pain. Take norco for severe pain. Follow up with primary care doctor in 2-3 days if not improving.

## 2015-02-27 NOTE — ED Provider Notes (Signed)
CSN: [id number]     Arrival date & time 02/27/15  1131 History   First MD Initiated Contact with Patient 02/27/15 1134     Chief Complaint  Patient presents with  . Foot Pain     (Consider location/radiation/quality/duration/timing/severity/associated sxs/prior Treatment) HPI [name] is a 49 y.o. female with history of COPD and uncontrolled diabetes, multiple other medical problems, presents to emergency department complaining of left foot pain. Patient states her foot has been worsening over last week. States pain is "all over the foot and ankle. States it started in the big toe, but now radiates over the sole of the foot and into the ankle. Patient denies any swelling. There is no redness or lesions. There are no injuries. She has neuropathy and states she takes Neurontin for which she took with no improvement. She denies history of gout. He denies fever or chills. Patient denies any other complaints at this time.  Past Medical History  Diagnosis Date  . Hypertension   . Hyperlipidemia   . Chronic headache   . Fibromyalgia     daily narcotics  . Anxiety     hx chronic BZ use, stopped 07/2010  . Anemia   . Pulmonary sarcoidosis     unimpressive CT chest 2011  . Colonic polyp   . GERD (gastroesophageal reflux disease)   . ALLERGIC RHINITIS   . Asthma   . CHF (congestive heart failure)     Diastolic with fluid overload, May, 2012, LVEF 60%  . Morbid obesity   . Depression   . Panic attacks   . Diabetes mellitus   . COPD (chronic obstructive pulmonary disease)     on home O2, moderate airflow obstruction, suspect d/t emphysema  . Obesity   . Elevated LFTs 09/2011  . Ovarian cyst   . Chronic back pain   . Sleep apnea      CPAP  . Fatty liver   . Esophagitis   . Gastritis   . Internal hemorrhoids   . Adenomatous colon polyp 12/03/09    [facility] in [place], [place]   Past Surgical History  Procedure Laterality Date  . Polypectomy  2011  . Lumbar  microdiscectomy  07/06/2011    R L4-5, stern  . Back surgery    . Carpal tunnel release    . Steroid spinal injections    . Hysteroscopy w/d&c N/A 03/25/2013    Procedure: DILATATION AND CURETTAGE /HYSTEROSCOPY;  Surgeon: [name], MD;  Location: [facility];  Service: Gynecology;  Laterality: N/A;  . Dilation and curettage of uterus     Family History  Problem Relation Age of Onset  . Hypertension Mother   . Heart disease Mother   . Emphysema Father   . Hypertension Father   . Stomach cancer Father   . Heart disease Father   . Allergies Brother   . Stomach cancer Brother   . Congestive Heart Failure Brother   . Diabetes type II Brother   . Heart disease Sister   . Diabetes type II Sister   . Heart attack Sister   . Stroke Sister   . Hypertension Sister   . Heart failure Sister     diastolic  . Hypertension Sister    History  Substance Use Topics  . Smoking status: Former Smoker -- 0.25 packs/day for 29 years    Types: Cigarettes    Quit date: 11/24/2014  . Smokeless tobacco: Never Used  . Alcohol Use: 0.0 oz/week  0 Standard drinks or equivalent per week     Comment: occasionally   OB History    No data available     Review of Systems  Constitutional: Negative for fever and chills.  Respiratory: Negative for cough, chest tightness and shortness of breath.   Cardiovascular: Negative for chest pain, palpitations and leg swelling.  Gastrointestinal: Negative for nausea, vomiting, abdominal pain and diarrhea.  Musculoskeletal: Positive for arthralgias. Negative for myalgias, joint swelling, neck pain and neck stiffness.  Skin: Negative for rash.  Neurological: Negative for dizziness, weakness, numbness and headaches.  All other systems reviewed and are negative.     Allergies  Simvastatin; Sulfamethoxazole-trimethoprim; Zolpidem tartrate; Azithromycin; Ceftin; Doxycycline; Latex; Metformin and related; Metolazone; Metronidazole; Ciprofloxacin; and  Tramadol  Home Medications   Prior to Admission medications   Medication Sig Start Date End Date Taking? Authorizing Provider  albuterol (PROAIR HFA) 108 (90 BASE) MCG/ACT inhaler Inhale 2 puffs into the lungs every 6 (six) hours as needed for wheezing or shortness of breath. 02/23/15   Rowe Clack, MD  albuterol (PROVENTIL HFA;VENTOLIN HFA) 108 (90 BASE) MCG/ACT inhaler Inhale 2 puffs into the lungs every 6 (six) hours as needed for wheezing or shortness of breath.    Historical Provider, MD  albuterol (PROVENTIL) (2.5 MG/3ML) 0.083% nebulizer solution Take 3 mLs (2.5 mg total) by nebulization every 6 (six) hours as needed for wheezing or shortness of breath. 12/08/14   Rowe Clack, MD  ALPRAZolam Duanne Moron) 1 MG tablet Take 1 tablet (1 mg total) by mouth 3 (three) times daily. 01/25/15   Rowe Clack, MD  aspirin EC 81 MG tablet Take 81 mg by mouth daily.    Historical Provider, MD  BIOTIN PO Take 1 capsule by mouth daily.    Historical Provider, MD  budesonide-formoterol (SYMBICORT) 160-4.5 MCG/ACT inhaler Inhale 2 puffs into the lungs 2 (two) times daily.    Historical Provider, MD  cetirizine (ZYRTEC) 1 MG/ML syrup Take 10 mLs (10 mg total) by mouth at bedtime. 01/06/15   Rowe Clack, MD  Coenzyme Q10 (CO Q 10) 10 MG CAPS Take 1 capsule by mouth daily.    Historical Provider, MD  diclofenac sodium (VOLTAREN) 1 % GEL Apply 4 g topically 4 (four) times daily. 12/18/14   Debbe Odea, MD  esomeprazole (NEXIUM) 40 MG capsule Take 40 mg by mouth 2 (two) times daily before a meal.    Historical Provider, MD  fluconazole (DIFLUCAN) 150 MG tablet Take 1 tablet (150 mg total) by mouth once. 01/25/15   Rowe Clack, MD  fluticasone (FLONASE) 50 MCG/ACT nasal spray Place 2 sprays into both nostrils daily. 10/19/14   Rowe Clack, MD  gabapentin (NEURONTIN) 300 MG capsule Take 300 mg by mouth at bedtime.    Historical Provider, MD  glimepiride (AMARYL) 1 MG tablet Take 1 mg by  mouth daily with breakfast.    Historical Provider, MD  glucose blood (ONE TOUCH ULTRA TEST) test strip Use to check blood sugars twice a day Dx E11.9 01/12/15   Rowe Clack, MD  hydrOXYzine (ATARAX/VISTARIL) 10 MG tablet TAKE 1 TABLET BY MOUTH THREE TIMES DAILY AS NEEDED FOR ITCHING 08/27/14   Biagio Borg, MD  hydrOXYzine (ATARAX/VISTARIL) 10 MG tablet Take 10 mg by mouth 3 (three) times daily as needed for itching.    Historical Provider, MD  insulin aspart (NOVOLOG FLEXPEN) 100 UNIT/ML FlexPen Inject 5 Units into the skin 3 (three) times daily with meals.  12/28/14   Shanker Kristeen Mans, MD  Insulin Glargine (LANTUS SOLOSTAR) 100 UNIT/ML Solostar Pen Inject 40 Units into the skin 2 (two) times daily. 01/13/15   Rowe Clack, MD  Multiple Vitamin (MULTIVITAMIN) capsule Take 1 capsule by mouth daily.    Historical Provider, MD  nystatin (MYCOSTATIN) 100000 UNITS/ML SUSP Take 1 mL by mouth 3 (three) times daily. Swish solution then swallow. 01/04/15   Rowe Clack, MD  omega-3 fish oil (MAXEPA) 1000 MG CAPS capsule Take 1 capsule by mouth daily.    Historical Provider, MD  potassium chloride SA (K-DUR,KLOR-CON) 20 MEQ tablet Take 3 tablets (60 mEq total) by mouth 2 (two) times daily. Patient taking differently: Take 40 mEq by mouth 2 (two) times daily.  01/12/15   Liliane Shi, PA-C  promethazine (PHENERGAN) 25 MG tablet Take 1 tablet (25 mg total) by mouth every 6 (six) hours as needed for nausea or vomiting. for nausea 01/11/15   Rowe Clack, MD  SPIRIVA HANDIHALER 18 MCG inhalation capsule INHALE THE CONTENTS OF 1 CAPSULE VIA INHALATION DEVICE EVERY DAY 01/14/15   Kathee Delton, MD  spironolactone (ALDACTONE) 25 MG tablet Take 25 mg by mouth 2 (two) times daily.    Historical Provider, MD  tiZANidine (ZANAFLEX) 4 MG tablet Take 1 tablet (4 mg total) by mouth every 8 (eight) hours as needed for muscle spasms. 02/09/15   Tresa Garter, MD  torsemide (DEMADEX) 100 MG tablet  Take 1 tablet (100 mg total) by mouth 2 (two) times daily. 07/30/14   Biagio Borg, MD  varenicline (CHANTIX) 0.5 MG tablet Take 1 tablet (0.5 mg total) by mouth 2 (two) times daily. 02/08/15   Rowe Clack, MD  venlafaxine XR (EFFEXOR XR) 37.5 MG 24 hr capsule Take 1 capsule (37.5 mg total) by mouth daily with breakfast. 11/13/14   Rowe Clack, MD   BP 119/67 mmHg  Pulse 95  Temp(Src) 98.5 F (36.9 C) (Oral)  Resp 23  SpO2 96%  LMP 10/16/2012 Physical Exam  Constitutional: She appears well-developed and well-nourished. No distress.  HENT:  Head: Normocephalic.  Eyes: Conjunctivae are normal.  Neck: Neck supple.  Cardiovascular: Normal rate, regular rhythm and normal heart sounds.   Pulmonary/Chest: Effort normal and breath sounds normal. No respiratory distress. She has no wheezes. She has no rales.  Abdominal: Soft. Bowel sounds are normal. She exhibits no distension. There is no tenderness. There is no rebound.  Musculoskeletal: She exhibits no edema.  Normal appearing left foot. There is no erythema, no swelling, diffuse no deformity. Dorsal pedal pulses intact and equal bilaterally. Tender to palpation over left great toe diffusely, over medial first MTP joint, over plantar fascia, over bilateral malleoli of the ankle. Full range of motion of the ankle. There is no pain with flexion or extension of the left great toe, however his painful when it is medially or laterally deviated.  Neurological: She is alert.  Skin: Skin is warm and dry.  Psychiatric: She has a normal mood and affect. Her behavior is normal.  Nursing note and vitals reviewed.   ED Course  Procedures (including critical care time) Labs Review Labs Reviewed - No data to display  Imaging Review Dg Foot Complete Left  02/27/2015   CLINICAL DATA:  Left foot pain for 1 week.  EXAM: LEFT FOOT - COMPLETE 3+ VIEW  COMPARISON:  01/10/2012  FINDINGS: There is no acute fracture or dislocation or other acute  abnormality. Old healed  fracture of the proximal phalanx of the fourth toe. Minimal dorsal spurring at the first tarsal metatarsal joint.  IMPRESSION: No acute osseous abnormalities.   Electronically Signed   By: Lorriane Shire M.D.   On: 02/27/2015 13:06     EKG Interpretation None      MDM   Final diagnoses:  Left foot pain     1:25 PM X-ray of the foot is negative. Patient's foot has no evidence of infection at this time, her toes, toenails, skin between the toes is all intact with no swelling, drainage, lesions. There is no obvious swelling noted. Question gout versus tendinitis versus neuropathy. Plan to discharge home, will prescribe Norco for severe pain, Ace wrap provided, plan to follow with primary care doctor. She is afebrile, nontoxic appearing.  Filed Vitals:   02/27/15 1255 02/27/15 1300 02/27/15 1333 02/27/15 1400  BP: 112/76 119/67 119/68 124/81  Pulse: 95 95 98 100  Temp:      TempSrc:      Resp: 16 23 18  32  SpO2: 95% 96% 98% 100%     Jeannett Senior, PA-C 02/27/15 1509  Evelina Bucy, MD 02/27/15 [phone]

## 2015-02-28 DIAGNOSIS — J9620 Acute and chronic respiratory failure, unspecified whether with hypoxia or hypercapnia: Secondary | ICD-10-CM | POA: Diagnosis not present

## 2015-03-01 DIAGNOSIS — J9620 Acute and chronic respiratory failure, unspecified whether with hypoxia or hypercapnia: Secondary | ICD-10-CM | POA: Diagnosis not present

## 2015-03-02 DIAGNOSIS — M542 Cervicalgia: Secondary | ICD-10-CM | POA: Diagnosis not present

## 2015-03-02 DIAGNOSIS — J9620 Acute and chronic respiratory failure, unspecified whether with hypoxia or hypercapnia: Secondary | ICD-10-CM | POA: Diagnosis not present

## 2015-03-03 DIAGNOSIS — J9620 Acute and chronic respiratory failure, unspecified whether with hypoxia or hypercapnia: Secondary | ICD-10-CM | POA: Diagnosis not present

## 2015-03-04 DIAGNOSIS — J9620 Acute and chronic respiratory failure, unspecified whether with hypoxia or hypercapnia: Secondary | ICD-10-CM | POA: Diagnosis not present

## 2015-03-05 ENCOUNTER — Ambulatory Visit: Payer: Medicare Other | Admitting: Internal Medicine

## 2015-03-05 DIAGNOSIS — J9620 Acute and chronic respiratory failure, unspecified whether with hypoxia or hypercapnia: Secondary | ICD-10-CM | POA: Diagnosis not present

## 2015-03-06 DIAGNOSIS — J9620 Acute and chronic respiratory failure, unspecified whether with hypoxia or hypercapnia: Secondary | ICD-10-CM | POA: Diagnosis not present

## 2015-03-07 DIAGNOSIS — J9620 Acute and chronic respiratory failure, unspecified whether with hypoxia or hypercapnia: Secondary | ICD-10-CM | POA: Diagnosis not present

## 2015-03-08 ENCOUNTER — Encounter: Payer: Self-pay | Admitting: Internal Medicine

## 2015-03-08 DIAGNOSIS — R234 Changes in skin texture: Secondary | ICD-10-CM

## 2015-03-08 DIAGNOSIS — L923 Foreign body granuloma of the skin and subcutaneous tissue: Secondary | ICD-10-CM

## 2015-03-09 DIAGNOSIS — M545 Low back pain: Secondary | ICD-10-CM | POA: Diagnosis not present

## 2015-03-09 DIAGNOSIS — Z79899 Other long term (current) drug therapy: Secondary | ICD-10-CM | POA: Diagnosis not present

## 2015-03-09 DIAGNOSIS — M488X6 Other specified spondylopathies, lumbar region: Secondary | ICD-10-CM | POA: Diagnosis not present

## 2015-03-09 DIAGNOSIS — M47817 Spondylosis without myelopathy or radiculopathy, lumbosacral region: Secondary | ICD-10-CM | POA: Diagnosis not present

## 2015-03-09 DIAGNOSIS — M79606 Pain in leg, unspecified: Secondary | ICD-10-CM | POA: Diagnosis not present

## 2015-03-09 DIAGNOSIS — G894 Chronic pain syndrome: Secondary | ICD-10-CM | POA: Diagnosis not present

## 2015-03-15 ENCOUNTER — Encounter (HOSPITAL_COMMUNITY): Payer: Self-pay | Admitting: *Deleted

## 2015-03-15 ENCOUNTER — Inpatient Hospital Stay (HOSPITAL_COMMUNITY)
Admission: EM | Admit: 2015-03-15 | Discharge: 2015-03-21 | DRG: 190 | Disposition: A | Discharge status: Home Health | Payer: Medicare Other | Attending: Internal Medicine | Admitting: Internal Medicine

## 2015-03-15 ENCOUNTER — Emergency Department (HOSPITAL_COMMUNITY): Payer: Medicare Other

## 2015-03-15 ENCOUNTER — Other Ambulatory Visit: Payer: Self-pay

## 2015-03-15 ENCOUNTER — Ambulatory Visit: Payer: Self-pay | Admitting: Adult Health

## 2015-03-15 DIAGNOSIS — R519 Headache, unspecified: Secondary | ICD-10-CM

## 2015-03-15 DIAGNOSIS — F410 Panic disorder [episodic paroxysmal anxiety] without agoraphobia: Secondary | ICD-10-CM | POA: Diagnosis not present

## 2015-03-15 DIAGNOSIS — F1290 Cannabis use, unspecified, uncomplicated: Secondary | ICD-10-CM | POA: Diagnosis present

## 2015-03-15 DIAGNOSIS — J438 Other emphysema: Secondary | ICD-10-CM | POA: Diagnosis not present

## 2015-03-15 DIAGNOSIS — Z794 Long term (current) use of insulin: Secondary | ICD-10-CM | POA: Diagnosis not present

## 2015-03-15 DIAGNOSIS — F1490 Cocaine use, unspecified, uncomplicated: Secondary | ICD-10-CM | POA: Diagnosis present

## 2015-03-15 DIAGNOSIS — Z6841 Body Mass Index (BMI) 40.0 and over, adult: Secondary | ICD-10-CM | POA: Diagnosis not present

## 2015-03-15 DIAGNOSIS — J441 Chronic obstructive pulmonary disease with (acute) exacerbation: Secondary | ICD-10-CM | POA: Diagnosis not present

## 2015-03-15 DIAGNOSIS — Z9981 Dependence on supplemental oxygen: Secondary | ICD-10-CM | POA: Diagnosis not present

## 2015-03-15 DIAGNOSIS — E662 Morbid (severe) obesity with alveolar hypoventilation: Secondary | ICD-10-CM | POA: Diagnosis present

## 2015-03-15 DIAGNOSIS — J9611 Chronic respiratory failure with hypoxia: Secondary | ICD-10-CM | POA: Diagnosis not present

## 2015-03-15 DIAGNOSIS — D860 Sarcoidosis of lung: Secondary | ICD-10-CM | POA: Diagnosis not present

## 2015-03-15 DIAGNOSIS — R51 Headache: Secondary | ICD-10-CM | POA: Diagnosis not present

## 2015-03-15 DIAGNOSIS — Z8249 Family history of ischemic heart disease and other diseases of the circulatory system: Secondary | ICD-10-CM

## 2015-03-15 DIAGNOSIS — R079 Chest pain, unspecified: Secondary | ICD-10-CM | POA: Diagnosis not present

## 2015-03-15 DIAGNOSIS — I5033 Acute on chronic diastolic (congestive) heart failure: Secondary | ICD-10-CM

## 2015-03-15 DIAGNOSIS — J45909 Unspecified asthma, uncomplicated: Secondary | ICD-10-CM | POA: Diagnosis not present

## 2015-03-15 DIAGNOSIS — I10 Essential (primary) hypertension: Secondary | ICD-10-CM | POA: Diagnosis present

## 2015-03-15 DIAGNOSIS — Z825 Family history of asthma and other chronic lower respiratory diseases: Secondary | ICD-10-CM

## 2015-03-15 DIAGNOSIS — K219 Gastro-esophageal reflux disease without esophagitis: Secondary | ICD-10-CM | POA: Diagnosis present

## 2015-03-15 DIAGNOSIS — I272 Other secondary pulmonary hypertension: Secondary | ICD-10-CM | POA: Diagnosis not present

## 2015-03-15 DIAGNOSIS — R069 Unspecified abnormalities of breathing: Secondary | ICD-10-CM | POA: Diagnosis not present

## 2015-03-15 DIAGNOSIS — J9601 Acute respiratory failure with hypoxia: Secondary | ICD-10-CM | POA: Diagnosis not present

## 2015-03-15 DIAGNOSIS — J439 Emphysema, unspecified: Secondary | ICD-10-CM | POA: Diagnosis present

## 2015-03-15 DIAGNOSIS — Z91040 Latex allergy status: Secondary | ICD-10-CM

## 2015-03-15 DIAGNOSIS — Z79899 Other long term (current) drug therapy: Secondary | ICD-10-CM

## 2015-03-15 DIAGNOSIS — Z7982 Long term (current) use of aspirin: Secondary | ICD-10-CM

## 2015-03-15 DIAGNOSIS — Z823 Family history of stroke: Secondary | ICD-10-CM

## 2015-03-15 DIAGNOSIS — E1165 Type 2 diabetes mellitus with hyperglycemia: Secondary | ICD-10-CM | POA: Diagnosis not present

## 2015-03-15 DIAGNOSIS — Z87891 Personal history of nicotine dependence: Secondary | ICD-10-CM

## 2015-03-15 DIAGNOSIS — M797 Fibromyalgia: Secondary | ICD-10-CM | POA: Diagnosis not present

## 2015-03-15 DIAGNOSIS — E785 Hyperlipidemia, unspecified: Secondary | ICD-10-CM | POA: Diagnosis present

## 2015-03-15 DIAGNOSIS — Z881 Allergy status to other antibiotic agents status: Secondary | ICD-10-CM

## 2015-03-15 DIAGNOSIS — R062 Wheezing: Secondary | ICD-10-CM | POA: Diagnosis not present

## 2015-03-15 DIAGNOSIS — I5032 Chronic diastolic (congestive) heart failure: Secondary | ICD-10-CM | POA: Diagnosis not present

## 2015-03-15 DIAGNOSIS — J9610 Chronic respiratory failure, unspecified whether with hypoxia or hypercapnia: Secondary | ICD-10-CM | POA: Diagnosis present

## 2015-03-15 DIAGNOSIS — I270 Primary pulmonary hypertension: Secondary | ICD-10-CM | POA: Diagnosis not present

## 2015-03-15 DIAGNOSIS — Z888 Allergy status to other drugs, medicaments and biological substances status: Secondary | ICD-10-CM | POA: Diagnosis not present

## 2015-03-15 DIAGNOSIS — Z885 Allergy status to narcotic agent status: Secondary | ICD-10-CM | POA: Diagnosis not present

## 2015-03-15 DIAGNOSIS — E876 Hypokalemia: Secondary | ICD-10-CM | POA: Diagnosis not present

## 2015-03-15 DIAGNOSIS — Z833 Family history of diabetes mellitus: Secondary | ICD-10-CM

## 2015-03-15 DIAGNOSIS — R0602 Shortness of breath: Secondary | ICD-10-CM

## 2015-03-15 DIAGNOSIS — R7989 Other specified abnormal findings of blood chemistry: Secondary | ICD-10-CM | POA: Diagnosis present

## 2015-03-15 DIAGNOSIS — IMO0002 Reserved for concepts with insufficient information to code with codable children: Secondary | ICD-10-CM | POA: Diagnosis present

## 2015-03-15 LAB — MAGNESIUM: MAGNESIUM: 1.4 mg/dL — AB (ref 1.7–2.4)

## 2015-03-15 LAB — BASIC METABOLIC PANEL
ANION GAP: 12 (ref 5–15)
BUN: 8 mg/dL (ref 6–20)
CHLORIDE: 95 mmol/L — AB (ref 101–111)
CO2: 29 mmol/L (ref 22–32)
Calcium: 8.7 mg/dL — ABNORMAL LOW (ref 8.9–10.3)
Creatinine, Ser: 1.12 mg/dL — ABNORMAL HIGH (ref 0.44–1.00)
GFR calc Af Amer: >60 mL/min (ref >60)
GFR calc non Af Amer: 57 mL/min — ABNORMAL LOW (ref >60)
GLUCOSE: 162 mg/dL — AB (ref 65–99)
POTASSIUM: 3.3 mmol/L — AB (ref 3.5–5.1)
SODIUM: 136 mmol/L (ref 135–145)

## 2015-03-15 LAB — CBC
HCT: 42.4 % (ref 36.0–46.0)
HEMOGLOBIN: 13.9 g/dL (ref 12.0–15.0)
MCH: 25.1 pg — AB (ref 26.0–34.0)
MCHC: 32.8 g/dL (ref 30.0–36.0)
MCV: 76.7 fL — AB (ref 78.0–100.0)
PLATELETS: 190 10*3/uL (ref 150–400)
RBC: 5.53 MIL/uL — ABNORMAL HIGH (ref 3.87–5.11)
RDW: 15.9 % — ABNORMAL HIGH (ref 11.5–15.5)
WBC: 6.6 10*3/uL (ref 4.0–10.5)

## 2015-03-15 LAB — I-STAT TROPONIN, ED: TROPONIN I, POC: 0 ng/mL (ref 0.00–0.08)

## 2015-03-15 MED ORDER — MORPHINE SULFATE 4 MG/ML IJ SOLN
4.0000 mg | Freq: Once | INTRAMUSCULAR | Status: AC
  Administered 2015-03-15: 4 mg via INTRAVENOUS
  Filled 2015-03-15: qty 1

## 2015-03-15 MED ORDER — METHYLPREDNISOLONE SODIUM SUCC 125 MG IJ SOLR
125.0000 mg | Freq: Once | INTRAMUSCULAR | Status: AC
  Administered 2015-03-15: 125 mg via INTRAVENOUS
  Filled 2015-03-15: qty 2

## 2015-03-15 MED ORDER — IPRATROPIUM BROMIDE 0.02 % IN SOLN
1.0000 mg | Freq: Once | RESPIRATORY_TRACT | Status: AC
  Administered 2015-03-15: 1 mg via RESPIRATORY_TRACT
  Filled 2015-03-15: qty 5

## 2015-03-15 MED ORDER — ONDANSETRON HCL 4 MG/2ML IJ SOLN
4.0000 mg | Freq: Once | INTRAMUSCULAR | Status: AC
  Administered 2015-03-15: 4 mg via INTRAVENOUS
  Filled 2015-03-15: qty 2

## 2015-03-15 MED ORDER — ASPIRIN 81 MG PO CHEW
324.0000 mg | CHEWABLE_TABLET | Freq: Once | ORAL | Status: AC
  Administered 2015-03-15: 324 mg via ORAL
  Filled 2015-03-15: qty 4

## 2015-03-15 MED ORDER — POTASSIUM CHLORIDE CRYS ER 20 MEQ PO TBCR
40.0000 meq | EXTENDED_RELEASE_TABLET | Freq: Once | ORAL | Status: AC
  Administered 2015-03-15: 40 meq via ORAL
  Filled 2015-03-15: qty 2

## 2015-03-15 MED ORDER — ALBUTEROL (5 MG/ML) CONTINUOUS INHALATION SOLN
10.0000 mg/h | INHALATION_SOLUTION | RESPIRATORY_TRACT | Status: AC
  Administered 2015-03-15: 10 mg/h via RESPIRATORY_TRACT
  Filled 2015-03-15: qty 20

## 2015-03-15 NOTE — ED Provider Notes (Signed)
Medical screening examination/treatment/procedure(s) were conducted as a shared visit with non-physician practitioner(s) and myself.  I personally evaluated the patient during the encounter.  Patient here with shortness of breath and wheezing. On exam she has InstaCare as well as X Torre wheezing noted. Likely this is exacerbation of her known history of sarcoid versus COPD. Will also rule out pneumonia. Patient likely to be admitted ED ECG REPORT   Date: 03/15/2015  Rate: 98  Rhythm: normal sinus rhythm  QRS Axis: normal  Intervals: normal  ST/T Wave abnormalities: normal  Conduction Disutrbances:none  Narrative Interpretation:   Old EKG Reviewed: none available  I have personally reviewed the EKG tracing and agree with the computerized printout as noted.   Lacretia Leigh, MD 03/15/15 2051

## 2015-03-15 NOTE — ED Provider Notes (Signed)
CSN: [id number]     Arrival date & time 03/15/15  1929 History   First MD Initiated Contact with Patient 03/15/15 1937     Chief Complaint  Patient presents with  . Shortness of Breath     (Consider location/radiation/quality/duration/timing/severity/associated sxs/prior Treatment) Patient is a 49 y.o. female presenting with shortness of breath. The history is provided by the patient and medical records. No language interpreter was used.  Shortness of Breath Associated symptoms: chest pain   Associated symptoms: no abdominal pain, no cough, no diaphoresis, no fever, no headaches, no rash, no vomiting and no wheezing      [name] is a 49 y.o. female  with a hx of HTN, fibromyalgia, anxiety, pulmonary sarcoidosis, GERD, asthma, CHF, obesity, depression, NIDDM, chronic back pain presents to the Emergency Department complaining of gradual, persistent, progressively worsening SOB onset yesterday.  Pt reports this feels like her COPD flares.  She has been using albuterol without relief at home.  She reports a HX of pulmonary hypertension and has been referred to the CHF clinic, but has not yet begun her treatment there. Pt reports she feels as if she is breathing through a straw. She reports she wears oxygen all the time at home  (3L at rest and 4L with activity) and this usually alleviates her SOB. Pt reported chest discomfort after moving from the EMS stretcher to the ED bed, but has not previously had CP with this exacerbation.  Pt denies fever, chills, headache, neck pain, abd pain, N/V/D, weakness, dizziness, syncope, dysuria.     PCP: Rowe Clack, MD  Past Medical History  Diagnosis Date  . Hypertension   . Hyperlipidemia   . Chronic headache   . Fibromyalgia     daily narcotics  . Anxiety     hx chronic BZ use, stopped 07/2010  . Anemia   . Pulmonary sarcoidosis     unimpressive CT chest 2011  . Colonic polyp   . GERD (gastroesophageal reflux disease)   . ALLERGIC  RHINITIS   . Asthma   . CHF (congestive heart failure)     Diastolic with fluid overload, May, 2012, LVEF 60%  . Morbid obesity   . Depression   . Panic attacks   . Diabetes mellitus   . COPD (chronic obstructive pulmonary disease)     on home O2, moderate airflow obstruction, suspect d/t emphysema  . Obesity   . Elevated LFTs 09/2011  . Ovarian cyst   . Chronic back pain   . Sleep apnea      CPAP  . Fatty liver   . Esophagitis   . Gastritis   . Internal hemorrhoids   . Adenomatous colon polyp 12/03/09    [facility] in [place], [place]   Past Surgical History  Procedure Laterality Date  . Polypectomy  2011  . Lumbar microdiscectomy  07/06/2011    R L4-5, stern  . Back surgery    . Carpal tunnel release    . Steroid spinal injections    . Hysteroscopy w/d&c N/A 03/25/2013    Procedure: DILATATION AND CURETTAGE /HYSTEROSCOPY;  Surgeon: [name], MD;  Location: [facility];  Service: Gynecology;  Laterality: N/A;  . Dilation and curettage of uterus     Family History  Problem Relation Age of Onset  . Hypertension Mother   . Heart disease Mother   . Emphysema Father   . Hypertension Father   . Stomach cancer Father   . Heart disease Father   .  Allergies Brother   . Stomach cancer Brother   . Congestive Heart Failure Brother   . Diabetes type II Brother   . Heart disease Sister   . Diabetes type II Sister   . Heart attack Sister   . Stroke Sister   . Hypertension Sister   . Heart failure Sister     diastolic  . Hypertension Sister    History  Substance Use Topics  . Smoking status: Former Smoker -- 0.25 packs/day for 29 years    Types: Cigarettes    Quit date: 11/24/2014  . Smokeless tobacco: Never Used  . Alcohol Use: 0.0 oz/week    0 Standard drinks or equivalent per week     Comment: occasionally   OB History    No data available     Review of Systems  Constitutional: Negative for fever, diaphoresis, appetite change, fatigue and unexpected weight  change.  HENT: Negative for mouth sores.   Eyes: Negative for visual disturbance.  Respiratory: Positive for shortness of breath. Negative for cough, chest tightness and wheezing.   Cardiovascular: Positive for chest pain.  Gastrointestinal: Negative for nausea, vomiting, abdominal pain, diarrhea and constipation.  Endocrine: Negative for polydipsia, polyphagia and polyuria.  Genitourinary: Negative for dysuria, urgency, frequency and hematuria.  Musculoskeletal: Negative for back pain and neck stiffness.  Skin: Negative for rash.  Allergic/Immunologic: Negative for immunocompromised state.  Neurological: Negative for syncope, light-headedness and headaches.  Hematological: Does not bruise/bleed easily.  Psychiatric/Behavioral: Negative for sleep disturbance. The patient is not nervous/anxious.       Allergies  Simvastatin; Sulfamethoxazole-trimethoprim; Zolpidem tartrate; Azithromycin; Ceftin; Doxycycline; Latex; Metformin and related; Metolazone; Metronidazole; Ciprofloxacin; and Tramadol  Home Medications   Prior to Admission medications   Medication Sig Start Date End Date Taking? Authorizing Provider  albuterol (PROAIR HFA) 108 (90 BASE) MCG/ACT inhaler Inhale 2 puffs into the lungs every 6 (six) hours as needed for wheezing or shortness of breath. 02/23/15  Yes Rowe Clack, MD  albuterol (PROVENTIL) (2.5 MG/3ML) 0.083% nebulizer solution Take 3 mLs (2.5 mg total) by nebulization every 6 (six) hours as needed for wheezing or shortness of breath. 12/08/14  Yes Rowe Clack, MD  ALPRAZolam Duanne Moron) 1 MG tablet Take 1 tablet (1 mg total) by mouth 3 (three) times daily. 01/25/15  Yes Rowe Clack, MD  aspirin EC 81 MG tablet Take 81 mg by mouth daily.   Yes Historical Provider, MD  BIOTIN PO Take 1 capsule by mouth daily.   Yes Historical Provider, MD  budesonide-formoterol (SYMBICORT) 160-4.5 MCG/ACT inhaler Inhale 2 puffs into the lungs 2 (two) times daily.   Yes  Historical Provider, MD  cetirizine (ZYRTEC) 1 MG/ML syrup Take 10 mLs (10 mg total) by mouth at bedtime. 01/06/15  Yes Rowe Clack, MD  diclofenac sodium (VOLTAREN) 1 % GEL Apply 4 g topically 4 (four) times daily. Patient taking differently: Apply 4 g topically 4 (four) times daily as needed (arthritis pain).  12/18/14  Yes Debbe Odea, MD  EMBEDA 20-0.8 MG CPCR Take 1 tablet by mouth daily. 03/09/15  Yes Historical Provider, MD  esomeprazole (NEXIUM) 40 MG capsule Take 40 mg by mouth 2 (two) times daily before a meal.   Yes Historical Provider, MD  fluticasone (FLONASE) 50 MCG/ACT nasal spray Place 2 sprays into both nostrils daily. Patient taking differently: Place 2 sprays into both nostrils daily as needed for allergies or rhinitis.  10/19/14  Yes Rowe Clack, MD  gabapentin (NEURONTIN) 300  MG capsule Take 300 mg by mouth daily as needed (neuropathy pain).    Yes Historical Provider, MD  glimepiride (AMARYL) 1 MG tablet Take 1 mg by mouth daily with breakfast.   Yes Historical Provider, MD  hydrOXYzine (ATARAX/VISTARIL) 10 MG tablet TAKE 1 TABLET BY MOUTH THREE TIMES DAILY AS NEEDED FOR ITCHING 08/27/14  Yes Biagio Borg, MD  insulin aspart (NOVOLOG FLEXPEN) 100 UNIT/ML FlexPen Inject 5 Units into the skin 3 (three) times daily with meals. 12/28/14  Yes Shanker Kristeen Mans, MD  Insulin Glargine (LANTUS SOLOSTAR) 100 UNIT/ML Solostar Pen Inject 40 Units into the skin 2 (two) times daily. 01/13/15  Yes Rowe Clack, MD  Multiple Vitamin (MULTIVITAMIN) capsule Take 1 capsule by mouth once a week.    Yes Historical Provider, MD  omega-3 fish oil (MAXEPA) 1000 MG CAPS capsule Take 1 capsule by mouth daily.   Yes Historical Provider, MD  potassium chloride SA (K-DUR,KLOR-CON) 20 MEQ tablet Take 3 tablets (60 mEq total) by mouth 2 (two) times daily. Patient taking differently: Take 40 mEq by mouth 2 (two) times daily.  01/12/15  Yes Scott Joylene Draft, PA-C  promethazine (PHENERGAN) 25 MG  tablet Take 1 tablet (25 mg total) by mouth every 6 (six) hours as needed for nausea or vomiting. for nausea 01/11/15  Yes Rowe Clack, MD  SPIRIVA HANDIHALER 18 MCG inhalation capsule INHALE THE CONTENTS OF 1 CAPSULE VIA INHALATION DEVICE EVERY DAY 01/14/15  Yes Kathee Delton, MD  spironolactone (ALDACTONE) 25 MG tablet Take 25 mg by mouth 2 (two) times daily.   Yes Historical Provider, MD  tiZANidine (ZANAFLEX) 2 MG tablet Take 2 mg by mouth 2 (two) times daily as needed for muscle spasms.  03/10/15  Yes Historical Provider, MD  torsemide (DEMADEX) 100 MG tablet Take 1 tablet (100 mg total) by mouth 2 (two) times daily. 07/30/14  Yes Biagio Borg, MD  varenicline (CHANTIX) 0.5 MG tablet Take 1 tablet (0.5 mg total) by mouth 2 (two) times daily. 02/08/15  Yes Rowe Clack, MD  venlafaxine XR (EFFEXOR XR) 37.5 MG 24 hr capsule Take 1 capsule (37.5 mg total) by mouth daily with breakfast. 11/13/14  Yes Rowe Clack, MD  fluconazole (DIFLUCAN) 150 MG tablet Take 1 tablet (150 mg total) by mouth once. Patient not taking: Reported on 03/15/2015 01/25/15   Rowe Clack, MD  HYDROcodone-acetaminophen (NORCO) 5-325 MG per tablet Take 1-2 tablets by mouth every 6 (six) hours as needed for moderate pain. Patient not taking: Reported on 03/15/2015 02/27/15   Tatyana Kirichenko, PA-C  nystatin (MYCOSTATIN) 100000 UNITS/ML SUSP Take 1 mL by mouth 3 (three) times daily. Swish solution then swallow. Patient not taking: Reported on 03/15/2015 01/04/15   Rowe Clack, MD  tiZANidine (ZANAFLEX) 4 MG tablet Take 1 tablet (4 mg total) by mouth every 8 (eight) hours as needed for muscle spasms. Patient not taking: Reported on 03/15/2015 02/09/15   Tresa Garter, MD   BP 121/64 mmHg  Pulse 96  Temp(Src) 98.1 F (36.7 C) (Oral)  Resp 14  Ht 5\' 8"  (1.727 m)  Wt 291 lb 1 oz (132.025 kg)  BMI 44.27 kg/m2  SpO2 100%  LMP 10/16/2012 Physical Exam  Constitutional: She is oriented to person,  place, and time. She appears well-developed and well-nourished. No distress.  HENT:  Head: Normocephalic and atraumatic.  Right Ear: Tympanic membrane, external ear and ear canal normal.  Left Ear: Tympanic membrane, external ear and  ear canal normal.  Nose: No mucosal edema or rhinorrhea. No epistaxis. Right sinus exhibits no maxillary sinus tenderness and no frontal sinus tenderness. Left sinus exhibits no maxillary sinus tenderness and no frontal sinus tenderness.  Mouth/Throat: Uvula is midline and mucous membranes are normal. Mucous membranes are not pale and not cyanotic. No oropharyngeal exudate, posterior oropharyngeal edema, posterior oropharyngeal erythema or tonsillar abscesses.  Eyes: Conjunctivae are normal. Pupils are equal, round, and reactive to light.  Neck: Normal range of motion and full passive range of motion without pain.  Cardiovascular: Normal rate and intact distal pulses.   Pulmonary/Chest: Accessory muscle usage present. No stridor. Tachypnea noted. She is in respiratory distress. She has decreased breath sounds.  Decreased breath sounds throughout without focal wheezes, rhonchi, rales Pt with tachypnea and pursed lip breathing Accessory muscle usage   Abdominal: Soft. Bowel sounds are normal. There is no tenderness.  Musculoskeletal: Normal range of motion.  Lymphadenopathy:    She has no cervical adenopathy.  Neurological: She is alert and oriented to person, place, and time.  Skin: Skin is warm and dry. No rash noted. She is not diaphoretic.  Psychiatric: She has a normal mood and affect.  Nursing note and vitals reviewed.   ED Course  Procedures (including critical care time) Labs Review Labs Reviewed  CBC - Abnormal; Notable for the following:    RBC 5.53 (*)    MCV 76.7 (*)    MCH 25.1 (*)    RDW 15.9 (*)    All other components within normal limits  BASIC METABOLIC PANEL - Abnormal; Notable for the following:    Potassium 3.3 (*)    Chloride 95  (*)    Glucose, Bld 162 (*)    Creatinine, Ser 1.12 (*)    Calcium 8.7 (*)    GFR calc non Af Amer 57 (*)    All other components within normal limits  MAGNESIUM - Abnormal; Notable for the following:    Magnesium 1.4 (*)    All other components within normal limits  D-DIMER, QUANTITATIVE (NOT AT [facility]) - Abnormal; Notable for the following:    D-Dimer, Quant 1.78 (*)    All other components within normal limits  BRAIN NATRIURETIC PEPTIDE  I-STAT TROPOININ, ED    Imaging Review Dg Chest Portable 1 View  03/15/2015   CLINICAL DATA:  Shortness of breath, left chest pain ongoing for 2 days.  EXAM: PORTABLE CHEST - 1 VIEW  COMPARISON:  01/14/2015  FINDINGS: There is mild cardiomegaly. Bibasilar airspace opacities have increased since prior study. No effusions. No acute bony abnormality.  IMPRESSION: Increasing bibasilar opacities. There may be underlying chronic scarring. Cannot exclude superimposed edema or infection.   Electronically Signed   By: Rolm Baptise M.D.   On: 03/15/2015 21:06     EKG Interpretation None       ED ECG REPORT  Date: 03/15/2015 Rate: 98 Rhythm: normal sinus rhythm QRS Axis: normal Intervals: normal ST/T Wave abnormalities: normal Conduction Disutrbances:none Narrative Interpretation:  Old EKG Reviewed: none available  MDM   Final diagnoses:  SOB (shortness of breath)  Wheezing  COPD exacerbation  On home oxygen therapy  Hypokalemia  Elevated d-dimer   Noura C Kievit presents with SOB.  Hx of COPD, asthma and sarcoidosis.  Pt also with hx of CHF.  Pt given albuterol x2 via EMS with some relief.  Will give steroids, albuterol, atrovent and begin work-up here.    10:00PM Re-evaluation during albuterol treatment reveals wheezing  throughout and less accessory muscle usage.  Pt reports the pain in her chest is decreasing and she is breathing better but c/o mild nausea and continued CP with coughing and moving.    12:42 AM Pt with  elevated d-dimer.  Will obtain CT angio.  Pt with normal troponin and normal ECG.    BP 121/64 mmHg  Pulse 96  Temp(Src) 98.1 F (36.7 C) (Oral)  Resp 14  Ht 5\' 8"  (1.727 m)  Wt 289 lb (131.09 kg)  BMI 43.95 kg/m2  SpO2 100%  LMP 10/16/2012  1:06 AM Pt is now wheezing more than earlier; will repeat Albuterol continuous neb and give magnesium.  Discussed with Dr. Posey Pronto who will admit to tele.  Pt has maintained good oxygen saturations with here home levels of O2.    The patient was discussed with and seen by Dr. Zenia Resides who agrees with the treatment plan.   Jarrett Soho Jaceon Heiberger, PA-C 03/16/15 0107

## 2015-03-15 NOTE — ED Notes (Signed)
Pt arrives from home via EMS c/o SOB since yesterday. Has had some recent Sarcadosis flare ups and pt states "this feels similar". Pt also c/o of some central chest tightness upon arrival to ER. Ems gave 5mg  of albuterol and 5mg  of atrovent. 96% 2L. CBG 221

## 2015-03-16 ENCOUNTER — Encounter (HOSPITAL_COMMUNITY): Payer: Self-pay | Admitting: *Deleted

## 2015-03-16 ENCOUNTER — Inpatient Hospital Stay (HOSPITAL_COMMUNITY): Payer: Medicare Other

## 2015-03-16 DIAGNOSIS — J441 Chronic obstructive pulmonary disease with (acute) exacerbation: Secondary | ICD-10-CM | POA: Diagnosis not present

## 2015-03-16 DIAGNOSIS — R791 Abnormal coagulation profile: Secondary | ICD-10-CM | POA: Diagnosis not present

## 2015-03-16 DIAGNOSIS — I10 Essential (primary) hypertension: Secondary | ICD-10-CM | POA: Diagnosis present

## 2015-03-16 DIAGNOSIS — E1165 Type 2 diabetes mellitus with hyperglycemia: Secondary | ICD-10-CM | POA: Diagnosis not present

## 2015-03-16 DIAGNOSIS — Z881 Allergy status to other antibiotic agents status: Secondary | ICD-10-CM | POA: Diagnosis not present

## 2015-03-16 DIAGNOSIS — E785 Hyperlipidemia, unspecified: Secondary | ICD-10-CM | POA: Diagnosis present

## 2015-03-16 DIAGNOSIS — K219 Gastro-esophageal reflux disease without esophagitis: Secondary | ICD-10-CM | POA: Diagnosis present

## 2015-03-16 DIAGNOSIS — Z87891 Personal history of nicotine dependence: Secondary | ICD-10-CM | POA: Diagnosis not present

## 2015-03-16 DIAGNOSIS — Z79899 Other long term (current) drug therapy: Secondary | ICD-10-CM | POA: Diagnosis not present

## 2015-03-16 DIAGNOSIS — E876 Hypokalemia: Secondary | ICD-10-CM | POA: Diagnosis present

## 2015-03-16 DIAGNOSIS — Z7982 Long term (current) use of aspirin: Secondary | ICD-10-CM | POA: Diagnosis not present

## 2015-03-16 DIAGNOSIS — I272 Other secondary pulmonary hypertension: Secondary | ICD-10-CM | POA: Diagnosis present

## 2015-03-16 DIAGNOSIS — I5032 Chronic diastolic (congestive) heart failure: Secondary | ICD-10-CM | POA: Diagnosis not present

## 2015-03-16 DIAGNOSIS — M797 Fibromyalgia: Secondary | ICD-10-CM | POA: Diagnosis present

## 2015-03-16 DIAGNOSIS — F1490 Cocaine use, unspecified, uncomplicated: Secondary | ICD-10-CM | POA: Diagnosis present

## 2015-03-16 DIAGNOSIS — Z888 Allergy status to other drugs, medicaments and biological substances status: Secondary | ICD-10-CM | POA: Diagnosis not present

## 2015-03-16 DIAGNOSIS — J438 Other emphysema: Secondary | ICD-10-CM | POA: Diagnosis not present

## 2015-03-16 DIAGNOSIS — Z794 Long term (current) use of insulin: Secondary | ICD-10-CM | POA: Diagnosis not present

## 2015-03-16 DIAGNOSIS — J9601 Acute respiratory failure with hypoxia: Secondary | ICD-10-CM

## 2015-03-16 DIAGNOSIS — Z823 Family history of stroke: Secondary | ICD-10-CM | POA: Diagnosis not present

## 2015-03-16 DIAGNOSIS — R51 Headache: Secondary | ICD-10-CM | POA: Diagnosis not present

## 2015-03-16 DIAGNOSIS — E662 Morbid (severe) obesity with alveolar hypoventilation: Secondary | ICD-10-CM | POA: Diagnosis not present

## 2015-03-16 DIAGNOSIS — Z8249 Family history of ischemic heart disease and other diseases of the circulatory system: Secondary | ICD-10-CM | POA: Diagnosis not present

## 2015-03-16 DIAGNOSIS — I270 Primary pulmonary hypertension: Secondary | ICD-10-CM | POA: Diagnosis not present

## 2015-03-16 DIAGNOSIS — F410 Panic disorder [episodic paroxysmal anxiety] without agoraphobia: Secondary | ICD-10-CM | POA: Diagnosis present

## 2015-03-16 DIAGNOSIS — J432 Centrilobular emphysema: Secondary | ICD-10-CM | POA: Diagnosis not present

## 2015-03-16 DIAGNOSIS — J9819 Other pulmonary collapse: Secondary | ICD-10-CM | POA: Diagnosis not present

## 2015-03-16 DIAGNOSIS — Z825 Family history of asthma and other chronic lower respiratory diseases: Secondary | ICD-10-CM | POA: Diagnosis not present

## 2015-03-16 DIAGNOSIS — Z885 Allergy status to narcotic agent status: Secondary | ICD-10-CM | POA: Diagnosis not present

## 2015-03-16 DIAGNOSIS — J45909 Unspecified asthma, uncomplicated: Secondary | ICD-10-CM | POA: Diagnosis present

## 2015-03-16 DIAGNOSIS — Z6841 Body Mass Index (BMI) 40.0 and over, adult: Secondary | ICD-10-CM | POA: Diagnosis not present

## 2015-03-16 DIAGNOSIS — J9611 Chronic respiratory failure with hypoxia: Secondary | ICD-10-CM

## 2015-03-16 DIAGNOSIS — Z833 Family history of diabetes mellitus: Secondary | ICD-10-CM | POA: Diagnosis not present

## 2015-03-16 DIAGNOSIS — I5033 Acute on chronic diastolic (congestive) heart failure: Secondary | ICD-10-CM | POA: Diagnosis not present

## 2015-03-16 DIAGNOSIS — F1290 Cannabis use, unspecified, uncomplicated: Secondary | ICD-10-CM | POA: Diagnosis present

## 2015-03-16 DIAGNOSIS — Z91040 Latex allergy status: Secondary | ICD-10-CM | POA: Diagnosis not present

## 2015-03-16 DIAGNOSIS — R0602 Shortness of breath: Secondary | ICD-10-CM | POA: Diagnosis present

## 2015-03-16 DIAGNOSIS — Z9981 Dependence on supplemental oxygen: Secondary | ICD-10-CM | POA: Diagnosis not present

## 2015-03-16 DIAGNOSIS — D860 Sarcoidosis of lung: Secondary | ICD-10-CM | POA: Diagnosis present

## 2015-03-16 LAB — I-STAT ARTERIAL BLOOD GAS, ED
ACID-BASE DEFICIT: 2 mmol/L (ref 0.0–2.0)
BICARBONATE: 24.5 meq/L — AB (ref 20.0–24.0)
O2 SAT: 92 %
Patient temperature: 96.7
TCO2: 26 mmol/L (ref 0–100)
pCO2 arterial: 45.5 mmHg — ABNORMAL HIGH (ref 35.0–45.0)
pH, Arterial: 7.334 — ABNORMAL LOW (ref 7.350–7.450)
pO2, Arterial: 66 mmHg — ABNORMAL LOW (ref 80.0–100.0)

## 2015-03-16 LAB — COMPREHENSIVE METABOLIC PANEL
ALBUMIN: 3.6 g/dL (ref 3.5–5.0)
ALT: 29 U/L (ref 14–54)
ANION GAP: 15 (ref 5–15)
AST: 34 U/L (ref 15–41)
Alkaline Phosphatase: 73 U/L (ref 38–126)
BILIRUBIN TOTAL: 0.7 mg/dL (ref 0.3–1.2)
BUN: 11 mg/dL (ref 6–20)
CHLORIDE: 92 mmol/L — AB (ref 101–111)
CO2: 25 mmol/L (ref 22–32)
CREATININE: 1.25 mg/dL — AB (ref 0.44–1.00)
Calcium: 8.5 mg/dL — ABNORMAL LOW (ref 8.9–10.3)
GFR calc Af Amer: 58 mL/min — ABNORMAL LOW (ref >60)
GFR, EST NON AFRICAN AMERICAN: 50 mL/min — AB (ref >60)
Glucose, Bld: 338 mg/dL — ABNORMAL HIGH (ref 65–99)
Potassium: 4.1 mmol/L (ref 3.5–5.1)
Sodium: 132 mmol/L — ABNORMAL LOW (ref 135–145)
Total Protein: 7.9 g/dL (ref 6.5–8.1)

## 2015-03-16 LAB — CBC WITH DIFFERENTIAL/PLATELET
BASOS ABS: 0 10*3/uL (ref 0.0–0.1)
BASOS PCT: 0 % (ref 0–1)
EOS ABS: 0 10*3/uL (ref 0.0–0.7)
Eosinophils Relative: 0 % (ref 0–5)
HEMATOCRIT: 43 % (ref 36.0–46.0)
Hemoglobin: 13.6 g/dL (ref 12.0–15.0)
Lymphocytes Relative: 6 % — ABNORMAL LOW (ref 12–46)
Lymphs Abs: 0.4 10*3/uL — ABNORMAL LOW (ref 0.7–4.0)
MCH: 24.9 pg — ABNORMAL LOW (ref 26.0–34.0)
MCHC: 31.6 g/dL (ref 30.0–36.0)
MCV: 78.6 fL (ref 78.0–100.0)
MONOS PCT: 1 % — AB (ref 3–12)
Monocytes Absolute: 0 10*3/uL — ABNORMAL LOW (ref 0.1–1.0)
Neutro Abs: 6.4 10*3/uL (ref 1.7–7.7)
Neutrophils Relative %: 93 % — ABNORMAL HIGH (ref 43–77)
Platelets: 231 10*3/uL (ref 150–400)
RBC: 5.47 MIL/uL — ABNORMAL HIGH (ref 3.87–5.11)
RDW: 16.1 % — AB (ref 11.5–15.5)
WBC: 6.9 10*3/uL (ref 4.0–10.5)

## 2015-03-16 LAB — TROPONIN I
Troponin I: <0.03 ng/mL (ref <0.031)
Troponin I: <0.03 ng/mL (ref <0.031)

## 2015-03-16 LAB — INFLUENZA PANEL BY PCR (TYPE A & B)
H1N1 flu by pcr: NOT DETECTED
INFLAPCR: NEGATIVE
Influenza B By PCR: NEGATIVE

## 2015-03-16 LAB — MRSA PCR SCREENING: MRSA by PCR: NEGATIVE

## 2015-03-16 LAB — D-DIMER, QUANTITATIVE (NOT AT ARMC): D DIMER QUANT: 1.78 ug{FEU}/mL — AB (ref 0.00–0.48)

## 2015-03-16 LAB — GLUCOSE, CAPILLARY
GLUCOSE-CAPILLARY: 307 mg/dL — AB (ref 65–99)
Glucose-Capillary: 326 mg/dL — ABNORMAL HIGH (ref 65–99)
Glucose-Capillary: 346 mg/dL — ABNORMAL HIGH (ref 65–99)
Glucose-Capillary: 320 mg/dL — ABNORMAL HIGH (ref 65–99)
Glucose-Capillary: 355 mg/dL — ABNORMAL HIGH (ref 65–99)

## 2015-03-16 LAB — STREP PNEUMONIAE URINARY ANTIGEN: Strep Pneumo Urinary Antigen: NEGATIVE

## 2015-03-16 LAB — PROTIME-INR
INR: 1.13 (ref 0.00–1.49)
PROTHROMBIN TIME: 14.7 s (ref 11.6–15.2)

## 2015-03-16 MED ORDER — ASPIRIN EC 81 MG PO TBEC
81.0000 mg | DELAYED_RELEASE_TABLET | Freq: Every day | ORAL | Status: DC
Start: 2015-03-16 — End: 2015-03-16
  Administered 2015-03-16: 81 mg via ORAL
  Filled 2015-03-16: qty 1

## 2015-03-16 MED ORDER — METHYLPREDNISOLONE SODIUM SUCC 125 MG IJ SOLR
60.0000 mg | Freq: Four times a day (QID) | INTRAMUSCULAR | Status: DC
  Administered 2015-03-16 – 2015-03-18 (×10): 60 mg via INTRAVENOUS
  Filled 2015-03-16 (×5): qty 0.96
  Filled 2015-03-16: qty 2
  Filled 2015-03-16 (×6): qty 0.96
  Filled 2015-03-16: qty 2
  Filled 2015-03-16: qty 0.96

## 2015-03-16 MED ORDER — SODIUM CHLORIDE 0.9 % IV SOLN
3.0000 g | Freq: Four times a day (QID) | INTRAVENOUS | Status: DC
  Administered 2015-03-16 – 2015-03-21 (×22): 3 g via INTRAVENOUS
  Filled 2015-03-16 (×25): qty 3

## 2015-03-16 MED ORDER — SODIUM CHLORIDE 0.9 % IJ SOLN
3.0000 mL | Freq: Two times a day (BID) | INTRAMUSCULAR | Status: DC
Start: 2015-03-16 — End: 2015-03-17
  Administered 2015-03-16: 3 mL via INTRAVENOUS

## 2015-03-16 MED ORDER — IPRATROPIUM BROMIDE 0.02 % IN SOLN
0.5000 mg | Freq: Once | RESPIRATORY_TRACT | Status: DC
  Filled 2015-03-16: qty 2.5

## 2015-03-16 MED ORDER — SODIUM CHLORIDE 0.9 % IV SOLN: INTRAVENOUS | Status: DC

## 2015-03-16 MED ORDER — VARENICLINE TARTRATE 0.5 MG PO TABS
0.5000 mg | ORAL_TABLET | Freq: Two times a day (BID) | ORAL | Status: DC
  Administered 2015-03-16 – 2015-03-18 (×5): 0.5 mg via ORAL
  Filled 2015-03-16 (×8): qty 1

## 2015-03-16 MED ORDER — HYDROCODONE-ACETAMINOPHEN 5-325 MG PO TABS
1.0000 | ORAL_TABLET | Freq: Four times a day (QID) | ORAL | Status: DC | PRN
  Administered 2015-03-16 (×3): 2 via ORAL
  Administered 2015-03-16: 1 via ORAL
  Administered 2015-03-17 – 2015-03-21 (×13): 2 via ORAL
  Filled 2015-03-16 (×17): qty 2

## 2015-03-16 MED ORDER — MAGNESIUM SULFATE 2 GM/50ML IV SOLN
2.0000 g | Freq: Once | INTRAVENOUS | Status: AC
  Administered 2015-03-16: 2 g via INTRAVENOUS
  Filled 2015-03-16: qty 50

## 2015-03-16 MED ORDER — SPIRONOLACTONE 25 MG PO TABS
25.0000 mg | ORAL_TABLET | Freq: Two times a day (BID) | ORAL | Status: DC
  Administered 2015-03-16 – 2015-03-21 (×11): 25 mg via ORAL
  Filled 2015-03-16 (×15): qty 1

## 2015-03-16 MED ORDER — PANTOPRAZOLE SODIUM 40 MG PO TBEC
40.0000 mg | DELAYED_RELEASE_TABLET | Freq: Two times a day (BID) | ORAL | Status: DC
  Administered 2015-03-16 – 2015-03-21 (×10): 40 mg via ORAL
  Filled 2015-03-16 (×10): qty 1

## 2015-03-16 MED ORDER — IOHEXOL 350 MG/ML SOLN
100.0000 mL | Freq: Once | INTRAVENOUS | Status: AC | PRN
  Administered 2015-03-16: 85 mL via INTRAVENOUS

## 2015-03-16 MED ORDER — INSULIN ASPART 100 UNIT/ML ~~LOC~~ SOLN
0.0000 [IU] | Freq: Every day | SUBCUTANEOUS | Status: DC
  Administered 2015-03-16: 5 [IU] via SUBCUTANEOUS
  Administered 2015-03-17: 4 [IU] via SUBCUTANEOUS

## 2015-03-16 MED ORDER — ALBUTEROL (5 MG/ML) CONTINUOUS INHALATION SOLN: 10.0000 mg/h | INHALATION_SOLUTION | RESPIRATORY_TRACT | Status: DC

## 2015-03-16 MED ORDER — MORPHINE SULFATE 4 MG/ML IJ SOLN: 4.0000 mg | INTRAMUSCULAR | Status: DC | PRN

## 2015-03-16 MED ORDER — INSULIN ASPART 100 UNIT/ML ~~LOC~~ SOLN
0.0000 [IU] | Freq: Three times a day (TID) | SUBCUTANEOUS | Status: DC
  Administered 2015-03-16 – 2015-03-17 (×4): 11 [IU] via SUBCUTANEOUS
  Administered 2015-03-17: 15 [IU] via SUBCUTANEOUS
  Administered 2015-03-17 – 2015-03-18 (×2): 11 [IU] via SUBCUTANEOUS
  Administered 2015-03-18: 15 [IU] via SUBCUTANEOUS
  Administered 2015-03-18: 11 [IU] via SUBCUTANEOUS
  Administered 2015-03-19: 5 [IU] via SUBCUTANEOUS
  Administered 2015-03-19 (×2): 11 [IU] via SUBCUTANEOUS
  Administered 2015-03-20: 5 [IU] via SUBCUTANEOUS
  Administered 2015-03-20: 8 [IU] via SUBCUTANEOUS
  Administered 2015-03-20: 5 [IU] via SUBCUTANEOUS
  Administered 2015-03-21: 2 [IU] via SUBCUTANEOUS
  Administered 2015-03-21: 3 [IU] via SUBCUTANEOUS

## 2015-03-16 MED ORDER — ASPIRIN 81 MG PO CHEW
81.0000 mg | CHEWABLE_TABLET | ORAL | Status: AC
  Administered 2015-03-17: 81 mg via ORAL
  Filled 2015-03-16: qty 1

## 2015-03-16 MED ORDER — IPRATROPIUM-ALBUTEROL 0.5-2.5 (3) MG/3ML IN SOLN
3.0000 mL | RESPIRATORY_TRACT | Status: DC
  Administered 2015-03-16 – 2015-03-18 (×14): 3 mL via RESPIRATORY_TRACT
  Filled 2015-03-16 (×14): qty 3

## 2015-03-16 MED ORDER — SODIUM CHLORIDE 0.9 % IJ SOLN: 3.0000 mL | INTRAMUSCULAR | Status: DC | PRN

## 2015-03-16 MED ORDER — INSULIN GLARGINE 100 UNIT/ML ~~LOC~~ SOLN
40.0000 [IU] | Freq: Two times a day (BID) | SUBCUTANEOUS | Status: DC
  Administered 2015-03-16 – 2015-03-19 (×6): 40 [IU] via SUBCUTANEOUS
  Filled 2015-03-16 (×9): qty 0.4

## 2015-03-16 MED ORDER — SODIUM CHLORIDE 0.9 % IV SOLN: 250.0000 mL | INTRAVENOUS | Status: DC | PRN

## 2015-03-16 MED ORDER — HEPARIN SODIUM (PORCINE) 5000 UNIT/ML IJ SOLN
5000.0000 [IU] | Freq: Three times a day (TID) | INTRAMUSCULAR | Status: DC
  Administered 2015-03-16 – 2015-03-21 (×16): 5000 [IU] via SUBCUTANEOUS
  Filled 2015-03-16 (×22): qty 1

## 2015-03-16 MED ORDER — TORSEMIDE 100 MG PO TABS
100.0000 mg | ORAL_TABLET | Freq: Two times a day (BID) | ORAL | Status: DC
Start: 2015-03-16 — End: 2015-03-17
  Administered 2015-03-16 (×2): 100 mg via ORAL
  Filled 2015-03-16 (×6): qty 1

## 2015-03-16 MED ORDER — BUDESONIDE-FORMOTEROL FUMARATE 160-4.5 MCG/ACT IN AERO
2.0000 | INHALATION_SPRAY | Freq: Two times a day (BID) | RESPIRATORY_TRACT | Status: DC
  Administered 2015-03-16 – 2015-03-21 (×9): 2 via RESPIRATORY_TRACT
  Filled 2015-03-16 (×2): qty 6

## 2015-03-16 MED ORDER — ONDANSETRON HCL 4 MG/2ML IJ SOLN: 4.0000 mg | Freq: Three times a day (TID) | INTRAMUSCULAR | Status: DC | PRN

## 2015-03-16 MED ORDER — ALBUTEROL SULFATE (2.5 MG/3ML) 0.083% IN NEBU: 5.0000 mg | INHALATION_SOLUTION | RESPIRATORY_TRACT | Status: DC | PRN

## 2015-03-16 MED ORDER — ALPRAZOLAM 0.5 MG PO TABS
1.0000 mg | ORAL_TABLET | Freq: Three times a day (TID) | ORAL | Status: DC | PRN
  Administered 2015-03-16 – 2015-03-21 (×14): 1 mg via ORAL
  Filled 2015-03-16 (×14): qty 2

## 2015-03-16 MED ORDER — ASPIRIN 81 MG PO CHEW: 81.0000 mg | CHEWABLE_TABLET | Freq: Once | ORAL | Status: DC

## 2015-03-16 MED ORDER — VENLAFAXINE HCL ER 37.5 MG PO CP24
37.5000 mg | ORAL_CAPSULE | Freq: Every day | ORAL | Status: DC
  Administered 2015-03-16 – 2015-03-19 (×4): 37.5 mg via ORAL
  Filled 2015-03-16 (×7): qty 1

## 2015-03-16 MED ORDER — INSULIN GLARGINE 100 UNIT/ML SOLOSTAR PEN: 40.0000 [IU] | PEN_INJECTOR | Freq: Two times a day (BID) | SUBCUTANEOUS | Status: DC

## 2015-03-16 MED ORDER — GUAIFENESIN ER 600 MG PO TB12
600.0000 mg | ORAL_TABLET | Freq: Two times a day (BID) | ORAL | Status: DC
Start: 2015-03-16 — End: 2015-03-18
  Administered 2015-03-16 – 2015-03-18 (×4): 600 mg via ORAL
  Filled 2015-03-16 (×6): qty 1

## 2015-03-16 NOTE — Progress Notes (Signed)
Patient seen and examined, admitted by Dr. Posey Pronto this morning. Briefly 49 year old female with obstructive sleep apnea, on BiPAP, morbid obesity, chronic diastolic CHF, pulmonary hypertension, pulmonary sarcoidosis admitted with progressively worsening shortness of breath in the last 2 days.  BP 111/56 mmHg  Pulse 87  Temp(Src) 98.1 F (36.7 C) (Oral)  Resp 22  Ht 5\' 8"  (1.727 m)  Wt 130.908 kg (288 lb 9.6 oz)  BMI 43.89 kg/m2  SpO2 97%  LMP 10/16/2012  Pulmonary sarcoidosis Patient has a known COPD with OSA, nocturnal BiPAP, chronic diastolic CHF with obesity hypoventilation Recent 2-D echo showed severe pulmonary hypertension with PA pressure of 106 Will continue broncho-dilators, steroids, called pulm consult, d/w Dr Elsworth Soho CT of the chest did not show acute PE however does have possible sarcoidosis progression. Cardiology consult called for chronic CHF, may be worsening due to underlying severe pulmonary hypertension. Planning right heart cath in a.m.   RAI,RIPUDEEP M.D. [facility] 03/16/2015, 1:15 PM  Pager: [phone]

## 2015-03-16 NOTE — Progress Notes (Signed)
Nutrition Brief Note  Patient identified on the Malnutrition Screening Tool (MST) Report.  Wt Readings from Last 15 Encounters:  03/16/15 288 lb 9.6 oz (130.908 kg)  02/16/15 299 lb (135.626 kg)  01/15/15 295 lb (133.811 kg)  01/13/15 292 lb 8 oz (132.677 kg)  01/11/15 298 lb 12.8 oz (135.535 kg)  12/28/14 327 lb 2.6 oz (148.4 kg)  12/18/14 301 lb 13 oz (136.9 kg)  12/06/14 296 lb (134.265 kg)  11/18/14 302 lb 3.2 oz (137.077 kg)  11/03/14 304 lb 1.9 oz (137.948 kg)  10/22/14 289 lb 14.4 oz (131.498 kg)  09/28/14 304 lb 8 oz (138.12 kg)  08/10/14 308 lb 12.8 oz (140.071 kg)  07/30/14 291 lb 8 oz (132.224 kg)  07/24/14 293 lb 12.8 oz (133.267 kg)    Body mass index is 43.89 kg/(m^2). Patient meets criteria for Obesity Class III based on current BMI.   Current diet order is Carbohydrate Modified, patient is consuming approximately 100% of meals at this time. Labs and medications reviewed.   No nutrition interventions warranted at this time. If nutrition issues arise, please consult RD.   Arthur Holms, RD, LDN Pager #: [phone] After-Hours Pager #: [phone]

## 2015-03-16 NOTE — Progress Notes (Addendum)
ANTIBIOTIC CONSULT NOTE - INITIAL  Pharmacy Consult for Unasyn Indication: COPD exacerbation  Allergies  Allergen Reactions  . Simvastatin Other (See Comments)    Severe leg pain and burning  . Sulfamethoxazole-Trimethoprim Nausea And Vomiting    Causes Projectile Vomiting  . Zolpidem Tartrate Other (See Comments)    Chest pain  . Azithromycin Other (See Comments)    Resistant to med  . Ceftin [Cefuroxime] Swelling and Other (See Comments)    MOUTH SWELLING  . Doxycycline Other (See Comments)    Resistant to med  . Latex Itching and Rash    Also burning sensations  . Metformin And Related Diarrhea    Severe diarrhea  . Metolazone Other (See Comments)    MYALGIAS  . Metronidazole Nausea And Vomiting  . Ciprofloxacin Nausea Only  . Tramadol Itching and Nausea Only    Patient Measurements: Height: 5\' 8"  (172.7 cm) Weight: 288 lb 9.6 oz (130.908 kg) (b scale) IBW/kg (Calculated) : 63.9  Vital Signs: Temp: 98.1 F (36.7 C) (06/20 1955) Temp Source: Oral (06/20 1955) BP: 111/56 mmHg (06/21 0250) Pulse Rate: 87 (06/21 0250) Intake/Output from previous day: 06/20 0701 - 06/21 0700 In: -  Out: 525 [Urine:525] Intake/Output from this shift: Total I/O In: -  Out: 525 [Urine:525]  Labs:  Recent Labs  03/15/15 2038 03/15/15 2044 03/16/15 0407  WBC 6.6  --  6.9  HGB 13.9  --  13.6  PLT 190  --  231  CREATININE  --  1.12* 1.25*   Estimated Creatinine Clearance: 78.8 mL/min (by C-G formula based on Cr of 1.25). No results for input(s): VANCOTROUGH, VANCOPEAK, VANCORANDOM, GENTTROUGH, GENTPEAK, GENTRANDOM, TOBRATROUGH, TOBRAPEAK, TOBRARND, AMIKACINPEAK, AMIKACINTROU, AMIKACIN in the last 72 hours.   Microbiology: Recent Results (from the past 720 hour(s))  MRSA PCR Screening     Status: None   Collection Time: 03/16/15  2:40 AM  Result Value Ref Range Status   MRSA by PCR NEGATIVE NEGATIVE Final    Comment:        The GeneXpert MRSA Assay (FDA approved for  NASAL specimens only), is one component of a comprehensive MRSA colonization surveillance program. It is not intended to diagnose MRSA infection nor to guide or monitor treatment for MRSA infections.     Medical History: Past Medical History  Diagnosis Date  . Hypertension   . Hyperlipidemia   . Chronic headache   . Fibromyalgia     daily narcotics  . Anxiety     hx chronic BZ use, stopped 07/2010  . Anemia   . Pulmonary sarcoidosis     unimpressive CT chest 2011  . Colonic polyp   . GERD (gastroesophageal reflux disease)   . ALLERGIC RHINITIS   . Asthma   . CHF (congestive heart failure)     Diastolic with fluid overload, May, 2012, LVEF 60%  . Morbid obesity   . Depression   . Panic attacks   . Diabetes mellitus   . COPD (chronic obstructive pulmonary disease)     on home O2, moderate airflow obstruction, suspect d/t emphysema  . Obesity   . Elevated LFTs 09/2011  . Ovarian cyst   . Chronic back pain   . Sleep apnea      CPAP  . Fatty liver   . Esophagitis   . Gastritis   . Internal hemorrhoids   . Adenomatous colon polyp 12/03/09    [facility] in [place], [place]    Medications:  Prescriptions prior to admission  Medication Sig Dispense Refill Last Dose  . albuterol (PROAIR HFA) 108 (90 BASE) MCG/ACT inhaler Inhale 2 puffs into the lungs every 6 (six) hours as needed for wheezing or shortness of breath. 8.5 g 0 03/15/2015 at Unknown time  . albuterol (PROVENTIL) (2.5 MG/3ML) 0.083% nebulizer solution Take 3 mLs (2.5 mg total) by nebulization every 6 (six) hours as needed for wheezing or shortness of breath. 75 mL 2 03/15/2015 at Unknown time  . ALPRAZolam (XANAX) 1 MG tablet Take 1 tablet (1 mg total) by mouth 3 (three) times daily. 90 tablet 3 03/15/2015 at Unknown time  . aspirin EC 81 MG tablet Take 81 mg by mouth daily.   03/15/2015 at Unknown time  . BIOTIN PO Take 1 capsule by mouth daily.   03/15/2015 at Unknown time  . budesonide-formoterol (SYMBICORT)  160-4.5 MCG/ACT inhaler Inhale 2 puffs into the lungs 2 (two) times daily.   03/15/2015 at Unknown time  . cetirizine (ZYRTEC) 1 MG/ML syrup Take 10 mLs (10 mg total) by mouth at bedtime. 300 mL 0 03/14/2015 at Unknown time  . diclofenac sodium (VOLTAREN) 1 % GEL Apply 4 g topically 4 (four) times daily. (Patient taking differently: Apply 4 g topically 4 (four) times daily as needed (arthritis pain). ) 100 g 0 03/14/2015 at Unknown time  . EMBEDA 20-0.8 MG CPCR Take 1 tablet by mouth daily.  0 03/14/2015 at Unknown time  . esomeprazole (NEXIUM) 40 MG capsule Take 40 mg by mouth 2 (two) times daily before a meal.   03/15/2015 at Unknown time  . fluticasone (FLONASE) 50 MCG/ACT nasal spray Place 2 sprays into both nostrils daily. (Patient taking differently: Place 2 sprays into both nostrils daily as needed for allergies or rhinitis. ) 16 g 5 03/14/2015 at Unknown time  . gabapentin (NEURONTIN) 300 MG capsule Take 300 mg by mouth daily as needed (neuropathy pain).    03/15/2015 at Unknown time  . glimepiride (AMARYL) 1 MG tablet Take 1 mg by mouth daily with breakfast.   03/15/2015 at Unknown time  . hydrOXYzine (ATARAX/VISTARIL) 10 MG tablet TAKE 1 TABLET BY MOUTH THREE TIMES DAILY AS NEEDED FOR ITCHING 30 tablet 11 03/13/2015  . insulin aspart (NOVOLOG FLEXPEN) 100 UNIT/ML FlexPen Inject 5 Units into the skin 3 (three) times daily with meals. 21 mL 0 03/14/2015 at Unknown time  . Insulin Glargine (LANTUS SOLOSTAR) 100 UNIT/ML Solostar Pen Inject 40 Units into the skin 2 (two) times daily. 15 mL 0 03/14/2015 at Unknown time  . Multiple Vitamin (MULTIVITAMIN) capsule Take 1 capsule by mouth once a week.    03/13/2015  . omega-3 fish oil (MAXEPA) 1000 MG CAPS capsule Take 1 capsule by mouth daily.   03/15/2015 at Unknown time  . potassium chloride SA (K-DUR,KLOR-CON) 20 MEQ tablet Take 3 tablets (60 mEq total) by mouth 2 (two) times daily. (Patient taking differently: Take 40 mEq by mouth 2 (two) times daily. ) 180  tablet 11 03/15/2015 at Unknown time  . promethazine (PHENERGAN) 25 MG tablet Take 1 tablet (25 mg total) by mouth every 6 (six) hours as needed for nausea or vomiting. for nausea 30 tablet 0 03/13/2015  . SPIRIVA HANDIHALER 18 MCG inhalation capsule INHALE THE CONTENTS OF 1 CAPSULE VIA INHALATION DEVICE EVERY DAY 30 capsule 0 03/15/2015 at Unknown time  . spironolactone (ALDACTONE) 25 MG tablet Take 25 mg by mouth 2 (two) times daily.   03/15/2015 at Unknown time  . tiZANidine (ZANAFLEX) 2 MG tablet Take 2  mg by mouth 2 (two) times daily as needed for muscle spasms.   3 03/15/2015 at Unknown time  . torsemide (DEMADEX) 100 MG tablet Take 1 tablet (100 mg total) by mouth 2 (two) times daily. 60 tablet 1 03/15/2015 at Unknown time  . varenicline (CHANTIX) 0.5 MG tablet Take 1 tablet (0.5 mg total) by mouth 2 (two) times daily. 60 tablet 3 03/15/2015 at Unknown time  . venlafaxine XR (EFFEXOR XR) 37.5 MG 24 hr capsule Take 1 capsule (37.5 mg total) by mouth daily with breakfast. 30 capsule 3 03/15/2015 at Unknown time  . fluconazole (DIFLUCAN) 150 MG tablet Take 1 tablet (150 mg total) by mouth once. (Patient not taking: Reported on 03/15/2015) 1 tablet 0 Completed Course at Unknown time  . HYDROcodone-acetaminophen (NORCO) 5-325 MG per tablet Take 1-2 tablets by mouth every 6 (six) hours as needed for moderate pain. (Patient not taking: Reported on 03/15/2015) 15 tablet 0 Not Taking at Unknown time  . nystatin (MYCOSTATIN) 100000 UNITS/ML SUSP Take 1 mL by mouth 3 (three) times daily. Swish solution then swallow. (Patient not taking: Reported on 03/15/2015) 120 mL 0 Completed Course at Unknown time  . tiZANidine (ZANAFLEX) 4 MG tablet Take 1 tablet (4 mg total) by mouth every 8 (eight) hours as needed for muscle spasms. (Patient not taking: Reported on 03/15/2015) 90 tablet 3 Not Taking at Unknown time   Assessment: 49 y.o female presents with SOB and cough. To begin Unasyn for COPD exacerbation. Pt with multiple abx  allergies but has tolerated amoxicillin and unasyn in the past. Afeb. WBC wnl.   Goal of Therapy:  Resolution of infection  Plan:  Unasyn 3gm IV q6h Will f/u renal function, micro data, and pt's clinical condition  Sherlon Handing, PharmD, BCPS Clinical pharmacist, pager [phone] 03/16/2015,5:52 AM    ===================   Addendum: - pharmacy will sign off.    Graycie Halley D. Mina Marble, PharmD, BCPS Pager:  [phone] 03/16/2015, 8:35 AM

## 2015-03-16 NOTE — Consult Note (Signed)
Name: [name] MRN: [id number] DOB: Dec 17, 1965    ADMISSION DATE:  03/15/2015 CONSULTATION DATE:  03/16/2015  REFERRING MD :  Tana Coast, triad  CHIEF COMPLAINT:  resp distress  HISTORY OF PRESENT ILLNESS:  49 year old obese smoker who is followed by my partner Dr. [name]. She has known COPD, obesity hypoventilation/OSA maintained on nocturnal BiPAP and chronic diastolic heart failure.Recent echo showed severe pulmonary hypertension. Recent imaging showed pulmonary densities-but appeared to be inflammatory or infectious rather than obvious ILD. She had a transbronchial biopsy performed in [place] in the 90s-that made the diagnosis of sarcoidosis,  she quit smoking in 10/2014 using Chantix  SIGNIFICANT EVENTS  5 admits in last 6 months   STUDIES:  Echo 11/2014 normal LV function, grade 1 diastolic dysfunction, RVSP 106-dilated RV and severely reduced RV function PSG 01/2011 units corrected by BiPAP 17/6 with 2 L oxygen blended PSG 01/2008 RDI 17 per hour   CT angiogram 03/16/15-no PE, stable nodular airspace disease at both lung bases, underlying emphysema borderline enlarged lymph nodes-stable compared to HRCT 11/2014, slight  worse compared to 12/2013 and 07/2013    PFTs 05/2010 moderate airway obstruction -FEV1 46% , moderate intraparenchymal restriction , DLCO 23%      PAST MEDICAL HISTORY :   has a past medical history of Hypertension; Hyperlipidemia; Chronic headache; Fibromyalgia; Anxiety; Anemia; Pulmonary sarcoidosis; Colonic polyp; GERD (gastroesophageal reflux disease); ALLERGIC RHINITIS; Asthma; CHF (congestive heart failure); Morbid obesity; Depression; Panic attacks; Diabetes mellitus; COPD (chronic obstructive pulmonary disease); Obesity; Elevated LFTs (09/2011); Ovarian cyst; Chronic back pain; Sleep apnea; Fatty liver; Esophagitis; Gastritis; Internal hemorrhoids; and Adenomatous colon polyp (12/03/09).  has past surgical history that includes Polypectomy (2011); Lumbar  microdiscectomy (07/06/2011); Back surgery; Carpal tunnel release; steroid spinal injections; Hysteroscopy w/D&C (N/A, 03/25/2013); and Dilation and curettage of uterus. Prior to Admission medications   Medication Sig Start Date End Date Taking? Authorizing Provider  albuterol (PROAIR HFA) 108 (90 BASE) MCG/ACT inhaler Inhale 2 puffs into the lungs every 6 (six) hours as needed for wheezing or shortness of breath. 02/23/15  Yes Rowe Clack, MD  albuterol (PROVENTIL) (2.5 MG/3ML) 0.083% nebulizer solution Take 3 mLs (2.5 mg total) by nebulization every 6 (six) hours as needed for wheezing or shortness of breath. 12/08/14  Yes Rowe Clack, MD  ALPRAZolam Duanne Moron) 1 MG tablet Take 1 tablet (1 mg total) by mouth 3 (three) times daily. 01/25/15  Yes Rowe Clack, MD  aspirin EC 81 MG tablet Take 81 mg by mouth daily.   Yes Historical Provider, MD  BIOTIN PO Take 1 capsule by mouth daily.   Yes Historical Provider, MD  budesonide-formoterol (SYMBICORT) 160-4.5 MCG/ACT inhaler Inhale 2 puffs into the lungs 2 (two) times daily.   Yes Historical Provider, MD  cetirizine (ZYRTEC) 1 MG/ML syrup Take 10 mLs (10 mg total) by mouth at bedtime. 01/06/15  Yes Rowe Clack, MD  diclofenac sodium (VOLTAREN) 1 % GEL Apply 4 g topically 4 (four) times daily. Patient taking differently: Apply 4 g topically 4 (four) times daily as needed (arthritis pain).  12/18/14  Yes Debbe Odea, MD  EMBEDA 20-0.8 MG CPCR Take 1 tablet by mouth daily. 03/09/15  Yes Historical Provider, MD  esomeprazole (NEXIUM) 40 MG capsule Take 40 mg by mouth 2 (two) times daily before a meal.   Yes Historical Provider, MD  fluticasone (FLONASE) 50 MCG/ACT nasal spray Place 2 sprays into both nostrils daily. Patient taking differently: Place 2 sprays into both nostrils daily  as needed for allergies or rhinitis.  10/19/14  Yes Rowe Clack, MD  gabapentin (NEURONTIN) 300 MG capsule Take 300 mg by mouth daily as needed (neuropathy  pain).    Yes Historical Provider, MD  glimepiride (AMARYL) 1 MG tablet Take 1 mg by mouth daily with breakfast.   Yes Historical Provider, MD  hydrOXYzine (ATARAX/VISTARIL) 10 MG tablet TAKE 1 TABLET BY MOUTH THREE TIMES DAILY AS NEEDED FOR ITCHING 08/27/14  Yes Biagio Borg, MD  insulin aspart (NOVOLOG FLEXPEN) 100 UNIT/ML FlexPen Inject 5 Units into the skin 3 (three) times daily with meals. 12/28/14  Yes Shanker Kristeen Mans, MD  Insulin Glargine (LANTUS SOLOSTAR) 100 UNIT/ML Solostar Pen Inject 40 Units into the skin 2 (two) times daily. 01/13/15  Yes Rowe Clack, MD  Multiple Vitamin (MULTIVITAMIN) capsule Take 1 capsule by mouth once a week.    Yes Historical Provider, MD  omega-3 fish oil (MAXEPA) 1000 MG CAPS capsule Take 1 capsule by mouth daily.   Yes Historical Provider, MD  potassium chloride SA (K-DUR,KLOR-CON) 20 MEQ tablet Take 3 tablets (60 mEq total) by mouth 2 (two) times daily. Patient taking differently: Take 40 mEq by mouth 2 (two) times daily.  01/12/15  Yes Scott Joylene Draft, PA-C  promethazine (PHENERGAN) 25 MG tablet Take 1 tablet (25 mg total) by mouth every 6 (six) hours as needed for nausea or vomiting. for nausea 01/11/15  Yes Rowe Clack, MD  SPIRIVA HANDIHALER 18 MCG inhalation capsule INHALE THE CONTENTS OF 1 CAPSULE VIA INHALATION DEVICE EVERY DAY 01/14/15  Yes Kathee Delton, MD  spironolactone (ALDACTONE) 25 MG tablet Take 25 mg by mouth 2 (two) times daily.   Yes Historical Provider, MD  tiZANidine (ZANAFLEX) 2 MG tablet Take 2 mg by mouth 2 (two) times daily as needed for muscle spasms.  03/10/15  Yes Historical Provider, MD  torsemide (DEMADEX) 100 MG tablet Take 1 tablet (100 mg total) by mouth 2 (two) times daily. 07/30/14  Yes Biagio Borg, MD  varenicline (CHANTIX) 0.5 MG tablet Take 1 tablet (0.5 mg total) by mouth 2 (two) times daily. 02/08/15  Yes Rowe Clack, MD  venlafaxine XR (EFFEXOR XR) 37.5 MG 24 hr capsule Take 1 capsule (37.5 mg total) by  mouth daily with breakfast. 11/13/14  Yes Rowe Clack, MD  fluconazole (DIFLUCAN) 150 MG tablet Take 1 tablet (150 mg total) by mouth once. Patient not taking: Reported on 03/15/2015 01/25/15   Rowe Clack, MD  HYDROcodone-acetaminophen (NORCO) 5-325 MG per tablet Take 1-2 tablets by mouth every 6 (six) hours as needed for moderate pain. Patient not taking: Reported on 03/15/2015 02/27/15   Tatyana Kirichenko, PA-C  nystatin (MYCOSTATIN) 100000 UNITS/ML SUSP Take 1 mL by mouth 3 (three) times daily. Swish solution then swallow. Patient not taking: Reported on 03/15/2015 01/04/15   Rowe Clack, MD  tiZANidine (ZANAFLEX) 4 MG tablet Take 1 tablet (4 mg total) by mouth every 8 (eight) hours as needed for muscle spasms. Patient not taking: Reported on 03/15/2015 02/09/15   Tresa Garter, MD   Allergies  Allergen Reactions  . Simvastatin Other (See Comments)    Severe leg pain and burning  . Sulfamethoxazole-Trimethoprim Nausea And Vomiting    Causes Projectile Vomiting  . Zolpidem Tartrate Other (See Comments)    Chest pain  . Azithromycin Other (See Comments)    Resistant to med  . Ceftin [Cefuroxime] Swelling and Other (See Comments)    MOUTH  SWELLING  . Doxycycline Other (See Comments)    Resistant to med  . Latex Itching and Rash    Also burning sensations  . Metformin And Related Diarrhea    Severe diarrhea  . Metolazone Other (See Comments)    MYALGIAS  . Metronidazole Nausea And Vomiting  . Ciprofloxacin Nausea Only  . Tramadol Itching and Nausea Only    FAMILY HISTORY:  family history includes Allergies in her brother; Congestive Heart Failure in her brother; Diabetes type II in her brother and sister; Emphysema in her father; Heart attack in her sister; Heart disease in her father, mother, and sister; Heart failure in her sister; Hypertension in her father, mother, sister, and sister; Stomach cancer in her brother and father; Stroke in her sister. SOCIAL  HISTORY:  reports that she quit smoking about 3 months ago. Her smoking use included Cigarettes. She has a 7.25 pack-year smoking history. She has never used smokeless tobacco. She reports that she drinks alcohol. She reports that she uses illicit drugs (Cocaine and Marijuana).  REVIEW OF SYSTEMS:   Constitutional: Negative for fever, chills, weight loss, malaise/fatigue and diaphoresis.  HENT: Negative for hearing loss, ear pain, nosebleeds, congestion, sore throat, neck pain, tinnitus and ear discharge.   Eyes: Negative for blurred vision, double vision, photophobia, pain, discharge and redness.  Respiratory: Negative for cough, hemoptysis, sputum production,  wheezing and stridor.   Cardiovascular: Negative for chest pain, palpitations, orthopnea, claudication, leg swelling and PND.  Gastrointestinal: Negative for heartburn, nausea, vomiting, abdominal pain, diarrhea, constipation, blood in stool and melena.  Genitourinary: Negative for dysuria, urgency, frequency, hematuria and flank pain.  Musculoskeletal: Negative for myalgias, back pain, joint pain and falls.  Skin: Negative for itching and rash.  Neurological: Negative for dizziness, tingling, tremors, sensory change, speech change, focal weakness, seizures, loss of consciousness, weakness and headaches.  Endo/Heme/Allergies: Negative for environmental allergies and polydipsia. Does not bruise/bleed easily.  SUBJECTIVE:   VITAL SIGNS: Temp:  [98.1 F (36.7 C)] 98.1 F (36.7 C) (06/20 1955) Pulse Rate:  [87-104] 87 (06/21 0250) Resp:  [14-27] 22 (06/21 0250) BP: (111-157)/(56-94) 111/56 mmHg (06/21 0250) SpO2:  [94 %-100 %] 97 % (06/21 0404) Weight:  [288 lb 9.6 oz (130.908 kg)-291 lb 1 oz (132.025 kg)] 288 lb 9.6 oz (130.908 kg) (06/21 0250)  PHYSICAL EXAMINATION: Gen. Pleasant, obese, in no distress, normal affect, sitting up in bed ENT - no lesions, no post nasal drip, class 2-3 airway Neck: No JVD, no thyromegaly, no carotid  bruits Lungs: no use of accessory muscles, no dullness to percussion, decreased without rales or rhonchi  Cardiovascular: Rhythm regular, heart sounds  normal, no murmurs or gallops, 1+ peripheral edema Abdomen: soft and non-tender, no hepatosplenomegaly, BS normal. Musculoskeletal: No deformities, no cyanosis or clubbing Neuro:  alert, non focal, no tremors     Recent Labs Lab 03/15/15 2044 03/16/15 0407  NA 136 132*  K 3.3* 4.1  CL 95* 92*  CO2 29 25  BUN 8 11  CREATININE 1.12* 1.25*  GLUCOSE 162* 338*    Recent Labs Lab 03/15/15 2038 03/16/15 0407  HGB 13.9 13.6  HCT 42.4 43.0  WBC 6.6 6.9  PLT 190 231   Ct Angio Chest Pe W/cm &/or Wo Cm  03/16/2015   CLINICAL DATA:  Acute onset of shortness of breath and elevated D-dimer. Initial encounter.  EXAM: CT ANGIOGRAPHY CHEST WITH CONTRAST  TECHNIQUE: Multidetector CT imaging of the chest was performed using the standard protocol during bolus  administration of intravenous contrast. Multiplanar CT image reconstructions and MIPs were obtained to evaluate the vascular anatomy.  CONTRAST:  17mL OMNIPAQUE IOHEXOL 350 MG/ML SOLN  COMPARISON:  Chest radiograph performed 03/15/2015, and CT of the chest performed 12/21/2014  FINDINGS: There is no evidence of significant pulmonary embolus.  Nodular areas of airspace opacification in the lung bases are stable from March, and likely reflect prior progression of the patient's pulmonary sarcoidosis. No superimposed focal airspace consolidation is seen. There is underlying centrilobular and paraseptal emphysema, diffuse interstitial thickening, and bronchiectasis at the lung bases; these findings are stable from prior studies. No pleural effusion or pneumothorax is seen. No suspicious masses are identified.  The mediastinum is unremarkable in appearance. A borderline prominent 1.1 cm right paratracheal node is seen, and a 1.5 cm azygoesophageal recess node is noted. No pericardial effusion is  identified. Small peribronchial nodes remain borderline normal in size. No axillary lymphadenopathy is seen. The visualized portions of the thyroid gland are unremarkable in appearance.  The visualized portions of the liver and spleen are unremarkable. The visualized portions of the pancreas, stomach, adrenal glands and kidneys are within normal limits.  No acute osseous abnormalities are seen.  Review of the MIP images confirms the above findings.  IMPRESSION: 1. No evidence of significant pulmonary embolus. 2. Stable nodular airspace opacification at the lung bases, reflecting prior progression of the patient's pulmonary sarcoidosis. No superimposed focal airspace consolidation seen. 3. Underlying centrilobular and paraseptal emphysema, diffuse interstitial thickening and bronchiectasis at the lung bases, stable from prior studies. 4. Enlarged mediastinal nodes likely reflect the patient's sarcoidosis.   Electronically Signed   By: Garald Balding M.D.   On: 03/16/2015 03:54   Dg Chest Portable 1 View  03/15/2015   CLINICAL DATA:  Shortness of breath, left chest pain ongoing for 2 days.  EXAM: PORTABLE CHEST - 1 VIEW  COMPARISON:  01/14/2015  FINDINGS: There is mild cardiomegaly. Bibasilar airspace opacities have increased since prior study. No effusions. No acute bony abnormality.  IMPRESSION: Increasing bibasilar opacities. There may be underlying chronic scarring. Cannot exclude superimposed edema or infection.   Electronically Signed   By: Rolm Baptise M.D.   On: 03/15/2015 21:06    ASSESSMENT / PLAN:  Sarcoidosis - diagnosed by transbronchial biopsy 90s, bibasal nodular as per his disease is stable - Doubt she needs steroids for this   COPD-recent smoking cessation, high risk for relapse-she clearly has airway obstruction on prior lung function testing - Treat as exacerbation with steroids and bronchial dilators  -Expect hyperglycemia to get worse -Continue Chantix  Obesity hypoventilation  -   keep on nocturnal BiPAP 17/6 with O2 blended in    severe pulmonary hypertension- agree with right heart cath, if PCWP is high-this would reflect diastolic dysfunction, otherwise expect a combination of hypoxia/COPD and obesity hypoventilation   Will follow with you  Kara Mead MD. FCCP. [facility] Pager [phone] If no response call [phone]    03/16/2015, 10:41 AM

## 2015-03-16 NOTE — Consult Note (Signed)
Advanced Heart Failure Team Consult Note  Referring Physician: Tyler Pita Primary Physician: Gwendolyn Grant, MD Primary Cardiologist:  Dr. Ron Parker  Reason for Consultation:  HF  HPI:    [name] is a 49 y.o. with a PMH of OSA on Bipap, Chronic Diastolic CHF, Pulmonary hypertension, Fibromyalgia, Pulmonary sarcoidosis, HTN, dyslipidemia, and COPD. She presented with cough and worsening sputum of a yellow tinge x 2 days.  She also c/o of increased fatigue/weakness and decreased appetite x 2 week.  She denies current CP, but had some near her admission.  She denies dizziness, lightheadness, N/V, abd pain, or other GI symptoms.  She denies peripheral edema.   She states she is taking all of her medication and using her BiPAP every night.  She came from home at baseline and independent in all of her ADLs.  She does complain of orthopnea and PND, but it is difficult to separate this from her COPD and OSA.  She says she can get around her apartment fine without SOB, but is pretty limited walking any distance.  She can only do about 3-4 stairs before having to stop due to SOB.   Review of Systems: [y] = yes, [ ]  = no   General: Weight gain [ ] ; Weight loss [y]; Anorexia [ ] ; Fatigue [y]; Fever [ ] ; Chills [ ] ; Weakness [ ]   Cardiac: Chest pain/pressure [y]; Resting SOB [ ] ; Exertional SOB [y]; Orthopnea [y]; Pedal Edema [ ] ; Palpitations [ ] ; Syncope [ ] ; Presyncope [ ] ; Paroxysmal nocturnal dyspnea[y]  Pulmonary: Cough [y]; Wheezing[ ] ; Hemoptysis[ ] ; Sputum [y]; Snoring [ ]   GI: Vomiting[ ] ; Dysphagia[ ] ; Melena[ ] ; Hematochezia [ ] ; Heartburn[ ] ; Abdominal pain [ ] ; Constipation [ ] ; Diarrhea [ ] ; BRBPR [ ]   GU: Hematuria[ ] ; Dysuria [ ] ; Nocturia[ ]   Vascular: Pain in legs with walking [ ] ; Pain in feet with lying flat [ ] ; Non-healing sores [ ] ; Stroke [ ] ; TIA [ ] ; Slurred speech [ ] ;  Neuro: Headaches[ ] ; Vertigo[ ] ; Seizures[ ] ; Paresthesias[ ] ;Blurred vision [ ] ; Diplopia [ ] ; Vision changes  [ ]   Ortho/Skin: Arthritis [ ] ; Joint pain [ ] ; Muscle pain [ ] ; Joint swelling [ ] ; Back Pain [ ] ; Rash [ ]   Psych: Depression[ ] ; Anxiety[ ]   Heme: Bleeding problems [ ] ; Clotting disorders [ ] ; Anemia [ ]   Endocrine: Diabetes [ ] ; Thyroid dysfunction[ ]   Home Medications Prior to Admission medications   Medication Sig Start Date End Date Taking? Authorizing Provider  albuterol (PROAIR HFA) 108 (90 BASE) MCG/ACT inhaler Inhale 2 puffs into the lungs every 6 (six) hours as needed for wheezing or shortness of breath. 02/23/15  Yes Rowe Clack, MD  albuterol (PROVENTIL) (2.5 MG/3ML) 0.083% nebulizer solution Take 3 mLs (2.5 mg total) by nebulization every 6 (six) hours as needed for wheezing or shortness of breath. 12/08/14  Yes Rowe Clack, MD  ALPRAZolam Duanne Moron) 1 MG tablet Take 1 tablet (1 mg total) by mouth 3 (three) times daily. 01/25/15  Yes Rowe Clack, MD  aspirin EC 81 MG tablet Take 81 mg by mouth daily.   Yes Historical Provider, MD  BIOTIN PO Take 1 capsule by mouth daily.   Yes Historical Provider, MD  budesonide-formoterol (SYMBICORT) 160-4.5 MCG/ACT inhaler Inhale 2 puffs into the lungs 2 (two) times daily.   Yes Historical Provider, MD  cetirizine (ZYRTEC) 1 MG/ML syrup Take 10 mLs (10 mg total) by mouth at bedtime. 01/06/15  Yes Rowe Clack, MD  diclofenac sodium (VOLTAREN) 1 % GEL Apply 4 g topically 4 (four) times daily. Patient taking differently: Apply 4 g topically 4 (four) times daily as needed (arthritis pain).  12/18/14  Yes Debbe Odea, MD  EMBEDA 20-0.8 MG CPCR Take 1 tablet by mouth daily. 03/09/15  Yes Historical Provider, MD  esomeprazole (NEXIUM) 40 MG capsule Take 40 mg by mouth 2 (two) times daily before a meal.   Yes Historical Provider, MD  fluticasone (FLONASE) 50 MCG/ACT nasal spray Place 2 sprays into both nostrils daily. Patient taking differently: Place 2 sprays into both nostrils daily as needed for allergies or rhinitis.  10/19/14   Yes Rowe Clack, MD  gabapentin (NEURONTIN) 300 MG capsule Take 300 mg by mouth daily as needed (neuropathy pain).    Yes Historical Provider, MD  glimepiride (AMARYL) 1 MG tablet Take 1 mg by mouth daily with breakfast.   Yes Historical Provider, MD  hydrOXYzine (ATARAX/VISTARIL) 10 MG tablet TAKE 1 TABLET BY MOUTH THREE TIMES DAILY AS NEEDED FOR ITCHING 08/27/14  Yes Biagio Borg, MD  insulin aspart (NOVOLOG FLEXPEN) 100 UNIT/ML FlexPen Inject 5 Units into the skin 3 (three) times daily with meals. 12/28/14  Yes Shanker Kristeen Mans, MD  Insulin Glargine (LANTUS SOLOSTAR) 100 UNIT/ML Solostar Pen Inject 40 Units into the skin 2 (two) times daily. 01/13/15  Yes Rowe Clack, MD  Multiple Vitamin (MULTIVITAMIN) capsule Take 1 capsule by mouth once a week.    Yes Historical Provider, MD  omega-3 fish oil (MAXEPA) 1000 MG CAPS capsule Take 1 capsule by mouth daily.   Yes Historical Provider, MD  potassium chloride SA (K-DUR,KLOR-CON) 20 MEQ tablet Take 3 tablets (60 mEq total) by mouth 2 (two) times daily. Patient taking differently: Take 40 mEq by mouth 2 (two) times daily.  01/12/15  Yes Scott Joylene Draft, PA-C  promethazine (PHENERGAN) 25 MG tablet Take 1 tablet (25 mg total) by mouth every 6 (six) hours as needed for nausea or vomiting. for nausea 01/11/15  Yes Rowe Clack, MD  SPIRIVA HANDIHALER 18 MCG inhalation capsule INHALE THE CONTENTS OF 1 CAPSULE VIA INHALATION DEVICE EVERY DAY 01/14/15  Yes Kathee Delton, MD  spironolactone (ALDACTONE) 25 MG tablet Take 25 mg by mouth 2 (two) times daily.   Yes Historical Provider, MD  tiZANidine (ZANAFLEX) 2 MG tablet Take 2 mg by mouth 2 (two) times daily as needed for muscle spasms.  03/10/15  Yes Historical Provider, MD  torsemide (DEMADEX) 100 MG tablet Take 1 tablet (100 mg total) by mouth 2 (two) times daily. 07/30/14  Yes Biagio Borg, MD  varenicline (CHANTIX) 0.5 MG tablet Take 1 tablet (0.5 mg total) by mouth 2 (two) times daily. 02/08/15   Yes Rowe Clack, MD  venlafaxine XR (EFFEXOR XR) 37.5 MG 24 hr capsule Take 1 capsule (37.5 mg total) by mouth daily with breakfast. 11/13/14  Yes Rowe Clack, MD  fluconazole (DIFLUCAN) 150 MG tablet Take 1 tablet (150 mg total) by mouth once. Patient not taking: Reported on 03/15/2015 01/25/15   Rowe Clack, MD  HYDROcodone-acetaminophen (NORCO) 5-325 MG per tablet Take 1-2 tablets by mouth every 6 (six) hours as needed for moderate pain. Patient not taking: Reported on 03/15/2015 02/27/15   Tatyana Kirichenko, PA-C  nystatin (MYCOSTATIN) 100000 UNITS/ML SUSP Take 1 mL by mouth 3 (three) times daily. Swish solution then swallow. Patient not taking: Reported on 03/15/2015 01/04/15   Jannifer Rodney  Asa Lente, MD  tiZANidine (ZANAFLEX) 4 MG tablet Take 1 tablet (4 mg total) by mouth every 8 (eight) hours as needed for muscle spasms. Patient not taking: Reported on 03/15/2015 02/09/15   Tresa Garter, MD    Past Medical History: Past Medical History  Diagnosis Date  . Hypertension   . Hyperlipidemia   . Chronic headache   . Fibromyalgia     daily narcotics  . Anxiety     hx chronic BZ use, stopped 07/2010  . Anemia   . Pulmonary sarcoidosis     unimpressive CT chest 2011  . Colonic polyp   . GERD (gastroesophageal reflux disease)   . ALLERGIC RHINITIS   . Asthma   . CHF (congestive heart failure)     Diastolic with fluid overload, May, 2012, LVEF 60%  . Morbid obesity   . Depression   . Panic attacks   . Diabetes mellitus   . COPD (chronic obstructive pulmonary disease)     on home O2, moderate airflow obstruction, suspect d/t emphysema  . Obesity   . Elevated LFTs 09/2011  . Ovarian cyst   . Chronic back pain   . Sleep apnea      CPAP  . Fatty liver   . Esophagitis   . Gastritis   . Internal hemorrhoids   . Adenomatous colon polyp 12/03/09    [facility] in [place], [place]    Past Surgical History: Past Surgical History  Procedure Laterality Date  .  Polypectomy  2011  . Lumbar microdiscectomy  07/06/2011    R L4-5, stern  . Back surgery    . Carpal tunnel release    . Steroid spinal injections    . Hysteroscopy w/d&c N/A 03/25/2013    Procedure: DILATATION AND CURETTAGE /HYSTEROSCOPY;  Surgeon: [name], MD;  Location: [facility];  Service: Gynecology;  Laterality: N/A;  . Dilation and curettage of uterus      Family History: Family History  Problem Relation Age of Onset  . Hypertension Mother   . Heart disease Mother   . Emphysema Father   . Hypertension Father   . Stomach cancer Father   . Heart disease Father   . Allergies Brother   . Stomach cancer Brother   . Congestive Heart Failure Brother   . Diabetes type II Brother   . Heart disease Sister   . Diabetes type II Sister   . Heart attack Sister   . Stroke Sister   . Hypertension Sister   . Heart failure Sister     diastolic  . Hypertension Sister     Social History: History   Social History  . Marital Status: Legally Separated    Spouse Name: N/A  . Number of Children: N/A  . Years of Education: N/A   Social History Main Topics  . Smoking status: Former Smoker -- 0.25 packs/day for 29 years    Types: Cigarettes    Quit date: 11/24/2014  . Smokeless tobacco: Never Used  . Alcohol Use: 0.0 oz/week    0 Standard drinks or equivalent per week     Comment: occasionally  . Drug Use: Yes    Special: Cocaine, Marijuana     Comment: remote cocaine; current marijuana  . Sexual Activity: Not on file   Other Topics Concern  . None   Social History Narrative   Pt is single. No children. On disability    Allergies:  Allergies  Allergen Reactions  . Simvastatin Other (See Comments)  Severe leg pain and burning  . Sulfamethoxazole-Trimethoprim Nausea And Vomiting    Causes Projectile Vomiting  . Zolpidem Tartrate Other (See Comments)    Chest pain  . Azithromycin Other (See Comments)    Resistant to med  . Ceftin [Cefuroxime] Swelling and Other  (See Comments)    MOUTH SWELLING  . Doxycycline Other (See Comments)    Resistant to med  . Latex Itching and Rash    Also burning sensations  . Metformin And Related Diarrhea    Severe diarrhea  . Metolazone Other (See Comments)    MYALGIAS  . Metronidazole Nausea And Vomiting  . Ciprofloxacin Nausea Only  . Tramadol Itching and Nausea Only    Objective:    Vital Signs:   Temp:  [98.1 F (36.7 C)] 98.1 F (36.7 C) (06/20 1955) Pulse Rate:  [87-104] 87 (06/21 0250) Resp:  [14-27] 22 (06/21 0250) BP: (111-157)/(56-94) 111/56 mmHg (06/21 0250) SpO2:  [94 %-100 %] 97 % (06/21 0404) Weight:  [288 lb 9.6 oz (130.908 kg)-291 lb 1 oz (132.025 kg)] 288 lb 9.6 oz (130.908 kg) (06/21 0250) Last BM Date: 03/15/15  Weight change: Filed Weights   03/15/15 1955 03/16/15 0103 03/16/15 0250  Weight: 289 lb (131.09 kg) 291 lb 1 oz (132.025 kg) 288 lb 9.6 oz (130.908 kg)    Intake/Output:   Intake/Output Summary (Last 24 hours) at 03/16/15 0905 Last data filed at 03/16/15 0600  Gross per 24 hour  Intake    240 ml  Output    525 ml  Net   -285 ml     Physical Exam: General:  Well appearing. No resp difficulty HEENT: normal Neck: Thick. JVP 7-8. Carotids 2+ bilat; no bruits. No lymphadenopathy or thryomegaly appreciated. Cor: PMI nondisplaced. Regular rate & rhythm. No rubs, gallops or murmurs. Lungs: Bibasilar crackles. Scattered rhonchi that clear with coughing. Abdomen: Obese. soft, nontender, nondistended. No hepatosplenomegaly. No bruits or masses. Good bowel sounds. Extremities: no cyanosis, clubbing, or rash. 0 to trace edema in LEs. Neuro: alert & orientedx3, cranial nerves grossly intact. moves all 4 extremities w/o difficulty. Affect pleasant  Telemetry: NSR 80-90s  Labs: Basic Metabolic Panel:  Recent Labs Lab 03/15/15 2044 03/16/15 0407  NA 136 132*  K 3.3* 4.1  CL 95* 92*  CO2 29 25  GLUCOSE 162* 338*  BUN 8 11  CREATININE 1.12* 1.25*  CALCIUM 8.7* 8.5*   MG 1.4*  --     Liver Function Tests:  Recent Labs Lab 03/16/15 0407  AST 34  ALT 29  ALKPHOS 73  BILITOT 0.7  PROT 7.9  ALBUMIN 3.6   No results for input(s): LIPASE, AMYLASE in the last 168 hours. No results for input(s): AMMONIA in the last 168 hours.  CBC:  Recent Labs Lab 03/15/15 2038 03/16/15 0407  WBC 6.6 6.9  NEUTROABS  --  6.4  HGB 13.9 13.6  HCT 42.4 43.0  MCV 76.7* 78.6  PLT 190 231    Cardiac Enzymes:  Recent Labs Lab 03/16/15 0407  TROPONINI <0.03    BNP: BNP (last 3 results)  Recent Labs  12/09/14 0330 12/13/14 0820 12/18/14 1846  BNP 31.0 29.7 231.0*    ProBNP (last 3 results)  Recent Labs  04/17/14 0120 07/20/14 1817  PROBNP 44.7 39.7     CBG:  Recent Labs Lab 03/16/15 0303 03/16/15 0634  GLUCAP 326* 346*    Coagulation Studies:  Recent Labs  03/16/15 0407  LABPROT 14.7  INR 1.13  Other results: EKG: NSR 03/15/15 No ST changes.  Imaging: Ct Angio Chest Pe W/cm &/or Wo Cm  03/16/2015   CLINICAL DATA:  Acute onset of shortness of breath and elevated D-dimer. Initial encounter.  EXAM: CT ANGIOGRAPHY CHEST WITH CONTRAST  TECHNIQUE: Multidetector CT imaging of the chest was performed using the standard protocol during bolus administration of intravenous contrast. Multiplanar CT image reconstructions and MIPs were obtained to evaluate the vascular anatomy.  CONTRAST:  104mL OMNIPAQUE IOHEXOL 350 MG/ML SOLN  COMPARISON:  Chest radiograph performed 03/15/2015, and CT of the chest performed 12/21/2014  FINDINGS: There is no evidence of significant pulmonary embolus.  Nodular areas of airspace opacification in the lung bases are stable from March, and likely reflect prior progression of the patient's pulmonary sarcoidosis. No superimposed focal airspace consolidation is seen. There is underlying centrilobular and paraseptal emphysema, diffuse interstitial thickening, and bronchiectasis at the lung bases; these findings are  stable from prior studies. No pleural effusion or pneumothorax is seen. No suspicious masses are identified.  The mediastinum is unremarkable in appearance. A borderline prominent 1.1 cm right paratracheal node is seen, and a 1.5 cm azygoesophageal recess node is noted. No pericardial effusion is identified. Small peribronchial nodes remain borderline normal in size. No axillary lymphadenopathy is seen. The visualized portions of the thyroid gland are unremarkable in appearance.  The visualized portions of the liver and spleen are unremarkable. The visualized portions of the pancreas, stomach, adrenal glands and kidneys are within normal limits.  No acute osseous abnormalities are seen.  Review of the MIP images confirms the above findings.  IMPRESSION: 1. No evidence of significant pulmonary embolus. 2. Stable nodular airspace opacification at the lung bases, reflecting prior progression of the patient's pulmonary sarcoidosis. No superimposed focal airspace consolidation seen. 3. Underlying centrilobular and paraseptal emphysema, diffuse interstitial thickening and bronchiectasis at the lung bases, stable from prior studies. 4. Enlarged mediastinal nodes likely reflect the patient's sarcoidosis.   Electronically Signed   By: Garald Balding M.D.   On: 03/16/2015 03:54   Dg Chest Portable 1 View  03/15/2015   CLINICAL DATA:  Shortness of breath, left chest pain ongoing for 2 days.  EXAM: PORTABLE CHEST - 1 VIEW  COMPARISON:  01/14/2015  FINDINGS: There is mild cardiomegaly. Bibasilar airspace opacities have increased since prior study. No effusions. No acute bony abnormality.  IMPRESSION: Increasing bibasilar opacities. There may be underlying chronic scarring. Cannot exclude superimposed edema or infection.   Electronically Signed   By: Rolm Baptise M.D.   On: 03/15/2015 21:06      Medications:     Current Medications: . ampicillin-sulbactam (UNASYN) IV  3 g Intravenous Q6H  . aspirin EC  81 mg Oral  Daily  . budesonide-formoterol  2 puff Inhalation BID  . guaiFENesin  600 mg Oral BID  . heparin  5,000 Units Subcutaneous 3 times per day  . insulin aspart  0-15 Units Subcutaneous TID WC  . insulin aspart  0-5 Units Subcutaneous QHS  . insulin glargine  40 Units Subcutaneous BID  . ipratropium-albuterol  3 mL Nebulization Q4H  . methylPREDNISolone (SOLU-MEDROL) injection  60 mg Intravenous 4 times per day  . pantoprazole  40 mg Oral BID AC  . spironolactone  25 mg Oral BID  . torsemide  100 mg Oral BID  . varenicline  0.5 mg Oral BID WC  . venlafaxine XR  37.5 mg Oral Q breakfast     Infusions:      Assessment/Plan  1. Chronic diastolic CHF, pulmonary hypertension,. Continuing home medication which she says she is compliant with. - Strict I/Os - Serial troponins in process.  First: <0/03  - Severe pulmonary HTN by Echo. - Likely needs RHC, was in process of having one scheduled - Pt actually down 10 lbs x 1 month.  May be more related to COPD than CHF. - Does not seem to be greatly fluid overloaded, if at all.    2. COPD exacerbation - On 3L 02 at home. - CXR showed new bilateral basilar opacities. - CT shows no PE, findings consistent with pts sarcoid, and stable interstitial thickening and bronchieactasis - On Solu-Medrol 60 q 6 hrs, duo nebs, Mucinex, flutter device. - On Unasyn. First dose 03/16/15  3. Obesity hypoventilation syndrome. - BiPAP prn and at bedtime  4. Hypokalemia and hypomagnesemia. - Supp  5. Diabetes mellitus hemoglobin A1c March 16 9.4. - Cont home insulin per primary team.  6. Facial plethora, hoarseness of voice, facial puffiness. - Had concern for thoracic outlet syndrome/superior vena cava thrombosis. - No mention of consistent findings on CT.  7. Pulmonary Sarcoidosis - followed by [facility]  8. HTN - Slightly elevated, not an any meds currently - Norvasc in the past that she could remember and had low tolerance for  Hypotension  Length of Stay: 0  Shirley Friar 03/16/2015, 9:05 AM  Advanced Heart Failure Team Pager [phone] (M-F; 7a - 4p)  Please contact [facility] for night-coverage after hours (4p -7a ) and weekends on [email or web address]  Patient seen with PA, agree with the above note.   1. 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  She has quit smoking on Chantix.  She has OHS/OSA on oxygen during the day and CPAP at night.  She also has biopsy-proven sarcoidosis. She has had some changes on chest CT indicative of possible sarcoidosis progression, but per pulmonary note this does not seem to be her primary problem at this time.  She has not been on steroids for sarcoidosis.  She is being treated this admission for COPD exacerbation.  V/Q scan in 3/16 concerning for COPD versus chronic PE, but she has had several chest CTAs, including this admission, that are not indicative of acute or chronic PE.   - Plan to continue steroids and nebs for COPD exacerbation, sounds like from Dr Bari Mantis note that long-term steroids for sarcoidosis would not be warranted at this time.  - Continue oxygen and CPAP for OHS.  2. Pulmonary hypertension: Multiple possible reasons for pulmonary hypertension.  PA systolic pressure estimate 106 mmHg by echo this admission.  Cannot rule out primary left heart failure (group 2) pulmonary hypertension.  There is definitely a contribution from COPD and OHS/OSA (group 3).  However, there could be a contribution from sarcoidosis.   - Plan RHC tomorrow.  - If PVR is significantly elevated, would consider trial of ERA (macitentan or ambrisentan) after left heart filling pressure optimized as there is some evidence for ERA use in sarcoidosis-related PAH.  Will discuss with Dr Elsworth Soho.   Loralie Champagne 03/16/2015 11:15 AM

## 2015-03-16 NOTE — Progress Notes (Signed)
RT placed sterile water in water chamber for humidification. Patient stated she would put on CPAP when she was ready. RT will continue to monitor.

## 2015-03-16 NOTE — H&P (Signed)
[facility] History and Physical  Patient: [name]  MRN: [id number]  DOB: 1965-10-19  DOS: the patient was seen and examined on 03/16/2015 PCP: Gwendolyn Grant, MD  Referring physician: Dr. Zenia Resides Chief Complaint: Shortness of breath and cough  HPI: [name] is a 49 y.o. female with Past medical history of obstructive sleep apnea on BiPAP, chronic diastolic CHF, pulmonary hypertension, fibromyalgia, pulmonary sarcoidosis, essential hypertension, dyslipidemia, COPD. The patient is presenting with complaints of cough with expectoration as well as shortness of breath on ongoing since last 2 days. She mentions she was at her baseline 2 days ago but has been having increasing fatigue and weakness since last one week. She also has been having increased sleepiness with lethargy. She mentions since last 2 days she has been having cough and later on earlier on the morning of 03/15/2015 she started having yellowish expectoration as well as some fever. Along with that her shortness of breath was progressively worsening and it was not improving with routine treatment at home and therefore she decided to come to the hospital. She denies any chest pain chest pressure the time of my evaluation but did have some chest pain earlier. She denies any dizziness or lightheadedness she denies any nausea but did help his symptoms earlier as well. No vomiting no abdominal pain no diarrhea no constipation no burning urination no leg swelling. She mentions he is compliant with all her medication and mentions he is using BiPAP at every night.  The patient is coming from home And at her baseline independent for most of her ADL.  Review of Systems: as mentioned in the history of present illness.  A comprehensive review of the other systems is negative.  Past Medical History  Diagnosis Date  . Hypertension   . Hyperlipidemia   . Chronic headache   . Fibromyalgia     daily narcotics  . Anxiety      hx chronic BZ use, stopped 07/2010  . Anemia   . Pulmonary sarcoidosis     unimpressive CT chest 2011  . Colonic polyp   . GERD (gastroesophageal reflux disease)   . ALLERGIC RHINITIS   . Asthma   . CHF (congestive heart failure)     Diastolic with fluid overload, May, 2012, LVEF 60%  . Morbid obesity   . Depression   . Panic attacks   . Diabetes mellitus   . COPD (chronic obstructive pulmonary disease)     on home O2, moderate airflow obstruction, suspect d/t emphysema  . Obesity   . Elevated LFTs 09/2011  . Ovarian cyst   . Chronic back pain   . Sleep apnea      CPAP  . Fatty liver   . Esophagitis   . Gastritis   . Internal hemorrhoids   . Adenomatous colon polyp 12/03/09    [facility] in [place], [place]   Past Surgical History  Procedure Laterality Date  . Polypectomy  2011  . Lumbar microdiscectomy  07/06/2011    R L4-5, stern  . Back surgery    . Carpal tunnel release    . Steroid spinal injections    . Hysteroscopy w/d&c N/A 03/25/2013    Procedure: DILATATION AND CURETTAGE /HYSTEROSCOPY;  Surgeon: [name], MD;  Location: [facility];  Service: Gynecology;  Laterality: N/A;  . Dilation and curettage of uterus     Social History:  reports that she quit smoking about 3 months ago. Her smoking use included Cigarettes. She has a  7.25 pack-year smoking history. She has never used smokeless tobacco. She reports that she drinks alcohol. She reports that she uses illicit drugs (Cocaine and Marijuana).  Allergies  Allergen Reactions  . Simvastatin Other (See Comments)    Severe leg pain and burning  . Sulfamethoxazole-Trimethoprim Nausea And Vomiting    Causes Projectile Vomiting  . Zolpidem Tartrate Other (See Comments)    Chest pain  . Azithromycin Other (See Comments)    Resistant to med  . Ceftin [Cefuroxime] Swelling and Other (See Comments)    MOUTH SWELLING  . Doxycycline Other (See Comments)    Resistant to med  . Latex Itching and Rash    Also  burning sensations  . Metformin And Related Diarrhea    Severe diarrhea  . Metolazone Other (See Comments)    MYALGIAS  . Metronidazole Nausea And Vomiting  . Ciprofloxacin Nausea Only  . Tramadol Itching and Nausea Only    Family History  Problem Relation Age of Onset  . Hypertension Mother   . Heart disease Mother   . Emphysema Father   . Hypertension Father   . Stomach cancer Father   . Heart disease Father   . Allergies Brother   . Stomach cancer Brother   . Congestive Heart Failure Brother   . Diabetes type II Brother   . Heart disease Sister   . Diabetes type II Sister   . Heart attack Sister   . Stroke Sister   . Hypertension Sister   . Heart failure Sister     diastolic  . Hypertension Sister     Prior to Admission medications   Medication Sig Start Date End Date Taking? Authorizing Provider  albuterol (PROAIR HFA) 108 (90 BASE) MCG/ACT inhaler Inhale 2 puffs into the lungs every 6 (six) hours as needed for wheezing or shortness of breath. 02/23/15  Yes Rowe Clack, MD  albuterol (PROVENTIL) (2.5 MG/3ML) 0.083% nebulizer solution Take 3 mLs (2.5 mg total) by nebulization every 6 (six) hours as needed for wheezing or shortness of breath. 12/08/14  Yes Rowe Clack, MD  ALPRAZolam Duanne Moron) 1 MG tablet Take 1 tablet (1 mg total) by mouth 3 (three) times daily. 01/25/15  Yes Rowe Clack, MD  aspirin EC 81 MG tablet Take 81 mg by mouth daily.   Yes Historical Provider, MD  BIOTIN PO Take 1 capsule by mouth daily.   Yes Historical Provider, MD  budesonide-formoterol (SYMBICORT) 160-4.5 MCG/ACT inhaler Inhale 2 puffs into the lungs 2 (two) times daily.   Yes Historical Provider, MD  cetirizine (ZYRTEC) 1 MG/ML syrup Take 10 mLs (10 mg total) by mouth at bedtime. 01/06/15  Yes Rowe Clack, MD  diclofenac sodium (VOLTAREN) 1 % GEL Apply 4 g topically 4 (four) times daily. Patient taking differently: Apply 4 g topically 4 (four) times daily as needed  (arthritis pain).  12/18/14  Yes Debbe Odea, MD  EMBEDA 20-0.8 MG CPCR Take 1 tablet by mouth daily. 03/09/15  Yes Historical Provider, MD  esomeprazole (NEXIUM) 40 MG capsule Take 40 mg by mouth 2 (two) times daily before a meal.   Yes Historical Provider, MD  fluticasone (FLONASE) 50 MCG/ACT nasal spray Place 2 sprays into both nostrils daily. Patient taking differently: Place 2 sprays into both nostrils daily as needed for allergies or rhinitis.  10/19/14  Yes Rowe Clack, MD  gabapentin (NEURONTIN) 300 MG capsule Take 300 mg by mouth daily as needed (neuropathy pain).    Yes Historical  Provider, MD  glimepiride (AMARYL) 1 MG tablet Take 1 mg by mouth daily with breakfast.   Yes Historical Provider, MD  hydrOXYzine (ATARAX/VISTARIL) 10 MG tablet TAKE 1 TABLET BY MOUTH THREE TIMES DAILY AS NEEDED FOR ITCHING 08/27/14  Yes Biagio Borg, MD  insulin aspart (NOVOLOG FLEXPEN) 100 UNIT/ML FlexPen Inject 5 Units into the skin 3 (three) times daily with meals. 12/28/14  Yes Shanker Kristeen Mans, MD  Insulin Glargine (LANTUS SOLOSTAR) 100 UNIT/ML Solostar Pen Inject 40 Units into the skin 2 (two) times daily. 01/13/15  Yes Rowe Clack, MD  Multiple Vitamin (MULTIVITAMIN) capsule Take 1 capsule by mouth once a week.    Yes Historical Provider, MD  omega-3 fish oil (MAXEPA) 1000 MG CAPS capsule Take 1 capsule by mouth daily.   Yes Historical Provider, MD  potassium chloride SA (K-DUR,KLOR-CON) 20 MEQ tablet Take 3 tablets (60 mEq total) by mouth 2 (two) times daily. Patient taking differently: Take 40 mEq by mouth 2 (two) times daily.  01/12/15  Yes Scott Joylene Draft, PA-C  promethazine (PHENERGAN) 25 MG tablet Take 1 tablet (25 mg total) by mouth every 6 (six) hours as needed for nausea or vomiting. for nausea 01/11/15  Yes Rowe Clack, MD  SPIRIVA HANDIHALER 18 MCG inhalation capsule INHALE THE CONTENTS OF 1 CAPSULE VIA INHALATION DEVICE EVERY DAY 01/14/15  Yes Kathee Delton, MD  spironolactone  (ALDACTONE) 25 MG tablet Take 25 mg by mouth 2 (two) times daily.   Yes Historical Provider, MD  tiZANidine (ZANAFLEX) 2 MG tablet Take 2 mg by mouth 2 (two) times daily as needed for muscle spasms.  03/10/15  Yes Historical Provider, MD  torsemide (DEMADEX) 100 MG tablet Take 1 tablet (100 mg total) by mouth 2 (two) times daily. 07/30/14  Yes Biagio Borg, MD  varenicline (CHANTIX) 0.5 MG tablet Take 1 tablet (0.5 mg total) by mouth 2 (two) times daily. 02/08/15  Yes Rowe Clack, MD  venlafaxine XR (EFFEXOR XR) 37.5 MG 24 hr capsule Take 1 capsule (37.5 mg total) by mouth daily with breakfast. 11/13/14  Yes Rowe Clack, MD  fluconazole (DIFLUCAN) 150 MG tablet Take 1 tablet (150 mg total) by mouth once. Patient not taking: Reported on 03/15/2015 01/25/15   Rowe Clack, MD  HYDROcodone-acetaminophen (NORCO) 5-325 MG per tablet Take 1-2 tablets by mouth every 6 (six) hours as needed for moderate pain. Patient not taking: Reported on 03/15/2015 02/27/15   Tatyana Kirichenko, PA-C  nystatin (MYCOSTATIN) 100000 UNITS/ML SUSP Take 1 mL by mouth 3 (three) times daily. Swish solution then swallow. Patient not taking: Reported on 03/15/2015 01/04/15   Rowe Clack, MD  tiZANidine (ZANAFLEX) 4 MG tablet Take 1 tablet (4 mg total) by mouth every 8 (eight) hours as needed for muscle spasms. Patient not taking: Reported on 03/15/2015 02/09/15   Tresa Garter, MD    Physical Exam: Filed Vitals:   03/16/15 0103 03/16/15 0115 03/16/15 0250 03/16/15 0404  BP: 134/70 127/68 111/56   Pulse: 92 91 87   Temp:      TempSrc:      Resp: 27 26 22    Height: 5\' 8"  (1.727 m)  5\' 8"  (1.727 m)   Weight: 132.025 kg (291 lb 1 oz)  130.908 kg (288 lb 9.6 oz)   SpO2: 94% 95% 97% 97%    General: Alert, Awake and Oriented to Time, Place and Person. Appear in mild distress Facial puffiness, facial plethora as well as  hoarseness of voice  Eyes: PERRL ENT: Oral Mucosa clear moist. Neck: Difficult to  assess JVD Cardiovascular: S1 and S2 Present, aortic systolic Murmur, Peripheral Pulses Present Respiratory: Bilateral Air entry equal and Decreased,  Faint basal Crackles, occasional wheezes Abdomen: Bowel Sound present, Soft and non tender Skin: Chronic skin discoloration on the lower extremity secondary to sarcoidosis Extremities: Trace Pedal edema, no calf tenderness Neurologic: Grossly no focal neuro deficit.  Labs on Admission:  CBC:  Recent Labs Lab 03/15/15 2038 03/16/15 0407  WBC 6.6 6.9  NEUTROABS  --  6.4  HGB 13.9 13.6  HCT 42.4 43.0  MCV 76.7* 78.6  PLT 190 231    CMP     Component Value Date/Time   NA 132* 03/16/2015 0407   K 4.1 03/16/2015 0407   CL 92* 03/16/2015 0407   CO2 25 03/16/2015 0407   GLUCOSE 338* 03/16/2015 0407   BUN 11 03/16/2015 0407   CREATININE 1.25* 03/16/2015 0407   CALCIUM 8.5* 03/16/2015 0407   PROT 7.9 03/16/2015 0407   ALBUMIN 3.6 03/16/2015 0407   AST 34 03/16/2015 0407   ALT 29 03/16/2015 0407   ALKPHOS 73 03/16/2015 0407   BILITOT 0.7 03/16/2015 0407   GFRNONAA 50* 03/16/2015 0407   GFRAA 58* 03/16/2015 0407    No results for input(s): LIPASE, AMYLASE in the last 168 hours.   Recent Labs Lab 03/16/15 0407  TROPONINI <0.03   BNP (last 3 results)  Recent Labs  12/09/14 0330 12/13/14 0820 12/18/14 1846  BNP 31.0 29.7 231.0*    ProBNP (last 3 results)  Recent Labs  04/17/14 0120 07/20/14 1817  PROBNP 44.7 39.7     Radiological Exams on Admission: Ct Angio Chest Pe W/cm &/or Wo Cm  03/16/2015   CLINICAL DATA:  Acute onset of shortness of breath and elevated D-dimer. Initial encounter.  EXAM: CT ANGIOGRAPHY CHEST WITH CONTRAST  TECHNIQUE: Multidetector CT imaging of the chest was performed using the standard protocol during bolus administration of intravenous contrast. Multiplanar CT image reconstructions and MIPs were obtained to evaluate the vascular anatomy.  CONTRAST:  75mL OMNIPAQUE IOHEXOL 350 MG/ML  SOLN  COMPARISON:  Chest radiograph performed 03/15/2015, and CT of the chest performed 12/21/2014  FINDINGS: There is no evidence of significant pulmonary embolus.  Nodular areas of airspace opacification in the lung bases are stable from March, and likely reflect prior progression of the patient's pulmonary sarcoidosis. No superimposed focal airspace consolidation is seen. There is underlying centrilobular and paraseptal emphysema, diffuse interstitial thickening, and bronchiectasis at the lung bases; these findings are stable from prior studies. No pleural effusion or pneumothorax is seen. No suspicious masses are identified.  The mediastinum is unremarkable in appearance. A borderline prominent 1.1 cm right paratracheal node is seen, and a 1.5 cm azygoesophageal recess node is noted. No pericardial effusion is identified. Small peribronchial nodes remain borderline normal in size. No axillary lymphadenopathy is seen. The visualized portions of the thyroid gland are unremarkable in appearance.  The visualized portions of the liver and spleen are unremarkable. The visualized portions of the pancreas, stomach, adrenal glands and kidneys are within normal limits.  No acute osseous abnormalities are seen.  Review of the MIP images confirms the above findings.  IMPRESSION: 1. No evidence of significant pulmonary embolus. 2. Stable nodular airspace opacification at the lung bases, reflecting prior progression of the patient's pulmonary sarcoidosis. No superimposed focal airspace consolidation seen. 3. Underlying centrilobular and paraseptal emphysema, diffuse interstitial thickening and bronchiectasis at  the lung bases, stable from prior studies. 4. Enlarged mediastinal nodes likely reflect the patient's sarcoidosis.   Electronically Signed   By: Garald Balding M.D.   On: 03/16/2015 03:54   Dg Chest Portable 1 View  03/15/2015   CLINICAL DATA:  Shortness of breath, left chest pain ongoing for 2 days.  EXAM: PORTABLE  CHEST - 1 VIEW  COMPARISON:  01/14/2015  FINDINGS: There is mild cardiomegaly. Bibasilar airspace opacities have increased since prior study. No effusions. No acute bony abnormality.  IMPRESSION: Increasing bibasilar opacities. There may be underlying chronic scarring. Cannot exclude superimposed edema or infection.   Electronically Signed   By: Rolm Baptise M.D.   On: 03/15/2015 21:06   EKG: Independently reviewed. Normal sinus rhythm nonspecific ST-T wave changes  Assessment/Plan Principal Problem:   COPD exacerbation Active Problems:   Essential hypertension   Chronic respiratory failure   Fibromyalgia   Obesity hypoventilation syndrome   Diabetes type 2, uncontrolled   Chronic diastolic CHF (congestive heart failure)   COPD (chronic obstructive pulmonary disease) with emphysema   Pulmonary HTN   Hypokalemia   Hypomagnesemia   1. COPD exacerbation The patient is presenting with complains of significantly worsening shortness of breath at her baseline. She appears to be in significant distress and use if necessary muscles. She is still on her 3 L of oxygen at home with that. Chest x-ray is showing bilateral basilar opacities which are not present on the earlier chest x-ray. He has ordered a CT scan of the chest which I will follow. Most likely her presentation appears to be secondary to COPD flareup. We will give her Solu-Medrol 60 every 6, duo nebs, Mucinex, flutter device. We'll use Unasyn based on her allergy.  2. Chronic diastolic CHF, pulmonary hypertension,. Continuing home medication which she says she is compliant with. Monitor ins and outs and daily weight. Follow serial troponin.  3. Obesity hypoventilation syndrome. Patient mentions she is using BiPAP daily at bedtime and a regular basis. We will continue the same.  4. Hypokalemia and hypomagnesemia. Currently replacing.  5. Diabetes mellitus hemoglobin A1c March 16 9.4. Patient mentions he is compliant with her  insulin. We'll continue her home insulin.  6. Facial plethora, hoarseness of voice, facial puffiness. CT of the chest with help ruling out the thoracic outlet syndrome/superior vena cava thrombosis.  Advance goals of care discussion: Full code   DVT Prophylaxis: subcutaneous Heparin Nutrition: Cardiac and diabetic diet  Disposition: Admitted as inpatient, telemetry unit.  Author: Berle Mull, MD [facility] Pager: [phone] 03/16/2015  If 7PM-7AM, please contact night-coverage [email or web address] Password [email or web address]

## 2015-03-17 ENCOUNTER — Telehealth: Payer: Self-pay | Admitting: Internal Medicine

## 2015-03-17 ENCOUNTER — Encounter (HOSPITAL_COMMUNITY)
Admission: EM | Disposition: A | Discharge status: Home Health | Payer: Medicare Other | Source: Home / Self Care | Attending: Internal Medicine

## 2015-03-17 ENCOUNTER — Encounter (HOSPITAL_COMMUNITY): Payer: Self-pay | Admitting: Cardiology

## 2015-03-17 DIAGNOSIS — E1165 Type 2 diabetes mellitus with hyperglycemia: Secondary | ICD-10-CM

## 2015-03-17 DIAGNOSIS — M797 Fibromyalgia: Secondary | ICD-10-CM

## 2015-03-17 DIAGNOSIS — I10 Essential (primary) hypertension: Secondary | ICD-10-CM

## 2015-03-17 DIAGNOSIS — E876 Hypokalemia: Secondary | ICD-10-CM

## 2015-03-17 HISTORY — PX: CARDIAC CATHETERIZATION: SHX172

## 2015-03-17 LAB — GLUCOSE, CAPILLARY
GLUCOSE-CAPILLARY: 304 mg/dL — AB (ref 65–99)
GLUCOSE-CAPILLARY: 353 mg/dL — AB (ref 65–99)
Glucose-Capillary: 333 mg/dL — ABNORMAL HIGH (ref 65–99)
Glucose-Capillary: 313 mg/dL — ABNORMAL HIGH (ref 65–99)
Glucose-Capillary: 320 mg/dL — ABNORMAL HIGH (ref 65–99)

## 2015-03-17 LAB — POCT I-STAT 3, VENOUS BLOOD GAS (G3P V)
ACID-BASE EXCESS: 3 mmol/L — AB (ref 0.0–2.0)
Acid-Base Excess: 3 mmol/L — ABNORMAL HIGH (ref 0.0–2.0)
BICARBONATE: 30 meq/L — AB (ref 20.0–24.0)
BICARBONATE: 30.4 meq/L — AB (ref 20.0–24.0)
O2 Saturation: 69 %
O2 Saturation: 70 %
PCO2 VEN: 54 mmHg — AB (ref 45.0–50.0)
PH VEN: 7.353 — AB (ref 7.250–7.300)
PO2 VEN: 39 mmHg (ref 30.0–45.0)
TCO2: 32 mmol/L (ref 0–100)
TCO2: 32 mmol/L (ref 0–100)
pCO2, Ven: 54.6 mmHg — ABNORMAL HIGH (ref 45.0–50.0)
pH, Ven: 7.354 — ABNORMAL HIGH (ref 7.250–7.300)
pO2, Ven: 39 mmHg (ref 30.0–45.0)

## 2015-03-17 LAB — CBC
HCT: 41.5 % (ref 36.0–46.0)
Hemoglobin: 13.2 g/dL (ref 12.0–15.0)
MCH: 24.7 pg — ABNORMAL LOW (ref 26.0–34.0)
MCHC: 31.8 g/dL (ref 30.0–36.0)
MCV: 77.7 fL — ABNORMAL LOW (ref 78.0–100.0)
Platelets: 231 10*3/uL (ref 150–400)
RBC: 5.34 MIL/uL — AB (ref 3.87–5.11)
RDW: 15.9 % — ABNORMAL HIGH (ref 11.5–15.5)
WBC: 10.8 10*3/uL — ABNORMAL HIGH (ref 4.0–10.5)

## 2015-03-17 LAB — LEGIONELLA ANTIGEN, URINE

## 2015-03-17 LAB — PROTIME-INR
INR: 1.12 (ref 0.00–1.49)
Prothrombin Time: 14.6 seconds (ref 11.6–15.2)

## 2015-03-17 LAB — BASIC METABOLIC PANEL
Anion gap: 14 (ref 5–15)
BUN: 16 mg/dL (ref 6–20)
CHLORIDE: 92 mmol/L — AB (ref 101–111)
CO2: 27 mmol/L (ref 22–32)
Calcium: 8.3 mg/dL — ABNORMAL LOW (ref 8.9–10.3)
Creatinine, Ser: 1.11 mg/dL — ABNORMAL HIGH (ref 0.44–1.00)
GFR calc Af Amer: >60 mL/min (ref >60)
GFR calc non Af Amer: 58 mL/min — ABNORMAL LOW (ref >60)
Glucose, Bld: 350 mg/dL — ABNORMAL HIGH (ref 65–99)
Potassium: 4.2 mmol/L (ref 3.5–5.1)
Sodium: 133 mmol/L — ABNORMAL LOW (ref 135–145)

## 2015-03-17 SURGERY — RIGHT HEART CATH

## 2015-03-17 MED ORDER — ONDANSETRON HCL 4 MG/2ML IJ SOLN: 4.0000 mg | Freq: Four times a day (QID) | INTRAMUSCULAR | Status: DC | PRN

## 2015-03-17 MED ORDER — MIDAZOLAM HCL 2 MG/2ML IJ SOLN
INTRAMUSCULAR | Status: AC
  Filled 2015-03-17: qty 2

## 2015-03-17 MED ORDER — SODIUM CHLORIDE 0.9 % IJ SOLN: 3.0000 mL | INTRAMUSCULAR | Status: DC | PRN

## 2015-03-17 MED ORDER — LIDOCAINE HCL (PF) 1 % IJ SOLN
INTRAMUSCULAR | Status: AC
  Filled 2015-03-17: qty 30

## 2015-03-17 MED ORDER — HYDROMORPHONE HCL 1 MG/ML IJ SOLN
1.0000 mg | Freq: Once | INTRAMUSCULAR | Status: AC
  Administered 2015-03-17: 1 mg via INTRAVENOUS
  Filled 2015-03-17: qty 1

## 2015-03-17 MED ORDER — SODIUM CHLORIDE 0.9 % IV SOLN: 250.0000 mL | INTRAVENOUS | Status: DC | PRN

## 2015-03-17 MED ORDER — FENTANYL CITRATE (PF) 100 MCG/2ML IJ SOLN
INTRAMUSCULAR | Status: AC
  Filled 2015-03-17: qty 2

## 2015-03-17 MED ORDER — SODIUM CHLORIDE 0.9 % IJ SOLN
3.0000 mL | Freq: Two times a day (BID) | INTRAMUSCULAR | Status: DC
  Administered 2015-03-17 – 2015-03-20 (×8): 3 mL via INTRAVENOUS

## 2015-03-17 MED ORDER — ACETAMINOPHEN 325 MG PO TABS: 650.0000 mg | ORAL_TABLET | ORAL | Status: DC | PRN

## 2015-03-17 MED ORDER — FENTANYL CITRATE (PF) 100 MCG/2ML IJ SOLN
INTRAMUSCULAR | Status: DC | PRN
  Administered 2015-03-17 (×2): 25 ug via INTRAVENOUS

## 2015-03-17 MED ORDER — HEPARIN (PORCINE) IN NACL 2-0.9 UNIT/ML-% IJ SOLN
INTRAMUSCULAR | Status: AC
  Filled 2015-03-17: qty 1000

## 2015-03-17 MED ORDER — MIDAZOLAM HCL 2 MG/2ML IJ SOLN
INTRAMUSCULAR | Status: DC | PRN
  Administered 2015-03-17 (×2): 1 mg via INTRAVENOUS

## 2015-03-17 MED ORDER — INSULIN ASPART 100 UNIT/ML ~~LOC~~ SOLN
5.0000 [IU] | Freq: Three times a day (TID) | SUBCUTANEOUS | Status: DC
  Administered 2015-03-17 – 2015-03-19 (×5): 5 [IU] via SUBCUTANEOUS

## 2015-03-17 MED ORDER — FUROSEMIDE 10 MG/ML IJ SOLN
80.0000 mg | Freq: Two times a day (BID) | INTRAMUSCULAR | Status: DC
  Administered 2015-03-17 – 2015-03-18 (×4): 80 mg via INTRAVENOUS
  Filled 2015-03-17 (×7): qty 8

## 2015-03-17 MED ORDER — SODIUM CHLORIDE 0.9 % IJ SOLN
3.0000 mL | Freq: Two times a day (BID) | INTRAMUSCULAR | Status: DC
  Administered 2015-03-17 – 2015-03-20 (×7): 3 mL via INTRAVENOUS

## 2015-03-17 SURGICAL SUPPLY — 21 items
CATH BALLN WEDGE 5F 110CM (CATHETERS) IMPLANT
CATH INFINITI 5 FR JL3.5 (CATHETERS) IMPLANT
CATH INFINITI 5 FR MPA2 (CATHETERS) IMPLANT
CATH INFINITI 5FR ANG PIGTAIL (CATHETERS) IMPLANT
CATH INFINITI JR4 5F (CATHETERS) ×1 IMPLANT
CATH SWAN GANZ 7F STRAIGHT (CATHETERS) ×3 IMPLANT
DEVICE RAD COMP TR BAND LRG (VASCULAR PRODUCTS) IMPLANT
GLIDESHEATH SLEND SS 6F .021 (SHEATH) IMPLANT
HOVERMATT SINGLE USE (MISCELLANEOUS) ×3 IMPLANT
KIT HEART LEFT (KITS) ×3 IMPLANT
KIT HEART RIGHT NAMIC (KITS) IMPLANT
PACK CARDIAC CATHETERIZATION (CUSTOM PROCEDURE TRAY) ×3 IMPLANT
SHEATH FAST CATH BRACH 5F 5CM (SHEATH) ×1 IMPLANT
SHEATH PINNACLE 5F 10CM (SHEATH) IMPLANT
SHEATH PINNACLE 7F 10CM (SHEATH) ×3 IMPLANT
SYR MEDRAD MARK V 150ML (SYRINGE) IMPLANT
TRANSDUCER W/STOPCOCK (MISCELLANEOUS) ×3 IMPLANT
TUBING CIL FLEX 10 FLL-RA (TUBING) IMPLANT
WIRE EMERALD 3MM-J .035X150CM (WIRE) IMPLANT
WIRE HI TORQ VERSACORE-J 145CM (WIRE) IMPLANT
WIRE SAFE-T 1.5MM-J .035X260CM (WIRE) IMPLANT

## 2015-03-17 NOTE — Progress Notes (Signed)
Heart Failure Navigator Consult Note  Presentation: [name] is a 49 y.o. with a PMH of OSA on Bipap, Chronic Diastolic CHF, Pulmonary hypertension, Fibromyalgia, Pulmonary sarcoidosis, HTN, dyslipidemia, and COPD. She presented with cough and worsening sputum of a yellow tinge x 2 days. She also c/o of increased fatigue/weakness and decreased appetite x 2 week. She denies current CP, but had some near her admission. She denies dizziness, lightheadness, N/V, abd pain, or other GI symptoms. She denies peripheral edema. She states she is taking all of her medication and using her BiPAP every night. She came from home at baseline and independent in all of her ADLs. She does complain of orthopnea and PND, but it is difficult to separate this from her COPD and OSA. She says she can get around her apartment fine without SOB, but is pretty limited walking any distance. She can only do about 3-4 stairs before having to stop due to SOB.   Past Medical History  Diagnosis Date  . Hypertension   . Hyperlipidemia   . Chronic headache   . Fibromyalgia     daily narcotics  . Anxiety     hx chronic BZ use, stopped 07/2010  . Anemia   . Pulmonary sarcoidosis     unimpressive CT chest 2011  . Colonic polyp   . GERD (gastroesophageal reflux disease)   . ALLERGIC RHINITIS   . Asthma   . CHF (congestive heart failure)     Diastolic with fluid overload, May, 2012, LVEF 60%  . Morbid obesity   . Depression   . Panic attacks   . Diabetes mellitus   . COPD (chronic obstructive pulmonary disease)     on home O2, moderate airflow obstruction, suspect d/t emphysema  . Obesity   . Elevated LFTs 09/2011  . Ovarian cyst   . Chronic back pain   . Sleep apnea      CPAP  . Fatty liver   . Esophagitis   . Gastritis   . Internal hemorrhoids   . Adenomatous colon polyp 12/03/09    [facility] in [place], [place]    History   Social History  . Marital Status: Legally Separated    Spouse  Name: N/A  . Number of Children: N/A  . Years of Education: N/A   Social History Main Topics  . Smoking status: Former Smoker -- 0.25 packs/day for 29 years    Types: Cigarettes    Quit date: 11/24/2014  . Smokeless tobacco: Never Used  . Alcohol Use: 0.0 oz/week    0 Standard drinks or equivalent per week     Comment: occasionally  . Drug Use: Yes    Special: Cocaine, Marijuana     Comment: remote cocaine; current marijuana  . Sexual Activity: Not on file   Other Topics Concern  . None   Social History Narrative   Pt is single. No children. On disability    ECHO:Study Conclusions  - Left ventricle: The cavity size was normal. Wall thickness was increased in a pattern of moderate LVH. Systolic function was vigorous. The estimated ejection fraction was in the range of 65% to 70%. Doppler parameters are consistent with abnormal left ventricular relaxation (grade 1 diastolic dysfunction). Doppler parameters are consistent with high ventricular filling pressure. - Ventricular septum: The contour showed diastolic flattening and systolic flattening. - Mitral valve: There was mild regurgitation. - Right ventricle: The cavity size was moderately dilated. Systolic function was severely reduced. - Right atrium: The atrium  was mildly dilated. - Atrial septum: The septum bowed from right to left, consistent with increased right atrial pressure. - Pulmonary arteries: Systolic pressure was severely increased. PA peak pressure: 106 mm Hg (S).  Impressions:  - Normal LV function; grade 1 diastolic dysfunction; RAE/RVE; severely reduced RV function; severely elevated pulmonary pressure.  Transthoracic echocardiography. M-mode, complete 2D, spectral Doppler, and color Doppler. Birthdate: Patient birthdate: 27-May-1966. Age: Patient is 49 yr old. Sex: Gender: female. BMI: 45.2 kg/m^2. Blood pressure:   126/88 Patient status: Inpatient. Study date:  Study date: 12/21/2014. Study time: 03:41 PM. Location: ICU/CCU  BNP    Component Value Date/Time   BNP 231.0* 12/18/2014 1846    ProBNP    Component Value Date/Time   PROBNP 39.7 07/20/2014 1817     Education Assessment and Provision:  Detailed education and instructions provided on heart failure disease management including the following:  Signs and symptoms of Heart Failure When to call the physician Importance of daily weights Low sodium diet Fluid restriction Medication management Anticipated future follow-up appointments  Patient education given on each of the above topics. I spoke briefly with [name] regarding her HF and Pulmonary Hypertension diagnoses.  She remained a bit sleepy after cardiac cath and I will plan to return later in the day to do more education.    Education Materials:  "Living Better With Heart Failure" Booklet, Daily Weight Tracker Tool    High Risk Criteria for Readmission and/or Poor Patient Outcomes:   EF <30%- No 65-70% with grade 1 dias dys, PA pressure 106 mmhg  2 or more admissions in 6 months- Yes 5 in 6 months  Difficult social situation-  No  Demonstrates medication noncompliance- No   Barriers of Care:  Knowledge and compliance  Discharge Planning:   She plans to return to her home--She would benefit from [facility] for ongoing HF education as well compliance reinforcement.  She will follow-up in the [facility] as an outpatient.

## 2015-03-17 NOTE — H&P (View-Only) (Signed)
Advanced Heart Failure Team Consult Note  Referring Physician: Tyler Pita Primary Physician: Gwendolyn Grant, MD Primary Cardiologist:  Dr. Ron Parker  Reason for Consultation:  HF  HPI:    [name] is a 49 y.o. with a PMH of OSA on Bipap, Chronic Diastolic CHF, Pulmonary hypertension, Fibromyalgia, Pulmonary sarcoidosis, HTN, dyslipidemia, and COPD. She presented with cough and worsening sputum of a yellow tinge x 2 days.  She also c/o of increased fatigue/weakness and decreased appetite x 2 week.  She denies current CP, but had some near her admission.  She denies dizziness, lightheadness, N/V, abd pain, or other GI symptoms.  She denies peripheral edema.   She states she is taking all of her medication and using her BiPAP every night.  She came from home at baseline and independent in all of her ADLs.  She does complain of orthopnea and PND, but it is difficult to separate this from her COPD and OSA.  She says she can get around her apartment fine without SOB, but is pretty limited walking any distance.  She can only do about 3-4 stairs before having to stop due to SOB.   Review of Systems: [y] = yes, [ ]  = no   General: Weight gain [ ] ; Weight loss [y]; Anorexia [ ] ; Fatigue [y]; Fever [ ] ; Chills [ ] ; Weakness [ ]   Cardiac: Chest pain/pressure [y]; Resting SOB [ ] ; Exertional SOB [y]; Orthopnea [y]; Pedal Edema [ ] ; Palpitations [ ] ; Syncope [ ] ; Presyncope [ ] ; Paroxysmal nocturnal dyspnea[y]  Pulmonary: Cough [y]; Wheezing[ ] ; Hemoptysis[ ] ; Sputum [y]; Snoring [ ]   GI: Vomiting[ ] ; Dysphagia[ ] ; Melena[ ] ; Hematochezia [ ] ; Heartburn[ ] ; Abdominal pain [ ] ; Constipation [ ] ; Diarrhea [ ] ; BRBPR [ ]   GU: Hematuria[ ] ; Dysuria [ ] ; Nocturia[ ]   Vascular: Pain in legs with walking [ ] ; Pain in feet with lying flat [ ] ; Non-healing sores [ ] ; Stroke [ ] ; TIA [ ] ; Slurred speech [ ] ;  Neuro: Headaches[ ] ; Vertigo[ ] ; Seizures[ ] ; Paresthesias[ ] ;Blurred vision [ ] ; Diplopia [ ] ; Vision changes  [ ]   Ortho/Skin: Arthritis [ ] ; Joint pain [ ] ; Muscle pain [ ] ; Joint swelling [ ] ; Back Pain [ ] ; Rash [ ]   Psych: Depression[ ] ; Anxiety[ ]   Heme: Bleeding problems [ ] ; Clotting disorders [ ] ; Anemia [ ]   Endocrine: Diabetes [ ] ; Thyroid dysfunction[ ]   Home Medications Prior to Admission medications   Medication Sig Start Date End Date Taking? Authorizing Provider  albuterol (PROAIR HFA) 108 (90 BASE) MCG/ACT inhaler Inhale 2 puffs into the lungs every 6 (six) hours as needed for wheezing or shortness of breath. 02/23/15  Yes Rowe Clack, MD  albuterol (PROVENTIL) (2.5 MG/3ML) 0.083% nebulizer solution Take 3 mLs (2.5 mg total) by nebulization every 6 (six) hours as needed for wheezing or shortness of breath. 12/08/14  Yes Rowe Clack, MD  ALPRAZolam Duanne Moron) 1 MG tablet Take 1 tablet (1 mg total) by mouth 3 (three) times daily. 01/25/15  Yes Rowe Clack, MD  aspirin EC 81 MG tablet Take 81 mg by mouth daily.   Yes Historical Provider, MD  BIOTIN PO Take 1 capsule by mouth daily.   Yes Historical Provider, MD  budesonide-formoterol (SYMBICORT) 160-4.5 MCG/ACT inhaler Inhale 2 puffs into the lungs 2 (two) times daily.   Yes Historical Provider, MD  cetirizine (ZYRTEC) 1 MG/ML syrup Take 10 mLs (10 mg total) by mouth at bedtime. 01/06/15  Yes Rowe Clack, MD  diclofenac sodium (VOLTAREN) 1 % GEL Apply 4 g topically 4 (four) times daily. Patient taking differently: Apply 4 g topically 4 (four) times daily as needed (arthritis pain).  12/18/14  Yes Debbe Odea, MD  EMBEDA 20-0.8 MG CPCR Take 1 tablet by mouth daily. 03/09/15  Yes Historical Provider, MD  esomeprazole (NEXIUM) 40 MG capsule Take 40 mg by mouth 2 (two) times daily before a meal.   Yes Historical Provider, MD  fluticasone (FLONASE) 50 MCG/ACT nasal spray Place 2 sprays into both nostrils daily. Patient taking differently: Place 2 sprays into both nostrils daily as needed for allergies or rhinitis.  10/19/14   Yes Rowe Clack, MD  gabapentin (NEURONTIN) 300 MG capsule Take 300 mg by mouth daily as needed (neuropathy pain).    Yes Historical Provider, MD  glimepiride (AMARYL) 1 MG tablet Take 1 mg by mouth daily with breakfast.   Yes Historical Provider, MD  hydrOXYzine (ATARAX/VISTARIL) 10 MG tablet TAKE 1 TABLET BY MOUTH THREE TIMES DAILY AS NEEDED FOR ITCHING 08/27/14  Yes Biagio Borg, MD  insulin aspart (NOVOLOG FLEXPEN) 100 UNIT/ML FlexPen Inject 5 Units into the skin 3 (three) times daily with meals. 12/28/14  Yes Shanker Kristeen Mans, MD  Insulin Glargine (LANTUS SOLOSTAR) 100 UNIT/ML Solostar Pen Inject 40 Units into the skin 2 (two) times daily. 01/13/15  Yes Rowe Clack, MD  Multiple Vitamin (MULTIVITAMIN) capsule Take 1 capsule by mouth once a week.    Yes Historical Provider, MD  omega-3 fish oil (MAXEPA) 1000 MG CAPS capsule Take 1 capsule by mouth daily.   Yes Historical Provider, MD  potassium chloride SA (K-DUR,KLOR-CON) 20 MEQ tablet Take 3 tablets (60 mEq total) by mouth 2 (two) times daily. Patient taking differently: Take 40 mEq by mouth 2 (two) times daily.  01/12/15  Yes Scott Joylene Draft, PA-C  promethazine (PHENERGAN) 25 MG tablet Take 1 tablet (25 mg total) by mouth every 6 (six) hours as needed for nausea or vomiting. for nausea 01/11/15  Yes Rowe Clack, MD  SPIRIVA HANDIHALER 18 MCG inhalation capsule INHALE THE CONTENTS OF 1 CAPSULE VIA INHALATION DEVICE EVERY DAY 01/14/15  Yes Kathee Delton, MD  spironolactone (ALDACTONE) 25 MG tablet Take 25 mg by mouth 2 (two) times daily.   Yes Historical Provider, MD  tiZANidine (ZANAFLEX) 2 MG tablet Take 2 mg by mouth 2 (two) times daily as needed for muscle spasms.  03/10/15  Yes Historical Provider, MD  torsemide (DEMADEX) 100 MG tablet Take 1 tablet (100 mg total) by mouth 2 (two) times daily. 07/30/14  Yes Biagio Borg, MD  varenicline (CHANTIX) 0.5 MG tablet Take 1 tablet (0.5 mg total) by mouth 2 (two) times daily. 02/08/15   Yes Rowe Clack, MD  venlafaxine XR (EFFEXOR XR) 37.5 MG 24 hr capsule Take 1 capsule (37.5 mg total) by mouth daily with breakfast. 11/13/14  Yes Rowe Clack, MD  fluconazole (DIFLUCAN) 150 MG tablet Take 1 tablet (150 mg total) by mouth once. Patient not taking: Reported on 03/15/2015 01/25/15   Rowe Clack, MD  HYDROcodone-acetaminophen (NORCO) 5-325 MG per tablet Take 1-2 tablets by mouth every 6 (six) hours as needed for moderate pain. Patient not taking: Reported on 03/15/2015 02/27/15   Tatyana Kirichenko, PA-C  nystatin (MYCOSTATIN) 100000 UNITS/ML SUSP Take 1 mL by mouth 3 (three) times daily. Swish solution then swallow. Patient not taking: Reported on 03/15/2015 01/04/15   Jannifer Rodney  Asa Lente, MD  tiZANidine (ZANAFLEX) 4 MG tablet Take 1 tablet (4 mg total) by mouth every 8 (eight) hours as needed for muscle spasms. Patient not taking: Reported on 03/15/2015 02/09/15   Tresa Garter, MD    Past Medical History: Past Medical History  Diagnosis Date  . Hypertension   . Hyperlipidemia   . Chronic headache   . Fibromyalgia     daily narcotics  . Anxiety     hx chronic BZ use, stopped 07/2010  . Anemia   . Pulmonary sarcoidosis     unimpressive CT chest 2011  . Colonic polyp   . GERD (gastroesophageal reflux disease)   . ALLERGIC RHINITIS   . Asthma   . CHF (congestive heart failure)     Diastolic with fluid overload, May, 2012, LVEF 60%  . Morbid obesity   . Depression   . Panic attacks   . Diabetes mellitus   . COPD (chronic obstructive pulmonary disease)     on home O2, moderate airflow obstruction, suspect d/t emphysema  . Obesity   . Elevated LFTs 09/2011  . Ovarian cyst   . Chronic back pain   . Sleep apnea      CPAP  . Fatty liver   . Esophagitis   . Gastritis   . Internal hemorrhoids   . Adenomatous colon polyp 12/03/09    [facility] in [place], [place]    Past Surgical History: Past Surgical History  Procedure Laterality Date  .  Polypectomy  2011  . Lumbar microdiscectomy  07/06/2011    R L4-5, stern  . Back surgery    . Carpal tunnel release    . Steroid spinal injections    . Hysteroscopy w/d&c N/A 03/25/2013    Procedure: DILATATION AND CURETTAGE /HYSTEROSCOPY;  Surgeon: [name], MD;  Location: [facility];  Service: Gynecology;  Laterality: N/A;  . Dilation and curettage of uterus      Family History: Family History  Problem Relation Age of Onset  . Hypertension Mother   . Heart disease Mother   . Emphysema Father   . Hypertension Father   . Stomach cancer Father   . Heart disease Father   . Allergies Brother   . Stomach cancer Brother   . Congestive Heart Failure Brother   . Diabetes type II Brother   . Heart disease Sister   . Diabetes type II Sister   . Heart attack Sister   . Stroke Sister   . Hypertension Sister   . Heart failure Sister     diastolic  . Hypertension Sister     Social History: History   Social History  . Marital Status: Legally Separated    Spouse Name: N/A  . Number of Children: N/A  . Years of Education: N/A   Social History Main Topics  . Smoking status: Former Smoker -- 0.25 packs/day for 29 years    Types: Cigarettes    Quit date: 11/24/2014  . Smokeless tobacco: Never Used  . Alcohol Use: 0.0 oz/week    0 Standard drinks or equivalent per week     Comment: occasionally  . Drug Use: Yes    Special: Cocaine, Marijuana     Comment: remote cocaine; current marijuana  . Sexual Activity: Not on file   Other Topics Concern  . None   Social History Narrative   Pt is single. No children. On disability    Allergies:  Allergies  Allergen Reactions  . Simvastatin Other (See Comments)  Severe leg pain and burning  . Sulfamethoxazole-Trimethoprim Nausea And Vomiting    Causes Projectile Vomiting  . Zolpidem Tartrate Other (See Comments)    Chest pain  . Azithromycin Other (See Comments)    Resistant to med  . Ceftin [Cefuroxime] Swelling and Other  (See Comments)    MOUTH SWELLING  . Doxycycline Other (See Comments)    Resistant to med  . Latex Itching and Rash    Also burning sensations  . Metformin And Related Diarrhea    Severe diarrhea  . Metolazone Other (See Comments)    MYALGIAS  . Metronidazole Nausea And Vomiting  . Ciprofloxacin Nausea Only  . Tramadol Itching and Nausea Only    Objective:    Vital Signs:   Temp:  [98.1 F (36.7 C)] 98.1 F (36.7 C) (06/20 1955) Pulse Rate:  [87-104] 87 (06/21 0250) Resp:  [14-27] 22 (06/21 0250) BP: (111-157)/(56-94) 111/56 mmHg (06/21 0250) SpO2:  [94 %-100 %] 97 % (06/21 0404) Weight:  [288 lb 9.6 oz (130.908 kg)-291 lb 1 oz (132.025 kg)] 288 lb 9.6 oz (130.908 kg) (06/21 0250) Last BM Date: 03/15/15  Weight change: Filed Weights   03/15/15 1955 03/16/15 0103 03/16/15 0250  Weight: 289 lb (131.09 kg) 291 lb 1 oz (132.025 kg) 288 lb 9.6 oz (130.908 kg)    Intake/Output:   Intake/Output Summary (Last 24 hours) at 03/16/15 0905 Last data filed at 03/16/15 0600  Gross per 24 hour  Intake    240 ml  Output    525 ml  Net   -285 ml     Physical Exam: General:  Well appearing. No resp difficulty HEENT: normal Neck: Thick. JVP 7-8. Carotids 2+ bilat; no bruits. No lymphadenopathy or thryomegaly appreciated. Cor: PMI nondisplaced. Regular rate & rhythm. No rubs, gallops or murmurs. Lungs: Bibasilar crackles. Scattered rhonchi that clear with coughing. Abdomen: Obese. soft, nontender, nondistended. No hepatosplenomegaly. No bruits or masses. Good bowel sounds. Extremities: no cyanosis, clubbing, or rash. 0 to trace edema in LEs. Neuro: alert & orientedx3, cranial nerves grossly intact. moves all 4 extremities w/o difficulty. Affect pleasant  Telemetry: NSR 80-90s  Labs: Basic Metabolic Panel:  Recent Labs Lab 03/15/15 2044 03/16/15 0407  NA 136 132*  K 3.3* 4.1  CL 95* 92*  CO2 29 25  GLUCOSE 162* 338*  BUN 8 11  CREATININE 1.12* 1.25*  CALCIUM 8.7* 8.5*   MG 1.4*  --     Liver Function Tests:  Recent Labs Lab 03/16/15 0407  AST 34  ALT 29  ALKPHOS 73  BILITOT 0.7  PROT 7.9  ALBUMIN 3.6   No results for input(s): LIPASE, AMYLASE in the last 168 hours. No results for input(s): AMMONIA in the last 168 hours.  CBC:  Recent Labs Lab 03/15/15 2038 03/16/15 0407  WBC 6.6 6.9  NEUTROABS  --  6.4  HGB 13.9 13.6  HCT 42.4 43.0  MCV 76.7* 78.6  PLT 190 231    Cardiac Enzymes:  Recent Labs Lab 03/16/15 0407  TROPONINI <0.03    BNP: BNP (last 3 results)  Recent Labs  12/09/14 0330 12/13/14 0820 12/18/14 1846  BNP 31.0 29.7 231.0*    ProBNP (last 3 results)  Recent Labs  04/17/14 0120 07/20/14 1817  PROBNP 44.7 39.7     CBG:  Recent Labs Lab 03/16/15 0303 03/16/15 0634  GLUCAP 326* 346*    Coagulation Studies:  Recent Labs  03/16/15 0407  LABPROT 14.7  INR 1.13  Other results: EKG: NSR 03/15/15 No ST changes.  Imaging: Ct Angio Chest Pe W/cm &/or Wo Cm  03/16/2015   CLINICAL DATA:  Acute onset of shortness of breath and elevated D-dimer. Initial encounter.  EXAM: CT ANGIOGRAPHY CHEST WITH CONTRAST  TECHNIQUE: Multidetector CT imaging of the chest was performed using the standard protocol during bolus administration of intravenous contrast. Multiplanar CT image reconstructions and MIPs were obtained to evaluate the vascular anatomy.  CONTRAST:  44mL OMNIPAQUE IOHEXOL 350 MG/ML SOLN  COMPARISON:  Chest radiograph performed 03/15/2015, and CT of the chest performed 12/21/2014  FINDINGS: There is no evidence of significant pulmonary embolus.  Nodular areas of airspace opacification in the lung bases are stable from March, and likely reflect prior progression of the patient's pulmonary sarcoidosis. No superimposed focal airspace consolidation is seen. There is underlying centrilobular and paraseptal emphysema, diffuse interstitial thickening, and bronchiectasis at the lung bases; these findings are  stable from prior studies. No pleural effusion or pneumothorax is seen. No suspicious masses are identified.  The mediastinum is unremarkable in appearance. A borderline prominent 1.1 cm right paratracheal node is seen, and a 1.5 cm azygoesophageal recess node is noted. No pericardial effusion is identified. Small peribronchial nodes remain borderline normal in size. No axillary lymphadenopathy is seen. The visualized portions of the thyroid gland are unremarkable in appearance.  The visualized portions of the liver and spleen are unremarkable. The visualized portions of the pancreas, stomach, adrenal glands and kidneys are within normal limits.  No acute osseous abnormalities are seen.  Review of the MIP images confirms the above findings.  IMPRESSION: 1. No evidence of significant pulmonary embolus. 2. Stable nodular airspace opacification at the lung bases, reflecting prior progression of the patient's pulmonary sarcoidosis. No superimposed focal airspace consolidation seen. 3. Underlying centrilobular and paraseptal emphysema, diffuse interstitial thickening and bronchiectasis at the lung bases, stable from prior studies. 4. Enlarged mediastinal nodes likely reflect the patient's sarcoidosis.   Electronically Signed   By: Garald Balding M.D.   On: 03/16/2015 03:54   Dg Chest Portable 1 View  03/15/2015   CLINICAL DATA:  Shortness of breath, left chest pain ongoing for 2 days.  EXAM: PORTABLE CHEST - 1 VIEW  COMPARISON:  01/14/2015  FINDINGS: There is mild cardiomegaly. Bibasilar airspace opacities have increased since prior study. No effusions. No acute bony abnormality.  IMPRESSION: Increasing bibasilar opacities. There may be underlying chronic scarring. Cannot exclude superimposed edema or infection.   Electronically Signed   By: Rolm Baptise M.D.   On: 03/15/2015 21:06      Medications:     Current Medications: . ampicillin-sulbactam (UNASYN) IV  3 g Intravenous Q6H  . aspirin EC  81 mg Oral  Daily  . budesonide-formoterol  2 puff Inhalation BID  . guaiFENesin  600 mg Oral BID  . heparin  5,000 Units Subcutaneous 3 times per day  . insulin aspart  0-15 Units Subcutaneous TID WC  . insulin aspart  0-5 Units Subcutaneous QHS  . insulin glargine  40 Units Subcutaneous BID  . ipratropium-albuterol  3 mL Nebulization Q4H  . methylPREDNISolone (SOLU-MEDROL) injection  60 mg Intravenous 4 times per day  . pantoprazole  40 mg Oral BID AC  . spironolactone  25 mg Oral BID  . torsemide  100 mg Oral BID  . varenicline  0.5 mg Oral BID WC  . venlafaxine XR  37.5 mg Oral Q breakfast     Infusions:      Assessment/Plan  1. Chronic diastolic CHF, pulmonary hypertension,. Continuing home medication which she says she is compliant with. - Strict I/Os - Serial troponins in process.  First: <0/03  - Severe pulmonary HTN by Echo. - Likely needs RHC, was in process of having one scheduled - Pt actually down 10 lbs x 1 month.  May be more related to COPD than CHF. - Does not seem to be greatly fluid overloaded, if at all.    2. COPD exacerbation - On 3L 02 at home. - CXR showed new bilateral basilar opacities. - CT shows no PE, findings consistent with pts sarcoid, and stable interstitial thickening and bronchieactasis - On Solu-Medrol 60 q 6 hrs, duo nebs, Mucinex, flutter device. - On Unasyn. First dose 03/16/15  3. Obesity hypoventilation syndrome. - BiPAP prn and at bedtime  4. Hypokalemia and hypomagnesemia. - Supp  5. Diabetes mellitus hemoglobin A1c March 16 9.4. - Cont home insulin per primary team.  6. Facial plethora, hoarseness of voice, facial puffiness. - Had concern for thoracic outlet syndrome/superior vena cava thrombosis. - No mention of consistent findings on CT.  7. Pulmonary Sarcoidosis - followed by [facility]  8. HTN - Slightly elevated, not an any meds currently - Norvasc in the past that she could remember and had low tolerance for  Hypotension  Length of Stay: 0  Shirley Friar 03/16/2015, 9:05 AM  Advanced Heart Failure Team Pager [phone] (M-F; 7a - 4p)  Please contact [facility] for night-coverage after hours (4p -7a ) and weekends on [email or web address]  Patient seen with PA, agree with the above note.   1. 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  She has quit smoking on Chantix.  She has OHS/OSA on oxygen during the day and CPAP at night.  She also has biopsy-proven sarcoidosis. She has had some changes on chest CT indicative of possible sarcoidosis progression, but per pulmonary note this does not seem to be her primary problem at this time.  She has not been on steroids for sarcoidosis.  She is being treated this admission for COPD exacerbation.  V/Q scan in 3/16 concerning for COPD versus chronic PE, but she has had several chest CTAs, including this admission, that are not indicative of acute or chronic PE.   - Plan to continue steroids and nebs for COPD exacerbation, sounds like from Dr Bari Mantis note that long-term steroids for sarcoidosis would not be warranted at this time.  - Continue oxygen and CPAP for OHS.  2. Pulmonary hypertension: Multiple possible reasons for pulmonary hypertension.  PA systolic pressure estimate 106 mmHg by echo this admission.  Cannot rule out primary left heart failure (group 2) pulmonary hypertension.  There is definitely a contribution from COPD and OHS/OSA (group 3).  However, there could be a contribution from sarcoidosis.   - Plan RHC tomorrow.  - If PVR is significantly elevated, would consider trial of ERA (macitentan or ambrisentan) after left heart filling pressure optimized as there is some evidence for ERA use in sarcoidosis-related PAH.  Will discuss with Dr Elsworth Soho.   Loralie Champagne 03/16/2015 11:15 AM

## 2015-03-17 NOTE — Telephone Encounter (Signed)
I can;t take on any new pts

## 2015-03-17 NOTE — Progress Notes (Signed)
[facility] PROGRESS NOTE   [name] SNK:[id number] DOB: 1966/07/01 DOA: 03/15/2015 PCP: Gwendolyn Grant, MD  HPI/Subjective: Seen after her cardiac cath, feels okay, denies any complaints.  Assessment/Plan: Principal Problem:   COPD exacerbation Active Problems:   Essential hypertension   Chronic respiratory failure   Fibromyalgia   Obesity hypoventilation syndrome   Diabetes type 2, uncontrolled   Chronic diastolic CHF (congestive heart failure)   COPD (chronic obstructive pulmonary disease) with emphysema   Pulmonary HTN   Hypokalemia   Hypomagnesemia    COPD exacerbation The patient is presenting with complains of significantly worsening shortness of breath at her baseline. She appears to be in significant distress and use if necessary muscles. She is still on her 3 L of oxygen at home with that. Chest x-ray is showing bilateral basilar opacities which are not present on the earlier chest x-ray. He has ordered a CT scan of the chest which I will follow. Most likely her presentation appears to be secondary to COPD flareup. We will give her Solu-Medrol 60 every 6, duo nebs, Mucinex, flutter device. On Unasyn, continue current respiratory treatment.  Pulmonary hypertension severe Continuing home medication which she says she is compliant with. Cardiac catheterization done showed moderate elevation of right heart filling pressure, suggesting R > L heart failure. Per cardiology start Opsumit when it is approved.  Obesity hypoventilation syndrome. Patient mentions she is using BiPAP daily at bedtime and a regular basis. We will continue the same.  Hypokalemia and hypomagnesemia. Currently replacing.  Diabetes mellitus hemoglobin A1c March 16 9.4. Patient mentions he is compliant with her insulin. Blood sugar worsened because of steroids.  Added 5 units of NovoLog with meals along with SSI and 40 units of Levemir BID.  Facial plethora, hoarseness of  voice, facial puffiness. CT of the chest with help ruling out the thoracic outlet syndrome/superior vena cava thrombosis.  Code Status: Full Code Family Communication: Plan discussed with the patient. Disposition Plan: Remains inpatient Diet: Diet Carb Modified Fluid consistency:: Thin; Room service appropriate?: Yes  Consultants:  Cardiology   Pulmonology  Procedures:  Cardiac catheterization done on 03/17/2015 by Dr. Aundra Dubin Conclusion    1. Mildly elevated left heart filling pressure and moderately elevated right heart filling pressure suggesting R > L heart failure.  2. Severe pulmonary hypertension with elevated PVR. 3. Preserved cardiac output.   Plan:  1. Will diurese with IV Lasix today, 80 mg IV bid.  2. Will plan to start ERA (Opsumit) when get approval.      Antibiotics:  None   Objective: Filed Vitals:   03/17/15 1330  BP: 136/66  Pulse: 74  Temp: 97.9 F (36.6 C)  Resp: 20    Intake/Output Summary (Last 24 hours) at 03/17/15 1511 Last data filed at 03/17/15 1300  Gross per 24 hour  Intake   1280 ml  Output   4200 ml  Net  -2920 ml   Filed Weights   03/16/15 0103 03/16/15 0250 03/17/15 0508  Weight: 132.025 kg (291 lb 1 oz) 130.908 kg (288 lb 9.6 oz) 130.137 kg (286 lb 14.4 oz)    Exam: General: Alert and awake, oriented x3, not in any acute distress. HEENT: anicteric sclera, pupils reactive to light and accommodation, EOMI CVS: S1-S2 clear, no murmur rubs or gallops Chest: clear to auscultation bilaterally, no wheezing, rales or rhonchi Abdomen: soft nontender, nondistended, normal bowel sounds, no organomegaly Extremities: no cyanosis, clubbing or edema noted bilaterally Neuro: Cranial nerves II-XII intact, no  focal neurological deficits  Data Reviewed: Basic Metabolic Panel:  Recent Labs Lab 03/15/15 2044 03/16/15 0407 03/17/15 0419  NA 136 132* 133*  K 3.3* 4.1 4.2  CL 95* 92* 92*  CO2 29 25 27   GLUCOSE 162* 338* 350*    BUN 8 11 16   CREATININE 1.12* 1.25* 1.11*  CALCIUM 8.7* 8.5* 8.3*  MG 1.4*  --   --    Liver Function Tests:  Recent Labs Lab 03/16/15 0407  AST 34  ALT 29  ALKPHOS 73  BILITOT 0.7  PROT 7.9  ALBUMIN 3.6   No results for input(s): LIPASE, AMYLASE in the last 168 hours. No results for input(s): AMMONIA in the last 168 hours. CBC:  Recent Labs Lab 03/15/15 2038 03/16/15 0407 03/17/15 0419  WBC 6.6 6.9 10.8*  NEUTROABS  --  6.4  --   HGB 13.9 13.6 13.2  HCT 42.4 43.0 41.5  MCV 76.7* 78.6 77.7*  PLT 190 231 231   Cardiac Enzymes:  Recent Labs Lab 03/16/15 0407 03/16/15 1000 03/16/15 1529  TROPONINI <0.03 <0.03 <0.03   BNP (last 3 results)  Recent Labs  12/09/14 0330 12/13/14 0820 12/18/14 1846  BNP 31.0 29.7 231.0*    ProBNP (last 3 results)  Recent Labs  04/17/14 0120 07/20/14 1817  PROBNP 44.7 39.7    CBG:  Recent Labs Lab 03/16/15 1630 03/16/15 2057 03/17/15 0616 03/17/15 0920 03/17/15 1129  GLUCAP 307* 355* 333* 304* 353*    Micro Recent Results (from the past 240 hour(s))  MRSA PCR Screening     Status: None   Collection Time: 03/16/15  2:40 AM  Result Value Ref Range Status   MRSA by PCR NEGATIVE NEGATIVE Final    Comment:        The GeneXpert MRSA Assay (FDA approved for NASAL specimens only), is one component of a comprehensive MRSA colonization surveillance program. It is not intended to diagnose MRSA infection nor to guide or monitor treatment for MRSA infections.      Studies: Ct Angio Chest Pe W/cm &/or Wo Cm  03/16/2015   CLINICAL DATA:  Acute onset of shortness of breath and elevated D-dimer. Initial encounter.  EXAM: CT ANGIOGRAPHY CHEST WITH CONTRAST  TECHNIQUE: Multidetector CT imaging of the chest was performed using the standard protocol during bolus administration of intravenous contrast. Multiplanar CT image reconstructions and MIPs were obtained to evaluate the vascular anatomy.  CONTRAST:  24mL  OMNIPAQUE IOHEXOL 350 MG/ML SOLN  COMPARISON:  Chest radiograph performed 03/15/2015, and CT of the chest performed 12/21/2014  FINDINGS: There is no evidence of significant pulmonary embolus.  Nodular areas of airspace opacification in the lung bases are stable from March, and likely reflect prior progression of the patient's pulmonary sarcoidosis. No superimposed focal airspace consolidation is seen. There is underlying centrilobular and paraseptal emphysema, diffuse interstitial thickening, and bronchiectasis at the lung bases; these findings are stable from prior studies. No pleural effusion or pneumothorax is seen. No suspicious masses are identified.  The mediastinum is unremarkable in appearance. A borderline prominent 1.1 cm right paratracheal node is seen, and a 1.5 cm azygoesophageal recess node is noted. No pericardial effusion is identified. Small peribronchial nodes remain borderline normal in size. No axillary lymphadenopathy is seen. The visualized portions of the thyroid gland are unremarkable in appearance.  The visualized portions of the liver and spleen are unremarkable. The visualized portions of the pancreas, stomach, adrenal glands and kidneys are within normal limits.  No acute osseous abnormalities  are seen.  Review of the MIP images confirms the above findings.  IMPRESSION: 1. No evidence of significant pulmonary embolus. 2. Stable nodular airspace opacification at the lung bases, reflecting prior progression of the patient's pulmonary sarcoidosis. No superimposed focal airspace consolidation seen. 3. Underlying centrilobular and paraseptal emphysema, diffuse interstitial thickening and bronchiectasis at the lung bases, stable from prior studies. 4. Enlarged mediastinal nodes likely reflect the patient's sarcoidosis.   Electronically Signed   By: Garald Balding M.D.   On: 03/16/2015 03:54   Dg Chest Portable 1 View  03/15/2015   CLINICAL DATA:  Shortness of breath, left chest pain ongoing  for 2 days.  EXAM: PORTABLE CHEST - 1 VIEW  COMPARISON:  01/14/2015  FINDINGS: There is mild cardiomegaly. Bibasilar airspace opacities have increased since prior study. No effusions. No acute bony abnormality.  IMPRESSION: Increasing bibasilar opacities. There may be underlying chronic scarring. Cannot exclude superimposed edema or infection.   Electronically Signed   By: Rolm Baptise M.D.   On: 03/15/2015 21:06    Scheduled Meds: . ampicillin-sulbactam (UNASYN) IV  3 g Intravenous Q6H  . budesonide-formoterol  2 puff Inhalation BID  . furosemide  80 mg Intravenous BID  . guaiFENesin  600 mg Oral BID  . heparin  5,000 Units Subcutaneous 3 times per day  . insulin aspart  0-15 Units Subcutaneous TID WC  . insulin aspart  0-5 Units Subcutaneous QHS  . insulin glargine  40 Units Subcutaneous BID  . ipratropium-albuterol  3 mL Nebulization Q4H  . methylPREDNISolone (SOLU-MEDROL) injection  60 mg Intravenous 4 times per day  . pantoprazole  40 mg Oral BID AC  . sodium chloride  3 mL Intravenous Q12H  . sodium chloride  3 mL Intravenous Q12H  . spironolactone  25 mg Oral BID  . varenicline  0.5 mg Oral BID WC  . venlafaxine XR  37.5 mg Oral Q breakfast   Continuous Infusions:      Time spent: 35 minutes    [facility] A  [facility] Pager [phone] If 7PM-7AM, please contact night-coverage at [email or web address], password [facility] 03/17/2015, 3:11 PM  LOS: 1 day

## 2015-03-17 NOTE — Telephone Encounter (Signed)
Pt is requesting if she can transfer from [facility] to you.  Dr in the hospital suggested her to come see you?

## 2015-03-17 NOTE — Progress Notes (Signed)
Site area: right groin a 7 french venous sheath was removed  Site Prior to Removal:  Level 0  Pressure Applied For 15 MINUTES    Minutes Beginning at 0920am  Manual:   Yes.    Patient Status During Pull:  stable  Post Pull Groin Site:  Level 0  Post Pull Instructions Given:  Yes.    Post Pull Pulses Present:  Yes.    Dressing Applied:  Yes.    Comments:  VS remain stable during sheath pull. Pt denies any discomfort at this time.

## 2015-03-17 NOTE — Progress Notes (Signed)
Pt states she will place herself on bipap when ready for bed; encouraged her to call if she needs assistance

## 2015-03-17 NOTE — Interval H&P Note (Signed)
History and Physical Interval Note:  03/17/2015 8:21 AM  [name]  has presented today for surgery, with the diagnosis of HF  The various methods of treatment have been discussed with the patient and family. After consideration of risks, benefits and other options for treatment, the patient has consented to  Procedure(s): Right Heart Cath (N/A) as a surgical intervention .  The patient's history has been reviewed, patient examined, no change in status, stable for surgery.  I have reviewed the patient's chart and labs.  Questions were answered to the patient's satisfaction.     Arriyana Rodell [employer]

## 2015-03-17 NOTE — Progress Notes (Signed)
Inpatient Diabetes Program Recommendations  AACE/ADA: New Consensus Statement on Inpatient Glycemic Control (2013)  Target Ranges:  Prepandial:   less than 140 mg/dL      Peak postprandial:   less than 180 mg/dL (1-2 hours)      Critically ill patients:  140 - 180 mg/dL   Results for MARVELINE, PROFETA (MRN [id number]) as of 03/17/2015 15:55  Ref. Range 03/16/2015 16:30 03/16/2015 20:57 03/17/2015 06:16 03/17/2015 09:20 03/17/2015 11:29  Glucose-Capillary Latest Ref Range: 65-99 mg/dL 307 (H) 355 (H) 333 (H) 304 (H) 353 (H)   Inpatient Diabetes Program Recommendations Insulin - Basal: While on solumedrol (which particularly increases fasting glucose) may want to consider increase iin basal lantus to 45 units bid Insulin - Meal Coverage: Noted addition of meal coverage of 5 units tidwc to start ac supper tonight.  Thank you Rosita Kea, RN, MSN, CDE  Diabetes Inpatient Program Office: [phone] Pager: [phone] 8:00 am to 5:00 pm

## 2015-03-18 ENCOUNTER — Inpatient Hospital Stay (HOSPITAL_COMMUNITY): Payer: Medicare Other

## 2015-03-18 DIAGNOSIS — R7989 Other specified abnormal findings of blood chemistry: Secondary | ICD-10-CM | POA: Diagnosis present

## 2015-03-18 DIAGNOSIS — J438 Other emphysema: Secondary | ICD-10-CM

## 2015-03-18 DIAGNOSIS — R791 Abnormal coagulation profile: Secondary | ICD-10-CM

## 2015-03-18 DIAGNOSIS — Z9981 Dependence on supplemental oxygen: Secondary | ICD-10-CM | POA: Insufficient documentation

## 2015-03-18 DIAGNOSIS — I5033 Acute on chronic diastolic (congestive) heart failure: Secondary | ICD-10-CM

## 2015-03-18 LAB — BASIC METABOLIC PANEL
ANION GAP: 9 (ref 5–15)
BUN: 18 mg/dL (ref 6–20)
CALCIUM: 9.5 mg/dL (ref 8.9–10.3)
CO2: 31 mmol/L (ref 22–32)
CREATININE: 1.13 mg/dL — AB (ref 0.44–1.00)
Chloride: 93 mmol/L — ABNORMAL LOW (ref 101–111)
GFR calc Af Amer: >60 mL/min (ref >60)
GFR calc non Af Amer: 57 mL/min — ABNORMAL LOW (ref >60)
Glucose, Bld: 376 mg/dL — ABNORMAL HIGH (ref 65–99)
Potassium: 4 mmol/L (ref 3.5–5.1)
Sodium: 133 mmol/L — ABNORMAL LOW (ref 135–145)

## 2015-03-18 LAB — POCT I-STAT 3, ART BLOOD GAS (G3+)
Acid-Base Excess: 7 mmol/L — ABNORMAL HIGH (ref 0.0–2.0)
BICARBONATE: 29.9 meq/L — AB (ref 20.0–24.0)
O2 Saturation: 99 %
PH ART: 7.555 — AB (ref 7.350–7.450)
Patient temperature: 98
TCO2: 31 mmol/L (ref 0–100)
pCO2 arterial: 33.7 mmHg — ABNORMAL LOW (ref 35.0–45.0)
pO2, Arterial: 108 mmHg — ABNORMAL HIGH (ref 80.0–100.0)

## 2015-03-18 LAB — GLUCOSE, CAPILLARY
GLUCOSE-CAPILLARY: 332 mg/dL — AB (ref 65–99)
Glucose-Capillary: 352 mg/dL — ABNORMAL HIGH (ref 65–99)
Glucose-Capillary: 350 mg/dL — ABNORMAL HIGH (ref 65–99)
Glucose-Capillary: 358 mg/dL — ABNORMAL HIGH (ref 65–99)

## 2015-03-18 MED ORDER — NICOTINE 14 MG/24HR TD PT24
14.0000 mg | MEDICATED_PATCH | Freq: Every day | TRANSDERMAL | Status: DC
  Administered 2015-03-19 – 2015-03-21 (×3): 14 mg via TRANSDERMAL
  Filled 2015-03-18 (×4): qty 1

## 2015-03-18 MED ORDER — METHYLPREDNISOLONE SODIUM SUCC 125 MG IJ SOLR
125.0000 mg | Freq: Once | INTRAMUSCULAR | Status: AC
  Administered 2015-03-18: 125 mg via INTRAVENOUS
  Filled 2015-03-18: qty 2

## 2015-03-18 MED ORDER — HYDROMORPHONE HCL 1 MG/ML IJ SOLN
0.5000 mg | Freq: Once | INTRAMUSCULAR | Status: AC
  Administered 2015-03-18: 0.5 mg via INTRAVENOUS
  Filled 2015-03-18: qty 1

## 2015-03-18 MED ORDER — FLUTICASONE PROPIONATE 50 MCG/ACT NA SUSP
2.0000 | Freq: Every day | NASAL | Status: DC
  Administered 2015-03-18 – 2015-03-21 (×5): 2 via NASAL
  Filled 2015-03-18 (×2): qty 16

## 2015-03-18 MED ORDER — DIPHENHYDRAMINE HCL 50 MG/ML IJ SOLN
25.0000 mg | Freq: Once | INTRAMUSCULAR | Status: AC
  Administered 2015-03-18: 25 mg via INTRAVENOUS
  Filled 2015-03-18: qty 1

## 2015-03-18 MED ORDER — POTASSIUM CHLORIDE CRYS ER 20 MEQ PO TBCR
40.0000 meq | EXTENDED_RELEASE_TABLET | Freq: Once | ORAL | Status: AC
  Administered 2015-03-18: 40 meq via ORAL
  Filled 2015-03-18: qty 2

## 2015-03-18 MED ORDER — ALBUTEROL SULFATE (2.5 MG/3ML) 0.083% IN NEBU
2.5000 mg | INHALATION_SOLUTION | RESPIRATORY_TRACT | Status: DC | PRN
  Filled 2015-03-18: qty 3

## 2015-03-18 MED ORDER — METOCLOPRAMIDE HCL 5 MG/ML IJ SOLN
10.0000 mg | Freq: Once | INTRAMUSCULAR | Status: AC
  Administered 2015-03-18: 10 mg via INTRAVENOUS
  Filled 2015-03-18: qty 2

## 2015-03-18 MED ORDER — IPRATROPIUM-ALBUTEROL 0.5-2.5 (3) MG/3ML IN SOLN
3.0000 mL | Freq: Three times a day (TID) | RESPIRATORY_TRACT | Status: DC
  Administered 2015-03-19 – 2015-03-21 (×7): 3 mL via RESPIRATORY_TRACT
  Filled 2015-03-18 (×8): qty 3

## 2015-03-18 MED ORDER — METHYLPREDNISOLONE SODIUM SUCC 40 MG IJ SOLR
40.0000 mg | Freq: Two times a day (BID) | INTRAMUSCULAR | Status: DC
  Filled 2015-03-18: qty 1

## 2015-03-18 MED ORDER — GUAIFENESIN ER 600 MG PO TB12
1200.0000 mg | ORAL_TABLET | Freq: Two times a day (BID) | ORAL | Status: DC
  Administered 2015-03-18 – 2015-03-21 (×6): 1200 mg via ORAL
  Filled 2015-03-18 (×7): qty 2

## 2015-03-18 MED FILL — Lidocaine HCl Local Preservative Free (PF) Inj 1%: INTRAMUSCULAR | Qty: 30 | Status: AC

## 2015-03-18 MED FILL — Heparin Sodium (Porcine) 2 Unit/ML in Sodium Chloride 0.9%: INTRAMUSCULAR | Qty: 1000 | Status: AC

## 2015-03-18 NOTE — Progress Notes (Signed)
Pt states she will put BIPAP on when she is ready. She has slept all day and doesn't know when she will sleep. RN at bedside. RT will monitor.

## 2015-03-18 NOTE — Progress Notes (Signed)
Name: [name] MRN: [id number] DOB: [birth year]    ADMISSION DATE:  03/15/2015 CONSULTATION DATE:  03/18/2015  REFERRING MD :  Tana Coast, [facility]  CHIEF COMPLAINT:  resp distress  HISTORY OF PRESENT ILLNESS:  49 year old obese smoker who is followed by my partner Dr. [name]. She has known COPD, obesity hypoventilation/OSA maintained on nocturnal BiPAP and chronic diastolic heart failure.Recent echo showed severe pulmonary hypertension. Recent imaging showed pulmonary densities-but appeared to be inflammatory or infectious rather than obvious ILD. She had a transbronchial biopsy performed in [place] in the 90s-that made the diagnosis of sarcoidosis,  she quit smoking in 10/2014 using Chantix  SIGNIFICANT EVENTS  5 admits in last 6 months   STUDIES:  Echo 11/2014 normal LV function, grade 1 diastolic dysfunction, RVSP 106-dilated RV and severely reduced RV function PSG 01/2011 units corrected by BiPAP 17/6 with 2 L oxygen blended PSG 01/2008 RDI 17 per hour   CT angiogram 03/16/15-no PE, stable nodular airspace disease at both lung bases, underlying emphysema borderline enlarged lymph nodes-stable compared to HRCT 11/2014, slight  worse compared to 12/2013 and 07/2013    PFTs 05/2010 moderate airway obstruction -FEV1 46% , moderate intraparenchymal restriction , DLCO 23%   Cath 03/17/15 >> PVR 5.5 , PCWP mean 20, RA 16, RV 73/22, PA 70/32, CI 2.7 (fick)    SUBJECTIVE: Diuresing well with Lasix Denies chest pain, dyspnea persists but improving She is concerned about long-term prognosis  VITAL SIGNS: Temp:  [97.5 F (36.4 C)-98 F (36.7 C)] 97.5 F (36.4 C) (06/23 0626) Pulse Rate:  [70-86] 77 (06/23 0958) Resp:  [18-20] 18 (06/23 0958) BP: (109-144)/(50-78) 109/53 mmHg (06/23 0958) SpO2:  [91 %-99 %] 94 % (06/23 1016)  PHYSICAL EXAMINATION: Gen. Pleasant, obese, in no distress, normal affect, sitting up in bed ENT - no lesions, no post nasal drip, class 2-3 airway Neck: No JVD,  no thyromegaly, no carotid bruits Lungs: no use of accessory muscles, no dullness to percussion, decreased without rales or rhonchi  Cardiovascular: Rhythm regular, heart sounds  normal, no murmurs or gallops, 1+ peripheral edema Abdomen: soft and non-tender, no hepatosplenomegaly, BS normal. Musculoskeletal: No deformities, no cyanosis or clubbing Neuro:  alert, non focal, no tremors     Recent Labs Lab 03/16/15 0407 03/17/15 0419 03/18/15 0414  NA 132* 133* 133*  K 4.1 4.2 4.0  CL 92* 92* 93*  CO2 25 27 31   BUN 11 16 18   CREATININE 1.25* 1.11* 1.13*  GLUCOSE 338* 350* 376*    Recent Labs Lab 03/15/15 2038 03/16/15 0407 03/17/15 0419  HGB 13.9 13.6 13.2  HCT 42.4 43.0 41.5  WBC 6.6 6.9 10.8*  PLT 190 231 231   No results found.  ASSESSMENT / PLAN:  Sarcoidosis - diagnosed by transbronchial biopsy 90s, bibasal nodular as per his disease is stable - Doubt she needs steroids for this   COPD-recent smoking cessation, high risk for relapse-she clearly has airway obstruction on prior lung function testing - Treat as exacerbation with steroids and bronchial dilators -decrease Solu-Medrol 40 every 12, prednisone 40 starting 6/24-tapered over 2 weeks -Expect hyperglycemia to get worse -Continue Chantix, smoking cessation again emphasized  Obesity hypoventilation  -  keep on nocturnal BiPAP 17/6 with O2 blended in    severe pulmonary hypertension-multifactorial, diurese with Lasix, ultimately plan on ERA (macitentan)    Kara Mead MD. FCCP. [facility] Pager [phone] If no response call [phone]    03/18/2015, 12:49 PM

## 2015-03-18 NOTE — Progress Notes (Signed)
[facility] PROGRESS NOTE   [name] RSW:[id number] DOB: [birth year] DOA: 03/15/2015 PCP: Gwendolyn Grant, MD  HPI/Subjective: Feels much better, good urine output. Continue treatment for COPD exacerbation. Complaining about severe headache, called it "strange headache" we'll obtain MRI of the head.  Assessment/Plan: Principal Problem:   COPD exacerbation Active Problems:   Essential hypertension   Chronic respiratory failure   Fibromyalgia   Obesity hypoventilation syndrome   Diabetes type 2, uncontrolled   Chronic diastolic CHF (congestive heart failure)   COPD (chronic obstructive pulmonary disease) with emphysema   Pulmonary HTN   Hypokalemia   Hypomagnesemia    COPD exacerbation The patient is presenting with complains of significantly worsening shortness of breath at her baseline. She appears to be in significant distress and use if necessary muscles. She is still on her 3 L of oxygen at home with that. Chest x-ray is showing bilateral basilar opacities which are not present on the earlier chest x-ray. He has ordered a CT scan of the chest which I will follow. Most likely her presentation appears to be secondary to COPD flareup. We will give her Solu-Medrol 60 every 6, duo nebs, Mucinex, flutter device. On Unasyn, continue current respiratory treatment.  Pulmonary hypertension severe Continuing home medication which she says she is compliant with. Cardiac catheterization done showed moderate elevation of right heart filling pressure, suggesting R > L heart failure. Per cardiology start Opsumit when it is approved.  Obesity hypoventilation syndrome. Patient mentions she is using BiPAP daily at bedtime and a regular basis. We will continue the same.  Hypokalemia and hypomagnesemia. Currently replacing.  Diabetes mellitus hemoglobin A1c March 16 9.4. Patient mentions he is compliant with her insulin. Blood sugar worsened because of steroids.   Added 5 units of NovoLog with meals along with SSI and 40 units of Levemir BID.  Facial plethora, hoarseness of voice, facial puffiness. CT of the chest with help ruling out the thoracic outlet syndrome/superior vena cava thrombosis.  Severe headache Get MRI of the brain, recent heart cath is right heart cath so does not predispose for strokes.  Code Status: Full Code Family Communication: Plan discussed with the patient. Disposition Plan: Remains inpatient Diet: Diet Carb Modified Fluid consistency:: Thin; Room service appropriate?: Yes  Consultants:  Cardiology   Pulmonology  Procedures:  Cardiac catheterization done on 03/17/2015 by Dr. Aundra Dubin Conclusion    1. Mildly elevated left heart filling pressure and moderately elevated right heart filling pressure suggesting R > L heart failure.  2. Severe pulmonary hypertension with elevated PVR. 3. Preserved cardiac output.   Plan:  1. Will diurese with IV Lasix today, 80 mg IV bid.  2. Will plan to start ERA (Opsumit) when get approval.      Antibiotics:  None   Objective: Filed Vitals:   03/18/15 0958  BP: 109/53  Pulse: 77  Temp:   Resp: 18    Intake/Output Summary (Last 24 hours) at 03/18/15 1431 Last data filed at 03/18/15 1425  Gross per 24 hour  Intake   2360 ml  Output   6850 ml  Net  -4490 ml   Filed Weights   03/16/15 0103 03/16/15 0250 03/17/15 0508  Weight: 132.025 kg (291 lb 1 oz) 130.908 kg (288 lb 9.6 oz) 130.137 kg (286 lb 14.4 oz)    Exam: General: Alert and awake, oriented x3, not in any acute distress. HEENT: anicteric sclera, pupils reactive to light and accommodation, EOMI CVS: S1-S2 clear, no murmur rubs or gallops  Chest: clear to auscultation bilaterally, no wheezing, rales or rhonchi Abdomen: soft nontender, nondistended, normal bowel sounds, no organomegaly Extremities: no cyanosis, clubbing or edema noted bilaterally Neuro: Cranial nerves II-XII intact, no focal  neurological deficits  Data Reviewed: Basic Metabolic Panel:  Recent Labs Lab 03/15/15 2044 03/16/15 0407 03/17/15 0419 03/18/15 0414  NA 136 132* 133* 133*  K 3.3* 4.1 4.2 4.0  CL 95* 92* 92* 93*  CO2 29 25 27 31   GLUCOSE 162* 338* 350* 376*  BUN 8 11 16 18   CREATININE 1.12* 1.25* 1.11* 1.13*  CALCIUM 8.7* 8.5* 8.3* 9.5  MG 1.4*  --   --   --    Liver Function Tests:  Recent Labs Lab 03/16/15 0407  AST 34  ALT 29  ALKPHOS 73  BILITOT 0.7  PROT 7.9  ALBUMIN 3.6   No results for input(s): LIPASE, AMYLASE in the last 168 hours. No results for input(s): AMMONIA in the last 168 hours. CBC:  Recent Labs Lab 03/15/15 2038 03/16/15 0407 03/17/15 0419  WBC 6.6 6.9 10.8*  NEUTROABS  --  6.4  --   HGB 13.9 13.6 13.2  HCT 42.4 43.0 41.5  MCV 76.7* 78.6 77.7*  PLT 190 231 231   Cardiac Enzymes:  Recent Labs Lab 03/16/15 0407 03/16/15 1000 03/16/15 1529  TROPONINI <0.03 <0.03 <0.03   BNP (last 3 results)  Recent Labs  12/09/14 0330 12/13/14 0820 12/18/14 1846  BNP 31.0 29.7 231.0*    ProBNP (last 3 results)  Recent Labs  04/17/14 0120 07/20/14 1817  PROBNP 44.7 39.7    CBG:  Recent Labs Lab 03/17/15 1129 03/17/15 1648 03/17/15 2107 03/18/15 0626 03/18/15 1146  GLUCAP 353* 313* 320* 352* 332*    Micro Recent Results (from the past 240 hour(s))  MRSA PCR Screening     Status: None   Collection Time: 03/16/15  2:40 AM  Result Value Ref Range Status   MRSA by PCR NEGATIVE NEGATIVE Final    Comment:        The GeneXpert MRSA Assay (FDA approved for NASAL specimens only), is one component of a comprehensive MRSA colonization surveillance program. It is not intended to diagnose MRSA infection nor to guide or monitor treatment for MRSA infections.      Studies: No results found.  Scheduled Meds: . ampicillin-sulbactam (UNASYN) IV  3 g Intravenous Q6H  . budesonide-formoterol  2 puff Inhalation BID  . fluticasone  2 spray  Each Nare Daily  . furosemide  80 mg Intravenous BID  . guaiFENesin  1,200 mg Oral BID  . heparin  5,000 Units Subcutaneous 3 times per day  . insulin aspart  0-15 Units Subcutaneous TID WC  . insulin aspart  0-5 Units Subcutaneous QHS  . insulin aspart  5 Units Subcutaneous TID WC  . insulin glargine  40 Units Subcutaneous BID  . ipratropium-albuterol  3 mL Nebulization Q4H  . [START ON 03/19/2015] methylPREDNISolone (SOLU-MEDROL) injection  40 mg Intravenous Q12H  . pantoprazole  40 mg Oral BID AC  . sodium chloride  3 mL Intravenous Q12H  . sodium chloride  3 mL Intravenous Q12H  . spironolactone  25 mg Oral BID  . varenicline  0.5 mg Oral BID WC  . venlafaxine XR  37.5 mg Oral Q breakfast   Continuous Infusions:      Time spent: 35 minutes    [facility] A  [facility] Pager [phone] If 7PM-7AM, please contact night-coverage at [email or web address], password [facility] 03/18/2015, 2:31  PM  LOS: 2 days

## 2015-03-18 NOTE — Progress Notes (Signed)
[name], female   DOB: 12/29/1965, 49 y.o.   MRN: [id number]    SUBJECTIVE: IV Lasix started after RHC yesterday.  Patient diuresed well, weight is down.  Coughing some.  RHC Procedural Findings: Hemodynamics (mmHg) RA mean 16 RV 73/22 PA 70/32, mean 47 PCWP mean 20 Oxygen saturations: PA 69% AO 96% Cardiac Output (Fick) 6.56  Cardiac Index (Fick) 2.74 PVR 4.1 WU Cardiac Output (Thermo) 4.95 Cardiac Index (Thermo) 2.07 PVR 5.5 WU  Scheduled Meds: . ampicillin-sulbactam (UNASYN) IV  3 g Intravenous Q6H  . budesonide-formoterol  2 puff Inhalation BID  . fluticasone  2 spray Each Nare Daily  . furosemide  80 mg Intravenous BID  . guaiFENesin  600 mg Oral BID  . heparin  5,000 Units Subcutaneous 3 times per day  . insulin aspart  0-15 Units Subcutaneous TID WC  . insulin aspart  0-5 Units Subcutaneous QHS  . insulin aspart  5 Units Subcutaneous TID WC  . insulin glargine  40 Units Subcutaneous BID  . ipratropium-albuterol  3 mL Nebulization Q4H  . methylPREDNISolone (SOLU-MEDROL) injection  60 mg Intravenous 4 times per day  . pantoprazole  40 mg Oral BID AC  . sodium chloride  3 mL Intravenous Q12H  . sodium chloride  3 mL Intravenous Q12H  . spironolactone  25 mg Oral BID  . varenicline  0.5 mg Oral BID WC  . venlafaxine XR  37.5 mg Oral Q breakfast   Continuous Infusions:  PRN Meds:.sodium chloride, sodium chloride, acetaminophen, ALPRAZolam, HYDROcodone-acetaminophen, ondansetron (ZOFRAN) IV, sodium chloride, sodium chloride    Filed Vitals:   03/18/15 0001 03/18/15 0113 03/18/15 0347 03/18/15 0626  BP:  144/65  116/50  Pulse:  78  71  Temp:  97.7 F (36.5 C)  97.5 F (36.4 C)  TempSrc:  Oral  Oral  Resp:  18  18  Height:      Weight:      SpO2: 91% 92% 92% 95%    Intake/Output Summary (Last 24 hours) at 03/18/15 0731 Last data filed at 03/18/15 6314  Gross per 24 hour  Intake   1220 ml  Output   6650 ml  Net  -5430 ml     LABS: Basic Metabolic Panel:  Recent Labs  03/15/15 2044  03/17/15 0419 03/18/15 0414  NA 136  < > 133* 133*  K 3.3*  < > 4.2 4.0  CL 95*  < > 92* 93*  CO2 29  < > 27 31  GLUCOSE 162*  < > 350* 376*  BUN 8  < > 16 18  CREATININE 1.12*  < > 1.11* 1.13*  CALCIUM 8.7*  < > 8.3* 9.5  MG 1.4*  --   --   --   < > = values in this interval not displayed. Liver Function Tests:  Recent Labs  03/16/15 0407  AST 34  ALT 29  ALKPHOS 73  BILITOT 0.7  PROT 7.9  ALBUMIN 3.6   No results for input(s): LIPASE, AMYLASE in the last 72 hours. CBC:  Recent Labs  03/16/15 0407 03/17/15 0419  WBC 6.9 10.8*  NEUTROABS 6.4  --   HGB 13.6 13.2  HCT 43.0 41.5  MCV 78.6 77.7*  PLT 231 231   Cardiac Enzymes:  Recent Labs  03/16/15 0407 03/16/15 1000 03/16/15 1529  TROPONINI <0.03 <0.03 <0.03   BNP: Invalid input(s): POCBNP D-Dimer:  Recent Labs  03/15/15 2322  DDIMER 1.78*   Hemoglobin A1C: No  results for input(s): HGBA1C in the last 72 hours. Fasting Lipid Panel: No results for input(s): CHOL, HDL, LDLCALC, TRIG, CHOLHDL, LDLDIRECT in the last 72 hours. Thyroid Function Tests: No results for input(s): TSH, T4TOTAL, T3FREE, THYROIDAB in the last 72 hours.  Invalid input(s): FREET3 Anemia Panel: No results for input(s): VITAMINB12, FOLATE, FERRITIN, TIBC, IRON, RETICCTPCT in the last 72 hours.  RADIOLOGY: Ct Angio Chest Pe W/cm &/or Wo Cm  03/16/2015   CLINICAL DATA:  Acute onset of shortness of breath and elevated D-dimer. Initial encounter.  EXAM: CT ANGIOGRAPHY CHEST WITH CONTRAST  TECHNIQUE: Multidetector CT imaging of the chest was performed using the standard protocol during bolus administration of intravenous contrast. Multiplanar CT image reconstructions and MIPs were obtained to evaluate the vascular anatomy.  CONTRAST:  10mL OMNIPAQUE IOHEXOL 350 MG/ML SOLN  COMPARISON:  Chest radiograph performed 03/15/2015, and CT of the chest performed 12/21/2014   FINDINGS: There is no evidence of significant pulmonary embolus.  Nodular areas of airspace opacification in the lung bases are stable from March, and likely reflect prior progression of the patient's pulmonary sarcoidosis. No superimposed focal airspace consolidation is seen. There is underlying centrilobular and paraseptal emphysema, diffuse interstitial thickening, and bronchiectasis at the lung bases; these findings are stable from prior studies. No pleural effusion or pneumothorax is seen. No suspicious masses are identified.  The mediastinum is unremarkable in appearance. A borderline prominent 1.1 cm right paratracheal node is seen, and a 1.5 cm azygoesophageal recess node is noted. No pericardial effusion is identified. Small peribronchial nodes remain borderline normal in size. No axillary lymphadenopathy is seen. The visualized portions of the thyroid gland are unremarkable in appearance.  The visualized portions of the liver and spleen are unremarkable. The visualized portions of the pancreas, stomach, adrenal glands and kidneys are within normal limits.  No acute osseous abnormalities are seen.  Review of the MIP images confirms the above findings.  IMPRESSION: 1. No evidence of significant pulmonary embolus. 2. Stable nodular airspace opacification at the lung bases, reflecting prior progression of the patient's pulmonary sarcoidosis. No superimposed focal airspace consolidation seen. 3. Underlying centrilobular and paraseptal emphysema, diffuse interstitial thickening and bronchiectasis at the lung bases, stable from prior studies. 4. Enlarged mediastinal nodes likely reflect the patient's sarcoidosis.   Electronically Signed   By: Garald Balding M.D.   On: 03/16/2015 03:54   Dg Chest Portable 1 View  03/15/2015   CLINICAL DATA:  Shortness of breath, left chest pain ongoing for 2 days.  EXAM: PORTABLE CHEST - 1 VIEW  COMPARISON:  01/14/2015  FINDINGS: There is mild cardiomegaly. Bibasilar airspace  opacities have increased since prior study. No effusions. No acute bony abnormality.  IMPRESSION: Increasing bibasilar opacities. There may be underlying chronic scarring. Cannot exclude superimposed edema or infection.   Electronically Signed   By: Rolm Baptise M.D.   On: 03/15/2015 21:06   Dg Foot Complete Left  02/27/2015   CLINICAL DATA:  Left foot pain for 1 week.  EXAM: LEFT FOOT - COMPLETE 3+ VIEW  COMPARISON:  01/10/2012  FINDINGS: There is no acute fracture or dislocation or other acute abnormality. Old healed fracture of the proximal phalanx of the fourth toe. Minimal dorsal spurring at the first tarsal metatarsal joint.  IMPRESSION: No acute osseous abnormalities.   Electronically Signed   By: Lorriane Shire M.D.   On: 02/27/2015 13:06    PHYSICAL EXAM General: NAD, obese.  Neck: Thick, JVP difficult but suspect mild elevation, no thyromegaly  or thyroid nodule.  Lungs: Clear to auscultation bilaterally with normal respiratory effort. CV: Nondisplaced PMI.  Heart regular S1/S2, no S3/S4, no murmur.  Trace ankle edema.   Abdomen: Soft, nontender, no hepatosplenomegaly, no distention.  Neurologic: Alert and oriented x 3.  Psych: Normal affect. Extremities: No clubbing or cyanosis.   TELEMETRY: Reviewed telemetry pt in NSR  ASSESSMENT AND PLAN: 49 yo with history of chronic respiratory failure on home oxygen and CPAP with OSA, COPD, and sarcoidosis presented with COPD exacerbation and acute on chronic diastolic CHF with prominent RV dysfunction.  1. 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 She has quit smoking on Chantix. She has OHS/OSA on oxygen during the day and CPAP at night. She also has biopsy-proven sarcoidosis. She has had some changes on chest CT indicative of possible sarcoidosis progression, but per pulmonary note this does not seem to be her primary problem at this time. She has not been on steroids for sarcoidosis. She is being  treated this admission for COPD exacerbation. V/Q scan in 3/16 concerning for COPD versus chronic PE, but she has had several chest CTAs, including this admission, that are not indicative of acute or chronic PE.  - Plan to continue steroids and nebs for COPD exacerbation, sounds like from Dr Bari Mantis note that long-term steroids for sarcoidosis would not be warranted at this time.  - Continue oxygen and CPAP for OHS.  2. Pulmonary hypertension: Multiple possible reasons for pulmonary hypertension. PA systolic pressure estimate 106 mmHg by echo this admission. RHC (above) showed severe pulmonary arterial hypertension with some pulmonary venous hypertension. There is definitely a contribution from COPD and OHS/OSA (group 3). However, there could be a contribution from sarcoidosis.  - Diurese given elevated filling pressures.   - Will start her on ERA (macitentan) after left heart filling pressure optimized as there is some evidence for ERA use in sarcoidosis-related PAH. Will have the care manager start working on getting her approved for the medication.  3. Acute on chronic diastolic CHF with prominent RV dysfunction: Severely reduced RV function on echo.  R>L heart failure on cath.  She diuresed well yesterday. Continue IV Lasix today, convert back to her po torsemide tomorrow.   Loralie Champagne 03/18/2015 7:36 AM

## 2015-03-18 NOTE — Progress Notes (Signed)
   03/18/15 1000  Clinical Encounter Type  Visited With Patient;Health care provider  Visit Type Initial;Spiritual support;Social support  Spiritual Encounters  Spiritual Needs Emotional  CH met with MD regarding PT diagnosis; Chance net with PT after PT informed of diagnosis and discussed spiritual connection, attitude and outlook; PT was sitting up and would like additional visit; PT informed CH of marijuana usage as self-medicating for chronic pain; PT has minimal support network.  Burt would recommend visit from Palliative Care team to discuss long-term goals of care given diagnosis.

## 2015-03-18 NOTE — Progress Notes (Signed)
Paged Dr. Hartford Poli regarding pt complaints of a "strange headache".  Orders received. Will continue to monitor pt.       Maurene Capes RN

## 2015-03-18 NOTE — Progress Notes (Signed)
Inpatient Diabetes Program Recommendations  AACE/ADA: New Consensus Statement on Inpatient Glycemic Control (2013)  Target Ranges:  Prepandial:   less than 140 mg/dL      Peak postprandial:   less than 180 mg/dL (1-2 hours)      Critically ill patients:  140 - 180 mg/dL   Inpatient Diabetes Program Recommendations Insulin - Basal: . Correction (SSI): increase to RESISTANT scale during steroid therapy Insulin - Meal Coverage: .  Thank you  Raoul Pitch BSN, RN,CDE Inpatient Diabetes Coordinator [phone] (team pager)

## 2015-03-18 NOTE — Progress Notes (Signed)
Paged Dr. Hartford Poli regarding pt complaints of headache 10/10. Orders received. Pharmacy called to verify. Will continue to monitor pt.       Maurene Capes RN

## 2015-03-19 ENCOUNTER — Other Ambulatory Visit: Payer: Self-pay

## 2015-03-19 ENCOUNTER — Telehealth (HOSPITAL_COMMUNITY): Payer: Self-pay | Admitting: *Deleted

## 2015-03-19 LAB — BASIC METABOLIC PANEL
Anion gap: 12 (ref 5–15)
BUN: 23 mg/dL — AB (ref 6–20)
CO2: 31 mmol/L (ref 22–32)
CREATININE: 0.97 mg/dL (ref 0.44–1.00)
Calcium: 9.6 mg/dL (ref 8.9–10.3)
Chloride: 93 mmol/L — ABNORMAL LOW (ref 101–111)
GLUCOSE: 357 mg/dL — AB (ref 65–99)
Potassium: 4 mmol/L (ref 3.5–5.1)
Sodium: 136 mmol/L (ref 135–145)

## 2015-03-19 LAB — GLUCOSE, CAPILLARY
GLUCOSE-CAPILLARY: 465 mg/dL — AB (ref 65–99)
Glucose-Capillary: 327 mg/dL — ABNORMAL HIGH (ref 65–99)
Glucose-Capillary: 218 mg/dL — ABNORMAL HIGH (ref 65–99)
Glucose-Capillary: 318 mg/dL — ABNORMAL HIGH (ref 65–99)
Glucose-Capillary: 331 mg/dL — ABNORMAL HIGH (ref 65–99)

## 2015-03-19 LAB — BRAIN NATRIURETIC PEPTIDE: B NATRIURETIC PEPTIDE 5: 61.9 pg/mL (ref 0.0–100.0)

## 2015-03-19 MED ORDER — HYDROMORPHONE HCL 1 MG/ML IJ SOLN
0.5000 mg | Freq: Once | INTRAMUSCULAR | Status: AC
  Administered 2015-03-19: 0.5 mg via INTRAVENOUS
  Filled 2015-03-19: qty 1

## 2015-03-19 MED ORDER — INSULIN GLARGINE 100 UNIT/ML ~~LOC~~ SOLN
50.0000 [IU] | Freq: Two times a day (BID) | SUBCUTANEOUS | Status: DC
  Administered 2015-03-19 – 2015-03-21 (×4): 50 [IU] via SUBCUTANEOUS
  Filled 2015-03-19 (×5): qty 0.5

## 2015-03-19 MED ORDER — NITROGLYCERIN 0.4 MG SL SUBL
0.4000 mg | SUBLINGUAL_TABLET | SUBLINGUAL | Status: DC | PRN
  Administered 2015-03-19: 0.4 mg via SUBLINGUAL

## 2015-03-19 MED ORDER — HYDROCOD POLST-CPM POLST ER 10-8 MG/5ML PO SUER
5.0000 mL | Freq: Two times a day (BID) | ORAL | Status: DC | PRN
  Administered 2015-03-19: 5 mL via ORAL
  Filled 2015-03-19: qty 5

## 2015-03-19 MED ORDER — TORSEMIDE 100 MG PO TABS
100.0000 mg | ORAL_TABLET | Freq: Two times a day (BID) | ORAL | Status: DC
  Administered 2015-03-19 – 2015-03-20 (×3): 100 mg via ORAL
  Filled 2015-03-19 (×6): qty 1

## 2015-03-19 MED ORDER — INSULIN ASPART 100 UNIT/ML ~~LOC~~ SOLN
10.0000 [IU] | Freq: Three times a day (TID) | SUBCUTANEOUS | Status: DC
  Administered 2015-03-19 (×2): 10 [IU] via SUBCUTANEOUS

## 2015-03-19 MED ORDER — INSULIN ASPART 100 UNIT/ML ~~LOC~~ SOLN
6.0000 [IU] | Freq: Once | SUBCUTANEOUS | Status: AC
  Administered 2015-03-19: 6 [IU] via SUBCUTANEOUS

## 2015-03-19 MED ORDER — PREDNISONE 20 MG PO TABS
40.0000 mg | ORAL_TABLET | Freq: Every day | ORAL | Status: DC
  Administered 2015-03-19: 40 mg via ORAL
  Filled 2015-03-19 (×2): qty 2

## 2015-03-19 NOTE — Care Management Note (Signed)
Case Management Note  Patient Details  Name: [name] MRN: [id number] Date of Birth: June 25, 1966  Subjective/Objective:           Admitted with COPD         Action/Plan: CM talked to patient about DCP; patient lives alone, has a personal care service and has a nurses aide daily to assist with her care. [place] choices offered, Patient chose [facility] for home health care services. Mary with [facility] called for arrangements. Rolator/ Bariatric ordered through [facility] and will be delivered to her home at discharge. PCP is Dr Basilio Cairo, she has private insurance with [facility] , pharmacy of choice is [facility] and stated that she does not have any problems getting her medication. She has scales and weigh herself daily when she remembers. Patient has home 02 through [facility].  Benefit check for Macitentan completed- CM contacted [facility] [place] and he has completed the prior authorization for the medication.   Expected Discharge Date:   03/19/2015               Expected Discharge Plan:  [place]  Discharge planning Services  CM Consult   Choice offered to:  Patient  DME Arranged:  Walker rolling with seat DME Agency:  [facility] Arranged:  RN, Disease Management, PT, OT [place] Agency:  Other - See comment  Status of Service:  In process, will continue to follow:     Sherrilyn Rist [phone] 03/19/2015, 11:00 AM

## 2015-03-19 NOTE — Progress Notes (Signed)
BIPAP is setup at bedside. Pt puts on when she is ready for sleep. RT will monitor.

## 2015-03-19 NOTE — Progress Notes (Signed)
1520 Pt calls me to inform me she has 7/10 chest pain. Pain is not radiating and onset started after she coughed up "big amount of phlem". Vitals taken. EKG done. Sublingual nitro given. Doctor paged.  1535 pt feels some relief from sublingual nitro. States she feels it is "muscular from coughing". Pt wanting medication to relief "muscular pain".   Paged Dr. Hartford Poli regarding pt complaint of muscular chest pain at 1520. Will continue to monitor pt.       Maurene Capes RN

## 2015-03-19 NOTE — Progress Notes (Signed)
CARDIAC REHAB PHASE I   PRE:  Rate/Rhythm: 77 SR  BP:  Sitting: 147/77        SaO2: 97 4L   MODE:  Ambulation: 200 ft   POST:  Rate/Rhythm: 86 SR  BP:  Sitting: 152/68         SaO2: 87 on 6L, 93 on 6L after 1 minute rest, 97 on 4L after 3 minutes rest  Pt ambulated 200 ft on 4-6L O2, rolling walker, steady gait, standby assist, tolerated fair. Standing rest x1 at halfway point. Pt 84% on 4L O2 during ambulation, increased to 6L, 87-93% on 6L O2 with ambulation. Pt reports DOE, unchanged from baseline, denies CP, dizziness. Pt would benefit from [facility], case manager aware. Completed CHF education. Reviewed risk factors, activity restrictions, exercise guidelines, CHF booklet and zone tool, daily weights, sodium and fluid restrictions, heart healthy diet, carb counting, portion control and phase 2 cardiac rehab. Pt verbalized understanding. Pt states she is overwhelmed but ready to make changes. Pt interested and agreeable to phase 2 cardiac rehab if qualifies. Will send referral to [facility]. Pt to chair after walk, call bell within reach. Encouraged to ambulate again with staff if possible prior to discharge.   [id number]  Lenna Sciara, RN, BSN 03/19/2015 10:50 AM

## 2015-03-19 NOTE — Telephone Encounter (Signed)
Called [facility] at [phone] (ID #[id number]) completed PA for Opsumit, med was approved through 03/18/16, [id number]

## 2015-03-19 NOTE — Progress Notes (Signed)
Inpatient Diabetes Program Recommendations  AACE/ADA: New Consensus Statement on Inpatient Glycemic Control (2013)  Target Ranges:  Prepandial:   less than 140 mg/dL      Peak postprandial:   less than 180 mg/dL (1-2 hours)      Critically ill patients:  140 - 180 mg/dL   Inpatient Diabetes Program Recommendations Insulin - Basal: While continuing solumedrol therapy, please consider an increase in basal lantus to 45 units bid as well as incarease correction to resistant per below. Correction (SSI): increase to RESISTANT scale during steroid therapy Insulin - Meal Coverage: .  Thank you Rosita Kea, RN, MSN, CDE  Diabetes Inpatient Program Office: [phone] Pager: [phone] 8:00 am to 5:00 pm

## 2015-03-19 NOTE — Progress Notes (Signed)
[facility] PROGRESS NOTE   [name] VHQ:[id number] DOB: September 20, 1966 DOA: 03/15/2015 PCP: Gwendolyn Grant, MD  HPI/Subjective: Feels much better, large amounts of urine To date she is 15.5 L negative since admission. Renal function is still normal. Diuresis changed to torsemide orally.  Assessment/Plan: Principal Problem:   COPD exacerbation Active Problems:   Essential hypertension   Chronic respiratory failure   Fibromyalgia   Obesity hypoventilation syndrome   Diabetes type 2, uncontrolled   Chronic diastolic CHF (congestive heart failure)   COPD (chronic obstructive pulmonary disease) with emphysema   Pulmonary HTN   Hypokalemia   Hypomagnesemia   Elevated d-dimer   On home oxygen therapy    COPD exacerbation The patient is presenting with complains of significantly worsening shortness of breath at her baseline. She appears to be in significant distress and use if necessary muscles. She is still on her 3 L of oxygen at home with that. Chest x-ray is showing bilateral basilar opacities which are not present on the earlier chest x-ray. He has ordered a CT scan of the chest which I will follow. Most likely her presentation appears to be secondary to COPD flareup. On Solu-Medrol switched to prednisone, duo nebs, Mucinex, flutter device. On Unasyn, likely can be discharged in the morning.  Pulmonary hypertension severe Continuing home medication which she says she is compliant with. Cardiac catheterization done showed moderate elevation of right heart filling pressure, suggesting R > L heart failure. Per cardiology start Opsumit when it is approved. Believably large amounts of urine, she urinated over 23 L for the past 4 days, kept negative balance of 16 L.  Obesity hypoventilation syndrome. Patient mentions she is using BiPAP daily at bedtime and a regular basis. We will continue the same.  Hypokalemia and hypomagnesemia. Currently  replacing.  Diabetes mellitus hemoglobin A1c March 16 9.4. Patient mentions he is compliant with her insulin. Blood sugar worsened because of steroids.  Added 5 units of NovoLog with meals along with SSI and Levemir increased to 15 units twice a day.  Facial plethora, hoarseness of voice, facial puffiness. CT of the chest with help ruling out the thoracic outlet syndrome/superior vena cava thrombosis.  Severe headache MRI of the brain is negative for acute findings.   Code Status: Full Code Family Communication: Plan discussed with the patient. Disposition Plan: Remains inpatient Diet: Diet Carb Modified Fluid consistency:: Thin; Room service appropriate?: Yes; Fluid restriction:: 1200 mL Fluid  Consultants:  Cardiology   Pulmonology  Procedures:  Cardiac catheterization done on 03/17/2015 by Dr. Aundra Dubin Conclusion    1. Mildly elevated left heart filling pressure and moderately elevated right heart filling pressure suggesting R > L heart failure.  2. Severe pulmonary hypertension with elevated PVR. 3. Preserved cardiac output.   Plan:  1. Will diurese with IV Lasix today, 80 mg IV bid.  2. Will plan to start ERA (Opsumit) when get approval.      Antibiotics:  None   Objective: Filed Vitals:   03/19/15 1346  BP: 130/58  Pulse: 84  Temp: 97.8 F (36.6 C)  Resp: 20    Intake/Output Summary (Last 24 hours) at 03/19/15 1500 Last data filed at 03/19/15 1339  Gross per 24 hour  Intake   2540 ml  Output  10100 ml  Net  -7560 ml   Filed Weights   03/16/15 0250 03/17/15 0508 03/19/15 0500  Weight: 130.908 kg (288 lb 9.6 oz) 130.137 kg (286 lb 14.4 oz) 134.038 kg (295 lb 8  oz)    Exam: General: Alert and awake, oriented x3, not in any acute distress. HEENT: anicteric sclera, pupils reactive to light and accommodation, EOMI CVS: S1-S2 clear, no murmur rubs or gallops Chest: clear to auscultation bilaterally, no wheezing, rales or rhonchi Abdomen: soft  nontender, nondistended, normal bowel sounds, no organomegaly Extremities: no cyanosis, clubbing or edema noted bilaterally Neuro: Cranial nerves II-XII intact, no focal neurological deficits  Data Reviewed: Basic Metabolic Panel:  Recent Labs Lab 03/15/15 2044 03/16/15 0407 03/17/15 0419 03/18/15 0414 03/19/15 0441  NA 136 132* 133* 133* 136  K 3.3* 4.1 4.2 4.0 4.0  CL 95* 92* 92* 93* 93*  CO2 29 25 27 31 31   GLUCOSE 162* 338* 350* 376* 357*  BUN 8 11 16 18  23*  CREATININE 1.12* 1.25* 1.11* 1.13* 0.97  CALCIUM 8.7* 8.5* 8.3* 9.5 9.6  MG 1.4*  --   --   --   --    Liver Function Tests:  Recent Labs Lab 03/16/15 0407  AST 34  ALT 29  ALKPHOS 73  BILITOT 0.7  PROT 7.9  ALBUMIN 3.6   No results for input(s): LIPASE, AMYLASE in the last 168 hours. No results for input(s): AMMONIA in the last 168 hours. CBC:  Recent Labs Lab 03/15/15 2038 03/16/15 0407 03/17/15 0419  WBC 6.6 6.9 10.8*  NEUTROABS  --  6.4  --   HGB 13.9 13.6 13.2  HCT 42.4 43.0 41.5  MCV 76.7* 78.6 77.7*  PLT 190 231 231   Cardiac Enzymes:  Recent Labs Lab 03/16/15 0407 03/16/15 1000 03/16/15 1529  TROPONINI <0.03 <0.03 <0.03   BNP (last 3 results)  Recent Labs  12/13/14 0820 12/18/14 1846 03/19/15 0441  BNP 29.7 231.0* 61.9    ProBNP (last 3 results)  Recent Labs  04/17/14 0120 07/20/14 1817  PROBNP 44.7 39.7    CBG:  Recent Labs Lab 03/18/15 1715 03/18/15 2122 03/19/15 0114 03/19/15 0607 03/19/15 1128  GLUCAP 350* 358* 465* 327* 218*    Micro Recent Results (from the past 240 hour(s))  MRSA PCR Screening     Status: None   Collection Time: 03/16/15  2:40 AM  Result Value Ref Range Status   MRSA by PCR NEGATIVE NEGATIVE Final    Comment:        The GeneXpert MRSA Assay (FDA approved for NASAL specimens only), is one component of a comprehensive MRSA colonization surveillance program. It is not intended to diagnose MRSA infection nor to guide  or monitor treatment for MRSA infections.      Studies: Mr Herby Abraham Contrast  03/18/2015   CLINICAL DATA:  49 year old female with severe headache. Initial encounter. Current history of sarcoidosis, obstructive sleep apnea.  EXAM: MRI HEAD WITHOUT CONTRAST  TECHNIQUE: Multiplanar, multiecho pulse sequences of the brain and surrounding structures were obtained without intravenous contrast.  COMPARISON:  Neck CT 02/03/2011.  FINDINGS: The examination had to be discontinued prior to completion due to patient factors.  Cerebral volume is normal. No restricted diffusion to suggest acute infarction. No midline shift, mass effect, evidence of mass lesion, ventriculomegaly, extra-axial collection or acute intracranial hemorrhage. Cervicomedullary junction and pituitary are within normal limits. Major intracranial vascular flow voids are within normal limits.  No T2* imaging. Only sagittal T1 weighted imaging. Pearline Cables and white matter signal is within normal limits for age throughout the brain.  Grossly negative visualized internal auditory structures. Visualized paranasal sinuses and mastoids are clear. Orbits soft tissues appear normal. Grossly negative visualized  cervical spine. Bone marrow signal appears normal.  IMPRESSION: 1. Negative noncontrast MRI appearance of the brain. 2.  The examination had to be discontinued prior to completion.   Electronically Signed   By: Genevie Ann M.D.   On: 03/18/2015 20:51    Scheduled Meds: . ampicillin-sulbactam (UNASYN) IV  3 g Intravenous Q6H  . budesonide-formoterol  2 puff Inhalation BID  . fluticasone  2 spray Each Nare Daily  . guaiFENesin  1,200 mg Oral BID  . heparin  5,000 Units Subcutaneous 3 times per day  . insulin aspart  0-15 Units Subcutaneous TID WC  . insulin aspart  10 Units Subcutaneous TID WC  . insulin glargine  50 Units Subcutaneous BID  . ipratropium-albuterol  3 mL Nebulization TID  . nicotine  14 mg Transdermal Daily  . pantoprazole  40 mg Oral  BID AC  . predniSONE  40 mg Oral Q breakfast  . sodium chloride  3 mL Intravenous Q12H  . sodium chloride  3 mL Intravenous Q12H  . spironolactone  25 mg Oral BID  . torsemide  100 mg Oral BID  . venlafaxine XR  37.5 mg Oral Q breakfast   Continuous Infusions:      Time spent: 35 minutes    [facility] A  [facility] Pager [phone] If 7PM-7AM, please contact night-coverage at [email or web address], password [facility] 03/19/2015, 3:00 PM  LOS: 3 days

## 2015-03-19 NOTE — Progress Notes (Signed)
[name], female   DOB: 05-Jul-1966, 49 y.o.   MRN: [id number]    SUBJECTIVE: Had "strange headache" yesterday 10/10 but MRI negative for acute process.  Out another 5.2 L yesterday.  Would like to know if she has the option of a home nurse for a little while once she transitions to home.  RHC Procedural Findings: Hemodynamics (mmHg) RA mean 16 RV 73/22 PA 70/32, mean 47 PCWP mean 20 Oxygen saturations: PA 69% AO 96% Cardiac Output (Fick) 6.56  Cardiac Index (Fick) 2.74 PVR 4.1 WU Cardiac Output (Thermo) 4.95 Cardiac Index (Thermo) 2.07 PVR 5.5 WU  Scheduled Meds: . ampicillin-sulbactam (UNASYN) IV  3 g Intravenous Q6H  . budesonide-formoterol  2 puff Inhalation BID  . fluticasone  2 spray Each Nare Daily  . furosemide  80 mg Intravenous BID  . guaiFENesin  1,200 mg Oral BID  . heparin  5,000 Units Subcutaneous 3 times per day  . insulin aspart  0-15 Units Subcutaneous TID WC  . insulin aspart  0-5 Units Subcutaneous QHS  . insulin aspart  5 Units Subcutaneous TID WC  . insulin glargine  40 Units Subcutaneous BID  . ipratropium-albuterol  3 mL Nebulization TID  . methylPREDNISolone (SOLU-MEDROL) injection  40 mg Intravenous Q12H  . nicotine  14 mg Transdermal Daily  . pantoprazole  40 mg Oral BID AC  . sodium chloride  3 mL Intravenous Q12H  . sodium chloride  3 mL Intravenous Q12H  . spironolactone  25 mg Oral BID  . venlafaxine XR  37.5 mg Oral Q breakfast   Continuous Infusions:  PRN Meds:.sodium chloride, sodium chloride, acetaminophen, albuterol, ALPRAZolam, HYDROcodone-acetaminophen, ondansetron (ZOFRAN) IV, sodium chloride, sodium chloride    Filed Vitals:   03/18/15 2156 03/19/15 0059 03/19/15 0500 03/19/15 0607  BP:  119/60  137/68  Pulse: 94 77  79  Temp:    97.2 F (36.2 C)  TempSrc:    Oral  Resp: 20   18  Height:      Weight:   295 lb 8 oz (134.038 kg)   SpO2: 97%   96%    Intake/Output Summary (Last 24 hours) at 03/19/15  0708 Last data filed at 03/19/15 0559  Gross per 24 hour  Intake   2440 ml  Output   7700 ml  Net  -5260 ml    LABS: Basic Metabolic Panel:  Recent Labs  03/18/15 0414 03/19/15 0441  NA 133* 136  K 4.0 4.0  CL 93* 93*  CO2 31 31  GLUCOSE 376* 357*  BUN 18 23*  CREATININE 1.13* 0.97  CALCIUM 9.5 9.6   Liver Function Tests: No results for input(s): AST, ALT, ALKPHOS, BILITOT, PROT, ALBUMIN in the last 72 hours. No results for input(s): LIPASE, AMYLASE in the last 72 hours. CBC:  Recent Labs  03/17/15 0419  WBC 10.8*  HGB 13.2  HCT 41.5  MCV 77.7*  PLT 231   Cardiac Enzymes:  Recent Labs  03/16/15 1000 03/16/15 1529  TROPONINI <0.03 <0.03   BNP: Invalid input(s): POCBNP D-Dimer: No results for input(s): DDIMER in the last 72 hours. Hemoglobin A1C: No results for input(s): HGBA1C in the last 72 hours. Fasting Lipid Panel: No results for input(s): CHOL, HDL, LDLCALC, TRIG, CHOLHDL, LDLDIRECT in the last 72 hours. Thyroid Function Tests: No results for input(s): TSH, T4TOTAL, T3FREE, THYROIDAB in the last 72 hours.  Invalid input(s): FREET3 Anemia Panel: No results for input(s): VITAMINB12, FOLATE, FERRITIN, TIBC, IRON, RETICCTPCT in  the last 72 hours.  RADIOLOGY: Ct Angio Chest Pe W/cm &/or Wo Cm  03/16/2015   CLINICAL DATA:  Acute onset of shortness of breath and elevated D-dimer. Initial encounter.  EXAM: CT ANGIOGRAPHY CHEST WITH CONTRAST  TECHNIQUE: Multidetector CT imaging of the chest was performed using the standard protocol during bolus administration of intravenous contrast. Multiplanar CT image reconstructions and MIPs were obtained to evaluate the vascular anatomy.  CONTRAST:  63mL OMNIPAQUE IOHEXOL 350 MG/ML SOLN  COMPARISON:  Chest radiograph performed 03/15/2015, and CT of the chest performed 12/21/2014  FINDINGS: There is no evidence of significant pulmonary embolus.  Nodular areas of airspace opacification in the lung bases are stable from  March, and likely reflect prior progression of the patient's pulmonary sarcoidosis. No superimposed focal airspace consolidation is seen. There is underlying centrilobular and paraseptal emphysema, diffuse interstitial thickening, and bronchiectasis at the lung bases; these findings are stable from prior studies. No pleural effusion or pneumothorax is seen. No suspicious masses are identified.  The mediastinum is unremarkable in appearance. A borderline prominent 1.1 cm right paratracheal node is seen, and a 1.5 cm azygoesophageal recess node is noted. No pericardial effusion is identified. Small peribronchial nodes remain borderline normal in size. No axillary lymphadenopathy is seen. The visualized portions of the thyroid gland are unremarkable in appearance.  The visualized portions of the liver and spleen are unremarkable. The visualized portions of the pancreas, stomach, adrenal glands and kidneys are within normal limits.  No acute osseous abnormalities are seen.  Review of the MIP images confirms the above findings.  IMPRESSION: 1. No evidence of significant pulmonary embolus. 2. Stable nodular airspace opacification at the lung bases, reflecting prior progression of the patient's pulmonary sarcoidosis. No superimposed focal airspace consolidation seen. 3. Underlying centrilobular and paraseptal emphysema, diffuse interstitial thickening and bronchiectasis at the lung bases, stable from prior studies. 4. Enlarged mediastinal nodes likely reflect the patient's sarcoidosis.   Electronically Signed   By: Garald Balding M.D.   On: 03/16/2015 03:54   Mr Brain Wo Contrast  03/18/2015   CLINICAL DATA:  49 year old female with severe headache. Initial encounter. Current history of sarcoidosis, obstructive sleep apnea.  EXAM: MRI HEAD WITHOUT CONTRAST  TECHNIQUE: Multiplanar, multiecho pulse sequences of the brain and surrounding structures were obtained without intravenous contrast.  COMPARISON:  Neck CT 02/03/2011.   FINDINGS: The examination had to be discontinued prior to completion due to patient factors.  Cerebral volume is normal. No restricted diffusion to suggest acute infarction. No midline shift, mass effect, evidence of mass lesion, ventriculomegaly, extra-axial collection or acute intracranial hemorrhage. Cervicomedullary junction and pituitary are within normal limits. Major intracranial vascular flow voids are within normal limits.  No T2* imaging. Only sagittal T1 weighted imaging. Pearline Cables and white matter signal is within normal limits for age throughout the brain.  Grossly negative visualized internal auditory structures. Visualized paranasal sinuses and mastoids are clear. Orbits soft tissues appear normal. Grossly negative visualized cervical spine. Bone marrow signal appears normal.  IMPRESSION: 1. Negative noncontrast MRI appearance of the brain. 2.  The examination had to be discontinued prior to completion.   Electronically Signed   By: Genevie Ann M.D.   On: 03/18/2015 20:51   Dg Chest Portable 1 View  03/15/2015   CLINICAL DATA:  Shortness of breath, left chest pain ongoing for 2 days.  EXAM: PORTABLE CHEST - 1 VIEW  COMPARISON:  01/14/2015  FINDINGS: There is mild cardiomegaly. Bibasilar airspace opacities have increased since prior  study. No effusions. No acute bony abnormality.  IMPRESSION: Increasing bibasilar opacities. There may be underlying chronic scarring. Cannot exclude superimposed edema or infection.   Electronically Signed   By: Rolm Baptise M.D.   On: 03/15/2015 21:06   Dg Foot Complete Left  02/27/2015   CLINICAL DATA:  Left foot pain for 1 week.  EXAM: LEFT FOOT - COMPLETE 3+ VIEW  COMPARISON:  01/10/2012  FINDINGS: There is no acute fracture or dislocation or other acute abnormality. Old healed fracture of the proximal phalanx of the fourth toe. Minimal dorsal spurring at the first tarsal metatarsal joint.  IMPRESSION: No acute osseous abnormalities.   Electronically Signed   By: Lorriane Shire M.D.   On: 02/27/2015 13:06    PHYSICAL EXAM General: NAD, obese.  Neck: Thick, JVP difficult continue to suspect mild elevation, no thyromegaly or thyroid nodule.  Lungs: Clear to auscultation bilaterally with normal respiratory effort. CV: Nondisplaced PMI.  Heart regular S1/S2, no S3/S4, no murmur.  Trace ankle edema.   Abdomen: Soft, nontender, no hepatosplenomegaly, no distention.  Neurologic: Alert and oriented x 3.  Psych: Normal affect. Extremities: No clubbing or cyanosis.   TELEMETRY: Reviewed telemetry pt in NSR  ASSESSMENT AND PLAN: 49 yo with history of chronic respiratory failure on home oxygen and CPAP with OSA, COPD, and sarcoidosis presented with COPD exacerbation and acute on chronic diastolic CHF with prominent RV dysfunction.  1. 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 She has quit smoking on Chantix. She has OHS/OSA on oxygen during the day and CPAP at night. She also has biopsy-proven sarcoidosis. She has had some changes on chest CT indicative of possible sarcoidosis progression, but per pulmonary note this does not seem to be her primary problem at this time. She has not been on steroids for sarcoidosis. She is being treated this admission for COPD exacerbation. V/Q scan in 3/16 concerning for COPD versus chronic PE, but she has had several chest CTAs, including this admission, that are not indicative of acute or chronic PE.  - Plan to continue steroids and nebs for COPD exacerbation, sounds like from Dr Bari Mantis note that long-term steroids for sarcoidosis would not be warranted at this time.  - Continue oxygen and CPAP for OHS.  2. Pulmonary hypertension: Multiple possible reasons for pulmonary hypertension. PA systolic pressure estimate 106 mmHg by echo this admission. RHC (above) showed severe pulmonary arterial hypertension with some pulmonary venous hypertension. There is definitely a contribution from COPD  and OHS/OSA (group 3). However, there could be a contribution from sarcoidosis.  - Diurese given elevated filling pressures.   - Will start her on ERA (macitentan) after left heart filling pressure optimized as there is some evidence for ERA use in sarcoidosis-related PAH. Will have the care manager start working on getting her approved for the medication.  3. Acute on chronic diastolic CHF with prominent RV dysfunction: Severely reduced RV function on echo.  R>L heart failure on cath.  She diuresed well yesterday.  - Out 5.2 L yesterday, but weight somehow up from two days ago. - Continue IV lasix with great diuresis vs switching to po torsemide.  - Creatinine improved Cr 1.11 -> 1.13 -> 0.97 today  Shirley Friar PA-C 03/19/2015 7:08 AM  Patient seen with PA, agree with the above note. She diuresed very well again today.  I am not sure how to interpret her weights but I/Os very negative.  - Convert back over to  po torsemide 100 mg bid.  - Will work on getting her Opsumit 10 mg daily for PAH, think this can start as soon as approval from her insurance can be arranged.   Loralie Champagne 03/19/2015 7:39 AM

## 2015-03-19 NOTE — Progress Notes (Signed)
Benefit check for  Opsumit 10 mg daily for PAH Nia A Shealy CMA            Prior Josem Kaufmann is required-[phone]- pt co pay will be $3.60

## 2015-03-19 NOTE — Patient Outreach (Signed)
[facility]) Care Management  03/19/2015  MAE DENUNZIO 1966/04/20 [id number]   Referral from Natividad Brood, RN, assigned Erenest Rasher, RN.  Consent for [facility] Care Management saved in chart under media tab.  [name]. [place], [place] Management [place] Assistant Phone: [phone] Fax: [phone]

## 2015-03-19 NOTE — Consult Note (Signed)
   [facility] CM Inpatient Consult   03/19/2015  [name] 29-Oct-1965 [id number] Spoke with patient at bedside regarding the restart of services with [place] Management. Patient verbalized her interest in [place] Management services. Patient signed consent form and packet with information given for disease management support or other services. Patient will receive post hospital follow up calls and be assessed for home visits. Of note, [facility] Care Management services does not replace or interfere with any services that are arranged by inpatient case management or social work. For additional questions or referrals please contact. Natividad Brood, RN BSN [facility] Liaison  [phone] business mobile phone

## 2015-03-20 LAB — MAGNESIUM: Magnesium: 2 mg/dL (ref 1.7–2.4)

## 2015-03-20 LAB — GLUCOSE, CAPILLARY
GLUCOSE-CAPILLARY: 241 mg/dL — AB (ref 65–99)
GLUCOSE-CAPILLARY: 281 mg/dL — AB (ref 65–99)
Glucose-Capillary: 230 mg/dL — ABNORMAL HIGH (ref 65–99)
Glucose-Capillary: 214 mg/dL — ABNORMAL HIGH (ref 65–99)

## 2015-03-20 LAB — BASIC METABOLIC PANEL
ANION GAP: 11 (ref 5–15)
BUN: 26 mg/dL — ABNORMAL HIGH (ref 6–20)
CO2: 35 mmol/L — AB (ref 22–32)
Calcium: 8.4 mg/dL — ABNORMAL LOW (ref 8.9–10.3)
Chloride: 88 mmol/L — ABNORMAL LOW (ref 101–111)
Creatinine, Ser: 1.2 mg/dL — ABNORMAL HIGH (ref 0.44–1.00)
GFR calc non Af Amer: 53 mL/min — ABNORMAL LOW (ref >60)
Glucose, Bld: 228 mg/dL — ABNORMAL HIGH (ref 65–99)
POTASSIUM: 3.1 mmol/L — AB (ref 3.5–5.1)
SODIUM: 134 mmol/L — AB (ref 135–145)

## 2015-03-20 MED ORDER — POTASSIUM CHLORIDE CRYS ER 20 MEQ PO TBCR: 40.0000 meq | EXTENDED_RELEASE_TABLET | Freq: Four times a day (QID) | ORAL | Status: DC

## 2015-03-20 MED ORDER — VENLAFAXINE HCL ER 37.5 MG PO CP24
37.5000 mg | ORAL_CAPSULE | Freq: Every day | ORAL | Status: DC
  Administered 2015-03-20 – 2015-03-21 (×2): 37.5 mg via ORAL
  Filled 2015-03-20 (×3): qty 1

## 2015-03-20 MED ORDER — INSULIN ASPART 100 UNIT/ML ~~LOC~~ SOLN
10.0000 [IU] | Freq: Three times a day (TID) | SUBCUTANEOUS | Status: DC
  Administered 2015-03-20 – 2015-03-21 (×5): 10 [IU] via SUBCUTANEOUS

## 2015-03-20 MED ORDER — POTASSIUM CHLORIDE CRYS ER 20 MEQ PO TBCR
60.0000 meq | EXTENDED_RELEASE_TABLET | Freq: Four times a day (QID) | ORAL | Status: AC
Start: 2015-03-20 — End: 2015-03-20
  Administered 2015-03-20 (×2): 60 meq via ORAL
  Filled 2015-03-20 (×2): qty 3

## 2015-03-20 MED ORDER — HYDROMORPHONE HCL 1 MG/ML IJ SOLN
0.5000 mg | Freq: Once | INTRAMUSCULAR | Status: AC
  Administered 2015-03-20: 0.5 mg via INTRAVENOUS
  Filled 2015-03-20: qty 1

## 2015-03-20 MED ORDER — PREDNISONE 20 MG PO TABS
40.0000 mg | ORAL_TABLET | Freq: Every day | ORAL | Status: DC
  Administered 2015-03-20 – 2015-03-21 (×2): 40 mg via ORAL
  Filled 2015-03-20 (×3): qty 2

## 2015-03-20 NOTE — Progress Notes (Signed)
Pt placed self on BiPAP and is resting comfortably. Pt knows to call for RT if she needs any help during the night

## 2015-03-20 NOTE — Progress Notes (Signed)
Pt not ready for cpap at this time, RT informed pt to call for RT when ready to go on and RT would help place pt on cpap for the night

## 2015-03-20 NOTE — Progress Notes (Signed)
[name], female   DOB: September 22, 1966, 49 y.o.   MRN: [id number]     Subjective:    Still with some SOB  Objective:   Temp:  [97.5 F (36.4 C)-98.2 F (36.8 C)] 97.5 F (36.4 C) (06/25 0505) Pulse Rate:  [78-88] 78 (06/25 0505) Resp:  [18-20] 18 (06/25 0505) BP: (120-152)/(58-79) 120/70 mmHg (06/25 0505) SpO2:  [96 %-100 %] 97 % (06/25 0842) Weight:  [293 lb 4.8 oz (133.04 kg)] 293 lb 4.8 oz (133.04 kg) (06/25 0505) Last BM Date: 03/15/15  Filed Weights   03/17/15 0508 03/19/15 0500 03/20/15 0505  Weight: 286 lb 14.4 oz (130.137 kg) 295 lb 8 oz (134.038 kg) 293 lb 4.8 oz (133.04 kg)    Intake/Output Summary (Last 24 hours) at 03/20/15 0914 Last data filed at 03/20/15 5035  Gross per 24 hour  Intake   2180 ml  Output  10700 ml  Net  -8520 ml    Telemetry: NSR  Exam:  General: NAD  Resp: mild crackles bilateral bases  Cardiac: RRR, no m/r/g, no JVD  GI: abdomen soft, NT, ND  MSK: no LE edema  Neuro: no focal deficits   Lab Results:  Basic Metabolic Panel:  Recent Labs Lab 03/15/15 2044  03/18/15 0414 03/19/15 0441 03/20/15 0415  NA 136  < > 133* 136 134*  K 3.3*  < > 4.0 4.0 3.1*  CL 95*  < > 93* 93* 88*  CO2 29  < > 31 31 35*  GLUCOSE 162*  < > 376* 357* 228*  BUN 8  < > 18 23* 26*  CREATININE 1.12*  < > 1.13* 0.97 1.20*  CALCIUM 8.7*  < > 9.5 9.6 8.4*  MG 1.4*  --   --   --   --   < > = values in this interval not displayed.  Liver Function Tests:  Recent Labs Lab 03/16/15 0407  AST 34  ALT 29  ALKPHOS 73  BILITOT 0.7  PROT 7.9  ALBUMIN 3.6    CBC:  Recent Labs Lab 03/15/15 2038 03/16/15 0407 03/17/15 0419  WBC 6.6 6.9 10.8*  HGB 13.9 13.6 13.2  HCT 42.4 43.0 41.5  MCV 76.7* 78.6 77.7*  PLT 190 231 231    Cardiac Enzymes:  Recent Labs Lab 03/16/15 0407 03/16/15 1000 03/16/15 1529  TROPONINI <0.03 <0.03 <0.03    BNP:  Recent Labs  04/17/14 0120 07/20/14 1817  PROBNP 44.7 39.7     Coagulation:  Recent Labs Lab 03/16/15 0407 03/17/15 0419  INR 1.13 1.12    ECG:   Medications:   Scheduled Medications: . ampicillin-sulbactam (UNASYN) IV  3 g Intravenous Q6H  . budesonide-formoterol  2 puff Inhalation BID  . fluticasone  2 spray Each Nare Daily  . guaiFENesin  1,200 mg Oral BID  . heparin  5,000 Units Subcutaneous 3 times per day  . insulin aspart  0-15 Units Subcutaneous TID WC  . insulin aspart  10 Units Subcutaneous TID WC  . insulin glargine  50 Units Subcutaneous BID  . ipratropium-albuterol  3 mL Nebulization TID  . nicotine  14 mg Transdermal Daily  . pantoprazole  40 mg Oral BID AC  . predniSONE  40 mg Oral Q breakfast  . sodium chloride  3 mL Intravenous Q12H  . sodium chloride  3 mL Intravenous Q12H  . spironolactone  25 mg Oral BID  . torsemide  100 mg Oral BID  . venlafaxine XR  37.5  mg Oral Q breakfast     Infusions:     PRN Medications:  sodium chloride, sodium chloride, acetaminophen, albuterol, ALPRAZolam, chlorpheniramine-HYDROcodone, HYDROcodone-acetaminophen, nitroGLYCERIN, ondansetron (ZOFRAN) IV, sodium chloride, sodium chloride     Assessment/Plan    1. Acute on chronic diastolic heart failure - echo 11/2014 LVEF 24-58%, grade I diastolic dysfunction, severe RV dysfunction, PASP 106 mmHg.  - negative 7.2 liters yesterday according to charting, negative 20 liters since admission. She received torsemide 100mg  po bid yesterday. Cr trending up today, will hold diuretics (she got her AM dose of torsemide). Once resumed will need lower dose of torsemide due to massive diuresis (negative 7 liters) on 100mg  bid.  - would monitor today and repeat renal function in AM.   2. 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 - plans per CHF team are to diurese and then  start the endothelin receptor antagonist macitentan, awaiting insurance approval, case management notes reviewed.   3. Hypokalemia - secondary to diuretics - will write for 40 mEq po q 6 hrs x 2 doses - will add on Mg to labs from this AM      Carlyle Dolly, M.D.

## 2015-03-20 NOTE — Progress Notes (Signed)
CARDIAC REHAB PHASE I   PRE:  Rate/Rhythm: 84 SR  BP:  Sitting: 145/89      SaO2: 98 4L at rest  MODE:  Ambulation: 300 ft   POST:  Rate/Rhythm: 97 SR  BP:  Sitting: 151/84     SaO2: 79 with ambulation on 4L, increased to 84 on 6L with               Rest break and pursed lip breathing. Back to 96% post                                       Walk with rest.  [id number] Patient ambulated in hallway with RW. Steady gait noted. Multiple standing rest breaks taken for SOB. Desaturation noted on pulse ox. Encouraged patient to purse lip breath. Post ambulation patient back to bedside sitting with call bell and phone in reach. Patient complained of mild chest pressure. Primary RN notified. Discussed out patient pulmonary rehab with patient. Has been referred in past but unable to afford program. Will contact patient post discharge to discuss program and insurance coverage in further detail.  Santina Evans, BSN 03/20/2015 10:06 AM

## 2015-03-20 NOTE — Progress Notes (Signed)
[facility] PROGRESS NOTE   [name] IRC:[id number] DOB: 08/19/1966 DOA: 03/15/2015 PCP: Gwendolyn Grant, MD  HPI/Subjective: Feels better, still has some shortness of breath. Creatinine increased 1.2, not far from her baseline for the past 6 months creatinine fluctuating between 0.9 and 1.2. Likely for discharge in a.m. if okay with cardiology Replete hypokalemia.  Assessment/Plan: Principal Problem:   COPD exacerbation Active Problems:   Essential hypertension   Chronic respiratory failure   Fibromyalgia   Obesity hypoventilation syndrome   Diabetes type 2, uncontrolled   Chronic diastolic CHF (congestive heart failure)   COPD (chronic obstructive pulmonary disease) with emphysema   Pulmonary HTN   Hypokalemia   Hypomagnesemia   Elevated d-dimer   On home oxygen therapy    COPD exacerbation The patient is presenting with complains of significantly worsening shortness of breath at her baseline. She appears to be in significant distress and use if necessary muscles. She is still on her 3 L of oxygen at home with that. Chest x-ray is showing bilateral basilar opacities which are not present on the earlier chest x-ray. He has ordered a CT scan of the chest which I will follow. Most likely her presentation appears to be secondary to COPD flareup. On Solu-Medrol switched to prednisone, duo nebs, Mucinex, flutter device. Cardiology recommended to keep overnight likely for discharge in a.m.  Pulmonary hypertension severe Continuing home medication which she says she is compliant with. Cardiac catheterization done showed moderate elevation of right heart filling pressure, suggesting R > L heart failure. Per cardiology start Opsumit when it is approved. Believably large amounts of urine, she urinated over 23 L for the past 4 days, kept negative balance of 16 L.  Obesity hypoventilation syndrome. Patient mentions she is using BiPAP daily at bedtime and a  regular basis. We will continue the same.  Hypokalemia and hypomagnesemia. Currently replacing.  Diabetes mellitus hemoglobin A1c March 16 9.4. Patient mentions he is compliant with her insulin. Blood sugar worsened because of steroids.  Added 5 units of NovoLog with meals along with SSI and Levemir increased to 15 units twice a day.  Facial plethora, hoarseness of voice, facial puffiness. CT of the chest with help ruling out the thoracic outlet syndrome/superior vena cava thrombosis.  Severe headache MRI of the brain is negative for acute findings.  Hypokalemia Replete with oral supplements.   Code Status: Full Code Family Communication: Plan discussed with the patient. Disposition Plan: Remains inpatient Diet: Diet Carb Modified Fluid consistency:: Thin; Room service appropriate?: Yes; Fluid restriction:: 1200 mL Fluid  Consultants:  Cardiology   Pulmonology  Procedures:  Cardiac catheterization done on 03/17/2015 by Dr. Aundra Dubin Conclusion    1. Mildly elevated left heart filling pressure and moderately elevated right heart filling pressure suggesting R > L heart failure.  2. Severe pulmonary hypertension with elevated PVR. 3. Preserved cardiac output.   Plan:  1. Will diurese with IV Lasix today, 80 mg IV bid.  2. Will plan to start ERA (Opsumit) when get approval.      Antibiotics:  None   Objective: Filed Vitals:   03/20/15 1354  BP: 101/51  Pulse: 83  Temp: 97.6 F (36.4 C)  Resp: 20    Intake/Output Summary (Last 24 hours) at 03/20/15 1600 Last data filed at 03/20/15 1354  Gross per 24 hour  Intake   1860 ml  Output   8500 ml  Net  -6640 ml   Filed Weights   03/17/15 0508 03/19/15 0500 03/20/15  0505  Weight: 130.137 kg (286 lb 14.4 oz) 134.038 kg (295 lb 8 oz) 133.04 kg (293 lb 4.8 oz)    Exam: General: Alert and awake, oriented x3, not in any acute distress. HEENT: anicteric sclera, pupils reactive to light and accommodation,  EOMI CVS: S1-S2 clear, no murmur rubs or gallops Chest: clear to auscultation bilaterally, no wheezing, rales or rhonchi Abdomen: soft nontender, nondistended, normal bowel sounds, no organomegaly Extremities: no cyanosis, clubbing or edema noted bilaterally Neuro: Cranial nerves II-XII intact, no focal neurological deficits  Data Reviewed: Basic Metabolic Panel:  Recent Labs Lab 03/15/15 2044 03/16/15 0407 03/17/15 0419 03/18/15 0414 03/19/15 0441 03/20/15 0415 03/20/15 0940  NA 136 132* 133* 133* 136 134*  --   K 3.3* 4.1 4.2 4.0 4.0 3.1*  --   CL 95* 92* 92* 93* 93* 88*  --   CO2 29 25 27 31 31  35*  --   GLUCOSE 162* 338* 350* 376* 357* 228*  --   BUN 8 11 16 18  23* 26*  --   CREATININE 1.12* 1.25* 1.11* 1.13* 0.97 1.20*  --   CALCIUM 8.7* 8.5* 8.3* 9.5 9.6 8.4*  --   MG 1.4*  --   --   --   --   --  2.0   Liver Function Tests:  Recent Labs Lab 03/16/15 0407  AST 34  ALT 29  ALKPHOS 73  BILITOT 0.7  PROT 7.9  ALBUMIN 3.6   No results for input(s): LIPASE, AMYLASE in the last 168 hours. No results for input(s): AMMONIA in the last 168 hours. CBC:  Recent Labs Lab 03/15/15 2038 03/16/15 0407 03/17/15 0419  WBC 6.6 6.9 10.8*  NEUTROABS  --  6.4  --   HGB 13.9 13.6 13.2  HCT 42.4 43.0 41.5  MCV 76.7* 78.6 77.7*  PLT 190 231 231   Cardiac Enzymes:  Recent Labs Lab 03/16/15 0407 03/16/15 1000 03/16/15 1529  TROPONINI <0.03 <0.03 <0.03   BNP (last 3 results)  Recent Labs  12/13/14 0820 12/18/14 1846 03/19/15 0441  BNP 29.7 231.0* 61.9    ProBNP (last 3 results)  Recent Labs  04/17/14 0120 07/20/14 1817  PROBNP 44.7 39.7    CBG:  Recent Labs Lab 03/19/15 1128 03/19/15 1646 03/19/15 2050 03/20/15 0557 03/20/15 1115  GLUCAP 218* 318* 331* 230* 241*    Micro Recent Results (from the past 240 hour(s))  MRSA PCR Screening     Status: None   Collection Time: 03/16/15  2:40 AM  Result Value Ref Range Status   MRSA by PCR  NEGATIVE NEGATIVE Final    Comment:        The GeneXpert MRSA Assay (FDA approved for NASAL specimens only), is one component of a comprehensive MRSA colonization surveillance program. It is not intended to diagnose MRSA infection nor to guide or monitor treatment for MRSA infections.      Studies: Mr Herby Abraham Contrast  03/18/2015   CLINICAL DATA:  49 year old female with severe headache. Initial encounter. Current history of sarcoidosis, obstructive sleep apnea.  EXAM: MRI HEAD WITHOUT CONTRAST  TECHNIQUE: Multiplanar, multiecho pulse sequences of the brain and surrounding structures were obtained without intravenous contrast.  COMPARISON:  Neck CT 02/03/2011.  FINDINGS: The examination had to be discontinued prior to completion due to patient factors.  Cerebral volume is normal. No restricted diffusion to suggest acute infarction. No midline shift, mass effect, evidence of mass lesion, ventriculomegaly, extra-axial collection or acute intracranial hemorrhage. Cervicomedullary junction  and pituitary are within normal limits. Major intracranial vascular flow voids are within normal limits.  No T2* imaging. Only sagittal T1 weighted imaging. Pearline Cables and white matter signal is within normal limits for age throughout the brain.  Grossly negative visualized internal auditory structures. Visualized paranasal sinuses and mastoids are clear. Orbits soft tissues appear normal. Grossly negative visualized cervical spine. Bone marrow signal appears normal.  IMPRESSION: 1. Negative noncontrast MRI appearance of the brain. 2.  The examination had to be discontinued prior to completion.   Electronically Signed   By: Genevie Ann M.D.   On: 03/18/2015 20:51    Scheduled Meds: . ampicillin-sulbactam (UNASYN) IV  3 g Intravenous Q6H  . budesonide-formoterol  2 puff Inhalation BID  . fluticasone  2 spray Each Nare Daily  . guaiFENesin  1,200 mg Oral BID  . heparin  5,000 Units Subcutaneous 3 times per day  . insulin  aspart  0-15 Units Subcutaneous TID WC  . insulin aspart  10 Units Subcutaneous TID WC  . insulin glargine  50 Units Subcutaneous BID  . ipratropium-albuterol  3 mL Nebulization TID  . nicotine  14 mg Transdermal Daily  . pantoprazole  40 mg Oral BID AC  . potassium chloride  60 mEq Oral Q6H  . predniSONE  40 mg Oral Q breakfast  . sodium chloride  3 mL Intravenous Q12H  . sodium chloride  3 mL Intravenous Q12H  . spironolactone  25 mg Oral BID  . venlafaxine XR  37.5 mg Oral Q breakfast   Continuous Infusions:      Time spent: 35 minutes    [facility]  [facility] Pager [phone] If 7PM-7AM, please contact night-coverage at [email or web address], password [facility] 03/20/2015, 4:00 PM  LOS: 4 days

## 2015-03-21 LAB — BASIC METABOLIC PANEL
Anion gap: 8 (ref 5–15)
BUN: 23 mg/dL — AB (ref 6–20)
CO2: 33 mmol/L — ABNORMAL HIGH (ref 22–32)
CREATININE: 1.02 mg/dL — AB (ref 0.44–1.00)
Calcium: 8.8 mg/dL — ABNORMAL LOW (ref 8.9–10.3)
Chloride: 95 mmol/L — ABNORMAL LOW (ref 101–111)
GFR calc Af Amer: >60 mL/min (ref >60)
GFR calc non Af Amer: >60 mL/min (ref >60)
Glucose, Bld: 172 mg/dL — ABNORMAL HIGH (ref 65–99)
Potassium: 3.7 mmol/L (ref 3.5–5.1)
Sodium: 136 mmol/L (ref 135–145)

## 2015-03-21 LAB — GLUCOSE, CAPILLARY
GLUCOSE-CAPILLARY: 124 mg/dL — AB (ref 65–99)
GLUCOSE-CAPILLARY: 159 mg/dL — AB (ref 65–99)

## 2015-03-21 MED ORDER — PREDNISONE 10 MG (21) PO TBPK: ORAL_TABLET | ORAL | Status: DC

## 2015-03-21 MED ORDER — POTASSIUM CHLORIDE CRYS ER 20 MEQ PO TBCR
40.0000 meq | EXTENDED_RELEASE_TABLET | Freq: Once | ORAL | Status: AC
  Administered 2015-03-21: 40 meq via ORAL
  Filled 2015-03-21: qty 2

## 2015-03-21 MED ORDER — TORSEMIDE 20 MG PO TABS
40.0000 mg | ORAL_TABLET | Freq: Two times a day (BID) | ORAL | Status: DC
  Filled 2015-03-21 (×2): qty 2

## 2015-03-21 MED ORDER — POTASSIUM CHLORIDE CRYS ER 20 MEQ PO TBCR: 40.0000 meq | EXTENDED_RELEASE_TABLET | Freq: Two times a day (BID) | ORAL | Status: DC

## 2015-03-21 MED ORDER — TORSEMIDE 20 MG PO TABS: 40.0000 mg | ORAL_TABLET | Freq: Two times a day (BID) | ORAL | Status: DC

## 2015-03-21 NOTE — Progress Notes (Signed)
Renal function has improved. Will start torsemide 40mg  bid. [place] for patient to be discharged today, I will contact our weekend NP to arrange CHF clinic f/u in 1 week. At that time can f/u on her approval for her pulmonary HTN medication.   Zandra Abts MD

## 2015-03-21 NOTE — Care Management Note (Addendum)
Case Management Note  Patient Details  Name: [name] MRN: [id number] Date of Birth: [birth year]  Subjective/Objective:                    Action/Plan:   Expected Discharge Date:                 Expected Discharge Plan:  [place]  In-House Referral:     Discharge planning Services  CM Consult  Post Acute Care Choice:    Choice offered to:  Patient  DME Arranged:  3-N-1 DME Agency:  [place] Arranged:  RN, Disease Management, PT, OT [place] Agency:  Other - See comment  Status of Service:  In process, will continue to follow  Medicare Important Message Given:    Date Medicare IM Given:    Medicare IM give by:    Date Additional Medicare IM Given:    Additional Medicare Important Message give by:     If discussed at [place] of Stay Meetings, dates discussed:    Additional Comments: CM received call from RN as pt forgot to have ride get her home oxygen for transport home.  CM called Mandy of [employer] who states she will have a tank delivered to room prior to discharge today.  CM spoke with pt who states she is set up with [facility] for HHPT/OT/RN (though no note in this system).  Cm called [facility] rep, Rhett Bannister who confirms setup was Friday. CM called [facility] DME rep, Jeneen Rinks to please deliver the rolator walker and Jeneen Rinks states pt needs to call [facility] to arrange a pick up time at the [facility] address for the rolator (this information provided on the AVS for pt)  No other CM needs were communicated. Dellie Catholic, RN 03/21/2015, 2:55 PM

## 2015-03-21 NOTE — Discharge Summary (Signed)
Physician Discharge Summary  [name]:[id number] DOB: September 26, 1965 DOA: 03/15/2015  PCP: Gwendolyn Grant, MD  Admit date: 03/15/2015 Discharge date: 03/21/2015  Time spent: 40 minutes  Recommendations for Outpatient Follow-up:  1. Follow-up with primary care physician within one week. 2. Follow-up with the heart failure clinic on 04/05/2015. 3. Check CBC/BMP the next office visit, torsemide decreased from 100 twice a day to 40 mg twice a day.  Discharge Diagnoses:  Principal Problem:   COPD exacerbation Active Problems:   Essential hypertension   Chronic respiratory failure   Fibromyalgia   Obesity hypoventilation syndrome   Diabetes type 2, uncontrolled   Chronic diastolic CHF (congestive heart failure)   COPD (chronic obstructive pulmonary disease) with emphysema   Pulmonary HTN   Hypokalemia   Hypomagnesemia   Elevated d-dimer   On home oxygen therapy   Discharge Condition: Stable  Diet recommendation: Heart healthy  Filed Weights   03/20/15 0505 03/21/15 0659 03/21/15 1129  Weight: 133.04 kg (293 lb 4.8 oz) 132.882 kg (292 lb 15.2 oz) 134.3 kg (296 lb 1.2 oz)    History of present illness:  [name] is a 49 y.o. female with Past medical history of obstructive sleep apnea on BiPAP, chronic diastolic CHF, pulmonary hypertension, fibromyalgia, pulmonary sarcoidosis, essential hypertension, dyslipidemia, COPD. The patient is presenting with complaints of cough with expectoration as well as shortness of breath on ongoing since last 2 days. She mentions she was at her baseline 2 days ago but has been having increasing fatigue and weakness since last one week. She also has been having increased sleepiness with lethargy. She mentions since last 2 days she has been having cough and later on earlier on the morning of 03/15/2015 she started having yellowish expectoration as well as some fever. Along with that her shortness of breath was progressively worsening  and it was not improving with routine treatment at home and therefore she decided to come to the hospital. She denies any chest pain chest pressure the time of my evaluation but did have some chest pain earlier. She denies any dizziness or lightheadedness she denies any nausea but did help his symptoms earlier as well. No vomiting no abdominal pain no diarrhea no constipation no burning urination no leg swelling. She mentions he is compliant with all her medication and mentions he is using BiPAP at every night.  The patient is coming from home And at her baseline independent for most of her ADL.   Hospital Course:   COPD exacerbation The patient is presenting with complains of significantly worsening shortness of breath at her baseline. She appears to be in significant distress and use if necessary muscles. She is still on her 3 L of oxygen at home with that. Chest x-ray is showing bilateral basilar opacities which are not present on the earlier chest x-ray. He has ordered a CT scan of the chest which I will follow. Most likely her presentation appears to be secondary to COPD flareup. On Solu-Medrol switched to prednisone, duo nebs, Mucinex, flutter device. Discharge on prednisone taper, patient been on antibiotics in the hospital for 7 days no need for antibiotics and discharge.  Pulmonary hypertension severe Continuing home medication which she says she is compliant with. Cardiac catheterization done showed moderate elevation of right heart filling pressure, suggesting R > L heart failure. Per cardiology start Opsumit when it is approved. This is to be started in the CHF clinic. Per chart report patient had negative fluid balance of 22.8  L, this did not translate in weight loss for some reason. Cardiology recommended to decrease torsemide to 40 mg twice a day.  Obesity hypoventilation syndrome. Patient mentions she is using BiPAP daily at bedtime and a regular basis. We will continue  the same.  Hypokalemia and hypomagnesemia. Secondary to aggressive diuresis, replace with oral supplements.  Diabetes mellitus hemoglobin A1c March 16 9.4. Patient mentions he is compliant with her insulin. Blood sugar worsened because of steroids.  Discharge home on her home dose of Lantus and NovoLog.  Facial plethora, hoarseness of voice, facial puffiness. CT of the chest with help ruling out the thoracic outlet syndrome/superior vena cava thrombosis.  Severe headache MRI of the brain is negative for acute findings.   Procedures:  None  Consultations:  None  Discharge Exam: Filed Vitals:   03/21/15 1332  BP: 129/46  Pulse: 80  Temp: 98.1 F (36.7 C)  Resp: 20   General: Alert and awake, oriented x3, not in any acute distress. HEENT: anicteric sclera, pupils reactive to light and accommodation, EOMI CVS: S1-S2 clear, no murmur rubs or gallops Chest: clear to auscultation bilaterally, no wheezing, rales or rhonchi Abdomen: soft nontender, nondistended, normal bowel sounds, no organomegaly Extremities: no cyanosis, clubbing or edema noted bilaterally Neuro: Cranial nerves II-XII intact, no focal neurological deficits  Discharge Instructions   Discharge Instructions    AMB Referral to [facility]    Complete by:  As directed   Reason for consult:  HF exacerbation; COPD; frequent admissions - need community follow up  Diagnoses of:   Heart Failure COPD/ Pneumonia Diabetes    Expected date of contact:  1-3 days (reserved for hospital discharges)  Please assign to community nurse for transition of care calls and assess for home visits. Patient will also start being followed by the HF clinic. Questions please call.     Amb Referral to Cardiac Rehabilitation    Complete by:  As directed   Congestive Heart Failure: If diagnosis is Heart Failure, patient MUST meet each of the CMS criteria: 1. Left Ventricular Ejection Fraction </= 35% 2. NYHA class II-IV  symptoms despite being on optimal heart failure therapy for at least 6 weeks. 3. Stable = have not had a recent (<6 weeks) or planned (<6 months) major cardiovascular hospitalization or procedure  Program Details: - Physician supervised classes - 1-3 classes per week over a 12-18 week period, generally for a total of 36 sessions  Physician Certification: I certify that the above Cardiac Rehabilitation treatment is medically necessary and is medically approved by me for treatment of this patient. The patient is willing and cooperative, able to ambulate and medically stable to participate in exercise rehabilitation. The participant's progress and Individualized Treatment Plan will be reviewed by the Medical Director, Cardiac Rehab staff and as indicated by the Referring/Ordering Physician.  Diagnosis:  Heart Failure (see criteria below)  Heart Failure Type:  Chronic Diastolic     Diet - low sodium heart healthy    Complete by:  As directed      Increase activity slowly    Complete by:  As directed           Current Discharge Medication List    START taking these medications   Details  predniSONE (STERAPRED UNI-PAK 21 TAB) 10 MG (21) TBPK tablet Take 4 tabs orally by mouth for 2 days, then take 3 tabs orally by mouth for 2 days, then take 2 tabs orally by mouth for 2 days,  then take 1 tabs orally by mouth for 2 days, then stop. Qty: 21 tablet, Refills: 0      CONTINUE these medications which have CHANGED   Details  potassium chloride SA (K-DUR,KLOR-CON) 20 MEQ tablet Take 2 tablets (40 mEq total) by mouth 2 (two) times daily. Qty: 180 tablet, Refills: 11   Associated Diagnoses: Acute on chronic diastolic CHF (congestive heart failure)    torsemide (DEMADEX) 20 MG tablet Take 2 tablets (40 mg total) by mouth 2 (two) times daily. Qty: 80 tablet, Refills: 0      CONTINUE these medications which have NOT CHANGED   Details  albuterol (PROAIR HFA) 108 (90 BASE) MCG/ACT inhaler Inhale 2  puffs into the lungs every 6 (six) hours as needed for wheezing or shortness of breath. Qty: 8.5 g, Refills: 0    albuterol (PROVENTIL) (2.5 MG/3ML) 0.083% nebulizer solution Take 3 mLs (2.5 mg total) by nebulization every 6 (six) hours as needed for wheezing or shortness of breath. Qty: 75 mL, Refills: 2    ALPRAZolam (XANAX) 1 MG tablet Take 1 tablet (1 mg total) by mouth 3 (three) times daily. Qty: 90 tablet, Refills: 3    aspirin EC 81 MG tablet Take 81 mg by mouth daily.    BIOTIN PO Take 1 capsule by mouth daily.    budesonide-formoterol (SYMBICORT) 160-4.5 MCG/ACT inhaler Inhale 2 puffs into the lungs 2 (two) times daily.    cetirizine (ZYRTEC) 1 MG/ML syrup Take 10 mLs (10 mg total) by mouth at bedtime. Qty: 300 mL, Refills: 0    diclofenac sodium (VOLTAREN) 1 % GEL Apply 4 g topically 4 (four) times daily. Qty: 100 g, Refills: 0    EMBEDA 20-0.8 MG CPCR Take 1 tablet by mouth daily. Refills: 0    esomeprazole (NEXIUM) 40 MG capsule Take 40 mg by mouth 2 (two) times daily before a meal.    fluticasone (FLONASE) 50 MCG/ACT nasal spray Place 2 sprays into both nostrils daily. Qty: 16 g, Refills: 5    gabapentin (NEURONTIN) 300 MG capsule Take 300 mg by mouth daily as needed (neuropathy pain).     glimepiride (AMARYL) 1 MG tablet Take 1 mg by mouth daily with breakfast.    hydrOXYzine (ATARAX/VISTARIL) 10 MG tablet TAKE 1 TABLET BY MOUTH THREE TIMES DAILY AS NEEDED FOR ITCHING Qty: 30 tablet, Refills: 11    insulin aspart (NOVOLOG FLEXPEN) 100 UNIT/ML FlexPen Inject 5 Units into the skin 3 (three) times daily with meals. Qty: 21 mL, Refills: 0    Insulin Glargine (LANTUS SOLOSTAR) 100 UNIT/ML Solostar Pen Inject 40 Units into the skin 2 (two) times daily. Qty: 15 mL, Refills: 0    Multiple Vitamin (MULTIVITAMIN) capsule Take 1 capsule by mouth once a week.     omega-3 fish oil (MAXEPA) 1000 MG CAPS capsule Take 1 capsule by mouth daily.    promethazine (PHENERGAN)  25 MG tablet Take 1 tablet (25 mg total) by mouth every 6 (six) hours as needed for nausea or vomiting. for nausea Qty: 30 tablet, Refills: 0    SPIRIVA HANDIHALER 18 MCG inhalation capsule INHALE THE CONTENTS OF 1 CAPSULE VIA INHALATION DEVICE EVERY DAY Qty: 30 capsule, Refills: 0    spironolactone (ALDACTONE) 25 MG tablet Take 25 mg by mouth 2 (two) times daily.    tiZANidine (ZANAFLEX) 2 MG tablet Take 2 mg by mouth 2 (two) times daily as needed for muscle spasms.  Refills: 3    varenicline (CHANTIX) 0.5 MG tablet  Take 1 tablet (0.5 mg total) by mouth 2 (two) times daily. Qty: 60 tablet, Refills: 3    venlafaxine XR (EFFEXOR XR) 37.5 MG 24 hr capsule Take 1 capsule (37.5 mg total) by mouth daily with breakfast. Qty: 30 capsule, Refills: 3    fluconazole (DIFLUCAN) 150 MG tablet Take 1 tablet (150 mg total) by mouth once. Qty: 1 tablet, Refills: 0    HYDROcodone-acetaminophen (NORCO) 5-325 MG per tablet Take 1-2 tablets by mouth every 6 (six) hours as needed for moderate pain. Qty: 15 tablet, Refills: 0    nystatin (MYCOSTATIN) 100000 UNITS/ML SUSP Take 1 mL by mouth 3 (three) times daily. Swish solution then swallow. Qty: 120 mL, Refills: 0       Allergies  Allergen Reactions  . Simvastatin Other (See Comments)    Severe leg pain and burning  . Sulfamethoxazole-Trimethoprim Nausea And Vomiting    Causes Projectile Vomiting  . Zolpidem Tartrate Other (See Comments)    Chest pain  . Azithromycin Other (See Comments)    Resistant to med  . Ceftin [Cefuroxime] Swelling and Other (See Comments)    MOUTH SWELLING  . Doxycycline Other (See Comments)    Resistant to med  . Latex Itching and Rash    Also burning sensations  . Metformin And Related Diarrhea    Severe diarrhea  . Metolazone Other (See Comments)    MYALGIAS  . Metronidazole Nausea And Vomiting  . Ciprofloxacin Nausea Only  . Tramadol Itching and Nausea Only   Follow-up Information    Follow up with [facility]   Specialty:  [place]   Why:  they will provide your home health care at your home   Contact information:   [address] [place] [address] [phone]       Follow up with Loralie Champagne, MD On 04/05/2015.   Specialty:  Cardiology   Why:  2:40 PM   Contact information:   [address] [place] [address] [phone]       Follow up with Scarlette Calico, MD On 03/25/2015.   Specialty:  Internal Medicine   Why:  2:00 PM   Contact information:   [address] [address] [phone]        The results of significant diagnostics from this hospitalization (including imaging, microbiology, ancillary and laboratory) are listed below for reference.    Significant Diagnostic Studies: Ct Angio Chest Pe W/cm &/or Wo Cm  03/16/2015   CLINICAL DATA:  Acute onset of shortness of breath and elevated D-dimer. Initial encounter.  EXAM: CT ANGIOGRAPHY CHEST WITH CONTRAST  TECHNIQUE: Multidetector CT imaging of the chest was performed using the standard protocol during bolus administration of intravenous contrast. Multiplanar CT image reconstructions and MIPs were obtained to evaluate the vascular anatomy.  CONTRAST:  28mL OMNIPAQUE IOHEXOL 350 MG/ML SOLN  COMPARISON:  Chest radiograph performed 03/15/2015, and CT of the chest performed 12/21/2014  FINDINGS: There is no evidence of significant pulmonary embolus.  Nodular areas of airspace opacification in the lung bases are stable from March, and likely reflect prior progression of the patient's pulmonary sarcoidosis. No superimposed focal airspace consolidation is seen. There is underlying centrilobular and paraseptal emphysema, diffuse interstitial thickening, and bronchiectasis at the lung bases; these findings are stable from prior studies. No pleural effusion or pneumothorax is seen. No suspicious masses are identified.  The mediastinum is unremarkable in appearance.  A borderline prominent 1.1 cm  right paratracheal node is seen, and a 1.5 cm azygoesophageal recess node is noted. No pericardial effusion is identified. Small peribronchial nodes remain borderline normal in size. No axillary lymphadenopathy is seen. The visualized portions of the thyroid gland are unremarkable in appearance.  The visualized portions of the liver and spleen are unremarkable. The visualized portions of the pancreas, stomach, adrenal glands and kidneys are within normal limits.  No acute osseous abnormalities are seen.  Review of the MIP images confirms the above findings.  IMPRESSION: 1. No evidence of significant pulmonary embolus. 2. Stable nodular airspace opacification at the lung bases, reflecting prior progression of the patient's pulmonary sarcoidosis. No superimposed focal airspace consolidation seen. 3. Underlying centrilobular and paraseptal emphysema, diffuse interstitial thickening and bronchiectasis at the lung bases, stable from prior studies. 4. Enlarged mediastinal nodes likely reflect the patient's sarcoidosis.   Electronically Signed   By: Garald Balding M.D.   On: 03/16/2015 03:54   Mr Brain Wo Contrast  03/18/2015   CLINICAL DATA:  49 year old female with severe headache. Initial encounter. Current history of sarcoidosis, obstructive sleep apnea.  EXAM: MRI HEAD WITHOUT CONTRAST  TECHNIQUE: Multiplanar, multiecho pulse sequences of the brain and surrounding structures were obtained without intravenous contrast.  COMPARISON:  Neck CT 02/03/2011.  FINDINGS: The examination had to be discontinued prior to completion due to patient factors.  Cerebral volume is normal. No restricted diffusion to suggest acute infarction. No midline shift, mass effect, evidence of mass lesion, ventriculomegaly, extra-axial collection or acute intracranial hemorrhage. Cervicomedullary junction and pituitary are within normal limits. Major intracranial vascular flow voids are within normal limits.  No T2*  imaging. Only sagittal T1 weighted imaging. Pearline Cables and white matter signal is within normal limits for age throughout the brain.  Grossly negative visualized internal auditory structures. Visualized paranasal sinuses and mastoids are clear. Orbits soft tissues appear normal. Grossly negative visualized cervical spine. Bone marrow signal appears normal.  IMPRESSION: 1. Negative noncontrast MRI appearance of the brain. 2.  The examination had to be discontinued prior to completion.   Electronically Signed   By: Genevie Ann M.D.   On: 03/18/2015 20:51   Dg Chest Portable 1 View  03/15/2015   CLINICAL DATA:  Shortness of breath, left chest pain ongoing for 2 days.  EXAM: PORTABLE CHEST - 1 VIEW  COMPARISON:  01/14/2015  FINDINGS: There is mild cardiomegaly. Bibasilar airspace opacities have increased since prior study. No effusions. No acute bony abnormality.  IMPRESSION: Increasing bibasilar opacities. There may be underlying chronic scarring. Cannot exclude superimposed edema or infection.   Electronically Signed   By: Rolm Baptise M.D.   On: 03/15/2015 21:06   Dg Foot Complete Left  02/27/2015   CLINICAL DATA:  Left foot pain for 1 week.  EXAM: LEFT FOOT - COMPLETE 3+ VIEW  COMPARISON:  01/10/2012  FINDINGS: There is no acute fracture or dislocation or other acute abnormality. Old healed fracture of the proximal phalanx of the fourth toe. Minimal dorsal spurring at the first tarsal metatarsal joint.  IMPRESSION: No acute osseous abnormalities.   Electronically Signed   By: Lorriane Shire M.D.   On: 02/27/2015 13:06    Microbiology: Recent Results (from the past 240 hour(s))  MRSA PCR Screening     Status: None   Collection Time: 03/16/15  2:40 AM  Result Value Ref Range Status   MRSA by PCR NEGATIVE NEGATIVE Final    Comment:        The GeneXpert MRSA Assay (  FDA approved for NASAL specimens only), is one component of a comprehensive MRSA colonization surveillance program. It is not intended to diagnose  MRSA infection nor to guide or monitor treatment for MRSA infections.      Labs: Basic Metabolic Panel:  Recent Labs Lab 03/15/15 2044  03/17/15 0419 03/18/15 0414 03/19/15 0441 03/20/15 0415 03/20/15 0940 03/21/15 0900  NA 136  < > 133* 133* 136 134*  --  136  K 3.3*  < > 4.2 4.0 4.0 3.1*  --  3.7  CL 95*  < > 92* 93* 93* 88*  --  95*  CO2 29  < > 27 31 31  35*  --  33*  GLUCOSE 162*  < > 350* 376* 357* 228*  --  172*  BUN 8  < > 16 18 23* 26*  --  23*  CREATININE 1.12*  < > 1.11* 1.13* 0.97 1.20*  --  1.02*  CALCIUM 8.7*  < > 8.3* 9.5 9.6 8.4*  --  8.8*  MG 1.4*  --   --   --   --   --  2.0  --   < > = values in this interval not displayed. Liver Function Tests:  Recent Labs Lab 03/16/15 0407  AST 34  ALT 29  ALKPHOS 73  BILITOT 0.7  PROT 7.9  ALBUMIN 3.6   No results for input(s): LIPASE, AMYLASE in the last 168 hours. No results for input(s): AMMONIA in the last 168 hours. CBC:  Recent Labs Lab 03/15/15 2038 03/16/15 0407 03/17/15 0419  WBC 6.6 6.9 10.8*  NEUTROABS  --  6.4  --   HGB 13.9 13.6 13.2  HCT 42.4 43.0 41.5  MCV 76.7* 78.6 77.7*  PLT 190 231 231   Cardiac Enzymes:  Recent Labs Lab 03/16/15 0407 03/16/15 1000 03/16/15 1529  TROPONINI <0.03 <0.03 <0.03   BNP: BNP (last 3 results)  Recent Labs  12/13/14 0820 12/18/14 1846 03/19/15 0441  BNP 29.7 231.0* 61.9    ProBNP (last 3 results)  Recent Labs  04/17/14 0120 07/20/14 1817  PROBNP 44.7 39.7    CBG:  Recent Labs Lab 03/20/15 1115 03/20/15 1627 03/20/15 2107 03/21/15 0659 03/21/15 1120  GLUCAP 241* 281* 214* 124* 159*       Signed:  Jayveion Stalling A  [facility] 03/21/2015, 1:49 PM

## 2015-03-21 NOTE — Progress Notes (Signed)
[name], female   DOB: 11-Jun-1966, 49 y.o.   MRN: [id number]     Subjective:    SOB improving.   Objective:   Temp:  [97.3 F (36.3 C)-97.8 F (36.6 C)] 97.3 F (36.3 C) (06/26 0659) Pulse Rate:  [83-88] 88 (06/26 0659) Resp:  [20] 20 (06/26 0659) BP: (101-131)/(51-88) 126/74 mmHg (06/26 0659) SpO2:  [96 %-99 %] 96 % (06/26 0859) Weight:  [292 lb 15.2 oz (132.882 kg)] 292 lb 15.2 oz (132.882 kg) (06/26 0659) Last BM Date: 03/15/15  Filed Weights   03/19/15 0500 03/20/15 0505 03/21/15 0659  Weight: 295 lb 8 oz (134.038 kg) 293 lb 4.8 oz (133.04 kg) 292 lb 15.2 oz (132.882 kg)    Intake/Output Summary (Last 24 hours) at 03/21/15 0902 Last data filed at 03/21/15 0853  Gross per 24 hour  Intake   2261 ml  Output   4800 ml  Net  -2539 ml    Telemetry: NSR  Exam:  General: NAD  Resp: CTAB  Cardiac: RRR, no m/r/g, no JVD  GI: abdomen soft, NT, Nd  MSK: no LE edema  Neuro: no focal deficits    Lab Results:  Basic Metabolic Panel:  Recent Labs Lab 03/15/15 2044  03/18/15 0414 03/19/15 0441 03/20/15 0415 03/20/15 0940  NA 136  < > 133* 136 134*  --   K 3.3*  < > 4.0 4.0 3.1*  --   CL 95*  < > 93* 93* 88*  --   CO2 29  < > 31 31 35*  --   GLUCOSE 162*  < > 376* 357* 228*  --   BUN 8  < > 18 23* 26*  --   CREATININE 1.12*  < > 1.13* 0.97 1.20*  --   CALCIUM 8.7*  < > 9.5 9.6 8.4*  --   MG 1.4*  --   --   --   --  2.0  < > = values in this interval not displayed.  Liver Function Tests:  Recent Labs Lab 03/16/15 0407  AST 34  ALT 29  ALKPHOS 73  BILITOT 0.7  PROT 7.9  ALBUMIN 3.6    CBC:  Recent Labs Lab 03/15/15 2038 03/16/15 0407 03/17/15 0419  WBC 6.6 6.9 10.8*  HGB 13.9 13.6 13.2  HCT 42.4 43.0 41.5  MCV 76.7* 78.6 77.7*  PLT 190 231 231    Cardiac Enzymes:  Recent Labs Lab 03/16/15 0407 03/16/15 1000 03/16/15 1529  TROPONINI <0.03 <0.03 <0.03    BNP:  Recent Labs  04/17/14 0120  07/20/14 1817  PROBNP 44.7 39.7    Coagulation:  Recent Labs Lab 03/16/15 0407 03/17/15 0419  INR 1.13 1.12    ECG:   Medications:   Scheduled Medications: . ampicillin-sulbactam (UNASYN) IV  3 g Intravenous Q6H  . budesonide-formoterol  2 puff Inhalation BID  . fluticasone  2 spray Each Nare Daily  . guaiFENesin  1,200 mg Oral BID  . heparin  5,000 Units Subcutaneous 3 times per day  . insulin aspart  0-15 Units Subcutaneous TID WC  . insulin aspart  10 Units Subcutaneous TID WC  . insulin glargine  50 Units Subcutaneous BID  . ipratropium-albuterol  3 mL Nebulization TID  . nicotine  14 mg Transdermal Daily  . pantoprazole  40 mg Oral BID AC  . predniSONE  40 mg Oral Q breakfast  . sodium chloride  3 mL Intravenous Q12H  . sodium chloride  3  mL Intravenous Q12H  . spironolactone  25 mg Oral BID  . venlafaxine XR  37.5 mg Oral Q breakfast     Infusions:     PRN Medications:  sodium chloride, sodium chloride, acetaminophen, albuterol, ALPRAZolam, chlorpheniramine-HYDROcodone, HYDROcodone-acetaminophen, nitroGLYCERIN, ondansetron (ZOFRAN) IV, sodium chloride, sodium chloride     Assessment/Plan    1. Acute on chronic diastolic heart failure - echo 11/2014 LVEF 91-91%, grade I diastolic dysfunction, severe RV dysfunction, PASP 106 mmHg.  - negative 2.8 liters yesterday according to charting, negative 22liters since admission. She received torsemide 100mg  once yesterday. Cr trending has been trending up, BMET not back today. Hold diuretics today until BMET back. Would consider toresmide 40mg  bid as maintenrence dosing given dose responses thus far.  2. 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 - plans per CHF team are to diurese and then start the endothelin receptor antagonist macitentan,  awaiting insurance approval, case management notes reviewed.   F/u Cr from this morning, potential discharge pending results with close outpatient f/u. Toresmide dosing pending labs.   Zandra Abts MD

## 2015-03-22 ENCOUNTER — Emergency Department (HOSPITAL_COMMUNITY): Payer: Medicare Other

## 2015-03-22 ENCOUNTER — Telehealth: Payer: Self-pay | Admitting: Internal Medicine

## 2015-03-22 ENCOUNTER — Other Ambulatory Visit: Payer: Self-pay

## 2015-03-22 ENCOUNTER — Telehealth (HOSPITAL_COMMUNITY): Payer: Self-pay

## 2015-03-22 ENCOUNTER — Encounter (HOSPITAL_COMMUNITY): Payer: Self-pay

## 2015-03-22 ENCOUNTER — Emergency Department (HOSPITAL_COMMUNITY)
Admission: EM | Admit: 2015-03-22 | Discharge: 2015-03-22 | Disposition: A | Discharge status: Home / Self Care | Payer: Medicare Other | Attending: Emergency Medicine | Admitting: Emergency Medicine

## 2015-03-22 DIAGNOSIS — J441 Chronic obstructive pulmonary disease with (acute) exacerbation: Secondary | ICD-10-CM | POA: Insufficient documentation

## 2015-03-22 DIAGNOSIS — R079 Chest pain, unspecified: Secondary | ICD-10-CM | POA: Diagnosis not present

## 2015-03-22 DIAGNOSIS — Z8742 Personal history of other diseases of the female genital tract: Secondary | ICD-10-CM | POA: Insufficient documentation

## 2015-03-22 DIAGNOSIS — Z87891 Personal history of nicotine dependence: Secondary | ICD-10-CM | POA: Diagnosis not present

## 2015-03-22 DIAGNOSIS — G8929 Other chronic pain: Secondary | ICD-10-CM | POA: Insufficient documentation

## 2015-03-22 DIAGNOSIS — Z86010 Personal history of colonic polyps: Secondary | ICD-10-CM | POA: Diagnosis not present

## 2015-03-22 DIAGNOSIS — M797 Fibromyalgia: Secondary | ICD-10-CM | POA: Diagnosis not present

## 2015-03-22 DIAGNOSIS — I270 Primary pulmonary hypertension: Secondary | ICD-10-CM | POA: Diagnosis not present

## 2015-03-22 DIAGNOSIS — J45909 Unspecified asthma, uncomplicated: Secondary | ICD-10-CM | POA: Diagnosis not present

## 2015-03-22 DIAGNOSIS — G4733 Obstructive sleep apnea (adult) (pediatric): Secondary | ICD-10-CM | POA: Diagnosis not present

## 2015-03-22 DIAGNOSIS — Z7982 Long term (current) use of aspirin: Secondary | ICD-10-CM | POA: Insufficient documentation

## 2015-03-22 DIAGNOSIS — I272 Other secondary pulmonary hypertension: Secondary | ICD-10-CM | POA: Diagnosis not present

## 2015-03-22 DIAGNOSIS — F329 Major depressive disorder, single episode, unspecified: Secondary | ICD-10-CM | POA: Insufficient documentation

## 2015-03-22 DIAGNOSIS — R0602 Shortness of breath: Secondary | ICD-10-CM | POA: Diagnosis not present

## 2015-03-22 DIAGNOSIS — J9611 Chronic respiratory failure with hypoxia: Secondary | ICD-10-CM | POA: Diagnosis not present

## 2015-03-22 DIAGNOSIS — R110 Nausea: Secondary | ICD-10-CM | POA: Insufficient documentation

## 2015-03-22 DIAGNOSIS — K219 Gastro-esophageal reflux disease without esophagitis: Secondary | ICD-10-CM | POA: Diagnosis not present

## 2015-03-22 DIAGNOSIS — R0789 Other chest pain: Secondary | ICD-10-CM

## 2015-03-22 DIAGNOSIS — M549 Dorsalgia, unspecified: Secondary | ICD-10-CM | POA: Diagnosis not present

## 2015-03-22 DIAGNOSIS — Z79899 Other long term (current) drug therapy: Secondary | ICD-10-CM | POA: Insufficient documentation

## 2015-03-22 DIAGNOSIS — I5032 Chronic diastolic (congestive) heart failure: Secondary | ICD-10-CM | POA: Diagnosis not present

## 2015-03-22 DIAGNOSIS — F410 Panic disorder [episodic paroxysmal anxiety] without agoraphobia: Secondary | ICD-10-CM | POA: Diagnosis not present

## 2015-03-22 DIAGNOSIS — E662 Morbid (severe) obesity with alveolar hypoventilation: Secondary | ICD-10-CM | POA: Diagnosis not present

## 2015-03-22 DIAGNOSIS — Z862 Personal history of diseases of the blood and blood-forming organs and certain disorders involving the immune mechanism: Secondary | ICD-10-CM | POA: Insufficient documentation

## 2015-03-22 DIAGNOSIS — I10 Essential (primary) hypertension: Secondary | ICD-10-CM | POA: Insufficient documentation

## 2015-03-22 DIAGNOSIS — E119 Type 2 diabetes mellitus without complications: Secondary | ICD-10-CM | POA: Diagnosis not present

## 2015-03-22 DIAGNOSIS — E785 Hyperlipidemia, unspecified: Secondary | ICD-10-CM | POA: Diagnosis not present

## 2015-03-22 DIAGNOSIS — D860 Sarcoidosis of lung: Secondary | ICD-10-CM | POA: Diagnosis not present

## 2015-03-22 DIAGNOSIS — Z7951 Long term (current) use of inhaled steroids: Secondary | ICD-10-CM | POA: Diagnosis not present

## 2015-03-22 DIAGNOSIS — I509 Heart failure, unspecified: Secondary | ICD-10-CM | POA: Insufficient documentation

## 2015-03-22 DIAGNOSIS — Z794 Long term (current) use of insulin: Secondary | ICD-10-CM | POA: Insufficient documentation

## 2015-03-22 DIAGNOSIS — Z91040 Latex allergy status: Secondary | ICD-10-CM | POA: Diagnosis not present

## 2015-03-22 DIAGNOSIS — Z9981 Dependence on supplemental oxygen: Secondary | ICD-10-CM | POA: Insufficient documentation

## 2015-03-22 DIAGNOSIS — F419 Anxiety disorder, unspecified: Secondary | ICD-10-CM | POA: Diagnosis not present

## 2015-03-22 LAB — COMPREHENSIVE METABOLIC PANEL
ALT: 59 U/L — ABNORMAL HIGH (ref 14–54)
AST: 43 U/L — ABNORMAL HIGH (ref 15–41)
Albumin: 3.4 g/dL — ABNORMAL LOW (ref 3.5–5.0)
Alkaline Phosphatase: 115 U/L (ref 38–126)
Anion gap: 11 (ref 5–15)
BILIRUBIN TOTAL: 0.3 mg/dL (ref 0.3–1.2)
BUN: 19 mg/dL (ref 6–20)
CHLORIDE: 93 mmol/L — AB (ref 101–111)
CO2: 28 mmol/L (ref 22–32)
Calcium: 8.7 mg/dL — ABNORMAL LOW (ref 8.9–10.3)
Creatinine, Ser: 1.3 mg/dL — ABNORMAL HIGH (ref 0.44–1.00)
GFR calc Af Amer: 55 mL/min — ABNORMAL LOW (ref >60)
GFR, EST NON AFRICAN AMERICAN: 48 mL/min — AB (ref >60)
Glucose, Bld: 255 mg/dL — ABNORMAL HIGH (ref 65–99)
Potassium: 4.4 mmol/L (ref 3.5–5.1)
Sodium: 132 mmol/L — ABNORMAL LOW (ref 135–145)
Total Protein: 7 g/dL (ref 6.5–8.1)

## 2015-03-22 LAB — CBC
HEMATOCRIT: 42.7 % (ref 36.0–46.0)
Hemoglobin: 13.7 g/dL (ref 12.0–15.0)
MCH: 24.8 pg — ABNORMAL LOW (ref 26.0–34.0)
MCHC: 32.1 g/dL (ref 30.0–36.0)
MCV: 77.2 fL — ABNORMAL LOW (ref 78.0–100.0)
Platelets: 267 10*3/uL (ref 150–400)
RBC: 5.53 MIL/uL — AB (ref 3.87–5.11)
RDW: 16 % — AB (ref 11.5–15.5)
WBC: 13 10*3/uL — AB (ref 4.0–10.5)

## 2015-03-22 LAB — I-STAT TROPONIN, ED
TROPONIN I, POC: 0 ng/mL (ref 0.00–0.08)
TROPONIN I, POC: 0 ng/mL (ref 0.00–0.08)

## 2015-03-22 MED ORDER — PREDNISONE 20 MG PO TABS
60.0000 mg | ORAL_TABLET | Freq: Once | ORAL | Status: AC
  Administered 2015-03-22: 60 mg via ORAL
  Filled 2015-03-22: qty 3

## 2015-03-22 MED ORDER — IPRATROPIUM-ALBUTEROL 0.5-2.5 (3) MG/3ML IN SOLN
3.0000 mL | Freq: Once | RESPIRATORY_TRACT | Status: AC
  Administered 2015-03-22: 3 mL via RESPIRATORY_TRACT
  Filled 2015-03-22: qty 3

## 2015-03-22 MED ORDER — METOCLOPRAMIDE HCL 5 MG/ML IJ SOLN
10.0000 mg | Freq: Once | INTRAMUSCULAR | Status: AC
  Administered 2015-03-22: 10 mg via INTRAVENOUS
  Filled 2015-03-22: qty 2

## 2015-03-22 MED ORDER — FENTANYL CITRATE (PF) 100 MCG/2ML IJ SOLN
50.0000 ug | Freq: Once | INTRAMUSCULAR | Status: AC
  Administered 2015-03-22: 50 ug via INTRAVENOUS
  Filled 2015-03-22: qty 2

## 2015-03-22 MED ORDER — ONDANSETRON HCL 4 MG/2ML IJ SOLN
4.0000 mg | Freq: Once | INTRAMUSCULAR | Status: AC
  Administered 2015-03-22: 4 mg via INTRAVENOUS
  Filled 2015-03-22: qty 2

## 2015-03-22 NOTE — Patient Outreach (Signed)
[facility]) Care Management  03/22/2015  [name] 26-Mar-1966 [id number]   Request from Erenest Rasher, RN to assign SW, assigned [employer], LCSW.  [name]. [place] [place] Management [place] Assistant Phone: [phone] Fax: [phone]

## 2015-03-22 NOTE — ED Notes (Signed)
Pt. Presents with complaint of worsening CP/SOB starting around 1400 today. Pt. D/c from hospital yesterday, admitted for pulmonary HTN. Pt. On 4L O2 chronic with sats ranging between 88-95. Pt. Complaint of L sided Chest pressure intermittently. Pt. Given 1 Nitro and 324 ASA.

## 2015-03-22 NOTE — Telephone Encounter (Signed)
[facility] patient need a order for Physical,Therapy, occupational Therapy,. Phone # [phone]

## 2015-03-22 NOTE — ED Provider Notes (Signed)
CSN: [id number]     Arrival date & time 03/22/15  1724 History   First MD Initiated Contact with Patient 03/22/15 1726     Chief Complaint  Patient presents with  . Chest Pain     (Consider location/radiation/quality/duration/timing/severity/associated sxs/prior Treatment) Patient is a 49 y.o. female presenting with chest pain.  Chest Pain Pain location:  L chest Pain quality: pressure   Pain radiates to:  Does not radiate Pain radiates to the back: no   Pain severity:  Moderate Onset quality:  Gradual Duration:  3 hours Timing:  Constant Progression:  Waxing and waning Chronicity:  Recurrent Context: at rest   Context: no movement and not raising an arm   Relieved by:  Nothing Worsened by:  Movement Ineffective treatments:  Nitroglycerin and aspirin Associated symptoms: diaphoresis, fatigue, nausea and shortness of breath   Associated symptoms: no abdominal pain, no altered mental status, no anxiety, no back pain, no claudication, no cough, no dizziness, no dysphagia, no fever, no headache, no heartburn, no palpitations, no syncope, not vomiting and no weakness   Risk factors: diabetes mellitus, high cholesterol, hypertension, immobilization, obesity and smoking (ex-smoker)   Risk factors: not female and no prior DVT/PE     Past Medical History  Diagnosis Date  . Hypertension   . Hyperlipidemia   . Chronic headache   . Fibromyalgia     daily narcotics  . Anxiety     hx chronic BZ use, stopped 07/2010  . Anemia   . Pulmonary sarcoidosis     unimpressive CT chest 2011  . Colonic polyp   . GERD (gastroesophageal reflux disease)   . ALLERGIC RHINITIS   . Asthma   . CHF (congestive heart failure)     Diastolic with fluid overload, May, 2012, LVEF 60%  . Morbid obesity   . Depression   . Panic attacks   . Diabetes mellitus   . COPD (chronic obstructive pulmonary disease)     on home O2, moderate airflow obstruction, suspect d/t emphysema  . Obesity   . Elevated LFTs  09/2011  . Ovarian cyst   . Chronic back pain   . Sleep apnea      CPAP  . Fatty liver   . Esophagitis   . Gastritis   . Internal hemorrhoids   . Adenomatous colon polyp 12/03/09    [facility] in [place], [place]   Past Surgical History  Procedure Laterality Date  . Polypectomy  2011  . Lumbar microdiscectomy  07/06/2011    R L4-5, stern  . Back surgery    . Carpal tunnel release    . Steroid spinal injections    . Hysteroscopy w/d&c N/A 03/25/2013    Procedure: DILATATION AND CURETTAGE /HYSTEROSCOPY;  Surgeon: [name], MD;  Location: [facility];  Service: Gynecology;  Laterality: N/A;  . Dilation and curettage of uterus    . Cardiac catheterization N/A 03/17/2015    Procedure: Right Heart Cath;  Surgeon: Larey Dresser, MD;  Location: [facility];  Service: Cardiovascular;  Laterality: N/A;   Family History  Problem Relation Age of Onset  . Hypertension Mother   . Heart disease Mother   . Emphysema Father   . Hypertension Father   . Stomach cancer Father   . Heart disease Father   . Allergies Brother   . Stomach cancer Brother   . Congestive Heart Failure Brother   . Diabetes type II Brother   . Heart disease Sister   .  Diabetes type II Sister   . Heart attack Sister   . Stroke Sister   . Hypertension Sister   . Heart failure Sister     diastolic  . Hypertension Sister    History  Substance Use Topics  . Smoking status: Former Smoker -- 0.25 packs/day for 29 years    Types: Cigarettes    Quit date: 11/24/2014  . Smokeless tobacco: Never Used  . Alcohol Use: 0.0 oz/week    0 Standard drinks or equivalent per week     Comment: occasionally   OB History    No data available     Review of Systems  Constitutional: Positive for diaphoresis and fatigue. Negative for fever, chills and appetite change.  HENT: Negative for congestion, ear pain, facial swelling, mouth sores, sore throat and trouble swallowing.   Eyes: Negative for visual disturbance.   Respiratory: Positive for shortness of breath. Negative for cough and chest tightness.   Cardiovascular: Positive for chest pain. Negative for palpitations, claudication and syncope.  Gastrointestinal: Positive for nausea. Negative for heartburn, vomiting, abdominal pain, diarrhea and blood in stool.  Endocrine: Negative for cold intolerance and heat intolerance.  Genitourinary: Negative for frequency, decreased urine volume and difficulty urinating.  Musculoskeletal: Negative for back pain and neck stiffness.  Skin: Negative for rash.  Neurological: Negative for dizziness, weakness, light-headedness and headaches.  All other systems reviewed and are negative.     Allergies  Simvastatin; Sulfamethoxazole-trimethoprim; Zolpidem tartrate; Azithromycin; Ceftin; Doxycycline; Latex; Metformin and related; Metolazone; Metronidazole; Ciprofloxacin; and Tramadol  Home Medications   Prior to Admission medications   Medication Sig Start Date End Date Taking? Authorizing Provider  albuterol (PROAIR HFA) 108 (90 BASE) MCG/ACT inhaler Inhale 2 puffs into the lungs every 6 (six) hours as needed for wheezing or shortness of breath. 02/23/15  Yes Rowe Clack, MD  albuterol (PROVENTIL) (2.5 MG/3ML) 0.083% nebulizer solution Take 3 mLs (2.5 mg total) by nebulization every 6 (six) hours as needed for wheezing or shortness of breath. 12/08/14  Yes Rowe Clack, MD  ALPRAZolam Duanne Moron) 1 MG tablet Take 1 tablet (1 mg total) by mouth 3 (three) times daily. 01/25/15  Yes Rowe Clack, MD  aspirin EC 81 MG tablet Take 81 mg by mouth daily.   Yes Historical Provider, MD  BIOTIN PO Take 1 capsule by mouth daily.   Yes Historical Provider, MD  budesonide-formoterol (SYMBICORT) 160-4.5 MCG/ACT inhaler Inhale 2 puffs into the lungs 2 (two) times daily.   Yes Historical Provider, MD  cetirizine (ZYRTEC) 1 MG/ML syrup Take 10 mLs (10 mg total) by mouth at bedtime. 01/06/15  Yes Rowe Clack, MD   diclofenac sodium (VOLTAREN) 1 % GEL Apply 4 g topically 4 (four) times daily. Patient taking differently: Apply 4 g topically 4 (four) times daily as needed (arthritis pain).  12/18/14  Yes Debbe Odea, MD  EMBEDA 20-0.8 MG CPCR Take 1 tablet by mouth daily. 03/09/15  Yes Historical Provider, MD  esomeprazole (NEXIUM) 40 MG capsule Take 40 mg by mouth 2 (two) times daily before a meal.   Yes Historical Provider, MD  fluticasone (FLONASE) 50 MCG/ACT nasal spray Place 2 sprays into both nostrils daily. Patient taking differently: Place 2 sprays into both nostrils daily as needed for allergies or rhinitis.  10/19/14  Yes Rowe Clack, MD  gabapentin (NEURONTIN) 300 MG capsule Take 300 mg by mouth daily as needed (neuropathy pain).    Yes Historical Provider, MD  glimepiride (AMARYL) 1  MG tablet Take 1 mg by mouth daily with breakfast.   Yes Historical Provider, MD  hydrOXYzine (ATARAX/VISTARIL) 10 MG tablet TAKE 1 TABLET BY MOUTH THREE TIMES DAILY AS NEEDED FOR ITCHING 08/27/14  Yes Biagio Borg, MD  insulin aspart (NOVOLOG FLEXPEN) 100 UNIT/ML FlexPen Inject 5 Units into the skin 3 (three) times daily with meals. 12/28/14  Yes Shanker Kristeen Mans, MD  Insulin Glargine (LANTUS SOLOSTAR) 100 UNIT/ML Solostar Pen Inject 40 Units into the skin 2 (two) times daily. 01/13/15  Yes Rowe Clack, MD  Multiple Vitamin (MULTIVITAMIN) capsule Take 1 capsule by mouth once a week.    Yes Historical Provider, MD  omega-3 fish oil (MAXEPA) 1000 MG CAPS capsule Take 1 capsule by mouth daily.   Yes Historical Provider, MD  potassium chloride SA (K-DUR,KLOR-CON) 20 MEQ tablet Take 2 tablets (40 mEq total) by mouth 2 (two) times daily. 03/21/15  Yes Verlee Monte, MD  predniSONE (STERAPRED UNI-PAK 21 TAB) 10 MG (21) TBPK tablet Take 4 tabs orally by mouth for 2 days, then take 3 tabs orally by mouth for 2 days, then take 2 tabs orally by mouth for 2 days, then take 1 tabs orally by mouth for 2 days, then stop. 03/21/15   Yes Verlee Monte, MD  promethazine (PHENERGAN) 25 MG tablet Take 1 tablet (25 mg total) by mouth every 6 (six) hours as needed for nausea or vomiting. for nausea 01/11/15  Yes Rowe Clack, MD  SPIRIVA HANDIHALER 18 MCG inhalation capsule INHALE THE CONTENTS OF 1 CAPSULE VIA INHALATION DEVICE EVERY DAY 01/14/15  Yes Kathee Delton, MD  spironolactone (ALDACTONE) 25 MG tablet Take 25 mg by mouth 2 (two) times daily.   Yes Historical Provider, MD  tiZANidine (ZANAFLEX) 2 MG tablet Take 2 mg by mouth 2 (two) times daily as needed for muscle spasms.  03/10/15  Yes Historical Provider, MD  torsemide (DEMADEX) 20 MG tablet Take 2 tablets (40 mg total) by mouth 2 (two) times daily. 03/21/15  Yes Verlee Monte, MD  varenicline (CHANTIX) 0.5 MG tablet Take 1 tablet (0.5 mg total) by mouth 2 (two) times daily. 02/08/15  Yes Rowe Clack, MD  venlafaxine XR (EFFEXOR XR) 37.5 MG 24 hr capsule Take 1 capsule (37.5 mg total) by mouth daily with breakfast. 11/13/14  Yes Rowe Clack, MD  fluconazole (DIFLUCAN) 150 MG tablet Take 1 tablet (150 mg total) by mouth once. Patient not taking: Reported on 03/22/2015 01/25/15   Rowe Clack, MD  HYDROcodone-acetaminophen (NORCO) 5-325 MG per tablet Take 1-2 tablets by mouth every 6 (six) hours as needed for moderate pain. Patient not taking: Reported on 03/15/2015 02/27/15   Tatyana Kirichenko, PA-C  nystatin (MYCOSTATIN) 100000 UNITS/ML SUSP Take 1 mL by mouth 3 (three) times daily. Swish solution then swallow. Patient not taking: Reported on 03/15/2015 01/04/15   Rowe Clack, MD   BP 116/62 mmHg  Pulse 73  Temp(Src) 98.1 F (36.7 C) (Oral)  Resp 16  SpO2 98%  LMP 10/16/2012 Physical Exam  Constitutional: She is oriented to person, place, and time. She appears well-developed and well-nourished. No distress.  HENT:  Head: Normocephalic and atraumatic.  Right Ear: External ear normal.  Left Ear: External ear normal.  Nose: Nose normal.  Eyes:  Conjunctivae and EOM are normal. Pupils are equal, round, and reactive to light. Right eye exhibits no discharge. Left eye exhibits no discharge. No scleral icterus.  Neck: Normal range of motion. Neck supple.  Cardiovascular: Normal rate, regular rhythm and normal heart sounds.  Exam reveals no gallop and no friction rub.   No murmur heard. Pulmonary/Chest: Effort normal. No stridor. No respiratory distress. She has decreased breath sounds in the left upper field, the left middle field and the left lower field. She has wheezes in the left upper field, the left middle field and the left lower field. She has no rales.  Abdominal: Soft. She exhibits no distension. There is no tenderness.  Musculoskeletal: She exhibits no edema or tenderness.  Neurological: She is alert and oriented to person, place, and time.  Skin: Skin is warm and dry. No rash noted. She is not diaphoretic. No erythema.  Psychiatric: She has a normal mood and affect.    ED Course  Procedures (including critical care time) Labs Review Labs Reviewed  CBC - Abnormal; Notable for the following:    WBC 13.0 (*)    RBC 5.53 (*)    MCV 77.2 (*)    MCH 24.8 (*)    RDW 16.0 (*)    All other components within normal limits  COMPREHENSIVE METABOLIC PANEL - Abnormal; Notable for the following:    Sodium 132 (*)    Chloride 93 (*)    Glucose, Bld 255 (*)    Creatinine, Ser 1.30 (*)    Calcium 8.7 (*)    Albumin 3.4 (*)    AST 43 (*)    ALT 59 (*)    GFR calc non Af Amer 48 (*)    GFR calc Af Amer 55 (*)    All other components within normal limits  I-STAT TROPOININ, ED  Randolm Idol, ED    Imaging Review Dg Chest 2 View  03/22/2015   CLINICAL DATA:  Left-sided chest pain. Hypertension. History of sarcoidosis.  EXAM: CHEST  2 VIEW  COMPARISON:  CT chest 03/16/2015. PA and lateral chest 10/18/2014. Single view of the chest 03/15/2015.  FINDINGS: The lungs are emphysematous with unchanged patchy basilar opacities likely  due to scar or atelectasis. No consolidative process, pneumothorax or effusion is identified. Heart size is enlarged.  IMPRESSION: Cardiomegaly without acute disease.  Emphysema. Basilar scarring compatible sarcoidosis appears unchanged.   Electronically Signed   By: Inge Rise M.D.   On: 03/22/2015 18:55     EKG Interpretation   Date/Time:  Monday March 22 2015 17:27:46 EDT Ventricular Rate:  84 PR Interval:  141 QRS Duration: 90 QT Interval:  390 QTC Calculation: 461 R Axis:   32 Text Interpretation:  Ectopic atrial rhythm ST elev, probable normal early  repol pattern No significant change was found Confirmed by Wyvonnia Dusky  MD,  STEPHEN [phone]) on 03/22/2015 6:10:57 PM      MDM   49 year old female with an extensive past medical history listed above who presents with recurrence of her left chest pain for which she's been recently worked up. Patient was admitted last week and obtain a right heart cath revealing new onset of pulmonary hypertension. She had recurrence of her chest pain following the cath while admitted. Pain resolved and recurred today. Patient also endorses shortness of breath. Rest of the history and exam as above. Exam revealed likely COPD exacerbation. Patient given steroids and DuoNeb with significant improvement in air entry. EKG with atrial ectopy otherwise unremarkable. Initial troponin negative. Chest x-ray with no evidence of pneumonia, pneumothorax, pneumomediastinum, widening or abnormal mediastinal contour, pulmonary edema or effusions. It did show her known emphysema and sarcoidosis that is unchanged. Cardiology consultation and  failure this is noncardiac. Total troponin was obtained and negative. Patient had improvement of her symptoms following the breathing treatment. There is low clinical suspicion for pulmonary embolism or dissection at this time. She is in good condition and stable for discharge with strict return precautions. Patient is to follow-up with  her primary care provider.  She was seen in conjunction with Dr. Wyvonnia Dusky.   Final diagnoses:  Left sided chest pain  COPD exacerbation  Nausea        Addison Lank, MD 03/23/15 0071  Ezequiel Essex, MD 03/23/15 2197

## 2015-03-22 NOTE — ED Notes (Addendum)
BEDSIDE HANDOFF COMPLETED WITH ONGOING NURSE, ANNA

## 2015-03-22 NOTE — Telephone Encounter (Signed)
Patient called c/o 8/10 CP, generalized across chest, moreso on left side.  Nothing makes it better or worse.  Has been going on x a few hrs.  ADvised to call 911 and have EMS in route to ED.  Will notify CHF providers of patient anticipated arrival to [facility] ED.  Renee Pain

## 2015-03-22 NOTE — Consult Note (Signed)
Admit date: 03/22/2015 Referring Physician  Dr. Wyvonnia Dusky Primary Physician Gwendolyn Grant, MD Primary Cardiologist  Dr. Aundra Dubin Reason for Consultation  Chest pain  HPI: 49 year old female with morbid obesity, recent hospitalization for acute on chronic diastolic heart failure in combination with pulmonary hypertension with right heart catheterization showing a mean peel pulmonary artery pressure of 71, wedge of 20 determining mixed etiology including OSA, COPD, diastolic dysfunction who called the office earlier today with 3 hours of constant chest pain that did not seem to be alleviated with any positional change or deep breath. She has had some chronic nausea and perhaps GERD associated symptoms. She takes Nexium for this. Here in the emergency room, point-of-care troponin is normal. She still continues to smoke. She has been treated with narcotics in the past for fibromyalgia. EKG as described below.  She received a nebulizer. She overall feels better. No further chest discomfort.  She is hopeful that she will be able to start pulmonary hypertension medication Opsumit when it is approved.    PMH:   Past Medical History  Diagnosis Date  . Hypertension   . Hyperlipidemia   . Chronic headache   . Fibromyalgia     daily narcotics  . Anxiety     hx chronic BZ use, stopped 07/2010  . Anemia   . Pulmonary sarcoidosis     unimpressive CT chest 2011  . Colonic polyp   . GERD (gastroesophageal reflux disease)   . ALLERGIC RHINITIS   . Asthma   . CHF (congestive heart failure)     Diastolic with fluid overload, May, 2012, LVEF 60%  . Morbid obesity   . Depression   . Panic attacks   . Diabetes mellitus   . COPD (chronic obstructive pulmonary disease)     on home O2, moderate airflow obstruction, suspect d/t emphysema  . Obesity   . Elevated LFTs 09/2011  . Ovarian cyst   . Chronic back pain   . Sleep apnea      CPAP  . Fatty liver   . Esophagitis   . Gastritis   .  Internal hemorrhoids   . Adenomatous colon polyp 12/03/09    [facility] in [place], [place]    PSH:   Past Surgical History  Procedure Laterality Date  . Polypectomy  2011  . Lumbar microdiscectomy  07/06/2011    R L4-5, stern  . Back surgery    . Carpal tunnel release    . Steroid spinal injections    . Hysteroscopy w/d&c N/A 03/25/2013    Procedure: DILATATION AND CURETTAGE /HYSTEROSCOPY;  Surgeon: [name], MD;  Location: [facility];  Service: Gynecology;  Laterality: N/A;  . Dilation and curettage of uterus    . Cardiac catheterization N/A 03/17/2015    Procedure: Right Heart Cath;  Surgeon: Larey Dresser, MD;  Location: [facility];  Service: Cardiovascular;  Laterality: N/A;   Allergies:  Simvastatin; Sulfamethoxazole-trimethoprim; Zolpidem tartrate; Azithromycin; Ceftin; Doxycycline; Latex; Metformin and related; Metolazone; Metronidazole; Ciprofloxacin; and Tramadol Prior to Admit Meds:   Prior to Admission medications   Medication Sig Start Date End Date Taking? Authorizing Provider  albuterol (PROAIR HFA) 108 (90 BASE) MCG/ACT inhaler Inhale 2 puffs into the lungs every 6 (six) hours as needed for wheezing or shortness of breath. 02/23/15  Yes Rowe Clack, MD  albuterol (PROVENTIL) (2.5 MG/3ML) 0.083% nebulizer solution Take 3 mLs (2.5 mg total) by nebulization every 6 (six) hours as needed for wheezing or shortness of  breath. 12/08/14  Yes Rowe Clack, MD  ALPRAZolam Duanne Moron) 1 MG tablet Take 1 tablet (1 mg total) by mouth 3 (three) times daily. 01/25/15  Yes Rowe Clack, MD  aspirin EC 81 MG tablet Take 81 mg by mouth daily.   Yes Historical Provider, MD  BIOTIN PO Take 1 capsule by mouth daily.   Yes Historical Provider, MD  budesonide-formoterol (SYMBICORT) 160-4.5 MCG/ACT inhaler Inhale 2 puffs into the lungs 2 (two) times daily.   Yes Historical Provider, MD  cetirizine (ZYRTEC) 1 MG/ML syrup Take 10 mLs (10 mg total) by mouth at bedtime. 01/06/15   Yes Rowe Clack, MD  diclofenac sodium (VOLTAREN) 1 % GEL Apply 4 g topically 4 (four) times daily. Patient taking differently: Apply 4 g topically 4 (four) times daily as needed (arthritis pain).  12/18/14  Yes Debbe Odea, MD  EMBEDA 20-0.8 MG CPCR Take 1 tablet by mouth daily. 03/09/15  Yes Historical Provider, MD  esomeprazole (NEXIUM) 40 MG capsule Take 40 mg by mouth 2 (two) times daily before a meal.   Yes Historical Provider, MD  fluticasone (FLONASE) 50 MCG/ACT nasal spray Place 2 sprays into both nostrils daily. Patient taking differently: Place 2 sprays into both nostrils daily as needed for allergies or rhinitis.  10/19/14  Yes Rowe Clack, MD  gabapentin (NEURONTIN) 300 MG capsule Take 300 mg by mouth daily as needed (neuropathy pain).    Yes Historical Provider, MD  glimepiride (AMARYL) 1 MG tablet Take 1 mg by mouth daily with breakfast.   Yes Historical Provider, MD  hydrOXYzine (ATARAX/VISTARIL) 10 MG tablet TAKE 1 TABLET BY MOUTH THREE TIMES DAILY AS NEEDED FOR ITCHING 08/27/14  Yes Biagio Borg, MD  insulin aspart (NOVOLOG FLEXPEN) 100 UNIT/ML FlexPen Inject 5 Units into the skin 3 (three) times daily with meals. 12/28/14  Yes Shanker Kristeen Mans, MD  Insulin Glargine (LANTUS SOLOSTAR) 100 UNIT/ML Solostar Pen Inject 40 Units into the skin 2 (two) times daily. 01/13/15  Yes Rowe Clack, MD  Multiple Vitamin (MULTIVITAMIN) capsule Take 1 capsule by mouth once a week.    Yes Historical Provider, MD  omega-3 fish oil (MAXEPA) 1000 MG CAPS capsule Take 1 capsule by mouth daily.   Yes Historical Provider, MD  potassium chloride SA (K-DUR,KLOR-CON) 20 MEQ tablet Take 2 tablets (40 mEq total) by mouth 2 (two) times daily. 03/21/15  Yes Verlee Monte, MD  predniSONE (STERAPRED UNI-PAK 21 TAB) 10 MG (21) TBPK tablet Take 4 tabs orally by mouth for 2 days, then take 3 tabs orally by mouth for 2 days, then take 2 tabs orally by mouth for 2 days, then take 1 tabs orally by mouth for  2 days, then stop. 03/21/15  Yes Verlee Monte, MD  promethazine (PHENERGAN) 25 MG tablet Take 1 tablet (25 mg total) by mouth every 6 (six) hours as needed for nausea or vomiting. for nausea 01/11/15  Yes Rowe Clack, MD  SPIRIVA HANDIHALER 18 MCG inhalation capsule INHALE THE CONTENTS OF 1 CAPSULE VIA INHALATION DEVICE EVERY DAY 01/14/15  Yes Kathee Delton, MD  spironolactone (ALDACTONE) 25 MG tablet Take 25 mg by mouth 2 (two) times daily.   Yes Historical Provider, MD  tiZANidine (ZANAFLEX) 2 MG tablet Take 2 mg by mouth 2 (two) times daily as needed for muscle spasms.  03/10/15  Yes Historical Provider, MD  torsemide (DEMADEX) 20 MG tablet Take 2 tablets (40 mg total) by mouth 2 (two) times daily. 03/21/15  Yes Verlee Monte, MD  varenicline (CHANTIX) 0.5 MG tablet Take 1 tablet (0.5 mg total) by mouth 2 (two) times daily. 02/08/15  Yes Rowe Clack, MD  venlafaxine XR (EFFEXOR XR) 37.5 MG 24 hr capsule Take 1 capsule (37.5 mg total) by mouth daily with breakfast. 11/13/14  Yes Rowe Clack, MD  fluconazole (DIFLUCAN) 150 MG tablet Take 1 tablet (150 mg total) by mouth once. Patient not taking: Reported on 03/22/2015 01/25/15   Rowe Clack, MD  HYDROcodone-acetaminophen (NORCO) 5-325 MG per tablet Take 1-2 tablets by mouth every 6 (six) hours as needed for moderate pain. Patient not taking: Reported on 03/15/2015 02/27/15   Tatyana Kirichenko, PA-C  nystatin (MYCOSTATIN) 100000 UNITS/ML SUSP Take 1 mL by mouth 3 (three) times daily. Swish solution then swallow. Patient not taking: Reported on 03/15/2015 01/04/15   Rowe Clack, MD   Fam HX:    Family History  Problem Relation Age of Onset  . Hypertension Mother   . Heart disease Mother   . Emphysema Father   . Hypertension Father   . Stomach cancer Father   . Heart disease Father   . Allergies Brother   . Stomach cancer Brother   . Congestive Heart Failure Brother   . Diabetes type II Brother   . Heart disease Sister    . Diabetes type II Sister   . Heart attack Sister   . Stroke Sister   . Hypertension Sister   . Heart failure Sister     diastolic  . Hypertension Sister    Social HX:    History   Social History  . Marital Status: Legally Separated    Spouse Name: N/A  . Number of Children: N/A  . Years of Education: N/A   Occupational History  . Not on file.   Social History Main Topics  . Smoking status: Former Smoker -- 0.25 packs/day for 29 years    Types: Cigarettes    Quit date: 11/24/2014  . Smokeless tobacco: Never Used  . Alcohol Use: 0.0 oz/week    0 Standard drinks or equivalent per week     Comment: occasionally  . Drug Use: Yes    Special: Cocaine, Marijuana     Comment: remote cocaine; current marijuana  . Sexual Activity: Not on file   Other Topics Concern  . Not on file   Social History Narrative   Pt is single. No children. On disability     ROS:  All 11 ROS were addressed and are negative except what is stated in the HPI   Physical Exam: Blood pressure 119/53, pulse 78, temperature 98.1 F (36.7 C), temperature source Oral, resp. rate 15, last menstrual period 10/16/2012, SpO2 97 %.   General: Well developed, well nourished, in no acute distress Head: Eyes PERRLA, No xanthomas.   Normal cephalic and atramatic  Lungs:   Clear bilaterally to auscultation and percussion. Normal respiratory effort. No wheezes, no rales. Heart:   HRRR S1 S2 Pulses are 2+ & equal. No murmur, rubs, gallops.  No carotid bruit. No JVD.  No abdominal bruits.  Abdomen: Bowel sounds are positive, abdomen soft and non-tender without masses. No hepatosplenomegaly. Msk:  Back normal. Normal strength and tone for age. Extremities:  No clubbing, cyanosis or edema.  DP +1 Neuro: Alert and oriented X 3, non-focal, MAE x 4 GU: Deferred Rectal: Deferred Psych:  Good affect, responds appropriately      Labs: Lab Results  Component Value Date  WBC 13.0* 03/22/2015   HGB 13.7 03/22/2015    HCT 42.7 03/22/2015   MCV 77.2* 03/22/2015   PLT 267 03/22/2015     Recent Labs Lab 03/22/15 1748  NA 132*  K 4.4  CL 93*  CO2 28  BUN 19  CREATININE 1.30*  CALCIUM 8.7*  PROT 7.0  BILITOT 0.3  ALKPHOS 115  ALT 59*  AST 43*  GLUCOSE 255*   No results for input(s): CKTOTAL, CKMB, TROPONINI in the last 72 hours. Lab Results  Component Value Date   CHOL 262* 04/06/2014   HDL 38.00* 04/06/2014   LDLCALC 178* 04/06/2014   TRIG 229.0* 04/06/2014   Lab Results  Component Value Date   DDIMER 1.78* 03/15/2015     Radiology:  Dg Chest 2 View  03/22/2015   CLINICAL DATA:  Left-sided chest pain. Hypertension. History of sarcoidosis.  EXAM: CHEST  2 VIEW  COMPARISON:  CT chest 03/16/2015. PA and lateral chest 10/18/2014. Single view of the chest 03/15/2015.  FINDINGS: The lungs are emphysematous with unchanged patchy basilar opacities likely due to scar or atelectasis. No consolidative process, pneumothorax or effusion is identified. Heart size is enlarged.  IMPRESSION: Cardiomegaly without acute disease.  Emphysema. Basilar scarring compatible sarcoidosis appears unchanged.   Electronically Signed   By: Inge Rise M.D.   On: 03/22/2015 18:55   Personally viewed.  EKG:  Ectopic atrial rhythm noted when compared to prior EKG, subtle ST segment changes inferior leads. Personally viewed.   ASSESSMENT/PLAN:    49 year old female with morbid obesity, obesity hypoventilation syndrome, COPD, chronic smoker, uncontrolled diabetes, multiple medical problems, GERD, possible gastroparesis with 3 hours of chest discomfort, normal troponin, just discharged from the hospital yesterday.  -Troponin normal, reassuring. No significant ST segment changes on EKG. She is feeling better. She does have some chronic issues with nausea and perhaps some of her chest discomfort was associated with gastric reflux. Given her low risk troponin, I feel comfortable allowing her to be discharged from the  emergency department with close follow-up. She has an appointment coming up soon in July in heart failure clinic. Ectopic atrial rhythm noted on EKG. Nebulizer given, wheezes have improved. Continuing with torsemide. Creatinine relatively stable. She was comfortable with discharge.    Candee Furbish, MD  03/22/2015  8:11 PM

## 2015-03-22 NOTE — Patient Outreach (Addendum)
Initial transition of care completed.  Patient was discharged from [facility] on yesterday. Patient states she is awaiting visit from home health nurse who is scheduled for today.  Patient responded to Depression Screening which indicated need for further psycosocial support.   Referral for LCSW made.   Referral as follows: LCSW referral to follow up with depression screening.  Patient indicates feelings of depression, sometimes feeling like life would be better without her.  Patient denies feeling this way now, does not feel homicidal.  Patient and this RNCM discussed use of 911 system for emergencies and feeling she cannot cope.

## 2015-03-22 NOTE — ED Notes (Signed)
Patient transported to X-ray 

## 2015-03-22 NOTE — Discharge Instructions (Signed)
Chest Pain (Nonspecific) °It is often hard to give a specific diagnosis for the cause of chest pain. There is always a chance that your pain could be related to something serious, such as a heart attack or a blood clot in the lungs. You need to follow up with your health care provider for further evaluation. °CAUSES  °· Heartburn. °· Pneumonia or bronchitis. °· Anxiety or stress. °· Inflammation around your heart (pericarditis) or lung (pleuritis or pleurisy). °· A blood clot in the lung. °· A collapsed lung (pneumothorax). It can develop suddenly on its own (spontaneous pneumothorax) or from trauma to the chest. °· Shingles infection (herpes zoster virus). °The chest wall is composed of bones, muscles, and cartilage. Any of these can be the source of the pain. °· The bones can be bruised by injury. °· The muscles or cartilage can be strained by coughing or overwork. °· The cartilage can be affected by inflammation and become sore (costochondritis). °DIAGNOSIS  °Lab tests or other studies may be needed to find the cause of your pain. Your health care provider may have you take a test called an ambulatory electrocardiogram (ECG). An ECG records your heartbeat patterns over a 24-hour period. You may also have other tests, such as: °· Transthoracic echocardiogram (TTE). During echocardiography, sound waves are used to evaluate how blood flows through your heart. °· Transesophageal echocardiogram (TEE). °· Cardiac monitoring. This allows your health care provider to monitor your heart rate and rhythm in real time. °· Holter monitor. This is a portable device that records your heartbeat and can help diagnose heart arrhythmias. It allows your health care provider to track your heart activity for several days, if needed. °· Stress tests by exercise or by giving medicine that makes the heart beat faster. °TREATMENT  °· Treatment depends on what may be causing your chest pain. Treatment may include: °¨ Acid blockers for  heartburn. °¨ Anti-inflammatory medicine. °¨ Pain medicine for inflammatory conditions. °¨ Antibiotics if an infection is present. °· You may be advised to change lifestyle habits. This includes stopping smoking and avoiding alcohol, caffeine, and chocolate. °· You may be advised to keep your head raised (elevated) when sleeping. This reduces the chance of acid going backward from your stomach into your esophagus. °Most of the time, nonspecific chest pain will improve within 2-3 days with rest and mild pain medicine.  °HOME CARE INSTRUCTIONS  °· If antibiotics were prescribed, take them as directed. Finish them even if you start to feel better. °· For the next few days, avoid physical activities that bring on chest pain. Continue physical activities as directed. °· Do not use any tobacco products, including cigarettes, chewing tobacco, or electronic cigarettes. °· Avoid drinking alcohol. °· Only take medicine as directed by your health care provider. °· Follow your health care provider's suggestions for further testing if your chest pain does not go away. °· Keep any follow-up appointments you made. If you do not go to an appointment, you could develop lasting (chronic) problems with pain. If there is any problem keeping an appointment, call to reschedule. °SEEK MEDICAL CARE IF:  °· Your chest pain does not go away, even after treatment. °· You have a rash with blisters on your chest. °· You have a fever. °SEEK IMMEDIATE MEDICAL CARE IF:  °· You have increased chest pain or pain that spreads to your arm, neck, jaw, back, or abdomen. °· You have shortness of breath. °· You have an increasing cough, or you cough   up blood.  You have severe back or abdominal pain.  You feel nauseous or vomit.  You have severe weakness.  You faint.  You have chills. This is an emergency. Do not wait to see if the pain will go away. Get medical help at once. Call your local emergency services (911 in [place] Do not drive  yourself to the hospital. MAKE SURE YOU:   Understand these instructions.  Will watch your condition.  Will get help right away if you are not doing well or get worse. Document Released: 06/21/2005 Document Revised: 09/16/2013 Document Reviewed: 04/16/2008 [facility] Patient Information 2015 [place], [place]. This information is not intended to replace advice given to you by your health care provider. Make sure you discuss any questions you have with your health care provider. Chronic Obstructive Pulmonary Disease Chronic obstructive pulmonary disease (COPD) is a common lung condition in which airflow from the lungs is limited. COPD is a general term that can be used to describe many different lung problems that limit airflow, including both chronic bronchitis and emphysema. If you have COPD, your lung function will probably never return to normal, but there are measures you can take to improve lung function and make yourself feel better.  CAUSES   Smoking (common).   Exposure to secondhand smoke.   Genetic problems.  Chronic inflammatory lung diseases or recurrent infections. SYMPTOMS   Shortness of breath, especially with physical activity.   Deep, persistent (chronic) cough with a large amount of thick mucus.   Wheezing.   Rapid breaths (tachypnea).   Gray or bluish discoloration (cyanosis) of the skin, especially in fingers, toes, or lips.   Fatigue.   Weight loss.   Frequent infections or episodes when breathing symptoms become much worse (exacerbations).   Chest tightness. DIAGNOSIS  Your health care provider will take a medical history and perform a physical examination to make the initial diagnosis. Additional tests for COPD may include:   Lung (pulmonary) function tests.  Chest X-ray.  CT scan.  Blood tests. TREATMENT  Treatment available to help you feel better when you have COPD includes:   Inhaler and nebulizer medicines. These help manage the  symptoms of COPD and make your breathing more comfortable.  Supplemental oxygen. Supplemental oxygen is only helpful if you have a low oxygen level in your blood.   Exercise and physical activity. These are beneficial for nearly all people with COPD. Some people may also benefit from a pulmonary rehabilitation program. HOME CARE INSTRUCTIONS   Take all medicines (inhaled or pills) as directed by your health care provider.  Avoid over-the-counter medicines or cough syrups that dry up your airway (such as antihistamines) and slow down the elimination of secretions unless instructed otherwise by your health care provider.   If you are a smoker, the most important thing that you can do is stop smoking. Continuing to smoke will cause further lung damage and breathing trouble. Ask your health care provider for help with quitting smoking. He or she can direct you to community resources or hospitals that provide support.  Avoid exposure to irritants such as smoke, chemicals, and fumes that aggravate your breathing.  Use oxygen therapy and pulmonary rehabilitation if directed by your health care provider. If you require home oxygen therapy, ask your health care provider whether you should purchase a pulse oximeter to measure your oxygen level at home.   Avoid contact with individuals who have a contagious illness.  Avoid extreme temperature and humidity changes.  Eat  healthy foods. Eating smaller, more frequent meals and resting before meals may help you maintain your strength.  Stay active, but balance activity with periods of rest. Exercise and physical activity will help you maintain your ability to do things you want to do.  Preventing infection and hospitalization is very important when you have COPD. Make sure to receive all the vaccines your health care provider recommends, especially the pneumococcal and influenza vaccines. Ask your health care provider whether you need a pneumonia  vaccine.  Learn and use relaxation techniques to manage stress.  Learn and use controlled breathing techniques as directed by your health care provider. Controlled breathing techniques include:   Pursed lip breathing. Start by breathing in (inhaling) through your nose for 1 second. Then, purse your lips as if you were going to whistle and breathe out (exhale) through the pursed lips for 2 seconds.   Diaphragmatic breathing. Start by putting one hand on your abdomen just above your waist. Inhale slowly through your nose. The hand on your abdomen should move out. Then purse your lips and exhale slowly. You should be able to feel the hand on your abdomen moving in as you exhale.   Learn and use controlled coughing to clear mucus from your lungs. Controlled coughing is a series of short, progressive coughs. The steps of controlled coughing are:   Lean your head slightly forward.   Breathe in deeply using diaphragmatic breathing.   Try to hold your breath for 3 seconds.   Keep your mouth slightly open while coughing twice.   Spit any mucus out into a tissue.   Rest and repeat the steps once or twice as needed. SEEK MEDICAL CARE IF:   You are coughing up more mucus than usual.   There is a change in the color or thickness of your mucus.   Your breathing is more labored than usual.   Your breathing is faster than usual.  SEEK IMMEDIATE MEDICAL CARE IF:   You have shortness of breath while you are resting.   You have shortness of breath that prevents you from:  Being able to talk.   Performing your usual physical activities.   You have chest pain lasting longer than 5 minutes.   Your skin color is more cyanotic than usual.  You measure low oxygen saturations for longer than 5 minutes with a pulse oximeter. MAKE SURE YOU:   Understand these instructions.  Will watch your condition.  Will get help right away if you are not doing well or get worse. Document  Released: 06/21/2005 Document Revised: 01/26/2014 Document Reviewed: 05/08/2013 [facility] Patient Information 2015 [place], [place]. This information is not intended to replace advice given to you by your health care provider. Make sure you discuss any questions you have with your health care provider.

## 2015-03-23 DIAGNOSIS — M488X6 Other specified spondylopathies, lumbar region: Secondary | ICD-10-CM | POA: Diagnosis not present

## 2015-03-23 DIAGNOSIS — M47817 Spondylosis without myelopathy or radiculopathy, lumbosacral region: Secondary | ICD-10-CM | POA: Diagnosis not present

## 2015-03-23 DIAGNOSIS — G894 Chronic pain syndrome: Secondary | ICD-10-CM | POA: Diagnosis not present

## 2015-03-23 DIAGNOSIS — M545 Low back pain: Secondary | ICD-10-CM | POA: Diagnosis not present

## 2015-03-23 DIAGNOSIS — M79606 Pain in leg, unspecified: Secondary | ICD-10-CM | POA: Diagnosis not present

## 2015-03-23 DIAGNOSIS — Z79899 Other long term (current) drug therapy: Secondary | ICD-10-CM | POA: Diagnosis not present

## 2015-03-23 NOTE — Telephone Encounter (Signed)
Pt has a referral to [facility].   Okay to order pt and ot.

## 2015-03-23 NOTE — Telephone Encounter (Signed)
[facility] for PT/OT  thanks

## 2015-03-24 ENCOUNTER — Telehealth: Payer: Self-pay | Admitting: Internal Medicine

## 2015-03-24 DIAGNOSIS — E11618 Type 2 diabetes mellitus with other diabetic arthropathy: Secondary | ICD-10-CM | POA: Diagnosis not present

## 2015-03-24 DIAGNOSIS — J9611 Chronic respiratory failure with hypoxia: Secondary | ICD-10-CM | POA: Diagnosis not present

## 2015-03-24 DIAGNOSIS — E662 Morbid (severe) obesity with alveolar hypoventilation: Secondary | ICD-10-CM | POA: Diagnosis not present

## 2015-03-24 DIAGNOSIS — D860 Sarcoidosis of lung: Secondary | ICD-10-CM | POA: Diagnosis not present

## 2015-03-24 DIAGNOSIS — J9690 Respiratory failure, unspecified, unspecified whether with hypoxia or hypercapnia: Secondary | ICD-10-CM | POA: Diagnosis not present

## 2015-03-24 DIAGNOSIS — J449 Chronic obstructive pulmonary disease, unspecified: Secondary | ICD-10-CM | POA: Diagnosis not present

## 2015-03-24 DIAGNOSIS — I272 Other secondary pulmonary hypertension: Secondary | ICD-10-CM | POA: Diagnosis not present

## 2015-03-24 DIAGNOSIS — I5032 Chronic diastolic (congestive) heart failure: Secondary | ICD-10-CM | POA: Diagnosis not present

## 2015-03-24 DIAGNOSIS — G8929 Other chronic pain: Secondary | ICD-10-CM | POA: Diagnosis not present

## 2015-03-24 DIAGNOSIS — J441 Chronic obstructive pulmonary disease with (acute) exacerbation: Secondary | ICD-10-CM | POA: Diagnosis not present

## 2015-03-24 DIAGNOSIS — E119 Type 2 diabetes mellitus without complications: Secondary | ICD-10-CM | POA: Diagnosis not present

## 2015-03-24 DIAGNOSIS — I10 Essential (primary) hypertension: Secondary | ICD-10-CM | POA: Diagnosis not present

## 2015-03-24 DIAGNOSIS — D869 Sarcoidosis, unspecified: Secondary | ICD-10-CM | POA: Diagnosis not present

## 2015-03-24 DIAGNOSIS — R269 Unspecified abnormalities of gait and mobility: Secondary | ICD-10-CM | POA: Diagnosis not present

## 2015-03-24 DIAGNOSIS — F419 Anxiety disorder, unspecified: Secondary | ICD-10-CM | POA: Diagnosis not present

## 2015-03-24 DIAGNOSIS — G4733 Obstructive sleep apnea (adult) (pediatric): Secondary | ICD-10-CM | POA: Diagnosis not present

## 2015-03-24 DIAGNOSIS — J45909 Unspecified asthma, uncomplicated: Secondary | ICD-10-CM | POA: Diagnosis not present

## 2015-03-24 DIAGNOSIS — M549 Dorsalgia, unspecified: Secondary | ICD-10-CM | POA: Diagnosis not present

## 2015-03-24 NOTE — Addendum Note (Signed)
Addended by: Lowella Dandy on: 03/24/2015 09:35 AM   Modules accepted: Orders

## 2015-03-24 NOTE — Telephone Encounter (Signed)
Printed orders to be signed.

## 2015-03-24 NOTE — Telephone Encounter (Signed)
Daniel from [facility] requesting orders for nursing for 2x wk for 3wks then  1x wk for 4wks [phone]

## 2015-03-24 NOTE — Telephone Encounter (Signed)
All he needs is a verbal.

## 2015-03-25 ENCOUNTER — Inpatient Hospital Stay: Payer: Self-pay | Admitting: Internal Medicine

## 2015-03-25 NOTE — Telephone Encounter (Signed)
Verbal okay given.  

## 2015-03-26 ENCOUNTER — Other Ambulatory Visit: Payer: Self-pay

## 2015-03-29 ENCOUNTER — Other Ambulatory Visit: Payer: Self-pay | Admitting: Internal Medicine

## 2015-03-31 ENCOUNTER — Telehealth: Payer: Self-pay | Admitting: Internal Medicine

## 2015-03-31 ENCOUNTER — Other Ambulatory Visit: Payer: Self-pay | Admitting: Internal Medicine

## 2015-03-31 ENCOUNTER — Telehealth: Payer: Self-pay | Admitting: Pulmonary Disease

## 2015-03-31 DIAGNOSIS — I5032 Chronic diastolic (congestive) heart failure: Secondary | ICD-10-CM | POA: Diagnosis not present

## 2015-03-31 DIAGNOSIS — G8929 Other chronic pain: Secondary | ICD-10-CM | POA: Diagnosis not present

## 2015-03-31 DIAGNOSIS — E119 Type 2 diabetes mellitus without complications: Secondary | ICD-10-CM | POA: Diagnosis not present

## 2015-03-31 DIAGNOSIS — I272 Other secondary pulmonary hypertension: Secondary | ICD-10-CM | POA: Diagnosis not present

## 2015-03-31 DIAGNOSIS — I10 Essential (primary) hypertension: Secondary | ICD-10-CM | POA: Diagnosis not present

## 2015-03-31 DIAGNOSIS — M549 Dorsalgia, unspecified: Secondary | ICD-10-CM | POA: Diagnosis not present

## 2015-03-31 DIAGNOSIS — D860 Sarcoidosis of lung: Secondary | ICD-10-CM | POA: Diagnosis not present

## 2015-03-31 DIAGNOSIS — G4733 Obstructive sleep apnea (adult) (pediatric): Secondary | ICD-10-CM | POA: Diagnosis not present

## 2015-03-31 DIAGNOSIS — J9611 Chronic respiratory failure with hypoxia: Secondary | ICD-10-CM | POA: Diagnosis not present

## 2015-03-31 DIAGNOSIS — F419 Anxiety disorder, unspecified: Secondary | ICD-10-CM | POA: Diagnosis not present

## 2015-03-31 DIAGNOSIS — J45909 Unspecified asthma, uncomplicated: Secondary | ICD-10-CM | POA: Diagnosis not present

## 2015-03-31 DIAGNOSIS — J441 Chronic obstructive pulmonary disease with (acute) exacerbation: Secondary | ICD-10-CM | POA: Diagnosis not present

## 2015-03-31 DIAGNOSIS — E662 Morbid (severe) obesity with alveolar hypoventilation: Secondary | ICD-10-CM | POA: Diagnosis not present

## 2015-03-31 NOTE — Telephone Encounter (Signed)
ATC PT NA. VM is full. WCB

## 2015-04-01 ENCOUNTER — Other Ambulatory Visit: Payer: Self-pay | Admitting: *Deleted

## 2015-04-01 MED ORDER — DICLOFENAC SODIUM 1 % TD GEL: 4.0000 g | Freq: Four times a day (QID) | TRANSDERMAL | Status: DC

## 2015-04-01 MED ORDER — TIOTROPIUM BROMIDE MONOHYDRATE 18 MCG IN CAPS: 18.0000 ug | ORAL_CAPSULE | Freq: Every day | RESPIRATORY_TRACT | Status: DC

## 2015-04-01 MED ORDER — BUDESONIDE-FORMOTEROL FUMARATE 160-4.5 MCG/ACT IN AERO: 2.0000 | INHALATION_SPRAY | Freq: Two times a day (BID) | RESPIRATORY_TRACT | Status: DC

## 2015-04-01 NOTE — Telephone Encounter (Signed)
Please advise, pt has never seen hopp

## 2015-04-01 NOTE — Telephone Encounter (Signed)
ATC pt. Unable to leave message. WCB.  

## 2015-04-02 ENCOUNTER — Telehealth: Payer: Self-pay | Admitting: Adult Health

## 2015-04-02 ENCOUNTER — Other Ambulatory Visit: Payer: Self-pay

## 2015-04-02 ENCOUNTER — Encounter: Payer: Self-pay | Admitting: Internal Medicine

## 2015-04-02 ENCOUNTER — Emergency Department (HOSPITAL_COMMUNITY): Payer: Medicare Other

## 2015-04-02 ENCOUNTER — Inpatient Hospital Stay (HOSPITAL_COMMUNITY)
Admission: EM | Admit: 2015-04-02 | Discharge: 2015-04-11 | DRG: 190 | Disposition: A | Discharge status: Home / Self Care | Payer: Medicare Other | Attending: Internal Medicine | Admitting: Internal Medicine

## 2015-04-02 ENCOUNTER — Encounter (HOSPITAL_COMMUNITY): Payer: Self-pay

## 2015-04-02 DIAGNOSIS — N179 Acute kidney failure, unspecified: Secondary | ICD-10-CM | POA: Diagnosis not present

## 2015-04-02 DIAGNOSIS — E1165 Type 2 diabetes mellitus with hyperglycemia: Secondary | ICD-10-CM | POA: Diagnosis present

## 2015-04-02 DIAGNOSIS — F329 Major depressive disorder, single episode, unspecified: Secondary | ICD-10-CM | POA: Diagnosis not present

## 2015-04-02 DIAGNOSIS — J479 Bronchiectasis, uncomplicated: Secondary | ICD-10-CM | POA: Diagnosis present

## 2015-04-02 DIAGNOSIS — E875 Hyperkalemia: Secondary | ICD-10-CM | POA: Diagnosis not present

## 2015-04-02 DIAGNOSIS — M797 Fibromyalgia: Secondary | ICD-10-CM | POA: Diagnosis not present

## 2015-04-02 DIAGNOSIS — I129 Hypertensive chronic kidney disease with stage 1 through stage 4 chronic kidney disease, or unspecified chronic kidney disease: Secondary | ICD-10-CM | POA: Diagnosis present

## 2015-04-02 DIAGNOSIS — G4733 Obstructive sleep apnea (adult) (pediatric): Secondary | ICD-10-CM | POA: Diagnosis not present

## 2015-04-02 DIAGNOSIS — Z23 Encounter for immunization: Secondary | ICD-10-CM | POA: Diagnosis not present

## 2015-04-02 DIAGNOSIS — I272 Other secondary pulmonary hypertension: Secondary | ICD-10-CM | POA: Diagnosis not present

## 2015-04-02 DIAGNOSIS — R000 Tachycardia, unspecified: Secondary | ICD-10-CM | POA: Diagnosis not present

## 2015-04-02 DIAGNOSIS — I5032 Chronic diastolic (congestive) heart failure: Secondary | ICD-10-CM | POA: Diagnosis present

## 2015-04-02 DIAGNOSIS — Z794 Long term (current) use of insulin: Secondary | ICD-10-CM | POA: Diagnosis not present

## 2015-04-02 DIAGNOSIS — E662 Morbid (severe) obesity with alveolar hypoventilation: Secondary | ICD-10-CM | POA: Diagnosis present

## 2015-04-02 DIAGNOSIS — M549 Dorsalgia, unspecified: Secondary | ICD-10-CM | POA: Diagnosis not present

## 2015-04-02 DIAGNOSIS — E785 Hyperlipidemia, unspecified: Secondary | ICD-10-CM | POA: Diagnosis present

## 2015-04-02 DIAGNOSIS — J9621 Acute and chronic respiratory failure with hypoxia: Secondary | ICD-10-CM | POA: Diagnosis not present

## 2015-04-02 DIAGNOSIS — D869 Sarcoidosis, unspecified: Secondary | ICD-10-CM | POA: Diagnosis present

## 2015-04-02 DIAGNOSIS — I270 Primary pulmonary hypertension: Secondary | ICD-10-CM | POA: Diagnosis not present

## 2015-04-02 DIAGNOSIS — J45909 Unspecified asthma, uncomplicated: Secondary | ICD-10-CM | POA: Diagnosis not present

## 2015-04-02 DIAGNOSIS — Z9981 Dependence on supplemental oxygen: Secondary | ICD-10-CM | POA: Diagnosis not present

## 2015-04-02 DIAGNOSIS — R069 Unspecified abnormalities of breathing: Secondary | ICD-10-CM | POA: Diagnosis not present

## 2015-04-02 DIAGNOSIS — IMO0002 Reserved for concepts with insufficient information to code with codable children: Secondary | ICD-10-CM | POA: Diagnosis present

## 2015-04-02 DIAGNOSIS — Z6841 Body Mass Index (BMI) 40.0 and over, adult: Secondary | ICD-10-CM

## 2015-04-02 DIAGNOSIS — G894 Chronic pain syndrome: Secondary | ICD-10-CM | POA: Diagnosis present

## 2015-04-02 DIAGNOSIS — K219 Gastro-esophageal reflux disease without esophagitis: Secondary | ICD-10-CM | POA: Diagnosis present

## 2015-04-02 DIAGNOSIS — T380X5A Adverse effect of glucocorticoids and synthetic analogues, initial encounter: Secondary | ICD-10-CM | POA: Diagnosis present

## 2015-04-02 DIAGNOSIS — I10 Essential (primary) hypertension: Secondary | ICD-10-CM | POA: Diagnosis not present

## 2015-04-02 DIAGNOSIS — T501X5A Adverse effect of loop [high-ceiling] diuretics, initial encounter: Secondary | ICD-10-CM | POA: Diagnosis not present

## 2015-04-02 DIAGNOSIS — D860 Sarcoidosis of lung: Secondary | ICD-10-CM | POA: Diagnosis present

## 2015-04-02 DIAGNOSIS — Z87891 Personal history of nicotine dependence: Secondary | ICD-10-CM | POA: Diagnosis not present

## 2015-04-02 DIAGNOSIS — F419 Anxiety disorder, unspecified: Secondary | ICD-10-CM | POA: Diagnosis not present

## 2015-04-02 DIAGNOSIS — N182 Chronic kidney disease, stage 2 (mild): Secondary | ICD-10-CM | POA: Diagnosis present

## 2015-04-02 DIAGNOSIS — J441 Chronic obstructive pulmonary disease with (acute) exacerbation: Secondary | ICD-10-CM | POA: Diagnosis present

## 2015-04-02 DIAGNOSIS — G8929 Other chronic pain: Secondary | ICD-10-CM | POA: Diagnosis not present

## 2015-04-02 DIAGNOSIS — B379 Candidiasis, unspecified: Secondary | ICD-10-CM | POA: Diagnosis not present

## 2015-04-02 DIAGNOSIS — Z7951 Long term (current) use of inhaled steroids: Secondary | ICD-10-CM

## 2015-04-02 DIAGNOSIS — R0902 Hypoxemia: Secondary | ICD-10-CM | POA: Diagnosis present

## 2015-04-02 DIAGNOSIS — I5033 Acute on chronic diastolic (congestive) heart failure: Secondary | ICD-10-CM | POA: Diagnosis present

## 2015-04-02 DIAGNOSIS — R0602 Shortness of breath: Secondary | ICD-10-CM | POA: Diagnosis not present

## 2015-04-02 DIAGNOSIS — J9611 Chronic respiratory failure with hypoxia: Secondary | ICD-10-CM | POA: Diagnosis not present

## 2015-04-02 DIAGNOSIS — E871 Hypo-osmolality and hyponatremia: Secondary | ICD-10-CM | POA: Diagnosis not present

## 2015-04-02 DIAGNOSIS — J069 Acute upper respiratory infection, unspecified: Secondary | ICD-10-CM | POA: Diagnosis not present

## 2015-04-02 DIAGNOSIS — Z7982 Long term (current) use of aspirin: Secondary | ICD-10-CM

## 2015-04-02 DIAGNOSIS — E119 Type 2 diabetes mellitus without complications: Secondary | ICD-10-CM | POA: Diagnosis not present

## 2015-04-02 LAB — CBC WITH DIFFERENTIAL/PLATELET
Basophils Absolute: 0 10*3/uL (ref 0.0–0.1)
Basophils Relative: 1 % (ref 0–1)
EOS PCT: 3 % (ref 0–5)
Eosinophils Absolute: 0.3 10*3/uL (ref 0.0–0.7)
HEMATOCRIT: 42.6 % (ref 36.0–46.0)
HEMOGLOBIN: 13.7 g/dL (ref 12.0–15.0)
LYMPHS ABS: 1.5 10*3/uL (ref 0.7–4.0)
Lymphocytes Relative: 18 % (ref 12–46)
MCH: 25.2 pg — ABNORMAL LOW (ref 26.0–34.0)
MCHC: 32.2 g/dL (ref 30.0–36.0)
MCV: 78.5 fL (ref 78.0–100.0)
Monocytes Absolute: 0.6 10*3/uL (ref 0.1–1.0)
Monocytes Relative: 7 % (ref 3–12)
Neutro Abs: 6 10*3/uL (ref 1.7–7.7)
Neutrophils Relative %: 71 % (ref 43–77)
Platelets: 143 10*3/uL — ABNORMAL LOW (ref 150–400)
RBC: 5.43 MIL/uL — AB (ref 3.87–5.11)
RDW: 17 % — ABNORMAL HIGH (ref 11.5–15.5)
WBC: 8.5 10*3/uL (ref 4.0–10.5)

## 2015-04-02 LAB — BASIC METABOLIC PANEL
Anion gap: 12 (ref 5–15)
BUN: 13 mg/dL (ref 6–20)
CALCIUM: 8.9 mg/dL (ref 8.9–10.3)
CO2: 29 mmol/L (ref 22–32)
Chloride: 95 mmol/L — ABNORMAL LOW (ref 101–111)
Creatinine, Ser: 1.34 mg/dL — ABNORMAL HIGH (ref 0.44–1.00)
GFR calc Af Amer: 53 mL/min — ABNORMAL LOW (ref >60)
GFR, EST NON AFRICAN AMERICAN: 46 mL/min — AB (ref >60)
Glucose, Bld: 205 mg/dL — ABNORMAL HIGH (ref 65–99)
Potassium: 3.6 mmol/L (ref 3.5–5.1)
SODIUM: 136 mmol/L (ref 135–145)

## 2015-04-02 MED ORDER — IPRATROPIUM-ALBUTEROL 0.5-2.5 (3) MG/3ML IN SOLN
3.0000 mL | Freq: Four times a day (QID) | RESPIRATORY_TRACT | Status: DC
  Administered 2015-04-03 – 2015-04-05 (×11): 3 mL via RESPIRATORY_TRACT
  Filled 2015-04-02 (×11): qty 3

## 2015-04-02 MED ORDER — METHYLPREDNISOLONE SODIUM SUCC 125 MG IJ SOLR
125.0000 mg | Freq: Once | INTRAMUSCULAR | Status: AC
  Administered 2015-04-02: 125 mg via INTRAVENOUS
  Filled 2015-04-02: qty 2

## 2015-04-02 MED ORDER — ALBUTEROL SULFATE (2.5 MG/3ML) 0.083% IN NEBU
5.0000 mg | INHALATION_SOLUTION | Freq: Once | RESPIRATORY_TRACT | Status: AC
  Administered 2015-04-02: 5 mg via RESPIRATORY_TRACT
  Filled 2015-04-02: qty 6

## 2015-04-02 MED ORDER — ALBUTEROL (5 MG/ML) CONTINUOUS INHALATION SOLN
10.0000 mg/h | INHALATION_SOLUTION | Freq: Once | RESPIRATORY_TRACT | Status: AC
  Administered 2015-04-02: 10 mg/h via RESPIRATORY_TRACT
  Filled 2015-04-02: qty 20

## 2015-04-02 MED ORDER — IPRATROPIUM-ALBUTEROL 0.5-2.5 (3) MG/3ML IN SOLN
3.0000 mL | Freq: Once | RESPIRATORY_TRACT | Status: AC
  Administered 2015-04-02: 3 mL via RESPIRATORY_TRACT
  Filled 2015-04-02: qty 3

## 2015-04-02 MED ORDER — ONDANSETRON HCL 4 MG/2ML IJ SOLN
4.0000 mg | Freq: Once | INTRAMUSCULAR | Status: AC
  Administered 2015-04-02: 4 mg via INTRAVENOUS

## 2015-04-02 MED ORDER — IPRATROPIUM BROMIDE 0.02 % IN SOLN
0.5000 mg | Freq: Once | RESPIRATORY_TRACT | Status: AC
  Administered 2015-04-02: 0.5 mg via RESPIRATORY_TRACT
  Filled 2015-04-02: qty 2.5

## 2015-04-02 MED ORDER — ALBUTEROL SULFATE (2.5 MG/3ML) 0.083% IN NEBU: 10.0000 mg | INHALATION_SOLUTION | Freq: Once | RESPIRATORY_TRACT | Status: DC

## 2015-04-02 MED ORDER — METHYLPREDNISOLONE SODIUM SUCC 125 MG IJ SOLR: 125.0000 mg | Freq: Once | INTRAMUSCULAR | Status: DC

## 2015-04-02 MED ORDER — MAGNESIUM SULFATE 2 GM/50ML IV SOLN
2.0000 g | Freq: Once | INTRAVENOUS | Status: AC
  Administered 2015-04-03: 2 g via INTRAVENOUS
  Filled 2015-04-02: qty 50

## 2015-04-02 MED ORDER — HYDROMORPHONE HCL 1 MG/ML IJ SOLN
1.0000 mg | Freq: Once | INTRAMUSCULAR | Status: AC
  Administered 2015-04-02: 1 mg via INTRAVENOUS
  Filled 2015-04-02: qty 1

## 2015-04-02 MED ORDER — ONDANSETRON HCL 4 MG/2ML IJ SOLN
INTRAMUSCULAR | Status: AC
  Filled 2015-04-02: qty 2

## 2015-04-02 NOTE — Telephone Encounter (Signed)
LMTCB x 1 

## 2015-04-02 NOTE — H&P (Signed)
[facility] Admission History and Physical       [name] QPY:[id number] DOB: Apr 17, 1966 DOA: 04/02/2015  Referring physician: EDP PCP: Gwendolyn Grant, MD  Specialists:   Chief Complaint: SOB  HPI: [name] is a 49 y.o. female with a history of COPD, CHF, Pulmonary HTN,  Sarcoid, and DM2 who presents to the ED with complaints of worsening SOB and wheezing over the past 3 days.  She denies any fevers or chills or Cough.   She was found to have hypoxia in the ED to 81% and was placed on 4 liters of NCO2.     Review of Systems:  Constitutional: No Weight Loss, No Weight Gain, Night Sweats, Fevers, Chills, Dizziness, Light Headedness, Fatigue, or Generalized Weakness HEENT: No Headaches, Difficulty Swallowing,Tooth/Dental Problems,Sore Throat,  No Sneezing, Rhinitis, Ear Ache, Nasal Congestion, or Post Nasal Drip,  Cardio-vascular:  No Chest pain, Orthopnea, PND, Edema in Lower Extremities, Anasarca, Dizziness, Palpitations  Resp:  +Dyspnea, No DOE, No Productive Cough, No Non-Productive Cough, No Hemoptysis, +Wheezing.    GI: No Heartburn, Indigestion, Abdominal Pain, Nausea, Vomiting, Diarrhea, Constipation, Hematemesis, Hematochezia, Melena, Change in Bowel Habits,  Loss of Appetite  GU: No Dysuria, No Change in Color of Urine, No Urgency or Urinary Frequency, No Flank pain.  Musculoskeletal: No Joint Pain or Swelling, No Decreased Range of Motion, No Back Pain.  Neurologic: No Syncope, No Seizures, Muscle Weakness, Paresthesia, Vision Disturbance or Loss, No Diplopia, No Vertigo, No Difficulty Walking,  Skin: No Rash or Lesions. Psych: No Change in Mood or Affect, No Depression or Anxiety, No Memory loss, No Confusion, or Hallucinations   Past Medical History  Diagnosis Date  . Hypertension   . Hyperlipidemia   . Chronic headache   . Fibromyalgia     daily narcotics  . Anxiety     hx chronic BZ use, stopped 07/2010  . Anemia   . Pulmonary sarcoidosis       unimpressive CT chest 2011  . Colonic polyp   . GERD (gastroesophageal reflux disease)   . ALLERGIC RHINITIS   . Asthma   . CHF (congestive heart failure)     Diastolic with fluid overload, May, 2012, LVEF 60%  . Morbid obesity   . Depression   . Panic attacks   . Diabetes mellitus   . COPD (chronic obstructive pulmonary disease)     on home O2, moderate airflow obstruction, suspect d/t emphysema  . Obesity   . Elevated LFTs 09/2011  . Ovarian cyst   . Chronic back pain   . Sleep apnea      CPAP  . Fatty liver   . Esophagitis   . Gastritis   . Internal hemorrhoids   . Adenomatous colon polyp 12/03/09    [facility] in [place], [place]     Past Surgical History  Procedure Laterality Date  . Polypectomy  2011  . Lumbar microdiscectomy  07/06/2011    R L4-5, stern  . Back surgery    . Carpal tunnel release    . Steroid spinal injections    . Hysteroscopy w/d&c N/A 03/25/2013    Procedure: DILATATION AND CURETTAGE /HYSTEROSCOPY;  Surgeon: [name], MD;  Location: [facility];  Service: Gynecology;  Laterality: N/A;  . Dilation and curettage of uterus    . Cardiac catheterization N/A 03/17/2015    Procedure: Right Heart Cath;  Surgeon: Larey Dresser, MD;  Location: [facility];  Service: Cardiovascular;  Laterality: N/A;  Prior to Admission medications   Medication Sig Start Date End Date Taking? Authorizing Provider  albuterol (PROVENTIL) (2.5 MG/3ML) 0.083% nebulizer solution INHALE CONTENTS OF 1 VIAL VIA NEBULIZER EVERY 6 HOURS AS NEEDED FOR WHEEZING OR SHORTNESS OF BREATH 03/30/15  Yes Rowe Clack, MD  ALPRAZolam Duanne Moron) 1 MG tablet Take 1 tablet (1 mg total) by mouth 3 (three) times daily. 01/25/15  Yes Rowe Clack, MD  aspirin EC 81 MG tablet Take 81 mg by mouth daily.   Yes Historical Provider, MD  BIOTIN PO Take 1 capsule by mouth daily.   Yes Historical Provider, MD  budesonide-formoterol (SYMBICORT) 160-4.5 MCG/ACT inhaler Inhale 2 puffs  into the lungs 2 (two) times daily. 04/01/15  Yes Tammy S Parrett, NP  cetirizine (ZYRTEC) 1 MG/ML syrup Take 10 mLs (10 mg total) by mouth at bedtime. 01/06/15  Yes Rowe Clack, MD  diclofenac sodium (VOLTAREN) 1 % GEL Apply 4 g topically 4 (four) times daily. 04/01/15  Yes Rowe Clack, MD  EMBEDA 20-0.8 MG CPCR Take 1 tablet by mouth daily. 03/09/15  Yes Historical Provider, MD  esomeprazole (NEXIUM) 40 MG capsule Take 40 mg by mouth 2 (two) times daily before a meal.   Yes Historical Provider, MD  fluticasone (FLONASE) 50 MCG/ACT nasal spray Place 2 sprays into both nostrils daily. Patient taking differently: Place 2 sprays into both nostrils daily as needed for allergies or rhinitis.  10/19/14  Yes Rowe Clack, MD  gabapentin (NEURONTIN) 300 MG capsule Take 300 mg by mouth daily as needed (neuropathy pain).    Yes Historical Provider, MD  glimepiride (AMARYL) 1 MG tablet Take 1 mg by mouth daily with breakfast.   Yes Historical Provider, MD  hydrOXYzine (ATARAX/VISTARIL) 10 MG tablet TAKE 1 TABLET BY MOUTH THREE TIMES DAILY AS NEEDED FOR ITCHING 08/27/14  Yes Biagio Borg, MD  insulin aspart (NOVOLOG FLEXPEN) 100 UNIT/ML FlexPen Inject 5 Units into the skin 3 (three) times daily with meals. 12/28/14  Yes Shanker Kristeen Mans, MD  Insulin Glargine (LANTUS SOLOSTAR) 100 UNIT/ML Solostar Pen Inject 40 Units into the skin 2 (two) times daily. 01/13/15  Yes Rowe Clack, MD  Multiple Vitamin (MULTIVITAMIN) capsule Take 1 capsule by mouth once a week. Mondays   Yes Historical Provider, MD  omega-3 fish oil (MAXEPA) 1000 MG CAPS capsule Take 1 capsule by mouth daily.   Yes Historical Provider, MD  potassium chloride SA (K-DUR,KLOR-CON) 20 MEQ tablet Take 2 tablets (40 mEq total) by mouth 2 (two) times daily. 03/21/15  Yes Verlee Monte, MD  PROAIR HFA 108 (90 BASE) MCG/ACT inhaler INHALE 2 PUFFS BY MOUTH EVERY 6 HOURS AS NEEDED FOR WHEEZING OR SHORTNESS OF BREATH 03/30/15  Yes Rowe Clack, MD  spironolactone (ALDACTONE) 25 MG tablet TAKE ONE TABLET BY MOUTH TWICE DAILY 03/30/15  Yes Rowe Clack, MD  tiotropium (SPIRIVA HANDIHALER) 18 MCG inhalation capsule Place 1 capsule (18 mcg total) into inhaler and inhale daily. 04/01/15  Yes Tammy S Parrett, NP  tiZANidine (ZANAFLEX) 2 MG tablet Take 2 mg by mouth 2 (two) times daily as needed for muscle spasms.  03/10/15  Yes Historical Provider, MD  torsemide (DEMADEX) 20 MG tablet Take 2 tablets (40 mg total) by mouth 2 (two) times daily. 03/21/15  Yes Verlee Monte, MD  varenicline (CHANTIX) 0.5 MG tablet Take 1 tablet (0.5 mg total) by mouth 2 (two) times daily. 02/08/15  Yes Rowe Clack, MD  venlafaxine XR [facility]  XR) 37.5 MG 24 hr capsule Take 1 capsule (37.5 mg total) by mouth daily with breakfast. 11/13/14  Yes Rowe Clack, MD     Allergies  Allergen Reactions  . Simvastatin Other (See Comments)    Severe leg pain and burning  . Sulfamethoxazole-Trimethoprim Nausea And Vomiting    Causes Projectile Vomiting  . Zolpidem Tartrate Other (See Comments)    Chest pain  . Azithromycin Other (See Comments)    Resistant to med  . Ceftin [Cefuroxime] Swelling and Other (See Comments)    MOUTH SWELLING  . Doxycycline Other (See Comments)    Resistant to med  . Latex Itching and Rash    Also burning sensations  . Metformin And Related Diarrhea    Severe diarrhea  . Metolazone Other (See Comments)    MYALGIAS  . Metronidazole Nausea And Vomiting  . Ciprofloxacin Nausea Only  . Tramadol Itching and Nausea Only    Social History:  reports that she quit smoking about 4 months ago. Her smoking use included Cigarettes. She has a 7.25 pack-year smoking history. She has never used smokeless tobacco. She reports that she drinks alcohol. She reports that she uses illicit drugs (Cocaine and Marijuana).    Family History  Problem Relation Age of Onset  . Hypertension Mother   . Heart disease Mother   . Emphysema  Father   . Hypertension Father   . Stomach cancer Father   . Heart disease Father   . Allergies Brother   . Stomach cancer Brother   . Congestive Heart Failure Brother   . Diabetes type II Brother   . Heart disease Sister   . Diabetes type II Sister   . Heart attack Sister   . Stroke Sister   . Hypertension Sister   . Heart failure Sister     diastolic  . Hypertension Sister        Physical Exam:  GEN:  Pleasant Obese 49 y.o. African American female examined and in no acute distress; cooperative with exam Filed Vitals:   04/02/15 2145 04/02/15 2230 04/02/15 2300 04/02/15 2330  BP:      Pulse: 110 107 108 107  Temp:      TempSrc:      Resp: 21 24 19    SpO2: 93% 94% 94% 95%   Blood pressure 133/60, pulse 107, temperature 98.5 F (36.9 C), temperature source Oral, resp. rate 19, last menstrual period 10/16/2012, SpO2 95 %. PSYCH: She is alert and oriented x4; does not appear anxious does not appear depressed; affect is normal HEENT: Normocephalic and Atraumatic, Mucous membranes pink; PERRLA; EOM intact; Fundi:  Benign;  No scleral icterus, Nares: Patent, Oropharynx: Clear, Fair Dentition,    Neck:  FROM, No Cervical Lymphadenopathy nor Thyromegaly or Carotid Bruit; No JVD; Breasts:: Not examined CHEST WALL: No tenderness CHEST: Decreased Breath sounds,  +Expiratory Wheezes HEART: Regular rate and rhythm; no murmurs rubs or gallops BACK: No kyphosis or scoliosis; No CVA tenderness ABDOMEN: Positive Bowel Sounds,  Obese, Soft Non-Tender, No Rebound or Guarding; No Masses, No Organomegaly, No Pannus; No Intertriginous candida. Rectal Exam: Not done EXTREMITIES: No Cyanosis, Clubbing, or Edema; No Ulcerations. Genitalia: not examined PULSES: 2+ and symmetric SKIN: Normal hydration no rash or ulceration CNS:  Alert and Oriented x 4, No Focal Deficits Vascular: pulses palpable throughout    Labs on Admission:  Basic Metabolic Panel:  Recent Labs Lab 04/02/15 2006  NA  136  K 3.6  CL 95*  CO2  29  GLUCOSE 205*  BUN 13  CREATININE 1.34*  CALCIUM 8.9   Liver Function Tests: No results for input(s): AST, ALT, ALKPHOS, BILITOT, PROT, ALBUMIN in the last 168 hours. No results for input(s): LIPASE, AMYLASE in the last 168 hours. No results for input(s): AMMONIA in the last 168 hours. CBC:  Recent Labs Lab 04/02/15 2006  WBC 8.5  NEUTROABS 6.0  HGB 13.7  HCT 42.6  MCV 78.5  PLT 143*   Cardiac Enzymes: No results for input(s): CKTOTAL, CKMB, CKMBINDEX, TROPONINI in the last 168 hours.  BNP (last 3 results)  Recent Labs  12/13/14 0820 12/18/14 1846 03/19/15 0441  BNP 29.7 231.0* 61.9    ProBNP (last 3 results)  Recent Labs  04/17/14 0120 07/20/14 1817  PROBNP 44.7 39.7    CBG: No results for input(s): GLUCAP in the last 168 hours.  Radiological Exams on Admission: Dg Chest 2 View  04/02/2015   CLINICAL DATA:  Patient with shortness of breath. History of pulmonary hypertension.  EXAM: CHEST  2 VIEW  COMPARISON:  Chest radiograph 03/22/2015  FINDINGS: Monitoring leads overlie the patient. Stable cardiac and mediastinal contours. Unchanged bilateral coarse interstitial opacities predominately involving the lower lungs bilaterally. No superimposed consolidative opacity. No pleural effusion or pneumothorax. Regional skeleton is unremarkable.  IMPRESSION: No acute cardiopulmonary process.  Unchanged basilar scarring compatible with sarcoidosis.   Electronically Signed   By: Lovey Newcomer M.D.   On: 04/02/2015 19:14     EKG: Independently reviewed.        Assessment/Plan:  49 y.o. female with  Principal Problem:   1.    COPD exacerbation   IV Steroid Taper   DuonNebs   O2   Monitor O2    Active Problems:   2.    Chronic diastolic CHF (congestive heart failure)   Continue Torsemide   Continue KCl   Continue Spironolactone       3.    PULMONARY SARCOIDOSIS   IV Steroids      4.    Essential hypertension   On Torsemide,  Spironolactone        5.    Diabetes type 2, uncontrolled   SSI Coverage PRN     6.    DVT Prophylaxis   Lovenox     Code Status:     FULL CODE        Family Communication:   No Family Present    Disposition Plan:    Inpatient  Status        Time spent:  [facility] Pager [phone]   If [place] Please Contact the Day Rounding Team MD for [facility]  If 7PM-7AM, Please Contact Night-Floor Coverage  [email or web address] Password [facility] 04/02/2015, 11:42 PM     ADDENDUM:   Patient was seen and examined on 04/02/2015

## 2015-04-02 NOTE — Patient Outreach (Signed)
Received telephone call from [place], [facility] LCSW, with an update regarding LCSW assessment. Brooke informed this RNCM patient  Stated she was having increased shortness of breath, but was not in an emergent situation.  In addition, patient had indicated she would like a referral for outpatient mental health counseling, which she prepares come from her primary care physician.  This RNCM attempted to make contact with this patient through all numbers provided as contacts. These efforts were unsuccessful.

## 2015-04-02 NOTE — ED Notes (Signed)
Pt recently diagnosed with pulmonary hypertension, has apt. With cardiologist on Monday.  Pt reports she went out to the hair salon yesterday afternoon and became SOB.  Pt tried her home inhalers with no relief.  Pt is on 4L at all times.

## 2015-04-02 NOTE — Telephone Encounter (Signed)
Spoke with patient - states that she has been feeling terrible today and her breathing is getting worse.  Pt states that she never received our call or voicemail earlier today when we left a message.  Pt c/o increased SOB, reports as severe.  Patient is normally on 3 Liters O2 at rest and 4 Liters O2 with exertion Patient states that she has had to increase her O2 to 5 Liters with rest/exertion Pt notes that with little movement her SOB worsens and she is having pain in her lung, cough and her HR is elevated. States that she feels her lung are filling up and he cannot breathe. I advised the patient that with the symptoms she is having she needs to be evaluated and she will get the best care for her current symptoms at ED.  Pt advised to go to ED to be evaluated and she agreed to this - aware that I will send to Dr Elsworth Soho to make him aware.   Will send to Dr Elsworth Soho as Juluis Rainier.

## 2015-04-02 NOTE — Patient Outreach (Signed)
[facility]) Care Management  04/02/2015  [name] 1966/06/28 [id number]   [place] made referral for patient due to recent feelings of depression. CSW contacted patient on 04/02/15 and verification of 2 HIPPA identifiers was completed. CSW introduced herself and explained the [employer] as patient was unsure if she remembered talking to anyone from our program. Patient stated that she has been experiencing feelings of depression and is currently on antidepressants. Client reports taking Zoloft for many years with positive outcomes before becoming tolerant of medication and switching to effexor. CSW provided numerous resources that could assist patient. CSW questioned if patient was open to supportive group counseling but patient reported that she would not feel comfortable in a group setting. CSW suggested both [place] [facility] [place] for individual therapy. Patient then stated that she would feel more comfortable going to her PCP and having her PCP refer her to therapy as her PCP is very aware of her depressive history. CSW asked if there would be anything else that she could help her with. Patient stated that she has been experiencing shortness or breath and racing heart lately. CSW let patient know that she will inform her nurse Erenest Rasher of these recent health concerns. CSW informed her that she will now close her case from a social work stand point. CSW contacted nurse Erenest Rasher to inform her of recent updates.   Eula Fried, BSW, MSW, [email or web address] Phone: [phone] Fax: [phone]

## 2015-04-02 NOTE — Telephone Encounter (Signed)
Attempted to call pt. No answer, unable to leave a message. Per our protocol, this message will be closed.

## 2015-04-02 NOTE — ED Notes (Addendum)
Pt standing up from bed to commode and desating down to 81% on 4L. MD notified.

## 2015-04-03 LAB — GLUCOSE, CAPILLARY
GLUCOSE-CAPILLARY: 339 mg/dL — AB (ref 65–99)
Glucose-Capillary: 443 mg/dL — ABNORMAL HIGH (ref 65–99)
Glucose-Capillary: 396 mg/dL — ABNORMAL HIGH (ref 65–99)
Glucose-Capillary: 380 mg/dL — ABNORMAL HIGH (ref 65–99)

## 2015-04-03 LAB — CBC
HEMATOCRIT: 41.9 % (ref 36.0–46.0)
HEMOGLOBIN: 13.4 g/dL (ref 12.0–15.0)
MCH: 25.1 pg — AB (ref 26.0–34.0)
MCHC: 32 g/dL (ref 30.0–36.0)
MCV: 78.5 fL (ref 78.0–100.0)
Platelets: 145 10*3/uL — ABNORMAL LOW (ref 150–400)
RBC: 5.34 MIL/uL — AB (ref 3.87–5.11)
RDW: 17.2 % — ABNORMAL HIGH (ref 11.5–15.5)
WBC: 9.7 10*3/uL (ref 4.0–10.5)

## 2015-04-03 LAB — CBG MONITORING, ED: GLUCOSE-CAPILLARY: 458 mg/dL — AB (ref 65–99)

## 2015-04-03 LAB — BASIC METABOLIC PANEL
ANION GAP: 17 — AB (ref 5–15)
BUN: 19 mg/dL (ref 6–20)
CALCIUM: 8.8 mg/dL — AB (ref 8.9–10.3)
CO2: 21 mmol/L — AB (ref 22–32)
Chloride: 91 mmol/L — ABNORMAL LOW (ref 101–111)
Creatinine, Ser: 1.55 mg/dL — ABNORMAL HIGH (ref 0.44–1.00)
GFR calc Af Amer: 45 mL/min — ABNORMAL LOW (ref >60)
GFR calc non Af Amer: 39 mL/min — ABNORMAL LOW (ref >60)
Glucose, Bld: 505 mg/dL — ABNORMAL HIGH (ref 65–99)
Potassium: 4.8 mmol/L (ref 3.5–5.1)
Sodium: 129 mmol/L — ABNORMAL LOW (ref 135–145)

## 2015-04-03 LAB — MRSA PCR SCREENING: MRSA by PCR: NEGATIVE

## 2015-04-03 MED ORDER — HYDROMORPHONE HCL 1 MG/ML IJ SOLN
0.5000 mg | INTRAMUSCULAR | Status: DC | PRN
  Administered 2015-04-03 (×4): 0.5 mg via INTRAVENOUS
  Administered 2015-04-04 – 2015-04-07 (×15): 1 mg via INTRAVENOUS
  Filled 2015-04-03 (×18): qty 1

## 2015-04-03 MED ORDER — INSULIN ASPART 100 UNIT/ML ~~LOC~~ SOLN
0.0000 [IU] | Freq: Every day | SUBCUTANEOUS | Status: DC
  Filled 2015-04-03: qty 1

## 2015-04-03 MED ORDER — ACETAMINOPHEN 325 MG PO TABS
650.0000 mg | ORAL_TABLET | Freq: Four times a day (QID) | ORAL | Status: DC | PRN
  Administered 2015-04-06 – 2015-04-09 (×5): 650 mg via ORAL
  Filled 2015-04-03 (×5): qty 2

## 2015-04-03 MED ORDER — INSULIN ASPART 100 UNIT/ML ~~LOC~~ SOLN
7.0000 [IU] | Freq: Once | SUBCUTANEOUS | Status: AC
  Administered 2015-04-03: 7 [IU] via SUBCUTANEOUS

## 2015-04-03 MED ORDER — POTASSIUM CHLORIDE CRYS ER 20 MEQ PO TBCR
40.0000 meq | EXTENDED_RELEASE_TABLET | Freq: Two times a day (BID) | ORAL | Status: DC
  Administered 2015-04-03 (×2): 40 meq via ORAL
  Filled 2015-04-03 (×2): qty 2

## 2015-04-03 MED ORDER — ASPIRIN EC 81 MG PO TBEC
81.0000 mg | DELAYED_RELEASE_TABLET | Freq: Every day | ORAL | Status: DC
  Administered 2015-04-03 – 2015-04-11 (×9): 81 mg via ORAL
  Filled 2015-04-03 (×9): qty 1

## 2015-04-03 MED ORDER — TORSEMIDE 20 MG PO TABS
40.0000 mg | ORAL_TABLET | Freq: Two times a day (BID) | ORAL | Status: DC
  Administered 2015-04-03: 40 mg via ORAL
  Filled 2015-04-03 (×3): qty 2

## 2015-04-03 MED ORDER — ALUM & MAG HYDROXIDE-SIMETH 200-200-20 MG/5ML PO SUSP
30.0000 mL | Freq: Four times a day (QID) | ORAL | Status: DC | PRN
  Administered 2015-04-03: 30 mL via ORAL
  Filled 2015-04-03: qty 30

## 2015-04-03 MED ORDER — HYDROXYZINE HCL 10 MG PO TABS
10.0000 mg | ORAL_TABLET | Freq: Three times a day (TID) | ORAL | Status: DC | PRN
  Filled 2015-04-03: qty 1

## 2015-04-03 MED ORDER — INSULIN ASPART 100 UNIT/ML ~~LOC~~ SOLN
0.0000 [IU] | Freq: Three times a day (TID) | SUBCUTANEOUS | Status: DC
  Administered 2015-04-03: 15 [IU] via SUBCUTANEOUS
  Administered 2015-04-03 – 2015-04-04 (×4): 20 [IU] via SUBCUTANEOUS

## 2015-04-03 MED ORDER — PANTOPRAZOLE SODIUM 40 MG PO TBEC
40.0000 mg | DELAYED_RELEASE_TABLET | Freq: Every day | ORAL | Status: DC
  Administered 2015-04-03 – 2015-04-11 (×9): 40 mg via ORAL
  Filled 2015-04-03 (×7): qty 1

## 2015-04-03 MED ORDER — METHYLPREDNISOLONE SODIUM SUCC 125 MG IJ SOLR
125.0000 mg | Freq: Once | INTRAMUSCULAR | Status: AC
  Administered 2015-04-03: 125 mg via INTRAVENOUS
  Filled 2015-04-03: qty 2

## 2015-04-03 MED ORDER — GABAPENTIN 300 MG PO CAPS
300.0000 mg | ORAL_CAPSULE | Freq: Every day | ORAL | Status: DC | PRN
  Administered 2015-04-05 – 2015-04-06 (×2): 300 mg via ORAL
  Filled 2015-04-03 (×3): qty 1

## 2015-04-03 MED ORDER — PNEUMOCOCCAL VAC POLYVALENT 25 MCG/0.5ML IJ INJ
0.5000 mL | INJECTION | INTRAMUSCULAR | Status: AC
Start: 2015-04-04 — End: 2015-04-04
  Administered 2015-04-04: 0.5 mL via INTRAMUSCULAR
  Filled 2015-04-03: qty 0.5

## 2015-04-03 MED ORDER — METHYLPREDNISOLONE SODIUM SUCC 125 MG IJ SOLR
80.0000 mg | Freq: Three times a day (TID) | INTRAMUSCULAR | Status: DC
  Administered 2015-04-03 – 2015-04-04 (×3): 80 mg via INTRAVENOUS
  Filled 2015-04-03: qty 2
  Filled 2015-04-03: qty 1.28
  Filled 2015-04-03: qty 2
  Filled 2015-04-03 (×2): qty 1.28
  Filled 2015-04-03: qty 2

## 2015-04-03 MED ORDER — OXYCODONE HCL 5 MG PO TABS
5.0000 mg | ORAL_TABLET | ORAL | Status: DC | PRN
  Administered 2015-04-03 – 2015-04-07 (×18): 5 mg via ORAL
  Filled 2015-04-03 (×19): qty 1

## 2015-04-03 MED ORDER — INSULIN GLARGINE 100 UNIT/ML ~~LOC~~ SOLN
40.0000 [IU] | Freq: Two times a day (BID) | SUBCUTANEOUS | Status: DC
  Administered 2015-04-03 (×3): 40 [IU] via SUBCUTANEOUS
  Filled 2015-04-03 (×4): qty 0.4

## 2015-04-03 MED ORDER — HYDROMORPHONE HCL 1 MG/ML IJ SOLN
0.5000 mg | INTRAMUSCULAR | Status: DC | PRN
  Administered 2015-04-03 (×2): 1 mg via INTRAVENOUS
  Filled 2015-04-03 (×2): qty 1

## 2015-04-03 MED ORDER — ADULT MULTIVITAMIN W/MINERALS CH
1.0000 | ORAL_TABLET | ORAL | Status: DC
  Administered 2015-04-05: 1 via ORAL
  Filled 2015-04-03: qty 1

## 2015-04-03 MED ORDER — VENLAFAXINE HCL ER 37.5 MG PO CP24
37.5000 mg | ORAL_CAPSULE | Freq: Every day | ORAL | Status: DC
  Administered 2015-04-03 – 2015-04-11 (×9): 37.5 mg via ORAL
  Filled 2015-04-03 (×11): qty 1

## 2015-04-03 MED ORDER — SPIRONOLACTONE 25 MG PO TABS
25.0000 mg | ORAL_TABLET | Freq: Two times a day (BID) | ORAL | Status: DC
Start: 2015-04-03 — End: 2015-04-03
  Administered 2015-04-03: 25 mg via ORAL
  Filled 2015-04-03 (×3): qty 1

## 2015-04-03 MED ORDER — ENOXAPARIN SODIUM 40 MG/0.4ML ~~LOC~~ SOLN
40.0000 mg | Freq: Every day | SUBCUTANEOUS | Status: DC
  Administered 2015-04-03 – 2015-04-04 (×2): 40 mg via SUBCUTANEOUS
  Filled 2015-04-03 (×4): qty 0.4

## 2015-04-03 MED ORDER — ACETAMINOPHEN 650 MG RE SUPP
650.0000 mg | Freq: Four times a day (QID) | RECTAL | Status: DC | PRN
  Filled 2015-04-03: qty 1

## 2015-04-03 MED ORDER — ONDANSETRON HCL 4 MG/2ML IJ SOLN
4.0000 mg | Freq: Four times a day (QID) | INTRAMUSCULAR | Status: DC | PRN
  Administered 2015-04-04 – 2015-04-08 (×2): 4 mg via INTRAVENOUS
  Filled 2015-04-03 (×2): qty 2

## 2015-04-03 MED ORDER — SODIUM CHLORIDE 0.9 % IV SOLN
INTRAVENOUS | Status: DC
  Administered 2015-04-03: via INTRAVENOUS

## 2015-04-03 MED ORDER — ALPRAZOLAM 0.5 MG PO TABS
1.0000 mg | ORAL_TABLET | Freq: Three times a day (TID) | ORAL | Status: DC
  Administered 2015-04-03 – 2015-04-11 (×25): 1 mg via ORAL
  Filled 2015-04-03 (×27): qty 2

## 2015-04-03 MED ORDER — LEVOFLOXACIN IN D5W 750 MG/150ML IV SOLN
750.0000 mg | INTRAVENOUS | Status: DC
Start: 2015-04-03 — End: 2015-04-07
  Administered 2015-04-03 – 2015-04-06 (×4): 750 mg via INTRAVENOUS
  Filled 2015-04-03 (×6): qty 150

## 2015-04-03 MED ORDER — ONDANSETRON HCL 4 MG PO TABS
4.0000 mg | ORAL_TABLET | Freq: Four times a day (QID) | ORAL | Status: DC | PRN
  Administered 2015-04-03 – 2015-04-10 (×2): 4 mg via ORAL
  Filled 2015-04-03 (×2): qty 1

## 2015-04-03 MED ORDER — OMEGA-3-ACID ETHYL ESTERS 1 G PO CAPS
1.0000 | ORAL_CAPSULE | Freq: Every day | ORAL | Status: DC
  Administered 2015-04-03 – 2015-04-11 (×9): 1 g via ORAL
  Filled 2015-04-03 (×10): qty 1

## 2015-04-03 MED ORDER — LORATADINE 10 MG PO TABS
10.0000 mg | ORAL_TABLET | Freq: Every day | ORAL | Status: DC
  Administered 2015-04-03 – 2015-04-10 (×8): 10 mg via ORAL
  Filled 2015-04-03 (×10): qty 1

## 2015-04-03 MED ORDER — INSULIN ASPART 100 UNIT/ML ~~LOC~~ SOLN
0.0000 [IU] | Freq: Three times a day (TID) | SUBCUTANEOUS | Status: DC
  Administered 2015-04-03: 10 [IU] via SUBCUTANEOUS

## 2015-04-03 NOTE — Progress Notes (Signed)
[facility] PROGRESS NOTE   [name] TML:[id number] DOB: [birth year] DOA: 04/02/2015 PCP: Gwendolyn Grant, MD  HPI/Subjective: Still has shortness of breath, less wheezing and cough.  Assessment/Plan: Principal Problem:   COPD exacerbation Active Problems:   PULMONARY SARCOIDOSIS   Essential hypertension   Obstructive sleep apnea   Diabetes type 2, uncontrolled   Chronic diastolic CHF (congestive heart failure)    COPD exacerbation The patient is presenting with complains of significantly worsening SOB and hypoxia. Chest x-ray showed no acute events, finding consistent with chronic sarcoidosis. Most likely her presentation appears to be secondary to COPD flareup. Started on IV Solu-Medrol, antibiotics. Supportive management with appropriate elevators, mucolytics, antitussives and oxygen.  Acute on chronic respiratory failure with hypoxia Patient has chronic respiratory failure on 3 L of oxygen at home. With above-mentioned symptoms she developed hypoxia with oxygen of 81%, increased oxygen flow to 4 L. This is likely secondary to her COPD exacerbation, continue above management.  Pulmonary hypertension severe Cardiac cath was done recently showed moderate elevation of right heart filling pressure, suggesting R > L heart failure. Supposed to be started on Opsumit, she was supposed to follow with the heart failure clinic on 04/05/2015.  Obesity hypoventilation syndrome. Patient mentions she is using BiPAP daily at bedtime and a regular basis. We will continue the same.  Hyponatremia This is pseudohyponatremia secondary to hyperglycemia, adjusted sodium level to glucose of 505 is 136.  Diabetes mellitus, insulin-dependent, uncontrolled. Last A1c in March 26 was 9.4, patient placed on insulin sliding scale and carbohydrate modified diet. Worsening of CBGs secondary to steroids  Hoarseness of voice Likely upper respiratory tract infection, patient is on IV  antibiotics and steroids.  Severe headache MRI of the brain is negative for acute findings.  Code Status: Full Code Family Communication: Plan discussed with the patient. Disposition Plan: Remains inpatient Diet: Diet heart healthy/carb modified Room service appropriate?: Yes; Fluid consistency:: Thin  Consultants:  None  Procedures:  None  Antibiotics:  Levofloxacin   Objective: Filed Vitals:   04/03/15 0900  BP: 99/51  Pulse: 98  Temp:   Resp: 20    Intake/Output Summary (Last 24 hours) at 04/03/15 1006 Last data filed at 04/03/15 0900  Gross per 24 hour  Intake 406.33 ml  Output   1020 ml  Net -613.67 ml   Filed Weights   04/03/15 0500  Weight: 132.5 kg (292 lb 1.8 oz)    Exam: General: Alert and awake, oriented x3, not in any acute distress. HEENT: anicteric sclera, pupils reactive to light and accommodation, EOMI CVS: S1-S2 clear, no murmur rubs or gallops Chest: clear to auscultation bilaterally, no wheezing, rales or rhonchi Abdomen: soft nontender, nondistended, normal bowel sounds, no organomegaly Extremities: no cyanosis, clubbing or edema noted bilaterally Neuro: Cranial nerves II-XII intact, no focal neurological deficits  Data Reviewed: Basic Metabolic Panel:  Recent Labs Lab 04/02/15 2006 04/03/15 0418  NA 136 129*  K 3.6 4.8  CL 95* 91*  CO2 29 21*  GLUCOSE 205* 505*  BUN 13 19  CREATININE 1.34* 1.55*  CALCIUM 8.9 8.8*   Liver Function Tests: No results for input(s): AST, ALT, ALKPHOS, BILITOT, PROT, ALBUMIN in the last 168 hours. No results for input(s): LIPASE, AMYLASE in the last 168 hours. No results for input(s): AMMONIA in the last 168 hours. CBC:  Recent Labs Lab 04/02/15 2006 04/03/15 0418  WBC 8.5 9.7  NEUTROABS 6.0  --   HGB 13.7 13.4  HCT 42.6 41.9  MCV 78.5 78.5  PLT 143* 145*   Cardiac Enzymes: No results for input(s): CKTOTAL, CKMB, CKMBINDEX, TROPONINI in the last 168 hours. BNP (last 3  results)  Recent Labs  12/13/14 0820 12/18/14 1846 03/19/15 0441  BNP 29.7 231.0* 61.9    ProBNP (last 3 results)  Recent Labs  04/17/14 0120 07/20/14 1817  PROBNP 44.7 39.7    CBG:  Recent Labs Lab 04/03/15 0123 04/03/15 0733  GLUCAP 458* 443*    Micro Recent Results (from the past 240 hour(s))  MRSA PCR Screening     Status: None   Collection Time: 04/03/15  5:13 AM  Result Value Ref Range Status   MRSA by PCR NEGATIVE NEGATIVE Final    Comment:        The GeneXpert MRSA Assay (FDA approved for NASAL specimens only), is one component of a comprehensive MRSA colonization surveillance program. It is not intended to diagnose MRSA infection nor to guide or monitor treatment for MRSA infections.      Studies: Dg Chest 2 View  04/02/2015   CLINICAL DATA:  Patient with shortness of breath. History of pulmonary hypertension.  EXAM: CHEST  2 VIEW  COMPARISON:  Chest radiograph 03/22/2015  FINDINGS: Monitoring leads overlie the patient. Stable cardiac and mediastinal contours. Unchanged bilateral coarse interstitial opacities predominately involving the lower lungs bilaterally. No superimposed consolidative opacity. No pleural effusion or pneumothorax. Regional skeleton is unremarkable.  IMPRESSION: No acute cardiopulmonary process.  Unchanged basilar scarring compatible with sarcoidosis.   Electronically Signed   By: Lovey Newcomer M.D.   On: 04/02/2015 19:14    Scheduled Meds: . ALPRAZolam  1 mg Oral TID  . aspirin EC  81 mg Oral Daily  . enoxaparin (LOVENOX) injection  40 mg Subcutaneous Daily  . insulin aspart  0-5 Units Subcutaneous QHS  . insulin aspart  0-9 Units Subcutaneous TID WC  . insulin glargine  40 Units Subcutaneous BID  . ipratropium-albuterol  3 mL Nebulization Q6H  . loratadine  10 mg Oral QHS  . [START ON 04/05/2015] multivitamin with minerals  1 tablet Oral Q Mon  . omega-3 acid ethyl esters  1 capsule Oral Daily  . pantoprazole  40 mg Oral Daily   . [START ON 04/04/2015] pneumococcal 23 valent vaccine  0.5 mL Intramuscular Tomorrow-1000  . potassium chloride SA  40 mEq Oral BID  . spironolactone  25 mg Oral BID  . torsemide  40 mg Oral BID  . venlafaxine XR  37.5 mg Oral Q breakfast   Continuous Infusions: . sodium chloride 10 mL/hr at 04/03/15 0022       Time spent: 35 minutes    [facility]  [facility] Pager [phone] If 7PM-7AM, please contact night-coverage at [email or web address], password [email or web address] 04/03/2015, 10:06 AM  LOS: 1 day

## 2015-04-03 NOTE — ED Provider Notes (Signed)
CSN: [id number]     Arrival date & time 04/02/15  1824 History   First MD Initiated Contact with Patient 04/02/15 1916     Chief Complaint  Patient presents with  . Respiratory Distress      HPI Patient presents to the emergency department complaining of worsening shortness of breath over the past 24 hours.  Patient has a history of COPD.  She's on 4 L nasal cannula and has increased her his cane lot to 5 L today.  She also has pulmonary hypertension.  She reports she feels her symptoms been worsening over the past several days with definite worsening over the past 24 hours.  She's tried albuterol at home without improvement in her symptoms.  She reports productive cough without fevers or chills.  No unilateral leg swelling.  No history DVT or pulmonary embolism.  She has a home pulse oximeter and reports that when she walks her O2 sats drop into the low 80s.  She states normally they are more around 88-89.  She reports is abnormal for her and is makes her become anxious and makes her feel even worse.   Past Medical History  Diagnosis Date  . Hypertension   . Hyperlipidemia   . Chronic headache   . Fibromyalgia     daily narcotics  . Anxiety     hx chronic BZ use, stopped 07/2010  . Anemia   . Pulmonary sarcoidosis     unimpressive CT chest 2011  . Colonic polyp   . GERD (gastroesophageal reflux disease)   . ALLERGIC RHINITIS   . Asthma   . CHF (congestive heart failure)     Diastolic with fluid overload, May, 2012, LVEF 60%  . Morbid obesity   . Depression   . Panic attacks   . Diabetes mellitus   . COPD (chronic obstructive pulmonary disease)     on home O2, moderate airflow obstruction, suspect d/t emphysema  . Obesity   . Elevated LFTs 09/2011  . Ovarian cyst   . Chronic back pain   . Sleep apnea      CPAP  . Fatty liver   . Esophagitis   . Gastritis   . Internal hemorrhoids   . Adenomatous colon polyp 12/03/09    [facility] in [place], [place]   Past Surgical  History  Procedure Laterality Date  . Polypectomy  2011  . Lumbar microdiscectomy  07/06/2011    R L4-5, stern  . Back surgery    . Carpal tunnel release    . Steroid spinal injections    . Hysteroscopy w/d&c N/A 03/25/2013    Procedure: DILATATION AND CURETTAGE /HYSTEROSCOPY;  Surgeon: [name], MD;  Location: [facility];  Service: Gynecology;  Laterality: N/A;  . Dilation and curettage of uterus    . Cardiac catheterization N/A 03/17/2015    Procedure: Right Heart Cath;  Surgeon: Larey Dresser, MD;  Location: [facility];  Service: Cardiovascular;  Laterality: N/A;   Family History  Problem Relation Age of Onset  . Hypertension Mother   . Heart disease Mother   . Emphysema Father   . Hypertension Father   . Stomach cancer Father   . Heart disease Father   . Allergies Brother   . Stomach cancer Brother   . Congestive Heart Failure Brother   . Diabetes type II Brother   . Heart disease Sister   . Diabetes type II Sister   . Heart attack Sister   . Stroke  Sister   . Hypertension Sister   . Heart failure Sister     diastolic  . Hypertension Sister    History  Substance Use Topics  . Smoking status: Former Smoker -- 0.25 packs/day for 29 years    Types: Cigarettes    Quit date: 11/24/2014  . Smokeless tobacco: Never Used  . Alcohol Use: 0.0 oz/week    0 Standard drinks or equivalent per week     Comment: occasionally   OB History    No data available     Review of Systems  All other systems reviewed and are negative.     Allergies  Simvastatin; Sulfamethoxazole-trimethoprim; Zolpidem tartrate; Azithromycin; Ceftin; Doxycycline; Latex; Metformin and related; Metolazone; Metronidazole; Ciprofloxacin; and Tramadol  Home Medications   Prior to Admission medications   Medication Sig Start Date End Date Taking? Authorizing Provider  albuterol (PROVENTIL) (2.5 MG/3ML) 0.083% nebulizer solution INHALE CONTENTS OF 1 VIAL VIA NEBULIZER EVERY 6 HOURS AS NEEDED  FOR WHEEZING OR SHORTNESS OF BREATH 03/30/15  Yes Rowe Clack, MD  ALPRAZolam Duanne Moron) 1 MG tablet Take 1 tablet (1 mg total) by mouth 3 (three) times daily. 01/25/15  Yes Rowe Clack, MD  aspirin EC 81 MG tablet Take 81 mg by mouth daily.   Yes Historical Provider, MD  BIOTIN PO Take 1 capsule by mouth daily.   Yes Historical Provider, MD  budesonide-formoterol (SYMBICORT) 160-4.5 MCG/ACT inhaler Inhale 2 puffs into the lungs 2 (two) times daily. 04/01/15  Yes Tammy S Parrett, NP  cetirizine (ZYRTEC) 1 MG/ML syrup Take 10 mLs (10 mg total) by mouth at bedtime. 01/06/15  Yes Rowe Clack, MD  diclofenac sodium (VOLTAREN) 1 % GEL Apply 4 g topically 4 (four) times daily. 04/01/15  Yes Rowe Clack, MD  EMBEDA 20-0.8 MG CPCR Take 1 tablet by mouth daily. 03/09/15  Yes Historical Provider, MD  esomeprazole (NEXIUM) 40 MG capsule Take 40 mg by mouth 2 (two) times daily before a meal.   Yes Historical Provider, MD  fluticasone (FLONASE) 50 MCG/ACT nasal spray Place 2 sprays into both nostrils daily. Patient taking differently: Place 2 sprays into both nostrils daily as needed for allergies or rhinitis.  10/19/14  Yes Rowe Clack, MD  gabapentin (NEURONTIN) 300 MG capsule Take 300 mg by mouth daily as needed (neuropathy pain).    Yes Historical Provider, MD  glimepiride (AMARYL) 1 MG tablet Take 1 mg by mouth daily with breakfast.   Yes Historical Provider, MD  hydrOXYzine (ATARAX/VISTARIL) 10 MG tablet TAKE 1 TABLET BY MOUTH THREE TIMES DAILY AS NEEDED FOR ITCHING 08/27/14  Yes Biagio Borg, MD  insulin aspart (NOVOLOG FLEXPEN) 100 UNIT/ML FlexPen Inject 5 Units into the skin 3 (three) times daily with meals. 12/28/14  Yes Shanker Kristeen Mans, MD  Insulin Glargine (LANTUS SOLOSTAR) 100 UNIT/ML Solostar Pen Inject 40 Units into the skin 2 (two) times daily. 01/13/15  Yes Rowe Clack, MD  Multiple Vitamin (MULTIVITAMIN) capsule Take 1 capsule by mouth once a week. Mondays   Yes  Historical Provider, MD  omega-3 fish oil (MAXEPA) 1000 MG CAPS capsule Take 1 capsule by mouth daily.   Yes Historical Provider, MD  potassium chloride SA (K-DUR,KLOR-CON) 20 MEQ tablet Take 2 tablets (40 mEq total) by mouth 2 (two) times daily. 03/21/15  Yes Verlee Monte, MD  PROAIR HFA 108 (90 BASE) MCG/ACT inhaler INHALE 2 PUFFS BY MOUTH EVERY 6 HOURS AS NEEDED FOR WHEEZING OR SHORTNESS OF BREATH 03/30/15  Yes Rowe Clack, MD  spironolactone (ALDACTONE) 25 MG tablet TAKE ONE TABLET BY MOUTH TWICE DAILY 03/30/15  Yes Rowe Clack, MD  tiotropium (SPIRIVA HANDIHALER) 18 MCG inhalation capsule Place 1 capsule (18 mcg total) into inhaler and inhale daily. 04/01/15  Yes Tammy S Parrett, NP  tiZANidine (ZANAFLEX) 2 MG tablet Take 2 mg by mouth 2 (two) times daily as needed for muscle spasms.  03/10/15  Yes Historical Provider, MD  torsemide (DEMADEX) 20 MG tablet Take 2 tablets (40 mg total) by mouth 2 (two) times daily. 03/21/15  Yes Verlee Monte, MD  varenicline (CHANTIX) 0.5 MG tablet Take 1 tablet (0.5 mg total) by mouth 2 (two) times daily. 02/08/15  Yes Rowe Clack, MD  venlafaxine XR (EFFEXOR XR) 37.5 MG 24 hr capsule Take 1 capsule (37.5 mg total) by mouth daily with breakfast. 11/13/14  Yes Rowe Clack, MD   BP 133/60 mmHg  Pulse 107  Temp(Src) 98.5 F (36.9 C) (Oral)  Resp 19  SpO2 95%  LMP 10/16/2012 Physical Exam  Constitutional: She is oriented to person, place, and time. She appears well-developed and well-nourished. No distress.  HENT:  Head: Normocephalic and atraumatic.  Eyes: EOM are normal.  Neck: Normal range of motion.  Cardiovascular: Normal rate, regular rhythm and normal heart sounds.   Pulmonary/Chest: Effort normal.  Tachypnea without retractions.  Wheezing bilaterally  Abdominal: Soft. She exhibits no distension. There is no tenderness.  Musculoskeletal: Normal range of motion.  Neurological: She is alert and oriented to person, place, and time.   Skin: Skin is warm and dry.  Psychiatric: She has a normal mood and affect. Judgment normal.  Nursing note and vitals reviewed.   ED Course  Procedures (including critical care time) Labs Review Labs Reviewed  CBC WITH DIFFERENTIAL/PLATELET - Abnormal; Notable for the following:    RBC 5.43 (*)    MCH 25.2 (*)    RDW 17.0 (*)    Platelets 143 (*)    All other components within normal limits  BASIC METABOLIC PANEL - Abnormal; Notable for the following:    Chloride 95 (*)    Glucose, Bld 205 (*)    Creatinine, Ser 1.34 (*)    GFR calc non Af Amer 46 (*)    GFR calc Af Amer 53 (*)    All other components within normal limits  BASIC METABOLIC PANEL  CBC    Imaging Review Dg Chest 2 View  04/02/2015   CLINICAL DATA:  Patient with shortness of breath. History of pulmonary hypertension.  EXAM: CHEST  2 VIEW  COMPARISON:  Chest radiograph 03/22/2015  FINDINGS: Monitoring leads overlie the patient. Stable cardiac and mediastinal contours. Unchanged bilateral coarse interstitial opacities predominately involving the lower lungs bilaterally. No superimposed consolidative opacity. No pleural effusion or pneumothorax. Regional skeleton is unremarkable.  IMPRESSION: No acute cardiopulmonary process.  Unchanged basilar scarring compatible with sarcoidosis.   Electronically Signed   By: Lovey Newcomer M.D.   On: 04/02/2015 19:14  I personally reviewed the imaging tests through PACS system I reviewed available ER/hospitalization records through the EMR    EKG Interpretation   Date/Time:  Friday April 02 2015 18:30:04 EDT Ventricular Rate:  108 PR Interval:  136 QRS Duration: 84 QT Interval:  343 QTC Calculation: 460 R Axis:   27 Text Interpretation:  Sinus tachycardia Baseline wander in lead(s) II aVR  No significant change was found Confirmed by Shiane Wenberg  MD, Charnel Giles ([id number]) on  04/02/2015 7:25:34 PM  MDM   Final diagnoses:  COPD exacerbation  On home oxygen therapy    Patient is  feeling somewhat better after albuterol and Atrovent however when she ambulates she continues to fall to 81%.  Patient feels uncomfortable going home.  I do not think it is unreasonable to admit the patient overnight for ongoing bronchodilators.  Patient will be admitted.    Jola Schmidt, MD 04/03/15 [phone]

## 2015-04-03 NOTE — ED Notes (Signed)
Patient is resting comfortably. 

## 2015-04-04 DIAGNOSIS — E662 Morbid (severe) obesity with alveolar hypoventilation: Secondary | ICD-10-CM | POA: Diagnosis not present

## 2015-04-04 DIAGNOSIS — J069 Acute upper respiratory infection, unspecified: Secondary | ICD-10-CM | POA: Diagnosis not present

## 2015-04-04 DIAGNOSIS — Z7982 Long term (current) use of aspirin: Secondary | ICD-10-CM | POA: Diagnosis not present

## 2015-04-04 DIAGNOSIS — Z7951 Long term (current) use of inhaled steroids: Secondary | ICD-10-CM | POA: Diagnosis not present

## 2015-04-04 DIAGNOSIS — M797 Fibromyalgia: Secondary | ICD-10-CM | POA: Diagnosis not present

## 2015-04-04 DIAGNOSIS — E1165 Type 2 diabetes mellitus with hyperglycemia: Secondary | ICD-10-CM | POA: Diagnosis not present

## 2015-04-04 DIAGNOSIS — N179 Acute kidney failure, unspecified: Secondary | ICD-10-CM | POA: Diagnosis not present

## 2015-04-04 DIAGNOSIS — E785 Hyperlipidemia, unspecified: Secondary | ICD-10-CM | POA: Diagnosis not present

## 2015-04-04 DIAGNOSIS — I129 Hypertensive chronic kidney disease with stage 1 through stage 4 chronic kidney disease, or unspecified chronic kidney disease: Secondary | ICD-10-CM | POA: Diagnosis not present

## 2015-04-04 DIAGNOSIS — I272 Other secondary pulmonary hypertension: Secondary | ICD-10-CM | POA: Diagnosis not present

## 2015-04-04 DIAGNOSIS — J441 Chronic obstructive pulmonary disease with (acute) exacerbation: Secondary | ICD-10-CM | POA: Diagnosis not present

## 2015-04-04 DIAGNOSIS — T380X5A Adverse effect of glucocorticoids and synthetic analogues, initial encounter: Secondary | ICD-10-CM | POA: Diagnosis not present

## 2015-04-04 DIAGNOSIS — I5032 Chronic diastolic (congestive) heart failure: Secondary | ICD-10-CM | POA: Diagnosis not present

## 2015-04-04 DIAGNOSIS — Z87891 Personal history of nicotine dependence: Secondary | ICD-10-CM | POA: Diagnosis not present

## 2015-04-04 DIAGNOSIS — N182 Chronic kidney disease, stage 2 (mild): Secondary | ICD-10-CM | POA: Diagnosis not present

## 2015-04-04 DIAGNOSIS — D860 Sarcoidosis of lung: Secondary | ICD-10-CM | POA: Diagnosis not present

## 2015-04-04 DIAGNOSIS — Z794 Long term (current) use of insulin: Secondary | ICD-10-CM | POA: Diagnosis not present

## 2015-04-04 DIAGNOSIS — K219 Gastro-esophageal reflux disease without esophagitis: Secondary | ICD-10-CM | POA: Diagnosis not present

## 2015-04-04 DIAGNOSIS — I5033 Acute on chronic diastolic (congestive) heart failure: Secondary | ICD-10-CM | POA: Diagnosis not present

## 2015-04-04 DIAGNOSIS — Z6841 Body Mass Index (BMI) 40.0 and over, adult: Secondary | ICD-10-CM | POA: Diagnosis not present

## 2015-04-04 DIAGNOSIS — J9621 Acute and chronic respiratory failure with hypoxia: Secondary | ICD-10-CM | POA: Diagnosis not present

## 2015-04-04 DIAGNOSIS — F329 Major depressive disorder, single episode, unspecified: Secondary | ICD-10-CM | POA: Diagnosis not present

## 2015-04-04 DIAGNOSIS — E871 Hypo-osmolality and hyponatremia: Secondary | ICD-10-CM | POA: Diagnosis not present

## 2015-04-04 DIAGNOSIS — I10 Essential (primary) hypertension: Secondary | ICD-10-CM | POA: Diagnosis not present

## 2015-04-04 DIAGNOSIS — E875 Hyperkalemia: Secondary | ICD-10-CM | POA: Diagnosis not present

## 2015-04-04 LAB — BASIC METABOLIC PANEL
ANION GAP: 10 (ref 5–15)
Anion gap: 10 (ref 5–15)
BUN: 20 mg/dL (ref 6–20)
BUN: 20 mg/dL (ref 6–20)
CHLORIDE: 91 mmol/L — AB (ref 101–111)
CHLORIDE: 90 mmol/L — AB (ref 101–111)
CO2: 27 mmol/L (ref 22–32)
CO2: 27 mmol/L (ref 22–32)
CREATININE: 1.22 mg/dL — AB (ref 0.44–1.00)
Calcium: 9.4 mg/dL (ref 8.9–10.3)
Calcium: 9 mg/dL (ref 8.9–10.3)
Creatinine, Ser: 1.23 mg/dL — ABNORMAL HIGH (ref 0.44–1.00)
GFR calc Af Amer: 60 mL/min — ABNORMAL LOW (ref >60)
GFR calc non Af Amer: 51 mL/min — ABNORMAL LOW (ref >60)
GFR calc non Af Amer: 52 mL/min — ABNORMAL LOW (ref >60)
GFR, EST AFRICAN AMERICAN: 59 mL/min — AB (ref >60)
Glucose, Bld: 420 mg/dL — ABNORMAL HIGH (ref 65–99)
Glucose, Bld: 530 mg/dL — ABNORMAL HIGH (ref 65–99)
POTASSIUM: 6.5 mmol/L — AB (ref 3.5–5.1)
Potassium: 5.9 mmol/L — ABNORMAL HIGH (ref 3.5–5.1)
SODIUM: 127 mmol/L — AB (ref 135–145)
Sodium: 128 mmol/L — ABNORMAL LOW (ref 135–145)

## 2015-04-04 LAB — CBC
HCT: 39.3 % (ref 36.0–46.0)
Hemoglobin: 12.6 g/dL (ref 12.0–15.0)
MCH: 25.1 pg — ABNORMAL LOW (ref 26.0–34.0)
MCHC: 32.1 g/dL (ref 30.0–36.0)
MCV: 78.4 fL (ref 78.0–100.0)
PLATELETS: 141 10*3/uL — AB (ref 150–400)
RBC: 5.01 MIL/uL (ref 3.87–5.11)
RDW: 17 % — ABNORMAL HIGH (ref 11.5–15.5)
WBC: 14.9 10*3/uL — AB (ref 4.0–10.5)

## 2015-04-04 LAB — GLUCOSE, CAPILLARY
Glucose-Capillary: 489 mg/dL — ABNORMAL HIGH (ref 65–99)
Glucose-Capillary: 419 mg/dL — ABNORMAL HIGH (ref 65–99)
Glucose-Capillary: 366 mg/dL — ABNORMAL HIGH (ref 65–99)
Glucose-Capillary: 342 mg/dL — ABNORMAL HIGH (ref 65–99)

## 2015-04-04 LAB — MAGNESIUM: Magnesium: 2.7 mg/dL — ABNORMAL HIGH (ref 1.7–2.4)

## 2015-04-04 MED ORDER — ENOXAPARIN SODIUM 60 MG/0.6ML ~~LOC~~ SOLN
60.0000 mg | SUBCUTANEOUS | Status: DC
  Administered 2015-04-05 – 2015-04-11 (×7): 60 mg via SUBCUTANEOUS
  Filled 2015-04-04 (×7): qty 0.6

## 2015-04-04 MED ORDER — SODIUM CHLORIDE 0.9 % IV SOLN
1.0000 g | Freq: Once | INTRAVENOUS | Status: AC
  Administered 2015-04-04: 1 g via INTRAVENOUS
  Filled 2015-04-04: qty 10

## 2015-04-04 MED ORDER — PREDNISONE 50 MG PO TABS
50.0000 mg | ORAL_TABLET | Freq: Every day | ORAL | Status: DC
  Administered 2015-04-05 – 2015-04-07 (×3): 50 mg via ORAL
  Filled 2015-04-04 (×4): qty 1

## 2015-04-04 MED ORDER — INSULIN GLARGINE 100 UNIT/ML ~~LOC~~ SOLN
50.0000 [IU] | Freq: Two times a day (BID) | SUBCUTANEOUS | Status: DC
  Administered 2015-04-04 – 2015-04-11 (×15): 50 [IU] via SUBCUTANEOUS
  Filled 2015-04-04 (×17): qty 0.5

## 2015-04-04 MED ORDER — SODIUM POLYSTYRENE SULFONATE 15 GM/60ML PO SUSP
15.0000 g | Freq: Once | ORAL | Status: DC
  Filled 2015-04-04: qty 60

## 2015-04-04 MED ORDER — SODIUM POLYSTYRENE SULFONATE 15 GM/60ML PO SUSP
30.0000 g | Freq: Once | ORAL | Status: AC
  Administered 2015-04-04: 30 g via ORAL
  Filled 2015-04-04: qty 120

## 2015-04-04 MED ORDER — IPRATROPIUM-ALBUTEROL 0.5-2.5 (3) MG/3ML IN SOLN
3.0000 mL | RESPIRATORY_TRACT | Status: DC | PRN
  Administered 2015-04-05: 3 mL via RESPIRATORY_TRACT
  Filled 2015-04-04: qty 3

## 2015-04-04 NOTE — Progress Notes (Signed)
Dr. Micah Noel notified of pt with CBG 489, Dr Herschell Dimes stated to give 20 units of novolog and he would increase the lantus to 50 units. He also stated to hold the 15 grams of kayexelate due at 07:15 since patient received 30 grams at 03:30 and to adjust next B Met to 10:00. Patient updated on plan. Will continue to monitor.

## 2015-04-04 NOTE — Progress Notes (Signed)
[facility] PROGRESS NOTE   [name]:[id number] DOB: 03-13-1966 DOA: 04/02/2015 PCP: Gwendolyn Grant, MD  HPI/Subjective: Hoarseness of voice, but less wheezing and cough. Still complains about SOB, start to cough up some sputum. Blood sugar is elevated  Assessment/Plan: Principal Problem:   COPD exacerbation Active Problems:   PULMONARY SARCOIDOSIS   Essential hypertension   Obstructive sleep apnea   Diabetes type 2, uncontrolled   Chronic diastolic CHF (congestive heart failure)    COPD exacerbation The patient is presenting with complains of significantly worsening SOB and hypoxia. Chest x-ray showed no acute events, finding consistent with chronic sarcoidosis. Most likely her presentation appears to be secondary to COPD flareup. Started on IV Solu-Medrol, antibiotics. Supportive management with appropriate elevators, mucolytics, antitussives and oxygen. Continue current respiratory management, improving slightly  Acute on chronic respiratory failure with hypoxia Patient has chronic respiratory failure on 3 L of oxygen at home. With above-mentioned symptoms she developed hypoxia with oxygen of 81%, increased oxygen flow to 4 L. This is likely secondary to her COPD exacerbation, continue above management. Try to wean oxygen down to her home dose.  Pulmonary hypertension, severe Cardiac cath was done recently showed moderate elevation of right heart filling pressure, suggesting R > L heart failure. Supposed to be started on Opsumit, she was supposed to follow with the heart failure clinic on 04/05/2015. Continue to hold Demadex for 1 more day.  Obesity hypoventilation syndrome. Patient mentions she is using BiPAP daily at bedtime and a regular basis. We will continue the same.  Hyponatremia This is pseudohyponatremia secondary to hyperglycemia, adjusted sodium level to glucose of 505 is 136.  Diabetes mellitus, insulin-dependent, uncontrolled. Last  A1c in March 26 was 9.4, patient placed on insulin sliding scale and carbohydrate modified diet. Worsening of CBGs secondary to IV steroids, increase Lantus to 50 units twice a day.  Hoarseness of voice Likely upper respiratory tract infection, patient is on IV antibiotics and steroids.  Severe headache MRI of the brain is negative for acute findings.  Hyperkalemia Potassium 6.5 today, even calcium gluconate and 30 mg of Kayexalate, BMP repeated.   Code Status: Full Code Family Communication: Plan discussed with the patient. Disposition Plan: Remains inpatient Diet: Diet heart healthy/carb modified Room service appropriate?: Yes; Fluid consistency:: Thin  Consultants:  None  Procedures:  None  Antibiotics:  Levofloxacin   Objective: Filed Vitals:   04/04/15 0800  BP: 123/59  Pulse: 81  Temp: 97.4 F (36.3 C)  Resp: 21    Intake/Output Summary (Last 24 hours) at 04/04/15 1051 Last data filed at 04/04/15 0900  Gross per 24 hour  Intake   2050 ml  Output   5875 ml  Net  -3825 ml   Filed Weights   04/03/15 0500  Weight: 132.5 kg (292 lb 1.8 oz)    Exam: General: Alert and awake, oriented x3, not in any acute distress. HEENT: anicteric sclera, pupils reactive to light and accommodation, EOMI CVS: S1-S2 clear, no murmur rubs or gallops Chest: clear to auscultation bilaterally, no wheezing, rales or rhonchi Abdomen: soft nontender, nondistended, normal bowel sounds, no organomegaly Extremities: no cyanosis, clubbing or edema noted bilaterally Neuro: Cranial nerves II-XII intact, no focal neurological deficits  Data Reviewed: Basic Metabolic Panel:  Recent Labs Lab 04/02/15 2006 04/03/15 0418 04/04/15 0224  NA 136 129* 128*  K 3.6 4.8 6.5*  CL 95* 91* 91*  CO2 29 21* 27  GLUCOSE 205* 505* 420*  BUN 13 19 20   CREATININE  1.34* 1.55* 1.23*  CALCIUM 8.9 8.8* 9.4  MG  --   --  2.7*   Liver Function Tests: No results for input(s): AST, ALT, ALKPHOS,  BILITOT, PROT, ALBUMIN in the last 168 hours. No results for input(s): LIPASE, AMYLASE in the last 168 hours. No results for input(s): AMMONIA in the last 168 hours. CBC:  Recent Labs Lab 04/02/15 2006 04/03/15 0418 04/04/15 0224  WBC 8.5 9.7 14.9*  NEUTROABS 6.0  --   --   HGB 13.7 13.4 12.6  HCT 42.6 41.9 39.3  MCV 78.5 78.5 78.4  PLT 143* 145* 141*   Cardiac Enzymes: No results for input(s): CKTOTAL, CKMB, CKMBINDEX, TROPONINI in the last 168 hours. BNP (last 3 results)  Recent Labs  12/13/14 0820 12/18/14 1846 03/19/15 0441  BNP 29.7 231.0* 61.9    ProBNP (last 3 results)  Recent Labs  04/17/14 0120 07/20/14 1817  PROBNP 44.7 39.7    CBG:  Recent Labs Lab 04/03/15 0733 04/03/15 1153 04/03/15 1659 04/03/15 2149 04/04/15 0756  GLUCAP 443* 396* 339* 380* 489*    Micro Recent Results (from the past 240 hour(s))  MRSA PCR Screening     Status: None   Collection Time: 04/03/15  5:13 AM  Result Value Ref Range Status   MRSA by PCR NEGATIVE NEGATIVE Final    Comment:        The GeneXpert MRSA Assay (FDA approved for NASAL specimens only), is one component of a comprehensive MRSA colonization surveillance program. It is not intended to diagnose MRSA infection nor to guide or monitor treatment for MRSA infections.      Studies: Dg Chest 2 View  04/02/2015   CLINICAL DATA:  Patient with shortness of breath. History of pulmonary hypertension.  EXAM: CHEST  2 VIEW  COMPARISON:  Chest radiograph 03/22/2015  FINDINGS: Monitoring leads overlie the patient. Stable cardiac and mediastinal contours. Unchanged bilateral coarse interstitial opacities predominately involving the lower lungs bilaterally. No superimposed consolidative opacity. No pleural effusion or pneumothorax. Regional skeleton is unremarkable.  IMPRESSION: No acute cardiopulmonary process.  Unchanged basilar scarring compatible with sarcoidosis.   Electronically Signed   By: Lovey Newcomer M.D.    On: 04/02/2015 19:14    Scheduled Meds: . ALPRAZolam  1 mg Oral TID  . aspirin EC  81 mg Oral Daily  . enoxaparin (LOVENOX) injection  40 mg Subcutaneous Daily  . insulin aspart  0-20 Units Subcutaneous TID WC  . insulin glargine  50 Units Subcutaneous BID  . ipratropium-albuterol  3 mL Nebulization Q6H  . levofloxacin (LEVAQUIN) IV  750 mg Intravenous Q24H  . loratadine  10 mg Oral QHS  . methylPREDNISolone (SOLU-MEDROL) injection  80 mg Intravenous 3 times per day  . [START ON 04/05/2015] multivitamin with minerals  1 tablet Oral Q Mon  . omega-3 acid ethyl esters  1 capsule Oral Daily  . pantoprazole  40 mg Oral Daily  . sodium polystyrene  15 g Oral Once  . venlafaxine XR  37.5 mg Oral Q breakfast   Continuous Infusions: . sodium chloride 10 mL/hr at 04/03/15 1900       Time spent: 35 minutes    [facility]  [facility] Pager [phone] If 7PM-7AM, please contact night-coverage at [email or web address], password [facility] 04/04/2015, 10:51 AM  LOS: 2 days

## 2015-04-04 NOTE — Progress Notes (Signed)
Patient states that she will place CPAP on herself later on tonight. Filled humidifier chamber with sterile water and verified her settings. RT will continue to monitor.

## 2015-04-04 NOTE — Progress Notes (Signed)
Dr Lajean Manes notified of CBG 419, orders given to give novolog 20 units based on that CBG.

## 2015-04-04 NOTE — Progress Notes (Signed)
   04/04/15 1800  Clinical Encounter Type  Visited With Family  Visit Type Spiritual support;Social support;Follow-up  Referral From Nurse;Chaplain  Spiritual Encounters  Spiritual Needs Emotional;Prayer  Stress Factors  Patient Stress Factors Exhausted;Health changes  Ch respond to consult with PT; [facility] previously met with PT on prior admission; PT is frustrated with still being sick; PT does not appear to be suicidal based on conversation with [facility]; PT hopeful and making positive life changes per conversation.

## 2015-04-05 ENCOUNTER — Inpatient Hospital Stay: Payer: Self-pay | Admitting: Internal Medicine

## 2015-04-05 ENCOUNTER — Telehealth (HOSPITAL_COMMUNITY): Payer: Self-pay

## 2015-04-05 ENCOUNTER — Encounter (HOSPITAL_COMMUNITY): Payer: Self-pay

## 2015-04-05 LAB — BASIC METABOLIC PANEL
ANION GAP: 10 (ref 5–15)
BUN: 25 mg/dL — AB (ref 6–20)
CHLORIDE: 92 mmol/L — AB (ref 101–111)
CO2: 29 mmol/L (ref 22–32)
CREATININE: 1.18 mg/dL — AB (ref 0.44–1.00)
Calcium: 9.2 mg/dL (ref 8.9–10.3)
GFR calc Af Amer: >60 mL/min (ref >60)
GFR, EST NON AFRICAN AMERICAN: 54 mL/min — AB (ref >60)
GLUCOSE: 310 mg/dL — AB (ref 65–99)
Potassium: 4.5 mmol/L (ref 3.5–5.1)
Sodium: 131 mmol/L — ABNORMAL LOW (ref 135–145)

## 2015-04-05 LAB — HEMOGLOBIN A1C
Hgb A1c MFr Bld: 9.3 % — ABNORMAL HIGH (ref 4.8–5.6)
Mean Plasma Glucose: 220 mg/dL

## 2015-04-05 LAB — GLUCOSE, CAPILLARY
GLUCOSE-CAPILLARY: 445 mg/dL — AB (ref 65–99)
Glucose-Capillary: 334 mg/dL — ABNORMAL HIGH (ref 65–99)
Glucose-Capillary: 358 mg/dL — ABNORMAL HIGH (ref 65–99)
Glucose-Capillary: 375 mg/dL — ABNORMAL HIGH (ref 65–99)

## 2015-04-05 LAB — CBC
HEMATOCRIT: 40.8 % (ref 36.0–46.0)
HEMOGLOBIN: 12.9 g/dL (ref 12.0–15.0)
MCH: 25.2 pg — ABNORMAL LOW (ref 26.0–34.0)
MCHC: 31.6 g/dL (ref 30.0–36.0)
MCV: 79.7 fL (ref 78.0–100.0)
Platelets: 138 10*3/uL — ABNORMAL LOW (ref 150–400)
RBC: 5.12 MIL/uL — ABNORMAL HIGH (ref 3.87–5.11)
RDW: 17.1 % — ABNORMAL HIGH (ref 11.5–15.5)
WBC: 9.3 10*3/uL (ref 4.0–10.5)

## 2015-04-05 MED ORDER — IPRATROPIUM-ALBUTEROL 0.5-2.5 (3) MG/3ML IN SOLN
3.0000 mL | Freq: Three times a day (TID) | RESPIRATORY_TRACT | Status: DC
  Administered 2015-04-06 – 2015-04-11 (×15): 3 mL via RESPIRATORY_TRACT
  Filled 2015-04-05 (×17): qty 3

## 2015-04-05 MED ORDER — INSULIN ASPART 100 UNIT/ML ~~LOC~~ SOLN
10.0000 [IU] | Freq: Once | SUBCUTANEOUS | Status: AC
  Administered 2015-04-05: 10 [IU] via SUBCUTANEOUS

## 2015-04-05 MED ORDER — GUAIFENESIN ER 600 MG PO TB12
1200.0000 mg | ORAL_TABLET | Freq: Two times a day (BID) | ORAL | Status: DC
  Administered 2015-04-05 – 2015-04-11 (×13): 1200 mg via ORAL
  Filled 2015-04-05 (×14): qty 2

## 2015-04-05 MED ORDER — SPIRONOLACTONE 25 MG PO TABS
25.0000 mg | ORAL_TABLET | Freq: Two times a day (BID) | ORAL | Status: DC
  Administered 2015-04-05 – 2015-04-11 (×12): 25 mg via ORAL
  Filled 2015-04-05 (×14): qty 1

## 2015-04-05 MED ORDER — INSULIN ASPART 100 UNIT/ML ~~LOC~~ SOLN
0.0000 [IU] | Freq: Three times a day (TID) | SUBCUTANEOUS | Status: DC
  Administered 2015-04-05: 20 [IU] via SUBCUTANEOUS
  Administered 2015-04-05: 15 [IU] via SUBCUTANEOUS
  Administered 2015-04-05 – 2015-04-06 (×2): 20 [IU] via SUBCUTANEOUS
  Administered 2015-04-06 (×2): 4 [IU] via SUBCUTANEOUS
  Administered 2015-04-07: 7 [IU] via SUBCUTANEOUS
  Administered 2015-04-07: 20 [IU] via SUBCUTANEOUS
  Administered 2015-04-07 – 2015-04-08 (×3): 7 [IU] via SUBCUTANEOUS
  Administered 2015-04-08: 15 [IU] via SUBCUTANEOUS
  Administered 2015-04-09 (×2): 4 [IU] via SUBCUTANEOUS
  Administered 2015-04-09: 15 [IU] via SUBCUTANEOUS
  Administered 2015-04-10 (×2): 4 [IU] via SUBCUTANEOUS
  Administered 2015-04-10: 7 [IU] via SUBCUTANEOUS
  Administered 2015-04-11: 4 [IU] via SUBCUTANEOUS

## 2015-04-05 MED ORDER — TORSEMIDE 20 MG PO TABS
40.0000 mg | ORAL_TABLET | Freq: Two times a day (BID) | ORAL | Status: DC
  Administered 2015-04-05 – 2015-04-06 (×2): 40 mg via ORAL
  Filled 2015-04-05 (×4): qty 2

## 2015-04-05 MED ORDER — MENTHOL 3 MG MT LOZG
1.0000 | LOZENGE | OROMUCOSAL | Status: DC | PRN
  Filled 2015-04-05: qty 9

## 2015-04-05 MED ORDER — GUAIFENESIN-DM 100-10 MG/5ML PO SYRP
5.0000 mL | ORAL_SOLUTION | ORAL | Status: DC | PRN
  Administered 2015-04-05 – 2015-04-07 (×4): 5 mL via ORAL
  Filled 2015-04-05 (×4): qty 5

## 2015-04-05 NOTE — Progress Notes (Signed)
Patient resting comfortably on CPAP with 5L O2 bled in. RT will continue to monitor.

## 2015-04-05 NOTE — Consult Note (Signed)
   [facility] CM Inpatient Consult   04/05/2015  [name] 1965-10-07 [id number] Patient is  active [prior to [place] with [facility] for chronic disease management services.  Patient has been engaged by a [employer] and LCSW.  Our community based plan of care has focused on disease management and community resource support.  Patient states she has received assistance from [facility] but she doesn't think,  there are anything else she needed to work on with [facility] as far as that she has her transportation with [employer]  and she feels like she has met those goals.  She states she keeps in contact with her [place], [place].  Patient was pleasant but short with this Probation officer. Notified [place], Care Manager nurse of the patient's admission. Patient will receive a post discharge transition of care call and will be evaluated for monthly home visits for assessments and disease process education.  Made Inpatient Case Manager aware that [facility] following. Of note, [facility] Care Management services does not replace or interfere with any services that are arranged by inpatient case management or social work.  For additional questions or referrals please contact: Natividad Brood, RN BSN [facility] Liaison  [phone] business mobile phone

## 2015-04-05 NOTE — Telephone Encounter (Signed)
I have called and left a message with Catelyn to inquire about participation in Pulmonary Rehab per Dr. Claris Gladden referral. Requested patient return call. Will follow up.

## 2015-04-05 NOTE — Patient Instructions (Addendum)
Telephone call made to Natividad Brood to clarify referral. [place] advised this patient is currently in the acute care setting.  [place] also advised that patient is interested in Community Case Management.  Plan: This RNCM will attempt to engage patient once she is discharged from the acute care setting.   Will assist patient with referral to community psychiatrist.

## 2015-04-05 NOTE — Progress Notes (Signed)
Witnessed Pricilla Kuffour return pt belongings from safe to the pt room.

## 2015-04-05 NOTE — Progress Notes (Addendum)
Spoke with pt regarding nocturnal cpap.  Pt stated that she places it on herself when ready for bed and feels comfortable doing so.  Pt declines assistance with it, but encouraged her to call should she change her mind.  Sterile water added to humidifier.  RT will monitor and assess as needed.

## 2015-04-05 NOTE — Care Management (Signed)
Important Message  Patient Details  Name: [name] MRN: [id number] Date of Birth: 09/21/1966   Medicare Important Message Given:  Yes-second notification given    Nathen May 04/05/2015, 2:28 PM

## 2015-04-05 NOTE — Care Management Note (Signed)
Case Management Note  Patient Details  Name: [name] MRN: [id number] Date of Birth: 06-08-1966  Subjective/Objective:     Admitted with COPD               Action/Plan: Patient recently discharged, has [place] with [facility] as prior to admission; CM will continue to follow for DCP  Expected Discharge Date:   04/08/2015               Expected Discharge Plan:  [place]  Discharge planning Services  CM Consult     Choice offered to:  Patient    HH Arranged:  Disease Management [place] Agency:   [facility]  Status of Service:  In process, will continue to follow  Medicare Important Message Given:  [facility] notification given  Sherrilyn Rist [phone] 04/05/2015, 2:37 PM

## 2015-04-05 NOTE — Progress Notes (Signed)
[facility] PROGRESS NOTE   [name] YOV:[id number] DOB: 1966/01/27 DOA: 04/02/2015 PCP: Gwendolyn Grant, MD  HPI/Subjective: Single complaining about the hoarseness of voice, less wheezing and cough. Added Cepacol lozenges for sore throat.  Assessment/Plan: Principal Problem:   COPD exacerbation Active Problems:   PULMONARY SARCOIDOSIS   Essential hypertension   Obstructive sleep apnea   Diabetes type 2, uncontrolled   Chronic diastolic CHF (congestive heart failure)    COPD exacerbation The patient is presenting with complains of significantly worsening SOB and hypoxia. Chest x-ray showed no acute events, finding consistent with chronic sarcoidosis. Most likely her presentation appears to be secondary to COPD flareup. Started on IV Solu-Medrol, antibiotics. Supportive management with appropriate elevators, mucolytics, antitussives and oxygen. IV Solu-Medrol switched to oral prednisone, continue rest of the respiratory management.  Acute on chronic respiratory failure with hypoxia Patient has chronic respiratory failure on 3 L of oxygen at home. With above-mentioned symptoms she developed hypoxia with oxygen of 81%, increased oxygen flow to 4 L. This is likely secondary to her COPD exacerbation, continue above management. Try to wean oxygen down to her home dose.  Pulmonary hypertension, severe Cardiac cath was done recently showed moderate elevation of right heart filling pressure, suggesting R > L heart failure. Supposed to be started on Opsumit, she was supposed to follow with the heart failure clinic on 04/05/2015. Restart diuretics.  Obesity hypoventilation syndrome. Patient mentions she is using BiPAP daily at bedtime and a regular basis. We will continue the same.  Hyponatremia This is pseudohyponatremia secondary to hyperglycemia, adjusted sodium level to glucose of 505 is 136.  Diabetes mellitus, insulin-dependent, uncontrolled. Last A1c in  March 26 was 9.4, patient placed on insulin sliding scale and carbohydrate modified diet. Worsening of CBGs secondary to IV steroids, increase Lantus to 50 units twice a day.  Hoarseness of voice Likely upper respiratory tract infection, patient is on IV antibiotics and steroids.  Hyperkalemia Potassium 6.5 today, even calcium gluconate and 30 mg of Kayexalate, improved to 4.5.   Code Status: Full Code Family Communication: Plan discussed with the patient. Disposition Plan: Remains inpatient Diet: Diet heart healthy/carb modified Room service appropriate?: Yes; Fluid consistency:: Thin  Consultants:  None  Procedures:  None  Antibiotics:  Levofloxacin   Objective: Filed Vitals:   04/05/15 0616  BP: 144/67  Pulse: 85  Temp: 97.5 F (36.4 C)  Resp: 20    Intake/Output Summary (Last 24 hours) at 04/05/15 1128 Last data filed at 04/05/15 0846  Gross per 24 hour  Intake   1520 ml  Output   3400 ml  Net  -1880 ml   Filed Weights   04/03/15 0500 04/04/15 1544 04/05/15 0616  Weight: 132.5 kg (292 lb 1.8 oz) 134.31 kg (296 lb 1.6 oz) 134.673 kg (296 lb 14.4 oz)    Exam: General: Alert and awake, oriented x3, not in any acute distress. HEENT: anicteric sclera, pupils reactive to light and accommodation, EOMI CVS: S1-S2 clear, no murmur rubs or gallops Chest: clear to auscultation bilaterally, no wheezing, rales or rhonchi Abdomen: soft nontender, nondistended, normal bowel sounds, no organomegaly Extremities: no cyanosis, clubbing or edema noted bilaterally Neuro: Cranial nerves II-XII intact, no focal neurological deficits  Data Reviewed: Basic Metabolic Panel:  Recent Labs Lab 04/02/15 2006 04/03/15 0418 04/04/15 0224 04/04/15 1027 04/05/15 0350  NA 136 129* 128* 127* 131*  K 3.6 4.8 6.5* 5.9* 4.5  CL 95* 91* 91* 90* 92*  CO2 29 21* 27 27 29  GLUCOSE 205* 505* 420* 530* 310*  BUN 13 19 20 20  25*  CREATININE 1.34* 1.55* 1.23* 1.22* 1.18*  CALCIUM 8.9  8.8* 9.4 9.0 9.2  MG  --   --  2.7*  --   --    Liver Function Tests: No results for input(s): AST, ALT, ALKPHOS, BILITOT, PROT, ALBUMIN in the last 168 hours. No results for input(s): LIPASE, AMYLASE in the last 168 hours. No results for input(s): AMMONIA in the last 168 hours. CBC:  Recent Labs Lab 04/02/15 2006 04/03/15 0418 04/04/15 0224 04/05/15 0350  WBC 8.5 9.7 14.9* 9.3  NEUTROABS 6.0  --   --   --   HGB 13.7 13.4 12.6 12.9  HCT 42.6 41.9 39.3 40.8  MCV 78.5 78.5 78.4 79.7  PLT 143* 145* 141* 138*   Cardiac Enzymes: No results for input(s): CKTOTAL, CKMB, CKMBINDEX, TROPONINI in the last 168 hours. BNP (last 3 results)  Recent Labs  12/13/14 0820 12/18/14 1846 03/19/15 0441  BNP 29.7 231.0* 61.9    ProBNP (last 3 results)  Recent Labs  04/17/14 0120 07/20/14 1817  PROBNP 44.7 39.7    CBG:  Recent Labs Lab 04/04/15 1148 04/04/15 1624 04/04/15 2152 04/05/15 0623 04/05/15 1110  GLUCAP 419* 366* 342* 334* 358*    Micro Recent Results (from the past 240 hour(s))  MRSA PCR Screening     Status: None   Collection Time: 04/03/15  5:13 AM  Result Value Ref Range Status   MRSA by PCR NEGATIVE NEGATIVE Final    Comment:        The GeneXpert MRSA Assay (FDA approved for NASAL specimens only), is one component of a comprehensive MRSA colonization surveillance program. It is not intended to diagnose MRSA infection nor to guide or monitor treatment for MRSA infections.      Studies: No results found.  Scheduled Meds: . ALPRAZolam  1 mg Oral TID  . aspirin EC  81 mg Oral Daily  . enoxaparin (LOVENOX) injection  60 mg Subcutaneous Q24H  . guaiFENesin  1,200 mg Oral BID  . insulin aspart  0-20 Units Subcutaneous TID WC  . insulin glargine  50 Units Subcutaneous BID  . ipratropium-albuterol  3 mL Nebulization Q6H  . levofloxacin (LEVAQUIN) IV  750 mg Intravenous Q24H  . loratadine  10 mg Oral QHS  . multivitamin with minerals  1 tablet Oral  Q Mon  . omega-3 acid ethyl esters  1 capsule Oral Daily  . pantoprazole  40 mg Oral Daily  . predniSONE  50 mg Oral Q breakfast  . sodium polystyrene  15 g Oral Once  . venlafaxine XR  37.5 mg Oral Q breakfast   Continuous Infusions:       Time spent: 35 minutes    [facility] A  [facility] Pager [phone] If 7PM-7AM, please contact night-coverage at [email or web address], password [facility] 04/05/2015, 11:28 AM  LOS: 3 days

## 2015-04-06 ENCOUNTER — Encounter (HOSPITAL_COMMUNITY): Payer: Self-pay | Admitting: Cardiology

## 2015-04-06 DIAGNOSIS — I5033 Acute on chronic diastolic (congestive) heart failure: Secondary | ICD-10-CM

## 2015-04-06 DIAGNOSIS — I270 Primary pulmonary hypertension: Secondary | ICD-10-CM

## 2015-04-06 DIAGNOSIS — J9611 Chronic respiratory failure with hypoxia: Secondary | ICD-10-CM

## 2015-04-06 LAB — BASIC METABOLIC PANEL
Anion gap: 10 (ref 5–15)
BUN: 23 mg/dL — AB (ref 6–20)
CALCIUM: 9.2 mg/dL (ref 8.9–10.3)
CHLORIDE: 90 mmol/L — AB (ref 101–111)
CO2: 36 mmol/L — AB (ref 22–32)
Creatinine, Ser: 1.05 mg/dL — ABNORMAL HIGH (ref 0.44–1.00)
GFR calc Af Amer: >60 mL/min (ref >60)
GFR calc non Af Amer: >60 mL/min (ref >60)
GLUCOSE: 194 mg/dL — AB (ref 65–99)
Potassium: 3.7 mmol/L (ref 3.5–5.1)
SODIUM: 136 mmol/L (ref 135–145)

## 2015-04-06 LAB — GLUCOSE, CAPILLARY
GLUCOSE-CAPILLARY: 154 mg/dL — AB (ref 65–99)
GLUCOSE-CAPILLARY: 385 mg/dL — AB (ref 65–99)
Glucose-Capillary: 162 mg/dL — ABNORMAL HIGH (ref 65–99)
Glucose-Capillary: 356 mg/dL — ABNORMAL HIGH (ref 65–99)

## 2015-04-06 LAB — CBC
HCT: 39.8 % (ref 36.0–46.0)
HEMOGLOBIN: 12.7 g/dL (ref 12.0–15.0)
MCH: 25 pg — ABNORMAL LOW (ref 26.0–34.0)
MCHC: 31.9 g/dL (ref 30.0–36.0)
MCV: 78.5 fL (ref 78.0–100.0)
Platelets: 160 10*3/uL (ref 150–400)
RBC: 5.07 MIL/uL (ref 3.87–5.11)
RDW: 17.3 % — AB (ref 11.5–15.5)
WBC: 8.1 10*3/uL (ref 4.0–10.5)

## 2015-04-06 MED ORDER — FUROSEMIDE 10 MG/ML IJ SOLN
80.0000 mg | Freq: Two times a day (BID) | INTRAMUSCULAR | Status: DC
  Administered 2015-04-06 – 2015-04-07 (×3): 80 mg via INTRAVENOUS
  Filled 2015-04-06 (×5): qty 8

## 2015-04-06 NOTE — Progress Notes (Signed)
PT refused vitals and daily weight.

## 2015-04-06 NOTE — Progress Notes (Signed)
[facility] PROGRESS NOTE   [name] SWH:[id number] DOB: 17-Apr-1966 DOA: 04/02/2015 PCP: Gwendolyn Grant, MD  HPI/Subjective: Feeling better since yesterday, less hoarseness of her voice and less shortness of breath. Continue current respiratory management.  Assessment/Plan: Principal Problem:   COPD exacerbation Active Problems:   PULMONARY SARCOIDOSIS   Essential hypertension   Obstructive sleep apnea   Diabetes type 2, uncontrolled   Chronic diastolic CHF (congestive heart failure)   Hypoxia   Pulmonary HTN    COPD exacerbation The patient is presenting with complains of significantly worsening SOB and hypoxia. Chest x-ray showed no acute events, finding consistent with chronic sarcoidosis. Most likely her presentation appears to be secondary to COPD flareup. Started on IV Solu-Medrol, antibiotics. Supportive management with appropriate elevators, mucolytics, antitussives and oxygen. IV Solu-Medrol switched to oral prednisone, continue rest of the respiratory management.  Acute on chronic respiratory failure with hypoxia Patient has chronic respiratory failure on 3 L of oxygen at home. With above-mentioned symptoms she developed hypoxia with oxygen of 81%, increased oxygen flow to 4 L. This is likely secondary to her COPD exacerbation, continue above management. Try to wean oxygen down to her home dose.  Pulmonary hypertension, severe Cardiac cath was done recently showed moderate elevation of right heart filling pressure, suggesting R > L heart failure. Supposed to be started on Opsumit, she was supposed to follow with the heart failure clinic on 04/05/2015. Restarted diuretics.  Chronic diastolic CHF Seen by cardiology, not overtly overloaded, she is on oral Demadex. Cardiology decided to give her 1 or 2 days of IV Lasix.  [place] Per cardiology patient be started on [place], pending approval. Likely related to pulmonary sarcoidosis.  Obesity  hypoventilation syndrome. Patient mentions she is using BiPAP daily at bedtime and a regular basis. We will continue the same.  Hyponatremia This is pseudohyponatremia secondary to hyperglycemia, adjusted sodium level to glucose of 505 is 136.  Diabetes mellitus, insulin-dependent, uncontrolled. Last A1c in March 26 was 9.4, patient placed on insulin sliding scale and carbohydrate modified diet. Worsening of CBGs secondary to IV steroids, increase Lantus to 50 units twice a day.  Hoarseness of voice Likely upper respiratory tract infection, patient is on IV antibiotics and steroids.  Hyperkalemia Potassium 6.5 today, even calcium gluconate and 30 mg of Kayexalate, improved to 4.5.   Code Status: Full Code Family Communication: Plan discussed with the patient. Disposition Plan: Remains inpatient Diet: Diet heart healthy/carb modified Room service appropriate?: Yes; Fluid consistency:: Thin  Consultants:  None  Procedures:  None  Antibiotics:  Levofloxacin   Objective: Filed Vitals:   04/06/15 1435  BP: 136/74  Pulse: 94  Temp: 97.9 F (36.6 C)  Resp: 18    Intake/Output Summary (Last 24 hours) at 04/06/15 1636 Last data filed at 04/06/15 1315  Gross per 24 hour  Intake   1140 ml  Output   5600 ml  Net  -4460 ml   Filed Weights   04/05/15 0616  Weight: 134.673 kg (296 lb 14.4 oz)    Exam: General: Alert and awake, oriented x3, not in any acute distress. HEENT: anicteric sclera, pupils reactive to light and accommodation, EOMI CVS: S1-S2 clear, no murmur rubs or gallops Chest: clear to auscultation bilaterally, no wheezing, rales or rhonchi Abdomen: soft nontender, nondistended, normal bowel sounds, no organomegaly Extremities: no cyanosis, clubbing or edema noted bilaterally Neuro: Cranial nerves II-XII intact, no focal neurological deficits  Data Reviewed: Basic Metabolic Panel:  Recent Labs Lab 04/03/15 0418 04/04/15  2725 04/04/15 1027  04/05/15 0350 04/06/15 0315  NA 129* 128* 127* 131* 136  K 4.8 6.5* 5.9* 4.5 3.7  CL 91* 91* 90* 92* 90*  CO2 21* 27 27 29  36*  GLUCOSE 505* 420* 530* 310* 194*  BUN 19 20 20  25* 23*  CREATININE 1.55* 1.23* 1.22* 1.18* 1.05*  CALCIUM 8.8* 9.4 9.0 9.2 9.2  MG  --  2.7*  --   --   --    Liver Function Tests: No results for input(s): AST, ALT, ALKPHOS, BILITOT, PROT, ALBUMIN in the last 168 hours. No results for input(s): LIPASE, AMYLASE in the last 168 hours. No results for input(s): AMMONIA in the last 168 hours. CBC:  Recent Labs Lab 04/02/15 2006 04/03/15 0418 04/04/15 0224 04/05/15 0350 04/06/15 0315  WBC 8.5 9.7 14.9* 9.3 8.1  NEUTROABS 6.0  --   --   --   --   HGB 13.7 13.4 12.6 12.9 12.7  HCT 42.6 41.9 39.3 40.8 39.8  MCV 78.5 78.5 78.4 79.7 78.5  PLT 143* 145* 141* 138* 160   Cardiac Enzymes: No results for input(s): CKTOTAL, CKMB, CKMBINDEX, TROPONINI in the last 168 hours. BNP (last 3 results)  Recent Labs  12/13/14 0820 12/18/14 1846 03/19/15 0441  BNP 29.7 231.0* 61.9    ProBNP (last 3 results)  Recent Labs  04/17/14 0120 07/20/14 1817  PROBNP 44.7 39.7    CBG:  Recent Labs Lab 04/05/15 1649 04/05/15 2040 04/06/15 0643 04/06/15 1105 04/06/15 1558  GLUCAP 375* 445* 162* 154* 356*    Micro Recent Results (from the past 240 hour(s))  MRSA PCR Screening     Status: None   Collection Time: 04/03/15  5:13 AM  Result Value Ref Range Status   MRSA by PCR NEGATIVE NEGATIVE Final    Comment:        The GeneXpert MRSA Assay (FDA approved for NASAL specimens only), is one component of a comprehensive MRSA colonization surveillance program. It is not intended to diagnose MRSA infection nor to guide or monitor treatment for MRSA infections.      Studies: No results found.  Scheduled Meds: . ALPRAZolam  1 mg Oral TID  . aspirin EC  81 mg Oral Daily  . enoxaparin (LOVENOX) injection  60 mg Subcutaneous Q24H  . furosemide  80 mg  Intravenous BID  . guaiFENesin  1,200 mg Oral BID  . insulin aspart  0-20 Units Subcutaneous TID WC  . insulin glargine  50 Units Subcutaneous BID  . ipratropium-albuterol  3 mL Nebulization TID  . levofloxacin (LEVAQUIN) IV  750 mg Intravenous Q24H  . loratadine  10 mg Oral QHS  . multivitamin with minerals  1 tablet Oral Q Mon  . omega-3 acid ethyl esters  1 capsule Oral Daily  . pantoprazole  40 mg Oral Daily  . predniSONE  50 mg Oral Q breakfast  . sodium polystyrene  15 g Oral Once  . spironolactone  25 mg Oral BID  . venlafaxine XR  37.5 mg Oral Q breakfast   Continuous Infusions:       Time spent: 35 minutes    [facility] A  [facility] Pager [phone] If 7PM-7AM, please contact night-coverage at [email or web address], password [facility] 04/06/2015, 4:36 PM  LOS: 4 days

## 2015-04-06 NOTE — Progress Notes (Signed)
Patient stated that she will place her own CPAP on. Filled with sterile water and ensured correct settings were on. RT will continue to monitor.

## 2015-04-06 NOTE — Progress Notes (Signed)
Inpatient Diabetes Program Recommendations  AACE/ADA: New Consensus Statement on Inpatient Glycemic Control (2013)  Target Ranges:  Prepandial:   less than 140 mg/dL      Peak postprandial:   less than 180 mg/dL (1-2 hours)      Critically ill patients:  140 - 180 mg/dL   Prednisone tends to elevate glucose following meals. Inpatient Diabetes Program Recommendations Insulin - Basal: xxxx Correction (SSI): xxxx Insulin - Meal Coverage: While on prednisone, please consider addition of meal coverage of 6 units [id number] to prevent post-prandial hyperglycemia  Thank you Rosita Kea, RN, MSN, CDE  Diabetes Inpatient Program Office: [phone] Pager: [phone] 8:00 am to 5:00 pm

## 2015-04-06 NOTE — Progress Notes (Signed)
Pt refused VS during shift, said she will let us get later.

## 2015-04-06 NOTE — Care Management Note (Signed)
Patient is active with [facility] for SN/PT/OT.  Resumption of care orders requested upon hospital discharge.  [place] [facility] [phone]

## 2015-04-06 NOTE — Consult Note (Signed)
Reason for Consult: severe pulmonary HTN /HF had appt to be seen 04/05/15 for follow up  Referring Physician:  Dr. Hartford Poli  PCP:  Gwendolyn Grant, MD  Primary Cardiologist: Dr. Blair Heys is an 49 y.o. female.    Chief Complaint: admitted for increased SOB on 04/02/15    HPI: [name] is a 49 y.o. with a PMH of OSA on Cpap, Chronic Diastolic CHF, Pulmonary hypertension, Fibromyalgia, Pulmonary sarcoidosis, HTN, dyslipidemia, and COPD.  Also obesity hypoventilation syndrome with chronic respiratory failure.  She has OHS/OSA on oxygen during the day and CPAP at night.  She was seen and had RHC in 02/2015 (see below) with plans to place her on ERA (macitentan) after left heart filling pressure optimized as there is some evidence for ERA use in sarcoidosis-related PAH. Awaiting care manager getting her approved for the medication.  Was to be seen in clinic on 04/05/15.  Pt has not heard if she has been approved or not.   (Also with ER visit 03/22/15 for chest discomfort with normal troponin and EKG without ST segment changes but ectopic atrial rhythm noted on EKG .  Was seen by Dr. Marlou Porch and discharged.)  [place] Procedural Findings 03/17/15: Hemodynamics (mmHg) RA mean 16 RV 73/22 PA 70/32, mean 47 PCWP mean 20 Oxygen saturations: PA 69% AO 96% Cardiac Output (Fick) 6.56  Cardiac Index (Fick) 2.74 PVR 4.1 WU Cardiac Output (Thermo) 4.95 Cardiac Index (Thermo) 2.07 PVR 5.5 WU  Last Echo 12/21/14: Left ventricle: The cavity size was normal. Wall thickness was increased in a pattern of moderate LVH. Systolic function was vigorous. The estimated ejection fraction was in the range of 65% to 70%. Doppler parameters are consistent with abnormal left ventricular relaxation (grade 1 diastolic dysfunction). Doppler parameters are consistent with high ventricular filling pressure. - Ventricular septum: The contour showed diastolic flattening and systolic  flattening. - Mitral valve: There was mild regurgitation. - Right ventricle: The cavity size was moderately dilated. Systolic function was severely reduced. - Right atrium: The atrium was mildly dilated. - Atrial septum: The septum bowed from right to left, consistent with increased right atrial pressure. - Pulmonary arteries: Systolic pressure was severely increased. PA peak pressure: 106 mm Hg (S). Impressions: - Normal LV function; grade 1 diastolic dysfunction; RAE/RVE; severely reduced RV function; severely elevated pulmonary pressure.              NOW admitted for SOB, increasing over last 3 days prior to admit. She had done PT the day before and then the next day SOB.  She was hypoxic even with her home oxygen.   Was hypoxic in ER with SP02 of 81%.  No wt changes.  This was felt to be COPD exacerbation and IV steroid taper was started along with duonebs, 02, her diastolic HF was stable and hr po torsemide, KCL and spironolactone were continued- but then held due to bump in Cr. - now back on the torsemide.  She initially felt better but now has trouble walking 10 feet from her hospital bed to the door.  She can lie pretty flat without trouble. She complains of foul smelling urine.   Also of sore throat from coughing and sore chest wall with coughing.  Episode of K+ 6.5 yesterday given Ca+ gluconate and 30 of Kayexalate with K+ down to 4.5 and today 3.7.  Wt at discharge 292.15 lbs on re-admit 292.1, now 296.14 lbs  Pt tells  me she has not smoked since discharge in June.    Past Medical History  Diagnosis Date  . Hypertension   . Hyperlipidemia   . Chronic headache   . Fibromyalgia     daily narcotics  . Anxiety     hx chronic BZ use, stopped 07/2010  . Anemia   . Pulmonary sarcoidosis     unimpressive CT chest 2011  . Colonic polyp   . GERD (gastroesophageal reflux disease)   . ALLERGIC RHINITIS   . Asthma   . CHF (congestive heart failure)      Diastolic with fluid overload, May, 2012, LVEF 60%  . Morbid obesity   . Depression   . Panic attacks   . Diabetes mellitus   . COPD (chronic obstructive pulmonary disease)     on home O2, moderate airflow obstruction, suspect d/t emphysema  . Obesity   . Elevated LFTs 09/2011  . Ovarian cyst   . Chronic back pain   . Sleep apnea      CPAP  . Fatty liver   . Esophagitis   . Gastritis   . Internal hemorrhoids   . Adenomatous colon polyp 12/03/09    [facility] in [place], [place]    Past Surgical History  Procedure Laterality Date  . Polypectomy  2011  . Lumbar microdiscectomy  07/06/2011    R L4-5, stern  . Back surgery    . Carpal tunnel release    . Steroid spinal injections    . Hysteroscopy w/d&c N/A 03/25/2013    Procedure: DILATATION AND CURETTAGE /HYSTEROSCOPY;  Surgeon: [name], MD;  Location: [facility];  Service: Gynecology;  Laterality: N/A;  . Dilation and curettage of uterus    . Cardiac catheterization N/A 03/17/2015    Procedure: Right Heart Cath;  Surgeon: Larey Dresser, MD;  Location: [facility];  Service: Cardiovascular;  Laterality: N/A;    Family History  Problem Relation Age of Onset  . Hypertension Mother   . Heart disease Mother   . Emphysema Father   . Hypertension Father   . Stomach cancer Father   . Heart disease Father   . Allergies Brother   . Stomach cancer Brother   . Congestive Heart Failure Brother   . Diabetes type II Brother   . Heart disease Sister   . Diabetes type II Sister   . Heart attack Sister   . Stroke Sister   . Hypertension Sister   . Heart failure Sister     diastolic  . Hypertension Sister    Social History:  reports that she quit smoking about 4 months ago. Her smoking use included Cigarettes. She has a 7.25 pack-year smoking history. She has never used smokeless tobacco. She reports that she drinks alcohol. She reports that she uses illicit drugs (Cocaine and Marijuana).  Allergies:  Allergies    Allergen Reactions  . Simvastatin Other (See Comments)    Severe leg pain and burning  . Sulfamethoxazole-Trimethoprim Nausea And Vomiting    Causes Projectile Vomiting  . Zolpidem Tartrate Other (See Comments)    Chest pain  . Azithromycin Other (See Comments)    Resistant to med  . Ceftin [Cefuroxime] Swelling and Other (See Comments)    MOUTH SWELLING  . Doxycycline Other (See Comments)    Resistant to med  . Latex Itching and Rash    Also burning sensations  . Metformin And Related Diarrhea    Severe diarrhea  . Metolazone Other (See  Comments)    MYALGIAS  . Metronidazole Nausea And Vomiting  . Ciprofloxacin Nausea Only  . Tramadol Itching and Nausea Only    OUTPATIENT MEDICATIONS: No current facility-administered medications on file prior to encounter.   Current Outpatient Prescriptions on File Prior to Encounter  Medication Sig Dispense Refill  . albuterol (PROVENTIL) (2.5 MG/3ML) 0.083% nebulizer solution INHALE CONTENTS OF 1 VIAL VIA NEBULIZER EVERY 6 HOURS AS NEEDED FOR WHEEZING OR SHORTNESS OF BREATH 1125 mL 0  . ALPRAZolam (XANAX) 1 MG tablet Take 1 tablet (1 mg total) by mouth 3 (three) times daily. 90 tablet 3  . aspirin EC 81 MG tablet Take 81 mg by mouth daily.    Marland Kitchen BIOTIN PO Take 1 capsule by mouth daily.    . budesonide-formoterol (SYMBICORT) 160-4.5 MCG/ACT inhaler Inhale 2 puffs into the lungs 2 (two) times daily. 1 Inhaler 3  . cetirizine (ZYRTEC) 1 MG/ML syrup Take 10 mLs (10 mg total) by mouth at bedtime. 300 mL 0  . diclofenac sodium (VOLTAREN) 1 % GEL Apply 4 g topically 4 (four) times daily. 100 g 0  . EMBEDA 20-0.8 MG CPCR Take 1 tablet by mouth daily.  0  . esomeprazole (NEXIUM) 40 MG capsule Take 40 mg by mouth 2 (two) times daily before a meal.    . fluticasone (FLONASE) 50 MCG/ACT nasal spray Place 2 sprays into both nostrils daily. (Patient taking differently: Place 2 sprays into both nostrils daily as needed for allergies or rhinitis. ) 16 g 5   . gabapentin (NEURONTIN) 300 MG capsule Take 300 mg by mouth daily as needed (neuropathy pain).     Marland Kitchen glimepiride (AMARYL) 1 MG tablet Take 1 mg by mouth daily with breakfast.    . hydrOXYzine (ATARAX/VISTARIL) 10 MG tablet TAKE 1 TABLET BY MOUTH THREE TIMES DAILY AS NEEDED FOR ITCHING 30 tablet 11  . insulin aspart (NOVOLOG FLEXPEN) 100 UNIT/ML FlexPen Inject 5 Units into the skin 3 (three) times daily with meals. 21 mL 0  . Insulin Glargine (LANTUS SOLOSTAR) 100 UNIT/ML Solostar Pen Inject 40 Units into the skin 2 (two) times daily. 15 mL 0  . Multiple Vitamin (MULTIVITAMIN) capsule Take 1 capsule by mouth once a week. Mondays    . omega-3 fish oil (MAXEPA) 1000 MG CAPS capsule Take 1 capsule by mouth daily.    . potassium chloride SA (K-DUR,KLOR-CON) 20 MEQ tablet Take 2 tablets (40 mEq total) by mouth 2 (two) times daily. 180 tablet 11  . PROAIR HFA 108 (90 BASE) MCG/ACT inhaler INHALE 2 PUFFS BY MOUTH EVERY 6 HOURS AS NEEDED FOR WHEEZING OR SHORTNESS OF BREATH 8.5 g 0  . spironolactone (ALDACTONE) 25 MG tablet TAKE ONE TABLET BY MOUTH TWICE DAILY 180 tablet 0  . tiotropium (SPIRIVA HANDIHALER) 18 MCG inhalation capsule Place 1 capsule (18 mcg total) into inhaler and inhale daily. 30 capsule 3  . tiZANidine (ZANAFLEX) 2 MG tablet Take 2 mg by mouth 2 (two) times daily as needed for muscle spasms.   3  . torsemide (DEMADEX) 20 MG tablet Take 2 tablets (40 mg total) by mouth 2 (two) times daily. 80 tablet 0  . varenicline (CHANTIX) 0.5 MG tablet Take 1 tablet (0.5 mg total) by mouth 2 (two) times daily. 60 tablet 3  . venlafaxine XR (EFFEXOR XR) 37.5 MG 24 hr capsule Take 1 capsule (37.5 mg total) by mouth daily with breakfast. 30 capsule 3    CURRENT MEDICATIONS: Scheduled Meds: . ALPRAZolam  1 mg Oral  TID  . aspirin EC  81 mg Oral Daily  . enoxaparin (LOVENOX) injection  60 mg Subcutaneous Q24H  . guaiFENesin  1,200 mg Oral BID  . insulin aspart  0-20 Units Subcutaneous TID WC  .  insulin glargine  50 Units Subcutaneous BID  . ipratropium-albuterol  3 mL Nebulization TID  . levofloxacin (LEVAQUIN) IV  750 mg Intravenous Q24H  . loratadine  10 mg Oral QHS  . multivitamin with minerals  1 tablet Oral Q Mon  . omega-3 acid ethyl esters  1 capsule Oral Daily  . pantoprazole  40 mg Oral Daily  . predniSONE  50 mg Oral Q breakfast  . sodium polystyrene  15 g Oral Once  . spironolactone  25 mg Oral BID  . torsemide  40 mg Oral BID  . venlafaxine XR  37.5 mg Oral Q breakfast   Continuous Infusions:  PRN Meds:.acetaminophen **OR** acetaminophen, alum & mag hydroxide-simeth, gabapentin, guaiFENesin-dextromethorphan, HYDROmorphone (DILAUDID) injection, hydrOXYzine, ipratropium-albuterol, menthol-cetylpyridinium, ondansetron **OR** ondansetron (ZOFRAN) IV, oxyCODONE    Results for orders placed or performed during the hospital encounter of 04/02/15 (from the past 48 hour(s))  Glucose, capillary     Status: Abnormal   Collection Time: 04/04/15 11:48 AM  Result Value Ref Range   Glucose-Capillary 419 (H) 65 - 99 mg/dL  Glucose, capillary     Status: Abnormal   Collection Time: 04/04/15  4:24 PM  Result Value Ref Range   Glucose-Capillary 366 (H) 65 - 99 mg/dL  Glucose, capillary     Status: Abnormal   Collection Time: 04/04/15  9:52 PM  Result Value Ref Range   Glucose-Capillary 342 (H) 65 - 99 mg/dL  Basic metabolic panel     Status: Abnormal   Collection Time: 04/05/15  3:50 AM  Result Value Ref Range   Sodium 131 (L) 135 - 145 mmol/L   Potassium 4.5 3.5 - 5.1 mmol/L    Comment: DELTA CHECK NOTED   Chloride 92 (L) 101 - 111 mmol/L   CO2 29 22 - 32 mmol/L   Glucose, Bld 310 (H) 65 - 99 mg/dL   BUN 25 (H) 6 - 20 mg/dL   Creatinine, Ser 1.18 (H) 0.44 - 1.00 mg/dL   Calcium 9.2 8.9 - 10.3 mg/dL   GFR calc non Af Amer 54 (L) >60 mL/min   GFR calc Af Amer >60 >60 mL/min    Comment: (NOTE) The eGFR has been calculated using the CKD EPI equation. This calculation  has not been validated in all clinical situations. eGFR's persistently <60 mL/min signify possible Chronic Kidney Disease.    Anion gap 10 5 - 15  CBC     Status: Abnormal   Collection Time: 04/05/15  3:50 AM  Result Value Ref Range   WBC 9.3 4.0 - 10.5 K/uL   RBC 5.12 (H) 3.87 - 5.11 MIL/uL   Hemoglobin 12.9 12.0 - 15.0 g/dL   HCT 40.8 36.0 - 46.0 %   MCV 79.7 78.0 - 100.0 fL   MCH 25.2 (L) 26.0 - 34.0 pg   MCHC 31.6 30.0 - 36.0 g/dL   RDW 17.1 (H) 11.5 - 15.5 %   Platelets 138 (L) 150 - 400 K/uL  Glucose, capillary     Status: Abnormal   Collection Time: 04/05/15  6:23 AM  Result Value Ref Range   Glucose-Capillary 334 (H) 65 - 99 mg/dL  Glucose, capillary     Status: Abnormal   Collection Time: 04/05/15 11:10 AM  Result Value Ref  Range   Glucose-Capillary 358 (H) 65 - 99 mg/dL  Glucose, capillary     Status: Abnormal   Collection Time: 04/05/15  4:49 PM  Result Value Ref Range   Glucose-Capillary 375 (H) 65 - 99 mg/dL  Glucose, capillary     Status: Abnormal   Collection Time: 04/05/15  8:40 PM  Result Value Ref Range   Glucose-Capillary 445 (H) 65 - 99 mg/dL   Comment 1 Notify RN    Comment 2 Document in Chart   Basic metabolic panel     Status: Abnormal   Collection Time: 04/06/15  3:15 AM  Result Value Ref Range   Sodium 136 135 - 145 mmol/L   Potassium 3.7 3.5 - 5.1 mmol/L    Comment: DELTA CHECK NOTED   Chloride 90 (L) 101 - 111 mmol/L   CO2 36 (H) 22 - 32 mmol/L   Glucose, Bld 194 (H) 65 - 99 mg/dL   BUN 23 (H) 6 - 20 mg/dL   Creatinine, Ser 1.05 (H) 0.44 - 1.00 mg/dL   Calcium 9.2 8.9 - 10.3 mg/dL   GFR calc non Af Amer >60 >60 mL/min   GFR calc Af Amer >60 >60 mL/min    Comment: (NOTE) The eGFR has been calculated using the CKD EPI equation. This calculation has not been validated in all clinical situations. eGFR's persistently <60 mL/min signify possible Chronic Kidney Disease.    Anion gap 10 5 - 15  CBC     Status: Abnormal   Collection Time:  04/06/15  3:15 AM  Result Value Ref Range   WBC 8.1 4.0 - 10.5 K/uL   RBC 5.07 3.87 - 5.11 MIL/uL   Hemoglobin 12.7 12.0 - 15.0 g/dL   HCT 39.8 36.0 - 46.0 %   MCV 78.5 78.0 - 100.0 fL   MCH 25.0 (L) 26.0 - 34.0 pg   MCHC 31.9 30.0 - 36.0 g/dL   RDW 17.3 (H) 11.5 - 15.5 %   Platelets 160 150 - 400 K/uL  Glucose, capillary     Status: Abnormal   Collection Time: 04/06/15  6:43 AM  Result Value Ref Range   Glucose-Capillary 162 (H) 65 - 99 mg/dL  Glucose, capillary     Status: Abnormal   Collection Time: 04/06/15 11:05 AM  Result Value Ref Range   Glucose-Capillary 154 (H) 65 - 99 mg/dL   *Note: Due to a large number of results and/or encounters for the requested time period, some results have not been displayed. A complete set of results can be found in Results Review.   No results found.  ROS:  General:no colds or fevers, weight was stable at home Skin:no rashes or ulcers HEENT:no blurred vision, no congestion CV:see HPI PUL:see HPI GI:no diarrhea constipation or melena, no indigestion GU:no hematuria, no dysuria, + foul smelling MS:no joint pain, no claudication Neuro:no syncope, no lightheadedness Endo:+ diabetes poorly controlled, no thyroid disease   Blood pressure 126/68, pulse 87, temperature 98 F (36.7 C), temperature source Axillary, resp. rate 20, height 5' 8" (1.727 m), weight 296 lb 14.4 oz (134.673 kg), last menstrual period 10/16/2012, SpO2 93 %.  Wt Readings from Last 3 Encounters:  03/21/15 296 lb 1.2 oz (134.3 kg)  02/16/15 299 lb (135.626 kg)  01/15/15 295 lb (133.811 kg)    PE: General:Pleasant affect, NAD, oxygen in place Skin:Warm and dry, brisk capillary refill HEENT:normocephalic, sclera clear, mucus membranes moist Neck: Thick, JVP 8-9 cm  Heart:S1S2 RRR without murmur, gallup, rub or  click Lungs:diminished, not much air movement, without rales, rhonchi, or wheezes CHE:NIDPO, soft, non tender, + BS, do not palpate liver spleen or  masses Ext:no lower ext edema, 2+ pedal pulses, 2+ radial pulses Neuro:alert and oriented X 3, MAE, follows commands, + facial symmetry    Assessment/Plan Principal Problem:   COPD exacerbation- improved initially but now with increased DOE, cannot walk 10 feet in the room.  Active Problems:   PULMONARY SARCOIDOSIS- followed by pulmonary    Essential hypertension- controlled   Obstructive sleep apnea- uses CPAP   Diabetes type 2, uncontrolled   Chronic diastolic CHF (congestive heart failure)- now on torsemide and volume is stable.    Hypoxia- maintaining 97% on 4 L   Pulmonary HTN-  Plans to place on ERA (macitentan) after left heart filling pressure optimized as there is some evidence for ERA use in sarcoidosis-related PAH.  She has not heard yet to see if approved.  Dr. Aundra Dubin to see.    [facility] Practitioner Certified [place] Pager [phone] or after 5pm or weekends call [phone] 04/06/2015, 11:45 AM  Patient seen with NP, agree with the above note.   49 yo with history of chronic respiratory failure on home oxygen and CPAP with OSA, COPD, and sarcoidosis presented with suspected COPD exacerbation.   1. 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 She has quit smoking on Chantix. She has OHS/OSA on oxygen during the day and CPAP at night. She also has biopsy-proven sarcoidosis. She has had some changes on chest CT indicative of possible sarcoidosis progression, but per last pulmonary note this does not seem to be her primary problem at this time. She has not been on steroids for sarcoidosis chronically. She is being treated this admission for COPD exacerbation and is now on prednisone. V/Q scan in 3/16 concerning for COPD versus chronic PE, but she has had several chest CTAs that are not indicative of acute or chronic PE.  - Plan to continue steroids and nebs for COPD exacerbation.  She is also on  levofloxacin. - Continue oxygen and CPAP for OHS.  2. Pulmonary hypertension: Multiple possible reasons for pulmonary hypertension. PA systolic pressure estimate 106 mmHg by last echo. [place] 6/16 showed severe pulmonary arterial hypertension with some pulmonary venous hypertension => RA 16, PA 70/32 mean 46, PCWP 20, PVR 5.5. There is definitely a contribution from COPD and OHS/OSA (group 3). However, there could be a contribution from sarcoidosis.   - Will start her on ERA (macitentan) after left heart filling pressure optimized as there is some evidence for ERA use in sarcoidosis-related PAH. We have been working on getting approval for this med, will check to see where we are in the process.  3. Acute on chronic diastolic CHF with prominent RV dysfunction: Severely reduced RV function on echo. R>L heart failure on cath in 6/16.  On exam, I do think she has some volume overload. She is currently on torsemide 40 mg bid.  - I am going to diurese her with IV Lasix for a day or two, start Lasix 80 mg IV bid.   Loralie Champagne 04/06/2015 4:25 PM

## 2015-04-07 DIAGNOSIS — I5032 Chronic diastolic (congestive) heart failure: Secondary | ICD-10-CM

## 2015-04-07 DIAGNOSIS — R0902 Hypoxemia: Secondary | ICD-10-CM

## 2015-04-07 DIAGNOSIS — I10 Essential (primary) hypertension: Secondary | ICD-10-CM

## 2015-04-07 DIAGNOSIS — G4733 Obstructive sleep apnea (adult) (pediatric): Secondary | ICD-10-CM

## 2015-04-07 DIAGNOSIS — J441 Chronic obstructive pulmonary disease with (acute) exacerbation: Principal | ICD-10-CM

## 2015-04-07 DIAGNOSIS — Z9981 Dependence on supplemental oxygen: Secondary | ICD-10-CM

## 2015-04-07 DIAGNOSIS — E1165 Type 2 diabetes mellitus with hyperglycemia: Secondary | ICD-10-CM

## 2015-04-07 LAB — CBC
HCT: 40.4 % (ref 36.0–46.0)
Hemoglobin: 13 g/dL (ref 12.0–15.0)
MCH: 25.5 pg — AB (ref 26.0–34.0)
MCHC: 32.2 g/dL (ref 30.0–36.0)
MCV: 79.2 fL (ref 78.0–100.0)
PLATELETS: 164 10*3/uL (ref 150–400)
RBC: 5.1 MIL/uL (ref 3.87–5.11)
RDW: 17.5 % — ABNORMAL HIGH (ref 11.5–15.5)
WBC: 6.9 10*3/uL (ref 4.0–10.5)

## 2015-04-07 LAB — GLUCOSE, CAPILLARY
GLUCOSE-CAPILLARY: 232 mg/dL — AB (ref 65–99)
GLUCOSE-CAPILLARY: 459 mg/dL — AB (ref 65–99)
Glucose-Capillary: 241 mg/dL — ABNORMAL HIGH (ref 65–99)
Glucose-Capillary: 400 mg/dL — ABNORMAL HIGH (ref 65–99)

## 2015-04-07 LAB — BASIC METABOLIC PANEL
Anion gap: 10 (ref 5–15)
BUN: 20 mg/dL (ref 6–20)
CO2: 34 mmol/L — AB (ref 22–32)
Calcium: 8.8 mg/dL — ABNORMAL LOW (ref 8.9–10.3)
Chloride: 91 mmol/L — ABNORMAL LOW (ref 101–111)
Creatinine, Ser: 0.97 mg/dL (ref 0.44–1.00)
GFR calc Af Amer: >60 mL/min (ref >60)
GFR calc non Af Amer: >60 mL/min (ref >60)
Glucose, Bld: 257 mg/dL — ABNORMAL HIGH (ref 65–99)
Potassium: 3.8 mmol/L (ref 3.5–5.1)
Sodium: 135 mmol/L (ref 135–145)

## 2015-04-07 MED ORDER — PREDNISONE 20 MG PO TABS
40.0000 mg | ORAL_TABLET | Freq: Every day | ORAL | Status: DC
  Administered 2015-04-08: 40 mg via ORAL
  Filled 2015-04-07 (×2): qty 2

## 2015-04-07 MED ORDER — HYDROMORPHONE HCL 1 MG/ML IJ SOLN
1.0000 mg | Freq: Once | INTRAMUSCULAR | Status: AC
  Administered 2015-04-07: 1 mg via INTRAVENOUS

## 2015-04-07 MED ORDER — LEVOFLOXACIN 750 MG PO TABS
750.0000 mg | ORAL_TABLET | Freq: Every day | ORAL | Status: DC
  Administered 2015-04-07 – 2015-04-09 (×3): 750 mg via ORAL
  Filled 2015-04-07 (×3): qty 1

## 2015-04-07 MED ORDER — POTASSIUM CHLORIDE CRYS ER 20 MEQ PO TBCR
40.0000 meq | EXTENDED_RELEASE_TABLET | Freq: Once | ORAL | Status: AC
  Administered 2015-04-07: 40 meq via ORAL
  Filled 2015-04-07: qty 2

## 2015-04-07 MED ORDER — INSULIN ASPART 100 UNIT/ML ~~LOC~~ SOLN
6.0000 [IU] | Freq: Three times a day (TID) | SUBCUTANEOUS | Status: DC
Start: 2015-04-08 — End: 2015-04-11
  Administered 2015-04-08 – 2015-04-11 (×9): 6 [IU] via SUBCUTANEOUS

## 2015-04-07 MED ORDER — INSULIN ASPART 100 UNIT/ML ~~LOC~~ SOLN
6.0000 [IU] | Freq: Once | SUBCUTANEOUS | Status: AC
  Administered 2015-04-07: 6 [IU] via SUBCUTANEOUS

## 2015-04-07 MED ORDER — OXYCODONE HCL 5 MG PO TABS
5.0000 mg | ORAL_TABLET | Freq: Four times a day (QID) | ORAL | Status: DC | PRN
  Administered 2015-04-07 – 2015-04-10 (×10): 10 mg via ORAL
  Administered 2015-04-10: 5 mg via ORAL
  Administered 2015-04-11: 10 mg via ORAL
  Administered 2015-04-11: 5 mg via ORAL
  Filled 2015-04-07 (×7): qty 2
  Filled 2015-04-07: qty 1
  Filled 2015-04-07 (×6): qty 2

## 2015-04-07 MED ORDER — HYDROMORPHONE HCL 1 MG/ML IJ SOLN
INTRAMUSCULAR | Status: AC
  Filled 2015-04-07: qty 1

## 2015-04-07 MED ORDER — INSULIN ASPART 100 UNIT/ML ~~LOC~~ SOLN: 6.0000 [IU] | Freq: Three times a day (TID) | SUBCUTANEOUS | Status: DC

## 2015-04-07 MED ORDER — TIZANIDINE HCL 2 MG PO TABS
2.0000 mg | ORAL_TABLET | Freq: Two times a day (BID) | ORAL | Status: DC | PRN
  Administered 2015-04-07 – 2015-04-09 (×4): 2 mg via ORAL
  Filled 2015-04-07 (×7): qty 1

## 2015-04-07 NOTE — Progress Notes (Signed)
Benefit check in progress for [employer] ERA (macitentan); Mindi Slicker [facility] [phone]

## 2015-04-07 NOTE — Progress Notes (Signed)
[facility] PROGRESS NOTE   [name] LOV:[id number] DOB: 06-24-1966 DOA: 04/02/2015 PCP: Gwendolyn Grant, MD  HPI/Subjective: Feeling better; afebrile and with improved breathing. No CP. Complaining of muscle spasm and pain all over.  Assessment/Plan: Principal Problem:   COPD exacerbation Active Problems:   PULMONARY SARCOIDOSIS   Essential hypertension   Obstructive sleep apnea   Diabetes type 2, uncontrolled   Chronic diastolic CHF (congestive heart failure)   Hypoxia   Pulmonary HTN    COPD exacerbation The patient is presenting with complains of significantly worsening SOB and hypoxia. Chest x-ray showed no acute events, finding consistent with chronic sarcoidosis and bronchiectasis. Most likely her presentation appears to be secondary to COPD flareup. Supportive management with appropriate bronchodilators, mucolytics, antitussives and oxygen. Continue slowly steroids tapering, PO antibiotics and nebulizer treatments.  Acute on chronic respiratory failure with hypoxia Patient has chronic respiratory failure on 3 L of oxygen at home. With above-mentioned symptoms she developed hypoxia with oxygen of 81%, increased oxygen flow to 4 L; This is likely secondary to her COPD exacerbation along with PAH, continue above management. Try to wean oxygen down to her home dose as tolerated.  Pulmonary hypertension, severe Cardiac cath was done recently and showed moderate elevation of right heart filling pressure, suggesting R > L heart failure. Plan is to start macitenan once approved by insurance, she was supposed to follow with the heart failure clinic on 04/05/2015. Continue diuretics.  Chronic pain syndrome will resume zanaflex and continue PRN oxycodone Will discontinue IV dialudid  Chronic diastolic CHF Seen by cardiology, not overtly overloaded, Cardiology decided to give her 1 or 2 days of IV Lasix. Will follow rec's  [place] Per cardiology patient be  started on [place], pending approval. Likely related to pulmonary hypertension from sarcoidosis.  Obesity hypoventilation syndrome. Patient mentions she is using BiPAP daily at bedtime on a regular basis. We will continue the same while inpatient.  Hyponatremia This is pseudohyponatremia secondary to hyperglycemia Sodium 135 now Will monitor  Diabetes mellitus, insulin-dependent, uncontrolled. Last A1c in March 26 was 9.4, patient placed on insulin sliding scale, lantus and carbohydrate modified diet. Worsening of CBGs secondary to steroids; will adjust insulin therapy as needed  Hoarseness of voice Likely upper respiratory tract infection and cough  Continue tx with steroids, loratadine and antibiotics No PND reported  Hyperkalemia Potassium 6.5 today on admission Received calcium gluconate and 30 mg of Kayexalate K WNL now -will monitor electrolytes, especially with ongoing diuresis   Code Status: Full Code Family Communication: Plan discussed with the patient. Disposition Plan: Remains inpatient Diet: Diet heart healthy/carb modified Room service appropriate?: Yes; Fluid consistency:: Thin  Consultants:  None  Procedures:  None  Antibiotics:  Levofloxacin   Objective: Filed Vitals:   04/06/15 2113  BP: 129/72  Pulse: 92  Temp: 98 F (36.7 C)  Resp: 20    Intake/Output Summary (Last 24 hours) at 04/07/15 1126 Last data filed at 04/07/15 0913  Gross per 24 hour  Intake   2280 ml  Output   5500 ml  Net  -3220 ml   Filed Weights   04/06/15 2113  Weight: 131.271 kg (289 lb 6.4 oz)    Exam: General: Alert and awake, oriented x3, not in any acute distress. No fever, no CP and with some improvement in her breathing. Still complaining of pain all over HEENT: anicteric sclera, pupils reactive to light and accommodation, EOMI, hoarse  CVS: S1-S2 clear, no murmur, rubs or gallops, RRR Chest:  improved air movement, no wheezing, no rales; no frank  crackles appreciated; but distant BS on auscultation  Abdomen: soft nontender, nondistended, normal bowel sounds, no organomegaly Extremities: no cyanosis, clubbing or edema noted bilaterally Neuro: Cranial nerves II-XII intact, no focal neurological deficits  Data Reviewed: Basic Metabolic Panel:  Recent Labs Lab 04/04/15 0224 04/04/15 1027 04/05/15 0350 04/06/15 0315 04/07/15 0630  NA 128* 127* 131* 136 135  K 6.5* 5.9* 4.5 3.7 3.8  CL 91* 90* 92* 90* 91*  CO2 27 27 29  36* 34*  GLUCOSE 420* 530* 310* 194* 257*  BUN 20 20 25* 23* 20  CREATININE 1.23* 1.22* 1.18* 1.05* 0.97  CALCIUM 9.4 9.0 9.2 9.2 8.8*  MG 2.7*  --   --   --   --    CBC:  Recent Labs Lab 04/02/15 2006 04/03/15 0418 04/04/15 0224 04/05/15 0350 04/06/15 0315 04/07/15 0630  WBC 8.5 9.7 14.9* 9.3 8.1 6.9  NEUTROABS 6.0  --   --   --   --   --   HGB 13.7 13.4 12.6 12.9 12.7 13.0  HCT 42.6 41.9 39.3 40.8 39.8 40.4  MCV 78.5 78.5 78.4 79.7 78.5 79.2  PLT 143* 145* 141* 138* 160 164   BNP (last 3 results)  Recent Labs  12/13/14 0820 12/18/14 1846 03/19/15 0441  BNP 29.7 231.0* 61.9    ProBNP (last 3 results)  Recent Labs  04/17/14 0120 07/20/14 1817  PROBNP 44.7 39.7    CBG:  Recent Labs Lab 04/06/15 0643 04/06/15 1105 04/06/15 1558 04/06/15 2111 04/07/15 0636  GLUCAP 162* 154* 356* 385* 232*    Micro Recent Results (from the past 240 hour(s))  MRSA PCR Screening     Status: None   Collection Time: 04/03/15  5:13 AM  Result Value Ref Range Status   MRSA by PCR NEGATIVE NEGATIVE Final    Comment:        The GeneXpert MRSA Assay (FDA approved for NASAL specimens only), is one component of a comprehensive MRSA colonization surveillance program. It is not intended to diagnose MRSA infection nor to guide or monitor treatment for MRSA infections.      Studies: No results found.  Scheduled Meds: . ALPRAZolam  1 mg Oral TID  . aspirin EC  81 mg Oral Daily  .  enoxaparin (LOVENOX) injection  60 mg Subcutaneous Q24H  . furosemide  80 mg Intravenous BID  . guaiFENesin  1,200 mg Oral BID  .  HYDROmorphone (DILAUDID) injection  1 mg Intravenous Once  . insulin aspart  0-20 Units Subcutaneous TID WC  . insulin glargine  50 Units Subcutaneous BID  . ipratropium-albuterol  3 mL Nebulization TID  . levofloxacin  750 mg Oral Daily  . loratadine  10 mg Oral QHS  . multivitamin with minerals  1 tablet Oral Q Mon  . omega-3 acid ethyl esters  1 capsule Oral Daily  . pantoprazole  40 mg Oral Daily  . [START ON 04/08/2015] predniSONE  40 mg Oral Q breakfast  . sodium polystyrene  15 g Oral Once  . spironolactone  25 mg Oral BID  . venlafaxine XR  37.5 mg Oral Q breakfast   Continuous Infusions:     Time spent: 35 minutes    Barton Dubois  [facility] Pager [phone] If 7PM-7AM, please contact night-coverage at [email or web address], password [facility] 04/07/2015, 11:26 AM  LOS: 5 days

## 2015-04-07 NOTE — Progress Notes (Signed)
CSW received order that patient wished to speak to a Education officer, museum about home needs, transportation needs and securing a psychiatrist.  CSW attempted to speak with patient yesterday without success.  Met with patient today to offer assistance and resource information and she replied "I don't know who told you that I needed help with those things- I don't have any issues."  Patient indicated that she has family and friends who help her; no issues with transportation and she already has a Tax adviser. She denies any current SW needs and was appreciative of CSW's visit. Discussed CSW role and patient stated she would request help if she needed it.  CSW will sign off.  Lorie Phenix. Pauline Good, [place]

## 2015-04-07 NOTE — Progress Notes (Signed)
   RE: Benefit check  macitentan     Marvia Pickles CMA            CO-PAY $ 3.60  FOR [facility] RX

## 2015-04-07 NOTE — Progress Notes (Signed)
[name], female   DOB: 03/11/1966, 49 y.o.   MRN: [id number]   SUBJECTIVE: Breathing better.  Good diuresis yesterday.   Scheduled Meds: . ALPRAZolam  1 mg Oral TID  . aspirin EC  81 mg Oral Daily  . enoxaparin (LOVENOX) injection  60 mg Subcutaneous Q24H  . furosemide  80 mg Intravenous BID  . guaiFENesin  1,200 mg Oral BID  . insulin aspart  0-20 Units Subcutaneous TID WC  . insulin glargine  50 Units Subcutaneous BID  . ipratropium-albuterol  3 mL Nebulization TID  . levofloxacin (LEVAQUIN) IV  750 mg Intravenous Q24H  . loratadine  10 mg Oral QHS  . multivitamin with minerals  1 tablet Oral Q Mon  . omega-3 acid ethyl esters  1 capsule Oral Daily  . pantoprazole  40 mg Oral Daily  . potassium chloride  40 mEq Oral Once  . predniSONE  50 mg Oral Q breakfast  . sodium polystyrene  15 g Oral Once  . spironolactone  25 mg Oral BID  . venlafaxine XR  37.5 mg Oral Q breakfast   Continuous Infusions:  PRN Meds:.acetaminophen **OR** acetaminophen, alum & mag hydroxide-simeth, gabapentin, guaiFENesin-dextromethorphan, HYDROmorphone (DILAUDID) injection, hydrOXYzine, ipratropium-albuterol, menthol-cetylpyridinium, ondansetron **OR** ondansetron (ZOFRAN) IV, oxyCODONE    Filed Vitals:   04/06/15 1435 04/06/15 1913 04/06/15 2113 04/07/15 0736  BP: 136/74  129/72   Pulse: 94  92   Temp: 97.9 F (36.6 C)  98 F (36.7 C)   TempSrc: Oral  Oral   Resp: 18  20   Height:      Weight:   289 lb 6.4 oz (131.271 kg)   SpO2: 97% 97% 98% 98%    Intake/Output Summary (Last 24 hours) at 04/07/15 0802 Last data filed at 04/07/15 0600  Gross per 24 hour  Intake   2280 ml  Output   5050 ml  Net  -2770 ml    LABS: Basic Metabolic Panel:  Recent Labs  04/06/15 0315 04/07/15 0630  NA 136 135  K 3.7 3.8  CL 90* 91*  CO2 36* 34*  GLUCOSE 194* 257*  BUN 23* 20  CREATININE 1.05* 0.97  CALCIUM 9.2 8.8*   Liver Function Tests: No results for input(s): AST, ALT,  ALKPHOS, BILITOT, PROT, ALBUMIN in the last 72 hours. No results for input(s): LIPASE, AMYLASE in the last 72 hours. CBC:  Recent Labs  04/06/15 0315 04/07/15 0630  WBC 8.1 6.9  HGB 12.7 13.0  HCT 39.8 40.4  MCV 78.5 79.2  PLT 160 164   Cardiac Enzymes: No results for input(s): CKTOTAL, CKMB, CKMBINDEX, TROPONINI in the last 72 hours. BNP: Invalid input(s): POCBNP D-Dimer: No results for input(s): DDIMER in the last 72 hours. Hemoglobin A1C:  Recent Labs  04/04/15 1027  HGBA1C 9.3*   Fasting Lipid Panel: No results for input(s): CHOL, HDL, LDLCALC, TRIG, CHOLHDL, LDLDIRECT in the last 72 hours. Thyroid Function Tests: No results for input(s): TSH, T4TOTAL, T3FREE, THYROIDAB in the last 72 hours.  Invalid input(s): FREET3 Anemia Panel: No results for input(s): VITAMINB12, FOLATE, FERRITIN, TIBC, IRON, RETICCTPCT in the last 72 hours.  RADIOLOGY: Dg Chest 2 View  04/02/2015   CLINICAL DATA:  Patient with shortness of breath. History of pulmonary hypertension.  EXAM: CHEST  2 VIEW  COMPARISON:  Chest radiograph 03/22/2015  FINDINGS: Monitoring leads overlie the patient. Stable cardiac and mediastinal contours. Unchanged bilateral coarse interstitial opacities predominately involving the lower lungs bilaterally. No superimposed consolidative opacity.  No pleural effusion or pneumothorax. Regional skeleton is unremarkable.  IMPRESSION: No acute cardiopulmonary process.  Unchanged basilar scarring compatible with sarcoidosis.   Electronically Signed   By: Lovey Newcomer M.D.   On: 04/02/2015 19:14   Dg Chest 2 View  03/22/2015   CLINICAL DATA:  Left-sided chest pain. Hypertension. History of sarcoidosis.  EXAM: CHEST  2 VIEW  COMPARISON:  CT chest 03/16/2015. PA and lateral chest 10/18/2014. Single view of the chest 03/15/2015.  FINDINGS: The lungs are emphysematous with unchanged patchy basilar opacities likely due to scar or atelectasis. No consolidative process, pneumothorax or  effusion is identified. Heart size is enlarged.  IMPRESSION: Cardiomegaly without acute disease.  Emphysema. Basilar scarring compatible sarcoidosis appears unchanged.   Electronically Signed   By: Inge Rise M.D.   On: 03/22/2015 18:55   Ct Angio Chest Pe W/cm &/or Wo Cm  03/16/2015   CLINICAL DATA:  Acute onset of shortness of breath and elevated D-dimer. Initial encounter.  EXAM: CT ANGIOGRAPHY CHEST WITH CONTRAST  TECHNIQUE: Multidetector CT imaging of the chest was performed using the standard protocol during bolus administration of intravenous contrast. Multiplanar CT image reconstructions and MIPs were obtained to evaluate the vascular anatomy.  CONTRAST:  79mL OMNIPAQUE IOHEXOL 350 MG/ML SOLN  COMPARISON:  Chest radiograph performed 03/15/2015, and CT of the chest performed 12/21/2014  FINDINGS: There is no evidence of significant pulmonary embolus.  Nodular areas of airspace opacification in the lung bases are stable from March, and likely reflect prior progression of the patient's pulmonary sarcoidosis. No superimposed focal airspace consolidation is seen. There is underlying centrilobular and paraseptal emphysema, diffuse interstitial thickening, and bronchiectasis at the lung bases; these findings are stable from prior studies. No pleural effusion or pneumothorax is seen. No suspicious masses are identified.  The mediastinum is unremarkable in appearance. A borderline prominent 1.1 cm right paratracheal node is seen, and a 1.5 cm azygoesophageal recess node is noted. No pericardial effusion is identified. Small peribronchial nodes remain borderline normal in size. No axillary lymphadenopathy is seen. The visualized portions of the thyroid gland are unremarkable in appearance.  The visualized portions of the liver and spleen are unremarkable. The visualized portions of the pancreas, stomach, adrenal glands and kidneys are within normal limits.  No acute osseous abnormalities are seen.  Review of  the MIP images confirms the above findings.  IMPRESSION: 1. No evidence of significant pulmonary embolus. 2. Stable nodular airspace opacification at the lung bases, reflecting prior progression of the patient's pulmonary sarcoidosis. No superimposed focal airspace consolidation seen. 3. Underlying centrilobular and paraseptal emphysema, diffuse interstitial thickening and bronchiectasis at the lung bases, stable from prior studies. 4. Enlarged mediastinal nodes likely reflect the patient's sarcoidosis.   Electronically Signed   By: Garald Balding M.D.   On: 03/16/2015 03:54   Mr Brain Wo Contrast  03/18/2015   CLINICAL DATA:  49 year old female with severe headache. Initial encounter. Current history of sarcoidosis, obstructive sleep apnea.  EXAM: MRI HEAD WITHOUT CONTRAST  TECHNIQUE: Multiplanar, multiecho pulse sequences of the brain and surrounding structures were obtained without intravenous contrast.  COMPARISON:  Neck CT 02/03/2011.  FINDINGS: The examination had to be discontinued prior to completion due to patient factors.  Cerebral volume is normal. No restricted diffusion to suggest acute infarction. No midline shift, mass effect, evidence of mass lesion, ventriculomegaly, extra-axial collection or acute intracranial hemorrhage. Cervicomedullary junction and pituitary are within normal limits. Major intracranial vascular flow voids are within normal limits.  No T2* imaging. Only sagittal T1 weighted imaging. Pearline Cables and white matter signal is within normal limits for age throughout the brain.  Grossly negative visualized internal auditory structures. Visualized paranasal sinuses and mastoids are clear. Orbits soft tissues appear normal. Grossly negative visualized cervical spine. Bone marrow signal appears normal.  IMPRESSION: 1. Negative noncontrast MRI appearance of the brain. 2.  The examination had to be discontinued prior to completion.   Electronically Signed   By: Genevie Ann M.D.   On: 03/18/2015 20:51     Dg Chest Portable 1 View  03/15/2015   CLINICAL DATA:  Shortness of breath, left chest pain ongoing for 2 days.  EXAM: PORTABLE CHEST - 1 VIEW  COMPARISON:  01/14/2015  FINDINGS: There is mild cardiomegaly. Bibasilar airspace opacities have increased since prior study. No effusions. No acute bony abnormality.  IMPRESSION: Increasing bibasilar opacities. There may be underlying chronic scarring. Cannot exclude superimposed edema or infection.   Electronically Signed   By: Rolm Baptise M.D.   On: 03/15/2015 21:06    PHYSICAL EXAM General: NAD Neck: Thick, JVP 8-9 cm, no thyromegaly or thyroid nodule.  Lungs: Distant breath sounds CV: Nondisplaced PMI.  Heart regular S1/S2, no S3/S4, no murmur.  No peripheral edema.  No carotid bruit.  Normal pedal pulses.  Abdomen: Soft, nontender, no hepatosplenomegaly, no distention.  Neurologic: Alert and oriented x 3.  Psych: Normal affect. Extremities: No clubbing or cyanosis.   TELEMETRY: Reviewed telemetry pt in NSR  ASSESSMENT AND PLAN: 49 yo with history of chronic respiratory failure on home oxygen and CPAP with OSA, COPD, and sarcoidosis presented with suspected COPD exacerbation.  1. 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 She has quit smoking on Chantix. She has OHS/OSA on oxygen during the day and CPAP at night. She also has biopsy-proven sarcoidosis. She has had some changes on chest CT indicative of possible sarcoidosis progression, but per last pulmonary note this does not seem to be her primary problem at this time. She has not been on steroids for sarcoidosis chronically. She is being treated this admission for COPD exacerbation and is now on prednisone. V/Q scan in 3/16 concerning for COPD versus chronic PE, but she has had several chest CTAs that are not indicative of acute or chronic PE.  - Plan to continue steroids and nebs for COPD exacerbation. She is also on levofloxacin. - Continue  oxygen and CPAP for OHS.  2. Pulmonary hypertension: Multiple possible reasons for pulmonary hypertension. PA systolic pressure estimate 106 mmHg by last echo. [place] 6/16 showed severe pulmonary arterial hypertension with some pulmonary venous hypertension => RA 16, PA 70/32 mean 46, PCWP 20, PVR 5.5. There is definitely a contribution from COPD and OHS/OSA (group 3). However, there could be a contribution from sarcoidosis.   - Will start her on ERA (macitentan) after left heart filling pressure optimized as there is some evidence for ERA use in sarcoidosis-related PAH. We have been working on getting approval for this med, will check to see where we are in the process.  3. Acute on chronic diastolic CHF with prominent RV dysfunction: Severely reduced RV function on echo. R>L heart failure on cath in 6/16. She diuresed well yesterday, still with volume overload.  - Continue IV Lasix today.   Loralie Champagne 04/07/2015 8:05 AM

## 2015-04-07 NOTE — Progress Notes (Signed)
Pt stated that she didn't need any help with her CPAP mask for tonight.

## 2015-04-08 DIAGNOSIS — D869 Sarcoidosis, unspecified: Secondary | ICD-10-CM

## 2015-04-08 LAB — BASIC METABOLIC PANEL
ANION GAP: 11 (ref 5–15)
BUN: 20 mg/dL (ref 6–20)
CALCIUM: 9.7 mg/dL (ref 8.9–10.3)
CHLORIDE: 87 mmol/L — AB (ref 101–111)
CO2: 35 mmol/L — ABNORMAL HIGH (ref 22–32)
CREATININE: 1.03 mg/dL — AB (ref 0.44–1.00)
GFR calc Af Amer: >60 mL/min (ref >60)
GFR calc non Af Amer: >60 mL/min (ref >60)
Glucose, Bld: 219 mg/dL — ABNORMAL HIGH (ref 65–99)
Potassium: 3.8 mmol/L (ref 3.5–5.1)
SODIUM: 133 mmol/L — AB (ref 135–145)

## 2015-04-08 LAB — CBC
HCT: 44.8 % (ref 36.0–46.0)
Hemoglobin: 14.6 g/dL (ref 12.0–15.0)
MCH: 25.4 pg — ABNORMAL LOW (ref 26.0–34.0)
MCHC: 32.6 g/dL (ref 30.0–36.0)
MCV: 77.9 fL — ABNORMAL LOW (ref 78.0–100.0)
Platelets: 171 10*3/uL (ref 150–400)
RBC: 5.75 MIL/uL — ABNORMAL HIGH (ref 3.87–5.11)
RDW: 17.4 % — ABNORMAL HIGH (ref 11.5–15.5)
WBC: 8.8 10*3/uL (ref 4.0–10.5)

## 2015-04-08 LAB — GLUCOSE, CAPILLARY
GLUCOSE-CAPILLARY: 216 mg/dL — AB (ref 65–99)
GLUCOSE-CAPILLARY: 217 mg/dL — AB (ref 65–99)
Glucose-Capillary: 348 mg/dL — ABNORMAL HIGH (ref 65–99)

## 2015-04-08 MED ORDER — INSULIN ASPART 100 UNIT/ML ~~LOC~~ SOLN
10.0000 [IU] | Freq: Once | SUBCUTANEOUS | Status: AC
  Administered 2015-04-08: 10 [IU] via SUBCUTANEOUS

## 2015-04-08 MED ORDER — PREDNISONE 20 MG PO TABS
20.0000 mg | ORAL_TABLET | Freq: Every day | ORAL | Status: DC
  Administered 2015-04-09 – 2015-04-10 (×2): 20 mg via ORAL
  Filled 2015-04-08 (×3): qty 1

## 2015-04-08 MED ORDER — TORSEMIDE 20 MG PO TABS
60.0000 mg | ORAL_TABLET | Freq: Two times a day (BID) | ORAL | Status: DC
  Administered 2015-04-08 (×2): 60 mg via ORAL
  Filled 2015-04-08 (×5): qty 3

## 2015-04-08 MED ORDER — FLUCONAZOLE 100 MG PO TABS
100.0000 mg | ORAL_TABLET | Freq: Every day | ORAL | Status: AC
  Administered 2015-04-08 – 2015-04-10 (×3): 100 mg via ORAL
  Filled 2015-04-08 (×3): qty 1

## 2015-04-08 MED ORDER — POTASSIUM CHLORIDE CRYS ER 20 MEQ PO TBCR
40.0000 meq | EXTENDED_RELEASE_TABLET | Freq: Once | ORAL | Status: AC
  Administered 2015-04-08: 40 meq via ORAL
  Filled 2015-04-08: qty 2

## 2015-04-08 NOTE — Progress Notes (Signed)
[facility] PROGRESS NOTE   [name] YNW:[id number] DOB: [birth year] DOA: 04/02/2015 PCP: Gwendolyn Grant, MD  HPI/Subjective: Feeling better; afebrile and with improvement in her breathing. Denies CP. Still requiring 4-5 L of oxygen supplementation and she is desaturating on exertion.   Assessment/Plan: Principal Problem:   COPD exacerbation Active Problems:   PULMONARY SARCOIDOSIS   Essential hypertension   Obstructive sleep apnea   Diabetes type 2, uncontrolled   Chronic diastolic CHF (congestive heart failure)   Hypoxia   Pulmonary HTN    COPD exacerbation The patient is presenting with complains of significantly worsening SOB and hypoxia. Chest x-ray showed no acute events, finding consistent with chronic sarcoidosis and bronchiectasis. Most likely her presentation appears to be multifactorial: secondary to COPD flareup and pulmonary HTN with acute on chronic diastolic heart failure. Supportive management with appropriate bronchodilators, mucolytics, antitussives and oxygen. Continue slowly tapering prednisone, PO antibiotics and nebulizer treatments.  Acute on chronic respiratory failure with hypoxia Patient has chronic respiratory failure on 3 L of oxygen at home; currently requiring 4-5L and with desaturation on exertion. With above-mentioned symptoms she developed hypoxia with oxygen of 81%, increased oxygen flow to 4 L; This is likely secondary to her COPD exacerbation along with PAH, continue above management. Try to wean oxygen down to her home dose as tolerated. Will add IS and flutter rvalve  Pulmonary hypertension, severe Cardiac cath was done recently and showed moderate elevation of right heart filling pressure, suggesting R > L heart failure. Plan is to start macitenan as per cardiology rec's IV diuresis transitioned to PO torsemide today 7/14; will follow clinical response and follow rec's  Chronic pain syndrome will resume zanaflex and  continue PRN oxycodone  Chronic diastolic CHF Seen by cardiology, not overtly overloaded, Cardiology ok to transition to PO torsemide Will follow rec's  [place] Per cardiology rec's, patient to be started on [employer] as approved it and copay is $3.60 Likely related to pulmonary hypertension from sarcoidosis.  Obesity hypoventilation syndrome. Patient mentions she is using BiPAP daily at bedtime on a regular basis. We will continue the same while inpatient.  Hyponatremia This is pseudohyponatremia secondary to hyperglycemia Sodium 135 now Will monitor  Diabetes mellitus, insulin-dependent, uncontrolled. -Last A1c in March 26 was 9.4, patient placed on insulin sliding scale, lantus and carbohydrate modified diet. -Worsening of CBGs secondary to steroids; will adjust insulin therapy as needed -expecting improvement on CBG's with steroids tapering  Hoarseness of voice Likely upper respiratory tract infection and cough  Continue tx with steroids, loratadine and antibiotics No PND reported  Hyperkalemia Potassium 6.5 on admission Received calcium gluconate and 30 mg of Kayexalate K WNL now will monitor electrolytes, especially with ongoing diuresis   Code Status: Full Code Family Communication: Plan discussed with the patient. Disposition Plan: Remains inpatient Diet: Diet heart healthy/carb modified Room service appropriate?: Yes; Fluid consistency:: Thin  Consultants:  None  Procedures:  None  Antibiotics:  Levofloxacin   Objective: Filed Vitals:   04/08/15 1337  BP: 124/67  Pulse: 85  Temp: 98.4 F (36.9 C)  Resp:     Intake/Output Summary (Last 24 hours) at 04/08/15 1745 Last data filed at 04/08/15 1740  Gross per 24 hour  Intake   2587 ml  Output   9850 ml  Net  -7263 ml   Filed Weights   04/06/15 2113 04/08/15 0657  Weight: 131.271 kg (289 lb 6.4 oz) 130.772 kg (288 lb 4.8 oz)    Exam: General: Alert and  awake, oriented x3, not in  any acute distress. No fever, no CP and with some improvement in her breathing. Still with desaturation on exertion and requiring 4-5L of oxygen HEENT: anicteric sclera, pupils reactive to light and accommodation, EOMI, hoarse  CVS: S1-S2 clear, no murmur, rubs or gallops, RRR Chest: improved air movement, no wheezing, no rales; no frank crackles appreciated; but distant BS on auscultation  Abdomen: soft nontender, nondistended, normal bowel sounds, no organomegaly Extremities: no cyanosis, clubbing or edema noted bilaterally Neuro: Cranial nerves II-XII intact, no focal neurological deficits  Data Reviewed: Basic Metabolic Panel:  Recent Labs Lab 04/04/15 0224 04/04/15 1027 04/05/15 0350 04/06/15 0315 04/07/15 0630 04/08/15 0409  NA 128* 127* 131* 136 135 133*  K 6.5* 5.9* 4.5 3.7 3.8 3.8  CL 91* 90* 92* 90* 91* 87*  CO2 27 27 29  36* 34* 35*  GLUCOSE 420* 530* 310* 194* 257* 219*  BUN 20 20 25* 23* 20 20  CREATININE 1.23* 1.22* 1.18* 1.05* 0.97 1.03*  CALCIUM 9.4 9.0 9.2 9.2 8.8* 9.7  MG 2.7*  --   --   --   --   --    CBC:  Recent Labs Lab 04/02/15 2006  04/04/15 0224 04/05/15 0350 04/06/15 0315 04/07/15 0630 04/08/15 0409  WBC 8.5  < > 14.9* 9.3 8.1 6.9 8.8  NEUTROABS 6.0  --   --   --   --   --   --   HGB 13.7  < > 12.6 12.9 12.7 13.0 14.6  HCT 42.6  < > 39.3 40.8 39.8 40.4 44.8  MCV 78.5  < > 78.4 79.7 78.5 79.2 77.9*  PLT 143*  < > 141* 138* 160 164 171  < > = values in this interval not displayed. BNP (last 3 results)  Recent Labs  12/13/14 0820 12/18/14 1846 03/19/15 0441  BNP 29.7 231.0* 61.9    ProBNP (last 3 results)  Recent Labs  04/17/14 0120 07/20/14 1817  PROBNP 44.7 39.7    CBG:  Recent Labs Lab 04/07/15 1655 04/07/15 2122 04/08/15 0654 04/08/15 1122 04/08/15 1614  GLUCAP 459* 400* 216* 217* 348*    Micro Recent Results (from the past 240 hour(s))  MRSA PCR Screening     Status: None   Collection Time: 04/03/15  5:13 AM   Result Value Ref Range Status   MRSA by PCR NEGATIVE NEGATIVE Final    Comment:        The GeneXpert MRSA Assay (FDA approved for NASAL specimens only), is one component of a comprehensive MRSA colonization surveillance program. It is not intended to diagnose MRSA infection nor to guide or monitor treatment for MRSA infections.      Studies: No results found.  Scheduled Meds: . ALPRAZolam  1 mg Oral TID  . aspirin EC  81 mg Oral Daily  . enoxaparin (LOVENOX) injection  60 mg Subcutaneous Q24H  . fluconazole  100 mg Oral Daily  . guaiFENesin  1,200 mg Oral BID  . insulin aspart  0-20 Units Subcutaneous TID WC  . insulin aspart  6 Units Subcutaneous TID WC  . insulin glargine  50 Units Subcutaneous BID  . ipratropium-albuterol  3 mL Nebulization TID  . levofloxacin  750 mg Oral Daily  . loratadine  10 mg Oral QHS  . multivitamin with minerals  1 tablet Oral Q Mon  . omega-3 acid ethyl esters  1 capsule Oral Daily  . pantoprazole  40 mg Oral Daily  . [START  ON 04/09/2015] predniSONE  20 mg Oral Q breakfast  . spironolactone  25 mg Oral BID  . torsemide  60 mg Oral BID  . venlafaxine XR  37.5 mg Oral Q breakfast   Continuous Infusions:     Time spent: 35 minutes    Barton Dubois  [facility] Pager [phone] If 7PM-7AM, please contact night-coverage at [email or web address], password [facility] 04/08/2015, 5:45 PM  LOS: 6 days

## 2015-04-08 NOTE — Progress Notes (Signed)
Pt to call RN when ready for CPAP.

## 2015-04-08 NOTE — Care Management Important Message (Signed)
Important Message  Patient Details  Name: [name] MRN: [id number] Date of Birth: [birth year]   Medicare Important Message Given:  Yes-third notification given    Pricilla Handler 04/08/2015, 2:30 PM

## 2015-04-08 NOTE — Progress Notes (Signed)
Patient ID: PREET MANGANO, female   DOB: 14-May-1966, 49 y.o.   MRN: [id number]   SUBJECTIVE: Breathing better.  Good diuresis again yesterday.   Scheduled Meds: . ALPRAZolam  1 mg Oral TID  . aspirin EC  81 mg Oral Daily  . enoxaparin (LOVENOX) injection  60 mg Subcutaneous Q24H  . guaiFENesin  1,200 mg Oral BID  . insulin aspart  0-20 Units Subcutaneous TID WC  . insulin aspart  6 Units Subcutaneous TID WC  . insulin glargine  50 Units Subcutaneous BID  . ipratropium-albuterol  3 mL Nebulization TID  . levofloxacin  750 mg Oral Daily  . loratadine  10 mg Oral QHS  . multivitamin with minerals  1 tablet Oral Q Mon  . omega-3 acid ethyl esters  1 capsule Oral Daily  . pantoprazole  40 mg Oral Daily  . predniSONE  40 mg Oral Q breakfast  . sodium polystyrene  15 g Oral Once  . spironolactone  25 mg Oral BID  . torsemide  60 mg Oral BID  . venlafaxine XR  37.5 mg Oral Q breakfast   Continuous Infusions:  PRN Meds:.acetaminophen **OR** acetaminophen, alum & mag hydroxide-simeth, gabapentin, guaiFENesin-dextromethorphan, hydrOXYzine, ipratropium-albuterol, menthol-cetylpyridinium, ondansetron **OR** ondansetron (ZOFRAN) IV, oxyCODONE, tiZANidine    Filed Vitals:   04/07/15 1414 04/07/15 2056 04/07/15 2127 04/08/15 0657  BP:   135/75 155/74  Pulse:   84 84  Temp:   97.5 F (36.4 C) 98.1 F (36.7 C)  TempSrc:   Oral Oral  Resp:   18 18  Height:      Weight:    288 lb 4.8 oz (130.772 kg)  SpO2: 98% 97% 100% 98%    Intake/Output Summary (Last 24 hours) at 04/08/15 0824 Last data filed at 04/08/15 0121  Gross per 24 hour  Intake   1780 ml  Output   8450 ml  Net  -6670 ml    LABS: Basic Metabolic Panel:  Recent Labs  04/07/15 0630 04/08/15 0409  NA 135 133*  K 3.8 3.8  CL 91* 87*  CO2 34* 35*  GLUCOSE 257* 219*  BUN 20 20  CREATININE 0.97 1.03*  CALCIUM 8.8* 9.7   Liver Function Tests: No results for input(s): AST, ALT, ALKPHOS, BILITOT, PROT, ALBUMIN in the  last 72 hours. No results for input(s): LIPASE, AMYLASE in the last 72 hours. CBC:  Recent Labs  04/07/15 0630 04/08/15 0409  WBC 6.9 8.8  HGB 13.0 14.6  HCT 40.4 44.8  MCV 79.2 77.9*  PLT 164 171   Cardiac Enzymes: No results for input(s): CKTOTAL, CKMB, CKMBINDEX, TROPONINI in the last 72 hours. BNP: Invalid input(s): POCBNP D-Dimer: No results for input(s): DDIMER in the last 72 hours. Hemoglobin A1C: No results for input(s): HGBA1C in the last 72 hours. Fasting Lipid Panel: No results for input(s): CHOL, HDL, LDLCALC, TRIG, CHOLHDL, LDLDIRECT in the last 72 hours. Thyroid Function Tests: No results for input(s): TSH, T4TOTAL, T3FREE, THYROIDAB in the last 72 hours.  Invalid input(s): FREET3 Anemia Panel: No results for input(s): VITAMINB12, FOLATE, FERRITIN, TIBC, IRON, RETICCTPCT in the last 72 hours.  RADIOLOGY: Dg Chest 2 View  04/02/2015   CLINICAL DATA:  Patient with shortness of breath. History of pulmonary hypertension.  EXAM: CHEST  2 VIEW  COMPARISON:  Chest radiograph 03/22/2015  FINDINGS: Monitoring leads overlie the patient. Stable cardiac and mediastinal contours. Unchanged bilateral coarse interstitial opacities predominately involving the lower lungs bilaterally. No superimposed consolidative opacity. No pleural effusion  or pneumothorax. Regional skeleton is unremarkable.  IMPRESSION: No acute cardiopulmonary process.  Unchanged basilar scarring compatible with sarcoidosis.   Electronically Signed   By: Lovey Newcomer M.D.   On: 04/02/2015 19:14   Dg Chest 2 View  03/22/2015   CLINICAL DATA:  Left-sided chest pain. Hypertension. History of sarcoidosis.  EXAM: CHEST  2 VIEW  COMPARISON:  CT chest 03/16/2015. PA and lateral chest 10/18/2014. Single view of the chest 03/15/2015.  FINDINGS: The lungs are emphysematous with unchanged patchy basilar opacities likely due to scar or atelectasis. No consolidative process, pneumothorax or effusion is identified. Heart size is  enlarged.  IMPRESSION: Cardiomegaly without acute disease.  Emphysema. Basilar scarring compatible sarcoidosis appears unchanged.   Electronically Signed   By: Inge Rise M.D.   On: 03/22/2015 18:55   Ct Angio Chest Pe W/cm &/or Wo Cm  03/16/2015   CLINICAL DATA:  Acute onset of shortness of breath and elevated D-dimer. Initial encounter.  EXAM: CT ANGIOGRAPHY CHEST WITH CONTRAST  TECHNIQUE: Multidetector CT imaging of the chest was performed using the standard protocol during bolus administration of intravenous contrast. Multiplanar CT image reconstructions and MIPs were obtained to evaluate the vascular anatomy.  CONTRAST:  63mL OMNIPAQUE IOHEXOL 350 MG/ML SOLN  COMPARISON:  Chest radiograph performed 03/15/2015, and CT of the chest performed 12/21/2014  FINDINGS: There is no evidence of significant pulmonary embolus.  Nodular areas of airspace opacification in the lung bases are stable from March, and likely reflect prior progression of the patient's pulmonary sarcoidosis. No superimposed focal airspace consolidation is seen. There is underlying centrilobular and paraseptal emphysema, diffuse interstitial thickening, and bronchiectasis at the lung bases; these findings are stable from prior studies. No pleural effusion or pneumothorax is seen. No suspicious masses are identified.  The mediastinum is unremarkable in appearance. A borderline prominent 1.1 cm right paratracheal node is seen, and a 1.5 cm azygoesophageal recess node is noted. No pericardial effusion is identified. Small peribronchial nodes remain borderline normal in size. No axillary lymphadenopathy is seen. The visualized portions of the thyroid gland are unremarkable in appearance.  The visualized portions of the liver and spleen are unremarkable. The visualized portions of the pancreas, stomach, adrenal glands and kidneys are within normal limits.  No acute osseous abnormalities are seen.  Review of the MIP images confirms the above  findings.  IMPRESSION: 1. No evidence of significant pulmonary embolus. 2. Stable nodular airspace opacification at the lung bases, reflecting prior progression of the patient's pulmonary sarcoidosis. No superimposed focal airspace consolidation seen. 3. Underlying centrilobular and paraseptal emphysema, diffuse interstitial thickening and bronchiectasis at the lung bases, stable from prior studies. 4. Enlarged mediastinal nodes likely reflect the patient's sarcoidosis.   Electronically Signed   By: Garald Balding M.D.   On: 03/16/2015 03:54   Mr Brain Wo Contrast  03/18/2015   CLINICAL DATA:  49 year old female with severe headache. Initial encounter. Current history of sarcoidosis, obstructive sleep apnea.  EXAM: MRI HEAD WITHOUT CONTRAST  TECHNIQUE: Multiplanar, multiecho pulse sequences of the brain and surrounding structures were obtained without intravenous contrast.  COMPARISON:  Neck CT 02/03/2011.  FINDINGS: The examination had to be discontinued prior to completion due to patient factors.  Cerebral volume is normal. No restricted diffusion to suggest acute infarction. No midline shift, mass effect, evidence of mass lesion, ventriculomegaly, extra-axial collection or acute intracranial hemorrhage. Cervicomedullary junction and pituitary are within normal limits. Major intracranial vascular flow voids are within normal limits.  No T2* imaging.  Only sagittal T1 weighted imaging. Pearline Cables and white matter signal is within normal limits for age throughout the brain.  Grossly negative visualized internal auditory structures. Visualized paranasal sinuses and mastoids are clear. Orbits soft tissues appear normal. Grossly negative visualized cervical spine. Bone marrow signal appears normal.  IMPRESSION: 1. Negative noncontrast MRI appearance of the brain. 2.  The examination had to be discontinued prior to completion.   Electronically Signed   By: Genevie Ann M.D.   On: 03/18/2015 20:51   Dg Chest Portable 1  View  03/15/2015   CLINICAL DATA:  Shortness of breath, left chest pain ongoing for 2 days.  EXAM: PORTABLE CHEST - 1 VIEW  COMPARISON:  01/14/2015  FINDINGS: There is mild cardiomegaly. Bibasilar airspace opacities have increased since prior study. No effusions. No acute bony abnormality.  IMPRESSION: Increasing bibasilar opacities. There may be underlying chronic scarring. Cannot exclude superimposed edema or infection.   Electronically Signed   By: Rolm Baptise M.D.   On: 03/15/2015 21:06    PHYSICAL EXAM General: NAD Neck: Thick, JVP 7-8 cm, no thyromegaly or thyroid nodule.  Lungs: Distant breath sounds CV: Nondisplaced PMI.  Heart regular S1/S2, no S3/S4, no murmur.  No peripheral edema.  No carotid bruit.  Normal pedal pulses.  Abdomen: Soft, nontender, no hepatosplenomegaly, no distention.  Neurologic: Alert and oriented x 3.  Psych: Normal affect. Extremities: No clubbing or cyanosis.   TELEMETRY: Reviewed telemetry pt in NSR  ASSESSMENT AND PLAN: 49 yo with history of chronic respiratory failure on home oxygen and CPAP with OSA, COPD, and sarcoidosis presented with suspected COPD exacerbation.  1. 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 She has quit smoking on Chantix. She has OHS/OSA on oxygen during the day and CPAP at night. She also has biopsy-proven sarcoidosis. She has had some changes on chest CT indicative of possible sarcoidosis progression, but per last pulmonary note this does not seem to be her primary problem at this time. She has not been on steroids for sarcoidosis chronically. She is being treated this admission for COPD exacerbation and is now on prednisone. V/Q scan in 3/16 concerning for COPD versus chronic PE, but she has had several chest CTAs that are not indicative of acute or chronic PE.  - Treatment COPD exacerbation per primary service. - Continue oxygen and CPAP for OHS.  2. Pulmonary hypertension: Multiple  possible reasons for pulmonary hypertension. PA systolic pressure estimate 106 mmHg by last echo. [place] 6/16 showed severe pulmonary arterial hypertension with some pulmonary venous hypertension => RA 16, PA 70/32 mean 46, PCWP 20, PVR 5.5. There is definitely a contribution from COPD and OHS/OSA (group 3). However, there could be a contribution from sarcoidosis.   - Will start her on ERA (macitentan) after left heart filling pressure optimized as there is some evidence for ERA use in sarcoidosis-related PAH. I think we can start this today if she has been approved for the med, will check with pharmacy. 3. Acute on chronic diastolic CHF with prominent RV dysfunction: Severely reduced RV function on echo. R>L heart failure on cath in 6/16. She diuresed well yesterday, volume status looks better.  - Convert to torsemide 60 mg po bid.    Loralie Champagne 04/08/2015 8:24 AM

## 2015-04-09 ENCOUNTER — Other Ambulatory Visit: Payer: Self-pay

## 2015-04-09 DIAGNOSIS — N179 Acute kidney failure, unspecified: Secondary | ICD-10-CM

## 2015-04-09 DIAGNOSIS — B379 Candidiasis, unspecified: Secondary | ICD-10-CM | POA: Insufficient documentation

## 2015-04-09 LAB — CBC
HEMATOCRIT: 43.8 % (ref 36.0–46.0)
Hemoglobin: 14.1 g/dL (ref 12.0–15.0)
MCH: 25 pg — ABNORMAL LOW (ref 26.0–34.0)
MCHC: 32.2 g/dL (ref 30.0–36.0)
MCV: 77.8 fL — AB (ref 78.0–100.0)
PLATELETS: 227 10*3/uL (ref 150–400)
RBC: 5.63 MIL/uL — AB (ref 3.87–5.11)
RDW: 17.6 % — ABNORMAL HIGH (ref 11.5–15.5)
WBC: 9.8 10*3/uL (ref 4.0–10.5)

## 2015-04-09 LAB — BASIC METABOLIC PANEL
Anion gap: 14 (ref 5–15)
BUN: 25 mg/dL — AB (ref 6–20)
CALCIUM: 9.1 mg/dL (ref 8.9–10.3)
CO2: 34 mmol/L — AB (ref 22–32)
CREATININE: 1.29 mg/dL — AB (ref 0.44–1.00)
Chloride: 86 mmol/L — ABNORMAL LOW (ref 101–111)
GFR calc Af Amer: 56 mL/min — ABNORMAL LOW (ref >60)
GFR calc non Af Amer: 48 mL/min — ABNORMAL LOW (ref >60)
GLUCOSE: 190 mg/dL — AB (ref 65–99)
POTASSIUM: 3.8 mmol/L (ref 3.5–5.1)
Sodium: 134 mmol/L — ABNORMAL LOW (ref 135–145)

## 2015-04-09 LAB — GLUCOSE, CAPILLARY
GLUCOSE-CAPILLARY: 504 mg/dL — AB (ref 65–99)
GLUCOSE-CAPILLARY: 187 mg/dL — AB (ref 65–99)
GLUCOSE-CAPILLARY: 305 mg/dL — AB (ref 65–99)
Glucose-Capillary: 173 mg/dL — ABNORMAL HIGH (ref 65–99)
Glucose-Capillary: 362 mg/dL — ABNORMAL HIGH (ref 65–99)

## 2015-04-09 MED ORDER — TORSEMIDE 20 MG PO TABS
60.0000 mg | ORAL_TABLET | Freq: Two times a day (BID) | ORAL | Status: DC
  Administered 2015-04-09 – 2015-04-11 (×4): 60 mg via ORAL
  Filled 2015-04-09 (×6): qty 3

## 2015-04-09 MED ORDER — TORSEMIDE 20 MG PO TABS
40.0000 mg | ORAL_TABLET | Freq: Two times a day (BID) | ORAL | Status: DC
Start: 2015-04-09 — End: 2015-04-09
  Filled 2015-04-09 (×2): qty 2

## 2015-04-09 NOTE — Patient Outreach (Signed)
Chart reviewed for discharge planning. Patient remain in acute care with no discharge date indicated.  Plan: Will follow up with  on  Tuesday, July 19 to assess for disposition

## 2015-04-09 NOTE — Progress Notes (Signed)
I stopped in to give [name] some written material regarding PAH.  We also discussed briefly information regarding her PAH and I reminded her of upcoming appt at the [facility].  She was grateful for the information.

## 2015-04-09 NOTE — Progress Notes (Addendum)
[facility] PROGRESS NOTE   [name] NLZ:[id number] DOB: 1966-05-10 DOA: 04/02/2015 PCP: Gwendolyn Grant, MD  HPI/Subjective: Feeling better; afebrile and with improvement in her breathing. Denies CP. Still requiring 4L of oxygen supplementation. Aggressive diuresis overnight. Cr up to 1.29   Assessment/Plan: Principal Problem:   COPD exacerbation Active Problems:   PULMONARY SARCOIDOSIS   Essential hypertension   Obstructive sleep apnea   Diabetes type 2, uncontrolled   Chronic diastolic CHF (congestive heart failure)   Hypoxia   Pulmonary HTN  COPD exacerbation The patient is presenting with complains of significantly worsening SOB and hypoxia. Chest x-ray showed no acute events, finding consistent with chronic sarcoidosis and bronchiectasis. Most likely her presentation appears to be multifactorial: secondary to COPD flareup and pulmonary HTN with acute on chronic diastolic heart failure. Supportive management with appropriate bronchodilators, mucolytics, antitussives and oxygen. Continue slowly tapering prednisone and continue nebulizer treatments. Patient has finished antibiotic therapy for bronchiectasis.  Acute on chronic respiratory failure with hypoxia Patient has chronic respiratory failure on 3 L of oxygen at home; currently requiring 4-5L and with desaturation on exertion. With above-mentioned symptoms she developed hypoxia with oxygen of 81%, increased oxygen flow to 4 L; This is likely secondary to her COPD exacerbation along with PAH, continue above management. Try to wean oxygen down to her home dose as tolerated. Will continue IS and flutter rvalve  Pulmonary hypertension, severe Cardiac cath was done recently and showed moderate elevation of right heart filling pressure, suggesting R > L heart failure. Plan is to start macitenan as per cardiology rec's IV diuresis transitioned to PO torsemide today 7/14; patient with aggressive diuresis overnight  and increase of her creatinine.  Torsemide has been held in the morning with plan to resume 60 mg twice a day starting this evening (7/15).   Chronic pain syndrome will resume zanaflex and continue PRN oxycodone  Chronic diastolic CHF Seen by cardiology, not overtly overloaded, Cardiology ok to transition to PO torsemide; had aggressive diuresis overnight Will be looking for torsemide 60 mg twice a day as discharge regimen to control her volume status. Will follow any further rec's  [facility] Per cardiology rec's, patient to be started on [employer] as approved it and copay is $3.60 Likely related to pulmonary hypertension from sarcoidosis.  Obesity hypoventilation syndrome. Patient mentions she is using BiPAP daily at bedtime on a regular basis. We will continue the same while inpatient.  Hyponatremia This is pseudohyponatremia secondary to hyperglycemia Sodium 135 now Will monitor  Diabetes mellitus, insulin-dependent, uncontrolled. -Last A1c in March 26 was 9.4, patient placed on insulin sliding scale, lantus and carbohydrate modified diet. -Worsening of CBGs secondary to steroids; will adjust insulin therapy as needed -expecting improvement on CBG's with steroids tapering  Hoarseness of voice Likely upper respiratory tract infection and cough  Continue tx with steroids, loratadine and antibiotics No PND reported  Hyperkalemia Potassium 6.5 on admission Received calcium gluconate and 30 mg of Kayexalate K WNL now will monitor electrolytes, especially with ongoing diuresis  Acute kidney injury: Secondary to overt diuresis -Diuretics dose has been adjusted -Will follow basic metabolic panel in the morning  Yeast infection: Treated with fluconazole 3 days.   Code Status: Full Code Family Communication: Plan discussed with the patient. Disposition Plan: Remains inpatient Diet: Diet heart healthy/carb modified Room service appropriate?: Yes; Fluid consistency::  Thin  Consultants:  None  Procedures:  None  Antibiotics:  Levofloxacin   Objective: Filed Vitals:   04/09/15 1336  BP:  114/67  Pulse: 84  Temp: 98.5 F (36.9 C)  Resp: 18    Intake/Output Summary (Last 24 hours) at 04/09/15 1624 Last data filed at 04/09/15 1609  Gross per 24 hour  Intake   2161 ml  Output   6500 ml  Net  -4339 ml   Filed Weights   04/06/15 2113 04/08/15 0657 04/09/15 0753  Weight: 131.271 kg (289 lb 6.4 oz) 130.772 kg (288 lb 4.8 oz) 129.003 kg (284 lb 6.4 oz)    Exam: General: Alert and awake, oriented x3, not in any acute distress. No fever, no CP and with some improvement in her breathing. Aggressive diuresis overnight.  HEENT: anicteric sclera, pupils reactive to light and accommodation, EOMI, hoarse  CVS: S1-S2 clear, no murmur, rubs or gallops, RRR Chest: improved air movement, no wheezing, no rales; no frank crackles appreciated; but distant BS on auscultation  Abdomen: soft nontender, nondistended, normal bowel sounds, no organomegaly Extremities: no cyanosis, clubbing or edema noted bilaterally Neuro: Cranial nerves II-XII intact, no focal neurological deficits  Data Reviewed: Basic Metabolic Panel:  Recent Labs Lab 04/04/15 0224  04/05/15 0350 04/06/15 0315 04/07/15 0630 04/08/15 0409 04/09/15 0332  NA 128*  < > 131* 136 135 133* 134*  K 6.5*  < > 4.5 3.7 3.8 3.8 3.8  CL 91*  < > 92* 90* 91* 87* 86*  CO2 27  < > 29 36* 34* 35* 34*  GLUCOSE 420*  < > 310* 194* 257* 219* 190*  BUN 20  < > 25* 23* 20 20 25*  CREATININE 1.23*  < > 1.18* 1.05* 0.97 1.03* 1.29*  CALCIUM 9.4  < > 9.2 9.2 8.8* 9.7 9.1  MG 2.7*  --   --   --   --   --   --   < > = values in this interval not displayed. CBC:  Recent Labs Lab 04/02/15 2006  04/05/15 0350 04/06/15 0315 04/07/15 0630 04/08/15 0409 04/09/15 0332  WBC 8.5  < > 9.3 8.1 6.9 8.8 9.8  NEUTROABS 6.0  --   --   --   --   --   --   HGB 13.7  < > 12.9 12.7 13.0 14.6 14.1  HCT 42.6   < > 40.8 39.8 40.4 44.8 43.8  MCV 78.5  < > 79.7 78.5 79.2 77.9* 77.8*  PLT 143*  < > 138* 160 164 171 227  < > = values in this interval not displayed. BNP (last 3 results)  Recent Labs  12/13/14 0820 12/18/14 1846 03/19/15 0441  BNP 29.7 231.0* 61.9    ProBNP (last 3 results)  Recent Labs  04/17/14 0120 07/20/14 1817  PROBNP 44.7 39.7    CBG:  Recent Labs Lab 04/08/15 1614 04/08/15 2103 04/09/15 0639 04/09/15 1121 04/09/15 1608  GLUCAP 348* 504* 187* 173* 305*    Micro Recent Results (from the past 240 hour(s))  MRSA PCR Screening     Status: None   Collection Time: 04/03/15  5:13 AM  Result Value Ref Range Status   MRSA by PCR NEGATIVE NEGATIVE Final    Comment:        The GeneXpert MRSA Assay (FDA approved for NASAL specimens only), is one component of a comprehensive MRSA colonization surveillance program. It is not intended to diagnose MRSA infection nor to guide or monitor treatment for MRSA infections.      Studies: No results found.  Scheduled Meds: . ALPRAZolam  1 mg Oral TID  .  aspirin EC  81 mg Oral Daily  . enoxaparin (LOVENOX) injection  60 mg Subcutaneous Q24H  . fluconazole  100 mg Oral Daily  . guaiFENesin  1,200 mg Oral BID  . insulin aspart  0-20 Units Subcutaneous TID WC  . insulin aspart  6 Units Subcutaneous TID WC  . insulin glargine  50 Units Subcutaneous BID  . ipratropium-albuterol  3 mL Nebulization TID  . levofloxacin  750 mg Oral Daily  . loratadine  10 mg Oral QHS  . multivitamin with minerals  1 tablet Oral Q Mon  . omega-3 acid ethyl esters  1 capsule Oral Daily  . pantoprazole  40 mg Oral Daily  . predniSONE  20 mg Oral Q breakfast  . spironolactone  25 mg Oral BID  . torsemide  60 mg Oral BID  . venlafaxine XR  37.5 mg Oral Q breakfast   Continuous Infusions:     Time spent: 35 minutes    Barton Dubois  [facility] Pager [phone] If 7PM-7AM, please contact night-coverage at [email or web address],  password [facility] 04/09/2015, 4:24 PM  LOS: 7 days

## 2015-04-09 NOTE — Progress Notes (Signed)
Advanced Heart Failure Rounding Note    Subjective:    Denies SOB at rest, still having some with ambulation.  Had some chest pain yesterday while walking the halls, but states she felt much better after coughing up "2 chunks of phlegm". No further CP.  Says she has not peed any today so far, and is feeling a little dizzy on standing. Out 6.5 L yesterday.  Down 8 lbs since admit   Objective:   Weight Range: 284 lb 6.4 oz (129.003 kg) Body mass index is 43.25 kg/(m^2).   Vital Signs:   Temp:  [97.4 F (36.3 C)-98.4 F (36.9 C)] 97.4 F (36.3 C) (07/15 0836) Pulse Rate:  [80-87] 86 (07/15 0836) Resp:  [16-18] 18 (07/15 0836) BP: (112-133)/(62-86) 133/86 mmHg (07/15 0836) SpO2:  [83 %-100 %] 98 % (07/15 0912) Weight:  [284 lb 6.4 oz (129.003 kg)] 284 lb 6.4 oz (129.003 kg) (07/15 0753) Last BM Date: 04/07/15  Weight change: Filed Weights   04/06/15 2113 04/08/15 0657 04/09/15 0753  Weight: 289 lb 6.4 oz (131.271 kg) 288 lb 4.8 oz (130.772 kg) 284 lb 6.4 oz (129.003 kg)    Intake/Output:   Intake/Output Summary (Last 24 hours) at 04/09/15 0930 Last data filed at 04/09/15 0909  Gross per 24 hour  Intake   2926 ml  Output   7750 ml  Net  -4824 ml     Physical Exam: General: Pleasant, NAD. Neuro: Alert and oriented X 3. Moves all extremities spontaneously. Psych: Normal affect. HEENT: Normal Neck: Thick, JVD 6-7, No thyromegaly or nodules appreciated. Lungs: Resp regular and unlabored, CTA. Heart: RRR no s3, s4, or murmurs appreciated. Abdomen: Soft, non-tender, non-distended, BS + x 4.  Extremities: No clubbing, cyanosis. 0-Trace edema. DP/PT/Radials 2+ and equal bilaterally.   Telemetry: NSR 80s  Labs: CBC  Recent Labs  04/08/15 0409 04/09/15 0332  WBC 8.8 9.8  HGB 14.6 14.1  HCT 44.8 43.8  MCV 77.9* 77.8*  PLT 171 814   Basic Metabolic Panel  Recent Labs  04/08/15 0409 04/09/15 0332  NA 133* 134*  K 3.8 3.8  CL 87* 86*  CO2 35*  34*  GLUCOSE 219* 190*  BUN 20 25*  CALCIUM 9.7 9.1   Liver Function Tests No results for input(s): AST, ALT, ALKPHOS, BILITOT, PROT, ALBUMIN in the last 72 hours. No results for input(s): LIPASE, AMYLASE in the last 72 hours. Cardiac Enzymes No results for input(s): CKTOTAL, CKMB, CKMBINDEX, TROPONINI in the last 72 hours.  BNP: BNP (last 3 results)  Recent Labs  12/13/14 0820 12/18/14 1846 03/19/15 0441  BNP 29.7 231.0* 61.9    ProBNP (last 3 results)  Recent Labs  04/17/14 0120 07/20/14 1817  PROBNP 44.7 39.7     D-Dimer No results for input(s): DDIMER in the last 72 hours. Hemoglobin A1C No results for input(s): HGBA1C in the last 72 hours. Fasting Lipid Panel No results for input(s): CHOL, HDL, LDLCALC, TRIG, CHOLHDL, LDLDIRECT in the last 72 hours. Thyroid Function Tests No results for input(s): TSH, T4TOTAL, T3FREE, THYROIDAB in the last 72 hours.  Invalid input(s): FREET3  Other results:     Imaging/Studies:   No results found.  Latest Echo  Latest Cath   Medications:     Scheduled Medications: . ALPRAZolam  1 mg Oral TID  . aspirin EC  81 mg Oral Daily  . enoxaparin (LOVENOX) injection  60 mg Subcutaneous Q24H  . fluconazole  100 mg Oral Daily  . guaiFENesin  1,200 mg Oral BID  . insulin aspart  0-20 Units Subcutaneous TID WC  . insulin aspart  6 Units Subcutaneous TID WC  . insulin glargine  50 Units Subcutaneous BID  . ipratropium-albuterol  3 mL Nebulization TID  . levofloxacin  750 mg Oral Daily  . loratadine  10 mg Oral QHS  . multivitamin with minerals  1 tablet Oral Q Mon  . omega-3 acid ethyl esters  1 capsule Oral Daily  . pantoprazole  40 mg Oral Daily  . predniSONE  20 mg Oral Q breakfast  . spironolactone  25 mg Oral BID  . torsemide  60 mg Oral BID  . venlafaxine XR  37.5 mg Oral Q breakfast     Infusions:     PRN Medications:  acetaminophen **OR** acetaminophen, alum & mag hydroxide-simeth, gabapentin,  guaiFENesin-dextromethorphan, hydrOXYzine, ipratropium-albuterol, menthol-cetylpyridinium, ondansetron **OR** ondansetron (ZOFRAN) IV, oxyCODONE, tiZANidine   Assessment/Plan   49 yo with history of chronic respiratory failure on home oxygen and CPAP with OSA, COPD, and sarcoidosis presented with suspected COPD exacerbation.   1. 3. Acute on chronic diastolic CHF with prominent RV dysfunction: Severely reduced RV function on echo. R>L heart failure on cath in 6/16.  - With net output of 26L seems a little dry on exam.    - Will hold morning torsemide and give 40 mg this evening with Cr ^, heme concentration, and lightheadedness. - K 3.8, Mg 2.7 (04/04/15) - Cr 1.29, will follow with diuresis - No BB with chronic lung disease 2. Chronic hypoxemic respiratory failure:  -  COPD w/ obstruction on PFTs.Being treat for exacerbation and on prednisone. - OHS/OSA on O2 during the day and CPAP night. - + sarcoidosis by biopsy. Chest CT ?progression - V/Q scan in 3/16 concerning for COPD versus chronic PE, Repeats CTAs negative for PE.  - Treatment COPD exacerbation per primary service. - Continue oxygen and CPAP for OHS.  - No longer smoking; on Chantix. 3. Pulmonary hypertension: - Multifactorial with CHF, COPD, OHS/OSA, and Sarcoidosis - PA systolic pressure estimate 106 mmHg by last echo. - RHC 6/16 showed severe pulmonary arterial hypertension with some pulmonary venous hypertension => RA 16, PA 70/32 mean 46, PCWP 20, PVR 5.5.   - Awaiting start of ERA (macitentan), has been approved. - There is some evidence for ERA use in sarcoidosis-related PAH. I think we can start this today if she has been approved for the med, will check with pharmacy. 4. AKI on CKD stage 2 - Cr 0.97 -> 1.09 -> 1.29 - Will follow closely with diuresis. - May be dry.  Will consult with MD about decrease of Diuresis.   Length of Stay: 7   Shirley Friar PA-C 04/09/2015, 9:30 AM  Advanced  Heart Failure Team Pager [phone] (M-F; 7a - 4p)  Please contact [facility] for night-coverage after hours (4p -7a ) and weekends on [email or web address]  Patient seen with PA, agree with the above note.  Very vigorous diuresis yesterday with creatinine higher.  Held am dose torsemide.  Will restart torsemide 60 mg bid this evening.  She was on torsemide 50 mg bid at home prior to admission.    Opsumit has been approved for her, will be sent to her house.   Loralie Champagne 04/09/2015 2:38 PM

## 2015-04-09 NOTE — Progress Notes (Signed)
Pt. States she can place cpap on herself. RT informed pt. To notify for any assistance.

## 2015-04-09 NOTE — Progress Notes (Signed)
Inpatient Diabetes Program Recommendations  AACE/ADA: New Consensus Statement on Inpatient Glycemic Control (2013)  Target Ranges:  Prepandial:   less than 140 mg/dL      Peak postprandial:   less than 180 mg/dL (1-2 hours)      Critically ill patients:  140 - 180 mg/dL   Results for [name], [name] (MRN [id number]) as of 04/09/2015 08:26  Ref. Range 04/08/2015 06:54 04/08/2015 11:22 04/08/2015 16:14 04/08/2015 21:03 04/09/2015 06:39  Glucose-Capillary Latest Ref Range: 65-99 mg/dL 216 (H) 217 (H) 348 (H) 504 (H) 187 (H)   Diabetes history: DM 2 Outpatient Diabetes medications: Lantus 40 units BID, Novolog 5 units TID meal coverage, Amaryl 1 mg Daily Current orders for Inpatient glycemic control: Lantus 50 units BID, Novolog 6 units TID meal coverage, Novolog Resistant scale  Inpatient Diabetes Program Recommendations  Correction (SSI): Consider ordering Novolog bedtime scale (0-5 units QHS). Insulin - Meal Coverage: Glucose increased into the 300-500 range around meals. Please increase meal coverage to 8-10 units TID.  Thanks,  Tama Headings RN, MSN, [facility] 2 Inpatient Diabetes Coordinator Team Pager [phone]

## 2015-04-10 LAB — GLUCOSE, CAPILLARY
GLUCOSE-CAPILLARY: 250 mg/dL — AB (ref 65–99)
Glucose-Capillary: 182 mg/dL — ABNORMAL HIGH (ref 65–99)
Glucose-Capillary: 159 mg/dL — ABNORMAL HIGH (ref 65–99)
Glucose-Capillary: 354 mg/dL — ABNORMAL HIGH (ref 65–99)

## 2015-04-10 LAB — BASIC METABOLIC PANEL
ANION GAP: 9 (ref 5–15)
BUN: 23 mg/dL — ABNORMAL HIGH (ref 6–20)
CALCIUM: 8.5 mg/dL — AB (ref 8.9–10.3)
CO2: 36 mmol/L — AB (ref 22–32)
CREATININE: 1.13 mg/dL — AB (ref 0.44–1.00)
Chloride: 89 mmol/L — ABNORMAL LOW (ref 101–111)
GFR calc non Af Amer: 57 mL/min — ABNORMAL LOW (ref >60)
Glucose, Bld: 194 mg/dL — ABNORMAL HIGH (ref 65–99)
Potassium: 3.5 mmol/L (ref 3.5–5.1)
Sodium: 134 mmol/L — ABNORMAL LOW (ref 135–145)

## 2015-04-10 LAB — CBC
HCT: 44.8 % (ref 36.0–46.0)
Hemoglobin: 14.5 g/dL (ref 12.0–15.0)
MCH: 25.4 pg — AB (ref 26.0–34.0)
MCHC: 32.4 g/dL (ref 30.0–36.0)
MCV: 78.6 fL (ref 78.0–100.0)
PLATELETS: 233 10*3/uL (ref 150–400)
RBC: 5.7 MIL/uL — ABNORMAL HIGH (ref 3.87–5.11)
RDW: 17.7 % — AB (ref 11.5–15.5)
WBC: 8.6 10*3/uL (ref 4.0–10.5)

## 2015-04-10 MED ORDER — PREDNISONE 10 MG PO TABS
10.0000 mg | ORAL_TABLET | Freq: Every day | ORAL | Status: DC
  Administered 2015-04-11: 10 mg via ORAL
  Filled 2015-04-10 (×2): qty 1

## 2015-04-10 MED ORDER — MAGNESIUM SULFATE IN D5W 10-5 MG/ML-% IV SOLN
1.0000 g | Freq: Once | INTRAVENOUS | Status: AC
  Administered 2015-04-10: 1 g via INTRAVENOUS
  Filled 2015-04-10: qty 100

## 2015-04-10 NOTE — Progress Notes (Signed)
[facility] PROGRESS NOTE   [name] QZR:[id number] DOB: 24-Jul-1966 DOA: 04/02/2015 PCP: Gwendolyn Grant, MD  HPI/Subjective: Feeling ok. Complaining of severe muscle cramping. Another Aggressive diuresis overnight (above 3L neg again); afebrile and without CP. Still requiring 4L of oxygen supplementation. Cr 1.13 (7/16)  Assessment/Plan: Principal Problem:   COPD exacerbation Active Problems:   PULMONARY SARCOIDOSIS   Essential hypertension   Obstructive sleep apnea   Diabetes type 2, uncontrolled   Chronic diastolic CHF (congestive heart failure)   Hypoxia   Pulmonary HTN   Yeast infection   Acute kidney injury  COPD exacerbation The patient is presenting with complains of significantly worsening SOB and hypoxia. Chest x-ray showed no acute events, finding consistent with chronic sarcoidosis and bronchiectasis. Most likely her presentation appears to be multifactorial: secondary to COPD flareup and pulmonary HTN with acute on chronic diastolic heart failure. Supportive management with appropriate bronchodilators, mucolytics, antitussives and oxygen. Continue slowly tapering prednisone and continue nebulizer treatments. Patient has finished antibiotic therapy for bronchiectasis.  Acute on chronic respiratory failure with hypoxia Patient has chronic respiratory failure on 3 L of oxygen at home; currently requiring 4-5L and with desaturation on exertion. With above-mentioned symptoms she developed hypoxia with oxygen of 81%, increased oxygen flow to 4 L; This is likely secondary to her COPD exacerbation along with PAH, continue above management. Try to wean oxygen down to her home dose as tolerated. Will continue IS and flutter rvalve  Pulmonary hypertension, severe Cardiac cath was done recently and showed moderate elevation of right heart filling pressure, suggesting R > L heart failure. Plan is to start macitenan as per cardiology rec's IV diuresis transitioned  to PO torsemide today 7/14; patient with aggressive diuresis overnight and increase of her creatinine.  Torsemide has been held in the morning of 7/15 and resumed in the evening; still with aggressive diuresis. Per cardiology rec's will keep 60 mg BID today and follow labs in am to help deciding home dose before discharge   Chronic pain syndrome will resume zanaflex and continue PRN oxycodone  Chronic diastolic CHF Seen by cardiology, not overtly overloaded, Cardiology ok to transition to PO torsemide; had aggressive diuresis overnight Will be looking for torsemide 60 mg twice a day as discharge regimen to control her volume status; if renal function tolerate this dose. Will follow any further rec's from cardiology service  [facility] Per cardiology rec's, patient to be started on [employer] as approved it and copay is $3.60 Likely related to pulmonary hypertension from sarcoidosis.  Obesity hypoventilation syndrome. Patient mentions she is using BiPAP daily at bedtime on a regular basis. We will continue the same while inpatient.  Hyponatremia This is pseudohyponatremia secondary to hyperglycemia Sodium 135 now Will monitor  Diabetes mellitus, insulin-dependent, uncontrolled. -Last A1c in March 26 was 9.4, patient placed on insulin sliding scale, lantus and carbohydrate modified diet. -Worsening of CBGs secondary to steroids; will adjust insulin therapy as needed -expecting improvement on CBG's with steroids tapering  Hoarseness of voice Likely upper respiratory tract infection and cough  Continue tx with steroids, loratadine and antibiotics No PND reported  Hyperkalemia Potassium 6.5 on admission Received calcium gluconate and 30 mg of Kayexalate K WNL now will monitor electrolytes, especially with ongoing diuresis  Acute kidney injury: Secondary to overt diuresis -Diuretics dose has been adjusted -Will follow basic metabolic panel in the morning  Yeast infection:  Treated with fluconazole 3 days.   Code Status: Full Code Family Communication: Plan discussed with the  patient. Disposition Plan: Remains inpatient Diet: Diet heart healthy/carb modified Room service appropriate?: Yes; Fluid consistency:: Thin  Consultants:  None  Procedures:  None  Antibiotics:  Levofloxacin   Objective: Filed Vitals:   04/10/15 1500  BP: 118/56  Pulse: 77  Temp: 98.3 F (36.8 C)  Resp: 18    Intake/Output Summary (Last 24 hours) at 04/10/15 1552 Last data filed at 04/10/15 1500  Gross per 24 hour  Intake   1320 ml  Output   5925 ml  Net  -4605 ml   Filed Weights   04/08/15 0657 04/09/15 0753 04/10/15 0612  Weight: 130.772 kg (288 lb 4.8 oz) 129.003 kg (284 lb 6.4 oz) 129.493 kg (285 lb 7.7 oz)    Exam: General: Alert and awake, oriented x3, complaining of generalized muscle cramping and pain. No fever, no CP and with overall stable breathing. Aggressive diuresis overnight again.  HEENT: anicteric sclera, pupils reactive to light and accommodation, EOMI, hoarse  CVS: S1-S2 clear, no murmur, rubs or gallops, RRR Chest: improved air movement, no wheezing, no rales; no frank crackles appreciated; but distant BS on auscultation  Abdomen: soft nontender, nondistended, normal bowel sounds, no organomegaly Extremities: no cyanosis, clubbing or edema noted bilaterally Neuro: Cranial nerves II-XII intact, no focal neurological deficits  Data Reviewed: Basic Metabolic Panel:  Recent Labs Lab 04/04/15 0224  04/06/15 0315 04/07/15 0630 04/08/15 0409 04/09/15 0332 04/10/15 0318  NA 128*  < > 136 135 133* 134* 134*  K 6.5*  < > 3.7 3.8 3.8 3.8 3.5  CL 91*  < > 90* 91* 87* 86* 89*  CO2 27  < > 36* 34* 35* 34* 36*  GLUCOSE 420*  < > 194* 257* 219* 190* 194*  BUN 20  < > 23* 20 20 25* 23*  CREATININE 1.23*  < > 1.05* 0.97 1.03* 1.29* 1.13*  CALCIUM 9.4  < > 9.2 8.8* 9.7 9.1 8.5*  MG 2.7*  --   --   --   --   --   --   < > = values in this  interval not displayed. CBC:  Recent Labs Lab 04/06/15 0315 04/07/15 0630 04/08/15 0409 04/09/15 0332 04/10/15 0318  WBC 8.1 6.9 8.8 9.8 8.6  HGB 12.7 13.0 14.6 14.1 14.5  HCT 39.8 40.4 44.8 43.8 44.8  MCV 78.5 79.2 77.9* 77.8* 78.6  PLT 160 164 171 227 233   BNP (last 3 results)  Recent Labs  12/13/14 0820 12/18/14 1846 03/19/15 0441  BNP 29.7 231.0* 61.9    ProBNP (last 3 results)  Recent Labs  04/17/14 0120 07/20/14 1817  PROBNP 44.7 39.7    CBG:  Recent Labs Lab 04/09/15 1121 04/09/15 1608 04/09/15 2125 04/10/15 0551 04/10/15 1159  GLUCAP 173* 305* 362* 182* 159*    Micro Recent Results (from the past 240 hour(s))  MRSA PCR Screening     Status: None   Collection Time: 04/03/15  5:13 AM  Result Value Ref Range Status   MRSA by PCR NEGATIVE NEGATIVE Final    Comment:        The GeneXpert MRSA Assay (FDA approved for NASAL specimens only), is one component of a comprehensive MRSA colonization surveillance program. It is not intended to diagnose MRSA infection nor to guide or monitor treatment for MRSA infections.      Studies: No results found.  Scheduled Meds: . ALPRAZolam  1 mg Oral TID  . aspirin EC  81 mg Oral Daily  . enoxaparin (  LOVENOX) injection  60 mg Subcutaneous Q24H  . guaiFENesin  1,200 mg Oral BID  . insulin aspart  0-20 Units Subcutaneous TID WC  . insulin aspart  6 Units Subcutaneous TID WC  . insulin glargine  50 Units Subcutaneous BID  . ipratropium-albuterol  3 mL Nebulization TID  . loratadine  10 mg Oral QHS  . magnesium sulfate 1 - 4 g bolus IVPB  1 g Intravenous Once  . multivitamin with minerals  1 tablet Oral Q Mon  . omega-3 acid ethyl esters  1 capsule Oral Daily  . pantoprazole  40 mg Oral Daily  . predniSONE  20 mg Oral Q breakfast  . spironolactone  25 mg Oral BID  . torsemide  60 mg Oral BID  . venlafaxine XR  37.5 mg Oral Q breakfast   Continuous Infusions:     Time spent: 35  minutes    Barton Dubois  [facility] Pager [phone] If 7PM-7AM, please contact night-coverage at [email or web address], password [facility] 04/10/2015, 3:52 PM  LOS: 8 days

## 2015-04-10 NOTE — Progress Notes (Signed)
Subjective:    Denies SOB at rest. Still 3L negative overnight - now out 30L total. Creatinine stable. Switched to po torsemide yesterday. Will need to observed urine output today and adjust accordingly.   Objective:   Weight Range: 285 lb 7.7 oz (129.493 kg) Body mass index is 43.42 kg/(m^2).   Vital Signs:   Temp:  [97.6 F (36.4 C)-98.5 F (36.9 C)] 97.6 F (36.4 C) (07/16 0612) Pulse Rate:  [77-84] 77 (07/16 0612) Resp:  [18] 18 (07/16 0612) BP: (112-129)/(60-89) 129/89 mmHg (07/16 0612) SpO2:  [96 %-100 %] 98 % (07/16 0816) FiO2 (%):  [36 %] 36 % (07/16 0816) Weight:  [285 lb 7.7 oz (129.493 kg)] 285 lb 7.7 oz (129.493 kg) (07/16 0612) Last BM Date: 04/07/15  Weight change: Filed Weights   04/08/15 0657 04/09/15 0753 04/10/15 0612  Weight: 288 lb 4.8 oz (130.772 kg) 284 lb 6.4 oz (129.003 kg) 285 lb 7.7 oz (129.493 kg)    Intake/Output:   Intake/Output Summary (Last 24 hours) at 04/10/15 1107 Last data filed at 04/10/15 0900  Gross per 24 hour  Intake   1662 ml  Output   4800 ml  Net  -3138 ml     Physical Exam: General: Pleasant, NAD. Neuro: Alert and oriented X 3. Moves all extremities spontaneously. Psych: Normal affect. HEENT: Normal Neck: Thick, JVD 6-7, No thyromegaly or nodules appreciated. Lungs: Resp regular and unlabored, CTA. Heart: RRR no s3, s4, or murmurs appreciated. Abdomen: Soft, non-tender, non-distended, BS + x 4.  Extremities: No clubbing, cyanosis. 0-Trace edema. DP/PT/Radials 2+ and equal bilaterally.   Telemetry: NSR 80s  Labs: CBC  Recent Labs  04/09/15 0332 04/10/15 0318  WBC 9.8 8.6  HGB 14.1 14.5  HCT 43.8 44.8  MCV 77.8* 78.6  PLT 227 841   Basic Metabolic Panel  Recent Labs  04/09/15 0332 04/10/15 0318  NA 134* 134*  K 3.8 3.5  CL 86* 89*  CO2 34* 36*  GLUCOSE 190* 194*  BUN 25* 23*  CALCIUM 9.1 8.5*   Liver Function Tests No results for input(s): AST, ALT, ALKPHOS, BILITOT, PROT,  ALBUMIN in the last 72 hours. No results for input(s): LIPASE, AMYLASE in the last 72 hours. Cardiac Enzymes No results for input(s): CKTOTAL, CKMB, CKMBINDEX, TROPONINI in the last 72 hours.  BNP: BNP (last 3 results)  Recent Labs  12/13/14 0820 12/18/14 1846 03/19/15 0441  BNP 29.7 231.0* 61.9    ProBNP (last 3 results)  Recent Labs  04/17/14 0120 07/20/14 1817  PROBNP 44.7 39.7     D-Dimer No results for input(s): DDIMER in the last 72 hours. Hemoglobin A1C No results for input(s): HGBA1C in the last 72 hours. Fasting Lipid Panel No results for input(s): CHOL, HDL, LDLCALC, TRIG, CHOLHDL, LDLDIRECT in the last 72 hours. Thyroid Function Tests No results for input(s): TSH, T4TOTAL, T3FREE, THYROIDAB in the last 72 hours.  Invalid input(s): FREET3  Other results:     Imaging/Studies:  No results found.  Latest Echo  Latest Cath   Medications:     Scheduled Medications: . ALPRAZolam  1 mg Oral TID  . aspirin EC  81 mg Oral Daily  . enoxaparin (LOVENOX) injection  60 mg Subcutaneous Q24H  . guaiFENesin  1,200 mg Oral BID  . insulin aspart  0-20 Units Subcutaneous TID WC  . insulin aspart  6 Units Subcutaneous TID WC  . insulin glargine  50 Units Subcutaneous BID  . ipratropium-albuterol  3 mL  Nebulization TID  . loratadine  10 mg Oral QHS  . multivitamin with minerals  1 tablet Oral Q Mon  . omega-3 acid ethyl esters  1 capsule Oral Daily  . pantoprazole  40 mg Oral Daily  . predniSONE  20 mg Oral Q breakfast  . spironolactone  25 mg Oral BID  . torsemide  60 mg Oral BID  . venlafaxine XR  37.5 mg Oral Q breakfast    Infusions:    PRN Medications: acetaminophen **OR** acetaminophen, alum & mag hydroxide-simeth, gabapentin, guaiFENesin-dextromethorphan, hydrOXYzine, ipratropium-albuterol, menthol-cetylpyridinium, ondansetron **OR** ondansetron (ZOFRAN) IV, oxyCODONE, tiZANidine   Assessment/Plan   49 yo with history of chronic  respiratory failure on home oxygen and CPAP with OSA, COPD, and sarcoidosis presented with suspected COPD exacerbation.   1. 3. Acute on chronic diastolic CHF with prominent RV dysfunction: Severely reduced RV function on echo. R>L heart failure on cath in 6/16.  - continue torsemide 60 mg po BID - reassess dose tomorrow  2. Chronic hypoxemic respiratory failure:  -  COPD w/ obstruction on PFTs.Being treat for exacerbation and on prednisone. - OHS/OSA on O2 during the day and CPAP night. - + sarcoidosis by biopsy. Chest CT ?progression - V/Q scan in 3/16 concerning for COPD versus chronic PE, Repeats CTAs negative for PE.  - Treatment COPD exacerbation per primary service. - Continue oxygen and CPAP for OHS.  - No longer smoking; on Chantix. 3. Pulmonary hypertension: - Multifactorial with CHF, COPD, OHS/OSA, and Sarcoidosis - PA systolic pressure estimate 106 mmHg by last echo. - RHC 6/16 showed severe pulmonary arterial hypertension with some pulmonary venous hypertension => RA 16, PA 70/32 mean 46, PCWP 20, PVR 5.5.   - ERA (macitentan) has been approved as outpatient 4. AKI on CKD stage 2 - creatinine appears stable, despite 3L additional output overnight.  Pixie Casino, MD, [facility] Attending Cardiologist [place] 04/10/2015 11:07 AM

## 2015-04-10 NOTE — Progress Notes (Signed)
Mrs Mahan complained of stomach pain and spasms. I gave PRN muscle relaxer, pain meds, and Zofran per pt request. She called her family and they called and asked if I could give her something for stomach pain and I informed them that I already did. When I asked her why she called her family instead of me she told me to call her doctor. MP Callahan notified and he said he wouldn't give her any more meds. When I went into the room to speak with her she said she was going to record our conversation and that she already informed the supervisor.

## 2015-04-11 DIAGNOSIS — J9621 Acute and chronic respiratory failure with hypoxia: Secondary | ICD-10-CM | POA: Insufficient documentation

## 2015-04-11 LAB — CBC
HEMATOCRIT: 44.9 % (ref 36.0–46.0)
Hemoglobin: 14.3 g/dL (ref 12.0–15.0)
MCH: 25.2 pg — ABNORMAL LOW (ref 26.0–34.0)
MCHC: 31.8 g/dL (ref 30.0–36.0)
MCV: 79 fL (ref 78.0–100.0)
PLATELETS: 229 10*3/uL (ref 150–400)
RBC: 5.68 MIL/uL — ABNORMAL HIGH (ref 3.87–5.11)
RDW: 17.6 % — AB (ref 11.5–15.5)
WBC: 8.4 10*3/uL (ref 4.0–10.5)

## 2015-04-11 LAB — GLUCOSE, CAPILLARY
GLUCOSE-CAPILLARY: 146 mg/dL — AB (ref 65–99)
Glucose-Capillary: 198 mg/dL — ABNORMAL HIGH (ref 65–99)

## 2015-04-11 LAB — MAGNESIUM: MAGNESIUM: 2.3 mg/dL (ref 1.7–2.4)

## 2015-04-11 LAB — BASIC METABOLIC PANEL
ANION GAP: 9 (ref 5–15)
BUN: 22 mg/dL — ABNORMAL HIGH (ref 6–20)
CHLORIDE: 89 mmol/L — AB (ref 101–111)
CO2: 35 mmol/L — ABNORMAL HIGH (ref 22–32)
CREATININE: 1.13 mg/dL — AB (ref 0.44–1.00)
Calcium: 8.7 mg/dL — ABNORMAL LOW (ref 8.9–10.3)
GFR calc Af Amer: >60 mL/min (ref >60)
GFR calc non Af Amer: 57 mL/min — ABNORMAL LOW (ref >60)
Glucose, Bld: 202 mg/dL — ABNORMAL HIGH (ref 65–99)
Potassium: 3.6 mmol/L (ref 3.5–5.1)
Sodium: 133 mmol/L — ABNORMAL LOW (ref 135–145)

## 2015-04-11 MED ORDER — GUAIFENESIN ER 600 MG PO TB12: 600.0000 mg | ORAL_TABLET | Freq: Two times a day (BID) | ORAL | Status: DC

## 2015-04-11 MED ORDER — PREDNISONE 10 MG PO TABS: 10.0000 mg | ORAL_TABLET | Freq: Every day | ORAL | Status: DC

## 2015-04-11 MED ORDER — SPIRONOLACTONE 25 MG PO TABS: 25.0000 mg | ORAL_TABLET | Freq: Every day | ORAL | Status: DC

## 2015-04-11 MED ORDER — MACITENTAN 10 MG PO TABS: 10.0000 mg | ORAL_TABLET | Freq: Every day | ORAL | Status: AC

## 2015-04-11 MED ORDER — TORSEMIDE 20 MG PO TABS: 60.0000 mg | ORAL_TABLET | Freq: Two times a day (BID) | ORAL | Status: DC

## 2015-04-11 MED ORDER — INSULIN GLARGINE 100 UNIT/ML SOLOSTAR PEN: 55.0000 [IU] | PEN_INJECTOR | Freq: Two times a day (BID) | SUBCUTANEOUS | Status: DC

## 2015-04-11 NOTE — Discharge Summary (Signed)
Physician Discharge Summary  [name] FTD:[id number] DOB: [birth year] DOA: 04/02/2015  PCP: Gwendolyn Grant, MD  Admit date: 04/02/2015 Discharge date: 04/11/2015  Time spent: >30 minutes  Recommendations for Outpatient Follow-up:  1. Reassess volume status and adjust medications as needed 2. Please reassess BMET to follow electrolytes and renal function 3. Close follow up to CBG's and A1C; further adjustment to hypoglycemic regimen as needed   Discharge Diagnoses:  Principal Problem:   COPD exacerbation Active Problems:   PULMONARY SARCOIDOSIS   Essential hypertension   Obstructive sleep apnea   Diabetes type 2, uncontrolled   Chronic diastolic CHF (congestive heart failure)   Hypoxia   Pulmonary HTN   Yeast infection   Acute kidney injury   Discharge Condition: stable and improved. Discharge home with instructions to follow with HF clinic in 1 week  Diet recommendation: low sodium and low carbohydrates diet   Filed Weights   04/09/15 0753 04/10/15 0612 04/11/15 0300  Weight: 129.003 kg (284 lb 6.4 oz) 129.493 kg (285 lb 7.7 oz) 127.4 kg (280 lb 13.9 oz)    History of present illness:  49 y.o. female with a history of COPD, CHF, Pulmonary HTN, Sarcoid, and DM2 who presents to the ED with complaints of worsening SOB and wheezing over the past 3 days. She denies any fevers or chills or Cough. She was found to have hypoxia in the ED to 81% and was placed on 4 liters of NCO2.   Hospital Course:  COPD exacerbation The patient is presenting with complains of significantly worsening SOB and hypoxia. Chest x-ray showed no acute events, finding consistent with chronic sarcoidosis and bronchiectasis. Most likely her presentation appears to be multifactorial: secondary to COPD flareup and pulmonary HTN with acute on chronic diastolic heart failure. Supportive management with appropriate bronchodilators, mucolytics, antitussives and oxygen. Continue slowly tapering  prednisone and continue nebulizer treatments. Patient has finished antibiotic therapy for bronchiectasis.  Acute on chronic respiratory failure with hypoxia Patient has chronic respiratory failure on 3 L of oxygen at home; currently requiring 4-5L and with desaturation on exertion. With above-mentioned symptoms she developed hypoxia with oxygen of 81%, increased oxygen flow to 4 L; This is likely secondary to her COPD exacerbation along with PAH, continue above management. Try to wean oxygen down to her home dose as tolerated. Will continue IS and flutter rvalve  Pulmonary hypertension, severe Cardiac cath was done recently and showed moderate elevation of right heart filling pressure, suggesting R > L heart failure. Plan is to start macitenan as per cardiology rec's Patient got IV diuresis for better optimization on LV pressure and volume At discharge will use torsemide 60mg  BID  Chronic pain syndrome will resume zanaflex and neurontin   Chronic diastolic CHF Seen by cardiology, not overtly overloaded, Cardiology recommended to adjust torsemide to 60mg  BID for better volume control and help with SOB. Low sodium diet, daily weight and continue use of oxygen  PAH Per cardiology rec's, patient to be started on Macitenan 10mg  daily Insurance as approved it and copay is $3.60 Likely related to sarcoidosis.  Obesity hypoventilation syndrome. Continue BiPAP daily at bedtime on a regular basis.  Hyponatremia This is pseudohyponatremia secondary to hyperglycemia Sodium 135 at discharge  Diabetes mellitus, insulin-dependent, uncontrolled. -Last A1c in March 26 was 9.4, patient placed on insulin sliding scale, lantus and carbohydrate modified diet. -lantus adjusted for better control -close outpatient follow up and further adjustments base on CBG fluctuation -advise to follow low carb diet  Hoarseness of voice Likely upper respiratory tract infection and cough  Continue tx with  steroids, loratadine and antibiotics No PND reported  Hyperkalemia Potassium 6.5 on admission Received calcium gluconate and 30 mg of Kayexalate K WNL now at discharge  Acute kidney injury: Secondary to overt diuresis -Diuretics dose has been adjusted -Cr trending down and renal function essentially at baseline (1.13) -will discharge home and patient will follow with heart failure clinic in 1 week  Yeast infection: Treated with fluconazole 3 days.  Procedures:  See below for x-ray reports   Consultations:  Cardiology   Discharge Exam: Filed Vitals:   04/11/15 0530  BP: 102/52  Pulse: 71  Temp: 98 F (36.7 C)  Resp: 18   General: Alert and awake, oriented x3, no further cramping. No fever, no CP and with overall stable breathing. Good renal function and electrolytes level at discharge  HEENT: anicteric sclera, pupils reactive to light and accommodation, EOMI, hoarse  CVS: S1-S2 clear, no murmur, rubs or gallops, RRR Chest: improved air movement, no wheezing, no rales; no frank crackles appreciated; but distant BS on auscultation  Abdomen: soft nontender, nondistended, normal bowel sounds, no organomegaly Extremities: no cyanosis, clubbing or edema noted bilaterally Neuro: Cranial nerves II-XII intact, no focal neurological deficits   Discharge Instructions   Discharge Instructions    Diet - low sodium heart healthy    Complete by:  As directed      Discharge instructions    Complete by:  As directed   Low sodium diet and low carbohydrates Take medications as prescribed Please watch weight on daily basis Follow with Dr Aundra Dubin as instructed in the outpatient setting for further adjustment on your medications     Increase activity slowly    Complete by:  As directed           Current Discharge Medication List    START taking these medications   Details  guaiFENesin (MUCINEX) 600 MG 12 hr tablet Take 1 tablet (600 mg total) by mouth 2 (two) times  daily. Qty: 60 tablet, Refills: 0    Macitentan 10 MG TABS Take 10 mg by mouth daily. Qty: 30 tablet, Refills: 1    predniSONE (DELTASONE) 10 MG tablet Take 1 tablet (10 mg total) by mouth daily with breakfast. Take 1 tablet daily for 3 days and stop prednisone Qty: 3 tablet, Refills: 0      CONTINUE these medications which have CHANGED   Details  Insulin Glargine (LANTUS SOLOSTAR) 100 UNIT/ML Solostar Pen Inject 55 Units into the skin 2 (two) times daily. Qty: 15 mL, Refills: 2    spironolactone (ALDACTONE) 25 MG tablet Take 1 tablet (25 mg total) by mouth daily. Qty: 30 tablet, Refills: 0    torsemide (DEMADEX) 20 MG tablet Take 3 tablets (60 mg total) by mouth 2 (two) times daily. Qty: 180 tablet, Refills: 0      CONTINUE these medications which have NOT CHANGED   Details  albuterol (PROVENTIL) (2.5 MG/3ML) 0.083% nebulizer solution INHALE CONTENTS OF 1 VIAL VIA NEBULIZER EVERY 6 HOURS AS NEEDED FOR WHEEZING OR SHORTNESS OF BREATH Qty: 1125 mL, Refills: 0    ALPRAZolam (XANAX) 1 MG tablet Take 1 tablet (1 mg total) by mouth 3 (three) times daily. Qty: 90 tablet, Refills: 3    aspirin EC 81 MG tablet Take 81 mg by mouth daily.    BIOTIN PO Take 1 capsule by mouth daily.    budesonide-formoterol (SYMBICORT) 160-4.5 MCG/ACT inhaler Inhale 2  puffs into the lungs 2 (two) times daily. Qty: 1 Inhaler, Refills: 3    cetirizine (ZYRTEC) 1 MG/ML syrup Take 10 mLs (10 mg total) by mouth at bedtime. Qty: 300 mL, Refills: 0    diclofenac sodium (VOLTAREN) 1 % GEL Apply 4 g topically 4 (four) times daily. Qty: 100 g, Refills: 0    EMBEDA 20-0.8 MG CPCR Take 1 tablet by mouth daily. Refills: 0    esomeprazole (NEXIUM) 40 MG capsule Take 40 mg by mouth 2 (two) times daily before a meal.    fluticasone (FLONASE) 50 MCG/ACT nasal spray Place 2 sprays into both nostrils daily. Qty: 16 g, Refills: 5    gabapentin (NEURONTIN) 300 MG capsule Take 300 mg by mouth daily as needed  (neuropathy pain).     glimepiride (AMARYL) 1 MG tablet Take 1 mg by mouth daily with breakfast.    hydrOXYzine (ATARAX/VISTARIL) 10 MG tablet TAKE 1 TABLET BY MOUTH THREE TIMES DAILY AS NEEDED FOR ITCHING Qty: 30 tablet, Refills: 11    insulin aspart (NOVOLOG FLEXPEN) 100 UNIT/ML FlexPen Inject 5 Units into the skin 3 (three) times daily with meals. Qty: 21 mL, Refills: 0    Multiple Vitamin (MULTIVITAMIN) capsule Take 1 capsule by mouth once a week. Mondays    omega-3 fish oil (MAXEPA) 1000 MG CAPS capsule Take 1 capsule by mouth daily.    potassium chloride SA (K-DUR,KLOR-CON) 20 MEQ tablet Take 2 tablets (40 mEq total) by mouth 2 (two) times daily. Qty: 180 tablet, Refills: 11   Associated Diagnoses: Acute on chronic diastolic CHF (congestive heart failure)    PROAIR HFA 108 (90 BASE) MCG/ACT inhaler INHALE 2 PUFFS BY MOUTH EVERY 6 HOURS AS NEEDED FOR WHEEZING OR SHORTNESS OF BREATH Qty: 8.5 g, Refills: 0    tiotropium (SPIRIVA HANDIHALER) 18 MCG inhalation capsule Place 1 capsule (18 mcg total) into inhaler and inhale daily. Qty: 30 capsule, Refills: 3    tiZANidine (ZANAFLEX) 2 MG tablet Take 2 mg by mouth 2 (two) times daily as needed for muscle spasms.  Refills: 3    venlafaxine XR (EFFEXOR XR) 37.5 MG 24 hr capsule Take 1 capsule (37.5 mg total) by mouth daily with breakfast. Qty: 30 capsule, Refills: 3      STOP taking these medications     varenicline (CHANTIX) 0.5 MG tablet        Allergies  Allergen Reactions  . Simvastatin Other (See Comments)    Severe leg pain and burning  . Sulfamethoxazole-Trimethoprim Nausea And Vomiting    Causes Projectile Vomiting  . Zolpidem Tartrate Other (See Comments)    Chest pain  . Azithromycin Other (See Comments)    Resistant to med  . Ceftin [Cefuroxime] Swelling and Other (See Comments)    MOUTH SWELLING  . Doxycycline Other (See Comments)    Resistant to med  . Latex Itching and Rash    Also burning sensations  .  Metformin And Related Diarrhea    Severe diarrhea  . Metolazone Other (See Comments)    MYALGIAS  . Metronidazole Nausea And Vomiting  . Ciprofloxacin Nausea Only  . Tramadol Itching and Nausea Only   Follow-up Information    Follow up with Loralie Champagne, MD. Go on 04/19/2015.   Specialty:  Cardiology   Why:  at 3:20pm in the [facility]--gate code [id number]--please bring all medications to appt   Contact information:   [address] [place] [address] [phone]  The results of significant diagnostics from this hospitalization (including imaging, microbiology, ancillary and laboratory) are listed below for reference.    Significant Diagnostic Studies: Dg Chest 2 View  04/02/2015   CLINICAL DATA:  Patient with shortness of breath. History of pulmonary hypertension.  EXAM: CHEST  2 VIEW  COMPARISON:  Chest radiograph 03/22/2015  FINDINGS: Monitoring leads overlie the patient. Stable cardiac and mediastinal contours. Unchanged bilateral coarse interstitial opacities predominately involving the lower lungs bilaterally. No superimposed consolidative opacity. No pleural effusion or pneumothorax. Regional skeleton is unremarkable.  IMPRESSION: No acute cardiopulmonary process.  Unchanged basilar scarring compatible with sarcoidosis.   Electronically Signed   By: Lovey Newcomer M.D.   On: 04/02/2015 19:14   Dg Chest 2 View  03/22/2015   CLINICAL DATA:  Left-sided chest pain. Hypertension. History of sarcoidosis.  EXAM: CHEST  2 VIEW  COMPARISON:  CT chest 03/16/2015. PA and lateral chest 10/18/2014. Single view of the chest 03/15/2015.  FINDINGS: The lungs are emphysematous with unchanged patchy basilar opacities likely due to scar or atelectasis. No consolidative process, pneumothorax or effusion is identified. Heart size is enlarged.  IMPRESSION: Cardiomegaly without acute disease.  Emphysema. Basilar scarring compatible sarcoidosis appears unchanged.    Electronically Signed   By: Inge Rise M.D.   On: 03/22/2015 18:55   Ct Angio Chest Pe W/cm &/or Wo Cm  03/16/2015   CLINICAL DATA:  Acute onset of shortness of breath and elevated D-dimer. Initial encounter.  EXAM: CT ANGIOGRAPHY CHEST WITH CONTRAST  TECHNIQUE: Multidetector CT imaging of the chest was performed using the standard protocol during bolus administration of intravenous contrast. Multiplanar CT image reconstructions and MIPs were obtained to evaluate the vascular anatomy.  CONTRAST:  66mL OMNIPAQUE IOHEXOL 350 MG/ML SOLN  COMPARISON:  Chest radiograph performed 03/15/2015, and CT of the chest performed 12/21/2014  FINDINGS: There is no evidence of significant pulmonary embolus.  Nodular areas of airspace opacification in the lung bases are stable from March, and likely reflect prior progression of the patient's pulmonary sarcoidosis. No superimposed focal airspace consolidation is seen. There is underlying centrilobular and paraseptal emphysema, diffuse interstitial thickening, and bronchiectasis at the lung bases; these findings are stable from prior studies. No pleural effusion or pneumothorax is seen. No suspicious masses are identified.  The mediastinum is unremarkable in appearance. A borderline prominent 1.1 cm right paratracheal node is seen, and a 1.5 cm azygoesophageal recess node is noted. No pericardial effusion is identified. Small peribronchial nodes remain borderline normal in size. No axillary lymphadenopathy is seen. The visualized portions of the thyroid gland are unremarkable in appearance.  The visualized portions of the liver and spleen are unremarkable. The visualized portions of the pancreas, stomach, adrenal glands and kidneys are within normal limits.  No acute osseous abnormalities are seen.  Review of the MIP images confirms the above findings.  IMPRESSION: 1. No evidence of significant pulmonary embolus. 2. Stable nodular airspace opacification at the lung bases,  reflecting prior progression of the patient's pulmonary sarcoidosis. No superimposed focal airspace consolidation seen. 3. Underlying centrilobular and paraseptal emphysema, diffuse interstitial thickening and bronchiectasis at the lung bases, stable from prior studies. 4. Enlarged mediastinal nodes likely reflect the patient's sarcoidosis.   Electronically Signed   By: Garald Balding M.D.   On: 03/16/2015 03:54   Mr Brain Wo Contrast  03/18/2015   CLINICAL DATA:  49 year old female with severe headache. Initial encounter. Current history of sarcoidosis, obstructive sleep apnea.  EXAM: MRI HEAD WITHOUT CONTRAST  TECHNIQUE: Multiplanar, multiecho pulse sequences of the brain and surrounding structures were obtained without intravenous contrast.  COMPARISON:  Neck CT 02/03/2011.  FINDINGS: The examination had to be discontinued prior to completion due to patient factors.  Cerebral volume is normal. No restricted diffusion to suggest acute infarction. No midline shift, mass effect, evidence of mass lesion, ventriculomegaly, extra-axial collection or acute intracranial hemorrhage. Cervicomedullary junction and pituitary are within normal limits. Major intracranial vascular flow voids are within normal limits.  No T2* imaging. Only sagittal T1 weighted imaging. Pearline Cables and white matter signal is within normal limits for age throughout the brain.  Grossly negative visualized internal auditory structures. Visualized paranasal sinuses and mastoids are clear. Orbits soft tissues appear normal. Grossly negative visualized cervical spine. Bone marrow signal appears normal.  IMPRESSION: 1. Negative noncontrast MRI appearance of the brain. 2.  The examination had to be discontinued prior to completion.   Electronically Signed   By: Genevie Ann M.D.   On: 03/18/2015 20:51   Dg Chest Portable 1 View  03/15/2015   CLINICAL DATA:  Shortness of breath, left chest pain ongoing for 2 days.  EXAM: PORTABLE CHEST - 1 VIEW  COMPARISON:   01/14/2015  FINDINGS: There is mild cardiomegaly. Bibasilar airspace opacities have increased since prior study. No effusions. No acute bony abnormality.  IMPRESSION: Increasing bibasilar opacities. There may be underlying chronic scarring. Cannot exclude superimposed edema or infection.   Electronically Signed   By: Rolm Baptise M.D.   On: 03/15/2015 21:06    Microbiology: Recent Results (from the past 240 hour(s))  MRSA PCR Screening     Status: None   Collection Time: 04/03/15  5:13 AM  Result Value Ref Range Status   MRSA by PCR NEGATIVE NEGATIVE Final    Comment:        The GeneXpert MRSA Assay (FDA approved for NASAL specimens only), is one component of a comprehensive MRSA colonization surveillance program. It is not intended to diagnose MRSA infection nor to guide or monitor treatment for MRSA infections.      Labs: Basic Metabolic Panel:  Recent Labs Lab 04/07/15 0630 04/08/15 0409 04/09/15 0332 04/10/15 0318 04/11/15 0337  NA 135 133* 134* 134* 133*  K 3.8 3.8 3.8 3.5 3.6  CL 91* 87* 86* 89* 89*  CO2 34* 35* 34* 36* 35*  GLUCOSE 257* 219* 190* 194* 202*  BUN 20 20 25* 23* 22*  CREATININE 0.97 1.03* 1.29* 1.13* 1.13*  CALCIUM 8.8* 9.7 9.1 8.5* 8.7*  MG  --   --   --   --  2.3   CBC:  Recent Labs Lab 04/07/15 0630 04/08/15 0409 04/09/15 0332 04/10/15 0318 04/11/15 0337  WBC 6.9 8.8 9.8 8.6 8.4  HGB 13.0 14.6 14.1 14.5 14.3  HCT 40.4 44.8 43.8 44.8 44.9  MCV 79.2 77.9* 77.8* 78.6 79.0  PLT 164 171 227 233 229    BNP (last 3 results)  Recent Labs  12/13/14 0820 12/18/14 1846 03/19/15 0441  BNP 29.7 231.0* 61.9    ProBNP (last 3 results)  Recent Labs  04/17/14 0120 07/20/14 1817  PROBNP 44.7 39.7    CBG:  Recent Labs Lab 04/10/15 1159 04/10/15 1659 04/10/15 2117 04/11/15 0653 04/11/15 1213  GLUCAP 159* 250* 354* 198* 146*    Signed:  Barton Dubois  [facility] 04/11/2015, 1:40 PM

## 2015-04-11 NOTE — Progress Notes (Signed)
Consulting cardiologist: Lyman Bishop MD Primary Cardiologist: Cleatis Polka MD  Cardiology Specific Problem List:  1. Hypertension 2. Acute on Chronic Diastolic CHF 3. Pulmonary Hypertension with Sarcoidosis   Subjective:    Sleeping soundly, hard to arouse. Wearing CPAP Muscle cramps last evening.   Objective:   Temp:  [97.6 F (36.4 C)-98.3 F (36.8 C)] 98 F (36.7 C) (07/17 0530) Pulse Rate:  [71-80] 71 (07/17 0530) Resp:  [18] 18 (07/17 0530) BP: (102-139)/(52-78) 102/52 mmHg (07/17 0530) SpO2:  [97 %-100 %] 100 % (07/17 0530) FiO2 (%):  [36 %] 36 % (07/16 1359) Weight:  [280 lb 13.9 oz (127.4 kg)] 280 lb 13.9 oz (127.4 kg) (07/17 0300) Last BM Date: 04/10/15  Filed Weights   04/09/15 0753 04/10/15 0612 04/11/15 0300  Weight: 284 lb 6.4 oz (129.003 kg) 285 lb 7.7 oz (129.493 kg) 280 lb 13.9 oz (127.4 kg)    Intake/Output Summary (Last 24 hours) at 04/11/15 0808 Last data filed at 04/11/15 0600  Gross per 24 hour  Intake   1240 ml  Output   3450 ml  Net  -2210 ml    Telemetry: NSR Sinus bradycardia   Exam:  General: No acute distress.  HEENT: Conjunctiva and lids normal, oropharynx clear.  Lungs: Clear to auscultation, nonlabored.  Cardiac: Mild elevated JVP or bruits. RRR, no gallop or rub.   Abdomen: Normoactive bowel sounds, nontender, nondistended.  Extremities: No pitting edema, distal pulses full.   Lab Results:  Basic Metabolic Panel:  Recent Labs Lab 04/09/15 0332 04/10/15 0318 04/11/15 0337  NA 134* 134* 133*  K 3.8 3.5 3.6  CL 86* 89* 89*  CO2 34* 36* 35*  GLUCOSE 190* 194* 202*  BUN 25* 23* 22*  CREATININE 1.29* 1.13* 1.13*  CALCIUM 9.1 8.5* 8.7*  MG  --   --  2.3    CBC:  Recent Labs Lab 04/09/15 0332 04/10/15 0318 04/11/15 0337  WBC 9.8 8.6 8.4  HGB 14.1 14.5 14.3  HCT 43.8 44.8 44.9  MCV 77.8* 78.6 79.0  PLT 227 233 229     Medications:   Scheduled Medications: . ALPRAZolam  1 mg Oral TID  .  aspirin EC  81 mg Oral Daily  . enoxaparin (LOVENOX) injection  60 mg Subcutaneous Q24H  . guaiFENesin  1,200 mg Oral BID  . insulin aspart  0-20 Units Subcutaneous TID WC  . insulin aspart  6 Units Subcutaneous TID WC  . insulin glargine  50 Units Subcutaneous BID  . ipratropium-albuterol  3 mL Nebulization TID  . loratadine  10 mg Oral QHS  . multivitamin with minerals  1 tablet Oral Q Mon  . omega-3 acid ethyl esters  1 capsule Oral Daily  . pantoprazole  40 mg Oral Daily  . predniSONE  10 mg Oral Q breakfast  . spironolactone  25 mg Oral BID  . torsemide  60 mg Oral BID  . venlafaxine XR  37.5 mg Oral Q breakfast       PRN Medications: acetaminophen **OR** acetaminophen, alum & mag hydroxide-simeth, gabapentin, guaiFENesin-dextromethorphan, hydrOXYzine, ipratropium-albuterol, menthol-cetylpyridinium, ondansetron **OR** ondansetron (ZOFRAN) IV, oxyCODONE, tiZANidine   Assessment and Plan:   1. Acute on Chronic Diastolic CHF:  Echo demonstrates normal LV fx with grade I diastolic dysfunction, RAE/RVE severely reduced RV function and severely elevated pulmonary pressure. She has diuresed 32.2 liters. No evidence of edema, abdominal distention. Continues JVD although somewhat difficult to see due to obesity and CPAP.   Currently on  torsemide 60 mg BID. Creatinine stable at 1.13. CO2 35 which has been stable. She remains on spironolactone as well. Uncertain dry wt. She has lost 16 lbs per wts.   2. Hypertension: BP is low normal this am, and has been trending down over admission. Consider decreasing torsemide dose as she appears euvolemic on assessment.   3. COPD with OSA: She has normal breathing. Appears comfortable. Defer to PTH and pulmonary for management.   4. AKI on CKD Stage 2: Creatinine is stable currently.   Phill Myron. Ferlin Fairhurst NP [place]  04/11/2015, 8:08 AM

## 2015-04-13 DIAGNOSIS — G8929 Other chronic pain: Secondary | ICD-10-CM | POA: Diagnosis not present

## 2015-04-13 DIAGNOSIS — I10 Essential (primary) hypertension: Secondary | ICD-10-CM | POA: Diagnosis not present

## 2015-04-13 DIAGNOSIS — G4733 Obstructive sleep apnea (adult) (pediatric): Secondary | ICD-10-CM | POA: Diagnosis not present

## 2015-04-13 DIAGNOSIS — M549 Dorsalgia, unspecified: Secondary | ICD-10-CM | POA: Diagnosis not present

## 2015-04-13 DIAGNOSIS — I5032 Chronic diastolic (congestive) heart failure: Secondary | ICD-10-CM | POA: Diagnosis not present

## 2015-04-13 DIAGNOSIS — F419 Anxiety disorder, unspecified: Secondary | ICD-10-CM | POA: Diagnosis not present

## 2015-04-13 DIAGNOSIS — J441 Chronic obstructive pulmonary disease with (acute) exacerbation: Secondary | ICD-10-CM | POA: Diagnosis not present

## 2015-04-13 DIAGNOSIS — J45909 Unspecified asthma, uncomplicated: Secondary | ICD-10-CM | POA: Diagnosis not present

## 2015-04-13 DIAGNOSIS — E119 Type 2 diabetes mellitus without complications: Secondary | ICD-10-CM | POA: Diagnosis not present

## 2015-04-13 DIAGNOSIS — J9611 Chronic respiratory failure with hypoxia: Secondary | ICD-10-CM | POA: Diagnosis not present

## 2015-04-13 DIAGNOSIS — D860 Sarcoidosis of lung: Secondary | ICD-10-CM | POA: Diagnosis not present

## 2015-04-13 DIAGNOSIS — E662 Morbid (severe) obesity with alveolar hypoventilation: Secondary | ICD-10-CM | POA: Diagnosis not present

## 2015-04-13 DIAGNOSIS — I272 Other secondary pulmonary hypertension: Secondary | ICD-10-CM | POA: Diagnosis not present

## 2015-04-14 ENCOUNTER — Ambulatory Visit: Payer: Self-pay | Admitting: Internal Medicine

## 2015-04-14 ENCOUNTER — Telehealth (HOSPITAL_COMMUNITY): Payer: Self-pay | Admitting: Surgery

## 2015-04-14 DIAGNOSIS — D860 Sarcoidosis of lung: Secondary | ICD-10-CM | POA: Diagnosis not present

## 2015-04-14 DIAGNOSIS — M549 Dorsalgia, unspecified: Secondary | ICD-10-CM | POA: Diagnosis not present

## 2015-04-14 DIAGNOSIS — G4733 Obstructive sleep apnea (adult) (pediatric): Secondary | ICD-10-CM | POA: Diagnosis not present

## 2015-04-14 DIAGNOSIS — E119 Type 2 diabetes mellitus without complications: Secondary | ICD-10-CM | POA: Diagnosis not present

## 2015-04-14 DIAGNOSIS — G8929 Other chronic pain: Secondary | ICD-10-CM | POA: Diagnosis not present

## 2015-04-14 DIAGNOSIS — F419 Anxiety disorder, unspecified: Secondary | ICD-10-CM | POA: Diagnosis not present

## 2015-04-14 DIAGNOSIS — J441 Chronic obstructive pulmonary disease with (acute) exacerbation: Secondary | ICD-10-CM | POA: Diagnosis not present

## 2015-04-14 DIAGNOSIS — J9611 Chronic respiratory failure with hypoxia: Secondary | ICD-10-CM | POA: Diagnosis not present

## 2015-04-14 DIAGNOSIS — I5032 Chronic diastolic (congestive) heart failure: Secondary | ICD-10-CM | POA: Diagnosis not present

## 2015-04-14 DIAGNOSIS — E662 Morbid (severe) obesity with alveolar hypoventilation: Secondary | ICD-10-CM | POA: Diagnosis not present

## 2015-04-14 DIAGNOSIS — I10 Essential (primary) hypertension: Secondary | ICD-10-CM | POA: Diagnosis not present

## 2015-04-14 DIAGNOSIS — I272 Other secondary pulmonary hypertension: Secondary | ICD-10-CM | POA: Diagnosis not present

## 2015-04-14 DIAGNOSIS — J45909 Unspecified asthma, uncomplicated: Secondary | ICD-10-CM | POA: Diagnosis not present

## 2015-04-14 NOTE — Telephone Encounter (Signed)
[name] called and is concerned that she cannot get her Macitentan that was prescribed at hospital discharge.  Message has been forwarded to [place] as she handles these medications and specialty pharmacies.  [name] was reassured that [name] would be back in touch with her regarding this medication.  She is aware and agreeable.

## 2015-04-15 ENCOUNTER — Other Ambulatory Visit: Payer: Self-pay

## 2015-04-15 DIAGNOSIS — J4521 Mild intermittent asthma with (acute) exacerbation: Secondary | ICD-10-CM

## 2015-04-16 ENCOUNTER — Ambulatory Visit (INDEPENDENT_AMBULATORY_CARE_PROVIDER_SITE_OTHER): Payer: Medicare Other | Admitting: Adult Health

## 2015-04-16 ENCOUNTER — Encounter: Payer: Self-pay | Admitting: Adult Health

## 2015-04-16 VITALS — BP 122/86 | HR 110 | Temp 98.3°F | Ht 68.0 in | Wt 291.0 lb

## 2015-04-16 DIAGNOSIS — J439 Emphysema, unspecified: Secondary | ICD-10-CM | POA: Diagnosis not present

## 2015-04-16 DIAGNOSIS — G4733 Obstructive sleep apnea (adult) (pediatric): Secondary | ICD-10-CM | POA: Diagnosis not present

## 2015-04-16 DIAGNOSIS — I272 Pulmonary hypertension, unspecified: Secondary | ICD-10-CM

## 2015-04-16 DIAGNOSIS — I5032 Chronic diastolic (congestive) heart failure: Secondary | ICD-10-CM

## 2015-04-16 DIAGNOSIS — I270 Primary pulmonary hypertension: Secondary | ICD-10-CM | POA: Diagnosis not present

## 2015-04-16 NOTE — Assessment & Plan Note (Signed)
  Continue on BIPAP At bedtime   Oxygen 3l/m rest and 4 l/m with walking   Please contact office for sooner follow up if symptoms do not improve or worsen or seek emergency care  Follow up Dr. Elsworth Soho in 4 weeks  and As needed

## 2015-04-16 NOTE — Assessment & Plan Note (Signed)
Awaiting Macitenan approval  Plan  Limit excessive fluid intake  Continue on BIPAP At bedtime   Oxygen 3l/m rest and 4 l/m with walking  Continue on low salt diet  Keep legs elevated.  Follow up with CHF clinic next week as planned  Please contact office for sooner follow up if symptoms do not improve or worsen or seek emergency care  Follow up Dr. Elsworth Soho in 4 weeks  and As needed

## 2015-04-16 NOTE — Assessment & Plan Note (Signed)
Appears compensated without fluid overload  Plan  Limit excessive fluid intake  Continue on BIPAP At bedtime   Oxygen 3l/m rest and 4 l/m with walking  Continue on low salt diet  Keep legs elevated.  Follow up with CHF clinic next week as planned  Please contact office for sooner follow up if symptoms do not improve or worsen or seek emergency care  Follow up Dr. Elsworth Soho in 4 weeks  and As needed

## 2015-04-16 NOTE — Progress Notes (Signed)
   Subjective:    [name], female    DOB: [birth year], 49 y.o.   MRN: [id number]  HPI  49 yo female smoker with known hx of COPD and obesity hypoventilation syndrome with chronic respiratory failure, as well as sleep apnea.  Known  diastolic dysfunction with a tendency toward fluid overload.  Polysubstance abuse with cocaine use.    04/16/2015 Follow up : COPD, OSA/OHS , Chronic Resp Failure on O2. , Severe PAH on echo  Patient returns for a one-week post hospital stay. Admitted with COPD exacerbation , tx w/ abx and steroids  Seen by cardiology for severe PAH, diastolic CHF , Demadex increased 60mg  Twice daily   And started on Macitenan 10mg  daily .  She did have Hyperkalemia on admission requiring kayexalate/calcium gluconate.  Has follow up with cards next week and PCP in 2 weeks .  ACE level in May was 100.  Swelling is less in legs, wt is down 8 lbs since last ov.  Wt at home 284 . Did not get Macitenan , pharmacy does not have medicine, CHF clinic is working on getting her meds approved.  Denies chest pain, orthopnea, or edema or fever .     Review of Systems  Constitutional:   No  weight loss, night sweats,  Fevers, chills, +fatigue, or  lassitude.  HEENT:   No headaches,  Difficulty swallowing,  Tooth/dental problems, or  Sore throat,                No sneezing, itching, ear ache,  +nasal congestion, post nasal drip,   CV:  No chest pain,  Orthopnea, PND,  , anasarca, dizziness, palpitations, syncope.   GI  No heartburn, indigestion, abdominal pain, nausea, vomiting, diarrhea, change in bowel habits, loss of appetite, bloody stools.   Resp:  .  No chest wall deformity  Skin: no rash or lesions.  GU: no dysuria, change in color of urine, no urgency or frequency.  No flank pain, no hematuria   MS:  No joint pain or swelling.  No decreased range of motion.  No back pain.  Psych:  No change in mood or affect. No depression or anxiety.  No memory  loss.         Objective:   Physical Exam  GEN: A/Ox3; pleasant , NAD, obese, on O2   HEENT:  [place]/AT,  EACs-clear, TMs-wnl, NOSE-clear drainage  THROAT-clear, no lesions, no postnasal drip or exudate noted.   NECK:  Supple w/ fair ROM; no JVD; normal carotid impulses w/o bruits; no thyromegaly or nodules palpated; no lymphadenopathy.  RESP  Decreased BS , no wheezing  .no accessory muscle use, no dullness to percussion  CARD:  RRR, no m/r/g  , no peripheral edema, pulses intact, no cyanosis or clubbing.  GI:   Soft & nt; nml bowel sounds; no organomegaly or masses detected.  Musco: Warm bil, no deformities or joint swelling noted.   Neuro: alert, no focal deficits noted.    Skin: Warm, no lesions or rashes         Assessment & Plan:

## 2015-04-16 NOTE — Assessment & Plan Note (Signed)
Compensated without flare   Plan  Cont on O2 and current regimen

## 2015-04-16 NOTE — Patient Instructions (Signed)
Limit excessive fluid intake  Continue on BIPAP At bedtime   Oxygen 3l/m rest and 4 l/m with walking  Continue on low salt diet  Keep legs elevated.  Follow up with CHF clinic next week as planned  Please contact office for sooner follow up if symptoms do not improve or worsen or seek emergency care  Follow up Dr. Elsworth Soho in 4 weeks  and As needed

## 2015-04-18 ENCOUNTER — Emergency Department (HOSPITAL_COMMUNITY): Payer: Medicare Other

## 2015-04-18 ENCOUNTER — Inpatient Hospital Stay (HOSPITAL_COMMUNITY)
Admission: EM | Admit: 2015-04-18 | Discharge: 2015-04-23 | DRG: 291 | Disposition: A | Discharge status: Home / Self Care | Payer: Medicare Other | Attending: Internal Medicine | Admitting: Internal Medicine

## 2015-04-18 ENCOUNTER — Encounter (HOSPITAL_COMMUNITY): Payer: Self-pay | Admitting: *Deleted

## 2015-04-18 DIAGNOSIS — Z6841 Body Mass Index (BMI) 40.0 and over, adult: Secondary | ICD-10-CM | POA: Diagnosis not present

## 2015-04-18 DIAGNOSIS — J439 Emphysema, unspecified: Secondary | ICD-10-CM | POA: Diagnosis present

## 2015-04-18 DIAGNOSIS — R0602 Shortness of breath: Secondary | ICD-10-CM

## 2015-04-18 DIAGNOSIS — J9600 Acute respiratory failure, unspecified whether with hypoxia or hypercapnia: Secondary | ICD-10-CM | POA: Diagnosis present

## 2015-04-18 DIAGNOSIS — F419 Anxiety disorder, unspecified: Secondary | ICD-10-CM | POA: Diagnosis not present

## 2015-04-18 DIAGNOSIS — N183 Chronic kidney disease, stage 3 (moderate): Secondary | ICD-10-CM | POA: Diagnosis not present

## 2015-04-18 DIAGNOSIS — Z881 Allergy status to other antibiotic agents status: Secondary | ICD-10-CM

## 2015-04-18 DIAGNOSIS — K219 Gastro-esophageal reflux disease without esophagitis: Secondary | ICD-10-CM | POA: Diagnosis not present

## 2015-04-18 DIAGNOSIS — R002 Palpitations: Secondary | ICD-10-CM | POA: Diagnosis not present

## 2015-04-18 DIAGNOSIS — M7989 Other specified soft tissue disorders: Secondary | ICD-10-CM | POA: Diagnosis not present

## 2015-04-18 DIAGNOSIS — Z91040 Latex allergy status: Secondary | ICD-10-CM

## 2015-04-18 DIAGNOSIS — Z9981 Dependence on supplemental oxygen: Secondary | ICD-10-CM

## 2015-04-18 DIAGNOSIS — J9601 Acute respiratory failure with hypoxia: Secondary | ICD-10-CM

## 2015-04-18 DIAGNOSIS — I129 Hypertensive chronic kidney disease with stage 1 through stage 4 chronic kidney disease, or unspecified chronic kidney disease: Secondary | ICD-10-CM | POA: Diagnosis not present

## 2015-04-18 DIAGNOSIS — J45909 Unspecified asthma, uncomplicated: Secondary | ICD-10-CM | POA: Diagnosis present

## 2015-04-18 DIAGNOSIS — F332 Major depressive disorder, recurrent severe without psychotic features: Secondary | ICD-10-CM | POA: Diagnosis not present

## 2015-04-18 DIAGNOSIS — I5032 Chronic diastolic (congestive) heart failure: Secondary | ICD-10-CM | POA: Diagnosis not present

## 2015-04-18 DIAGNOSIS — M109 Gout, unspecified: Secondary | ICD-10-CM | POA: Diagnosis not present

## 2015-04-18 DIAGNOSIS — R609 Edema, unspecified: Secondary | ICD-10-CM | POA: Diagnosis not present

## 2015-04-18 DIAGNOSIS — Z87891 Personal history of nicotine dependence: Secondary | ICD-10-CM

## 2015-04-18 DIAGNOSIS — Z7982 Long term (current) use of aspirin: Secondary | ICD-10-CM

## 2015-04-18 DIAGNOSIS — Y95 Nosocomial condition: Secondary | ICD-10-CM | POA: Diagnosis present

## 2015-04-18 DIAGNOSIS — J9621 Acute and chronic respiratory failure with hypoxia: Secondary | ICD-10-CM | POA: Diagnosis present

## 2015-04-18 DIAGNOSIS — I270 Primary pulmonary hypertension: Secondary | ICD-10-CM | POA: Diagnosis not present

## 2015-04-18 DIAGNOSIS — D869 Sarcoidosis, unspecified: Secondary | ICD-10-CM | POA: Diagnosis present

## 2015-04-18 DIAGNOSIS — R600 Localized edema: Secondary | ICD-10-CM | POA: Diagnosis not present

## 2015-04-18 DIAGNOSIS — E1122 Type 2 diabetes mellitus with diabetic chronic kidney disease: Secondary | ICD-10-CM | POA: Diagnosis not present

## 2015-04-18 DIAGNOSIS — B9620 Unspecified Escherichia coli [E. coli] as the cause of diseases classified elsewhere: Secondary | ICD-10-CM | POA: Diagnosis not present

## 2015-04-18 DIAGNOSIS — M797 Fibromyalgia: Secondary | ICD-10-CM | POA: Diagnosis present

## 2015-04-18 DIAGNOSIS — D860 Sarcoidosis of lung: Secondary | ICD-10-CM | POA: Diagnosis not present

## 2015-04-18 DIAGNOSIS — IMO0002 Reserved for concepts with insufficient information to code with codable children: Secondary | ICD-10-CM | POA: Diagnosis present

## 2015-04-18 DIAGNOSIS — I5033 Acute on chronic diastolic (congestive) heart failure: Secondary | ICD-10-CM | POA: Diagnosis not present

## 2015-04-18 DIAGNOSIS — T380X5A Adverse effect of glucocorticoids and synthetic analogues, initial encounter: Secondary | ICD-10-CM | POA: Diagnosis present

## 2015-04-18 DIAGNOSIS — I272 Other secondary pulmonary hypertension: Secondary | ICD-10-CM | POA: Diagnosis present

## 2015-04-18 DIAGNOSIS — J441 Chronic obstructive pulmonary disease with (acute) exacerbation: Secondary | ICD-10-CM | POA: Diagnosis not present

## 2015-04-18 DIAGNOSIS — Z888 Allergy status to other drugs, medicaments and biological substances status: Secondary | ICD-10-CM

## 2015-04-18 DIAGNOSIS — E785 Hyperlipidemia, unspecified: Secondary | ICD-10-CM | POA: Diagnosis present

## 2015-04-18 DIAGNOSIS — A419 Sepsis, unspecified organism: Secondary | ICD-10-CM | POA: Diagnosis present

## 2015-04-18 DIAGNOSIS — N390 Urinary tract infection, site not specified: Secondary | ICD-10-CM | POA: Diagnosis present

## 2015-04-18 DIAGNOSIS — Z86010 Personal history of colonic polyps: Secondary | ICD-10-CM

## 2015-04-18 DIAGNOSIS — G4733 Obstructive sleep apnea (adult) (pediatric): Secondary | ICD-10-CM | POA: Diagnosis present

## 2015-04-18 DIAGNOSIS — E1165 Type 2 diabetes mellitus with hyperglycemia: Secondary | ICD-10-CM | POA: Diagnosis not present

## 2015-04-18 DIAGNOSIS — M25572 Pain in left ankle and joints of left foot: Secondary | ICD-10-CM | POA: Diagnosis not present

## 2015-04-18 DIAGNOSIS — I10 Essential (primary) hypertension: Secondary | ICD-10-CM | POA: Diagnosis not present

## 2015-04-18 DIAGNOSIS — R918 Other nonspecific abnormal finding of lung field: Secondary | ICD-10-CM | POA: Diagnosis not present

## 2015-04-18 DIAGNOSIS — Z7951 Long term (current) use of inhaled steroids: Secondary | ICD-10-CM

## 2015-04-18 LAB — I-STAT TROPONIN, ED: Troponin i, poc: 0 ng/mL (ref 0.00–0.08)

## 2015-04-18 NOTE — ED Notes (Signed)
Patient presents via EMS.  Approx 2pm patient became SOB and used her nebulizer.  Then about 6pm she had sudden palpitation and more SOB (patient is on 4l via [place] at home)  Upon EMS arrival sats on 4L were 90% and her heart rate was in the 140s.  Patient states she has not had much of an appetite and has not been drinking liike she shoud but has continued to take her "water pills".  Also c/o pain in her left great toe and ankle.  No redness noted.  EMS reported CBG 150, BP 142/84, P108, sats 95& 6[place].

## 2015-04-18 NOTE — ED Notes (Signed)
Oxygen increased to 5l due to request

## 2015-04-18 NOTE — ED Provider Notes (Signed)
CSN: [id number]     Arrival date & time 04/18/15  2030 History   First MD Initiated Contact with Patient 04/18/15 2118     Chief Complaint  Patient presents with  . Shortness of Breath   (Consider location/radiation/quality/duration/timing/severity/associated sxs/prior Treatment) HPI  Patient is a 49 year old female with a history of pulmonary hypertension, anxiety, hypertension, CHF, morbid obesity, pulmonary sarcoidosis, COPD on 4 L nasal cannula at home presented today for increasing shortness of breath. Patient reports she has been using her nebulizer at home with no alleviation. She awoke this morning with the shortness of breath but has continued to worsen throughout the day. She reports mild fevers and intermittent palpitations throughout the day as well. She denies any frank chest pain however. She is also had left ankle pain that began this morning. She reports that his increase of the point were clamped to walk on it. She describes as achy pain 6 out of 10, nonradiating with no alleviating factors. Aggravated by movement. She denies any trauma to the ankle or similar previous events.   Past Medical History  Diagnosis Date  . Hypertension   . Hyperlipidemia   . Chronic headache   . Fibromyalgia     daily narcotics  . Anxiety     hx chronic BZ use, stopped 07/2010  . Anemia   . Pulmonary sarcoidosis     unimpressive CT chest 2011  . Colonic polyp   . GERD (gastroesophageal reflux disease)   . ALLERGIC RHINITIS   . Asthma   . CHF (congestive heart failure)     Diastolic with fluid overload, May, 2012, LVEF 60%  . Morbid obesity   . Depression   . Panic attacks   . Diabetes mellitus   . COPD (chronic obstructive pulmonary disease)     on home O2, moderate airflow obstruction, suspect d/t emphysema  . Obesity   . Elevated LFTs 09/2011  . Ovarian cyst   . Chronic back pain   . Sleep apnea      CPAP  . Fatty liver   . Esophagitis   . Gastritis   . Internal hemorrhoids    . Adenomatous colon polyp 12/03/09    [facility] in [place], [place]   Past Surgical History  Procedure Laterality Date  . Polypectomy  2011  . Lumbar microdiscectomy  07/06/2011    R L4-5, stern  . Back surgery    . Carpal tunnel release    . Steroid spinal injections    . Hysteroscopy w/d&c N/A 03/25/2013    Procedure: DILATATION AND CURETTAGE /HYSTEROSCOPY;  Surgeon: [name], MD;  Location: [facility];  Service: Gynecology;  Laterality: N/A;  . Dilation and curettage of uterus    . Cardiac catheterization N/A 03/17/2015    Procedure: Right Heart Cath;  Surgeon: Larey Dresser, MD;  Location: [facility];  Service: Cardiovascular;  Laterality: N/A;   Family History  Problem Relation Age of Onset  . Hypertension Mother   . Heart disease Mother   . Emphysema Father   . Hypertension Father   . Stomach cancer Father   . Heart disease Father   . Allergies Brother   . Stomach cancer Brother   . Congestive Heart Failure Brother   . Diabetes type II Brother   . Heart disease Sister   . Diabetes type II Sister   . Heart attack Sister   . Stroke Sister   . Hypertension Sister   . Heart failure Sister  diastolic  . Hypertension Sister    History  Substance Use Topics  . Smoking status: Former Smoker -- 0.25 packs/day for 29 years    Types: Cigarettes    Quit date: 11/24/2014  . Smokeless tobacco: Never Used  . Alcohol Use: 0.0 oz/week    0 Standard drinks or equivalent per week     Comment: occasionally   OB History    No data available     Review of Systems  Constitutional: Positive for fever and fatigue. Negative for chills.  HENT: Negative for congestion and sore throat.   Eyes: Negative for pain.  Respiratory: Positive for cough, chest tightness and shortness of breath.   Cardiovascular: Negative for chest pain, palpitations and leg swelling.  Gastrointestinal: Negative for nausea, vomiting, abdominal pain and diarrhea.  Genitourinary: Negative for  dysuria and flank pain.  Musculoskeletal: Positive for arthralgias (left ankle and great toe pain). Negative for back pain and neck pain.  Skin: Negative for rash.  Allergic/Immunologic: Negative.   Neurological: Negative for dizziness and light-headedness.  Psychiatric/Behavioral: Negative for confusion.    Allergies  Simvastatin; Sulfamethoxazole-trimethoprim; Zolpidem tartrate; Azithromycin; Ceftin; Doxycycline; Latex; Metformin and related; Metolazone; Metronidazole; Morphine-naltrexone; Ciprofloxacin; and Tramadol  Home Medications   Prior to Admission medications   Medication Sig Start Date End Date Taking? Authorizing Provider  albuterol (PROVENTIL) (2.5 MG/3ML) 0.083% nebulizer solution INHALE CONTENTS OF 1 VIAL VIA NEBULIZER EVERY 6 HOURS AS NEEDED FOR WHEEZING OR SHORTNESS OF BREATH 03/30/15  Yes Rowe Clack, MD  ALPRAZolam Duanne Moron) 1 MG tablet Take 1 tablet (1 mg total) by mouth 3 (three) times daily. 01/25/15  Yes Rowe Clack, MD  aspirin EC 81 MG tablet Take 81 mg by mouth daily.   Yes Historical Provider, MD  BIOTIN PO Take 1 capsule by mouth daily.   Yes Historical Provider, MD  budesonide-formoterol (SYMBICORT) 160-4.5 MCG/ACT inhaler Inhale 2 puffs into the lungs 2 (two) times daily. 04/01/15  Yes Tammy S Parrett, NP  cetirizine (ZYRTEC) 1 MG/ML syrup Take 10 mLs (10 mg total) by mouth at bedtime. 01/06/15  Yes Rowe Clack, MD  diclofenac sodium (VOLTAREN) 1 % GEL Apply 4 g topically 4 (four) times daily. 04/01/15  Yes Rowe Clack, MD  esomeprazole (NEXIUM) 40 MG capsule Take 40 mg by mouth 2 (two) times daily before a meal.   Yes Historical Provider, MD  fluticasone (FLONASE) 50 MCG/ACT nasal spray Place 2 sprays into both nostrils daily. Patient taking differently: Place 2 sprays into both nostrils daily as needed for allergies or rhinitis.  10/19/14  Yes Rowe Clack, MD  gabapentin (NEURONTIN) 300 MG capsule Take 300 mg by mouth daily as needed  (neuropathy pain).    Yes Historical Provider, MD  glimepiride (AMARYL) 1 MG tablet Take 1 mg by mouth daily with breakfast.   Yes Historical Provider, MD  hydrOXYzine (ATARAX/VISTARIL) 10 MG tablet TAKE 1 TABLET BY MOUTH THREE TIMES DAILY AS NEEDED FOR ITCHING 08/27/14  Yes Biagio Borg, MD  insulin aspart (NOVOLOG FLEXPEN) 100 UNIT/ML FlexPen Inject 5 Units into the skin 3 (three) times daily with meals. Patient taking differently: Inject 6-10 Units into the skin 3 (three) times daily with meals.  12/28/14  Yes Shanker Kristeen Mans, MD  Insulin Glargine (LANTUS SOLOSTAR) 100 UNIT/ML Solostar Pen Inject 55 Units into the skin 2 (two) times daily. Patient taking differently: Inject 40 Units into the skin 2 (two) times daily.  04/11/15  Yes Barton Dubois, MD  Multiple Vitamin (MULTIVITAMIN) capsule Take 1 capsule by mouth once a week. Mondays   Yes Historical Provider, MD  omega-3 fish oil (MAXEPA) 1000 MG CAPS capsule Take 1 capsule by mouth daily.   Yes Historical Provider, MD  potassium chloride SA (K-DUR,KLOR-CON) 20 MEQ tablet Take 2 tablets (40 mEq total) by mouth 2 (two) times daily. 03/21/15  Yes Verlee Monte, MD  PROAIR HFA 108 (90 BASE) MCG/ACT inhaler INHALE 2 PUFFS BY MOUTH EVERY 6 HOURS AS NEEDED FOR WHEEZING OR SHORTNESS OF BREATH 03/30/15  Yes Rowe Clack, MD  spironolactone (ALDACTONE) 25 MG tablet Take 1 tablet (25 mg total) by mouth daily. 04/13/15  Yes Barton Dubois, MD  tiotropium (SPIRIVA HANDIHALER) 18 MCG inhalation capsule Place 1 capsule (18 mcg total) into inhaler and inhale daily. 04/01/15  Yes Tammy S Parrett, NP  tiZANidine (ZANAFLEX) 2 MG tablet Take 2 mg by mouth 2 (two) times daily as needed for muscle spasms.  03/10/15  Yes Historical Provider, MD  torsemide (DEMADEX) 20 MG tablet Take 3 tablets (60 mg total) by mouth 2 (two) times daily. 04/11/15  Yes Barton Dubois, MD  venlafaxine XR (EFFEXOR XR) 37.5 MG 24 hr capsule Take 1 capsule (37.5 mg total) by mouth daily with  breakfast. 11/13/14  Yes Rowe Clack, MD  Macitentan 10 MG TABS Take 10 mg by mouth daily. Patient not taking: Reported on 04/16/2015 04/11/15   Barton Dubois, MD   BP 129/78 mmHg  Pulse 73  Temp(Src) 100.1 F (37.8 C) (Oral)  Resp 18  Ht 5' 8"  (1.727 m)  Wt 284 lb (128.822 kg)  BMI 43.19 kg/m2  SpO2 98%  LMP 10/16/2012 Physical Exam  Constitutional: She is oriented to person, place, and time. She appears well-developed and well-nourished. No distress.  HENT:  Head: Normocephalic and atraumatic.  Eyes: Conjunctivae and EOM are normal. Pupils are equal, round, and reactive to light.  Neck: Normal range of motion. Neck supple.  Cardiovascular: Normal rate, regular rhythm and normal heart sounds.   Pulmonary/Chest: Accessory muscle usage present. Tachypnea noted. She is in respiratory distress. She has decreased breath sounds (diffuse). She has no wheezes. She has no rhonchi.  Abdominal: Soft. Bowel sounds are normal. There is no tenderness.  Musculoskeletal:       Left ankle: She exhibits decreased range of motion and swelling. She exhibits no ecchymosis, no deformity, no laceration and normal pulse. Tenderness. Lateral malleolus, medial malleolus and AITFL tenderness found. Achilles tendon normal.  Neurological: She is alert and oriented to person, place, and time. She has normal reflexes. No cranial nerve deficit.  Skin: Skin is warm and dry. She is not diaphoretic.  Psychiatric: She has a normal mood and affect.    ED Course  Procedures (including critical care time) Labs Review Labs Reviewed  CBC WITH DIFFERENTIAL/PLATELET - Abnormal; Notable for the following:    RBC 5.19 (*)    MCV 77.5 (*)    MCH 25.4 (*)    RDW 17.1 (*)    All other components within normal limits  COMPREHENSIVE METABOLIC PANEL - Abnormal; Notable for the following:    Sodium 127 (*)    Potassium 3.4 (*)    Chloride 88 (*)    Glucose, Bld 228 (*)    Creatinine, Ser 1.17 (*)    Calcium 8.4 (*)     Albumin 3.3 (*)    ALT 60 (*)    GFR calc non Af Amer 54 (*)    All other  components within normal limits  SEDIMENTATION RATE - Abnormal; Notable for the following:    Sed Rate 37 (*)    All other components within normal limits  C-REACTIVE PROTEIN - Abnormal; Notable for the following:    CRP 14.4 (*)    All other components within normal limits  URINALYSIS, ROUTINE W REFLEX MICROSCOPIC (NOT AT [facility]) - Abnormal; Notable for the following:    APPearance CLOUDY (*)    Hgb urine dipstick SMALL (*)    Nitrite POSITIVE (*)    Leukocytes, UA TRACE (*)    All other components within normal limits  MAGNESIUM - Abnormal; Notable for the following:    Magnesium 1.5 (*)    All other components within normal limits  URINE MICROSCOPIC-ADD ON - Abnormal; Notable for the following:    Bacteria, UA MANY (*)    All other components within normal limits  COMPREHENSIVE METABOLIC PANEL - Abnormal; Notable for the following:    Sodium 129 (*)    Chloride 94 (*)    Glucose, Bld 404 (*)    Creatinine, Ser 1.08 (*)    Calcium 8.3 (*)    Albumin 3.3 (*)    Total Bilirubin 1.6 (*)    GFR calc non Af Amer 60 (*)    All other components within normal limits  CBC WITH DIFFERENTIAL/PLATELET - Abnormal; Notable for the following:    MCV 77.5 (*)    MCH 25.6 (*)    RDW 17.2 (*)    Neutrophils Relative % 91 (*)    Lymphocytes Relative 7 (*)    Lymphs Abs 0.5 (*)    Monocytes Relative 2 (*)    All other components within normal limits  URIC ACID - Abnormal; Notable for the following:    Uric Acid, Serum 8.8 (*)    All other components within normal limits  I-STAT CG4 LACTIC ACID, ED - Abnormal; Notable for the following:    Lactic Acid, Venous 2.17 (*)    All other components within normal limits  CBG MONITORING, ED - Abnormal; Notable for the following:    Glucose-Capillary 475 (*)    All other components within normal limits  CBG MONITORING, ED - Abnormal; Notable for the following:     Glucose-Capillary 469 (*)    All other components within normal limits  CBG MONITORING, ED - Abnormal; Notable for the following:    Glucose-Capillary 405 (*)    All other components within normal limits  CULTURE, BLOOD (ROUTINE X 2)  CULTURE, BLOOD (ROUTINE X 2)  URINE CULTURE  BRAIN NATRIURETIC PEPTIDE  LACTIC ACID, PLASMA  PROCALCITONIN  PROTIME-INR  APTT  TSH  I-STAT TROPOININ, ED  I-STAT CG4 LACTIC ACID, ED    Imaging Review Dg Chest 2 View  04/18/2015   CLINICAL DATA:  Shortness of breath.  History of sarcoidosis  EXAM: CHEST  2 VIEW  COMPARISON:  April 12, 2015 and March 15, 2015 chest radiograph; chest CT March 16, 2015  FINDINGS: There is stable scarring/ fibrotic type change in the lung bases, more severe on the right than on the left. There is also mild scarring in the medial left upper lobe. There is no frank edema or consolidation. The heart is upper normal in size with pulmonary vascularity within normal limits. No adenopathy apparent. No bone lesions.  IMPRESSION: Areas of scarring, most pronounced in the right lower lobe, stable. No new opacity. No change in cardiac silhouette.   Electronically Signed   By: Gwyndolyn Saxon  Jasmine December III M.D.   On: 04/18/2015 21:53   Dg Ankle 2 Views Left  04/19/2015   CLINICAL DATA:  Left ankle pain and swelling.  No known trauma.  EXAM: LEFT ANKLE - 2 VIEW  COMPARISON:  None.  FINDINGS: No fracture or dislocation. The alignment and joint spaces are maintained. The ankle mortise is preserved. No erosion or periosteal reaction. Mild soft tissue edema is noted. No radiopaque foreign body.  IMPRESSION: Soft tissue edema without associated osseous abnormality.   Electronically Signed   By: Jeb Levering M.D.   On: 04/19/2015 01:20   Ct Angio Chest Pe W/cm &/or Wo Cm  04/19/2015   CLINICAL DATA:  Dyspnea and palpitations.  EXAM: CT ANGIOGRAPHY CHEST WITH CONTRAST  TECHNIQUE: Multidetector CT imaging of the chest was performed using the standard protocol  during bolus administration of intravenous contrast. Multiplanar CT image reconstructions and MIPs were obtained to evaluate the vascular anatomy.  CONTRAST:  121m OMNIPAQUE IOHEXOL 350 MG/ML SOLN  COMPARISON:  03/16/2015  FINDINGS: Cardiovascular: There is good opacification of the pulmonary arteries. There is no pulmonary embolism. The thoracic aorta is normal in caliber and intact.  Lungs: There is right upper lobe confluent airspace opacity, new. This is superimposed on the chronic centrilobular and paraseptal emphysematous changes as well as the reticulonodular peribronchovascular opacities which are likely associated with the known sarcoidosis. This could represent infectious infiltrate or aspiration.  Central airways: Patent  Effusions: None  Lymphadenopathy: Mild symmetric hilar adenopathy.  Esophagus: Unremarkable  Upper abdomen: No significant abnormality  Musculoskeletal: No significant abnormality  Review of the MIP images confirms the above findings.  IMPRESSION: 1. Negative for pulmonary embolism 2. New right upper lobe airspace opacity which could represent infectious infiltrate in car aspiration superimposed on the chronic changes of sarcoidosis and emphysema.   Electronically Signed   By: DAndreas NewportM.D.   On: 04/19/2015 02:46     EKG Interpretation   Date/Time:  Sunday April 18 2015 20:34:09 EDT Ventricular Rate:  105 PR Interval:  146 QRS Duration: 83 QT Interval:  344 QTC Calculation: 455 R Axis:   -6 Text Interpretation:  Age not entered, assumed to be  49years old for  purpose of ECG interpretation Sinus tachycardia Ventricular premature  complex Aberrant conduction of SV complex(es) ED PHYSICIAN INTERPRETATION  AVAILABLE IN [facility] Confirmed by TEST, Record ([id number]) on  04/19/2015 6:55:30 AM      MDM   Final diagnoses:  Edema    Patient is a 49year old female with a history of pulmonary hypertension, anxiety, hypertension, CHF, morbid obesity,  pulmonary sarcoidosis, COPD on 4 L nasal cannula at home presented today for increasing shortness of breath.  DDX SOB: ACS, arrhythmia, COPD exacerbation, PE DDX ankle pain: Musculoskeletal strain, gout, septic joint.  On initial evaluation patient was tachycardic and mildly hypoxic. She was mildly tachypnea can febrile and on lung auscultation poor movement of air. Ordered steroids and breathing treatment. EKG showed no acute ischemic changes. Troponin negative. Patient had a recent PE study 1 month prior. Patient does complain of shortness of breath with tachycardia and needing slightly more oxygen than home use. Discussed this with hospitalist who would address on admission. Do not feel inclined to scan her in the emergency department due to recent negative scans and no recent surgeries or changes. Chest x-ray performed showing no acute infiltrate. Patient also displayed no leukocytosis. Did not start on antibiotics in the emergency department. Blood cultures sent.  Sodium found  to be 127. Started on normal saline infusion at slow rate.  Ankle evaluated with diffuse tenderness. Only mild warmth with no overlying erythema. Can not rule out septic joint at this point.  Ankle xr, ESR, and CRP pending.   Consulted Dr. Hal Hope with hospitalist service for admission and further evaluation.    If performed, labs, EKGs, and imaging were reviewed/interpreted by myself and my attending and incorporated into medical decision making.  Discussed pertinent finding with patient or caregiver prior to admission with no further questions.  Pt care supervised by my attending Dr. Johnney Killian.   Geronimo Boot, MD PGY-2  Emergency Medicine   Geronimo Boot, MD 04/19/15 1252  Charlesetta Shanks, MD 04/25/15 Lurline Hare

## 2015-04-19 ENCOUNTER — Emergency Department (HOSPITAL_COMMUNITY): Payer: Medicare Other

## 2015-04-19 ENCOUNTER — Encounter (HOSPITAL_COMMUNITY): Payer: Self-pay

## 2015-04-19 ENCOUNTER — Encounter (HOSPITAL_COMMUNITY): Payer: Self-pay | Admitting: Internal Medicine

## 2015-04-19 ENCOUNTER — Telehealth (HOSPITAL_COMMUNITY): Payer: Self-pay

## 2015-04-19 ENCOUNTER — Inpatient Hospital Stay (HOSPITAL_BASED_OUTPATIENT_CLINIC_OR_DEPARTMENT_OTHER): Payer: Medicare Other

## 2015-04-19 DIAGNOSIS — R600 Localized edema: Secondary | ICD-10-CM | POA: Diagnosis not present

## 2015-04-19 DIAGNOSIS — Y95 Nosocomial condition: Secondary | ICD-10-CM | POA: Diagnosis present

## 2015-04-19 DIAGNOSIS — D869 Sarcoidosis, unspecified: Secondary | ICD-10-CM

## 2015-04-19 DIAGNOSIS — J441 Chronic obstructive pulmonary disease with (acute) exacerbation: Secondary | ICD-10-CM | POA: Diagnosis present

## 2015-04-19 DIAGNOSIS — R0602 Shortness of breath: Secondary | ICD-10-CM

## 2015-04-19 DIAGNOSIS — G4733 Obstructive sleep apnea (adult) (pediatric): Secondary | ICD-10-CM

## 2015-04-19 DIAGNOSIS — M25572 Pain in left ankle and joints of left foot: Secondary | ICD-10-CM | POA: Diagnosis not present

## 2015-04-19 DIAGNOSIS — Z9981 Dependence on supplemental oxygen: Secondary | ICD-10-CM | POA: Diagnosis not present

## 2015-04-19 DIAGNOSIS — J45909 Unspecified asthma, uncomplicated: Secondary | ICD-10-CM | POA: Diagnosis present

## 2015-04-19 DIAGNOSIS — M109 Gout, unspecified: Secondary | ICD-10-CM | POA: Diagnosis present

## 2015-04-19 DIAGNOSIS — M797 Fibromyalgia: Secondary | ICD-10-CM | POA: Diagnosis present

## 2015-04-19 DIAGNOSIS — J439 Emphysema, unspecified: Secondary | ICD-10-CM | POA: Diagnosis not present

## 2015-04-19 DIAGNOSIS — D860 Sarcoidosis of lung: Secondary | ICD-10-CM | POA: Diagnosis not present

## 2015-04-19 DIAGNOSIS — Z881 Allergy status to other antibiotic agents status: Secondary | ICD-10-CM | POA: Diagnosis not present

## 2015-04-19 DIAGNOSIS — I272 Other secondary pulmonary hypertension: Secondary | ICD-10-CM | POA: Diagnosis present

## 2015-04-19 DIAGNOSIS — R609 Edema, unspecified: Secondary | ICD-10-CM | POA: Diagnosis not present

## 2015-04-19 DIAGNOSIS — K219 Gastro-esophageal reflux disease without esophagitis: Secondary | ICD-10-CM | POA: Diagnosis present

## 2015-04-19 DIAGNOSIS — I5033 Acute on chronic diastolic (congestive) heart failure: Secondary | ICD-10-CM | POA: Diagnosis not present

## 2015-04-19 DIAGNOSIS — I270 Primary pulmonary hypertension: Secondary | ICD-10-CM

## 2015-04-19 DIAGNOSIS — I5032 Chronic diastolic (congestive) heart failure: Secondary | ICD-10-CM

## 2015-04-19 DIAGNOSIS — J9621 Acute and chronic respiratory failure with hypoxia: Secondary | ICD-10-CM

## 2015-04-19 DIAGNOSIS — Z7982 Long term (current) use of aspirin: Secondary | ICD-10-CM | POA: Diagnosis not present

## 2015-04-19 DIAGNOSIS — A419 Sepsis, unspecified organism: Secondary | ICD-10-CM | POA: Diagnosis present

## 2015-04-19 DIAGNOSIS — T380X5A Adverse effect of glucocorticoids and synthetic analogues, initial encounter: Secondary | ICD-10-CM | POA: Diagnosis present

## 2015-04-19 DIAGNOSIS — J9819 Other pulmonary collapse: Secondary | ICD-10-CM | POA: Diagnosis not present

## 2015-04-19 DIAGNOSIS — J9601 Acute respiratory failure with hypoxia: Secondary | ICD-10-CM | POA: Diagnosis not present

## 2015-04-19 DIAGNOSIS — R918 Other nonspecific abnormal finding of lung field: Secondary | ICD-10-CM | POA: Diagnosis not present

## 2015-04-19 DIAGNOSIS — Z888 Allergy status to other drugs, medicaments and biological substances status: Secondary | ICD-10-CM | POA: Diagnosis not present

## 2015-04-19 DIAGNOSIS — Z6841 Body Mass Index (BMI) 40.0 and over, adult: Secondary | ICD-10-CM | POA: Diagnosis not present

## 2015-04-19 DIAGNOSIS — Z91040 Latex allergy status: Secondary | ICD-10-CM | POA: Diagnosis not present

## 2015-04-19 DIAGNOSIS — N390 Urinary tract infection, site not specified: Secondary | ICD-10-CM | POA: Diagnosis present

## 2015-04-19 DIAGNOSIS — J984 Other disorders of lung: Secondary | ICD-10-CM | POA: Diagnosis not present

## 2015-04-19 DIAGNOSIS — B9620 Unspecified Escherichia coli [E. coli] as the cause of diseases classified elsewhere: Secondary | ICD-10-CM | POA: Diagnosis present

## 2015-04-19 DIAGNOSIS — M7989 Other specified soft tissue disorders: Secondary | ICD-10-CM | POA: Diagnosis present

## 2015-04-19 DIAGNOSIS — F332 Major depressive disorder, recurrent severe without psychotic features: Secondary | ICD-10-CM | POA: Diagnosis present

## 2015-04-19 DIAGNOSIS — Z87891 Personal history of nicotine dependence: Secondary | ICD-10-CM | POA: Diagnosis not present

## 2015-04-19 DIAGNOSIS — I517 Cardiomegaly: Secondary | ICD-10-CM | POA: Diagnosis not present

## 2015-04-19 DIAGNOSIS — Z86010 Personal history of colonic polyps: Secondary | ICD-10-CM | POA: Diagnosis not present

## 2015-04-19 DIAGNOSIS — E1165 Type 2 diabetes mellitus with hyperglycemia: Secondary | ICD-10-CM | POA: Diagnosis not present

## 2015-04-19 DIAGNOSIS — I129 Hypertensive chronic kidney disease with stage 1 through stage 4 chronic kidney disease, or unspecified chronic kidney disease: Secondary | ICD-10-CM | POA: Diagnosis present

## 2015-04-19 DIAGNOSIS — N183 Chronic kidney disease, stage 3 (moderate): Secondary | ICD-10-CM | POA: Diagnosis present

## 2015-04-19 DIAGNOSIS — E785 Hyperlipidemia, unspecified: Secondary | ICD-10-CM | POA: Diagnosis present

## 2015-04-19 DIAGNOSIS — R002 Palpitations: Secondary | ICD-10-CM | POA: Diagnosis not present

## 2015-04-19 DIAGNOSIS — J9600 Acute respiratory failure, unspecified whether with hypoxia or hypercapnia: Secondary | ICD-10-CM | POA: Diagnosis present

## 2015-04-19 DIAGNOSIS — F419 Anxiety disorder, unspecified: Secondary | ICD-10-CM | POA: Diagnosis present

## 2015-04-19 DIAGNOSIS — Z7951 Long term (current) use of inhaled steroids: Secondary | ICD-10-CM | POA: Diagnosis not present

## 2015-04-19 DIAGNOSIS — E1122 Type 2 diabetes mellitus with diabetic chronic kidney disease: Secondary | ICD-10-CM | POA: Diagnosis present

## 2015-04-19 LAB — CBC WITH DIFFERENTIAL/PLATELET
Basophils Absolute: 0 10*3/uL (ref 0.0–0.1)
Basophils Absolute: 0 10*3/uL (ref 0.0–0.1)
Basophils Relative: 0 % (ref 0–1)
Basophils Relative: 0 % (ref 0–1)
EOS ABS: 0.1 10*3/uL (ref 0.0–0.7)
EOS PCT: 2 % (ref 0–5)
Eosinophils Absolute: 0 10*3/uL (ref 0.0–0.7)
Eosinophils Relative: 0 % (ref 0–5)
HCT: 40.2 % (ref 36.0–46.0)
HEMATOCRIT: 39 % (ref 36.0–46.0)
Hemoglobin: 13.2 g/dL (ref 12.0–15.0)
Hemoglobin: 12.9 g/dL (ref 12.0–15.0)
LYMPHS ABS: 0.5 10*3/uL — AB (ref 0.7–4.0)
LYMPHS PCT: 7 % — AB (ref 12–46)
Lymphocytes Relative: 13 % (ref 12–46)
Lymphs Abs: 1.1 10*3/uL (ref 0.7–4.0)
MCH: 25.4 pg — ABNORMAL LOW (ref 26.0–34.0)
MCH: 25.6 pg — ABNORMAL LOW (ref 26.0–34.0)
MCHC: 32.8 g/dL (ref 30.0–36.0)
MCHC: 33.1 g/dL (ref 30.0–36.0)
MCV: 77.5 fL — AB (ref 78.0–100.0)
MCV: 77.5 fL — ABNORMAL LOW (ref 78.0–100.0)
MONO ABS: 0.1 10*3/uL (ref 0.1–1.0)
MONOS PCT: 9 % (ref 3–12)
Monocytes Absolute: 0.8 10*3/uL (ref 0.1–1.0)
Monocytes Relative: 2 % — ABNORMAL LOW (ref 3–12)
NEUTROS PCT: 76 % (ref 43–77)
Neutro Abs: 6.5 10*3/uL (ref 1.7–7.7)
Neutro Abs: 7.4 10*3/uL (ref 1.7–7.7)
Neutrophils Relative %: 91 % — ABNORMAL HIGH (ref 43–77)
Platelets: 179 10*3/uL (ref 150–400)
Platelets: 151 10*3/uL (ref 150–400)
RBC: 5.19 MIL/uL — AB (ref 3.87–5.11)
RBC: 5.03 MIL/uL (ref 3.87–5.11)
RDW: 17.1 % — ABNORMAL HIGH (ref 11.5–15.5)
RDW: 17.2 % — ABNORMAL HIGH (ref 11.5–15.5)
WBC: 8.6 10*3/uL (ref 4.0–10.5)
WBC: 8.1 10*3/uL (ref 4.0–10.5)

## 2015-04-19 LAB — URIC ACID: Uric Acid, Serum: 8.8 mg/dL — ABNORMAL HIGH (ref 2.3–6.6)

## 2015-04-19 LAB — COMPREHENSIVE METABOLIC PANEL
ALBUMIN: 3.3 g/dL — AB (ref 3.5–5.0)
ALT: 60 U/L — AB (ref 14–54)
ALT: 54 U/L (ref 14–54)
ANION GAP: 11 (ref 5–15)
AST: 34 U/L (ref 15–41)
AST: 28 U/L (ref 15–41)
Albumin: 3.3 g/dL — ABNORMAL LOW (ref 3.5–5.0)
Alkaline Phosphatase: 96 U/L (ref 38–126)
Alkaline Phosphatase: 93 U/L (ref 38–126)
Anion gap: 12 (ref 5–15)
BUN: 9 mg/dL (ref 6–20)
BUN: 12 mg/dL (ref 6–20)
CO2: 27 mmol/L (ref 22–32)
CO2: 24 mmol/L (ref 22–32)
CREATININE: 1.17 mg/dL — AB (ref 0.44–1.00)
CREATININE: 1.08 mg/dL — AB (ref 0.44–1.00)
Calcium: 8.4 mg/dL — ABNORMAL LOW (ref 8.9–10.3)
Calcium: 8.3 mg/dL — ABNORMAL LOW (ref 8.9–10.3)
Chloride: 88 mmol/L — ABNORMAL LOW (ref 101–111)
Chloride: 94 mmol/L — ABNORMAL LOW (ref 101–111)
GFR calc Af Amer: >60 mL/min (ref >60)
GFR calc Af Amer: >60 mL/min (ref >60)
GFR, EST NON AFRICAN AMERICAN: 54 mL/min — AB (ref >60)
GFR, EST NON AFRICAN AMERICAN: 60 mL/min — AB (ref >60)
GLUCOSE: 228 mg/dL — AB (ref 65–99)
GLUCOSE: 404 mg/dL — AB (ref 65–99)
POTASSIUM: 3.4 mmol/L — AB (ref 3.5–5.1)
POTASSIUM: 4.3 mmol/L (ref 3.5–5.1)
Sodium: 127 mmol/L — ABNORMAL LOW (ref 135–145)
Sodium: 129 mmol/L — ABNORMAL LOW (ref 135–145)
TOTAL PROTEIN: 7.2 g/dL (ref 6.5–8.1)
Total Bilirubin: 1.2 mg/dL (ref 0.3–1.2)
Total Bilirubin: 1.6 mg/dL — ABNORMAL HIGH (ref 0.3–1.2)
Total Protein: 7.3 g/dL (ref 6.5–8.1)

## 2015-04-19 LAB — CBG MONITORING, ED
GLUCOSE-CAPILLARY: 469 mg/dL — AB (ref 65–99)
Glucose-Capillary: 475 mg/dL — ABNORMAL HIGH (ref 65–99)
Glucose-Capillary: 405 mg/dL — ABNORMAL HIGH (ref 65–99)

## 2015-04-19 LAB — MAGNESIUM: Magnesium: 1.5 mg/dL — ABNORMAL LOW (ref 1.7–2.4)

## 2015-04-19 LAB — TSH: TSH: 0.429 u[IU]/mL (ref 0.350–4.500)

## 2015-04-19 LAB — URINE MICROSCOPIC-ADD ON

## 2015-04-19 LAB — GLUCOSE, CAPILLARY
GLUCOSE-CAPILLARY: 312 mg/dL — AB (ref 65–99)
Glucose-Capillary: 413 mg/dL — ABNORMAL HIGH (ref 65–99)

## 2015-04-19 LAB — URINALYSIS, ROUTINE W REFLEX MICROSCOPIC
Bilirubin Urine: NEGATIVE
Glucose, UA: NEGATIVE mg/dL
Ketones, ur: NEGATIVE mg/dL
Nitrite: POSITIVE — AB
PROTEIN: NEGATIVE mg/dL
Specific Gravity, Urine: 1.009 (ref 1.005–1.030)
Urobilinogen, UA: 1 mg/dL (ref 0.0–1.0)
pH: 6.5 (ref 5.0–8.0)

## 2015-04-19 LAB — PROCALCITONIN

## 2015-04-19 LAB — SEDIMENTATION RATE: SED RATE: 37 mm/h — AB (ref 0–22)

## 2015-04-19 LAB — APTT: APTT: 29 s (ref 24–37)

## 2015-04-19 LAB — PROTIME-INR
INR: 1.14 (ref 0.00–1.49)
Prothrombin Time: 14.8 seconds (ref 11.6–15.2)

## 2015-04-19 LAB — C-REACTIVE PROTEIN: CRP: 14.4 mg/dL — ABNORMAL HIGH (ref <1.0)

## 2015-04-19 LAB — LACTIC ACID, PLASMA: Lactic Acid, Venous: 1.7 mmol/L (ref 0.5–2.0)

## 2015-04-19 LAB — I-STAT CG4 LACTIC ACID, ED
Lactic Acid, Venous: 1.62 mmol/L (ref 0.5–2.0)
Lactic Acid, Venous: 2.17 mmol/L (ref 0.5–2.0)

## 2015-04-19 LAB — BRAIN NATRIURETIC PEPTIDE: B Natriuretic Peptide: 15.1 pg/mL (ref 0.0–100.0)

## 2015-04-19 MED ORDER — TIOTROPIUM BROMIDE MONOHYDRATE 18 MCG IN CAPS
18.0000 ug | ORAL_CAPSULE | Freq: Every day | RESPIRATORY_TRACT | Status: DC
  Administered 2015-04-19: 18 ug via RESPIRATORY_TRACT
  Filled 2015-04-19: qty 5

## 2015-04-19 MED ORDER — INSULIN ASPART 100 UNIT/ML ~~LOC~~ SOLN
0.0000 [IU] | Freq: Three times a day (TID) | SUBCUTANEOUS | Status: DC
  Administered 2015-04-19: 8 [IU] via SUBCUTANEOUS
  Administered 2015-04-19: 9 [IU] via SUBCUTANEOUS
  Filled 2015-04-19 (×2): qty 1

## 2015-04-19 MED ORDER — POTASSIUM CHLORIDE CRYS ER 20 MEQ PO TBCR
40.0000 meq | EXTENDED_RELEASE_TABLET | Freq: Two times a day (BID) | ORAL | Status: DC
  Administered 2015-04-19 – 2015-04-23 (×9): 40 meq via ORAL
  Filled 2015-04-19 (×9): qty 2

## 2015-04-19 MED ORDER — METHYLPREDNISOLONE SODIUM SUCC 125 MG IJ SOLR
125.0000 mg | Freq: Once | INTRAMUSCULAR | Status: AC
  Administered 2015-04-19: 125 mg via INTRAVENOUS
  Filled 2015-04-19: qty 2

## 2015-04-19 MED ORDER — PREDNISONE 20 MG PO TABS
40.0000 mg | ORAL_TABLET | Freq: Every day | ORAL | Status: DC
  Administered 2015-04-20 – 2015-04-21 (×2): 40 mg via ORAL
  Filled 2015-04-19 (×2): qty 2

## 2015-04-19 MED ORDER — VENLAFAXINE HCL ER 37.5 MG PO CP24
37.5000 mg | ORAL_CAPSULE | Freq: Every day | ORAL | Status: DC
  Administered 2015-04-20 – 2015-04-23 (×4): 37.5 mg via ORAL
  Filled 2015-04-19 (×7): qty 1

## 2015-04-19 MED ORDER — ALPRAZOLAM 0.5 MG PO TABS
1.0000 mg | ORAL_TABLET | Freq: Three times a day (TID) | ORAL | Status: DC
  Administered 2015-04-19 – 2015-04-23 (×14): 1 mg via ORAL
  Filled 2015-04-19 (×13): qty 2
  Filled 2015-04-19: qty 4

## 2015-04-19 MED ORDER — ACETAMINOPHEN 650 MG RE SUPP: 650.0000 mg | Freq: Four times a day (QID) | RECTAL | Status: DC | PRN

## 2015-04-19 MED ORDER — VANCOMYCIN HCL IN DEXTROSE 1-5 GM/200ML-% IV SOLN
1000.0000 mg | Freq: Two times a day (BID) | INTRAVENOUS | Status: DC
  Administered 2015-04-19 – 2015-04-21 (×4): 1000 mg via INTRAVENOUS
  Filled 2015-04-19 (×5): qty 200

## 2015-04-19 MED ORDER — INSULIN GLARGINE 100 UNIT/ML ~~LOC~~ SOLN
55.0000 [IU] | Freq: Two times a day (BID) | SUBCUTANEOUS | Status: DC
  Administered 2015-04-19 (×2): 55 [IU] via SUBCUTANEOUS
  Filled 2015-04-19 (×4): qty 0.55

## 2015-04-19 MED ORDER — OXYCODONE-ACETAMINOPHEN 5-325 MG PO TABS
1.0000 | ORAL_TABLET | Freq: Three times a day (TID) | ORAL | Status: DC | PRN
Start: 2015-04-19 — End: 2015-04-21
  Administered 2015-04-19: 2 via ORAL
  Administered 2015-04-19: 1 via ORAL
  Administered 2015-04-20 – 2015-04-21 (×4): 2 via ORAL
  Filled 2015-04-19 (×2): qty 2
  Filled 2015-04-19: qty 1
  Filled 2015-04-19 (×3): qty 2

## 2015-04-19 MED ORDER — ASPIRIN EC 81 MG PO TBEC
81.0000 mg | DELAYED_RELEASE_TABLET | Freq: Every day | ORAL | Status: DC
  Administered 2015-04-19 – 2015-04-23 (×5): 81 mg via ORAL
  Filled 2015-04-19 (×5): qty 1

## 2015-04-19 MED ORDER — ACETAMINOPHEN 325 MG PO TABS
325.0000 mg | ORAL_TABLET | Freq: Once | ORAL | Status: DC
  Filled 2015-04-19: qty 1

## 2015-04-19 MED ORDER — IPRATROPIUM-ALBUTEROL 0.5-2.5 (3) MG/3ML IN SOLN
3.0000 mL | Freq: Four times a day (QID) | RESPIRATORY_TRACT | Status: DC
  Administered 2015-04-19 – 2015-04-22 (×10): 3 mL via RESPIRATORY_TRACT
  Filled 2015-04-19 (×11): qty 3

## 2015-04-19 MED ORDER — ALBUTEROL SULFATE (2.5 MG/3ML) 0.083% IN NEBU: 2.5000 mg | INHALATION_SOLUTION | RESPIRATORY_TRACT | Status: DC | PRN

## 2015-04-19 MED ORDER — BUDESONIDE 0.25 MG/2ML IN SUSP
0.2500 mg | Freq: Four times a day (QID) | RESPIRATORY_TRACT | Status: DC
  Administered 2015-04-19 – 2015-04-23 (×14): 0.25 mg via RESPIRATORY_TRACT
  Filled 2015-04-19 (×15): qty 2

## 2015-04-19 MED ORDER — OMEGA-3-ACID ETHYL ESTERS 1 G PO CAPS
1.0000 g | ORAL_CAPSULE | Freq: Every day | ORAL | Status: DC
  Administered 2015-04-19 – 2015-04-23 (×5): 1 g via ORAL
  Filled 2015-04-19 (×5): qty 1

## 2015-04-19 MED ORDER — VENLAFAXINE HCL ER 37.5 MG PO CP24
37.5000 mg | ORAL_CAPSULE | Freq: Every day | ORAL | Status: DC
  Administered 2015-04-19: 37.5 mg via ORAL
  Filled 2015-04-19 (×2): qty 1

## 2015-04-19 MED ORDER — OMEGA-3 FISH OIL 1000 MG PO CAPS: 1.0000 | ORAL_CAPSULE | Freq: Every day | ORAL | Status: DC

## 2015-04-19 MED ORDER — ACETAMINOPHEN 325 MG PO TABS
650.0000 mg | ORAL_TABLET | Freq: Once | ORAL | Status: AC
  Administered 2015-04-19: 650 mg via ORAL

## 2015-04-19 MED ORDER — INSULIN ASPART 100 UNIT/ML ~~LOC~~ SOLN
0.0000 [IU] | Freq: Three times a day (TID) | SUBCUTANEOUS | Status: DC
  Administered 2015-04-19: 15 [IU] via SUBCUTANEOUS
  Administered 2015-04-20: 11 [IU] via SUBCUTANEOUS
  Administered 2015-04-20: 15 [IU] via SUBCUTANEOUS
  Administered 2015-04-20: 11 [IU] via SUBCUTANEOUS
  Administered 2015-04-21 (×2): 5 [IU] via SUBCUTANEOUS

## 2015-04-19 MED ORDER — ALBUTEROL SULFATE (2.5 MG/3ML) 0.083% IN NEBU
2.5000 mg | INHALATION_SOLUTION | RESPIRATORY_TRACT | Status: DC
  Administered 2015-04-19 (×2): 2.5 mg via RESPIRATORY_TRACT
  Filled 2015-04-19 (×2): qty 3

## 2015-04-19 MED ORDER — DICLOFENAC SODIUM 1 % TD GEL
4.0000 g | Freq: Four times a day (QID) | TRANSDERMAL | Status: DC
  Administered 2015-04-19 – 2015-04-23 (×15): 4 g via TOPICAL
  Filled 2015-04-19 (×2): qty 100

## 2015-04-19 MED ORDER — PIPERACILLIN-TAZOBACTAM 3.375 G IVPB
3.3750 g | Freq: Three times a day (TID) | INTRAVENOUS | Status: DC
  Administered 2015-04-19 – 2015-04-21 (×6): 3.375 g via INTRAVENOUS
  Filled 2015-04-19 (×9): qty 50

## 2015-04-19 MED ORDER — GABAPENTIN 300 MG PO CAPS
300.0000 mg | ORAL_CAPSULE | Freq: Every day | ORAL | Status: DC | PRN
  Administered 2015-04-19 – 2015-04-22 (×2): 300 mg via ORAL
  Filled 2015-04-19 (×2): qty 1

## 2015-04-19 MED ORDER — OXYCODONE-ACETAMINOPHEN 5-325 MG PO TABS: 1.0000 | ORAL_TABLET | Freq: Three times a day (TID) | ORAL | Status: DC | PRN

## 2015-04-19 MED ORDER — HYDROCODONE-ACETAMINOPHEN 5-325 MG PO TABS
1.0000 | ORAL_TABLET | Freq: Four times a day (QID) | ORAL | Status: DC | PRN
Start: 2015-04-19 — End: 2015-04-19
  Administered 2015-04-19 (×2): 1 via ORAL
  Filled 2015-04-19 (×2): qty 1

## 2015-04-19 MED ORDER — ACETAMINOPHEN 325 MG PO TABS: 650.0000 mg | ORAL_TABLET | Freq: Four times a day (QID) | ORAL | Status: DC | PRN

## 2015-04-19 MED ORDER — GLIMEPIRIDE 1 MG PO TABS
1.0000 mg | ORAL_TABLET | Freq: Every day | ORAL | Status: DC
  Administered 2015-04-19 – 2015-04-20 (×2): 1 mg via ORAL
  Filled 2015-04-19 (×3): qty 1

## 2015-04-19 MED ORDER — CETIRIZINE HCL 1 MG/ML PO SYRP: 10.0000 mg | ORAL_SOLUTION | Freq: Every day | ORAL | Status: DC

## 2015-04-19 MED ORDER — INSULIN ASPART 100 UNIT/ML ~~LOC~~ SOLN: 0.0000 [IU] | Freq: Three times a day (TID) | SUBCUTANEOUS | Status: DC

## 2015-04-19 MED ORDER — IPRATROPIUM-ALBUTEROL 0.5-2.5 (3) MG/3ML IN SOLN
3.0000 mL | Freq: Once | RESPIRATORY_TRACT | Status: AC
  Administered 2015-04-19: 3 mL via RESPIRATORY_TRACT
  Filled 2015-04-19: qty 3

## 2015-04-19 MED ORDER — ONDANSETRON HCL 4 MG/2ML IJ SOLN: 4.0000 mg | Freq: Four times a day (QID) | INTRAMUSCULAR | Status: DC | PRN

## 2015-04-19 MED ORDER — INSULIN ASPART 100 UNIT/ML ~~LOC~~ SOLN
0.0000 [IU] | Freq: Every day | SUBCUTANEOUS | Status: DC
  Administered 2015-04-19: 4 [IU] via SUBCUTANEOUS
  Administered 2015-04-20: 5 [IU] via SUBCUTANEOUS

## 2015-04-19 MED ORDER — MAGNESIUM SULFATE IN D5W 10-5 MG/ML-% IV SOLN
1.0000 g | Freq: Once | INTRAVENOUS | Status: AC
  Administered 2015-04-19: 1 g via INTRAVENOUS
  Filled 2015-04-19: qty 100

## 2015-04-19 MED ORDER — SPIRONOLACTONE 25 MG PO TABS
25.0000 mg | ORAL_TABLET | Freq: Every day | ORAL | Status: DC
  Administered 2015-04-19 – 2015-04-23 (×5): 25 mg via ORAL
  Filled 2015-04-19 (×5): qty 1

## 2015-04-19 MED ORDER — MORPHINE-NALTREXONE 20-0.8 MG PO CPCR: 1.0000 | ORAL_CAPSULE | Freq: Every day | ORAL | Status: DC

## 2015-04-19 MED ORDER — SODIUM CHLORIDE 0.9 % IJ SOLN
3.0000 mL | Freq: Two times a day (BID) | INTRAMUSCULAR | Status: DC
  Administered 2015-04-19 – 2015-04-22 (×5): 3 mL via INTRAVENOUS
  Filled 2015-04-19: qty 3

## 2015-04-19 MED ORDER — TIZANIDINE HCL 2 MG PO TABS
2.0000 mg | ORAL_TABLET | Freq: Two times a day (BID) | ORAL | Status: DC | PRN
  Administered 2015-04-20: 2 mg via ORAL
  Filled 2015-04-19 (×3): qty 1

## 2015-04-19 MED ORDER — TORSEMIDE 20 MG PO TABS
60.0000 mg | ORAL_TABLET | Freq: Two times a day (BID) | ORAL | Status: DC
  Administered 2015-04-19 – 2015-04-20 (×3): 60 mg via ORAL
  Filled 2015-04-19 (×5): qty 3

## 2015-04-19 MED ORDER — CETIRIZINE HCL 5 MG/5ML PO SYRP
10.0000 mg | ORAL_SOLUTION | Freq: Every day | ORAL | Status: DC
  Administered 2015-04-19 – 2015-04-22 (×4): 10 mg via ORAL
  Filled 2015-04-19 (×6): qty 10

## 2015-04-19 MED ORDER — METHYLPREDNISOLONE SODIUM SUCC 125 MG IJ SOLR: 80.0000 mg | Freq: Once | INTRAMUSCULAR | Status: DC

## 2015-04-19 MED ORDER — IOHEXOL 350 MG/ML SOLN
100.0000 mL | Freq: Once | INTRAVENOUS | Status: AC | PRN
  Administered 2015-04-19: 100 mL via INTRAVENOUS

## 2015-04-19 MED ORDER — MACITENTAN 10 MG PO TABS: 10.0000 mg | ORAL_TABLET | Freq: Every day | ORAL | Status: DC

## 2015-04-19 MED ORDER — ONDANSETRON HCL 4 MG PO TABS
4.0000 mg | ORAL_TABLET | Freq: Four times a day (QID) | ORAL | Status: DC | PRN
  Administered 2015-04-22: 4 mg via ORAL
  Filled 2015-04-19: qty 1

## 2015-04-19 MED ORDER — PANTOPRAZOLE SODIUM 40 MG PO TBEC
40.0000 mg | DELAYED_RELEASE_TABLET | Freq: Every day | ORAL | Status: DC
  Administered 2015-04-19 – 2015-04-23 (×5): 40 mg via ORAL
  Filled 2015-04-19 (×5): qty 1

## 2015-04-19 MED ORDER — BUDESONIDE-FORMOTEROL FUMARATE 160-4.5 MCG/ACT IN AERO
2.0000 | INHALATION_SPRAY | Freq: Two times a day (BID) | RESPIRATORY_TRACT | Status: DC
  Administered 2015-04-19: 2 via RESPIRATORY_TRACT
  Filled 2015-04-19: qty 6

## 2015-04-19 MED ORDER — FLUTICASONE PROPIONATE 50 MCG/ACT NA SUSP
2.0000 | Freq: Every day | NASAL | Status: DC
  Administered 2015-04-19: 2 via NASAL
  Filled 2015-04-19: qty 16

## 2015-04-19 MED ORDER — SODIUM CHLORIDE 0.9 % IV SOLN
Freq: Once | INTRAVENOUS | Status: AC
  Administered 2015-04-19: 01:00:00 via INTRAVENOUS

## 2015-04-19 MED ORDER — VANCOMYCIN HCL 10 G IV SOLR
1500.0000 mg | Freq: Once | INTRAVENOUS | Status: AC
  Administered 2015-04-19: 1500 mg via INTRAVENOUS
  Filled 2015-04-19: qty 1500

## 2015-04-19 NOTE — ED Notes (Signed)
Pt lunch tray ordered.

## 2015-04-19 NOTE — Progress Notes (Addendum)
Patient is seen &examined. Please see today's H&P for the details. 49 y/o female with PMH of HTN, OSA,  CHF-diastolic HF, Advanced COPD on home Oxygen, with severe Pulmonary HTN, h/o substance abuse, presented with SOB, cough, fever. She is admitted with probable pneumonia, and SOB. We will cont IV atx, pend cultures, cont oxygen, bronchodilators. Patient is clinically stable. Mild hyperglycemia, she did not get insulin AM dose with meals. We will cont lantus+ISS. Cont monitor  [name] N 11:18 AM  Addendum: Patient localizes pain more to the great left  toe, elevated uric acid high suspicion for gout. ? Diuretic induced. D/w patient regarding treatment options, she want to try short steroids. Monitor DM closely while on prednisone  Wilman Tucker N 3:27 PM

## 2015-04-19 NOTE — ED Notes (Signed)
Unable to pull insulin from [facility].  Meds not up from pharmacy.  Spoke with pharmacist.

## 2015-04-19 NOTE — Progress Notes (Signed)
ANTIBIOTIC CONSULT NOTE - INITIAL  Pharmacy Consult for Vancomycin/Zosyn  Indication: rule out pneumonia  Allergies  Allergen Reactions  . Simvastatin Other (See Comments)    Severe leg pain and burning  . Sulfamethoxazole-Trimethoprim Nausea And Vomiting    Causes Projectile Vomiting  . Zolpidem Tartrate Other (See Comments)    Chest pain  . Azithromycin Other (See Comments)    Resistant to med  . Ceftin [Cefuroxime] Swelling and Other (See Comments)    MOUTH SWELLING  . Doxycycline Other (See Comments)    Resistant to med  . Latex Itching and Rash    Also burning sensations  . Metformin And Related Diarrhea    Severe diarrhea  . Metolazone Other (See Comments)    MYALGIAS  . Metronidazole Nausea And Vomiting  . Ciprofloxacin Nausea Only  . Tramadol Itching and Nausea Only    Patient Measurements: Height: 5\' 8"  (172.7 cm) Weight: 284 lb (128.822 kg) IBW/kg (Calculated) : 63.9   Vital Signs: Temp: 100.1 F (37.8 C) (07/24 2105) Temp Source: Oral (07/24 2105) BP: 106/61 mmHg (07/25 0251) Pulse Rate: 95 (07/25 0251)  Labs:  Recent Labs  04/18/15 2135  WBC 8.6  HGB 13.2  PLT 179  CREATININE 1.17*   Estimated Creatinine Clearance: 83.5 mL/min (by C-G formula based on Cr of 1.17).  Microbiology: Recent Results (from the past 720 hour(s))  MRSA PCR Screening     Status: None   Collection Time: 04/03/15  5:13 AM  Result Value Ref Range Status   MRSA by PCR NEGATIVE NEGATIVE Final    Comment:        The GeneXpert MRSA Assay (FDA approved for NASAL specimens only), is one component of a comprehensive MRSA colonization surveillance program. It is not intended to diagnose MRSA infection nor to guide or monitor treatment for MRSA infections.     Medical History: Past Medical History  Diagnosis Date  . Hypertension   . Hyperlipidemia   . Chronic headache   . Fibromyalgia     daily narcotics  . Anxiety     hx chronic BZ use, stopped 07/2010  .  Anemia   . Pulmonary sarcoidosis     unimpressive CT chest 2011  . Colonic polyp   . GERD (gastroesophageal reflux disease)   . ALLERGIC RHINITIS   . Asthma   . CHF (congestive heart failure)     Diastolic with fluid overload, May, 2012, LVEF 60%  . Morbid obesity   . Depression   . Panic attacks   . Diabetes mellitus   . COPD (chronic obstructive pulmonary disease)     on home O2, moderate airflow obstruction, suspect d/t emphysema  . Obesity   . Elevated LFTs 09/2011  . Ovarian cyst   . Chronic back pain   . Sleep apnea      CPAP  . Fatty liver   . Esophagitis   . Gastritis   . Internal hemorrhoids   . Adenomatous colon polyp 12/03/09    [facility] in [place], [place]    Assessment: 49 y/o F with recent DC on 7/17, back with shortness of breath, WBC WNL, renal function ok, new RUL infiltrate on CT, to begin broad spectrum anti-biotics.   Goal of Therapy:  Vancomycin trough level 15-20 mcg/ml  Plan:  -Vancomycin 1500 mg IV x 1, then 1000 mg IV q12h -Zosyn 3.375G IV q8h to be infused over 4 hours -Trend WBC, temp, renal function  -Drug levels as indicated   Valiant Dills,  Jeneen Rinks 04/19/2015,3:14 AM

## 2015-04-19 NOTE — ED Notes (Signed)
Called vascular.  They were unaware that pt needed [place].  She is now on the list.

## 2015-04-19 NOTE — ED Notes (Signed)
Pt assisted to bedside commode without difficulty. Vital signs stable. Pt updated on plan of care. No signs of distress noted at present.

## 2015-04-19 NOTE — Progress Notes (Signed)
VASCULAR LAB PRELIMINARY  PRELIMINARY  PRELIMINARY  PRELIMINARY  Left lower extremity venous Doppler completed.    Preliminary report:  There is no DVT or SVT noted in the left lower extremity.   Crimson Dubberly, RVT 04/19/2015, 2:56 PM

## 2015-04-19 NOTE — ED Notes (Signed)
Pt cbg 405 

## 2015-04-19 NOTE — Telephone Encounter (Signed)
I have called and left a message with Edda to inquire about participation in Pulmonary Rehab per Dr. Claris Gladden referral. Will send letter in mail and follow up.

## 2015-04-19 NOTE — ED Notes (Signed)
Admitting MD in w/pt when returned from u/s.

## 2015-04-19 NOTE — ED Notes (Signed)
Patient stated she was picking at an ingrown toenail on her left great toe.

## 2015-04-19 NOTE — ED Notes (Signed)
Called pharmacy again b/c unable to pull insulin from [facility] and bs is rising.

## 2015-04-19 NOTE — H&P (Addendum)
[facility] History and Physical  [name] EHU:[id number] DOB: 1966/08/20 DOA: 04/18/2015  Referring physician: Dr. Vallery Ridge. PCP: Gwendolyn Grant, MD  Specialists: Dr. Aundra Dubin. Cardiologist.  Chief Complaint: Shortness of breath.  HPI: [name] is a 49 y.o. female with history of severe pulmonary hypertension, COPD, diabetes mellitus, OSA and chronic kidney disease presents to the ER because of shortness of breath and palpitations. Patient states she has been having shortness of breath and palpitations since morning. Denies any chest pain and has been having some nonproductive cough. In addition patient has been complaining of left ankle pain. Patient left ankle pain also started from yesterday morning. In the ER patient was found to be febrile and CT angiogram of the chest done shows possible new infiltrates concerning for pneumonia. On exam patient ankle is tender and x-rays revealed mild edema. Patient has been admitted for further management. Patient denies any nausea vomiting abdominal pain diarrhea. Patient states she was discharged on [place] for her pulmonary hypertension which patient was not able to feel it.   Review of Systems: As presented in the history of presenting illness, rest negative.  Past Medical History  Diagnosis Date  . Hypertension   . Hyperlipidemia   . Chronic headache   . Fibromyalgia     daily narcotics  . Anxiety     hx chronic BZ use, stopped 07/2010  . Anemia   . Pulmonary sarcoidosis     unimpressive CT chest 2011  . Colonic polyp   . GERD (gastroesophageal reflux disease)   . ALLERGIC RHINITIS   . Asthma   . CHF (congestive heart failure)     Diastolic with fluid overload, May, 2012, LVEF 60%  . Morbid obesity   . Depression   . Panic attacks   . Diabetes mellitus   . COPD (chronic obstructive pulmonary disease)     on home O2, moderate airflow obstruction, suspect d/t emphysema  . Obesity   . Elevated LFTs 09/2011  .  Ovarian cyst   . Chronic back pain   . Sleep apnea      CPAP  . Fatty liver   . Esophagitis   . Gastritis   . Internal hemorrhoids   . Adenomatous colon polyp 12/03/09    [facility] [place], [place]   Past Surgical History  Procedure Laterality Date  . Polypectomy  2011  . Lumbar microdiscectomy  07/06/2011    R L4-5, stern  . Back surgery    . Carpal tunnel release    . Steroid spinal injections    . Hysteroscopy w/d&c N/A 03/25/2013    Procedure: DILATATION AND CURETTAGE /HYSTEROSCOPY;  Surgeon: [name], MD;  Location: [facility];  Service: Gynecology;  Laterality: N/A;  . Dilation and curettage of uterus    . Cardiac catheterization N/A 03/17/2015    Procedure: Right Heart Cath;  Surgeon: Larey Dresser, MD;  Location: No Name [facility];  Service: Cardiovascular;  Laterality: N/A;   Social History:  reports that she quit smoking about 4 months ago. Her smoking use included Cigarettes. She has a 7.25 pack-year smoking history. She has never used smokeless tobacco. She reports that she drinks alcohol. She reports that she uses illicit drugs (Cocaine and Marijuana). Where does patient live home. Can patient participate in ADLs? Yes.  Allergies  Allergen Reactions  . Simvastatin Other (See Comments)    Severe leg pain and burning  . Sulfamethoxazole-Trimethoprim Nausea And Vomiting    Causes Projectile Vomiting  .  Zolpidem Tartrate Other (See Comments)    Chest pain  . Azithromycin Other (See Comments)    Resistant to med  . Ceftin [Cefuroxime] Swelling and Other (See Comments)    MOUTH SWELLING  . Doxycycline Other (See Comments)    Resistant to med  . Latex Itching and Rash    Also burning sensations  . Metformin And Related Diarrhea    Severe diarrhea  . Metolazone Other (See Comments)    MYALGIAS  . Metronidazole Nausea And Vomiting  . Ciprofloxacin Nausea Only  . Tramadol Itching and Nausea Only    Family History:  Family History  Problem Relation  Age of Onset  . Hypertension Mother   . Heart disease Mother   . Emphysema Father   . Hypertension Father   . Stomach cancer Father   . Heart disease Father   . Allergies Brother   . Stomach cancer Brother   . Congestive Heart Failure Brother   . Diabetes type II Brother   . Heart disease Sister   . Diabetes type II Sister   . Heart attack Sister   . Stroke Sister   . Hypertension Sister   . Heart failure Sister     diastolic  . Hypertension Sister       Prior to Admission medications   Medication Sig Start Date End Date Taking? Authorizing Provider  albuterol (PROVENTIL) (2.5 MG/3ML) 0.083% nebulizer solution INHALE CONTENTS OF 1 VIAL VIA NEBULIZER EVERY 6 HOURS AS NEEDED FOR WHEEZING OR SHORTNESS OF BREATH 03/30/15  Yes Rowe Clack, MD  ALPRAZolam Duanne Moron) 1 MG tablet Take 1 tablet (1 mg total) by mouth 3 (three) times daily. 01/25/15  Yes Rowe Clack, MD  aspirin EC 81 MG tablet Take 81 mg by mouth daily.   Yes Historical Provider, MD  BIOTIN PO Take 1 capsule by mouth daily.   Yes Historical Provider, MD  budesonide-formoterol (SYMBICORT) 160-4.5 MCG/ACT inhaler Inhale 2 puffs into the lungs 2 (two) times daily. 04/01/15  Yes Tammy S Parrett, NP  cetirizine (ZYRTEC) 1 MG/ML syrup Take 10 mLs (10 mg total) by mouth at bedtime. 01/06/15  Yes Rowe Clack, MD  diclofenac sodium (VOLTAREN) 1 % GEL Apply 4 g topically 4 (four) times daily. 04/01/15  Yes Rowe Clack, MD  EMBEDA 20-0.8 MG CPCR Take 1 tablet by mouth daily. 03/09/15  Yes Historical Provider, MD  esomeprazole (NEXIUM) 40 MG capsule Take 40 mg by mouth 2 (two) times daily before a meal.   Yes Historical Provider, MD  fluticasone (FLONASE) 50 MCG/ACT nasal spray Place 2 sprays into both nostrils daily. Patient taking differently: Place 2 sprays into both nostrils daily as needed for allergies or rhinitis.  10/19/14  Yes Rowe Clack, MD  gabapentin (NEURONTIN) 300 MG capsule Take 300 mg by mouth daily  as needed (neuropathy pain).    Yes Historical Provider, MD  glimepiride (AMARYL) 1 MG tablet Take 1 mg by mouth daily with breakfast.   Yes Historical Provider, MD  hydrOXYzine (ATARAX/VISTARIL) 10 MG tablet TAKE 1 TABLET BY MOUTH THREE TIMES DAILY AS NEEDED FOR ITCHING 08/27/14  Yes Biagio Borg, MD  insulin aspart (NOVOLOG FLEXPEN) 100 UNIT/ML FlexPen Inject 5 Units into the skin 3 (three) times daily with meals. Patient taking differently: Inject 6-10 Units into the skin 3 (three) times daily with meals.  12/28/14  Yes Shanker Kristeen Mans, MD  Insulin Glargine (LANTUS SOLOSTAR) 100 UNIT/ML Solostar Pen Inject 55 Units into the  skin 2 (two) times daily. Patient taking differently: Inject 40 Units into the skin 2 (two) times daily.  04/11/15  Yes Barton Dubois, MD  Multiple Vitamin (MULTIVITAMIN) capsule Take 1 capsule by mouth once a week. Mondays   Yes Historical Provider, MD  omega-3 fish oil (MAXEPA) 1000 MG CAPS capsule Take 1 capsule by mouth daily.   Yes Historical Provider, MD  potassium chloride SA (K-DUR,KLOR-CON) 20 MEQ tablet Take 2 tablets (40 mEq total) by mouth 2 (two) times daily. 03/21/15  Yes Verlee Monte, MD  PROAIR HFA 108 (90 BASE) MCG/ACT inhaler INHALE 2 PUFFS BY MOUTH EVERY 6 HOURS AS NEEDED FOR WHEEZING OR SHORTNESS OF BREATH 03/30/15  Yes Rowe Clack, MD  spironolactone (ALDACTONE) 25 MG tablet Take 1 tablet (25 mg total) by mouth daily. 04/13/15  Yes Barton Dubois, MD  tiotropium (SPIRIVA HANDIHALER) 18 MCG inhalation capsule Place 1 capsule (18 mcg total) into inhaler and inhale daily. 04/01/15  Yes Tammy S Parrett, NP  tiZANidine (ZANAFLEX) 2 MG tablet Take 2 mg by mouth 2 (two) times daily as needed for muscle spasms.  03/10/15  Yes Historical Provider, MD  torsemide (DEMADEX) 20 MG tablet Take 3 tablets (60 mg total) by mouth 2 (two) times daily. 04/11/15  Yes Barton Dubois, MD  venlafaxine XR (EFFEXOR XR) 37.5 MG 24 hr capsule Take 1 capsule (37.5 mg total) by mouth daily  with breakfast. 11/13/14  Yes Rowe Clack, MD  Macitentan 10 MG TABS Take 10 mg by mouth daily. Patient not taking: Reported on 04/16/2015 04/11/15   Barton Dubois, MD    Physical Exam: Filed Vitals:   04/19/15 0100 04/19/15 0115 04/19/15 0130 04/19/15 0251  BP: 124/49 117/56 118/54 106/61  Pulse: 103 107 106 95  Temp:      TempSrc:      Resp: 33 19 18 21   Height:      Weight:      SpO2: 96% 97% 98% 96%     General:  Obese not in distress.  Eyes: Anicteric no pallor.  ENT: No discharge from the ears eyes nose and mouth.  Neck: No mass felt.  Cardiovascular: S1 and S2 heard.  Respiratory: No rhonchi or crepitations.  Abdomen: Soft nontender bowel sounds present.  Skin: No rash.  Musculoskeletal: Swelling of the left ankle with tenderness.  Psychiatric: Appears normal.  Neurologic: Alert awake oriented to time place and person. Moves all extremities.  Labs on Admission:  Basic Metabolic Panel:  Recent Labs Lab 04/18/15 2135 04/19/15 0144  NA 127*  --   K 3.4*  --   CL 88*  --   CO2 27  --   GLUCOSE 228*  --   BUN 9  --   CREATININE 1.17*  --   CALCIUM 8.4*  --   MG  --  1.5*   Liver Function Tests:  Recent Labs Lab 04/18/15 2135  AST 34  ALT 60*  ALKPHOS 96  BILITOT 1.2  PROT 7.3  ALBUMIN 3.3*   No results for input(s): LIPASE, AMYLASE in the last 168 hours. No results for input(s): AMMONIA in the last 168 hours. CBC:  Recent Labs Lab 04/18/15 2135  WBC 8.6  NEUTROABS 6.5  HGB 13.2  HCT 40.2  MCV 77.5*  PLT 179   Cardiac Enzymes: No results for input(s): CKTOTAL, CKMB, CKMBINDEX, TROPONINI in the last 168 hours.  BNP (last 3 results)  Recent Labs  12/18/14 1846 03/19/15 0441 04/18/15 2135  BNP 231.0*  61.9 15.1    ProBNP (last 3 results)  Recent Labs  07/20/14 1817  PROBNP 39.7    CBG: No results for input(s): GLUCAP in the last 168 hours.  Radiological Exams on Admission: Dg Chest 2 View  04/18/2015    CLINICAL DATA:  Shortness of breath.  History of sarcoidosis  EXAM: CHEST  2 VIEW  COMPARISON:  April 12, 2015 and March 15, 2015 chest radiograph; chest CT March 16, 2015  FINDINGS: There is stable scarring/ fibrotic type change in the lung bases, more severe on the right than on the left. There is also mild scarring in the medial left upper lobe. There is no frank edema or consolidation. The heart is upper normal in size with pulmonary vascularity within normal limits. No adenopathy apparent. No bone lesions.  IMPRESSION: Areas of scarring, most pronounced in the right lower lobe, stable. No new opacity. No change in cardiac silhouette.   Electronically Signed   By: Lowella Grip III M.D.   On: 04/18/2015 21:53   Dg Ankle 2 Views Left  04/19/2015   CLINICAL DATA:  Left ankle pain and swelling.  No known trauma.  EXAM: LEFT ANKLE - 2 VIEW  COMPARISON:  None.  FINDINGS: No fracture or dislocation. The alignment and joint spaces are maintained. The ankle mortise is preserved. No erosion or periosteal reaction. Mild soft tissue edema is noted. No radiopaque foreign body.  IMPRESSION: Soft tissue edema without associated osseous abnormality.   Electronically Signed   By: Jeb Levering M.D.   On: 04/19/2015 01:20   Ct Angio Chest Pe W/cm &/or Wo Cm  04/19/2015   CLINICAL DATA:  Dyspnea and palpitations.  EXAM: CT ANGIOGRAPHY CHEST WITH CONTRAST  TECHNIQUE: Multidetector CT imaging of the chest was performed using the standard protocol during bolus administration of intravenous contrast. Multiplanar CT image reconstructions and MIPs were obtained to evaluate the vascular anatomy.  CONTRAST:  161mL OMNIPAQUE IOHEXOL 350 MG/ML SOLN  COMPARISON:  03/16/2015  FINDINGS: Cardiovascular: There is good opacification of the pulmonary arteries. There is no pulmonary embolism. The thoracic aorta is normal in caliber and intact.  Lungs: There is right upper lobe confluent airspace opacity, new. This is superimposed on the  chronic centrilobular and paraseptal emphysematous changes as well as the reticulonodular peribronchovascular opacities which are likely associated with the known sarcoidosis. This could represent infectious infiltrate or aspiration.  Central airways: Patent  Effusions: None  Lymphadenopathy: Mild symmetric hilar adenopathy.  Esophagus: Unremarkable  Upper abdomen: No significant abnormality  Musculoskeletal: No significant abnormality  Review of the MIP images confirms the above findings.  IMPRESSION: 1. Negative for pulmonary embolism 2. New right upper lobe airspace opacity which could represent infectious infiltrate in car aspiration superimposed on the chronic changes of sarcoidosis and emphysema.   Electronically Signed   By: Andreas Newport M.D.   On: 04/19/2015 02:46     Assessment/Plan Active Problems:   PULMONARY SARCOIDOSIS   Obstructive sleep apnea   Diabetes type 2, uncontrolled   Chronic diastolic CHF (congestive heart failure)   COPD (chronic obstructive pulmonary disease) with emphysema   Pulmonary HTN   Acute on chronic respiratory failure with hypoxia   Acute respiratory failure   1. Acute respiratory failure with hypoxia - could be multifactorial but since patient's CT angio shows infiltrates patient has been placed on empiric antibiotics for healthcare associated pneumonia with vancomycin and Zosyn. Patient shortness of breath also could be from possible developing sepsis. Patient also has  history of severe pulmonary hypertension for which patient was unable to take her Macitentan. This will be resumed. 2. Possible developing sepsis - source could be pneumonia and also left ankle swelling and UTI. Follow urine cultures. Left ankle arthrocentesis will be required for which orthopedics needs to be consulted. Patient has been placed on sepsis protocol. Follow procalcitonin levels and lactic acid levels. Patient's diuretics may have to be held if lactic acid is  elevated. 3. Diabetes mellitus type 2 uncontrolled - if repeat metabolic panel shows elevated anion gap and may need IV insulin infusion. Closely follow metabolic panel and CBGs. 4. Left ankle and left great toe swelling and pain - differential diagnoses include infection gout or sarcoid related. See #2. Check uric acid levels and sedimentation rate and CRP. 5. History of severe pulmonary hypertension and diastolic CHF - may have to hold diuretics if lactic acid is elevated. 6. Chronic kidney disease stage III - creatinine appears to be at baseline. 7. COPD - presently not wheezing. Closely observe. Continue inhalers. 8. History of only sarcoidosis.  I have reviewed patient's old charts and labs. Personally reviewed patient's chest x-ray.  Addendum - discussed with on-call orthopedic surgeon Dr. Noralee Chars. Dr. Noralee Chars as advised to place patient on posterior splint of the left ankle. Orthopedics will be seeing patient in consult.   DVT Prophylaxis SCDs as patient may need procedure for the left ankle.  Code Status: Full code.  Family Communication: Discussed with patient.  Disposition Plan: Admit to inpatient.    Kaide Gage N. [facility] Pager [phone].  If 7PM-7AM, please contact night-coverage [email or web address] Password [email or web address] 04/19/2015, 3:12 AM

## 2015-04-19 NOTE — Consult Note (Signed)
Name: [name] MRN: [id number] DOB: 1966-08-13    ADMISSION DATE:  04/18/2015 CONSULTATION DATE:  04/19/2015  REFERRING MD :  Dr. Daleen Anderson  CHIEF COMPLAINT:  SOB  BRIEF PATIENT DESCRIPTION: 49 year old female with pulmonary sarcoidosis and pulmonary HTN (RA pt) admitted to [facility] 7/24 for respiratory failure secondary to presumed HCAP/sepsis. PCCM to consult.  SIGNIFICANT EVENTS    STUDIES:  6/22 RHC> RA mean 16, RV 73/22, PA 70/32, mean 47, PCWP mean 20. Oxygen saturations:PA 69% AO 96% Cardiac Output (Fick) 6.56 Cardiac Index (Fick) 2.74, PVR 4.1 WU, Cardiac Output (Thermo) 4.95, Cardiac Index (Thermo) 2.07, PVR 5.5 WU CTA chest 7/25 > Negative for PE, new RUL opacification LE Doppler 7/25 >>>   HISTORY OF PRESENT ILLNESS:  49 year old female with complicated medical history as below, which includes Pulmonary sarcoidosis, OSA (on nocturnal BiPAP), COPD on home O2 3-4L (all followed by [name]), She is followed by cardiology for severe pulmonary HTN and CHF, DM, and cocaine abuse. She has had several recent hospitalizations, most recently, she was admitted from 7/8 - 7/17 for an exacerbation of her COPD. She presented for that admission complaining of SOB/wheezing. She was treated for CODD exacerbation and acute on chronic heart failure with steroids, nebulized bronchodilators, antibiotics, and diuresis, and improved to the degree that she was discharged to home 7/17. She had a RHC during that hospitalization which demonstrated mildly elevated L heart filling pressure and moderately elevated R heart filling pressure, severe PAH with elevated PVR. Medication changes at discharge included increasing dose of demadex, and starting Macitenan. She was seen in pulmonary office by [name] 7/22 at which time she reported feeling slightly better, but she also has not been able to obtain her [place] from pharmacy as they did not have it. She did not appear fluid overloaded at that time.  Since that  time she reports that she was still not back to baseline, and then Sunday 7/24 she had a very difficult day. She felt much more SOB, especially with ambulation. She developed chest pain that was all over her chest and was worse with cough and deep breathing. Cough was intermittent and non-productive. Her dry weight is 284 and she states that her scale read 189 on the day of admission. She denies wheeze. Of note she finished her course of steroids about one week ago. She was ambulating on her typical 4L O2 and became very SOB with dizziness. She also had palpitations that she could feel throughout her entire chest and this was very alarming to her. She called her sister, who came to her house, and felt that [name] was too short of breath to be taken to the hospital by personal car, and EMS was called. Upon their arrival she was tachycardic in the 140s and hypoxemic to 85% on her home 4L. She presented to ED with cc SOB, palpitations, and L ankle pain. CXR of her chest shwoed no acute process, CTA was ordered and showed no PE, but did show a questionable RUL opacification. She was given steroids, nebs, and was started on HCAP coverage. Reports most relief came after Solu-medrol IV push.  She was admitted to the hospitalist team. 7/25 she requested Pulmonary consultation as she is typically followed by [name]. Says she has had feelings of being "done with all of this", "ready to give up at times".   PAST MEDICAL HISTORY :   has a past medical history of Hypertension; Hyperlipidemia; Chronic headache; Fibromyalgia;  Anxiety; Anemia; Pulmonary sarcoidosis; Colonic polyp; GERD (gastroesophageal reflux disease); ALLERGIC RHINITIS; Asthma; CHF (congestive heart failure); Morbid obesity; Depression; Panic attacks; Diabetes mellitus; COPD (chronic obstructive pulmonary disease); Obesity; Elevated LFTs (09/2011); Ovarian cyst; Chronic back pain; Sleep apnea; Fatty liver; Esophagitis; Gastritis; Internal hemorrhoids;  Adenomatous colon polyp (12/03/09); Hypoxia (03/2015); and Shortness of breath dyspnea.  has past surgical history that includes Polypectomy (2011); Lumbar microdiscectomy (07/06/2011); Back surgery; Carpal tunnel release; steroid spinal injections; Hysteroscopy w/D&C (N/A, 03/25/2013); Dilation and curettage of uterus; and Cardiac catheterization (N/A, 03/17/2015). Prior to Admission medications   Medication Sig Start Date End Date Taking? Authorizing Provider  albuterol (PROVENTIL) (2.5 MG/3ML) 0.083% nebulizer solution INHALE CONTENTS OF 1 VIAL VIA NEBULIZER EVERY 6 HOURS AS NEEDED FOR WHEEZING OR SHORTNESS OF BREATH 03/30/15  Yes Rowe Clack, MD  ALPRAZolam Duanne Moron) 1 MG tablet Take 1 tablet (1 mg total) by mouth 3 (three) times daily. 01/25/15  Yes Rowe Clack, MD  aspirin EC 81 MG tablet Take 81 mg by mouth daily.   Yes Historical Provider, MD  BIOTIN PO Take 1 capsule by mouth daily.   Yes Historical Provider, MD  budesonide-formoterol (SYMBICORT) 160-4.5 MCG/ACT inhaler Inhale 2 puffs into the lungs 2 (two) times daily. 04/01/15  Yes Tammy S Parrett, NP  cetirizine (ZYRTEC) 1 MG/ML syrup Take 10 mLs (10 mg total) by mouth at bedtime. 01/06/15  Yes Rowe Clack, MD  diclofenac sodium (VOLTAREN) 1 % GEL Apply 4 g topically 4 (four) times daily. 04/01/15  Yes Rowe Clack, MD  esomeprazole (NEXIUM) 40 MG capsule Take 40 mg by mouth 2 (two) times daily before a meal.   Yes Historical Provider, MD  fluticasone (FLONASE) 50 MCG/ACT nasal spray Place 2 sprays into both nostrils daily. Patient taking differently: Place 2 sprays into both nostrils daily as needed for allergies or rhinitis.  10/19/14  Yes Rowe Clack, MD  gabapentin (NEURONTIN) 300 MG capsule Take 300 mg by mouth daily as needed (neuropathy pain).    Yes Historical Provider, MD  glimepiride (AMARYL) 1 MG tablet Take 1 mg by mouth daily with breakfast.   Yes Historical Provider, MD  hydrOXYzine (ATARAX/VISTARIL) 10 MG  tablet TAKE 1 TABLET BY MOUTH THREE TIMES DAILY AS NEEDED FOR ITCHING 08/27/14  Yes Biagio Borg, MD  insulin aspart (NOVOLOG FLEXPEN) 100 UNIT/ML FlexPen Inject 5 Units into the skin 3 (three) times daily with meals. Patient taking differently: Inject 6-10 Units into the skin 3 (three) times daily with meals.  12/28/14  Yes Shanker Kristeen Mans, MD  Insulin Glargine (LANTUS SOLOSTAR) 100 UNIT/ML Solostar Pen Inject 55 Units into the skin 2 (two) times daily. Patient taking differently: Inject 40 Units into the skin 2 (two) times daily.  04/11/15  Yes Barton Dubois, MD  Multiple Vitamin (MULTIVITAMIN) capsule Take 1 capsule by mouth once a week. Mondays   Yes Historical Provider, MD  omega-3 fish oil (MAXEPA) 1000 MG CAPS capsule Take 1 capsule by mouth daily.   Yes Historical Provider, MD  potassium chloride SA (K-DUR,KLOR-CON) 20 MEQ tablet Take 2 tablets (40 mEq total) by mouth 2 (two) times daily. 03/21/15  Yes Verlee Monte, MD  PROAIR HFA 108 (90 BASE) MCG/ACT inhaler INHALE 2 PUFFS BY MOUTH EVERY 6 HOURS AS NEEDED FOR WHEEZING OR SHORTNESS OF BREATH 03/30/15  Yes Rowe Clack, MD  spironolactone (ALDACTONE) 25 MG tablet Take 1 tablet (25 mg total) by mouth daily. 04/13/15  Yes Barton Dubois, MD  tiotropium (  SPIRIVA HANDIHALER) 18 MCG inhalation capsule Place 1 capsule (18 mcg total) into inhaler and inhale daily. 04/01/15  Yes Tammy S Parrett, NP  tiZANidine (ZANAFLEX) 2 MG tablet Take 2 mg by mouth 2 (two) times daily as needed for muscle spasms.  03/10/15  Yes Historical Provider, MD  torsemide (DEMADEX) 20 MG tablet Take 3 tablets (60 mg total) by mouth 2 (two) times daily. 04/11/15  Yes Barton Dubois, MD  venlafaxine XR (EFFEXOR XR) 37.5 MG 24 hr capsule Take 1 capsule (37.5 mg total) by mouth daily with breakfast. 11/13/14  Yes Rowe Clack, MD  Macitentan 10 MG TABS Take 10 mg by mouth daily. Patient not taking: Reported on 04/16/2015 04/11/15   Barton Dubois, MD   Allergies  Allergen  Reactions  . Simvastatin Other (See Comments)    Severe leg pain and burning  . Sulfamethoxazole-Trimethoprim Nausea And Vomiting    Causes Projectile Vomiting  . Zolpidem Tartrate Other (See Comments)    Chest pain  . Azithromycin Other (See Comments)    Resistant to med  . Ceftin [Cefuroxime] Swelling and Other (See Comments)    MOUTH SWELLING  . Doxycycline Other (See Comments)    Resistant to med  . Latex Itching and Rash    Also burning sensations  . Metformin And Related Diarrhea    Severe diarrhea  . Metolazone Other (See Comments)    MYALGIAS  . Metronidazole Nausea And Vomiting  . Morphine-Naltrexone Palpitations  . Ciprofloxacin Nausea Only  . Tramadol Itching and Nausea Only    FAMILY HISTORY:  family history includes Allergies in her brother; Congestive Heart Failure in her brother; Diabetes type II in her brother and sister; Emphysema in her father; Heart attack in her sister; Heart disease in her father, mother, and sister; Heart failure in her sister; Hypertension in her father, mother, sister, and sister; Stomach cancer in her brother and father; Stroke in her sister. SOCIAL HISTORY:  reports that she quit smoking about 4 months ago. Her smoking use included Cigarettes. She has a 7.25 pack-year smoking history. She has never used smokeless tobacco. She reports that she drinks alcohol. She reports that she uses illicit drugs (Cocaine and Marijuana).  REVIEW OF SYSTEMS:   Bolds are positive  Constitutional: weight loss, gain, night sweats, Fevers, chills, fatigue .  HEENT: headaches, Sore throat, sneezing, nasal congestion, post nasal drip, Difficulty swallowing, Tooth/dental problems, visual complaints visual changes, ear ache CV:  chest pain, radiates: ,Orthopnea, PND, swelling in lower extremities, dizziness, palpitations, syncope.  GI  heartburn, indigestion, abdominal pain, nausea, vomiting, diarrhea, change in bowel habits, loss of appetite, bloody stools.    Resp: cough, non-productive: , hemoptysis, dyspnea, chest pain, pleuritic.  Skin: rash or itching or icterus GU: dysuria, change in color of urine, urgency or frequency. flank pain, hematuria  MS: joint pain or swelling L>R ankles. decreased range of motion  Psych: change in mood or affect. depression or anxiety.  Neuro: difficulty with speech, weakness, numbness, ataxia    SUBJECTIVE:   VITAL SIGNS: Temp:  [97.7 F (36.5 C)-101 F (38.3 C)] 97.7 F (36.5 C) (07/25 1646) Pulse Rate:  [69-112] 98 (07/25 1646) Resp:  [18-36] 21 (07/25 1646) BP: (89-136)/(35-95) 127/65 mmHg (07/25 1646) SpO2:  [90 %-100 %] 97 % (07/25 1705) Weight:  [128.822 kg (284 lb)] 128.822 kg (284 lb) (07/24 2105)  PHYSICAL EXAMINATION: General:  Obese female in mild respiratory distress Neuro:  Alert, oriented, non-focal HEENT:  [place]/AT, JVD difficult to discern  due to neck girth, PERRL Cardiovascular:  RRR, no MRG noted Lungs:  Coarse bilateral bases, distant Abdomen:  Obese, soft, non-tender, non-distended Musculoskeletal:  No acute deformity or ROM limitation, trace edema, most notably to lateral aspect of ankle bilaterally. L ankle does not appear read, or feel warm.  Skin:  Grossly intact   Recent Labs Lab 04/18/15 2135 04/19/15 0728  NA 127* 129*  K 3.4* 4.3  CL 88* 94*  CO2 27 24  BUN 9 12  CREATININE 1.17* 1.08*  GLUCOSE 228* 404*    Recent Labs Lab 04/18/15 2135 04/19/15 0728  HGB 13.2 12.9  HCT 40.2 39.0  WBC 8.6 8.1  PLT 179 151   Dg Chest 2 View  04/18/2015   CLINICAL DATA:  Shortness of breath.  History of sarcoidosis  EXAM: CHEST  2 VIEW  COMPARISON:  April 12, 2015 and March 15, 2015 chest radiograph; chest CT March 16, 2015  FINDINGS: There is stable scarring/ fibrotic type change in the lung bases, more severe on the right than on the left. There is also mild scarring in the medial left upper lobe. There is no frank edema or consolidation. The heart is upper normal in size with  pulmonary vascularity within normal limits. No adenopathy apparent. No bone lesions.  IMPRESSION: Areas of scarring, most pronounced in the right lower lobe, stable. No new opacity. No change in cardiac silhouette.   Electronically Signed   By: Lowella Grip III M.D.   On: 04/18/2015 21:53   Dg Ankle 2 Views Left  04/19/2015   CLINICAL DATA:  Left ankle pain and swelling.  No known trauma.  EXAM: LEFT ANKLE - 2 VIEW  COMPARISON:  None.  FINDINGS: No fracture or dislocation. The alignment and joint spaces are maintained. The ankle mortise is preserved. No erosion or periosteal reaction. Mild soft tissue edema is noted. No radiopaque foreign body.  IMPRESSION: Soft tissue edema without associated osseous abnormality.   Electronically Signed   By: Jeb Levering M.D.   On: 04/19/2015 01:20   Ct Angio Chest Pe W/cm &/or Wo Cm  04/19/2015   CLINICAL DATA:  Dyspnea and palpitations.  EXAM: CT ANGIOGRAPHY CHEST WITH CONTRAST  TECHNIQUE: Multidetector CT imaging of the chest was performed using the standard protocol during bolus administration of intravenous contrast. Multiplanar CT image reconstructions and MIPs were obtained to evaluate the vascular anatomy.  CONTRAST:  125mL OMNIPAQUE IOHEXOL 350 MG/ML SOLN  COMPARISON:  03/16/2015  FINDINGS: Cardiovascular: There is good opacification of the pulmonary arteries. There is no pulmonary embolism. The thoracic aorta is normal in caliber and intact.  Lungs: There is right upper lobe confluent airspace opacity, new. This is superimposed on the chronic centrilobular and paraseptal emphysematous changes as well as the reticulonodular peribronchovascular opacities which are likely associated with the known sarcoidosis. This could represent infectious infiltrate or aspiration.  Central airways: Patent  Effusions: None  Lymphadenopathy: Mild symmetric hilar adenopathy.  Esophagus: Unremarkable  Upper abdomen: No significant abnormality  Musculoskeletal: No significant  abnormality  Review of the MIP images confirms the above findings.  IMPRESSION: 1. Negative for pulmonary embolism 2. New right upper lobe airspace opacity which could represent infectious infiltrate in car aspiration superimposed on the chronic changes of sarcoidosis and emphysema.   Electronically Signed   By: Andreas Newport M.D.   On: 04/19/2015 02:46    ASSESSMENT / PLAN:  Acute on chronic hypoxemic respiratory failure COPD with acute exacerbation Pulmonary sarcoidosis OSA on BiPAP ?  Pulmonary Edema - Supplemental O2 as needed to maintain SpO2 > 90% - BiPAP QHS - Continue prednisone - Change Bronchodilators to nebulized, add budesonide - Hold home symbicort, spiriva for now while on nebs - Would consider additional diuresis (currenlty on home sipronolactone, toresemide) to keep net negative. - May need titration of home BiPAP or to repeat PSG as outpatient as her weight has fluctuated a good bit recently.   ?HCAP - Continue broad spectrum ABX for now - Follow cultures - Trend PCT, low threshold to narrow or d/c ABX in setting of another normal level   Pulmonary HTN - Continue Macitentan, she will likely need help getting this once outpatient. It is reported approved by her insurance, Issue is with pharmacy  Depression - reports that she is tired of "all this" and has felt like "giving up" - consider psych consult - continue home venlafaxine and alprazolam  Rest per primary  Georgann Housekeeper, AGACNP-BC [facility] Pager [phone] or [phone]  04/19/2015 7:05 PM    Merton Border, MD ; [facility] service Mobile [phone].  After 5:30 PM or weekends, call [phone]

## 2015-04-19 NOTE — ED Notes (Signed)
Patient transported to X-ray 

## 2015-04-19 NOTE — ED Notes (Signed)
Dr Daleen Bo at bedside.

## 2015-04-20 ENCOUNTER — Ambulatory Visit: Payer: Self-pay | Admitting: Internal Medicine

## 2015-04-20 DIAGNOSIS — J9601 Acute respiratory failure with hypoxia: Secondary | ICD-10-CM

## 2015-04-20 LAB — BASIC METABOLIC PANEL
Anion gap: 11 (ref 5–15)
BUN: 17 mg/dL (ref 6–20)
CHLORIDE: 95 mmol/L — AB (ref 101–111)
CO2: 28 mmol/L (ref 22–32)
Calcium: 8.6 mg/dL — ABNORMAL LOW (ref 8.9–10.3)
Creatinine, Ser: 1.09 mg/dL — ABNORMAL HIGH (ref 0.44–1.00)
GFR calc Af Amer: >60 mL/min (ref >60)
GFR, EST NON AFRICAN AMERICAN: 59 mL/min — AB (ref >60)
GLUCOSE: 329 mg/dL — AB (ref 65–99)
Potassium: 3.9 mmol/L (ref 3.5–5.1)
SODIUM: 134 mmol/L — AB (ref 135–145)

## 2015-04-20 LAB — RAPID URINE DRUG SCREEN, HOSP PERFORMED
Amphetamines: NOT DETECTED
BENZODIAZEPINES: POSITIVE — AB
Barbiturates: NOT DETECTED
COCAINE: NOT DETECTED
Opiates: POSITIVE — AB
Tetrahydrocannabinol: NOT DETECTED

## 2015-04-20 LAB — CBC
HCT: 37.4 % (ref 36.0–46.0)
HEMOGLOBIN: 12 g/dL (ref 12.0–15.0)
MCH: 25.2 pg — ABNORMAL LOW (ref 26.0–34.0)
MCHC: 32.1 g/dL (ref 30.0–36.0)
MCV: 78.4 fL (ref 78.0–100.0)
PLATELETS: 171 10*3/uL (ref 150–400)
RBC: 4.77 MIL/uL (ref 3.87–5.11)
RDW: 17.3 % — ABNORMAL HIGH (ref 11.5–15.5)
WBC: 10.5 10*3/uL (ref 4.0–10.5)

## 2015-04-20 LAB — GLUCOSE, CAPILLARY
GLUCOSE-CAPILLARY: 302 mg/dL — AB (ref 65–99)
Glucose-Capillary: 376 mg/dL — ABNORMAL HIGH (ref 65–99)
Glucose-Capillary: 344 mg/dL — ABNORMAL HIGH (ref 65–99)
Glucose-Capillary: 383 mg/dL — ABNORMAL HIGH (ref 65–99)

## 2015-04-20 MED ORDER — SALINE SPRAY 0.65 % NA SOLN
1.0000 | NASAL | Status: DC | PRN
Start: 2015-04-20 — End: 2015-04-23
  Filled 2015-04-20: qty 44

## 2015-04-20 MED ORDER — NICOTINE 14 MG/24HR TD PT24
14.0000 mg | MEDICATED_PATCH | Freq: Every day | TRANSDERMAL | Status: DC
  Administered 2015-04-20: 14 mg via TRANSDERMAL
  Filled 2015-04-20 (×4): qty 1

## 2015-04-20 MED ORDER — SALINE SPRAY 0.65 % NA SOLN: 1.0000 | NASAL | Status: DC | PRN

## 2015-04-20 MED ORDER — INSULIN GLARGINE 100 UNIT/ML ~~LOC~~ SOLN
60.0000 [IU] | Freq: Two times a day (BID) | SUBCUTANEOUS | Status: DC
  Administered 2015-04-20 (×2): 60 [IU] via SUBCUTANEOUS
  Filled 2015-04-20 (×3): qty 0.6

## 2015-04-20 MED ORDER — FUROSEMIDE 10 MG/ML IJ SOLN
80.0000 mg | Freq: Four times a day (QID) | INTRAMUSCULAR | Status: AC
  Administered 2015-04-20 (×2): 80 mg via INTRAVENOUS
  Filled 2015-04-20 (×2): qty 8

## 2015-04-20 MED ORDER — INSULIN ASPART 100 UNIT/ML ~~LOC~~ SOLN
6.0000 [IU] | Freq: Three times a day (TID) | SUBCUTANEOUS | Status: DC
  Administered 2015-04-20 – 2015-04-22 (×7): 6 [IU] via SUBCUTANEOUS

## 2015-04-20 MED ORDER — GUAIFENESIN-CODEINE 100-10 MG/5ML PO SOLN
5.0000 mL | ORAL | Status: DC | PRN
  Administered 2015-04-20 – 2015-04-23 (×12): 5 mL via ORAL
  Filled 2015-04-20 (×12): qty 5

## 2015-04-20 NOTE — Progress Notes (Signed)
Patient set up and placed on Bipap w/ full face mask per patient she wears that at home.  Adjusted setting to best fit patients needs and what she wears at home. Currently on 13.5 CMH20 w/ 4 lpm bled in.  Pt tolerating well at this time will continue to monitor.

## 2015-04-20 NOTE — Care Management Note (Addendum)
Case Management Note  Patient Details  Name: NAVY ROTHSCHILD MRN: [id number] Date of Birth: 10/22/1965  Subjective/Objective:   Pt admitted for Acute Respiratory Failure. Pt is currently active with [facility]. Pt has DME 02 at home.   Action/Plan: Benefits check in process for medication Macitentan. CM will make pt aware of cost once completed. CM will continue to monitor for additional needs.    Expected Discharge Date:                  Expected Discharge Plan:  [place]  In-House Referral:  NA  Discharge planning Services  CM Consult  Post Acute Care Choice:  Home Health, Resumption of Svcs/PTA Provider Choice offered to:  Patient  DME Arranged:    DME Agency:     HH Arranged:   Registered Nurse and Physical Therapy [place] Agency:   [facility].   Status of Service:  In process, will continue to follow  Medicare Important Message Given:    Date Medicare IM Given:    Medicare IM give by:    Date Additional Medicare IM Given:    Additional Medicare Important Message give by:     If discussed at [place] of Stay Meetings, dates discussed:    Additional Comments: [place] [phone] Jacqlyn Krauss, RN,BSN [phone] Physician spoke to [facility] in regards to psych consulting. CM did call Raquel Sarna covering for Psych. She stated Dr. Lenna Sciara will be able to consult with pt today. Physician stated that pt will need outpatient Psych f/u. CM did relay message to [place]. Per Raquel Sarna if recommendations made by Dr. Lenna Sciara for outpatient f/u. Raquel Sarna will set up. No further needs from CM at this time.   1239 04-21-15 Jacqlyn Krauss, RN,BSN [phone] Benefits check completed for Macitentan: Patient will have a $3.67 co-payment. Patient can obtain meds through [facility]. CM did call the Heart Failure Liaison [place] and the [facility] is working with pt in regards to medications due to mail order pharmacy. Pt will need resumption orders for [facility]  services listed above and  F2F. [facility] Liaison aware of services. SOC to begin within 24-48 hrs post d/c. [facility] to f/u once in the community as well.  No further needs from CM at this time.    Bethena Roys, RN 04/20/2015, 2:16 PM

## 2015-04-20 NOTE — Progress Notes (Signed)
[facility] PROGRESS NOTE  [name] XFG:[id number] DOB: 10-11-1965 DOA: 04/18/2015 PCP: Yvette Grant, MD  Assessment/Plan: Active Problems:   PULMONARY SARCOIDOSIS   Obstructive sleep apnea   Diabetes type 2, uncontrolled   Chronic diastolic CHF (congestive heart failure)   COPD (chronic obstructive pulmonary disease) with emphysema   Pulmonary HTN   Acute on chronic respiratory failure with hypoxia   Acute respiratory failure   Sepsis     1. Acute respiratory failure with hypoxia - could be multifactorial but since patient's CT angio shows infiltrates patient has been placed on Zosyn and vancomycin for healthcare associated pneumonia with vancomycin and Zosyn. Patient has been evaluated by pulmonology. Advised to continue with steroids and empiric antibiotics, follow cultures.. Patient also has history of severe pulmonary hypertension currently being treated with Macitentan. Continue when necessary nebulizers. BiPAP daily at bedtime. CT negative for PE, venous Doppler negative. 2. Probable SIRS  - source could be pneumonia and also left ankle swelling and UTI. Follow urine cultures. Patient suspected to have gout. Uric acid elevated at 8.8. Patient has been placed on prednisone for possible gout flair.  procalcitonin negative and lactic acid is improving. Patient's diuretics may have to be held if lactic acid is elevated of 2.17 upon admission. 3. Diabetes mellitus type 2 uncontrolled - increase Lantus, diabetes coordinator falling, start patient on NovoLog 6 units 3 times a day before each meal. 4. Left ankle and left great toe swelling and pain -elevated uric acid levels , continue prednisone for gout flair 5. History of severe pulmonary hypertension and diastolic CHF - continue Demadex and Aldactone 6. Chronic kidney disease stage III - creatinine appears to be at baseline. 7. COPD - presently not wheezing. Continue steroids, pulmonary following, Closely observe.  Continue inhalers. 8. History of only sarcoidosis. 9.  Depression continue  home venlafaxine and alprazolam   Code Status:      Code Status Orders        Start     Ordered   04/19/15 0929  Full code   Continuous     04/19/15 0928     Family Communication: family updated about patient's clinical progress Disposition Plan:  Continue telemetry  Brief narrative: 49 year old female with complicated medical history as below, which includes Pulmonary sarcoidosis, OSA (on nocturnal BiPAP), COPD on home O2 3-4L (all followed by Dr. Elsworth Soho), She is followed by cardiology for severe pulmonary HTN and CHF, DM, and cocaine abuse. She has had several recent hospitalizations, most recently, she was admitted from 7/8 - 7/17 for an exacerbation of her COPD. She presented for that admission complaining of SOB/wheezing. She was treated for CODD exacerbation and acute on chronic heart failure with steroids, nebulized bronchodilators, antibiotics, and diuresis, and improved to the degree that she was discharged to home 7/17. She had a RHC during that hospitalization which demonstrated mildly elevated L heart filling pressure and moderately elevated R heart filling pressure, severe PAH with elevated PVR. Medication changes at discharge included increasing dose of demadex, and starting Macitenan. She was seen in pulmonary office by TP 7/22 at which time she reported feeling slightly better, but she also has not been able to obtain her [place] from pharmacy as they did not have it. She did not appear fluid overloaded at that time. Since that time she reports that she was still not back to baseline, and then Sunday 7/24 she had a very difficult day. She felt much more SOB,  especially with ambulation. She developed chest pain that was all over her chest and was worse with cough and deep breathing. Cough was intermittent and non-productive. Her dry weight is 284 and she states that her scale read 189 on the  day of admission. She denies wheeze. Of note she finished her course of steroids about one week ago. She was ambulating on her typical 4L O2 and became very SOB with dizziness. She also had palpitations that she could feel throughout her entire chest and this was very alarming to her. She called her sister, who came to her house, and felt that [name] was too short of breath to be taken to the hospital by personal car, and EMS was called. Upon their arrival she was tachycardic in the 140s and hypoxemic to 85% on her home 4L. She presented to ED with cc SOB, palpitations, and L ankle pain. CXR of her chest shwoed no acute process, CTA was ordered and showed no PE, but did show a questionable RUL opacification. She was given steroids, nebs, and was started on HCAP coverage. Reports most relief came after Solu-medrol IV push. She was admitted to the hospitalist team. 7/25 she requested Pulmonary consultation as she is typically followed by Dr. Elsworth Soho. Says she has had feelings of being "done with all of this", "ready to give up at times".   Consultants:  Pulmonary    Procedures:  None  Antibiotics: Anti-infectives    Start     Dose/Rate Route Frequency Ordered Stop   04/19/15 1600  vancomycin (VANCOCIN) IVPB 1000 mg/200 mL premix     1,000 mg 200 mL/hr over 60 Minutes Intravenous Every 12 hours 04/19/15 0322     04/19/15 0330  vancomycin (VANCOCIN) 1,500 mg in sodium chloride 0.9 % 500 mL IVPB     1,500 mg 250 mL/hr over 120 Minutes Intravenous  Once 04/19/15 0322 04/19/15 0822   04/19/15 0330  piperacillin-tazobactam (ZOSYN) IVPB 3.375 g     3.375 g 12.5 mL/hr over 240 Minutes Intravenous 3 times per day 04/19/15 0322           HPI/Subjective: Still very sob, has a cough, nose is congested   Objective: Filed Vitals:   04/19/15 2037 04/19/15 2100 04/20/15 0550 04/20/15 0842  BP: 86/71 137/77 121/59   Pulse: 83  73   Temp: 98.8 F (37.1 C)  97.9 F (36.6 C)   TempSrc: Oral  Oral    Resp: 20  18   Height:      Weight:   131.997 kg (291 lb)   SpO2: 100%  100% 98%    Intake/Output Summary (Last 24 hours) at 04/20/15 0941 Last data filed at 04/20/15 0500  Gross per 24 hour  Intake   1040 ml  Output   1400 ml  Net   -360 ml    Exam:  General: No acute respiratory distress Lungs: Clear to auscultation bilaterally without wheezes or crackles Cardiovascular: Regular rate and rhythm without murmur gallop or rub normal S1 and S2 Abdomen: Nontender, nondistended, soft, bowel sounds positive, no rebound, no ascites, no appreciable mass Extremities: No significant cyanosis, clubbing, or edema bilateral lower extremities     Data Review   Micro Results No results found for this or any previous visit (from the past 240 hour(s)).  Radiology Reports Dg Chest 2 View  04/18/2015   CLINICAL DATA:  Shortness of breath.  History of sarcoidosis  EXAM: CHEST  2 VIEW  COMPARISON:  April 12, 2015  and March 15, 2015 chest radiograph; chest CT March 16, 2015  FINDINGS: There is stable scarring/ fibrotic type change in the lung bases, more severe on the right than on the left. There is also mild scarring in the medial left upper lobe. There is no frank edema or consolidation. The heart is upper normal in size with pulmonary vascularity within normal limits. No adenopathy apparent. No bone lesions.  IMPRESSION: Areas of scarring, most pronounced in the right lower lobe, stable. No new opacity. No change in cardiac silhouette.   Electronically Signed   By: Lowella Grip III M.D.   On: 04/18/2015 21:53   Dg Chest 2 View  04/02/2015   CLINICAL DATA:  Patient with shortness of breath. History of pulmonary hypertension.  EXAM: CHEST  2 VIEW  COMPARISON:  Chest radiograph 03/22/2015  FINDINGS: Monitoring leads overlie the patient. Stable cardiac and mediastinal contours. Unchanged bilateral coarse interstitial opacities predominately involving the lower lungs bilaterally. No superimposed  consolidative opacity. No pleural effusion or pneumothorax. Regional skeleton is unremarkable.  IMPRESSION: No acute cardiopulmonary process.  Unchanged basilar scarring compatible with sarcoidosis.   Electronically Signed   By: Lovey Newcomer M.D.   On: 04/02/2015 19:14   Dg Chest 2 View  03/22/2015   CLINICAL DATA:  Left-sided chest pain. Hypertension. History of sarcoidosis.  EXAM: CHEST  2 VIEW  COMPARISON:  CT chest 03/16/2015. PA and lateral chest 10/18/2014. Single view of the chest 03/15/2015.  FINDINGS: The lungs are emphysematous with unchanged patchy basilar opacities likely due to scar or atelectasis. No consolidative process, pneumothorax or effusion is identified. Heart size is enlarged.  IMPRESSION: Cardiomegaly without acute disease.  Emphysema. Basilar scarring compatible sarcoidosis appears unchanged.   Electronically Signed   By: Inge Rise M.D.   On: 03/22/2015 18:55   Dg Ankle 2 Views Left  04/19/2015   CLINICAL DATA:  Left ankle pain and swelling.  No known trauma.  EXAM: LEFT ANKLE - 2 VIEW  COMPARISON:  None.  FINDINGS: No fracture or dislocation. The alignment and joint spaces are maintained. The ankle mortise is preserved. No erosion or periosteal reaction. Mild soft tissue edema is noted. No radiopaque foreign body.  IMPRESSION: Soft tissue edema without associated osseous abnormality.   Electronically Signed   By: Jeb Levering M.D.   On: 04/19/2015 01:20   Ct Angio Chest Pe W/cm &/or Wo Cm  04/19/2015   CLINICAL DATA:  Dyspnea and palpitations.  EXAM: CT ANGIOGRAPHY CHEST WITH CONTRAST  TECHNIQUE: Multidetector CT imaging of the chest was performed using the standard protocol during bolus administration of intravenous contrast. Multiplanar CT image reconstructions and MIPs were obtained to evaluate the vascular anatomy.  CONTRAST:  116mL OMNIPAQUE IOHEXOL 350 MG/ML SOLN  COMPARISON:  03/16/2015  FINDINGS: Cardiovascular: There is good opacification of the pulmonary  arteries. There is no pulmonary embolism. The thoracic aorta is normal in caliber and intact.  Lungs: There is right upper lobe confluent airspace opacity, new. This is superimposed on the chronic centrilobular and paraseptal emphysematous changes as well as the reticulonodular peribronchovascular opacities which are likely associated with the known sarcoidosis. This could represent infectious infiltrate or aspiration.  Central airways: Patent  Effusions: None  Lymphadenopathy: Mild symmetric hilar adenopathy.  Esophagus: Unremarkable  Upper abdomen: No significant abnormality  Musculoskeletal: No significant abnormality  Review of the MIP images confirms the above findings.  IMPRESSION: 1. Negative for pulmonary embolism 2. New right upper lobe airspace opacity which could represent infectious  infiltrate in car aspiration superimposed on the chronic changes of sarcoidosis and emphysema.   Electronically Signed   By: Andreas Newport M.D.   On: 04/19/2015 02:46     CBC  Recent Labs Lab 04/18/15 2135 04/19/15 0728 04/20/15 0438  WBC 8.6 8.1 10.5  HGB 13.2 12.9 12.0  HCT 40.2 39.0 37.4  PLT 179 151 171  MCV 77.5* 77.5* 78.4  MCH 25.4* 25.6* 25.2*  MCHC 32.8 33.1 32.1  RDW 17.1* 17.2* 17.3*  LYMPHSABS 1.1 0.5*  --   MONOABS 0.8 0.1  --   EOSABS 0.1 0.0  --   BASOSABS 0.0 0.0  --     Chemistries   Recent Labs Lab 04/18/15 2135 04/19/15 0144 04/19/15 0728 04/20/15 0438  NA 127*  --  129* 134*  K 3.4*  --  4.3 3.9  CL 88*  --  94* 95*  CO2 27  --  24 28  GLUCOSE 228*  --  404* 329*  BUN 9  --  12 17  CREATININE 1.17*  --  1.08* 1.09*  CALCIUM 8.4*  --  8.3* 8.6*  MG  --  1.5*  --   --   AST 34  --  28  --   ALT 60*  --  54  --   ALKPHOS 96  --  93  --   BILITOT 1.2  --  1.6*  --    ------------------------------------------------------------------------------------------------------------------ estimated creatinine clearance is 90.8 mL/min (by C-G formula based on Cr of  1.09). ------------------------------------------------------------------------------------------------------------------ No results for input(s): HGBA1C in the last 72 hours. ------------------------------------------------------------------------------------------------------------------ No results for input(s): CHOL, HDL, LDLCALC, TRIG, CHOLHDL, LDLDIRECT in the last 72 hours. ------------------------------------------------------------------------------------------------------------------  Recent Labs  04/19/15 0920  TSH 0.429   ------------------------------------------------------------------------------------------------------------------ No results for input(s): VITAMINB12, FOLATE, FERRITIN, TIBC, IRON, RETICCTPCT in the last 72 hours.  Coagulation profile  Recent Labs Lab 04/19/15 0728  INR 1.14    No results for input(s): DDIMER in the last 72 hours.  Cardiac Enzymes No results for input(s): CKMB, TROPONINI, MYOGLOBIN in the last 168 hours.  Invalid input(s): CK ------------------------------------------------------------------------------------------------------------------ Invalid input(s): POCBNP   CBG:  Recent Labs Lab 04/19/15 0953 04/19/15 1221 04/19/15 1643 04/19/15 2108 04/20/15 0730  GLUCAP 469* 405* 413* 312* 302*       Studies: Dg Chest 2 View  04/18/2015   CLINICAL DATA:  Shortness of breath.  History of sarcoidosis  EXAM: CHEST  2 VIEW  COMPARISON:  April 12, 2015 and March 15, 2015 chest radiograph; chest CT March 16, 2015  FINDINGS: There is stable scarring/ fibrotic type change in the lung bases, more severe on the right than on the left. There is also mild scarring in the medial left upper lobe. There is no frank edema or consolidation. The heart is upper normal in size with pulmonary vascularity within normal limits. No adenopathy apparent. No bone lesions.  IMPRESSION: Areas of scarring, most pronounced in the right lower lobe, stable. No  new opacity. No change in cardiac silhouette.   Electronically Signed   By: Lowella Grip III M.D.   On: 04/18/2015 21:53   Dg Ankle 2 Views Left  04/19/2015   CLINICAL DATA:  Left ankle pain and swelling.  No known trauma.  EXAM: LEFT ANKLE - 2 VIEW  COMPARISON:  None.  FINDINGS: No fracture or dislocation. The alignment and joint spaces are maintained. The ankle mortise is preserved. No erosion or periosteal reaction. Mild soft tissue edema is noted. No  radiopaque foreign body.  IMPRESSION: Soft tissue edema without associated osseous abnormality.   Electronically Signed   By: Jeb Levering M.D.   On: 04/19/2015 01:20   Ct Angio Chest Pe W/cm &/or Wo Cm  04/19/2015   CLINICAL DATA:  Dyspnea and palpitations.  EXAM: CT ANGIOGRAPHY CHEST WITH CONTRAST  TECHNIQUE: Multidetector CT imaging of the chest was performed using the standard protocol during bolus administration of intravenous contrast. Multiplanar CT image reconstructions and MIPs were obtained to evaluate the vascular anatomy.  CONTRAST:  158mL OMNIPAQUE IOHEXOL 350 MG/ML SOLN  COMPARISON:  03/16/2015  FINDINGS: Cardiovascular: There is good opacification of the pulmonary arteries. There is no pulmonary embolism. The thoracic aorta is normal in caliber and intact.  Lungs: There is right upper lobe confluent airspace opacity, new. This is superimposed on the chronic centrilobular and paraseptal emphysematous changes as well as the reticulonodular peribronchovascular opacities which are likely associated with the known sarcoidosis. This could represent infectious infiltrate or aspiration.  Central airways: Patent  Effusions: None  Lymphadenopathy: Mild symmetric hilar adenopathy.  Esophagus: Unremarkable  Upper abdomen: No significant abnormality  Musculoskeletal: No significant abnormality  Review of the MIP images confirms the above findings.  IMPRESSION: 1. Negative for pulmonary embolism 2. New right upper lobe airspace opacity which could  represent infectious infiltrate in car aspiration superimposed on the chronic changes of sarcoidosis and emphysema.   Electronically Signed   By: Andreas Newport M.D.   On: 04/19/2015 02:46      Lab Results  Component Value Date   HGBA1C 9.3* 04/04/2015   HGBA1C 9.4* 12/19/2014   HGBA1C 8.0* 10/19/2014   Lab Results  Component Value Date   MICROALBUR 0.4 04/06/2014   LDLCALC 178* 04/06/2014   CREATININE 1.09* 04/20/2015       Scheduled Meds: . ALPRAZolam  1 mg Oral TID  . aspirin EC  81 mg Oral Daily  . budesonide  0.25 mg Nebulization 4 times per day  . cetirizine HCl  10 mg Oral QHS  . diclofenac sodium  4 g Topical QID  . fluticasone  2 spray Each Nare Daily  . glimepiride  1 mg Oral Q breakfast  . insulin aspart  0-15 Units Subcutaneous TID WC  . insulin aspart  0-5 Units Subcutaneous QHS  . insulin aspart  6 Units Subcutaneous TID WC  . insulin glargine  60 Units Subcutaneous BID  . ipratropium-albuterol  3 mL Nebulization Q6H  . Macitentan  10 mg Oral Daily  . omega-3 acid ethyl esters  1 g Oral Daily  . pantoprazole  40 mg Oral Daily  . piperacillin-tazobactam (ZOSYN)  IV  3.375 g Intravenous 3 times per day  . potassium chloride SA  40 mEq Oral BID  . predniSONE  40 mg Oral Q breakfast  . sodium chloride  3 mL Intravenous Q12H  . spironolactone  25 mg Oral Daily  . torsemide  60 mg Oral BID  . vancomycin  1,000 mg Intravenous Q12H  . venlafaxine XR  37.5 mg Oral Q breakfast   Continuous Infusions:   Active Problems:   PULMONARY SARCOIDOSIS   Obstructive sleep apnea   Diabetes type 2, uncontrolled   Chronic diastolic CHF (congestive heart failure)   COPD (chronic obstructive pulmonary disease) with emphysema   Pulmonary HTN   Acute on chronic respiratory failure with hypoxia   Acute respiratory failure   Sepsis    Time spent: 45 minutes   [facility] Pager [phone].  If 7PM-7AM, please contact night-coverage at  [email or web address], password [email or web address] 04/20/2015, 9:41 AM  LOS: 1 day

## 2015-04-20 NOTE — Progress Notes (Signed)
Pt places self on CPAP. Instructed to call if help is needed.

## 2015-04-20 NOTE — Progress Notes (Signed)
Reviewed & agree with plan  

## 2015-04-20 NOTE — Progress Notes (Signed)
Inpatient Diabetes Program Recommendations  AACE/ADA: New Consensus Statement on Inpatient Glycemic Control (2013)  Target Ranges:  Prepandial:   less than 140 mg/dL      Peak postprandial:   less than 180 mg/dL (1-2 hours)      Critically ill patients:  140 - 180 mg/dL   Results for [name], [name] (MRN [id number]) as of 04/20/2015 07:42  Ref. Range 04/19/2015 08:33 04/19/2015 09:53 04/19/2015 12:21 04/19/2015 16:43 04/19/2015 21:08  Glucose-Capillary Latest Ref Range: 65-99 mg/dL 475 (H) 469 (H) 405 (H) 413 (H) 312 (H)    Reason for assessment: elevated CBG  Diabetes history: Type 2 Outpatient Diabetes medications: Amaryl 1 mg/day, Novolog 6-10 units tid with meals, Lantus 55 units bid Current orders for Inpatient glycemic control: Amaryl 1 mg/day, Novolog 0-15 tid with meals, Novolog 0-5 units qhs, Lantus 55 units bid  Please consider increasing the Novolog correction to resistant scale 0-20 unitstid since the patient is on steroids.    Consider adding Novolog 6 units tid for meal coverage in addition to the Novolog correction.  Gentry Fitz, RN, BA, MHA, CDE Diabetes Coordinator Inpatient Diabetes Program  [phone] (Team Pager) [phone] ([place]) 04/20/2015 7:47 AM

## 2015-04-20 NOTE — Progress Notes (Signed)
UR Completed Jadden Yim Graves-Bigelow, RN,BSN [phone]  

## 2015-04-20 NOTE — Consult Note (Addendum)
   [facility] CM Inpatient Consult   04/20/2015  [name] [birth year] [id number]   Patient active with [facility]. Came to bedside to speak with patient who is currently on bipap . Therefore, she spoke very little. She nodded yes in response to whether she wanted [facility] to continue to follow post discharge. Spoke with [facility] RNCM about patient. Made inpatient RNCM aware that [facility] is following. Please see chart review tab and in notes in EPIC for [facility] patient community outreach notes. Will continue to follow.  Yvette Rolling, MSN-Ed, RN,BSN [facility] Liaison [phone]

## 2015-04-20 NOTE — Progress Notes (Signed)
   Name: [name] MRN: [id number] DOB: Feb 24, 1966    ADMISSION DATE:  04/18/2015 CONSULTATION DATE:  04/19/2015  REFERRING MD :  Dr. Daleen Bo  CHIEF COMPLAINT:  SOB  BRIEF PATIENT DESCRIPTION: 49 year old female with pulmonary sarcoidosis and pulmonary HTN (RA pt) admitted to [facility] 7/24 for respiratory failure secondary to presumed HCAP/sepsis. PCCM to consult.  SIGNIFICANT EVENTS    STUDIES:  6/22 RHC> RA mean 16, RV 73/22, PA 70/32, mean 47, PCWP mean 20. Oxygen saturations:PA 69% AO 96% Cardiac Output (Fick) 6.56 Cardiac Index (Fick) 2.74, PVR 4.1 WU, Cardiac Output (Thermo) 4.95, Cardiac Index (Thermo) 2.07, PVR 5.5 WU CTA chest 7/25 > Negative for PE, new RUL opacification LE Doppler 7/25 >>>   SUBJECTIVE: Breathing a little better today  VITAL SIGNS: Temp:  [97.7 F (36.5 C)-98.8 F (37.1 C)] 97.9 F (36.6 C) (07/26 0550) Pulse Rate:  [73-98] 73 (07/26 0550) Resp:  [18-24] 18 (07/26 0550) BP: (86-137)/(58-80) 121/59 mmHg (07/26 0550) SpO2:  [93 %-100 %] 98 % (07/26 0842) Weight:  [131.498 kg (289 lb 14.4 oz)-131.997 kg (291 lb)] 131.997 kg (291 lb) (07/26 0550)  PHYSICAL EXAMINATION: General:  No distress HENT: NCAT EOMi PULM: Good air movement no wheezing CV: RRR, no mgr MSK: mild edema, normal bulk/tone GI: BS+, soft Derm: no rash   Recent Labs Lab 04/18/15 2135 04/19/15 0728 04/20/15 0438  NA 127* 129* 134*  K 3.4* 4.3 3.9  CL 88* 94* 95*  CO2 27 24 28   BUN 9 12 17   CREATININE 1.17* 1.08* 1.09*  GLUCOSE 228* 404* 329*    Recent Labs Lab 04/18/15 2135 04/19/15 0728 04/20/15 0438  HGB 13.2 12.9 12.0  HCT 40.2 39.0 37.4  WBC 8.6 8.1 10.5  PLT 179 151 171   CTAngio chest images reviewed> chronic cysts and interlobular septal thickening noted, increase ggo in RUL, no PE, some ggo bases  ASSESSMENT / PLAN:  Acute on chronic hypoxemic respiratory failure > presumably pulm edema as weight up 10+ pounds at home COPD with acute  exacerbation Pulmonary sarcoidosis OSA on BiPAP  - Supplemental O2 as needed to maintain SpO2 > 90% - BiPAP QHS - Continue prednisone 40mg  daily, will taper - continue Bronchodilators nebulized, add budesonide - Will diuresis with lasix today - repeat CXR in AM - May need titration of home BiPAP or to repeat PSG as outpatient as her weight has fluctuated a good bit recently.  - check UDS given cocaine use  Doubt HCAP as no mucus production, no clear consolidation - Will likely hold broad spectrum ABX tomorrow if afebrile - Follow cultures  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 It is reported approved by her insurance, Issue is with pharmacy - diurese with lasix   Rest per primary  Roselie Awkward, MD [facility] Pager: [phone] Cell: [phone] After 3pm or if no response, call [phone]   04/20/2015 1:11 PM

## 2015-04-21 ENCOUNTER — Inpatient Hospital Stay (HOSPITAL_COMMUNITY): Payer: Medicare Other

## 2015-04-21 ENCOUNTER — Encounter: Payer: Self-pay | Admitting: Pharmacist

## 2015-04-21 DIAGNOSIS — E1165 Type 2 diabetes mellitus with hyperglycemia: Secondary | ICD-10-CM

## 2015-04-21 DIAGNOSIS — I5033 Acute on chronic diastolic (congestive) heart failure: Secondary | ICD-10-CM | POA: Diagnosis present

## 2015-04-21 DIAGNOSIS — N390 Urinary tract infection, site not specified: Secondary | ICD-10-CM

## 2015-04-21 DIAGNOSIS — B9620 Unspecified Escherichia coli [E. coli] as the cause of diseases classified elsewhere: Secondary | ICD-10-CM | POA: Diagnosis present

## 2015-04-21 LAB — COMPREHENSIVE METABOLIC PANEL
ALBUMIN: 3.1 g/dL — AB (ref 3.5–5.0)
ALK PHOS: 83 U/L (ref 38–126)
ALT: 47 U/L (ref 14–54)
AST: 21 U/L (ref 15–41)
Anion gap: 10 (ref 5–15)
BILIRUBIN TOTAL: 0.9 mg/dL (ref 0.3–1.2)
BUN: 17 mg/dL (ref 6–20)
CO2: 30 mmol/L (ref 22–32)
CREATININE: 1.02 mg/dL — AB (ref 0.44–1.00)
Calcium: 8.4 mg/dL — ABNORMAL LOW (ref 8.9–10.3)
Chloride: 93 mmol/L — ABNORMAL LOW (ref 101–111)
GFR calc Af Amer: >60 mL/min (ref >60)
GFR calc non Af Amer: >60 mL/min (ref >60)
Glucose, Bld: 289 mg/dL — ABNORMAL HIGH (ref 65–99)
POTASSIUM: 3.8 mmol/L (ref 3.5–5.1)
Sodium: 133 mmol/L — ABNORMAL LOW (ref 135–145)
TOTAL PROTEIN: 6.8 g/dL (ref 6.5–8.1)

## 2015-04-21 LAB — CBC
HEMATOCRIT: 37.5 % (ref 36.0–46.0)
Hemoglobin: 11.9 g/dL — ABNORMAL LOW (ref 12.0–15.0)
MCH: 25.1 pg — ABNORMAL LOW (ref 26.0–34.0)
MCHC: 31.7 g/dL (ref 30.0–36.0)
MCV: 78.9 fL (ref 78.0–100.0)
Platelets: 177 10*3/uL (ref 150–400)
RBC: 4.75 MIL/uL (ref 3.87–5.11)
RDW: 17.3 % — AB (ref 11.5–15.5)
WBC: 8.5 10*3/uL (ref 4.0–10.5)

## 2015-04-21 LAB — URINE CULTURE

## 2015-04-21 LAB — GLUCOSE, CAPILLARY
GLUCOSE-CAPILLARY: 415 mg/dL — AB (ref 65–99)
Glucose-Capillary: 205 mg/dL — ABNORMAL HIGH (ref 65–99)
Glucose-Capillary: 239 mg/dL — ABNORMAL HIGH (ref 65–99)
Glucose-Capillary: 543 mg/dL — ABNORMAL HIGH (ref 65–99)
Glucose-Capillary: 441 mg/dL — ABNORMAL HIGH (ref 65–99)

## 2015-04-21 MED ORDER — VARENICLINE TARTRATE 0.5 MG PO TABS
0.5000 mg | ORAL_TABLET | Freq: Two times a day (BID) | ORAL | Status: DC
  Filled 2015-04-21 (×2): qty 1

## 2015-04-21 MED ORDER — INSULIN ASPART 100 UNIT/ML ~~LOC~~ SOLN
20.0000 [IU] | Freq: Once | SUBCUTANEOUS | Status: AC
  Administered 2015-04-21: 20 [IU] via SUBCUTANEOUS

## 2015-04-21 MED ORDER — VARENICLINE TARTRATE 0.5 MG PO TABS
0.5000 mg | ORAL_TABLET | Freq: Two times a day (BID) | ORAL | Status: DC
  Administered 2015-04-22 – 2015-04-23 (×4): 0.5 mg via ORAL
  Filled 2015-04-21 (×7): qty 1

## 2015-04-21 MED ORDER — INSULIN ASPART 100 UNIT/ML ~~LOC~~ SOLN
0.0000 [IU] | Freq: Three times a day (TID) | SUBCUTANEOUS | Status: DC
  Administered 2015-04-21: 20 [IU] via SUBCUTANEOUS
  Administered 2015-04-22: 7 [IU] via SUBCUTANEOUS
  Administered 2015-04-22: 4 [IU] via SUBCUTANEOUS
  Administered 2015-04-23: 20 [IU] via SUBCUTANEOUS
  Administered 2015-04-23: 7 [IU] via SUBCUTANEOUS
  Administered 2015-04-23: 15 [IU] via SUBCUTANEOUS

## 2015-04-21 MED ORDER — INSULIN ASPART 100 UNIT/ML ~~LOC~~ SOLN
6.0000 [IU] | Freq: Once | SUBCUTANEOUS | Status: AC
  Administered 2015-04-21: 6 [IU] via SUBCUTANEOUS

## 2015-04-21 MED ORDER — INSULIN ASPART 100 UNIT/ML ~~LOC~~ SOLN
10.0000 [IU] | Freq: Once | SUBCUTANEOUS | Status: AC
  Administered 2015-04-21: 10 [IU] via SUBCUTANEOUS

## 2015-04-21 MED ORDER — PREDNISONE 20 MG PO TABS
20.0000 mg | ORAL_TABLET | Freq: Every day | ORAL | Status: DC
  Administered 2015-04-22 – 2015-04-23 (×2): 20 mg via ORAL
  Filled 2015-04-21 (×2): qty 1

## 2015-04-21 MED ORDER — FUROSEMIDE 10 MG/ML IJ SOLN
80.0000 mg | Freq: Two times a day (BID) | INTRAMUSCULAR | Status: AC
  Administered 2015-04-21: 80 mg via INTRAVENOUS
  Filled 2015-04-21: qty 8

## 2015-04-21 MED ORDER — INSULIN GLARGINE 100 UNIT/ML ~~LOC~~ SOLN
64.0000 [IU] | Freq: Two times a day (BID) | SUBCUTANEOUS | Status: DC
  Administered 2015-04-21 (×2): 64 [IU] via SUBCUTANEOUS
  Filled 2015-04-21 (×3): qty 0.64

## 2015-04-21 MED ORDER — INSULIN ASPART 100 UNIT/ML ~~LOC~~ SOLN
0.0000 [IU] | Freq: Every day | SUBCUTANEOUS | Status: DC
  Administered 2015-04-21: 5 [IU] via SUBCUTANEOUS
  Administered 2015-04-22: 3 [IU] via SUBCUTANEOUS

## 2015-04-21 MED ORDER — TORSEMIDE 20 MG PO TABS: 60.0000 mg | ORAL_TABLET | Freq: Two times a day (BID) | ORAL | Status: DC

## 2015-04-21 MED ORDER — OXYCODONE-ACETAMINOPHEN 5-325 MG PO TABS
1.0000 | ORAL_TABLET | ORAL | Status: DC | PRN
  Administered 2015-04-21 – 2015-04-23 (×10): 2 via ORAL
  Filled 2015-04-21 (×10): qty 2

## 2015-04-21 MED ORDER — PIPERACILLIN-TAZOBACTAM 3.375 G IVPB
3.3750 g | Freq: Three times a day (TID) | INTRAVENOUS | Status: AC
  Administered 2015-04-21 – 2015-04-22 (×5): 3.375 g via INTRAVENOUS
  Filled 2015-04-21 (×6): qty 50

## 2015-04-21 MED ORDER — FUROSEMIDE 10 MG/ML IJ SOLN
80.0000 mg | Freq: Two times a day (BID) | INTRAMUSCULAR | Status: DC
  Administered 2015-04-21: 80 mg via INTRAVENOUS
  Filled 2015-04-21: qty 8

## 2015-04-21 MED ORDER — TORSEMIDE 20 MG PO TABS
60.0000 mg | ORAL_TABLET | Freq: Two times a day (BID) | ORAL | Status: DC
  Administered 2015-04-22 – 2015-04-23 (×4): 60 mg via ORAL
  Filled 2015-04-21 (×5): qty 3

## 2015-04-21 NOTE — Progress Notes (Signed)
Ms. Mraz is to follow in the [facility] for Pulmonary HTN.  She was scheduled to be seen for hospital follow-up on 04/19/15--that appt was cancelled as she was readmitted to the hospital.  Information has been faxed by Kevan Rosebush -[facility] Clinic Coordinator to an outpatient specialty pharmacy ([facility]) to provide Ms. Silva with her Macitentan and it has been approved by her insurance on 03/19/15.  [name] will be continuing to handle this prescription as an outpatient and will follow-up with Ms. Houser as appropriate.  Please ensure that she has a new follow-up appt scheduled with the [facility] at the time of her discharge.

## 2015-04-21 NOTE — Care Management Important Message (Signed)
Important Message  Patient Details  Name: [name] MRN: [id number] Date of Birth: October 08, 1965   Medicare Important Message Given:  Yes-second notification given    Pricilla Handler 04/21/2015, 9:58 AM

## 2015-04-21 NOTE — Progress Notes (Signed)
[facility] PROGRESS NOTE  [name] WFU:[id number] DOB: May 04, 1966 DOA: 04/18/2015 PCP: Gwendolyn Grant, MD  Assessment/Plan: #1 acute on chronic hypoxemic respiratory failure likely secondary to pulmonary edema. An acute COPD exacerbation  Patient with clinical improvement however still with shortness of breath on exertion. Patient stated she is up about 10 pounds at home and states her baseline weight is anywhere from 281-284 pounds. Patient currently at 289 pounds. Patient received IV Lasix yesterday and diuresed a total of 6.7 L over 24 hour period. Patient with clinical improvement. Continue Lasix 80 mg IV every 12 hours 2 doses and resume home dose Demadex tomorrow. Continue steroids taper per pulmonary. Continue bronchodilators. Pulmonary following.  #2 acute diastolic CHF exacerbation Unknown etiology. Patient denies any chest pain. Patient was placed on Lasix IV yesterday and diuresed -6.7 L over 24 hours. Patient states her usual weight is about 281-284 pounds. Patient's current weight is 289 pounds. Continue IV Lasix 80 mg every 12 hours for 2 more doses and transition to home dose oral Demadex tomorrow.  #3 acute COPD exacerbation Clinically improving. Continue steroids taper, nebulizer treatments. Pulmonary following.  #4 pulmonary hypertension Continue Macitentan.  #5 left ankle and left great toe swelling/possible acute gouty flare Improved on steroids.  #6 chronic kidney disease stage III Stable.  #7 depression Stable. Continue home dose for that lab fax in an alprazolam.  #8 history of sarcoidosis Per pulmonary.  #9 poorly controlled diabetes mellitus type 2 CBGs have ranged from 205-383. Probably a component of steroids. Change sliding scale insulin to resistant scale. Increase Lantus to 64 units twice a day.  #10 Escherichia coli UTI Continue IV Zosyn.  #11 tobacco abuse Resume home dose chantix.  #12 prophylaxis PPI for GI prophylaxis.  Lovenox for DVT prophylaxis.     Code Status: Full Family Communication: Updated patient. No family at bedside. Disposition Plan: Home when acute respiratory distress improved.   Consultants:  PCCM: Georgann Housekeeper, NP 04/19/2015    Procedures:  Chest x-ray 04/21/2015, 04/18/2015  X-rays of the left ankle 04/19/2015  Antibiotics:  IV Zosyn 04/19/2015  IV vancomycin 04/19/2015>>>> 04/21/2059  HPI/Subjective: Patient states improvement with SOB at rest but not on exertion. Patient states ankle pain has improved.  Objective: Filed Vitals:   04/21/15 1500  BP: 134/59  Pulse: 80  Temp: 97.9 F (36.6 C)  Resp:     Intake/Output Summary (Last 24 hours) at 04/21/15 1910 Last data filed at 04/21/15 1739  Gross per 24 hour  Intake   1260 ml  Output   3750 ml  Net  -2490 ml   Filed Weights   04/19/15 1901 04/20/15 0550 04/21/15 0537  Weight: 131.498 kg (289 lb 14.4 oz) 131.997 kg (291 lb) 131.362 kg (289 lb 9.6 oz)    Exam:   General:  NAD  Cardiovascular: RRR  Respiratory: Bibasilar crackles  Abdomen: Soft/NT/ND/+BS  Musculoskeletal: No c/c/e  Data Reviewed: Basic Metabolic Panel:  Recent Labs Lab 04/18/15 2135 04/19/15 0144 04/19/15 0728 04/20/15 0438 04/21/15 0504  NA 127*  --  129* 134* 133*  K 3.4*  --  4.3 3.9 3.8  CL 88*  --  94* 95* 93*  CO2 27  --  24 28 30   GLUCOSE 228*  --  404* 329* 289*  BUN 9  --  12 17 17   CREATININE 1.17*  --  1.08* 1.09* 1.02*  CALCIUM 8.4*  --  8.3* 8.6* 8.4*  MG  --  1.5*  --   --   --  Liver Function Tests:  Recent Labs Lab 04/18/15 2135 04/19/15 0728 04/21/15 0504  AST 34 28 21  ALT 60* 54 47  ALKPHOS 96 93 83  BILITOT 1.2 1.6* 0.9  PROT 7.3 7.2 6.8  ALBUMIN 3.3* 3.3* 3.1*   No results for input(s): LIPASE, AMYLASE in the last 168 hours. No results for input(s): AMMONIA in the last 168 hours. CBC:  Recent Labs Lab 04/18/15 2135 04/19/15 0728 04/20/15 0438 04/21/15 0504  WBC 8.6 8.1  10.5 8.5  NEUTROABS 6.5 7.4  --   --   HGB 13.2 12.9 12.0 11.9*  HCT 40.2 39.0 37.4 37.5  MCV 77.5* 77.5* 78.4 78.9  PLT 179 151 171 177   Cardiac Enzymes: No results for input(s): CKTOTAL, CKMB, CKMBINDEX, TROPONINI in the last 168 hours. BNP (last 3 results)  Recent Labs  12/18/14 1846 03/19/15 0441 04/18/15 2135  BNP 231.0* 61.9 15.1    ProBNP (last 3 results)  Recent Labs  07/20/14 1817  PROBNP 39.7    CBG:  Recent Labs Lab 04/20/15 1629 04/20/15 2056 04/21/15 0805 04/21/15 1116 04/21/15 1620  GLUCAP 344* 383* 205* 239* 415*    Recent Results (from the past 240 hour(s))  Blood Culture (routine x 2)     Status: None (Preliminary result)   Collection Time: 04/19/15  1:42 AM  Result Value Ref Range Status   Specimen Description BLOOD LEFT HAND  Final   Special Requests BOTTLES DRAWN AEROBIC AND ANAEROBIC 5 CC  Final   Culture NO GROWTH 2 DAYS  Final   Report Status PENDING  Incomplete  Blood Culture (routine x 2)     Status: None (Preliminary result)   Collection Time: 04/19/15  1:53 AM  Result Value Ref Range Status   Specimen Description BLOOD RIGHT HAND  Final   Special Requests BOTTLES DRAWN AEROBIC AND ANAEROBIC 5 CC  Final   Culture NO GROWTH 2 DAYS  Final   Report Status PENDING  Incomplete  Urine culture     Status: None   Collection Time: 04/19/15  2:18 AM  Result Value Ref Range Status   Specimen Description URINE, RANDOM  Final   Special Requests NONE  Final   Culture >=100,000 COLONIES/mL ESCHERICHIA COLI  Final   Report Status 04/21/2015 FINAL  Final   Organism ID, Bacteria ESCHERICHIA COLI  Final      Susceptibility   Escherichia coli - MIC*    AMPICILLIN >=32 RESISTANT Resistant     CEFAZOLIN <=4 SENSITIVE Sensitive     CEFTRIAXONE <=1 SENSITIVE Sensitive     CIPROFLOXACIN >=4 RESISTANT Resistant     GENTAMICIN <=1 SENSITIVE Sensitive     IMIPENEM <=0.25 SENSITIVE Sensitive     NITROFURANTOIN <=16 SENSITIVE Sensitive      TRIMETH/SULFA >=320 RESISTANT Resistant     AMPICILLIN/SULBACTAM >=32 RESISTANT Resistant     PIP/TAZO <=4 SENSITIVE Sensitive     * >=100,000 COLONIES/mL ESCHERICHIA COLI     Studies: Dg Chest 2 View  04/21/2015   CLINICAL DATA:  Acute respiratory failure, hypoxia  EXAM: CHEST  2 VIEW  COMPARISON:  CT angio chest of 04/19/2015 and chest x-ray of 04/18/2015  FINDINGS: Basilar linear scarring is unchanged in this patient with history of sarcoidosis. No focal infiltrate or effusion is seen. Mediastinal and hilar contours are unchanged and cardiomegaly is stable. No bony abnormality is seen.  IMPRESSION: Little change in basilar scarring in this patient with sarcoidosis. Stable cardiomegaly.   Electronically Signed   By:  Ivar Drape M.D.   On: 04/21/2015 08:07    Scheduled Meds: . ALPRAZolam  1 mg Oral TID  . aspirin EC  81 mg Oral Daily  . budesonide  0.25 mg Nebulization 4 times per day  . cetirizine HCl  10 mg Oral QHS  . diclofenac sodium  4 g Topical QID  . furosemide  80 mg Intravenous Q12H  . insulin aspart  0-20 Units Subcutaneous TID WC  . insulin aspart  0-5 Units Subcutaneous QHS  . insulin aspart  6 Units Subcutaneous TID WC  . insulin glargine  64 Units Subcutaneous BID  . ipratropium-albuterol  3 mL Nebulization Q6H  . Macitentan  10 mg Oral Daily  . nicotine  14 mg Transdermal Daily  . omega-3 acid ethyl esters  1 g Oral Daily  . pantoprazole  40 mg Oral Daily  . piperacillin-tazobactam (ZOSYN)  IV  3.375 g Intravenous 3 times per day  . potassium chloride SA  40 mEq Oral BID  . [START ON 04/22/2015] predniSONE  20 mg Oral Q breakfast  . sodium chloride  3 mL Intravenous Q12H  . spironolactone  25 mg Oral Daily  . varenicline  0.5 mg Oral BID WC  . venlafaxine XR  37.5 mg Oral Q breakfast   Continuous Infusions:   Principal Problem:   Acute on chronic respiratory failure with hypoxia Active Problems:   Acute on chronic diastolic heart failure   E-coli UTI    PULMONARY SARCOIDOSIS   Obstructive sleep apnea   Diabetes type 2, uncontrolled   Chronic diastolic CHF (congestive heart failure)   COPD (chronic obstructive pulmonary disease) with emphysema   Pulmonary HTN   Acute respiratory failure   Sepsis    Time spent: 64 mins    [facility] MD [facility] Pager [phone]. If 7PM-7AM, please contact night-coverage at [email or web address], password [facility] 04/21/2015, 7:10 PM  LOS: 2 days

## 2015-04-21 NOTE — Progress Notes (Signed)
Name: [name] MRN: [id number] DOB: September 28, 1965    ADMISSION DATE:  04/18/2015 CONSULTATION DATE:  04/19/2015  REFERRING MD :  Dr. Daleen Bo  CHIEF COMPLAINT:  SOB  BRIEF PATIENT DESCRIPTION: 49 year old female with pulmonary sarcoidosis and pulmonary HTN (RA pt) admitted to [facility] 7/24 for respiratory failure secondary to presumed HCAP/sepsis. PCCM to consult.  SIGNIFICANT EVENTS    STUDIES:  6/22 RHC> RA mean 16, RV 73/22, PA 70/32, mean 47, PCWP mean 20. Oxygen saturations:PA 69% AO 96% Cardiac Output (Fick) 6.56 Cardiac Index (Fick) 2.74, PVR 4.1 WU, Cardiac Output (Thermo) 4.95, Cardiac Index (Thermo) 2.07, PVR 5.5 WU CTA chest 7/25 > Negative for PE, new RUL opacification LE Doppler 7/25 >>>neg DVT   SUBJECTIVE: Was a little more SOB this am when she woke up, now improved.  Feels significantly better since admission.  Does feel her bipap isnt as "strong" as it used to be and she has definitely gained weight since her last titration. Denies chest pain, purulent sputum.   VITAL SIGNS: Temp:  [97.9 F (36.6 C)-98.2 F (36.8 C)] 98 F (36.7 C) (07/27 0537) Pulse Rate:  [78-81] 78 (07/27 0537) Resp:  [18-20] 18 (07/27 0537) BP: (115-128)/(65-83) 122/83 mmHg (07/27 0537) SpO2:  [97 %-98 %] 98 % (07/26 2121) Weight:  [289 lb 9.6 oz (131.362 kg)] 289 lb 9.6 oz (131.362 kg) (07/27 0537)  PHYSICAL EXAMINATION: General:  No distress HENT: NCAT EOMi PULM: resps even non labored on 4L [place], Good air movement no wheezing CV: RRR, no mgr MSK: mild edema, normal bulk/tone GI: BS+, soft Derm: no rash   Recent Labs Lab 04/19/15 0728 04/20/15 0438 04/21/15 0504  NA 129* 134* 133*  K 4.3 3.9 3.8  CL 94* 95* 93*  CO2 24 28 30   BUN 12 17 17   CREATININE 1.08* 1.09* 1.02*  GLUCOSE 404* 329* 289*    Recent Labs Lab 04/19/15 0728 04/20/15 0438 04/21/15 0504  HGB 12.9 12.0 11.9*  HCT 39.0 37.4 37.5  WBC 8.1 10.5 8.5  PLT 151 171 177   CTAngio chest 7/25> chronic cysts  and interlobular septal thickening noted, increase ggo in RUL, no PE, some ggo bases  ASSESSMENT / PLAN:  Acute on chronic hypoxemic respiratory failure > presumably pulm edema as weight up 10+ pounds at home COPD with acute exacerbation Pulmonary sarcoidosis OSA on BiPAP  - Supplemental O2 as needed to maintain SpO2 > 90% - chronically 4L - BiPAP QHS - Continue prednisone with taper - 40mg  started 7/26 - continue Bronchodilators nebulized, with budesonide - repeat CXR intermittently  - likely needs titration of home BiPAP or to repeat PSG as outpatient as her weight has fluctuated a good bit recently.     Doubt HCAP as no mucus production, no clear consolidation - d/c vanc 7/27 - will leave pip/tazo for now for Ecoli UTI - Follow cultures  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 It is reported approved by her insurance, Issue is with pharmacy   Rest per primary  Yvette Madrid, NP 04/21/2015  9:57 AM Pager: [phone] or [phone]      Attending:  I have seen and examined the patient with nurse practitioner/resident and agree with the note above.   Feeling better today Lungs with few crackles in bases, good air movement, normal effort  CXR images reviewed> no change in chronic sarcoid changes I/O -  5 L!  Labs reviewed, Cr OK  Acute hypoxemic respiratory failure> I think that this is likely pulmonary edema given 10 pound weight gain.  Doubt sarcoid is primary cause.  Will decrease prednisone.  Agree with IV lasix.  She claims compliance with torsemide at home, so if this is the case we need to adjust her oral dose of torsemide in hospital.  May need higher dose or addition of metolazone.    Roselie Awkward, MD [facility] Pager: [phone] Cell: [phone] After 3pm or if no response, call [phone]

## 2015-04-21 NOTE — Patient Outreach (Signed)
[facility]) Care Management  [place]   04/21/2015  [name] [birth year] [id number]  [name] is a 49 y.o. female who was referred to [facility] for assistance in setting up mail order pharmacy. Patient is currently admitted. Will follow up once patient is discharged.  Nicoletta Ba, PharmD, [facility] [phone]

## 2015-04-22 ENCOUNTER — Telehealth (HOSPITAL_COMMUNITY): Payer: Self-pay | Admitting: Cardiology

## 2015-04-22 DIAGNOSIS — I5033 Acute on chronic diastolic (congestive) heart failure: Principal | ICD-10-CM

## 2015-04-22 DIAGNOSIS — F329 Major depressive disorder, single episode, unspecified: Secondary | ICD-10-CM

## 2015-04-22 LAB — BASIC METABOLIC PANEL
ANION GAP: 13 (ref 5–15)
BUN: 14 mg/dL (ref 6–20)
CALCIUM: 9 mg/dL (ref 8.9–10.3)
CO2: 29 mmol/L (ref 22–32)
Chloride: 91 mmol/L — ABNORMAL LOW (ref 101–111)
Creatinine, Ser: 0.95 mg/dL (ref 0.44–1.00)
GFR calc Af Amer: >60 mL/min (ref >60)
GFR calc non Af Amer: >60 mL/min (ref >60)
GLUCOSE: 230 mg/dL — AB (ref 65–99)
Potassium: 3.8 mmol/L (ref 3.5–5.1)
Sodium: 133 mmol/L — ABNORMAL LOW (ref 135–145)

## 2015-04-22 LAB — CBC
HCT: 39.4 % (ref 36.0–46.0)
Hemoglobin: 12.7 g/dL (ref 12.0–15.0)
MCH: 25.3 pg — AB (ref 26.0–34.0)
MCHC: 32.2 g/dL (ref 30.0–36.0)
MCV: 78.5 fL (ref 78.0–100.0)
PLATELETS: 201 10*3/uL (ref 150–400)
RBC: 5.02 MIL/uL (ref 3.87–5.11)
RDW: 17.2 % — AB (ref 11.5–15.5)
WBC: 10.3 10*3/uL (ref 4.0–10.5)

## 2015-04-22 LAB — GLUCOSE, CAPILLARY
GLUCOSE-CAPILLARY: 287 mg/dL — AB (ref 65–99)
GLUCOSE-CAPILLARY: 165 mg/dL — AB (ref 65–99)
Glucose-Capillary: 236 mg/dL — ABNORMAL HIGH (ref 65–99)
Glucose-Capillary: 441 mg/dL — ABNORMAL HIGH (ref 65–99)
Glucose-Capillary: 484 mg/dL — ABNORMAL HIGH (ref 65–99)
Glucose-Capillary: 264 mg/dL — ABNORMAL HIGH (ref 65–99)

## 2015-04-22 LAB — MAGNESIUM: Magnesium: 2 mg/dL (ref 1.7–2.4)

## 2015-04-22 MED ORDER — INSULIN ASPART 100 UNIT/ML ~~LOC~~ SOLN: 25.0000 [IU] | Freq: Once | SUBCUTANEOUS | Status: DC

## 2015-04-22 MED ORDER — INSULIN ASPART 100 UNIT/ML ~~LOC~~ SOLN
26.0000 [IU] | Freq: Once | SUBCUTANEOUS | Status: AC
  Administered 2015-04-22: 26 [IU] via SUBCUTANEOUS

## 2015-04-22 MED ORDER — IPRATROPIUM-ALBUTEROL 0.5-2.5 (3) MG/3ML IN SOLN
3.0000 mL | Freq: Three times a day (TID) | RESPIRATORY_TRACT | Status: DC
  Administered 2015-04-22 – 2015-04-23 (×4): 3 mL via RESPIRATORY_TRACT
  Filled 2015-04-22 (×4): qty 3

## 2015-04-22 MED ORDER — INSULIN GLARGINE 100 UNIT/ML ~~LOC~~ SOLN
68.0000 [IU] | Freq: Two times a day (BID) | SUBCUTANEOUS | Status: DC
  Administered 2015-04-22 (×2): 68 [IU] via SUBCUTANEOUS
  Filled 2015-04-22 (×3): qty 0.68

## 2015-04-22 MED ORDER — PHENAZOPYRIDINE HCL 200 MG PO TABS
200.0000 mg | ORAL_TABLET | Freq: Three times a day (TID) | ORAL | Status: DC
  Administered 2015-04-22 – 2015-04-23 (×4): 200 mg via ORAL
  Filled 2015-04-22 (×5): qty 1

## 2015-04-22 NOTE — Progress Notes (Signed)
[facility] PROGRESS NOTE  [name] IDP:[id number] DOB: 07-20-1966 DOA: 04/18/2015 PCP: Gwendolyn Grant, MD  Assessment/Plan: #1 acute on chronic hypoxemic respiratory failure likely secondary to pulmonary edema. An acute COPD exacerbation  Patient with clinical improvement however still with shortness of breath on exertion. Patient stated she is up about 10 pounds at home and states her baseline weight is anywhere from 281-284 pounds. Patient currently at 288 pounds. Patient received IV Lasix diuresed a total of -9.67 L throughout the hospitalization and was -3.49 L over the past 24 hours. Change IV Lasix to oral home dose Demadex. Continue steroids taper per pulmonary. Continue bronchodilators. Pulmonary following.   #2 acute diastolic CHF exacerbation Unknown etiology. Patient denies any chest pain. Patient was placed on Lasix IV and diuresed - 9.67 L over the hospitalization and patient was -3.49 L over the past 24 hours. Patient states her usual weight is about 281-284 pounds. Patient's current weight is 288 pounds. Change IV Lasix to oral home dose Demadex. Follow I/O. Daily weights.  #3 acute COPD exacerbation Clinically improving. Continue steroids taper, nebulizer treatments. Pulmonary following.  #4 pulmonary hypertension Continue Macitentan.  #5 left ankle and left great toe swelling/possible acute gouty flare Improved on steroids.  #6 chronic kidney disease stage III Stable.  #7 depression Patient very tearful. Patient feeling distressed. Patient feeling depressed. Continue Effexor and Xanax. Consulted psychiatry for further evaluation and management.   #8 history of sarcoidosis Per pulmonary.  #9 poorly controlled diabetes mellitus type 2 CBGs have ranged from 165-441. Probably a component of steroids. Changed sliding scale insulin to resistant scale. Increased Lantus to 64 units twice a day.  #10 Escherichia coli UTI Continue IV Zosyn and DC after today's  dose.  #11 tobacco abuse Resumed home dose chantix.  #12 prophylaxis PPI for GI prophylaxis. Lovenox for DVT prophylaxis.     Code Status: Full Family Communication: Updated patient. No family at bedside. Disposition Plan: Home when acute respiratory distress improved, hopefully 1-2 days.   Consultants:  PCCM: Georgann Housekeeper, NP 04/19/2015  Orthopedics: Dr. Alvan Dame 04/22/2015  Procedures:  Chest x-ray 04/21/2015, 04/18/2015  X-rays of the left ankle 04/19/2015  Antibiotics:  IV Zosyn 04/19/2015  IV vancomycin 04/19/2015>>>> 04/21/2059  HPI/Subjective: Patient sitting in chair. Patient tearful. Patient states she's feeling very depressed about her clinical illnesses. Patient states improvement with shortness of breath. Patient complaining of diffuse pain all over and in all the joints. Patient states left ankle pain improving.  Objective: Filed Vitals:   04/22/15 0521  BP: 134/67  Pulse: 73  Temp: 97.8 F (36.6 C)  Resp: 18    Intake/Output Summary (Last 24 hours) at 04/22/15 1128 Last data filed at 04/22/15 0817  Gross per 24 hour  Intake   2070 ml  Output   5650 ml  Net  -3580 ml   Filed Weights   04/20/15 0550 04/21/15 0537 04/22/15 0521  Weight: 131.997 kg (291 lb) 131.362 kg (289 lb 9.6 oz) 130.908 kg (288 lb 9.6 oz)    Exam:   General:  NAD  Cardiovascular: RRR  Respiratory: CTAB  Abdomen: Soft/NT/ND/+BS  Musculoskeletal: No c/c/e  Data Reviewed: Basic Metabolic Panel:  Recent Labs Lab 04/18/15 2135 04/19/15 0144 04/19/15 0728 04/20/15 0438 04/21/15 0504 04/22/15 0416  NA 127*  --  129* 134* 133* 133*  K 3.4*  --  4.3 3.9 3.8 3.8  CL 88*  --  94* 95* 93* 91*  CO2 27  --  24 28 30  29  GLUCOSE 228*  --  404* 329* 289* 230*  BUN 9  --  12 17 17 14   CREATININE 1.17*  --  1.08* 1.09* 1.02* 0.95  CALCIUM 8.4*  --  8.3* 8.6* 8.4* 9.0  MG  --  1.5*  --   --   --  2.0   Liver Function Tests:  Recent Labs Lab 04/18/15 2135  04/19/15 0728 04/21/15 0504  AST 34 28 21  ALT 60* 54 47  ALKPHOS 96 93 83  BILITOT 1.2 1.6* 0.9  PROT 7.3 7.2 6.8  ALBUMIN 3.3* 3.3* 3.1*   No results for input(s): LIPASE, AMYLASE in the last 168 hours. No results for input(s): AMMONIA in the last 168 hours. CBC:  Recent Labs Lab 04/18/15 2135 04/19/15 0728 04/20/15 0438 04/21/15 0504 04/22/15 0416  WBC 8.6 8.1 10.5 8.5 10.3  NEUTROABS 6.5 7.4  --   --   --   HGB 13.2 12.9 12.0 11.9* 12.7  HCT 40.2 39.0 37.4 37.5 39.4  MCV 77.5* 77.5* 78.4 78.9 78.5  PLT 179 151 171 177 201   Cardiac Enzymes: No results for input(s): CKTOTAL, CKMB, CKMBINDEX, TROPONINI in the last 168 hours. BNP (last 3 results)  Recent Labs  12/18/14 1846 03/19/15 0441 04/18/15 2135  BNP 231.0* 61.9 15.1    ProBNP (last 3 results)  Recent Labs  07/20/14 1817  PROBNP 39.7    CBG:  Recent Labs Lab 04/21/15 2041 04/21/15 2043 04/21/15 2323 04/22/15 0105 04/22/15 0740  GLUCAP >600* 543* 441* 287* 165*    Recent Results (from the past 240 hour(s))  Blood Culture (routine x 2)     Status: None (Preliminary result)   Collection Time: 04/19/15  1:42 AM  Result Value Ref Range Status   Specimen Description BLOOD LEFT HAND  Final   Special Requests BOTTLES DRAWN AEROBIC AND ANAEROBIC 5 CC  Final   Culture NO GROWTH 2 DAYS  Final   Report Status PENDING  Incomplete  Blood Culture (routine x 2)     Status: None (Preliminary result)   Collection Time: 04/19/15  1:53 AM  Result Value Ref Range Status   Specimen Description BLOOD RIGHT HAND  Final   Special Requests BOTTLES DRAWN AEROBIC AND ANAEROBIC 5 CC  Final   Culture NO GROWTH 2 DAYS  Final   Report Status PENDING  Incomplete  Urine culture     Status: None   Collection Time: 04/19/15  2:18 AM  Result Value Ref Range Status   Specimen Description URINE, RANDOM  Final   Special Requests NONE  Final   Culture >=100,000 COLONIES/mL ESCHERICHIA COLI  Final   Report Status  04/21/2015 FINAL  Final   Organism ID, Bacteria ESCHERICHIA COLI  Final      Susceptibility   Escherichia coli - MIC*    AMPICILLIN >=32 RESISTANT Resistant     CEFAZOLIN <=4 SENSITIVE Sensitive     CEFTRIAXONE <=1 SENSITIVE Sensitive     CIPROFLOXACIN >=4 RESISTANT Resistant     GENTAMICIN <=1 SENSITIVE Sensitive     IMIPENEM <=0.25 SENSITIVE Sensitive     NITROFURANTOIN <=16 SENSITIVE Sensitive     TRIMETH/SULFA >=320 RESISTANT Resistant     AMPICILLIN/SULBACTAM >=32 RESISTANT Resistant     PIP/TAZO <=4 SENSITIVE Sensitive     * >=100,000 COLONIES/mL ESCHERICHIA COLI     Studies: Dg Chest 2 View  04/21/2015   CLINICAL DATA:  Acute respiratory failure, hypoxia  EXAM: CHEST  2 VIEW  COMPARISON:  CT angio chest of 04/19/2015 and chest x-ray of 04/18/2015  FINDINGS: Basilar linear scarring is unchanged in this patient with history of sarcoidosis. No focal infiltrate or effusion is seen. Mediastinal and hilar contours are unchanged and cardiomegaly is stable. No bony abnormality is seen.  IMPRESSION: Little change in basilar scarring in this patient with sarcoidosis. Stable cardiomegaly.   Electronically Signed   By: Ivar Drape M.D.   On: 04/21/2015 08:07    Scheduled Meds: . ALPRAZolam  1 mg Oral TID  . aspirin EC  81 mg Oral Daily  . budesonide  0.25 mg Nebulization 4 times per day  . cetirizine HCl  10 mg Oral QHS  . diclofenac sodium  4 g Topical QID  . insulin aspart  0-20 Units Subcutaneous TID WC  . insulin aspart  0-5 Units Subcutaneous QHS  . insulin aspart  6 Units Subcutaneous TID WC  . insulin glargine  68 Units Subcutaneous BID  . ipratropium-albuterol  3 mL Nebulization TID  . Macitentan  10 mg Oral Daily  . nicotine  14 mg Transdermal Daily  . omega-3 acid ethyl esters  1 g Oral Daily  . pantoprazole  40 mg Oral Daily  . piperacillin-tazobactam (ZOSYN)  IV  3.375 g Intravenous 3 times per day  . potassium chloride SA  40 mEq Oral BID  . predniSONE  20 mg Oral Q  breakfast  . sodium chloride  3 mL Intravenous Q12H  . spironolactone  25 mg Oral Daily  . torsemide  60 mg Oral BID  . varenicline  0.5 mg Oral BID WC  . venlafaxine XR  37.5 mg Oral Q breakfast   Continuous Infusions:   Principal Problem:   Acute on chronic respiratory failure with hypoxia Active Problems:   Acute on chronic diastolic heart failure   E-coli UTI   PULMONARY SARCOIDOSIS   Obstructive sleep apnea   Diabetes type 2, uncontrolled   Chronic diastolic CHF (congestive heart failure)   COPD (chronic obstructive pulmonary disease) with emphysema   Pulmonary HTN   Acute respiratory failure   Sepsis    Time spent: 39 mins    [facility] MD [facility] Pager [phone]. If 7PM-7AM, please contact night-coverage at [email or web address], password [facility] 04/22/2015, 11:28 AM  LOS: 3 days

## 2015-04-22 NOTE — Telephone Encounter (Signed)
Pt called to check the status of pulm htn meds ([facility]) Per [employer] [facility], prescription and prior auth was received, case forwarded to [facility] for shipment of medication  [facility]- does not have patient enrollment (REMS) form, will need this to get meds shipped out to patient along with signed percription and PA form if available  Pt is currently in [facility] room [phone], will have patient complete forms again for the second time and refax everything to [facility]

## 2015-04-22 NOTE — Progress Notes (Signed)
ANTIBIOTIC CONSULT NOTE  Pharmacy Consult for Zosyn  Indication: UTI  Allergies  Allergen Reactions  . Simvastatin Other (See Comments)    Severe leg pain and burning  . Sulfamethoxazole-Trimethoprim Nausea And Vomiting    Causes Projectile Vomiting  . Zolpidem Tartrate Other (See Comments)    Chest pain  . Azithromycin Other (See Comments)    Resistant to med  . Ceftin [Cefuroxime] Swelling and Other (See Comments)    MOUTH SWELLING  . Doxycycline Other (See Comments)    Resistant to med  . Latex Itching and Rash    Also burning sensations  . Metformin And Related Diarrhea    Severe diarrhea  . Metolazone Other (See Comments)    MYALGIAS  . Metronidazole Nausea And Vomiting  . Morphine-Naltrexone Palpitations  . Ciprofloxacin Nausea Only  . Tramadol Itching and Nausea Only    Patient Measurements: Height: 5\' 8"  (172.7 cm) Weight: 288 lb 9.6 oz (130.908 kg) IBW/kg (Calculated) : 63.9   Vital Signs: Temp: 97.8 F (36.6 C) (07/28 0521) Temp Source: Oral (07/28 0521) BP: 134/67 mmHg (07/28 0521) Pulse Rate: 73 (07/28 0521)  Labs:  Recent Labs  04/20/15 0438 04/21/15 0504 04/22/15 0416  WBC 10.5 8.5 10.3  HGB 12.0 11.9* 12.7  PLT 171 177 201  CREATININE 1.09* 1.02* 0.95   Estimated Creatinine Clearance: 103.7 mL/min (by C-G formula based on Cr of 0.95).  Microbiology: Recent Results (from the past 720 hour(s))  MRSA PCR Screening     Status: None   Collection Time: 04/03/15  5:13 AM  Result Value Ref Range Status   MRSA by PCR NEGATIVE NEGATIVE Final    Comment:        The GeneXpert MRSA Assay (FDA approved for NASAL specimens only), is one component of a comprehensive MRSA colonization surveillance program. It is not intended to diagnose MRSA infection nor to guide or monitor treatment for MRSA infections.   Blood Culture (routine x 2)     Status: None (Preliminary result)   Collection Time: 04/19/15  1:42 AM  Result Value Ref Range Status   Specimen Description BLOOD LEFT HAND  Final   Special Requests BOTTLES DRAWN AEROBIC AND ANAEROBIC 5 CC  Final   Culture NO GROWTH 2 DAYS  Final   Report Status PENDING  Incomplete  Blood Culture (routine x 2)     Status: None (Preliminary result)   Collection Time: 04/19/15  1:53 AM  Result Value Ref Range Status   Specimen Description BLOOD RIGHT HAND  Final   Special Requests BOTTLES DRAWN AEROBIC AND ANAEROBIC 5 CC  Final   Culture NO GROWTH 2 DAYS  Final   Report Status PENDING  Incomplete  Urine culture     Status: None   Collection Time: 04/19/15  2:18 AM  Result Value Ref Range Status   Specimen Description URINE, RANDOM  Final   Special Requests NONE  Final   Culture >=100,000 COLONIES/mL ESCHERICHIA COLI  Final   Report Status 04/21/2015 FINAL  Final   Organism ID, Bacteria ESCHERICHIA COLI  Final      Susceptibility   Escherichia coli - MIC*    AMPICILLIN >=32 RESISTANT Resistant     CEFAZOLIN <=4 SENSITIVE Sensitive     CEFTRIAXONE <=1 SENSITIVE Sensitive     CIPROFLOXACIN >=4 RESISTANT Resistant     GENTAMICIN <=1 SENSITIVE Sensitive     IMIPENEM <=0.25 SENSITIVE Sensitive     NITROFURANTOIN <=16 SENSITIVE Sensitive     TRIMETH/SULFA >=320  RESISTANT Resistant     AMPICILLIN/SULBACTAM >=32 RESISTANT Resistant     PIP/TAZO <=4 SENSITIVE Sensitive     * >=100,000 COLONIES/mL ESCHERICHIA COLI    Medical History: Past Medical History  Diagnosis Date  . Hypertension   . Hyperlipidemia   . Chronic headache   . Fibromyalgia     daily narcotics  . Anxiety     hx chronic BZ use, stopped 07/2010  . Anemia   . Pulmonary sarcoidosis     unimpressive CT chest 2011  . Colonic polyp   . GERD (gastroesophageal reflux disease)   . ALLERGIC RHINITIS   . Asthma   . CHF (congestive heart failure)     Diastolic with fluid overload, May, 2012, LVEF 60%  . Morbid obesity   . Depression   . Panic attacks   . Diabetes mellitus   . COPD (chronic obstructive pulmonary  disease)     on home O2, moderate airflow obstruction, suspect d/t emphysema  . Obesity   . Elevated LFTs 09/2011  . Ovarian cyst   . Chronic back pain   . Sleep apnea      CPAP  . Fatty liver   . Esophagitis   . Gastritis   . Internal hemorrhoids   . Adenomatous colon polyp 12/03/09    [facility] in [place], [place]  . Hypoxia 03/2015  . Shortness of breath dyspnea     Assessment: 49 y/o F with recent DC on 7/17, back with shortness of breath, new RUL infiltrate on CT on presentation. Pt admitted for COPD exacerbation.  Zosyn for UTI, PNA ruled out, afebrile, WBC WNL, LA 2.1 >1.7   7/25 vanc > 7/27 7/25 Zosyn > 7/28  7/25 blood cx: NGTD 7/25 urine cx: > 100K E coli, resistant to ampicillin, Unasyn, Bactrim, cipro   Plan:  - Zosyn 3.375gm IV Q8H, 4 hr infusion (No cephalosporins for UTI d/t mouth swelling with ceftin) - D/c Zosyn after last infusion today - Monitor renal fx  Joya San, PharmD Clinical Pharmacist Pager # [phone] 04/22/2015 10:52 AM

## 2015-04-22 NOTE — Consult Note (Signed)
Reason for Consult: Left ankle pain, swelling Referring Physician:  Grandville Silos, MD  [name] is an 49 y.o. female.  HPI:  49yo female admitted for medical condition Asked to evaluate left ankle for pain and swelling.   No trauma reported. No known history of gout No history of diabetic foot ulcers  Past Medical History  Diagnosis Date  . Hypertension   . Hyperlipidemia   . Chronic headache   . Fibromyalgia     daily narcotics  . Anxiety     hx chronic BZ use, stopped 07/2010  . Anemia   . Pulmonary sarcoidosis     unimpressive CT chest 2011  . Colonic polyp   . GERD (gastroesophageal reflux disease)   . ALLERGIC RHINITIS   . Asthma   . CHF (congestive heart failure)     Diastolic with fluid overload, May, 2012, LVEF 60%  . Morbid obesity   . Depression   . Panic attacks   . Diabetes mellitus   . COPD (chronic obstructive pulmonary disease)     on home O2, moderate airflow obstruction, suspect d/t emphysema  . Obesity   . Elevated LFTs 09/2011  . Ovarian cyst   . Chronic back pain   . Sleep apnea      CPAP  . Fatty liver   . Esophagitis   . Gastritis   . Internal hemorrhoids   . Adenomatous colon polyp 12/03/09    [facility] in [place], [place]  . Hypoxia 03/2015  . Shortness of breath dyspnea     Past Surgical History  Procedure Laterality Date  . Polypectomy  2011  . Lumbar microdiscectomy  07/06/2011    R L4-5, stern  . Back surgery    . Carpal tunnel release    . Steroid spinal injections    . Hysteroscopy w/d&c N/A 03/25/2013    Procedure: DILATATION AND CURETTAGE /HYSTEROSCOPY;  Surgeon: [name], MD;  Location: [facility];  Service: Gynecology;  Laterality: N/A;  . Dilation and curettage of uterus    . Cardiac catheterization N/A 03/17/2015    Procedure: Right Heart Cath;  Surgeon: Larey Dresser, MD;  Location: [facility];  Service: Cardiovascular;  Laterality: N/A;    Family History  Problem Relation Age of Onset  .  Hypertension Mother   . Heart disease Mother   . Emphysema Father   . Hypertension Father   . Stomach cancer Father   . Heart disease Father   . Allergies Brother   . Stomach cancer Brother   . Congestive Heart Failure Brother   . Diabetes type II Brother   . Heart disease Sister   . Diabetes type II Sister   . Heart attack Sister   . Stroke Sister   . Hypertension Sister   . Heart failure Sister     diastolic  . Hypertension Sister     Social History:  reports that she quit smoking about 4 months ago. Her smoking use included Cigarettes. She has a 7.25 pack-year smoking history. She has never used smokeless tobacco. She reports that she drinks alcohol. She reports that she uses illicit drugs (Cocaine and Marijuana).  Allergies:  Allergies  Allergen Reactions  . Simvastatin Other (See Comments)    Severe leg pain and burning  . Sulfamethoxazole-Trimethoprim Nausea And Vomiting    Causes Projectile Vomiting  . Zolpidem Tartrate Other (See Comments)    Chest pain  . Azithromycin Other (See Comments)    Resistant to med  .  Ceftin [Cefuroxime] Swelling and Other (See Comments)    MOUTH SWELLING  . Doxycycline Other (See Comments)    Resistant to med  . Latex Itching and Rash    Also burning sensations  . Metformin And Related Diarrhea    Severe diarrhea  . Metolazone Other (See Comments)    MYALGIAS  . Metronidazole Nausea And Vomiting  . Morphine-Naltrexone Palpitations  . Ciprofloxacin Nausea Only  . Tramadol Itching and Nausea Only    Medications:  I have reviewed the patient's current medications. Scheduled: . ALPRAZolam  1 mg Oral TID  . aspirin EC  81 mg Oral Daily  . budesonide  0.25 mg Nebulization 4 times per day  . cetirizine HCl  10 mg Oral QHS  . diclofenac sodium  4 g Topical QID  . insulin aspart  0-20 Units Subcutaneous TID WC  . insulin aspart  0-5 Units Subcutaneous QHS  . insulin aspart  6 Units Subcutaneous TID WC  . insulin glargine  68  Units Subcutaneous BID  . ipratropium-albuterol  3 mL Nebulization Q6H  . Macitentan  10 mg Oral Daily  . nicotine  14 mg Transdermal Daily  . omega-3 acid ethyl esters  1 g Oral Daily  . pantoprazole  40 mg Oral Daily  . piperacillin-tazobactam (ZOSYN)  IV  3.375 g Intravenous 3 times per day  . potassium chloride SA  40 mEq Oral BID  . predniSONE  20 mg Oral Q breakfast  . sodium chloride  3 mL Intravenous Q12H  . spironolactone  25 mg Oral Daily  . torsemide  60 mg Oral BID  . varenicline  0.5 mg Oral BID WC  . venlafaxine XR  37.5 mg Oral Q breakfast    Results for orders placed or performed during the hospital encounter of 04/18/15 (from the past 24 hour(s))  Glucose, capillary     Status: Abnormal   Collection Time: 04/21/15  8:05 AM  Result Value Ref Range   Glucose-Capillary 205 (H) 65 - 99 mg/dL  Glucose, capillary     Status: Abnormal   Collection Time: 04/21/15 11:16 AM  Result Value Ref Range   Glucose-Capillary 239 (H) 65 - 99 mg/dL  Glucose, capillary     Status: Abnormal   Collection Time: 04/21/15  4:20 PM  Result Value Ref Range   Glucose-Capillary 415 (H) 65 - 99 mg/dL  Glucose, capillary     Status: Abnormal   Collection Time: 04/21/15  8:41 PM  Result Value Ref Range   Glucose-Capillary >600 (HH) 65 - 99 mg/dL  Glucose, capillary     Status: Abnormal   Collection Time: 04/21/15  8:43 PM  Result Value Ref Range   Glucose-Capillary 543 (H) 65 - 99 mg/dL  Glucose, capillary     Status: Abnormal   Collection Time: 04/21/15 11:23 PM  Result Value Ref Range   Glucose-Capillary 441 (H) 65 - 99 mg/dL  Glucose, capillary     Status: Abnormal   Collection Time: 04/22/15  1:05 AM  Result Value Ref Range   Glucose-Capillary 287 (H) 65 - 99 mg/dL  CBC     Status: Abnormal   Collection Time: 04/22/15  4:16 AM  Result Value Ref Range   WBC 10.3 4.0 - 10.5 K/uL   RBC 5.02 3.87 - 5.11 MIL/uL   Hemoglobin 12.7 12.0 - 15.0 g/dL   HCT 39.4 36.0 - 46.0 %   MCV 78.5  78.0 - 100.0 fL   MCH 25.3 (L) 26.0 -  34.0 pg   MCHC 32.2 30.0 - 36.0 g/dL   RDW 17.2 (H) 11.5 - 15.5 %   Platelets 201 150 - 400 K/uL  Basic metabolic panel     Status: Abnormal   Collection Time: 04/22/15  4:16 AM  Result Value Ref Range   Sodium 133 (L) 135 - 145 mmol/L   Potassium 3.8 3.5 - 5.1 mmol/L   Chloride 91 (L) 101 - 111 mmol/L   CO2 29 22 - 32 mmol/L   Glucose, Bld 230 (H) 65 - 99 mg/dL   BUN 14 6 - 20 mg/dL   Creatinine, Ser 0.95 0.44 - 1.00 mg/dL   Calcium 9.0 8.9 - 10.3 mg/dL   GFR calc non Af Amer >60 >60 mL/min   GFR calc Af Amer >60 >60 mL/min   Anion gap 13 5 - 15  Magnesium     Status: None   Collection Time: 04/22/15  4:16 AM  Result Value Ref Range   Magnesium 2.0 1.7 - 2.4 mg/dL   *Note: Due to a large number of results and/or encounters for the requested time period, some results have not been displayed. A complete set of results can be found in Results Review.    X-ray: CLINICAL DATA: Left ankle pain and swelling. No known trauma.  EXAM: LEFT ANKLE - 2 VIEW  COMPARISON: None.  FINDINGS: No fracture or dislocation. The alignment and joint spaces are maintained. The ankle mortise is preserved. No erosion or periosteal reaction. Mild soft tissue edema is noted. No radiopaque foreign body.  IMPRESSION: Soft tissue edema without associated osseous abnormality.   ROS  REVIEW OF SYSTEMS:  Bolds are positive  Constitutional: weight loss, gain, night sweats, Fevers, chills, fatigue .  HEENT: headaches, Sore throat, sneezing, nasal congestion, post nasal drip, Difficulty swallowing, Tooth/dental problems, visual complaints visual changes, ear ache CV: chest pain, radiates: ,Orthopnea, PND, swelling in lower extremities, dizziness, palpitations, syncope.  GI heartburn, indigestion, abdominal pain, nausea, vomiting, diarrhea, change in bowel habits, loss of appetite, bloody stools.  Resp: cough, non-productive: , hemoptysis, dyspnea,  chest pain, pleuritic.  Skin: rash or itching or icterus GU: dysuria, change in color of urine, urgency or frequency. flank pain, hematuria  MS: joint pain or swelling L>R ankles. decreased range of motion  Psych: change in mood or affect. depression or anxiety.  Neuro: difficulty with speech, weakness, numbness, ataxia   Blood pressure 134/67, pulse 73, temperature 97.8 F (36.6 C), temperature source Oral, resp. rate 18, height 5\' 8"  (1.727 m), weight 130.908 kg (288 lb 9.6 oz), last menstrual period 10/16/2012, SpO2 96 %.  Physical Exam  General medical exam reviewed pertinent to admission  Left ankle no warmth or larger effusion appreciated but tender Also noted was significant tenderness over left great toe consistent with Podagra  Sone generalized lower extremity edema No erythema or concerns for cellulitis No foot ulcers  Assessment/Plan: Left ankle pain associated with left great toe pain felt to be clinically similar to gout presentation No evidence of infection or significant enough effusion to aspirate either one.  Patient had reported that this was a concern and Uric Acid level already ordered (8.8)  No Orthopaedic follow up needed at this time instead medical management of gout recommended  Miranda Garber D 04/22/2015, 7:15 AM

## 2015-04-22 NOTE — Progress Notes (Signed)
Name: NIKKIA DEVOSS MRN: [id number] DOB: Aug 28, 1966    ADMISSION DATE:  04/18/2015 CONSULTATION DATE:  04/19/2015  REFERRING MD :  Dr. Daleen Bo  CHIEF COMPLAINT:  SOB  BRIEF PATIENT DESCRIPTION: 49 year old female with pulmonary sarcoidosis and pulmonary HTN (RA pt) admitted to [facility] 7/24 for respiratory failure secondary to presumed HCAP/sepsis. PCCM to consult.  SIGNIFICANT EVENTS    STUDIES:  6/22 RHC> RA mean 16, RV 73/22, PA 70/32, mean 47, PCWP mean 20. Oxygen saturations:PA 69% AO 96% Cardiac Output (Fick) 6.56 Cardiac Index (Fick) 2.74, PVR 4.1 WU, Cardiac Output (Thermo) 4.95, Cardiac Index (Thermo) 2.07, PVR 5.5 WU CTA chest 7/25 > Negative for PE, new RUL opacification LE Doppler 7/25 >>>neg DVT   SUBJECTIVE: Was a little more SOB this am when she woke up, now improved.  Feels significantly better since admission.  Does feel her bipap isnt as "strong" as it used to be and she has definitely gained weight since her last titration. Denies chest pain, purulent sputum.   VITAL SIGNS: Temp:  [97.8 F (36.6 C)-98.1 F (36.7 C)] 97.8 F (36.6 C) (07/28 0521) Pulse Rate:  [73-80] 73 (07/28 0521) Resp:  [18] 18 (07/28 0521) BP: (130-134)/(59-67) 134/67 mmHg (07/28 0521) SpO2:  [96 %-99 %] 96 % (07/28 0521) FiO2 (%):  [92 %-99 %] 92 % (07/28 0743) Weight:  [288 lb 9.6 oz (130.908 kg)] 288 lb 9.6 oz (130.908 kg) (07/28 0521)  PHYSICAL EXAMINATION: General:  No distress HENT: NCAT EOMi PULM: resps even non labored on 4L [place], Good air movement no wheezing CV: RRR, no mgr MSK: mild edema, normal bulk/tone GI: BS+, soft Derm: no rash   Recent Labs Lab 04/20/15 0438 04/21/15 0504 04/22/15 0416  NA 134* 133* 133*  K 3.9 3.8 3.8  CL 95* 93* 91*  CO2 28 30 29   BUN 17 17 14   CREATININE 1.09* 1.02* 0.95  GLUCOSE 329* 289* 230*    Recent Labs Lab 04/20/15 0438 04/21/15 0504 04/22/15 0416  HGB 12.0 11.9* 12.7  HCT 37.4 37.5 39.4  WBC 10.5 8.5 10.3  PLT 171 177  201   CTAngio chest 7/25> chronic cysts and interlobular septal thickening noted, increase ggo in RUL, no PE, some ggo bases  ASSESSMENT / PLAN:  Acute on chronic hypoxemic respiratory failure > presumably pulm edema as weight up 10+ pounds at home COPD with acute exacerbation > mild Pulmonary sarcoidosis > no clear evidence of progression OSA on BiPAP  - Supplemental O2 as needed to maintain SpO2 > 90% - chronically 4L - BiPAP QHS - Prednisone taper - 20mg  for 4 days, then 10mg  for 3 days then off - continue Bronchodilators nebulized, with budesonide - repeat CXR intermittently  - likely needs titration of home BiPAP or to repeat PSG as outpatient as her weight has fluctuated a good bit recently    Doubt HCAP as no mucus production, no clear consolidation - d/c vanc 7/27 - will leave pip/tazo for now for Ecoli UTI - Follow cultures  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 It is reported approved by her insurance, Issue is with pharmacy - continue diuresis with torsemide today. If this works in hospital to keep her net even then OK to discharge tomorrow from my standpoint  PCCM will sign off, call if questions  Roselie Awkward, MD [facility] Pager: [phone] Cell: [phone] After 3pm or if  no response, call [phone]

## 2015-04-22 NOTE — Progress Notes (Signed)
Patient stated she will put the CPAP mask on herself when she is ready. RT notified patient to call if assistance is needed.

## 2015-04-22 NOTE — Progress Notes (Signed)
Patient requested MD be contacted again for pain shot. Po pain med not due until 1050pm, NP Baltazar Najjar was paged again.

## 2015-04-22 NOTE — Progress Notes (Signed)
Patient c/o pain in lower abdomen and vaginal area from the UTI. States pain level is 9/10. Pain is constant stabbing and sharpe and had po pain med 2 hours ago and requesting a prn "shot". Also states the pain is radiating. NP Baltazar Najjar paged. [facility] [employer]

## 2015-04-23 DIAGNOSIS — F332 Major depressive disorder, recurrent severe without psychotic features: Secondary | ICD-10-CM | POA: Clinically undetermined

## 2015-04-23 LAB — CBC
HCT: 40.4 % (ref 36.0–46.0)
Hemoglobin: 12.9 g/dL (ref 12.0–15.0)
MCH: 25.2 pg — AB (ref 26.0–34.0)
MCHC: 31.9 g/dL (ref 30.0–36.0)
MCV: 79.1 fL (ref 78.0–100.0)
PLATELETS: 219 10*3/uL (ref 150–400)
RBC: 5.11 MIL/uL (ref 3.87–5.11)
RDW: 17.3 % — ABNORMAL HIGH (ref 11.5–15.5)
WBC: 10.4 10*3/uL (ref 4.0–10.5)

## 2015-04-23 LAB — BASIC METABOLIC PANEL
Anion gap: 11 (ref 5–15)
BUN: 15 mg/dL (ref 6–20)
CO2: 32 mmol/L (ref 22–32)
Calcium: 9.4 mg/dL (ref 8.9–10.3)
Chloride: 88 mmol/L — ABNORMAL LOW (ref 101–111)
Creatinine, Ser: 1.18 mg/dL — ABNORMAL HIGH (ref 0.44–1.00)
GFR calc Af Amer: >60 mL/min (ref >60)
GFR, EST NON AFRICAN AMERICAN: 54 mL/min — AB (ref >60)
Glucose, Bld: 302 mg/dL — ABNORMAL HIGH (ref 65–99)
Potassium: 4.1 mmol/L (ref 3.5–5.1)
SODIUM: 131 mmol/L — AB (ref 135–145)

## 2015-04-23 LAB — GLUCOSE, CAPILLARY
Glucose-Capillary: 354 mg/dL — ABNORMAL HIGH (ref 65–99)
Glucose-Capillary: 233 mg/dL — ABNORMAL HIGH (ref 65–99)
Glucose-Capillary: 349 mg/dL — ABNORMAL HIGH (ref 65–99)

## 2015-04-23 MED ORDER — PREDNISONE 10 MG PO TABS: 20.0000 mg | ORAL_TABLET | Freq: Every day | ORAL | Status: DC

## 2015-04-23 MED ORDER — INSULIN GLARGINE 100 UNIT/ML ~~LOC~~ SOLN
72.0000 [IU] | Freq: Two times a day (BID) | SUBCUTANEOUS | Status: DC
  Administered 2015-04-23: 72 [IU] via SUBCUTANEOUS
  Filled 2015-04-23 (×2): qty 0.72

## 2015-04-23 MED ORDER — IPRATROPIUM-ALBUTEROL 0.5-2.5 (3) MG/3ML IN SOLN: 3.0000 mL | Freq: Three times a day (TID) | RESPIRATORY_TRACT | Status: DC

## 2015-04-23 MED ORDER — BUDESONIDE 0.25 MG/2ML IN SUSP: 0.2500 mg | Freq: Four times a day (QID) | RESPIRATORY_TRACT | Status: DC

## 2015-04-23 MED ORDER — BUPROPION HCL ER (XL) 150 MG PO TB24: 150.0000 mg | ORAL_TABLET | Freq: Every day | ORAL | Status: DC

## 2015-04-23 MED ORDER — PHENAZOPYRIDINE HCL 200 MG PO TABS: 200.0000 mg | ORAL_TABLET | Freq: Three times a day (TID) | ORAL | Status: DC | PRN

## 2015-04-23 MED ORDER — BUPROPION HCL ER (XL) 150 MG PO TB24
150.0000 mg | ORAL_TABLET | Freq: Every day | ORAL | Status: DC
  Administered 2015-04-23: 150 mg via ORAL
  Filled 2015-04-23: qty 1

## 2015-04-23 MED ORDER — VARENICLINE TARTRATE 0.5 MG PO TABS: 0.5000 mg | ORAL_TABLET | Freq: Two times a day (BID) | ORAL | Status: DC

## 2015-04-23 MED ORDER — INSULIN ASPART 100 UNIT/ML ~~LOC~~ SOLN
10.0000 [IU] | Freq: Three times a day (TID) | SUBCUTANEOUS | Status: DC
  Administered 2015-04-23 (×3): 10 [IU] via SUBCUTANEOUS

## 2015-04-23 MED ORDER — INSULIN GLARGINE 100 UNIT/ML SOLOSTAR PEN: 72.0000 [IU] | PEN_INJECTOR | Freq: Two times a day (BID) | SUBCUTANEOUS | Status: DC

## 2015-04-23 NOTE — Discharge Summary (Signed)
Physician Discharge Summary  [name] IWP:[id number] DOB: 10-07-1965 DOA: 04/18/2015  PCP: Gwendolyn Grant, MD  Admit date: 04/18/2015 Discharge date: 04/23/2015  Time spent: 70 minutes  Recommendations for Outpatient Follow-up:  1. Follow-up with Gwendolyn Grant, MD in 1 week. On follow-up patient needs a basic metabolic profile done to follow-up on electrolytes and renal function as patient is on chronic diuretics. Patient will also likely need a referral to outpatient psychiatry for management of her depression. Patient diabetes and need to be reassessed as patient's Lantus dose has been increased to 72 units twice daily. 2. Patient is to follow-up with Dr. Haroldine Laws in the heart failure clinic on 04/27/2015. Patient pulmonary hypertension need to be addressed then as well as her CHF.  Discharge Diagnoses:  Principal Problem:   MDD (major depressive disorder), recurrent severe, without psychosis Active Problems:   Acute on chronic diastolic heart failure   E-coli UTI   PULMONARY SARCOIDOSIS   Obstructive sleep apnea   Diabetes type 2, uncontrolled   Chronic diastolic CHF (congestive heart failure)   COPD (chronic obstructive pulmonary disease) with emphysema   Pulmonary HTN   Acute on chronic respiratory failure with hypoxia   Acute respiratory failure   Sepsis   Discharge Condition: Stable and improved  Diet recommendation: Heart healthy  Filed Weights   04/21/15 0537 04/22/15 0521 04/23/15 0500  Weight: 131.362 kg (289 lb 9.6 oz) 130.908 kg (288 lb 9.6 oz) 132.405 kg (291 lb 14.4 oz)    History of present illness:  Per Dr Wilford Corner is a 49 y.o. female with history of severe pulmonary hypertension, COPD, diabetes mellitus, OSA and chronic kidney disease presented to the ER because of shortness of breath and palpitations. Patient stated she had been having shortness of breath and palpitations since morning. Denied any chest pain and had been having  some nonproductive cough. In addition patient had been complaining of left ankle pain. Patient left ankle pain also started from the morning prior to admission. In the ER patient was found to be febrile and CT angiogram of the chest done showed possible new infiltrates concerning for pneumonia. On exam patient's ankle was tender and x-rays revealed mild edema. Patient was being admitted for further management. Patient denied any nausea vomiting abdominal pain diarrhea. Patient stated she was discharged on [place] for her pulmonary hypertension which patient was not able to fill it.   Hospital Course:  #1 acute on chronic hypoxemic respiratory failure likely secondary to pulmonary edema. An acute COPD exacerbation  Patient had presented with acute on chronic hypoxemic respiratory failure felt to be secondary to acute CHF exacerbation and acute COPD exacerbation. CT angiogram of the chest which was done showed possible new infiltrates however negative for pulmonary emboli. Patient was empirically placed on IV antibiotics, oxygen, steroids taper, IV diuretics. Pulmonary was consulted who followed the patient throughout the hospitalization. Patient diuresed -18.97 L throughout the hospitalization. Patient improved clinically. Patient was subsequently transitioned to oral Demadex which was a home dose and had good diuresis with it. Patient was transitioned to oral prednisone on the discharged on a slow steroids taper. Patient will also be discharged on [place] dilated with budesonide. Patient be discharged in stable and improved condition. Patient will follow-up with her heart failure clinic on 04/27/2015 area patient will also follow-up with pulmonary as previously scheduled. Patient also follow-up with PCP as outpatient.   #2 acute diastolic CHF exacerbation Patient had presented with acute on chronic hypoxemic respiratory  failure which was felt in part to be secondary to acute diastolic CHF exacerbation  and an acute COPD exacerbation. Patient was placed on IV Lasix with good diuresis during the hospitalization. Patient diuresed -18.97 L during the hospitalization. Patient was subsequently transitioned to her home dose of oral Demadex with good urine output. Patient improved clinically and patient be discharged in stable and improved condition. Patient is to follow-up with the heart failure clinic on Tuesday, 04/27/2015.  #3 acute COPD exacerbation Patient was admitted with acute respiratory distress felt to be multifactorial in part secondary to acute COPD exacerbation. Patient was placed on nebulizer treatments, antibiotics, steroids taper, oxygen and BiPAP as needed. Patient was followed by pulmonary throughout the hospitalization. Patient improved clinically. Patient's nebulizer treatments which changed to Pulmicort and do nebs. Patient steroids were tapered down and patient was discharged home on a slow steroids taper as well as Pulmicort nebulizers and duo nebs. Patient will follow-up with pulmonary as outpatient as previously scheduled.   #4 pulmonary hypertension Outpatient follow-up.  #5 left ankle and left great toe swelling/possible acute gouty flare Patient complaining of left ankle and left great toe swelling. It was felt this was likely an acute gouty flare. Patient was placed on steroids during the hospitalization with significant improvement. Outpatient follow-up.  #6 chronic kidney disease stage III Remained stable throughout the hospitalization.    #7 depression Patient very tearful and depressed during the hospitalization. Patient was on Effexor and Xanax prior to admission. Psychiatric consultation was obtained and patient was seen in consultation by Dr. Louretta Shorten. It was recommended per psychiatry to discontinue patient's Effexor and patient was started on Wellbutrin XL 150 mg daily in addition to her home regimen of Xanax. Was recommended outpatient follow-up with psychiatry  as outpatient.  #8 history of sarcoidosis Per pulmonary. It was felt patient did not have a flare of her cycle doses. Outpatient follow-up as previously scheduled.  #9 poorly controlled diabetes mellitus type 2 Patient was noted to have a A1c of 9.3 on 04/04/2015. Patient's blood sugars ranged and went up as high as 400s during the hospitalization. Patient had been on steroids and was felt this played a component in her hyperglycemia. Patient's oral hypoglycemic agents were held. Patient was maintained on Lantus and doses adjusted such that by day of discharge patient was being discharged on Lantus 72 units twice daily. Patient was also maintained on meal coverage is less than sliding scale insulin. Patient is to follow-up with PCP as outpatient.  #10 Escherichia coli UTI Patient received 3 days of empiric IV antibiotics of Zosyn for her Escherichia coli UTI which was sensitive to Zosyn.  #11 tobacco abuse Resumed home dose chantix.  Procedures:  Chest x-ray 04/21/2015, 04/18/2015  X-rays of the left ankle 04/19/2015 Consultations:  PCCM: Georgann Housekeeper, NP 04/19/2015  Orthopedics: Dr. Alvan Dame 04/22/2015  Psychiatry: Dr. Louretta Shorten 04/23/2015  Discharge Exam: Filed Vitals:   04/23/15 1643  BP: 133/71  Pulse: 76  Temp: 98.5 F (36.9 C)  Resp: 18    General: NAD Cardiovascular: RRR Respiratory: CTAB  Discharge Instructions   Discharge Instructions    Diet - low sodium heart healthy    Complete by:  As directed      Discharge instructions    Complete by:  As directed   Follow up with Gwendolyn Grant, MD in 1 week. Follow up at heart failure clinic.     Increase activity slowly    Complete by:  As directed  Current Discharge Medication List    START taking these medications   Details  budesonide (PULMICORT) 0.25 MG/2ML nebulizer solution Take 2 mLs (0.25 mg total) by nebulization every 6 (six) hours. Qty: 60 mL, Refills: 12    buPROPion (WELLBUTRIN  XL) 150 MG 24 hr tablet Take 1 tablet (150 mg total) by mouth daily. Qty: 30 tablet, Refills: 2    ipratropium-albuterol (DUONEB) 0.5-2.5 (3) MG/3ML SOLN Take 3 mLs by nebulization 3 (three) times daily. Qty: 360 mL, Refills: 3    phenazopyridine (PYRIDIUM) 200 MG tablet Take 1 tablet (200 mg total) by mouth 3 (three) times daily as needed for pain. Qty: 20 tablet, Refills: 0    predniSONE (DELTASONE) 10 MG tablet Take 2 tablets (20 mg total) by mouth daily with breakfast. Take 2 tablets (20mg ) daily x 4 days, then 1 tablet (10mg ) daily x 3 days then stop. Qty: 12 tablet, Refills: 0    varenicline (CHANTIX) 0.5 MG tablet Take 1 tablet (0.5 mg total) by mouth 2 (two) times daily with a meal.      CONTINUE these medications which have CHANGED   Details  Insulin Glargine (LANTUS SOLOSTAR) 100 UNIT/ML Solostar Pen Inject 72 Units into the skin 2 (two) times daily. Qty: 15 mL, Refills: 2      CONTINUE these medications which have NOT CHANGED   Details  albuterol (PROVENTIL) (2.5 MG/3ML) 0.083% nebulizer solution INHALE CONTENTS OF 1 VIAL VIA NEBULIZER EVERY 6 HOURS AS NEEDED FOR WHEEZING OR SHORTNESS OF BREATH Qty: 1125 mL, Refills: 0    ALPRAZolam (XANAX) 1 MG tablet Take 1 tablet (1 mg total) by mouth 3 (three) times daily. Qty: 90 tablet, Refills: 3    aspirin EC 81 MG tablet Take 81 mg by mouth daily.    BIOTIN PO Take 1 capsule by mouth daily.    cetirizine (ZYRTEC) 1 MG/ML syrup Take 10 mLs (10 mg total) by mouth at bedtime. Qty: 300 mL, Refills: 0    diclofenac sodium (VOLTAREN) 1 % GEL Apply 4 g topically 4 (four) times daily. Qty: 100 g, Refills: 0    esomeprazole (NEXIUM) 40 MG capsule Take 40 mg by mouth 2 (two) times daily before a meal.    fluticasone (FLONASE) 50 MCG/ACT nasal spray Place 2 sprays into both nostrils daily. Qty: 16 g, Refills: 5    gabapentin (NEURONTIN) 300 MG capsule Take 300 mg by mouth daily as needed (neuropathy pain).     glimepiride  (AMARYL) 1 MG tablet Take 1 mg by mouth daily with breakfast.    hydrOXYzine (ATARAX/VISTARIL) 10 MG tablet TAKE 1 TABLET BY MOUTH THREE TIMES DAILY AS NEEDED FOR ITCHING Qty: 30 tablet, Refills: 11    insulin aspart (NOVOLOG FLEXPEN) 100 UNIT/ML FlexPen Inject 5 Units into the skin 3 (three) times daily with meals. Qty: 21 mL, Refills: 0    Multiple Vitamin (MULTIVITAMIN) capsule Take 1 capsule by mouth once a week. Mondays    omega-3 fish oil (MAXEPA) 1000 MG CAPS capsule Take 1 capsule by mouth daily.    potassium chloride SA (K-DUR,KLOR-CON) 20 MEQ tablet Take 2 tablets (40 mEq total) by mouth 2 (two) times daily. Qty: 180 tablet, Refills: 11   Associated Diagnoses: Acute on chronic diastolic CHF (congestive heart failure)    PROAIR HFA 108 (90 BASE) MCG/ACT inhaler INHALE 2 PUFFS BY MOUTH EVERY 6 HOURS AS NEEDED FOR WHEEZING OR SHORTNESS OF BREATH Qty: 8.5 g, Refills: 0    spironolactone (ALDACTONE) 25 MG tablet  Take 1 tablet (25 mg total) by mouth daily. Qty: 30 tablet, Refills: 0    tiZANidine (ZANAFLEX) 2 MG tablet Take 2 mg by mouth 2 (two) times daily as needed for muscle spasms.  Refills: 3    torsemide (DEMADEX) 20 MG tablet Take 3 tablets (60 mg total) by mouth 2 (two) times daily. Qty: 180 tablet, Refills: 0    Macitentan 10 MG TABS Take 10 mg by mouth daily. Qty: 30 tablet, Refills: 1      STOP taking these medications     budesonide-formoterol (SYMBICORT) 160-4.5 MCG/ACT inhaler      tiotropium (SPIRIVA HANDIHALER) 18 MCG inhalation capsule      venlafaxine XR (EFFEXOR XR) 37.5 MG 24 hr capsule        Allergies  Allergen Reactions  . Simvastatin Other (See Comments)    Severe leg pain and burning  . Sulfamethoxazole-Trimethoprim Nausea And Vomiting    Causes Projectile Vomiting  . Zolpidem Tartrate Other (See Comments)    Chest pain  . Azithromycin Other (See Comments)    Resistant to med  . Ceftin [Cefuroxime] Swelling and Other (See Comments)     MOUTH SWELLING  . Doxycycline Other (See Comments)    Resistant to med  . Latex Itching and Rash    Also burning sensations  . Metformin And Related Diarrhea    Severe diarrhea  . Metolazone Other (See Comments)    MYALGIAS  . Metronidazole Nausea And Vomiting  . Morphine-Naltrexone Palpitations  . Ciprofloxacin Nausea Only  . Tramadol Itching and Nausea Only   Follow-up Information    Follow up with [facility].   Specialty:  Home Health Services   Why:  Registered Nurse and Physical Therapy   Contact information:   [place] [address] [phone]       Follow up with Glori Bickers, MD. Go on 04/27/2015.   Specialty:  Cardiology   Why:  @10 :20   Contact information:   [address] [place] [address] [phone]       Follow up with Gwendolyn Grant, MD. Go on 04/27/2015.   Specialty:  Internal Medicine   Why:  @2 :00   Contact information:   [address] [place]  [address] [phone]       Call today to follow up.   Why:  Psychiatrist recommendation for outpatient follow-up upon discharge from hospital.   Contact information:   Please call number provided on back of health insurance card to access outpatient psychiatric services at discharge.       The results of significant diagnostics from this hospitalization (including imaging, microbiology, ancillary and laboratory) are listed below for reference.    Significant Diagnostic Studies: Dg Chest 2 View  04/21/2015   CLINICAL DATA:  Acute respiratory failure, hypoxia  EXAM: CHEST  2 VIEW  COMPARISON:  CT angio chest of 04/19/2015 and chest x-ray of 04/18/2015  FINDINGS: Basilar linear scarring is unchanged in this patient with history of sarcoidosis. No focal infiltrate or effusion is seen. Mediastinal and hilar contours are unchanged and cardiomegaly is stable. No bony abnormality is seen.  IMPRESSION:  Little change in basilar scarring in this patient with sarcoidosis. Stable cardiomegaly.   Electronically Signed   By: Ivar Drape M.D.   On: 04/21/2015 08:07   Dg Chest 2 View  04/18/2015   CLINICAL DATA:  Shortness of breath.  History of sarcoidosis  EXAM: CHEST  2 VIEW  COMPARISON:  April 12, 2015 and March 15, 2015 chest radiograph; chest CT March 16, 2015  FINDINGS: There is stable scarring/ fibrotic type change in the lung bases, more severe on the right than on the left. There is also mild scarring in the medial left upper lobe. There is no frank edema or consolidation. The heart is upper normal in size with pulmonary vascularity within normal limits. No adenopathy apparent. No bone lesions.  IMPRESSION: Areas of scarring, most pronounced in the right lower lobe, stable. No new opacity. No change in cardiac silhouette.   Electronically Signed   By: Lowella Grip III M.D.   On: 04/18/2015 21:53   Dg Chest 2 View  04/02/2015   CLINICAL DATA:  Patient with shortness of breath. History of pulmonary hypertension.  EXAM: CHEST  2 VIEW  COMPARISON:  Chest radiograph 03/22/2015  FINDINGS: Monitoring leads overlie the patient. Stable cardiac and mediastinal contours. Unchanged bilateral coarse interstitial opacities predominately involving the lower lungs bilaterally. No superimposed consolidative opacity. No pleural effusion or pneumothorax. Regional skeleton is unremarkable.  IMPRESSION: No acute cardiopulmonary process.  Unchanged basilar scarring compatible with sarcoidosis.   Electronically Signed   By: Lovey Newcomer M.D.   On: 04/02/2015 19:14   Dg Ankle 2 Views Left  04/19/2015   CLINICAL DATA:  Left ankle pain and swelling.  No known trauma.  EXAM: LEFT ANKLE - 2 VIEW  COMPARISON:  None.  FINDINGS: No fracture or dislocation. The alignment and joint spaces are maintained. The ankle mortise is preserved. No erosion or periosteal reaction. Mild soft tissue edema is noted. No radiopaque foreign body.   IMPRESSION: Soft tissue edema without associated osseous abnormality.   Electronically Signed   By: Jeb Levering M.D.   On: 04/19/2015 01:20   Ct Angio Chest Pe W/cm &/or Wo Cm  04/19/2015   CLINICAL DATA:  Dyspnea and palpitations.  EXAM: CT ANGIOGRAPHY CHEST WITH CONTRAST  TECHNIQUE: Multidetector CT imaging of the chest was performed using the standard protocol during bolus administration of intravenous contrast. Multiplanar CT image reconstructions and MIPs were obtained to evaluate the vascular anatomy.  CONTRAST:  142mL OMNIPAQUE IOHEXOL 350 MG/ML SOLN  COMPARISON:  03/16/2015  FINDINGS: Cardiovascular: There is good opacification of the pulmonary arteries. There is no pulmonary embolism. The thoracic aorta is normal in caliber and intact.  Lungs: There is right upper lobe confluent airspace opacity, new. This is superimposed on the chronic centrilobular and paraseptal emphysematous changes as well as the reticulonodular peribronchovascular opacities which are likely associated with the known sarcoidosis. This could represent infectious infiltrate or aspiration.  Central airways: Patent  Effusions: None  Lymphadenopathy: Mild symmetric hilar adenopathy.  Esophagus: Unremarkable  Upper abdomen: No significant abnormality  Musculoskeletal: No significant abnormality  Review of the MIP images confirms the above findings.  IMPRESSION: 1. Negative for pulmonary embolism 2. New right upper lobe airspace opacity which could represent infectious infiltrate in car aspiration superimposed on the chronic changes of sarcoidosis and emphysema.   Electronically Signed   By: Andreas Newport M.D.   On: 04/19/2015 02:46    Microbiology: Recent Results (from the past 240 hour(s))  Blood Culture (routine x 2)     Status: None (Preliminary result)   Collection Time: 04/19/15  1:42 AM  Result Value Ref Range Status   Specimen Description BLOOD LEFT HAND  Final   Special Requests BOTTLES DRAWN AEROBIC AND  ANAEROBIC 5  CC  Final   Culture NO GROWTH 4 DAYS  Final   Report Status PENDING  Incomplete  Blood Culture (routine x 2)     Status: None (Preliminary result)   Collection Time: 04/19/15  1:53 AM  Result Value Ref Range Status   Specimen Description BLOOD RIGHT HAND  Final   Special Requests BOTTLES DRAWN AEROBIC AND ANAEROBIC 5 CC  Final   Culture NO GROWTH 4 DAYS  Final   Report Status PENDING  Incomplete  Urine culture     Status: None   Collection Time: 04/19/15  2:18 AM  Result Value Ref Range Status   Specimen Description URINE, RANDOM  Final   Special Requests NONE  Final   Culture >=100,000 COLONIES/mL ESCHERICHIA COLI  Final   Report Status 04/21/2015 FINAL  Final   Organism ID, Bacteria ESCHERICHIA COLI  Final      Susceptibility   Escherichia coli - MIC*    AMPICILLIN >=32 RESISTANT Resistant     CEFAZOLIN <=4 SENSITIVE Sensitive     CEFTRIAXONE <=1 SENSITIVE Sensitive     CIPROFLOXACIN >=4 RESISTANT Resistant     GENTAMICIN <=1 SENSITIVE Sensitive     IMIPENEM <=0.25 SENSITIVE Sensitive     NITROFURANTOIN <=16 SENSITIVE Sensitive     TRIMETH/SULFA >=320 RESISTANT Resistant     AMPICILLIN/SULBACTAM >=32 RESISTANT Resistant     PIP/TAZO <=4 SENSITIVE Sensitive     * >=100,000 COLONIES/mL ESCHERICHIA COLI     Labs: Basic Metabolic Panel:  Recent Labs Lab 04/19/15 0144 04/19/15 0728 04/20/15 0438 04/21/15 0504 04/22/15 0416 04/23/15 0306  NA  --  129* 134* 133* 133* 131*  K  --  4.3 3.9 3.8 3.8 4.1  CL  --  94* 95* 93* 91* 88*  CO2  --  24 28 30 29  32  GLUCOSE  --  404* 329* 289* 230* 302*  BUN  --  12 17 17 14 15   CREATININE  --  1.08* 1.09* 1.02* 0.95 1.18*  CALCIUM  --  8.3* 8.6* 8.4* 9.0 9.4  MG 1.5*  --   --   --  2.0  --    Liver Function Tests:  Recent Labs Lab 04/18/15 2135 04/19/15 0728 04/21/15 0504  AST 34 28 21  ALT 60* 54 47  ALKPHOS 96 93 83  BILITOT 1.2 1.6* 0.9  PROT 7.3 7.2 6.8  ALBUMIN 3.3* 3.3* 3.1*   No results for  input(s): LIPASE, AMYLASE in the last 168 hours. No results for input(s): AMMONIA in the last 168 hours. CBC:  Recent Labs Lab 04/18/15 2135 04/19/15 0728 04/20/15 0438 04/21/15 0504 04/22/15 0416 04/23/15 0306  WBC 8.6 8.1 10.5 8.5 10.3 10.4  NEUTROABS 6.5 7.4  --   --   --   --   HGB 13.2 12.9 12.0 11.9* 12.7 12.9  HCT 40.2 39.0 37.4 37.5 39.4 40.4  MCV 77.5* 77.5* 78.4 78.9 78.5 79.1  PLT 179 151 171 177 201 219   Cardiac Enzymes: No results for input(s): CKTOTAL, CKMB, CKMBINDEX, TROPONINI in the last 168 hours. BNP: BNP (last 3 results)  Recent Labs  12/18/14 1846 03/19/15 0441 04/18/15 2135  BNP 231.0* 61.9 15.1    ProBNP (last 3 results)  Recent Labs  07/20/14 1817  PROBNP 39.7    CBG:  Recent Labs Lab 04/22/15 1627 04/22/15 1806 04/22/15 2113 04/23/15 0731 04/23/15 1124  GLUCAP 441* 484* 264* 354* 233*       Signed:  Swayzie Choate MD [facility] 04/23/2015, 4:49 PM
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
## 2015-04-23 NOTE — Discharge Instructions (Signed)
Cough, Adult   A cough is a reflex. It helps you clear your throat and airways. A cough can help heal your body. A cough can last 2 or 3 weeks (acute) or may last more than 8 weeks (chronic). Some common causes of a cough can include an infection, allergy, or a cold.  HOME CARE  · Only take medicine as told by your doctor.  · If given, take your medicines (antibiotics) as told. Finish them even if you start to feel better.  · Use a cold steam vaporizer or humidifier in your home. This can help loosen thick spit (secretions).  · Sleep so you are almost sitting up (semi-upright). Use pillows to do this. This helps reduce coughing.  · Rest as needed.  · Stop smoking if you smoke.  GET HELP RIGHT AWAY IF:  · You have yellowish-white fluid (pus) in your thick spit.  · Your cough gets worse.  · Your medicine does not reduce coughing, and you are losing sleep.  · You cough up blood.  · You have trouble breathing.  · Your pain gets worse and medicine does not help.  · You have a fever.  MAKE SURE YOU:   · Understand these instructions.  · Will watch your condition.  · Will get help right away if you are not doing well or get worse.  Document Released: 05/25/2011 Document Revised: 01/26/2014 Document Reviewed: 05/25/2011  Exit[employer]® Patient Information ©2015 [employer]. This information is not intended to replace advice given to you by your health care provider. Make sure you discuss any questions you have with your health care provider.

## 2015-04-23 NOTE — Progress Notes (Addendum)
Ambulated independently to RN station, around semi circle, and back to room (about 223ft total).  HR up to 110's briefly.  At the end of walk, pt stated she felt dizzy and felt her heart fluttering and c/o SOB.  SpO2 down to 82%.  Pt remained on 4L O2 entire time (is on cont 4LO2 at home).  SpO2 up to 98% after 2 minutes at rest.  Will continue to monitor.

## 2015-04-24 LAB — CULTURE, BLOOD (ROUTINE X 2)
CULTURE: NO GROWTH
CULTURE: NO GROWTH

## 2015-04-26 ENCOUNTER — Other Ambulatory Visit: Payer: Self-pay

## 2015-04-26 ENCOUNTER — Encounter: Payer: Self-pay | Admitting: Internal Medicine

## 2015-04-26 ENCOUNTER — Other Ambulatory Visit: Payer: Self-pay | Admitting: Internal Medicine

## 2015-04-26 DIAGNOSIS — M549 Dorsalgia, unspecified: Secondary | ICD-10-CM | POA: Diagnosis not present

## 2015-04-26 DIAGNOSIS — I5032 Chronic diastolic (congestive) heart failure: Secondary | ICD-10-CM | POA: Diagnosis not present

## 2015-04-26 DIAGNOSIS — G8929 Other chronic pain: Secondary | ICD-10-CM | POA: Diagnosis not present

## 2015-04-26 DIAGNOSIS — J441 Chronic obstructive pulmonary disease with (acute) exacerbation: Secondary | ICD-10-CM | POA: Diagnosis not present

## 2015-04-26 DIAGNOSIS — J9611 Chronic respiratory failure with hypoxia: Secondary | ICD-10-CM | POA: Diagnosis not present

## 2015-04-26 DIAGNOSIS — G4733 Obstructive sleep apnea (adult) (pediatric): Secondary | ICD-10-CM | POA: Diagnosis not present

## 2015-04-26 DIAGNOSIS — D860 Sarcoidosis of lung: Secondary | ICD-10-CM | POA: Diagnosis not present

## 2015-04-26 DIAGNOSIS — F419 Anxiety disorder, unspecified: Secondary | ICD-10-CM | POA: Diagnosis not present

## 2015-04-26 DIAGNOSIS — I10 Essential (primary) hypertension: Secondary | ICD-10-CM | POA: Diagnosis not present

## 2015-04-26 DIAGNOSIS — J45909 Unspecified asthma, uncomplicated: Secondary | ICD-10-CM | POA: Diagnosis not present

## 2015-04-26 DIAGNOSIS — I272 Other secondary pulmonary hypertension: Secondary | ICD-10-CM | POA: Diagnosis not present

## 2015-04-26 DIAGNOSIS — E662 Morbid (severe) obesity with alveolar hypoventilation: Secondary | ICD-10-CM | POA: Diagnosis not present

## 2015-04-26 DIAGNOSIS — E119 Type 2 diabetes mellitus without complications: Secondary | ICD-10-CM | POA: Diagnosis not present

## 2015-04-26 LAB — GLUCOSE, CAPILLARY: Glucose-Capillary: >600 mg/dL (ref 65–99)

## 2015-04-26 MED ORDER — CETIRIZINE HCL 1 MG/ML PO SYRP: 10.0000 mg | ORAL_SOLUTION | Freq: Every day | ORAL | Status: DC

## 2015-04-26 NOTE — Patient Outreach (Signed)
This RNCM was successful in making telephone contact with patient for transition of care and assess for community care coordination needs.  Patient identified himself using HIPPA identifiers and readily responded to orientation questions. Patient stated she was feeling much better after her 7/29 discharge. Patient stated continued interest in outpatient mental health counseling, stated she was told the waiting list for appointments is 3 weeks.  Referral made to [facility].   COPD: Patient has medications-in halers, nebulizers, states she signed paperwork to have pulmonary hypertension medication coverage.  Depression: Antidepressants ordered, outpatient mental health referral made to [facility] for [place].  ADL/IADL deficit: In home aide service in place to assist

## 2015-04-26 NOTE — Patient Outreach (Signed)
[facility]) Care Management  04/26/2015  [name] April 28, 1966 [id number]   RED on COPD Dashboard for Friday April 23, 2015, notified Erenest Rasher, RN to outreach patient.  [name]. [place], [place] Management [place] Assistant Phone: [phone] Fax: [phone]

## 2015-04-27 ENCOUNTER — Other Ambulatory Visit (HOSPITAL_COMMUNITY): Payer: Self-pay | Admitting: Cardiology

## 2015-04-27 ENCOUNTER — Encounter (HOSPITAL_COMMUNITY): Payer: Self-pay

## 2015-04-27 ENCOUNTER — Ambulatory Visit (HOSPITAL_COMMUNITY)
Admit: 2015-04-27 | Discharge: 2015-04-27 | Disposition: A | Discharge status: Home / Self Care | Payer: Medicare Other | Source: Ambulatory Visit | Attending: Cardiology | Admitting: Cardiology

## 2015-04-27 ENCOUNTER — Inpatient Hospital Stay: Payer: Medicare Other | Admitting: Internal Medicine

## 2015-04-27 VITALS — BP 126/72 | HR 102 | Wt 295.8 lb

## 2015-04-27 DIAGNOSIS — J9611 Chronic respiratory failure with hypoxia: Secondary | ICD-10-CM | POA: Diagnosis not present

## 2015-04-27 DIAGNOSIS — E785 Hyperlipidemia, unspecified: Secondary | ICD-10-CM | POA: Diagnosis not present

## 2015-04-27 DIAGNOSIS — Z91040 Latex allergy status: Secondary | ICD-10-CM | POA: Insufficient documentation

## 2015-04-27 DIAGNOSIS — G4733 Obstructive sleep apnea (adult) (pediatric): Secondary | ICD-10-CM | POA: Diagnosis not present

## 2015-04-27 DIAGNOSIS — I270 Primary pulmonary hypertension: Secondary | ICD-10-CM | POA: Diagnosis not present

## 2015-04-27 DIAGNOSIS — Z885 Allergy status to narcotic agent status: Secondary | ICD-10-CM | POA: Insufficient documentation

## 2015-04-27 DIAGNOSIS — I50810 Right heart failure, unspecified: Secondary | ICD-10-CM

## 2015-04-27 DIAGNOSIS — Z8249 Family history of ischemic heart disease and other diseases of the circulatory system: Secondary | ICD-10-CM | POA: Insufficient documentation

## 2015-04-27 DIAGNOSIS — E662 Morbid (severe) obesity with alveolar hypoventilation: Secondary | ICD-10-CM

## 2015-04-27 DIAGNOSIS — Z833 Family history of diabetes mellitus: Secondary | ICD-10-CM | POA: Insufficient documentation

## 2015-04-27 DIAGNOSIS — Z794 Long term (current) use of insulin: Secondary | ICD-10-CM | POA: Diagnosis not present

## 2015-04-27 DIAGNOSIS — Z882 Allergy status to sulfonamides status: Secondary | ICD-10-CM | POA: Insufficient documentation

## 2015-04-27 DIAGNOSIS — I5032 Chronic diastolic (congestive) heart failure: Secondary | ICD-10-CM

## 2015-04-27 DIAGNOSIS — N189 Chronic kidney disease, unspecified: Secondary | ICD-10-CM | POA: Insufficient documentation

## 2015-04-27 DIAGNOSIS — I5033 Acute on chronic diastolic (congestive) heart failure: Secondary | ICD-10-CM | POA: Insufficient documentation

## 2015-04-27 DIAGNOSIS — Z87891 Personal history of nicotine dependence: Secondary | ICD-10-CM | POA: Diagnosis not present

## 2015-04-27 DIAGNOSIS — K219 Gastro-esophageal reflux disease without esophagitis: Secondary | ICD-10-CM | POA: Insufficient documentation

## 2015-04-27 DIAGNOSIS — I272 Other secondary pulmonary hypertension: Secondary | ICD-10-CM | POA: Diagnosis not present

## 2015-04-27 DIAGNOSIS — Z7982 Long term (current) use of aspirin: Secondary | ICD-10-CM | POA: Insufficient documentation

## 2015-04-27 DIAGNOSIS — E669 Obesity, unspecified: Secondary | ICD-10-CM | POA: Insufficient documentation

## 2015-04-27 DIAGNOSIS — I509 Heart failure, unspecified: Secondary | ICD-10-CM

## 2015-04-27 DIAGNOSIS — D869 Sarcoidosis, unspecified: Secondary | ICD-10-CM | POA: Diagnosis not present

## 2015-04-27 DIAGNOSIS — J449 Chronic obstructive pulmonary disease, unspecified: Secondary | ICD-10-CM | POA: Diagnosis not present

## 2015-04-27 DIAGNOSIS — I129 Hypertensive chronic kidney disease with stage 1 through stage 4 chronic kidney disease, or unspecified chronic kidney disease: Secondary | ICD-10-CM | POA: Insufficient documentation

## 2015-04-27 DIAGNOSIS — D860 Sarcoidosis of lung: Secondary | ICD-10-CM | POA: Insufficient documentation

## 2015-04-27 DIAGNOSIS — Z79899 Other long term (current) drug therapy: Secondary | ICD-10-CM | POA: Insufficient documentation

## 2015-04-27 LAB — BASIC METABOLIC PANEL
Anion gap: 12 (ref 5–15)
BUN: 17 mg/dL (ref 6–20)
CALCIUM: 9.8 mg/dL (ref 8.9–10.3)
CO2: 32 mmol/L (ref 22–32)
Chloride: 93 mmol/L — ABNORMAL LOW (ref 101–111)
Creatinine, Ser: 1.16 mg/dL — ABNORMAL HIGH (ref 0.44–1.00)
GFR calc non Af Amer: 55 mL/min — ABNORMAL LOW (ref >60)
Glucose, Bld: 197 mg/dL — ABNORMAL HIGH (ref 65–99)
Potassium: 3.8 mmol/L (ref 3.5–5.1)
SODIUM: 137 mmol/L (ref 135–145)

## 2015-04-27 MED ORDER — METOLAZONE 2.5 MG PO TABS: 2.5000 mg | ORAL_TABLET | ORAL | Status: DC

## 2015-04-27 MED ORDER — FUROSEMIDE 10 MG/ML IJ SOLN
80.0000 mg | Freq: Once | INTRAMUSCULAR | Status: AC
  Administered 2015-04-27: 80 mg via INTRAVENOUS

## 2015-04-27 MED ORDER — POTASSIUM CHLORIDE CRYS ER 20 MEQ PO TBCR: 40.0000 meq | EXTENDED_RELEASE_TABLET | Freq: Two times a day (BID) | ORAL | Status: DC

## 2015-04-27 NOTE — Progress Notes (Signed)
[name], female   DOB: 1965/12/20, 49 y.o.   MRN: [id number]  Primary Care: Asa Lente Primary Cardiologist: Aundra Dubin  HPI:  [name] is a 49 y.o. with a PMH of OHS/OSA on Cpap and home oxygen, chronic diastolic CHF with prominent RV dysfunction, pulmonary hypertension, Fibromyalgia, Pulmonary sarcoidosis, HTN, dyslipidemia, and COPD. She was hospitalized for acute on chronic diastolic CHF had RHC in 04/7563 (see below) with plans to place her on ERA (macitentan) after left heart filling pressure optimized as there is some evidence for ERA use in sarcoidosis-related PAH.   She was recently admitted on 04/18/15 for SOB and palpitations.  Pt was placed on IV lasix with -19 L of output during that admission.  CT of the chest showed new infiltrate along with fever and she was also treated empirically for HCAP. Her weight on discharge was 292 lbs.  She reports today for post hospital HF and PAH follow up.  She is up 3 lbs from her discharge at home.  She says her breathing is about the same as when she came into the hospital, with 6 l 02 with activity, and 4 l at rest. DOE with minimal exertion.  Doesn't feel like she is swelling up.  Says she still does not have macitentan, has re-signed prior authorization papers.  Says she has been taking her medications as directed, but has been forgetting her baby ASA.  Is having numbness and tingling down into her left hand. Denies CP.  Chronic orthopnea, sleeps in recliner. She says her energy is good, but still not quite to the level that she would like to be. Says she is taking in a lot of fluid, is limiting her salt. Says she currently is drinking at least a gallon a day.  Labs 7/16 K 4.1, Creatinine 1.18, HCT 40.4  PMH: 1. Chronic Diastolic CHF: RHC 3/32/95 with RA mean 16; PA 70/32, mean 47; PCWP mean 20; Cardiac Index (Fick) 2.74 PVR 4.1 WU; Cardiac Index (Thermo) 2.07; PVR 5.5 WU.  Echo (3/16) with EF 65-70%, D-shaped  interventricular septum, moderately dilated RV with severely reduced systolic function, PASP 188 mmHg.  2. Pulmonary arterial HTN: See RHC above.  RV dysfunction as per echo above. V/Q scan in 3/16 concerning for COPD versus chronic PE, Repeat CTAs negative for PE. There is definitely a contribution from COPD and OHS/OSA (group 3). However, there could be a contribution from sarcoidosis. 3. Pulmonary Sarcoidosis: Last CT in 7/16 showed chronic centrilobular and paraseptal emphysematous changes as well as the reticulonodular peribronchovascular opacities which are likely associated with the known sarcoidosis. 4. HTN 5. Dyslipidemia 6. COPD 7. OHS/OSA: Home oxygen and CPAP.  8. GERD 9. Obesity 10. CKD  Review of Systems: All systems reviewed and negative except as per HPI.   Current Outpatient Prescriptions  Medication Sig Dispense Refill  . albuterol (PROVENTIL) (2.5 MG/3ML) 0.083% nebulizer solution INHALE CONTENTS OF 1 VIAL VIA NEBULIZER EVERY 6 HOURS AS NEEDED FOR WHEEZING OR SHORTNESS OF BREATH 1125 mL 0  . ALPRAZolam (XANAX) 1 MG tablet Take 1 tablet (1 mg total) by mouth 3 (three) times daily. 90 tablet 3  . aspirin EC 81 MG tablet Take 81 mg by mouth daily.    Marland Kitchen BIOTIN PO Take 1 capsule by mouth daily.    Marland Kitchen buPROPion (WELLBUTRIN XL) 150 MG 24 hr tablet Take 1 tablet (150 mg total) by mouth daily. 30 tablet 2  . cetirizine (ZYRTEC) 1 MG/ML syrup Take 10 mLs (  10 mg total) by mouth at bedtime. 300 mL 0  . diclofenac sodium (VOLTAREN) 1 % GEL Apply 4 g topically 4 (four) times daily. 100 g 0  . esomeprazole (NEXIUM) 40 MG capsule Take 40 mg by mouth 2 (two) times daily before a meal.    . fluticasone (FLONASE) 50 MCG/ACT nasal spray Place 2 sprays into both nostrils daily. 16 g 5  . gabapentin (NEURONTIN) 300 MG capsule Take 300 mg by mouth daily as needed (neuropathy pain).     Marland Kitchen glimepiride (AMARYL) 1 MG tablet Take 1 mg by mouth daily with breakfast.    . hydrOXYzine  (ATARAX/VISTARIL) 10 MG tablet TAKE 1 TABLET BY MOUTH THREE TIMES DAILY AS NEEDED FOR ITCHING 30 tablet 11  . insulin aspart (NOVOLOG FLEXPEN) 100 UNIT/ML FlexPen Inject 5 Units into the skin 3 (three) times daily with meals. (Patient taking differently: Inject 6-10 Units into the skin 3 (three) times daily with meals. ) 21 mL 0  . Insulin Glargine (LANTUS SOLOSTAR) 100 UNIT/ML Solostar Pen Inject 72 Units into the skin 2 (two) times daily. 15 mL 2  . Multiple Vitamin (MULTIVITAMIN) capsule Take 1 capsule by mouth once a week. Mondays    . omega-3 fish oil (MAXEPA) 1000 MG CAPS capsule Take 1 capsule by mouth daily.    . phenazopyridine (PYRIDIUM) 200 MG tablet Take 1 tablet (200 mg total) by mouth 3 (three) times daily as needed for pain. 20 tablet 0  . potassium chloride SA (K-DUR,KLOR-CON) 20 MEQ tablet Take 2 tablets (40 mEq total) by mouth 2 (two) times daily. 180 tablet 11  . predniSONE (DELTASONE) 10 MG tablet Take 2 tablets (20 mg total) by mouth daily with breakfast. Take 2 tablets (20mg ) daily x 4 days, then 1 tablet (10mg ) daily x 3 days then stop. 12 tablet 0  . PROAIR HFA 108 (90 BASE) MCG/ACT inhaler INHALE 2 PUFFS BY MOUTH EVERY 6 HOURS AS NEEDED FOR WHEEZING OR SHORTNESS OF BREATH 8.5 g 0  . spironolactone (ALDACTONE) 25 MG tablet Take 1 tablet (25 mg total) by mouth daily. 30 tablet 0  . tiZANidine (ZANAFLEX) 2 MG tablet Take 2 mg by mouth 2 (two) times daily as needed for muscle spasms.   3  . torsemide (DEMADEX) 20 MG tablet Take 3 tablets (60 mg total) by mouth 2 (two) times daily. 180 tablet 0  . varenicline (CHANTIX) 0.5 MG tablet Take 1 tablet (0.5 mg total) by mouth 2 (two) times daily with a meal.    . Macitentan 10 MG TABS Take 10 mg by mouth daily. (Patient not taking: Reported on 04/16/2015) 30 tablet 1   No current facility-administered medications for this encounter.    Allergies  Allergen Reactions  . Simvastatin Other (See Comments)    Severe leg pain and burning   . Sulfamethoxazole-Trimethoprim Nausea And Vomiting    Causes Projectile Vomiting  . Zolpidem Tartrate Other (See Comments)    Chest pain  . Azithromycin Other (See Comments)    Resistant to med  . Ceftin [Cefuroxime] Swelling and Other (See Comments)    MOUTH SWELLING  . Doxycycline Other (See Comments)    Resistant to med  . Latex Itching and Rash    Also burning sensations  . Metformin And Related Diarrhea    Severe diarrhea  . Metolazone Other (See Comments)    MYALGIAS  . Metronidazole Nausea And Vomiting  . Morphine-Naltrexone Palpitations  . Ciprofloxacin Nausea Only  . Tramadol Itching and Nausea  Only    History   Social History  . Marital Status: Legally Separated    Spouse Name: N/A  . Number of Children: N/A  . Years of Education: N/A   Occupational History  . Not on file.   Social History Main Topics  . Smoking status: Former Smoker -- 0.25 packs/day for 29 years    Types: Cigarettes    Quit date: 11/24/2014  . Smokeless tobacco: Never Used  . Alcohol Use: 0.0 oz/week    0 Standard drinks or equivalent per week     Comment: occasionally  . Drug Use: Yes    Special: Cocaine, Marijuana     Comment: remote cocaine; current marijuana  . Sexual Activity: Not on file   Other Topics Concern  . Not on file   Social History Narrative   Pt is single. No children. On disability   Family History  Problem Relation Age of Onset  . Hypertension Mother   . Heart disease Mother   . Emphysema Father   . Hypertension Father   . Stomach cancer Father   . Heart disease Father   . Allergies Brother   . Stomach cancer Brother   . Congestive Heart Failure Brother   . Diabetes type II Brother   . Heart disease Sister   . Diabetes type II Sister   . Heart attack Sister   . Stroke Sister   . Hypertension Sister   . Heart failure Sister     diastolic  . Hypertension Sister     Danley Danker Vitals:   04/27/15 1037  BP: 126/72  Pulse: 102  Weight: 295 lb 12 oz  (134.151 kg)  SpO2: 98%    PHYSICAL EXAM: General:  Well appearing. On 02 via [place]. Obese.  HEENT: normal Neck: supple. Thick, JVP 12 cm + HJR. Carotids 2+ bilat; no bruits. No lymphadenopathy or thryomegaly appreciated. Cor: PMI nondisplaced. Regular rate & rhythm. No rubs, gallops or murmurs. Lungs: Diminished throughout. Abdomen: Obese, soft, nontender, nondistended. No hepatosplenomegaly. No bruits or masses. Good bowel sounds. Extremities: no cyanosis, clubbing, rash. Trace peripheral edema bilaterally. Neuro: alert & oriented x 3, cranial nerves grossly intact. moves all 4 extremities w/o difficulty. Affect pleasant.  ASSESSMENT & PLAN: 1. 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 She has quit smoking on Chantix. She has OHS/OSA on oxygen during the day and CPAP at night. She also has biopsy-proven sarcoidosis. She has had some changes on chest CT indicative of possible sarcoidosis progression, but per last pulmonary note this does not seem to be her primary problem at this time. She has not been on steroids for sarcoidosis chronically. V/Q scan in 3/16 concerning for COPD versus chronic PE, but she has had several chest CTAs that are not indicative of acute or chronic PE.  - Continue oxygen and CPAP for OHS/OSA.  2. Pulmonary hypertension: Multiple possible reasons for pulmonary hypertension. PA systolic pressure estimate 106 mmHg by last echo. [place] 6/16 showed severe pulmonary arterial hypertension with some pulmonary venous hypertension => RA 16, PA 70/32 mean 46, PCWP 20, PVR 5.5. There is definitely a contribution from COPD and OHS/OSA (group 3). However, there could be a contribution from the co-existing pulmonary sarcoidosis.   - I plan to start her on ERA (macitentan) after left heart filling pressure optimized as there is some evidence for ERA use in sarcoidosis-related PAH. Still waiting to get the medication, will diurese her more  aggressively prior to starting it.  3. Acute on chronic diastolic CHF with prominent RV dysfunction: Severely reduced RV function on echo. R>L heart failure on cath in 6/16.Her weight is beginning to increase again, and she is volume overloaded on exam.   - Continue torsemide 60 mg po bid - She will get Lasix 80 mg IV x 1 in clinic today.  She will take metolazone 2.5 mg 30 minutes prior to torsemide on Wednesday, Thursday, and Saturday this week.  After this week, she will take metolazone every Wednesday and Saturday.  She will take an extra KCl 40 on metolazone days.   - BMET/BNP today and repeat in 2 wks.  4. HTN: Stable currently. 5. Dyslipidemia 6. COPD: No longer smoking.   Legrand Como [address]" [place], PA-C 04/27/2015 11:04 AM   Patient seen with PA, agree with the above note.  She needs increased diuresis as outlined above for worsening volume overload.  She will need to start macitentan after further diuresis (drug has been approved but not yet sent to her).   Loralie Champagne 04/27/2015

## 2015-04-27 NOTE — Progress Notes (Signed)
Pt had 800 cc of urine output

## 2015-04-27 NOTE — Progress Notes (Signed)
24 gauge IV started in pt's left hand, 80 mg IV Lasix given, pt tolerated well, IV d/c'd catheter intact

## 2015-04-27 NOTE — Patient Instructions (Signed)
Take an extra 40 meq (2 tabs) of Potassium TODAY  Start Metolazone 2.5 mg take 1 tab Wed, Thur and Sat THIS WEEK ONLY, THEN Take 1 tab every Wednesday and Saturday  When you take Metolazone take an extra 40 meq (2 tabs) of Potassium  Labs in 1 week  Your physician recommends that you schedule a follow-up appointment in: 2 weeks

## 2015-04-28 ENCOUNTER — Telehealth: Payer: Self-pay | Admitting: Internal Medicine

## 2015-04-28 DIAGNOSIS — M79606 Pain in leg, unspecified: Secondary | ICD-10-CM | POA: Diagnosis not present

## 2015-04-28 DIAGNOSIS — M47817 Spondylosis without myelopathy or radiculopathy, lumbosacral region: Secondary | ICD-10-CM | POA: Diagnosis not present

## 2015-04-28 DIAGNOSIS — G894 Chronic pain syndrome: Secondary | ICD-10-CM | POA: Diagnosis not present

## 2015-04-28 DIAGNOSIS — M545 Low back pain: Secondary | ICD-10-CM | POA: Diagnosis not present

## 2015-04-28 DIAGNOSIS — Z79899 Other long term (current) drug therapy: Secondary | ICD-10-CM | POA: Diagnosis not present

## 2015-04-28 DIAGNOSIS — M488X6 Other specified spondylopathies, lumbar region: Secondary | ICD-10-CM | POA: Diagnosis not present

## 2015-04-28 NOTE — Telephone Encounter (Signed)
Noted! Thank you

## 2015-04-28 NOTE — Progress Notes (Signed)
Pt aware of lab results.  Contacted via phone

## 2015-04-28 NOTE — Telephone Encounter (Signed)
[name] from [facility] wanted to let you know patient was discharged from hospital on 7/29. He was admitted with shortness of breath.

## 2015-04-29 ENCOUNTER — Encounter: Payer: Self-pay | Admitting: Internal Medicine

## 2015-04-29 ENCOUNTER — Ambulatory Visit (INDEPENDENT_AMBULATORY_CARE_PROVIDER_SITE_OTHER): Payer: Medicare Other | Admitting: Internal Medicine

## 2015-04-29 VITALS — BP 132/82 | HR 101 | Temp 97.8°F | Ht 68.0 in | Wt 287.0 lb

## 2015-04-29 DIAGNOSIS — N390 Urinary tract infection, site not specified: Secondary | ICD-10-CM

## 2015-04-29 DIAGNOSIS — I10 Essential (primary) hypertension: Secondary | ICD-10-CM

## 2015-04-29 DIAGNOSIS — IMO0002 Reserved for concepts with insufficient information to code with codable children: Secondary | ICD-10-CM

## 2015-04-29 DIAGNOSIS — J441 Chronic obstructive pulmonary disease with (acute) exacerbation: Secondary | ICD-10-CM

## 2015-04-29 DIAGNOSIS — E1165 Type 2 diabetes mellitus with hyperglycemia: Secondary | ICD-10-CM

## 2015-04-29 DIAGNOSIS — B9620 Unspecified Escherichia coli [E. coli] as the cause of diseases classified elsewhere: Secondary | ICD-10-CM

## 2015-04-29 MED ORDER — CEPHALEXIN 500 MG PO CAPS: 500.0000 mg | ORAL_CAPSULE | Freq: Four times a day (QID) | ORAL | Status: DC

## 2015-04-29 NOTE — Progress Notes (Signed)
Pre visit review using our clinic review tool, if applicable. No additional management support is needed unless otherwise documented below in the visit note. 

## 2015-04-29 NOTE — Assessment & Plan Note (Signed)
stable overall by history and exam, recent data reviewed with pt, and pt to continue medical treatment as before,  to f/u any worsening symptoms or concerns BP Readings from Last 3 Encounters:  04/29/15 132/82  04/27/15 126/72  04/23/15 133/71

## 2015-04-29 NOTE — Assessment & Plan Note (Signed)
Resolved, cont current tx,  to f/u any worsening symptoms or concerns SpO2 Readings from Last 3 Encounters:  04/29/15 97%  04/27/15 98%  04/23/15 98%

## 2015-04-29 NOTE — Patient Instructions (Signed)
Please take all new medication as prescribed - the antibiotic ? ?Please continue all other medications as before, and refills have been done if requested. ? ?Please have the pharmacy call with any other refills you may need. ? ?Please continue your efforts at being more active, low cholesterol diet, and weight control. ? ?You are otherwise up to date with prevention measures today. ? ?Please keep your appointments with your specialists as you may have planned ? ? ? ?

## 2015-04-29 NOTE — Progress Notes (Signed)
Subjective:    [name], female    DOB: 08/21/1966, 49 y.o.   MRN: [id number]  HPI  Here to f/u post hospn, Denies urinary symptoms such as dysuria, frequency, urgency, flank pain, hematuria or n/v, fever, chills, but has still some lower abd pain, dysuria, and cloudy urine, pt feels no different with limited tx done for this in hosp, urine cx 7/25 with e coli > 100k resist to augmentin, cipro, and septra,  sens to cephalexin. No antibx for home.  Pt denies chest pain, increased sob or doe, wheezing, orthopnea, PND, increased LE swelling, palpitations, dizziness or syncope.  Pt denies polydipsia, polyuria,   CBG's in 200's.   Has seen card for CHF already - has new metolazone, states gives her leg cramps occas Past Medical History  Diagnosis Date  . Hypertension   . Hyperlipidemia   . Chronic headache   . Fibromyalgia     daily narcotics  . Anxiety     hx chronic BZ use, stopped 07/2010  . Anemia   . Pulmonary sarcoidosis     unimpressive CT chest 2011  . Colonic polyp   . GERD (gastroesophageal reflux disease)   . ALLERGIC RHINITIS   . Asthma   . CHF (congestive heart failure)     Diastolic with fluid overload, May, 2012, LVEF 60%  . Morbid obesity   . Depression   . Panic attacks   . Diabetes mellitus   . COPD (chronic obstructive pulmonary disease)     on home O2, moderate airflow obstruction, suspect d/t emphysema  . Obesity   . Elevated LFTs 09/2011  . Ovarian cyst   . Chronic back pain   . Sleep apnea      CPAP  . Fatty liver   . Esophagitis   . Gastritis   . Internal hemorrhoids   . Adenomatous colon polyp 12/03/09    [facility] in [place], [place]  . Hypoxia 03/2015  . Shortness of breath dyspnea    Past Surgical History  Procedure Laterality Date  . Polypectomy  2011  . Lumbar microdiscectomy  07/06/2011    R L4-5, stern  . Back surgery    . Carpal tunnel release    . Steroid spinal injections    . Hysteroscopy w/d&c N/A 03/25/2013   Procedure: DILATATION AND CURETTAGE /HYSTEROSCOPY;  Surgeon: [name], MD;  Location: [facility];  Service: Gynecology;  Laterality: N/A;  . Dilation and curettage of uterus    . Cardiac catheterization N/A 03/17/2015    Procedure: Right Heart Cath;  Surgeon: Larey Dresser, MD;  Location: [facility];  Service: Cardiovascular;  Laterality: N/A;    reports that she quit smoking about 5 months ago. Her smoking use included Cigarettes. She has a 7.25 pack-year smoking history. She has never used smokeless tobacco. She reports that she drinks alcohol. She reports that she uses illicit drugs (Cocaine and Marijuana). family history includes Allergies in her brother; Congestive Heart Failure in her brother; Diabetes type II in her brother and sister; Emphysema in her father; Heart attack in her sister; Heart disease in her father, mother, and sister; Heart failure in her sister; Hypertension in her father, mother, sister, and sister; Stomach cancer in her brother and father; Stroke in her sister. Allergies  Allergen Reactions  . Simvastatin Other (See Comments)    Severe leg pain and burning  . Sulfamethoxazole-Trimethoprim Nausea And Vomiting    Causes Projectile Vomiting  . Zolpidem  Tartrate Other (See Comments)    Chest pain  . Azithromycin Other (See Comments)    Resistant to med  . Ceftin [Cefuroxime] Swelling and Other (See Comments)    MOUTH SWELLING  . Doxycycline Other (See Comments)    Resistant to med  . Latex Itching and Rash    Also burning sensations  . Metformin And Related Diarrhea    Severe diarrhea  . Metolazone Other (See Comments)    MYALGIAS  . Metronidazole Nausea And Vomiting  . Morphine-Naltrexone Palpitations  . Ciprofloxacin Nausea Only  . Tramadol Itching and Nausea Only   Current Outpatient Prescriptions on File Prior to Visit  Medication Sig Dispense Refill  . albuterol (PROVENTIL) (2.5 MG/3ML) 0.083% nebulizer solution INHALE CONTENTS OF 1 VIAL VIA  NEBULIZER EVERY 6 HOURS AS NEEDED FOR WHEEZING OR SHORTNESS OF BREATH 1125 mL 0  . ALPRAZolam (XANAX) 1 MG tablet Take 1 tablet (1 mg total) by mouth 3 (three) times daily. 90 tablet 3  . aspirin EC 81 MG tablet Take 81 mg by mouth daily.    Marland Kitchen BIOTIN PO Take 1 capsule by mouth daily.    Marland Kitchen buPROPion (WELLBUTRIN XL) 150 MG 24 hr tablet Take 1 tablet (150 mg total) by mouth daily. 30 tablet 2  . cetirizine (ZYRTEC) 1 MG/ML syrup Take 10 mLs (10 mg total) by mouth at bedtime. 300 mL 0  . diclofenac sodium (VOLTAREN) 1 % GEL Apply 4 g topically 4 (four) times daily. 100 g 0  . esomeprazole (NEXIUM) 40 MG capsule Take 40 mg by mouth 2 (two) times daily before a meal.    . fluticasone (FLONASE) 50 MCG/ACT nasal spray Place 2 sprays into both nostrils daily. 16 g 5  . gabapentin (NEURONTIN) 300 MG capsule Take 300 mg by mouth daily as needed (neuropathy pain).     Marland Kitchen glimepiride (AMARYL) 1 MG tablet Take 1 mg by mouth daily with breakfast.    . hydrOXYzine (ATARAX/VISTARIL) 10 MG tablet TAKE 1 TABLET BY MOUTH THREE TIMES DAILY AS NEEDED FOR ITCHING 30 tablet 11  . insulin aspart (NOVOLOG FLEXPEN) 100 UNIT/ML FlexPen Inject 5 Units into the skin 3 (three) times daily with meals. (Patient taking differently: Inject 6-10 Units into the skin 3 (three) times daily with meals. ) 21 mL 0  . Insulin Glargine (LANTUS SOLOSTAR) 100 UNIT/ML Solostar Pen Inject 72 Units into the skin 2 (two) times daily. 15 mL 2  . Macitentan 10 MG TABS Take 10 mg by mouth daily. 30 tablet 1  . metolazone (ZAROXOLYN) 2.5 MG tablet Take 1 tablet (2.5 mg total) by mouth 2 (two) times a week. Every Wednesday and Saturday 15 tablet 3  . Multiple Vitamin (MULTIVITAMIN) capsule Take 1 capsule by mouth once a week. Mondays    . omega-3 fish oil (MAXEPA) 1000 MG CAPS capsule Take 1 capsule by mouth daily.    . phenazopyridine (PYRIDIUM) 200 MG tablet Take 1 tablet (200 mg total) by mouth 3 (three) times daily as needed for pain. 20 tablet 0    . potassium chloride SA (K-DUR,KLOR-CON) 20 MEQ tablet TAKE 2 TABLETS BY MOUTH TWICE DAILY, TAKE AN EXTRA 2 TABLETS WHEN YOU TAKE METOLAZONE 480 tablet 3  . predniSONE (DELTASONE) 10 MG tablet Take 2 tablets (20 mg total) by mouth daily with breakfast. Take 2 tablets (20mg ) daily x 4 days, then 1 tablet (10mg ) daily x 3 days then stop. 12 tablet 0  . PROAIR HFA 108 (90 BASE) MCG/ACT inhaler  INHALE 2 PUFFS BY MOUTH EVERY 6 HOURS AS NEEDED FOR WHEEZING OR SHORTNESS OF BREATH 8.5 g 0  . spironolactone (ALDACTONE) 25 MG tablet Take 1 tablet (25 mg total) by mouth daily. 30 tablet 0  . tiZANidine (ZANAFLEX) 2 MG tablet Take 2 mg by mouth 2 (two) times daily as needed for muscle spasms.   3  . torsemide (DEMADEX) 20 MG tablet Take 3 tablets (60 mg total) by mouth 2 (two) times daily. 180 tablet 0  . varenicline (CHANTIX) 0.5 MG tablet Take 1 tablet (0.5 mg total) by mouth 2 (two) times daily with a meal.     No current facility-administered medications on file prior to visit.   Review of Systems  Constitutional: Negative for unusual diaphoresis or night sweats HENT: Negative for ringing in ear or discharge Eyes: Negative for double vision or worsening visual disturbance.  Respiratory: Negative for choking and stridor.   Gastrointestinal: Negative for vomiting or other signifcant bowel change Genitourinary: Negative for hematuria or change in urine volume.  Musculoskeletal: Negative for other MSK pain or swelling Skin: Negative for color change and worsening wound.  Neurological: Negative for tremors and numbness other than noted  Psychiatric/Behavioral: Negative for decreased concentration or agitation other than above       Objective:   Physical Exam BP 132/82 mmHg  Pulse 101  Temp(Src) 97.8 F (36.6 C) (Oral)  Ht 5\' 8"  (1.727 m)  Wt 287 lb (130.182 kg)  BMI 43.65 kg/m2  SpO2 97%  LMP 10/16/2012 VS noted,  Constitutional: Pt appears in no significant distress HENT: Head: NCAT.   Right Ear: External ear normal.  Left Ear: External ear normal.  Eyes: . Pupils are equal, round, and reactive to light. Conjunctivae and EOM are normal Neck: Normal range of motion. Neck supple.  Cardiovascular: Normal rate and regular rhythm.   Pulmonary/Chest: Effort normal and breath sounds decrsaed without rales or wheezing.  Abd:  Soft, NT, ND, + BS, no flank tender Neurological: Pt is alert. Not confused , motor grossly intact Skin: Skin is warm. No rash, no LE edema Psychiatric: Pt behavior is normal. No agitation.      Assessment & Plan:

## 2015-04-29 NOTE — Assessment & Plan Note (Signed)
stable overall by history and exam, recent data reviewed with pt, and pt to continue medical treatment as before,  to f/u any worsening symptoms or concerns, f/u PCP

## 2015-04-29 NOTE — Assessment & Plan Note (Signed)
By symptoms likely recurrent, declines further lab testing post hospn, will tx with cephalexin course,  to f/u any worsening symptoms or concerns

## 2015-04-30 ENCOUNTER — Other Ambulatory Visit: Payer: Self-pay | Admitting: Internal Medicine

## 2015-04-30 NOTE — Telephone Encounter (Signed)
Thanks

## 2015-04-30 NOTE — Telephone Encounter (Signed)
Please arrange hosp f/u as needed

## 2015-04-30 NOTE — Telephone Encounter (Signed)
FYI: Appt completed with Dr. Jenny Reichmann on 04/29/15.

## 2015-05-03 ENCOUNTER — Telehealth: Payer: Self-pay | Admitting: *Deleted

## 2015-05-03 ENCOUNTER — Other Ambulatory Visit: Payer: Self-pay

## 2015-05-03 DIAGNOSIS — J9611 Chronic respiratory failure with hypoxia: Secondary | ICD-10-CM | POA: Diagnosis not present

## 2015-05-03 DIAGNOSIS — G4733 Obstructive sleep apnea (adult) (pediatric): Secondary | ICD-10-CM | POA: Diagnosis not present

## 2015-05-03 DIAGNOSIS — E119 Type 2 diabetes mellitus without complications: Secondary | ICD-10-CM | POA: Diagnosis not present

## 2015-05-03 DIAGNOSIS — G8929 Other chronic pain: Secondary | ICD-10-CM | POA: Diagnosis not present

## 2015-05-03 DIAGNOSIS — I5032 Chronic diastolic (congestive) heart failure: Secondary | ICD-10-CM | POA: Diagnosis not present

## 2015-05-03 DIAGNOSIS — M549 Dorsalgia, unspecified: Secondary | ICD-10-CM | POA: Diagnosis not present

## 2015-05-03 DIAGNOSIS — D860 Sarcoidosis of lung: Secondary | ICD-10-CM | POA: Diagnosis not present

## 2015-05-03 DIAGNOSIS — I10 Essential (primary) hypertension: Secondary | ICD-10-CM | POA: Diagnosis not present

## 2015-05-03 DIAGNOSIS — I272 Other secondary pulmonary hypertension: Secondary | ICD-10-CM | POA: Diagnosis not present

## 2015-05-03 DIAGNOSIS — E662 Morbid (severe) obesity with alveolar hypoventilation: Secondary | ICD-10-CM | POA: Diagnosis not present

## 2015-05-03 DIAGNOSIS — J441 Chronic obstructive pulmonary disease with (acute) exacerbation: Secondary | ICD-10-CM | POA: Diagnosis not present

## 2015-05-03 DIAGNOSIS — F419 Anxiety disorder, unspecified: Secondary | ICD-10-CM | POA: Diagnosis not present

## 2015-05-03 DIAGNOSIS — J45909 Unspecified asthma, uncomplicated: Secondary | ICD-10-CM | POA: Diagnosis not present

## 2015-05-03 NOTE — Telephone Encounter (Signed)
Left msg on triage wanting to get verbal order PT twice a week for 2 wks, then 1 time a wk for 1 wk to help with activity intolerance & strengthing. Is this ok...[name]

## 2015-05-03 NOTE — Patient Outreach (Signed)
This RNCM was successful in making contact with patient by telephone for transition of care call. Patient identified herself using HIPPA identifiers.  patient responded to week 2 transition of care questions. Patient stated she was real busy today, would like to continue conversation later this week.  Plan: Telephone call later this week to schedule home visit.

## 2015-05-04 ENCOUNTER — Other Ambulatory Visit: Payer: Self-pay

## 2015-05-04 NOTE — Telephone Encounter (Signed)
Left message giving verbal okay for orders.

## 2015-05-04 NOTE — Telephone Encounter (Signed)
I spoke to [facility] to ask to verify the accuracy of the medication that was recorded.   It should have said Diclofenac (NOT diflucan) with Hydrocodone is a level 2 drug interaction.

## 2015-05-04 NOTE — Telephone Encounter (Signed)
I do not see diflucan on her medication list. What is it being used to treat?

## 2015-05-04 NOTE — Telephone Encounter (Signed)
Forwarded to [employer] for advisement on risk vs benefit

## 2015-05-04 NOTE — Telephone Encounter (Signed)
Left message with Tillie Rung that it was okay to give the two medications and that they could be spaced out if there was any concern.

## 2015-05-04 NOTE — Telephone Encounter (Signed)
Level 2 drug interaction between Hydrocodone & Diflucan. Can cause respitory depression and sleepiness. She seems fine. If you want to change please call or If your ok with her taking these together there is no need to call back. Tillie Rung with [facility] [phone]

## 2015-05-04 NOTE — Patient Outreach (Signed)
I called [name] to discuss helping her set up mail order to assist with getting her medications.  She stated she is trying to get a medication, Macitentan, currently from [facility] since [facility] cannot get it for her.  She stated she has been in touch with [facility] and she has an appointment tomorrow with her provider who can continue to assist her in getting this medication.  She would like for me to call her back on Thursday, May 06, 2015 to work on the process for mail order with her other medications. I stated I would call her back then.  Deanne Coffer, PharmD, [place] [phone]

## 2015-05-04 NOTE — Telephone Encounter (Signed)
It is fine to give the medications together - I have checked 2 other sources to confirm. If concerned then space out dosage of medications if needed.

## 2015-05-05 ENCOUNTER — Ambulatory Visit (HOSPITAL_COMMUNITY)
Admission: RE | Admit: 2015-05-05 | Discharge: 2015-05-05 | Disposition: A | Discharge status: Home / Self Care | Payer: Medicare Other | Source: Ambulatory Visit | Attending: Internal Medicine | Admitting: Internal Medicine

## 2015-05-05 DIAGNOSIS — G4733 Obstructive sleep apnea (adult) (pediatric): Secondary | ICD-10-CM | POA: Diagnosis not present

## 2015-05-05 DIAGNOSIS — E662 Morbid (severe) obesity with alveolar hypoventilation: Secondary | ICD-10-CM | POA: Diagnosis not present

## 2015-05-05 DIAGNOSIS — I5022 Chronic systolic (congestive) heart failure: Secondary | ICD-10-CM

## 2015-05-05 DIAGNOSIS — J441 Chronic obstructive pulmonary disease with (acute) exacerbation: Secondary | ICD-10-CM | POA: Diagnosis not present

## 2015-05-05 DIAGNOSIS — G8929 Other chronic pain: Secondary | ICD-10-CM | POA: Diagnosis not present

## 2015-05-05 DIAGNOSIS — I272 Other secondary pulmonary hypertension: Secondary | ICD-10-CM | POA: Diagnosis not present

## 2015-05-05 DIAGNOSIS — F419 Anxiety disorder, unspecified: Secondary | ICD-10-CM | POA: Diagnosis not present

## 2015-05-05 DIAGNOSIS — E119 Type 2 diabetes mellitus without complications: Secondary | ICD-10-CM | POA: Diagnosis not present

## 2015-05-05 DIAGNOSIS — I5032 Chronic diastolic (congestive) heart failure: Secondary | ICD-10-CM | POA: Diagnosis not present

## 2015-05-05 DIAGNOSIS — D860 Sarcoidosis of lung: Secondary | ICD-10-CM | POA: Diagnosis not present

## 2015-05-05 DIAGNOSIS — I10 Essential (primary) hypertension: Secondary | ICD-10-CM | POA: Diagnosis not present

## 2015-05-05 DIAGNOSIS — M549 Dorsalgia, unspecified: Secondary | ICD-10-CM | POA: Diagnosis not present

## 2015-05-05 DIAGNOSIS — J9611 Chronic respiratory failure with hypoxia: Secondary | ICD-10-CM | POA: Diagnosis not present

## 2015-05-05 DIAGNOSIS — I5033 Acute on chronic diastolic (congestive) heart failure: Secondary | ICD-10-CM | POA: Diagnosis not present

## 2015-05-05 DIAGNOSIS — J45909 Unspecified asthma, uncomplicated: Secondary | ICD-10-CM | POA: Diagnosis not present

## 2015-05-05 LAB — BASIC METABOLIC PANEL
ANION GAP: 14 (ref 5–15)
BUN: 17 mg/dL (ref 6–20)
CALCIUM: 9.7 mg/dL (ref 8.9–10.3)
CO2: 29 mmol/L (ref 22–32)
Chloride: 92 mmol/L — ABNORMAL LOW (ref 101–111)
Creatinine, Ser: 1.37 mg/dL — ABNORMAL HIGH (ref 0.44–1.00)
GFR calc Af Amer: 52 mL/min — ABNORMAL LOW (ref >60)
GFR calc non Af Amer: 45 mL/min — ABNORMAL LOW (ref >60)
Glucose, Bld: 151 mg/dL — ABNORMAL HIGH (ref 65–99)
Potassium: 3.4 mmol/L — ABNORMAL LOW (ref 3.5–5.1)
SODIUM: 135 mmol/L (ref 135–145)

## 2015-05-06 NOTE — Telephone Encounter (Signed)
Per [employer] med was shipped 8/10

## 2015-05-07 ENCOUNTER — Other Ambulatory Visit: Payer: Self-pay

## 2015-05-07 NOTE — Patient Outreach (Signed)
I called Ms. Vu to follow up on assisting with setting up mail order.  She stated she would like to get her medications from mail order but did not know how.  I instructed her to call the pharmacy customer service number on the back of her insurance card.  She would be able set up mail order.  I explained they would ask her about her medications, who her provider was and they would need a credit card.  She stated she would call them to set it up.  I asked if she was able to get her macitentan.  She stated she finally got it.  I told her I would be happy to assist in the future if she has issues with the mail order.  I will close the pharmacy program out.    Deanne Coffer, PharmD, [place] [phone]

## 2015-05-10 ENCOUNTER — Other Ambulatory Visit (HOSPITAL_COMMUNITY): Payer: Self-pay | Admitting: *Deleted

## 2015-05-10 MED ORDER — POTASSIUM CHLORIDE CRYS ER 20 MEQ PO TBCR: EXTENDED_RELEASE_TABLET | ORAL | Status: DC

## 2015-05-11 ENCOUNTER — Ambulatory Visit (HOSPITAL_COMMUNITY)
Admission: RE | Admit: 2015-05-11 | Discharge: 2015-05-11 | Disposition: A | Discharge status: Home / Self Care | Payer: Medicare Other | Source: Ambulatory Visit | Attending: Internal Medicine | Admitting: Internal Medicine

## 2015-05-11 VITALS — BP 118/60 | HR 96 | Wt 290.8 lb

## 2015-05-11 DIAGNOSIS — Z91040 Latex allergy status: Secondary | ICD-10-CM | POA: Insufficient documentation

## 2015-05-11 DIAGNOSIS — E119 Type 2 diabetes mellitus without complications: Secondary | ICD-10-CM | POA: Diagnosis not present

## 2015-05-11 DIAGNOSIS — E785 Hyperlipidemia, unspecified: Secondary | ICD-10-CM | POA: Insufficient documentation

## 2015-05-11 DIAGNOSIS — Z7982 Long term (current) use of aspirin: Secondary | ICD-10-CM | POA: Diagnosis not present

## 2015-05-11 DIAGNOSIS — E669 Obesity, unspecified: Secondary | ICD-10-CM | POA: Insufficient documentation

## 2015-05-11 DIAGNOSIS — Z87891 Personal history of nicotine dependence: Secondary | ICD-10-CM | POA: Insufficient documentation

## 2015-05-11 DIAGNOSIS — I270 Primary pulmonary hypertension: Secondary | ICD-10-CM | POA: Diagnosis not present

## 2015-05-11 DIAGNOSIS — M797 Fibromyalgia: Secondary | ICD-10-CM | POA: Diagnosis not present

## 2015-05-11 DIAGNOSIS — Z833 Family history of diabetes mellitus: Secondary | ICD-10-CM | POA: Insufficient documentation

## 2015-05-11 DIAGNOSIS — K219 Gastro-esophageal reflux disease without esophagitis: Secondary | ICD-10-CM | POA: Diagnosis not present

## 2015-05-11 DIAGNOSIS — J9611 Chronic respiratory failure with hypoxia: Secondary | ICD-10-CM | POA: Diagnosis not present

## 2015-05-11 DIAGNOSIS — J449 Chronic obstructive pulmonary disease, unspecified: Secondary | ICD-10-CM | POA: Diagnosis not present

## 2015-05-11 DIAGNOSIS — Z8249 Family history of ischemic heart disease and other diseases of the circulatory system: Secondary | ICD-10-CM | POA: Diagnosis not present

## 2015-05-11 DIAGNOSIS — F419 Anxiety disorder, unspecified: Secondary | ICD-10-CM | POA: Diagnosis not present

## 2015-05-11 DIAGNOSIS — I129 Hypertensive chronic kidney disease with stage 1 through stage 4 chronic kidney disease, or unspecified chronic kidney disease: Secondary | ICD-10-CM | POA: Diagnosis not present

## 2015-05-11 DIAGNOSIS — Z885 Allergy status to narcotic agent status: Secondary | ICD-10-CM | POA: Insufficient documentation

## 2015-05-11 DIAGNOSIS — Z882 Allergy status to sulfonamides status: Secondary | ICD-10-CM | POA: Diagnosis not present

## 2015-05-11 DIAGNOSIS — I5032 Chronic diastolic (congestive) heart failure: Secondary | ICD-10-CM

## 2015-05-11 DIAGNOSIS — I272 Other secondary pulmonary hypertension: Secondary | ICD-10-CM | POA: Diagnosis not present

## 2015-05-11 DIAGNOSIS — G8929 Other chronic pain: Secondary | ICD-10-CM | POA: Diagnosis not present

## 2015-05-11 DIAGNOSIS — G4733 Obstructive sleep apnea (adult) (pediatric): Secondary | ICD-10-CM | POA: Diagnosis not present

## 2015-05-11 DIAGNOSIS — E662 Morbid (severe) obesity with alveolar hypoventilation: Secondary | ICD-10-CM | POA: Diagnosis not present

## 2015-05-11 DIAGNOSIS — M549 Dorsalgia, unspecified: Secondary | ICD-10-CM | POA: Diagnosis not present

## 2015-05-11 DIAGNOSIS — J45909 Unspecified asthma, uncomplicated: Secondary | ICD-10-CM | POA: Diagnosis not present

## 2015-05-11 DIAGNOSIS — N189 Chronic kidney disease, unspecified: Secondary | ICD-10-CM | POA: Diagnosis not present

## 2015-05-11 DIAGNOSIS — I5033 Acute on chronic diastolic (congestive) heart failure: Secondary | ICD-10-CM | POA: Diagnosis not present

## 2015-05-11 DIAGNOSIS — I10 Essential (primary) hypertension: Secondary | ICD-10-CM | POA: Diagnosis not present

## 2015-05-11 DIAGNOSIS — D860 Sarcoidosis of lung: Secondary | ICD-10-CM | POA: Diagnosis not present

## 2015-05-11 DIAGNOSIS — J441 Chronic obstructive pulmonary disease with (acute) exacerbation: Secondary | ICD-10-CM | POA: Diagnosis not present

## 2015-05-11 DIAGNOSIS — Z79899 Other long term (current) drug therapy: Secondary | ICD-10-CM | POA: Diagnosis not present

## 2015-05-11 MED ORDER — MAGNESIUM OXIDE -MG SUPPLEMENT 400 (240 MG) MG PO TABS: 400.0000 mg | ORAL_TABLET | Freq: Every day | ORAL | Status: DC

## 2015-05-11 MED ORDER — METOLAZONE 2.5 MG PO TABS: 2.5000 mg | ORAL_TABLET | ORAL | Status: DC

## 2015-05-11 MED ORDER — POTASSIUM CHLORIDE CRYS ER 20 MEQ PO TBCR: EXTENDED_RELEASE_TABLET | ORAL | Status: DC

## 2015-05-11 NOTE — Patient Instructions (Signed)
We have sent in a prescription for Mag-ox 400 mg daily  Increase Metolazone to 2.5 mg every Mon, Wed and Sat, when you take Metolazone take an extra 40 meq (2 tabs) of Potassium  Start Adcirca 20 mg daily, this is a specialty medication that will come from [facility] in 1 week  Your physician recommends that you schedule a follow-up appointment in: 2 weeks

## 2015-05-11 NOTE — Progress Notes (Signed)
Patient ID: Yvette VIRUET, female   DOB: 17-Feb-1966, 49 y.o.   MRN: [id number] Primary Care: Asa Lente Primary Cardiologist: Aundra Dubin  HPI:  [name] is a 49 y.o. with a PMH of OHS/OSA on Cpap and home oxygen, chronic diastolic CHF with prominent RV dysfunction, pulmonary hypertension, Fibromyalgia, Pulmonary sarcoidosis, HTN, dyslipidemia, and COPD. She was hospitalized for acute on chronic diastolic CHF had RHC in 11/7104 (see below) with plans to place her on ERA (macitentan) after left heart filling pressure optimized as there is some evidence for ERA use in sarcoidosis-related PAH.   She was admitted on 04/18/15 for SOB and palpitations.  Pt was placed on IV lasix with -19 L of output during that admission.  CT of the chest showed new infiltrate along with fever and she was also treated empirically for HCAP. Her weight on discharge was 292 lbs.  At last appointment, she was very short of breath and got IV Lasix.  Diuretics were increased. Weight is down 5 lbs today.  She is still limited but is doing better.  Able to walk into office today with one stop.  No dyspnea walking in the house.  Short of breath after walking about 100 feet.  +Orthopnea, no PND.  No lightheadedness.  She has been on Opsumit for about a week, thinks it may be helping a little. Gets cramps when she takes metolazone.   6 minute walk (8/16): 180 ft/55 m  Labs 7/16 K 4.1, creatinine 1.18, HCT 40.4 8/16 K 3.4, creatinine 1.37  PMH: 1. Chronic Diastolic CHF: RHC 2/69/48 with RA mean 16; PA 70/32, mean 47; PCWP mean 20; Cardiac Index (Thermo) 2.07; PVR 5.5 WU.  Echo (3/16) with EF 65-70%, D-shaped interventricular septum, moderately dilated RV with severely reduced systolic function, PASP 546 mmHg.  2. Pulmonary arterial HTN: See RHC above.  RV dysfunction as per echo above. V/Q scan in 3/16 concerning for COPD versus chronic PE, Repeat CTAs negative for PE. There is definitely a contribution from COPD and OHS/OSA  (group 3). However, there could be a contribution from sarcoidosis. 3. Pulmonary Sarcoidosis: Last CT in 7/16 showed chronic centrilobular and paraseptal emphysematous changes as well as the reticulonodular peribronchovascular opacities which are likely associated with the known sarcoidosis. 4. HTN 5. Dyslipidemia 6. COPD 7. OHS/OSA: Home oxygen and CPAP.  8. GERD 9. Obesity 10. CKD  Review of Systems: All systems reviewed and negative except as per HPI.   Current Outpatient Prescriptions  Medication Sig Dispense Refill  . albuterol (PROAIR HFA) 108 (90 BASE) MCG/ACT inhaler Inhale 2 puffs into the lungs every 6 (six) hours as needed for wheezing or shortness of breath. 8.5 g 5  . albuterol (PROVENTIL) (2.5 MG/3ML) 0.083% nebulizer solution INHALE CONTENTS OF 1 VIAL VIA NEBULIZER EVERY 6 HOURS AS NEEDED FOR WHEEZING OR SHORTNESS OF BREATH 1125 mL 0  . ALPRAZolam (XANAX) 1 MG tablet Take 1 tablet (1 mg total) by mouth 3 (three) times daily. 90 tablet 3  . aspirin EC 81 MG tablet Take 81 mg by mouth daily.    Marland Kitchen BIOTIN PO Take 1 capsule by mouth daily.    Marland Kitchen buPROPion (WELLBUTRIN XL) 150 MG 24 hr tablet Take 1 tablet (150 mg total) by mouth daily. 30 tablet 2  . cetirizine (ZYRTEC) 1 MG/ML syrup Take 10 mLs (10 mg total) by mouth at bedtime. 300 mL 0  . diclofenac sodium (VOLTAREN) 1 % GEL Apply 4 g topically 4 (four) times daily. 100 g 0  .  esomeprazole (NEXIUM) 40 MG capsule Take 40 mg by mouth 2 (two) times daily before a meal.    . fluticasone (FLONASE) 50 MCG/ACT nasal spray Place 2 sprays into both nostrils daily. 16 g 5  . gabapentin (NEURONTIN) 300 MG capsule Take 300 mg by mouth daily as needed (neuropathy pain).     Marland Kitchen glimepiride (AMARYL) 1 MG tablet Take 1 mg by mouth daily with breakfast.    . HYDROcodone-acetaminophen (NORCO/VICODIN) 5-325 MG per tablet Take 1 tablet by mouth 4 (four) times daily.     . hydrOXYzine (ATARAX/VISTARIL) 10 MG tablet TAKE 1 TABLET BY MOUTH THREE  TIMES DAILY AS NEEDED FOR ITCHING 30 tablet 11  . insulin aspart (NOVOLOG FLEXPEN) 100 UNIT/ML FlexPen Inject 5 Units into the skin 3 (three) times daily with meals. (Patient taking differently: Inject 6-10 Units into the skin 3 (three) times daily with meals. ) 21 mL 0  . Insulin Glargine (LANTUS SOLOSTAR) 100 UNIT/ML Solostar Pen Inject 72 Units into the skin 2 (two) times daily. 15 mL 2  . Macitentan 10 MG TABS Take 10 mg by mouth daily. 30 tablet 1  . metolazone (ZAROXOLYN) 2.5 MG tablet Take 1 tablet (2.5 mg total) by mouth 3 (three) times a week. Every Mon, Wed and Sat 15 tablet 3  . Multiple Vitamin (MULTIVITAMIN) capsule Take 1 capsule by mouth once a week. Mondays    . omega-3 fish oil (MAXEPA) 1000 MG CAPS capsule Take 1 capsule by mouth daily.    Glory Rosebush DELICA LANCETS 66Z MISC CHECK BLOOD SUGAR TWICE DAILY 100 each 5  . potassium chloride SA (K-DUR,KLOR-CON) 20 MEQ tablet Take 60 meq (3 tablets) in the morning and 40 meq (2 tablets) in the evening, take an extra 40 meq with Metolazone 480 tablet 3  . spironolactone (ALDACTONE) 25 MG tablet Take 25 mg by mouth 2 (two) times daily.    Marland Kitchen tiZANidine (ZANAFLEX) 2 MG tablet Take 2 mg by mouth 2 (two) times daily as needed for muscle spasms.   3  . torsemide (DEMADEX) 20 MG tablet Take 3 tablets (60 mg total) by mouth 2 (two) times daily. 180 tablet 0  . varenicline (CHANTIX) 0.5 MG tablet Take 1 tablet (0.5 mg total) by mouth 2 (two) times daily with a meal.    . Magnesium Oxide 400 (240 MG) MG TABS Take 400 mg by mouth daily. 30 tablet 6   No current facility-administered medications for this encounter.    Allergies  Allergen Reactions  . Simvastatin Other (See Comments)    Severe leg pain and burning  . Sulfamethoxazole-Trimethoprim Nausea And Vomiting    Causes Projectile Vomiting  . Zolpidem Tartrate Other (See Comments)    Chest pain  . Azithromycin Other (See Comments)    Resistant to med  . Ceftin [Cefuroxime] Swelling and  Other (See Comments)    MOUTH SWELLING  . Doxycycline Other (See Comments)    Resistant to med  . Latex Itching and Rash    Also burning sensations  . Metformin And Related Diarrhea    Severe diarrhea  . Metolazone Other (See Comments)    MYALGIAS  . Metronidazole Nausea And Vomiting  . Morphine-Naltrexone Palpitations  . Ciprofloxacin Nausea Only  . Tramadol Itching and Nausea Only    Social History   Social History  . Marital Status: Legally Separated    Spouse Name: N/A  . Number of Children: N/A  . Years of Education: N/A   Occupational History  .  Not on file.   Social History Main Topics  . Smoking status: Former Smoker -- 0.25 packs/day for 29 years    Types: Cigarettes    Quit date: 11/24/2014  . Smokeless tobacco: Never Used  . Alcohol Use: 0.0 oz/week    0 Standard drinks or equivalent per week     Comment: occasionally  . Drug Use: Yes    Special: Cocaine, Marijuana     Comment: remote cocaine; current marijuana  . Sexual Activity: Not on file   Other Topics Concern  . Not on file   Social History Narrative   Pt is single. No children. On disability   Family History  Problem Relation Age of Onset  . Hypertension Mother   . Heart disease Mother   . Emphysema Father   . Hypertension Father   . Stomach cancer Father   . Heart disease Father   . Allergies Brother   . Stomach cancer Brother   . Congestive Heart Failure Brother   . Diabetes type II Brother   . Heart disease Sister   . Diabetes type II Sister   . Heart attack Sister   . Stroke Sister   . Hypertension Sister   . Heart failure Sister     diastolic  . Hypertension Sister     Danley Danker Vitals:   05/11/15 1136  BP: 118/60  Pulse: 96  Weight: 290 lb 12.8 oz (131.906 kg)  SpO2: 96%    PHYSICAL EXAM: General:  Well appearing. On 02 via [place]. Obese.  HEENT: normal Neck: supple. Thick, JVP 8-9 cm. Carotids 2+ bilat; no bruits. No lymphadenopathy or thryomegaly appreciated. Cor: PMI  nondisplaced. Regular rate & rhythm. No rubs, gallops.  2/6 HSM LLSB. Lungs: Diminished throughout. Abdomen: Obese, soft, nontender, nondistended. No hepatosplenomegaly. No bruits or masses. Good bowel sounds. Extremities: no cyanosis, clubbing, rash. Trace peripheral edema bilaterally. Neuro: alert & oriented x 3, cranial nerves grossly intact. moves all 4 extremities w/o difficulty. Affect pleasant.  ASSESSMENT & PLAN: 1. 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 She has quit smoking on Chantix. She has OHS/OSA on oxygen during the day and CPAP at night. She also has biopsy-proven sarcoidosis. She has had some changes on chest CT indicative of possible sarcoidosis progression, but per last pulmonary note this does not seem to be her primary problem at this time. She has not been on steroids for sarcoidosis chronically. V/Q scan in 3/16 concerning for COPD versus chronic PE, but she has had several chest CTAs that are not indicative of acute or chronic PE.  - Continue oxygen and CPAP for OHS/OSA.  2. Pulmonary hypertension: Multiple possible reasons for pulmonary hypertension. PA systolic pressure estimate 106 mmHg by last echo. [place] 6/16 showed severe pulmonary arterial hypertension with some pulmonary venous hypertension => RA 16, PA 70/32 mean 46, PCWP 20, PVR 5.5. There is definitely a contribution from COPD and OHS/OSA (group 3). However, there could be a contribution from the co-existing pulmonary sarcoidosis. 6 minute walk today was poor, but improved given she was unable to do a 6 minute walk earlier in the month.  - She has started on macitentan as there is some evidence for ERA use in sarcoidosis-related PAH. Given data on combination therapy, would will go ahead and work to get her on tadalafil 20 mg daily.  3. Acute on chronic diastolic CHF with prominent RV dysfunction: Severely reduced RV function on echo. R>L heart failure on cath in  6/16.Weight is down and volume status looks better, but still volume overloaded.  Symptoms NYHA class III. - Continue torsemide 60 mg po bid - Increase metolazone to M/W/Sat (three times a week).  She will take an extra KCl 40 on metolazone days.   - BMET in 1 week.  - Given cramping, start magnesium supplement.   4. HTN: Stable currently. 5. Dyslipidemia 6. COPD: No longer smoking.   Loralie Champagne 05/11/2015

## 2015-05-11 NOTE — Progress Notes (Signed)
6 min walk test completed, pt only able to walk 180 ft in 2 minutes and had to stop due to SOB per pt.  She was on 4 L O2 via [place], at beginning sat 93 % HR 86, sats dropped to 85% HR 151 at 2 min when pt stopped the test.

## 2015-05-12 ENCOUNTER — Telehealth: Payer: Self-pay | Admitting: Internal Medicine

## 2015-05-12 NOTE — Telephone Encounter (Signed)
Benita from [facility] called and she will be faxing pts discharge summary to you. pts medication is not on it and they need to be in the claim form within 30 days of discharge.  I gave her your fax number

## 2015-05-13 ENCOUNTER — Other Ambulatory Visit: Payer: Self-pay | Admitting: Internal Medicine

## 2015-05-13 DIAGNOSIS — F419 Anxiety disorder, unspecified: Secondary | ICD-10-CM | POA: Diagnosis not present

## 2015-05-13 DIAGNOSIS — M79609 Pain in unspecified limb: Secondary | ICD-10-CM | POA: Diagnosis not present

## 2015-05-13 DIAGNOSIS — I272 Other secondary pulmonary hypertension: Secondary | ICD-10-CM | POA: Diagnosis not present

## 2015-05-13 DIAGNOSIS — I10 Essential (primary) hypertension: Secondary | ICD-10-CM | POA: Diagnosis not present

## 2015-05-13 DIAGNOSIS — J9611 Chronic respiratory failure with hypoxia: Secondary | ICD-10-CM | POA: Diagnosis not present

## 2015-05-13 DIAGNOSIS — J441 Chronic obstructive pulmonary disease with (acute) exacerbation: Secondary | ICD-10-CM | POA: Diagnosis not present

## 2015-05-13 DIAGNOSIS — D860 Sarcoidosis of lung: Secondary | ICD-10-CM | POA: Diagnosis not present

## 2015-05-13 DIAGNOSIS — M549 Dorsalgia, unspecified: Secondary | ICD-10-CM | POA: Diagnosis not present

## 2015-05-13 DIAGNOSIS — M545 Low back pain: Secondary | ICD-10-CM | POA: Diagnosis not present

## 2015-05-13 DIAGNOSIS — I5032 Chronic diastolic (congestive) heart failure: Secondary | ICD-10-CM | POA: Diagnosis not present

## 2015-05-13 DIAGNOSIS — E662 Morbid (severe) obesity with alveolar hypoventilation: Secondary | ICD-10-CM | POA: Diagnosis not present

## 2015-05-13 DIAGNOSIS — J45909 Unspecified asthma, uncomplicated: Secondary | ICD-10-CM | POA: Diagnosis not present

## 2015-05-13 DIAGNOSIS — G8929 Other chronic pain: Secondary | ICD-10-CM | POA: Diagnosis not present

## 2015-05-13 DIAGNOSIS — E119 Type 2 diabetes mellitus without complications: Secondary | ICD-10-CM | POA: Diagnosis not present

## 2015-05-13 DIAGNOSIS — G4733 Obstructive sleep apnea (adult) (pediatric): Secondary | ICD-10-CM | POA: Diagnosis not present

## 2015-05-13 NOTE — Telephone Encounter (Signed)
Pt needs to have UDS collected per PCP in order to have this rx filled. PCP is out until Monday.  please advise if this is okay to fill

## 2015-05-13 NOTE — Telephone Encounter (Signed)
error 

## 2015-05-14 ENCOUNTER — Other Ambulatory Visit: Payer: Self-pay | Admitting: Internal Medicine

## 2015-05-14 ENCOUNTER — Telehealth: Payer: Self-pay | Admitting: Internal Medicine

## 2015-05-14 ENCOUNTER — Telehealth: Payer: Self-pay

## 2015-05-14 DIAGNOSIS — Z79899 Other long term (current) drug therapy: Secondary | ICD-10-CM | POA: Diagnosis not present

## 2015-05-14 NOTE — Telephone Encounter (Signed)
Printed and signed, she needs to come pick up in person for UDS.

## 2015-05-14 NOTE — Telephone Encounter (Signed)
Sherlynn Carbon from [facility] needs verbal authorization to extend her home care physical therapy  2x for 4wks She can be reached at [phone]

## 2015-05-14 NOTE — Telephone Encounter (Signed)
2 things  1 - Pt states that pain management is giving steroid injections. This is causing her sugars to become uncontrolled.   2 - Pt stated that pain management is suggesting a Lumbar Radio Frequency Ablation. Pt would like to know what you think about this particular procedure, she is uncomfortable about doing it without your opinion and clearance.   Please advise  Side note: pt alprazolam rx was okay by [facility] with UDS request per PCP advise from 11/2014 refill.

## 2015-05-15 ENCOUNTER — Other Ambulatory Visit: Payer: Self-pay | Admitting: Internal Medicine

## 2015-05-15 ENCOUNTER — Encounter: Payer: Self-pay | Admitting: Internal Medicine

## 2015-05-17 ENCOUNTER — Encounter: Payer: Self-pay | Admitting: Internal Medicine

## 2015-05-17 NOTE — Telephone Encounter (Signed)
Currently taking Lantus 50 units  Novolog 10-20 units  Hospital advised 72 units of the Lantus and 10 units plus sliding scale of the Novolog.  Advised pt to do the 72 units per hospital instruction.

## 2015-05-17 NOTE — Telephone Encounter (Signed)
Spoke with Tillie Rung and verbal okay given to extend PT 2 x a week for 4 weeks.

## 2015-05-17 NOTE — Telephone Encounter (Signed)
Pt should increase dose of meal time insulin coverage as needed for sugars elevated due to steroids - will need examples of cbgs and current dosing if more detailed instructions are necessary - or OV with any provider Ok for RFA if recommended - may help and has good benefit for many patients, generally safer than surgery - esp with patients comorbidities UDS should be done at next OV (regardless of provider being seen)

## 2015-05-18 MED ORDER — INSULIN GLARGINE 100 UNIT/ML SOLOSTAR PEN: 72.0000 [IU] | PEN_INJECTOR | Freq: Two times a day (BID) | SUBCUTANEOUS | Status: DC

## 2015-05-19 ENCOUNTER — Other Ambulatory Visit (HOSPITAL_COMMUNITY): Payer: Self-pay

## 2015-05-19 ENCOUNTER — Telehealth (HOSPITAL_COMMUNITY): Payer: Self-pay | Admitting: *Deleted

## 2015-05-19 NOTE — Telephone Encounter (Signed)
Completed PA for pt's Adcirca 20 mg, med approved through 05/17/16

## 2015-05-19 NOTE — Telephone Encounter (Signed)
Agree w/ Lantus 72 U and ongoing SSI

## 2015-05-20 ENCOUNTER — Telehealth: Payer: Self-pay | Admitting: Internal Medicine

## 2015-05-20 DIAGNOSIS — E119 Type 2 diabetes mellitus without complications: Secondary | ICD-10-CM | POA: Diagnosis not present

## 2015-05-20 DIAGNOSIS — D860 Sarcoidosis of lung: Secondary | ICD-10-CM | POA: Diagnosis not present

## 2015-05-20 DIAGNOSIS — J9611 Chronic respiratory failure with hypoxia: Secondary | ICD-10-CM | POA: Diagnosis not present

## 2015-05-20 DIAGNOSIS — I5032 Chronic diastolic (congestive) heart failure: Secondary | ICD-10-CM | POA: Diagnosis not present

## 2015-05-20 DIAGNOSIS — G4733 Obstructive sleep apnea (adult) (pediatric): Secondary | ICD-10-CM | POA: Diagnosis not present

## 2015-05-20 DIAGNOSIS — I272 Other secondary pulmonary hypertension: Secondary | ICD-10-CM | POA: Diagnosis not present

## 2015-05-20 DIAGNOSIS — M549 Dorsalgia, unspecified: Secondary | ICD-10-CM | POA: Diagnosis not present

## 2015-05-20 DIAGNOSIS — G8929 Other chronic pain: Secondary | ICD-10-CM | POA: Diagnosis not present

## 2015-05-20 DIAGNOSIS — J441 Chronic obstructive pulmonary disease with (acute) exacerbation: Secondary | ICD-10-CM | POA: Diagnosis not present

## 2015-05-20 DIAGNOSIS — I10 Essential (primary) hypertension: Secondary | ICD-10-CM | POA: Diagnosis not present

## 2015-05-20 DIAGNOSIS — F419 Anxiety disorder, unspecified: Secondary | ICD-10-CM | POA: Diagnosis not present

## 2015-05-20 DIAGNOSIS — E662 Morbid (severe) obesity with alveolar hypoventilation: Secondary | ICD-10-CM | POA: Diagnosis not present

## 2015-05-20 DIAGNOSIS — J45909 Unspecified asthma, uncomplicated: Secondary | ICD-10-CM | POA: Diagnosis not present

## 2015-05-20 NOTE — Telephone Encounter (Signed)
Patient called in with her sugar levels 8/22 - 358, 332 - 65 units Lantis, 60 units Novolag. After that it read 323 After 72 units Lantis 10 units of Novolag 3 times a day 8/23 - 296, 304, 259, 303, 255, 387 8/24 - 195 in the morning and 361 after lunch

## 2015-05-20 NOTE — Telephone Encounter (Signed)
Pt will need an OV to further discuss sugar levels. Pt may choose to wait til

## 2015-05-21 DIAGNOSIS — J9819 Other pulmonary collapse: Secondary | ICD-10-CM | POA: Diagnosis not present

## 2015-05-21 NOTE — Telephone Encounter (Signed)
Patient will wait till next appt

## 2015-05-23 ENCOUNTER — Other Ambulatory Visit: Payer: Self-pay | Admitting: Internal Medicine

## 2015-05-24 ENCOUNTER — Other Ambulatory Visit: Payer: Self-pay

## 2015-05-24 ENCOUNTER — Telehealth (HOSPITAL_COMMUNITY): Payer: Self-pay

## 2015-05-24 DIAGNOSIS — J441 Chronic obstructive pulmonary disease with (acute) exacerbation: Secondary | ICD-10-CM | POA: Diagnosis not present

## 2015-05-24 DIAGNOSIS — J9611 Chronic respiratory failure with hypoxia: Secondary | ICD-10-CM | POA: Diagnosis not present

## 2015-05-24 DIAGNOSIS — E1122 Type 2 diabetes mellitus with diabetic chronic kidney disease: Secondary | ICD-10-CM | POA: Diagnosis not present

## 2015-05-24 DIAGNOSIS — I272 Other secondary pulmonary hypertension: Secondary | ICD-10-CM | POA: Diagnosis not present

## 2015-05-24 DIAGNOSIS — G8929 Other chronic pain: Secondary | ICD-10-CM | POA: Diagnosis not present

## 2015-05-24 DIAGNOSIS — D860 Sarcoidosis of lung: Secondary | ICD-10-CM | POA: Diagnosis not present

## 2015-05-24 DIAGNOSIS — M549 Dorsalgia, unspecified: Secondary | ICD-10-CM | POA: Diagnosis not present

## 2015-05-24 DIAGNOSIS — E662 Morbid (severe) obesity with alveolar hypoventilation: Secondary | ICD-10-CM | POA: Diagnosis not present

## 2015-05-24 DIAGNOSIS — I129 Hypertensive chronic kidney disease with stage 1 through stage 4 chronic kidney disease, or unspecified chronic kidney disease: Secondary | ICD-10-CM | POA: Diagnosis not present

## 2015-05-24 DIAGNOSIS — N183 Chronic kidney disease, stage 3 (moderate): Secondary | ICD-10-CM | POA: Diagnosis not present

## 2015-05-24 DIAGNOSIS — F332 Major depressive disorder, recurrent severe without psychotic features: Secondary | ICD-10-CM | POA: Diagnosis not present

## 2015-05-24 DIAGNOSIS — I5032 Chronic diastolic (congestive) heart failure: Secondary | ICD-10-CM | POA: Diagnosis not present

## 2015-05-24 DIAGNOSIS — E1165 Type 2 diabetes mellitus with hyperglycemia: Secondary | ICD-10-CM | POA: Diagnosis not present

## 2015-05-24 NOTE — Telephone Encounter (Signed)
Called patient regarding entrance to Pulmonary Rehab.  Patient states that they are interested in attending the program.  [name] is going to verify insurance coverage and follow up.

## 2015-05-24 NOTE — Patient Outreach (Addendum)
Telephone call to patient to schedule home visit. Patient identified herself using HIPPA identifiers. Purpose of call stated as being to schedule home visit as part of transition of care program, home visit will be to further assess needs for community care coordination.   Patient stated she would not be available this week and feels  Better.  Patient stated she does not need a home visit and does not need any further assistance.  Patient agreed to call if further assistance is needed.  Plan: Discharge patient from my caseload per patient's request.

## 2015-05-25 ENCOUNTER — Telehealth (HOSPITAL_COMMUNITY): Payer: Self-pay

## 2015-05-25 DIAGNOSIS — Z1231 Encounter for screening mammogram for malignant neoplasm of breast: Secondary | ICD-10-CM | POA: Diagnosis not present

## 2015-05-25 DIAGNOSIS — Z01419 Encounter for gynecological examination (general) (routine) without abnormal findings: Secondary | ICD-10-CM | POA: Diagnosis not present

## 2015-05-25 DIAGNOSIS — N951 Menopausal and female climacteric states: Secondary | ICD-10-CM | POA: Diagnosis not present

## 2015-05-25 DIAGNOSIS — N760 Acute vaginitis: Secondary | ICD-10-CM | POA: Diagnosis not present

## 2015-05-25 DIAGNOSIS — N898 Other specified noninflammatory disorders of vagina: Secondary | ICD-10-CM | POA: Diagnosis not present

## 2015-05-25 NOTE — Telephone Encounter (Signed)
I have called and left a message with Jaquasia to inquire about participation in Pulmonary Rehab per Dr. Aundra Dubin referral. Will send letter in mail and follow up.

## 2015-05-26 ENCOUNTER — Ambulatory Visit (HOSPITAL_COMMUNITY)
Admission: RE | Admit: 2015-05-26 | Discharge: 2015-05-26 | Disposition: A | Discharge status: Home / Self Care | Payer: Medicare Other | Source: Ambulatory Visit | Attending: Cardiology | Admitting: Cardiology

## 2015-05-26 ENCOUNTER — Encounter (HOSPITAL_COMMUNITY): Payer: Self-pay | Admitting: *Deleted

## 2015-05-26 ENCOUNTER — Ambulatory Visit (HOSPITAL_COMMUNITY)
Admission: RE | Admit: 2015-05-26 | Discharge: 2015-05-26 | Disposition: A | Discharge status: Home / Self Care | Payer: Medicare Other | Source: Ambulatory Visit | Attending: Internal Medicine | Admitting: Internal Medicine

## 2015-05-26 ENCOUNTER — Other Ambulatory Visit: Payer: Self-pay | Admitting: *Deleted

## 2015-05-26 ENCOUNTER — Other Ambulatory Visit: Payer: Self-pay | Admitting: Pulmonary Disease

## 2015-05-26 VITALS — BP 108/52 | HR 105 | Wt 290.0 lb

## 2015-05-26 DIAGNOSIS — R0602 Shortness of breath: Secondary | ICD-10-CM | POA: Diagnosis not present

## 2015-05-26 DIAGNOSIS — I129 Hypertensive chronic kidney disease with stage 1 through stage 4 chronic kidney disease, or unspecified chronic kidney disease: Secondary | ICD-10-CM | POA: Insufficient documentation

## 2015-05-26 DIAGNOSIS — E662 Morbid (severe) obesity with alveolar hypoventilation: Secondary | ICD-10-CM

## 2015-05-26 DIAGNOSIS — Z833 Family history of diabetes mellitus: Secondary | ICD-10-CM | POA: Insufficient documentation

## 2015-05-26 DIAGNOSIS — G4733 Obstructive sleep apnea (adult) (pediatric): Secondary | ICD-10-CM

## 2015-05-26 DIAGNOSIS — N189 Chronic kidney disease, unspecified: Secondary | ICD-10-CM | POA: Insufficient documentation

## 2015-05-26 DIAGNOSIS — E785 Hyperlipidemia, unspecified: Secondary | ICD-10-CM | POA: Diagnosis not present

## 2015-05-26 DIAGNOSIS — I272 Other secondary pulmonary hypertension: Secondary | ICD-10-CM | POA: Diagnosis not present

## 2015-05-26 DIAGNOSIS — Z8249 Family history of ischemic heart disease and other diseases of the circulatory system: Secondary | ICD-10-CM | POA: Diagnosis not present

## 2015-05-26 DIAGNOSIS — Z7982 Long term (current) use of aspirin: Secondary | ICD-10-CM | POA: Diagnosis not present

## 2015-05-26 DIAGNOSIS — Z9989 Dependence on other enabling machines and devices: Principal | ICD-10-CM

## 2015-05-26 DIAGNOSIS — Z79899 Other long term (current) drug therapy: Secondary | ICD-10-CM | POA: Diagnosis not present

## 2015-05-26 DIAGNOSIS — Z87891 Personal history of nicotine dependence: Secondary | ICD-10-CM | POA: Diagnosis not present

## 2015-05-26 DIAGNOSIS — Z825 Family history of asthma and other chronic lower respiratory diseases: Secondary | ICD-10-CM | POA: Diagnosis not present

## 2015-05-26 DIAGNOSIS — Z91040 Latex allergy status: Secondary | ICD-10-CM | POA: Diagnosis not present

## 2015-05-26 DIAGNOSIS — Z794 Long term (current) use of insulin: Secondary | ICD-10-CM | POA: Diagnosis not present

## 2015-05-26 DIAGNOSIS — Z882 Allergy status to sulfonamides status: Secondary | ICD-10-CM | POA: Diagnosis not present

## 2015-05-26 DIAGNOSIS — J9611 Chronic respiratory failure with hypoxia: Secondary | ICD-10-CM | POA: Insufficient documentation

## 2015-05-26 DIAGNOSIS — I270 Primary pulmonary hypertension: Secondary | ICD-10-CM

## 2015-05-26 DIAGNOSIS — I5032 Chronic diastolic (congestive) heart failure: Secondary | ICD-10-CM | POA: Diagnosis not present

## 2015-05-26 DIAGNOSIS — J449 Chronic obstructive pulmonary disease, unspecified: Secondary | ICD-10-CM | POA: Insufficient documentation

## 2015-05-26 DIAGNOSIS — K219 Gastro-esophageal reflux disease without esophagitis: Secondary | ICD-10-CM | POA: Insufficient documentation

## 2015-05-26 DIAGNOSIS — R05 Cough: Secondary | ICD-10-CM | POA: Diagnosis not present

## 2015-05-26 DIAGNOSIS — Z9981 Dependence on supplemental oxygen: Secondary | ICD-10-CM | POA: Diagnosis not present

## 2015-05-26 DIAGNOSIS — D860 Sarcoidosis of lung: Secondary | ICD-10-CM | POA: Diagnosis not present

## 2015-05-26 LAB — BASIC METABOLIC PANEL
ANION GAP: 11 (ref 5–15)
BUN: 21 mg/dL — ABNORMAL HIGH (ref 6–20)
CALCIUM: 9.5 mg/dL (ref 8.9–10.3)
CO2: 30 mmol/L (ref 22–32)
Chloride: 92 mmol/L — ABNORMAL LOW (ref 101–111)
Creatinine, Ser: 1.39 mg/dL — ABNORMAL HIGH (ref 0.44–1.00)
GFR calc Af Amer: 51 mL/min — ABNORMAL LOW (ref >60)
GFR calc non Af Amer: 44 mL/min — ABNORMAL LOW (ref >60)
GLUCOSE: 203 mg/dL — AB (ref 65–99)
Potassium: 3.3 mmol/L — ABNORMAL LOW (ref 3.5–5.1)
Sodium: 133 mmol/L — ABNORMAL LOW (ref 135–145)

## 2015-05-26 LAB — BRAIN NATRIURETIC PEPTIDE: B Natriuretic Peptide: 12.9 pg/mL (ref 0.0–100.0)

## 2015-05-26 NOTE — Progress Notes (Signed)
Patient ID: [id number] Anderson, female   DOB: 02-18-1966, 49 y.o.   MRN: [id number] Primary Care: Asa Lente Primary Cardiologist: Aundra Dubin  HPI:  [name] is a 49 y.o. with a PMH of OHS/OSA on Cpap and home oxygen, chronic diastolic CHF with prominent RV dysfunction, pulmonary hypertension, Fibromyalgia, Pulmonary sarcoidosis, HTN, dyslipidemia, and COPD. She was hospitalized for acute on chronic diastolic CHF had RHC in 10/4266 (see below) with plans to place her on ERA (macitentan) after left heart filling pressure optimized as there is some evidence for ERA use in sarcoidosis-related PAH.   She was admitted on 04/18/15 for SOB and palpitations.  Pt was placed on IV lasix with -19 L of output during that admission.  CT of the chest showed new infiltrate along with fever and she was also treated empirically for HCAP. Her weight on discharge was 292 lbs. She is now on macitentan and recently started Adcirca 20 mg daily.  Today, she says that she does not feel good.  Weight is stable on our scale.  Feels like she is retaining fluid and not urinating enough.  She has been taking torsemide 100 bid with metolazone 3 times/week.  She has been taking ibuprofen about 4-5 times/week.  She is short of breath after walking 50-75 yards.  Chronic orthopnea.  Using CPAP.  No chest pain.  No fever, chills, cough.    6 minute walk (8/16): 180 ft/55 m  ECG: ectopic atrial rhythm at 93  Labs 7/16 K 4.1, creatinine 1.18, HCT 40.4 8/16 K 3.4, creatinine 1.37  PMH: 1. Chronic Diastolic CHF: RHC 3/41/96 with RA mean 16; PA 70/32, mean 47; PCWP mean 20; Cardiac Index (Thermo) 2.07; PVR 5.5 WU.  Echo (3/16) with EF 65-70%, D-shaped interventricular septum, moderately dilated RV with severely reduced systolic function, PASP 222 mmHg.  2. Pulmonary arterial HTN: See RHC above.  RV dysfunction as per echo above. V/Q scan in 3/16 concerning for COPD versus chronic PE, Repeat CTAs negative for PE. There is definitely a  contribution from COPD and OHS/OSA (group 3). However, there could be a contribution from sarcoidosis. 3. Pulmonary Sarcoidosis: Last CT in 7/16 showed chronic centrilobular and paraseptal emphysematous changes as well as the reticulonodular peribronchovascular opacities which are likely associated with the known sarcoidosis. 4. HTN 5. Dyslipidemia 6. COPD 7. OHS/OSA: Home oxygen and CPAP.  8. GERD 9. Obesity 10. CKD  Review of Systems: All systems reviewed and negative except as per HPI.   Current Outpatient Prescriptions  Medication Sig Dispense Refill  . albuterol (PROAIR HFA) 108 (90 BASE) MCG/ACT inhaler Inhale 2 puffs into the lungs every 6 (six) hours as needed for wheezing or shortness of breath. 8.5 g 5  . albuterol (PROVENTIL) (2.5 MG/3ML) 0.083% nebulizer solution INHALE CONTENTS OF 1 VIAL VIA NEBULIZER EVERY 6 HOURS AS NEEDED FOR WHEEZING OR SHORTNESS OF BREATH 1125 mL 0  . ALPRAZolam (XANAX) 1 MG tablet TAKE 1 TABLET BY MOUTH THREE TIMES DAILY 90 tablet 0  . aspirin EC 81 MG tablet Take 81 mg by mouth daily.    Marland Kitchen BIOTIN PO Take 1 capsule by mouth daily.    Marland Kitchen buPROPion (WELLBUTRIN XL) 150 MG 24 hr tablet Take 1 tablet (150 mg total) by mouth daily. 30 tablet 2  . cetirizine (ZYRTEC) 1 MG/ML syrup Take 10 mLs (10 mg total) by mouth at bedtime. 300 mL 0  . diclofenac sodium (VOLTAREN) 1 % GEL Apply 4 g topically 4 (four) times daily as needed (  knee pain).    Marland Kitchen esomeprazole (NEXIUM) 40 MG capsule Take 40 mg by mouth 2 (two) times daily before a meal.    . fluticasone (FLONASE) 50 MCG/ACT nasal spray Place 1 spray into both nostrils daily as needed for allergies or rhinitis.    Marland Kitchen gabapentin (NEURONTIN) 300 MG capsule Take 300 mg by mouth daily as needed (neuropathy pain).     Marland Kitchen glimepiride (AMARYL) 1 MG tablet Take 1 mg by mouth daily with breakfast.    . HYDROcodone-acetaminophen (NORCO/VICODIN) 5-325 MG per tablet Take 1 tablet by mouth 4 (four) times daily.     . hydrOXYzine  (ATARAX/VISTARIL) 10 MG tablet TAKE 1 TABLET BY MOUTH THREE TIMES DAILY AS NEEDED FOR ITCHING 30 tablet 11  . insulin aspart (NOVOLOG FLEXPEN) 100 UNIT/ML FlexPen Inject 6-10 Units into the skin 3 (three) times daily with meals.    . Insulin Glargine (LANTUS SOLOSTAR) 100 UNIT/ML Solostar Pen Inject 72 Units into the skin 2 (two) times daily. 15 pen 2  . Liraglutide (VICTOZA) 18 MG/3ML SOPN Inject 3 mLs into the skin daily.    . Macitentan 10 MG TABS Take 10 mg by mouth daily. 30 tablet 1  . metolazone (ZAROXOLYN) 2.5 MG tablet Take 1 tablet (2.5 mg total) by mouth 3 (three) times a week. Every Mon, Wed and Sat 15 tablet 3  . Multiple Vitamin (MULTIVITAMIN) capsule Take 1 capsule by mouth once a week. Mondays    . naproxen sodium (ANAPROX) 220 MG tablet Take 220 mg by mouth 2 (two) times daily as needed (joint pain).    Marland Kitchen omega-3 fish oil (MAXEPA) 1000 MG CAPS capsule Take 1 capsule by mouth daily.    Glory Rosebush DELICA LANCETS 85I MISC CHECK BLOOD SUGAR TWICE DAILY 100 each 5  . potassium chloride SA (K-DUR,KLOR-CON) 20 MEQ tablet Take 60 meq (3 tablets) in the morning and 40 meq (2 tablets) in the evening, take an extra 40 meq with Metolazone 480 tablet 3  . spironolactone (ALDACTONE) 25 MG tablet Take 25 mg by mouth 2 (two) times daily.    . Tadalafil, PAH, (ADCIRCA) 20 MG TABS Take 1 tablet by mouth daily.    Marland Kitchen tiZANidine (ZANAFLEX) 2 MG tablet Take 2 mg by mouth 2 (two) times daily as needed for muscle spasms.   3  . torsemide (DEMADEX) 100 MG tablet Take 1 tablet (100 mg total) by mouth 2 (two) times daily. 180 tablet 3  . varenicline (CHANTIX) 0.5 MG tablet Take 0.5 mg by mouth every other day.     No current facility-administered medications for this encounter.    Allergies  Allergen Reactions  . Simvastatin Other (See Comments)    Severe leg pain and burning  . Sulfamethoxazole-Trimethoprim Nausea And Vomiting    Causes Projectile Vomiting  . Zolpidem Tartrate Other (See Comments)     Chest pain  . Azithromycin Other (See Comments)    Resistant to med  . Ceftin [Cefuroxime] Swelling and Other (See Comments)    MOUTH SWELLING  . Doxycycline Other (See Comments)    Resistant to med  . Latex Itching and Rash    Also burning sensations  . Metformin And Related Diarrhea    Severe diarrhea  . Metronidazole Nausea And Vomiting  . Morphine-Naltrexone Palpitations  . Ciprofloxacin Nausea Only  . Tramadol Itching and Nausea Only    Social History   Social History  . Marital Status: Legally Separated    Spouse Name: N/A  . Number  of Children: N/A  . Years of Education: N/A   Occupational History  . Not on file.   Social History Main Topics  . Smoking status: Former Smoker -- 0.25 packs/day for 29 years    Types: Cigarettes    Quit date: 11/24/2014  . Smokeless tobacco: Never Used  . Alcohol Use: 0.0 oz/week    0 Standard drinks or equivalent per week     Comment: occasionally  . Drug Use: Yes    Special: Cocaine, Marijuana     Comment: remote cocaine; current marijuana  . Sexual Activity: Not on file   Other Topics Concern  . Not on file   Social History Narrative   Pt is single. No children. On disability   Family History  Problem Relation Age of Onset  . Hypertension Mother   . Heart disease Mother   . Emphysema Father   . Hypertension Father   . Stomach cancer Father   . Heart disease Father   . Allergies Brother   . Stomach cancer Brother   . Congestive Heart Failure Brother   . Diabetes type II Brother   . Heart disease Sister   . Diabetes type II Sister   . Heart attack Sister   . Stroke Sister   . Hypertension Sister   . Heart failure Sister     diastolic  . Hypertension Sister     Danley Danker Vitals:   05/26/15 1058  BP: 108/52  Pulse: 105  Weight: 290 lb (131.543 kg)  SpO2: 90%    PHYSICAL EXAM: General:  NAD. On 02 via [place]. Obese.  HEENT: normal Neck: supple. Thick, difficult exam but JVP does not seem significantly  elevated. Carotids 2+ bilat; no bruits. No lymphadenopathy or thryomegaly appreciated. Cor: PMI nondisplaced. Regular rate & rhythm. No rubs, gallops.  2/6 HSM LLSB. Lungs: Diminished throughout. Abdomen: Obese, soft, nontender, nondistended. No hepatosplenomegaly. No bruits or masses. Good bowel sounds. Extremities: no cyanosis, clubbing, rash. No edema.  Neuro: alert & oriented x 3, cranial nerves grossly intact. moves all 4 extremities w/o difficulty. Affect pleasant.  ASSESSMENT & PLAN: 1. 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 She has quit smoking on Chantix. She has OHS/OSA on oxygen during the day and CPAP at night. She also has biopsy-proven sarcoidosis. She has had some changes on chest CT indicative of possible sarcoidosis progression, but per last pulmonary note this does not seem to be her primary problem at this time. She has not been on steroids for sarcoidosis chronically. V/Q scan in 3/16 concerning for COPD versus chronic PE, but she has had several chest CTAs that are not indicative of acute or chronic PE.  - Continue oxygen and CPAP for OHS/OSA.  - She plans to start pulmonary rehab.  - CXR today given increased symptoms.  2. Pulmonary hypertension: Multiple possible reasons for pulmonary hypertension. PA systolic pressure estimate 106 mmHg by last echo. [facility] 6/16 showed severe pulmonary arterial hypertension with some pulmonary venous hypertension => RA 16, PA 70/32 mean 46, PCWP 20, PVR 5.5. There is definitely a contribution from COPD and OHS/OSA (group 3). However, there could be a contribution from the co-existing pulmonary sarcoidosis. She does not feel like she can do a 6 minute walk today.  - She has started on macitentan as there is some evidence for ERA use in sarcoidosis-related PAH. Given data on combination therapy, she has also been started on Adcirca 20 mg daily.  3. Chronic diastolic CHF  with prominent RV  dysfunction: Severely reduced RV function on echo. R>L heart failure on cath in 6/16.Symptoms NYHA class III.  She feels like she is volume overloaded and does feel good.  Her exam is very difficult for volume but she does not appear markedly volume overloaded.  Her weight is stable.  She has recently increased her torsemide to 100 mg bid.  - Continue torsemide 100 mg po bid and continue metolazone M/W/Sat (three times a week).  She will take an extra KCl 40 on metolazone days.  I am not going to increase her diuretics at this time as I am not convinced that she is significantly volume overloaded (exam is very difficult for volume assessment), and she may feel bad because of overdiuresis/AKI.   - To aid with volume assessment, I am going to arrange for RHC early next week . We discussed risks/benefits of the procedure and she agrees to proceed.  - She needs to stop ibuprofen use.  We discussed this.  - BMET/BNP today.  4. HTN: Stable currently. 5. Dyslipidemia 6. COPD: No longer smoking.  7. CKD: Follow creatinine closely with diuresis, check today.   Loralie Champagne 05/26/2015

## 2015-05-26 NOTE — Patient Instructions (Signed)
Labs today   Chest x-ray today  Right Heart Catheterization, see instruction sheet  Your physician recommends that you schedule a follow-up appointment in: 3 weeks

## 2015-05-26 NOTE — Progress Notes (Signed)
Advanced Heart Failure Medication Review by a Pharmacist  Does the patient  feel that his/her medications are working for him/her?  yes  Has the patient been experiencing any side effects to the medications prescribed?  no  Does the patient measure his/her own blood pressure or blood glucose at home?  yes   Does the patient have any problems obtaining medications due to transportation or finances?   no  Understanding of regimen: good Understanding of indications: good Potential of compliance: fair    Pharmacist comments:  Ms. Blumer is a pleasant 49 yo F presenting without a medication list. She has a good understanding of her regimen and is able to verbalize dosages to me. She does state that she sometimes only take 50 mg of torsemide BID but lately has been taking her prescribed dose of 100 mg BID with increasing edema and SOB. She is weaning herself off of the Chantix and states that she has completely quit smoking cigarettes which I praised her for. All other medications were reconciled.  Ruta Hinds. Velva Harman, PharmD, BCPS, CPP Clinical Pharmacist Pager: [phone] Phone: [phone] 05/26/2015 11:22 AM

## 2015-05-27 DIAGNOSIS — G894 Chronic pain syndrome: Secondary | ICD-10-CM | POA: Diagnosis not present

## 2015-05-27 DIAGNOSIS — Z79899 Other long term (current) drug therapy: Secondary | ICD-10-CM | POA: Diagnosis not present

## 2015-05-27 DIAGNOSIS — M488X6 Other specified spondylopathies, lumbar region: Secondary | ICD-10-CM | POA: Diagnosis not present

## 2015-05-27 DIAGNOSIS — M25569 Pain in unspecified knee: Secondary | ICD-10-CM | POA: Diagnosis not present

## 2015-05-27 DIAGNOSIS — M47817 Spondylosis without myelopathy or radiculopathy, lumbosacral region: Secondary | ICD-10-CM | POA: Diagnosis not present

## 2015-05-29 ENCOUNTER — Encounter: Payer: Self-pay | Admitting: Internal Medicine

## 2015-05-30 ENCOUNTER — Emergency Department (HOSPITAL_COMMUNITY): Payer: Medicare Other

## 2015-05-30 ENCOUNTER — Other Ambulatory Visit: Payer: Self-pay

## 2015-05-30 ENCOUNTER — Inpatient Hospital Stay (HOSPITAL_COMMUNITY)
Admission: EM | Admit: 2015-05-30 | Discharge: 2015-06-03 | DRG: 286 | Disposition: A | Discharge status: Home Health | Payer: Medicare Other | Attending: Internal Medicine | Admitting: Internal Medicine

## 2015-05-30 ENCOUNTER — Encounter (HOSPITAL_COMMUNITY): Payer: Self-pay | Admitting: *Deleted

## 2015-05-30 DIAGNOSIS — E1122 Type 2 diabetes mellitus with diabetic chronic kidney disease: Secondary | ICD-10-CM | POA: Diagnosis present

## 2015-05-30 DIAGNOSIS — I959 Hypotension, unspecified: Secondary | ICD-10-CM | POA: Diagnosis not present

## 2015-05-30 DIAGNOSIS — E785 Hyperlipidemia, unspecified: Secondary | ICD-10-CM | POA: Diagnosis not present

## 2015-05-30 DIAGNOSIS — J45909 Unspecified asthma, uncomplicated: Secondary | ICD-10-CM | POA: Diagnosis present

## 2015-05-30 DIAGNOSIS — I272 Other secondary pulmonary hypertension: Secondary | ICD-10-CM | POA: Diagnosis present

## 2015-05-30 DIAGNOSIS — M549 Dorsalgia, unspecified: Secondary | ICD-10-CM | POA: Diagnosis present

## 2015-05-30 DIAGNOSIS — I5033 Acute on chronic diastolic (congestive) heart failure: Secondary | ICD-10-CM | POA: Diagnosis not present

## 2015-05-30 DIAGNOSIS — J9621 Acute and chronic respiratory failure with hypoxia: Secondary | ICD-10-CM | POA: Diagnosis present

## 2015-05-30 DIAGNOSIS — M797 Fibromyalgia: Secondary | ICD-10-CM | POA: Diagnosis not present

## 2015-05-30 DIAGNOSIS — K219 Gastro-esophageal reflux disease without esophagitis: Secondary | ICD-10-CM | POA: Diagnosis present

## 2015-05-30 DIAGNOSIS — Z87891 Personal history of nicotine dependence: Secondary | ICD-10-CM

## 2015-05-30 DIAGNOSIS — M109 Gout, unspecified: Secondary | ICD-10-CM | POA: Diagnosis present

## 2015-05-30 DIAGNOSIS — M10072 Idiopathic gout, left ankle and foot: Secondary | ICD-10-CM | POA: Diagnosis not present

## 2015-05-30 DIAGNOSIS — E876 Hypokalemia: Secondary | ICD-10-CM | POA: Diagnosis not present

## 2015-05-30 DIAGNOSIS — I10 Essential (primary) hypertension: Secondary | ICD-10-CM | POA: Diagnosis present

## 2015-05-30 DIAGNOSIS — M1711 Unilateral primary osteoarthritis, right knee: Secondary | ICD-10-CM | POA: Diagnosis present

## 2015-05-30 DIAGNOSIS — Z881 Allergy status to other antibiotic agents status: Secondary | ICD-10-CM | POA: Diagnosis not present

## 2015-05-30 DIAGNOSIS — Z888 Allergy status to other drugs, medicaments and biological substances status: Secondary | ICD-10-CM | POA: Diagnosis not present

## 2015-05-30 DIAGNOSIS — Z6841 Body Mass Index (BMI) 40.0 and over, adult: Secondary | ICD-10-CM | POA: Diagnosis not present

## 2015-05-30 DIAGNOSIS — R0600 Dyspnea, unspecified: Secondary | ICD-10-CM | POA: Insufficient documentation

## 2015-05-30 DIAGNOSIS — E662 Morbid (severe) obesity with alveolar hypoventilation: Secondary | ICD-10-CM | POA: Diagnosis present

## 2015-05-30 DIAGNOSIS — Z794 Long term (current) use of insulin: Secondary | ICD-10-CM | POA: Diagnosis not present

## 2015-05-30 DIAGNOSIS — E119 Type 2 diabetes mellitus without complications: Secondary | ICD-10-CM | POA: Diagnosis not present

## 2015-05-30 DIAGNOSIS — G4733 Obstructive sleep apnea (adult) (pediatric): Secondary | ICD-10-CM | POA: Diagnosis not present

## 2015-05-30 DIAGNOSIS — Z7982 Long term (current) use of aspirin: Secondary | ICD-10-CM | POA: Diagnosis not present

## 2015-05-30 DIAGNOSIS — I5031 Acute diastolic (congestive) heart failure: Secondary | ICD-10-CM

## 2015-05-30 DIAGNOSIS — D869 Sarcoidosis, unspecified: Secondary | ICD-10-CM

## 2015-05-30 DIAGNOSIS — Z8249 Family history of ischemic heart disease and other diseases of the circulatory system: Secondary | ICD-10-CM

## 2015-05-30 DIAGNOSIS — Z79899 Other long term (current) drug therapy: Secondary | ICD-10-CM

## 2015-05-30 DIAGNOSIS — Z885 Allergy status to narcotic agent status: Secondary | ICD-10-CM

## 2015-05-30 DIAGNOSIS — Z823 Family history of stroke: Secondary | ICD-10-CM | POA: Diagnosis not present

## 2015-05-30 DIAGNOSIS — N179 Acute kidney failure, unspecified: Secondary | ICD-10-CM | POA: Diagnosis not present

## 2015-05-30 DIAGNOSIS — G8929 Other chronic pain: Secondary | ICD-10-CM | POA: Diagnosis present

## 2015-05-30 DIAGNOSIS — Z825 Family history of asthma and other chronic lower respiratory diseases: Secondary | ICD-10-CM

## 2015-05-30 DIAGNOSIS — N182 Chronic kidney disease, stage 2 (mild): Secondary | ICD-10-CM | POA: Diagnosis present

## 2015-05-30 DIAGNOSIS — Z9981 Dependence on supplemental oxygen: Secondary | ICD-10-CM

## 2015-05-30 DIAGNOSIS — M25461 Effusion, right knee: Secondary | ICD-10-CM | POA: Diagnosis not present

## 2015-05-30 DIAGNOSIS — E1165 Type 2 diabetes mellitus with hyperglycemia: Secondary | ICD-10-CM | POA: Diagnosis present

## 2015-05-30 DIAGNOSIS — I9589 Other hypotension: Secondary | ICD-10-CM | POA: Diagnosis present

## 2015-05-30 DIAGNOSIS — K760 Fatty (change of) liver, not elsewhere classified: Secondary | ICD-10-CM | POA: Diagnosis not present

## 2015-05-30 DIAGNOSIS — Z833 Family history of diabetes mellitus: Secondary | ICD-10-CM

## 2015-05-30 DIAGNOSIS — Z720 Tobacco use: Secondary | ICD-10-CM | POA: Diagnosis present

## 2015-05-30 DIAGNOSIS — D860 Sarcoidosis of lung: Secondary | ICD-10-CM | POA: Diagnosis present

## 2015-05-30 DIAGNOSIS — I270 Primary pulmonary hypertension: Secondary | ICD-10-CM | POA: Diagnosis not present

## 2015-05-30 DIAGNOSIS — M25469 Effusion, unspecified knee: Secondary | ICD-10-CM | POA: Diagnosis present

## 2015-05-30 DIAGNOSIS — I509 Heart failure, unspecified: Secondary | ICD-10-CM | POA: Insufficient documentation

## 2015-05-30 DIAGNOSIS — I129 Hypertensive chronic kidney disease with stage 1 through stage 4 chronic kidney disease, or unspecified chronic kidney disease: Secondary | ICD-10-CM | POA: Diagnosis not present

## 2015-05-30 DIAGNOSIS — Z91040 Latex allergy status: Secondary | ICD-10-CM

## 2015-05-30 DIAGNOSIS — J449 Chronic obstructive pulmonary disease, unspecified: Secondary | ICD-10-CM | POA: Diagnosis not present

## 2015-05-30 DIAGNOSIS — IMO0002 Reserved for concepts with insufficient information to code with codable children: Secondary | ICD-10-CM | POA: Diagnosis present

## 2015-05-30 DIAGNOSIS — R0602 Shortness of breath: Secondary | ICD-10-CM | POA: Diagnosis not present

## 2015-05-30 DIAGNOSIS — M7989 Other specified soft tissue disorders: Secondary | ICD-10-CM | POA: Diagnosis not present

## 2015-05-30 DIAGNOSIS — J9620 Acute and chronic respiratory failure, unspecified whether with hypoxia or hypercapnia: Secondary | ICD-10-CM | POA: Diagnosis present

## 2015-05-30 LAB — BASIC METABOLIC PANEL
Anion gap: 12 (ref 5–15)
BUN: 18 mg/dL (ref 6–20)
CHLORIDE: 89 mmol/L — AB (ref 101–111)
CO2: 31 mmol/L (ref 22–32)
Calcium: 9.3 mg/dL (ref 8.9–10.3)
Creatinine, Ser: 1.44 mg/dL — ABNORMAL HIGH (ref 0.44–1.00)
GFR calc non Af Amer: 42 mL/min — ABNORMAL LOW (ref >60)
GFR, EST AFRICAN AMERICAN: 49 mL/min — AB (ref >60)
Glucose, Bld: 291 mg/dL — ABNORMAL HIGH (ref 65–99)
POTASSIUM: 3.2 mmol/L — AB (ref 3.5–5.1)
SODIUM: 132 mmol/L — AB (ref 135–145)

## 2015-05-30 LAB — CBC
HEMATOCRIT: 38.5 % (ref 36.0–46.0)
HEMOGLOBIN: 12.6 g/dL (ref 12.0–15.0)
MCH: 25.9 pg — ABNORMAL LOW (ref 26.0–34.0)
MCHC: 32.7 g/dL (ref 30.0–36.0)
MCV: 79.2 fL (ref 78.0–100.0)
Platelets: 176 10*3/uL (ref 150–400)
RBC: 4.86 MIL/uL (ref 3.87–5.11)
RDW: 17.9 % — AB (ref 11.5–15.5)
WBC: 7.2 10*3/uL (ref 4.0–10.5)

## 2015-05-30 LAB — D-DIMER, QUANTITATIVE (NOT AT ARMC): D DIMER QUANT: 0.45 ug{FEU}/mL (ref 0.00–0.48)

## 2015-05-30 LAB — SEDIMENTATION RATE: SED RATE: 31 mm/h — AB (ref 0–22)

## 2015-05-30 LAB — URIC ACID: URIC ACID, SERUM: 14.2 mg/dL — AB (ref 2.3–6.6)

## 2015-05-30 LAB — GLUCOSE, CAPILLARY: GLUCOSE-CAPILLARY: 340 mg/dL — AB (ref 65–99)

## 2015-05-30 LAB — TROPONIN I

## 2015-05-30 LAB — CBG MONITORING, ED: Glucose-Capillary: 212 mg/dL — ABNORMAL HIGH (ref 65–99)

## 2015-05-30 LAB — BRAIN NATRIURETIC PEPTIDE: B Natriuretic Peptide: 24 pg/mL (ref 0.0–100.0)

## 2015-05-30 MED ORDER — TIZANIDINE HCL 2 MG PO TABS
2.0000 mg | ORAL_TABLET | Freq: Two times a day (BID) | ORAL | Status: DC | PRN
  Filled 2015-05-30: qty 1

## 2015-05-30 MED ORDER — SPIRONOLACTONE 25 MG PO TABS
25.0000 mg | ORAL_TABLET | Freq: Two times a day (BID) | ORAL | Status: DC
  Administered 2015-05-31 – 2015-06-01 (×4): 25 mg via ORAL
  Filled 2015-05-30 (×4): qty 1

## 2015-05-30 MED ORDER — SODIUM CHLORIDE 0.9 % IJ SOLN: 3.0000 mL | INTRAMUSCULAR | Status: DC | PRN

## 2015-05-30 MED ORDER — METOLAZONE 5 MG PO TABS
5.0000 mg | ORAL_TABLET | Freq: Two times a day (BID) | ORAL | Status: DC
  Administered 2015-05-31: 5 mg via ORAL
  Filled 2015-05-30: qty 1

## 2015-05-30 MED ORDER — HYDROMORPHONE HCL 1 MG/ML IJ SOLN
0.5000 mg | INTRAMUSCULAR | Status: DC | PRN
  Administered 2015-05-30 – 2015-06-01 (×7): 1 mg via INTRAVENOUS
  Administered 2015-06-01: 0.5 mg via INTRAVENOUS
  Administered 2015-06-02 – 2015-06-03 (×8): 1 mg via INTRAVENOUS
  Filled 2015-05-30 (×17): qty 1

## 2015-05-30 MED ORDER — INSULIN ASPART 100 UNIT/ML ~~LOC~~ SOLN
0.0000 [IU] | Freq: Every day | SUBCUTANEOUS | Status: DC
  Administered 2015-05-30: 4 [IU] via SUBCUTANEOUS
  Administered 2015-05-31 – 2015-06-01 (×2): 2 [IU] via SUBCUTANEOUS

## 2015-05-30 MED ORDER — IPRATROPIUM-ALBUTEROL 0.5-2.5 (3) MG/3ML IN SOLN
3.0000 mL | RESPIRATORY_TRACT | Status: DC
  Administered 2015-05-31 (×2): 3 mL via RESPIRATORY_TRACT
  Filled 2015-05-30 (×2): qty 3

## 2015-05-30 MED ORDER — IPRATROPIUM-ALBUTEROL 0.5-2.5 (3) MG/3ML IN SOLN: 3.0000 mL | Freq: Four times a day (QID) | RESPIRATORY_TRACT | Status: DC

## 2015-05-30 MED ORDER — HYDROMORPHONE HCL 1 MG/ML IJ SOLN
1.0000 mg | Freq: Once | INTRAMUSCULAR | Status: AC
  Administered 2015-05-30: 1 mg via INTRAVENOUS
  Filled 2015-05-30: qty 1

## 2015-05-30 MED ORDER — POTASSIUM CHLORIDE CRYS ER 20 MEQ PO TBCR
40.0000 meq | EXTENDED_RELEASE_TABLET | Freq: Three times a day (TID) | ORAL | Status: AC
  Administered 2015-05-30 – 2015-06-01 (×6): 40 meq via ORAL
  Filled 2015-05-30 (×6): qty 2

## 2015-05-30 MED ORDER — INSULIN ASPART 100 UNIT/ML ~~LOC~~ SOLN
0.0000 [IU] | Freq: Three times a day (TID) | SUBCUTANEOUS | Status: DC
  Administered 2015-05-31: 9 [IU] via SUBCUTANEOUS
  Administered 2015-05-31: 3 [IU] via SUBCUTANEOUS
  Administered 2015-05-31: 9 [IU] via SUBCUTANEOUS
  Administered 2015-06-01 (×2): 2 [IU] via SUBCUTANEOUS

## 2015-05-30 MED ORDER — ALPRAZOLAM 0.5 MG PO TABS
1.0000 mg | ORAL_TABLET | Freq: Three times a day (TID) | ORAL | Status: DC
  Administered 2015-05-31 – 2015-06-03 (×12): 1 mg via ORAL
  Filled 2015-05-30 (×12): qty 2

## 2015-05-30 MED ORDER — IPRATROPIUM-ALBUTEROL 0.5-2.5 (3) MG/3ML IN SOLN
3.0000 mL | Freq: Once | RESPIRATORY_TRACT | Status: AC
  Administered 2015-05-30: 3 mL via RESPIRATORY_TRACT
  Filled 2015-05-30: qty 3

## 2015-05-30 MED ORDER — GABAPENTIN 300 MG PO CAPS: 300.0000 mg | ORAL_CAPSULE | Freq: Every day | ORAL | Status: DC | PRN

## 2015-05-30 MED ORDER — TADALAFIL (PAH) 20 MG PO TABS
1.0000 | ORAL_TABLET | Freq: Every day | ORAL | Status: DC
  Administered 2015-05-31 – 2015-06-03 (×4): 20 mg via ORAL
  Filled 2015-05-30 (×4): qty 1

## 2015-05-30 MED ORDER — COLCHICINE 0.6 MG PO TABS
0.6000 mg | ORAL_TABLET | Freq: Two times a day (BID) | ORAL | Status: DC
  Administered 2015-05-30 – 2015-06-03 (×8): 0.6 mg via ORAL
  Filled 2015-05-30 (×8): qty 1

## 2015-05-30 MED ORDER — CARVEDILOL 3.125 MG PO TABS
3.1250 mg | ORAL_TABLET | Freq: Two times a day (BID) | ORAL | Status: DC
  Administered 2015-05-31 – 2015-06-01 (×4): 3.125 mg via ORAL
  Filled 2015-05-30 (×4): qty 1

## 2015-05-30 MED ORDER — INFLUENZA VAC SPLIT QUAD 0.5 ML IM SUSY
0.5000 mL | PREFILLED_SYRINGE | INTRAMUSCULAR | Status: AC
  Administered 2015-05-31: 0.5 mL via INTRAMUSCULAR
  Filled 2015-05-30: qty 0.5

## 2015-05-30 MED ORDER — SODIUM CHLORIDE 0.9 % IJ SOLN
3.0000 mL | Freq: Two times a day (BID) | INTRAMUSCULAR | Status: DC
  Administered 2015-05-30 – 2015-05-31 (×2): 3 mL via INTRAVENOUS

## 2015-05-30 MED ORDER — BUMETANIDE 0.25 MG/ML IJ SOLN
2.0000 mg | Freq: Two times a day (BID) | INTRAMUSCULAR | Status: DC
  Administered 2015-05-31 (×2): 2 mg via INTRAVENOUS
  Filled 2015-05-30 (×4): qty 8

## 2015-05-30 MED ORDER — ASPIRIN EC 325 MG PO TBEC
325.0000 mg | DELAYED_RELEASE_TABLET | Freq: Every day | ORAL | Status: DC
  Administered 2015-05-31 – 2015-06-03 (×4): 325 mg via ORAL
  Filled 2015-05-30 (×4): qty 1

## 2015-05-30 MED ORDER — INSULIN GLARGINE 100 UNIT/ML ~~LOC~~ SOLN
72.0000 [IU] | Freq: Two times a day (BID) | SUBCUTANEOUS | Status: DC
  Administered 2015-05-31 – 2015-06-02 (×6): 72 [IU] via SUBCUTANEOUS
  Filled 2015-05-30 (×7): qty 0.72

## 2015-05-30 MED ORDER — ASPIRIN EC 81 MG PO TBEC: 81.0000 mg | DELAYED_RELEASE_TABLET | Freq: Every day | ORAL | Status: DC

## 2015-05-30 MED ORDER — ADULT MULTIVITAMIN W/MINERALS CH
1.0000 | ORAL_TABLET | ORAL | Status: DC
  Administered 2015-05-31: 1 via ORAL
  Filled 2015-05-30: qty 1

## 2015-05-30 MED ORDER — LISINOPRIL 2.5 MG PO TABS
2.5000 mg | ORAL_TABLET | Freq: Every day | ORAL | Status: DC
  Administered 2015-05-31 – 2015-06-01 (×2): 2.5 mg via ORAL
  Filled 2015-05-30 (×2): qty 1

## 2015-05-30 MED ORDER — OXYCODONE HCL 5 MG PO TABS
10.0000 mg | ORAL_TABLET | Freq: Once | ORAL | Status: AC
  Administered 2015-05-30: 10 mg via ORAL
  Filled 2015-05-30: qty 2

## 2015-05-30 MED ORDER — HYDROXYZINE HCL 10 MG PO TABS: 10.0000 mg | ORAL_TABLET | Freq: Three times a day (TID) | ORAL | Status: DC | PRN

## 2015-05-30 MED ORDER — SODIUM CHLORIDE 0.9 % IV SOLN: 250.0000 mL | INTRAVENOUS | Status: DC | PRN

## 2015-05-30 MED ORDER — POTASSIUM CHLORIDE CRYS ER 20 MEQ PO TBCR: 40.0000 meq | EXTENDED_RELEASE_TABLET | Freq: Once | ORAL | Status: DC

## 2015-05-30 MED ORDER — METHYLPREDNISOLONE SODIUM SUCC 125 MG IJ SOLR
125.0000 mg | Freq: Once | INTRAMUSCULAR | Status: AC
  Administered 2015-05-30: 125 mg via INTRAVENOUS
  Filled 2015-05-30: qty 2

## 2015-05-30 MED ORDER — OMEGA-3-ACID ETHYL ESTERS 1 G PO CAPS
1.0000 | ORAL_CAPSULE | Freq: Every day | ORAL | Status: DC
  Administered 2015-05-31 – 2015-06-03 (×4): 1 g via ORAL
  Filled 2015-05-30 (×7): qty 1

## 2015-05-30 MED ORDER — PANTOPRAZOLE SODIUM 40 MG PO TBEC
40.0000 mg | DELAYED_RELEASE_TABLET | Freq: Two times a day (BID) | ORAL | Status: DC
  Administered 2015-05-30 – 2015-06-03 (×8): 40 mg via ORAL
  Filled 2015-05-30 (×8): qty 1

## 2015-05-30 MED ORDER — BUPROPION HCL ER (XL) 150 MG PO TB24
150.0000 mg | ORAL_TABLET | Freq: Every day | ORAL | Status: DC
  Administered 2015-05-31 – 2015-06-03 (×4): 150 mg via ORAL
  Filled 2015-05-30 (×5): qty 1

## 2015-05-30 MED ORDER — ONDANSETRON HCL 4 MG/2ML IJ SOLN
4.0000 mg | Freq: Four times a day (QID) | INTRAMUSCULAR | Status: DC | PRN
  Administered 2015-05-31: 4 mg via INTRAVENOUS
  Filled 2015-05-30: qty 2

## 2015-05-30 MED ORDER — ENOXAPARIN SODIUM 40 MG/0.4ML ~~LOC~~ SOLN
40.0000 mg | SUBCUTANEOUS | Status: DC
  Administered 2015-05-31: 40 mg via SUBCUTANEOUS
  Filled 2015-05-30 (×2): qty 0.4

## 2015-05-30 MED ORDER — FUROSEMIDE 10 MG/ML IJ SOLN
40.0000 mg | Freq: Once | INTRAMUSCULAR | Status: AC
  Administered 2015-05-30: 40 mg via INTRAVENOUS
  Filled 2015-05-30: qty 4

## 2015-05-30 MED ORDER — ACETAMINOPHEN 325 MG PO TABS
650.0000 mg | ORAL_TABLET | ORAL | Status: DC | PRN
  Administered 2015-05-31 – 2015-06-02 (×3): 650 mg via ORAL
  Filled 2015-05-30 (×3): qty 2

## 2015-05-30 MED ORDER — MACITENTAN 10 MG PO TABS
10.0000 mg | ORAL_TABLET | Freq: Every day | ORAL | Status: DC
  Administered 2015-05-31 – 2015-06-03 (×4): 10 mg via ORAL
  Filled 2015-05-30 (×22): qty 1

## 2015-05-30 NOTE — ED Notes (Signed)
Pt initially came in c/o L ankle pain.  Was recently admitted for gout to L ankle.  She also states increased sob since yesterday and 6 lb weight since Wed.

## 2015-05-30 NOTE — ED Provider Notes (Signed)
CSN: [id number]     Arrival date & time 05/30/15  1454 History   First MD Initiated Contact with Patient 05/30/15 1625     Chief Complaint  Patient presents with  . Ankle Pain  . Shortness of Breath     (Consider location/radiation/quality/duration/timing/severity/associated sxs/prior Treatment) HPI Comments: Patient initially presented with left ankle and right knee pain which she feels is due to gout or arthritis. She states her breathing is worse because of the pain in her joints. She took hydrocodone at home without relief. Denies any trauma or fever. She reports she's had an increase of 6 pounds since she saw her cardiologist 5 days ago. She is scheduled for right heart catheterization in 2 days. She had increased her oxygen from 4 L to 6 L and feels like her breathing is more labored. Denies any chest pain. Her cough is productive of clear mucus. Denies any fever, abdominal pain, nausea or vomiting.patient with a history of COPD, sleep apnea, CHF on home oxygen 4 L at all times. She increased her torsemide to 100 mg twice daily from 60 mg twice daily 1 week ago.  Patient is a 49 y.o. female presenting with shortness of breath. The history is provided by the patient.  Shortness of Breath Associated symptoms: cough   Associated symptoms: no abdominal pain, no chest pain, no fever, no headaches and no vomiting     Past Medical History  Diagnosis Date  . Hypertension   . Hyperlipidemia   . Chronic headache   . Fibromyalgia     daily narcotics  . Anxiety     hx chronic BZ use, stopped 07/2010  . Pulmonary sarcoidosis     unimpressive CT chest 2011  . Colonic polyp   . GERD (gastroesophageal reflux disease)   . ALLERGIC RHINITIS   . Asthma   . CHF (congestive heart failure)     Diastolic with fluid overload, May, 2012, LVEF 60%  . Morbid obesity   . Depression   . Panic attacks   . Diabetes mellitus   . COPD (chronic obstructive pulmonary disease)     on home O2, moderate  airflow obstruction, suspect d/t emphysema  . Obesity   . Elevated LFTs 09/2011  . Ovarian cyst   . Chronic back pain   . Sleep apnea      CPAP  . Fatty liver   . Esophagitis   . Gastritis   . Internal hemorrhoids   . Adenomatous colon polyp 12/03/09    [facility] in [place], [place]  . Hypoxia 03/2015  . Shortness of breath dyspnea   . Pneumonia    Past Surgical History  Procedure Laterality Date  . Polypectomy  2011  . Lumbar microdiscectomy  07/06/2011    R L4-5, stern  . Back surgery    . Carpal tunnel release    . Steroid spinal injections    . Hysteroscopy w/d&c N/A 03/25/2013    Procedure: DILATATION AND CURETTAGE /HYSTEROSCOPY;  Surgeon: [name], MD;  Location: [facility];  Service: Gynecology;  Laterality: N/A;  . Dilation and curettage of uterus    . Cardiac catheterization N/A 03/17/2015    Procedure: Right Heart Cath;  Surgeon: Larey Dresser, MD;  Location: [facility];  Service: Cardiovascular;  Laterality: N/A;   Family History  Problem Relation Age of Onset  . Hypertension Mother   . Heart disease Mother   . Emphysema Father   . Hypertension Father   . Stomach  cancer Father   . Heart disease Father   . Allergies Brother   . Stomach cancer Brother   . Congestive Heart Failure Brother   . Diabetes type II Brother   . Heart disease Sister   . Diabetes type II Sister   . Heart attack Sister   . Stroke Sister   . Hypertension Sister   . Heart failure Sister     diastolic  . Hypertension Sister    Social History  Substance Use Topics  . Smoking status: Former Smoker -- 0.25 packs/day for 29 years    Types: Cigarettes    Quit date: 11/24/2014  . Smokeless tobacco: Never Used  . Alcohol Use: No     Comment: occasionally   OB History    No data available     Review of Systems  Constitutional: Positive for activity change and appetite change. Negative for fever.  HENT: Negative for congestion and rhinorrhea.   Eyes: Negative for visual  disturbance.  Respiratory: Positive for cough, chest tightness and shortness of breath.   Cardiovascular: Negative for chest pain and leg swelling.  Gastrointestinal: Negative for nausea, vomiting and abdominal pain.  Genitourinary: Negative for dysuria, hematuria, vaginal bleeding and vaginal discharge.  Musculoskeletal: Negative for myalgias and arthralgias.  Skin: Negative for wound.  Neurological: Negative for dizziness, weakness and headaches.  A complete 10 system review of systems was obtained and all systems are negative except as noted in the HPI and PMH.      Allergies  Simvastatin; Sulfamethoxazole-trimethoprim; Zolpidem tartrate; Azithromycin; Ceftin; Doxycycline; Latex; Metformin and related; Metronidazole; Morphine-naltrexone; Ciprofloxacin; and Tramadol  Home Medications   Prior to Admission medications   Medication Sig Start Date End Date Taking? Authorizing Provider  albuterol (PROAIR HFA) 108 (90 BASE) MCG/ACT inhaler Inhale 2 puffs into the lungs every 6 (six) hours as needed for wheezing or shortness of breath. 05/01/15  Yes Rowe Clack, MD  albuterol (PROVENTIL) (2.5 MG/3ML) 0.083% nebulizer solution INHALE CONTENTS OF 1 VIAL VIA NEBULIZER EVERY 6 HOURS AS NEEDED FOR WHEEZING OR SHORTNESS OF BREATH 03/30/15  Yes Rowe Clack, MD  ALPRAZolam Duanne Moron) 1 MG tablet TAKE 1 TABLET BY MOUTH THREE TIMES DAILY 05/14/15  Yes Olga Millers, MD  aspirin EC 81 MG tablet Take 81 mg by mouth daily.   Yes Historical Provider, MD  BIOTIN PO Take 1 capsule by mouth daily.   Yes Historical Provider, MD  buPROPion (WELLBUTRIN XL) 150 MG 24 hr tablet Take 1 tablet (150 mg total) by mouth daily. 04/23/15  Yes Eugenie Filler, MD  cetirizine (ZYRTEC) 1 MG/ML syrup Take 10 mLs (10 mg total) by mouth at bedtime. 04/26/15  Yes Rowe Clack, MD  diclofenac sodium (VOLTAREN) 1 % GEL Apply 4 g topically 4 (four) times daily as needed (knee pain).   Yes Historical Provider, MD   esomeprazole (NEXIUM) 40 MG capsule Take 40 mg by mouth 2 (two) times daily before a meal.   Yes Historical Provider, MD  fluticasone (FLONASE) 50 MCG/ACT nasal spray Place 1 spray into both nostrils daily as needed for allergies or rhinitis.   Yes Historical Provider, MD  gabapentin (NEURONTIN) 300 MG capsule Take 300 mg by mouth daily as needed (neuropathy pain).    Yes Historical Provider, MD  glimepiride (AMARYL) 1 MG tablet Take 1 mg by mouth daily with breakfast.   Yes Historical Provider, MD  HYDROcodone-acetaminophen (NORCO/VICODIN) 5-325 MG per tablet Take 1 tablet by mouth 4 (four) times  daily.    Yes Historical Provider, MD  hydrOXYzine (ATARAX/VISTARIL) 10 MG tablet TAKE 1 TABLET BY MOUTH THREE TIMES DAILY AS NEEDED FOR ITCHING 08/27/14  Yes Biagio Borg, MD  insulin aspart (NOVOLOG FLEXPEN) 100 UNIT/ML FlexPen Inject 6-10 Units into the skin 3 (three) times daily with meals. Sliding Scale: 8 units = 250 - 300, 10 units = 301 and above   Yes Historical Provider, MD  Insulin Glargine (LANTUS SOLOSTAR) 100 UNIT/ML Solostar Pen Inject 72 Units into the skin 2 (two) times daily. 05/18/15  Yes Rowe Clack, MD  Liraglutide (VICTOZA) 18 MG/3ML SOPN Inject 3 mLs into the skin daily.   Yes Historical Provider, MD  Macitentan 10 MG TABS Take 10 mg by mouth daily. 04/11/15  Yes Barton Dubois, MD  metolazone (ZAROXOLYN) 2.5 MG tablet Take 1 tablet (2.5 mg total) by mouth 3 (three) times a week. Every Jory Sims and Sat 05/11/15  Yes Larey Dresser, MD  Multiple Vitamin (MULTIVITAMIN) capsule Take 1 capsule by mouth once a week. Mondays   Yes Historical Provider, MD  omega-3 fish oil (MAXEPA) 1000 MG CAPS capsule Take 1 capsule by mouth daily.   Yes Historical Provider, MD  potassium chloride SA (K-DUR,KLOR-CON) 20 MEQ tablet Take 60 meq (3 tablets) in the morning and 40 meq (2 tablets) in the evening, take an extra 40 meq with Metolazone 05/11/15  Yes Larey Dresser, MD  spironolactone (ALDACTONE)  25 MG tablet Take 25 mg by mouth 2 (two) times daily.   Yes Historical Provider, MD  Tadalafil, PAH, (ADCIRCA) 20 MG TABS Take 1 tablet by mouth daily.   Yes Historical Provider, MD  tiZANidine (ZANAFLEX) 2 MG tablet Take 2 mg by mouth 2 (two) times daily as needed for muscle spasms.  03/10/15  Yes Historical Provider, MD  torsemide (DEMADEX) 100 MG tablet Take 1 tablet (100 mg total) by mouth 2 (two) times daily. 05/17/15  Yes Rowe Clack, MD  varenicline (CHANTIX) 0.5 MG tablet Take 0.5 mg by mouth every other day.   Yes Historical Provider, MD   BP 149/72 mmHg  Pulse 94  Temp(Src) 98.4 F (36.9 C) (Oral)  Resp 22  Ht 5\' 8"  (1.727 m)  Wt 293 lb 14 oz (133.3 kg)  BMI 44.69 kg/m2  SpO2 95%  LMP 10/16/2012 Physical Exam  Constitutional: She is oriented to person, place, and time. She appears well-developed and well-nourished. She appears distressed.  morbidly obese, chronically ill-appearing, mildly increased work of breathing  HENT:  Head: Normocephalic and atraumatic.  Mouth/Throat: Oropharynx is clear and moist. No oropharyngeal exudate.  Eyes: Conjunctivae and EOM are normal. Pupils are equal, round, and reactive to light.  Neck: Normal range of motion. Neck supple.  No meningismus.  Cardiovascular: Normal rate, normal heart sounds and intact distal pulses.   No murmur heard. Irregular rhythm  Pulmonary/Chest: Effort normal and breath sounds normal. No respiratory distress. She exhibits no tenderness.  Decreased breath sounds  Abdominal: Soft. There is no tenderness. There is no rebound and no guarding.  Musculoskeletal: Normal range of motion. She exhibits tenderness. She exhibits no edema.  Moderate right knee effusion, flexion and extension intact. No erythema or warmth. Intact DP and PT pulses. Diffuse edema to left ankle. Intact range of motion. Intact DP and PT pulse. No erythema or warmth.  Neurological: She is alert and oriented to person, place, and time. No cranial  nerve deficit. She exhibits normal muscle tone. Coordination normal.  No ataxia  on finger to nose bilaterally. No pronator drift. 5/5 strength throughout. CN 2-12 intact.Equal grip strength. Sensation intact.   Skin: Skin is warm.  Psychiatric: She has a normal mood and affect. Her behavior is normal.  Nursing note and vitals reviewed.   ED Course  Procedures (including critical care time) Labs Review Labs Reviewed  BASIC METABOLIC PANEL - Abnormal; Notable for the following:    Sodium 132 (*)    Potassium 3.2 (*)    Chloride 89 (*)    Glucose, Bld 291 (*)    Creatinine, Ser 1.44 (*)    GFR calc non Af Amer 42 (*)    GFR calc Af Amer 49 (*)    All other components within normal limits  CBC - Abnormal; Notable for the following:    MCH 25.9 (*)    RDW 17.9 (*)    All other components within normal limits  SEDIMENTATION RATE - Abnormal; Notable for the following:    Sed Rate 31 (*)    All other components within normal limits  URIC ACID - Abnormal; Notable for the following:    Uric Acid, Serum 14.2 (*)    All other components within normal limits  GLUCOSE, CAPILLARY - Abnormal; Notable for the following:    Glucose-Capillary 340 (*)    All other components within normal limits  CBG MONITORING, ED - Abnormal; Notable for the following:    Glucose-Capillary 212 (*)    All other components within normal limits  MRSA PCR SCREENING  TROPONIN I  BRAIN NATRIURETIC PEPTIDE  D-DIMER, QUANTITATIVE (NOT AT [facility])  BASIC METABOLIC PANEL  TROPONIN I  TROPONIN I  TROPONIN I  HEMOGLOBIN A1C    Imaging Review Dg Chest 2 View  05/30/2015   CLINICAL DATA:  Asthma, COPD.  Emphysema.  EXAM: CHEST  2 VIEW  COMPARISON:  Radiograph 05/26/2015, CT 04/19/2015  FINDINGS: Normal cardiac silhouette. There is linear interstitial markings not changed from prior. No focal consolidation. No pneumothorax.  IMPRESSION: No change in a linear interstitial markings suggesting a combination interstitial  edema and chronic interstitial lung disease.   Electronically Signed   By: Suzy Bouchard M.D.   On: 05/30/2015 15:57   Dg Ankle Complete Left  05/30/2015   CLINICAL DATA:  49 year old female with left ankle pain since yesterday evening.  EXAM: LEFT ANKLE COMPLETE - 3+ VIEW  COMPARISON:  04/19/2015  FINDINGS: Multiple views of the left ankle demonstrate no acute displaced fracture, subluxation or dislocation. Diffuse soft tissue swelling around the ankle joint.  IMPRESSION: Diffuse soft tissue swelling around the left ankle joint, without acute bony abnormality.   Electronically Signed   By: [name] M.D.   On: 05/30/2015 15:56   Dg Knee Complete 4 Views Right  05/30/2015   CLINICAL DATA:  Knee pain.  EXAM: RIGHT KNEE - COMPLETE 4+ VIEW  COMPARISON:  None.  FINDINGS: No acute fracture or dislocation. Tricompartmental osteoarthritis of the right knee. Large joint effusion. No lytic or sclerotic osseous lesion.  IMPRESSION: 1. Tricompartmental osteoarthritis of the right knee. 2. Large joint effusion.   Electronically Signed   By: Kathreen Devoid   On: 05/30/2015 17:18   I have personally reviewed and evaluated these images and lab results as part of my medical decision-making.   EKG Interpretation   Date/Time:  Sunday May 30 2015 16:41:23 EDT Ventricular Rate:  95 PR Interval:  154 QRS Duration: 96 QT Interval:  375 QTC Calculation: 471 R Axis:  65 Text Interpretation:  Ectopic atrial rhythm Atrial premature complex ST  elev, probable normal early repol pattern Artifact Confirmed by Wyvonnia Dusky   MD, Sargon Scouten [phone]) on 05/30/2015 6:03:28 PM      MDM   Final diagnoses:  Dyspnea  Acute diastolic congestive heart failure  2 days of right ankle and left knee pain history of osteoarthritis and gout. Worsening shortness of breath as well. Patient with history of COPD, CHF, pulmonary hypertension and sleep apnea. Denies chest pain.  No distress with minimal expiratory wheezing. She is  given a DuoNeb and steroids. Joint exam consistent with osteoarthritis versus gout. No evidence of septic joint. Range of motion of right knee and left ankle. No erythema or warmth. Effusion noted on x-ray.  Patient able to ambulate and place weight on right knee. Doubt septic joint. We'll not perform arthrocentesis at this time. Patient desaturates to 80% at her home 4 L. Suspect due to CHF. D-dimer is negative. Chest x-ray shows stable interstitial edema.  Patient will need admission for additional diuresis. She is scheduled for right heart catheterization in 2 days. Discussed with Dr. Arnoldo Morale.   Ezequiel Essex, MD 05/31/15 [phone]

## 2015-05-30 NOTE — ED Notes (Signed)
Patient crying out in pain.  Dr. Wyvonnia Dusky notified, at bedside.

## 2015-05-30 NOTE — H&P (Addendum)
[facility] Admission History and Physical       [name] LOV:[id number] DOB: 01-Nov-1965 DOA: 05/30/2015  Referring physician: EDP PCP: Gwendolyn Grant, MD  Specialists:   Chief Complaint: SOB  HPI: [name] is a 49 y.o. female with a history of Diastolic CHF, Chronic Respiratory Failure on 3-4 Liters NCO2 at home, PAH, Sarcoid, COPD, IDDM, and Gout who presents to the ED with complaints of increased SOB x 2 days , along with increased pain and swelling of her right knee and left ankle.    She desaturated to 80% with ambulation in the ED, and her NCO2 was increased to 4 liters.  She saw her cardiologist DR Aundra Dubin and reports that her Torsemide was increased to 100 mg BID this week, and that she take Metolazone 2.5 mg on MWF.   She is scheduled to have a Rt Heart Catherization on Tuesday 06/01/2015.   She was found to have evidence of Interstitial Edema on Chest X-ray, and X-rays were performed of her knee and Ankle, and revealed a moderate effusion on the Knee, and soft Tissue Swelling of the Ankle.   Her Uric Acid Level was 14.2.      Review of Systems:  Constitutional: No Weight Loss, No Weight Gain, Night Sweats, Fevers, Chills, Dizziness, Light Headedness, Fatigue, or Generalized Weakness HEENT: No Headaches, Difficulty Swallowing,Tooth/Dental Problems,Sore Throat,  No Sneezing, Rhinitis, Ear Ache, Nasal Congestion, or Post Nasal Drip,  Cardio-vascular:  No Chest pain, +Orthopnea, PND, Edema in Lower Extremities, Anasarca, Dizziness, Palpitations  Resp:   +Dyspnea, +DOE, No Productive Cough, No Non-Productive Cough, No Hemoptysis, No Wheezing.    GI: No Heartburn, Indigestion, Abdominal Pain, Nausea, Vomiting, Diarrhea, Constipation, Hematemesis, Hematochezia, Melena, Change in Bowel Habits,  Loss of Appetite  GU: No Dysuria, No Change in Color of Urine, No Urgency or Urinary Frequency, No Flank pain.  Musculoskeletal: +Left  Ankle  And Right Knee Pain and  Swelling, No Decreased Range of Motion, No Back Pain.  Neurologic: No Syncope, No Seizures, Muscle Weakness, Paresthesia, Vision Disturbance or Loss, No Diplopia, No Vertigo, No Difficulty Walking,  Skin: No Rash or Lesions. Psych: No Change in Mood or Affect, No Depression or Anxiety, No Memory loss, No Confusion, or Hallucinations   Past Medical History  Diagnosis Date  . Hypertension   . Hyperlipidemia   . Chronic headache   . Fibromyalgia     daily narcotics  . Anxiety     hx chronic BZ use, stopped 07/2010  . Anemia   . Pulmonary sarcoidosis     unimpressive CT chest 2011  . Colonic polyp   . GERD (gastroesophageal reflux disease)   . ALLERGIC RHINITIS   . Asthma   . CHF (congestive heart failure)     Diastolic with fluid overload, May, 2012, LVEF 60%  . Morbid obesity   . Depression   . Panic attacks   . Diabetes mellitus   . COPD (chronic obstructive pulmonary disease)     on home O2, moderate airflow obstruction, suspect d/t emphysema  . Obesity   . Elevated LFTs 09/2011  . Ovarian cyst   . Chronic back pain   . Sleep apnea      CPAP  . Fatty liver   . Esophagitis   . Gastritis   . Internal hemorrhoids   . Adenomatous colon polyp 12/03/09    [facility] in [place], [place]  . Hypoxia 03/2015  . Shortness of breath dyspnea  Past Surgical History  Procedure Laterality Date  . Polypectomy  2011  . Lumbar microdiscectomy  07/06/2011    R L4-5, stern  . Back surgery    . Carpal tunnel release    . Steroid spinal injections    . Hysteroscopy w/d&c N/A 03/25/2013    Procedure: DILATATION AND CURETTAGE /HYSTEROSCOPY;  Surgeon: [name], MD;  Location: [facility];  Service: Gynecology;  Laterality: N/A;  . Dilation and curettage of uterus    . Cardiac catheterization N/A 03/17/2015    Procedure: Right Heart Cath;  Surgeon: Larey Dresser, MD;  Location: [facility];  Service: Cardiovascular;  Laterality: N/A;      Prior to Admission medications    Medication Sig Start Date End Date Taking? Authorizing Provider  albuterol (PROAIR HFA) 108 (90 BASE) MCG/ACT inhaler Inhale 2 puffs into the lungs every 6 (six) hours as needed for wheezing or shortness of breath. 05/01/15  Yes Rowe Clack, MD  albuterol (PROVENTIL) (2.5 MG/3ML) 0.083% nebulizer solution INHALE CONTENTS OF 1 VIAL VIA NEBULIZER EVERY 6 HOURS AS NEEDED FOR WHEEZING OR SHORTNESS OF BREATH 03/30/15  Yes Rowe Clack, MD  ALPRAZolam Duanne Moron) 1 MG tablet TAKE 1 TABLET BY MOUTH THREE TIMES DAILY 05/14/15  Yes Olga Millers, MD  aspirin EC 81 MG tablet Take 81 mg by mouth daily.   Yes Historical Provider, MD  BIOTIN PO Take 1 capsule by mouth daily.   Yes Historical Provider, MD  buPROPion (WELLBUTRIN XL) 150 MG 24 hr tablet Take 1 tablet (150 mg total) by mouth daily. 04/23/15  Yes Eugenie Filler, MD  cetirizine (ZYRTEC) 1 MG/ML syrup Take 10 mLs (10 mg total) by mouth at bedtime. 04/26/15  Yes Rowe Clack, MD  diclofenac sodium (VOLTAREN) 1 % GEL Apply 4 g topically 4 (four) times daily as needed (knee pain).   Yes Historical Provider, MD  esomeprazole (NEXIUM) 40 MG capsule Take 40 mg by mouth 2 (two) times daily before a meal.   Yes Historical Provider, MD  fluticasone (FLONASE) 50 MCG/ACT nasal spray Place 1 spray into both nostrils daily as needed for allergies or rhinitis.   Yes Historical Provider, MD  gabapentin (NEURONTIN) 300 MG capsule Take 300 mg by mouth daily as needed (neuropathy pain).    Yes Historical Provider, MD  glimepiride (AMARYL) 1 MG tablet Take 1 mg by mouth daily with breakfast.   Yes Historical Provider, MD  HYDROcodone-acetaminophen (NORCO/VICODIN) 5-325 MG per tablet Take 1 tablet by mouth 4 (four) times daily.    Yes Historical Provider, MD  hydrOXYzine (ATARAX/VISTARIL) 10 MG tablet TAKE 1 TABLET BY MOUTH THREE TIMES DAILY AS NEEDED FOR ITCHING 08/27/14  Yes Biagio Borg, MD  insulin aspart (NOVOLOG FLEXPEN) 100 UNIT/ML FlexPen Inject 6-10  Units into the skin 3 (three) times daily with meals. Sliding Scale: 8 units = 250 - 300, 10 units = 301 and above   Yes Historical Provider, MD  Insulin Glargine (LANTUS SOLOSTAR) 100 UNIT/ML Solostar Pen Inject 72 Units into the skin 2 (two) times daily. 05/18/15  Yes Rowe Clack, MD  Liraglutide (VICTOZA) 18 MG/3ML SOPN Inject 3 mLs into the skin daily.   Yes Historical Provider, MD  Macitentan 10 MG TABS Take 10 mg by mouth daily. 04/11/15  Yes Barton Dubois, MD  metolazone (ZAROXOLYN) 2.5 MG tablet Take 1 tablet (2.5 mg total) by mouth 3 (three) times a week. Every Mon, Wed and Sat 05/11/15  Yes Dalton  Claris Gladden, MD  Multiple Vitamin (MULTIVITAMIN) capsule Take 1 capsule by mouth once a week. Mondays   Yes Historical Provider, MD  omega-3 fish oil (MAXEPA) 1000 MG CAPS capsule Take 1 capsule by mouth daily.   Yes Historical Provider, MD  potassium chloride SA (K-DUR,KLOR-CON) 20 MEQ tablet Take 60 meq (3 tablets) in the morning and 40 meq (2 tablets) in the evening, take an extra 40 meq with Metolazone 05/11/15  Yes Larey Dresser, MD  spironolactone (ALDACTONE) 25 MG tablet Take 25 mg by mouth 2 (two) times daily.   Yes Historical Provider, MD  Tadalafil, PAH, (ADCIRCA) 20 MG TABS Take 1 tablet by mouth daily.   Yes Historical Provider, MD  tiZANidine (ZANAFLEX) 2 MG tablet Take 2 mg by mouth 2 (two) times daily as needed for muscle spasms.  03/10/15  Yes Historical Provider, MD  torsemide (DEMADEX) 100 MG tablet Take 1 tablet (100 mg total) by mouth 2 (two) times daily. 05/17/15  Yes Rowe Clack, MD  varenicline (CHANTIX) 0.5 MG tablet Take 0.5 mg by mouth every other day.   Yes Historical Provider, MD     Allergies  Allergen Reactions  . Simvastatin Other (See Comments)    Severe leg pain and burning  . Sulfamethoxazole-Trimethoprim Nausea And Vomiting    Causes Projectile Vomiting  . Zolpidem Tartrate Other (See Comments)    Chest pain  . Azithromycin Other (See Comments)     Resistant to med  . Ceftin [Cefuroxime] Swelling and Other (See Comments)    MOUTH SWELLING  . Doxycycline Other (See Comments)    Resistant to med  . Latex Itching and Rash    Also burning sensations  . Metformin And Related Diarrhea    Severe diarrhea  . Metronidazole Nausea And Vomiting  . Morphine-Naltrexone Palpitations  . Ciprofloxacin Nausea Only  . Tramadol Itching and Nausea Only    Social History:  reports that she quit smoking about 6 months ago. Her smoking use included Cigarettes. She has a 7.25 pack-year smoking history. She has never used smokeless tobacco. She reports that she drinks alcohol. She reports that she uses illicit drugs (Cocaine and Marijuana).    Family History  Problem Relation Age of Onset  . Hypertension Mother   . Heart disease Mother   . Emphysema Father   . Hypertension Father   . Stomach cancer Father   . Heart disease Father   . Allergies Brother   . Stomach cancer Brother   . Congestive Heart Failure Brother   . Diabetes type II Brother   . Heart disease Sister   . Diabetes type II Sister   . Heart attack Sister   . Stroke Sister   . Hypertension Sister   . Heart failure Sister     diastolic  . Hypertension Sister        Physical Exam:  GEN:  Pleasant Chronic ill Appearing Morbidly Obese  49 y.o. African American female examined and in no acute distress; cooperative with exam Filed Vitals:   05/30/15 1830 05/30/15 1900 05/30/15 1915 05/30/15 1952  BP: 132/70 124/59 120/61 118/60  Pulse: 100 91 89 92  Temp:      TempSrc:      Resp: 24 23 17 21   Height:      Weight:      SpO2: 95% 93% 94% 93%   Blood pressure 118/60, pulse 92, temperature 98 F (36.7 C), temperature source Oral, resp. rate 21, height 5\' 8"  (1.727  m), weight 134.582 kg (296 lb 11.2 oz), last menstrual period 10/16/2012, SpO2 93 %. PSYCH: She is alert and oriented x4; does not appear anxious does not appear depressed; affect is normal HEENT: Normocephalic and  Atraumatic, Mucous membranes pink; PERRLA; EOM intact; Fundi:  Benign;  No scleral icterus, Nares: Patent, Oropharynx: Clear, Fair Dentition,    Neck:  FROM, No Cervical Lymphadenopathy nor Thyromegaly or Carotid Bruit; No JVD; Breasts:: Not examined CHEST WALL: No tenderness CHEST: Normal respiration, clear to auscultation bilaterally HEART: Regular rate and rhythm; no murmurs rubs or gallops BACK: No kyphosis or scoliosis; No CVA tenderness ABDOMEN: Positive Bowel Sounds,Obese, Soft Non-Tender, No Rebound or Guarding; No Masses, No Organomegaly Rectal Exam: Not done EXTREMITIES: No Cyanosis, Clubbing, or Edema; No Ulcerations. Genitalia: not examined PULSES: 2+ and symmetric SKIN: Numerous Scars on BLEs No ulcerations CNS:  Alert and Oriented x 4, No Focal Deficits Vascular: pulses palpable throughout    Labs on Admission:  Basic Metabolic Panel:  Recent Labs Lab 05/26/15 1211 05/30/15 1701  NA 133* 132*  K 3.3* 3.2*  CL 92* 89*  CO2 30 31  GLUCOSE 203* 291*  BUN 21* 18  CREATININE 1.39* 1.44*  CALCIUM 9.5 9.3   Liver Function Tests: No results for input(s): AST, ALT, ALKPHOS, BILITOT, PROT, ALBUMIN in the last 168 hours. No results for input(s): LIPASE, AMYLASE in the last 168 hours. No results for input(s): AMMONIA in the last 168 hours. CBC:  Recent Labs Lab 05/30/15 1701  WBC 7.2  HGB 12.6  HCT 38.5  MCV 79.2  PLT 176   Cardiac Enzymes:  Recent Labs Lab 05/30/15 1701  TROPONINI <0.03    BNP (last 3 results)  Recent Labs  04/18/15 2135 05/26/15 1212 05/30/15 1701  BNP 15.1 12.9 24.0    ProBNP (last 3 results)  Recent Labs  07/20/14 1817  PROBNP 39.7    CBG:  Recent Labs Lab 05/30/15 1933  GLUCAP 212*    Radiological Exams on Admission: Dg Chest 2 View  05/30/2015   CLINICAL DATA:  Asthma, COPD.  Emphysema.  EXAM: CHEST  2 VIEW  COMPARISON:  Radiograph 05/26/2015, CT 04/19/2015  FINDINGS: Normal cardiac silhouette. There is linear  interstitial markings not changed from prior. No focal consolidation. No pneumothorax.  IMPRESSION: No change in a linear interstitial markings suggesting a combination interstitial edema and chronic interstitial lung disease.   Electronically Signed   By: Suzy Bouchard M.D.   On: 05/30/2015 15:57   Dg Ankle Complete Left  05/30/2015   CLINICAL DATA:  49 year old female with left ankle pain since yesterday evening.  EXAM: LEFT ANKLE COMPLETE - 3+ VIEW  COMPARISON:  04/19/2015  FINDINGS: Multiple views of the left ankle demonstrate no acute displaced fracture, subluxation or dislocation. Diffuse soft tissue swelling around the ankle joint.  IMPRESSION: Diffuse soft tissue swelling around the left ankle joint, without acute bony abnormality.   Electronically Signed   By: [name] M.D.   On: 05/30/2015 15:56   Dg Knee Complete 4 Views Right  05/30/2015   CLINICAL DATA:  Knee pain.  EXAM: RIGHT KNEE - COMPLETE 4+ VIEW  COMPARISON:  None.  FINDINGS: No acute fracture or dislocation. Tricompartmental osteoarthritis of the right knee. Large joint effusion. No lytic or sclerotic osseous lesion.  IMPRESSION: 1. Tricompartmental osteoarthritis of the right knee. 2. Large joint effusion.   Electronically Signed   By: Kathreen Devoid   On: 05/30/2015 17:18     EKG: Independently  reviewed. Normal Sinus Rhythm rate =95, No Acute S-T changes   Assessment/Plan:     49 y.o. female with  Principal Problem:   1.    Acute on chronic diastolic CHF (congestive heart failure)   Metolazone 5 mg PO BID   IV Bumex 2 mg BID x 4 doses   KCl 40 meg TID x 6 doses   Continue Spironolactone   Added Carvedilol BID start in AM if BP > 100 and HR > 60   Added Lisinopril 2.5 mg PO q AM  Active Problems:   2.    Acute on chronic respiratory failure   NCO2 at 4 liters   Continuous O2 Monitoring     3.    Acute gout- Uric Acid Level =14.2   Colchicine 0.6 mg BID   Also Given IV Solumedrol IV X 1 in ED          4.     Knee effusion   Colchicine BID   May need I+D by Ortho       5.    Hypotension- Transiently due to Medications    Monitor BPs   Hold Anti-Hypotensives        6.    Hypokalemia   Replace K+   Check Magnesium Level     7.    Diabetes type 2, uncontrolled   Continue Lantusr   SSI coverage        8.    COPD (chronic obstructive pulmonary disease)   DuoNebs    Solumedrol IV X1   NCO2     9.    Pulmonary HTN   To  Have Rt Heart Cath on Tuesday   On Macitentan Rx   On Tadalifil Rx   10.    Sarcoid   Chronic   11.    Morbid obesity   Needs  Weight Loss   12.    Tobacco abuse   On Chantix Rx at home   13.    DVT Prophylaxis   Lovenox    Code Status:     FULL CODE        Family Communication:   No Family Present    Disposition Plan:    Inpatient Status        Time spent:  [facility] Pager [phone]   If [place] Please Contact the Day Rounding Team MD for [facility]  If 7PM-7AM, Please Contact Night-Floor Coverage  [email or web address] Password [email or web address] 05/30/2015, 9:19 PM     ADDENDUM:   Patient was seen and examined on 05/30/2015

## 2015-05-30 NOTE — ED Notes (Signed)
Ambulated pt w/ assistance of Stephanie.  Pt became short of breath as soon as she stood up.  O2 level decreased from 94% on 4 L of O2 to 80% on 4 L after ambulating only 20 feet.  Returned shortly to 94% after returning to bed.  Provider informed.

## 2015-05-30 NOTE — ED Notes (Addendum)
Pt transported to xray (knee)

## 2015-05-30 NOTE — ED Notes (Signed)
Attempted to give report to floor

## 2015-05-30 NOTE — ED Notes (Signed)
Bedside hand-off complete

## 2015-05-31 DIAGNOSIS — N182 Chronic kidney disease, stage 2 (mild): Secondary | ICD-10-CM | POA: Diagnosis not present

## 2015-05-31 DIAGNOSIS — I129 Hypertensive chronic kidney disease with stage 1 through stage 4 chronic kidney disease, or unspecified chronic kidney disease: Secondary | ICD-10-CM | POA: Diagnosis not present

## 2015-05-31 DIAGNOSIS — E662 Morbid (severe) obesity with alveolar hypoventilation: Secondary | ICD-10-CM | POA: Diagnosis not present

## 2015-05-31 DIAGNOSIS — Z6841 Body Mass Index (BMI) 40.0 and over, adult: Secondary | ICD-10-CM | POA: Diagnosis not present

## 2015-05-31 DIAGNOSIS — K219 Gastro-esophageal reflux disease without esophagitis: Secondary | ICD-10-CM | POA: Diagnosis not present

## 2015-05-31 DIAGNOSIS — E119 Type 2 diabetes mellitus without complications: Secondary | ICD-10-CM | POA: Diagnosis not present

## 2015-05-31 DIAGNOSIS — M1711 Unilateral primary osteoarthritis, right knee: Secondary | ICD-10-CM | POA: Diagnosis not present

## 2015-05-31 DIAGNOSIS — D860 Sarcoidosis of lung: Secondary | ICD-10-CM | POA: Diagnosis not present

## 2015-05-31 DIAGNOSIS — Z9981 Dependence on supplemental oxygen: Secondary | ICD-10-CM | POA: Diagnosis not present

## 2015-05-31 DIAGNOSIS — E1122 Type 2 diabetes mellitus with diabetic chronic kidney disease: Secondary | ICD-10-CM | POA: Diagnosis not present

## 2015-05-31 DIAGNOSIS — N179 Acute kidney failure, unspecified: Secondary | ICD-10-CM | POA: Diagnosis not present

## 2015-05-31 DIAGNOSIS — E876 Hypokalemia: Secondary | ICD-10-CM | POA: Diagnosis not present

## 2015-05-31 DIAGNOSIS — I10 Essential (primary) hypertension: Secondary | ICD-10-CM | POA: Diagnosis not present

## 2015-05-31 DIAGNOSIS — M549 Dorsalgia, unspecified: Secondary | ICD-10-CM | POA: Diagnosis not present

## 2015-05-31 DIAGNOSIS — M109 Gout, unspecified: Secondary | ICD-10-CM | POA: Diagnosis not present

## 2015-05-31 DIAGNOSIS — E1165 Type 2 diabetes mellitus with hyperglycemia: Secondary | ICD-10-CM | POA: Diagnosis not present

## 2015-05-31 DIAGNOSIS — I5033 Acute on chronic diastolic (congestive) heart failure: Secondary | ICD-10-CM | POA: Diagnosis not present

## 2015-05-31 DIAGNOSIS — K760 Fatty (change of) liver, not elsewhere classified: Secondary | ICD-10-CM | POA: Diagnosis not present

## 2015-05-31 DIAGNOSIS — E785 Hyperlipidemia, unspecified: Secondary | ICD-10-CM | POA: Diagnosis not present

## 2015-05-31 DIAGNOSIS — J45909 Unspecified asthma, uncomplicated: Secondary | ICD-10-CM | POA: Diagnosis not present

## 2015-05-31 DIAGNOSIS — M797 Fibromyalgia: Secondary | ICD-10-CM | POA: Diagnosis not present

## 2015-05-31 DIAGNOSIS — J9621 Acute and chronic respiratory failure with hypoxia: Secondary | ICD-10-CM | POA: Diagnosis not present

## 2015-05-31 DIAGNOSIS — J449 Chronic obstructive pulmonary disease, unspecified: Secondary | ICD-10-CM | POA: Diagnosis not present

## 2015-05-31 DIAGNOSIS — I959 Hypotension, unspecified: Secondary | ICD-10-CM | POA: Diagnosis not present

## 2015-05-31 DIAGNOSIS — I272 Other secondary pulmonary hypertension: Secondary | ICD-10-CM | POA: Diagnosis not present

## 2015-05-31 LAB — BASIC METABOLIC PANEL
Anion gap: 14 (ref 5–15)
Anion gap: 13 (ref 5–15)
BUN: 20 mg/dL (ref 6–20)
BUN: 21 mg/dL — AB (ref 6–20)
CALCIUM: 8.7 mg/dL — AB (ref 8.9–10.3)
CHLORIDE: 90 mmol/L — AB (ref 101–111)
CHLORIDE: 84 mmol/L — AB (ref 101–111)
CO2: 26 mmol/L (ref 22–32)
CO2: 33 mmol/L — AB (ref 22–32)
CREATININE: 1.38 mg/dL — AB (ref 0.44–1.00)
CREATININE: 1.28 mg/dL — AB (ref 0.44–1.00)
Calcium: 9.5 mg/dL (ref 8.9–10.3)
GFR calc Af Amer: 56 mL/min — ABNORMAL LOW (ref >60)
GFR calc non Af Amer: 44 mL/min — ABNORMAL LOW (ref >60)
GFR calc non Af Amer: 49 mL/min — ABNORMAL LOW (ref >60)
GFR, EST AFRICAN AMERICAN: 51 mL/min — AB (ref >60)
Glucose, Bld: 407 mg/dL — ABNORMAL HIGH (ref 65–99)
Glucose, Bld: 217 mg/dL — ABNORMAL HIGH (ref 65–99)
POTASSIUM: 3.5 mmol/L (ref 3.5–5.1)
Potassium: 3.6 mmol/L (ref 3.5–5.1)
SODIUM: 130 mmol/L — AB (ref 135–145)
SODIUM: 130 mmol/L — AB (ref 135–145)

## 2015-05-31 LAB — CBC
HEMATOCRIT: 43.1 % (ref 36.0–46.0)
Hemoglobin: 14.2 g/dL (ref 12.0–15.0)
MCH: 26.4 pg (ref 26.0–34.0)
MCHC: 32.9 g/dL (ref 30.0–36.0)
MCV: 80.1 fL (ref 78.0–100.0)
PLATELETS: 209 10*3/uL (ref 150–400)
RBC: 5.38 MIL/uL — ABNORMAL HIGH (ref 3.87–5.11)
RDW: 18.3 % — AB (ref 11.5–15.5)
WBC: 12.1 10*3/uL — AB (ref 4.0–10.5)

## 2015-05-31 LAB — MRSA PCR SCREENING: MRSA by PCR: NEGATIVE

## 2015-05-31 LAB — GLUCOSE, CAPILLARY
GLUCOSE-CAPILLARY: 203 mg/dL — AB (ref 65–99)
GLUCOSE-CAPILLARY: 391 mg/dL — AB (ref 65–99)
Glucose-Capillary: 385 mg/dL — ABNORMAL HIGH (ref 65–99)
Glucose-Capillary: 376 mg/dL — ABNORMAL HIGH (ref 65–99)
Glucose-Capillary: 203 mg/dL — ABNORMAL HIGH (ref 65–99)

## 2015-05-31 LAB — PROTIME-INR
INR: 0.95 (ref 0.00–1.49)
Prothrombin Time: 12.9 seconds (ref 11.6–15.2)

## 2015-05-31 LAB — TROPONIN I
Troponin I: <0.03 ng/mL (ref <0.031)
Troponin I: <0.03 ng/mL (ref <0.031)

## 2015-05-31 MED ORDER — TORSEMIDE 20 MG PO TABS
100.0000 mg | ORAL_TABLET | Freq: Two times a day (BID) | ORAL | Status: DC
Start: 2015-05-31 — End: 2015-06-02
  Administered 2015-05-31 – 2015-06-01 (×3): 100 mg via ORAL
  Filled 2015-05-31 (×4): qty 5

## 2015-05-31 MED ORDER — SODIUM CHLORIDE 0.9 % IV SOLN: 250.0000 mL | INTRAVENOUS | Status: DC | PRN

## 2015-05-31 MED ORDER — SODIUM CHLORIDE 0.9 % IV SOLN
INTRAVENOUS | Status: DC
Start: 2015-06-01 — End: 2015-06-01
  Administered 2015-06-01: via INTRAVENOUS

## 2015-05-31 MED ORDER — ASPIRIN 81 MG PO CHEW
81.0000 mg | CHEWABLE_TABLET | ORAL | Status: AC
  Administered 2015-06-01: 81 mg via ORAL
  Filled 2015-05-31: qty 1

## 2015-05-31 MED ORDER — METOLAZONE 5 MG PO TABS: 5.0000 mg | ORAL_TABLET | Freq: Two times a day (BID) | ORAL | Status: DC

## 2015-05-31 MED ORDER — METOLAZONE 2.5 MG PO TABS
2.5000 mg | ORAL_TABLET | Freq: Two times a day (BID) | ORAL | Status: DC
Start: 2015-05-31 — End: 2015-06-02
  Administered 2015-05-31 – 2015-06-01 (×2): 2.5 mg via ORAL
  Filled 2015-05-31 (×4): qty 1

## 2015-05-31 MED ORDER — SODIUM CHLORIDE 0.9 % IJ SOLN: 3.0000 mL | INTRAMUSCULAR | Status: DC | PRN

## 2015-05-31 MED ORDER — IPRATROPIUM-ALBUTEROL 0.5-2.5 (3) MG/3ML IN SOLN
3.0000 mL | Freq: Four times a day (QID) | RESPIRATORY_TRACT | Status: DC
  Administered 2015-05-31 – 2015-06-03 (×12): 3 mL via RESPIRATORY_TRACT
  Filled 2015-05-31 (×14): qty 3

## 2015-05-31 MED ORDER — SODIUM CHLORIDE 0.9 % IJ SOLN
3.0000 mL | Freq: Two times a day (BID) | INTRAMUSCULAR | Status: DC
  Administered 2015-05-31: 3 mL via INTRAVENOUS

## 2015-05-31 MED ORDER — LOPERAMIDE HCL 2 MG PO CAPS
2.0000 mg | ORAL_CAPSULE | ORAL | Status: DC | PRN
  Administered 2015-05-31: 2 mg via ORAL
  Filled 2015-05-31: qty 1

## 2015-05-31 NOTE — Progress Notes (Signed)
[facility] TEAM 1 - Stepdown/ICU TEAM PROGRESS NOTE  [name] NLZ:[id number] DOB: June 26, 1966 DOA: 05/30/2015 PCP: Gwendolyn Grant, MD  Admit HPI / Brief Narrative: 49 y.o. female with a history of Diastolic CHF, Chronic Respiratory Failure on 3-4 Liters NCO2 at home, PAH, Sarcoid, COPD, IDDM, and Gout who presented to the ED with complaints of increased SOB x 2 days, along with increased pain and swelling of her right knee and left ankle.She desaturated to 80% with ambulation in the ED, and her NCO2 was increased to 4 liters. She had recently seen her Cardiologist Dr Aundra Dubin and reported that her Torsemide was increased to 100 mg BID, and that she takes Metolazone 2.5 mg on MWF. She was scheduled to have a Rt Heart Catherization on Tuesday 06/01/2015. She was found to have evidence of Interstitial Edema on Chest X-ray, and X-rays revealed a moderate effusion on the Knee, and soft Tissue Swelling of the Ankle. Uric Acid Level was 14.2.   HPI/Subjective: The patient is resting comfortably in bed.  She states that her shortness of breath is close to her usual baseline.  She states that her knee pain in her ankle pain have improved.  She denies chest pain nausea vomiting or abdominal pain.  Assessment/Plan:  Acute on chronic diastolic CHF and R heart systolic failure  D/c weight in July was 292# - admit weight 292# - volume status difficult to assess on physical exam given body habitus - we will ask CHF team to follow-up in a.m.  Pulm HTN - OSA/OHS - Pulmonary Sarcoid Home CPAP - hospitalized for acute on chronic diastolic CHF & had RHC in 02/2015  -  Currently on macitentan and Adcirca - schedule to have [facility] on 9/6 (outpt)  Acute on chronic hypoxic respiratory failure Multifactorial as detailed above - continues to require increased O2 support   Acute gout flair - R Knee effusion + L ankle pain Uric acid 14.2 - Xray notes tricompartmental OA of the R knee w/ a large effusion  and no bony abnormality of the the L ankle - no evidence to suggest septic joint - follow w/ conservative management for now   Acute renal insufficiency  Baseline crt as of July as low as 0.95 - 1.44 at admission, complicating further attempts to diurese  Hypotension  Resolved - pt is now hypertensive - diurese and follow   Hypokalemia Improving - goal is 4.0  DM2 Poorly controlled in setting of steroid dosing in ED - adjust medical tx and follow - A1c pending    COPD No evidence of wheezing at the present time   Morbid obesity - Body mass index is 44.55 kg/(m^2).  Tobacco abuse Pt is no longer smoking - is on chantix  Code Status: FULL Family Communication: no family present at time of exam Disposition Plan: SDU   Consultants: CHF Team   Procedures: none  Antibiotics: none  DVT prophylaxis: lovenox   Objective: Blood pressure 164/76, pulse 91, temperature 97.3 F (36.3 C), temperature source Oral, resp. rate 25, height 5\' 8"  (1.727 m), weight 132.859 kg (292 lb 14.4 oz), last menstrual period 10/16/2012, SpO2 95 %.  Intake/Output Summary (Last 24 hours) at 05/31/15 0831 Last data filed at 05/31/15 0400  Gross per 24 hour  Intake      0 ml  Output   3050 ml  Net  -3050 ml   Exam: General: No acute respiratory distress Lungs: Clear to auscultation bilaterally without wheezes or crackles Cardiovascular: Regular rate  and rhythm without murmur gallop or rub normal S1 and S2 Abdomen: Nontender, nondistended, soft, bowel sounds positive, no rebound, no ascites, no appreciable mass Extremities: No significant cyanosis, clubbing, or edema bilateral lower extremities  Data Reviewed: Basic Metabolic Panel:  Recent Labs Lab 05/26/15 1211 05/30/15 1701 05/31/15 0526  NA 133* 132* 130*  K 3.3* 3.2* 3.6  CL 92* 89* 90*  CO2 30 31 26   GLUCOSE 203* 291* 407*  BUN 21* 18 20  CREATININE 1.39* 1.44* 1.38*  CALCIUM 9.5 9.3 8.7*    CBC:  Recent Labs Lab  05/30/15 1701  WBC 7.2  HGB 12.6  HCT 38.5  MCV 79.2  PLT 176    Liver Function Tests: No results for input(s): AST, ALT, ALKPHOS, BILITOT, PROT, ALBUMIN in the last 168 hours. No results for input(s): LIPASE, AMYLASE in the last 168 hours. No results for input(s): AMMONIA in the last 168 hours.  Coags: No results for input(s): INR in the last 168 hours.  Invalid input(s): PT No results for input(s): APTT in the last 168 hours.  Cardiac Enzymes:  Recent Labs Lab 05/30/15 1701 05/31/15 0017 05/31/15 0526  TROPONINI <0.03 <0.03 <0.03    CBG:  Recent Labs Lab 05/30/15 1933 05/30/15 2253 05/31/15 0037 05/31/15 0825  GLUCAP 212* 340* 391* 385*    Recent Results (from the past 240 hour(s))  MRSA PCR Screening     Status: None   Collection Time: 05/30/15 10:59 PM  Result Value Ref Range Status   MRSA by PCR NEGATIVE NEGATIVE Final    Comment:        The GeneXpert MRSA Assay (FDA approved for NASAL specimens only), is one component of a comprehensive MRSA colonization surveillance program. It is not intended to diagnose MRSA infection nor to guide or monitor treatment for MRSA infections.      Studies:   Recent x-ray studies have been reviewed in detail by the Attending Physician  Scheduled Meds:  Scheduled Meds: . ALPRAZolam  1 mg Oral TID  . aspirin EC  325 mg Oral Daily  . bumetanide  2 mg Intravenous Q12H  . buPROPion  150 mg Oral Daily  . carvedilol  3.125 mg Oral BID WC  . colchicine  0.6 mg Oral BID  . enoxaparin (LOVENOX) injection  40 mg Subcutaneous Q24H  . Influenza vac split quadrivalent PF  0.5 mL Intramuscular Tomorrow-1000  . insulin aspart  0-5 Units Subcutaneous QHS  . insulin aspart  0-9 Units Subcutaneous TID WC  . insulin glargine  72 Units Subcutaneous BID  . ipratropium-albuterol  3 mL Nebulization Q4H  . lisinopril  2.5 mg Oral Daily  . Macitentan  10 mg Oral Daily  . metolazone  5 mg Oral BID  . multivitamin with  minerals  1 tablet Oral Q Mon  . omega-3 acid ethyl esters  1 capsule Oral Daily  . pantoprazole  40 mg Oral BID  . potassium chloride  40 mEq Oral TID  . sodium chloride  3 mL Intravenous Q12H  . spironolactone  25 mg Oral BID  . Tadalafil (PAH)  1 tablet Oral Daily    Time spent on care of this patient: 35 mins   [facility] T , MD   [facility] Office  [phone] Pager - Text Page per Shea Evans as per below:  On-Call/Text Page:      [email or web address]      password [email or web address]  If 7PM-7AM, please contact night-coverage [email or web address] Password [email or web address] 05/31/2015, 8:31 AM  LOS: 1 day

## 2015-05-31 NOTE — Care Management Note (Signed)
Case Management Note  Patient Details  Name: [name] MRN: [id number] Date of Birth: 1966/06/10  Subjective/Objective:      Adm w heart failure              Action/Plan: lives at home, per chart [facility] and has home o2, pcp dr Asa Lente   Expected Discharge Date:                Expected Discharge Plan:  [place]  In-House Referral:     Discharge planning Services  CM Consult  Post Acute Care Choice:    Choice offered to:     DME Arranged:    DME Agency:     HH Arranged:  PCS/Personal Care Services [facility] Agency:  Other - See comment  Status of Service:     Medicare Important Message Given:    Date Medicare IM Given:    Medicare IM give by:    Date Additional Medicare IM Given:    Additional Medicare Important Message give by:     If discussed at [place] of Stay Meetings, dates discussed:    Additional Comments: ur review done  Lacretia Leigh, RN 05/31/2015, 12:19 PM

## 2015-06-01 ENCOUNTER — Ambulatory Visit (HOSPITAL_COMMUNITY): Admission: RE | Admit: 2015-06-01 | Payer: Medicare Other | Source: Ambulatory Visit | Admitting: Cardiology

## 2015-06-01 ENCOUNTER — Other Ambulatory Visit: Payer: Self-pay

## 2015-06-01 ENCOUNTER — Encounter (HOSPITAL_COMMUNITY)
Admission: EM | Disposition: A | Discharge status: Home Health | Payer: Medicare Other | Source: Home / Self Care | Attending: Internal Medicine

## 2015-06-01 ENCOUNTER — Encounter (HOSPITAL_COMMUNITY): Payer: Self-pay | Admitting: Cardiology

## 2015-06-01 DIAGNOSIS — I9589 Other hypotension: Secondary | ICD-10-CM | POA: Diagnosis present

## 2015-06-01 DIAGNOSIS — D860 Sarcoidosis of lung: Secondary | ICD-10-CM | POA: Diagnosis present

## 2015-06-01 DIAGNOSIS — I270 Primary pulmonary hypertension: Secondary | ICD-10-CM

## 2015-06-01 DIAGNOSIS — I5033 Acute on chronic diastolic (congestive) heart failure: Principal | ICD-10-CM

## 2015-06-01 DIAGNOSIS — I272 Pulmonary hypertension, unspecified: Secondary | ICD-10-CM | POA: Diagnosis present

## 2015-06-01 DIAGNOSIS — N179 Acute kidney failure, unspecified: Secondary | ICD-10-CM | POA: Diagnosis present

## 2015-06-01 DIAGNOSIS — J9621 Acute and chronic respiratory failure with hypoxia: Secondary | ICD-10-CM

## 2015-06-01 HISTORY — PX: CARDIAC CATHETERIZATION: SHX172

## 2015-06-01 LAB — COMPREHENSIVE METABOLIC PANEL
ALT: 42 U/L (ref 14–54)
ANION GAP: 11 (ref 5–15)
AST: 25 U/L (ref 15–41)
Albumin: 3.4 g/dL — ABNORMAL LOW (ref 3.5–5.0)
Alkaline Phosphatase: 84 U/L (ref 38–126)
BUN: 28 mg/dL — ABNORMAL HIGH (ref 6–20)
CHLORIDE: 86 mmol/L — AB (ref 101–111)
CO2: 32 mmol/L (ref 22–32)
Calcium: 9.5 mg/dL (ref 8.9–10.3)
Creatinine, Ser: 1.53 mg/dL — ABNORMAL HIGH (ref 0.44–1.00)
GFR calc non Af Amer: 39 mL/min — ABNORMAL LOW (ref >60)
GFR, EST AFRICAN AMERICAN: 45 mL/min — AB (ref >60)
Glucose, Bld: 195 mg/dL — ABNORMAL HIGH (ref 65–99)
Potassium: 3.2 mmol/L — ABNORMAL LOW (ref 3.5–5.1)
SODIUM: 129 mmol/L — AB (ref 135–145)
Total Bilirubin: 1.1 mg/dL (ref 0.3–1.2)
Total Protein: 6.7 g/dL (ref 6.5–8.1)

## 2015-06-01 LAB — CBC
HEMATOCRIT: 39.4 % (ref 36.0–46.0)
HEMOGLOBIN: 12.8 g/dL (ref 12.0–15.0)
MCH: 25.9 pg — AB (ref 26.0–34.0)
MCHC: 32.5 g/dL (ref 30.0–36.0)
MCV: 79.6 fL (ref 78.0–100.0)
Platelets: 226 10*3/uL (ref 150–400)
RBC: 4.95 MIL/uL (ref 3.87–5.11)
RDW: 18.4 % — ABNORMAL HIGH (ref 11.5–15.5)
WBC: 11.4 10*3/uL — ABNORMAL HIGH (ref 4.0–10.5)

## 2015-06-01 LAB — GLUCOSE, CAPILLARY
GLUCOSE-CAPILLARY: 200 mg/dL — AB (ref 65–99)
GLUCOSE-CAPILLARY: 174 mg/dL — AB (ref 65–99)
Glucose-Capillary: 216 mg/dL — ABNORMAL HIGH (ref 65–99)

## 2015-06-01 LAB — POCT I-STAT 3, VENOUS BLOOD GAS (G3P V)
ACID-BASE EXCESS: 8 mmol/L — AB (ref 0.0–2.0)
ACID-BASE EXCESS: 8 mmol/L — AB (ref 0.0–2.0)
Bicarbonate: 34.9 mEq/L — ABNORMAL HIGH (ref 20.0–24.0)
Bicarbonate: 35.1 mEq/L — ABNORMAL HIGH (ref 20.0–24.0)
O2 SAT: 58 %
O2 Saturation: 60 %
PCO2 VEN: 54.6 mmHg — AB (ref 45.0–50.0)
PH VEN: 7.406 — AB (ref 7.250–7.300)
PO2 VEN: 31 mmHg (ref 30.0–45.0)
TCO2: 37 mmol/L (ref 0–100)
TCO2: 37 mmol/L (ref 0–100)
pCO2, Ven: 55.8 mmHg — ABNORMAL HIGH (ref 45.0–50.0)
pH, Ven: 7.413 — ABNORMAL HIGH (ref 7.250–7.300)
pO2, Ven: 32 mmHg (ref 30.0–45.0)

## 2015-06-01 LAB — HEMOGLOBIN A1C
Hgb A1c MFr Bld: 9.9 % — ABNORMAL HIGH (ref 4.8–5.6)
Mean Plasma Glucose: 237 mg/dL

## 2015-06-01 LAB — TROPONIN I: Troponin I: <0.03 ng/mL (ref <0.031)

## 2015-06-01 SURGERY — RIGHT HEART CATH
Anesthesia: LOCAL

## 2015-06-01 MED ORDER — HEPARIN (PORCINE) IN NACL 2-0.9 UNIT/ML-% IJ SOLN
INTRAMUSCULAR | Status: AC
  Filled 2015-06-01: qty 1000

## 2015-06-01 MED ORDER — METOLAZONE 2.5 MG PO TABS: 2.5000 mg | ORAL_TABLET | Freq: Two times a day (BID) | ORAL | Status: DC

## 2015-06-01 MED ORDER — ACETAMINOPHEN 325 MG PO TABS: 650.0000 mg | ORAL_TABLET | ORAL | Status: DC | PRN

## 2015-06-01 MED ORDER — INSULIN ASPART 100 UNIT/ML ~~LOC~~ SOLN: 0.0000 [IU] | SUBCUTANEOUS | Status: DC

## 2015-06-01 MED ORDER — HYDROMORPHONE HCL 1 MG/ML IJ SOLN
1.0000 mg | Freq: Once | INTRAMUSCULAR | Status: AC
  Administered 2015-06-01: 1 mg via INTRAVENOUS

## 2015-06-01 MED ORDER — FENTANYL CITRATE (PF) 100 MCG/2ML IJ SOLN
INTRAMUSCULAR | Status: DC | PRN
  Administered 2015-06-01 (×2): 25 ug via INTRAVENOUS

## 2015-06-01 MED ORDER — SODIUM CHLORIDE 0.9 % IV SOLN: 250.0000 mL | INTRAVENOUS | Status: DC | PRN

## 2015-06-01 MED ORDER — INSULIN ASPART 100 UNIT/ML FLEXPEN: PEN_INJECTOR | SUBCUTANEOUS | Status: DC

## 2015-06-01 MED ORDER — SODIUM CHLORIDE 0.9 % IJ SOLN
3.0000 mL | Freq: Two times a day (BID) | INTRAMUSCULAR | Status: DC
  Administered 2015-06-01: 3 mL via INTRAVENOUS

## 2015-06-01 MED ORDER — POTASSIUM CHLORIDE CRYS ER 20 MEQ PO TBCR
40.0000 meq | EXTENDED_RELEASE_TABLET | Freq: Once | ORAL | Status: AC
  Administered 2015-06-01: 40 meq via ORAL
  Filled 2015-06-01: qty 2

## 2015-06-01 MED ORDER — PREDNISONE 20 MG PO TABS: 40.0000 mg | ORAL_TABLET | Freq: Every day | ORAL | Status: DC

## 2015-06-01 MED ORDER — LIDOCAINE HCL (PF) 1 % IJ SOLN
INTRAMUSCULAR | Status: AC
  Filled 2015-06-01: qty 30

## 2015-06-01 MED ORDER — INSULIN ASPART 100 UNIT/ML ~~LOC~~ SOLN
0.0000 [IU] | SUBCUTANEOUS | Status: DC
  Administered 2015-06-02: 7 [IU] via SUBCUTANEOUS

## 2015-06-01 MED ORDER — ONDANSETRON HCL 4 MG/2ML IJ SOLN: 4.0000 mg | Freq: Four times a day (QID) | INTRAMUSCULAR | Status: DC | PRN

## 2015-06-01 MED ORDER — SODIUM CHLORIDE 0.9 % IJ SOLN: 3.0000 mL | INTRAMUSCULAR | Status: DC | PRN

## 2015-06-01 MED ORDER — FENTANYL CITRATE (PF) 100 MCG/2ML IJ SOLN
INTRAMUSCULAR | Status: AC
  Filled 2015-06-01: qty 4

## 2015-06-01 MED ORDER — SODIUM CHLORIDE 0.9 % IJ SOLN: 3.0000 mL | Freq: Two times a day (BID) | INTRAMUSCULAR | Status: DC

## 2015-06-01 MED ORDER — LISINOPRIL 2.5 MG PO TABS: 2.5000 mg | ORAL_TABLET | Freq: Every day | ORAL | Status: DC

## 2015-06-01 MED ORDER — CARVEDILOL 3.125 MG PO TABS
3.1250 mg | ORAL_TABLET | Freq: Two times a day (BID) | ORAL | Status: DC
Start: 2015-06-01 — End: 2015-06-03

## 2015-06-01 MED ORDER — ALUM & MAG HYDROXIDE-SIMETH 200-200-20 MG/5ML PO SUSP
15.0000 mL | ORAL | Status: DC | PRN
  Administered 2015-06-01 – 2015-06-02 (×2): 15 mL via ORAL
  Filled 2015-06-01 (×2): qty 30

## 2015-06-01 MED ORDER — PREDNISONE 20 MG PO TABS
40.0000 mg | ORAL_TABLET | Freq: Every day | ORAL | Status: DC
  Administered 2015-06-02: 40 mg via ORAL
  Filled 2015-06-01: qty 2

## 2015-06-01 MED ORDER — MIDAZOLAM HCL 2 MG/2ML IJ SOLN
INTRAMUSCULAR | Status: AC
Start: 2015-06-01 — End: 2015-06-01
  Filled 2015-06-01: qty 4

## 2015-06-01 MED ORDER — HEPARIN SODIUM (PORCINE) 5000 UNIT/ML IJ SOLN
5000.0000 [IU] | Freq: Three times a day (TID) | INTRAMUSCULAR | Status: DC
  Administered 2015-06-01 – 2015-06-03 (×7): 5000 [IU] via SUBCUTANEOUS
  Filled 2015-06-01 (×7): qty 1

## 2015-06-01 MED ORDER — COLCHICINE 0.6 MG PO TABS: 0.6000 mg | ORAL_TABLET | Freq: Two times a day (BID) | ORAL | Status: AC

## 2015-06-01 MED ORDER — MIDAZOLAM HCL 2 MG/2ML IJ SOLN
INTRAMUSCULAR | Status: DC | PRN
  Administered 2015-06-01 (×2): 1 mg via INTRAVENOUS

## 2015-06-01 SURGICAL SUPPLY — 7 items
CATH BALLN WEDGE 5F 110CM (CATHETERS) ×2 IMPLANT
CATH SWAN GANZ 7F STRAIGHT (CATHETERS) ×4 IMPLANT
KIT HEART RIGHT NAMIC (KITS) ×2 IMPLANT
PACK CARDIAC CATHETERIZATION (CUSTOM PROCEDURE TRAY) ×2 IMPLANT
SHEATH FAST CATH BRACH 5F 5CM (SHEATH) ×2 IMPLANT
SHEATH PINNACLE 7F 10CM (SHEATH) ×2 IMPLANT
TRANSDUCER W/STOPCOCK (MISCELLANEOUS) ×2 IMPLANT

## 2015-06-01 NOTE — H&P (View-Only) (Signed)
[name], female   DOB: 01-Mar-1966, 49 y.o.   MRN: [id number] Primary Care: Asa Lente Primary Cardiologist: Aundra Dubin  HPI:  [name] is a 49 y.o. with a PMH of OHS/OSA on Cpap and home oxygen, chronic diastolic CHF with prominent RV dysfunction, pulmonary hypertension, Fibromyalgia, Pulmonary sarcoidosis, HTN, dyslipidemia, and COPD. She was hospitalized for acute on chronic diastolic CHF had RHC in 0/4888 (see below) with plans to place her on ERA (macitentan) after left heart filling pressure optimized as there is some evidence for ERA use in sarcoidosis-related PAH.   She was admitted on 04/18/15 for SOB and palpitations.  Pt was placed on IV lasix with -19 L of output during that admission.  CT of the chest showed new infiltrate along with fever and she was also treated empirically for HCAP. Her weight on discharge was 292 lbs. She is now on macitentan and recently started Adcirca 20 mg daily.  Today, she says that she does not feel good.  Weight is stable on our scale.  Feels like she is retaining fluid and not urinating enough.  She has been taking torsemide 100 bid with metolazone 3 times/week.  She has been taking ibuprofen about 4-5 times/week.  She is short of breath after walking 50-75 yards.  Chronic orthopnea.  Using CPAP.  No chest pain.  No fever, chills, cough.    6 minute walk (8/16): 180 ft/55 m  ECG: ectopic atrial rhythm at 93  Labs 7/16 K 4.1, creatinine 1.18, HCT 40.4 8/16 K 3.4, creatinine 1.37  PMH: 1. Chronic Diastolic CHF: RHC 06/10/93 with RA mean 16; PA 70/32, mean 47; PCWP mean 20; Cardiac Index (Thermo) 2.07; PVR 5.5 WU.  Echo (3/16) with EF 65-70%, D-shaped interventricular septum, moderately dilated RV with severely reduced systolic function, PASP 503 mmHg.  2. Pulmonary arterial HTN: See RHC above.  RV dysfunction as per echo above. V/Q scan in 3/16 concerning for COPD versus chronic PE, Repeat CTAs negative for PE. There is definitely a  contribution from COPD and OHS/OSA (group 3). However, there could be a contribution from sarcoidosis. 3. Pulmonary Sarcoidosis: Last CT in 7/16 showed chronic centrilobular and paraseptal emphysematous changes as well as the reticulonodular peribronchovascular opacities which are likely associated with the known sarcoidosis. 4. HTN 5. Dyslipidemia 6. COPD 7. OHS/OSA: Home oxygen and CPAP.  8. GERD 9. Obesity 10. CKD  Review of Systems: All systems reviewed and negative except as per HPI.   Current Outpatient Prescriptions  Medication Sig Dispense Refill  . albuterol (PROAIR HFA) 108 (90 BASE) MCG/ACT inhaler Inhale 2 puffs into the lungs every 6 (six) hours as needed for wheezing or shortness of breath. 8.5 g 5  . albuterol (PROVENTIL) (2.5 MG/3ML) 0.083% nebulizer solution INHALE CONTENTS OF 1 VIAL VIA NEBULIZER EVERY 6 HOURS AS NEEDED FOR WHEEZING OR SHORTNESS OF BREATH 1125 mL 0  . ALPRAZolam (XANAX) 1 MG tablet TAKE 1 TABLET BY MOUTH THREE TIMES DAILY 90 tablet 0  . aspirin EC 81 MG tablet Take 81 mg by mouth daily.    Marland Kitchen BIOTIN PO Take 1 capsule by mouth daily.    Marland Kitchen buPROPion (WELLBUTRIN XL) 150 MG 24 hr tablet Take 1 tablet (150 mg total) by mouth daily. 30 tablet 2  . cetirizine (ZYRTEC) 1 MG/ML syrup Take 10 mLs (10 mg total) by mouth at bedtime. 300 mL 0  . diclofenac sodium (VOLTAREN) 1 % GEL Apply 4 g topically 4 (four) times daily as needed (  knee pain).    Marland Kitchen esomeprazole (NEXIUM) 40 MG capsule Take 40 mg by mouth 2 (two) times daily before a meal.    . fluticasone (FLONASE) 50 MCG/ACT nasal spray Place 1 spray into both nostrils daily as needed for allergies or rhinitis.    Marland Kitchen gabapentin (NEURONTIN) 300 MG capsule Take 300 mg by mouth daily as needed (neuropathy pain).     Marland Kitchen glimepiride (AMARYL) 1 MG tablet Take 1 mg by mouth daily with breakfast.    . HYDROcodone-acetaminophen (NORCO/VICODIN) 5-325 MG per tablet Take 1 tablet by mouth 4 (four) times daily.     . hydrOXYzine  (ATARAX/VISTARIL) 10 MG tablet TAKE 1 TABLET BY MOUTH THREE TIMES DAILY AS NEEDED FOR ITCHING 30 tablet 11  . insulin aspart (NOVOLOG FLEXPEN) 100 UNIT/ML FlexPen Inject 6-10 Units into the skin 3 (three) times daily with meals.    . Insulin Glargine (LANTUS SOLOSTAR) 100 UNIT/ML Solostar Pen Inject 72 Units into the skin 2 (two) times daily. 15 pen 2  . Liraglutide (VICTOZA) 18 MG/3ML SOPN Inject 3 mLs into the skin daily.    . Macitentan 10 MG TABS Take 10 mg by mouth daily. 30 tablet 1  . metolazone (ZAROXOLYN) 2.5 MG tablet Take 1 tablet (2.5 mg total) by mouth 3 (three) times a week. Every Mon, Wed and Sat 15 tablet 3  . Multiple Vitamin (MULTIVITAMIN) capsule Take 1 capsule by mouth once a week. Mondays    . naproxen sodium (ANAPROX) 220 MG tablet Take 220 mg by mouth 2 (two) times daily as needed (joint pain).    Marland Kitchen omega-3 fish oil (MAXEPA) 1000 MG CAPS capsule Take 1 capsule by mouth daily.    Glory Rosebush DELICA LANCETS 62U MISC CHECK BLOOD SUGAR TWICE DAILY 100 each 5  . potassium chloride SA (K-DUR,KLOR-CON) 20 MEQ tablet Take 60 meq (3 tablets) in the morning and 40 meq (2 tablets) in the evening, take an extra 40 meq with Metolazone 480 tablet 3  . spironolactone (ALDACTONE) 25 MG tablet Take 25 mg by mouth 2 (two) times daily.    . Tadalafil, PAH, (ADCIRCA) 20 MG TABS Take 1 tablet by mouth daily.    Marland Kitchen tiZANidine (ZANAFLEX) 2 MG tablet Take 2 mg by mouth 2 (two) times daily as needed for muscle spasms.   3  . torsemide (DEMADEX) 100 MG tablet Take 1 tablet (100 mg total) by mouth 2 (two) times daily. 180 tablet 3  . varenicline (CHANTIX) 0.5 MG tablet Take 0.5 mg by mouth every other day.     No current facility-administered medications for this encounter.    Allergies  Allergen Reactions  . Simvastatin Other (See Comments)    Severe leg pain and burning  . Sulfamethoxazole-Trimethoprim Nausea And Vomiting    Causes Projectile Vomiting  . Zolpidem Tartrate Other (See Comments)     Chest pain  . Azithromycin Other (See Comments)    Resistant to med  . Ceftin [Cefuroxime] Swelling and Other (See Comments)    MOUTH SWELLING  . Doxycycline Other (See Comments)    Resistant to med  . Latex Itching and Rash    Also burning sensations  . Metformin And Related Diarrhea    Severe diarrhea  . Metronidazole Nausea And Vomiting  . Morphine-Naltrexone Palpitations  . Ciprofloxacin Nausea Only  . Tramadol Itching and Nausea Only    Social History   Social History  . Marital Status: Legally Separated    Spouse Name: N/A  . Number  of Children: N/A  . Years of Education: N/A   Occupational History  . Not on file.   Social History Main Topics  . Smoking status: Former Smoker -- 0.25 packs/day for 29 years    Types: Cigarettes    Quit date: 11/24/2014  . Smokeless tobacco: Never Used  . Alcohol Use: 0.0 oz/week    0 Standard drinks or equivalent per week     Comment: occasionally  . Drug Use: Yes    Special: Cocaine, Marijuana     Comment: remote cocaine; current marijuana  . Sexual Activity: Not on file   Other Topics Concern  . Not on file   Social History Narrative   Pt is single. No children. On disability   Family History  Problem Relation Age of Onset  . Hypertension Mother   . Heart disease Mother   . Emphysema Father   . Hypertension Father   . Stomach cancer Father   . Heart disease Father   . Allergies Brother   . Stomach cancer Brother   . Congestive Heart Failure Brother   . Diabetes type II Brother   . Heart disease Sister   . Diabetes type II Sister   . Heart attack Sister   . Stroke Sister   . Hypertension Sister   . Heart failure Sister     diastolic  . Hypertension Sister     Danley Danker Vitals:   05/26/15 1058  BP: 108/52  Pulse: 105  Weight: 290 lb (131.543 kg)  SpO2: 90%    PHYSICAL EXAM: General:  NAD. On 02 via [place]. Obese.  HEENT: normal Neck: supple. Thick, difficult exam but JVP does not seem significantly  elevated. Carotids 2+ bilat; no bruits. No lymphadenopathy or thryomegaly appreciated. Cor: PMI nondisplaced. Regular rate & rhythm. No rubs, gallops.  2/6 HSM LLSB. Lungs: Diminished throughout. Abdomen: Obese, soft, nontender, nondistended. No hepatosplenomegaly. No bruits or masses. Good bowel sounds. Extremities: no cyanosis, clubbing, rash. No edema.  Neuro: alert & oriented x 3, cranial nerves grossly intact. moves all 4 extremities w/o difficulty. Affect pleasant.  ASSESSMENT & PLAN: 1. 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 She has quit smoking on Chantix. She has OHS/OSA on oxygen during the day and CPAP at night. She also has biopsy-proven sarcoidosis. She has had some changes on chest CT indicative of possible sarcoidosis progression, but per last pulmonary note this does not seem to be her primary problem at this time. She has not been on steroids for sarcoidosis chronically. V/Q scan in 3/16 concerning for COPD versus chronic PE, but she has had several chest CTAs that are not indicative of acute or chronic PE.  - Continue oxygen and CPAP for OHS/OSA.  - She plans to start pulmonary rehab.  - CXR today given increased symptoms.  2. Pulmonary hypertension: Multiple possible reasons for pulmonary hypertension. PA systolic pressure estimate 106 mmHg by last echo. [place] 6/16 showed severe pulmonary arterial hypertension with some pulmonary venous hypertension => RA 16, PA 70/32 mean 46, PCWP 20, PVR 5.5. There is definitely a contribution from COPD and OHS/OSA (group 3). However, there could be a contribution from the co-existing pulmonary sarcoidosis. She does not feel like she can do a 6 minute walk today.  - She has started on macitentan as there is some evidence for ERA use in sarcoidosis-related PAH. Given data on combination therapy, she has also been started on Adcirca 20 mg daily.  3. Chronic diastolic CHF  with prominent RV  dysfunction: Severely reduced RV function on echo. R>L heart failure on cath in 6/16.Symptoms NYHA class III.  She feels like she is volume overloaded and does feel good.  Her exam is very difficult for volume but she does not appear markedly volume overloaded.  Her weight is stable.  She has recently increased her torsemide to 100 mg bid.  - Continue torsemide 100 mg po bid and continue metolazone M/W/Sat (three times a week).  She will take an extra KCl 40 on metolazone days.  I am not going to increase her diuretics at this time as I am not convinced that she is significantly volume overloaded (exam is very difficult for volume assessment), and she may feel bad because of overdiuresis/AKI.   - To aid with volume assessment, I am going to arrange for RHC early next week . We discussed risks/benefits of the procedure and she agrees to proceed.  - She needs to stop ibuprofen use.  We discussed this.  - BMET/BNP today.  4. HTN: Stable currently. 5. Dyslipidemia 6. COPD: No longer smoking.  7. CKD: Follow creatinine closely with diuresis, check today.   Loralie Champagne 05/26/2015

## 2015-06-01 NOTE — Discharge Summary (Addendum)
Physician Discharge Summary  [name]:[id number] DOB: [birth year] DOA: 05/30/2015  PCP: Gwendolyn Grant, MD  Admit date: 05/30/2015 Discharge date: 06/01/2015  Time spent: 35 minutes  Recommendations for Outpatient Follow-up:  Acute on chronic diastolic CHF and R heart systolic failure  -S/P right heart catheterization see results below -Per cardiology restartI Torsemide 100 mg bid. -Per cardiology Zaroxolyn 2.5 mg Monday and Friday -Patient is to discontinue Coreg and lisinopril -Continue Macitentan and Adcirca .  Pulm HTN - OSA/OHS - Pulmonary Sarcoid -Home CPAP  -See acute on chronic diastolic CHF  Acute on chronic hypoxic respiratory failure -Multifactorial as detailed above -Patient counseled to use CPAP as directed at night and O2 via [place] during the day 3-4 L dependent upon activity. -Patient has appointment with pulmonologist for 14 September Dr. Elsworth Soho   Acute gout flair - R Knee effusion + L ankle pain -Uric acid 14.2  - Xray notes tricompartmental OA of the R knee w/ a large effusion and no bony abnormality of the the L ankle - no evidence to suggest septic joint  -Continue colchicine 0.6 mg BID -Follow-up on 8 September with Dr. Gwendolyn Grant for further management of effusion and gout   Acute renal insufficiency (baseline Cr~0.95-1.44) -PCP to monitor closely  Hypotension  -Resolved  -Diuresis per cardiology  Hypokalemia -K-Dur 40 mEq  [facility] PCP to monitor  DM Type 2 uncontrolled -9/5 hemoglobin A1c = 9.9 -Continue Lantus 72 units BID -Continue 15 -20 units NovoLog with meals, dependent upon her fingerstick blood sugar prior to meals  -Pitocin 18 mg daily -Instructed to check her sugars before each meal and before bedtime and place figures in logbook. Patient also to record all food she consumed during the day.  COPD -No evidence of wheezing at the present time   Morbid obesity - Body mass index is 44.55 kg/(m^2).  Tobacco abuse -Pt is no  longer smoking - is on chantix   Discharge Diagnoses:  Principal Problem:   Acute on chronic diastolic CHF (congestive heart failure) Active Problems:   Morbid obesity   Diabetes type 2, uncontrolled   COPD (chronic obstructive pulmonary disease)   Tobacco abuse   Hypokalemia   Pulmonary HTN   Acute on chronic respiratory failure   Acute gout   Knee effusion   Sarcoid   Hypotension   Pulmonary hypertension   Pulmonary sarcoidosis   Acute renal failure syndrome   Other specified hypotension   Discharge Condition: Stable  Diet recommendation: American diabetic Association  Filed Weights   05/30/15 2312 05/31/15 0400 06/01/15 0434  Weight: 133.3 kg (293 lb 14 oz) 132.859 kg (292 lb 14.4 oz) 132.859 kg (292 lb 14.4 oz)    History of present illness:  49 y.o. BF PMHx Diastolic CHF, Chronic Respiratory Failure on 3-4 Liters NCO2 at home, PAH, Sarcoid, COPD, IDDM, and Gout who presented to the ED with complaints of increased SOB x 2 days, along with increased pain and swelling of her right knee and left ankle.She desaturated to 80% with ambulation in the ED, and her NCO2 was increased to 4 liters. She had recently seen her Cardiologist Dr Aundra Dubin and reported that her Torsemide was increased to 100 mg BID, and that she takes Metolazone 2.5 mg on MWF. She was scheduled to have a Rt Heart Catherization on Tuesday 06/01/2015. She was found to have evidence of Interstitial Edema on Chest X-ray, and X-rays revealed a moderate effusion on the Knee, and soft Tissue Swelling of the Ankle.  Uric Acid Level was 14.2.  During this hospitalization received right heart cardiac catheterization noncontributory toward her SOB, most likely cause is her worsening pulmonary function secondary to sarcoidosis. Patient has follow-up appointment scheduled with Dr. Elsworth Soho [facility])  Procedures: 9/6 right heart catheterization;-Mild pulmonary arterial hypertension, improved from the past.  - Mildly  elevated PCWP, RA within normal limits.   Consultations:  Dr.Dalton Claris Gladden (cardiology)  Antibiotics    Discharge Exam: Filed Vitals:   06/01/15 1758 06/01/15 2000 06/01/15 2100 06/01/15 2112  BP:  89/51  96/63  Pulse:  87 87   Temp:  97.9 F (36.6 C)    TempSrc:  Oral    Resp:  16 20   Height:      Weight:      SpO2: 97% 97% 96% 98%    General: A/O 4, somewhat groggy from the anesthetic Cardiovascular: Regular rhythm and rate, negative murmurs rubs or gallops, normal S1/S2. Heart sounds distant most likely secondary to her obesity Respiratory: Diffuse poor air movement throughout, negative wheezes, negative crackles  Discharge Instructions     Medication List    STOP taking these medications        cetirizine 1 MG/ML syrup  Commonly known as:  ZYRTEC     diclofenac sodium 1 % Gel  Commonly known as:  VOLTAREN      TAKE these medications        ADCIRCA 20 MG Tabs  Generic drug:  Tadalafil (PAH)  Take 1 tablet by mouth daily.     albuterol 108 (90 BASE) MCG/ACT inhaler  Commonly known as:  PROAIR HFA  Inhale 2 puffs into the lungs every 6 (six) hours as needed for wheezing or shortness of breath.     ALPRAZolam 1 MG tablet  Commonly known as:  XANAX  TAKE 1 TABLET BY MOUTH THREE TIMES DAILY     aspirin EC 81 MG tablet  Take 81 mg by mouth daily.     BIOTIN PO  Take 1 capsule by mouth daily.     buPROPion 150 MG 24 hr tablet  Commonly known as:  WELLBUTRIN XL  Take 1 tablet (150 mg total) by mouth daily.     carvedilol 3.125 MG tablet  Commonly known as:  COREG  Take 1 tablet (3.125 mg total) by mouth 2 (two) times daily with a meal.     colchicine 0.6 MG tablet  Take 1 tablet (0.6 mg total) by mouth 2 (two) times daily.     esomeprazole 40 MG capsule  Commonly known as:  NEXIUM  Take 40 mg by mouth 2 (two) times daily before a meal.     fluticasone 50 MCG/ACT nasal spray  Commonly known as:  FLONASE  Place 1 spray into both nostrils  daily as needed for allergies or rhinitis.     gabapentin 300 MG capsule  Commonly known as:  NEURONTIN  Take 300 mg by mouth daily as needed (neuropathy pain).     glimepiride 1 MG tablet  Commonly known as:  AMARYL  Take 1 mg by mouth daily with breakfast.     HYDROcodone-acetaminophen 5-325 MG per tablet  Commonly known as:  NORCO/VICODIN  Take 1 tablet by mouth 4 (four) times daily.     hydrOXYzine 10 MG tablet  Commonly known as:  ATARAX/VISTARIL  TAKE 1 TABLET BY MOUTH THREE TIMES DAILY AS NEEDED FOR ITCHING     insulin aspart 100 UNIT/ML FlexPen  Commonly known as:  NOVOLOG  FLEXPEN  Sliding Scale: 15 units = 250 - 300, 20 units = 301 and above     Insulin Glargine 100 UNIT/ML Solostar Pen  Commonly known as:  LANTUS SOLOSTAR  Inject 72 Units into the skin 2 (two) times daily.     lisinopril 2.5 MG tablet  Commonly known as:  PRINIVIL,ZESTRIL  Take 1 tablet (2.5 mg total) by mouth daily.     Macitentan 10 MG Tabs  Take 10 mg by mouth daily.     metolazone 2.5 MG tablet  Commonly known as:  ZAROXOLYN  Take 1 tablet (2.5 mg total) by mouth 2 (two) times daily.     multivitamin capsule  Take 1 capsule by mouth once a week. Mondays     omega-3 fish oil 1000 MG Caps capsule  Commonly known as:  MAXEPA  Take 1 capsule by mouth daily.     potassium chloride SA 20 MEQ tablet  Commonly known as:  K-DUR,KLOR-CON  Take 60 meq (3 tablets) in the morning and 40 meq (2 tablets) in the evening, take an extra 40 meq with Metolazone     predniSONE 20 MG tablet  Commonly known as:  DELTASONE  Take 2 tablets (40 mg total) by mouth daily with breakfast.     spironolactone 25 MG tablet  Commonly known as:  ALDACTONE  Take 25 mg by mouth 2 (two) times daily.     tiZANidine 2 MG tablet  Commonly known as:  ZANAFLEX  Take 2 mg by mouth 2 (two) times daily as needed for muscle spasms.     torsemide 100 MG tablet  Commonly known as:  DEMADEX  Take 1 tablet (100 mg total) by  mouth 2 (two) times daily.     varenicline 0.5 MG tablet  Commonly known as:  CHANTIX  Take 0.5 mg by mouth every other day.     VICTOZA 18 MG/3ML Sopn  Generic drug:  Liraglutide  Inject 3 mLs into the skin daily.       Allergies  Allergen Reactions  . Simvastatin Other (See Comments)    Severe leg pain and burning  . Sulfamethoxazole-Trimethoprim Nausea And Vomiting    Causes Projectile Vomiting  . Zolpidem Tartrate Other (See Comments)    Chest pain  . Azithromycin Other (See Comments)    Resistant to med  . Ceftin [Cefuroxime] Swelling and Other (See Comments)    MOUTH SWELLING  . Doxycycline Other (See Comments)    Resistant to med  . Latex Itching and Rash    Also burning sensations  . Metformin And Related Diarrhea    Severe diarrhea  . Metronidazole Nausea And Vomiting  . Morphine-Naltrexone Palpitations  . Ciprofloxacin Nausea Only  . Tramadol Itching and Nausea Only   Follow-up Information    Schedule an appointment as soon as possible for a visit with Rigoberto Noel., MD.   Specialty:  Pulmonary Disease   Why:  Keep appointment on 14 September with Dr. Novella Rob information:   [place] [place] [address] [phone]       Schedule an appointment as soon as possible for a visit with Gwendolyn Grant, MD.   Specialty:  Internal Medicine   Why:  Keep appointment on 8 September   Contact information:   [address] [place] [address] [phone]        The results of significant diagnostics from this hospitalization (including imaging, microbiology, ancillary and  laboratory) are listed below for reference.    Significant Diagnostic Studies: Dg Chest 2 View  05/30/2015   CLINICAL DATA:  Asthma, COPD.  Emphysema.  EXAM: CHEST  2 VIEW  COMPARISON:  Radiograph 05/26/2015, CT 04/19/2015  FINDINGS: Normal cardiac silhouette. There is linear interstitial markings not changed from prior. No focal consolidation.  No pneumothorax.  IMPRESSION: No change in a linear interstitial markings suggesting a combination interstitial edema and chronic interstitial lung disease.   Electronically Signed   By: Suzy Bouchard M.D.   On: 05/30/2015 15:57   Dg Chest 2 View  05/26/2015   CLINICAL DATA:  Shortness of breath, cough for 2 days  EXAM: CHEST  2 VIEW  COMPARISON:  04/21/2015  FINDINGS: There is chronic bibasilar scarring. There is no focal consolidation. There is no pleural effusion or pneumothorax. There is mild stable cardiomegaly.  The osseous structures are unremarkable.  IMPRESSION: No active cardiopulmonary disease.  Chronic bibasilar scarring.   Electronically Signed   By: Kathreen Devoid   On: 05/26/2015 13:57   Dg Ankle Complete Left  05/30/2015   CLINICAL DATA:  49 year old female with left ankle pain since yesterday evening.  EXAM: LEFT ANKLE COMPLETE - 3+ VIEW  COMPARISON:  04/19/2015  FINDINGS: Multiple views of the left ankle demonstrate no acute displaced fracture, subluxation or dislocation. Diffuse soft tissue swelling around the ankle joint.  IMPRESSION: Diffuse soft tissue swelling around the left ankle joint, without acute bony abnormality.   Electronically Signed   By: [name] M.D.   On: 05/30/2015 15:56   Dg Knee Complete 4 Views Right  05/30/2015   CLINICAL DATA:  Knee pain.  EXAM: RIGHT KNEE - COMPLETE 4+ VIEW  COMPARISON:  None.  FINDINGS: No acute fracture or dislocation. Tricompartmental osteoarthritis of the right knee. Large joint effusion. No lytic or sclerotic osseous lesion.  IMPRESSION: 1. Tricompartmental osteoarthritis of the right knee. 2. Large joint effusion.   Electronically Signed   By: Kathreen Devoid   On: 05/30/2015 17:18    Microbiology: Recent Results (from the past 240 hour(s))  MRSA PCR Screening     Status: None   Collection Time: 05/30/15 10:59 PM  Result Value Ref Range Status   MRSA by PCR NEGATIVE NEGATIVE Final    Comment:        The GeneXpert MRSA Assay  (FDA approved for NASAL specimens only), is one component of a comprehensive MRSA colonization surveillance program. It is not intended to diagnose MRSA infection nor to guide or monitor treatment for MRSA infections.      Labs: Basic Metabolic Panel:  Recent Labs Lab 05/26/15 1211 05/30/15 1701 05/31/15 0526 05/31/15 2119 06/01/15 0229  NA 133* 132* 130* 130* 129*  K 3.3* 3.2* 3.6 3.5 3.2*  CL 92* 89* 90* 84* 86*  CO2 30 31 26  33* 32  GLUCOSE 203* 291* 407* 217* 195*  BUN 21* 18 20 21* 28*  CREATININE 1.39* 1.44* 1.38* 1.28* 1.53*  CALCIUM 9.5 9.3 8.7* 9.5 9.5   Liver Function Tests:  Recent Labs Lab 06/01/15 0229  AST 25  ALT 42  ALKPHOS 84  BILITOT 1.1  PROT 6.7  ALBUMIN 3.4*   No results for input(s): LIPASE, AMYLASE in the last 168 hours. No results for input(s): AMMONIA in the last 168 hours. CBC:  Recent Labs Lab 05/30/15 1701 05/31/15 2119 06/01/15 0229  WBC 7.2 12.1* 11.4*  HGB 12.6 14.2 12.8  HCT 38.5 43.1 39.4  MCV 79.2 80.1  79.6  PLT 176 209 226   Cardiac Enzymes:  Recent Labs Lab 05/30/15 1701 05/31/15 0017 05/31/15 0526 05/31/15 1125 06/01/15 0229  TROPONINI <0.03 <0.03 <0.03 <0.03 <0.03   BNP: BNP (last 3 results)  Recent Labs  04/18/15 2135 05/26/15 1212 05/30/15 1701  BNP 15.1 12.9 24.0    ProBNP (last 3 results)  Recent Labs  07/20/14 1817  PROBNP 39.7    CBG:  Recent Labs Lab 05/31/15 1643 05/31/15 2208 06/01/15 0830 06/01/15 1648 06/01/15 2128  GLUCAP 203* 203* 200* 174* 216*       Signed:  Dia Crawford, MD [facility] [phone] pager

## 2015-06-01 NOTE — Interval H&P Note (Signed)
History and Physical Interval Note:  06/01/2015 9:50 AM  [name]  has presented today for surgery, with the diagnosis of pulmonary hypertension  The various methods of treatment have been discussed with the patient and family. After consideration of risks, benefits and other options for treatment, the patient has consented to  Procedure(s): Right Heart Cath (N/A) as a surgical intervention .  The patient's history has been reviewed, patient examined, no change in status, stable for surgery.  I have reviewed the patient's chart and labs.  Questions were answered to the patient's satisfaction.     [name] [employer]

## 2015-06-01 NOTE — Procedures (Signed)
Pt placed on CPAP.  Pt is tolerating well and is comfortable.

## 2015-06-01 NOTE — Care Management Note (Addendum)
Case Management Note  Patient Details  Name: RONNY RUDDELL MRN: [id number] Date of Birth: 01/19/1966  Subjective/Objective:        Adm w heart failure            Action/Plan: lives at home, has [facility] for per care aid. [facility] home phy [facility] w [facility]   Expected Discharge Date:                 Expected Discharge Plan:  [place]  In-House Referral:     Discharge planning Services  CM Consult  Post Acute Care Choice:  Resumption of Svcs/PTA Provider Choice offered to:     DME Arranged:    DME Agency:     HH Arranged:  PCS/Personal Care Services, PT [facility] Agency:  Other - See comment  Status of Service:     Medicare Important Message Given:    Date Medicare IM Given:    Medicare IM give by:    Date Additional Medicare IM Given:    Additional Medicare Important Message give by:     If discussed at [place] of Stay Meetings, dates discussed:    Additional Comments: have alerted mary w [facility] of pt's adm.  Lacretia Leigh, RN 06/01/2015, 11:16 AM

## 2015-06-01 NOTE — Progress Notes (Signed)
Inpatient Diabetes Program Recommendations  AACE/ADA: New Consensus Statement on Inpatient Glycemic Control (2013)  Target Ranges:  Prepandial:   less than 140 mg/dL      Peak postprandial:   less than 180 mg/dL (1-2 hours)      Critically ill patients:  140 - 180 mg/dL   Results for DAJAH, FISCHMAN (MRN [id number]) as of 06/01/2015 10:33  Ref. Range 05/31/2015 00:37 05/31/2015 08:25 05/31/2015 12:12 05/31/2015 16:43 05/31/2015 22:08 06/01/2015 08:30  Glucose-Capillary Latest Ref Range: 65-99 mg/dL 391 (H) 385 (H) 376 (H) 203 (H) 203 (H) 200 (H)    Diabetes history: DM2 Outpatient Diabetes medications: Amaryl 1 mg QAM, Lantus 72 units BID, Novolog 6-10 units TID with meals, Victoza 18 mg daily Current orders for Inpatient glycemic control: Lantus 72 units BID, Novolog 0-9 units TID with meals, Novolog 0-5 units HS  Inpatient Diabetes Program Recommendations Insulin - Basal: Note patient received one time dose of Solumedrol 125 mg on 9/4 at 18:37 which is contributing to hyperglycemia. Fasting glucose 200 mg/dl this morning. Do not recommend any changes with basal insulin at this time. Correction (SSI): Please consider increasing Novolog correction to moderate scale. Insulin - Meal Coverage: Please consider ordering Novolog 5 units TID with meals for meal coverage when diet is resumed.  Note: Patient is NPO for heart cath scheduled for today.  Thanks, Barnie Alderman, RN, MSN, CCRN, CDE Diabetes Coordinator Inpatient Diabetes Program [phone] (Team Pager from [place] to [place]) [phone] ([place] office) [phone] [facility] office) [phone] [facility] office)

## 2015-06-01 NOTE — Consult Note (Signed)
Advanced Heart Failure Team Consult Note  Referring Physician: Sherral Hammers Primary Physician: Asa Lente Primary Cardiologist:  Aundra Dubin  Reason for Consultation: Diastolic HF/ Pulmonary HTN  HPI:     [name] is a 49 y.o. with a PMH of OHS/OSA on Cpap and home oxygen, chronic diastolic CHF with prominent RV dysfunction, pulmonary hypertension, Fibromyalgia, Pulmonary sarcoidosis, HTN, dyslipidemia, and COPD. She was hospitalized for acute on chronic diastolic CHF had RHC in 01/5731 with plans to place her on ERA (macitentan) after left heart filling pressure optimized as there is some evidence for ERA use in sarcoidosis-related PAH.   She was admitted on 04/18/15 for SOB and palpitations. Pt was placed on IV lasix with -19 L of output during that admission. CT of the chest showed new infiltrate along with fever and she was also treated empirically for HCAP. Her weight on discharge was 292 lbs. She is now on macitentan and recently started Adcirca 20 mg daily.  Seen in HF clinic 05/26/15.  Weight was stable at 290, but she felt like she was retaining fluid with decreased urine production. Volume status was difficult to determine on physical exam 2/2 to body habitus. She did not feel like she could complete a 19m walk. She had increased her torsemide to 100 mg BID from 60 mg BID and was taking metolazone M/W/Saturday with extra 40 meq of K.  RHC was arranged for the following week to better assess volume status.  She presented to [facility] on 05/30/15 with joint pain she attributed to gout vs arthritis that she felt was making her breathing worse. She was up 6 lbs from her visit to the HF clinic as above. MSK exam was consistent with arthritis per ED note. Pertinent labs on admission include K 3.2, Cr 1.44, BNP 24, and negative troponins.  CXR showed unchanged combination of interstitial edema and chronic ILD.   Thus far she has diuresed 6.4L and is down 4 lbs from admit with one dose of 40 mg IV lasix and  then back to her torsemide 100 mg BID with metolazone.  RHC this am shows PAH improved from past, mildly elevated PCWP, and RA within normal limits.   She says she feels ok right now.  Her breathing has improved since admission, near baseline.  Right now only complaint is that her mouth is dry.  Lying flat s/p cath without difficulty.    Review of Systems: [y] = yes, [ ]  = no   General: Weight gain [y]; Weight loss [ ] ; Anorexia [ ] ; Fatigue [y]; Fever [ ] ; Chills [ ] ; Weakness [y]  Cardiac: Chest pain/pressure [ ] ; Resting SOB [y]; Exertional SOB [y]; Orthopnea [ ] ; Pedal Edema [y]; Palpitations [ ] ; Syncope [ ] ; Presyncope [ ] ; Paroxysmal nocturnal dyspnea[ ]   Pulmonary: Cough [ ] ; Wheezing[ ] ; Hemoptysis[ ] ; Sputum [ ] ; Snoring [ ]   GI: Vomiting[ ] ; Dysphagia[ ] ; Melena[ ] ; Hematochezia [ ] ; Heartburn[ ] ; Abdominal pain [ ] ; Constipation [ ] ; Diarrhea [ ] ; BRBPR [ ]   GU: Hematuria[ ] ; Dysuria [ ] ; Nocturia[ ]   Vascular: Pain in legs with walking [ ] ; Pain in feet with lying flat [ ] ; Non-healing sores [ ] ; Stroke [ ] ; TIA [ ] ; Slurred speech [ ] ;  Neuro: Headaches[ ] ; Vertigo[ ] ; Seizures[ ] ; Paresthesias[ ] ;Blurred vision [ ] ; Diplopia [ ] ; Vision changes [ ]   Ortho/Skin: Arthritis [y]; Joint pain [y]; Muscle pain [ ] ; Joint swelling [ ] ; Back Pain [ ] ; Rash [ ]   Psych: Depression[y]; Anxiety[ ]   Heme: Bleeding problems [ ] ; Clotting disorders [ ] ; Anemia [ ]   Endocrine: Diabetes [ ] ; Thyroid dysfunction[ ]   Home Medications Prior to Admission medications   Medication Sig Start Date End Date Taking? Authorizing Provider  albuterol (PROAIR HFA) 108 (90 BASE) MCG/ACT inhaler Inhale 2 puffs into the lungs every 6 (six) hours as needed for wheezing or shortness of breath. 05/01/15  Yes Rowe Clack, MD  albuterol (PROVENTIL) (2.5 MG/3ML) 0.083% nebulizer solution INHALE CONTENTS OF 1 VIAL VIA NEBULIZER EVERY 6 HOURS AS NEEDED FOR WHEEZING OR SHORTNESS OF BREATH 03/30/15  Yes Rowe Clack, MD  ALPRAZolam Duanne Moron) 1 MG tablet TAKE 1 TABLET BY MOUTH THREE TIMES DAILY 05/14/15  Yes Olga Millers, MD  aspirin EC 81 MG tablet Take 81 mg by mouth daily.   Yes Historical Provider, MD  BIOTIN PO Take 1 capsule by mouth daily.   Yes Historical Provider, MD  buPROPion (WELLBUTRIN XL) 150 MG 24 hr tablet Take 1 tablet (150 mg total) by mouth daily. 04/23/15  Yes Eugenie Filler, MD  cetirizine (ZYRTEC) 1 MG/ML syrup Take 10 mLs (10 mg total) by mouth at bedtime. 04/26/15  Yes Rowe Clack, MD  diclofenac sodium (VOLTAREN) 1 % GEL Apply 4 g topically 4 (four) times daily as needed (knee pain).   Yes Historical Provider, MD  esomeprazole (NEXIUM) 40 MG capsule Take 40 mg by mouth 2 (two) times daily before a meal.   Yes Historical Provider, MD  fluticasone (FLONASE) 50 MCG/ACT nasal spray Place 1 spray into both nostrils daily as needed for allergies or rhinitis.   Yes Historical Provider, MD  gabapentin (NEURONTIN) 300 MG capsule Take 300 mg by mouth daily as needed (neuropathy pain).    Yes Historical Provider, MD  glimepiride (AMARYL) 1 MG tablet Take 1 mg by mouth daily with breakfast.   Yes Historical Provider, MD  HYDROcodone-acetaminophen (NORCO/VICODIN) 5-325 MG per tablet Take 1 tablet by mouth 4 (four) times daily.    Yes Historical Provider, MD  hydrOXYzine (ATARAX/VISTARIL) 10 MG tablet TAKE 1 TABLET BY MOUTH THREE TIMES DAILY AS NEEDED FOR ITCHING 08/27/14  Yes Biagio Borg, MD  insulin aspart (NOVOLOG FLEXPEN) 100 UNIT/ML FlexPen Inject 6-10 Units into the skin 3 (three) times daily with meals. Sliding Scale: 8 units = 250 - 300, 10 units = 301 and above   Yes Historical Provider, MD  Insulin Glargine (LANTUS SOLOSTAR) 100 UNIT/ML Solostar Pen Inject 72 Units into the skin 2 (two) times daily. 05/18/15  Yes Rowe Clack, MD  Liraglutide (VICTOZA) 18 MG/3ML SOPN Inject 3 mLs into the skin daily.   Yes Historical Provider, MD  Macitentan 10 MG TABS Take 10 mg by  mouth daily. 04/11/15  Yes Barton Dubois, MD  metolazone (ZAROXOLYN) 2.5 MG tablet Take 1 tablet (2.5 mg total) by mouth 3 (three) times a week. Every Jory Sims and Sat 05/11/15  Yes Larey Dresser, MD  Multiple Vitamin (MULTIVITAMIN) capsule Take 1 capsule by mouth once a week. Mondays   Yes Historical Provider, MD  omega-3 fish oil (MAXEPA) 1000 MG CAPS capsule Take 1 capsule by mouth daily.   Yes Historical Provider, MD  potassium chloride SA (K-DUR,KLOR-CON) 20 MEQ tablet Take 60 meq (3 tablets) in the morning and 40 meq (2 tablets) in the evening, take an extra 40 meq with Metolazone 05/11/15  Yes Larey Dresser, MD  spironolactone (ALDACTONE) 25 MG tablet Take 25  mg by mouth 2 (two) times daily.   Yes Historical Provider, MD  Tadalafil, PAH, (ADCIRCA) 20 MG TABS Take 1 tablet by mouth daily.   Yes Historical Provider, MD  tiZANidine (ZANAFLEX) 2 MG tablet Take 2 mg by mouth 2 (two) times daily as needed for muscle spasms.  03/10/15  Yes Historical Provider, MD  torsemide (DEMADEX) 100 MG tablet Take 1 tablet (100 mg total) by mouth 2 (two) times daily. 05/17/15  Yes Rowe Clack, MD  varenicline (CHANTIX) 0.5 MG tablet Take 0.5 mg by mouth every other day.   Yes Historical Provider, MD    Past Medical History: Past Medical History  Diagnosis Date  . Hypertension   . Hyperlipidemia   . Chronic headache   . Fibromyalgia     daily narcotics  . Anxiety     hx chronic BZ use, stopped 07/2010  . Pulmonary sarcoidosis     unimpressive CT chest 2011  . Colonic polyp   . GERD (gastroesophageal reflux disease)   . ALLERGIC RHINITIS   . Asthma   . CHF (congestive heart failure)     Diastolic with fluid overload, May, 2012, LVEF 60%  . Morbid obesity   . Depression   . Panic attacks   . Diabetes mellitus   . COPD (chronic obstructive pulmonary disease)     on home O2, moderate airflow obstruction, suspect d/t emphysema  . Obesity   . Elevated LFTs 09/2011  . Ovarian cyst   . Chronic  back pain   . Sleep apnea      CPAP  . Fatty liver   . Esophagitis   . Gastritis   . Internal hemorrhoids   . Adenomatous colon polyp 12/03/09    [facility] in [place], [place]  . Hypoxia 03/2015  . Shortness of breath dyspnea   . Pneumonia     Past Surgical History: Past Surgical History  Procedure Laterality Date  . Polypectomy  2011  . Lumbar microdiscectomy  07/06/2011    R L4-5, stern  . Back surgery    . Carpal tunnel release    . Steroid spinal injections    . Hysteroscopy w/d&c N/A 03/25/2013    Procedure: DILATATION AND CURETTAGE /HYSTEROSCOPY;  Surgeon: [name], MD;  Location: [facility];  Service: Gynecology;  Laterality: N/A;  . Dilation and curettage of uterus    . Cardiac catheterization N/A 03/17/2015    Procedure: Right Heart Cath;  Surgeon: Larey Dresser, MD;  Location: [facility];  Service: Cardiovascular;  Laterality: N/A;    Family History: Family History  Problem Relation Age of Onset  . Hypertension Mother   . Heart disease Mother   . Emphysema Father   . Hypertension Father   . Stomach cancer Father   . Heart disease Father   . Allergies Brother   . Stomach cancer Brother   . Congestive Heart Failure Brother   . Diabetes type II Brother   . Heart disease Sister   . Diabetes type II Sister   . Heart attack Sister   . Stroke Sister   . Hypertension Sister   . Heart failure Sister     diastolic  . Hypertension Sister     Social History: Social History   Social History  . Marital Status: Legally Separated    Spouse Name: N/A  . Number of Children: N/A  . Years of Education: N/A   Social History Main Topics  . Smoking status: Former Smoker --  0.25 packs/day for 29 years    Types: Cigarettes    Quit date: 11/24/2014  . Smokeless tobacco: Never Used  . Alcohol Use: No     Comment: occasionally  . Drug Use: Yes    Special: Cocaine, Marijuana     Comment: remote cocaine; current marijuana  . Sexual Activity: Not Asked    Other Topics Concern  . None   Social History Narrative   Pt is single. No children. On disability    Allergies:  Allergies  Allergen Reactions  . Simvastatin Other (See Comments)    Severe leg pain and burning  . Sulfamethoxazole-Trimethoprim Nausea And Vomiting    Causes Projectile Vomiting  . Zolpidem Tartrate Other (See Comments)    Chest pain  . Azithromycin Other (See Comments)    Resistant to med  . Ceftin [Cefuroxime] Swelling and Other (See Comments)    MOUTH SWELLING  . Doxycycline Other (See Comments)    Resistant to med  . Latex Itching and Rash    Also burning sensations  . Metformin And Related Diarrhea    Severe diarrhea  . Metronidazole Nausea And Vomiting  . Morphine-Naltrexone Palpitations  . Ciprofloxacin Nausea Only  . Tramadol Itching and Nausea Only    Objective:    Vital Signs:   Temp:  [97.4 F (36.3 C)-98 F (36.7 C)] 97.8 F (36.6 C) (09/06 0800) Pulse Rate:  [85-96] 93 (09/06 0800) Resp:  [17-32] 18 (09/06 0800) BP: (101-145)/(58-78) 101/61 mmHg (09/06 0800) SpO2:  [96 %-99 %] 96 % (09/06 0800) Weight:  [292 lb 14.4 oz (132.859 kg)] 292 lb 14.4 oz (132.859 kg) (09/06 0434) Last BM Date: 05/28/15  Weight change: Filed Weights   05/30/15 2312 05/31/15 0400 06/01/15 0434  Weight: 293 lb 14 oz (133.3 kg) 292 lb 14.4 oz (132.859 kg) 292 lb 14.4 oz (132.859 kg)    Intake/Output:   Intake/Output Summary (Last 24 hours) at 06/01/15 1041 Last data filed at 06/01/15 0800  Gross per 24 hour  Intake   1040 ml  Output   4650 ml  Net  -3610 ml     Physical Exam: General: NAD. Obese.  HEENT: normal Neck: supple. Thick, difficult to assess but JVP does not seem elevated. Carotids 2+ bilat; no bruits. No lymphadenopathy or thryomegaly noted. Cor: PMI nondisplaced. RRR. No rubs, gallops. 2/6 HSM LLSB. Lungs: Diminished throughout. Abdomen: Obese, soft, nontender, nondistended. No hepatosplenomegaly. No bruits or masses.  +BS Extremities: no cyanosis, clubbing, rash. No edema appreciated Neuro: alert & oriented x 3, cranial nerves grossly intact. moves all 4 extremities w/o difficulty. Affect pleasant.  Telemetry: NSR 90s  Labs: Basic Metabolic Panel:  Recent Labs Lab 05/26/15 1211 05/30/15 1701 05/31/15 0526 05/31/15 2119 06/01/15 0229  NA 133* 132* 130* 130* 129*  K 3.3* 3.2* 3.6 3.5 3.2*  CL 92* 89* 90* 84* 86*  CO2 30 31 26  33* 32  GLUCOSE 203* 291* 407* 217* 195*  BUN 21* 18 20 21* 28*  CREATININE 1.39* 1.44* 1.38* 1.28* 1.53*  CALCIUM 9.5 9.3 8.7* 9.5 9.5    Liver Function Tests:  Recent Labs Lab 06/01/15 0229  AST 25  ALT 42  ALKPHOS 84  BILITOT 1.1  PROT 6.7  ALBUMIN 3.4*   No results for input(s): LIPASE, AMYLASE in the last 168 hours. No results for input(s): AMMONIA in the last 168 hours.  CBC:  Recent Labs Lab 05/30/15 1701 05/31/15 2119 06/01/15 0229  WBC 7.2 12.1* 11.4*  HGB 12.6 14.2  12.8  HCT 38.5 43.1 39.4  MCV 79.2 80.1 79.6  PLT 176 209 226    Cardiac Enzymes:  Recent Labs Lab 05/30/15 1701 05/31/15 0017 05/31/15 0526 05/31/15 1125 06/01/15 0229  TROPONINI <0.03 <0.03 <0.03 <0.03 <0.03    BNP: BNP (last 3 results)  Recent Labs  04/18/15 2135 05/26/15 1212 05/30/15 1701  BNP 15.1 12.9 24.0    ProBNP (last 3 results)  Recent Labs  07/20/14 1817  PROBNP 39.7     CBG:  Recent Labs Lab 05/31/15 0825 05/31/15 1212 05/31/15 1643 05/31/15 2208 06/01/15 0830  GLUCAP 385* 376* 203* 203* 200*    Coagulation Studies:  Recent Labs  05/31/15 2119  LABPROT 12.9  INR 0.95    Other results: EKG: 06/01/15 NSR 80s  Imaging: Dg Chest 2 View  05/30/2015   CLINICAL DATA:  Asthma, COPD.  Emphysema.  EXAM: CHEST  2 VIEW  COMPARISON:  Radiograph 05/26/2015, CT 04/19/2015  FINDINGS: Normal cardiac silhouette. There is linear interstitial markings not changed from prior. No focal consolidation. No pneumothorax.  IMPRESSION: No change  in a linear interstitial markings suggesting a combination interstitial edema and chronic interstitial lung disease.   Electronically Signed   By: Suzy Bouchard M.D.   On: 05/30/2015 15:57   Dg Ankle Complete Left  05/30/2015   CLINICAL DATA:  49 year old female with left ankle pain since yesterday evening.  EXAM: LEFT ANKLE COMPLETE - 3+ VIEW  COMPARISON:  04/19/2015  FINDINGS: Multiple views of the left ankle demonstrate no acute displaced fracture, subluxation or dislocation. Diffuse soft tissue swelling around the ankle joint.  IMPRESSION: Diffuse soft tissue swelling around the left ankle joint, without acute bony abnormality.   Electronically Signed   By: [name] M.D.   On: 05/30/2015 15:56   Dg Knee Complete 4 Views Right  05/30/2015   CLINICAL DATA:  Knee pain.  EXAM: RIGHT KNEE - COMPLETE 4+ VIEW  COMPARISON:  None.  FINDINGS: No acute fracture or dislocation. Tricompartmental osteoarthritis of the right knee. Large joint effusion. No lytic or sclerotic osseous lesion.  IMPRESSION: 1. Tricompartmental osteoarthritis of the right knee. 2. Large joint effusion.   Electronically Signed   By: Kathreen Devoid   On: 05/30/2015 17:18      Medications:     Current Medications: . ALPRAZolam  1 mg Oral TID  . aspirin EC  325 mg Oral Daily  . buPROPion  150 mg Oral Daily  . carvedilol  3.125 mg Oral BID WC  . colchicine  0.6 mg Oral BID  . heparin  5,000 Units Subcutaneous 3 times per day  . insulin aspart  0-5 Units Subcutaneous QHS  . insulin aspart  0-9 Units Subcutaneous TID WC  . insulin glargine  72 Units Subcutaneous BID  . ipratropium-albuterol  3 mL Nebulization QID  . lisinopril  2.5 mg Oral Daily  . Macitentan  10 mg Oral Daily  . metolazone  2.5 mg Oral BID  . multivitamin with minerals  1 tablet Oral Q Mon  . omega-3 acid ethyl esters  1 capsule Oral Daily  . pantoprazole  40 mg Oral BID  . potassium chloride  40 mEq Oral TID  . sodium chloride  3 mL Intravenous Q12H   . sodium chloride  3 mL Intravenous Q12H  . sodium chloride  3 mL Intravenous Q12H  . sodium chloride  3 mL Intravenous Q12H  . spironolactone  25 mg Oral BID  . Tadalafil (PAH)  1  tablet Oral Daily  . torsemide  100 mg Oral BID     Infusions: . sodium chloride 10 mL/hr at 06/01/15 0800      Assessment/Plan   [name] is a 49 y.o. who had RHC scheduled this week, and was admitted for worsening dyspnea on 05/30/15.  [place] 06/01/15 showed slightly improved PAH, mildly elevated PCWP, and RA within normal limits.  1. 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 She has quit smoking on Chantix. She has OHS/OSA on oxygen during the day and CPAP at night.  - She has biopsy-proven sarcoidosis. She has not been on steroids. - Chest CT ? indicative of possible sarcoidosis progression, but per last pulmonary note states this does not seem to be her primary problem.  - V/Q scan in 3/16 concerning for COPD versus chronic PE, but she has had several chest CTAs that are not indicative of acute or chronic PE.  - Continue oxygen and CPAP for OHS/OSA.  2. Pulmonary hypertension: Multiple possible reasons for pulmonary hypertension.  - PA systolic 47 on RHC 12/28/99. Improved from 70 mmHg by RHC 6/16 and 106 mmHg by Echo 3/16.  - Likely combination of COPD and OHS/OSA (group 3), as well as co-existing pulmonary sarcoidosis.  - She has started on macitentan as there is some evidence for ERA use in sarcoidosis-related PAH. Given data on combination therapy, she has also been started on Adcirca 20 mg daily.  3. Chronic diastolic CHF EF 02-72%, severely reduced RV function Echo 3/16, NYHA class III with prominent RV dysfunction:  - R>L heart failure on cath in 6/16.  Repeat RHC today showed lower PA pressure and only mildly elevated PCWP.  - Weight stable at previous discharge weight of 292.  - OK to go back on home torsemide 100 mg BID.  4. HTN - Stable  currently. 5. Dyslipidemia - Statin intolerance (severe leg pain and burning) 6. COPD:  - Former smoker, quit on chantix. 7. AKI on CKD Stage II -  Follow closely with diuresis  Length of Stay: 2  Shirley Friar PA-C 06/01/2015, 10:41 AM  Advanced Heart Failure Team Pager [phone] (M-F; 7a - 4p)  Please contact [facility] for night-coverage after hours (4p -7a ) and weekends on [email or web address]  Patient seen with PA, agree with the above note.  It appears that Mrs Beauchaine came to the ER mainly because of joint pain/arthritis pain.  She had IV diuresis yesterday.  Today, filling pressures are not particularly elevated and PA pressure is lower than in the past.  I think that she can go back to torsemide 100 mg bid.  Continue macitentan and Adcirca, these + diuresis seem to have helped PA pressure .  Loralie Champagne 06/01/2015 4:13 PM

## 2015-06-02 ENCOUNTER — Other Ambulatory Visit: Payer: Self-pay

## 2015-06-02 DIAGNOSIS — N179 Acute kidney failure, unspecified: Secondary | ICD-10-CM

## 2015-06-02 LAB — MAGNESIUM: Magnesium: 1.8 mg/dL (ref 1.7–2.4)

## 2015-06-02 LAB — GLUCOSE, CAPILLARY
GLUCOSE-CAPILLARY: 266 mg/dL — AB (ref 65–99)
GLUCOSE-CAPILLARY: 298 mg/dL — AB (ref 65–99)
Glucose-Capillary: 163 mg/dL — ABNORMAL HIGH (ref 65–99)
Glucose-Capillary: 291 mg/dL — ABNORMAL HIGH (ref 65–99)
Glucose-Capillary: 191 mg/dL — ABNORMAL HIGH (ref 65–99)

## 2015-06-02 LAB — CBC
HEMATOCRIT: 38.8 % (ref 36.0–46.0)
Hemoglobin: 12.7 g/dL (ref 12.0–15.0)
MCH: 26 pg (ref 26.0–34.0)
MCHC: 32.7 g/dL (ref 30.0–36.0)
MCV: 79.5 fL (ref 78.0–100.0)
PLATELETS: 228 10*3/uL (ref 150–400)
RBC: 4.88 MIL/uL (ref 3.87–5.11)
RDW: 18.5 % — AB (ref 11.5–15.5)
WBC: 8.2 10*3/uL (ref 4.0–10.5)

## 2015-06-02 LAB — BASIC METABOLIC PANEL
Anion gap: 12 (ref 5–15)
BUN: 46 mg/dL — AB (ref 6–20)
CHLORIDE: 86 mmol/L — AB (ref 101–111)
CO2: 28 mmol/L (ref 22–32)
CREATININE: 2.52 mg/dL — AB (ref 0.44–1.00)
Calcium: 8.8 mg/dL — ABNORMAL LOW (ref 8.9–10.3)
GFR calc Af Amer: 25 mL/min — ABNORMAL LOW (ref >60)
GFR calc non Af Amer: 21 mL/min — ABNORMAL LOW (ref >60)
GLUCOSE: 220 mg/dL — AB (ref 65–99)
POTASSIUM: 3.9 mmol/L (ref 3.5–5.1)
SODIUM: 126 mmol/L — AB (ref 135–145)

## 2015-06-02 LAB — TROPONIN I

## 2015-06-02 MED ORDER — GUAIFENESIN-DM 100-10 MG/5ML PO SYRP
5.0000 mL | ORAL_SOLUTION | ORAL | Status: DC | PRN
  Administered 2015-06-02: 5 mL via ORAL
  Filled 2015-06-02: qty 5

## 2015-06-02 MED ORDER — INSULIN ASPART 100 UNIT/ML ~~LOC~~ SOLN
0.0000 [IU] | Freq: Three times a day (TID) | SUBCUTANEOUS | Status: DC
  Administered 2015-06-02 (×2): 11 [IU] via SUBCUTANEOUS
  Administered 2015-06-03: 4 [IU] via SUBCUTANEOUS
  Administered 2015-06-03: 7 [IU] via SUBCUTANEOUS

## 2015-06-02 MED ORDER — INSULIN GLARGINE 100 UNIT/ML ~~LOC~~ SOLN
76.0000 [IU] | Freq: Two times a day (BID) | SUBCUTANEOUS | Status: DC
  Administered 2015-06-02 – 2015-06-03 (×2): 76 [IU] via SUBCUTANEOUS
  Filled 2015-06-02 (×3): qty 0.76

## 2015-06-02 MED ORDER — SODIUM CHLORIDE 0.9 % IV SOLN
INTRAVENOUS | Status: AC
  Administered 2015-06-02: 1000 mL via INTRAVENOUS

## 2015-06-02 MED FILL — Heparin Sodium (Porcine) 2 Unit/ML in Sodium Chloride 0.9%: INTRAMUSCULAR | Qty: 1000 | Status: AC

## 2015-06-02 MED FILL — Lidocaine HCl Local Preservative Free (PF) Inj 1%: INTRAMUSCULAR | Qty: 30 | Status: AC

## 2015-06-02 NOTE — Care Management Important Message (Signed)
Important Message  Patient Details  Name: [name] MRN: [id number] Date of Birth: Nov 16, 1965   Medicare Important Message Given:  Yes-second notification given    Delorse Lek 06/02/2015, 1:04 PM

## 2015-06-02 NOTE — Progress Notes (Signed)
Pt c/o being cold, adjusted heat in room.  Pt did not want to get oob for wgt this am.  Will continue to monitor [name] T

## 2015-06-02 NOTE — Progress Notes (Signed)
Patient c/o epigastric burning sensation that she states feels like her acid reflux.. Mylanta given per PRN orders. Pt states she wants something else for the burning sensation dilaudid given per prn orders.

## 2015-06-02 NOTE — Progress Notes (Addendum)
[name], female   DOB: 1966-03-13, 49 y.o.   MRN: [id number]   SUBJECTIVE: Today, feels cold.  She was transitioned off IV diuretics yesterday back to her home torsemide dose.  Unfortunately, creatinine up to 2.5 today.  BP is soft.   Right Heart Cath (9/6) Procedural Findings: Hemodynamics (mmHg) RA mean 5 RV 48/9 PA 47/27, mean 36 PCWP mean 17 Cardiac Output (Fick) 5.24  Cardiac Index (Fick) 2.18 PVR 3.6 WU Cardiac Output (Thermodilution) 6.38 Cardiac Index (Fick) 2.66  PVR 3.0 WU  Scheduled Meds: . ALPRAZolam  1 mg Oral TID  . aspirin EC  325 mg Oral Daily  . buPROPion  150 mg Oral Daily  . colchicine  0.6 mg Oral BID  . heparin  5,000 Units Subcutaneous 3 times per day  . insulin aspart  0-20 Units Subcutaneous 6 times per day  . insulin glargine  72 Units Subcutaneous BID  . ipratropium-albuterol  3 mL Nebulization QID  . Macitentan  10 mg Oral Daily  . multivitamin with minerals  1 tablet Oral Q Mon  . omega-3 acid ethyl esters  1 capsule Oral Daily  . pantoprazole  40 mg Oral BID  . predniSONE  40 mg Oral Q breakfast  . Tadalafil (PAH)  1 tablet Oral Daily   Continuous Infusions: . sodium chloride     PRN Meds:.acetaminophen, alum & mag hydroxide-simeth, gabapentin, HYDROmorphone (DILAUDID) injection, hydrOXYzine, loperamide, ondansetron (ZOFRAN) IV, tiZANidine   Filed Vitals:   06/01/15 2112 06/01/15 2345 06/02/15 0100 06/02/15 0307  BP: 96/63 89/38 102/49 86/44  Pulse:  84 86 83  Temp:  97.9 F (36.6 C)  97.9 F (36.6 C)  TempSrc:  Oral  Axillary  Resp:  18 17 18   Height:      Weight:      SpO2: 98% 95% 97% 93%    Intake/Output Summary (Last 24 hours) at 06/02/15 0731 Last data filed at 06/02/15 0200  Gross per 24 hour  Intake    320 ml  Output   2000 ml  Net  -1680 ml    LABS: Basic Metabolic Panel:  Recent Labs  06/01/15 0229 06/02/15 0233  NA 129* 126*  K 3.2* 3.9  CL 86* 86*  CO2 32 28  GLUCOSE 195* 220*  BUN  28* 46*  CREATININE 1.53* 2.52*  CALCIUM 9.5 8.8*  MG  --  1.8   Liver Function Tests:  Recent Labs  06/01/15 0229  AST 25  ALT 42  ALKPHOS 84  BILITOT 1.1  PROT 6.7  ALBUMIN 3.4*   No results for input(s): LIPASE, AMYLASE in the last 72 hours. CBC:  Recent Labs  06/01/15 0229 06/02/15 0233  WBC 11.4* 8.2  HGB 12.8 12.7  HCT 39.4 38.8  MCV 79.6 79.5  PLT 226 228   Cardiac Enzymes:  Recent Labs  05/31/15 0526 05/31/15 1125 06/01/15 0229  TROPONINI <0.03 <0.03 <0.03   BNP: Invalid input(s): POCBNP D-Dimer:  Recent Labs  05/30/15 2057  DDIMER 0.45   Hemoglobin A1C:  Recent Labs  05/31/15 0526  HGBA1C 9.9*   Fasting Lipid Panel: No results for input(s): CHOL, HDL, LDLCALC, TRIG, CHOLHDL, LDLDIRECT in the last 72 hours. Thyroid Function Tests: No results for input(s): TSH, T4TOTAL, T3FREE, THYROIDAB in the last 72 hours.  Invalid input(s): FREET3 Anemia Panel: No results for input(s): VITAMINB12, FOLATE, FERRITIN, TIBC, IRON, RETICCTPCT in the last 72 hours.  RADIOLOGY: Dg Chest 2 View  05/30/2015   CLINICAL  DATA:  Asthma, COPD.  Emphysema.  EXAM: CHEST  2 VIEW  COMPARISON:  Radiograph 05/26/2015, CT 04/19/2015  FINDINGS: Normal cardiac silhouette. There is linear interstitial markings not changed from prior. No focal consolidation. No pneumothorax.  IMPRESSION: No change in a linear interstitial markings suggesting a combination interstitial edema and chronic interstitial lung disease.   Electronically Signed   By: Suzy Bouchard M.D.   On: 05/30/2015 15:57   Dg Chest 2 View  05/26/2015   CLINICAL DATA:  Shortness of breath, cough for 2 days  EXAM: CHEST  2 VIEW  COMPARISON:  04/21/2015  FINDINGS: There is chronic bibasilar scarring. There is no focal consolidation. There is no pleural effusion or pneumothorax. There is mild stable cardiomegaly.  The osseous structures are unremarkable.  IMPRESSION: No active cardiopulmonary disease.  Chronic  bibasilar scarring.   Electronically Signed   By: Kathreen Devoid   On: 05/26/2015 13:57   Dg Ankle Complete Left  05/30/2015   CLINICAL DATA:  49 year old female with left ankle pain since yesterday evening.  EXAM: LEFT ANKLE COMPLETE - 3+ VIEW  COMPARISON:  04/19/2015  FINDINGS: Multiple views of the left ankle demonstrate no acute displaced fracture, subluxation or dislocation. Diffuse soft tissue swelling around the ankle joint.  IMPRESSION: Diffuse soft tissue swelling around the left ankle joint, without acute bony abnormality.   Electronically Signed   By: [name] M.D.   On: 05/30/2015 15:56   Dg Knee Complete 4 Views Right  05/30/2015   CLINICAL DATA:  Knee pain.  EXAM: RIGHT KNEE - COMPLETE 4+ VIEW  COMPARISON:  None.  FINDINGS: No acute fracture or dislocation. Tricompartmental osteoarthritis of the right knee. Large joint effusion. No lytic or sclerotic osseous lesion.  IMPRESSION: 1. Tricompartmental osteoarthritis of the right knee. 2. Large joint effusion.   Electronically Signed   By: Kathreen Devoid   On: 05/30/2015 17:18    PHYSICAL EXAM General: NAD, obese Neck: Thick, JVP difficult, no thyromegaly or thyroid nodule.  Lungs: Clear to auscultation bilaterally with normal respiratory effort. CV: Nondisplaced PMI.  Heart regular S1/S2, no S3/S4, no murmur. Trace ankle edema.    Abdomen: Soft, nontender, no hepatosplenomegaly, no distention.  Neurologic: Alert and oriented x 3.  Psych: Normal affect. Extremities: No clubbing or cyanosis.  Right groin cath site benign.   TELEMETRY: Reviewed telemetry pt in NSR  ASSESSMENT AND PLAN: 49 yo with history of pulmonary HTN, diastolic CHF with prominent RV dysfunction, OHS/OSA, COPD was admitted primarily with joint pain but also reported dyspnea.  1. Acute on chronic diastolic CHF with prominent RV dysfunction: She says that she came to the ER primarily because of joint pain, but reported dyspnea.  Volume is difficult to discern by  exam.  She got IV lasix and IV bumetanide and diuresed the first couple of days in hospital.  Yesterday, I did [place] showing that filling pressures were fairly well optimized and switched her back to her home po diuretics.  However, creatinine is up to 2.5 today.  I suspect that we overshot her diuresis, she probably was not that volume overloaded to begin with.  - Stop torsemide, spironolactone, and metolazone.  - Will give very gentle IV fluid NS 75 cc/hr x 6 hrs.  2. AKI: She has had AKI in the past with overdiuresis.  I think this is what happened, and she was also started on lisinopril this admission it appears.  - Stop lisinopril, stop diuretics as above.  - Gentle  IV fluid as above.  - I will stop Coreg for now to promote higher blood pressure.  3. OHS/OSA: CPAP at night, oxygen during the day.  4. Pulmonary artery hypertension: Improved based on yesterday's RHC.  She will continue macitentan and Adcirca.   Loralie Champagne 06/02/2015 7:40 AM

## 2015-06-02 NOTE — Progress Notes (Signed)
[facility] TEAM 1 - Stepdown/ICU TEAM PROGRESS NOTE  [name] MBE:[id number] DOB: 1965-12-14 DOA: 05/30/2015 PCP: Gwendolyn Grant, MD  Admit HPI / Brief Narrative: 49 y.o. female with a history of Diastolic CHF, Chronic Respiratory Failure on 3-4 Liters NCO2 at home, PAH, Sarcoid, COPD, IDDM, and Gout who presented to the ED with complaints of increased SOB x 2 days, along with increased pain and swelling of her right knee and left ankle.She desaturated to 80% with ambulation in the ED, and her NCO2 was increased to 4 liters. She had recently seen her Cardiologist Dr Aundra Dubin and reported that her Torsemide was increased to 100 mg BID, and that she takes Metolazone 2.5 mg on MWF. She was scheduled to have a Rt Heart Catherization on Tuesday 06/01/2015. She was found to have evidence of Interstitial Edema on Chest X-ray, and X-rays revealed a moderate effusion on the Knee, and soft Tissue Swelling of the Ankle. Uric Acid Level was 14.2.   HPI/Subjective: The pt c/o some central chest pain "like indigestion."  She denies sob.  She denies n/v, or abdom pain.  Assessment/Plan:  Acute on chronic diastolic CHF and R heart systolic failure  D/c weight in July was 292# - admit weight 292# - pt refused wgt today - volume status difficult to assess on physical exam given body habitus - care as per CHF Team who is holding further diuresis at this time   Chest discomfort Sounds most c/w reflux (pt laying flat in bed yesterday and this morning) - check EKG and cardiac markers  Pulm HTN - OSA/OHS - Pulmonary Sarcoid Home CPAP - hospitalized for acute on chronic diastolic CHF & had RHC in 02/2015  -  Currently on macitentan and Adcirca - RHC 9/6 noted improved R sided pressures   Acute on chronic hypoxic respiratory failure Multifactorial as detailed above - continues to require increased O2 support - attempt to wean to home dose of 3L today   Acute gout flair - R Knee effusion + L ankle  pain Uric acid 14.2 - Xray notes tricompartmental OA of the R knee w/ a large effusion and no bony abnormality of the the L ankle - no evidence to suggest septic joint - follow w/ conservative management for now - pt reports pain in joints is markedly improved   Acute renal insufficiency  Baseline crt as of July as low as 0.95 - 1.44 at admission, and has now climbed to 2.5 - ACE started by admitting MD stopped - holding diuresis today - follow trend w/ gentle hydration   Hypotension  Has recurred w/ diuresis - gently hydrating today as above   Hypokalemia Resolved   DM2 Remains above goal - adjust medical tx and follow - A1c 9.9    COPD No evidence of wheezing at the present time   Morbid obesity - Body mass index is 44.55 kg/(m^2).  Tobacco abuse Pt is no longer smoking - is on chantix  Code Status: FULL Family Communication: no family present at time of exam Disposition Plan: SDU   Consultants: CHF Team   Procedures: 9/6 - R heart cath   Antibiotics: none  DVT prophylaxis: lovenox   Objective: Blood pressure 99/45, pulse 83, temperature 99.2 F (37.3 C), temperature source Oral, resp. rate 18, height 5\' 8"  (1.727 m), weight 132.859 kg (292 lb 14.4 oz), last menstrual period 10/16/2012, SpO2 94 %.  Intake/Output Summary (Last 24 hours) at 06/02/15 1003 Last data filed at 06/02/15 0800  Gross  per 24 hour  Intake    240 ml  Output   2100 ml  Net  -1860 ml   Exam: General: No acute respiratory distress - alert and conversant  Lungs: Clear to auscultation bilaterally without wheezes Cardiovascular: Regular rate and rhythm without murmur gallop or rub  Abdomen: Nontender, morbidly obese, soft, bowel sounds positive, no rebound, no ascites, no appreciable mass Extremities: No significant cyanosis, clubbing, or edema bilateral lower extremities - R knee effusion diminished in size - no erythema or calor   Data Reviewed: Basic Metabolic Panel:  Recent Labs Lab  05/30/15 1701 05/31/15 0526 05/31/15 2119 06/01/15 0229 06/02/15 0233  NA 132* 130* 130* 129* 126*  K 3.2* 3.6 3.5 3.2* 3.9  CL 89* 90* 84* 86* 86*  CO2 31 26 33* 32 28  GLUCOSE 291* 407* 217* 195* 220*  BUN 18 20 21* 28* 46*  CREATININE 1.44* 1.38* 1.28* 1.53* 2.52*  CALCIUM 9.3 8.7* 9.5 9.5 8.8*  MG  --   --   --   --  1.8    CBC:  Recent Labs Lab 05/30/15 1701 05/31/15 2119 06/01/15 0229 06/02/15 0233  WBC 7.2 12.1* 11.4* 8.2  HGB 12.6 14.2 12.8 12.7  HCT 38.5 43.1 39.4 38.8  MCV 79.2 80.1 79.6 79.5  PLT 176 209 226 228    Liver Function Tests:  Recent Labs Lab 06/01/15 0229  AST 25  ALT 42  ALKPHOS 84  BILITOT 1.1  PROT 6.7  ALBUMIN 3.4*    Coags:  Recent Labs Lab 05/31/15 2119  INR 0.95   Cardiac Enzymes:  Recent Labs Lab 05/30/15 1701 05/31/15 0017 05/31/15 0526 05/31/15 1125 06/01/15 0229  TROPONINI <0.03 <0.03 <0.03 <0.03 <0.03    CBG:  Recent Labs Lab 06/01/15 0830 06/01/15 1209 06/01/15 1648 06/01/15 2128 06/02/15 0752  GLUCAP 200* 163* 174* 216* 266*    Recent Results (from the past 240 hour(s))  MRSA PCR Screening     Status: None   Collection Time: 05/30/15 10:59 PM  Result Value Ref Range Status   MRSA by PCR NEGATIVE NEGATIVE Final    Comment:        The GeneXpert MRSA Assay (FDA approved for NASAL specimens only), is one component of a comprehensive MRSA colonization surveillance program. It is not intended to diagnose MRSA infection nor to guide or monitor treatment for MRSA infections.      Studies:   Recent x-ray studies have been reviewed in detail by the Attending Physician  Scheduled Meds:  Scheduled Meds: . ALPRAZolam  1 mg Oral TID  . aspirin EC  325 mg Oral Daily  . buPROPion  150 mg Oral Daily  . colchicine  0.6 mg Oral BID  . heparin  5,000 Units Subcutaneous 3 times per day  . insulin aspart  0-20 Units Subcutaneous 6 times per day  . insulin glargine  72 Units Subcutaneous BID  .  ipratropium-albuterol  3 mL Nebulization QID  . Macitentan  10 mg Oral Daily  . multivitamin with minerals  1 tablet Oral Q Mon  . omega-3 acid ethyl esters  1 capsule Oral Daily  . pantoprazole  40 mg Oral BID  . predniSONE  40 mg Oral Q breakfast  . Tadalafil (PAH)  1 tablet Oral Daily    Time spent on care of this patient: 35 mins   Ellah Otte T , MD   [facility] Office  [phone] Pager - Text Page per Shea Evans as per below:  On-Call/Text Page:      [email or web address]      password [email or web address]  If 7PM-7AM, please contact night-coverage [email or web address] Password [email or web address] 06/02/2015, 10:03 AM   LOS: 3 days

## 2015-06-03 ENCOUNTER — Ambulatory Visit: Payer: Self-pay | Admitting: Internal Medicine

## 2015-06-03 DIAGNOSIS — E662 Morbid (severe) obesity with alveolar hypoventilation: Secondary | ICD-10-CM

## 2015-06-03 DIAGNOSIS — E1165 Type 2 diabetes mellitus with hyperglycemia: Secondary | ICD-10-CM

## 2015-06-03 DIAGNOSIS — M10061 Idiopathic gout, right knee: Secondary | ICD-10-CM

## 2015-06-03 DIAGNOSIS — E876 Hypokalemia: Secondary | ICD-10-CM

## 2015-06-03 DIAGNOSIS — I9589 Other hypotension: Secondary | ICD-10-CM

## 2015-06-03 DIAGNOSIS — G4733 Obstructive sleep apnea (adult) (pediatric): Secondary | ICD-10-CM

## 2015-06-03 DIAGNOSIS — Z720 Tobacco use: Secondary | ICD-10-CM

## 2015-06-03 DIAGNOSIS — D860 Sarcoidosis of lung: Secondary | ICD-10-CM

## 2015-06-03 LAB — GLUCOSE, CAPILLARY
Glucose-Capillary: 161 mg/dL — ABNORMAL HIGH (ref 65–99)
Glucose-Capillary: 212 mg/dL — ABNORMAL HIGH (ref 65–99)
Glucose-Capillary: 164 mg/dL — ABNORMAL HIGH (ref 65–99)

## 2015-06-03 LAB — BASIC METABOLIC PANEL WITH GFR
Anion gap: 9 (ref 5–15)
BUN: 32 mg/dL — ABNORMAL HIGH (ref 6–20)
CO2: 32 mmol/L (ref 22–32)
Calcium: 9.1 mg/dL (ref 8.9–10.3)
Chloride: 89 mmol/L — ABNORMAL LOW (ref 101–111)
Creatinine, Ser: 1.26 mg/dL — ABNORMAL HIGH (ref 0.44–1.00)
GFR calc Af Amer: 57 mL/min — ABNORMAL LOW
GFR calc non Af Amer: 50 mL/min — ABNORMAL LOW
Glucose, Bld: 171 mg/dL — ABNORMAL HIGH (ref 65–99)
Potassium: 4.2 mmol/L (ref 3.5–5.1)
Sodium: 130 mmol/L — ABNORMAL LOW (ref 135–145)

## 2015-06-03 MED ORDER — METOLAZONE 2.5 MG PO TABS: ORAL_TABLET | ORAL | Status: DC

## 2015-06-03 MED ORDER — TORSEMIDE 20 MG PO TABS
100.0000 mg | ORAL_TABLET | Freq: Two times a day (BID) | ORAL | Status: DC
  Administered 2015-06-03: 100 mg via ORAL
  Filled 2015-06-03: qty 5

## 2015-06-03 MED ORDER — CIPROFLOXACIN HCL 0.3 % OP SOLN
2.0000 [drp] | OPHTHALMIC | Status: DC
  Administered 2015-06-03: 2 [drp] via OPHTHALMIC
  Filled 2015-06-03: qty 2.5

## 2015-06-03 MED ORDER — POTASSIUM CHLORIDE CRYS ER 20 MEQ PO TBCR
60.0000 meq | EXTENDED_RELEASE_TABLET | Freq: Every day | ORAL | Status: DC
  Administered 2015-06-03: 60 meq via ORAL
  Filled 2015-06-03: qty 3

## 2015-06-03 MED ORDER — POTASSIUM CHLORIDE CRYS ER 20 MEQ PO TBCR: 40.0000 meq | EXTENDED_RELEASE_TABLET | Freq: Every evening | ORAL | Status: AC

## 2015-06-03 MED ORDER — CIPROFLOXACIN HCL 0.3 % OP SOLN: 2.0000 [drp] | OPHTHALMIC | Status: DC

## 2015-06-03 MED ORDER — POTASSIUM CHLORIDE CRYS ER 20 MEQ PO TBCR: 40.0000 meq | EXTENDED_RELEASE_TABLET | Freq: Every evening | ORAL | Status: DC

## 2015-06-03 NOTE — Progress Notes (Signed)
[name], female   DOB: 1966-01-25, 49 y.o.   MRN: [id number]   SUBJECTIVE: BP better, creatinine down to 1.26 today.  Feels fine, ready to go home.    Right Heart Cath (9/6) Procedural Findings: Hemodynamics (mmHg) RA mean 5 RV 48/9 PA 47/27, mean 36 PCWP mean 17 Cardiac Output (Fick) 5.24  Cardiac Index (Fick) 2.18 PVR 3.6 WU Cardiac Output (Thermodilution) 6.38 Cardiac Index (Fick) 2.66  PVR 3.0 WU  Scheduled Meds: . ALPRAZolam  1 mg Oral TID  . aspirin EC  325 mg Oral Daily  . buPROPion  150 mg Oral Daily  . colchicine  0.6 mg Oral BID  . heparin  5,000 Units Subcutaneous 3 times per day  . insulin aspart  0-20 Units Subcutaneous TID WC  . insulin glargine  76 Units Subcutaneous BID  . ipratropium-albuterol  3 mL Nebulization QID  . Macitentan  10 mg Oral Daily  . multivitamin with minerals  1 tablet Oral Q Mon  . omega-3 acid ethyl esters  1 capsule Oral Daily  . pantoprazole  40 mg Oral BID  . Tadalafil (PAH)  1 tablet Oral Daily  . torsemide  100 mg Oral BID   Continuous Infusions:   PRN Meds:.acetaminophen, alum & mag hydroxide-simeth, gabapentin, guaiFENesin-dextromethorphan, HYDROmorphone (DILAUDID) injection, hydrOXYzine, loperamide, ondansetron (ZOFRAN) IV, tiZANidine   Filed Vitals:   06/02/15 1957 06/02/15 2351 06/03/15 0332 06/03/15 0336  BP:  125/62  122/47  Pulse:  86  82  Temp:  97.9 F (36.6 C) 97.7 F (36.5 C)   TempSrc:  Oral Oral   Resp:  20  17  Height:      Weight:   290 lb 5.5 oz (131.7 kg)   SpO2: 97% 96%  93%    Intake/Output Summary (Last 24 hours) at 06/03/15 0730 Last data filed at 06/03/15 0600  Gross per 24 hour  Intake    450 ml  Output   5575 ml  Net  -5125 ml    LABS: Basic Metabolic Panel:  Recent Labs  06/02/15 0233 06/03/15 0230  NA 126* 130*  K 3.9 4.2  CL 86* 89*  CO2 28 32  GLUCOSE 220* 171*  BUN 46* 32*  CREATININE 2.52* 1.26*  CALCIUM 8.8* 9.1  MG 1.8  --    Liver Function  Tests:  Recent Labs  06/01/15 0229  AST 25  ALT 42  ALKPHOS 84  BILITOT 1.1  PROT 6.7  ALBUMIN 3.4*   No results for input(s): LIPASE, AMYLASE in the last 72 hours. CBC:  Recent Labs  06/01/15 0229 06/02/15 0233  WBC 11.4* 8.2  HGB 12.8 12.7  HCT 39.4 38.8  MCV 79.6 79.5  PLT 226 228   Cardiac Enzymes:  Recent Labs  06/02/15 1142 06/02/15 1705 06/02/15 2200  TROPONINI <0.03 <0.03 <0.03   BNP: Invalid input(s): POCBNP D-Dimer: No results for input(s): DDIMER in the last 72 hours. Hemoglobin A1C: No results for input(s): HGBA1C in the last 72 hours. Fasting Lipid Panel: No results for input(s): CHOL, HDL, LDLCALC, TRIG, CHOLHDL, LDLDIRECT in the last 72 hours. Thyroid Function Tests: No results for input(s): TSH, T4TOTAL, T3FREE, THYROIDAB in the last 72 hours.  Invalid input(s): FREET3 Anemia Panel: No results for input(s): VITAMINB12, FOLATE, FERRITIN, TIBC, IRON, RETICCTPCT in the last 72 hours.  RADIOLOGY: Dg Chest 2 View  05/30/2015   CLINICAL DATA:  Asthma, COPD.  Emphysema.  EXAM: CHEST  2 VIEW  COMPARISON:  Radiograph 05/26/2015,  CT 04/19/2015  FINDINGS: Normal cardiac silhouette. There is linear interstitial markings not changed from prior. No focal consolidation. No pneumothorax.  IMPRESSION: No change in a linear interstitial markings suggesting a combination interstitial edema and chronic interstitial lung disease.   Electronically Signed   By: Suzy Bouchard M.D.   On: 05/30/2015 15:57   Dg Chest 2 View  05/26/2015   CLINICAL DATA:  Shortness of breath, cough for 2 days  EXAM: CHEST  2 VIEW  COMPARISON:  04/21/2015  FINDINGS: There is chronic bibasilar scarring. There is no focal consolidation. There is no pleural effusion or pneumothorax. There is mild stable cardiomegaly.  The osseous structures are unremarkable.  IMPRESSION: No active cardiopulmonary disease.  Chronic bibasilar scarring.   Electronically Signed   By: Kathreen Devoid   On: 05/26/2015  13:57   Dg Ankle Complete Left  05/30/2015   CLINICAL DATA:  49 year old female with left ankle pain since yesterday evening.  EXAM: LEFT ANKLE COMPLETE - 3+ VIEW  COMPARISON:  04/19/2015  FINDINGS: Multiple views of the left ankle demonstrate no acute displaced fracture, subluxation or dislocation. Diffuse soft tissue swelling around the ankle joint.  IMPRESSION: Diffuse soft tissue swelling around the left ankle joint, without acute bony abnormality.   Electronically Signed   By: [name] M.D.   On: 05/30/2015 15:56   Dg Knee Complete 4 Views Right  05/30/2015   CLINICAL DATA:  Knee pain.  EXAM: RIGHT KNEE - COMPLETE 4+ VIEW  COMPARISON:  None.  FINDINGS: No acute fracture or dislocation. Tricompartmental osteoarthritis of the right knee. Large joint effusion. No lytic or sclerotic osseous lesion.  IMPRESSION: 1. Tricompartmental osteoarthritis of the right knee. 2. Large joint effusion.   Electronically Signed   By: Kathreen Devoid   On: 05/30/2015 17:18    PHYSICAL EXAM General: NAD, obese Neck: Thick, JVP difficult, no thyromegaly or thyroid nodule.  Lungs: Clear to auscultation bilaterally with normal respiratory effort. CV: Nondisplaced PMI.  Heart regular S1/S2, no S3/S4, no murmur. Trace ankle edema.    Abdomen: Soft, nontender, no hepatosplenomegaly, no distention.  Neurologic: Alert and oriented x 3.  Psych: Normal affect. Extremities: No clubbing or cyanosis.    TELEMETRY: Reviewed telemetry pt in NSR  ASSESSMENT AND PLAN: 49 yo with history of pulmonary HTN, diastolic CHF with prominent RV dysfunction, OHS/OSA, COPD was admitted primarily with joint pain but also reported dyspnea.  1. Acute on chronic diastolic CHF with prominent RV dysfunction: She says that she came to the ER primarily because of joint pain, but reported dyspnea.  Volume is difficult to discern by exam.  She got IV lasix and IV bumetanide and diuresed the first couple of days in hospital.  Tuesday, I did RHC  showing that filling pressures were fairly well optimized and switched her back to her home po diuretics.  However, creatinine rose to 2.5 in the setting of hypotension with diuresis and starting on lisinopril and Coreg.  Diuretics were held and lisinopril/Coreg stopped.  Today, BP and creatinine much better. - Resume torsemide and KCl, she will need this at home.   - She does not need lisinopril and Coreg, was not on prior to hospitalization, would leave off.   2. AKI: Resolved today, restart diuretics to avoid re-accumulation of fluid (narrow window).  3. OHS/OSA: CPAP at night, oxygen during the day.  4. Pulmonary artery hypertension: Improved based on RHC.  She will continue macitentan and Adcirca.  5. Disposition: She can go  home today, would resume torsemide 100 mg bid and would use metolazone twice a week on Mondays and Fridays. Other meds should stay the same as before admission (no Coreg or lisinopril).   Loralie Champagne 06/03/2015 7:30 AM

## 2015-06-07 DIAGNOSIS — J449 Chronic obstructive pulmonary disease, unspecified: Secondary | ICD-10-CM | POA: Diagnosis not present

## 2015-06-07 DIAGNOSIS — N183 Chronic kidney disease, stage 3 (moderate): Secondary | ICD-10-CM | POA: Diagnosis not present

## 2015-06-07 DIAGNOSIS — M6281 Muscle weakness (generalized): Secondary | ICD-10-CM | POA: Diagnosis not present

## 2015-06-07 DIAGNOSIS — M797 Fibromyalgia: Secondary | ICD-10-CM | POA: Diagnosis not present

## 2015-06-07 DIAGNOSIS — D860 Sarcoidosis of lung: Secondary | ICD-10-CM | POA: Diagnosis not present

## 2015-06-07 DIAGNOSIS — E1122 Type 2 diabetes mellitus with diabetic chronic kidney disease: Secondary | ICD-10-CM | POA: Diagnosis not present

## 2015-06-07 DIAGNOSIS — I129 Hypertensive chronic kidney disease with stage 1 through stage 4 chronic kidney disease, or unspecified chronic kidney disease: Secondary | ICD-10-CM | POA: Diagnosis not present

## 2015-06-07 DIAGNOSIS — F332 Major depressive disorder, recurrent severe without psychotic features: Secondary | ICD-10-CM | POA: Diagnosis not present

## 2015-06-07 DIAGNOSIS — F419 Anxiety disorder, unspecified: Secondary | ICD-10-CM | POA: Diagnosis not present

## 2015-06-07 DIAGNOSIS — J45909 Unspecified asthma, uncomplicated: Secondary | ICD-10-CM | POA: Diagnosis not present

## 2015-06-07 DIAGNOSIS — I5032 Chronic diastolic (congestive) heart failure: Secondary | ICD-10-CM | POA: Diagnosis not present

## 2015-06-07 DIAGNOSIS — I272 Other secondary pulmonary hypertension: Secondary | ICD-10-CM | POA: Diagnosis not present

## 2015-06-07 DIAGNOSIS — J9611 Chronic respiratory failure with hypoxia: Secondary | ICD-10-CM | POA: Diagnosis not present

## 2015-06-08 DIAGNOSIS — G4733 Obstructive sleep apnea (adult) (pediatric): Secondary | ICD-10-CM | POA: Diagnosis not present

## 2015-06-09 ENCOUNTER — Encounter: Payer: Self-pay | Admitting: Pulmonary Disease

## 2015-06-09 ENCOUNTER — Ambulatory Visit (INDEPENDENT_AMBULATORY_CARE_PROVIDER_SITE_OTHER): Payer: Medicare Other | Admitting: Pulmonary Disease

## 2015-06-09 ENCOUNTER — Other Ambulatory Visit: Payer: Self-pay | Admitting: Internal Medicine

## 2015-06-09 VITALS — BP 112/64 | HR 104 | Ht 68.0 in | Wt 285.8 lb

## 2015-06-09 DIAGNOSIS — F419 Anxiety disorder, unspecified: Secondary | ICD-10-CM | POA: Diagnosis not present

## 2015-06-09 DIAGNOSIS — D869 Sarcoidosis, unspecified: Secondary | ICD-10-CM

## 2015-06-09 DIAGNOSIS — J9611 Chronic respiratory failure with hypoxia: Secondary | ICD-10-CM | POA: Diagnosis not present

## 2015-06-09 DIAGNOSIS — E1122 Type 2 diabetes mellitus with diabetic chronic kidney disease: Secondary | ICD-10-CM | POA: Diagnosis not present

## 2015-06-09 DIAGNOSIS — I5032 Chronic diastolic (congestive) heart failure: Secondary | ICD-10-CM | POA: Diagnosis not present

## 2015-06-09 DIAGNOSIS — I272 Other secondary pulmonary hypertension: Secondary | ICD-10-CM | POA: Diagnosis not present

## 2015-06-09 DIAGNOSIS — M797 Fibromyalgia: Secondary | ICD-10-CM | POA: Diagnosis not present

## 2015-06-09 DIAGNOSIS — J449 Chronic obstructive pulmonary disease, unspecified: Secondary | ICD-10-CM | POA: Diagnosis not present

## 2015-06-09 DIAGNOSIS — I129 Hypertensive chronic kidney disease with stage 1 through stage 4 chronic kidney disease, or unspecified chronic kidney disease: Secondary | ICD-10-CM | POA: Diagnosis not present

## 2015-06-09 DIAGNOSIS — N183 Chronic kidney disease, stage 3 (moderate): Secondary | ICD-10-CM | POA: Diagnosis not present

## 2015-06-09 DIAGNOSIS — J45909 Unspecified asthma, uncomplicated: Secondary | ICD-10-CM | POA: Diagnosis not present

## 2015-06-09 DIAGNOSIS — M6281 Muscle weakness (generalized): Secondary | ICD-10-CM | POA: Diagnosis not present

## 2015-06-09 DIAGNOSIS — F332 Major depressive disorder, recurrent severe without psychotic features: Secondary | ICD-10-CM | POA: Diagnosis not present

## 2015-06-09 DIAGNOSIS — D860 Sarcoidosis of lung: Secondary | ICD-10-CM | POA: Diagnosis not present

## 2015-06-09 NOTE — Progress Notes (Signed)
   Subjective:    [name], female    DOB: 18-Apr-1966, 49 y.o.   MRN: [id number]  HPI  49 yo female smoker with known hx of COPD and OSA/OHS  with chronic respiratory failure. Known diastolic dysfunction with a tendency toward fluid overload.  Polysubstance abuse with cocaine use -now in remission She had a transbronchial biopsy performed in [place] in the 90s-that made the diagnosis of sarcoidosis,  06/09/2015  Chief Complaint  Patient presents with  . Follow-up    was in hospital last week due to respiratory failure; having a lot of SOB on 3L continuous; chest congestion, cough, pnd   Wt 285 - decreased On macitentan and tadalafil - finally got meds approved On torsemide 50 bid & metalazone 2.5 Uses 4L o2 at rest & more on exertion Off cigs since 02/2015 , weaning chantix She is compliant with her CPAP by report  ACE level in May was 100.  Swelling is less in legs, wt is down 8 lbs since last ov.  Wt at home 284 . Denies chest pain, orthopnea, or edema or fever .  She desaturates on walking and needs 5 L of oxygen to recover   Significant tests/ events Echo 11/2014 normal LV function, grade 1 diastolic dysfunction, RVSP 106-dilated RV and severely reduced RV function Cath 03/17/15 >> PVR 5.5 , PCWP mean 20, RA 16, RV 73/22, PA 70/32, CI 2.7 (fick) RHC 06/01/15 >> RA 5, pCWP 17, PVR 3.6 WU, CI 2.6, PA 47/27 -much improved  PSG 01/2011 units corrected by BiPAP 17/6 with 2 L oxygen blended PSG 01/2008 RDI 17 per hour  CT angiogram 03/16/15-no PE, stable nodular airspace disease at both lung bases, underlying emphysema borderline enlarged lymph nodes-stable compared to HRCT 11/2014, slight worse compared to 12/2013 and 07/2013  CT angio 04/19/15 >> neg PE, RUL opacity ? Aspiration  PFTs 05/2010 moderate airway obstruction -FEV1 46% , moderate intraparenchymal restriction , DLCO 23%       Review of Systems neg for any significant sore throat, dysphagia,  itching, sneezing, nasal congestion or excess/ purulent secretions, fever, chills, sweats, unintended wt loss, pleuritic or exertional cp, hempoptysis, orthopnea pnd or change in chronic leg swelling. Also denies presyncope, palpitations, heartburn, abdominal pain, nausea, vomiting, diarrhea or change in bowel or urinary habits, dysuria,hematuria, rash, arthralgias, visual complaints, headache, numbness weakness or ataxia.     Objective:   Physical Exam  Gen. Pleasant, obese, in no distress, normal affect ENT - no lesions, no post nasal drip, class 2-3 airway Neck: No JVD, no thyromegaly, no carotid bruits Lungs: no use of accessory muscles, no dullness to percussion, decreased without rales or rhonchi  Cardiovascular: Rhythm regular, heart sounds  normal, no murmurs or gallops, no peripheral edema Abdomen: soft and non-tender, no hepatosplenomegaly, BS normal. Musculoskeletal: No deformities, no cyanosis or clubbing Neuro:  alert, non focal, no tremors         Assessment & Plan:

## 2015-06-09 NOTE — Assessment & Plan Note (Signed)
Congratulations on quitting smoking! OK to wean off chantix when done Use O2 4-6 L when walking Flu shot

## 2015-06-09 NOTE — Patient Instructions (Signed)
Congratulations on quitting smoking! OK to wean off chantix when done Use O2 4-6 L when walking Flu shot

## 2015-06-09 NOTE — Assessment & Plan Note (Signed)
Do not think this is active disease, although note ACE level was elevated in may

## 2015-06-10 ENCOUNTER — Ambulatory Visit (INDEPENDENT_AMBULATORY_CARE_PROVIDER_SITE_OTHER): Payer: Medicare Other | Admitting: Internal Medicine

## 2015-06-10 ENCOUNTER — Other Ambulatory Visit: Payer: Self-pay | Admitting: Internal Medicine

## 2015-06-10 ENCOUNTER — Other Ambulatory Visit (INDEPENDENT_AMBULATORY_CARE_PROVIDER_SITE_OTHER): Payer: Medicare Other

## 2015-06-10 ENCOUNTER — Telehealth: Payer: Self-pay | Admitting: Pulmonary Disease

## 2015-06-10 ENCOUNTER — Encounter: Payer: Self-pay | Admitting: Internal Medicine

## 2015-06-10 VITALS — BP 112/70 | HR 112 | Temp 97.7°F | Ht 68.0 in | Wt 283.8 lb

## 2015-06-10 DIAGNOSIS — IMO0002 Reserved for concepts with insufficient information to code with codable children: Secondary | ICD-10-CM

## 2015-06-10 DIAGNOSIS — I5032 Chronic diastolic (congestive) heart failure: Secondary | ICD-10-CM

## 2015-06-10 DIAGNOSIS — I270 Primary pulmonary hypertension: Secondary | ICD-10-CM

## 2015-06-10 DIAGNOSIS — F32A Depression, unspecified: Secondary | ICD-10-CM

## 2015-06-10 DIAGNOSIS — E1165 Type 2 diabetes mellitus with hyperglycemia: Secondary | ICD-10-CM

## 2015-06-10 DIAGNOSIS — J439 Emphysema, unspecified: Secondary | ICD-10-CM | POA: Diagnosis not present

## 2015-06-10 DIAGNOSIS — F329 Major depressive disorder, single episode, unspecified: Secondary | ICD-10-CM | POA: Diagnosis not present

## 2015-06-10 DIAGNOSIS — I272 Pulmonary hypertension, unspecified: Secondary | ICD-10-CM

## 2015-06-10 LAB — LIPID PANEL
CHOL/HDL RATIO: 6
Cholesterol: 297 mg/dL — ABNORMAL HIGH (ref 0–200)
HDL: 52.1 mg/dL (ref >39.00)
NONHDL: 245.09
TRIGLYCERIDES: 308 mg/dL — AB (ref 0.0–149.0)
VLDL: 61.6 mg/dL — AB (ref 0.0–40.0)

## 2015-06-10 LAB — MICROALBUMIN / CREATININE URINE RATIO
Creatinine,U: 207.2 mg/dL
Microalb Creat Ratio: 1.4 mg/g (ref 0.0–30.0)
Microalb, Ur: 3 mg/dL — ABNORMAL HIGH (ref 0.0–1.9)

## 2015-06-10 LAB — LDL CHOLESTEROL, DIRECT: Direct LDL: 209 mg/dL

## 2015-06-10 MED ORDER — VENLAFAXINE HCL ER 37.5 MG PO CP24: 37.5000 mg | ORAL_CAPSULE | Freq: Every day | ORAL | Status: AC

## 2015-06-10 MED ORDER — INSULIN GLARGINE 100 UNIT/ML SOLOSTAR PEN: 50.0000 [IU] | PEN_INJECTOR | Freq: Two times a day (BID) | SUBCUTANEOUS | Status: DC

## 2015-06-10 MED ORDER — ALPRAZOLAM 1 MG PO TABS: 1.0000 mg | ORAL_TABLET | Freq: Three times a day (TID) | ORAL | Status: AC

## 2015-06-10 MED ORDER — CETIRIZINE HCL 10 MG PO TABS: 10.0000 mg | ORAL_TABLET | Freq: Every day | ORAL | Status: DC

## 2015-06-10 MED ORDER — METOLAZONE 2.5 MG PO TABS: 2.5000 mg | ORAL_TABLET | ORAL | Status: DC

## 2015-06-10 NOTE — Progress Notes (Signed)
Pre visit review using our clinic review tool, if applicable. No additional management support is needed unless otherwise documented below in the visit note. 

## 2015-06-10 NOTE — Telephone Encounter (Signed)
Called and spoke to pt. Pt is requesting a new CPAP machine as the current one is 48 years old and states it is not working as well as it use to, she is waking up more tired than her normal.   Dr. Elsworth Soho please advise. Thanks.

## 2015-06-10 NOTE — Patient Instructions (Signed)
It was good to see you today.  We have reviewed your prior records including labs and tests today  Test(s) ordered today. Your results will be released to [facility] (or called to you) after review, usually within 72hours after test completion. If any changes need to be made, you will be notified at that same time.  Medications reviewed and updated Resume low-dose Effexor 37-1/2 mg daily, continue current Xanax Increase Lantus to 50 units twice daily No other medication changes recommended today. Your prescription(s) have been submitted to your pharmacy. Please take as directed and contact our office if you believe you are having problem(s) with the medication(s).  we'll make referral to ophthalmology for eye exam and podiatry for foot exam. Our office will contact you regarding appointment(s) once made.  Please schedule followup in 3-4 months, call sooner if problems.

## 2015-06-10 NOTE — Progress Notes (Signed)
Subjective:    [name], female    DOB: Dec 20, 1965, 49 y.o.   MRN: [id number]  HPI  Patient here for hospital follow-up. Reviewed discharge summary dated 9/6 and subsequent follow-up since. Interval events, chronic issues and current concerns reviewed at length today  Past Medical History  Diagnosis Date  . Hypertension   . Hyperlipidemia   . Chronic headache   . Fibromyalgia     daily narcotics  . Anxiety     hx chronic BZ use, stopped 07/2010  . Pulmonary sarcoidosis     unimpressive CT chest 2011  . Colonic polyp   . GERD (gastroesophageal reflux disease)   . ALLERGIC RHINITIS   . Asthma   . CHF (congestive heart failure)     Diastolic with fluid overload, May, 2012, LVEF 60%  . Morbid obesity   . Depression   . Panic attacks   . Diabetes mellitus   . COPD (chronic obstructive pulmonary disease)     on home O2, moderate airflow obstruction, suspect d/t emphysema  . Obesity   . Elevated LFTs 09/2011  . Ovarian cyst   . Chronic back pain   . Sleep apnea      CPAP  . Fatty liver   . Esophagitis   . Gastritis   . Internal hemorrhoids   . Adenomatous colon polyp 12/03/09    [facility] in [place], [place]    Review of Systems  Constitutional: Positive for fatigue. Negative for unexpected weight change.  Respiratory: Positive for shortness of breath (chronic, unchanged). Negative for cough.   Cardiovascular: Positive for palpitations. Negative for chest pain and leg swelling.       Objective:    Physical Exam  Constitutional: She appears well-developed and well-nourished. No distress.  obese  Cardiovascular: Normal rate, regular rhythm and normal heart sounds.   No murmur heard. Pulmonary/Chest: Effort normal and breath sounds normal. No respiratory distress.  Musculoskeletal: She exhibits no edema.  Vitals reviewed.   BP 112/70 mmHg  Pulse 112  Temp(Src) 97.7 F (36.5 C) (Oral)  Ht 5\' 8"  (1.727 m)  Wt 283 lb 12 oz (128.708 kg)   BMI 43.15 kg/m2  SpO2 97%  LMP 10/16/2012 Wt Readings from Last 3 Encounters:  06/10/15 283 lb 12 oz (128.708 kg)  06/09/15 285 lb 12.8 oz (129.638 kg)  06/03/15 290 lb 5.5 oz (131.7 kg)     Lab Results  Component Value Date   WBC 8.2 06/02/2015   HGB 12.7 06/02/2015   HCT 38.8 06/02/2015   PLT 228 06/02/2015   GLUCOSE 171* 06/03/2015   CHOL 262* 04/06/2014   TRIG 229.0* 04/06/2014   HDL 38.00* 04/06/2014   LDLDIRECT 204.3 09/03/2013   LDLCALC 178* 04/06/2014   ALT 42 06/01/2015   AST 25 06/01/2015   NA 130* 06/03/2015   K 4.2 06/03/2015   CL 89* 06/03/2015   CREATININE 1.26* 06/03/2015   BUN 32* 06/03/2015   CO2 32 06/03/2015   TSH 0.429 04/19/2015   INR 0.95 05/31/2015   HGBA1C 9.9* 05/31/2015   MICROALBUR 0.4 04/06/2014    Dg Chest 2 View  05/30/2015   CLINICAL DATA:  Asthma, COPD.  Emphysema.  EXAM: CHEST  2 VIEW  COMPARISON:  Radiograph 05/26/2015, CT 04/19/2015  FINDINGS: Normal cardiac silhouette. There is linear interstitial markings not changed from prior. No focal consolidation. No pneumothorax.  IMPRESSION: No change in a linear interstitial markings suggesting a combination interstitial edema and chronic interstitial lung  disease.   Electronically Signed   By: Suzy Bouchard M.D.   On: 05/30/2015 15:57   Dg Ankle Complete Left  05/30/2015   CLINICAL DATA:  49 year old female with left ankle pain since yesterday evening.  EXAM: LEFT ANKLE COMPLETE - 3+ VIEW  COMPARISON:  04/19/2015  FINDINGS: Multiple views of the left ankle demonstrate no acute displaced fracture, subluxation or dislocation. Diffuse soft tissue swelling around the ankle joint.  IMPRESSION: Diffuse soft tissue swelling around the left ankle joint, without acute bony abnormality.   Electronically Signed   By: [name] M.D.   On: 05/30/2015 15:56   Dg Knee Complete 4 Views Right  05/30/2015   CLINICAL DATA:  Knee pain.  EXAM: RIGHT KNEE - COMPLETE 4+ VIEW  COMPARISON:  None.  FINDINGS: No  acute fracture or dislocation. Tricompartmental osteoarthritis of the right knee. Large joint effusion. No lytic or sclerotic osseous lesion.  IMPRESSION: 1. Tricompartmental osteoarthritis of the right knee. 2. Large joint effusion.   Electronically Signed   By: Kathreen Devoid   On: 05/30/2015 17:18       Assessment & Plan:   Problem List Items Addressed This Visit    Chronic diastolic CHF (congestive heart failure) (Chronic)    freq exacerbations complicated with multifactorial disease and pulmonary overlap Last hospitalization reviewed Chronic high dose diuretic -  Hx zaroxlyn 3x/week 07/2012 but stopped due to ARF with over diuresis; then resumed 05/2015 Also encouraged CPAP compliance and O2 continuous for chronic resp failure Continue monitor sodium intake and monitor weights at home - call if increase weight >3#/day Encouragement provided on general weight loss efforts follow up cards and pulm as planned      Relevant Medications   metolazone (ZAROXOLYN) 2.5 MG tablet   COPD (chronic obstructive pulmonary disease) with emphysema    Frequent acute exacerbations, multifactorial with COPD, diastolic heart failure, right-sided heart failure, pulmonary sarcoidosis -recent discontinuation of tobacco abuse reviewed Recent hospitalization for same reviewed -currently at baseline continue bipap at home, on nasal mask since 10/14 hosp Reviewed patient's commitment to compliance with medication and equipment instruction baseline hypercarbia reviewed ongoing chronic inhalers (spiriva and symbicort) with prn nebulizer continue home oxygen and followup with pulmonary and cardiology specialists as planned encouraged never to smoke again!      Relevant Medications   cetirizine (ZYRTEC) 10 MG tablet   Depression    Chronic BZ - remotely klonopin but ongoing scheduled xanax Also overlap with anxiety symptoms Exacerbated by resp faillure and air hunger Also exacerbated by freq/high dose  steroids rx'd sertraline 08/2011 - increased dose to 100 mg daily in 05/2013 and changed benzodiazepine: Xanax back to previous Klonopin -  Also previously on venlafaxine, changed to cymb alata then to Wellbutrin Solo Wellbutrin at this time not sufficient so will resume low dose Effexor as well Extensive support offered today, denies SI/HI      Relevant Medications   ALPRAZolam (XANAX) 1 MG tablet   venlafaxine XR (EFFEXOR XR) 37.5 MG 24 hr capsule   Diabetes type 2, uncontrolled - Primary (Chronic)    New dx 11/2011 - exacerbated by frequent steroid use for bronchospastic pulmonary disease Intol of metformin due to severe diarrhea side effects  On Lantus pen BID, Novolog TID AC - titrate as neede rec increase in Lantus 50U BID -changes in dose reviewed today Declined to start low dose Victoza 10/2013, but started same 04/2015 as rx'd also to assist with patient's weight loss goals for controlling appetite?  check a1c q3-54mo and titrate as needed Lab Results  Component Value Date   HGBA1C 9.9* 05/31/2015        Relevant Medications   Insulin Glargine (LANTUS SOLOSTAR) 100 UNIT/ML Solostar Pen   Other Relevant Orders   Ambulatory referral to Podiatry   Ambulatory referral to Ophthalmology   Lipid panel   Microalbumin / creatinine urine ratio   Pulmonary HTN    R heart cath 06/01/15: improved to mild pulm HTN Continue Macitenan as begun 04/2015 and follow up pulm as planned; also Adcirca Continue O2 and HF med mgmt      Relevant Medications   metolazone (ZAROXOLYN) 2.5 MG tablet       Gwendolyn Grant, MD

## 2015-06-10 NOTE — Assessment & Plan Note (Signed)
Chronic BZ - remotely klonopin but ongoing scheduled xanax Also overlap with anxiety symptoms Exacerbated by resp faillure and air hunger Also exacerbated by freq/high dose steroids rx'd sertraline 08/2011 - increased dose to 100 mg daily in 05/2013 and changed benzodiazepine: Xanax back to previous Klonopin -  Also previously on venlafaxine, changed to cymb alata then to Wellbutrin Solo Wellbutrin at this time not sufficient so will resume low dose Effexor as well Extensive support offered today, denies SI/HI

## 2015-06-10 NOTE — Assessment & Plan Note (Signed)
freq exacerbations complicated with multifactorial disease and pulmonary overlap Last hospitalization reviewed Chronic high dose diuretic -  Hx zaroxlyn 3x/week 07/2012 but stopped due to ARF with over diuresis; then resumed 05/2015 Also encouraged CPAP compliance and O2 continuous for chronic resp failure Continue monitor sodium intake and monitor weights at home - call if increase weight >3#/day Encouragement provided on general weight loss efforts follow up cards and pulm as planned

## 2015-06-10 NOTE — Assessment & Plan Note (Addendum)
R heart cath 06/01/15: improved to mild pulm HTN Continue Macitenan as begun 04/2015 and follow up pulm as planned; also Adcirca Continue O2 and HF med mgmt

## 2015-06-10 NOTE — Assessment & Plan Note (Signed)
Frequent acute exacerbations, multifactorial with COPD, diastolic heart failure, right-sided heart failure, pulmonary sarcoidosis -recent discontinuation of tobacco abuse reviewed Recent hospitalization for same reviewed -currently at baseline continue bipap at home, on nasal mask since 10/14 hosp Reviewed patient's commitment to compliance with medication and equipment instruction baseline hypercarbia reviewed ongoing chronic inhalers (spiriva and symbicort) with prn nebulizer continue home oxygen and followup with pulmonary and cardiology specialists as planned encouraged never to smoke again! 

## 2015-06-10 NOTE — Assessment & Plan Note (Signed)
New dx 11/2011 - exacerbated by frequent steroid use for bronchospastic pulmonary disease Intol of metformin due to severe diarrhea side effects  On Lantus pen BID, Novolog TID AC - titrate as neede rec increase in Lantus 50U BID -changes in dose reviewed today Declined to start low dose Victoza 10/2013, but started same 04/2015 as rx'd also to assist with patient's weight loss goals for controlling appetite? check a1c q3-100mo and titrate as needed Lab Results  Component Value Date   HGBA1C 9.9* 05/31/2015

## 2015-06-11 ENCOUNTER — Ambulatory Visit (HOSPITAL_COMMUNITY): Payer: Self-pay

## 2015-06-11 NOTE — Telephone Encounter (Signed)
Obtain download to confirm complaince beofre ordering

## 2015-06-11 NOTE — Telephone Encounter (Signed)
Download requested, patient says that [employer] came out yesterday to obtain download Patient notified that we are awaiting the download and once we have reviewed the download we will call her back Hold in chart, I will close encounter once I have received download

## 2015-06-14 ENCOUNTER — Encounter (HOSPITAL_COMMUNITY): Payer: Self-pay

## 2015-06-15 ENCOUNTER — Telehealth: Payer: Self-pay | Admitting: Pulmonary Disease

## 2015-06-15 NOTE — Telephone Encounter (Signed)
Sharyn Lull, have you received this? thanks

## 2015-06-15 NOTE — Telephone Encounter (Signed)
Good usage on 12/9cm No residuals Mild leak + No changes

## 2015-06-16 ENCOUNTER — Other Ambulatory Visit: Payer: Self-pay | Admitting: Internal Medicine

## 2015-06-16 NOTE — Telephone Encounter (Signed)
Patient says she just wanted to get an upgrade. Per Dr. Elsworth Soho, the machines have not changed much during the past 5 years and as long as her machine is working okay, she doesn't need an upgrade Patient notified. Nothing further needed.

## 2015-06-16 NOTE — Telephone Encounter (Signed)
Called spoke with pt. She is aware of results. Pt reports she has current machine for 5 years and wants a new one ordered. Please advise Dr. Elsworth Soho thanks

## 2015-06-16 NOTE — Telephone Encounter (Signed)
See TE 06/10/2015

## 2015-06-16 NOTE — Telephone Encounter (Signed)
Received download as below:  ------------------------------------- Good usage on 12/9cm No residuals Mild leak + No changes ------------------------------------  Left message for patient to call back.

## 2015-06-16 NOTE — Telephone Encounter (Signed)
Current machine is working well No need for new one in my opinion

## 2015-06-17 ENCOUNTER — Encounter (HOSPITAL_COMMUNITY): Payer: Self-pay

## 2015-06-18 ENCOUNTER — Inpatient Hospital Stay (HOSPITAL_COMMUNITY): Admission: RE | Admit: 2015-06-18 | Payer: Self-pay | Source: Ambulatory Visit

## 2015-06-18 ENCOUNTER — Telehealth (HOSPITAL_COMMUNITY): Payer: Self-pay

## 2015-06-18 NOTE — Telephone Encounter (Signed)
Call received from patient stating she has decided to continue home physical therapy and can't commit to pulmonary rehab at this time. Patients orientation appt will be cancelled.

## 2015-06-21 ENCOUNTER — Telehealth: Payer: Self-pay | Admitting: Internal Medicine

## 2015-06-21 ENCOUNTER — Encounter (HOSPITAL_COMMUNITY): Payer: Self-pay

## 2015-06-21 DIAGNOSIS — J9819 Other pulmonary collapse: Secondary | ICD-10-CM | POA: Diagnosis not present

## 2015-06-21 NOTE — Telephone Encounter (Signed)
Pt called and she is wondering since she is allergic to her other cholesterol medication, if you can prescribe her crestor Her pharmacy is [facility] on [address]

## 2015-06-22 ENCOUNTER — Ambulatory Visit (HOSPITAL_COMMUNITY)
Admission: RE | Admit: 2015-06-22 | Discharge: 2015-06-22 | Disposition: A | Discharge status: Home / Self Care | Payer: Medicare Other | Source: Ambulatory Visit | Attending: Internal Medicine | Admitting: Internal Medicine

## 2015-06-22 VITALS — BP 102/50 | HR 122 | Wt 283.0 lb

## 2015-06-22 DIAGNOSIS — Z882 Allergy status to sulfonamides status: Secondary | ICD-10-CM | POA: Insufficient documentation

## 2015-06-22 DIAGNOSIS — Z8249 Family history of ischemic heart disease and other diseases of the circulatory system: Secondary | ICD-10-CM | POA: Insufficient documentation

## 2015-06-22 DIAGNOSIS — Z825 Family history of asthma and other chronic lower respiratory diseases: Secondary | ICD-10-CM | POA: Insufficient documentation

## 2015-06-22 DIAGNOSIS — E785 Hyperlipidemia, unspecified: Secondary | ICD-10-CM | POA: Diagnosis not present

## 2015-06-22 DIAGNOSIS — Z79899 Other long term (current) drug therapy: Secondary | ICD-10-CM | POA: Insufficient documentation

## 2015-06-22 DIAGNOSIS — Z87891 Personal history of nicotine dependence: Secondary | ICD-10-CM | POA: Diagnosis not present

## 2015-06-22 DIAGNOSIS — I5032 Chronic diastolic (congestive) heart failure: Secondary | ICD-10-CM | POA: Diagnosis not present

## 2015-06-22 DIAGNOSIS — I509 Heart failure, unspecified: Secondary | ICD-10-CM

## 2015-06-22 DIAGNOSIS — N189 Chronic kidney disease, unspecified: Secondary | ICD-10-CM | POA: Insufficient documentation

## 2015-06-22 DIAGNOSIS — I272 Other secondary pulmonary hypertension: Secondary | ICD-10-CM | POA: Insufficient documentation

## 2015-06-22 DIAGNOSIS — I270 Primary pulmonary hypertension: Secondary | ICD-10-CM | POA: Diagnosis not present

## 2015-06-22 DIAGNOSIS — I50810 Right heart failure, unspecified: Secondary | ICD-10-CM

## 2015-06-22 DIAGNOSIS — I471 Supraventricular tachycardia: Secondary | ICD-10-CM

## 2015-06-22 DIAGNOSIS — Z794 Long term (current) use of insulin: Secondary | ICD-10-CM | POA: Insufficient documentation

## 2015-06-22 DIAGNOSIS — E662 Morbid (severe) obesity with alveolar hypoventilation: Secondary | ICD-10-CM | POA: Diagnosis not present

## 2015-06-22 DIAGNOSIS — J9611 Chronic respiratory failure with hypoxia: Secondary | ICD-10-CM | POA: Diagnosis not present

## 2015-06-22 DIAGNOSIS — Z823 Family history of stroke: Secondary | ICD-10-CM | POA: Diagnosis not present

## 2015-06-22 DIAGNOSIS — I129 Hypertensive chronic kidney disease with stage 1 through stage 4 chronic kidney disease, or unspecified chronic kidney disease: Secondary | ICD-10-CM | POA: Diagnosis not present

## 2015-06-22 DIAGNOSIS — G4733 Obstructive sleep apnea (adult) (pediatric): Secondary | ICD-10-CM | POA: Insufficient documentation

## 2015-06-22 DIAGNOSIS — Z91040 Latex allergy status: Secondary | ICD-10-CM | POA: Diagnosis not present

## 2015-06-22 DIAGNOSIS — J449 Chronic obstructive pulmonary disease, unspecified: Secondary | ICD-10-CM | POA: Diagnosis not present

## 2015-06-22 DIAGNOSIS — Z9981 Dependence on supplemental oxygen: Secondary | ICD-10-CM | POA: Insufficient documentation

## 2015-06-22 DIAGNOSIS — Z7982 Long term (current) use of aspirin: Secondary | ICD-10-CM | POA: Diagnosis not present

## 2015-06-22 DIAGNOSIS — Z833 Family history of diabetes mellitus: Secondary | ICD-10-CM | POA: Diagnosis not present

## 2015-06-22 DIAGNOSIS — R000 Tachycardia, unspecified: Secondary | ICD-10-CM

## 2015-06-22 MED ORDER — ROSUVASTATIN CALCIUM 10 MG PO TABS: 10.0000 mg | ORAL_TABLET | Freq: Every day | ORAL | Status: AC

## 2015-06-22 MED ORDER — TADALAFIL (PAH) 20 MG PO TABS: 2.0000 | ORAL_TABLET | Freq: Every day | ORAL | Status: AC

## 2015-06-22 NOTE — Telephone Encounter (Signed)
Crestor 10 mg once daily sent to your pharmacy. Please call if problems or concerns related to the medication. Otherwise, will recheck cholesterol next visit Thanks for the note

## 2015-06-22 NOTE — Progress Notes (Signed)
[name], female   DOB: [birth year], 49 y.o.   MRN: [id number] Primary Care: Asa Lente Primary Cardiologist: Aundra Dubin  HPI:  [name] is a 49 y.o. with a PMH of OHS/OSA on Cpap and home oxygen, chronic diastolic CHF with prominent RV dysfunction, pulmonary hypertension, Fibromyalgia, Pulmonary sarcoidosis, HTN, dyslipidemia, and COPD. She was hospitalized for acute on chronic diastolic CHF had RHC in 04/8415 (see below) with plans to place her on ERA (macitentan) after left heart filling pressure optimized as there is some evidence for ERA use in sarcoidosis-related PAH.   She was admitted on 04/18/15 for SOB and palpitations.  Pt was placed on IV lasix with -19 L of output during that admission.  CT of the chest showed new infiltrate along with fever and she was also treated empirically for HCAP. Her weight on discharge was 292 lbs. She was admitted again in 9/16 with acute on chronic diastolic CHF.  She was diuresed and sent home.   She is now on macitentan and Adcirca 20 mg daily.  Weight is down 7 lbs.  She complains of fatigue but is less short of breath.  She walked in from the parking deck and did ok. No dyspnea walking in her house. No chest pain or orthopnea.  No lightheadedness.  LDL was very high recently and PCP started Crestor.    6 minute walk (8/16): 180 ft/55 m 6 minute walk (9/16): 680 ft/207 m  ECG: sinus tachy at 104, poor RWP  Labs 7/16 K 4.1, creatinine 1.18, HCT 40.4 8/16 K 3.4, creatinine 1.37 9/16 K 4.2, creatinine 1.26, LDL 209, HDL 52  PMH: 1. Chronic Diastolic CHF: RHC 02/28/29 with RA mean 16; PA 70/32, mean 47; PCWP mean 20; Cardiac Index (Thermo) 2.07; PVR 5.5 WU.  Echo (3/16) with EF 65-70%, D-shaped interventricular septum, moderately dilated RV with severely reduced systolic function, PASP 160 mmHg. RHC (9/16) with mean RA 5, PA 47/27 mean 36, mean PCWP 17, CI 2.18/PVR 3.6 Fick, CI 3.66/PVR 3.0 thermo.  2. Pulmonary arterial HTN: See RHC  above.  RV dysfunction as per echo above. V/Q scan in 3/16 concerning for COPD versus chronic PE, Repeat CTAs negative for PE. There is definitely a contribution from COPD and OHS/OSA (group 3). However, there could be a contribution from sarcoidosis. 3. Pulmonary Sarcoidosis: Last CT in 7/16 showed chronic centrilobular and paraseptal emphysematous changes as well as the reticulonodular peribronchovascular opacities which are likely associated with the known sarcoidosis. 4. HTN 5. Dyslipidemia 6. COPD 7. OHS/OSA: Home oxygen and CPAP.  8. GERD 9. Obesity 10. CKD  Review of Systems: All systems reviewed and negative except as per HPI.   Current Outpatient Prescriptions  Medication Sig Dispense Refill  . albuterol (PROAIR HFA) 108 (90 BASE) MCG/ACT inhaler Inhale 2 puffs into the lungs every 6 (six) hours as needed for wheezing or shortness of breath. 8.5 g 5  . ALPRAZolam (XANAX) 1 MG tablet Take 1 tablet (1 mg total) by mouth 3 (three) times daily. 90 tablet 2  . aspirin EC 81 MG tablet Take 81 mg by mouth daily.    Marland Kitchen BIOTIN PO Take 1 capsule by mouth daily.    Marland Kitchen buPROPion (WELLBUTRIN XL) 150 MG 24 hr tablet Take 1 tablet (150 mg total) by mouth daily. 30 tablet 2  . cetirizine (ZYRTEC) 1 MG/ML syrup TAKE 10 ML(10 MG) BY MOUTH AT BEDTIME 300 mL 0  . colchicine 0.6 MG tablet Take 1 tablet (0.6 mg  total) by mouth 2 (two) times daily. 60 tablet 0  . esomeprazole (NEXIUM) 40 MG capsule Take 40 mg by mouth 2 (two) times daily before a meal.    . fluticasone (FLONASE) 50 MCG/ACT nasal spray Place 1 spray into both nostrils daily as needed for allergies or rhinitis.    Marland Kitchen gabapentin (NEURONTIN) 300 MG capsule Take 300 mg by mouth daily as needed (neuropathy pain).     Marland Kitchen glimepiride (AMARYL) 1 MG tablet Take 1 mg by mouth daily with breakfast.    . HYDROcodone-acetaminophen (NORCO/VICODIN) 5-325 MG per tablet Take 1 tablet by mouth 4 (four) times daily.     . insulin aspart (NOVOLOG FLEXPEN) 100  UNIT/ML FlexPen Sliding Scale: 15 units = 250 - 300, 20 units = 301 and above 15 mL 11  . Insulin Glargine (LANTUS SOLOSTAR) 100 UNIT/ML Solostar Pen Inject 50 Units into the skin 2 (two) times daily. 15 pen 2  . Liraglutide (VICTOZA) 18 MG/3ML SOPN Inject 3 mLs into the skin daily.    . Macitentan 10 MG TABS Take 10 mg by mouth daily. 30 tablet 1  . metolazone (ZAROXOLYN) 2.5 MG tablet Take 1 tablet (2.5 mg total) by mouth 3 (three) times a week. 30 tablet 3  . omega-3 fish oil (MAXEPA) 1000 MG CAPS capsule Take 1 capsule by mouth daily.    . potassium chloride SA (K-DUR,KLOR-CON) 20 MEQ tablet Take 2 tablets (40 mEq total) by mouth every evening. 60 tablet 0  . promethazine (PHENERGAN) 25 MG tablet TAKE 1 TABLET BY MOUTH EVERY 6 HOURS AS NEEDED FOR NAUSEA AND VOMITING 30 tablet 0  . rosuvastatin (CRESTOR) 10 MG tablet Take 1 tablet (10 mg total) by mouth daily. 90 tablet 3  . Tadalafil, PAH, (ADCIRCA) 20 MG TABS Take 2 tablets (40 mg total) by mouth daily. 180 tablet 3  . tiZANidine (ZANAFLEX) 2 MG tablet Take 2 mg by mouth 2 (two) times daily as needed for muscle spasms.   3  . torsemide (DEMADEX) 100 MG tablet Take 1 tablet (100 mg total) by mouth 2 (two) times daily. 180 tablet 3  . venlafaxine XR (EFFEXOR XR) 37.5 MG 24 hr capsule Take 1 capsule (37.5 mg total) by mouth daily. 30 capsule 2   No current facility-administered medications for this encounter.    Allergies  Allergen Reactions  . Simvastatin Other (See Comments)    Severe leg pain and burning  . Sulfamethoxazole-Trimethoprim Nausea And Vomiting    Causes Projectile Vomiting  . Zolpidem Tartrate Other (See Comments)    Chest pain  . Azithromycin Other (See Comments)    Resistant to med  . Ceftin [Cefuroxime] Swelling and Other (See Comments)    MOUTH SWELLING  . Doxycycline Other (See Comments)    Resistant to med  . Latex Itching and Rash    Also burning sensations  . Metformin And Related Diarrhea    Severe  diarrhea  . Metronidazole Nausea And Vomiting  . Morphine-Naltrexone Palpitations  . Ciprofloxacin Nausea Only  . Tramadol Itching and Nausea Only    Social History   Social History  . Marital Status: Legally Separated    Spouse Name: N/A  . Number of Children: N/A  . Years of Education: N/A   Occupational History  . Not on file.   Social History Main Topics  . Smoking status: Former Smoker -- 0.25 packs/day for 29 years    Types: Cigarettes    Quit date: 11/24/2014  . Smokeless tobacco:  Never Used  . Alcohol Use: No     Comment: occasionally  . Drug Use: Yes    Special: Cocaine, Marijuana     Comment: remote cocaine; current marijuana  . Sexual Activity: Not on file   Other Topics Concern  . Not on file   Social History Narrative   Pt is single. No children. On disability   Family History  Problem Relation Age of Onset  . Hypertension Mother   . Heart disease Mother   . Emphysema Father   . Hypertension Father   . Stomach cancer Father   . Heart disease Father   . Allergies Brother   . Stomach cancer Brother   . Congestive Heart Failure Brother   . Diabetes type II Brother   . Heart disease Sister   . Diabetes type II Sister   . Heart attack Sister   . Stroke Sister   . Hypertension Sister   . Heart failure Sister     diastolic  . Hypertension Sister     Danley Danker Vitals:   06/22/15 1100  BP: 102/50  Pulse: 122  Weight: 283 lb (128.368 kg)  SpO2: 88%    PHYSICAL EXAM: General:  NAD. On 02 via [place]. Obese.  HEENT: normal Neck: supple. Thick, difficult exam but JVP does not seem significantly elevated. Carotids 2+ bilat; no bruits. No lymphadenopathy or thryomegaly appreciated. Cor: PMI nondisplaced. Regular rate & rhythm. No rubs, gallops.  2/6 HSM LLSB. Lungs: Diminished throughout. Abdomen: Obese, soft, nontender, nondistended. No hepatosplenomegaly. No bruits or masses. Good bowel sounds. Extremities: no cyanosis, clubbing, rash. No edema.   Neuro: alert & oriented x 3, cranial nerves grossly intact. moves all 4 extremities w/o difficulty. Affect pleasant.  ASSESSMENT & PLAN: 1. 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 She has quit smoking on Chantix. She has OHS/OSA on oxygen during the day and CPAP at night. She also has biopsy-proven sarcoidosis. She has had some changes on chest CT indicative of possible sarcoidosis progression, but per last pulmonary note this does not seem to be her primary problem at this time. She has not been on steroids for sarcoidosis chronically. V/Q scan in 3/16 concerning for COPD versus chronic PE, but she has had several chest CTAs that are not indicative of acute or chronic PE.  - Continue oxygen and CPAP for OHS/OSA.  - I encouraged her to do pulmonary rehab.  2. Pulmonary hypertension: Multiple possible reasons for pulmonary hypertension. PA systolic pressure estimate 106 mmHg by last echo. [place] 6/16 showed severe pulmonary arterial hypertension with some pulmonary venous hypertension => RA 16, PA 70/32 mean 46, PCWP 20, PVR 5.5. There is definitely a contribution from COPD and OHS/OSA (group 3). However, there could be a contribution from the co-existing pulmonary sarcoidosis. 9/16 RHC on tadalafil and macitentan was considerably improved.  6 minute walk today also looked better.  - Continue macitentan. - Increase tadalafil to 40 mg daily.  3. Chronic diastolic CHF with prominent RV dysfunction: Severely reduced RV function on echo. R>L heart failure on cath in 6/16.Symptoms NYHA class III.  She has lost weight and volume status looks ok.   - Continue torsemide 100 mg po bid and continue metolazone M/W/Sat (three times a week).  She will take an extra KCl 40 on metolazone days.    4. HTN: Stable currently. 5. Dyslipidemia 6. COPD: No longer smoking.  7. CKD: Follow creatinine closely with diuresis, ok recently.  8. Hyperlipidemia: Very high  LDL, statin recently started.  Repeat lipids/LFTs in 2 months.                                                                                                                                                                                                                          Loralie Champagne 06/22/2015

## 2015-06-22 NOTE — Patient Instructions (Signed)
INCREASE Tadalafil to 40 mg (2 tabs) daily  Your physician recommends that you schedule a follow-up appointment in: 1 month  Do the following things EVERYDAY: 1) Weigh yourself in the morning before breakfast. Write it down and keep it in a log. 2) Take your medicines as prescribed 3) Eat low salt foods-Limit salt (sodium) to 2000 mg per day.  4) Stay as active as you can everyday 5) Limit all fluids for the day to less than 2 liters 6)

## 2015-06-22 NOTE — Telephone Encounter (Signed)
Pt informed and agreed

## 2015-06-22 NOTE — Progress Notes (Signed)
6MW completed today with patient, pt ambulated 690 ft( 210.3 meters) Rest breaks x 1 HR ranged from 119-132 Oxygen sats ranged from 80-92% on 5L of oxygen

## 2015-06-23 DIAGNOSIS — M25569 Pain in unspecified knee: Secondary | ICD-10-CM | POA: Diagnosis not present

## 2015-06-23 DIAGNOSIS — G894 Chronic pain syndrome: Secondary | ICD-10-CM | POA: Diagnosis not present

## 2015-06-23 DIAGNOSIS — Z79899 Other long term (current) drug therapy: Secondary | ICD-10-CM | POA: Diagnosis not present

## 2015-06-23 DIAGNOSIS — M170 Bilateral primary osteoarthritis of knee: Secondary | ICD-10-CM | POA: Diagnosis not present

## 2015-06-23 DIAGNOSIS — M47817 Spondylosis without myelopathy or radiculopathy, lumbosacral region: Secondary | ICD-10-CM | POA: Diagnosis not present

## 2015-06-23 DIAGNOSIS — M1288 Other specific arthropathies, not elsewhere classified, other specified site: Secondary | ICD-10-CM | POA: Diagnosis not present

## 2015-06-27 ENCOUNTER — Other Ambulatory Visit: Payer: Self-pay | Admitting: Internal Medicine

## 2015-06-28 ENCOUNTER — Encounter: Payer: Self-pay | Admitting: Internal Medicine

## 2015-06-29 ENCOUNTER — Encounter: Payer: Self-pay | Admitting: Pulmonary Disease

## 2015-07-01 DIAGNOSIS — J449 Chronic obstructive pulmonary disease, unspecified: Secondary | ICD-10-CM | POA: Diagnosis not present

## 2015-07-01 DIAGNOSIS — R269 Unspecified abnormalities of gait and mobility: Secondary | ICD-10-CM | POA: Diagnosis not present

## 2015-07-01 DIAGNOSIS — D869 Sarcoidosis, unspecified: Secondary | ICD-10-CM | POA: Diagnosis not present

## 2015-07-01 DIAGNOSIS — E11618 Type 2 diabetes mellitus with other diabetic arthropathy: Secondary | ICD-10-CM | POA: Diagnosis not present

## 2015-07-01 DIAGNOSIS — J9690 Respiratory failure, unspecified, unspecified whether with hypoxia or hypercapnia: Secondary | ICD-10-CM | POA: Diagnosis not present

## 2015-07-06 ENCOUNTER — Other Ambulatory Visit: Payer: Self-pay

## 2015-07-06 DIAGNOSIS — M1288 Other specific arthropathies, not elsewhere classified, other specified site: Secondary | ICD-10-CM | POA: Diagnosis not present

## 2015-07-06 DIAGNOSIS — M47817 Spondylosis without myelopathy or radiculopathy, lumbosacral region: Secondary | ICD-10-CM | POA: Diagnosis not present

## 2015-07-06 DIAGNOSIS — M545 Low back pain: Secondary | ICD-10-CM | POA: Diagnosis not present

## 2015-07-06 MED ORDER — GABAPENTIN 300 MG PO CAPS: 300.0000 mg | ORAL_CAPSULE | Freq: Every day | ORAL | Status: AC

## 2015-07-09 ENCOUNTER — Telehealth (HOSPITAL_COMMUNITY): Payer: Self-pay

## 2015-07-09 NOTE — Telephone Encounter (Signed)
I have called and left a message with Kammie to inquire about participation in Pulmonary Rehab per Dr. Claris Gladden referral. [facility]

## 2015-07-14 ENCOUNTER — Other Ambulatory Visit: Payer: Self-pay | Admitting: Internal Medicine

## 2015-07-17 ENCOUNTER — Other Ambulatory Visit: Payer: Self-pay | Admitting: Internal Medicine

## 2015-07-19 ENCOUNTER — Other Ambulatory Visit: Payer: Self-pay | Admitting: Internal Medicine

## 2015-07-20 ENCOUNTER — Telehealth: Payer: Self-pay | Admitting: Internal Medicine

## 2015-07-20 NOTE — Telephone Encounter (Signed)
Patient states that she lost her symbicort inhaler. Insurance will not cover it until 08/03/2015. Patient does not know what to do at this point. Please review and advise patient

## 2015-07-21 ENCOUNTER — Other Ambulatory Visit: Payer: Self-pay

## 2015-07-21 DIAGNOSIS — M170 Bilateral primary osteoarthritis of knee: Secondary | ICD-10-CM | POA: Diagnosis not present

## 2015-07-21 DIAGNOSIS — G894 Chronic pain syndrome: Secondary | ICD-10-CM | POA: Diagnosis not present

## 2015-07-21 DIAGNOSIS — J9819 Other pulmonary collapse: Secondary | ICD-10-CM | POA: Diagnosis not present

## 2015-07-21 DIAGNOSIS — M79606 Pain in leg, unspecified: Secondary | ICD-10-CM | POA: Diagnosis not present

## 2015-07-21 DIAGNOSIS — M47817 Spondylosis without myelopathy or radiculopathy, lumbosacral region: Secondary | ICD-10-CM | POA: Diagnosis not present

## 2015-07-21 DIAGNOSIS — Z79899 Other long term (current) drug therapy: Secondary | ICD-10-CM | POA: Diagnosis not present

## 2015-07-21 DIAGNOSIS — M1288 Other specific arthropathies, not elsewhere classified, other specified site: Secondary | ICD-10-CM | POA: Diagnosis not present

## 2015-07-21 DIAGNOSIS — M25569 Pain in unspecified knee: Secondary | ICD-10-CM | POA: Diagnosis not present

## 2015-07-21 MED ORDER — BUPROPION HCL ER (XL) 150 MG PO TB24: 150.0000 mg | ORAL_TABLET | Freq: Every day | ORAL | Status: AC

## 2015-07-21 NOTE — Telephone Encounter (Signed)
Pt informed of MD response.  

## 2015-07-21 NOTE — Telephone Encounter (Signed)
You are welcome to check if we or pulmonary have sample of this medication. Otherwise, continue on without Symbicort medication until covered on November 8, and continue using albuterol rescue inhaler every 4 hours as needed

## 2015-07-22 MED ORDER — BUPROPION HCL ER (XL) 150 MG PO TB24: 150.0000 mg | ORAL_TABLET | Freq: Every day | ORAL | Status: DC

## 2015-07-22 NOTE — Addendum Note (Signed)
Addended by: Earnstine Regal on: 07/22/2015 11:23 AM   Modules accepted: Orders, Medications

## 2015-07-22 NOTE — Telephone Encounter (Signed)
Rx for wellbutrin was suppose to be sent electronically to [facility] but it was printed. Resending electronically to [facility]...[name]

## 2015-07-23 ENCOUNTER — Encounter (HOSPITAL_COMMUNITY): Payer: Self-pay

## 2015-07-27 ENCOUNTER — Telehealth (HOSPITAL_COMMUNITY): Payer: Self-pay

## 2015-07-28 ENCOUNTER — Telehealth: Payer: Self-pay | Admitting: Internal Medicine

## 2015-07-28 DIAGNOSIS — M25561 Pain in right knee: Secondary | ICD-10-CM | POA: Diagnosis not present

## 2015-07-28 NOTE — Telephone Encounter (Signed)
Pt is a patient of Dr. Asa Lente and is wondering if you can take her on as her PCP Please advise

## 2015-07-30 ENCOUNTER — Ambulatory Visit (HOSPITAL_COMMUNITY): Payer: Self-pay

## 2015-08-01 NOTE — Telephone Encounter (Signed)
No, I am not available for that

## 2015-08-02 ENCOUNTER — Telehealth (HOSPITAL_COMMUNITY): Payer: Self-pay

## 2015-08-02 NOTE — Telephone Encounter (Signed)
Pt informed

## 2015-08-03 ENCOUNTER — Encounter: Payer: Self-pay | Admitting: Internal Medicine

## 2015-08-05 DIAGNOSIS — M1288 Other specific arthropathies, not elsewhere classified, other specified site: Secondary | ICD-10-CM | POA: Diagnosis not present

## 2015-08-05 DIAGNOSIS — M545 Low back pain: Secondary | ICD-10-CM | POA: Diagnosis not present

## 2015-08-09 ENCOUNTER — Encounter (HOSPITAL_COMMUNITY): Payer: Self-pay | Admitting: Emergency Medicine

## 2015-08-09 ENCOUNTER — Ambulatory Visit: Payer: Self-pay | Admitting: Adult Health

## 2015-08-09 ENCOUNTER — Other Ambulatory Visit: Payer: Self-pay

## 2015-08-09 ENCOUNTER — Emergency Department (INDEPENDENT_AMBULATORY_CARE_PROVIDER_SITE_OTHER)
Admission: EM | Admit: 2015-08-09 | Discharge: 2015-08-09 | Disposition: A | Discharge status: Home / Self Care | Payer: Medicare Other | Source: Home / Self Care | Attending: Family Medicine | Admitting: Family Medicine

## 2015-08-09 ENCOUNTER — Other Ambulatory Visit: Payer: Self-pay | Admitting: Internal Medicine

## 2015-08-09 DIAGNOSIS — M5136 Other intervertebral disc degeneration, lumbar region: Secondary | ICD-10-CM

## 2015-08-09 DIAGNOSIS — G8929 Other chronic pain: Secondary | ICD-10-CM

## 2015-08-09 DIAGNOSIS — M545 Low back pain: Secondary | ICD-10-CM | POA: Diagnosis not present

## 2015-08-09 MED ORDER — CYCLOBENZAPRINE HCL 5 MG PO TABS: 5.0000 mg | ORAL_TABLET | Freq: Three times a day (TID) | ORAL | Status: AC | PRN

## 2015-08-09 MED ORDER — CETIRIZINE HCL 1 MG/ML PO SYRP: 5.0000 mg | ORAL_SOLUTION | Freq: Every day | ORAL | Status: AC

## 2015-08-09 MED ORDER — INSULIN ASPART 100 UNIT/ML FLEXPEN: PEN_INJECTOR | SUBCUTANEOUS | Status: AC

## 2015-08-09 NOTE — Discharge Instructions (Signed)
Chronic Back Pain For the next 1-2 days may increase sure oxycodone to 1.5 tablets. Had Flexeril 5 mg 3 times a day for muscle relaxation. Apply heating pad as directed. Do not go to sleep with a heating pad on your back. [facility] doctor tomorrow and notify him of your visit and tear increased pain.  When back pain lasts longer than 3 months, it is called chronic back pain.People with chronic back pain often go through certain periods that are more intense (flare-ups).  CAUSES Chronic back pain can be caused by wear and tear (degeneration) on different structures in your back. These structures include:  The bones of your spine (vertebrae) and the joints surrounding your spinal cord and nerve roots (facets).  The strong, fibrous tissues that connect your vertebrae (ligaments). Degeneration of these structures may result in pressure on your nerves. This can lead to constant pain. HOME CARE INSTRUCTIONS  Avoid bending, heavy lifting, prolonged sitting, and activities which make the problem worse.  Take brief periods of rest throughout the day to reduce your pain. Lying down or standing usually is better than sitting while you are resting.  Take over-the-counter or prescription medicines only as directed by your caregiver. SEEK IMMEDIATE MEDICAL CARE IF:   You have weakness or numbness in one of your legs or feet.  You have trouble controlling your bladder or bowels.  You have nausea, vomiting, abdominal pain, shortness of breath, or fainting.   This information is not intended to replace advice given to you by your health care provider. Make sure you discuss any questions you have with your health care provider.   Document Released: 10/19/2004 Document Revised: 12/04/2011 Document Reviewed: 03/01/2015 Elsevier Interactive Patient Education [employer].

## 2015-08-09 NOTE — ED Provider Notes (Signed)
CSN: [id number]     Arrival date & time 08/09/15  1955 History   First MD Initiated Contact with Patient 08/09/15 2034     Chief Complaint  Patient presents with  . Back Pain   (Consider location/radiation/quality/duration/timing/severity/associated sxs/prior Treatment) HPI Comments: 49 year old morbidly obese female has a history of chronic discogenic low back pain. She receives injections periodically but states since the last injection 3-4 days ago she has had increased pain. There is a vertical scar over the lower lumbar spine. The pain circumscribes this area. It radiates bilaterally. Nothing is making it better. She is currently taking oxycodone 7.5 milligrams for pain. She has a history of hypertension, dyslipidemia, pulmonary sarcoidosis, CHF, O2 dependent COPD, type 2 diabetes mellitus, fatty liver, gastritis and esophagitis, sleep apnea and chronic low back pain. Denies distal paresthesias or weakness.  Patient is a 49 y.o. female presenting with back pain.  Back Pain Associated symptoms: no fever     Past Medical History  Diagnosis Date  . Hypertension   . Hyperlipidemia   . Chronic headache   . Fibromyalgia     daily narcotics  . Anxiety     hx chronic BZ use, stopped 07/2010  . Pulmonary sarcoidosis ([place])     unimpressive CT chest 2011  . Colonic polyp   . GERD (gastroesophageal reflux disease)   . ALLERGIC RHINITIS   . Asthma   . CHF (congestive heart failure) (HCC)     Diastolic with fluid overload, May, 2012, LVEF 60%  . Morbid obesity ([place])   . Depression   . Panic attacks   . Diabetes mellitus   . COPD (chronic obstructive pulmonary disease) (H[facility])     on home O2, moderate airflow obstruction, suspect d/t emphysema  . Obesity   . Elevated LFTs 09/2011  . Ovarian cyst   . Chronic back pain   . Sleep apnea      CPAP  . Fatty liver   . Esophagitis   . Gastritis   . Internal hemorrhoids   . Adenomatous colon polyp 12/03/09    [facility] in  [place], [place]   Past Surgical History  Procedure Laterality Date  . Polypectomy  2011  . Lumbar microdiscectomy  07/06/2011    R L4-5, stern  . Back surgery    . Carpal tunnel release    . Steroid spinal injections    . Hysteroscopy w/d&c N/A 03/25/2013    Procedure: DILATATION AND CURETTAGE /HYSTEROSCOPY;  Surgeon: [name], MD;  Location: [facility];  Service: Gynecology;  Laterality: N/A;  . Dilation and curettage of uterus    . Cardiac catheterization N/A 03/17/2015    Procedure: Right Heart Cath;  Surgeon: Larey Dresser, MD;  Location: [facility];  Service: Cardiovascular;  Laterality: N/A;  . Cardiac catheterization N/A 06/01/2015    Procedure: Right Heart Cath;  Surgeon: Larey Dresser, MD;  Location: [place] [facility];  Service: Cardiovascular;  Laterality: N/A;   Family History  Problem Relation Age of Onset  . Hypertension Mother   . Heart disease Mother   . Emphysema Father   . Hypertension Father   . Stomach cancer Father   . Heart disease Father   . Allergies Brother   . Stomach cancer Brother   . Congestive Heart Failure Brother   . Diabetes type II Brother   . Heart disease Sister   . Diabetes type II Sister   . Heart attack Sister   . Stroke Sister   .  Hypertension Sister   . Heart failure Sister     diastolic  . Hypertension Sister    Social History  Substance Use Topics  . Smoking status: Former Smoker -- 0.25 packs/day for 29 years    Types: Cigarettes    Quit date: 11/24/2014  . Smokeless tobacco: Never Used  . Alcohol Use: No     Comment: occasionally   OB History    No data available     Review of Systems  Constitutional: Positive for activity change. Negative for fever.  HENT: Negative.   Respiratory: Negative.        Patient has baseline dyspnea and is O2 dependent. She has no increase in respiratory distress beyond her baseline.  Musculoskeletal: Positive for myalgias and back pain.  Skin: Negative.   Neurological:  Negative.   All other systems reviewed and are negative.   Allergies  Simvastatin; Sulfamethoxazole-trimethoprim; Zolpidem tartrate; Azithromycin; Ceftin; Doxycycline; Latex; Metformin and related; Metronidazole; Morphine-naltrexone; Ciprofloxacin; and Tramadol  Home Medications   Prior to Admission medications   Medication Sig Start Date End Date Taking? Authorizing Provider  albuterol (PROAIR HFA) 108 (90 BASE) MCG/ACT inhaler Inhale 2 puffs into the lungs every 6 (six) hours as needed for wheezing or shortness of breath. 05/01/15   Rowe Clack, MD  ALPRAZolam Duanne Moron) 1 MG tablet Take 1 tablet (1 mg total) by mouth 3 (three) times daily. 06/10/15   Rowe Clack, MD  aspirin EC 81 MG tablet Take 81 mg by mouth daily.    Historical Provider, MD  BIOTIN PO Take 1 capsule by mouth daily.    Historical Provider, MD  buPROPion (WELLBUTRIN XL) 150 MG 24 hr tablet Take 1 tablet (150 mg total) by mouth daily. 07/21/15   Rowe Clack, MD  buPROPion (WELLBUTRIN XL) 150 MG 24 hr tablet Take 1 tablet (150 mg total) by mouth daily. 07/22/15   Rowe Clack, MD  cetirizine (ZYRTEC) 1 MG/ML syrup Take 5 mLs (5 mg total) by mouth daily. 08/09/15   Rowe Clack, MD  colchicine 0.6 MG tablet Take 1 tablet (0.6 mg total) by mouth 2 (two) times daily. 06/01/15   Allie Bossier, MD  cyclobenzaprine (FLEXERIL) 5 MG tablet Take 1 tablet (5 mg total) by mouth 3 (three) times daily as needed for muscle spasms. 08/09/15   Janne Napoleon, NP  esomeprazole (NEXIUM) 40 MG capsule Take 1 capsule (40 mg total) by mouth 2 (two) times daily. 07/14/15   Rowe Clack, MD  fluticasone (FLONASE) 50 MCG/ACT nasal spray Place 1 spray into both nostrils daily as needed for allergies or rhinitis.    Historical Provider, MD  gabapentin (NEURONTIN) 300 MG capsule Take 1 capsule (300 mg total) by mouth daily. 07/06/15   Rowe Clack, MD  glimepiride (AMARYL) 1 MG tablet Take 1 mg by mouth daily with  breakfast.    Historical Provider, MD  HYDROcodone-acetaminophen (NORCO/VICODIN) 5-325 MG per tablet Take 1 tablet by mouth 4 (four) times daily.     Historical Provider, MD  insulin aspart (NOVOLOG FLEXPEN) 100 UNIT/ML FlexPen Sliding Scale: 15 units = 250 - 300, 20 units = 301 and above 08/09/15   Rowe Clack, MD  Insulin Glargine (LANTUS SOLOSTAR) 100 UNIT/ML Solostar Pen Inject 50 Units into the skin 2 (two) times daily. 07/19/15   Rowe Clack, MD  Liraglutide (VICTOZA) 18 MG/3ML SOPN Inject 3 mLs into the skin daily.    Historical Provider, MD  Macitentan 10  MG TABS Take 10 mg by mouth daily. 04/11/15   Barton Dubois, MD  metolazone (ZAROXOLYN) 2.5 MG tablet Take 1 tablet (2.5 mg total) by mouth 3 (three) times a week. 06/10/15   Rowe Clack, MD  omega-3 fish oil (MAXEPA) 1000 MG CAPS capsule Take 1 capsule by mouth daily.    Historical Provider, MD  potassium chloride SA (K-DUR,KLOR-CON) 20 MEQ tablet Take 2 tablets (40 mEq total) by mouth every evening. 06/03/15   Allie Bossier, MD  promethazine (PHENERGAN) 25 MG tablet TAKE 1 TABLET BY MOUTH EVERY 6 HOURS AS NEEDED FOR NAUSEA AND VOMITING 06/10/15   Rowe Clack, MD  rosuvastatin (CRESTOR) 10 MG tablet Take 1 tablet (10 mg total) by mouth daily. 06/22/15   Rowe Clack, MD  Tadalafil, PAH, (ADCIRCA) 20 MG TABS Take 2 tablets (40 mg total) by mouth daily. 06/22/15   Larey Dresser, MD  tiZANidine (ZANAFLEX) 2 MG tablet Take 2 mg by mouth 2 (two) times daily as needed for muscle spasms.  03/10/15   Historical Provider, MD  torsemide (DEMADEX) 100 MG tablet Take 1 tablet (100 mg total) by mouth 2 (two) times daily. 05/17/15   Rowe Clack, MD  venlafaxine XR (EFFEXOR XR) 37.5 MG 24 hr capsule Take 1 capsule (37.5 mg total) by mouth daily. 06/10/15   Rowe Clack, MD   Meds Ordered and Administered this Visit  Medications - No data to display  BP 150/89 mmHg  Pulse 111  Temp(Src) 97.9 F (36.6 C) (Oral)   Resp 18  SpO2 94%  LMP 10/16/2012 No data found.   Physical Exam  Constitutional: She appears well-developed and well-nourished. No distress.  Cardiovascular:  Apical tachycardia 111  Pulmonary/Chest: No respiratory distress.  Musculoskeletal:  There is a vertical scar over the lower lumbar spine. There is tenderness to the para lumbar musculature. No swelling.  Neurological: She is alert. She exhibits normal muscle tone.  Skin: Skin is warm and dry.  Nursing note and vitals reviewed.   ED Course  Procedures (including critical care time)  Labs Review Labs Reviewed - No data to display  Imaging Review No results found.   Visual Acuity Review  Right Eye Distance:   Left Eye Distance:   Bilateral Distance:    Right Eye Near:   Left Eye Near:    Bilateral Near:         MDM   1. Chronic low back pain   2. Discogenic low back pain    For the next 1-2 days may increase sure oxycodone to 1.5 tablets.  Flexeril 5 mg 3 times a day for muscle relaxation. Apply heating pad as directed. Do not go to sleep with a heating pad on your back. [facility] doctor tomorrow and notify him of your visit and tear increased pain.     Janne Napoleon, NP 08/09/15 2056

## 2015-08-10 ENCOUNTER — Other Ambulatory Visit: Payer: Self-pay | Admitting: Internal Medicine

## 2015-08-10 ENCOUNTER — Other Ambulatory Visit: Payer: Self-pay | Admitting: *Deleted

## 2015-08-11 ENCOUNTER — Emergency Department (HOSPITAL_COMMUNITY): Payer: Medicare Other

## 2015-08-11 ENCOUNTER — Other Ambulatory Visit: Payer: Self-pay | Admitting: Internal Medicine

## 2015-08-11 ENCOUNTER — Encounter (HOSPITAL_COMMUNITY): Payer: Self-pay | Admitting: Emergency Medicine

## 2015-08-11 ENCOUNTER — Observation Stay (HOSPITAL_COMMUNITY)
Admission: EM | Admit: 2015-08-11 | Discharge: 2015-08-26 | Disposition: E | Payer: Medicare Other | Attending: Internal Medicine | Admitting: Internal Medicine

## 2015-08-11 ENCOUNTER — Telehealth: Payer: Self-pay | Admitting: Internal Medicine

## 2015-08-11 DIAGNOSIS — J9610 Chronic respiratory failure, unspecified whether with hypoxia or hypercapnia: Secondary | ICD-10-CM | POA: Diagnosis present

## 2015-08-11 DIAGNOSIS — G4733 Obstructive sleep apnea (adult) (pediatric): Secondary | ICD-10-CM | POA: Diagnosis present

## 2015-08-11 DIAGNOSIS — F419 Anxiety disorder, unspecified: Secondary | ICD-10-CM | POA: Insufficient documentation

## 2015-08-11 DIAGNOSIS — Z881 Allergy status to other antibiotic agents status: Secondary | ICD-10-CM | POA: Diagnosis not present

## 2015-08-11 DIAGNOSIS — K219 Gastro-esophageal reflux disease without esophagitis: Secondary | ICD-10-CM | POA: Diagnosis not present

## 2015-08-11 DIAGNOSIS — J449 Chronic obstructive pulmonary disease, unspecified: Secondary | ICD-10-CM | POA: Insufficient documentation

## 2015-08-11 DIAGNOSIS — Z882 Allergy status to sulfonamides status: Secondary | ICD-10-CM | POA: Insufficient documentation

## 2015-08-11 DIAGNOSIS — N179 Acute kidney failure, unspecified: Secondary | ICD-10-CM | POA: Diagnosis present

## 2015-08-11 DIAGNOSIS — E860 Dehydration: Secondary | ICD-10-CM | POA: Diagnosis not present

## 2015-08-11 DIAGNOSIS — F329 Major depressive disorder, single episode, unspecified: Secondary | ICD-10-CM | POA: Diagnosis present

## 2015-08-11 DIAGNOSIS — Z794 Long term (current) use of insulin: Secondary | ICD-10-CM | POA: Diagnosis not present

## 2015-08-11 DIAGNOSIS — I272 Other secondary pulmonary hypertension: Secondary | ICD-10-CM | POA: Diagnosis not present

## 2015-08-11 DIAGNOSIS — D860 Sarcoidosis of lung: Secondary | ICD-10-CM | POA: Diagnosis present

## 2015-08-11 DIAGNOSIS — I50810 Right heart failure, unspecified: Secondary | ICD-10-CM

## 2015-08-11 DIAGNOSIS — M545 Low back pain, unspecified: Secondary | ICD-10-CM | POA: Diagnosis present

## 2015-08-11 DIAGNOSIS — N183 Chronic kidney disease, stage 3 unspecified: Secondary | ICD-10-CM | POA: Diagnosis present

## 2015-08-11 DIAGNOSIS — E785 Hyperlipidemia, unspecified: Secondary | ICD-10-CM | POA: Diagnosis present

## 2015-08-11 DIAGNOSIS — E1122 Type 2 diabetes mellitus with diabetic chronic kidney disease: Secondary | ICD-10-CM | POA: Diagnosis not present

## 2015-08-11 DIAGNOSIS — I130 Hypertensive heart and chronic kidney disease with heart failure and stage 1 through stage 4 chronic kidney disease, or unspecified chronic kidney disease: Secondary | ICD-10-CM | POA: Insufficient documentation

## 2015-08-11 DIAGNOSIS — M797 Fibromyalgia: Secondary | ICD-10-CM | POA: Insufficient documentation

## 2015-08-11 DIAGNOSIS — I509 Heart failure, unspecified: Secondary | ICD-10-CM | POA: Insufficient documentation

## 2015-08-11 DIAGNOSIS — M549 Dorsalgia, unspecified: Secondary | ICD-10-CM | POA: Diagnosis not present

## 2015-08-11 DIAGNOSIS — F332 Major depressive disorder, recurrent severe without psychotic features: Secondary | ICD-10-CM | POA: Diagnosis present

## 2015-08-11 DIAGNOSIS — G8929 Other chronic pain: Secondary | ICD-10-CM | POA: Diagnosis not present

## 2015-08-11 DIAGNOSIS — IMO0002 Reserved for concepts with insufficient information to code with codable children: Secondary | ICD-10-CM | POA: Diagnosis present

## 2015-08-11 DIAGNOSIS — J45909 Unspecified asthma, uncomplicated: Secondary | ICD-10-CM | POA: Insufficient documentation

## 2015-08-11 DIAGNOSIS — Z86010 Personal history of colonic polyps: Secondary | ICD-10-CM | POA: Insufficient documentation

## 2015-08-11 DIAGNOSIS — Z7982 Long term (current) use of aspirin: Secondary | ICD-10-CM | POA: Insufficient documentation

## 2015-08-11 DIAGNOSIS — E1165 Type 2 diabetes mellitus with hyperglycemia: Secondary | ICD-10-CM | POA: Insufficient documentation

## 2015-08-11 DIAGNOSIS — Z885 Allergy status to narcotic agent status: Secondary | ICD-10-CM | POA: Insufficient documentation

## 2015-08-11 DIAGNOSIS — Z9981 Dependence on supplemental oxygen: Secondary | ICD-10-CM | POA: Diagnosis not present

## 2015-08-11 DIAGNOSIS — R4689 Other symptoms and signs involving appearance and behavior: Secondary | ICD-10-CM | POA: Diagnosis present

## 2015-08-11 DIAGNOSIS — F32A Depression, unspecified: Secondary | ICD-10-CM | POA: Diagnosis present

## 2015-08-11 DIAGNOSIS — Z888 Allergy status to other drugs, medicaments and biological substances status: Secondary | ICD-10-CM | POA: Insufficient documentation

## 2015-08-11 DIAGNOSIS — E662 Morbid (severe) obesity with alveolar hypoventilation: Secondary | ICD-10-CM | POA: Diagnosis present

## 2015-08-11 DIAGNOSIS — Z91040 Latex allergy status: Secondary | ICD-10-CM | POA: Diagnosis not present

## 2015-08-11 DIAGNOSIS — I10 Essential (primary) hypertension: Secondary | ICD-10-CM | POA: Diagnosis present

## 2015-08-11 DIAGNOSIS — R0602 Shortness of breath: Secondary | ICD-10-CM | POA: Diagnosis not present

## 2015-08-11 DIAGNOSIS — E876 Hypokalemia: Secondary | ICD-10-CM | POA: Diagnosis present

## 2015-08-11 DIAGNOSIS — Z9119 Patient's noncompliance with other medical treatment and regimen: Secondary | ICD-10-CM | POA: Diagnosis not present

## 2015-08-11 DIAGNOSIS — J439 Emphysema, unspecified: Secondary | ICD-10-CM | POA: Diagnosis present

## 2015-08-11 DIAGNOSIS — F411 Generalized anxiety disorder: Secondary | ICD-10-CM | POA: Diagnosis present

## 2015-08-11 DIAGNOSIS — R739 Hyperglycemia, unspecified: Secondary | ICD-10-CM

## 2015-08-11 HISTORY — DX: Chronic kidney disease, stage 3 (moderate): N18.3

## 2015-08-11 HISTORY — DX: Chronic kidney disease, stage 3 unspecified: N18.30

## 2015-08-11 LAB — CBC WITH DIFFERENTIAL/PLATELET
BASOS PCT: 0 %
Basophils Absolute: 0 10*3/uL (ref 0.0–0.1)
EOS ABS: 0.2 10*3/uL (ref 0.0–0.7)
EOS PCT: 2 %
HCT: 43.9 % (ref 36.0–46.0)
Hemoglobin: 14.1 g/dL (ref 12.0–15.0)
Lymphocytes Relative: 15 %
Lymphs Abs: 1.5 10*3/uL (ref 0.7–4.0)
MCH: 25.2 pg — ABNORMAL LOW (ref 26.0–34.0)
MCHC: 32.1 g/dL (ref 30.0–36.0)
MCV: 78.5 fL (ref 78.0–100.0)
MONO ABS: 0.5 10*3/uL (ref 0.1–1.0)
MONOS PCT: 5 %
Neutro Abs: 7.7 10*3/uL (ref 1.7–7.7)
Neutrophils Relative %: 78 %
Platelets: 233 10*3/uL (ref 150–400)
RBC: 5.59 MIL/uL — ABNORMAL HIGH (ref 3.87–5.11)
RDW: 15.9 % — AB (ref 11.5–15.5)
WBC: 9.8 10*3/uL (ref 4.0–10.5)

## 2015-08-11 LAB — COMPREHENSIVE METABOLIC PANEL
ALBUMIN: 3.7 g/dL (ref 3.5–5.0)
ALT: 47 U/L (ref 14–54)
ANION GAP: 18 — AB (ref 5–15)
AST: 42 U/L — ABNORMAL HIGH (ref 15–41)
Alkaline Phosphatase: 123 U/L (ref 38–126)
BILIRUBIN TOTAL: 0.6 mg/dL (ref 0.3–1.2)
BUN: 26 mg/dL — ABNORMAL HIGH (ref 6–20)
CO2: 27 mmol/L (ref 22–32)
Calcium: 9 mg/dL (ref 8.9–10.3)
Chloride: 81 mmol/L — ABNORMAL LOW (ref 101–111)
Creatinine, Ser: 1.62 mg/dL — ABNORMAL HIGH (ref 0.44–1.00)
GFR calc Af Amer: 42 mL/min — ABNORMAL LOW (ref >60)
GFR calc non Af Amer: 37 mL/min — ABNORMAL LOW (ref >60)
GLUCOSE: 444 mg/dL — AB (ref 65–99)
POTASSIUM: 2.9 mmol/L — AB (ref 3.5–5.1)
SODIUM: 126 mmol/L — AB (ref 135–145)
Total Protein: 7.2 g/dL (ref 6.5–8.1)

## 2015-08-11 LAB — URINALYSIS, ROUTINE W REFLEX MICROSCOPIC
Bilirubin Urine: NEGATIVE
Glucose, UA: >1000 mg/dL — AB
Hgb urine dipstick: NEGATIVE
KETONES UR: NEGATIVE mg/dL
LEUKOCYTES UA: NEGATIVE
NITRITE: NEGATIVE
PH: 5.5 (ref 5.0–8.0)
Protein, ur: NEGATIVE mg/dL
Specific Gravity, Urine: 1.017 (ref 1.005–1.030)

## 2015-08-11 LAB — CBG MONITORING, ED
Glucose-Capillary: 459 mg/dL — ABNORMAL HIGH (ref 65–99)
Glucose-Capillary: 500 mg/dL — ABNORMAL HIGH (ref 65–99)

## 2015-08-11 LAB — URINE MICROSCOPIC-ADD ON

## 2015-08-11 LAB — I-STAT TROPONIN, ED: TROPONIN I, POC: 0 ng/mL (ref 0.00–0.08)

## 2015-08-11 LAB — PREGNANCY, URINE: PREG TEST UR: NEGATIVE

## 2015-08-11 MED ORDER — INSULIN ASPART 100 UNIT/ML ~~LOC~~ SOLN
10.0000 [IU] | Freq: Once | SUBCUTANEOUS | Status: AC
  Administered 2015-08-11: 10 [IU] via INTRAVENOUS
  Filled 2015-08-11: qty 1

## 2015-08-11 MED ORDER — POTASSIUM CHLORIDE 10 MEQ/100ML IV SOLN
10.0000 meq | INTRAVENOUS | Status: AC
  Administered 2015-08-11 (×2): 10 meq via INTRAVENOUS
  Filled 2015-08-11 (×2): qty 100

## 2015-08-11 MED ORDER — OXYCODONE-ACETAMINOPHEN 5-325 MG PO TABS
2.0000 | ORAL_TABLET | Freq: Once | ORAL | Status: AC
  Administered 2015-08-11: 2 via ORAL
  Filled 2015-08-11: qty 2

## 2015-08-11 MED ORDER — SODIUM CHLORIDE 0.9 % IV BOLUS (SEPSIS)
250.0000 mL | Freq: Once | INTRAVENOUS | Status: AC
  Administered 2015-08-11: 250 mL via INTRAVENOUS

## 2015-08-11 MED ORDER — TIZANIDINE HCL 2 MG PO TABS: 2.0000 mg | ORAL_TABLET | Freq: Two times a day (BID) | ORAL | Status: DC | PRN

## 2015-08-11 NOTE — ED Notes (Addendum)
Pt st's she is diabetic and has been out of her insulin x's 2 days.  St's her insurance won't pay for it.  CBG in Triage was 450.  Pt c/o feeling shaky.  Pt has chronic shortness of breath and is on home 02 at 4LPM

## 2015-08-11 NOTE — ED Provider Notes (Signed)
CSN: [id number]     Arrival date & time 08/08/2015  1715 History   First MD Initiated Contact with Patient 08/23/2015 2015     Chief Complaint  Patient presents with  . Hyperglycemia     (Consider location/radiation/quality/duration/timing/severity/associated sxs/prior Treatment) HPI Comments: Patient is 49 year old female with a history of hypertension, dyslipidemia, fibromyalgia, CHF (LVEF 65-70%), IDDM, COPD on chronic 4L via [place], and sleep apnea presentsto the emergency department for complaints of polyuria and polydipsia. Patient states that symptoms have been worsening over the past 4 days. She states that she had her insulin regimen refilled on Monday, but her insurance would not cover her NovoLog. She states that she was able to get her Lantus and has been taking her daily morning and evening doses. She reports that she has noted steady increasing in her blood sugar levels over the last few days. She has been urinating more frequently, especially during the night. She states that her urine is foamy. She reports feeling "shaky" at times as well as nauseous. She also reports worsening SOB over the last day or two. She has been on chronic 4L O2 via [place] since September. Patient denies recent weight gain, says she has lost 30 pounds. No worsening leg swelling. No fever or syncope. She has f/u with her cardiologist on Monday. She reports calling her PCP about changes in her insulin regimen, but has yet to hear back from her doctor. Patient reports compliance with all other medications.  Patient is a 49 y.o. female presenting with hyperglycemia. The history is provided by the patient. No language interpreter was used.  Hyperglycemia Associated symptoms: increased thirst, nausea, polyuria and shortness of breath (worse than baseline)   Associated symptoms: no chest pain, no fever and no vomiting     Past Medical History  Diagnosis Date  . Hypertension   . Hyperlipidemia   . Chronic headache   .  Fibromyalgia     daily narcotics  . Anxiety     hx chronic BZ use, stopped 07/2010  . Pulmonary sarcoidosis ([place])     unimpressive CT chest 2011  . Colonic polyp   . GERD (gastroesophageal reflux disease)   . ALLERGIC RHINITIS   . Asthma   . CHF (congestive heart failure) (HCC)     Diastolic with fluid overload, May, 2012, LVEF 60%  . Morbid obesity ([place])   . Depression   . Panic attacks   . Diabetes mellitus   . COPD (chronic obstructive pulmonary disease) (H[facility])     on home O2, moderate airflow obstruction, suspect d/t emphysema  . Obesity   . Elevated LFTs 09/2011  . Ovarian cyst   . Chronic back pain   . Sleep apnea      CPAP  . Fatty liver   . Esophagitis   . Gastritis   . Internal hemorrhoids   . Adenomatous colon polyp 12/03/09    [facility] in [place], [place]   Past Surgical History  Procedure Laterality Date  . Polypectomy  2011  . Lumbar microdiscectomy  07/06/2011    R L4-5, stern  . Back surgery    . Carpal tunnel release    . Steroid spinal injections    . Hysteroscopy w/d&c N/A 03/25/2013    Procedure: DILATATION AND CURETTAGE /HYSTEROSCOPY;  Surgeon: [name], MD;  Location: [facility];  Service: Gynecology;  Laterality: N/A;  . Dilation and curettage of uterus    . Cardiac catheterization N/A 03/17/2015    Procedure: Right Heart  Cath;  Surgeon: Larey Dresser, MD;  Location: [facility];  Service: Cardiovascular;  Laterality: N/A;  . Cardiac catheterization N/A 06/01/2015    Procedure: Right Heart Cath;  Surgeon: Larey Dresser, MD;  Location: [facility];  Service: Cardiovascular;  Laterality: N/A;   Family History  Problem Relation Age of Onset  . Hypertension Mother   . Heart disease Mother   . Emphysema Father   . Hypertension Father   . Stomach cancer Father   . Heart disease Father   . Allergies Brother   . Stomach cancer Brother   . Congestive Heart Failure Brother   . Diabetes type II Brother   . Heart disease Sister    . Diabetes type II Sister   . Heart attack Sister   . Stroke Sister   . Hypertension Sister   . Heart failure Sister     diastolic  . Hypertension Sister    Social History  Substance Use Topics  . Smoking status: Former Smoker -- 0.25 packs/day for 29 years    Types: Cigarettes    Quit date: 11/24/2014  . Smokeless tobacco: Never Used  . Alcohol Use: No     Comment: occasionally   OB History    No data available      Review of Systems  Constitutional: Negative for fever.  Respiratory: Positive for shortness of breath (worse than baseline).   Cardiovascular: Negative for chest pain.  Gastrointestinal: Positive for nausea. Negative for vomiting.  Endocrine: Positive for polydipsia and polyuria.  Neurological: Negative for syncope.  All other systems reviewed and are negative.   Allergies  Simvastatin; Sulfamethoxazole-trimethoprim; Zolpidem tartrate; Azithromycin; Ceftin; Doxycycline; Latex; Metformin and related; Metronidazole; Morphine-naltrexone; Ciprofloxacin; and Tramadol  Home Medications   Prior to Admission medications   Medication Sig Start Date End Date Taking? Authorizing Provider  albuterol (PROAIR HFA) 108 (90 BASE) MCG/ACT inhaler Inhale 2 puffs into the lungs every 6 (six) hours as needed for wheezing or shortness of breath. 05/01/15  Yes Rowe Clack, MD  ALPRAZolam Duanne Moron) 1 MG tablet Take 1 tablet (1 mg total) by mouth 3 (three) times daily. 06/10/15  Yes Rowe Clack, MD  aspirin EC 81 MG tablet Take 81 mg by mouth daily.   Yes Historical Provider, MD  BIOTIN PO Take 1 capsule by mouth daily.   Yes Historical Provider, MD  buPROPion (WELLBUTRIN XL) 150 MG 24 hr tablet Take 1 tablet (150 mg total) by mouth daily. 07/21/15  Yes Rowe Clack, MD  cetirizine (ZYRTEC) 1 MG/ML syrup Take 5 mLs (5 mg total) by mouth daily. 08/09/15  Yes Rowe Clack, MD  colchicine 0.6 MG tablet Take 1 tablet (0.6 mg total) by mouth 2 (two) times daily.  06/01/15  Yes Allie Bossier, MD  esomeprazole (NEXIUM) 40 MG capsule Take 1 capsule (40 mg total) by mouth 2 (two) times daily. 07/14/15  Yes Rowe Clack, MD  fluticasone (FLONASE) 50 MCG/ACT nasal spray Place 1 spray into both nostrils daily as needed for allergies or rhinitis.   Yes Historical Provider, MD  gabapentin (NEURONTIN) 300 MG capsule Take 1 capsule (300 mg total) by mouth daily. 07/06/15  Yes Rowe Clack, MD  glimepiride (AMARYL) 1 MG tablet Take 1 mg by mouth daily with breakfast.   Yes Historical Provider, MD  insulin aspart (NOVOLOG FLEXPEN) 100 UNIT/ML FlexPen Inject 15 Units into the skin 3 (three) times daily with meals. Per sliding scale  Yes Historical Provider, MD  Insulin Glargine (LANTUS SOLOSTAR) 100 UNIT/ML Solostar Pen Inject 50 Units into the skin 2 (two) times daily. 07/19/15  Yes Rowe Clack, MD  Liraglutide (VICTOZA) 18 MG/3ML SOPN Inject 3 mLs into the skin daily.   Yes Historical Provider, MD  Macitentan 10 MG TABS Take 10 mg by mouth daily. 04/11/15  Yes Barton Dubois, MD  metolazone (ZAROXOLYN) 2.5 MG tablet Take 1 tablet (2.5 mg total) by mouth 3 (three) times a week. Patient taking differently: Take 2.5 mg by mouth 3 (three) times a week. Three times a week 06/10/15  Yes Rowe Clack, MD  omega-3 fish oil (MAXEPA) 1000 MG CAPS capsule Take 1 capsule by mouth daily.   Yes Historical Provider, MD  oxyCODONE-acetaminophen (PERCOCET) 7.5-325 MG tablet Take 1 tablet by mouth 4 (four) times daily as needed for moderate pain.   Yes Historical Provider, MD  potassium chloride SA (K-DUR,KLOR-CON) 20 MEQ tablet Take 2 tablets (40 mEq total) by mouth every evening. 06/03/15  Yes Allie Bossier, MD  promethazine (PHENERGAN) 25 MG tablet TAKE 1 TABLET BY MOUTH EVERY 6 HOURS AS NEEDED FOR NAUSEA AND VOMITING 06/10/15  Yes Rowe Clack, MD  rosuvastatin (CRESTOR) 10 MG tablet Take 1 tablet (10 mg total) by mouth daily. 06/22/15  Yes Rowe Clack,  MD  Tadalafil, PAH, (ADCIRCA) 20 MG TABS Take 2 tablets (40 mg total) by mouth daily. 06/22/15  Yes Larey Dresser, MD  tiZANidine (ZANAFLEX) 2 MG tablet Take 1 tablet (2 mg total) by mouth 2 (two) times daily as needed for muscle spasms. 08/24/2015  Yes Rowe Clack, MD  torsemide (DEMADEX) 100 MG tablet Take 1 tablet (100 mg total) by mouth 2 (two) times daily. 05/17/15  Yes Rowe Clack, MD  venlafaxine XR (EFFEXOR XR) 37.5 MG 24 hr capsule Take 1 capsule (37.5 mg total) by mouth daily. 06/10/15  Yes Rowe Clack, MD  buPROPion (WELLBUTRIN XL) 150 MG 24 hr tablet Take 1 tablet (150 mg total) by mouth daily. Patient not taking: Reported on 08/12/2015 07/22/15   Rowe Clack, MD  cyclobenzaprine (FLEXERIL) 5 MG tablet Take 1 tablet (5 mg total) by mouth 3 (three) times daily as needed for muscle spasms. Patient not taking: Reported on 08/01/2015 08/09/15   Janne Napoleon, NP  insulin aspart (NOVOLOG FLEXPEN) 100 UNIT/ML FlexPen Sliding Scale: 15 units = 250 - 300, 20 units = 301 and above Patient not taking: Reported on 08/17/2015 08/09/15   Rowe Clack, MD   BP 117/64 mmHg  Pulse 94  Temp(Src) 98.5 F (36.9 C) (Oral)  Resp 19  Ht 5\' 8"  (1.727 m)  Wt 261 lb 6 oz (118.559 kg)  BMI 39.75 kg/m2  SpO2 95%  LMP 10/16/2012   Physical Exam  Constitutional: She is oriented to person, place, and time. She appears well-developed and well-nourished. No distress.  Nontoxic/nonseptic appearing Pleasant female  HENT:  Head: Normocephalic and atraumatic.  Mouth/Throat: No oropharyngeal exudate.  Dry mm  Eyes: Conjunctivae and EOM are normal. No scleral icterus.  Neck: Normal range of motion.  Cardiovascular: Normal rate, regular rhythm and intact distal pulses.   NSR  Pulmonary/Chest: Effort normal. No respiratory distress. She has no wheezes. She has no rales.  Decreased BS diffusely. No wheezes or rales. SpO2 95% on 4L [place]  Musculoskeletal: Normal range of motion. She  exhibits no edema.  No pitting edema in BLE  Neurological: She is alert and oriented  to person, place, and time. She exhibits normal muscle tone. Coordination normal.  Skin: Skin is warm and dry. No rash noted. She is not diaphoretic. No erythema. No pallor.  Psychiatric: She has a normal mood and affect. Her behavior is normal.  Nursing note and vitals reviewed.   ED Course  Procedures (including critical care time) Labs Review Labs Reviewed  CBC WITH DIFFERENTIAL/PLATELET - Abnormal; Notable for the following:    RBC 5.59 (*)    MCH 25.2 (*)    RDW 15.9 (*)    All other components within normal limits  COMPREHENSIVE METABOLIC PANEL - Abnormal; Notable for the following:    Sodium 126 (*)    Potassium 2.9 (*)    Chloride 81 (*)    Glucose, Bld 444 (*)    BUN 26 (*)    Creatinine, Ser 1.62 (*)    AST 42 (*)    GFR calc non Af Amer 37 (*)    GFR calc Af Amer 42 (*)    Anion gap 18 (*)    All other components within normal limits  URINALYSIS, ROUTINE W REFLEX MICROSCOPIC (NOT AT [facility]) - Abnormal; Notable for the following:    Glucose, UA >1000 (*)    All other components within normal limits  URINE MICROSCOPIC-ADD ON - Abnormal; Notable for the following:    Squamous Epithelial / LPF 0-5 (*)    Bacteria, UA RARE (*)    Casts HYALINE CASTS (*)    All other components within normal limits  CBG MONITORING, ED - Abnormal; Notable for the following:    Glucose-Capillary 459 (*)    All other components within normal limits  CBG MONITORING, ED - Abnormal; Notable for the following:    Glucose-Capillary 500 (*)    All other components within normal limits  CBG MONITORING, ED - Abnormal; Notable for the following:    Glucose-Capillary 369 (*)    All other components within normal limits  PREGNANCY, URINE  I-STAT TROPOININ, ED    Imaging Review Dg Chest 2 View  08/04/2015  CLINICAL DATA:  Shortness of breath. EXAM: CHEST  2 VIEW COMPARISON:  May 30, 2015. FINDINGS:  Stable cardiomediastinal silhouette. No pneumothorax or pleural effusion is noted. Stable bibasilar interstitial densities are noted most consistent with scarring. No acute pulmonary disease is noted. Bony thorax is unremarkable. IMPRESSION: Stable bibasilar interstitial densities most consistent with scarring. No acute cardiopulmonary abnormality seen. Electronically Signed   By: Marijo Conception, M.D.   On: 08/22/2015 21:52     I have personally reviewed and evaluated these images and lab results as part of my medical decision-making.   EKG Interpretation None      MDM   Final diagnoses:  Hyperglycemia  Hypokalemia  Dehydration    49 year old female presents to the emergency department for further evaluation of polyuria and polydipsia. Patient has been out of her NovoLog since Monday as she states that her insurance will no longer cover this medication. She has been compliant with her Lantus. Anion gap is slightly elevated today. CBG is 500, but has improved with insulin and a small amount of IV fluids. Unable to aggressively hydrate secondary to CHF. Given electrolyte abnormalities and mildly elevated anion gap, have discussed admission with Dr. Blaine Hamper. [facility] agreeable to observation admission for further diabetes management.   Filed Vitals:   08/17/2015 2245 08/10/2015 2300 08/23/2015 2345 08/12/15 0015  BP: 107/71 120/69 124/80 117/64  Pulse: 98 100 100 94  Temp:  TempSrc:      Resp: 25 22 20 19   Height:      Weight:      SpO2: 98% 97% 94% 95%       Antonietta Breach, PA-C 08/12/15 0044  Lacretia Leigh, MD Sep 11, 2015 [phone]

## 2015-08-11 NOTE — Telephone Encounter (Signed)
Patient states she has been out of insulin for 3 days.  Her blood sugar has been 288/300.  She is not feeling good and her eyes are blurry.  States insurance will not pay for novolog.  States that insurance sent request for humalog and has not heard anything back.  Patient needs something done as soon as possible.  Sent patient to [facility].  Do we have samples we can give her in the mean time?

## 2015-08-11 NOTE — Telephone Encounter (Signed)
[facility] - Client [facility] Patient Name: [name] DOB: [birth year] Initial Comment Caller States BS 366, frequent urination, blurry vision, tiredness, Nurse Assessment Nurse: Markus Daft, RN, [place] Date/Time (Eastern Time): 08/10/2015 4:03:30 PM Confirm and document reason for call. If symptomatic, describe symptoms. ---Caller states Blood sugar 366 just now and ate last meal at 2 pm. She has frequent urination with foamy urine, blurry vision, and fatigue. She has taken her Lantus and other dm meds except Novolog can not be filled d/t insurance requiring her to get Humalog instead. She uses [facility] representative. She has tried to reach the doctor, but hasn't heard back from her for the new med. Has the patient traveled out of the country within the last 30 days? ---Not Applicable Does the patient have any new or worsening symptoms? ---Yes Will a triage be completed? ---Yes Related visit to physician within the last 2 weeks? ---No Does the PT have any chronic conditions? (i.e. diabetes, asthma, etc.) ---Yes List chronic conditions. ---CHF - on fluid restriction; IDDM Did the patient indicate they were pregnant? ---No Is this a behavioral health call? ---No Guidelines Guideline Title Affirmed Question Affirmed Notes Diabetes - High Blood Sugar [1] Blood glucose > 240 mg/dl (13 mmol/l) AND [2] rapid breathing Final Disposition User Go to ED Now Markus Daft, RN, [facility] - ED Disagree/Comply: Comply

## 2015-08-11 NOTE — Telephone Encounter (Signed)
Ok to refill zanaflex - done

## 2015-08-12 ENCOUNTER — Ambulatory Visit: Payer: Self-pay | Admitting: Adult Health

## 2015-08-12 ENCOUNTER — Encounter (HOSPITAL_COMMUNITY): Payer: Self-pay | Admitting: Internal Medicine

## 2015-08-12 DIAGNOSIS — I10 Essential (primary) hypertension: Secondary | ICD-10-CM

## 2015-08-12 DIAGNOSIS — M545 Low back pain: Secondary | ICD-10-CM

## 2015-08-12 DIAGNOSIS — R739 Hyperglycemia, unspecified: Secondary | ICD-10-CM

## 2015-08-12 DIAGNOSIS — N179 Acute kidney failure, unspecified: Secondary | ICD-10-CM | POA: Diagnosis not present

## 2015-08-12 DIAGNOSIS — F329 Major depressive disorder, single episode, unspecified: Secondary | ICD-10-CM

## 2015-08-12 DIAGNOSIS — E662 Morbid (severe) obesity with alveolar hypoventilation: Secondary | ICD-10-CM

## 2015-08-12 DIAGNOSIS — F411 Generalized anxiety disorder: Secondary | ICD-10-CM | POA: Diagnosis not present

## 2015-08-12 DIAGNOSIS — Z9981 Dependence on supplemental oxygen: Secondary | ICD-10-CM

## 2015-08-12 DIAGNOSIS — G8929 Other chronic pain: Secondary | ICD-10-CM

## 2015-08-12 DIAGNOSIS — E876 Hypokalemia: Secondary | ICD-10-CM | POA: Diagnosis present

## 2015-08-12 DIAGNOSIS — N183 Chronic kidney disease, stage 3 (moderate): Secondary | ICD-10-CM | POA: Diagnosis not present

## 2015-08-12 DIAGNOSIS — J438 Other emphysema: Secondary | ICD-10-CM

## 2015-08-12 DIAGNOSIS — I272 Other secondary pulmonary hypertension: Secondary | ICD-10-CM

## 2015-08-12 DIAGNOSIS — F332 Major depressive disorder, recurrent severe without psychotic features: Secondary | ICD-10-CM

## 2015-08-12 DIAGNOSIS — J9611 Chronic respiratory failure with hypoxia: Secondary | ICD-10-CM

## 2015-08-12 DIAGNOSIS — D860 Sarcoidosis of lung: Secondary | ICD-10-CM

## 2015-08-12 DIAGNOSIS — I509 Heart failure, unspecified: Secondary | ICD-10-CM

## 2015-08-12 DIAGNOSIS — G4733 Obstructive sleep apnea (adult) (pediatric): Secondary | ICD-10-CM

## 2015-08-12 DIAGNOSIS — E785 Hyperlipidemia, unspecified: Secondary | ICD-10-CM

## 2015-08-12 LAB — CBC
HCT: 42.1 % (ref 36.0–46.0)
HEMATOCRIT: 42.1 % (ref 36.0–46.0)
HEMOGLOBIN: 13.8 g/dL (ref 12.0–15.0)
Hemoglobin: 14.1 g/dL (ref 12.0–15.0)
MCH: 25.6 pg — ABNORMAL LOW (ref 26.0–34.0)
MCH: 26.2 pg (ref 26.0–34.0)
MCHC: 32.8 g/dL (ref 30.0–36.0)
MCHC: 33.5 g/dL (ref 30.0–36.0)
MCV: 78.1 fL (ref 78.0–100.0)
MCV: 78.3 fL (ref 78.0–100.0)
PLATELETS: 202 10*3/uL (ref 150–400)
Platelets: 219 10*3/uL (ref 150–400)
RBC: 5.39 MIL/uL — AB (ref 3.87–5.11)
RBC: 5.38 MIL/uL — AB (ref 3.87–5.11)
RDW: 16 % — ABNORMAL HIGH (ref 11.5–15.5)
RDW: 16.2 % — ABNORMAL HIGH (ref 11.5–15.5)
WBC: 9.8 10*3/uL (ref 4.0–10.5)
WBC: 9.7 10*3/uL (ref 4.0–10.5)

## 2015-08-12 LAB — PROTIME-INR
INR: 1.01 (ref 0.00–1.49)
PROTHROMBIN TIME: 13.5 s (ref 11.6–15.2)

## 2015-08-12 LAB — COMPREHENSIVE METABOLIC PANEL
ALT: 44 U/L (ref 14–54)
AST: 38 U/L (ref 15–41)
Albumin: 3.4 g/dL — ABNORMAL LOW (ref 3.5–5.0)
Alkaline Phosphatase: 115 U/L (ref 38–126)
Anion gap: 14 (ref 5–15)
BUN: 22 mg/dL — ABNORMAL HIGH (ref 6–20)
CHLORIDE: 89 mmol/L — AB (ref 101–111)
CO2: 26 mmol/L (ref 22–32)
Calcium: 8.8 mg/dL — ABNORMAL LOW (ref 8.9–10.3)
Creatinine, Ser: 1.28 mg/dL — ABNORMAL HIGH (ref 0.44–1.00)
GFR, EST AFRICAN AMERICAN: 56 mL/min — AB (ref >60)
GFR, EST NON AFRICAN AMERICAN: 49 mL/min — AB (ref >60)
Glucose, Bld: 380 mg/dL — ABNORMAL HIGH (ref 65–99)
POTASSIUM: 3.3 mmol/L — AB (ref 3.5–5.1)
SODIUM: 129 mmol/L — AB (ref 135–145)
Total Bilirubin: 0.7 mg/dL (ref 0.3–1.2)
Total Protein: 6.5 g/dL (ref 6.5–8.1)

## 2015-08-12 LAB — CREATININE, URINE, RANDOM: Creatinine, Urine: 73.41 mg/dL

## 2015-08-12 LAB — CBG MONITORING, ED: Glucose-Capillary: 369 mg/dL — ABNORMAL HIGH (ref 65–99)

## 2015-08-12 LAB — CREATININE, SERUM
Creatinine, Ser: 1.23 mg/dL — ABNORMAL HIGH (ref 0.44–1.00)
GFR calc non Af Amer: 51 mL/min — ABNORMAL LOW (ref >60)
GFR, EST AFRICAN AMERICAN: 59 mL/min — AB (ref >60)

## 2015-08-12 LAB — BRAIN NATRIURETIC PEPTIDE: B NATRIURETIC PEPTIDE 5: 20.1 pg/mL (ref 0.0–100.0)

## 2015-08-12 LAB — MRSA PCR SCREENING: MRSA by PCR: NEGATIVE

## 2015-08-12 LAB — GLUCOSE, CAPILLARY
GLUCOSE-CAPILLARY: 335 mg/dL — AB (ref 65–99)
GLUCOSE-CAPILLARY: 273 mg/dL — AB (ref 65–99)
GLUCOSE-CAPILLARY: 292 mg/dL — AB (ref 65–99)
GLUCOSE-CAPILLARY: 353 mg/dL — AB (ref 65–99)
Glucose-Capillary: 335 mg/dL — ABNORMAL HIGH (ref 65–99)

## 2015-08-12 LAB — MAGNESIUM: MAGNESIUM: 1.7 mg/dL (ref 1.7–2.4)

## 2015-08-12 MED ORDER — PROMETHAZINE HCL 25 MG PO TABS: 25.0000 mg | ORAL_TABLET | Freq: Four times a day (QID) | ORAL | Status: DC | PRN

## 2015-08-12 MED ORDER — OXYCODONE HCL 5 MG PO TABS
10.0000 mg | ORAL_TABLET | ORAL | Status: DC | PRN
  Administered 2015-08-12 – 2015-08-13 (×6): 10 mg via ORAL
  Filled 2015-08-12 (×6): qty 2

## 2015-08-12 MED ORDER — TIZANIDINE HCL 2 MG PO TABS
2.0000 mg | ORAL_TABLET | Freq: Two times a day (BID) | ORAL | Status: DC | PRN
Start: 2015-08-12 — End: 2015-08-12

## 2015-08-12 MED ORDER — OXYCODONE-ACETAMINOPHEN 7.5-325 MG PO TABS
1.0000 | ORAL_TABLET | Freq: Four times a day (QID) | ORAL | Status: DC | PRN
  Administered 2015-08-12 (×2): 1 via ORAL
  Filled 2015-08-12 (×2): qty 1

## 2015-08-12 MED ORDER — INSULIN ASPART 100 UNIT/ML ~~LOC~~ SOLN
0.0000 [IU] | Freq: Three times a day (TID) | SUBCUTANEOUS | Status: DC
  Administered 2015-08-12: 8 [IU] via SUBCUTANEOUS
  Administered 2015-08-12: 15 [IU] via SUBCUTANEOUS
  Administered 2015-08-12: 6 [IU] via SUBCUTANEOUS
  Administered 2015-08-13: 11 [IU] via SUBCUTANEOUS
  Administered 2015-08-13: 8 [IU] via SUBCUTANEOUS

## 2015-08-12 MED ORDER — SODIUM CHLORIDE 0.9 % IV BOLUS (SEPSIS)
250.0000 mL | Freq: Once | INTRAVENOUS | Status: AC
  Administered 2015-08-12: 250 mL via INTRAVENOUS

## 2015-08-12 MED ORDER — ALBUTEROL SULFATE (2.5 MG/3ML) 0.083% IN NEBU: 2.5000 mg | INHALATION_SOLUTION | Freq: Four times a day (QID) | RESPIRATORY_TRACT | Status: DC | PRN

## 2015-08-12 MED ORDER — HEPARIN SODIUM (PORCINE) 5000 UNIT/ML IJ SOLN
5000.0000 [IU] | Freq: Three times a day (TID) | INTRAMUSCULAR | Status: DC
  Administered 2015-08-12 – 2015-08-13 (×5): 5000 [IU] via SUBCUTANEOUS
  Filled 2015-08-12 (×4): qty 1

## 2015-08-12 MED ORDER — ACETAMINOPHEN 325 MG PO TABS
650.0000 mg | ORAL_TABLET | Freq: Four times a day (QID) | ORAL | Status: DC | PRN
  Filled 2015-08-12: qty 2

## 2015-08-12 MED ORDER — POTASSIUM CHLORIDE CRYS ER 20 MEQ PO TBCR
40.0000 meq | EXTENDED_RELEASE_TABLET | Freq: Two times a day (BID) | ORAL | Status: AC
  Administered 2015-08-12 – 2015-08-13 (×3): 40 meq via ORAL
  Filled 2015-08-12 (×3): qty 2

## 2015-08-12 MED ORDER — VENLAFAXINE HCL ER 37.5 MG PO CP24
37.5000 mg | ORAL_CAPSULE | Freq: Every day | ORAL | Status: DC
  Administered 2015-08-12 – 2015-08-13 (×2): 37.5 mg via ORAL
  Filled 2015-08-12 (×2): qty 1

## 2015-08-12 MED ORDER — INSULIN GLARGINE 100 UNIT/ML ~~LOC~~ SOLN
50.0000 [IU] | Freq: Two times a day (BID) | SUBCUTANEOUS | Status: DC
  Administered 2015-08-12 – 2015-08-13 (×4): 50 [IU] via SUBCUTANEOUS
  Filled 2015-08-12 (×6): qty 0.5

## 2015-08-12 MED ORDER — ACETAMINOPHEN 650 MG RE SUPP: 650.0000 mg | Freq: Four times a day (QID) | RECTAL | Status: DC | PRN

## 2015-08-12 MED ORDER — POTASSIUM CHLORIDE IN NACL 40-0.9 MEQ/L-% IV SOLN
INTRAVENOUS | Status: DC
  Administered 2015-08-12: 75 mL/h via INTRAVENOUS
  Filled 2015-08-12 (×5): qty 1000

## 2015-08-12 MED ORDER — KETOROLAC TROMETHAMINE 30 MG/ML IJ SOLN: 30.0000 mg | Freq: Once | INTRAMUSCULAR | Status: DC

## 2015-08-12 MED ORDER — GABAPENTIN 300 MG PO CAPS
300.0000 mg | ORAL_CAPSULE | Freq: Every day | ORAL | Status: DC
  Administered 2015-08-12 – 2015-08-13 (×2): 300 mg via ORAL
  Filled 2015-08-12 (×2): qty 1

## 2015-08-12 MED ORDER — BUPROPION HCL ER (XL) 150 MG PO TB24: 150.0000 mg | ORAL_TABLET | Freq: Every day | ORAL | Status: DC

## 2015-08-12 MED ORDER — CYCLOBENZAPRINE HCL 10 MG PO TABS: 5.0000 mg | ORAL_TABLET | Freq: Three times a day (TID) | ORAL | Status: DC | PRN

## 2015-08-12 MED ORDER — TIZANIDINE HCL 2 MG PO TABS
2.0000 mg | ORAL_TABLET | Freq: Two times a day (BID) | ORAL | Status: DC | PRN
  Filled 2015-08-12: qty 1

## 2015-08-12 MED ORDER — POTASSIUM CHLORIDE CRYS ER 20 MEQ PO TBCR
40.0000 meq | EXTENDED_RELEASE_TABLET | Freq: Once | ORAL | Status: AC
  Administered 2015-08-12: 40 meq via ORAL
  Filled 2015-08-12: qty 2

## 2015-08-12 MED ORDER — BUDESONIDE-FORMOTEROL FUMARATE 160-4.5 MCG/ACT IN AERO
1.0000 | INHALATION_SPRAY | Freq: Two times a day (BID) | RESPIRATORY_TRACT | Status: DC
  Administered 2015-08-13: 1 via RESPIRATORY_TRACT
  Filled 2015-08-12: qty 6

## 2015-08-12 MED ORDER — FLUTICASONE PROPIONATE 50 MCG/ACT NA SUSP: 1.0000 | Freq: Every day | NASAL | Status: DC | PRN

## 2015-08-12 MED ORDER — INSULIN ASPART 100 UNIT/ML ~~LOC~~ SOLN
10.0000 [IU] | Freq: Once | SUBCUTANEOUS | Status: AC
  Administered 2015-08-12: 10 [IU] via INTRAVENOUS
  Filled 2015-08-12: qty 1

## 2015-08-12 MED ORDER — BIOTIN 2.5 MG PO TABS: 1.0000 | ORAL_TABLET | Freq: Every day | ORAL | Status: DC

## 2015-08-12 MED ORDER — ASPIRIN EC 81 MG PO TBEC
81.0000 mg | DELAYED_RELEASE_TABLET | Freq: Every day | ORAL | Status: DC
  Administered 2015-08-12 – 2015-08-13 (×2): 81 mg via ORAL
  Filled 2015-08-12 (×2): qty 1

## 2015-08-12 MED ORDER — SODIUM CHLORIDE 0.9 % IV SOLN
INTRAVENOUS | Status: DC
  Administered 2015-08-12: 01:00:00 via INTRAVENOUS

## 2015-08-12 MED ORDER — TIOTROPIUM BROMIDE MONOHYDRATE 18 MCG IN CAPS
18.0000 ug | ORAL_CAPSULE | Freq: Every day | RESPIRATORY_TRACT | Status: DC
  Administered 2015-08-13: 18 ug via RESPIRATORY_TRACT
  Filled 2015-08-12: qty 5

## 2015-08-12 MED ORDER — HYDROMORPHONE HCL 1 MG/ML IJ SOLN
1.0000 mg | Freq: Once | INTRAMUSCULAR | Status: AC
  Administered 2015-08-12: 1 mg via INTRAVENOUS
  Filled 2015-08-12: qty 1

## 2015-08-12 MED ORDER — MACITENTAN 10 MG PO TABS
10.0000 mg | ORAL_TABLET | Freq: Every day | ORAL | Status: DC
  Filled 2015-08-12 (×2): qty 1

## 2015-08-12 MED ORDER — ALPRAZOLAM 0.5 MG PO TABS
1.0000 mg | ORAL_TABLET | Freq: Three times a day (TID) | ORAL | Status: DC
  Administered 2015-08-12 – 2015-08-13 (×5): 1 mg via ORAL
  Filled 2015-08-12 (×5): qty 2

## 2015-08-12 MED ORDER — POTASSIUM CHLORIDE CRYS ER 20 MEQ PO TBCR: 40.0000 meq | EXTENDED_RELEASE_TABLET | Freq: Every evening | ORAL | Status: DC

## 2015-08-12 MED ORDER — HYDROXYZINE HCL 25 MG PO TABS
25.0000 mg | ORAL_TABLET | Freq: Four times a day (QID) | ORAL | Status: DC | PRN
  Administered 2015-08-12: 25 mg via ORAL
  Filled 2015-08-12 (×2): qty 1

## 2015-08-12 MED ORDER — ROSUVASTATIN CALCIUM 10 MG PO TABS
10.0000 mg | ORAL_TABLET | Freq: Every day | ORAL | Status: DC
  Administered 2015-08-12 – 2015-08-13 (×2): 10 mg via ORAL
  Filled 2015-08-12 (×2): qty 1

## 2015-08-12 MED ORDER — OMEGA-3-ACID ETHYL ESTERS 1 G PO CAPS
1000.0000 mg | ORAL_CAPSULE | Freq: Every day | ORAL | Status: DC
  Administered 2015-08-12 – 2015-08-13 (×2): 1000 mg via ORAL
  Filled 2015-08-12 (×2): qty 1

## 2015-08-12 MED ORDER — COLCHICINE 0.6 MG PO TABS
0.6000 mg | ORAL_TABLET | Freq: Two times a day (BID) | ORAL | Status: DC
  Administered 2015-08-12 – 2015-08-13 (×4): 0.6 mg via ORAL
  Filled 2015-08-12 (×5): qty 1

## 2015-08-12 MED ORDER — HYDROMORPHONE HCL 1 MG/ML IJ SOLN: 0.5000 mg | Freq: Once | INTRAMUSCULAR | Status: DC

## 2015-08-12 MED ORDER — TADALAFIL (PAH) 20 MG PO TABS
2.0000 | ORAL_TABLET | Freq: Every day | ORAL | Status: DC
  Administered 2015-08-12 – 2015-08-13 (×2): 40 mg via ORAL
  Filled 2015-08-12 (×3): qty 2

## 2015-08-12 MED ORDER — SODIUM CHLORIDE 0.9 % IJ SOLN
3.0000 mL | Freq: Two times a day (BID) | INTRAMUSCULAR | Status: DC
  Administered 2015-08-12 – 2015-08-13 (×4): 3 mL via INTRAVENOUS

## 2015-08-12 MED ORDER — ALBUTEROL SULFATE HFA 108 (90 BASE) MCG/ACT IN AERS: 2.0000 | INHALATION_SPRAY | Freq: Four times a day (QID) | RESPIRATORY_TRACT | Status: DC | PRN

## 2015-08-12 MED ORDER — LORATADINE 10 MG PO TABS
10.0000 mg | ORAL_TABLET | Freq: Every day | ORAL | Status: DC
  Administered 2015-08-12 – 2015-08-13 (×2): 10 mg via ORAL
  Filled 2015-08-12 (×2): qty 1

## 2015-08-12 MED ORDER — PANTOPRAZOLE SODIUM 40 MG PO TBEC
40.0000 mg | DELAYED_RELEASE_TABLET | Freq: Every day | ORAL | Status: DC
  Administered 2015-08-12 – 2015-08-13 (×2): 40 mg via ORAL
  Filled 2015-08-12 (×2): qty 1

## 2015-08-12 NOTE — Progress Notes (Signed)
Patient stated she takes Spiriva, Symbicort & Albuterol at home.  MD notified.

## 2015-08-12 NOTE — Consult Note (Signed)
CARDIOLOGY CONSULT NOTE   [name] MRN: [id number] DOB/AGE: 48-Jun-1967 48 y.o.  Admit date: 08/10/2015  Primary Physician   Gwendolyn Grant, MD Primary Cardiologist   Dr. Aundra Dubin Reason for Consultation   CHF meds managdment  HPI: [name] is a 49 y.o. with a PMH of OHS/OSA on Cpap and home oxygen, chronic diastolic CHF with prominent RV dysfunction, pulmonary hypertension, Fibromyalgia, Pulmonary sarcoidosis, HTN, dyslipidemia, and COPD. She was hospitalized for acute on chronic diastolic CHF had RHC in [id number] (see below) with plans to place her on ERA (macitentan) after left heart filling pressure optimized as there is some evidence for ERA use in sarcoidosis-related PAH who admitted 08/12/2015 with hyperosmolar nonketotic hyperglycemia.  [place] 03/17/15 with RA mean 16; PA 70/32, mean 47; PCWP mean 20; Cardiac Index (Thermo) 2.07; PVR 5.5 WU. Echo (3/16) with EF 65-70%, D-shaped interventricular septum, moderately dilated RV with severely reduced systolic function, PASP [id number] mmHg.  Repeat RHC (9/16) with mean RA 5, PA 47/27 mean 36, mean PCWP 17, CI 2.18/PVR 3.6 Fick, CI 3.66/PVR 3.0 thermo.   She was on macitentan and tadalafil 20 mg daily when seen last by Dr. Aundra Dubin 06/22/2015. She did well with 6 minute walk and her tadalafil increased to 40mg  daily. Continued torsemide 100 mg po bid and continue metolazone M/W/Sat (three times a week). She will take an extra KCl 40 on metolazone days. The patient has f/u appointment 08/16/15.  The patient came to the ED last night with a complaint of uncontrolled blood sugar, frequent urination, generalized fatigue and blurred vision. Her insurance had denied coverage for Novolog. He also admits to having nausea but no vomiting or abdominal pain. She also had ntermittent diarrhea. Worsening shortness of breath recently. Also tells that she had a left-sided chest pain intermittently that feels like a heartburn. She has been on  chronic 4L O2 via [place] since September. No worsening leg swelling. She states that she is compliant with her medication. She states that her weight is usually in 268-274lb since seen by Dr. Aundra Dubin.  In ED, patient was found to have blood sugar 444, AG=18, bicarbonate 27, potassium 2.9, negative urinalysis for UTI and ketone, negative troponin, WBC 9.8, temperature normal, tachypnea. Chest x-ray is consistent with chronic scarring changes.  The patient was admitted with hyperosmolar nonketotic hyperglycemia, blood pressure soft in the 90s- held Lasix and Zaroxolyn. Her CBG, sodium and renal function improving with IV fluids and holding diuretics. Cardiology is consulted for further management of her diuretics.  Past Medical History  Diagnosis Date  . Hypertension   . Hyperlipidemia   . Chronic headache   . Fibromyalgia     daily narcotics  . Anxiety     hx chronic BZ use, stopped 07/2010  . Pulmonary sarcoidosis ([place])     unimpressive CT chest 2011  . Colonic polyp   . GERD (gastroesophageal reflux disease)   . ALLERGIC RHINITIS   . Asthma   . CHF (congestive heart failure) (HCC)     Diastolic with fluid overload, May, 2012, LVEF 60%  . Morbid obesity ([place])   . Depression   . Panic attacks   . Diabetes mellitus   . COPD (chronic obstructive pulmonary disease) (HCC)     on home O2, moderate airflow obstruction, suspect d/t emphysema  . Obesity   . Elevated LFTs 09/2011  . Ovarian cyst   . Chronic back pain   . Sleep apnea      CPAP  .  Fatty liver   . Esophagitis   . Gastritis   . Internal hemorrhoids   . Adenomatous colon polyp 12/03/09    [facility] in [place], [place]  . CKD (chronic kidney disease), stage III      Past Surgical History  Procedure Laterality Date  . Polypectomy  2011  . Lumbar microdiscectomy  07/06/2011    R L4-5, stern  . Back surgery    . Carpal tunnel release    . Steroid spinal injections    . Hysteroscopy w/d&c N/A 03/25/2013    Procedure:  DILATATION AND CURETTAGE /HYSTEROSCOPY;  Surgeon: [name], MD;  Location: [facility];  Service: Gynecology;  Laterality: N/A;  . Dilation and curettage of uterus    . Cardiac catheterization N/A 03/17/2015    Procedure: Right Heart Cath;  Surgeon: Larey Dresser, MD;  Location: [facility];  Service: Cardiovascular;  Laterality: N/A;  . Cardiac catheterization N/A 06/01/2015    Procedure: Right Heart Cath;  Surgeon: Larey Dresser, MD;  Location: [facility];  Service: Cardiovascular;  Laterality: N/A;    Allergies  Allergen Reactions  . Simvastatin Other (See Comments)    Severe leg pain and burning  . Sulfamethoxazole-Trimethoprim Nausea And Vomiting    Causes Projectile Vomiting  . Zolpidem Tartrate Other (See Comments)    Chest pain  . Azithromycin Other (See Comments)    Resistant to med  . Ceftin [Cefuroxime] Swelling and Other (See Comments)    MOUTH SWELLING  . Doxycycline Other (See Comments)    Resistant to med  . Latex Itching and Rash    Also burning sensations  . Metformin And Related Diarrhea    Severe diarrhea  . Metronidazole Nausea And Vomiting  . Morphine-Naltrexone Palpitations  . Ciprofloxacin Nausea Only  . Tramadol Itching and Nausea Only    I have reviewed the patient's current medications . ALPRAZolam  1 mg Oral TID  . aspirin EC  81 mg Oral Daily  . colchicine  0.6 mg Oral BID  . gabapentin  300 mg Oral Daily  . heparin  5,000 Units Subcutaneous 3 times per day  . insulin aspart  0-15 Units Subcutaneous TID WC  . insulin glargine  50 Units Subcutaneous BID  . loratadine  10 mg Oral Daily  . Macitentan  10 mg Oral Daily  . omega-3 acid ethyl esters  1,000 mg Oral Daily  . pantoprazole  40 mg Oral Daily  . potassium chloride SA  40 mEq Oral BID  . rosuvastatin  10 mg Oral Daily  . sodium chloride  3 mL Intravenous Q12H  . Tadalafil (PAH)  2 tablet Oral Daily  . venlafaxine XR  37.5 mg Oral Daily   . 0.9 % NaCl with KCl 40 mEq /  L 75 mL/hr (08/12/15 1143)   acetaminophen **OR** acetaminophen, albuterol, fluticasone, hydrOXYzine, oxyCODONE-acetaminophen, promethazine, tiZANidine  Prior to Admission medications   Medication Sig Start Date End Date Taking? Authorizing Provider  albuterol (PROAIR HFA) 108 (90 BASE) MCG/ACT inhaler Inhale 2 puffs into the lungs every 6 (six) hours as needed for wheezing or shortness of breath. 05/01/15  Yes Rowe Clack, MD  ALPRAZolam Duanne Moron) 1 MG tablet Take 1 tablet (1 mg total) by mouth 3 (three) times daily. 06/10/15  Yes Rowe Clack, MD  aspirin EC 81 MG tablet Take 81 mg by mouth daily.   Yes Historical Provider, MD  BIOTIN PO Take 1 capsule by mouth daily.  Yes Historical Provider, MD  buPROPion (WELLBUTRIN XL) 150 MG 24 hr tablet Take 1 tablet (150 mg total) by mouth daily. 07/21/15  Yes Rowe Clack, MD  cetirizine (ZYRTEC) 1 MG/ML syrup Take 5 mLs (5 mg total) by mouth daily. 08/09/15  Yes Rowe Clack, MD  colchicine 0.6 MG tablet Take 1 tablet (0.6 mg total) by mouth 2 (two) times daily. 06/01/15  Yes Allie Bossier, MD  esomeprazole (NEXIUM) 40 MG capsule Take 1 capsule (40 mg total) by mouth 2 (two) times daily. 07/14/15  Yes Rowe Clack, MD  fluticasone (FLONASE) 50 MCG/ACT nasal spray Place 1 spray into both nostrils daily as needed for allergies or rhinitis.   Yes Historical Provider, MD  gabapentin (NEURONTIN) 300 MG capsule Take 1 capsule (300 mg total) by mouth daily. 07/06/15  Yes Rowe Clack, MD  glimepiride (AMARYL) 1 MG tablet Take 1 mg by mouth daily with breakfast.   Yes Historical Provider, MD  insulin aspart (NOVOLOG FLEXPEN) 100 UNIT/ML FlexPen Inject 15 Units into the skin 3 (three) times daily with meals. Per sliding scale   Yes Historical Provider, MD  Insulin Glargine (LANTUS SOLOSTAR) 100 UNIT/ML Solostar Pen Inject 50 Units into the skin 2 (two) times daily. 07/19/15  Yes Rowe Clack, MD  Liraglutide (VICTOZA) 18  MG/3ML SOPN Inject 3 mLs into the skin daily.   Yes Historical Provider, MD  Macitentan 10 MG TABS Take 10 mg by mouth daily. 04/11/15  Yes Barton Dubois, MD  metolazone (ZAROXOLYN) 2.5 MG tablet Take 1 tablet (2.5 mg total) by mouth 3 (three) times a week. Patient taking differently: Take 2.5 mg by mouth 3 (three) times a week. Three times a week 06/10/15  Yes Rowe Clack, MD  omega-3 fish oil (MAXEPA) 1000 MG CAPS capsule Take 1 capsule by mouth daily.   Yes Historical Provider, MD  oxyCODONE-acetaminophen (PERCOCET) 7.5-325 MG tablet Take 1 tablet by mouth 4 (four) times daily as needed for moderate pain.   Yes Historical Provider, MD  potassium chloride SA (K-DUR,KLOR-CON) 20 MEQ tablet Take 2 tablets (40 mEq total) by mouth every evening. 06/03/15  Yes Allie Bossier, MD  promethazine (PHENERGAN) 25 MG tablet TAKE 1 TABLET BY MOUTH EVERY 6 HOURS AS NEEDED FOR NAUSEA AND VOMITING 06/10/15  Yes Rowe Clack, MD  rosuvastatin (CRESTOR) 10 MG tablet Take 1 tablet (10 mg total) by mouth daily. 06/22/15  Yes Rowe Clack, MD  Tadalafil, PAH, (ADCIRCA) 20 MG TABS Take 2 tablets (40 mg total) by mouth daily. 06/22/15  Yes Larey Dresser, MD  torsemide (DEMADEX) 100 MG tablet Take 1 tablet (100 mg total) by mouth 2 (two) times daily. 05/17/15  Yes Rowe Clack, MD  venlafaxine XR (EFFEXOR XR) 37.5 MG 24 hr capsule Take 1 capsule (37.5 mg total) by mouth daily. 06/10/15  Yes Rowe Clack, MD  cyclobenzaprine (FLEXERIL) 5 MG tablet Take 1 tablet (5 mg total) by mouth 3 (three) times daily as needed for muscle spasms. Patient not taking: Reported on 08/04/2015 08/09/15   Janne Napoleon, NP  insulin aspart (NOVOLOG FLEXPEN) 100 UNIT/ML FlexPen Sliding Scale: 15 units = 250 - 300, 20 units = 301 and above Patient not taking: Reported on 08/02/2015 08/09/15   Rowe Clack, MD  tiZANidine (ZANAFLEX) 2 MG tablet Take 1 tablet (2 mg total) by mouth 2 (two) times daily as needed for muscle  spasms. 08/12/15   Rowe Clack, MD  Social History   Social History  . Marital Status: Legally Separated    Spouse Name: N/A  . Number of Children: N/A  . Years of Education: N/A   Occupational History  . Not on file.   Social History Main Topics  . Smoking status: Former Smoker -- 0.25 packs/day for 29 years    Types: Cigarettes    Quit date: 11/24/2014  . Smokeless tobacco: Never Used  . Alcohol Use: No     Comment: occasionally  . Drug Use: Yes    Special: Cocaine, Marijuana     Comment: remote cocaine; current marijuana  . Sexual Activity: Not on file   Other Topics Concern  . Not on file   Social History Narrative   Pt is single. No children. On disability    Family Status  Relation Status Death Age  . Mother Deceased 61    dementia  . Father Deceased 96    of stomach cancer  . Brother Deceased   . Brother Alive   . Sister Alive   . Sister Alive   . Sister Alive    Family History  Problem Relation Age of Onset  . Hypertension Mother   . Heart disease Mother   . Emphysema Father   . Hypertension Father   . Stomach cancer Father   . Heart disease Father   . Allergies Brother   . Stomach cancer Brother   . Congestive Heart Failure Brother   . Diabetes type II Brother   . Heart disease Sister   . Diabetes type II Sister   . Heart attack Sister   . Stroke Sister   . Hypertension Sister   . Heart failure Sister     diastolic  . Hypertension Sister        ROS:  Full 14 point review of systems complete and found to be negative unless listed above.  Physical Exam: Blood pressure 119/67, pulse 96, temperature 97.8 F (36.6 C), temperature source Oral, resp. rate 18, height 5\' 8"  (1.727 m), weight 261 lb 0.4 oz (118.4 kg), last menstrual period 10/16/2012, SpO2 98 %.  General: obese  female in no acute distress with oxygen in place Head: Eyes PERRLA, No xanthomas. Normocephalic and atraumatic, oropharynx without edema or exudate.  Lungs:  Resp regular and unlabored, CTA. Heart: RRR no s3, s4, or murmurs..   Neck: No carotid bruits. No lymphadenopathy.  JVD unable to assess due to girth.  Abdomen: Bowel sounds present, abdomen soft and non-tender without masses or hernias noted. Msk:  No spine or cva tenderness. No weakness, no joint deformities or effusions. Extremities: No clubbing, cyanosis or edema. DP/PT/Radials 2+ and equal bilaterally. Neuro: Alert and oriented X 3. No focal deficits noted. Psych:  Good affect, responds appropriately Skin: No rashes or lesions noted.  Labs:   Lab Results  Component Value Date   WBC 9.7 08/12/2015   HGB 14.1 08/12/2015   HCT 42.1 08/12/2015   MCV 78.3 08/12/2015   PLT 202 08/12/2015    Recent Labs  08/12/15 0144  INR 1.01    Recent Labs Lab 08/12/15 0421  NA 129*  K 3.3*  CL 89*  CO2 26  BUN 22*  CREATININE 1.28*  CALCIUM 8.8*  PROT 6.5  BILITOT 0.7  ALKPHOS 115  ALT 44  AST 38  GLUCOSE 380*  ALBUMIN 3.4*   MAGNESIUM  Date Value Ref Range Status  08/12/2015 1.7 1.7 - 2.4 mg/dL Final   No results for  input(s): CKTOTAL, CKMB, TROPONINI in the last 72 hours.  Recent Labs  07/31/2015 2110  TROPIPOC 0.00   Lab Results  Component Value Date   CHOL 297* 06/10/2015   HDL 52.10 06/10/2015   LDLCALC 178* 04/06/2014   TRIG 308.0* 06/10/2015    Echo: 12/21/2014 LV EF: 65% -  70%  ------------------------------------------------------------------- Indications:   Chest pain 786.51.  ------------------------------------------------------------------- History:  PMH:  Dyspnea. Chronic obstructive pulmonary disease. Risk factors: Current tobacco use. Hypertension. Diabetes mellitus. Dyslipidemia.  ------------------------------------------------------------------- Study Conclusions  - Left ventricle: The cavity size was normal. Wall thickness was increased in a pattern of moderate LVH. Systolic function was vigorous. The estimated ejection  fraction was in the range of 65% to 70%. Doppler parameters are consistent with abnormal left ventricular relaxation (grade 1 diastolic dysfunction). Doppler parameters are consistent with high ventricular filling pressure. - Ventricular septum: The contour showed diastolic flattening and systolic flattening. - Mitral valve: There was mild regurgitation. - Right ventricle: The cavity size was moderately dilated. Systolic function was severely reduced. - Right atrium: The atrium was mildly dilated. - Atrial septum: The septum bowed from right to left, consistent with increased right atrial pressure. - Pulmonary arteries: Systolic pressure was severely increased. PA peak pressure: 106 mm Hg (S).  Impressions:  - Normal LV function; grade 1 diastolic dysfunction; RAE/RVE; severely reduced RV function; severely elevated pulmonary pressure.  ECG:  Vent. rate 98 BPM PR interval 159 ms QRS duration 85 ms QT/QTc 368/470 ms P-R-T axes 14 61 84  Radiology:  Dg Chest 2 View  08/04/2015  CLINICAL DATA:  Shortness of breath. EXAM: CHEST  2 VIEW COMPARISON:  May 30, 2015. FINDINGS: Stable cardiomediastinal silhouette. No pneumothorax or pleural effusion is noted. Stable bibasilar interstitial densities are noted most consistent with scarring. No acute pulmonary disease is noted. Bony thorax is unremarkable. IMPRESSION: Stable bibasilar interstitial densities most consistent with scarring. No acute cardiopulmonary abnormality seen. Electronically Signed   By: Marijo Conception, M.D.   On: 08/24/2015 21:52    ASSESSMENT AND PLAN:    Principal Problem:   Acute renal failure superimposed on stage 3 chronic kidney disease (HCC) Active Problems:   HLD (hyperlipidemia)   Morbid obesity (HCC)   Anxiety state   Essential hypertension   Chronic respiratory failure (HCC)   GERD   Obstructive sleep apnea   Obesity hypoventilation syndrome ([place])   Diabetes type 2, uncontrolled  (HCC)   Depression   COPD (chronic obstructive pulmonary disease) with emphysema ([place])   Non-compliant behavior   Chronic low back pain   Pulmonary HTN ([place])   Right-sided heart failure ([place])   On home oxygen therapy   MDD (major depressive disorder), recurrent severe, without psychosis ([place])   Pulmonary sarcoidosis (HCC)   Hypokalemia  Plan:  OTTAVIA BEAGLES is a 49 y.o. with a PMH of OHS/OSA on Cpap and home oxygen, chronic hypoxic respiratory failure chronic diastolic CHF with prominent RV dysfunction, pulmonary hypertension, Fibromyalgia, Pulmonary sarcoidosis, HTN, dyslipidemia, and COPD admitted with hyperosmolar nonketotic hyperglycemia in setting of dehydration - induced by diuretics. She was taking torsemide 100 mg po bid and metolazone M/W/Sat (three times a week). She will take an extra KCl 40 on metolazone days.  Her blood pressure soft in the 90s- held Lasix and Zaroxolyn. Her CBG, Lytes and renal function improving with IV fluids. Continue  macitentan and tadalafil. BP improved. BNP normal. Troponin and EKG unremarkable. he patient has appointment with Dr. Aundra Dubin 08/16/15. Her baseline  weight is 268-274lb for the past 4-6 weeks. Today her weight is 261lb. She appears euvolemic on exam. Hold diuretics today, may be restart tomorrow.   SignedLeanor Kail, PA 08/12/2015, 2:02 PM Pager [phone]  Co-Sign MD  Letha Cape is a 49 y.o. 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 She also has OSA on CPAP, CKD stage 2 and fibromyalgia. She is on torsemide 100 mg po bid and metolazone M/W/Sat (three times a week).Her baseline weight is 168 - 174 lbs.  She was admitted with hyperosmolar nonketotic hyperglycemia as she couldn't afford insulin. She is currently hypotensive, dehydrated, with weight 161 lbs. Crea baseline 1.2, on admission 1.6,  with hydration back to 1.2. I would hold off diuretics today and tomorrow, we will reassess in the am.  We might hold off diuretics for 2-3 more days.  She has appointment with Dr Aundra Dubin in [place] [facility] on Monday 08/15/15.  Dorothy Spark 08/12/2015

## 2015-08-12 NOTE — Care Management Note (Signed)
Case Management Note  Patient Details  Name: MERIAN WROE MRN: [id number] Date of Birth: 1965-10-22  Subjective/Objective:               CM following for progression and d/c planning.     Action/Plan: 08/12/2015 Met with pt re need for Novolog Insulin, as pt has Medicare prescription coverage and Medicaid, it was unclear how she was unable to get her medications. This pt reports that her [facility] , [facility] stated that her Medicare prescription coverage would not cover Novolog therefore her Medicaid would not cover when pt questioned this. This CM provided the pt with a list of the medications covered by [place] Medicaid , which includes Novolog. The pt is preparing to speak with her pharmacy or change pharmacies if she is unable to resolve this with [facility].   Expected Discharge Date:    08/12/2015              Expected Discharge Plan:  Home/Self Care  In-House Referral:  NA  Discharge planning Services  CM Consult, Medication Assistance  Post Acute Care Choice:  NA Choice offered to:  NA  DME Arranged:   NA DME Agency:   NA  HH Arranged:   NA HH Agency:   NA  Status of Service:  Completed, signed off  Medicare Important Message Given:    Date Medicare IM Given:    Medicare IM give by:    Date Additional Medicare IM Given:    Additional Medicare Important Message give by:     If discussed at [place] of Stay Meetings, dates discussed:    Additional Comments:  Adron Bene, RN 08/12/2015, 11:57 AM

## 2015-08-12 NOTE — Progress Notes (Signed)
New Admission Note: Pt admitted to the floor from [facility] ED  Arrival Method: via Stretcher Mental Orientation: alert and oriented x 4 Telemetry: Placed per order Assessment: Completed Skin: Intact IV: R post fa Pain: chronic back pain Tubes: None Safety Measures: Safety Fall Prevention Plan has been discussed and pt refuses bed alarm Admission: To be completed 6 [place] Orientation: Patient has been orientated to the room, unit and staff.  Family: None at bedside  Orders have been reviewed and implemented. Will continue to monitor the patient. Call light has been placed within reach and bed alarm has been activated.   Mady Gemma, RN-BC Phone: [phone]

## 2015-08-12 NOTE — Progress Notes (Signed)

## 2015-08-12 NOTE — Evaluation (Signed)
Occupational Therapy Evaluation Patient Details Name: [name] MRN: [id number] DOB: [birth year] Today's Date: 08/12/2015    History of Present Illness [name] is a 49 y.o. admitted for uncontrolled blood sugar, frequent urination, blurred vision, generalized weakness, worsening SOB, and nausea. PMH of OHS/OSA on Cpap and home oxygen, chronic diastolic CHF with prominent RV dysfunction, pulmonary hypertension, Fibromyalgia, Pulmonary sarcoidosis, HTN, dyslipidemia, and COPD.   Clinical Impression   Pt was assisted by her aide for bathing, dressing and IADL prior to admission.  Pt presents with fatigue, generalized weakness and desire to sleep. Supervised for standing and ambulation with RW within room/bathroom.  Pt likely to progress as medical issues resolve and she is rested.  No further acute OT needs.  Recommend pt perform ADL with nursing staff.    Follow Up Recommendations  No OT follow up    Equipment Recommendations  None recommended by OT    Recommendations for Other Services       Precautions / Restrictions Precautions Precautions: None Precaution Comments: monitor O2 Restrictions Weight Bearing Restrictions: No      Mobility Bed Mobility Overal bed mobility: Modified Independent                Transfers Overall transfer level: Modified independent                    Balance                                            ADL Overall ADL's : Needs assistance/impaired Eating/Feeding: Independent;Sitting   Grooming: Wash/dry hands;Standing;Supervision/safety   Upper Body Bathing: Set up;Sitting   Lower Body Bathing: Minimal assistance;Sit to/from stand   Upper Body Dressing : Set up;Sitting   Lower Body Dressing: Minimal assistance;Sit to/from stand Lower Body Dressing Details (indicate cue type and reason): pt avoids socks, wears slip on shoes Toilet Transfer: Supervision/safety;Ambulation;RW;Regular Toilet   Toileting- Water quality scientist and Hygiene: Supervision/safety;Sit to/from stand       Functional mobility during ADLs: Supervision/safety;Rolling walker General ADL Comments: drowsiness, fatigue likely contributing to unsteadiness     Vision     Perception     Praxis      Pertinent Vitals/Pain Pain Assessment: Faces Faces Pain Scale: Hurts little more Pain Location: back, knees Pain Descriptors / Indicators: Aching Pain Intervention(s): Repositioned;Monitored during session;Limited activity within patient's tolerance     Hand Dominance Right   Extremity/Trunk Assessment Upper Extremity Assessment Upper Extremity Assessment: Generalized weakness;Overall [facility] for tasks assessed   Lower Extremity Assessment Lower Extremity Assessment: Defer to PT evaluation   Cervical / Trunk Assessment Cervical / Trunk Assessment: Other exceptions (hx of chronic back pain)   Communication Communication Communication: No difficulties   Cognition Arousal/Alertness: Awake/alert Behavior During Therapy: WFL for tasks assessed/performed Overall Cognitive Status: Within Functional Limits for tasks assessed                     General Comments       Exercises       Shoulder Instructions      Home Living Family/patient expects to be discharged to:: Private residence Living Arrangements: Alone Available Help at Discharge: Personal care attendant;Available PRN/intermittently Type of Home: Apartment Home Access: Level entry     Home Layout: One level     Bathroom Shower/Tub: Tub/shower unit;Curtain Shower/tub characteristics: Architectural technologist:  Standard     Home Equipment: Walker - 2 wheels;Bedside commode;Tub bench;Adaptive equipment Adaptive Equipment: Reacher Additional Comments: Pt has a PCA 3 hours/day 7 days/week.       Prior Functioning/Environment Level of Independence: Needs assistance    ADL's / Homemaking Assistance Needed: Aide assist with  bathing, dressing, meal prep, housekeeping.   Comments: states she is on 4-6 supplemental O2 at home.    OT Diagnosis: Generalized weakness;Acute pain   OT Problem List:     OT Treatment/Interventions:      OT Goals(Current goals can be found in the care plan section) Acute Rehab OT Goals Patient Stated Goal: go to sleep  OT Frequency:     Barriers to D/C:            Co-evaluation              End of Session Equipment Utilized During Treatment: Rolling walker;Oxygen  Activity Tolerance: Patient limited by fatigue Patient left: in bed;with call bell/phone within reach   Time: 1439-1449 OT Time Calculation (min): 10 min Charges:    G-Codes: OT G-codes **NOT FOR INPATIENT CLASS** Functional Assessment Tool Used: clinical judgement Functional Limitation: Self care Self Care Current Status [id number]): At least 1 percent but less than 20 percent impaired, limited or restricted Self Care Discharge Status [phone]): At least 1 percent but less than 20 percent impaired, limited or restricted  Malka So 08/12/2015, 3:35 PM  [phone]

## 2015-08-12 NOTE — Progress Notes (Signed)
Patient seen and examined  Admitted yesterday for elevated blood sugar, generalized weakness, blurry vision, hyperosmolar nonketotic hyperglycemia Blood pressure soft in the 90s, held Lasix and Zaroxolyn CBG is improving, sodium improving, renal function improving Potassium still low likely secondary to diuretic use  Plan Continue IV fluids, expect sodium to correct after correction of CBGs Hold diuretics PT/OT evaluation

## 2015-08-12 NOTE — Progress Notes (Signed)
Inpatient Diabetes Program Recommendations  AACE/ADA: New Consensus Statement on Inpatient Glycemic Control (2015)  Target Ranges:  Prepandial:   less than 140 mg/dL      Peak postprandial:   less than 180 mg/dL (1-2 hours)      Critically ill patients:  140 - 180 mg/dL    Results for [name], [name] (MRN [id number]) as of 08/12/2015 12:52  Ref. Range 08/17/2015 17:31 08/01/2015 22:04 08/12/2015 00:05 08/12/2015 02:42 08/12/2015 07:28 08/12/2015 11:36  Glucose-Capillary Latest Ref Range: 65-99 mg/dL 459 (H) 500 (H) 369 (H) 335 (H) 273 (H) 292 (H)    Admit with: Hyperglycemia  History: DM  Home DM Meds: Lantus 50 units bid       Novolog 10 units tidwc + Novolog SSI       Amaryl 1 mg daily       Victoza daily        Current Insulin Orders: Lantus 50 units bid      Novolog Moderate SSI (0-15 units) TID AC     MD- Please consider starting patient's home dose of Novolog Meal Coverage-  Novolog 10 units tid with meals     -Spoke with patient today.  Patient told me that [place] stated that Medicaid would not cover her Novolog insulin.  As a result, patient went several days without taking her Novolog.  Glucose levels became very high and patient eventually admitted to hospital with glucose of 459 mg/dl.    -Note that care management spoke with patient about her Medicaid.  Per Care manager, patient should be able to get her Novolog filled without incident.  -Asked patient if she had any questions about her DM care regimen at home.  Patient stated she did not.  Patient did tell me that she will be seeing a new PCP (Dr. Jeanie Cooks) on Dec 5th.  Per patient, her PCP (Dr. Otelia Limes) will be moving into management and will no longer be seeing patients.  Patient stated she would like to go see Dr. Philemon Kingdom with [facility] for her DM management.  I told patient she should call Dr. Arman Filter office andmake an appointment, however, patient stated she needs a referral  to see Dr. Cruzita Lederer.  Encouraged patient to ask her new PCP for a referral to see Dr. Cruzita Lederer when she sees him on 12/05.    --Will follow patient during hospitalization--  Wyn Quaker RN, MSN, CDE Diabetes Coordinator Inpatient Glycemic Control Team Team Pager: [phone] (8a-5p)

## 2015-08-12 NOTE — Telephone Encounter (Signed)
Pt was admitted to ED per team health

## 2015-08-12 NOTE — H&P (Signed)
[facility] History and Physical  [name] [id number] DOB: 1965/09/30 DOA: 08/15/2015  Referring physician: ED physician PCP: Gwendolyn Grant, MD  Specialists:   Chief Complaint: Elevated blood sugar, generalized weakness, blurry vision  HPI: [name] is a 49 y.o. female with PMH of hypertension, dyslipidemia, fibromyalgia, right heart failure, CHF (LVEF 65-70%), IDDM, pulmonary hypertension, COPD on chronic 4L via [place], sleep apnea, hyperlipidemia, pulmonary sarcoidosis, panic attack, obesity, gastritis, CKD-III, who presents with elevated blood sugar, generalized weakness and blurry vision.  She states that she had her insulin regimen refilled on Monday, but her insurance would not cover her NovoLog. She states that she was able to get her Lantus and has been taking her daily morning and evening doses. She reports that her blood sugar levels has been increasing over the last few days.  She has been urinating more frequently, especially during the night, but no dysuria or burning on urination. She also has generalized weakness and blurry vision. She feels nauseated, but no vomiting or abdominal pain. She had mild diarrhea on Monday, which has resolved. No diarrhea today. She also reports worsening SOB over the last day or two. She has been on chronic 4L O2 via [place] since September. Patient denies recent weight gain, says she has lost 30 pounds. No worsening leg swelling. Currently patient does not have fever, chills, chest pain, unilateral weakness. She reports calling her PCP about changes in her insulin regimen, but has yet to hear back from her doctor.   In ED, patient was found to have blood sugar 444, AG=18, bicarbonate 27, potassium 2.9, negative urinalysis for UTI and ketone, negative troponin, WBC 9.8, temperature normal, tachypnea. Chest x-ray is consistent with chronic scarring changes. Patient i a 49 year old lady with a history of these will s admitted to inpatient  for further evaluation and treatment.  Where does patient live?   At home    Can patient participate in ADLs?  Some   Review of Systems:   General: no fevers, chills, no changes in body weight, has poor appetite, has fatigue HEENT: no blurry vision, hearing changes or sore throat Pulm: has dyspnea, coughing, no wheezing CV: no chest pain, palpitations Abd: has nausea, no vomiting, abdominal pain, diarrhea, constipation GU: no dysuria, burning on urination, has increased urinary frequency, no hematuria  Ext: no leg edema Neuro: no unilateral weakness, numbness, or tingling, no vision change or hearing loss Skin: no rash MSK: No muscle spasm, no deformity, no limitation of range of movement in spin Heme: No easy bruising.  Travel history: No recent long distant travel.  Allergy:  Allergies  Allergen Reactions  . Simvastatin Other (See Comments)    Severe leg pain and burning  . Sulfamethoxazole-Trimethoprim Nausea And Vomiting    Causes Projectile Vomiting  . Zolpidem Tartrate Other (See Comments)    Chest pain  . Azithromycin Other (See Comments)    Resistant to med  . Ceftin [Cefuroxime] Swelling and Other (See Comments)    MOUTH SWELLING  . Doxycycline Other (See Comments)    Resistant to med  . Latex Itching and Rash    Also burning sensations  . Metformin And Related Diarrhea    Severe diarrhea  . Metronidazole Nausea And Vomiting  . Morphine-Naltrexone Palpitations  . Ciprofloxacin Nausea Only  . Tramadol Itching and Nausea Only    Past Medical History  Diagnosis Date  . Hypertension   . Hyperlipidemia   . Chronic headache   . Fibromyalgia  daily narcotics  . Anxiety     hx chronic BZ use, stopped 07/2010  . Pulmonary sarcoidosis ([place])     unimpressive CT chest 2011  . Colonic polyp   . GERD (gastroesophageal reflux disease)   . ALLERGIC RHINITIS   . Asthma   . CHF (congestive heart failure) (HCC)     Diastolic with fluid overload, May, 2012, LVEF  60%  . Morbid obesity ([place])   . Depression   . Panic attacks   . Diabetes mellitus   . COPD (chronic obstructive pulmonary disease) (HCC)     on home O2, moderate airflow obstruction, suspect d/t emphysema  . Obesity   . Elevated LFTs 09/2011  . Ovarian cyst   . Chronic back pain   . Sleep apnea      CPAP  . Fatty liver   . Esophagitis   . Gastritis   . Internal hemorrhoids   . Adenomatous colon polyp 12/03/09    [facility] in [place], [place]  . CKD (chronic kidney disease), stage III     Past Surgical History  Procedure Laterality Date  . Polypectomy  2011  . Lumbar microdiscectomy  07/06/2011    R L4-5, stern  . Back surgery    . Carpal tunnel release    . Steroid spinal injections    . Hysteroscopy w/d&c N/A 03/25/2013    Procedure: DILATATION AND CURETTAGE /HYSTEROSCOPY;  Surgeon: [name], MD;  Location: [facility];  Service: Gynecology;  Laterality: N/A;  . Dilation and curettage of uterus    . Cardiac catheterization N/A 03/17/2015    Procedure: Right Heart Cath;  Surgeon: Larey Dresser, MD;  Location: [facility];  Service: Cardiovascular;  Laterality: N/A;  . Cardiac catheterization N/A 06/01/2015    Procedure: Right Heart Cath;  Surgeon: Larey Dresser, MD;  Location: [place] [facility];  Service: Cardiovascular;  Laterality: N/A;    Social History:  reports that she quit smoking about 8 months ago. Her smoking use included Cigarettes. She has a 7.25 pack-year smoking history. She has never used smokeless tobacco. She reports that she uses illicit drugs (Cocaine and Marijuana). She reports that she does not drink alcohol.  Family History:  Family History  Problem Relation Age of Onset  . Hypertension Mother   . Heart disease Mother   . Emphysema Father   . Hypertension Father   . Stomach cancer Father   . Heart disease Father   . Allergies Brother   . Stomach cancer Brother   . Congestive Heart Failure Brother   . Diabetes type II Brother   .  Heart disease Sister   . Diabetes type II Sister   . Heart attack Sister   . Stroke Sister   . Hypertension Sister   . Heart failure Sister     diastolic  . Hypertension Sister      Prior to Admission medications   Medication Sig Start Date End Date Taking? Authorizing Provider  albuterol (PROAIR HFA) 108 (90 BASE) MCG/ACT inhaler Inhale 2 puffs into the lungs every 6 (six) hours as needed for wheezing or shortness of breath. 05/01/15  Yes Rowe Clack, MD  ALPRAZolam Duanne Moron) 1 MG tablet Take 1 tablet (1 mg total) by mouth 3 (three) times daily. 06/10/15  Yes Rowe Clack, MD  aspirin EC 81 MG tablet Take 81 mg by mouth daily.   Yes Historical Provider, MD  BIOTIN PO Take 1 capsule by mouth daily.  Yes Historical Provider, MD  buPROPion (WELLBUTRIN XL) 150 MG 24 hr tablet Take 1 tablet (150 mg total) by mouth daily. 07/21/15  Yes Rowe Clack, MD  cetirizine (ZYRTEC) 1 MG/ML syrup Take 5 mLs (5 mg total) by mouth daily. 08/09/15  Yes Rowe Clack, MD  colchicine 0.6 MG tablet Take 1 tablet (0.6 mg total) by mouth 2 (two) times daily. 06/01/15  Yes Allie Bossier, MD  esomeprazole (NEXIUM) 40 MG capsule Take 1 capsule (40 mg total) by mouth 2 (two) times daily. 07/14/15  Yes Rowe Clack, MD  fluticasone (FLONASE) 50 MCG/ACT nasal spray Place 1 spray into both nostrils daily as needed for allergies or rhinitis.   Yes Historical Provider, MD  gabapentin (NEURONTIN) 300 MG capsule Take 1 capsule (300 mg total) by mouth daily. 07/06/15  Yes Rowe Clack, MD  glimepiride (AMARYL) 1 MG tablet Take 1 mg by mouth daily with breakfast.   Yes Historical Provider, MD  insulin aspart (NOVOLOG FLEXPEN) 100 UNIT/ML FlexPen Inject 15 Units into the skin 3 (three) times daily with meals. Per sliding scale   Yes Historical Provider, MD  Insulin Glargine (LANTUS SOLOSTAR) 100 UNIT/ML Solostar Pen Inject 50 Units into the skin 2 (two) times daily. 07/19/15  Yes Rowe Clack, MD  Liraglutide (VICTOZA) 18 MG/3ML SOPN Inject 3 mLs into the skin daily.   Yes Historical Provider, MD  Macitentan 10 MG TABS Take 10 mg by mouth daily. 04/11/15  Yes Barton Dubois, MD  metolazone (ZAROXOLYN) 2.5 MG tablet Take 1 tablet (2.5 mg total) by mouth 3 (three) times a week. Patient taking differently: Take 2.5 mg by mouth 3 (three) times a week. Three times a week 06/10/15  Yes Rowe Clack, MD  omega-3 fish oil (MAXEPA) 1000 MG CAPS capsule Take 1 capsule by mouth daily.   Yes Historical Provider, MD  oxyCODONE-acetaminophen (PERCOCET) 7.5-325 MG tablet Take 1 tablet by mouth 4 (four) times daily as needed for moderate pain.   Yes Historical Provider, MD  potassium chloride SA (K-DUR,KLOR-CON) 20 MEQ tablet Take 2 tablets (40 mEq total) by mouth every evening. 06/03/15  Yes Allie Bossier, MD  promethazine (PHENERGAN) 25 MG tablet TAKE 1 TABLET BY MOUTH EVERY 6 HOURS AS NEEDED FOR NAUSEA AND VOMITING 06/10/15  Yes Rowe Clack, MD  rosuvastatin (CRESTOR) 10 MG tablet Take 1 tablet (10 mg total) by mouth daily. 06/22/15  Yes Rowe Clack, MD  Tadalafil, PAH, (ADCIRCA) 20 MG TABS Take 2 tablets (40 mg total) by mouth daily. 06/22/15  Yes Larey Dresser, MD  tiZANidine (ZANAFLEX) 2 MG tablet Take 1 tablet (2 mg total) by mouth 2 (two) times daily as needed for muscle spasms. 08/10/2015  Yes Rowe Clack, MD  torsemide (DEMADEX) 100 MG tablet Take 1 tablet (100 mg total) by mouth 2 (two) times daily. 05/17/15  Yes Rowe Clack, MD  venlafaxine XR (EFFEXOR XR) 37.5 MG 24 hr capsule Take 1 capsule (37.5 mg total) by mouth daily. 06/10/15  Yes Rowe Clack, MD  buPROPion (WELLBUTRIN XL) 150 MG 24 hr tablet Take 1 tablet (150 mg total) by mouth daily. Patient not taking: Reported on 08/04/2015 07/22/15   Rowe Clack, MD  cyclobenzaprine (FLEXERIL) 5 MG tablet Take 1 tablet (5 mg total) by mouth 3 (three) times daily as needed for muscle spasms. Patient  not taking: Reported on 08/12/2015 08/09/15   Janne Napoleon, NP  insulin aspart (NOVOLOG FLEXPEN)  100 UNIT/ML FlexPen Sliding Scale: 15 units = 250 - 300, 20 units = 301 and above Patient not taking: Reported on 08/20/2015 08/09/15   Rowe Clack, MD    Physical Exam: Filed Vitals:   08/21/2015 2245 08/20/2015 2300 08/24/2015 2345 08/12/15 0015  BP: 107/71 120/69 124/80 117/64  Pulse: 98 100 100 94  Temp:      TempSrc:      Resp: 25 22 20 19   Height:      Weight:      SpO2: 98% 97% 94% 95%   General: Not in acute distress. Dry mucous and membrane. HEENT:       Eyes: PERRL, EOMI, no scleral icterus.       ENT: No discharge from the ears and nose, no pharynx injection, no tonsillar enlargement.        Neck: No JVD, no bruit, no mass felt. Heme: No neck lymph node enlargement. Cardiac: S1/S2, RRR, No murmurs, No gallops or rubs. Pulm: No rales, wheezing, rhonchi or rubs. Abd: Soft, nondistended, nontender, no rebound pain, no organomegaly, BS present. Ext: trace pitting leg edema bilaterally. 2+DP/PT pulse bilaterally. Musculoskeletal: No joint deformities, No joint redness or warmth, no limitation of ROM in spin. Skin: No rashes.  Neuro: Alert, oriented X3, cranial nerves II-XII grossly intact, muscle strength 5/5 in all extremities. Psych: Patient is not psychotic, no suicidal or hemocidal ideation.  Labs on Admission:  Basic Metabolic Panel:  Recent Labs Lab 07/27/2015 1731  NA 126*  K 2.9*  CL 81*  CO2 27  GLUCOSE 444*  BUN 26*  CREATININE 1.62*  CALCIUM 9.0   Liver Function Tests:  Recent Labs Lab 08/19/2015 1731  AST 42*  ALT 47  ALKPHOS 123  BILITOT 0.6  PROT 7.2  ALBUMIN 3.7   No results for input(s): LIPASE, AMYLASE in the last 168 hours. No results for input(s): AMMONIA in the last 168 hours. CBC:  Recent Labs Lab 08/10/2015 1731  WBC 9.8  NEUTROABS 7.7  HGB 14.1  HCT 43.9  MCV 78.5  PLT 233   Cardiac Enzymes: No results for input(s): CKTOTAL,  CKMB, CKMBINDEX, TROPONINI in the last 168 hours.  BNP (last 3 results)  Recent Labs  04/18/15 2135 05/26/15 1212 05/30/15 1701  BNP 15.1 12.9 24.0    ProBNP (last 3 results) No results for input(s): PROBNP in the last 8760 hours.  CBG:  Recent Labs Lab 08/05/2015 1731 08/16/2015 2204 08/12/15 0005  GLUCAP 459* 500* 369*    Radiological Exams on Admission: Dg Chest 2 View  08/10/2015  CLINICAL DATA:  Shortness of breath. EXAM: CHEST  2 VIEW COMPARISON:  May 30, 2015. FINDINGS: Stable cardiomediastinal silhouette. No pneumothorax or pleural effusion is noted. Stable bibasilar interstitial densities are noted most consistent with scarring. No acute pulmonary disease is noted. Bony thorax is unremarkable. IMPRESSION: Stable bibasilar interstitial densities most consistent with scarring. No acute cardiopulmonary abnormality seen. Electronically Signed   By: Marijo Conception, M.D.   On: 08/10/2015 21:52    EKG: Independently reviewed.  QTC 470, nonspecific T-wave change   Assessment/Plan Principal Problem:   Acute renal failure superimposed on stage 3 chronic kidney disease (HCC) Active Problems:   HLD (hyperlipidemia)   Morbid obesity (HCC)   Anxiety state   Essential hypertension   Chronic respiratory failure (HCC)   GERD   Obstructive sleep apnea   Obesity hypoventilation syndrome (HCC)   Diabetes type 2, uncontrolled (HCC)   Depression   COPD (chronic  obstructive pulmonary disease) with emphysema ([place])   Non-compliant behavior   Chronic low back pain   Pulmonary HTN (HCC)   Right-sided heart failure (HCC)   On home oxygen therapy   MDD (major depressive disorder), recurrent severe, without psychosis ([place])   Pulmonary sarcoidosis (HCC)   Hypokalemia  AoCKD-III: Baseline Cre is 1.2-1.3, her Cre 1.62 is on admission. Likely due to prerenal secondary to dehydration and continuation of diruetics. -will admit to tele bed for observation -Gentle IVF: Normal saline  bolus 250 mL 2, then 75 mL per hour (patient has right heart failure,limiting aggressive IV fluid treatment)  - Check FeUrea - Follow up renal function by BMP - Avoid ACEI and NSAIDs - Hold Diuretics  Hyperglycemia, DM-II and elevated AG: Last A1c 9.9 on 05/31/15, poorly controled. Patient is taking Lantus, NovoLog, Amaryl and victoza at home. Run out of her novology recently due to lack of insurance coverage. Now presents with hyperglycemia. AG=18, but no ketone in urine, bicarbonate is normal. Patient may be in early stage of DKA, but not fully developed yet.  -Continue home does of lantus: 50 units twice a day -SSI-moderate scale  -IV fluid as above  -Hold diuretics  -Consult to case manager for home medication need  HLD: Last LDL was 178 on 04/06/14  -Continue home medications:  Crestor   Depression and anxiety: Stable, no suicidal or homicidal ideations. -Continue home medications: Wellbutrin, Xanax, Effexor  Chronic respiratory failure ([place]) due to pulmonary sarcoidosis, COPD and asthma: On 4 L oxygen at home. Stable. -When necessary albuterol inhaler  GERD: -Protonix  OSA: -CPAP  Chronic low back pain: -Continue Percocet, tizanidine, Neurontin  Pulmonary HTN ([place]): -Macitentan and Tadalafil  Right-sided heart failure ([place]): 2-D echo on 12/21/14 showed pulmonary systolic pressure severely increased, and EF of 55-70 percent with grade 1 diastolic dysfunction. Patient is dehydrated on admission secondary to hyperglycemia-induced diuresis. -Hold metolazone and furosemide  -Check BNP   Hypokalemia: K= 2.9 on admission.  - Repleted with IV 10 mEq x 2 and oral 40 mEQ x 1 - Continue home KCl 40 mEq daily - Check Mg level   DVT ppx: SQ Heparin    Code Status: Full code Family Communication: None at bed side.   Disposition Plan: Admit to inpatient   Date of Service 08/12/2015    Ivor Costa [facility] Pager [phone]  If 7PM-7AM, please contact  night-coverage [email or web address] Password [email or web address] 08/12/2015, 1:37 AM

## 2015-08-12 NOTE — Evaluation (Signed)
Physical Therapy Evaluation Patient Details Name: [name] MRN: [id number] DOB: 15-Nov-1965 Today's Date: 08/12/2015   History of Present Illness  [name] is a 49 y.o. admitted for uncontrolled blood sugar, frequent urination, blurred vision, generalized weakness, worsening SOB, and nausea. PMH of OHS/OSA on Cpap and home oxygen, chronic diastolic CHF with prominent RV dysfunction, pulmonary hypertension, Fibromyalgia, Pulmonary sarcoidosis, HTN, dyslipidemia, and COPD.  Clinical Impression  Pt admitted with the above complications. Pt currently with functional limitations due to the deficits listed below (see PT Problem List). Overall, demonstrates good stability with gait however SpO2 drops rapidly to 79% on 6L supplemental O2. Improves quickly to mid 90s with seated rest break. Pt will benefit from skilled PT to increase their independence and safety with mobility to allow discharge to the venue listed below.       Follow Up Recommendations Other (comment) (Cardiopulmonary rehab)    Equipment Recommendations  None recommended by PT    Recommendations for Other Services       Precautions / Restrictions Precautions Precautions: None Precaution Comments: monitor O2 Restrictions Weight Bearing Restrictions: No      Mobility  Bed Mobility Overal bed mobility: Modified Independent             General bed mobility comments: sitting in recliner  Transfers Overall transfer level: Modified independent                  Ambulation/Gait Ambulation/Gait assistance: Supervision Ambulation Distance (Feet): 100 Feet Assistive device: None Gait Pattern/deviations: Step-through pattern;Wide base of support Gait velocity: decreased Gait velocity interpretation: Below normal speed for age/gender General Gait Details: Minor sway, wide BOS. No overt loss of balance noted however. Cues for pursed lip breathing and energy conservation technique. SpO2 dropped to 79%  on 6L supplemental O2 but improves rapidly with standing and seated rest breaks.  Stairs            Wheelchair Mobility    Modified Rankin (Stroke Patients Only)       Balance Overall balance assessment: Needs assistance Sitting-balance support: No upper extremity supported;Feet supported Sitting balance-Leahy Scale: Good     Standing balance support: No upper extremity supported Standing balance-Leahy Scale: Fair                               Pertinent Vitals/Pain Pain Assessment: Faces Faces Pain Scale: Hurts little more Pain Location: back, knees Pain Descriptors / Indicators: Aching Pain Intervention(s): Repositioned;Monitored during session;Limited activity within patient's tolerance    Home Living Family/patient expects to be discharged to:: Private residence Living Arrangements: Alone Available Help at Discharge: Personal care attendant;Available PRN/intermittently Type of Home: Apartment Home Access: Level entry     Home Layout: One level Home Equipment: Walker - 2 wheels;Bedside commode;Tub bench;Adaptive equipment Additional Comments: Pt has a PCA 3 hours/day 7 days/week.     Prior Function Level of Independence: Needs assistance   Gait / Transfers Assistance Needed: States she sometimes uses a RW  ADL's / Homemaking Assistance Needed: Aide assist with bathing, dressing, meal prep, housekeeping.  Comments: states she is on 4-6 supplemental O2 at home.     Hand Dominance   Dominant Hand: Right    Extremity/Trunk Assessment   Upper Extremity Assessment: Generalized weakness;Overall [facility] for tasks assessed           Lower Extremity Assessment: Defer to PT evaluation      Cervical / Trunk  Assessment: Other exceptions (hx of chronic back pain)  Communication   Communication: No difficulties  Cognition Arousal/Alertness: Awake/alert Behavior During Therapy: WFL for tasks assessed/performed Overall Cognitive Status: Within  Functional Limits for tasks assessed                      General Comments General comments (skin integrity, edema, etc.): SpO2 on 3.5 L at rest 93%. Ambulating on 4L dropped to 81%, dropped further to 79% on 6L supplemental O2 until pt able to stand and rest or sit and rest with cues for pursed lip breathing.    Exercises        Assessment/Plan    PT Assessment Patient needs continued PT services  PT Diagnosis Difficulty walking;Generalized weakness;Acute pain;Abnormality of gait   PT Problem List Decreased strength;Decreased activity tolerance;Decreased balance;Decreased mobility;Decreased knowledge of use of DME;Cardiopulmonary status limiting activity;Obesity;Pain  PT Treatment Interventions DME instruction;Gait training;Functional mobility training;Therapeutic activities;Therapeutic exercise;Balance training;Patient/family education   PT Goals (Current goals can be found in the Care Plan section) Acute Rehab PT Goals Patient Stated Goal: go to sleep PT Goal Formulation: With patient Time For Goal Achievement: 08/26/15 Potential to Achieve Goals: Fair    Frequency Min 3X/week   Barriers to discharge Decreased caregiver support lives alone, has aide daily    Co-evaluation               End of Session Equipment Utilized During Treatment: Oxygen Activity Tolerance: Patient tolerated treatment well Patient left: in bed;with call bell/phone within reach (sitting EOB speaking with PA) Nurse Communication: Mobility status;Other (comment) (SpO2)    Functional Assessment Tool Used: clinical Observation Functional Limitation: Mobility: Walking and moving around Mobility: Walking and Moving Around Current Status [phone]): At least 1 percent but less than 20 percent impaired, limited or restricted Mobility: Walking and Moving Around Goal Status [phone]): At least 1 percent but less than 20 percent impaired, limited or restricted    Time: [id number] PT Time Calculation  (min) (ACUTE ONLY): 14 min   Charges:   PT Evaluation $Initial PT Evaluation Tier I: 1 Procedure     PT G Codes:   PT G-Codes **NOT FOR INPATIENT CLASS** Functional Assessment Tool Used: clinical Observation Functional Limitation: Mobility: Walking and moving around Mobility: Walking and Moving Around Current Status [id number]): At least 1 percent but less than 20 percent impaired, limited or restricted Mobility: Walking and Moving Around Goal Status [phone]): At least 1 percent but less than 20 percent impaired, limited or restricted    Ellouise Newer 08/12/2015, 3:52 PM Camille Bal [place], [place]

## 2015-08-12 NOTE — Progress Notes (Signed)
Patient requested to speak to the CN due to a complaint she had regarding her pain control. She stated an assistant from Dr. Marcial Pacas office had come to see her and stated they would order a "pain shot of dilaudid" for her osteoarthritis in her right knee. She was upset that her RN had paged Dr. Allyson Sabal who "is not her doctor" and "requested a different medication instead of dilaudid". I explained to her that we did not receive an order for dilaudid from anyone from Dr. Marcial Pacas office, that her nurse was simply paging the doctor that is her attending while she's in the hospital, and that the doctor decides on what medication to order, not the nurse. Dr. Allyson Sabal called patient's nurse and spoke on speaker phone in the patient's room to explain she would order a one time dose of IV dilaudid and also explained the pain medication regime they were going to follow. Patient stated satisfaction with this plan of care. I stayed and continued to speak to [name] for another 15-20 minutes regarding her concerns about her insurance and medications. She expressed gratitude for getting the situation resolved.  Yvette Jersey, RN.

## 2015-08-12 NOTE — Progress Notes (Signed)
RT set up pt cpap, pt stated she would place herself on for the night when she was ready. Pt stated she would call if she needed any help with cpap during the night

## 2015-08-12 NOTE — ED Notes (Signed)
Ice chips provided to patient per patient request with approval from [place], [place].

## 2015-08-13 ENCOUNTER — Other Ambulatory Visit: Payer: Self-pay | Admitting: Adult Health

## 2015-08-13 ENCOUNTER — Telehealth: Payer: Self-pay

## 2015-08-13 DIAGNOSIS — E662 Morbid (severe) obesity with alveolar hypoventilation: Secondary | ICD-10-CM | POA: Diagnosis not present

## 2015-08-13 DIAGNOSIS — I10 Essential (primary) hypertension: Secondary | ICD-10-CM | POA: Diagnosis not present

## 2015-08-13 DIAGNOSIS — I272 Other secondary pulmonary hypertension: Secondary | ICD-10-CM | POA: Diagnosis not present

## 2015-08-13 DIAGNOSIS — N179 Acute kidney failure, unspecified: Secondary | ICD-10-CM | POA: Diagnosis not present

## 2015-08-13 LAB — COMPREHENSIVE METABOLIC PANEL
ALT: 50 U/L (ref 14–54)
ANION GAP: 9 (ref 5–15)
AST: 46 U/L — ABNORMAL HIGH (ref 15–41)
Albumin: 3.1 g/dL — ABNORMAL LOW (ref 3.5–5.0)
Alkaline Phosphatase: 135 U/L — ABNORMAL HIGH (ref 38–126)
BUN: 16 mg/dL (ref 6–20)
CHLORIDE: 99 mmol/L — AB (ref 101–111)
CO2: 25 mmol/L (ref 22–32)
Calcium: 9.3 mg/dL (ref 8.9–10.3)
Creatinine, Ser: 0.97 mg/dL (ref 0.44–1.00)
GFR calc non Af Amer: >60 mL/min (ref >60)
Glucose, Bld: 342 mg/dL — ABNORMAL HIGH (ref 65–99)
Potassium: 4.6 mmol/L (ref 3.5–5.1)
SODIUM: 133 mmol/L — AB (ref 135–145)
Total Bilirubin: 0.7 mg/dL (ref 0.3–1.2)
Total Protein: 6.3 g/dL — ABNORMAL LOW (ref 6.5–8.1)

## 2015-08-13 LAB — BLOOD GAS, ARTERIAL
Acid-base deficit: 16.4 mmol/L — ABNORMAL HIGH (ref 0.0–2.0)
BICARBONATE: 14.2 meq/L — AB (ref 20.0–24.0)
FIO2: 100
O2 Saturation: 85.9 %
PCO2 ART: 73.6 mmHg — AB (ref 35.0–45.0)
PH ART: 6.915 — AB (ref 7.350–7.450)
Patient temperature: 98.6
TCO2: 16.4 mmol/L (ref 0–100)
pO2, Arterial: 87.9 mmHg (ref 80.0–100.0)

## 2015-08-13 LAB — GLUCOSE, CAPILLARY
GLUCOSE-CAPILLARY: 264 mg/dL — AB (ref 65–99)
GLUCOSE-CAPILLARY: 254 mg/dL — AB (ref 65–99)
GLUCOSE-CAPILLARY: 324 mg/dL — AB (ref 65–99)
Glucose-Capillary: 415 mg/dL — ABNORMAL HIGH (ref 65–99)
Glucose-Capillary: 309 mg/dL — ABNORMAL HIGH (ref 65–99)

## 2015-08-13 LAB — CBC
HCT: 41.1 % (ref 36.0–46.0)
Hemoglobin: 13.4 g/dL (ref 12.0–15.0)
MCH: 26.2 pg (ref 26.0–34.0)
MCHC: 32.6 g/dL (ref 30.0–36.0)
MCV: 80.4 fL (ref 78.0–100.0)
PLATELETS: 179 10*3/uL (ref 150–400)
RBC: 5.11 MIL/uL (ref 3.87–5.11)
RDW: 16.2 % — ABNORMAL HIGH (ref 11.5–15.5)
WBC: 8.2 10*3/uL (ref 4.0–10.5)

## 2015-08-13 LAB — UREA NITROGEN, URINE: Urea Nitrogen, Ur: 555 mg/dL

## 2015-08-13 MED ORDER — INSULIN GLARGINE 100 UNIT/ML SOLOSTAR PEN: 70.0000 [IU] | PEN_INJECTOR | Freq: Two times a day (BID) | SUBCUTANEOUS | Status: DC

## 2015-08-13 MED ORDER — NALOXONE HCL 0.4 MG/ML IJ SOLN
INTRAMUSCULAR | Status: DC
Start: 2015-08-13 — End: 2015-08-14
  Filled 2015-08-13: qty 1

## 2015-08-13 MED ORDER — BUDESONIDE-FORMOTEROL FUMARATE 160-4.5 MCG/ACT IN AERO: 1.0000 | INHALATION_SPRAY | Freq: Two times a day (BID) | RESPIRATORY_TRACT | Status: AC

## 2015-08-13 MED ORDER — NALOXONE HCL 0.4 MG/ML IJ SOLN
INTRAMUSCULAR | Status: AC
  Filled 2015-08-13: qty 1

## 2015-08-13 MED ORDER — INSULIN GLARGINE 100 UNIT/ML SOLOSTAR PEN: 70.0000 [IU] | PEN_INJECTOR | Freq: Two times a day (BID) | SUBCUTANEOUS | Status: AC

## 2015-08-13 MED ORDER — METOLAZONE 2.5 MG PO TABS: 2.5000 mg | ORAL_TABLET | ORAL | Status: AC

## 2015-08-13 MED ORDER — TIOTROPIUM BROMIDE MONOHYDRATE 18 MCG IN CAPS: 18.0000 ug | ORAL_CAPSULE | Freq: Every day | RESPIRATORY_TRACT | Status: AC

## 2015-08-13 MED ORDER — TORSEMIDE 100 MG PO TABS: 100.0000 mg | ORAL_TABLET | Freq: Two times a day (BID) | ORAL | Status: AC

## 2015-08-13 MED ORDER — OXYCODONE-ACETAMINOPHEN 7.5-325 MG PO TABS: 1.0000 | ORAL_TABLET | Freq: Four times a day (QID) | ORAL | Status: AC | PRN

## 2015-08-13 MED ORDER — INSULIN LISPRO 100 UNIT/ML ~~LOC~~ SOLN: 15.0000 [IU] | Freq: Three times a day (TID) | SUBCUTANEOUS | Status: DC

## 2015-08-13 MED ORDER — MUSCLE RUB 10-15 % EX CREA
TOPICAL_CREAM | CUTANEOUS | Status: DC | PRN
  Administered 2015-08-13 (×2): via TOPICAL
  Filled 2015-08-13: qty 85

## 2015-08-13 MED ORDER — HYDROXYZINE HCL 25 MG PO TABS: 25.0000 mg | ORAL_TABLET | Freq: Four times a day (QID) | ORAL | Status: AC | PRN

## 2015-08-13 MED ORDER — INSULIN ASPART 100 UNIT/ML ~~LOC~~ SOLN
10.0000 [IU] | Freq: Once | SUBCUTANEOUS | Status: AC
  Administered 2015-08-13: 10 [IU] via SUBCUTANEOUS

## 2015-08-13 MED ORDER — HYDROXYZINE HCL 25 MG PO TABS
25.0000 mg | ORAL_TABLET | Freq: Four times a day (QID) | ORAL | Status: DC | PRN
  Administered 2015-08-13: 25 mg via ORAL
  Filled 2015-08-13: qty 1

## 2015-08-13 MED ORDER — INSULIN GLARGINE 100 UNIT/ML ~~LOC~~ SOLN
50.0000 [IU] | Freq: Once | SUBCUTANEOUS | Status: DC
  Filled 2015-08-13: qty 0.5

## 2015-08-13 MED ORDER — INSULIN ASPART 100 UNIT/ML ~~LOC~~ SOLN
10.0000 [IU] | Freq: Three times a day (TID) | SUBCUTANEOUS | Status: DC
  Administered 2015-08-13 (×2): 10 [IU] via SUBCUTANEOUS

## 2015-08-13 MED ORDER — INSULIN GLARGINE 100 UNIT/ML ~~LOC~~ SOLN
20.0000 [IU] | Freq: Once | SUBCUTANEOUS | Status: AC
  Administered 2015-08-13: 20 [IU] via SUBCUTANEOUS
  Filled 2015-08-13: qty 0.2

## 2015-08-13 MED FILL — Medication: Qty: 1 | Status: AC

## 2015-08-16 ENCOUNTER — Encounter (HOSPITAL_COMMUNITY): Payer: Self-pay

## 2015-08-17 MED FILL — Medication: Qty: 1 | Status: AC

## 2015-08-21 DIAGNOSIS — J9819 Other pulmonary collapse: Secondary | ICD-10-CM | POA: Diagnosis not present

## 2015-08-23 ENCOUNTER — Ambulatory Visit (HOSPITAL_COMMUNITY): Payer: Self-pay

## 2015-08-26 NOTE — Progress Notes (Signed)
eLink Physician-Brief Progress Note Patient Name: [name] DOB: 06-13-1966 MRN: [id number]   Date of Service  09-04-15  HPI/Events of Note  Pt coded 25 min on floors Arrived to [employer] in and ran code with resident staff  eICU Interventions  Code lasted another 16 min, with PEA Called code, pronounced by resident staff  Some history raised for OD on floor?\ resident will call med examiner case to review     Intervention Category Major Interventions: End of life / care limitation discussion;Code management / supervision  Raylene Miyamoto. 09-04-15, 7:45 PM

## 2015-08-26 NOTE — Progress Notes (Signed)
Blood sugar >250. Dr notified

## 2015-08-26 NOTE — Progress Notes (Signed)
Entered room, after patient called to the desk stating, "I can't breath."  Patient stated, "I am having a panic attack, because my sister just left and she got me worked up.  I don't want her to see me.  I don't know why she came up here."  Per patient request, provided a cold ginger ale, decreased room temperature and opened door to increase room circulation.  Asked patient what else the staff could do to help her relax.  Patient stated that she would feel better if she laid down and watched Dr. Abbe Amsterdam.  Correct station found on television.  Patient stated that she was feeling more calm and that Dr. Abbe Amsterdam would relax her.  Left room and requested that patient call if she has any additional concerns.  Jillyn Ledger, MBA, BS, RN

## 2015-08-26 NOTE — Discharge Summary (Signed)
Physician Discharge Summary  [name] MRN: [id number] DOB/AGE: 48-Jul-1967 79 y.o.  PCP: Gwendolyn Grant, MD   Admit date: 08/14/2015 Discharge date: 08-26-15  Discharge Diagnoses:     Principal Problem:   Acute renal failure superimposed on stage 3 chronic kidney disease ([place]) Active Problems:   HLD (hyperlipidemia)   Morbid obesity ([place])   Anxiety state   Essential hypertension   Chronic respiratory failure (HCC)   GERD   Obstructive sleep apnea   Obesity hypoventilation syndrome ([employer])   Diabetes type 2, uncontrolled (HCC)   Depression   COPD (chronic obstructive pulmonary disease) with emphysema ([place])   Non-compliant behavior   Chronic low back pain   Pulmonary HTN (HCC)   Right-sided heart failure (HCC)   On home oxygen therapy   MDD (major depressive disorder), recurrent severe, without psychosis ([place])   Pulmonary sarcoidosis (HCC)   Hypokalemia    Follow-up recommendations Follow-up with PCP in 3-5 days , including all  additional recommended appointments as below Follow-up CBC, CMP in 3-5 days Patient counseled about dietary compliance, patient found to be noncompliant with dietary recommendations during this hospitalization, recommend continued nutritional counseling for status discharge      Medication List    STOP taking these medications        glimepiride 1 MG tablet  Commonly known as:  AMARYL     VICTOZA 18 MG/3ML Sopn  Generic drug:  Liraglutide      TAKE these medications        albuterol 108 (90 BASE) MCG/ACT inhaler  Commonly known as:  PROAIR HFA  Inhale 2 puffs into the lungs every 6 (six) hours as needed for wheezing or shortness of breath.     ALPRAZolam 1 MG tablet  Commonly known as:  XANAX  Take 1 tablet (1 mg total) by mouth 3 (three) times daily.     aspirin EC 81 MG tablet  Take 81 mg by mouth daily.     BIOTIN PO  Take 1 capsule by mouth daily.     budesonide-formoterol 160-4.5 MCG/ACT inhaler  Commonly  known as:  SYMBICORT  Inhale 1 puff into the lungs 2 (two) times daily.     buPROPion 150 MG 24 hr tablet  Commonly known as:  WELLBUTRIN XL  Take 1 tablet (150 mg total) by mouth daily.     cetirizine 1 MG/ML syrup  Commonly known as:  ZYRTEC  Take 5 mLs (5 mg total) by mouth daily.     colchicine 0.6 MG tablet  Take 1 tablet (0.6 mg total) by mouth 2 (two) times daily.     cyclobenzaprine 5 MG tablet  Commonly known as:  FLEXERIL  Take 1 tablet (5 mg total) by mouth 3 (three) times daily as needed for muscle spasms.     esomeprazole 40 MG capsule  Commonly known as:  NEXIUM  Take 1 capsule (40 mg total) by mouth 2 (two) times daily.     fluticasone 50 MCG/ACT nasal spray  Commonly known as:  FLONASE  Place 1 spray into both nostrils daily as needed for allergies or rhinitis.     gabapentin 300 MG capsule  Commonly known as:  NEURONTIN  Take 1 capsule (300 mg total) by mouth daily.     hydrOXYzine 25 MG tablet  Commonly known as:  ATARAX/VISTARIL  Take 1 tablet (25 mg total) by mouth every 6 (six) hours as needed for itching or anxiety.     NOVOLOG FLEXPEN 100 UNIT/ML  FlexPen  Generic drug:  insulin aspart  Inject 15 Units into the skin 3 (three) times daily with meals. Per sliding scale     insulin aspart 100 UNIT/ML FlexPen  Commonly known as:  NOVOLOG FLEXPEN  Sliding Scale: 15 units = 250 - 300, 20 units = 301 and above     Insulin Glargine 100 UNIT/ML Solostar Pen  Commonly known as:  LANTUS SOLOSTAR  Inject 70 Units into the skin 2 (two) times daily.     Macitentan 10 MG Tabs  Take 10 mg by mouth daily.     metolazone 2.5 MG tablet  Commonly known as:  ZAROXOLYN  Take 1 tablet (2.5 mg total) by mouth 3 (three) times a week.     omega-3 fish oil 1000 MG Caps capsule  Commonly known as:  MAXEPA  Take 1 capsule by mouth daily.     oxyCODONE-acetaminophen 7.5-325 MG tablet  Commonly known as:  PERCOCET  Take 1 tablet by mouth 4 (four) times daily as  needed for moderate pain.     potassium chloride SA 20 MEQ tablet  Commonly known as:  K-DUR,KLOR-CON  Take 2 tablets (40 mEq total) by mouth every evening.     promethazine 25 MG tablet  Commonly known as:  PHENERGAN  TAKE 1 TABLET BY MOUTH EVERY 6 HOURS AS NEEDED FOR NAUSEA AND VOMITING     rosuvastatin 10 MG tablet  Commonly known as:  CRESTOR  Take 1 tablet (10 mg total) by mouth daily.     Tadalafil (PAH) 20 MG Tabs  Commonly known as:  ADCIRCA  Take 2 tablets (40 mg total) by mouth daily.     tiotropium 18 MCG inhalation capsule  Commonly known as:  SPIRIVA  Place 1 capsule (18 mcg total) into inhaler and inhale daily.     tiZANidine 2 MG tablet  Commonly known as:  ZANAFLEX  Take 1 tablet (2 mg total) by mouth 2 (two) times daily as needed for muscle spasms.     torsemide 100 MG tablet  Commonly known as:  DEMADEX  Take 1 tablet (100 mg total) by mouth 2 (two) times daily.     venlafaxine XR 37.5 MG 24 hr capsule  Commonly known as:  EFFEXOR XR  Take 1 capsule (37.5 mg total) by mouth daily.          Discharge Condition: Overall prognosis guarded and poor Discharge Instructions       Discharge Instructions    Diet - low sodium heart healthy    Complete by:  As directed      Increase activity slowly    Complete by:  As directed            Allergies  Allergen Reactions  . Simvastatin Other (See Comments)    Severe leg pain and burning  . Sulfamethoxazole-Trimethoprim Nausea And Vomiting    Causes Projectile Vomiting  . Zolpidem Tartrate Other (See Comments)    Chest pain  . Azithromycin Other (See Comments)    Resistant to med  . Ceftin [Cefuroxime] Swelling and Other (See Comments)    MOUTH SWELLING  . Doxycycline Other (See Comments)    Resistant to med  . Latex Itching and Rash    Also burning sensations  . Metformin And Related Diarrhea    Severe diarrhea  . Metronidazole Nausea And Vomiting  . Morphine-Naltrexone Palpitations  .  Ciprofloxacin Nausea Only  . Tramadol Itching and Nausea Only      Disposition:  01-Home or Self Care   Consults: Diabetes coordinator  Significant Diagnostic Studies:  Dg Chest 2 View  07/27/2015  CLINICAL DATA:  Shortness of breath. EXAM: CHEST  2 VIEW COMPARISON:  May 30, 2015. FINDINGS: Stable cardiomediastinal silhouette. No pneumothorax or pleural effusion is noted. Stable bibasilar interstitial densities are noted most consistent with scarring. No acute pulmonary disease is noted. Bony thorax is unremarkable. IMPRESSION: Stable bibasilar interstitial densities most consistent with scarring. No acute cardiopulmonary abnormality seen. Electronically Signed   By: Marijo Conception, M.D.   On: 08/10/2015 21:52        Filed Weights   08/25/2015 1733 08/12/15 0239 08/12/15 2029  Weight: 118.559 kg (261 lb 6 oz) 118.4 kg (261 lb 0.4 oz) 118.6 kg (261 lb 7.5 oz)     Microbiology: Recent Results (from the past 240 hour(s))  MRSA PCR Screening     Status: None   Collection Time: 08/12/15  3:55 AM  Result Value Ref Range Status   MRSA by PCR NEGATIVE NEGATIVE Final    Comment:        The GeneXpert MRSA Assay (FDA approved for NASAL specimens only), is one component of a comprehensive MRSA colonization surveillance program. It is not intended to diagnose MRSA infection nor to guide or monitor treatment for MRSA infections.        Blood Culture    Component Value Date/Time   SDES URINE, RANDOM 04/19/2015 0218   SPECREQUEST NONE 04/19/2015 0218   CULT >=100,000 COLONIES/mL ESCHERICHIA COLI 04/19/2015 0218   REPTSTATUS 04/21/2015 FINAL 04/19/2015 0218      Labs: Results for orders placed or performed during the hospital encounter of 08/23/2015 (from the past 48 hour(s))  CBG monitoring, ED     Status: Abnormal   Collection Time: 08/07/2015  5:31 PM  Result Value Ref Range   Glucose-Capillary 459 (H) 65 - 99 mg/dL  CBC with Differential     Status: Abnormal    Collection Time: 08/12/2015  5:31 PM  Result Value Ref Range   WBC 9.8 4.0 - 10.5 K/uL   RBC 5.59 (H) 3.87 - 5.11 MIL/uL   Hemoglobin 14.1 12.0 - 15.0 g/dL   HCT 43.9 36.0 - 46.0 %   MCV 78.5 78.0 - 100.0 fL   MCH 25.2 (L) 26.0 - 34.0 pg   MCHC 32.1 30.0 - 36.0 g/dL   RDW 15.9 (H) 11.5 - 15.5 %   Platelets 233 150 - 400 K/uL   Neutrophils Relative % 78 %   Neutro Abs 7.7 1.7 - 7.7 K/uL   Lymphocytes Relative 15 %   Lymphs Abs 1.5 0.7 - 4.0 K/uL   Monocytes Relative 5 %   Monocytes Absolute 0.5 0.1 - 1.0 K/uL   Eosinophils Relative 2 %   Eosinophils Absolute 0.2 0.0 - 0.7 K/uL   Basophils Relative 0 %   Basophils Absolute 0.0 0.0 - 0.1 K/uL  Comprehensive metabolic panel     Status: Abnormal   Collection Time: 08/24/2015  5:31 PM  Result Value Ref Range   Sodium 126 (L) 135 - 145 mmol/L   Potassium 2.9 (L) 3.5 - 5.1 mmol/L   Chloride 81 (L) 101 - 111 mmol/L   CO2 27 22 - 32 mmol/L   Glucose, Bld 444 (H) 65 - 99 mg/dL   BUN 26 (H) 6 - 20 mg/dL   Creatinine, Ser 1.62 (H) 0.44 - 1.00 mg/dL   Calcium 9.0 8.9 - 10.3 mg/dL   Total Protein 7.2  6.5 - 8.1 g/dL   Albumin 3.7 3.5 - 5.0 g/dL   AST 42 (H) 15 - 41 U/L   ALT 47 14 - 54 U/L   Alkaline Phosphatase 123 38 - 126 U/L   Total Bilirubin 0.6 0.3 - 1.2 mg/dL   GFR calc non Af Amer 37 (L) >60 mL/min   GFR calc Af Amer 42 (L) >60 mL/min    Comment: (NOTE) The eGFR has been calculated using the CKD EPI equation. This calculation has not been validated in all clinical situations. eGFR's persistently <60 mL/min signify possible Chronic Kidney Disease.    Anion gap 18 (H) 5 - 15  Urinalysis, Routine w reflex microscopic (not at [facility])     Status: Abnormal   Collection Time: 08/16/2015  9:00 PM  Result Value Ref Range   Color, Urine YELLOW YELLOW   APPearance CLEAR CLEAR   Specific Gravity, Urine 1.017 1.005 - 1.030   pH 5.5 5.0 - 8.0   Glucose, UA >1000 (A) NEGATIVE mg/dL   Hgb urine dipstick NEGATIVE NEGATIVE   Bilirubin Urine  NEGATIVE NEGATIVE   Ketones, ur NEGATIVE NEGATIVE mg/dL   Protein, ur NEGATIVE NEGATIVE mg/dL   Nitrite NEGATIVE NEGATIVE   Leukocytes, UA NEGATIVE NEGATIVE  Pregnancy, urine     Status: None   Collection Time: 08/10/2015  9:00 PM  Result Value Ref Range   Preg Test, Ur NEGATIVE NEGATIVE    Comment:        THE SENSITIVITY OF THIS METHODOLOGY IS >20 mIU/mL.   Urine microscopic-add on     Status: Abnormal   Collection Time: 08/21/2015  9:00 PM  Result Value Ref Range   Squamous Epithelial / LPF 0-5 (A) NONE SEEN    Comment: Please note change in reference range.   WBC, UA 0-5 0 - 5 WBC/hpf    Comment: Please note change in reference range.   RBC / HPF 0-5 0 - 5 RBC/hpf    Comment: Please note change in reference range.   Bacteria, UA RARE (A) NONE SEEN    Comment: Please note change in reference range.   Casts HYALINE CASTS (A) NEGATIVE  I-stat troponin, ED     Status: None   Collection Time: 08/02/2015  9:10 PM  Result Value Ref Range   Troponin i, poc 0.00 0.00 - 0.08 ng/mL   Comment 3            Comment: Due to the release kinetics of cTnI, a negative result within the first hours of the onset of symptoms does not rule out myocardial infarction with certainty. If myocardial infarction is still suspected, repeat the test at appropriate intervals.   POC CBG, ED     Status: Abnormal   Collection Time: 07/31/2015 10:04 PM  Result Value Ref Range   Glucose-Capillary 500 (H) 65 - 99 mg/dL  CBG monitoring, ED     Status: Abnormal   Collection Time: 08/12/15 12:05 AM  Result Value Ref Range   Glucose-Capillary 369 (H) 65 - 99 mg/dL  CBC     Status: Abnormal   Collection Time: 08/12/15  1:44 AM  Result Value Ref Range   WBC 9.8 4.0 - 10.5 K/uL   RBC 5.39 (H) 3.87 - 5.11 MIL/uL   Hemoglobin 13.8 12.0 - 15.0 g/dL   HCT 42.1 36.0 - 46.0 %   MCV 78.1 78.0 - 100.0 fL   MCH 25.6 (L) 26.0 - 34.0 pg   MCHC 32.8 30.0 - 36.0 g/dL  RDW 16.0 (H) 11.5 - 15.5 %   Platelets 219 150 - 400  K/uL  Creatinine, serum     Status: Abnormal   Collection Time: 08/12/15  1:44 AM  Result Value Ref Range   Creatinine, Ser 1.23 (H) 0.44 - 1.00 mg/dL   GFR calc non Af Amer 51 (L) >60 mL/min   GFR calc Af Amer 59 (L) >60 mL/min    Comment: (NOTE) The eGFR has been calculated using the CKD EPI equation. This calculation has not been validated in all clinical situations. eGFR's persistently <60 mL/min signify possible Chronic Kidney Disease.   Protime-INR     Status: None   Collection Time: 08/12/15  1:44 AM  Result Value Ref Range   Prothrombin Time 13.5 11.6 - 15.2 seconds   INR 1.01 0.00 - 1.49  Glucose, capillary     Status: Abnormal   Collection Time: 08/12/15  2:42 AM  Result Value Ref Range   Glucose-Capillary 335 (H) 65 - 99 mg/dL  MRSA PCR Screening     Status: None   Collection Time: 08/12/15  3:55 AM  Result Value Ref Range   MRSA by PCR NEGATIVE NEGATIVE    Comment:        The GeneXpert MRSA Assay (FDA approved for NASAL specimens only), is one component of a comprehensive MRSA colonization surveillance program. It is not intended to diagnose MRSA infection nor to guide or monitor treatment for MRSA infections.   Magnesium     Status: None   Collection Time: 08/12/15  4:21 AM  Result Value Ref Range   Magnesium 1.7 1.7 - 2.4 mg/dL  Brain natriuretic peptide     Status: None   Collection Time: 08/12/15  4:21 AM  Result Value Ref Range   B Natriuretic Peptide 20.1 0.0 - 100.0 pg/mL  Comprehensive metabolic panel     Status: Abnormal   Collection Time: 08/12/15  4:21 AM  Result Value Ref Range   Sodium 129 (L) 135 - 145 mmol/L   Potassium 3.3 (L) 3.5 - 5.1 mmol/L   Chloride 89 (L) 101 - 111 mmol/L   CO2 26 22 - 32 mmol/L   Glucose, Bld 380 (H) 65 - 99 mg/dL   BUN 22 (H) 6 - 20 mg/dL   Creatinine, Ser 1.28 (H) 0.44 - 1.00 mg/dL   Calcium 8.8 (L) 8.9 - 10.3 mg/dL   Total Protein 6.5 6.5 - 8.1 g/dL   Albumin 3.4 (L) 3.5 - 5.0 g/dL   AST 38 15 - 41 U/L    ALT 44 14 - 54 U/L   Alkaline Phosphatase 115 38 - 126 U/L   Total Bilirubin 0.7 0.3 - 1.2 mg/dL   GFR calc non Af Amer 49 (L) >60 mL/min   GFR calc Af Amer 56 (L) >60 mL/min    Comment: (NOTE) The eGFR has been calculated using the CKD EPI equation. This calculation has not been validated in all clinical situations. eGFR's persistently <60 mL/min signify possible Chronic Kidney Disease.    Anion gap 14 5 - 15  CBC     Status: Abnormal   Collection Time: 08/12/15  4:21 AM  Result Value Ref Range   WBC 9.7 4.0 - 10.5 K/uL   RBC 5.38 (H) 3.87 - 5.11 MIL/uL   Hemoglobin 14.1 12.0 - 15.0 g/dL   HCT 42.1 36.0 - 46.0 %   MCV 78.3 78.0 - 100.0 fL   MCH 26.2 26.0 - 34.0 pg   MCHC 33.5  30.0 - 36.0 g/dL   RDW 16.2 (H) 11.5 - 15.5 %   Platelets 202 150 - 400 K/uL  Creatinine, urine, random     Status: None   Collection Time: 08/12/15  5:31 AM  Result Value Ref Range   Creatinine, Urine 73.41 mg/dL  Glucose, capillary     Status: Abnormal   Collection Time: 08/12/15  7:28 AM  Result Value Ref Range   Glucose-Capillary 273 (H) 65 - 99 mg/dL  Glucose, capillary     Status: Abnormal   Collection Time: 08/12/15 11:36 AM  Result Value Ref Range   Glucose-Capillary 292 (H) 65 - 99 mg/dL  Glucose, capillary     Status: Abnormal   Collection Time: 08/12/15  4:31 PM  Result Value Ref Range   Glucose-Capillary 353 (H) 65 - 99 mg/dL   Comment 1 Notify RN    Comment 2 Document in Chart   Glucose, capillary     Status: Abnormal   Collection Time: 08/12/15  8:28 PM  Result Value Ref Range   Glucose-Capillary 335 (H) 65 - 99 mg/dL  Glucose, capillary     Status: Abnormal   Collection Time: 08-28-2015  1:39 AM  Result Value Ref Range   Glucose-Capillary 415 (H) 65 - 99 mg/dL  CBC     Status: Abnormal   Collection Time: 2015-08-28  4:28 AM  Result Value Ref Range   WBC 8.2 4.0 - 10.5 K/uL   RBC 5.11 3.87 - 5.11 MIL/uL   Hemoglobin 13.4 12.0 - 15.0 g/dL   HCT 41.1 36.0 - 46.0 %   MCV 80.4 78.0  - 100.0 fL   MCH 26.2 26.0 - 34.0 pg   MCHC 32.6 30.0 - 36.0 g/dL   RDW 16.2 (H) 11.5 - 15.5 %   Platelets 179 150 - 400 K/uL  Comprehensive metabolic panel     Status: Abnormal   Collection Time: 28-Aug-2015  4:28 AM  Result Value Ref Range   Sodium 133 (L) 135 - 145 mmol/L   Potassium 4.6 3.5 - 5.1 mmol/L    Comment: DELTA CHECK NOTED   Chloride 99 (L) 101 - 111 mmol/L   CO2 25 22 - 32 mmol/L   Glucose, Bld 342 (H) 65 - 99 mg/dL   BUN 16 6 - 20 mg/dL   Creatinine, Ser 0.97 0.44 - 1.00 mg/dL   Calcium 9.3 8.9 - 10.3 mg/dL   Total Protein 6.3 (L) 6.5 - 8.1 g/dL   Albumin 3.1 (L) 3.5 - 5.0 g/dL   AST 46 (H) 15 - 41 U/L   ALT 50 14 - 54 U/L   Alkaline Phosphatase 135 (H) 38 - 126 U/L   Total Bilirubin 0.7 0.3 - 1.2 mg/dL   GFR calc non Af Amer >60 >60 mL/min   GFR calc Af Amer >60 >60 mL/min    Comment: (NOTE) The eGFR has been calculated using the CKD EPI equation. This calculation has not been validated in all clinical situations. eGFR's persistently <60 mL/min signify possible Chronic Kidney Disease.    Anion gap 9 5 - 15  Glucose, capillary     Status: Abnormal   Collection Time: 08-28-15  7:33 AM  Result Value Ref Range   Glucose-Capillary 264 (H) 65 - 99 mg/dL   *Note: Due to a large number of results and/or encounters for the requested time period, some results have not been displayed. A complete set of results can be found in Results Review.     Lipid  Panel     Component Value Date/Time   CHOL 297* 06/10/2015 0857   TRIG 308.0* 06/10/2015 0857   TRIG 89 03/06/2010   HDL 52.10 06/10/2015 0857   CHOLHDL 6 06/10/2015 0857   VLDL 61.6* 06/10/2015 0857   LDLCALC 178* 04/06/2014 1144   LDLDIRECT 209.0 06/10/2015 0857     Lab Results  Component Value Date   HGBA1C 9.9* 05/31/2015   HGBA1C 9.3* 04/04/2015   HGBA1C 9.4* 12/19/2014     Lab Results  Component Value Date   MICROALBUR 3.0* 06/10/2015   LDLCALC 178* 04/06/2014   CREATININE 0.97 08-29-2015      HPI :*[name] is a 49 y.o. with a PMH of OHS/OSA on Cpap and home oxygen, chronic diastolic CHF with prominent RV dysfunction, pulmonary hypertension, Fibromyalgia, Pulmonary sarcoidosis, HTN, dyslipidemia, and COPD  presents with elevated blood sugar, generalized weakness and blurry vision on 11/17.In ED, patient was found to have blood sugar 444, AG=18, bicarbonate 27, potassium 2.9, negative urinalysis for UTI and ketone, negative troponin, WBC 9.8, temperature normal, tachypnea. Chest x-ray is consistent with chronic scarring changes . Cardiology was consulted for management of her diuretics in the setting of hyperosmolar nonketotic hyperglycemia. She was hospitalized for acute on chronic diastolic CHF had RHC in 04/5461 (see below) with plans to place her on ERA (macitentan) after left heart filling pressure optimized as there is some evidence for ERA use in sarcoidosis-related PAH who admitted 08/12/2015 with hyperosmolar nonketotic hyperglycemia.  [place] 03/17/15 with RA mean 16; PA 70/32, mean 47; PCWP mean 20; Cardiac Index (Thermo) 2.07; PVR 5.5 WU. Echo (3/16) with EF 65-70%,   She was on macitentan and tadalafil 20 mg daily when seen last by Dr. Aundra Dubin 06/22/2015. She did well with 6 minute walk and her tadalafil increased to 28m daily. Continued torsemide 100 mg po bid and continue metolazone M/W/Sat (three times a week). She takes KCl 40 on metolazone days. The patient has f/u appointment with 08/16/15.    She has been on chronic 4L O2 via [place] since September. No worsening leg swelling. She states that she is compliant with her medication. She states that her weight is usually in 268-274lb since seen by Dr. MAundra Dubin   The patient was admitted with hyperosmolar nonketotic hyperglycemia, blood pressure soft in the 90s- held Lasix and Zaroxolyn. Her CBG, sodium and renal function improving with IV fluids and holding diuretics. Cardiology is consulted for further management of her  diuretics.  Patient continued to be noncompliant with her diet during this hospitalization   HOSPITAL COURSE:  Hyperosmolar hyperglycemic state, last hemoglobin A1c 9.9 on 05/31/15, patient's diabetes is poorly controlled despite the fact that she takes Lantus, NovoLog, Amaryl and Victoza. Initial anion gap was 18, but the patient's bicarbonate was normal Patient's Lantus was increased to 70 units twice a day, patient will continue with NovoLog 15 units before each meal Patient will continue with sliding scale insulin with her meals Nutrition consult obtained to ensure dietary compliance Case management consultation obtained to ensure affordability of medications Patient will need follow-up ophthalmology evaluation for her blurry vision Patients Amaryl and Victoza has been discontinued to ensure compliance and better control of her diabetes  AoCKD-III: Baseline Cre is 1.2-1.3, her Cre 1.62 is on admission. Likely due to prerenal secondary to dehydration and continuation of diuretics . Improved . Diuretics can be resumed by cardiology the next 1-2 days Follow up renal function by BMP. Creatinine 0.97 on the day of discharge -  Avoid ACEI and NSAIDs  Asthma/obstructive sleep apnea on C Pap at home, on home oxygen at 4 L, chronic hypoxemic respiratory failure and chronic diastolic heart failure, pulmonary hypertension Cardiology was consulted Patient diuretics were held for 2-3 days These can be resumed over the next couple of days She has appointment with Dr Aundra Dubin in [place] clinic on Monday 08/15/15.  HLD: Last LDL was 178 on 04/06/14  -Continue home medications: Crestor   Depression and anxiety: Stable, no suicidal or homicidal ideations. -Continue home medications: Wellbutrin, Xanax, Effexor  Chronic respiratory failure ([place]) due to pulmonary sarcoidosis, COPD and asthma: On 4 L oxygen at home. Stable. Patient takes albuterol, Symbicort, Spiriva, refills  provided   GERD: -Protonix  OSA: -CPAP  Chronic low back pain: -Continue Percocet, tizanidine, Neurontin   Hypokalemia: K= 2.9 on admission. 4.6 on the day of discharge     Discharge Exam:    Blood pressure 128/65, pulse 97, temperature 98.7 F (37.1 C), temperature source Oral, resp. rate 20, height 5' 8"  (1.727 m), weight 118.6 kg (261 lb 7.5 oz), last menstrual period 10/16/2012, SpO2 92 %. General: Not in acute distress. Dry mucous and membrane. HEENT:  Eyes: PERRL, EOMI, no scleral icterus.  ENT: No discharge from the ears and nose, no pharynx injection, no tonsillar enlargement.   Neck: No JVD, no bruit, no mass felt. Heme: No neck lymph node enlargement. Cardiac: S1/S2, RRR, No murmurs, No gallops or rubs. Pulm: No rales, wheezing, rhonchi or rubs. Abd: Soft, nondistended, nontender, no rebound pain, no organomegaly, BS present. Ext: trace pitting leg edema bilaterally. 2+DP/PT pulse bilaterally. Musculoskeletal: No joint deformities, No joint redness or warmth, no limitation of ROM in spin. Skin: No rashes.  Neuro: Alert, oriented X3, cranial nerves II-XII grossly intact, muscle strength 5/5 in all extremities. Psych: Patient is not psychotic, no suicidal or hemocidal ideation.     Follow-up Information    Follow up with Gwendolyn Grant, MD. Schedule an appointment as soon as possible for a visit in 3 days.   Specialty:  Internal Medicine   Contact information:   [address] [place] [address] [phone]       Signed: Reyne Dumas 2015/08/21, 11:32 AM        Time spent >45 mins

## 2015-08-26 NOTE — Care Management Obs Status (Signed)
[place] NOTIFICATION   Patient Details  Name: [name] MRN: [id number] Date of Birth: [birth year]   Medicare Observation Status Notification Given:  Yes    Valerya Maxton, Rory Percy, RN 09-02-15, 1:19 PM

## 2015-08-26 NOTE — Plan of Care (Signed)
Problem: Food- and Nutrition-Related Knowledge Deficit (NB-1.1) Goal: Nutrition education Formal process to instruct or train a patient/client in a skill or to impart knowledge to help patients/clients voluntarily manage or modify food choices and eating behavior to maintain or improve health. Outcome: Adequate for Discharge  RD consulted for nutrition education regarding diabetes.     Lab Results  Component Value Date    HGBA1C 9.9* 05/31/2015   Spoke with pt, who reports that she was diagnosed with DM approximately 3 years ago. She reveals that she has an extensive family hx of DM on both her mom's and dad's size of the family. When discussing this, pt got very tearful saying "I have it really bad, don't I?".   Pt reports her largest barrier to controlling her DM is her diet. She reports her blood sugars are high frequently and craves sweets when this occurs. She loves "sweets" and "Red Coke". Noticed that pt had a diet, caffeine-free Coke with her lunch. Pt reports that she has tried it and is willing to transition to lower calorie soft drinks.   Pt was able to correctly identify the carbohydrate containing foods on her meal tray and accurately describe to this RD the "Plate Method". Spent the majority of the visit reinforcing portion sizes, providing examples of ways to decrease carbohydrate intake in her diet, and importance of compliance with self-management. Noted pt will be starting cardio-pulmonary rehab at the end of the month and will have access to an RD/CDE to assist with further management.   RD provided "Carbohydrate Counting for People with Diabetes" handout from the Academy of Nutrition and Dietetics. Discussed different food groups and their effects on blood sugar, emphasizing carbohydrate-containing foods. Provided list of carbohydrates and recommended serving sizes of common foods.  Discussed importance of controlled and consistent carbohydrate intake throughout the day.  Provided examples of ways to balance meals/snacks and encouraged intake of high-fiber, whole grain complex carbohydrates. Teach back method used.  Expect fair to poor compliance.  Body mass index is 39.76 kg/(m^2). Pt meets criteria for obesity, class II based on current BMI.  Current diet order is Carb Modified/ Heart Healthy, patient is consuming approximately 100% of meals at this time. Labs and medications reviewed. No further nutrition interventions warranted at this time. RD contact information provided. If additional nutrition issues arise, please re-consult RD.  Beryl Balz A. Jimmye Norman, RD, LDN, CDE Pager: [phone] After hours Pager: [phone]

## 2015-08-26 NOTE — Progress Notes (Signed)
Patient Name: [name] Date of Encounter: 20-Aug-2015  Principal Problem:   Acute renal failure superimposed on stage 3 chronic kidney disease ([place]) Active Problems:   HLD (hyperlipidemia)   Morbid obesity (HCC)   Anxiety state   Essential hypertension   Chronic respiratory failure (HCC)   GERD   Obstructive sleep apnea   Obesity hypoventilation syndrome ([place])   Diabetes type 2, uncontrolled (HCC)   Depression   COPD (chronic obstructive pulmonary disease) with emphysema ([place])   Non-compliant behavior   Chronic low back pain   Pulmonary HTN (HCC)   Right-sided heart failure (HCC)   On home oxygen therapy   MDD (major depressive disorder), recurrent severe, without psychosis ([place])   Pulmonary sarcoidosis ([place])   Hypokalemia   Length of Stay:   SUBJECTIVE  The patient complains of knee pains.  CURRENT MEDS . ALPRAZolam  1 mg Oral TID  . aspirin EC  81 mg Oral Daily  . budesonide-formoterol  1 puff Inhalation BID  . colchicine  0.6 mg Oral BID  . gabapentin  300 mg Oral Daily  . heparin  5,000 Units Subcutaneous 3 times per day  . insulin aspart  0-15 Units Subcutaneous TID WC  . insulin aspart  10 Units Subcutaneous TID WC  . insulin glargine  20 Units Subcutaneous Once  . insulin glargine  50 Units Subcutaneous BID  . loratadine  10 mg Oral Daily  . Macitentan  10 mg Oral Daily  . omega-3 acid ethyl esters  1,000 mg Oral Daily  . pantoprazole  40 mg Oral Daily  . rosuvastatin  10 mg Oral Daily  . sodium chloride  3 mL Intravenous Q12H  . Tadalafil (PAH)  2 tablet Oral Daily  . tiotropium  18 mcg Inhalation Daily  . venlafaxine XR  37.5 mg Oral Daily    OBJECTIVE  Filed Vitals:   08/12/15 1545 08/12/15 2029 08-20-15 0147 2015-08-20 0546  BP: 107/55 120/64 121/70 128/65  Pulse: 91 95 95 97  Temp: 98 F (36.7 C) 97.8 F (36.6 C) 98.5 F (36.9 C) 98.7 F (37.1 C)  TempSrc: Oral Oral Oral Oral  Resp: 22 20 19 20   Height:      Weight:  261 lb  7.5 oz (118.6 kg)    SpO2: 95% 93% 92% 92%    Intake/Output Summary (Last 24 hours) at 08-20-2015 1154 Last data filed at 2015/08/20 0603  Gross per 24 hour  Intake 1311.25 ml  Output   1200 ml  Net 111.25 ml   Filed Weights   08/16/2015 1733 08/12/15 0239 08/12/15 2029  Weight: 261 lb 6 oz (118.559 kg) 261 lb 0.4 oz (118.4 kg) 261 lb 7.5 oz (118.6 kg)   PHYSICAL EXAM  General: Pleasant, NAD. Neuro: Alert and oriented X 3. Moves all extremities spontaneously. Psych: Normal affect. HEENT:  Normal  Neck: Supple without bruits or JVD. Lungs:  Resp regular and unlabored, CTA. Heart: RRR no s3, s4, or murmurs. Abdomen: Soft, non-tender, non-distended, BS + x 4.  Extremities: No clubbing, cyanosis or edema. DP/PT/Radials 2+ and equal bilaterally.  Accessory Clinical Findings  CBC  Recent Labs  08/25/2015 1731  08/12/15 0421 08/20/2015 0428  WBC 9.8  < > 9.7 8.2  NEUTROABS 7.7  --   --   --   HGB 14.1  < > 14.1 13.4  HCT 43.9  < > 42.1 41.1  MCV 78.5  < > 78.3 80.4  PLT 233  < >  202 179  < > = values in this interval not displayed. Basic Metabolic Panel  Recent Labs  08/12/15 0421 2015-08-19 0428  NA 129* 133*  K 3.3* 4.6  CL 89* 99*  CO2 26 25  GLUCOSE 380* 342*  BUN 22* 16  CREATININE 1.28* 0.97  CALCIUM 8.8* 9.3  MG 1.7  --    Liver Function Tests  Recent Labs  08/12/15 0421 Aug 19, 2015 0428  AST 38 46*  ALT 44 50  ALKPHOS 115 135*  BILITOT 0.7 0.7  PROT 6.5 6.3*  ALBUMIN 3.4* 3.1*   Radiology/Studies  Dg Chest 2 View  07/30/2015  CLINICAL DATA:  Shortness of breath. EXAM: CHEST  2 VIEW COMPARISON:  May 30, 2015. FINDINGS: Stable cardiomediastinal silhouette. No pneumothorax or pleural effusion is noted. Stable bibasilar interstitial densities are noted most consistent with scarring. No acute pulmonary disease is noted. Bony thorax is unremarkable. IMPRESSION: Stable bibasilar interstitial densities most consistent with scarring. No acute cardiopulmonary  abnormality seen. Electronically Signed   By: [name], M.D.   On: 08/08/2015 21:52     ASSESSMENT AND PLAN  [name] is a 49 y.o. Patient of Dr [name] who follows her for morbid obesity related chronic diastolic CHD, and severe pulmonary hypertension related to lung sarcoidosis, COPD and diastolic CHF. She also has OSA on CPAP, CKD stage 2 and fibromyalgia. She is on torsemide 100 mg po bid and metolazone M/W/Sat (three times a week).Her baseline weight is 168 - 174 lbs.  She was admitted with hyperosmolar nonketotic hyperglycemia as she couldn't afford insulin. She is currently hypotensive, dehydrated, with weight 161 lbs. Crea baseline 1.2, on admission 1.6, with hydration back to 1.2. I would hold off diuretics today and tomorrow, we will reassess in the am.  We recommend to hold off diuretics until she follows with Dr [name] in [place] clinic on Monday 08/16/15.  She can be discharged from cardiac standpoint.  Signed, Yvette Spark MD, [facility] 08/19/15

## 2015-08-26 NOTE — Progress Notes (Signed)
  Patient Name: [name]   MRN: [id number]   Date of Birth/ Sex: [birth year] , female      Admission Date: 08/15/2015  Attending Provider: Reyne Dumas, MD  Primary Diagnosis: Dehydration [E86.0] Hypokalemia [E87.6] Hyperglycemia [R73.9]   Indication: Pt was in her usual state of health until this PM, when she was noted to be unresponsive on [address].  Patient regained pulse after 25 minutes on floor and transferred to 41M where she coded again. Code blue was subsequently called. At the time of arrival on scene, ACLS protocol was underway.   Technical Description:  - CPR performance duration:  16  minutes  - Was defibrillation or cardioversion used? No   - Was external pacer placed? No  - Was patient intubated pre/post CPR? Yes   Medications Administered: Y = Yes; Blank = No Amiodarone    Atropine    Calcium    Epinephrine  yes  Lidocaine    Magnesium  yes  Norepinephrine    Phenylephrine    Sodium bicarbonate    Vasopressin    Other yes   Post CPR evaluation:  - Final Status - Was patient successfully resuscitated ? No   Miscellaneous Information:  - Time of death:  07:40  PM  - Primary team notified?  Yes  - Family Notified? Yes     Jule Ser, DO   2015/08/20, 7:49 PM

## 2015-08-26 NOTE — Telephone Encounter (Signed)
Humalog sent to pharmacy  

## 2015-08-26 NOTE — Progress Notes (Signed)
  Patient Name: [name]   MRN: [id number]   Date of Birth/ Sex: 08-Sep-1966 , female      Admission Date: 07/31/2015  Attending Provider: Reyne Dumas, MD  Primary Diagnosis: Acute renal failure superimposed on stage 3 chronic kidney disease [facility])   Indication: Pt was in her usual state of health until this PM, when she was noted to be unresponsiv. Code blue was subsequently called. At the time of arrival on scene, ACLS protocol was underway.   Technical Description:  - CPR performance duration:  20  minutes  - Was defibrillation or cardioversion used? No   - Was external pacer placed? No  - Was patient intubated pre/post CPR? Yes   Medications Administered: Y = Yes; Blank = No Amiodarone    Atropine    Calcium    Epinephrine  yes  Lidocaine    Magnesium    Norepinephrine    Phenylephrine    Sodium bicarbonate  yes  Vasopressin     Post CPR evaluation:  - Final Status - Was patient successfully resuscitated ? Yes - What is current rhythm? Sinus rhythm - What is current hemodynamic status? Hemodynamically stable  Miscellaneous Information:  - Labs sent, including: ABG, BMP, CXR  - Primary team notified?  Yes  - Family Notified? No  - Additional notes/ transfer status: Patient also given 3 doses of Narcan, transferred to [place], DO  08-20-15, 7:17 PM

## 2015-08-26 NOTE — Consult Note (Signed)
   [facility] CM Inpatient Consult   09-10-15  [name] 04/01/1966 [id number]   Patient has been active with [facility] [facility] in the past. She declined further follow up at that time. Went to bedside to offer [place] [facility] services again due to frequent admits. However, she politely declines and states she does not want [facility] services. Will make inpatient RNCM aware.   Marthenia Rolling, MSN-Ed, RN,BSN [facility] Liaison [phone]

## 2015-08-26 NOTE — Progress Notes (Signed)
Time of death 62 . Rivett, MD called medical examiner as there is concern for OD on floor.

## 2015-08-26 NOTE — Consult Note (Signed)
   [facility] CM Inpatient Consult   2015-08-22  CHELSY CHOUDHURY 1966/07/31 [id number]   Spoke with [facility] Liaison, [place], to see if patient was active with their [place] [facility] program. Informed that patient is not eligible at this time due to being dually eligible for Medicare and Medicaid. Appears that patient has been contacted for other [facility] programs but patient was unable to be reached or had declined per [facility] with [facility]. Discussed how patient declined [place] [facility] services as well. Inpatient RNCM was made aware patient declined [facility].  Marthenia Rolling, MSN-Ed, RN,BSN [facility] Liaison [phone]

## 2015-08-26 NOTE — Progress Notes (Signed)
Chaplain responded to call from 50M that pt in Jan 18, 2023 had passed and that pt's sister was in 01/18/2023 awaiting her brother's arrival before going to see pt. When I got there, the brother had arrived. I accompanied pt's sister and brother to bedside and waited outside as they said their goodbyes. I shared words of comfort with pt's sister and prayed with her. She expressed thanks to chaplain for his support.

## 2015-08-26 NOTE — Progress Notes (Signed)
Patient initially coded for 25 minutes on the floor for PEA arrest. There was concern for possible narcotic overdose as her nurse found empty bottles of oxycodone and xanax that were recently filled. Additionally she was receiving oxycodone and xanax during her hospitalization. She received narcan and other appropriate medications per Dr. Alcario Drought code notes. Pulse and blood pressure were obtained and she was then sent to ICU. Patient then coded again with PEA arrest. She was coded for an additional 16 minutes. She was pronounced dead at 19:40 in assistance with the critical care physician. She did not have breath sounds, no pulse, no heart sounds, and pupils nonreactive. I will contact the medical examiner to review the case. I spoke with the patient's family and informed them of the patient's death.   Albin Felling, MD, MPH Internal Medicine Resident, PGY-II Pager: [phone]

## 2015-08-26 NOTE — Care Management Note (Signed)
Case Management Note  Patient Details  Name: [name] MRN: [id number] Date of Birth: September 02, 1966  Subjective/Objective:              CM following for progression and d/c planning.      Action/Plan: 08/12/2015 Met with pt and discussed her medication needs, reviewed pts insurance plans and provided state list of Medicaid covered insulin products for this pt to take to her pharmacist who per there pt told her that her insulin was not covered. Pt also visited by diabetes coordinator and info on coverage for insulin was reinforced.  08/31/15 Noted referral for CM to arrange CardioPulmonary Rehab. This CM contacted the [facility] and discovered that this pt is scheduled for Pulmonary Rehab with orientation to begin Aug 23, 2015. A rep from that dept with talk with this pt while she is in the hospital to confirm this appointment as they are familiar with this pt .   Expected Discharge Date:     08/31/15             Expected Discharge Plan:  Home/Self Care  In-House Referral:  NA  Discharge planning Services  CM Consult, Medication Assistance  Post Acute Care Choice:  NA Choice offered to:  NA  DME Arranged:   NA DME Agency:   NA  HH Arranged:   NA HH Agency:   NA  Status of Service:  Completed, signed off  Medicare Important Message Given:    Date Medicare IM Given:    Medicare IM give by:    Date Additional Medicare IM Given:    Additional Medicare Important Message give by:     If discussed at [place] of Stay Meetings, dates discussed:    Additional Comments:  Adron Bene, RN 08-31-2015, 12:09 PM

## 2015-08-26 NOTE — Progress Notes (Addendum)
AT Approx 1835 Pt tachy on the monitor 120s. Entered patients room and found pt sitting on the [facility].  Patient breathing rapidly on O2. Patient stated "my sister came in here and got me all upset, I don't want my toilet on this side, I'm sitting on the other side, move it"  Nurse agreed, patient got up off the [facility] and walked to the other side of the bed, nurse emptied the [facility] of urine and put it on the other side of the bed. Pt sat on bed and asked nurse to sit on bed with pt. Nurse agreed and said that the gloves that nurse was wearing needed to be thrown away. Nurse disposed of gloves, pt yelled "don't leave me".  Nurse reassured patient she wasn't leaving. Pt's breathing very rapid. Pt stood up and leaned on bedside table and said "calm me down, rub my back" Nurse rubbed pts back and asked patient to breath in through her nose. Pt on 4L O2. Pt continued to say "Rub my back" and stand over the bedside table. Nurse pushed button on bed for assistance. Pt took off her clothes, telemetry box and O2. Nurse said you need to have your telemetry and O2 on.  Pt said No to the telemetry and told the nurse to put her O2 back on.  Nurse put O2 back on the pt. Pt yelling "calm me down, rub my back" Pt stated "I need water" Pt walked to sink. Nurse called out in hall. Nurse then used phone to call front desk and told Network engineer to send down Hulan Amato and Adelfa Koh RN. Patient at sink splashing water on her face. Jonni Sanger walked in the room. Jonni Sanger talked to the patient telling pt to call down.  Patient sat in the chair. Pt told Jonni Sanger to knock her out. Jonni Sanger said I cant do that. Then patient lost consciousness. Telemetry reapplied to pt. Pts HR in the 40s, Pt breathing. Then Tammy RN entered the room. Tammy tried calling rapid, Tammy then went to the desk and and requested rapid and brought crash cart to the room. Then code blue was called. Empty prescription bottle found in room. Oxycodone found and Xanax. Pills  still in xanax bottle. Pt placed back in bed.

## 2015-08-26 NOTE — Progress Notes (Signed)
Patient arrived on unit around 1920. Patient hooked up to monitor. No pulse felt, patient appeared to be in PEA. Code initiated @ 1923. See code sheet for further details.

## 2015-08-26 NOTE — Progress Notes (Signed)
PT Cancellation Note  Patient Details Name: [name] MRN: [id number] DOB: 1965/11/03   Cancelled Treatment:    Reason Eval/Treat Not Completed: Patient declined, no reason specified Pt agitated. Declines to work with therapy. States her knees hurt and plans on having pulmonary rehab at d/c. Will follow and work with patient when ready.  Ellouise Newer 09-03-15, 4:35 PM  Camille Bal Bas[place], [place]

## 2015-08-26 DEATH — deceased

## 2015-08-30 ENCOUNTER — Other Ambulatory Visit: Payer: Self-pay | Admitting: Internal Medicine

## 2015-09-06 ENCOUNTER — Ambulatory Visit: Payer: Self-pay | Admitting: Adult Health

## 2015-10-07 ENCOUNTER — Other Ambulatory Visit: Payer: Self-pay

## 2015-10-07 NOTE — Progress Notes (Signed)
Late entry to missed g-code.   Oct 29, 2015 0800  OT G-codes **NOT FOR INPATIENT CLASS**  Functional Assessment Tool Used clinical judgement  Functional Limitation Self care  Self Care Current Status [phone]) CI  Self Care Goal Status [id number]) CI  Self Care Discharge Status [phone]) CI  2015/10/29 Nestor Lewandowsky, OTR/L Pager: [phone]

## 2015-10-10 NOTE — Progress Notes (Signed)
Entered pts chart for QI data

## 2016-01-19 NOTE — Progress Notes (Signed)
This encounter was created in error - please disregard.

## 2016-01-19 NOTE — Addendum Note (Signed)
Addended by: Tobi Bastos on: 01/19/2016 09:46 PM   Modules accepted: Level of Service, SmartSet

## 2017-01-22 IMAGING — CR DG CHEST 2V
2 series · 2 of 2 positions shown · non-contrast
Comparison: Chest x-ray 10/23/2014 and chest CT 01/19/2014

CLINICAL DATA: Chest pain.  History of sarcoidosis.

EXAM:
CHEST  2 VIEW

[w chest pa]
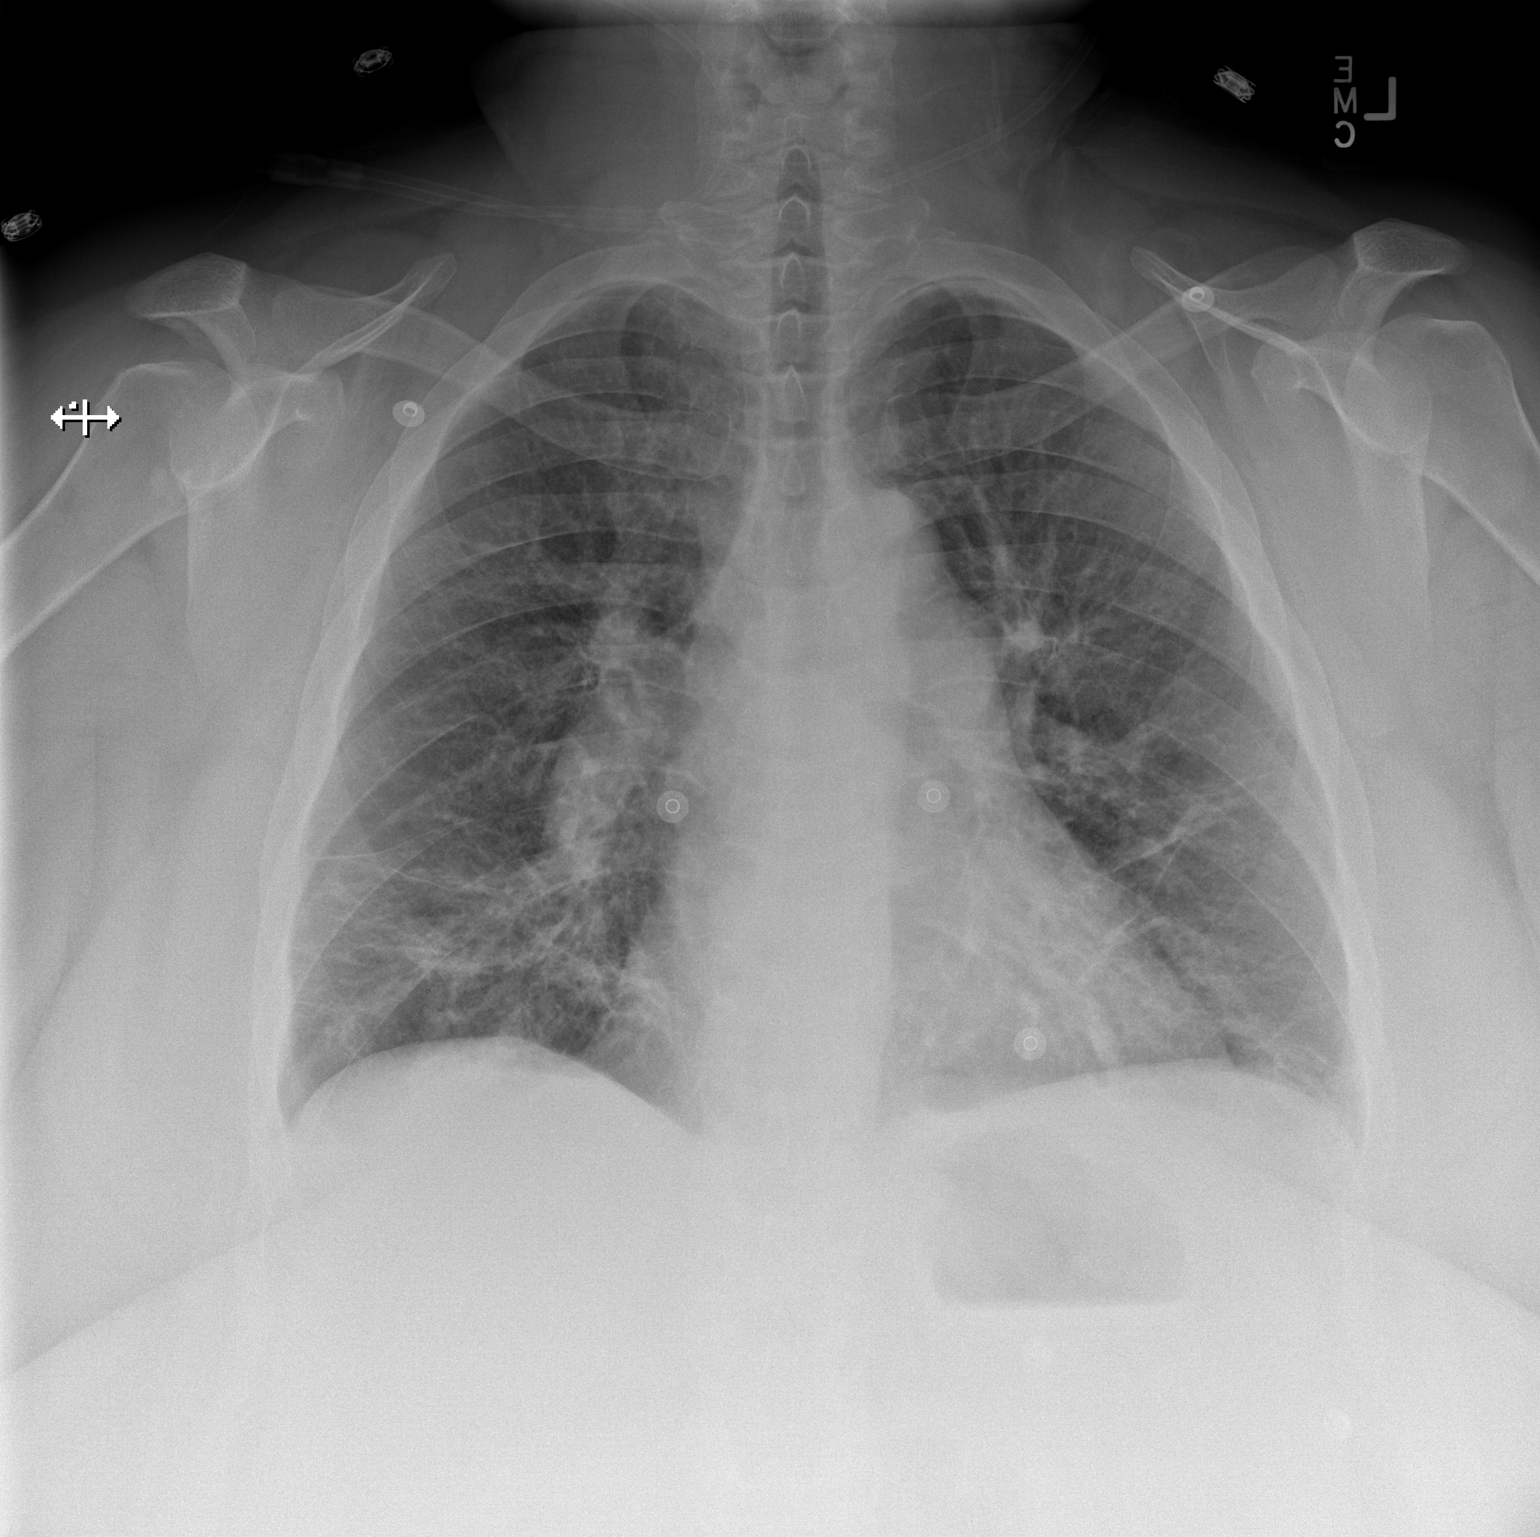

[w chest lat]
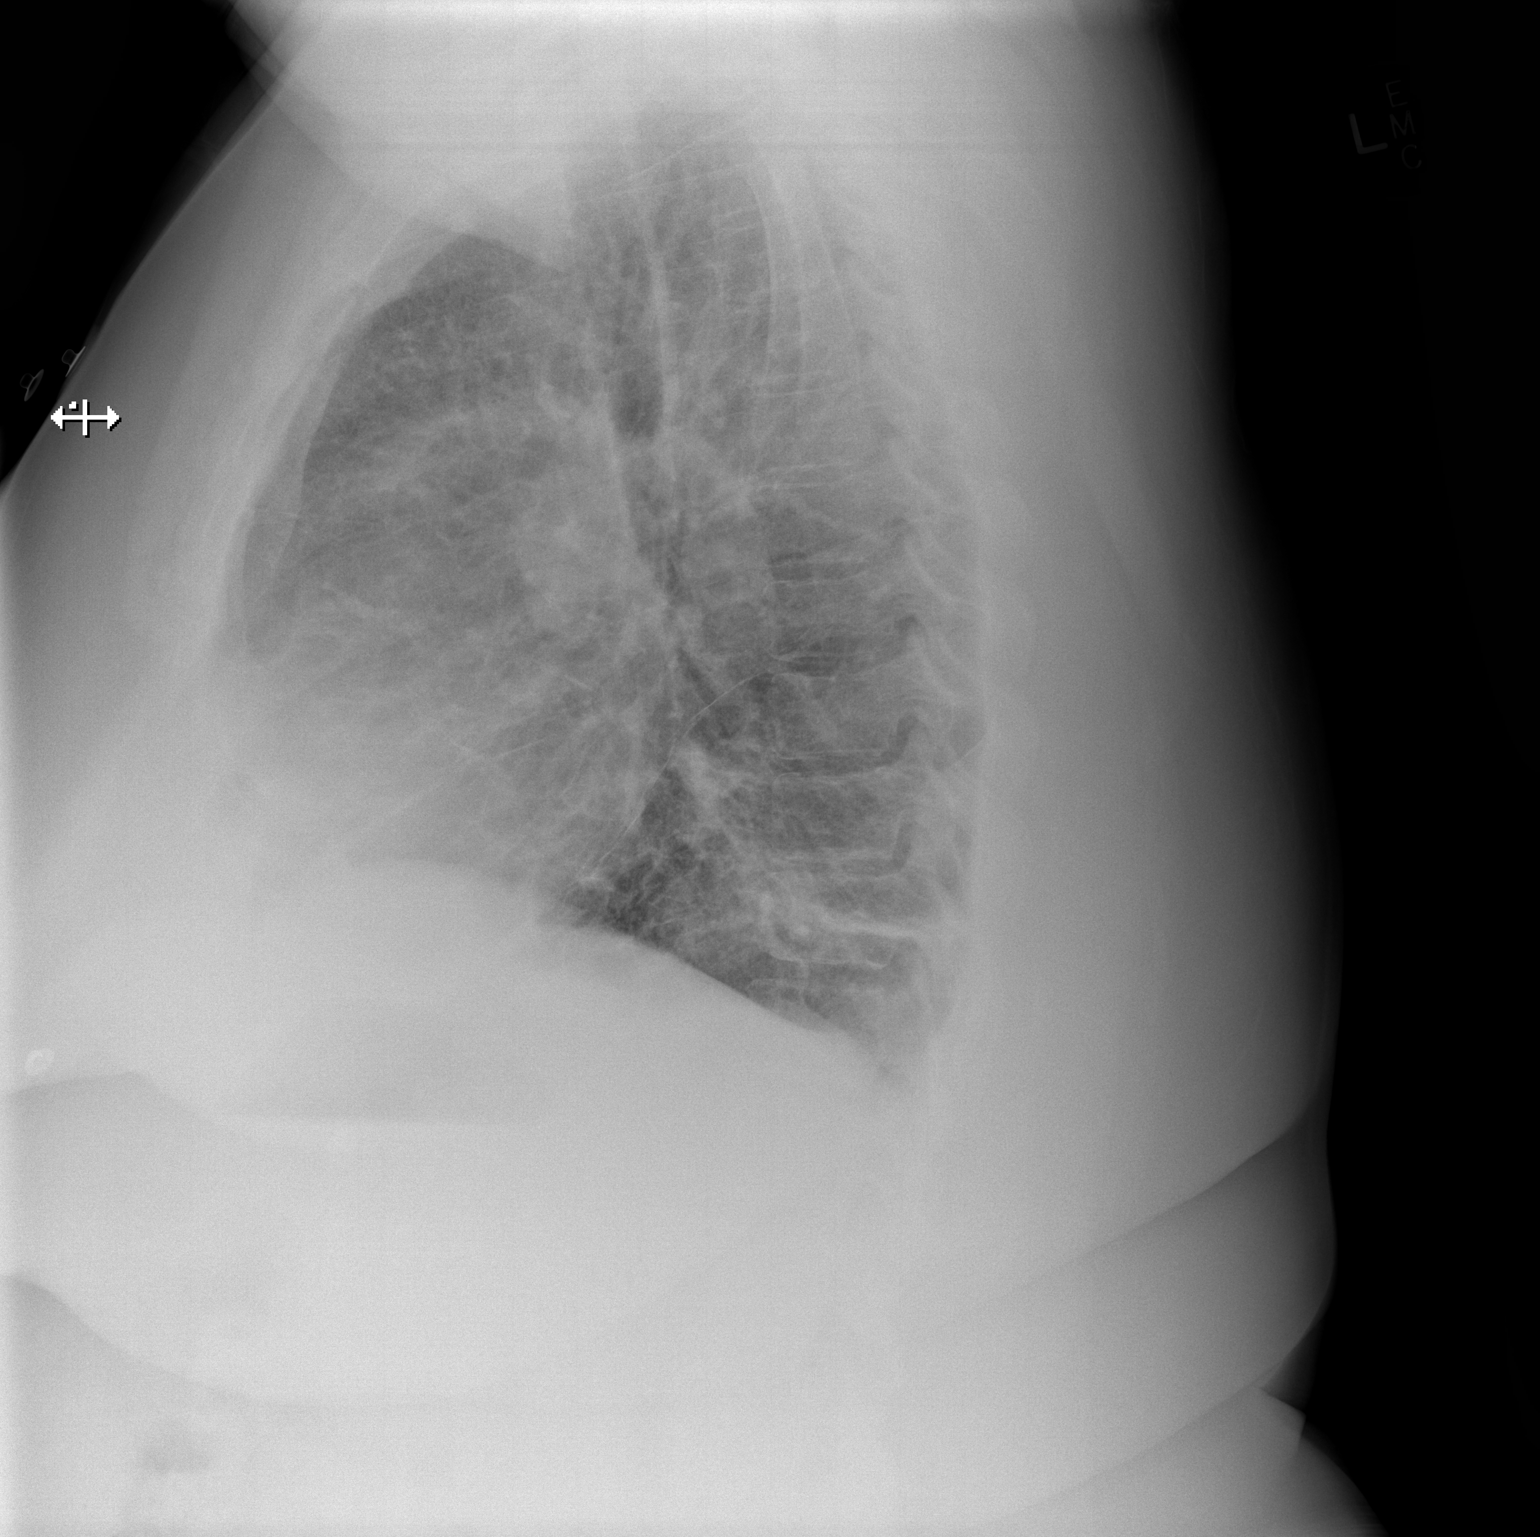

[2 of 2 positions shown; findings below may reference images not displayed]

FINDINGS: The cardiac silhouette, mediastinal and hilar contours are stable.
Stable mediastinal and hilar adenopathy. The lungs demonstrate
emphysematous changes and pulmonary scarring consistent with
emphysema and sarcoidosis. No acute overlying pulmonary process. No
pleural effusion. The bony thorax is intact.
IMPRESSION: Chronic lung changes but no acute pulmonary findings.

## 2017-07-10 IMAGING — DX DG CHEST 2V
2 series · 2 of 2 positions shown · non-contrast
Comparison: April 12, 2015 and March 15, 2015 chest radiograph; chest
CT March 16, 2015

CLINICAL DATA: Shortness of breath.  History of sarcoidosis

EXAM:
CHEST  2 VIEW

[x chest ap]
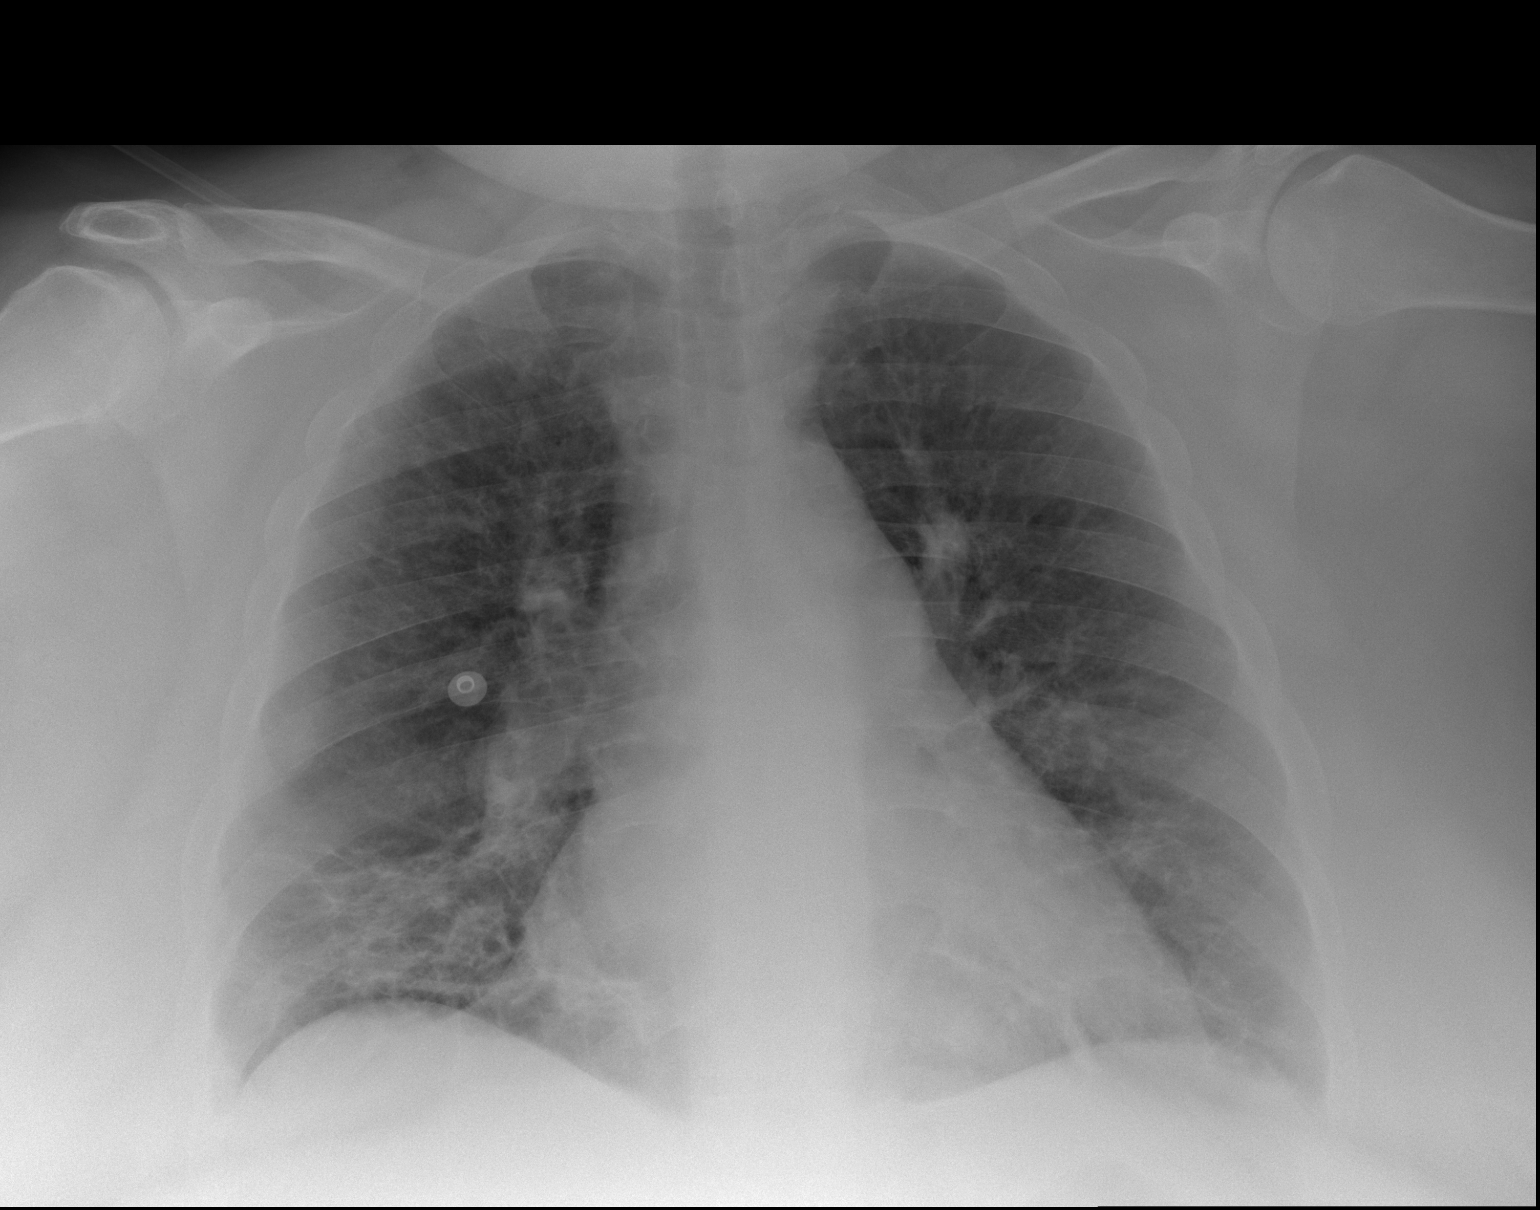

[w chest lat]
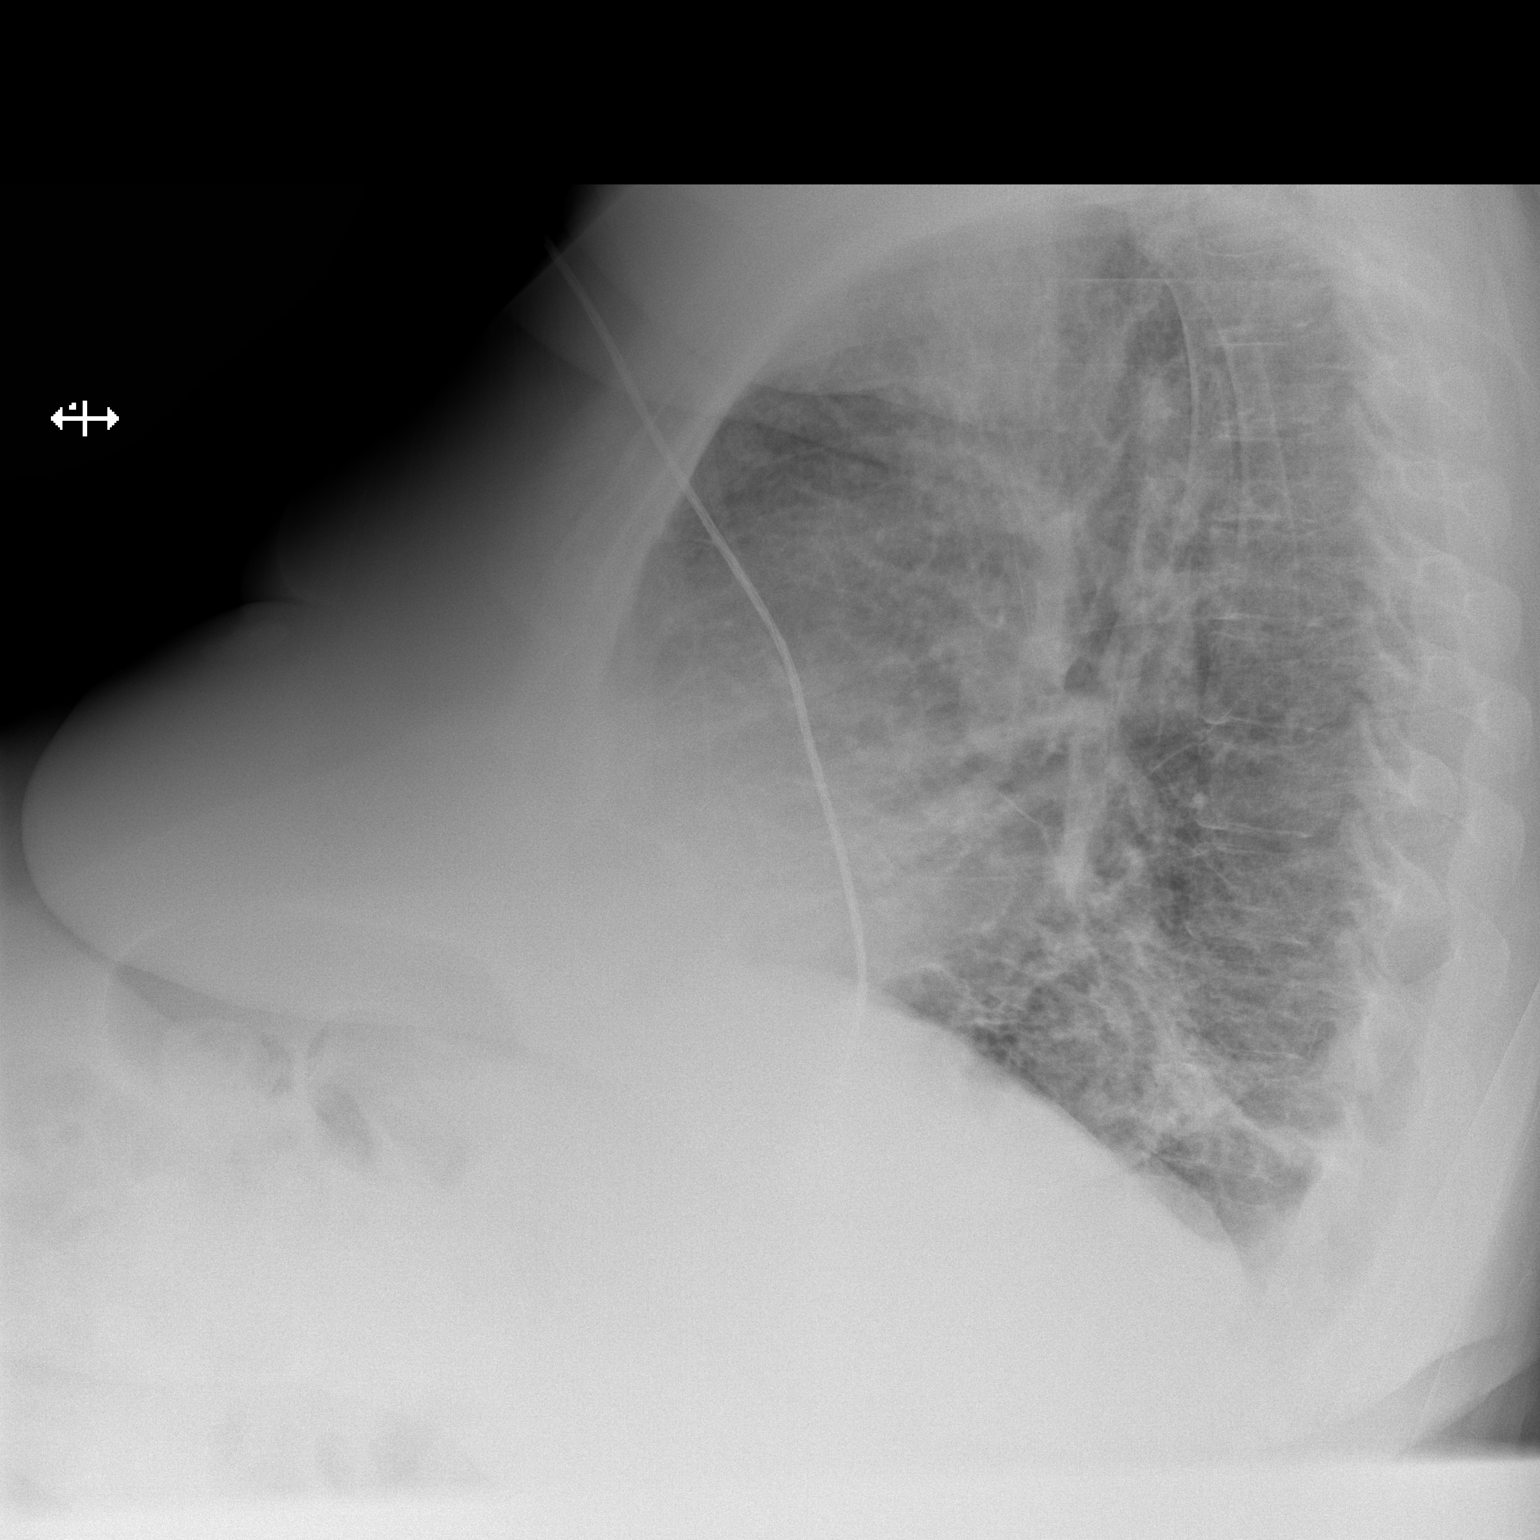

[2 of 2 positions shown; findings below may reference images not displayed]

FINDINGS: There is stable scarring/ fibrotic type change in the lung bases,
more severe on the right than on the left. There is also mild
scarring in the medial left upper lobe. There is no frank edema or
consolidation. The heart is upper normal in size with pulmonary
vascularity within normal limits. No adenopathy apparent. No bone
lesions.
IMPRESSION: Areas of scarring, most pronounced in the right lower lobe, stable.
No new opacity. No change in cardiac silhouette.

## 2017-07-13 IMAGING — DX DG CHEST 2V
2 series · 2 of 2 positions shown · non-contrast
Comparison: CT angio chest of 04/19/2015 and chest x-ray of
04/18/2015

CLINICAL DATA: Acute respiratory failure, hypoxia

EXAM:
CHEST  2 VIEW

[chest pa]
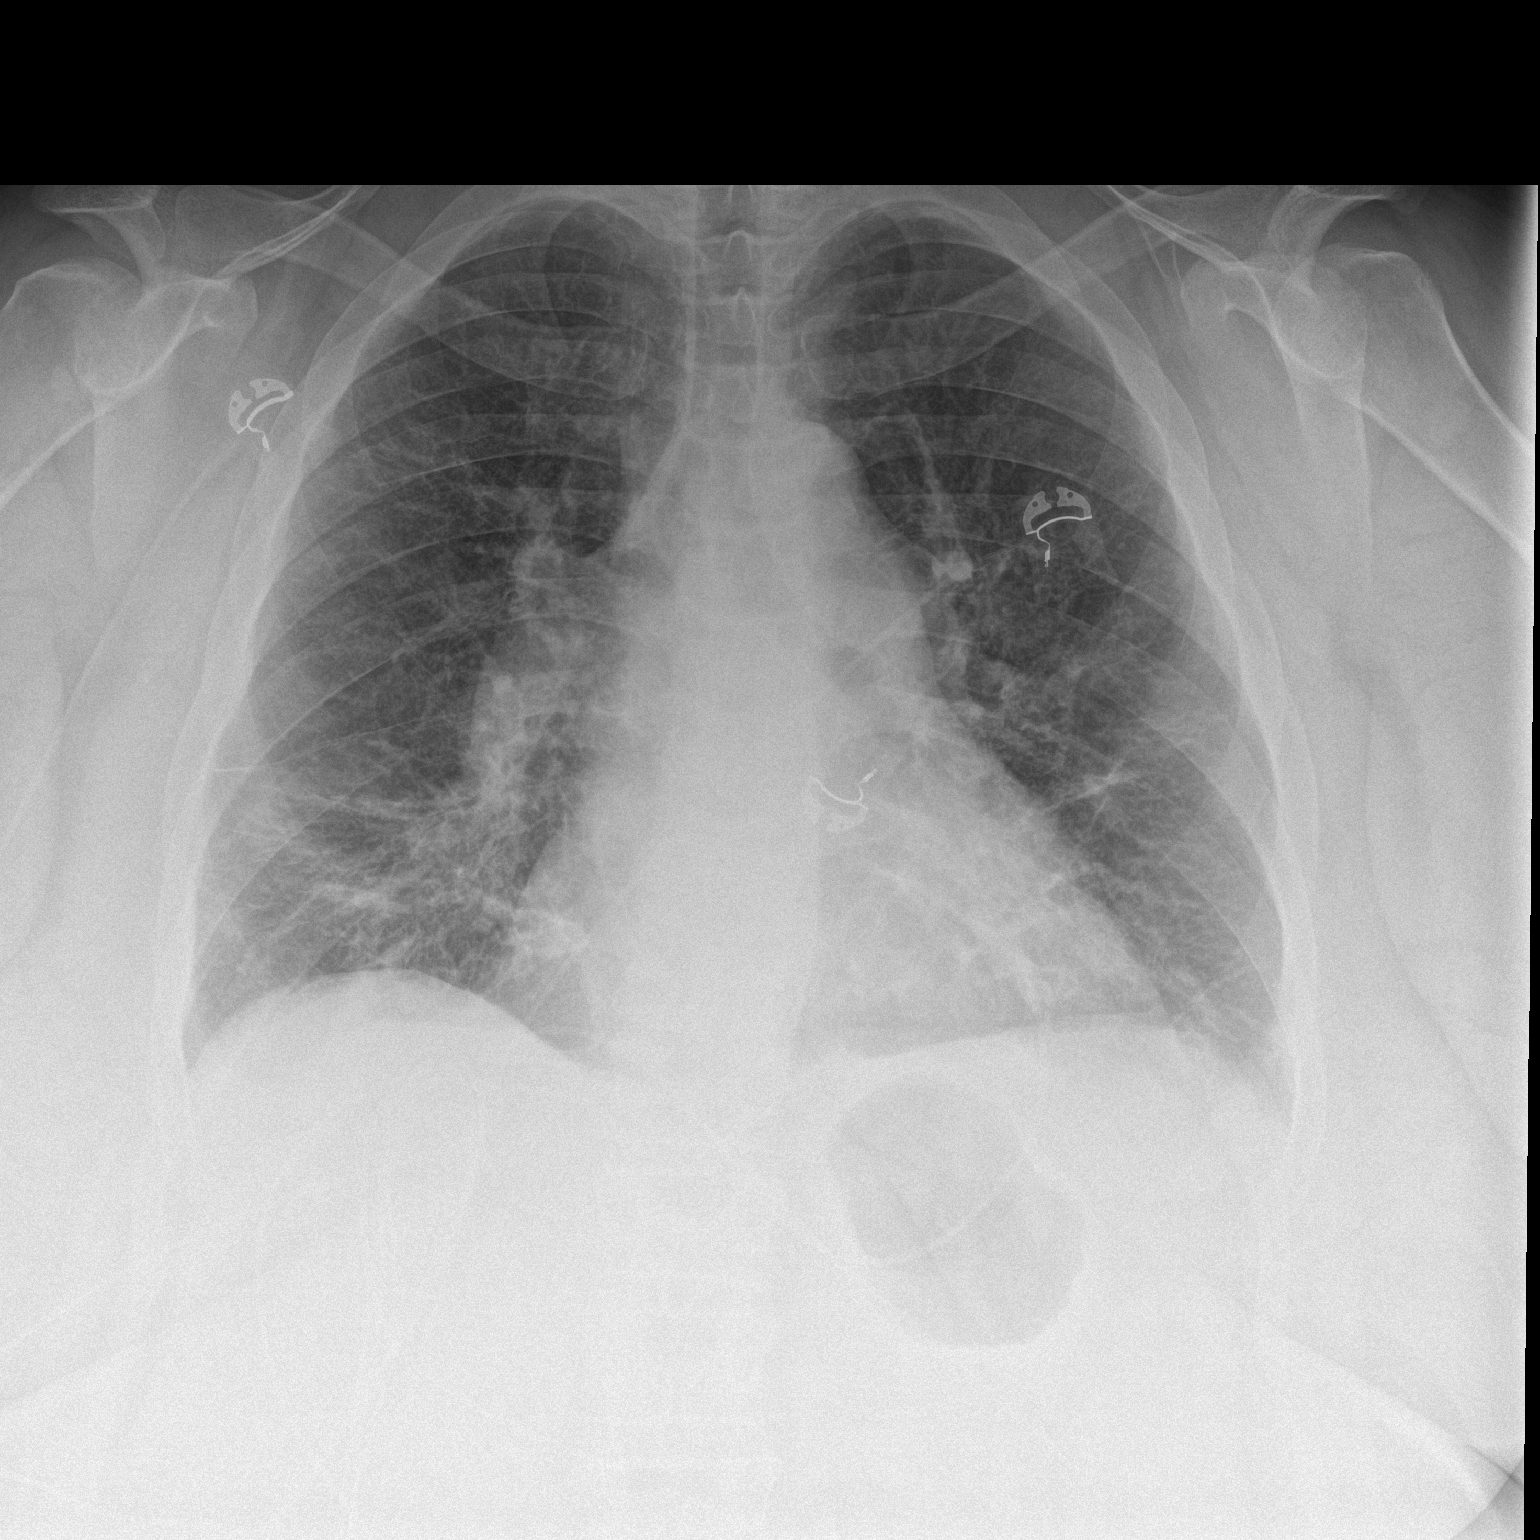

[chest lat]
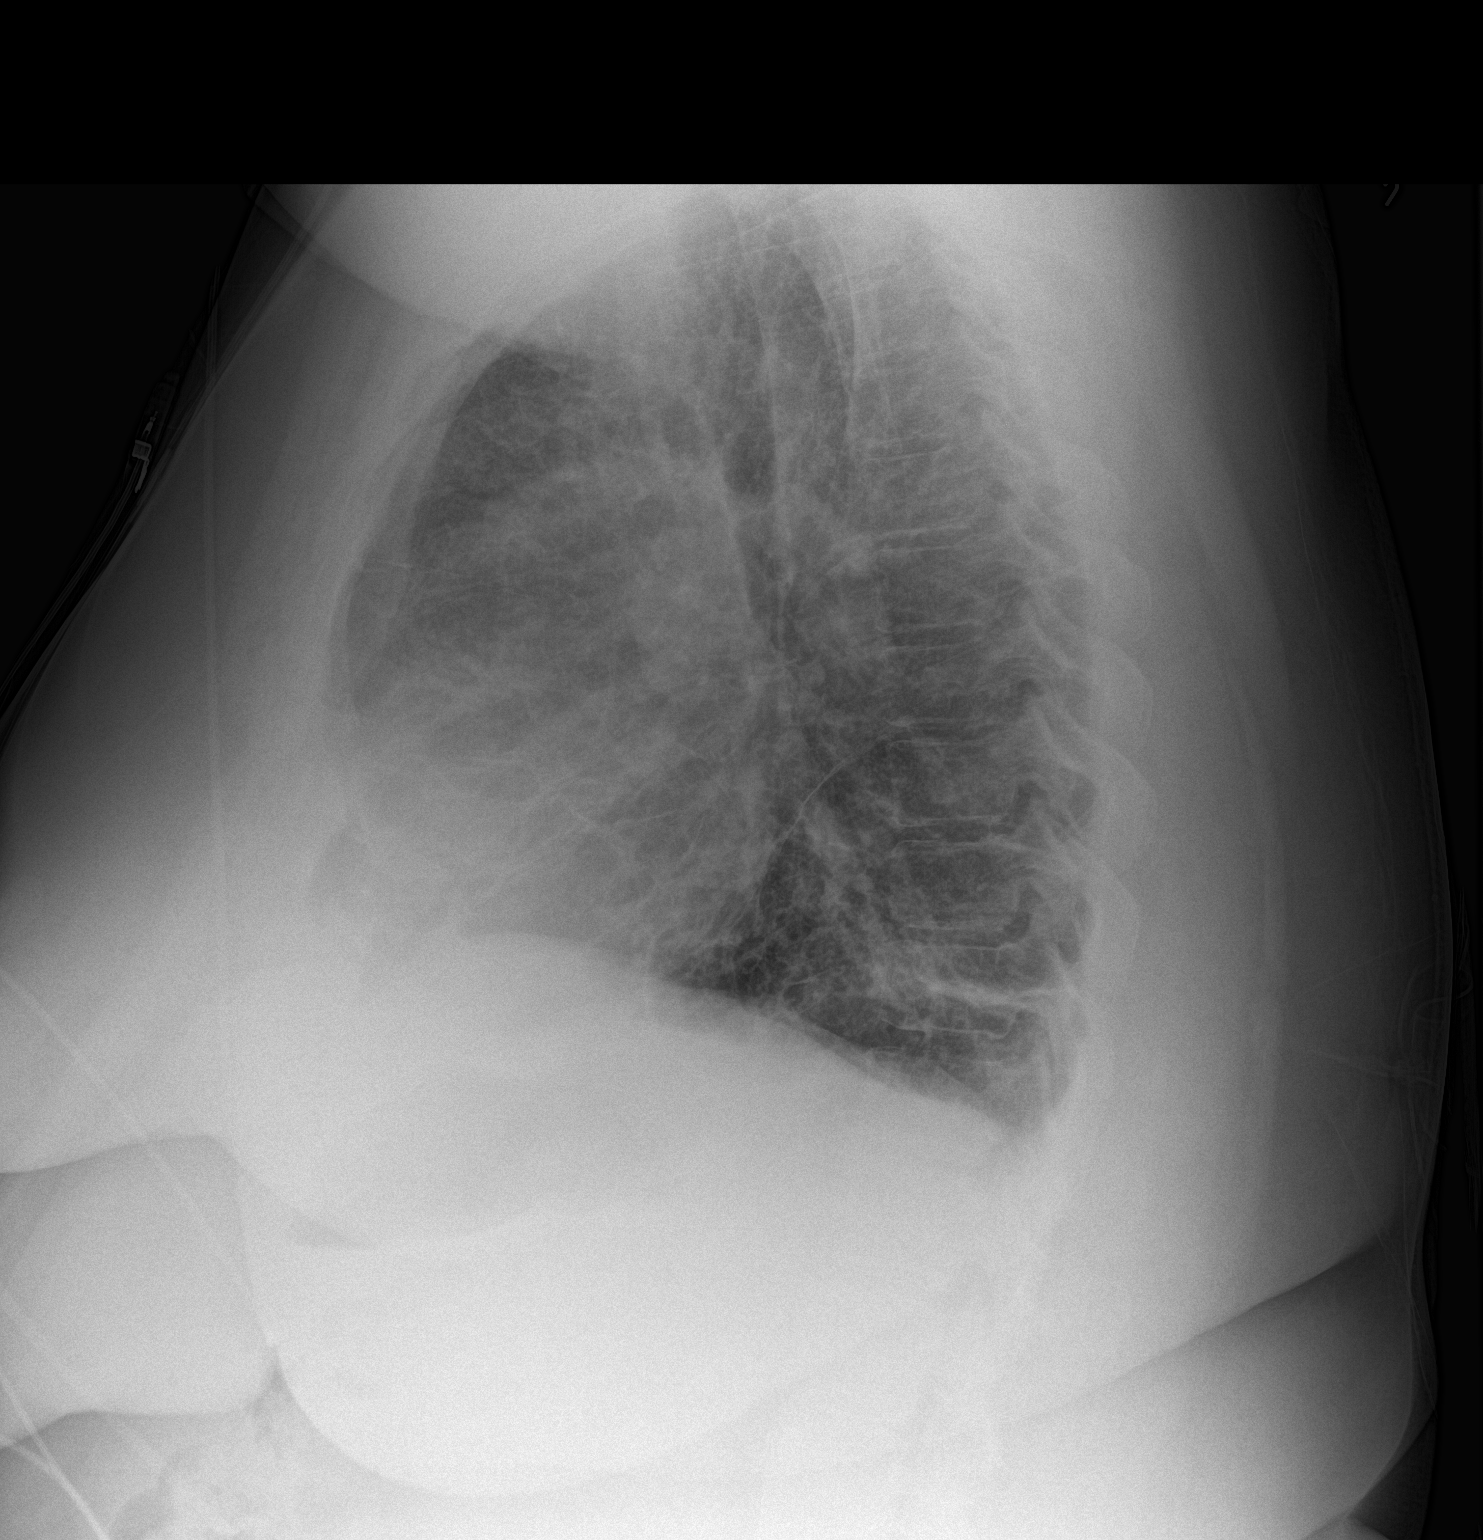

[2 of 2 positions shown; findings below may reference images not displayed]

FINDINGS: Basilar linear scarring is unchanged in this patient with history of
sarcoidosis. No focal infiltrate or effusion is seen. Mediastinal
and hilar contours are unchanged and cardiomegaly is stable. No bony
abnormality is seen.
IMPRESSION: Little change in basilar scarring in this patient with sarcoidosis.
Stable cardiomegaly.
